# Patient Record
Sex: Male | Born: 1975
Health system: Southern US, Community
[De-identification: ages and names within clinical notes are randomized; demographics above are authoritative.]

## PROBLEM LIST (undated history)

## (undated) DIAGNOSIS — E669 Obesity, unspecified: Secondary | ICD-10-CM

## (undated) DIAGNOSIS — T7840XA Allergy, unspecified, initial encounter: Secondary | ICD-10-CM

## (undated) DIAGNOSIS — N189 Chronic kidney disease, unspecified: Secondary | ICD-10-CM

## (undated) DIAGNOSIS — Z86010 Personal history of colonic polyps: Secondary | ICD-10-CM

## (undated) DIAGNOSIS — E119 Type 2 diabetes mellitus without complications: Secondary | ICD-10-CM

## (undated) DIAGNOSIS — E785 Hyperlipidemia, unspecified: Secondary | ICD-10-CM

## (undated) DIAGNOSIS — M1990 Unspecified osteoarthritis, unspecified site: Secondary | ICD-10-CM

## (undated) DIAGNOSIS — I10 Essential (primary) hypertension: Secondary | ICD-10-CM

## (undated) DIAGNOSIS — K219 Gastro-esophageal reflux disease without esophagitis: Secondary | ICD-10-CM

## (undated) DIAGNOSIS — K3184 Gastroparesis: Secondary | ICD-10-CM

## (undated) DIAGNOSIS — E113599 Type 2 diabetes mellitus with proliferative diabetic retinopathy without macular edema, unspecified eye: Secondary | ICD-10-CM

## (undated) DIAGNOSIS — K529 Noninfective gastroenteritis and colitis, unspecified: Secondary | ICD-10-CM

## (undated) DIAGNOSIS — K2970 Gastritis, unspecified, without bleeding: Secondary | ICD-10-CM

## (undated) DIAGNOSIS — G709 Myoneural disorder, unspecified: Secondary | ICD-10-CM

## (undated) DIAGNOSIS — F329 Major depressive disorder, single episode, unspecified: Secondary | ICD-10-CM

## (undated) DIAGNOSIS — E1140 Type 2 diabetes mellitus with diabetic neuropathy, unspecified: Secondary | ICD-10-CM

## (undated) DIAGNOSIS — F32A Depression, unspecified: Secondary | ICD-10-CM

## (undated) HISTORY — PX: EYE SURGERY: SHX253

## (undated) HISTORY — DX: Depression, unspecified: F32.A

## (undated) HISTORY — PX: KNEE SURGERY: SHX244

## (undated) HISTORY — DX: Hyperlipidemia, unspecified: E78.5

## (undated) HISTORY — DX: Noninfective gastroenteritis and colitis, unspecified: K52.9

## (undated) HISTORY — DX: Type 2 diabetes mellitus with proliferative diabetic retinopathy without macular edema, unspecified eye: E11.3599

## (undated) HISTORY — DX: Chronic kidney disease, unspecified: N18.9

## (undated) HISTORY — DX: Allergy, unspecified, initial encounter: T78.40XA

## (undated) HISTORY — DX: Unspecified osteoarthritis, unspecified site: M19.90

## (undated) HISTORY — DX: Major depressive disorder, single episode, unspecified: F32.9

## (undated) HISTORY — DX: Myoneural disorder, unspecified: G70.9

## (undated) HISTORY — DX: Type 2 diabetes mellitus without complications: E11.9

## (undated) HISTORY — PX: CHOLECYSTECTOMY: SHX55

## (undated) HISTORY — DX: Gastroparesis: K31.84

## (undated) HISTORY — PX: KNEE ARTHROSCOPY W/ ACL RECONSTRUCTION: SHX1858

## (undated) HISTORY — DX: Essential (primary) hypertension: I10

---
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
## 2010-02-12 DIAGNOSIS — K3184 Gastroparesis: Secondary | ICD-10-CM

## 2010-02-12 HISTORY — DX: Gastroparesis: K31.84

## 2010-10-25 ENCOUNTER — Emergency Department
Admission: EM | Admit: 2010-10-25 | Disposition: A | Discharge status: Home / Self Care | Payer: Self-pay | Source: Emergency Department | Admitting: Emergency Medicine

## 2010-10-25 LAB — URINALYSIS
Bilirubin, UA: NEGATIVE
Leukocyte Esterase, UA: NEGATIVE
Nitrate: NEGATIVE
Protein, UR: NEGATIVE
Specific Gravity, UR: >1.035 — ABNORMAL HIGH (ref 1.000–1.035)
Urobilinogen, UA: NORMAL
pH, Urine: 6 (ref 5.0–8.0)

## 2010-10-25 LAB — BLOOD GAS ANALYSIS
Base Excess: -0.1 mmol/L (ref <=2.0)
FIO2: 21 %
HCO3, Arterial: 25.4 mmol/L (ref 22.0–28.0)
O2 Sat(c): 97 % (ref 95.0–100.0)
Patient Temp: 98.6
pCO2: 44 mmHg (ref 35–45)
pH: 7.37 (ref 7.35–7.45)
pO2: 96 mmHg (ref 80–100)

## 2010-10-25 LAB — CBC AND DIFFERENTIAL
BASOPHILS %: 0.1 % (ref 0.0–2.0)
Baso(Absolute): 0.01 10*3/uL (ref 0.00–0.20)
Eosinophils %: 0.2 % (ref 0.0–6.0)
Eosinophils Absolute: 0.02 10*3/uL — ABNORMAL LOW (ref 0.10–0.30)
Hematocrit: 43 % (ref 39.0–49.0)
Hemoglobin: 15 g/dL (ref 13.2–17.3)
Immature Granulocytes #: 0.04 10*3/uL (ref 0.00–0.05)
Immature Granulocytes %: 0.4 % — ABNORMAL HIGH (ref 0.0–0.0)
Lymphocytes Absolute: 1.83 10*3/uL (ref 1.00–4.80)
Lymphocytes Relative: 16.7 % — ABNORMAL LOW (ref 25.0–55.0)
MCH: 27.2 pg (ref 27.0–34.0)
MCHC: 34.9 g/dL (ref 32.0–36.0)
MCV: 78 fL — ABNORMAL LOW (ref 80–100)
MPV: 10.4 fL (ref 9.0–13.0)
Monocytes Absolute: 0.41 10*3/uL (ref 0.10–1.20)
Monocytes Relative %: 3.7 % (ref 1.0–8.0)
Neutrophils Absolute: 8.72 10*3/uL — ABNORMAL HIGH (ref 1.80–7.70)
Neutrophils Relative %: 79.3 % — ABNORMAL HIGH (ref 49.0–69.0)
Nucleated RBC %: 0 /100WBC (ref 0.0–0.0)
Nucleted RBC #: 0 10*3/uL (ref 0.00–0.00)
Platelets: 254 10*3/uL (ref 150–400)
RBC: 5.51 M/uL — ABNORMAL HIGH (ref 3.80–5.40)
RDW: 12.2 % (ref 11.0–14.0)
WBC: 10.99 10*3/uL — ABNORMAL HIGH (ref 4.80–10.80)

## 2010-10-25 LAB — COMPREHENSIVE METABOLIC PANEL
ALT: 39 U/L (ref 7–56)
AST (SGOT): 28 U/L (ref 5–40)
Albumin, Synovial: 4.3 g/dL (ref 3.9–5.0)
Alkaline Phosphatase: 124 U/L (ref 38–126)
BUN / Creatinine Ratio: 15 (ref 8–20)
BUN: 16 mg/dL (ref 6–20)
Bilirubin, Total: 1.1 mg/dL (ref 0.2–1.3)
CO2: 22 mmol/L (ref 21.0–31.0)
Calcium: 9.2 mg/dL (ref 8.4–10.2)
Chloride: 102 mmol/L (ref 101–111)
Creatinine: 1.07 mg/dL (ref 0.66–1.25)
EGFR: >60 mL/min/{1.73_m2}
EGFR: >60 mL/min/{1.73_m2}
Glucose: 348 mg/dL — ABNORMAL HIGH (ref 70–100)
Potassium: 4.6 mmol/L (ref 3.6–5.0)
Protein, Total: 7.3 g/dL (ref 6.3–8.2)
Sodium: 140 mmol/L (ref 135–145)

## 2010-10-25 LAB — URINALYSIS WITH MICROSCOPIC

## 2010-10-25 LAB — LIPASE: Lipase: 117 U/L (ref 23–300)

## 2010-10-27 ENCOUNTER — Inpatient Hospital Stay
Admission: EM | Admit: 2010-10-27 | Disposition: A | Discharge status: Home / Self Care | Payer: Self-pay | Source: Emergency Department | Admitting: Family Medicine

## 2010-10-27 LAB — CBC AND DIFFERENTIAL
BASOPHILS %: 0.2 % (ref 0.0–2.0)
Baso(Absolute): 0.02 10*3/uL (ref 0.00–0.20)
Eosinophils %: 0.7 % (ref 0.0–6.0)
Eosinophils Absolute: 0.07 10*3/uL — ABNORMAL LOW (ref 0.10–0.30)
Hematocrit: 43.2 % (ref 39.0–49.0)
Hemoglobin: 15.3 g/dL (ref 13.2–17.3)
Immature Granulocytes #: 0.03 10*3/uL (ref 0.00–0.05)
Immature Granulocytes %: 0.3 % — ABNORMAL HIGH (ref 0.0–0.0)
Lymphocytes Absolute: 2.17 10*3/uL (ref 1.00–4.80)
Lymphocytes Relative: 22.7 % — ABNORMAL LOW (ref 25.0–55.0)
MCH: 27.5 pg (ref 27.0–34.0)
MCHC: 35.4 g/dL (ref 32.0–36.0)
MCV: 77.6 fL — ABNORMAL LOW (ref 80–100)
MPV: 10.3 fL (ref 9.0–13.0)
Monocytes Absolute: 0.54 10*3/uL (ref 0.10–1.20)
Monocytes Relative %: 5.6 % (ref 1.0–8.0)
Neutrophils Absolute: 6.78 10*3/uL (ref 1.80–7.70)
Neutrophils Relative %: 70.8 % — ABNORMAL HIGH (ref 49.0–69.0)
Nucleated RBC %: 0 /100WBC (ref 0.0–0.0)
Nucleted RBC #: 0 10*3/uL (ref 0.00–0.00)
Platelets: 265 10*3/uL (ref 150–400)
RBC: 5.57 M/uL — ABNORMAL HIGH (ref 3.80–5.40)
RDW: 12 % (ref 11.0–14.0)
WBC: 9.58 10*3/uL (ref 4.80–10.80)

## 2010-10-27 LAB — URINALYSIS
Bilirubin, UA: NEGATIVE
Leukocyte Esterase, UA: NEGATIVE
Nitrate: NEGATIVE
Protein, UR: NEGATIVE
Specific Gravity, UR: >1.035 — ABNORMAL HIGH (ref 1.000–1.035)
Urobilinogen, UA: NORMAL
pH, Urine: 6 (ref 5.0–8.0)

## 2010-10-27 LAB — URINALYSIS WITH MICROSCOPIC

## 2010-10-27 LAB — COMPREHENSIVE METABOLIC PANEL
ALT: 54 U/L (ref 7–56)
AST (SGOT): 52 U/L — ABNORMAL HIGH (ref 5–40)
Albumin, Synovial: 4.6 g/dL (ref 3.9–5.0)
Alkaline Phosphatase: 154 U/L — ABNORMAL HIGH (ref 38–126)
BUN / Creatinine Ratio: 13 (ref 8–20)
BUN: 15 mg/dL (ref 6–20)
Bilirubin, Total: 0.8 mg/dL (ref 0.2–1.3)
CO2: 25 mmol/L (ref 21.0–31.0)
Calcium: 9.4 mg/dL (ref 8.4–10.2)
Chloride: 98 mmol/L — ABNORMAL LOW (ref 101–111)
Creatinine: 1.16 mg/dL (ref 0.66–1.25)
EGFR: >60 mL/min/{1.73_m2}
EGFR: >60 mL/min/{1.73_m2}
Glucose: 403 mg/dL — ABNORMAL HIGH (ref 70–100)
Potassium: 4.3 mmol/L (ref 3.6–5.0)
Protein, Total: 7.8 g/dL (ref 6.3–8.2)
Sodium: 137 mmol/L (ref 135–145)

## 2010-10-27 LAB — TOXICOLOGY SCREEN, SERUM
Barbiturate Screen, UR: NEGATIVE
Benzodiazepine Screen, UR: NEGATIVE
Cocaine Screen: NEGATIVE
Opiate Screen: NEGATIVE
PCP Screen: NEGATIVE
THC Screen: NEGATIVE
Urine Amphetamine Screen: NEGATIVE

## 2010-10-27 LAB — TROPONIN I: Troponin I: <0.012 ng/mL (ref 0.000–0.034)

## 2010-10-27 LAB — MYOGLOBIN, SERUM: Myoglobin: 330.5 ng/mL — ABNORMAL HIGH (ref 0.0–121.0)

## 2010-10-27 LAB — LIPASE: Lipase: 272 U/L (ref 23–300)

## 2010-10-27 LAB — CK TOTAL AND CKMB: CKMB Mass: 3.34 ng/mL (ref 0.00–3.38)

## 2010-10-27 LAB — MAGNESIUM: Magnesium: 2.1 mg/dL (ref 1.7–2.2)

## 2010-10-27 LAB — CK: Creatine Kinase (CK): 2418 U/L — ABNORMAL HIGH (ref 19–216)

## 2010-10-28 LAB — URINALYSIS
Bilirubin, UA: NEGATIVE
Blood, UA: NEGATIVE
Leukocyte Esterase, UA: NEGATIVE
Nitrate: NEGATIVE
Protein, UR: NEGATIVE
Specific Gravity, UR: >1.035 — ABNORMAL HIGH (ref 1.000–1.035)
Urobilinogen, UA: NORMAL
pH, Urine: 5.5 (ref 5.0–8.0)

## 2010-10-28 LAB — COMPREHENSIVE METABOLIC PANEL
ALT: 41 U/L (ref 7–56)
AST (SGOT): 27 U/L (ref 5–40)
Albumin, Synovial: 2.8 g/dL — ABNORMAL LOW (ref 3.9–5.0)
Alkaline Phosphatase: 71 U/L (ref 38–126)
BUN / Creatinine Ratio: 15 (ref 8–20)
BUN: 14 mg/dL (ref 6–20)
Bilirubin, Total: 0.8 mg/dL (ref 0.2–1.3)
CO2: 27 mmol/L (ref 21.0–31.0)
Calcium: 7.8 mg/dL — ABNORMAL LOW (ref 8.4–10.2)
Chloride: 108 mmol/L (ref 101–111)
Creatinine: 0.96 mg/dL (ref 0.66–1.25)
EGFR: >60 mL/min/{1.73_m2}
EGFR: >60 mL/min/{1.73_m2}
Glucose: 171 mg/dL — ABNORMAL HIGH (ref 70–100)
Potassium: 4 mmol/L (ref 3.6–5.0)
Protein, Total: 5.6 g/dL — ABNORMAL LOW (ref 6.3–8.2)
Sodium: 141 mmol/L (ref 135–145)

## 2010-10-28 LAB — CK: Creatine Kinase (CK): 647 U/L — ABNORMAL HIGH (ref 19–216)

## 2010-10-28 LAB — CBC
Hematocrit: 38.8 % — ABNORMAL LOW (ref 39.0–49.0)
Hemoglobin: 13 g/dL — ABNORMAL LOW (ref 13.2–17.3)
MCH: 26.6 pg — ABNORMAL LOW (ref 27.0–34.0)
MCHC: 33.5 g/dL (ref 32.0–36.0)
MCV: 79.5 fL — ABNORMAL LOW (ref 80–100)
MPV: 10.8 fL (ref 9.0–13.0)
Nucleated RBC %: 0 /100WBC (ref 0.0–0.0)
Nucleted RBC #: 0 10*3/uL (ref 0.00–0.00)
Platelets: 237 10*3/uL (ref 150–400)
RBC: 4.88 M/uL (ref 3.80–5.40)
RDW: 12.2 % (ref 11.0–14.0)
WBC: 8.63 10*3/uL (ref 4.80–10.80)

## 2010-10-28 LAB — MYOGLOBIN, SERUM: Myoglobin: 65.9 ng/mL (ref 0.0–121.0)

## 2010-10-28 LAB — URIC ACID: Uric acid: 5.6 mg/dL (ref 3.4–7.3)

## 2010-10-29 LAB — CBC
Hematocrit: 39.9 % (ref 39.0–49.0)
Hemoglobin: 13.3 g/dL (ref 13.2–17.3)
MCH: 26.4 pg — ABNORMAL LOW (ref 27.0–34.0)
MCHC: 33.3 g/dL (ref 32.0–36.0)
MCV: 79.2 fL — ABNORMAL LOW (ref 80–100)
MPV: 10.4 fL (ref 9.0–13.0)
Nucleated RBC %: 0 /100WBC (ref 0.0–0.0)
Nucleted RBC #: 0 10*3/uL (ref 0.00–0.00)
Platelets: 249 10*3/uL (ref 150–400)
RBC: 5.04 M/uL (ref 3.80–5.40)
RDW: 12.3 % (ref 11.0–14.0)
WBC: 8.01 10*3/uL (ref 4.80–10.80)

## 2010-10-29 LAB — COMPREHENSIVE METABOLIC PANEL
ALT: 41 U/L (ref 7–56)
AST (SGOT): 20 U/L (ref 5–40)
Albumin, Synovial: 2.8 g/dL — ABNORMAL LOW (ref 3.9–5.0)
Alkaline Phosphatase: 71 U/L (ref 38–126)
BUN / Creatinine Ratio: 18 (ref 8–20)
BUN: 18 mg/dL (ref 6–20)
Bilirubin, Total: 0.7 mg/dL (ref 0.2–1.3)
CO2: 26 mmol/L (ref 21.0–31.0)
Calcium: 7.8 mg/dL — ABNORMAL LOW (ref 8.4–10.2)
Chloride: 109 mmol/L (ref 101–111)
Creatinine: 0.99 mg/dL (ref 0.66–1.25)
EGFR: >60 mL/min/{1.73_m2}
EGFR: >60 mL/min/{1.73_m2}
Glucose: 165 mg/dL — ABNORMAL HIGH (ref 70–100)
Potassium: 4.1 mmol/L (ref 3.6–5.0)
Protein, Total: 5.6 g/dL — ABNORMAL LOW (ref 6.3–8.2)
Sodium: 142 mmol/L (ref 135–145)

## 2010-10-29 LAB — CK: Creatine Kinase (CK): 278 U/L — ABNORMAL HIGH (ref 19–216)

## 2010-10-29 LAB — URINE MYOGLOBIN: Myoglobin, UR: <1 mg/L (ref 0–1)

## 2010-10-29 LAB — AMYLASE: Amylase: <30 U/L (ref 29–110)

## 2010-10-29 LAB — LIPASE: Lipase: 154 U/L (ref 23–300)

## 2010-10-30 LAB — H. PYLORI (C-13), BLOOD: Helicobacter pylori Screen: NEGATIVE

## 2010-11-21 ENCOUNTER — Inpatient Hospital Stay
Admission: EM | Admit: 2010-11-21 | Disposition: A | Discharge status: Home / Self Care | Payer: Self-pay | Source: Emergency Department | Attending: Internal Medicine | Admitting: Internal Medicine

## 2010-11-21 LAB — CBC AND DIFFERENTIAL
BASOPHILS %: 0.1 % (ref 0.0–2.0)
Baso(Absolute): 0.01 10*3/uL (ref 0.00–0.20)
Eosinophils %: 0 % (ref 0.0–6.0)
Eosinophils Absolute: 0 10*3/uL — ABNORMAL LOW (ref 0.10–0.30)
Hematocrit: 49 % (ref 39.0–49.0)
Hemoglobin: 17.1 g/dL (ref 13.2–17.3)
Immature Granulocytes #: 0.07 10*3/uL — ABNORMAL HIGH (ref 0.00–0.05)
Immature Granulocytes %: 0.5 % — ABNORMAL HIGH (ref 0.0–0.0)
Lymphocytes Absolute: 1.55 10*3/uL (ref 1.00–4.80)
Lymphocytes Relative: 10.7 % — ABNORMAL LOW (ref 25.0–55.0)
MCH: 27.5 pg (ref 27.0–34.0)
MCHC: 34.9 g/dL (ref 32.0–36.0)
MCV: 78.9 fL — ABNORMAL LOW (ref 80–100)
MPV: 10.5 fL (ref 9.0–13.0)
Monocytes Absolute: 0.69 10*3/uL (ref 0.10–1.20)
Monocytes Relative %: 4.7 % (ref 1.0–8.0)
Neutrophils Absolute: 12.3 10*3/uL — ABNORMAL HIGH (ref 1.80–7.70)
Neutrophils Relative %: 84.5 % — ABNORMAL HIGH (ref 49.0–69.0)
Nucleated RBC %: 0 /100WBC (ref 0.0–0.0)
Nucleted RBC #: 0 10*3/uL (ref 0.00–0.00)
Platelets: 305 10*3/uL (ref 150–400)
RBC: 6.21 M/uL — ABNORMAL HIGH (ref 3.80–5.40)
RDW: 12.4 % (ref 11.0–14.0)
WBC: 14.55 10*3/uL — ABNORMAL HIGH (ref 4.80–10.80)

## 2010-11-21 LAB — PT (PROTHROMBIN TIME)
PT INR: 1
PT: 13.1 s (ref 12.6–15.0)

## 2010-11-21 LAB — COMPREHENSIVE METABOLIC PANEL
ALT: 37 U/L (ref 7–56)
AST (SGOT): 17 U/L (ref 5–40)
Albumin, Synovial: 4.7 g/dL (ref 3.9–5.0)
Alkaline Phosphatase: 130 U/L — ABNORMAL HIGH (ref 38–126)
BUN / Creatinine Ratio: 18 (ref 8–20)
BUN: 27 mg/dL — ABNORMAL HIGH (ref 6–20)
Bilirubin, Total: 0.9 mg/dL (ref 0.2–1.3)
CO2: 22 mmol/L (ref 21.0–31.0)
Calcium: 10.1 mg/dL (ref 8.4–10.2)
Chloride: 102 mmol/L (ref 101–111)
Creatinine: 1.48 mg/dL — ABNORMAL HIGH (ref 0.66–1.25)
EGFR: 54 mL/min/{1.73_m2}
EGFR: >60 mL/min/{1.73_m2}
Glucose: 424 mg/dL — ABNORMAL HIGH (ref 70–100)
Potassium: 4.6 mmol/L (ref 3.6–5.0)
Protein, Total: 8.3 g/dL — ABNORMAL HIGH (ref 6.3–8.2)
Sodium: 142 mmol/L (ref 135–145)

## 2010-11-21 LAB — CK: Creatine Kinase (CK): 120 U/L (ref 19–216)

## 2010-11-21 LAB — PTT (PARTIAL THROMBOPLASTIN TIME): PTT: 23.6 s (ref 23.0–37.0)

## 2010-11-21 LAB — LIPASE: Lipase: 122 U/L (ref 23–300)

## 2010-11-21 LAB — TROPONIN I: Troponin I: <0.012 ng/mL (ref 0.000–0.034)

## 2010-11-21 LAB — ALCOHOL, SERUM: Alcohol Screen Serum: <10 mg/dL (ref 0–50)

## 2010-11-22 LAB — URINALYSIS
Bilirubin, UA: NEGATIVE
Blood, UA: NEGATIVE
Leukocyte Esterase, UA: NEGATIVE
Nitrate: NEGATIVE
Protein, UR: NEGATIVE
Specific Gravity, UR: >1.035 — ABNORMAL HIGH (ref 1.000–1.035)
Urobilinogen, UA: NORMAL
pH, Urine: 5.5 (ref 5.0–8.0)

## 2010-11-22 LAB — CBC
Hematocrit: 41.6 % (ref 39.0–49.0)
Hemoglobin: 14.1 g/dL (ref 13.2–17.3)
MCH: 27 pg (ref 27.0–34.0)
MCHC: 33.9 g/dL (ref 32.0–36.0)
MCV: 79.5 fL — ABNORMAL LOW (ref 80–100)
MPV: 10 fL (ref 9.0–13.0)
Nucleated RBC %: 0 /100WBC (ref 0.0–0.0)
Nucleted RBC #: 0 10*3/uL (ref 0.00–0.00)
Platelets: 260 10*3/uL (ref 150–400)
RBC: 5.23 M/uL (ref 3.80–5.40)
RDW: 12.5 % (ref 11.0–14.0)
WBC: 12.8 10*3/uL — ABNORMAL HIGH (ref 4.80–10.80)

## 2010-11-22 LAB — BASIC METABOLIC PANEL
BUN / Creatinine Ratio: 23 — ABNORMAL HIGH (ref 8–20)
BUN: 27 mg/dL — ABNORMAL HIGH (ref 6–20)
CO2: 23 mmol/L (ref 21.0–31.0)
Calcium: 8.3 mg/dL — ABNORMAL LOW (ref 8.4–10.2)
Chloride: 113 mmol/L — ABNORMAL HIGH (ref 101–111)
Creatinine: 1.16 mg/dL (ref 0.66–1.25)
EGFR: >60 mL/min/{1.73_m2}
EGFR: >60 mL/min/{1.73_m2}
Glucose: 193 mg/dL — ABNORMAL HIGH (ref 70–100)
Potassium: 4.2 mmol/L (ref 3.6–5.0)
Sodium: 145 mmol/L (ref 135–145)

## 2010-11-22 LAB — MAGNESIUM: Magnesium: 2.1 mg/dL (ref 1.7–2.2)

## 2010-11-22 LAB — CK: Creatine Kinase (CK): 106 U/L (ref 19–216)

## 2010-11-22 LAB — TROPONIN I: Troponin I: <0.012 ng/mL (ref 0.000–0.034)

## 2010-11-23 LAB — COMPREHENSIVE METABOLIC PANEL
ALT: 31 U/L (ref 7–56)
AST (SGOT): 12 U/L (ref 5–40)
Albumin, Synovial: 3.2 g/dL — ABNORMAL LOW (ref 3.9–5.0)
Alkaline Phosphatase: 77 U/L (ref 38–126)
BUN / Creatinine Ratio: 17 (ref 8–20)
BUN: 15 mg/dL (ref 6–20)
Bilirubin, Total: 0.7 mg/dL (ref 0.2–1.3)
CO2: 25 mmol/L (ref 21.0–31.0)
Calcium: 8.3 mg/dL — ABNORMAL LOW (ref 8.4–10.2)
Chloride: 109 mmol/L (ref 101–111)
Creatinine: 0.92 mg/dL (ref 0.66–1.25)
EGFR: >60 mL/min/{1.73_m2}
EGFR: >60 mL/min/{1.73_m2}
Glucose: 176 mg/dL — ABNORMAL HIGH (ref 70–100)
Potassium: 4.1 mmol/L (ref 3.6–5.0)
Protein, Total: 6.2 g/dL — ABNORMAL LOW (ref 6.3–8.2)
Sodium: 144 mmol/L (ref 135–145)

## 2010-11-23 LAB — PHOSPHORUS: Phosphorus: 3.4 mg/dL (ref 2.4–4.4)

## 2010-11-23 LAB — LIPASE: Lipase: 158 U/L (ref 23–300)

## 2010-11-23 LAB — MAGNESIUM: Magnesium: 1.9 mg/dL (ref 1.7–2.2)

## 2010-11-24 LAB — BASIC METABOLIC PANEL
BUN / Creatinine Ratio: 12 (ref 8–20)
BUN: 11 mg/dL (ref 6–20)
CO2: 23 mmol/L (ref 21.0–31.0)
Calcium: 8.6 mg/dL (ref 8.4–10.2)
Chloride: 107 mmol/L (ref 101–111)
Creatinine: 0.93 mg/dL (ref 0.66–1.25)
EGFR: >60 mL/min/{1.73_m2}
EGFR: >60 mL/min/{1.73_m2}
Glucose: 181 mg/dL — ABNORMAL HIGH (ref 70–100)
Potassium: 4.1 mmol/L (ref 3.6–5.0)
Sodium: 142 mmol/L (ref 135–145)

## 2010-11-24 LAB — CBC AND DIFFERENTIAL
BASOPHILS %: 0.3 % (ref 0.0–2.0)
Baso(Absolute): 0.02 10*3/uL (ref 0.00–0.20)
Eosinophils %: 0.1 % (ref 0.0–6.0)
Eosinophils Absolute: 0.01 10*3/uL — ABNORMAL LOW (ref 0.10–0.30)
Hematocrit: 43.3 % (ref 39.0–49.0)
Hemoglobin: 14.6 g/dL (ref 13.2–17.3)
Immature Granulocytes #: 0.02 10*3/uL (ref 0.00–0.05)
Immature Granulocytes %: 0.3 % — ABNORMAL HIGH (ref 0.0–0.0)
Lymphocytes Absolute: 1.97 10*3/uL (ref 1.00–4.80)
Lymphocytes Relative: 28 % (ref 25.0–55.0)
MCH: 27 pg (ref 27.0–34.0)
MCHC: 33.7 g/dL (ref 32.0–36.0)
MCV: 80.2 fL (ref 80–100)
MPV: 10.2 fL (ref 9.0–13.0)
Monocytes Absolute: 0.32 10*3/uL (ref 0.10–1.20)
Monocytes Relative %: 4.5 % (ref 1.0–8.0)
Neutrophils Absolute: 4.72 10*3/uL (ref 1.80–7.70)
Neutrophils Relative %: 67.1 % (ref 49.0–69.0)
Nucleated RBC %: 0 /100WBC (ref 0.0–0.0)
Nucleted RBC #: 0 10*3/uL (ref 0.00–0.00)
Platelets: 236 10*3/uL (ref 150–400)
RBC: 5.4 M/uL (ref 3.80–5.40)
RDW: 12.2 % (ref 11.0–14.0)
WBC: 7.04 10*3/uL (ref 4.80–10.80)

## 2010-11-24 LAB — GLYCOHEMOGLOBIN
Estimated Average Glucose: 338 mg/dL
Hemoglobin A1C: 13.4 % — ABNORMAL HIGH (ref 4.0–5.6)

## 2010-11-24 LAB — MAGNESIUM: Magnesium: 1.8 mg/dL (ref 1.7–2.2)

## 2010-11-27 LAB — CBC
Hematocrit: 39.1 % (ref 39.0–49.0)
Hemoglobin: 13.8 g/dL (ref 13.2–17.3)
MCH: 27 pg (ref 27.0–34.0)
MCHC: 35.3 g/dL (ref 32.0–36.0)
MCV: 76.5 fL — ABNORMAL LOW (ref 80–100)
MPV: 9.9 fL (ref 9.0–13.0)
Nucleated RBC %: 0 /100WBC (ref 0.0–0.0)
Nucleted RBC #: 0 10*3/uL (ref 0.00–0.00)
Platelets: 211 10*3/uL (ref 150–400)
RBC: 5.11 M/uL (ref 3.80–5.40)
RDW: 11.7 % (ref 11.0–14.0)
WBC: 6.28 10*3/uL (ref 4.80–10.80)

## 2010-11-27 LAB — COMPREHENSIVE METABOLIC PANEL
ALT: 33 U/L (ref 7–56)
AST (SGOT): 21 U/L (ref 5–40)
Albumin, Synovial: 3 g/dL — ABNORMAL LOW (ref 3.9–5.0)
Alkaline Phosphatase: 64 U/L (ref 38–126)
BUN / Creatinine Ratio: 10 (ref 8–20)
BUN: 8 mg/dL (ref 6–20)
Bilirubin, Total: 0.9 mg/dL (ref 0.2–1.3)
CO2: 26 mmol/L (ref 21.0–31.0)
Calcium: 8.6 mg/dL (ref 8.4–10.2)
Chloride: 98 mmol/L — ABNORMAL LOW (ref 101–111)
Creatinine: 0.86 mg/dL (ref 0.66–1.25)
EGFR: >60 mL/min/{1.73_m2}
EGFR: >60 mL/min/{1.73_m2}
Glucose: 109 mg/dL — ABNORMAL HIGH (ref 70–100)
Potassium: 3.1 mmol/L — ABNORMAL LOW (ref 3.6–5.0)
Protein, Total: 5.9 g/dL — ABNORMAL LOW (ref 6.3–8.2)
Sodium: 134 mmol/L — ABNORMAL LOW (ref 135–145)

## 2010-11-27 LAB — PHOSPHORUS: Phosphorus: 3.5 mg/dL (ref 2.4–4.4)

## 2010-11-27 LAB — MAGNESIUM: Magnesium: 1.6 mg/dL — ABNORMAL LOW (ref 1.7–2.2)

## 2010-11-28 LAB — BASIC METABOLIC PANEL
BUN / Creatinine Ratio: 9 (ref 8–20)
BUN: 9 mg/dL (ref 6–20)
CO2: 26 mmol/L (ref 21.0–31.0)
Calcium: 8.8 mg/dL (ref 8.4–10.2)
Chloride: 96 mmol/L — ABNORMAL LOW (ref 101–111)
Creatinine: 1.02 mg/dL (ref 0.66–1.25)
EGFR: >60 mL/min/{1.73_m2}
EGFR: >60 mL/min/{1.73_m2}
Glucose: 207 mg/dL — ABNORMAL HIGH (ref 70–100)
Potassium: 3.2 mmol/L — ABNORMAL LOW (ref 3.6–5.0)
Sodium: 134 mmol/L — ABNORMAL LOW (ref 135–145)

## 2010-11-29 LAB — CBC AND DIFFERENTIAL
BASOPHILS %: 0.3 % (ref 0.0–2.0)
Baso(Absolute): 0.02 10*3/uL (ref 0.00–0.20)
Eosinophils %: 2.9 % (ref 0.0–6.0)
Eosinophils Absolute: 0.22 10*3/uL (ref 0.10–0.30)
Hematocrit: 40.9 % (ref 39.0–49.0)
Hemoglobin: 14.4 g/dL (ref 13.2–17.3)
Immature Granulocytes #: 0.03 10*3/uL (ref 0.00–0.05)
Immature Granulocytes %: 0.4 % — ABNORMAL HIGH (ref 0.0–0.0)
Lymphocytes Absolute: 1.45 10*3/uL (ref 1.00–4.80)
Lymphocytes Relative: 19.2 % — ABNORMAL LOW (ref 25.0–55.0)
MCH: 26.9 pg — ABNORMAL LOW (ref 27.0–34.0)
MCHC: 35.2 g/dL (ref 32.0–36.0)
MCV: 76.3 fL — ABNORMAL LOW (ref 80–100)
MPV: 10.6 fL (ref 9.0–13.0)
Monocytes Absolute: 0.56 10*3/uL (ref 0.10–1.20)
Monocytes Relative %: 7.4 % (ref 1.0–8.0)
Neutrophils Absolute: 5.32 10*3/uL (ref 1.80–7.70)
Neutrophils Relative %: 70.2 % — ABNORMAL HIGH (ref 49.0–69.0)
Nucleated RBC %: 0 /100WBC (ref 0.0–0.0)
Nucleted RBC #: 0 10*3/uL (ref 0.00–0.00)
Platelets: 231 10*3/uL (ref 150–400)
RBC: 5.36 M/uL (ref 3.80–5.40)
RDW: 11.9 % (ref 11.0–14.0)
WBC: 7.57 10*3/uL (ref 4.80–10.80)

## 2010-11-29 LAB — COMPREHENSIVE METABOLIC PANEL
ALT: 38 U/L (ref 7–56)
AST (SGOT): 26 U/L (ref 5–40)
Albumin, Synovial: 3.1 g/dL — ABNORMAL LOW (ref 3.9–5.0)
Alkaline Phosphatase: 69 U/L (ref 38–126)
BUN / Creatinine Ratio: 8 (ref 8–20)
BUN: 9 mg/dL (ref 6–20)
Bilirubin, Total: 0.7 mg/dL (ref 0.2–1.3)
CO2: 28 mmol/L (ref 21.0–31.0)
Calcium: 8.7 mg/dL (ref 8.4–10.2)
Chloride: 97 mmol/L — ABNORMAL LOW (ref 101–111)
Creatinine: 1.05 mg/dL (ref 0.66–1.25)
EGFR: >60 mL/min/{1.73_m2}
EGFR: >60 mL/min/{1.73_m2}
Glucose: 151 mg/dL — ABNORMAL HIGH (ref 70–100)
Potassium: 3.5 mmol/L — ABNORMAL LOW (ref 3.6–5.0)
Protein, Total: 6.1 g/dL — ABNORMAL LOW (ref 6.3–8.2)
Sodium: 137 mmol/L (ref 135–145)

## 2010-11-29 LAB — PHOSPHORUS: Phosphorus: 3.8 mg/dL (ref 2.4–4.4)

## 2010-11-29 LAB — MAGNESIUM: Magnesium: 1.7 mg/dL (ref 1.7–2.2)

## 2010-11-29 LAB — AMYLASE: Amylase: 32 U/L (ref 29–110)

## 2010-11-29 LAB — LIPASE: Lipase: 187 U/L (ref 23–300)

## 2010-11-30 LAB — CBC AND DIFFERENTIAL
BASOPHILS %: 0.5 % (ref 0.0–2.0)
Baso(Absolute): 0.03 10*3/uL (ref 0.00–0.20)
Eosinophils %: 3.4 % (ref 0.0–6.0)
Eosinophils Absolute: 0.22 10*3/uL (ref 0.10–0.30)
Hematocrit: 42.5 % (ref 39.0–49.0)
Hemoglobin: 14.7 g/dL (ref 13.2–17.3)
Immature Granulocytes #: 0.04 10*3/uL (ref 0.00–0.05)
Immature Granulocytes %: 0.6 % — ABNORMAL HIGH (ref 0.0–0.0)
Lymphocytes Absolute: 2.02 10*3/uL (ref 1.00–4.80)
Lymphocytes Relative: 31.1 % (ref 25.0–55.0)
MCH: 26.8 pg — ABNORMAL LOW (ref 27.0–34.0)
MCHC: 34.6 g/dL (ref 32.0–36.0)
MCV: 77.6 fL — ABNORMAL LOW (ref 80–100)
MPV: 9.9 fL (ref 9.0–13.0)
Monocytes Absolute: 0.67 10*3/uL (ref 0.10–1.20)
Monocytes Relative %: 10.3 % — ABNORMAL HIGH (ref 1.0–8.0)
Neutrophils Absolute: 3.55 10*3/uL (ref 1.80–7.70)
Neutrophils Relative %: 54.7 % (ref 49.0–69.0)
Nucleated RBC %: 0 /100WBC (ref 0.0–0.0)
Nucleted RBC #: 0 10*3/uL (ref 0.00–0.00)
Platelets: 222 10*3/uL (ref 150–400)
RBC: 5.48 M/uL — ABNORMAL HIGH (ref 3.80–5.40)
RDW: 12.1 % (ref 11.0–14.0)
WBC: 6.49 10*3/uL (ref 4.80–10.80)

## 2010-11-30 LAB — BASIC METABOLIC PANEL
BUN / Creatinine Ratio: 9 (ref 8–20)
BUN: 9 mg/dL (ref 6–20)
CO2: 27 mmol/L (ref 21.0–31.0)
Calcium: 8.8 mg/dL (ref 8.4–10.2)
Chloride: 100 mmol/L — ABNORMAL LOW (ref 101–111)
Creatinine: 1.01 mg/dL (ref 0.66–1.25)
EGFR: >60 mL/min/{1.73_m2}
EGFR: >60 mL/min/{1.73_m2}
Glucose: 107 mg/dL — ABNORMAL HIGH (ref 70–100)
Potassium: 3.6 mmol/L (ref 3.6–5.0)
Sodium: 136 mmol/L (ref 135–145)

## 2010-11-30 LAB — MAGNESIUM: Magnesium: 1.6 mg/dL — ABNORMAL LOW (ref 1.7–2.2)

## 2010-12-04 ENCOUNTER — Inpatient Hospital Stay
Admission: EM | Admit: 2010-12-04 | Disposition: A | Discharge status: Home / Self Care | Payer: Self-pay | Source: Emergency Department | Admitting: Internal Medicine

## 2010-12-04 LAB — LIPASE: Lipase: 205 U/L (ref 23–300)

## 2010-12-04 LAB — COMPREHENSIVE METABOLIC PANEL
ALT: 37 U/L (ref 7–56)
AST (SGOT): 18 U/L (ref 5–40)
Albumin, Synovial: 4.7 g/dL (ref 3.9–5.0)
Alkaline Phosphatase: 124 U/L (ref 38–126)
BUN / Creatinine Ratio: 11 (ref 8–20)
BUN: 13 mg/dL (ref 6–20)
Bilirubin, Total: 0.7 mg/dL (ref 0.2–1.3)
CO2: 29 mmol/L (ref 21.0–31.0)
Calcium: 10.2 mg/dL (ref 8.4–10.2)
Chloride: 93 mmol/L — ABNORMAL LOW (ref 101–111)
Creatinine: 1.15 mg/dL (ref 0.66–1.25)
EGFR: >60 mL/min/{1.73_m2}
EGFR: >60 mL/min/{1.73_m2}
Glucose: 370 mg/dL — ABNORMAL HIGH (ref 70–100)
Potassium: 5.6 mmol/L — ABNORMAL HIGH (ref 3.6–5.0)
Protein, Total: 8.2 g/dL (ref 6.3–8.2)
Sodium: 137 mmol/L (ref 135–145)

## 2010-12-04 LAB — CBC AND DIFFERENTIAL
BASOPHILS %: 0.4 % (ref 0.0–2.0)
Baso(Absolute): 0.04 10*3/uL (ref 0.00–0.20)
Eosinophils %: 0 % (ref 0.0–6.0)
Eosinophils Absolute: 0 10*3/uL — ABNORMAL LOW (ref 0.10–0.30)
Hematocrit: 48.5 % (ref 39.0–49.0)
Hemoglobin: 16.6 g/dL (ref 13.2–17.3)
Immature Granulocytes #: 0.08 10*3/uL — ABNORMAL HIGH (ref 0.00–0.05)
Immature Granulocytes %: 0.8 % — ABNORMAL HIGH (ref 0.0–0.0)
Lymphocytes Absolute: 1.43 10*3/uL (ref 1.00–4.80)
Lymphocytes Relative: 14.1 % — ABNORMAL LOW (ref 25.0–55.0)
MCH: 26.9 pg — ABNORMAL LOW (ref 27.0–34.0)
MCHC: 34.2 g/dL (ref 32.0–36.0)
MCV: 78.7 fL — ABNORMAL LOW (ref 80–100)
MPV: 10.7 fL (ref 9.0–13.0)
Monocytes Absolute: 0.41 10*3/uL (ref 0.10–1.20)
Monocytes Relative %: 4.1 % (ref 1.0–8.0)
Neutrophils Absolute: 8.24 10*3/uL — ABNORMAL HIGH (ref 1.80–7.70)
Neutrophils Relative %: 81.4 % — ABNORMAL HIGH (ref 49.0–69.0)
Nucleated RBC %: 0 /100WBC (ref 0.0–0.0)
Nucleted RBC #: 0 10*3/uL (ref 0.00–0.00)
Platelets: 312 10*3/uL (ref 150–400)
RBC: 6.16 M/uL — ABNORMAL HIGH (ref 3.80–5.40)
RDW: 12.5 % (ref 11.0–14.0)
WBC: 10.12 10*3/uL (ref 4.80–10.80)

## 2010-12-04 LAB — URINALYSIS
Bilirubin, UA: NEGATIVE
Blood, UA: NEGATIVE
Leukocyte Esterase, UA: NEGATIVE
Nitrate: NEGATIVE
Protein, UR: NEGATIVE
Specific Gravity, UR: 1.031 (ref 1.000–1.035)
Urobilinogen, UA: NORMAL
pH, Urine: 7.5 (ref 5.0–8.0)

## 2010-12-05 LAB — COMPREHENSIVE METABOLIC PANEL
ALT: 31 U/L (ref 7–56)
AST (SGOT): 12 U/L (ref 5–40)
Albumin, Synovial: 3.4 g/dL — ABNORMAL LOW (ref 3.9–5.0)
Alkaline Phosphatase: 77 U/L (ref 38–126)
BUN / Creatinine Ratio: 12 (ref 8–20)
BUN: 12 mg/dL (ref 6–20)
Bilirubin, Total: 0.7 mg/dL (ref 0.2–1.3)
CO2: 27 mmol/L (ref 21.0–31.0)
Calcium: 8.9 mg/dL (ref 8.4–10.2)
Chloride: 102 mmol/L (ref 101–111)
Creatinine: 1 mg/dL (ref 0.66–1.25)
EGFR: >60 mL/min/{1.73_m2}
EGFR: >60 mL/min/{1.73_m2}
Glucose: 202 mg/dL — ABNORMAL HIGH (ref 70–100)
Potassium: 3.9 mmol/L (ref 3.6–5.0)
Protein, Total: 6.1 g/dL — ABNORMAL LOW (ref 6.3–8.2)
Sodium: 140 mmol/L (ref 135–145)

## 2010-12-05 LAB — CBC
Hematocrit: 42 % (ref 39.0–49.0)
Hemoglobin: 14.1 g/dL (ref 13.2–17.3)
MCH: 26.9 pg — ABNORMAL LOW (ref 27.0–34.0)
MCHC: 33.6 g/dL (ref 32.0–36.0)
MCV: 80 fL (ref 80–100)
MPV: 10.1 fL (ref 9.0–13.0)
Nucleated RBC %: 0 /100WBC (ref 0.0–0.0)
Nucleted RBC #: 0 10*3/uL (ref 0.00–0.00)
Platelets: 325 10*3/uL (ref 150–400)
RBC: 5.25 M/uL (ref 3.80–5.40)
RDW: 12.7 % (ref 11.0–14.0)
WBC: 10.94 10*3/uL — ABNORMAL HIGH (ref 4.80–10.80)

## 2010-12-05 LAB — MAGNESIUM: Magnesium: 1.9 mg/dL (ref 1.7–2.2)

## 2010-12-06 LAB — COMPREHENSIVE METABOLIC PANEL
ALT: 34 U/L (ref 7–56)
AST (SGOT): 13 U/L (ref 5–40)
Albumin, Synovial: 2.8 g/dL — ABNORMAL LOW (ref 3.9–5.0)
Alkaline Phosphatase: 60 U/L (ref 38–126)
BUN / Creatinine Ratio: 10 (ref 8–20)
BUN: 10 mg/dL (ref 6–20)
Bilirubin, Total: 0.6 mg/dL (ref 0.2–1.3)
CO2: 26 mmol/L (ref 21.0–31.0)
Calcium: 8.7 mg/dL (ref 8.4–10.2)
Chloride: 105 mmol/L (ref 101–111)
Creatinine: 1.03 mg/dL (ref 0.66–1.25)
EGFR: >60 mL/min/{1.73_m2}
EGFR: >60 mL/min/{1.73_m2}
Glucose: 71 mg/dL (ref 70–100)
Potassium: 3.8 mmol/L (ref 3.6–5.0)
Protein, Total: 5.5 g/dL — ABNORMAL LOW (ref 6.3–8.2)
Sodium: 139 mmol/L (ref 135–145)

## 2010-12-06 NOTE — Consults (Signed)
[name], [name]                                                    MRN:          [id number]                                                          Account:      [id number]                                                     Document ID:  [id number]                                               Service Date: 11/25/2010                                                                                    MRN: [id number]  Document ID: [id number]  Admit Date: 11/21/2010     Patient Location: [id number]   Patient Type: I     CONSULTING PHYSICIAN: Catheryn Bacon CRENSHAW MD     REFERRING PHYSICIAN:         IDENTIFICATION AND CHIEF COMPLAINT:  This is a 35 year old African-American male, patient of Dr. Carlisle Cater,  who I am being asked to see for recurrent nausea and vomiting as well as  epigastric pain.     HISTORY OF PRESENT ILLNESS:  The patient has a history of diabetes and admits to noncompliance with  therapy including insulin as well as lisinopril.  He did not take these  medications.  He was admitted with DKA, on November 21, 2010.  He has been  experiencing epigastric pain, nausea and vomiting since admission.  He also  describes chest pain, shortness of breath, and chills.  Otherwise, no  hematemesis, dysphagia, odynophagia, diarrhea, hematochezia, black stool,  hematuria, or dysuria.       EGD by Dr. Welton Flakes on October 29, 2010, at which time there was diffuse  gastritis with multiple gastric erosions and hemorrhagic gastritis in the  fundus.  Esophageal erosions consistent with reflux.  The gastric antral  biopsies were all unremarkable.  Distal esophageal biopsies showed evidence  of Barrett's esophagus and chronic inflammation.  Negative for dysplasia.   Gastric biopsies for PyloriTek were negative.     Limited abdominal ultrasound October 27, 2010 was unremarkable.  CT scan  of the abdomen and pelvis without contrast on October 28, 2010, showed a  few small low-density foci within the liver that are too small  to  characterize.  The rest of the exam was unremarkable.     RISK FACTORS:  Hypertension, hyperlipidemia, diabetic neuropathy involving both of his  hands and feet, left-sided ACL repair of his knee.  Gastroesophageal reflux  disease, rhabdomyolysis, anemia.     ALLERGIES:  None.                                                                                                           Page 1 of 3  [name], [name]                                                    MRN:          [id number]                                                          Account:      [id number]                                                     Document ID:  [id number]                                               Service Date: 11/25/2010                                                                                       MEDICATIONS:  As an outpatient, Protonix, and Carafate.  The patient admits to being  noncompliant with lisinopril and insulin.     As an inpatient, patient is on sucralfate, pantoprazole, atropine,  nitroglycerin, acetaminophen, metoclopramide, lorazepam, insulin,  enalapril, lisinopril, ondansetron, promethazine, Ketorolac.     SOCIAL HISTORY:  No history of smoking and drinks alcohol socially.     FAMILY HISTORY:  Two grandmother's and father with diabetes.     PHYSICAL EXAMINATION:  VITAL SIGNS:  Blood pressure 150/103, heart rate of 114, respiratory rate  16, temperature 98.6 degrees Fahrenheit and 96% oxygen saturation on room  air.  SKIN:  No jaundice or significant rash seen.  HEENT:  No icterus or lymphadenopathy.  RESPIRATORY:  No rales, rhonchi, or wheezing.  CARDIOVASCULAR:  Normal S1, S2.  No S3, S4, gallops or rubs.  ABDOMEN:  Mild distention.  Nontender.  No hepatosplenomegaly or masses  detected.  EXTREMITIES:  No edema or erythema.     LABORATORY DATA:  I reviewed the portal system.  Glucose was 181 yesterday.     IMPRESSION AND PLAN:  1.  Epigastric pain, nausea and vomiting -- likely due to  gastroparesis  associated with the patient's diabetic ketoacidosis.  Although this has  improved, his glucose was still elevated as of yesterday.  As per my  discussion with the hospitalist earlier today, the use of narcotic  analgesics is likely contributing to the episodes of nausea and vomiting as  well as gastroparesis.  I advised to discontinue the use of all narcotic  analgesics.  It is okay to use Toradol for pain management for now.   Continue metoclopramide, Phenergan, and ondansetron.  Continue supportive  care.  Dr. Welton Flakes will be back on Monday if the patient is not improved by  then.  2.  Barrett's esophagus -- patient reports being compliant with Protonix.   I will defer to Dr. Welton Flakes for recommended endoscopic followup.  3.  Gastritis -- biopsies did not reveal evidence of Helicobacter pylori  infection.  The patient did not report the intake of aspirin and NSAIDs.  I                                                                                                           Page 2 of 3  [name], [name]                                                    MRN:          [id number]                                                          Account:      [id number]                                                     Document ID:  [id number]                                               Service Date: 11/25/2010  will defer to Dr. Welton Flakes to attempt to determine the etiology of the  gastritis.  However, I did not feel it is a contraindication to using  Toradol for pain management as outlined above.              D:  11/25/2010 17:07 PM by Donella Stade, MD [phone])  T:  11/26/2010 18:53 PM by Marylene Buerger      Everlean Cherry: [id number]) (Doc ID: [id number])        cc:                                                                                                            Page 3 of 3

## 2010-12-06 NOTE — H&P (Signed)
[name], [name]                                                    MRN:          [id number]                                                          Account:      [id number]                                                     Document ID:  [id number]                                                                                                                                   MRN: [id number]  Document ID: [id number]  Admit Date: 11/21/2010     Patient Location: [id number]   Patient Type: I     ATTENDING PHYSICIAN: [name], MD        REASON FOR ADMISSION:  Diabetic ketoacidosis.     HISTORY OF PRESENT ILLNESS:  This is a 35 year old African-American male with diabetes mellitus type 2,  gastroesophageal reflux disease, gastritis, and gastroparesis, presented  with a 2-day  history of nausea, vomiting, and epigastric pain.  Per  patient, he states he was compliant with Lantus 35 units once a day.  He  does not check his sugars each day.  He had no sick contacts.  He did not  feel unwell before this.  He states that he was eating just fine 1-day   prior to this event.  He did drink some alcohol a few days ago, two beers,  that did not upset stomach.  Now, he feels epigastric pain and nausea,  vomiting, and weakness.  He brought himself to the emergency room for  evaluation.  He was found to be in diabetic ketoacidosis and was started on  insulin drip and was given fluid.     REVIEW OF SYSTEMS:  As per HPI, all other systems are negative.     PAST MEDICAL HISTORY:  The patient has diabetes type 2 diagnosed at age 64 years of age.  He has  gastroesophageal reflux disease,  gastroparesis.  He had an EGD done  October 29, 2010, in a prior admission that indicated diffuse gastritis  with multiple gastric erosions, hemorrhagic gastritis in the fundus and Dr.  Welton Flakes did the procedure.  PAST SURGICAL HISTORY:  Left knee ACL reconstruction.     ALLERGIES:  The patient has no known drug allergies.     SOCIAL HISTORY:  Social  alcohol, no smoking, no IVDA.  The patient recently moved to this  area approximately a year ago.  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     OUTPATIENT MEDICATIONS:  Carafate 1 gram p.o. before meals and at bedtime, lactate one tablet p.o.  before each meal and bedtime, Lantus 35 units subcutaneous daily, Protonix  40 mg p.o. b.i.d.     PHYSICAL EXAMINATION:  VITAL SIGNS:  Temperature 98.3, pulse 130s, blood pressure is 140s/80,  saturating 100% on room air.  GENERAL:  Does not appear to be in acute distress.  He did have an episode  of emesis during the examination.  HEENT:  Pupils are 2 mm, equal, reactive to light and accommodation.  The  patient has dry mucosa with no thrush in oropharynx.  CARDIOVASCULAR:  Tachycardic, but normal S1, normal S2.  No murmurs, rubs,  or gallops.  PULMONARY:  Clear to auscultation bilaterally.  ABDOMEN:  Mild epigastric tenderness.  No rebound, soft, positive bowel  sounds.  EXTREMITIES:  No lower extremity edema.     DIAGNOSTIC STUDIES:   White count is 14.5, hemoglobin 17, platelets 305.  INR is 1.0.  Alcohol  level is less than 10.  CK is 120, lipase is 122.  Troponin less than  0.012.  AST 17, ALT 37.  Sodium 142, potassium 4.6, creatinine is 1.4, BUN  27, glucose was 424, anion gap calculated at 20, chloride is 102,  bicarbonate 22.  H. pylori is negative based upon a biopsy done October 29, 2010.       Prior images in the past to workup his  abdominal pain include abdominal and  pelvic CT October 28, 2010, indicates no obstruction and normal appendix.     Abdominal ultrasound September 14, indicates a normal gallbladder and  normal bile duct      EKG indicates no ST elevation, some mild nonspecific T-wave inversion in V4  to V6.     IMPRESSIONS:  1.  Diabetic ketoacidosis.   2.  Gastritis.  3.  Gastroesophageal reflux disease .   4.  Diabetes type 2.  5.  Gastroparesis.  6.  Dehydration.                                                                                                           Page 2 of 3  [name], [name]                                                    MRN:          [id number]                                                          Account:      [id number]                                                     Document ID:  [id number] 11 [id number]  7.  Acute kidney injury secondary to dehydration.     PLAN:  1.  Endocrine:  The patient is in diabetic ketoacidosis, would initiate  insulin drip, continue with fluid resuscitation.  2.  GI:  Diffuse gastritis and gastroesophageal reflux disease.  Continue  with outpatient, Protonix 40 mg p.o. b.i.d. and Carafate, Zofran as needed  for nausea.  No indication at this time of severe pancreatitis.  3.  Renal:  Acute kidney injury secondary to dehydration.  I would do  aggressive fluid resuscitation and monitor creatinine.  4.  Deep venous thrombosis prophylaxis.  5.  Infectious diseases:  The patient has leukocytosis most likely  secondary due to pancreatitis.  No indication of infection at this time.   If the patient has a fever, would start antibiotics at that time.              _______________________________     Date/Time Signed: _____________  Lawernce Ion MD ([id number])     D:  11/21/2010 20:04 PM by Dr. Lawernce Ion, MD ([id number])  T:  11/21/2010 21:44 PM by MD      Everlean Cherry:  [id number]) (Doc ID: [id number])        cc:                                                                                                            Page 3 of 3  Authenticated by Lawernce Ion, MD On 11/23/2010 01:47:46 PM

## 2010-12-06 NOTE — H&P (Signed)
[name], [name]                                                    MRN:          [id number]                                                          Account:      [id number]                                                     Document ID:  [id number] 11 [id number]                                                                                                                                   MRN: [id number]  Document ID: [id number]  Admit Date: 12/04/2010     Patient Location: [id number]   Patient Type: I     ATTENDING PHYSICIAN: NIDHI GILL, MD        CHIEF COMPLAINT:  Persistent nausea, vomiting, and epigastric pain.     HISTORY OF PRESENT ILLNESS:  This is a 35 year old male with past medical history of diabetes type 1,  diabetic gastroparesis, hypertension, history of recent erosive gastritis,  H. pylori was noted to be negative, and history of gastroesophageal reflux  disease.  The patient was recently discharged for similar complaints.  He  came in with complaints of epigastric pain that started today.  Initially,  it was intermittent, now is continuous, sharp in nature without any  radiation, associated with persistent nausea and vomiting.  The patient is  unable to keep any kind of food or liquids down.  Vomitus is mostly with  food particles.  Denies any blood in the vomitus.  Prior to this episode,  the patient had eaten steak.  He denies any recent alcohol abuse.  Per  patient, he has been compliant with his medications.  He denies any  associated fever or chills.  Urine habits have been normal.  Stools have  been normal.  No bleeding from any site.     REVIEW OF SYSTEMS:  No headache, no loss of consciousness, no seizures.  Vision is at baseline.   Admits to dizziness.  No chest pain or shortness of breath, no cough, no  PND, no orthopnea.  No leg swelling.  No palpitations or diaphoresis.  A  12-point  review of systems as discussed above,  otherwise negative.  The  patient has previously had an EGD that showed erosive  gastritis.     ALLERGIES:  Not allergic to any medication.     OUTPATIENT MEDICATIONS:  He was discharged on Carafate 1 gram 4 times a day, lactate prior to each  meal, Lantus 35 units subcutaneously nightly, Protonix 40 b.i.d., Reglan 10  mg q.8 h., Tylenol as needed, Zofran 4 mg every 6 hours as needed,  hydrochlorothiazide 25 daily, lisinopril 40 daily.     PAST SURGICAL HISTORY:                                                                                                           Page 1 of 3  [name], [name]                                                    MRN:          [id number]                                                          Account:      [id number]                                                     Document ID:  [id number] 11 [id number]                                                                                                                                   Left knee reconstruction.     SOCIAL HISTORY:  Alcohol is occasional, but patient denies any recent alcohol use.  No  smoking or drugs.     FAMILY HISTORY:  Significant for diabetes.     PHYSICAL EXAMINATION:  VITAL SIGNS:  Temperature 98.1, pulse rate 116, respiratory rate 18, blood  pressure 150/95, saturating 100% on room air.  GENERAL:  Young male lying in no respiratory distress.  HEAD AND NECK:  Pink conjunctivae, anicteric sclerae.  No JVD, no bruits.   Mucous membranes are dry.  Pupils are reactive bilaterally.  Extraocular  movements are intact.  CARDIOVASCULAR:  S1, S2 is normal.  No murmurs or gallops.  CHEST:  Clear to auscultation bilaterally, no added sounds.  ABDOMEN:  Soft, epigastric tenderness present on deep palpation.  No  rebound, no guarding.  Bowel sounds heard.  EXTREMITIES:  No edema.  Dorsalis pedis +2 bilaterally.  CENTRAL NERVOUS SYSTEM:  The patient is alert and oriented x3.  Cranial  nerves II through XII grossly intact.  There are no focal deficits.     LABORATORY DATA:  WBC 10.12, hemoglobin 16.6, hematocrit 48,  platelets 312.  Urinalysis  negative, ketones 1+.  Lipase 205.  LFTs normal.  Sodium 137, potassium  5.6, bicarbonate 93, BUN 13, creatinine 1.15.  Blood sugars 370.  Anion gap  of 15.     ASSESSMENT AND PLAN:  This is a 35 year old male with past medical history as stated above who  came in with diabetes.     1.  Uncontrolled diabetes type 1.  We will restart patient on his Lantus of  35 units tonight.  Continue IV hydration and monitor blood sugars.  I did  discuss briefly with Dr. Thornton Papas as well.  She agrees with the above  plan.  Per patient, he has been compliant with his medications.  2.  Severe persistent nausea and vomiting, likely secondary to  gastroparesis with recent erosive gastritis.  We will continue patient on  Phenergan and Zofran p.r.n. along with Reglan IV q.8 h. We will also  reconsult Dr. Welton Flakes in a.m.  3.  Epigastric pain, likely secondary to the severe gastroparesis with  uncontrolled diabetes and recent erosive gastritis.  We will continue on  Carafate, continue proton pump inhibitor q.12 h. Pain medications as                                                                                                           Page 2 of 3  [name], [name]                                                    MRN:          [id number]                                                          Account:      [id number]  Document ID:  [id number] 11 [id number]                                                                                                                                   needed.  Follow up with Dr. Welton Flakes in a.m.  4.  Hypertension, uncontrolled.  Restart outpatient medications, follow  blood pressure.  5.  Sinus tachycardia, likely secondary to volume depletion and underlying  uncontrolled diabetes.  We will continue intravenous hydration and monitor  patient.  6.  Dehydration.  Give IV fluids  7.  Recent gastritis.  We will place patient on proton pump  inhibitor and  monitor.  8.  Hyperkalemia.  Follow up with repeat laboratories.  9.  Bilateral sequential compression devices for deep venous thrombosis  prophylaxis.           Electronic Signing Provider  _______________________________     Date/Time Signed: _____________  Lana Fish MD ([id number])     D:  12/04/2010 22:51 PM by Dr. Lana Fish, MD ([id number])  T:  12/05/2010 08:07 AM by Minda Meo      (Conf: [id number]) (Doc ID: [id number])        cc: Gala Romney MD                                                                                                           Page 3 of 3

## 2010-12-06 NOTE — Discharge Summary (Signed)
[name], [name]                                                    MRN:          [id number]                                                          Account:      [id number]                                                     Document ID:  [id number]                                                                                                                                   MRN: [id number]  Document ID: [id number]  Admit Date: 10/27/2010  Discharge Date: 10/29/2010     ATTENDING PHYSICIAN:  Dorann Ou, MD        FINAL DIAGNOSES:  1.  Mild diabetic ketoacidosis, resolved.  2.  Type 1 diabetes mellitus, uncontrolled.  3.  Severe epigastric pain and intractable nausea and vomiting, resolved.  4.  Esophagogastroduodenoscopy revealing gastritis with erosions and  esophagitis, no peptic ulcer disease, no gastric outlet obstruction, normal  duodenum.  5.  Hypertensive blood pressures in hospital, without prior diagnosis of  hypertension.     DISCHARGE MEDICATIONS:  See medical reconciliation form in Portal.     DISCHARGE INSTRUCTIONS:  1.  The patient was asked to follow up with his primary care physician  within 1 week.  2.  The patient will need appropriate management of his blood pressure and  blood sugars.  3.  The patient was instructed not to use NSAIDs or aspirin.     DISCHARGE DISPOSITION:  Home.     DISCHARGE CONDITION:  Stable and improved.     HISTORY OF PRESENT ILLNESS:  This 35 year old male with a history of type 1 diabetes developed  intractable nausea and vomiting 3 days prior to admission.  Two days prior  to admission, he was in our emergency room, nausea and vomiting were  controlled, and he was sent home.  Then, the previous day prior to this  admission, he developed the same symptoms as well as intense epigastric  pain.  In the emergency department, his blood pressure was 160/96.  His  total CK was 2418.  His serum glucose was 403, and anion gap  was 14.  He  was admitted for further care.      See admission history and physical examination for further details.                                                                                                           Page 1 of 2  [name], [name]                                                    MRN:          [id number]                                                          Account:      [id number]                                                     Document ID:  [id number]                                                                                                                                      COURSE IN THE HOSPITAL:  The patient's anion gap rose to 16 shortly after admission.  He was given  intravenous hydration aggressively, as well as insulin.  His gap rapidly  normalized as blood sugars improved dramatically.  Interestingly, the  patient takes 35 units of Lantus daily.  In the hospital, the patient's  blood sugars were well controlled on 15 units daily, but his oral intake  was minimal.  The patient complained constantly of intense epigastric pain.   Dr. Gala Romney performed the above procedure.  He was continued on his  Protonix 40 mg b.i.d., and Carafate was added.  On the day of discharge, he  had tolerated a full liquid lunch at the time of this dictation.  If he  does well with a regular dinner, he will be discharged with followup by his  primary care  doctor.     At time of discharge, the patient's BMP revealed a sodium 142, potassium  4.1, chloride 109, CO2 26, glucose 165, creatinine 0.99.  Liver function  tests were normal.     The patient's systolic blood pressures were consistently in the 150s during  his stay, with diastolic pressures in the upper 80s to low 90s.  I did not  discharge the patient on antihypertensive medication, but obviously he will  require antihypertensive medication by his primary care physician, starting  with an ACE inhibitor.     Dr. Welton Flakes did not recommend follow up with him again unless needed.               D:  10/29/2010 15:53 PM by Anthony Sar, MD ([id number])  T:  10/31/2010 09:42 AM by Netta Cedars      Everlean Cherry: [id number]) (Doc ID: [id number])           cc: Burt Knack MD   Gala Romney MD                                                                                                           Page 2 of 2  Authenticated by Dorann Ou, MD On 10/31/2010 12:11:08 PM

## 2010-12-06 NOTE — Op Note (Signed)
[name], [name]                                                    MRN:          [id number]                                                          Account:      [id number]                                                     Document ID:  [id number]                                               Procedure Date: 10/29/2010                                                                                    MRN: [id number]  Document ID: [id number]  Admit Date: 10/27/2010     Patient Location: DISCHARGED 10/29/2010  Patient Type: I     SURGEON: NADEEM A KHAN MD  ASSISTANT:          TITLE OF PROCEDURE:  Esophagogastroduodenoscopy with biopsies.       PREOPERATIVE DIAGNOSIS:    See Indication.       POSTOPERATIVE DIAGNOSIS:   See Impression.       INSTRUMENT USED:  GIF-180 video Olympus endoscope.     MEDICATIONS:  Per anesthesia.     INDICATIONS:  The patient is a 35 year old gentleman who has been admitted to the  hospital with abdominal pain, nausea, and vomiting.  Upper endoscopy is  recommended for further evaluation of symptoms and to rule out gastric  outlet obstruction, peptic ulcer disease.       The risks, benefits, and alternatives of the procedure of upper endoscopy  were discussed in detail with the patient and informed consent was  obtained.  The patient was examined and was deemed suitable to undergo the  test.     DESCRIPTION OF PROCEDURE:   The patient was put in the left lateral position and medicated per  anesthesia staff.  A flexible video endoscope was passed from mouth to  distal esophagus.  Upper and midesophagus were normal.  The distal  esophagus showed evidence of esophagitis.  No evidence of esophageal  ulceration, stricture or mass lesion noted.  Multiple esophageal biopsies  were obtained and were sent for histopathology analysis.  A small hiatal  hernia was associated with reflux of gastric contents.  The scope was  Page 1 of 2  [name], [name]                                                    MRN:          [id number]                                                          Account:      [id number]                                                     Document ID:  [id number]                                               Procedure Date: 10/29/2010                                                                                    passed through the GE junction into the body of stomach.  The gastric body  revealed thickened gastric folds with edema, erythema, and mucosal  erosions.  This is consistent with diffuse gastritis.  Gastric erosions and  gastritis was hemorrhagic around the fundus and the GE junction area.   Multiple biopsies were obtained from the gastric antrum and were sent for  histopathology analysis and H pylori analysis.  No evidence of active  bleeding or ulceration noted.  No evidence of gastric outlet obstruction  noted as the scope was advanced through the lesser curvature into the  antrum and subsequently through pylorus into the first and second part of  the duodenum, which were normal in appearance.  On retroflexion, the fundus  and angularis were without any lesions.  The stomach was decompressed.  The  rest of the mucosa was examined.  The patient tolerated the procedure well  and was discharged to the recovery area without any complications in stable  condition.     IMPRESSION:  1.  Diffuse gastritis with multiple gastric erosions and hemorrhagic  gastritis in the fundus, status post biopsies as above.    2.  Esophageal erosions consistent with reflux disease.  3.  No evidence of peptic ulcer disease or gastric outlet obstruction  noted.  4.  Normal duodenum.     PLAN:  1.  Continue with bowel rest.  2.  Intravenous proton pump inhibitors.  3.  Add Carafate to current regimen.  4.  Follow up biopsy results for ruling out Helicobacter pylori.  5.  Glycemic control and watching for  hyperosmolar syndrome or  diabetic  ketoacidosis.  6.  Monitoring for rhabdomyolysis.  7.  Advance the diet to low-residue diabetic diet as tolerable.  8.  GI followup on an outpatient basis.              D:  10/29/2010 10:05 AM by NADEEM A. Welton Flakes, MD [phone])  T:  10/30/2010 12:30 PM by Gwenyth Bender      Everlean Cherry: [id number]) (Doc ID: [id number])        cc: Burt Knack MD                                                                                                           Page 2 of 2  Authenticated by NADEEM A. Welton Flakes, M.D. On 11/02/2010 09:31:05 AM

## 2010-12-06 NOTE — Discharge Summary (Signed)
[name], [name]                                                    MRN:          [id number]                                                          Account:      [id number]                                                     Document ID:  [id number]                                                                                                                                   MRN: [id number]  Document ID: [id number]  Admit Date: 11/21/2010  Discharge Date: 11/30/2010     ATTENDING PHYSICIAN:  Harrell Gave, MD        DISCHARGE CONDITION:  Stable.  Discharged to home.     DISCHARGE ACTIVITY:  As tolerated.     DISCHARGE DIET:    Low-residue, 10-gram fiber diabetic diet.     DISCHARGE ACTIVITY:  As tolerated.     DISCHARGE FOLLOWUP:  1.  Dr. Gala Romney in 1 to 2 weeks.  2.  Primary care physician in 1 week.     DISCHARGE MEDICATIONS:  Please see medication-reconciliation list done along with this discharge  summary.     DISCHARGE DIAGNOSES:  1.  Diabetic ketoacidosis with underlying diabetes type 1, now resolved.   Blood sugars fairly well controlled.  2.  Persistent nausea and vomiting, now resolved, secondary to underlying  diabetic gastroparesis, now much improved.  3.  Hypokalemia, replaced.  4.  Hypomagnesemia, will be replaced.  5.  Hypertension, uncontrolled.  Medications adjusted for better blood  pressure control.  6.  Abdominal pain and epigastric pain secondary to underlying severe  gastroparesis and history of gastritis.  Continue current management.  Pain  much improved.  The patient is stable and cleared by gastroenterology for  discharge.  7.  History of gastritis and negative Helicobacter pylori.  Continue proton  pump inhibitor and sucralfate.  Follow up with Dr. Welton Flakes as an outpatient.     HOSPITAL COURSE:  Page 1 of 3  [name], [name]                                                    MRN:           [id number]                                                          Account:      [id number]                                                     Document ID:  [id number]                                                                                                                                   Please refer to the detailed history and physical done at the time of  admission.  The patient came in with diabetic ketoacidosis.  He was  initially admitted to the intensive care unit for diabetic ketoacidosis  with underlying medical problems as discussed above.  He was kept on an  insulin drip and fluid-resuscitated.  Slowly, the patient's blood sugars  improved.  He was transferred out of the unit to the floor on Lantus with  adjusting doses done.  The patient's blood sugars are now fairly well  controlled at 107 this morning.  However, the patient also had an  increasing amount of epigastric tenderness and pain along with persistent  nausea and vomiting, not tolerating diet, initially requiring several days  of clear liquids with underlying pain, nausea, and vomiting, which is  secondary to the patient's severe diabetic gastroparesis.  The patient  recently had an EGD which was consistent with gastritis.  An H. pylori was  previously also noted to be negative.  Lipase was noted to be normal during  hospitalization.  Abdominal x-ray showed no evidence of obstruction.  The  patient's pain was consistent in the left epigastric region along with  nausea and vomiting, persistent secondary to his severe gastroparesis.  The  patient's condition slowly started to improve.  Dr. Welton Flakes and Dr. Jens Som  were consulted, who evaluated the patient.  Repeat amylase and lipase were  within normal.  The patient's condition slowly improved.  He is now  tolerating low fiber, lowfat diet; no nausea or vomiting noted.  Epigastric  pain is much improved and only minimal at this time.  He has remained  afebrile.  Electrolytes  including hypokalemia, hypomagnesemia have been  replaced prior to discharge today.  He is improved.  Blood sugars are  controlled.  Hypertension was uncontrolled.  Medications have been adjusted  for better blood pressure control.  Today blood pressure was 136/90 prior  to discharge.  Previous extensive workup for similar abdominal pain,  nausea, and vomiting was done, which also included an ultrasound which  showed no gallstones.  EGD in the past with gastritis, no H. pylori.  The  patient is to continue PPI, Carafate, and Reglan as an outpatient.   Condition is improved; discussed with Dr. Welton Flakes.     DISPOSITION:  The patient has been cleared for discharge today, to continue followup as  discussed above.     I spent 45 minutes discussing with patient and consultants and arranging  for discharge today.           Electronic Signing Provider  _______________________________     Date/Time Signed: _____________  Lana Fish MD ([id number])                                                                                                              Page 2 of 3  [name], [name]                                                    MRN:          [id number]                                                          Account:      [id number]                                                     Document ID:  [id number]  D:  11/30/2010 14:48 PM by Dr. Lana Fish, MD ([id number])  T:  12/01/2010 14:20 PM by Georges Lynch      Everlean Cherry: [id number]) (Doc ID: [id number])           cc: Thornton Papas MD   Burt Knack MD   Gala Romney MD                                                                                                           Page 3 of 3  Authenticated by Lana Fish, MD On 12/01/2010 05:38:40 PM

## 2010-12-06 NOTE — H&P (Signed)
[name], [name]                                                    MRN:          [id number]                                                          Account:      [id number]                                                     Document ID:  [id number]                                                                                                                                   MRN: [id number]  Document ID: [id number]  Admit Date: 10/27/2010     Patient Location: [id number]   Patient Type: O     ATTENDING PHYSICIAN: Dorann Ou, MD        PRIMARY MEDICAL DOCTOR:   Dr. Burt Knack.     CHIEF COMPLAINT:  Intractable nausea, vomiting, and hyperglycemia.       HISTORY OF PRESENT ILLNESS:   The patient is a 35 year old male with past history significant for  diabetes mellitus type 1, who was at his usual state of his health up until  3 days ago when he started having this intractable nausea and vomiting, to  the point where he was not able to keep anything in.  He was in the  emergency room for the same reason on October 25, 2010 and was treated in  the emergency room.  His nausea and vomiting were controlled and he was  sent home.  He states that he was doing okay and then on the evening of the  13th he ate some [employer] and stated that right after that he started feeling  intractable nausea, vomiting, and it was associated with some abdominal  discomfort which prompted him to come over to the emergency room.  The  patient also states that given this intractable nausea and vomiting, he has  not been taking his insulin dosage and also has not been compliant with his  diet particularly, for the same reason.  He denies having any fever or  fatigue.  Denies having any constipation or diarrhea.  Does complain of  some epigastric and left upper quadrant discomfort but no significant pain.  He says it is like a dull aching pain.  Denies having any chest pain,  palpitations, dizziness, or diaphoresis.     REVIEW OF  SYSTEMS:  Upon reviewing system by system, his 10-point review of systems is  unremarkable apart from the negative and pertinent findings that were  explained above.  He states that he was able to tolerate one meal on  September 13 and after that he started having the above-mentioned symptoms.   He denies having any flank pain.  He denies having any headache or any  neurologic symptoms.     MEDICATIONS:  He is on Lantus 35 units subcutaneously daily.                                                                                                           Page 1 of 3  [name], [name]                                                    MRN:          [id number]                                                          Account:      [id number]                                                     Document ID:  [id number]                                                                                                                                      ALLERGIES:  No drug allergies.     PAST MEDICAL HISTORY:  Diabetes mellitus type 1.     PAST SURGICAL HISTORY:  Left knee ACL reconstruction.     SOCIAL HISTORY:  Not a smoker.  Social alcohol user.  Denies any illicit drug usage.  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     FAMILY HISTORY:  Diabetes.  Denies heart disease or cancer in the family.     PHYSICAL EXAMINATION:  VITAL SIGNS:  At the time of triage is significant for a blood pressure  160/96, pulse of 83, respirations are 27, temperature of 96.1, pulse  oximetry 100% on room air.  At the time of my admission in the emergency  room, his blood pressure was 150/86, pulse of 97, respirations of 18, pulse  oximetry 100% on room air.  GENERAL:  Patient is awake and alert, oriented to place, person, and time,  does seem to be in acute distress secondary to the  intractable nausea and  vomiting and hiccups.  Through my entire history and physical, the patient  has been thrashing and turning around in the bed secondary to significant  discomfort from the symptoms.  HEENT:  Pupils are equal, reacting to light.  Extraocular movements are  intact.  NECK:  Supple, no JVD, throat is clear.    CHEST:  Bilaterally does have any lymph nodes.    CHEST:  Bilaterally moving the chest symmetrically.  LUNGS:  Bilateral air entry present, clear to auscultation, no added sounds  appreciated.  HEART:  S1, S2 present, regular rate and rhythm, no murmurs appreciated.  ABDOMEN:  Soft, bowel sounds present, nontender.  No guarding or rigidity.  EXTREMITIES:  No edema, cyanosis, or clubbing.  Pulses are equal  bilaterally.  SKIN:  Intact, no rashes appreciated.  NEUROLOGIC:  Face is symmetrical, tongue is midline, seems to be moving all  of the extremities equally.                                                                                                              Page 2 of 3  [name], [name]                                                    MRN:          [id number]                                                          Account:      [id number]                                                     Document ID:  [id number]                                                                                                                                   DIAGNOSTIC STUDIES:   Significant for a WBC count of 10,990, H and H of 15/43, platelets of 254;  that was on October 25, 2010.  On October 27, 2010, his WBC count of  9.580, H and H of 15.3/43.2, platelets of 265.  Lipase 272.  CK of 2418.   Troponin less than 0.012, myoglobin 330.5, high alkaline phosphatase 154,  ALT of 52.  Blood 1+, lipase 272.  Sodium 137, potassium 4.3, chloride of  98, bicarbonate of 25, BUN of 15, creatinine of 1.1, serum glucose 403.   Anion gap of 14.     ASSESSMENT AND PLAN:  1.  Nausea and vomiting with  some abdominal discomfort.  Given the fact  that his pain is exacerbated with eating [employer], I would get a right upper  quadrant to rule out cholecystitis.  We will place the patient on  antinausea medications, IV pain medications, IV hydration.  We will call in  a gastroenterology consult.  2.  Uncontrolled blood sugar and hyperglycemia.  We will just start the  patient on sliding scale insulin with Accu-Cheks and follow up the chem-8  every 8 hours.  3.  Rhabdomyolysis with extremely elevated CPK 2410 with a normal MB  fraction.  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  We will place the patient on intravenous hydration and  observe the patient.  Repeat CPK every day  4.  Elevated blood pressure.  Observe the patient.  We will start the  patient on lisinopril  5 mg every day.       The rest of the management will depend on how he does in the next 24 to 48  hours.              D:  10/27/2010 06:36 AM by Teena Irani, MD ([id number])  T:  10/27/2010 10:44 AM by MD      Everlean Cherry: [id number]) (Doc ID: [id number])        cc: Burt Knack MD                                                                                                           Page 3 of 3  Authenticated by Brynda Rim, MD On 12/03/2010 08:58:56  PM

## 2010-12-06 NOTE — Consults (Signed)
[name], [name]                                                    MRN:          [id number]                                                          Account:      [id number]                                                     Document ID:  [id number]                                               Service Date: 10/29/2010                                                                                    MRN: [id number]  Document ID: [id number]  Admit Date: 10/27/2010     Patient Location: DISCHARGED 10/29/2010  Patient Type: I     CONSULTING PHYSICIAN: NADEEM A KHAN MD     REFERRING PHYSICIAN: Anthony Sar MD        REASON FOR CONSULTATION:  This patient is seen at the request of the hospitalist service for  evaluation of abdominal pain and recurrent nausea and vomiting.     HISTORY OF PRESENT ILLNESS:  The patient is a 35 year old gentleman who has known history of  gastroesophageal reflux and prior history of gastritis requiring upper  endoscopy many years ago.  He was doing fairly well until 2 days prior to  hospitalization when he got sick after he ate a tuna sandwich.  He said  that he has been working 2 jobs which were very physical and strenuous.  He  started to have recurrent episodes of heartburn and indigestion, followed  by nausea and then subsequent vomiting.  He had multiple episodes of emesis  with starting to have upper abdominal discomfort.  The pain was sharp in  character and mostly diffusely involving the epigastric and upper  quadrants.  He denies any diarrhea or melena.  He denies any hematemesis.   He had no fever or chills.  He denies any excessive use of nonsteroidal  antiinflammatory medication except for occasional use of Motrin.  He denies  any urinary complaints.  With persistent symptoms of nausea and intractable  vomiting, he was brought to the emergency room where he is admitted for  further management.  He continues to have symptoms of dry heaves and  vomiting.  He complains of  upper abdominal pain.  His workup in the  emergency room revealed no acute problems.     PAST MEDICAL HISTORY:  Hypertension, hyperlipidemia, diabetes with diabetic neuropathy, prior  history of reflux disease and endoscopy and recent history of  rhabdomyolysis with increased CPK levels.  The patient also has  microcytosis without prior history of sickle cell disease.  Mild anemia.     PAST SURGICAL HISTORY:  He has had knee surgeries in the past.     ALLERGIES:                                                                                                           Page 1 of 3  [name], [name]                                                    MRN:          [id number]                                                          Account:      [id number]                                                     Document ID:  [id number]                                               Service Date: 10/29/2010                                                                                    No known drug allergies.     MEDICATIONS PRIOR TO HOSPITALIZATION:  Lipitor, lisinopril, Neurontin, and his diabetic medicines including Lantus  and Humalog on an as-needed basis, which he has not been taking.     SOCIAL HISTORY:  He is a smoker.  He denies any history of ethanol abuse and denies any  history of smoking.  He denies any drug use or substance abuse.     FAMILY HISTORY:  Unremarkable for colon  cancer or GI malignancies.     REVIEW OF SYSTEMS:  Essentially as above.  He denies any chest pains.  He denies any cough or  sputum production.  He denies any hemoptysis, hematuria or hematochezia.   His bowel movements are regular.     PHYSICAL EXAMINATION:  GENERAL:  He is a well-developed, rather anxious looking gentleman who is  not in acute distress.  VITAL SIGNS:  Show pulse of 71, blood pressure 136/64, breathing rate of  18.  He has a temperature of 96.8 degrees F.  HEENT:  Atraumatic, normocephalic.  PERRLA.  EOMI.  Sclerae  anicteric.   Conjunctivae normal.  Mucous membranes are moist.  NECK:  Supple.  There is no JVD, no thyromegaly, no nodes or bruits.  CHEST:  Normal vesicular breath sounds.  CARDIOVASCULAR:  Normal first and second heart sounds.  Regular rate and  rhythm.  ABDOMEN:  Soft, nondistended, diffuse tenderness with slight voluntary  guarding, especially in the upper abdomen and epigastric region without any  rebound or guarding.  Bowel sounds were positive.  No masses or free fluid  was noted.  EXTREMITIES:  No edema, cyanosis or clubbing.  CENTRAL NERVOUS SYSTEM:  He is awake, alert, oriented without focal  neurological deficits.     DIAGNOSTIC STUDIES:   Reveals a WBC count of 9, hemoglobin 15, hematocrit 42, and platelet count  265.  His comprehensive metabolic profile showed AST level of 52 and ALT of  54 with alkaline phosphatase of 154.  Bilirubin was 0.8 with total protein  of 7.8.  His glucose was 403 with a chloride of 98 and bicarbonate of 25.   Urine shows some ketones present.  Leukocyte esterase was negative.   Myoglobin level was 330 and his CPK was 2418.  Ultrasound of the abdomen  revealed normal gallbladder without bile duct or ductal abnormality.  The                                                                                                           Page 2 of 3  [name], [name]                                                    MRN:          [id number]                                                          Account:      [id number]  Document ID:  [id number]                                               Service Date: 10/29/2010                                                                                    liver was normal in texture.  No focal mass was noted.  No biliary ductal  dilatation was seen.  CT scan of the abdomen and pelvis was also done in  the emergency room with contrast, which was negative for bowel obstruction.   The  visualized portion of the spleen, pancreas, gallbladder, and adrenal  glands as well as kidneys were normal.     IMPRESSION:  This is a 35 year old gentleman with sudden onset of nausea, vomiting, and  abdominal pain, who is noted to have slightly elevated liver enzymes with  normal amylase and lipase and normal ultrasound revealing no gallstone  disease.  The patient has prior history of gastroesophageal reflux.  The  etiology of his symptoms remains unclear.  Rhabdomyolysis leading to nausea  and vomiting and subsequent hyperglycemia and ketosis could be the most  likely cause, although gastroparesis related to diabetes and peptic ulcer  disease, gastric outlet obstruction cannot be excluded.  Gallbladder  disease would be less likely and his liver functions needs to be monitored.     RECOMMENDATIONS:  I had a long discussion with the patient and his caring physicians.  We  will agree with continuing of the supportive care.  Bowel rest.  IV  hydration.  IV proton pump inhibitors.  Antiemetics.  Avoid narcotic  analgesics as they could lead to aggravation of gastroparesis.  He will be  started on clear liquid diet.  If has persistent symptoms of abdominal  pain, further evaluation with endoscopy might be warranted.  I have  discussed the risks, benefits, and alternatives of the procedure of upper  endoscopy in detail with the patient and he was agreeable for that.   Further recommendation would depend as the case would evolve.  I would also  recommend to avoid Toradol and other nonsteroidal anti-inflammatory  medications in this situation.     Thank you for the consultation.              D:  10/29/2010 10:02 AM by NADEEM A. Welton Flakes, MD [id number])  T:  10/30/2010 10:34 AM by Minda Meo      (Everlean Cherry: [id number]) (Doc ID: [id number])        cc: Burt Knack MD  Page 3 of 3  Authenticated by NADEEM A. Welton Flakes, M.D. On 11/02/2010 09:10:50 AM

## 2010-12-08 LAB — COMPREHENSIVE METABOLIC PANEL
ALT: 44 U/L (ref 7–56)
AST (SGOT): 28 U/L (ref 5–40)
Albumin, Synovial: 2.4 g/dL — ABNORMAL LOW (ref 3.9–5.0)
Alkaline Phosphatase: 58 U/L (ref 38–126)
BUN / Creatinine Ratio: 9 (ref 8–20)
BUN: 8 mg/dL (ref 6–20)
Bilirubin, Total: 0.6 mg/dL (ref 0.2–1.3)
CO2: 26 mmol/L (ref 21.0–31.0)
Calcium: 8.4 mg/dL (ref 8.4–10.2)
Chloride: 108 mmol/L (ref 101–111)
Creatinine: 0.96 mg/dL (ref 0.66–1.25)
EGFR: >60 mL/min/{1.73_m2}
EGFR: >60 mL/min/{1.73_m2}
Glucose: 94 mg/dL (ref 70–100)
Potassium: 3.9 mmol/L (ref 3.6–5.0)
Protein, Total: 5 g/dL — ABNORMAL LOW (ref 6.3–8.2)
Sodium: 139 mmol/L (ref 135–145)

## 2010-12-08 LAB — CBC
Hematocrit: 38.5 % — ABNORMAL LOW (ref 39.0–49.0)
Hemoglobin: 12.8 g/dL — ABNORMAL LOW (ref 13.2–17.3)
MCH: 26.6 pg — ABNORMAL LOW (ref 27.0–34.0)
MCHC: 33.2 g/dL (ref 32.0–36.0)
MCV: 79.9 fL — ABNORMAL LOW (ref 80–100)
MPV: 9.6 fL (ref 9.0–13.0)
Nucleated RBC %: 0 /100WBC (ref 0.0–0.0)
Nucleted RBC #: 0 10*3/uL (ref 0.00–0.00)
Platelets: 284 10*3/uL (ref 150–400)
RBC: 4.82 M/uL (ref 3.80–5.40)
RDW: 12.5 % (ref 11.0–14.0)
WBC: 4.74 10*3/uL — ABNORMAL LOW (ref 4.80–10.80)

## 2010-12-13 NOTE — Discharge Summary (Signed)
[name], [name]                                                    MRN:          [id number]                                                          Account:      [id number]                                                     Document ID:  [id number]                                                                                                                                   MRN: [id number]  Document ID: [id number]  Admit Date: 12/04/2010  Discharge Date: 12/08/2010     ATTENDING PHYSICIAN:  Lana Fish, MD        HISTORY OF PRESENT ILLNESS:  This is a 35 year old African-American male with a past medical history of  diabetes mellitus and diabetic gastroparesis, hypertension, and history of  erosive gastritis.  _____ he was noted to be negative and a history of  gastroesophageal reflux disease.  He was recently discharged with similar  complaint.  He came in with complaints of epigastric pain that started on  the day of admission.  Initially it was intermittent.  Afterwards, it  became continuous, significant, associated with nausea and vomiting.  The  patient was unable to keep any kind of food or liquids down, vomiting  mostly with food particles.     HOSPITAL COURSE:  The patient was admitted at that time.  White cell count was 10.   Hemoglobin was 16.  Hematocrit was 42.  UA was negative.  Ketones are 1+.       Other investigation:  HIDA scan showed normal hepatobiliary imaging study,  abnormal contraction of the gallbladder, estimated ejection fraction of  less than 1%, suggesting biliary dyskinesia.     Dr. Jed Limerick did the surgery. A laparoscopic cholecystectomy was done.  The  patient did well after the surgery.  Today it is 26.  The patient is able  to tolerate p.o.  No nausea, no vomiting, very minimal abdominal pain and  the patient was discharged to home.     DISCHARGE DIAGNOSES:  1.  Status post laparoscopic cholecystectomy.  2.  History of abnormal  HIDA scan with dyskinesia of the gallbladder.  3.  Diabetes  mellitus.  4.  Gastritis.  5.  Esophageal erosion.  6.  Gastroesophageal reflux disease.  7.  Hypertension.  8.  Past history of left knee surgery.     DISCHARGE MEDICATIONS:  1.  Protonix 40 mg p.o. daily.  2.  Percocet 1 tablet p.o. q.4 h. p.r.n.                                                                                                           Page 1 of 2  [name], [name]                                                    MRN:          [id number]                                                          Account:      [id number]                                                     Document ID:  [id number]                                                                                                                                   3.  Carafate 1 gm p.o. q.6 h.  4.  Lisinopril 40 mg p.o. daily.  5.  Zofran 4 mg p.o. q.6 h. p.r.n.  6.  Lantus 35 units subcutaneously nightly once he starts eating food  regularly.  This was discussed with the patient to check blood sugars more  frequently because the food intake.  He is not as good as before.  Decrease  the Lantus for now to 20 units for now until he starts taking regular food  like before.  Check the blood sugar at 7:00 a.m. before breakfast and  before lunch and then before dinner and also  if possible, early in the  morning.  The patient's family was at the bedside.       DISCHARGE FOLLOWUP:   1.  Follow up with the primary care doctor next week.  2.  Follow up with the surgeon next week.  3.  Follow up with the gastroenterologist next week.  4.  Follow up with the emergency room if there is an acute problem or  altered mental status, especially more so since the patient's diabetic  blood sugar frequent control is necessary.    5.  If there is any problem, the patient is instructed to call the primary  care doctor as soon as possible; especially in the initial status, he has  to decrease the Lantus from 35 units, which he was taking earlier at 20  units for  now.              D:  12/08/2010 17:06 PM by [email or web address] K. Valentina Shaggy, MD [phone])  T:  12/11/2010 10:02 AM by Georges Lynch      Everlean Cherry: [id number]) (Doc ID: [id number])           cc:                                                                                                            Page 2 of 2

## 2010-12-13 NOTE — Op Note (Signed)
[name], [name]                                                    MRN:          [id number]                                                          Account:      [id number]                                                     Document ID:  [id number]                                               Procedure Date: 12/07/2010                                                                                    MRN: [id number]  Document ID: [id number]  Admit Date: 12/04/2010     Patient Location: DISCHARGED 12/08/2010  Patient Type: I     SURGEON: C B CROSS MD  ASSISTANT:          PREOPERATIVE DIAGNOSES:  Acalculous cholecystitis versus biliary dyskinesia with epigastric pain,  nausea, vomiting, dehydration.     POSTOPERATIVE DIAGNOSES:  Acalculous cholecystitis versus biliary dyskinesia with epigastric pain,  nausea, vomiting, dehydration.     TITLE OF PROCEDURE:  Laparoscopic cholecystectomy.     ANESTHESIA:  General.     HISTORY:  This is a 35 year old male with diagnosis as above, now to undergo  procedure as outlined.  The patient is aware of the risks of bleeding,  infection, complications, anesthesia, numbness and pain change around  incisions, scar formation, need for reoperative surgery, bile duct  injuries, bile leaks, possible alteration of bowel habits with increased  frequency or looseness of stools, which can be fleeting or lifelong,  possible need for ERCP in the postoperative period, possible persistence of  symptoms or only modification thereof.  The patient is in agreement to  proceed.     DESCRIPTION OF PROCEDURE:  With the patient in the supine position, the abdomen was prepped and draped  in a sterile manner.  An infraumbilical incision was performed.  The  umbilicus was elevated with a towel clip and a Veress needle inserted.   Position was confirmed with saline drop test.  The abdomen was then  insufflated to 15 mm of pressure with CO2 gas and a 5-mm trocar applied.   Visualization of the peritoneal  cavity showed no evidence of  any trocar or  Veress needle injury.  The pelvis was obscured by viscera.  No evidence of  inguinal hernias.  The upper abdomen showed a normal-appearing liver and                                                                                                           Page 1 of 2  [name], [name]                                                    MRN:          [id number]                                                          Account:      [id number]                                                     Document ID:  [id number]                                               Procedure Date: 12/07/2010                                                                                    stomach.  The gallbladder was slightly thickened with some adhesions,  consistent with some chronic inflammation.  With a subxiphoid 10-mm trocar  applied and two 5-mm trocars along the right costal margin, the patient in  reverse Trendelenburg and left side down, the gallbladder was directed  towards the right shoulder, rolling up the right lobe of the liver.  The  gallbladder was then dissected, taking down adhesions with stripping  technique and the cautery hook dissector, coming down on either side of the  gallbladder to isolate the cystic duct/gallbladder junction and cystic  artery.  When these were well identified, they were ligated proximally and  distally with Hemoclips and transected with EndoShears.  The gallbladder  was then dissected free of the liver bed with the cautery hook dissector,  placed in an EndoCatch pouch, and  extracted in its entirety through the  subxiphoid trocar site.  Irrigation was applied and removed and hemostasis  was confirmed at all sites.  The trocar sites were then anesthetized with  0.5% Marcaine and epinephrine.  The subxiphoid fascia was closed with the  Exit closure device and 0 Vicryl suture under direct vision.  With gas and  trocars then evacuated from the abdomen,  all skin sites were closed with  subcuticular suture of 4-0 Vicryl, followed by benzoin, Steri-Strips, and  Band-Aids.  The patient tolerated the procedure well and went to the  recovery area in satisfactory condition.  The sponge and needle count was  correct.              D:  12/11/2010 13:50 PM by Mellody Dance, MD ([id number])  T:  12/12/2010 07:45 AM by       Everlean Cherry: [id number]) (Doc ID: [id number])        cc: Burt Knack MD   Telford Nab MD                                                                                                           Page 2 of 2  Authenticated by Leda Min, M.D. On 12/13/2010 12:56:29 PM

## 2010-12-13 NOTE — Consults (Signed)
RONELLE, SMALLMAN                                                    MRN:          [id number]                                                          Account:      [id number]                                                     Document ID:  [id number]                                               Service Date: 12/06/2010                                                                                    MRN: [id number]  Document ID: [id number]  Admit Date: 12/04/2010     Patient Location: DISCHARGED 12/08/2010  Patient Type: I     CONSULTING PHYSICIAN: C B CROSS MD     REFERRING PHYSICIAN:         HISTORY OF PRESENT ILLNESS:  The patient is a 35 year old who I was asked by Dr. Lana Fish from the  hospitalist service to see for concern of probable gallbladder disease.   The patient initially became ill about a month and a half ago in September  with some stomach pain associated with vomiting late at night and went to  the ER for assessment.  The next day, went home and had some [employer] and again  showed some bad indigestion, same kind of thing, and was  readmitted to the  hospital for several days; felt to have some gastroparesis, likely due to  underlying diabetes issues.  He had an ultrasound that was normal with no  evidence of any gallbladder disease, has been readmitted now on October 22,  having been treated as an outpatient for erosive gastritis and diabetic  gastroparesis, having gone home on some Carafate, now returns with sharp  abdominal pain associated with nausea and vomiting, unable to keep any  fluids down.     PAST MEDICAL HISTORY:  Type 1 diabetes, hypertension, erosive gastritis, reflux disease,  gastroparesis.     PAST SURGICAL HISTORY:  Left knee.     ALLERGIES:  No allergies to medications.     MEDICATIONS:  Lantus, Carafate, Protonix, Reglan, Zofran, Tylenol, hydrochlorothiazide,  and lisinopril.     FAMILY HISTORY:  There is a  family history of diabetes.  He is not aware of any gallbladder  disease.                                                                                                               Page 1 of 3  ZAKARIYAH, FREIMARK                                                    MRN:          [id number]                                                          Account:      [id number]                                                     Document ID:  [id number]                                               Service Date: 12/06/2010                                                                                    REVIEW OF SYSTEMS:  He has had no sore throat, runny nose, chest cold, congestion, migraines,  strokes, seizures, COPD, emphysema, asthma, heart failure.  No dysuria,  flank pain, jaundice, no blood in stool or vomiting blood.  No dark urine  or light stools.     PHYSICAL EXAMINATION:  VITAL SIGNS:  BP 154/95, pulse 116, respirations 18, temperature 98.1.  GENERAL:  Alert and oriented, no distress.  SKIN:  Warm and dry.  HEENT:  Sclerae nonicteric.  Pupils round, reactive.  Tongue midline.  NECK:  No cervical, supraclavicular adenopathy, thyroid masses, or JVD.  LUNGS:  Breath sounds equal bilaterally.  No rales, rhonchi, or wheezing.  HEART:  Sounds regular.  No rub, gallop or increased thrill.  ABDOMEN:  Soft, slight gassy distention, no guarding or rebound.   Nonpalpable aorta.  No abdominal bruits.  No hernias in supine position.  EXTREMITIES:  Lower extremities without edema.  Good capillary refill of  digits.  No clubbing of fingernails.  Good radial pulses.  No skin rash or  ulcerations.  NEUROLOGIC:  Intact.     DIAGNOSTIC STUDIES:  HIDA scan performed today shows the ejection fraction of less than 1% and  CCK caused some epigastric pain and pain on the right side.  BUN 10, sodium  139, potassium 3.8, chloride 105, CO2 26, glucose 71, creatinine 1.03,  bilirubin 0.6, alkaline phosphatase 60, SGOT 13, SGPT 34.  BUN 12, sodium  140, potassium 3.9, chloride 102, CO2 27, glucose 202, creatinine  1.0.   These were yesterday again with normal liver functions.  White count on  October 23 was 10.4.  Lipase on the 22nd was 205.  Urine on the 22nd,  yellow, cloudy, specific gravity 1.031, pH 7.5 and negative for blood.  A  CT scan of the head was performed to assess persistent nausea disorder, no  intracranial process and this was essentially normal on October 17.   Amylase on the 17th was normal at 32 and lipase of 187, and again of note,  previous ultrasound was benign with no evidence of any stone disease or  dilated ducts.     IMPRESSION:  The patient with likely acalculous cholecystitis versus biliary dyskinesia  with epigastric pain, persistent nausea, vomiting, some dehydration.   Discussed options of low-fat diet, which we would like him to do anyway,  but it is hard to maintain versus laparoscopic gallbladder removal.  Risks  of surgery include bleeding, infection, anesthesia, numbness and pain  change around the incisions, scar formation, need for reoperative surgery,  persistence of symptoms or only modification thereof, possible need for  ERCP in the postoperative period, possible bile duct injuries, bile leaks,                                                                                                           Page 2 of 3  DEQUAVION, FOLLETTE                                                    MRN:          [id number]                                                          Account:      [id number]                                                     Document ID:  [id number]                                               Service Date: 12/06/2010                                                                                    possible alteration of bowel habits with increased frequency or looseness  of stools which can be fleeting or lifelong.  Also reviewed some of these  findings with the patient's father over the phone who would like to  tentatively be here if possible.  The son feels he would  like to schedule  sooner if able.  He would like to consider the surgery tomorrow if it can  be added on.  All questions were answered and again the patient desires to  proceed with gallbladder removal.              D:  12/11/2010 13:47 PM by Mellody Dance, MD ([id number])  T:  12/12/2010 12:28 PM by Jeneen Rinks      Everlean Cherry: [id number]) (Doc ID: [id number])        cc: Burt Knack MD                                                                                                           Page 3 of 3  Authenticated by Leda Min, M.D. On 12/13/2010 12:56:37 PM

## 2010-12-21 ENCOUNTER — Inpatient Hospital Stay
Admission: EM | Admit: 2010-12-21 | Disposition: A | Discharge status: Home / Self Care | Payer: Self-pay | Source: Emergency Department | Attending: Medical Oncology | Admitting: Medical Oncology

## 2010-12-22 LAB — CBC AND DIFFERENTIAL
BASOPHILS %: 0.1 % (ref 0.0–2.0)
BASOPHILS %: 0.1 % (ref 0.0–2.0)
Baso(Absolute): 0.01 10*3/uL (ref 0.00–0.20)
Baso(Absolute): 0.01 10*3/uL (ref 0.00–0.20)
Eosinophils %: 0 % (ref 0.0–6.0)
Eosinophils %: 0 % (ref 0.0–6.0)
Eosinophils Absolute: 0 10*3/uL — ABNORMAL LOW (ref 0.10–0.30)
Eosinophils Absolute: 0 10*3/uL — ABNORMAL LOW (ref 0.10–0.30)
Hematocrit: 42.3 % (ref 39.0–49.0)
Hematocrit: 48.7 % (ref 39.0–49.0)
Hemoglobin: 14.1 g/dL (ref 13.2–17.3)
Hemoglobin: 16.5 g/dL (ref 13.2–17.3)
Immature Granulocytes #: 0.03 10*3/uL (ref 0.00–0.05)
Immature Granulocytes #: 0.06 10*3/uL — ABNORMAL HIGH (ref 0.00–0.05)
Immature Granulocytes %: 0.3 % — ABNORMAL HIGH (ref 0.0–0.0)
Immature Granulocytes %: 0.6 % — ABNORMAL HIGH (ref 0.0–0.0)
Lymphocytes Absolute: 1.49 10*3/uL (ref 1.00–4.80)
Lymphocytes Absolute: 1.58 10*3/uL (ref 1.00–4.80)
Lymphocytes Relative: 14.1 % — ABNORMAL LOW (ref 25.0–55.0)
Lymphocytes Relative: 15.7 % — ABNORMAL LOW (ref 25.0–55.0)
MCH: 26.7 pg — ABNORMAL LOW (ref 27.0–34.0)
MCH: 27.1 pg (ref 27.0–34.0)
MCHC: 33.3 g/dL (ref 32.0–36.0)
MCHC: 33.9 g/dL (ref 32.0–36.0)
MCV: 80 fL (ref 80–100)
MCV: 80.1 fL (ref 80–100)
MPV: 10.1 fL (ref 9.0–13.0)
MPV: 9.7 fL (ref 9.0–13.0)
Monocytes Absolute: 0.32 10*3/uL (ref 0.10–1.20)
Monocytes Absolute: 0.44 10*3/uL (ref 0.10–1.20)
Monocytes Relative %: 3 % (ref 1.0–8.0)
Monocytes Relative %: 4.4 % (ref 1.0–8.0)
Neutrophils Absolute: 8.02 10*3/uL — ABNORMAL HIGH (ref 1.80–7.70)
Neutrophils Absolute: 8.75 10*3/uL — ABNORMAL HIGH (ref 1.80–7.70)
Neutrophils Relative %: 79.8 % — ABNORMAL HIGH (ref 49.0–69.0)
Neutrophils Relative %: 82.8 % — ABNORMAL HIGH (ref 49.0–69.0)
Nucleated RBC %: 0 /100WBC (ref 0.0–0.0)
Nucleated RBC %: 0 /100WBC (ref 0.0–0.0)
Nucleted RBC #: 0 10*3/uL (ref 0.00–0.00)
Nucleted RBC #: 0 10*3/uL (ref 0.00–0.00)
Platelets: 283 10*3/uL (ref 150–400)
Platelets: 306 10*3/uL (ref 150–400)
RBC: 5.29 M/uL (ref 3.80–5.40)
RBC: 6.08 M/uL — ABNORMAL HIGH (ref 3.80–5.40)
RDW: 12.5 % (ref 11.0–14.0)
RDW: 12.5 % (ref 11.0–14.0)
WBC: 10.05 10*3/uL (ref 4.80–10.80)
WBC: 10.57 10*3/uL (ref 4.80–10.80)

## 2010-12-22 LAB — CK: Creatine Kinase (CK): 97 U/L (ref 19–216)

## 2010-12-22 LAB — COMPREHENSIVE METABOLIC PANEL
ALT: 28 U/L (ref 7–56)
ALT: 30 U/L (ref 7–56)
AST (SGOT): 14 U/L (ref 5–40)
AST (SGOT): 27 U/L (ref 5–40)
Albumin, Synovial: 3.7 g/dL — ABNORMAL LOW (ref 3.9–5.0)
Albumin, Synovial: 5 g/dL (ref 3.9–5.0)
Alkaline Phosphatase: 66 U/L (ref 38–126)
Alkaline Phosphatase: 94 U/L (ref 38–126)
BUN / Creatinine Ratio: 13 (ref 8–20)
BUN / Creatinine Ratio: 16 (ref 8–20)
BUN: 22 mg/dL — ABNORMAL HIGH (ref 6–20)
BUN: 22 mg/dL — ABNORMAL HIGH (ref 6–20)
Bilirubin, Total: 0.6 mg/dL (ref 0.2–1.3)
Bilirubin, Total: 0.9 mg/dL (ref 0.2–1.3)
CO2: 25 mmol/L (ref 21.0–31.0)
CO2: 26 mmol/L (ref 21.0–31.0)
Calcium: 10.1 mg/dL (ref 8.4–10.2)
Calcium: 8.9 mg/dL (ref 8.4–10.2)
Chloride: 102 mmol/L (ref 101–111)
Chloride: 95 mmol/L — ABNORMAL LOW (ref 101–111)
Creatinine: 1.39 mg/dL — ABNORMAL HIGH (ref 0.66–1.25)
Creatinine: 1.75 mg/dL — ABNORMAL HIGH (ref 0.66–1.25)
EGFR: 45 mL/min/{1.73_m2}
EGFR: 54 mL/min/{1.73_m2}
EGFR: 58 mL/min/{1.73_m2}
EGFR: >60 mL/min/{1.73_m2}
Glucose: 205 mg/dL — ABNORMAL HIGH (ref 70–100)
Glucose: 331 mg/dL — ABNORMAL HIGH (ref 70–100)
Potassium: 4.9 mmol/L (ref 3.6–5.0)
Potassium: 4.9 mmol/L (ref 3.6–5.0)
Protein, Total: 6.4 g/dL (ref 6.3–8.2)
Protein, Total: 8.7 g/dL — ABNORMAL HIGH (ref 6.3–8.2)
Sodium: 138 mmol/L (ref 135–145)
Sodium: 142 mmol/L (ref 135–145)

## 2010-12-22 LAB — URINALYSIS
Bilirubin, UA: NEGATIVE
Blood, UA: NEGATIVE
Leukocyte Esterase, UA: NEGATIVE
Nitrate: NEGATIVE
Protein, UR: NEGATIVE
Specific Gravity, UR: 1.023 (ref 1.000–1.035)
Urobilinogen, UA: NORMAL
pH, Urine: 6 (ref 5.0–8.0)

## 2010-12-22 LAB — LIPASE
Lipase: 130 U/L (ref 23–300)
Lipase: 177 U/L (ref 23–300)

## 2010-12-22 LAB — MAGNESIUM
Magnesium: 1.8 mg/dL (ref 1.7–2.2)
Magnesium: 1.9 mg/dL (ref 1.7–2.2)

## 2010-12-22 LAB — TROPONIN I: Troponin I: <0.012 ng/mL (ref 0.000–0.034)

## 2010-12-22 LAB — PHOSPHORUS: Phosphorus: 3.1 mg/dL (ref 2.4–4.4)

## 2010-12-23 LAB — CBC
Hematocrit: 43.3 % (ref 39.0–49.0)
Hemoglobin: 14.3 g/dL (ref 13.2–17.3)
MCH: 26.6 pg — ABNORMAL LOW (ref 27.0–34.0)
MCHC: 33 g/dL (ref 32.0–36.0)
MCV: 80.6 fL (ref 80–100)
MPV: 9.8 fL (ref 9.0–13.0)
Nucleated RBC %: 0 /100WBC (ref 0.0–0.0)
Nucleted RBC #: 0 10*3/uL (ref 0.00–0.00)
Platelets: 301 10*3/uL (ref 150–400)
RBC: 5.37 M/uL (ref 3.80–5.40)
RDW: 12.5 % (ref 11.0–14.0)
WBC: 9.07 10*3/uL (ref 4.80–10.80)

## 2010-12-23 LAB — COMPREHENSIVE METABOLIC PANEL
ALT: 31 U/L (ref 7–56)
AST (SGOT): 17 U/L (ref 5–40)
Albumin, Synovial: 3.6 g/dL — ABNORMAL LOW (ref 3.9–5.0)
Alkaline Phosphatase: 76 U/L (ref 38–126)
BUN / Creatinine Ratio: 13 (ref 8–20)
BUN: 15 mg/dL (ref 6–20)
Bilirubin, Total: 0.9 mg/dL (ref 0.2–1.3)
CO2: 27 mmol/L (ref 21.0–31.0)
Calcium: 9.1 mg/dL (ref 8.4–10.2)
Chloride: 103 mmol/L (ref 101–111)
Creatinine: 1.1 mg/dL (ref 0.66–1.25)
EGFR: >60 mL/min/{1.73_m2}
EGFR: >60 mL/min/{1.73_m2}
Glucose: 210 mg/dL — ABNORMAL HIGH (ref 70–100)
Potassium: 4.1 mmol/L (ref 3.6–5.0)
Protein, Total: 6.7 g/dL (ref 6.3–8.2)
Sodium: 139 mmol/L (ref 135–145)

## 2010-12-23 LAB — PHOSPHORUS: Phosphorus: 3.4 mg/dL (ref 2.4–4.4)

## 2010-12-23 LAB — MAGNESIUM: Magnesium: 1.8 mg/dL (ref 1.7–2.2)

## 2010-12-23 LAB — AMYLASE: Amylase: 41 U/L (ref 29–110)

## 2010-12-23 LAB — LIPASE: Lipase: 198 U/L (ref 23–300)

## 2010-12-24 LAB — MAGNESIUM: Magnesium: 1.8 mg/dL (ref 1.7–2.2)

## 2010-12-24 LAB — CBC
Hematocrit: 42.7 % (ref 39.0–49.0)
Hemoglobin: 14 g/dL (ref 13.2–17.3)
MCH: 26.5 pg — ABNORMAL LOW (ref 27.0–34.0)
MCHC: 32.8 g/dL (ref 32.0–36.0)
MCV: 80.7 fL (ref 80–100)
MPV: 9.7 fL (ref 9.0–13.0)
Nucleated RBC %: 0 /100WBC (ref 0.0–0.0)
Nucleted RBC #: 0 10*3/uL (ref 0.00–0.00)
Platelets: 272 10*3/uL (ref 150–400)
RBC: 5.29 M/uL (ref 3.80–5.40)
RDW: 12.4 % (ref 11.0–14.0)
WBC: 7.25 10*3/uL (ref 4.80–10.80)

## 2010-12-24 LAB — BASIC METABOLIC PANEL
BUN / Creatinine Ratio: 12 (ref 8–20)
BUN: 12 mg/dL (ref 6–20)
CO2: 26 mmol/L (ref 21.0–31.0)
Calcium: 8.9 mg/dL (ref 8.4–10.2)
Chloride: 105 mmol/L (ref 101–111)
Creatinine: 1 mg/dL (ref 0.66–1.25)
EGFR: >60 mL/min/{1.73_m2}
EGFR: >60 mL/min/{1.73_m2}
Glucose: 220 mg/dL — ABNORMAL HIGH (ref 70–100)
Potassium: 3.9 mmol/L (ref 3.6–5.0)
Sodium: 141 mmol/L (ref 135–145)

## 2010-12-24 LAB — GLYCOHEMOGLOBIN
Estimated Average Glucose: 266 mg/dL
Hemoglobin A1C: 10.9 % — ABNORMAL HIGH (ref 4.0–5.6)

## 2010-12-25 LAB — BASIC METABOLIC PANEL
BUN / Creatinine Ratio: 10 (ref 8–20)
BUN: 10 mg/dL (ref 6–20)
CO2: 28 mmol/L (ref 21.0–31.0)
Calcium: 8.8 mg/dL (ref 8.4–10.2)
Chloride: 107 mmol/L (ref 101–111)
Creatinine: 1.01 mg/dL (ref 0.66–1.25)
EGFR: >60 mL/min/{1.73_m2}
EGFR: >60 mL/min/{1.73_m2}
Glucose: 195 mg/dL — ABNORMAL HIGH (ref 70–100)
Potassium: 3.6 mmol/L (ref 3.6–5.0)
Sodium: 143 mmol/L (ref 135–145)

## 2010-12-25 LAB — CBC
Hematocrit: 39.3 % (ref 39.0–49.0)
Hemoglobin: 13 g/dL — ABNORMAL LOW (ref 13.2–17.3)
MCH: 26.5 pg — ABNORMAL LOW (ref 27.0–34.0)
MCHC: 33.1 g/dL (ref 32.0–36.0)
MCV: 80 fL (ref 80–100)
MPV: 9.5 fL (ref 9.0–13.0)
Nucleated RBC %: 0 /100WBC (ref 0.0–0.0)
Nucleted RBC #: 0 10*3/uL (ref 0.00–0.00)
Platelets: 244 10*3/uL (ref 150–400)
RBC: 4.91 M/uL (ref 3.80–5.40)
RDW: 12.3 % (ref 11.0–14.0)
WBC: 5.85 10*3/uL (ref 4.80–10.80)

## 2010-12-26 LAB — COMPREHENSIVE METABOLIC PANEL
ALT: 30 U/L (ref 7–56)
AST (SGOT): 15 U/L (ref 5–40)
Albumin, Synovial: 2.9 g/dL — ABNORMAL LOW (ref 3.9–5.0)
Alkaline Phosphatase: 65 U/L (ref 38–126)
BUN / Creatinine Ratio: 10 (ref 8–20)
BUN: 9 mg/dL (ref 6–20)
Bilirubin, Total: 0.6 mg/dL (ref 0.2–1.3)
CO2: 28 mmol/L (ref 21.0–31.0)
Calcium: 8.6 mg/dL (ref 8.4–10.2)
Chloride: 106 mmol/L (ref 101–111)
Creatinine: 0.96 mg/dL (ref 0.66–1.25)
EGFR: >60 mL/min/{1.73_m2}
EGFR: >60 mL/min/{1.73_m2}
Glucose: 181 mg/dL — ABNORMAL HIGH (ref 70–100)
Potassium: 3.7 mmol/L (ref 3.6–5.0)
Protein, Total: 5.8 g/dL — ABNORMAL LOW (ref 6.3–8.2)
Sodium: 142 mmol/L (ref 135–145)

## 2010-12-26 LAB — TRIGLYCERIDES: Triglycerides: 77 mg/dL (ref 27–125)

## 2010-12-26 LAB — CBC AND DIFFERENTIAL
BASOPHILS %: 0.4 % (ref 0.0–2.0)
Baso(Absolute): 0.03 10*3/uL (ref 0.00–0.20)
Eosinophils %: 1.1 % (ref 0.0–6.0)
Eosinophils Absolute: 0.09 10*3/uL — ABNORMAL LOW (ref 0.10–0.30)
Hematocrit: 38.9 % — ABNORMAL LOW (ref 39.0–49.0)
Hemoglobin: 13.1 g/dL — ABNORMAL LOW (ref 13.2–17.3)
Immature Granulocytes #: 0.01 10*3/uL (ref 0.00–0.05)
Immature Granulocytes %: 0.1 % — ABNORMAL HIGH (ref 0.0–0.0)
Lymphocytes Absolute: 1.48 10*3/uL (ref 1.00–4.80)
Lymphocytes Relative: 18 % — ABNORMAL LOW (ref 25.0–55.0)
MCH: 26.6 pg — ABNORMAL LOW (ref 27.0–34.0)
MCHC: 33.7 g/dL (ref 32.0–36.0)
MCV: 79.1 fL — ABNORMAL LOW (ref 80–100)
MPV: 9.7 fL (ref 9.0–13.0)
Monocytes Absolute: 0.55 10*3/uL (ref 0.10–1.20)
Monocytes Relative %: 6.7 % (ref 1.0–8.0)
Neutrophils Absolute: 6.05 10*3/uL (ref 1.80–7.70)
Neutrophils Relative %: 73.8 % — ABNORMAL HIGH (ref 49.0–69.0)
Nucleated RBC %: 0 /100WBC (ref 0.0–0.0)
Nucleted RBC #: 0 10*3/uL (ref 0.00–0.00)
Platelets: 233 10*3/uL (ref 150–400)
RBC: 4.92 M/uL (ref 3.80–5.40)
RDW: 12.2 % (ref 11.0–14.0)
WBC: 8.2 10*3/uL (ref 4.80–10.80)

## 2010-12-26 LAB — MAGNESIUM: Magnesium: 1.7 mg/dL (ref 1.7–2.2)

## 2010-12-26 LAB — PHOSPHORUS: Phosphorus: 3.8 mg/dL (ref 2.4–4.4)

## 2010-12-26 LAB — PT (PROTHROMBIN TIME)
PT INR: 1.1
PT: 14.1 s (ref 12.6–15.0)

## 2010-12-27 LAB — BASIC METABOLIC PANEL
BUN / Creatinine Ratio: 11 (ref 8–20)
BUN: 10 mg/dL (ref 6–20)
CO2: 29 mmol/L (ref 21.0–31.0)
Calcium: 8.4 mg/dL (ref 8.4–10.2)
Chloride: 105 mmol/L (ref 101–111)
Creatinine: 0.92 mg/dL (ref 0.66–1.25)
EGFR: >60 mL/min/{1.73_m2}
EGFR: >60 mL/min/{1.73_m2}
Glucose: 215 mg/dL — ABNORMAL HIGH (ref 70–100)
Potassium: 3.5 mmol/L — ABNORMAL LOW (ref 3.6–5.0)
Sodium: 141 mmol/L (ref 135–145)

## 2010-12-27 LAB — PREALBUMIN: Prealbumin: 22.2 mg/dL (ref 20.0–40.0)

## 2010-12-28 LAB — BASIC METABOLIC PANEL
BUN / Creatinine Ratio: 11 (ref 8–20)
BUN: 10 mg/dL (ref 6–20)
CO2: 26 mmol/L (ref 21.0–31.0)
Calcium: 8.5 mg/dL (ref 8.4–10.2)
Chloride: 106 mmol/L (ref 101–111)
Creatinine: 0.93 mg/dL (ref 0.66–1.25)
EGFR: >60 mL/min/{1.73_m2}
EGFR: >60 mL/min/{1.73_m2}
Glucose: 194 mg/dL — ABNORMAL HIGH (ref 70–100)
Potassium: 3.4 mmol/L — ABNORMAL LOW (ref 3.6–5.0)
Sodium: 140 mmol/L (ref 135–145)

## 2010-12-28 LAB — MAGNESIUM: Magnesium: 1.6 mg/dL — ABNORMAL LOW (ref 1.7–2.2)

## 2010-12-28 LAB — PHOSPHORUS: Phosphorus: 3.7 mg/dL (ref 2.4–4.4)

## 2010-12-29 LAB — RENAL FUNCTION PANEL
Albumin, Synovial: 2.7 g/dL — ABNORMAL LOW (ref 3.9–5.0)
BUN: 9 mg/dL (ref 6–20)
CO2: 25 mmol/L (ref 21.0–31.0)
Calcium: 8 mg/dL — ABNORMAL LOW (ref 8.4–10.2)
Chloride: 105 mmol/L (ref 101–111)
Creatinine: 0.82 mg/dL (ref 0.66–1.25)
EGFR: >60 mL/min/{1.73_m2}
EGFR: >60 mL/min/{1.73_m2}
Glucose: 210 mg/dL — ABNORMAL HIGH (ref 70–100)
Phosphorus: 3.3 mg/dL (ref 2.4–4.4)
Potassium: 3.4 mmol/L — ABNORMAL LOW (ref 3.6–5.0)
Sodium: 137 mmol/L (ref 135–145)

## 2010-12-29 LAB — BASIC METABOLIC PANEL

## 2010-12-29 LAB — SODIUM: Sodium: 138 mmol/L (ref 135–145)

## 2010-12-30 LAB — CBC
Hematocrit: 35.2 % — ABNORMAL LOW (ref 39.0–49.0)
Hemoglobin: 12.2 g/dL — ABNORMAL LOW (ref 13.2–17.3)
MCH: 26.6 pg — ABNORMAL LOW (ref 27.0–34.0)
MCHC: 34.7 g/dL (ref 32.0–36.0)
MCV: 76.9 fL — ABNORMAL LOW (ref 80–100)
MPV: 9.7 fL (ref 9.0–13.0)
Nucleated RBC %: 0 /100WBC (ref 0.0–0.0)
Nucleted RBC #: 0 10*3/uL (ref 0.00–0.00)
Platelets: 244 10*3/uL (ref 150–400)
RBC: 4.58 M/uL (ref 3.80–5.40)
RDW: 12.3 % (ref 11.0–14.0)
WBC: 7.35 10*3/uL (ref 4.80–10.80)

## 2010-12-30 LAB — MAGNESIUM: Magnesium: 1.8 mg/dL (ref 1.7–2.2)

## 2010-12-30 LAB — BASIC METABOLIC PANEL
BUN / Creatinine Ratio: 11 (ref 8–20)
BUN: 9 mg/dL (ref 6–20)
CO2: 27 mmol/L (ref 21.0–31.0)
Calcium: 8.3 mg/dL — ABNORMAL LOW (ref 8.4–10.2)
Chloride: 104 mmol/L (ref 101–111)
Creatinine: 0.82 mg/dL (ref 0.66–1.25)
EGFR: >60 mL/min/{1.73_m2}
EGFR: >60 mL/min/{1.73_m2}
Glucose: 253 mg/dL — ABNORMAL HIGH (ref 70–100)
Potassium: 3.6 mmol/L (ref 3.6–5.0)
Sodium: 137 mmol/L (ref 135–145)

## 2010-12-30 LAB — PHOSPHORUS: Phosphorus: 3.5 mg/dL (ref 2.4–4.4)

## 2010-12-30 NOTE — H&P (Signed)
[name], [name]                                                    MRN:          [id number]                                                          Account:      [id number]                                                     Document ID:  [id number]                                                                                                                                    MRN: [id number]  Document ID: [id number]  Admit Date: 12/21/2010     Patient Location: [id number]   Patient Type: I     ATTENDING PHYSICIAN: Terri Piedra, MD        ADMITTING DIAGNOSIS:  Diabetic ketoacidosis.     HISTORY OF PRESENT ILLNESS:  This is a 35 year old black male who has a history of insulin-dependent  diabetes mellitus,  diabetic gastroparesis and recent cholecystectomy on  December 07, 2010 due to biliary dyskinesia.  The patient had had persistent  abdominal pain and was found to have significant biliary dyskinesia and  underwent laparoscopic cholecystectomy with some degree of acalculous  cholecystitis.  Subsequently, the patient has remained on insulin.  He has  intermittent abdominal pain.  He presented to the emergency room today with  complaints of nausea and vomiting for 16 hours prior to admission.  The  pain was moderate.  The patient states that he has taken his Lantus  consistently.  In the emergency room, he was found to have an elevated  anion gap and a blood glucose in the 400s.  He is being admitted to the  intensive care unit for insulin treatment and fluids.  The patient had mild  abdominal discomfort.  There was no diarrhea.  There was no coffee-ground  emesis or hematemesis.     PAST MEDICAL HISTORY:  Insulin-dependent diabetes mellitus, history of diabetic gastroparesis, EGD  in September 2012 by Dr. Welton Flakes showing hemorrhagic gastritis and multiple  gastric erosions.  History of left anterior cruciate ligament surgery, left  knee, hypertension.     MEDICATIONS:  Prilosec, Carafate, Protonix, lisinopril, Lantus 35  units daily  and a  short-acting p.r.n., Zofran, hydrochlorothiazide 25 mg daily.     ALLERGIES:  None.     REVIEW OF SYSTEMS:  Chronic reflux and GI symptoms as above.  No chest pain, no shortness of  breath, no neurologic complaints.                                                                                                           Page 1 of 2  [name]                                                    MRN:          [id number]                                                          Account:      [id number]                                                     Document ID:  [id number] 11 [id number]                                                                                                                                       PHYSICAL EXAMINATION:  GENERAL:  Well-developed black male in no acute respiratory distress.  VITAL SIGNS:  Blood pressure 155/70, pulse 104, respirations 18, oxygen  saturation on 2 liters 100%.  HEENT:  No trauma.  Eyes are anicteric.  Mouth is moist.  NECK:  Supple, no JVD.  LUNGS:  Clear.  HEART:  Regular.  Normal sounds.  ABDOMEN:  Mild diffuse tenderness, no rebound, no hepatosplenomegaly.  EXTREMITIES:  No clubbing, cyanosis or edema.  SKIN:  No rash.  NEUROLOGIC:  Intact.     LABORATORY DATA:  EKG:  Sinus tachycardia, otherwise normal.  Troponin less than 0.01.   Phosphorus 3.1.  Magnesium 1.9.  White count 10.0, hemoglobin 14.1,  platelets 306.  Lipase 130,  CK 97.  BUN 22, potassium 4.9, glucose 331,  bicarbonate 25, anion gap greater than 20, creatinine 1.75.  Transaminases  normal.  Calcium 10.1.  Lipase 177.  Magnesium 1.8.     IMPRESSION:  A 35 year old male presented with diabetic ketoacidosis, symptoms of  gastroparesis with nausea and vomiting and abdominal pain.     PLAN:  Intravenous fluids and insulin, analgesic treatment, antiemetics.   Gastroenterology follow up with Dr. Welton Flakes.              D:  12/22/2010 19:46 PM by Durward Fortes, MD ([id number])  T:  12/23/2010 07:24  AM by Marylene Buerger      Everlean Cherry: [id number]) (Doc ID: [id number])        cc: Burt Knack MD   Gala Romney MD                                                                                                           Page 2 of 2  Authenticated by Kirstie Mirza, MD On 12/30/2010 78:29:56 PM

## 2010-12-31 LAB — CBC AND DIFFERENTIAL
BASOPHILS %: 0.5 % (ref 0.0–2.0)
Baso(Absolute): 0.03 10*3/uL (ref 0.00–0.20)
Eosinophils %: 3.5 % (ref 0.0–6.0)
Eosinophils Absolute: 0.22 10*3/uL (ref 0.10–0.30)
Hematocrit: 35 % — ABNORMAL LOW (ref 39.0–49.0)
Hemoglobin: 11.9 g/dL — ABNORMAL LOW (ref 13.2–17.3)
Immature Granulocytes #: 0.02 10*3/uL (ref 0.00–0.05)
Immature Granulocytes %: 0.3 % — ABNORMAL HIGH (ref 0.0–0.0)
Lymphocytes Absolute: 1.86 10*3/uL (ref 1.00–4.80)
Lymphocytes Relative: 29.9 % (ref 25.0–55.0)
MCH: 26.4 pg — ABNORMAL LOW (ref 27.0–34.0)
MCHC: 34 g/dL (ref 32.0–36.0)
MCV: 77.8 fL — ABNORMAL LOW (ref 80–100)
MPV: 9.9 fL (ref 9.0–13.0)
Monocytes Absolute: 0.64 10*3/uL (ref 0.10–1.20)
Monocytes Relative %: 10.3 % — ABNORMAL HIGH (ref 1.0–8.0)
Neutrophils Absolute: 3.47 10*3/uL (ref 1.80–7.70)
Neutrophils Relative %: 55.8 % (ref 49.0–69.0)
Nucleated RBC %: 0 /100WBC (ref 0.0–0.0)
Nucleted RBC #: 0 10*3/uL (ref 0.00–0.00)
Platelets: 267 10*3/uL (ref 150–400)
RBC: 4.5 M/uL (ref 3.80–5.40)
RDW: 12.5 % (ref 11.0–14.0)
WBC: 6.22 10*3/uL (ref 4.80–10.80)

## 2010-12-31 LAB — BASIC METABOLIC PANEL
BUN / Creatinine Ratio: 17 (ref 8–20)
BUN: 15 mg/dL (ref 6–20)
CO2: 25 mmol/L (ref 21.0–31.0)
Calcium: 8.5 mg/dL (ref 8.4–10.2)
Chloride: 104 mmol/L (ref 101–111)
Creatinine: 0.88 mg/dL (ref 0.66–1.25)
EGFR: >60 mL/min/{1.73_m2}
EGFR: >60 mL/min/{1.73_m2}
Glucose: 128 mg/dL — ABNORMAL HIGH (ref 70–100)
Potassium: 3.6 mmol/L (ref 3.6–5.0)
Sodium: 137 mmol/L (ref 135–145)

## 2010-12-31 LAB — MAGNESIUM: Magnesium: 1.9 mg/dL (ref 1.7–2.2)

## 2010-12-31 LAB — PHOSPHORUS: Phosphorus: 4.1 mg/dL (ref 2.4–4.4)

## 2011-01-02 NOTE — Consults (Signed)
[name], [name]                                                    MRN:          [id number]                                                          Account:      [id number]                                                     Document ID:  [id number]                                               Service Date: 12/23/2010                                                                                    MRN: [id number]  Document ID: [id number]  Admit Date: 12/21/2010     Patient Location: [id number]   Patient Type: I     CONSULTING PHYSICIAN: Loriann Bosserman A Enriqueta Augusta MD     REFERRING PHYSICIAN:         REASON FOR CONSULTATION:  The patient is seen at the request of Dr. Lesle Reek, intensive care  attending for evaluation of recurrent abdominal pain, nausea, and vomiting.     HISTORY OF PRESENT ILLNESS:  The patient is known to GI service from his recurrent and multiple  hospitalizations.  He was evaluated initially in September of 2012 with  similar symptoms of epigastric abdominal pain, nausea, and vomiting.  The  patient has a longstanding history of diabetes and has had no history of  noncompliance for control and elevated hemoglobin A1c levels.  The patient  also had a history of diabetic gastroparesis related to poorly controlled  diabetes and weight and having multiple hospitalizations with nausea and  intractable vomiting.  He has undergone extensive workup which included  multiple abdominal ultrasounds and abdominal CAT scans.  He also has had  x-rays of his abdomen including a CT scan of the head to rule out  neurological causes of recurrent abdominal pain.  The abdominal CT done  last in September of 2012 was reportedly normal without any evidence of  bowel obstruction.  His abdominal ultrasound had shown no evidence of  hepatobiliary problems.  During his last hospitalization in late October,  he was evaluated by Dr. Dollene Primrose of GI service.  After reviewing his previous  workup including the endoscopy, the patient was  evaluated further with CCK  HIDA scan to rule out biliary dyskinesia.  Workup at that time revealed  severe biliary dyskinesia and after consultation with general surgery he  underwent cholecystectomy.  The patient did well postoperatively and he was  discharged home, then discharged home.  He states that he was taking  oxycodone and Percocet for his pain symptoms.  He also stopped taking his  Reglan on a regular basis, though he continued with Protonix and Carafate  supplementation.  The patient did well on a bland diet and subsequently  started to improve, started to advance the diet slowly.  One day prior to  hospitalization, he states that he ate something fatty and rich and after  that he started to have the symptoms of nausea and recurrent emesis.  Due  to persistent symptoms of nausea, vomiting, and severe abdominal pain, he  was hospitalized.  He was also noted to be in diabetic ketoacidosis with                                                                                                           Page 1 of 4  [name], [name]                                                    MRN:          [id number]                                                          Account:      [id number]                                                     Document ID:  [id number] 12 [id number]                                               Service Date: 12/23/2010                                                                                    elevated glucose levels.  He is being treated with intravenous insulin in  the intensive care unit.  He continues to have symptoms of abdominal pain.   Abdominal pain is mostly in the epigastric region.  He denies any radiation  of the pain to the back.  He denies any hematemesis.  He is mostly having  symptoms of bilious emesis.  He denies any dysphagia or odynophagia.  He  denies any fever or chills.  He denies any urinary complaints.  He has no  itching, dark-colored urine or bright red blood per  rectum.  He denies any  diarrhea.     PAST MEDICAL HISTORY:  History of longstanding, poorly controlled diabetes.  History of  gastroparesis.  Endoscopy done in September showing hemorrhagic gastritis  and short-segment Barrett esophagus with H. pylori negative antral  erosions.  History of chronic back pain requiring use of pain medications  in the past.  The patient also has prior history of microcytosis without a  history of sickle cell disease.  Hypertension and hyperlipidemia.     PAST SURGICAL HISTORY:  Includes recent history of cholecystectomy and prior history of knee  surgery.     ALLERGIES:  No known drug allergies.     MEDICATIONS:  His regular medications include Neurontin, Lantus, hematological, Lipitor,  lisinopril, Protonix, Percocet, and oxycodone for postoperative pain.  He  is currently on insulin, pantoprazole, Dilaudid, promethazine, Zofran,  Carafate, and intravenous hydration.     SOCIAL HISTORY:  He is a smoker but denies any history of drug use or substance abuse.     FAMILY HISTORY:  Unremarkable for GI disease or GI malignancies.     REVIEW OF SYSTEMS:  Essentially as above.  He denies any chest pains.  He denies any abdominal  swelling.  He denies any dysphagia or odynophagia.  He denies any fever or  chills.  He has no rash, joint pains or joint swelling.     LABORATORY DATA:  Shows a WBC count of 10.0, hemoglobin of 14, hematocrit of 42 and platelet  count of 306.  Serum amylase and lipase levels were normal.  CK levels were  normal.  Comprehensive metabolic panel on admission showed a creatinine of                                                                                                           Page 2 of 4  [name], [name]                                                    MRN:          [id number]  Account:      [id number]                                                     Document ID:  [id number] 12 [id number]                                                Service Date: 12/23/2010                                                                                    1.75 with a bicarbonate of 25, BUN of 22 and total protein of 8.7.  His  liver profile was normal.     IMPRESSION:  This is a 35 year old gentleman who is admitted to the hospital with  recurrent episodes of nausea and vomiting.  The patient has prior  longstanding history of diabetes with poor diabetic control and diabetic  gastroparesis.  Prior endoscopy had shown H. pylori negative gastritis and  short segment Barrett's esophagus.  Prior extensive workup has been  negative for other GI etiologies as well as CT scan negative for  neurological cause of recurrent nausea and vomiting.  The current  hospitalization is probably with exaggerated symptoms of gastroparesis  leading to nausea and vomiting.  The patient was taking Percocet and  oxycodone after his recent gallbladder surgery.     RECOMMENDATIONS:  1.  I had a long discussion with the patient regarding further management  of his symptoms.  I agree with current treatment of bowel rest.  IV  hydration.  Correction of fluid and electrolyte balance and treatment of  uncontrolled diabetes and diabetic ketoacidosis.    2.  Continue with proton pump inhibitor and Carafate.  I would start the  patient on prokinetic agent, Reglan, on a regular basis.  3.  We will obtain MR enterography for further evaluation of his chronic nausea   and vomiting and to rule out small bowel etiology or partial obstruction.  4.  I also advised the patient regarding abstinence or decreasing the use  of narcotic analgesics.  5.  His diet will be advanced to low-residue diet.  6.  If MR enterography is negative, further evaluation with a gastric  emptying study might be necessary.  That study will require to be off  narcotic analgesics.  5.  Further recommendation would depend on patient's response to current  treatment.  The compliance issues and management  were reviewed in detail  with the patient.     Thank you for the consultation.              D:  12/23/2010 14:37 PM by Zaveon Gillen A. Welton Flakes, MD [phone])  T:  12/24/2010 12:27 PM by Marylene Buerger      Everlean Cherry: [id number]) (Doc ID: [id number])  Page 3 of 4  [name], [name]                                                    MRN:          [id number]                                                          Account:      [id number]                                                     Document ID:  [id number] 12 000000                                               Service Date: 12/23/2010                                                                                    cc:                                                                                                                Page 4 of 4  Authenticated and Edited by Treonna Klee A. Welton Flakes, M.D. On 01/02/11 6:23:46 PM

## 2011-01-10 ENCOUNTER — Inpatient Hospital Stay
Admission: EM | Admit: 2011-01-10 | Disposition: A | Discharge status: Home / Self Care | Payer: Self-pay | Source: Emergency Department | Admitting: Internal Medicine

## 2011-01-10 LAB — CBC AND DIFFERENTIAL
BASOPHILS %: 0.3 % (ref 0.0–2.0)
Baso(Absolute): 0.03 10*3/uL (ref 0.00–0.20)
Eosinophils %: 0.1 % (ref 0.0–6.0)
Eosinophils Absolute: 0.01 10*3/uL — ABNORMAL LOW (ref 0.10–0.30)
Hematocrit: 44.6 % (ref 39.0–49.0)
Hemoglobin: 14.9 g/dL (ref 13.2–17.3)
Immature Granulocytes #: 0.04 10*3/uL (ref 0.00–0.05)
Immature Granulocytes %: 0.4 % — ABNORMAL HIGH (ref 0.0–0.0)
Lymphocytes Absolute: 1.09 10*3/uL (ref 1.00–4.80)
Lymphocytes Relative: 10.9 % — ABNORMAL LOW (ref 25.0–55.0)
MCH: 26.9 pg — ABNORMAL LOW (ref 27.0–34.0)
MCHC: 33.4 g/dL (ref 32.0–36.0)
MCV: 80.5 fL (ref 80–100)
MPV: 9.9 fL (ref 9.0–13.0)
Monocytes Absolute: 0.34 10*3/uL (ref 0.10–1.20)
Monocytes Relative %: 3.4 % (ref 1.0–8.0)
Neutrophils Absolute: 8.52 10*3/uL — ABNORMAL HIGH (ref 1.80–7.70)
Neutrophils Relative %: 85.3 % — ABNORMAL HIGH (ref 49.0–69.0)
Nucleated RBC %: 0 /100WBC (ref 0.0–0.0)
Nucleted RBC #: 0 10*3/uL (ref 0.00–0.00)
Platelets: 339 10*3/uL (ref 150–400)
RBC: 5.54 M/uL — ABNORMAL HIGH (ref 3.80–5.40)
RDW: 13 % (ref 11.0–14.0)
WBC: 9.99 10*3/uL (ref 4.80–10.80)

## 2011-01-10 LAB — COMPREHENSIVE METABOLIC PANEL
ALT: 36 U/L (ref 7–56)
AST (SGOT): 27 U/L (ref 5–40)
Albumin, Synovial: 4.7 g/dL (ref 3.9–5.0)
Alkaline Phosphatase: 118 U/L (ref 38–126)
BUN / Creatinine Ratio: 16 (ref 8–20)
BUN: 18 mg/dL (ref 6–20)
Bilirubin, Total: 0.9 mg/dL (ref 0.2–1.3)
CO2: 27 mmol/L (ref 21.0–31.0)
Calcium: 10 mg/dL (ref 8.4–10.2)
Chloride: 95 mmol/L — ABNORMAL LOW (ref 101–111)
Creatinine: 1.09 mg/dL (ref 0.66–1.25)
EGFR: >60 mL/min/{1.73_m2}
EGFR: >60 mL/min/{1.73_m2}
Glucose: 292 mg/dL — ABNORMAL HIGH (ref 70–100)
Potassium: 5.4 mmol/L — ABNORMAL HIGH (ref 3.6–5.0)
Protein, Total: 8.1 g/dL (ref 6.3–8.2)
Sodium: 140 mmol/L (ref 135–145)

## 2011-01-10 LAB — AMYLASE: Amylase: 81 U/L (ref 29–110)

## 2011-01-10 LAB — MORPH REVIEW
Cell Morphology:: NORMAL
Platelet Evaluation: NORMAL

## 2011-01-10 LAB — CK: Creatine Kinase (CK): 111 U/L (ref 19–216)

## 2011-01-10 LAB — TROPONIN I: Troponin I: 0.013 ng/mL (ref 0.000–0.034)

## 2011-01-10 LAB — MYOGLOBIN, SERUM: Myoglobin: 54.7 ng/mL (ref 0.0–121.0)

## 2011-01-10 LAB — MAGNESIUM: Magnesium: 1.6 mg/dL — ABNORMAL LOW (ref 1.7–2.2)

## 2011-01-10 LAB — LIPASE: Lipase: 213 U/L (ref 23–300)

## 2011-01-11 LAB — URINALYSIS
Bilirubin, UA: NEGATIVE
Blood, UA: NEGATIVE
Ketones UA: NEGATIVE
Leukocyte Esterase, UA: NEGATIVE
Nitrate: NEGATIVE
Protein, UR: NEGATIVE
Specific Gravity, UR: 1.014 (ref 1.000–1.035)
Urobilinogen, UA: NORMAL
pH, Urine: 7 (ref 5.0–8.0)

## 2011-01-11 LAB — COMPREHENSIVE METABOLIC PANEL
ALT: 31 U/L (ref 7–56)
AST (SGOT): 12 U/L (ref 5–40)
Albumin, Synovial: 3.2 g/dL — ABNORMAL LOW (ref 3.9–5.0)
Alkaline Phosphatase: 74 U/L (ref 38–126)
BUN / Creatinine Ratio: 15 (ref 8–20)
BUN: 13 mg/dL (ref 6–20)
Bilirubin, Total: 0.7 mg/dL (ref 0.2–1.3)
CO2: 28 mmol/L (ref 21.0–31.0)
Calcium: 8.8 mg/dL (ref 8.4–10.2)
Chloride: 101 mmol/L (ref 101–111)
Creatinine: 0.87 mg/dL (ref 0.66–1.25)
EGFR: >60 mL/min/{1.73_m2}
EGFR: >60 mL/min/{1.73_m2}
Glucose: 150 mg/dL — ABNORMAL HIGH (ref 70–100)
Potassium: 4.3 mmol/L (ref 3.6–5.0)
Protein, Total: 5.9 g/dL — ABNORMAL LOW (ref 6.3–8.2)
Sodium: 139 mmol/L (ref 135–145)

## 2011-01-11 LAB — TOXICOLOGY SCREEN, SERUM
Barbiturate Screen, UR: NEGATIVE
Benzodiazepine Screen, UR: NEGATIVE
Cocaine Screen: NEGATIVE
Opiate Screen: NEGATIVE
PCP Screen: NEGATIVE
THC Screen: NEGATIVE
Urine Amphetamine Screen: NEGATIVE

## 2011-01-11 LAB — CBC AND DIFFERENTIAL
BASOPHILS %: 0.3 % (ref 0.0–2.0)
Baso(Absolute): 0.02 10*3/uL (ref 0.00–0.20)
Eosinophils %: 0.9 % (ref 0.0–6.0)
Eosinophils Absolute: 0.07 10*3/uL — ABNORMAL LOW (ref 0.10–0.30)
Hematocrit: 38 % — ABNORMAL LOW (ref 39.0–49.0)
Hemoglobin: 12.5 g/dL — ABNORMAL LOW (ref 13.2–17.3)
Immature Granulocytes #: 0.02 10*3/uL (ref 0.00–0.05)
Immature Granulocytes %: 0.3 % — ABNORMAL HIGH (ref 0.0–0.0)
Lymphocytes Absolute: 2.06 10*3/uL (ref 1.00–4.80)
Lymphocytes Relative: 27.2 % (ref 25.0–55.0)
MCH: 26.5 pg — ABNORMAL LOW (ref 27.0–34.0)
MCHC: 32.9 g/dL (ref 32.0–36.0)
MCV: 80.7 fL (ref 80–100)
MPV: 9.6 fL (ref 9.0–13.0)
Monocytes Absolute: 0.45 10*3/uL (ref 0.10–1.20)
Monocytes Relative %: 6 % (ref 1.0–8.0)
Neutrophils Absolute: 4.96 10*3/uL (ref 1.80–7.70)
Neutrophils Relative %: 65.6 % (ref 49.0–69.0)
Nucleated RBC %: 0 /100WBC (ref 0.0–0.0)
Nucleted RBC #: 0 10*3/uL (ref 0.00–0.00)
Platelets: 308 10*3/uL (ref 150–400)
RBC: 4.71 M/uL (ref 3.80–5.40)
RDW: 12.9 % (ref 11.0–14.0)
WBC: 7.56 10*3/uL (ref 4.80–10.80)

## 2011-01-11 LAB — MAGNESIUM: Magnesium: 1.9 mg/dL (ref 1.7–2.2)

## 2011-01-11 NOTE — H&P (Signed)
[name], [name]                                                    MRN:          [id number]                                                          Account:      [id number]                                                     Document ID:  [id number] 11 [id number]                                                                                                                                    MRN: [id number]  Document ID: [id number]  Admit Date: 01/10/2011     Patient Location: [id number]   Patient Type: O     ATTENDING PHYSICIAN: Dorthula Nettles, MD        CHIEF COMPLAINT:  Nausea and vomiting.     HISTORY OF PRESENT ILLNESS:  The patient is a 35 year old male with a past medical history of diabetes,  hypertension, diabetic gastroparesis, history of hemorrhagic gastritis, and  multiple gastric erosions per EGD in September 2012 by Dr. Welton Flakes, here with  complaints of nausea and vomiting that occurred this morning at 10:00 in  the morning and continued to occur during the whole day.  The patient  denies any hematemesis, denies any diarrhea or blood in stool, denies any  constipation.  The patient was recently in the hospital.  He was admitted  on December 21, 2010, and discharged on January 01, 2011, secondary to  diabetic ketoacidosis.  The patient denies any headache, denies any  dizziness, denies any blurriness of vision, denies any chest pain, denies  any shortness of breath, denies any cough, denies any fever or chills,  denies any chest pain or shortness of breath, denies any numbness or  weakness in bilateral upper or lower extremities.  He does have epigastric  abdominal pain but is secondary to the nausea and vomiting.  The epigastric  pain is sharp, nonradiating, localized.     PAST MEDICAL HISTORY:  Diabetes, hypertension, history of diabetic gastroparesis, as well as a  hemorrhagic gastritis and multiple gastric erosions per EGD in September  2012 by Dr. Welton Flakes.     PAST SURGICAL HISTORY:  Includes a laparoscopic  cholecystectomy as well as a left anterior cruciate  ligament surgery.     MEDICATIONS:  At home include Reglan 10 mg p.o. q.6 h., hydrochlorothiazide 25 mg p.o.  daily, Zofran 4 mg p.o. q.6 h. p.r.n. for nausea, Protonix 40 mg p.o.  daily, Lantus 42 units subcutaneously nightly, lisinopril 20 mg p.o. daily,  Carafate, Humalog insulin sliding scale with meals.                                                                                                              Page 1 of 3  [name], [name]                                                    MRN:          [id number]                                                          Account:      [id number]                                                     Document ID:  [id number] 11 [id number]                                                                                                                                    ALLERGIES:  No known drug allergies.     SOCIAL HISTORY:  Negative for tobacco abuse, alcohol abuse, or illicit drug use.     FAMILY HISTORY:  Significant for diabetes.  Denies any history of coronary artery disease in  the family.     REVIEW OF SYSTEMS:  See history of present illness for further details.     PHYSICAL EXAMINATION:  GENERAL:  The patient is awake, alert, oriented x3.  VITAL SIGNS:  Blood pressure currently is 120/62, pulse 109, respirations  15, temperature of 97.8, saturating 99% on room air.  HEENT:  Normocephalic, atraumatic.  Pupils were equal, round, and reactive  to light.  EOMs intact.  Conjunctiva was not anemic, sclerae was  nonicteric.  NECK:  Negative JVD, negative carotid bruit, negative thyromegaly.   Negative lymphadenopathy on neck and supra and infraclavicular area.  CARDIAC:  S1, S2, regular rate and rhythm, negative murmur, negative  gallop.  PULMONARY:  Lungs are clear to auscultation.  Negative wheezing.  Negative  rales.  ABDOMEN:  Positive bowel sounds.  Slight tenderness in the epigastric area.   Negative guarding, negative  rebound, negative distention, negative  hepatosplenomegaly, negative CVA tenderness.  EXTREMITIES:  Negative edema, bilateral lower extremities.  Pulses were  palpable, DP and PT, bilaterally.  NEUROLOGIC:  Cranial nerves II through XII were grossly intact.  There was  no gross motoric or sensory deficit in bilateral upper or lower  extremities.     DIAGNOSTIC STUDIES:  CBC shows white blood cells 9.9, hemoglobin 14.9, platelets 339.  Amylase  was 81.  Lipase 213.  Magnesium 1.6.  A troponin was 0.013.  CMP shows BUN  of 18, sodium 140, potassium 5.4 but slightly hemolyzed, chloride is 95,  CO2 27, glucose 292, creatinine is 1.0, calcium 10.0, total bilirubin 0.9,  alkaline phosphatase is 118, AST 27, ALT 36, GFR is more than 60.  A  myoglobin is 54.7.  CK is 111.  A chest x-ray shows no acute  cardiopulmonary disease.     ASSESSMENT AND PLAN:                                                                                                           Page 2 of 3  [name], [name]                                                    MRN:          [id number]                                                          Account:      [id number]                                                     Document ID:  [id number] 11 [id number]  1.  Intractable vomiting.  This is most likely secondary to the diabetic  gastroparesis he has.  I am obtaining a supine x-ray to rule out free air  or any signs of bowel obstruction.  Once that is clear, I will start the  patient on Reglan, Protonix, Zofran.  IV fluid hydration and keep the  patient n.p.o. for now.  We will consult GI (Dr. Welton Flakes) in the morning if  this does not resolve.  2.  Gastroparesis with a history of hemorrhagic gastritis and gastric  erosion in the past.  Plan is to start Reglan and Protonix.  We will obtain  a GI consult in the morning from Dr. Welton Flakes if this  does not resolve tonight.  3.  Diabetes.  Plan is to continue his Lantus as well as placing him on  insulin sliding scale with NovoLog.  4.  Hypertension.  I am holding his medications for now since he will not  be able to tolerate it and will use metoprolol IV if needed.  5.  Gastrointestinal and deep venous thrombosis prophylaxis.  I am starting  the patient on Protonix and TEDs and sequential compression devices for  bilateral lower extremities.              D:  01/11/2011 02:28 AM by Dorthula Nettles, MD ([id number])  T:  01/11/2011 09:57 AM by Netta Cedars      Everlean Cherry: [id number]) (Doc ID: [id number])        cc:                                                                                                                Page 3 of 3  Authenticated and Edited by Dorthula Nettles, MD On 01/11/11 11:39:07 PM

## 2011-01-12 LAB — BASIC METABOLIC PANEL
BUN / Creatinine Ratio: 12 (ref 8–20)
BUN: 13 mg/dL (ref 6–20)
CO2: 28 mmol/L (ref 21.0–31.0)
Calcium: 8.6 mg/dL (ref 8.4–10.2)
Chloride: 106 mmol/L (ref 101–111)
Creatinine: 1.08 mg/dL (ref 0.66–1.25)
EGFR: >60 mL/min/{1.73_m2}
EGFR: >60 mL/min/{1.73_m2}
Glucose: 105 mg/dL — ABNORMAL HIGH (ref 70–100)
Potassium: 4 mmol/L (ref 3.6–5.0)
Sodium: 140 mmol/L (ref 135–145)

## 2011-01-12 LAB — MAGNESIUM: Magnesium: 1.8 mg/dL (ref 1.7–2.2)

## 2011-01-12 LAB — PHOSPHORUS: Phosphorus: 4 mg/dL (ref 2.4–4.4)

## 2011-01-13 LAB — COMPREHENSIVE METABOLIC PANEL
ALT: 34 U/L (ref 7–56)
AST (SGOT): 15 U/L (ref 5–40)
Albumin, Synovial: 3.1 g/dL — ABNORMAL LOW (ref 3.9–5.0)
Alkaline Phosphatase: 56 U/L (ref 38–126)
BUN / Creatinine Ratio: 14 (ref 8–20)
BUN: 14 mg/dL (ref 6–20)
Bilirubin, Total: 0.8 mg/dL (ref 0.2–1.3)
CO2: 27 mmol/L (ref 21.0–31.0)
Calcium: 9 mg/dL (ref 8.4–10.2)
Chloride: 106 mmol/L (ref 101–111)
Creatinine: 0.99 mg/dL (ref 0.66–1.25)
EGFR: >60 mL/min/{1.73_m2}
EGFR: >60 mL/min/{1.73_m2}
Glucose: 66 mg/dL — ABNORMAL LOW (ref 70–100)
Potassium: 3.6 mmol/L (ref 3.6–5.0)
Protein, Total: 6 g/dL — ABNORMAL LOW (ref 6.3–8.2)
Sodium: 143 mmol/L (ref 135–145)

## 2011-01-13 LAB — CBC
Hematocrit: 39.7 % (ref 39.0–49.0)
Hemoglobin: 13.3 g/dL (ref 13.2–17.3)
MCH: 26.5 pg — ABNORMAL LOW (ref 27.0–34.0)
MCHC: 33.5 g/dL (ref 32.0–36.0)
MCV: 79.2 fL — ABNORMAL LOW (ref 80–100)
MPV: 9.8 fL (ref 9.0–13.0)
Nucleated RBC %: 0 /100WBC (ref 0.0–0.0)
Nucleted RBC #: 0 10*3/uL (ref 0.00–0.00)
Platelets: 291 10*3/uL (ref 150–400)
RBC: 5.01 M/uL (ref 3.80–5.40)
RDW: 12.6 % (ref 11.0–14.0)
WBC: 6.29 10*3/uL (ref 4.80–10.80)

## 2011-01-13 LAB — MAGNESIUM: Magnesium: 1.9 mg/dL (ref 1.7–2.2)

## 2011-01-13 LAB — T4, FREE: T4 Free: 1.18 ng/dL (ref 0.78–2.19)

## 2011-01-13 LAB — TSH, 3RD GENERATION: TSH, 3rd Generation: 1.38 mIU/L (ref 0.465–4.680)

## 2011-01-14 LAB — GLYCOHEMOGLOBIN
Estimated Average Glucose: 237 mg/dL
Hemoglobin A1C: 9.9 % — ABNORMAL HIGH (ref 4.0–5.6)

## 2011-01-15 LAB — BASIC METABOLIC PANEL
BUN / Creatinine Ratio: 8 (ref 8–20)
BUN: 8 mg/dL (ref 6–20)
CO2: 26 mmol/L (ref 21.0–31.0)
Calcium: 8.4 mg/dL (ref 8.4–10.2)
Chloride: 105 mmol/L (ref 101–111)
Creatinine: 0.95 mg/dL (ref 0.66–1.25)
EGFR: >60 mL/min/{1.73_m2}
EGFR: >60 mL/min/{1.73_m2}
Glucose: 78 mg/dL (ref 70–100)
Potassium: 3.1 mmol/L — ABNORMAL LOW (ref 3.6–5.0)
Sodium: 140 mmol/L (ref 135–145)

## 2011-01-15 LAB — CORTISOL: Cortisol, Serum or Plasma: 16.6 ug/dL

## 2011-01-15 LAB — MAGNESIUM: Magnesium: 1.6 mg/dL — ABNORMAL LOW (ref 1.7–2.2)

## 2011-01-16 NOTE — Discharge Summary (Signed)
[name], [name]                                                    MRN:          [id number]                                                          Account:      [id number]                                                     Document ID:  [id number]                                                                                                                                    MRN: [id number]  Document ID: [id number]  Admit Date: 01/10/2011  Discharge Date: 01/15/2011     ATTENDING PHYSICIAN:  Dorann Ou, MD        DISCHARGE CONDITION:  Stable.  Discharged to home.     DISCHARGE ACTIVITY:  As tolerated.     DIET:  2000-calorie ADA diet with small frequent meals.     DISCHARGE ACTIVITY:  As tolerated.     DISCHARGE MEDICATIONS:  Please see medication-reconciliation list done along with this discharge  summary.     DISCHARGE DIAGNOSES:  1.  Recurrent intractable vomiting and nausea secondary to severe diabetic  gastroparesis, now resolved.  The patient is able to tolerate a regular  diet.  2.  Epigastric abdominal pain consistent with underlying gastritis as well  as gastroparesis, now improved.  The patient is to continue on PPI.  3.  Diabetes, uncontrolled.  HbA1c of 9.9.  Medications adjusted.   Endocrinology consultation obtained.  Outpatient followup.  4.  Hypertension.  Restart outpatient medications for better blood pressure  control.  5.  Status post cholecystectomy in October 2012 for biliary dyskinesia.  6.  Hypokalemia, replaced.  7.  Hypomagnesemia, replaced.     HOSPITAL COURSE:  Please refer to the detailed history and physical done at the time of  admission.  The patient has been admitted several times in the past with  past medical history as stated above.  He is well known to our service and  came in with nausea, vomiting, and abdominal pain, intractable nausea and  vomiting, unable to initially tolerate p.o.  The patient was placed n.p.o.  and continued on IV fluids.  Dr. Welton Flakes from  gastroenterology was consulted.                                                                                                            Page 1 of 3  JAHLEEL, STROSCHEIN                                                    MRN:          [id number]                                                          Account:      [id number]                                                     Document ID:  [id number]                                                                                                                                    The patient's condition slowly started to improve.  He was thought to have  underlying gastritis with gastroparesis with uncontrolled diabetes causing  the patient to have nausea, vomiting, and abdominal pain.  His condition  slowly started to improve.  Diet was advanced.  The patient is now  tolerating p.o. diet at 2000-calorie, low-fat, low-residue diet.  He is  stable and cleared.  No nausea or vomiting noted.  Abdominal pain is also  much improved.  The patient has had extensive workup in the past including  a cholecystectomy for biliary dyskinesis.  The patient's HbA1c was noted to  be 9.1 and he appears to have uncontrolled diabetes.  Endocrinology  consultation was obtained with Dr. Lucio Edward. who evaluated the patient.   The patient will continue to follow with her as an outpatient for better  control.  At this time, the patient is on Lantus 42 units and blood sugars  are fairly well controlled at 83%.  Diabetic education has been provided to  the patient.  He has been asked to take small frequent meals and says he  will try to be compliant as an outpatient with current diabetic control,  outpatient followup, as well as a small and frequent meal intake.  The  patient is to continue on Reglan, Protonix, and sucralfate.  He will also  continue on his outpatient medications of lisinopril and  hydrochlorothiazide.     DISPOSITION:   The patient is stable and cleared for discharge today,  to continue followup  as discussed above.  Electrolytes including hypomagnesemia and hypokalemia  have been replaced.  Serum random cortisol levels were noted to be normal  at 16.6.  Abdominal x-ray done at the time of admission showed only a  nonobstructive pattern with some mild constipation.  Lipase was noted to be  normal at 213.     CONSULTANTS:  1.  Dr. Gala Romney, gastroenterology.  2.  Dr. Lucio Edward, endocrinology.     DISCHARGE FOLLOWUP:  1.  Primary care physician in 1 week.  2.  Follow up with Dr. Welton Flakes in 1 to 2 weeks.    3.  Follow up with Dr. Lucio Edward in 1 week.     I spent 45 minutes discussing with the patient and consultants arranging  for discharge today.           Electronic Signing Provider  _______________________________     Date/Time Signed: _____________  Lana Fish MD ([id number])                                                                                                           Page 2 of 3  KYREN, KNICK                                                    MRN:          [id number]                                                          Account:      [id number]                                                     Document ID:  [id number]  D:  01/15/2011 11:27 AM by Dr. Lana Fish, MD ([id number])  T:  01/16/2011 10:55 AM by [facility]      Everlean Cherry: [id number]) (Doc ID: [id number])           cc: Burt Knack MD   MITRA DASTGHEYB MD   Gala Romney MD                                                                                                           Page 3 of 3  Authenticated by Lana Fish, MD On 01/16/2011 05:38:43 PM

## 2011-01-17 NOTE — Discharge Summary (Signed)
[name], [name]                                                    MRN:          [id number]                                                          Account:      [id number]                                                     Document ID:  [id number]                                                                                                                                    MRN: [id number]  Document ID: [id number]  Admit Date: 12/21/2010  Discharge Date: 01/01/2011     ATTENDING PHYSICIAN:  Terri Piedra, MD        PRIMARY CARE PHYSICIAN:  Dr. Burt Knack.     CONSULTATION:  Dr. Gala Romney, gastroenterology     DISCHARGE DIAGNOSES:  1.  Diabetic ketoacidosis, resolved.  2.  Intractable abdominal pain, nausea, and vomiting secondary to diabetic  gastroparesis, improved.  3.  Uncontrolled hypertension.  4.  Status post recent cholecystectomy in October 2012 for biliary  dyskinesia.   5.  Medication noncompliance.  6.  Gastritis with multiple gastric erosions on a recent  esophagogastroduodenoscopy done in September 2012.  7.  Acute kidney injury secondary to dehydration, resolved.     DISCHARGE MEDICATIONS:  Reglan 10 mg four times a day before meals, Lantus 42 units subcutaneously  nightly, Protonix 40 mg daily, Zofran 4 mg every 6 hours p.r.n. nausea,  vomiting, hydrochlorothiazide 25 mg daily, lisinopril 40 mg daily, and  Carafate 1 gram t.i.d. before meals and at bedtime.     HISTORY OF PRESENT ILLNESS:  This is a 35 year old African American male with a history of uncontrolled  diabetes, diabetic gastroparesis with recurrent admissions for abdominal  pain, nausea, and vomiting, who presented to the emergency room on December 21, 2010 with complaints of abdominal pain, nausea, and vomiting for 1 day  prior to admission.  In the emergency room, he was found to have an  elevated anion gap and his blood glucose was 400.  He was initially  admitted to the ICU for diabetic ketoacidosis      HOSPITAL COURSE:  The patient  was initially admitted to the ICU with diagnosis of DKA.  He  was started on insulin drip.  He was subsequently transferred to floor the  next day, as his anion gap was closed.  He was transitioned to subcutaneous                                                                                                           Page 1 of 3  JUANDANIEL, MANFREDO                                                    MRN:          [id number]                                                          Account:      [id number]                                                     Document ID:  [id number]                                                                                                                                    Lantus and insulin sliding scale.  The patient continued to have  intractable abdominal pain, nausea, and vomiting.  Gastroenterology  consultation was obtained with Dr. Welton Flakes.  The patient was placed on bowel  rest.  He was started on TPN for nutrition.  The patient had a CT of the  abdomen with and without contrast on December 25, 2010, which showed no CT  evidence of pancreatitis or pancreatic lesion.  There was no small-bowel  obstruction.  There was mild stranding and fluid at the  post-cholecystectomy bed.  The patient also had a gastric emptying study on  December 26, 2010, which showed delayed liquid phase gastric emptying,  which was about 119 minutes.  Normal half time of liquid gastric emptying  is less than 65 minutes.  He was started on IV Reglan for gastroparesis.   IV Protonix was given for gastritis.  His symptoms improved with bowel rest  and TPN.  He was slowly started on a clear liquid diet, which he tolerated  very well.  His diet was advanced to full liquids.  TPN was discontinued.   The patient had fluctuating blood glucose levels throughout the hospital  stay.  He was strictly advised to follow up with an endocrinologist as an  outpatient given the uncontrolled blood sugars.  For  hypertension, his  lisinopril was restarted.  The patient can initiate hydrochlorothiazide  upon discharge.  His creatinine was found to be 1.7 at the time of  admission secondary to dehydration.  His creatinine was normal at 0.8 on  the day of discharge.  Dietary consultation was obtained for  recommendations about diet.  He was instructed to eat a low-fat, low fiber  diet with small frequent meals to avoid over distention of the stomach.     DISPOSITION:  The patient was discharged home.     DISCHARGE CONDITION:   Stable.     DISCHARGE FOLLOWUP:   1.  Dr. Burt Knack in 1 week.  2.  GI, Dr. Welton Flakes, in 1 to 2 weeks.  3.  Outpatient endocrinology followup as per referral from primary care  physician.              D:  01/01/2011 12:30 PM by Dr. Aura Dials ADI REDDY, MD ([id number])  T:  01/02/2011 09:53 AM by RB      (Conf: [id number]) (Doc ID: [id number])                                                                                                                    Page 2 of 3  VEARL, ALLBAUGH                                                    MRN:          [id number]                                                          Account:      [id number]                                                     Document ID:  [id number]  cc: [facility] WEAVER MD   Gala Romney MD                                                                                                           Page 3 of 3  Authenticated by Julien Girt, MD On 01/17/2011 09:06:48 AM

## 2011-02-13 HISTORY — PX: CHOLECYSTECTOMY: SHX55

## 2011-11-10 LAB — ECG 12-LEAD
Atrial Rate: 85 {beats}/min
Atrial Rate: 98 {beats}/min
Atrial Rate: 120 {beats}/min
Atrial Rate: 108 {beats}/min
Atrial Rate: 102 {beats}/min
Atrial Rate: 123 {beats}/min
Atrial Rate: 114 {beats}/min
P Axis: 31 degrees
P Axis: 40 degrees
P Axis: 69 degrees
P Axis: 61 degrees
P Axis: 66 degrees
P Axis: 68 degrees
P Axis: 63 degrees
P-R Interval: 168 ms
P-R Interval: 174 ms
P-R Interval: 156 ms
P-R Interval: 164 ms
P-R Interval: 154 ms
P-R Interval: 166 ms
P-R Interval: 170 ms
Q-T Interval: 350 ms
Q-T Interval: 366 ms
Q-T Interval: 322 ms
Q-T Interval: 336 ms
Q-T Interval: 306 ms
Q-T Interval: 354 ms
Q-T Interval: 318 ms
QRS Duration: 84 ms
QRS Duration: 86 ms
QRS Duration: 82 ms
QRS Duration: 86 ms
QRS Duration: 84 ms
QRS Duration: 88 ms
QRS Duration: 78 ms
QTC Calculation (Bezet): 435 ms
QTC Calculation (Bezet): 446 ms
QTC Calculation (Bezet): 455 ms
QTC Calculation (Bezet): 450 ms
QTC Calculation (Bezet): 438 ms
QTC Calculation (Bezet): 461 ms
QTC Calculation (Bezet): 438 ms
R Axis: 46 degrees
R Axis: 47 degrees
R Axis: 60 degrees
R Axis: 49 degrees
R Axis: 55 degrees
R Axis: 57 degrees
R Axis: 46 degrees
T Axis: 9 degrees
T Axis: 9 degrees
T Axis: 26 degrees
T Axis: 42 degrees
T Axis: 48 degrees
T Axis: 50 degrees
T Axis: 6 degrees
Ventricular Rate: 85 {beats}/min
Ventricular Rate: 98 {beats}/min
Ventricular Rate: 120 {beats}/min
Ventricular Rate: 108 {beats}/min
Ventricular Rate: 102 {beats}/min
Ventricular Rate: 123 {beats}/min
Ventricular Rate: 114 {beats}/min

## 2012-06-17 ENCOUNTER — Inpatient Hospital Stay
Admission: EM | Admit: 2012-06-17 | Discharge: 2012-06-25 | DRG: 638 | Disposition: A | Discharge status: Home / Self Care | Payer: BC Managed Care – PPO | Attending: Family Medicine | Admitting: Family Medicine

## 2012-06-17 ENCOUNTER — Inpatient Hospital Stay: Payer: BC Managed Care – PPO | Admitting: Internal Medicine

## 2012-06-17 ENCOUNTER — Emergency Department: Payer: BC Managed Care – PPO

## 2012-06-17 DIAGNOSIS — K2990 Gastroduodenitis, unspecified, without bleeding: Secondary | ICD-10-CM | POA: Diagnosis present

## 2012-06-17 DIAGNOSIS — K2970 Gastritis, unspecified, without bleeding: Secondary | ICD-10-CM | POA: Diagnosis present

## 2012-06-17 DIAGNOSIS — Z794 Long term (current) use of insulin: Secondary | ICD-10-CM

## 2012-06-17 DIAGNOSIS — K219 Gastro-esophageal reflux disease without esophagitis: Secondary | ICD-10-CM | POA: Diagnosis present

## 2012-06-17 DIAGNOSIS — Z9089 Acquired absence of other organs: Secondary | ICD-10-CM

## 2012-06-17 DIAGNOSIS — E1142 Type 2 diabetes mellitus with diabetic polyneuropathy: Secondary | ICD-10-CM | POA: Diagnosis present

## 2012-06-17 DIAGNOSIS — Z9119 Patient's noncompliance with other medical treatment and regimen: Secondary | ICD-10-CM

## 2012-06-17 DIAGNOSIS — E785 Hyperlipidemia, unspecified: Secondary | ICD-10-CM | POA: Diagnosis present

## 2012-06-17 DIAGNOSIS — E1049 Type 1 diabetes mellitus with other diabetic neurological complication: Secondary | ICD-10-CM | POA: Diagnosis present

## 2012-06-17 DIAGNOSIS — Z91199 Patient's noncompliance with other medical treatment and regimen due to unspecified reason: Secondary | ICD-10-CM

## 2012-06-17 DIAGNOSIS — E1149 Type 2 diabetes mellitus with other diabetic neurological complication: Secondary | ICD-10-CM

## 2012-06-17 DIAGNOSIS — I10 Essential (primary) hypertension: Secondary | ICD-10-CM | POA: Diagnosis present

## 2012-06-17 DIAGNOSIS — K567 Ileus, unspecified: Secondary | ICD-10-CM

## 2012-06-17 DIAGNOSIS — E1143 Type 2 diabetes mellitus with diabetic autonomic (poly)neuropathy: Secondary | ICD-10-CM

## 2012-06-17 DIAGNOSIS — E1110 Type 2 diabetes mellitus with ketoacidosis without coma: Secondary | ICD-10-CM | POA: Diagnosis present

## 2012-06-17 DIAGNOSIS — R112 Nausea with vomiting, unspecified: Secondary | ICD-10-CM

## 2012-06-17 DIAGNOSIS — Z833 Family history of diabetes mellitus: Secondary | ICD-10-CM

## 2012-06-17 DIAGNOSIS — E860 Dehydration: Secondary | ICD-10-CM | POA: Diagnosis present

## 2012-06-17 DIAGNOSIS — K3184 Gastroparesis: Secondary | ICD-10-CM | POA: Diagnosis present

## 2012-06-17 DIAGNOSIS — E1065 Type 1 diabetes mellitus with hyperglycemia: Secondary | ICD-10-CM | POA: Diagnosis present

## 2012-06-17 DIAGNOSIS — N179 Acute kidney failure, unspecified: Secondary | ICD-10-CM | POA: Diagnosis present

## 2012-06-17 DIAGNOSIS — K560 Paralytic ileus: Secondary | ICD-10-CM

## 2012-06-17 DIAGNOSIS — E1010 Type 1 diabetes mellitus with ketoacidosis without coma: Principal | ICD-10-CM | POA: Diagnosis present

## 2012-06-17 LAB — COMPREHENSIVE METABOLIC PANEL
ALT: 36 U/L (ref 0–55)
AST (SGOT): 31 U/L (ref 5–34)
Albumin/Globulin Ratio: 1.1 (ref 0.9–2.2)
Albumin: 4.6 g/dL (ref 3.5–5.0)
Alkaline Phosphatase: 122 U/L (ref 40–150)
Anion Gap: 20 — ABNORMAL HIGH (ref 5.0–15.0)
BUN: 16.4 mg/dL (ref 9.0–21.0)
Bilirubin, Total: 0.9 mg/dL (ref 0.2–1.2)
CO2: 17 — ABNORMAL LOW (ref 22–29)
Calcium: 10 mg/dL (ref 8.5–10.5)
Chloride: 100 (ref 98–107)
Creatinine: 1.8 mg/dL — ABNORMAL HIGH (ref 0.7–1.3)
Globulin: 4.2 g/dL — ABNORMAL HIGH (ref 2.0–3.6)
Glucose: 396 mg/dL — ABNORMAL HIGH (ref 70–100)
Potassium: 5.3 — ABNORMAL HIGH (ref 3.5–5.1)
Protein, Total: 8.8 g/dL — ABNORMAL HIGH (ref 6.0–8.3)
Sodium: 137 (ref 136–145)

## 2012-06-17 LAB — ARTERIAL BLOOD GAS (~~LOC~~)
Base Excess, Arterial: -2.6 — ABNORMAL LOW (ref <=2.0)
FIO2: 21 %
HCO3, Arterial: 23.7 (ref 22.0–28.0)
O2 Sat, Arterial: 94 % — ABNORMAL LOW (ref 95.0–100.0)
Temperature: 98.6
pCO2, Arterial: 46 — ABNORMAL HIGH (ref 35.0–45.0)
pH, Arterial: 7.32 — ABNORMAL LOW (ref 7.35–7.45)
pO2, Arterial: 76 — ABNORMAL LOW (ref 80.0–100.0)

## 2012-06-17 LAB — CBC AND DIFFERENTIAL
Basophils Absolute Automated: 0.03 (ref 0.00–0.20)
Basophils Automated: 0 %
Eosinophils Absolute Automated: 0.25 (ref 0.00–0.70)
Eosinophils Automated: 2 %
Hematocrit: 51.3 % (ref 42.0–52.0)
Hgb: 17.4 g/dL — ABNORMAL HIGH (ref 13.0–17.0)
Immature Granulocytes Absolute: 0.04
Immature Granulocytes: 0 %
Lymphocytes Absolute Automated: 2.23 (ref 0.50–4.40)
Lymphocytes Automated: 21 %
MCH: 26.9 pg — ABNORMAL LOW (ref 28.0–32.0)
MCHC: 33.9 g/dL (ref 32.0–36.0)
MCV: 79.4 fL — ABNORMAL LOW (ref 80.0–100.0)
MPV: 10.6 fL (ref 9.4–12.3)
Monocytes Absolute Automated: 0.36 (ref 0.00–1.20)
Monocytes: 3 %
Neutrophils Absolute: 7.91 (ref 1.80–8.10)
Neutrophils: 73 %
Platelets: 223 (ref 140–400)
RBC: 6.46 — ABNORMAL HIGH (ref 4.70–6.00)
RDW: 13 % (ref 12–15)
WBC: 10.78 (ref 3.50–10.80)

## 2012-06-17 LAB — POCT GLUCOSE
Whole Blood Glucose POCT: 353 mg/dL — AB (ref 70–100)
Whole Blood Glucose POCT: 278 mg/dL — AB (ref 70–100)
Whole Blood Glucose POCT: 187 mg/dL — AB (ref 70–100)
Whole Blood Glucose POCT: 212 mg/dL — AB (ref 70–100)
Whole Blood Glucose POCT: 172 mg/dL — AB (ref 70–100)
Whole Blood Glucose POCT: 165 mg/dL — AB (ref 70–100)
Whole Blood Glucose POCT: 181 mg/dL — AB (ref 70–100)

## 2012-06-17 LAB — GFR: EGFR: 52

## 2012-06-17 LAB — LIPASE: Lipase: 41 U/L (ref 8–78)

## 2012-06-17 MED ORDER — HEPARIN SODIUM (PORCINE) 5000 UNIT/ML IJ SOLN
5000.0000 [IU] | Freq: Three times a day (TID) | INTRAMUSCULAR | Status: DC
Start: 2012-06-17 — End: 2012-06-25
  Administered 2012-06-17 – 2012-06-25 (×23): 5000 [IU] via SUBCUTANEOUS
  Filled 2012-06-17 (×12): qty 1
  Filled 2012-06-17: qty 2
  Filled 2012-06-17: qty 1
  Filled 2012-06-17: qty 2
  Filled 2012-06-17 (×6): qty 1

## 2012-06-17 MED ORDER — ONDANSETRON HCL 4 MG/2ML IJ SOLN
4.0000 mg | Freq: Four times a day (QID) | INTRAMUSCULAR | Status: DC | PRN
Start: 2012-06-17 — End: 2012-06-20
  Administered 2012-06-17 – 2012-06-20 (×5): 4 mg via INTRAVENOUS
  Filled 2012-06-17 (×5): qty 2

## 2012-06-17 MED ORDER — METOCLOPRAMIDE HCL 5 MG/ML IJ SOLN
10.0000 mg | Freq: Four times a day (QID) | INTRAMUSCULAR | Status: DC
Start: 2012-06-17 — End: 2012-06-18
  Administered 2012-06-17 – 2012-06-18 (×4): 10 mg via INTRAVENOUS
  Filled 2012-06-17 (×4): qty 2

## 2012-06-17 MED ORDER — SUCRALFATE 1 GM/10ML PO SUSP
1.0000 g | Freq: Four times a day (QID) | ORAL | Status: DC
Start: 2012-06-17 — End: 2012-06-23
  Administered 2012-06-17 – 2012-06-23 (×15): 1 g via ORAL
  Filled 2012-06-17 (×19): qty 10

## 2012-06-17 MED ORDER — DEXTROSE 50 % IV SOLN
50.0000 mL | INTRAVENOUS | Status: DC | PRN
Start: 2012-06-17 — End: 2012-06-18

## 2012-06-17 MED ORDER — HYDRALAZINE HCL 20 MG/ML IJ SOLN
10.0000 mg | Freq: Four times a day (QID) | INTRAMUSCULAR | Status: DC | PRN
Start: 2012-06-17 — End: 2012-06-18

## 2012-06-17 MED ORDER — PANTOPRAZOLE SODIUM 40 MG IV SOLR
40.0000 mg | Freq: Once | INTRAVENOUS | Status: AC
Start: 2012-06-17 — End: 2012-06-17
  Administered 2012-06-17: 40 mg via INTRAVENOUS
  Filled 2012-06-17: qty 40

## 2012-06-17 MED ORDER — SODIUM CHLORIDE 0.9 % IV SOLN
5.0000 [IU]/h | INTRAVENOUS | Status: DC
Start: 2012-06-17 — End: 2012-06-17
  Administered 2012-06-17: 5 [IU]/h via INTRAVENOUS
  Filled 2012-06-17: qty 1

## 2012-06-17 MED ORDER — DEXTROSE-SODIUM CHLORIDE 5-0.9 % IV SOLN
150.0000 mL/h | INTRAVENOUS | Status: DC | PRN
Start: 2012-06-17 — End: 2012-06-18
  Administered 2012-06-17 – 2012-06-18 (×2): 150 mL/h via INTRAVENOUS

## 2012-06-17 MED ORDER — PROMETHAZINE HCL 25 MG/ML IJ SOLN
12.5000 mg | Freq: Once | INTRAMUSCULAR | Status: AC
Start: 2012-06-17 — End: 2012-06-17
  Administered 2012-06-17: 12.5 mg via INTRAVENOUS
  Filled 2012-06-17: qty 1

## 2012-06-17 MED ORDER — SODIUM CHLORIDE 0.9 % IV BOLUS
1000.0000 mL | INTRAVENOUS | Status: AC
Start: 2012-06-17 — End: 2012-06-17
  Administered 2012-06-17 (×3): 1000 mL via INTRAVENOUS

## 2012-06-17 MED ORDER — SODIUM CHLORIDE 0.9 % IV SOLN
0.5000 [IU]/h | INTRAVENOUS | Status: DC
Start: 2012-06-17 — End: 2012-06-18
  Administered 2012-06-17: 1 [IU]/h via INTRAVENOUS
  Filled 2012-06-17: qty 1

## 2012-06-17 MED ORDER — INSULIN REGULAR HUMAN 100 UNIT/ML IJ SOLN
8.0000 [IU] | Freq: Once | INTRAMUSCULAR | Status: AC
Start: 2012-06-17 — End: 2012-06-17
  Administered 2012-06-17: 8 [IU] via INTRAVENOUS
  Filled 2012-06-17: qty 3

## 2012-06-17 MED ORDER — DEXTROSE 50 % IV SOLN
25.00 mL | INTRAVENOUS | Status: DC | PRN
Start: 2012-06-17 — End: 2012-06-18

## 2012-06-17 MED ORDER — SODIUM CHLORIDE 0.9 % IV BOLUS
2000.00 mL | Freq: Once | INTRAVENOUS | Status: AC
Start: 2012-06-17 — End: 2012-06-17
  Administered 2012-06-17: 2000 mL via INTRAVENOUS

## 2012-06-17 MED ORDER — HYDROMORPHONE HCL PF 1 MG/ML IJ SOLN
1.0000 mg | Freq: Once | INTRAMUSCULAR | Status: AC
Start: 2012-06-17 — End: 2012-06-17
  Administered 2012-06-17: 1 mg via INTRAVENOUS
  Filled 2012-06-17: qty 1

## 2012-06-17 MED ORDER — HYDROMORPHONE HCL PF 1 MG/ML IJ SOLN
0.2000 mg | Freq: Four times a day (QID) | INTRAMUSCULAR | Status: DC | PRN
Start: 2012-06-17 — End: 2012-06-18
  Administered 2012-06-17: 0.2 mg via INTRAVENOUS
  Filled 2012-06-17: qty 1

## 2012-06-17 NOTE — H&P (Signed)
[facility]- Critical Care Note      ADMISSION- HISTORY AND PHYSICAL EXAM      Date Time: 06/17/2012 7:42 PM  Patient Name: [name]  Attending Physician: Leticia Clas, MD  Primary Care Physician: [name], MD  Location/Room: 14/14     Assessment:   Problem List: There is no problem list on file for this patient.    DKA  Gastritis  GERD  DM  Gastroparesis  Dehydration   AKI    Plan:   Endocrine: DKA most likely from dietary noncompliance.  Cont insulin gtt. Change to long acting when anion gap closes.    GI: gastroparesis: reglan, GERD/gastritis: Cont protonix , carafate.     Cardio: dehydration, cont aggressive iv fluids.    HTN: hold outpt lisinopril given AKI. Start prn meds, can restart lisinopril when creatinine improves    Nutrition: carb consistent diet    Renal /Fluid, Electrolytes : AKI from dehydration. Cont iv hydration      Prophylaxis:   GI Prophylaxis:  On protonix  VTE Prophylaxis:+      Code Status: full code  I have personally reviewed the patient's history and 24 hour interval events, along with vitals, labs, radiology images, and nurses report.     Chief Complaint / Primary Reason for ICU evaluation :    No chief complaint on file.    DKA    History of Presenting Illness:   [name] is a 37 y.o. male IDDM, h/o DKA, gastroparesis, gastritis, GERD who presents to the hospital abdominal pain and vomiting since this morning.   He was found to be in DKA. He received iv fluids and insulin gtt.      On examination, he currently feels much better after the lantus and iv fluids. No chest pain. No shortness of breath. He had not been compliant with his diet, he had eaten fast foods.  He state he was compliant with lantus. No URI symptoms.     Past Medical History:     Past Medical History   Diagnosis Date   . Diabetes    . Hypertensive disorder        Available old records reviewed, including:  Prior hospitalization 2012    Past Surgical History:     Past Surgical History    Procedure Date   . Cholecystectomy        Family History:   No family history on file.    Social History:     History     Social History   . Marital Status: Single     Spouse Name: N/A     Number of Children: N/A   . Years of Education: N/A     Social History Main Topics   . Smoking status: Not on file   . Smokeless tobacco: Never Used   . Alcohol Use: No   . Drug Use: No   . Sexually Active: Not on file     Other Topics Concern   . Not on file     Social History Narrative   . No narrative on file       Allergies:   No Known Allergies        Medications:     (Not in a hospital admission)    Current Inpatient :  Current Facility-Administered Medications   Medication Dose Route Frequency   . [COMPLETED] HYDROmorphone  1 mg Intravenous Once   . [COMPLETED] insulin regular  8 Units Intravenous  Once   . [COMPLETED] pantoprazole  40 mg Intravenous Once   . [COMPLETED] promethazine  12.5 mg Intravenous Once   . [COMPLETED] sodium chloride  2,000 mL Intravenous Once       Home Medications :     Prior to Admission medications    Medication Sig Start Date End Date Taking? Authorizing Provider   insulin glargine (LANTUS) 100 UNIT/ML injection Inject into the skin nightly.   Yes [provider]   lisinopril (PRINIVIL,ZESTRIL) 20 MG tablet Take 20 mg by mouth daily.   Yes [provider]            Review of Systems:   All other systems were reviewed and are negative except per HPI.     Physical Exam:     Filed Vitals:    06/17/12 1908   BP: 156/88   Pulse: 94   Temp:    Resp: 18   SpO2: 97%       Intake and Output Summary (Last 24 hours) at Date Time  No intake or output data in the 24 hours ending 06/17/12 1942      GENERAL: NAD, comfortable on stretcher   HEENT: Pupils are 2 mm, equal, reactive to light and accommodation. dry mucosa with no thrush in oropharynx.   CARDIOVASCULAR: RRR,  normal S1, normal S2. No murmurs, rubs, or gallops.   PULMONARY: Clear to auscultation bilaterally.   ABDOMEN: Mild  epigastric tenderness. No rebound, soft, positive bowel sounds.   EXTREMITIES: No lower extremity edema.      Labs:     Labs (last 72 hours):  Recent Labs   Basename 06/17/12 1629    WBC 10.78    HGB 17.4*    HCT 51.3    LABPLAT --     No results found for this basename: PT:3,INR:3,PTT:3 in the last 72 hours Recent Labs   [facility] 06/17/12 1629    NA 137    K 5.3*    CL 100    CO2 17*    BUN 16.4    CREAT 1.8*    GLU 396*    CA 10.0    MG --    PHOS --               Lipase 41    Radiology / Imaging:     Imaging personally reviewed by me  including:   CXR  No infiltrate, prominent pulmonary vasculature      Signed by: Lawernce Ion  JY:[id number], Vania Rea, MD

## 2012-06-17 NOTE — ED Notes (Signed)
Pt here with c/o vomiting and upper abd pain since this morning. Pt very diaphoretic. BS 353 in triage.

## 2012-06-18 LAB — POCT GLUCOSE
Whole Blood Glucose POCT: 160 mg/dL — AB (ref 70–100)
Whole Blood Glucose POCT: 172 mg/dL — AB (ref 70–100)
Whole Blood Glucose POCT: 176 mg/dL — AB (ref 70–100)
Whole Blood Glucose POCT: 182 mg/dL — AB (ref 70–100)
Whole Blood Glucose POCT: 197 mg/dL — AB (ref 70–100)
Whole Blood Glucose POCT: 201 mg/dL — AB (ref 70–100)
Whole Blood Glucose POCT: 201 mg/dL — AB (ref 70–100)
Whole Blood Glucose POCT: 207 mg/dL — AB (ref 70–100)
Whole Blood Glucose POCT: 216 mg/dL — AB (ref 70–100)
Whole Blood Glucose POCT: 217 mg/dL — AB (ref 70–100)
Whole Blood Glucose POCT: 221 mg/dL — AB (ref 70–100)
Whole Blood Glucose POCT: 243 mg/dL — AB (ref 70–100)
Whole Blood Glucose POCT: 243 mg/dL — AB (ref 70–100)
Whole Blood Glucose POCT: 260 mg/dL — AB (ref 70–100)

## 2012-06-18 LAB — GFR
EGFR: >60
EGFR: >60

## 2012-06-18 LAB — CBC
Hematocrit: 38.9 % — ABNORMAL LOW (ref 42.0–52.0)
Hematocrit: 39.3 % — ABNORMAL LOW (ref 42.0–52.0)
Hgb: 13 g/dL (ref 13.0–17.0)
Hgb: 13 g/dL (ref 13.0–17.0)
MCH: 26.7 pg — ABNORMAL LOW (ref 28.0–32.0)
MCH: 26.5 pg — ABNORMAL LOW (ref 28.0–32.0)
MCHC: 33.4 g/dL (ref 32.0–36.0)
MCHC: 33.1 g/dL (ref 32.0–36.0)
MCV: 79.9 fL — ABNORMAL LOW (ref 80.0–100.0)
MCV: 80 fL (ref 80.0–100.0)
MPV: 10.2 fL (ref 9.4–12.3)
MPV: 9.8 fL (ref 9.4–12.3)
Platelets: 216 (ref 140–400)
Platelets: 203 (ref 140–400)
RBC: 4.87 (ref 4.70–6.00)
RBC: 4.91 (ref 4.70–6.00)
RDW: 13 % (ref 12–15)
RDW: 13 % (ref 12–15)
WBC: 9.38 (ref 3.50–10.80)
WBC: 8.19 (ref 3.50–10.80)

## 2012-06-18 LAB — BASIC METABOLIC PANEL
Anion Gap: 6 (ref 5.0–15.0)
Anion Gap: 7 (ref 5.0–15.0)
BUN: 12.6 mg/dL (ref 9.0–21.0)
BUN: 11.4 mg/dL (ref 9.0–21.0)
CO2: 25 (ref 22–29)
CO2: 25 (ref 22–29)
Calcium: 7.9 mg/dL — ABNORMAL LOW (ref 8.5–10.5)
Calcium: 8.7 mg/dL (ref 8.5–10.5)
Chloride: 110 — ABNORMAL HIGH (ref 98–107)
Chloride: 107 (ref 98–107)
Creatinine: 1.2 mg/dL (ref 0.7–1.3)
Creatinine: 1.3 mg/dL (ref 0.7–1.3)
Glucose: 176 mg/dL — ABNORMAL HIGH (ref 70–100)
Glucose: 244 mg/dL — ABNORMAL HIGH (ref 70–100)
Potassium: 3.7 (ref 3.5–5.1)
Potassium: 4.1 (ref 3.5–5.1)
Sodium: 141 (ref 136–145)
Sodium: 139 (ref 136–145)

## 2012-06-18 LAB — MAGNESIUM: Magnesium: 1.8 mg/dL (ref 1.6–2.6)

## 2012-06-18 MED ORDER — SODIUM CHLORIDE 0.45 % IV SOLN
INTRAVENOUS | Status: DC
Start: 2012-06-18 — End: 2012-06-19

## 2012-06-18 MED ORDER — GLUCAGON HCL (RDNA) 1 MG IJ SOLR
1.0000 mg | INTRAMUSCULAR | Status: DC | PRN
Start: 2012-06-18 — End: 2012-06-18

## 2012-06-18 MED ORDER — INSULIN ASPART 100 UNIT/ML SC SOLN
1.00 [IU] | Freq: Four times a day (QID) | SUBCUTANEOUS | Status: DC
Start: 2012-06-18 — End: 2012-06-22
  Administered 2012-06-18 (×2): 3 [IU] via SUBCUTANEOUS
  Administered 2012-06-19 (×2): 1 [IU] via SUBCUTANEOUS
  Administered 2012-06-19 (×2): 3 [IU] via SUBCUTANEOUS
  Administered 2012-06-20: 1 [IU] via SUBCUTANEOUS
  Administered 2012-06-20: 3 [IU] via SUBCUTANEOUS
  Administered 2012-06-21: 1 [IU] via SUBCUTANEOUS
  Administered 2012-06-21: 3 [IU] via SUBCUTANEOUS
  Administered 2012-06-21: 1 [IU] via SUBCUTANEOUS
  Filled 2012-06-18 (×2): qty 10
  Filled 2012-06-18 (×2): qty 30
  Filled 2012-06-18 (×2): qty 10
  Filled 2012-06-18 (×2): qty 30
  Filled 2012-06-18: qty 10
  Filled 2012-06-18 (×2): qty 30

## 2012-06-18 MED ORDER — FAMOTIDINE 20 MG PO TABS
20.0000 mg | ORAL_TABLET | Freq: Two times a day (BID) | ORAL | Status: DC
Start: 2012-06-18 — End: 2012-06-21
  Administered 2012-06-18 – 2012-06-21 (×5): 20 mg via ORAL
  Filled 2012-06-18 (×7): qty 1

## 2012-06-18 MED ORDER — MAGNESIUM SULFATE IN D5W 10-5 MG/ML-% IV SOLN
1.0000 g | Freq: Once | INTRAVENOUS | Status: AC
Start: 2012-06-18 — End: 2012-06-18
  Administered 2012-06-18: 1 g via INTRAVENOUS
  Filled 2012-06-18: qty 100

## 2012-06-18 MED ORDER — POTASSIUM CHLORIDE CRYS ER 20 MEQ PO TBCR
20.00 meq | EXTENDED_RELEASE_TABLET | Freq: Once | ORAL | Status: AC
Start: 2012-06-18 — End: 2012-06-18
  Administered 2012-06-18: 20 meq via ORAL
  Filled 2012-06-18: qty 1

## 2012-06-18 MED ORDER — INSULIN GLARGINE 100 UNIT/ML SC SOLN
30.00 [IU] | Freq: Every evening | SUBCUTANEOUS | Status: DC
Start: 2012-06-18 — End: 2012-06-21
  Administered 2012-06-18 – 2012-06-20 (×3): 30 [IU] via SUBCUTANEOUS
  Filled 2012-06-18 (×3): qty 300

## 2012-06-18 MED ORDER — GLUCOSE 40 % PO GEL
15.0000 g | ORAL | Status: DC | PRN
Start: 2012-06-18 — End: 2012-06-25

## 2012-06-18 MED ORDER — LISINOPRIL 5 MG PO TABS
10.00 mg | ORAL_TABLET | Freq: Every day | ORAL | Status: DC
Start: 2012-06-18 — End: 2012-06-19
  Administered 2012-06-18 – 2012-06-19 (×2): 10 mg via ORAL
  Filled 2012-06-18 (×2): qty 2

## 2012-06-18 MED ORDER — ACETAMINOPHEN 325 MG PO TABS
650.0000 mg | ORAL_TABLET | ORAL | Status: DC | PRN
Start: 2012-06-18 — End: 2012-06-25
  Administered 2012-06-24: 650 mg via ORAL
  Filled 2012-06-18 (×2): qty 2

## 2012-06-18 MED ORDER — DEXTROSE 50 % IV SOLN
25.0000 mL | INTRAVENOUS | Status: DC | PRN
Start: 2012-06-18 — End: 2012-06-25

## 2012-06-18 MED ORDER — INSULIN GLARGINE 100 UNIT/ML SC SOLN
10.0000 [IU] | Freq: Once | SUBCUTANEOUS | Status: AC
Start: 2012-06-18 — End: 2012-06-18
  Administered 2012-06-18: 10 [IU] via SUBCUTANEOUS
  Filled 2012-06-18: qty 100

## 2012-06-18 NOTE — Progress Notes (Signed)
Pt transferred via bed/monitor from tele, no c/o pain or distress, vs stable

## 2012-06-18 NOTE — Progress Notes (Signed)
3 units novolog insulin given for blood sugar 260. verified by Denman George.

## 2012-06-18 NOTE — ED Provider Notes (Signed)
Physician/Midlevel provider first contact with patient: 06/17/12 1558         History   No chief complaint on file.    HPI Comments: 37 y.o. With h/o gastroparesis and gastritis who has had epigastric pain since yesterday. ALso with n/v. No constipation. He ran out of his gastritis meds last week but says he has been taking his dm meds. No fevers/chills. Nothing improves or worsens.    Patient is a 37 y.o. male presenting with abdominal pain. The history is provided by the patient.   Abdominal Pain  The primary symptoms of the illness include abdominal pain, nausea and vomiting. The primary symptoms of the illness do not include fever, shortness of breath, diarrhea or dysuria. The current episode started yesterday. The onset of the illness was gradual. The problem has been gradually worsening.   Additional symptoms associated with the illness include diaphoresis. Symptoms associated with the illness do not include chills, frequency or back pain.       Past Medical History   Diagnosis Date   . Diabetes    . Hypertensive disorder        Past Surgical History   Procedure Date   . Cholecystectomy        No family history on file.    Social  History   Substance Use Topics   . Smoking status: Never Smoker    . Smokeless tobacco: Never Used   . Alcohol Use: No       .     No Known Allergies    Current Discharge Medication List      CONTINUE these medications which have NOT CHANGED    Details   !! lisinopril (PRINIVIL,ZESTRIL) 20 MG tablet Take 20 mg by mouth daily.      pantoprazole (PROTONIX) 40 MG tablet Take 40 mg by mouth daily.       !! - Potential duplicate medications found. Please discuss with provider.           Review of Systems   Constitutional: Positive for diaphoresis. Negative for fever and chills.   HENT: Negative for congestion, sore throat and rhinorrhea.    Respiratory: Negative for cough, chest tightness and shortness of breath.    Cardiovascular: Negative for chest pain and palpitations.    Gastrointestinal: Positive for nausea, vomiting and abdominal pain. Negative for diarrhea.   Genitourinary: Negative for dysuria and frequency.   Musculoskeletal: Negative for myalgias and back pain.   Skin: Negative for color change and rash.   Neurological: Negative for dizziness and headaches.   Psychiatric/Behavioral: Negative for confusion. The patient is not nervous/anxious.        Physical Exam    BP 156/82  Pulse 89  Temp 98.2 F (36.8 C) (Temporal Artery)  Resp 13  Ht 1.905 m  Wt 127.1 kg  BMI 35.02 kg/m2  SpO2 96%    Physical Exam   Nursing note and vitals reviewed.  Constitutional: He is oriented to person, place, and time. He appears well-developed and well-nourished. He appears distressed.   HENT:   Head: Normocephalic and atraumatic.   Eyes: Conjunctivae normal are normal. Pupils are equal, round, and reactive to light.   Neck: Normal range of motion. Neck supple.   Cardiovascular: Regular rhythm and normal heart sounds.         tachycardic   Pulmonary/Chest: Effort normal and breath sounds normal. No respiratory distress. He has no wheezes. He has no rales.   Abdominal: Soft. He exhibits  no distension. There is tenderness (mild epigastric tenderness). There is no rebound and no guarding.   Musculoskeletal: Normal range of motion. He exhibits no edema and no tenderness.   Neurological: He is alert and oriented to person, place, and time. No cranial nerve deficit.   Skin: Skin is warm. He is diaphoretic.   Psychiatric: He has a normal mood and affect. His behavior is normal. Judgment and thought content normal.       MDM and ED Course     ED Medication Orders      Start     Status Ordering Provider    06/17/12 1825   pantoprazole (PROTONIX) injection 40 mg   Once      Route: Intravenous  Ordered Dose: 40 mg         Last MAR action:  Given Niomie Englert H    06/17/12 1731      Continuous,   Status:  Discontinued      Route: Intravenous  Ordered Dose: 5 Units/hr         Discontinued Zae Kirtz H     06/17/12 1730   sodium chloride 0.9 % bolus 2,000 mL   Once      Route: Intravenous  Ordered Dose: 2,000 mL         Last MAR action:  [employer], Kaylee Wombles H    06/17/12 1730   insulin regular (HumuLIN,NovoLIN) injection 8 Units   Once      Route: Intravenous  Ordered Dose: 8 Units         Last MAR action:  Given Chozen Latulippe H    06/17/12 1605   promethazine (PHENERGAN) injection 12.5 mg   Once      Route: Intravenous  Ordered Dose: 12.5 mg         Last MAR action:  Given Eliud Polo H    06/17/12 1605   HYDROmorphone (DILAUDID) injection 1 mg   Once      Route: Intravenous  Ordered Dose: 1 mg         Last MAR action:  Given Meher Kucinski H                 MDM  Number of Diagnoses or Management Options  Acute renal failure:   Dehydration, severe:   DKA (diabetic ketoacidoses):   Nausea and vomiting:   Diagnosis management comments: Ddx: gastroparesis, DKA, no sign of obstruction  Plan: labs, ivf, obs    I, [name], M.D, have been the primary provider for Osborne Oman during this Emergency Dept visit  Oxygen saturation by pulse oximetry is 95%-100%, Normal.  Interventions: Patient Observed.      Pt with DKA. Will admit for insulin gtt, ivf, obs.       Amount and/or Complexity of Data Reviewed  Clinical lab tests: reviewed and ordered  Tests in the radiology section of CPT: reviewed and ordered  Discuss the patient with other providers: yes (Dr. Nedra Hai- will admit)    Risk of Complications, Morbidity, and/or Mortality  Presenting problems: high  Diagnostic procedures: high  Management options: high    Critical Care  Total time providing critical care: 30-74 minutes    Patient Progress  Patient progress: improved        Procedures    Clinical Impression & Disposition     Clinical Impression  Final diagnoses:   DKA (diabetic ketoacidoses)   Acute renal failure   Dehydration, severe   Nausea and vomiting  Dehydration, moderate   GERD (gastroesophageal reflux disease)   Ileus   Diabetic gastroparesis        ED Disposition     Admit  Bed Type: ICU [10]  Admitting Physician: Lawernce Ion [[id number]]  Patient Class: Inpatient [101]             Current Discharge Medication List      START taking these medications    Details   amLODIPine (NORVASC) 5 MG tablet Take 1 tablet (5 mg total) by mouth daily.  Qty: 30 tablet, Refills: 0      insulin aspart (NOVOLOG) 100 UNIT/ML injection Inject 1-8 Units into the skin 4 times daily - with meals and at bedtime.  Qty: 10 mL, Refills: 0                     Leticia Clas, MD  06/20/12 2208

## 2012-06-18 NOTE — Progress Notes (Signed)
[facility] Case Management Initial Discharge Planning Assessment     Psychosocial/Demographic information   Who was interviewed, relationship, best contact information - Patient  Cognitive status: A&O X 3  Pt lives with: with his fiance   Type of residence: Apartment on the 3rd floor, 30 steps to go up  Prior level of functioning: Independent with his ADLs, works full time, able to Tree surgeon and Support system: family members/friends  Insurance status, co-pays, medication coverage:[employer]  Any additional emergency contacts? Neffy Gwyneth Revels, [phone] cell     DME, SNF, Home Care companies   DME currently at home: none  Has the patient been to a SNF in the past? If so, where?: no  Any home care companies - no  Palliative care or hospice involvement - no      Advanced directives on the chart? no  Healthcare Decision Maker and relationship to patient -   Neffy Gwyneth Revels, [phone] cell    PCP - Dr. Burt Knack  TCM referral needed? No, pt has commercial insurance  D/C Clinic appointment needed?  Date, time and Location- No  CM offered PCP follow up appointment setup within 72 hrs of discharge. Pt prefers to make his own appointment  Appointment Date and time-       Discharge Needs: No specific needs     Potential Barriers to Discharge: Medical condition     Discussed Anticipated Discharge Date and Discharge Disposition Possibilities with: Patient    Pt goals/preferences:    Anticipation of care needs increasing or decreasing over time?      Pt has history of diabetes and he told the Case Manager that he is very compliant.  He has DM supplies, including glucometer and insulin, at home.  No other needs were identified at this time and CM will be available as needed.

## 2012-06-18 NOTE — Progress Notes (Signed)
[facility]- Critical Care Note     ICU Daily Progress Note        Date Time: 06/18/2012 2:21 PM  Patient Name: [name]  Attending Physician: Lawernce Ion, MD  Room: IC04/IC04-A   Admit Date: 06/17/2012  LOS: 1 day            Assessment:     Patient Active Problem List   Diagnosis   . DKA (diabetic ketoacidoses)   . GERD (gastroesophageal reflux disease)   . Gastroparesis   . Dehydration, moderate   . Gastritis without bleeding   . AKI (acute kidney injury)   DKA resolved.  Kidney function normalized.      Plan:   SQ lantus and novolog  Transfer medicine.  restart low dose lisinopril.        Subjective:   nad    Medications:       Scheduled Meds: PRN Meds:         heparin (porcine) 5,000 Units Subcutaneous Q8H SCH   [COMPLETED] HYDROmorphone 1 mg Intravenous Once   insulin aspart 1-8 Units Subcutaneous TID AC & HS   [COMPLETED] insulin glargine 10 Units Subcutaneous Once   [COMPLETED] insulin regular 8 Units Intravenous Once   [COMPLETED] magnesium sulfate 1 g Intravenous Once   metoclopramide 10 mg Intravenous Q6H SCH   [COMPLETED] pantoprazole 40 mg Intravenous Once   [COMPLETED] potassium chloride SA 20 mEq Oral Once   [COMPLETED] promethazine 12.5 mg Intravenous Once   [COMPLETED] sodium chloride 1,000 mL Intravenous Q1H   [COMPLETED] sodium chloride 2,000 mL Intravenous Once   sucralfate 1 g Oral TID AC & HS       Continuous Infusions:       . [EXPIRED] insulin (regular) infusion 2 Units/hr (06/18/12 0900)   . [DISCONTINUED] insulin (regular) infusion 2 Units/hr (06/17/12 1915)         [EXPIRED] dextrose 5 % and 0.9% NaCl 150 mL/hr PRN   dextrose 15 g PRN   dextrose 25 mL PRN   glucagon (rDNA) 1 mg PRN   hydrALAZINE 10 mg Q6H PRN   HYDROmorphone 0.2 mg Q6H PRN   ondansetron 4 mg Q6H PRN   [DISCONTINUED] dextrose 25 mL PRN   [DISCONTINUED] dextrose 50 mL PRN             Physical Exam:     Filed Vitals:    06/18/12 1000 06/18/12 1100 06/18/12 1200 06/18/12 1300   BP: 113/63 120/74 125/73 137/84    Pulse: 88 87 90 93   Temp:   97.5 F (36.4 C)    TempSrc:   Temporal Artery    Resp: 16 16 22 19    Height:       Weight:       SpO2: 97% 98% 99% 99%     Temp (24hrs), Avg:98 F (36.7 C), Min:97.5 F (36.4 C), Max:98.4 F (36.9 C)           05/06 0701 - 05/07 0700  In: 4340 [P.O.:100; I.V.:4240]  Out: 700 [Urine:700]       General Appearance: comfortable  Mental status: alert, oriented  Neuro:  nonfocal  H & N: no lesions  Lungs: clear  Cardiac: reg  Abdomen: NT   Extremities: no edema  Skin: no rash      Data:         Labs:     Recent CBC   Lab 06/18/12 0633 06/18/12 0310 06/17/12 1629   WBC 8.19 9.38  10.78   RBC 4.91 4.87 6.46*   HGB 13.0 13.0 17.4*   HCT 39.3* 38.9* 51.3   MCV 80.0 79.9* 79.4*   PLT 203 216 223         Lab 06/18/12 0633 06/17/12 1629   NA 141 137   K 3.7 5.3*   CL 110* 100   CO2 25 17*   GLU 176* 396*   BUN 12.6 16.4   CREAT 1.2 1.8*   MG 1.8 --   PHOS -- --   AST -- 31   ALT -- 36   ALKPHOS -- 122   BILITOTAL -- 0.9   BILIDIRECT -- --   LIP -- 41   BNP -- --   PT -- --   INR -- --   PTT -- --   DDIMER -- --   CK -- --   CKMB -- --   TROPI -- --   MYOGLOBIN -- --           Rads:       Radiological Imaging personally reviewed.    I have personally reviewed the patient's history and 24 hour interval events, along with vitals, labs, radiology images and  ventilator settings and additional findings found in detail within ICU team notes, with their care plans developed with and reviewed by me.         Signed by: Durward Fortes, MD  Date/Time: 06/18/2012 2:21 PM

## 2012-06-18 NOTE — Plan of Care (Signed)
Pt progressing with POC. No distress noted. Assessment complete. Pt denies pain. Will monitor

## 2012-06-19 DIAGNOSIS — K219 Gastro-esophageal reflux disease without esophagitis: Secondary | ICD-10-CM

## 2012-06-19 DIAGNOSIS — E1110 Type 2 diabetes mellitus with ketoacidosis without coma: Secondary | ICD-10-CM

## 2012-06-19 DIAGNOSIS — K560 Paralytic ileus: Secondary | ICD-10-CM

## 2012-06-19 DIAGNOSIS — R112 Nausea with vomiting, unspecified: Secondary | ICD-10-CM

## 2012-06-19 DIAGNOSIS — E860 Dehydration: Secondary | ICD-10-CM

## 2012-06-19 LAB — GFR: EGFR: >60

## 2012-06-19 LAB — BASIC METABOLIC PANEL
Anion Gap: 8 (ref 5.0–15.0)
BUN: 10.7 mg/dL (ref 9.0–21.0)
CO2: 24 (ref 22–29)
Calcium: 8.6 mg/dL (ref 8.5–10.5)
Chloride: 108 — ABNORMAL HIGH (ref 98–107)
Creatinine: 1.1 mg/dL (ref 0.7–1.3)
Glucose: 147 mg/dL — ABNORMAL HIGH (ref 70–100)
Potassium: 3.7 (ref 3.5–5.1)
Sodium: 140 (ref 136–145)

## 2012-06-19 LAB — MAGNESIUM: Magnesium: 1.6 mg/dL (ref 1.6–2.6)

## 2012-06-19 LAB — CBC
Hematocrit: 41.2 % — ABNORMAL LOW (ref 42.0–52.0)
Hgb: 13.6 g/dL (ref 13.0–17.0)
MCH: 26.3 pg — ABNORMAL LOW (ref 28.0–32.0)
MCHC: 33 g/dL (ref 32.0–36.0)
MCV: 79.5 fL — ABNORMAL LOW (ref 80.0–100.0)
MPV: 10 fL (ref 9.4–12.3)
Platelets: 205 (ref 140–400)
RBC: 5.18 (ref 4.70–6.00)
RDW: 12 % (ref 12–15)
WBC: 5.41 (ref 3.50–10.80)

## 2012-06-19 LAB — POCT GLUCOSE
Whole Blood Glucose POCT: 155 mg/dL — AB (ref 70–100)
Whole Blood Glucose POCT: 210 mg/dL — AB (ref 70–100)
Whole Blood Glucose POCT: 205 mg/dL — AB (ref 70–100)
Whole Blood Glucose POCT: 172 mg/dL — AB (ref 70–100)

## 2012-06-19 LAB — PHOSPHORUS: Phosphorus: 3.4 mg/dL (ref 2.3–4.7)

## 2012-06-19 MED ORDER — PROMETHAZINE HCL 25 MG/ML IJ SOLN
12.5000 mg | Freq: Four times a day (QID) | INTRAMUSCULAR | Status: DC | PRN
Start: 2012-06-19 — End: 2012-06-20
  Administered 2012-06-19: 12.5 mg via INTRAVENOUS

## 2012-06-19 MED ORDER — AMLODIPINE BESYLATE 5 MG PO TABS
5.0000 mg | ORAL_TABLET | Freq: Every day | ORAL | Status: DC
Start: 2012-06-19 — End: 2012-06-21
  Administered 2012-06-19 – 2012-06-20 (×2): 5 mg via ORAL
  Filled 2012-06-19 (×2): qty 1

## 2012-06-19 MED ORDER — LISINOPRIL 20 MG PO TABS
20.0000 mg | ORAL_TABLET | Freq: Every day | ORAL | Status: DC
Start: 2012-06-20 — End: 2012-06-23
  Administered 2012-06-20 – 2012-06-22 (×3): 20 mg via ORAL
  Filled 2012-06-19 (×4): qty 1

## 2012-06-19 MED ORDER — SODIUM CHLORIDE 0.9 % IV SOLN
INTRAVENOUS | Status: DC
Start: 2012-06-19 — End: 2012-06-25
  Administered 2012-06-22: 125 mL/h via INTRAVENOUS

## 2012-06-19 MED ORDER — INSULIN GLARGINE 100 UNIT/ML SC SOLN
30.00 [IU] | Freq: Every evening | SUBCUTANEOUS | Status: DC
Start: 2012-06-19 — End: 2012-06-25

## 2012-06-19 MED ORDER — INSULIN ASPART 100 UNIT/ML SC SOLN
1.0000 [IU] | Freq: Four times a day (QID) | SUBCUTANEOUS | Status: DC
Start: 2012-06-19 — End: 2012-06-25

## 2012-06-19 MED ORDER — METOCLOPRAMIDE HCL 5 MG/ML IJ SOLN
10.0000 mg | Freq: Four times a day (QID) | INTRAMUSCULAR | Status: DC | PRN
Start: 2012-06-19 — End: 2012-06-20
  Administered 2012-06-19 – 2012-06-20 (×2): 10 mg via INTRAVENOUS
  Filled 2012-06-19 (×2): qty 2

## 2012-06-19 MED ORDER — MORPHINE SULFATE 2 MG/ML IJ/IV SOLN (WRAP)
2.0000 mg | Status: DC | PRN
Start: 2012-06-19 — End: 2012-06-21
  Administered 2012-06-19 – 2012-06-21 (×9): 2 mg via INTRAVENOUS
  Filled 2012-06-19 (×9): qty 1

## 2012-06-19 MED ORDER — PROMETHAZINE HCL 25 MG/ML IJ SOLN
12.50 mg | Freq: Four times a day (QID) | INTRAMUSCULAR | Status: DC | PRN
Start: 2012-06-19 — End: 2012-06-19
  Administered 2012-06-19: 12.5 mg via INTRAMUSCULAR
  Filled 2012-06-19 (×2): qty 1

## 2012-06-19 NOTE — Progress Notes (Addendum)
INTERNAL MEDICINE HOSPITALIST PROGRESS NOTE      06/19/2012   Osborne Oman [id number]:[id number]        SUBJECTIVE     Osborne Oman today:+vomiting, no diarrhea, abdominal pain after vomiting  All other organ systems are reviewed and unremarkable.    OBJECTIVE:     Blood pressure 168/94, pulse 89, temperature 96.1 F (35.6 C), temperature source Temporal Artery, resp. rate 18, height 1.905 m (6\' 3" ), weight 127.1 kg (280 lb 3.3 oz), SpO2 97.00%.    General Appearance: Anxious due to vomiting and in no acute respiratory distress.    HEENT: AT/ NC, oral mucosa is moist with no lesions.  Pupils are equal, round, and reactive to light.   Lungs:  Normal respiratory rate and normal effort.    Breath sounds clear to auscultation in all lung fields.  No wheezes, rales, rhonchi or decreased breath sounds.    Heart: Normal rate.  S1 normal and S2 normal. No Murmurs, Rubs or gallops audible  Chest: Symmetric chest wall expansion.  Abdomen: Abdomen is soft, scaphoid and non-distended.  There are no signs of ascites. Bowel sounds are normal.  There is no abdominal tenderness in all four quadrants and epigastrium.  There is no palpable mass.   Neurological: Patient is alert and oriented to person, place and time.  Normal motor strength in all extremities.    Extremities: No pretibial edema, dorsalis pedis pulses palpable  Skin:  Warm and dry.  No rash or ecchymosis.    DIAGNOSTIC LAB STUDY     Recent Labs   [facility] 06/19/12 0630 06/18/12 0633    WBC 5.41 8.19    HGB 13.6 13.0    HCT 41.2* 39.3*    PLT 205 203    MCV 79.5* 80.0     Recent Labs   Basename 06/19/12 0630 06/18/12 1713 06/18/12 0633    NA 140 139 --    K 3.7 4.1 --    CL 108* 107 --    CO2 24 25 --    BUN 10.7 11.4 --    CREAT 1.1 1.3 --    GLU 147* 244* --    CA 8.6 8.7 --    MG 1.6 -- 1.8    PHOS 3.4 -- --     Recent Labs   Basename 06/17/12 1629    AST 31    ALT 36    ALKPHOS 122    PROT 8.8*    ALB 4.6       DIAGNOSTIC IMAGING STUDIES   Radiological  Procedure reviewed.  Radiology Results (24 Hour)     ** No Results found for the last 24 hours. **          CURRENT MEDICATIONS     Current Facility-Administered Medications   Medication Dose Route Frequency   . famotidine  20 mg Oral Q12H SCH   . heparin (porcine)  5,000 Units Subcutaneous Q8H SCH   . insulin aspart  1-8 Units Subcutaneous TID AC & HS   . insulin glargine  30 Units Subcutaneous QHS   . lisinopril  10 mg Oral Daily   . sucralfate  1 g Oral TID AC & HS   . [DISCONTINUED] metoclopramide  10 mg Intravenous Q6H SCH     IMPRESSIONS/ PLAN:     1. Nausea/Vomiting- possible ileus  -Change to NPO status  -IV Zofran prn  -Add phenergan prn  -IV fluid    2. Diabetes Mellitus/ s/p DKA  -c/w  Lantus and SSI  -FS ac and qhs    3. Abdominal pain post vomiting  -PPI  -Morphine IV prn severe pain      -CODE STATUS: Full Code         Judson Roch, M.D.  06/19/2012  1:56 PM

## 2012-06-19 NOTE — Progress Notes (Signed)
Assessment done  earlier, Pt denies pain or acute distress at this time.  IV running as ordered, Pt resting in bed, call bell within reach. Will continue to monitor.

## 2012-06-20 DIAGNOSIS — K3184 Gastroparesis: Secondary | ICD-10-CM

## 2012-06-20 DIAGNOSIS — E1149 Type 2 diabetes mellitus with other diabetic neurological complication: Secondary | ICD-10-CM

## 2012-06-20 LAB — BASIC METABOLIC PANEL
Anion Gap: 9 (ref 5.0–15.0)
BUN: 12.8 mg/dL (ref 9.0–21.0)
CO2: 24 (ref 22–29)
Calcium: 8.5 mg/dL (ref 8.5–10.5)
Chloride: 106 (ref 98–107)
Creatinine: 1.2 mg/dL (ref 0.7–1.3)
Glucose: 168 mg/dL — ABNORMAL HIGH (ref 70–100)
Potassium: 3.9 (ref 3.5–5.1)
Sodium: 139 (ref 136–145)

## 2012-06-20 LAB — CBC
Hematocrit: 39.7 % — ABNORMAL LOW (ref 42.0–52.0)
Hgb: 13.5 g/dL (ref 13.0–17.0)
MCH: 26.5 pg — ABNORMAL LOW (ref 28.0–32.0)
MCHC: 34 g/dL (ref 32.0–36.0)
MCV: 78 fL — ABNORMAL LOW (ref 80.0–100.0)
MPV: 9.7 fL (ref 9.4–12.3)
Platelets: 217 (ref 140–400)
RBC: 5.09 (ref 4.70–6.00)
RDW: 12 % (ref 12–15)
WBC: 9.05 (ref 3.50–10.80)

## 2012-06-20 LAB — POCT GLUCOSE
Whole Blood Glucose POCT: 149 mg/dL — AB (ref 70–100)
Whole Blood Glucose POCT: 143 mg/dL — AB (ref 70–100)
Whole Blood Glucose POCT: 215 mg/dL — AB (ref 70–100)
Whole Blood Glucose POCT: 175 mg/dL — AB (ref 70–100)

## 2012-06-20 LAB — PHOSPHORUS: Phosphorus: 3.7 mg/dL (ref 2.3–4.7)

## 2012-06-20 LAB — MAGNESIUM: Magnesium: 1.7 mg/dL (ref 1.6–2.6)

## 2012-06-20 LAB — GFR: EGFR: >60

## 2012-06-20 MED ORDER — AMLODIPINE BESYLATE 5 MG PO TABS
5.0000 mg | ORAL_TABLET | Freq: Every day | ORAL | Status: DC
Start: 2012-06-20 — End: 2012-06-25

## 2012-06-20 MED ORDER — METOCLOPRAMIDE HCL 5 MG/ML IJ SOLN
10.00 mg | Freq: Four times a day (QID) | INTRAMUSCULAR | Status: DC
Start: 2012-06-20 — End: 2012-06-25
  Administered 2012-06-20 – 2012-06-25 (×19): 10 mg via INTRAVENOUS
  Filled 2012-06-20 (×13): qty 2
  Filled 2012-06-20: qty 4
  Filled 2012-06-20 (×4): qty 2

## 2012-06-20 MED ORDER — LISINOPRIL 20 MG PO TABS
20.0000 mg | ORAL_TABLET | Freq: Every day | ORAL | Status: DC
Start: 2012-06-20 — End: 2012-06-25

## 2012-06-20 MED ORDER — ONDANSETRON HCL 4 MG/2ML IJ SOLN
4.0000 mg | INTRAMUSCULAR | Status: DC | PRN
Start: 2012-06-20 — End: 2012-06-25
  Administered 2012-06-20 – 2012-06-24 (×13): 4 mg via INTRAVENOUS
  Filled 2012-06-20 (×13): qty 2

## 2012-06-20 MED ORDER — CHLORPROMAZINE HCL 25 MG PO TABS
25.00 mg | ORAL_TABLET | Freq: Three times a day (TID) | ORAL | Status: DC | PRN
Start: 2012-06-20 — End: 2012-06-25
  Administered 2012-06-21: 25 mg via ORAL
  Filled 2012-06-20: qty 1

## 2012-06-20 MED ORDER — PROMETHAZINE HCL 25 MG/ML IJ SOLN
12.50 mg | Freq: Four times a day (QID) | INTRAMUSCULAR | Status: DC | PRN
Start: 2012-06-20 — End: 2012-06-25
  Administered 2012-06-21 – 2012-06-24 (×8): 12.5 mg via INTRAVENOUS
  Filled 2012-06-20 (×11): qty 1

## 2012-06-20 NOTE — Progress Notes (Signed)
Pt already on zofran q4hrs as needed, but still vomiting, Dr Ricka Burdock was made aware, with new orders noted and carried out,

## 2012-06-20 NOTE — Discharge Summary (Signed)
Discharge Date:      ATTENDING PHYSICIAN:  Judson Roch, MD     DISCHARGE DIAGNOSES:  1.  Diabetes mellitus type 2, status post diabetic ketoacidosis.  2.  Nausea, vomiting with possible ileus.  3.  Abdominal pain due to nausea and vomiting, resolved.     CONSULTATIONS OBTAINED:  Dr. Leonia Reader Mendiguren, Dr. Thornton Papas, critical care.     HISTORY AND HOSPITAL COURSE:  Mr. [name] is a 37 year old man with a past medical history of  diabetes mellitus type 2 with a history of DKA in the past and  gastroparesis and gastritis and GERD, who presented to the hospital with  abdominal pain and intractable vomiting.  The patient was diagnosed with  DKA and started on IV hydration, IV fluids, and insulin drip, and the  patient was admitted in intensive care unit with continued IV insulin as  well as fluid hydration.  The patient's anion gap was closed, and the  patient's glucose was well controlled, and subsequently insulin drip was  transitioned to Lantus and sliding scale insulin, and subsequently the  patient's blood glucose level was well controlled; however, the patient had  multiple episodes of nausea and vomiting, for which IV antiemetics were  continued as needed.  Subsequently, the patient was kept n.p.o.   Subsequently, the patient's nausea and vomiting resolved, and the patient  is started on a clear liquid diet, which the patient is tolerating well  currently with no nausea, vomiting, no diarrhea, no abdominal pain, and the  patient's blood glucose level is well controlled, and the patient is ready  for discharge, and I have counseled the patient for medication  noncompliance, and I stressed the importance of continuing with the insulin  regimen to prevent future hospitalizations due to diabetic ketoacidosis as  well as long-term complications from uncontrolled diabetes mellitus, such  as MI, stroke, diabetic foot ulcers requiring amputations, and the patient  comprehended my information and verbalized back to  me that he will be  compliant with his medications and will follow up with the primary care  physician in [place], [place].     CONDITION UPON DISCHARGE:  Hemodynamically stable, afebrile, and no nausea, vomiting.     DISCHARGE MEDICATIONS:  Please see medical reconciliation form for discharge medications.     FOLLOWUP INSTRUCTIONS:  Follow up with your primary care physician, Dr. Alben Spittle, within 1 week.           D:  06/20/2012 13:59 PM by Dr. Judson Roch, MD ([id number])  T:  06/20/2012 20:54 PM by [id number]      Everlean Cherry: [id number]) (Doc ID: [id number])

## 2012-06-20 NOTE — Progress Notes (Signed)
Assessment done this AM. Pt denies pain or acute distress. IV running as ordered, Pt resting in bed, call bell within reach. Will continue to monitor.

## 2012-06-20 NOTE — Plan of Care (Signed)
Patient could not tolerate clear liquid diet and likely has advanced diabetic gastroparesis. Start Reglan IV scheduled doses and IV fluid, cancel discharge to home today, only clear liquid diet.

## 2012-06-21 ENCOUNTER — Inpatient Hospital Stay: Payer: BC Managed Care – PPO

## 2012-06-21 LAB — POCT GLUCOSE
Whole Blood Glucose POCT: 131 mg/dL — AB (ref 70–100)
Whole Blood Glucose POCT: 176 mg/dL — AB (ref 70–100)
Whole Blood Glucose POCT: 210 mg/dL — AB (ref 70–100)
Whole Blood Glucose POCT: 171 mg/dL — AB (ref 70–100)

## 2012-06-21 LAB — PHOSPHORUS: Phosphorus: 2.5 mg/dL (ref 2.3–4.7)

## 2012-06-21 MED ORDER — AMLODIPINE BESYLATE 5 MG PO TABS
10.0000 mg | ORAL_TABLET | Freq: Every day | ORAL | Status: DC
Start: 2012-06-21 — End: 2012-06-25
  Administered 2012-06-21 – 2012-06-25 (×5): 10 mg via ORAL
  Filled 2012-06-21 (×5): qty 2

## 2012-06-21 MED ORDER — FAMOTIDINE 10 MG/ML IV SOLN (WRAP)
20.00 mg | Freq: Two times a day (BID) | INTRAVENOUS | Status: DC
Start: 2012-06-21 — End: 2012-06-23
  Administered 2012-06-21 – 2012-06-23 (×5): 20 mg via INTRAVENOUS
  Filled 2012-06-21 (×5): qty 2

## 2012-06-21 MED ORDER — HYDRALAZINE HCL 20 MG/ML IJ SOLN
10.00 mg | Freq: Four times a day (QID) | INTRAMUSCULAR | Status: DC | PRN
Start: 2012-06-21 — End: 2012-06-22
  Administered 2012-06-21 – 2012-06-22 (×4): 10 mg via INTRAVENOUS
  Filled 2012-06-21 (×4): qty 1

## 2012-06-21 MED ORDER — SCOPOLAMINE 1 MG/3DAYS TD PT72
1.0000 | MEDICATED_PATCH | TRANSDERMAL | Status: DC
Start: 2012-06-21 — End: 2012-06-24
  Administered 2012-06-21: 1 via TRANSDERMAL
  Filled 2012-06-21 (×2): qty 1

## 2012-06-21 MED ORDER — HYDROMORPHONE HCL PF 1 MG/ML IJ SOLN
1.00 mg | Freq: Once | INTRAMUSCULAR | Status: AC
Start: 2012-06-22 — End: 2012-06-22
  Administered 2012-06-22: 1 mg via INTRAVENOUS
  Filled 2012-06-21: qty 1

## 2012-06-21 MED ORDER — MORPHINE SULFATE 2 MG/ML IJ/IV SOLN (WRAP)
2.0000 mg | Freq: Once | Status: DC
Start: 2012-06-21 — End: 2012-06-22
  Filled 2012-06-21: qty 1

## 2012-06-21 MED ORDER — INSULIN GLARGINE 100 UNIT/ML SC SOLN
20.00 [IU] | Freq: Every evening | SUBCUTANEOUS | Status: DC
Start: 2012-06-21 — End: 2012-06-23
  Administered 2012-06-21 – 2012-06-22 (×2): 20 [IU] via SUBCUTANEOUS
  Filled 2012-06-21 (×2): qty 200

## 2012-06-21 MED ORDER — MORPHINE SULFATE 2 MG/ML IJ/IV SOLN (WRAP)
1.0000 mg | Status: DC | PRN
Start: 2012-06-21 — End: 2012-06-21
  Administered 2012-06-21: 1 mg via INTRAVENOUS
  Filled 2012-06-21: qty 1

## 2012-06-21 NOTE — Progress Notes (Signed)
Pt was complaining palpitation. Denies chest pain, no sob. Still complaining abdominal pain 8/10. Rechecked v/s bp 164/89 hr 116 rr 16. Pt still vomiting prn phenergan given. Informed md with new order x 1 dose of morphine 2mg  iv.

## 2012-06-21 NOTE — Plan of Care (Signed)
Problem: Health Promotion  Goal: Knowledge - disease process  Extent of understanding conveyed about a specific disease process.   Outcome: Progressing  Updated re plan of care    Problem: Safety  Goal: Patient will be free from injury during hospitalization  Outcome: Progressing  freq checks done, offered assistance as needed, call light w/in reach    Problem: Pain  Goal: Patient's pain/discomfort is manageable  Outcome: Progressing  Prn pain meds with help noted, offered pain meds as needed, will cont to monitor, manage and control pain    Problem: Potential for Compromised Skin Integrity  Goal: Nutritional status is improving  Monitor and assess patient for malnutrition (ex- brittle hair, bruises, dry skin, pale skin and conjunctiva, muscle wasting, smooth red tongue, and disorientation). Collaborate with interdisciplinary team and initiate plan and interventions as ordered. Monitor patient's weight and dietary intake as ordered or per policy. Utilize nutrition screening tool and intervene per policy. Determine patient's food preferences and provide high-protein, high-caloric foods as appropriate.   Outcome: Not Progressing  Remains nauseous, cont on meds for nausea as ordered. Will cont to monitor    Comments:   Cont on ov fluids as ordered, cont plan of care, adv diet as tol, cont nausea meds, cont to monitor

## 2012-06-21 NOTE — Progress Notes (Signed)
Pt transported to  X.ray via bed, will resume care upon return

## 2012-06-21 NOTE — Progress Notes (Signed)
Assessment done, Pt continue to c/o nausea/Vomiting, Medicated by  night shift RN. IV running as ordered, Pt resting in bed, call bell within reach. Will continue to monitor.

## 2012-06-21 NOTE — Plan of Care (Signed)
Ask3Teach3 Program    Education about New Medications and their Side effects    Dear [name],    Its been a pleasure taking care of you during your hospitalization here at [facility]. We have initiated a new program to educate our patients and/or their family members or designated personell about the new medications started by your physicians and their indications along with the possible side effects. Multiple studies have shown that patients started on new medications are often unaware of the names of the medication along with the indications and their side effects which leads to decreased compliance with the medications.    During our conversation today on 06/21/2012  10:17 AM I have explained to you the name of the new medication started, why it is needed, and some possible side effects. During the teach back I was able to confirm your understanding of the information shared about this new medication.     Medication:  Amlodipine(Norvasc)   This Medication is used for:   High Blood Pressure   Angina     Common Side Effects are:   Headache   Dizziness   Nausea   Swelling     A note from your nurse:  Call your nurse immediately if you notice itching, hives, swelling or trouble breathing       Thank you for your time.    Landry Mellow, RN  06/21/2012  10:17 AM  [facility]  [address]  [place], [place]  [address]

## 2012-06-21 NOTE — Progress Notes (Addendum)
INTERNAL MEDICINE HOSPITALIST PROGRESS NOTE      06/21/2012   [name] [id number]    SUBJECTIVE     [name] today: hiccups, nausea and vomiting, NPO, fatigued.  All other organ systems are reviewed and unremarkable.    OBJECTIVE:     Blood pressure 171/93, pulse 99, temperature 97.6 F (36.4 C), temperature source Temporal Artery, resp. rate 16, height 1.905 m (6\' 3" ), weight 127.1 kg (280 lb 3.3 oz), SpO2 96.00%.    General Appearance: Anxious due to hiccups and nausea and in no acute respiratory distress.    HEENT: AT/ NC, oral mucosa is moist with no lesions.  Pupils are equal, round, and reactive to light.   Lungs:  Normal respiratory rate and normal effort.    Breath sounds clear to auscultation in all lung fields.  No wheezes, rales, rhonchi or decreased breath sounds.    Heart: Normal rate.  S1 normal and S2 normal. No Murmurs, Rubs or gallops audible  Chest: Symmetric chest wall expansion.  Abdomen: Abdomen is soft, scaphoid and non-distended.  There are no signs of ascites. Bowel sounds are normal.  There is no abdominal tenderness in all four quadrants and epigastrium.  There is no palpable mass.   Neurological: Patient is alert and oriented to person, place and time.  Normal motor strength in all extremities.    Extremities: No pretibial edema, dorsalis pedis pulses palpable  Skin:  Warm and dry.  No rash or ecchymosis.    DIAGNOSTIC LAB STUDY     Recent Labs   [facility] 06/20/12 0739 06/19/12 0630    WBC 9.05 5.41    HGB 13.5 13.6    HCT 39.7* 41.2*    PLT 217 205    MCV 78.0* 79.5*     Recent Labs   Basename 06/21/12 0552 06/20/12 0739 06/19/12 0630    NA -- 139 140    K -- 3.9 3.7    CL -- 106 108*    CO2 -- 24 24    BUN -- 12.8 10.7    CREAT -- 1.2 1.1    GLU -- 168* 147*    CA -- 8.5 8.6    MG -- 1.7 1.6    PHOS 2.5 3.7 --     No results found for this basename: AST:2,ALT:2,ALKPHOS:2,PROT:2,ALB:2 in the last 72 hours    DIAGNOSTIC IMAGING STUDIES   Radiological Procedure  reviewed.  Radiology Results (24 Hour)     ** No Results found for the last 24 hours. **          CURRENT MEDICATIONS     Current Facility-Administered Medications   Medication Dose Route Frequency   . amLODIPine  10 mg Oral Daily   . famotidine  20 mg Oral Q12H SCH   . heparin (porcine)  5,000 Units Subcutaneous Q8H SCH   . insulin aspart  1-8 Units Subcutaneous TID AC & HS   . insulin glargine  30 Units Subcutaneous QHS   . lisinopril  20 mg Oral Daily   . metoclopramide  10 mg Intravenous Q6H SCH   . sucralfate  1 g Oral TID AC & HS   . [DISCONTINUED] amLODIPine  5 mg Oral Daily     IMPRESSIONS/ PLAN:     1. Intractable Nausea/Vomiting/ hiccups  -NPO status  -IV Zofran prn  -IV Reglan q 4 hours  -Abdominal X-Ray today  -IV fluid  -Start Scopolamine patch  -Start Thorazine prn    2. Diabetes  Mellitus/ s/p DKA  -decrease Lantus to 20 units SubQ daily due to NPO status and SSI  -FS q 6 hours    3. Abdominal pain post vomiting  -PPI  -Morphine IV prn severe pain    4. HTN controlled  -Hydralazine IV prn      -CODE STATUS: Full Code         Judson Roch, M.D.  06/21/2012  3:08 PM

## 2012-06-22 ENCOUNTER — Inpatient Hospital Stay: Payer: BC Managed Care – PPO

## 2012-06-22 LAB — CBC AND DIFFERENTIAL
Basophils Absolute Automated: 0.03 (ref 0.00–0.20)
Basophils Automated: 0 %
Eosinophils Absolute Automated: 0.03 (ref 0.00–0.70)
Eosinophils Automated: 0 %
Hematocrit: 41.5 % — ABNORMAL LOW (ref 42.0–52.0)
Hgb: 14.1 g/dL (ref 13.0–17.0)
Immature Granulocytes Absolute: 0.05
Immature Granulocytes: 1 %
Lymphocytes Absolute Automated: 1.98 (ref 0.50–4.40)
Lymphocytes Automated: 24 %
MCH: 26.8 pg — ABNORMAL LOW (ref 28.0–32.0)
MCHC: 34 g/dL (ref 32.0–36.0)
MCV: 78.9 fL — ABNORMAL LOW (ref 80.0–100.0)
MPV: 9.5 fL (ref 9.4–12.3)
Monocytes Absolute Automated: 0.77 (ref 0.00–1.20)
Monocytes: 9 %
Neutrophils Absolute: 5.46 (ref 1.80–8.10)
Neutrophils: 66 %
Platelets: 270 (ref 140–400)
RBC: 5.26 (ref 4.70–6.00)
RDW: 13 % (ref 12–15)
WBC: 8.27 (ref 3.50–10.80)

## 2012-06-22 LAB — BASIC METABOLIC PANEL
Anion Gap: 12 (ref 5.0–15.0)
BUN: 10.9 mg/dL (ref 9.0–21.0)
CO2: 22 (ref 22–29)
Calcium: 8.7 mg/dL (ref 8.5–10.5)
Chloride: 108 — ABNORMAL HIGH (ref 98–107)
Creatinine: 1.2 mg/dL (ref 0.7–1.3)
Glucose: 149 mg/dL — ABNORMAL HIGH (ref 70–100)
Potassium: 3.7 (ref 3.5–5.1)
Sodium: 142 (ref 136–145)

## 2012-06-22 LAB — GFR: EGFR: >60

## 2012-06-22 LAB — POCT GLUCOSE
Whole Blood Glucose POCT: 129 mg/dL — AB (ref 70–100)
Whole Blood Glucose POCT: 142 mg/dL — AB (ref 70–100)
Whole Blood Glucose POCT: 176 mg/dL — AB (ref 70–100)
Whole Blood Glucose POCT: 200 mg/dL — AB (ref 70–100)

## 2012-06-22 MED ORDER — GLUCOSE 40 % PO GEL
15.0000 g | ORAL | Status: DC | PRN
Start: 2012-06-22 — End: 2012-06-22

## 2012-06-22 MED ORDER — HYDROMORPHONE HCL PF 1 MG/ML IJ SOLN
1.0000 mg | INTRAMUSCULAR | Status: DC | PRN
Start: 2012-06-22 — End: 2012-06-23
  Administered 2012-06-22 – 2012-06-23 (×6): 1 mg via INTRAVENOUS
  Filled 2012-06-22 (×6): qty 1

## 2012-06-22 MED ORDER — LABETALOL HCL 5 MG/ML IV SOLN
20.0000 mg | Freq: Four times a day (QID) | INTRAVENOUS | Status: DC | PRN
Start: 2012-06-22 — End: 2012-06-23
  Administered 2012-06-22 – 2012-06-23 (×4): 20 mg via INTRAVENOUS
  Filled 2012-06-22 (×4): qty 4

## 2012-06-22 MED ORDER — DEXTROSE 50 % IV SOLN
25.0000 mL | INTRAVENOUS | Status: DC | PRN
Start: 2012-06-22 — End: 2012-06-22

## 2012-06-22 MED ORDER — INSULIN ASPART 100 UNIT/ML SC SOLN
1.0000 [IU] | Freq: Four times a day (QID) | SUBCUTANEOUS | Status: DC
Start: 2012-06-22 — End: 2012-06-23
  Administered 2012-06-22 – 2012-06-23 (×4): 1 [IU] via SUBCUTANEOUS
  Filled 2012-06-22 (×4): qty 10

## 2012-06-22 MED ORDER — GLUCAGON HCL (RDNA) 1 MG IJ SOLR
1.00 mg | INTRAMUSCULAR | Status: DC | PRN
Start: 2012-06-22 — End: 2012-06-25

## 2012-06-22 MED ORDER — IODIXANOL 320 MG/ML IV SOLN
125.0000 mL | Freq: Once | INTRAVENOUS | Status: AC | PRN
Start: 2012-06-22 — End: 2012-06-22
  Administered 2012-06-22: 125 mL via INTRAVENOUS

## 2012-06-22 NOTE — Progress Notes (Signed)
PATIENT RESTING COMFORTABLY IN BED. NAUSEA AND PERSISTS BUT IS MANAGEABLE FOR PATIENT.

## 2012-06-22 NOTE — Progress Notes (Signed)
Pt resting in bed. No distress noted. Was vomiting. Complaining abdominal pain 7/10. Prn anti nausea and pain med given. Explained common side effects. Verbally understanding. Had 9 episodes of emesis small amount each time during the shift. Call bell within reach. Will continue to monitor,

## 2012-06-22 NOTE — Progress Notes (Signed)
INTERNAL MEDICINE HOSPITALIST PROGRESS NOTE      06/22/2012   [name] [id number]    SUBJECTIVE     [name] today:Persistent nausea and vomiting, fatigued, diffuse abdominal discomfort especially after vomiting  All other organ systems are reviewed and unremarkable.    OBJECTIVE:     Blood pressure 187/101, pulse 102, temperature 97.9 F (36.6 C), temperature source Temporal Artery, resp. rate 16, height 1.905 m (6\' 3" ), weight 127.1 kg (280 lb 3.3 oz), SpO2 96.00%.    General Appearance: Anxious due to nausea and vomiting and in no acute respiratory distress.    HEENT: AT/ NC, oral mucosa is moist with no lesions.  Pupils are equal, round, and reactive to light.   Lungs:  Normal respiratory rate and normal effort.    Breath sounds clear to auscultation in all lung fields.  No wheezes, rales, rhonchi or decreased breath sounds.    Heart: Normal rate.  S1 normal and S2 normal. No Murmurs, Rubs or gallops audible  Chest: Symmetric chest wall expansion.  Abdomen: Abdomen is soft, scaphoid and non-distended.  There are no signs of ascites. Bowel sounds are normal.  Diffuse abdominal tenderness in all four quadrants and epigastrium upon deep palpation.  There is no palpable mass.   Neurological: Patient is alert and oriented to person, place and time.  Normal motor strength in all extremities.    Extremities: No pretibial edema, dorsalis pedis pulses palpable  Skin:  Warm and dry.  No rash or ecchymosis.    DIAGNOSTIC LAB STUDY     Recent Labs   [facility] 06/22/12 0703 06/20/12 0739    WBC 8.27 9.05    HGB 14.1 13.5    HCT 41.5* 39.7*    PLT 270 217    MCV 78.9* 78.0*     Recent Labs   Basename 06/22/12 0703 06/21/12 0552 06/20/12 0739    NA 142 -- 139    K 3.7 -- 3.9    CL 108* -- 106    CO2 22 -- 24    BUN 10.9 -- 12.8    CREAT 1.2 -- 1.2    GLU 149* -- 168*    CA 8.7 -- 8.5    MG -- -- 1.7    PHOS -- 2.5 3.7     No results found for this basename: AST:2,ALT:2,ALKPHOS:2,PROT:2,ALB:2 in the  last 72 hours    DIAGNOSTIC IMAGING STUDIES   Radiological Procedure reviewed.  Radiology Results (24 Hour)     ** No Results found for the last 24 hours. **          CURRENT MEDICATIONS     Current Facility-Administered Medications   Medication Dose Route Frequency   . amLODIPine  10 mg Oral Daily   . famotidine  20 mg Intravenous Q12H SCH   . heparin (porcine)  5,000 Units Subcutaneous Q8H SCH   . [COMPLETED] HYDROmorphone  1 mg Intravenous Once   . insulin aspart  1-8 Units Subcutaneous Q6H SCH   . insulin glargine  20 Units Subcutaneous QHS   . lisinopril  20 mg Oral Daily   . metoclopramide  10 mg Intravenous Q6H SCH   . scopolamine  1 patch Transdermal Q72H   . sucralfate  1 g Oral TID AC & HS   . [DISCONTINUED] famotidine  20 mg Oral Q12H SCH   . [DISCONTINUED] insulin aspart  1-8 Units Subcutaneous TID AC & HS   . [DISCONTINUED] morphine  2 mg Intravenous Once  IMPRESSIONS/ PLAN:     1. Intractable Nausea/Vomiting- possible Diabetic gastroparesis exacerbation  -NPO status  -IV Zofran prn  -IV Reglan q 4 hours  -Obtain CT abdomen  -IV fluid  -c/w Scopolamine patch  -Thorazine prn hicups  -Consider GI consult per clinical course and per CT abdomen findings.    2. Diabetes Mellitus/ s/p DKA- controlled glucose levels while NPO status  -Lantus to 20 units SubQ daily due to NPO status and SSI  -FS q 6 hours    3. Abdominal pain post vomiting  -PPI  -Morphine IV prn severe pain    4. HTN controlled  -Hydralazine IV prn      -CODE STATUS: Full      Judson Roch, M.D.  06/22/2012  4:47 PM

## 2012-06-22 NOTE — Plan of Care (Signed)
Pt progressing with POC. Pt nauseous, previously medicated with zofran. Pt is sipping PO contrast for 9pm ct of abdomen. Assessment complete. Will monitor

## 2012-06-23 LAB — CBC AND DIFFERENTIAL
Basophils Absolute Automated: 0.04 (ref 0.00–0.20)
Basophils Automated: 0 %
Eosinophils Absolute Automated: 0.03 (ref 0.00–0.70)
Eosinophils Automated: 0 %
Hematocrit: 41.7 % — ABNORMAL LOW (ref 42.0–52.0)
Hgb: 14 g/dL (ref 13.0–17.0)
Immature Granulocytes Absolute: 0.04
Immature Granulocytes: 1 %
Lymphocytes Absolute Automated: 1.48 (ref 0.50–4.40)
Lymphocytes Automated: 20 %
MCH: 26.7 pg — ABNORMAL LOW (ref 28.0–32.0)
MCHC: 33.6 g/dL (ref 32.0–36.0)
MCV: 79.4 fL — ABNORMAL LOW (ref 80.0–100.0)
MPV: 9.4 fL (ref 9.4–12.3)
Monocytes Absolute Automated: 0.8 (ref 0.00–1.20)
Monocytes: 11 %
Neutrophils Absolute: 4.97 (ref 1.80–8.10)
Neutrophils: 68 %
Platelets: 265 (ref 140–400)
RBC: 5.25 (ref 4.70–6.00)
RDW: 13 % (ref 12–15)
WBC: 7.32 (ref 3.50–10.80)

## 2012-06-23 LAB — POCT GLUCOSE
Whole Blood Glucose POCT: 156 mg/dL — AB (ref 70–100)
Whole Blood Glucose POCT: 176 mg/dL — AB (ref 70–100)
Whole Blood Glucose POCT: 183 mg/dL — AB (ref 70–100)
Whole Blood Glucose POCT: 190 mg/dL — AB (ref 70–100)
Whole Blood Glucose POCT: 190 mg/dL — AB (ref 70–100)

## 2012-06-23 LAB — BASIC METABOLIC PANEL
Anion Gap: 13 (ref 5.0–15.0)
BUN: 9.6 mg/dL (ref 9.0–21.0)
CO2: 24 (ref 22–29)
Calcium: 8.9 mg/dL (ref 8.5–10.5)
Chloride: 107 (ref 98–107)
Creatinine: 1.3 mg/dL (ref 0.7–1.3)
Glucose: 193 mg/dL — ABNORMAL HIGH (ref 70–100)
Potassium: 3.6 (ref 3.5–5.1)
Sodium: 144 (ref 136–145)

## 2012-06-23 LAB — GFR: EGFR: >60

## 2012-06-23 MED ORDER — ONDANSETRON HCL 4 MG/2ML IJ SOLN
4.0000 mg | Freq: Once | INTRAMUSCULAR | Status: AC | PRN
Start: 2012-06-23 — End: 2012-06-23

## 2012-06-23 MED ORDER — PROMETHAZINE HCL 25 MG/ML IJ SOLN
6.2500 mg | Freq: Once | INTRAMUSCULAR | Status: AC | PRN
Start: 2012-06-23 — End: 2012-06-23

## 2012-06-23 MED ORDER — NALOXONE HCL 0.4 MG/ML IJ SOLN
0.1000 mg | INTRAMUSCULAR | Status: DC | PRN
Start: 2012-06-23 — End: 2012-06-25

## 2012-06-23 MED ORDER — LISINOPRIL 20 MG PO TABS
40.0000 mg | ORAL_TABLET | Freq: Every day | ORAL | Status: DC
Start: 2012-06-23 — End: 2012-06-25
  Administered 2012-06-23 – 2012-06-25 (×3): 40 mg via ORAL
  Filled 2012-06-23 (×2): qty 2
  Filled 2012-06-23: qty 1

## 2012-06-23 MED ORDER — OXYCODONE-ACETAMINOPHEN 5-325 MG PO TABS
1.0000 | ORAL_TABLET | ORAL | Status: DC | PRN
Start: 2012-06-23 — End: 2012-06-25

## 2012-06-23 MED ORDER — DIPHENHYDRAMINE HCL 50 MG/ML IJ SOLN
12.5000 mg | Freq: Four times a day (QID) | INTRAMUSCULAR | Status: DC | PRN
Start: 2012-06-23 — End: 2012-06-25

## 2012-06-23 MED ORDER — INSULIN ASPART 100 UNIT/ML SC SOLN
1.0000 [IU] | Freq: Three times a day (TID) | SUBCUTANEOUS | Status: DC
Start: 2012-06-23 — End: 2012-06-24
  Administered 2012-06-23: 1 [IU] via SUBCUTANEOUS
  Administered 2012-06-24: 2 [IU] via SUBCUTANEOUS
  Filled 2012-06-23 (×3): qty 20
  Filled 2012-06-23: qty 10

## 2012-06-23 MED ORDER — GLUCOSE 40 % PO GEL
15.0000 g | ORAL | Status: DC | PRN
Start: 2012-06-23 — End: 2012-06-25

## 2012-06-23 MED ORDER — PANTOPRAZOLE SODIUM 40 MG IV SOLR
40.0000 mg | Freq: Every evening | INTRAVENOUS | Status: DC
Start: 2012-06-24 — End: 2012-06-25
  Administered 2012-06-24 (×2): 40 mg via INTRAVENOUS
  Filled 2012-06-23 (×2): qty 40

## 2012-06-23 MED ORDER — LORAZEPAM 2 MG/ML IJ SOLN
1.0000 mg | Freq: Three times a day (TID) | INTRAMUSCULAR | Status: DC | PRN
Start: 2012-06-23 — End: 2012-06-25
  Administered 2012-06-23 – 2012-06-24 (×2): 1 mg via INTRAVENOUS
  Filled 2012-06-23 (×2): qty 1

## 2012-06-23 MED ORDER — HYDROMORPHONE HCL PF 1 MG/ML IJ SOLN
1.00 mg | Freq: Once | INTRAMUSCULAR | Status: AC
Start: 2012-06-23 — End: 2012-06-23
  Administered 2012-06-23: 1 mg via INTRAVENOUS
  Filled 2012-06-23: qty 1

## 2012-06-23 MED ORDER — HYDROMORPHONE HCL PF 1 MG/ML IJ SOLN
1.0000 mg | INTRAMUSCULAR | Status: DC | PRN
Start: 2012-06-23 — End: 2012-06-23
  Administered 2012-06-23 (×4): 1 mg via INTRAVENOUS
  Filled 2012-06-23 (×4): qty 1

## 2012-06-23 MED ORDER — INSULIN GLARGINE 100 UNIT/ML SC SOLN
25.0000 [IU] | Freq: Every evening | SUBCUTANEOUS | Status: DC
Start: 2012-06-23 — End: 2012-06-24
  Administered 2012-06-23: 25 [IU] via SUBCUTANEOUS
  Filled 2012-06-23: qty 10

## 2012-06-23 MED ORDER — HYDROMORPHONE PCA 0.2 MG/ML 100 ML (OUTSOURCED)
INTRAVENOUS | Status: DC
Start: 2012-06-23 — End: 2012-06-25
  Administered 2012-06-23: 20 mg via INTRAVENOUS
  Filled 2012-06-23 (×2): qty 100

## 2012-06-23 MED ORDER — HYDRALAZINE HCL 20 MG/ML IJ SOLN
10.00 mg | Freq: Four times a day (QID) | INTRAMUSCULAR | Status: DC | PRN
Start: 2012-06-23 — End: 2012-06-25
  Administered 2012-06-23 – 2012-06-24 (×5): 10 mg via INTRAVENOUS
  Filled 2012-06-23 (×6): qty 1

## 2012-06-23 NOTE — Plan of Care (Signed)
Patient on IV pain, nausea meds, still with emesis at times. Has not attempted to eat any food, liquids.

## 2012-06-23 NOTE — Consults (Signed)
Full note dictated.  Recommend:  1.  I spoke to Dr.  Michail Jewels to consult on patient to provide recommendations for reduce narcotic analgesics. Attempt to taper and discontinue use of narcotics.  2. Discontinue sucralfate and pepcid.  Start protonix 40 iv q 12 hours.  3. Nutrition consult to counsel patient on dietary modifications to improve gastric emptying.  In the meantime, order a dairy free, pureed diet.

## 2012-06-23 NOTE — Progress Notes (Signed)
Consulted by Dr. Jens Som to evaluate patient with intractable nausea and moderate to severe abdominal pain requiring narcotics. Patient currently on Scopolamine patch, reglan, ondansatron, phenergan for nausea and hydromorphone 1 mg Q 3 hours for abdominal pain.     Narcotic requirement, though not high, may well be contributing to this patient's nausea. Will start PCA tonight to see whether he is able to reduce his narcotic use, through fractional self-administered dosing. Will re-evaluate in am.    Chart reviewed, medication use discussed in detail with nursing, and plan reviewed with Dr. Jens Som.    Thank you.

## 2012-06-23 NOTE — Progress Notes (Signed)
Progress Note    [name] UJW:[id number] is a 37 y.o. male, Outpatient Primary MD for the patient is [name], MD   chief complaint ABD PAIN    06/23/2012       Assessment & Plan   1. Intractable Nausea/Vomiting- possible Diabetic gastroparesis exacerbation   - consult GI :called Dr Jens Som  -IV Zofran prn   -IV Reglan   - add benzo's prn   -CT abdomen unremarkable  -IV fluid   -Scopolamine patch   -Thorazine prn hicups   2. Diabetes Mellitus/ s/p DKA- controlled glucose levels while NPO status   - increase lantus to 25 units SubQ daily and SSI   -FS q 6 hours   3. Abdominal pain post vomiting   -PPI   -Morphine IV prn severe pain   4. HTN controlled   -Hydralazine IV prn    Time spent for evaluation, management and coordination of care:   30 minutes    Type of Admission:   inpt    Reason for extended stay:   Await symptomatic improvement      Subjective:     [name] today continues to have nausea with 3 episodes of emesis today  No chest pain,continues epigastric/RUQ pain    No new weakness tingling or numbness, No Cough - SOB.    Objective:     Blood pressure 193/99, pulse 101, temperature 98.2 F (36.8 C), temperature source Temporal Artery, resp. rate 18, height 1.905 m (6\' 3" ), weight 127.1 kg (280 lb 3.3 oz), SpO2 98.00%.      Intake/Output Summary (Last 24 hours) at 06/23/12 1510  Last data filed at 06/23/12 1200   Gross per 24 hour   Intake      0 ml   Output   1170 ml   Net  -1170 ml     Scheduled Meds:  Current Facility-Administered Medications   Medication Dose Route Frequency   . amLODIPine  10 mg Oral Daily   . famotidine  20 mg Intravenous Q12H SCH   . heparin (porcine)  5,000 Units Subcutaneous Q8H SCH   . [COMPLETED] HYDROmorphone  1 mg Intravenous Once   . insulin aspart  1-12 Units Subcutaneous TID AC   . insulin glargine  25 Units Subcutaneous QHS   . lisinopril  40 mg Oral Daily   . metoclopramide  10 mg Intravenous Q6H SCH   . scopolamine  1 patch Transdermal  Q72H   . sucralfate  1 g Oral TID AC & HS   . [DISCONTINUED] insulin aspart  1-8 Units Subcutaneous Q6H SCH   . [DISCONTINUED] insulin glargine  20 Units Subcutaneous QHS   . [DISCONTINUED] lisinopril  20 mg Oral Daily     Continuous Infusions:     . sodium chloride 75 mL/hr at 06/23/12 1440     PRN Meds:.acetaminophen, chlorproMAZINE, dextrose, dextrose, dextrose, glucagon (rDNA), hydrALAZINE, HYDROmorphone, [COMPLETED] iodixanol, LORazepam, ondansetron, promethazine, [DISCONTINUED] hydrALAZINE, [DISCONTINUED] HYDROmorphone, [DISCONTINUED] labetalol    Exam  Awake Alert, Oriented *3, No new F.N deficits, Normal affect  NC.AT,PERRAL  Supple Neck,No JVD, No cervical lymphadenopathy appriciated.   Symmetrical Chest wall movement, Good air movement bilaterally, CTAB  RRR,No Gallops,Rubs or new Murmurs, No Parasternal Heave  +ve B.Sounds, Abd Soft, Non tender, No organomegaly appriciated, No rebound -guarding or rigidity.  No Cyanosis, Clubbing or edema, No new Rash or bruise      Data Review   CBC w Diff: Lab Results   Component Value Date  WBC 7.32 06/23/2012    HGB 14.0 06/23/2012    HGB 13.3 01/13/2011    HCT 41.7* 06/23/2012    PLT 265 06/23/2012    PLT 291 01/13/2011    LYMPHOPCT 27.2 01/11/2011    MONOPCT 6.0 01/11/2011    EOSPCT 0.9 01/11/2011     CMP: Lab Results   Component Value Date    NA 144 06/23/2012    K 3.6 06/23/2012    CL 107 06/23/2012    CO2 24 06/23/2012    BUN 9.6 06/23/2012    GLU 193* 06/23/2012    PROT 8.8* 06/17/2012    PROT 6.0* 01/13/2011    CA 8.9 06/23/2012    ALKPHOS 122 06/17/2012    AST 31 06/17/2012    ALT 36 06/17/2012      Cardiac markers: Lab Results   Component Value Date    MYOGLOBIN 54.7 01/10/2011         Radiology Reports  Ct Abdomen Pelvis W Iv And Po Cont    06/22/2012  EXAM: CT OF THE ABDOMEN AND PELVIS  TECHNIQUE: 5-mm axial CT images of the abdomen and pelvis were obtained  following bolus administration of nonionic intravenous contrast. Patient was administered a total of 125 mL of Visipaque  contrast. Oral contrast was also administered.  HISTORY:  Nausea and vomiting  FINDINGS: The large and small bowel demonstrate normal caliber. There is no evidence of obstruction or ileus. No pathologic bowel wall thickening appreciated. There is mild thickening/fluid and infiltration of the left paracolic gutter of uncertain etiology. No free intraperitoneal air is seen. There is no mesenteric or pelvic adenopathy.  The pancreas, spleen, kidneys, adrenal glands are unremarkable. The gallbladder surgically absent. There are 2 punctate subcentimeter low-attenuation lesions within the hepatic dome region which are too small to characterize on this study. No other liver lesions are seen. The bladder and prostate are also unremarkable.  Bone window images are unremarkable.   Minimal dependent atelectatic changes noted at the left lung base.      06/22/2012   1. Unremarkable computed tomography examination of the abdomen and pelvis without imaging correlate for patient's presenting pain symptoms.  Sable Feil, MD  06/22/2012 10:04 PM     Xr Abdomen 2 View With Chest 1 View    06/21/2012  CLINICAL HISTORY: Nausea and vomiting.  FINDINGS: PA view of the chest and supine/upright views of the abdomen. Compared with prior studies including 06/17/2012.  Central silhouette and pulmonary vascularity are normal. No focal consolidation or free intraperitoneal air.  Staples in the right upper quadrant. Small-to-moderate volume of gas and stool in portions of the colon. Small volume of gas in portions of small bowel, particularly in the right upper quadrant. No dilated loops of bowel or abnormal calcifications.      06/21/2012   Unremarkable bowel gas pattern.  Darra Lis, MD  06/21/2012 4:23 PM     Abdomen 2 View With Chest 1 View    06/17/2012  HISTORY: Gastroparesis, nausea, vomiting, abdominal pain.    COMPARISON: 01/10/2011 chest radiograph. 01/11/2011 abdominal radiographs.  Portable chest radiograph. The lung volumes are  low. The heart is top normal size accounting for the low lung volumes. The mediastinal contour is normal. There is no pneumothorax or pleural effusion. The lungs are clear. The thoracic osseous structures appear intact.  Supine and erect abdominal radiographs were obtained. A mildly dilated small bowel loop is noted on the upright radiograph in the right lower abdomen.  There is a relative paucity of small bowel gas in the central abdomen. Mild stool is noted throughout the colon. No air-fluid levels are noted. No suspicious soft tissue calcification, pneumatosis, or pneumoperitoneum is evident.Surgical clips are noted in the right upper abdomen.      06/17/2012     1. Low lung volumes with clear lungs.  2. Mildly dilated small bowel loop in the right lower abdomen. Relative possibly of small bowel gas. No air-fluid levels. A mild distal small bowel obstruction cannot be excluded.  Finding were called to and acknowledged by [name], MD on 06/17/2012 6:28 PM.   Cleone Slim, MD  06/17/2012 6:29 PM     ?

## 2012-06-23 NOTE — Progress Notes (Signed)
Nutritional Support Services  Nutrition Assessment    [name] 37 y.o. male   MRN: [id number]    Referral Source: LOS screen    Nutrition Summary/Diet history:  Pt adm with DKA, hospital course complicated by persistent nausea, vomiting and diffuse abdominal pain. Pt is noted to have GERD, gastritis and gastroparesis.  Abdominal CT 06/22/12 was unremarkable.  Per MD, possible GI consult.     Nutrition Diagnosis:   Inadequate oral intake related to nausea/vomiting and abdominal pain as evidenced by patient interview.    Intervention:  1. Cont with carb consistent diet, recommend glucerna shakes with meals for support  2. If pt conts with N/V and unable to accept > 50% meals po, please consult for nutrition support recommendations.  3.   Reviewed carb consistent diet with diet copies and contact info provided.      Goal:  Pt will tolerate >50% of Carb consistent diet/supplements in the next 48 hrs.    Monitoring:   Evaluation:   1. PO intake   minimal  2. Weights   monitor  3. GI symptoms  + N/V, diffuse abd pain    Nutrition risk level: high    Assessment Data:  Adm dx:  DKA (diabetic ketoacidoses)   Patient Active Problem List   Diagnosis   . DKA (diabetic ketoacidoses)   . GERD (gastroesophageal reflux disease)   . Gastroparesis   . Dehydration, moderate   . Gastritis without bleeding   . AKI (acute kidney injury)     PMH:  has a past medical history of Diabetes and Hypertensive disorder.  PSH:  has past surgical history that includes Cholecystectomy.  Pertinent labs:  Lab 06/23/12 0630 06/22/12 0703 06/21/12 0552 06/20/12 0739 06/19/12 0630 06/18/12 1713 06/18/12 0633   NA 144 142 -- 139 140 139 --   K 3.6 3.7 -- 3.9 3.7 4.1 --   CL 107 108* -- 106 108* 107 --   CO2 24 22 -- 24 24 25  --   BUN 9.6 10.9 -- 12.8 10.7 11.4 --   CREAT 1.3 1.2 -- 1.2 1.1 1.3 --   GLU 193* 149* -- 168* 147* 244* --   CA 8.9 8.7 -- 8.5 8.6 8.7 --   MG -- -- -- 1.7 1.6 -- 1.8   PHOS -- -- 2.5 3.7 3.4 -- --    POCT 129-260, minimal po  intake    Pertinent meds:famotidine, insulin, metocopramide, sucralfate, scopolamine  Social history:  Lives with fiance  Food Intolerance/Religious/Ethnic preferences: n/a  Diet Order:  Orders Placed This Encounter   Procedures   . Diet consistent carbohydrate      Food intake: minimal  GI symptoms: diffuse abdominal pain all 4 quadrants, + N/V  Hydration:      . sodium chloride 125 mL/hr at 06/23/12 5409     Skin: intact  Anthropometrics  Height: 190.5 cm (6\' 3" )  Weight: 127.1 kg (280 lb 3.3 oz)  Weight Change: 0   IBW/kg (Calculated) Male: 79.51 kg  IBW/kg (Calculated) Male: 89.13 kg  BMI (calculated): 35.1   Wt Readings from Last 30 Encounters:   06/17/12 127.1 kg (280 lb 3.3 oz)     Learning Needs: reviewed carb consistent diet, provided diet copy and contact info.   No Known Allergies    Marcha Solders, RD

## 2012-06-24 LAB — CBC AND DIFFERENTIAL
Basophils Absolute Automated: 0.05 (ref 0.00–0.20)
Basophils Automated: 1 %
Eosinophils Absolute Automated: 0.06 (ref 0.00–0.70)
Eosinophils Automated: 1 %
Hematocrit: 43.2 % (ref 42.0–52.0)
Hgb: 14.3 g/dL (ref 13.0–17.0)
Immature Granulocytes Absolute: 0.05
Immature Granulocytes: 1 %
Lymphocytes Absolute Automated: 1.7 (ref 0.50–4.40)
Lymphocytes Automated: 22 %
MCH: 26.5 pg — ABNORMAL LOW (ref 28.0–32.0)
MCHC: 33.1 g/dL (ref 32.0–36.0)
MCV: 80 fL (ref 80.0–100.0)
MPV: 9.3 fL — ABNORMAL LOW (ref 9.4–12.3)
Monocytes Absolute Automated: 0.75 (ref 0.00–1.20)
Monocytes: 10 %
Neutrophils Absolute: 5.17 (ref 1.80–8.10)
Neutrophils: 67 %
Platelets: 286 (ref 140–400)
RBC: 5.4 (ref 4.70–6.00)
RDW: 13 % (ref 12–15)
WBC: 7.73 (ref 3.50–10.80)

## 2012-06-24 LAB — BASIC METABOLIC PANEL
Anion Gap: 13 (ref 5.0–15.0)
BUN: 9.2 mg/dL (ref 9.0–21.0)
CO2: 24 (ref 22–29)
Calcium: 8.7 mg/dL (ref 8.5–10.5)
Chloride: 107 (ref 98–107)
Creatinine: 1.3 mg/dL (ref 0.7–1.3)
Glucose: 178 mg/dL — ABNORMAL HIGH (ref 70–100)
Potassium: 3.6 (ref 3.5–5.1)
Sodium: 144 (ref 136–145)

## 2012-06-24 LAB — GFR: EGFR: >60

## 2012-06-24 LAB — POCT GLUCOSE
Whole Blood Glucose POCT: 197 mg/dL — AB (ref 70–100)
Whole Blood Glucose POCT: 162 mg/dL — AB (ref 70–100)
Whole Blood Glucose POCT: 170 mg/dL — AB (ref 70–100)
Whole Blood Glucose POCT: 204 mg/dL — AB (ref 70–100)

## 2012-06-24 MED ORDER — POTASSIUM CHLORIDE CRYS ER 20 MEQ PO TBCR
40.0000 meq | EXTENDED_RELEASE_TABLET | Freq: Once | ORAL | Status: DC
Start: 2012-06-24 — End: 2012-06-25
  Filled 2012-06-24: qty 2

## 2012-06-24 MED ORDER — INSULIN GLARGINE 100 UNIT/ML SC SOLN
30.0000 [IU] | Freq: Every evening | SUBCUTANEOUS | Status: DC
Start: 2012-06-24 — End: 2012-06-25
  Administered 2012-06-24: 30 [IU] via SUBCUTANEOUS
  Filled 2012-06-24: qty 300

## 2012-06-24 MED ORDER — DEXTROSE 50 % IV SOLN
25.0000 mL | INTRAVENOUS | Status: DC | PRN
Start: 2012-06-24 — End: 2012-06-25

## 2012-06-24 MED ORDER — SCOPOLAMINE 1 MG/3DAYS TD PT72
1.00 | MEDICATED_PATCH | TRANSDERMAL | Status: DC
Start: 2012-06-24 — End: 2012-06-25
  Administered 2012-06-24: 1 via TRANSDERMAL
  Filled 2012-06-24 (×2): qty 1

## 2012-06-24 MED ORDER — GLUCAGON HCL (RDNA) 1 MG IJ SOLR
1.00 mg | INTRAMUSCULAR | Status: DC | PRN
Start: 2012-06-24 — End: 2012-06-25

## 2012-06-24 MED ORDER — POTASSIUM CHLORIDE 10 MEQ/100ML IV SOLN
10.0000 meq | INTRAVENOUS | Status: AC
Start: 2012-06-24 — End: 2012-06-24
  Administered 2012-06-24 (×4): 10 meq via INTRAVENOUS
  Filled 2012-06-24 (×3): qty 100

## 2012-06-24 MED ORDER — INSULIN ASPART 100 UNIT/ML SC SOLN
1.00 [IU] | SUBCUTANEOUS | Status: DC | PRN
Start: 2012-06-24 — End: 2012-06-25
  Administered 2012-06-24 – 2012-06-25 (×2): 1 [IU] via SUBCUTANEOUS
  Filled 2012-06-24 (×2): qty 10

## 2012-06-24 MED ORDER — GLUCOSE 40 % PO GEL
15.0000 g | ORAL | Status: DC | PRN
Start: 2012-06-24 — End: 2012-06-25

## 2012-06-24 NOTE — Progress Notes (Signed)
Patient still complaining of nausea and vomitting.  Infrequent PCA  "Afraid of N and V".  Scopolamine patch ordered and directed to be placed behind ear.

## 2012-06-24 NOTE — Progress Notes (Signed)
Pt sleeping, call bell within reach.  Report given to oncoming nurse.

## 2012-06-24 NOTE — Progress Notes (Signed)
ANESTHESIOLOGY PAIN SERVICE ATTENDING ADDENDUM    Patient seen and examined. Case discussed with pain nurse. Agree with documented assessment and plan.    Kyrianna Barletta, MD  [facility] Anesthesia Department

## 2012-06-24 NOTE — Progress Notes (Signed)
Assessment completed.  Pt placed on PCA as stated by MD order.  Due to elevated BP, PRN BP medication was administered.  Pt continues to vomit, so PRN antiemetic was administered as well.  Call light within reach, will continue to monitor.

## 2012-06-24 NOTE — Progress Notes (Signed)
Progress Note    Criston Chancellor [id number] is a 37 y.o. male, Outpatient Primary MD for the patient is [name], MD   chief complaint ABD PAIN    06/24/2012       Assessment & Plan   1. Intractable Nausea/Vomiting- likely multifactorial:narcotics/gastroparesis/ history of significant gastritis   - appreciate GI input ; anesthesia has started pt on PCA to gradually wean off  -resume supportive rx with IV Zofran prn ,Reglan , benzo's prn   -CT abdomen unremarkable  -IV fluid   -consider a trial with erythromycin if no improvement   2. Diabetes Mellitus/ s/p DKA-  A1c 9.9   -Improved  glucose levels   - increase lantus to 30 units SubQ daily (pt takes 37 units at home) ; SSI   -FS q 6 hours   3. Abdominal pain post vomiting   -PPI bid  4. HTN uncontrolled   -on norvasc and lisinopril ;Hydralazine IV prn    Time spent for evaluation, management and coordination of care:   30 minutes    Type of Admission:   inpt    Reason for extended stay:   Await symptomatic improvement      Subjective:     Osborne Oman today continues to have nausea with 2  episodes of emesis this am ; felt better through the night     No chest pain,continues epigastric/RUQ pain ; on PCA   No new weakness tingling or numbness, No Cough - SOB.    Objective:     Blood pressure 181/107, pulse 121, temperature 98 F (36.7 C), temperature source Temporal Artery, resp. rate 18, height 1.905 m (6\' 3" ), weight 127.1 kg (280 lb 3.3 oz), SpO2 97.00%.      Intake/Output Summary (Last 24 hours) at 06/24/12 1204  Last data filed at 06/24/12 0700   Gross per 24 hour   Intake    904 ml   Output     10 ml   Net    894 ml     Scheduled Meds:  Current Facility-Administered Medications   Medication Dose Route Frequency   . amLODIPine  10 mg Oral Daily   . heparin (porcine)  5,000 Units Subcutaneous Q8H SCH   . insulin aspart  1-12 Units Subcutaneous TID AC   . insulin glargine  30 Units Subcutaneous QHS   . lisinopril  40 mg Oral Daily   .  metoclopramide  10 mg Intravenous Q6H SCH   . pantoprazole  40 mg Intravenous QHS   . potassium chloride  10 mEq Intravenous Q1H   . potassium chloride SA  40 mEq Oral Once   . scopolamine  1 patch Transdermal Q72H   . [DISCONTINUED] famotidine  20 mg Intravenous Q12H SCH   . [DISCONTINUED] insulin aspart  1-8 Units Subcutaneous Q6H SCH   . [DISCONTINUED] insulin glargine  20 Units Subcutaneous QHS   . [DISCONTINUED] insulin glargine  25 Units Subcutaneous QHS   . [DISCONTINUED] lisinopril  20 mg Oral Daily   . [DISCONTINUED] scopolamine  1 patch Transdermal Q72H   . [DISCONTINUED] sucralfate  1 g Oral TID AC & HS     Continuous Infusions:       . sodium chloride 75 mL/hr at 06/24/12 0518   . HYDROmorphone 20 mg (06/23/12 2222)     PRN Meds:.acetaminophen, chlorproMAZINE, dextrose, dextrose, dextrose, diphenhydrAMINE, glucagon (rDNA), hydrALAZINE, LORazepam, naloxone, ondansetron, [EXPIRED] ondansetron, oxyCODONE-acetaminophen, promethazine, [EXPIRED] promethazine, [DISCONTINUED] HYDROmorphone, [DISCONTINUED] labetalol    Exam  Awake Alert, Oriented *3, No new F.N deficits, Normal affect  NC.AT,PERRAL  Supple Neck,No JVD, No cervical lymphadenopathy appriciated.   Symmetrical Chest wall movement, Good air movement bilaterally, CTAB  RRR,No Gallops,Rubs or new Murmurs, No Parasternal Heave  +ve B.Sounds, Abd Soft, Non tender, No organomegaly appriciated, No rebound -guarding or rigidity.  No Cyanosis, Clubbing or edema, No new Rash or bruise      Data Review   CBC w Diff: Lab Results   Component Value Date    WBC 7.73 06/24/2012    HGB 14.3 06/24/2012    HGB 13.3 01/13/2011    HCT 43.2 06/24/2012    PLT 286 06/24/2012    PLT 291 01/13/2011    LYMPHOPCT 27.2 01/11/2011    MONOPCT 6.0 01/11/2011    EOSPCT 0.9 01/11/2011     CMP: Lab Results   Component Value Date    NA 144 06/24/2012    K 3.6 06/24/2012    CL 107 06/24/2012    CO2 24 06/24/2012    BUN 9.2 06/24/2012    GLU 178* 06/24/2012    PROT 8.8* 06/17/2012    PROT 6.0*  01/13/2011    CA 8.7 06/24/2012    ALKPHOS 122 06/17/2012    AST 31 06/17/2012    ALT 36 06/17/2012      Cardiac markers: Lab Results   Component Value Date    MYOGLOBIN 54.7 01/10/2011         Radiology Reports  Ct Abdomen Pelvis W Iv And Po Cont    06/22/2012  EXAM: CT OF THE ABDOMEN AND PELVIS  TECHNIQUE: 5-mm axial CT images of the abdomen and pelvis were obtained  following bolus administration of nonionic intravenous contrast. Patient was administered a total of 125 mL of Visipaque contrast. Oral contrast was also administered.  HISTORY:  Nausea and vomiting  FINDINGS: The large and small bowel demonstrate normal caliber. There is no evidence of obstruction or ileus. No pathologic bowel wall thickening appreciated. There is mild thickening/fluid and infiltration of the left paracolic gutter of uncertain etiology. No free intraperitoneal air is seen. There is no mesenteric or pelvic adenopathy.  The pancreas, spleen, kidneys, adrenal glands are unremarkable. The gallbladder surgically absent. There are 2 punctate subcentimeter low-attenuation lesions within the hepatic dome region which are too small to characterize on this study. No other liver lesions are seen. The bladder and prostate are also unremarkable.  Bone window images are unremarkable.   Minimal dependent atelectatic changes noted at the left lung base.      06/22/2012   1. Unremarkable computed tomography examination of the abdomen and pelvis without imaging correlate for patient's presenting pain symptoms.  Sable Feil, MD  06/22/2012 10:04 PM     Xr Abdomen 2 View With Chest 1 View    06/21/2012  CLINICAL HISTORY: Nausea and vomiting.  FINDINGS: PA view of the chest and supine/upright views of the abdomen. Compared with prior studies including 06/17/2012.  Central silhouette and pulmonary vascularity are normal. No focal consolidation or free intraperitoneal air.  Staples in the right upper quadrant. Small-to-moderate volume of gas and stool in portions  of the colon. Small volume of gas in portions of small bowel, particularly in the right upper quadrant. No dilated loops of bowel or abnormal calcifications.      06/21/2012   Unremarkable bowel gas pattern.  Darra Lis, MD  06/21/2012 4:23 PM     Abdomen 2 View With Chest 1 View  06/17/2012  HISTORY: Gastroparesis, nausea, vomiting, abdominal pain.    COMPARISON: 01/10/2011 chest radiograph. 01/11/2011 abdominal radiographs.  Portable chest radiograph. The lung volumes are low. The heart is top normal size accounting for the low lung volumes. The mediastinal contour is normal. There is no pneumothorax or pleural effusion. The lungs are clear. The thoracic osseous structures appear intact.  Supine and erect abdominal radiographs were obtained. A mildly dilated small bowel loop is noted on the upright radiograph in the right lower abdomen. There is a relative paucity of small bowel gas in the central abdomen. Mild stool is noted throughout the colon. No air-fluid levels are noted. No suspicious soft tissue calcification, pneumatosis, or pneumoperitoneum is evident.Surgical clips are noted in the right upper abdomen.      06/17/2012     1. Low lung volumes with clear lungs.  2. Mildly dilated small bowel loop in the right lower abdomen. Relative possibly of small bowel gas. No air-fluid levels. A mild distal small bowel obstruction cannot be excluded.  Finding were called to and acknowledged by [name], MD on 06/17/2012 6:28 PM.   Cleone Slim, MD  06/17/2012 6:29 PM     ?

## 2012-06-24 NOTE — Progress Notes (Signed)
Pt has been vomiting throughout the night.  Antiemetics administered.

## 2012-06-24 NOTE — Progress Notes (Signed)
Assessment complete. POC and Safety Plan reviewed with pt. Zofran IV given for nausea. No distress or pain noted. Call bell within reach. Will monitor.

## 2012-06-24 NOTE — Consults (Signed)
Service Date: 06/23/2012     Patient Type: I     CONSULTING PHYSICIAN: Donella Stade MD     REFERRING PHYSICIAN:      IDENTIFICATION AND CHIEF COMPLAINT:  This is a 37 year old African American male being seen at Dr. Glenis Smoker  request for epigastric abdominal pain, nausea and vomiting.     HISTORY OF PRESENT ILLNESS:  The patient has a history of diabetes and noncompliance with outpatient  regimen.  He reports today that his employer switched insurance and company  policy is requiring a $1000 out-of-pocket deductible prior to 70% coverage  for medications.  This did not allow him to obtain all recommended  medications as an outpatient.  The patient has required multiple admissions  over the last several years for DKA with associated abdominal pain, nausea  and vomiting.     He was admitted on Jun 17, 2012 with recurrent DKA.  With supportive care  and bringing his glucose level to a more optimal level he felt  significantly improved.  He then reports being given a meal that contained  milk.  He is lactose intolerant.  After this meal he describes  deterioration in his gastrointestinal status with severe epigastric pain,  nausea and vomiting.  The patient has been requiring narcotic analgesics on  a regular basis to alleviate his pain.     He has not experienced any hematemesis, dysphagia, odynophagia, fever,  chills, diarrhea, hematochezia, black stool, hematuria or dysuria.     PAST MEDICAL HISTORY:  Diabetes with multiple hospitalizations for DKA associated with  noncompliance with medications as an outpatient.  As part of his  hospitalization the signs and symptoms have included epigastric abdominal  pain, nausea and vomiting.     An EGD was performed by Dr. Welton Flakes on October 29, 2010 at which time there  was diffuse gastritis with multiple gastric erosions and hemorrhagic  gastritis in the fundus.  Esophageal erosions were consistent with reflux.   Gastric antral biopsies were unremarkable.  A distal esophageal  biopsy  showed evidence of Barrett's esophagus and chronic inflammation.  It was  negative for dysplasia.  Gastric biopsies for pyloritek were negative.     He had a cholecystectomy by Dr. Jed Limerick on December 04, 2010.  Gastroparesis,  diabetic neuropathy involving both of his hands and feet, hypertension,  hyperlipidemia, left-sided ACL of his knee, GERD, rhabdomyolysis and  anemia.     ALLERGIES:  None.     MEDICATIONS:  Amlodipine, famotidine, heparin, insulin, lisinopril, metoclopramide 10 mg  IV q.i.d., scopolamine, sucralfate and hydromorphone p.r.n. for pain.     SOCIAL HISTORY:  No history of smoking.  He drinks alcohol socially.  He is employed.     FAMILY HISTORY:  Two grandmothers and father with diabetes.     PHYSICAL EXAMINATION:  VITAL SIGNS:  Blood pressure 163/97, heart rate 103, respiratory rate 16,  96% oxygen saturation on room air and temperature 99.1 degrees Fahrenheit.  SKIN:  No jaundice or significant rash seen.  HEENT:  No icterus or lymphadenopathy.  RESPIRATORY:  No rales, rhonchi, or wheezing.  CARDIOVASCULAR:  Normal S1 and S2.  No S3 or S4, gallops or rubs.  ABDOMEN:  Soft, nondistended, no significant tenderness elicited.  No  hepatosplenomegaly or masses detected.  EXTREMITIES:  No edema or erythema     LABORATORY AND DIAGNOSTIC DATA:  Results reviewed in Epic system.     IMPRESSION AND PLAN:  1.  Epigastric pain, nausea and vomiting.  This is likely multifactorial.   My concern is that the patient has been placed on narcotic analgesics which  can result in nausea and vomiting as a common side effect.  He has been  taking frequent doses of hydromorphone.  In addition, these narcotics will  exacerbate his underlying gastroparesis.  His gastroparesis was likely  severe in the setting of DKA.  The patient also has a history of  significant gastritis without evidence of H. pylori infection.  Radiologic  imaging has not demonstrated any evidence of gastric outlet or bowel  obstruction.  There  are no anatomic abnormalities that would be a  contributing factor or cause.  The patient has not responded to the current  regimen which has included metoclopramide, Pepcid and sucralfate.  I am  recommending to discontinue sucralfate and Pepcid.  Start pantoprazole 40  mg IV every 12 hours.  I have also asked Dr. Michail Jewels from the department of  anesthesia to consult on the patient to determine if we can place the  patient on a regimen to attempt to taper and discontinue narcotic  analgesics to transition to nonnarcotic analgesics.  Pending his response  to the above consider a trial with erythromycin at 250 mg orally 3 times  daily before meals.  Also consider diphenhydramine 12.5 mg p.o. every 6 to  8 hours p.r.n. and ondansetron 40 mg p.o. t.i.d.    2.  Nutrition.  the patient reports being lactose intolerant but being  given milk which he consumed and resulted in deterioration of his overall  GI status as outlined above.  We will recommend a pureed dairy free diet.   We will request a nutrition consultation to counsel the patient on dietary  modifications to improve gastric emptying.    3.  History of Barrett's esophagus.  If not done so as an inpatient I will  recommend an EGD as an outpatient with biopsies of the squamocolumnar  junction to reassess Barrett's esophagus and dysplasia.    4.  Social work issues.  The patient warrants a social work consult to  determine programs that would be available to the patient to afford his  medications as an outpatient to attempt to reduce the number of  hospitalizations.  5.  Diabetes.  The patient's glucose control has improved significantly but  he is still hyperglycemic.  I will defer to the hospitalist for multiple  management of his serum glucose levels.  Once more effectively controlled  his gastroparesis should improve.           D:  06/23/2012 23:48 PM by Dr. Catheryn Bacon. Jens Som, MD [phone])  T:  06/24/2012 06:14 AM by Justice Deeds      Everlean Cherry: [id number]) (Doc ID: [id number])

## 2012-06-24 NOTE — Progress Notes (Signed)
Nutritional Support Services  Nutrition Education    Consult received to educate pt on diet guidelines for gastroparesis.  Provided written information and number to call if questions.  Diet guidelines reviewed verbally with patient.  Good understanding shown.    Geryl Councilman, RD, CNSC, CSO  [place] - [phone]

## 2012-06-24 NOTE — Progress Notes (Signed)
PROGRESS NOTE    Date Time: 06/24/2012 7:40 PM  Patient Name: [name],[name]      Problem List:     Patient Active Problem List   Diagnosis   . DKA (diabetic ketoacidoses)   . GERD (gastroesophageal reflux disease)   . Gastroparesis   . Dehydration, moderate   . Gastritis without bleeding   . AKI (acute kidney injury)       Subjective:   Patient significantly improved.  Tolerating liquids.  No more nausea or vomiting.  Epigastric pain improved.    Medications:     Current Facility-Administered Medications   Medication Dose Route Frequency   . amLODIPine  10 mg Oral Daily   . heparin (porcine)  5,000 Units Subcutaneous Q8H SCH   . insulin aspart  1-12 Units Subcutaneous TID AC   . insulin glargine  30 Units Subcutaneous QHS   . lisinopril  40 mg Oral Daily   . metoclopramide  10 mg Intravenous Q6H SCH   . pantoprazole  40 mg Intravenous QHS   . [COMPLETED] potassium chloride  10 mEq Intravenous Q1H   . potassium chloride SA  40 mEq Oral Once   . scopolamine  1 patch Transdermal Q72H   . [DISCONTINUED] famotidine  20 mg Intravenous Q12H SCH   . [DISCONTINUED] insulin glargine  25 Units Subcutaneous QHS   . [DISCONTINUED] scopolamine  1 patch Transdermal Q72H   . [DISCONTINUED] sucralfate  1 g Oral TID AC & HS       Physical Exam:     Filed Vitals:    06/24/12 1820   BP: 174/93   Pulse: 92   Temp: 98.2 F (36.8 C)   Resp: 18   SpO2: 98%       Intake and Output Summary (Last 24 hours) at Date Time    Intake/Output Summary (Last 24 hours) at 06/24/12 1940  Last data filed at 06/24/12 1820   Gross per 24 hour   Intake   1464 ml   Output      0 ml   Net   1464 ml         Labs:       Lab 06/24/12 0659 06/24/12 0654 06/23/12 0630 06/22/12 0703 06/20/12 0739 06/19/12 0630   WBC -- 7.73 7.32 8.27 9.05 5.41   RBC -- 5.40 5.25 5.26 5.09 5.18   HGB -- 14.3 14.0 14.1 13.5 13.6   HCT -- 43.2 41.7* 41.5* 39.7* 41.2*   PLT -- 286 265 270 217 205   GLU 178* -- 193* 149* 168* 147*   BUN 9.2 -- 9.6 10.9 12.8 10.7   CREAT 1.3 -- 1.3 1.2  1.2 1.1   CA 8.7 -- 8.9 8.7 8.5 8.6   NA 144 -- 144 142 139 140   K 3.6 -- 3.6 3.7 3.9 3.7   CL 107 -- 107 108* 106 108*   CO2 24 -- 24 22 24 24    ALT -- -- -- -- -- --   AST -- -- -- -- -- --   BILITOTAL -- -- -- -- -- --   BILIDIRECT -- -- -- -- -- --   PROT -- -- -- -- -- --   LIP -- -- -- -- -- --   INR -- -- -- -- -- --   PTT -- -- -- -- -- --     No results found for this basename: LIP:5,AMY:5 in the last 168 hours    Rads:  Radiology Results (24 Hour)     ** No Results found for the last 24 hours. **          Assessment:   As previously documented, epigastric pain and n/v multifactorial.  Improved with current care.    Plan:   Advance diet as tolerated.  If tolerating solid food tomorrow, can discharge from GI standpoint.  Patient given my card to follow up with me as outpatient.  Discussed strategies to reduce risk of recurrent events prompting hospitalization including compliance with outpatient medications and controlling diabetes.  Patient has been experiencing difficulties affording out of pocket expenses for medications.  Recommended social work consult for programs to assist with medication costs.    Signed by: [name]

## 2012-06-25 DIAGNOSIS — N179 Acute kidney failure, unspecified: Secondary | ICD-10-CM

## 2012-06-25 LAB — POCT GLUCOSE: Whole Blood Glucose POCT: 177 mg/dL — AB (ref 70–100)

## 2012-06-25 MED ORDER — AMLODIPINE BESYLATE 5 MG PO TABS
5.0000 mg | ORAL_TABLET | Freq: Every day | ORAL | Status: DC
Start: 2012-06-25 — End: 2012-06-30

## 2012-06-25 MED ORDER — PANTOPRAZOLE SODIUM 40 MG PO TBEC
40.0000 mg | DELAYED_RELEASE_TABLET | Freq: Every day | ORAL | Status: DC
Start: 2012-06-25 — End: 2012-06-30

## 2012-06-25 MED ORDER — HYDROMORPHONE HCL 4 MG PO TABS
4.0000 mg | ORAL_TABLET | ORAL | Status: DC | PRN
Start: 2012-06-25 — End: 2012-06-25

## 2012-06-25 MED ORDER — SUCRALFATE 1 G PO TABS
1.0000 g | ORAL_TABLET | Freq: Three times a day (TID) | ORAL | Status: DC
Start: 2012-06-25 — End: 2012-06-30

## 2012-06-25 MED ORDER — INSULIN GLARGINE 100 UNIT/ML SC SOLN
36.0000 [IU] | Freq: Every evening | SUBCUTANEOUS | Status: DC
Start: 2012-06-25 — End: 2012-10-05

## 2012-06-25 MED ORDER — METOCLOPRAMIDE HCL 10 MG PO TABS
10.0000 mg | ORAL_TABLET | Freq: Three times a day (TID) | ORAL | Status: DC
Start: 2012-06-25 — End: 2012-06-30

## 2012-06-25 MED ORDER — INSULIN GLARGINE 100 UNIT/ML SC SOLN
35.0000 [IU] | Freq: Every evening | SUBCUTANEOUS | Status: DC
Start: 2012-06-25 — End: 2012-06-25

## 2012-06-25 MED ORDER — HYDROMORPHONE HCL 2 MG PO TABS
2.0000 mg | ORAL_TABLET | ORAL | Status: DC | PRN
Start: 2012-06-25 — End: 2012-06-25

## 2012-06-25 MED ORDER — ONDANSETRON 4 MG PO TBDP
4.0000 mg | ORAL_TABLET | Freq: Three times a day (TID) | ORAL | Status: DC | PRN
Start: 2012-06-25 — End: 2012-06-30

## 2012-06-25 MED ORDER — LISINOPRIL 40 MG PO TABS
40.00 mg | ORAL_TABLET | Freq: Every day | ORAL | Status: DC
Start: 2012-06-25 — End: 2012-06-30

## 2012-06-25 MED ORDER — INSULIN ASPART 100 UNIT/ML SC SOLN
1.0000 [IU] | Freq: Four times a day (QID) | SUBCUTANEOUS | Status: DC
Start: 2012-06-25 — End: 2012-10-05

## 2012-06-25 NOTE — Discharge Instructions (Signed)
Discharge Instructions        Follow with Primary MD [name], MD in 7 days     Get CBC, CMP, checked in 7 days by Primary MD and again as instructed by your Primary MD. Get Medicines reviewed and adjusted.    Please request your Prim.MD to go over all Hospital Tests and Procedure/Radiological results at the follow up, please get all Hospital records sent to your Prim MD by signing hospital release before you go home.    Follow with Dr [name], MD In 1 week [address]  [address] [place] [address]  [phone]      Activity: Fall precautions use walker/cane & assistance as needed      Do not drive when taking Pain medications.     Wear Seat belts while driving.    Diet: diabetic  Fluid restriction 1.8 lit/day, Aspiration precautions.      Disposition     If you experience worsening of your admission symptoms, develop shortness of breath, life threatening emergency, suicidal or homicidal thoughts you must seek medical attention immediately by calling 911 or calling your MD immediately  if symptoms less severe.    Do not take more than prescribed Pain, Sleep and Anxiety Medications    Special Instructions: If you have smoked or chewed Tobacco  in the last 2 yrs please stop smoking, stop any regular Alcohol  and or any Recreational drug use.    You Must read complete instructions/literature along with all the possible adverse reactions/side effects for all the Medicines you take and that have been prescribed to you. Take any new Medicines after you have completely understood and accepted all the possible adverse reactions/side effects.      [email or web address] Program    Education about New Medications and their Side effects    Dear [name],    Its been a pleasure taking care of you during your hospitalization here at [facility]. We have initiated a new program to educate our patients and/or their family members or designated personell about the new medications started by your  physicians and their indications along with the possible side effects. Multiple studies have shown that patients started on new medications are often unaware of the names of the medication along with the indications and their side effects which leads to decreased compliance with the medications.    During our conversation today on 06/25/2012  9:51 AM I have explained to you the name of the new medication started, why it is needed, and some possible side effects. During the teach back I was able to confirm your understanding of the information shared about this new medication.       Medication:  Amlodipine(Norvasc)   This Medication is used for:   High Blood Pressure   Angina     Common Side Effects are:   Headache   Dizziness   Nausea   Swelling     A note from your nurse:  Call your nurse immediately if you notice itching, hives, swelling or trouble breathing        Medication: Metoclopramide(Reglan)   This Medication is used for:   Nausea   Vomiting   Decreased motility of the stomach    Common Side Effects are:   Restlessness   Muscle Spasms   Constipation    A note from your nurse:  Call your nurse immediately if you notice itching, hives, swelling or trouble breathing  Sucralfate (Carafate) Protects stomach Constipation        Thank you for your time.    Anselmo Pickler, MD  06/25/2012  9:51 AM  [facility]  [address]  [place], [place]  [address]

## 2012-06-25 NOTE — Progress Notes (Signed)
[place] pt home, IV DCd tolerated, instructions and scripts given, no distress noted

## 2012-06-25 NOTE — Progress Notes (Signed)
PT RESING IN BED, DENIES PAIN. HR ELEVATED, BUT IN REVIEWING HISTORY, HR HAS BEEN ELEVATED. DID NOT NOTIFY DOCTOR BASED ON RECENT HISTORY

## 2012-06-25 NOTE — Progress Notes (Signed)
Request from hospitalist to transition to orals.  Patient denies pain and nausea at this time. States feels better.

## 2012-06-25 NOTE — Progress Notes (Signed)
PTS 5/13 2150 BP WAS 173/87 HR 101. WHEN REASSESSED AT 0008 BP WS 137/77  HR 103 WITHOUT MEDICATING. PT DENIES PAIN.

## 2012-06-25 NOTE — Progress Notes (Signed)
Met with pt to discuss issue with not being able to afford medications. Pt said he had a new insurance that had a high deductible. I gave him information about [employer], pt assistance programs. He said he should be ok now. Encouranged pt to take meds as ordered to present readmissions.

## 2012-06-25 NOTE — Plan of Care (Signed)
Ask3Teach3 Program    Education about New Medications and their Side effects    Dear [name],    Its been a pleasure taking care of you during your hospitalization here at [facility]. We have initiated a new program to educate our patients and/or their family members or designated personell about the new medications started by your physicians and their indications along with the possible side effects. Multiple studies have shown that patients started on new medications are often unaware of the names of the medication along with the indications and their side effects which leads to decreased compliance with the medications.    During our conversation today on 06/25/2012  9:59 AM I have explained to you the name of the new medication started, why it is needed, and some possible side effects. During the teach back I was able to confirm your understanding of the information shared about this new medication.       Medication Name: Lisinopril(Zestril)   This Medication is used for:   Blood Pressure   Congestive Heart Failure     Common Side Effects are:   Dizziness   Low Blood Pressure    Cough   Increased Potassium     A note from your Nurse:  Call your nurse immediately if you notice itching, hives, swelling or trouble breathing       Medication: Ondansetron(Zofran)   This Medication is used for:   Nausea   Vomiting    Common Side Effects are:   Dizziness   Headache   Constipation   Fatigue    A note from your nurse:  Call your nurse immediately if you notice itching, hives, swelling or trouble breathing       Thank you for your time.    [name] Ghazi, RN  06/25/2012  9:59 AM  [facility]  [address]  [place], [place]  [address]

## 2012-06-25 NOTE — Discharge Summary (Signed)
[facility] Hospitalist Discharge Note      Date Time: 06/25/2012  9:53 AM  Patient Name:[name]  ZOX:[id number]  PCP: [name], MD  Attending Physician:Lousie Calico Glenis Smoker M.D.    Hospital Course:   Please see H&P for complete details of HPI and ROS. The patient was admitted to [facility] and has been diagnosed with the following conditions and has been taken care as mentioned below.    Principal Problem:   *DKA (diabetic ketoacidoses)  Active Problems:   GERD (gastroesophageal reflux disease)   Gastroparesis   Dehydration, moderate   Gastritis without bleeding   AKI (acute kidney injury)    [name] is a 37 y.o. male IDDM, h/o DKA, gastroparesis, gastritis, GERD who presents to the hospital abdominal pain and vomiting since this morning. He was found to be in DKA. He received iv fluids and insulin gtt.   1. Intractable Nausea/Vomiting- likely multifactorial:narcotics/gastroparesis/ history of significant gastritis   - seen by  GI; started ppi and sucralfate with reglan ; outpt f/u   -CT abdomen unremarkable   -consider a trial with erythromycin if no improvement   2. Diabetes Mellitus/ s/p DKA- A1c 9.9   -Improved glucose levels   - increase lantus to 36 units SubQ daily ; SSI   -f/u with pcp closely  3. Abdominal pain post vomiting   -PPI bid   4. HTN uncontrolled   -started on on norvasc and increased dose of  lisinopril      Type of Admission:inpt  Medical Necessity for stay:DKA, gastroparesis    Date of Admission:   06/17/2012    Date of Discharge:   06/25/2012    Chief Complaint:   Nausea vomitting  Reason for Admission:   DKA (diabetic ketoacidoses) [250.10]  Acute renal failure [584.9]  Dehydration, severe [276.51]  Nausea and vomiting [787.01]  93956DKA (diabetic ketoacidoses)93956  93956DKA (diabetic ketoacidoses)93956    Discharge Diagnosis:   Hospital Problems:  Principal Problem:   *DKA (diabetic ketoacidoses)  Active Problems:   GERD (gastroesophageal reflux disease)    Gastroparesis   Dehydration, moderate   Gastritis without bleeding   AKI (acute kidney injury)      Lists the present on admission hospital problems  Present on Admission:   . DKA (diabetic ketoacidoses)  . GERD (gastroesophageal reflux disease)  . Gastroparesis  . Dehydration, moderate  . Gastritis without bleeding  . AKI (acute kidney injury)        Consult Input/Plan     GI  IP CONSULT TO NUTRITION SERVICES  IP CONSULT TO SOCIAL WORK        Physical Exam:    height is 1.905 m (6\' 3" ) and weight is 127.1 kg (280 lb 3.3 oz). His temporal artery temperature is 97.2 F (36.2 C). His blood pressure is 128/59 and his pulse is 81. His respiration is 16 and oxygen saturation is 97%.   Body mass index is 35.02 kg/(m^2).  Filed Vitals:    06/24/12 2150 06/25/12 0008 06/25/12 0108 06/25/12 0555   BP: 173/87 137/77 136/78 128/59   Pulse: 101 103 101 81   Temp: 98 F (36.7 C)  98.9 F (37.2 C) 97.2 F (36.2 C)   TempSrc: Temporal Artery  Oral Temporal Artery   Resp: 18  16 16    Height:       Weight:       SpO2: 98% 96% 97% 97%     Intake and Output Summary (Last 24 hours) at  Date Time    Intake/Output Summary (Last 24 hours) at 06/25/12 0953  Last data filed at 06/25/12 0700   Gross per 24 hour   Intake   2747 ml   Output   1150 ml   Net   1597 ml         Labs:     Results     Procedure Component Value Units Date/Time    POCT glucose (AC and HS) [[id number]]  (Abnormal) Collected:06/25/12 0737     POCT Glucose WB 177 (A) mg/dL [id number]:29/56/21 3086    POCT glucose (AC and HS) [[id number]]  (Abnormal) Collected:06/24/12 2200     POCT Glucose WB 204 (A) mg/dL BMWUXLK:44/01/02 7253    POCT Glucose [[id number]]  (Abnormal) Collected:06/24/12 1653     POCT Glucose WB 170 (A) mg/dL QVZDGLO:75/64/33 2951    POCT Glucose [[id number]]  (Abnormal) Collected:06/24/12 1217     POCT Glucose WB 162 (A) mg/dL KZSWFUX:32/35/57 3220            Rads:   Radiological Procedure reviewed.  Ct Abdomen Pelvis W Iv And Po Cont    06/22/2012  EXAM: CT  OF THE ABDOMEN AND PELVIS  TECHNIQUE: 5-mm axial CT images of the abdomen and pelvis were obtained  following bolus administration of nonionic intravenous contrast. Patient was administered a total of 125 mL of Visipaque contrast. Oral contrast was also administered.  HISTORY:  Nausea and vomiting  FINDINGS: The large and small bowel demonstrate normal caliber. There is no evidence of obstruction or ileus. No pathologic bowel wall thickening appreciated. There is mild thickening/fluid and infiltration of the left paracolic gutter of uncertain etiology. No free intraperitoneal air is seen. There is no mesenteric or pelvic adenopathy.  The pancreas, spleen, kidneys, adrenal glands are unremarkable. The gallbladder surgically absent. There are 2 punctate subcentimeter low-attenuation lesions within the hepatic dome region which are too small to characterize on this study. No other liver lesions are seen. The bladder and prostate are also unremarkable.  Bone window images are unremarkable.   Minimal dependent atelectatic changes noted at the left lung base.      06/22/2012   1. Unremarkable computed tomography examination of the abdomen and pelvis without imaging correlate for patient's presenting pain symptoms.  Sable Feil, MD  06/22/2012 10:04 PM     Xr Abdomen 2 View With Chest 1 View    06/21/2012  CLINICAL HISTORY: Nausea and vomiting.  FINDINGS: PA view of the chest and supine/upright views of the abdomen. Compared with prior studies including 06/17/2012.  Central silhouette and pulmonary vascularity are normal. No focal consolidation or free intraperitoneal air.  Staples in the right upper quadrant. Small-to-moderate volume of gas and stool in portions of the colon. Small volume of gas in portions of small bowel, particularly in the right upper quadrant. No dilated loops of bowel or abnormal calcifications.      06/21/2012   Unremarkable bowel gas pattern.  Darra Lis, MD  06/21/2012 4:23 PM     Abdomen 2  View With Chest 1 View    06/17/2012  HISTORY: Gastroparesis, nausea, vomiting, abdominal pain.    COMPARISON: 01/10/2011 chest radiograph. 01/11/2011 abdominal radiographs.  Portable chest radiograph. The lung volumes are low. The heart is top normal size accounting for the low lung volumes. The mediastinal contour is normal. There is no pneumothorax or pleural effusion. The lungs are clear. The thoracic osseous structures appear intact.  Supine and erect abdominal radiographs were obtained. A  mildly dilated small bowel loop is noted on the upright radiograph in the right lower abdomen. There is a relative paucity of small bowel gas in the central abdomen. Mild stool is noted throughout the colon. No air-fluid levels are noted. No suspicious soft tissue calcification, pneumatosis, or pneumoperitoneum is evident.Surgical clips are noted in the right upper abdomen.      06/17/2012     1. Low lung volumes with clear lungs.  2. Mildly dilated small bowel loop in the right lower abdomen. Relative possibly of small bowel gas. No air-fluid levels. A mild distal small bowel obstruction cannot be excluded.  Finding were called to and acknowledged by [name], MD on 06/17/2012 6:28 PM.   Cleone Slim, MD  06/17/2012 6:29 PM       Discharge Medications:     Please See Discharge Medication reconciliation for the final list of medications.      Discharge Destination:   home  Condition at Discharge :   Improved    Time spent for Discharge Care:   40 }minutes    Follow-up:   [name], MD In 1 week [address]  [address]  [place] [address]  [phone]      Recommended Follow up with PCP in one week.    Signed by: Anselmo Pickler, MD

## 2012-06-26 ENCOUNTER — Inpatient Hospital Stay: Payer: BC Managed Care – PPO | Admitting: Family Medicine

## 2012-06-26 ENCOUNTER — Inpatient Hospital Stay
Admission: EM | Admit: 2012-06-26 | Discharge: 2012-06-30 | DRG: 074 | Disposition: A | Discharge status: Home / Self Care | Payer: BC Managed Care – PPO | Attending: Family Medicine | Admitting: Family Medicine

## 2012-06-26 DIAGNOSIS — K3184 Gastroparesis: Secondary | ICD-10-CM

## 2012-06-26 DIAGNOSIS — R112 Nausea with vomiting, unspecified: Secondary | ICD-10-CM | POA: Diagnosis present

## 2012-06-26 DIAGNOSIS — R109 Unspecified abdominal pain: Secondary | ICD-10-CM | POA: Diagnosis present

## 2012-06-26 DIAGNOSIS — IMO0002 Reserved for concepts with insufficient information to code with codable children: Secondary | ICD-10-CM | POA: Diagnosis present

## 2012-06-26 DIAGNOSIS — E1149 Type 2 diabetes mellitus with other diabetic neurological complication: Principal | ICD-10-CM | POA: Diagnosis present

## 2012-06-26 DIAGNOSIS — G8929 Other chronic pain: Secondary | ICD-10-CM | POA: Diagnosis present

## 2012-06-26 DIAGNOSIS — R6250 Unspecified lack of expected normal physiological development in childhood: Secondary | ICD-10-CM | POA: Diagnosis present

## 2012-06-26 DIAGNOSIS — Z9119 Patient's noncompliance with other medical treatment and regimen: Secondary | ICD-10-CM

## 2012-06-26 DIAGNOSIS — K219 Gastro-esophageal reflux disease without esophagitis: Secondary | ICD-10-CM | POA: Diagnosis present

## 2012-06-26 DIAGNOSIS — Z794 Long term (current) use of insulin: Secondary | ICD-10-CM

## 2012-06-26 DIAGNOSIS — E860 Dehydration: Secondary | ICD-10-CM | POA: Diagnosis not present

## 2012-06-26 DIAGNOSIS — Z91199 Patient's noncompliance with other medical treatment and regimen due to unspecified reason: Secondary | ICD-10-CM

## 2012-06-26 DIAGNOSIS — E1142 Type 2 diabetes mellitus with diabetic polyneuropathy: Secondary | ICD-10-CM | POA: Diagnosis present

## 2012-06-26 DIAGNOSIS — I10 Essential (primary) hypertension: Secondary | ICD-10-CM | POA: Diagnosis present

## 2012-06-26 LAB — I-STAT CG4 VENOUS CARTRIDGE
Lactic Acid I-Stat: 2.2 (ref 0.5–2.2)
i-STAT Base Excess Venous: 4
i-STAT FIO2: 21
i-STAT HCO3 Bicarbonate Venous: 27.7
i-STAT O2 Saturation Venous: 91 %
i-STAT Patient Temperature: 98.2
i-STAT Total CO2 Venous: 29
i-STAT pCO2 Venous: 37.1
i-STAT pH Venous: 7.48
i-STAT pO2 Venous: 55

## 2012-06-26 LAB — CBC AND DIFFERENTIAL
Basophils Absolute Automated: 0.01 (ref 0.00–0.20)
Basophils Automated: 0 %
Eosinophils Absolute Automated: 0.09 (ref 0.00–0.70)
Eosinophils Automated: 1 %
Hematocrit: 43.9 % (ref 42.0–52.0)
Hgb: 14.9 g/dL (ref 13.0–17.0)
Immature Granulocytes Absolute: 0.03
Immature Granulocytes: 0 %
Lymphocytes Absolute Automated: 1.86 (ref 0.50–4.40)
Lymphocytes Automated: 25 %
MCH: 26.9 pg — ABNORMAL LOW (ref 28.0–32.0)
MCHC: 33.9 g/dL (ref 32.0–36.0)
MCV: 79.2 fL — ABNORMAL LOW (ref 80.0–100.0)
MPV: 8.9 fL — ABNORMAL LOW (ref 9.4–12.3)
Monocytes Absolute Automated: 0.59 (ref 0.00–1.20)
Monocytes: 8 %
Neutrophils Absolute: 4.81 (ref 1.80–8.10)
Neutrophils: 65 %
Nucleated RBC: 0 (ref 0–1)
Platelets: 314 (ref 140–400)
RBC: 5.54 (ref 4.70–6.00)
RDW: 13 % (ref 12–15)
WBC: 7.39 (ref 3.50–10.80)

## 2012-06-26 LAB — COMPREHENSIVE METABOLIC PANEL
ALT: 38 U/L (ref 0–55)
AST (SGOT): 33 U/L (ref 5–34)
Albumin/Globulin Ratio: 1.2 (ref 0.9–2.2)
Albumin: 3.9 g/dL (ref 3.5–5.0)
Alkaline Phosphatase: 88 U/L (ref 40–150)
BUN: 10 mg/dL (ref 9.0–21.0)
Bilirubin, Total: 1.2 mg/dL (ref 0.2–1.2)
CO2: 25 (ref 22–29)
Calcium: 9.2 mg/dL (ref 8.5–10.5)
Chloride: 104 (ref 98–107)
Creatinine: 1.2 mg/dL (ref 0.7–1.3)
Globulin: 3.3 g/dL (ref 2.0–3.6)
Glucose: 192 mg/dL — ABNORMAL HIGH (ref 70–100)
Potassium: 3.4 — ABNORMAL LOW (ref 3.5–5.1)
Protein, Total: 7.2 g/dL (ref 6.0–8.3)
Sodium: 141 (ref 136–145)

## 2012-06-26 LAB — URINALYSIS WITH MICROSCOPIC
Bilirubin, UA: NEGATIVE
Blood, UA: NEGATIVE
Glucose, UA: >1000 — AB
Ketones UA: NEGATIVE
Leukocyte Esterase, UA: NEGATIVE
Nitrite, UA: NEGATIVE
Protein, UR: NEGATIVE
Specific Gravity UA: 1.024 (ref 1.001–1.035)
Urine pH: 6 (ref 5.0–8.0)
Urobilinogen, UA: NORMAL mg/dL

## 2012-06-26 LAB — GFR: EGFR: >60

## 2012-06-26 LAB — POCT GLUCOSE: Whole Blood Glucose POCT: 189 mg/dL — AB (ref 70–100)

## 2012-06-26 LAB — LIPASE: Lipase: 76 U/L (ref 8–78)

## 2012-06-26 MED ORDER — HYDROMORPHONE HCL PF 1 MG/ML IJ SOLN
1.0000 mg | Freq: Once | INTRAMUSCULAR | Status: AC
Start: 2012-06-26 — End: 2012-06-26
  Administered 2012-06-26: 1 mg via INTRAVENOUS
  Filled 2012-06-26: qty 1

## 2012-06-26 MED ORDER — ONDANSETRON HCL 4 MG/2ML IJ SOLN
4.0000 mg | Freq: Once | INTRAMUSCULAR | Status: AC
Start: 2012-06-26 — End: 2012-06-26
  Administered 2012-06-26: 4 mg via INTRAVENOUS
  Filled 2012-06-26: qty 2

## 2012-06-26 MED ORDER — SODIUM CHLORIDE 0.9 % IV BOLUS
1000.0000 mL | Freq: Once | INTRAVENOUS | Status: AC
Start: 2012-06-26 — End: 2012-06-27
  Administered 2012-06-26: 1000 mL via INTRAVENOUS

## 2012-06-26 MED ORDER — PROMETHAZINE HCL 25 MG/ML IJ SOLN
12.5000 mg | Freq: Once | INTRAMUSCULAR | Status: AC
Start: 2012-06-26 — End: 2012-06-26
  Administered 2012-06-26: 12.5 mg via INTRAVENOUS
  Filled 2012-06-26: qty 1

## 2012-06-26 MED ORDER — SODIUM CHLORIDE 0.9 % IV BOLUS
1000.0000 mL | Freq: Once | INTRAVENOUS | Status: AC
Start: 2012-06-26 — End: 2012-06-26
  Administered 2012-06-26: 1000 mL via INTRAVENOUS

## 2012-06-26 NOTE — ED Provider Notes (Signed)
Physician/Midlevel provider first contact with patient: 06/26/12 1903         History     Chief Complaint   Patient presents with   . Abdominal Pain   . Emesis     Patient is a 37 y.o. male presenting with abdominal pain and vomiting. The history is provided by the patient. The history is limited by a developmental delay.   Abdominal Pain  The primary symptoms of the illness include abdominal pain and vomiting. The primary symptoms of the illness do not include fever.   Additional symptoms associated with the illness include chills. Symptoms associated with the illness do not include frequency.   Emesis   Associated symptoms include abdominal pain and chills. Pertinent negatives include no fever.   Pt is a 37 yo M w\PMH DM, gastroparesis, HTN who p\w abd pain n\v s\p [place] from hospital yesterday for DKA. Pt reports not taking his rx s\p [place] yesterday. Pt endorses taking his insulin at home. Pt endorses crampy abdominal pain, NBNB emesis, notes GB has been removed. Pt denies f\c, denies increase in urinary frequency. Pt endorses flatus    Past Medical History   Diagnosis Date   . Diabetes    . Hypertensive disorder    . Gastroparesis        Past Surgical History   Procedure Date   . Cholecystectomy        No family history on file.    Social  History   Substance Use Topics   . Smoking status: Never Smoker    . Smokeless tobacco: Never Used   . Alcohol Use: No       .     No Known Allergies    Current/Home Medications    AMLODIPINE (NORVASC) 5 MG TABLET    Take 1 tablet (5 mg total) by mouth daily.    INSULIN ASPART (NOVOLOG) 100 UNIT/ML INJECTION    Inject 1-8 Units into the skin 4 times daily - with meals and at bedtime.    INSULIN GLARGINE (LANTUS) 100 UNIT/ML INJECTION    Inject 36 Units into the skin nightly.    LISINOPRIL (PRINIVIL,ZESTRIL) 20 MG TABLET    Take 20 mg by mouth daily.    LISINOPRIL (PRINIVIL,ZESTRIL) 40 MG TABLET    Take 1 tablet (40 mg total) by mouth daily.    METOCLOPRAMIDE (REGLAN) 10 MG TABLET     Take 1 tablet (10 mg total) by mouth 3 (three) times daily.    ONDANSETRON (ZOFRAN-ODT) 4 MG DISINTEGRATING TABLET    Take 1 tablet (4 mg total) by mouth every 8 (eight) hours as needed for Nausea.    PANTOPRAZOLE (PROTONIX) 40 MG TABLET    Take 1 tablet (40 mg total) by mouth daily.    SUCRALFATE (CARAFATE) 1 G TABLET    Take 1 tablet (1 g total) by mouth 3 (three) times daily.        Review of Systems   Constitutional: Positive for chills. Negative for fever.   Gastrointestinal: Positive for vomiting and abdominal pain. Negative for abdominal distention.   Genitourinary: Negative for frequency.   All other systems reviewed and are negative.        Physical Exam    BP 165/97  Pulse 90  Temp 98.2 F (36.8 C)  Resp 18  Ht 1.905 m  Wt 126.1 kg  BMI 34.75 kg/m2  SpO2 98%    Physical Exam   Constitutional: He is oriented to person, place, and  time. He appears well-developed and well-nourished. No distress.   HENT:   Head: Normocephalic and atraumatic.   Eyes: Conjunctivae normal and EOM are normal. Pupils are equal, round, and reactive to light.   Neck: Normal range of motion. Neck supple.   Cardiovascular: Normal rate, regular rhythm and normal heart sounds.    Pulmonary/Chest: Effort normal and breath sounds normal.   Abdominal: Soft. He exhibits no distension. There is no tenderness. There is no guarding.        hypoactive BS   Musculoskeletal: Normal range of motion.   Neurological: He is alert and oriented to person, place, and time.   Skin: Skin is warm and dry.   Psychiatric: He has a normal mood and affect. His behavior is normal. Judgment and thought content normal.       MDM and ED Course     ED Medication Orders      Start     Status Ordering Provider    06/26/12 2309   promethazine (PHENERGAN) injection 12.5 mg   Once      Route: Intravenous  Ordered Dose: 12.5 mg         Last MAR action:  Given Mariane Duval B    06/26/12 2309   sodium chloride 0.9 % bolus 1,000 mL   Once      Route:  Intravenous  Ordered Dose: 1,000 mL         Last MAR action:  New Bag Mariane Duval B    06/26/12 2013   HYDROmorphone (DILAUDID) injection 1 mg   Once      Route: Intravenous  Ordered Dose: 1 mg         Last MAR action:  Given Joanette Gula    06/26/12 2013   promethazine (PHENERGAN) injection 12.5 mg   Once      Route: Intravenous  Ordered Dose: 12.5 mg         Last MAR action:  Given Joanette Gula    06/26/12 1933   ondansetron (ZOFRAN) injection 4 mg   Once      Route: Intravenous  Ordered Dose: 4 mg         Last MAR action:  Given Joanette Gula    06/26/12 1926   HYDROmorphone (DILAUDID) injection 1 mg   Once      Route: Intravenous  Ordered Dose: 1 mg         Last MAR action:  Given Joanette Gula    06/26/12 1841   ondansetron (ZOFRAN) injection 4 mg   Once      Route: Intravenous  Ordered Dose: 4 mg         Last MAR action:  Given WOODS, AMIE HALL    06/26/12 1841   sodium chloride 0.9 % bolus 1,000 mL   Once      Route: Intravenous  Ordered Dose: 1,000 mL         Last MAR action:  Stopped WOODS, AMIE HALL                 MDM  Number of Diagnoses or Management Options  Diagnosis management comments: Concern for DKA vs gastroparesis. Less concerned for obstruction given + flatus. Labs and re-eval.        Procedures    Clinical Impression & Disposition     Clinical Impression  Final diagnoses:   Gastroparesis        ED Disposition     Admit Bed Type: General [8]  Admitting Physician: Waylan Rocher [id number]  Patient Class: Hospital Outpatient Surgery (Amb Proc) [106]             New Prescriptions    No medications on file               Joanette Gula, MD  Resident  06/26/12 Rich Reining, MD  Resident  06/26/12 [phone]

## 2012-06-26 NOTE — ED Notes (Signed)
Pt c/o upper abd pain and vomiting starting a few hours ago. Pt states he was discharged yesterday from [facility] for gastroparesis and ketoacidosis. Pt with subjective fevers and chills. +dizziness. Pt was prescribed zofran however he did not have a chance to fill the prescriptions. States he noticed his blood sugar this afternoon was 370.

## 2012-06-27 DIAGNOSIS — IMO0002 Reserved for concepts with insufficient information to code with codable children: Secondary | ICD-10-CM | POA: Diagnosis present

## 2012-06-27 DIAGNOSIS — R112 Nausea with vomiting, unspecified: Secondary | ICD-10-CM | POA: Diagnosis present

## 2012-06-27 LAB — ECG 12-LEAD
Atrial Rate: 83 {beats}/min
Atrial Rate: 91 {beats}/min
P Axis: 58 degrees
P Axis: 55 degrees
P-R Interval: 178 ms
P-R Interval: 178 ms
Q-T Interval: 368 ms
Q-T Interval: 356 ms
QRS Duration: 86 ms
QRS Duration: 86 ms
QTC Calculation (Bezet): 432 ms
QTC Calculation (Bezet): 437 ms
R Axis: 52 degrees
R Axis: 42 degrees
T Axis: -82 degrees
T Axis: 39 degrees
Ventricular Rate: 83 {beats}/min
Ventricular Rate: 91 {beats}/min

## 2012-06-27 LAB — POCT GLUCOSE
Whole Blood Glucose POCT: 175 mg/dL — AB (ref 70–100)
Whole Blood Glucose POCT: 177 mg/dL — AB (ref 70–100)
Whole Blood Glucose POCT: 162 mg/dL — AB (ref 70–100)
Whole Blood Glucose POCT: 173 mg/dL — AB (ref 70–100)

## 2012-06-27 LAB — TROPONIN I
Troponin I: 0.01 ng/mL (ref 0.00–0.09)
Troponin I: 0.02 ng/mL (ref 0.00–0.09)

## 2012-06-27 LAB — CBC
Hematocrit: 43.1 % (ref 42.0–52.0)
Hgb: 14.5 g/dL (ref 13.0–17.0)
MCH: 26.7 pg — ABNORMAL LOW (ref 28.0–32.0)
MCHC: 33.6 g/dL (ref 32.0–36.0)
MCV: 79.4 fL — ABNORMAL LOW (ref 80.0–100.0)
MPV: 9.4 fL (ref 9.4–12.3)
Nucleated RBC: 0 (ref 0–1)
Platelets: 277 (ref 140–400)
RBC: 5.43 (ref 4.70–6.00)
RDW: 13 % (ref 12–15)
WBC: 7.77 (ref 3.50–10.80)

## 2012-06-27 MED ORDER — ENALAPRILAT 1.25 MG/ML IV INJ
1.25 mg | INJECTION | Freq: Once | INTRAVENOUS | Status: AC
Start: 2012-06-27 — End: 2012-06-27
  Administered 2012-06-27: 1.25 mg via INTRAVENOUS
  Filled 2012-06-27: qty 1

## 2012-06-27 MED ORDER — AMLODIPINE BESYLATE 5 MG PO TABS
5.00 mg | ORAL_TABLET | Freq: Every day | ORAL | Status: DC
Start: 2012-06-27 — End: 2012-06-30
  Administered 2012-06-30: 5 mg via ORAL
  Filled 2012-06-27 (×2): qty 1

## 2012-06-27 MED ORDER — ENALAPRILAT 1.25 MG/ML IV INJ
1.2500 mg | INJECTION | Freq: Four times a day (QID) | INTRAVENOUS | Status: DC
Start: 2012-06-27 — End: 2012-06-27
  Administered 2012-06-27 (×2): 1.25 mg via INTRAVENOUS
  Filled 2012-06-27 (×5): qty 1

## 2012-06-27 MED ORDER — ENOXAPARIN SODIUM 40 MG/0.4ML SC SOLN
40.0000 mg | Freq: Every day | SUBCUTANEOUS | Status: DC
Start: 2012-06-27 — End: 2012-06-30
  Administered 2012-06-28 – 2012-06-30 (×3): 40 mg via SUBCUTANEOUS
  Filled 2012-06-27 (×4): qty 0.4

## 2012-06-27 MED ORDER — KETOROLAC TROMETHAMINE 30 MG/ML IJ SOLN
30.0000 mg | Freq: Four times a day (QID) | INTRAMUSCULAR | Status: AC | PRN
Start: 2012-06-27 — End: 2012-06-30
  Administered 2012-06-27 – 2012-06-30 (×3): 30 mg via INTRAVENOUS
  Filled 2012-06-27 (×3): qty 1

## 2012-06-27 MED ORDER — GLUCAGON HCL (RDNA) 1 MG IJ SOLR
1.0000 mg | INTRAMUSCULAR | Status: DC | PRN
Start: 2012-06-27 — End: 2012-06-30

## 2012-06-27 MED ORDER — ONDANSETRON HCL 4 MG/2ML IJ SOLN
4.00 mg | Freq: Three times a day (TID) | INTRAMUSCULAR | Status: DC | PRN
Start: 2012-06-27 — End: 2012-06-27
  Administered 2012-06-27: 4 mg via INTRAVENOUS
  Filled 2012-06-27: qty 2

## 2012-06-27 MED ORDER — KETOROLAC TROMETHAMINE 30 MG/ML IJ SOLN
30.0000 mg | Freq: Once | INTRAMUSCULAR | Status: AC
Start: 2012-06-27 — End: 2012-06-27
  Administered 2012-06-27: 30 mg via INTRAVENOUS
  Filled 2012-06-27: qty 1

## 2012-06-27 MED ORDER — KETOROLAC TROMETHAMINE 30 MG/ML IJ SOLN
30.00 mg | Freq: Four times a day (QID) | INTRAMUSCULAR | Status: DC | PRN
Start: 2012-06-27 — End: 2012-06-27
  Administered 2012-06-27: 30 mg via INTRAVENOUS
  Filled 2012-06-27: qty 1

## 2012-06-27 MED ORDER — ONDANSETRON HCL 4 MG/2ML IJ SOLN
8.0000 mg | Freq: Three times a day (TID) | INTRAMUSCULAR | Status: AC | PRN
Start: 2012-06-27 — End: 2012-06-30
  Administered 2012-06-27 – 2012-06-29 (×3): 8 mg via INTRAVENOUS
  Filled 2012-06-27 (×3): qty 4

## 2012-06-27 MED ORDER — HYDROMORPHONE HCL PF 1 MG/ML IJ SOLN
1.0000 mg | Freq: Once | INTRAMUSCULAR | Status: AC
Start: 2012-06-27 — End: 2012-06-27
  Administered 2012-06-27: 1 mg via INTRAVENOUS
  Filled 2012-06-27: qty 1

## 2012-06-27 MED ORDER — PROMETHAZINE HCL 25 MG/ML IJ SOLN
12.5000 mg | Freq: Four times a day (QID) | INTRAMUSCULAR | Status: DC | PRN
Start: 2012-06-27 — End: 2012-06-27
  Administered 2012-06-27: 12.5 mg via INTRAVENOUS
  Filled 2012-06-27: qty 1

## 2012-06-27 MED ORDER — POTASSIUM CHLORIDE IN NACL 20-0.9 MEQ/L-% IV SOLN
INTRAVENOUS | Status: DC
Start: 2012-06-27 — End: 2012-06-30
  Administered 2012-06-28 – 2012-06-30 (×2): 100 mL/h via INTRAVENOUS

## 2012-06-27 MED ORDER — ONDANSETRON HCL 4 MG/2ML IJ SOLN
4.00 mg | Freq: Four times a day (QID) | INTRAMUSCULAR | Status: DC | PRN
Start: 2012-06-27 — End: 2012-06-27
  Administered 2012-06-27: 4 mg via INTRAVENOUS
  Filled 2012-06-27 (×2): qty 2

## 2012-06-27 MED ORDER — SUCRALFATE 1 G PO TABS
1.0000 g | ORAL_TABLET | Freq: Three times a day (TID) | ORAL | Status: DC
Start: 2012-06-27 — End: 2012-06-30
  Administered 2012-06-29 – 2012-06-30 (×3): 1 g via ORAL
  Filled 2012-06-27 (×5): qty 1

## 2012-06-27 MED ORDER — INSULIN ASPART 100 UNIT/ML SC SOLN
1.0000 [IU] | SUBCUTANEOUS | Status: DC | PRN
Start: 2012-06-27 — End: 2012-06-30
  Administered 2012-06-28 – 2012-06-29 (×3): 1 [IU] via SUBCUTANEOUS
  Filled 2012-06-27: qty 10

## 2012-06-27 MED ORDER — SODIUM CHLORIDE 0.9 % IV SOLN
500.00 mg | Freq: Three times a day (TID) | INTRAVENOUS | Status: AC
Start: 2012-06-27 — End: 2012-06-28
  Administered 2012-06-27 (×2): 500 mg via INTRAVENOUS
  Filled 2012-06-27 (×3): qty 500

## 2012-06-27 MED ORDER — METOCLOPRAMIDE HCL 5 MG/ML IJ SOLN
10.00 mg | Freq: Four times a day (QID) | INTRAMUSCULAR | Status: DC
Start: 2012-06-28 — End: 2012-06-27

## 2012-06-27 MED ORDER — INSULIN GLARGINE 100 UNIT/ML SC SOLN
25.00 [IU] | Freq: Every evening | SUBCUTANEOUS | Status: DC
Start: 2012-06-27 — End: 2012-06-28
  Administered 2012-06-27: 25 [IU] via SUBCUTANEOUS

## 2012-06-27 MED ORDER — LISINOPRIL 20 MG PO TABS
40.0000 mg | ORAL_TABLET | Freq: Every day | ORAL | Status: DC
Start: 2012-06-27 — End: 2012-06-27

## 2012-06-27 MED ORDER — PANTOPRAZOLE SODIUM 40 MG IV SOLR
40.0000 mg | Freq: Every day | INTRAVENOUS | Status: DC
Start: 2012-06-27 — End: 2012-06-29
  Administered 2012-06-27 – 2012-06-29 (×3): 40 mg via INTRAVENOUS
  Filled 2012-06-27 (×3): qty 40

## 2012-06-27 MED ORDER — GLUCOSE 40 % PO GEL
15.0000 g | ORAL | Status: DC | PRN
Start: 2012-06-27 — End: 2012-06-30

## 2012-06-27 MED ORDER — KETOROLAC TROMETHAMINE 30 MG/ML IJ SOLN
30.00 mg | Freq: Once | INTRAMUSCULAR | Status: AC
Start: 2012-06-27 — End: 2012-06-27
  Administered 2012-06-27: 30 mg via INTRAVENOUS
  Filled 2012-06-27: qty 1

## 2012-06-27 MED ORDER — ENALAPRILAT 1.25 MG/ML IV INJ
2.5000 mg | INJECTION | Freq: Four times a day (QID) | INTRAVENOUS | Status: DC
Start: 2012-06-27 — End: 2012-06-29
  Administered 2012-06-27 – 2012-06-29 (×7): 2.5 mg via INTRAVENOUS
  Filled 2012-06-27 (×11): qty 2

## 2012-06-27 MED ORDER — DEXTROSE 50 % IV SOLN
25.00 mL | INTRAVENOUS | Status: DC | PRN
Start: 2012-06-27 — End: 2012-06-30

## 2012-06-27 MED ORDER — SCOPOLAMINE 1 MG/3DAYS TD PT72
1.0000 | MEDICATED_PATCH | TRANSDERMAL | Status: DC
Start: 2012-06-27 — End: 2012-06-30
  Administered 2012-06-27: 1 via TRANSDERMAL
  Filled 2012-06-27 (×2): qty 1

## 2012-06-27 MED ORDER — PROMETHAZINE HCL 25 MG/ML IJ SOLN
25.00 mg | Freq: Four times a day (QID) | INTRAMUSCULAR | Status: DC | PRN
Start: 2012-06-27 — End: 2012-06-30
  Administered 2012-06-27 – 2012-06-28 (×4): 25 mg via INTRAVENOUS
  Filled 2012-06-27 (×6): qty 1

## 2012-06-27 MED ORDER — METOCLOPRAMIDE HCL 5 MG/ML IJ SOLN
10.0000 mg | Freq: Four times a day (QID) | INTRAMUSCULAR | Status: DC
Start: 2012-06-27 — End: 2012-06-30
  Administered 2012-06-27 – 2012-06-30 (×13): 10 mg via INTRAVENOUS
  Filled 2012-06-27 (×13): qty 2

## 2012-06-27 MED ORDER — LABETALOL HCL 5 MG/ML IV SOLN
20.00 mg | Freq: Once | INTRAVENOUS | Status: AC
Start: 2012-06-27 — End: 2012-06-27
  Administered 2012-06-27: 20 mg via INTRAVENOUS
  Filled 2012-06-27: qty 4

## 2012-06-27 NOTE — H&P (Signed)
ADMISSION HISTORY AND PHYSICAL EXAM    Date Time: 06/27/2012 12:41 AM  Patient Name: [name]  Attending Physician:Sughrue, Maura     Assessment:   36yo M w/ chronic abd pain, gastroparesis, uncontrolled DM2 here w/ nv unable to tolerate PO one day after discharge from [facility].  VSS, though hypertensive, and blood sugars <200 with no laboratory evidence of dehydration or acidosis.    Plan:   N/v:  -lipase normal, normal WBC, s/p lap chole  -NPO, per nutrition/GI recs from [place], would advance to non dairy puree when ready for PO trial  -will run a little IVF to replete K.  Pt's labs normal on admit and hypertensive s/p 2L NS bolus in ED, so will not run at full maintenance.  -Avoid narcotics, discussed this with patient   -restart prior regimen of reglan 10mg  and carafate 4xdaily when able to tolerate  -zofran, phenergan prn nausea    HTN:   -ordered vasotec as unable to tolerate po  -would add on labetalol if not able to advance to sips and need add'l control  -restart home lisinopril 40mg  and hctz 25mg  once able to po, was also on norvasc at [facility]  -EKG w/ some TWI and hypertensive to 220s, will check troponins and repeat EKG    DM2:  -will give 25units of lantus tonight, d/w patient  -SSI for now, appropriate given h/o uncontrolled glucose and npo status  -accuchecks q6h while npo    Renal:  -recent AKI at [facility], will repeat bmp in am    PPX:   -lovenox, protonix IV    Code: FULL  Dispo: anticipate stay time <2 midnights, will admit to observation.  Patient would benefit from care coordination if available to him.  Recommend CM assist in obtaining medications (ideally delivered to bedside prior to discharge to optimize compliance).    History of Present Illness:   [name] is a 37 y.o. male h/o HTN, gastritis, DM2 on insulin, poorly controlled, with b/l LE neuropathy and chronic gastroparesis.  His gastroparesis was previously well controlled with reglan and sucralfate four times daily, however due to not  taking these medications and not taking his insulin he has had at least 3 admissions since Oct 2013 for n/v, most recently presenting to [facility] with DKA.  At that time gastroenterology was consulted and advised avoiding all narcotics and dietary modification to improve gastric emptying.  He was gradually advanced to a pureed, dairy free diet and tolerated PO, though the patient reported he was having persistent abd pain, nausea, and loose stools during that hospitalization and this continued after discharge.  He was asked to continue reglan, protonix, and carafate on discharge; however patient reports he was unable to take these due to cost of medications.  Within 24 hours of discharge was unable to tolerate any PO and so presented to [facility] ED.  Emesis NBNB.     [name] typically takes 32-36 units of lantus QHS and SSI novolog.  He did take his novolog am and midday today, though didn't keep lunch down.  He has not taken any of his other medications since discharge 5/14.    PCP: Dr. Alben Spittle, last seen in Oct 2013, at which time had run out of lantus and so was not taking this.    Addendum: notes at [facility] indicated concern for narcotic induced gastroparesis, however review of [place] PMP database shows no controlled Rx last 12 months.      Past Medical History:  Past Medical History   Diagnosis Date   . Diabetes    . Hypertensive disorder    . Gastroparesis        Past Surgical History:     Past Surgical History   Procedure Date   . Cholecystectomy        Family History:   No family history on file.    Social History:     History     Social History   . Marital Status: Single     Spouse Name: N/A     Number of Children: N/A   . Years of Education: N/A     Social History Main Topics   . Smoking status: Never Smoker    . Smokeless tobacco: Never Used   . Alcohol Use: No   . Drug Use: No   . Sexually Active: Not on file     Other Topics Concern   . Not on file     Social History Narrative   . No narrative on file        Allergies:   No Known Allergies    Medications:     (Not in a hospital admission)    Review of Systems:   A comprehensive review of systems was:   Gen: no fevers, some chills w/ vomiting  HEENT: no vision changes, no congestion  CV: center of his chest is sore from so much vomiting, no palpitations  Pulm: no cough ?pleuritic pain vs sore from vomiting or abd pain radiating up  GI: n/v/d, no diarrhea x 24 hours  GU: no dysuria, urinary complaints  MSK: has chronic L knee swelling from an old injury, no other complaints  Neuro: b/l LE neuropathy, used to take gabapentin for this      Physical Exam:     Filed Vitals:    06/27/12 0000   BP: 218/105   Pulse: 84   Temp:    Resp:    SpO2: 96%       Gen: NAD, actively vomiting male, able to answer questions but nauseated throughout interview and exam  HEENT: eomi mmm, exam limited by nausea  CV: rrr s1/s2 no m/r/g  Pulm: CTAB, exam sl limited by body habitus  Abd: soft mild ttp diffusely upper abd, centralized over epigastrum, protuberant ND +NABS  Ext: wwp no edema    Labs:     Results     Procedure Component Value Units Date/Time    Comprehensive metabolic panel [[id number]]  (Abnormal) Collected:06/26/12 1914    Specimen Information:Blood Updated:06/26/12 1953     Glucose 192 (H) mg/dL      BUN 20.2 mg/dL      Creatinine 1.2 mg/dL      Sodium 542      Potassium 3.4 (L)      Chloride 104      CO2 25      Calcium 9.2 mg/dL      Protein, Total 7.2 g/dL      Albumin 3.9 g/dL      AST (SGOT) 33 U/L      ALT 38 U/L      Alkaline Phosphatase 88 U/L      Bilirubin, Total 1.2 mg/dL      Globulin 3.3 g/dL      Albumin/Globulin Ratio 1.2     Lipase [[id number]] Collected:06/26/12 1914    Specimen Information:Blood Updated:06/26/12 1953     Lipase 76 U/L     GFR [[id number]] Collected:06/26/12 1914  EGFR >60.0 Updated:06/26/12 1953    CBC and differential [[id number]]  (Abnormal) Collected:06/26/12 1914    Specimen Information:Blood / Blood Updated:06/26/12 1936     WBC 7.39       RBC 5.54      Hgb 14.9 g/dL      Hematocrit 69.6 %      MCV 79.2 (L) fL      MCH 26.9 (L) pg      MCHC 33.9 g/dL      RDW 13 %      Platelets 314      MPV 8.9 (L) fL      Neutrophils 65 %      Lymphocytes Automated 25 %      Monocytes 8 %      Eosinophils Automated 1 %      Basophils Automated 0 %      Immature Granulocyte 0 %      Nucleated RBC 0      Neutrophils Absolute 4.81      Abs Lymph Automated 1.86      Abs Mono Automated 0.59      Abs Eos Automated 0.09      Absolute Baso Automated 0.01      Absolute Immature Granulocyte 0.03     i-Stat CG4 Venous CartrIDge [[id number]] Collected:06/26/12 1926     i-STAT pH Venous 7.480 Updated:06/26/12 1934     i-STAT pCO2 Venous 37.1      i-STAT pO2 Venous 55.0      i-STAT HCO3 Bicarbonate Venous 27.7      i-STAT Total CO2 Venous 29.0      i-STAT Base Excess Venous 4.0      i-STAT O2 Saturation Venous 91.0 %      i-STAT Lactic acid 2.2      i-STAT Patient Temperature 98.2      i-STAT FIO2 21      i-STAT Allen's Test NA      i-STAT Draw Site Venous     Urinalysis with microscopic [[id number]]  (Abnormal) Collected:06/26/12 1914    Specimen Information:Urine Updated:06/26/12 1929     Urine Type Clean Catch      Color, UA Yellow      Clarity, UA Clear      Specific Gravity UA 1.024      Urine pH 6.0      Leukocytes, UA Negative      Nitrite, UA Negative      Protein, UA Negative      Glucose, UA >1000 (A)      Ketones UA Negative      Urobilinogen, UA Normal mg/dL      Bilirubin, UA Negative      Blood, UA Negative      RBC, UA 0 - 5      WBC, UA 0 - 5      Squamous Epithelial Cells, Urine 0 - 5      Urine Amorphous Rare     POCT Glucose [[id number]]  (Abnormal) Collected:06/26/12 1916     POCT Glucose WB 189 (A) mg/dL [id number]:87/56/43 3295          Rads:   No imaging this admit.  Most recent is CT abd/pelvis from 5/11 which showed essentially normal study.    Signed by: Meade Maw, PGY2  [facility]  Office: [phone]  Spectra: [phone]  After hours:  [phone]

## 2012-06-27 NOTE — PT Eval Note (Signed)
[facility]     Physical Therapy Evaluation/Discharge     Patient: [name]    MRN#: [id number]   [facility]  Bed: VW098/JX914-78    Time of treatment: Time Calculation  PT Received On: 06/27/12  Start Time: 1330  Stop Time: 1400  Time Calculation (min): 30 min    Consult received for [name] for PT Evaluation and Treatment.  Patient's medical condition is appropriate for Physical Therapy evaluation at this time.    Precautions and Contraindications:   none    Medical Diagnosis: Gastroparesis [536.3]  286185 Diabetic gastroparesis associated with type 2 diabetes [id number]  [id number] Diabetic gastroparesis associated with type 2 diabetes [id number]    History of Present Illness: [name] is a 37 y.o. male admitted on 06/26/2012   As per H and P:  "[name] w/ chronic abd pain, gastroparesis, uncontrolled DM2 here w/ nv unable to tolerate PO one day after discharge from [facility]. VSS, though hypertensive, and blood sugars <200 with no laboratory evidence of dehydration or acidosis."       Patient Active Problem List   Diagnosis   . DKA (diabetic ketoacidoses)   . GERD (gastroesophageal reflux disease)   . Gastroparesis   . Dehydration, moderate   . Gastritis without bleeding   . AKI (acute kidney injury)   . Type 2 diabetes, uncontrolled, with neuropathy   . Hypertension   . Nausea & vomiting        Past Medical/Surgical History:  Past Medical History   Diagnosis Date   . Diabetes    . Hypertensive disorder    . Gastroparesis       Past Surgical History   Procedure Date   . Cholecystectomy          X-Rays/Tests/Labs:  Troponin (-)ve    Social History:  Prior Level of Function: Independent, still works and Science writer: none  DME Currently at Home: none  Home Living Arrangements: with friend  Type of Home: apartment  Home Layout: STE    Subjective: Patient is agreeable to participation in the therapy session. Nursing clears patient for therapy.      Patient Goal: "get better"    Pain:   Scale: Denies pain    Objective:  Patient received  in bed  with IV, tele in place.    Cognitive Status and Neuro Exam:  Alert and oriented  X 3    Musculoskeletal Examination  RUE ROM: WFL  LUE ROM: WFL  RLE ROM: WFL  LLE ROM: WFL    RUE Strength: WFL  LUE Strength: WFL  RLE Strength: WFL  LLE Strength: WFL      Functional Mobility  Rolling: independent  Supine to Sit: independent  Scooting: independent  Sit to Stand: independent  Stand to Sit: independent  Transfers: independent    Ambulation  Level of assistance required: independent  Ambulation Distance: 200 ft  Pattern: wfl  Device Used: none  Stair Management: MI with R handrails  Number of Stairs: 5     Balance  Static Sitting: good  Dynamic Sitting: good  Static Standing: good  Dynamic Standing: good    Participation and Activity Tolerance  Participation Effort: good  Endurance: good    Treatment Activities: evaluation  Educated the patient to role of physical therapy, plan of care, goals of therapy and safety with mobility and ADLs, discharge instructions, home safety.      Assessment: [name] Lotito  [name] is a 37 y.o. male admitted 06/26/2012  with no significant change in functional ambulation and mobility as compared to baseline.  Pt presents at functional baseline, independent with basic mobility skills.  No acute PT needs identified. D/C acute PT services.     Plan: D/C acute PT services  Therapy Diagnosis: Evaluation for gait impairement    Discharge Recommendation: Home with no needs  DME Recommendation: None    Carolan Shiver, PT Pager [phone]

## 2012-06-27 NOTE — ED Notes (Signed)
Returned to ED, 4W does not admit monitored pt's.  Waiting for new bed assignment.

## 2012-06-27 NOTE — ED Notes (Signed)
Spoke with admitting physician, Dr. Tawni Pummel of [facility], via telephone regarding pt's BP.  States she is aware and is not ordering any meds at this time.

## 2012-06-27 NOTE — Progress Notes (Addendum)
MEDICINE PROGRESS NOTE    Date Time: 06/27/2012 7:42 AM  Patient Name: [name]  Attending Physician: Waylan Rocher, MD    Assessment:   Principal Problem:   *Nausea & vomiting  Active Problems:   GERD (gastroesophageal reflux disease)   Gastroparesis   Type 2 diabetes, uncontrolled, with neuropathy   Hypertension  Resolved Problems:   * No resolved hospital problems. *     [name] w/ chronic abd pain, gastroparesis, uncontrolled DM2 presents with N/V unable to tolerate PO one day after discharge from [facility] without e/o dehydration or acidosis.    Plan:   #N/V and abdominal pain from gastroparesis likely 2/2 poorly controlled DM2 and/or narcotic induced:   -lipase normal, normal WBC, s/p lap chole   -NPO for now with plans to advance to non dairy puree when ready for PO trial   -continue IVF with NS+81mEqK @ 100cc/hr  -will continue to avoid narcotics (discussed this with patient)  -for now, continue erythromycin 500 mg q8hr for next 24 hrs  -continue reglan 10mg  and carafate 4x daily when able to tolerate   -zofran, phenergan prn nausea  -toradol prn for ain    #HTN:   -giving Vasotec 1.25 mg q6hr scheduled while NPO and will consider addition of labetalol if not able to advance to sips and need add'l control   -will plan to restart home lisinopril 40mg  and hctz 25mg  once able to po, was also on norvasc at [facility]     #DM2:   -will cont 25 units Lantus qhs as he received this last night without issue while NPO (home dose is 36 units)   -low correctional insulin for now given npo status   -accuchecks q6h (have ranged from 175-192)    #FEN:  -NPO for now with IVF (will advance to non dairy pureed diet when ready for po again)    #PPX:   -lovenox, protonix IV     #Dispo: will consult CM today in terms of providing care coordination if possible and get any assistance in obtaining medications prior to d/c to optimize compliance.      Subjective   Still with numerous episodes of vomiting despite zofran and phenergan  since admission. Complains of pain in the abdomen relieved with Toradol.    Physical Exam:     VITAL SIGNS PHYSICAL EXAM   Temp:  [98.2 F (36.8 C)-98.3 F (36.8 C)] 98.3 F (36.8 C)  Heart Rate:  [70-111] 111   Resp Rate:  [18-20] 18   BP: (151-224)/(94-122) 151/102 mmHg    No intake or output data in the 24 hours ending 06/27/12 0742 Physical Exam  Gen: tired but able to answer questions. C/o persistent nausea   CV: RRR s1/s2 no m/r/g   Pulm: CTAB  Abd: soft, mild TTP in b/l upper quadrants and epigastric region, diminished bowel sounds throughout   Ext: no edema         Meds:     Scheduled Meds:  Current Facility-Administered Medications   Medication Dose Route Frequency   . amLODIPine  5 mg Oral Daily   . [COMPLETED] enalaprilat  1.25 mg Intravenous Once   . enalaprilat  1.25 mg Intravenous Q6H SCH   . enoxaparin  40 mg Subcutaneous Daily   . erythromycin  500 mg Intravenous Q8H SCH   . [COMPLETED] HYDROmorphone  1 mg Intravenous Once   . [COMPLETED] HYDROmorphone  1 mg Intravenous Once   . [COMPLETED] HYDROmorphone  1 mg Intravenous  Once   . insulin glargine  25 Units Subcutaneous QHS   . [COMPLETED] ketorolac  30 mg Intravenous Once   . metoclopramide  10 mg Intravenous Q6H SCH   . [COMPLETED] ondansetron  4 mg Intravenous Once   . [COMPLETED] ondansetron  4 mg Intravenous Once   . pantoprazole  40 mg Intravenous Daily   . [COMPLETED] promethazine  12.5 mg Intravenous Once   . [COMPLETED] promethazine  12.5 mg Intravenous Once   . [COMPLETED] sodium chloride  1,000 mL Intravenous Once   . [COMPLETED] sodium chloride  1,000 mL Intravenous Once   . sucralfate  1 g Oral TID   . [DISCONTINUED] lisinopril  40 mg Oral Daily     Continuous Infusions:     . 0.9 % NaCl with KCl 20 mEq 100 mL/hr at 06/27/12 0725     PRN Meds:.dextrose, dextrose, glucagon (rDNA), insulin aspart, ondansetron, promethazine      Labs:     Labs (last 72 hours):      Lab 06/26/12 1914 06/24/12 0654   WBC 7.39 7.73   HGB 14.9 14.3   HCT  43.9 43.2   PLT 314 286       No results found for this basename: PT:2,INR:2,PTT:2 in the last 168 hours   Lab 06/26/12 1914 06/24/12 0659   NA 141 144   K 3.4* 3.6   CL 104 107   CO2 25 24   BUN 10.0 9.2   CREAT 1.2 1.3   CA 9.2 8.7   ALB 3.9 --   PROT 7.2 --   BILITOTAL 1.2 --   ALKPHOS 88 --   ALT 38 --   AST 33 --   GLU 192* 178*                 Signed by: Angelique Blonder, MD

## 2012-06-27 NOTE — ED Notes (Signed)
Patient is resting comfortably. 

## 2012-06-27 NOTE — Progress Notes (Signed)
[facility] Attending Note    This patient was reviewed on rounds with Dr. Zeb Comfort.  Physical exam confirmed and duplicated.  Severe vomiting during my visit, small amt of BRB witnessed at the end of emesis.  Had just rec'd zofran.  Having epigastric pain asstd with gastroparesis which he says was dx'd 2012.  Will check CBC later and adjusted meds for nausea.  Agree, no narcotics at this time.  Pt denies ever or recent use of marijuana.    Reviewed records from [facility] 2012 when he first presented with these sxs.  GB was eventually removed, to no avail.  EGD 2012 showed mult erosions, no ulcers.    I agree with assessment and plan as outlined.      Ronelle Smallman J. Keir Viernes, M.D  Attending  [facility]  Physician [phone]  [phone]      Patient Active Problem List   Diagnosis   . DKA (diabetic ketoacidoses)   . GERD (gastroesophageal reflux disease)   . Gastroparesis   . Dehydration, moderate   . Gastritis without bleeding   . AKI (acute kidney injury)   . Type 2 diabetes, uncontrolled, with neuropathy   . Hypertension   . Nausea & vomiting

## 2012-06-27 NOTE — Plan of Care (Signed)
Patient c/o nausea and abd pain through the day.  Given Phenergen, Zofran, and Toradol, patient denied wanting to take any medicaitons po.  Resident called, patient requesting Dilaudid for pain, Resident increased the dose of Toradol and does not want the patient to have any narcotics.  Discussed this with patient, will cotninue to monitor.

## 2012-06-27 NOTE — Plan of Care (Signed)
Telemetry SR 80s. Pt admitted from ED. vss other than elevated BP of 151/102. MD aware. Pt has has numerous episodes of vomiting, Phenergan given. Pt complains of severe pain in abdomen. MD notified and Toradol ordered. Will continue to monitor Pt.

## 2012-06-27 NOTE — ED Notes (Signed)
Bed:S 17<BR> Expected date:<BR> Expected time:<BR> Means of arrival:<BR> Comments:<BR>

## 2012-06-28 LAB — GFR: EGFR: >60

## 2012-06-28 LAB — CBC
Hematocrit: 40.3 % — ABNORMAL LOW (ref 42.0–52.0)
Hgb: 13.6 g/dL (ref 13.0–17.0)
MCH: 26.9 pg — ABNORMAL LOW (ref 28.0–32.0)
MCHC: 33.7 g/dL (ref 32.0–36.0)
MCV: 79.8 fL — ABNORMAL LOW (ref 80.0–100.0)
MPV: 9.4 fL (ref 9.4–12.3)
Nucleated RBC: 0 (ref 0–1)
Platelets: 281 (ref 140–400)
RBC: 5.05 (ref 4.70–6.00)
RDW: 13 % (ref 12–15)
WBC: 7.52 (ref 3.50–10.80)

## 2012-06-28 LAB — BASIC METABOLIC PANEL
BUN: 10 mg/dL (ref 9.0–21.0)
CO2: 24 (ref 22–29)
Calcium: 8.2 mg/dL — ABNORMAL LOW (ref 8.5–10.5)
Chloride: 103 (ref 98–107)
Creatinine: 1.1 mg/dL (ref 0.7–1.3)
Glucose: 204 mg/dL — ABNORMAL HIGH (ref 70–100)
Potassium: 3.9 (ref 3.5–5.1)
Sodium: 137 (ref 136–145)

## 2012-06-28 LAB — POCT GLUCOSE
Whole Blood Glucose POCT: 194 mg/dL — AB (ref 70–100)
Whole Blood Glucose POCT: 197 mg/dL — AB (ref 70–100)
Whole Blood Glucose POCT: 182 mg/dL — AB (ref 70–100)
Whole Blood Glucose POCT: 170 mg/dL — AB (ref 70–100)

## 2012-06-28 MED ORDER — HYDROMORPHONE HCL PF 1 MG/ML IJ SOLN
1.0000 mg | INTRAMUSCULAR | Status: DC | PRN
Start: 2012-06-28 — End: 2012-06-30
  Administered 2012-06-28 – 2012-06-29 (×6): 1 mg via INTRAVENOUS
  Filled 2012-06-28 (×7): qty 1

## 2012-06-28 MED ORDER — METOPROLOL TARTRATE 1 MG/ML IV SOLN
10.0000 mg | Freq: Two times a day (BID) | INTRAVENOUS | Status: DC
Start: 2012-06-28 — End: 2012-06-28

## 2012-06-28 MED ORDER — LABETALOL HCL 5 MG/ML IV SOLN
20.00 mg | Freq: Two times a day (BID) | INTRAVENOUS | Status: DC
Start: 2012-06-28 — End: 2012-06-28

## 2012-06-28 MED ORDER — SODIUM CHLORIDE 0.9 % IV SOLN
500.0000 mg | Freq: Four times a day (QID) | INTRAVENOUS | Status: DC
Start: 2012-06-28 — End: 2012-06-30
  Administered 2012-06-28 – 2012-06-30 (×9): 500 mg via INTRAVENOUS
  Filled 2012-06-28 (×15): qty 500

## 2012-06-28 MED ORDER — METOPROLOL TARTRATE 1 MG/ML IV SOLN
2.5000 mg | Freq: Four times a day (QID) | INTRAVENOUS | Status: DC
Start: 2012-06-28 — End: 2012-06-29
  Administered 2012-06-28 – 2012-06-29 (×5): 2.5 mg via INTRAVENOUS
  Filled 2012-06-28 (×4): qty 5

## 2012-06-28 MED ORDER — INSULIN GLARGINE 100 UNIT/ML SC SOLN
30.00 [IU] | Freq: Every evening | SUBCUTANEOUS | Status: DC
Start: 2012-06-28 — End: 2012-06-29
  Administered 2012-06-28: 30 [IU] via SUBCUTANEOUS

## 2012-06-28 NOTE — Plan of Care (Signed)
Pt alert and oriented; c/o pain and nausea this am; emesis noted; medicated with prn pain med with some relief obtained; b/p elevated; will admin meds as per MD order and continue to monitor VS ; IVF infusing without difficulty; will monitor

## 2012-06-28 NOTE — Progress Notes (Addendum)
MEDICINE PROGRESS NOTE    Date Time: 06/28/2012 10:15 AM  Patient Name: [name]  Attending Physician: Waylan Rocher, MD    Assessment:   Principal Problem:   *Nausea & vomiting  Active Problems:   GERD (gastroesophageal reflux disease)   Gastroparesis   Dehydration, moderate   Type 2 diabetes, uncontrolled, with neuropathy   Hypertension  Resolved Problems:   * No resolved hospital problems. *     [name] w/ chronic abd pain, gastroparesis, uncontrolled DM2 presents with N/V unable to tolerate PO one day after discharge from [facility] without e/o dehydration or acidosis.    Plan:   #N/V and abdominal pain from gastroparesis likely 2/2 poorly controlled DM2 (less likely narcotic induced):   -lipase normal, normal WBC, s/p lap chole   -NPO for now with plans to advance to non dairy puree when ready for PO trial   -continue IVF with NS+78mEqK @ 100cc/hr  -for now, continue erythromycin 500 mg q8hr for next 24 hrs  -continue reglan 10mg  and carafate 4x daily when able to tolerate   -zofran, phenergan, prn nausea and scopolamine patch  -toradol prn for pain first, but also will give dilaudid IV for pain in the short term and stop once better    #HTN:   -giving Vasotec 2.5 mg q6hr scheduled while NPO and added Metoprolol IV as still unable to advance to sips   -will plan to restart home lisinopril 40mg  and hctz 25mg  once able to po, was also on norvasc at [facility]     #DM2:   -will increase Lantus while NPO to 30 units (home dose is 36 units)   -low correctional insulin for now given npo status   -accuchecks q6h (have ranged from 173-204)    #FEN:  -NPO for now with IVF (will advance to non dairy pureed diet when ready for po again)    #PPX:   -lovenox, protonix IV     #Dispo: consulted CM yesterday in terms of providing care coordination if possible and getting any assistance in obtaining medications prior to d/c to optimize compliance.      Subjective   Numerous episodes of aqueous vomiting throughout the day yesterday  and this morning. BP elevated so Labetalol given ON. Also added scopolamine patch for relief of nausea to other antiemetics. Patient requesting Dilaudid but explained that this will ultimately cause his gastroparesis to worsen.    Physical Exam:     VITAL SIGNS PHYSICAL EXAM   Temp:  [98.6 F (37 C)-99.5 F (37.5 C)] 98.7 F (37.1 C)  Heart Rate:  [89-99] 95   Resp Rate:  [12-18] 18   BP: (169-233)/(90-114) 169/90 mmHg    No intake or output data in the 24 hours ending 06/28/12 1015 Physical Exam  Gen: nauseated but A&O x3   CV: RRR s1/s2 no m/r/g   Pulm: CTAB  Abd: soft, mild TTP in b/l upper quadrants and epigastric region, diminished bowel sounds throughout   Ext: no edema         Meds:     Scheduled Meds:  Current Facility-Administered Medications   Medication Dose Route Frequency   . amLODIPine  5 mg Oral Daily   . enalaprilat  2.5 mg Intravenous Q6H SCH   . enoxaparin  40 mg Subcutaneous Daily   . [EXPIRED] erythromycin  500 mg Intravenous Q8H SCH   . insulin glargine  25 Units Subcutaneous QHS   . [COMPLETED] ketorolac  30 mg Intravenous Once   . [  COMPLETED] labetalol  20 mg Intravenous Once   . metoclopramide  10 mg Intravenous Q6H SCH   . pantoprazole  40 mg Intravenous Daily   . scopolamine  1 patch Transdermal Q72H   . sucralfate  1 g Oral TID   . [DISCONTINUED] enalaprilat  1.25 mg Intravenous Q6H SCH   . [DISCONTINUED] metoclopramide  10 mg Intravenous Q6H SCH     Continuous Infusions:       . 0.9 % NaCl with KCl 20 mEq 100 mL/hr (06/28/12 0518)     PRN Meds:.dextrose, dextrose, glucagon (rDNA), insulin aspart, ketorolac, ondansetron, promethazine, [DISCONTINUED] ketorolac, [DISCONTINUED] ondansetron, [DISCONTINUED] promethazine      Labs:     Labs (last 72 hours):      Lab 06/28/12 0538 06/27/12 1720   WBC 7.52 7.77   HGB 13.6 14.5   HCT 40.3* 43.1   PLT 281 277       No results found for this basename: PT:2,INR:2,PTT:2 in the last 168 hours   Lab 06/28/12 0537 06/26/12 1914   NA 137 141   K 3.9  3.4*   CL 103 104   CO2 24 25   BUN 10.0 10.0   CREAT 1.1 1.2   CA 8.2* 9.2   ALB -- 3.9   PROT -- 7.2   BILITOTAL -- 1.2   ALKPHOS -- 88   ALT -- 38   AST -- 33   GLU 204* 192*                 Signed by: Angelique Blonder, MD

## 2012-06-28 NOTE — Consults (Signed)
[facility]  CONSULTATION NOTE    Date Time: 06/28/2012 3:10 PM  Patient Name: [name]  Requesting Physician: Waylan Rocher, MD       Reason for Consultation:   Abd pain  N/V  Gastroparesis  Assessment and Plan:   Assessment:  1.NV, likely 2/2 diabetic gastroparesis.  2. Abd pain, probably 2/2 known gastroparesis.  3. Gastritis, hx.  4. DM2, insulin-dependant    Plan:  1. Restart Reglan 10 mg QID.   Would not use Reglan > 2 months given the risk of tardive dyskinesia.  2. Continue erythromycin 250 mg QID.   3. Continue Carafate QID  4. Increase Protonix to IV BID.  5. Antiemetics and analgesia prn per primary team.    6. Clear liquid diet as tolerated, with small frequent portions.    History:   [name] is a 37 y.o. male with h/o DM, gastroparesis, and gastritis who presents to the hospital on 06/26/2012 with abd pain and N/V. His gastroparesis was previously well controlled with reglan and sucralfate four times daily, however due to not taking these medications and not taking his insulin he has had at least 3 admissions since Oct 2013 for n/v, most recently presenting to [facility] with DKA. At that time gastroenterology was consulted and advised avoiding all narcotics and dietary modification to improve gastric emptying. He was gradually advanced to a pureed, dairy free diet and tolerated PO, though the patient reported he was having persistent abd pain, nausea, and loose stools during that hospitalization and this continued after discharge. He was asked to continue reglan, protonix, and carafate on discharge; however patient reports he was unable to take these due to cost of medications. Within 24 hours of discharge was unable to tolerate any PO and so presented to [facility] ED.   He notes N is somewhat improved today.     Past Medical History:     Past Medical History   Diagnosis Date   . Diabetes    . Hypertensive disorder    . Gastroparesis        Past  Surgical History:     Past Surgical History   Procedure Date   . Cholecystectomy        Family History:   No family history on file.    Social History:     History     Social History   . Marital Status: Single     Spouse Name: N/A     Number of Children: N/A   . Years of Education: N/A     Social History Main Topics   . Smoking status: Never Smoker    . Smokeless tobacco: Never Used   . Alcohol Use: No   . Drug Use: No   . Sexually Active: Not on file     Other Topics Concern   . Not on file     Social History Narrative   . No narrative on file       Allergies:   No Known Allergies    Medications:     Current Facility-Administered Medications   Medication Dose Route Frequency   . amLODIPine  5 mg Oral Daily   . enalaprilat  2.5 mg Intravenous Q6H SCH   . enoxaparin  40 mg Subcutaneous Daily   . [EXPIRED] erythromycin  500 mg Intravenous Q8H SCH   . erythromycin  500 mg Intravenous Q6H SCH   . insulin glargine  30 Units Subcutaneous QHS   . [  COMPLETED] ketorolac  30 mg Intravenous Once   . [COMPLETED] labetalol  20 mg Intravenous Once   . metoclopramide  10 mg Intravenous Q6H SCH   . metoprolol  2.5 mg Intravenous Q6H SCH   . pantoprazole  40 mg Intravenous Daily   . scopolamine  1 patch Transdermal Q72H   . sucralfate  1 g Oral TID   . [DISCONTINUED] enalaprilat  1.25 mg Intravenous Q6H SCH   . [DISCONTINUED] insulin glargine  25 Units Subcutaneous QHS   . [DISCONTINUED] labetalol  20 mg Intravenous BID   . [DISCONTINUED] metoclopramide  10 mg Intravenous Q6H SCH   . [DISCONTINUED] metoprolol  10 mg Intravenous BID       Review of Systems:   Complete 12 Point ROS negative or normal except those mentioned in HPI above.    Physical Exam:     Filed Vitals:    06/28/12 1107   BP: 167/90   Pulse: 84   Temp: 98.6 F (37 C)   Resp: 18   SpO2: 96%       General appearance: Well developed, well nourished, appears stated age and in NAD  Eyes: Sclera anicteric, pink conjunctivae, no ptosis  ENMT: mucous membranes moist, nose  and ears appear normal.  Oropharynx clear.  Chest: Non labored respirations, no audible wheezing, no clubbing or cyanosis  CV:  Regular rate and rhythm, no JVD, no LE edema  Abdomen: soft, non-tender, non-distended, no masses or organomegaly  Skin: Normal color and turgor, no rashes, no suspicious skin lesions noted  Neuro: CN II-XII grossly intact.  No gross movement disorders noted.  Mental status: Appropriate affect, alert and oriented x 3    Labs Reviewed:     Recent Labs   [facility] 06/28/12 0538 06/27/12 1720    WBC 7.52 7.77    HGB 13.6 14.5    HCT 40.3* 43.1    PLT 281 277    MCV 79.8* 79.4*       Recent Labs   Basename 06/28/12 0537 06/26/12 1914    NA 137 141    K 3.9 3.4*    CL 103 104    CO2 24 25    BUN 10.0 10.0    CREAT 1.1 1.2    GLU 204* 192*    CA 8.2* 9.2    MG -- --    PHOS -- --       Recent Labs   [facility] 06/26/12 1914    AST 33    ALT 38    ALKPHOS 88    BILITOTAL 1.2    BILIDIRECT --    PROT 7.2    ALB 3.9       No results found for this basename: PTT:2,PT:2,INR:2 in the last 72 hours     Radiology:   Radiological Procedure reviewed:  No imaging this admit. Most recent is CT abd/pelvis from 5/11 which showed essentially normal study.

## 2012-06-28 NOTE — Progress Notes (Addendum)
[facility] Attending Note    This patient was reviewed on rounds with Dr. Zeb Comfort.  Physical exam confirmed and duplicated.  Labs stable, BS's managed, no acidosis, RF nrl.  Still very symptomatic, frequent episodes of paroxysmal vomiting, bilious and clear, no blood since yesterday.  Painful after episodes, somewhat relieved by Toredal but not as well as with dilaudid used previously.  Reports his longest stay in the past for similar episode was 2 weeks.    Discussed with Dr. Louie Bun (GI).  He will see in consultation for further management recommendations.    I agree with assessment and plan as outlined.      Anntoinette Haefele J. Annita Ratliff, M.D  Attending  [facility]  Physician [phone]  [phone]      Patient Active Problem List   Diagnosis   . DKA (diabetic ketoacidoses)   . GERD (gastroesophageal reflux disease)   . Gastroparesis   . Dehydration, moderate   . Gastritis without bleeding   . AKI (acute kidney injury)   . Type 2 diabetes, uncontrolled, with neuropathy   . Hypertension   . Nausea & vomiting

## 2012-06-28 NOTE — Progress Notes (Signed)
Pt with N/V and epigastric abdominal pain; clear, aqueous emesis. Hypertensive with NSR in the 90s to tele. VS otherwise stable. Pt requesting increased frequency of admin of IV phenergan, Zofran, and Toradol. States that medications "wear off to fast". MD contacted. Frequency of med admin not altered, however scopolamine patch was prescribed and applied to pts L ear. Nausea improved with admin of IV phenergan and Reglan this evening. Epigastric pain decreased with IV toradol. Labetalol IV administered for HTN. Pt resting comfortably at present. Will continue to monitor

## 2012-06-29 LAB — CBC
Hematocrit: 41.1 % — ABNORMAL LOW (ref 42.0–52.0)
Hgb: 13.5 g/dL (ref 13.0–17.0)
MCH: 26.4 pg — ABNORMAL LOW (ref 28.0–32.0)
MCHC: 32.8 g/dL (ref 32.0–36.0)
MCV: 80.3 fL (ref 80.0–100.0)
MPV: 9.6 fL (ref 9.4–12.3)
Nucleated RBC: 0 (ref 0–1)
Platelets: 279 (ref 140–400)
RBC: 5.12 (ref 4.70–6.00)
RDW: 13 % (ref 12–15)
WBC: 7.32 (ref 3.50–10.80)

## 2012-06-29 LAB — BASIC METABOLIC PANEL
BUN: 10 mg/dL (ref 9.0–21.0)
CO2: 26 (ref 22–29)
Calcium: 8.5 mg/dL (ref 8.5–10.5)
Chloride: 103 (ref 98–107)
Creatinine: 1 mg/dL (ref 0.7–1.3)
Glucose: 169 mg/dL — ABNORMAL HIGH (ref 70–100)
Potassium: 4.1 (ref 3.5–5.1)
Sodium: 137 (ref 136–145)

## 2012-06-29 LAB — POCT GLUCOSE
Whole Blood Glucose POCT: 194 mg/dL — AB (ref 70–100)
Whole Blood Glucose POCT: 134 mg/dL — AB (ref 70–100)
Whole Blood Glucose POCT: 132 mg/dL — AB (ref 70–100)
Whole Blood Glucose POCT: 184 mg/dL — AB (ref 70–100)
Whole Blood Glucose POCT: 161 mg/dL — AB (ref 70–100)

## 2012-06-29 LAB — GFR: EGFR: >60

## 2012-06-29 MED ORDER — PANTOPRAZOLE SODIUM 40 MG PO TBEC
40.0000 mg | DELAYED_RELEASE_TABLET | Freq: Two times a day (BID) | ORAL | Status: DC
Start: 2012-06-29 — End: 2012-06-30
  Administered 2012-06-29 – 2012-06-30 (×2): 40 mg via ORAL
  Filled 2012-06-29 (×2): qty 1

## 2012-06-29 MED ORDER — INSULIN GLARGINE 100 UNIT/ML SC SOLN
32.0000 [IU] | Freq: Every evening | SUBCUTANEOUS | Status: DC
Start: 2012-06-29 — End: 2012-06-30
  Administered 2012-06-29: 32 [IU] via SUBCUTANEOUS
  Filled 2012-06-29: qty 320

## 2012-06-29 MED ORDER — LISINOPRIL 20 MG PO TABS
40.0000 mg | ORAL_TABLET | Freq: Every day | ORAL | Status: DC
Start: 2012-06-29 — End: 2012-06-30
  Administered 2012-06-29 – 2012-06-30 (×2): 40 mg via ORAL
  Filled 2012-06-29 (×2): qty 2

## 2012-06-29 MED ORDER — ENALAPRILAT 1.25 MG/ML IV INJ
2.5000 mg | INJECTION | Freq: Four times a day (QID) | INTRAVENOUS | Status: DC | PRN
Start: 2012-06-29 — End: 2012-06-30
  Filled 2012-06-29 (×4): qty 2

## 2012-06-29 MED ORDER — HYDROCHLOROTHIAZIDE 25 MG PO TABS
25.0000 mg | ORAL_TABLET | Freq: Every day | ORAL | Status: DC
Start: 2012-06-29 — End: 2012-06-30
  Administered 2012-06-29 – 2012-06-30 (×2): 25 mg via ORAL
  Filled 2012-06-29 (×2): qty 1

## 2012-06-29 MED ORDER — METOPROLOL TARTRATE 1 MG/ML IV SOLN
2.5000 mg | Freq: Four times a day (QID) | INTRAVENOUS | Status: DC | PRN
Start: 2012-06-29 — End: 2012-06-30

## 2012-06-29 MED ORDER — INSULIN GLARGINE 100 UNIT/ML SC SOLN
36.00 [IU] | Freq: Every evening | SUBCUTANEOUS | Status: DC
Start: 2012-06-29 — End: 2012-06-29

## 2012-06-29 NOTE — Progress Notes - NICU (Signed)
Pt is a/ox4, clear liquid diet ordered for lunch. Pi tolerated  ice chip. Pain management with dilaudid 1 mg iv  As needed q 4 hour effective. ivf infusing well, bp monitored. No n/v noted.

## 2012-06-29 NOTE — Plan of Care (Signed)
Pt. Alert and oriented x 3. Denies chest pain or SOB. Receiving metoprolol and Vasotec  IV for elevated BP. Reglan and Zofran IV for nausea. SR on tele. Requesting dilaudid for pain.Will continue to monitor.

## 2012-06-29 NOTE — Discharge Summary (Signed)
FAMILY MEDICINE DISCHARGE & PROGRESS COMBINED NOTE    Date Time: 06/29/2012 9:45 AM  Patient Name: [name]  Attending Physician: Waylan Rocher, MD  Primary Care Physician: [name], MD    Date of Admission: 06/26/2012  Date of Discharge: 06/30/2012    Discharge Diagnoses:     Principal Problem:   *Nausea & vomiting  Active Problems:   GERD (gastroesophageal reflux disease)   Gastroparesis   Dehydration, moderate   Type 2 diabetes, uncontrolled, with neuropathy   Hypertension  Resolved Problems:   * No resolved hospital problems. *       Disposition:     home with family    Recent Labs - Last 2:         Lab 06/29/12 0526 06/28/12 0538   WBC 7.32 7.52   HGB 13.5 13.6   HCT 41.1* 40.3*   PLT 279 281       No results found for this basename: PT,INR,PTT in the last 168 hours    Lab 06/26/12 1914   ALKPHOS 88   BILITOTAL 1.2   BILIDIRECT --   PROT 7.2   ALB 3.9   ALT 38   AST 33        Lab 06/29/12 0526 06/28/12 0537   NA 137 137   K 4.1 3.9   CL 103 103   CO2 26 24   BUN 10.0 10.0   GLU 169* 204*   CA 8.5 8.2*   MG -- --   PHOS -- --       No results found for this basename: CHOL,TRIG,HDL,LDL in the last 168 hours  No results found for this basename: TSH,FREET3,FREET4 in the last 168 hours         Procedures/Radiology performed:     None       Hospital Course:     Reason for admission/ HPI: (per Dr. Rada Hay note): [name] is a 37 y.o. male h/o HTN, gastritis, DM2 on insulin, poorly controlled, with b/l LE neuropathy and chronic gastroparesis. His gastroparesis was previously well controlled with reglan and sucralfate four times daily, however due to not taking these medications and not taking his insulin he has had at least 3 admissions since Oct 2013 for n/v, most recently presenting to [facility] with DKA. At that time gastroenterology was consulted and advised avoiding all narcotics and dietary modification to improve gastric emptying. He was gradually advanced to a pureed, dairy free  diet and tolerated PO, though the patient reported he was having persistent abd pain, nausea, and loose stools during that hospitalization and this continued after discharge. He was asked to continue reglan, protonix, and carafate on discharge; however patient reports he was unable to take these due to cost of medications. Within 24 hours of discharge was unable to tolerate any PO and so presented to [facility] ED. Emesis NBNB.   Mr Packett typically takes 32-36 units of lantus QHS and SSI novolog. He did take his novolog am and midday today, though didn't keep lunch down. He has not taken any of his other medications since discharge 5/14.   PCP: Dr. Alben Spittle, last seen in Oct 2013, at which time had run out of lantus and so was not taking this.   Addendum: notes at [facility] indicated concern for narcotic induced gastroparesis, however review of [place] PMP database shows no controlled Rx last 12 months.        Hospital Course: Patient was admitted to the  floor and kept NPO with IVF until able to have a po trial on hospital day #3. His lipase and electrolytes were largely within normal limits aside from persistently elevated blood sugars but no acidosis noted. GI was consulted and recommended continuation of erythromycin as well as reglan and carafate. He was given dilaudid prn for pain and encouraged to try toradol first. He was given zofran, phenergan, and scopolamine patch for nausea. His blood pressure was elevated during his stay and while NPO he was given scheduled Vasotec and Metoprolol, although these were changed to prn and he was resumed on his home lisinopril, HCTZ, and norvasc once able to take po. His blood glucose was maintained on Lantus approaching his home dose and sliding scale insulin. Case management was also consulted at the beginning of his stay to provide help with possible care coordination and any assistance in obtaining medications prior to discharge to optimize compliance.  On the day of discharge, pt was  able to tolerate a full liquid diet.    Discharge Day Exam:  Temp:  [97.4 F (36.3 C)-98.6 F (37 C)] 97.6 F (36.4 C)  Heart Rate:  [17-88] 75   Resp Rate:  [18-20] 20   BP: (136-182)/(86-103) 156/92 mmHg  GENERAL: no acute distress, well-developed, well-nourished, alert, oriented  HEENT:  -NCAT, PERRL  -Moist mucous membranes  -Neck supple without adenopathy  CV: RRR, no murmurs, rubs, or gallops  PULM: CTAB, non-labored  GI: soft, NTND, BS+, no organomegaly  EXT: no edema, clubbing or cyanosis, warm & well perfused      Consultations:     Treatment Team: Attending Provider: Waylan Rocher, MD; Resident: Joanette Gula, MD; Resident: Verlee Rossetti, MD; Case Manager: Geryl Rankins; Technician: Maudry Mayhew Consulting Physician: Lestine Mount, MD; Registered Nurse: Delmer Islam, RN    Discharge Condition:     Stable    Discharge Instructions & Follow Up Plan for Patient:     Complete instructions and follow up are in the patient's After Visit Summary  Follow up with PCP: contacted office who will call pt for appt  Follow up with GI: In 2 weeks    Discharge Medications:        Medication List       As of 06/30/2012 10:51 AM      START taking these medications           erythromycin base 250 MG tablet    Commonly known as: E-MYCIN    Take 2 tablets (500 mg total) by mouth 4 (four) times daily.        ondansetron 8 MG tablet    Commonly known as: ZOFRAN    Take 1 tablet (8 mg total) by mouth every 8 (eight) hours as needed for Nausea.        scopolamine 1.5 MG    Commonly known as: TRANSDERM-SCOP    Place 1 patch onto the skin every third day.         CONTINUE taking these medications           amLODIPine 5 MG tablet    Commonly known as: NORVASC    Take 1 tablet (5 mg total) by mouth daily.        insulin aspart 100 UNIT/ML injection    Commonly known as: NovoLOG    Inject 1-8 Units into the skin 4 times daily - with meals and at bedtime.        insulin glargine 100 UNIT/ML  injection    Commonly  known as: LANTUS    Inject 36 Units into the skin nightly.        lisinopril 40 MG tablet    Commonly known as: PRINIVIL,ZESTRIL    Take 1 tablet (40 mg total) by mouth daily.        pantoprazole 40 MG tablet    Commonly known as: PROTONIX    Take 1 tablet (40 mg total) by mouth daily.        sucralfate 1 G tablet    Commonly known as: CARAFATE    Take 1 tablet (1 g total) by mouth 3 (three) times daily.         STOP taking these medications           hydrochlorothiazide 25 MG tablet    Commonly known as: HYDRODIURIL        metoclopramide 10 MG tablet    Commonly known as: REGLAN        ondansetron 4 MG disintegrating tablet    Commonly known as: ZOFRAN-ODT               Where to get your medications     These are the prescriptions that you need to pick up.    You may get these medications from any pharmacy.           erythromycin base 250 MG tablet    ondansetron 8 MG tablet    scopolamine 1.5 MG                       Signed by: Arvilla Meres, MD    CC: [name], MD

## 2012-06-29 NOTE — Progress Notes (Signed)
[facility] Attending Note    This patient was reviewed on rounds with Dr. Zeb Comfort.  Physical exam confirmed and duplicated.  Feeling slightly better, no severe vomiting since last night, some less sever this morning.  Dilaudid helping.  VSS, labs stable and checking mag.  Agree with incr in Lantus, BS's still high, no acidosis.    Appreciate Dr. Elmyra Ricks assistance.    I agree with assessment and plan as outlined.      Tywanda Rice J. Chance Munter, M.D  Attending  [facility]  Physician [phone]  [phone]      Patient Active Problem List   Diagnosis   . DKA (diabetic ketoacidoses)   . GERD (gastroesophageal reflux disease)   . Gastroparesis   . Dehydration, moderate   . Gastritis without bleeding   . AKI (acute kidney injury)   . Type 2 diabetes, uncontrolled, with neuropathy   . Hypertension   . Nausea & vomiting

## 2012-06-29 NOTE — Progress Notes (Signed)
[facility]  PROGRESS NOTE    Date Time: 06/29/2012 11:04 AM  Patient Name: [name], [name]      Chief Complaint:   Abd pain   N/V   Gastroparesis    Assessment and Plan:   Assessment:   1.NV, likely 2/2 diabetic gastroparesis.   2. Abd pain, probably 2/2 known gastroparesis.   3. Gastritis, hx.   4. DM2, insulin-dependant     Plan:   1. Continue Reglan 10 mg QID.   Would not use Reglan > 2 months given the risk of tardive dyskinesia.   2. Continue erythromycin 250 mg QID.   3. Continue Carafate QID   4. Change Protonix to po bid.  5. Antiemetics and analgesia prn per primary team but recommend using opiate analgesics very sparingly and only prn to avoid exacerbation of gastroparesis.  We had a long discussion about this today, and pt agreed to attempt to space out opiate analgesics as far as he could.  6. Gastroparesis diet as tolerated, with small frequent portions.    Subjective:   [name] is feeling better this AM. He notes nausea and abd pain have improved significantly. Per pt and his nurse, he is receiving Dilaudid Q4 hours regularly. Pt feels he is not yet ready to space out Dilaudid farther.   He notes he tolerated some clears last night and this AM.   He was afebrile overnight.     Medications:     Current Facility-Administered Medications   Medication Dose Route Frequency   . amLODIPine  5 mg Oral Daily   . enoxaparin  40 mg Subcutaneous Daily   . erythromycin  500 mg Intravenous Q6H SCH   . hydrochlorothiazide  25 mg Oral Daily   . insulin glargine  32 Units Subcutaneous QHS   . lisinopril  40 mg Oral Daily   . metoclopramide  10 mg Intravenous Q6H SCH   . pantoprazole  40 mg Intravenous Daily   . scopolamine  1 patch Transdermal Q72H   . sucralfate  1 g Oral TID   . [DISCONTINUED] enalaprilat  2.5 mg Intravenous Q6H SCH   . [DISCONTINUED] insulin glargine  30 Units Subcutaneous QHS   . [DISCONTINUED] insulin glargine  36 Units Subcutaneous QHS   . [DISCONTINUED]  metoprolol  2.5 mg Intravenous Q6H SCH       Review of Systems:   Complete 12 Point ROS negative or normal except those mentioned in HPI above.    Physical Exam:     Filed Vitals:    06/29/12 0854   BP: 156/92   Pulse: 75   Temp: 97.6 F (36.4 C)   Resp: 20   SpO2: 96%     General appearance: Well developed, well nourished, appears stated age and in NAD  Eyes: Sclera anicteric, pink conjunctivae, no ptosis  ENMT: mucous membranes moist, nose and ears appear normal.  Oropharynx clear.  Chest: Non labored respirations, no audible wheezing, no clubbing or cyanosis  CV:  Regular rate and rhythm, no JVD, no LE edema  Abdomen: soft, non-tender throughout, non-distended,no masses or organomegaly  Skin: Normal color and turgor, no rashes, no suspicious skin lesions noted  Neuro: CN II-XII grossly intact.  No gross movement disorders noted.  Mental status: Appropriate affect, alert and oriented x 3      Labs:     Recent Labs   [facility] 06/29/12 0526 06/28/12 0538    WBC 7.32 7.52    HGB 13.5  13.6    HCT 41.1* 40.3*    PLT 279 281    MCV 80.3 79.8*       Recent Labs   Basename 06/29/12 0526 06/28/12 0537    NA 137 137    K 4.1 3.9    CL 103 103    CO2 26 24    BUN 10.0 10.0    CREAT 1.0 1.1    GLU 169* 204*    CA 8.5 8.2*    MG -- --    PHOS -- --       Recent Labs   [facility] 06/26/12 1914    AST 33    ALT 38    ALKPHOS 88    BILITOTAL 1.2    BILIDIRECT --    PROT 7.2    ALB 3.9       No results found for this basename: PTT:2,PT:2,INR:2 in the last 72 hours     Radiology:   Radiological Procedure reviewed:  No imaging this admit. Most recent is CT abd/pelvis from 5/11 which showed essentially normal study.

## 2012-06-29 NOTE — Progress Notes (Addendum)
MEDICINE PROGRESS NOTE    Date Time: 06/29/2012 9:26 AM  Patient Name: [name]  Attending Physician: Waylan Rocher, MD    Assessment:   Principal Problem:   *Nausea & vomiting  Active Problems:   GERD (gastroesophageal reflux disease)   Gastroparesis   Dehydration, moderate   Type 2 diabetes, uncontrolled, with neuropathy   Hypertension  Resolved Problems:   * No resolved hospital problems. *     [name] w/ chronic abd pain, gastroparesis, uncontrolled DM2 presents with N/V unable to tolerate PO one day after discharge from [facility] without e/o dehydration or acidosis.    Plan:   #N/V and abdominal pain from gastroparesis likely 2/2 poorly controlled DM2 (less likely narcotic induced):   -lipase normal, normal WBC, s/p lap chole   -will try clear diet for now with ultimate plants to advance to non dairy puree   -continue IVF with NS+51mEqK @ 100cc/hr  - continue erythromycin 500mg  q6hr  -continue reglan 10mg  and carafate 4x daily when able to tolerate   -zofran, phenergan, prn nausea and scopolamine patch  -toradol prn for pain first, but also will give dilaudid IV for pain in the short term and stop once better    #HTN:   -will restart home lisinopril 40mg , hctz 25mg , and norvasc 5mg  today   -will give both Vasotec and Metoprolol IV prn  if he cannot handle his po medications listed above    #DM2:   -will increase Lantus to 32 units (his home dose is 36 units)   -low correctional insulin for now given npo status   -accuchecks q6h (have ranged from 134-197)    #FEN:  -will do clears trial today and ultimately will advance to non dairy pureed diet when ready for full po again    #PPX:   -lovenox, protonix IV     #Dispo: consulted CM 5/16 in terms of providing care coordination if possible and getting any assistance in obtaining medications prior to d/c to optimize compliance.      Subjective   Tolerated clear liquid diet but using dilaudid every 4 hours and doesn't feel like he can space this out anymore at  this time. Emesis throughout the day yesterday but improving toward the end of the day and this AM. No new complaints. BP still elevated but trending down. Able to walk in the halls.    Physical Exam:     VITAL SIGNS PHYSICAL EXAM   Temp:  [97.4 F (36.3 C)-98.7 F (37.1 C)] 97.6 F (36.4 C)  Heart Rate:  [17-95] 75   Resp Rate:  [18-20] 20   BP: (136-182)/(86-103) 156/92 mmHg      Intake/Output Summary (Last 24 hours) at 06/29/12 0926  Last data filed at 06/29/12 6644   Gross per 24 hour   Intake   1350 ml   Output      0 ml   Net   1350 ml    Physical Exam  Gen: nauseated but A&O x3   CV: RRR s1/s2 no m/r/g   Pulm: CTAB  Abd: soft, NTTP, diminished bowel sounds throughout   Ext: no edema         Meds:     Scheduled Meds:  Current Facility-Administered Medications   Medication Dose Route Frequency   . amLODIPine  5 mg Oral Daily   . enalaprilat  2.5 mg Intravenous Q6H SCH   . enoxaparin  40 mg Subcutaneous Daily   . erythromycin  500 mg  Intravenous Q6H SCH   . insulin glargine  30 Units Subcutaneous QHS   . metoclopramide  10 mg Intravenous Q6H SCH   . metoprolol  2.5 mg Intravenous Q6H SCH   . pantoprazole  40 mg Intravenous Daily   . scopolamine  1 patch Transdermal Q72H   . sucralfate  1 g Oral TID   . [DISCONTINUED] insulin glargine  25 Units Subcutaneous QHS   . [DISCONTINUED] labetalol  20 mg Intravenous BID   . [DISCONTINUED] metoprolol  10 mg Intravenous BID     Continuous Infusions:       . 0.9 % NaCl with KCl 20 mEq 100 mL/hr at 06/28/12 1831     PRN Meds:.dextrose, dextrose, glucagon (rDNA), HYDROmorphone, insulin aspart, ketorolac, ondansetron, promethazine      Labs:     Labs (last 72 hours):      Lab 06/29/12 0526 06/28/12 0538   WBC 7.32 7.52   HGB 13.5 13.6   HCT 41.1* 40.3*   PLT 279 281       No results found for this basename: PT:2,INR:2,PTT:2 in the last 168 hours   Lab 06/29/12 0526 06/28/12 0537 06/26/12 1914   NA 137 137 --   K 4.1 3.9 --   CL 103 103 --   CO2 26 24 --   BUN 10.0 10.0 --    CREAT 1.0 1.1 --   CA 8.5 8.2* --   ALB -- -- 3.9   PROT -- -- 7.2   BILITOTAL -- -- 1.2   ALKPHOS -- -- 88   ALT -- -- 38   AST -- -- 33   GLU 169* 204* --                 Signed by: Angelique Blonder, MD

## 2012-06-30 ENCOUNTER — Other Ambulatory Visit: Payer: Self-pay

## 2012-06-30 LAB — CBC
Hematocrit: 40.5 % — ABNORMAL LOW (ref 42.0–52.0)
Hgb: 13.5 g/dL (ref 13.0–17.0)
MCH: 26.7 pg — ABNORMAL LOW (ref 28.0–32.0)
MCHC: 33.3 g/dL (ref 32.0–36.0)
MCV: 80.2 fL (ref 80.0–100.0)
MPV: 10.3 fL (ref 9.4–12.3)
Nucleated RBC: 0 (ref 0–1)
Platelets: 265 (ref 140–400)
RBC: 5.05 (ref 4.70–6.00)
RDW: 13 % (ref 12–15)
WBC: 6.76 (ref 3.50–10.80)

## 2012-06-30 LAB — BASIC METABOLIC PANEL
BUN: 8 mg/dL — ABNORMAL LOW (ref 9.0–21.0)
CO2: 23 (ref 22–29)
Calcium: 8.4 mg/dL — ABNORMAL LOW (ref 8.5–10.5)
Chloride: 105 (ref 98–107)
Creatinine: 1 mg/dL (ref 0.7–1.3)
Glucose: 137 mg/dL — ABNORMAL HIGH (ref 70–100)
Potassium: 3.8 (ref 3.5–5.1)
Sodium: 137 (ref 136–145)

## 2012-06-30 LAB — POCT GLUCOSE
Whole Blood Glucose POCT: 79 mg/dL (ref 70–100)
Whole Blood Glucose POCT: 134 mg/dL — AB (ref 70–100)
Whole Blood Glucose POCT: 157 mg/dL — AB (ref 70–100)

## 2012-06-30 LAB — MAGNESIUM: Magnesium: 1.8 mg/dL (ref 1.6–2.6)

## 2012-06-30 LAB — GFR: EGFR: >60

## 2012-06-30 MED ORDER — ONDANSETRON HCL 8 MG PO TABS
8.0000 mg | ORAL_TABLET | Freq: Three times a day (TID) | ORAL | 0 refills | Status: DC | PRN
Start: 2012-06-30 — End: 2012-10-05
  Filled 2012-06-30: qty 10, 10d supply, fill #0

## 2012-06-30 MED ORDER — PANTOPRAZOLE SODIUM 40 MG PO TBEC
40.0000 mg | DELAYED_RELEASE_TABLET | Freq: Every day | ORAL | 0 refills | Status: DC
Start: 2012-06-30 — End: 2012-08-10
  Filled 2012-06-30: qty 30, 30d supply, fill #0

## 2012-06-30 MED ORDER — SCOPOLAMINE 1 MG/3DAYS TD PT72
1.0000 | MEDICATED_PATCH | TRANSDERMAL | 0 refills | Status: AC
Start: 2012-06-30 — End: 2012-07-30
  Filled 2012-06-30: qty 10, 30d supply, fill #0

## 2012-06-30 MED ORDER — ERYTHROMYCIN BASE 250 MG PO TABS
500.0000 mg | ORAL_TABLET | Freq: Four times a day (QID) | ORAL | 0 refills | Status: DC
Start: 2012-06-30 — End: 2012-06-30
  Filled 2012-06-30: qty 168, 21d supply, fill #0

## 2012-06-30 MED ORDER — ERYTHROMYCIN BASE 500 MG PO TABS
500.0000 mg | ORAL_TABLET | Freq: Four times a day (QID) | ORAL | Status: AC
Start: 2012-06-30 — End: 2012-07-21

## 2012-06-30 MED ORDER — LISINOPRIL 40 MG PO TABS
40.0000 mg | ORAL_TABLET | Freq: Every day | ORAL | 0 refills | Status: AC
Start: 2012-06-30 — End: 2012-07-30
  Filled 2012-06-30: qty 30, 30d supply, fill #0

## 2012-06-30 MED ORDER — SUCRALFATE 1 G PO TABS
1.0000 g | ORAL_TABLET | Freq: Three times a day (TID) | ORAL | 0 refills | Status: DC
Start: 2012-06-30 — End: 2012-08-10
  Filled 2012-06-30: qty 90, 30d supply, fill #0

## 2012-06-30 MED ORDER — AMLODIPINE BESYLATE 5 MG PO TABS
5.0000 mg | ORAL_TABLET | Freq: Every day | ORAL | 0 refills | Status: DC
Start: 2012-06-30 — End: 2012-08-10
  Filled 2012-06-30: qty 30, 30d supply, fill #0

## 2012-06-30 NOTE — ED Notes (Signed)
EKG AUDIT DONE; CONFIRMED ORDER ON CHART; CONFIRMED COPY IN MUSE

## 2012-06-30 NOTE — Progress Notes (Signed)
Pt waiting to be discharged. Would like to have Norvasc, Reglan, protonix and Carafate filled while in the hospital due to difficulty filling them outside hospital. Paged Dr. Myrtice Lauth x2, no response. Will continue to try and contact.

## 2012-06-30 NOTE — Discharge Instructions (Signed)
Gastroparesis    Gastroparesis means that it takes longer than usual for food to move from your stomach into your intestines. It happens when the nerves that control the stomach muscles are damaged. The nerve damage could be caused by diabetes, surgery to the stomach or intestines, or by some medicines. Sometimes we cannot figure out the cause.    Gastroparesis is usually a chronic condition. This means treatment is very unlikely to cure it. Instead, your doctor can help you manage the symptoms.    Gastroparesis is usually a chronic condition. This means treatment is very unlikely to cure it. Instead, your doctor can help you manage the symptoms.    Symptoms of gastroparesis include:   Abdominal (belly) pain or cramping, especially in the upper abdomen (belly).   Nausea or vomiting.   Feeling full after eating only a small amount of food.   Heartburn or acid reflux.   High or low blood sugars which are hard to control.    To feel better, try to:   Drink more liquids and eat soft foods.   Drink plenty of water.   Eat smaller, more frequent meals.   Avoid high-fat foods.   Avoid high fiber foods.    Follow up with your primary care doctor or your stomach specialist.    YOU SHOULD SEEK MEDICAL ATTENTION IMMEDIATELY, EITHER HERE OR AT THE NEAREST EMERGENCY DEPARTMENT, IF ANY OF THE FOLLOWING OCCUR:   Your pain changes or gets worse.   You have problems eating or drinking.   You have vomiting that won't stop.   Your symptoms get worse or you have any other problems or concerns.        MEDICATION: ZOFRAN  Zofran (generic name is ondansetron) is used to treat nausea and vomiting.  DIRECTIONS FOR USE:You may take this drug with or without food. Take as directed by your doctor.  WHAT TO WATCH FOR:  POSSIBLE SIDE EFFECTS: Diarrhea or constipation, headache, lightheaded, drowsy, blurred vision (If these symptoms persist or worsen contact your doctor). Chest pain (Contact your doctor or go to the Emergency  Department promptly).  ALLERGIC REACTIONS: Rash, itching, swelling, trouble swallowing or breathing --> (Contact your doctor or go to the Emergency Department promptly.)  DRUG INTERACTIONS: Before starting this medicine, be sure your doctor knows if you are taking any of the following drugs:  -- none  WARNINGS:  -- May cause drowsiness. DO NOT DRIVE, ride a bicycle or operate dangerous equipment while taking this medicine until you know how it will affect you.  -- Avoid alcohol use while taking this medicine. Side effects may increase with alcohol.  [NOTE: This information topic may not include all directions, precautions, medical conditions, drug/food interactions and warnings for this drug. Check with your doctor, nurse or pharmacist for any questions that you may have.]   [address], [address] [place], [place] [address]. All rights reserved. This information is not intended as a substitute for professional medical care. Always follow your healthcare professional's instructions.  Erythromycin Oral tablet  What is this medicine?  ERYTHROMYCIN (er ith roe MYE sin) is a macrolide antibiotic. It is used to treat certain kinds of bacterial infections. It will not work for colds, flu, or other viral infections.  This medicine may be used for other purposes; ask your health care provider or pharmacist if you have questions.  What should I tell my health care provider before I take this medicine?  They need to know if  you have any of these conditions:   liver disease   myasthenia gravis   an unusual or allergic reaction to erythromycin, other medicines, foods, dyes, or preservatives   pregnant or trying to get pregnant   breast-feeding  How should I use this medicine?  Take this medicine by mouth with glass of water. Follow the directions on the prescription label. Take this medicine on an empty stomach, at least 30 minutes before or 2 hours after food. If this medicine upsets your stomach, take  with food or milk. Take your medicine at regular intervals. Do not take your medicine more often than directed. Do not skip doses or stop your medicine early even if you feel better. Do not stop taking except on your doctor's advice.  Talk to your pediatrician regarding the use of this medicine in children. Special care may be needed.  Overdosage: If you think you have taken too much of this medicine contact a poison control center or emergency room at once.  NOTE: This medicine is only for you. Do not share this medicine with others.  What if I miss a dose?  If you miss a dose, take it as soon as you can. If it is almost time for your next dose, take only that dose. Do not take double or extra doses.  What may interact with this medicine?  Do not take this medicine with any of the following medications:   chloroquine   cisapride   droperidol   eplerenone   ergotamine and dihydroergotamine   methadone   other antibiotics, like grepafloxacin or sparfloxacin   sirolimus   some medicines for cholesterol like atorvastatin, cerivastatin, lovastatin, and simvastatin   some medicines for heart rhythm problems   some medicines for psychotic disturbances   vinblastine   red yeast rice  This medicine may also interact with the following medications:   alfentanil   bromocriptine   carbamazepine   cyclosporine   digoxin   some medicines for anxiety or difficulty sleeping   phenytoin   terfenadine   theophylline   valproate   warfarin  This list may not describe all possible interactions. Give your health care provider a list of all the medicines, herbs, non-prescription drugs, or dietary supplements you use. Also tell them if you smoke, drink alcohol, or use illegal drugs. Some items may interact with your medicine.  What should I watch for while using this medicine?  Tell your doctor or health care professional if your symptoms do not improve or if they get worse.  Do not treat diarrhea with over the  counter products. Contact your doctor if you have diarrhea that lasts more than 2 days or if it is severe and watery.  What side effects may I notice from receiving this medicine?  Side effects that you should report to your doctor or health care professional as soon as possible:   allergic reactions like skin rash, itching or hives, swelling of the face, lips, or tongue   dark urine   difficulty breathing   hearing loss   irregular heartbeat or chest pain   redness, blistering, peeling or loosening of the skin, including inside the mouth   severe or watery diarrhea   unusually weak or tired   yellowing of eyes or skin  Side effects that usually do not require medical attention (report to your doctor or health care professional if they continue or are bothersome):   diarrhea   loss of appetite  nausea, vomiting   stomach pain  This list may not describe all possible side effects. Call your doctor for medical advice about side effects. You may report side effects to FDA at 1-800-FDA-1088.  Where should I keep my medicine?  Keep out of the reach of children.  Store at room temperature between 15 and 30 degrees C (59 and 86 degrees F). Keep container tightly closed. Throw away any unused medicine after the expiration date.  NOTE:This sheet is a summary. It may not cover all possible information. If you have questions about this medicine, talk to your doctor, pharmacist, or health care provider. Copyright 2014 [employer] Standard

## 2012-06-30 NOTE — Progress Notes (Signed)
Assessment performed and VSS. AOx4. Hypertensive with NSR on tele in the 90's. Pt denies any dizziness, N&V. Mild +1 pitting edema on LLE noted. Medicated with prn pain med with some relief obtained.  Pt is resting in bed with abx infusing at this time. Will continue to monitor per plan of care.

## 2012-06-30 NOTE — ED Provider Notes (Signed)
Attg Note:    37 yo M with h/o DM, gastroparesis, to ED for abd pain, N/V. D/s yesterday from outside hospital for DKA. Mild abd pain. No fevers.    PE: VSS  axox3  s1s2  cta b/l  Soft, mild epigastric ttp  No c/c/e    A/P: 37 yo M with N/V, abd pain  --labs, IVF, anti-emetics  --re-eval    Latanya Presser, MD  06/30/12 1710

## 2012-06-30 NOTE — Discharge Summary (Signed)
[facility] Attending Note    Patient seen and evaluated on rounds.   Agree with physical exam findings, assessment and plan of Dr.Yi with the following additions and exceptions:   Patient is feeling much better today with decreased abdominal pain.  Decreased abdominal tenderness on exam.  Able to tolerate PO.  Ok for discharge with planned follow up.        Brunilda Payor, MD  [facility]  Physician ID: [id number]  [address]   [place], [place] [address]  P: [phone]  F: [phone]

## 2012-06-30 NOTE — Progress Notes (Signed)
Case Management Initial Discharge Planning Assessment    Psychosocial/Demographic Information     Who was interviewed, relationship, best contact information - Patient    Pt lives with - Girlfriend    Type of residence - Apartment    Prior level of functioning - Independent    Insurance status, medication coverage, community connections- (SNAP, TANF, mental health counseling etc.)   OUT OF STATE BCBS  Any additional emergency contacts?       DME, SNF, Home Care Companies    What DME is currently owned and used? None    Has the patient been to a SNF in the past?  If so, where? No    Any home care companies- oxygen, DME, skilled nursing, home health aides, how often do they visit? - No    Does this patient already have community dialysis set up?  If so, where? No    Palliative care or hospice involvement - No    Advanced directives on the chart? No    Healthcare Decision Maker and relationship to patient - Self    Geriatrics consult needed? No    Elderlink referral needed? No    PACE referral needed? No    Discharge Needs:  None      Potential Barriers to Discharge: None    Discussed Anticipated Discharge Date and Discharge Disposition Possibilities with: 06/30/12 home. Patient to drive himself home.       [name], MSW  Clinical Social Worker  [place], [place]  [phone]

## 2012-06-30 NOTE — Progress Notes (Addendum)
Attempted to contact Family medicine attending and team, office number listed and no answer. Will attempt to contact GI for advance diet order. Family medicine team on floor and meeting with patient.

## 2012-06-30 NOTE — Discharge Summary -  Nursing (Signed)
Pt and vital signs stable throughout shift. No complaints of pain, N/V. Pt up ad lib to restroom and ambulating in hall. NSR on tele. Pharmacy to check insurance coverage of medications prior to discharge. Shift uneventful. Will continue to monitor.

## 2012-08-03 ENCOUNTER — Emergency Department: Payer: BC Managed Care – PPO

## 2012-08-03 ENCOUNTER — Emergency Department
Admission: EM | Admit: 2012-08-03 | Discharge: 2012-08-04 | Disposition: A | Discharge status: Home / Self Care | Payer: BC Managed Care – PPO | Attending: Emergency Medicine | Admitting: Emergency Medicine

## 2012-08-03 DIAGNOSIS — K3184 Gastroparesis: Secondary | ICD-10-CM | POA: Insufficient documentation

## 2012-08-03 DIAGNOSIS — I10 Essential (primary) hypertension: Secondary | ICD-10-CM | POA: Insufficient documentation

## 2012-08-03 DIAGNOSIS — Z794 Long term (current) use of insulin: Secondary | ICD-10-CM | POA: Insufficient documentation

## 2012-08-03 DIAGNOSIS — E119 Type 2 diabetes mellitus without complications: Secondary | ICD-10-CM | POA: Insufficient documentation

## 2012-08-03 LAB — COMPREHENSIVE METABOLIC PANEL
ALT: 15 U/L (ref 0–55)
AST (SGOT): 12 U/L (ref 5–34)
Albumin/Globulin Ratio: 1.1 (ref 0.9–2.2)
Albumin: 3.6 g/dL (ref 3.5–5.0)
Alkaline Phosphatase: 78 U/L (ref 40–150)
BUN: 9 mg/dL (ref 9.0–21.0)
Bilirubin, Total: 0.6 mg/dL (ref 0.2–1.2)
CO2: 24 (ref 22–29)
Calcium: 9 mg/dL (ref 8.5–10.5)
Chloride: 102 (ref 98–107)
Creatinine: 1.1 mg/dL (ref 0.7–1.3)
Globulin: 3.3 g/dL (ref 2.0–3.6)
Glucose: 247 mg/dL — ABNORMAL HIGH (ref 70–100)
Potassium: 4.1 (ref 3.5–5.1)
Protein, Total: 6.9 g/dL (ref 6.0–8.3)
Sodium: 140 (ref 136–145)

## 2012-08-03 LAB — CBC AND DIFFERENTIAL
Basophils Absolute Automated: 0.02 (ref 0.00–0.20)
Basophils Automated: 0 %
Eosinophils Absolute Automated: 0.01 (ref 0.00–0.70)
Eosinophils Automated: 0 %
Hematocrit: 40 % — ABNORMAL LOW (ref 42.0–52.0)
Hgb: 13.2 g/dL (ref 13.0–17.0)
Immature Granulocytes Absolute: 0.02
Immature Granulocytes: 0 %
Lymphocytes Absolute Automated: 1.29 (ref 0.50–4.40)
Lymphocytes Automated: 14 %
MCH: 27.2 pg — ABNORMAL LOW (ref 28.0–32.0)
MCHC: 33 g/dL (ref 32.0–36.0)
MCV: 82.3 fL (ref 80.0–100.0)
MPV: 9.6 fL (ref 9.4–12.3)
Monocytes Absolute Automated: 0.32 (ref 0.00–1.20)
Monocytes: 3 %
Neutrophils Absolute: 7.72 (ref 1.80–8.10)
Neutrophils: 82 %
Nucleated RBC: 0 (ref 0–1)
Platelets: 277 (ref 140–400)
RBC: 4.86 (ref 4.70–6.00)
RDW: 13 % (ref 12–15)
WBC: 9.38 (ref 3.50–10.80)

## 2012-08-03 LAB — LIPASE: Lipase: 28 U/L (ref 8–78)

## 2012-08-03 LAB — GFR: EGFR: >60

## 2012-08-03 LAB — POCT GLUCOSE: Whole Blood Glucose POCT: 197 mg/dL — AB (ref 70–100)

## 2012-08-03 MED ORDER — HYDROMORPHONE HCL PF 1 MG/ML IJ SOLN
1.0000 mg | Freq: Once | INTRAMUSCULAR | Status: AC
Start: 2012-08-03 — End: 2012-08-04
  Administered 2012-08-04: 1 mg via INTRAVENOUS
  Filled 2012-08-03: qty 1

## 2012-08-03 MED ORDER — ONDANSETRON HCL 4 MG/2ML IJ SOLN
4.00 mg | Freq: Once | INTRAMUSCULAR | Status: AC
Start: 2012-08-03 — End: 2012-08-03
  Administered 2012-08-03: 4 mg via INTRAVENOUS
  Filled 2012-08-03: qty 2

## 2012-08-03 MED ORDER — SODIUM CHLORIDE 0.9 % IV BOLUS
1000.0000 mL | Freq: Once | INTRAVENOUS | Status: AC
Start: 2012-08-03 — End: 2012-08-04
  Administered 2012-08-03: 1000 mL via INTRAVENOUS

## 2012-08-03 NOTE — ED Notes (Signed)
Pt states he has diabetes and gastroparesis and that he has been throwing up all day. Pt reports last blood sugar was 206 at 1600. Pt reports this feels like one of his exacerbations and that he usually comes in to get Dilaudid, fluids, and zofran. Denies diarrhea or fever. Denies any pain with urination.

## 2012-08-03 NOTE — ED Provider Notes (Signed)
Physician/Midlevel provider first contact with patient: 08/03/12 2311             EMERGENCY DEPARTMENT HISTORY AND PHYSICAL EXAM    Date: 08/04/2012  Patient Name: [name],[name]      Disposition and Treatment Plan    Clinical Impression:   1. Gastroparesis      Disposition: Discharge to home       History of Presenting Illness     Chief Complaint   Patient presents with   . Abdominal Pain   . Nausea     Context: home  Location: abd  Duration: Onset this morning  Quality: aching  Timing: persistent  Maximum Severity: moderate  Modifying Factors: see additional history  History Obtained From: patient  Additional History: [name] is a 37 y.o. male h/o gastroparesis c/o gradually onset abd pain and vomiting all day today. Pt reports that sxs feel like his past gastroparesis exacerbations. No diarrhea, fever.    PCP: [name], MD    Current facility-administered medications:[COMPLETED] HYDROmorphone (DILAUDID) injection 1 mg, 1 mg, Intravenous, Once, Latanya Presser, MD, 1 mg at 08/04/12 0011;  [COMPLETED] HYDROmorphone (DILAUDID) injection 1 mg, 1 mg, Intravenous, Once, Latanya Presser, MD, 1 mg at 08/04/12 0227;  [COMPLETED] ondansetron [facility]) injection 4 mg, 4 mg, Intravenous, Once, Latanya Presser, MD, 4 mg at 08/03/12 2306  [COMPLETED] promethazine (PHENERGAN) injection 12.5 mg, 12.5 mg, Intravenous, Once, Latanya Presser, MD, 12.5 mg at 08/04/12 0056;  [COMPLETED] sodium chloride 0.9 % bolus 1,000 mL, 1,000 mL, Intravenous, Once, Latanya Presser, MD, 1,000 mL at 08/03/12 2306  Current outpatient prescriptions:amLODIPine (NORVASC) 5 MG tablet, Take 1 tablet (5 mg total) by mouth daily., Disp: 30 tablet, Rfl: 0;  insulin aspart (NOVOLOG) 100 UNIT/ML injection, Inject 1-8 Units into the skin 4 times daily - with meals and at bedtime., Disp: 10 mL, Rfl: 0;  insulin glargine (LANTUS) 100 UNIT/ML injection, Inject 36 Units into the skin nightly., Disp: 1 pen, Rfl: 2  ondansetron (ZOFRAN ODT) 4 MG  disintegrating tablet, Take 1 tablet (4 mg total) by mouth every 6 (six) hours as needed for Nausea., Disp: 8 tablet, Rfl: 0;  ondansetron (ZOFRAN) 8 MG tablet, Take 1 tablet (8 mg total) by mouth every 8 (eight) hours as needed for Nausea., Disp: 20 tablet, Rfl: 0;  pantoprazole (PROTONIX) 40 MG tablet, Take 1 tablet (40 mg total) by mouth daily., Disp: 30 tablet, Rfl: 0  sucralfate (CARAFATE) 1 G tablet, Take 1 tablet (1 g total) by mouth 3 (three) times daily., Disp: 90 tablet, Rfl: 0    Past Medical History     Past Medical History   Diagnosis Date   . Diabetes    . Hypertensive disorder    . Gastroparesis      Past Surgical History   Procedure Date   . Cholecystectomy        Family History     History reviewed. No pertinent family history.    Social History     History     Social History   . Marital Status: Single     Spouse Name: N/A     Number of Children: N/A   . Years of Education: N/A     Social History Main Topics   . Smoking status: Never Smoker    . Smokeless tobacco: Never Used   . Alcohol Use: No   . Drug Use: No   . Sexually Active: Not on file  Other Topics Concern   . Not on file     Social History Narrative   . No narrative on file       Allergies     No Known Allergies    Review of Systems     Positive and negative ROS elements as per HPI.  All Other Systems Reviewed and Negative: Yes    Physical Exam   BP 178/90  Pulse 92  Temp 98.4 F (36.9 C) (Oral)  Resp 16  SpO2 100%    Physical Exam        Diagnostic Study Results   Labs -  Labs Reviewed   POCT GLUCOSE - Abnormal; Notable for the following:     POCT Glucose WB 197 (*)  rn aware    All other components within normal limits   CBC AND DIFFERENTIAL - Abnormal; Notable for the following:     Hematocrit 40.0 (*)      MCH 27.2 (*)      All other components within normal limits   COMPREHENSIVE METABOLIC PANEL - Abnormal; Notable for the following:     Glucose 247 (*)      All other components within normal limits   URINALYSIS WITH  MICROSCOPIC - Abnormal; Notable for the following:     Protein, UA 30 (*)      Glucose, UA >1000 (*)      Ketones UA 40 (*)      RBC, UA 6 - 10 (*)      All other components within normal limits   LIPASE   GFR       Radiologic Studies -            Clinical Course in the Emergency Department   Pt with h/o gastroparesis, here for same. Labs ok, AG 14, no DKA. Feels better after IV/pain meds/anti-emetics. Tolerating po. Will d/c on zofran and PMD f/u, to return to ER for any worsening sxs.       Medical Decision Making     I am the first provider for this patient.  I reviewed the vital signs, nursing notes, past medical history, past surgical history, family history and social history.      Vital Signs -   Patient Vitals for the past 12 hrs:   BP Temp Pulse Resp   08/04/12 0344 178/90 mmHg - 92  16    08/04/12 0232 169/89 mmHg 98.4 F (36.9 C) 91  16    08/03/12 2232 192/99 mmHg 97.7 F (36.5 C) 101  18        Pulse Oximetry Analysis - Normal    Differential Diagnosis (not completely inclusive): gastroparesis vs pancreatitis vs gastritis    Laboratory results reviewed by EDP: yes  Radiologic study results reviewed by EDP: N\A  Radiologic Studies Interpreted (viewed) by EDP: N\A        _______________________________    Attestations:  I was acting as a Neurosurgeon for Latanya Presser, MD on [name]  Treatment Team: Scribe: Delice Lesch     I am the first provider for this patient and I personally performed the services documented. Treatment Team: Scribe: Delice Lesch is scribing for me on [name],[name]. This note accurately reflects work and decisions made by me.  Latanya Presser, MD  ______________________________          Latanya Presser, MD  08/04/12 [phone]

## 2012-08-04 ENCOUNTER — Inpatient Hospital Stay
Admission: EM | Admit: 2012-08-04 | Discharge: 2012-08-10 | DRG: 179 | Disposition: A | Discharge status: Home / Self Care | Payer: BC Managed Care – PPO | Attending: Family Medicine | Admitting: Family Medicine

## 2012-08-04 ENCOUNTER — Inpatient Hospital Stay: Payer: BC Managed Care – PPO | Admitting: Internal Medicine

## 2012-08-04 ENCOUNTER — Emergency Department: Payer: BC Managed Care – PPO

## 2012-08-04 DIAGNOSIS — K219 Gastro-esophageal reflux disease without esophagitis: Secondary | ICD-10-CM | POA: Diagnosis present

## 2012-08-04 DIAGNOSIS — K3184 Gastroparesis: Secondary | ICD-10-CM | POA: Diagnosis present

## 2012-08-04 DIAGNOSIS — E860 Dehydration: Secondary | ICD-10-CM | POA: Diagnosis present

## 2012-08-04 DIAGNOSIS — I10 Essential (primary) hypertension: Secondary | ICD-10-CM | POA: Diagnosis present

## 2012-08-04 DIAGNOSIS — K209 Esophagitis, unspecified without bleeding: Secondary | ICD-10-CM | POA: Diagnosis present

## 2012-08-04 DIAGNOSIS — E1142 Type 2 diabetes mellitus with diabetic polyneuropathy: Secondary | ICD-10-CM | POA: Diagnosis present

## 2012-08-04 DIAGNOSIS — J690 Pneumonitis due to inhalation of food and vomit: Principal | ICD-10-CM | POA: Diagnosis present

## 2012-08-04 DIAGNOSIS — E1149 Type 2 diabetes mellitus with other diabetic neurological complication: Secondary | ICD-10-CM | POA: Diagnosis present

## 2012-08-04 DIAGNOSIS — Z91199 Patient's noncompliance with other medical treatment and regimen due to unspecified reason: Secondary | ICD-10-CM

## 2012-08-04 DIAGNOSIS — K2940 Chronic atrophic gastritis without bleeding: Secondary | ICD-10-CM | POA: Diagnosis present

## 2012-08-04 LAB — URINALYSIS
Glucose, UA: 500 — AB
Ketones UA: 40 — AB
Leukocyte Esterase, UA: NEGATIVE
Nitrite, UA: NEGATIVE
Protein, UR: 30 — AB
Specific Gravity UA: >1.03 (ref 1.001–1.035)
Urine pH: 6 (ref 5.0–8.0)
Urobilinogen, UA: 1 mg/dL (ref 0.2–2.0)

## 2012-08-04 LAB — COMPREHENSIVE METABOLIC PANEL
ALT: 14 U/L (ref 0–55)
AST (SGOT): 13 U/L (ref 5–34)
Albumin/Globulin Ratio: 1 (ref 0.9–2.2)
Albumin: 3.8 g/dL (ref 3.5–5.0)
Alkaline Phosphatase: 80 U/L (ref 40–150)
Anion Gap: 15 (ref 5.0–15.0)
BUN: 10.4 mg/dL (ref 9.0–21.0)
Bilirubin, Total: 0.6 mg/dL (ref 0.2–1.2)
CO2: 25 (ref 22–29)
Calcium: 9.4 mg/dL (ref 8.5–10.5)
Chloride: 101 (ref 98–107)
Creatinine: 1.3 mg/dL (ref 0.7–1.3)
Globulin: 3.7 g/dL — ABNORMAL HIGH (ref 2.0–3.6)
Glucose: 225 mg/dL — ABNORMAL HIGH (ref 70–100)
Potassium: 3.9 (ref 3.5–5.1)
Protein, Total: 7.5 g/dL (ref 6.0–8.3)
Sodium: 141 (ref 136–145)

## 2012-08-04 LAB — CBC AND DIFFERENTIAL
Basophils Absolute Automated: 0.02 (ref 0.00–0.20)
Basophils Automated: 0 %
Eosinophils Absolute Automated: 0.01 (ref 0.00–0.70)
Eosinophils Automated: 0 %
Hematocrit: 41.6 % — ABNORMAL LOW (ref 42.0–52.0)
Hgb: 13.8 g/dL (ref 13.0–17.0)
Immature Granulocytes Absolute: 0.04
Immature Granulocytes: 1 %
Lymphocytes Absolute Automated: 1.26 (ref 0.50–4.40)
Lymphocytes Automated: 14 %
MCH: 27.2 pg — ABNORMAL LOW (ref 28.0–32.0)
MCHC: 33.2 g/dL (ref 32.0–36.0)
MCV: 81.9 fL (ref 80.0–100.0)
MPV: 9.2 fL — ABNORMAL LOW (ref 9.4–12.3)
Monocytes Absolute Automated: 0.54 (ref 0.00–1.20)
Monocytes: 6 %
Neutrophils Absolute: 6.95 (ref 1.80–8.10)
Neutrophils: 79 %
Platelets: 302 (ref 140–400)
RBC: 5.08 (ref 4.70–6.00)
RDW: 13 % (ref 12–15)
WBC: 8.78 (ref 3.50–10.80)

## 2012-08-04 LAB — URINALYSIS WITH MICROSCOPIC
Bilirubin, UA: NEGATIVE
Blood, UA: NEGATIVE
Glucose, UA: >1000 — AB
Ketones UA: 40 — AB
Leukocyte Esterase, UA: NEGATIVE
Nitrite, UA: NEGATIVE
Protein, UR: 30 — AB
Specific Gravity UA: 1.022 (ref 1.001–1.035)
Urine pH: 6.5 (ref 5.0–8.0)
Urobilinogen, UA: 2 mg/dL (ref 0.2–2.0)

## 2012-08-04 LAB — URINE MICROSCOPIC

## 2012-08-04 LAB — GFR: EGFR: >60

## 2012-08-04 LAB — LIPASE: Lipase: 29 U/L (ref 8–78)

## 2012-08-04 MED ORDER — LORAZEPAM 2 MG/ML IJ SOLN
1.0000 mg | Freq: Once | INTRAMUSCULAR | Status: AC
Start: 2012-08-04 — End: 2012-08-04
  Administered 2012-08-04: 1 mg via INTRAVENOUS
  Filled 2012-08-04: qty 1

## 2012-08-04 MED ORDER — SODIUM CHLORIDE 0.9 % IV BOLUS
1000.0000 mL | Freq: Once | INTRAVENOUS | Status: AC
Start: 2012-08-04 — End: 2012-08-04
  Administered 2012-08-04: 1000 mL via INTRAVENOUS

## 2012-08-04 MED ORDER — HYDROMORPHONE HCL PF 1 MG/ML IJ SOLN
1.00 mg | Freq: Once | INTRAMUSCULAR | Status: AC
Start: 2012-08-04 — End: 2012-08-04
  Administered 2012-08-04: 1 mg via INTRAVENOUS
  Filled 2012-08-04: qty 1

## 2012-08-04 MED ORDER — PANTOPRAZOLE SODIUM 40 MG IV SOLR
40.0000 mg | Freq: Once | INTRAVENOUS | Status: AC
Start: 2012-08-04 — End: 2012-08-04
  Administered 2012-08-04: 40 mg via INTRAVENOUS
  Filled 2012-08-04: qty 40

## 2012-08-04 MED ORDER — SODIUM CHLORIDE 0.9 % IV MBP
4.5000 g | Freq: Once | INTRAVENOUS | Status: AC
Start: 2012-08-04 — End: 2012-08-05
  Administered 2012-08-05: 4.5 g via INTRAVENOUS
  Filled 2012-08-04: qty 4000
  Filled 2012-08-04: qty 100

## 2012-08-04 MED ORDER — ONDANSETRON 4 MG PO TBDP
4.00 mg | ORAL_TABLET | Freq: Four times a day (QID) | ORAL | Status: DC | PRN
Start: 2012-08-04 — End: 2012-10-05

## 2012-08-04 MED ORDER — HYDROMORPHONE HCL PF 1 MG/ML IJ SOLN
1.0000 mg | Freq: Once | INTRAMUSCULAR | Status: AC
Start: 2012-08-04 — End: 2012-08-04
  Administered 2012-08-04: 1 mg via INTRAVENOUS
  Filled 2012-08-04: qty 1

## 2012-08-04 MED ORDER — PROMETHAZINE HCL 25 MG/ML IJ SOLN
INTRAMUSCULAR | Status: AC
Start: 2012-08-04 — End: 2012-08-04
  Administered 2012-08-04: 12.5 mg via INTRAVENOUS
  Filled 2012-08-04: qty 1

## 2012-08-04 MED ORDER — LEVOFLOXACIN IN D5W 750 MG/150ML IV SOLN
750.0000 mg | Freq: Once | INTRAVENOUS | Status: AC
Start: 2012-08-04 — End: 2012-08-05
  Administered 2012-08-04: 750 mg via INTRAVENOUS
  Filled 2012-08-04: qty 150

## 2012-08-04 MED ORDER — PROMETHAZINE HCL 25 MG/ML IJ SOLN
12.5000 mg | Freq: Once | INTRAMUSCULAR | Status: AC
Start: 2012-08-04 — End: 2012-08-04
  Administered 2012-08-04: 12.5 mg via INTRAVENOUS
  Filled 2012-08-04: qty 1

## 2012-08-04 MED ORDER — PROMETHAZINE HCL 25 MG/ML IJ SOLN
12.5000 mg | Freq: Once | INTRAMUSCULAR | Status: AC
Start: 2012-08-04 — End: 2012-08-04

## 2012-08-04 MED ORDER — FAMOTIDINE 10 MG/ML IV SOLN (WRAP)
20.0000 mg | Freq: Once | INTRAVENOUS | Status: DC
Start: 2012-08-04 — End: 2012-08-04

## 2012-08-04 MED ORDER — ONDANSETRON HCL 4 MG/2ML IJ SOLN
4.0000 mg | Freq: Once | INTRAMUSCULAR | Status: AC
Start: 2012-08-04 — End: 2012-08-04
  Administered 2012-08-04: 4 mg via INTRAVENOUS
  Filled 2012-08-04: qty 2

## 2012-08-04 NOTE — ED Provider Notes (Addendum)
Physician/Midlevel provider first contact with patient: 08/04/12 1831         History     Chief Complaint   Patient presents with   . Abdominal Pain     HPI Comments: Hx gastroparesis, seen at [facility] ED last night for epigastric pain, vomiting, followed up with PMD today who sent in for admission due to intractable vomiting.    Patient is a 37 y.o. male presenting with abdominal pain. The history is provided by the patient.   Abdominal Pain  The primary symptoms of the illness include abdominal pain. The primary symptoms of the illness do not include fever or shortness of breath.   Symptoms associated with the illness do not include chills.       Past Medical History   Diagnosis Date   . Diabetes    . Hypertensive disorder    . Gastroparesis        Past Surgical History   Procedure Date   . Cholecystectomy        No family history on file.    Social  History   Substance Use Topics   . Smoking status: Never Smoker    . Smokeless tobacco: Never Used   . Alcohol Use: No       .     No Known Allergies    Current/Home Medications    AMLODIPINE (NORVASC) 5 MG TABLET    Take 1 tablet (5 mg total) by mouth daily.    ERYTHROMYCIN BASE PO    Take by mouth.    INSULIN ASPART (NOVOLOG) 100 UNIT/ML INJECTION    Inject 1-8 Units into the skin 4 times daily - with meals and at bedtime.    INSULIN GLARGINE (LANTUS) 100 UNIT/ML INJECTION    Inject 36 Units into the skin nightly.    ONDANSETRON (ZOFRAN ODT) 4 MG DISINTEGRATING TABLET    Take 1 tablet (4 mg total) by mouth every 6 (six) hours as needed for Nausea.    ONDANSETRON (ZOFRAN) 8 MG TABLET    Take 1 tablet (8 mg total) by mouth every 8 (eight) hours as needed for Nausea.    PANTOPRAZOLE (PROTONIX) 40 MG TABLET    Take 1 tablet (40 mg total) by mouth daily.    SUCRALFATE (CARAFATE) 1 G TABLET    Take 1 tablet (1 g total) by mouth 3 (three) times daily.        Review of Systems   Constitutional: Negative for fever and chills.   Respiratory: Negative for shortness of breath.     Cardiovascular: Negative for chest pain.   Gastrointestinal: Positive for abdominal pain. Negative for blood in stool.   All other systems reviewed and are negative.        Physical Exam    BP 172/109  Pulse 107  Temp 98.8 F (37.1 C)  Resp 18  Ht 1.905 m  Wt 127.007 kg  BMI 35.00 kg/m2  SpO2 97%    Physical Exam   Nursing note and vitals reviewed.  Constitutional: He is oriented to person, place, and time. He appears well-developed and well-nourished. He appears distressed.   HENT:   Head: Normocephalic and atraumatic.   Eyes: Conjunctivae normal are normal.   Neck: No JVD present.   Cardiovascular:        Tachycardic   Pulmonary/Chest: Effort normal and breath sounds normal.   Abdominal: Soft. He exhibits no distension and no mass. There is no rebound and no guarding.  Epigastric tenderness   Musculoskeletal: He exhibits no edema.   Neurological: He is alert and oriented to person, place, and time.   Skin: Skin is warm and dry.   Psychiatric: His behavior is normal.       MDM and ED Course     ED Medication Orders      Start     Status Ordering Provider    08/04/12 1854   pantoprazole (PROTONIX) injection 40 mg   Once      Route: Intravenous  Ordered Dose: 40 mg         Leigh Aurora IRVIN IV    08/04/12 1852   ondansetron (ZOFRAN) injection 4 mg   Once      Route: Intravenous  Ordered Dose: 4 mg         Leigh Aurora IRVIN IV    08/04/12 1852   promethazine (PHENERGAN) injection 12.5 mg   Once      Route: Intravenous  Ordered Dose: 12.5 mg         Leigh Aurora Page IV    08/04/12 1852      Once,   Status:  Discontinued      Route: Intravenous  Ordered Dose: 20 mg         Discontinued Ree Edman IRVIN IV    08/04/12 1833   sodium chloride 0.9 % bolus 1,000 mL   Once      Route: Intravenous  Ordered Dose: 1,000 mL         Ordered Kendry Pfarr IRVIN IV                 MDM  Number of Diagnoses or Management Options  Intractable vomiting:   Pneumonia:   Diagnosis management  comments: Nursing history reviewed    O2 Sat 95-100% on RA, normal    Intractable abd pain and vomiting despite IV analgesics, antiemetics, fluids requiring admission        Procedures    Clinical Impression & Disposition     Clinical Impression  Final diagnoses:   Pneumonia   Intractable vomiting        ED Disposition     Admit Bed Type: General [8]  Admitting Physician: Darrin Nipper  Patient Class: Observation [104]             New Prescriptions    No medications on file               Glyn Ade, MD  08/08/12 1451    Glyn Ade, MD  09/15/12 [phone]

## 2012-08-04 NOTE — Discharge Instructions (Signed)
Stay well hydrated.  Take zofran for nausea.  Continue insulin as prescribed.  Follow up with your Primary Care Doctor  Return to the ER for any worsening symptoms.

## 2012-08-04 NOTE — ED Notes (Signed)
Pt CO chills, epigastric and RUQ pain since 08/03/12. Pt seen at [facility] yesterday, dx with gastroparesis. Pt sent home this morning at 3 am. Pt sent by PCP today after his symptoms worsened. Pt vomiting bile and bloody emisis.

## 2012-08-05 ENCOUNTER — Encounter: Payer: Self-pay | Admitting: Nurse Practitioner

## 2012-08-05 ENCOUNTER — Inpatient Hospital Stay: Payer: BC Managed Care – PPO

## 2012-08-05 LAB — CBC AND DIFFERENTIAL
Basophils Absolute Automated: 0.02 (ref 0.00–0.20)
Basophils Automated: 0 %
Eosinophils Absolute Automated: 0.02 (ref 0.00–0.70)
Eosinophils Automated: 0 %
Hematocrit: 37.5 % — ABNORMAL LOW (ref 42.0–52.0)
Hgb: 12.3 g/dL — ABNORMAL LOW (ref 13.0–17.0)
Immature Granulocytes Absolute: 0.03
Immature Granulocytes: 1 %
Lymphocytes Absolute Automated: 1.82 (ref 0.50–4.40)
Lymphocytes Automated: 27 %
MCH: 26.8 pg — ABNORMAL LOW (ref 28.0–32.0)
MCHC: 32.8 g/dL (ref 32.0–36.0)
MCV: 81.7 fL (ref 80.0–100.0)
MPV: 9.1 fL — ABNORMAL LOW (ref 9.4–12.3)
Monocytes Absolute Automated: 0.75 (ref 0.00–1.20)
Monocytes: 11 %
Neutrophils Absolute: 4.04 (ref 1.80–8.10)
Neutrophils: 61 %
Platelets: 286 (ref 140–400)
RBC: 4.59 — ABNORMAL LOW (ref 4.70–6.00)
RDW: 13 % (ref 12–15)
WBC: 6.65 (ref 3.50–10.80)

## 2012-08-05 LAB — COMPREHENSIVE METABOLIC PANEL
ALT: 13 U/L (ref 0–55)
AST (SGOT): 10 U/L (ref 5–34)
Albumin/Globulin Ratio: 1.1 (ref 0.9–2.2)
Albumin: 3.1 g/dL — ABNORMAL LOW (ref 3.5–5.0)
Alkaline Phosphatase: 60 U/L (ref 40–150)
Anion Gap: 10 (ref 5.0–15.0)
BUN: 10.6 mg/dL (ref 9.0–21.0)
Bilirubin, Total: 0.5 mg/dL (ref 0.2–1.2)
CO2: 27 (ref 22–29)
Calcium: 9 mg/dL (ref 8.5–10.5)
Chloride: 106 (ref 98–107)
Creatinine: 1.1 mg/dL (ref 0.7–1.3)
Globulin: 2.7 g/dL (ref 2.0–3.6)
Glucose: 176 mg/dL — ABNORMAL HIGH (ref 70–100)
Potassium: 3.8 (ref 3.5–5.1)
Protein, Total: 5.8 g/dL — ABNORMAL LOW (ref 6.0–8.3)
Sodium: 143 (ref 136–145)

## 2012-08-05 LAB — POCT GLUCOSE
Whole Blood Glucose POCT: 189 mg/dL — AB (ref 70–100)
Whole Blood Glucose POCT: 186 mg/dL — AB (ref 70–100)
Whole Blood Glucose POCT: 180 mg/dL — AB (ref 70–100)
Whole Blood Glucose POCT: 185 mg/dL — AB (ref 70–100)
Whole Blood Glucose POCT: 182 mg/dL — AB (ref 70–100)

## 2012-08-05 LAB — MAGNESIUM: Magnesium: 2 mg/dL (ref 1.6–2.6)

## 2012-08-05 LAB — GFR: EGFR: >60

## 2012-08-05 MED ORDER — ONDANSETRON HCL 4 MG/2ML IJ SOLN
4.0000 mg | INTRAMUSCULAR | Status: DC | PRN
Start: 2012-08-05 — End: 2012-08-10
  Administered 2012-08-05 – 2012-08-08 (×14): 4 mg via INTRAVENOUS
  Filled 2012-08-05 (×13): qty 2

## 2012-08-05 MED ORDER — HYDROMORPHONE HCL PF 1 MG/ML IJ SOLN
0.2000 mg | INTRAMUSCULAR | Status: DC | PRN
Start: 2012-08-05 — End: 2012-08-05
  Administered 2012-08-05: 0.2 mg via INTRAVENOUS
  Filled 2012-08-05: qty 1

## 2012-08-05 MED ORDER — LEVOFLOXACIN IN D5W 750 MG/150ML IV SOLN
750.0000 mg | Freq: Every day | INTRAVENOUS | Status: DC
Start: 2012-08-05 — End: 2012-08-10
  Administered 2012-08-05 – 2012-08-09 (×5): 750 mg via INTRAVENOUS
  Filled 2012-08-05 (×5): qty 150

## 2012-08-05 MED ORDER — INSULIN ASPART 100 UNIT/ML SC SOLN
1.0000 [IU] | Freq: Three times a day (TID) | SUBCUTANEOUS | Status: DC
Start: 2012-08-05 — End: 2012-08-10
  Administered 2012-08-05 – 2012-08-06 (×3): 1 [IU] via SUBCUTANEOUS
  Administered 2012-08-06: 2 [IU] via SUBCUTANEOUS
  Administered 2012-08-07 (×3): 1 [IU] via SUBCUTANEOUS
  Administered 2012-08-08 (×2): 2 [IU] via SUBCUTANEOUS
  Administered 2012-08-09 (×2): 3 [IU] via SUBCUTANEOUS
  Administered 2012-08-09: 1 [IU] via SUBCUTANEOUS
  Administered 2012-08-10: 2 [IU] via SUBCUTANEOUS
  Filled 2012-08-05 (×2): qty 10
  Filled 2012-08-05 (×2): qty 20
  Filled 2012-08-05 (×6): qty 10
  Filled 2012-08-05: qty 20
  Filled 2012-08-05: qty 30
  Filled 2012-08-05: qty 20
  Filled 2012-08-05: qty 30

## 2012-08-05 MED ORDER — KETOROLAC TROMETHAMINE 30 MG/ML IJ SOLN
15.00 mg | Freq: Four times a day (QID) | INTRAMUSCULAR | Status: AC | PRN
Start: 2012-08-05 — End: 2012-08-10
  Administered 2012-08-05: 15 mg via INTRAVENOUS
  Filled 2012-08-05: qty 1

## 2012-08-05 MED ORDER — HYDROMORPHONE HCL PF 1 MG/ML IJ SOLN
0.5000 mg | INTRAMUSCULAR | Status: DC | PRN
Start: 2012-08-05 — End: 2012-08-06
  Administered 2012-08-05 – 2012-08-06 (×4): 0.5 mg via INTRAVENOUS
  Filled 2012-08-05 (×4): qty 1

## 2012-08-05 MED ORDER — GLUCOSE 40 % PO GEL
15.00 g | ORAL | Status: DC | PRN
Start: 2012-08-05 — End: 2012-08-05

## 2012-08-05 MED ORDER — PANTOPRAZOLE SODIUM 40 MG IV SOLR
40.0000 mg | Freq: Every day | INTRAVENOUS | Status: DC
Start: 2012-08-05 — End: 2012-08-05
  Administered 2012-08-05: 40 mg via INTRAVENOUS
  Filled 2012-08-05: qty 40

## 2012-08-05 MED ORDER — ONDANSETRON HCL 4 MG/2ML IJ SOLN
4.0000 mg | Freq: Four times a day (QID) | INTRAMUSCULAR | Status: DC | PRN
Start: 2012-08-05 — End: 2012-08-05
  Filled 2012-08-05: qty 2

## 2012-08-05 MED ORDER — GLUCOSE 40 % PO GEL
15.0000 g | ORAL | Status: DC | PRN
Start: 2012-08-05 — End: 2012-08-10

## 2012-08-05 MED ORDER — TRAMADOL HCL 50 MG PO TABS
50.00 mg | ORAL_TABLET | Freq: Four times a day (QID) | ORAL | Status: DC | PRN
Start: 2012-08-05 — End: 2012-08-10
  Administered 2012-08-10: 50 mg via ORAL
  Filled 2012-08-05 (×2): qty 1

## 2012-08-05 MED ORDER — ACETAMINOPHEN 325 MG PO TABS
650.0000 mg | ORAL_TABLET | ORAL | Status: DC | PRN
Start: 2012-08-05 — End: 2012-08-10

## 2012-08-05 MED ORDER — DEXTROSE 50 % IV SOLN
25.0000 mL | INTRAVENOUS | Status: DC | PRN
Start: 2012-08-05 — End: 2012-08-10

## 2012-08-05 MED ORDER — SODIUM CHLORIDE 0.9 % IV SOLN
INTRAVENOUS | Status: DC
Start: 2012-08-05 — End: 2012-08-10
  Administered 2012-08-10: 75 mL/h via INTRAVENOUS

## 2012-08-05 MED ORDER — DEXTROSE 50 % IV SOLN
25.0000 mL | INTRAVENOUS | Status: DC | PRN
Start: 2012-08-05 — End: 2012-08-05

## 2012-08-05 MED ORDER — PROMETHAZINE HCL 25 MG/ML IJ SOLN
6.2500 mg | INTRAMUSCULAR | Status: DC | PRN
Start: 2012-08-05 — End: 2012-08-06
  Administered 2012-08-05 – 2012-08-06 (×3): 6.25 mg via INTRAVENOUS
  Filled 2012-08-05 (×3): qty 1

## 2012-08-05 MED ORDER — INSULIN ASPART 100 UNIT/ML SC SOLN
1.0000 [IU] | Freq: Every evening | SUBCUTANEOUS | Status: DC | PRN
Start: 2012-08-05 — End: 2012-08-05

## 2012-08-05 MED ORDER — INSULIN ASPART 100 UNIT/ML SC SOLN
1.0000 [IU] | Freq: Four times a day (QID) | SUBCUTANEOUS | Status: DC
Start: 2012-08-05 — End: 2012-08-05
  Administered 2012-08-05 (×3): 1 [IU] via SUBCUTANEOUS
  Filled 2012-08-05 (×3): qty 10

## 2012-08-05 MED ORDER — PANTOPRAZOLE SODIUM 40 MG IV SOLR
40.0000 mg | Freq: Two times a day (BID) | INTRAVENOUS | Status: DC
Start: 2012-08-05 — End: 2012-08-09
  Administered 2012-08-05 – 2012-08-09 (×8): 40 mg via INTRAVENOUS
  Filled 2012-08-05 (×8): qty 40

## 2012-08-05 MED ORDER — SODIUM CHLORIDE 0.9 % IV MBP
4.5000 g | Freq: Three times a day (TID) | INTRAVENOUS | Status: DC
Start: 2012-08-05 — End: 2012-08-05
  Administered 2012-08-05 (×2): 4.5 g via INTRAVENOUS
  Filled 2012-08-05 (×3): qty 4000

## 2012-08-05 MED ORDER — ALBUTEROL-IPRATROPIUM 2.5-0.5 (3) MG/3ML IN SOLN
3.0000 mL | Freq: Four times a day (QID) | RESPIRATORY_TRACT | Status: DC | PRN
Start: 2012-08-05 — End: 2012-08-10

## 2012-08-05 MED ORDER — GLUCAGON HCL (RDNA) 1 MG IJ SOLR
1.0000 mg | INTRAMUSCULAR | Status: DC | PRN
Start: 2012-08-05 — End: 2012-08-05

## 2012-08-05 MED ORDER — GLUCAGON HCL (RDNA) 1 MG IJ SOLR
1.0000 mg | INTRAMUSCULAR | Status: DC | PRN
Start: 2012-08-05 — End: 2012-08-10

## 2012-08-05 NOTE — Progress Notes (Addendum)
Pt resting in bed, c/o nausea. IVF per orders. Call bell in reach, continue to monitor. Recommended that pt try to avoid narcotic use as this could be increasing nausea/vomiting but pt states that "nothing else works and he doesn't care."

## 2012-08-05 NOTE — Progress Notes (Signed)
Pt received from ER, assessment done, pt resting in bed with call bell in reach

## 2012-08-05 NOTE — Consults (Signed)
Full consult dictated  Plan:  Will discontinue zosyn   Check a CXR PA lateral .  Continue Levaquin pending repeat CXR. If CXR shows persistent infiltrate will add flagyl . This will cover aspiration pneumonia nicely as well as CAP. In that case will check Ur Legionella Ag and mycoplasma and chlamydia serologies.  Check sats on RA  Keep HOB elevated in view of gastroparesis and high risk of aspiration  Will tailor Abx according to pt's clinical course and Cx results  Thank you for allowing me to participate in the care of this pt  D/W Dr Dwaine Gale

## 2012-08-05 NOTE — H&P (Signed)
ADMISSION HISTORY AND PHYSICAL EXAM    Date Time: 08/05/2012 2:47 AM  Patient Name: [name]  Attending Physician: Dorthula Nettles, MD       Chief Complaint:   Abdominal pain    History of Present Illness:   [name] is a 37 y.o. male who presents to the hospital with abdominal pain.  Pt reports that he has had persistent nausea and vomiting for 2 days.  Went to [facility] and was released after hydration and told to follow with PCP for his gastroparesis.  Pt states he did ok thru the night, but when he awoke on day of admission, he states the nausea was there and he started vomiting again.  He went to see his PCP who referred him to the ER for the vomiting.  In the ER, pt had an x-ray showing pneumonia, and will be admitted.    Past Medical History:     Past Medical History   Diagnosis Date   . Diabetes    . Hypertensive disorder    . Gastroparesis        Past Surgical History:     Past Surgical History   Procedure Date   . Cholecystectomy        Family History:     Family History   Problem Relation Age of Onset   . Diabetes Father    . Diabetes Maternal Grandmother    . Diabetes Paternal Grandmother        Social History:     History     Social History   . Marital Status: Single     Spouse Name: N/A     Number of Children: N/A   . Years of Education: N/A     Social History Main Topics   . Smoking status: Never Smoker    . Smokeless tobacco: Never Used   . Alcohol Use: Yes      Comment: socially, once a week maybe   . Drug Use: No   . Sexually Active: Not on file     Other Topics Concern   . Not on file     Social History Narrative   . No narrative on file       Allergies:   No Known Allergies    Medications:     Current/Home Medications    AMLODIPINE (NORVASC) 5 MG TABLET    Take 1 tablet (5 mg total) by mouth daily.    ERYTHROMYCIN BASE PO    Take by mouth.    INSULIN ASPART (NOVOLOG) 100 UNIT/ML INJECTION    Inject 1-8 Units into the skin 4 times daily - with meals and at bedtime.     INSULIN GLARGINE (LANTUS) 100 UNIT/ML INJECTION    Inject 36 Units into the skin nightly.    ONDANSETRON (ZOFRAN ODT) 4 MG DISINTEGRATING TABLET    Take 1 tablet (4 mg total) by mouth every 6 (six) hours as needed for Nausea.    ONDANSETRON (ZOFRAN) 8 MG TABLET    Take 1 tablet (8 mg total) by mouth every 8 (eight) hours as needed for Nausea.    PANTOPRAZOLE (PROTONIX) 40 MG TABLET    Take 1 tablet (40 mg total) by mouth daily.    SUCRALFATE (CARAFATE) 1 G TABLET    Take 1 tablet (1 g total) by mouth 3 (three) times daily.       Review of Systems:   Review of Systems   Constitutional: Positive for malaise/fatigue. Negative  for fever, chills and diaphoresis.   HENT: Negative for congestion, sore throat, neck pain and tinnitus.    Eyes: Negative for blurred vision and discharge.   Respiratory: Negative for cough, sputum production, shortness of breath and wheezing.    Cardiovascular: Negative for chest pain, palpitations and leg swelling.   Gastrointestinal: Positive for nausea, vomiting and abdominal pain. Negative for heartburn, diarrhea and constipation.   Genitourinary: Negative for dysuria, urgency, frequency and flank pain.   Musculoskeletal: Negative for myalgias, back pain and falls.   Skin: Negative for rash.   Neurological: Negative for dizziness, tingling, sensory change, speech change, seizures, loss of consciousness, weakness and headaches.   Endo/Heme/Allergies: Does not bruise/bleed easily.   Psychiatric/Behavioral: Negative for depression, suicidal ideas, memory loss and substance abuse. The patient is not nervous/anxious.          Physical Exam:   Physical Exam   Nursing note and vitals reviewed.  Constitutional: He is oriented to person, place, and time and well-developed, well-nourished, and in no distress.   HENT:   Head: Normocephalic and atraumatic.   Eyes: EOM are normal.   Neck: Normal range of motion. Neck supple. No JVD present. No tracheal deviation present. No thyromegaly present.    Cardiovascular: Normal rate, regular rhythm, normal heart sounds and intact distal pulses.  Exam reveals no gallop and no friction rub.    No murmur heard.  Pulmonary/Chest: Effort normal and breath sounds normal. No respiratory distress. He has no wheezes. He exhibits no tenderness.   Abdominal: Soft. He exhibits no distension, no pulsatile liver, no abdominal bruit and no mass. Bowel sounds are hyperactive. There is tenderness in the epigastric area and left upper quadrant. There is no rigidity, no rebound, no guarding and no CVA tenderness.   Musculoskeletal: Normal range of motion. He exhibits no edema.   Neurological: He is alert and oriented to person, place, and time. No cranial nerve deficit. GCS score is 15.   Skin: Skin is warm and dry.   Psychiatric: Mood, memory, affect and judgment normal.         Filed Vitals:    08/05/12 0210   BP: 154/92   Pulse: 101   Temp: 97.4 F (36.3 C)   Resp: 17   SpO2: 96%       Labs:     Results     Procedure Component Value Units Date/Time    Urinalysis [[id number]]  (Abnormal) Collected:08/04/12 2316    Specimen Information:Urine Updated:08/04/12 2338     Urine Type Clean Catch      Color, UA YELLOW      Clarity, UA SL CLOUDY (A)      Specific Gravity UA >1.030      Urine pH 6.0      Leukocyte Esterase, UA NEGATIVE      Nitrite, UA NEGATIVE      Protein, UA 30 (A)      Glucose, UA 500 (A)      Ketones UA 40 (A)      Urobilinogen, UA 1.0 mg/dL      Bilirubin, UA SMALL (A)      Blood, UA TRACE-INTACT (A)     Microscopic, Urine [[id number]]  (Abnormal) Collected:08/04/12 2316     RBC, UA 0 - 5 Updated:08/04/12 2338     WBC, UA 6 - 10 (A)      Squamous Epithelial Cells, Urine 0 - 5      Urine Bacteria Few (A)     Comprehensive  Metabolic Panel (CMP) [[id number]]  (Abnormal) Collected:08/04/12 1939    Specimen Information:Blood Updated:08/04/12 2011     Glucose 225 (H) mg/dL      BUN 40.9 mg/dL      Creatinine 1.3 mg/dL      Sodium 811      Potassium 3.9      Chloride 101       CO2 25      CALCIUM 9.4 mg/dL      Protein, Total 7.5 g/dL      Albumin 3.8 g/dL      AST (SGOT) 13 U/L      ALT 14 U/L      Alkaline Phosphatase 80 U/L      Bilirubin, Total 0.6 mg/dL      Globulin 3.7 (H) g/dL      Albumin/Globulin Ratio 1.0      Anion Gap 15.0     Lipase [[id number]] Collected:08/04/12 1939    Specimen Information:Blood Updated:08/04/12 2011     Lipase 29 U/L     GFR [[id number]] Collected:08/04/12 1939     EGFR >60.0 Updated:08/04/12 2011    CBC with Differential [[id number]]  (Abnormal) Collected:08/04/12 1939    Specimen Information:Blood / Blood Updated:08/04/12 1948     WBC 8.78      RBC 5.08      Hgb 13.8 g/dL      Hematocrit 41.3 (L) %      MCV 81.9 fL      MCH 27.2 (L) pg      MCHC 33.2 g/dL      RDW 13 %      Platelets 302      MPV 9.2 (L) fL      Neutrophils 79 %      Lymphocytes Automated 14 %      Monocytes 6 %      Eosinophils Automated 0 %      Basophils Automated 0 %      Immature Granulocyte 1 %      Neutrophils Absolute 6.95      Abs Lymph Automated 1.26      Abs Mono Automated 0.54      Abs Eos Automated 0.01      Absolute Baso Automated 0.02      Absolute Immature Granulocyte 0.04            Rads:   Abdomen 2 View With Chest 1 View    08/04/2012  History: Epigastric pain.  COMPARISON: CT 06/22/2012 and acute abdominal series 06/21/2012.  Upright PA chest and supine and upright abdominal radiographs are reviewed.  Right upper lobe infiltrate is seen, new since the prior study. No effusion is seen. Heart is normal size. Pulmonary vascularity is unremarkable. There is no pneumothorax.  Abdominal radiographs demonstrate evidence of prior cholecystectomy. There is a moderate volume of stool seen throughout the colon. No evidence for small bowel dilatation is seen. There are no worrisome air-fluid levels. No free air is seen beneath the hemidiaphragms.      08/04/2012   Right upper lobe pneumonia. Unremarkable abdomen abdomen.  Geanie Cooley, MD  08/04/2012 8:39 PM          Assessment/Plan:      Pneumonia:  IV zosyn and levaquin.  Monitor vitals and labs.  No leukocytosis or fever currently.     Intractable vomiting:  PRN antiemetics.  Monitor.  Hydrate.     Abdominal pain:  Try toradol.  Trying to avoid more narcotics due to  gastroparesis     GERD (gastroesophageal reflux disease):  protonix     Gastroparesis:  Hydrate.  NPO.  monitor      DM (diabetes mellitus):  accu checks with coverage.  Monitor     HTN (hypertension):  Fair control.  Will continue home meds once taking po    DVT prophylaxis:  SCDs    Signed by: Remigio Eisenmenger, RN, GNP

## 2012-08-05 NOTE — Progress Notes (Signed)
Assessment completed, vss, pt resting comfortably in bed, no complaints. Call bell in reach, no needs identified. Bed locked in lowest position. Continue to monitor.

## 2012-08-05 NOTE — Progress Notes (Signed)
Pt refuses to ambulate.  

## 2012-08-05 NOTE — H&P (Signed)
I have directly reviewed the clinical findings, lab, imaging studies and management of this patient in detail. I have interviewed and examined the pt and agree with the documentation,  as recorded by NP.    CC:  Chief Complaint   Patient presents with   . Abdominal Pain         Labs:   Results     Procedure Component Value Units Date/Time    M. pneumoniae Ab IgM, EIA [[id number]] Collected:08/05/12 0754     Updated:08/05/12 0755    CULTURE BLOOD AEROBIC AND ANAEROBIC [[id number]] Collected:08/05/12 0754    Specimen Information:Blood / Blood, Venipuncture Updated:08/05/12 0755    Narrative:    X2, from different sites, if not done in ER    Magnesium [[id number]] Collected:08/05/12 0754    Specimen Information:Blood Updated:08/05/12 0755    Comprehensive metabolic panel [[id number]] Collected:08/05/12 0754    Specimen Information:Blood Updated:08/05/12 0755    CBC and differential [[id number]] Collected:08/05/12 0754    Specimen Information:Blood / Blood Updated:08/05/12 0755    CULTURE BLOOD AEROBIC AND ANAEROBIC [[id number]] Collected:08/05/12 0753    Specimen Information:Blood / Blood, Venipuncture Updated:08/05/12 0753    Narrative:    X2, from different sites, if not done in ER    POCT glucose (Q6H) [[id number]]  (Abnormal) Collected:08/05/12 0416     POCT Glucose WB 189 (A) mg/dL GEXBMWU:13/24/40 1027    Urinalysis [[id number]]  (Abnormal) Collected:08/04/12 2316    Specimen Information:Urine Updated:08/04/12 2338     Urine Type Clean Catch      Color, UA YELLOW      Clarity, UA SL CLOUDY (A)      Specific Gravity UA >1.030      Urine pH 6.0      Leukocyte Esterase, UA NEGATIVE      Nitrite, UA NEGATIVE      Protein, UA 30 (A)      Glucose, UA 500 (A)      Ketones UA 40 (A)      Urobilinogen, UA 1.0 mg/dL      Bilirubin, UA SMALL (A)      Blood, UA TRACE-INTACT (A)     Microscopic, Urine [[id number]]  (Abnormal) Collected:08/04/12 2316     RBC, UA 0 - 5 Updated:08/04/12 2338     WBC, UA 6 - 10 (A)      Squamous  Epithelial Cells, Urine 0 - 5      Urine Bacteria Few (A)     Comprehensive Metabolic Panel (CMP) [[id number]]  (Abnormal) Collected:08/04/12 1939    Specimen Information:Blood Updated:08/04/12 2011     Glucose 225 (H) mg/dL      BUN 51.8 mg/dL      Creatinine 1.3 mg/dL      Sodium 841      Potassium 3.9      Chloride 101      CO2 25      CALCIUM 9.4 mg/dL      Protein, Total 7.5 g/dL      Albumin 3.8 g/dL      AST (SGOT) 13 U/L      ALT 14 U/L      Alkaline Phosphatase 80 U/L      Bilirubin, Total 0.6 mg/dL      Globulin 3.7 (H) g/dL      Albumin/Globulin Ratio 1.0      Anion Gap 15.0     Lipase [[id number]] Collected:08/04/12 1939    Specimen Information:Blood Updated:08/04/12 2011     Lipase 29 U/L  GFR [[id number]] Collected:08/04/12 1939     EGFR >60.0 Updated:08/04/12 2011    CBC with Differential [[id number]]  (Abnormal) Collected:08/04/12 1939    Specimen Information:Blood / Blood Updated:08/04/12 1948     WBC 8.78      RBC 5.08      Hgb 13.8 g/dL      Hematocrit 29.5 (L) %      MCV 81.9 fL      MCH 27.2 (L) pg      MCHC 33.2 g/dL      RDW 13 %      Platelets 302      MPV 9.2 (L) fL      Neutrophils 79 %      Lymphocytes Automated 14 %      Monocytes 6 %      Eosinophils Automated 0 %      Basophils Automated 0 %      Immature Granulocyte 1 %      Neutrophils Absolute 6.95      Abs Lymph Automated 1.26      Abs Mono Automated 0.54      Abs Eos Automated 0.01      Absolute Baso Automated 0.02      Absolute Immature Granulocyte 0.04             Physical:  Filed Vitals:    08/04/12 2017 08/05/12 0046 08/05/12 0210 08/05/12 0538   BP: 182/96 169/92 154/92 153/86   Pulse: 99 97 101 82   Temp:   97.4 F (36.3 C) 98.3 F (36.8 C)   TempSrc:   Temporal Artery    Resp: 15 16 17 16    Height:   1.93 m (6\' 4" )    Weight:   126.7 kg (279 lb 5.2 oz)    SpO2: 98% 98% 96% 98%     Intake and Output Summary (Last 24 hours) at Date Time    Intake/Output Summary (Last 24 hours) at 08/05/12 0805  Last data filed at 08/05/12  0500   Gross per 24 hour   Intake    450 ml   Output    350 ml   Net    100 ml     Physical Exam   Nursing note and vitals reviewed.   Constitutional: He is oriented to person, place, and time and well-developed, well-nourished, and in no distress.   Cardiovascular: Normal rate, regular rhythm, normal heart sounds and intact distal pulses. Exam reveals no gallop and no friction rub. No murmur heard.   Pulmonary/Chest: Effort normal and breath sounds normal. No respiratory distress. He has no wheezing, no rales. He exhibits no tenderness.     Assessment and plan:  1. Aspiration pneumonia: continue IV zosyn and levaquin, blood cx pending, monitor labs and vitals  2. Gastroparesis w/ intractable vomiting and abdominal pain: IVF hydration, continue supportive th/, pain control prn  3. GERD: continue protonix   4. DM II: ISS w/ novolog  5. HTN: continue norvasc   6. DVT prophylaxis: SCD    [name] Frampton, MD  08/05/2012  8:08 AM

## 2012-08-06 ENCOUNTER — Inpatient Hospital Stay: Payer: BC Managed Care – PPO

## 2012-08-06 LAB — POCT GLUCOSE
Whole Blood Glucose POCT: 184 mg/dL — AB (ref 70–100)
Whole Blood Glucose POCT: 207 mg/dL — AB (ref 70–100)
Whole Blood Glucose POCT: 207 mg/dL — AB (ref 70–100)
Whole Blood Glucose POCT: 178 mg/dL — AB (ref 70–100)
Whole Blood Glucose POCT: 187 mg/dL — AB (ref 70–100)

## 2012-08-06 LAB — M. PNEUMONIAE AB IGM, EIA: M. pneumoniae Ab IgM, EIA: 380 (ref <770)

## 2012-08-06 MED ORDER — ONDANSETRON HCL 4 MG/2ML IJ SOLN
4.0000 mg | Freq: Once | INTRAMUSCULAR | Status: AC | PRN
Start: 2012-08-06 — End: 2012-08-06

## 2012-08-06 MED ORDER — HYDROMORPHONE PCA 0.2 MG/ML BOLUS FROM PCA
0.5000 mg | INTRAVENOUS | Status: DC | PRN
Start: 2012-08-06 — End: 2012-08-09
  Filled 2012-08-06: qty 100

## 2012-08-06 MED ORDER — HYDROMORPHONE HCL PF 1 MG/ML IJ SOLN
1.0000 mg | INTRAMUSCULAR | Status: DC | PRN
Start: 2012-08-06 — End: 2012-08-10
  Administered 2012-08-06 (×2): 1 mg via INTRAVENOUS
  Filled 2012-08-06 (×2): qty 1

## 2012-08-06 MED ORDER — METOCLOPRAMIDE HCL 5 MG/ML IJ SOLN
10.0000 mg | Freq: Three times a day (TID) | INTRAMUSCULAR | Status: DC
Start: 2012-08-06 — End: 2012-08-06

## 2012-08-06 MED ORDER — PROMETHAZINE HCL 25 MG/ML IJ SOLN
6.2500 mg | Freq: Once | INTRAMUSCULAR | Status: DC | PRN
Start: 2012-08-06 — End: 2012-08-06
  Administered 2012-08-06: 6.25 mg via INTRAVENOUS
  Filled 2012-08-06: qty 1

## 2012-08-06 MED ORDER — DIPHENHYDRAMINE HCL 50 MG/ML IJ SOLN
12.5000 mg | Freq: Four times a day (QID) | INTRAMUSCULAR | Status: DC | PRN
Start: 2012-08-06 — End: 2012-08-09

## 2012-08-06 MED ORDER — ACETAMINOPHEN 325 MG PO TABS
650.0000 mg | ORAL_TABLET | Freq: Four times a day (QID) | ORAL | Status: DC | PRN
Start: 2012-08-06 — End: 2012-08-09

## 2012-08-06 MED ORDER — AMLODIPINE BESYLATE 5 MG PO TABS
5.0000 mg | ORAL_TABLET | Freq: Every day | ORAL | Status: DC
Start: 2012-08-06 — End: 2012-08-10
  Administered 2012-08-06 – 2012-08-10 (×4): 5 mg via ORAL
  Filled 2012-08-06 (×6): qty 1

## 2012-08-06 MED ORDER — METRONIDAZOLE IN NACL 500 MG/100 ML IV SOLN
500.0000 mg | Freq: Three times a day (TID) | INTRAVENOUS | Status: DC
Start: 2012-08-06 — End: 2012-08-10
  Administered 2012-08-06 – 2012-08-10 (×12): 500 mg via INTRAVENOUS
  Filled 2012-08-06 (×12): qty 100

## 2012-08-06 MED ORDER — PROMETHAZINE HCL 25 MG/ML IJ SOLN
12.5000 mg | INTRAMUSCULAR | Status: DC | PRN
Start: 2012-08-06 — End: 2012-08-08
  Administered 2012-08-06 (×3): 12.5 mg via INTRAVENOUS
  Filled 2012-08-06 (×4): qty 1

## 2012-08-06 MED ORDER — OXYCODONE-ACETAMINOPHEN 5-325 MG PO TABS
1.00 | ORAL_TABLET | ORAL | Status: DC | PRN
Start: 2012-08-06 — End: 2012-08-09

## 2012-08-06 MED ORDER — NALOXONE HCL 0.4 MG/ML IJ SOLN
0.10 mg | INTRAMUSCULAR | Status: DC | PRN
Start: 2012-08-06 — End: 2012-08-09

## 2012-08-06 MED ORDER — HYDRALAZINE HCL 20 MG/ML IJ SOLN
10.0000 mg | Freq: Four times a day (QID) | INTRAMUSCULAR | Status: DC | PRN
Start: 2012-08-06 — End: 2012-08-10
  Administered 2012-08-06 – 2012-08-09 (×5): 10 mg via INTRAVENOUS
  Filled 2012-08-06 (×5): qty 1

## 2012-08-06 MED ORDER — INSULIN GLARGINE 100 UNIT/ML SC SOLN
10.0000 [IU] | Freq: Every evening | SUBCUTANEOUS | Status: DC
Start: 2012-08-06 — End: 2012-08-10
  Administered 2012-08-06 – 2012-08-09 (×4): 10 [IU] via SUBCUTANEOUS
  Filled 2012-08-06 (×4): qty 100

## 2012-08-06 MED ORDER — HYDROMORPHONE PCA 0.2 MG/ML 100 ML (OUTSOURCED)
INTRAVENOUS | Status: DC
Start: 2012-08-06 — End: 2012-08-09
  Administered 2012-08-06: 20 mg via INTRAVENOUS

## 2012-08-06 MED ORDER — IODIXANOL 320 MG/ML IV SOLN
100.0000 mL | Freq: Once | INTRAVENOUS | Status: AC | PRN
Start: 2012-08-06 — End: 2012-08-06
  Administered 2012-08-06: 100 mL via INTRAVENOUS

## 2012-08-06 MED ORDER — HYDROMORPHONE HCL PF 1 MG/ML IJ SOLN
1.0000 mg | INTRAMUSCULAR | Status: DC | PRN
Start: 2012-08-06 — End: 2012-08-06
  Administered 2012-08-06 (×3): 1 mg via INTRAVENOUS
  Filled 2012-08-06 (×3): qty 1

## 2012-08-06 MED ORDER — SUCRALFATE 1 GM/10ML PO SUSP
1.0000 g | Freq: Four times a day (QID) | ORAL | Status: DC
Start: 2012-08-06 — End: 2012-08-06

## 2012-08-06 NOTE — Progress Notes (Signed)
INFECTIOUS DISEASES PROGRESS NOTE  [name]        Date Time: 08/06/2012 9:20 AM  Patient Name: [name], [name]    Patient Active Problem List    Diagnosis Date Noted   . Pneumonia 08/05/2012   . Intractable vomiting 08/05/2012   . Abdominal pain 08/05/2012   . DM (diabetes mellitus) 08/05/2012   . HTN (hypertension) 08/05/2012   . Type 2 diabetes, uncontrolled, with neuropathy 06/27/2012   . Hypertension 06/27/2012   . Nausea & vomiting 06/27/2012   . DKA (diabetic ketoacidoses) 06/17/2012   . GERD (gastroesophageal reflux disease) 06/17/2012   . Gastroparesis 06/17/2012   . Dehydration, moderate 06/17/2012   . Gastritis without bleeding 06/17/2012   . AKI (acute kidney injury) 06/17/2012           Assessment:     Intractable vomiting   Abdominal pain- ?obstiption? Vs other abdominal pathology.Abd xray  with large amt of stool o/w unremarkble  Gastroparesis   Fever- w/o leucocytosis. CXR neg for pneumonia and no resp SX but h is at risk for aspiration given gastroparesis and N/V and being drowsy from pain meds. On levaquin   Type 2 diabetes, uncontrolled, with neuropathy  GERD (gastroesophageal reflux disease)   HTN (hypertension)   Aspiration risk    Plan:   Continue Levaquin empirically.Will add flagyl for anaerobic coverage for abd and possible aspiration pneumonitis  Aspiration precautions.  If he has persistent fevers or leucocytosis consider imaging abdomen.  Supportive care    Subjective:   [name] today has, No fevers, No chills, No nightsweats,+malaise   No headache, No chest pain,   + abdominal pain -Nausea/ vomiting, No diarrhea,No BM   No dysuria, No urgency, No frequency,    No Cough - SOB.    Appetite poor   No new weakness tingling or numbness,   Feeling "OK"    Antibiotics:   Levaquin  All other medications reviewed    Lines:     Active PICC Line / CVC Line / PIV Line / Drain / Airway / Intraosseous Line / Epidural Line / ART Line / Line / Wound / Pressure Ulcer / NG/OG Tube     Name    Placement date   Placement time   Site   Days    Peripheral IV 08/04/12 Right Forearm  08/04/12   1942   Forearm   1          Physical Exam:   Temp:  [97.9 F (36.6 C)-100.4 F (38 C)] 100.4 F (38 C)  Heart Rate:  [89-103] 99   Resp Rate:  [14-18] 14   BP: (146-177)/(82-99) 169/87 mmHg        GEN: sleepy but arousable, Oriented x3, No new F.N deficits, Normal affect  HEENT: NC.AT,PERRLA, EOMI. MMM  Neck:Supple Neck,No JVD, No cervical lymphadenopathy appreciated.   Chest: Symmetrical Chest wall movement, Good air movement bilaterally, CTAB  CVS: RRR,No Gallops,Rubs or new Murmurs, No Parasternal Heave  Abd:  +ve B.Sounds, Abd Soft, mildly TTP  upper abd , No organomegaly appriciated, No rebound -guarding or rigidity.  Ext:  No Cyanosis, Clubbing or edema, No new Rash or bruise  Neuro: Grossly non focal          Labs:     Results     Procedure Component Value Units Date/Time    POCT Glucose [[id number]]  (Abnormal) Collected:08/06/12 0747     POCT Glucose WB 184 (A) mg/dL NWGNFAO:13/08/65 7846  POCT Glucose [[id number]]  (Abnormal) Collected:08/05/12 2142     POCT Glucose WB 182 (A) mg/dL ZOXWRUE:45/40/98 1191    Legionella antigen, urine [[id number]] Collected:08/05/12 0951    Specimen Information:Urine / Urine, Clean Catch Updated:08/05/12 1735    Narrative:    ORDER#: [id number]                                    ORDERED BY: Georgina Peer  SOURCE: Urine, Clean Catch                           COLLECTED:  08/05/12 09:51  ANTIBIOTICS AT COLL.:                                RECEIVED :  08/05/12 12:57  Legionella, Rapid Urinary Antigen          FINAL       08/05/12 17:35  08/05/12   Negative for Legionella pneumophila Serogroup 1 Antigen             Limitations of Test:             1. Negative results do not exclude infection with Legionella                pneumophila Serogroup 1.             2. Does not detect other serogroups of L. pneumophila                or other Legionella species.             Test  Reference Range: Negative      S. PNEUMONIAE, RAPID URINARY ANTIGEN [[id number]] Collected:08/05/12 0951    Specimen Information:Urine, Clean Catch Updated:08/05/12 1735    Narrative:    ORDER#: [id number]                                    ORDERED BY: Georgina Peer  SOURCE: Urine, Clean Catch                           COLLECTED:  08/05/12 09:51  ANTIBIOTICS AT COLL.:                                RECEIVED :  08/05/12 12:57  S. pneumoniae, Rapid Urinary Antigen       FINAL       08/05/12 17:35  08/05/12   Negative for Streptococcus pneumoniae Urinary Antigen             Note:             This is a presumptive test for the direct qualitative             detection of bacterial antigen. This test is not intended as             a substitute for a gram stain and bacterial culture. Samples             with extremely low levels of antigen may yield negative             results.  Reference Range: Negative      POCT Glucose [[id number]]  (Abnormal) Collected:08/05/12 1652     POCT Glucose WB 185 (A) mg/dL ZOXWRUE:45/40/98 1191    POCT Glucose [[id number]]  (Abnormal) Collected:08/05/12 1140     POCT Glucose WB 180 (A) mg/dL YNWGNFA:21/30/86 5784    CULTURE BLOOD AEROBIC AND ANAEROBIC [[id number]] Collected:08/05/12 0753    Specimen Information:Blood / Blood, Venipuncture Updated:08/05/12 0955    Narrative:    X2, from different sites, if not done in ER    CULTURE BLOOD AEROBIC AND ANAEROBIC [[id number]] Collected:08/05/12 0754    Specimen Information:Blood / Blood, Venipuncture Updated:08/05/12 0955    Narrative:    X2, from different sites, if not done in ER    POCT glucose (Q6H) [[id number]]  (Abnormal) Collected:08/05/12 0930     POCT Glucose WB 186 (A) mg/dL HKVQQVZ:56/38/75 6433          Rads:     Radiology Results (24 Hour)     Procedure Component Value Units Date/Time    XR Chest 2 Views [[id number]] Collected:08/05/12 2226    Order Status:Completed  Updated:08/05/12 2232    Narrative:    CLINICAL HISTORY:  Follow-up pneumonia    COMPARISON: 01/10/2011    FINDINGS:Frontal and lateral views of the chest:  The lungs appear  clear. There is no pneumothorax or effusion. Cardiomediastinal contours  are within normal limits. Surgical clips in right upper quadrant       Impression:      1.  The lungs appear clear    Emeline Darling, MD   08/05/2012 10:28 PM            Signed by: [name] Mody, MD

## 2012-08-06 NOTE — SLP Progress Note (Signed)
Speech Language Pathology  RN reports pt continue to c/o n/v. Recently medicated for n/v. Will hold swallow evaluation this date. Will reattempt tomorrow as medically appropriate.  Truett Mainland M.S. CCC-SLP   Pager id [phone]

## 2012-08-06 NOTE — Consults (Signed)
Patient seen and examined.  Full note dictated.  Recommend:  Pain management consult.  Attempt to minimize use of narcotics.  EGD Friday.  Continue reglan..  Advance diet as tolerated.  NPO after midnight tomorrow.

## 2012-08-06 NOTE — Progress Notes (Signed)
[facility] Hospitalist Daily Progress Note        Date Time: 08/06/2012  1:12 PM  Patient Name:[name]  ZOX:[id number]  PCP: [name], MD  Attending Physician:Yahshua Thibault Glenis Smoker M.D.      Chief Complaint:      Chief Complaint   Patient presents with   . Abdominal Pain       Subjective:     Efren Kross continues to have epigastric abdominal pain with recurrent nausea and emesis; febrile:tmax:100.8  , No new weakness tingling or numbness, No Cough - SOB.  Assessment/Plan   Fever; abd tenderness/ pain with intractable nausea and vomiting :  - keep NPO ; IVF; obtain CT abd; GI consult  Gastroparesis :pt was recently switched to erythromicin from reglan by PCP ;consider restarting if CT bad negative  Aspiration pneumonia?  :On levaquin and flagyl ; ID following; speech eval  Type 2 diabetes, uncontrolled, with neuropathy :ISS; start low dose lantus  GERD (gastroesophageal reflux disease) :ppi  HTN (hypertension)continue norvasc ; iv hydralazine prn    DVT Prohylaxis: lovenox  Code Status: full  Prognosis:guarded  Type of Admission:Inpatient  Estimated Length of Stay (including stay in the ER receiving treatment): IP  Medical Necessity for stay:fever ; intractable NV /abd pain; aspiration      Allergies:   No Known Allergies    Physical Exam:    height is 1.93 m (6\' 4" ) and weight is 126.7 kg (279 lb 5.2 oz). His temporal artery temperature is 100.8 F (38.2 C). His blood pressure is 175/99 and his pulse is 102. His respiration is 16 and oxygen saturation is 99%.   Body mass index is 34.01 kg/(m^2).  Filed Vitals:    08/05/12 2219 08/06/12 0129 08/06/12 0722 08/06/12 1051   BP: 177/98 176/99 169/87 175/99   Pulse: 103 99 99 102   Temp: 99.1 F (37.3 C) 98.9 F (37.2 C) 100.4 F (38 C) 100.8 F (38.2 C)   TempSrc:   Temporal Artery Temporal Artery   Resp: 16 18 14 16    Height:       Weight:       SpO2: 99% 98% 98% 99%     Intake and Output Summary (Last 24  hours) at Date Time    Intake/Output Summary (Last 24 hours) at 08/06/12 1312  Last data filed at 08/06/12 1115   Gross per 24 hour   Intake   1290 ml   Output   2105 ml   Net   -815 ml       Exam  Awake Alert, Oriented *3, No new F.N deficits, Normal affect  NC.AT,PERRAL  Supple Neck,No JVD, No cervical lymphadenopathy appriciated.   Symmetrical Chest wall movement, Good air movement bilaterally, CTAB  RRR,No Gallops,Rubs or new Murmurs, No Parasternal Heave  +ve B.Sounds, Abd Soft, epigastric tenderness, No rebound -guarding or rigidity.  No Cyanosis, Clubbing or edema, No new Rash or bruise    Consult Input/Plan     Plan  None    Medications:     Current Facility-Administered Medications   Medication Dose Route Frequency Last Rate Last Dose   . 0.9%  NaCl infusion   Intravenous Continuous 75 mL/hr at 08/06/12 1225     . acetaminophen (TYLENOL) tablet 650 mg  650 mg Oral Q4H PRN       . dextrose (GLUCOSE) 40 % oral gel 15 g  15 g Oral PRN       . dextrose 50 %  bolus 25 mL  25 mL Intravenous PRN       . glucagon (rDNA) (GLUCAGEN) injection 1 mg  1 mg Intramuscular PRN       . HYDROmorphone (DILAUDID) injection 1 mg  1 mg Intravenous Q3H PRN   1 mg at 08/06/12 1105   . insulin aspart (NovoLOG) injection 1-5 Units  1-5 Units Subcutaneous TID AC   2 Units at 08/06/12 1224   . ipratropium-albuterol (DUO-NEB) 0.5-2.5(3) mg/3 mL nebulizer 3 mL  3 mL Nebulization Q6H PRN       . ketorolac (TORADOL) injection 15 mg  15 mg Intravenous Q6H PRN   15 mg at 08/05/12 1013   . levofloxacin (LEVAQUIN) 750mg  in D5W IVPB (premix)  750 mg Intravenous Daily   750 mg at 08/05/12 2236   . metoclopramide (REGLAN) injection 10 mg  10 mg Intravenous TID AC       . metroNIDAZOLE (FLAGYL) 500mg  in NS IVPB (premix)  500 mg Intravenous Q8H 100 mL/hr at 08/06/12 1224 500 mg at 08/06/12 1224   . ondansetron (ZOFRAN) injection 4 mg  4 mg Intravenous Q4H PRN   4 mg at 08/06/12 1104   . pantoprazole (PROTONIX) injection 40 mg  40 mg  Intravenous BID   40 mg at 08/06/12 0948   . promethazine (PHENERGAN) injection 12.5 mg  12.5 mg Intravenous Q4H PRN   12.5 mg at 08/06/12 0838   . traMADol (ULTRAM) tablet 50 mg  50 mg Oral Q6H PRN       . [DISCONTINUED] HYDROmorphone (DILAUDID) injection 0.2 mg  0.2 mg Intravenous Q2H PRN   0.2 mg at 08/05/12 1136   . [DISCONTINUED] HYDROmorphone (DILAUDID) injection 0.5 mg  0.5 mg Intravenous Q3H PRN   0.5 mg at 08/06/12 0008   . [DISCONTINUED] piperacillin-tazobactam (ZOSYN) 4.5 g in sodium chloride 0.9 % 100 mL IVPB mini-bag plus  4.5 g Intravenous Q8H 200 mL/hr at 08/05/12 1617 4.5 g at 08/05/12 1617   . [DISCONTINUED] promethazine (PHENERGAN) injection 6.25 mg  6.25 mg Intravenous Q4H PRN   6.25 mg at 08/06/12 0131     Review of Systems:   A comprehensive review of systems has no changes since H&P was obtained except as mentioned in the subjective section.    Labs:     Results     Procedure Component Value Units Date/Time    POCT Glucose [[id number]]  (Abnormal) Collected:08/06/12 1130     POCT Glucose WB 207 (A) mg/dL WUJWJXB:14/78/29 5621    POCT Glucose [[id number]]  (Abnormal) Collected:08/06/12 1130     POCT Glucose WB 207 (A) mg/dL NGEXBMW:41/32/44 0102    CULTURE BLOOD AEROBIC AND ANAEROBIC [[id number]] Collected:08/05/12 0753    Specimen Information:Blood / Blood, Venipuncture Updated:08/06/12 1015    Narrative:    X2, from different sites, if not done in ER  ORDER#: [id number]                                    ORDERED BY: HARTOJO, WIBISO  SOURCE: Blood, Venipuncture Arm                      COLLECTED:  08/05/12 07:53  ANTIBIOTICS AT COLL.:                                RECEIVED :  08/05/12  09:55  Culture Blood Aerobic and Anaerobic        PRELIM      08/06/12 10:15  08/06/12   No Growth after 1 day/s of incubation.      CULTURE BLOOD AEROBIC AND ANAEROBIC [[id number]] Collected:08/05/12 0754    Specimen Information:Blood / Blood, Venipuncture Updated:08/06/12 1015    Narrative:    X2, from different  sites, if not done in ER  ORDER#: [id number]                                    ORDERED BY: HARTOJO, WIBISO  SOURCE: Blood, Venipuncture Arm                      COLLECTED:  08/05/12 07:54  ANTIBIOTICS AT COLL.:                                RECEIVED :  08/05/12 09:55  Culture Blood Aerobic and Anaerobic        PRELIM      08/06/12 10:15  08/06/12   No Growth after 1 day/s of incubation.      POCT Glucose [[id number]]  (Abnormal) Collected:08/06/12 0747     POCT Glucose WB 184 (A) mg/dL YQMVHQI:69/62/95 2841    POCT Glucose [[id number]]  (Abnormal) Collected:08/05/12 2142     POCT Glucose WB 182 (A) mg/dL LKGMWNU:27/25/36 6440    Legionella antigen, urine [[id number]] Collected:08/05/12 0951    Specimen Information:Urine / Urine, Clean Catch Updated:08/05/12 1735    Narrative:    ORDER#: [id number]                                    ORDERED BY: Georgina Peer  SOURCE: Urine, Clean Catch                           COLLECTED:  08/05/12 09:51  ANTIBIOTICS AT COLL.:                                RECEIVED :  08/05/12 12:57  Legionella, Rapid Urinary Antigen          FINAL       08/05/12 17:35  08/05/12   Negative for Legionella pneumophila Serogroup 1 Antigen             Limitations of Test:             1. Negative results do not exclude infection with Legionella                pneumophila Serogroup 1.             2. Does not detect other serogroups of L. pneumophila                or other Legionella species.             Test Reference Range: Negative      S. PNEUMONIAE, RAPID URINARY ANTIGEN [[id number]] Collected:08/05/12 0951    Specimen Information:Urine, Clean Catch Updated:08/05/12 1735    Narrative:    ORDER#: [id number]  ORDERED BY: HARTOJO, WIBISO  SOURCE: Urine, Clean Catch                           COLLECTED:  08/05/12 09:51  ANTIBIOTICS AT COLL.:                                RECEIVED :  08/05/12 12:57  S. pneumoniae, Rapid Urinary Antigen       FINAL       08/05/12  17:35  08/05/12   Negative for Streptococcus pneumoniae Urinary Antigen             Note:             This is a presumptive test for the direct qualitative             detection of bacterial antigen. This test is not intended as             a substitute for a gram stain and bacterial culture. Samples             with extremely low levels of antigen may yield negative             results.             Reference Range: Negative      POCT Glucose [[id number]]  (Abnormal) Collected:08/05/12 1652     POCT Glucose WB 185 (A) mg/dL ZOXWRUE:45/40/98 1191          Rads:   Radiological Procedure reviewed.  Radiology Results (24 Hour)     Procedure Component Value Units Date/Time    XR Chest 2 Views [[id number]] Collected:08/05/12 2226    Order Status:Completed  Updated:08/05/12 2232    Narrative:    CLINICAL HISTORY: Follow-up pneumonia    COMPARISON: 01/10/2011    FINDINGS:Frontal and lateral views of the chest:  The lungs appear  clear. There is no pneumothorax or effusion. Cardiomediastinal contours  are within normal limits. Surgical clips in right upper quadrant       Impression:      1.  The lungs appear clear    Emeline Darling, MD   08/05/2012 10:28 PM          Multi-Disciplinary Care Input:   Case management notes: No Patient Care Coordination Note on file.     PT/OT/ST notes:     Time spent for evaluation, management and coordination of care:   35  minutes          Signed by: Anselmo Pickler  08/06/2012 1:12 PM

## 2012-08-06 NOTE — Progress Notes (Signed)
Received report from night shift nurse. Assessment completed. Pt c/o pain and n/v so pt was given Dilaudid (7:26 am) and Phenergan (8:38 am). Will continue to monitor. Call bell within reach.

## 2012-08-06 NOTE — Plan of Care (Signed)
Pt has had emesis of green/yellow colored liquid all night.  Over 7 times up until now.  He complained of continuous pain and nausea and the inability to rest.  His dilaudid was increased to 1 mg and his phenergan increased to 12.5.  He is resting more comfortably now.  Chest xray was done.  Sputum culture is still open.  Call bell is within reach.  I will continue to monitor for safety, comfort, and other needs.

## 2012-08-06 NOTE — Progress Notes (Signed)
Pt medicated for pain and emesis

## 2012-08-07 LAB — CBC
Hematocrit: 41.7 % — ABNORMAL LOW (ref 42.0–52.0)
Hgb: 14.1 g/dL (ref 13.0–17.0)
MCH: 27.3 pg — ABNORMAL LOW (ref 28.0–32.0)
MCHC: 33.8 g/dL (ref 32.0–36.0)
MCV: 80.7 fL (ref 80.0–100.0)
MPV: 8.8 fL — ABNORMAL LOW (ref 9.4–12.3)
Platelets: 338 (ref 140–400)
RBC: 5.17 (ref 4.70–6.00)
RDW: 13 % (ref 12–15)
WBC: 9.2 (ref 3.50–10.80)

## 2012-08-07 LAB — POCT GLUCOSE
Whole Blood Glucose POCT: 171 mg/dL — AB (ref 70–100)
Whole Blood Glucose POCT: 175 mg/dL — AB (ref 70–100)
Whole Blood Glucose POCT: 165 mg/dL — AB (ref 70–100)
Whole Blood Glucose POCT: 175 mg/dL — AB (ref 70–100)

## 2012-08-07 MED ORDER — CLONIDINE HCL 0.1 MG/24HR TD PTWK
1.0000 | MEDICATED_PATCH | TRANSDERMAL | Status: DC
Start: 2012-08-07 — End: 2012-08-10
  Administered 2012-08-07: 1 via TRANSDERMAL
  Filled 2012-08-07: qty 1

## 2012-08-07 MED ORDER — LORAZEPAM 2 MG/ML IJ SOLN
0.50 mg | Freq: Once | INTRAMUSCULAR | Status: AC
Start: 2012-08-07 — End: 2012-08-07
  Administered 2012-08-07: 0.5 mg via INTRAVENOUS
  Filled 2012-08-07: qty 1

## 2012-08-07 NOTE — Progress Notes (Signed)
ANESTHESIOLOGY PAIN SERVICE INITIAL NOTE    Patient name: [name]  Requesting Physician: Dr. Dwaine Gale  Reason for Consult: Abdominal pain, admitted 08/05/2012  Relevant PMH: Insulin-dependent diabetes; gastroparesis       Assessment:   Abdominal pain, HD #3   Gastroparesis   Insulin-dependent diabetes, recently uncontrolled   Minimal opioid use through PCA   EGD scheduled for tomorrow    Plan:   Continue current regimen. Reassess tomorrow.       Current analgesic regimen:   Dilaudid IV PCA 0.3 mg PRN (see orders for details)  _ _ _ _ _ _ _ _ _ _ _ _ _ _ _ _ _ _ _ _ _ _ _ _ _ _ _ _ _ _ _ _ _ _ _ _ _    Allergies: Review of patient's allergies indicates no known allergies.  _ _ _ _ _ _ _ _ _ _ _ _ _ _ _ _ _ _ _ _ _ _ _ _ _ _ _ _ _ _ _ _ _ _ _ _ _    PEx:   BP 168/97  Pulse 104  Temp 98.5 F (36.9 C) (Temporal Artery)  Resp 16  Ht 1.93 m (6\' 4" )  Wt 126.7 kg (279 lb 5.2 oz)  BMI 34.01 kg/m2  SpO2 98%   Alert and oriented; no acute distress.       Verdis Prime, MD

## 2012-08-07 NOTE — Progress Notes (Signed)
INFECTIOUS DISEASES PROGRESS NOTE  Lambert Mody        Date Time: 08/07/2012 9:55 AM  Patient Name: [name], [name]    Patient Active Problem List    Diagnosis Date Noted   . Pneumonia 08/05/2012   . Intractable vomiting 08/05/2012   . Abdominal pain 08/05/2012   . DM (diabetes mellitus) 08/05/2012   . HTN (hypertension) 08/05/2012   . Type 2 diabetes, uncontrolled, with neuropathy 06/27/2012   . Hypertension 06/27/2012   . Nausea & vomiting 06/27/2012   . DKA (diabetic ketoacidoses) 06/17/2012   . GERD (gastroesophageal reflux disease) 06/17/2012   . Gastroparesis 06/17/2012   . Dehydration, moderate 06/17/2012   . Gastritis without bleeding 06/17/2012   . AKI (acute kidney injury) 06/17/2012           Assessment:     Intractable vomiting   Abdominal pain- . EGD tomorrow. CT abd unrevealing   Gastroparesis   Fever- w/o leucocytosis.Afebrile x 24 hours.CT abd unrevealing , CXR neg for pneumonia and no resp SX but he is at risk for aspiration given gastroparesis and N/V and being drowsy from pain meds. On Levaquin/flagyl    Type 2 diabetes, uncontrolled, with neuropathy  GERD (gastroesophageal reflux disease)   HTN (hypertension)   Aspiration risk    Plan:   Continue Levaquin and flagyl  empirically.Aspiration precautions.  If EGD is negative will D/C Abx   Await EGD  Check CBC  Supportive care    Subjective:   [name] today has, No fevers, No chills, No nightsweats,+malaise   No headache, No chest pain,   + upper abdominal pain -Nausea/ vomiting, No diarrhea,No BM   No dysuria, No urgency, No frequency,    No Cough - SOB.    Appetite poor   No new weakness tingling or numbness,   Feeling "so, so"    Antibiotics:   Levaquin  Flagyl  All other medications reviewed    Lines:     Active PICC Line / CVC Line / PIV Line / Drain / Airway / Intraosseous Line / Epidural Line / ART Line / Line / Wound / Pressure Ulcer / NG/OG Tube     Name   Placement date   Placement time   Site   Days    Peripheral IV 08/04/12 Right  Forearm  08/04/12   1942   Forearm   1          Physical Exam:   Temp:  [97.1 F (36.2 C)-100.8 F (38.2 C)] 98.8 F (37.1 C)  Heart Rate:  [94-124] 115   Resp Rate:  [12-20] 18   BP: (168-190)/(82-106) 174/96 mmHg        GEN: sleepy but arousable, Oriented x3, No new F.N deficits, Normal affect  HEENT: NC.AT,PERRLA, EOMI. MMM  Neck:Supple Neck,No JVD, No cervical lymphadenopathy appreciated.   Chest: Symmetrical Chest wall movement, Good air movement bilaterally, CTAB  CVS: RRR,No Gallops,Rubs or new Murmurs, No Parasternal Heave  Abd:  +ve B.Sounds, Abd Soft, mildly TTP  upper abd , No organomegaly appriciated, No rebound -guarding or rigidity.  Ext:  No Cyanosis, Clubbing or edema, No new Rash or bruise  Neuro: Grossly non focal          Labs:     Results     Procedure Component Value Units Date/Time    POCT Glucose [[id number]]  (Abnormal) Collected:08/07/12 0738     POCT Glucose WB 171 (A) mg/dL YNWGNFA:21/30/86 5784    POCT Glucose [  [id number]]  (Abnormal) Collected:08/06/12 2106     POCT Glucose WB 187 (A) mg/dL WUJWJXB:14/78/29 5621    M. pneumoniae Ab IgM, EIA [[id number]] Collected:08/05/12 0754     M. pneumoniae Ab IgM, EIA 380 Updated:08/06/12 2133    POCT Glucose [[id number]]  (Abnormal) Collected:08/06/12 1717     POCT Glucose WB 178 (A) mg/dL LKGMWNU:27/25/36 6440    POCT Glucose [[id number]]  (Abnormal) Collected:08/06/12 1130     POCT Glucose WB 207 (A) mg/dL LOVFIEP:32/95/18 8416    POCT Glucose [[id number]]  (Abnormal) Collected:08/06/12 1130     POCT Glucose WB 207 (A) mg/dL UXNATFT:73/22/02 5427    CULTURE BLOOD AEROBIC AND ANAEROBIC [[id number]] Collected:08/05/12 0753    Specimen Information:Blood / Blood, Venipuncture Updated:08/06/12 1015    Narrative:    X2, from different sites, if not done in ER  ORDER#: [id number]                                    ORDERED BY: HARTOJO, WIBISO  SOURCE: Blood, Venipuncture Arm                      COLLECTED:  08/05/12 07:53  ANTIBIOTICS AT COLL.:                                 RECEIVED :  08/05/12 09:55  Culture Blood Aerobic and Anaerobic        PRELIM      08/06/12 10:15  08/06/12   No Growth after 1 day/s of incubation.      CULTURE BLOOD AEROBIC AND ANAEROBIC [[id number]] Collected:08/05/12 0754    Specimen Information:Blood / Blood, Venipuncture Updated:08/06/12 1015    Narrative:    X2, from different sites, if not done in ER  ORDER#: [id number]                                    ORDERED BY: HARTOJO, WIBISO  SOURCE: Blood, Venipuncture Arm                      COLLECTED:  08/05/12 07:54  ANTIBIOTICS AT COLL.:                                RECEIVED :  08/05/12 09:55  Culture Blood Aerobic and Anaerobic        PRELIM      08/06/12 10:15  08/06/12   No Growth after 1 day/s of incubation.            Rads:     Radiology Results (24 Hour)     Procedure Component Value Units Date/Time    CT Abdomen Pelvis W IV And PO Cont [[id number]] Collected:08/06/12 1610    Order Status:Completed  Updated:08/06/12 1624    Narrative:    Clinical indications: Nausea vomiting fever. Abdominal pain. Comparison  06/22/2012.    TECHNIQUE: CT of the abdomen and pelvis with IV contrast. No intravenous  contrast was given.    FINDINGS: Exam slightly limited by respiratory motion. The lung bases  are clear. Small hiatal hernia is present.    Hypodensities at the hepatic dome, not significantly changed, likely  cysts or hemangiomas. Patient status post cholecystectomy. No  biliary  dilatation. The portal vein is patent. The spleen, adrenal glands  pancreas and kidneys are normal.    No bowel obstruction. Normal appendix is visualized. No bowel wall  thickening.    Bladder partially distended. Prostate not enlarged. Aorta is normal in  caliber. No adenopathy or ascites. Visualized osseous structures are  unremarkable.      Impression:      1. No localizing inflammatory changes to correlate with the patient's  pain.  2. Other findings as above.    Jasmine December  D'Heureux, MD   08/06/2012 4:20 PM             Signed by: Lambert Mody, MD

## 2012-08-07 NOTE — Progress Notes (Addendum)
[facility] Hospitalist Daily Progress Note        Date Time: 08/07/2012  5:31 PM  Patient Name:[name]  CZY:[id number]  PCP: [name], MD  Attending Physician:Keen Ewalt Glenis Smoker M.D.      Chief Complaint:      Chief Complaint   Patient presents with   . Abdominal Pain       Subjective:     [name] continues to have epigastric abdominal pain with nausea and emesis;small amount of blood in the   vomitus, Afebrile now, No new weakness tingling or numbness, No Cough - SOB.  Assessment/Plan   Fever; abd tenderness/ pain with intractable nausea and vomiting :severe gastroparesis associated with his diabetes:appreciate GI input ; supportive RX; on PCA for pain control  Hematemesis: likely due to either gastritis or esophagitis. For EGD in am  Aspiration pneumonia?  :On levaquin and flagyl ; ID following  Type 2 diabetes, uncontrolled, with neuropathy :ISS; low dose lantus  GERD (gastroesophageal reflux disease) :ppi  HTN (hypertension):uncontrolled ; start catapress TTS ; resume  norvasc as tolerated ; iv hydralazine prn    DVT Prohylaxis: SCD'S sec to UGI bleed  Code Status: full  Prognosis:guarded  Type of Admission:Inpatient  Estimated Length of Stay (including stay in the ER receiving treatment): IP  Medical Necessity for stay:fever ; intractable NV /abd pain; ugi bleed ;aspiration      Allergies:   No Known Allergies    Physical Exam:    height is 1.93 m (6\' 4" ) and weight is 126.7 kg (279 lb 5.2 oz). His temporal artery temperature is 98 F (36.7 C). His blood pressure is 187/111 and his pulse is 118. His respiration is 14 and oxygen saturation is 97%.   Body mass index is 34.01 kg/(m^2).  Filed Vitals:    08/07/12 0110 08/07/12 0517 08/07/12 1006 08/07/12 1425   BP: 168/103 174/96 168/97 187/111   Pulse: 124 115 104 118   Temp: 99 F (37.2 C) 98.8 F (37.1 C) 98.5 F (36.9 C) 98 F (36.7 C)   TempSrc: Oral Oral Temporal Artery Temporal Artery    Resp: 14 18 16 14    Height:       Weight:       SpO2: 97% 99% 98% 97%     Intake and Output Summary (Last 24 hours) at Date Time    Intake/Output Summary (Last 24 hours) at 08/07/12 1731  Last data filed at 08/07/12 1514   Gross per 24 hour   Intake   1803 ml   Output   1625 ml   Net    178 ml       Exam  Awake Alert, Oriented *3, No new F.N deficits, Normal affect  NC.AT,PERRAL  Supple Neck,No JVD, No cervical lymphadenopathy appriciated.   Symmetrical Chest wall movement, Good air movement bilaterally, CTAB  RRR,No Gallops,Rubs or new Murmurs, No Parasternal Heave  +ve B.Sounds, Abd Soft, epigastric tenderness, No rebound -guarding or rigidity.  No Cyanosis, Clubbing or edema, No new Rash or bruise    Consult Input/Plan     Plan  IP CONSULT TO PAIN MANAGEMENT  [facility] ACUTE PAIN SERVICE TO FOLLOW PATIENT    Medications:     Current Facility-Administered Medications   Medication Dose Route Frequency Last Rate Last Dose   . 0.9%  NaCl infusion   Intravenous Continuous 75 mL/hr at 08/06/12 1225     . acetaminophen (TYLENOL) tablet 650 mg  650 mg Oral Q4H PRN       .  acetaminophen (TYLENOL) tablet 650 mg  650 mg Oral Q6H PRN       . amLODIPine (NORVASC) tablet 5 mg  5 mg Oral Daily   5 mg at 08/07/12 1235   . cloNIDine (CATAPRES-TTS-1) 0.1 MG/24HR 1 patch  1 patch Transdermal Weekly       . dextrose (GLUCOSE) 40 % oral gel 15 g  15 g Oral PRN       . dextrose 50 % bolus 25 mL  25 mL Intravenous PRN       . diphenhydrAMINE (BENADRYL) injection 12.5 mg  12.5 mg Intravenous Q6H PRN       . glucagon (rDNA) (GLUCAGEN) injection 1 mg  1 mg Intramuscular PRN       . hydrALAZINE (APRESOLINE) injection 10 mg  10 mg Intravenous Q6H PRN   10 mg at 08/07/12 1505   . HYDROmorphone (DILAUDID) 0.2 mg/mL in sodium chloride 0.9% 100 mL PCA   Intravenous Continuous   20 mg at 08/06/12 1852   . HYDROmorphone (DILAUDID) injection 1 mg  1 mg Intravenous Q2H PRN   1 mg at 08/06/12 1700   . HYDROmorphone PCA 0.2 mg/mL BOLUS from PCA  0.5 mg  Intravenous Q5 Min PRN       . insulin aspart (NovoLOG) injection 1-5 Units  1-5 Units Subcutaneous TID AC   1 Units at 08/07/12 1236   . insulin glargine (LANTUS) injection 10 Units  10 Units Subcutaneous QHS   10 Units at 08/06/12 2158   . ipratropium-albuterol (DUO-NEB) 0.5-2.5(3) mg/3 mL nebulizer 3 mL  3 mL Nebulization Q6H PRN       . ketorolac (TORADOL) injection 15 mg  15 mg Intravenous Q6H PRN   15 mg at 08/05/12 1013   . levofloxacin (LEVAQUIN) 750mg  in D5W IVPB (premix)  750 mg Intravenous Daily   750 mg at 08/06/12 2158   . LORazepam (ATIVAN) injection 0.5 mg  0.5 mg Intravenous Once       . metroNIDAZOLE (FLAGYL) 500mg  in NS IVPB (premix)  500 mg Intravenous Q8H 100 mL/hr at 08/07/12 1137 500 mg at 08/07/12 1137   . naloxone (NARCAN) injection 0.1 mg  0.1 mg Intravenous PRN       . ondansetron (ZOFRAN) injection 4 mg  4 mg Intravenous Q4H PRN   4 mg at 08/07/12 1505   . [EXPIRED] ondansetron (ZOFRAN) injection 4 mg  4 mg Intravenous Once PRN       . oxyCODONE-acetaminophen (PERCOCET) 5-325 MG per tablet 1 tablet  1 tablet Oral Q4H PRN       . pantoprazole (PROTONIX) injection 40 mg  40 mg Intravenous BID   40 mg at 08/07/12 0958   . promethazine (PHENERGAN) injection 12.5 mg  12.5 mg Intravenous Q4H PRN   12.5 mg at 08/06/12 1501   . [COMPLETED] promethazine (PHENERGAN) injection 6.25 mg  6.25 mg Intravenous Once PRN   6.25 mg at 08/06/12 1940   . traMADol (ULTRAM) tablet 50 mg  50 mg Oral Q6H PRN         Review of Systems:   A comprehensive review of systems has no changes since H&P was obtained except as mentioned in the subjective section.    Labs:     Results     Procedure Component Value Units Date/Time    POCT Glucose [[id number]]  (Abnormal) Collected:08/07/12 1155     POCT Glucose WB 165 (A) mg/dL [id number]:91/47/82 9562    CBC without differential [  [id number]]  (Abnormal) Collected:08/07/12 1120    Specimen Information:Blood / Blood Updated:08/07/12 1132     WBC 9.20      RBC 5.17       Hgb 14.1 g/dL      Hematocrit 35.5 (L) %      MCV 80.7 fL      MCH 27.3 (L) pg      MCHC 33.8 g/dL      RDW 13 %      Platelets 338      MPV 8.8 (L) fL     CULTURE BLOOD AEROBIC AND ANAEROBIC [[id number]] Collected:08/05/12 0753    Specimen Information:Blood / Blood, Venipuncture Updated:08/07/12 1015    Narrative:    X2, from different sites, if not done in ER  ORDER#: [id number]                                    ORDERED BY: HARTOJO, WIBISO  SOURCE: Blood, Venipuncture Arm                      COLLECTED:  08/05/12 07:53  ANTIBIOTICS AT COLL.:                                RECEIVED :  08/05/12 09:55  Culture Blood Aerobic and Anaerobic        PRELIM      08/07/12 10:15  08/06/12   No Growth after 1 day/s of incubation.  08/07/12   No Growth after 2 day/s of incubation.      CULTURE BLOOD AEROBIC AND ANAEROBIC [[id number]] Collected:08/05/12 0754    Specimen Information:Blood / Blood, Venipuncture Updated:08/07/12 1015    Narrative:    X2, from different sites, if not done in ER  ORDER#: [id number]                                    ORDERED BY: HARTOJO, WIBISO  SOURCE: Blood, Venipuncture Arm                      COLLECTED:  08/05/12 07:54  ANTIBIOTICS AT COLL.:                                RECEIVED :  08/05/12 09:55  Culture Blood Aerobic and Anaerobic        PRELIM      08/07/12 10:15  08/06/12   No Growth after 1 day/s of incubation.  08/07/12   No Growth after 2 day/s of incubation.      POCT Glucose [[id number]]  (Abnormal) Collected:08/07/12 0738     POCT Glucose WB 171 (A) mg/dL [id number]:82/99/37 1696    POCT Glucose [[id number]]  (Abnormal) Collected:08/06/12 2106     POCT Glucose WB 187 (A) mg/dL PZWCHEN:27/78/24 2353    M. pneumoniae Ab IgM, EIA [[id number]] Collected:08/05/12 0754     M. pneumoniae Ab IgM, EIA 380 Updated:08/06/12 2133    POCT Glucose [[id number]]  (Abnormal) Collected:08/06/12 1717     POCT Glucose WB 178 (A) mg/dL DTOIZTI:45/80/99 8338          Rads:   Radiological Procedure  reviewed.  Radiology Results (24 Hour)     ** No Results  found for the last 24 hours. **          Multi-Disciplinary Care Input:   Case management notes: No Patient Care Coordination Note on file.     PT/OT/ST notes:     Time spent for evaluation, management and coordination of care:   30 minutes          Signed by: Anselmo Pickler  08/07/2012 5:31 PM

## 2012-08-07 NOTE — Anesthesia Preprocedure Evaluation (Addendum)
Anesthesia Evaluation    AIRWAY    Mallampati: II    TM distance: >3 FB  Neck ROM: full  Mouth Opening:full   CARDIOVASCULAR    cardiovascular exam normal, regular and normal       DENTAL    No notable dental hx     PULMONARY    pulmonary exam normal and clear to auscultation     OTHER FINDINGS              PSS Anesthesia Comments: ? Ischemia on EKG        Anesthesia Plan    ASA 3     general               (EKG SR LAE ? inferolater ischemia)      intravenous induction   Detailed anesthesia plan: general IV            informed consent obtained    Plan discussed with CRNA.

## 2012-08-07 NOTE — Consults (Signed)
Service Date: 08/06/2012     Patient Type: I     CONSULTING PHYSICIAN: Donella Stade MD     REFERRING PHYSICIAN: Anselmo Pickler MD     CONSULTING SERVICE:     Gastroenterology.       IDENTIFICATION AND CHIEF COMPLAINT:  This is a 37 year old male being seen at Dr. Glenis Smoker request for  epigastric abdominal pain, nausea and vomiting.     HISTORY OF PRESENT ILLNESS:  The patient has a history of diabetes with noncompliance with outpatient  regimen, also gastroparesis.  Has been admitted on multiple occasions to  hospitals in [place] for hyperglycemia including DKA that has  been associated with abdominal pain, nausea, and vomiting.  This includes a  recent hospitalization on Jun 17, 2012, for recurrent DKA to [facility].  He was seen during that encounter and improved clinically.   Several days after discharge, he was admitted to the [facility]  for similar signs and symptoms.       The patient recently experienced an exacerbation of his abdominal pain in  the epigastric area, nausea, and vomiting.  They saw small specks of blood.   He has been experiencing variable temperatures at home.     He currently denies any chills, dysphagia, odynophagia, chest pain,  shortness of breath, hematuria, dysuria, hematochezia, or black stool.     PAST MEDICAL HISTORY:  Diabetes with multiple hospitalizations for DKA associated with  noncompliance of medications.  Signs and symptoms have included epigastric  abdominal pain, nausea and vomiting when admitted for DKA.     EGD by Dr. Welton Flakes on October 29, 2010, at which time there was diffuse  gastritis, multiple gastric erosions and hemorrhagic gastritis in the  fundus.  Esophageal erosions consistent with reflux.  Gastric antral  biopsies were unremarkable.  Distal esophageal biopsies show evidence of  Barrett's esophagus and chronic inflammation.  It was negative for  dysplasia.  Gastric biopsies for PyloriTek were negative.     Cholecystectomy  with Dr. Jed Limerick on November 24, 2010.  Gastroparesis,  diabetic neuropathy involving both of his hands and feet.  Hypertension,  hyperlipidemia, left-sided ACL, GERD, rhabdomyolysis, and anemia.      ALLERGIES:  None.     MEDICATIONS:  Norvasc, erythromycin, insulin, Zofran, pantoprazole, sucralfate.     SOCIAL HISTORY:  No history of smoking or regular alcohol use.  He is employed.     FAMILY HISTORY:  Two grandmothers and the father with diabetes.     PHYSICAL EXAMINATION:  VITAL SIGNS:  Blood pressure 170/99, heart rate 94, respiratory rate of 16,  and a T-max of 100.8 degrees F.  SKIN:  No jaundice or significant rash seen.  HEENT:  No icterus or lymphadenopathy.  RESPIRATORY:  No rales, rhonchi, or wheezing.  CARDIOVASCULAR:  Normal S1, S2.  No S3, S4, gallops or rubs.  ABDOMEN:  Soft, nondistended, nontender.  No hepatosplenomegaly or masses  detected.  Of note, the patient had just received a dose of Dilaudid.  EXTREMITIES:  No edema or erythema.     LABORATORY AND RADIOLOGY RESULTS:    These are reviewed in the Epic system.     IMPRESSION AND PLAN:  1.  Epigastric abdominal pain, nausea and vomiting, this has been  presumptively due to severe gastroparesis associated with his diabetes.  In  addition, patient has been admitted on multiple occasions with diabetic  ketoacidosis which is likely contributing.  The patient has a history  of  gastritis as well as esophagitis that is likely reflux associated.  These  may be contributing.     I am recommending to continue current medical regimen.  When felt to be  clinically appropriate, would consider discontinuing Flagyl and switching  to another antimicrobial agent as nausea and vomiting are common side  effect of this particular formulation.  In addition, minimize the use of  narcotic analgesics.     Would consider adding metoclopramide as well as erythromycin with timing  pending his clinical course.     2.  Question of hematemesis, patient had a small amount of  blood in the  vomitus, likely due to either gastritis or esophagitis.  Would recommend  EGD with timing pending his clinical course.  Of note, I discussed the  rationale for EGD as well as the details and risks including reaction to  sedation, bleeding and perforation requiring surgery.  He verbalized  understanding and provided informed consent to proceed.  However, the  patient has pneumonia as indicated on his chest x-ray as well as by  clinical signs and symptoms.  Will proceed with EGD when felt to be  clinically appropriate.  3.  Pneumonia.  This was seen by chest x-ray.  The patient has low-grade  fever, likely associated with pneumonia.  We will defer antibiotic therapy  to the hospitalist.    4.  Diabetes.  Will defer to the hospitalist and his other physicians for  management and appropriate recommendations.    5.  Pain management, will ask the department of anesthesia to provide pain  management counsel for the patient.  Attempt to minimize the use of  narcotic analgesics which would contribute to gastroparesis and other  symptoms described above.           D:  08/07/2012 00:12 AM by Dr. Catheryn Bacon. Jens Som, MD [phone])  T:  08/07/2012 02:46 AM by [id number]      Everlean Cherry: [id number]) (Doc ID: [id number])

## 2012-08-07 NOTE — Consults (Signed)
[facility] Case Management Initial Discharge Planning Assessment     Psychosocial/Demographic information   Who was interviewed, relationship, best contact information - Met w/ pt re: d/c planning. Pt identified his mother, [name] [phone]), as his primary contact.   Cognitive status: Alert and oriented x 3  Pt lives with: Friend in [place]  Type of residence:   Prior level of functioning: Independent w/ ambulation and ADLs. 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 Mother, [name] ([phone])     DME, SNF, Home Care companies   DME currently at home: None  Has the patient been to a SNF in the past? If so, where?: None  Any home care companies - None  Palliative care or hospice involvement - None        Advanced directives on the chart? Pt reports not having any Advance Directives   Healthcare Decision Maker and relationship to patient -  Self    PCP - [name] [phone]  TCM referral needed? No    D/C Clinic appointment needed?  Date, time and Location- N/A  CM offered PCP follow up appointment setup within 72 hrs of discharge. Yes, However, pt prefers to make his own appointment.   Appointment Date and time-       Discharge Needs: No identified d/c needs at this time. CM will continue to monitor status.      Potential Barriers to Discharge: Intractable nausea and vomiting      Discussed Anticipated Discharge Date and Discharge Disposition Possibilities with: Patient.     Pt goals/preferences: Return home    Anticipation of care needs increasing or decreasing over time? Decreasing

## 2012-08-07 NOTE — Progress Notes (Signed)
Received report from night shift nurse. Assessment completed. Pt c/o pain at level 8. Pt has PCA. Pt c/o nausea. Pt was given Zofran at 0953. Will continue to monitor. Call light within reach.

## 2012-08-08 ENCOUNTER — Inpatient Hospital Stay: Payer: BC Managed Care – PPO | Admitting: Anesthesiology

## 2012-08-08 ENCOUNTER — Encounter
Admission: EM | Disposition: A | Discharge status: Home / Self Care | Payer: Self-pay | Source: Home / Self Care | Attending: Family Medicine

## 2012-08-08 ENCOUNTER — Encounter: Payer: Self-pay | Admitting: Anesthesiology

## 2012-08-08 ENCOUNTER — Ambulatory Visit: Payer: Self-pay

## 2012-08-08 HISTORY — PX: EGD: SHX3789

## 2012-08-08 LAB — CBC
Hematocrit: 42.1 % (ref 42.0–52.0)
Hgb: 14.2 g/dL (ref 13.0–17.0)
MCH: 27.1 pg — ABNORMAL LOW (ref 28.0–32.0)
MCHC: 33.7 g/dL (ref 32.0–36.0)
MCV: 80.3 fL (ref 80.0–100.0)
MPV: 8.9 fL — ABNORMAL LOW (ref 9.4–12.3)
Platelets: 369 (ref 140–400)
RBC: 5.24 (ref 4.70–6.00)
RDW: 13 % (ref 12–15)
WBC: 9.6 (ref 3.50–10.80)

## 2012-08-08 LAB — BASIC METABOLIC PANEL
Anion Gap: 15 (ref 5.0–15.0)
BUN: 12.5 mg/dL (ref 9.0–21.0)
CO2: 23 (ref 22–29)
Calcium: 9.3 mg/dL (ref 8.5–10.5)
Chloride: 103 (ref 98–107)
Creatinine: 1.3 mg/dL (ref 0.7–1.3)
Glucose: 216 mg/dL — ABNORMAL HIGH (ref 70–100)
Potassium: 3.8 (ref 3.5–5.1)
Sodium: 141 (ref 136–145)

## 2012-08-08 LAB — POCT GLUCOSE
Whole Blood Glucose POCT: 202 mg/dL — AB (ref 70–100)
Whole Blood Glucose POCT: 220 mg/dL — AB (ref 70–100)
Whole Blood Glucose POCT: 209 mg/dL — AB (ref 70–100)
Whole Blood Glucose POCT: 213 mg/dL — AB (ref 70–100)

## 2012-08-08 LAB — GFR: EGFR: >60

## 2012-08-08 SURGERY — DONT USE, USE 1095-ESOPHAGOGASTRODUODENOSCOPY (EGD), DIAGNOSTIC
Anesthesia: Anesthesia General | Site: Anus | Wound class: Clean Contaminated

## 2012-08-08 MED ORDER — PROPOFOL 10 MG/ML IV EMUL
INTRAVENOUS | Status: DC | PRN
Start: 2012-08-08 — End: 2012-08-08
  Administered 2012-08-08: 50 mg via INTRAVENOUS
  Administered 2012-08-08: 150 mg via INTRAVENOUS

## 2012-08-08 MED ORDER — FENTANYL CITRATE 0.05 MG/ML IJ SOLN
INTRAMUSCULAR | Status: AC
Start: 2012-08-08 — End: ?
  Filled 2012-08-08: qty 2

## 2012-08-08 MED ORDER — METOPROLOL TARTRATE 1 MG/ML IV SOLN
5.00 mg | Freq: Four times a day (QID) | INTRAVENOUS | Status: DC
Start: 2012-08-08 — End: 2012-08-09
  Administered 2012-08-08 – 2012-08-09 (×5): 5 mg via INTRAVENOUS
  Filled 2012-08-08 (×5): qty 5

## 2012-08-08 MED ORDER — LIDOCAINE HCL (PF) 2 % IJ SOLN
INTRAMUSCULAR | Status: AC
Start: 2012-08-08 — End: ?
  Filled 2012-08-08: qty 5

## 2012-08-08 MED ORDER — PROPOFOL INFUSION 10 MG/ML
INTRAVENOUS | Status: DC | PRN
Start: 2012-08-08 — End: 2012-08-08
  Administered 2012-08-08: 300 ug/kg/min via INTRAVENOUS

## 2012-08-08 MED ORDER — PROPOFOL 10 MG/ML IV EMUL
INTRAVENOUS | Status: AC
Start: 2012-08-08 — End: ?
  Filled 2012-08-08: qty 40

## 2012-08-08 MED ORDER — PROMETHAZINE HCL 25 MG/ML IJ SOLN
6.25 mg | Freq: Four times a day (QID) | INTRAMUSCULAR | Status: DC | PRN
Start: 2012-08-08 — End: 2012-08-10
  Administered 2012-08-08: 6.25 mg via INTRAVENOUS

## 2012-08-08 MED ORDER — METOPROLOL TARTRATE 1 MG/ML IV SOLN
5.0000 mg | Freq: Once | INTRAVENOUS | Status: DC
Start: 2012-08-08 — End: 2012-08-08

## 2012-08-08 MED ORDER — FENTANYL CITRATE 0.05 MG/ML IJ SOLN
INTRAMUSCULAR | Status: DC | PRN
Start: 2012-08-08 — End: 2012-08-08
  Administered 2012-08-08 (×2): 50 ug via INTRAVENOUS

## 2012-08-08 MED ORDER — MIDAZOLAM HCL 2 MG/2ML IJ SOLN
INTRAMUSCULAR | Status: AC
Start: 2012-08-08 — End: ?
  Filled 2012-08-08: qty 2

## 2012-08-08 MED ORDER — LIDOCAINE HCL 2 % IJ SOLN
INTRAMUSCULAR | Status: DC | PRN
Start: 2012-08-08 — End: 2012-08-08
  Administered 2012-08-08: 150 mg via INTRAVENOUS

## 2012-08-08 MED ORDER — METOCLOPRAMIDE HCL 5 MG/ML IJ SOLN
10.0000 mg | Freq: Four times a day (QID) | INTRAMUSCULAR | Status: DC
Start: 2012-08-08 — End: 2012-08-10
  Administered 2012-08-08 – 2012-08-10 (×7): 10 mg via INTRAVENOUS
  Filled 2012-08-08 (×7): qty 2

## 2012-08-08 MED ORDER — PROPOFOL 10 MG/ML IV EMUL
INTRAVENOUS | Status: AC
Start: 2012-08-08 — End: ?
  Filled 2012-08-08: qty 20

## 2012-08-08 MED ORDER — CLONIDINE HCL 0.1 MG PO TABS
0.1000 mg | ORAL_TABLET | Freq: Once | ORAL | Status: AC
Start: 2012-08-08 — End: 2012-08-08
  Administered 2012-08-08: 0.1 mg via ORAL
  Filled 2012-08-08: qty 1

## 2012-08-08 SURGICAL SUPPLY — 49 items
BASIN EME PLS 700ML LF GRAD TRNLU PGMNT (Patient Supply) ×2
BASIN EMESIS 700 ML GRADUATED TRANSLUCENT PIGMENT FREE PLASTIC (Patient Supply) ×1 IMPLANT
CATHETER BALLOON DILATATION CRE PEBAX (Balloon) IMPLANT
CATHETER ELHMST HMGLD INJ GLDPRB 10FR 25 (Catheter Micellaneous) ×1
CATHETER OD10 FR ODSEC25 GA ID.24 MM (Catheter Micellaneous) ×1
CATHETER OD10 FR ODSEC25 GA ID.24 MM L210 CM BIPOLAR ROUND DISTAL TIP (Catheter Miscellaneous) ×1 IMPLANT
CATHETER OD6 FR ODSEC12-13.5-15 MM L180 CM CRE BALLOON DILATATION L8 (Balloon) IMPLANT
CATHETER OD6 FR ODSEC12-13.5-15 MM L180 CM CREâ„¢ BALLOON DILATATION L8 (Balloon) IMPLANT
CLIP QUICKCLIP2 GI FIXATN LONG (Clip) IMPLANT
CONTAINER HISTOLOGY 60 ML 30 ML GRADUATE LEAK RESISTANT O RING PREFILL (Procedure Accessories) IMPLANT
DILATOR ESCP PEBAX CRE 6FR 12-13.5-15MM (Balloon) IMPLANT
FORCEPS BIOPSY L240 CM +2.8 MM HOT OD2.2 (Endoscopic Supplies)
FORCEPS BIOPSY L240 CM +2.8 MM HOT OD2.2 MM RADIAL JAW (Endoscopic Supplies) IMPLANT
FORCEPS BIOPSY L240 CM JUMBO MICROMESH (Instrument)
FORCEPS BIOPSY L240 CM JUMBO MICROMESH TEETH STREAMLINE CATHETER (Instrument) IMPLANT
FORCEPS BIOPSY L240 CM LARGE CAPACITY (Instrument) ×1
FORCEPS BIOPSY L240 CM MICROMESH TEETH STREAMLINE CATHETER NEEDLE (Instrument) IMPLANT
FORCEPS BIOPSY L240 CM STANDARD CAPACITY (Instrument)
FORCEPS BX +2.8MM RJ 4 2.2MM 240CM HOT (Endoscopic Supplies)
FORCEPS BX SS JMB RJ 4 2.8MM 240CM STRL (Instrument)
FORCEPS BX SS LG CPC RJ 4 2.4MM 240CM (Instrument) ×1
FORCEPS BX STD CPC RJ 4 2.2MM 240CM STRL (Instrument)
FORCEPS RADIAL JAW 3 W/ NEEDLE (Endoscopic Supplies) IMPLANT
GOWN ISO YELLOW UNIVERSAL (Gown) ×4 IMPLANT
KIT SMARTPREP APC 30 30ML (Kits) IMPLANT
LINER SCT 1500CC THNWL MDVC FLX ADV LF (Suction) IMPLANT
MARKER ENDOSCOPIC PERMANENT INDICATION (Syringes, Needles) ×1
MARKER ENDOSCOPIC PERMANENT INDICATION DARK SYRINGE SPOT EX 5 ML (Syringes, Needles) ×1 IMPLANT
MARKER ESCP 5ML SPOT EX PERM INDCT DRK (Syringes, Needles) ×1
NEEDLE INJC DISP NM LOWER GI (Needles) IMPLANT
NET SPEC RTRVL STD RTHNT 2.5MM 230CM LF (Urology Supply)
NET SPECIMEN RETRIEVAL L230 CM STANDARD (Urology Supply)
NET SPECIMEN RETRIEVAL L230 CM STANDARD SHEATH OD2.5 MM L6 CM X W3 CM (Urology Supply) IMPLANT
PAD ELECTROSRG GRND REM W CRD (Procedure Accessories) IMPLANT
SNARE ESCP MED OVL CPTFLX 2.4MM 240CM (Endoscopic Supplies)
SNARE ESCP MIC CPTVTR 13MM 240IN STRL (GE Lab Supplies)
SNARE MD OVAL 240CM 2.4 MM CPTFLX LP FLXBL ENDOSCOPIC POLYPECTOMY 27MM (Endoscopic Supplies) IMPLANT
SNARE MEDIUM OVAL L240 CM OD2.4 MM (Endoscopic Supplies)
SNARE SMALL HEXAGON CAPTIVATOR STIFF ENDOSCOPIC POLYPECTOMY (GE Lab Supplies) IMPLANT
SOL FORMALIN 10% PREFILL 30ML (Procedure Accessories)
SOLN LUBRICATING JELLY 4.25OZ (Irrigation Solutions) ×2 IMPLANT
SPEEDBAND SUPERVIEW SUPER (Endoscopic Supplies) ×2 IMPLANT
SPONGE GAUZE L4 IN X W4 IN 4 PLY HIGH (Sponge) ×1
SPONGE GAUZE L4 IN X W4 IN 4 PLY NONWOVEN LINT FREE CURITY RAYON (Sponge) ×1 IMPLANT
SPONGE GZE RYN PLSTR CRTY 4X4IN LF NS 4 (Sponge) ×1
SYRINGE 50 ML GRADUATE NONPYROGENIC DEHP (Syringes, Needles) ×1
SYRINGE 50 ML GRADUATE NONPYROGENIC DEHP FREE PVC FREE BD MEDICAL (Syringes, Needles) ×1 IMPLANT
SYRINGE MED 50ML LF STRL GRAD N-PYRG (Syringes, Needles) ×1
TRAP SPECIMEN LF (Procedure Accessories) IMPLANT

## 2012-08-08 NOTE — Transfer of Care (Addendum)
Anesthesia Transfer of Care Note    Patient: [name]    Procedures performed: Procedure(s) with comments:  EGD    Anesthesia type: General TIVA.    Patient location:Phase I PACU    Last vitals:   Filed Vitals:    07/01/12 @ 1724   BP: 90/50   Pulse: 104, ST   Temp: 97.8 F    Resp: 20, LS clear   SpO2: 98% @ 4LPM NC       Post pain: Patient not complaining of pain, continue current therapy      Mental Status:sedated.    Respiratory Function: tolerating nasal cannula    Cardiovascular: stable    Nausea/Vomiting: patient not complaining of nausea or vomiting    Hydration Status: adequate    Post assessment: no apparent anesthetic complications. Handed off stable patient to [facility], Environmental manager. Reported intraop events, medical history and meds given. RN made aware of ongoing PCA. VSS.

## 2012-08-08 NOTE — Progress Notes (Signed)
Pt ambulated around the unit this evening. He walked about two times around the loop.

## 2012-08-08 NOTE — Progress Notes (Signed)
[facility] Hospitalist Daily Progress Note        Date Time: 08/08/2012  10:27 AM  Patient Name:[name]  ZOX:[id number]  PCP: [name], MD  Attending Physician:Marquail Bradwell Glenis Smoker M.D.      Chief Complaint:      Chief Complaint   Patient presents with   . Abdominal Pain       Subjective:     [name] doesnt feel any better ; continues to have  epigastric abdominal pain with nausea and emesis;no further blood in vomitus, Afebrile; No new weakness tingling or numbness, No Cough - SOB.  Assessment/Plan   Persistent abd pain with intractable nausea and vomiting :severe gastroparesis associated with his diabetes:  CT abd unremarkable  For EGD today ;appreciate GI input ; supportive RX; consider reglan   on PCA for pain control per anesthesia  Hematemesis: likely due to either gastritis or esophagitis. Hb stable; For EGD today  Aspiration pneumonia  :On levaquin and flagyl ; ID following  Fever :resolved ; most likey sec to above; cultures negative  Type 2 diabetes, uncontrolled, with neuropathy :ISS; lantus 10 units ; titrate prn  GERD (gastroesophageal reflux disease) :ppi  HTN (hypertension):uncontrolled ; on catapress TTS ; resume  norvasc as tolerated ; iv hydralazine and metoprolol prn    DVT Prohylaxis: SCD'S sec to UGI bleed  Code Status: full  Prognosis:guarded  Type of Admission:Inpatient  Estimated Length of Stay (including stay in the ER receiving treatment): IP  Medical Necessity for stay: intractable NV /abd pain; ugi bleed ;aspiration      Allergies:   No Known Allergies    Physical Exam:    height is 1.93 m (6\' 4" ) and weight is 126.7 kg (279 lb 5.2 oz). His temporal artery temperature is 97.4 F (36.3 C). His blood pressure is 188/102 and his pulse is 107. His respiration is 14 and oxygen saturation is 98%.   Body mass index is 34.01 kg/(m^2).  Filed Vitals:    08/07/12 2110 08/08/12 0152 08/08/12 0529 08/08/12 0956   BP: 185/108 162/114  177/93 188/102   Pulse: 118 139 107 107   Temp: 98.9 F (37.2 C) 98.8 F (37.1 C) 98.3 F (36.8 C) 97.4 F (36.3 C)   TempSrc: Oral Oral Oral Temporal Artery   Resp: 16 14 14 14    Height:       Weight:       SpO2: 97% 96% 97% 98%     Intake and Output Summary (Last 24 hours) at Date Time    Intake/Output Summary (Last 24 hours) at 08/08/12 1027  Last data filed at 08/08/12 0800   Gross per 24 hour   Intake   1803 ml   Output    825 ml   Net    978 ml       Exam  Awake Alert, Oriented *3, No new F.N deficits, Normal affect  NC.AT,PERRAL  Supple Neck,No JVD, No cervical lymphadenopathy appriciated.   Symmetrical Chest wall movement, Good air movement bilaterally, CTAB  RRR,No Gallops,Rubs or new Murmurs, No Parasternal Heave  +ve B.Sounds, Abd Soft, epigastric tenderness, No rebound -guarding or rigidity.  No Cyanosis, Clubbing or edema, No new Rash or bruise    Consult Input/Plan     Plan  IP CONSULT TO PAIN MANAGEMENT  [facility] ACUTE PAIN SERVICE TO FOLLOW PATIENT    Medications:     Current Facility-Administered Medications   Medication Dose Route Frequency Last Rate Last Dose   .  0.9%  NaCl infusion   Intravenous Continuous 125 mL/hr at 08/07/12 2133     . acetaminophen (TYLENOL) tablet 650 mg  650 mg Oral Q4H PRN       . acetaminophen (TYLENOL) tablet 650 mg  650 mg Oral Q6H PRN       . amLODIPine (NORVASC) tablet 5 mg  5 mg Oral Daily   5 mg at 08/07/12 1235   . cloNIDine (CATAPRES-TTS-1) 0.1 MG/24HR 1 patch  1 patch Transdermal Weekly   1 patch at 08/07/12 1740   . dextrose (GLUCOSE) 40 % oral gel 15 g  15 g Oral PRN       . dextrose 50 % bolus 25 mL  25 mL Intravenous PRN       . diphenhydrAMINE (BENADRYL) injection 12.5 mg  12.5 mg Intravenous Q6H PRN       . glucagon (rDNA) (GLUCAGEN) injection 1 mg  1 mg Intramuscular PRN       . hydrALAZINE (APRESOLINE) injection 10 mg  10 mg Intravenous Q6H PRN   10 mg at 08/08/12 0606   . HYDROmorphone (DILAUDID) 0.2 mg/mL in sodium chloride 0.9% 100 mL PCA   Intravenous  Continuous   20 mg at 08/06/12 1852   . HYDROmorphone (DILAUDID) injection 1 mg  1 mg Intravenous Q2H PRN   1 mg at 08/06/12 1700   . HYDROmorphone PCA 0.2 mg/mL BOLUS from PCA  0.5 mg Intravenous Q5 Min PRN       . insulin aspart (NovoLOG) injection 1-5 Units  1-5 Units Subcutaneous TID AC   2 Units at 08/08/12 0755   . insulin glargine (LANTUS) injection 10 Units  10 Units Subcutaneous QHS   10 Units at 08/07/12 2156   . ipratropium-albuterol (DUO-NEB) 0.5-2.5(3) mg/3 mL nebulizer 3 mL  3 mL Nebulization Q6H PRN       . ketorolac (TORADOL) injection 15 mg  15 mg Intravenous Q6H PRN   15 mg at 08/05/12 1013   . levofloxacin (LEVAQUIN) 750mg  in D5W IVPB (premix)  750 mg Intravenous Daily   750 mg at 08/07/12 2256   . [COMPLETED] LORazepam (ATIVAN) injection 0.5 mg  0.5 mg Intravenous Once   0.5 mg at 08/07/12 1741   . metoprolol (LOPRESSOR) injection 5 mg  5 mg Intravenous Once       . metroNIDAZOLE (FLAGYL) 500mg  in NS IVPB (premix)  500 mg Intravenous Q8H 100 mL/hr at 08/08/12 0333 500 mg at 08/08/12 0333   . naloxone [facility]) injection 0.1 mg  0.1 mg Intravenous PRN       . ondansetron (ZOFRAN) injection 4 mg  4 mg Intravenous Q4H PRN   4 mg at 08/08/12 0902   . oxyCODONE-acetaminophen (PERCOCET) 5-325 MG per tablet 1 tablet  1 tablet Oral Q4H PRN       . pantoprazole (PROTONIX) injection 40 mg  40 mg Intravenous BID   40 mg at 08/07/12 2133   . promethazine (PHENERGAN) injection 12.5 mg  12.5 mg Intravenous Q4H PRN   12.5 mg at 08/06/12 1501   . traMADol (ULTRAM) tablet 50 mg  50 mg Oral Q6H PRN         Review of Systems:   A comprehensive review of systems has no changes since H&P was obtained except as mentioned in the subjective section.    Labs:     Results     Procedure Component Value Units Date/Time    CULTURE BLOOD AEROBIC AND ANAEROBIC [[id number]]  Collected:08/05/12 0753    Specimen Information:Blood / Blood, Venipuncture Updated:08/08/12 1015    Narrative:    X2, from different sites, if  not done in ER  ORDER#: [id number]                                    ORDERED BY: HARTOJO, WIBISO  SOURCE: Blood, Venipuncture Arm                      COLLECTED:  08/05/12 07:53  ANTIBIOTICS AT COLL.:                                RECEIVED :  08/05/12 09:55  Culture Blood Aerobic and Anaerobic        PRELIM      08/08/12 10:15  08/06/12   No Growth after 1 day/s of incubation.  08/07/12   No Growth after 2 day/s of incubation.  08/08/12   No Growth after 3 day/s of incubation.      CULTURE BLOOD AEROBIC AND ANAEROBIC [[id number]] Collected:08/05/12 0754    Specimen Information:Blood / Blood, Venipuncture Updated:08/08/12 1015    Narrative:    X2, from different sites, if not done in ER  ORDER#: [id number]                                    ORDERED BY: HARTOJO, WIBISO  SOURCE: Blood, Venipuncture Arm                      COLLECTED:  08/05/12 07:54  ANTIBIOTICS AT COLL.:                                RECEIVED :  08/05/12 09:55  Culture Blood Aerobic and Anaerobic        PRELIM      08/08/12 10:15  08/06/12   No Growth after 1 day/s of incubation.  08/07/12   No Growth after 2 day/s of incubation.  08/08/12   No Growth after 3 day/s of incubation.      POCT Glucose [[id number]]  (Abnormal) Collected:08/08/12 0725     POCT Glucose WB 202 (A) mg/dL JOACZYS:08/12/14 0109    POCT Glucose [[id number]]  (Abnormal) Collected:08/07/12 2110     POCT Glucose WB 175 (A) mg/dL GURKYHC:62/37/62 8315    POCT Glucose [[id number]]  (Abnormal) Collected:08/07/12 0906     POCT Glucose WB 175 (A) mg/dL TGGYIRS:85/46/27 0350    POCT Glucose [[id number]]  (Abnormal) Collected:08/07/12 1155     POCT Glucose WB 165 (A) mg/dL KXFGHWE:99/37/16 9678    CBC without differential [[id number]]  (Abnormal) Collected:08/07/12 1120    Specimen Information:Blood / Blood Updated:08/07/12 1132     WBC 9.20      RBC 5.17      Hgb 14.1 g/dL      Hematocrit 02.5 (L) %      MCV 80.7 fL      MCH 27.3 (L) pg      MCHC 33.8 g/dL      RDW 13 %      Platelets 338       MPV 8.8 (L) fL  Rads:   Radiological Procedure reviewed.  Radiology Results (24 Hour)     ** No Results found for the last 24 hours. **          Multi-Disciplinary Care Input:   Case management notes: No Patient Care Coordination Note on file.     PT/OT/ST notes:     Time spent for evaluation, management and coordination of care:   30 minutes          Signed by: Anselmo Pickler  08/08/2012 10:27 AM

## 2012-08-08 NOTE — Anesthesia Postprocedure Evaluation (Signed)
At the conclusion of the procedure, the patient was uneventfully transported to the PACU in stable condition with good ventilatory exchange. Uneventful transition to PACU care.    At this time the patient is awake/easily arousable. The patient's respirations, and cardiovascular status have been evaluated and deemed stable. Post op nausea, vomiting and pain are being evaluated, treated and controlled as effectively as possible without compromising the patients respiratory and cardiovascular status.    Review of input and output information, assessment of cardiovascular course and current physical findings are consistent with adequate hydrational support. Please refer to PACU nursing documentation for confirmation of attainment of normothermia.    There were no anesthetic-related complications evident at this time.    The patient is recovering well and a smooth transition to the next phase of care is anticipated.

## 2012-08-08 NOTE — Progress Notes (Signed)
Nutritional Support Services  Nutrition Assessment    [name] 37 y.o. male   MRN: [id number]    Referral Source:RD screen  Reason for Referral:LOS    Nutrition Summary/Diet history:  Pt has had gastroparesis and diabetic education on previous admission.    Noted history of elevated Hgb A1C, question if pt has ever had controlled diabetes due to readmissions for DKA.  Nutrition Diagnosis:   Inadequate oral intake related to altered GI function as evidenced by vomiting bile/npo status.      Not ready for diet/lifestyle change related to uncontrolled DM as evidenced by DKA, hx of elevated Hgb A1C, accuchecks from 165-220.    Intervention:  1. Recommend if unable to advance diet in 72 hrs to consider start nutrition support.  2. Advance diet as tolerated per MD to Consistent carb/Low fat/Low fiber diet     Goal:Pt to be able to tolerate diet without nausea/vomiting by discharge.    Monitoring:   Evaluation:   1. PO intake   Npo x 4 days  2. Weights   3% wt loss in 1 month, question how much due to fluid loss  3. GI symptoms  Nausea/vomiting    Nutrition risk level:high    Assessment Data:  Adm dx:  Pneumonia   Patient Active Problem List   Diagnosis   . DKA (diabetic ketoacidoses)   . GERD (gastroesophageal reflux disease)   . Gastroparesis   . Dehydration, moderate   . Gastritis without bleeding   . AKI (acute kidney injury)   . Type 2 diabetes, uncontrolled, with neuropathy   . Hypertension   . Nausea & vomiting   . Pneumonia   . Intractable vomiting   . Abdominal pain   . DM (diabetes mellitus)   . HTN (hypertension)   . UGI bleed     PMH:  has a past medical history of Diabetes; Hypertensive disorder; and Gastroparesis.  PSH:  has past surgical history that includes Cholecystectomy.  Pertinent labs:  Lab 08/08/12 1036 08/05/12 0754 08/04/12 1939 08/03/12 2302   NA 141 143 141 140   K 3.8 3.8 3.9 4.1   CL 103 106 101 102   CO2 23 27 25 24    BUN 12.5 10.6 10.4 9.0   CREAT 1.3 1.1 1.3 1.1   GLU 216* 176* 225*  247*   CA 9.3 9.0 9.4 9.0   MG -- 2.0 -- --   PHOS -- -- -- --     Pertinent meds:  Current Facility-Administered Medications   Medication Dose Route Frequency   . amLODIPine  5 mg Oral Daily   . cloNIDine  1 patch Transdermal Weekly   . insulin aspart  1-5 Units Subcutaneous TID AC   . insulin glargine  10 Units Subcutaneous QHS   . levofloxacin  750 mg Intravenous Daily   . [COMPLETED] LORazepam  0.5 mg Intravenous Once   . metoprolol  5 mg Intravenous Q6H   . metroNIDAZOLE  500 mg Intravenous Q8H   . pantoprazole  40 mg Intravenous BID   . [DISCONTINUED] metoprolol  5 mg Intravenous Once     Social history:  Single  Food Intolerance/Religious/Ethnic preferences:N/A  Diet Order:  Orders Placed This Encounter   Procedures   . Diet NPO effective now Except for: SIPS WITH MEDS      Food intake: npo  GI symptoms:WDL  Hydration:WDL      . sodium chloride 125 mL/hr at 08/07/12 2133   . HYDROmorphone  20 mg (08/06/12 1852)     Skin:intact  Anthropometrics  Height: 193 cm (6\' 4" )  Weight: 126.7 kg (279 lb 5.2 oz)  Weight Change: -0.24   IBW/kg (Calculated) Male: 91.86 kg  IBW/kg (Calculated) Male: 81.78 kg  BMI (calculated): 34.1   Wt Readings from Last 30 Encounters:   08/05/12 126.7 kg (279 lb 5.2 oz)   08/05/12 126.7 kg (279 lb 5.2 oz)   06/30/12 134.038 kg (295 lb 8 oz)   06/17/12 127.1 kg (280 lb 3.3 oz)     Learning Needs: May need diabetic diet education prior to discharge.             No Known Allergies    Caprice Renshaw, MS, RD  Ext. [phone]

## 2012-08-08 NOTE — Op Note (Signed)
Procedure Date: 08/08/2012     Patient Type: I     SURGEON: Donella Stade MD  ASSISTANT:       PREOPERATIVE DIAGNOSIS:          POSTOPERATIVE DIAGNOSES:  1.  Random biopsies taken from the second portion of the duodenum and  duodenal bulb.  2.  Gastropathy throughout the stomach, biopsied.  3.  Irregular squamocolumnar junction, biopsied.  4.  Random esophageal biopsies.       TITLE OF PROCEDURE:  Esophagogastroduodenoscopy with biopsies.     INDICATION:  Epigastric pain, nausea, and vomiting.     MEDICATIONS:  TIVA.     DESCRIPTION OF PROCEDURE:  The patient was placed in the left lateral decubitus position.  After  adequate sedation was achieved, an adult Olympus gastroscope was advanced  to the second portion of the duodenum.     Upon withdrawal, there was adequate visualization of the second portion of  the duodenum, duodenal bulb, stomach, and esophagus.  Retroflexed exam  looking into the gastric fundus was also performed.     FINDINGS:  Significant for an irregular squamocolumnar junction at 40 cm from the  incisors.  This is characterized by jagged edges at the squamocolumnar  junction.  In addition, a single island of pink tissue adjacent to the  squamocolumnar junction.  Multiple biopsies were taken from this area and  submitted for pathologic evaluation.  Areas of the esophagus proximal to  this region were unremarkable.  Random biopsies were taken throughout the  esophagus and submitted for pathologic evaluation.     There were small patches of erythema in the gastric fundus.  Erythematous  thick fold in the gastric body.  Also, patches of erythema in the gastric  antrum.  Biopsies were taken from all areas of the stomach and submitted in  the same specimen jar.     Exams of the duodenal bulb and second portion of the duodenum were  unremarkable.  Random biopsies were taken from each of these areas and  submitted for pathologic evaluation.          IMPRESSION AND PLAN:  1.  Epigastric abdominal pain,  nausea and vomiting.  There was no evidence  of partial gastric outlet obstruction or other similar abnormality that  would be a contributing factor or cause.  The patient had diffuse  gastropathy, which could reflect gastritis that could be contributing.     I recommend for the patient to follow up with me regarding gastric biopsies  taken.  If there is evidence of H. pylori-associated gastritis, I would  recommend appropriate antimicrobial therapy.     The epigastric pain, nausea and vomiting is likely predominately due to  gastroparesis associated with diabetes.  When the patient is hospitalized,  he receives narcotic analgesics, which are likely contributing.  Recommend  to start metoclopramide 10 mg IV q.6 h.  Pending his clinical course, could  consider erythromycin IV.  2.  Irregular squamocolumnar junction.  If biopsies confirm Barrett's  without dysplasia, recommend a repeat EGD in 3 years.  If biopsies are  unremarkable, I would still recommend a repeat EGD in 3 years.  This is  taking into consideration his history of Barrett esophagus.     In addition, I would recommend dietary modifications to reduce GERD.  In  addition, daily proton pump inhibitor therapy.  3.  The patient is to follow up with me regarding duodenal biopsies to  assess for celiac disease and parasitic  infection.  4.  The patient is to follow up with me regarding random esophageal  biopsies to assess for eosinophilia in the esophagus.  If present, consider  referral for food allergy assessment.           D:  08/08/2012 17:34 PM by Dr. Catheryn Bacon. Jens Som, MD [phone])  T:  08/08/2012 20:55 PM by Gwenyth Bender      Everlean Cherry: [id number]) (Doc ID: [id number])

## 2012-08-08 NOTE — Progress Notes (Signed)
Received from PACU via bed following EGD.  Lethargic, easily aroused.  VSS.  Emesis X1. Will advance diet if pt can tolerate. PCA ongoing.

## 2012-08-08 NOTE — Progress Notes (Signed)
To OR via bed for EGD.

## 2012-08-08 NOTE — Progress Notes (Signed)
AM assessment completed.  VSS with persistent hypertension; PRN meds available to address elevated BP.  Pain is managed with PCA; however, pt c/o persistent nausea with the use of Dilaudid.  Antiemetics available and administered at appropriate intervals.  NPO for anticipated EGD today.  Plan of care reviewed.

## 2012-08-08 NOTE — Progress Notes (Signed)
INFECTIOUS DISEASES PROGRESS NOTE  Lambert Mody        Date Time: 08/08/2012 12:43 PM  Patient Name: [name], [name]    Patient Active Problem List    Diagnosis Date Noted   . UGI bleed 08/07/2012   . Pneumonia 08/05/2012   . Intractable vomiting 08/05/2012   . Abdominal pain 08/05/2012   . DM (diabetes mellitus) 08/05/2012   . HTN (hypertension) 08/05/2012   . Type 2 diabetes, uncontrolled, with neuropathy 06/27/2012   . Hypertension 06/27/2012   . Nausea & vomiting 06/27/2012   . DKA (diabetic ketoacidoses) 06/17/2012   . GERD (gastroesophageal reflux disease) 06/17/2012   . Gastroparesis 06/17/2012   . Dehydration, moderate 06/17/2012   . Gastritis without bleeding 06/17/2012   . AKI (acute kidney injury) 06/17/2012           Assessment:     Intractable vomiting   Abdominal pain- . EGD tomorrow. CT abd unrevealing   Gastroparesis   Fever- w/o leucocytosis.Afebrile x 24 hours.CT abd unrevealing , CXR neg for pneumonia and no resp SX but he is at risk for aspiration given gastroparesis and N/V and being drowsy from pain meds. On Levaquin/flagyl    Type 2 diabetes, uncontrolled, with neuropathy  GERD (gastroesophageal reflux disease)   HTN (hypertension)   Aspiration risk    Plan:   Continue Levaquin and flagyl  empirically.Aspiration precautions.Plan on 7 days total of Abx  Await EGD  Check CBC  Supportive care    Subjective:   [name] today has, No fevers, No chills, No nightsweats,+malaise   No headache, No chest pain,   + upper abdominal pain /Nausea/ vomiting, No diarrhea,No BM   No dysuria, No urgency, No frequency,    No Cough - Reports some DOE     Appetite poor   No new weakness tingling or numbness,   Feeling " not  well"    Antibiotics:   Levaquin #4/7  Flagyl #3  All other medications reviewed    Lines:     Active PICC Line / CVC Line / PIV Line / Drain / Airway / Intraosseous Line / Epidural Line / ART Line / Line / Wound / Pressure Ulcer / NG/OG Tube     Name   Placement date   Placement time    Site   Days    Peripheral IV 08/04/12 Right Forearm  08/04/12   1942   Forearm   1          Physical Exam:   Temp:  [97.4 F (36.3 C)-98.9 F (37.2 C)] 97.4 F (36.3 C)  Heart Rate:  [107-139] 107   Resp Rate:  [14-16] 14   BP: (162-188)/(93-114) 188/102 mmHg        GEN: sleepy but arousable, Oriented x3, No new F.N deficits, Normal affect  HEENT: NC.AT,PERRLA, EOMI. MMM  Neck:Supple Neck,No JVD, No cervical lymphadenopathy appreciated.   Chest: Symmetrical Chest wall movement, Good air movement bilaterally, CTAB  CVS: RRR,No Gallops,Rubs or new Murmurs, No Parasternal Heave  Abd:  +ve B.Sounds, Abd Soft, mildly TTP  upper abd , No organomegaly appriciated, No rebound -guarding or rigidity.  Ext:  No Cyanosis, Clubbing or edema, No new Rash or bruise  Neuro: Grossly non focal          Labs:     Results     Procedure Component Value Units Date/Time    POCT Glucose [[id number]]  (Abnormal) Collected:08/08/12 1226     POCT Glucose WB  220 (A) mg/dL JYNWGNF:62/13/08 6578    Basic Metabolic Panel [[id number]]  (Abnormal) Collected:08/08/12 1036    Specimen Information:Blood Updated:08/08/12 1105     Glucose 216 (H) mg/dL      BUN 41.3 mg/dL      Creatinine 1.3 mg/dL      CALCIUM 9.3 mg/dL      Sodium 244      Potassium 3.8      Chloride 103      CO2 23      Anion Gap 15.0     GFR [[id number]] Collected:08/08/12 1036     EGFR >60.0 Updated:08/08/12 1105    CBC without differential [[id number]]  (Abnormal) Collected:08/08/12 1036    Specimen Information:Blood / Blood Updated:08/08/12 1043     WBC 9.60      RBC 5.24      Hgb 14.2 g/dL      Hematocrit 59.5 %      MCV 80.3 fL      MCH 27.1 (L) pg      MCHC 33.7 g/dL      RDW 13 %      Platelets 369      MPV 8.9 (L) fL     CULTURE BLOOD AEROBIC AND ANAEROBIC [[id number]] Collected:08/05/12 0753    Specimen Information:Blood / Blood, Venipuncture Updated:08/08/12 1015    Narrative:    X2, from different sites, if not done in ER  ORDER#: [id number]                                     ORDERED BY: HARTOJO, WIBISO  SOURCE: Blood, Venipuncture Arm                      COLLECTED:  08/05/12 07:53  ANTIBIOTICS AT COLL.:                                RECEIVED :  08/05/12 09:55  Culture Blood Aerobic and Anaerobic        PRELIM      08/08/12 10:15  08/06/12   No Growth after 1 day/s of incubation.  08/07/12   No Growth after 2 day/s of incubation.  08/08/12   No Growth after 3 day/s of incubation.      CULTURE BLOOD AEROBIC AND ANAEROBIC [[id number]] Collected:08/05/12 0754    Specimen Information:Blood / Blood, Venipuncture Updated:08/08/12 1015    Narrative:    X2, from different sites, if not done in ER  ORDER#: [id number]                                    ORDERED BY: HARTOJO, WIBISO  SOURCE: Blood, Venipuncture Arm                      COLLECTED:  08/05/12 07:54  ANTIBIOTICS AT COLL.:                                RECEIVED :  08/05/12 09:55  Culture Blood Aerobic and Anaerobic        PRELIM      08/08/12 10:15  08/06/12   No Growth after 1 day/s of incubation.  08/07/12   No Growth after  2 day/s of incubation.  08/08/12   No Growth after 3 day/s of incubation.      POCT Glucose [[id number]]  (Abnormal) Collected:08/08/12 0725     POCT Glucose WB 202 (A) mg/dL MVHQION:62/95/28 4132    POCT Glucose [[id number]]  (Abnormal) Collected:08/07/12 2110     POCT Glucose WB 175 (A) mg/dL DGUYQIH:47/42/59 5638    POCT Glucose [[id number]]  (Abnormal) Collected:08/07/12 0906     POCT Glucose WB 175 (A) mg/dL JOACZYS:08/12/14 0109          Rads:     Radiology Results (24 Hour)     ** No Results found for the last 24 hours. **            Signed by: Lambert Mody, MD

## 2012-08-08 NOTE — Progress Notes (Signed)
Assessment completed. Patient continued to c/o nausea and vomiting. Antiemetic administered. Will continued to monitor.

## 2012-08-08 NOTE — Brief Op Note (Signed)
BRIEF OP NOTE  Full Note Dictated    Date Time: 08/08/2012 5:29 PM    Patient Name:   [name]    Date of Operation:   08/04/2012 - 08/08/2012    Providers Performing:   Surgeon(s):  [name], MD    Assistant (s):   Charisse Klinefelter, RN - Circulator  Butts, Delarease - Endo Tech    Operative Procedure:   Procedure(s):  EGD    Preoperative Diagnosis:   Pre-Op Diagnosis Codes:     * Abdominal pain [789.00]    Anesthesia:   Monitor Anesthesia Care    Estimated Blood Loss:   Minimal    Findings:   Irregular SQ at 40 cm biopsied.  Random esophageal biopsies.  Gastropathy throughout stomach biopsied.  Random biopsies from d2 and bulb    Postoperative Diagnosis:   Gastritis.    Complications:   None    Signed by: [name], MD                                                                           [place]

## 2012-08-08 NOTE — SLP Progress Note (Signed)
Speech Language Pathology  Unable to complete bedside swallow evaluation as pt is NPO for prcoede later this date. RN to page SLP re: appropriateness of SLP evaluation after speaking with MD.  Truett Mainland M.S. CCC-SLP   Pager id [phone]

## 2012-08-09 LAB — BASIC METABOLIC PANEL
Anion Gap: 11 (ref 5.0–15.0)
BUN: 14.2 mg/dL (ref 9.0–21.0)
CO2: 28 (ref 22–29)
Calcium: 9.5 mg/dL (ref 8.5–10.5)
Chloride: 103 (ref 98–107)
Creatinine: 1.4 mg/dL — ABNORMAL HIGH (ref 0.7–1.3)
Glucose: 214 mg/dL — ABNORMAL HIGH (ref 70–100)
Potassium: 3.9 (ref 3.5–5.1)
Sodium: 142 (ref 136–145)

## 2012-08-09 LAB — CBC
Hematocrit: 42 % (ref 42.0–52.0)
Hgb: 13.9 g/dL (ref 13.0–17.0)
MCH: 26.7 pg — ABNORMAL LOW (ref 28.0–32.0)
MCHC: 33.1 g/dL (ref 32.0–36.0)
MCV: 80.6 fL (ref 80.0–100.0)
MPV: 8.9 fL — ABNORMAL LOW (ref 9.4–12.3)
Platelets: 367 (ref 140–400)
RBC: 5.21 (ref 4.70–6.00)
RDW: 13 % (ref 12–15)
WBC: 7.52 (ref 3.50–10.80)

## 2012-08-09 LAB — POCT GLUCOSE
Whole Blood Glucose POCT: 198 mg/dL — AB (ref 70–100)
Whole Blood Glucose POCT: 260 mg/dL — AB (ref 70–100)
Whole Blood Glucose POCT: 255 mg/dL — AB (ref 70–100)
Whole Blood Glucose POCT: 234 mg/dL — AB (ref 70–100)

## 2012-08-09 LAB — GFR: EGFR: >60

## 2012-08-09 MED ORDER — METOPROLOL TARTRATE 50 MG PO TABS
50.00 mg | ORAL_TABLET | Freq: Two times a day (BID) | ORAL | Status: DC
Start: 2012-08-09 — End: 2012-08-10
  Administered 2012-08-09 – 2012-08-10 (×3): 50 mg via ORAL
  Filled 2012-08-09 (×3): qty 1

## 2012-08-09 MED ORDER — PANTOPRAZOLE SODIUM 40 MG PO TBEC
40.00 mg | DELAYED_RELEASE_TABLET | Freq: Two times a day (BID) | ORAL | Status: DC
Start: 2012-08-09 — End: 2012-08-10
  Administered 2012-08-09 – 2012-08-10 (×2): 40 mg via ORAL
  Filled 2012-08-09 (×2): qty 1

## 2012-08-09 MED ORDER — HYDROMORPHONE HCL 2 MG PO TABS
2.0000 mg | ORAL_TABLET | Freq: Four times a day (QID) | ORAL | Status: DC | PRN
Start: 2012-08-09 — End: 2012-08-10

## 2012-08-09 NOTE — Progress Notes (Signed)
patient is still sleepy, assessment completed as charted, pain 1/10 , no nausea at present,  Denies any other need, Call bell within reach.

## 2012-08-09 NOTE — Progress Notes (Signed)
Pt is resting in bed, no pain, denies any need at present, call bell within reach.

## 2012-08-09 NOTE — Progress Notes (Signed)
Pt is resting in bed, no pain, denies any need at present,  BP is elevated , medicated with PRN hydralazine call bell within reach.

## 2012-08-09 NOTE — Progress Notes (Signed)
INFECTIOUS DISEASES PROGRESS NOTE  [name]        Date Time: 08/09/2012 11:12 AM  Patient Name: [name], [name]    Patient Active Problem List    Diagnosis Date Noted   . UGI bleed 08/07/2012   . Pneumonia 08/05/2012   . Intractable vomiting 08/05/2012   . Abdominal pain 08/05/2012   . DM (diabetes mellitus) 08/05/2012   . HTN (hypertension) 08/05/2012   . Type 2 diabetes, uncontrolled, with neuropathy 06/27/2012   . Hypertension 06/27/2012   . Nausea & vomiting 06/27/2012   . DKA (diabetic ketoacidoses) 06/17/2012   . GERD (gastroesophageal reflux disease) 06/17/2012   . Gastroparesis 06/17/2012   . Dehydration, moderate 06/17/2012   . Gastritis without bleeding 06/17/2012   . AKI (acute kidney injury) 06/17/2012           Assessment:     Intractable vomiting   Abdominal pain- . EGD tomorrow. CT abd unrevealing , S/P EGD which showed gastritis. Bx taken, path pending.  Gastroparesis   Fever- w/o leucocytosis.Afebrile .CT abd unrevealing , CXR neg for pneumonia and no resp SX but he is at risk for aspiration given gastroparesis and N/V and being drowsy from pain meds. On Levaquin/flagyl    Type 2 diabetes, uncontrolled, with neuropathy  GERD (gastroesophageal reflux disease)   HTN (hypertension)   Aspiration risk    Plan:   Continue Levaquin and flagyl  empirically.Aspiration precautions.Plan on 7 days total of Abx  Supportive care    Subjective:   [name] today has, No fevers, No chills, No nightsweats,+malaise   No headache, No chest pain,   less  upper abdominal pain, NoNausea/ vomiting, No diarrhea,No BM   No dysuria, No urgency, No frequency,    No Cough - Reports no  DOE     Appetite poor   No new weakness tingling or numbness,   Feeling " better"    Antibiotics:   Levaquin #5/7  Flagyl #4  All other medications reviewed    Lines:     Active PICC Line / CVC Line / PIV Line / Drain / Airway / Intraosseous Line / Epidural Line / ART Line / Line / Wound / Pressure Ulcer / NG/OG Tube     Name    Placement date   Placement time   Site   Days    Peripheral IV 08/04/12 Right Forearm  08/04/12   1942   Forearm   1          Physical Exam:   Temp:  [46.4 F (8 C)-100 F (37.8 C)] 98.4 F (36.9 C)  Heart Rate:  [90-121] 92   Resp Rate:  [14-18] 18   BP: (139-208)/(75-111) 174/91 mmHg        GEN: sleepy but arousable, Oriented x3, No new F.N deficits, Normal affect  HEENT: NC.AT,PERRLA, EOMI. MMM  Neck:Supple Neck,No JVD, No cervical lymphadenopathy appreciated.   Chest: Symmetrical Chest wall movement, Good air movement bilaterally, CTAB  CVS: RRR,No Gallops,Rubs or new Murmurs, No Parasternal Heave  Abd:  +ve B.Sounds, Abd Soft, no TTP  upper abd , No organomegaly appriciated, No rebound -guarding or rigidity.  Ext:  No Cyanosis, Clubbing or edema, No new Rash or bruise  Neuro: Grossly non focal          Labs:     Results     Procedure Component Value Units Date/Time    CULTURE BLOOD AEROBIC AND ANAEROBIC [[id number]] Collected:08/05/12 0753    Specimen Information:Blood /  Blood, Venipuncture Updated:08/09/12 1015    Narrative:    X2, from different sites, if not done in ER  ORDER#: [id number]                                    ORDERED BY: HARTOJO, WIBISO  SOURCE: Blood, Venipuncture Arm                      COLLECTED:  08/05/12 07:53  ANTIBIOTICS AT COLL.:                                RECEIVED :  08/05/12 09:55  Culture Blood Aerobic and Anaerobic        PRELIM      08/09/12 10:15  08/06/12   No Growth after 1 day/s of incubation.  08/07/12   No Growth after 2 day/s of incubation.  08/08/12   No Growth after 3 day/s of incubation.  08/09/12   No Growth after 4 day/s of incubation.      CULTURE BLOOD AEROBIC AND ANAEROBIC [[id number]] Collected:08/05/12 0754    Specimen Information:Blood / Blood, Venipuncture Updated:08/09/12 1015    Narrative:    X2, from different sites, if not done in ER  ORDER#: [id number]                                    ORDERED BY: HARTOJO, WIBISO  SOURCE: Blood, Venipuncture Arm                       COLLECTED:  08/05/12 07:54  ANTIBIOTICS AT COLL.:                                RECEIVED :  08/05/12 09:55  Culture Blood Aerobic and Anaerobic        PRELIM      08/09/12 10:15  08/06/12   No Growth after 1 day/s of incubation.  08/07/12   No Growth after 2 day/s of incubation.  08/08/12   No Growth after 3 day/s of incubation.  08/09/12   No Growth after 4 day/s of incubation.      POCT Glucose [[id number]]  (Abnormal) Collected:08/09/12 0750     POCT Glucose WB 198 (A) mg/dL [id number]:34/74/25 9563    Basic Metabolic Panel [[id number]]  (Abnormal) Collected:08/09/12 0641    Specimen Information:Blood Updated:08/09/12 0743     Glucose 214 (H) mg/dL      BUN 51.8 mg/dL      Creatinine 1.4 (H) mg/dL      CALCIUM 9.5 mg/dL      Sodium 841      Potassium 3.9      Chloride 103      CO2 28      Anion Gap 11.0     GFR [[id number]] Collected:08/09/12 0641     EGFR >60.0 Updated:08/09/12 0743    CBC without differential [[id number]]  (Abnormal) Collected:08/09/12 0641    Specimen Information:Blood / Blood Updated:08/09/12 0728     WBC 7.52      RBC 5.21      Hgb 13.9 g/dL      Hematocrit 10.9 %      MCV 80.6 fL  MCH 26.7 (L) pg      MCHC 33.1 g/dL      RDW 13 %      Platelets 367      MPV 8.9 (L) fL     POCT Glucose [[id number]]  (Abnormal) Collected:08/08/12 2151     POCT Glucose WB 213 (A) mg/dL JYNWGNF:62/13/08 6578    POCT Glucose [[id number]]  (Abnormal) Collected:08/08/12 1621    Specimen Information:Blood Updated:08/08/12 1621     POCT Glucose WB 209 (A) mg/dL     POCT Glucose [[id number]]  (Abnormal) Collected:08/08/12 1226     POCT Glucose WB 220 (A) mg/dL UVOZDGU:44/03/47 4259          Rads:     Radiology Results (24 Hour)     ** No Results found for the last 24 hours. **            Signed by: [name] Mody, MD

## 2012-08-09 NOTE — Progress Notes (Signed)
PROGRESS NOTE    Date Time: 08/09/2012 5:31 PM  Patient Name: [name],[name]      Problem List:     Patient Active Problem List   Diagnosis   . DKA (diabetic ketoacidoses)   . GERD (gastroesophageal reflux disease)   . Gastroparesis   . Dehydration, moderate   . Gastritis without bleeding   . AKI (acute kidney injury)   . Type 2 diabetes, uncontrolled, with neuropathy   . Hypertension   . Nausea & vomiting   . Pneumonia   . Intractable vomiting   . Abdominal pain   . DM (diabetes mellitus)   . HTN (hypertension)   . UGI bleed       Subjective:   Patient with no more abdominal pain, nausea or vomiting.  Tolerating solid food.  Ambulating without difficulty.    Medications:     Current Facility-Administered Medications   Medication Dose Route Frequency   . amLODIPine  5 mg Oral Daily   . [COMPLETED] cloNIDine  0.1 mg Oral Once   . cloNIDine  1 patch Transdermal Weekly   . insulin aspart  1-5 Units Subcutaneous TID AC   . insulin glargine  10 Units Subcutaneous QHS   . levofloxacin  750 mg Intravenous Daily   . metoclopramide  10 mg Intravenous Q6H SCH   . metoprolol  50 mg Oral Q12H SCH   . metroNIDAZOLE  500 mg Intravenous Q8H   . pantoprazole  40 mg Oral BID   . [DISCONTINUED] metoprolol  5 mg Intravenous Q6H   . [DISCONTINUED] pantoprazole  40 mg Intravenous BID       Physical Exam:     Filed Vitals:    08/09/12 1633   BP: 152/85   Pulse: 98   Temp:    Resp:    SpO2:        Intake and Output Summary (Last 24 hours) at Date Time    Intake/Output Summary (Last 24 hours) at 08/09/12 1731  Last data filed at 08/09/12 1412   Gross per 24 hour   Intake   2622 ml   Output   1775 ml   Net    847 ml         Labs:       Lab 08/09/12 0641 08/08/12 1036 08/07/12 1120 08/05/12 0754 08/04/12 1939 08/03/12 2302   WBC 7.52 9.60 9.20 6.65 8.78 --   RBC 5.21 5.24 5.17 4.59* 5.08 --   HGB 13.9 14.2 14.1 12.3* 13.8 --   HCT 42.0 42.1 41.7* 37.5* 41.6* --   PLT 367 369 338 286 302 --   GLU 214* 216* -- 176* 225* 247*   BUN 14.2 12.5  -- 10.6 10.4 9.0   CREAT 1.4* 1.3 -- 1.1 1.3 1.1   CA 9.5 9.3 -- 9.0 9.4 9.0   NA 142 141 -- 143 141 140   K 3.9 3.8 -- 3.8 3.9 4.1   CL 103 103 -- 106 101 102   CO2 28 23 -- 27 25 24    ALT -- -- -- 13 14 15    AST -- -- -- 10 13 12    BILITOTAL -- -- -- 0.5 0.6 0.6   BILIDIRECT -- -- -- -- -- --   PROT -- -- -- 5.8* 7.5 6.9   LIP -- -- -- -- 29 28   INR -- -- -- -- -- --   PTT -- -- -- -- -- --  Lab 08/04/12 1939 08/03/12 2302   LIP 29 28   AMY -- --       Rads:     Radiology Results (24 Hour)     ** No Results found for the last 24 hours. **          Assessment:   Epigastric pain, n/v-likely multifactorial.  Gastroparesis, gastritis.  Patient not compliant with medications recommended as outpatient.  Given rx for erythromycin, sucralfate, protonix and reglan .  Did not take erythromycin.  I advised patient to focus on taking protonix and erythromycin.  To hold sucralfate and reglan.  Follow up with me as an outpatient.  Pneumonia-as per hospitalist.  Diabetes-As per hospitalist.    Plan:   As above.    Signed by: [name]

## 2012-08-09 NOTE — Progress Notes (Signed)
[facility] Hospitalist Daily Progress Note        Date Time: 08/09/2012  3:12 PM  Patient Name:[name]  ZOX:[id number]  PCP: [name], MD  Attending Physician:Chanson Teems Glenis Smoker M.D.      Chief Complaint:      Chief Complaint   Patient presents with   . Abdominal Pain       Subjective:     [name] feels much better epigastric abdominal pain and  nausea improved; no emesis today   Afebrile; No new weakness tingling or numbness, No Cough - SOB.  Assessment/Plan   Persistent abd pain with intractable nausea and vomiting :severe gastroparesis associated with his diabetes:  CT abd unremarkable  S/p  EGD :gastritis  ;appreciate GI input ; ppi bid ; started reglan   on PCA for pain control per anesthesia  Hematemesis: likely due to either gastritis or esophagitis. Hb stable  Aspiration pneumonia  :On levaquin and flagyl ; ID following  Fever :resolved ; most likey sec to above; cultures negative  Type 2 diabetes, uncontrolled, with neuropathy :ISS; lantus 10 units ; titrate prn  GERD (gastroesophageal reflux disease) :ppi  HTN (hypertension):uncontrolled ; on catapress TTS ; resume  norvasc as tolerated ; iv hydralazine and metoprolol prn    DVT Prohylaxis: SCD'S sec to UGI bleed  Code Status: full  Prognosis:guarded  Type of Admission:Inpatient  Estimated Length of Stay (including stay in the ER receiving treatment): IP  Medical Necessity for stay: intractable NV /abd pain;aspiration      Allergies:   No Known Allergies    Physical Exam:    height is 1.93 m (6\' 4" ) and weight is 126.7 kg (279 lb 5.2 oz). His temporal artery temperature is 98.2 F (36.8 C). His blood pressure is 169/91 and his pulse is 88. His respiration is 18 and oxygen saturation is 95%.   Body mass index is 34.01 kg/(m^2).  Filed Vitals:    08/09/12 0235 08/09/12 0636 08/09/12 1005 08/09/12 1412   BP: 168/97 165/86 174/91 169/91   Pulse: 115 95 92 88   Temp: 98.7 F (37.1 C) 97.7 F  (36.5 C) 98.4 F (36.9 C) 98.2 F (36.8 C)   TempSrc: Oral Oral Temporal Artery Temporal Artery   Resp: 16 14 18 18    Height:       Weight:       SpO2: 97% 96% 92% 95%     Intake and Output Summary (Last 24 hours) at Date Time    Intake/Output Summary (Last 24 hours) at 08/09/12 1512  Last data filed at 08/09/12 1412   Gross per 24 hour   Intake   2156 ml   Output   1775 ml   Net    381 ml       Exam  Awake Alert, Oriented *3, No new F.N deficits, Normal affect  NC.AT,PERRAL  Supple Neck,No JVD, No cervical lymphadenopathy appriciated.   Symmetrical Chest wall movement, Good air movement bilaterally, CTAB  RRR,No Gallops,Rubs or new Murmurs, No Parasternal Heave  +ve B.Sounds, Abd Soft, epigastric tenderness, No rebound -guarding or rigidity.  No Cyanosis, Clubbing or edema, No new Rash or bruise    Consult Input/Plan     Plan  IP CONSULT TO PAIN MANAGEMENT  [facility] ACUTE PAIN SERVICE TO FOLLOW PATIENT    Medications:     Current Facility-Administered Medications   Medication Dose Route Frequency Last Rate Last Dose   . 0.9%  NaCl infusion  Intravenous Continuous 125 mL/hr at 08/09/12 0930     . acetaminophen (TYLENOL) tablet 650 mg  650 mg Oral Q4H PRN       . acetaminophen (TYLENOL) tablet 650 mg  650 mg Oral Q6H PRN       . amLODIPine (NORVASC) tablet 5 mg  5 mg Oral Daily   5 mg at 08/09/12 1050   . [COMPLETED] cloNIDine (CATAPRES) tablet 0.1 mg  0.1 mg Oral Once   0.1 mg at 08/08/12 2129   . cloNIDine (CATAPRES-TTS-1) 0.1 MG/24HR 1 patch  1 patch Transdermal Weekly   1 patch at 08/07/12 1740   . dextrose (GLUCOSE) 40 % oral gel 15 g  15 g Oral PRN       . dextrose 50 % bolus 25 mL  25 mL Intravenous PRN       . diphenhydrAMINE (BENADRYL) injection 12.5 mg  12.5 mg Intravenous Q6H PRN       . glucagon (rDNA) (GLUCAGEN) injection 1 mg  1 mg Intramuscular PRN       . hydrALAZINE (APRESOLINE) injection 10 mg  10 mg Intravenous Q6H PRN   10 mg at 08/08/12 1542   . HYDROmorphone (DILAUDID) 0.2 mg/mL in sodium  chloride 0.9% 100 mL PCA   Intravenous Continuous   20 mg at 08/06/12 1852   . HYDROmorphone (DILAUDID) injection 1 mg  1 mg Intravenous Q2H PRN   1 mg at 08/06/12 1700   . HYDROmorphone PCA 0.2 mg/mL BOLUS from PCA  0.5 mg Intravenous Q5 Min PRN       . insulin aspart (NovoLOG) injection 1-5 Units  1-5 Units Subcutaneous TID AC   3 Units at 08/09/12 1232   . insulin glargine (LANTUS) injection 10 Units  10 Units Subcutaneous QHS   10 Units at 08/08/12 2130   . ipratropium-albuterol (DUO-NEB) 0.5-2.5(3) mg/3 mL nebulizer 3 mL  3 mL Nebulization Q6H PRN       . ketorolac (TORADOL) injection 15 mg  15 mg Intravenous Q6H PRN   15 mg at 08/05/12 1013   . levofloxacin (LEVAQUIN) 750mg  in D5W IVPB (premix)  750 mg Intravenous Daily   750 mg at 08/08/12 2129   . metoclopramide (REGLAN) injection 10 mg  10 mg Intravenous Q6H SCH   10 mg at 08/09/12 1233   . metoprolol (LOPRESSOR) tablet 50 mg  50 mg Oral Q12H SCH       . metroNIDAZOLE (FLAGYL) 500mg  in NS IVPB (premix)  500 mg Intravenous Q8H 100 mL/hr at 08/09/12 1053 500 mg at 08/09/12 1053   . naloxone (NARCAN) injection 0.1 mg  0.1 mg Intravenous PRN       . ondansetron (ZOFRAN) injection 4 mg  4 mg Intravenous Q4H PRN   4 mg at 08/08/12 1920   . oxyCODONE-acetaminophen (PERCOCET) 5-325 MG per tablet 1 tablet  1 tablet Oral Q4H PRN       . pantoprazole (PROTONIX) EC tablet 40 mg  40 mg Oral BID       . promethazine (PHENERGAN) injection 6.25 mg  6.25 mg Intravenous Q6H PRN   6.25 mg at 08/08/12 1247   . traMADol (ULTRAM) tablet 50 mg  50 mg Oral Q6H PRN       . [DISCONTINUED] metoprolol (LOPRESSOR) injection 5 mg  5 mg Intravenous Q6H   5 mg at 08/09/12 1233   . [DISCONTINUED] pantoprazole (PROTONIX) injection 40 mg  40 mg Intravenous BID   40 mg at 08/09/12  1050     Facility-Administered Medications Ordered in Other Encounters   Medication Dose Route Frequency Last Rate Last Dose   . [DISCONTINUED] fentaNYL (SUBLIMAZE) injection    PRN   50 mcg at 08/08/12  1705   . [DISCONTINUED] lidocaine (XYLOCAINE) 2 % injection    PRN   150 mg at 08/08/12 1701   . [DISCONTINUED] propofol (DIPRIVAN) 10 mg/mL infusion    Continuous PRN   300 mcg/kg/min at 08/08/12 1701   . [DISCONTINUED] propofol (DIPRIVAN) injection    PRN   50 mg at 08/08/12 1705     Review of Systems:   A comprehensive review of systems has no changes since H&P was obtained except as mentioned in the subjective section.    Labs:     Results     Procedure Component Value Units Date/Time    POCT Glucose [[id number]]  (Abnormal) Collected:08/09/12 1132     POCT Glucose WB 260 (A) mg/dL [id number]:24/40/10 2725    CULTURE BLOOD AEROBIC AND ANAEROBIC [[id number]] Collected:08/05/12 0753    Specimen Information:Blood / Blood, Venipuncture Updated:08/09/12 1015    Narrative:    X2, from different sites, if not done in ER  ORDER#: [id number]                                    ORDERED BY: HARTOJO, WIBISO  SOURCE: Blood, Venipuncture Arm                      COLLECTED:  08/05/12 07:53  ANTIBIOTICS AT COLL.:                                RECEIVED :  08/05/12 09:55  Culture Blood Aerobic and Anaerobic        PRELIM      08/09/12 10:15  08/06/12   No Growth after 1 day/s of incubation.  08/07/12   No Growth after 2 day/s of incubation.  08/08/12   No Growth after 3 day/s of incubation.  08/09/12   No Growth after 4 day/s of incubation.      CULTURE BLOOD AEROBIC AND ANAEROBIC [[id number]] Collected:08/05/12 0754    Specimen Information:Blood / Blood, Venipuncture Updated:08/09/12 1015    Narrative:    X2, from different sites, if not done in ER  ORDER#: [id number]                                    ORDERED BY: HARTOJO, WIBISO  SOURCE: Blood, Venipuncture Arm                      COLLECTED:  08/05/12 07:54  ANTIBIOTICS AT COLL.:                                RECEIVED :  08/05/12 09:55  Culture Blood Aerobic and Anaerobic        PRELIM      08/09/12 10:15  08/06/12   No Growth after 1 day/s of incubation.  08/07/12   No Growth after  2 day/s of incubation.  08/08/12   No Growth after 3 day/s of incubation.  08/09/12   No Growth after 4 day/s of incubation.  POCT Glucose [[id number]]  (Abnormal) Collected:08/09/12 0750     POCT Glucose WB 198 (A) mg/dL [id number]:14/78/29 5621    Basic Metabolic Panel [[id number]]  (Abnormal) Collected:08/09/12 0641    Specimen Information:Blood Updated:08/09/12 0743     Glucose 214 (H) mg/dL      BUN 96.2 mg/dL      Creatinine 1.4 (H) mg/dL      CALCIUM 9.5 mg/dL      Sodium 952      Potassium 3.9      Chloride 103      CO2 28      Anion Gap 11.0     GFR [[id number]] Collected:08/09/12 0641     EGFR >60.0 Updated:08/09/12 0743    CBC without differential [[id number]]  (Abnormal) Collected:08/09/12 0641    Specimen Information:Blood / Blood Updated:08/09/12 0728     WBC 7.52      RBC 5.21      Hgb 13.9 g/dL      Hematocrit 02.7 %      MCV 80.6 fL      MCH 26.7 (L) pg      MCHC 33.1 g/dL      RDW 13 %      Platelets 367      MPV 8.9 (L) fL     POCT Glucose [[id number]]  (Abnormal) Collected:08/08/12 2151     POCT Glucose WB 213 (A) mg/dL OZDGUYQ:03/47/42 5956    POCT Glucose [[id number]]  (Abnormal) Collected:08/08/12 1621    Specimen Information:Blood Updated:08/08/12 1621     POCT Glucose WB 209 (A) mg/dL           Rads:   Radiological Procedure reviewed.  Radiology Results (24 Hour)     ** No Results found for the last 24 hours. **          Multi-Disciplinary Care Input:   Case management notes: No Patient Care Coordination Note on file.     PT/OT/ST notes:     Time spent for evaluation, management and coordination of care:   30 minutes          Signed by: Anselmo Pickler  08/09/2012 3:12 PM

## 2012-08-09 NOTE — Progress Notes (Signed)
Pt is resting in bd, No pain, DR.Crenshaw visited patient and said ok to d/c patient from GI perpctive,  inform Dr. Dwaine Gale via pager,  Pr Dr. Dwaine Gale will d/c tomorrow.

## 2012-08-09 NOTE — SLP Eval Note (Signed)
[facility]  [address]  [place], [place]. [address]    Department of Rehabilitation Services  [phone]    Speech and Language Therapy Bedside Swallow Evaluation    Patient: [name]    MRN#: [id number]     Time of Treatment: SLP Received On: 08/09/12  Start Time: 1000  Stop Time: 1050  Time Calculation (min): 50 min    SLP Visit Number: 1    Consult received for [name] for SLP Bedside Swallow Evaluation and Treatment.    Medical Diagnosis: Pneumonia [486]  Intractable vomiting [536.2]  [id number] PNA (pneumonia)[id number]  [phone]    History of Present Illness: [name] is a 37 y.o. male admitted on 08/04/2012 with abdominal pain.  Pt reports that he had persistent nausea and vomiting for 2 days.  Went to [facility] and was released after hydration and told to follow with his PCP for gastroparesis.  Pt stated he did fine through the night, but when he awoke on day of admission, he stated the nausea was there and he started vomiting again.  He went to see his PCP who referred him to the ER for the vomiting.  In the ER, pt had an x-ray showing pneumonia, in which he was admitted.        Patient Active Problem List   Diagnosis   . DKA (diabetic ketoacidoses)   . GERD (gastroesophageal reflux disease)   . Gastroparesis   . Dehydration, moderate   . Gastritis without bleeding   . AKI (acute kidney injury)   . Type 2 diabetes, uncontrolled, with neuropathy   . Hypertension   . Nausea & vomiting   . Pneumonia   . Intractable vomiting   . Abdominal pain   . DM (diabetes mellitus)   . HTN (hypertension)   . UGI bleed        Past Medical/Surgical History:  Past Medical History   Diagnosis Date   . Diabetes    . Hypertensive disorder    . Gastroparesis       Past Surgical History   Procedure Date   . Cholecystectomy          History/Current Status:  History/Current Status  Respiratory Status: room air  Behavior/Mental Status: Alert;Cooperative  Nutrition: oral    Subjective:Patient's  medical condition is appropriate for Speech therapy intervention at this time. Patient pleasant and cooperative. Patient sitting on side of the bed.    Objective:  Oral Motor Skills  Oral Motor Skills: within functional limits    Deglutition Skills  Position: upright 90 degrees  Food(s) Tested: thin liquid;puree;solid  Oral Stage: adequate  Pharyngeal Stage: adequate  Esophageal Stage: appears within functional limits    Assessment:   Patient was administered trials of Solid and thin liquids. Active mastication was noted with the solids and no clinical s/s of aspiration noted after sips of thin liquids from the cup.    Goals:  N/A    Plan/Recommendations:  no SLP f/u necessary        Diet Solids Recommendation: regular     Precautions/Compensations: Awake/alert;Upright 90 degrees for all oral intake;45 degrees upright after meals  Recommendation Discussed With: : Patient;Nurse  Recommended Form of Meds: Whole;With liquid  Administration of Medications: Whole with liquid  Aspiration Precautions posted at bedside: No, N/A    Ravan Schlemmer L Shandria Clinch, MS,CCC/SLP

## 2012-08-09 NOTE — Progress Notes (Signed)
Pain greatly relieved today. Will continue to follow overnight to confirm good control and consider d/c from service in AM.

## 2012-08-09 NOTE — Progress Notes (Signed)
Earlier report received, care resumed, pt is sleeping, IVF infusing without any difficulty. Call bell within reach.

## 2012-08-09 NOTE — Progress Notes (Signed)
D/c PCa per dr. Glenis Smoker order and educated patient about PRN pain mdication.

## 2012-08-10 LAB — CBC
Hematocrit: 40.1 % — ABNORMAL LOW (ref 42.0–52.0)
Hgb: 13.3 g/dL (ref 13.0–17.0)
MCH: 26.7 pg — ABNORMAL LOW (ref 28.0–32.0)
MCHC: 33.2 g/dL (ref 32.0–36.0)
MCV: 80.5 fL (ref 80.0–100.0)
MPV: 9 fL — ABNORMAL LOW (ref 9.4–12.3)
Platelets: 309 (ref 140–400)
RBC: 4.98 (ref 4.70–6.00)
RDW: 13 % (ref 12–15)
WBC: 6.51 (ref 3.50–10.80)

## 2012-08-10 LAB — POCT GLUCOSE: Whole Blood Glucose POCT: 234 mg/dL — AB (ref 70–100)

## 2012-08-10 LAB — BASIC METABOLIC PANEL
Anion Gap: 9 (ref 5.0–15.0)
BUN: 14.1 mg/dL (ref 9.0–21.0)
CO2: 29 (ref 22–29)
Calcium: 9 mg/dL (ref 8.5–10.5)
Chloride: 103 (ref 98–107)
Creatinine: 1.2 mg/dL (ref 0.7–1.3)
Glucose: 235 mg/dL — ABNORMAL HIGH (ref 70–100)
Potassium: 3.5 (ref 3.5–5.1)
Sodium: 141 (ref 136–145)

## 2012-08-10 LAB — GFR: EGFR: >60

## 2012-08-10 MED ORDER — TRAMADOL HCL 50 MG PO TABS
50.0000 mg | ORAL_TABLET | Freq: Four times a day (QID) | ORAL | Status: DC | PRN
Start: 2012-08-10 — End: 2012-10-05

## 2012-08-10 MED ORDER — AMLODIPINE BESYLATE 5 MG PO TABS
5.00 mg | ORAL_TABLET | Freq: Every day | ORAL | Status: DC
Start: 2012-08-10 — End: 2012-10-10

## 2012-08-10 MED ORDER — SUCRALFATE 1 G PO TABS
1.0000 g | ORAL_TABLET | Freq: Three times a day (TID) | ORAL | Status: DC
Start: 2012-08-10 — End: 2012-10-07

## 2012-08-10 MED ORDER — CLONIDINE HCL 0.1 MG PO TABS
0.1000 mg | ORAL_TABLET | Freq: Two times a day (BID) | ORAL | Status: DC
Start: 2012-08-10 — End: 2012-10-26

## 2012-08-10 MED ORDER — ONDANSETRON 4 MG PO TBDP
4.0000 mg | ORAL_TABLET | Freq: Three times a day (TID) | ORAL | Status: DC | PRN
Start: 2012-08-10 — End: 2012-08-12

## 2012-08-10 MED ORDER — PANTOPRAZOLE SODIUM 40 MG PO TBEC
40.00 mg | DELAYED_RELEASE_TABLET | Freq: Two times a day (BID) | ORAL | Status: DC
Start: 2012-08-10 — End: 2012-10-07

## 2012-08-10 MED ORDER — OXYCODONE HCL 5 MG PO TABS
5.00 mg | ORAL_TABLET | Freq: Three times a day (TID) | ORAL | Status: DC | PRN
Start: 2012-08-10 — End: 2012-10-05

## 2012-08-10 MED ORDER — CLONIDINE HCL 0.1 MG PO TABS
0.1000 mg | ORAL_TABLET | Freq: Two times a day (BID) | ORAL | Status: DC
Start: 2012-08-10 — End: 2012-08-10

## 2012-08-10 NOTE — Discharge Summary (Signed)
[facility] Hospitalist Discharge Note      Date Time: 08/10/2012  10:04 AM  Patient Name:[name]  ZOX:[id number]  PCP: [name], MD  Attending Physician:Jilliane Kazanjian Glenis Smoker M.D.    Hospital Course:   Please see H&P for complete details of HPI and ROS. The patient was admitted to [facility] and has been diagnosed with the following conditions and has been taken care as mentioned below.    Patient Active Problem List    Diagnosis Date Noted   . UGI bleed 08/07/2012   . Pneumonia 08/05/2012   . Intractable vomiting 08/05/2012   . Abdominal pain 08/05/2012   . DM (diabetes mellitus) 08/05/2012   . HTN (hypertension) 08/05/2012   . Type 2 diabetes, uncontrolled, with neuropathy 06/27/2012   . Hypertension 06/27/2012   . Nausea & vomiting 06/27/2012   . DKA (diabetic ketoacidoses) 06/17/2012   . GERD (gastroesophageal reflux disease) 06/17/2012   . Gastroparesis 06/17/2012   . Dehydration, moderate 06/17/2012   . Gastritis without bleeding 06/17/2012   . AKI (acute kidney injury) 06/17/2012   The patient has a history of diabetes with noncompliance with outpatient regimen, also gastroparesis. Has been admitted on multiple occasions to hospitals in [place] for hyperglycemia including DKA that has   been associated with abdominal pain, nausea, and vomiting. This includes a recent hospitalization on Jun 17, 2012, for recurrent DKA to [facility]. He was seen during that encounter and improved clinically.   Several days after discharge, he was admitted to the [facility] ([facility] [place] [facility]) for similar signs and symptoms.   The patient recently experienced an exacerbation of his abdominal pain in the epigastric area, nausea, and vomiting. They saw small specks of blood. He has been experiencing variable temperatures at home.   INTRACTABLE abd pain with intractable nausea and vomiting :severe gastroparesis associated with his diabetes:   CT abd unremarkable ;S/p EGD :gastritis   Seen by  GI input  ; ppi bid ; started reglan :significantly improved  [place] on ppi bid  and erythromycin. To hold  Reglan; outpt f/u with GI  1week  Off PCA x 24 hrs  Hematemesis: likely due to either gastritis or esophagitis. Hb stable   Aspiration pneumonia :On levaquin and flagyl ; ID has recommended no abx at  ; outpt f/u 10 days  Fever :resolved ; most likey sec to above; cultures negative   Type 2 diabetes, uncontrolled, with neuropathy :resume home regimen with outpt f/u  GERD (gastroesophageal reflux disease) :ppi   HTN (hypertension):uncontrolled ; improved on clonidine ; resume norvasc     Type of Admission:IP  Medical Necessity for stay:severe diabetic gastroparesis; intractable NV ; abd pain requiring PCA; uncontrolled htn ; aspiration pneumonia    Date of Admission:   08/04/2012    Date of Discharge:   08/10/2012    Chief Complaint:      Chief Complaint   Patient presents with   . Abdominal Pain       Discharge Diagnosis:   Hospital Problems:  Principal Problem:   *Pneumonia  Active Problems:   GERD (gastroesophageal reflux disease)   Gastroparesis   Intractable vomiting   Abdominal pain   DM (diabetes mellitus)   HTN (hypertension)   UGI bleed      Lists the present on admission hospital problems  Present on Admission:   . (Resolved) PNA (pneumonia)  . Pneumonia  . Intractable vomiting  . Abdominal pain  . Gastroparesis  .  GERD (gastroesophageal reflux disease)  . DM (diabetes mellitus)  . HTN (hypertension)        Consult Input/Plan     ID  GI    Physical Exam:    height is 1.93 m (6\' 4" ) and weight is 126.7 kg (279 lb 5.2 oz). His oral temperature is 99 F (37.2 C). His blood pressure is 141/83 and his pulse is 79. His respiration is 18 and oxygen saturation is 98%.   Body mass index is 34.01 kg/(m^2).  Filed Vitals:    08/09/12 2012 08/09/12 2305 08/10/12 0149 08/10/12 0527   BP: 150/78 155/83 152/82 141/83   Pulse: 81 85 79 79   Temp:  99 F (37.2 C) 98 F (36.7 C) 99 F (37.2 C)   TempSrc:  Temporal Artery  Temporal Artery Oral   Resp:  18 18 18    Height:       Weight:       SpO2: 98% 95% 97% 98%     Intake and Output Summary (Last 24 hours) at Date Time    Intake/Output Summary (Last 24 hours) at 08/10/12 1004  Last data filed at 08/10/12 0600   Gross per 24 hour   Intake   2856 ml   Output      0 ml   Net   2856 ml         Labs:     Results     Procedure Component Value Units Date/Time    POCT Glucose [[id number]]  (Abnormal) Collected:08/10/12 0735     POCT Glucose WB 234 (A) mg/dL NWGNFAO:13/08/65 7846    Basic Metabolic Panel [[id number]]  (Abnormal) Collected:08/10/12 0610    Specimen Information:Blood Updated:08/10/12 0710     Glucose 235 (H) mg/dL      BUN 32.4 mg/dL      Creatinine 1.2 mg/dL      CALCIUM 9.0 mg/dL      Sodium 401      Potassium 3.5      Chloride 103      CO2 29      Anion Gap 9.0     GFR [[id number]] Collected:08/10/12 0610     EGFR >60.0 Updated:08/10/12 0710    CBC without differential [[id number]]  (Abnormal) Collected:08/10/12 0610    Specimen Information:Blood / Blood Updated:08/10/12 0655     WBC 6.51      RBC 4.98      Hgb 13.3 g/dL      Hematocrit 40.3 (L) %      MCV 80.5 fL      MCH 26.7 (L) pg      MCHC 33.2 g/dL      RDW 13 %      Platelets 309      MPV 9.0 (L) fL     POCT Glucose [[id number]]  (Abnormal) Collected:08/09/12 2200     POCT Glucose WB 234 (A) mg/dL KVQQVZD:63/87/56 4332    POCT Glucose [[id number]]  (Abnormal) Collected:08/09/12 1644     POCT Glucose WB 255 (A) mg/dL AYTKZSW:10/93/23 5573    POCT Glucose [[id number]]  (Abnormal) Collected:08/09/12 1132     POCT Glucose WB 260 (A) mg/dL WCBJSEG:31/51/76 1607    CULTURE BLOOD AEROBIC AND ANAEROBIC [[id number]] Collected:08/05/12 0753    Specimen Information:Blood / Blood, Venipuncture Updated:08/09/12 1015    Narrative:    X2, from different sites, if not done in ER  ORDER#: [id number]  ORDERED BY: HARTOJO, WIBISO  SOURCE: Blood, Venipuncture Arm                      COLLECTED:  08/05/12  07:53  ANTIBIOTICS AT COLL.:                                RECEIVED :  08/05/12 09:55  Culture Blood Aerobic and Anaerobic        PRELIM      08/09/12 10:15  08/06/12   No Growth after 1 day/s of incubation.  08/07/12   No Growth after 2 day/s of incubation.  08/08/12   No Growth after 3 day/s of incubation.  08/09/12   No Growth after 4 day/s of incubation.      CULTURE BLOOD AEROBIC AND ANAEROBIC [[id number]] Collected:08/05/12 0754    Specimen Information:Blood / Blood, Venipuncture Updated:08/09/12 1015    Narrative:    X2, from different sites, if not done in ER  ORDER#: [id number]                                    ORDERED BY: HARTOJO, WIBISO  SOURCE: Blood, Venipuncture Arm                      COLLECTED:  08/05/12 07:54  ANTIBIOTICS AT COLL.:                                RECEIVED :  08/05/12 09:55  Culture Blood Aerobic and Anaerobic        PRELIM      08/09/12 10:15  08/06/12   No Growth after 1 day/s of incubation.  08/07/12   No Growth after 2 day/s of incubation.  08/08/12   No Growth after 3 day/s of incubation.  08/09/12   No Growth after 4 day/s of incubation.              Rads:   Radiological Procedure reviewed.  Xr Chest 2 Views    08/05/2012  CLINICAL HISTORY: Follow-up pneumonia  COMPARISON: 01/10/2011  FINDINGS:Frontal and lateral views of the chest:  The lungs appear clear. There is no pneumothorax or effusion. Cardiomediastinal contours are within normal limits. Surgical clips in right upper quadrant       08/05/2012   1.  The lungs appear clear  Emeline Darling, MD  08/05/2012 10:28 PM     Ct Abdomen Pelvis W Iv And Po Cont    08/06/2012  Clinical indications: Nausea vomiting fever. Abdominal pain. Comparison 06/22/2012.  TECHNIQUE: CT of the abdomen and pelvis with IV contrast. No intravenous contrast was given.  FINDINGS: Exam slightly limited by respiratory motion. The lung bases are clear. Small hiatal hernia is present.  Hypodensities at the hepatic dome, not significantly changed, likely  cysts or hemangiomas. Patient status post cholecystectomy. No biliary dilatation. The portal vein is patent. The spleen, adrenal glands pancreas and kidneys are normal.  No bowel obstruction. Normal appendix is visualized. No bowel wall thickening.  Bladder partially distended. Prostate not enlarged. Aorta is normal in caliber. No adenopathy or ascites. Visualized osseous structures are unremarkable.      08/06/2012   1. No localizing inflammatory changes to correlate with the patient's pain. 2. Other findings as above.  Jasmine December  D'Heureux, MD  08/06/2012 4:20 PM     Abdomen 2 View With Chest 1 View    08/04/2012  History: Epigastric pain.  COMPARISON: CT 06/22/2012 and acute abdominal series 06/21/2012.  Upright PA chest and supine and upright abdominal radiographs are reviewed.  Right upper lobe infiltrate is seen, new since the prior study. No effusion is seen. Heart is normal size. Pulmonary vascularity is unremarkable. There is no pneumothorax.  Abdominal radiographs demonstrate evidence of prior cholecystectomy. There is a moderate volume of stool seen throughout the colon. No evidence for small bowel dilatation is seen. There are no worrisome air-fluid levels. No free air is seen beneath the hemidiaphragms.      08/04/2012   Right upper lobe pneumonia. Unremarkable abdomen abdomen.  Geanie Cooley, MD  08/04/2012 8:39 PM       Discharge Medications:     Please See Discharge Medication reconciliation for the final list of medications.    Pending Labs:   With pcp 1 week  Discharge Destination:   home  Condition at Discharge :   Improved    Time spent for Discharge Care:   35 minutes    Follow-up:   [name], MD In 1 week [address]  [address] [place] [address]  [phone]  Lambert Mody, MD In 10 days [address] [facility]  [address]  [place] [address]  [phone]      Recommended Follow up with PCP in one week.    Signed by: Anselmo Pickler, MD

## 2012-08-10 NOTE — Discharge Instructions (Signed)
Discharge Instructions        Follow with Primary MD [name], MD in 7 days     Get CBC, CMP, checked in 7 days by Primary MD and again as instructed by your Primary MD. Get Medicines reviewed and adjusted.    Please request your Prim.MD to go over all Hospital Tests and Procedure/Radiological results at the follow up, please get all Hospital records sent to your Prim MD by signing hospital release before you go home.    Follow with Dr   [name], MD In 1 week [address]  [address] [place] [address]  [phone]  Lambert Mody, MD In 10 days [address] [facility]  [address]  [place] [address]  [phone]      Activity: Fall precautions use walker/cane & assistance as needed      Do not drive when taking Pain medications.     Wear Seat belts while driving.    Diet: cardiac; diabetic  Fluid restriction 1.8 lit/day, Aspiration precautions.      Disposition     If you experience worsening of your admission symptoms, develop shortness of breath, life threatening emergency, suicidal or homicidal thoughts you must seek medical attention immediately by calling 911 or calling your MD immediately  if symptoms less severe.    Do not take more than prescribed Pain, Sleep and Anxiety Medications    Special Instructions: If you have smoked or chewed Tobacco  in the last 2 yrs please stop smoking, stop any regular Alcohol  and or any Recreational drug use.    You Must read complete instructions/literature along with all the possible adverse reactions/side effects for all the Medicines you take and that have been prescribed to you. Take any new Medicines after you have completely understood and accepted all the possible adverse reactions/side effects.      [email or web address] Program    Education about New Medications and their Side effects    Dear [name],    Its been a pleasure taking care of you during your hospitalization here at [facility]. We have initiated a new program to educate our  patients and/or their family members or designated personnel about the new medications started by your physicians and their indications along with the possible side effects. Multiple studies have shown that patients started on new medications are often unaware of the names of the medication along with the indications and their side effects which leads to decreased compliance with the medications.    During our conversation today on 08/10/2012  10:02 AM I have explained to you the name of the new medication and the indication along with some possible common side effects. Listed below are some of the new medications started during this hospitalization.     Please call the Nurse if you have any side effects while in hospital.     Please call 911 if you have any life threatening symptoms after you are discharged from the hospital.    Please inform your Primary care physician for common side effects which are not life threatening after discharge.  Medication: Clonidine(Catapress)   This Medication is used for:   High Blood Pressure  Common Side Effects are:   Drowsiness   Dry mouth   Dizziness   Skin irritations (with patch)  A note from your nurse:  Call your nurse immediately if you notice itching, hives, swelling or trouble breathing        Tramadol(Ultram) Pain  Drowsiness, flushing, nausea, constipation      Medication:Oxycodone/APAP(Percocet)   This Medication is used for:   Moderate to Severe pain    Common Side Effects are:   Drowsiness   Dizziness   Constipation   Itching    A note from your nurse:  Call your nurse immediately if you notice itching, hives, swelling or trouble breathing        Thank you for your time.    Anselmo Pickler, MD  08/10/2012  10:02 AM  [facility]  [address]  [place], [place]  [address]

## 2012-08-10 NOTE — Consults (Signed)
Service Date: 08/05/2012     Patient Type: I     CONSULTING PHYSICIAN: Lambert Mody MD     REFERRING PHYSICIAN: Anselmo Pickler MD     REASON FOR CONSULTATION:  Possible pneumonia.     HISTORY OF PRESENT ILLNESS:  This is a 37 year old gentleman admitted early a.m. on the 24th when he  presented with complaints of abdominal pain.  The patient states that he  had nausea and vomiting persisting for 2 days into [facility]  and was released after hydration and follow up with PCP for gastroparesis.   The patient stated that he did okay overnight, but when we woke up on the  day of admission, he had nausea and he started vomiting again.  He went to  see his primary care physician who referred him to the emergency department  for intractable nausea and vomiting.  In the emergency department, he had  an x-ray showing pneumonia and was admitted.  He was started on Levaquin  and Zosyn.  He did not have a leukocytosis and was afebrile.  He continues  to have nausea, vomiting, and complaining of upper abdominal pain.  Denies  .  Denies any headaches, changes in his vision, runny nose, sore  throat, cough, chest pain, shortness of breath, any ill contacts.  He  denies any dysuria, urinary urgency, frequency, or flank pain, any  arthralgias, myalgias, rashes, any focal weakness.  He has malaise and  fatigue.     PAST MEDICAL HISTORY:  Significant for  1.  Diabetes.    2.  Gastroparesis.  3.  Hypertension.     PAST SURGICAL HISTORY:  Cholecystectomy.     FAMILY HISTORY:  Significant for diabetes.     SOCIAL HISTORY:  Single.  Denies tobacco or recreational drug use.  Drinks alcohol socially.     ALLERGIES:  No known drug allergies.     MEDICATIONS:  He is on IV fluids, insulin, Levaquin 750 mg IV every 24 hours, Zosyn 4.5  grams IV q.8 h., and Protonix as well as Reglan.     PHYSICAL EXAMINATION:  VITAL SIGNS:  He has a T-max of 98.8, T-current 97.9, heart rate 94,  respirations 18, saturating 96% on room air, blood  pressure 146/82.  GENERAL:  Well-developed, middle-aged gentleman lying in bed, lethargic  from pain medicines, but answering questions and following simple commands.  HEENT:  Normocephalic, atraumatic.  EOMI.  PERRLA.  Sclerae anicteric.   Conjunctivae pink.  Mucous membranes moist.  Oropharynx clear.  NECK:  Supple.  No adenopathy, no paranasal sinus area tenderness.  CHEST:  CTA bilaterally.  CARDIOVASCULAR:  S1, S2, regular rate and rhythm, no murmurs, rubs, or  gallops.  ABDOMEN:  Soft with epigastric tenderness as well as minimal left upper  quadrant tenderness without with slightly hyperactive bowel sounds.  No  spinal or CVA tenderness.  EXTREMITIES:  No clubbing, cyanosis, or edema.  Joints benign.  NEUROLOGIC:  Grossly nonfocal.     LABORATORY DATA:  He had a UA which had protein, glucose, ketones, and 2 to 5 RBCs, 6 to 10  WBCs, 0 to 5 squamous epithelial cells, glucose was 225, BUN and creatinine  10.4 and 1.3, sodium 141, potassium 3.9, chloride 101, CO2 of 25, calcium  9.4, albumin 3.8.  LFTs within normal limits.  Lipase 29.  White count was  8.78.  Last night  H and H 13.8 and 41.6 with 302 platelets and 79%  granulocytes.  Abdominal  2-view with 1 chest view showed right upper lobe  pneumonia, unremarkable abdomen.  CBC today shows a white count of 6.65, H  and H of 12.3 and 37.5 with 286 platelets, 61% granulocytes, 27%  lymphocytes.  His glucose is 176.  BUN and creatinine are 10.6 and 1.1.   Sodium 143, potassium 3.8, chloride 106, CO2 of 27, albumin 3.1.  LFTs  within normal limits.   Magnesium is 2.0.  Blood cultures are pending.     IMPRESSION:  1.  Possible aspiration pneumonitis in the setting of intractable nausea  and vomiting.  The patient is afebrile without leukocytosis and does not  have a cough or shortness of breath at this time.  2.  Intractable nausea and vomiting with dehydration, likely due to  gastroparesis  2.  Abdominal pain.  3.  Gastroesophageal reflux disease.  4.   Diabetes.  5.  Hypertension.     RECOMMENDATIONS:  1.  We will discontinue Zosyn.  2.  Check a chest x-ray, PA and lateral.  3.  Continue Levaquin pending repeat chest x-ray the chest.  X-ray shows  persistent infiltrate without Flagyl.  This will cover aspiration pneumonia  nicely as well as community-acquired pneumonia, and we can check urinary  Legionella antigen, mycoplasma and chlamydia serologies.  4.  Check saturation on room air.  5.  Keep head of bed elevated.  Review of gastroparesis and high risk for  aspiration.  6.  We will tailor antibiotics according to the patient's clinical course  and culture results.     Thank you for allowing me to participate in the care of this patient.  We  will follow with you.     Discussed with Dr. Dwaine Gale.           D:  08/10/2012 11:26 AM by Dr. Lambert Mody, MD ([id number])  T:  08/10/2012 18:56 PM by [id number]      Everlean Cherry: [id number]) (Doc ID: [id number])

## 2012-08-10 NOTE — Progress Notes (Signed)
Assessment completed pt doing well, no c/o voiced , possible d/c today.

## 2012-08-10 NOTE — Progress Notes (Signed)
INFECTIOUS DISEASES PROGRESS NOTE  Lambert Mody        Date Time: 08/10/2012 10:32 AM  Patient Name: [name]    Patient Active Problem List    Diagnosis Date Noted   . UGI bleed 08/07/2012   . Pneumonia 08/05/2012   . Intractable vomiting 08/05/2012   . Abdominal pain 08/05/2012   . DM (diabetes mellitus) 08/05/2012   . HTN (hypertension) 08/05/2012   . Type 2 diabetes, uncontrolled, with neuropathy 06/27/2012   . Hypertension 06/27/2012   . Nausea & vomiting 06/27/2012   . DKA (diabetic ketoacidoses) 06/17/2012   . GERD (gastroesophageal reflux disease) 06/17/2012   . Gastroparesis 06/17/2012   . Dehydration, moderate 06/17/2012   . Gastritis without bleeding 06/17/2012   . AKI (acute kidney injury) 06/17/2012           Assessment:     Intractable vomiting   Abdominal pain- CT abd unrevealing , S/P EGD which showed gastritis. Bx taken, path pending.  Gastroparesis on erythromycin  Fever- w/o leucocytosis.Afebrile .CT abd unrevealing , CXR neg for pneumonia and no resp SX but he is at risk for aspiration given gastroparesis and N/V and being drowsy from pain meds. On Levaquin/flagyl .   Type 2 diabetes, uncontrolled, with neuropathy  GERD (gastroesophageal reflux disease)   HTN (hypertension)   Aspiration risk    Plan:   OK to discharge from ID point of view without Abx   F/U with me in 7-101 days  D/W Dr Dwaine Gale    Subjective:   [name] today has, No fevers, No chills, No nightsweats,+malaise   No headache, No chest pain,   No upper abdominal pain, NoNausea/ vomiting,2 loose stools   No dysuria, No urgency, No frequency,    No Cough - Reports no  DOE     Appetite improving   No new weakness tingling or numbness,   Feeling " better"    Antibiotics:   Levaquin #6  Flagyl #5  All other medications reviewed    Lines:     Active PICC Line / CVC Line / PIV Line / Drain / Airway / Intraosseous Line / Epidural Line / ART Line / Line / Wound / Pressure Ulcer / NG/OG Tube     Name   Placement date   Placement  time   Site   Days    Peripheral IV 08/04/12 Right Forearm  08/04/12   1942   Forearm   1          Physical Exam:   Temp:  [97.2 F (36.2 C)-99 F (37.2 C)] 97.2 F (36.2 C)  Heart Rate:  [79-103] 86   Resp Rate:  [18] 18   BP: (141-190)/(78-91) 151/86 mmHg        GEN: sleepy but arousable, Oriented x3, No new F.N deficits, Normal affect  HEENT: NC.AT,PERRLA, EOMI. MMM  Neck:Supple Neck,No JVD, No cervical lymphadenopathy appreciated.   Chest: Symmetrical Chest wall movement, Good air movement bilaterally, CTAB  CVS: RRR,No Gallops,Rubs or new Murmurs, No Parasternal Heave  Abd:  +ve B.Sounds, Abd Soft, no TTP  upper abd , No organomegaly appriciated, No rebound -guarding or rigidity.  Ext:  No Cyanosis, Clubbing or edema, No new Rash or bruise  Neuro: Grossly non focal          Labs:     Results     Procedure Component Value Units Date/Time    CULTURE BLOOD AEROBIC AND ANAEROBIC [[id number]] Collected:08/05/12 0753  Specimen Information:Blood / Blood, Venipuncture Updated:08/10/12 1015    Narrative:    X2, from different sites, if not done in ER  ORDER#: [id number]                                    ORDERED BY: HARTOJO, WIBISO  SOURCE: Blood, Venipuncture Arm                      COLLECTED:  08/05/12 07:53  ANTIBIOTICS AT COLL.:                                RECEIVED :  08/05/12 09:55  Culture Blood Aerobic and Anaerobic        PRELIM      08/10/12 10:15  08/06/12   No Growth after 1 day/s of incubation.  08/07/12   No Growth after 2 day/s of incubation.  08/08/12   No Growth after 3 day/s of incubation.  08/09/12   No Growth after 4 day/s of incubation.  08/10/12   No Growth after 5 day/s of incubation.      CULTURE BLOOD AEROBIC AND ANAEROBIC [[id number]] Collected:08/05/12 0754    Specimen Information:Blood / Blood, Venipuncture Updated:08/10/12 1015    Narrative:    X2, from different sites, if not done in ER  ORDER#: [id number]                                    ORDERED BY: HARTOJO, WIBISO  SOURCE: Blood,  Venipuncture Arm                      COLLECTED:  08/05/12 07:54  ANTIBIOTICS AT COLL.:                                RECEIVED :  08/05/12 09:55  Culture Blood Aerobic and Anaerobic        PRELIM      08/10/12 10:15  08/06/12   No Growth after 1 day/s of incubation.  08/07/12   No Growth after 2 day/s of incubation.  08/08/12   No Growth after 3 day/s of incubation.  08/09/12   No Growth after 4 day/s of incubation.  08/10/12   No Growth after 5 day/s of incubation.      POCT Glucose [[id number]]  (Abnormal) Collected:08/10/12 0735     POCT Glucose WB 234 (A) mg/dL [id number]:13/24/40 1027    Basic Metabolic Panel [[id number]]  (Abnormal) Collected:08/10/12 0610    Specimen Information:Blood Updated:08/10/12 0710     Glucose 235 (H) mg/dL      BUN 47.4 mg/dL      Creatinine 1.2 mg/dL      CALCIUM 9.0 mg/dL      Sodium 259      Potassium 3.5      Chloride 103      CO2 29      Anion Gap 9.0     GFR [[id number]] Collected:08/10/12 0610     EGFR >60.0 Updated:08/10/12 0710    CBC without differential [[id number]]  (Abnormal) Collected:08/10/12 0610    Specimen Information:Blood / Blood Updated:08/10/12 0655     WBC 6.51      RBC 4.98  Hgb 13.3 g/dL      Hematocrit 16.1 ([name]) %      MCV 80.5 fL      MCH 26.7 ([name]) pg      MCHC 33.2 g/dL      RDW 13 %      Platelets 309      MPV 9.0 ([name]) fL     POCT Glucose [[id number]]  (Abnormal) Collected:08/09/12 2200     POCT Glucose WB 234 (A) mg/dL [id number]:82/95/62 1308    POCT Glucose [[id number]]  (Abnormal) Collected:08/09/12 1644     POCT Glucose WB 255 (A) mg/dL XBMWUXL:24/40/10 2725    POCT Glucose [[id number]]  (Abnormal) Collected:08/09/12 1132     POCT Glucose WB 260 (A) mg/dL QQVZDGL:87/56/43 3295          Rads:     Radiology Results (24 Hour)     ** No Results found for the last 24 hours. **            Signed by: Lambert Mody, MD

## 2012-08-10 NOTE — Progress Notes (Signed)
Pt resting in bed, comfortably. No distress noted. No complaints of pain at this time. No n/v noted. Attended with needs. Call bell within reach.

## 2012-08-11 ENCOUNTER — Emergency Department: Payer: BC Managed Care – PPO

## 2012-08-11 ENCOUNTER — Observation Stay: Payer: BC Managed Care – PPO | Admitting: Medical Oncology

## 2012-08-11 ENCOUNTER — Observation Stay
Admission: EM | Admit: 2012-08-11 | Discharge: 2012-08-12 | Disposition: A | Discharge status: Home / Self Care | Payer: BC Managed Care – PPO | Attending: Internal Medicine | Admitting: Internal Medicine

## 2012-08-11 DIAGNOSIS — K219 Gastro-esophageal reflux disease without esophagitis: Secondary | ICD-10-CM | POA: Insufficient documentation

## 2012-08-11 DIAGNOSIS — R1110 Vomiting, unspecified: Secondary | ICD-10-CM

## 2012-08-11 DIAGNOSIS — R1013 Epigastric pain: Secondary | ICD-10-CM | POA: Insufficient documentation

## 2012-08-11 DIAGNOSIS — E119 Type 2 diabetes mellitus without complications: Secondary | ICD-10-CM

## 2012-08-11 DIAGNOSIS — I10 Essential (primary) hypertension: Secondary | ICD-10-CM

## 2012-08-11 DIAGNOSIS — K3184 Gastroparesis: Secondary | ICD-10-CM

## 2012-08-11 DIAGNOSIS — R112 Nausea with vomiting, unspecified: Secondary | ICD-10-CM | POA: Insufficient documentation

## 2012-08-11 DIAGNOSIS — E1142 Type 2 diabetes mellitus with diabetic polyneuropathy: Secondary | ICD-10-CM | POA: Insufficient documentation

## 2012-08-11 DIAGNOSIS — R1115 Cyclical vomiting syndrome unrelated to migraine: Secondary | ICD-10-CM

## 2012-08-11 DIAGNOSIS — R109 Unspecified abdominal pain: Secondary | ICD-10-CM

## 2012-08-11 DIAGNOSIS — E860 Dehydration: Secondary | ICD-10-CM | POA: Insufficient documentation

## 2012-08-11 DIAGNOSIS — E1149 Type 2 diabetes mellitus with other diabetic neurological complication: Principal | ICD-10-CM | POA: Insufficient documentation

## 2012-08-11 DIAGNOSIS — K2970 Gastritis, unspecified, without bleeding: Secondary | ICD-10-CM

## 2012-08-11 DIAGNOSIS — Z794 Long term (current) use of insulin: Secondary | ICD-10-CM | POA: Insufficient documentation

## 2012-08-11 LAB — COMPREHENSIVE METABOLIC PANEL
ALT: 13 U/L (ref 0–55)
AST (SGOT): 15 U/L (ref 5–34)
Albumin/Globulin Ratio: 1.2 (ref 0.9–2.2)
Albumin: 3.9 g/dL (ref 3.5–5.0)
Alkaline Phosphatase: 79 U/L (ref 40–150)
Anion Gap: 14 (ref 5.0–15.0)
BUN: 14 mg/dL (ref 9.0–21.0)
Bilirubin, Total: 0.7 mg/dL (ref 0.2–1.2)
CO2: 25 (ref 22–29)
Calcium: 9.4 mg/dL (ref 8.5–10.5)
Chloride: 101 (ref 98–107)
Creatinine: 1.3 mg/dL (ref 0.7–1.3)
Globulin: 3.2 g/dL (ref 2.0–3.6)
Glucose: 217 mg/dL — ABNORMAL HIGH (ref 70–100)
Potassium: 3 — ABNORMAL LOW (ref 3.5–5.1)
Protein, Total: 7.1 g/dL (ref 6.0–8.3)
Sodium: 140 (ref 136–145)

## 2012-08-11 LAB — CBC AND DIFFERENTIAL
Basophils Absolute Automated: 0.05 (ref 0.00–0.20)
Basophils Automated: 0 %
Eosinophils Absolute Automated: 0.06 (ref 0.00–0.70)
Eosinophils Automated: 1 %
Hematocrit: 43.1 % (ref 42.0–52.0)
Hgb: 14.7 g/dL (ref 13.0–17.0)
Immature Granulocytes Absolute: 0.05
Immature Granulocytes: 1 %
Lymphocytes Absolute Automated: 3.01 (ref 0.50–4.40)
Lymphocytes Automated: 32 %
MCH: 27 pg — ABNORMAL LOW (ref 28.0–32.0)
MCHC: 34.1 g/dL (ref 32.0–36.0)
MCV: 79.2 fL — ABNORMAL LOW (ref 80.0–100.0)
MPV: 9.3 fL — ABNORMAL LOW (ref 9.4–12.3)
Monocytes Absolute Automated: 0.65 (ref 0.00–1.20)
Monocytes: 7 %
Neutrophils Absolute: 5.62 (ref 1.80–8.10)
Neutrophils: 60 %
Platelets: 326 (ref 140–400)
RBC: 5.44 (ref 4.70–6.00)
RDW: 13 % (ref 12–15)
WBC: 9.39 (ref 3.50–10.80)

## 2012-08-11 LAB — URINE MICROSCOPIC

## 2012-08-11 LAB — URINALYSIS
Bilirubin, UA: NEGATIVE
Glucose, UA: >=1000 — AB
Ketones UA: 15 — AB
Leukocyte Esterase, UA: NEGATIVE
Nitrite, UA: NEGATIVE
Protein, UR: NEGATIVE
Specific Gravity UA: 1.02 (ref 1.001–1.035)
Urine pH: 6.5 (ref 5.0–8.0)
Urobilinogen, UA: 0.2 mg/dL (ref 0.2–2.0)

## 2012-08-11 LAB — LIPASE: Lipase: 71 U/L (ref 8–78)

## 2012-08-11 LAB — MAGNESIUM: Magnesium: 2 mg/dL (ref 1.6–2.6)

## 2012-08-11 LAB — POCT GLUCOSE
Whole Blood Glucose POCT: 216 mg/dL — AB (ref 70–100)
Whole Blood Glucose POCT: 192 mg/dL — AB (ref 70–100)
Whole Blood Glucose POCT: 179 mg/dL — AB (ref 70–100)

## 2012-08-11 LAB — GFR: EGFR: >60

## 2012-08-11 MED ORDER — GLUCOSE 40 % PO GEL
15.00 g | ORAL | Status: DC | PRN
Start: 2012-08-11 — End: 2012-08-12

## 2012-08-11 MED ORDER — NALOXONE HCL 0.4 MG/ML IJ SOLN
0.2000 mg | INTRAMUSCULAR | Status: DC | PRN
Start: 2012-08-11 — End: 2012-08-12

## 2012-08-11 MED ORDER — ACETAMINOPHEN 325 MG PO TABS
650.0000 mg | ORAL_TABLET | ORAL | Status: DC | PRN
Start: 2012-08-11 — End: 2012-08-12

## 2012-08-11 MED ORDER — HYDROMORPHONE HCL PF 1 MG/ML IJ SOLN
1.0000 mg | INTRAMUSCULAR | Status: DC | PRN
Start: 2012-08-11 — End: 2012-08-12
  Administered 2012-08-11 – 2012-08-12 (×3): 1 mg via INTRAVENOUS
  Filled 2012-08-11 (×3): qty 1

## 2012-08-11 MED ORDER — INSULIN GLARGINE 100 UNIT/ML SC SOLN
18.0000 [IU] | Freq: Every evening | SUBCUTANEOUS | Status: DC
Start: 2012-08-11 — End: 2012-08-12
  Administered 2012-08-12: 18 [IU] via SUBCUTANEOUS
  Filled 2012-08-11: qty 180

## 2012-08-11 MED ORDER — GLUCAGON HCL (RDNA) 1 MG IJ SOLR
1.00 mg | INTRAMUSCULAR | Status: DC | PRN
Start: 2012-08-11 — End: 2012-08-12

## 2012-08-11 MED ORDER — SODIUM CHLORIDE 0.9 % IV BOLUS
1000.0000 mL | Freq: Once | INTRAVENOUS | Status: AC
Start: 2012-08-11 — End: 2012-08-11
  Administered 2012-08-11: 1000 mL via INTRAVENOUS

## 2012-08-11 MED ORDER — SODIUM CHLORIDE 0.9 % IV SOLN
INTRAVENOUS | Status: DC
Start: 2012-08-11 — End: 2012-08-12

## 2012-08-11 MED ORDER — ONDANSETRON HCL 4 MG/2ML IJ SOLN
4.0000 mg | Freq: Four times a day (QID) | INTRAMUSCULAR | Status: DC | PRN
Start: 2012-08-11 — End: 2012-08-11

## 2012-08-11 MED ORDER — PANTOPRAZOLE SODIUM 40 MG IV SOLR
40.0000 mg | Freq: Two times a day (BID) | INTRAVENOUS | Status: DC
Start: 2012-08-11 — End: 2012-08-12
  Administered 2012-08-11 – 2012-08-12 (×2): 40 mg via INTRAVENOUS
  Filled 2012-08-11 (×2): qty 40

## 2012-08-11 MED ORDER — SODIUM CHLORIDE 0.9 % IV SOLN
500.0000 mg | INTRAVENOUS | Status: DC
Start: 2012-08-11 — End: 2012-08-12
  Administered 2012-08-11 – 2012-08-12 (×2): 500 mg via INTRAVENOUS
  Filled 2012-08-11 (×2): qty 500

## 2012-08-11 MED ORDER — PROMETHAZINE HCL 25 MG/ML IJ SOLN
INTRAMUSCULAR | Status: AC
Start: 2012-08-11 — End: 2012-08-11
  Administered 2012-08-11: 12.5 mg
  Filled 2012-08-11: qty 1

## 2012-08-11 MED ORDER — AMLODIPINE BESYLATE 5 MG PO TABS
5.0000 mg | ORAL_TABLET | Freq: Every day | ORAL | Status: DC
Start: 2012-08-11 — End: 2012-08-12
  Administered 2012-08-11 – 2012-08-12 (×2): 5 mg via ORAL
  Filled 2012-08-11 (×2): qty 1

## 2012-08-11 MED ORDER — ONDANSETRON HCL 4 MG/2ML IJ SOLN
4.0000 mg | INTRAMUSCULAR | Status: AC | PRN
Start: 2012-08-11 — End: 2012-08-11
  Administered 2012-08-11: 4 mg via INTRAVENOUS
  Filled 2012-08-11: qty 2

## 2012-08-11 MED ORDER — CLONIDINE HCL 0.2 MG/24HR TD PTWK
1.0000 | MEDICATED_PATCH | TRANSDERMAL | Status: DC
Start: 2012-08-11 — End: 2012-08-12
  Administered 2012-08-11: 1 via TRANSDERMAL
  Filled 2012-08-11: qty 1

## 2012-08-11 MED ORDER — HYDROMORPHONE HCL PF 1 MG/ML IJ SOLN
1.0000 mg | Freq: Once | INTRAMUSCULAR | Status: AC
Start: 2012-08-11 — End: 2012-08-11
  Administered 2012-08-11: 1 mg via INTRAVENOUS
  Filled 2012-08-11: qty 1

## 2012-08-11 MED ORDER — HYDRALAZINE HCL 20 MG/ML IJ SOLN
10.0000 mg | Freq: Four times a day (QID) | INTRAMUSCULAR | Status: DC | PRN
Start: 2012-08-11 — End: 2012-08-12
  Administered 2012-08-11: 10 mg via INTRAVENOUS
  Filled 2012-08-11: qty 1

## 2012-08-11 MED ORDER — DEXTROSE 50 % IV SOLN
25.0000 mL | INTRAVENOUS | Status: DC | PRN
Start: 2012-08-11 — End: 2012-08-12

## 2012-08-11 MED ORDER — SUCRALFATE 1 G PO TABS
1.00 g | ORAL_TABLET | Freq: Three times a day (TID) | ORAL | Status: DC
Start: 2012-08-11 — End: 2012-08-12
  Administered 2012-08-11 – 2012-08-12 (×2): 1 g via ORAL
  Filled 2012-08-11 (×5): qty 1

## 2012-08-11 MED ORDER — PROMETHAZINE HCL 25 MG/ML IJ SOLN
12.5000 mg | Freq: Four times a day (QID) | INTRAMUSCULAR | Status: DC | PRN
Start: 2012-08-11 — End: 2012-08-12
  Administered 2012-08-11: 12.5 mg via INTRAVENOUS
  Filled 2012-08-11: qty 1

## 2012-08-11 MED ORDER — ONDANSETRON HCL 4 MG/2ML IJ SOLN
4.0000 mg | Freq: Once | INTRAMUSCULAR | Status: AC
Start: 2012-08-11 — End: 2012-08-11
  Administered 2012-08-11: 4 mg via INTRAVENOUS
  Filled 2012-08-11: qty 2

## 2012-08-11 MED ORDER — ENOXAPARIN SODIUM 40 MG/0.4ML SC SOLN
40.0000 mg | Freq: Every day | SUBCUTANEOUS | Status: DC
Start: 2012-08-11 — End: 2012-08-12
  Administered 2012-08-11 – 2012-08-12 (×2): 40 mg via SUBCUTANEOUS
  Filled 2012-08-11 (×2): qty 0.4

## 2012-08-11 MED ORDER — PROCHLORPERAZINE EDISYLATE 5 MG/ML IJ SOLN
10.0000 mg | Freq: Once | INTRAMUSCULAR | Status: AC
Start: 2012-08-11 — End: 2012-08-11
  Administered 2012-08-11: 10 mg via INTRAVENOUS
  Filled 2012-08-11: qty 2

## 2012-08-11 MED ORDER — INSULIN ASPART 100 UNIT/ML SC SOLN
1.0000 [IU] | Freq: Three times a day (TID) | SUBCUTANEOUS | Status: DC | PRN
Start: 2012-08-11 — End: 2012-08-12
  Administered 2012-08-12 (×2): 2 [IU] via SUBCUTANEOUS
  Filled 2012-08-11: qty 20

## 2012-08-11 MED ORDER — METOCLOPRAMIDE HCL 5 MG/ML IJ SOLN
10.0000 mg | Freq: Four times a day (QID) | INTRAMUSCULAR | Status: DC
Start: 2012-08-11 — End: 2012-08-12
  Administered 2012-08-11 – 2012-08-12 (×3): 10 mg via INTRAVENOUS
  Filled 2012-08-11 (×7): qty 2

## 2012-08-11 MED ORDER — POTASSIUM CHLORIDE 10 MEQ/100ML IV SOLN
10.0000 meq | Freq: Once | INTRAVENOUS | Status: AC
Start: 2012-08-11 — End: 2012-08-11
  Administered 2012-08-11: 10 meq via INTRAVENOUS
  Filled 2012-08-11: qty 100

## 2012-08-11 MED ORDER — PROMETHAZINE HCL 25 MG/ML IJ SOLN
12.5000 mg | Freq: Once | INTRAMUSCULAR | Status: AC
Start: 2012-08-11 — End: 2012-08-11
  Administered 2012-08-11: 12.5 mg via INTRAVENOUS
  Filled 2012-08-11: qty 1

## 2012-08-11 NOTE — Progress Notes (Signed)
PROGRESS NOTE    Date Time: 08/11/2012 7:56 PM  Patient Name: [name],[name]      Problem List:     Patient Active Problem List   Diagnosis   . DKA (diabetic ketoacidoses)   . GERD (gastroesophageal reflux disease)   . Gastroparesis   . Dehydration, moderate   . Gastritis without bleeding   . AKI (acute kidney injury)   . Type 2 diabetes, uncontrolled, with neuropathy   . Hypertension   . Nausea & vomiting   . Pneumonia   . Intractable vomiting   . Abdominal pain   . DM (diabetes mellitus)   . HTN (hypertension)   . UGI bleed       Subjective:   Recurrent epigastric pain, nausea and vomiting.    Medications:     Current Facility-Administered Medications   Medication Dose Route Frequency   . amLODIPine  5 mg Oral Daily   . azithromycin  500 mg Intravenous Q24H SCH   . cloNIDine  1 patch Transdermal Weekly   . enoxaparin  40 mg Subcutaneous Daily   . [COMPLETED] HYDROmorphone  1 mg Intravenous Once   . insulin glargine  18 Units Subcutaneous QHS   . metoclopramide  10 mg Intravenous Q6H   . [COMPLETED] ondansetron  4 mg Intravenous Once   . pantoprazole  40 mg Intravenous BID   . [COMPLETED] potassium chloride  10 mEq Intravenous Once   . [COMPLETED] prochlorperazine  10 mg Intravenous Once   . [COMPLETED] promethazine       . [COMPLETED] promethazine  12.5 mg Intravenous Once   . [COMPLETED] sodium chloride  1,000 mL Intravenous Once   . sucralfate  1 g Oral TID       Physical Exam:     Filed Vitals:    08/11/12 1751   BP: 169/92   Pulse: 99   Temp:    Resp:    SpO2:        Intake and Output Summary (Last 24 hours) at Date Time  No intake or output data in the 24 hours ending 08/11/12 1956    Chest - clear to auscultation, no wheezes, rales or rhonchi, symmetric air entry  Heart - normal rate, regular rhythm, normal S1, S2, no murmurs, rubs, clicks or gallops  Abdomen - soft, nontender, nondistended, no masses or organomegaly  Extremities - no erythema, induration, or nodules    Labs:       Lab 08/11/12 0919  08/10/12 0610 08/09/12 0641 08/08/12 1036 08/07/12 1120 08/05/12 0754   WBC 9.39 6.51 7.52 9.60 9.20 --   RBC 5.44 4.98 5.21 5.24 5.17 --   HGB 14.7 13.3 13.9 14.2 14.1 --   HCT 43.1 40.1* 42.0 42.1 41.7* --   PLT 326 309 367 369 338 --   GLU 217* 235* 214* 216* -- 176*   BUN 14.0 14.1 14.2 12.5 -- 10.6   CREAT 1.3 1.2 1.4* 1.3 -- 1.1   CA 9.4 9.0 9.5 9.3 -- 9.0   NA 140 141 142 141 -- 143   K 3.0* 3.5 3.9 3.8 -- 3.8   CL 101 103 103 103 -- 106   CO2 25 29 28 23  -- 27   ALT 13 -- -- -- -- 13   AST 15 -- -- -- -- 10   BILITOTAL 0.7 -- -- -- -- 0.5   BILIDIRECT -- -- -- -- -- --   PROT 7.1 -- -- -- -- 5.8*  LIP 71 -- -- -- -- --   INR -- -- -- -- -- --   PTT -- -- -- -- -- --       Lab 08/11/12 0919   LIP 71   AMY --       Rads:     Radiology Results (24 Hour)     Procedure Component Value Units Date/Time    Abdomen 2 View With Chest 1 View [[id number]] Collected:08/11/12 1040    Order Status:Completed  Updated:08/11/12 1051    Narrative:    HISTORY: Vomiting, abdominal pain.    COMPARISON: 08/05/2012 and 08/06/2012.    FINDINGS: PA chest x-ray, KUB and upright abdominal plain films were  obtained. Lungs are clear without focal infiltrate or effusion. Heart  size and pulmonary vasculature is within normal limits.    Postoperative clips are present in the right upper quadrant abdomen.  Bowel gas pattern is normal without evidence of obstruction. Elliptical  curvilinear radioopaque opacity overlies the soft tissues of the right  pelvis. Finding is of unknown etiology. Correlate clinically.  Degenerative changes are noted within the bilateral hips and at the  L5-S1 lumbosacral junction.      Impression:     No acute cardiopulmonary disease. Normal, nonobstructive  bowel gas pattern.    Phillips Odor, MD   08/11/2012 10:46 AM          Assessment:   Recurrent epigastric pain, nausea and vomiting-gastroparesis with possible contribution from possible gastritis seen at time of EGD.    Question of psychiatric  component.  Depression?      Plan:   Follow up on EGD biopsies.  Continue current medical regimen.  Recommend psychiatry consult.    Signed by: [name]

## 2012-08-11 NOTE — H&P (Signed)
[name] [phone]  Outpatient Primary MD for the patient is [name], MD    Assessment & Plan    Recurrent  abd pain with intractable nausea and vomiting :severe gastroparesis associated with his diabetes:   CT abd 6/25 unremarkable ;underwent  EGD 6/23 by Dr Jens Som :gastritis   D/w  GI:Dr Jens Som has recommended to start both relan and erythromicin ATC (pharmacy doesn't have erythromicin here therefore will start on azithromicin); ppi bid   Start dilaudid IV prn  OBSERVATION for now and re evaluate in am  IVF; supportive RX  IDDM: decrease dose of lantus to 18 units since pt is being placed on cears ; ISS; check A1c  GERD (gastroesophageal reflux disease) :ppi   HTN (hypertension):uncontrolled ; switch to clonidine patch ; resume norvasc as tolerated; iv hydralazine prn    AM Labs Ordered, also please review Full Orders  Admission, patients condition and plan of care including tests being ordered have been discussed with the patient  who indicate understanding and agree with the plan and Code Status.  Condition GUARDED  DVT Prohylaxis lovenox  Code Status: full  Type of Admission:OBS; will re assess in am  Estimated Length of Stay (including stay in the ER receiving treatment): <2 midnights  Medical Necessity for stay:intractable NV; abd pain ; gastoparesis      With History of -  Past Medical History   Diagnosis Date   . Diabetes    . Hypertensive disorder    . Gastroparesis       Past Surgical History   Procedure Date   . Cholecystectomy    . Knee arthroscopy w/ acl reconstruction      left   . Egd 08/08/2012     Procedure: EGD;  Surgeon: [name], MD;  Location: [facility];  Service: Gastroenterology;  Laterality: N/A;  w\bx     in for   Chief Complaint   Patient presents with   . Emesis   . Abdominal Pain      HPI  [name] is a 37 y.o. male Who was dced yesterday after admitted for similar c/o ; has had previous similar admissions;states that he was  doing fine until yesterday nigh when he ate a sandwich  for dinner. Started feeling poor while going to bed with recurrence of nausea. States that he did not take reglan but tok erythromicin yesterday. Wake up at night c/o recurrence of epigastric pain, 7/10severity asoociated with  intractable N/V x 10 overnight    ?    ?  Review of Systems   In addition to the HPI above  No Fever-chills,  No Headache, No changes with Vision or hearing,  No problems swallowing food or Liquids,  No Chest pain, Cough or Shortness of Breath,  No Blood in stool or Urine,  No dysuria,  No new skin rashes or bruises,  No new joints pains-aches,   No new weakness, tingling, numbness in any extremity,  No recent weight gain or loss,  No polyuria, polydypsia or polyphagia,  No significant Mental Stressors.  A full 10 point Review of Systems was done, except as stated above, all other Review of Systems were negative.  ?  Social  History   Substance Use Topics   . Smoking status: Never Smoker    . Smokeless tobacco: Never Used   . Alcohol Use: Yes      Comment: socially, once a week maybe       Family  History   Problem Relation Age of Onset   . Diabetes Father    . Diabetes Maternal Grandmother    . Diabetes Paternal Grandmother        Prior to Admission medications    Medication Sig Start Date End Date Taking? Authorizing Provider   amLODIPine (NORVASC) 5 MG tablet Take 1 tablet (5 mg total) by mouth daily. 08/10/12   Anselmo Pickler, MD   cloNIDine (CATAPRES) 0.1 MG tablet Take 1 tablet (0.1 mg total) by mouth every 12 (twelve) hours. 08/10/12   Anselmo Pickler, MD   ERYTHROMYCIN BASE PO Take by mouth.    [provider]   insulin aspart (NOVOLOG) 100 UNIT/ML injection Inject 1-8 Units into the skin 4 times daily - with meals and at bedtime. 06/25/12   Anselmo Pickler, MD   insulin glargine (LANTUS) 100 UNIT/ML injection Inject 36 Units into the skin nightly. 06/25/12   Anselmo Pickler, MD   ondansetron (ZOFRAN ODT) 4 MG  disintegrating tablet Take 1 tablet (4 mg total) by mouth every 6 (six) hours as needed for Nausea. 08/04/12   Latanya Presser, MD   ondansetron (ZOFRAN) 8 MG tablet Take 1 tablet (8 mg total) by mouth every 8 (eight) hours as needed for Nausea. 06/30/12   Arvilla Meres, MD   ondansetron (ZOFRAN-ODT) 4 MG disintegrating tablet Take 1 tablet (4 mg total) by mouth every 8 (eight) hours as needed for Nausea. 08/10/12   Anselmo Pickler, MD   oxyCODONE (ROXICODONE) 5 MG immediate release tablet Take 1 tablet (5 mg total) by mouth every 8 (eight) hours as needed for Pain. 08/10/12   Anselmo Pickler, MD   pantoprazole (PROTONIX) 40 MG tablet Take 1 tablet (40 mg total) by mouth 2 (two) times daily. 08/10/12   Anselmo Pickler, MD   sucralfate (CARAFATE) 1 G tablet Take 1 tablet (1 g total) by mouth 3 (three) times daily. 08/10/12   Anselmo Pickler, MD   traMADol (ULTRAM) 50 MG tablet Take 1 tablet (50 mg total) by mouth every 6 (six) hours as needed. 08/10/12   Anselmo Pickler, MD     No Known Allergies  Physical Exam  Vitals  Blood pressure 190/94, pulse 99, temperature 98 F (36.7 C), resp. rate 18, height 1.905 m (6\' 3" ), weight 122.471 kg (270 lb), SpO2 96.00%.  ?  1. General :lying in bed in NAD, *  2. Normal affect and insight, Not Suicidal or Homicidal, Awake Alert, Oriented *3.  3. No F.N deficits, ALL C.Nerves Intact, Strength 5/5 all 4 extremities, Sensation intact all 4 extremities, Plantars down going.  4. Ears and Eyes appear Normal, Conjunctivae clear, PERRLA. Moist Oral Mucosa.  5. Supple Neck, No JVD, No cervical lymphadenopathy appriciated, No Carotid Bruits.  6. Symmetrical Chest wall movement, Good air movement bilaterally, CTAB.  7. RRR, No Gallops, Rubs or Murmurs, No Parasternal Heave.  8. Positive Bowel Sounds, Abdomen Soft,epigastric tenderness, No organomegaly appriciated,   No rebound -guarding or rigidity.  9. No Cyanosis, Normal Skin Turgor, No Skin Rash or Bruise.  10. Good muscle tone,  joints appear normal , no effusions, Normal ROM.  11. No Palpable Lymph Nodes in Neck or Axillae    Data Review  Labs:     Results for orders placed during the hospital encounter of 08/11/12   CBC AND DIFFERENTIAL       Component Value Range    WBC 9.39  3.50 -  10.80    RBC 5.44  4.70 - 6.00    Hgb 14.7  13.0 - 17.0 g/dL    Hematocrit 75.6  43.3 - 52.0 %    MCV 79.2 (*) 80.0 - 100.0 fL    MCH 27.0 (*) 28.0 - 32.0 pg    MCHC 34.1  32.0 - 36.0 g/dL    RDW 13  12 - 15 %    Platelets 326  140 - 400    MPV 9.3 (*) 9.4 - 12.3 fL    Neutrophils 60  None %    Lymphocytes Automated 32  None %    Monocytes 7  None %    Eosinophils Automated 1  None %    Basophils Automated 0  None %    Immature Granulocyte 1  None %    Neutrophils Absolute 5.62  1.80 - 8.10    Abs Lymph Automated 3.01  0.50 - 4.40    Abs Mono Automated 0.65  0.00 - 1.20    Abs Eos Automated 0.06  0.00 - 0.70    Absolute Baso Automated 0.05  0.00 - 0.20    Absolute Immature Granulocyte 0.05  0   COMPREHENSIVE METABOLIC PANEL       Component Value Range    Glucose 217 (*) 70 - 100 mg/dL    BUN 29.5  9.0 - 18.8 mg/dL    Creatinine 1.3  0.7 - 1.3 mg/dL    Sodium 416  606 - 301    Potassium 3.0 (*) 3.5 - 5.1    Chloride 101  98 - 107    CO2 25  22 - 29    CALCIUM 9.4  8.5 - 10.5 mg/dL    Protein, Total 7.1  6.0 - 8.3 g/dL    Albumin 3.9  3.5 - 5.0 g/dL    AST (SGOT) 15  5 - 34 U/L    ALT 13  0 - 55 U/L    Alkaline Phosphatase 79  40 - 150 U/L    Bilirubin, Total 0.7  0.2 - 1.2 mg/dL    Globulin 3.2  2.0 - 3.6 g/dL    Albumin/Globulin Ratio 1.2  0.9 - 2.2    Anion Gap 14.0  5.0 - 15.0   LIPASE       Component Value Range    Lipase 71  8 - 78 U/L   URINALYSIS       Component Value Range    Urine Type Clean Catch      Color, UA YELLOW  Clear - Yellow    Clarity, UA CLEAR  Clear - Hazy    Specific Gravity UA 1.020  1.001-1.035    Urine pH 6.5  5.0-8.0    Leukocyte Esterase, UA NEGATIVE  Negative    Nitrite, UA NEGATIVE  Negative    Protein, UA NEGATIVE  Negative     Glucose, UA >=1000 (*) Negative    Ketones UA 15 (*) Negative    Urobilinogen, UA 0.2  0.2-2.0 mg/dL    Bilirubin, UA NEGATIVE  Negative    Blood, UA TRACE-LYSED (*) Negative   GFR       Component Value Range    EGFR >60.0     POCT GLUCOSE       Component Value Range    POCT Glucose WB 216 (*) 70 - 100 mg/dL   URINE MICROSCOPIC       Component Value Range    RBC, UA 0 - 5  0 - 5    WBC, UA 0 - 5  0 - 5    Squamous Epithelial Cells, Urine 0 - 5  0 - 25    Urine Bacteria Few (*) Rare   MAGNESIUM       Component Value Range    Magnesium 2.0  1.6 - 2.6 mg/dL         Rads:     Radiology Results (24 Hour)     Procedure Component Value Units Date/Time    Abdomen 2 View With Chest 1 View [[id number]] Collected:08/11/12 1040    Order Status:Completed  Updated:08/11/12 1051    Narrative:    HISTORY: Vomiting, abdominal pain.    COMPARISON: 08/05/2012 and 08/06/2012.    FINDINGS: PA chest x-ray, KUB and upright abdominal plain films were  obtained. Lungs are clear without focal infiltrate or effusion. Heart  size and pulmonary vasculature is within normal limits.    Postoperative clips are present in the right upper quadrant abdomen.  Bowel gas pattern is normal without evidence of obstruction. Elliptical  curvilinear radioopaque opacity overlies the soft tissues of the right  pelvis. Finding is of unknown etiology. Correlate clinically.  Degenerative changes are noted within the bilateral hips and at the  L5-S1 lumbosacral junction.      Impression:     No acute cardiopulmonary disease. Normal, nonobstructive  bowel gas pattern.    Phillips Odor, MD   08/11/2012 10:46 AM          Personally reviewed Old Chart from Date Time: 08/08/2012 5:29 PM   Patient Name:    [name]   Date of Operation:    08/04/2012 - 08/08/2012   Providers Performing:    Surgeon(s):   [name], MD   Assistant (s):    Charisse Klinefelter, RN - Circulator   Butts, Delarease - Endo Tech   Operative Procedure:    Procedure(s):   EGD    Preoperative Diagnosis:    Pre-Op Diagnosis Codes:   * Abdominal pain [789.00]   Anesthesia:    Monitor Anesthesia Care   Estimated Blood Loss:    Minimal   Findings:    Irregular SQ at 40 cm biopsied.   Random esophageal biopsies.   Gastropathy throughout stomach biopsied.   Random biopsies from d2 and bulb   Postoperative Diagnosis:    Gastritis.   Complications:    None      Anselmo Pickler M.D on 08/11/2012 at 3:16 PM

## 2012-08-11 NOTE — Progress Notes (Signed)
Pt admitted from ED, potassium gtt infusing. Pt c/o abdominal pain 8/10 and nausea, BP elevated. Oriented to tele monitoring, call bell use, and plan of care. Tele intact, NSL infusing, call bell in reach Pt resting after administration of Dilaudid and Zofran; Hydralazine administered for HTN. Will monitor. Pt denies any questions or needs at this time.

## 2012-08-11 NOTE — ED Provider Notes (Signed)
Physician/Midlevel provider first contact with patient: 08/11/12 8657         History     Chief Complaint   Patient presents with   . Emesis   . Abdominal Pain     HPI Comments: Patient with c/o vomiting, abdominal pain starting last night after d/c from the hospital for the same. No fever. No additional sx.     Patient is a 37 y.o. male presenting with vomiting and abdominal pain. The history is provided by the patient.   Emesis   This is a recurrent problem. Episode onset: last night. The problem occurs continuously. The problem has not changed since onset.There has been no fever. Associated symptoms include abdominal pain. Pertinent negatives include no chills, no cough, no diarrhea, no fever, no myalgias and no URI.   Abdominal Pain  The primary symptoms of the illness include abdominal pain, nausea and vomiting. The primary symptoms of the illness do not include fever, diarrhea or dysuria. Episode onset: last night. The onset of the illness was gradual.   The abdominal pain has been gradually worsening since its onset. The abdominal pain is generalized. The severity of the abdominal pain is 5/10.   Symptoms associated with the illness do not include chills, frequency or back pain.       Past Medical History   Diagnosis Date   . Diabetes    . Hypertensive disorder    . Gastroparesis        Past Surgical History   Procedure Date   . Cholecystectomy    . Knee arthroscopy w/ acl reconstruction      left   . Egd 08/08/2012     Procedure: EGD;  Surgeon: [name], MD;  Location: [facility];  Service: Gastroenterology;  Laterality: N/A;  w\bx       Family History   Problem Relation Age of Onset   . Diabetes Father    . Diabetes Maternal Grandmother    . Diabetes Paternal Grandmother        Social  History   Substance Use Topics   . Smoking status: Never Smoker    . Smokeless tobacco: Never Used   . Alcohol Use: Yes      Comment: socially, once a week maybe       .     No Known  Allergies    Current/Home Medications    AMLODIPINE (NORVASC) 5 MG TABLET    Take 1 tablet (5 mg total) by mouth daily.    CLONIDINE (CATAPRES) 0.1 MG TABLET    Take 1 tablet (0.1 mg total) by mouth every 12 (twelve) hours.    ERYTHROMYCIN BASE PO    Take by mouth.    INSULIN ASPART (NOVOLOG) 100 UNIT/ML INJECTION    Inject 1-8 Units into the skin 4 times daily - with meals and at bedtime.    INSULIN GLARGINE (LANTUS) 100 UNIT/ML INJECTION    Inject 36 Units into the skin nightly.    ONDANSETRON (ZOFRAN ODT) 4 MG DISINTEGRATING TABLET    Take 1 tablet (4 mg total) by mouth every 6 (six) hours as needed for Nausea.    ONDANSETRON (ZOFRAN) 8 MG TABLET    Take 1 tablet (8 mg total) by mouth every 8 (eight) hours as needed for Nausea.    ONDANSETRON (ZOFRAN-ODT) 4 MG DISINTEGRATING TABLET    Take 1 tablet (4 mg total) by mouth every 8 (eight) hours as needed for Nausea.    OXYCODONE (  ROXICODONE) 5 MG IMMEDIATE RELEASE TABLET    Take 1 tablet (5 mg total) by mouth every 8 (eight) hours as needed for Pain.    PANTOPRAZOLE (PROTONIX) 40 MG TABLET    Take 1 tablet (40 mg total) by mouth 2 (two) times daily.    SUCRALFATE (CARAFATE) 1 G TABLET    Take 1 tablet (1 g total) by mouth 3 (three) times daily.    TRAMADOL (ULTRAM) 50 MG TABLET    Take 1 tablet (50 mg total) by mouth every 6 (six) hours as needed.        Review of Systems   Constitutional: Negative for fever and chills.   HENT: Negative for congestion and rhinorrhea.    Eyes: Negative for pain and visual disturbance.   Respiratory: Negative for cough.    Cardiovascular: Negative for chest pain and palpitations.   Gastrointestinal: Positive for nausea, vomiting and abdominal pain. Negative for diarrhea.   Genitourinary: Negative for dysuria and frequency.   Musculoskeletal: Negative for myalgias and back pain.   Skin: Negative for color change and rash.   Neurological: Negative for weakness and numbness.       Physical Exam    BP 190/94  Pulse 99  Temp 98 F (36.7  C)  Resp 18  Ht 1.905 m  Wt 122.471 kg  BMI 33.75 kg/m2  SpO2 96%    Physical Exam   Constitutional: He is oriented to person, place, and time. He appears well-developed and well-nourished.   HENT:   Head: Normocephalic and atraumatic.   Eyes: Conjunctivae normal and EOM are normal.   Neck: No JVD present. No tracheal deviation present.   Cardiovascular: Normal rate and regular rhythm.  Exam reveals no gallop and no friction rub.    No murmur heard.  Pulmonary/Chest: Effort normal and breath sounds normal. No stridor. No respiratory distress. He has no wheezes. He has no rales.   Abdominal: Soft. Bowel sounds are normal. He exhibits distension. There is tenderness (upper abdomen, mild). There is no rebound and no guarding.   Musculoskeletal: Normal range of motion. He exhibits no edema and no tenderness.   Neurological: He is alert and oriented to person, place, and time.   Skin: Skin is warm and dry. No rash noted. No erythema. No pallor.   Psychiatric: He has a normal mood and affect. His behavior is normal. Judgment and thought content normal.       MDM and ED Course     ED Medication Orders      Start     Status Ordering Provider    08/11/12 1427   promethazine (PHENERGAN) 25 MG/ML injection      Comments: Created by cabinet override        Last MAR action:  Given Makani Seckman    08/11/12 1328   potassium chloride 10 mEq in 100 mL IVPB (premix)   Once      Route: Intravenous  Ordered Dose: 10 mEq         Last MAR action:  New Bag Aaban Griep    08/11/12 1121   prochlorperazine (COMPAZINE) injection 10 mg   Once      Route: Intravenous  Ordered Dose: 10 mg         Last MAR action:  Given LUONG, TRANG    08/11/12 0925   HYDROmorphone (DILAUDID) injection 1 mg   Once      Route: Intravenous  Ordered Dose: 1 mg  Last MAR action:  Given Avi Kerschner    08/11/12 1610   promethazine (PHENERGAN) injection 12.5 mg   Once      Route: Intravenous  Ordered Dose: 12.5 mg         Last MAR action:   Given Kenji Mapel, [facility]    08/11/12 0909   sodium chloride 0.9 % bolus 1,000 mL   Once      Route: Intravenous  Ordered Dose: 1,000 mL         Last MAR action:  Stopped Cenia Zaragosa    08/11/12 0909   ondansetron (ZOFRAN) injection 4 mg   Once      Route: Intravenous  Ordered Dose: 4 mg         Last MAR action:  Given Bryahna Lesko                 MDM  Number of Diagnoses or Management Options  Diagnosis management comments: I, Earline Mayotte MD, have been the primary provider for [name] during this Emergency Dept visit.    Patient unable to tolerate po at this time. Will require admission for additional IV hydration.         Procedures    Clinical Impression & Disposition     Clinical Impression  Final diagnoses:   Intractable vomiting        ED Disposition     Admit Bed Type: General [8]  Admitting Physician: Mary Sella [[id number]]  Patient Class: Observation [104]             New Prescriptions    No medications on file               Earline Mayotte, MD  08/11/12 [phone]

## 2012-08-11 NOTE — ED Notes (Signed)
Pt c/o intractable N/V, abd pain. Pt was discharged from inpt admission for gastroporesis, gastritis less then 24 hours ago.

## 2012-08-12 DIAGNOSIS — E1149 Type 2 diabetes mellitus with other diabetic neurological complication: Secondary | ICD-10-CM

## 2012-08-12 DIAGNOSIS — E860 Dehydration: Secondary | ICD-10-CM

## 2012-08-12 DIAGNOSIS — K2970 Gastritis, unspecified, without bleeding: Secondary | ICD-10-CM

## 2012-08-12 DIAGNOSIS — R112 Nausea with vomiting, unspecified: Secondary | ICD-10-CM

## 2012-08-12 DIAGNOSIS — K3184 Gastroparesis: Secondary | ICD-10-CM

## 2012-08-12 LAB — CBC AND DIFFERENTIAL
Basophils Absolute Automated: 0.03 (ref 0.00–0.20)
Basophils Automated: 0 %
Eosinophils Absolute Automated: 0.04 (ref 0.00–0.70)
Eosinophils Automated: 1 %
Hematocrit: 39.3 % — ABNORMAL LOW (ref 42.0–52.0)
Hgb: 13.1 g/dL (ref 13.0–17.0)
Immature Granulocytes Absolute: 0.04
Immature Granulocytes: 1 %
Lymphocytes Absolute Automated: 1.94 (ref 0.50–4.40)
Lymphocytes Automated: 31 %
MCH: 26.8 pg — ABNORMAL LOW (ref 28.0–32.0)
MCHC: 33.3 g/dL (ref 32.0–36.0)
MCV: 80.4 fL (ref 80.0–100.0)
MPV: 9.2 fL — ABNORMAL LOW (ref 9.4–12.3)
Monocytes Absolute Automated: 0.54 (ref 0.00–1.20)
Monocytes: 9 %
Neutrophils Absolute: 3.66 (ref 1.80–8.10)
Neutrophils: 59 %
Platelets: 279 (ref 140–400)
RBC: 4.89 (ref 4.70–6.00)
RDW: 13 % (ref 12–15)
WBC: 6.21 (ref 3.50–10.80)

## 2012-08-12 LAB — BASIC METABOLIC PANEL
Anion Gap: 10 (ref 5.0–15.0)
BUN: 11.2 mg/dL (ref 9.0–21.0)
CO2: 25 (ref 22–29)
Calcium: 8.5 mg/dL (ref 8.5–10.5)
Chloride: 105 (ref 98–107)
Creatinine: 1.1 mg/dL (ref 0.7–1.3)
Glucose: 226 mg/dL — ABNORMAL HIGH (ref 70–100)
Potassium: 3.6 (ref 3.5–5.1)
Sodium: 140 (ref 136–145)

## 2012-08-12 LAB — POCT GLUCOSE: Whole Blood Glucose POCT: 221 mg/dL — AB (ref 70–100)

## 2012-08-12 LAB — GFR: EGFR: >60

## 2012-08-12 LAB — HEMOGLOBIN A1C: Hemoglobin A1C: 10.3 % — ABNORMAL HIGH (ref 0.0–6.0)

## 2012-08-12 MED ORDER — METOCLOPRAMIDE HCL 10 MG PO TABS
10.0000 mg | ORAL_TABLET | Freq: Three times a day (TID) | ORAL | Status: DC
Start: 2012-08-12 — End: 2012-10-05

## 2012-08-12 NOTE — Progress Notes (Signed)
Pt all d/c instruction given. Pt walk out to car self.

## 2012-08-12 NOTE — Discharge Instructions (Signed)
Diet: Diabetes  Food is an important tool that you can use to control diabetes and stay healthy. Eating well-balanced meals in the correct amounts will help you control your blood glucose levels and prevent low blood sugar reactions. It will also help you reduce the health risks of diabetes. A registered dietitian (RD) will explain the diabetes diet and help you plan meals and snacks that are healthy to eat. If you have any questions, do not hesitate to call the dietitian for advice.       Guidelines For Success:   Consult with your doctor before starting a diabetes diet or weight loss program. If you have not yet consulted a dietitian, ask your doctor for a referral.   Select foods from the six food groups. Your dietitian will advise you on food choices within each group, serving sizes and how many servings you can have at each meal.    Grains, beans and starchy vegetables    Vegetables    Fruit    Milk or yogurt    Meats    Fats, sweets and alcohol (only a small amount from this group)    Monitor your blood sugar levels as requested by your doctor. Take any medicine as prescribed by your doctor.   Learn to read nutrition labels and select appropriate portion sizes.   Eat only the amount of food in your meal plan. Eat about the same amount of food at regular times each day. Do not skip meals. Eat meals 4 to 5 hours apart, with snacks in between.   Limit alcohol. It raises blood sugar levels. Drink water or calorie-free diet drinks that use safe sweeteners.   Eat less fat to help lower your risk of heart disease. Use non-fat or low-fat dairy products and lean meats. Avoid fried foods. Use cooking oils that are unsaturated.   Talk to your nutritionist about safe sugar substitutes.   Avoid added salt. It can contribute to high blood pressure, which can cause heart disease. People with diabetes already have a risk of high blood pressure and heart disease.   Maintain a healthy weight. If you need to  lose weight, cut down on your portion sizes. But do not skip meals. Exercise is an important part of any weight management program. Talk to your doctor about an exercise program that is right for you.   For more information about the best diet plan for you, talk with a registered dietitian (RD). To obtain a referral to an RD in your area, contact:    [employer]y [employer] Dietetics www.eatright.org    The American Diabetes Association 800-342-2382 www.diabetes.org    2000-2014 [employer], [address], [place] [address]. All rights reserved. This information is not intended as a substitute for professional medical care. Always follow your healthcare professional's instructions.

## 2012-08-12 NOTE — Progress Notes (Signed)
PM assessment completed. Pt vomiting, abd pain. HR elevated to 150-160's while actively vomiting. Returns to normal shortly after episodes. Dr. Jens Som into see pt. Pt med with dilaudid. Page placed for antinausea med. ivf infusing per order. Pt updated and aware of POC. No distress. Will monitor.

## 2012-08-13 NOTE — Discharge Summary (Signed)
Discharge Date: 08/12/2012     ATTENDING PHYSICIAN:  Judson Roch, MD     DISCHARGE DIAGNOSES:  1.  Diabetic gastroparesis exacerbation.  2.  Diabetes mellitus type 2.  3.  Diabetic neuropathy.  4.  Hypertension.  5.  Intractable nausea and vomiting, resolving.  6.  Abdominal pain due to intractable nausea, vomiting, resolved.  7.  Hypertension, controlled.  8.  Dehydration.     CONSULTATIONS:  Dr. [name], gastroenterology.     HISTORY AND HOSPITAL COURSE:  Mr. [name] is a 37 year old pleasant man with a past medical history  significant for diabetes mellitus type 2, who was recently discharged from  the hospital with similar complaints of nausea, vomiting, and diffuse  abdominal pain.  The patient has had multiple admissions for similar  reason, and he was doing fine until 1 day before the admission to the  hospital this time.  The patient ate a sandwich for dinner and subsequently  started developing nausea and nonbilious, nonbloody vomitus while going to  bed with diffuse abdominal pain.  The stated that he did not take Reglan  but took erythromycin instead, and when patient woke up, he had epigastric  pain, 7/10 in severity, nonradiating associated with intractable nausea and  vomiting.  Ten episodes overnight.  Therefore, the patient came to the  emergency room.  In the ED, the patient was started on IV fluids and was  started on Reglan as well.  Per gastroenterology consultation, the patient  is to continue with Reglan and erythromycin at home as well.  The patient  was placed in observation status for intractable nausea and vomiting.  We  continued Reglan for nausea and vomiting.  With IV hydration, the patient's  dehydration was resolved and the patient's nausea, vomiting resolved.  The  patient was started on a diabetic diet, which the patient tolerated well.   Currently, the patient has no abdominal pain.  The patient is ready for  discharge to follow up with Dr. Jens Som in  gastroenterology clinic within  1 week.     CONDITION UPON DISCHARGE:  Hemodynamically stable, afebrile.     DISCHARGE MEDICATIONS:  Please see medical reconciliation form for discharge medications.     FOLLOWUP INSTRUCTIONS:  Follow up with your primary care physician, Dr. Alben Spittle, within 1 week and  follow up with Dr. Tobias Alexander in endocrinology clinic within 1 week as  well.           D:  08/12/2012 14:08 PM by Dr. Judson Roch, MD ([id number])  T:  08/13/2012 07:13 AM by Minda Meo      (Conf: [id number]) (Doc ID: [id number])

## 2012-08-21 ENCOUNTER — Observation Stay: Payer: BC Managed Care – PPO | Admitting: Family Medicine

## 2012-08-21 ENCOUNTER — Observation Stay
Admission: EM | Admit: 2012-08-21 | Discharge: 2012-08-23 | Disposition: A | Discharge status: Home / Self Care | Payer: BC Managed Care – PPO | Attending: Family Medicine | Admitting: Family Medicine

## 2012-08-21 DIAGNOSIS — E1143 Type 2 diabetes mellitus with diabetic autonomic (poly)neuropathy: Secondary | ICD-10-CM | POA: Diagnosis present

## 2012-08-21 DIAGNOSIS — K219 Gastro-esophageal reflux disease without esophagitis: Secondary | ICD-10-CM | POA: Insufficient documentation

## 2012-08-21 DIAGNOSIS — E1149 Type 2 diabetes mellitus with other diabetic neurological complication: Principal | ICD-10-CM | POA: Insufficient documentation

## 2012-08-21 DIAGNOSIS — R1110 Vomiting, unspecified: Secondary | ICD-10-CM

## 2012-08-21 DIAGNOSIS — I10 Essential (primary) hypertension: Secondary | ICD-10-CM | POA: Diagnosis present

## 2012-08-21 DIAGNOSIS — K3184 Gastroparesis: Secondary | ICD-10-CM | POA: Insufficient documentation

## 2012-08-21 LAB — CBC AND DIFFERENTIAL
Basophils Absolute Automated: 0.01 (ref 0.00–0.20)
Basophils Automated: 0 %
Eosinophils Absolute Automated: 0.05 (ref 0.00–0.70)
Eosinophils Automated: 1 %
Hematocrit: 41.2 % — ABNORMAL LOW (ref 42.0–52.0)
Hgb: 13.2 g/dL (ref 13.0–17.0)
Immature Granulocytes Absolute: 0.01
Immature Granulocytes: 0 %
Lymphocytes Absolute Automated: 0.57 (ref 0.50–4.40)
Lymphocytes Automated: 7 %
MCH: 26.8 pg — ABNORMAL LOW (ref 28.0–32.0)
MCHC: 32 g/dL (ref 32.0–36.0)
MCV: 83.6 fL (ref 80.0–100.0)
MPV: 9.7 fL (ref 9.4–12.3)
Monocytes Absolute Automated: 0.23 (ref 0.00–1.20)
Monocytes: 3 %
Neutrophils Absolute: 6.93 (ref 1.80–8.10)
Neutrophils: 89 %
Nucleated RBC: 0 (ref 0–1)
Platelets: 344 (ref 140–400)
RBC: 4.93 (ref 4.70–6.00)
RDW: 13 % (ref 12–15)
WBC: 7.8 (ref 3.50–10.80)

## 2012-08-21 LAB — BASIC METABOLIC PANEL
BUN: 11 mg/dL (ref 9.0–21.0)
CO2: 25 (ref 22–29)
Calcium: 9.1 mg/dL (ref 8.5–10.5)
Chloride: 104 (ref 98–107)
Creatinine: 1.3 mg/dL (ref 0.7–1.3)
Glucose: 285 mg/dL — ABNORMAL HIGH (ref 70–100)
Potassium: 5.2 — ABNORMAL HIGH (ref 3.5–5.1)
Sodium: 139 (ref 136–145)

## 2012-08-21 LAB — POCT GLUCOSE: Whole Blood Glucose POCT: 249 mg/dL — AB (ref 70–100)

## 2012-08-21 LAB — GFR: EGFR: >60

## 2012-08-21 MED ORDER — ONDANSETRON HCL 4 MG/2ML IJ SOLN
4.0000 mg | Freq: Once | INTRAMUSCULAR | Status: AC
Start: 2012-08-21 — End: 2012-08-22
  Administered 2012-08-22: 4 mg via INTRAVENOUS
  Filled 2012-08-21: qty 2

## 2012-08-21 MED ORDER — SODIUM CHLORIDE 0.9 % IV BOLUS
1000.0000 mL | Freq: Once | INTRAVENOUS | Status: AC
Start: 2012-08-21 — End: 2012-08-22
  Administered 2012-08-22: 1000 mL via INTRAVENOUS

## 2012-08-21 MED ORDER — HYDROMORPHONE HCL PF 1 MG/ML IJ SOLN
1.0000 mg | Freq: Once | INTRAMUSCULAR | Status: AC
Start: 2012-08-21 — End: 2012-08-21
  Administered 2012-08-21: 1 mg via INTRAVENOUS
  Filled 2012-08-21: qty 1

## 2012-08-21 MED ORDER — HYDROMORPHONE HCL PF 1 MG/ML IJ SOLN
0.5000 mg | Freq: Once | INTRAMUSCULAR | Status: AC
Start: 2012-08-21 — End: 2012-08-22
  Administered 2012-08-22: 0.5 mg via INTRAVENOUS
  Filled 2012-08-21: qty 1

## 2012-08-21 MED ORDER — SODIUM CHLORIDE 0.9 % IV BOLUS
1000.0000 mL | Freq: Once | INTRAVENOUS | Status: AC
Start: 2012-08-21 — End: 2012-08-22
  Administered 2012-08-21: 1000 mL via INTRAVENOUS

## 2012-08-21 MED ORDER — ONDANSETRON HCL 4 MG/2ML IJ SOLN
4.0000 mg | Freq: Once | INTRAMUSCULAR | Status: AC
Start: 2012-08-21 — End: 2012-08-21
  Administered 2012-08-21: 4 mg via INTRAVENOUS
  Filled 2012-08-21: qty 2

## 2012-08-21 NOTE — ED Provider Notes (Signed)
Physician/Midlevel provider first contact with patient: 08/21/12 2138             EMERGENCY DEPARTMENT HISTORY AND PHYSICAL EXAM    Date: 08/22/2012  Patient Name: [name]      Disposition and Treatment Plan    Clinical Impression:   1. Gastroparesis    2. Vomiting      Disposition: Admit to hospital       History of Presenting Illness     Chief Complaint   Patient presents with   . Emesis     Context: work  Location: RUQ pain, vomiting  Duration: onset today AM, progressively worsening  Quality: dull and aching  Timing: persistent  Maximum Severity: moderate  Modifying Factors: none  History Obtained From: patient  Additional History: [name] is a 37 y.o. male h/o HTN, gastroparesis, DM, s/p cholecystectomy, p/w vomiting, onset today AM. Pt sts that the sxs began today AM, bu that he went to work, but sxs progressed. Pt with associated fatigue and RUQ pain.  Pt recently discharged from hospitalization on 7/1 for exacerbation of gastroparesis. Pt denies associated fever, diarrhea, CP, or SOB.     Pt with h/o hospitalizations for his gastroparesis exaceration.     Pt took his reglan and zofran today, with minimal relief in sxs.     PCP: [name], MD    Current facility-administered medications:[COMPLETED] HYDROmorphone (DILAUDID) injection 0.5 mg, 0.5 mg, Intravenous, Once, Georgina Peer D, MD, 0.5 mg at 08/22/12 0010;  [COMPLETED] HYDROmorphone (DILAUDID) injection 1 mg, 1 mg, Intravenous, Once, Georgina Peer D, MD, 1 mg at 08/21/12 2239;  [COMPLETED] ondansetron [facility]) injection 4 mg, 4 mg, Intravenous, Once, Shara Blazing, MD, 4 mg at 08/21/12 2238  [COMPLETED] ondansetron (ZOFRAN) injection 4 mg, 4 mg, Intravenous, Once, Shara Blazing, MD, 4 mg at 08/22/12 0006;  [COMPLETED] promethazine (PHENERGAN) injection 12.5 mg, 12.5 mg, Intravenous, Once, Georgina Peer D, MD, 12.5 mg at 08/22/12 0138;  [COMPLETED] sodium chloride 0.9 % bolus 1,000 mL, 1,000 mL, Intravenous, Once,  Georgina Peer D, MD, 1,000 mL at 08/21/12 2242  [COMPLETED] sodium chloride 0.9 % bolus 1,000 mL, 1,000 mL, Intravenous, Once, Georgina Peer D, MD, 1,000 mL at 08/22/12 0005;  [DISCONTINUED] ondansetron (ZOFRAN) injection 4 mg, 4 mg, Intravenous, Once, Shara Blazing, MD  Current outpatient prescriptions:amLODIPine (NORVASC) 5 MG tablet, Take 1 tablet (5 mg total) by mouth daily., Disp: 30 tablet, Rfl: 0;  cloNIDine (CATAPRES) 0.1 MG tablet, Take 1 tablet (0.1 mg total) by mouth every 12 (twelve) hours., Disp: 60 tablet, Rfl: 0;  ERYTHROMYCIN BASE PO, Take by mouth., Disp: , Rfl:   insulin aspart (NOVOLOG) 100 UNIT/ML injection, Inject 1-8 Units into the skin 4 times daily - with meals and at bedtime., Disp: 10 mL, Rfl: 0;  insulin glargine (LANTUS) 100 UNIT/ML injection, Inject 36 Units into the skin nightly., Disp: 1 pen, Rfl: 2;  metoclopramide (REGLAN) 10 MG tablet, Take 1 tablet (10 mg total) by mouth every 8 (eight) hours., Disp: 30 tablet, Rfl: 0  ondansetron (ZOFRAN ODT) 4 MG disintegrating tablet, Take 1 tablet (4 mg total) by mouth every 6 (six) hours as needed for Nausea., Disp: 8 tablet, Rfl: 0;  ondansetron (ZOFRAN) 8 MG tablet, Take 1 tablet (8 mg total) by mouth every 8 (eight) hours as needed for Nausea., Disp: 20 tablet, Rfl: 0  oxyCODONE (ROXICODONE) 5 MG immediate release tablet, Take 1 tablet (5 mg total) by mouth every 8 (eight) hours  as needed for Pain., Disp: 30 tablet, Rfl: 0;  pantoprazole (PROTONIX) 40 MG tablet, Take 1 tablet (40 mg total) by mouth 2 (two) times daily., Disp: 30 tablet, Rfl: 0;  sucralfate (CARAFATE) 1 G tablet, Take 1 tablet (1 g total) by mouth 3 (three) times daily., Disp: 90 tablet, Rfl: 0  traMADol (ULTRAM) 50 MG tablet, Take 1 tablet (50 mg total) by mouth every 6 (six) hours as needed., Disp: 30 tablet, Rfl: 0    Past Medical History     Past Medical History   Diagnosis Date   . Diabetes    . Hypertensive disorder    . Gastroparesis      Past Surgical History    Procedure Date   . Cholecystectomy    . Knee arthroscopy w/ acl reconstruction      left   . Egd 08/08/2012     Procedure: EGD;  Surgeon: [name], MD;  Location: [facility];  Service: Gastroenterology;  Laterality: N/A;  w\bx       Family History     Family History   Problem Relation Age of Onset   . Diabetes Father    . Diabetes Maternal Grandmother    . Diabetes Paternal Grandmother        Social History     History     Social History   . Marital Status: Single     Spouse Name: N/A     Number of Children: N/A   . Years of Education: N/A     Social History Main Topics   . Smoking status: Never Smoker    . Smokeless tobacco: Never Used   . Alcohol Use: Yes      Comment: socially, once a week maybe   . Drug Use: No   . Sexually Active: Not on file     Other Topics Concern   . Not on file     Social History Narrative   . No narrative on file       Allergies     No Known Allergies    Review of Systems     Positive and negative ROS elements as per HPI.  All Other Systems Reviewed and Negative: Yes    Physical Exam   BP 154/91  Pulse 112  Temp 97.9 F (36.6 C)  Resp 20  Ht 1.905 m  Wt 122.5 kg  BMI 33.76 kg/m2  SpO2 98%    PHYSICAL EXAM   CONSTITUTIONAL: Patient is afebrile, Vital signs reviewed, Patient has normal pulse, Patient has hypertensive, Patient has normal respiratory rate, , Alert and oriented X 3. Pt is Lethargic  HEAD: Atraumatic, Normocephalic.   EYES: Eyes are normal to inspection, Pupils equal, round and reactive to light, No discharge from eyes, Extraocular muscles intact, Sclera are normal, Conjunctiva are normal.   ENT: Mouth normal to inspection.   NECK: Normal ROM, Cervical spine nontender.   RESPIRATORY CHEST: Chest is nontender, Breath sounds normal, No respiratory distress.   CARDIOVASCULAR: RRR, Heart sounds normal, No extremity edema.   ABDOMEN: Moderate epigastric TTP, no rebound.  No masses, No distension.   BACK: There is no CVA Tenderness, There is no  tenderness to palpation, Normal inspection.   UPPER EXTREMITY: Inspection normal, No cyanosis, No clubbing, No edema, Normal range of motion.   LOWER EXTREMITY: Inspection normal, No cyanosis, No clubbing, No edema, Normal range of motion, No calf tenderness.   NEURO: GCS is 15, No focal motor deficits, Speech  normal.   SKIN: Skin is warm, Skin is dry, Skin is normal color.   LYMPHATIC: No adenopathy in neck.   PSYCHIATRIC: Oriented X 3, Normal affect.           Diagnostic Study Results   Labs -     Results     Procedure Component Value Units Date/Time    UA with Micro [[id number]] Collected:08/22/12 0129    Specimen Information:Urine Updated:08/22/12 0130    Basic Metabolic Panel (BMP) [[id number]]  (Abnormal) Collected:08/21/12 2257    Specimen Information:Blood Updated:08/21/12 2332     Glucose 285 (H) mg/dL      BUN 24.4 mg/dL      Creatinine 1.3 mg/dL      CALCIUM 9.1 mg/dL      Sodium 010      Potassium 5.2 (H)      Chloride 104      CO2 25     GFR [[id number]] Collected:08/21/12 2257     EGFR >60.0 Updated:08/21/12 2332    CBC with Differential [[id number]]  (Abnormal) Collected:08/21/12 2257    Specimen Information:Blood / Blood Updated:08/21/12 2323     WBC 7.80      RBC 4.93      Hgb 13.2 g/dL      Hematocrit 63.8 ([name]) %      MCV 83.6 fL      MCH 26.8 ([name]) pg      MCHC 32.0 g/dL      RDW 13 %      Platelets 344      MPV 9.7 fL      Neutrophils 89 %      Lymphocytes Automated 7 %      Monocytes 3 %      Eosinophils Automated 1 %      Basophils Automated 0 %      Immature Granulocyte 0 %      Nucleated RBC 0      Neutrophils Absolute 6.93      Abs Lymph Automated 0.57      Abs Mono Automated 0.23      Abs Eos Automated 0.05      Absolute Baso Automated 0.01      Absolute Immature Granulocyte 0.01     Accucheck [[id number]]  (Abnormal) Collected:08/21/12 2126     POCT Glucose WB 249 (A) mg/dL JOACZYS:08/12/14 0109        Radiologic Studies -   Radiology Results (24 Hour)     ** No Results found for the last 24  hours. **      .    EKG Interpretation: N/A    Clinical Course in the Emergency Department     11:57 PM: Pt reassessed. Pt continues to c/o pain. Denies additional vomiting. Plan for additional zofran and pain medication.     1:14 AM: Nurse reassessed. Pt actively vomiting. Pt would like to be admitted. PMD admits to FFx FP. Will page.    1:17 AM: D/w FFx FP Resident.     Medical Decision Making     I am the first provider for this patient.  I reviewed the vital signs, nursing notes, past medical history, past surgical history, family history and social history.      Vital Signs -   Patient Vitals for the past 12 hrs:   BP Temp Pulse Resp   08/22/12 0006 154/91 mmHg - - -   08/21/12 2238 183/95 mmHg - - -   08/21/12 2130 171/96 mmHg - 112  20    08/21/12 2050 221/112 mmHg 97.9 F (36.6 C) 120  20        Pulse Oximetry Analysis - Normal    Differential Diagnosis (not completely inclusive): gastroparesis, vomiting, dehydration    Laboratory results reviewed by EDP: yes  Radiologic study results reviewed by EDP: N\A  Radiologic Studies Interpreted (viewed) by EDP: N\A        _______________________________    Attestations:  I was acting as a scribe for Shara Blazing, MD on [name]  Treatment Team: Scribe: Lora Paula     I am the first provider for this patient and I personally performed the services documented. Treatment Team: Scribe: Lora Paula is scribing for me on Train,Karlos [name]. This note accurately reflects work and decisions made by me.  Shara Blazing, MD  ______________________________          Shara Blazing, MD  08/22/12 [phone]

## 2012-08-21 NOTE — ED Notes (Signed)
Nausea, stomach pain,vomiting x 1 day. Last blood sugar taken this AM 110.

## 2012-08-21 NOTE — ED Notes (Signed)
Pt appears to be causing self inflicting vomiting.

## 2012-08-22 DIAGNOSIS — E1143 Type 2 diabetes mellitus with diabetic autonomic (poly)neuropathy: Secondary | ICD-10-CM | POA: Diagnosis present

## 2012-08-22 LAB — ECG 12-LEAD
Atrial Rate: 96 {beats}/min
P Axis: 72 degrees
P-R Interval: 194 ms
Q-T Interval: 366 ms
QRS Duration: 86 ms
QTC Calculation (Bezet): 462 ms
R Axis: 65 degrees
T Axis: 65 degrees
Ventricular Rate: 96 {beats}/min

## 2012-08-22 LAB — URINALYSIS WITH MICROSCOPIC
Bilirubin, UA: NEGATIVE
Blood, UA: NEGATIVE
Glucose, UA: >1000 — AB
Ketones UA: 40 — AB
Leukocyte Esterase, UA: NEGATIVE
Nitrite, UA: NEGATIVE
Specific Gravity UA: 1.017 (ref 1.001–1.035)
Urine pH: 6.5 (ref 5.0–8.0)
Urobilinogen, UA: 2 mg/dL (ref 0.2–2.0)

## 2012-08-22 LAB — BASIC METABOLIC PANEL
BUN: 9 mg/dL (ref 9.0–21.0)
CO2: 25 (ref 22–29)
Calcium: 8.9 mg/dL (ref 8.5–10.5)
Chloride: 107 (ref 98–107)
Creatinine: 1.1 mg/dL (ref 0.7–1.3)
Glucose: 177 mg/dL — ABNORMAL HIGH (ref 70–100)
Potassium: 4.1 (ref 3.5–5.1)
Sodium: 140 (ref 136–145)

## 2012-08-22 LAB — POCT GLUCOSE
Whole Blood Glucose POCT: 153 mg/dL — AB (ref 70–100)
Whole Blood Glucose POCT: 137 mg/dL — AB (ref 70–100)
Whole Blood Glucose POCT: 126 mg/dL — AB (ref 70–100)
Whole Blood Glucose POCT: 116 mg/dL — AB (ref 70–100)

## 2012-08-22 LAB — GFR: EGFR: >60

## 2012-08-22 MED ORDER — PANTOPRAZOLE SODIUM 40 MG PO TBEC
40.0000 mg | DELAYED_RELEASE_TABLET | Freq: Every morning | ORAL | Status: DC
Start: 2012-08-22 — End: 2012-08-23
  Administered 2012-08-22 – 2012-08-23 (×2): 40 mg via ORAL
  Filled 2012-08-22 (×2): qty 1

## 2012-08-22 MED ORDER — LISINOPRIL 20 MG PO TABS
20.0000 mg | ORAL_TABLET | Freq: Every day | ORAL | Status: DC
Start: 2012-08-22 — End: 2012-08-23
  Administered 2012-08-22 – 2012-08-23 (×2): 20 mg via ORAL
  Filled 2012-08-22 (×2): qty 1

## 2012-08-22 MED ORDER — PROMETHAZINE HCL 25 MG/ML IJ SOLN
12.5000 mg | Freq: Once | INTRAMUSCULAR | Status: AC
Start: 2012-08-22 — End: 2012-08-22
  Administered 2012-08-22: 12.5 mg via INTRAVENOUS
  Filled 2012-08-22: qty 1

## 2012-08-22 MED ORDER — SODIUM CHLORIDE 0.9 % IV SOLN
100.0000 mg | Freq: Three times a day (TID) | INTRAVENOUS | Status: DC
Start: 2012-08-22 — End: 2012-08-23
  Administered 2012-08-22 – 2012-08-23 (×4): 100 mg via INTRAVENOUS
  Filled 2012-08-22 (×9): qty 100

## 2012-08-22 MED ORDER — NALOXONE HCL 0.4 MG/ML IJ SOLN
0.2000 mg | INTRAMUSCULAR | Status: DC | PRN
Start: 2012-08-22 — End: 2012-08-23

## 2012-08-22 MED ORDER — ONDANSETRON HCL 4 MG/2ML IJ SOLN
4.0000 mg | Freq: Once | INTRAMUSCULAR | Status: DC
Start: 2012-08-22 — End: 2012-08-22
  Administered 2012-08-22: 4 mg via INTRAVENOUS

## 2012-08-22 MED ORDER — SUCRALFATE 1 G PO TABS
1.0000 g | ORAL_TABLET | Freq: Three times a day (TID) | ORAL | Status: DC
Start: 2012-08-22 — End: 2012-08-23
  Administered 2012-08-22 – 2012-08-23 (×4): 1 g via ORAL
  Filled 2012-08-22 (×5): qty 1

## 2012-08-22 MED ORDER — PROMETHAZINE HCL 25 MG/ML IJ SOLN
12.5000 mg | Freq: Three times a day (TID) | INTRAMUSCULAR | Status: DC
Start: 2012-08-22 — End: 2012-08-23
  Administered 2012-08-22 – 2012-08-23 (×5): 12.5 mg via INTRAVENOUS
  Filled 2012-08-22 (×4): qty 1

## 2012-08-22 MED ORDER — SODIUM CHLORIDE 0.9 % IV SOLN
INTRAVENOUS | Status: DC
Start: 2012-08-22 — End: 2012-08-23

## 2012-08-22 MED ORDER — AMLODIPINE BESYLATE 5 MG PO TABS
5.0000 mg | ORAL_TABLET | Freq: Every day | ORAL | Status: DC
Start: 2012-08-22 — End: 2012-08-23
  Administered 2012-08-22 – 2012-08-23 (×2): 5 mg via ORAL
  Filled 2012-08-22 (×2): qty 1

## 2012-08-22 MED ORDER — INSULIN GLARGINE 100 UNIT/ML SC SOLN
36.00 [IU] | Freq: Every evening | SUBCUTANEOUS | Status: DC
Start: 2012-08-22 — End: 2012-08-23
  Administered 2012-08-22 (×2): 36 [IU] via SUBCUTANEOUS
  Filled 2012-08-22 (×2): qty 360

## 2012-08-22 MED ORDER — CLONIDINE HCL 0.1 MG PO TABS
0.1000 mg | ORAL_TABLET | Freq: Two times a day (BID) | ORAL | Status: DC
Start: 2012-08-22 — End: 2012-08-23
  Administered 2012-08-22 – 2012-08-23 (×3): 0.1 mg via ORAL
  Filled 2012-08-22 (×3): qty 1

## 2012-08-22 MED ORDER — DEXTROSE 50 % IV SOLN
25.00 mL | INTRAVENOUS | Status: DC | PRN
Start: 2012-08-22 — End: 2012-08-23

## 2012-08-22 MED ORDER — GLUCAGON HCL (RDNA) 1 MG IJ SOLR
1.00 mg | INTRAMUSCULAR | Status: DC | PRN
Start: 2012-08-22 — End: 2012-08-23

## 2012-08-22 MED ORDER — ENOXAPARIN SODIUM 40 MG/0.4ML SC SOLN
40.00 mg | Freq: Every day | SUBCUTANEOUS | Status: DC
Start: 2012-08-22 — End: 2012-08-23
  Administered 2012-08-22 – 2012-08-23 (×2): 40 mg via SUBCUTANEOUS
  Filled 2012-08-22 (×2): qty 0.4

## 2012-08-22 MED ORDER — INSULIN ASPART 100 UNIT/ML SC SOLN
1.0000 [IU] | Freq: Every evening | SUBCUTANEOUS | Status: DC | PRN
Start: 2012-08-22 — End: 2012-08-23

## 2012-08-22 MED ORDER — TRAMADOL HCL 50 MG PO TABS
50.0000 mg | ORAL_TABLET | Freq: Four times a day (QID) | ORAL | Status: DC | PRN
Start: 2012-08-22 — End: 2012-08-23
  Filled 2012-08-22: qty 1

## 2012-08-22 MED ORDER — ONDANSETRON HCL 4 MG/2ML IJ SOLN
4.0000 mg | Freq: Three times a day (TID) | INTRAMUSCULAR | Status: DC
Start: 2012-08-22 — End: 2012-08-23
  Administered 2012-08-22 – 2012-08-23 (×4): 4 mg via INTRAVENOUS
  Filled 2012-08-22 (×4): qty 2

## 2012-08-22 MED ORDER — GLUCOSE 40 % PO GEL
15.00 g | ORAL | Status: DC | PRN
Start: 2012-08-22 — End: 2012-08-23

## 2012-08-22 NOTE — Plan of Care (Signed)
Pt is awake, alert and oriented x 4.No nausea and vomiting this shift .Pt  Received phenergan and Zofran IV as scheduled. Pt will be start on clear liquid diet.Pt ambulates to bathroom with stand by guard assist.

## 2012-08-22 NOTE — H&P (Signed)
ADMISSION HISTORY AND PHYSICAL EXAM    Date Time: 08/22/2012 3:43 AM  Patient Name: [name]  Attending Physician: Shara Blazing, MD  Primary Care Physician: [name], MD    CC: Vomiting      Assessment:   Principal Problem:   *Gastroparesis  Active Problems:   GERD (gastroesophageal reflux disease)   Intractable vomiting   DM (diabetes mellitus)   HTN (hypertension)   Diabetic gastroparesis        37 y/o male with hx IDDM, HTN, recurrent admissions for diabetic gastroparesis p/w intractable vomiting    Plan:   #Diabetic gastroparesis with intractable vomiting  -scheduled zofran and reglan until symptoms improve. No hx of prolonged QTc but will check EKG in AM.   -erythromycin 100 mg IV q8   -cont home carafate, PPI  -consider starting a tricyclic antidepressant? Some evidence of efficacy with gastroparesis, and other agents failing as outpatient  -consider GI consult in AM if not improved  -will need close outpatient f/u and food/stress logs to examine his triggers    #IDDM  -continue with home lantus 36 units nightly  -SSI  -last HA1c on 6/30 was 10.3  -accuchecks q4 until PO intake improves    #HTN  -cont home norvasc, clonidine, lisinopril    FEN/GI  -NS @ 125 until nausea and PO intake improves  -NPO until nausea resolves    ppx - lovenox    Code - Full    Dispo - when vomiting resolves, good PO intake      Disposition:     Anticipated medical stability for discharge: July,  12 - Afternoon  Service status: Observation: Due to nausea/vomiting  Reason for ongoing hospitalization: Intractable vomiting  Anticipated discharge needs: Home      History of Presenting Illness:   [name] is a 37 y.o. male with multiple recent admissions for diabetic gastroparesis presents to the hospital with vomiting. Also has a history of IDDM, HTN, and biliary dyskinesia s/p cholecystectomy 2012. The symptoms began this morning and have progressed, he said he vomited > 20 times today, NBNB. The symptoms  began when he woke up, has not been able to keep any solids or liquids down today. Also has fatigue, RUQ pain. Took reglan and zofran at home which did not relieve it. Denies fever, chills, diarrhea, CP, dyspnea. He was most recently discharged for a gastroparesis exacerbation on 7/1, which improved with reglan and IVF after a few days. He also had an EGD on 6/27 which showed gastritis. GI had recommended a psych consult at the time which had yet to be done. He also has a reported hx of noncompliance with GI medications as an outpatient, and had not taken erythromycin as instructed. However on questioning he says that he took a trial of erythromycin for a few weeks but it did not help, so his PCP said he can d/c it. Previously followed by Dr. Jens Som with GI at [facility]. He is not sure the exact triggers of his vomiting, but over the few days leading up to today he had a steak sandwich, multiple doses of NSAIDs for chronic knee pain, and alcohol. He also believes he is having a lot of stress.    In the ED received 2L bolus of fluid, dilaudid, reglan, which improved symptoms.    Past Medical History:     Past Medical History   Diagnosis Date   . Diabetes    . Hypertensive disorder    .  Gastroparesis        Available old records reviewed, including:  Outpatient and inpatient records.    Past Surgical History:     Past Surgical History   Procedure Date   . Cholecystectomy    . Knee arthroscopy w/ acl reconstruction      left   . Egd 08/08/2012     Procedure: EGD;  Surgeon: [name], MD;  Location[facility];  Service: Gastroenterology;  Laterality: N/A;  w\bx       Family History:     Family History   Problem Relation Age of Onset   . Diabetes Father    . Diabetes Maternal Grandmother    . Diabetes Paternal Grandmother          Social History:     History   Smoking status   . Never Smoker    Smokeless tobacco   . Never Used     History   Alcohol Use   . Yes     Comment: socially, once a week maybe      History   Drug Use No       Allergies:   No Known Allergies    Medications:     Current/Home Medications    AMLODIPINE (NORVASC) 5 MG TABLET    Take 1 tablet (5 mg total) by mouth daily.    CLONIDINE (CATAPRES) 0.1 MG TABLET    Take 1 tablet (0.1 mg total) by mouth every 12 (twelve) hours.    ERYTHROMYCIN BASE PO    Take by mouth.    INSULIN ASPART (NOVOLOG) 100 UNIT/ML INJECTION    Inject 1-8 Units into the skin 4 times daily - with meals and at bedtime.    INSULIN GLARGINE (LANTUS) 100 UNIT/ML INJECTION    Inject 36 Units into the skin nightly.    LISINOPRIL (PRINIVIL,ZESTRIL) 20 MG TABLET    Take 20 mg by mouth daily.    METOCLOPRAMIDE (REGLAN) 10 MG TABLET    Take 1 tablet (10 mg total) by mouth every 8 (eight) hours.    ONDANSETRON (ZOFRAN ODT) 4 MG DISINTEGRATING TABLET    Take 1 tablet (4 mg total) by mouth every 6 (six) hours as needed for Nausea.    ONDANSETRON (ZOFRAN) 8 MG TABLET    Take 1 tablet (8 mg total) by mouth every 8 (eight) hours as needed for Nausea.    OXYCODONE (ROXICODONE) 5 MG IMMEDIATE RELEASE TABLET    Take 1 tablet (5 mg total) by mouth every 8 (eight) hours as needed for Pain.    PANTOPRAZOLE (PROTONIX) 40 MG TABLET    Take 1 tablet (40 mg total) by mouth 2 (two) times daily.    SUCRALFATE (CARAFATE) 1 G TABLET    Take 1 tablet (1 g total) by mouth 3 (three) times daily.    TRAMADOL (ULTRAM) 50 MG TABLET    Take 1 tablet (50 mg total) by mouth every 6 (six) hours as needed.        Method by which medications were confirmed on admission: patient, outpatient records    Review of Systems:   All other systems were reviewed and are negative except:     Review of Systems -  Negative except as described    History obtained from the patient  General ROS: Pt otherwise feeling well, no recent illness.    Ophthalmic ROS: negative for blurry vision or yellowing of the eyes  Allergy and Immunology ROS: no known allergies  as described by the patient  Hematological and Lymphatic ROS: No known  bleeding/clotting disorders  Endocrine ROS: negative  Respiratory ROS: no cough, shortness of breath, or wheezing  Cardiovascular ROS: no chest pain or dyspnea on exertion  Gastrointestinal ROS: +abdominal pain, no change in bowel habits, or black or bloody stools  Genito-Urinary ROS: no dysuria, trouble voiding, or hematuria  Musculoskeletal ROS: negative, no swelling to lower extremities  Neurological ROS: negative  Dermatological ROS: negative          Physical Exam:     Patient Vitals for the past 24 hrs:   BP Temp Pulse Resp SpO2 Height Weight   08/22/12 0300 153/87 mmHg - 96  15  97 % - -   08/22/12 0200 165/92 mmHg - 100  16  98 % - -   08/22/12 0006 154/91 mmHg - - - 98 % - -   08/21/12 2238 183/95 mmHg - - - 99 % - -   08/21/12 2130 171/96 mmHg - 112  20  96 % - -   08/21/12 2050 221/112 mmHg 97.9 F (36.6 C) 120  20  98 % 1.905 m (6\' 3" ) 122.5 kg (270 lb 1 oz)     Body mass index is 33.76 kg/(m^2).  No intake or output data in the 24 hours ending 08/22/12 0343    General: awake, oriented x 3, drowsy, mentating appropriately  HEENT: perrla, eomi, sclera anicteric  oropharynx clear without lesions, mucous membranes moist  Neck: supple, no lymphadenopathy, no thyromegaly, no JVD, no carotid bruits  Cardiovascular: regular rate and rhythm, no murmurs, rubs or gallops  Lungs: clear to auscultation bilaterally, without wheezing, rhonchi, or rales  Abdomen: soft, moderately tender RUQ, no rebound or guarding, non-distended; no palpable masses, no hepatosplenomegaly, normoactive bowel sounds  Extremities: no clubbing, cyanosis, or edema  Neuro: cranial nerves grossly intact, strength 5/5 in upper and lower extremities, sensation intact  Skin: no rashes or lesions noted      Labs:     Results     Procedure Component Value Units Date/Time    UA with Micro [[id number]]  (Abnormal) Collected:08/22/12 0129    Specimen Information:Urine Updated:08/22/12 0212     Urine Type Clean Catch      Color, UA Yellow      Clarity,  UA Clear      Specific Gravity UA 1.017      Urine pH 6.5      Leukocyte Esterase, UA Negative      Nitrite, UA Negative      Protein, UA Trace (A)      Glucose, UA >1000 (A)      Ketones UA 40 (A)      Urobilinogen, UA 2.0 mg/dL      Bilirubin, UA Negative      Blood, UA Negative      RBC, UA 6 - 10 (A)      WBC, UA 0 - 5      Squamous Epithelial Cells, Urine 0 - 5      Urine Bacteria Rare     Basic Metabolic Panel (BMP) [[id number]]  (Abnormal) Collected:08/21/12 2257    Specimen Information:Blood Updated:08/21/12 2332     Glucose 285 (H) mg/dL      BUN 78.2 mg/dL      Creatinine 1.3 mg/dL      CALCIUM 9.1 mg/dL      Sodium 956      Potassium 5.2 (H)  Chloride 104      CO2 25     GFR [[id number]] Collected:08/21/12 2257     EGFR >60.0 Updated:08/21/12 2332    CBC with Differential [[id number]]  (Abnormal) Collected:08/21/12 2257    Specimen Information:Blood / Blood Updated:08/21/12 2323     WBC 7.80      RBC 4.93      Hgb 13.2 g/dL      Hematocrit 24.4 (L) %      MCV 83.6 fL      MCH 26.8 (L) pg      MCHC 32.0 g/dL      RDW 13 %      Platelets 344      MPV 9.7 fL      Neutrophils 89 %      Lymphocytes Automated 7 %      Monocytes 3 %      Eosinophils Automated 1 %      Basophils Automated 0 %      Immature Granulocyte 0 %      Nucleated RBC 0      Neutrophils Absolute 6.93      Abs Lymph Automated 0.57      Abs Mono Automated 0.23      Abs Eos Automated 0.05      Absolute Baso Automated 0.01      Absolute Immature Granulocyte 0.01     Accucheck [[id number]]  (Abnormal) Collected:08/21/12 2126     POCT Glucose WB 249 (A) mg/dL [id number]:42/59/56 3875              Safety Checklist  DVT prophylaxis:  CHEST guideline (See page [id number]) Chemical   Foley:  [place] [facility] Foley protocol Not present   IVs:  Peripheral IV   PT/OT: Not needed   Daily CBC & or Chem ordered:  SHM/ABIM guidelines (see #5) Yes, due to clinical and lab instability   Reference for approximate charges of common labs: CBC auto diff - $76  BMP - $99  Mg -  $79    Signed by: Annie Paras, MD   IE:PPIRJJ, Vania Rea, MD    Addendum:    Patient seen and examined with Dr. Blenda Nicely and [facility].  Physical exam replicated and agree with above.  Pt has had particularly intractable gastroparesis x 6-12 months based on hospital chart review.  This was initially thought to be related to narcotics by [facility] team; however pt was taking minimal narcotics and seems more likely that this is secondary to poorly controlled DM2.  Spent interview assessing GOC and pt willingness to adhere to dietary limits and work on improving glycemic control.  He reports sig stress and was recently advised to see psychiatry.  He believes sxs worsened by emotional stress and has very poor social support.  Would consider starting a TCA for both mood and paresis, and rec psychotherapy on discharge.  Would have nutrition see (again) to discuss paresis diet.  Would request care coordination, repeat diabetes education, and close PCP follow up, ideally weekly, to review a log of symptoms, BS, and adherence to medications.  This episode follows eating a steak sandwich with "a drink", taking "a lot" of ibuprofen for re-torn R ACL, and sig life stress and poor glycemic control.  Stopped erythomycin b/c "not working" but not clear if pt's expectations of sx prevention were appropriate -ie, in the setting of all the triggers it is hard to tell if erytho would help.    Could consider increasing erythro  from maintenance dose for paresis of 100mg  q8 to 300mg  q8, defer this to AM team based on pt progress.    Meade Maw, PGY3  [facility]  Office: [phone]  Spectra: [phone]  After hours: [phone]   Cell: [phone]

## 2012-08-22 NOTE — Plan of Care (Signed)
Pt is A&O X E ,F/C ,MAE ,no nausea /vomitting  this shift , on clear liquid diet  ,tolerating well ,no complain of pain, ambulates independently.  Plan --monitor for nausea/vomitting  ,pain ,pt safety and comfort.

## 2012-08-22 NOTE — Plan of Care (Signed)
Pt admitted from ED to [facility] bed 258-2 at about 0430 via stretcher with gastroparesis. Pt had emesis x 1 on arrival and c/o abdominal pain.  Pt received phenergan 12.5mg  for nausea, but refused to take tramadol for pain. A/O*4, oriented to unit and room. Continue to monitor.

## 2012-08-22 NOTE — Progress Notes (Signed)
[facility] Attending Note    This patient was reviewed on rounds with Dr. Tawni Pummel.  Physical exam confirmed and duplicated.  I agree with assessment and plan as outlined.  Pt interested in starting clear liquid diet. Will repeat BMP this evening.    Patient Active Problem List   Diagnosis   . DKA (diabetic ketoacidoses)   . GERD (gastroesophageal reflux disease)   . Gastroparesis   . Dehydration, moderate   . Gastritis without bleeding   . AKI (acute kidney injury)   . Type 2 diabetes, uncontrolled, with neuropathy   . Hypertension   . Nausea & vomiting   . Pneumonia   . Intractable vomiting   . Abdominal pain   . DM (diabetes mellitus)   . HTN (hypertension)   . UGI bleed   . Diabetic gastroparesis       Traci Sermon  [facility]  [phone] cell  [phone] office

## 2012-08-22 NOTE — ED Notes (Signed)
Pt was constantly sleeping since first dose of Zofran; vomits even after Zofran; has been NPO sin ce arrival; ERP aware and will admit

## 2012-08-23 LAB — CBC AND DIFFERENTIAL
Basophils Absolute Automated: 0.01 (ref 0.00–0.20)
Basophils Automated: 0 %
Eosinophils Absolute Automated: 0.15 (ref 0.00–0.70)
Eosinophils Automated: 2 %
Hematocrit: 39 % — ABNORMAL LOW (ref 42.0–52.0)
Hgb: 12.5 g/dL — ABNORMAL LOW (ref 13.0–17.0)
Immature Granulocytes Absolute: 0.02
Immature Granulocytes: 0 %
Lymphocytes Absolute Automated: 1.54 (ref 0.50–4.40)
Lymphocytes Automated: 24 %
MCH: 26.8 pg — ABNORMAL LOW (ref 28.0–32.0)
MCHC: 32.1 g/dL (ref 32.0–36.0)
MCV: 83.5 fL (ref 80.0–100.0)
MPV: 10 fL (ref 9.4–12.3)
Monocytes Absolute Automated: 0.46 (ref 0.00–1.20)
Monocytes: 7 %
Neutrophils Absolute: 4.14 (ref 1.80–8.10)
Neutrophils: 65 %
Nucleated RBC: 0 (ref 0–1)
Platelets: 347 (ref 140–400)
RBC: 4.67 — ABNORMAL LOW (ref 4.70–6.00)
RDW: 13 % (ref 12–15)
WBC: 6.32 (ref 3.50–10.80)

## 2012-08-23 LAB — BASIC METABOLIC PANEL
BUN: 6 mg/dL — ABNORMAL LOW (ref 9.0–21.0)
CO2: 28 (ref 22–29)
Calcium: 8.5 mg/dL (ref 8.5–10.5)
Chloride: 108 — ABNORMAL HIGH (ref 98–107)
Creatinine: 1 mg/dL (ref 0.7–1.3)
Glucose: 125 mg/dL — ABNORMAL HIGH (ref 70–100)
Potassium: 4 (ref 3.5–5.1)
Sodium: 140 (ref 136–145)

## 2012-08-23 LAB — POCT GLUCOSE
Whole Blood Glucose POCT: 53 mg/dL — AB (ref 70–100)
Whole Blood Glucose POCT: 53 mg/dL — AB (ref 70–100)
Whole Blood Glucose POCT: 55 mg/dL — AB (ref 70–100)
Whole Blood Glucose POCT: 129 mg/dL — AB (ref 70–100)
Whole Blood Glucose POCT: 76 mg/dL (ref 70–100)
Whole Blood Glucose POCT: 77 mg/dL (ref 70–100)
Whole Blood Glucose POCT: 92 mg/dL (ref 70–100)

## 2012-08-23 LAB — GFR: EGFR: >60

## 2012-08-23 NOTE — Progress Notes (Signed)
[facility] Attending Note    This patient was reviewed on rounds with Dr. Penne Lash.  Physical exam confirmed and duplicated.  I agree with assessment and plan as outlined.      Patient Active Problem List   Diagnosis   . DKA (diabetic ketoacidoses)   . GERD (gastroesophageal reflux disease)   . Gastroparesis   . Dehydration, moderate   . Gastritis without bleeding   . AKI (acute kidney injury)   . Type 2 diabetes, uncontrolled, with neuropathy   . Hypertension   . Nausea & vomiting   . Pneumonia   . Intractable vomiting   . Abdominal pain   . DM (diabetes mellitus)   . HTN (hypertension)   . UGI bleed   . Diabetic gastroparesis     Traci Sermon  [facility]  [phone] cell  [phone] office

## 2012-08-23 NOTE — Final Progress Note (DC Note for stay less than 48 (Signed)
FAMILY MEDICINE DISCHARGE NOTE    Date Time: 08/23/2012 12:26 PM  Patient Name: [name]  Attending Physician: Jillene Bucks, MD  Team Contact Info: [phone]       Assessment:     Active Hospital Problems    Diagnosis   . Diabetic gastroparesis   . Intractable vomiting   . DM (diabetes mellitus)   . HTN (hypertension)   . Gastroparesis   . GERD (gastroesophageal reflux disease)       37 y/o male with hx IDDM, HTN, recurrent admissions for diabetic gastroparesis p/w intractable vomiting      Plan:   #Diabetic gastroparesis with intractable vomiting; resolved    -cont zofran  -erythromycin 100 mg IV q8   -cont home carafate, PPI    - pt to see GI as an outpatient   -will need close outpatient f/u and food/stress logs to examine his triggers     #IDDM   -continue with home lantus 36 units nightly   -SSI   -last HA1c on 6/30 was 10.3   -accuchecks q4 until PO intake improves     #HTN   -cont home norvasc, clonidine, lisinopril     FEN/GI: d/c fluids and adv to regular diet   ppx - lovenox   Code - Full   Dispo - home today if tolerating reg diet       Subjective     Pt reports nausea and vomiting have completely resolved.  Is tolerating clears diet and is ambulating around unit.  Making adequate urine and reports loose stool which he attributes to liquid diet.  Denies any CP, SOB, abd pain, n/v, or f/c.  States he wants to go home.     Physical Exam:       VITAL SIGNS PHYSICAL EXAM   Temp:  [95.7 F (35.4 C)-97.8 F (36.6 C)] 97.1 F (36.2 C)  Heart Rate:  [71-87] 72   Resp Rate:  [18-19] 18   BP: (114-156)/(69-100) 156/100 mmHg         Intake/Output Summary (Last 24 hours) at 08/23/12 1226  Last data filed at 08/22/12 2300   Gross per 24 hour   Intake    250 ml   Output      2 ml   Net    248 ml    Physical Exam  Gen: NAD  HEENT: MMM  Cardiac: RRR, no m/r/g  Lungs: CTAB  Abdomen: NT, ND, S, BS hypoactive   Ext: no edema, 2+ peripheral pulses          Meds:        Medication List       As of  08/23/2012 12:27 PM      CONTINUE taking these medications           amLODIPine 5 MG tablet    Commonly known as: NORVASC    Take 1 tablet (5 mg total) by mouth daily.        cloNIDine 0.1 MG tablet    Commonly known as: CATAPRES    Take 1 tablet (0.1 mg total) by mouth every 12 (twelve) hours.        ERYTHROMYCIN BASE PO        insulin aspart 100 UNIT/ML injection    Commonly known as: NovoLOG    Inject 1-8 Units into the skin 4 times daily - with meals and at bedtime.        insulin glargine 100 UNIT/ML injection  Commonly known as: LANTUS    Inject 36 Units into the skin nightly.        lisinopril 20 MG tablet    Commonly known as: PRINIVIL,ZESTRIL        metoclopramide 10 MG tablet    Commonly known as: REGLAN    Take 1 tablet (10 mg total) by mouth every 8 (eight) hours.        ondansetron 4 MG disintegrating tablet    Commonly known as: ZOFRAN-ODT    Take 1 tablet (4 mg total) by mouth every 6 (six) hours as needed for Nausea.        ondansetron 8 MG tablet    Commonly known as: ZOFRAN    Take 1 tablet (8 mg total) by mouth every 8 (eight) hours as needed for Nausea.        oxyCODONE 5 MG immediate release tablet    Commonly known as: ROXICODONE    Take 1 tablet (5 mg total) by mouth every 8 (eight) hours as needed for Pain.        pantoprazole 40 MG tablet    Commonly known as: PROTONIX    Take 1 tablet (40 mg total) by mouth 2 (two) times daily.        sucralfate 1 G tablet    Commonly known as: CARAFATE    Take 1 tablet (1 g total) by mouth 3 (three) times daily.        traMADol 50 MG tablet    Commonly known as: ULTRAM    Take 1 tablet (50 mg total) by mouth every 6 (six) hours as needed.             Labs:       Labs (last 72 hours):      Lab 08/23/12 1100 08/21/12 2257   WBC 6.32 7.80   HGB 12.5* 13.2   HCT 39.0* 41.2*   PLT 347 344       No results found for this basename: PT:2,INR:2,PTT:2 in the last 168 hours   Lab 08/23/12 1100 08/22/12 1814   NA 140 140   K 4.0 4.1   CL 108* 107   CO2 28 25   BUN  6.0* 9.0   CREAT 1.0 1.1   CA 8.5 8.9   ALB -- --   PROT -- --   BILITOTAL -- --   ALKPHOS -- --   ALT -- --   AST -- --   GLU 125* 177*                   Follow up with PCP in 7 days       Case discussed with: Dr. Quintella Baton     Signed by: Esmond Camper, MD

## 2012-08-23 NOTE — Progress Notes - NICU (Signed)
A&O x3 MAE strongly. IV @125cc /hr. No N/V . Hyperactive BS. Scheduled zofran and phenergan q 8 hrs. Tolerating clear liquid diet will advance at lunch to consistent carb. Walks with steady gait. Will continue to monitor.

## 2012-08-23 NOTE — Progress Notes (Signed)
FAMILY MEDICINE PROGRESS NOTE    Date Time: 08/23/2012 8:22 AM  Patient Name: [name]  Attending Physician: Jillene Bucks, MD  Team Contact Info: [phone]       Assessment:     Active Hospital Problems    Diagnosis   . Diabetic gastroparesis   . Intractable vomiting   . DM (diabetes mellitus)   . HTN (hypertension)   . Gastroparesis   . GERD (gastroesophageal reflux disease)       37 y/o male with hx IDDM, HTN, recurrent admissions for diabetic gastroparesis p/w intractable vomiting      Plan:   #Diabetic gastroparesis with intractable vomiting; resolved    -cont zofran  -erythromycin 100 mg IV q8   -cont home carafate, PPI    - pt to see GI as an outpatient   -will need close outpatient f/u and food/stress logs to examine his triggers     #IDDM   -continue with home lantus 36 units nightly   -SSI   -last HA1c on 6/30 was 10.3   -accuchecks q4 until PO intake improves     #HTN   -cont home norvasc, clonidine, lisinopril     FEN/GI: d/c fluids and adv to regular diet   ppx - lovenox   Code - Full   Dispo - home today if tolerating reg diet       Subjective     Pt reports nausea and vomiting have completely resolved.  Is tolerating clears diet and is ambulating around unit.  Making adequate urine and reports loose stool which he attributes to liquid diet.  Denies any CP, SOB, abd pain, n/v, or f/c.  States he wants to go home.     Physical Exam:       VITAL SIGNS PHYSICAL EXAM   Temp:  [95.7 F (35.4 C)-97.8 F (36.6 C)] 97 F (36.1 C)  Heart Rate:  [71-98] 80   Resp Rate:  [16-19] 18   BP: (114-151)/(69-82) 138/74 mmHg         Intake/Output Summary (Last 24 hours) at 08/23/12 6578  Last data filed at 08/22/12 2300   Gross per 24 hour   Intake    250 ml   Output      2 ml   Net    248 ml    Physical Exam  Gen: NAD  HEENT: MMM  Cardiac: RRR, no m/r/g  Lungs: CTAB  Abdomen: NT, ND, S, BS hypoactive   Ext: no edema, 2+ peripheral pulses          Meds:     Medications were reviewed.    Labs:        Labs (last 72 hours):      Lab 08/21/12 2257   WBC 7.80   HGB 13.2   HCT 41.2*   PLT 344       No results found for this basename: PT:2,INR:2,PTT:2 in the last 168 hours   Lab 08/22/12 1814 08/21/12 2257   NA 140 139   K 4.1 5.2*   CL 107 104   CO2 25 25   BUN 9.0 11.0   CREAT 1.1 1.3   CA 8.9 9.1   ALB -- --   PROT -- --   BILITOTAL -- --   ALKPHOS -- --   ALT -- --   AST -- --   GLU 177* 285*  Case discussed with: Dr. Quintella Baton     Signed by: [name] Camper, MD

## 2012-08-23 NOTE — Progress Notes (Signed)
Patient able to tolerate regular ( consistent carb) diet well with no indigestion, Nausea or vomiting. Feels well. Cleared for discharge to home on bland diet with follow-up with Dr Alben Spittle PCP in 1 week. IV discontinued. Patient dressed and walked to exit. Discharge instructions reviewed with patient. There were no prescriptions given.

## 2012-08-23 NOTE — Discharge Instructions (Signed)
Gastroparesis  Gastroparesis (also called delayed gastric emptying) occurs when the stomach takes longer than normal to empty of food. This is due to a problem with motility (the movement of the muscles in the digestive tract). For many people, gastroparesis is a lifelong condition. But treatment can help relieve symptoms and prevent complications. Read on to learn more about gastroparesis and how it can be managed.  How Gastroparesis Develops     Gastroparesis means that food and fluids move too slowly out of the stomach into the duodenum.   With normal motility, signals from nerves tell the stomach muscles when to contract. These muscles move food from the stomach into the duodenum (the first part of the small bowel). With gastroparesis, the nerves or muscles are damaged. This causes motility to slow down or stop completely. As a result, food cannot move from the stomach properly. This delayed emptying can cause nausea, vomiting, and other symptoms. Malnutrition can result. Bezoars (hardened lumps of food) can form in the stomach and cause other complications as well.  Causes of Gastroparesis  Gastroparesis can be caused by any of the following:   Diabetes   Surgery involving any of the digestive organs, such as the stomach and bowels   Certain medications, such as strong pain medications (narcotics)   Certain conditions, such as systemic scleroderma, Parkinson's disease, and thyroid disease  In many cases, the cause of gastroparesis cannot be found.  Signs and Symptoms of Gastroparesis  These can include:   Nausea and vomiting   Feeling full quickly when eating   Abdominal pain   Heartburn   Abdominal bloating   Weight loss   Loss of appetite   High and low blood sugar levels (in people withdiabetes)  Diagnosing Gastroparesis  Your doctor will ask about your symptoms and health history. You'll also be examined. In addition, blood tests and X-rays are often done to check your health and rule out  other problems. To confirm the problem, you may need other tests as well. These can include:   Upper endoscopy.This is doneto see inside the stomach and duodenum. For the test, an endoscope is used. This is a thin, flexible tube with a tiny camera on the end. It's inserted through the mouth and down into the stomach and duodenum.   Upper gastrointestinal (GI) series.This is doneto take X-rays of the upper GI tract from the mouth to the small bowel. For the test, a substance called barium is used. The barium coats the upper GI tract so that it will show up clearly on X-rays.   Gastric emptying scan.This is done to measure how quickly food leaves the stomach. For the test, a meal containing a harmless radioactive substance (tracer) is eaten. Then scans of the stomach are done. The tracer shows up clearly on the scans and shows the movement of the food through the stomach.  Treating Gastroparesis  The goal of treatment is to help you manage your condition. Treatment may include one or more of the following:   Dietary changes.You may need to make changes to your eating habits and daily diet. For instance, your doctor may instruct you to eat small meals throughout the day. Doing this can keep you from feeling full too quickly. You may be placed on a liquid or "soft" diet. This means you'll eat liquid foods or foods that are mashed or put through a blender. In addition, you may need to avoid foods high in fats and fiber. These can slow digestion.   For more help with your diet, your doctor can refer you to a dietitian. In severe cases, you may need a feeding tube. This sends liquid food or medication directly to your small bowel, bypassing the stomach.   Medications.These can help manage symptoms, such as nausea and vomiting. They can also improve motility. Each medication has specific risks and side effects. Your doctor can tell you more about any medication that is prescribed for you.   Surgery.You may need  to have a tube surgically inserted into the stomach. The tube removes excess air and fluid. This can relieve severe symptoms of nausea and vomiting. In rare cases, other surgery may be needed on the stomach or small bowel. This is to create a new passageway for food to be emptied from the stomach.   Other treatments.These include botulin toxin injection and gastric electrical stimulation. They are done less often and may not be available. Your doctor can tell you more about these treatments if they are options for you.  Diabetes and Gastroparesis  If you have diabetes, gastroparesis can make it harder to manage your blood sugar level. You'll need to take extra steps in your treatment to prevent complications. Work with your doctor to learn what you can do to protect your health. For more information, contact the American Diabetes Association,www.diabetes.org.   Long-term Concerns  With treatment, most people can manage their symptoms and maintain their usual routines. If your symptoms are moderate to severe, you may need to see your doctor more frequently for checkups. Also, other treatments will likely be needed.

## 2012-10-05 ENCOUNTER — Emergency Department: Payer: BC Managed Care – PPO

## 2012-10-05 ENCOUNTER — Inpatient Hospital Stay: Payer: BC Managed Care – PPO | Admitting: Family Medicine

## 2012-10-05 ENCOUNTER — Inpatient Hospital Stay
Admission: EM | Admit: 2012-10-05 | Discharge: 2012-10-10 | DRG: 074 | Disposition: A | Discharge status: Home / Self Care | Payer: BC Managed Care – PPO | Attending: Family Medicine | Admitting: Family Medicine

## 2012-10-05 DIAGNOSIS — K3184 Gastroparesis: Secondary | ICD-10-CM | POA: Diagnosis present

## 2012-10-05 DIAGNOSIS — K5900 Constipation, unspecified: Secondary | ICD-10-CM | POA: Diagnosis present

## 2012-10-05 DIAGNOSIS — E1149 Type 2 diabetes mellitus with other diabetic neurological complication: Principal | ICD-10-CM | POA: Diagnosis present

## 2012-10-05 DIAGNOSIS — E860 Dehydration: Secondary | ICD-10-CM | POA: Diagnosis present

## 2012-10-05 DIAGNOSIS — Z9089 Acquired absence of other organs: Secondary | ICD-10-CM

## 2012-10-05 DIAGNOSIS — I10 Essential (primary) hypertension: Secondary | ICD-10-CM | POA: Diagnosis present

## 2012-10-05 DIAGNOSIS — K219 Gastro-esophageal reflux disease without esophagitis: Secondary | ICD-10-CM | POA: Diagnosis present

## 2012-10-05 DIAGNOSIS — E1142 Type 2 diabetes mellitus with diabetic polyneuropathy: Secondary | ICD-10-CM | POA: Diagnosis present

## 2012-10-05 DIAGNOSIS — Z794 Long term (current) use of insulin: Secondary | ICD-10-CM

## 2012-10-05 DIAGNOSIS — K449 Diaphragmatic hernia without obstruction or gangrene: Secondary | ICD-10-CM | POA: Diagnosis present

## 2012-10-05 LAB — URINALYSIS WITH MICROSCOPIC
Bilirubin, UA: NEGATIVE
Blood, UA: NEGATIVE
Glucose, UA: >1000 — AB
Leukocyte Esterase, UA: NEGATIVE
Nitrite, UA: NEGATIVE
Specific Gravity UA: 1.025 (ref 1.001–1.035)
Urine pH: 5.5 (ref 5.0–8.0)
Urobilinogen, UA: NORMAL mg/dL

## 2012-10-05 LAB — I-STAT CG4 VENOUS CARTRIDGE
Lactic Acid I-Stat: 3 — ABNORMAL HIGH (ref 0.5–2.2)
i-STAT Base Excess Venous: -1
i-STAT FIO2: 21
i-STAT HCO3 Bicarbonate Venous: 26.1
i-STAT O2 Saturation Venous: 64 %
i-STAT Patient Temperature: 98.7
i-STAT Total CO2 Venous: 28
i-STAT pCO2 Venous: 50.4
i-STAT pH Venous: 7.323
i-STAT pO2 Venous: 37

## 2012-10-05 LAB — POCT GLUCOSE
Whole Blood Glucose POCT: 249 mg/dL — AB (ref 70–100)
Whole Blood Glucose POCT: 283 mg/dL — AB (ref 70–100)
Whole Blood Glucose POCT: 281 mg/dL — AB (ref 70–100)

## 2012-10-05 LAB — CBC AND DIFFERENTIAL
Basophils Absolute Automated: 0.01 (ref 0.00–0.20)
Basophils Automated: 0 %
Eosinophils Absolute Automated: 0.01 (ref 0.00–0.70)
Eosinophils Automated: 0 %
Hematocrit: 40.5 % — ABNORMAL LOW (ref 42.0–52.0)
Hgb: 13.3 g/dL (ref 13.0–17.0)
Immature Granulocytes Absolute: 0.03
Immature Granulocytes: 0 %
Lymphocytes Absolute Automated: 0.99 (ref 0.50–4.40)
Lymphocytes Automated: 10 %
MCH: 26.8 pg — ABNORMAL LOW (ref 28.0–32.0)
MCHC: 32.8 g/dL (ref 32.0–36.0)
MCV: 81.5 fL (ref 80.0–100.0)
MPV: 9.8 fL (ref 9.4–12.3)
Monocytes Absolute Automated: 0.32 (ref 0.00–1.20)
Monocytes: 3 %
Neutrophils Absolute: 8.04 (ref 1.80–8.10)
Neutrophils: 86 %
Nucleated RBC: 0 (ref 0–1)
Platelets: 253 (ref 140–400)
RBC: 4.97 (ref 4.70–6.00)
RDW: 12 % (ref 12–15)
WBC: 9.4 (ref 3.50–10.80)

## 2012-10-05 LAB — LIPASE: Lipase: 43 U/L (ref 8–78)

## 2012-10-05 LAB — COMPREHENSIVE METABOLIC PANEL
ALT: 15 U/L (ref 0–55)
AST (SGOT): 13 U/L (ref 5–34)
Albumin/Globulin Ratio: 1.3 (ref 0.9–2.2)
Albumin: 4.1 g/dL (ref 3.5–5.0)
Alkaline Phosphatase: 97 U/L (ref 40–150)
BUN: 16 mg/dL (ref 9.0–21.0)
Bilirubin, Total: 0.7 mg/dL (ref 0.2–1.2)
CO2: 26 (ref 22–29)
Calcium: 9.4 mg/dL (ref 8.5–10.5)
Chloride: 104 (ref 98–107)
Creatinine: 1.4 mg/dL — ABNORMAL HIGH (ref 0.7–1.3)
Globulin: 3.2 g/dL (ref 2.0–3.6)
Glucose: 309 mg/dL — ABNORMAL HIGH (ref 70–100)
Potassium: 4.8 (ref 3.5–5.1)
Protein, Total: 7.3 g/dL (ref 6.0–8.3)
Sodium: 139 (ref 136–145)

## 2012-10-05 LAB — GFR: EGFR: >60

## 2012-10-05 LAB — ACETONE: Acetone Blood: NEGATIVE

## 2012-10-05 MED ORDER — PROMETHAZINE HCL 25 MG/ML IJ SOLN
12.5000 mg | Freq: Once | INTRAMUSCULAR | Status: AC
Start: 2012-10-05 — End: 2012-10-05

## 2012-10-05 MED ORDER — HEPARIN SODIUM (PORCINE) 5000 UNIT/ML IJ SOLN
5000.0000 [IU] | Freq: Three times a day (TID) | INTRAMUSCULAR | Status: DC
Start: 2012-10-06 — End: 2012-10-10
  Administered 2012-10-06 – 2012-10-10 (×14): 5000 [IU] via SUBCUTANEOUS
  Filled 2012-10-05 (×15): qty 1

## 2012-10-05 MED ORDER — SUCRALFATE 1 G PO TABS
1.0000 g | ORAL_TABLET | Freq: Three times a day (TID) | ORAL | Status: DC
Start: 2012-10-06 — End: 2012-10-07
  Administered 2012-10-06 – 2012-10-07 (×4): 1 g via ORAL
  Filled 2012-10-05 (×4): qty 1

## 2012-10-05 MED ORDER — CLONIDINE HCL 0.1 MG PO TABS
0.1000 mg | ORAL_TABLET | Freq: Two times a day (BID) | ORAL | Status: DC
Start: 2012-10-05 — End: 2012-10-10
  Administered 2012-10-06 – 2012-10-10 (×8): 0.1 mg via ORAL
  Filled 2012-10-05 (×9): qty 1

## 2012-10-05 MED ORDER — INSULIN ASPART 100 UNIT/ML SC SOLN
1.0000 [IU] | SUBCUTANEOUS | Status: DC | PRN
Start: 2012-10-05 — End: 2012-10-10
  Administered 2012-10-06 – 2012-10-10 (×10): 1 [IU] via SUBCUTANEOUS
  Administered 2012-10-10: 3 [IU] via SUBCUTANEOUS
  Administered 2012-10-10: 1 [IU] via SUBCUTANEOUS
  Filled 2012-10-05 (×10): qty 10
  Filled 2012-10-05: qty 30
  Filled 2012-10-05: qty 10

## 2012-10-05 MED ORDER — AMLODIPINE BESYLATE 5 MG PO TABS
5.0000 mg | ORAL_TABLET | Freq: Every day | ORAL | Status: DC
Start: 2012-10-06 — End: 2012-10-09
  Administered 2012-10-06 – 2012-10-08 (×2): 5 mg via ORAL
  Filled 2012-10-05 (×3): qty 1

## 2012-10-05 MED ORDER — INSULIN REGULAR HUMAN 100 UNIT/ML IJ SOLN
12.0000 [IU] | Freq: Once | INTRAMUSCULAR | Status: AC
Start: 2012-10-05 — End: 2012-10-05
  Administered 2012-10-05: 12 [IU] via INTRAVENOUS
  Filled 2012-10-05: qty 120

## 2012-10-05 MED ORDER — PROMETHAZINE HCL 25 MG/ML IJ SOLN
12.5000 mg | Freq: Once | INTRAMUSCULAR | Status: AC
Start: 2012-10-05 — End: 2012-10-05
  Administered 2012-10-05: 12.5 mg via INTRAVENOUS
  Filled 2012-10-05: qty 1

## 2012-10-05 MED ORDER — ONDANSETRON 4 MG PO TBDP
ORAL_TABLET | ORAL | Status: AC
Start: 2012-10-05 — End: 2012-10-05
  Administered 2012-10-05: 4 mg
  Filled 2012-10-05: qty 1

## 2012-10-05 MED ORDER — HYDROMORPHONE HCL PF 1 MG/ML IJ SOLN
1.00 mg | Freq: Once | INTRAMUSCULAR | Status: AC
Start: 2012-10-05 — End: 2012-10-05
  Administered 2012-10-05: 1 mg via INTRAVENOUS
  Filled 2012-10-05: qty 1

## 2012-10-05 MED ORDER — HYDROMORPHONE HCL PF 1 MG/ML IJ SOLN
1.0000 mg | Freq: Once | INTRAMUSCULAR | Status: AC
Start: 2012-10-05 — End: 2012-10-05
  Administered 2012-10-05: 1 mg via INTRAVENOUS
  Filled 2012-10-05: qty 1

## 2012-10-05 MED ORDER — ONDANSETRON HCL 4 MG/2ML IJ SOLN
4.00 mg | Freq: Once | INTRAMUSCULAR | Status: AC
Start: 2012-10-05 — End: 2012-10-05
  Administered 2012-10-05: 4 mg via INTRAVENOUS
  Filled 2012-10-05: qty 2

## 2012-10-05 MED ORDER — DEXTROSE 50 % IV SOLN
25.0000 mL | INTRAVENOUS | Status: DC | PRN
Start: 2012-10-05 — End: 2012-10-10

## 2012-10-05 MED ORDER — SODIUM CHLORIDE 0.9 % IV BOLUS
1000.0000 mL | Freq: Once | INTRAVENOUS | Status: AC
Start: 2012-10-05 — End: 2012-10-05
  Administered 2012-10-05: 1000 mL via INTRAVENOUS

## 2012-10-05 MED ORDER — METOCLOPRAMIDE HCL 5 MG/ML IJ SOLN
10.0000 mg | Freq: Once | INTRAMUSCULAR | Status: AC
Start: 2012-10-05 — End: 2012-10-05
  Administered 2012-10-05: 10 mg via INTRAVENOUS
  Filled 2012-10-05: qty 2

## 2012-10-05 MED ORDER — PANTOPRAZOLE SODIUM 40 MG IV SOLR
40.0000 mg | Freq: Every day | INTRAVENOUS | Status: DC
Start: 2012-10-06 — End: 2012-10-07
  Administered 2012-10-05 – 2012-10-07 (×3): 40 mg via INTRAVENOUS
  Filled 2012-10-05 (×3): qty 40

## 2012-10-05 MED ORDER — ONDANSETRON HCL 4 MG/2ML IJ SOLN
4.0000 mg | Freq: Once | INTRAMUSCULAR | Status: AC
Start: 2012-10-05 — End: 2012-10-05
  Administered 2012-10-05: 4 mg via INTRAVENOUS
  Filled 2012-10-05: qty 2

## 2012-10-05 MED ORDER — METOCLOPRAMIDE HCL 5 MG/ML IJ SOLN
5.0000 mg | Freq: Four times a day (QID) | INTRAMUSCULAR | Status: DC
Start: 2012-10-06 — End: 2012-10-08
  Administered 2012-10-05 – 2012-10-08 (×10): 5 mg via INTRAVENOUS
  Filled 2012-10-05 (×10): qty 2

## 2012-10-05 MED ORDER — GLUCOSE 40 % PO GEL
15.0000 g | ORAL | Status: DC | PRN
Start: 2012-10-05 — End: 2012-10-10

## 2012-10-05 MED ORDER — PROMETHAZINE HCL 25 MG/ML IJ SOLN
INTRAMUSCULAR | Status: AC
Start: 2012-10-05 — End: 2012-10-05
  Administered 2012-10-05: 12.5 mg via INTRAVENOUS
  Filled 2012-10-05: qty 1

## 2012-10-05 MED ORDER — HYDROMORPHONE HCL PF 1 MG/ML IJ SOLN
1.0000 mg | INTRAMUSCULAR | Status: DC | PRN
Start: 2012-10-05 — End: 2012-10-06
  Administered 2012-10-05 – 2012-10-06 (×3): 1 mg via INTRAVENOUS
  Filled 2012-10-05 (×3): qty 1

## 2012-10-05 MED ORDER — ACETAMINOPHEN 325 MG PO TABS
650.0000 mg | ORAL_TABLET | ORAL | Status: DC | PRN
Start: 2012-10-05 — End: 2012-10-10

## 2012-10-05 MED ORDER — INSULIN GLARGINE 100 UNIT/ML SC SOLN
18.0000 [IU] | Freq: Every evening | SUBCUTANEOUS | Status: DC
Start: 2012-10-06 — End: 2012-10-07
  Administered 2012-10-06 (×2): 18 [IU] via SUBCUTANEOUS
  Filled 2012-10-05 (×2): qty 180

## 2012-10-05 NOTE — Progress Notes (Signed)
Pt seen and examined with Dr. Si Gaul and agree with H&P as documented.     37 yo M with PMH IDDM (last hgb A1C 10.3 - 08/11/2012) biliary dyskinesia s/p cholecystectomy in 2012, and multiple recent hospital admissions for diabetic gastroparesis.  Had EGD on 6/27 that showed gastritis.  GI had recommended psych eval.  He also has a reported h/o noncompliance with GI medications as outpatient.  Previously followed by Dr. Jens Som (GI) at [facility] and had also previously been seen by by Dr. Selena Batten when at [facility].  Pt presents to ED with intractable NBNB emesis that started this AM - reports having 20 episodes of vomiting.  Also c/o epigastric pain    Plan:     Intractable nausea - likely due to gastroparesis; KUB without obstruction  - IVF  - antiemetics  - NPO until nausea improves  - consider GI consult in AM if not improved  - care coordinator if not involved already  - hold off on lisinopril given AKI (Cr 1.4; baseline 1.0)  - lantus and ISS    Troy Sine

## 2012-10-05 NOTE — ED Notes (Signed)
MD notified, PIV infiltrated, assisting RN at bedside to attempt 2nd PIV via [name].

## 2012-10-05 NOTE — ED Notes (Signed)
Unable to establish PIV access after three (total) attempts. Next attempt will be by sono-site.

## 2012-10-05 NOTE — H&P (Signed)
ADMISSION HISTORY AND PHYSICAL EXAM    Date Time: 10/05/2012 10:36 PM  Patient Name: [name]  Attending Physician: Gerald Dexter, MD  Primary Care Physician: [name], MD    CC: nausea and vomiting      History of Presenting Illness:   [name] is a 37 y.o. male PMHx significant for HTN, DM2, neuropathy and gastroparesis admitted for intractable n/v, dehydration x 1 day. Reports n/v began this am. Felt as though he "was not digesting." Has had 15-20 episodes non-bloody non-bilious emesis. Has not been able to eat anything since yesterday evening. Associated symptoms include intermittent sweats and fatigue. Has hx of gastroparesis however has not taken any of his PRN meds recently until today. Did not offer any relief. Denies any recent fevers, cough, cold, congestion, cp, diarrhea, dysuria. No sick contacts. Does not have endocrinologist or GI doctor.    Reports he has been taking his meds regularly (including insulin) up until today. Has also been checking BG levels, and has been ranging low 100s fasting and 200s after meals. Denies any inc thirst, although does report inc freq of urination recently. Has had DKA in the past, denies prior association with similar symptoms. HgbA1C 10.3 on 08/11/2012.    In ED pt noted to have AKI (Cr 1.4, baseline 1.1) and hyperglycemia (300's). LFTs, lipase wnl. UA w/trace proteins, >1000 glucose, trace ketones. HCO3 26. AG 12. Blood acetone was negative. Abd Xray without evidence of ileus or obstruction. No evidence of infection on exam or lab findings. While in ED was given 2 x 1L bolus NS for hydration, dilaudid x 2 for pain,  reglan x1, zofran x 2, phenergan x 2 for nausea and vomiting with little relief.    PCP: Dr. Lyda Perone     Past Medical History:     Past Medical History   Diagnosis Date   . Diabetes    . Hypertensive disorder    . Gastroparesis        Available old records reviewed, including:  Prior hospitalizations, imaging, labs, office  visits.    Past Surgical History:     Past Surgical History   Procedure Date   . Cholecystectomy    . Knee arthroscopy w/ acl reconstruction      left   . Egd 08/08/2012     Procedure: EGD;  Surgeon: [name], MD;  Location[facility];  Service: Gastroenterology;  Laterality: N/A;  w\bx       Family History:     Family History   Problem Relation Age of Onset   . Diabetes Father    . Diabetes Maternal Grandmother    . Diabetes Paternal Grandmother        Social History:     History     Social History   . Marital Status: Single     Spouse Name: N/A     Number of Children: N/A   . Years of Education: N/A     Social History Main Topics   . Smoking status: Never Smoker    . Smokeless tobacco: Never Used   . Alcohol Use: Yes      Comment: socially, once a week maybe   . Drug Use: No   . Sexually Active: Not on file     Other Topics Concern   . Not on file     Social History Narrative   . No narrative on file  Allergies:   No Known Allergies    Medications:     Prior to Admission medications    Medication Sig Start Date End Date Taking? Authorizing Provider   amLODIPine (NORVASC) 5 MG tablet Take 1 tablet (5 mg total) by mouth daily. 08/10/12   Anselmo Pickler, MD   cloNIDine (CATAPRES) 0.1 MG tablet Take 1 tablet (0.1 mg total) by mouth every 12 (twelve) hours. 08/10/12   Anselmo Pickler, MD   ERYTHROMYCIN BASE PO Take by mouth.    [provider]   insulin aspart (NOVOLOG) 100 UNIT/ML injection Inject 1-8 Units into the skin 4 times daily - with meals and at bedtime. 06/25/12   Anselmo Pickler, MD   insulin glargine (LANTUS) 100 UNIT/ML injection Inject 36 Units into the skin nightly. 06/25/12   Anselmo Pickler, MD   lisinopril (PRINIVIL,ZESTRIL) 20 MG tablet Take 20 mg by mouth daily.    [provider]   metoclopramide (REGLAN) 10 MG tablet Take 1 tablet (10 mg total) by mouth every 8 (eight) hours. 08/12/12   Judson Roch, MD   ondansetron (ZOFRAN ODT) 4 MG  disintegrating tablet Take 1 tablet (4 mg total) by mouth every 6 (six) hours as needed for Nausea. 08/04/12   Latanya Presser, MD   ondansetron (ZOFRAN) 8 MG tablet Take 1 tablet (8 mg total) by mouth every 8 (eight) hours as needed for Nausea. 06/30/12   Arvilla Meres, MD   oxyCODONE (ROXICODONE) 5 MG immediate release tablet Take 1 tablet (5 mg total) by mouth every 8 (eight) hours as needed for Pain. 08/10/12   Anselmo Pickler, MD   pantoprazole (PROTONIX) 40 MG tablet Take 1 tablet (40 mg total) by mouth 2 (two) times daily. 08/10/12   Anselmo Pickler, MD   sucralfate (CARAFATE) 1 G tablet Take 1 tablet (1 g total) by mouth 3 (three) times daily. 08/10/12   Anselmo Pickler, MD   traMADol (ULTRAM) 50 MG tablet Take 1 tablet (50 mg total) by mouth every 6 (six) hours as needed. 08/10/12   Anselmo Pickler, MD       Review of Systems:   All other systems were reviewed and are negative except: those listed in HPI    Physical Exam:     Patient Vitals for the past 24 hrs:   BP Temp Pulse Resp SpO2   10/05/12 2207 176/82 mmHg - 102  - 98 %   10/05/12 2100 177/96 mmHg - 98  - 99 %   10/05/12 2030 177/97 mmHg - 94  - 99 %   10/05/12 2001 - - 96  - 99 %   10/05/12 2000 169/96 mmHg - - - -   10/05/12 1733 158/92 mmHg 96.1 F (35.6 C) 125  22  100 %     There is no height or weight on file to calculate BMI.  No intake or output data in the 24 hours ending 10/05/12 2236    General: awake, alert, oriented x 3; no acute distress.  HEENT: perrla, eomi, sclera anicteric, oropharynx clear without lesions, mucous membranes dry  Neck: supple, no lymphadenopathy, no thyromegaly, no JVD, no carotid bruits  Cardiovascular: regular rate and rhythm, no murmurs, rubs or gallops  Lungs: clear to auscultation bilaterally, without wheezing, rhonchi, or rales  Abdomen: soft, +epigastric tenderness to palpation, non-distended; no palpable masses, no hepatosplenomegaly, normoactive bowel sounds, no rebound or guarding  Extremities: no  clubbing, cyanosis, or edema  Neuro: cranial nerves grossly intact, strength 5/5 in upper and lower extremities, dec sensation of feet  Skin: no rashes or lesions noted    Labs:     Results     Procedure Component Value Units Date/Time    i-Stat CG4 Venous CartrIDge [[id number]]  (Abnormal) Collected:10/05/12 2215     i-STAT pH Venous 7.323 Updated:10/05/12 2223     i-STAT pCO2 Venous 50.4      i-STAT pO2 Venous 37.0      i-STAT HCO3 Bicarbonate Venous 26.1      i-STAT Total CO2 Venous 28.0      i-STAT Base Excess Venous -1.0      i-STAT O2 Saturation Venous 64.0 %      i-STAT Lactic acid 3.0 (H)      i-STAT Patient Temperature 98.7      i-STAT FIO2 21      i-STAT Allen's Test NA      i-STAT Draw Site Venous     Accucheck [[id number]]  (Abnormal) Collected:10/05/12 2130     POCT Glucose WB 283 (A) mg/dL NWGNFAO:13/08/65 7846    UA with Micro [[id number]]  (Abnormal) Collected:10/05/12 1954    Specimen Information:Urine Updated:10/05/12 2020     Urine Type Clean Catch      Color, UA Yellow      Clarity, UA Clear      Specific Gravity UA 1.025      Urine pH 5.5      Leukocyte Esterase, UA Negative      Nitrite, UA Negative      Protein, UA Trace (A)      Glucose, UA >1000 (A)      Ketones UA Trace (A)      Urobilinogen, UA Normal mg/dL      Bilirubin, UA Negative      Blood, UA Negative      Squamous Epithelial Cells, Urine 0 - 5     Acetone [[id number]] Collected:10/05/12 1927    Specimen Information:Blood Updated:10/05/12 2017     Acetone Blood Negative     Comprehensive Metabolic Panel (CMP) [[id number]]  (Abnormal) Collected:10/05/12 1927    Specimen Information:Blood Updated:10/05/12 1954     Glucose 309 (H) mg/dL      BUN 47.4 mg/dL      Creatinine 1.4 (H) mg/dL      Sodium 259      Potassium 4.8      Chloride 104      CO2 26      CALCIUM 9.4 mg/dL      Protein, Total 7.3 g/dL      Albumin 4.1 g/dL      AST (SGOT) 13 U/L      ALT 15 U/L      Alkaline Phosphatase 97 U/L      Bilirubin, Total 0.7 mg/dL      Globulin  3.2 g/dL      Albumin/Globulin Ratio 1.3     Lipase [[id number]] Collected:10/05/12 1927    Specimen Information:Blood Updated:10/05/12 1954     Lipase 43 U/L     GFR [[id number]] Collected:10/05/12 1927     EGFR >60.0 Updated:10/05/12 1954    CBC with Differential [[id number]]  (Abnormal) Collected:10/05/12 1927    Specimen Information:Blood / Blood Updated:10/05/12 1940     WBC 9.40      RBC 4.97      Hgb 13.3 g/dL      Hematocrit 10.9 (L) %      MCV 81.5 fL  MCH 26.8 (L) pg      MCHC 32.8 g/dL      RDW 12 %      Platelets 253      MPV 9.8 fL      Neutrophils 86 %      Lymphocytes Automated 10 %      Monocytes 3 %      Eosinophils Automated 0 %      Basophils Automated 0 %      Immature Granulocyte 0 %      Nucleated RBC 0      Neutrophils Absolute 8.04      Abs Lymph Automated 0.99      Abs Mono Automated 0.32      Abs Eos Automated 0.01      Absolute Baso Automated 0.01      Absolute Immature Granulocyte 0.03     Accucheck [[id number]]  (Abnormal) Collected:10/05/12 1744     POCT Glucose WB 249 (A) mg/dL [id number]:14/78/29 5621          Imaging personally reviewed, including:   10/05/2012 Chest and Abdominal XRAY:  FINDINGS: A PA view of the chest was performed. Cardiomediastinal   silhouette and hilar regions are normal. No effusions or focal   infiltrates are visualized. The lungs are clear. The pulmonary vascular   pattern is normal.   Flat and upright views of the abdomen were performed. There are surgical   clips within the right upper quadrant consistent with a prior   cholecystectomy. There are no dilated loops of large or small bowel.   There is a possibility of small bowel gas. No significant air-fluid   levels are identified. There is no evidence of free intraperitoneal air.   There is stool throughout the near entirety of the colon.   IMPRESSION:   1. No acute cardiopulmonary disease.   2. No ileus or obstruction.        Assessment:     Patient Active Problem List    Diagnosis Date Noted   . Diabetic  gastroparesis 08/22/2012   . Intractable vomiting 08/05/2012   . Abdominal pain 08/05/2012   . DM (diabetes mellitus) 08/05/2012   . HTN (hypertension) 08/05/2012   . Type 2 diabetes, uncontrolled, with neuropathy 06/27/2012   . Hypertension 06/27/2012   . Nausea & vomiting 06/27/2012   . GERD (gastroesophageal reflux disease) 06/17/2012   . Gastroparesis 06/17/2012   . Dehydration, moderate 06/17/2012   . Gastritis without bleeding 06/17/2012     37 y/o PMHx significant for HTN, DM2, neuropathy and gastroparesis admitted for intractable n/v, dehydration, mild DKA, and AKI.    Plan:   #intractable nausea/vomiting/abd pain - likely 2/2 gastroparesis +/- mild DKA (BG >300, ketones in urine, AG of 12); obstruction from prior surgeries less likely given negative imaging  - admit to floor; no indication for ICU admission at this time (alert, no hemodynamic instability, respiratory insufficiency or severe acidosis present)  - abd xray no ileus or obstruction, demonstrates constipation  - pancreatitis unlikely given lipase wnl, unlikely gallbladder related given LFTs wnl and hx of chole 2012, electrolytes wnl  - aggressive IVF hydration w/NS  - scheduled reglan and zofran and carafate  - dilaudid PRN for pain   - consider enema if constipation persists     #HTN - BPs elevated  - continue home amlodipine 5mg  daily  - continue home clonidine 0.1 mg BID  - hold home lisinopril in setting of AKI    #Hyperglycemia vs.  Mild DKA - BG elevated 300s, ketones in urine, AG of 12;  repeat glucose 200's s/p IVF  - s/p 2L NS and 12u insulin in ED  - No evidence of infectious etiology - no dysuria, no diarrhea, UA neg, no fever, no leukocytosis, no tachycardia or tachypnea  - check HgbA1C, urine microalbumin, lipid panel  - will hold off on insulin gtt for now given BG already in 200s w/out insulin  - will give 1/2 of home dose lantus and cover with medium dose SSI  - check BMP q4h, monitor AG and K levels     #AKI - likely 2/2  hypoperfusion in setting of dehydration  - s/p 2L NS in ED  - cont IVF hydration  - avoid nephrotoxic agents  - monitor with serial BMP checks    #FEN/GI  - cont IVF hydration  - NPO for now  - replete lytes as indicated    #ppx  - DVT heparin  - GI protonix    #dispo - pending resolution of hyperglycemia and n/v, tolerating PO diet, recommend initiation of statin     Status/Rationale:  FULL CODE    Signed by: Inocente Salles, MD   RU:[id number], Vania Rea, MD

## 2012-10-05 NOTE — ED Notes (Signed)
Actively vomiting in triage, hx of gastroparesis and pt reports multiple admissions since then. Profusely sweating, +upper abd pain. Pt took insulin today and has been vomiting all day.

## 2012-10-05 NOTE — ED Provider Notes (Signed)
Physician/Midlevel provider first contact with patient: 10/05/12 1749         History     Chief Complaint   Patient presents with   . Emesis   . Abdominal Pain     HPI Comments: [name] is a 37 y.o. male h/o gastroparesis who presents to the ED with NBNB vomiting and epigastric pain that has been constant since he woke up this morning. +Diapohoresis and dizziness. Tried to take nausea medication, but threw it up. Notes it is similar to past episode a/w gastroparesis. No modifying factors. No diarrhea.     Patient is a 37 y.o. male presenting with vomiting and abdominal pain. The history is provided by the patient.   Emesis   Associated symptoms include abdominal pain. Pertinent negatives include no chills, no cough, no diarrhea, no fever and no headaches.   Abdominal Pain  The primary symptoms of the illness include abdominal pain and vomiting. The primary symptoms of the illness do not include fever, shortness of breath, nausea, diarrhea or dysuria.   Additional symptoms associated with the illness include diaphoresis. Symptoms associated with the illness do not include chills, hematuria, frequency or back pain.       Past Medical History   Diagnosis Date   . Diabetes    . Hypertensive disorder    . Gastroparesis        Past Surgical History   Procedure Date   . Cholecystectomy    . Knee arthroscopy w/ acl reconstruction      left   . Egd 08/08/2012     Procedure: EGD;  Surgeon: [name], MD;  Location: [facility];  Service: Gastroenterology;  Laterality: N/A;  w\bx       Family History   Problem Relation Age of Onset   . Diabetes Father    . Diabetes Maternal Grandmother    . Diabetes Paternal Grandmother        Social  History   Substance Use Topics   . Smoking status: Never Smoker    . Smokeless tobacco: Never Used   . Alcohol Use: Yes      Comment: socially, once a week maybe       .     No Known Allergies    Current/Home Medications    AMLODIPINE (NORVASC) 5 MG TABLET    Take 1  tablet (5 mg total) by mouth daily.    CLONIDINE (CATAPRES) 0.1 MG TABLET    Take 1 tablet (0.1 mg total) by mouth every 12 (twelve) hours.    PANTOPRAZOLE (PROTONIX) 40 MG TABLET    Take 1 tablet (40 mg total) by mouth 2 (two) times daily.    SUCRALFATE (CARAFATE) 1 G TABLET    Take 1 tablet (1 g total) by mouth 3 (three) times daily.        Review of Systems   Constitutional: Positive for diaphoresis. Negative for fever and chills.   HENT: Positive for rhinorrhea. Negative for hearing loss, congestion, sore throat and neck pain.    Eyes: Negative for visual disturbance.   Respiratory: Negative for cough and shortness of breath.    Cardiovascular: Negative for chest pain.   Gastrointestinal: Positive for vomiting and abdominal pain. Negative for nausea, diarrhea and blood in stool.   Genitourinary: Negative for dysuria, frequency and hematuria.   Musculoskeletal: Negative for back pain.   Skin: Negative for color change and rash.   Neurological: Positive for dizziness. Negative for weakness, light-headedness,  numbness and headaches.   All other systems reviewed and are negative.        Physical Exam    BP 160/81  Pulse 109  Temp 97.4 F (36.3 C)  Resp 18  SpO2 93%    Physical Exam   Nursing note and vitals reviewed.  Constitutional: He is oriented to person, place, and time. He appears well-developed and well-nourished. No distress.   HENT:   Head: Normocephalic and atraumatic.   Nose: Nose normal.   Eyes: Conjunctivae normal and EOM are normal. Pupils are equal, round, and reactive to light. Right eye exhibits no discharge. Left eye exhibits no discharge.   Neck: Neck supple. No JVD present.   Cardiovascular: Normal rate, regular rhythm, normal heart sounds and intact distal pulses.  Exam reveals no gallop and no friction rub.    No murmur heard.       Non tachy on exam   Pulmonary/Chest: Effort normal and breath sounds normal. No respiratory distress.   Abdominal: Soft. Bowel sounds are normal. There is  tenderness. There is no rebound, no guarding and no CVA tenderness.        TTP of the epigastric region, no rebound, no guarding, no masses   Musculoskeletal: Normal range of motion. He exhibits no edema and no tenderness.   Lymphadenopathy:     He has no cervical adenopathy.   Neurological: He is alert and oriented to person, place, and time.   Skin: Skin is warm and dry. No rash noted. He is not diaphoretic.   Psychiatric: He has a normal mood and affect. Judgment normal.       MDM and ED Course     ED Medication Orders      Start     Status Ordering Provider    10/05/12 2236   insulin regular (HumuLIN R,NovoLIN R) injection 12 Units   Once      Route: Intravenous  Ordered Dose: 12 Units         Last MAR action:  Given Haydon Dorris J III    10/05/12 2236   sodium chloride 0.9 % bolus 1,000 mL   Once      Route: Intravenous  Ordered Dose: 1,000 mL         Last MAR action:  New Bag Fateh Kindle J III    10/05/12 2220   sodium chloride 0.9 % bolus 1,000 mL   Once      Route: Intravenous  Ordered Dose: 1,000 mL         Last MAR action:  New Bag Ronald Lobo J III    10/05/12 2122   ondansetron (ZOFRAN) injection 4 mg   Once      Route: Intravenous  Ordered Dose: 4 mg         Last MAR action:  Given Ayan Heffington J III    10/05/12 2122   HYDROmorphone (DILAUDID) injection 1 mg   Once      Route: Intravenous  Ordered Dose: 1 mg         Last MAR action:  Given Laurianne Floresca J III    10/05/12 2051   promethazine (PHENERGAN) injection 12.5 mg   Once      Route: Intravenous  Ordered Dose: 12.5 mg         Last MAR action:  Given Latiya Navia J III    10/05/12 2005   HYDROmorphone (DILAUDID) injection 1 mg   Once      Route:  Intravenous  Ordered Dose: 1 mg         Last MAR action:  Given Gisel Vipond J III    10/05/12 2005   metoclopramide (REGLAN) injection 10 mg   Once      Route: Intravenous  Ordered Dose: 10 mg         Last MAR action:  Given Gerlene Fee III    10/05/12 1927   promethazine  (PHENERGAN) injection 12.5 mg   Once      Route: Intravenous  Ordered Dose: 12.5 mg         Last MAR action:  Given Rip Hawes J III    10/05/12 1756   ondansetron (ZOFRAN-ODT) 4 MG disintegrating tablet      Comments: Created by cabinet override        Last MAR action:  Given     10/05/12 1737   sodium chloride 0.9 % bolus 1,000 mL   Once      Route: Intravenous  Ordered Dose: 1,000 mL         Last MAR action:  Stopped KOHILAS, KONSTANTINOS T    10/05/12 1737   ondansetron (ZOFRAN) injection 4 mg   Once      Route: Intravenous  Ordered Dose: 4 mg         Last MAR action:  Given KOHILAS, KONSTANTINOS T                 MDM  Number of Diagnoses or Management Options  Gastroparesis: new and requires workup  Diagnosis management comments: First MD: Ronald Lobo, MD    Pulse oximetry on room air is 100%.    DDx: gastroparesis, pancreatitis, gastritis, patient no longer has GB, obx    Results     Procedure Component Value Units Date/Time    Accucheck ([id number])  (Abnormal) Collected:10/05/12 2235     POCT Glucose WB 281 (A) mg/dL JYNWGNF:62/13/08 6578    i-Stat CG4 Venous CartrIDge ([id number])  (Abnormal) Collected:10/05/12   2215     i-STAT pH Venous 7.323 Updated:10/05/12 2223     i-STAT pCO2 Venous 50.4      i-STAT pO2 Venous 37.0      i-STAT HCO3 Bicarbonate Venous 26.1      i-STAT Total CO2 Venous 28.0      i-STAT Base Excess Venous -1.0      i-STAT O2 Saturation Venous 64.0 %      i-STAT Lactic acid 3.0 (H)      i-STAT Patient Temperature 98.7      i-STAT FIO2 21      i-STAT Allen's Test NA      i-STAT Draw Site Venous     Accucheck ([id number])  (Abnormal) Collected:10/05/12 2130     POCT Glucose WB 283 (A) mg/dL UVOZDGU:44/03/47 4259    UA with Micro ([id number])  (Abnormal) Collected:10/05/12 1954    Specimen Information:Urine Updated:10/05/12 2020     Urine Type Clean Catch      Color, UA Yellow      Clarity, UA Clear      Specific Gravity UA 1.025      Urine pH 5.5      Leukocyte Esterase, UA Negative       Nitrite, UA Negative      Protein, UA Trace (A)      Glucose, UA >1000 (A)      Ketones UA Trace (A)      Urobilinogen, UA Normal mg/dL      Bilirubin,  UA Negative      Blood, UA Negative      Squamous Epithelial Cells, Urine 0 - 5     Acetone ([id number]) Collected:10/05/12 1927    Specimen Information:Blood Updated:10/05/12 2017     Acetone Blood Negative     Comprehensive Metabolic Panel (CMP) ([id number])  (Abnormal)   Collected:10/05/12 1927    Specimen Information:Blood Updated:10/05/12 1954     Glucose 309 (H) mg/dL      BUN 03.4 mg/dL      Creatinine 1.4 (H) mg/dL      Sodium 742      Potassium 4.8      Chloride 104      CO2 26      CALCIUM 9.4 mg/dL      Protein, Total 7.3 g/dL      Albumin 4.1 g/dL      AST (SGOT) 13 U/L      ALT 15 U/L      Alkaline Phosphatase 97 U/L      Bilirubin, Total 0.7 mg/dL      Globulin 3.2 g/dL      Albumin/Globulin Ratio 1.3     Lipase ([id number]) Collected:10/05/12 1927    Specimen Information:Blood Updated:10/05/12 1954     Lipase 43 U/L     GFR ([id number]) Collected:10/05/12 1927     EGFR >60.0 Updated:10/05/12 1954    CBC with Differential ([id number])  (Abnormal) Collected:10/05/12 1927    Specimen Information:Blood / Blood Updated:10/05/12 1940     WBC 9.40      RBC 4.97      Hgb 13.3 g/dL      Hematocrit 60.1 (L) %      MCV 81.5 fL      MCH 26.8 (L) pg      MCHC 32.8 g/dL      RDW 12 %      Platelets 253      MPV 9.8 fL      Neutrophils 86 %      Lymphocytes Automated 10 %      Monocytes 3 %      Eosinophils Automated 0 %      Basophils Automated 0 %      Immature Granulocyte 0 %      Nucleated RBC 0      Neutrophils Absolute 8.04      Abs Lymph Automated 0.99      Abs Mono Automated 0.32      Abs Eos Automated 0.01      Absolute Baso Automated 0.01      Absolute Immature Granulocyte 0.03     Accucheck ([id number])  (Abnormal) Collected:10/05/12 1744     POCT Glucose WB 249 (A) mg/dL UKGURKY:70/62/37 6283      Radiology Results (24 Hour)     Procedure Component Value Units  Date/Time    Abdomen 2 View With Chest 1 View ([id number]) Collected:10/05/12 1900    Order Status:Completed  Updated:10/05/12 1907    Narrative:    CLINICAL HISTORY: Repeated vomiting.    FINDINGS: A PA view of the chest was performed. Cardiomediastinal  silhouette and hilar regions are normal. No effusions or focal  infiltrates are visualized. The lungs are clear. The pulmonary vascular  pattern is normal.     Flat and upright views of the abdomen were performed. There are surgical  clips within the right upper quadrant consistent with a prior  cholecystectomy. There are no dilated loops of large or small bowel.  There is a possibility  of small bowel gas. No significant air-fluid  levels are identified. There is no evidence of free intraperitoneal air.  There is stool throughout the near entirety of the colon.      Impression:       1. No acute cardiopulmonary disease.   2. No ileus or obstruction.  3. Constipation.    Collene Schlichter, MD   10/05/2012 7:03 PM      Findings: no obx per xray, no pancreatitis per lipase, gastritis possible however unable to control vomiting. Likely caused by gastroparesis.      Re-evaluation at time of admission:  Patient having repeated episodes of vomiting.  Unable to control.  Will Admit for further treatment         Amount and/or Complexity of Data Reviewed  Clinical lab tests: ordered and reviewed  Tests in the radiology section of CPT: ordered and reviewed  Discuss the patient with other providers: yes  Independent visualization of images, tracings, or specimens: yes    Risk of Complications, Morbidity, and/or Mortality  Presenting problems: moderate  Diagnostic procedures: moderate  Management options: high    Patient Progress  Patient progress: stable        Procedures    Clinical Impression & Disposition     Clinical Impression  Final diagnoses:   Gastroparesis        ED Disposition     Admit Bed Type: General [8]  Admitting Physician: Darrol Jump [[id number]  Patient Class:  Hospital Outpatient Surgery (Amb Proc) [106]             New Prescriptions    No medications on file           I was acting as a scribe for Gerald Dexter, MD on [name] Treatment Team: Scribe: Erma Heritage    I am the first provider for this patient and I personally performed the services documented. Treatment Team: Scribe: Erma Heritage is scribing for me on Sauve,Chaddrick L. This note accurately reflects work and decisions made by me. Gerald Dexter, MD       Gerald Dexter, MD  10/06/12 [phone]

## 2012-10-06 LAB — HEMOGLOBIN A1C: Hemoglobin A1C: 9.3 % — ABNORMAL HIGH (ref 0.0–6.0)

## 2012-10-06 LAB — BASIC METABOLIC PANEL
BUN: 17 mg/dL (ref 9.0–21.0)
BUN: 15 mg/dL (ref 9.0–21.0)
BUN: 14 mg/dL (ref 9.0–21.0)
CO2: 24 (ref 22–29)
CO2: 25 (ref 22–29)
CO2: 22 (ref 22–29)
Calcium: 8.4 mg/dL — ABNORMAL LOW (ref 8.5–10.5)
Calcium: 8.4 mg/dL — ABNORMAL LOW (ref 8.5–10.5)
Calcium: 8.4 mg/dL — ABNORMAL LOW (ref 8.5–10.5)
Chloride: 110 — ABNORMAL HIGH (ref 98–107)
Chloride: 109 — ABNORMAL HIGH (ref 98–107)
Chloride: 109 — ABNORMAL HIGH (ref 98–107)
Creatinine: 1.1 mg/dL (ref 0.7–1.3)
Creatinine: 1.1 mg/dL (ref 0.7–1.3)
Creatinine: 1.1 mg/dL (ref 0.7–1.3)
Glucose: 169 mg/dL — ABNORMAL HIGH (ref 70–100)
Glucose: 185 mg/dL — ABNORMAL HIGH (ref 70–100)
Glucose: 230 mg/dL — ABNORMAL HIGH (ref 70–100)
Potassium: 4.5 (ref 3.5–5.1)
Potassium: 4.2 (ref 3.5–5.1)
Potassium: 4.4 (ref 3.5–5.1)
Sodium: 141 (ref 136–145)
Sodium: 140 (ref 136–145)
Sodium: 138 (ref 136–145)

## 2012-10-06 LAB — CBC AND DIFFERENTIAL
Basophils Absolute Automated: 0.01 (ref 0.00–0.20)
Basophils Automated: 0 %
Eosinophils Absolute Automated: 0 (ref 0.00–0.70)
Eosinophils Automated: 0 %
Hematocrit: 36 % — ABNORMAL LOW (ref 42.0–52.0)
Hgb: 11.4 g/dL — ABNORMAL LOW (ref 13.0–17.0)
Immature Granulocytes Absolute: 0.02
Immature Granulocytes: 0 %
Lymphocytes Absolute Automated: 2.06 (ref 0.50–4.40)
Lymphocytes Automated: 21 %
MCH: 25.9 pg — ABNORMAL LOW (ref 28.0–32.0)
MCHC: 31.7 g/dL — ABNORMAL LOW (ref 32.0–36.0)
MCV: 81.6 fL (ref 80.0–100.0)
MPV: 9.5 fL (ref 9.4–12.3)
Monocytes Absolute Automated: 0.51 (ref 0.00–1.20)
Monocytes: 5 %
Neutrophils Absolute: 7.37 (ref 1.80–8.10)
Neutrophils: 74 %
Nucleated RBC: 0 (ref 0–1)
Platelets: 230 (ref 140–400)
RBC: 4.41 — ABNORMAL LOW (ref 4.70–6.00)
RDW: 13 % (ref 12–15)
WBC: 9.97 (ref 3.50–10.80)

## 2012-10-06 LAB — POCT GLUCOSE
Whole Blood Glucose POCT: 154 mg/dL — AB (ref 70–100)
Whole Blood Glucose POCT: 158 mg/dL — AB (ref 70–100)
Whole Blood Glucose POCT: 135 mg/dL — AB (ref 70–100)
Whole Blood Glucose POCT: 178 mg/dL — AB (ref 70–100)
Whole Blood Glucose POCT: 190 mg/dL — AB (ref 70–100)

## 2012-10-06 LAB — LIPID PANEL
Cholesterol / HDL Ratio: 4
Cholesterol: 140 mg/dL (ref 0–199)
HDL: 35 mg/dL — ABNORMAL LOW (ref >40)
LDL Calculated: 97 mg/dL (ref 0–99)
Triglycerides: 42 mg/dL (ref 34–149)
VLDL Calculated: 8 mg/dL — ABNORMAL LOW (ref 10–40)

## 2012-10-06 LAB — ECG 12-LEAD
Atrial Rate: 91 {beats}/min
P Axis: 51 degrees
P-R Interval: 172 ms
Q-T Interval: 354 ms
QRS Duration: 90 ms
QTC Calculation (Bezet): 435 ms
R Axis: 32 degrees
T Axis: 26 degrees
Ventricular Rate: 91 {beats}/min

## 2012-10-06 LAB — PHOSPHORUS: Phosphorus: 2.7 mg/dL (ref 2.3–4.7)

## 2012-10-06 LAB — MICROALBUMIN, RANDOM URINE
Urine Creatinine, Random: 232
Urine Microalbumin, Random: 13 (ref 0.0–30.0)
Urine Microalbumin/Creatinine Ratio: 6 ug/mg — ABNORMAL LOW (ref 66–163)

## 2012-10-06 LAB — GFR
EGFR: >60
EGFR: >60
EGFR: >60

## 2012-10-06 LAB — MAGNESIUM: Magnesium: 2 mg/dL (ref 1.6–2.6)

## 2012-10-06 LAB — HEMOLYSIS INDEX: Hemolysis Index: 10 — ABNORMAL HIGH (ref 0–9)

## 2012-10-06 MED ORDER — SODIUM CHLORIDE 0.9 % IV SOLN
250.0000 mg | Freq: Three times a day (TID) | INTRAVENOUS | Status: DC
Start: 2012-10-06 — End: 2012-10-10
  Administered 2012-10-06 – 2012-10-10 (×13): 250 mg via INTRAVENOUS
  Filled 2012-10-06 (×15): qty 250

## 2012-10-06 MED ORDER — ONDANSETRON HCL 4 MG/2ML IJ SOLN
4.0000 mg | Freq: Three times a day (TID) | INTRAMUSCULAR | Status: DC | PRN
Start: 2012-10-06 — End: 2012-10-07
  Administered 2012-10-06 – 2012-10-07 (×2): 4 mg via INTRAVENOUS
  Filled 2012-10-06 (×2): qty 2

## 2012-10-06 MED ORDER — PROMETHAZINE HCL 25 MG/ML IJ SOLN
6.2500 mg | Freq: Four times a day (QID) | INTRAMUSCULAR | Status: DC | PRN
Start: 2012-10-06 — End: 2012-10-07

## 2012-10-06 MED ORDER — LIDOCAINE 5 % EX PTCH
1.0000 | MEDICATED_PATCH | CUTANEOUS | Status: DC
Start: 2012-10-06 — End: 2012-10-10
  Administered 2012-10-06 – 2012-10-10 (×5): 1 via TRANSDERMAL
  Filled 2012-10-06 (×4): qty 1

## 2012-10-06 MED ORDER — SODIUM CHLORIDE 0.9 % IV SOLN
INTRAVENOUS | Status: DC
Start: 2012-10-06 — End: 2012-10-10

## 2012-10-06 MED ORDER — HYDROMORPHONE HCL PF 1 MG/ML IJ SOLN
1.0000 mg | Freq: Four times a day (QID) | INTRAMUSCULAR | Status: DC | PRN
Start: 2012-10-06 — End: 2012-10-07
  Administered 2012-10-06 – 2012-10-07 (×2): 1 mg via INTRAVENOUS
  Filled 2012-10-06 (×2): qty 1

## 2012-10-06 NOTE — Progress Notes (Signed)
------------------------------------------------------------------------------------------------------------------    [facility] Attending Physician Addendum / Note    This patient was seen / examined / discussed with Dr. Army Melia.  Physical exam duplicated.  I agree with assessment and plan as outlined, with any additions or revisions below.     - IVF / antiemetics / pn control  - E-mycin  - GI consult

## 2012-10-06 NOTE — Consults (Signed)
GASTEROENTEROLOGY CONSULTATION    Date Time: 10/06/2012 1:44 PM  Patient Name: [name]  Requesting Physician: Darrol Jump, MD       Reason for Consultation:   gastroparesis  Assessment:   37 yo male with DM type II, not well controlled, hx of gastroparesis p/w intractable nausea, vomiting, and abdominal pain    1. Gastroparesis: he has clinically improved since admission with Reglan and Erythromycin  -Continue Reglan 5mg  IV 4 times daily. If condition worsens, can switch to 10mg  IV TID  -Erythromycin 250mg  IV TID   -Four to Six small diabetic low fat/fiber meals as tolerated daily    2. GERD  -PPI BID  -Carafate 4 times daily    3. DM type II, insulin dependent    4. Abdominal pain, nausea, vomiting: secondary to #1  -Avoid narcotics in this patient, as it can actually exacerbate symptoms      History:   [name] is a 37 y.o. male who presents to the hospital on 10/05/2012 with nausea, vomiting, and abdominal pain. Limited history due to solomence after recent Dilaudid administration. He states that prior to admission he had profuse vomiting for several hours, vomiting over 20 times. He denies hematochezia, melena, hematemesis. He recently had an EGD by Dr. Jens Som at [facility] on 08/08/12 that showed gastritis, biopsies negative for H. Pylori or intestinal metaplasia. It is unclear if he is taking medications at home as prescribed for gastroparesis and diabetes.   Past Medical History:     Past Medical History   Diagnosis Date   . Diabetes    . Hypertensive disorder    . Gastroparesis        Past Surgical History:     Past Surgical History   Procedure Date   . Cholecystectomy    . Knee arthroscopy w/ acl reconstruction      left   . Egd 08/08/2012     Procedure: EGD;  Surgeon: [name], MD;  Location: [facility];  Service: Gastroenterology;  Laterality: N/A;  w\bx       Family History:     Family History   Problem Relation Age of Onset   . Diabetes Father    . Diabetes Maternal  Grandmother    . Diabetes Paternal Grandmother        Social History:     History     Social History   . Marital Status: Single     Spouse Name: N/A     Number of Children: N/A   . Years of Education: N/A     Social History Main Topics   . Smoking status: Never Smoker    . Smokeless tobacco: Never Used   . Alcohol Use: Yes      Comment: socially, once a week maybe   . Drug Use: No   . Sexually Active: Not on file     Other Topics Concern   . Not on file     Social History Narrative   . No narrative on file       Allergies:   No Known Allergies    Medications:     Current Facility-Administered Medications   Medication Dose Route Frequency   . amLODIPine  5 mg Oral Daily   . cloNIDine  0.1 mg Oral Q12H SCH   . erythromycin  250 mg Intravenous Q8H   . heparin (porcine)  5,000 Units Subcutaneous Q8H SCH   . [COMPLETED] HYDROmorphone  1 mg Intravenous  Once   . [COMPLETED] HYDROmorphone  1 mg Intravenous Once   . insulin glargine  18 Units Subcutaneous QHS   . [COMPLETED] insulin regular  12 Units Intravenous Once   . lidocaine  1 patch Transdermal Q24H   . [COMPLETED] metoclopramide  10 mg Intravenous Once   . metoclopramide  5 mg Intravenous Q6H SCH   . [COMPLETED] ondansetron  4 mg Intravenous Once   . [COMPLETED] ondansetron  4 mg Intravenous Once   . [COMPLETED] ondansetron       . pantoprazole  40 mg Intravenous Daily   . [COMPLETED] promethazine  12.5 mg Intravenous Once   . [COMPLETED] promethazine  12.5 mg Intravenous Once   . [COMPLETED] sodium chloride  1,000 mL Intravenous Once   . [COMPLETED] sodium chloride  1,000 mL Intravenous Once   . [COMPLETED] sodium chloride  1,000 mL Intravenous Once   . sucralfate  1 g Oral TID       Review of Systems:   Complete 12 Point ROS negative or normal except those mentioned in HPI above.      Physical Exam:     Filed Vitals:    10/06/12 0727   BP: 130/67   Pulse: 92   Temp: 98.1 F (36.7 C)   Resp: 17   SpO2: 95%     General appearance: Well developed, well nourished,  appears stated age and in NAD  Eyes: Sclera anicteric, pink conjunctivae, no ptosis  ENMT: mucous membranes moist, nose and ears appear normal.  Oropharynx clear.  Chest: Non labored respirations, no audible wheezing, no clubbing or cyanosis  CV:  Regular rate and rhythm, no JVD, no LE edema  Abdomen: soft, non-tender, non-distended,no masses or organomegaly  Skin: Normal color and turgor, no rashes, no suspicious skin lesions noted  Neuro: CN II-XII grossly intact.  No gross movement disorders noted.  Mental status: Appropriate affect, alert and oriented x 3      Labs Reviewed:     Recent Labs   [facility] 10/06/12 0405 10/05/12 1927    WBC 9.97 9.40    HGB 11.4* 13.3    HCT 36.0* 40.5*    PLT 230 253    MCV 81.6 81.5       Recent Labs   Basename 10/06/12 0405 10/05/12 1927    NA 141 139    K 4.5 4.8    CL 110* 104    CO2 24 26    BUN 17.0 16.0    CREAT 1.1 1.4*    GLU 169* 309*    CA 8.4* 9.4    MG -- --    PHOS -- --       Recent Labs   [facility] 10/05/12 1927    AST 13    ALT 15    ALKPHOS 97    BILITOTAL 0.7    BILIDIRECT --    PROT 7.3    ALB 4.1       No results found for this basename: PTT:2,PT:2,INR:2 in the last 72 hours      Rads:   Radiological Procedure reviewed.   AXR/CXR 10/05/12:   1. No acute cardiopulmonary disease.   2. No ileus or obstruction.  3. Constipation.

## 2012-10-06 NOTE — Progress Notes (Signed)
Case Management Initial Discharge Planning Assessment    Psychosocial/Demographic Information   Name of interviewee: pt   Healthcare Decision Maker (HDM) (if other than the patient) self   HDM - Relationship to Patient na   HDM - Contact Information na   Pt lives with friend   Type of residence where patient lives 3rd floor apartment with no elevator   Prior level of functioning (ambulation & ADLs) independent   Correct Insurance listed on face sheet - verified with the patient/HDM yes   Any additional emergency contacts? no   Does the patient have an Advance Directive? no   Is the POA/Guardianship documentation in shadow chart? (if applicable)  na   Source of Income (SSDI. SSI. Social Security, pension, employment, Catering manager) employment     E[employer] in [employer]  Name of Primary Care Physician verified in patient banner (update in patient banner if not listed).  If no PCP, call [phone] with patient in room to get them connected with a PCP. yes   What DME does the patient currently own? (rolling walker, hospital bed, home O2, BiPAP/CPAP, bedside commode, cane, hoyer lift) none   Has the patient been to an Acute Rehab or SNF in the past?  If so, where? no   Does the patient currently have home health or hospice/palliative services in place?  If so, list agency name. none   Does the patient already have community dialysis set up?  If so, where? na     Readmission Assessment  Current LACE Score Reviewed? Yes/No yes   Is this patient an inpatient to inpatient 30 day readmission? no   Does the patient have difficulty obtaining his/her medications? no   Does the patient have difficulty getting to his/her physician appointments? no     Anticipated Discharge Plan  Discussed Anticipated Discharge Date and Discharge Disposition Possibilities with: _X__Patient   ___Healthcare Decision Maker  ___Other   Anticipated Disposition: Option A Home with no needs   Anticipated Disposition: Option B    Who will  transport the patient when ready for discharge? (offer wheelchair van service if patient/family cannot identify transport plan) friend   If applicable, were SNF or Hospice choices provided? na   Palliative Care Consult needed? (if yes, contact attending MD) na   Geriatrics Consult needed? (if yes, contact attending MD) na   Elderlink Referral needed? (if yes, refer through [facility]) na   TCM Referral needed? (if yes, refer through [facility]) na   PACE Referral needed? (if yes, refer through [facility]) na   Are there any potential barriers to discharge identified?      ___Lack of Insurance  ___Lack of Health Literacy  ___Undocumented  ___No resources for meds or medical care  ___Transportation issues  ___Language/Cultural/Spiritual  ___Cognitive level / capacity  ___Psychiatric or substance abuse issues  ___Co-morbidities  ___Potential abuse or neglect  ___Safety issues in the home  ___Potential placement issues  ___Pt / family disagreement with d/c plan  ___Lack of family support  ___Lack of extended family / friend support  ___Home Estate agent (multi-level home/access          issues)   _X__ NONE     Inpatient Medicare/Medicare HMO Patients Only  Was an initial IMM signed within 24 hours of admission?  (Look in Media Tab, Documents Table or Shadow Chart) na     Uninsured Patients Only  If patient has a spouse, does your spouse have insurance under his/her place of employment? na   Did  the patient sign up for insurance through the Affordable Care Act? Na     Achilles Dunk, [place]  SW/CM  [phone]

## 2012-10-06 NOTE — Plan of Care (Addendum)
A&Ox4. Patient resting in bed. Denies nausea/vomiting. Tolerates small meals. Continue to monitor.    Problem: Pain  Goal: Patient's pain/discomfort is manageable  Patient states pain in epigastric area. Lidocaine patch applied. PRN meds given. Patient states relief from pain meds.

## 2012-10-06 NOTE — Student Progress (Signed)
FAMILY MEDICINE PROGRESS NOTE    Date Time: 10/06/2012 7:40 AM  Patient Name: [name]  Attending Physician: Darrol Jump, MD  Team Contact Info: [phone], [phone]     Assessment: [name] is a 37 y.o. male with history of DM2, gastroparesis, and HTN who presented with intractable vomiting 2/2 to gastroparesis. He appears better but has been NPO.     Plan:  #Gastroparesis  - Currently NPO and patient denies symptoms  - Continue Reglan 5 mg IV q6h  - Advance diet and monitor symptoms  - If worsens with po intake, consider starting erythromycin 3 mg/kg IV q8h  - Has no prn anti-emetics currently. Consider adding prn Zofran.   - F/u EKG to check QTc    #Abdominal Pain  - Lipase normal   - Has prn Dilaudid  - D/c Dilaudid and avoid opioids given gastroparesis and constipation  - Can retry Ultram as it has helped in the past    #DM2, insulin dependent  - Claims to be compliant with insulin regimen. A1c = 9.3.  - Continue Lantus 18U qHS (per records takes 36 U at home)  - Continue prn SSI with POCT glucose q4h    #HTN  - Pressures elevated upon presenting to ED, but have improved  - Continue home Norvasc 5 mg po daily and clonidine 0.1 mg q 12h   - Can restart home lisinopril. Takes 20 mg po daily at home, but can start with low dose today and titrate up.     #AKI  - Likely 2/2 dehydration/vomiting   - Improved. Baseline Cr =1, currently at 1.1  - F/u BMP later this afternoon     #GERD  - Continue Protonix and Carafate although both can interact if taken at the same time, so PPI should be taken 30 minutes prior to taking Carafate. Although upon reviewing chart, was instructed to stop Carafate.     #Diet - advance to clear liquids  #PPx - Heparin 5000 SC q8h (can consider switching to Lovenox as Cr improves)  #Dispo - d/c home once n/v improves and tolerating po well    Subjective: Reports that he feels better. Only has had small sips of water with po medications. Denies any further n/v. Denies abdominal  pain currently. Notes that pain is mostly in epigastric area. Required IV Dilaudid x2 ON. Has tried Ultram in the past but reports that it is not as effective as the Dilaudid. Reports he is not constipated despite AXR demonstrating constipation. Last BM was 2 days ago and was normal for patient. Denies f/c, SOB, pain, diarrhea, constipation.     Had EGD done in [facility] (07/2012) showing gastritis and irregular SQ at 40 cm that was biopsied.     Objective:  Temp:  [96.1 F (35.6 C)-98.4 F (36.9 C)] 98.1 F (36.7 C)  Heart Rate:  [92-125] 92   Resp Rate:  [16-22] 17   BP: (127-177)/(59-97) 130/67 mmHg    General: Obese Black male in NAD laying comfortably in bed  HEENT: Mucus membranes moist  CV: 2+ pulses, S1S2 tachycardic, regular rhythm, no m/r/g noted  Lungs: CTAB no wheezes, rhonchi, rales  Abdomen: Obese, normoactive bowel sounds, soft, not distended, diffusely tender to palpation worse in epigastric region, no hepatosplenomegaly noted  Extremities: Trace BLE edema extending to mid-shin    Labs:  Results     Procedure Component Value Units Date/Time    Lipid panel [[id number]]  (Abnormal) Collected:10/06/12 0405  Specimen Information:Blood Updated:10/06/12 [id number]     Cholesterol 140 mg/dL      Triglycerides 42 mg/dL      HDL 35 (L) mg/dL      LDL Calculated 97 mg/dL      VLDL Cholesterol Cal 8 (L) mg/dL      CHOL/HDL Ratio 4.0     Hemolysis index [[id number]]  (Abnormal) Collected:10/06/12 0405     Hemolysis Index 10 (H) Updated:10/06/12 0722    Hemoglobin A1c [[id number]]  (Abnormal) Collected:10/06/12 0405    Specimen Information:Blood Updated:10/06/12 0645     Hemoglobin A1C 9.3 (H) %     GFR [[id number]] Collected:10/06/12 0405     EGFR >60.0 Updated:10/06/12 0506    Basic Metabolic Panel [[id number]]  (Abnormal) Collected:10/06/12 0405    Specimen Information:Blood Updated:10/06/12 0506     Glucose 169 (H) mg/dL      BUN 53.6 mg/dL      Creatinine 1.1 mg/dL      CALCIUM 8.4 (L) mg/dL      Sodium 644       Potassium 4.5      Chloride 110 (H)      CO2 24     CBC with differential [[id number]]  (Abnormal) Collected:10/06/12 0405    Specimen Information:Blood / Blood Updated:10/06/12 0428     WBC 9.97      RBC 4.41 (L)      Hgb 11.4 (L) g/dL      Hematocrit 63.8 (L) %      MCV 81.6 fL      MCH 25.9 (L) pg      MCHC 31.7 (L) g/dL      RDW 13 %      Platelets 230      MPV 9.5 fL      Neutrophils 74 %      Lymphocytes Automated 21 %      Monocytes 5 %      Eosinophils Automated 0 %      Basophils Automated 0 %      Immature Granulocyte 0 %      Nucleated RBC 0      Neutrophils Absolute 7.37      Abs Lymph Automated 2.06      Abs Mono Automated 0.51      Abs Eos Automated 0.00      Absolute Baso Automated 0.01      Absolute Immature Granulocyte 0.02     Basic Metabolic Panel [[id number]] Collected:10/06/12 0405    Specimen Information:Blood Updated:10/06/12 0405    Magnesium [[id number]] Collected:10/06/12 0405    Specimen Information:Blood Updated:10/06/12 0405    Phosphorus [[id number]] Collected:10/06/12 0405    Specimen Information:Blood Updated:10/06/12 0405    POCT glucose (Q4H) [[id number]]  (Abnormal) Collected:10/06/12 0352     POCT Glucose WB 154 (A) mg/dL URKYHCW:23/76/28 3151    Accucheck [[id number]]  (Abnormal) Collected:10/05/12 2235     POCT Glucose WB 281 (A) mg/dL GGYIRSW:54/62/70 3500    i-Stat CG4 Venous CartrIDge [[id number]]  (Abnormal) Collected:10/05/12 2215     i-STAT pH Venous 7.323 Updated:10/05/12 2223     i-STAT pCO2 Venous 50.4      i-STAT pO2 Venous 37.0      i-STAT HCO3 Bicarbonate Venous 26.1      i-STAT Total CO2 Venous 28.0      i-STAT Base Excess Venous -1.0      i-STAT O2 Saturation Venous 64.0 %      i-STAT Lactic acid 3.0 (H)      i-STAT  Patient Temperature 98.7      i-STAT FIO2 21      i-STAT Allen's Test NA      i-STAT Draw Site Venous     Accucheck [[id number]]  (Abnormal) Collected:10/05/12 2130     POCT Glucose WB 283 (A) mg/dL WUJWJXB:14/78/29 5621    UA with Micro [[id number]]   (Abnormal) Collected:10/05/12 1954    Specimen Information:Urine Updated:10/05/12 2020     Urine Type Clean Catch      Color, UA Yellow      Clarity, UA Clear      Specific Gravity UA 1.025      Urine pH 5.5      Leukocyte Esterase, UA Negative      Nitrite, UA Negative      Protein, UA Trace (A)      Glucose, UA >1000 (A)      Ketones UA Trace (A)      Urobilinogen, UA Normal mg/dL      Bilirubin, UA Negative      Blood, UA Negative      Squamous Epithelial Cells, Urine 0 - 5     Acetone [[id number]] Collected:10/05/12 1927    Specimen Information:Blood Updated:10/05/12 2017     Acetone Blood Negative     Comprehensive Metabolic Panel (CMP) [[id number]]  (Abnormal) Collected:10/05/12 1927    Specimen Information:Blood Updated:10/05/12 1954     Glucose 309 (H) mg/dL      BUN 25.3 mg/dL      Creatinine 1.4 (H) mg/dL      Sodium 664      Potassium 4.8      Chloride 104      CO2 26      CALCIUM 9.4 mg/dL      Protein, Total 7.3 g/dL      Albumin 4.1 g/dL      AST (SGOT) 13 U/L      ALT 15 U/L      Alkaline Phosphatase 97 U/L      Bilirubin, Total 0.7 mg/dL      Globulin 3.2 g/dL      Albumin/Globulin Ratio 1.3     Lipase [[id number]] Collected:10/05/12 1927    Specimen Information:Blood Updated:10/05/12 1954     Lipase 43 U/L     GFR [[id number]] Collected:10/05/12 1927     EGFR >60.0 Updated:10/05/12 1954    CBC with Differential [[id number]]  (Abnormal) Collected:10/05/12 1927    Specimen Information:Blood / Blood Updated:10/05/12 1940     WBC 9.40      RBC 4.97      Hgb 13.3 g/dL      Hematocrit 66.0 (L) %      MCV 81.5 fL      MCH 26.8 (L) pg      MCHC 32.8 g/dL      RDW 12 %      Platelets 253      MPV 9.8 fL      Neutrophils 86 %      Lymphocytes Automated 10 %      Monocytes 3 %      Eosinophils Automated 0 %      Basophils Automated 0 %      Immature Granulocyte 0 %      Nucleated RBC 0      Neutrophils Absolute 8.04      Abs Lymph Automated 0.99      Abs Mono Automated 0.32      Abs Eos Automated 0.01      Absolute  Baso Automated 0.01      Absolute Immature Granulocyte 0.03     Accucheck [[id number]]  (Abnormal) Collected:10/05/12 1744     POCT Glucose WB 249 (A) mg/dL FUXNATF:57/32/20 2542        Radiology Results (24 Hour)     Procedure Component Value Units Date/Time    Abdomen 2 View With Chest 1 View [[id number]] Collected:10/05/12 1900    Order Status:Completed  Updated:10/05/12 1907    Narrative:    CLINICAL HISTORY: Repeated vomiting.    FINDINGS: A PA view of the chest was performed. Cardiomediastinal  silhouette and hilar regions are normal. No effusions or focal  infiltrates are visualized. The lungs are clear. The pulmonary vascular  pattern is normal.     Flat and upright views of the abdomen were performed. There are surgical  clips within the right upper quadrant consistent with a prior  cholecystectomy. There are no dilated loops of large or small bowel.  There is a possibility of small bowel gas. No significant air-fluid  levels are identified. There is no evidence of free intraperitoneal air.  There is stool throughout the near entirety of the colon.      Impression:       1. No acute cardiopulmonary disease.   2. No ileus or obstruction.  3. Constipation.    Collene Schlichter, MD   10/05/2012 7:03 PM          Discussed with Dr. Anselm Jungling and Dr. Army Melia  Signed by: Terrance Mass, MS4

## 2012-10-06 NOTE — Plan of Care (Signed)
Problem: Safety  Goal: Patient will be free from injury during hospitalization  Patient is resting in bed at this time. Denies nausea, and no emesis noted. Admitted last evening from ER with diagnosis of gastroparesis. Arrived to unit via stretcher with transport services. Ambulated to bed from stretcher with steady gait. While doing admission process, patient became very nauseated with bile color emesis. 50 cc noted. Also complained of 7/10 abdominal pain. Dilaudid 1 mg given, along with GI meds. Patient slept for remainder of night with no further episodes noted. Tolerated am meds with sips of water. Dilaudid given this am for abdominal pain. NSS infusing at 100 ml/hr. PMH: HTN, GERD, DKA. Patient remains NPO except meds with sips of water. BS this am is 154. Will monitor.

## 2012-10-06 NOTE — Progress Notes (Signed)
FAMILY MEDICINE PROGRESS NOTE    Date Time: 10/06/2012 6:58 AM  Patient Name: [name]  Attending Physician: Darrol Jump, MD  Team Contact Info: [employer] [phone] 8 am -5 pm: After hours [phone]      Assessment:     Active Hospital Problems    Diagnosis   . Intractable nausea and vomiting   . DM (diabetes mellitus), type 1, uncontrolled with complications   . HTN (hypertension)   . Gastroparesis       37 yo with h/o gastroparesis likely 2/2 uncontrolled Diabetes, HTN, presents with intractable vomiting 2/2 gastroparesis no evidence of ileus on Xray     Plan:   # Vomiting 2/2 Gastroparesis   - Obtain records from prior Gastric emptying scan   - Consult [facility] ( Dr. 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 Patient seen and examined and case discussed with Dr. Anselm Jungling. Objective data reviewed. Patient with improved vomiting only 5 episodes overnight. Dull epigastric pain     Review of Systems:   Denies fevers, chills      Physical Exam:       VITAL SIGNS PHYSICAL EXAM    Temp:  [96.1 F (35.6 C)-98.4 F (36.9 C)] 98.4 F (36.9 C)  Heart Rate:  [94-125] 104   Resp Rate:  [16-22] 16   BP: (127-177)/(59-97) 132/63 mmHg     No intake or output data in the 24 hours ending 10/06/12 0658 Physical Exam  Gen: AAOx3 NAD  HEENT: MMM  Cardiac:RRR no MRG   Lungs: CTAB  Abdomen: + BS soft NT, ND   Ext: +2 DP, no clubbing cyanosis edema          Meds:     Medications were reviewed.    Labs:       Labs (last 72 hours):      Lab 10/06/12 0405 10/05/12 1927   WBC 9.97 9.40   HGB 11.4* 13.3   HCT 36.0* 40.5*   PLT 230 253       No results found for this basename: PT:2,INR:2,PTT:2 in the last 168 hours   Lab 10/06/12 0405 10/05/12 1927   NA 141 139   K 4.5 4.8   CL 110*  104   CO2 24 26   BUN 17.0 16.0   CREAT 1.1 1.4*   CA 8.4* 9.4   ALB -- 4.1   PROT -- 7.3   BILITOTAL -- 0.7   ALKPHOS -- 97   ALT -- 15   AST -- 13   GLU 169* 309*     HbA1C: 9.3             Microbiology, reviewed and are significant for:  None      Imaging personally reviewed, including:   AXR: no ileus     Case discussed with:Dr. Ho    Signed by: Kristine Linea, MD PGY-2  Pager # [phone]  [facility]   [address]   [place], [place] [address]

## 2012-10-07 LAB — CBC AND DIFFERENTIAL
Basophils Absolute Automated: 0.02 (ref 0.00–0.20)
Basophils Automated: 0 %
Eosinophils Absolute Automated: 0.08 (ref 0.00–0.70)
Eosinophils Automated: 1 %
Hematocrit: 38 % — ABNORMAL LOW (ref 42.0–52.0)
Hgb: 12.5 g/dL — ABNORMAL LOW (ref 13.0–17.0)
Immature Granulocytes Absolute: 0.02
Immature Granulocytes: 0 %
Lymphocytes Absolute Automated: 1.79 (ref 0.50–4.40)
Lymphocytes Automated: 25 %
MCH: 26.9 pg — ABNORMAL LOW (ref 28.0–32.0)
MCHC: 32.9 g/dL (ref 32.0–36.0)
MCV: 81.7 fL (ref 80.0–100.0)
MPV: 9.7 fL (ref 9.4–12.3)
Monocytes Absolute Automated: 0.34 (ref 0.00–1.20)
Monocytes: 5 %
Neutrophils Absolute: 5 (ref 1.80–8.10)
Neutrophils: 69 %
Nucleated RBC: 0 (ref 0–1)
Platelets: 180 (ref 140–400)
RBC: 4.65 — ABNORMAL LOW (ref 4.70–6.00)
RDW: 13 % (ref 12–15)
WBC: 7.25 (ref 3.50–10.80)

## 2012-10-07 LAB — POCT GLUCOSE
Whole Blood Glucose POCT: 157 mg/dL — AB (ref 70–100)
Whole Blood Glucose POCT: 172 mg/dL — AB (ref 70–100)
Whole Blood Glucose POCT: 199 mg/dL — AB (ref 70–100)
Whole Blood Glucose POCT: 184 mg/dL — AB (ref 70–100)

## 2012-10-07 LAB — PHOSPHORUS: Phosphorus: 3.2 mg/dL (ref 2.3–4.7)

## 2012-10-07 LAB — BASIC METABOLIC PANEL
BUN: 13 mg/dL (ref 9.0–21.0)
CO2: 22 (ref 22–29)
Calcium: 8.1 mg/dL — ABNORMAL LOW (ref 8.5–10.5)
Chloride: 110 — ABNORMAL HIGH (ref 98–107)
Creatinine: 1.1 mg/dL (ref 0.7–1.3)
Glucose: 198 mg/dL — ABNORMAL HIGH (ref 70–100)
Potassium: 4.5 (ref 3.5–5.1)
Sodium: 137 (ref 136–145)

## 2012-10-07 LAB — MAGNESIUM: Magnesium: 1.8 mg/dL (ref 1.6–2.6)

## 2012-10-07 LAB — GFR: EGFR: >60

## 2012-10-07 MED ORDER — PANTOPRAZOLE SODIUM 40 MG PO TBEC
40.0000 mg | DELAYED_RELEASE_TABLET | Freq: Two times a day (BID) | ORAL | Status: DC
Start: 2012-10-07 — End: 2012-10-10
  Administered 2012-10-08 – 2012-10-10 (×6): 40 mg via ORAL
  Filled 2012-10-07 (×6): qty 1

## 2012-10-07 MED ORDER — FLEET ENEMA 7-19 GM/118ML RE ENEM
1.0000 | ENEMA | Freq: Once | RECTAL | Status: DC
Start: 2012-10-07 — End: 2012-10-09
  Filled 2012-10-07: qty 1

## 2012-10-07 MED ORDER — PROMETHAZINE HCL 12.5 MG PO TABS
6.2500 mg | ORAL_TABLET | Freq: Four times a day (QID) | ORAL | Status: DC
Start: 2012-10-07 — End: 2012-10-07
  Filled 2012-10-07 (×5): qty 1

## 2012-10-07 MED ORDER — HYDRALAZINE HCL 20 MG/ML IJ SOLN
10.0000 mg | Freq: Once | INTRAMUSCULAR | Status: AC
Start: 2012-10-07 — End: 2012-10-07
  Administered 2012-10-07: 10 mg via INTRAVENOUS
  Filled 2012-10-07: qty 1

## 2012-10-07 MED ORDER — PANTOPRAZOLE SODIUM 40 MG PO TBEC
40.0000 mg | DELAYED_RELEASE_TABLET | Freq: Every day | ORAL | Status: DC
Start: 2012-10-07 — End: 2012-10-07

## 2012-10-07 MED ORDER — FLEET ENEMA 7-19 GM/118ML RE ENEM
1.0000 | ENEMA | Freq: Once | RECTAL | Status: DC
Start: 2012-10-07 — End: 2012-10-09

## 2012-10-07 MED ORDER — INSULIN GLARGINE 100 UNIT/ML SC SOLN
20.0000 [IU] | Freq: Every evening | SUBCUTANEOUS | Status: DC
Start: 2012-10-07 — End: 2012-10-09
  Administered 2012-10-07 – 2012-10-08 (×2): 20 [IU] via SUBCUTANEOUS
  Filled 2012-10-07 (×2): qty 200

## 2012-10-07 MED ORDER — LIDOCAINE 5 % EX PTCH
1.0000 | MEDICATED_PATCH | Freq: Every day | CUTANEOUS | Status: AC | PRN
Start: 2012-10-07 — End: 2012-10-10

## 2012-10-07 MED ORDER — SIMETHICONE 80 MG PO CHEW
80.0000 mg | CHEWABLE_TABLET | Freq: Four times a day (QID) | ORAL | Status: DC | PRN
Start: 2012-10-07 — End: 2012-10-10

## 2012-10-07 MED ORDER — MAGNESIUM SULFATE IN D5W 10-5 MG/ML-% IV SOLN
1.0000 g | Freq: Once | INTRAVENOUS | Status: AC
Start: 2012-10-07 — End: 2012-10-07
  Administered 2012-10-07: 1 g via INTRAVENOUS
  Filled 2012-10-07: qty 100

## 2012-10-07 MED ORDER — SUCRALFATE 1 G PO TABS
1.0000 g | ORAL_TABLET | Freq: Four times a day (QID) | ORAL | Status: DC
Start: 2012-10-07 — End: 2012-10-10
  Administered 2012-10-07 – 2012-10-10 (×13): 1 g via ORAL
  Filled 2012-10-07 (×13): qty 1

## 2012-10-07 MED ORDER — PROMETHAZINE HCL 25 MG/ML IJ SOLN
6.2500 mg | Freq: Four times a day (QID) | INTRAMUSCULAR | Status: DC
Start: 2012-10-07 — End: 2012-10-07
  Administered 2012-10-07: 6.25 mg via INTRAVENOUS
  Filled 2012-10-07: qty 1

## 2012-10-07 MED ORDER — HYDROMORPHONE HCL PF 1 MG/ML IJ SOLN
0.5000 mg | INTRAMUSCULAR | Status: DC | PRN
Start: 2012-10-07 — End: 2012-10-08
  Administered 2012-10-07 – 2012-10-08 (×4): 0.5 mg via INTRAVENOUS
  Filled 2012-10-07 (×5): qty 1

## 2012-10-07 MED ORDER — TRAMADOL HCL 50 MG PO TABS
50.0000 mg | ORAL_TABLET | Freq: Four times a day (QID) | ORAL | Status: DC | PRN
Start: 2012-10-07 — End: 2012-10-08
  Administered 2012-10-07 (×2): 50 mg via ORAL
  Filled 2012-10-07 (×2): qty 1

## 2012-10-07 MED ORDER — ONDANSETRON HCL 4 MG/2ML IJ SOLN
4.0000 mg | Freq: Three times a day (TID) | INTRAMUSCULAR | Status: DC
Start: 2012-10-07 — End: 2012-10-10
  Administered 2012-10-07 – 2012-10-10 (×9): 4 mg via INTRAVENOUS
  Filled 2012-10-07 (×11): qty 2

## 2012-10-07 MED ORDER — LISINOPRIL 20 MG PO TABS
20.0000 mg | ORAL_TABLET | Freq: Every day | ORAL | Status: DC
Start: 2012-10-07 — End: 2012-10-10
  Administered 2012-10-08 – 2012-10-09 (×2): 20 mg via ORAL
  Filled 2012-10-07 (×3): qty 1

## 2012-10-07 MED ORDER — SUCRALFATE 1 G PO TABS
1.0000 g | ORAL_TABLET | Freq: Four times a day (QID) | ORAL | Status: DC
Start: 2012-10-07 — End: 2013-05-01

## 2012-10-07 MED ORDER — PROMETHAZINE HCL 25 MG/ML IJ SOLN
12.5000 mg | Freq: Four times a day (QID) | INTRAMUSCULAR | Status: DC
Start: 2012-10-07 — End: 2012-10-10
  Administered 2012-10-07 – 2012-10-10 (×11): 12.5 mg via INTRAVENOUS
  Filled 2012-10-07 (×12): qty 1

## 2012-10-07 MED ORDER — BISACODYL 10 MG RE SUPP
10.0000 mg | Freq: Once | RECTAL | Status: AC
Start: 2012-10-08 — End: 2012-10-08
  Administered 2012-10-08: 10 mg via RECTAL
  Filled 2012-10-07: qty 1

## 2012-10-07 MED ORDER — LIDOCAINE 5 % EX PTCH
1.0000 | MEDICATED_PATCH | Freq: Two times a day (BID) | CUTANEOUS | Status: DC | PRN
Start: 2012-10-07 — End: 2012-10-07

## 2012-10-07 MED ORDER — ONDANSETRON 4 MG PO TBDP
8.0000 mg | ORAL_TABLET | Freq: Three times a day (TID) | ORAL | Status: DC
Start: 2012-10-07 — End: 2012-10-07
  Filled 2012-10-07 (×4): qty 2

## 2012-10-07 MED ORDER — PANTOPRAZOLE SODIUM 40 MG PO TBEC
40.0000 mg | DELAYED_RELEASE_TABLET | Freq: Two times a day (BID) | ORAL | Status: DC
Start: 2012-10-07 — End: 2013-01-12

## 2012-10-07 NOTE — Student Progress (Signed)
FAMILY MEDICINE PROGRESS NOTE    Date Time: 10/07/2012 7:35 AM  Patient Name: [name]  Attending Physician: Darrol Jump, MD  Team Contact Info: [employer] [phone], after 5pm [phone]     Assessment: [name] is a 37 y.o. male with history of DM2, gastroparesis, and HTN who presented with intractable vomiting 2/2 to acute exacerbation of gastroparesis. Is tolerating po and is doing better but continues to have episodes of vomiting and abdominal pain.    Plan:  #Gastroparesis   - Acute exacerbation of chronic issue. Improving.  - AXR on at time of admission excluded ileus. Also, LFTs and lipase were normal making pancreatitis or hepatobiliary process less likely.   - Appreciate GI consult  - Continue Reglan 5 mg IV q6h. If symptoms worsen can increase to 10 mg.  - Continue Erythromycin 250mg  IV q8h   - Continue 4-6 small diabetic, low fat/fiber meals   - Continue prn Zofran 4 mg IV q8h and Phenergan 6.25 mg IV q6h. If patient continues to vomit, can schedule Zofran TID.   - Switch medications of po     #Abdominal Pain   - Likely secondary to intractable n/v 2/2 gastroparesis. Also has history of gastric pressure - possibly gas.  - Has prn Dilaudid   - Strongly discourage use of Dilaudid and encouraged to avoid opioids given gastroparesis and constipation   - Continue Lidoderm patch   - If continues to use Dilaudid can consider Relistor, which selectively antagonizes peripheral mu-opioid receptors, or naloxone to avoid opioid-induced GI hypomotility   - Will add simethicone     #DM2, insulin dependent   - Chronic, diagnosed at age of 77  - Claims to be compliant with insulin regimen. A1c = 9.3. Microalbinuria low.   - Currently on Lantus 18 U qHS (per records takes 36 U at home, which is consistent with 0.3 units/kg) and required only 3 U SSI yesterday.  - Will increase Lantus to 20U qHS  - Continue prn SSI with POCT glucose with meals    #HTN   - Pressures elevated upon presenting to ED, but have  improved   - Continue home Norvasc 5 mg po daily and clonidine 0.1 mg q 12h   - Can restart home lisinopril. Takes 20 mg po daily at home, but can start with low dose today and titrate up.   - Continue to monitor BP to check for hypotension after restarting home lisinopril.     #AKI   - Likely 2/2 dehydration/vomiting   - Improved. Baseline Cr =1, currently at 1.1   - F/u BMP for this morning  - On IVF - NS @ 100 cc/hr. Will decrease and d/c later today if tolerating po well.     #GERD   - Chronic with history of gastritis and EGD on 08/08/12 negative for H. pylori and intestinal metaplasia  - Appreciate GI consult   - Continue Protonix and Carafate although both can interact if taken at the same time, so PPI should be taken 30 minutes prior to taking Carafate.   - Change dosing of Carafate to 4 times daily from 3 times daily, per GI    #GI/FEN - 4-6 small diabetic, low fat, low fiber meals. Replete Mg.   #PPx - Heparin 5000 SC q8h (can consider switching to Lovenox as Cr improves)   #Dispo - d/c home once n/v improves and tolerating po well    Subjective:   [name] reports that he is  feeling better. He was able to eat yesterday but vomited clear, NBNB fluid last night around 8/9 pm three times. His pain was relieved with the Lidoderm patch but patch fell off yesterday. He required 3 doses of Dilaudid yesterday for continued epigastric pain 2/2 vomiting. Denies having nausea, f/c, SOB, HA, dizziness, diarrhea. Had regular BM yesterday.    Meds:  Scheduled Meds:  Current Facility-Administered Medications   Medication Dose Route Frequency   . amLODIPine  5 mg Oral Daily   . cloNIDine  0.1 mg Oral Q12H SCH   . erythromycin  250 mg Intravenous Q8H   . heparin (porcine)  5,000 Units Subcutaneous Q8H SCH   . insulin glargine  20 Units Subcutaneous QHS   . lidocaine  1 patch Transdermal Q24H   . lisinopril  20 mg Oral Daily   . magnesium sulfate  1 g Intravenous Once   . metoclopramide  5 mg Intravenous Q6H SCH   .  pantoprazole  40 mg Intravenous Daily   . sucralfate  1 g Oral TID   . [DISCONTINUED] insulin glargine  18 Units Subcutaneous QHS   Continuous Infusions:       . sodium chloride 100 mL/hr at 10/07/12 0050   PRN Meds:.acetaminophen, dextrose, dextrose, HYDROmorphone, insulin aspart, ondansetron, promethazine, [DISCONTINUED] HYDROmorphone    Objective:  Temp:  [97.3 F (36.3 C)] 97.3 F (36.3 C)  Heart Rate:  [81] 81   Resp Rate:  [16] 16   BP: (138)/(82) 138/82 mmHg  No intake or output data in the 24 hours ending 10/07/12 0735    General: Obese Black male in NAD laying comfortably in bed   HEENT: Mucus membranes moist   CV: 2+ pulses, S1S2 RRR with no m/r/g noted   Lungs: CTAB no wheezes, rhonchi, rales   Abdomen: Obese, normoactive bowel sounds, soft, not distended, nontender this morning but expresses that he is sore when palpating epigastric area, no hepatosplenomegaly noted   Extremities: Trace bilateral pedal edema, no clubbing or cyanosis     Labs:  Results     Procedure Component Value Units Date/Time    Basic Metabolic Panel [[id number]] Collected:10/07/12 0311    Specimen Information:Blood Updated:10/07/12 0726    Magnesium [[id number]] Collected:10/07/12 0326    Specimen Information:Blood Updated:10/07/12 0410     Magnesium 1.8 mg/dL     Phosphorus [[id number]] Collected:10/07/12 0326    Specimen Information:Blood Updated:10/07/12 0410     Phosphorus 3.2 mg/dL     CBC with differential [[id number]]  (Abnormal) Collected:10/07/12 0326    Specimen Information:Blood / Blood Updated:10/07/12 0349     WBC 7.25      RBC 4.65 (L)      Hgb 12.5 (L) g/dL      Hematocrit 62.9 (L) %      MCV 81.7 fL      MCH 26.9 (L) pg      MCHC 32.9 g/dL      RDW 13 %      Platelets 180      MPV 9.7 fL      Neutrophils 69 %      Lymphocytes Automated 25 %      Monocytes 5 %      Eosinophils Automated 1 %      Basophils Automated 0 %      Immature Granulocyte 0 %      Nucleated RBC 0      Neutrophils Absolute 5.00      Abs Lymph  Automated 1.79  Abs Mono Automated 0.34      Abs Eos Automated 0.08      Absolute Baso Automated 0.02      Absolute Immature Granulocyte 0.02     POCT glucose (Q4H) [[id number]]  (Abnormal) Collected:10/06/12 2130    Specimen Information:Blood Updated:10/06/12 2149     POCT Glucose WB 190 (A) mg/dL     Basic Metabolic Panel [[id number]]  (Abnormal) Collected:10/06/12 2000    Specimen Information:Blood Updated:10/06/12 2032     Glucose 230 (H) mg/dL      BUN 27.2 mg/dL      Creatinine 1.1 mg/dL      CALCIUM 8.4 (L) mg/dL      Sodium 536      Potassium 4.4      Chloride 109 (H)      CO2 22     GFR [[id number]] Collected:10/06/12 2000     EGFR >60.0 Updated:10/06/12 2032    Magnesium [[id number]] Collected:10/06/12 2000    Specimen Information:Blood Updated:10/06/12 2032     Magnesium 2.0 mg/dL     Phosphorus [[id number]] Collected:10/06/12 2000    Specimen Information:Blood Updated:10/06/12 2032     Phosphorus 2.7 mg/dL     Microalbumin, Semiquantitative [[id number]]  (Abnormal) Collected:10/06/12 1727     Microalbumin: Instrument Value 13.0 Updated:10/06/12 1847     Creatinine, UR 232.0      Microalbumin Calculation, Random Urine 6 (L) ug/mg     Basic Metabolic Panel [[id number]]  (Abnormal) Collected:10/06/12 1720    Specimen Information:Blood Updated:10/06/12 1756     Glucose 185 (H) mg/dL      BUN 57.3 mg/dL      Creatinine 1.1 mg/dL      CALCIUM 8.4 (L) mg/dL      Sodium 220      Potassium 4.2      Chloride 109 (H)      CO2 25     GFR [[id number]] Collected:10/06/12 1720     EGFR >60.0 Updated:10/06/12 1756    POCT glucose (Q4H) [[id number]]  (Abnormal) Collected:10/06/12 1653     POCT Glucose WB 178 (A) mg/dL OHYWVPX:10/62/69 4854    POCT glucose (Q4H) [[id number]]  (Abnormal) Collected:10/06/12 1118     POCT Glucose WB 135 (A) mg/dL FGHWEXH:37/16/96 7893    POCT glucose (Q4H) [[id number]]  (Abnormal) Collected:10/06/12 0729     POCT Glucose WB 158 (A) mg/dL HENIDPO:24/23/53 6144          Imaging:  Abdomen 2 View With  Chest 1 View    10/05/2012    1. No acute cardiopulmonary disease.  2. No ileus or obstruction. 3. Constipation.  Collene Schlichter, MD  10/05/2012 7:03 PM       Case discussed with: Dr. Army Melia and Dr. Anselm Jungling    Signed by: Terrance Mass, MS4

## 2012-10-07 NOTE — Student Progress (Signed)
This note for student educational purposes only. Please see my note for full assessment and plan.   Kristine Linea, MD

## 2012-10-07 NOTE — Plan of Care (Addendum)
Problem: Pain  Goal: Patient's pain/discomfort is manageable  Outcome: Not Progressing  Patient states pain in abdomen. Lidocaine patch applied to epigastric area. Paged MD for PRN medications. Tramadol given for pain with no relief. Paged MD. IV dilaudid given.    Comments:   A&Ox4. Nausea vomiting. Zofran given in morning. MD changed IV meds to PO. Patient continued to have n/v. Paged MD. New order for IV antiemetics. Phenergan given. Patient continues to have vomiting throughout day, unable to tolerate any PO meds and food. Continue to monitor.

## 2012-10-07 NOTE — Progress Notes (Signed)
Pt alert and oriented x4, family at bedside. Pt c/o nausea and vomiting, producing clear emesis x3 episodes. N/v relieved by IV Zofran. Re-ocurrance of pain in the epigastric area, relieved by Dilaudid. Vitals stable. Will continue to monitor.

## 2012-10-07 NOTE — Progress Notes (Signed)
FAMILY MEDICINE PROGRESS NOTE    Date Time: 10/07/2012 7:10 AM  Patient Name: [name]  Attending Physician: Darrol Jump, MD  Team Contact Info: [facility] [phone] 8 am -5 pm: After hours [phone]      Assessment:     Active Hospital Problems    Diagnosis   . Intractable nausea and vomiting   . DM (diabetes mellitus), type 1, uncontrolled with complications   . HTN (hypertension)   . Gastroparesis       37 yo with h/o gastroparesis likely 2/2 uncontrolled Diabetes, HTN, presents with intractable vomiting 2/2 gastroparesis no evidence of ileus on Xray     Plan:   # Vomiting 2/2 Gastroparesis   - EGD on 08/08/12 no evidence of gastric outlet obstruction, diffuse gastropathy was present    Irregular squamocolumnar junction - repeat EGD in 3 years , H pylori negative   - Appreciate  [facility] ( Dr. 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 Anselm Jungling. Objective data reviewed. Dilaudid x 2 overnight, zofran x 1. Pain improved with lidocaine patch     Review of Systems:   Denies fevers, chills      Physical Exam:       VITAL SIGNS PHYSICAL EXAM    Temp:  [97.3 F (36.3 C)-98.1 F (36.7 C)] 97.3 F (36.3 C)  Heart Rate:  [81-92] 81   Resp Rate:  [16-17] 16   BP: (130-138)/(67-82) 138/82 mmHg     No intake or output data in the 24 hours ending 10/07/12 0710 Physical Exam  Gen: AAOx3 NAD  HEENT: MMM  Cardiac:RRR no MRG   Lungs: CTAB  Abdomen: + BS soft NT, ND   Ext: +2 DP, no clubbing cyanosis edema          Meds:     Medications were reviewed.    Labs:       Labs (last 72 hours):      Lab 10/07/12 0326 10/06/12 0405   WBC 7.25 9.97   HGB 12.5* 11.4*   HCT 38.0* 36.0*   PLT 180 230       No results found for this basename: PT:2,INR:2,PTT:2 in the last 168 hours   Lab 10/06/12 2000 10/06/12 1720 10/05/12 1927   NA 138 140 --  K 4.4 4.2 --   CL 109* 109* --   CO2 22 25 --   BUN 14.0 15.0 --   CREAT 1.1 1.1 --   CA 8.4* 8.4* --   ALB -- -- 4.1   PROT -- -- 7.3   BILITOTAL -- -- 0.7   ALKPHOS -- -- 97   ALT -- -- 15   AST -- -- 13   GLU 230* 185* --     HbA1C: 9.3             Microbiology, reviewed and are significant for:  None      Imaging personally reviewed, including:   AXR: no ileus     Case discussed with:Dr. Ho    Signed by: Kristine Linea, MD PGY-2  Pager # [phone]  [facility]   [address]   [place], [place] [address]

## 2012-10-07 NOTE — Progress Notes (Signed)
PROGRESS NOTE    Date Time: 10/07/2012 7:38 PM  Patient Name: [name], [name]    Chief Complaint:   NV 2/2 diabetic gastroparesis    Assessment and Plan:   65M with poorly controlled DM and associated gastroparesis with intractable NV.    1) Gastroparesis  Increase Reglan to 10mg  IV tid for symptomatic mgmt  Continue Erythromycin  Continue frequent small low fat meals within diabetic diet  Tight glucose control should help as well    2) Constipation   Fleet enema x 2 this evening followed by Dulcolax suppository in AM.  Will reassess tomorrow.      3) GERD  Continue PPI bid and Carafate    2) DM type 2, insulin dependent    5) Abdominal Pain with NV  Avoid narcotics which will slow GI motility and worsen constipation      Subjective:   Pt reports a period of slight improvement of NV yesterday and overnight, but following an abdominal exam earlier today pain and NV re-emerged.  Pt began dry heaving and expelled saliva multiple times within a few minutes during this encounter.      Medications:     Current Facility-Administered Medications   Medication Dose Route Frequency   . amLODIPine  5 mg Oral Daily   . [START ON 10/08/2012] bisacodyl  10 mg Rectal Once   . cloNIDine  0.1 mg Oral Q12H SCH   . erythromycin  250 mg Intravenous Q8H   . heparin (porcine)  5,000 Units Subcutaneous Q8H SCH   . [COMPLETED] hydrALAZINE  10 mg Intravenous Once   . insulin glargine  20 Units Subcutaneous QHS   . lidocaine  1 patch Transdermal Q24H   . lisinopril  20 mg Oral Daily   . [COMPLETED] magnesium sulfate  1 g Intravenous Once   . metoclopramide  5 mg Intravenous Q6H SCH   . ondansetron  4 mg Intravenous Q8H   . pantoprazole  40 mg Oral Q12H SCH   . promethazine  12.5 mg Intravenous Q6H   . sodium phosphate  1 enema Rectal Once   . sodium phosphate  1 enema Rectal Once   . sucralfate  1 g Oral QID   . [DISCONTINUED] insulin glargine  18 Units Subcutaneous QHS   . [DISCONTINUED] ondansetron  8 mg Oral Q8H   . [DISCONTINUED]  pantoprazole  40 mg Intravenous Daily   . [DISCONTINUED] promethazine  6.25 mg Intravenous Q6H   . [DISCONTINUED] promethazine  6.25 mg Oral Q6H   . [DISCONTINUED] sucralfate  1 g Oral TID       Review of Systems:   Complete 12 Point ROS negative or normal except those mentioned in HPI above.      Physical Exam:     Filed Vitals:    10/07/12 1848   BP: 198/92   Pulse: 104   Temp: 98.3 F (36.8 C)   Resp: 18   SpO2: 96%       General appearance: Well developed, well nourished, appears stated age and in NAD  Eyes: Sclera anicteric, pink conjunctivae, no ptosis  ENMT: mucous membranes moist, nose and ears appear normal.  Oropharynx clear.  Chest: Non labored respirations, no audible wheezing, no clubbing or cyanosis  CV:  Regular rate and rhythm, no JVD, no LE edema  Abdomen: obese, palpation not performed due to NV, non-distended  Skin: Normal color and turgor, no rashes, no suspicious skin lesions noted  Neuro: CN II-XII grossly intact.  No  gross movement disorders noted.  Mental status: Appropriate affect, alert and oriented x 3      Labs:     Recent Labs   [facility] 10/07/12 0326 10/06/12 0405    WBC 7.25 9.97    HGB 12.5* 11.4*    HCT 38.0* 36.0*    PLT 180 230    MCV 81.7 81.6       Recent Labs   Basename 10/07/12 0326 10/07/12 0311 10/06/12 2000    NA -- 137 138    K -- 4.5 4.4    CL -- 110* 109*    CO2 -- 22 22    BUN -- 13.0 14.0    CREAT -- 1.1 1.1    GLU -- 198* 230*    CA -- 8.1* 8.4*    MG 1.8 -- 2.0    PHOS 3.2 -- 2.7       Recent Labs   [facility] 10/05/12 1927    AST 13    ALT 15    ALKPHOS 97    BILITOTAL 0.7    BILIDIRECT --    PROT 7.3    ALB 4.1       No results found for this basename: PTT:2,PT:2,INR:2 in the last 72 hours      Rads:   Radiological Procedure reviewed.  AXR/CXR 10/05/12:   1. No acute cardiopulmonary disease.   2. No ileus or obstruction.  3. Constipation.        Endoscopy:       Pathology:

## 2012-10-07 NOTE — Student Other (Incomplete)
FAMILY MEDICINE DISCHARGE SUMMARY    Date Time: 10/07/2012 10:44 AM  Patient Name: [name]  Attending Physician: Darrol Jump, MD  Primary Care Physician: [name], MD    Date of Admission: 10/05/2012  Date of Discharge: ***    Discharge Diagnoses:     Active Problems:   Gastroparesis   DM (diabetes mellitus), type 1, uncontrolled with complications   HTN (hypertension)   Intractable nausea and vomiting  Resolved Problems:   * No resolved hospital problems. *       Disposition:     home with family    Pending Results, Recommendations & Instructions to providers after discharge:     1. Micro / Labs / Path pending: None  2. Wound Care Instructions: None  3. Date of completion for antibiotics or other medications: Continue to take Zofran, Phenergan, and Erythromycin for gastroparesis. Continue Lidoderm patch for abdominal pain. Avoid opioids such as oxycodone and dilaudid as these medications can slow down gastric motility and make gastroparesis worse. ***    Recent Labs - Last 2:         Lab 10/07/12 0326 10/06/12 0405   WBC 7.25 9.97   HGB 12.5* 11.4*   HCT 38.0* 36.0*   PLT 180 230       No results found for this basename: PT,INR,PTT in the last 168 hours    Lab 10/05/12 1927   ALKPHOS 97   BILITOTAL 0.7   BILIDIRECT --   PROT 7.3   ALB 4.1   ALT 15   AST 13        Lab 10/07/12 0326 10/07/12 0311 10/06/12 2000   NA -- 137 138   K -- 4.5 4.4   CL -- 110* 109*   CO2 -- 22 22   BUN -- 13.0 14.0   GLU -- 198* 230*   CA -- 8.1* 8.4*   MG 1.8 -- 2.0   PHOS 3.2 -- 2.7         Lab 10/06/12 0405   CHOL 140   TRIG 42   HDL 35*   LDL 97     No results found for this basename: TSH,FREET3,FREET4 in the last 168 hours         Procedures/Radiology performed:     Abdomen 2 View With Chest 1 View  10/05/2012    1. No acute cardiopulmonary disease.  2. No ileus or obstruction. 3. Constipation.  Radiologist: Collene Schlichter, MD  10/05/2012 7:03 PM        Hospital Course:     Reason for admission/ HPI: [name]  is a 37 y.o. male with history of HTN, GERD, and DM2 with neuropathy and gastroparesis who presented to [facility] on 10/05/2012 with intractable nausea, NBNB vomiting, and abdominal pain 2/2 gastroparesis.      Hospital Course: In the ED, he received 2 x 1L bolus NS for hydration, dilaudid x 2 for pain, reglan x1, zofran x 2, phenergan x 2 for nausea and vomiting with little relief.   ***    Home lisinopril was held given that his Cr was 1.4 on admission. It was restarted on 10/07/12 with improvement of Cr to 1.1 after IVFs, decreased vomiting, and better po intake.     Gastroenterology was consulted to help with managing patient's gastroparesis.       Discharge Day Exam:  Temp:  [97.3 F (36.3 C)-97.8 F (36.6 C)] 97.8 F (36.6 C)  Heart  Rate:  [75-81] 75   Resp Rate:  [16-17] 17   BP: (138-150)/(82-88) 150/88 mmHg    General: Obese Black male in NAD laying comfortably in bed   HEENT: Mucus membranes moist   CV: 2+ pulses, S1S2 RRR with no m/r/g noted   Lungs: CTAB no wheezes, rhonchi, rales   Abdomen: Obese, normoactive bowel sounds, soft, not distended, nontender this morning but expresses that he is sore when palpating epigastric area, no hepatosplenomegaly noted   Extremities: Trace bilateral pedal edema, no clubbing or cyanosis     Consultations:     Treatment Team: Attending Provider: Darrol Jump, MD; Case Manager: Achilles Dunk; Consulting Physician: Lestine Mount, MD; Registered Nurse: Lollie Marrow, RN; Technician: Cloretta Ned; Registered Nurse: Norris Cross, RN; Registered Nurse: Call, Christa, RN    Discharge Condition:     Improved, stable    Discharge Instructions & Follow Up Plan for Patient:     Diet: 4-6 small diabetic, low-fat, low-fiber meals    Activity/Weight Bearing Status: As tolerated    Patient was instructed to follow up with:   - Primary Care Doctor [name], MD within 3 days   - Gastroenterologist, [name], MD in 2 weeks    Discharge Code  Status: Full  Patient Emergency Contact: [phone]    Complete instructions and follow up are in the patient's After Visit Summary    Minutes spent coordinating discharge and reviewing discharge plan:*** minutes    Discharge Medications:        Medication List       As of 10/07/2012 10:44 AM      START taking these medications           lidocaine 5 %    Commonly known as: LIDODERM    Place 1 patch onto the skin daily as needed (leave in place for 12 hours only ). Remove & Discard patch within 12 hours or as directed by MD         CHANGE how you take these medications           pantoprazole 40 MG tablet    Commonly known as: PROTONIX    Take 1 tablet (40 mg total) by mouth daily.    What changed: how often to take the med         CONTINUE taking these medications           amLODIPine 5 MG tablet    Commonly known as: NORVASC    Take 1 tablet (5 mg total) by mouth daily.        cloNIDine 0.1 MG tablet    Commonly known as: CATAPRES    Take 1 tablet (0.1 mg total) by mouth every 12 (twelve) hours.        sucralfate 1 G tablet    Commonly known as: CARAFATE    Take 1 tablet (1 g total) by mouth 3 (three) times daily.               Where to get your medications     These are the prescriptions that you need to pick up.    You may get these medications from any pharmacy.           lidocaine 5 %    pantoprazole 40 MG tablet                     Immunizations provided: None  [facility]   [address]   [place], [place] [address]  [phone]    Signed by: Terrance Mass    CC: [name], MD

## 2012-10-07 NOTE — Progress Notes (Signed)
------------------------------------------------------------------------------------------------------------------    [facility] Attending Physician Addendum / Note    This patient was seen / examined / discussed with Dr. Army Melia.  Physical exam duplicated.  I agree with assessment and plan as outlined, with any additions or revisions below.     - IVF / antiemetics  - Standing tramadol  - pn control: try to avoid opiates - encouraged pt to reserve use for breakthrough pain only  - consider low-dose naloxone  - E-mycin  - standing zofran / phenergan (in addition to PRN)  - await further per GI consult

## 2012-10-07 NOTE — Progress Notes (Deleted)
PROGRESS NOTE    Date Time: 10/07/2012 4:13 PM  Patient Name: [name], [name]    Chief Complaint:   NV 2/2 diabetic gastroparesis    Assessment and Plan:   72M with poorly controlled DM and associated gastroparesis with intractable NV.    1) Gastroparesis  Increase Reglan to 10mg  IV tid for symptomatic mgmt  Continue Erythromycin  Continue small meals within diabetic diet    2) Constipation   Fleet enema x 2 this evening followed by Dulcolax suppository in AM.  Will reassess tomorrow.      3) GERD  Continue PPI bid and Carafate    2) DM type 2, insulin dependent    5) Abdominal Pain with NV  Avoid narcotics which may exacerbate this problem      Subjective:   Pt reports a period of slight improvement of NV yesterday and overnight, but following an abdominal exam earlier today pain and NV re-emerged.  Pt began dry heaving and expelled saliva multiple times within a few minutes during this encounter.      Medications:     Current Facility-Administered Medications   Medication Dose Route Frequency   . amLODIPine  5 mg Oral Daily   . cloNIDine  0.1 mg Oral Q12H SCH   . erythromycin  250 mg Intravenous Q8H   . heparin (porcine)  5,000 Units Subcutaneous Q8H SCH   . insulin glargine  20 Units Subcutaneous QHS   . lidocaine  1 patch Transdermal Q24H   . lisinopril  20 mg Oral Daily   . [COMPLETED] magnesium sulfate  1 g Intravenous Once   . metoclopramide  5 mg Intravenous Q6H SCH   . ondansetron  4 mg Intravenous Q8H   . pantoprazole  40 mg Oral Q12H SCH   . promethazine  12.5 mg Intravenous Q6H   . sucralfate  1 g Oral QID   . [DISCONTINUED] insulin glargine  18 Units Subcutaneous QHS   . [DISCONTINUED] ondansetron  8 mg Oral Q8H   . [DISCONTINUED] pantoprazole  40 mg Intravenous Daily   . [DISCONTINUED] promethazine  6.25 mg Intravenous Q6H   . [DISCONTINUED] promethazine  6.25 mg Oral Q6H   . [DISCONTINUED] sucralfate  1 g Oral TID       Review of Systems:   Complete 12 Point ROS negative or normal except those  mentioned in HPI above.      Physical Exam:     Filed Vitals:    10/07/12 0742   BP: 150/88   Pulse: 75   Temp: 97.8 F (36.6 C)   Resp: 17   SpO2: 97%       General appearance: Well developed, well nourished, appears stated age and in NAD  Eyes: Sclera anicteric, pink conjunctivae, no ptosis  ENMT: mucous membranes moist, nose and ears appear normal.  Oropharynx clear.  Chest: Non labored respirations, no audible wheezing, no clubbing or cyanosis  CV:  Regular rate and rhythm, no JVD, no LE edema  Abdomen: obese, palpation not performed due to NV, non-distended  Skin: Normal color and turgor, no rashes, no suspicious skin lesions noted  Neuro: CN II-XII grossly intact.  No gross movement disorders noted.  Mental status: Appropriate affect, alert and oriented x 3      Labs:     Recent Labs   [facility] 10/07/12 0326 10/06/12 0405    WBC 7.25 9.97    HGB 12.5* 11.4*    HCT 38.0* 36.0*    PLT 180  230    MCV 81.7 81.6       Recent Labs   Basename 10/07/12 0326 10/07/12 0311 10/06/12 2000    NA -- 137 138    K -- 4.5 4.4    CL -- 110* 109*    CO2 -- 22 22    BUN -- 13.0 14.0    CREAT -- 1.1 1.1    GLU -- 198* 230*    CA -- 8.1* 8.4*    MG 1.8 -- 2.0    PHOS 3.2 -- 2.7       Recent Labs   [facility] 10/05/12 1927    AST 13    ALT 15    ALKPHOS 97    BILITOTAL 0.7    BILIDIRECT --    PROT 7.3    ALB 4.1       No results found for this basename: PTT:2,PT:2,INR:2 in the last 72 hours      Rads:   Radiological Procedure reviewed.  AXR/CXR 10/05/12:   1. No acute cardiopulmonary disease.   2. No ileus or obstruction.  3. Constipation.        Endoscopy:       Pathology:

## 2012-10-07 NOTE — Progress Notes (Signed)
Patient BP 198/92. Patient shivering. Denies SOB, chest pain, no sweating. Bishoff MD called. New order for hydralazine. Recheck BP in one hour.

## 2012-10-08 ENCOUNTER — Inpatient Hospital Stay: Payer: BC Managed Care – PPO

## 2012-10-08 LAB — CBC AND DIFFERENTIAL
Basophils Absolute Automated: 0.01 (ref 0.00–0.20)
Basophils Automated: 0 %
Eosinophils Absolute Automated: 0 (ref 0.00–0.70)
Eosinophils Automated: 0 %
Hematocrit: 38.4 % — ABNORMAL LOW (ref 42.0–52.0)
Hgb: 12.6 g/dL — ABNORMAL LOW (ref 13.0–17.0)
Immature Granulocytes Absolute: 0.02
Immature Granulocytes: 0 %
Lymphocytes Absolute Automated: 1.63 (ref 0.50–4.40)
Lymphocytes Automated: 20 %
MCH: 26.4 pg — ABNORMAL LOW (ref 28.0–32.0)
MCHC: 32.8 g/dL (ref 32.0–36.0)
MCV: 80.5 fL (ref 80.0–100.0)
MPV: 9.8 fL (ref 9.4–12.3)
Monocytes Absolute Automated: 0.36 (ref 0.00–1.20)
Monocytes: 4 %
Neutrophils Absolute: 6.13 (ref 1.80–8.10)
Neutrophils: 75 %
Nucleated RBC: 0 (ref 0–1)
Platelets: 230 (ref 140–400)
RBC: 4.77 (ref 4.70–6.00)
RDW: 12 % (ref 12–15)
WBC: 8.15 (ref 3.50–10.80)

## 2012-10-08 LAB — BASIC METABOLIC PANEL
BUN: 11 mg/dL (ref 9.0–21.0)
CO2: 26 (ref 22–29)
Calcium: 8.5 mg/dL (ref 8.5–10.5)
Chloride: 106 (ref 98–107)
Creatinine: 1.1 mg/dL (ref 0.7–1.3)
Glucose: 184 mg/dL — ABNORMAL HIGH (ref 70–100)
Potassium: 4 (ref 3.5–5.1)
Sodium: 139 (ref 136–145)

## 2012-10-08 LAB — POCT GLUCOSE
Whole Blood Glucose POCT: 152 mg/dL — AB (ref 70–100)
Whole Blood Glucose POCT: 138 mg/dL — AB (ref 70–100)
Whole Blood Glucose POCT: 145 mg/dL — AB (ref 70–100)
Whole Blood Glucose POCT: 213 mg/dL — AB (ref 70–100)
Whole Blood Glucose POCT: 195 mg/dL — AB (ref 70–100)

## 2012-10-08 LAB — GFR: EGFR: >60

## 2012-10-08 LAB — PHOSPHORUS: Phosphorus: 4 mg/dL (ref 2.3–4.7)

## 2012-10-08 LAB — MAGNESIUM: Magnesium: 1.8 mg/dL (ref 1.6–2.6)

## 2012-10-08 MED ORDER — METOCLOPRAMIDE HCL 5 MG/ML IJ SOLN
10.0000 mg | Freq: Three times a day (TID) | INTRAMUSCULAR | Status: DC
Start: 2012-10-08 — End: 2012-10-10
  Administered 2012-10-08 – 2012-10-10 (×7): 10 mg via INTRAVENOUS
  Filled 2012-10-08 (×9): qty 2

## 2012-10-08 MED ORDER — HYDROMORPHONE HCL PF 1 MG/ML IJ SOLN
0.5000 mg | Freq: Once | INTRAMUSCULAR | Status: AC
Start: 2012-10-08 — End: 2012-10-08
  Administered 2012-10-08: 0.5 mg via INTRAVENOUS

## 2012-10-08 MED ORDER — TRAMADOL HCL 50 MG PO TABS
100.0000 mg | ORAL_TABLET | Freq: Four times a day (QID) | ORAL | Status: DC | PRN
Start: 2012-10-08 — End: 2012-10-10
  Administered 2012-10-08 – 2012-10-09 (×4): 100 mg via ORAL
  Filled 2012-10-08 (×4): qty 2

## 2012-10-08 MED ORDER — IOHEXOL 240 MG/ML IJ SOLN
50.0000 mL | Freq: Once | INTRAMUSCULAR | Status: AC | PRN
Start: 2012-10-08 — End: 2012-10-08
  Administered 2012-10-08: 50 mL via ORAL

## 2012-10-08 MED ORDER — IOHEXOL 350 MG/ML IV SOLN
100.0000 mL | Freq: Once | INTRAVENOUS | Status: AC | PRN
Start: 2012-10-08 — End: 2012-10-08
  Administered 2012-10-08: 100 mL via INTRAVENOUS

## 2012-10-08 MED ORDER — HYDROMORPHONE HCL PF 1 MG/ML IJ SOLN
0.5000 mg | Freq: Once | INTRAMUSCULAR | Status: AC
Start: 2012-10-08 — End: 2012-10-08
  Administered 2012-10-08: 0.5 mg via INTRAVENOUS
  Filled 2012-10-08: qty 1

## 2012-10-08 MED ORDER — BISACODYL 10 MG RE SUPP
10.0000 mg | Freq: Once | RECTAL | Status: DC
Start: 2012-10-08 — End: 2012-10-08

## 2012-10-08 MED ORDER — FLEET ENEMA 7-19 GM/118ML RE ENEM
1.0000 | ENEMA | Freq: Once | RECTAL | Status: AC
Start: 2012-10-08 — End: 2012-10-08
  Administered 2012-10-08: 1 via RECTAL

## 2012-10-08 MED ORDER — NALOXONE HCL 0.4 MG/ML IJ SOLN
0.1000 mg | INTRAMUSCULAR | Status: DC | PRN
Start: 2012-10-08 — End: 2012-10-08

## 2012-10-08 NOTE — Progress Notes (Signed)
PROGRESS NOTE    Date Time: 10/08/2012 6:45 PM  Patient Name: [name], [name]    Chief Complaint:   NV 2/2 diabetic gastroparesis    Assessment and Plan:   5M with poorly controlled DM and associated gastroparesis with intractable NV.    1) Gastroparesis  Increase Reglan to 10mg  IV tid for symptomatic mgmt  Continue Erythromycin  Continue frequent small low fat meals within diabetic diet  Tight glucose control should help as well    2) Constipation   -Fleet enema and dulcolax suppository today  -Consider RElistor as an outpatient     3) GERD  Continue PPI bid and Carafate    2) DM type 2, insulin dependent    5) Abdominal Pain with NV  -Avoid narcotics which will slow GI motility and worsen constipation  -CT A/P to assess ppersistent pain  -Phenergan 12.5mg  every 6 hours scheduled      Subjective:   Pt. Continues to have nausea and vomiting, but slightly better than yesterday. Pt. Instructed to avoid narcotics, as this appears to make him vomit 1-2 hours after administration. He has not had a bowel movement and refused enemas last night because he "felt too sick". He denies GI bleeding, but states that abdomen still hurts "all over".     Medications:     Current Facility-Administered Medications   Medication Dose Route Frequency   . amLODIPine  5 mg Oral Daily   . [COMPLETED] bisacodyl  10 mg Rectal Once   . cloNIDine  0.1 mg Oral Q12H SCH   . erythromycin  250 mg Intravenous Q8H   . heparin (porcine)  5,000 Units Subcutaneous Q8H SCH   . [COMPLETED] hydrALAZINE  10 mg Intravenous Once   . [COMPLETED] HYDROmorphone  0.5 mg Intravenous Once   . [COMPLETED] HYDROmorphone  0.5 mg Intravenous Once   . insulin glargine  20 Units Subcutaneous QHS   . lidocaine  1 patch Transdermal Q24H   . lisinopril  20 mg Oral Daily   . metoclopramide  10 mg Intravenous TID   . ondansetron  4 mg Intravenous Q8H   . pantoprazole  40 mg Oral Q12H SCH   . promethazine  12.5 mg Intravenous Q6H   . sodium phosphate  1 enema Rectal Once   .  sodium phosphate  1 enema Rectal Once   . [COMPLETED] sodium phosphate  1 enema Rectal Once   . sucralfate  1 g Oral QID   . [DISCONTINUED] bisacodyl  10 mg Rectal Once   . [DISCONTINUED] metoclopramide  5 mg Intravenous Q6H SCH       Review of Systems:   Complete 12 Point ROS negative or normal except those mentioned in HPI above.      Physical Exam:     Filed Vitals:    10/08/12 1043   BP: 178/99   Pulse: 94   Temp:    Resp: 16   SpO2: 98%   Temp: 98.0    General appearance: +ill appearing  Eyes: Sclera anicteric, pink conjunctivae, no ptosis  ENMT: mucous membranes moist, nose and ears appear normal.  Oropharynx clear.  Chest: Non labored respirations, no audible wheezing, no clubbing or cyanosis  CV:  Regular rate and rhythm, no JVD, no LE edema  Abdomen: obese, palpation not performed due to NV, non-distended  Skin: Normal color and turgor, no rashes, no suspicious skin lesions noted  Neuro: CN II-XII grossly intact.  No gross movement disorders noted.  Mental status: Appropriate  affect, alert and oriented x 3      Labs:     Recent Labs   [facility] 10/08/12 0339 10/07/12 0326    WBC 8.15 7.25    HGB 12.6* 12.5*    HCT 38.4* 38.0*    PLT 230 180    MCV 80.5 81.7       Recent Labs   Basename 10/08/12 0339 10/07/12 0326 10/07/12 0311    NA 139 -- 137    K 4.0 -- 4.5    CL 106 -- 110*    CO2 26 -- 22    BUN 11.0 -- 13.0    CREAT 1.1 -- 1.1    GLU 184* -- 198*    CA 8.5 -- 8.1*    MG 1.8 1.8 --    PHOS 4.0 3.2 --       Recent Labs   [facility] 10/05/12 1927    AST 13    ALT 15    ALKPHOS 97    BILITOTAL 0.7    BILIDIRECT --    PROT 7.3    ALB 4.1       No results found for this basename: PTT:2,PT:2,INR:2 in the last 72 hours      Rads:   Radiological Procedure reviewed.  AXR/CXR 10/05/12:   1. No acute cardiopulmonary disease.   2. No ileus or obstruction.  3. Constipation.        Endoscopy:       Pathology:

## 2012-10-08 NOTE — Progress Notes (Signed)
------------------------------------------------------------------------------------------------------------------    [facility] Attending Physician Addendum / Note    This patient was seen / examined / discussed with Dr. Army Melia.  Physical exam duplicated.  I agree with assessment and plan as outlined, with any additions or revisions below.     - A/P CT scan  - IVF / antiemetics  - Standing tramadol  - pn control: d/c opiates - encouraged pt to ask for Tramadol for breakthrough pain  - consider low-dose naloxone if cannot tolerate being off opiates  - E-mycin / Reglan  - standing zofran / phenergan (in addition to PRN)  - await further per GI consult

## 2012-10-08 NOTE — Plan of Care (Addendum)
Problem: Pain  Goal: Patient's pain/discomfort is manageable  Outcome: Progressing  A&O x3. Pt c/o 6-8/10 epi-gastric pain. Dilaudid IV [place]; MD emphasized new pain regiment for tramadol PO Q6 while pt is constipated. Pt stated, "I don't know why they are taking away the pain medication that works for me. That other stuff (tramadol, lidocaine patch) doesn't work." Pt educated about pain managedment, asking for prn medications before pain intensifies, etc. Abd soft, +flatus, -BM x2 days, BS hyperactive. 1 episode 50 mL clear, yellow emesis this am. Scheduled IV zofran, phenergan, and reglan administered with good result. Pt scheduled for CT pelvis with contrast to r/o SBO, awaiting PO contrast. Suppository administered PR. Fleet enema scheduled. Will continue to monitor GI and manage pain.     1600-Fleet enema administered with scant amount diarrhea. Noyes MD notified and no additional orders. Pt c/o 6/10 pain after episode of emesis. [place] MD notified and new order for dilaudid IV x1. Will continue to encourage PO contrast for scheduled CT (pelvis).

## 2012-10-08 NOTE — Student Progress (Signed)
FAMILY MEDICINE PROGRESS NOTE    Date Time: 10/08/2012 6:12 AM  Patient Name: [name]  Attending Physician: Darrol Jump, MD  Team Contact Info: [employer] [phone], after 5pm [phone]     Assessment: [name] is a 37 y.o. male with history of DM2, gastroparesis, and HTN who presented with intractable vomiting 2/2 to acute exacerbation of gastroparesis. Had difficulty tolerating po yesterday and is doing better but now with elevated pressures likely to medical non-compliance.    Plan:  #Gastroparesis   - Acute exacerbation of chronic issue. Still has difficulty tolerating po.  - AXR on at time of admission excluded ileus. Also, LFTs and lipase were normal making pancreatitis or hepatobiliary process less likely.   - Appreciate GI consult  - Increased Reglan from 5 mg IV q6h to 10 mg.  - Continue Erythromycin 250mg  IV q8h   - Continue 4-6 small diabetic, low fat/fiber meals   - Continue prn Zofran 4 mg IV q8h and Phenergan 6.25 mg IV q6h in addition to scheduled Zofran 4 mg IV q8h and Phenergan 12.5 mg IV q6h  - Transition medications from IV to po as patient symptoms improve  - Recheck EKG to evaluate QTc  - CT abd/pelvis to re-evaluate for pelvis     #Abdominal Pain   - Likely secondary to intractable n/v 2/2 gastroparesis. Also has history of gastric pressure - possibly gas.  - Has prn Dilaudid still although dose was decreased yesterday. D/c opioids today.  - Increase prn Ultram to 100 mg po q6h for pain  - Strongly discourage use of Dilaudid and encouraged to avoid opioids given gastroparesis and constipation   - Continue Lidoderm patch   - If pain continues despite today's changes in pain meds, consider Relistor, which selectively antagonizes peripheral mu-opioid receptors, or naloxone to avoid opioid-induced GI hypomotility   - Continue simethicone for gas relief    #DM2, insulin dependent   - Claims to be compliant with insulin regimen. A1c = 9.3. Microalbinuria low.   - Currently on Lantus 20  U qHS (per records takes 36 U at home, which is consistent with 0.3 units/kg) and required only 4 U SSI in past 24 hours.  - Can consider increasing Lantus with improvement of po intake  - Continue prn SSI with POCT glucose with meals    #HTN   - Pressures elevated upon presenting to ED and improved when patient was taking medications, but now BP elevated 2/2 medical non-compliance due to severe nausea yesterday   - Continue home Norvasc 5 mg po daily, clonidine 0.1 mg q 12h, and lisinopril 20 mg po qd  - Continue to monitor     #AKI   - Likely 2/2 dehydration/vomiting   - Improved. Baseline Cr =1, currently stable at 1.1   - On IVF - NS @ 50 cc/hr. Increase today as patient was unable to tolerate po well yesterday.     #GERD   - Chronic with history of gastritis and EGD on 08/08/12 negative for H. pylori and intestinal metaplasia  - Appreciate GI consult   - Continue Protonix and Carafate although both can interact if taken at the same time, so PPI should be taken 30 minutes prior to taking Carafate.     #GI/FEN - 4-6 small diabetic, low fat, low fiber meals after CT today  #PPx - Heparin 5000 SC q8h (can consider switching to Lovenox as Cr improves)   #Dispo - d/c home once n/v improves and  tolerating po well    Subjective:   [name] refused his po medications yesterday due to nausea and abdominal pain. Pressures have thus been elevated. Claims that pain relieved only with Dilaudid. Claims that Lidoderm patch and Ultram did not help much with the pain; however, is willing to try higher dose of Ultram. Vomited yesterday evening and did not eat much yesterday. Tolerated sips of ginger ale. Currently denies n/v, f/c, pain, SOB.     Meds:  Scheduled Meds:  Current Facility-Administered Medications   Medication Dose Route Frequency   . amLODIPine  5 mg Oral Daily   . bisacodyl  10 mg Rectal Once   . cloNIDine  0.1 mg Oral Q12H SCH   . erythromycin  250 mg Intravenous Q8H   . heparin (porcine)  5,000 Units  Subcutaneous Q8H SCH   . [COMPLETED] hydrALAZINE  10 mg Intravenous Once   . [COMPLETED] HYDROmorphone  0.5 mg Intravenous Once   . insulin glargine  20 Units Subcutaneous QHS   . lidocaine  1 patch Transdermal Q24H   . lisinopril  20 mg Oral Daily   . [COMPLETED] magnesium sulfate  1 g Intravenous Once   . metoclopramide  5 mg Intravenous Q6H SCH   . ondansetron  4 mg Intravenous Q8H   . pantoprazole  40 mg Oral Q12H SCH   . promethazine  12.5 mg Intravenous Q6H   . sodium phosphate  1 enema Rectal Once   . sodium phosphate  1 enema Rectal Once   . sucralfate  1 g Oral QID   . [DISCONTINUED] insulin glargine  18 Units Subcutaneous QHS   . [DISCONTINUED] ondansetron  8 mg Oral Q8H   . [DISCONTINUED] pantoprazole  40 mg Intravenous Daily   . [DISCONTINUED] promethazine  6.25 mg Intravenous Q6H   . [DISCONTINUED] promethazine  6.25 mg Oral Q6H   . [DISCONTINUED] sucralfate  1 g Oral TID   Continuous Infusions:       . sodium chloride 50 mL/hr (10/07/12 0815)   PRN Meds:.acetaminophen, dextrose, dextrose, HYDROmorphone, insulin aspart, simethicone, traMADol, [DISCONTINUED] HYDROmorphone, [DISCONTINUED] ondansetron, [DISCONTINUED] promethazine    Objective:  Temp:  [97.8 F (36.6 C)-98.3 F (36.8 C)] 98.1 F (36.7 C)  Heart Rate:  [75-105] 90   Resp Rate:  [17-18] 18   BP: (150-198)/(83-98) 154/83 mmHg    Intake/Output Summary (Last 24 hours) at 10/08/12 4098  Last data filed at 10/07/12 0815   Gross per 24 hour   Intake      0 ml   Output    100 ml   Net   -100 ml       General: Obese Black male in NAD laying comfortably in bed   HEENT: Mucus membranes moist   CV: 2+ pulses, S1S2 RRR with no m/r/g noted   Lungs: CTAB no wheezes, rhonchi, rales   Abdomen: Obese, normoactive bowel sounds, soft, not distended, nontender this morning but expresses that he is sore when palpating epigastric area, no hepatosplenomegaly noted   Extremities: Trace bilateral pedal edema, no clubbing or cyanosis     Labs:  Results      Procedure Component Value Units Date/Time    Phosphorus [[id number]] Collected:10/08/12 0339    Specimen Information:Blood Updated:10/08/12 0414     Phosphorus 4.0 mg/dL     Basic Metabolic Panel [[id number]]  (Abnormal) Collected:10/08/12 0339    Specimen Information:Blood Updated:10/08/12 0414     Glucose 184 (H) mg/dL      BUN 11.0  mg/dL      Creatinine 1.1 mg/dL      CALCIUM 8.5 mg/dL      Sodium 161      Potassium 4.0      Chloride 106      CO2 26     GFR [[id number]] Collected:10/08/12 0339     EGFR >60.0 Updated:10/08/12 0414    Magnesium [[id number]] Collected:10/08/12 0339    Specimen Information:Blood Updated:10/08/12 0414     Magnesium 1.8 mg/dL     CBC with differential [[id number]]  (Abnormal) Collected:10/08/12 0339    Specimen Information:Blood / Blood Updated:10/08/12 0405     WBC 8.15      RBC 4.77      Hgb 12.6 (L) g/dL      Hematocrit 57.8 (L) %      MCV 80.5 fL      MCH 26.4 (L) pg      MCHC 32.8 g/dL      RDW 12 %      Platelets 230      MPV 9.8 fL      Neutrophils 75 %      Lymphocytes Automated 20 %      Monocytes 4 %      Eosinophils Automated 0 %      Basophils Automated 0 %      Immature Granulocyte 0 %      Nucleated RBC 0      Neutrophils Absolute 6.13      Abs Lymph Automated 1.63      Abs Mono Automated 0.36      Abs Eos Automated 0.00      Absolute Baso Automated 0.01      Absolute Immature Granulocyte 0.02     POCT glucose (Q4H) [[id number]]  (Abnormal) Collected:10/08/12 0341     POCT Glucose WB 152 (A) mg/dL UXLKGMW:12/09/23 3664    POCT Glucose [[id number]]  (Abnormal) Collected:10/07/12 2158     POCT Glucose WB 184 (A) mg/dL DGLOVFI:43/32/95 1884    POCT glucose (Q4H) [[id number]]  (Abnormal) Collected:10/07/12 1703     POCT Glucose WB 199 (A) mg/dL WFUXNAT:55/73/22 0254    POCT glucose (Q4H) [[id number]]  (Abnormal) Collected:10/07/12 1121     POCT Glucose WB 172 (A) mg/dL GBTDVVO:16/07/37 1062    POCT glucose (Q4H) [[id number]]  (Abnormal) Collected:10/07/12 0744     POCT Glucose WB 157  (A) mg/dL OJJKKXF:81/82/99 3716    Basic Metabolic Panel [[id number]]  (Abnormal) Collected:10/07/12 0311    Specimen Information:Blood Updated:10/07/12 0756     Glucose 198 (H) mg/dL      BUN 17.5 mg/dL      Creatinine 1.1 mg/dL      CALCIUM 8.1 (L) mg/dL      Sodium 102      Potassium 4.5      Chloride 110 (H)      CO2 22     GFR [[id number]] Collected:10/07/12 0311     EGFR >60.0 Updated:10/07/12 0756          Imaging:  Abdomen 2 View With Chest 1 View    10/05/2012    1. No acute cardiopulmonary disease.  2. No ileus or obstruction. 3. Constipation.  Collene Schlichter, MD  10/05/2012 7:03 PM       Case discussed with: Dr. Army Melia and Dr. Anselm Jungling    Signed by: Terrance Mass, MS4

## 2012-10-08 NOTE — Progress Notes (Signed)
FAMILY MEDICINE PROGRESS NOTE    Date Time: 10/08/2012 6:16 AM  Patient Name: [name]  Attending Physician: Darrol Jump, MD  Team Contact Info: [facility] [phone] 8 am -5 pm: After hours [phone]      Assessment:     Active Hospital Problems    Diagnosis   . Intractable nausea and vomiting   . DM (diabetes mellitus), type 1, uncontrolled with complications   . HTN (hypertension)   . Gastroparesis       37 yo with h/o gastroparesis likely 2/2 uncontrolled Diabetes, HTN, presents with intractable vomiting 2/2 gastroparesis no evidence of ileus on Xray with persistent vomiting    Plan:   # Vomiting 2/2 Gastroparesis   - EGD on 08/08/12 no evidence of gastric outlet obstruction, diffuse gastropathy was present    Irregular squamocolumnar junction - repeat EGD in 3 years , H pylori negative   - Appreciate  [facility] ( Dr. 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 Anselm Jungling. Objective data reviewed. Dilaudid x 4 overnight. Hypertensive requiring hydralazine. Did not  receive scheduled BP medications. Pain improved with lidocaine patch but uncontrolled. Small episodes of emesis.    Review of Systems:   Denies fevers, chills      Physical Exam:       VITAL SIGNS PHYSICAL EXAM   Temp:  [97.8 F (36.6 C)-98.3 F (36.8 C)] 98.1 F (36.7 C)  Heart Rate:  [75-105] 90   Resp Rate:  [17-18] 18   BP: (150-198)/(83-98) 154/83 mmHg       Intake/Output Summary (Last 24 hours) at 10/08/12 0616  Last data filed at 10/07/12 0815   Gross per 24 hour   Intake      0 ml   Output    100 ml   Net   -100 ml    Physical Exam  Gen: AAOx3 NAD  HEENT: MMM  Cardiac:RRR no MRG   Lungs: CTAB  Abdomen: + BS soft NT, ND   Ext: +2 DP, no clubbing cyanosis edema          Meds:     Medications were reviewed.    Labs:       Labs (last 72 hours):      Lab 10/08/12 0339 10/07/12  0326   WBC 8.15 7.25   HGB 12.6* 12.5*   HCT 38.4* 38.0*   PLT 230 180       No results found for this basename: PT:2,INR:2,PTT:2 in the last 168 hours   Lab 10/08/12 0339 10/07/12 0311 10/05/12 1927   NA 139 137 --   K 4.0 4.5 --   CL 106 110* --   CO2 26 22 --   BUN 11.0 13.0 --   CREAT 1.1 1.1 --   CA 8.5 8.1* --   ALB -- -- 4.1   PROT -- -- 7.3   BILITOTAL -- -- 0.7   ALKPHOS -- -- 97   ALT -- -- 15   AST -- -- 13   GLU 184* 198* --     HbA1C: 9.3             Microbiology, reviewed and are significant for:  None      Imaging personally reviewed, including:   AXR: no ileus     Case discussed with:Dr. Ho    Signed by: Kristine Linea, MD PGY-2  Pager # [phone]  [facility]   [address]   [place], [place] [address]

## 2012-10-08 NOTE — Progress Notes (Addendum)
Pt alert and oriented x4. Patient continues to have intermittant nausea and vomiting despite scheduled Phenergan and zofran. Poor PO intake, sips of Gingerale as tolerated.  Nausea worsened by c/o abdominal pain. Pain persists with IV Dilaudid 0.5q4 and Tramadol. MD aware, additional one time dose IV Dilaudid 0.5mg  ordered providing some pain relief.  BP remains elevated after dose IV hydralazine, MD aware, no further interventions ordered.Will continue to monitor.

## 2012-10-09 LAB — ECG 12-LEAD
Atrial Rate: 91 {beats}/min
P Axis: 57 degrees
P-R Interval: 168 ms
Q-T Interval: 372 ms
QRS Duration: 88 ms
QTC Calculation (Bezet): 457 ms
R Axis: 37 degrees
T Axis: 37 degrees
Ventricular Rate: 91 {beats}/min

## 2012-10-09 LAB — POCT GLUCOSE
Whole Blood Glucose POCT: 168 mg/dL — AB (ref 70–100)
Whole Blood Glucose POCT: 183 mg/dL — AB (ref 70–100)
Whole Blood Glucose POCT: 160 mg/dL — AB (ref 70–100)
Whole Blood Glucose POCT: 214 mg/dL — AB (ref 70–100)

## 2012-10-09 MED ORDER — HYDRALAZINE HCL 20 MG/ML IJ SOLN
10.0000 mg | Freq: Four times a day (QID) | INTRAMUSCULAR | Status: DC | PRN
Start: 2012-10-09 — End: 2012-10-10

## 2012-10-09 MED ORDER — LORAZEPAM 2 MG/ML IJ SOLN
1.0000 mg | Freq: Three times a day (TID) | INTRAMUSCULAR | Status: DC | PRN
Start: 2012-10-09 — End: 2012-10-10
  Administered 2012-10-10: 1 mg via INTRAVENOUS
  Filled 2012-10-09: qty 1

## 2012-10-09 MED ORDER — HYDROMORPHONE HCL PF 1 MG/ML IJ SOLN
0.5000 mg | INTRAMUSCULAR | Status: DC | PRN
Start: 2012-10-09 — End: 2012-10-10
  Administered 2012-10-09 (×2): 0.5 mg via INTRAVENOUS
  Filled 2012-10-09 (×2): qty 1

## 2012-10-09 MED ORDER — DIPHENHYDRAMINE HCL 50 MG/ML IJ SOLN
25.0000 mg | Freq: Four times a day (QID) | INTRAMUSCULAR | Status: AC
Start: 2012-10-09 — End: 2012-10-10
  Administered 2012-10-09 – 2012-10-10 (×4): 25 mg via INTRAVENOUS
  Filled 2012-10-09 (×3): qty 1

## 2012-10-09 MED ORDER — DIPHENHYDRAMINE HCL 50 MG/ML IJ SOLN
12.5000 mg | Freq: Four times a day (QID) | INTRAMUSCULAR | Status: DC | PRN
Start: 2012-10-09 — End: 2012-10-10
  Administered 2012-10-10: 12.5 mg via INTRAVENOUS
  Filled 2012-10-09: qty 1

## 2012-10-09 MED ORDER — AMLODIPINE BESYLATE 5 MG PO TABS
10.0000 mg | ORAL_TABLET | Freq: Every day | ORAL | Status: DC
Start: 2012-10-09 — End: 2012-10-10
  Administered 2012-10-09 – 2012-10-10 (×2): 10 mg via ORAL
  Filled 2012-10-09 (×2): qty 2

## 2012-10-09 MED ORDER — INSULIN GLARGINE 100 UNIT/ML SC SOLN
15.0000 [IU] | Freq: Every evening | SUBCUTANEOUS | Status: DC
Start: 2012-10-09 — End: 2012-10-10
  Administered 2012-10-09: 15 [IU] via SUBCUTANEOUS
  Filled 2012-10-09: qty 150

## 2012-10-09 MED ORDER — INSULIN GLARGINE 100 UNIT/ML SC SOLN
22.0000 [IU] | Freq: Every evening | SUBCUTANEOUS | Status: DC
Start: 2012-10-09 — End: 2012-10-09

## 2012-10-09 MED ORDER — METHYLPREDNISOLONE SODIUM SUCC 125 MG IJ SOLR
60.0000 mg | Freq: Once | INTRAMUSCULAR | Status: AC
Start: 2012-10-09 — End: 2012-10-09
  Administered 2012-10-09: 60 mg via INTRAVENOUS
  Filled 2012-10-09: qty 0.96

## 2012-10-09 MED ORDER — FLEET ENEMA 7-19 GM/118ML RE ENEM
1.0000 | ENEMA | Freq: Once | RECTAL | Status: AC
Start: 2012-10-09 — End: 2012-10-09
  Administered 2012-10-09: 1 via RECTAL
  Filled 2012-10-09: qty 1

## 2012-10-09 MED ORDER — LORAZEPAM 2 MG/ML IJ SOLN
0.5000 mg | Freq: Four times a day (QID) | INTRAMUSCULAR | Status: DC
Start: 2012-10-09 — End: 2012-10-10
  Administered 2012-10-09 – 2012-10-10 (×6): 0.5 mg via INTRAVENOUS
  Filled 2012-10-09 (×5): qty 1

## 2012-10-09 NOTE — Progress Notes (Signed)
Remains A & O x 4. Vomited multiple times. Small amount of yellowish emesis. Antiemetics given as scheduled.  Continues on pain mgt. PRN Dilaudid 0.5ml and Tramadol 2 tabs 100 mg given for C/O abdominal pain. No respiratory issues observed. Will continue to monitor.

## 2012-10-09 NOTE — Student Progress (Signed)
FAMILY MEDICINE PROGRESS NOTE    Date Time: 10/09/2012 6:27 AM  Patient Name: [name]  Attending Physician: Darrol Jump, MD  Team Contact Info: [employer] [phone], after 5pm [phone]     Assessment: NAMAN SPYCHALSKI is a 37 y.o. male with history of DM2, gastroparesis, and HTN who presented with intractable vomiting 2/2 to gastroparesis. Continues to vomit and c/o abdominal pain requiring opioids.     Plan:  #Continued nausea and vomiting likely 2/2 gastroparesis, but possible component of cyclical vomiting    - Still has difficulty tolerating po.  - AXR on at time of admission and CT excluded ileus. Also, LFTs and lipase were normal making pancreatitis or hepatobiliary process less likely.   - Appreciate GI recommendations  - Continue Reglan 10 mg IV TID, Erythromycin 250mg  IV q8h, and scheduled antiemetics   - Transition medications from IV to po as patient symptoms improve  - Recheck EKG to evaluate QTc. QTc yesterday was 457.  - Will treat patient for cyclical vomiting with Ativan and Benadryl     #Abdominal Pain   - Likely secondary to intractable n/v 2/2 gastroparesis or cyclical vomiting  - Continue Ultram to 100 mg po q6h prn for pain  - Continue Lidoderm patch   - Continue simethicone for gas relief  - Consider Relistor, which selectively antagonizes peripheral mu-opioid receptors, as an out-patient  - Avoid opioids given gastroparesis and constipation     #DM2, insulin dependent   - Claims to be compliant with insulin regimen. A1c = 9.3. Microalbinuria low.   - Currently on Lantus 15 U qHS (per records takes 36 U at home, which is consistent with 0.3 units/kg) and required only 2 U SSI in past 24 hours.  - Continue prn SSI with POCT glucose with meals    #HTN   - Pressures elevated upon presenting to ED and pressures continue to be elevated. Pain likely causing increase in BP.   - Continue home clonidine 0.1 mg q 12h, and lisinopril 20 mg po qd  - Will increase Norvasc from 5 to 10 mg po qd  -  Continue to monitor     #AKI   - Likely 2/2 dehydration/vomiting   - Improved. Baseline Cr =1, currently stable at 1.1   - On IVF - NS @ 100 cc/hr.     #GERD   - Chronic with history of gastritis and EGD on 08/08/12 negative for H. pylori and intestinal metaplasia  - Appreciate GI consult   - Continue Protonix and Carafate although both can interact if taken at the same time, so PPI should be taken 30 minutes prior to taking Carafate.     #GI/FEN - NPO for now   #PPx - Heparin 5000 SC q8h   #Dispo - d/c home once n/v improves and tolerating po well    Subjective:   Mr. Mcaffee continues to complain of abdominal pain and claims that this is the cause of his vomiting. Upset that he is unable to get his Dilaudid. Claims that he only takes 2-3 oxycodone per week for knee and abdominal pain. Reports that he received prescription from his PCP, Dr. Alben Spittle. Vomited "bile" last night and this morning, unable to recall number of times. Denies chest pain, fever, chills, diarrhea, constipation (last BM yesterday but required FLEET enema), SOB, HAs.     Meds:  Scheduled Meds:  Current Facility-Administered Medications   Medication Dose Route Frequency   . amLODIPine  5 mg Oral Daily   . [  COMPLETED] bisacodyl  10 mg Rectal Once   . cloNIDine  0.1 mg Oral Q12H SCH   . erythromycin  250 mg Intravenous Q8H   . heparin (porcine)  5,000 Units Subcutaneous Q8H SCH   . [COMPLETED] HYDROmorphone  0.5 mg Intravenous Once   . insulin glargine  20 Units Subcutaneous QHS   . lidocaine  1 patch Transdermal Q24H   . lisinopril  20 mg Oral Daily   . metoclopramide  10 mg Intravenous TID   . ondansetron  4 mg Intravenous Q8H   . pantoprazole  40 mg Oral Q12H SCH   . promethazine  12.5 mg Intravenous Q6H   . sodium phosphate  1 enema Rectal Once   . sodium phosphate  1 enema Rectal Once   . [COMPLETED] sodium phosphate  1 enema Rectal Once   . sucralfate  1 g Oral QID   . [DISCONTINUED] bisacodyl  10 mg Rectal Once   Continuous Infusions:       .  sodium chloride 50 mL/hr at 10/08/12 2306   PRN Meds:.acetaminophen, dextrose, dextrose, HYDROmorphone, insulin aspart, [COMPLETED] iohexol, [COMPLETED] iohexol, simethicone, traMADol, [DISCONTINUED] HYDROmorphone, [DISCONTINUED] naloxone, [DISCONTINUED] traMADol    Objective:  Temp:  [97.6 F (36.4 C)-98.1 F (36.7 C)] 98.1 F (36.7 C)  Heart Rate:  [85-100] 85   Resp Rate:  [16-18] 16   BP: (158-178)/(84-99) 161/91 mmHg    Intake/Output Summary (Last 24 hours) at 10/09/12 1610  Last data filed at 10/08/12 1500   Gross per 24 hour   Intake      0 ml   Output     75 ml   Net    -75 ml       General: Obese Black male in NAD, not wanting to speak or interact in conversation, vomited thick dark pea-green/yellow fluid  HEENT: Mucus membranes moist   CV: 2+ pulses, S1S2 RRR with no m/r/g noted   Lungs: CTAB no wheezes, rhonchi, rales   Abdomen: Obese, normoactive bowel sounds, soft, not distended, claims to be tender with palpation over epigastric area  Extremities: No edema, clubbing or cyanosis     Labs:  Results     Procedure Component Value Units Date/Time    POCT Glucose [[id number]]  (Abnormal) Collected:10/08/12 2134     POCT Glucose WB 195 (A) mg/dL [id number]:29/56/21 3086    POCT glucose (Q4H) [[id number]]  (Abnormal) Collected:10/08/12 1700     POCT Glucose WB 213 (A) mg/dL BMWUXLK:44/01/02 7253    POCT glucose (Q4H) [[id number]]  (Abnormal) Collected:10/08/12 1130     POCT Glucose WB 145 (A) mg/dL [id number]:75/64/33 2951    POCT glucose (Q4H) [[id number]]  (Abnormal) Collected:10/08/12 0730     POCT Glucose WB 138 (A) mg/dL KZSWFUX:32/35/57 3220          Imaging:  Ct Abdomen Pelvis W Iv And Po Contrast    10/08/2012   1. No localizing inflammatory changes to correlate with the patient's pain. Normal appendix. 2. Other findings as above.  Jasmine December  D'Heureux, MD  10/08/2012 10:28 PM     Abdomen 2 View With Chest 1 View    10/05/2012    1. No acute cardiopulmonary disease.  2. No ileus or obstruction. 3. Constipation.   Collene Schlichter, MD  10/05/2012 7:03 PM       Case discussed with: Dr. Army Melia and Dr. Anselm Jungling    Signed by: Terrance Mass, MS4

## 2012-10-09 NOTE — Progress Notes (Signed)
------------------------------------------------------------------------------------------------------------------    [facility] Attending Physician Addendum / Note    This patient was seen / examined / discussed with Dr. Army Melia.  Physical exam duplicated.  I agree with assessment and plan as outlined, with any additions or revisions below.     - IVF / antiemetics  - Standing tramadol  - pn control: Tramadol ; avoid opiates  - consider low-dose naloxone if cannot tolerate being off opiates  - E-mycin / Reglan  - standing zofran / phenergan (in addition to PRN)  - await further per GI consult; consider domperidone protocol

## 2012-10-09 NOTE — Progress Notes (Signed)
FAMILY MEDICINE PROGRESS NOTE    Date Time: 10/09/2012 7:14 AM  Patient Name: [name]  Attending Physician: Darrol Jump, MD  Team Contact Info: [facility] [phone] 8 am -5 pm: After hours [phone]      Assessment:     Active Hospital Problems    Diagnosis   . Intractable nausea and vomiting   . DM (diabetes mellitus), type 1, uncontrolled with complications   . HTN (hypertension)   . Gastroparesis       37 yo with h/o gastroparesis likely 2/2 uncontrolled Diabetes, HTN, presents with intractable vomiting 2/2 gastroparesis no evidence of ileus on Xray with persistent vomiting    Plan:   # Vomiting 2/2 Gastroparesis   - EGD on 08/08/12 no evidence of gastric outlet obstruction, diffuse gastropathy was present    Irregular squamocolumnar junction - repeat EGD in 3 years , H pylori negative   - Appreciate  [facility] ( Dr. 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 Anselm Jungling. Objective data reviewed. Pain uncontrolled. Patient vomiting 15 episodes small amount daily     Review of Systems:   Denies  fevers, chills      Physical Exam:       VITAL SIGNS PHYSICAL EXAM   Temp:  [97.6 F (36.4 C)-98.1 F (36.7 C)] 98.1 F (36.7 C)  Heart Rate:  [85-94] 85   Resp Rate:  [16-18] 16   BP: (161-178)/(84-99) 161/91 mmHg       Intake/Output Summary (Last 24 hours) at 10/09/12 0714  Last data filed at 10/08/12 1500   Gross per 24 hour   Intake      0 ml   Output     75 ml   Net    -75 ml    Physical Exam  Gen: AAOx3 NAD  HEENT: MMM  Cardiac:RRR no MRG   Lungs: CTAB  Abdomen: + BS soft NT, ND   Ext: +2 DP, no clubbing cyanosis edema          Meds:     Medications were reviewed.    Labs:       Labs (last 72 hours):      Lab 10/08/12 0339 10/07/12 0326   WBC 8.15 7.25   HGB 12.6* 12.5*   HCT 38.4* 38.0*   PLT 230 180  No results found for this basename: PT:2,INR:2,PTT:2 in the last 168 hours   Lab 10/08/12 0339 10/07/12 0311 10/05/12 1927   NA 139 137 --   K 4.0 4.5 --   CL 106 110* --   CO2 26 22 --   BUN 11.0 13.0 --   CREAT 1.1 1.1 --   CA 8.5 8.1* --   ALB -- -- 4.1   PROT -- -- 7.3   BILITOTAL -- -- 0.7   ALKPHOS -- -- 97   ALT -- -- 15   AST -- -- 13   GLU 184* 198* --     HbA1C: 9.3             Microbiology, reviewed and are significant for:  None      Imaging personally reviewed, including:   AXR: no ileus   CT scan: Small hiatal hernia, no evidence of obstruction    Case discussed with:Dr. Ho    Signed by: Kristine Linea, MD PGY-2  Pager # [phone]  [facility]   [address]   [place], [place] [address]

## 2012-10-09 NOTE — Progress Notes (Signed)
PROGRESS NOTE    Date Time: 10/09/2012 8:22 PM  Patient Name: [name], [name]    Chief Complaint:   NV 2/2 diabetic gastroparesis    Assessment and Plan:   21M with poorly controlled DM and associated gastroparesis with intractable NV.    1) Gastroparesis. He could have component of cyclic vomiting syndrome  -Ativan 1mg  with benadryl together if continued vomiting. Nurse informed of this administration  - Reglan to 10mg  IV tid for symptomatic mgmt  -Continue Erythromycin  -frequent small low fat meals within diabetic diet  -Tight glucose control should help as well    2) Constipation with narcotic gut  -Prn Miralax  -Consider Relistor as an outpatient     3) GERD  Continue PPI bid and Carafate    2) DM type 2, insulin dependent    5) Abdominal Pain with NV  -Avoid narcotics which will slow GI motility and worsen constipation  -Phenergan 12.5mg  every 6 hours scheduled      Subjective:   He continues to vomit throughout the day, but eat day frequency is less. He continues to have abdominal pain. He did have a bowel movement yesterday. He remains persistently drowsy    Medications:     Current Facility-Administered Medications   Medication Dose Route Frequency   . amLODIPine  10 mg Oral Daily   . cloNIDine  0.1 mg Oral Q12H SCH   . diphenhydrAMINE  25 mg Intravenous Q6H SCH   . erythromycin  250 mg Intravenous Q8H   . heparin (porcine)  5,000 Units Subcutaneous Q8H SCH   . insulin glargine  15 Units Subcutaneous QHS   . lidocaine  1 patch Transdermal Q24H   . lisinopril  20 mg Oral Daily   . LORazepam  0.5 mg Intravenous Q6H SCH   . [COMPLETED] methylprednisolone  60 mg Intravenous Once   . metoclopramide  10 mg Intravenous TID   . ondansetron  4 mg Intravenous Q8H   . pantoprazole  40 mg Oral Q12H SCH   . promethazine  12.5 mg Intravenous Q6H   . [COMPLETED] sodium phosphate  1 enema Rectal Once   . sucralfate  1 g Oral QID   . [DISCONTINUED] amLODIPine  5 mg Oral Daily   . [DISCONTINUED] insulin glargine  20 Units  Subcutaneous QHS   . [DISCONTINUED] insulin glargine  22 Units Subcutaneous QHS   . [DISCONTINUED] sodium phosphate  1 enema Rectal Once   . [DISCONTINUED] sodium phosphate  1 enema Rectal Once       Review of Systems:   Complete 12 Point ROS negative or normal except those mentioned in HPI above.      Physical Exam:     Filed Vitals:    10/09/12 1932   BP: 175/98   Pulse: 96   Temp: 98.1 F (36.7 C)   Resp: 17   SpO2: 96%       General appearance: +ill appearing  Eyes: Sclera anicteric, pink conjunctivae, no ptosis  ENMT: mucous membranes moist, nose and ears appear normal.  Oropharynx clear.  Chest: Non labored respirations, no audible wheezing, no clubbing or cyanosis  CV:  Regular rate and rhythm, no JVD, no LE edema  Abdomen: obese, soft, +diffuse tenderness  Skin: Normal color and turgor, no rashes, no suspicious skin lesions noted  Neuro: CN II-XII grossly intact.  No gross movement disorders noted.  Mental status: +depressed, alert and oriented x 3      Labs:  Recent Labs   [facility] 10/08/12 0339 10/07/12 0326    WBC 8.15 7.25    HGB 12.6* 12.5*    HCT 38.4* 38.0*    PLT 230 180    MCV 80.5 81.7       Recent Labs   Basename 10/08/12 0339 10/07/12 0326 10/07/12 0311    NA 139 -- 137    K 4.0 -- 4.5    CL 106 -- 110*    CO2 26 -- 22    BUN 11.0 -- 13.0    CREAT 1.1 -- 1.1    GLU 184* -- 198*    CA 8.5 -- 8.1*    MG 1.8 1.8 --    PHOS 4.0 3.2 --       No results found for this basename: AST:2,ALT:2,ALKPHOS:2,BILITOTAL:2,BILIDIRECT:2,PROT:2,ALB:2 in the last 72 hours    No results found for this basename: PTT:2,PT:2,INR:2 in the last 72 hours      Rads:   Radiological Procedure reviewed.  AXR/CXR 10/05/12:   1. No acute cardiopulmonary disease.   2. No ileus or obstruction.  3. Constipation.    CT A/P 10/08/12: 1. No localizing inflammatory changes to correlate with the patient's  pain. Normal appendix.  2. Other findings as above      Endoscopy:       Pathology:

## 2012-10-10 LAB — BASIC METABOLIC PANEL
BUN: 17 mg/dL (ref 9.0–21.0)
BUN: 17 mg/dL (ref 9.0–21.0)
CO2: 24 (ref 22–29)
CO2: 24 (ref 22–29)
Calcium: 8.7 mg/dL (ref 8.5–10.5)
Calcium: 8.8 mg/dL (ref 8.5–10.5)
Chloride: 104 (ref 98–107)
Chloride: 103 (ref 98–107)
Creatinine: 1.4 mg/dL — ABNORMAL HIGH (ref 0.7–1.3)
Creatinine: 1.3 mg/dL (ref 0.7–1.3)
Glucose: 227 mg/dL — ABNORMAL HIGH (ref 70–100)
Glucose: 218 mg/dL — ABNORMAL HIGH (ref 70–100)
Potassium: 3.8 (ref 3.5–5.1)
Potassium: 3.7 (ref 3.5–5.1)
Sodium: 139 (ref 136–145)
Sodium: 138 (ref 136–145)

## 2012-10-10 LAB — POCT GLUCOSE
Whole Blood Glucose POCT: 189 mg/dL — AB (ref 70–100)
Whole Blood Glucose POCT: 173 mg/dL — AB (ref 70–100)
Whole Blood Glucose POCT: 217 mg/dL — AB (ref 70–100)

## 2012-10-10 LAB — ECG 12-LEAD
Atrial Rate: 72 {beats}/min
P Axis: 50 degrees
P-R Interval: 170 ms
Q-T Interval: 408 ms
QRS Duration: 94 ms
QTC Calculation (Bezet): 446 ms
R Axis: 33 degrees
T Axis: 43 degrees
Ventricular Rate: 72 {beats}/min

## 2012-10-10 LAB — CBC AND DIFFERENTIAL
Basophils Absolute Automated: 0.01 (ref 0.00–0.20)
Basophils Automated: 0 %
Eosinophils Absolute Automated: 0 (ref 0.00–0.70)
Eosinophils Automated: 0 %
Hematocrit: 39.2 % — ABNORMAL LOW (ref 42.0–52.0)
Hgb: 13.2 g/dL (ref 13.0–17.0)
Immature Granulocytes Absolute: 0.03
Immature Granulocytes: 0 %
Lymphocytes Absolute Automated: 1.53 (ref 0.50–4.40)
Lymphocytes Automated: 16 %
MCH: 26.8 pg — ABNORMAL LOW (ref 28.0–32.0)
MCHC: 33.7 g/dL (ref 32.0–36.0)
MCV: 79.7 fL — ABNORMAL LOW (ref 80.0–100.0)
MPV: 9.9 fL (ref 9.4–12.3)
Monocytes Absolute Automated: 0.7 (ref 0.00–1.20)
Monocytes: 7 %
Neutrophils Absolute: 7.47 (ref 1.80–8.10)
Neutrophils: 77 %
Nucleated RBC: 0 (ref 0–1)
Platelets: 235 (ref 140–400)
RBC: 4.92 (ref 4.70–6.00)
RDW: 12 % (ref 12–15)
WBC: 9.74 (ref 3.50–10.80)

## 2012-10-10 LAB — MAGNESIUM: Magnesium: 1.8 mg/dL (ref 1.6–2.6)

## 2012-10-10 LAB — PHOSPHORUS: Phosphorus: 4.6 mg/dL (ref 2.3–4.7)

## 2012-10-10 LAB — GFR
EGFR: >60
EGFR: >60

## 2012-10-10 MED ORDER — AMLODIPINE BESYLATE 5 MG PO TABS
10.0000 mg | ORAL_TABLET | Freq: Every day | ORAL | Status: AC
Start: 2012-10-10 — End: ?

## 2012-10-10 MED ORDER — MAGNESIUM SULFATE IN D5W 10-5 MG/ML-% IV SOLN
1.0000 g | Freq: Once | INTRAVENOUS | Status: AC
Start: 2012-10-10 — End: 2012-10-10
  Administered 2012-10-10: 1 g via INTRAVENOUS
  Filled 2012-10-10: qty 100

## 2012-10-10 MED ORDER — INSULIN GLARGINE 100 UNIT/ML SC SOLN
20.0000 [IU] | Freq: Every evening | SUBCUTANEOUS | Status: DC
Start: 2012-10-10 — End: 2013-01-12

## 2012-10-10 MED ORDER — ERYTHROMYCIN BASE 250 MG PO TABS
250.0000 mg | ORAL_TABLET | Freq: Four times a day (QID) | ORAL | Status: DC
Start: 2012-10-10 — End: 2012-10-26

## 2012-10-10 MED ORDER — PROMETHAZINE HCL 12.5 MG PO TABS
12.5000 mg | ORAL_TABLET | Freq: Four times a day (QID) | ORAL | Status: DC | PRN
Start: 2012-10-10 — End: 2012-10-10

## 2012-10-10 MED ORDER — ONDANSETRON 4 MG PO TBDP
4.0000 mg | ORAL_TABLET | Freq: Three times a day (TID) | ORAL | Status: AC | PRN
Start: 2012-10-10 — End: 2012-11-09

## 2012-10-10 MED ORDER — TRAMADOL HCL 50 MG PO TABS
100.0000 mg | ORAL_TABLET | Freq: Four times a day (QID) | ORAL | Status: DC | PRN
Start: 2012-10-10 — End: 2013-02-10

## 2012-10-10 MED ORDER — METOCLOPRAMIDE HCL 5 MG PO TABS
10.0000 mg | ORAL_TABLET | Freq: Three times a day (TID) | ORAL | Status: AC
Start: 2012-10-10 — End: 2012-11-09

## 2012-10-10 MED ORDER — LORAZEPAM 0.5 MG PO TABS
0.5000 mg | ORAL_TABLET | Freq: Four times a day (QID) | ORAL | Status: AC | PRN
Start: 2012-10-10 — End: 2012-10-12

## 2012-10-10 NOTE — Progress Notes (Signed)
FAMILY MEDICINE PROGRESS NOTE    Date Time: 10/10/2012 7:08 AM  Patient Name: [name]  Attending Physician: Darrol Jump, MD  Team Contact Info: [facility] [phone] 8 am -5 pm: After hours [phone]      Assessment:     Active Hospital Problems    Diagnosis   . Intractable nausea and vomiting   . DM (diabetes mellitus), type 1, uncontrolled with complications   . HTN (hypertension)   . Gastroparesis       37 yo with h/o gastroparesis likely 2/2 uncontrolled Diabetes, HTN, presents with intractable vomiting 2/2 gastroparesis no evidence of ileus on Xray with persistent vomiting    Plan:   # Vomiting 2/2 Gastroparesis   - EGD on 08/08/12 no evidence of gastric outlet obstruction, diffuse gastropathy was present    Irregular squamocolumnar junction - repeat EGD in 3 years , H pylori negative   - Appreciate  [facility] ( Dr. Selena Batten) recommendations   - Zofran, phenergan Scheduled  - ATC reglan and erythromycin for pro motility - QTc is 446 today   - Continue PPI, carafate  - Minimize narcotics - use ice packs,  lidoderm patch   - Try to treat for possible cyclic vomiting with Benadryl ativan combination- Patient improved after ATC yesterday, will change to PRN    - S/p Solumedrol for cyclic migraine treatment with improvement   - Clears, increase IVF for AKI     # HTN  - continue home amlodipine- increase dose, clonidine 0.1 mg BID,   - Hold lisinopril 20 mg today     # Uncontrolled DM  - HbA1C 9.3 , likely has gastric neuropathy   - home regimen 36 units lantus and sliding scale   - Accu check with meals   - Lantus 15 units while NPO   - Will likely need  diabetes referral   - Needs low dose statin to reduce macrovascular complications     - PPX: DVT: heparin   - GI: protonix     Disposition:     Anticipated discharge needs:Possible home today if tolerates clears       Subjective     CC: Vomiting    Interval History/24 hour events:    Events noted overnight Patient seen and examined and case  discussed with Dr. Anselm Jungling. Objective data reviewed. Pain improved. Minimal emesis. Last episode yesterday 3 pm. Epigastric pain improved    Review of Systems:   Denies fevers, chills      Physical Exam:       VITAL SIGNS PHYSICAL EXAM   Temp:  [97.4 F (36.3 C)-98.7 F (37.1 C)] 97.4 F (36.3 C)  Heart Rate:  [77-100] 77   Resp Rate:  [17-18] 18   BP: (134-185)/(78-106) 134/78 mmHg       Intake/Output Summary (Last 24 hours) at 10/10/12 0708  Last data filed at 10/09/12 1100   Gross per 24 hour   Intake      0 ml   Output    125 ml   Net   -125 ml    Physical Exam  Gen: AAOx3 NAD  HEENT: MMM  Cardiac:RRR no MRG   Lungs: CTAB  Abdomen: + BS soft NT, ND   Ext: +2 DP, no clubbing cyanosis edema          Meds:     Medications were reviewed.    Labs:       Labs (last 72 hours):  Lab 10/10/12 0403 10/08/12 0339   WBC 9.74 8.15   HGB 13.2 12.6*   HCT 39.2* 38.4*   PLT 235 230       No results found for this basename: PT:2,INR:2,PTT:2 in the last 168 hours   Lab 10/10/12 0403 10/08/12 0339 10/05/12 1927   NA 139 139 --   K 3.8 4.0 --   CL 104 106 --   CO2 24 26 --   BUN 17.0 11.0 --   CREAT 1.4* 1.1 --   CA 8.7 8.5 --   ALB -- -- 4.1   PROT -- -- 7.3   BILITOTAL -- -- 0.7   ALKPHOS -- -- 97   ALT -- -- 15   AST -- -- 13   GLU 227* 184* --     HbA1C: 9.3             Microbiology, reviewed and are significant for:  None      Imaging personally reviewed, including:   AXR: no ileus   CT scan: Small hiatal hernia, no evidence of obstruction    Case discussed with:Dr. Ho    Signed by: Kristine Linea, MD PGY-2  Pager # [phone]  [facility]   [address]   [place], [place] [address]

## 2012-10-10 NOTE — Plan of Care (Signed)
Pt lethargic this am, held scheduled ativan and phenergan due to respiratory rate of 10-12. Neuro status improved throughout shift, pt A&O x4 by lunch time. No c/o nausea or vomiting throughout the morning. BS covered per SSI. Discussed with pt that we will assess his GI status after lunch to determine if he can tolerate consistent carbohydrate tray. Pt currently eating lunch. Will continue to monitor.

## 2012-10-10 NOTE — Progress Notes (Signed)
------------------------------------------------------------------------------------------------------------------    [facility] Attending Physician Addendum / Note    This patient was seen / examined / discussed with Dr. Army Melia.  Physical exam duplicated.  I agree with assessment and plan as outlined, with any additions or revisions below.     - IVF / antiemetics  - Ativan / Benadryl / steroid taper  - pn control: Tramadol ; avoid opiates  - consider low-dose naloxone if cannot tolerate being off opiates  - E-mycin / Reglan  - standing zofran / phenergan (in addition to PRN)  - await further per GI consult  - if can tolerate diet, consider d/c planning on above PO as well as Relistor

## 2012-10-10 NOTE — Student Progress (Signed)
FAMILY MEDICINE PROGRESS NOTE    Date Time: 10/10/2012 7:55 AM  Patient Name: [name]  Attending Physician: Darrol Jump, MD  Team Contact Info: [employer] [phone], after 5pm [phone]     Assessment: [name] is a 37 y.o. male with history of DM2, gastroparesis, and HTN who presented with intractable vomiting 2/2 to gastroparesis. Improving but continues to have poor po intake.    Plan:  #Nausea and vomiting likely 2/2 gastroparesis, but possible component of cyclical vomiting    - Improving but has not had much po intake  - AXR on at time of admission and CT excluded ileus. Also, LFTs and lipase were normal making pancreatitis or hepatobiliary process less likely.   - Appreciate GI recommendations  - Continue Reglan 10 mg IV TID, Erythromycin 250mg  IV q8h, and scheduled antiemetics   - Transition medications from IV to po as patient symptoms improve  - Recheck EKG to evaluate QTc. QTc on 8/27 was 457.  - Treated patient for cyclical vomiting with Ativan and Benadryl. Transition from scheduled to prn.    #Abdominal Pain   - Likely secondary to intractable n/v 2/2 gastroparesis or cyclical vomiting  - Continue Ultram to 100 mg po q6h prn for pain  - Continue Lidoderm patch   - Continue simethicone for gas relief  - Consider Relistor, which selectively antagonizes peripheral mu-opioid receptors, as an out-patient  - Avoid opioids given gastroparesis and constipation     #DM2, insulin dependent   - Claims to be compliant with insulin regimen. A1c = 9.3. Microalbinuria low.   - Currently on Lantus 15 U qHS (per records takes 36 U at home, which is consistent with 0.3 units/kg) and required only 1 U SSI in past 24 hours.  - Continue prn SSI with POCT glucose with meals    #HTN   - Pressures elevated upon presenting to ED and pressures continue to be elevated. Pain likely causing increase in BP.   - Continue clonidine 0.1 mg q 12h andNorvasc 10 mg po qd  - Hold lisinopril 20 mg po qd given Cr increased to  1.4 from 1.1  - Continue to monitor     #AKI   - Likely 2/2 dehydration/vomiting   - Improved. Baseline Cr =1, currently at 1.4  - On IVF - NS @ 100 cc/hr.     #GERD   - Chronic with history of gastritis and EGD on 08/08/12 negative for H. pylori and intestinal metaplasia  - Appreciate GI consult   - Continue Protonix and Carafate although both can interact if taken at the same time, so PPI should be taken 30 minutes prior to taking Carafate.     #GI/FEN - Advance diet slowly, clears for now and maybe small low-fat/low-fiber diabetic meals later this evening  #PPx - Heparin 5000 SC q8h   #Dispo - d/c home once n/v improves and tolerating po well    Subjective:   [name] reports that he is doing better, wanting to go home. Has had no opioids in past 24 hours. Denies any vomiting since yesterday morning. Denies nausea, abdominal pain, f/c, SOB. Had loose BM x1 yesterday. Has not eaten much. Claims to be taking sips of fluid without difficulty.      Meds:  Scheduled Meds:  Current Facility-Administered Medications   Medication Dose Route Frequency   . amLODIPine  10 mg Oral Daily   . cloNIDine  0.1 mg Oral Q12H SCH   . [COMPLETED] diphenhydrAMINE  25 mg Intravenous Q6H SCH   . erythromycin  250 mg Intravenous Q8H   . heparin (porcine)  5,000 Units Subcutaneous Q8H SCH   . insulin glargine  15 Units Subcutaneous QHS   . lidocaine  1 patch Transdermal Q24H   . LORazepam  0.5 mg Intravenous Q6H SCH   . magnesium sulfate  1 g Intravenous Once   . [COMPLETED] methylprednisolone  60 mg Intravenous Once   . metoclopramide  10 mg Intravenous TID   . ondansetron  4 mg Intravenous Q8H   . pantoprazole  40 mg Oral Q12H SCH   . promethazine  12.5 mg Intravenous Q6H   . [COMPLETED] sodium phosphate  1 enema Rectal Once   . sucralfate  1 g Oral QID   . [DISCONTINUED] insulin glargine  22 Units Subcutaneous QHS   . [DISCONTINUED] lisinopril  20 mg Oral Daily   Continuous Infusions:       . sodium chloride 100 mL/hr at 10/09/12  2241   PRN Meds:.acetaminophen, dextrose, dextrose, diphenhydrAMINE, hydrALAZINE, HYDROmorphone, insulin aspart, LORazepam, simethicone, traMADol    Objective:  Temp:  [97.4 F (36.3 C)-98.7 F (37.1 C)] 97.4 F (36.3 C)  Heart Rate:  [76-96] 76   Resp Rate:  [10-18] 10   BP: (134-175)/(78-98) 141/82 mmHg    Intake/Output Summary (Last 24 hours) at 10/10/12 0755  Last data filed at 10/09/12 1100   Gross per 24 hour   Intake      0 ml   Output    125 ml   Net   -125 ml       General: Obese Black male in NAD, speaking more than yesterday.   HEENT: Mucus membranes moist, would open eyes when speaking today unlike yesterday  CV: 2+ pulses, S1S2 RRR with no m/r/g noted   Lungs: CTAB no wheezes, rhonchi, rales   Abdomen: Obese, normoactive bowel sounds, soft, not distended, non-tender to palpation today  Extremities: No edema, clubbing or cyanosis     Labs:  Results     Procedure Component Value Units Date/Time    POCT glucose (Q4H) [[id number]]  (Abnormal) Collected:10/10/12 0747     POCT Glucose WB 189 (A) mg/dL [id number]:95/62/13 0865    Magnesium [[id number]] Collected:10/10/12 0403    Specimen Information:Blood Updated:10/10/12 0530     Magnesium 1.8 mg/dL     Phosphorus [[id number]] Collected:10/10/12 0403    Specimen Information:Blood Updated:10/10/12 0530     Phosphorus 4.6 mg/dL     Basic Metabolic Panel [[id number]]  (Abnormal) Collected:10/10/12 0403    Specimen Information:Blood Updated:10/10/12 0530     Glucose 227 (H) mg/dL      BUN 44.0 mg/dL      Creatinine 1.4 (H) mg/dL      CALCIUM 8.7 mg/dL      Sodium 347      Potassium 3.8      Chloride 104      CO2 24     GFR [[id number]] Collected:10/10/12 0403     EGFR >60.0 Updated:10/10/12 0530    CBC and differential [[id number]]  (Abnormal) Collected:10/10/12 0403    Specimen Information:Blood / Blood Updated:10/10/12 0507     WBC 9.74      RBC 4.92      Hgb 13.2 g/dL      Hematocrit 88.4 (L) %      MCV 79.7 (L) fL      MCH 26.8 (L) pg      MCHC 33.7 g/dL      RDW  12 %      Platelets 235      MPV 9.9 fL      Neutrophils 77 %      Lymphocytes Automated 16 %      Monocytes 7 %      Eosinophils Automated 0 %      Basophils Automated 0 %      Immature Granulocyte 0 %      Nucleated RBC 0      Neutrophils Absolute 7.47      Abs Lymph Automated 1.53      Abs Mono Automated 0.70      Abs Eos Automated 0.00      Absolute Baso Automated 0.01      Absolute Immature Granulocyte 0.03     POCT glucose (Q4H) [[id number]]  (Abnormal) Collected:10/09/12 2213     POCT Glucose WB 214 (A) mg/dL [id number]:61/60/73 7106    POCT glucose (Q4H) [[id number]]  (Abnormal) Collected:10/09/12 1528     POCT Glucose WB 160 (A) mg/dL [id number]:38/18/29 9371    POCT glucose (Q4H) [[id number]]  (Abnormal) Collected:10/09/12 1140     POCT Glucose WB 183 (A) mg/dL OFBPZWC:58/52/77 8242          Imaging:  Ct Abdomen Pelvis W Iv And Po Contrast    10/08/2012   1. No localizing inflammatory changes to correlate with the patient's pain. Normal appendix. 2. Other findings as above.  Jasmine December  D'Heureux, MD  10/08/2012 10:28 PM     Abdomen 2 View With Chest 1 View    10/05/2012    1. No acute cardiopulmonary disease.  2. No ileus or obstruction. 3. Constipation.  Collene Schlichter, MD  10/05/2012 7:03 PM       Case discussed with: Dr. Army Melia and Dr. Anselm Jungling    Signed by: Terrance Mass, MS4

## 2012-10-10 NOTE — Discharge Instructions (Signed)
Your vomiting is likely from your gastroparesis  - Continue reglan and erythromycin to help with motility   - continue Zofran as needed for nausea   -  Continue carafate and protonix to help with gastric irritation   - Try to avoid narcotics as much as possible   - You can take benadryl and ativan in the next 3 days if your symptoms continue     You should take only 20 units of lantus until your appetite improves. Follow up with Dr. Alben Spittle and he can recommend when to resume the 36 units     Use lidoderm patch for pain for the next 3 days     For your high blood pressure we have increased amlodipine to help  Gastroparesis  Gastroparesis (also called delayed gastric emptying) occurs when the stomach takes longer than normal to empty of food. This is due to a problem with motility (the movement of the muscles in the digestive tract). For many people, gastroparesis is a lifelong condition. But treatment can help relieve symptoms and prevent complications. Read on to learn more about gastroparesis and how it can be managed.  How Gastroparesis Develops     Gastroparesis means that food and fluids move too slowly out of the stomach into the duodenum.   With normal motility, signals from nerves tell the stomach muscles when to contract. These muscles move food from the stomach into the duodenum (the first part of the small bowel). With gastroparesis, the nerves or muscles are damaged. This causes motility to slow down or stop completely. As a result, food cannot move from the stomach properly. This delayed emptying can cause nausea, vomiting, and other symptoms. Malnutrition can result. Bezoars (hardened lumps of food) can form in the stomach and cause other complications as well.  Causes of Gastroparesis  Gastroparesis can be caused by any of the following:   Diabetes   Surgery involving any of the digestive organs, such as the stomach and bowels   Certain medications, such as strong pain medications  (narcotics)   Certain conditions, such as systemic scleroderma, Parkinson's disease, and thyroid disease  In many cases, the cause of gastroparesis cannot be found.  Signs and Symptoms of Gastroparesis  These can include:   Nausea and vomiting   Feeling full quickly when eating   Abdominal pain   Heartburn   Abdominal bloating   Weight loss   Loss of appetite   High and low blood sugar levels (in people withdiabetes)  Diagnosing Gastroparesis  Your doctor will ask about your symptoms and health history. You'll also be examined. In addition, blood tests and X-rays are often done to check your health and rule out other problems. To confirm the problem, you may need other tests as well. These can include:   Upper endoscopy.This is doneto see inside the stomach and duodenum. For the test, an endoscope is used. This is a thin, flexible tube with a tiny camera on the end. It's inserted through the mouth and down into the stomach and duodenum.   Upper gastrointestinal (GI) series.This is doneto take X-rays of the upper GI tract from the mouth to the small bowel. For the test, a substance called barium is used. The barium coats the upper GI tract so that it will show up clearly on X-rays.   Gastric emptying scan.This is done to measure how quickly food leaves the stomach. For the test, a meal containing a harmless radioactive substance (tracer) is eaten. Then scans of  the stomach are done. The tracer shows up clearly on the scans and shows the movement of the food through the stomach.  Treating Gastroparesis  The goal of treatment is to help you manage your condition. Treatment may include one or more of the following:   Dietary changes.You may need to make changes to your eating habits and daily diet. For instance, your doctor may instruct you to eat small meals throughout the day. Doing this can keep you from feeling full too quickly. You may be placed on a liquid or "soft" diet. This means you'll eat  liquid foods or foods that are mashed or put through a blender. In addition, you may need to avoid foods high in fats and fiber. These can slow digestion. For more help with your diet, your doctor can refer you to a dietitian. In severe cases, you may need a feeding tube. This sends liquid food or medication directly to your small bowel, bypassing the stomach.   Medications.These can help manage symptoms, such as nausea and vomiting. They can also improve motility. Each medication has specific risks and side effects. Your doctor can tell you more about any medication that is prescribed for you.   Surgery.You may need to have a tube surgically inserted into the stomach. The tube removes excess air and fluid. This can relieve severe symptoms of nausea and vomiting. In rare cases, other surgery may be needed on the stomach or small bowel. This is to create a new passageway for food to be emptied from the stomach.   Other treatments.These include botulin toxin injection and gastric electrical stimulation. They are done less often and may not be available. Your doctor can tell you more about these treatments if they are options for you.  Diabetes and Gastroparesis  If you have diabetes, gastroparesis can make it harder to manage your blood sugar level. You'll need to take extra steps in your treatment to prevent complications. Work with your doctor to learn what you can do to protect your health. For more information, contact the American Diabetes Association,www.diabetes.org.   Long-term Concerns  With treatment, most people can manage their symptoms and maintain their usual routines. If your symptoms are moderate to severe, you may need to see your doctor more frequently for checkups. Also, other treatments will likely be needed.   [address], [address] [place], [place] [address]. All rights reserved. This information is not intended as a substitute for professional medical care. Always  follow your healthcare professional's instructions.

## 2012-10-10 NOTE — Progress Notes (Signed)
Remains alert and stable. Pain well controlled. No episode of vomiting. Pt had a quiet night. N/O in place for a 12 lead ECG and clear liquid diet. No acute distress noted. Will be free of injury during hospitalization.

## 2012-10-10 NOTE — Progress Notes (Signed)
[facility] [place]  PROGRESS NOTE    Date Time: 10/10/2012 3:11 PM  Patient Name: [name], [name]      Chief Complaint:   NV 2/2 diabetic gastroparesis     Assessment and Plan:    Assessment:  1. Gastroparesis. He could have component of cyclic vomiting syndrome   2. Constipation with narcotic gut   3. GERD   4. DM type 2, insulin dependent, poorly controlled.  5. Abdominal Pain with NV, secondary to #1.    Plan:  1. Ativan 1mg  with benadryl together if continued vomiting. Phenergan 12.5mg  every 6 hours scheduled.  2. Reglan 10mg  IV tid for symptomatic mgmt   3. Continue Erythromycin. Consider evaluation at [facility] for Domperidone.  4. Frequent small low fat meals within diabetic diet. Tight glucose control should help as well   5. Continue PPI bid and Carafate   6. Prn Miralax. Consider Relistor as an outpatient.  7. Avoid narcotics which will slow GI motility and worsen constipation.  8. The patient is stable for discharge from the GI perspective. Will sign off. Follow-up as outpatient.      Subjective:   Patient without vomiting overnight. Abdominal pain improved. Tolerated clear liquids this morning and regular diet for lunch. Wants to go home.    Medications:     Current Facility-Administered Medications   Medication Dose Route Frequency   . amLODIPine  10 mg Oral Daily   . cloNIDine  0.1 mg Oral Q12H SCH   . [COMPLETED] diphenhydrAMINE  25 mg Intravenous Q6H SCH   . erythromycin  250 mg Intravenous Q8H   . heparin (porcine)  5,000 Units Subcutaneous Q8H SCH   . insulin glargine  15 Units Subcutaneous QHS   . lidocaine  1 patch Transdermal Q24H   . LORazepam  0.5 mg Intravenous Q6H SCH   . [COMPLETED] magnesium sulfate  1 g Intravenous Once   . metoclopramide  10 mg Intravenous TID   . ondansetron  4 mg Intravenous Q8H   . pantoprazole  40 mg Oral Q12H SCH   . promethazine  12.5 mg Intravenous Q6H   . [COMPLETED] sodium phosphate  1 enema Rectal Once   . sucralfate  1 g Oral QID   .  [DISCONTINUED] lisinopril  20 mg Oral Daily       Review of Systems:   Complete 12 Point ROS negative or normal except those mentioned in HPI above.    Physical Exam:     Filed Vitals:    10/10/12 1147   BP: 140/86   Pulse: 78   Temp: 98.1 F (36.7 C)   Resp: 18   SpO2: 97%     General appearance: +ill appearing   Eyes: Sclera anicteric, pink conjunctivae, no ptosis   ENMT: mucous membranes moist, nose and ears appear normal. Oropharynx clear.   Chest: Non labored respirations, no audible wheezing, no clubbing or cyanosis   CV: Regular rate and rhythm, no JVD, no LE edema   Abdomen: obese, soft, +diffuse tenderness   Skin: Normal color and turgor, no rashes, no suspicious skin lesions noted   Neuro: CN II-XII grossly intact. No gross movement disorders noted.   Mental status: +depressed, alert and oriented x 3        Labs:     Recent Labs   [facility] 10/10/12 0403 10/08/12 0339    WBC 9.74 8.15    HGB 13.2 12.6*    HCT 39.2* 38.4*    PLT 235  230    MCV 79.7* 80.5       Recent Labs   Basename 10/10/12 0403 10/08/12 0339    NA 139 139    K 3.8 4.0    CL 104 106    CO2 24 26    BUN 17.0 11.0    CREAT 1.4* 1.1    GLU 227* 184*    CA 8.7 8.5    MG 1.8 1.8    PHOS 4.6 4.0       No results found for this basename: AST:2,ALT:2,ALKPHOS:2,BILITOTAL:2,BILIDIRECT:2,PROT:2,ALB:2 in the last 72 hours    No results found for this basename: PTT:2,PT:2,INR:2 in the last 72 hours     Radiology:   Radiological Procedure reviewed:  AXR/CXR 10/05/12: No acute cardiopulmonary disease. No ileus or obstruction. Constipation.  CT 10/08/12: No localizing inflammatory changes to correlate with the patient's pain. Normal appendix. Other findings as above.     Endoscopy:        Pathology:

## 2012-10-11 NOTE — Discharge Summary (Signed)
DISCHARGE SUMMARY    Date Time: 10/11/2012 6:54 PM  Patient Name: [name]  DOB: October 30, 1975  MRN: [id number]  Attending Physician: [name]  Primary Care Physician: [name] Devoid, MD     Date of Admission:   10/05/2012    Date of Discharge:   10/10/2012     Reason for Admission:   Intractable nausea and vomiting    Diagnosis:   DISCHARGE DIAGNOSIS    Active Problem List:  Active Hospital Problems    Diagnosis   . Intractable nausea and vomiting   . DM (diabetes mellitus), type 1, uncontrolled with complications   . HTN (hypertension)   . Gastroparesis       Consultations:   1. [facility] [name]     Procedures performed:   None     History of Present Illness:   History provided by [name]    [name] is a 37 y.o. male PMHx significant for HTN, DM2, neuropathy and gastroparesis admitted for intractable n/v, dehydration x 1 day. Reports n/v began this am. Felt as though he "was not digesting." Has had 15-20 episodes non-bloody non-bilious emesis. Has not been able to eat anything since yesterday evening. Associated symptoms include intermittent sweats and fatigue. Has hx of gastroparesis however has not taken any of his PRN meds recently until today. Did not offer any relief. Denies any recent fevers, cough, cold, congestion, cp, diarrhea, dysuria. No sick contacts. Does not have endocrinologist or GI doctor.    Reports he has been taking his meds regularly (including insulin) up until today. Has also been checking BG levels, and has been ranging low 100s fasting and 200s after meals. Denies any inc thirst, although does report inc freq of urination recently. Has had DKA in the past, denies prior association with similar symptoms. HgbA1C 10.3 on 08/11/2012.    In ED pt noted to have AKI (Cr 1.4, baseline 1.1) and hyperglycemia (300's). LFTs, lipase wnl. UA w/trace proteins, >1000 glucose, trace ketones. HCO3 26. AG 12. Blood acetone was negative. Abd Xray without  evidence of ileus or obstruction. No evidence of infection on exam or lab findings. While in ED was given 2 x 1L bolus NS for hydration, dilaudid x 2 for pain, reglan x1, zofran x 2, phenergan x 2 for nausea and vomiting with little relief.       Hospital Course:    He was admitted for intractable nausea and vomiting which was thought to be due to gastroparesis from uncontrolled diabetes mellitus. He failed to improve on erythromycin, reglan and scheduled phenergan. He was given IVF and zofran for nausea. Given that he failed to improve after 3 days he underwent CT scan which demonstrated no obvious obstruction or ileus just a small hiatal hernia. After discussion with GI and continued episodes of emesis other etiologies were consider and a trial with steroids, benadryl and ativan was tried for cyclic vomiting. He improved and had no further episodes of vomiting and was able to tolerate clears. His improvement may be secondary to time course for gastroparesis flare or cyclic vomiting treatment.  He may be a candidate for domperidone as an outpatient. His QT interval was monitored closely while on several QT prolonging medications and it was 446 on discharge     # Diabetes mellitus    Uncontrolled with complications including gastric neuropathy as evidenced by gastroparesis. HbA1c was 9.3 demonstrating uncontrolled regimen despite lantus. He was on half his lantus  as an inpatient given poor oral intake. He was recommended to take 20 units at night until follow up with Dr. Alben Spittle and oral intake improved. He should consider initiating aspirin and statin for prevention of macrovascular complications     # Hypertension - uncontrolled    He seemed to have significantly elevated blood pressures associated with pain and vomiting however they did remain elevated so his norvasc was increased to 10 mg with improved control.     Discharge Exam:     Filed Vitals:    10/10/12 1701   BP: 127/76   Pulse: 107   Temp: 97.1 F (36.2  C)   Resp: 18   SpO2: 97%       No intake or output data in the 24 hours ending 10/11/12 1854    General: Obese Black male in NAD laying comfortably in bed   HEENT: Mucus membranes moist   CV: 2+ pulses, S1S2 RRR with no m/r/g noted   Lungs: CTAB no wheezes, rhonchi, rales   Abdomen: Obese, normoactive bowel sounds, soft, not distended, nontender this morning but expresses that he is sore when palpating epigastric area, no hepatosplenomegaly noted   Extremities: Trace bilateral pedal edema, no clubbing or cyanosis   extremities    Discharge Medications:      [name]   Home Medication Instructions QMV:[id number]    Printed on:10/11/12 1852   Medication Information                      cloNIDine (CATAPRES) 0.1 MG tablet  Take 1 tablet (0.1 mg total) by mouth every 12 (twelve) hours.             pantoprazole (PROTONIX) 40 MG tablet  Take 1 tablet (40 mg total) by mouth 2 (two) times daily.             sucralfate (CARAFATE) 1 G tablet  Take 1 tablet (1 g total) by mouth 4 (four) times daily.             amLODIPine (NORVASC) 5 MG tablet  Take 2 tablets (10 mg total) by mouth daily.             insulin glargine (LANTUS) 100 UNIT/ML injection  Inject 20 Units into the skin nightly.             traMADol (ULTRAM) 50 MG tablet  Take 2 tablets (100 mg total) by mouth every 6 (six) hours as needed.             erythromycin base (E-MYCIN) 250 MG tablet  Take 1 tablet (250 mg total) by mouth 4 (four) times daily.             ondansetron (ZOFRAN ODT) 4 MG disintegrating tablet  Take 1 tablet (4 mg total) by mouth every 8 (eight) hours as needed for Nausea.             metoclopramide (REGLAN) 5 MG tablet  Take 2 tablets (10 mg total) by mouth 3 (three) times daily.             LORazepam (ATIVAN) 0.5 MG tablet  Take 1 tablet (0.5 mg total) by mouth every 6 (six) hours as needed (vomiting ).                 Recent Labs:     Results     Procedure Component Value Units Date/Time    Basic Metabolic Panel [[id number]]   (  Abnormal) Collected:10/10/12 1711    Specimen Information:Blood Updated:10/10/12 1755     Glucose 218 (H) mg/dL      BUN 60.4 mg/dL      Creatinine 1.3 mg/dL      CALCIUM 8.8 mg/dL      Sodium 540      Potassium 3.7      Chloride 103      CO2 24     GFR [[id number]] Collected:10/10/12 1711     EGFR >60.0 Updated:10/10/12 1755    POCT glucose (Q4H) [[id number]]  (Abnormal) Collected:10/10/12 1713     POCT Glucose WB 217 (A) mg/dL GNFAOZH:08/65/78 4696    POCT glucose (Q4H) [[id number]]  (Abnormal) Collected:10/10/12 1146     POCT Glucose WB 173 (A) mg/dL GMWNUUV:25/36/64 4034    POCT glucose (Q4H) [[id number]]  (Abnormal) Collected:10/10/12 0747     POCT Glucose WB 189 (A) mg/dL VFIEPPI:95/18/84 1660    Magnesium [[id number]] Collected:10/10/12 0403    Specimen Information:Blood Updated:10/10/12 0530     Magnesium 1.8 mg/dL     Phosphorus [[id number]] Collected:10/10/12 0403    Specimen Information:Blood Updated:10/10/12 0530     Phosphorus 4.6 mg/dL     Basic Metabolic Panel [[id number]]  (Abnormal) Collected:10/10/12 0403    Specimen Information:Blood Updated:10/10/12 0530     Glucose 227 (H) mg/dL      BUN 37.6 mg/dL      Creatinine 1.4 (H) mg/dL      CALCIUM 8.7 mg/dL      Sodium 283      Potassium 3.8      Chloride 104      CO2 24     GFR [[id number]] Collected:10/10/12 0403     EGFR >60.0 Updated:10/10/12 0530    CBC and differential [[id number]]  (Abnormal) Collected:10/10/12 0403    Specimen Information:Blood / Blood Updated:10/10/12 0507     WBC 9.74      RBC 4.92      Hgb 13.2 g/dL      Hematocrit 85.4 (L) %      MCV 79.7 (L) fL      MCH 26.8 (L) pg      MCHC 33.7 g/dL      RDW 12 %      Platelets 235      MPV 9.9 fL      Neutrophils 77 %      Lymphocytes Automated 16 %      Monocytes 7 %      Eosinophils Automated 0 %      Basophils Automated 0 %      Immature Granulocyte 0 %      Nucleated RBC 0      Neutrophils Absolute 7.47      Abs Lymph Automated 1.53      Abs Mono Automated 0.70      Abs Eos Automated 0.00       Absolute Baso Automated 0.01      Absolute Immature Granulocyte 0.03     POCT glucose (Q4H) [[id number]]  (Abnormal) Collected:10/09/12 2213     POCT Glucose WB 214 (A) mg/dL FGHWEXH:37/16/96 7893          Discharge Instructions:   The patient was discharged in stable condition with the diagnosis of Vomiting from gastroparesis or cyclic vomiting syndrome .  Patient was discharged in stable condition home. The patient was instructed to follow up with their primary care physician Dr. Alben Spittle at [facility] in 1 week or sooner if needed.  They were also  informed to see the following specialists: Dr. Jens Som (gastroenterology) in  1 week. Patient was instructed to continue a diabetic, 6 small frequent meals  until otherwise specified.  Activity recommendations are as follows: activity as tolerated.  Wound care and/or line cares recommendations are as follows: none needed.  Please see medication reconciliation for discharge medications.  Patient informed to return to the ER if they note recurrence or worsening of symptoms, intractable pain, persistent fevers, chest pain, chest pressure, persistent/not improving shortness of breath.  Patient informed to contact their primary care physician if they have any questions.    Your vomiting is likely from your gastroparesis  - Continue reglan and erythromycin to help with motility   - continue Zofran as needed for nausea   -  Continue carafate and protonix to help with gastric irritation   - Try to avoid narcotics as much as possible   - You can take benadryl and ativan in the next 3 days if your symptoms continue     You should take only 20 units of lantus until your appetite improves. Follow up with Dr. Alben Spittle and he can recommend when to resume the 36 units     Use lidoderm patch for pain for the next 3 days     For your high blood pressure we have increased amlodipine to help      Signed by: Kristine Linea

## 2012-10-25 ENCOUNTER — Inpatient Hospital Stay: Payer: BC Managed Care – PPO | Admitting: Family Medicine

## 2012-10-25 ENCOUNTER — Inpatient Hospital Stay
Admission: EM | Admit: 2012-10-25 | Discharge: 2012-10-26 | DRG: 074 | Disposition: A | Discharge status: Home / Self Care | Payer: BC Managed Care – PPO | Attending: Family Medicine | Admitting: Family Medicine

## 2012-10-25 DIAGNOSIS — K3184 Gastroparesis: Secondary | ICD-10-CM | POA: Diagnosis present

## 2012-10-25 DIAGNOSIS — R739 Hyperglycemia, unspecified: Secondary | ICD-10-CM

## 2012-10-25 DIAGNOSIS — I10 Essential (primary) hypertension: Secondary | ICD-10-CM | POA: Diagnosis present

## 2012-10-25 DIAGNOSIS — E860 Dehydration: Secondary | ICD-10-CM | POA: Diagnosis present

## 2012-10-25 DIAGNOSIS — T380X5A Adverse effect of glucocorticoids and synthetic analogues, initial encounter: Secondary | ICD-10-CM | POA: Diagnosis present

## 2012-10-25 DIAGNOSIS — Z794 Long term (current) use of insulin: Secondary | ICD-10-CM

## 2012-10-25 DIAGNOSIS — K5900 Constipation, unspecified: Secondary | ICD-10-CM | POA: Diagnosis present

## 2012-10-25 DIAGNOSIS — N289 Disorder of kidney and ureter, unspecified: Secondary | ICD-10-CM

## 2012-10-25 DIAGNOSIS — D72829 Elevated white blood cell count, unspecified: Secondary | ICD-10-CM | POA: Diagnosis present

## 2012-10-25 DIAGNOSIS — R112 Nausea with vomiting, unspecified: Secondary | ICD-10-CM

## 2012-10-25 DIAGNOSIS — K219 Gastro-esophageal reflux disease without esophagitis: Secondary | ICD-10-CM | POA: Diagnosis present

## 2012-10-25 DIAGNOSIS — N179 Acute kidney failure, unspecified: Secondary | ICD-10-CM | POA: Diagnosis present

## 2012-10-25 DIAGNOSIS — E1149 Type 2 diabetes mellitus with other diabetic neurological complication: Principal | ICD-10-CM | POA: Diagnosis present

## 2012-10-25 DIAGNOSIS — R109 Unspecified abdominal pain: Secondary | ICD-10-CM

## 2012-10-25 LAB — URINALYSIS WITH MICROSCOPIC
Bilirubin, UA: NEGATIVE
Blood, UA: NEGATIVE
Glucose, UA: 1000 — AB
Leukocyte Esterase, UA: NEGATIVE
Nitrite, UA: NEGATIVE
Protein, UR: NEGATIVE
Specific Gravity UA: 1.015 (ref 1.001–1.035)
Urine pH: 6 (ref 5.0–8.0)
Urobilinogen, UA: NORMAL mg/dL

## 2012-10-25 LAB — BASIC METABOLIC PANEL
BUN: 13 mg/dL (ref 9.0–21.0)
CO2: 25 (ref 22–29)
Calcium: 10.2 mg/dL (ref 8.5–10.5)
Chloride: 102 (ref 98–107)
Creatinine: 1.5 mg/dL — ABNORMAL HIGH (ref 0.7–1.3)
Glucose: 293 mg/dL — ABNORMAL HIGH (ref 70–100)
Potassium: 4.4 (ref 3.5–5.1)
Sodium: 140 (ref 136–145)

## 2012-10-25 LAB — ECG 12-LEAD
Atrial Rate: 106 {beats}/min
P Axis: 67 degrees
P-R Interval: 168 ms
Q-T Interval: 342 ms
QRS Duration: 80 ms
QTC Calculation (Bezet): 454 ms
R Axis: 60 degrees
T Axis: 23 degrees
Ventricular Rate: 106 {beats}/min

## 2012-10-25 LAB — HEPATIC FUNCTION PANEL
ALT: 19 U/L (ref 0–55)
AST (SGOT): 14 U/L (ref 5–34)
Albumin/Globulin Ratio: 1.3 (ref 0.9–2.2)
Albumin: 4.5 g/dL (ref 3.5–5.0)
Alkaline Phosphatase: 101 U/L (ref 40–150)
Bilirubin Direct: 0.2 mg/dL (ref 0.0–0.5)
Bilirubin Indirect: 0.5 mg/dL (ref 0.0–1.1)
Bilirubin, Total: 0.7 mg/dL (ref 0.2–1.2)
Globulin: 3.5 g/dL (ref 2.0–3.6)
Protein, Total: 8 g/dL (ref 6.0–8.3)

## 2012-10-25 LAB — POCT GLUCOSE
Whole Blood Glucose POCT: 262 mg/dL — AB (ref 70–100)
Whole Blood Glucose POCT: 243 mg/dL — AB (ref 70–100)
Whole Blood Glucose POCT: 198 mg/dL — AB (ref 70–100)
Whole Blood Glucose POCT: 200 mg/dL — AB (ref 70–100)

## 2012-10-25 LAB — CBC AND DIFFERENTIAL
Basophils Absolute Automated: 0.03 (ref 0.00–0.20)
Basophils Automated: 0 %
Eosinophils Absolute Automated: 0.02 (ref 0.00–0.70)
Eosinophils Automated: 0 %
Hematocrit: 44.3 % (ref 42.0–52.0)
Hgb: 14.8 g/dL (ref 13.0–17.0)
Immature Granulocytes Absolute: 0.05
Immature Granulocytes: 0 %
Lymphocytes Absolute Automated: 1.29 (ref 0.50–4.40)
Lymphocytes Automated: 10 %
MCH: 27.3 pg — ABNORMAL LOW (ref 28.0–32.0)
MCHC: 33.4 g/dL (ref 32.0–36.0)
MCV: 81.6 fL (ref 80.0–100.0)
MPV: 9.7 fL (ref 9.4–12.3)
Monocytes Absolute Automated: 0.43 (ref 0.00–1.20)
Monocytes: 3 %
Neutrophils Absolute: 11.09 — ABNORMAL HIGH (ref 1.80–8.10)
Neutrophils: 86 %
Nucleated RBC: 0 (ref 0–1)
Platelets: 389 (ref 140–400)
RBC: 5.43 (ref 4.70–6.00)
RDW: 13 % (ref 12–15)
WBC: 12.91 — ABNORMAL HIGH (ref 3.50–10.80)

## 2012-10-25 LAB — GFR: EGFR: >60

## 2012-10-25 LAB — LIPASE: Lipase: 41 U/L (ref 8–78)

## 2012-10-25 MED ORDER — ONDANSETRON HCL 4 MG/2ML IJ SOLN
4.0000 mg | Freq: Once | INTRAMUSCULAR | Status: AC
Start: 2012-10-25 — End: 2012-10-25
  Administered 2012-10-25: 4 mg via INTRAVENOUS
  Filled 2012-10-25: qty 2

## 2012-10-25 MED ORDER — GLUCOSE 40 % PO GEL
15.0000 g | ORAL | Status: DC | PRN
Start: 2012-10-25 — End: 2012-10-26

## 2012-10-25 MED ORDER — LORAZEPAM 2 MG/ML IJ SOLN
1.0000 mg | Freq: Four times a day (QID) | INTRAMUSCULAR | Status: DC
Start: 2012-10-25 — End: 2012-10-26
  Administered 2012-10-25 – 2012-10-26 (×6): 1 mg via INTRAVENOUS
  Filled 2012-10-25 (×6): qty 1

## 2012-10-25 MED ORDER — INSULIN REGULAR HUMAN 100 UNIT/ML IJ SOLN
1.0000 [IU] | Freq: Every evening | INTRAMUSCULAR | Status: DC | PRN
Start: 2012-10-25 — End: 2012-10-25

## 2012-10-25 MED ORDER — DEXTROSE 50 % IV SOLN
25.0000 mL | INTRAVENOUS | Status: DC | PRN
Start: 2012-10-25 — End: 2012-10-26

## 2012-10-25 MED ORDER — HYDROMORPHONE HCL PF 1 MG/ML IJ SOLN
1.0000 mg | Freq: Once | INTRAMUSCULAR | Status: AC
Start: 2012-10-25 — End: 2012-10-25
  Administered 2012-10-25: 1 mg via INTRAVENOUS
  Filled 2012-10-25: qty 1

## 2012-10-25 MED ORDER — SUCRALFATE 1 G PO TABS
1.0000 g | ORAL_TABLET | Freq: Four times a day (QID) | ORAL | Status: DC
Start: 2012-10-25 — End: 2012-10-26
  Administered 2012-10-25 – 2012-10-26 (×4): 1 g via ORAL
  Filled 2012-10-25 (×7): qty 1

## 2012-10-25 MED ORDER — ENOXAPARIN SODIUM 40 MG/0.4ML SC SOLN
40.0000 mg | Freq: Every day | SUBCUTANEOUS | Status: DC
Start: 2012-10-25 — End: 2012-10-26
  Administered 2012-10-25 – 2012-10-26 (×2): 40 mg via SUBCUTANEOUS
  Filled 2012-10-25 (×2): qty 0.4

## 2012-10-25 MED ORDER — HYDRALAZINE HCL 20 MG/ML IJ SOLN
10.0000 mg | Freq: Four times a day (QID) | INTRAMUSCULAR | Status: DC | PRN
Start: 2012-10-25 — End: 2012-10-26
  Administered 2012-10-25: 10 mg via INTRAVENOUS
  Filled 2012-10-25: qty 1

## 2012-10-25 MED ORDER — AMLODIPINE BESYLATE 5 MG PO TABS
10.0000 mg | ORAL_TABLET | Freq: Every day | ORAL | Status: DC
Start: 2012-10-25 — End: 2012-10-25

## 2012-10-25 MED ORDER — TRAMADOL HCL 50 MG PO TABS
50.0000 mg | ORAL_TABLET | Freq: Four times a day (QID) | ORAL | Status: DC | PRN
Start: 2012-10-25 — End: 2012-10-26

## 2012-10-25 MED ORDER — SODIUM CHLORIDE 0.9 % IV SOLN
INTRAVENOUS | Status: DC
Start: 2012-10-25 — End: 2012-10-26

## 2012-10-25 MED ORDER — SODIUM CHLORIDE 0.9 % IV BOLUS
1000.0000 mL | Freq: Once | INTRAVENOUS | Status: AC
Start: 2012-10-25 — End: 2012-10-25
  Administered 2012-10-25: 1000 mL via INTRAVENOUS

## 2012-10-25 MED ORDER — INSULIN GLARGINE 100 UNIT/ML SC SOLN
20.0000 [IU] | Freq: Every evening | SUBCUTANEOUS | Status: DC
Start: 2012-10-25 — End: 2012-10-25

## 2012-10-25 MED ORDER — INSULIN ASPART 100 UNIT/ML SC SOLN
1.0000 [IU] | Freq: Three times a day (TID) | SUBCUTANEOUS | Status: DC | PRN
Start: 2012-10-25 — End: 2012-10-26
  Administered 2012-10-25: 2 [IU] via SUBCUTANEOUS
  Administered 2012-10-25 (×2): 1 [IU] via SUBCUTANEOUS
  Administered 2012-10-26: 3 [IU] via SUBCUTANEOUS
  Filled 2012-10-25 (×2): qty 10
  Filled 2012-10-25: qty 30
  Filled 2012-10-25: qty 20

## 2012-10-25 MED ORDER — HYDROMORPHONE HCL PF 1 MG/ML IJ SOLN
1.0000 mg | INTRAMUSCULAR | Status: DC | PRN
Start: 2012-10-25 — End: 2012-10-26
  Administered 2012-10-25 (×2): 1 mg via INTRAVENOUS
  Filled 2012-10-25 (×2): qty 1

## 2012-10-25 MED ORDER — DIPHENHYDRAMINE HCL 50 MG/ML IJ SOLN
25.0000 mg | Freq: Four times a day (QID) | INTRAMUSCULAR | Status: DC
Start: 2012-10-25 — End: 2012-10-26
  Administered 2012-10-25 – 2012-10-26 (×6): 25 mg via INTRAVENOUS
  Filled 2012-10-25 (×6): qty 1

## 2012-10-25 MED ORDER — INSULIN ASPART 100 UNIT/ML SC SOLN
1.0000 [IU] | Freq: Every evening | SUBCUTANEOUS | Status: DC | PRN
Start: 2012-10-25 — End: 2012-10-26
  Administered 2012-10-25: 1 [IU] via SUBCUTANEOUS
  Filled 2012-10-25: qty 10

## 2012-10-25 MED ORDER — SODIUM CHLORIDE 0.9 % IV SOLN
500.0000 mg | Freq: Four times a day (QID) | INTRAVENOUS | Status: DC
Start: 2012-10-25 — End: 2012-10-26
  Administered 2012-10-25 – 2012-10-26 (×4): 500 mg via INTRAVENOUS
  Filled 2012-10-25 (×11): qty 500

## 2012-10-25 MED ORDER — GLUCAGON HCL (RDNA) 1 MG IJ SOLR
1.0000 mg | INTRAMUSCULAR | Status: DC | PRN
Start: 2012-10-25 — End: 2012-10-26

## 2012-10-25 MED ORDER — CLONIDINE HCL 0.2 MG/24HR TD PTWK
1.0000 | MEDICATED_PATCH | TRANSDERMAL | Status: DC
Start: 2012-10-25 — End: 2012-10-26
  Administered 2012-10-25: 1 via TRANSDERMAL
  Filled 2012-10-25: qty 1

## 2012-10-25 MED ORDER — PANTOPRAZOLE SODIUM 40 MG IV SOLR
40.0000 mg | Freq: Every day | INTRAVENOUS | Status: DC
Start: 2012-10-25 — End: 2012-10-26
  Administered 2012-10-25 – 2012-10-26 (×2): 40 mg via INTRAVENOUS
  Filled 2012-10-25 (×2): qty 40

## 2012-10-25 MED ORDER — HYDROMORPHONE HCL PF 1 MG/ML IJ SOLN
1.0000 mg | INTRAMUSCULAR | Status: DC | PRN
Start: 2012-10-25 — End: 2012-10-25

## 2012-10-25 MED ORDER — PROMETHAZINE HCL 25 MG/ML IJ SOLN
6.2500 mg | Freq: Four times a day (QID) | INTRAMUSCULAR | Status: DC
Start: 2012-10-25 — End: 2012-10-26
  Administered 2012-10-25 – 2012-10-26 (×6): 6.25 mg via INTRAVENOUS
  Filled 2012-10-25 (×6): qty 1

## 2012-10-25 MED ORDER — INSULIN GLARGINE 100 UNIT/ML SC SOLN
20.0000 [IU] | Freq: Every evening | SUBCUTANEOUS | Status: DC
Start: 2012-10-25 — End: 2012-10-26
  Administered 2012-10-25: 20 [IU] via SUBCUTANEOUS
  Filled 2012-10-25: qty 200

## 2012-10-25 MED ORDER — METHYLPREDNISOLONE SODIUM SUCC 125 MG IJ SOLR
60.0000 mg | Freq: Once | INTRAMUSCULAR | Status: AC
Start: 2012-10-25 — End: 2012-10-25
  Administered 2012-10-25: 60 mg via INTRAVENOUS
  Filled 2012-10-25: qty 2

## 2012-10-25 MED ORDER — HYDROMORPHONE HCL 2 MG PO TABS
2.0000 mg | ORAL_TABLET | ORAL | Status: DC | PRN
Start: 2012-10-25 — End: 2012-10-25

## 2012-10-25 MED ORDER — PROMETHAZINE HCL 25 MG/ML IJ SOLN
12.5000 mg | Freq: Once | INTRAMUSCULAR | Status: AC
Start: 2012-10-25 — End: 2012-10-25
  Administered 2012-10-25: 12.5 mg via INTRAVENOUS
  Filled 2012-10-25: qty 1

## 2012-10-25 MED ORDER — ONDANSETRON HCL 4 MG/2ML IJ SOLN
8.0000 mg | Freq: Three times a day (TID) | INTRAMUSCULAR | Status: DC | PRN
Start: 2012-10-25 — End: 2012-10-26
  Administered 2012-10-25: 8 mg via INTRAVENOUS
  Filled 2012-10-25: qty 4

## 2012-10-25 MED ORDER — INSULIN GLARGINE 100 UNIT/ML SC SOLN
36.0000 [IU] | Freq: Every evening | SUBCUTANEOUS | Status: DC
Start: 2012-10-25 — End: 2012-10-25

## 2012-10-25 NOTE — ED Notes (Signed)
Spoke to Dr Selena Batten re pt  Symptoms med order for vomiting obtained

## 2012-10-25 NOTE — Progress Notes (Signed)
I discussed the case and course of care with Dr Bishow.  I was then able to examine the patient myself and review the history as well as performed a pertinent exam.  No changes to today's resident note.    Lataya Varnell, MD  [facility] Attending

## 2012-10-25 NOTE — Consults (Signed)
[facility] [place]  CONSULTATION NOTE    Date Time: 10/25/2012 12:09 PM  Patient Name: [name]  Requesting Physician: Willia Craze, MD       Reason for Consultation:   Gastroparesis    Assessment and Plan:   Assessment:  [name] is a 37 yo M with gastroparesis, secondary to DM.     1. Gastroparesis, acute exacerbation.  2. Abdominal pain with N/V, 2/2 #1.  3. GERD  4. Chronic constipation  5. DMM2, insulin-dependant, poorly-controlled.    Plan:  1. Ativan 1mg  with benadryl together if continued vomiting. Phenergan 12.5mg  every 6 hours scheduled.   2. Reglan 10mg  IV tid for symptomatic mgmt   3. Continue Erythromycin. Consider evaluation at [facility] for Domperidone.   4. Frequent small low fat meals within diabetic diet. Tight glucose control should help as well   5. Continue PPI bid and Carafate   6. Prn Miralax. Consider Relistor as an outpatient.   7. Avoid narcotics which will slow GI motility and worsen constipation.   8. The patient is stable for discharge from the GI perspective. Will sign off. Follow-up as outpatient.      History:   [name] is a 37 y.o. male who presents to the hospital on 10/25/2012 with nausea and vomiting.     Past Medical History:     Past Medical History   Diagnosis Date   . Diabetes    . Hypertensive disorder    . Gastroparesis        Past Surgical History:     Past Surgical History   Procedure Date   . Cholecystectomy    . Knee arthroscopy w/ acl reconstruction      left   . Egd 08/08/2012     Procedure: EGD;  Surgeon: [name], MD;  [facility];  Service: Gastroenterology;  Laterality: N/A;  w\bx       Family History:     Family History   Problem Relation Age of Onset   . Diabetes Father    . Diabetes Maternal Grandmother    . Diabetes Paternal Grandmother        Social History:     History     Social History   . Marital Status: Single     Spouse Name: N/A     Number of Children: N/A   . Years of  Education: N/A     Social History Main Topics   . Smoking status: Never Smoker    . Smokeless tobacco: Never Used   . Alcohol Use: Yes      Comment: socially, once a week maybe   . Drug Use: No   . Sexually Active: Not on file     Other Topics Concern   . Not on file     Social History Narrative   . No narrative on file       Allergies:   No Known Allergies    Medications:     Current Facility-Administered Medications   Medication Dose Route Frequency   . cloNIDine  1 patch Transdermal Weekly   . diphenhydrAMINE  25 mg Intravenous Q6H   . enoxaparin  40 mg Subcutaneous Daily   . erythromycin  500 mg Intravenous Q6H SCH   . [COMPLETED] HYDROmorphone  1 mg Intravenous Once   . insulin glargine  20 Units Subcutaneous QHS   . LORazepam  1 mg Intravenous Q6H   . [COMPLETED] methylprednisolone  60 mg Intravenous Once   . [COMPLETED] ondansetron  4 mg Intravenous Once   . pantoprazole  40 mg Intravenous Daily   . [COMPLETED] promethazine  12.5 mg Intravenous Once   . promethazine  6.25 mg Intravenous Q6H   . [COMPLETED] sodium chloride  1,000 mL Intravenous Once   . [DISCONTINUED] amLODIPine  10 mg Oral Daily   . [DISCONTINUED] insulin glargine  20 Units Subcutaneous QHS   . [DISCONTINUED] insulin glargine  36 Units Subcutaneous QHS       Review of Systems:   Complete 12 Point ROS negative or normal except those mentioned in HPI above.    Physical Exam:     Filed Vitals:    10/25/12 0746   BP: 164/90   Pulse: 115   Temp: 97.2 F (36.2 C)   Resp: 20   SpO2: 97%       General appearance: Well developed, well nourished, appears stated age and in NAD  Eyes: Sclera anicteric, pink conjunctivae, no ptosis  ENMT: mucous membranes moist, nose and ears appear normal.  Oropharynx clear.  Chest: Non labored respirations, no audible wheezing, no clubbing or cyanosis  CV:  Regular rate and rhythm, no JVD, no LE edema  Abdomen: soft, non-tender, non-distended, no masses or organomegaly  Skin: Normal color and turgor, no rashes, no  suspicious skin lesions noted  Neuro: CN II-XII grossly intact.  No gross movement disorders noted.  Mental status: Appropriate affect, alert and oriented x 3    Labs Reviewed:     Recent Labs   [facility] 10/25/12 0229    WBC 12.91*    HGB 14.8    HCT 44.3    PLT 389    MCV 81.6       Recent Labs   Basename 10/25/12 0229    NA 140    K 4.4    CL 102    CO2 25    BUN 13.0    CREAT 1.5*    GLU 293*    CA 10.2    MG --    PHOS --       Recent Labs   [facility] 10/25/12 0229    AST 14    ALT 19    ALKPHOS 101    BILITOTAL 0.7    BILIDIRECT 0.2    PROT 8.0    ALB 4.5       No results found for this basename: PTT:2,PT:2,INR:2 in the last 72 hours     Radiology:   Radiological Procedure reviewed:  CT A/P 10/08/12: 1. No localizing inflammatory changes to correlate with the patient's   pain. Normal appendix.   2. Other findings as above.

## 2012-10-25 NOTE — Progress Notes (Signed)
FAMILY MEDICINE PROGRESS NOTE    Date Time: 10/25/2012 7:24 AM  Patient Name: [name]  Attending Physician: Willia Craze, MD  Team Contact Info: [employer] [phone] 8 am -5 pm: After hours [phone]      Assessment:     Active Hospital Problems    Diagnosis   . Gastroparesis       37 yo M h/o HTN, DM, recently in hospital for gastroparesis, presents now with intractable vomiting and abdominal 2/2 gastroparesis    Plan:     #Abdominal pain and Emesis - likely secondary to acute gastroparesis flare  -LFTs, lipase normal  -CT from previous admission 10/08/12 showed small hiatal hernia, otherwise normal  -trial of Benadryl IV q6, Ativan q6, and solumedrol 60 x1, used during previous hospitalization  -can consider steroid taper   -scheduled phenergan q 6 hrs  -scheduled erythromycin q6   -EKG with QTc 454,h/o of prolonged QTc on previous admission. Consider repeat EKG in 2-3 days   -Pain: avoid opioids as they will exacerbate gastroparesis  - GI consulted     #DM   -home does of 36u lantus changed to 20u while NPO   -SSI     #GERD:  -continue PPI  -restart Carafate when tolerates PO    #HTN   -Takes clonidine at home   -Ordered Clonidine patch 0.2 mg while NPO   -consider restarting norvasc    #Leukocytosis   -no indications of acute infection. Likely stress response to steroids   -continue to trend CBC    #AKI   -likely 2/2 dehydration, baseline around 1.3-1.4   -continue IVFs   -trend BMP    #Fen/GI   -NPO, advance diet as tolerated to frequent small meals   -MIVF     #PPX   -lovenox            Disposition:     Anticipated discharge needs: none      Subjective     CC: vomiting    HPI/Subjective: Patient actively vomiting. Reports continued upper abdominal pain. No diarrhea.     Review of Systems:   Review of Systems -   + abdominal pain, nausea, vomit  No shortness of breath      Physical Exam:       VITAL SIGNS PHYSICAL EXAM   Temp:  [96.7 F (35.9 C)-98 F (36.7 C)] 96.7 F (35.9 C)  Heart Rate:   [108-116] 108   Resp Rate:  [18] 18   BP: (160-211)/(95-106) 190/95 mmHg       No intake or output data in the 24 hours ending 10/25/12 0724 Physical Exam  Gen: hunched over vomiting, distressed  Cardiac: tachycardic, regular rhythm  Lungs: CTAB, no apparent respiratory distress  Abdomen: soft, mild TTP epigastric region  Ext: no edema BLE's       Meds:     Medications were reviewed.    Labs:       Labs (last 72 hours):      Lab 10/25/12 0229   WBC 12.91*   HGB 14.8   HCT 44.3   PLT 389       No results found for this basename: PT:2,INR:2,PTT:2 in the last 168 hours   Lab 10/25/12 0229   NA 140   K 4.4   CL 102   CO2 25   BUN 13.0   CREAT 1.5*   CA 10.2   ALB 4.5   PROT 8.0   BILITOTAL 0.7   ALKPHOS  101   ALT 19   AST 14   GLU 293*                       Case discussed with: Dr. Theda Belfast    Signed by: Aquilla Hacker, MD PGY-2  Pager # [phone]  [facility]   [address]   [place], [place] [address]

## 2012-10-25 NOTE — H&P (Signed)
ADMISSION HISTORY AND PHYSICAL EXAM    Date Time: 10/25/2012 5:01 AM  Patient Name: [name]  Attending Physician: Daron Offer, MD  Primary Care Physician: [name], MD    CC: vomoting      History of Presenting Illness:   [name] is a 37 y.o. male w h/o HTN, uncontrolled DM, gastroparesis p/w N/V since the evening. Was doing ok since discharge then after a meal this evening started to vomit uncontrollably. Has been feeling sick past few days. Still has pain in his upper abdomen.    Did not see his PCP since d/c from hospital. States he has been taking 36u lantus and novologue sliding scale 3 times daily.    PCP: Dr. Alben Spittle  GI: Dr. Selena Batten, [facility]    Past Medical History:     Past Medical History   Diagnosis Date   . Diabetes    . Hypertensive disorder    . Gastroparesis        Available old records reviewed, including:  Previous admission    Past Surgical History:     Past Surgical History   Procedure Date   . Cholecystectomy    . Knee arthroscopy w/ acl reconstruction      left   . Egd 08/08/2012     Procedure: EGD;  Surgeon: [name], MD;  Location: [facility];  Service: Gastroenterology;  Laterality: N/A;  w\bx       Family History:     Family History   Problem Relation Age of Onset   . Diabetes Father    . Diabetes Maternal Grandmother    . Diabetes Paternal Grandmother        Social History:     History     Social History   . Marital Status: Single     Spouse Name: N/A     Number of Children: N/A   . Years of Education: N/A     Social History Main Topics   . Smoking status: Never Smoker    . Smokeless tobacco: Never Used   . Alcohol Use: Yes      Comment: socially, once a week maybe   . Drug Use: No   . Sexually Active: Not on file     Other Topics Concern   . Not on file     Social History Narrative   . No narrative on file       Allergies:   No Known Allergies    Medications:     Prior to Admission medications    Medication Sig Start Date End Date Taking?  Authorizing Provider   amLODIPine (NORVASC) 5 MG tablet Take 2 tablets (10 mg total) by mouth daily. 10/10/12   Noyes, Orland Jarred, MD   cloNIDine (CATAPRES) 0.1 MG tablet Take 1 tablet (0.1 mg total) by mouth every 12 (twelve) hours. 08/10/12   Anselmo Pickler, MD   erythromycin base (E-MYCIN) 250 MG tablet Take 1 tablet (250 mg total) by mouth 4 (four) times daily. 10/10/12 11/09/12  Noyes, Orland Jarred, MD   insulin glargine (LANTUS) 100 UNIT/ML injection Inject 20 Units into the skin nightly. 10/10/12   Noyes, Orland Jarred, MD   metoclopramide (REGLAN) 5 MG tablet Take 2 tablets (10 mg total) by mouth 3 (three) times daily. 10/10/12 11/09/12  Noyes, Orland Jarred, MD   ondansetron (ZOFRAN ODT) 4 MG disintegrating tablet Take 1 tablet (4 mg total) by mouth every 8 (eight) hours as  needed for Nausea. 10/10/12 11/09/12  Noyes, Orland Jarred, MD   pantoprazole (PROTONIX) 40 MG tablet Take 1 tablet (40 mg total) by mouth 2 (two) times daily. 10/07/12   Noyes, Orland Jarred, MD   sucralfate (CARAFATE) 1 G tablet Take 1 tablet (1 g total) by mouth 4 (four) times daily. 10/07/12   Noyes, Orland Jarred, MD   traMADol (ULTRAM) 50 MG tablet Take 2 tablets (100 mg total) by mouth every 6 (six) hours as needed. 10/10/12   Kristine Linea, MD       Review of Systems:   All other systems were reviewed and are negative except: as per HPI    Physical Exam:   Patient Vitals for the past 24 hrs:   BP Temp Pulse Resp SpO2 Height Weight   10/25/12 0435 172/106 mmHg - 108  18  99 % - -   10/25/12 0210 160/96 mmHg 98 F (36.7 C) 116  18  100 % 1.905 m (6\' 3" ) 127.007 kg (280 lb)     Body mass index is 35.00 kg/(m^2).  No intake or output data in the 24 hours ending 10/25/12 0501    General: awake, alert, oriented x 3; no acute distress. Given Dilaudid prior to exam  HEENT: perrla, eomi, sclera anicteric  oropharynx clear without lesions, mucous membranes moist  Neck: supple, no lymphadenopathy,   Cardiovascular: tachycardic, no murmurs, rubs or gallops  Lungs:  clear to auscultation bilaterally, without wheezing, rhonchi, or rales  Abdomen: soft, non-tender, non-distended; no palpable masses, no hepatosplenomegaly, normoactive bowel sounds, no rebound or guarding  Extremities: no clubbing, cyanosis, or edema  Skin: no rashes or lesions noted        Labs:     Results     Procedure Component Value Units Date/Time    Basic Metabolic Panel [[id number]]  (Abnormal) Collected:10/25/12 0229    Specimen Information:Blood Updated:10/25/12 0302     Glucose 293 (H) mg/dL      BUN 75.6 mg/dL      Creatinine 1.5 (H) mg/dL      CALCIUM 43.3 mg/dL      Sodium 295      Potassium 4.4      Chloride 102      CO2 25     Hepatic function panel (LFT) [[id number]] Collected:10/25/12 0229    Specimen Information:Blood Updated:10/25/12 0302     Bilirubin, Total 0.7 mg/dL      Bilirubin, Direct 0.2 mg/dL      Bilirubin, Indirect 0.5 mg/dL      AST (SGOT) 14 U/L      ALT 19 U/L      Alkaline Phosphatase 101 U/L      Protein, Total 8.0 g/dL      Albumin 4.5 g/dL      Globulin 3.5 g/dL      Albumin/Globulin Ratio 1.3     Lipase [[id number]] Collected:10/25/12 0229    Specimen Information:Blood Updated:10/25/12 0302     Lipase 41 U/L     GFR [[id number]] Collected:10/25/12 0229     EGFR >60.0 Updated:10/25/12 0302    CBC and differential [[id number]]  (Abnormal) Collected:10/25/12 0229    Specimen Information:Blood / Blood Updated:10/25/12 0247     WBC 12.91 (H)      RBC 5.43      Hgb 14.8 g/dL      Hematocrit 23.5 %      MCV 81.6 fL      MCH 27.3 (L) pg  MCHC 33.4 g/dL      RDW 13 %      Platelets 389      MPV 9.7 fL      Neutrophils 86 %      Lymphocytes Automated 10 %      Monocytes 3 %      Eosinophils Automated 0 %      Basophils Automated 0 %      Immature Granulocyte 0 %      Nucleated RBC 0      Neutrophils Absolute 11.09 (H)      Abs Lymph Automated 1.29      Abs Mono Automated 0.43      Abs Eos Automated 0.02      Absolute Baso Automated 0.03      Absolute Immature Granulocyte 0.05      Accucheck [[id number]]  (Abnormal) Collected:10/25/12 0210     POCT Glucose WB 262 (A) mg/dL [id number]:14/78/29 5621              Assessment:     Patient Active Problem List    Diagnosis Date Noted   . Intractable nausea and vomiting 10/05/2012   . Diabetic gastroparesis 08/22/2012   . Intractable vomiting 08/05/2012   . Abdominal pain 08/05/2012   . DM (diabetes mellitus), type 1, uncontrolled with complications 08/05/2012   . HTN (hypertension) 08/05/2012   . Type 2 diabetes, uncontrolled, with neuropathy 06/27/2012   . Hypertension 06/27/2012   . Nausea & vomiting 06/27/2012   . GERD (gastroesophageal reflux disease) 06/17/2012   . Gastroparesis 06/17/2012   . Dehydration, moderate 06/17/2012   . Gastritis without bleeding 06/17/2012       37 yo M h/o HTN, DM, recently in hospital for gastroparesis, presents now with intractable vomiting and abdominal 2/2 gastroparesis.    Plan:   #Gastroparesis  -trial of Benadryl IV q6, Ativan q6, and solumedrol 60 x1  -can consider steroid taper   -scheduled phenergan  -erythromycin q6  -will order EKG, has h/o of prolonged QTc on previous admission  -can consider GI consult in am ([facility])    #DM  -home does of 36u lantus changed to 20u while NPO  -SSI    #Pain  -dilaudid q2 prn  -once able to take PO would try naproxen to help minimize narcotic use    #HTN  -Takes clonidine at home  -Ordered Clonidine patch 0.2 mg while NPO    #Leukocytosis  -no indications of acute infection. Likely stress response    #AKI  -likely 2/2 dehydration, base line around 1.3-1.4    #Fen/GI  -NPO  -MIVF    #PPX  -lovenox 30  -protonix D        Signed by: Barnie Alderman, MD PGY2  [facility]  [id number], Vania Rea, MD

## 2012-10-25 NOTE — ED Notes (Signed)
C/o vomiting since 1900 yesterday. Hx DM and gastroparesis. Recent hospitlaization for same 2 weeks ago . +chills +abdominal pain

## 2012-10-25 NOTE — Plan of Care (Signed)
Problem: Pain  Goal: Patient's pain/discomfort is manageable  Outcome: Progressing  Pt states abdominal pain that is relieved by IV dilaudid given in the ER. MD was called to change po to iv r/t intermittent vomiting upon arrival to the floor. Brown liquid emesis noted of moderate amount. MD here to place orders when called. Awaiting Rx verification, paged once, called twice, still waiting for availability.continue to monitor and manage pain as needed.    Problem: Moderate/High Fall Risk Score >/=15  Goal: Patient will remain free of falls  Outcome: Progressing  Floor mat placed for safety, multiple medications scheduled simultaneously. Fall and safety precautions reviewed with the pt. Maintain safety, continue hourly rounding, call light is within reach.

## 2012-10-25 NOTE — Plan of Care (Signed)
Problem: Health Promotion  Goal: Knowledge - disease process  Extent of understanding conveyed about a specific disease process.   Outcome: Progressing  BG managed by insulin PRN. Tolerated well.   Emesis X1. Zofran IV administered. Tolerated well. Continue to monitor.     Problem: Safety  Goal: Patient will be free from injury during hospitalization  Outcome: Progressing  Falls mat and arm band in place. Free from falls during hourly rounds.    Problem: Pain  Goal: Patient's pain/discomfort is manageable  Outcome: Progressing  C/O pain. Managed by Dilaudid IV. See orders. Continue to monitor.

## 2012-10-25 NOTE — Progress Notes (Signed)
PGY-3 ADDENDUM    See full H&P per Dr. Arminda Resides.  I have discussed and duplicated the history and physical exam.   I agree with assessment and plan as outlined, with the following additions:    Patient is a 37 y/o M with hx HTN, DM2, neuropathy and gastroparesis who presents with nausea and vomiting and worsening abdominal pain. Patient was recently discharged from the hospital two weeks ago for similar symptoms. During that hospitalization, he required steroids with scheduled ativan and benedryl. He states he had been doing well at home until today, reports eating chinese food that he thinks triggered his symptoms. He reports taking his medications including his insulin at home. In there ER, workup revealed mild leukocytosis, likely 2/2 stress response, with AKI and hyperglycemia.    Filed Vitals:    10/25/12 0508   BP: 211/106   Pulse: 108   Temp: 96.7 F (35.9 C)   Resp: 18   SpO2: 97%     Gen: Lying in bed, NAD  HEENT: NCAT, dry mucous membranes  Cardio: RRR, no murmur  Resp: CTAB  Abd: Soft, ND, diffuse TTP without rebound or guarding, bowel sounds present  Extr: warm, well perfused, no edema    A/P: 37 y/o M with gastroparesis flare  -Solumedrol IV x 1   -Scheduled Ativan with Benedryl q6 hours  -Scheduled phenergan q6 hours  -Will check EKG to evaluate Qt due to hx of prolonged Qt in the past  -NPO with MIVF  -IV Protonix  -Change home clonidine to patch, hydralazine PRN  -Resume home Norvasc once taking PO  -Decrease home lantus to 20 units QHS while NPO, accuchecks with SSI  -Likely GI consult in the AM    Imagene Sheller, [place]  [facility]  [phone]

## 2012-10-25 NOTE — ED Provider Notes (Signed)
Physician/Midlevel provider first contact with patient: 10/25/12 0345         Diagnosis   1. Gastroparesis    2. Abdominal pain    3. Nausea & vomiting    4. Hyperglycemia    5. Renal insufficiency        Disposition   ED Disposition     Admit Bed Type: General [8]  Admitting Physician: Willia Craze [[id number]]  Patient Class: Hospital Outpatient Surgery (Amb Proc) [[id number]]             Meds administered in ER:     Medications   LORazepam (ATIVAN) 0.5 MG tablet (not administered)   0.9%  NaCl infusion (  Intravenous New Bag 10/25/12 1331)   promethazine (PHENERGAN) injection 6.25 mg (6.25 mg Intravenous Given 10/25/12 1743)   enoxaparin (LOVENOX) syringe 40 mg (40 mg Subcutaneous Given 10/25/12 1010)   pantoprazole (PROTONIX) injection 40 mg (40 mg Intravenous Given 10/25/12 1010)   erythromycin (ERYTHROCIN) 500 mg in sodium chloride 0.9 % 250 mL IVPB (500 mg Intravenous New Bag 10/25/12 1620)   dextrose (GLUCOSE) 40 % oral gel 15 g (not administered)   dextrose 50 % bolus 25 mL (not administered)   glucagon (rDNA) (GLUCAGEN) injection 1 mg (not administered)   LORazepam (ATIVAN) injection 1 mg (1 mg Intravenous Given 10/25/12 1744)   diphenhydrAMINE (BENADRYL) injection 25 mg (25 mg Intravenous Given 10/25/12 1743)   hydrALAZINE (APRESOLINE) injection 10 mg (10 mg Intravenous Given 10/25/12 0705)   insulin glargine (LANTUS) injection 20 Units (20 Units Subcutaneous Not Given 10/25/12 0639)   insulin aspart (NovoLOG) injection 1-8 Units (1 Units Subcutaneous Given 10/25/12 1748)   insulin aspart (NovoLOG) injection 1-4 Units (1 Units Subcutaneous Canceled Entry 10/25/12 1743)   cloNIDine (CATAPRES-TTS-2) 0.2 MG/24HR 1 patch (1 patch Transdermal Patch Applied 10/25/12 0703)   HYDROmorphone (DILAUDID) injection 1 mg (1 mg Intravenous Given 10/25/12 0843)   traMADol (ULTRAM) tablet 50 mg (not administered)   ondansetron (ZOFRAN) injection 8 mg (8 mg Intravenous Given 10/25/12 0843)   sucralfate (CARAFATE) tablet 1 g (1 g Oral Given  10/25/12 1620)   ondansetron (ZOFRAN) injection 4 mg (4 mg Intravenous Given 10/25/12 0237)   sodium chloride 0.9 % bolus 1,000 mL (1000 mL Intravenous New Bag 10/25/12 0354)   HYDROmorphone (DILAUDID) injection 1 mg (1 mg Intravenous Given 10/25/12 0354)   promethazine (PHENERGAN) injection 12.5 mg (12.5 mg Intravenous Given 10/25/12 0354)   methylprednisolone sodium succinate (Solu-MEDROL) injection 60 mg (60 mg Intravenous Given 10/25/12 0630)     New Prescriptions    No medications on file      __________________________________________________   Chief Complaint  Chief Complaint   Patient presents with   . Abdominal Pain         HPI   [name] is a 37 y.o. male with PMHx of DM takes lantus, hypertensive disorder, gastroparesis c/o upper abdominal pain and intractable nausea/vomiting since 1900 yesterday per pt exact same as prior gastroparesis. He was recently hospitalized for same sxs two weeks ago. States feels exactly like then. No recent abdominal surgeries. No other complaints. No low abd pain/dysuria/flank pain.      PMD   [name], MD    ROS   -Documented in HPI. Otherwise all other systems reviewed and negative.   -Nursing notes reviewed by me.     -Past Medical/Surgical/Family/Social History reviewed by me     PE  Filed Vitals:    10/25/12 1247  10/25/12 1520 10/25/12 1941 10/25/12 2137   BP: 126/76 143/85 140/83 142/82   Pulse: 100 100 110 109   Temp: 97.1 F (36.2 C) 96.7 F (35.9 C) 96.8 F (36 C) 97.4 F (36.3 C)   TempSrc:       Resp: 18 16 18 18    Height:       Weight:       SpO2: 96% 96% 100% 97%        Vitals reviewed and interpreted by me  GEN: . Nontoxic, Well appearing, NAD, very pleasant.     Head: NC/AT.     Eyes: EOMI w/o pain, nl conjunctiva. NO d/c.    ENT: MMM, symmetric OP, no drooling/trismus.    Neck: FROM, supple, no masses, no tenderness.  Chest: ctab, NO resp distress. Equal chest rise.   CV: rrr, NO significant murmur appreciated.     Abd: no peritoneal signs, benign,  no distention, soft, mild epigastric tenderness, no RUQ ttp, no low abd ttp   Back: NO CVAt     UpperExt: NO deformity. FROM, neurovasc intact.       LowerExt: NO edema. FROM, neurovasc intact.        Neuro: moving all ext equally, no motor deficits    Skin: Warm and dry. NO rash.        Psych: Normal affect. Normal insight.          Differential Diagnosis (not completely inclusive):   C/w gastroparesis vs gastritis  Not c/w perforation, gib, volvulus, bowel obstruction    MDM / ED Course:   FFP Resident aware, well known to service  Will admit med bed      Critical Care Time: No Critical Care Time    EKG - Reviewed and Interpreted by me: Bernarda Caffey, M.D.   N/A    Pulse Oximetry Analysis interpreted by me - Normal on RA 100%  Cardiac Monitor Analysis interpreted by me - Abnormal Sinus Rhythm at rate of 110s  Laboratory results reviewed by me:  yes  Radiologic study results reviewed by me: N\A  Radiologic Studies Interpreted (viewed) by me: N\A   Radiologic Interpretation by me: N/A    Bernarda Caffey, MD   _________________________________________________________________   BP 142/82  Pulse 109  Temp 97.4 F (36.3 C) (Oral)  Resp 18  Ht 1.905 m  Wt 127.007 kg  BMI 35.00 kg/m2  SpO2 97%   Past Medical History   Diagnosis Date   . Diabetes    . Hypertensive disorder    . Gastroparesis       Past Surgical History   Procedure Date   . Cholecystectomy    . Knee arthroscopy w/ acl reconstruction      left   . Egd 08/08/2012     Procedure: EGD;  Surgeon: [name], MD;  Location: [facility];  Service: Gastroenterology;  Laterality: N/A;  w\bx      Family History   Problem Relation Age of Onset   . Diabetes Father    . Diabetes Maternal Grandmother    . Diabetes Paternal Grandmother       History     Social History   . Marital Status: Single     Spouse Name: N/A     Number of Children: N/A   . Years of Education: N/A     Social History Main Topics   . Smoking status: Never Smoker    . Smokeless tobacco: Never  Used   .  Alcohol Use: Yes      Comment: socially, once a week maybe   . Drug Use: No   . Sexually Active: Not on file     Other Topics Concern   . Not on file     Social History Narrative   . No narrative on file     Labs:   Labs Reviewed   POCT GLUCOSE - Abnormal; Notable for the following:     POCT Glucose WB 262 (*)     All other components within normal limits   CBC AND DIFFERENTIAL - Abnormal; Notable for the following:     WBC 12.91 (*)     MCH 27.3 (*)     Neutrophils Absolute 11.09 (*)     All other components within normal limits   BASIC METABOLIC PANEL - Abnormal; Notable for the following:     Glucose 293 (*)     Creatinine 1.5 (*)     All other components within normal limits   URINALYSIS WITH MICROSCOPIC - Abnormal; Notable for the following:     Glucose, UA 1000 (*)     Ketones UA Trace (*)     All other components within normal limits   POCT GLUCOSE - Abnormal; Notable for the following:     POCT Glucose WB 198 (*)     All other components within normal limits   POCT GLUCOSE - Abnormal; Notable for the following:     POCT Glucose WB 243 (*)     All other components within normal limits   POCT GLUCOSE - Abnormal; Notable for the following:     POCT Glucose WB 200 (*)     All other components within normal limits   HEPATIC FUNCTION PANEL   LIPASE   GFR   POCT GLUCOSE   POCT GLUCOSE   POCT GLUCOSE     Radiology Results (24 Hour)     ** No Results found for the last 24 hours. **        ___________________________________________________________________  I was acting as a Neurosurgeon for Daron Offer, MD on [name]   I am the first provider for this patient and I personally performed the services documented. Hosie Spangle is scribing for me on [name],[name] L. This note accurately reflects work and decisions made by me.  Daron Offer, MD    ___________________________________________________________________            Daron Offer, MD  10/25/12 [phone]

## 2012-10-26 LAB — POCT GLUCOSE
Whole Blood Glucose POCT: 162 mg/dL — AB (ref 70–100)
Whole Blood Glucose POCT: 135 mg/dL — AB (ref 70–100)
Whole Blood Glucose POCT: 204 mg/dL — AB (ref 70–100)

## 2012-10-26 LAB — CBC
Hematocrit: 38.6 % — ABNORMAL LOW (ref 42.0–52.0)
Hgb: 12.3 g/dL — ABNORMAL LOW (ref 13.0–17.0)
MCH: 26.3 pg — ABNORMAL LOW (ref 28.0–32.0)
MCHC: 31.9 g/dL — ABNORMAL LOW (ref 32.0–36.0)
MCV: 82.7 fL (ref 80.0–100.0)
MPV: 9.6 fL (ref 9.4–12.3)
Nucleated RBC: 0 (ref 0–1)
Platelets: 334 (ref 140–400)
RBC: 4.67 — ABNORMAL LOW (ref 4.70–6.00)
RDW: 13 % (ref 12–15)
WBC: 12.47 — ABNORMAL HIGH (ref 3.50–10.80)

## 2012-10-26 LAB — BASIC METABOLIC PANEL
BUN: 14 mg/dL (ref 9.0–21.0)
CO2: 24 (ref 22–29)
Calcium: 9 mg/dL (ref 8.5–10.5)
Chloride: 107 (ref 98–107)
Creatinine: 1.1 mg/dL (ref 0.7–1.3)
Glucose: 157 mg/dL — ABNORMAL HIGH (ref 70–100)
Potassium: 4.4 (ref 3.5–5.1)
Sodium: 139 (ref 136–145)

## 2012-10-26 LAB — GFR: EGFR: >60

## 2012-10-26 LAB — MAGNESIUM: Magnesium: 1.9 mg/dL (ref 1.6–2.6)

## 2012-10-26 MED ORDER — CLONIDINE HCL 0.1 MG PO TABS
0.1000 mg | ORAL_TABLET | Freq: Two times a day (BID) | ORAL | Status: DC
Start: 2012-10-26 — End: 2013-01-19

## 2012-10-26 MED ORDER — AMLODIPINE BESYLATE 5 MG PO TABS
5.0000 mg | ORAL_TABLET | Freq: Every day | ORAL | Status: DC
Start: 2012-10-26 — End: 2012-10-26
  Administered 2012-10-26: 5 mg via ORAL
  Filled 2012-10-26: qty 1

## 2012-10-26 MED ORDER — ERYTHROMYCIN BASE 250 MG PO TABS
250.0000 mg | ORAL_TABLET | Freq: Four times a day (QID) | ORAL | Status: AC
Start: 2012-10-26 — End: 2012-11-25

## 2012-10-26 MED ORDER — SODIUM CHLORIDE 0.9 % IV SOLN
500.0000 mg | Freq: Four times a day (QID) | INTRAVENOUS | Status: DC
Start: 2012-10-26 — End: 2012-10-26
  Administered 2012-10-26: 500 mg via INTRAVENOUS
  Filled 2012-10-26 (×6): qty 500

## 2012-10-26 MED ORDER — PROMETHAZINE HCL 12.5 MG PO TABS
12.5000 mg | ORAL_TABLET | Freq: Four times a day (QID) | ORAL | Status: DC
Start: 2012-10-26 — End: 2013-01-19

## 2012-10-26 MED ORDER — LORAZEPAM 0.5 MG PO TABS
0.5000 mg | ORAL_TABLET | Freq: Four times a day (QID) | ORAL | Status: DC | PRN
Start: 2012-10-26 — End: 2013-02-08

## 2012-10-26 MED ORDER — PROMETHAZINE HCL 12.5 MG PO TABS
12.5000 mg | ORAL_TABLET | Freq: Four times a day (QID) | ORAL | Status: DC | PRN
Start: 2012-10-26 — End: 2012-10-26

## 2012-10-26 NOTE — Discharge Summary -  Nursing (Signed)
Discussed discharge orders and prescriptions with pt and family by the bedside. Verbalized understanding. IV removed. Site intact. No redness or swelling noted. Tolerated well. Transport/volunteer services called. Off unit via wheelchair.

## 2012-10-26 NOTE — Discharge Summary (Signed)
FAMILY MEDICINE DISCHARGE SUMMARY    Date Time: 10/26/2012 7:10 PM  Patient Name: [name]  Attending Physician: No att. providers found  Primary Care Physician: [name], MD    Date of Admission: 10/25/2012  Date of Discharge: 10/26/12    Discharge Diagnoses:     Active Problems:   Gastroparesis  Resolved Problems:   * No resolved hospital problems. *       Disposition:     home with family    1.     Recent Labs - Last 2:         Lab 10/26/12 0412 10/25/12 0229   WBC 12.47* 12.91*   HGB 12.3* 14.8   HCT 38.6* 44.3   PLT 334 389       No results found for this basename: PT,INR,PTT in the last 168 hours    Lab 10/25/12 0229   ALKPHOS 101   BILITOTAL 0.7   BILIDIRECT 0.2   PROT 8.0   ALB 4.5   ALT 19   AST 14        Lab 10/26/12 0412 10/25/12 0229   NA 139 140   K 4.4 4.4   CL 107 102   CO2 24 25   BUN 14.0 13.0   GLU 157* 293*   CA 9.0 10.2   MG 1.9 --   PHOS -- --       No results found for this basename: CHOL,TRIG,HDL,LDL in the last 168 hours  No results found for this basename: TSH,FREET3,FREET4 in the last 168 hours         Procedures/Radiology performed:     None       Hospital Course:     Reason for admission/ HPI:     [name] is a 37 y.o. male w h/o HTN, uncontrolled DM, gastroparesis p/w N/V since the evening. Was doing ok since discharge then after a meal this evening started to vomit uncontrollably. Has been feeling sick past few days. Still has pain in his upper abdomen.   Did not see his PCP since d/c from hospital. States he has been taking 36u lantus and novologue sliding scale 3 times daily.       Hospital Course:    [name] is a 37 yo M h/o HTN, DM, recently in hospital for gastroparesis, presented with intractable vomiting and abdominal. He had a mild leukocytosis thought secondary to steroids, normal LFTs and Lipase. He had a mild AKI, likely pre-renal etiology in setting of dehydration. Abdominal pain and emesis were thought to be secondary to acute gastroparesis flare.  IVFs were started and he was kept NPO. He was started on a trial of Benadryl IV q6, Ativan q6, and solumedrol 60 x1, in addition to scheduled phenergan and erythromycin. This is a similar regimen used during previous hospitalization which had improved his symptoms. He was also continued on home PPI and carafate for GERD. GI consulted for further recommendations. His abdominal pain, nausea/vomit improved after 24 hours on this regimen. Creatinine trended down. Diet advanced and patient tolerated without any further abdominal pain or vomiting. Blood sugars remains well controlled.  He was discharged home with instructions to continue scheduled phenergan and erythromycin, as well as home PPI and carafate. If vomiting recurs, instructed to take Ativan with Benadryl. He was advised to eat small, frequent, low fat meals/diabetic diet throughout the day. Recommended follow-up with PCP and GI Dr. Jens Som. GI could consider evaluation at [facility] for Domperidone in  future.      Consultations:     Treatment Team: Consulting Physician: Lilia Pro, MD; Technician: Adrian Blackwater; Registered Nurse: Marcy Salvo, RN    Discharge Condition:     stable    Discharge Instructions & Follow Up Plan for Patient:     Diet: small, frequent low fat/diabetic diet meals    Activity/Weight Bearing Status: no restrictions    Patient was instructed to follow up with:   Primary Care Doctor [name], MD in  3 days & the following Consultants:  1. Dr. Jens Som - GI    Discharge Code Status:Full      Complete instructions and follow up are in the patient's After Visit Summary    Minutes spent coordinating discharge and reviewing discharge plan: 20 minutes    Discharge Medications:        Medication List       As of 10/26/2012  7:20 PM      START taking these medications           promethazine 12.5 MG tablet    Commonly known as: PHENERGAN    Take 1 tablet (12.5 mg total) by mouth every 6 (six) hours.         CONTINUE taking these  medications           amLODIPine 5 MG tablet    Commonly known as: NORVASC    Take 2 tablets (10 mg total) by mouth daily.        cloNIDine 0.1 MG tablet    Commonly known as: CATAPRES    Take 1 tablet (0.1 mg total) by mouth every 12 (twelve) hours.        erythromycin base 250 MG tablet    Commonly known as: E-MYCIN    Take 1 tablet (250 mg total) by mouth 4 (four) times daily.        insulin glargine 100 UNIT/ML injection    Commonly known as: LANTUS    Inject 20 Units into the skin nightly.        LORazepam 0.5 MG tablet    Commonly known as: ATIVAN    Take 1 tablet (0.5 mg total) by mouth every 6 (six) hours as needed. PRN cyclic vomiting        metoclopramide 5 MG tablet    Commonly known as: REGLAN    Take 2 tablets (10 mg total) by mouth 3 (three) times daily.        ondansetron 4 MG disintegrating tablet    Commonly known as: ZOFRAN-ODT    Take 1 tablet (4 mg total) by mouth every 8 (eight) hours as needed for Nausea.        pantoprazole 40 MG tablet    Commonly known as: PROTONIX    Take 1 tablet (40 mg total) by mouth 2 (two) times daily.        sucralfate 1 G tablet    Commonly known as: CARAFATE    Take 1 tablet (1 g total) by mouth 4 (four) times daily.        traMADol 50 MG tablet    Commonly known as: ULTRAM    Take 2 tablets (100 mg total) by mouth every 6 (six) hours as needed.               Where to get your medications     These are the prescriptions that you need to pick up.    You may get these  medications from any pharmacy.           cloNIDine 0.1 MG tablet    erythromycin base 250 MG tablet    LORazepam 0.5 MG tablet    promethazine 12.5 MG tablet                       Immunizations provided: none      [facility] [address]   [place], [place] [address]  [phone]    Signed by: Aquilla Hacker, MD    CC: [name], MD

## 2012-10-26 NOTE — Progress Notes (Signed)
[facility]  PROGRESS NOTE    Date Time: 10/26/2012 12:15 PM  Patient Name: [name]      Chief Complaint:   Gastroparesis    Assessment and Plan:   Assessment:   Mr. Cicero is a 37 yo M with gastroparesis, secondary to DM.     1. Gastroparesis, acute exacerbation, improving.   2. Abdominal pain with N/V, improving, 2/2 #1.   3. GERD   4. Chronic constipation   5. DMM2, insulin-dependant, poorly-controlled.     Plan:   1. Continue Ativan 1mg  prn with benadryl if vomiting recurs. 2. Phenergan 12.5mg  every 6 hours scheduled.   3. D/c Reglan given concern for tardive dyskinesia.  4. Continue Erythromycin 250 mg QID.  5. Outpatient GI f/u with Dr. Jens Som. Consider evaluation at [facility] for Domperidone.   6. Frequent small low fat meals within diabetic diet. Tight glucose control should help as well   6. Continue PPI bid and Carafate   7. Miralax 17 grams BID. Consider Relistor as an outpatient.   8. Avoid narcotics which will slow GI motility and worsen constipation.   9. The patient is stable for discharge from the GI perspective. Will sign off.     Subjective:   Mr. Rathman states he is feeling well this AM. He notes resolution of N/V and abdominal pain. His last BM was 2 days ago with formed brown stools. He denies melena and hematochezia. He also denies abdominal bloating and distension, as well as dysphagia.   He has been tolerating a low-fat diet and has been afebrile.     Medications:     Current Facility-Administered Medications   Medication Dose Route Frequency   . amLODIPine  5 mg Oral Daily   . cloNIDine  1 patch Transdermal Weekly   . diphenhydrAMINE  25 mg Intravenous Q6H   . enoxaparin  40 mg Subcutaneous Daily   . erythromycin IVPB  500 mg Intravenous Q6H SCH   . insulin glargine  20 Units Subcutaneous QHS   . LORazepam  1 mg Intravenous Q6H   . pantoprazole  40 mg Intravenous Daily   . promethazine  6.25 mg Intravenous Q6H   . sucralfate  1 g Oral TID AC & HS    . [DISCONTINUED] erythromycin  500 mg Intravenous Q6H SCH       Review of Systems:   Complete 12 Point ROS negative or normal except those mentioned in HPI above.    Physical Exam:     Filed Vitals:    10/26/12 1200   BP: 152/87   Pulse: 90   Temp: 96.2 F (35.7 C)   Resp: 16   SpO2: 94%     General appearance: Well developed, well nourished, appears stated age and in NAD   Eyes: Sclera anicteric, pink conjunctivae, no ptosis   ENMT: mucous membranes moist, nose and ears appear normal. Oropharynx clear.   Chest: Non labored respirations, no audible wheezing, no clubbing or cyanosis   CV: no JVD, no LE edema   Abdomen: soft, non-tender throughout, non-distended, no masses or organomegaly   Skin: No pallorl, Normal turgor, no rashes, no suspicious skin lesions noted   Neuro: CN II-XII grossly intact. No gross movement disorders noted.   Mental status: Appropriate affect, alert and oriented x 3    Labs:     Recent Labs   [facility] 10/26/12 0412 10/25/12 0229    WBC 12.47* 12.91*    HGB 12.3* 14.8  HCT 38.6* 44.3    PLT 334 389    MCV 82.7 81.6       Recent Labs   Basename 10/26/12 0412 10/25/12 0229    NA 139 140    K 4.4 4.4    CL 107 102    CO2 24 25    BUN 14.0 13.0    CREAT 1.1 1.5*    GLU 157* 293*    CA 9.0 10.2    MG 1.9 --    PHOS -- --       Recent Labs   [facility] 10/25/12 0229    AST 14    ALT 19    ALKPHOS 101    BILITOTAL 0.7    BILIDIRECT 0.2    PROT 8.0    ALB 4.5       No results found for this basename: PTT:2,PT:2,INR:2 in the last 72 hours     Radiology:   Radiological Procedure reviewed:  CT A/P 10/08/12: 1. No localizing inflammatory changes to correlate with the patient's   pain. Normal appendix.   2. Other findings as above.

## 2012-10-26 NOTE — Plan of Care (Signed)
Problem: Health Promotion  Goal: Knowledge - disease process  Extent of understanding conveyed about a specific disease process.   Outcome: Progressing  BG managed by insulin PRN. See orders.Tolerated well.     Problem: Safety  Goal: Patient will be free from injury during hospitalization  Outcome: Progressing  Falls mat & arm band in place. Ambulated in rm. Steady gait noted. Tolerated well.     Problem: Pain  Goal: Patient's pain/discomfort is manageable  Outcome: Progressing  Denied pain during shift.

## 2012-10-26 NOTE — Plan of Care (Signed)
Problem: Health Promotion  Goal: Knowledge - disease process  Extent of understanding conveyed about a specific disease process.   Outcome: Progressing  VSS. Pt afebrile. Pt had several episodes of emesis in the evening. Scheduled ativan, benadryl, and phenergan administered as ordered with positive effect. Erythromycin administration delayed due to pharmacy unavailability. Medication re timed based on actual administration time.    Problem: Safety  Goal: Patient will be free from injury during hospitalization  Outcome: Progressing  Pt is moderate falls risk. Safety precautions reviewed. Nonskid footwear and falls mat in place.    Problem: Pain  Goal: Patient's pain/discomfort is manageable  Outcome: Progressing  C/o abdominal pain in the evening. PRN dilaudid given x 1 with positive effect.    Problem: Psychosocial and Spiritual Needs  Goal: Demonstrates ability to cope with hospitalization/illness  Outcome: Progressing  Friend at bedside at start of shift. Pt slept well overnight.

## 2012-10-26 NOTE — Progress Notes (Signed)
FAMILY MEDICINE PROGRESS NOTE    Date Time: 10/26/2012 7:43 AM  Patient Name: [name]  Attending Physician: Willia Craze, MD  Team Contact Info: [employer] [phone] 8 am -5 pm: After hours [phone]      Assessment:     Active Hospital Problems    Diagnosis   . Gastroparesis       37 yo M h/o HTN, DM, recently in hospital for gastroparesis, presented with intractable vomiting and abdominal 2/2 acute gastroparesis flare, now improving.    Plan:     #Abdominal pain and Emesis - likely secondary to acute gastroparesis flare, now improving.  -LFTs, lipase normal  -CT from previous admission 10/08/12 showed small hiatal hernia, otherwise normal  -trial of Benadryl IV q6, Ativan q6, and solumedrol 60 x1, regimen used during previous hospitalization  -scheduled phenergan q 6 hrs  -scheduled erythromycin q6   -EKG with QTc 454 ,h/o of prolonged QTc on previous admission   -Pain: avoid opioids as they will exacerbate gastroparesis  - GI consulted     #DM   -home does of 36u lantus changed to 20u while slowing increasing PO  -SSI     #GERD:  -continue PPI  -restart Carafate when tolerates PO    #HTN   -Takes clonidine at home   -Ordered Clonidine patch 0.2 mg while not tolerating PO  -Will restart home amlodipine    #Leukocytosis   -no indications of acute infection. Likely stress response to steroids   -continue to trend CBC    #AKI -improving  -likely 2/2 dehydration, baseline around 1.3-1.4   -Creatinine trending down 1.5 to 1.1 today  -decrease IVFs   -trend BMP    #Fen/GI   -advance diet as tolerated to frequent small meals, low fat diet  -MIVF     #PPX   -lovenox      #Dispo: Anticipate discharge today if pain improves and tolerates PO      Disposition:     Anticipated discharge needs: none      Subjective     CC: vomiting    HPI/Subjective: Reports feeling much better this am. Abdominal pain has resolved. He has not vomited for many hours. No BM's during this admission. He is hoping he can go home today and  would like to try to eat something this morning.    Review of Systems:   Review of Systems -   + abdominal pain, nausea, vomit  No shortness of breath      Physical Exam:       VITAL SIGNS PHYSICAL EXAM   Temp:  [96.2 F (35.7 C)-97.4 F (36.3 C)] 96.2 F (35.7 C)  Heart Rate:  [86-115] 86   Resp Rate:  [16-20] 18   BP: (120-164)/(68-90) 124/68 mmHg       No intake or output data in the 24 hours ending 10/26/12 0743 Physical Exam  Gen: resting comfortably, no acute distress  Cardiac: regular rate, regular rhythm  Lungs: CTAB, no apparent respiratory distress  Abdomen: soft, NT/ND  Ext: no edema BLE's       Meds:     Medications were reviewed.    Labs:       Labs (last 72 hours):      Lab 10/26/12 0412 10/25/12 0229   WBC 12.47* 12.91*   HGB 12.3* 14.8   HCT 38.6* 44.3   PLT 334 389       No results found for this basename: PT:2,INR:2,PTT:2 in the last  168 hours   Lab 10/26/12 0412 10/25/12 0229   NA 139 140   K 4.4 4.4   CL 107 102   CO2 24 25   BUN 14.0 13.0   CREAT 1.1 1.5*   CA 9.0 10.2   ALB -- 4.5   PROT -- 8.0   BILITOTAL -- 0.7   ALKPHOS -- 101   ALT -- 19   AST -- 14   GLU 157* 293*                       Case discussed with: Dr. Theda Belfast    Signed by: Aquilla Hacker, MD PGY-2  Pager # [phone]  [facility]   [address]   [place], [place] [address]

## 2012-10-26 NOTE — Discharge Instructions (Signed)
Diet instruction:  -Eat small, frequent low fat meals. Continue to monitor your blood sugars    Medication instructions:  -Take Phenergan 12.5 mg every 6 hours scheduled  -Take Erythromycin 250 mg four times a day  -Continue Protonix and Carafate  -Take Ativan 0.5 mg Ativan with Bendryl if vomiting recurs     Follow-up with GI Dr. Jens Som.         Gastroparesis  Gastroparesis (also called delayed gastric emptying) occurs when the stomach takes longer than normal to empty of food. This is due to a problem with motility (the movement of the muscles in the digestive tract). For many people, gastroparesis is a lifelong condition. But treatment can help relieve symptoms and prevent complications. Read on to learn more about gastroparesis and how it can be managed.  How Gastroparesis Develops     Gastroparesis means that food and fluids move too slowly out of the stomach into the duodenum.   With normal motility, signals from nerves tell the stomach muscles when to contract. These muscles move food from the stomach into the duodenum (the first part of the small bowel). With gastroparesis, the nerves or muscles are damaged. This causes motility to slow down or stop completely. As a result, food cannot move from the stomach properly. This delayed emptying can cause nausea, vomiting, and other symptoms. Malnutrition can result. Bezoars (hardened lumps of food) can form in the stomach and cause other complications as well.  Causes of Gastroparesis  Gastroparesis can be caused by any of the following:   Diabetes   Surgery involving any of the digestive organs, such as the stomach and bowels   Certain medications, such as strong pain medications (narcotics)   Certain conditions, such as systemic scleroderma, Parkinson's disease, and thyroid disease  In many cases, the cause of gastroparesis cannot be found.  Signs and Symptoms of Gastroparesis  These can include:   Nausea and vomiting   Feeling full quickly when  eating   Abdominal pain   Heartburn   Abdominal bloating   Weight loss   Loss of appetite   High and low blood sugar levels (in people withdiabetes)  Diagnosing Gastroparesis  Your doctor will ask about your symptoms and health history. You'll also be examined. In addition, blood tests and X-rays are often done to check your health and rule out other problems. To confirm the problem, you may need other tests as well. These can include:   Upper endoscopy.This is doneto see inside the stomach and duodenum. For the test, an endoscope is used. This is a thin, flexible tube with a tiny camera on the end. It's inserted through the mouth and down into the stomach and duodenum.   Upper gastrointestinal (GI) series.This is doneto take X-rays of the upper GI tract from the mouth to the small bowel. For the test, a substance called barium is used. The barium coats the upper GI tract so that it will show up clearly on X-rays.   Gastric emptying scan.This is done to measure how quickly food leaves the stomach. For the test, a meal containing a harmless radioactive substance (tracer) is eaten. Then scans of the stomach are done. The tracer shows up clearly on the scans and shows the movement of the food through the stomach.  Treating Gastroparesis  The goal of treatment is to help you manage your condition. Treatment may include one or more of the following:   Dietary changes.You may need to make changes to your eating  habits and daily diet. For instance, your doctor may instruct you to eat small meals throughout the day. Doing this can keep you from feeling full too quickly. You may be placed on a liquid or "soft" diet. This means you'll eat liquid foods or foods that are mashed or put through a blender. In addition, you may need to avoid foods high in fats and fiber. These can slow digestion. For more help with your diet, your doctor can refer you to a dietitian. In severe cases, you may need a feeding tube.  This sends liquid food or medication directly to your small bowel, bypassing the stomach.   Medications.These can help manage symptoms, such as nausea and vomiting. They can also improve motility. Each medication has specific risks and side effects. Your doctor can tell you more about any medication that is prescribed for you.   Surgery.You may need to have a tube surgically inserted into the stomach. The tube removes excess air and fluid. This can relieve severe symptoms of nausea and vomiting. In rare cases, other surgery may be needed on the stomach or small bowel. This is to create a new passageway for food to be emptied from the stomach.   Other treatments.These include botulin toxin injection and gastric electrical stimulation. They are done less often and may not be available. Your doctor can tell you more about these treatments if they are options for you.  Diabetes and Gastroparesis  If you have diabetes, gastroparesis can make it harder to manage your blood sugar level. You'll need to take extra steps in your treatment to prevent complications. Work with your doctor to learn what you can do to protect your health. For more information, contact the American Diabetes Association,www.diabetes.org.   Long-term Concerns  With treatment, most people can manage their symptoms and maintain their usual routines. If your symptoms are moderate to severe, you may need to see your doctor more frequently for checkups. Also, other treatments will likely be needed.   [address], [address] [place] , [place] [address]. All rights reserved. This information is not intended as a substitute for professional medical care. Always follow your healthcare professional's instructions.

## 2013-01-08 ENCOUNTER — Inpatient Hospital Stay (HOSPITAL_COMMUNITY)
Admission: EM | Admit: 2013-01-08 | Discharge: 2013-01-09 | DRG: 074 | Discharge status: Against Medical Advice | Payer: BC Managed Care – PPO | Attending: Family Medicine | Admitting: Family Medicine

## 2013-01-08 ENCOUNTER — Encounter (HOSPITAL_COMMUNITY): Payer: Self-pay | Admitting: Emergency Medicine

## 2013-01-08 DIAGNOSIS — K3184 Gastroparesis: Secondary | ICD-10-CM | POA: Diagnosis present

## 2013-01-08 DIAGNOSIS — IMO0001 Reserved for inherently not codable concepts without codable children: Secondary | ICD-10-CM

## 2013-01-08 DIAGNOSIS — E109 Type 1 diabetes mellitus without complications: Secondary | ICD-10-CM

## 2013-01-08 DIAGNOSIS — E1149 Type 2 diabetes mellitus with other diabetic neurological complication: Principal | ICD-10-CM | POA: Diagnosis present

## 2013-01-08 DIAGNOSIS — I10 Essential (primary) hypertension: Secondary | ICD-10-CM | POA: Diagnosis present

## 2013-01-08 DIAGNOSIS — Z794 Long term (current) use of insulin: Secondary | ICD-10-CM

## 2013-01-08 HISTORY — DX: Gastroparesis: K31.84

## 2013-01-08 HISTORY — DX: Gastritis, unspecified, without bleeding: K29.70

## 2013-01-08 HISTORY — DX: Type 2 diabetes mellitus without complications: E11.9

## 2013-01-08 HISTORY — DX: Essential (primary) hypertension: I10

## 2013-01-08 LAB — HEPATIC FUNCTION PANEL
ALT: 26 U/L (ref 0–53)
AST: 28 U/L (ref 0–37)
Albumin: 4.4 g/dL (ref 3.5–5.2)
Alkaline Phosphatase: 92 U/L (ref 39–117)
Indirect Bilirubin: 0.4 mg/dL (ref 0.3–0.9)
Total Bilirubin: 0.5 mg/dL (ref 0.3–1.2)
Total Protein: 7.9 g/dL (ref 6.0–8.3)

## 2013-01-08 LAB — CBC
HCT: 40.1 % (ref 39.0–52.0)
Hemoglobin: 14.1 g/dL (ref 13.0–17.0)
MCH: 27.5 pg (ref 26.0–34.0)
MCHC: 35.2 g/dL (ref 30.0–36.0)
RBC: 5.13 MIL/uL (ref 4.22–5.81)

## 2013-01-08 LAB — BASIC METABOLIC PANEL
BUN: 20 mg/dL (ref 6–23)
CO2: 21 mEq/L (ref 19–32)
Creatinine, Ser: 1.19 mg/dL (ref 0.50–1.35)
GFR calc non Af Amer: 77 mL/min — ABNORMAL LOW (ref >90)
Glucose, Bld: 288 mg/dL — ABNORMAL HIGH (ref 70–99)
Potassium: 4.9 mEq/L (ref 3.5–5.1)
Sodium: 134 mEq/L — ABNORMAL LOW (ref 135–145)

## 2013-01-08 LAB — GLUCOSE, CAPILLARY

## 2013-01-08 LAB — LIPASE, BLOOD: Lipase: 21 U/L (ref 11–59)

## 2013-01-08 MED ORDER — HYDROMORPHONE HCL PF 1 MG/ML IJ SOLN
1.0000 mg | Freq: Once | INTRAMUSCULAR | Status: AC
  Administered 2013-01-08: 1 mg via INTRAVENOUS
  Filled 2013-01-08: qty 1

## 2013-01-08 MED ORDER — PROMETHAZINE HCL 25 MG/ML IJ SOLN: 25.0000 mg | Freq: Once | INTRAMUSCULAR | Status: DC

## 2013-01-08 MED ORDER — PROMETHAZINE HCL 25 MG/ML IJ SOLN
25.0000 mg | Freq: Once | INTRAMUSCULAR | Status: AC
  Administered 2013-01-08: 25 mg via INTRAVENOUS
  Filled 2013-01-08: qty 1

## 2013-01-08 MED ORDER — ONDANSETRON HCL 4 MG/2ML IJ SOLN
4.0000 mg | Freq: Once | INTRAMUSCULAR | Status: AC
  Administered 2013-01-08: 4 mg via INTRAVENOUS
  Filled 2013-01-08: qty 2

## 2013-01-08 MED ORDER — SODIUM CHLORIDE 0.9 % IV SOLN
Freq: Once | INTRAVENOUS | Status: AC
  Administered 2013-01-08: 23:00:00 via INTRAVENOUS

## 2013-01-08 MED ORDER — SODIUM CHLORIDE 0.9 % IV BOLUS (SEPSIS)
1000.0000 mL | Freq: Once | INTRAVENOUS | Status: AC
  Administered 2013-01-08: 1000 mL via INTRAVENOUS

## 2013-01-08 NOTE — ED Provider Notes (Signed)
11:50 PM Accepted care from Dr. Mingo Amber. 45M here w/ vomiting and sx c/w his gastroparesis. Getting sx control. Will re-eval.   4:10 AM Pt continues to have emesis. Will admit for sx control.   Clinical Impression 1. Gastroparesis      Blanchard Kelch, MD 01/09/13 [phone]

## 2013-01-08 NOTE — ED Notes (Signed)
Pt presents to ED with c/o abdominal pain with nausea and vomiting, onset this morning.  Pt reports he's not been able to keep anything down, states he has hx of gastroparesis.

## 2013-01-08 NOTE — ED Provider Notes (Signed)
CSN: [id number]     Arrival date & time 01/08/13  2026 History   First MD Initiated Contact with Patient 01/08/13 2030     Chief Complaint  Patient presents with  . Abdominal Pain   (Consider location/radiation/quality/duration/timing/severity/associated sxs/prior Treatment) Patient is a 37 y.o. male presenting with abdominal pain. The history is provided by the patient.  Abdominal Pain Pain location:  Epigastric Pain quality: aching   Pain radiates to:  Does not radiate Pain severity:  Mild Onset quality:  Gradual Timing:  Constant Progression:  Worsening Chronicity:  Recurrent Context: not alcohol use, not recent illness, not suspicious food intake and not trauma   Relieved by:  Nothing Worsened by:  Nothing tried Ineffective treatments:  None tried Associated symptoms: nausea and vomiting   Associated symptoms: no chest pain, no chills, no cough, no diarrhea, no fever and no shortness of breath     Past Medical History  Diagnosis Date  . Hypertension   . Diabetes mellitus without complication   . Gastritis   . Gastroparesis    Past Surgical History  Procedure Laterality Date  . Cholecystectomy    . Knee surgery Left    History reviewed. No pertinent family history. History  Substance Use Topics  . Smoking status: Never Smoker   . Smokeless tobacco: Never Used  . Alcohol Use: Yes    Review of Systems  Constitutional: Negative for fever and chills.  Respiratory: Negative for cough and shortness of breath.   Cardiovascular: Negative for chest pain.  Gastrointestinal: Positive for nausea, vomiting and abdominal pain. Negative for diarrhea.  All other systems reviewed and are negative.    Allergies  Review of patient's allergies indicates no known allergies.  Home Medications   Current Outpatient Rx  Name  Route  Sig  Dispense  Refill  . acetaminophen (TYLENOL) 500 MG tablet   Oral   Take 500 mg by mouth every 6 (six) hours as needed.         Marland Kitchen  amLODipine (NORVASC) 5 MG tablet   Oral   Take 5 mg by mouth daily.         . cloNIDine (CATAPRES) 0.1 MG tablet   Oral   Take 0.1 mg by mouth 2 (two) times daily.         Marland Kitchen erythromycin (ERY-TAB) 250 MG EC tablet   Oral   Take 250 mg by mouth 3 (three) times daily.         Marland Kitchen gabapentin (NEURONTIN) 300 MG capsule   Oral   Take 600 mg by mouth at bedtime.         Marland Kitchen ibuprofen (ADVIL,MOTRIN) 200 MG tablet   Oral   Take 200 mg by mouth every 6 (six) hours as needed.         . insulin aspart (NOVOLOG) 100 UNIT/ML injection   Subcutaneous   Inject 9-12 Units into the skin 3 (three) times daily before meals.         . insulin glargine (LANTUS) 100 UNIT/ML injection   Subcutaneous   Inject 86 Units into the skin at bedtime.         Marland Kitchen lisinopril (PRINIVIL,ZESTRIL) 20 MG tablet   Oral   Take 20 mg by mouth daily.         . metoCLOPramide (REGLAN) 10 MG tablet   Oral   Take 10 mg by mouth 4 (four) times daily.         . ondansetron (ZOFRAN-ODT) 4 MG  disintegrating tablet   Oral   Take 4 mg by mouth every 8 (eight) hours as needed for nausea or vomiting.         . pantoprazole (PROTONIX) 40 MG tablet   Oral   Take 40 mg by mouth daily.         . simethicone (MYLICON) 0000000 MG chewable tablet   Oral   Chew 125 mg by mouth every 6 (six) hours as needed for flatulence.         . sucralfate (CARAFATE) 1 G tablet   Oral   Take 1 g by mouth 4 (four) times daily -  before meals and at bedtime.          BP 167/94  Pulse 102  Temp(Src) 98.3 F (36.8 C) (Oral)  Resp 20  Ht 6\' 3"  (1.905 m)  Wt 290 lb (131.543 kg)  BMI 36.25 kg/m2  SpO2 97% Physical Exam  Nursing note and vitals reviewed. Constitutional: He is oriented to person, place, and time. He appears well-developed and well-nourished. No distress.  HENT:  Head: Normocephalic and atraumatic.  Mouth/Throat: No oropharyngeal exudate.  Eyes: EOM are normal. Pupils are equal, round, and reactive  to light.  Neck: Normal range of motion. Neck supple.  Cardiovascular: Normal rate and regular rhythm.  Exam reveals no friction rub.   No murmur heard. Pulmonary/Chest: Effort normal and breath sounds normal. No respiratory distress. He has no wheezes. He has no rales.  Abdominal: He exhibits no distension. There is tenderness (mild, epigastric). There is no rebound.  Musculoskeletal: Normal range of motion. He exhibits no edema.  Neurological: He is alert and oriented to person, place, and time.  Skin: He is not diaphoretic.    ED Course  Procedures (including critical care time) Labs Review Labs Reviewed  GLUCOSE, CAPILLARY - Abnormal; Notable for the following:    Glucose-Capillary 239 (*)    All other components within normal limits  CBC  BASIC METABOLIC PANEL   Imaging Review No results found.  EKG Interpretation   None       MDM   1. Gastroparesis   2. HTN (hypertension)   3. IDDM (insulin dependent diabetes mellitus)    55M with hx of gastroparesis, type 2 DM presents with nausea and vomiting. Similar to prior gastroparesis. Takes reglan, sucralfate at home. Multiple episodes of NBNB emesis today. AFVSS here. Mild epigastric pain, no other abdominal pain. Will give zofran and dilaudid, fluids. Patient with some mild improvement, but still vomiting after phenergan. Admitted to medicine by Dr. Dutch Quint, who assumed care.    Osvaldo Shipper, MD 01/10/13 [phone]

## 2013-01-09 DIAGNOSIS — I10 Essential (primary) hypertension: Secondary | ICD-10-CM | POA: Diagnosis present

## 2013-01-09 DIAGNOSIS — K3184 Gastroparesis: Secondary | ICD-10-CM | POA: Diagnosis present

## 2013-01-09 DIAGNOSIS — E109 Type 1 diabetes mellitus without complications: Secondary | ICD-10-CM

## 2013-01-09 DIAGNOSIS — E119 Type 2 diabetes mellitus without complications: Secondary | ICD-10-CM

## 2013-01-09 DIAGNOSIS — Z794 Long term (current) use of insulin: Secondary | ICD-10-CM

## 2013-01-09 LAB — GLUCOSE, CAPILLARY
Glucose-Capillary: 197 mg/dL — ABNORMAL HIGH (ref 70–99)
Glucose-Capillary: 162 mg/dL — ABNORMAL HIGH (ref 70–99)
Glucose-Capillary: 170 mg/dL — ABNORMAL HIGH (ref 70–99)
Glucose-Capillary: 166 mg/dL — ABNORMAL HIGH (ref 70–99)

## 2013-01-09 MED ORDER — HYDROMORPHONE HCL PF 1 MG/ML IJ SOLN
1.0000 mg | Freq: Once | INTRAMUSCULAR | Status: AC
  Administered 2013-01-09: 1 mg via INTRAVENOUS
  Filled 2013-01-09: qty 1

## 2013-01-09 MED ORDER — OXYCODONE-ACETAMINOPHEN 5-325 MG PO TABS: 1.0000 | ORAL_TABLET | ORAL | Status: DC | PRN

## 2013-01-09 MED ORDER — ONDANSETRON 4 MG PO TBDP: ORAL_TABLET | ORAL | Status: DC

## 2013-01-09 MED ORDER — ERYTHROMYCIN BASE 250 MG PO TBEC
250.0000 mg | DELAYED_RELEASE_TABLET | Freq: Three times a day (TID) | ORAL | Status: DC
  Administered 2013-01-09 (×2): 250 mg via ORAL
  Filled 2013-01-09 (×3): qty 1

## 2013-01-09 MED ORDER — KCL IN DEXTROSE-NACL 20-5-0.45 MEQ/L-%-% IV SOLN
INTRAVENOUS | Status: DC
  Administered 2013-01-09: 11:00:00 via INTRAVENOUS
  Filled 2013-01-09 (×3): qty 1000

## 2013-01-09 MED ORDER — LISINOPRIL 20 MG PO TABS
20.0000 mg | ORAL_TABLET | Freq: Every day | ORAL | Status: DC
  Administered 2013-01-09: 20 mg via ORAL
  Filled 2013-01-09: qty 1

## 2013-01-09 MED ORDER — SODIUM CHLORIDE 0.9 % IV SOLN
INTRAVENOUS | Status: DC
  Administered 2013-01-09: 125 mL/h via INTRAVENOUS

## 2013-01-09 MED ORDER — INSULIN ASPART 100 UNIT/ML ~~LOC~~ SOLN
0.0000 [IU] | SUBCUTANEOUS | Status: DC
  Administered 2013-01-09: 4 [IU] via SUBCUTANEOUS
  Administered 2013-01-09: 7 [IU] via SUBCUTANEOUS
  Administered 2013-01-09 (×2): 4 [IU] via SUBCUTANEOUS

## 2013-01-09 MED ORDER — BIOTENE DRY MOUTH MT LIQD
15.0000 mL | Freq: Two times a day (BID) | OROMUCOSAL | Status: DC
  Administered 2013-01-09 (×2): 15 mL via OROMUCOSAL

## 2013-01-09 MED ORDER — CHLORHEXIDINE GLUCONATE 0.12 % MT SOLN
15.0000 mL | Freq: Two times a day (BID) | OROMUCOSAL | Status: DC
  Administered 2013-01-09: 15 mL via OROMUCOSAL
  Filled 2013-01-09 (×3): qty 15

## 2013-01-09 MED ORDER — GABAPENTIN 300 MG PO CAPS
600.0000 mg | ORAL_CAPSULE | Freq: Every day | ORAL | Status: DC
  Filled 2013-01-09: qty 2

## 2013-01-09 MED ORDER — PANTOPRAZOLE SODIUM 40 MG PO TBEC
40.0000 mg | DELAYED_RELEASE_TABLET | Freq: Every day | ORAL | Status: DC
  Administered 2013-01-09: 40 mg via ORAL

## 2013-01-09 MED ORDER — ONDANSETRON 8 MG/NS 50 ML IVPB
8.0000 mg | Freq: Four times a day (QID) | INTRAVENOUS | Status: DC | PRN
  Filled 2013-01-09: qty 8

## 2013-01-09 MED ORDER — METOCLOPRAMIDE HCL 5 MG/5ML PO SOLN
10.0000 mg | Freq: Four times a day (QID) | ORAL | Status: DC
  Administered 2013-01-09 (×3): 10 mg via ORAL
  Filled 2013-01-09 (×5): qty 10

## 2013-01-09 MED ORDER — SUCRALFATE 1 G PO TABS
1.0000 g | ORAL_TABLET | Freq: Three times a day (TID) | ORAL | Status: DC
  Administered 2013-01-09 (×3): 1 g via ORAL
  Filled 2013-01-09 (×5): qty 1

## 2013-01-09 MED ORDER — INSULIN GLARGINE 100 UNIT/ML ~~LOC~~ SOLN
43.0000 [IU] | Freq: Every day | SUBCUTANEOUS | Status: DC
  Filled 2013-01-09: qty 0.43

## 2013-01-09 MED ORDER — AMLODIPINE BESYLATE 5 MG PO TABS
5.0000 mg | ORAL_TABLET | Freq: Every day | ORAL | Status: DC
  Administered 2013-01-09: 5 mg via ORAL
  Filled 2013-01-09: qty 1

## 2013-01-09 MED ORDER — CLONIDINE HCL 0.1 MG PO TABS
0.1000 mg | ORAL_TABLET | Freq: Two times a day (BID) | ORAL | Status: DC
  Administered 2013-01-09: 0.1 mg via ORAL
  Filled 2013-01-09 (×2): qty 1

## 2013-01-09 NOTE — ED Notes (Signed)
Pt was offered ice water--- took small sips.

## 2013-01-09 NOTE — Progress Notes (Signed)
Patient was in-town visiting relative for the holiday. Requesting to leave hospital. Made aware it will be against medical advice and MD will be notified of his desire to leave. Sheran Luz MD returned my page via telephone & relayed if he wants to go to let him go.

## 2013-01-09 NOTE — Care Management Note (Signed)
    Page 1 of 1   01/09/2013     10:01:02 AM   CARE MANAGEMENT NOTE 01/09/2013  Patient:  Duane [name], Duane [name]   Account Number:  [id number]  Date Initiated:  01/09/2013  Documentation initiated by:  Sunday Spillers  Subjective/Objective Assessment:   37 yo male admitted with gastroparesis. PTA lived at home with spouse, is visiting here from [place].     Action/Plan:   Home when stable   Anticipated DC Date:  01/12/2013   Anticipated DC Plan:  [place]  CM consult      Choice offered to / List presented to:             Status of service:  Completed, signed off Medicare Important Message given?   (If response is "NO", the following Medicare IM given date fields will be blank) Date Medicare IM given:   Date Additional Medicare IM given:    Discharge Disposition:  HOME/SELF CARE  Per UR Regulation:  Reviewed for med. necessity/level of care/duration of stay  If discussed at [place] of Stay Meetings, dates discussed:    Comments:

## 2013-01-09 NOTE — Significant Event (Signed)
Patient leaving AMA, states he feels better and wants to go home.  Certainly has decision making capacity at this point.

## 2013-01-09 NOTE — ED Notes (Signed)
Pt became nauseous and had emesis while getting dressed.

## 2013-01-09 NOTE — Progress Notes (Addendum)
Paged MD on call regarding patient request that he would like to go home because he is feeling a lot better, patient has not had anything for pain or nothing for nausea Neta Mends RN 01-09-2013 19:19pm

## 2013-01-09 NOTE — Progress Notes (Signed)
Patient seen and evaluated by my associates earlier this a.m. H&P reviewed and agree with assessment and plan. Please review assessment and plan for further details.   We'll plan on reassessing next a.m.  Duane [name], [employer]

## 2013-01-09 NOTE — Progress Notes (Signed)
UR completed.  Patient changed to inpatient status r/t to requiring IVF @ 100cc/hr and IV antiemetics.

## 2013-01-09 NOTE — Progress Notes (Signed)
Read over AMA form with patient with his written consent given. Voiced he has had gatroparesis several times and he may have eaten something yesterday that did not agree with him. No c/o pain/discomfort. CBG 166 and he relayed he has his own insulin and meds. Will dress self and mother will pick him up. Very pleasant and complimentary of staff and care during this hospitalization.

## 2013-01-09 NOTE — H&P (Signed)
[facility] History and Physical  Errol Kresge [id number] DOB: 1975-06-17 DOA: 01/08/2013  Referring physician: ED PCP: No PCP Per Patient  Chief Complaint: Gastroparesis  HPI: [place] [name] is a 37 y.o. male who is in the area visiting family, he began to develop symptoms typical of his gastroparesis on Wed.  These progressed and today he has severe nausea and is unable to keep PO meds down at this time.  He presented to the ED where pain was controlled with dilaudid, however despite treatment he continued to have N/V.  Patient states his symptoms are very typical for his diabetic gastroparesis which he has frequently and severely.  Review of Systems: 12 systems reviewed and otherwise negative.  Past Medical History  Diagnosis Date  . Hypertension   . Diabetes mellitus without complication   . Gastritis   . Gastroparesis    Past Surgical History  Procedure Laterality Date  . Cholecystectomy    . Knee surgery Left    Social History:  reports that he has never smoked. He has never used smokeless tobacco. He reports that he drinks alcohol. He reports that he does not use illicit drugs.   No Known Allergies  History reviewed. No pertinent family history.  Prior to Admission medications   Medication Sig Start Date End Date Taking? Authorizing Provider  acetaminophen (TYLENOL) 500 MG tablet Take 500 mg by mouth every 6 (six) hours as needed.   Yes Historical Provider, MD  amLODipine (NORVASC) 5 MG tablet Take 5 mg by mouth daily.   Yes Historical Provider, MD  cloNIDine (CATAPRES) 0.1 MG tablet Take 0.1 mg by mouth 2 (two) times daily.   Yes Historical Provider, MD  erythromycin (ERY-TAB) 250 MG EC tablet Take 250 mg by mouth 3 (three) times daily.   Yes Historical Provider, MD  gabapentin (NEURONTIN) 300 MG capsule Take 600 mg by mouth at bedtime.   Yes Historical Provider, MD  ibuprofen (ADVIL,MOTRIN) 200 MG tablet Take 200 mg by mouth every 6 (six) hours as needed.    Yes Historical Provider, MD  insulin aspart (NOVOLOG) 100 UNIT/ML injection Inject 9-12 Units into the skin 3 (three) times daily before meals.   Yes Historical Provider, MD  insulin glargine (LANTUS) 100 UNIT/ML injection Inject 86 Units into the skin at bedtime.   Yes Historical Provider, MD  lisinopril (PRINIVIL,ZESTRIL) 20 MG tablet Take 20 mg by mouth daily.   Yes Historical Provider, MD  metoCLOPramide (REGLAN) 10 MG tablet Take 10 mg by mouth 4 (four) times daily.   Yes Historical Provider, MD  ondansetron (ZOFRAN-ODT) 4 MG disintegrating tablet Take 4 mg by mouth every 8 (eight) hours as needed for nausea or vomiting.   Yes Historical Provider, MD  pantoprazole (PROTONIX) 40 MG tablet Take 40 mg by mouth daily.   Yes Historical Provider, MD  simethicone (MYLICON) 0000000 MG chewable tablet Chew 125 mg by mouth every 6 (six) hours as needed for flatulence.   Yes Historical Provider, MD  sucralfate (CARAFATE) 1 G tablet Take 1 g by mouth 4 (four) times daily -  before meals and at bedtime.   Yes Historical Provider, MD  ondansetron (ZOFRAN ODT) 4 MG disintegrating tablet 4mg  ODT q4 hours prn nausea/vomit 01/09/13   Blanchard Kelch, MD  oxyCODONE-acetaminophen (PERCOCET) 5-325 MG per tablet Take 1 tablet by mouth every 4 (four) hours as needed. 01/09/13   Blanchard Kelch, MD   Physical Exam: Filed Vitals:   01/09/13 0451  BP: 143/93  Pulse:  101  Temp: 98.1 F (36.7 C)  Resp: 18     General:  NAD, resting comfortably in bed  Eyes: PEERLA EOMI  ENT: mucous membranes moist  Neck: supple w/o JVD  Cardiovascular: RRR w/o MRG  Respiratory: CTA B  Abdomen: soft, nt, nd, bs+  Skin: no rash nor lesion  Musculoskeletal: MAE, full ROM all 4 extremities  Psychiatric: normal tone and affect  Neurologic: AAOx3, grossly non-focal   Labs on Admission:  Basic Metabolic Panel:  Recent Labs Lab 01/08/13 2055  NA 134*  K 4.9  CL 96  CO2 21  GLUCOSE 288*  BUN 20   CREATININE 1.19  CALCIUM 9.6   Liver Function Tests:  Recent Labs Lab 01/08/13 2055  AST 28  ALT 26  ALKPHOS 92  BILITOT 0.5  PROT 7.9  ALBUMIN 4.4    Recent Labs Lab 01/08/13 2055  LIPASE 21   No results found for this basename: AMMONIA,  in the last 168 hours CBC:  Recent Labs Lab 01/08/13 2055  WBC 12.1*  HGB 14.1  HCT 40.1  MCV 78.2  PLT 267   Cardiac Enzymes: No results found for this basename: CKTOTAL, CKMB, CKMBINDEX, TROPONINI,  in the last 168 hours  BNP (last 3 results) No results found for this basename: PROBNP,  in the last 8760 hours CBG:  Recent Labs Lab 01/08/13 2041  GLUCAP 239*    Radiological Exams on Admission: No results found.  EKG: Independently reviewed.  Assessment/Plan Principal Problem:   Gastroparesis Active Problems:   IDDM (insulin dependent diabetes mellitus)   HTN (hypertension)   1. Gastroparesis - Patient with long history of gastroparesis, although we have no records here, the patient states he has a long history of this with frequent admissions up in [place], and his home med rec is strongly consistent with treatment for severe gastroparesis (chronic max dose reglan and erythromycin too).  Will admit patient, put him on IV reglan 10mg  QID scheduled, and IV zofran prn, NPO except meds for now. 2. IDDM - since patient is NPO will give him half his home does of lantus (takes 86 daily at home, will give 43 daily here), and will put him on high dose SSI q4h. 3. HTN - continue home meds, add a PRN IV med if he is unable to keep his meds down and his BP goes above 99991111 systolic.    Code Status: Full (must indicate code status--if unknown or must be presumed, indicate so) Family Communication: No family in room (indicate person spoken with, if applicable, with phone number if by telephone) Disposition Plan: Admit to obs (indicate anticipated LOS)  Time spent: 70 min  GARDNER, JARED M. [facility] Pager  [phone]  If 7PM-7AM, please contact night-coverage [email or web address] Password [facility] 01/09/2013, 4:56 AM

## 2013-01-11 ENCOUNTER — Inpatient Hospital Stay: Payer: BC Managed Care – PPO | Admitting: Family Medicine

## 2013-01-11 ENCOUNTER — Inpatient Hospital Stay
Admission: EM | Admit: 2013-01-11 | Discharge: 2013-01-19 | DRG: 074 | Disposition: A | Discharge status: Home / Self Care | Payer: BC Managed Care – PPO | Attending: Family Medicine | Admitting: Family Medicine

## 2013-01-11 ENCOUNTER — Emergency Department: Payer: BC Managed Care – PPO

## 2013-01-11 DIAGNOSIS — K5900 Constipation, unspecified: Secondary | ICD-10-CM | POA: Diagnosis present

## 2013-01-11 DIAGNOSIS — E1149 Type 2 diabetes mellitus with other diabetic neurological complication: Principal | ICD-10-CM | POA: Diagnosis present

## 2013-01-11 DIAGNOSIS — Z794 Long term (current) use of insulin: Secondary | ICD-10-CM

## 2013-01-11 DIAGNOSIS — E860 Dehydration: Secondary | ICD-10-CM | POA: Diagnosis present

## 2013-01-11 DIAGNOSIS — R066 Hiccough: Secondary | ICD-10-CM | POA: Diagnosis not present

## 2013-01-11 DIAGNOSIS — E872 Acidosis, unspecified: Secondary | ICD-10-CM | POA: Diagnosis present

## 2013-01-11 DIAGNOSIS — K219 Gastro-esophageal reflux disease without esophagitis: Secondary | ICD-10-CM | POA: Diagnosis present

## 2013-01-11 DIAGNOSIS — R112 Nausea with vomiting, unspecified: Secondary | ICD-10-CM | POA: Diagnosis present

## 2013-01-11 DIAGNOSIS — E1142 Type 2 diabetes mellitus with diabetic polyneuropathy: Secondary | ICD-10-CM | POA: Diagnosis present

## 2013-01-11 DIAGNOSIS — K3184 Gastroparesis: Secondary | ICD-10-CM | POA: Diagnosis present

## 2013-01-11 DIAGNOSIS — Z833 Family history of diabetes mellitus: Secondary | ICD-10-CM

## 2013-01-11 DIAGNOSIS — Z79899 Other long term (current) drug therapy: Secondary | ICD-10-CM

## 2013-01-11 DIAGNOSIS — I10 Essential (primary) hypertension: Secondary | ICD-10-CM | POA: Diagnosis present

## 2013-01-11 DIAGNOSIS — N179 Acute kidney failure, unspecified: Secondary | ICD-10-CM | POA: Diagnosis present

## 2013-01-11 LAB — ECG 12-LEAD
Atrial Rate: 90 {beats}/min
Atrial Rate: 91 {beats}/min
P Axis: 38 degrees
P Axis: 40 degrees
P-R Interval: 166 ms
P-R Interval: 172 ms
Q-T Interval: 362 ms
Q-T Interval: 364 ms
QRS Duration: 90 ms
QRS Duration: 92 ms
QTC Calculation (Bezet): 442 ms
QTC Calculation (Bezet): 447 ms
R Axis: 46 degrees
R Axis: 47 degrees
T Axis: 22 degrees
T Axis: 23 degrees
Ventricular Rate: 90 {beats}/min
Ventricular Rate: 91 {beats}/min

## 2013-01-11 LAB — COMPREHENSIVE METABOLIC PANEL
ALT: 27 U/L (ref 0–55)
AST (SGOT): 17 U/L (ref 5–34)
Albumin/Globulin Ratio: 1.3 (ref 0.9–2.2)
Albumin: 4.3 g/dL (ref 3.5–5.0)
Alkaline Phosphatase: 114 U/L (ref 40–150)
BUN: 15 mg/dL (ref 9.0–21.0)
Bilirubin, Total: 0.6 mg/dL (ref 0.2–1.2)
CO2: 24 mEq/L (ref 22–29)
Calcium: 9.5 mg/dL (ref 8.5–10.5)
Chloride: 103 mEq/L (ref 98–107)
Creatinine: 1.4 mg/dL — ABNORMAL HIGH (ref 0.7–1.3)
Globulin: 3.4 g/dL (ref 2.0–3.6)
Glucose: 271 mg/dL — ABNORMAL HIGH (ref 70–100)
Potassium: 4.7 mEq/L (ref 3.5–5.1)
Protein, Total: 7.7 g/dL (ref 6.0–8.3)
Sodium: 138 mEq/L (ref 136–145)

## 2013-01-11 LAB — GFR: EGFR: >60

## 2013-01-11 LAB — LIPASE: Lipase: 63 U/L (ref 8–78)

## 2013-01-11 MED ORDER — ONDANSETRON HCL 4 MG/2ML IJ SOLN
4.0000 mg | Freq: Once | INTRAMUSCULAR | Status: AC
Start: 2013-01-11 — End: 2013-01-11
  Administered 2013-01-11: 4 mg via INTRAVENOUS
  Filled 2013-01-11: qty 2

## 2013-01-11 MED ORDER — HYDROMORPHONE HCL PF 1 MG/ML IJ SOLN
1.0000 mg | Freq: Once | INTRAMUSCULAR | Status: AC
Start: 2013-01-11 — End: 2013-01-11
  Administered 2013-01-11: 1 mg via INTRAVENOUS
  Filled 2013-01-11: qty 1

## 2013-01-11 MED ORDER — SODIUM CHLORIDE 0.9 % IV BOLUS
2000.0000 mL | Freq: Once | INTRAVENOUS | Status: AC
Start: 2013-01-11 — End: 2013-01-12
  Administered 2013-01-11: 2000 mL via INTRAVENOUS

## 2013-01-11 MED ORDER — PROMETHAZINE HCL 25 MG/ML IJ SOLN
12.5000 mg | Freq: Once | INTRAMUSCULAR | Status: AC
Start: 2013-01-11 — End: 2013-01-11
  Administered 2013-01-11: 12.5 mg via INTRAVENOUS
  Filled 2013-01-11: qty 1

## 2013-01-11 NOTE — ED Provider Notes (Signed)
Physician/Midlevel provider first contact with patient: 01/11/13 2250         History     Chief Complaint   Patient presents with   . Emesis     Patient is a 37 y.o. male presenting with vomiting. The history is provided by the patient. No language interpreter was used.   Emesis   This is a chronic problem. The current episode started 6 to 12 hours ago. The problem occurs continuously. The problem has been gradually worsening. The emesis has an appearance of bilious material. There has been no fever. Associated symptoms include abdominal pain and chills.   37 y/o m k/c DM, HTN, gastroparesis with multiple admissions due to gastroparesis p/w worsening vomiting food content and bilious, he tried zofran which didn't help. Associated with chills and  abdominal pain epigastric, sharp pain which makes him more nauseous. Denies fever, diarrhea, sob, chest pain or recent travel.  Past Medical History   Diagnosis Date   . Diabetes    . Hypertensive disorder    . Gastroparesis        Past Surgical History   Procedure Date   . Cholecystectomy    . Knee arthroscopy w/ acl reconstruction      left   . Egd 08/08/2012     Procedure: EGD;  Surgeon: [name], MD;  Location[facility];  Service: Gastroenterology;  Laterality: N/A;  w\bx       Family History   Problem Relation Age of Onset   . Diabetes Father    . Diabetes Maternal Grandmother    . Diabetes Paternal Grandmother        Social  History   Substance Use Topics   . Smoking status: Never Smoker    . Smokeless tobacco: Never Used   . Alcohol Use: No       .     No Known Allergies    Current/Home Medications    AMLODIPINE (NORVASC) 5 MG TABLET    Take 2 tablets (10 mg total) by mouth daily.    CLONIDINE (CATAPRES) 0.1 MG TABLET    Take 1 tablet (0.1 mg total) by mouth every 12 (twelve) hours.    INSULIN GLARGINE (LANTUS) 100 UNIT/ML INJECTION    Inject 20 Units into the skin nightly.    LORAZEPAM (ATIVAN) 0.5 MG TABLET    Take 1 tablet (0.5 mg total) by  mouth every 6 (six) hours as needed. PRN cyclic vomiting    PANTOPRAZOLE (PROTONIX) 40 MG TABLET    Take 1 tablet (40 mg total) by mouth 2 (two) times daily.    PROMETHAZINE (PHENERGAN) 12.5 MG TABLET    Take 1 tablet (12.5 mg total) by mouth every 6 (six) hours.    SUCRALFATE (CARAFATE) 1 G TABLET    Take 1 tablet (1 g total) by mouth 4 (four) times daily.    TRAMADOL (ULTRAM) 50 MG TABLET    Take 2 tablets (100 mg total) by mouth every 6 (six) hours as needed.        Review of Systems   Constitutional: Positive for chills.   Gastrointestinal: Positive for vomiting and abdominal pain.   All other systems reviewed and are negative.        Physical Exam    BP 169/98  Pulse 88  Temp 97.1 F (36.2 C)  Resp 18  SpO2 97%    Physical Exam   Constitutional: He is oriented to person, place, and time. He appears well-developed  and well-nourished.   HENT:   Head: Normocephalic and atraumatic.   Eyes: Conjunctivae normal and EOM are normal.   Neck: Normal range of motion. Neck supple.   Cardiovascular: Normal rate, regular rhythm and normal heart sounds.    Pulmonary/Chest: Effort normal and breath sounds normal.   Abdominal: Soft. Bowel sounds are normal. He exhibits distension. There is tenderness in the epigastric area.   Neurological: He is alert and oriented to person, place, and time.   Skin: Skin is warm and dry.       MDM and ED Course     ED Medication Orders      Start     Status Ordering Provider    01/12/13 0056   HYDROmorphone (DILAUDID) injection 1 mg   Once      Route: Intravenous  Ordered Dose: 1 mg         Last MAR action:  Given [facility], Gaudencio Chesnut F    01/12/13 0056   ondansetron (ZOFRAN) injection 4 mg   Once      Route: Intravenous  Ordered Dose: 4 mg         Last MAR action:  Given Ivonne Freeburg F    01/11/13 2315   ondansetron (ZOFRAN) injection 4 mg   Once      Route: Intravenous  Ordered Dose: 4 mg         Last MAR action:  Given LUETTGEN, GARRETT L    01/11/13 2306   HYDROmorphone (DILAUDID)  injection 1 mg   Once      Route: Intravenous  Ordered Dose: 1 mg         Last MAR action:  Given LUETTGEN, GARRETT L    01/11/13 2306   promethazine (PHENERGAN) injection 12.5 mg   Once      Route: Intravenous  Ordered Dose: 12.5 mg         Last MAR action:  Given LUETTGEN, GARRETT L    01/11/13 2306   sodium chloride 0.9 % bolus 2,000 mL   Once      Route: Intravenous  Ordered Dose: 2,000 mL         Last MAR action:  New Bag LUETTGEN, GARRETT L                 MDM  Number of Diagnoses or Management Options     Amount and/or Complexity of Data Reviewed  Clinical lab tests: ordered  Tests in the radiology section of CPT: ordered  Tests in the medicine section of CPT: ordered      0030: pt feels better after dilaudid and zofran but still symptomatic, will re-evaluate and decide for disposition.  0235: pt still feels pain but not nauseated, will admit for symptomatic treatment. I spoke with [facility] FP resident and will be admitted in observational unit.    Procedures    Clinical Impression & Disposition     Clinical Impression  Final diagnoses:   Gastroparesis        ED Disposition     Admit Admitting Physician: Jillene Bucks [[id number]]  Diagnosis: Gastroparesis [536.3]  Estimated Length of Stay: > or = to 2 midnights  Tentative Discharge Plan?: Home or Self Care [1]  Patient Class: Inpatient [101]  I certify that inpatient services are medically necessary for this patient. Please see H&P and MD progress notes for additional information about the patient's course of treatment. For Medicare patients, services provided in accordance with 412.3 and expected LOS to  be greater than 2 midnights for Medicare patients.: Yes             New Prescriptions    No medications on file        Treatment Team: Scribe: [place], Chestine Spore, MD  Resident  01/11/13 2313    Kingsley Callander, MD  Resident  01/12/13 1610    Kingsley Callander, MD  Resident  01/12/13 [phone]

## 2013-01-11 NOTE — ED Provider Notes (Signed)
Physician/Midlevel provider first contact with patient: 01/11/13 2250           Attending Note     The patient was seen and examined by the resident, and I agree with the assessment and plan of care.  I evaluated the patient at 11:38 PM and my exam reveals:    Diagnosis and Treatment Plan     Presumptive Diagnosis: gastroparesis, intractable vomiting    Treatment Plan: labs, IVF, pain control, admission    Pertinent Historical Features     Chief Complaint   Patient presents with   . Emesis       [name] is a 37 y.o. male with PMHx of diabetes, HTN, gastroparesis who presents with worsening nausea and vomiting a/w epigastric abd pain onset this afternoon PTA. Patient sts he has been vomiting food and has been bilious. Patient sts he had Zofran w/ no relief. Denies fever, diarrhea, SOB, CP and recent travel.  Admitted to the hospital in [place], discharged yesterday, recurrence of symptoms starting this morning.  Reportedly in discussion to visit a clinic for domperidone treatment, has not occurred at this point.      PCP: [name], MD     has a past medical history of Diabetes; Hypertensive disorder; and Gastroparesis.   has past surgical history that includes Cholecystectomy; Knee arthroscopy w/ ACL reconstruction; and EGD (08/08/2012).  Family History   Problem Relation Age of Onset   . Diabetes Father    . Diabetes Maternal Grandmother    . Diabetes Paternal Grandmother      Social History: Lives at home with family  (I reviewed the vital signs, nursing notes, past medical history, past surgical history, family history and social history).    Current facility-administered medications:0.9%  NaCl infusion, , Intravenous, Continuous, Coulter, Adelene Amas, MD, Last Rate: 100 mL/hr at 01/12/13 0413, 100 mL/hr at 01/12/13 0413;  cloNIDine (CATAPRES-TTS-1) 0.1 MG/24HR 1 patch, 1 patch, Transdermal, Weekly, Coulter, Adelene Amas, MD;  dextrose (GLUCOSE) 40 % oral gel 15 g, 15 g, Oral, PRN, Coulter, Adelene Amas, MD;  dextrose  50 % bolus 25 mL, 25 mL, Intravenous, PRN, Coulter, Adelene Amas, MD  enoxaparin (LOVENOX) syringe 40 mg, 40 mg, Subcutaneous, Daily, Coulter, Adelene Amas, MD;  erythromycin (ERYTHROCIN) 100 mg in sodium chloride 0.9 % 250 mL IVPB, 100 mg, Intravenous, Q8H, Coulter, Adelene Amas, MD;  glucagon (rDNA) (GLUCAGEN) injection 1 mg, 1 mg, Intramuscular, PRN, Coulter, Adelene Amas, MD;  hydrALAZINE (APRESOLINE) injection 10 mg, 10 mg, Intravenous, Q6H PRN, Coulter, Adelene Amas, MD, 10 mg at 01/12/13 0414  [COMPLETED] HYDROmorphone (DILAUDID) injection 1 mg, 1 mg, Intravenous, Once, Artis Flock, MD, 1 mg at 01/11/13 2309;  [COMPLETED] HYDROmorphone (DILAUDID) injection 1 mg, 1 mg, Intravenous, Once, Kadamany, Mohammed F, MD, 1 mg at 01/12/13 0103;  insulin aspart (NovoLOG) injection 1-8 Units, 1-8 Units, Subcutaneous, PRN, Coulter, Adelene Amas, MD  metoclopramide (REGLAN) injection 10 mg, 10 mg, Intravenous, Q6H SCH, Coulter, Adelene Amas, MD, 10 mg at 01/12/13 4098;  naloxone [facility]) injection 0.2 mg, 0.2 mg, Intravenous, PRN, Coulter, Adelene Amas, MD;  [COMPLETED] ondansetron [facility]) injection 4 mg, 4 mg, Intravenous, Once, Artis Flock, MD, 4 mg at 01/11/13 2316;  [COMPLETED] ondansetron (ZOFRAN) injection 4 mg, 4 mg, Intravenous, Once, Kadamany, Mohammed F, MD, 4 mg at 01/12/13 0103  pantoprazole (PROTONIX) injection 40 mg, 40 mg, Intravenous, Daily, Coulter, Adelene Amas, MD;  [COMPLETED] promethazine (PHENERGAN) injection 12.5 mg, 12.5 mg, Intravenous, Once, Hal Hope  L, MD, 12.5 mg at 01/11/13 2309;  promethazine (PHENERGAN) injection 12.5 mg, 12.5 mg, Intravenous, Q6H, Coulter, Adelene Amas, MD;  [COMPLETED] sodium chloride 0.9 % bolus 2,000 mL, 2,000 mL, Intravenous, Once, Rebekah Chesterfield, Sharion Settler, MD, 2,000 mL at 01/11/13 2308  [DISCONTINUED] heparin (porcine) injection 5,000 Units, 5,000 Units, Subcutaneous, Q8H SCH, Coulter, Adelene Amas, MD    Review of Systems     Positive: vomiting, nausea, epigastric abd pain.   All Other Systems Reviewed  and Negative: Yes    Pertinent Physical Examination Features     BP 152/85  Pulse 90  Temp 97.8 F (36.6 C) (Axillary)  Resp 20  Ht 1.93 m  Wt 136.805 kg  BMI 36.73 kg/m2  SpO2 96%  Vitals reviewed   GEN: . Nontoxic, uncomfortable appearing, vomiting on exam, moderate distress.     Head: NC/AT.     Eyes: EOMI w/o pain, PERRL, nl conjunctiva. NO d/c.    ENT: MMM, symmetric OP, no drooling/trismus.    Neck: FROM, supple, no masses, no tenderness, no cervical lymphadenopathy.  Chest: ctab, NO resp distress, no crackles or wheezing. Equal chest rise.   CV: rrr, NO significant murmur appreciated.     Abd: no peritoneal signs, soft, no distention, moderate epigastric tenderness      Back: NO CVAt, NO midline ttp.     UpperExt: NO deformity. FROM, neurovasc intact.       LowerExt: NO edema. FROM, neurovasc intact.        Neuro: CN II-XII intact, moving all ext equally, no motor deficits, normal cerebellar function, normal gait.     Skin: Warm and dry. NO rash.        Psych: Normal affect. Normal insight.        Clinical Course in the Emergency Department     Persistently vomiting throughout time in the ED, symptoms improved but not tolerating PO well during time in the ED, plan to admit for intractable vomiting.      Medical Decision Making   Recurrent gastroparesis, exacerbation after the Thanksgiving Weekend, hospitalized in [place], discharged yesterday and experienced recurrent symptoms today.  Vomited through multiple rounds of pain medication and nausea meds, will plan to admit for bowel rest, continued IV fluids and anti emetics.  Patient and spouse agreeable with plan.  Non toxic appearing otherwise.      EKG: none    Patient Vitals for the past 12 hrs:   BP Temp Pulse Resp   01/12/13 0402 - 97.8 F (36.6 C) 90  20    01/12/13 0259 152/85 mmHg - 82  -   01/12/13 0159 162/94 mmHg - 98  -   01/12/13 0107 169/98 mmHg - 88  18    01/12/13 0104 169/98 mmHg - - -   01/11/13 2202 199/96 mmHg 97.1 F (36.2 C) 125  22       Pulse ox interpretation: Normal  Laboratory results reviewed by EDP: Yes  Radiologic study results reviewed by EDP: N/A  Radiologic Studies Interpreted (viewed) by EDP: N/A    Labs -     Results     Procedure Component Value Units Date/Time    Comprehensive Metabolic Panel (CMP) [[id number]]  (Abnormal) Collected:01/11/13 2313    Specimen Information:Blood Updated:01/11/13 2341     Glucose 271 (H) mg/dL      BUN 40.9 mg/dL      Creatinine 1.4 (H) mg/dL      Sodium 811 mEq/L      Potassium 4.7  mEq/L      Chloride 103 mEq/L      CO2 24 mEq/L      CALCIUM 9.5 mg/dL      Protein, Total 7.7 g/dL      Albumin 4.3 g/dL      AST (SGOT) 17 U/L      ALT 27 U/L      Alkaline Phosphatase 114 U/L      Bilirubin, Total 0.6 mg/dL      Globulin 3.4 g/dL      Albumin/Globulin Ratio 1.3     LAB-Lipase [[id number]] Collected:01/11/13 2313    Specimen Information:Blood Updated:01/11/13 2341     Lipase 63 U/L     GFR [[id number]] Collected:01/11/13 2313     EGFR >60.0 Updated:01/11/13 2341          Radiologic Studies -   Radiology Results (24 Hour)     Procedure Component Value Units Date/Time    Chest AP Portable [[id number]] Collected:01/12/13 0002    Order Status:Completed  Updated:01/12/13 0007    Narrative:    TECHNIQUE:  AP chest    INDICATION: Vomiting. Abdominal pain. Diabetes. Hypertension.    COMPARISON: 08/05/2012    FINDINGS:     LINES/TUBES: None.    LUNGS: No consolidation or edema.    PLEURA: No pleural effusions or pneumothorax.    HEART AND MEDIASTINUM:  No cardiac enlargement.    BONES:  Suboptimally evaluated.      Impression:      No acute abnormality.    Johnsie Kindred, MD   01/12/2013 12:03 AM      .    Disposition:  ED Disposition     Observation Admitting Physician: Jillene Bucks [phone]  Diagnosis: Gastroparesis [536.3]  Estimated Length of Stay: < 2 midnights  Tentative Discharge Plan?: Home or Self Care [1]  Patient Class: Observation [104]            Diagnosis:    1. Gastroparesis          Hal Hope,  MD    Attestations:  I was acting as a scribe for Artis Flock, MD on [name]  Treatment Team: Scribe: Sonda Rumble     I am the first provider for this patient and I personally performed the services documented. Treatment Team: Scribe: Sonda Rumble is scribing for me on Omlor,Gildardo L. This note accurately reflects work and decisions made by me.  Artis Flock, MD            Artis Flock, MD  01/12/13 [phone]

## 2013-01-11 NOTE — ED Notes (Signed)
Pt c/o n/v starting this afternoon. H/o dm, gastroparesis.

## 2013-01-12 LAB — CBC AND DIFFERENTIAL
Basophils Absolute Automated: 0.01 (ref 0.00–0.20)
Basophils Automated: 0 %
Eosinophils Absolute Automated: 0.01 (ref 0.00–0.70)
Eosinophils Automated: 0 %
Hematocrit: 37.7 % — ABNORMAL LOW (ref 42.0–52.0)
Hgb: 12.6 g/dL — ABNORMAL LOW (ref 13.0–17.0)
Immature Granulocytes Absolute: 0.03
Immature Granulocytes: 0 %
Lymphocytes Absolute Automated: 2.52 (ref 0.50–4.40)
Lymphocytes Automated: 27 %
MCH: 26.8 pg — ABNORMAL LOW (ref 28.0–32.0)
MCHC: 33.4 g/dL (ref 32.0–36.0)
MCV: 80 fL (ref 80.0–100.0)
MPV: 9.7 fL (ref 9.4–12.3)
Monocytes Absolute Automated: 0.34 (ref 0.00–1.20)
Monocytes: 4 %
Neutrophils Absolute: 6.32 (ref 1.80–8.10)
Neutrophils: 68 %
Nucleated RBC: 0 (ref 0–1)
Platelets: 262 (ref 140–400)
RBC: 4.71 (ref 4.70–6.00)
RDW: 12 % (ref 12–15)
WBC: 9.23 (ref 3.50–10.80)

## 2013-01-12 LAB — GFR: EGFR: >60

## 2013-01-12 LAB — BASIC METABOLIC PANEL
BUN: 13 mg/dL (ref 9.0–21.0)
CO2: 20 mEq/L — ABNORMAL LOW (ref 22–29)
Calcium: 9.2 mg/dL (ref 8.5–10.5)
Chloride: 106 mEq/L (ref 98–107)
Creatinine: 1.3 mg/dL (ref 0.7–1.3)
Glucose: 260 mg/dL — ABNORMAL HIGH (ref 70–100)
Potassium: 4.4 mEq/L (ref 3.5–5.1)
Sodium: 139 mEq/L (ref 136–145)

## 2013-01-12 LAB — ECG 12-LEAD
Atrial Rate: 122 {beats}/min
P Axis: 58 degrees
P-R Interval: 140 ms
Q-T Interval: 336 ms
QRS Duration: 88 ms
QTC Calculation (Bezet): 478 ms
R Axis: 45 degrees
T Axis: -4 degrees
Ventricular Rate: 122 {beats}/min

## 2013-01-12 LAB — HEMOGLOBIN A1C: Hemoglobin A1C: 9.1 % — ABNORMAL HIGH (ref 0.0–6.0)

## 2013-01-12 LAB — GLUCOSE WHOLE BLOOD - POCT
Whole Blood Glucose POCT: 201 mg/dL — ABNORMAL HIGH (ref 70–100)
Whole Blood Glucose POCT: 205 mg/dL — ABNORMAL HIGH (ref 70–100)

## 2013-01-12 MED ORDER — ONDANSETRON HCL 4 MG/2ML IJ SOLN
4.0000 mg | Freq: Once | INTRAMUSCULAR | Status: AC
Start: 2013-01-12 — End: 2013-01-12
  Administered 2013-01-12: 4 mg via INTRAVENOUS
  Filled 2013-01-12: qty 2

## 2013-01-12 MED ORDER — NALOXONE HCL 0.4 MG/ML IJ SOLN
0.2000 mg | INTRAMUSCULAR | Status: DC | PRN
Start: 2013-01-12 — End: 2013-01-19

## 2013-01-12 MED ORDER — SODIUM CHLORIDE 0.9 % IV SOLN
INTRAVENOUS | Status: DC
Start: 2013-01-12 — End: 2013-01-19
  Administered 2013-01-12: 100 mL/h via INTRAVENOUS

## 2013-01-12 MED ORDER — INSULIN ASPART 100 UNIT/ML SC SOLN
1.0000 [IU] | SUBCUTANEOUS | Status: DC | PRN
Start: 2013-01-12 — End: 2013-01-14
  Administered 2013-01-12 – 2013-01-14 (×4): 3 [IU] via SUBCUTANEOUS
  Administered 2013-01-14: 5 [IU] via SUBCUTANEOUS
  Filled 2013-01-12: qty 10
  Filled 2013-01-12: qty 30
  Filled 2013-01-12: qty 10

## 2013-01-12 MED ORDER — ENOXAPARIN SODIUM 40 MG/0.4ML SC SOLN
40.0000 mg | Freq: Every day | SUBCUTANEOUS | Status: DC
Start: 2013-01-12 — End: 2013-01-19
  Administered 2013-01-12 – 2013-01-18 (×7): 40 mg via SUBCUTANEOUS
  Filled 2013-01-12 (×8): qty 0.4

## 2013-01-12 MED ORDER — CLONIDINE HCL 0.1 MG/24HR TD PTWK
1.0000 | MEDICATED_PATCH | TRANSDERMAL | Status: AC
Start: 2013-01-12 — End: 2013-01-19
  Administered 2013-01-12: 1 via TRANSDERMAL
  Filled 2013-01-12 (×2): qty 1

## 2013-01-12 MED ORDER — TRAMADOL HCL 50 MG PO TABS
50.0000 mg | ORAL_TABLET | Freq: Once | ORAL | Status: AC
Start: 2013-01-13 — End: 2013-01-13
  Administered 2013-01-13: 50 mg via ORAL
  Filled 2013-01-12: qty 1

## 2013-01-12 MED ORDER — DEXTROSE 50 % IV SOLN
25.0000 mL | INTRAVENOUS | Status: DC | PRN
Start: 2013-01-12 — End: 2013-01-19

## 2013-01-12 MED ORDER — HYDROMORPHONE HCL PF 1 MG/ML IJ SOLN
1.0000 mg | Freq: Once | INTRAMUSCULAR | Status: AC
Start: 2013-01-12 — End: 2013-01-12
  Administered 2013-01-12: 1 mg via INTRAVENOUS
  Filled 2013-01-12: qty 1

## 2013-01-12 MED ORDER — HEPARIN SODIUM (PORCINE) 5000 UNIT/ML IJ SOLN
5000.0000 [IU] | Freq: Three times a day (TID) | INTRAMUSCULAR | Status: DC
Start: 2013-01-12 — End: 2013-01-12

## 2013-01-12 MED ORDER — METOCLOPRAMIDE HCL 5 MG/ML IJ SOLN
10.0000 mg | Freq: Three times a day (TID) | INTRAMUSCULAR | Status: DC | PRN
Start: 2013-01-12 — End: 2013-01-13
  Administered 2013-01-12: 10 mg via INTRAVENOUS
  Filled 2013-01-12: qty 2

## 2013-01-12 MED ORDER — HYDROMORPHONE HCL PF 1 MG/ML IJ SOLN
0.4000 mg | Freq: Once | INTRAMUSCULAR | Status: AC
Start: 2013-01-12 — End: 2013-01-12
  Administered 2013-01-12: 0.4 mg via INTRAVENOUS
  Filled 2013-01-12: qty 1

## 2013-01-12 MED ORDER — METOCLOPRAMIDE HCL 5 MG/ML IJ SOLN
10.0000 mg | Freq: Four times a day (QID) | INTRAMUSCULAR | Status: DC
Start: 2013-01-12 — End: 2013-01-14
  Administered 2013-01-12 – 2013-01-14 (×8): 10 mg via INTRAVENOUS
  Filled 2013-01-12 (×8): qty 2

## 2013-01-12 MED ORDER — HYDRALAZINE HCL 20 MG/ML IJ SOLN
10.0000 mg | Freq: Four times a day (QID) | INTRAMUSCULAR | Status: DC | PRN
Start: 2013-01-12 — End: 2013-01-15
  Administered 2013-01-12 – 2013-01-15 (×3): 10 mg via INTRAVENOUS
  Filled 2013-01-12 (×3): qty 1

## 2013-01-12 MED ORDER — PROMETHAZINE HCL 25 MG/ML IJ SOLN
12.5000 mg | Freq: Four times a day (QID) | INTRAMUSCULAR | Status: DC
Start: 2013-01-12 — End: 2013-01-13
  Administered 2013-01-12 – 2013-01-13 (×5): 12.5 mg via INTRAVENOUS
  Filled 2013-01-12 (×6): qty 1

## 2013-01-12 MED ORDER — SODIUM CHLORIDE 0.9 % IV SOLN
100.0000 mg | Freq: Three times a day (TID) | INTRAVENOUS | Status: DC
Start: 2013-01-12 — End: 2013-01-19
  Administered 2013-01-12 – 2013-01-19 (×23): 100 mg via INTRAVENOUS
  Filled 2013-01-12 (×29): qty 100

## 2013-01-12 MED ORDER — PANTOPRAZOLE SODIUM 40 MG IV SOLR
40.0000 mg | Freq: Every day | INTRAVENOUS | Status: DC
Start: 2013-01-12 — End: 2013-01-15
  Administered 2013-01-12 – 2013-01-15 (×4): 40 mg via INTRAVENOUS
  Filled 2013-01-12 (×4): qty 40

## 2013-01-12 MED ORDER — HYDROMORPHONE HCL PF 1 MG/ML IJ SOLN
0.4000 mg | Freq: Four times a day (QID) | INTRAMUSCULAR | Status: DC | PRN
Start: 2013-01-12 — End: 2013-01-13
  Administered 2013-01-12 – 2013-01-13 (×5): 0.4 mg via INTRAVENOUS
  Filled 2013-01-12 (×4): qty 1

## 2013-01-12 MED ORDER — GLUCAGON HCL (RDNA) 1 MG IJ SOLR
1.0000 mg | INTRAMUSCULAR | Status: DC | PRN
Start: 2013-01-12 — End: 2013-01-19

## 2013-01-12 MED ORDER — GLUCOSE 40 % PO GEL
15.0000 g | ORAL | Status: DC | PRN
Start: 2013-01-12 — End: 2013-01-19

## 2013-01-12 NOTE — Consults (Signed)
[facility]  CONSULTATION NOTE    Date Time: 01/12/2013 10:42 AM  Patient Name: [name]  Requesting Physician: Jillene Bucks, MD       Reason for Consultation:   gastroparesis    Assessment and Plan:   Assessment:  1. 37 y.o. M with diabetic gastroparesis  2. GERD  3. DM-2, poorly controlled.    Plan:  1. Continue erythromycin, would suggest using Reglan sparingly as has higher risk profile long term.  2. Tight glucose control.  3. Continue PPI.  4. Recommend frequent small low fat low residue meals.  5. Avoid narcotics which will slow GI motility further.      History:   [name] is a 37 y.o. male with uncontrolled DM, who presents to the hospital on 01/11/2013 with n/v. He states that these are similar to his previous episodes where he has been admitted for gastroparesis. He was admitted to a hospital in [place] for n/v last Thurs-Fri. He then started feeling bad again Saturday and had worse n/v into Sunday and presented to the ED. He states that he has been taking Protonix, Sucralfate, and Reglan regularly at home and has taken erythromycin "a little." Denies diarrhea, constipation.     Past Medical History:     Past Medical History   Diagnosis Date   . Diabetes    . Hypertensive disorder    . Gastroparesis        Past Surgical History:     Past Surgical History   Procedure Date   . Cholecystectomy    . Knee arthroscopy w/ acl reconstruction      left   . Egd 08/08/2012     Procedure: EGD;  Surgeon: [name], MD;  Location[facility];  Service: Gastroenterology;  Laterality: N/A;  w\bx       Family History:     Family History   Problem Relation Age of Onset   . Diabetes Father    . Diabetes Maternal Grandmother    . Diabetes Paternal Grandmother        Social History:     History     Social History   . Marital Status: Single     Spouse Name: N/A     Number of Children: N/A   . Years of Education: N/A     Social History Main Topics   . Smoking  status: Never Smoker    . Smokeless tobacco: Never Used   . Alcohol Use: No   . Drug Use: No   . Sexually Active: Not on file     Other Topics Concern   . Not on file     Social History Narrative   . No narrative on file       Allergies:   No Known Allergies    Medications:     Current Facility-Administered Medications   Medication Dose Route Frequency   . cloNIDine  1 patch Transdermal Weekly   . enoxaparin  40 mg Subcutaneous Daily   . erythromycin  100 mg Intravenous Q8H   . [COMPLETED] HYDROmorphone  0.4 mg Intravenous Once   . [COMPLETED] HYDROmorphone  0.4 mg Intravenous Once   . [COMPLETED] HYDROmorphone  1 mg Intravenous Once   . [COMPLETED] HYDROmorphone  1 mg Intravenous Once   . metoclopramide  10 mg Intravenous Q6H SCH   . [COMPLETED] ondansetron  4 mg Intravenous Once   . [COMPLETED] ondansetron  4 mg Intravenous Once   .  pantoprazole  40 mg Intravenous Daily   . [COMPLETED] promethazine  12.5 mg Intravenous Once   . promethazine  12.5 mg Intravenous Q6H   . [COMPLETED] sodium chloride  2,000 mL Intravenous Once   . [DISCONTINUED] heparin (porcine)  5,000 Units Subcutaneous Q8H SCH       Review of Systems:   Complete 12 Point ROS negative or normal except those mentioned in HPI above.    Physical Exam:     Filed Vitals:    01/12/13 0801   BP: 177/92   Pulse: 105   Temp: 98.6 F (37 C)   Resp: 20   SpO2: 96%       General appearance: Well developed, well nourished, appears stated age and in NAD  Eyes: Sclera anicteric, pink conjunctivae, no ptosis  ENMT: mucous membranes moist, nose and ears appear normal.  Oropharynx clear.  Chest: Non labored respirations, no audible wheezing, no clubbing or cyanosis  CV:  Regular rate and rhythm, no JVD, no LE edema  Abdomen: soft, non-distended, no masses or organomegaly, mild TTP in epigastrium, no rebound/guarding  Skin: Normal color and turgor, no rashes, no suspicious skin lesions noted  Neuro: CN II-XII grossly intact.  No gross movement disorders  noted.  Mental status: Appropriate affect, alert and oriented x 3    Labs Reviewed:     Recent Labs   [facility] 01/12/13 0437    WBC 9.23    HGB 12.6*    HCT 37.7*    PLT 262    MCV 80.0       Recent Labs   [facility] 01/12/13 0437 01/11/13 2313    NA 139 138    K 4.4 4.7    CL 106 103    CO2 20* 24    BUN 13.0 15.0    CREAT 1.3 1.4*    GLU 260* 271*    CA 9.2 9.5    MG -- --    PHOS -- --       Recent Labs   Basename 01/11/13 2313    AST 17    ALT 27    ALKPHOS 114    BILITOTAL 0.6    BILIDIRECT --    PROT 7.7    ALB 4.3       No results found for this basename: PTT:2,PT:2,INR:2 in the last 72 hours     Radiology:   Radiological Procedure reviewed:    Janine Limbo. Betsey Holiday, MD  Resident, General Surgery  [phone]  Attending:  A resident contributed to the patient care.  I reviewed the history.  I personally examined the patient, including GI ), and reviewed the remainder of the examination.  I agree with the impression and treatment plan.

## 2013-01-12 NOTE — Progress Notes (Signed)
SW attempted to complete CM assessment, but pt is asleep.  Will complete assessment at a later time.

## 2013-01-12 NOTE — Progress Notes (Signed)
FAMILY MEDICINE PROGRESS NOTE    Date Time: 01/12/2013 7:27 AM  Patient Name: [name]  Attending Physician: Jillene Bucks, MD  Team Contact Info: [employer] [phone] 8 am -5 pm: After hours [phone]      Assessment:     Active Hospital Problems    Diagnosis   . Diabetic gastroparesis   . DM (diabetes mellitus), type 1, uncontrolled with complications   . HTN (hypertension)   . Nausea & vomiting   . Type 2 diabetes, uncontrolled, with neuropathy   . Gastroparesis       37 yo male with h/o T1DM with peripheral neuropathy and gastroparesis, HTN presents with intractable nausea vomiting likely 2/2 diabetic gastroparesis     Plan:   # Nausea and vomiting 2/2 diabetic gastroparesis   - try to limit narcotics as it will slow gastric emptying but will try and treat pain as he often gets cyclic vomiting from worsening pain   - QT 442 - will monitor while on several QT prolonging agents   - mIVF   - BMP daily   - GI consult   - Scheduled phenergan for nausea   - Phenergan and Erythromycin for gastric motility - if fails may consider benadryl and ativan   - NPO     # Metabolic Acidosis   - likely secondary to dehydration   - mIVF    # HTN   - home regimen is norvasc 10 mg daily and clonidine 0.1 mg q12h   - Continue clonidine patch for now Hydralizine for BP > 180/100    # IDDM - uncontrolled   - Continue sliding scale while NPO   - When clear liquid diet resumes will start low dose lantus     # Diabetic Neuropathy   -Restart Gabapentin when tolerating PO       # FEN/GI: NPO   #Full Code   # PPX: SCDs, lovenox    Disposition:     Anticipated discharge needs: None       Subjective     CC: nausea and vomiting     Interval History/24 hour events: Persistent non bilious emesis overnight. Has not slept overnight. Complains of epigastric 10/10 pain. Denies chest pain, shortness of breath           Physical Exam:       VITAL SIGNS PHYSICAL EXAM   Temp:  [97.1 F (36.2 C)-97.8 F (36.6 C)] 97.8 F (36.6 C)  Heart Rate:   [82-125] 97   Resp Rate:  [18-22] 20   BP: (152-199)/(85-101) 192/101 mmHg         Intake/Output Summary (Last 24 hours) at 01/12/13 6213  Last data filed at 01/12/13 0700   Gross per 24 hour   Intake      0 ml   Output      5 ml   Net     -5 ml    Physical Exam  Gen: in mild distress diaphoretic,   HEENT: dry MMM  Cardiac: RRR no Murmurs   Lungs: Clear to auscultation   Abdomen: soft , + Epigastric pain, no rebound   Ext: no edema        Meds:     Medications were reviewed.    Labs:       Labs (last 72 hours):      Lab 01/12/13 0437   WBC 9.23   HGB 12.6*   HCT 37.7*   PLT 262  No results found for this basename: PT:2,INR:2,PTT:2 in the last 168 hours   Lab 01/12/13 0437 01/11/13 2313   NA 139 138   K 4.4 4.7   CL 106 103   CO2 20* 24   BUN 13.0 15.0   CREAT 1.3 1.4*   CA 9.2 9.5   ALB -- 4.3   PROT -- 7.7   BILITOTAL -- 0.6   ALKPHOS -- 114   ALT -- 27   AST -- 17   GLU 260* 271*            Lipase 63 on admission      Microbiology, reviewed and are significant for:  None       Imaging personally reviewed, including:   AXR: no acute process      Case discussed with: Dr. Anselm Jungling    Signed by: Kristine Linea, MD PGY-2  Pager [phone]  [facility]   [address]   [place], [place] [address]

## 2013-01-12 NOTE — ED Notes (Signed)
Attempted to call report to PCCU x [phone] and unable to take report at this time.  Advised to call back in 5 min.

## 2013-01-12 NOTE — H&P (Signed)
ADMISSION HISTORY AND PHYSICAL EXAM    Date Time: 01/12/2013 2:36 AM  Patient Name: [name]  Attending Physician: Artis Flock, MD  Primary Care Physician: [name], MD    CC: Nausea and Vomiting       History of Presenting Illness:   [name] is a 37 y.o. male with a PMH significant for Gastroparesis (multiple prior admissions), uncontrolled IDDM, and HTN who presents to the hospital with intractable nausea and vomiting starting yesterday afternoon.  Pt states this is similar to his previous episodes of gastroparesis, and states he was recently hospitalized overnight in a hospital in [place] 4 days for the same issue.  Yesterday he reports eating greens which his thinks is what triggered this most recent episode, and 4 days ago it was because he ate too much.  Reports emesis as yellow in color, and pain in the upper abdomen.  Denies having any CP, palpitations, SOB, f/c, diarrhea, constipation, HA, or vision changes.  States he did not follow up with GI after last admission for evaluation of Domperidone.  Reports compliance with medications.        ED Course:  - KUB negative for acute process   - s/p Zofran, Phenergan, Dilaudid IV, and 2L NS bolus with minimal relief   - Glucose 271, Cr 1.4   - Lipase and LFTs wnl    GI: [facility]      Past Medical History:     Past Medical History   Diagnosis Date   . Diabetes    . Hypertensive disorder    . Gastroparesis        Available old records reviewed    Past Surgical History:     Past Surgical History   Procedure Date   . Cholecystectomy    . Knee arthroscopy w/ acl reconstruction      left   . Egd 08/08/2012     Procedure: EGD;  Surgeon: [name], MD;  Location: [facility];  Service: Gastroenterology;  Laterality: N/A;  w\bx       Family History:     Family History   Problem Relation Age of Onset   . Diabetes Father    . Diabetes Maternal Grandmother    . Diabetes Paternal Grandmother        Social  History:     History     Social History   . Marital Status: Single     Spouse Name: N/A     Number of Children: N/A   . Years of Education: N/A     Social History Main Topics   . Smoking status: Never Smoker    . Smokeless tobacco: Never Used   . Alcohol Use: No   . Drug Use: No   . Sexually Active: Not on file     Other Topics Concern   . Not on file     Social History Narrative   . No narrative on file       Allergies:   No Known Allergies    Medications:     Prior to Admission medications    Medication Sig Start Date End Date Taking? Authorizing Provider   amLODIPine (NORVASC) 5 MG tablet Take 2 tablets (10 mg total) by mouth daily. 10/10/12   Noyes, Orland Jarred, MD   cloNIDine (CATAPRES) 0.1 MG tablet Take 1 tablet (0.1 mg total) by mouth every 12 (twelve) hours. 10/26/12   Aquilla Hacker, MD  insulin glargine (LANTUS) 100 UNIT/ML injection Inject 20 Units into the skin nightly. 10/10/12   Noyes, Orland Jarred, MD   LORazepam (ATIVAN) 0.5 MG tablet Take 1 tablet (0.5 mg total) by mouth every 6 (six) hours as needed. PRN cyclic vomiting 10/26/12   Aquilla Hacker, MD   pantoprazole (PROTONIX) 40 MG tablet Take 1 tablet (40 mg total) by mouth 2 (two) times daily. 10/07/12   Noyes, Orland Jarred, MD   promethazine (PHENERGAN) 12.5 MG tablet Take 1 tablet (12.5 mg total) by mouth every 6 (six) hours. 10/26/12   Aquilla Hacker, MD   sucralfate (CARAFATE) 1 G tablet Take 1 tablet (1 g total) by mouth 4 (four) times daily. 10/07/12   Noyes, Orland Jarred, MD   traMADol (ULTRAM) 50 MG tablet Take 2 tablets (100 mg total) by mouth every 6 (six) hours as needed. 10/10/12   Kristine Linea, MD       Review of Systems:   All other systems were reviewed and are negative except: see HPI    Physical Exam:   Patient Vitals for the past 24 hrs:   BP Temp Pulse Resp SpO2   01/12/13 0107 169/98 mmHg - 88  18  97 %   01/12/13 0104 169/98 mmHg - - - -   01/11/13 2202 199/96 mmHg 97.1 F (36.2 C) 125  22  99 %     There is no height or weight  on file to calculate BMI.  No intake or output data in the 24 hours ending 01/12/13 0236    General: in distress   HEENT: perrla, eomi, sclera anicteric  oropharynx clear without lesions, mucous membranes dry   Neck: supple, no lymphadenopathy, no thyromegaly, no JVD, no carotid bruits  Cardiovascular: regular rate and rhythm, no murmurs, rubs or gallops  Lungs: clear to auscultation bilaterally, without wheezing, rhonchi, or rales  Abdomen: mildly distended, TTP of RUQ; no palpable masses, no hepatosplenomegaly, normoactive bowel sounds, no rebound or guarding  Extremities: no clubbing, no cyanosis, 1+ pitting edema BLE   Neuro: cranial nerves grossly intact, strength 5/5 in upper and lower extremities, sensation intact,   Skin: no rashes or lesions noted        Labs:     Results     Procedure Component Value Units Date/Time    Comprehensive Metabolic Panel (CMP) [[id number]]  (Abnormal) Collected:01/11/13 2313    Specimen Information:Blood Updated:01/11/13 2341     Glucose 271 (H) mg/dL      BUN 40.9 mg/dL      Creatinine 1.4 (H) mg/dL      Sodium 811 mEq/L      Potassium 4.7 mEq/L      Chloride 103 mEq/L      CO2 24 mEq/L      CALCIUM 9.5 mg/dL      Protein, Total 7.7 g/dL      Albumin 4.3 g/dL      AST (SGOT) 17 U/L      ALT 27 U/L      Alkaline Phosphatase 114 U/L      Bilirubin, Total 0.6 mg/dL      Globulin 3.4 g/dL      Albumin/Globulin Ratio 1.3     LAB-Lipase [[id number]] Collected:01/11/13 2313    Specimen Information:Blood Updated:01/11/13 2341     Lipase 63 U/L     GFR [[id number]] Collected:01/11/13 2313     EGFR >60.0 Updated:01/11/13 2341          Imaging personally  reviewed      Assessment:     Patient Active Problem List    Diagnosis Date Noted   . Intractable nausea and vomiting 10/05/2012   . Diabetic gastroparesis 08/22/2012   . Intractable vomiting 08/05/2012   . DM (diabetes mellitus), type 1, uncontrolled with complications 08/05/2012   . HTN (hypertension) 08/05/2012   . Type 2 diabetes,  uncontrolled, with neuropathy 06/27/2012   . Hypertension 06/27/2012   . Nausea & vomiting 06/27/2012   . GERD (gastroesophageal reflux disease) 06/17/2012   . Gastroparesis 06/17/2012   . Dehydration, moderate 06/17/2012   . Gastritis without bleeding 06/17/2012       [name] is a 37 y.o. male with a PMH significant for Gastroparesis (multiple prior admissions), uncontrolled IDDM, and HTN who presents to the hospital with intractable nausea and vomiting starting yesterday afternoon.    Plan:     # Nausea and Vomiting, likely 2/2 Gastroparesis flare  - Phenergan 12.5 mg IV q6h scheduled  - Reglan 10 mg IV TID scheduled   - Erythromycin 100 IV q8h scheduled    - Carafate 1 gm PO QID when tolerating PO   - Protonix IV   - can consider Ativan q6h with Benadryl IV q6h if n/v not improved.  This has been done on previous admission.   - will try to avoid opiates since they decrease GI motility   - IVFs   - NPO  - consult GI in the AM    # IDDM, uncontrolled  - takes Lantus 36 units qhs  - will order SSI while NPO    # AKI, likely pre-renal in setting of dehydration  - IVFs as noted above  - will monitor     # HTN  - Hydralazine PRN  - will switch clonidine to patch  - hold oral meds until n/v better controlled (Norvasc, Clonidine)    Code: FULL  Diet: NPO  PPX: SCDs, SQH      Status/Rationale:  Admit to observation    D/w Dr. Quintella Baton    Signed by: Esmond Camper, MD   ZO:[id number], Vania Rea, MD

## 2013-01-12 NOTE — Progress Notes (Signed)
------------------------------------------------------------------------------------------------------------------    [facility] Attending Physician Addendum / Note    This patient was seen / examined / discussed with R2 / Dr. Army Melia.  Physical exam duplicated.  I agree with assessment and plan as outlined, with any additions or revisions below.     - cont Zofran / phenergan / Reglan / e-mycin / IVF  - diet as tolerated  - GI consult

## 2013-01-12 NOTE — Plan of Care (Signed)
Pt admitted from ED. A&Ox4. SR. BP elevated. Pt complains of abd pain 8/10, IV dilaudid ordered and given. Pt continues to have N/V. Emesis x4, small amounts. Oriented to room and call bell. IV fluids running at 100 ml/hr. Will continue to monitor.

## 2013-01-13 LAB — CBC AND DIFFERENTIAL
Basophils Absolute Automated: 0.02 (ref 0.00–0.20)
Basophils Automated: 0 %
Eosinophils Absolute Automated: 0 (ref 0.00–0.70)
Eosinophils Automated: 0 %
Hematocrit: 38.9 % — ABNORMAL LOW (ref 42.0–52.0)
Hgb: 12.8 g/dL — ABNORMAL LOW (ref 13.0–17.0)
Immature Granulocytes Absolute: 0.05
Immature Granulocytes: 1 %
Lymphocytes Absolute Automated: 1.32 (ref 0.50–4.40)
Lymphocytes Automated: 16 %
MCH: 26.4 pg — ABNORMAL LOW (ref 28.0–32.0)
MCHC: 32.9 g/dL (ref 32.0–36.0)
MCV: 80.4 fL (ref 80.0–100.0)
MPV: 10 fL (ref 9.4–12.3)
Monocytes Absolute Automated: 0.31 (ref 0.00–1.20)
Monocytes: 4 %
Neutrophils Absolute: 6.82 (ref 1.80–8.10)
Neutrophils: 80 %
Nucleated RBC: 0 (ref 0–1)
Platelets: 301 (ref 140–400)
RBC: 4.84 (ref 4.70–6.00)
RDW: 12 % (ref 12–15)
WBC: 8.52 (ref 3.50–10.80)

## 2013-01-13 LAB — HEMOGLOBIN A1C: Hemoglobin A1C: 9.6 % — ABNORMAL HIGH (ref 0.0–6.0)

## 2013-01-13 LAB — GFR: EGFR: >60

## 2013-01-13 LAB — BASIC METABOLIC PANEL
BUN: 11 mg/dL (ref 9.0–21.0)
CO2: 22 mEq/L (ref 22–29)
Calcium: 9 mg/dL (ref 8.5–10.5)
Chloride: 104 mEq/L (ref 98–107)
Creatinine: 1.3 mg/dL (ref 0.7–1.3)
Glucose: 249 mg/dL — ABNORMAL HIGH (ref 70–100)
Potassium: 4.1 mEq/L (ref 3.5–5.1)
Sodium: 138 mEq/L (ref 136–145)

## 2013-01-13 LAB — GLUCOSE WHOLE BLOOD - POCT
Whole Blood Glucose POCT: 238 mg/dL — ABNORMAL HIGH (ref 70–100)
Whole Blood Glucose POCT: 228 mg/dL — ABNORMAL HIGH (ref 70–100)
Whole Blood Glucose POCT: 221 mg/dL — ABNORMAL HIGH (ref 70–100)
Whole Blood Glucose POCT: 259 mg/dL — ABNORMAL HIGH (ref 70–100)

## 2013-01-13 LAB — MAGNESIUM: Magnesium: 2 mg/dL (ref 1.6–2.6)

## 2013-01-13 MED ORDER — PROMETHAZINE HCL 25 MG/ML IJ SOLN
12.5000 mg | Freq: Four times a day (QID) | INTRAMUSCULAR | Status: DC
Start: 2013-01-13 — End: 2013-01-14
  Administered 2013-01-13 – 2013-01-14 (×4): 12.5 mg via INTRAVENOUS
  Filled 2013-01-13 (×4): qty 1

## 2013-01-13 MED ORDER — HYDROMORPHONE HCL PF 1 MG/ML IJ SOLN
0.4000 mg | INTRAMUSCULAR | Status: DC | PRN
Start: 2013-01-13 — End: 2013-01-19
  Administered 2013-01-13 – 2013-01-18 (×18): 0.4 mg via INTRAVENOUS
  Filled 2013-01-13 (×18): qty 1

## 2013-01-13 MED ORDER — SODIUM CHLORIDE 0.9 % IV SOLN
25.0000 mg | Freq: Once | INTRAVENOUS | Status: AC
Start: 2013-01-13 — End: 2013-01-13
  Administered 2013-01-13: 25 mg via INTRAVENOUS
  Filled 2013-01-13: qty 1

## 2013-01-13 MED ORDER — DIAZEPAM 5 MG/ML IJ SOLN
5.0000 mg | Freq: Three times a day (TID) | INTRAMUSCULAR | Status: DC | PRN
Start: 2013-01-13 — End: 2013-01-15
  Administered 2013-01-13 – 2013-01-15 (×6): 5 mg via INTRAVENOUS
  Filled 2013-01-13 (×6): qty 2

## 2013-01-13 MED ORDER — TRAMADOL HCL 50 MG PO TABS
50.0000 mg | ORAL_TABLET | Freq: Four times a day (QID) | ORAL | Status: DC | PRN
Start: 2013-01-13 — End: 2013-01-19
  Administered 2013-01-13 – 2013-01-15 (×2): 50 mg via ORAL
  Filled 2013-01-13 (×3): qty 1

## 2013-01-13 MED ORDER — DIPHENHYDRAMINE HCL 50 MG/ML IJ SOLN
12.5000 mg | Freq: Four times a day (QID) | INTRAMUSCULAR | Status: DC | PRN
Start: 2013-01-13 — End: 2013-01-19
  Administered 2013-01-13 – 2013-01-18 (×6): 12.5 mg via INTRAVENOUS
  Filled 2013-01-13 (×6): qty 1

## 2013-01-13 NOTE — Plan of Care (Signed)
Problem: Safety  Goal: Patient will be free from injury during hospitalization  Outcome: Progressing  Safety precautions in place. Pt's need anticipated. Will continue to monitor.    Problem: Pain  Goal: Patient's pain/discomfort is manageable  Outcome: Progressing  Dilaudid given for abdominal pain as scheduled. Norco given X1 with minimal relief. Phenergan and reglan given as scheduled. Emesis X4 this shift.Marland Kitchen

## 2013-01-13 NOTE — OT Eval Note (Addendum)
[facility]   Occupational Therapy Evaluation/Discharge    Patient: [name]    MRN#: [id number]   [facility]  Bed: FI470/FI470-01                                     Discharge Recommendations:   Discharge Recommendation: Home with no needs   DME Recommended for Discharge: None      Assessment:   OSKER [name] is a 37 y.o. male admitted 01/11/2013 with nausea and vomiting.  Pt presents at/near functional baseline, independent with basic ADLs.  No acute OT needs identified. D/C acute OT services.    Therapy Diagnosis: N/A    Rehabilitation Potential: N/A    Treatment Activities: Evaluation  Educated the patient to role of occupational therapy, plan of care, goals of therapy and discharge instructions.    Plan:   D/C acute OT services     Treatment/Interventions: N/A    Risks/benefits/POC discussed yes       Precautions and Contraindications:   Falls    Consult received for [name] for OT Evaluation and Treatment.  Patient's medical condition is appropriate for Occupational Therapy intervention at this time.      History of Present Illness:    [name] is a 37 y.o. male admitted on 01/11/2013 with PMH significant for Gastroparesis (multiple prior admissions), uncontrolled IDDM, and HTN who presents to the hospital with intractable nausea and vomiting starting yesterday afternoon. Pt states this is similar to his previous episodes of gastroparesis, and states he was recently hospitalized overnight in a hospital in [place] 4 days for the same issue. Yesterday he reports eating greens which his thinks is what triggered this most recent episode, and 4 days ago it was because he ate too much. Reports emesis as yellow in color, and pain in the upper abdomen. Denies having any CP, palpitations, SOB, f/c, diarrhea, constipation, HA, or vision changes. States he did not follow up with GI after last admission for evaluation of Domperidone. Reports compliance  with medications.       Admitting Diagnosis: Gastroparesis [536.3]  Gastroparesis  Gastroparesis    Past Medical/Surgical History:  Past Medical History    Diagnosis  Date    .  Diabetes     .  Hypertensive disorder     .  Gastroparesis       Available old records reviewed   Past Surgical History:      Past Surgical History    Procedure  Date    .  Cholecystectomy     .  Knee arthroscopy w/ acl reconstruction       left    .  Egd  08/08/2012      Procedure: EGD; Surgeon: [name], MD; Location: [facility]; Service: Gastroenterology; Laterality: N/A; w\bx        Imaging/Tests/Labs:  11/30 IMPRESSION:   No acute abnormality.      Social History:   Prior Level of Function:  independent  Assistive Devices: none  Baseline Activity: home and community ambulator. Drives, works as Engineer, materials.  DME Currently at Home: none  Home Living Arrangements: lives with girlfriend  Type of Home: townhouse  Home Layout: 8 STE, one flight of stairs to bedroom on top level. Tub/shower    Subjective:    Patient is agreeable to participation in the therapy  session. Nursing clears patient for therapy.     Patient Goal: to get better  Pain:   Scale: 3/10  Location: stomach  Intervention: pt states that he received pain meds    Objective:   Patient received  in bed with IV in place.    Cognitive Status and Neuro Exam:  Alert and oriented    Musculoskeletal Examination  Gross ROM: WFL  Gross Strength: WFL      Sensory/Oculomotor Examination  Auditory: WFL  Tactile: WFL  Vision: WFL      Activities of Daily Living  Eating: Independent  Grooming: Independent  Bathing: Independent  UE Dressing: Independent  LE Dressing: Independent  Toileting: Independent    Functional Mobility:  Supine to Sit: Independent  Sit to Stand: Independent  Transfers: Independent     Balance  Static Sitting: good  Dynamic Sitting: good  Static Standing: good  Dynamic Standing: good    Participation and Activity Tolerance  Participation Effort:  good  Endurance: good    Patient left with call bell within reach, all needs met and all questions answered, RN notified of session outcome and patient response.      Goals:                                  Jacalyn Lefevre, MS, OTR/L       Time of treatment:   OT Received On: 01/13/13  Start Time: 1445  Stop Time: 1510  Time Calculation (min): 25 min

## 2013-01-13 NOTE — Progress Notes (Signed)
FAMILY MEDICINE PROGRESS NOTE    Date Time: 01/13/2013 6:33 AM  Patient Name: [name]  Attending Physician: Jillene Bucks, MD  Team Contact Info: [employer] [phone] 8 am -5 pm: After hours [phone]      Assessment:     Active Hospital Problems    Diagnosis   . Diabetic gastroparesis   . DM (diabetes mellitus), type 1, uncontrolled with complications   . HTN (hypertension)   . Nausea & vomiting   . Type 2 diabetes, uncontrolled, with neuropathy   . Gastroparesis       37 yo male with h/o T1DM with peripheral neuropathy and gastroparesis, HTN presents with intractable nausea vomiting likely 2/2 diabetic gastroparesis     Plan:   # Nausea and vomiting 2/2 diabetic gastroparesis   - try to limit narcotics as it will slow gastric emptying but will try and treat pain as he often gets cyclic vomiting from worsening pain   - QT 442 - will monitor while on several QT prolonging agents - Tele and repeat EKG tomorrow   - mIVF   - BMP daily   - GI consult   - Scheduled phenergan for nausea, Benadryl PRN- Can try and increase phenergan dose if does not tolerate  - Reglan and Erythromycin for gastric motility   - NPO     # Hiccups   - Will try a dose of thorazine IV given persistent vomiting   - If successful can try IV versus oral if tolerated   - Monitor QT as patient is on several QT prolonging medications     # Metabolic Acidosis - improving HCO3- 22  - likely secondary to dehydration   - mIVF    # HTN   - home regimen is norvasc 10 mg daily and clonidine 0.1 mg q12h   - Continue clonidine patch for now Hydralizine for BP > 180/100    # IDDM - uncontrolled - HbA1c 9.1   - Continue sliding scale while NPO   - When clear liquid diet resumes will start low dose lantus     # Diabetic Neuropathy   -Restart Gabapentin when tolerating PO       # FEN/GI: NPO   #Full Code   # PPX: SCDs, lovenox    Disposition:     Anticipated discharge needs: None       Subjective     CC: nausea and vomiting     Interval History/24 hour  events: Persistent non bilious emesis overnight with too many episodes to count. Has not slept overnight. pain. Denies chest pain, shortness of breath. He is complaining of hiccups. He states the pain is unbearable and the pain is what triggers his episodes of vomiting         Physical Exam:       VITAL SIGNS PHYSICAL EXAM   Temp:  [97.3 F (36.3 C)-98.8 F (37.1 C)] 97.3 F (36.3 C)  Heart Rate:  [89-107] 107   Resp Rate:  [16-20] 18   BP: (138-177)/(77-92) 160/88 mmHg         Intake/Output Summary (Last 24 hours) at 01/13/13 2956  Last data filed at 01/12/13 0700   Gross per 24 hour   Intake      0 ml   Output      5 ml   Net     -5 ml    Physical Exam  Gen: lying in bed, no acute distress    HEENT: dry MMM  Cardiac: RRR no Murmurs   Lungs: Clear to auscultation   Abdomen: soft , + Epigastric pain, no distension, no rebound   Ext: no edema        Meds:     Medications were reviewed.    Labs:       Labs (last 72 hours):      Lab 01/13/13 0446 01/12/13 0437   WBC 8.52 9.23   HGB 12.8* 12.6*   HCT 38.9* 37.7*   PLT 301 262       No results found for this basename: PT:2,INR:2,PTT:2 in the last 168 hours   Lab 01/13/13 0446 01/12/13 0437 01/11/13 2313   NA 138 139 --   K 4.1 4.4 --   CL 104 106 --   CO2 22 20* --   BUN 11.0 13.0 --   CREAT 1.3 1.3 --   CA 9.0 9.2 --   ALB -- -- 4.3   PROT -- -- 7.7   BILITOTAL -- -- 0.6   ALKPHOS -- -- 114   ALT -- -- 27   AST -- -- 17   GLU 249* 260* --         HbA1c: 9.1   Lipase 63 on admission      Microbiology, reviewed and are significant for:  None       Imaging personally reviewed, including:   AXR: no acute process      Case discussed with: Dr. Anselm Jungling    Signed by: Kristine Linea, MD PGY-2  Pager [phone]  [facility]   [address]   [place], [place] [address]

## 2013-01-13 NOTE — Progress Notes (Signed)
------------------------------------------------------------------------------------------------------------------    [facility] Attending Physician Addendum / Note    This patient was seen / examined / discussed with R2 / Dr. Army Melia.  Physical exam duplicated.  I agree with assessment and plan as outlined, with any additions or revisions below.     - trial of Thorazine and Valium  - cont Zofran / Phenergan / Reglan / E-mycin / IVF / PPI  - glycemic control / SSI  - avoid opiates for pn control; favor Tramadol if needed for pn control  - diet as tolerated  - await further per GI consult

## 2013-01-13 NOTE — Progress Notes (Addendum)
[facility]   PROGRESS NOTE    Date Time: 01/13/2013 8:16 AM  Patient Name: [name], [name]      Chief Complaint:   Gastroparesis, n/v    Assessment and Plan:   Assessment:   1. 37 y.o. M with diabetic gastroparesis   2. GERD   3. DM-2, poorly controlled.     Plan:   1. Continue Erythromycin standing dose, Reglan as needed.  2. Tight glucose control.   3. Continue PPI.   4. Recommend frequent small low fat low residue meals.   5. Avoid narcotics which will slow GI motility further.  6. Advance diet as tolerated.    Subjective:   Continued n/v. Denies pain. Glucoses 200s-230s.     Medications:     Current Facility-Administered Medications   Medication Dose Route Frequency   . cloNIDine  1 patch Transdermal Weekly   . enoxaparin  40 mg Subcutaneous Daily   . erythromycin  100 mg Intravenous Q8H   . [COMPLETED] HYDROmorphone  0.4 mg Intravenous Once   . metoclopramide  10 mg Intravenous Q6H SCH   . pantoprazole  40 mg Intravenous Daily   . promethazine  12.5 mg Intravenous Q6H   . [COMPLETED] traMADol  50 mg Oral Once       Review of Systems:   Complete 12 Point ROS negative or normal except those mentioned in HPI above.    Physical Exam:     Filed Vitals:    01/13/13 0438   BP: 160/88   Pulse: 107   Temp: 97.3 F (36.3 C)   Resp: 18   SpO2: 96%       General appearance: Well developed, well nourished, appears stated age and in NAD   Eyes: Sclera anicteric, pink conjunctivae, no ptosis   ENMT: mucous membranes moist, nose and ears appear normal. Oropharynx clear.   Chest: Non labored respirations, no audible wheezing, no clubbing or cyanosis   CV: Regular rate and rhythm, no JVD, no LE edema   Abdomen: soft, non-distended, no masses or organomegaly, mild TTP in epigastrium, no rebound/guarding   Skin: Normal color and turgor, no rashes, no suspicious skin lesions noted   Neuro: CN II-XII grossly intact. No gross movement disorders noted.   Mental status: Appropriate affect, alert  and oriented x 3      Labs:     Recent Labs   [facility] 01/13/13 0446 01/12/13 0437    WBC 8.52 9.23    HGB 12.8* 12.6*    HCT 38.9* 37.7*    PLT 301 262    MCV 80.4 80.0       Recent Labs   Basename 01/13/13 0446 01/12/13 0437    NA 138 139    K 4.1 4.4    CL 104 106    CO2 22 20*    BUN 11.0 13.0    CREAT 1.3 1.3    GLU 249* 260*    CA 9.0 9.2    MG 2.0 --    PHOS -- --       Recent Labs   Basename 01/11/13 2313    AST 17    ALT 27    ALKPHOS 114    BILITOTAL 0.6    BILIDIRECT --    PROT 7.7    ALB 4.3       No results found for this basename: PTT:2,PT:2,INR:2 in the last 72 hours     Radiology:   Radiological Procedure reviewed:  Endoscopy:     EGD:  -n/a  Colonoscopy: n/a  ERCP:   -n/a, -n/a    Pathology:   n/a      Janine Limbo. Betsey Holiday, MD  Resident, General Surgery  [phone]  Attending:  A resident contributed to the patient care.  I reviewed the history.  I personally examined the patient, including GI, and reviewed the remainder of the examination.  I agree with the impression and treatment plan.

## 2013-01-13 NOTE — Progress Notes (Signed)
Pt A&OX3.. Dilaudid Iv given for pain with no relief per pt. MD notified. Ultram ordered per pt request. Phenergan and Reglan given as scheduled. Pt vomiting small amounts of clear emesis. NS @ 100 cc/hr infusing as ordered. Safety precautions in place. Will continue to monitor.

## 2013-01-13 NOTE — PT Progress Note (Signed)
Physical Therapy Cancellation Note    Patient: [name]  HQI:[id number]    Unit: [id number]/MW102-72    Patient not seen for physical therapy secondary to patient is independent with functional mobility, gait.  Spoke with OT, patient does not present with any acute skilled PT needs at this time.  Will sign off and complete PT order.    Renford Dills, PT, DPT, GCS  Pager [phone]

## 2013-01-14 LAB — ECG 12-LEAD
Atrial Rate: 99 {beats}/min
P Axis: 57 degrees
P-R Interval: 164 ms
Q-T Interval: 362 ms
QRS Duration: 90 ms
QTC Calculation (Bezet): 464 ms
R Axis: 40 degrees
T Axis: 9 degrees
Ventricular Rate: 99 {beats}/min

## 2013-01-14 LAB — CBC AND DIFFERENTIAL
Basophils Absolute Automated: 0.01 (ref 0.00–0.20)
Basophils Automated: 0 %
Eosinophils Absolute Automated: 0 (ref 0.00–0.70)
Eosinophils Automated: 0 %
Hematocrit: 38.4 % — ABNORMAL LOW (ref 42.0–52.0)
Hgb: 12.9 g/dL — ABNORMAL LOW (ref 13.0–17.0)
Immature Granulocytes Absolute: 0.03
Immature Granulocytes: 0 %
Lymphocytes Absolute Automated: 1.73 (ref 0.50–4.40)
Lymphocytes Automated: 18 %
MCH: 26.8 pg — ABNORMAL LOW (ref 28.0–32.0)
MCHC: 33.6 g/dL (ref 32.0–36.0)
MCV: 79.8 fL — ABNORMAL LOW (ref 80.0–100.0)
MPV: 9.8 fL (ref 9.4–12.3)
Monocytes Absolute Automated: 0.54 (ref 0.00–1.20)
Monocytes: 6 %
Neutrophils Absolute: 7.13 (ref 1.80–8.10)
Neutrophils: 76 %
Nucleated RBC: 0 (ref 0–1)
Platelets: 273 (ref 140–400)
RBC: 4.81 (ref 4.70–6.00)
RDW: 12 % (ref 12–15)
WBC: 9.44 (ref 3.50–10.80)

## 2013-01-14 LAB — BASIC METABOLIC PANEL
BUN: 14 mg/dL (ref 9.0–21.0)
CO2: 21 mEq/L — ABNORMAL LOW (ref 22–29)
Calcium: 9 mg/dL (ref 8.5–10.5)
Chloride: 104 mEq/L (ref 98–107)
Creatinine: 1.3 mg/dL (ref 0.7–1.3)
Glucose: 262 mg/dL — ABNORMAL HIGH (ref 70–100)
Potassium: 4.1 mEq/L (ref 3.5–5.1)
Sodium: 140 mEq/L (ref 136–145)

## 2013-01-14 LAB — GLUCOSE WHOLE BLOOD - POCT
Whole Blood Glucose POCT: 224 mg/dL — ABNORMAL HIGH (ref 70–100)
Whole Blood Glucose POCT: 215 mg/dL — ABNORMAL HIGH (ref 70–100)
Whole Blood Glucose POCT: 224 mg/dL — ABNORMAL HIGH (ref 70–100)
Whole Blood Glucose POCT: 187 mg/dL — ABNORMAL HIGH (ref 70–100)

## 2013-01-14 LAB — MAGNESIUM: Magnesium: 2.1 mg/dL (ref 1.6–2.6)

## 2013-01-14 LAB — GFR: EGFR: >60

## 2013-01-14 MED ORDER — INSULIN GLARGINE 100 UNIT/ML SC SOLN
10.0000 [IU] | Freq: Every morning | SUBCUTANEOUS | Status: DC
Start: 2013-01-14 — End: 2013-01-15
  Administered 2013-01-14 – 2013-01-15 (×2): 10 [IU] via SUBCUTANEOUS

## 2013-01-14 MED ORDER — INSULIN ASPART 100 UNIT/ML SC SOLN
1.0000 [IU] | SUBCUTANEOUS | Status: DC | PRN
Start: 2013-01-14 — End: 2013-01-19
  Administered 2013-01-14: 1 [IU] via SUBCUTANEOUS
  Administered 2013-01-14: 4 [IU] via SUBCUTANEOUS
  Administered 2013-01-15: 2 [IU] via SUBCUTANEOUS
  Administered 2013-01-15: 4 [IU] via SUBCUTANEOUS
  Administered 2013-01-15: 2 [IU] via SUBCUTANEOUS
  Administered 2013-01-16: 7 [IU] via SUBCUTANEOUS
  Administered 2013-01-17 (×2): 4 [IU] via SUBCUTANEOUS
  Administered 2013-01-17 – 2013-01-18 (×2): 2 [IU] via SUBCUTANEOUS
  Administered 2013-01-18: 1 [IU] via SUBCUTANEOUS
  Administered 2013-01-19 (×2): 4 [IU] via SUBCUTANEOUS
  Filled 2013-01-14: qty 10
  Filled 2013-01-14: qty 40
  Filled 2013-01-14: qty 0.12

## 2013-01-14 MED ORDER — METOCLOPRAMIDE HCL 5 MG/ML IJ SOLN
10.0000 mg | Freq: Four times a day (QID) | INTRAMUSCULAR | Status: DC | PRN
Start: 2013-01-14 — End: 2013-01-17
  Administered 2013-01-15 – 2013-01-17 (×5): 10 mg via INTRAVENOUS
  Filled 2013-01-14 (×5): qty 2

## 2013-01-14 MED ORDER — SUCRALFATE 1 GM/10ML PO SUSP
1.0000 g | Freq: Four times a day (QID) | ORAL | Status: DC
Start: 2013-01-14 — End: 2013-01-19
  Administered 2013-01-14 – 2013-01-19 (×18): 1 g via ORAL
  Filled 2013-01-14 (×24): qty 10

## 2013-01-14 MED ORDER — PROMETHAZINE HCL 25 MG/ML IJ SOLN
25.0000 mg | Freq: Four times a day (QID) | INTRAMUSCULAR | Status: DC
Start: 2013-01-14 — End: 2013-01-19
  Administered 2013-01-14 – 2013-01-19 (×19): 25 mg via INTRAVENOUS
  Filled 2013-01-14 (×20): qty 1

## 2013-01-14 MED ORDER — POLYETHYLENE GLYCOL 3350 17 G PO PACK
17.0000 g | PACK | Freq: Two times a day (BID) | ORAL | Status: DC
Start: 2013-01-14 — End: 2013-01-19
  Administered 2013-01-15 – 2013-01-17 (×3): 17 g via ORAL
  Filled 2013-01-14 (×6): qty 1

## 2013-01-14 MED ORDER — CHLORPROMAZINE HCL 25 MG PO TABS
25.0000 mg | ORAL_TABLET | Freq: Three times a day (TID) | ORAL | Status: DC | PRN
Start: 2013-01-14 — End: 2013-01-17
  Administered 2013-01-14 – 2013-01-15 (×2): 25 mg via ORAL
  Filled 2013-01-14 (×4): qty 1

## 2013-01-14 NOTE — Progress Notes (Signed)
   Pt AOX4,VSS, NSR,RA sats >92%.   Continues to report nauseas and has had 2 episodes of vomiting. States phenergan does "helps a little bit."   Reports epigastric,abdominal pain usually rates it at 7-8/10, reports relief with Dilaudid and Valium but for a shot time.   Pt not eating or drinking much due to nauseas, encouraged ginger ale and crackers at this point, refused advancing diet at this time.   Has been OOB to BR and refused further ambulation.    Plan of care  explained to pt and verbalized understanding and  pt denies any questions or concerns at this time.   Falls safety plan of care reviewed with pt and houlry rounding maintained.       Plan: Will continue with current POC, monitor pt comfort, medicate as needed,hourly rounding.

## 2013-01-14 NOTE — Plan of Care (Signed)
Pt  Continues with vomiting and complaints of pain.

## 2013-01-14 NOTE — Progress Notes (Addendum)
FAMILY MEDICINE PROGRESS NOTE    Date Time: 01/14/2013 7:52 AM  Patient Name: [name]  Attending Physician: Jillene Bucks, MD  Team Contact Info: Spectra [phone] 8 am -5 pm: After hours [phone]      Assessment:     Active Hospital Problems    Diagnosis   . Diabetic gastroparesis   . DM (diabetes mellitus), type 1, uncontrolled with complications   . HTN (hypertension)   . Nausea & vomiting   . Type 2 diabetes, uncontrolled, with neuropathy   . Gastroparesis       37 yo male with h/o T1DM with peripheral neuropathy and gastroparesis, HTN presents with intractable nausea vomiting likely 2/2 diabetic gastroparesis     Plan:   # Nausea and vomiting 2/2 diabetic gastroparesis   - try to limit narcotics as it will slow gastric emptying but will try and treat pain as he often gets cyclic vomiting from worsening pain   - QT 442 - will monitor while on several QT prolonging agents - Tele and repeat EKG tomorrow   - mIVF   - BMP daily   - GI consult   -  Phenergan Scheduled for nausea, Benadryl PRN for nausea   - Reglan and Erythromycin for gastric motility   - advance diet to clear liquid     # Hiccups   - s/p thorazine IV given persistent vomiting which did help  - IV doses cannot be repeated per pharmacy so will transition to PO- Patient will need EKG   - Monitor QT as patient is on several QT prolonging medications     # Metabolic Acidosis - improving HCO3- 22  - likely secondary to dehydration   - mIVF    # HTN   - home regimen is norvasc 10 mg daily and clonidine 0.1 mg q12h   - Continue clonidine patch for now Hydralizine for BP > 180/100  - Try restarting amlodipine 10 mg     # IDDM - uncontrolled - HbA1c 9.1   Blood sugars 200-290  - Continue sliding scale while NPO - increase to high intensity insulin   - When clear liquid diet resumes will start low dose lantus 10 units     # Diabetic Neuropathy   -Restart Gabapentin when tolerating PO       # FEN/GI: clear liquid  #Full Code   # PPX: SCDs,  lovenox    Disposition:     Anticipated discharge needs: None       Subjective     CC: nausea and vomiting     Interval History/24 hour events: Persistent non bilious emesis overnight with too many episodes to count. Has not slept overnight. pain. Denies chest pain, shortness of breath. He is complaining of hiccups. He states the pain is unbearable and he has not had pain relief and that is what triggers his flares. If he can get in under control. He would have no vomiting.         Physical Exam:       VITAL SIGNS PHYSICAL EXAM   Temp:  [96.1 F (35.6 C)-98.8 F (37.1 C)] 96.1 F (35.6 C)  Heart Rate:  [86-114] 107   Resp Rate:  [16-18] 16   BP: (107-171)/(56-100) 160/87 mmHg       No intake or output data in the 24 hours ending 01/14/13 0752 Physical Exam  Gen: lying in bed, no acute distress    HEENT: dry MMM  Cardiac: RRR no  Murmurs   Lungs: Clear to auscultation   Abdomen: soft , + Epigastric pain, no distension, no rebound   Ext: no edema        Meds:     Medications were reviewed.    Labs:       Labs (last 72 hours):      Lab 01/14/13 0353 01/13/13 0446   WBC 9.44 8.52   HGB 12.9* 12.8*   HCT 38.4* 38.9*   PLT 273 301       No results found for this basename: PT:2,INR:2,PTT:2 in the last 168 hours   Lab 01/14/13 0353 01/13/13 0446 01/11/13 2313   NA 140 138 --   K 4.1 4.1 --   CL 104 104 --   CO2 21* 22 --   BUN 14.0 11.0 --   CREAT 1.3 1.3 --   CA 9.0 9.0 --   ALB -- -- 4.3   PROT -- -- 7.7   BILITOTAL -- -- 0.6   ALKPHOS -- -- 114   ALT -- -- 27   AST -- -- 17   GLU 262* 249* --         HbA1c: 9.6   Lipase 63 on admission      Microbiology, reviewed and are significant for:  None       Imaging personally reviewed, including:   AXR: no acute process      Case discussed with: Dr. Anselm Jungling    Signed by: Kristine Linea, MD PGY-2  Pager [phone]  [facility]   [address]   [place], [place] [address]

## 2013-01-14 NOTE — Plan of Care (Signed)
Pt c/o of pain requesting dilaudid, pt continues to vomit . Reminded pt that the narcotics are contributing to the gastroparesis

## 2013-01-14 NOTE — Progress Notes (Signed)
[facility] [place]  PROGRESS NOTE    Date Time: 01/14/2013 7:51 PM  Patient Name: [name]      Chief Complaint:   Gastroparesis, n/v    Assessment and Plan:   Assessment:   1. Acute exacerbation of diabetic gastroparesis.  2. Abdominal pain with N/V, improving, 2/2 #1.   3. GERD   4. Chronic constipation   5. DMM2, insulin-dependant, poorly-controlled.     Plan:   1. Continue Erythromycin standing dose, Reglan as needed. Monitor QT interval.  2. Tight glucose control.   3. Continue PPI - increase to BID. Add Carafate.  4. Continue clear liquid diet. Advance as tolerated to frequent small low fat low residue meals.   5. Avoid narcotics which will slow GI motility further. Consider a trial of Ativan and Benadryl.  6. Outpatient GI f/u with Dr. Jens Som. Consider evaluation at [facility] for Domperidone.   7. Miralax 17 grams BID. Consider Relistor as an outpatient.       Subjective:   Patient continued to have emesis overnight. Nausea precipitated by abdominal pain.    Medications:     Current Facility-Administered Medications   Medication Dose Route Frequency   . cloNIDine  1 patch Transdermal Weekly   . enoxaparin  40 mg Subcutaneous Daily   . erythromycin  100 mg Intravenous Q8H   . insulin glargine  10 Units Subcutaneous QAM   . pantoprazole  40 mg Intravenous Daily   . polyethylene glycol  17 g Oral BID   . promethazine  25 mg Intravenous Q6H   . sucralfate  1 g Oral TID AC & HS   . [DISCONTINUED] metoclopramide  10 mg Intravenous Q6H SCH   . [DISCONTINUED] promethazine  12.5 mg Intravenous Q6H       Review of Systems:   Complete 12 Point ROS negative or normal except those mentioned in HPI above.    Physical Exam:     Filed Vitals:    01/14/13 1723   BP: 184/101   Pulse: 101   Temp: 97.9 F (36.6 C)   Resp: 16   SpO2: 94%       General appearance: Well developed, well nourished, appears stated age and in NAD   Eyes: Sclera anicteric, pink conjunctivae, no ptosis   ENMT: mucous  membranes moist, nose and ears appear normal. Oropharynx clear.   Chest: Non labored respirations, no audible wheezing, no clubbing or cyanosis   CV: Regular rate and rhythm, no JVD, no LE edema   Abdomen: soft, non-distended, no masses or organomegaly, mild TTP in epigastrium, no rebound/guarding   Skin: Normal color and turgor, no rashes, no suspicious skin lesions noted   Neuro: CN II-XII grossly intact. No gross movement disorders noted.   Mental status: Appropriate affect, alert and oriented x 3      Labs:     Recent Labs   [facility] 01/14/13 0353 01/13/13 0446    WBC 9.44 8.52    HGB 12.9* 12.8*    HCT 38.4* 38.9*    PLT 273 301    MCV 79.8* 80.4       Recent Labs   Basename 01/14/13 0353 01/13/13 0446    NA 140 138    K 4.1 4.1    CL 104 104    CO2 21* 22    BUN 14.0 11.0    CREAT 1.3 1.3    GLU 262* 249*    CA 9.0 9.0    MG  2.1 2.0    PHOS -- --       Recent Labs   [facility] 01/11/13 2313    AST 17    ALT 27    ALKPHOS 114    BILITOTAL 0.6    BILIDIRECT --    PROT 7.7    ALB 4.3       No results found for this basename: PTT:2,PT:2,INR:2 in the last 72 hours     Radiology:   Radiological Procedure reviewed:    Endoscopy:   EGD 08/08/12 ([facility]): gastritis

## 2013-01-14 NOTE — Progress Notes (Signed)
------------------------------------------------------------------------------------------------------------------    [facility] Attending Physician Addendum / Note    This patient was seen / examined / discussed with R2 / Dr. Army Melia.  Physical exam duplicated.  I agree with assessment and plan as outlined, with any additions or revisions below.     - trial of Thorazine and Valium  - cont Zofran / Phenergan / Reglan / E-mycin / IVF / PPI  - glycemic control / SSI  - avoid opiates for pn control; favor Tramadol if needed for pn control  - diet as tolerated  - OOB to chair; encouraged to ambulate

## 2013-01-14 NOTE — Progress Notes (Signed)
Pt had small to medium greenish emesis many times through out the night , some relief noted after phenergan  and valium IV administration,  Pt complained sever abdominal pain as well ,dilaudid  IV given once with help. Continued on NPO , ST on tele ,IVF 75 /hr infusing ,will continue to monitor.

## 2013-01-15 LAB — CBC WITH MANUAL DIFFERENTIAL
Atypical Lymphocytes %: 10 %
Atypical Lymphocytes Absolute: 0.9 — ABNORMAL HIGH
Band Neutrophils Absolute: 0 (ref 0.00–1.00)
Band Neutrophils: 0 %
Basophils Absolute Manual: 0.16 (ref 0.00–0.20)
Basophils Manual: 2 %
Cell Morphology: NORMAL
Hematocrit: 38.9 % — ABNORMAL LOW (ref 42.0–52.0)
Hgb: 13.1 g/dL (ref 13.0–17.0)
Lymphocytes Absolute Manual: 0.82 (ref 0.50–4.40)
Lymphocytes Manual: 9 %
MCH: 26.7 pg — ABNORMAL LOW (ref 28.0–32.0)
MCHC: 33.7 g/dL (ref 32.0–36.0)
MCV: 79.2 fL — ABNORMAL LOW (ref 80.0–100.0)
MPV: 9.7 fL (ref 9.4–12.3)
Monocytes Absolute: 0.49 (ref 0.00–1.20)
Monocytes Manual: 5 %
Myelocytes Absolute: 0.08 — ABNORMAL HIGH
Myelocytes: 1 %
Neutrophils Absolute Manual: 6.66 (ref 1.80–8.10)
Nucleated RBC: 0 (ref 0–1)
Platelet Estimate: NORMAL
Platelets: 279 (ref 140–400)
RBC: 4.91 (ref 4.70–6.00)
RDW: 12 % (ref 12–15)
Segmented Neutrophils: 73 %
WBC: 9.13 (ref 3.50–10.80)

## 2013-01-15 LAB — ECG 12-LEAD
Atrial Rate: 108 {beats}/min
P Axis: 59 degrees
P-R Interval: 162 ms
Q-T Interval: 342 ms
QRS Duration: 88 ms
QTC Calculation (Bezet): 458 ms
R Axis: 47 degrees
T Axis: 52 degrees
Ventricular Rate: 108 {beats}/min

## 2013-01-15 LAB — BASIC METABOLIC PANEL
BUN: 12 mg/dL (ref 9.0–21.0)
CO2: 21 mEq/L — ABNORMAL LOW (ref 22–29)
Calcium: 9.1 mg/dL (ref 8.5–10.5)
Chloride: 102 mEq/L (ref 98–107)
Creatinine: 1.2 mg/dL (ref 0.7–1.3)
Glucose: 224 mg/dL — ABNORMAL HIGH (ref 70–100)
Potassium: 4 mEq/L (ref 3.5–5.1)
Sodium: 137 mEq/L (ref 136–145)

## 2013-01-15 LAB — GLUCOSE WHOLE BLOOD - POCT
Whole Blood Glucose POCT: 213 mg/dL — ABNORMAL HIGH (ref 70–100)
Whole Blood Glucose POCT: 179 mg/dL — ABNORMAL HIGH (ref 70–100)
Whole Blood Glucose POCT: 174 mg/dL — ABNORMAL HIGH (ref 70–100)
Whole Blood Glucose POCT: 204 mg/dL — ABNORMAL HIGH (ref 70–100)

## 2013-01-15 LAB — MAGNESIUM: Magnesium: 2 mg/dL (ref 1.6–2.6)

## 2013-01-15 LAB — GFR: EGFR: >60

## 2013-01-15 MED ORDER — LABETALOL HCL 5 MG/ML IV SOLN
10.0000 mg | Freq: Four times a day (QID) | INTRAVENOUS | Status: DC | PRN
Start: 2013-01-15 — End: 2013-01-16
  Administered 2013-01-16: 10 mg via INTRAVENOUS
  Filled 2013-01-15 (×4): qty 2

## 2013-01-15 MED ORDER — INSULIN GLARGINE 100 UNIT/ML SC SOLN
12.0000 [IU] | Freq: Every morning | SUBCUTANEOUS | Status: DC
Start: 2013-01-16 — End: 2013-01-16

## 2013-01-15 MED ORDER — PANTOPRAZOLE SODIUM 40 MG IV SOLR
40.0000 mg | Freq: Two times a day (BID) | INTRAVENOUS | Status: DC
Start: 2013-01-15 — End: 2013-01-17
  Administered 2013-01-15 – 2013-01-16 (×3): 40 mg via INTRAVENOUS
  Filled 2013-01-15 (×3): qty 40

## 2013-01-15 MED ORDER — DIAZEPAM 5 MG/ML IJ SOLN
5.0000 mg | Freq: Three times a day (TID) | INTRAMUSCULAR | Status: DC
Start: 2013-01-15 — End: 2013-01-19
  Administered 2013-01-15 – 2013-01-19 (×13): 5 mg via INTRAVENOUS
  Filled 2013-01-15 (×13): qty 2

## 2013-01-15 MED ORDER — AMLODIPINE BESYLATE 5 MG PO TABS
10.0000 mg | ORAL_TABLET | Freq: Every day | ORAL | Status: DC
Start: 2013-01-15 — End: 2013-01-19
  Administered 2013-01-15 – 2013-01-19 (×5): 10 mg via ORAL
  Filled 2013-01-15 (×6): qty 2

## 2013-01-15 NOTE — Plan of Care (Signed)
Patient A&Ox3. VSS.  SR on telemetry monitor. RM saturation >95%. Patient reports abdominal pain and nausea throughout shift, and  was provided PRN pain medication with mild relief. Patient ambulates to restroom and asked to void into measuring receptacle but is not compliant. NS infusing as per EMR. Fall and safety precautions maintained, call bell within reach. Will continue to monitor.

## 2013-01-15 NOTE — Progress Notes (Signed)
[facility] [place]  PROGRESS NOTE    Date Time: 01/15/2013 3:49 PM  Patient Name: [name]      Chief Complaint:   Gastroparesis, n/v    Assessment and Plan:   Assessment:   1. Acute exacerbation of diabetic gastroparesis.  2. Abdominal pain with N/V, improving, 2/2 #1.   3. GERD   4. Chronic constipation   5. DMM2, insulin-dependant, poorly-controlled.     Plan:   1. Continue Erythromycin standing dose, Reglan as needed. Monitor QT interval.  2. Tight glucose control.   3. Continue PPI BID, Carafate.  4. Continue clear liquid diet. Advance as tolerated to frequent small low fat low residue meals.   5. Avoid narcotics which will slow GI motility further. Consider a trial of Ativan and Benadryl.  6. Outpatient GI f/u with Dr. Jens Som. Consider evaluation at [facility] for Domperidone.   7. Miralax 17 grams BID. Consider Relistor as an outpatient.       Subjective:   +n/v, abd pain overnight. Not improved.    Medications:     Current Facility-Administered Medications   Medication Dose Route Frequency   . amLODIPine  10 mg Oral Daily   . cloNIDine  1 patch Transdermal Weekly   . diazepam  5 mg Intravenous Q8H   . enoxaparin  40 mg Subcutaneous Daily   . erythromycin  100 mg Intravenous Q8H   . [START ON 01/16/2013] insulin glargine  12 Units Subcutaneous QAM   . pantoprazole  40 mg Intravenous BID   . polyethylene glycol  17 g Oral BID   . promethazine  25 mg Intravenous Q6H   . sucralfate  1 g Oral TID AC & HS   . [DISCONTINUED] insulin glargine  10 Units Subcutaneous QAM   . [DISCONTINUED] pantoprazole  40 mg Intravenous Daily       Review of Systems:   Complete 12 Point ROS negative or normal except those mentioned in HPI above.    Physical Exam:     Filed Vitals:    01/15/13 1508   BP: 179/93   Pulse: 98   Temp: 97.3 F (36.3 C)   Resp: 18   SpO2: 95%       General appearance: Well developed, well nourished, appears stated age and in NAD   Eyes: Sclera anicteric, pink conjunctivae,  no ptosis   ENMT: mucous membranes moist, nose and ears appear normal. Oropharynx clear.   Chest: Non labored respirations, no audible wheezing, no clubbing or cyanosis   CV: Regular rate and rhythm, no JVD, no LE edema   Abdomen: soft, non-distended, no masses or organomegaly, mild TTP in epigastrium, no rebound/guarding   Skin: Normal color and turgor, no rashes, no suspicious skin lesions noted   Neuro: CN II-XII grossly intact. No gross movement disorders noted.   Mental status: Appropriate affect, alert and oriented x 3      Labs:     Recent Labs   [facility] 01/15/13 0511 01/14/13 0353    WBC 9.13 9.44    HGB 13.1 12.9*    HCT 38.9* 38.4*    PLT 279 273    MCV 79.2* 79.8*       Recent Labs   Basename 01/15/13 0511 01/14/13 0353    NA 137 140    K 4.0 4.1    CL 102 104    CO2 21* 21*    BUN 12.0 14.0    CREAT 1.2 1.3    GLU  224* 262*    CA 9.1 9.0    MG 2.0 2.1    PHOS -- --       No results found for this basename: AST:2,ALT:2,ALKPHOS:2,BILITOTAL:2,BILIDIRECT:2,PROT:2,ALB:2 in the last 72 hours    No results found for this basename: PTT:2,PT:2,INR:2 in the last 72 hours     Radiology:   Radiological Procedure reviewed:    Endoscopy:   EGD 08/08/12 ([facility]): gastritis      Janine Limbo. Betsey Holiday, MD  Resident, General Surgery  [phone]

## 2013-01-15 NOTE — Progress Notes (Signed)
FAMILY MEDICINE PROGRESS NOTE    Date Time: 01/15/2013 6:39 AM  Patient Name: [name]  Attending Physician: Jillene Bucks, MD  Team Contact Info: Spect[facility] [phone] 8 am -5 pm: After hours [phone]      Assessment:     Active Hospital Problems    Diagnosis   . Diabetic gastroparesis   . HTN (hypertension)   . Nausea & vomiting   . Type 2 diabetes, uncontrolled, with neuropathy   . Gastroparesis       37 yo male with h/o T1DM with peripheral neuropathy and gastroparesis, HTN presents with intractable nausea vomiting likely 2/2 diabetic gastroparesis, with hypertension    Plan:   # Nausea and vomiting 2/2 diabetic gastroparesis   - try to limit narcotics as it will slow gastric emptying but will try and treat pain as he often gets cyclic vomiting from worsening pain   - QT 442 - will monitor while on several QT prolonging agents - Tele Repeat EKG 12/3 QT 464  - mIVF   - BMP daily   - Appreciate GI recommendations- Pt refused miralax, continue carafate    - Will recommend follow up with [facility] for domperidone   -  Phenergan Scheduled for nausea, Benadryl PRN for nausea   - Reglan and Erythromycin for gastric motility   - Increased PPI to BID, Carafate   - advance diet as tolerated   - Recheck EKG to evaluate QT     # Hiccups   - s/p thorazine IV given persistent vomiting which did help  - IV doses cannot be repeated per pharmacy so will transition to PO  - Monitor QT as patient is on several QT prolonging medications     # Metabolic Acidosis - improving HCO3- 21  - likely secondary to dehydration   - mIVF    # HTN   - home regimen is norvasc 10 mg daily and clonidine 0.1 mg q12h   - Continue clonidine patch for now Hydralizine for BP > 180/100  - Try restarting amlodipine 10 mg     # IDDM - uncontrolled - HbA1c 9.6   Blood sugars 200-290  - Continue sliding scale while NPO - increase to high intensity insulin   - Continue low dose lantus 10 units     # Diabetic Neuropathy   -Restart Gabapentin when  tolerating PO       # FEN/GI: clear liquid  #Full Code   # PPX: SCDs, lovenox    Disposition:     Anticipated discharge needs: None       Subjective     CC: nausea and vomiting     Interval History/24 hour events: Patient reports minimal improvement but does notice some improvement. Had 1 bowel movement yesterday. Still will persistent vomiting and epigastric pain       Physical Exam:       VITAL SIGNS PHYSICAL EXAM   Temp:  [97.9 F (36.6 C)-99.5 F (37.5 C)] 98.8 F (37.1 C)  Heart Rate:  [86-107] 107   Resp Rate:  [16-18] 18   BP: (156-210)/(76-116) 156/86 mmHg       No intake or output data in the 24 hours ending 01/15/13 9604 Physical Exam  Gen: lying in bed, no acute distress    HEENT: dry MMM  Cardiac: RRR no Murmurs   Lungs: Clear to auscultation   Abdomen: soft , + Epigastric pain, no distension, no rebound   Ext: no edema  Meds:     Medications were reviewed.    Labs:       Labs (last 72 hours):      Lab 01/15/13 0511 01/14/13 0353   WBC 9.13 9.44   HGB 13.1 12.9*   HCT 38.9* 38.4*   PLT 279 273       No results found for this basename: PT:2,INR:2,PTT:2 in the last 168 hours   Lab 01/15/13 0511 01/14/13 0353 01/11/13 2313   NA 137 140 --   K 4.0 4.1 --   CL 102 104 --   CO2 21* 21* --   BUN 12.0 14.0 --   CREAT 1.2 1.3 --   CA 9.1 9.0 --   ALB -- -- 4.3   PROT -- -- 7.7   BILITOTAL -- -- 0.6   ALKPHOS -- -- 114   ALT -- -- 27   AST -- -- 17   GLU 224* 262* --         HbA1c: 9.6   Lipase 63 on admission      Microbiology, reviewed and are significant for:  None       Imaging personally reviewed, including:   AXR: no acute process      Case discussed with: Dr. Rowland Lathe     Signed by: Kristine Linea, MD PGY-2  Pager [phone]  [facility]   [address]   [place], [place] [address]

## 2013-01-15 NOTE — Progress Notes (Signed)
Mark Dispo: Home, Diabetes Clinic  Expected [place] date: TBD    Pt new to [facility]. Reviewed history and clinicals. Notified CCM that pt would be a good diabetes clinic candidate and to request an order.     Will continue to follow for [place] needs.     Owais Pruett E. Dekisha Mesmer, MSW  [facility]  [facility] Case Manager  [phone]

## 2013-01-15 NOTE — Progress Notes (Signed)
[facility] Attending Note    This patient was evaluated on rounds with Dr. Army Melia.  Physical exam directly observed and duplicated.  Labs, radiology, and cultures reviewed.  I agree with assessment and plan as outlined.      Assessment: 37 y/o M with h/o GERD/gastritis, poorly controlled DMII with known diabetic gastroparesis and HTN p/w with likely gastroparesis exacerbation    Patient Active Problem List   Diagnosis   . GERD (gastroesophageal reflux disease)   . Gastroparesis   . Dehydration, moderate   . Gastritis without bleeding   . Type 2 diabetes, uncontrolled, with neuropathy   . Hypertension   . Nausea & vomiting   . Intractable vomiting   . HTN (hypertension)   . Diabetic gastroparesis   . Intractable nausea and vomiting     Plan:  - pain management: limit dilaudid use - will attempt to space (miralax to prevent constipation), will discuss with GI if trial of Toradol appropriate in the setting of h/o gastritis; will change diazepam to scheduled and continue PRN benadryl  - antiemetics: scheduled phenergan, PRN thorazine  - chronic gastroparesis management: continue reglan PRN, erythromycin scheduled; clonidine also helpful as well as PRN thorazine  - increase IVF to maintenance rate  - will increase Lantus dosing for better blood sugar control  - reevaluate EKG (borderline prolonged QT)  - d/c hydralazine and change to PRN labetolol (elevated blood pressure moreso because of pain)  - gastritis: continue protonix - increase to BID per GI recs and add carafate  - appreciate GI recs - domperidone as outpatient if not improvement in control  - diet: clears - ADAT  Dispo: awaiting improvement in pain and PO intake    Kathaleen Bury, MD  [address]  [place], [place] [address]  Office Phone: [phone]  [facility] Resident Spectralink: [phone]  Einar Gip Resident Spectralink: [phone]

## 2013-01-16 LAB — CBC WITH MANUAL DIFFERENTIAL
Atypical Lymphocytes %: 6 %
Atypical Lymphocytes Absolute: 0.5 — ABNORMAL HIGH
Band Neutrophils Absolute: 0 (ref 0.00–1.00)
Band Neutrophils: 0 %
Basophils Absolute Manual: 0 (ref 0.00–0.20)
Basophils Manual: 0 %
Cell Morphology: NORMAL
Eosinophils Absolute Manual: 0 (ref 0.00–0.70)
Eosinophils Manual: 0 %
Hematocrit: 40 % — ABNORMAL LOW (ref 42.0–52.0)
Hgb: 13.6 g/dL (ref 13.0–17.0)
Lymphocytes Absolute Manual: 1.33 (ref 0.50–4.40)
Lymphocytes Manual: 16 %
MCH: 26.9 pg — ABNORMAL LOW (ref 28.0–32.0)
MCHC: 34 g/dL (ref 32.0–36.0)
MCV: 79.1 fL — ABNORMAL LOW (ref 80.0–100.0)
MPV: 9.5 fL (ref 9.4–12.3)
Monocytes Absolute: 0.5 (ref 0.00–1.20)
Monocytes Manual: 6 %
Neutrophils Absolute Manual: 5.98 (ref 1.80–8.10)
Nucleated RBC: 0 (ref 0–1)
Platelet Estimate: NORMAL
Platelets: 293 (ref 140–400)
RBC: 5.06 (ref 4.70–6.00)
RDW: 12 % (ref 12–15)
Segmented Neutrophils: 72 %
WBC: 8.3 (ref 3.50–10.80)

## 2013-01-16 LAB — GFR: EGFR: >60

## 2013-01-16 LAB — BASIC METABOLIC PANEL
BUN: 12 mg/dL (ref 9.0–21.0)
CO2: 24 mEq/L (ref 22–29)
Calcium: 9 mg/dL (ref 8.5–10.5)
Chloride: 102 mEq/L (ref 98–107)
Creatinine: 1.1 mg/dL (ref 0.7–1.3)
Glucose: 211 mg/dL — ABNORMAL HIGH (ref 70–100)
Potassium: 3.6 mEq/L (ref 3.5–5.1)
Sodium: 138 mEq/L (ref 136–145)

## 2013-01-16 LAB — GLUCOSE WHOLE BLOOD - POCT
Whole Blood Glucose POCT: 193 mg/dL — ABNORMAL HIGH (ref 70–100)
Whole Blood Glucose POCT: 205 mg/dL — ABNORMAL HIGH (ref 70–100)
Whole Blood Glucose POCT: 221 mg/dL — ABNORMAL HIGH (ref 70–100)
Whole Blood Glucose POCT: 265 mg/dL — ABNORMAL HIGH (ref 70–100)

## 2013-01-16 MED ORDER — INSULIN GLARGINE 100 UNIT/ML SC SOLN
15.0000 [IU] | Freq: Every morning | SUBCUTANEOUS | Status: DC
Start: 2013-01-16 — End: 2013-01-17
  Administered 2013-01-16: 15 [IU] via SUBCUTANEOUS
  Filled 2013-01-16: qty 150

## 2013-01-16 MED ORDER — METOPROLOL TARTRATE 25 MG PO TABS
25.0000 mg | ORAL_TABLET | Freq: Two times a day (BID) | ORAL | Status: DC
Start: 2013-01-16 — End: 2013-01-19
  Administered 2013-01-16 – 2013-01-19 (×7): 25 mg via ORAL
  Filled 2013-01-16 (×7): qty 1

## 2013-01-16 NOTE — Plan of Care (Signed)
Patient tolerating clear liquids in afternoon and evening with less discomfort in abdomen.  SR on monitor.  Patient resting most of the day with infrequent walks to bathroom.  Will continue to monitor BG and treat as needed with PRN sliding scale coverage.

## 2013-01-16 NOTE — Progress Notes (Signed)
------------------------------------------------------------------------------------------------------------------    [facility] Attending Physician Addendum / Note    This patient was seen / examined / discussed with R2 / Dr. Army Melia.  Physical exam duplicated.  I agree with assessment and plan as outlined, with any additions or revisions below.     - added Ativan / Benadryl per GI recs  - cont Zofran / Phenergan / Reglan / E-mycin / IVF / PPI  - glycemic control / Lantus / SSI  - avoid opiates for pn control; favor Tramadol if needed for pn control  - diet as tolerated  - OOB to chair; encouraged to ambulate

## 2013-01-16 NOTE — Progress Notes (Addendum)
[facility] [place]  PROGRESS NOTE    Date Time: 01/16/2013 9:13 AM  Patient Name: [name]      Chief Complaint:   Gastroparesis, n/v    Assessment and Plan:   Assessment:   1. Acute exacerbation of diabetic gastroparesis, now improved  2. Abdominal pain with N/V, improving, 2/2 #1.   3. GERD   4. Chronic constipation   5. DMM2, insulin-dependant, poorly-controlled.     Plan:   1. Continue Erythromycin standing dose. May stop Reglan.  2. Tight glucose control.   3. Continue PPI BID, Carafate.  4. Advance as tolerated to frequent small low fat low residue meals.   5. Avoid narcotics which will slow GI motility further. Consider a trial of Ativan and Benadryl.  6. Outpatient GI f/u with Dr. Jens Som.   7. Miralax 17 grams BID. Consider Relistor as an outpatient.   8. Will see the patient again on Monday.  If there are urgent issues over the weekend, please call the GI PA at [phone].      Subjective:   Continued nausea, emesis, abd pain. Glucoses ranged 174-213 in past 24hrs.    Medications:     Current Facility-Administered Medications   Medication Dose Route Frequency   . amLODIPine  10 mg Oral Daily   . cloNIDine  1 patch Transdermal Weekly   . diazepam  5 mg Intravenous Q8H   . enoxaparin  40 mg Subcutaneous Daily   . erythromycin  100 mg Intravenous Q8H   . insulin glargine  15 Units Subcutaneous QAM   . pantoprazole  40 mg Intravenous BID   . polyethylene glycol  17 g Oral BID   . promethazine  25 mg Intravenous Q6H   . sucralfate  1 g Oral TID AC & HS   . [DISCONTINUED] insulin glargine  10 Units Subcutaneous QAM   . [DISCONTINUED] insulin glargine  12 Units Subcutaneous QAM   . [DISCONTINUED] pantoprazole  40 mg Intravenous Daily       Review of Systems:   Complete 12 Point ROS negative or normal except those mentioned in HPI above.    Physical Exam:     Filed Vitals:    01/16/13 0734   BP: 174/90   Pulse:    Temp:    Resp:    SpO2:        General appearance: Well developed,  well nourished, appears stated age and in NAD   Eyes: Sclera anicteric, pink conjunctivae, no ptosis   ENMT: mucous membranes moist, nose and ears appear normal. Oropharynx clear.   Chest: Non labored respirations, no audible wheezing, no clubbing or cyanosis   CV: Regular rate and rhythm, no JVD, no LE edema   Abdomen: soft, non-distended, no masses or organomegaly, mild TTP in epigastrium, no rebound/guarding   Skin: Normal color and turgor, no rashes, no suspicious skin lesions noted   Neuro: CN II-XII grossly intact. No gross movement disorders noted.   Mental status: Appropriate affect, alert and oriented x 3      Labs:     Recent Labs   [facility] 01/16/13 0703 01/15/13 0511    WBC 8.30 9.13    HGB 13.6 13.1    HCT 40.0* 38.9*    PLT 293 279    MCV 79.1* 79.2*       Recent Labs   Basename 01/16/13 0703 01/15/13 0511 01/14/13 0353    NA 138 137 --    K 3.6 4.0 --  CL 102 102 --    CO2 24 21* --    BUN 12.0 12.0 --    CREAT 1.1 1.2 --    GLU 211* 224* --    CA 9.0 9.1 --    MG -- 2.0 2.1    PHOS -- -- --       No results found for this basename: AST:2,ALT:2,ALKPHOS:2,BILITOTAL:2,BILIDIRECT:2,PROT:2,ALB:2 in the last 72 hours    No results found for this basename: PTT:2,PT:2,INR:2 in the last 72 hours     Radiology:   Radiological Procedure reviewed:    Endoscopy:   EGD 08/08/12 ([facility]): gastritis      Janine Limbo. Betsey Holiday, MD  Resident, General Surgery  [phone]  Attending:  A resident contributed to the patient care.  I reviewed the history.  I personally examined the patient, including GI, and reviewed the remainder of the examination.  I agree with the impression and treatment plan.

## 2013-01-16 NOTE — Progress Notes (Signed)
FAMILY MEDICINE PROGRESS NOTE    Date Time: 01/16/2013 7:48 AM  Patient Name: [name]  Attending Physician: Jillene Bucks, MD  Team Contact Info: [employer] [phone] 8 am -5 pm: After hours [phone]      Assessment:     Active Hospital Problems    Diagnosis   . Diabetic gastroparesis   . HTN (hypertension)   . Nausea & vomiting   . Type 2 diabetes, uncontrolled, with neuropathy   . Gastroparesis       37 yo male with h/o T1DM with peripheral neuropathy and gastroparesis, HTN presents with intractable nausea vomiting likely 2/2 diabetic gastroparesis, with hypertension    Plan:   # Nausea and vomiting 2/2 diabetic gastroparesis   - try to limit narcotics as it will slow gastric emptying but will try and treat pain as he often gets cyclic vomiting from worsening pain   - QT 442 - will monitor while on several QT prolonging agents - Tele,  Repeat EKG 12/4 QT 458 so QT stable   - mIVF   - BMP daily   - Appreciate GI recommendations-miralax   - continue carafate , Protonix IV BID - will attempt to transition to oral medications as tolerated    - Will recommend follow up with [facility] for domperidone   - Phenergan Scheduled for nausea, Benadryl PRN for nausea   - Reglan and Erythromycin for gastric motility   - Increased PPI to BID, Carafate   - advance diet as tolerated     # Hiccups - resolved   - s/p thorazine IV given persistent vomiting which did help  - IV doses cannot be repeated per pharmacy so will transition to PO  - Monitor QT as patient is on several QT prolonging medications     # Metabolic Acidosis - improving HCO3- 24  - likely secondary to dehydration   - mIVF    # HTN   - home regimen is norvasc 10 mg daily and clonidine 0.1 mg q12h   - Continue clonidine patch for now, Amlodipine 10 mg  - Start beta blocker for HTN to assist with improved BP control     # IDDM - uncontrolled - HbA1c 9.6   Blood sugars 200-290  - Continue sliding scale while NPO - increase to high intensity insulin   - Continue  low dose lantus, increase to 15 units     # Diabetic Neuropathy   -Restart Gabapentin when tolerating PO     # FEN/GI: clear liquid  #Full Code   # PPX: SCDs, lovenox    Disposition:     Anticipated discharge needs: None       Subjective     CC: nausea and vomiting     Interval History/24 hour events:    Patient reports he has minimal improvement but noticeable. Still with persistent epigastric pain. He had 1 BM yesterday. Still with frequent small volume emesis. Unable to tolerate significant clear liquid diet yet.     Physical Exam:       VITAL SIGNS PHYSICAL EXAM   Temp:  [96.6 F (35.9 C)-98.8 F (37.1 C)] 96.6 F (35.9 C)  Heart Rate:  [91-110] 110   Resp Rate:  [18-20] 18   BP: (163-181)/(93-100) 181/98 mmHg         Intake/Output Summary (Last 24 hours) at 01/16/13 0748  Last data filed at 01/16/13 0100   Gross per 24 hour   Intake    400 ml  Output   1550 ml   Net  -1150 ml    Physical Exam  Gen: lying in bed, no acute distress    HEENT: dry MMM  Cardiac: RRR no Murmurs   Lungs: Clear to auscultation   Abdomen: soft , + Epigastric pain, no distension, no rebound   Ext: no edema        Meds:     Medications were reviewed.    Labs:       Labs (last 72 hours):      Lab 01/16/13 0703 01/15/13 0511   WBC 8.30 9.13   HGB 13.6 13.1   HCT 40.0* 38.9*   PLT 293 279       No results found for this basename: PT:2,INR:2,PTT:2 in the last 168 hours   Lab 01/16/13 0703 01/15/13 0511 01/11/13 2313   NA 138 137 --   K 3.6 4.0 --   CL 102 102 --   CO2 24 21* --   BUN 12.0 12.0 --   CREAT 1.1 1.2 --   CA 9.0 9.1 --   ALB -- -- 4.3   PROT -- -- 7.7   BILITOTAL -- -- 0.6   ALKPHOS -- -- 114   ALT -- -- 27   AST -- -- 17   GLU 211* 224* --         HbA1c: 9.6   Lipase 63 on admission      Microbiology, reviewed and are significant for:  None       Imaging personally reviewed, including:   AXR: no acute process      Case discussed with: Dr. Anselm Jungling    Signed by: Kristine Linea, MD PGY-2  Pager [phone]  [facility]    [address]   [place], [place] [address]

## 2013-01-17 ENCOUNTER — Inpatient Hospital Stay: Payer: BC Managed Care – PPO

## 2013-01-17 LAB — GLUCOSE WHOLE BLOOD - POCT
Whole Blood Glucose POCT: 188 mg/dL — ABNORMAL HIGH (ref 70–100)
Whole Blood Glucose POCT: 201 mg/dL — ABNORMAL HIGH (ref 70–100)
Whole Blood Glucose POCT: 202 mg/dL — ABNORMAL HIGH (ref 70–100)
Whole Blood Glucose POCT: 200 mg/dL — ABNORMAL HIGH (ref 70–100)

## 2013-01-17 MED ORDER — IOHEXOL 350 MG/ML IV SOLN
100.0000 mL | Freq: Once | INTRAVENOUS | Status: DC | PRN
Start: 2013-01-17 — End: 2013-01-18

## 2013-01-17 MED ORDER — HYDROMORPHONE HCL PF 1 MG/ML IJ SOLN
0.4000 mg | Freq: Once | INTRAMUSCULAR | Status: AC
Start: 2013-01-17 — End: 2013-01-17
  Administered 2013-01-17: 0.4 mg via INTRAVENOUS
  Filled 2013-01-17: qty 1

## 2013-01-17 MED ORDER — PANTOPRAZOLE SODIUM 40 MG PO TBEC
40.0000 mg | DELAYED_RELEASE_TABLET | Freq: Two times a day (BID) | ORAL | Status: DC
Start: 2013-01-17 — End: 2013-01-19
  Administered 2013-01-17 – 2013-01-19 (×5): 40 mg via ORAL
  Filled 2013-01-17 (×5): qty 1

## 2013-01-17 MED ORDER — INSULIN GLARGINE 100 UNIT/ML SC SOLN
20.0000 [IU] | Freq: Every morning | SUBCUTANEOUS | Status: DC
Start: 2013-01-17 — End: 2013-01-18
  Administered 2013-01-17: 20 [IU] via SUBCUTANEOUS

## 2013-01-17 MED ORDER — LISINOPRIL 20 MG PO TABS
20.0000 mg | ORAL_TABLET | Freq: Every day | ORAL | Status: DC
Start: 2013-01-17 — End: 2013-01-18
  Administered 2013-01-17: 20 mg via ORAL
  Filled 2013-01-17: qty 1

## 2013-01-17 MED ORDER — IOHEXOL 240 MG/ML IJ SOLN
50.0000 mL | Freq: Once | INTRAMUSCULAR | Status: AC | PRN
Start: 2013-01-17 — End: 2013-01-18
  Administered 2013-01-18: 50 mL via ORAL

## 2013-01-17 NOTE — Progress Notes (Signed)
FAMILY MEDICINE PROGRESS NOTE    Date Time: 01/17/2013 7:51 AM  Patient Name: [name]  Attending Physician: Jillene Bucks, MD  Team Contact Info: [employer] [phone] 8 am -5 pm: After hours [phone]      Assessment:     Active Hospital Problems    Diagnosis   . Diabetic gastroparesis   . HTN (hypertension)   . Nausea & vomiting   . Type 2 diabetes, uncontrolled, with neuropathy   . Gastroparesis       37 yo male with h/o T1DM with peripheral neuropathy and gastroparesis, HTN presents with intractable nausea vomiting likely 2/2 diabetic gastroparesis, with hypertension    Plan:   # Nausea and vomiting 2/2 diabetic gastroparesis   - try to limit narcotics as it will slow gastric emptying but will try and treat pain as he often gets cyclic vomiting from worsening pain   - QT 442 - will monitor while on several QT prolonging agents - Tele,  Repeat EKG 12/4 QT 458 so QT stable   - mIVF   - BMP daily   - Appreciate GI recommendations-miralax   - continue carafate , Protonix IV BID - will attempt to transition to oral medications as tolerated    - Will recommend follow up with [facility] for domperidone   - CT scan to evalauate for obstruction with IV and PO contrast- if need to would place NGT for administration of contrast and leave in place until after CT scan. Plan to place NGT to LCWS after procedure to assist with gastroparesis  - Phenergan Scheduled for nausea, Benadryl PRN for nausea   - Erythromycin for gastric motility , will follow GI recommendations to stop reglan   - Transition to PPI oral, Carafate   - Advance diet as tolerated     # Hiccups - resolved   - s/p thorazine IV given persistent vomiting which did help  - IV doses cannot be repeated per pharmacy so will transition to PO  - Monitor QT as patient is on several QT prolonging medications     # HTN   - home regimen is norvasc 10 mg daily and clonidine 0.1 mg q12h   - Continue clonidine patch for now 0.1mg /24 hr, Amlodipine 10 mg, restart home  lisinopril 20 mg   - Continue metoprolol 25 mg BID    # IDDM - uncontrolled - HbA1c 9.6   Blood sugars 205-265  - Continue high intensity sliding scale  - Continue low dose lantus, increase to 20 units     # Diabetic Neuropathy   -Restart Gabapentin when tolerating PO     # FEN/GI: clear liquid  #Full Code   # PPX: SCDs, lovenox    Disposition:     Anticipated discharge needs: None       Subjective     CC: nausea and vomiting     Interval History/24 hour events: Increased nausea overnight with worsening pain. Denies Chest pain, Shortness of breath.     Physical Exam:       VITAL SIGNS PHYSICAL EXAM   Temp:  [97.2 F (36.2 C)-98.8 F (37.1 C)] 98.2 F (36.8 C)  Heart Rate:  [90-114] 104   Resp Rate:  [17-18] 18   BP: (128-190)/(72-113) 138/88 mmHg         Intake/Output Summary (Last 24 hours) at 01/17/13 0751  Last data filed at 01/17/13 0600   Gross per 24 hour   Intake      0  ml   Output   1200 ml   Net  -1200 ml    Physical Exam  Gen: lying in bed, no acute distress    HEENT: dry MMM  Cardiac: RRR no Murmurs   Lungs: Clear to auscultation   Abdomen: soft , + Epigastric pain, no distension, no rebound   Ext: no edema        Meds:     Medications were reviewed.    Labs:       Labs (last 72 hours):      Lab 01/16/13 0703 01/15/13 0511   WBC 8.30 9.13   HGB 13.6 13.1   HCT 40.0* 38.9*   PLT 293 279       No results found for this basename: PT:2,INR:2,PTT:2 in the last 168 hours   Lab 01/16/13 0703 01/15/13 0511 01/11/13 2313   NA 138 137 --   K 3.6 4.0 --   CL 102 102 --   CO2 24 21* --   BUN 12.0 12.0 --   CREAT 1.1 1.2 --   CA 9.0 9.1 --   ALB -- -- 4.3   PROT -- -- 7.7   BILITOTAL -- -- 0.6   ALKPHOS -- -- 114   ALT -- -- 27   AST -- -- 17   GLU 211* 224* --         HbA1c: 9.6   Lipase 63 on admission      Microbiology, reviewed and are significant for:  None       Imaging personally reviewed, including:   AXR: no acute process    Case discussed with: Dr. Anselm Jungling    Signed by: Kristine Linea, MD PGY-2  Pager  [phone]  [facility]   [address]   [place], [place] [address]

## 2013-01-17 NOTE — Plan of Care (Signed)
Problem: Nausea/Vomitting  Goal: Fluid and electrolyte balance are achieved/maintained  Outcome: Progressing  Intervention: Monitor/assess lab values and report abnormal values.  Patient A&Ox3. Afebrile. SR/ST on telemetry. RA saturation >95%. BP can at times be high. Having nausea and emesis X 6- small to medium greenish/yellowish- during day shift. Patient has phenergan scheduled. Patient is ambulatory to the bathroom. Call light within reach. Will continue to monitor patient closely and notify MD of any changes in status.     CT of ABD was not done due to IV not working. Patient a very hard stick-unable to start IV line on the floor or at CT scan. MD was notified- an order was placed for double lumen PICC line as per MD.     Attempted to place an NG tube but patient did not tolerate procedure and want it NG tube out as it was being inserted. MD was notified.

## 2013-01-17 NOTE — Progress Notes (Signed)
------------------------------------------------------------------------------------------------------------------    [facility] Attending Physician Addendum / Note    This patient was seen / examined / discussed with R2 / Dr. Army Melia.  Physical exam duplicated.  I agree with assessment and plan as outlined, with any additions or revisions below.     - abd CT  - cont Zofran / Phenergan / E-mycin / Ativan / Benadryl / IVF / PPI  - glycemic control / Lantus / SSI  - avoid opiates for pn control; favor Tramadol if needed for pn control  - OOB to chair; encouraged to ambulate  - ADAT  - dispo per GI recs

## 2013-01-18 ENCOUNTER — Inpatient Hospital Stay: Payer: BC Managed Care – PPO

## 2013-01-18 LAB — BASIC METABOLIC PANEL
BUN: 11 mg/dL (ref 9.0–21.0)
CO2: 24 mEq/L (ref 22–29)
Calcium: 8.9 mg/dL (ref 8.5–10.5)
Chloride: 105 mEq/L (ref 98–107)
Creatinine: 1.1 mg/dL (ref 0.7–1.3)
Glucose: 178 mg/dL — ABNORMAL HIGH (ref 70–100)
Potassium: 3.6 mEq/L (ref 3.5–5.1)
Sodium: 139 mEq/L (ref 136–145)

## 2013-01-18 LAB — CBC WITH MANUAL DIFFERENTIAL
Atypical Lymphocytes %: 22 %
Atypical Lymphocytes Absolute: 1.91 — ABNORMAL HIGH
Band Neutrophils Absolute: 0 (ref 0.00–1.00)
Band Neutrophils: 0 %
Cell Morphology: NORMAL
Eosinophils Absolute Manual: 0.08 (ref 0.00–0.70)
Eosinophils Manual: 1 %
Hematocrit: 40.2 % — ABNORMAL LOW (ref 42.0–52.0)
Hgb: 13.9 g/dL (ref 13.0–17.0)
Lymphocytes Absolute Manual: 1.43 (ref 0.50–4.40)
Lymphocytes Manual: 16 %
MCH: 27.7 pg — ABNORMAL LOW (ref 28.0–32.0)
MCHC: 34.6 g/dL (ref 32.0–36.0)
MCV: 80.1 fL (ref 80.0–100.0)
MPV: 9.3 fL — ABNORMAL LOW (ref 9.4–12.3)
Monocytes Absolute: 0.56 (ref 0.00–1.20)
Monocytes Manual: 6 %
Neutrophils Absolute Manual: 4.86 (ref 1.80–8.10)
Nucleated RBC: 0 (ref 0–1)
Platelet Estimate: NORMAL
Platelets: 273 (ref 140–400)
RBC: 5.02 (ref 4.70–6.00)
RDW: 12 % (ref 12–15)
Segmented Neutrophils: 55 %
WBC: 8.84 (ref 3.50–10.80)

## 2013-01-18 LAB — GLUCOSE WHOLE BLOOD - POCT
Whole Blood Glucose POCT: 159 mg/dL — ABNORMAL HIGH (ref 70–100)
Whole Blood Glucose POCT: 177 mg/dL — ABNORMAL HIGH (ref 70–100)
Whole Blood Glucose POCT: 187 mg/dL — ABNORMAL HIGH (ref 70–100)
Whole Blood Glucose POCT: 167 mg/dL — ABNORMAL HIGH (ref 70–100)

## 2013-01-18 LAB — MAGNESIUM: Magnesium: 1.9 mg/dL (ref 1.6–2.6)

## 2013-01-18 LAB — GFR: EGFR: >60

## 2013-01-18 MED ORDER — IOHEXOL 240 MG/ML IJ SOLN
50.0000 mL | Freq: Once | INTRAMUSCULAR | Status: DC | PRN
Start: 2013-01-18 — End: 2013-01-18

## 2013-01-18 MED ORDER — INSULIN GLARGINE 100 UNIT/ML SC SOLN
22.0000 [IU] | Freq: Every morning | SUBCUTANEOUS | Status: DC
Start: 2013-01-18 — End: 2013-01-19
  Administered 2013-01-18 – 2013-01-19 (×2): 22 [IU] via SUBCUTANEOUS
  Filled 2013-01-18 (×2): qty 220

## 2013-01-18 MED ORDER — ONDANSETRON HCL 4 MG/2ML IJ SOLN
4.0000 mg | Freq: Once | INTRAMUSCULAR | Status: AC | PRN
Start: 2013-01-18 — End: 2013-01-18
  Administered 2013-01-18: 4 mg via INTRAVENOUS
  Filled 2013-01-18: qty 2

## 2013-01-18 MED ORDER — IOHEXOL 350 MG/ML IV SOLN
100.0000 mL | Freq: Once | INTRAVENOUS | Status: AC | PRN
Start: 2013-01-18 — End: 2013-01-18
  Administered 2013-01-18: 100 mL via INTRAVENOUS

## 2013-01-18 MED ORDER — MAGNESIUM SULFATE IN D5W 10-5 MG/ML-% IV SOLN
1.0000 g | Freq: Once | INTRAVENOUS | Status: AC
Start: 2013-01-18 — End: 2013-01-18
  Administered 2013-01-18: 1 g via INTRAVENOUS
  Filled 2013-01-18: qty 100

## 2013-01-18 MED ORDER — INSULIN GLARGINE 100 UNIT/ML SC SOLN
24.0000 [IU] | Freq: Every morning | SUBCUTANEOUS | Status: DC
Start: 2013-01-18 — End: 2013-01-18

## 2013-01-18 MED ORDER — LISINOPRIL 20 MG PO TABS
40.0000 mg | ORAL_TABLET | Freq: Every day | ORAL | Status: DC
Start: 2013-01-18 — End: 2013-01-19
  Administered 2013-01-18 – 2013-01-19 (×2): 40 mg via ORAL
  Filled 2013-01-18 (×2): qty 2

## 2013-01-18 NOTE — Progress Notes (Signed)
------------------------------------------------------------------------------------------------------------------    [facility] Attending Physician Addendum / Note    This patient was seen / examined / discussed with R2 / Dr. Army Melia.  Physical exam duplicated.  I agree with assessment and plan as outlined, with any additions or revisions below.     - abd CT  - cont Zofran / Phenergan / E-mycin / Ativan / Benadryl / IVF / PPI  - glycemic control / Lantus / SSI  - avoid opiates for pn control; favor Tramadol if needed for pn control  - OOB to chair; encouraged to ambulate  - ADAT  - dispo per GI recs

## 2013-01-18 NOTE — Procedures (Signed)
PICC Line Insertion Procedure Note    Procedure: Insertion of #5 FR/18G PICC    Indications:  Long Term IV therapy, Poor Access, ct scan    Procedure Details   Informed consent was obtained for the procedure from the patient.  Maximum sterile technique was used including antiseptics, cap, gloves, gown, hand hygiene, mask and sheet.    #5  FR/18G PICC inserted to the right brachial vein per hospital protocol.   Blood return:  yes    Findings:  Catheter inserted to 44 cm, with 0 cm. Exposed. Mid upper arm circumference is 41 cm.  There were no changes to vital signs. Catheter was flushed with 20 cc NS. Patient did tolerate procedure well.  picc tip verified in svc by sherlock ecg. picc ready for immediate use.  PICC Brochure given to patient with teaching instruction.

## 2013-01-18 NOTE — Plan of Care (Signed)
Pt continues to have nausea and vomiting, he has had  4- 5 episodes of emesis today, medicated with scheduled phenergan, PRN benadryl and zofran,  And valium, nausea persists. CT of the abdomen and pelvis with IV and oral contrast completed, result pending.  Current Vital signs : Blood pressure 149/99, pulse 98, temperature 95.3 F, resp. rate 18,  SpO2 96% on RA, pt denies chest pain and SOB, safety maintained, Will continue to monitor.

## 2013-01-18 NOTE — Plan of Care (Signed)
Pt still present N&V. Dr. Army Melia ordered to encourage pt to drink med for planned procedure. Pt assignment changed. Given report to Kara Mead, Charity fundraiser.

## 2013-01-18 NOTE — Progress Notes (Signed)
FAMILY MEDICINE PROGRESS NOTE    Date Time: 01/18/2013 6:32 AM  Patient Name: [name]  Attending Physician: Jillene Bucks, MD  Team Contact Info: [employer] [phone] 8 am -5 pm: After hours [phone]      Assessment:     Active Hospital Problems    Diagnosis   . Diabetic gastroparesis   . HTN (hypertension)   . Nausea & vomiting   . Type 2 diabetes, uncontrolled, with neuropathy   . Gastroparesis       37 yo male with h/o T1DM with peripheral neuropathy and gastroparesis, HTN presents with intractable nausea vomiting likely 2/2 diabetic gastroparesis, with hypertension    Plan:   # Nausea and vomiting 2/2 diabetic gastroparesis   - try to limit narcotics as it will slow gastric emptying but will try and treat pain as he often gets cyclic vomiting from worsening pain   - QT 442 - will monitor while on several QT prolonging agents - Tele,  Repeat EKG 12/4 QT 458 so QT stable   - mIVF , BMP daily, Magnesium   - Appreciate GI recommendations-miralax   - continue carafate , Protonix BID PO   - Will recommend follow up with [facility] for domperidone   - CT scan to evalauate for obstruction with IV and PO contrast- if need to would place NGT for administration of contrast and leave in place until after CT scan. Plan to place NGT to LCWS after procedure if obstruction  - Phenergan Scheduled for nausea, Benadryl PRN for nausea   - Erythromycin for gastric motility , will follow GI recommendations to stop reglan   - Advance diet as tolerated after CT scan to clear liquids    # HTN - BP improved   - home regimen is norvasc 10 mg daily and clonidine 0.1 mg q12h   - Continue clonidine patch for now 0.1mg /24 hr, Amlodipine 10 mg,  metoprolol 25 mg BID, Increase lisinopril to 40 mg  -Can use IV hydralazine as needed for SBP >180 after treating pain initially   - Would try and uptitrate other BP medications and discontinue clonidine tomorrow AM     # IDDM - uncontrolled - HbA1c 9.6   Blood sugars 200 - home regimen is lantus  36 units   - Continue high intensity sliding scale,  low dose lantus, increase to 22 units     # Diabetic Neuropathy   -Restart Gabapentin when tolerating PO     # FEN/GI: NPO for CT scan then resume clear liquids   #Full Code   # PPX: SCDs, lovenox    Disposition:     Anticipated discharge needs: None       Subjective     CC: nausea and vomiting     Interval History/24 hour events: Pain medication use decreased overnight attempt CT scan but no IV access. Patient describes persistent abdominal pain. Did have bowel movement. Ambulating in room. Denies Chest pain, shortness of breath     Physical Exam:       VITAL SIGNS PHYSICAL EXAM   Temp:  [97.8 F (36.6 C)-98.9 F (37.2 C)] 98.9 F (37.2 C)  Heart Rate:  [84-113] 91   Resp Rate:  [16-18] 18   BP: (115-176)/(67-106) 115/67 mmHg         Intake/Output Summary (Last 24 hours) at 01/18/13 8295  Last data filed at 01/17/13 2000   Gross per 24 hour   Intake 14837.5 ml   Output  7 ml   Net 14830.5 ml    Physical Exam  Gen: lying in bed, no acute distress    HEENT: dry MMM  Cardiac: RRR no Murmurs   Lungs: Clear to auscultation   Abdomen: soft , + Epigastric pain, no distension, no rebound   Ext: no edema        Meds:     Medications were reviewed.    Labs:       Labs (last 72 hours):      Lab 01/16/13 0703 01/15/13 0511   WBC 8.30 9.13   HGB 13.6 13.1   HCT 40.0* 38.9*   PLT 293 279       No results found for this basename: PT:2,INR:2,PTT:2 in the last 168 hours   Lab 01/16/13 0703 01/15/13 0511 01/11/13 2313   NA 138 137 --   K 3.6 4.0 --   CL 102 102 --   CO2 24 21* --   BUN 12.0 12.0 --   CREAT 1.1 1.2 --   CA 9.0 9.1 --   ALB -- -- 4.3   PROT -- -- 7.7   BILITOTAL -- -- 0.6   ALKPHOS -- -- 114   ALT -- -- 27   AST -- -- 17   GLU 211* 224* --         HbA1c: 9.6   Lipase 63 on admission      Microbiology, reviewed and are significant for:  None       Imaging personally reviewed, including:   AXR: no acute process    Case discussed with: Dr. Anselm Jungling    Signed by:  Kristine Linea, MD PGY-2  Pager [phone]  [facility]   [address]   [place], [place] [address]

## 2013-01-18 NOTE — Plan of Care (Signed)
Alert times four. SR on the monitor. Nausea vomiting ongoing. Yellowish color emesis present. Phenergan given as scheduled. Some effect. Medicated per MAR. Safety measures in place. Will continue to follow POC.

## 2013-01-19 LAB — CBC WITH MANUAL DIFFERENTIAL
Atypical Lymphocytes %: 25 %
Atypical Lymphocytes Absolute: 1.65 — ABNORMAL HIGH
Band Neutrophils Absolute: 0 (ref 0.00–1.00)
Band Neutrophils: 0 %
Basophils Absolute Manual: 0.06 (ref 0.00–0.20)
Basophils Manual: 1 %
Cell Morphology: NORMAL
Eosinophils Absolute Manual: 0.06 (ref 0.00–0.70)
Eosinophils Manual: 1 %
Hematocrit: 35.5 % — ABNORMAL LOW (ref 42.0–52.0)
Hgb: 11.8 g/dL — ABNORMAL LOW (ref 13.0–17.0)
Lymphocytes Absolute Manual: 2.08 (ref 0.50–4.40)
Lymphocytes Manual: 31 %
MCH: 26.5 pg — ABNORMAL LOW (ref 28.0–32.0)
MCHC: 33.2 g/dL (ref 32.0–36.0)
MCV: 79.8 fL — ABNORMAL LOW (ref 80.0–100.0)
MPV: 9.3 fL — ABNORMAL LOW (ref 9.4–12.3)
Monocytes Absolute: 0.43 (ref 0.00–1.20)
Monocytes Manual: 6 %
Neutrophils Absolute Manual: 2.45 (ref 1.80–8.10)
Nucleated RBC: 0 (ref 0–1)
Platelet Estimate: NORMAL
Platelets: 245 (ref 140–400)
RBC: 4.45 — ABNORMAL LOW (ref 4.70–6.00)
RDW: 13 % (ref 12–15)
Segmented Neutrophils: 36 %
WBC: 6.72 (ref 3.50–10.80)

## 2013-01-19 LAB — BASIC METABOLIC PANEL
BUN: 10 mg/dL (ref 9.0–21.0)
CO2: 25 mEq/L (ref 22–29)
Calcium: 7.7 mg/dL — ABNORMAL LOW (ref 8.5–10.5)
Chloride: 109 mEq/L — ABNORMAL HIGH (ref 98–107)
Creatinine: 1 mg/dL (ref 0.7–1.3)
Glucose: 95 mg/dL (ref 70–100)
Potassium: 2.9 mEq/L — ABNORMAL LOW (ref 3.5–5.1)
Sodium: 140 mEq/L (ref 136–145)

## 2013-01-19 LAB — ECG 12-LEAD
Atrial Rate: 79 {beats}/min
P Axis: 43 degrees
P-R Interval: 166 ms
Q-T Interval: 408 ms
QRS Duration: 92 ms
QTC Calculation (Bezet): 467 ms
R Axis: 32 degrees
T Axis: 26 degrees
Ventricular Rate: 79 {beats}/min

## 2013-01-19 LAB — GLUCOSE WHOLE BLOOD - POCT
Whole Blood Glucose POCT: 226 mg/dL — ABNORMAL HIGH (ref 70–100)
Whole Blood Glucose POCT: 128 mg/dL — ABNORMAL HIGH (ref 70–100)
Whole Blood Glucose POCT: 235 mg/dL — ABNORMAL HIGH (ref 70–100)

## 2013-01-19 LAB — CBC PATHOLOGIST REVIEW

## 2013-01-19 LAB — GFR: EGFR: >60

## 2013-01-19 LAB — MAGNESIUM: Magnesium: 1.9 mg/dL (ref 1.6–2.6)

## 2013-01-19 MED ORDER — POTASSIUM CHLORIDE 10 MEQ/100ML IV SOLN
10.0000 meq | INTRAVENOUS | Status: AC
Start: 2013-01-19 — End: 2013-01-19
  Administered 2013-01-19 (×2): 10 meq via INTRAVENOUS
  Filled 2013-01-19: qty 200

## 2013-01-19 MED ORDER — LISINOPRIL 40 MG PO TABS
40.0000 mg | ORAL_TABLET | Freq: Every day | ORAL | Status: DC
Start: 2013-01-19 — End: 2013-02-28

## 2013-01-19 MED ORDER — HYDRALAZINE HCL 20 MG/ML IJ SOLN
10.0000 mg | Freq: Four times a day (QID) | INTRAMUSCULAR | Status: DC | PRN
Start: 2013-01-19 — End: 2013-01-19

## 2013-01-19 MED ORDER — PROMETHAZINE HCL 25 MG PO TABS
25.0000 mg | ORAL_TABLET | Freq: Four times a day (QID) | ORAL | Status: DC | PRN
Start: 2013-01-19 — End: 2013-05-01

## 2013-01-19 MED ORDER — PROMETHAZINE HCL 25 MG PO TABS
25.0000 mg | ORAL_TABLET | Freq: Four times a day (QID) | ORAL | Status: DC | PRN
Start: 2013-01-19 — End: 2013-01-19

## 2013-01-19 MED ORDER — DIPHENHYDRAMINE HCL 25 MG PO TABS
25.0000 mg | ORAL_TABLET | Freq: Four times a day (QID) | ORAL | Status: DC | PRN
Start: 2013-01-19 — End: 2013-02-04

## 2013-01-19 MED ORDER — POLYETHYLENE GLYCOL 3350 17 G PO PACK
17.0000 g | PACK | Freq: Two times a day (BID) | ORAL | Status: DC
Start: 2013-01-19 — End: 2013-05-01

## 2013-01-19 MED ORDER — METOPROLOL TARTRATE 25 MG PO TABS
25.0000 mg | ORAL_TABLET | Freq: Two times a day (BID) | ORAL | Status: DC
Start: 2013-01-19 — End: 2013-02-28

## 2013-01-19 MED ORDER — INSULIN GLARGINE 100 UNIT/ML SC SOLN
22.0000 [IU] | Freq: Every evening | SUBCUTANEOUS | Status: DC
Start: 2013-01-19 — End: 2013-02-10

## 2013-01-19 NOTE — Plan of Care (Signed)
No changes through out the shift. VSS, on RA, denies SOB and chest pain. No c/o N/V at this time, Phenergan given as scheduled. Will continue to monitor Pt.

## 2013-01-19 NOTE — Progress Notes (Addendum)
FAMILY MEDICINE PROGRESS NOTE    Date Time: 01/19/2013 7:41 AM  Patient Name: [name]  Attending Physician: Jillene Bucks, MD  Team Contact Info: [employer] [phone] 8 am -5 pm: After hours [phone]      Assessment:     Active Hospital Problems    Diagnosis   . Diabetic gastroparesis   . HTN (hypertension)   . Nausea & vomiting   . Type 2 diabetes, uncontrolled, with neuropathy   . Gastroparesis       37 yo male with h/o T1DM with peripheral neuropathy and gastroparesis, HTN presents with intractable nausea vomiting likely 2/2 diabetic gastroparesis, with hypertension    Plan:   # Nausea and vomiting 2/2 diabetic gastroparesis : improved  -limit narcotic use if possible  - QT 442 - will monitor while on several QT prolonging agents - Tele,  Repeat EKG 12/4 QT 458 so QT stable   - BMP daily, Magnesium   -will d/c MIVF once on regular diet  - Appreciate GI recommendations-miralax   - continue carafate , Protonix BID PO   - follow up with [facility] for domperidone   - Phenergan Scheduled for nausea(changed from IV to PO), Benadryl PRN for nausea   - Erythromycin for gastric motility , will follow GI recommendations to stop reglan   - Advance diet to regular    #Hypokalemia  -20 meq K this am    # HTN - BP improved   - home regimen is norvasc 10 mg daily and clonidine 0.1 mg q12h   - will go home on Amlodipine 10 mg,  metoprolol 25 mg BID,  lisinopril to 40 mg  - IV hydralazine as needed for SBP >180 after treating pain initially   - will monitor BPs prior to D/C. Will increase metoprolol to 50 mg BID if needed. Otherwise, will be regulated as outpatien    # IDDM - uncontrolled - HbA1c 9.6   Blood sugars 200 - home regimen is lantus 36 units   - Continue high intensity sliding scale,  low dose lantus, increase to 22 units   -home regiment will be to increase 1u lantus from 22 every morning that am fasting reading is >120    # Diabetic Neuropathy   - Gabapentin when tolerating PO     # FEN/GI: NPO for CT scan then  resume clear liquids   #Full Code   # PPX: SCDs, lovenox    Disposition:     Anticipated discharge needs: None       Subjective     CC: nausea and vomiting     Interval History/24 hour events:   Pt is feeling better this am. Has had no vomiting over night. No abdominal pain. Has been able to tolerate PO with no issue.  Has been having diarrhea. Denies nausea    Physical Exam:       VITAL SIGNS PHYSICAL EXAM   Temp:  [95.3 F (35.2 C)-99.3 F (37.4 C)] 97.7 F (36.5 C)  Heart Rate:  [79-106] 85   Resp Rate:  [18-20] 20   BP: (122-182)/(67-105) 122/67 mmHg       No intake or output data in the 24 hours ending 01/19/13 0741 Physical Exam  Gen: no acute distress    HEENT: EOMI, MMM  Cardiac: RRR no Murmurs   Lungs: Clear to auscultation   Abdomen: soft , no Epigastric pain, no distension, no rebound   Ext: no edema  Meds:     Medications were reviewed.    Labs:       Labs (last 72 hours):      Lab 01/19/13 0555 01/18/13 0913   WBC 6.72 8.84   HGB 11.8* 13.9   HCT 35.5* 40.2*   PLT 245 273       No results found for this basename: PT:2,INR:2,PTT:2 in the last 168 hours   Lab 01/19/13 0555 01/18/13 0913   NA 140 139   K 2.9* 3.6   CL 109* 105   CO2 25 24   BUN 10.0 11.0   CREAT 1.0 1.1   CA 7.7* 8.9   ALB -- --   PROT -- --   BILITOTAL -- --   ALKPHOS -- --   ALT -- --   AST -- --   GLU 95 178*         HbA1c: 9.6   Lipase 63 on admission      Microbiology, reviewed and are significant for:  None       Imaging personally reviewed, including:   AXR: no acute process    Case discussed with: Dr. Sheliah Hatch    Signed by: Barnie Alderman, MD PGY-2  Pager [phone]  [facility]   [address]   [place], [place] [address]

## 2013-01-19 NOTE — Discharge Instructions (Signed)
To help with you Gastroparesis exacerbations please follow up with your GI doctor, In addition I have provided [facility] contact information to consider Domperidone   [facility]  [address], [place], [address] [place], [place] [address]    [phone] Schedule an Appointment, and General Information      Lantus Dose:  Check sugar every morning before breakfast.  -Start with 22 u of lantus. Increase by 1 unit every morning for readings >120.   -Do not increase if <120        Gastroparesis  Gastroparesis (also called delayed gastric emptying) occurs when the stomach takes longer than normal to empty of food. This is due to a problem with motility (the movement of the muscles in the digestive tract). For many people, gastroparesis is a lifelong condition. But treatment can help relieve symptoms and prevent complications. Read on to learn more about gastroparesis and how it can be managed.  How Gastroparesis Develops     Gastroparesis means that food and fluids move too slowly out of the stomach into the duodenum.   With normal motility, signals from nerves tell the stomach muscles when to contract. These muscles move food from the stomach into the duodenum (the first part of the small bowel). With gastroparesis, the nerves or muscles are damaged. This causes motility to slow down or stop completely. As a result, food cannot move from the stomach properly. This delayed emptying can cause nausea, vomiting, and other symptoms. Malnutrition can result. Bezoars (hardened lumps of food) can form in the stomach and cause other complications as well.  Causes of Gastroparesis  Gastroparesis can be caused by any of the following:   Diabetes   Surgery involving any of the digestive organs, such as the stomach and bowels   Certain medications, such as strong pain medications (narcotics)   Certain conditions, such as systemic scleroderma, Parkinson's disease, and thyroid disease  In many cases, the cause of  gastroparesis cannot be found.  Signs and Symptoms of Gastroparesis  These can include:   Nausea and vomiting   Feeling full quickly when eating   Abdominal pain   Heartburn   Abdominal bloating   Weight loss   Loss of appetite   High and low blood sugar levels (in people withdiabetes)  Diagnosing Gastroparesis  Your doctor will ask about your symptoms and health history. You'll also be examined. In addition, blood tests and X-rays are often done to check your health and rule out other problems. To confirm the problem, you may need other tests as well. These can include:   Upper endoscopy.This is doneto see inside the stomach and duodenum. For the test, an endoscope is used. This is a thin, flexible tube with a tiny camera on the end. It's inserted through the mouth and down into the stomach and duodenum.   Upper gastrointestinal (GI) series.This is doneto take X-rays of the upper GI tract from the mouth to the small bowel. For the test, a substance called barium is used. The barium coats the upper GI tract so that it will show up clearly on X-rays.   Gastric emptying scan.This is done to measure how quickly food leaves the stomach. For the test, a meal containing a harmless radioactive substance (tracer) is eaten. Then scans of the stomach are done. The tracer shows up clearly on the scans and shows the movement of the food through the stomach.  Treating Gastroparesis  The goal of treatment is to help you  manage your condition. Treatment may include one or more of the following:   Dietary changes.You may need to make changes to your eating habits and daily diet. For instance, your doctor may instruct you to eat small meals throughout the day. Doing this can keep you from feeling full too quickly. You may be placed on a liquid or "soft" diet. This means you'll eat liquid foods or foods that are mashed or put through a blender. In addition, you may need to avoid foods high in fats and fiber. These  can slow digestion. For more help with your diet, your doctor can refer you to a dietitian. In severe cases, you may need a feeding tube. This sends liquid food or medication directly to your small bowel, bypassing the stomach.   Medications.These can help manage symptoms, such as nausea and vomiting. They can also improve motility. Each medication has specific risks and side effects. Your doctor can tell you more about any medication that is prescribed for you.   Surgery.You may need to have a tube surgically inserted into the stomach. The tube removes excess air and fluid. This can relieve severe symptoms of nausea and vomiting. In rare cases, other surgery may be needed on the stomach or small bowel. This is to create a new passageway for food to be emptied from the stomach.   Other treatments.These include botulin toxin injection and gastric electrical stimulation. They are done less often and may not be available. Your doctor can tell you more about these treatments if they are options for you.  Diabetes and Gastroparesis  If you have diabetes, gastroparesis can make it harder to manage your blood sugar level. You'll need to take extra steps in your treatment to prevent complications. Work with your doctor to learn what you can do to protect your health. For more information, contact the American Diabetes Association,www.diabetes.org.   Long-term Concerns  With treatment, most people can manage their symptoms and maintain their usual routines. If your symptoms are moderate to severe, you may need to see your doctor more frequently for checkups. Also, other treatments will likely be needed.   [address], [address] [place], [place] [address]. All rights reserved. This information is not intended as a substitute for professional medical care. Always follow your healthcare professional's instructions.

## 2013-01-19 NOTE — Plan of Care (Signed)
RN DISCHARGE SUMMARY  PT doing much better today, no nausea or vomiting noted, he's adequate  For [place] per MD. PICC line discontinued, pressure held per protocol, port came out whole and pt tolerated procedure. Pt discontinued from telemetry. Prescriptions sent home with patient. Written and verbal discharge instructions reviewed. Pt verbalized understanding. Pt agreed to follow up with discharge appointment per discharge instructions. Pt discharged to home in stable condition.

## 2013-01-19 NOTE — Discharge Summary (Signed)
MEDICINE DISCHARGE SUMMARY    Date Time: 01/19/2013 1:49 PM  Patient Name: [name]  Attending Physician: Jillene Bucks, MD  Primary Care Physician: [name], MD    Date of Admission: 01/11/2013  Date of Discharge: 01/19/13    Discharge Diagnoses:     Principal Problem:   *Gastroparesis  Active Problems:   Type 2 diabetes, uncontrolled, with neuropathy   Nausea & vomiting   HTN (hypertension)   Diabetic gastroparesis  Resolved Problems:   * No resolved hospital problems. *       Disposition:     home with family    Pending Results, Recommendations & Instructions to providers after discharge:     1. Micro / Labs / Path pending: none   2. Wound Care Instructions: none  3. Date of completion for antibiotics or other medications: n/a    Recent Labs - Last 2:         Lab 01/19/13 0555 01/18/13 0913   WBC 6.72 8.84   HGB 11.8* 13.9   HCT 35.5* 40.2*   PLT 245 273       No results found for this basename: PT,INR,PTT in the last 168 hours  No results found for this basename: ALKPHOS:2,BILITOTAL:2,BILIDIRECT:2,PROT:2,ALB:2,ALT:2,AST:2 in the last 168 hours     Lab 01/19/13 0555 01/18/13 0913   NA 140 139   K 2.9* 3.6   CL 109* 105   CO2 25 24   BUN 10.0 11.0   GLU 95 178*   CA 7.7* 8.9   MG 1.9 1.9   PHOS -- --       No results found for this basename: CHOL,TRIG,HDL,LDL in the last 168 hours  No results found for this basename: TSH,FREET3,FREET4 in the last 168 hours         Procedures/Radiology performed:     Chest Ap Portable    01/12/2013   No acute abnormality.  Johnsie Kindred, MD  01/12/2013 12:03 AM     Ct Abdomen Pelvis W Iv And Po Cont    01/18/2013   1. No bowel obstruction. No localizing inflammatory changes. 2. Wall thickening of the sigmoid colon and rectum probably representing underdistention change but correlate clinically for colitis.  Kennyth Lose, MD  01/18/2013 4:58 PM        Hospital Course:     Reason for admission/ HPI:    [name] is a 37 y.o. male with a PMH significant for  Gastroparesis (multiple prior admissions), uncontrolled IDDM, and HTN who presents to the hospital with intractable nausea and vomiting starting yesterday afternoon. Pt states this is similar to his previous episodes of gastroparesis, and states he was recently hospitalized overnight in a hospital in [place] 4 days for the same issue. Yesterday he reports eating greens which his thinks is what triggered this most recent episode, and 4 days ago it was because he ate too much. Reports emesis as yellow in color, and pain in the upper abdomen. Denies having any CP, palpitations, SOB, f/c, diarrhea, constipation, HA, or vision changes. States he did not follow up with GI after last admission for evaluation of Domperidone. Reports compliance with medications.   ED Course:   - KUB negative for acute process   - s/p Zofran, Phenergan, Dilaudid IV, and 2L NS bolus with minimal relief   - Glucose 271, Cr 1.4   - Lipase and LFTs wnl   GI: [facility]  Course:   Pt admitted and originally started on IV phenergan, Reglan,  Benadryl, and erythromycin for his symptoms of N/V and abdominal pain. Reglan ws eventually discontinued and medications transitioned to PO and patient's symptoms improved and he was able to tolerate PO.  Pt was reccommended to follow up with Dr. Jens Som to consider evaluation at [facility] for Domperidone.    Tight glycemic control was kept through out admission. Lantus dose was switched to 22u from his home dose of 36u. On admission pt was instructed to continue with 22u and increase by 1 u every morning for fasting readings above 120.    Pt's blood pressures were elevated during admission likely 2/2 to abdominal pain. His clonodine ws d/ced and metoprolol was started. Lisinopril was changed to 40 mg        Discharge Day Exam:  Temp:  [97.5 F (36.4 C)-99.3 F (37.4 C)] 98.2 F (36.8 C)  Heart Rate:  [72-106] 72   Resp Rate:  [16-20] 16   BP: (116-182)/(58-105) 116/58  mmHg    Wounds/decutibus ulcers/stage: n/a    Consultations:     Treatment Team: Attending Provider: Jillene Bucks, MD; Resident: Kingsley Callander, MD; Consulting Physician: Lestine Mount, MD; Case Manager: Herbie Drape; Occupational Therapist: Claudia Desanctis, OT; Registered Nurse: Alain Honey, RN    Discharge Condition:     Hemodynamically stable and pain free    Discharge Instructions & Follow Up Plan for Patient:     Diet:diabetic diet    Activity/Weight Bearing Status:as tolerated      Patient was instructed to follow up with:   Primary Care Doctor [name], MD in  1 week & the following Consultants:  1. GI, Dr. Jens Som in 2 weeks    Discharge Code Status:full       Patient Emergency Contact:  SIMONS,NEFFY   [phone]   Friend    Complete instructions and follow up are in the patient's After Visit Summary    Minutes spent coordinating discharge and reviewing discharge plan: 45 minutes    Discharge Medications:        Medication List       As of 01/19/2013  1:49 PM      START taking these medications           diphenhydrAMINE 25 MG tablet    Commonly known as: BENADRYL    Take 1 tablet (25 mg total) by mouth every 6 (six) hours as needed for Itching.        lisinopril 40 MG tablet    Commonly known as: PRINIVIL,ZESTRIL    Take 1 tablet (40 mg total) by mouth daily.        metoprolol 25 MG tablet    Commonly known as: LOPRESSOR    Take 1 tablet (25 mg total) by mouth every 12 (twelve) hours.        polyethylene glycol packet    Commonly known as: MIRALAX    Take 17 g by mouth 2 (two) times daily.         CHANGE how you take these medications           insulin glargine 100 UNIT/ML injection    Commonly known as: LANTUS    Inject 22 Units into the skin nightly. Check am fasting sugars. Increase by 1 unit every morning for readings >120    What changed:  - dose  - doctor's instructions        promethazine 25 MG  tablet    Commonly known as: PHENERGAN    Take 1 tablet (25 mg total)  by mouth every 6 (six) hours as needed.    What changed:  - medication strength  - dose  - how often to take the med  - reasons to take the med         CONTINUE taking these medications           amLODIPine 5 MG tablet    Commonly known as: NORVASC    Take 2 tablets (10 mg total) by mouth daily.        erythromycin 250 MG tablet    Commonly known as: ERYTHROCIN        gabapentin 300 MG capsule    Commonly known as: NEURONTIN        LORazepam 0.5 MG tablet    Commonly known as: ATIVAN    Take 1 tablet (0.5 mg total) by mouth every 6 (six) hours as needed. PRN cyclic vomiting        pantoprazole 40 MG tablet    Commonly known as: PROTONIX        sucralfate 1 G tablet    Commonly known as: CARAFATE    Take 1 tablet (1 g total) by mouth 4 (four) times daily.        traMADol 50 MG tablet    Commonly known as: ULTRAM    Take 2 tablets (100 mg total) by mouth every 6 (six) hours as needed.         STOP taking these medications           cloNIDine 0.1 MG tablet    Commonly known as: CATAPRES               Where to get your medications     These are the prescriptions that you need to pick up.    You may get these medications from any pharmacy.           diphenhydrAMINE 25 MG tablet    insulin glargine 100 UNIT/ML injection    lisinopril 40 MG tablet    metoprolol 25 MG tablet    polyethylene glycol packet    promethazine 25 MG tablet                     Signed by:  Barnie Alderman, MD   PGY2  [facility]   Office: [phone]   Spectra: [phone]   After hours: [phone]       CC: [name], MD

## 2013-01-19 NOTE — Progress Notes (Signed)
[facility] Attending Note    Patient seen and evaluated on rounds.   Agree with physical exam findings, assessment and plan of Dr. Arminda Resides with the following additions and exceptions:     Advance diet as recommended by GI.    HTN:  No home hydralazine for elevated BPs  Continue with current medication changes.     If tolerating PO, Felicity today if cleared by GI.      Brunilda Payor, MD  [facility]  Physician ID: [id number]  [address]   [place], [place] [address]  P: [phone]  F: [phone]    [facility] Resident Spectralink: [phone]   Einar Gip Resident Spectralink: [phone]

## 2013-01-22 NOTE — Discharge Summary (Signed)
Per review of chart patient signed [facility] [place] papers and left hospital 01/09/13. Told nursing he thought it may have been something he ate and his presenting complaints resolved.  Please refer to chart and PN by  RN on 01/09/13 at 8:19 pm.  Duane [name]

## 2013-02-02 ENCOUNTER — Inpatient Hospital Stay
Admission: EM | Admit: 2013-02-02 | Discharge: 2013-02-08 | DRG: 074 | Disposition: A | Discharge status: Home / Self Care | Payer: BC Managed Care – PPO | Attending: Family Medicine | Admitting: Family Medicine

## 2013-02-02 ENCOUNTER — Inpatient Hospital Stay: Payer: BC Managed Care – PPO | Admitting: Family Medicine

## 2013-02-02 ENCOUNTER — Emergency Department: Payer: BC Managed Care – PPO

## 2013-02-02 DIAGNOSIS — E1143 Type 2 diabetes mellitus with diabetic autonomic (poly)neuropathy: Secondary | ICD-10-CM | POA: Diagnosis present

## 2013-02-02 DIAGNOSIS — Z794 Long term (current) use of insulin: Secondary | ICD-10-CM

## 2013-02-02 DIAGNOSIS — E860 Dehydration: Secondary | ICD-10-CM | POA: Diagnosis present

## 2013-02-02 DIAGNOSIS — I10 Essential (primary) hypertension: Secondary | ICD-10-CM | POA: Diagnosis present

## 2013-02-02 DIAGNOSIS — E1149 Type 2 diabetes mellitus with other diabetic neurological complication: Principal | ICD-10-CM | POA: Diagnosis present

## 2013-02-02 DIAGNOSIS — Z833 Family history of diabetes mellitus: Secondary | ICD-10-CM

## 2013-02-02 DIAGNOSIS — K5900 Constipation, unspecified: Secondary | ICD-10-CM | POA: Diagnosis present

## 2013-02-02 DIAGNOSIS — E669 Obesity, unspecified: Secondary | ICD-10-CM | POA: Diagnosis present

## 2013-02-02 DIAGNOSIS — K219 Gastro-esophageal reflux disease without esophagitis: Secondary | ICD-10-CM | POA: Diagnosis present

## 2013-02-02 DIAGNOSIS — E1142 Type 2 diabetes mellitus with diabetic polyneuropathy: Secondary | ICD-10-CM | POA: Diagnosis present

## 2013-02-02 DIAGNOSIS — N179 Acute kidney failure, unspecified: Secondary | ICD-10-CM | POA: Diagnosis present

## 2013-02-02 DIAGNOSIS — K3184 Gastroparesis: Secondary | ICD-10-CM | POA: Diagnosis present

## 2013-02-02 DIAGNOSIS — R1110 Vomiting, unspecified: Secondary | ICD-10-CM

## 2013-02-02 DIAGNOSIS — IMO0002 Reserved for concepts with insufficient information to code with codable children: Secondary | ICD-10-CM | POA: Diagnosis present

## 2013-02-02 DIAGNOSIS — Z6835 Body mass index (BMI) 35.0-35.9, adult: Secondary | ICD-10-CM

## 2013-02-02 LAB — CBC AND DIFFERENTIAL
Basophils Absolute Automated: 0.01 (ref 0.00–0.20)
Basophils Automated: 0 %
Eosinophils Absolute Automated: 0.02 (ref 0.00–0.70)
Eosinophils Automated: 0 %
Hematocrit: 41.4 % — ABNORMAL LOW (ref 42.0–52.0)
Hgb: 13.5 g/dL (ref 13.0–17.0)
Immature Granulocytes Absolute: 0.07 — ABNORMAL HIGH
Immature Granulocytes: 1 %
Lymphocytes Absolute Automated: 1.93 (ref 0.50–4.40)
Lymphocytes Automated: 16 %
MCH: 26.8 pg — ABNORMAL LOW (ref 28.0–32.0)
MCHC: 32.6 g/dL (ref 32.0–36.0)
MCV: 82.1 fL (ref 80.0–100.0)
MPV: 10.5 fL (ref 9.4–12.3)
Monocytes Absolute Automated: 0.65 (ref 0.00–1.20)
Monocytes: 5 %
Neutrophils Absolute: 9.77 — ABNORMAL HIGH (ref 1.80–8.10)
Neutrophils: 78 %
Nucleated RBC: 0 (ref 0–1)
Platelets: 298 (ref 140–400)
RBC: 5.04 (ref 4.70–6.00)
RDW: 13 % (ref 12–15)
WBC: 12.45 — ABNORMAL HIGH (ref 3.50–10.80)

## 2013-02-02 LAB — COMPREHENSIVE METABOLIC PANEL
ALT: 25 U/L (ref 0–55)
AST (SGOT): 19 U/L (ref 5–34)
Albumin/Globulin Ratio: 1.2 (ref 0.9–2.2)
Albumin: 4.2 g/dL (ref 3.5–5.0)
Alkaline Phosphatase: 107 U/L (ref 40–150)
BUN: 18 mg/dL (ref 9.0–21.0)
Bilirubin, Total: 0.5 mg/dL (ref 0.2–1.2)
CO2: 20 mEq/L — ABNORMAL LOW (ref 22–29)
Calcium: 9.6 mg/dL (ref 8.5–10.5)
Chloride: 103 mEq/L (ref 98–107)
Creatinine: 1.4 mg/dL — ABNORMAL HIGH (ref 0.7–1.3)
Globulin: 3.5 g/dL (ref 2.0–3.6)
Glucose: 232 mg/dL — ABNORMAL HIGH (ref 70–100)
Potassium: 4.6 mEq/L (ref 3.5–5.1)
Protein, Total: 7.7 g/dL (ref 6.0–8.3)
Sodium: 137 mEq/L (ref 136–145)

## 2013-02-02 LAB — GFR: EGFR: >60

## 2013-02-02 MED ORDER — SODIUM CHLORIDE 0.9 % IV BOLUS
1000.0000 mL | Freq: Once | INTRAVENOUS | Status: AC
Start: 2013-02-02 — End: 2013-02-03
  Administered 2013-02-02: 1000 mL via INTRAVENOUS

## 2013-02-02 MED ORDER — ONDANSETRON HCL 4 MG/2ML IJ SOLN
4.0000 mg | Freq: Once | INTRAMUSCULAR | Status: AC
Start: 2013-02-02 — End: 2013-02-02
  Administered 2013-02-02: 4 mg via INTRAVENOUS
  Filled 2013-02-02: qty 2

## 2013-02-02 MED ORDER — SODIUM CHLORIDE 0.9 % IV BOLUS
1000.0000 mL | Freq: Once | INTRAVENOUS | Status: AC
Start: 2013-02-02 — End: 2013-02-03
  Administered 2013-02-03: 1000 mL via INTRAVENOUS

## 2013-02-02 NOTE — ED Notes (Signed)
Pt c/o diffuse abdominal pain and emesis since this morning.  Reports history of gastroparesis.  Throwing up in triage, diaphoretic.  Reports ETOH use last night, none today. +chills.  Denies fevers or diarrhea. Patient denies travel outside of the country in the past month, and denies contact with anyone who has traveled out of the country or been ill in the past month.

## 2013-02-02 NOTE — ED Provider Notes (Signed)
None        Attending Note:  Resident's history reviewed, patient interviewed and examined.    Briefly, pertinent HPI is:     [name] is a 37 y.o. male w/ PMHx DM, HTN, Gastroparesis, cholecystectomy PSHx ETOH last night p/w epigastric abd pain and emesis since this morning. He has vomited multiple times since and has not been able to keep food or liquid down. He takes erythyromycin and reglan for his gastroparesis. Denies CP, fever, fatigue, SOB, diarrhea, dysuria, chills, diaphoresis, cough or other concerns.      My personal exam of patient reveals:   Constitutional: vomiting, uncomfortable  Head: Normocephalic, atraumatic  Eyes: No conjunctival injection. No discharge.  ENT: Mucous membranes moist  Neck: Normal range of motion. Non-tender.  Respiratory/Chest: Clear to auscultation. No respiratory distress.   Cardiovascular: Regular rate and rhythm. No murmur.   Abdomen: obese. Soft and non-tender. No guarding. No masses or hepatosplenomegaly.  LowerExtremity: No edema. No cyanosis.  Neurological: No focal motor deficits by observation. Speech normal. Memory normal.  Skin: Warm and dry. No rash.  Lymphatic: No cervical lymphadenopathy.  Psychiatric: Normal affect. Normal concentration.    I agree with assessment and care plan, and confirm the diagnosis(es) above.  With exception of  Defer pain management to admitting team.  Cautious with narcotics given paresis.  Attempt symptom control with pro motility and nausea meds.  Please see resident notes for details.        ____________________________________________________________________    I was acting as a Neurosurgeon for Leonidas Romberg Str* on [employer]  Treatment Team: Scribe: Kavin Leech     I am the first provider for this patient and I personally performed the services documented. Treatment Team: Scribe: Kavin Leech is scribing for me on Morre,Nial L. This note accurately reflects work and decisions made by me.  Leonidas Romberg Str*____________________________________________________________________      Pat Patrick, MD  02/03/13 [phone]

## 2013-02-02 NOTE — ED Provider Notes (Signed)
Physician/Midlevel provider first contact with patient: 02/02/13 2255         History     Chief Complaint   Patient presents with   . Abdominal Pain   . Emesis     Patient is a 37 y.o. male presenting with abdominal pain and vomiting.   Abdominal Pain  The primary symptoms of the illness include abdominal pain, nausea and vomiting. The primary symptoms of the illness do not include fever, fatigue, shortness of breath or dysuria.   Symptoms associated with the illness do not include chills or diaphoresis.   Emesis   Associated symptoms include abdominal pain. Pertinent negatives include no chills, no cough, no fever and no myalgias.   37 y.o. male PMH cholecystectomy, complicated DM2 c/b multiple episodes of gastroparesis who presents with N/V since this morning. He has vomited multiple times since and has not been able to keep food or liquid down. Complaining of epigastric abdominal pain. Drank ETOH last night. No fevers, recent travel, recent illness, diarrhea, or new foods tried. He takes erythyromycin and reglan for his gastroparesis. Takes lantus/novolog for his diabetes. Last sugar he measured today was 290. Denies chest pain, SOB, history of SBOs.     Past Medical History   Diagnosis Date   . Diabetes    . Hypertensive disorder    . Gastroparesis        Past Surgical History   Procedure Date   . Cholecystectomy    . Knee arthroscopy w/ acl reconstruction      left   . Egd 08/08/2012     Procedure: EGD;  Surgeon: [name], MD;  [facility];  Service: Gastroenterology;  Laterality: N/A;  w\bx       Family History   Problem Relation Age of Onset   . Diabetes Father    . Diabetes Maternal Grandmother    . Diabetes Paternal Grandmother        Social  History   Substance Use Topics   . Smoking status: Never Smoker    . Smokeless tobacco: Never Used   . Alcohol Use: Yes      Comment: "occasional"       .     No Known Allergies    Current/Home Medications    AMLODIPINE (NORVASC) 5 MG  TABLET    Take 2 tablets (10 mg total) by mouth daily.    DIPHENHYDRAMINE (BENADRYL) 25 MG TABLET    Take 1 tablet (25 mg total) by mouth every 6 (six) hours as needed for Itching.    ERYTHROMYCIN (ERYTHROCIN) 250 MG TABLET    Take by mouth 3 (three) times daily. unknown dose    GABAPENTIN (NEURONTIN) 300 MG CAPSULE    Take 300 mg by mouth nightly.    INSULIN GLARGINE (LANTUS) 100 UNIT/ML INJECTION    Inject 22 Units into the skin nightly. Check am fasting sugars. Increase by 1 unit every morning for readings >120    LISINOPRIL (PRINIVIL,ZESTRIL) 40 MG TABLET    Take 1 tablet (40 mg total) by mouth daily.    LORAZEPAM (ATIVAN) 0.5 MG TABLET    Take 1 tablet (0.5 mg total) by mouth every 6 (six) hours as needed. PRN cyclic vomiting    METOPROLOL (LOPRESSOR) 25 MG TABLET    Take 1 tablet (25 mg total) by mouth every 12 (twelve) hours.    PANTOPRAZOLE (PROTONIX) 40 MG TABLET    Take 40 mg by mouth daily.    POLYETHYLENE  GLYCOL (MIRALAX) PACKET    Take 17 g by mouth 2 (two) times daily.    PROMETHAZINE (PHENERGAN) 25 MG TABLET    Take 1 tablet (25 mg total) by mouth every 6 (six) hours as needed.    SUCRALFATE (CARAFATE) 1 G TABLET    Take 1 tablet (1 g total) by mouth 4 (four) times daily.    TRAMADOL (ULTRAM) 50 MG TABLET    Take 2 tablets (100 mg total) by mouth every 6 (six) hours as needed.        Review of Systems   Constitutional: Negative for fever, chills, diaphoresis and fatigue.   HENT: Negative for facial swelling.    Eyes: Negative for visual disturbance.   Respiratory: Negative for cough and shortness of breath.    Cardiovascular: Negative for chest pain.   Gastrointestinal: Positive for nausea, vomiting and abdominal pain.   Genitourinary: Negative for dysuria.   Musculoskeletal: Negative for myalgias.   Skin: Negative for color change.   Neurological: Negative for seizures and light-headedness.   Psychiatric/Behavioral: Negative for confusion and agitation.   All other systems reviewed and are  negative.        Physical Exam    BP: 207/108 mmHg, Heart Rate: 121 , Temp: 97.7 F (36.5 C), Resp Rate: 16 , SpO2: 100 %, Weight: 127.007 kg    Physical Exam   Constitutional: He is oriented to person, place, and time. He appears well-developed and well-nourished. He appears distressed.   HENT:   Head: Atraumatic.   Eyes: Pupils are equal, round, and reactive to light. Right eye exhibits no discharge.   Neck: Normal range of motion. Neck supple.   Cardiovascular: Normal rate and regular rhythm.  Exam reveals no gallop and no friction rub.    No murmur heard.  Pulmonary/Chest: Breath sounds normal. He has no wheezes. He has no rales. He exhibits no tenderness.   Abdominal: Soft. Bowel sounds are normal. He exhibits no distension. There is tenderness. There is no rebound and no guarding.        TTP epigastric region   Musculoskeletal: Normal range of motion.   Neurological: He is alert and oriented to person, place, and time. No cranial nerve deficit. Coordination normal.   Skin: Skin is warm. No rash noted. No erythema.   Psychiatric: He has a normal mood and affect.       MDM and ED Course     ED Medication Orders      Start     Status Ordering Provider    02/02/13 2234   ondansetron [facility] injection 4 mg   Once      Route: Intravenous  Ordered Dose: 4 mg         Acknowledged Mart Piggs    02/02/13 2233   sodium chloride 0.9 % bolus 1,000 mL   Once      Route: Intravenous  Ordered Dose: 1,000 mL         Acknowledged Mart Piggs                 MDM  37 y.o. male abd pain, N/V for 1 day.    Gastroparesis vs SBO vs gastroenteritis vs HHS    -CBC/CMP/lipase  -upright abd  -zofran/ns bolus    Pt not responsive to zofran.    -reglan    Pt not responsive to reglan    -erythromycin drip    Pt requesting opiates. Had lengthy discussion the negative consequences of  opiate medication for gastroparesis.     Medicine aware 0350. Will admit to Dr. Rene Kocher Med bed.      Procedures    Clinical Impression &  Disposition     Clinical Impression  Final diagnoses:   None        ED Disposition     None           New Prescriptions    No medications on file        Treatment Team: Scribe: Ardelle Park, MD  Resident  02/02/13 2310    Mart Piggs, MD  Resident  02/02/13 2312    Daleiza Bacchi, Deanna Artis, MD  Resident  02/02/13 2314    Mart Piggs, MD  Resident  02/03/13 [phone]

## 2013-02-03 LAB — BASIC METABOLIC PANEL
BUN: 17 mg/dL (ref 9.0–21.0)
BUN: 21 mg/dL (ref 9.0–21.0)
CO2: 16 mEq/L — ABNORMAL LOW (ref 22–29)
CO2: 25 mEq/L (ref 22–29)
Calcium: 9.4 mg/dL (ref 8.5–10.5)
Calcium: 8.9 mg/dL (ref 8.5–10.5)
Chloride: 103 mEq/L (ref 98–107)
Chloride: 106 mEq/L (ref 98–107)
Creatinine: 1.4 mg/dL — ABNORMAL HIGH (ref 0.7–1.3)
Creatinine: 1.3 mg/dL (ref 0.7–1.3)
Glucose: 290 mg/dL — ABNORMAL HIGH (ref 70–100)
Glucose: 163 mg/dL — ABNORMAL HIGH (ref 70–100)
Potassium: 5.4 mEq/L — ABNORMAL HIGH (ref 3.5–5.1)
Potassium: 4.2 mEq/L (ref 3.5–5.1)
Sodium: 136 mEq/L (ref 136–145)
Sodium: 139 mEq/L (ref 136–145)

## 2013-02-03 LAB — GFR
EGFR: >60
EGFR: >60

## 2013-02-03 LAB — URINALYSIS WITH MICROSCOPIC
Bilirubin, UA: NEGATIVE
Blood, UA: NEGATIVE
Glucose, UA: >1000 — AB
Ketones UA: 10
Leukocyte Esterase, UA: NEGATIVE
Nitrite, UA: NEGATIVE
Protein, UR: 30 — AB
Specific Gravity UA: 1.02 (ref 1.001–1.035)
Urine pH: 6 (ref 5.0–8.0)
Urobilinogen, UA: NORMAL mg/dL

## 2013-02-03 LAB — GLUCOSE WHOLE BLOOD - POCT
Whole Blood Glucose POCT: 211 mg/dL — ABNORMAL HIGH (ref 70–100)
Whole Blood Glucose POCT: 136 mg/dL — ABNORMAL HIGH (ref 70–100)
Whole Blood Glucose POCT: 148 mg/dL — ABNORMAL HIGH (ref 70–100)

## 2013-02-03 MED ORDER — METOCLOPRAMIDE HCL 5 MG/ML IJ SOLN
10.0000 mg | Freq: Three times a day (TID) | INTRAMUSCULAR | Status: DC
Start: 2013-02-03 — End: 2013-02-08
  Administered 2013-02-03 – 2013-02-08 (×16): 10 mg via INTRAVENOUS
  Filled 2013-02-03 (×19): qty 2

## 2013-02-03 MED ORDER — PANTOPRAZOLE SODIUM 40 MG IV SOLR
40.0000 mg | Freq: Every day | INTRAVENOUS | Status: DC
Start: 2013-02-03 — End: 2013-02-08
  Administered 2013-02-03 – 2013-02-08 (×6): 40 mg via INTRAVENOUS
  Filled 2013-02-03 (×6): qty 40

## 2013-02-03 MED ORDER — DEXTROSE 50 % IV SOLN
25.0000 mL | INTRAVENOUS | Status: DC | PRN
Start: 2013-02-03 — End: 2013-02-08

## 2013-02-03 MED ORDER — GLUCOSE 40 % PO GEL
15.0000 g | ORAL | Status: DC | PRN
Start: 2013-02-03 — End: 2013-02-08

## 2013-02-03 MED ORDER — HEPARIN SODIUM (PORCINE) 5000 UNIT/ML IJ SOLN
5000.0000 [IU] | Freq: Three times a day (TID) | INTRAMUSCULAR | Status: DC
Start: 2013-02-03 — End: 2013-02-08
  Administered 2013-02-03 – 2013-02-08 (×16): 5000 [IU] via SUBCUTANEOUS
  Filled 2013-02-03 (×16): qty 1

## 2013-02-03 MED ORDER — METOCLOPRAMIDE HCL 5 MG/ML IJ SOLN
10.0000 mg | Freq: Once | INTRAMUSCULAR | Status: AC
Start: 2013-02-03 — End: 2013-02-03
  Administered 2013-02-03: 10 mg via INTRAVENOUS
  Filled 2013-02-03: qty 2

## 2013-02-03 MED ORDER — KETOROLAC TROMETHAMINE 30 MG/ML IJ SOLN
30.0000 mg | Freq: Once | INTRAMUSCULAR | Status: DC
Start: 2013-02-03 — End: 2013-02-03

## 2013-02-03 MED ORDER — KETOROLAC TROMETHAMINE 30 MG/ML IJ SOLN
INTRAMUSCULAR | Status: AC
Start: 2013-02-03 — End: 2013-02-03
  Administered 2013-02-03: 30 mg via INTRAVENOUS
  Filled 2013-02-03: qty 1

## 2013-02-03 MED ORDER — NALOXONE HCL 0.4 MG/ML IJ SOLN
0.2000 mg | INTRAMUSCULAR | Status: DC | PRN
Start: 2013-02-03 — End: 2013-02-08

## 2013-02-03 MED ORDER — SODIUM CHLORIDE 0.9 % IV SOLN
100.0000 mg | Freq: Three times a day (TID) | INTRAVENOUS | Status: DC
Start: 2013-02-03 — End: 2013-02-08
  Administered 2013-02-03 – 2013-02-08 (×14): 100 mg via INTRAVENOUS
  Filled 2013-02-03 (×20): qty 100

## 2013-02-03 MED ORDER — LABETALOL HCL 5 MG/ML IV SOLN
20.0000 mg | Freq: Once | INTRAVENOUS | Status: AC
Start: 2013-02-03 — End: 2013-02-03
  Administered 2013-02-03: 20 mg via INTRAVENOUS
  Filled 2013-02-03: qty 20

## 2013-02-03 MED ORDER — HYDROMORPHONE HCL PF 1 MG/ML IJ SOLN
0.5000 mg | INTRAMUSCULAR | Status: DC | PRN
Start: 2013-02-03 — End: 2013-02-06
  Administered 2013-02-03 – 2013-02-06 (×18): 0.5 mg via INTRAVENOUS
  Filled 2013-02-03 (×17): qty 1

## 2013-02-03 MED ORDER — GLUCAGON HCL (RDNA) 1 MG IJ SOLR
1.0000 mg | INTRAMUSCULAR | Status: DC | PRN
Start: 2013-02-03 — End: 2013-02-08

## 2013-02-03 MED ORDER — INSULIN ASPART 100 UNIT/ML SC SOLN
1.0000 [IU] | SUBCUTANEOUS | Status: DC | PRN
Start: 2013-02-03 — End: 2013-02-03

## 2013-02-03 MED ORDER — KETOROLAC TROMETHAMINE 30 MG/ML IJ SOLN
30.0000 mg | Freq: Once | INTRAMUSCULAR | Status: AC
Start: 2013-02-03 — End: 2013-02-08

## 2013-02-03 MED ORDER — SODIUM CHLORIDE 0.9 % IV SOLN
400.0000 mg | Freq: Once | INTRAVENOUS | Status: AC
Start: 2013-02-03 — End: 2013-02-03
  Administered 2013-02-03: 400 mg via INTRAVENOUS
  Filled 2013-02-03: qty 400

## 2013-02-03 MED ORDER — INSULIN GLARGINE 100 UNIT/ML SC SOLN
22.0000 [IU] | Freq: Every evening | SUBCUTANEOUS | Status: DC
Start: 2013-02-03 — End: 2013-02-08
  Administered 2013-02-03 – 2013-02-07 (×6): 22 [IU] via SUBCUTANEOUS
  Filled 2013-02-03 (×3): qty 220

## 2013-02-03 MED ORDER — PROMETHAZINE HCL 25 MG/ML IJ SOLN
12.5000 mg | Freq: Four times a day (QID) | INTRAMUSCULAR | Status: DC
Start: 2013-02-03 — End: 2013-02-08
  Administered 2013-02-03 – 2013-02-08 (×22): 12.5 mg via INTRAVENOUS
  Filled 2013-02-03 (×22): qty 1

## 2013-02-03 MED ORDER — SODIUM CHLORIDE 0.9 % IV SOLN
INTRAVENOUS | Status: DC
Start: 2013-02-03 — End: 2013-02-06
  Administered 2013-02-05: 125 mL/h via INTRAVENOUS

## 2013-02-03 MED ORDER — INSULIN ASPART 100 UNIT/ML SC SOLN
1.0000 [IU] | SUBCUTANEOUS | Status: DC | PRN
Start: 2013-02-03 — End: 2013-02-08
  Administered 2013-02-03: 3 [IU] via SUBCUTANEOUS
  Administered 2013-02-04 – 2013-02-06 (×4): 1 [IU] via SUBCUTANEOUS
  Filled 2013-02-03: qty 30
  Filled 2013-02-03: qty 10

## 2013-02-03 NOTE — ED Notes (Signed)
IV not drawing blood back unable to collect BMP. Denq, MD notified.

## 2013-02-03 NOTE — Procedures (Signed)
PROCEDURE: MIDLINE INSERTION    Date of Insertion: 02/03/2013    INDICATIONS: IV Access. NO PIV    NOTE: Only Midline was ordered for 2 or 3 days of IV access.     PROCEDURE DETAILS:     The midline procedure, risks, benefits were discussed with the Patient/Family.  All questions were answered. verbalized understanding and agreed to proceed. Midline education/instructions provided.    The patient was positioned and Ultrasound was used to confirm patency of the vein prior to obtaining venous access. The arm was scrubbed with 2% chlorhexidine per guidelines and a maximal sterile field was established for the patient.  The clinician was attired with cap, mask and sterile gown/gloves prior to start.     A sterile cover was sheathed to the Ultrasound probe. The vein was then revisualized and 1% lidocaine injected prior to puncture  with a 21-gauge single-wall needle under direct sonographic guidance. The guidewire was advanced through the needle, the needle was removed and a peel-away sheath was placed over the wire.   Midline then was trimmed to insure tip location at or below the level of axillar. The catheter was then advanced through the peel-away sheath. The sheath was removed. The catheter was flushed with normal saline to confirm brisk blood return and capped. The catheter was stabilized on the skin using a securement device. Antimicrobial disk and sterile transparent occlusive dressing applied using aseptic technique.     Patient did tolerate the procedure well.     Catheter Type: 4 Fr non - power Poly Midline  Insertion Site: RT Brachial vein  Total length: 20 cm  Internal Length:  20cm  External Length: 0cm  UAC: 40cm    Midline Reference #: [id number]  Midline Kit Lot#: [id number]  Midline Kit expiration date: 2015    Findings/Conclusions:  No signs of bleeding or symptoms of nerve irritation noted at time of insertion procedure.  Tip location is at the level of the axilla.     Midline is ready for immediate  use.    Renee Harder, RN. CRNI

## 2013-02-03 NOTE — Progress Notes (Signed)
Attending Progress Note    A/P: I agree with the exam, assessment and plan as per Dr. Blenda Nicely this AM.   Pt better this AM.  Still with nausea.  Not vomiting.   Will monitor today and send home this PM or tomorrow morning depending on his response to oral.       S:Pt seen and examined concurrently today with Dr. Blenda Nicely.  Chart reviewed.  Case discussed in detail.      Patient Active Problem List   Diagnosis   . GERD (gastroesophageal reflux disease)   . Gastroparesis   . Dehydration, moderate   . Gastritis without bleeding   . Type 2 diabetes, uncontrolled, with neuropathy   . Hypertension   . Nausea & vomiting   . Intractable vomiting   . HTN (hypertension)   . Diabetic gastroparesis   . Intractable nausea and vomiting   . Vomiting         Current Facility-Administered Medications   Medication Dose Route Frequency Last Rate Last Dose   . 0.9%  NaCl infusion   Intravenous Continuous 125 mL/hr at 02/03/13 0552     . dextrose (GLUCOSE) 40 % oral gel 15 g  15 g Oral PRN       . dextrose 50 % bolus 25 mL  25 mL Intravenous PRN       . erythromycin (ERYTHROCIN) 100 mg in sodium chloride 0.9 % 250 mL IVPB  100 mg Intravenous Q8H 250 mL/hr at 02/03/13 1003 100 mg at 02/03/13 1003   . [COMPLETED] erythromycin (ERYTHROCIN) 400 mg in sodium chloride 0.9 % 250 mL IVPB  400 mg Intravenous Once   400 mg at 02/03/13 0254   . glucagon (rDNA) (GLUCAGEN) injection 1 mg  1 mg Intramuscular PRN       . heparin (porcine) injection 5,000 Units  5,000 Units Subcutaneous Q8H SCH   5,000 Units at 02/03/13 0552   . HYDROmorphone (DILAUDID) injection 0.5 mg  0.5 mg Intravenous Q2H PRN   0.5 mg at 02/03/13 0958   . insulin aspart (NovoLOG) injection 1-8 Units  1-8 Units Subcutaneous PRN       . insulin glargine (LANTUS) injection 22 Units  22 Units Subcutaneous QHS   22 Units at 02/03/13 0552   . ketorolac (TORADOL) injection 30 mg  30 mg Intravenous Once       . [COMPLETED] labetalol (NORMODYNE,TRANDATE) injection 20 mg  20 mg Intravenous  Once   20 mg at 02/03/13 0552   . [COMPLETED] metoclopramide (REGLAN) injection 10 mg  10 mg Intravenous Once   10 mg at 02/03/13 0254   . metoclopramide (REGLAN) injection 10 mg  10 mg Intravenous Q8H SCH   10 mg at 02/03/13 1039   . naloxone (NARCAN) injection 0.2 mg  0.2 mg Intravenous PRN       . [COMPLETED] ondansetron (ZOFRAN) injection 4 mg  4 mg Intravenous Once   4 mg at 02/02/13 2314   . pantoprazole (PROTONIX) injection 40 mg  40 mg Intravenous Daily   40 mg at 02/03/13 1003   . promethazine (PHENERGAN) injection 12.5 mg  12.5 mg Intravenous Q6H SCH   12.5 mg at 02/03/13 0551   . [COMPLETED] sodium chloride 0.9 % bolus 1,000 mL  1,000 mL Intravenous Once   1,000 mL at 02/02/13 2321   . [COMPLETED] sodium chloride 0.9 % bolus 1,000 mL  1,000 mL Intravenous Once   1,000 mL at 02/03/13 0254   . [DISCONTINUED] insulin  aspart (NovoLOG) injection 1-5 Units  1-5 Units Subcutaneous PRN       . [COMPLETED] ketorolac (TORADOL) injection 30 mg  30 mg Intravenous Once   30 mg at 02/03/13 0336       O: Patient is alert and non-toxic.   Heart: Regular   Lungs: Clear   Abdomen: Soft and NT   Ext: No edema   Mental status: Alert.         Filed Vitals:    02/03/13 0731   BP: 146/85   Pulse: 116   Temp: 98.8 F (37.1 C)   Resp: 16   SpO2: 96%         Intake/Output Summary (Last 24 hours) at 02/03/13 1104  Last data filed at 02/03/13 0437   Gross per 24 hour   Intake    250 ml   Output    600 ml   Net   -350 ml          Results     Procedure Component Value Units Date/Time    Basic Metabolic Panel (BMP) [[id number]]  (Abnormal) Collected:02/03/13 0409    Specimen Information:Blood Updated:02/03/13 0500     Glucose 290 (H) mg/dL      BUN 40.9 mg/dL      Creatinine 1.4 (H) mg/dL      CALCIUM 9.4 mg/dL      Sodium 811 mEq/L      Potassium 5.4 (H) mEq/L      Chloride 103 mEq/L      CO2 16 (L) mEq/L     GFR [[id number]] Collected:02/03/13 0409     EGFR >60.0 Updated:02/03/13 0500    LAB-UA with Micro [[id number]]  (Abnormal)  Collected:02/03/13 0136    Specimen Information:Urine Updated:02/03/13 0203     Urine Type Clean Catch      Color, UA Yellow      Clarity, UA Clear      Specific Gravity UA 1.020      Urine pH 6.0      Leukocyte Esterase, UA Negative      Nitrite, UA Negative      Protein, UR 30 (A)      Glucose, UA >1000 (A)      Ketones UA 10      Urobilinogen, UA Normal mg/dL      Bilirubin, UA Negative      Blood, UA Negative      RBC, UA 0 - 5      WBC, UA 0 - 5      Squamous Epithelial Cells, Urine 0 - 5      Hyaline Casts, UA 3 - 5     Comprehensive Metabolic Panel (CMP) [[id number]]  (Abnormal) Collected:02/02/13 2301    Specimen Information:Blood Updated:02/02/13 2326     Glucose 232 (H) mg/dL      BUN 41.3 mg/dL      Creatinine 1.4 (H) mg/dL      Sodium 244 mEq/L      Potassium 4.6 mEq/L      Chloride 103 mEq/L      CO2 20 (L) mEq/L      CALCIUM 9.6 mg/dL      Protein, Total 7.7 g/dL      Albumin 4.2 g/dL      AST (SGOT) 19 U/L      ALT 25 U/L      Alkaline Phosphatase 107 U/L      Bilirubin, Total 0.5 mg/dL      Globulin 3.5 g/dL  Albumin/Globulin Ratio 1.2     GFR [[id number]] Collected:02/02/13 2301     EGFR >60.0 Updated:02/02/13 2326    CBC and Differential [[id number]]  (Abnormal) Collected:02/02/13 2301    Specimen Information:Blood / Blood Updated:02/02/13 2309     WBC 12.45 (H)      RBC 5.04      Hgb 13.5 g/dL      Hematocrit 78.2 (L) %      MCV 82.1 fL      MCH 26.8 (L) pg      MCHC 32.6 g/dL      RDW 13 %      Platelets 298      MPV 10.5 fL      Neutrophils 78 %      Lymphocytes Automated 16 %      Monocytes 5 %      Eosinophils Automated 0 %      Basophils Automated 0 %      Immature Granulocyte 1 %      Nucleated RBC 0      Neutrophils Absolute 9.77 (H)      Abs Lymph Automated 1.93      Abs Mono Automated 0.65      Abs Eos Automated 0.02      Absolute Baso Automated 0.01      Absolute Immature Granulocyte 0.07 (H)           Radiology Results (24 Hour)     Procedure Component Value Units Date/Time     RAD-Abdomen AP Decub And/Or Erect Views [[id number]] Collected:02/03/13 0110    Order Status:Completed  Updated:02/03/13 0115    Narrative:    TECHNIQUE: Supine and upright views of the abdomen    INDICATION: Nausea. Vomiting.    COMPARISON:  01/18/2013    FINDINGS:     LINES/TUBES:  Status post cholecystectomy.    LUNG BASES:  The visualized lung bases are clear.    FREE AIR: No radiographic evidence of free air.    ORGANS: No organomegaly.    BOWEL:  Moderate retained stool in the colon. Unremarkable bowel gas  pattern without evidence of obstruction.    CALCIFICATIONS:  No abnormal calcifications.    BONES:  Unremarkable.      Impression:         Moderate retained stool in the colon.    Johnsie Kindred, MD   02/03/2013 1:11 AM                Ashok Cordia M.D. [address] [facility]  [phone]

## 2013-02-03 NOTE — H&P (Signed)
ADMISSION HISTORY AND PHYSICAL EXAM    Date Time: 02/03/2013 3:44 AM  Patient Name: [name]  Attending Physician: Mariea Stable*  Primary Care Physician: [name], MD    CC: N/V      History of Presenting Illness:   [name] is a 37 y.o. male male with a PMH significant for Gastroparesis (multiple prior admissions), uncontrolled IDDM, and HTN who presents to the hospital with intractable nausea and vomiting starting last night.  States he drank EtOH two nights ago.  Denies CP, SOB, diarrhea, dysuria, fever or chills.      GI: [facility]    Past Medical History:     Past Medical History   Diagnosis Date   . Diabetes    . Hypertensive disorder    . Gastroparesis        Available old records reviewed    Past Surgical History:     Past Surgical History   Procedure Date   . Cholecystectomy    . Knee arthroscopy w/ acl reconstruction      left   . Egd 08/08/2012     Procedure: EGD;  Surgeon: [name], MD;  Location: [facility];  Service: Gastroenterology;  Laterality: N/A;  w\bx       Family History:     Family History   Problem Relation Age of Onset   . Diabetes Father    . Diabetes Maternal Grandmother    . Diabetes Paternal Grandmother        Social History:     History     Social History   . Marital Status: Single     Spouse Name: N/A     Number of Children: N/A   . Years of Education: N/A     Social History Main Topics   . Smoking status: Never Smoker    . Smokeless tobacco: Never Used   . Alcohol Use: Yes      Comment: "occasional"   . Drug Use: No   . Sexually Active: Not on file     Other Topics Concern   . Not on file     Social History Narrative   . No narrative on file       Allergies:   No Known Allergies    Medications:     Prior to Admission medications    Medication Sig Start Date End Date Taking? Authorizing Provider   amLODIPine (NORVASC) 5 MG tablet Take 2 tablets (10 mg total) by mouth daily. 10/10/12   Noyes, Orland Jarred, MD    diphenhydrAMINE (BENADRYL) 25 MG tablet Take 1 tablet (25 mg total) by mouth every 6 (six) hours as needed for Itching. 01/19/13   Barnie Alderman, MD   erythromycin (ERYTHROCIN) 250 MG tablet Take by mouth 3 (three) times daily. unknown dose    [provider]   gabapentin (NEURONTIN) 300 MG capsule Take 300 mg by mouth nightly.    [provider]   insulin glargine (LANTUS) 100 UNIT/ML injection Inject 22 Units into the skin nightly. Check am fasting sugars. Increase by 1 unit every morning for readings >120 01/19/13   Barnie Alderman, MD   lisinopril (PRINIVIL,ZESTRIL) 40 MG tablet Take 1 tablet (40 mg total) by mouth daily. 01/19/13   Barnie Alderman, MD   LORazepam (ATIVAN) 0.5 MG tablet Take 1 tablet (0.5 mg total) by mouth every 6 (six) hours as needed. PRN cyclic vomiting 10/26/12   Auburn Bilberry  A, MD   metoprolol (LOPRESSOR) 25 MG tablet Take 1 tablet (25 mg total) by mouth every 12 (twelve) hours. 01/19/13   Barnie Alderman, MD   pantoprazole (PROTONIX) 40 MG tablet Take 40 mg by mouth daily. 10/07/12   Noyes, Orland Jarred, MD   polyethylene glycol [facility]) packet Take 17 g by mouth 2 (two) times daily. 01/19/13   Barnie Alderman, MD   promethazine (PHENERGAN) 25 MG tablet Take 1 tablet (25 mg total) by mouth every 6 (six) hours as needed. 01/19/13   Barnie Alderman, MD   sucralfate (CARAFATE) 1 G tablet Take 1 tablet (1 g total) by mouth 4 (four) times daily. 10/07/12   Noyes, Orland Jarred, MD   traMADol (ULTRAM) 50 MG tablet Take 2 tablets (100 mg total) by mouth every 6 (six) hours as needed. 10/10/12   Kristine Linea, MD       Review of Systems:   All other systems were reviewed and are negative except: see HPI    Physical Exam:   Patient Vitals for the past 24 hrs:   BP Temp Pulse Resp SpO2 Weight   02/03/13 0332 178/104 mmHg - 118  - 97 % -   02/03/13 0303 170/95 mmHg - 118  17  97 % -   02/02/13 2322 168/96 mmHg - 111  18  96 % -   02/02/13 2207 207/108 mmHg 97.7 F (36.5 C) 121  16  100 % 127.007 kg (280  lb)     There is no height on file to calculate BMI.    Intake/Output Summary (Last 24 hours) at 02/03/13 0344  Last data filed at 02/03/13 0120   Gross per 24 hour   Intake      0 ml   Output    500 ml   Net   -500 ml       General: awake, alert, oriented x 3; distressed.  HEENT: perrla, eomi, sclera anicteric  oropharynx clear without lesions, mucous membranes dry  Neck: supple, no lymphadenopathy, no thyromegaly, no JVD, no carotid bruits  Cardiovascular: tachycardic, no murmurs, rubs or gallops  Lungs: clear to auscultation bilaterally, without wheezing, rhonchi, or rales  Abdomen: soft, epigastric TTP, mildly distended; no palpable masses, no hepatosplenomegaly, normoactive bowel sounds, no rebound or guarding  Extremities: 2+ pitting edema BLE   Neuro: cranial nerves grossly intact, strength 5/5 in upper and lower extremities, sensation intact,   Skin: no rashes or lesions noted        Labs:     Results     Procedure Component Value Units Date/Time    LAB-UA with Micro [[id number]]  (Abnormal) Collected:02/03/13 0136    Specimen Information:Urine Updated:02/03/13 0203     Urine Type Clean Catch      Color, UA Yellow      Clarity, UA Clear      Specific Gravity UA 1.020      Urine pH 6.0      Leukocyte Esterase, UA Negative      Nitrite, UA Negative      Protein, UR 30 (A)      Glucose, UA >1000 (A)      Ketones UA 10      Urobilinogen, UA Normal mg/dL      Bilirubin, UA Negative      Blood, UA Negative      RBC, UA 0 - 5      WBC, UA 0 - 5      Squamous Epithelial  Cells, Urine 0 - 5      Hyaline Casts, UA 3 - 5     Comprehensive Metabolic Panel (CMP) [[id number]]  (Abnormal) Collected:02/02/13 2301    Specimen Information:Blood Updated:02/02/13 2326     Glucose 232 (H) mg/dL      BUN 40.9 mg/dL      Creatinine 1.4 (H) mg/dL      Sodium 811 mEq/L      Potassium 4.6 mEq/L      Chloride 103 mEq/L      CO2 20 (L) mEq/L      CALCIUM 9.6 mg/dL      Protein, Total 7.7 g/dL      Albumin 4.2 g/dL      AST (SGOT) 19 U/L       ALT 25 U/L      Alkaline Phosphatase 107 U/L      Bilirubin, Total 0.5 mg/dL      Globulin 3.5 g/dL      Albumin/Globulin Ratio 1.2     GFR [[id number]] Collected:02/02/13 2301     EGFR >60.0 Updated:02/02/13 2326    CBC and Differential [[id number]]  (Abnormal) Collected:02/02/13 2301    Specimen Information:Blood / Blood Updated:02/02/13 2309     WBC 12.45 (H)      RBC 5.04      Hgb 13.5 g/dL      Hematocrit 46.9 (L) %      MCV 82.1 fL      MCH 26.8 (L) pg      MCHC 32.6 g/dL      RDW 13 %      Platelets 298      MPV 10.5 fL      Neutrophils 78 %      Lymphocytes Automated 16 %      Monocytes 5 %      Eosinophils Automated 0 %      Basophils Automated 0 %      Immature Granulocyte 1 %      Nucleated RBC 0      Neutrophils Absolute 9.77 (H)      Abs Lymph Automated 1.93      Abs Mono Automated 0.65      Abs Eos Automated 0.02      Absolute Baso Automated 0.01      Absolute Immature Granulocyte 0.07 (H)           Imaging personally reviewed      Assessment:     Patient Active Problem List    Diagnosis Date Noted   . Intractable nausea and vomiting 10/05/2012   . Diabetic gastroparesis 08/22/2012   . Intractable vomiting 08/05/2012   . HTN (hypertension) 08/05/2012   . Type 2 diabetes, uncontrolled, with neuropathy 06/27/2012   . Hypertension 06/27/2012   . Nausea & vomiting 06/27/2012   . GERD (gastroesophageal reflux disease) 06/17/2012   . Gastroparesis 06/17/2012   . Dehydration, moderate 06/17/2012   . Gastritis without bleeding 06/17/2012       [name] is a 37 y.o. male male with a PMH significant for Gastroparesis (multiple prior admissions), uncontrolled IDDM, and HTN who presents to the hospital with intractable nausea and vomiting starting last night.    Plan:   # Nausea and Vomiting, likely 2/2 Gastroparesis flare   - Phenergan 12.5 mg IV q6h scheduled   - Reglan 10 mg IV TID scheduled   - Erythromycin 100 IV q8h scheduled   - Carafate 1 gm PO QID when tolerating PO   -  Protonix IV   - can  consider Ativan q6h with Benadryl IV q6h if n/v not improved.   - will try to avoid opiates since they decrease GI motility   - IVFs   - NPO   - consult GI in the AM     # IDDM, uncontrolled   - takes Lantus 36 units qhs, will restart at 22 units   - will order SSI while NPO     # AKI, likely pre-renal in setting of dehydration   - IVFs as noted above   - will monitor     # HTN   - Labetalol PRN   - hold home PO meds while NPO     Code: FULL   Diet: NPO   PPX: SCDs, SQH   Status/Rationale: Admit to observation     D/w Dr. Yetta Barre     Signed by: Esmond Camper, MD   VW:[id number], Vania Rea, MD

## 2013-02-03 NOTE — Plan of Care (Signed)
Pt alert and oriented on admission BP elevated still vomiting brownish in color with red tinged. Pt given phenergan and started on IVF c/o pain 10/10 in the stomach .5 dilaudid given. Pt also give lantus 22 units and labetalol. Pt on room air lungs clear, abdomen tender to touch. Pt oriented to room call bell in reach. Will monitor for safety.

## 2013-02-03 NOTE — ED Notes (Signed)
Resident at bedside attempting IV access

## 2013-02-03 NOTE — ED Notes (Signed)
Denq, MD at bedside attempting new IV and collection of blood specimen- BMP.

## 2013-02-03 NOTE — Discharge Summary (Signed)
FAMILY MEDICINE DISCHARGE SUMMARY    Date Time: 02/08/2013 11:19 AM  Patient Name: [name]  Attending Physician: Ashok Cordia, MD  Primary Care Physician: [name], MD    Date of Admission: 02/02/2013  Date of Discharge: 02/08/2013    Discharge Diagnoses:     Principal Problem:   *Diabetic gastroparesis  Active Problems:   GERD (gastroesophageal reflux disease)   Type 2 diabetes, uncontrolled, with neuropathy   HTN (hypertension)  Resolved Problems:   Vomiting      Disposition:     home with family    Pending Results & Recommendations:     1. Micro / Labs / Path pending: n/a   2. Wound Care Instructions: n/a  3. Date of completion for antibiotics or other medications: n/a    Recent Labs - Last 2:         Lab 02/06/13 6213 02/05/13 0503   WBC 6.62 9.94   HGB 12.3* 13.4   HCT 37.1* 41.4*   PLT 280 332       No results found for this basename: PT,INR,PTT in the last 168 hours    Lab 02/02/13 2301   ALKPHOS 107   BILITOTAL 0.5   BILIDIRECT --   PROT 7.7   ALB 4.2   ALT 25   AST 19        Lab 02/08/13 0645 02/08/13 0634 02/07/13 0543   NA 139 -- 137   K 3.2* -- 3.8   CL 102 -- 100   CO2 28 -- 25   BUN 10.0 -- 9.0   GLU 102* -- 145*   CA 8.9 -- 9.1   MG -- 1.9 --   PHOS -- -- --       No results found for this basename: CHOL,TRIG,HDL,LDL in the last 168 hours  No results found for this basename: TSH,FREET3,FREET4 in the last 168 hours         Procedures/Radiology performed:     XR ABDOMEN AP DECUB AND/OR ERECT VIEWS       Hospital Course:     Reason for admission/ HPI: [name] is a 37 y.o. male male with a PMH significant for Gastroparesis (multiple prior admissions), uncontrolled IDDM, and HTN who presents to the hospital with intractable nausea and vomiting starting last night. States he drank EtOH two nights ago. Denies CP, SOB, diarrhea, dysuria, fever or chills.     PCP: Dr. Alben Spittle  GI: [facility] Hospital Course: 37 y.o. male male with a PMH significant for Gastroparesis  (multiple prior admissions), uncontrolled IDDM, and HTN who presents to the hospital with intractable nausea and vomiting. He was started on IVF, phenergan, reglan, erythromycin, protonix, ativan, banadryl, with slow improvement in his gastroparesis symptoms by hospital day 6 . His diet was slowly advanced to low residue which he tolerated well on day of discharge. GI was consulted and assisted with management. He was discharged with his usual regimen and with instructions to f/u as an outpatient with his PCP and GI physician.      Discharge Day Exam:  Temp:  [97.3 F (36.3 C)-98.7 F (37.1 C)] 97.3 F (36.3 C)  Heart Rate:  [74-99] 74   Resp Rate:  [14-16] 16   BP: (143-168)/(80-101) 143/89 mmHg    General: awake, alert, oriented x 3; NAD   HEENT: perrla, eomi, sclera anicteric oropharynx clear without lesions, MMM  Neck: supple, no lymphadenopathy, no  thyromegaly, no JVD, no carotid bruits   Cardiovascular: RRR, no murmurs, rubs or gallops   Lungs: clear to auscultation bilaterally, without wheezing, rhonchi, or rales   Abdomen: soft, NTND; no palpable masses, no hepatosplenomegaly, normoactive bowel sounds, no rebound or guarding   Extremities: 2+ pitting edema BLE   Neuro: cranial nerves grossly intact, strength 5/5 in upper and lower extremities, sensation intact,   Skin: no rashes or lesions noted    Wounds/decutibus ulcers/stage: n/a    Consultations:     Treatment Team: Attending Provider: Ashok Cordia, MD; Resident: Mart Piggs, MD; Case Manager: Bonna Gains; Consulting Physician: Jacqulyn Cane, MD; Registered Nurse: Daisy Lazar, RN    Discharge Condition:     Stable    Discharge Instructions & Follow Up Plan for Patient:     Diet: Diabetic diet, small low fat, low residue meals    Activity/Weight Bearing Status: As tolerated    Patient was instructed to follow up with:   Primary Care Doctor [name], MD in 2 days & the following Consultants:  1. Dr. Jens Som (GI) in  1 week    Complete instructions and follow up are in the patient's After Visit Summary    Minutes spent coordinating discharge and reviewing discharge plan: 30 minutes    Discharge Medications:        Medication List       As of 02/08/2013 11:19 AM      CONTINUE taking these medications           amLODIPine 5 MG tablet    Commonly known as: NORVASC    Take 2 tablets (10 mg total) by mouth daily.        erythromycin 250 MG tablet    Commonly known as: ERYTHROCIN        gabapentin 300 MG capsule    Commonly known as: NEURONTIN        insulin glargine 100 UNIT/ML injection    Commonly known as: LANTUS    Inject 22 Units into the skin nightly. Check am fasting sugars. Increase by 1 unit every morning for readings >120        lisinopril 40 MG tablet    Commonly known as: PRINIVIL,ZESTRIL    Take 1 tablet (40 mg total) by mouth daily.        LORazepam 0.5 MG tablet    Commonly known as: ATIVAN    Take 1 tablet (0.5 mg total) by mouth every 6 (six) hours as needed. PRN cyclic vomiting        metoprolol 25 MG tablet    Commonly known as: LOPRESSOR    Take 1 tablet (25 mg total) by mouth every 12 (twelve) hours.        pantoprazole 40 MG tablet    Commonly known as: PROTONIX        polyethylene glycol packet    Commonly known as: MIRALAX    Take 17 g by mouth 2 (two) times daily.        promethazine 25 MG tablet    Commonly known as: PHENERGAN    Take 1 tablet (25 mg total) by mouth every 6 (six) hours as needed.        sucralfate 1 G tablet    Commonly known as: CARAFATE    Take 1 tablet (1 g total) by mouth 4 (four) times daily.        traMADol 50 MG tablet    Commonly known  as: ULTRAM    Take 2 tablets (100 mg total) by mouth every 6 (six) hours as needed.         STOP taking these medications           diphenhydrAMINE 25 MG tablet    Commonly known as: BENADRYL               Where to get your medications     These are the prescriptions that you need to pick up.    You may get these medications from any pharmacy.            LORazepam 0.5 MG tablet                     Ernst Spell, M.D.  [facility] PGY-2  [phone]  [phone]

## 2013-02-03 NOTE — Plan of Care (Signed)
Pt is A&O x 4, able to verbalize needs, on clear liquids diet but unable to tolerate because of nausea and vomiting. C/o abdominal pain, PRN Dilaudid given. Able to ambulates independently. IV line infiltrated on left arm, elevated on pillows, MD notified. For midline insertion. Will continue to monitor and ensure safety.

## 2013-02-03 NOTE — ED Notes (Signed)
[name], EMT attempting IV access

## 2013-02-04 LAB — CBC AND DIFFERENTIAL
Basophils Absolute Automated: 0.01 (ref 0.00–0.20)
Basophils Automated: 0 %
Eosinophils Absolute Automated: 0.06 (ref 0.00–0.70)
Eosinophils Automated: 1 %
Hematocrit: 37.6 % — ABNORMAL LOW (ref 42.0–52.0)
Hgb: 12.1 g/dL — ABNORMAL LOW (ref 13.0–17.0)
Immature Granulocytes Absolute: 0.01
Immature Granulocytes: 0 %
Lymphocytes Absolute Automated: 2.17 (ref 0.50–4.40)
Lymphocytes Automated: 34 %
MCH: 27 pg — ABNORMAL LOW (ref 28.0–32.0)
MCHC: 32.2 g/dL (ref 32.0–36.0)
MCV: 83.9 fL (ref 80.0–100.0)
MPV: 9.8 fL (ref 9.4–12.3)
Monocytes Absolute Automated: 0.49 (ref 0.00–1.20)
Monocytes: 8 %
Neutrophils Absolute: 3.56 (ref 1.80–8.10)
Neutrophils: 56 %
Nucleated RBC: 0 (ref 0–1)
Platelets: 277 (ref 140–400)
RBC: 4.48 — ABNORMAL LOW (ref 4.70–6.00)
RDW: 13 % (ref 12–15)
WBC: 6.3 (ref 3.50–10.80)

## 2013-02-04 LAB — GFR: EGFR: >60

## 2013-02-04 LAB — BASIC METABOLIC PANEL
BUN: 15 mg/dL (ref 9.0–21.0)
CO2: 24 mEq/L (ref 22–29)
Calcium: 8.6 mg/dL (ref 8.5–10.5)
Chloride: 109 mEq/L — ABNORMAL HIGH (ref 98–107)
Creatinine: 1 mg/dL (ref 0.7–1.3)
Glucose: 119 mg/dL — ABNORMAL HIGH (ref 70–100)
Potassium: 4.1 mEq/L (ref 3.5–5.1)
Sodium: 141 mEq/L (ref 136–145)

## 2013-02-04 LAB — GLUCOSE WHOLE BLOOD - POCT
Whole Blood Glucose POCT: 118 mg/dL — ABNORMAL HIGH (ref 70–100)
Whole Blood Glucose POCT: 179 mg/dL — ABNORMAL HIGH (ref 70–100)
Whole Blood Glucose POCT: 194 mg/dL — ABNORMAL HIGH (ref 70–100)
Whole Blood Glucose POCT: 182 mg/dL — ABNORMAL HIGH (ref 70–100)

## 2013-02-04 MED ORDER — LISINOPRIL 10 MG PO TABS
40.0000 mg | ORAL_TABLET | Freq: Every day | ORAL | Status: DC
Start: 2013-02-04 — End: 2013-02-08
  Administered 2013-02-05 – 2013-02-08 (×4): 40 mg via ORAL
  Filled 2013-02-04 (×5): qty 4

## 2013-02-04 MED ORDER — ONDANSETRON HCL 4 MG/2ML IJ SOLN
4.0000 mg | Freq: Three times a day (TID) | INTRAMUSCULAR | Status: DC | PRN
Start: 2013-02-04 — End: 2013-02-08
  Administered 2013-02-04 – 2013-02-07 (×8): 4 mg via INTRAVENOUS
  Filled 2013-02-04 (×8): qty 2

## 2013-02-04 MED ORDER — METOPROLOL TARTRATE 25 MG PO TABS
25.0000 mg | ORAL_TABLET | Freq: Two times a day (BID) | ORAL | Status: DC
Start: 2013-02-04 — End: 2013-02-08
  Administered 2013-02-05 – 2013-02-08 (×5): 25 mg via ORAL
  Filled 2013-02-04 (×8): qty 1

## 2013-02-04 NOTE — Plan of Care (Signed)
Pt a/o x 4, pain medication administered for abdominal pain, pain relieved, pt appears to be resting comfortably. Pt vomiting at beginning of shift, nausea medications administered as ordered, pt slept well overnight, no more episodes of vomiting observed. Independent oob, steady on feet. No difficulty swallowing. Will continue to monitor.

## 2013-02-04 NOTE — Progress Notes (Signed)
FAMILY MEDICINE PROGRESS NOTE    Date Time: 02/04/2013 11:19 AM  Patient Name: [name]  Attending Physician: Ashok Cordia, MD  Team Contact Info: spectra [phone], pager [phone], office [phone]     Assessment:   Principal Problem:   *Diabetic gastroparesis  Active Problems:   GERD (gastroesophageal reflux disease)   Type 2 diabetes, uncontrolled, with neuropathy   HTN (hypertension)   Vomiting  Resolved Problems:   * No resolved hospital problems. *       [name] is a 37 y.o. male male with a PMH significant for Gastroparesis (multiple prior admissions), uncontrolled IDDM, and HTN who presents to the hospital with intractable nausea and vomiting starting last night.      Plan:   # Nausea and Vomiting, likely 2/2 Gastroparesis flare   - Phenergan 12.5 mg IV q6h scheduled   - Reglan 10 mg IV TID scheduled   - Erythromycin 100 IV q8h scheduled   - Carafate 1 gm PO QID when tolerating PO   - Protonix IV   - can consider Ativan q6h with Benadryl IV q6h if n/v not improved.   - will try to use opiates sparingly since they decrease GI motility   - IVFs   - will need to f/u with his GI doctor as an outpatient    # IDDM, uncontrolled   - takes Lantus 36 units qhs, will restart at 22 units with poor PO intake  - will order SSI while poor PO    # AKI, likely pre-renal in setting of dehydration   - IVFs as noted above   - will monitor     # HTN   - resume home lisinopril 40 mg qd, metoprolol 25 mg BID    Code: FULL     Diet: clears, advance as tolerated    PPX: SCDs, SQH     Dispo - possible d/c home today if better PO, or if not, tomorrow      Subjective     CC: Diabetic gastroparesis    Interval History/24 hour events: PIV infiltrate, hard stick, midline placed    HPI/Subjective: Feeling somewhat better today and tried to drink some clears, but then became nauseated again and had worsening of abdominal pain     Review of Systems:     Denies CP, dyspnea, F/C    Physical Exam:     VITAL SIGNS   Temp:   [97 F (36.1 C)-97.8 F (36.6 C)] 97 F (36.1 C)  Heart Rate:  [90-116] 94   Resp Rate:  [14-20] 18   BP: (122-146)/(65-81) 122/65 mmHg      No intake or output data in the 24 hours ending 02/04/13 1119     Physical Exam  General: awake, alert, oriented x 3; NAD.   HEENT: perrla, eomi, sclera anicteric oropharynx clear without lesions, MMM   Neck: supple, no lymphadenopathy, no thyromegaly, no JVD, no carotid bruits   Cardiovascular: RRR, no murmurs, rubs or gallops   Lungs: clear to auscultation bilaterally, without wheezing, rhonchi, or rales   Abdomen: soft, epigastric TTP, nondistended; no palpable masses, no hepatosplenomegaly, normoactive bowel sounds, no rebound or guarding   Extremities: 2+ pitting edema BLE   Neuro: cranial nerves grossly intact, strength 5/5 in upper and lower extremities, sensation intact,   Skin: no rashes or lesions noted      Meds:     Medications were reviewed:    Labs:     Labs (last  24 hours):  Results     Procedure Component Value Units Date/Time    Glucose Whole Blood - POCT [[id number]]  (Abnormal) Collected:02/04/13 0734     POCT - Glucose Whole blood 118 (H) mg/dL BMWUXLK:44/01/02 7253    Basic Metabolic Panel [[id number]]  (Abnormal) Collected:02/04/13 0418    Specimen Information:Blood Updated:02/04/13 0509     Glucose 119 (H) mg/dL      BUN 25.9 mg/dL      Creatinine 1.0 mg/dL      CALCIUM 8.6 mg/dL      Sodium 563 mEq/L      Potassium 4.1 mEq/L      Chloride 109 (H) mEq/L      CO2 24 mEq/L     GFR [[id number]] Collected:02/04/13 0418     EGFR >60.0 Updated:02/04/13 0509    CBC with differential [[id number]]  (Abnormal) Collected:02/04/13 0418    Specimen Information:Blood / Blood Updated:02/04/13 0445     WBC 6.30      RBC 4.48 (L)      Hgb 12.1 (L) g/dL      Hematocrit 63.0 (L) %      MCV 83.9 fL      MCH 27.0 (L) pg      MCHC 32.2 g/dL      RDW 13 %      Platelets 277      MPV 9.8 fL      Neutrophils 56 %      Lymphocytes Automated 34 %      Monocytes 8 %      Eosinophils  Automated 1 %      Basophils Automated 0 %      Immature Granulocyte 0 %      Nucleated RBC 0      Neutrophils Absolute 3.56      Abs Lymph Automated 2.17      Abs Mono Automated 0.49      Abs Eos Automated 0.06      Absolute Baso Automated 0.01      Absolute Immature Granulocyte 0.01     Glucose Whole Blood - POCT [[id number]]  (Abnormal) Collected:02/03/13 2206     POCT - Glucose Whole blood 148 (H) mg/dL FTDDUKG:25/42/70 6237    Glucose Whole Blood - POCT [[id number]]  (Abnormal) Collected:02/03/13 1637     POCT - Glucose Whole blood 136 (H) mg/dL HYWVPXT:08/07/92 8546    GFR [[id number]] Collected:02/03/13 1456     EGFR >60.0 Updated:02/03/13 1522    Basic Metabolic Panel [[id number]]  (Abnormal) Collected:02/03/13 1456    Specimen Information:Blood Updated:02/03/13 1522     Glucose 163 (H) mg/dL      BUN 69.6 mg/dL      Creatinine 1.3 mg/dL      CALCIUM 8.9 mg/dL      Sodium 789 mEq/L      Potassium 4.2 mEq/L      Chloride 106 mEq/L      CO2 25 mEq/L     Glucose Whole Blood - POCT [[id number]]  (Abnormal) Collected:02/03/13 1125     POCT - Glucose Whole blood 211 (H) mg/dL CHENIDP:82/42/35 3614            Imaging (last 24 hours), reviewed and are significant for:  No results found.      Signed by:   Ernst Spell, MD  [facility] PGY-2  [phone]  Spectra *[phone]

## 2013-02-04 NOTE — Plan of Care (Signed)
Pt is a&o x 4, able to verbalize needs. Ambulates independently. Pt c/o abdominal pain and nausea at 0930, Dilaudid IV PRN given, MD aware. Unable to administer oral medications, pt refused because of nausea & vomiting. Notified MD about frequent vomiting, ordered Zofran PRN, given with minimal relief. Will continue to monitor.

## 2013-02-04 NOTE — Progress Notes (Signed)
Attending Progress Note    A/P: I agree with the exam, assessment and plan as per Dr. Blenda Nicely this AM.  Was better this AM but now with some nausea on clears.  Agree with watching today and consider discharge if improves.         S:Pt seen and examined concurrently today with Dr. Blenda Nicely.  Chart reviewed.  Case discussed in detail.      Patient Active Problem List   Diagnosis   . GERD (gastroesophageal reflux disease)   . Gastroparesis   . Dehydration, moderate   . Gastritis without bleeding   . Type 2 diabetes, uncontrolled, with neuropathy   . Hypertension   . Nausea & vomiting   . Intractable vomiting   . HTN (hypertension)   . Diabetic gastroparesis   . Intractable nausea and vomiting   . Vomiting         Current Facility-Administered Medications   Medication Dose Route Frequency Last Rate Last Dose   . 0.9%  NaCl infusion   Intravenous Continuous 125 mL/hr at 02/04/13 0542     . dextrose (GLUCOSE) 40 % oral gel 15 g  15 g Oral PRN       . dextrose 50 % bolus 25 mL  25 mL Intravenous PRN       . erythromycin (ERYTHROCIN) 100 mg in sodium chloride 0.9 % 250 mL IVPB  100 mg Intravenous Q8H 250 mL/hr at 02/04/13 0542 100 mg at 02/04/13 0542   . glucagon (rDNA) (GLUCAGEN) injection 1 mg  1 mg Intramuscular PRN       . heparin (porcine) injection 5,000 Units  5,000 Units Subcutaneous Q8H SCH   5,000 Units at 02/04/13 0542   . HYDROmorphone (DILAUDID) injection 0.5 mg  0.5 mg Intravenous Q2H PRN   0.5 mg at 02/04/13 0924   . insulin aspart (NovoLOG) injection 1-8 Units  1-8 Units Subcutaneous PRN   3 Units at 02/03/13 1155   . insulin glargine (LANTUS) injection 22 Units  22 Units Subcutaneous QHS   22 Units at 02/03/13 2111   . ketorolac (TORADOL) injection 30 mg  30 mg Intravenous Once       . metoclopramide (REGLAN) injection 10 mg  10 mg Intravenous Q8H SCH   10 mg at 02/04/13 1042   . naloxone (NARCAN) injection 0.2 mg  0.2 mg Intravenous PRN       . pantoprazole (PROTONIX) injection 40 mg  40 mg Intravenous  Daily   40 mg at 02/04/13 0924   . promethazine (PHENERGAN) injection 12.5 mg  12.5 mg Intravenous Q6H SCH   12.5 mg at 02/04/13 0542       O: Patient is alert and non-toxic.   Heart: Reglar   Mental status: Alert         Filed Vitals:    02/04/13 0736   BP: 122/65   Pulse: 94   Temp: 97 F (36.1 C)   Resp: 18   SpO2: 98%       No intake or output data in the 24 hours ending 02/04/13 1114       Results     Procedure Component Value Units Date/Time    Glucose Whole Blood - POCT [[id number]]  (Abnormal) Collected:02/04/13 0734     POCT - Glucose Whole blood 118 (H) mg/dL WUJWJXB:14/78/29 5621    Basic Metabolic Panel [[id number]]  (Abnormal) Collected:02/04/13 0418    Specimen Information:Blood Updated:02/04/13 0509     Glucose 119 (H) mg/dL  BUN 15.0 mg/dL      Creatinine 1.0 mg/dL      CALCIUM 8.6 mg/dL      Sodium 098 mEq/L      Potassium 4.1 mEq/L      Chloride 109 (H) mEq/L      CO2 24 mEq/L     GFR [[id number]] Collected:02/04/13 0418     EGFR >60.0 Updated:02/04/13 0509    CBC with differential [[id number]]  (Abnormal) Collected:02/04/13 0418    Specimen Information:Blood / Blood Updated:02/04/13 0445     WBC 6.30      RBC 4.48 (L)      Hgb 12.1 (L) g/dL      Hematocrit 78.4 (L) %      MCV 83.9 fL      MCH 27.0 (L) pg      MCHC 32.2 g/dL      RDW 13 %      Platelets 277      MPV 9.8 fL      Neutrophils 56 %      Lymphocytes Automated 34 %      Monocytes 8 %      Eosinophils Automated 1 %      Basophils Automated 0 %      Immature Granulocyte 0 %      Nucleated RBC 0      Neutrophils Absolute 3.56      Abs Lymph Automated 2.17      Abs Mono Automated 0.49      Abs Eos Automated 0.06      Absolute Baso Automated 0.01      Absolute Immature Granulocyte 0.01     Glucose Whole Blood - POCT [[id number]]  (Abnormal) Collected:02/03/13 2206     POCT - Glucose Whole blood 148 (H) mg/dL XLKGMWN:02/72/53 6644    Glucose Whole Blood - POCT [[id number]]  (Abnormal) Collected:02/03/13 1637     POCT - Glucose Whole blood 136  (H) mg/dL GLOVFIE:33/29/51 8841    GFR [[id number]] Collected:02/03/13 1456     EGFR >60.0 Updated:02/03/13 1522    Basic Metabolic Panel [[id number]]  (Abnormal) Collected:02/03/13 1456    Specimen Information:Blood Updated:02/03/13 1522     Glucose 163 (H) mg/dL      BUN 32.2 mg/dL      Creatinine 1.3 mg/dL      CALCIUM 8.9 mg/dL      Sodium 025 mEq/L      Potassium 4.2 mEq/L      Chloride 106 mEq/L      CO2 25 mEq/L     Glucose Whole Blood - POCT [[id number]]  (Abnormal) Collected:02/03/13 1125     POCT - Glucose Whole blood 211 (H) mg/dL EGBTDVV:61/60/73 7106          Radiology Results (24 Hour)     ** No Results found for the last 24 hours. **                Ashok Cordia M.D. [address] [facility]  [phone]

## 2013-02-05 LAB — GLUCOSE WHOLE BLOOD - POCT
Whole Blood Glucose POCT: 147 mg/dL — ABNORMAL HIGH (ref 70–100)
Whole Blood Glucose POCT: 120 mg/dL — ABNORMAL HIGH (ref 70–100)
Whole Blood Glucose POCT: 131 mg/dL — ABNORMAL HIGH (ref 70–100)
Whole Blood Glucose POCT: 145 mg/dL — ABNORMAL HIGH (ref 70–100)
Whole Blood Glucose POCT: 135 mg/dL — ABNORMAL HIGH (ref 70–100)

## 2013-02-05 LAB — GFR: EGFR: >60

## 2013-02-05 LAB — CBC AND DIFFERENTIAL
Basophils Absolute Automated: 0.01 (ref 0.00–0.20)
Basophils Automated: 0 %
Eosinophils Absolute Automated: 0.01 (ref 0.00–0.70)
Eosinophils Automated: 0 %
Hematocrit: 41.4 % — ABNORMAL LOW (ref 42.0–52.0)
Hgb: 13.4 g/dL (ref 13.0–17.0)
Immature Granulocytes Absolute: 0.02
Immature Granulocytes: 0 %
Lymphocytes Absolute Automated: 2.98 (ref 0.50–4.40)
Lymphocytes Automated: 30 %
MCH: 26.6 pg — ABNORMAL LOW (ref 28.0–32.0)
MCHC: 32.4 g/dL (ref 32.0–36.0)
MCV: 82.1 fL (ref 80.0–100.0)
MPV: 10 fL (ref 9.4–12.3)
Monocytes Absolute Automated: 0.72 (ref 0.00–1.20)
Monocytes: 7 %
Neutrophils Absolute: 6.2 (ref 1.80–8.10)
Neutrophils: 62 %
Nucleated RBC: 0 (ref 0–1)
Platelets: 332 (ref 140–400)
RBC: 5.04 (ref 4.70–6.00)
RDW: 13 % (ref 12–15)
WBC: 9.94 (ref 3.50–10.80)

## 2013-02-05 LAB — BASIC METABOLIC PANEL
BUN: 9 mg/dL (ref 9.0–21.0)
CO2: 24 mEq/L (ref 22–29)
Calcium: 9.1 mg/dL (ref 8.5–10.5)
Chloride: 105 mEq/L (ref 98–107)
Creatinine: 1.1 mg/dL (ref 0.7–1.3)
Glucose: 132 mg/dL — ABNORMAL HIGH (ref 70–100)
Potassium: 3.9 mEq/L (ref 3.5–5.1)
Sodium: 139 mEq/L (ref 136–145)

## 2013-02-05 MED ORDER — CHLORPROMAZINE HCL 25 MG PO TABS
25.0000 mg | ORAL_TABLET | Freq: Every day | ORAL | Status: DC | PRN
Start: 2013-02-05 — End: 2013-02-08
  Administered 2013-02-05: 25 mg via ORAL
  Filled 2013-02-05: qty 1

## 2013-02-05 NOTE — Progress Notes (Signed)
FAMILY MEDICINE PROGRESS NOTE    Date Time: 02/05/2013 11:25 AM  Patient Name: [name]  Attending Physician: Ashok Cordia, MD  Team Contact Info: spectra [phone], pager [phone], office [phone]     Assessment:   Principal Problem:   *Diabetic gastroparesis  Active Problems:   GERD (gastroesophageal reflux disease)   Type 2 diabetes, uncontrolled, with neuropathy   HTN (hypertension)   Vomiting  Resolved Problems:   * No resolved hospital problems. *       [name] is a 37 y.o. male male with a PMH significant for Gastroparesis (multiple prior admissions), uncontrolled IDDM, and HTN who presents to the hospital with intractable nausea and vomiting.      Plan:   # Nausea and Vomiting, likely 2/2 Gastroparesis flare   - Phenergan 12.5 mg IV q6h scheduled   - Reglan 10 mg IV TID scheduled   - Erythromycin 100 IV q8h scheduled   - Carafate 1 gm PO QID when tolerating PO   - Protonix IV   - can consider Ativan q6h with Benadryl IV q6h if n/v not improved.   - will try to use opiates sparingly since they decrease GI motility   - IVFs   - will need to f/u with his GI doctor as an outpatient  - thorazine for hiccups added    # IDDM, uncontrolled   - takes Lantus 36 units qhs, will restart at 22 units with poor PO intake  - will order SSI while poor PO    # AKI, likely pre-renal in setting of dehydration - resolving  - IVFs as noted above   - will monitor     # HTN   - resume home lisinopril 40 mg qd, metoprolol 25 mg BID    Code: FULL     Diet: clears, advance as tolerated    PPX: SCDs, SQH     Dispo - possible d/c home today if better PO, or if not, tomorrow      Subjective     CC: Diabetic gastroparesis    Interval History/24 hour events: no events    HPI/Subjective: Having slow interval improvement in pain and nausea, but still not tolerating much. Had a few sips of ginger ale. No food yet. Feels like he probably needs to stay 1 more night.    Review of Systems:     Denies CP, dyspnea,  F/C    Physical Exam:     VITAL SIGNS   Temp:  [97.3 F (36.3 C)-99 F (37.2 C)] 98.3 F (36.8 C)  Heart Rate:  [100-139] 111   Resp Rate:  [16-18] 16   BP: (138-193)/(74-107) 154/94 mmHg      No intake or output data in the 24 hours ending 02/05/13 1125     Physical Exam  General: awake, alert, oriented x 3; NAD.   HEENT: perrla, eomi, sclera anicteric oropharynx clear without lesions, MMM   Neck: supple, no lymphadenopathy, no thyromegaly, no JVD, no carotid bruits   Cardiovascular: RRR, no murmurs, rubs or gallops   Lungs: clear to auscultation bilaterally, without wheezing, rhonchi, or rales   Abdomen: soft, epigastric mildly TTP (improving), nondistended; no palpable masses, no hepatosplenomegaly, normoactive bowel sounds, no rebound or guarding   Extremities: 2+ pitting edema BLE   Neuro: cranial nerves grossly intact, strength 5/5 in upper and lower extremities, sensation intact,   Skin: no rashes or lesions noted      Meds:  Medications were reviewed:    Labs:     Labs (last 24 hours):  Results     Procedure Component Value Units Date/Time    Glucose Whole Blood - POCT [[id number]]  (Abnormal) Collected:02/05/13 0740     POCT - Glucose Whole blood 120 (H) mg/dL WUJWJXB:14/78/29 5621    Basic Metabolic Panel [[id number]]  (Abnormal) Collected:02/05/13 0503    Specimen Information:Blood Updated:02/05/13 0605     Glucose 132 (H) mg/dL      BUN 9.0 mg/dL      Creatinine 1.1 mg/dL      CALCIUM 9.1 mg/dL      Sodium 962 mEq/L      Potassium 3.9 mEq/L      Chloride 105 mEq/L      CO2 24 mEq/L     GFR [[id number]] Collected:02/05/13 0503     EGFR >60.0 Updated:02/05/13 0605    CBC with differential [[id number]]  (Abnormal) Collected:02/05/13 0503    Specimen Information:Blood / Blood Updated:02/05/13 0531     WBC 9.94      RBC 5.04      Hgb 13.4 g/dL      Hematocrit 66.4 (L) %      MCV 82.1 fL      MCH 26.6 (L) pg      MCHC 32.4 g/dL      RDW 13 %      Platelets 332      MPV 10.0 fL      Neutrophils 62 %       Lymphocytes Automated 30 %      Monocytes 7 %      Eosinophils Automated 0 %      Basophils Automated 0 %      Immature Granulocyte 0 %      Nucleated RBC 0      Neutrophils Absolute 6.20      Abs Lymph Automated 2.98      Abs Mono Automated 0.72      Abs Eos Automated 0.01      Absolute Baso Automated 0.01      Absolute Immature Granulocyte 0.02     Glucose Whole Blood - POCT [[id number]]  (Abnormal) Collected:02/05/13 0314     POCT - Glucose Whole blood 147 (H) mg/dL DGLOVFI:43/32/95 1884    Glucose Whole Blood - POCT [[id number]]  (Abnormal) Collected:02/04/13 2107     POCT - Glucose Whole blood 182 (H) mg/dL WFUXNAT:55/73/22 0254    Glucose Whole Blood - POCT [[id number]]  (Abnormal) Collected:02/04/13 1614     POCT - Glucose Whole blood 194 (H) mg/dL GBTDVVO:16/07/37 1062    Glucose Whole Blood - POCT [[id number]]  (Abnormal) Collected:02/04/13 1118     POCT - Glucose Whole blood 179 (H) mg/dL OJJKKXF:81/82/99 3716            Imaging (last 24 hours), reviewed and are significant for:  No results found.      Signed by:   Ernst Spell, MD  [facility] PGY-2  [phone]  Spectra *[phone]

## 2013-02-05 NOTE — Plan of Care (Signed)
Pt A/ox4 follows commands C/o N/V medicated PRN and schedule med. Ambulated independently. Un able tolerate in take  Due to N/V. Abdominal pain IV Dilaudid PRN given. Call bell with in reach pt safety and comfort will continue monitor.

## 2013-02-05 NOTE — Progress Notes (Signed)
rn assessment done patient is c/o nausea and vomiting c/o abo pain medicated with prn med's as needed  Will continue to monitor patient  safety and needs

## 2013-02-05 NOTE — Progress Notes (Signed)
Attending Progress Note    A/P: I agree with the exam, assessment and plan as per Dr. Blenda Nicely this AM.   Pt improved but still not ready to go home.   DM stable.  Will continue hydration and antiemetics and likely discharge tomorrow.       S:Pt seen and examined concurrently today with Dr. Blenda Nicely.  Chart reviewed.  Case discussed in detail.  No chest pain.  No SOB.  C/o hiccups.       Patient Active Problem List   Diagnosis   . GERD (gastroesophageal reflux disease)   . Gastroparesis   . Dehydration, moderate   . Gastritis without bleeding   . Type 2 diabetes, uncontrolled, with neuropathy   . Hypertension   . Nausea & vomiting   . Intractable vomiting   . HTN (hypertension)   . Diabetic gastroparesis   . Intractable nausea and vomiting   . Vomiting         Current Facility-Administered Medications   Medication Dose Route Frequency Last Rate Last Dose   . 0.9%  NaCl infusion   Intravenous Continuous 125 mL/hr at 02/05/13 1015     . dextrose (GLUCOSE) 40 % oral gel 15 g  15 g Oral PRN       . dextrose 50 % bolus 25 mL  25 mL Intravenous PRN       . erythromycin (ERYTHROCIN) 100 mg in sodium chloride 0.9 % 250 mL IVPB  100 mg Intravenous Q8H 250 mL/hr at 02/05/13 0442 100 mg at 02/05/13 0442   . glucagon (rDNA) (GLUCAGEN) injection 1 mg  1 mg Intramuscular PRN       . heparin (porcine) injection 5,000 Units  5,000 Units Subcutaneous Q8H SCH   5,000 Units at 02/05/13 1610   . HYDROmorphone (DILAUDID) injection 0.5 mg  0.5 mg Intravenous Q2H PRN   0.5 mg at 02/05/13 0819   . insulin aspart (NovoLOG) injection 1-8 Units  1-8 Units Subcutaneous PRN   1 Units at 02/04/13 2141   . insulin glargine (LANTUS) injection 22 Units  22 Units Subcutaneous QHS   22 Units at 02/04/13 2141   . ketorolac (TORADOL) injection 30 mg  30 mg Intravenous Once       . lisinopril (PRINIVIL,ZESTRIL) tablet 40 mg  40 mg Oral Daily   40 mg at 02/05/13 1016   . metoclopramide (REGLAN) injection 10 mg  10 mg Intravenous Q8H SCH   10 mg at  02/05/13 1011   . metoprolol (LOPRESSOR) tablet 25 mg  25 mg Oral Q12H SCH   25 mg at 02/05/13 1018   . naloxone (NARCAN) injection 0.2 mg  0.2 mg Intravenous PRN       . ondansetron (ZOFRAN) injection 4 mg  4 mg Intravenous Q8H PRN   4 mg at 02/05/13 0819   . pantoprazole (PROTONIX) injection 40 mg  40 mg Intravenous Daily   40 mg at 02/05/13 1011   . promethazine (PHENERGAN) injection 12.5 mg  12.5 mg Intravenous Q6H SCH   12.5 mg at 02/05/13 9604       O: Patient is alert and non-toxic.   Heart: Regular   Abdomen: minimal epigastric tenderness   Ext: No edema   Mental status: alert         Filed Vitals:    02/05/13 0741   BP: 154/94   Pulse: 111   Temp: 98.3 F (36.8 C)   Resp: 16   SpO2: 96%  No intake or output data in the 24 hours ending 02/05/13 1117       Results     Procedure Component Value Units Date/Time    Glucose Whole Blood - POCT [[id number]]  (Abnormal) Collected:02/05/13 0740     POCT - Glucose Whole blood 120 (H) mg/dL WUJWJXB:14/78/29 5621    Basic Metabolic Panel [[id number]]  (Abnormal) Collected:02/05/13 0503    Specimen Information:Blood Updated:02/05/13 0605     Glucose 132 (H) mg/dL      BUN 9.0 mg/dL      Creatinine 1.1 mg/dL      CALCIUM 9.1 mg/dL      Sodium 962 mEq/L      Potassium 3.9 mEq/L      Chloride 105 mEq/L      CO2 24 mEq/L     GFR [[id number]] Collected:02/05/13 0503     EGFR >60.0 Updated:02/05/13 0605    CBC with differential [[id number]]  (Abnormal) Collected:02/05/13 0503    Specimen Information:Blood / Blood Updated:02/05/13 0531     WBC 9.94      RBC 5.04      Hgb 13.4 g/dL      Hematocrit 66.4 (L) %      MCV 82.1 fL      MCH 26.6 (L) pg      MCHC 32.4 g/dL      RDW 13 %      Platelets 332      MPV 10.0 fL      Neutrophils 62 %      Lymphocytes Automated 30 %      Monocytes 7 %      Eosinophils Automated 0 %      Basophils Automated 0 %      Immature Granulocyte 0 %      Nucleated RBC 0      Neutrophils Absolute 6.20      Abs Lymph Automated 2.98      Abs Mono  Automated 0.72      Abs Eos Automated 0.01      Absolute Baso Automated 0.01      Absolute Immature Granulocyte 0.02     Glucose Whole Blood - POCT [[id number]]  (Abnormal) Collected:02/05/13 0314     POCT - Glucose Whole blood 147 (H) mg/dL DGLOVFI:43/32/95 1884    Glucose Whole Blood - POCT [[id number]]  (Abnormal) Collected:02/04/13 2107     POCT - Glucose Whole blood 182 (H) mg/dL WFUXNAT:55/73/22 0254    Glucose Whole Blood - POCT [[id number]]  (Abnormal) Collected:02/04/13 1614     POCT - Glucose Whole blood 194 (H) mg/dL GBTDVVO:16/07/37 1062    Glucose Whole Blood - POCT [[id number]]  (Abnormal) Collected:02/04/13 1118     POCT - Glucose Whole blood 179 (H) mg/dL OJJKKXF:81/82/99 3716          Radiology Results (24 Hour)     ** No Results found for the last 24 hours. **                Ashok Cordia M.D. [address] [facility]  [phone]

## 2013-02-06 LAB — CBC AND DIFFERENTIAL
Basophils Absolute Automated: 0.02 (ref 0.00–0.20)
Basophils Automated: 0 %
Eosinophils Absolute Automated: 0.01 (ref 0.00–0.70)
Eosinophils Automated: 0 %
Hematocrit: 37.1 % — ABNORMAL LOW (ref 42.0–52.0)
Hgb: 12.3 g/dL — ABNORMAL LOW (ref 13.0–17.0)
Immature Granulocytes Absolute: 0.02
Immature Granulocytes: 0 %
Lymphocytes Absolute Automated: 1.81 (ref 0.50–4.40)
Lymphocytes Automated: 27 %
MCH: 27.2 pg — ABNORMAL LOW (ref 28.0–32.0)
MCHC: 33.2 g/dL (ref 32.0–36.0)
MCV: 82.1 fL (ref 80.0–100.0)
MPV: 9.9 fL (ref 9.4–12.3)
Monocytes Absolute Automated: 0.48 (ref 0.00–1.20)
Monocytes: 7 %
Neutrophils Absolute: 4.28 (ref 1.80–8.10)
Neutrophils: 65 %
Nucleated RBC: 0 (ref 0–1)
Platelets: 280 (ref 140–400)
RBC: 4.52 — ABNORMAL LOW (ref 4.70–6.00)
RDW: 12 % (ref 12–15)
WBC: 6.62 (ref 3.50–10.80)

## 2013-02-06 LAB — BASIC METABOLIC PANEL
BUN: 10 mg/dL (ref 9.0–21.0)
CO2: 25 mEq/L (ref 22–29)
Calcium: 8.6 mg/dL (ref 8.5–10.5)
Chloride: 104 mEq/L (ref 98–107)
Creatinine: 1.2 mg/dL (ref 0.7–1.3)
Glucose: 156 mg/dL — ABNORMAL HIGH (ref 70–100)
Potassium: 4.3 mEq/L (ref 3.5–5.1)
Sodium: 139 mEq/L (ref 136–145)

## 2013-02-06 LAB — GLUCOSE WHOLE BLOOD - POCT
Whole Blood Glucose POCT: 139 mg/dL — ABNORMAL HIGH (ref 70–100)
Whole Blood Glucose POCT: 140 mg/dL — ABNORMAL HIGH (ref 70–100)
Whole Blood Glucose POCT: 166 mg/dL — ABNORMAL HIGH (ref 70–100)
Whole Blood Glucose POCT: 150 mg/dL — ABNORMAL HIGH (ref 70–100)
Whole Blood Glucose POCT: 166 mg/dL — ABNORMAL HIGH (ref 70–100)

## 2013-02-06 LAB — GFR: EGFR: >60

## 2013-02-06 MED ORDER — HYDROCHLOROTHIAZIDE 12.5 MG PO TABS
12.5000 mg | ORAL_TABLET | Freq: Every day | ORAL | Status: DC
Start: 2013-02-06 — End: 2013-02-08
  Administered 2013-02-06 – 2013-02-08 (×3): 12.5 mg via ORAL
  Filled 2013-02-06 (×3): qty 1

## 2013-02-06 MED ORDER — HYDROMORPHONE HCL PF 1 MG/ML IJ SOLN
0.2000 mg | INTRAMUSCULAR | Status: DC | PRN
Start: 2013-02-06 — End: 2013-02-07
  Administered 2013-02-06 – 2013-02-07 (×5): 0.2 mg via INTRAVENOUS
  Filled 2013-02-06 (×5): qty 1

## 2013-02-06 MED ORDER — SODIUM CHLORIDE 0.45 % IV SOLN
INTRAVENOUS | Status: DC
Start: 2013-02-06 — End: 2013-02-08

## 2013-02-06 MED ORDER — ONDANSETRON HCL 4 MG/2ML IJ SOLN
4.0000 mg | Freq: Once | INTRAMUSCULAR | Status: AC
Start: 2013-02-06 — End: 2013-02-06
  Administered 2013-02-06: 4 mg via INTRAVENOUS
  Filled 2013-02-06: qty 2

## 2013-02-06 NOTE — Progress Notes (Signed)
NUTRITION:  Reason for Assessment: Gastroparesis; liquid diet     Assessment: [name] is a(n) 37 y.o. male with hx gastroparesis, IDDM, HTN, p/w intractable N/V likely 2/2 gastroparesis flare (phenergan, reglan, erythromycin, carafate, protonix, consider ativan w/ benadryl IV)  Labs: Glu 156 / POCT Glu 166, A1C 9.6  Meds: erythromycin, HCTZ, lantus, reglan, protonix, phenergan, IVF @ 125 ml/hr     Anthropometrics:  Height: 190.5 cm (6\' 3" )  Weight: 127.007 kg (280 lb)  Body mass index is 35.00 kg/(m^2).    Nutrition Goals: 2200-2400 kcals, 120-130 gm protein, fluids per MD    Current Diet Order  Orders Placed This Encounter   Procedures   . Diet full liquid     Nutrition Diagnosis:   Impaired nutrient utilization r/t gastroparesis, uncontrolled DM a/e/b A1C 9.6, pt admitted with intractable N/V 2/2 gastroparesis flare, on gastric motility meds for mgmt     Intervention / Recs:  On full liquid diet at this time. Monitor diet advancement (suggest low residue/low fat diet if better tolerated to avoid delayed gastric emptying)     Adjust insulin regime as needed - desire for adequate glycemic control in view of gastroparesis     Continue anti-nausea meds, gastric motility agents as needed, IVF for hydration     Rec try oral supplements to maximize po intake - Ensure Clear and/or Ensure Plus with meals    If gastroparesis severe, consider enteral feeds if within med tx plan (e.g. J-tube feeds to bypass stomach)    Goals: Goal to meet 100% nutr needs by next RD intervention     Learning Needs: No    Monitor/Eval:  Monitor nutr support goals, labs, GI, med tx plan    Georgina Pillion, RD; Philis Kendall [phone]

## 2013-02-06 NOTE — Progress Notes (Signed)
Attending Progress Note    A/P: Pt still unable to hold down food.   Pain continues but better.  On low dose Dilaudid.  Will continue pro-motility agents.   Start low dose HCTZ for the blood pressure.  Diabetes seems stable.  Monitor and discharge home when able to take PO.       S:Pt seen and examined.  Chart reviewed.  Case discussed in detail with Dr. Blenda Nicely.   No fever, chest pain, SOB, or leg pain.   Still with nausea and unable to take much orally.   Abdominal pain a bit less.  Frustrated with slow progress.        Patient Active Problem List   Diagnosis   . GERD (gastroesophageal reflux disease)   . Gastroparesis   . Dehydration, moderate   . Gastritis without bleeding   . Type 2 diabetes, uncontrolled, with neuropathy   . Hypertension   . Nausea & vomiting   . Intractable vomiting   . HTN (hypertension)   . Diabetic gastroparesis   . Intractable nausea and vomiting   . Vomiting         Current Facility-Administered Medications   Medication Dose Route Frequency Last Rate Last Dose   . 0.9%  NaCl infusion   Intravenous Continuous 125 mL/hr at 02/05/13 1823     . chlorproMAZINE (THORAZINE) tablet 25 mg  25 mg Oral QD PRN   25 mg at 02/05/13 1424   . dextrose (GLUCOSE) 40 % oral gel 15 g  15 g Oral PRN       . dextrose 50 % bolus 25 mL  25 mL Intravenous PRN       . erythromycin (ERYTHROCIN) 100 mg in sodium chloride 0.9 % 250 mL IVPB  100 mg Intravenous Q8H 250 mL/hr at 02/06/13 0642 100 mg at 02/06/13 0642   . glucagon (rDNA) (GLUCAGEN) injection 1 mg  1 mg Intramuscular PRN       . heparin (porcine) injection 5,000 Units  5,000 Units Subcutaneous Q8H SCH   5,000 Units at 02/06/13 0635   . hydrochlorothiazide (HYDRODIURIL) tablet 12.5 mg  12.5 mg Oral Daily       . HYDROmorphone (DILAUDID) injection 0.2 mg  0.2 mg Intravenous Q2H PRN       . insulin aspart (NovoLOG) injection 1-8 Units  1-8 Units Subcutaneous PRN   1 Units at 02/04/13 2141   . insulin glargine (LANTUS) injection 22 Units  22 Units  Subcutaneous QHS   22 Units at 02/05/13 2154   . ketorolac (TORADOL) injection 30 mg  30 mg Intravenous Once       . lisinopril (PRINIVIL,ZESTRIL) tablet 40 mg  40 mg Oral Daily   40 mg at 02/05/13 1016   . metoclopramide (REGLAN) injection 10 mg  10 mg Intravenous Q8H SCH   10 mg at 02/06/13 0318   . metoprolol (LOPRESSOR) tablet 25 mg  25 mg Oral Q12H SCH   25 mg at 02/05/13 1018   . naloxone (NARCAN) injection 0.2 mg  0.2 mg Intravenous PRN       . ondansetron (ZOFRAN) injection 4 mg  4 mg Intravenous Q8H PRN   4 mg at 02/05/13 2117   . pantoprazole (PROTONIX) injection 40 mg  40 mg Intravenous Daily   40 mg at 02/05/13 1011   . promethazine (PHENERGAN) injection 12.5 mg  12.5 mg Intravenous Q6H SCH   12.5 mg at 02/06/13 0635   . [DISCONTINUED] HYDROmorphone (DILAUDID) injection 0.5  mg  0.5 mg Intravenous Q2H PRN   0.5 mg at 02/06/13 0700       O: Patient is alert and non-toxic.   Heart: Regular   Lungs: CTA bilat   Abdomen: Soft   Ext: No edema    Mental status: Alert         Filed Vitals:    02/06/13 0702   BP: 181/94   Pulse: 113   Temp: 98.8 F (37.1 C)   Resp: 16   SpO2: 97%       No intake or output data in the 24 hours ending 02/06/13 1015       Results     Procedure Component Value Units Date/Time    Basic Metabolic Panel [[id number]]  (Abnormal) Collected:02/06/13 0632    Specimen Information:Blood Updated:02/06/13 0744     Glucose 156 (H) mg/dL      BUN 08.6 mg/dL      Creatinine 1.2 mg/dL      CALCIUM 8.6 mg/dL      Sodium 578 mEq/L      Potassium 4.3 mEq/L      Chloride 104 mEq/L      CO2 25 mEq/L     GFR [[id number]] Collected:02/06/13 0632     EGFR >60.0 Updated:02/06/13 0744    CBC with differential [[id number]]  (Abnormal) Collected:02/06/13 0632    Specimen Information:Blood / Blood Updated:02/06/13 0735     WBC 6.62      RBC 4.52 (L)      Hgb 12.3 (L) g/dL      Hematocrit 46.9 (L) %      MCV 82.1 fL      MCH 27.2 (L) pg      MCHC 33.2 g/dL      RDW 12 %      Platelets 280      MPV 9.9 fL       Neutrophils 65 %      Lymphocytes Automated 27 %      Monocytes 7 %      Eosinophils Automated 0 %      Basophils Automated 0 %      Immature Granulocyte 0 %      Nucleated RBC 0      Neutrophils Absolute 4.28      Abs Lymph Automated 1.81      Abs Mono Automated 0.48      Abs Eos Automated 0.01      Absolute Baso Automated 0.02      Absolute Immature Granulocyte 0.02     Glucose Whole Blood - POCT [[id number]]  (Abnormal) Collected:02/06/13 0659     POCT - Glucose Whole blood 140 (H) mg/dL KGMWNUU:72/53/66 4403    Glucose Whole Blood - POCT [[id number]]  (Abnormal) Collected:02/06/13 0305     POCT - Glucose Whole blood 139 (H) mg/dL OVFIEPP:29/51/88 4166    Glucose Whole Blood - POCT [[id number]]  (Abnormal) Collected:02/05/13 2111     POCT - Glucose Whole blood 135 (H) mg/dL XNATFTD:32/20/25 4270    Glucose Whole Blood - POCT [[id number]]  (Abnormal) Collected:02/05/13 1643     POCT - Glucose Whole blood 145 (H) mg/dL DVVOHYW:73/71/06 2694    Glucose Whole Blood - POCT [[id number]]  (Abnormal) Collected:02/05/13 1148     POCT - Glucose Whole blood 131 (H) mg/dL KKXFGHW:29/93/71 6967          Radiology Results (24 Hour)     ** No Results found for the last 24 hours. **  Celene Skeen M.D. [id number]  [facility]  [phone]

## 2013-02-06 NOTE — Progress Notes (Signed)
assessment done patient is c/o nausea and vomiting c/o abo pain medicated with prn med's as needed Will continue to monitor patient safety and needs

## 2013-02-07 LAB — BASIC METABOLIC PANEL
BUN: 9 mg/dL (ref 9.0–21.0)
CO2: 25 mEq/L (ref 22–29)
Calcium: 9.1 mg/dL (ref 8.5–10.5)
Chloride: 100 mEq/L (ref 98–107)
Creatinine: 1.2 mg/dL (ref 0.7–1.3)
Glucose: 145 mg/dL — ABNORMAL HIGH (ref 70–100)
Potassium: 3.8 mEq/L (ref 3.5–5.1)
Sodium: 137 mEq/L (ref 136–145)

## 2013-02-07 LAB — GFR: EGFR: >60

## 2013-02-07 LAB — GLUCOSE WHOLE BLOOD - POCT
Whole Blood Glucose POCT: 152 mg/dL — ABNORMAL HIGH (ref 70–100)
Whole Blood Glucose POCT: 145 mg/dL — ABNORMAL HIGH (ref 70–100)
Whole Blood Glucose POCT: 150 mg/dL — ABNORMAL HIGH (ref 70–100)
Whole Blood Glucose POCT: 139 mg/dL — ABNORMAL HIGH (ref 70–100)
Whole Blood Glucose POCT: 141 mg/dL — ABNORMAL HIGH (ref 70–100)

## 2013-02-07 MED ORDER — HYDRALAZINE HCL 20 MG/ML IJ SOLN
10.0000 mg | Freq: Four times a day (QID) | INTRAMUSCULAR | Status: DC | PRN
Start: 2013-02-07 — End: 2013-02-08

## 2013-02-07 MED ORDER — POLYETHYLENE GLYCOL 3350 17 G PO PACK
17.0000 g | PACK | Freq: Two times a day (BID) | ORAL | Status: DC
Start: 2013-02-07 — End: 2013-02-08
  Filled 2013-02-07 (×2): qty 1

## 2013-02-07 MED ORDER — HYDROMORPHONE HCL PF 1 MG/ML IJ SOLN
0.4000 mg | INTRAMUSCULAR | Status: DC | PRN
Start: 2013-02-07 — End: 2013-02-08
  Administered 2013-02-07 – 2013-02-08 (×9): 0.4 mg via INTRAVENOUS
  Filled 2013-02-07 (×9): qty 1

## 2013-02-07 MED ORDER — LORAZEPAM 2 MG/ML IJ SOLN
1.0000 mg | Freq: Four times a day (QID) | INTRAMUSCULAR | Status: DC
Start: 2013-02-07 — End: 2013-02-08
  Administered 2013-02-07 – 2013-02-08 (×5): 1 mg via INTRAVENOUS
  Filled 2013-02-07 (×5): qty 1

## 2013-02-07 MED ORDER — DIPHENHYDRAMINE HCL 50 MG/ML IJ SOLN
25.0000 mg | Freq: Every evening | INTRAMUSCULAR | Status: DC
Start: 2013-02-07 — End: 2013-02-08
  Administered 2013-02-07: 25 mg via INTRAVENOUS
  Filled 2013-02-07: qty 1

## 2013-02-07 NOTE — Consults (Signed)
[facility]  CONSULTATION NOTE    Date Time: 02/07/2013 2:59 PM  Patient Name: [name]  Requesting Physician: Ashok Cordia, MD       Reason for Consultation:   Gastroparesis, n/v     Assessment and Plan:    Assessment:   1. Acute exacerbation of diabetic gastroparesis-- recurrent problem  2. Abdominal pain with N/V, 2/2 #1.   3. GERD   4. Chronic constipation   5. DMM2, insulin-dependant, poorly-controlled.     Plan:   1. Continue scheduled Erythromycin and Reglan.   2. Tight glucose control.   3. Continue PPI. Restart Carafate.   4. Continue full liquid diet. Advance as tolerated to frequent small low fat low residue meals.   5. Avoid narcotics which will slow GI motility further. Consider a trial of Ativan and Benadryl.   6. Outpatient GI f/u with Dr. Jens Som.   7. Miralax 17 grams BID. Consider Relistor as an outpatient.   8. Will see the patient again on Monday. If there are urgent issues over the weekend, please call the GI PA at [phone].      History:   [name] is a 37 y.o. male with diabetic gastroparesis who presents to the hospital on 02/02/2013 with recurrent abdominal pain, nausea, and vomiting. He was last discharged earlier this month with similar symptoms. He continues to complain of significant nausea and vomiting and is requiring and increased dose of Dilaudid due to abdominal pain. He has not had a bowel movement in about two days.    Past Medical History:     Past Medical History   Diagnosis Date   . Diabetes    . Hypertensive disorder    . Gastroparesis        Past Surgical History:     Past Surgical History   Procedure Date   . Cholecystectomy    . Knee arthroscopy w/ acl reconstruction      left   . Egd 08/08/2012     Procedure: EGD;  Surgeon: [name], MD;  Location: [facility];  Service: Gastroenterology;  Laterality: N/A;  w\bx       Family History:     Family History   Problem Relation Age of Onset   . Diabetes  Father    . Diabetes Maternal Grandmother    . Diabetes Paternal Grandmother        Social History:     History     Social History   . Marital Status: Single     Spouse Name: N/A     Number of Children: N/A   . Years of Education: N/A     Social History Main Topics   . Smoking status: Never Smoker    . Smokeless tobacco: Never Used   . Alcohol Use: Yes      Comment: "occasional"   . Drug Use: No   . Sexually Active: Not on file     Other Topics Concern   . Not on file     Social History Narrative   . No narrative on file       Allergies:   No Known Allergies    Medications:     Current Facility-Administered Medications   Medication Dose Route Frequency   . diphenhydrAMINE  25 mg Intravenous QHS   . erythromycin  100 mg Intravenous Q8H   . heparin (porcine)  5,000 Units Subcutaneous Q8H SCH   . hydrochlorothiazide  12.5  mg Oral Daily   . insulin glargine  22 Units Subcutaneous QHS   . ketorolac  30 mg Intravenous Once   . lisinopril  40 mg Oral Daily   . LORazepam  1 mg Intravenous Q6H SCH   . metoclopramide  10 mg Intravenous Q8H SCH   . metoprolol  25 mg Oral Q12H SCH   . [COMPLETED] ondansetron  4 mg Intravenous Once   . pantoprazole  40 mg Intravenous Daily   . promethazine  12.5 mg Intravenous Q6H SCH       Review of Systems:   Complete 12 Point ROS negative or normal except those mentioned in HPI above.    Physical Exam:     Filed Vitals:    02/07/13 1128   BP: 168/95   Pulse: 99   Temp: 97.9 F (36.6 C)   Resp: 16   SpO2: 94%       General appearance: Well developed, well nourished, appears stated age and in NAD  Eyes: Sclera anicteric, pink conjunctivae, no ptosis  ENMT: mucous membranes moist, nose and ears appear normal.  Oropharynx clear.  Chest: Non labored respirations, no audible wheezing, no clubbing or cyanosis  CV:  Regular rate and rhythm, no JVD, no LE edema  Abdomen: soft, non-tender, non-distended, no masses or organomegaly  Skin: Normal color and turgor, no rashes, no suspicious skin lesions  noted  Neuro: CN II-XII grossly intact.  No gross movement disorders noted.  Mental status: Appropriate affect, alert and oriented x 3    Labs Reviewed:     Recent Labs   [facility] 02/06/13 0632 02/05/13 0503    WBC 6.62 9.94    HGB 12.3* 13.4    HCT 37.1* 41.4*    PLT 280 332    MCV 82.1 82.1       Recent Labs   Basename 02/07/13 0543 02/06/13 0632    NA 137 139    K 3.8 4.3    CL 100 104    CO2 25 25    BUN 9.0 10.0    CREAT 1.2 1.2    GLU 145* 156*    CA 9.1 8.6    MG -- --    PHOS -- --       No results found for this basename: AST:2,ALT:2,ALKPHOS:2,BILITOTAL:2,BILIDIRECT:2,PROT:2,ALB:2 in the last 72 hours    No results found for this basename: PTT:2,PT:2,INR:2 in the last 72 hours     Radiology:   Radiological Procedure reviewed:  AXR 02/02/13: Moderate retained stool in the colon.    Endoscopy:    EGD 08/08/12 ([facility]): gastritis

## 2013-02-07 NOTE — Progress Notes (Signed)
Patient alert x4, follows commands, compliant with safety precautions in place, independent OOB, walked unit x1 and to bathroom as needed to void and clean up. Green mucous-like emesis with frequent bouts of vomiting. Pain controlled with PRN Dilaudid 0.4 mg every 2-3 hours. Diminished bowel sounds.

## 2013-02-07 NOTE — Plan of Care (Signed)
Pt a/o x 4, complains of abdominal pain, pain medication administered as ordered. Pt states pain medication not relieving pain to acceptable level, MD informed, medication dosage increased, pt appears to be resting comfortably. Pt nauseated throughout shift and had several episodes of emesis, MD aware, continue nausea medications and IV fluids. Pt steady on feet out of bed, no difficulty swallowing observed, will continue to monitor. Pt refused vital signs at 2400.

## 2013-02-07 NOTE — Progress Notes (Signed)
Attending Progress Note    A/P: I agree with the exam, assessment and plan as per Dr. Blenda Nicely this AM.   Progressing very slowly.  DM stable.  Will ask GI to comment on any additional treatments.   Continue fluids, CLD and pro motility agents.        S:Pt seen and examined concurrently today with Dr. Blenda Nicely.  Chart reviewed.  Case discussed in detail.  No headache, CP or urinary sx.  C/O abdominal ache which is baseline.   Ambulating in hall.   Not able to keep fluids down.       Patient Active Problem List   Diagnosis   . GERD (gastroesophageal reflux disease)   . Gastroparesis   . Dehydration, moderate   . Gastritis without bleeding   . Type 2 diabetes, uncontrolled, with neuropathy   . Hypertension   . Nausea & vomiting   . Intractable vomiting   . HTN (hypertension)   . Diabetic gastroparesis   . Intractable nausea and vomiting   . Vomiting         Current Facility-Administered Medications   Medication Dose Route Frequency Last Rate Last Dose   . 0.45% NaCl infusion   Intravenous Continuous 50 mL/hr at 02/06/13 1700     . chlorproMAZINE (THORAZINE) tablet 25 mg  25 mg Oral QD PRN   25 mg at 02/05/13 1424   . dextrose (GLUCOSE) 40 % oral gel 15 g  15 g Oral PRN       . dextrose 50 % bolus 25 mL  25 mL Intravenous PRN       . diphenhydrAMINE (BENADRYL) injection 25 mg  25 mg Intravenous QHS       . erythromycin (ERYTHROCIN) 100 mg in sodium chloride 0.9 % 250 mL IVPB  100 mg Intravenous Q8H 250 mL/hr at 02/07/13 0539 100 mg at 02/07/13 0539   . glucagon (rDNA) (GLUCAGEN) injection 1 mg  1 mg Intramuscular PRN       . heparin (porcine) injection 5,000 Units  5,000 Units Subcutaneous Q8H SCH   5,000 Units at 02/07/13 0539   . hydrALAZINE (APRESOLINE) injection 10 mg  10 mg Intravenous Q6H PRN       . hydrochlorothiazide (HYDRODIURIL) tablet 12.5 mg  12.5 mg Oral Daily   12.5 mg at 02/07/13 1049   . HYDROmorphone (DILAUDID) injection 0.4 mg  0.4 mg Intravenous Q2H PRN   0.4 mg at 02/07/13 1049   . insulin aspart  (NovoLOG) injection 1-8 Units  1-8 Units Subcutaneous PRN   1 Units at 02/06/13 1329   . insulin glargine (LANTUS) injection 22 Units  22 Units Subcutaneous QHS   22 Units at 02/06/13 2124   . ketorolac (TORADOL) injection 30 mg  30 mg Intravenous Once       . lisinopril (PRINIVIL,ZESTRIL) tablet 40 mg  40 mg Oral Daily   40 mg at 02/07/13 1049   . LORazepam (ATIVAN) injection 1 mg  1 mg Intravenous Q6H SCH       . metoclopramide (REGLAN) injection 10 mg  10 mg Intravenous Q8H SCH   10 mg at 02/07/13 1049   . metoprolol (LOPRESSOR) tablet 25 mg  25 mg Oral Q12H SCH   25 mg at 02/07/13 1049   . naloxone (NARCAN) injection 0.2 mg  0.2 mg Intravenous PRN       . ondansetron (ZOFRAN) injection 4 mg  4 mg Intravenous Q8H PRN   4 mg at 02/07/13 0836   . [  COMPLETED] ondansetron (ZOFRAN) injection 4 mg  4 mg Intravenous Once   4 mg at 02/06/13 2123   . pantoprazole (PROTONIX) injection 40 mg  40 mg Intravenous Daily   40 mg at 02/07/13 1049   . promethazine (PHENERGAN) injection 12.5 mg  12.5 mg Intravenous Q6H SCH   12.5 mg at 02/07/13 0538   . [DISCONTINUED] 0.9%  NaCl infusion   Intravenous Continuous 125 mL/hr at 02/06/13 1333     . [DISCONTINUED] HYDROmorphone (DILAUDID) injection 0.2 mg  0.2 mg Intravenous Q2H PRN   0.2 mg at 02/07/13 0025       O: Patient is alert and non-toxic.   Heart: REgular   Lungs: Clear   Abdomen: soft   Ext: No edema   Mental status: Alert.         Filed Vitals:    02/07/13 1128   BP: 168/95   Pulse: 99   Temp: 97.9 F (36.6 C)   Resp: 16   SpO2: 94%       No intake or output data in the 24 hours ending 02/07/13 1155       Results     Procedure Component Value Units Date/Time    Glucose Whole Blood - POCT [[id number]]  (Abnormal) Collected:02/07/13 1130     POCT - Glucose Whole blood 150 (H) mg/dL ZHYQMVH:84/69/62 9528    Glucose Whole Blood - POCT [[id number]]  (Abnormal) Collected:02/07/13 0726     POCT - Glucose Whole blood 145 (H) mg/dL UVOZDGU:44/03/47 4259    Basic Metabolic Panel  [[id number]]  (Abnormal) Collected:02/07/13 0543    Specimen Information:Blood Updated:02/07/13 0618     Glucose 145 (H) mg/dL      BUN 9.0 mg/dL      Creatinine 1.2 mg/dL      CALCIUM 9.1 mg/dL      Sodium 563 mEq/L      Potassium 3.8 mEq/L      Chloride 100 mEq/L      CO2 25 mEq/L     GFR [[id number]] Collected:02/07/13 0543     EGFR >60.0 Updated:02/07/13 0618    Glucose Whole Blood - POCT [[id number]]  (Abnormal) Collected:02/07/13 0423     POCT - Glucose Whole blood 152 (H) mg/dL YTKZSWF:09/32/35 5732    Glucose Whole Blood - POCT [[id number]]  (Abnormal) Collected:02/06/13 2154     POCT - Glucose Whole blood 166 (H) mg/dL CBJSEGB:15/17/61 6073    Glucose Whole Blood - POCT [[id number]]  (Abnormal) Collected:02/06/13 1641     POCT - Glucose Whole blood 150 (H) mg/dL NIOEVOJ:50/09/38 1829          Radiology Results (24 Hour)     ** No Results found for the last 24 hours. **                Ashok Cordia M.D. [id number] [facility]  [phone]

## 2013-02-07 NOTE — Progress Notes (Signed)
FAMILY MEDICINE PROGRESS NOTE    Date Time: 02/07/2013 11:52 AM  Patient Name: [name]  Attending Physician: Ashok Cordia, MD  Team Contact Info: spectra [phone], pager [phone], office [phone]     Assessment:   Principal Problem:   *Diabetic gastroparesis  Active Problems:   GERD (gastroesophageal reflux disease)   Type 2 diabetes, uncontrolled, with neuropathy   HTN (hypertension)   Vomiting  Resolved Problems:   * No resolved hospital problems. *       ARGELIO GRANIER is a 37 y.o. male male with a PMH significant for Gastroparesis (multiple prior admissions), uncontrolled IDDM, and HTN who presents to the hospital with intractable nausea and vomiting. Still with significant symptoms and slow improvement.    Plan:   # Nausea and Vomiting, 2/2 Gastroparesis flare   - Phenergan 12.5 mg IV q6h scheduled   - Reglan 10 mg IV TID scheduled   - Erythromycin 100 IV q8h scheduled   - Carafate 1 gm PO QID when tolerating PO   - Protonix IV   - added Ativan q6h with Benadryl 25 mg IV qhs  - GI consult today due to lack of progress  - will try to use opiates sparingly since they decrease GI motility   - IVFs   - thorazine for hiccups added    # IDDM, uncontrolled   - takes Lantus 36 units qhs, will restart at 22 units with poor PO intake  - will order SSI while poor PO    # AKI, likely pre-renal in setting of dehydration - resolving  - IVFs as noted above   - will monitor     # HTN, elevated likely due to salt load in IVF which has been reduced  - resume home lisinopril 40 mg qd, metoprolol 25 mg BID    Code: FULL     Diet: clears, advance as tolerated    PPX: SCDs, SQH     Dispo - pending improvement in PO intake. Maybe tomorrow.      Subjective     CC: Diabetic gastroparesis    Interval History/24 hour events: no events    HPI/Subjective: Feels like he hit a wall in his improvement. Still having significant nausea, dry heaving, no tolerance for any PO this morning. Had to have his dilaudid bumped up from 0.2  to 0.4 mg overnight due to abdominal pain.    Review of Systems:     Denies CP, dyspnea, F/C    Physical Exam:     VITAL SIGNS   Temp:  [97.9 F (36.6 C)-98.8 F (37.1 C)] 97.9 F (36.6 C)  Heart Rate:  [98-108] 99   Resp Rate:  [16-18] 16   BP: (168-200)/(95-113) 168/95 mmHg      No intake or output data in the 24 hours ending 02/07/13 1152     Physical Exam  General: awake, alert, oriented x 3; NAD.   HEENT: perrla, eomi, sclera anicteric oropharynx clear without lesions, MMM   Neck: supple, no lymphadenopathy, no thyromegaly, no JVD, no carotid bruits   Cardiovascular: RRR, no murmurs, rubs or gallops   Lungs: clear to auscultation bilaterally, without wheezing, rhonchi, or rales   Abdomen: soft, epigastric mildly TTP, nondistended; no palpable masses, no hepatosplenomegaly, normoactive bowel sounds, no rebound or guarding   Extremities: 2+ pitting edema BLE   Neuro: cranial nerves grossly intact, strength 5/5 in upper and lower extremities, sensation intact,   Skin: no rashes or lesions noted  Meds:     Medications were reviewed:    Labs:     Labs (last 24 hours):  Results     Procedure Component Value Units Date/Time    Glucose Whole Blood - POCT [[id number]]  (Abnormal) Collected:02/07/13 1130     POCT - Glucose Whole blood 150 (H) mg/dL WGNFAOZ:30/86/57 8469    Glucose Whole Blood - POCT [[id number]]  (Abnormal) Collected:02/07/13 0726     POCT - Glucose Whole blood 145 (H) mg/dL KGMWNUU:72/53/66 4403    Basic Metabolic Panel [[id number]]  (Abnormal) Collected:02/07/13 0543    Specimen Information:Blood Updated:02/07/13 0618     Glucose 145 (H) mg/dL      BUN 9.0 mg/dL      Creatinine 1.2 mg/dL      CALCIUM 9.1 mg/dL      Sodium 474 mEq/L      Potassium 3.8 mEq/L      Chloride 100 mEq/L      CO2 25 mEq/L     GFR [[id number]] Collected:02/07/13 0543     EGFR >60.0 Updated:02/07/13 0618    Glucose Whole Blood - POCT [[id number]]  (Abnormal) Collected:02/07/13 0423     POCT - Glucose Whole blood 152 (H) mg/dL  ACZYSAY:30/16/01 0932    Glucose Whole Blood - POCT [[id number]]  (Abnormal) Collected:02/06/13 2154     POCT - Glucose Whole blood 166 (H) mg/dL RKYHCWC:37/62/83 1517    Glucose Whole Blood - POCT [[id number]]  (Abnormal) Collected:02/06/13 1641     POCT - Glucose Whole blood 150 (H) mg/dL GYIRSWN:46/27/03 5009            Imaging (last 24 hours), reviewed and are significant for:  No results found.      Signed by:   Ernst Spell, MD  [facility] PGY-2  [phone]  Spectra [phone]

## 2013-02-08 ENCOUNTER — Observation Stay
Admission: EM | Admit: 2013-02-08 | Discharge: 2013-02-10 | DRG: 074 | Disposition: A | Discharge status: Home / Self Care | Payer: BC Managed Care – PPO | Attending: Family Medicine | Admitting: Family Medicine

## 2013-02-08 ENCOUNTER — Inpatient Hospital Stay: Payer: BC Managed Care – PPO | Admitting: Family Medicine

## 2013-02-08 DIAGNOSIS — K5900 Constipation, unspecified: Secondary | ICD-10-CM | POA: Diagnosis present

## 2013-02-08 DIAGNOSIS — R109 Unspecified abdominal pain: Secondary | ICD-10-CM

## 2013-02-08 DIAGNOSIS — E1143 Type 2 diabetes mellitus with diabetic autonomic (poly)neuropathy: Secondary | ICD-10-CM | POA: Diagnosis present

## 2013-02-08 DIAGNOSIS — E860 Dehydration: Secondary | ICD-10-CM | POA: Diagnosis present

## 2013-02-08 DIAGNOSIS — I498 Other specified cardiac arrhythmias: Secondary | ICD-10-CM | POA: Diagnosis present

## 2013-02-08 DIAGNOSIS — N179 Acute kidney failure, unspecified: Secondary | ICD-10-CM | POA: Diagnosis present

## 2013-02-08 DIAGNOSIS — Z833 Family history of diabetes mellitus: Secondary | ICD-10-CM

## 2013-02-08 DIAGNOSIS — E1142 Type 2 diabetes mellitus with diabetic polyneuropathy: Secondary | ICD-10-CM | POA: Diagnosis present

## 2013-02-08 DIAGNOSIS — Z794 Long term (current) use of insulin: Secondary | ICD-10-CM

## 2013-02-08 DIAGNOSIS — K219 Gastro-esophageal reflux disease without esophagitis: Secondary | ICD-10-CM | POA: Diagnosis present

## 2013-02-08 DIAGNOSIS — K3184 Gastroparesis: Principal | ICD-10-CM | POA: Diagnosis present

## 2013-02-08 DIAGNOSIS — E1149 Type 2 diabetes mellitus with other diabetic neurological complication: Secondary | ICD-10-CM | POA: Diagnosis present

## 2013-02-08 DIAGNOSIS — IMO0002 Reserved for concepts with insufficient information to code with codable children: Secondary | ICD-10-CM | POA: Diagnosis present

## 2013-02-08 DIAGNOSIS — R112 Nausea with vomiting, unspecified: Secondary | ICD-10-CM | POA: Diagnosis present

## 2013-02-08 DIAGNOSIS — I10 Essential (primary) hypertension: Secondary | ICD-10-CM | POA: Diagnosis present

## 2013-02-08 LAB — BASIC METABOLIC PANEL
BUN: 10 mg/dL (ref 9.0–21.0)
BUN: 14 mg/dL (ref 9.0–21.0)
CO2: 28 mEq/L (ref 22–29)
CO2: 24 mEq/L (ref 22–29)
Calcium: 8.9 mg/dL (ref 8.5–10.5)
Calcium: 9.9 mg/dL (ref 8.5–10.5)
Chloride: 102 mEq/L (ref 98–107)
Chloride: 101 mEq/L (ref 98–107)
Creatinine: 1.1 mg/dL (ref 0.7–1.3)
Creatinine: 1.5 mg/dL — ABNORMAL HIGH (ref 0.7–1.3)
Glucose: 102 mg/dL — ABNORMAL HIGH (ref 70–100)
Glucose: 166 mg/dL — ABNORMAL HIGH (ref 70–100)
Potassium: 3.2 mEq/L — ABNORMAL LOW (ref 3.5–5.1)
Potassium: 3.8 mEq/L (ref 3.5–5.1)
Sodium: 139 mEq/L (ref 136–145)
Sodium: 138 mEq/L (ref 136–145)

## 2013-02-08 LAB — CBC AND DIFFERENTIAL
Basophils Absolute Automated: 0.02 (ref 0.00–0.20)
Basophils Automated: 0 %
Eosinophils Absolute Automated: 0.04 (ref 0.00–0.70)
Eosinophils Automated: 0 %
Hematocrit: 43.3 % (ref 42.0–52.0)
Hgb: 14.9 g/dL (ref 13.0–17.0)
Immature Granulocytes Absolute: 0.07 — ABNORMAL HIGH
Immature Granulocytes: 1 %
Lymphocytes Absolute Automated: 1.5 (ref 0.50–4.40)
Lymphocytes Automated: 15 %
MCH: 27.6 pg — ABNORMAL LOW (ref 28.0–32.0)
MCHC: 34.4 g/dL (ref 32.0–36.0)
MCV: 80.2 fL (ref 80.0–100.0)
MPV: 9.4 fL (ref 9.4–12.3)
Monocytes Absolute Automated: 0.76 (ref 0.00–1.20)
Monocytes: 8 %
Neutrophils Absolute: 7.78 (ref 1.80–8.10)
Neutrophils: 76 %
Nucleated RBC: 0 (ref 0–1)
Platelets: 330 (ref 140–400)
RBC: 5.4 (ref 4.70–6.00)
RDW: 13 % (ref 12–15)
WBC: 10.17 (ref 3.50–10.80)

## 2013-02-08 LAB — HEPATIC FUNCTION PANEL
ALT: 22 U/L (ref 0–55)
AST (SGOT): 18 U/L (ref 5–34)
Albumin/Globulin Ratio: 1.2 (ref 0.9–2.2)
Albumin: 4.3 g/dL (ref 3.5–5.0)
Alkaline Phosphatase: 83 U/L (ref 40–150)
Bilirubin Direct: 0.2 mg/dL (ref 0.0–0.5)
Bilirubin Indirect: 0.2 mg/dL (ref 0.0–1.1)
Bilirubin, Total: 0.4 mg/dL (ref 0.2–1.2)
Globulin: 3.5 g/dL (ref 2.0–3.6)
Protein, Total: 7.8 g/dL (ref 6.0–8.3)

## 2013-02-08 LAB — GLUCOSE WHOLE BLOOD - POCT
Whole Blood Glucose POCT: 126 mg/dL — ABNORMAL HIGH (ref 70–100)
Whole Blood Glucose POCT: 95 mg/dL (ref 70–100)
Whole Blood Glucose POCT: 173 mg/dL — ABNORMAL HIGH (ref 70–100)

## 2013-02-08 LAB — LIPASE: Lipase: 88 U/L — ABNORMAL HIGH (ref 8–78)

## 2013-02-08 LAB — MAGNESIUM: Magnesium: 1.9 mg/dL (ref 1.6–2.6)

## 2013-02-08 LAB — GFR
EGFR: >60
EGFR: >60

## 2013-02-08 MED ORDER — HYDROMORPHONE HCL PF 1 MG/ML IJ SOLN
1.0000 mg | Freq: Once | INTRAMUSCULAR | Status: AC
Start: 2013-02-08 — End: 2013-02-08
  Administered 2013-02-08: 1 mg via INTRAVENOUS
  Filled 2013-02-08: qty 1

## 2013-02-08 MED ORDER — ONDANSETRON HCL 4 MG/2ML IJ SOLN
4.0000 mg | Freq: Once | INTRAMUSCULAR | Status: AC
Start: 2013-02-08 — End: 2013-02-08
  Administered 2013-02-08: 4 mg via INTRAVENOUS
  Filled 2013-02-08: qty 2

## 2013-02-08 MED ORDER — SODIUM CHLORIDE 0.9 % IV BOLUS
1000.0000 mL | Freq: Once | INTRAVENOUS | Status: AC
Start: 2013-02-08 — End: 2013-02-09
  Administered 2013-02-09: 1000 mL via INTRAVENOUS

## 2013-02-08 MED ORDER — LORAZEPAM 0.5 MG PO TABS
0.5000 mg | ORAL_TABLET | Freq: Four times a day (QID) | ORAL | Status: DC | PRN
Start: 2013-02-08 — End: 2013-02-20

## 2013-02-08 MED ORDER — PROMETHAZINE HCL 25 MG/ML IJ SOLN
12.5000 mg | Freq: Once | INTRAMUSCULAR | Status: AC
Start: 2013-02-08 — End: 2013-02-09
  Administered 2013-02-09: 12.5 mg via INTRAVENOUS
  Filled 2013-02-08: qty 1

## 2013-02-08 MED ORDER — POTASSIUM CHLORIDE CRYS ER 20 MEQ PO TBCR
40.0000 meq | EXTENDED_RELEASE_TABLET | Freq: Once | ORAL | Status: AC
Start: 2013-02-08 — End: 2013-02-08
  Administered 2013-02-08: 40 meq via ORAL
  Filled 2013-02-08: qty 2

## 2013-02-08 MED ORDER — SODIUM CHLORIDE 0.9 % IV BOLUS
1000.0000 mL | Freq: Once | INTRAVENOUS | Status: AC
Start: 2013-02-08 — End: 2013-02-09
  Administered 2013-02-08: 1000 mL via INTRAVENOUS

## 2013-02-08 MED ORDER — POTASSIUM CHLORIDE 10 MEQ/100ML IV SOLN
10.0000 meq | INTRAVENOUS | Status: DC
Start: 2013-02-08 — End: 2013-02-08

## 2013-02-08 MED ORDER — LORAZEPAM 2 MG/ML IJ SOLN
1.0000 mg | Freq: Once | INTRAMUSCULAR | Status: AC
Start: 2013-02-08 — End: 2013-02-09
  Administered 2013-02-09: 1 mg via INTRAVENOUS
  Filled 2013-02-08: qty 1

## 2013-02-08 NOTE — ED Provider Notes (Signed)
Physician/Midlevel provider first contact with patient: 02/08/13 2336         History     Chief Complaint   Patient presents with   . Abdominal Pain   . d/v      HPI Comments: 37 y.o. M with h/o DM, HTN, gastroparesis s/p cholecystectomy p/w progressively worsening diffuse abd pain a/w v/d, onset a few hours PTA. Pt was just d/c'd today for DM and gastroparesis. Notes that his sxs were improved upon d/c, but returned shortly after he ate chicken soup and crackers earlier today. No other complaints.     Patient is a 37 y.o. male presenting with abdominal pain. The history is provided by the patient.   Abdominal Pain  The primary symptoms of the illness include abdominal pain, vomiting and diarrhea.       Past Medical History   Diagnosis Date   . Diabetes    . Hypertensive disorder    . Gastroparesis        Past Surgical History   Procedure Date   . Cholecystectomy    . Knee arthroscopy w/ acl reconstruction      left   . Egd 08/08/2012     Procedure: EGD;  Surgeon: [name], MD;  Location: [facility];  Service: Gastroenterology;  Laterality: N/A;  w\bx       Family History   Problem Relation Age of Onset   . Diabetes Father    . Diabetes Maternal Grandmother    . Diabetes Paternal Grandmother        Social  History   Substance Use Topics   . Smoking status: Never Smoker    . Smokeless tobacco: Never Used   . Alcohol Use: Yes      Comment: "occasional"       .     No Known Allergies    Current/Home Medications    AMLODIPINE (NORVASC) 5 MG TABLET    Take 2 tablets (10 mg total) by mouth daily.    ERYTHROMYCIN (ERYTHROCIN) 250 MG TABLET    Take by mouth 3 (three) times daily. unknown dose    GABAPENTIN (NEURONTIN) 300 MG CAPSULE    Take 300 mg by mouth nightly.    INSULIN GLARGINE (LANTUS) 100 UNIT/ML INJECTION    Inject 22 Units into the skin nightly. Check am fasting sugars. Increase by 1 unit every morning for readings >120    LISINOPRIL (PRINIVIL,ZESTRIL) 40 MG TABLET    Take 1 tablet (40 mg  total) by mouth daily.    LORAZEPAM (ATIVAN) 0.5 MG TABLET    Take 1 tablet (0.5 mg total) by mouth every 6 (six) hours as needed. PRN cyclic vomiting    METOPROLOL (LOPRESSOR) 25 MG TABLET    Take 1 tablet (25 mg total) by mouth every 12 (twelve) hours.    PANTOPRAZOLE (PROTONIX) 40 MG TABLET    Take 40 mg by mouth daily.    POLYETHYLENE GLYCOL (MIRALAX) PACKET    Take 17 g by mouth 2 (two) times daily.    PROMETHAZINE (PHENERGAN) 25 MG TABLET    Take 1 tablet (25 mg total) by mouth every 6 (six) hours as needed.    SUCRALFATE (CARAFATE) 1 G TABLET    Take 1 tablet (1 g total) by mouth 4 (four) times daily.    TRAMADOL (ULTRAM) 50 MG TABLET    Take 2 tablets (100 mg total) by mouth every 6 (six) hours as needed.  Review of Systems   Gastrointestinal: Positive for vomiting, abdominal pain and diarrhea.   All other systems reviewed and are negative.        Physical Exam    BP: 204/120 mmHg, Heart Rate: 118 , Temp: 97.9 F (36.6 C), Resp Rate: 18 , SpO2: 97 %, Weight: 127.007 kg    Physical Exam   Nursing note and vitals reviewed.  Constitutional: He is oriented to person, place, and time. He appears well-developed and well-nourished. No distress.        Pt appears uncomfortable. Won't open his eyes to talk to me.    HENT:   Head: Normocephalic and atraumatic.   Nose: Nose normal.   Mouth/Throat: Oropharynx is clear and moist.   Eyes: Conjunctivae normal are normal. Right eye exhibits no discharge. Left eye exhibits no discharge.   Neck: Normal range of motion. Neck supple.   Pulmonary/Chest: No respiratory distress. He has no wheezes.   Abdominal: Soft. He exhibits no distension.        Abdomen is completely soft.    Musculoskeletal: Normal range of motion. He exhibits no edema.   Neurological: He is alert and oriented to person, place, and time.   Skin: Skin is warm and dry. He is not diaphoretic.   Psychiatric: He has a normal mood and affect. His behavior is normal.       MDM and ED Course     12:41 AM: Pt  states he has vomited a little and does not feel completely better. Asking for one more dose of pain medicine before he goes home.  Abdomen remains benign, no emesis noted by me.  Patinet iwht disease b ut appears well at this time, abdomen soft.    1:25 AM: Pt re-eval. Asking for more pain meds. Told pt that more narcotics are not a good idea and is not recommended by GI.  I explained I am not comfortable using more narcotics.  We discussed option of home vs. Admission for pain control but I explained likely admitting team would not give him more narcotics if admitted. States, "The doctor who admits me will give me narcotics."    1:48 AM: Pt is asleep in ED.     1:58 AM: Pt was discharged but now stating he wants to be admitted.     2:03 AM: D/w on-call for [facility]. Aware of pt condition. Will come see pt.    2:07 AM: Nurse tried waking pt up. He continues to sleep.  Did have one episode of small amount of emesis.      3:02 AM: D/w [facility] on-call. Will admit to Dr. Barney Drain. Maybe one episode of emesis otherwise been comfortable, well appearing, sleeping.       ED Medication Orders      Start     Status Ordering Provider    02/09/13 0044   HYDROmorphone (DILAUDID) injection 0.5 mg   Once      Route: Intravenous  Ordered Dose: 0.5 mg         Last MAR action:  Given Ileigh Mettler R    02/08/13 2344   LORazepam (ATIVAN) injection 1 mg   Once      Route: Intravenous  Ordered Dose: 1 mg         Last MAR action:  Given Melburn Treiber R    02/08/13 2344   sodium chloride 0.9 % bolus 1,000 mL   Once      Route:  Intravenous  Ordered Dose: 1,000 mL         Last MAR action:  Stopped Chandra Batch, Kresha Abelson R    02/08/13 2344   promethazine (PHENERGAN) injection 12.5 mg   Once      Route: Intravenous  Ordered Dose: 12.5 mg         Last MAR action:  Given Chandra Batch, Mycah Mcdougall R    02/08/13 2150   sodium chloride 0.9 % bolus 1,000 mL   Once      Route: Intravenous  Ordered Dose: 1,000 mL         Last MAR action:  Stopped  FORGASH, ANDREW J III    02/08/13 2150   HYDROmorphone (DILAUDID) injection 1 mg   Once      Route: Intravenous  Ordered Dose: 1 mg         Last MAR action:  Given FORGASH, ANDREW J III    02/08/13 2150   ondansetron (ZOFRAN) injection 4 mg   Once      Route: Intravenous  Ordered Dose: 4 mg         Last MAR action:  Given FORGASH, ANDREW J III                 MDM      Procedures    Clinical Impression & Disposition     Clinical Impression  Final diagnoses:   Abdominal pain   Gastroparesis        ED Disposition     Observation Admitting Physician: Ashok Cordia [[id number]]  Diagnosis: Abdominal pain [1190313]  Estimated Length of Stay: < 2 midnights  Tentative Discharge Plan?: Home or Self Care [1]  Patient Class: Observation [104]             New Prescriptions    No medications on file        Treatment Team: Scribe: Ardis Rowan    Attestations:  I was acting as a scribe for Leanora Ivanoff, MD on [name]  Treatment Team: Scribe: Ardis Rowan     I am the first provider for this patient and I personally performed the services documented. Treatment Team: Scribe: Ardis Rowan is scribing for me on Radler,Shahil L. This note accurately reflects work and decisions made by me.  Cheryel Kyte, Dorann Lodge, MD      Terrius Gentile, Dorann Lodge, MD  02/09/13 [phone]

## 2013-02-08 NOTE — ED Notes (Signed)
PT was just D/C'd today for DM, gastroparesis. Started to have diffuse abdominal pain, d/v few hrs ago.

## 2013-02-08 NOTE — Discharge Instructions (Signed)
Gastroparesis  Gastroparesis (also called delayed gastric emptying) occurs when the stomach takes longer than normal to empty of food. This is due to a problem with motility (the movement of the muscles in the digestive tract). For many people, gastroparesis is a lifelong condition. But treatment can help relieve symptoms and prevent complications. Read on to learn more about gastroparesis and how it can be managed.  How Gastroparesis Develops     Gastroparesis means that food and fluids move too slowly out of the stomach into the duodenum.   With normal motility, signals from nerves tell the stomach muscles when to contract. These muscles move food from the stomach into the duodenum (the first part of the small bowel). With gastroparesis, the nerves or muscles are damaged. This causes motility to slow down or stop completely. As a result, food cannot move from the stomach properly. This delayed emptying can cause nausea, vomiting, and other symptoms. Malnutrition can result. Bezoars (hardened lumps of food) can form in the stomach and cause other complications as well.  Causes of Gastroparesis  Gastroparesis can be caused by any of the following:   Diabetes   Surgery involving any of the digestive organs, such as the stomach and bowels   Certain medications, such as strong pain medications (narcotics)   Certain conditions, such as systemic scleroderma, Parkinson's disease, and thyroid disease  In many cases, the cause of gastroparesis cannot be found.  Signs and Symptoms of Gastroparesis  These can include:   Nausea and vomiting   Feeling full quickly when eating   Abdominal pain   Heartburn   Abdominal bloating   Weight loss   Loss of appetite   High and low blood sugar levels (in people withdiabetes)  Diagnosing Gastroparesis  Your doctor will ask about your symptoms and health history. You'll also be examined. In addition, blood tests and X-rays are often done to check your health and rule out  other problems. To confirm the problem, you may need other tests as well. These can include:   Upper endoscopy.This is doneto see inside the stomach and duodenum. For the test, an endoscope is used. This is a thin, flexible tube with a tiny camera on the end. It's inserted through the mouth and down into the stomach and duodenum.   Upper gastrointestinal (GI) series.This is doneto take X-rays of the upper GI tract from the mouth to the small bowel. For the test, a substance called barium is used. The barium coats the upper GI tract so that it will show up clearly on X-rays.   Gastric emptying scan.This is done to measure how quickly food leaves the stomach. For the test, a meal containing a harmless radioactive substance (tracer) is eaten. Then scans of the stomach are done. The tracer shows up clearly on the scans and shows the movement of the food through the stomach.  Treating Gastroparesis  The goal of treatment is to help you manage your condition. Treatment may include one or more of the following:   Dietary changes.You may need to make changes to your eating habits and daily diet. For instance, your doctor may instruct you to eat small meals throughout the day. Doing this can keep you from feeling full too quickly. You may be placed on a liquid or "soft" diet. This means you'll eat liquid foods or foods that are mashed or put through a blender. In addition, you may need to avoid foods high in fats and fiber. These can slow digestion.   For more help with your diet, your doctor can refer you to a dietitian. In severe cases, you may need a feeding tube. This sends liquid food or medication directly to your small bowel, bypassing the stomach.   Medications.These can help manage symptoms, such as nausea and vomiting. They can also improve motility. Each medication has specific risks and side effects. Your doctor can tell you more about any medication that is prescribed for you.   Surgery.You may need  to have a tube surgically inserted into the stomach. The tube removes excess air and fluid. This can relieve severe symptoms of nausea and vomiting. In rare cases, other surgery may be needed on the stomach or small bowel. This is to create a new passageway for food to be emptied from the stomach.   Other treatments.These include botulin toxin injection and gastric electrical stimulation. They are done less often and may not be available. Your doctor can tell you more about these treatments if they are options for you.  Diabetes and Gastroparesis  If you have diabetes, gastroparesis can make it harder to manage your blood sugar level. You'll need to take extra steps in your treatment to prevent complications. Work with your doctor to learn what you can do to protect your health. For more information, contact the American Diabetes Association,www.diabetes.org.   Long-term Concerns  With treatment, most people can manage their symptoms and maintain their usual routines. If your symptoms are moderate to severe, you may need to see your doctor more frequently for checkups. Also, other treatments will likely be needed.   2000-2014 [employer], [address], [place] [address]. All rights reserved. This information is not intended as a substitute for professional medical care. Always follow your healthcare professional's instructions.

## 2013-02-08 NOTE — Progress Notes (Signed)
Pt discharged to home by MD, VSS, 02 sats on RA>92%, has denied any sob,N/V, dizziness, pain or other discomforts.Tolerating diet and voiding without difficultly. Ambulating in room with  steady gait.  Discharge instructions with follow up appointments, discharge home medications discussed pt  in detail and pt stated understanding.  Medication teaching completed. IV  removed.  Pt left via wheelchair. `

## 2013-02-08 NOTE — Plan of Care (Signed)
Pt a/o x 4, pain medication administered for abdominal pain as ordered. Pt states he feels his symptoms and pain are improving, less episodes of emesis than previous evening. Pt also states he is starting to have an appetite, will discuss with MD in morning if we can advance diet. No sob, resp distress or chest pain throughout shift. Independent oob. No difficulty swallowing observed. Will continue to monitor.

## 2013-02-08 NOTE — Progress Notes (Signed)
Attending Progress Note    A/P: I agree with the exam, assessment and plan as per Dr. Blenda Nicely this AM.   Better today.  Diabetes stable.  If tolerates lunch, may go home with out patient follow up.       S:Pt seen and examined concurrently today with Dr. Blenda Nicely.  Chart reviewed.  Case discussed in detail.  No chest pain, SOB, abd pain improved.  Minimal nausea.  Not vomiting after CLD.      Patient Active Problem List   Diagnosis   . GERD (gastroesophageal reflux disease)   . Gastroparesis   . Dehydration, moderate   . Gastritis without bleeding   . Type 2 diabetes, uncontrolled, with neuropathy   . Hypertension   . Nausea & vomiting   . Intractable vomiting   . HTN (hypertension)   . Diabetic gastroparesis   . Intractable nausea and vomiting   . Vomiting         Current Facility-Administered Medications   Medication Dose Route Frequency Last Rate Last Dose   . 0.45% NaCl infusion   Intravenous Continuous 50 mL/hr at 02/07/13 1425     . chlorproMAZINE (THORAZINE) tablet 25 mg  25 mg Oral QD PRN   25 mg at 02/05/13 1424   . dextrose (GLUCOSE) 40 % oral gel 15 g  15 g Oral PRN       . dextrose 50 % bolus 25 mL  25 mL Intravenous PRN       . diphenhydrAMINE (BENADRYL) injection 25 mg  25 mg Intravenous QHS   25 mg at 02/07/13 2152   . erythromycin (ERYTHROCIN) 100 mg in sodium chloride 0.9 % 250 mL IVPB  100 mg Intravenous Q8H 250 mL/hr at 02/08/13 0741 100 mg at 02/08/13 0741   . glucagon (rDNA) (GLUCAGEN) injection 1 mg  1 mg Intramuscular PRN       . heparin (porcine) injection 5,000 Units  5,000 Units Subcutaneous Q8H SCH   5,000 Units at 02/08/13 0553   . hydrALAZINE (APRESOLINE) injection 10 mg  10 mg Intravenous Q6H PRN       . hydrochlorothiazide (HYDRODIURIL) tablet 12.5 mg  12.5 mg Oral Daily   12.5 mg at 02/08/13 0940   . HYDROmorphone (DILAUDID) injection 0.4 mg  0.4 mg Intravenous Q2H PRN   0.4 mg at 02/08/13 0317   . insulin aspart (NovoLOG) injection 1-8 Units  1-8 Units Subcutaneous PRN   1 Units at  02/06/13 1329   . insulin glargine (LANTUS) injection 22 Units  22 Units Subcutaneous QHS   22 Units at 02/07/13 2152   . [EXPIRED] ketorolac (TORADOL) injection 30 mg  30 mg Intravenous Once       . lisinopril (PRINIVIL,ZESTRIL) tablet 40 mg  40 mg Oral Daily   40 mg at 02/08/13 0939   . LORazepam (ATIVAN) injection 1 mg  1 mg Intravenous Q6H SCH   1 mg at 02/08/13 0552   . metoclopramide (REGLAN) injection 10 mg  10 mg Intravenous Q8H SCH   10 mg at 02/08/13 0939   . metoprolol (LOPRESSOR) tablet 25 mg  25 mg Oral Q12H SCH   25 mg at 02/08/13 0940   . naloxone [facility]) injection 0.2 mg  0.2 mg Intravenous PRN       . ondansetron (ZOFRAN) injection 4 mg  4 mg Intravenous Q8H PRN   4 mg at 02/07/13 2152   . pantoprazole (PROTONIX) injection 40 mg  40 mg Intravenous Daily  40 mg at 02/08/13 0939   . polyethylene glycol (MIRALAX) packet 17 g  17 g Oral BID       . [COMPLETED] potassium chloride (K-DUR,KLOR-CON) CR tablet 40 mEq  40 mEq Oral Once   40 mEq at 02/08/13 0940   . promethazine (PHENERGAN) injection 12.5 mg  12.5 mg Intravenous Q6H SCH   12.5 mg at 02/08/13 0553   . [DISCONTINUED] potassium chloride 10 mEq in 100 mL IVPB (premix)  10 mEq Intravenous Q1H           O: Patient is alert and non-toxic.   Mental status: Alert         Filed Vitals:    02/08/13 0716   BP: 143/89   Pulse: 74   Temp: 97.3 F (36.3 C)   Resp: 16   SpO2: 94%       No intake or output data in the 24 hours ending 02/08/13 1117       Results     Procedure Component Value Units Date/Time    Magnesium [[id number]] Collected:02/08/13 0634    Specimen Information:Blood Updated:02/08/13 0911     Magnesium 1.9 mg/dL     Glucose Whole Blood - POCT [[id number]] Collected:02/08/13 0718     POCT - Glucose Whole blood 95 mg/dL NWGNFAO:13/08/65 7846    Basic Metabolic Panel [[id number]]  (Abnormal) Collected:02/08/13 0645    Specimen Information:Blood Updated:02/08/13 0710     Glucose 102 (H) mg/dL      BUN 32.4 mg/dL      Creatinine 1.1 mg/dL       CALCIUM 8.9 mg/dL      Sodium 401 mEq/L      Potassium 3.2 (L) mEq/L      Chloride 102 mEq/L      CO2 28 mEq/L     GFR [[id number]] Collected:02/08/13 0645     EGFR >60.0 Updated:02/08/13 0710    Glucose Whole Blood - POCT [[id number]]  (Abnormal) Collected:02/08/13 0314     POCT - Glucose Whole blood 126 (H) mg/dL DGLOVFI:43/32/95 1884    Glucose Whole Blood - POCT [[id number]]  (Abnormal) Collected:02/07/13 2106     POCT - Glucose Whole blood 141 (H) mg/dL WFUXNAT:55/73/22 0254    Glucose Whole Blood - POCT [[id number]]  (Abnormal) Collected:02/07/13 1644     POCT - Glucose Whole blood 139 (H) mg/dL GBTDVVO:16/07/37 1062    Glucose Whole Blood - POCT [[id number]]  (Abnormal) Collected:02/07/13 1130     POCT - Glucose Whole blood 150 (H) mg/dL OJJKKXF:81/82/99 3716          Radiology Results (24 Hour)     ** No Results found for the last 24 hours. **                Ashok Cordia M.D. [address] [facility]  [phone]

## 2013-02-09 ENCOUNTER — Other Ambulatory Visit: Payer: Self-pay

## 2013-02-09 DIAGNOSIS — R109 Unspecified abdominal pain: Secondary | ICD-10-CM | POA: Insufficient documentation

## 2013-02-09 LAB — ECG 12-LEAD
Atrial Rate: 111 {beats}/min
P Axis: 78 degrees
P-R Interval: 190 ms
Q-T Interval: 328 ms
QRS Duration: 88 ms
QTC Calculation (Bezet): 446 ms
R Axis: 58 degrees
T Axis: 42 degrees
Ventricular Rate: 111 {beats}/min

## 2013-02-09 LAB — GLUCOSE WHOLE BLOOD - POCT
Whole Blood Glucose POCT: 120 mg/dL — ABNORMAL HIGH (ref 70–100)
Whole Blood Glucose POCT: 90 mg/dL (ref 70–100)
Whole Blood Glucose POCT: 99 mg/dL (ref 70–100)
Whole Blood Glucose POCT: 107 mg/dL — ABNORMAL HIGH (ref 70–100)

## 2013-02-09 LAB — URINALYSIS WITH MICROSCOPIC
Bilirubin, UA: NEGATIVE
Blood, UA: NEGATIVE
Glucose, UA: 150 — AB
Ketones UA: 40 — AB
Leukocyte Esterase, UA: NEGATIVE
Nitrite, UA: NEGATIVE
Specific Gravity UA: 1.015 (ref 1.001–1.035)
Urine pH: 6 (ref 5.0–8.0)
Urobilinogen, UA: NORMAL mg/dL

## 2013-02-09 MED ORDER — DEXTROSE 50 % IV SOLN
25.0000 mL | INTRAVENOUS | Status: DC | PRN
Start: 2013-02-09 — End: 2013-02-10

## 2013-02-09 MED ORDER — INSULIN ASPART 100 UNIT/ML SC SOLN
1.0000 [IU] | Freq: Every evening | SUBCUTANEOUS | Status: DC | PRN
Start: 2013-02-09 — End: 2013-02-10
  Administered 2013-02-10: 2 [IU] via SUBCUTANEOUS

## 2013-02-09 MED ORDER — SODIUM CHLORIDE 0.9 % IV SOLN
100.0000 mg | Freq: Three times a day (TID) | INTRAVENOUS | Status: DC
Start: 2013-02-09 — End: 2013-02-10
  Administered 2013-02-09 – 2013-02-10 (×5): 100 mg via INTRAVENOUS
  Filled 2013-02-09 (×6): qty 100

## 2013-02-09 MED ORDER — CHLORPROMAZINE HCL 25 MG PO TABS
25.0000 mg | ORAL_TABLET | Freq: Three times a day (TID) | ORAL | Status: DC | PRN
Start: 2013-02-09 — End: 2013-02-10
  Filled 2013-02-09 (×3): qty 1

## 2013-02-09 MED ORDER — GLUCOSE 40 % PO GEL
15.0000 g | ORAL | Status: DC | PRN
Start: 2013-02-09 — End: 2013-02-10

## 2013-02-09 MED ORDER — NALOXONE HCL 0.4 MG/ML IJ SOLN
0.2000 mg | INTRAMUSCULAR | Status: DC | PRN
Start: 2013-02-09 — End: 2013-02-10

## 2013-02-09 MED ORDER — ONDANSETRON HCL 4 MG/2ML IJ SOLN
4.0000 mg | Freq: Three times a day (TID) | INTRAMUSCULAR | Status: DC | PRN
Start: 2013-02-09 — End: 2013-02-10
  Administered 2013-02-09 – 2013-02-10 (×2): 4 mg via INTRAVENOUS
  Filled 2013-02-09 (×2): qty 2

## 2013-02-09 MED ORDER — NORTRIPTYLINE HCL 10 MG PO CAPS
10.0000 mg | ORAL_CAPSULE | Freq: Every evening | ORAL | Status: DC
Start: 2013-02-09 — End: 2013-02-10
  Administered 2013-02-09: 10 mg via ORAL
  Filled 2013-02-09 (×3): qty 1

## 2013-02-09 MED ORDER — PANTOPRAZOLE SODIUM 40 MG IV SOLR
40.0000 mg | Freq: Every day | INTRAVENOUS | Status: DC
Start: 2013-02-09 — End: 2013-02-10
  Administered 2013-02-09 – 2013-02-10 (×2): 40 mg via INTRAVENOUS
  Filled 2013-02-09 (×2): qty 40

## 2013-02-09 MED ORDER — DIPHENHYDRAMINE HCL 50 MG/ML IJ SOLN
25.0000 mg | Freq: Every evening | INTRAMUSCULAR | Status: DC
Start: 2013-02-09 — End: 2013-02-10
  Administered 2013-02-09: 25 mg via INTRAVENOUS
  Filled 2013-02-09: qty 1

## 2013-02-09 MED ORDER — INSULIN ASPART 100 UNIT/ML SC SOLN
1.0000 [IU] | Freq: Three times a day (TID) | SUBCUTANEOUS | Status: DC | PRN
Start: 2013-02-09 — End: 2013-02-10
  Filled 2013-02-09: qty 10

## 2013-02-09 MED ORDER — HYDROMORPHONE HCL PF 1 MG/ML IJ SOLN
0.5000 mg | Freq: Once | INTRAMUSCULAR | Status: AC
Start: 2013-02-09 — End: 2013-02-09
  Administered 2013-02-09: 0.5 mg via INTRAVENOUS
  Filled 2013-02-09: qty 1

## 2013-02-09 MED ORDER — GRANISETRON 3.1 MG/24HR TD PTCH
MEDICATED_PATCH | TRANSDERMAL | 0 refills | Status: DC
Start: 2013-02-09 — End: 2013-02-09
  Filled 2013-02-09: qty 4, 28d supply, fill #0

## 2013-02-09 MED ORDER — GLUCAGON HCL (RDNA) 1 MG IJ SOLR
1.0000 mg | INTRAMUSCULAR | Status: DC | PRN
Start: 2013-02-09 — End: 2013-02-10

## 2013-02-09 MED ORDER — LORAZEPAM 2 MG/ML IJ SOLN
1.0000 mg | Freq: Four times a day (QID) | INTRAMUSCULAR | Status: DC
Start: 2013-02-09 — End: 2013-02-10
  Administered 2013-02-09 – 2013-02-10 (×4): 1 mg via INTRAVENOUS
  Filled 2013-02-09 (×4): qty 1

## 2013-02-09 MED ORDER — HYDROMORPHONE HCL PF 1 MG/ML IJ SOLN
0.5000 mg | INTRAMUSCULAR | Status: DC | PRN
Start: 2013-02-09 — End: 2013-02-10
  Administered 2013-02-09 (×2): 0.5 mg via INTRAVENOUS
  Filled 2013-02-09 (×2): qty 1

## 2013-02-09 MED ORDER — LISINOPRIL 10 MG PO TABS
40.0000 mg | ORAL_TABLET | Freq: Every day | ORAL | Status: DC
Start: 2013-02-09 — End: 2013-02-10
  Administered 2013-02-09 – 2013-02-10 (×2): 40 mg via ORAL
  Filled 2013-02-09 (×2): qty 4

## 2013-02-09 MED ORDER — INSULIN ASPART 100 UNIT/ML SC SOLN
1.0000 [IU] | SUBCUTANEOUS | Status: DC | PRN
Start: 2013-02-09 — End: 2013-02-10
  Administered 2013-02-10: 1 [IU] via SUBCUTANEOUS

## 2013-02-09 MED ORDER — METOCLOPRAMIDE HCL 5 MG/ML IJ SOLN
10.0000 mg | Freq: Three times a day (TID) | INTRAMUSCULAR | Status: DC
Start: 2013-02-09 — End: 2013-02-10
  Administered 2013-02-09 – 2013-02-10 (×5): 10 mg via INTRAVENOUS
  Filled 2013-02-09 (×5): qty 2

## 2013-02-09 MED ORDER — SODIUM CHLORIDE 0.9 % IV SOLN
INTRAVENOUS | Status: DC
Start: 2013-02-09 — End: 2013-02-10

## 2013-02-09 MED ORDER — ENOXAPARIN SODIUM 40 MG/0.4ML SC SOLN
40.0000 mg | Freq: Every day | SUBCUTANEOUS | Status: DC
Start: 2013-02-09 — End: 2013-02-10
  Administered 2013-02-09 – 2013-02-10 (×2): 40 mg via SUBCUTANEOUS
  Filled 2013-02-09 (×2): qty 0.4

## 2013-02-09 MED ORDER — AMLODIPINE BESYLATE 5 MG PO TABS
10.0000 mg | ORAL_TABLET | Freq: Every day | ORAL | Status: DC
Start: 2013-02-09 — End: 2013-02-10
  Administered 2013-02-09 – 2013-02-10 (×2): 10 mg via ORAL
  Filled 2013-02-09 (×2): qty 2

## 2013-02-09 MED ORDER — METOPROLOL TARTRATE 25 MG PO TABS
25.0000 mg | ORAL_TABLET | Freq: Two times a day (BID) | ORAL | Status: DC
Start: 2013-02-09 — End: 2013-02-10
  Administered 2013-02-09 – 2013-02-10 (×3): 25 mg via ORAL
  Filled 2013-02-09 (×3): qty 1

## 2013-02-09 NOTE — Progress Notes (Signed)
Patient seen and discussed with Dr. Army Melia.  Physically present for her exam, pertinent portions directly duplicated and agree with her findings.  37 year old gentleman with 15 admissions since May of 2014 for uncontrolled diabetic gastroparesis.  Was discharged yesterday following a 6 day stay for gastroparesis but was readmitted within 12 hours with recurrent severe pain, nausea and vomiting.  He has been on Reglan at home and has been treated with antiemetics and fluids as well as erythromycin IV while hospitalized.    He has not tried tricyclic antidepressants or Sancuso.  Although tricyclics can potentially exacerbate reflux and cause a slight delay in gastric emptying, there have been trials that have shown utility in controlling nausea and vomiting in gastroparesis.  Patient has benadryl ordered but did not receive it last night due to nursing feeling he was sleepy.  Would recommend starting low-dose nortriptyline to see if this improves his symptoms as well as reinforcing the administration of benadryl even if he is sleepy.  Sancuso may be an option if it is covered by his insurance.    Although narcotics can certainly cause delayed emptying, his pain is quite severe and is leading to increases in his blood pressure, therefore I would favor very low-dose narcotics for a brief period to alleviate his pain.    Agree with checking EKG to make sure QT is not prolonged prior to starting nortriptyline.  Agree with assessment and plan per Dr. Army Melia.

## 2013-02-09 NOTE — Progress Notes (Signed)
FAMILY MEDICINE PROGRESS NOTE    Date Time: 02/09/2013 7:05 AM  Patient Name: [name]  Attending Physician: Ashok Cordia, MD  Team Contact Info: [employer] [phone] 8 am -5 pm: After hours [phone]      Assessment:     Active Hospital Problems    Diagnosis   . Abdominal pain   . Diabetic gastroparesis   . HTN (hypertension)   . Type 2 diabetes, uncontrolled, with neuropathy       37 y/o male h/o gastroparesis with multiple prior admissions, uncontrolled IDDM, HTN presents with intractable nausea/vomit and epigastric pain. Recently discharged for similar symptoms     Plan:   #Nausea/Vomit secondary to persistent gastroparesis flare:    -no leukocytosis, normal LFTs    -continue regimen from previous hospitalization including     Phenergan 12.5 mg IV q6h scheduled     Reglan 10 mg IV TID scheduled     Erythromycin 100 IV q8h scheduled     Carafate 1 gm PO QID when tolerating PO     Protonix 40 mg IV qd     Ativan 1 mg q6h with Benadryl 25 mg IV qhs     Thorazine 25 mg prn for hiccups       -s/p 1.5 mg IV Dilaudid in ER. Will try to minimize narcotics but to adequately address pain    -s/p 2 L bolus, continue maintenance IVFs    - Advance to clear liquid diet    - Obtain baseline EKG with history of QT prolongation prior to initiating Nortriptyline    - GI consultation     #AKI- likely pre-renal in setting of dehydration/vomiting    -IVFs as above    -recheck BMP in am     #IDDM, uncontrolled:    -SSI while NPO    -restart Lantus tomorrow - home regimen 36 units qhs     #HTN:    -continue home regimen when tolerates PO- lisinopril 40 daily, metoprolol 25 mg BID, amlodipine 10 mg daily     Diet: Clears   Ppx: SCDs, SQ Lovenox   Code: Full     Dispo: Admit for Obs, will possibly discharge tomorrow       Disposition:     Anticipated discharge needs: none       Subjective     CC: Vomiting     Interval History/24 hour events:   Readmitted overnight for nausea and vomiting at home uncontrolled on home regimen.  Still with multiple episodes of emesis overnight and epigastric abdominal pain. He felt like he overdid it. Denies recent alcohol use or drug use in the interim. Denies chest pain, shortness of breath. Has not had anything to eat yet.       Physical Exam:       VITAL SIGNS PHYSICAL EXAM   Temp:  [97.3 F (36.3 C)-99.9 F (37.7 C)] 98.6 F (37 C)  Heart Rate:  [72-118] 94   Resp Rate:  [16-18] 18   BP: (143-234)/(76-127) 153/76 mmHg  Telemetry: Sinus tachycardia      No intake or output data in the 24 hours ending 02/09/13 0705 Physical Exam  Gen: Awake alert intermittent distress with vomiting  HEENT: MMM  Cardiac: RRR no Murmurs, rubs, gallops   Lungs: CTAB no rales   Abdomen: + BS soft minimal tenderness to palpation, non distended   Ext: + 2 DP, no edema          Meds:  Medications were reviewed.    Labs:       Labs (last 72 hours):      Lab 02/08/13 2231 02/06/13 0632   WBC 10.17 6.62   HGB 14.9 12.3*   HCT 43.3 37.1*   PLT 330 280       No results found for this basename: PT:2,INR:2,PTT:2 in the last 168 hours   Lab 02/08/13 2231 02/08/13 0645 02/02/13 2301   NA 138 139 --   K 3.8 3.2* --   CL 101 102 --   CO2 24 28 --   BUN 14.0 10.0 --   CREAT 1.5* 1.1 --   CA 9.9 8.9 --   ALB 4.3 -- 4.2   PROT 7.8 -- 7.7   BILITOTAL 0.4 -- 0.5   ALKPHOS 83 -- 107   ALT 22 -- 25   AST 18 -- 19   GLU 166* 102* --                 Microbiology, reviewed and are significant for:  None      Imaging personally reviewed, including:   None      Case discussed with: Dr.Burroughs    Signed by: Kristine Linea, MD PGY-2  Pager # [phone]  [facility]   [address]   [place], [place] [address]

## 2013-02-09 NOTE — H&P (Signed)
ADMISSION HISTORY AND PHYSICAL EXAM    Date Time: 02/09/2013 2:45 AM  Patient Name: [name]  Attending Physician: Leanora Ivanoff, MD  Primary Care Physician: [name], MD  Team contact:  [phone]    CC: Nausea/vomit      Assessment:   Active Problems:   Abdominal pain    37 y/o male h/o gastroparesis with multiple prior admissions, uncontrolled IDDM, HTN presents with intractable nausea/vomit and epigastric pain since discharged from hospital today.    Plan:     #Nausea/Vomit secondary to persistent gastroparesis flare:  -no leukocytosis, normal LFTs  -continue regimen from previous hospitalization including  Phenergan 12.5 mg IV q6h scheduled   Reglan 10 mg IV TID scheduled   Erythromycin 100 IV q8h scheduled   Carafate 1 gm PO QID when tolerating PO   Protonix 40 mg IV qd  Ativan 1 mg q6h with Benadryl 25 mg IV qhs  Thorazine 25 mg prn for hiccups  -s/p 1.5 mg IV Dilaudid in ER. Will try to avoid opiates since they decrease GI motility, discussed with patient   -s/p 2 L bolus, continue maintenance IVFs   - NPO, plan to advance diet to low residue in am    #AKI- likely pre-renal in setting of dehydration/vomiting  -IVFs as above  -recheck BMP in am    #IDDM, uncontrolled:  -SSI while NPO  -restart Lantus tomorrow- home regimen 36 units qhs     #HTN:  -continue home regimen when tolerates PO- lisinopril 40 daily, metoprolol 25 mg BID, amlodipine 10 mg daily    Diet: NPO tonight    Ppx: SCDs, SQ Lovenox    Code: Full    Dispo: Admit for Obs, anticipate discharge home in am    Discussed with Dr. Allegra Lai    History of Presenting Illness:   [name] is a 37 y.o. male h/o gastroparesis with multiple prior admissions, uncontrolled IDDM, HTN presents with intractable nausea/vomit and epigastric pain since discharged from hospital today. After discharge, he went home and ate a bowl of soup, pretzels and drank some gingerale. He states he thinks he might have "overdone it". Epigastric pain,  nausea, non-bloody, non-bilious vomiting returned. No fever/chills, diarrhea, shortness of breath, chest discomfort.    In ER, 2 L bolus IVF, 1.5 mg Dilaudid, 1 mg Ativan, 4 mg Zofran, 12.5 mg Phenergan were administered. Patient continues to dry heave and complain of epigastric discomfort.    Past Medical History:     Past Medical History   Diagnosis Date   . Diabetes    . Hypertensive disorder    . Gastroparesis            Past Surgical History:     Past Surgical History   Procedure Date   . Cholecystectomy    . Knee arthroscopy w/ acl reconstruction      left   . Egd 08/08/2012     Procedure: EGD;  Surgeon: [name], MD;  Location: [facility];  Service: Gastroenterology;  Laterality: N/A;  w\bx       Family History:   unknown    Social History:     History   Smoking status   . Never Smoker    Smokeless tobacco   . Never Used     History   Alcohol Use   . Yes     Comment: "occasional"     History   Drug Use No  Allergies:   No Known Allergies    Medications:     Current/Home Medications    AMLODIPINE (NORVASC) 5 MG TABLET    Take 2 tablets (10 mg total) by mouth daily.    ERYTHROMYCIN (ERYTHROCIN) 250 MG TABLET    Take by mouth 3 (three) times daily. unknown dose    GABAPENTIN (NEURONTIN) 300 MG CAPSULE    Take 300 mg by mouth nightly.    INSULIN GLARGINE (LANTUS) 100 UNIT/ML INJECTION    Inject 22 Units into the skin nightly. Check am fasting sugars. Increase by 1 unit every morning for readings >120    LISINOPRIL (PRINIVIL,ZESTRIL) 40 MG TABLET    Take 1 tablet (40 mg total) by mouth daily.    LORAZEPAM (ATIVAN) 0.5 MG TABLET    Take 1 tablet (0.5 mg total) by mouth every 6 (six) hours as needed. PRN cyclic vomiting    METOPROLOL (LOPRESSOR) 25 MG TABLET    Take 1 tablet (25 mg total) by mouth every 12 (twelve) hours.    PANTOPRAZOLE (PROTONIX) 40 MG TABLET    Take 40 mg by mouth daily.    POLYETHYLENE GLYCOL (MIRALAX) PACKET    Take 17 g by mouth 2 (two) times daily.    PROMETHAZINE  (PHENERGAN) 25 MG TABLET    Take 1 tablet (25 mg total) by mouth every 6 (six) hours as needed.    SUCRALFATE (CARAFATE) 1 G TABLET    Take 1 tablet (1 g total) by mouth 4 (four) times daily.    TRAMADOL (ULTRAM) 50 MG TABLET    Take 2 tablets (100 mg total) by mouth every 6 (six) hours as needed.             Review of Systems:   Review of Systems - History obtained from the patient  General ROS: negative for - chills, fever, sleep disturbance, weight gain or weight loss  Psychological ROS: negative for - anxiety or depression  Ophthalmic ROS: negative for - blurry vision, double vision or loss of vision  ENT ROS: negative for - headaches, nasal congestion or nasal discharge  Respiratory ROS: no cough, shortness of breath, or wheezing  Cardiovascular ROS: no chest pain or dyspnea on exertion  Gastrointestinal ROS: + abdominal pain, nausea/vomit  Genito-Urinary ROS: no dysuria, trouble voiding, or hematuria  Musculoskeletal ROS: negative for - joint pain, joint swelling or muscle pain  Neurological ROS: no TIA or stroke symptoms  Dermatological ROS: negative for rash      Physical Exam:   Patient Vitals for the past 24 hrs:   BP Temp Pulse Resp SpO2 Height Weight   02/09/13 0152 163/98 mmHg - 95  16  98 % - -   02/09/13 0048 195/120 mmHg 99.9 F (37.7 C) 103  18  98 % - -   02/08/13 2151 225/110 mmHg - - - - - -   02/08/13 2150 204/120 mmHg - - - - - -   02/08/13 2145 - 97.9 F (36.6 C) 118  18  97 % 1.93 m (6\' 4" ) 127.007 kg (280 lb)     Body mass index is 34.10 kg/(m^2).  No intake or output data in the 24 hours ending 02/09/13 0245    General: awake, alert, oriented x 3; appears uncomfortable.  HEENT: perrla, eomi, sclera anicteric  oropharynx clear without lesions, mucous membranes moist  Neck: supple, no lymphadenopathy, no thyromegaly, no JVD, no carotid bruits  Cardiovascular: regular rate and rhythm, no murmurs, rubs or  gallops  Lungs: clear to auscultation bilaterally, without wheezing, rhonchi, or  rales  Abdomen: soft, mildly TTP epigastric region; no palpable masses, no hepatosplenomegaly, normoactive bowel sounds, no rebound or guarding  Extremities: no clubbing, cyanosis, or edema  Neuro: cranial nerves grossly intact, strength 5/5 in upper and lower extremities, sensation intact,   Skin: no rashes or lesions noted         Labs:     Results     Procedure Component Value Units Date/Time    Basic Metabolic Panel (BMP) [[id number]]  (Abnormal) Collected:02/08/13 2231    Specimen Information:Blood Updated:02/08/13 2259     Glucose 166 (H) mg/dL      BUN 40.9 mg/dL      Creatinine 1.5 (H) mg/dL      CALCIUM 9.9 mg/dL      Sodium 811 mEq/L      Potassium 3.8 mEq/L      Chloride 101 mEq/L      CO2 24 mEq/L     Lipase [[id number]]  (Abnormal) Collected:02/08/13 2231    Specimen Information:Blood Updated:02/08/13 2259     Lipase 88 (H) U/L     Hepatic function panel (LFTs) [[id number]] Collected:02/08/13 2231    Specimen Information:Blood Updated:02/08/13 2259     Bilirubin, Total 0.4 mg/dL      Bilirubin, Direct 0.2 mg/dL      Bilirubin, Indirect 0.2 mg/dL      AST (SGOT) 18 U/L      ALT 22 U/L      Alkaline Phosphatase 83 U/L      Protein, Total 7.8 g/dL      Albumin 4.3 g/dL      Globulin 3.5 g/dL      Albumin/Globulin Ratio 1.2     GFR [[id number]] Collected:02/08/13 2231     EGFR >60.0 Updated:02/08/13 2259    CBC with differential [[id number]]  (Abnormal) Collected:02/08/13 2231    Specimen Information:Blood / Blood Updated:02/08/13 2246     WBC 10.17      RBC 5.40      Hgb 14.9 g/dL      Hematocrit 27.2 %      MCV 80.2 fL      MCH 27.6 (L) pg      MCHC 34.4 g/dL      RDW 13 %      Platelets 330      MPV 9.4 fL      Neutrophils 76 %      Lymphocytes Automated 15 %      Monocytes 8 %      Eosinophils Automated 0 %      Basophils Automated 0 %      Immature Granulocyte 1 %      Nucleated RBC 0      Neutrophils Absolute 7.78      Abs Lymph Automated 1.50      Abs Mono Automated 0.76      Abs Eos Automated 0.04       Absolute Baso Automated 0.02      Absolute Immature Granulocyte 0.07 (H)                    Signed by: Aquilla Hacker, MD   [facility] [address]   [place], [place] [address]  [phone]   VF:IEPPIR, Vania Rea, MD

## 2013-02-09 NOTE — ED Notes (Addendum)
Informed patient that per Dr. Sherrie Mustache if the patient wants to be admitted that is fine but he will not be getting any narcotics for his pain. His pain will be addressed in other ways. Patient states that the last time he was admitted he received narcotics and does not understand why he can not get them if they help him with his pain. While talking to patient he keeps falling asleep and has to be shook awake in order to communicate with him. Informed patient that he can discuss his concerns with Dr. Sherrie Mustache and the admitting MD. Dr. Sherrie Mustache aware.

## 2013-02-09 NOTE — ED Notes (Signed)
Obtained report from [place], [place]

## 2013-02-09 NOTE — Progress Notes (Signed)
bp was 185/102, c/o abd pain 6/10. bp 157/92 after dilaudid .5mg  was given. Pt was sleepy all day and ambulating in hall way at this time.

## 2013-02-09 NOTE — Plan of Care (Signed)
Problem: Pain  Goal: Patient's pain/discomfort is manageable  Outcome: Progressing  Alert and oriented x 4.Denies sob or chest pain.Pt report of tolerable upper abdominal pain at 2 out of 10 per pt.Denies nausea or vomiting.

## 2013-02-09 NOTE — ED Notes (Signed)
Pt keeps eyes closed and needs much stimulation to answers questions; still sts pain is #6

## 2013-02-09 NOTE — Progress Notes (Signed)
[facility]  PROGRESS NOTE    Date Time: 02/09/2013 5:27 PM  Patient Name: [name], [name]      Chief Complaint:   Gastroparesis, n/v   Assessment and Plan:    Assessment:   1. Acute exacerbation of diabetic gastroparesis-- recurrent problem   2. Abdominal pain with N/V, 2/2 #1.   3. GERD   4. Chronic constipation   5. DMM2, insulin-dependant, poorly-controlled.   Plan:   1. Continue scheduled Erythromycin and Reglan.   2. Tight glucose control.   3. Continue PPI. Restart Carafate.   4. Continue full liquid diet. Advance as tolerated to frequent small low fat low residue meals.   5. Avoid narcotics which will slow GI motility further. Consider a trial of Ativan and Benadryl.   6. Agree with starting low-dose tricyclic.  7. Miralax 17 grams BID. Consider Relistor as an outpatient.   8. Outpatient GI f/u with Dr. Jens Som.     Subjective:   Patient returned shortly after discharge with intractable nausea, vomiting, abdominal pain. Had multiple episodes of emesis overnight.      Medications:     Current Facility-Administered Medications   Medication Dose Route Frequency   . amLODIPine  10 mg Oral Daily   . diphenhydrAMINE  25 mg Intravenous QHS   . enoxaparin  40 mg Subcutaneous Daily   . erythromycin  100 mg Intravenous Q8H SCH   . [COMPLETED] HYDROmorphone  0.5 mg Intravenous Once   . [COMPLETED] HYDROmorphone  1 mg Intravenous Once   . lisinopril  40 mg Oral Daily   . [COMPLETED] LORazepam  1 mg Intravenous Once   . LORazepam  1 mg Intravenous Q6H SCH   . metoclopramide  10 mg Intravenous Q8H SCH   . metoprolol  25 mg Oral Q12H   . nortriptyline  10 mg Oral QHS   . [COMPLETED] ondansetron  4 mg Intravenous Once   . pantoprazole  40 mg Intravenous Daily   . [COMPLETED] promethazine  12.5 mg Intravenous Once   . [COMPLETED] sodium chloride  1,000 mL Intravenous Once   . [COMPLETED] sodium chloride  1,000 mL Intravenous Once       Review of Systems:   Complete 12 Point ROS negative  or normal except those mentioned in HPI above.    Physical Exam:     Filed Vitals:    02/09/13 1643   BP: 157/101   Pulse: 98   Temp: 97.9 F (36.6 C)   Resp: 16   SpO2: 96%     General appearance: Well developed, well nourished, appears stated age and in NAD   Eyes: Sclera anicteric, pink conjunctivae, no ptosis   ENMT: mucous membranes moist, nose and ears appear normal. Oropharynx clear.   Chest: Non labored respirations, no audible wheezing, no clubbing or cyanosis   CV: Regular rate and rhythm, no JVD, no LE edema   Abdomen: soft, non-tender, non-distended, no masses or organomegaly   Skin: Normal color and turgor, no rashes, no suspicious skin lesions noted   Neuro: CN II-XII grossly intact. No gross movement disorders noted.   Mental status: Appropriate affect, alert and oriented x 3        Labs:     Recent Labs   [facility] 02/08/13 2231    WBC 10.17    HGB 14.9    HCT 43.3    PLT 330    MCV 80.2       Recent Labs   [place]  02/08/13 2231 02/08/13 0645 02/08/13 0634    NA 138 139 --    K 3.8 3.2* --    CL 101 102 --    CO2 24 28 --    BUN 14.0 10.0 --    CREAT 1.5* 1.1 --    GLU 166* 102* --    CA 9.9 8.9 --    MG -- -- 1.9    PHOS -- -- --       Recent Labs   Basename 02/08/13 2231    AST 18    ALT 22    ALKPHOS 83    BILITOTAL 0.4    BILIDIRECT 0.2    PROT 7.8    ALB 4.3       No results found for this basename: PTT:2,PT:2,INR:2 in the last 72 hours     Radiology:   Radiological Procedure reviewed:  AXR 02/02/13: Moderate retained stool in the colon.   Endoscopy:    EGD 08/08/12 Jens Som): gastritis      Pathology:

## 2013-02-09 NOTE — Progress Notes (Signed)
Received  patient from ED complaining of abdominal,nausea/vomiting,AOX3. Patient vomited upon arrival to floor,plan of care discussed. Patient asking for Dilaudid,Bp on high side upon arrival,will recheck BP,once patient settled.

## 2013-02-10 LAB — ECG 12-LEAD
Atrial Rate: 74 {beats}/min
P Axis: 56 degrees
P-R Interval: 174 ms
Q-T Interval: 404 ms
QRS Duration: 84 ms
QTC Calculation (Bezet): 448 ms
R Axis: 45 degrees
T Axis: 31 degrees
Ventricular Rate: 74 {beats}/min

## 2013-02-10 LAB — GFR: EGFR: >60

## 2013-02-10 LAB — BASIC METABOLIC PANEL
BUN: 8 mg/dL — ABNORMAL LOW (ref 9.0–21.0)
CO2: 25 mEq/L (ref 22–29)
Calcium: 8.6 mg/dL (ref 8.5–10.5)
Chloride: 106 mEq/L (ref 98–107)
Creatinine: 1.1 mg/dL (ref 0.7–1.3)
Glucose: 119 mg/dL — ABNORMAL HIGH (ref 70–100)
Potassium: 3.3 mEq/L — ABNORMAL LOW (ref 3.5–5.1)
Sodium: 140 mEq/L (ref 136–145)

## 2013-02-10 LAB — MAGNESIUM: Magnesium: 1.8 mg/dL (ref 1.6–2.6)

## 2013-02-10 LAB — GLUCOSE WHOLE BLOOD - POCT
Whole Blood Glucose POCT: 136 mg/dL — ABNORMAL HIGH (ref 70–100)
Whole Blood Glucose POCT: 182 mg/dL — ABNORMAL HIGH (ref 70–100)
Whole Blood Glucose POCT: 206 mg/dL — ABNORMAL HIGH (ref 70–100)

## 2013-02-10 MED ORDER — METOCLOPRAMIDE HCL 5 MG PO TABS
10.0000 mg | ORAL_TABLET | Freq: Three times a day (TID) | ORAL | Status: DC
Start: 2013-02-10 — End: 2013-02-28

## 2013-02-10 MED ORDER — INSULIN GLARGINE 100 UNIT/ML SC SOLN
10.0000 [IU] | Freq: Every evening | SUBCUTANEOUS | Status: DC
Start: 2013-02-10 — End: 2013-05-02

## 2013-02-10 MED ORDER — PANTOPRAZOLE SODIUM 40 MG PO TBEC
40.0000 mg | DELAYED_RELEASE_TABLET | Freq: Every day | ORAL | Status: DC
Start: 2013-02-10 — End: 2013-02-10
  Administered 2013-02-10: 40 mg via ORAL
  Filled 2013-02-10: qty 1

## 2013-02-10 MED ORDER — LORAZEPAM 2 MG/ML IJ SOLN
1.0000 mg | Freq: Four times a day (QID) | INTRAMUSCULAR | Status: DC | PRN
Start: 2013-02-10 — End: 2013-02-10

## 2013-02-10 MED ORDER — SUCRALFATE 1 GM/10ML PO SUSP
1.0000 g | Freq: Four times a day (QID) | ORAL | Status: DC
Start: 2013-02-10 — End: 2013-02-10
  Administered 2013-02-10 (×2): 1 g via ORAL
  Filled 2013-02-10 (×2): qty 10

## 2013-02-10 MED ORDER — POTASSIUM CHLORIDE 10 MEQ/100ML IV SOLN
10.0000 meq | INTRAVENOUS | Status: AC
Start: 2013-02-10 — End: 2013-02-10
  Administered 2013-02-10 (×2): 10 meq via INTRAVENOUS
  Filled 2013-02-10: qty 200

## 2013-02-10 MED ORDER — INSULIN GLARGINE 100 UNIT/ML SC SOLN
10.0000 [IU] | Freq: Every morning | SUBCUTANEOUS | Status: DC
Start: 2013-02-11 — End: 2013-02-10

## 2013-02-10 MED ORDER — NORTRIPTYLINE HCL 10 MG PO CAPS
10.0000 mg | ORAL_CAPSULE | Freq: Every evening | ORAL | Status: DC
Start: 2013-02-10 — End: 2013-02-20

## 2013-02-10 NOTE — Plan of Care (Signed)
Care plan adequate for discharge. See discharge note.

## 2013-02-10 NOTE — Progress Notes (Signed)
Patient seen and discussed with Dr. Army Melia.  Physically present for her exam, pertinent portions directly duplicated and agree with her findings.  Feels better today, tolerating clear liquids with no nausea or vomiting.  Tolerated initial dose of nortriptyline well.  Agree with advance to full liquid diet, monitor closely, change Ativan to prn.  Anticipate D/C later today or tomorrow.   Agree with assessment and plan per Dr. Army Melia.

## 2013-02-10 NOTE — Progress Notes (Signed)
Patient discharged home. No special discharge needs. IV and tele discontinued. Discharge instructions, education and prescriptions given to patient and reviewed. Patient has no questions at this time. Patient to follow up with GI asap. Patient left unit via wheelchair transport.

## 2013-02-10 NOTE — Discharge Instructions (Signed)
Please follow the diet recommendations for gastroparesis and diabetic diet   If you are tolerating regular diet please take lantus 10 units and increase by 2 units daily until 22 units if tolerating regular diet     I would recommend that you stop erythromycin as it is giving you diarrhea and we save it for when you need in the hospital   - Continue Reglan for motility   - continue Zofran or phenergan as needed for nausea   - Continue Nortriptyline   - Would avoid use of narcotics at home as they can worsen the problem     All of these medications prolong the QT interval once of your heart intervals - You will need a EKG rechecked in 1-2 weeks by Dr. Alben Spittle in the office     Would recommend eating small frequent meals until able to tolerate    Follow up with GI as discussed and consider testing for bacterial overgrowth  In addition you can discuss starting a medication called Sancuso ( patch for nausea) as discussed or relistor which may help with your symptoms       Gastroparesis  Gastroparesis (also called delayed gastric emptying) occurs when the stomach takes longer than normal to empty of food. This is due to a problem with motility (the movement of the muscles in the digestive tract). For many people, gastroparesis is a lifelong condition. But treatment can help relieve symptoms and prevent complications. Read on to learn more about gastroparesis and how it can be managed.  How Gastroparesis Develops     Gastroparesis means that food and fluids move too slowly out of the stomach into the duodenum.   With normal motility, signals from nerves tell the stomach muscles when to contract. These muscles move food from the stomach into the duodenum (the first part of the small bowel). With gastroparesis, the nerves or muscles are damaged. This causes motility to slow down or stop completely. As a result, food cannot move from the stomach properly. This delayed emptying can cause nausea, vomiting, and other symptoms.  Malnutrition can result. Bezoars (hardened lumps of food) can form in the stomach and cause other complications as well.  Causes of Gastroparesis  Gastroparesis can be caused by any of the following:   Diabetes   Surgery involving any of the digestive organs, such as the stomach and bowels   Certain medications, such as strong pain medications (narcotics)   Certain conditions, such as systemic scleroderma, Parkinson's disease, and thyroid disease  In many cases, the cause of gastroparesis cannot be found.  Signs and Symptoms of Gastroparesis  These can include:   Nausea and vomiting   Feeling full quickly when eating   Abdominal pain   Heartburn   Abdominal bloating   Weight loss   Loss of appetite   High and low blood sugar levels (in people withdiabetes)  Diagnosing Gastroparesis  Your doctor will ask about your symptoms and health history. You'll also be examined. In addition, blood tests and X-rays are often done to check your health and rule out other problems. To confirm the problem, you may need other tests as well. These can include:   Upper endoscopy.This is doneto see inside the stomach and duodenum. For the test, an endoscope is used. This is a thin, flexible tube with a tiny camera on the end. It's inserted through the mouth and down into the stomach and duodenum.   Upper gastrointestinal (GI) series.This is doneto take X-rays of  the upper GI tract from the mouth to the small bowel. For the test, a substance called barium is used. The barium coats the upper GI tract so that it will show up clearly on X-rays.   Gastric emptying scan.This is done to measure how quickly food leaves the stomach. For the test, a meal containing a harmless radioactive substance (tracer) is eaten. Then scans of the stomach are done. The tracer shows up clearly on the scans and shows the movement of the food through the stomach.  Treating Gastroparesis  The goal of treatment is to help you manage your  condition. Treatment may include one or more of the following:   Dietary changes.You may need to make changes to your eating habits and daily diet. For instance, your doctor may instruct you to eat small meals throughout the day. Doing this can keep you from feeling full too quickly. You may be placed on a liquid or "soft" diet. This means you'll eat liquid foods or foods that are mashed or put through a blender. In addition, you may need to avoid foods high in fats and fiber. These can slow digestion. For more help with your diet, your doctor can refer you to a dietitian. In severe cases, you may need a feeding tube. This sends liquid food or medication directly to your small bowel, bypassing the stomach.   Medications.These can help manage symptoms, such as nausea and vomiting. They can also improve motility. Each medication has specific risks and side effects. Your doctor can tell you more about any medication that is prescribed for you.   Surgery.You may need to have a tube surgically inserted into the stomach. The tube removes excess air and fluid. This can relieve severe symptoms of nausea and vomiting. In rare cases, other surgery may be needed on the stomach or small bowel. This is to create a new passageway for food to be emptied from the stomach.   Other treatments.These include botulin toxin injection and gastric electrical stimulation. They are done less often and may not be available. Your doctor can tell you more about these treatments if they are options for you.  Diabetes and Gastroparesis  If you have diabetes, gastroparesis can make it harder to manage your blood sugar level. You'll need to take extra steps in your treatment to prevent complications. Work with your doctor to learn what you can do to protect your health. For more information, contact the American Diabetes Association,www.diabetes.org.   Long-term Concerns  With treatment, most people can manage their symptoms and maintain  their usual routines. If your symptoms are moderate to severe, you may need to see your doctor more frequently for checkups. Also, other treatments will likely be needed.   [address], [place], [place] [address]. All rights reserved. This information is not intended as a substitute for professional medical care. Always follow your healthcare professional's instructions.

## 2013-02-10 NOTE — Consults (Signed)
NUTRITION EDUCATION CONSULT:    Consult received for diabetes and gastroparesis education. Patient provided verbal and written instruction. Contact information also provided; patient encouraged to call with any further questions.     Please call with any further nutrition related needs.    Sheli Azell Bill, RD  [phone]

## 2013-02-10 NOTE — Progress Notes (Signed)
FAMILY MEDICINE PROGRESS NOTE    Date Time: 02/10/2013 7:34 AM  Patient Name: [name]  Attending Physician: Doyle Askew, MD  Team Contact Info: [facility] [phone] 8 am -5 pm: After hours [phone]      Assessment:     Active Hospital Problems    Diagnosis   . Abdominal pain   . Diabetic gastroparesis   . Benign essential hypertension   . Type 2 diabetes, uncontrolled, with neuropathy   . Nausea & vomiting       37 y/o male h/o gastroparesis with multiple prior admissions, uncontrolled IDDM, HTN presents with intractable nausea/vomit and epigastric pain. Recently discharged for similar symptoms     Plan:   #Nausea/Vomit secondary to persistent gastroparesis flare:    -no leukocytosis, normal LFTs     -s/p 1.5 mg IV Dilaudid in ER. Will try to minimize narcotics but to adequately address pain    -s/p 2 L bolus, continue maintenance IVFs    -continue regimen from previous hospitalization including     Reglan 10 mg IV TID scheduled Erythromycin 100 IV q8h scheduled     Carafate 1 gm PO QID when tolerating PO     Transition to Protonix 40 mg PO qd     Ativan 1 mg q6h PRN  with Benadryl 25 mg IV qhs    - Advance to clear liquid diet    - Obtain follow up EKG with history of QT prolongation - will need follow up EKG in the office in 2-4 weeks    - Appreciate GI recommendations - patient is having diarrhea so will hold on miralax    - Consider Sancuso patch as outpatient but patient will check with insurance prior to initiation after January 1st     #AKI- likely pre-renal - resolved    -IVFs as above    -recheck BMP in am     #IDDM, uncontrolled:    -SSI while NPO    -restart Lantus 10 units - home regimen 36 units qhs     #HTN:    -continue home regimen when tolerates PO- lisinopril 40 daily, metoprolol 25 mg BID, amlodipine 10 mg daily     Diet: Clears   Ppx: SCDs, SQ Lovenox   Code: Full     Dispo: Admit for inpatient Possible discharge tomorrow       Disposition:     Anticipated discharge needs: none        Subjective     CC: Vomiting     Interval History/24 hour events:   Feels minimal improvement. No side effects noted from Pamelor. Patient denies chest pain, Shortness of breath. Abdominal pain is mild but still with persistent emesis. He is having diarrhea       Physical Exam:       VITAL SIGNS PHYSICAL EXAM   Temp:  [97.2 F (36.2 C)-98.3 F (36.8 C)] 97.2 F (36.2 C)  Heart Rate:  [79-101] 101   Resp Rate:  [16-18] 18   BP: (144-185)/(79-102) 156/87 mmHg         Intake/Output Summary (Last 24 hours) at 02/10/13 0734  Last data filed at 02/10/13 2956   Gross per 24 hour   Intake 3562.5 ml   Output      0 ml   Net 3562.5 ml    Physical Exam  Gen: Awake alert intermittent distress with vomiting  HEENT: MMM  Cardiac: RRR no Murmurs, rubs, gallops   Lungs: CTAB no rales  Abdomen: + BS soft minimal tenderness to palpation, non distended   Ext: + 2 DP, no edema          Meds:     Medications were reviewed.    Labs:       Labs (last 72 hours):      Lab 02/08/13 2231 02/06/13 0632   WBC 10.17 6.62   HGB 14.9 12.3*   HCT 43.3 37.1*   PLT 330 280       No results found for this basename: PT:2,INR:2,PTT:2 in the last 168 hours   Lab 02/10/13 0431 02/08/13 2231   NA 140 138   K 3.3* 3.8   CL 106 101   CO2 25 24   BUN 8.0* 14.0   CREAT 1.1 1.5*   CA 8.6 9.9   ALB -- 4.3   PROT -- 7.8   BILITOTAL -- 0.4   ALKPHOS -- 83   ALT -- 22   AST -- 18   GLU 119* 166*                 Microbiology, reviewed and are significant for:  None      Imaging personally reviewed, including:   None      Case discussed with: Dr.Burroughs    Signed by: Kristine Linea, MD PGY-2  Pager # [phone]  [facility]   [address]   [place], [place] [address]

## 2013-02-10 NOTE — Plan of Care (Signed)
Problem: Pain  Goal: Patient's pain/discomfort is manageable  Outcome: Progressing  Patient has no complaints of pain at this time. Instructed patient to ask for pain medication when needed. Will continue to monitor pain and medicate as needed.       Comments:   Pt alert and oriented x 4. Tolerating low fiber diet well. Denies any nausea or vomiting. Denies abd pain.  Ambulating in hallway. Voiding without difficulty. C/o of diarrhea. Pt discussed with MD when they did rounds this morning.  Pt has no other complaint at this moment. Call bell within reach. Will continue to monitor closely.

## 2013-02-10 NOTE — Discharge Summary (Signed)
FAMILY MEDICINE DISCHARGE SUMMARY    Date Time: 02/10/2013 9:14 PM  Patient Name: [name]  Attending Physician: Dr Rodney Booze  Primary Care Physician: [name], MD    Date of Admission: 02/08/2013  Date of Discharge: 02/10/13    Discharge Diagnoses:     Principal Problem:   *Abdominal pain  Active Problems:   GERD (gastroesophageal reflux disease)   Type 2 diabetes, uncontrolled, with neuropathy   Nausea & vomiting   Benign essential hypertension   Diabetic gastroparesis  Resolved Problems:   * No resolved hospital problems. *       Disposition:     Home     Pending Results, Recommendations & Instructions to providers after discharge:     1. Micro / Labs / Path pending: None   2. Wound Care Instructions: None   3. Date of completion for antibiotics or other medications: None   4. Other: EKG needed in 1-2 weeks after being on Reglan and Nortriptyline  5. Follow up recommended with [facility] Specialist at [facility] ( Dr Burton Apley) or In [place] at [facility].  6. Lantus has been decreased for patient to increase slowly as tolerated given decreased intake. Instructed to start with 10 units daily and increase by 1 unit daily for blood sugar > 120 fasting in the morning   7. Gabapentin was continued for diabetic neuropathy until pamelor can reach steady state but may consider tapering off   8. Tramadol was held given increased risk of seizures with Pamelor     Recent Labs - Last 2:         Lab 02/08/13 2231 02/06/13 0632   WBC 10.17 6.62   HGB 14.9 12.3*   HCT 43.3 37.1*   PLT 330 280       No results found for this basename: PT,INR,PTT in the last 168 hours    Lab 02/08/13 2231   ALKPHOS 83   BILITOTAL 0.4   BILIDIRECT 0.2   PROT 7.8   ALB 4.3   ALT 22   AST 18        Lab 02/10/13 0431 02/10/13 0426 02/08/13 2231 02/08/13 0634   NA 140 -- 138 --   K 3.3* -- 3.8 --   CL 106 -- 101 --   CO2 25 -- 24 --   BUN 8.0* -- 14.0 --   GLU 119* -- 166* --   CA 8.6 -- 9.9 --   MG -- 1.8 -- 1.9   PHOS -- -- -- --        No results found for this basename: CHOL,TRIG,HDL,LDL in the last 168 hours  No results found for this basename: TSH,FREET3,FREET4 in the last 168 hours         Procedures/Radiology performed:     None       Hospital Course:     Reason for admission/ HPI:   HPI copied from Dr Georgina Snell [facility]    [name] is a 37 y.o. male h/o gastroparesis with multiple prior admissions, uncontrolled IDDM, HTN presents with intractable nausea/vomit and epigastric pain since discharged from hospital today. After discharge, he went home and ate a bowl of soup, pretzels and drank some gingerale. He states he thinks he might have "overdone it". Epigastric pain, nausea, non-bloody, non-bilious vomiting returned. No fever/chills, diarrhea, shortness of breath, chest discomfort.   In ER, 2 L bolus IVF, 1.5 mg Dilaudid, 1 mg Ativan, 4 mg Zofran, 12.5 mg Phenergan were  administered. Patient continues to dry heave and complain of epigastric discomfort.       Hospital Course:    Patient was started on recently discharged medications which included phenergan scheduled, reglan, erythromycin, protonix. He was started on Pamelor after QT was monitored and was normal prior to starting. He had improvement and was discharged on Reglan and Pamelor for Gastroparesis and recommended to use phenergan or zofran as needed for Nausea. Recommended continue treatment with protonix and carafate. Recommended avoiding narcotics as it could worsen illness.    Lantus was held while patient was on minimal oral intake. At discharge recommended he start 10 units and to increase by 1 unit daily for every fasting sugar > 120.     Discharge Day Exam:  Temp:  [97.2 F (36.2 C)-98.6 F (37 C)] 98.6 F (37 C)  Heart Rate:  [71-101] 89   Resp Rate:  [16-18] 16   BP: (142-178)/(87-92) 142/87 mmHg  Gen: Awake alert intermittent distress with vomiting   HEENT: MMM   Cardiac: RRR no Murmurs, rubs, gallops   Lungs: CTAB no rales   Abdomen: + BS soft minimal  tenderness to palpation, non distended   Ext: + 2 DP, no edema       Consultations:     Treatment Team: Consulting Physician: Lestine Mount, MD; Registered Nurse: Loraine Maple, RN    Discharge Condition:   Improved    Discharge Instructions & Follow Up Plan for Patient:     Diet:Diabetic gastroparesis diet     Activity/Weight Bearing Status:As tolerated     Patient was instructed to follow up with:   Primary Care Doctor [name], MD in  1 week & the following Consultants:  1. GI in 1 week     Discharge Code Status: Full Code   Patient Emergency Contact    Complete instructions and follow up are in the patient's After Visit Summary    Minutes spent coordinating discharge and reviewing discharge plan: 60 minutes    Discharge Medications:        Medication List       As of 02/10/2013  9:14 PM      START taking these medications           metoclopramide 5 MG tablet    Commonly known as: REGLAN    Take 2 tablets (10 mg total) by mouth 3 (three) times daily.        nortriptyline 10 MG capsule    Commonly known as: PAMELOR    Take 1 capsule (10 mg total) by mouth nightly.         CHANGE how you take these medications           insulin glargine 100 UNIT/ML injection    Commonly known as: LANTUS    Inject 10 Units into the skin nightly. Check am fasting sugars. Increase by 1 unit every morning for readings >120. For max of 30 units    What changed:  - dose  - doctor's instructions         CONTINUE taking these medications           amLODIPine 5 MG tablet    Commonly known as: NORVASC    Take 2 tablets (10 mg total) by mouth daily.        gabapentin 300 MG capsule    Commonly known as: NEURONTIN        lisinopril 40 MG tablet    Commonly known as: PRINIVIL,ZESTRIL  Take 1 tablet (40 mg total) by mouth daily.        LORazepam 0.5 MG tablet    Commonly known as: ATIVAN    Take 1 tablet (0.5 mg total) by mouth every 6 (six) hours as needed. PRN cyclic vomiting        metoprolol 25 MG tablet    Commonly known as:  LOPRESSOR    Take 1 tablet (25 mg total) by mouth every 12 (twelve) hours.        pantoprazole 40 MG tablet    Commonly known as: PROTONIX        polyethylene glycol packet    Commonly known as: MIRALAX    Take 17 g by mouth 2 (two) times daily.        promethazine 25 MG tablet    Commonly known as: PHENERGAN    Take 1 tablet (25 mg total) by mouth every 6 (six) hours as needed.        sucralfate 1 G tablet    Commonly known as: CARAFATE    Take 1 tablet (1 g total) by mouth 4 (four) times daily.         STOP taking these medications           erythromycin 250 MG tablet    Commonly known as: ERYTHROCIN        traMADol 50 MG tablet    Commonly known as: ULTRAM               Where to get your medications     These are the prescriptions that you need to pick up.    You may get these medications from any pharmacy.           insulin glargine 100 UNIT/ML injection    nortriptyline 10 MG capsule             Information on where to get these meds is not yet available. Ask your nurse or doctor.           metoclopramide 5 MG tablet                   Immunizations provided:      [facility]   [address]   [place], [place] [address]  [phone]    Signed by: Kristine Linea, MD    CC: [name], MD

## 2013-02-16 ENCOUNTER — Inpatient Hospital Stay
Admission: EM | Admit: 2013-02-16 | Discharge: 2013-02-20 | DRG: 074 | Disposition: A | Discharge status: Home / Self Care | Payer: BC Managed Care – PPO | Attending: Family Medicine | Admitting: Family Medicine

## 2013-02-16 ENCOUNTER — Inpatient Hospital Stay: Payer: BC Managed Care – PPO | Admitting: Family Medicine

## 2013-02-16 ENCOUNTER — Inpatient Hospital Stay: Payer: BC Managed Care – PPO

## 2013-02-16 DIAGNOSIS — R109 Unspecified abdominal pain: Secondary | ICD-10-CM | POA: Diagnosis present

## 2013-02-16 DIAGNOSIS — K3184 Gastroparesis: Secondary | ICD-10-CM | POA: Diagnosis present

## 2013-02-16 DIAGNOSIS — K2970 Gastritis, unspecified, without bleeding: Secondary | ICD-10-CM | POA: Diagnosis present

## 2013-02-16 DIAGNOSIS — E1149 Type 2 diabetes mellitus with other diabetic neurological complication: Principal | ICD-10-CM

## 2013-02-16 DIAGNOSIS — E1143 Type 2 diabetes mellitus with diabetic autonomic (poly)neuropathy: Secondary | ICD-10-CM | POA: Diagnosis present

## 2013-02-16 DIAGNOSIS — R9431 Abnormal electrocardiogram [ECG] [EKG]: Secondary | ICD-10-CM | POA: Diagnosis present

## 2013-02-16 DIAGNOSIS — Z833 Family history of diabetes mellitus: Secondary | ICD-10-CM

## 2013-02-16 DIAGNOSIS — I10 Essential (primary) hypertension: Secondary | ICD-10-CM | POA: Diagnosis present

## 2013-02-16 DIAGNOSIS — K5900 Constipation, unspecified: Secondary | ICD-10-CM | POA: Diagnosis present

## 2013-02-16 DIAGNOSIS — K219 Gastro-esophageal reflux disease without esophagitis: Secondary | ICD-10-CM | POA: Diagnosis present

## 2013-02-16 DIAGNOSIS — K2990 Gastroduodenitis, unspecified, without bleeding: Secondary | ICD-10-CM | POA: Diagnosis present

## 2013-02-16 DIAGNOSIS — R1110 Vomiting, unspecified: Secondary | ICD-10-CM

## 2013-02-16 DIAGNOSIS — E1142 Type 2 diabetes mellitus with diabetic polyneuropathy: Secondary | ICD-10-CM | POA: Diagnosis present

## 2013-02-16 DIAGNOSIS — IMO0002 Reserved for concepts with insufficient information to code with codable children: Secondary | ICD-10-CM | POA: Diagnosis present

## 2013-02-16 LAB — CBC AND DIFFERENTIAL
Basophils Absolute Automated: 0.03 10*3/uL (ref 0.00–0.20)
Basophils Automated: 0 %
Eosinophils Absolute Automated: 0.03 10*3/uL (ref 0.00–0.70)
Eosinophils Automated: 0 %
Hematocrit: 39.4 % — ABNORMAL LOW (ref 42.0–52.0)
Hgb: 12.9 g/dL — ABNORMAL LOW (ref 13.0–17.0)
Immature Granulocytes Absolute: 0.02 10*3/uL
Immature Granulocytes: 0 %
Lymphocytes Absolute Automated: 1.04 10*3/uL (ref 0.50–4.40)
Lymphocytes Automated: 13 %
MCH: 26.8 pg — ABNORMAL LOW (ref 28.0–32.0)
MCHC: 32.7 g/dL (ref 32.0–36.0)
MCV: 81.9 fL (ref 80.0–100.0)
MPV: 10.1 fL (ref 9.4–12.3)
Monocytes Absolute Automated: 0.31 10*3/uL (ref 0.00–1.20)
Monocytes: 4 %
Neutrophils Absolute: 6.67 10*3/uL (ref 1.80–8.10)
Neutrophils: 82 %
Nucleated RBC: 0 (ref 0–1)
Platelets: 323 10*3/uL (ref 140–400)
RBC: 4.81 10*6/uL (ref 4.70–6.00)
RDW: 13 % (ref 12–15)
WBC: 8.1 10*3/uL (ref 3.50–10.80)

## 2013-02-16 LAB — ECG 12-LEAD
Atrial Rate: 92 {beats}/min
P Axis: 50 degrees
P-R Interval: 176 ms
Q-T Interval: 356 ms
QRS Duration: 88 ms
QTC Calculation (Bezet): 440 ms
R Axis: 39 degrees
T Axis: 53 degrees
Ventricular Rate: 92 {beats}/min

## 2013-02-16 LAB — BASIC METABOLIC PANEL
BUN: 11 mg/dL (ref 9.0–21.0)
CO2: 23 mEq/L (ref 22–29)
Calcium: 9.5 mg/dL (ref 8.5–10.5)
Chloride: 102 mEq/L (ref 98–107)
Creatinine: 1.2 mg/dL (ref 0.7–1.3)
Glucose: 310 mg/dL — ABNORMAL HIGH (ref 70–100)
Potassium: 4 mEq/L (ref 3.5–5.1)
Sodium: 138 mEq/L (ref 136–145)

## 2013-02-16 LAB — URINALYSIS WITH MICROSCOPIC
Bilirubin, UA: NEGATIVE
Glucose, UA: >1000 — AB
Ketones UA: 10
Leukocyte Esterase, UA: NEGATIVE
Nitrite, UA: NEGATIVE
Protein, UR: 30 — AB
Specific Gravity UA: 1.025 (ref 1.001–1.035)
Urine pH: 6 (ref 5.0–8.0)
Urobilinogen, UA: NORMAL mg/dL

## 2013-02-16 LAB — HEPATIC FUNCTION PANEL
ALT: 19 U/L (ref 0–55)
AST (SGOT): 13 U/L (ref 5–34)
Albumin/Globulin Ratio: 1.3 (ref 0.9–2.2)
Albumin: 3.8 g/dL (ref 3.5–5.0)
Alkaline Phosphatase: 76 U/L (ref 40–150)
Bilirubin Direct: 0.3 mg/dL (ref 0.0–0.5)
Bilirubin Indirect: 0.3 mg/dL (ref 0.0–1.1)
Bilirubin, Total: 0.6 mg/dL (ref 0.2–1.2)
Globulin: 3 g/dL (ref 2.0–3.6)
Protein, Total: 6.8 g/dL (ref 6.0–8.3)

## 2013-02-16 LAB — GFR: EGFR: >60

## 2013-02-16 LAB — GLUCOSE WHOLE BLOOD - POCT
Whole Blood Glucose POCT: 196 mg/dL — ABNORMAL HIGH (ref 70–100)
Whole Blood Glucose POCT: 149 mg/dL — ABNORMAL HIGH (ref 70–100)
Whole Blood Glucose POCT: 159 mg/dL — ABNORMAL HIGH (ref 70–100)

## 2013-02-16 LAB — LIPASE: Lipase: 49 U/L (ref 8–78)

## 2013-02-16 MED ORDER — LISINOPRIL 20 MG PO TABS
ORAL_TABLET | ORAL | Status: AC
Start: 2013-02-16 — End: 2013-02-16
  Administered 2013-02-16: 40 mg via ORAL
  Filled 2013-02-16: qty 2

## 2013-02-16 MED ORDER — PROMETHAZINE HCL 25 MG PO TABS
25.0000 mg | ORAL_TABLET | Freq: Four times a day (QID) | ORAL | Status: DC | PRN
Start: 2013-02-16 — End: 2013-02-19

## 2013-02-16 MED ORDER — SODIUM CHLORIDE 0.9 % IV BOLUS
2000.0000 mL | Freq: Once | INTRAVENOUS | Status: AC
Start: 2013-02-16 — End: 2013-02-16
  Administered 2013-02-16: 2000 mL via INTRAVENOUS

## 2013-02-16 MED ORDER — PANTOPRAZOLE SODIUM 40 MG IV SOLR
40.0000 mg | Freq: Every day | INTRAVENOUS | Status: DC
Start: 2013-02-16 — End: 2013-02-20
  Administered 2013-02-17 – 2013-02-20 (×4): 40 mg via INTRAVENOUS
  Filled 2013-02-16 (×4): qty 40

## 2013-02-16 MED ORDER — POLYETHYLENE GLYCOL 3350 17 G PO PACK
17.0000 g | PACK | Freq: Two times a day (BID) | ORAL | Status: DC
Start: 2013-02-16 — End: 2013-02-20
  Administered 2013-02-16 – 2013-02-20 (×5): 17 g via ORAL
  Filled 2013-02-16 (×6): qty 1

## 2013-02-16 MED ORDER — PANTOPRAZOLE SODIUM 40 MG IV SOLR
INTRAVENOUS | Status: AC
Start: 2013-02-16 — End: 2013-02-16
  Administered 2013-02-16: 40 mg via INTRAVENOUS
  Filled 2013-02-16: qty 40

## 2013-02-16 MED ORDER — ENOXAPARIN SODIUM 40 MG/0.4ML SC SOLN
40.0000 mg | Freq: Every day | SUBCUTANEOUS | Status: DC
Start: 2013-02-16 — End: 2013-02-20
  Administered 2013-02-18 – 2013-02-20 (×3): 40 mg via SUBCUTANEOUS
  Filled 2013-02-16 (×6): qty 0.4

## 2013-02-16 MED ORDER — HYDROMORPHONE HCL PF 1 MG/ML IJ SOLN
1.0000 mg | INTRAMUSCULAR | Status: DC | PRN
Start: 2013-02-16 — End: 2013-02-17
  Administered 2013-02-16 – 2013-02-17 (×5): 1 mg via INTRAVENOUS
  Filled 2013-02-16 (×5): qty 1

## 2013-02-16 MED ORDER — HYDROMORPHONE HCL PF 1 MG/ML IJ SOLN
1.0000 mg | Freq: Once | INTRAMUSCULAR | Status: AC
Start: 2013-02-16 — End: 2013-02-16
  Administered 2013-02-16: 1 mg via INTRAVENOUS
  Filled 2013-02-16: qty 1

## 2013-02-16 MED ORDER — ONDANSETRON HCL 4 MG/2ML IJ SOLN
4.0000 mg | Freq: Once | INTRAMUSCULAR | Status: AC
Start: 2013-02-16 — End: 2013-02-16
  Administered 2013-02-16: 4 mg via INTRAVENOUS
  Filled 2013-02-16: qty 2

## 2013-02-16 MED ORDER — METOPROLOL TARTRATE 25 MG PO TABS
ORAL_TABLET | ORAL | Status: AC
Start: 2013-02-16 — End: 2013-02-16
  Administered 2013-02-16: 25 mg via ORAL
  Filled 2013-02-16: qty 1

## 2013-02-16 MED ORDER — METOCLOPRAMIDE HCL 5 MG/ML IJ SOLN
INTRAMUSCULAR | Status: AC
Start: 2013-02-16 — End: 2013-02-16
  Administered 2013-02-16: 10 mg via INTRAVENOUS
  Filled 2013-02-16: qty 2

## 2013-02-16 MED ORDER — AMLODIPINE BESYLATE 5 MG PO TABS
ORAL_TABLET | ORAL | Status: AC
Start: 2013-02-16 — End: 2013-02-16
  Administered 2013-02-16: 10 mg via ORAL
  Filled 2013-02-16: qty 2

## 2013-02-16 MED ORDER — PROMETHAZINE HCL 25 MG/ML IJ SOLN
12.5000 mg | Freq: Four times a day (QID) | INTRAMUSCULAR | Status: DC | PRN
Start: 2013-02-16 — End: 2013-02-19
  Administered 2013-02-16 – 2013-02-18 (×7): 12.5 mg via INTRAVENOUS
  Filled 2013-02-16 (×7): qty 1

## 2013-02-16 MED ORDER — DEXTROSE 50 % IV SOLN
25.0000 mL | INTRAVENOUS | Status: DC | PRN
Start: 2013-02-16 — End: 2013-02-20

## 2013-02-16 MED ORDER — SUCRALFATE 1 G PO TABS
1.0000 g | ORAL_TABLET | Freq: Four times a day (QID) | ORAL | Status: DC
Start: 2013-02-16 — End: 2013-02-20
  Administered 2013-02-17 – 2013-02-20 (×10): 1 g via ORAL
  Filled 2013-02-16 (×18): qty 1

## 2013-02-16 MED ORDER — GLUCOSE 40 % PO GEL
15.0000 g | ORAL | Status: DC | PRN
Start: 2013-02-16 — End: 2013-02-20

## 2013-02-16 MED ORDER — PROMETHAZINE HCL 25 MG/ML IJ SOLN
12.5000 mg | Freq: Once | INTRAMUSCULAR | Status: AC
Start: 2013-02-16 — End: 2013-02-16
  Administered 2013-02-16: 12.5 mg via INTRAVENOUS
  Filled 2013-02-16: qty 1

## 2013-02-16 MED ORDER — SODIUM CHLORIDE 0.9 % IV SOLN
250.0000 mg | Freq: Three times a day (TID) | INTRAVENOUS | Status: DC
Start: 2013-02-16 — End: 2013-02-20
  Administered 2013-02-16 – 2013-02-20 (×13): 250 mg via INTRAVENOUS
  Filled 2013-02-16 (×15): qty 250

## 2013-02-16 MED ORDER — PROMETHAZINE HCL 25 MG/ML IJ SOLN
12.5000 mg | Freq: Once | INTRAMUSCULAR | Status: AC
Start: 2013-02-16 — End: 2013-02-16
  Administered 2013-02-16: 12.5 mg via INTRAMUSCULAR
  Filled 2013-02-16: qty 1

## 2013-02-16 MED ORDER — SODIUM CHLORIDE 0.9 % IV SOLN
INTRAVENOUS | Status: DC
Start: 2013-02-16 — End: 2013-02-20

## 2013-02-16 MED ORDER — HYDROMORPHONE HCL PF 1 MG/ML IJ SOLN
INTRAMUSCULAR | Status: AC
Start: 2013-02-16 — End: 2013-02-16
  Administered 2013-02-16: 1 mg via INTRAVENOUS
  Filled 2013-02-16: qty 1

## 2013-02-16 MED ORDER — GLUCAGON HCL (RDNA) 1 MG IJ SOLR
1.0000 mg | INTRAMUSCULAR | Status: DC | PRN
Start: 2013-02-16 — End: 2013-02-20

## 2013-02-16 MED ORDER — METOPROLOL TARTRATE 25 MG PO TABS
25.0000 mg | ORAL_TABLET | Freq: Two times a day (BID) | ORAL | Status: DC
Start: 2013-02-16 — End: 2013-02-18
  Administered 2013-02-16 – 2013-02-17 (×2): 25 mg via ORAL
  Filled 2013-02-16 (×4): qty 1

## 2013-02-16 MED ORDER — NORTRIPTYLINE HCL 10 MG PO CAPS
10.0000 mg | ORAL_CAPSULE | Freq: Every evening | ORAL | Status: DC
Start: 2013-02-16 — End: 2013-02-18
  Administered 2013-02-16: 10 mg via ORAL
  Filled 2013-02-16 (×3): qty 1

## 2013-02-16 MED ORDER — INSULIN ASPART 100 UNIT/ML SC SOLN
1.0000 [IU] | SUBCUTANEOUS | Status: DC | PRN
Start: 2013-02-16 — End: 2013-02-20
  Administered 2013-02-16: 1 [IU] via SUBCUTANEOUS
  Administered 2013-02-17 – 2013-02-20 (×3): 3 [IU] via SUBCUTANEOUS
  Administered 2013-02-20: 1 [IU] via SUBCUTANEOUS
  Filled 2013-02-16: qty 10
  Filled 2013-02-16: qty 30

## 2013-02-16 MED ORDER — AMLODIPINE BESYLATE 5 MG PO TABS
10.0000 mg | ORAL_TABLET | Freq: Every day | ORAL | Status: DC
Start: 2013-02-16 — End: 2013-02-20
  Administered 2013-02-17 – 2013-02-20 (×3): 10 mg via ORAL
  Filled 2013-02-16 (×4): qty 2

## 2013-02-16 MED ORDER — NALOXONE HCL 0.4 MG/ML IJ SOLN
0.2000 mg | INTRAMUSCULAR | Status: DC | PRN
Start: 2013-02-16 — End: 2013-02-20

## 2013-02-16 MED ORDER — METOCLOPRAMIDE HCL 5 MG/ML IJ SOLN
10.0000 mg | Freq: Three times a day (TID) | INTRAMUSCULAR | Status: DC
Start: 2013-02-16 — End: 2013-02-20
  Administered 2013-02-16 – 2013-02-20 (×13): 10 mg via INTRAVENOUS
  Filled 2013-02-16 (×13): qty 2

## 2013-02-16 MED ORDER — LISINOPRIL 10 MG PO TABS
40.0000 mg | ORAL_TABLET | Freq: Every day | ORAL | Status: DC
Start: 2013-02-16 — End: 2013-02-20
  Administered 2013-02-17 – 2013-02-20 (×3): 40 mg via ORAL
  Filled 2013-02-16 (×4): qty 4

## 2013-02-16 MED ORDER — HYDRALAZINE HCL 20 MG/ML IJ SOLN
5.0000 mg | Freq: Four times a day (QID) | INTRAMUSCULAR | Status: DC | PRN
Start: 2013-02-16 — End: 2013-02-20
  Administered 2013-02-17 – 2013-02-18 (×3): 5 mg via INTRAVENOUS
  Filled 2013-02-16 (×3): qty 1

## 2013-02-16 NOTE — ED Notes (Signed)
IV infiltrated, IVF paused and IV removed with catheter tip intact. Pt will need new sono IV.

## 2013-02-16 NOTE — Plan of Care (Signed)
Problem: Health Promotion  Goal: Knowledge - disease process  Extent of understanding conveyed about a specific disease process.   Outcome: Progressing  Pt admit from ED with abdominal pain, nausea, vomiting. Since admit to unit, pt has IVF. IV pain medication for abdominal pain. 1 episode of emesis. IV abx administered. Falls precautions in place.

## 2013-02-16 NOTE — Progress Notes (Signed)
Family Medicine Progress Note    Date Time: 02/16/2013 10:03 AM  Patient Name: [name], [name]  Team Contact Info: [facility] #[id number] 8 am -5 pm: After hours [phone]      Assessment:   [name] with uncontrolled diabetes and diabetic neuropathy, gastroparesis, HTN presents with nausea and vomiting, likely from gastroparesis exacerbation.     Plan:   Nausea and vomiting, 2/2 gastroparesis exacerbation   - Check LFTs   - AXR to r/o obstruction   - mIVF 150 cc/hr   - Reglan 10 mg TID standing, Erythromycin 250 mg TID standing   - Phenergan IV/PO prn   - Continue Pamelor 10 mg   - Consider sancuso (long acting patch anti-emetic), to be used as an outpatient   - Miralax daily to help promote bowel movement   - Protonix and carafate 1 g QID   - Will consult GI if not improving on previously recommended regimen     History of QT prolongation   - Check EKG now   - telemetry monitoring     Hypertension, was not taking home metoprolol and likely rebound from restarting clonidine  - Lisinopril 40mg  daily   - Amlodipine 10mg  daily   - Metoprolol 25mg  BID  - Hydralazine PRN for SBP >180     Diabetes   - Hold home medications while NPO   - UA with significant glucose but minimal ketones and no anion gap   - Trace blood, will repeat UA tomorrow   - Insulin SSI   - Restart lantus when patient able to tolerate PO (sent home from recent hospitalization on 10 units given decreased appetite, prior dose was 33 units)   - Continue gabapentin for diabetic neuropathy with plan to taper as outpatient once nortriptyline reaches steady state     FEN/GI: NPO, NS   PPX: Lovenox, GI with home PPI       Subjective:   Continues to feel nauseous with mild abdominal pain. One episode of non-bloody, non-bilious emesis during interview and exam.     Medications:     Current Facility-Administered Medications   Medication Dose Route Frequency   . amLODIPine  10 mg Oral Daily   . enoxaparin  40 mg Subcutaneous Daily   . erythromycin  250 mg Intravenous Q8H  SCH   . [COMPLETED] HYDROmorphone  1 mg Intravenous Once   . [COMPLETED] HYDROmorphone  1 mg Intravenous Once   . lisinopril  40 mg Oral Daily   . metoclopramide  10 mg Intravenous TID   . metoprolol  25 mg Oral Q12H   . [COMPLETED] ondansetron  4 mg Intravenous Once   . pantoprazole  40 mg Intravenous Daily   . [COMPLETED] promethazine  12.5 mg Intramuscular Once   . [COMPLETED] promethazine  12.5 mg Intravenous Once   . [COMPLETED] sodium chloride  2,000 mL Intravenous Once   . sucralfate  1 g Oral Q6H SCH       Review of Systems:   A comprehensive review of systems was: Negative except Headaches, abdominal pain, nausea and vomiting as per HPI    Physical Exam:     Filed Vitals:    02/16/13 0956   BP: 139/80   Pulse: 99   Temp:    Resp: 16   SpO2: 97%       Intake and Output Summary (Last 24 hours) at Date Time  No intake or output data in the 24 hours ending 02/16/13 1003    General  appearance - Tired, oriented x3, ill-appearing, NAD  Eyes - pupils equal and reactive, extraocular eye movements intact  Mouth - mucous membranes moist, pharynx normal without lesions  Chest - clear to auscultation, no wheezes, rales or rhonchi, symmetric air entry  Heart - Tachycardic, regular rhythm, no murmurs, rubs or gallops  Abdomen - mildly tender to palpation in epigastric area, soft, nondistended   Extremities - peripheral pulses normal, no pedal edema, no clubbing or cyanosis    Labs:     Results     Procedure Component Value Units Date/Time    Urinalysis with microscopic [[id number]]  (Abnormal) Collected:02/16/13 0438    Specimen Information:Urine Updated:02/16/13 0549     Urine Type Clean Catch      Color, UA Yellow      Clarity, UA Clear      Specific Gravity UA 1.025      Urine pH 6.0      Leukocyte Esterase, UA Negative      Nitrite, UA Negative      Protein, UR 30 (A)      Glucose, UA >1000 (A)      Ketones UA 10      Urobilinogen, UA Normal mg/dL      Bilirubin, UA Negative      Blood, UA Trace (A)      RBC, UA 0 - 5       WBC, UA 0 - 5      Squamous Epithelial Cells, Urine 0 - 5     Basic Metabolic Panel [[id number]]  (Abnormal) Collected:02/16/13 0438    Specimen Information:Blood Updated:02/16/13 0514     Glucose 310 (H) mg/dL      BUN 69.6 mg/dL      Creatinine 1.2 mg/dL      CALCIUM 9.5 mg/dL      Sodium 295 mEq/L      Potassium 4.0 mEq/L      Chloride 102 mEq/L      CO2 23 mEq/L     GFR [[id number]] Collected:02/16/13 0438     EGFR >60.0 Updated:02/16/13 0514    Lipase [[id number]] Collected:02/16/13 0438    Specimen Information:Blood Updated:02/16/13 0514     Lipase 49 U/L     CBC and differential [[id number]]  (Abnormal) Collected:02/16/13 0438    Specimen Information:Blood / Blood Updated:02/16/13 0507     WBC 8.10 x10 3/uL      RBC 4.81 x10 6/uL      Hgb 12.9 (L) g/dL      Hematocrit 10.2 (L) %      MCV 81.9 fL      MCH 26.8 (L) pg      MCHC 32.7 g/dL      RDW 13 %      Platelets 323 x10 3/uL      MPV 10.1 fL      Neutrophils 82 %      Lymphocytes Automated 13 %      Monocytes 4 %      Eosinophils Automated 0 %      Basophils Automated 0 %      Immature Granulocyte 0 %      Nucleated RBC 0      Neutrophils Absolute 6.67 x10 3/uL      Abs Lymph Automated 1.04 x10 3/uL      Abs Mono Automated 0.31 x10 3/uL      Abs Eos Automated 0.03 x10 3/uL      Absolute Baso Automated 0.03 x10 3/uL  Absolute Immature Granulocyte 0.02 x10 3/uL             Rads:   Radiological Procedure reviewed.    Signed by: Donnelly Stager, MD PGY-1  [address], [address]   [place], [place] [address]   Office Phone: [phone]   [facility] Resident Spectralink: [phone]   Einar Gip Resident Spectralink: [phone]

## 2013-02-16 NOTE — ED Notes (Signed)
Unsuccessful iv attempt x2. im phenergan given.

## 2013-02-16 NOTE — ED Notes (Signed)
P/w n/v and abd pain onset yesterday AM. PMH: gastroparesis. Pt actively vomiting in triage + sweating + chills. Denies fever and diarrhea. Pt denies being out of the country within the last 21 days or had contact with someone who has been out of the country within the last 21 days.

## 2013-02-16 NOTE — H&P (Signed)
ADMISSION HISTORY AND PHYSICAL EXAM    Date Time: 02/16/2013 7:40 AM  Patient Name: [name]  Attending Physician: Arlana Lindau, MD  Primary Care Physician: [name], MD  Team contact:  [phone]    CC: Nausea, vomiting      Assessment:   Principal Problem:   *Gastroparesis  Active Problems:   Gastritis without bleeding   Type 2 diabetes, uncontrolled, with neuropathy   Benign essential hypertension   Diabetic gastroparesis   Abdominal pain      38 yo male with Uncontrolled Diabetes and diabetic neuropathy, gastroparesis, HTN presents with nausea and vomiting likely from gastroparesis exacerbation     Plan:   # Nausea and vomiting 2/2 gastroparesis exacerbation    - Normal lipase, no gap  No evidence of leukocytosis UA with trace blood significant protein and glucose. Now S/p 2 L IVF in the ER, 1 mg dilaudid x2, phenergan 12.5 x 2 in the ER    - Check LFTs    - AXR to rule out obstruction    - mIVF 150 cc/hr    - Reglan 10 mg TID standing, Erythromycin 250 mg TID standing    - Phenergan IV/PO prn    - Continue pamelor 10 mg    - Discussed with patient sancuso ( long acting patch anti-emetic) can consider    - Miralax daily to help promote bowel movement    - Protonix and carafate 1 g QID    - GI consultation in AM    - Consider repeat scope last 07/2012    # History of QT prolongation    - Check EKG now    - telemetry monitoring     # Hypertension    - patient has not been on metoprolol at home as he was unable to fill so restarted clonidine 0.1 mg BID    - he likely has rebound hypertension from being on the clonidine and now vomiting    - Restart Lisinopril and amlodipine, start metoprolol as it was well tolerated on prior hospitalizations, Hydralazine PRN for SBP > 180, if remains tachycardic may need to consider increasing beta blocker     # Diabetes    - Hold home medications while NPO    - UA with significant glucose but minimal ketones and no anion gap    - Trace blood - may consider  repeat UA to evaluate    - Insulin SSI    - Restart lantus when patient able to tolerate PO ( sent home on 10 units given decreased appetite, prior dose was 33 units)    - Continue gabapentin for diabetic neuropathy with plan to taper as outpatient once nortriptyline reaches steady state     # FEN/GI NPO, NS  # PPX: Lovenox, GI with home PPI       Disposition:     Anticipated medical stability for discharge: January,  10 - Afternoon  Service status: Inpatient: risk of morbidity and mortality  Reason for ongoing hospitalization: Uncontrolled pain, monitoring of electrolytes   Anticipated discharge needs: TBD       History of Presenting Illness:   [name] is a 38 y.o. male who presents to the hospital with uncontrolled nausea and vomiting starting the day of admission. He was recently discharged from the hospital on 02/11/13. He was instructed to take phenergan and reglan at home. In addition, he was started on pamelor. He was unable to get pamelor at home. He was trying  in addition to the above combination extra ativan and benadryl he had at home. He denies recent narcotic use, denies marijuana or other drug use. He does endorse to having 5-6 beers the day of admission. Had some issues with constipation where he did not have a Bowel movement for 3 days after discharge now with soft stools. He endorses abdominal pain. Denies fevers, chills.    At home he has been trying to follow a diabetic diet. He has been only on 10 units of lantus daily until his appetite increased     Of note patient restarted clonidine at home because he was unable to fill the metoprolol. He has been taking clonidine in addition to lisinopril and amlodipine up until the day of admission     PCP: Alben Spittle   GI: Dr Jens Som     Past Medical History:     Past Medical History   Diagnosis Date   . Diabetes    . Hypertensive disorder    . Gastroparesis        Available old records reviewed, including:  EPIC     Past Surgical History:     Past  Surgical History   Procedure Date   . Cholecystectomy    . Knee arthroscopy w/ acl reconstruction      left   . Egd 08/08/2012     Procedure: EGD;  Surgeon: [name], MD;  Location: [facility];  Service: Gastroenterology;  Laterality: N/A;  w\bx       Family History:   None contributory     Social History:     History   Smoking status   . Never Smoker    Smokeless tobacco   . Never Used     History   Alcohol Use   . Yes     Comment: "occasional"     History   Drug Use No       Allergies:   No Known Allergies    Medications:     Current/Home Medications    AMLODIPINE (NORVASC) 5 MG TABLET    Take 2 tablets (10 mg total) by mouth daily.    GABAPENTIN (NEURONTIN) 300 MG CAPSULE    Take 300 mg by mouth nightly.    INSULIN GLARGINE (LANTUS) 100 UNIT/ML INJECTION    Inject 10 Units into the skin nightly. Check am fasting sugars. Increase by 1 unit every morning for readings >120. For max of 30 units    LISINOPRIL (PRINIVIL,ZESTRIL) 40 MG TABLET    Take 1 tablet (40 mg total) by mouth daily.    LORAZEPAM (ATIVAN) 0.5 MG TABLET    Take 1 tablet (0.5 mg total) by mouth every 6 (six) hours as needed. PRN cyclic vomiting    METOCLOPRAMIDE (REGLAN) 5 MG TABLET    Take 2 tablets (10 mg total) by mouth 3 (three) times daily.    METOPROLOL (LOPRESSOR) 25 MG TABLET    Take 1 tablet (25 mg total) by mouth every 12 (twelve) hours.- patient has not started     NORTRIPTYLINE (PAMELOR) 10 MG CAPSULE    Take 1 capsule (10 mg total) by mouth nightly.    PANTOPRAZOLE (PROTONIX) 40 MG TABLET    Take 40 mg by mouth daily.    POLYETHYLENE GLYCOL (MIRALAX) PACKET    Take 17 g by mouth 2 (two) times daily.    PROMETHAZINE (PHENERGAN) 25 MG TABLET    Take 1 tablet (25 mg total) by mouth every 6 (six) hours  as needed.    SUCRALFATE (CARAFATE) 1 G TABLET    Take 1 tablet (1 g total) by mouth 4 (four) times daily.    Clonidine 0.1 mg BID     Method by which medications were confirmed on admission: Patient     Review of Systems:    Review of Systems - History obtained from the patient  General ROS: negative for - chills, fever, sleep disturbance, weight gain or weight loss  ENT ROS: positive for - headaches,   Respiratory ROS: no cough, shortness of breath, or wheezing  Cardiovascular ROS: no chest pain or dyspnea on exertion  Gastrointestinal ROS: positive abdominal pain, change in bowel habits, or black or bloody stools  Genito-Urinary ROS: no dysuria  Musculoskeletal ROS: negative for - joint pain, joint swelling or muscle pain    Physical Exam:     Patient Vitals for the past 24 hrs:   BP Temp Pulse Resp SpO2   02/16/13 0737 187/99 mmHg 97.8 F (36.6 C) 108  16  99 %   02/16/13 0630 164/87 mmHg - - - 98 %   02/16/13 0530 180/84 mmHg - - - 98 %   02/16/13 0300 192/113 mmHg - - - 100 %   02/16/13 0148 203/115 mmHg - 119  - -   02/16/13 0141 242/126 mmHg 97.5 F (36.4 C) 131  18  99 %     There is no height or weight on file to calculate BMI.  No intake or output data in the 24 hours ending 02/16/13 0740    General: awake, alert, oriented x 3; mod distress no cardiopulmonary distress  HEENT: perrla, eomi, sclera anicteric  oropharynx clear without lesions, mucous membranes moist  Neck: supple, no lymphadenopathy, no thyromegaly, no JVD, no carotid bruits  Cardiovascular: Hypertensive now 160s/80s, tachy low 100s regular rate and rhythm, no murmurs, rubs or gallops  Lungs: clear to auscultation bilaterally, without wheezing, rhonchi, or rales  Abdomen: soft, tender, non-distended; no palpable masses, no hepatosplenomegaly, normoactive bowel sounds, no rebound or guarding  Extremities: no clubbing, cyanosis, or edema  Neuro: cranial nerves grossly intact, strength 5/5 in upper and lower extremities, sensation intact,       Labs:     Results     Procedure Component Value Units Date/Time    Urinalysis with microscopic [[id number]]  (Abnormal) Collected:02/16/13 0438    Specimen Information:Urine Updated:02/16/13 0549     Urine Type Clean Catch       Color, UA Yellow      Clarity, UA Clear      Specific Gravity UA 1.025      Urine pH 6.0      Leukocyte Esterase, UA Negative      Nitrite, UA Negative      Protein, UR 30 (A)      Glucose, UA >1000 (A)      Ketones UA 10      Urobilinogen, UA Normal mg/dL      Bilirubin, UA Negative      Blood, UA Trace (A)      RBC, UA 0 - 5      WBC, UA 0 - 5      Squamous Epithelial Cells, Urine 0 - 5     Basic Metabolic Panel [[id number]]  (Abnormal) Collected:02/16/13 0438    Specimen Information:Blood Updated:02/16/13 0514     Glucose 310 (H) mg/dL      BUN 95.2 mg/dL      Creatinine 1.2 mg/dL  CALCIUM 9.5 mg/dL      Sodium 161 mEq/L      Potassium 4.0 mEq/L      Chloride 102 mEq/L      CO2 23 mEq/L     GFR [[id number]] Collected:02/16/13 0438     EGFR >60.0 Updated:02/16/13 0514    Lipase [[id number]] Collected:02/16/13 0438    Specimen Information:Blood Updated:02/16/13 0514     Lipase 49 U/L     CBC and differential [[id number]]  (Abnormal) Collected:02/16/13 0438    Specimen Information:Blood / Blood Updated:02/16/13 0507     WBC 8.10 x10 3/uL      RBC 4.81 x10 6/uL      Hgb 12.9 (L) g/dL      Hematocrit 95.6 (L) %      MCV 81.9 fL      MCH 26.8 (L) pg      MCHC 32.7 g/dL      RDW 13 %      Platelets 323 x10 3/uL      MPV 10.1 fL      Neutrophils 82 %      Lymphocytes Automated 13 %      Monocytes 4 %      Eosinophils Automated 0 %      Basophils Automated 0 %      Immature Granulocyte 0 %      Nucleated RBC 0      Neutrophils Absolute 6.67 x10 3/uL      Abs Lymph Automated 1.04 x10 3/uL      Abs Mono Automated 0.31 x10 3/uL      Abs Eos Automated 0.03 x10 3/uL      Absolute Baso Automated 0.03 x10 3/uL      Absolute Immature Granulocyte 0.02 x10 3/uL         No results found.    Case Discussed with Dr. Omar Person     Signed by: Kristine Linea, MD   [facility] [address]   [place], [place] [address]  [phone]   UX:LKGMWN, Vania Rea, MD

## 2013-02-16 NOTE — Progress Notes (Signed)
[facility] Practice Attending Progress Note         Assessment:  Patient Active Problem List   Diagnosis   . GERD (gastroesophageal reflux disease)   . Gastritis without bleeding   . Type 2 diabetes, uncontrolled, with neuropathy   . Nausea & vomiting   . Benign essential hypertension   . Diabetic gastroparesis   . Abdominal pain   . Gastroparesis    Multiple admissions for recurrent exacerbation of gastroparesis.  May have been exacerbated by alcohol ingestion and not taking appropriate outpatient medications    Plan:  Agree with plan as written by Dr. Oswaldo Done  Will not consult GI today - will follow recs from last hospitalization last week and reconsult if not improving    S:  Still with nausea and vomiting  Pain adequately controlled        Current Facility-Administered Medications   Medication Dose Route Frequency   . amLODIPine  10 mg Oral Daily   . enoxaparin  40 mg Subcutaneous Daily   . erythromycin  250 mg Intravenous Q8H SCH   . [COMPLETED] HYDROmorphone  1 mg Intravenous Once   . [COMPLETED] HYDROmorphone  1 mg Intravenous Once   . lisinopril  40 mg Oral Daily   . metoclopramide  10 mg Intravenous TID   . metoprolol  25 mg Oral Q12H   . nortriptyline  10 mg Oral QHS   . [COMPLETED] ondansetron  4 mg Intravenous Once   . pantoprazole  40 mg Intravenous Daily   . polyethylene glycol  17 g Oral BID   . [COMPLETED] promethazine  12.5 mg Intramuscular Once   . [COMPLETED] promethazine  12.5 mg Intravenous Once   . [COMPLETED] sodium chloride  2,000 mL Intravenous Once   . sucralfate  1 g Oral Q6H SCH        O:  Filed Vitals:    02/16/13 1104   BP: 158/89   Pulse: 99   Temp: 97.2 F (36.2 C)   Resp: 16   SpO2: 96%      No intake or output data in the 24 hours ending 02/16/13 1143   NAD  Cor RRR  Lungs clear to auscultation  Abdomen soft, nontender  Ext without edema    Lenox Ahr, MD  [facility]  [phone]

## 2013-02-16 NOTE — ED Provider Notes (Signed)
Physician/Midlevel provider first contact with patient: 02/16/13 0257           EMERGENCY DEPARTMENT HISTORY AND PHYSICAL EXAM    Date: 02/16/2013  Patient Name: [name]  Attending Physician: Arlana Lindau, MD      Disposition and Treatment Plan    Clinical Impression:   1. Gastroparesis    2. Intractable vomiting      Disposition:   ED Disposition     Admit Admitting Physician: Doyle Askew [[id number]]  Diagnosis: Gastroparesis [536.3]  Estimated Length of Stay: > or = to 2 midnights  Tentative Discharge Plan?: Home or Self Care [1]  Patient Class: Inpatient [101]  I certify that inpatient services are medically necessary for this patient. Please see H&P and MD progress notes for additional information about the patient's course of treatment. For Medicare patients, services provided in accordance with 412.3 and expected LOS to be greater than 2 midnights for Medicare patients.: Yes                 History of Presenting Illness     Chief Complaint:   Chief Complaint   Patient presents with   . Emesis   . Abdominal Pain     [name] is a 38 y.o. male who  has a past medical history of Diabetes; Hypertensive disorder; and Gastroparesis. c/o persistent vomiting for 4-5 hours.  Pt was hospitalized last week for similar sxs.  Denies fever or diarrhea.     This history was obtained from pt.     PCP: [name] Devoid, MD      Past Medical History     Past Medical History   Diagnosis Date   . Diabetes    . Hypertensive disorder    . Gastroparesis        Past Surgical History   Procedure Date   . Cholecystectomy    . Knee arthroscopy w/ acl reconstruction      left   . Egd 08/08/2012     Procedure: EGD;  Surgeon: [name], MD;  Location: [facility];  Service: Gastroenterology;  Laterality: N/A;  w\bx       Family History     Family History   Problem Relation Age of Onset   . Diabetes Father    . Diabetes Maternal Grandmother    . Diabetes Paternal Grandmother        Social History      History     Social History   . Marital Status: Single     Spouse Name: N/A     Number of Children: N/A   . Years of Education: N/A     Occupational History   . Not on file.     Social History Main Topics   . Smoking status: Never Smoker    . Smokeless tobacco: Never Used   . Alcohol Use: Yes      Comment: "occasional"   . Drug Use: No   . Sexually Active: Not on file     Other Topics Concern   . Not on file     Social History Narrative   . No narrative on file        Allergies     No Known Allergies    Medications     Current facility-administered medications:[COMPLETED] HYDROmorphone (DILAUDID) injection 1 mg, 1 mg, Intravenous, Once, Arlana Lindau, MD, 1 mg at 02/16/13 0446;  HYDROmorphone (DILAUDID) injection 1 mg, 1 mg, Intravenous, Once,  Arlana Lindau, MD;  [name] ondansetron [facility]) injection 4 mg, 4 mg, Intravenous, Once, Arlana Lindau, MD, 4 mg at 02/16/13 0441  [COMPLETED] promethazine (PHENERGAN) injection 12.5 mg, 12.5 mg, Intramuscular, Once, Arlana Lindau, MD, 12.5 mg at 02/16/13 0314;  promethazine (PHENERGAN) injection 12.5 mg, 12.5 mg, Intravenous, Once, Arlana Lindau, MD;  sodium chloride 0.9 % bolus 2,000 mL, 2,000 mL, Intravenous, Once, Arlana Lindau, MD  Current outpatient prescriptions:amLODIPine (NORVASC) 5 MG tablet, Take 2 tablets (10 mg total) by mouth daily., Disp: 30 tablet, Rfl: 0;  gabapentin (NEURONTIN) 300 MG capsule, Take 300 mg by mouth nightly., Disp: , Rfl: ;  insulin glargine (LANTUS) 100 UNIT/ML injection, Inject 10 Units into the skin nightly. Check am fasting sugars. Increase by 1 unit every morning for readings >120. For max of 30 units, Disp: 3 mL, Rfl: 0  lisinopril (PRINIVIL,ZESTRIL) 40 MG tablet, Take 1 tablet (40 mg total) by mouth daily., Disp: 30 tablet, Rfl: 0;  LORazepam (ATIVAN) 0.5 MG tablet, Take 1 tablet (0.5 mg total) by mouth every 6 (six) hours as needed. PRN cyclic vomiting, Disp: 30 tablet, Rfl: 0;  metoclopramide (REGLAN) 5 MG  tablet, Take 2 tablets (10 mg total) by mouth 3 (three) times daily., Disp: , Rfl: 0  metoprolol (LOPRESSOR) 25 MG tablet, Take 1 tablet (25 mg total) by mouth every 12 (twelve) hours., Disp: 30 tablet, Rfl: 0;  nortriptyline (PAMELOR) 10 MG capsule, Take 1 capsule (10 mg total) by mouth nightly., Disp: 30 capsule, Rfl: 0;  pantoprazole (PROTONIX) 40 MG tablet, Take 40 mg by mouth daily., Disp: , Rfl: ;  polyethylene glycol (MIRALAX) packet, Take 17 g by mouth 2 (two) times daily., Disp: 14 each, Rfl: 0  promethazine (PHENERGAN) 25 MG tablet, Take 1 tablet (25 mg total) by mouth every 6 (six) hours as needed., Disp: 30 tablet, Rfl: 0;  sucralfate (CARAFATE) 1 G tablet, Take 1 tablet (1 g total) by mouth 4 (four) times daily., Disp: 90 tablet, Rfl: 0    Review of Systems     Constitutional: Negative.  Negative for fever, chills and appetite change.   Eyes: Negative.     Respiratory: Negative.  Negative for cough and shortness of breath.    Cardiovascular: Negative.  Negative for chest pain.   Gastrointestinal:  Negative for abdominal pain. Vomiting  Genitourinary: Negative.  Negative for difficulty urinating.   Neurological: Negative.  Negative for headaches.   All Other Systems Reviewed and Negative: Yes    Physical Exam     Physical Exam   Nursing note and vitals reviewed.  Constitutional:Pt is oriented to person, place, and time. Pt appears well-developed and well-nourished. No distress.   HEENT:   Head: Normocephalic and atraumatic.   Right Ear: External ear normal.   Left Ear: External ear normal.   Nose: Nose normal.   Mouth/Throat: Oropharynx is clear and moist. No oropharyngeal exudate. Poor dentition.  Eyes: Conjunctivae and EOM are normal. Pupils are equal, round, and reactive to light. Right eye exhibits no discharge. Left eye exhibits no discharge. No scleral icterus.   Neck: Normal range of motion. Neck supple. No JVD present. No tracheal deviation present. No thyromegaly present.   Cardiovascular:  Normal rate, regular rhythm, normal heart sounds and intact distal pulses.  Exam reveals no gallop and no friction rub.    No murmur heard.  Pulmonary/Chest: Effort normal and breath sounds normal. No stridor. No respiratory distress. Pt has no wheezes. Pt has no rales. Pt exhibits no tenderness.  Abdominal: Soft. Bowel sounds are normal. Pt exhibits no distension and no mass. There is no tenderness. There is no rebound and no guarding.  Vomiting  Lymphadenopathy:   Pt has no cervical adenopathy.   Neurological: Pt displays normal reflexes. No cranial nerve deficit. Pt exhibits normal muscle tone. Coordination normal.   Skin: Skin is warm and dry. Pt is not diaphoretic.   Psychiatric: Pt has a normal mood and affect. Behavior is normal. Judgment and thought content normal      Diagnostic Study Results     Labs -     Results     Procedure Component Value Units Date/Time    CBC and differential [[id number]] Collected:02/16/13 0438    Specimen Information:Blood / Blood Updated:02/16/13 0445    Basic Metabolic Panel [[id number]] Collected:02/16/13 0438    Specimen Information:Blood Updated:02/16/13 0445    Lipase [[id number]] Collected:02/16/13 0438    Specimen Information:Blood Updated:02/16/13 0445          Radiologic Studies -   Radiology Results (24 Hour)     ** No Results found for the last 24 hours. **      .    Clinical Course in the Emergency Department     4:57 AM - D/w [facility], will admit    Medical Decision Making   I am the first provider for this patient.  I reviewed the vital signs, nursing notes, past medical history, past surgical history, family history and social history.  I have reviewed the patient's previous charts.    Pulse ox reviewed by me. All ordered labs and images reviewed by me.     Diagnosis and Treatment Plan       _______________________________      I was acting as a scribe for Arlana Lindau, MD on [employer]  Treatment Team: Scribe: April Manson   I am the  first provider for this patient and I personally performed the services documented. Treatment Team: Scribe: April Manson is scribing for me on [employer]Kasyn L. This note accurately reflects work and decisions made by me.  Arlana Lindau, MD    _______________________________      Arlana Lindau, MD  02/16/13 [phone]

## 2013-02-17 LAB — CBC AND DIFFERENTIAL
Basophils Absolute Automated: 0.03 10*3/uL (ref 0.00–0.20)
Basophils Automated: 0 %
Eosinophils Absolute Automated: 0.14 10*3/uL (ref 0.00–0.70)
Eosinophils Automated: 2 %
Hematocrit: 38.8 % — ABNORMAL LOW (ref 42.0–52.0)
Hgb: 12.4 g/dL — ABNORMAL LOW (ref 13.0–17.0)
Immature Granulocytes Absolute: 0.03 10*3/uL
Immature Granulocytes: 0 %
Lymphocytes Absolute Automated: 2.57 10*3/uL (ref 0.50–4.40)
Lymphocytes Automated: 34 %
MCH: 26.7 pg — ABNORMAL LOW (ref 28.0–32.0)
MCHC: 32 g/dL (ref 32.0–36.0)
MCV: 83.4 fL (ref 80.0–100.0)
MPV: 9.9 fL (ref 9.4–12.3)
Monocytes Absolute Automated: 0.5 10*3/uL (ref 0.00–1.20)
Monocytes: 7 %
Neutrophils Absolute: 4.33 10*3/uL (ref 1.80–8.10)
Neutrophils: 57 %
Nucleated RBC: 0 (ref 0–1)
Platelets: 298 10*3/uL (ref 140–400)
RBC: 4.65 10*6/uL — ABNORMAL LOW (ref 4.70–6.00)
RDW: 13 % (ref 12–15)
WBC: 7.6 10*3/uL (ref 3.50–10.80)

## 2013-02-17 LAB — BASIC METABOLIC PANEL
BUN: 7 mg/dL — ABNORMAL LOW (ref 9.0–21.0)
CO2: 27 mEq/L (ref 22–29)
Calcium: 8.9 mg/dL (ref 8.5–10.5)
Chloride: 104 mEq/L (ref 98–107)
Creatinine: 1 mg/dL (ref 0.7–1.3)
Glucose: 164 mg/dL — ABNORMAL HIGH (ref 70–100)
Potassium: 4.3 mEq/L (ref 3.5–5.1)
Sodium: 139 mEq/L (ref 136–145)

## 2013-02-17 LAB — URINALYSIS, REFLEX TO MICROSCOPIC EXAM IF INDICATED
Bilirubin, UA: NEGATIVE
Blood, UA: NEGATIVE
Ketones UA: NEGATIVE
Leukocyte Esterase, UA: NEGATIVE
Nitrite, UA: NEGATIVE
Protein, UR: NEGATIVE
Specific Gravity UA: 1.013 (ref 1.001–1.035)
Urine pH: 6 (ref 5.0–8.0)
Urobilinogen, UA: NORMAL mg/dL

## 2013-02-17 LAB — GFR: EGFR: >60

## 2013-02-17 LAB — GLUCOSE WHOLE BLOOD - POCT
Whole Blood Glucose POCT: 164 mg/dL — ABNORMAL HIGH (ref 70–100)
Whole Blood Glucose POCT: 221 mg/dL — ABNORMAL HIGH (ref 70–100)
Whole Blood Glucose POCT: 221 mg/dL — ABNORMAL HIGH (ref 70–100)

## 2013-02-17 LAB — MAGNESIUM: Magnesium: 1.6 mg/dL (ref 1.6–2.6)

## 2013-02-17 MED ORDER — HYDROMORPHONE HCL PF 1 MG/ML IJ SOLN
0.5000 mg | Freq: Once | INTRAMUSCULAR | Status: AC
Start: 2013-02-18 — End: 2013-02-18
  Administered 2013-02-18: 0.5 mg via INTRAVENOUS
  Filled 2013-02-17: qty 1

## 2013-02-17 MED ORDER — LORAZEPAM 2 MG/ML IJ SOLN
1.0000 mg | Freq: Three times a day (TID) | INTRAMUSCULAR | Status: DC | PRN
Start: 2013-02-17 — End: 2013-02-19
  Administered 2013-02-17 – 2013-02-18 (×4): 1 mg via INTRAVENOUS
  Filled 2013-02-17 (×4): qty 1

## 2013-02-17 MED ORDER — ONDANSETRON HCL 4 MG/2ML IJ SOLN
4.0000 mg | Freq: Four times a day (QID) | INTRAMUSCULAR | Status: DC | PRN
Start: 2013-02-17 — End: 2013-02-19
  Administered 2013-02-17 – 2013-02-18 (×3): 4 mg via INTRAVENOUS
  Filled 2013-02-17 (×3): qty 2

## 2013-02-17 NOTE — Plan of Care (Signed)
Problem: Pain  Goal: Patient's pain/discomfort is manageable  AXOX4. VSS . SR. Vomited with bile emesis. Phenergan 12.5 mg IV was  given ,  also abd pain controlled with Dilaudid 1 mg IV. NPO, IVF with NS 150 cc/h.

## 2013-02-17 NOTE — Progress Notes (Signed)
[facility] Practice Attending Progress Note         Assessment:  Patient Active Problem List   Diagnosis   . GERD (gastroesophageal reflux disease)   . Gastritis without bleeding   . Type 2 diabetes, uncontrolled, with neuropathy   . Nausea & vomiting   . Benign essential hypertension   . Diabetic gastroparesis   . Abdominal pain   . Gastroparesis        Plan:  Trial of sips of clear liquids today  Continue other medications  Agree with plan as per Dr. Jacqualin Combes note    S:  Vomited once overnight.  Feels a little better today.  Less nausea and wants to try a little po        Current Facility-Administered Medications   Medication Dose Route Frequency   . amLODIPine  10 mg Oral Daily   . enoxaparin  40 mg Subcutaneous Daily   . erythromycin  250 mg Intravenous Q8H SCH   . lisinopril  40 mg Oral Daily   . metoclopramide  10 mg Intravenous TID   . metoprolol  25 mg Oral Q12H   . nortriptyline  10 mg Oral QHS   . pantoprazole  40 mg Intravenous Daily   . polyethylene glycol  17 g Oral BID   . sucralfate  1 g Oral Q6H SCH        O:  Filed Vitals:    02/17/13 1242   BP: 161/91   Pulse: 93   Temp: 97.9 F (36.6 C)   Resp: 17   SpO2: 95%        Intake/Output Summary (Last 24 hours) at 02/17/13 1248  Last data filed at 02/17/13 0700   Gross per 24 hour   Intake   1600 ml   Output      0 ml   Net   1600 ml        NAD  Cor RRR  Lungs clear  Abdomen benign    Lenox Ahr, MD  [facility]  [phone]

## 2013-02-17 NOTE — Progress Notes (Signed)
Family Medicine Progress Note    Date Time: 02/17/2013 9:43 AM  Patient Name: [name], [name]  Team Contact Info: Spectra #[id number] 8 am -5 pm: After hours [phone]      Assessment:   38yo M with uncontrolled diabetes and diabetic neuropathy, gastroparesis, HTN presents with nausea and vomiting, likely from gastroparesis exacerbation.     Plan:   Nausea and vomiting, 2/2 gastroparesis exacerbation   - XR negative for obstruction   - mIVF 150 cc/hr   - Reglan 10 mg TID scheduled  - Erythromycin 250 mg TID   - Phenergan IV/PO prn   - Continue Pamelor 10 mg   - Consider sancuso (long acting patch anti-emetic), to be used as an outpatient   - Miralax 17g BID to help promote bowel movement   - Protonix and carafate 1 g QID   - d/c Dilaudid with improved pain and adverse hypomotility effect, will add Ativan PRN   - Will consult GI if not improving on previously recommended regimen     History of QT prolongation   - Check EKG now   - telemetry monitoring     Hypertension, was not taking home metoprolol and likely rebound from restarting clonidine  - Lisinopril 40mg  daily   - Amlodipine 10mg  daily   - Metoprolol 25mg  BID  - Hydralazine PRN for SBP >180     Diabetes   - Hold home medications while PO intake is minimal  - UA on admission with significant glucose but minimal ketones and no anion gap   - UA on admission with trace blood   - Insulin SSI   - Restart lantus when patient able to tolerate PO (sent home from recent hospitalization on 10 units given decreased appetite, prior dose was 33 units)   - Continue gabapentin for diabetic neuropathy with plan to taper as outpatient once nortriptyline reaches steady state     FEN/GI: Clear liquid diet as tolerated, IVF  PPX: Lovenox, GI with home PPI       Subjective:   Feels improved today, no longer has headaches. Only endorses having pain just following vomiting episodes, improved from yesterday. One episode of vomiting overnight. Took BP medications this morning and had  some discomfort with consuming water at that time, but no further episodes of emesis.     Medications:     Current Facility-Administered Medications   Medication Dose Route Frequency   . amLODIPine  10 mg Oral Daily   . enoxaparin  40 mg Subcutaneous Daily   . erythromycin  250 mg Intravenous Q8H SCH   . lisinopril  40 mg Oral Daily   . metoclopramide  10 mg Intravenous TID   . metoprolol  25 mg Oral Q12H   . nortriptyline  10 mg Oral QHS   . pantoprazole  40 mg Intravenous Daily   . polyethylene glycol  17 g Oral BID   . sucralfate  1 g Oral Q6H SCH       Review of Systems:   A comprehensive review of systems was: Negative except abdominal pain, nausea and vomiting as per HPI    Physical Exam:     Filed Vitals:    02/17/13 0819   BP: 187/105   Pulse: 84   Temp: 97.3 F (36.3 C)   Resp: 16   SpO2: 95%       Intake and Output Summary (Last 24 hours) at Date Time    Intake/Output Summary (Last 24 hours) at 02/17/13 1610  Last data filed at 02/17/13 0700   Gross per 24 hour   Intake   1600 ml   Output      0 ml   Net   1600 ml       General appearance - Tired, alert and oriented x3, NAD  Eyes - pupils equal and reactive, extraocular eye movements intact  Mouth - mucous membranes moist, pharynx normal without lesions  Chest - clear to auscultation, no wheezes, rales or rhonchi, symmetric air entry  Heart - regular rate, regular rhythm, no murmurs, rubs or gallops  Abdomen - mildly tender to palpation in epigastric area, soft, nondistended   Extremities - peripheral pulses normal, no pedal edema, no clubbing or cyanosis    Labs:     Results     Procedure Component Value Units Date/Time    UA, Reflex to Microscopic [[id number]] Collected:02/17/13 0916    Specimen Information:Urine Updated:02/17/13 0916    Basic Metabolic Panel [[id number]]  (Abnormal) Collected:02/17/13 0354    Specimen Information:Blood Updated:02/17/13 0509     Glucose 164 (H) mg/dL      BUN 7.0 (L) mg/dL      Creatinine 1.0 mg/dL      CALCIUM 8.9 mg/dL       Sodium 782 mEq/L      Potassium 4.3 mEq/L      Chloride 104 mEq/L      CO2 27 mEq/L     Magnesium [[id number]] Collected:02/17/13 0354    Specimen Information:Blood Updated:02/17/13 0509     Magnesium 1.6 mg/dL     GFR [[id number]] BMWUXLKGM:02/13/70 0354     EGFR >60.0 Updated:02/17/13 0509    CBC with differential [[id number]]  (Abnormal) Collected:02/17/13 0354    Specimen Information:Blood / Blood Updated:02/17/13 0440     WBC 7.60 x10 3/uL      RBC 4.65 (L) x10 6/uL      Hgb 12.4 (L) g/dL      Hematocrit 53.6 (L) %      MCV 83.4 fL      MCH 26.7 (L) pg      MCHC 32.0 g/dL      RDW 13 %      Platelets 298 x10 3/uL      MPV 9.9 fL      Neutrophils 57 %      Lymphocytes Automated 34 %      Monocytes 7 %      Eosinophils Automated 2 %      Basophils Automated 0 %      Immature Granulocyte 0 %      Nucleated RBC 0      Neutrophils Absolute 4.33 x10 3/uL      Abs Lymph Automated 2.57 x10 3/uL      Abs Mono Automated 0.50 x10 3/uL      Abs Eos Automated 0.14 x10 3/uL      Absolute Baso Automated 0.03 x10 3/uL      Absolute Immature Granulocyte 0.03 x10 3/uL     Glucose Whole Blood - POCT [[id number]]  (Abnormal) Collected:02/16/13 2311     POCT - Glucose Whole blood 159 (H) mg/dL VZDGLOV:56/43/32 9518    Hepatic function panel (LFT) [[id number]] Collected:02/16/13 1720    Specimen Information:Blood Updated:02/16/13 1757     Bilirubin, Total 0.6 mg/dL      Bilirubin, Direct 0.3 mg/dL      Bilirubin, Indirect 0.3 mg/dL      AST (SGOT) 13 U/L      ALT 19 U/L  Alkaline Phosphatase 76 U/L      Protein, Total 6.8 g/dL      Albumin 3.8 g/dL      Globulin 3.0 g/dL      Albumin/Globulin Ratio 1.3     Glucose Whole Blood - POCT [[id number]]  (Abnormal) Collected:02/16/13 1630     POCT - Glucose Whole blood 149 (H) mg/dL NFAOZHY:86/57/84 6962    Glucose Whole Blood - POCT [[id number]]  (Abnormal) Collected:02/16/13 1119     POCT - Glucose Whole blood 196 (H) mg/dL MWNUUVO:53/66/44 0347            Rads:   Radiological Procedure  reviewed.    Signed by: Donnelly Stager, MD PGY-1  [address], [address]   [place], [place] [address]   Office Phone: [phone]   [facility] Resident Spectralink: [phone]   Einar Gip Resident Spectralink: [phone]

## 2013-02-17 NOTE — Progress Notes (Signed)
Pt a&ox4, MAE, c/o n/v this shift, prn medication given with moderate effect. Pt complained of pain, prn dilaudid given. MD d/c'd dilaudid to promote bowel movement, prn ativan administered with good effect. Pt given hydralazine x1 for BP>180. Will continue to monitor.

## 2013-02-18 LAB — GLUCOSE WHOLE BLOOD - POCT
Whole Blood Glucose POCT: 203 mg/dL — ABNORMAL HIGH (ref 70–100)
Whole Blood Glucose POCT: 196 mg/dL — ABNORMAL HIGH (ref 70–100)
Whole Blood Glucose POCT: 161 mg/dL — ABNORMAL HIGH (ref 70–100)
Whole Blood Glucose POCT: 184 mg/dL — ABNORMAL HIGH (ref 70–100)

## 2013-02-18 LAB — ECG 12-LEAD
Atrial Rate: 120 {beats}/min
P Axis: 68 degrees
P-R Interval: 162 ms
Q-T Interval: 316 ms
QRS Duration: 74 ms
QTC Calculation (Bezet): 446 ms
R Axis: 55 degrees
T Axis: 40 degrees
Ventricular Rate: 120 {beats}/min

## 2013-02-18 MED ORDER — HYDROMORPHONE HCL PF 1 MG/ML IJ SOLN
1.0000 mg | Freq: Once | INTRAMUSCULAR | Status: AC
Start: 2013-02-19 — End: 2013-02-19
  Administered 2013-02-19: 1 mg via INTRAVENOUS
  Filled 2013-02-18: qty 1

## 2013-02-18 MED ORDER — HYDROMORPHONE HCL PF 1 MG/ML IJ SOLN
1.0000 mg | Freq: Once | INTRAMUSCULAR | Status: AC
Start: 2013-02-18 — End: 2013-02-18
  Administered 2013-02-18: 1 mg via INTRAVENOUS
  Filled 2013-02-18: qty 1

## 2013-02-18 MED ORDER — METOPROLOL TARTRATE 50 MG PO TABS
100.0000 mg | ORAL_TABLET | Freq: Two times a day (BID) | ORAL | Status: DC
Start: 2013-02-18 — End: 2013-02-20
  Administered 2013-02-19 – 2013-02-20 (×4): 100 mg via ORAL
  Filled 2013-02-18 (×4): qty 2

## 2013-02-18 MED ORDER — NORTRIPTYLINE HCL 25 MG PO CAPS
50.0000 mg | ORAL_CAPSULE | Freq: Every evening | ORAL | Status: DC
Start: 2013-02-18 — End: 2013-02-20
  Administered 2013-02-19 (×2): 50 mg via ORAL
  Filled 2013-02-18 (×4): qty 2

## 2013-02-18 NOTE — Consults (Signed)
[facility]  CONSULTATION NOTE    Date Time: 02/18/2013 5:55 PM  Patient Name: [name]  Requesting Physician: Elby Showers, MD       Reason for Consultation:   Gastroparesis exacerbation    Assessment and Plan:   Assessment/Plan:  Pt is a 38 y/o male with PMH uncontrolled HTN, DM complicated by diabetic neuropathy who presented with N/V, likely from gastroparesis exacerbation ( 4 inpatient admissions in the last month)    1. Nausea and vomiting - gastroparesis exacerbation likely exacerbated by narcotic bowel.  Recommend weening off narcotics as it can worsen gastroparesis and exacerbate symptoms of nausea vomiting and pain. Extensive discussion regarding PEG placement for nutrition and for venting purposes took place; at this time, pt does not want "a hole in his stomach" and reports that he is not interested in weaning off narcotics at this time as they help temporarily.     ---At this time no additional medical options for recurrent gastroparesis ( multifactorial, iatrogenic as well as underlying poorly controlled DM2). Continue with Reglan ( with known risk of tardive dyskinesia, and parkinsonian effects), erythromycin, zosyn and phenergan, and nortriptyline ( caution with QT-prolongation)    ---Alternatively, during his last admission, pt was advised to see motility specialist at [facility] (Dr. Burton Apley) or at [facility] in [place]; should also be considered after discharge.    ---Please re-consult GI when pt is weened off narcotics for further conversation regarding PEG placement.     2. Other medical management and supportive measures per primary team.      History:   [name] is a 38 y.o. male who presents to the hospital on 02/16/2013 with nausea and vomiting. Reports that dilaudid is the only thing that helps his pain and is consistent with gastroparesis. Denies hematemesis. Pt not interested in PEG tube at this time.    Past Medical History:     Past  Medical History   Diagnosis Date   . Diabetes    . Hypertensive disorder    . Gastroparesis        Past Surgical History:     Past Surgical History   Procedure Date   . Cholecystectomy    . Knee arthroscopy w/ acl reconstruction      left   . Egd 08/08/2012     Procedure: EGD;  Surgeon: [name], MD;  Location[facility];  Service: Gastroenterology;  Laterality: N/A;  w\bx       Family History:     Family History   Problem Relation Age of Onset   . Diabetes Father    . Diabetes Maternal Grandmother    . Diabetes Paternal Grandmother        Social History:     History     Social History   . Marital Status: Single     Spouse Name: N/A     Number of Children: N/A   . Years of Education: N/A     Social History Main Topics   . Smoking status: Never Smoker    . Smokeless tobacco: Never Used   . Alcohol Use: Yes      Comment: "occasional"   . Drug Use: No   . Sexually Active: Not on file     Other Topics Concern   . Not on file     Social History Narrative   . No narrative on file       Allergies:   No Known  Allergies    Medications:     Current Facility-Administered Medications   Medication Dose Route Frequency   . amLODIPine  10 mg Oral Daily   . enoxaparin  40 mg Subcutaneous Daily   . erythromycin  250 mg Intravenous Q8H SCH   . [COMPLETED] HYDROmorphone  0.5 mg Intravenous Once   . [COMPLETED] HYDROmorphone  1 mg Intravenous Once   . lisinopril  40 mg Oral Daily   . metoclopramide  10 mg Intravenous TID   . metoprolol  100 mg Oral Q12H   . nortriptyline  50 mg Oral QHS   . pantoprazole  40 mg Intravenous Daily   . polyethylene glycol  17 g Oral BID   . sucralfate  1 g Oral Q6H SCH   . [DISCONTINUED] metoprolol  25 mg Oral Q12H   . [DISCONTINUED] nortriptyline  10 mg Oral QHS       Review of Systems:   Complete 12 Point ROS negative or normal except those mentioned in HPI above.    Physical Exam:     Filed Vitals:    02/18/13 1622   BP: 157/84   Pulse: 108   Temp: 98.4 F (36.9 C)   Resp: 16    SpO2: 96%     General appearance: Well developed, well nourished, appears stated age and in NAD. Resting comfortably.  Eyes: Sclera anicteric, pink conjunctivae, no ptosis  ENMT: mucous membranes moist, nose and ears appear normal.  Oropharynx clear.  Chest: Non labored respirations, no audible wheezing, no clubbing or cyanosis  CV:  Regular rate and rhythm, no JVD, no LE edema  Abdomen: soft, non-tender, non-distended, no masses or organomegaly  Skin: Normal color and turgor, no rashes, no suspicious skin lesions noted  Neuro: CN II-XII grossly intact.  No gross movement disorders noted.  Mental status: Appropriate affect, alert and oriented x 3    Labs Reviewed:     Recent Labs   [facility] 02/17/13 0354 02/16/13 0438    WBC 7.60 8.10    HGB 12.4* 12.9*    HCT 38.8* 39.4*    PLT 298 323    MCV 83.4 81.9       Recent Labs   Basename 02/17/13 0354 02/16/13 0438    NA 139 138    K 4.3 4.0    CL 104 102    CO2 27 23    BUN 7.0* 11.0    CREAT 1.0 1.2    GLU 164* 310*    CA 8.9 9.5    MG 1.6 --    PHOS -- --       Recent Labs   Basename 02/16/13 1720    AST 13    ALT 19    ALKPHOS 76    BILITOTAL 0.6    BILIDIRECT 0.3    PROT 6.8    ALB 3.8       No results found for this basename: PTT:2,PT:2,INR:2 in the last 72 hours     Radiology:   Radiological Procedure reviewed:    AXR 02/16/13:  No free air or small bowel obstruction. Improved constipation.

## 2013-02-18 NOTE — Progress Notes (Signed)
[facility] Attending Note    This patient was reviewed on rounds with Dr. Neale Burly.  Physical exam confirmed and duplicated.  I agree with assessment and plan as outlined.    Still with significant epigastric pain, nausea and recurrent vomiting  BP not controlled  Wants dilaudid - says that helps his nausea and vomiting as well as pain  No improvement of symptoms since admission  Will consult GI again  Increase Pamelor  Increase beta blocker  I'm hesitant to give dilaudid frequently as it can worsen gastroparesis  OK to use sparingly    Patient Active Problem List   Diagnosis   . GERD (gastroesophageal reflux disease)   . Gastritis without bleeding   . Type 2 diabetes, uncontrolled, with neuropathy   . Nausea & vomiting   . Benign essential hypertension   . Diabetic gastroparesis   . Abdominal pain   . Gastroparesis     Lenox Ahr, MD  [facility]  [phone]

## 2013-02-18 NOTE — Progress Notes (Signed)
Family Medicine Progress Note    Date Time: 02/18/2013 7:13 AM  Patient Name: [name], [name]  Team Contact Info: Spectra #[id number] 8 am -5 pm: After hours [phone]      Assessment:   38yo M with uncontrolled diabetes and diabetic neuropathy, gastroparesis, HTN presents with nausea and vomiting, likely from gastroparesis exacerbation.     Plan:   1). Nausea and vomiting, 2/2 gastroparesis exacerbation   - mIVF 150 cc/hr   - Reglan 10 mg TID scheduled  - Erythromycin 250 mg TID   - Phenergan IV/PO prn   - Continue Pamelor 50 mg qhs  - Miralax 17g BID to help promote bowel movement   - Protonix and carafate 1 g QID   - d/c Dilaudid with improved pain and adverse hypomotility effect, will add Ativan PRN   - GI consult, appreciate recommendations    2). History of QT prolongation   - telemetry monitoring     3). Hypertension  - Lisinopril 40mg  daily   - Amlodipine 10mg  daily   - Metoprolol 100mg  BID  - Hydralazine PRN for SBP >180     4). Diabetes   - Insulin SSI       FEN/GI: Clear liquid diet as tolerated, IVF  PPX: Lovenox, GI with home PPI       Subjective:   Has increased abdominal pain today and wants dilaudid.      Medications:     Current Facility-Administered Medications   Medication Dose Route Frequency   . amLODIPine  10 mg Oral Daily   . enoxaparin  40 mg Subcutaneous Daily   . erythromycin  250 mg Intravenous Q8H SCH   . [COMPLETED] HYDROmorphone  0.5 mg Intravenous Once   . lisinopril  40 mg Oral Daily   . metoclopramide  10 mg Intravenous TID   . metoprolol  25 mg Oral Q12H   . nortriptyline  10 mg Oral QHS   . pantoprazole  40 mg Intravenous Daily   . polyethylene glycol  17 g Oral BID   . sucralfate  1 g Oral Q6H SCH       Physical Exam:     Filed Vitals:    02/18/13 0507   BP: 189/106   Pulse: 124   Temp: 99.5 F (37.5 C)   Resp: 17   SpO2: 95%       Intake and Output Summary (Last 24 hours) at Date Time  No intake or output data in the 24 hours ending 02/18/13 0713    General appearance - Complains  of abdominal pain.    Eyes - pupils equal and reactive, extraocular eye movements intact  Mouth - mucous membranes moist, pharynx normal without lesions  Chest - clear to auscultation, no wheezes, rales or rhonchi, symmetric air entry  Heart - regular rate, regular rhythm, no murmurs, rubs or gallops  Abdomen - mildly tender to palpation in epigastric area, soft, nondistended   Extremities - peripheral pulses normal, no pedal edema, no clubbing or cyanosis    Labs:     Results     Procedure Component Value Units Date/Time    Glucose Whole Blood - POCT [[id number]]  (Abnormal) Collected:02/17/13 2148     POCT - Glucose Whole blood 221 (H) mg/dL WUJWJXB:14/78/29 5621    Glucose Whole Blood - POCT [[id number]]  (Abnormal) Collected:02/17/13 1622     POCT - Glucose Whole blood 221 (H) mg/dL NGEXBMW:41/32/44 0102    Glucose Whole Blood - POCT [[id number]]  (Abnormal)  Collected:02/17/13 0733     POCT - Glucose Whole blood 164 (H) mg/dL VWUJWJX:91/47/82 9562    UA, Reflex to Microscopic [[id number]]  (Abnormal) Collected:02/17/13 0913    Specimen Information:Urine Updated:02/17/13 1023     Urine Type Clean Catch      Color, UA Yellow      Clarity, UA Clear      Specific Gravity UA 1.013      Urine pH 6.0      Leukocyte Esterase, UA Negative      Nitrite, UA Negative      Protein, UR Negative      Glucose, UA Trace (A)      Ketones UA Negative      Urobilinogen, UA Normal mg/dL      Bilirubin, UA Negative      Blood, UA Negative      RBC, UA 0 - 5      WBC, UA 0 - 5             Rads:   Radiological Procedure reviewed.    Signed by: Erenest Rasher, MD   [address]   [place], [place] [address]   Office Phone: [phone]   [facility] Resident Spectralink: [phone]   Einar Gip Resident Spectralink: [phone]

## 2013-02-18 NOTE — Progress Notes (Addendum)
Alert and oriented X 4.  On room air, denies any sob or distress.  ST on tele HR of 120.  Vomited X 3, received phengan and Zofran IV.  Complained of abdominal pain medicated with ativan IV which didn't help and dr. Neale Burly notified and patient received one time dost of 1 mg IV dilaudid with good outcome. BP of 205/104 and patient refused scheduled PO antihypertensive, instead IV hydralazine given ( repeat BP 1547/84).  Plan is to monitor and assess pain, safety and continue with plan of care.

## 2013-02-18 NOTE — Progress Notes (Signed)
AXOX3, still having N/V? abd pain. ,  Refused all PO meds, said" can't tolerated . MD called . Dilaudid 0.5 mg given , Started Zofran IV.PRN. Continue to monitor

## 2013-02-18 NOTE — Progress Notes (Signed)
Pt has complained of nausea with vomiting and/or dry heaving throughout the night. Pt's blood pressure continues to be elevated. Pt has had limited relief from Ativan administration for pain, nausea, and high blood pressure but not sustained. Had multiple conversations with Dr. [place]. Tran overnight regarding pt condition. MD approved a 1 time dose of Dilaudid now, but med team concern is that pt's gastroparesis will worsen with sustained opioid administration. Pt had limited nausea relief from Phenergan and Reglan also; Zofran just administered. Will provide pain med and overdue Metoprolol and Pamelor. Will continue to monitor.

## 2013-02-19 LAB — BASIC METABOLIC PANEL
BUN: 8 mg/dL — ABNORMAL LOW (ref 9.0–21.0)
CO2: 26 mEq/L (ref 22–29)
Calcium: 8.9 mg/dL (ref 8.5–10.5)
Chloride: 102 mEq/L (ref 98–107)
Creatinine: 1 mg/dL (ref 0.7–1.3)
Glucose: 194 mg/dL — ABNORMAL HIGH (ref 70–100)
Potassium: 4.1 mEq/L (ref 3.5–5.1)
Sodium: 136 mEq/L (ref 136–145)

## 2013-02-19 LAB — CBC AND DIFFERENTIAL
Basophils Absolute Automated: 0.02 10*3/uL (ref 0.00–0.20)
Basophils Automated: 0 %
Eosinophils Absolute Automated: 0.06 10*3/uL (ref 0.00–0.70)
Eosinophils Automated: 1 %
Hematocrit: 39.4 % — ABNORMAL LOW (ref 42.0–52.0)
Hgb: 12.8 g/dL — ABNORMAL LOW (ref 13.0–17.0)
Immature Granulocytes Absolute: 0.03 10*3/uL
Immature Granulocytes: 0 %
Lymphocytes Absolute Automated: 2.52 10*3/uL (ref 0.50–4.40)
Lymphocytes Automated: 34 %
MCH: 26.8 pg — ABNORMAL LOW (ref 28.0–32.0)
MCHC: 32.5 g/dL (ref 32.0–36.0)
MCV: 82.4 fL (ref 80.0–100.0)
MPV: 9.4 fL (ref 9.4–12.3)
Monocytes Absolute Automated: 0.61 10*3/uL (ref 0.00–1.20)
Monocytes: 8 %
Neutrophils Absolute: 4.18 10*3/uL (ref 1.80–8.10)
Neutrophils: 56 %
Nucleated RBC: 0 (ref 0–1)
Platelets: 270 10*3/uL (ref 140–400)
RBC: 4.78 10*6/uL (ref 4.70–6.00)
RDW: 13 % (ref 12–15)
WBC: 7.42 10*3/uL (ref 3.50–10.80)

## 2013-02-19 LAB — GLUCOSE WHOLE BLOOD - POCT
Whole Blood Glucose POCT: 161 mg/dL — ABNORMAL HIGH (ref 70–100)
Whole Blood Glucose POCT: 241 mg/dL — ABNORMAL HIGH (ref 70–100)

## 2013-02-19 LAB — GFR: EGFR: >60

## 2013-02-19 LAB — MAGNESIUM: Magnesium: 1.7 mg/dL (ref 1.6–2.6)

## 2013-02-19 MED ORDER — PROMETHAZINE HCL 25 MG/ML IJ SOLN
12.5000 mg | Freq: Four times a day (QID) | INTRAMUSCULAR | Status: DC
Start: 2013-02-19 — End: 2013-02-20
  Administered 2013-02-19: 12.5 mg via INTRAVENOUS
  Filled 2013-02-19: qty 1

## 2013-02-19 MED ORDER — LORAZEPAM 2 MG/ML IJ SOLN
1.0000 mg | Freq: Three times a day (TID) | INTRAMUSCULAR | Status: DC
Start: 2013-02-19 — End: 2013-02-20
  Administered 2013-02-20: 1 mg via INTRAVENOUS
  Filled 2013-02-19 (×2): qty 1

## 2013-02-19 MED ORDER — ONDANSETRON HCL 4 MG/2ML IJ SOLN
4.0000 mg | Freq: Four times a day (QID) | INTRAMUSCULAR | Status: DC
Start: 2013-02-19 — End: 2013-02-20
  Administered 2013-02-19 – 2013-02-20 (×3): 4 mg via INTRAVENOUS
  Filled 2013-02-19 (×3): qty 2

## 2013-02-19 MED ORDER — KETOROLAC TROMETHAMINE 30 MG/ML IJ SOLN
15.0000 mg | Freq: Four times a day (QID) | INTRAMUSCULAR | Status: DC | PRN
Start: 2013-02-19 — End: 2013-02-20

## 2013-02-19 NOTE — Progress Notes (Signed)
Family Medicine Progress Note    Date Time: 02/19/2013 7:52 AM  Patient Name: [name], [name]  Team Contact Info: Spectra #[id number] 8 am -5 pm: After hours [phone]      Assessment:   38yo M with uncontrolled diabetes and diabetic neuropathy, gastroparesis, HTN presents with nausea and vomiting, likely from gastroparesis exacerbation.     Patient Active Problem List   Diagnosis   . GERD (gastroesophageal reflux disease)   . Gastritis without bleeding   . Type 2 diabetes, uncontrolled, with neuropathy   . Nausea & vomiting   . Benign essential hypertension   . Diabetic gastroparesis   . Abdominal pain   . Gastroparesis         Plan:   1). Nausea and vomiting, 2/2 gastroparesis exacerbation   - mIVF 150 cc/hr   - Reglan 10 mg TID scheduled  -Schedule Ativan 1 mg q8hrs  - Erythromycin 250 mg TID   - Phenergan IV/PO prn   - Continue Pamelor 50 mg qhs  -Consider [place] patch as outpatient  - Miralax 17g BID to help promote bowel movement   - Protonix and carafate 1 g QID   - Will attempt to wean dilaudid today.  Give po pain medications today if needed.    - GI consulted, appreciate recommendations    2). History of QT prolongation   - telemetry monitoring     3). Hypertension  - Lisinopril 40mg  daily   - Amlodipine 10mg  daily   - Metoprolol 100mg  BID  - Hydralazine PRN for SBP >180     4). Diabetes   - Insulin SSI       FEN/GI: Clear liquid diet as tolerated, IVF  PPX: Lovenox, GI with home PPI       Subjective:   Had abdominal pain and related HTN overnight.  Responded to ativan however pain persisted.  Required dose of dilaudid overnight with good effect.      Medications:     Current Facility-Administered Medications   Medication Dose Route Frequency   . amLODIPine  10 mg Oral Daily   . enoxaparin  40 mg Subcutaneous Daily   . erythromycin  250 mg Intravenous Q8H SCH   . [COMPLETED] HYDROmorphone  1 mg Intravenous Once   . [COMPLETED] HYDROmorphone  1 mg Intravenous Once   . lisinopril  40 mg Oral Daily   .  metoclopramide  10 mg Intravenous TID   . metoprolol  100 mg Oral Q12H   . nortriptyline  50 mg Oral QHS   . pantoprazole  40 mg Intravenous Daily   . polyethylene glycol  17 g Oral BID   . sucralfate  1 g Oral Q6H SCH   . [DISCONTINUED] metoprolol  25 mg Oral Q12H   . [DISCONTINUED] nortriptyline  10 mg Oral QHS       Physical Exam:     Filed Vitals:    02/19/13 0457   BP: 179/86   Pulse: 86   Temp: 97.2 F (36.2 C)   Resp: 18   SpO2: 96%       Intake and Output Summary (Last 24 hours) at Date Time  No intake or output data in the 24 hours ending 02/19/13 0752    General appearance - Complains of abdominal pain.    Eyes - pupils equal and reactive, extraocular eye movements intact  Mouth - mucous membranes moist, pharynx normal without lesions  Chest - clear to auscultation, no wheezes, rales or rhonchi, symmetric air  entry  Heart - regular rate, regular rhythm, no murmurs, rubs or gallops  Abdomen - mildly tender to palpation in epigastric area, soft, nondistended   Extremities - peripheral pulses normal, no pedal edema, no clubbing or cyanosis    Labs:     Results     Procedure Component Value Units Date/Time    Basic Metabolic Panel [[id number]]  (Abnormal) Collected:02/19/13 0652    Specimen Information:Blood Updated:02/19/13 0732     Glucose 194 (H) mg/dL      BUN 8.0 (L) mg/dL      Creatinine 1.0 mg/dL      CALCIUM 8.9 mg/dL      Sodium 643 mEq/L      Potassium 4.1 mEq/L      Chloride 102 mEq/L      CO2 26 mEq/L     Magnesium [[id number]] Collected:02/19/13 0652    Specimen Information:Blood Updated:02/19/13 0732     Magnesium 1.7 mg/dL     GFR [[id number]] FUXNATFTD:32/20/25 0652     EGFR >60.0 Updated:02/19/13 0732    CBC with differential [[id number]]  (Abnormal) Collected:02/19/13 0652    Specimen Information:Blood / Blood Updated:02/19/13 0717     WBC 7.42 x10 3/uL      RBC 4.78 x10 6/uL      Hgb 12.8 (L) g/dL      Hematocrit 28.3 (L) %      MCV 82.4 fL      MCH 26.8 (L) pg      MCHC 32.5 g/dL      RDW 13 %       Platelets 270 x10 3/uL      MPV 9.4 fL      Neutrophils 56 %      Lymphocytes Automated 34 %      Monocytes 8 %      Eosinophils Automated 1 %      Basophils Automated 0 %      Immature Granulocyte 0 %      Nucleated RBC 0      Neutrophils Absolute 4.18 x10 3/uL      Abs Lymph Automated 2.52 x10 3/uL      Abs Mono Automated 0.61 x10 3/uL      Abs Eos Automated 0.06 x10 3/uL      Absolute Baso Automated 0.02 x10 3/uL      Absolute Immature Granulocyte 0.03 x10 3/uL     Glucose Whole Blood - POCT [[id number]]  (Abnormal) Collected:02/18/13 2149     POCT - Glucose Whole blood 184 (H) mg/dL PXTGGYI:94/85/46 2703    Glucose Whole Blood - POCT [[id number]]  (Abnormal) Collected:02/18/13 1747     POCT - Glucose Whole blood 161 (H) mg/dL XHBZJIR:67/89/38 1017    Glucose Whole Blood - POCT [[id number]]  (Abnormal) Collected:02/18/13 1143     POCT - Glucose Whole blood 196 (H) mg/dL POEUMPN:36/14/43 1540    Glucose Whole Blood - POCT [[id number]]  (Abnormal) Collected:02/18/13 0821     POCT - Glucose Whole blood 203 (H) mg/dL DTOIZTI:45/80/99 8338            Rads:   Radiological Procedure reviewed.    Signed by: Erenest Rasher, MD [address]   [address], [address]   [place], [place] [address]   Office Phone: [phone]   [facility] Resident Spectralink: [phone]   Einar Gip Resident Spectralink: [phone]

## 2013-02-19 NOTE — Procedures (Signed)
PROCEDURE: MIDLINE INSERTION    Date of Insertion: 02/19/2013    INDICATIONS: IV Access       PROCEDURE DETAILS:     The midline procedure, risks, benefits were discussed with the Patient/Family.  All questions were answered. verbalized understanding and agreed to proceed. Midline education/instructions provided.    The patient was positioned and Ultrasound was used to confirm patency of the vein prior to obtaining venous access. The arm was scrubbed with 2% chlorhexidine per guidelines and a maximal sterile field was established for the patient.  The clinician was attired with cap, mask and sterile gown/gloves prior to start.     A sterile cover was sheathed to the Ultrasound probe. The vein was then revisualized and 1% lidocaine injected prior to puncture  with a 21-gauge single-wall needle under direct sonographic guidance. The guidewire was advanced through the needle, the needle was removed and a peel-away sheath was placed over the wire.   Midline then was trimmed to insure tip location at or below the level of axillar. The catheter was then advanced through the peel-away sheath. The sheath was removed. The catheter was flushed with normal saline to confirm brisk blood return and capped. The catheter was stabilized on the skin using a securement device. Antimicrobial disk and sterile transparent occlusive dressing applied using aseptic technique.     Patient did tolerate the procedure well.     Catheter Type: 4 Fr non - power Poly Midline  Insertion Site: RT Brachial vein  Total length: 18 cm  Internal Length:  18cm  External Length: 0cm  UAC: 45cm    Midline Reference #: [id number]  Midline Kit Lot#: [id number]  Midline Kit expiration date: 2016    Findings/Conclusions:  No signs of bleeding or symptoms of nerve irritation noted at time of insertion procedure.  Tip location is at the level of the axilla.     Midline is ready for immediate use.    Renee Harder, RN. CRNI

## 2013-02-19 NOTE — Progress Notes (Signed)
[facility] Attending Note    This patient was evaluated on rounds with Dr. Neale Burly.  Physical exam directly observed and duplicated.  I agree with assessment and plan as outlined.    Appreciate GI recs  Agree he would benefit from being off opiates, but he is insistent that they are the only thing that improves his pain, nausea and vomiting  Again reviewed the physiologic effect of opiates on the GI tract with him  Needs new iv access  Will schedule antiemetics and ativan, rather than prn and see if that helps      Patient Active Problem List   Diagnosis   . GERD (gastroesophageal reflux disease)   . Gastritis without bleeding   . Type 2 diabetes, uncontrolled, with neuropathy   . Nausea & vomiting   . Benign essential hypertension   . Diabetic gastroparesis   . Abdominal pain   . Gastroparesis     Lenox Ahr, MD  [facility]  [phone]

## 2013-02-19 NOTE — Progress Notes (Signed)
Pt remained stable throughout the day no distress tolerated clears well  No n/v ivf at 150 held ativan per pt request axox3 does not want peg tube possible [place] in am.

## 2013-02-20 LAB — GLUCOSE WHOLE BLOOD - POCT
Whole Blood Glucose POCT: 161 mg/dL — ABNORMAL HIGH (ref 70–100)
Whole Blood Glucose POCT: 217 mg/dL — ABNORMAL HIGH (ref 70–100)

## 2013-02-20 MED ORDER — NORTRIPTYLINE HCL 50 MG PO CAPS
50.0000 mg | ORAL_CAPSULE | Freq: Every evening | ORAL | Status: AC
Start: 2013-02-20 — End: 2013-03-22

## 2013-02-20 MED ORDER — PROMETHAZINE HCL 12.5 MG PO TABS
12.5000 mg | ORAL_TABLET | Freq: Four times a day (QID) | ORAL | Status: DC
Start: 2013-02-20 — End: 2013-02-28

## 2013-02-20 MED ORDER — ONDANSETRON HCL 4 MG PO TABS
4.0000 mg | ORAL_TABLET | Freq: Four times a day (QID) | ORAL | Status: DC
Start: 2013-02-20 — End: 2013-02-20
  Administered 2013-02-20 (×2): 4 mg via ORAL
  Filled 2013-02-20 (×2): qty 1

## 2013-02-20 MED ORDER — ONDANSETRON HCL 4 MG PO TABS
4.0000 mg | ORAL_TABLET | Freq: Four times a day (QID) | ORAL | Status: DC
Start: 2013-02-20 — End: 2013-05-01

## 2013-02-20 MED ORDER — LORAZEPAM 1 MG PO TABS
1.0000 mg | ORAL_TABLET | Freq: Four times a day (QID) | ORAL | Status: DC | PRN
Start: 2013-02-20 — End: 2013-02-28

## 2013-02-20 MED ORDER — PROMETHAZINE HCL 25 MG PO TABS
12.5000 mg | ORAL_TABLET | Freq: Four times a day (QID) | ORAL | Status: DC
Start: 2013-02-20 — End: 2013-02-20
  Administered 2013-02-20 (×2): 12.5 mg via ORAL
  Filled 2013-02-20 (×2): qty 1

## 2013-02-20 MED ORDER — LORAZEPAM 2 MG/ML IJ SOLN
1.0000 mg | Freq: Three times a day (TID) | INTRAMUSCULAR | Status: DC
Start: 2013-02-20 — End: 2013-02-20
  Administered 2013-02-20: 1 mg via INTRAVENOUS
  Filled 2013-02-20: qty 1

## 2013-02-20 NOTE — Progress Notes (Signed)
Patient discharged at 1833, prescriptions explained and given to the patient. Accompanied by family members.  IV, midline discontinued per md order.  Advised to follow up with pcp and GI, verbalized understanding of it. Personal belongings taken with the patient.

## 2013-02-20 NOTE — Discharge Instructions (Signed)
Gastroparesis  Gastroparesis (also called delayed gastric emptying) occurs when the stomach takes longer than normal to empty of food. This is due to a problem with motility (the movement of the muscles in the digestive tract). For many people, gastroparesis is a lifelong condition. But treatment can help relieve symptoms and prevent complications. Read on to learn more about gastroparesis and how it can be managed.  How Gastroparesis Develops     Gastroparesis means that food and fluids move too slowly out of the stomach into the duodenum.   With normal motility, signals from nerves tell the stomach muscles when to contract. These muscles move food from the stomach into the duodenum (the first part of the small bowel). With gastroparesis, the nerves or muscles are damaged. This causes motility to slow down or stop completely. As a result, food cannot move from the stomach properly. This delayed emptying can cause nausea, vomiting, and other symptoms. Malnutrition can result. Bezoars (hardened lumps of food) can form in the stomach and cause other complications as well.  Causes of Gastroparesis  Gastroparesis can be caused by any of the following:   Diabetes   Surgery involving any of the digestive organs, such as the stomach and bowels   Certain medications, such as strong pain medications (narcotics)   Certain conditions, such as systemic scleroderma, Parkinson's disease, and thyroid disease  In many cases, the cause of gastroparesis cannot be found.  Signs and Symptoms of Gastroparesis  These can include:   Nausea and vomiting   Feeling full quickly when eating   Abdominal pain   Heartburn   Abdominal bloating   Weight loss   Loss of appetite   High and low blood sugar levels (in people withdiabetes)  Diagnosing Gastroparesis  Your doctor will ask about your symptoms and health history. You'll also be examined. In addition, blood tests and X-rays are often done to check your health and rule out  other problems. To confirm the problem, you may need other tests as well. These can include:   Upper endoscopy.This is doneto see inside the stomach and duodenum. For the test, an endoscope is used. This is a thin, flexible tube with a tiny camera on the end. It's inserted through the mouth and down into the stomach and duodenum.   Upper gastrointestinal (GI) series.This is doneto take X-rays of the upper GI tract from the mouth to the small bowel. For the test, a substance called barium is used. The barium coats the upper GI tract so that it will show up clearly on X-rays.   Gastric emptying scan.This is done to measure how quickly food leaves the stomach. For the test, a meal containing a harmless radioactive substance (tracer) is eaten. Then scans of the stomach are done. The tracer shows up clearly on the scans and shows the movement of the food through the stomach.  Treating Gastroparesis  The goal of treatment is to help you manage your condition. Treatment may include one or more of the following:   Dietary changes.You may need to make changes to your eating habits and daily diet. For instance, your doctor may instruct you to eat small meals throughout the day. Doing this can keep you from feeling full too quickly. You may be placed on a liquid or "soft" diet. This means you'll eat liquid foods or foods that are mashed or put through a blender. In addition, you may need to avoid foods high in fats and fiber. These can slow digestion.   For more help with your diet, your doctor can refer you to a dietitian. In severe cases, you may need a feeding tube. This sends liquid food or medication directly to your small bowel, bypassing the stomach.   Medications.These can help manage symptoms, such as nausea and vomiting. They can also improve motility. Each medication has specific risks and side effects. Your doctor can tell you more about any medication that is prescribed for you.   Surgery.You may need  to have a tube surgically inserted into the stomach. The tube removes excess air and fluid. This can relieve severe symptoms of nausea and vomiting. In rare cases, other surgery may be needed on the stomach or small bowel. This is to create a new passageway for food to be emptied from the stomach.   Other treatments.These include botulin toxin injection and gastric electrical stimulation. They are done less often and may not be available. Your doctor can tell you more about these treatments if they are options for you.  Diabetes and Gastroparesis  If you have diabetes, gastroparesis can make it harder to manage your blood sugar level. You'll need to take extra steps in your treatment to prevent complications. Work with your doctor to learn what you can do to protect your health. For more information, contact the American Diabetes Association,www.diabetes.org.   Long-term Concerns  With treatment, most people can manage their symptoms and maintain their usual routines. If your symptoms are moderate to severe, you may need to see your doctor more frequently for checkups. Also, other treatments will likely be needed.   2000-2014 [employer], [address], [place] [address]. All rights reserved. This information is not intended as a substitute for professional medical care. Always follow your healthcare professional's instructions.

## 2013-02-20 NOTE — Progress Notes (Signed)
Pt alert and oriented x4. vss afebrile. Pt denied sob/doe/n/v.  Pt up oob no complaints. Pt wanting to increase diet bc no further pain and eager to go home. Pt cooperative and pleasant. Continue to support plan of care and maintain patient safety.

## 2013-02-20 NOTE — Progress Notes (Addendum)
MEDICINE PROGRESS NOTE    Date Time: 02/20/2013 9:30 AM  Patient Name: [name]  Attending Physician: Elby Showers, MD      Assessment:     Patient Active Problem List    Diagnosis Date Noted   . Gastroparesis 02/16/2013   . Abdominal pain 02/09/2013   . Diabetic gastroparesis 08/22/2012   . Benign essential hypertension 08/05/2012   . Type 2 diabetes, uncontrolled, with neuropathy 06/27/2012   . Nausea & vomiting 06/27/2012   . GERD (gastroesophageal reflux disease) 06/17/2012   . Gastritis without bleeding 06/17/2012         [name] with uncontrolled diabetes and diabetic neuropathy, gastroparesis, HTN presents with nausea and vomiting, likely from gastroparesis exacerbation.       Plan:   #Nausea and vomiting, 2/2 gastroparesis exacerbation   - mIVF 150 cc/hr   - Reglan 10 mg TID scheduled   - Ativan 1 mg q8hrs scheduled   - Erythromycin 250 mg TID   - Phenergan IV/PO prn   - Continue Pamelor 50 mg qhs   - Miralax 17g BID to help promote bowel movement   - Protonix and carafate QID   - has not required any opioid medications in the past 24 hours; toradol prn  - GI following    #History of QT prolongation   - telemetry monitoring     #Hypertension   - Lisinopril 40mg  daily   - Amlodipine 10mg  daily   - Metoprolol 100mg  BID   - Hydralazine PRN for SBP >180     #Diabetes   - Insulin SSI   - accuchecks    #FEN/GI/PPx: low fat diet, IVF, Lovenox, home PPI     #Code: FULL    #Dispo: Anticipate d/c home today if tolerates advancement in diet        CC: gastroparesis exacerbation    Subjective: Feeling well today.  Has not required any opioid medications in the past 24 hours.  About to start low fat diet this morning.  Desires to go home if tolerates.    Physical Exam:     Filed Vitals:    02/20/13 0744   BP: 124/67   Pulse: 76   Temp: 96.8 F (36 C)   Resp: 18   SpO2: 97%     Body mass index is 35.00 kg/(m^2).  No intake or output data in the 24 hours ending 02/20/13 0930    General appearance - NAD  Mouth  - mucous membranes moist  Chest - clear to auscultation, no wheezes, rales or rhonchi  Heart - regular rate, regular rhythm, no murmurs, rubs or gallops  Abdomen - soft, non-tender, hypoactive bowel sounds  Extremities - peripheral pulses normal, no pedal edema      Meds:   Medications were reviewed:  Current Facility-Administered Medications   Medication Dose Route Frequency   . amLODIPine  10 mg Oral Daily   . enoxaparin  40 mg Subcutaneous Daily   . erythromycin  250 mg Intravenous Q8H SCH   . lisinopril  40 mg Oral Daily   . LORazepam  1 mg Intravenous Q8H SCH   . metoclopramide  10 mg Intravenous TID   . metoprolol  100 mg Oral Q12H   . nortriptyline  50 mg Oral QHS   . ondansetron  4 mg Intravenous Q6H   . pantoprazole  40 mg Intravenous Daily   . polyethylene glycol  17 g Oral BID   . promethazine  12.5 mg  Intravenous Q6H SCH   . sucralfate  1 g Oral Q6H SCH       PRN meds:  dextrose, dextrose, glucagon (rDNA), hydrALAZINE, insulin aspart, ketorolac, naloxone, [DISCONTINUED] LORazepam, [DISCONTINUED] ondansetron, [DISCONTINUED] promethazine, [DISCONTINUED] promethazine    Continuous infusions:       . sodium chloride 150 mL/hr at 02/20/13 0325       Labs:     Recent Labs   Basename 02/19/13 0652    WBC 7.42    HGB 12.8*    HCT 39.4*    PLT 270    MCV 82.4       Recent Labs   Basename 02/19/13 0652    NA 136    K 4.1    CL 102    CO2 26    BUN 8.0*    CREAT 1.0    GLU 194*    CA 8.9    MG 1.7    PHOS --       No results found for this basename: AST:2,ALT:2,ALKPHOS:2,PROT:2,ALB:2 in the last 72 hours    No results found for this basename: PTT:2,PT:2,INR:2 in the last 72 hours    Imaging personally reviewed:  Radiology Results (24 Hour)     ** No Results found for the last 24 hours. **                Signed by: Markus Jarvis, MD  PGY-3 [facility]

## 2013-02-20 NOTE — Discharge Summary (Signed)
DISCHARGE NOTE    Date Time: 02/20/2013 4:32 PM  Patient Name: [name]  Attending Physician: Elby Showers, MD    Date of Admission:   02/16/2013    Date of Discharge:   02/20/13    Reason for Admission:   Gastroparesis [536.3]  Intractable vomiting [536.2]  Gastroparesis    Problems:   Lists the present on admission hospital problems  Present on Admission:   . Gastroparesis  . Abdominal pain  . Benign essential hypertension  . Diabetic gastroparesis  . Gastritis without bleeding  . Type 2 diabetes, uncontrolled, with neuropathy  . GERD (gastroesophageal reflux disease)    Hospital Problems:  Principal Problem:   *Gastroparesis  Active Problems:   GERD (gastroesophageal reflux disease)   Gastritis without bleeding   Type 2 diabetes, uncontrolled, with neuropathy   Benign essential hypertension   Diabetic gastroparesis   Abdominal pain      Problem Lists:  Patient Active Problem List   Diagnosis   . GERD (gastroesophageal reflux disease)   . Gastritis without bleeding   . Type 2 diabetes, uncontrolled, with neuropathy   . Nausea & vomiting   . Benign essential hypertension   . Diabetic gastroparesis   . Abdominal pain   . Gastroparesis       Discharge Dx:     Patient Active Problem List   Diagnosis   . GERD (gastroesophageal reflux disease)   . Gastritis without bleeding   . Type 2 diabetes, uncontrolled, with neuropathy   . Nausea & vomiting   . Benign essential hypertension   . Diabetic gastroparesis   . Abdominal pain   . Gastroparesis         Consultations:   GI GANV    Procedures performed:   None     Hospital Course:   HPI Per Dr. Army Melia: [name] is a 38 y.o. male who presents to the hospital with uncontrolled nausea and vomiting starting the day of admission. He was recently discharged from the hospital on 02/11/13. He was instructed to take phenergan and reglan at home. In addition, he was started on pamelor. He was unable to get pamelor at home. He was trying in addition to the above  combination extra ativan and benadryl he had at home. He denies recent narcotic use, denies marijuana or other drug use. He does endorse to having 5-6 beers the day of admission. Had some issues with constipation where he did not have a Bowel movement for 3 days after discharge now with soft stools. He endorses abdominal pain. Denies fevers, chills.   At home he has been trying to follow a diabetic diet. He has been only on 10 units of lantus daily until his appetite increased   Of note patient restarted clonidine at home because he was unable to fill the metoprolol. He has been taking clonidine in addition to lisinopril and amlodipine up until the day of admission     Hospital Course:  The patient was admitted and made NPO.  IV dilaudid was started as well as prn zofran and phenergan.  The patient had several episodes of daily vomiting, poorly controlled with medications.  IV dilaudid was the only thing that could help patient with pain.  Tried IV ativan on day 3 prn with good results.  Decision was made to schedule IV zofran, ativan, and phenergan on hospital day 4.  At day of discharge the patient was pain free, able to tolerate diet.  The patient  was discharged on home meds with instructions to follow up with [facility], PCP, and GI MD.       Discharge Medications:     Current Discharge Medication List      START taking these medications    Details   ondansetron (ZOFRAN) 4 MG tablet Take 1 tablet (4 mg total) by mouth every 6 (six) hours.  Qty: 48 tablet, Refills: 0      !! promethazine (PHENERGAN) 12.5 MG tablet Take 1 tablet (12.5 mg total) by mouth every 6 (six) hours.  Qty: 48 tablet, Refills: 0       !! - Potential duplicate medications found. Please discuss with provider.      CONTINUE these medications which have CHANGED    Details   LORazepam (ATIVAN) 1 MG tablet Take 1 tablet (1 mg total) by mouth every 6 (six) hours as needed. PRN cyclic vomiting  Qty: 30 tablet, Refills: 0      nortriptyline (PAMELOR) 50 MG  capsule Take 1 capsule (50 mg total) by mouth nightly.  Qty: 30 capsule, Refills: 0         CONTINUE these medications which have NOT CHANGED    Details   amLODIPine (NORVASC) 5 MG tablet Take 2 tablets (10 mg total) by mouth daily.  Qty: 30 tablet, Refills: 0      insulin glargine (LANTUS) 100 UNIT/ML injection Inject 10 Units into the skin nightly. Check am fasting sugars. Increase by 1 unit every morning for readings >120. For max of 30 units  Qty: 3 mL, Refills: 0      lisinopril (PRINIVIL,ZESTRIL) 40 MG tablet Take 1 tablet (40 mg total) by mouth daily.  Qty: 30 tablet, Refills: 0      metoclopramide (REGLAN) 5 MG tablet Take 2 tablets (10 mg total) by mouth 3 (three) times daily.  Refills: 0      metoprolol (LOPRESSOR) 25 MG tablet Take 1 tablet (25 mg total) by mouth every 12 (twelve) hours.  Qty: 30 tablet, Refills: 0      pantoprazole (PROTONIX) 40 MG tablet Take 40 mg by mouth daily.      !! promethazine (PHENERGAN) 25 MG tablet Take 1 tablet (25 mg total) by mouth every 6 (six) hours as needed.  Qty: 30 tablet, Refills: 0      sucralfate (CARAFATE) 1 G tablet Take 1 tablet (1 g total) by mouth 4 (four) times daily.  Qty: 90 tablet, Refills: 0      gabapentin (NEURONTIN) 300 MG capsule Take 300 mg by mouth nightly.      polyethylene glycol (MIRALAX) packet Take 17 g by mouth 2 (two) times daily.  Qty: 14 each, Refills: 0       !! - Potential duplicate medications found. Please discuss with provider.            Discharge Instructions:   Follow up with PCP in 3 days.    Follow up with Dr. Ernst Bowler GI in 2 weeks    Follow up with Dr. Burton Apley GI motility specialist [facility] in 2 weeks.     Signed by: Erenest Rasher

## 2013-02-20 NOTE — Progress Notes (Signed)
[facility] Attending Note    This patient was reviewed on rounds with Dr. Caryn Section.  Physical exam confirmed and duplicated.  I agree with assessment and plan as outlined.    Feeling much better today after scheduled dosing of antiemetics and ativan  Tolerated oatmeal and french toast for breakfast  Observe for a few more hours with anticipated discharge today     Patient Active Problem List   Diagnosis   . GERD (gastroesophageal reflux disease)   . Gastritis without bleeding   . Type 2 diabetes, uncontrolled, with neuropathy   . Nausea & vomiting   . Benign essential hypertension   . Diabetic gastroparesis   . Abdominal pain   . Gastroparesis     Lenox Ahr, MD  [facility]  [phone]

## 2013-02-23 ENCOUNTER — Inpatient Hospital Stay
Admission: EM | Admit: 2013-02-23 | Discharge: 2013-02-28 | DRG: 074 | Disposition: A | Discharge status: Home / Self Care | Payer: BC Managed Care – PPO | Attending: Family Medicine | Admitting: Family Medicine

## 2013-02-23 ENCOUNTER — Inpatient Hospital Stay: Payer: BC Managed Care – PPO | Admitting: Family Medicine

## 2013-02-23 DIAGNOSIS — E1142 Type 2 diabetes mellitus with diabetic polyneuropathy: Secondary | ICD-10-CM | POA: Diagnosis present

## 2013-02-23 DIAGNOSIS — R109 Unspecified abdominal pain: Secondary | ICD-10-CM

## 2013-02-23 DIAGNOSIS — R112 Nausea with vomiting, unspecified: Secondary | ICD-10-CM | POA: Diagnosis present

## 2013-02-23 DIAGNOSIS — R000 Tachycardia, unspecified: Secondary | ICD-10-CM | POA: Diagnosis present

## 2013-02-23 DIAGNOSIS — K3184 Gastroparesis: Secondary | ICD-10-CM | POA: Diagnosis present

## 2013-02-23 DIAGNOSIS — Z794 Long term (current) use of insulin: Secondary | ICD-10-CM

## 2013-02-23 DIAGNOSIS — IMO0002 Reserved for concepts with insufficient information to code with codable children: Secondary | ICD-10-CM | POA: Diagnosis present

## 2013-02-23 DIAGNOSIS — Z833 Family history of diabetes mellitus: Secondary | ICD-10-CM

## 2013-02-23 DIAGNOSIS — Z79899 Other long term (current) drug therapy: Secondary | ICD-10-CM

## 2013-02-23 DIAGNOSIS — K219 Gastro-esophageal reflux disease without esophagitis: Secondary | ICD-10-CM | POA: Diagnosis present

## 2013-02-23 DIAGNOSIS — I10 Essential (primary) hypertension: Secondary | ICD-10-CM

## 2013-02-23 DIAGNOSIS — E1149 Type 2 diabetes mellitus with other diabetic neurological complication: Principal | ICD-10-CM

## 2013-02-23 LAB — COMPREHENSIVE METABOLIC PANEL
ALT: 24 U/L (ref 0–55)
AST (SGOT): 16 U/L (ref 5–34)
Albumin/Globulin Ratio: 1.2 (ref 0.9–2.2)
Albumin: 4.5 g/dL (ref 3.5–5.0)
Alkaline Phosphatase: 102 U/L (ref 40–150)
BUN: 9 mg/dL (ref 9.0–21.0)
Bilirubin, Total: 0.5 mg/dL (ref 0.2–1.2)
CO2: 24 mEq/L (ref 22–29)
Calcium: 10.3 mg/dL (ref 8.5–10.5)
Chloride: 104 mEq/L (ref 98–107)
Creatinine: 1.3 mg/dL (ref 0.7–1.3)
Globulin: 3.8 g/dL — ABNORMAL HIGH (ref 2.0–3.6)
Glucose: 311 mg/dL — ABNORMAL HIGH (ref 70–100)
Potassium: 4 mEq/L (ref 3.5–5.1)
Protein, Total: 8.3 g/dL (ref 6.0–8.3)
Sodium: 142 mEq/L (ref 136–145)

## 2013-02-23 LAB — CBC AND DIFFERENTIAL
Basophils Absolute Automated: 0.01 10*3/uL (ref 0.00–0.20)
Basophils Automated: 0 %
Eosinophils Absolute Automated: 0.01 10*3/uL (ref 0.00–0.70)
Eosinophils Automated: 0 %
Hematocrit: 41.6 % — ABNORMAL LOW (ref 42.0–52.0)
Hgb: 14.2 g/dL (ref 13.0–17.0)
Immature Granulocytes Absolute: 0.03 10*3/uL
Immature Granulocytes: 0 %
Lymphocytes Absolute Automated: 0.94 10*3/uL (ref 0.50–4.40)
Lymphocytes Automated: 11 %
MCH: 27.8 pg — ABNORMAL LOW (ref 28.0–32.0)
MCHC: 34.1 g/dL (ref 32.0–36.0)
MCV: 81.4 fL (ref 80.0–100.0)
MPV: 9.8 fL (ref 9.4–12.3)
Monocytes Absolute Automated: 0.32 10*3/uL (ref 0.00–1.20)
Monocytes: 4 %
Neutrophils Absolute: 7.34 10*3/uL (ref 1.80–8.10)
Neutrophils: 85 %
Nucleated RBC: 0 (ref 0–1)
Platelets: 298 10*3/uL (ref 140–400)
RBC: 5.11 10*6/uL (ref 4.70–6.00)
RDW: 13 % (ref 12–15)
WBC: 8.65 10*3/uL (ref 3.50–10.80)

## 2013-02-23 LAB — GFR: EGFR: >60

## 2013-02-23 LAB — LIPASE: Lipase: 37 U/L (ref 8–78)

## 2013-02-23 MED ORDER — SODIUM CHLORIDE 0.9 % IV BOLUS
1000.0000 mL | Freq: Once | INTRAVENOUS | Status: AC
Start: 2013-02-23 — End: 2013-02-24
  Administered 2013-02-23: 1000 mL via INTRAVENOUS

## 2013-02-23 MED ORDER — MORPHINE SULFATE 4 MG/ML IJ/IV SOLN (WRAP)
4.0000 mg | Freq: Once | Status: AC
Start: 2013-02-23 — End: 2013-02-24
  Administered 2013-02-24: 4 mg via INTRAVENOUS
  Filled 2013-02-23: qty 1

## 2013-02-23 MED ORDER — PROMETHAZINE HCL 25 MG/ML IJ SOLN
12.5000 mg | Freq: Once | INTRAMUSCULAR | Status: AC
Start: 2013-02-23 — End: 2013-02-23
  Administered 2013-02-23: 12.5 mg via INTRAVENOUS
  Filled 2013-02-23: qty 1

## 2013-02-23 MED ORDER — ONDANSETRON HCL 4 MG/2ML IJ SOLN
4.0000 mg | Freq: Once | INTRAMUSCULAR | Status: AC
Start: 2013-02-23 — End: 2013-02-23
  Administered 2013-02-23: 4 mg via INTRAVENOUS
  Filled 2013-02-23: qty 2

## 2013-02-23 MED ORDER — MORPHINE SULFATE 4 MG/ML IJ/IV SOLN (WRAP)
4.0000 mg | Freq: Once | Status: AC
Start: 2013-02-23 — End: 2013-02-23
  Administered 2013-02-23: 4 mg via INTRAVENOUS
  Filled 2013-02-23: qty 1

## 2013-02-23 NOTE — ED Notes (Signed)
ED resident, Dr. Lesly Rubenstein, informed of pt elevated BP; no further orders given. Pt place on monitor, BP to cycle q 30 minutes.

## 2013-02-23 NOTE — ED Provider Notes (Signed)
Physician/Midlevel provider first contact with patient: 02/23/13 2220         History     Chief Complaint   Patient presents with   . Emesis   . Abdominal Pain     Patient is a 38 y.o. male presenting with vomiting and abdominal pain.   Emesis   Associated symptoms include abdominal pain.   Abdominal Pain  The primary symptoms of the illness include abdominal pain, nausea and vomiting.   Pt is a 38 y/o male w/PMHx of gastroparesis, DM-2, HTN, p/w n/v/epigastric abd pain starting at 2pm today. Symptoms have been constant since then, pt says he has had >20 episodes of yellow emesis, took zofran odt x 2 w/o relief. Unable to tolerate PO. Of note, pt was admitted 1 wk ago for same symptoms, hospitalized 5 days.      Past Medical History   Diagnosis Date   . Diabetes    . Hypertensive disorder    . Gastroparesis        Past Surgical History   Procedure Date   . Cholecystectomy    . Knee arthroscopy w/ acl reconstruction      left   . Egd 08/08/2012     Procedure: EGD;  Surgeon: [name], MD;  [facility];  Service: Gastroenterology;  Laterality: N/A;  w\bx       Family History   Problem Relation Age of Onset   . Diabetes Father    . Diabetes Maternal Grandmother    . Diabetes Paternal Grandmother        Social  History   Substance Use Topics   . Smoking status: Never Smoker    . Smokeless tobacco: Never Used   . Alcohol Use: Yes      Comment: "occasional"       .     No Known Allergies    Current/Home Medications    AMLODIPINE (NORVASC) 5 MG TABLET    Take 2 tablets (10 mg total) by mouth daily.    GABAPENTIN (NEURONTIN) 300 MG CAPSULE    Take 300 mg by mouth nightly.    INSULIN GLARGINE (LANTUS) 100 UNIT/ML INJECTION    Inject 10 Units into the skin nightly. Check am fasting sugars. Increase by 1 unit every morning for readings >120. For max of 30 units    LISINOPRIL (PRINIVIL,ZESTRIL) 40 MG TABLET    Take 1 tablet (40 mg total) by mouth daily.    LORAZEPAM (ATIVAN) 1 MG TABLET    Take 1  tablet (1 mg total) by mouth every 6 (six) hours as needed. PRN cyclic vomiting    METOCLOPRAMIDE (REGLAN) 5 MG TABLET    Take 2 tablets (10 mg total) by mouth 3 (three) times daily.    METOPROLOL (LOPRESSOR) 25 MG TABLET    Take 1 tablet (25 mg total) by mouth every 12 (twelve) hours.    NORTRIPTYLINE (PAMELOR) 50 MG CAPSULE    Take 1 capsule (50 mg total) by mouth nightly.    ONDANSETRON (ZOFRAN) 4 MG TABLET    Take 1 tablet (4 mg total) by mouth every 6 (six) hours.    PANTOPRAZOLE (PROTONIX) 40 MG TABLET    Take 40 mg by mouth daily.    POLYETHYLENE GLYCOL (MIRALAX) PACKET    Take 17 g by mouth 2 (two) times daily.    PROMETHAZINE (PHENERGAN) 12.5 MG TABLET    Take 1 tablet (12.5 mg total) by mouth every 6 (six) hours.  PROMETHAZINE (PHENERGAN) 25 MG TABLET    Take 1 tablet (25 mg total) by mouth every 6 (six) hours as needed.    SUCRALFATE (CARAFATE) 1 G TABLET    Take 1 tablet (1 g total) by mouth 4 (four) times daily.        Review of Systems   Constitutional: Negative.    HENT: Negative.    Respiratory: Negative.    Gastrointestinal: Positive for nausea, vomiting and abdominal pain.   Genitourinary: Negative.    Musculoskeletal: Negative.    All other systems reviewed and are negative.        Physical Exam    BP: 163/107 mmHg, Heart Rate: 129 , Temp: 98 F (36.7 C), Resp Rate: 20 , SpO2: 99 %, Weight: 127.007 kg    Physical Exam   Vitals reviewed.  Constitutional: He is oriented to person, place, and time. He appears well-developed and well-nourished.        Actively vomiting   HENT:   Head: Normocephalic and atraumatic.   Eyes: Conjunctivae normal and EOM are normal.   Neck: Normal range of motion. Neck supple.   Cardiovascular: Regular rhythm.         Tachycardic     Pulmonary/Chest: Effort normal and breath sounds normal.   Abdominal: Soft. Bowel sounds are normal. He exhibits no distension. There is tenderness (epigastric). There is no rebound and no guarding.   Musculoskeletal: He exhibits no edema.    Neurological: He is alert and oriented to person, place, and time.   Skin: Skin is warm and dry.       MDM and ED Course     ED Medication Orders      Start     Status Ordering Provider    02/24/13 0404   promethazine (PHENERGAN) injection 12.5 mg   Once      Route: Intravenous  Ordered Dose: 12.5 mg         Last MAR action:  Given Albina Billet    02/24/13 0403   morphine injection 4 mg   Once      Route: Intravenous  Ordered Dose: 4 mg         Last MAR action:  Given Albina Billet    02/24/13 0117   sodium chloride 0.9 % bolus 1,000 mL   Once      Route: Intravenous  Ordered Dose: 1,000 mL         Last MAR action:  Loistine Simas    02/24/13 0116   cloNIDine (CATAPRES) tablet 0.2 mg   Once      Route: Oral  Ordered Dose: 0.2 mg         Last MAR action:  Given Hakim Minniefield H    02/24/13 0109   ondansetron (ZOFRAN) injection 4 mg   Once      Route: Intravenous  Ordered Dose: 4 mg         Last MAR action:  Given Felicity Coyer J    02/23/13 2359   morphine injection 4 mg   Once      Route: Intravenous  Ordered Dose: 4 mg         Last MAR action:  Given Albina Billet    02/23/13 2304   sodium chloride 0.9 % bolus 1,000 mL   Once      Route: Intravenous  Ordered Dose: 1,000 mL         Last MAR action:  Stopped  Albina Billet    02/23/13 2230   promethazine (PHENERGAN) injection 12.5 mg   Once      Route: Intravenous  Ordered Dose: 12.5 mg         Last MAR action:  Given Albina Billet    02/23/13 2229   morphine injection 4 mg   Once      Route: Intravenous  Ordered Dose: 4 mg         Last MAR action:  Given Albina Billet    02/23/13 2137   sodium chloride 0.9 % bolus 1,000 mL   Once      Route: Intravenous  Ordered Dose: 1,000 mL         Last MAR action:  [facility], Elenora Fender J    02/23/13 2137   ondansetron (ZOFRAN) injection 4 mg   Once      Route: Intravenous  Ordered Dose: 4 mg         Last MAR action:  Given SALAMEH, KARIM J                 MDM  Number of Diagnoses or Management  Options  Abdominal pain:   Nausea & vomiting:   Diagnosis management comments: 43 M w/gastroparesis, p/w n/v/epigastric pain x 9 hrs. Plan for labs, zofran, IVFs. Given zofran in triage which didn't resolve emesis, phenergan ordered.     2335: Pt resting comfortably in stretcher, no active vomiting after phenergan. Requesting dilaudid next time for pain control instead of morphine.     0400: Discussed d/ plan with pt, he said he doesn't feel well enough to leave and needs pain control. Pt is not happy with getting another dose of morphine (seeking dilaudid). Plan to admit as bounce back to [facility].     0415: Sherron Monday /wFFX FP resident, will admit to med-tele under Dr. Theda Belfast.    0340: Pt sleeping in stretcher comfortably. Has received multiple NS boluses. No active vomiting. Plan to d/c home w/PMD follow up.         Procedures    Clinical Impression & Disposition     Clinical Impression  Final diagnoses:   Nausea & vomiting   Abdominal pain        ED Disposition     Admit Bed Type: Telemetry [5]  Admitting Physician: Willia Craze [[id number]]  Patient Class: Hospital Outpatient Surgery (Amb Proc) [106]             New Prescriptions    No medications on file        Treatment Team: Scribe: Pieter Partridge, MD  Resident  02/23/13 2303    Albina Billet, MD  Resident  02/23/13 1610    Albina Billet, MD  Resident  02/23/13 9604    Albina Billet, MD  Resident  02/23/13 5409    Albina Billet, MD  Resident  02/23/13 8119    Albina Billet, MD  Resident  02/24/13 [phone]    Albina Billet, MD  Resident  02/24/13 [phone]

## 2013-02-23 NOTE — ED Notes (Signed)
Patient here for eval of vomiting and abdominal pain.  Hx gastroparesis, states this is the same.  Recently dced from [facility] for same.  Alert and oriented x 3.  Actively vomiting in triage.

## 2013-02-23 NOTE — ED Notes (Signed)
I have briefly evaluated this patient as triage physician in order to facilitate and initiate the ordering of laboratory and imaging studies as needed.  H/o gastroparesis, recently admitted for same. Presents with similar sxs.   Plan: labs, ivf, zofran    Latanya Presser, MD  02/23/13 2137

## 2013-02-23 NOTE — ED Provider Notes (Signed)
Physician/Midlevel provider first contact with patient: 02/23/13 2220         This patient was seen by the PA and the HPI, PE and plan were discussed with me. I saw pt and discussed plan and answered questions    [name] is a 38 y.o. male with PMHx gastroparesis, DMII and HTN presents with emesis a/w epigastric abd pain starting around 1400. Pt reports nausea, vomiting (>20) that is yellow in color and constant epigastric pain since onset. He has taken 2 tabs of Zofran with no relief. Of note, patient was hospitalized for similar sxs 1 week ago and dx'd with gastroparesis.     PMD: [name], MD  PE abd soft nontender  MDM Diabetic gastroparesis  IVF  Check labs  admit        I was acting as a scribe for Samuel Bouche, MD on [name]  Treatment Team: Scribe: Gemma Payor   I am the first provider for this patient and I personally performed the services documented. Treatment Team: Scribe: Gemma Payor is scribing for me on Ivanoff,Kayl L. This note accurately reflects work and decisions made by me.  Samuel Bouche, MD      Samuel Bouche, MD  03/04/13 [phone]

## 2013-02-24 ENCOUNTER — Inpatient Hospital Stay: Payer: BC Managed Care – PPO

## 2013-02-24 LAB — ECG 12-LEAD
Atrial Rate: 129 {beats}/min
P Axis: 84 degrees
P-R Interval: 154 ms
Q-T Interval: 302 ms
QRS Duration: 76 ms
QTC Calculation (Bezet): 442 ms
R Axis: 50 degrees
T Axis: -9 degrees
Ventricular Rate: 129 {beats}/min

## 2013-02-24 LAB — GLUCOSE WHOLE BLOOD - POCT
Whole Blood Glucose POCT: 203 mg/dL — ABNORMAL HIGH (ref 70–100)
Whole Blood Glucose POCT: 189 mg/dL — ABNORMAL HIGH (ref 70–100)

## 2013-02-24 MED ORDER — PROMETHAZINE HCL 25 MG/ML IJ SOLN
12.5000 mg | Freq: Four times a day (QID) | INTRAMUSCULAR | Status: DC | PRN
Start: 2013-02-24 — End: 2013-02-28
  Administered 2013-02-24 – 2013-02-27 (×9): 12.5 mg via INTRAVENOUS
  Filled 2013-02-24 (×12): qty 1

## 2013-02-24 MED ORDER — PANTOPRAZOLE SODIUM 40 MG IV SOLR
40.0000 mg | Freq: Every day | INTRAVENOUS | Status: DC
Start: 2013-02-24 — End: 2013-02-28
  Administered 2013-02-24 – 2013-02-28 (×5): 40 mg via INTRAVENOUS
  Filled 2013-02-24 (×5): qty 40

## 2013-02-24 MED ORDER — SODIUM CHLORIDE 0.9 % IV SOLN
INTRAVENOUS | Status: DC
Start: 2013-02-24 — End: 2013-02-26

## 2013-02-24 MED ORDER — GLUCAGON HCL (RDNA) 1 MG IJ SOLR
1.0000 mg | INTRAMUSCULAR | Status: DC | PRN
Start: 2013-02-24 — End: 2013-02-28

## 2013-02-24 MED ORDER — HYDROMORPHONE HCL PF 1 MG/ML IJ SOLN
0.2000 mg | INTRAMUSCULAR | Status: DC | PRN
Start: 2013-02-24 — End: 2013-02-28
  Administered 2013-02-24 – 2013-02-27 (×15): 0.2 mg via INTRAVENOUS
  Filled 2013-02-24 (×17): qty 1

## 2013-02-24 MED ORDER — ONDANSETRON HCL 4 MG/2ML IJ SOLN
4.0000 mg | Freq: Once | INTRAMUSCULAR | Status: AC
Start: 2013-02-24 — End: 2013-02-24
  Administered 2013-02-24: 4 mg via INTRAVENOUS
  Filled 2013-02-24: qty 2

## 2013-02-24 MED ORDER — METOPROLOL TARTRATE 25 MG PO TABS
100.0000 mg | ORAL_TABLET | Freq: Two times a day (BID) | ORAL | Status: DC
Start: 2013-02-24 — End: 2013-02-28
  Administered 2013-02-24 – 2013-02-28 (×7): 100 mg via ORAL
  Filled 2013-02-24 (×9): qty 4

## 2013-02-24 MED ORDER — MORPHINE SULFATE 4 MG/ML IJ/IV SOLN (WRAP)
4.0000 mg | Freq: Once | Status: AC
Start: 2013-02-24 — End: 2013-02-24
  Administered 2013-02-24: 4 mg via INTRAVENOUS
  Filled 2013-02-24: qty 1

## 2013-02-24 MED ORDER — HYDRALAZINE HCL 20 MG/ML IJ SOLN
10.0000 mg | Freq: Four times a day (QID) | INTRAMUSCULAR | Status: DC | PRN
Start: 2013-02-24 — End: 2013-02-28
  Administered 2013-02-24 – 2013-02-27 (×3): 10 mg via INTRAVENOUS
  Filled 2013-02-24 (×4): qty 1

## 2013-02-24 MED ORDER — AMLODIPINE BESYLATE 5 MG PO TABS
10.0000 mg | ORAL_TABLET | Freq: Every day | ORAL | Status: DC
Start: 2013-02-24 — End: 2013-02-28
  Administered 2013-02-24 – 2013-02-28 (×4): 10 mg via ORAL
  Filled 2013-02-24 (×5): qty 2

## 2013-02-24 MED ORDER — GABAPENTIN 300 MG PO CAPS
300.0000 mg | ORAL_CAPSULE | Freq: Every evening | ORAL | Status: DC
Start: 2013-02-24 — End: 2013-02-28
  Administered 2013-02-25 – 2013-02-27 (×2): 300 mg via ORAL
  Filled 2013-02-24 (×4): qty 1

## 2013-02-24 MED ORDER — KETOROLAC TROMETHAMINE 30 MG/ML IJ SOLN
INTRAMUSCULAR | Status: AC
Start: 2013-02-24 — End: 2013-02-24
  Administered 2013-02-24: 30 mg via INTRAVENOUS
  Filled 2013-02-24: qty 1

## 2013-02-24 MED ORDER — NORTRIPTYLINE HCL 25 MG PO CAPS
50.0000 mg | ORAL_CAPSULE | Freq: Every evening | ORAL | Status: DC
Start: 2013-02-24 — End: 2013-02-28
  Administered 2013-02-27: 50 mg via ORAL
  Filled 2013-02-24 (×6): qty 2

## 2013-02-24 MED ORDER — POLYETHYLENE GLYCOL 3350 17 G PO PACK
17.0000 g | PACK | Freq: Two times a day (BID) | ORAL | Status: DC
Start: 2013-02-24 — End: 2013-02-28
  Administered 2013-02-27 – 2013-02-28 (×2): 17 g via ORAL
  Filled 2013-02-24 (×9): qty 1

## 2013-02-24 MED ORDER — INSULIN REGULAR HUMAN 100 UNIT/ML IJ SOLN
1.0000 [IU] | INTRAMUSCULAR | Status: DC | PRN
Start: 2013-02-24 — End: 2013-02-28
  Administered 2013-02-24 – 2013-02-26 (×2): 4 [IU] via SUBCUTANEOUS
  Administered 2013-02-26: 2 [IU] via SUBCUTANEOUS
  Administered 2013-02-26: 4 [IU] via SUBCUTANEOUS
  Administered 2013-02-27 (×2): 2 [IU] via SUBCUTANEOUS
  Administered 2013-02-27 (×2): 4 [IU] via SUBCUTANEOUS
  Administered 2013-02-28: 2 [IU] via SUBCUTANEOUS
  Filled 2013-02-24 (×2): qty 40
  Filled 2013-02-24 (×3): qty 20
  Filled 2013-02-24: qty 10
  Filled 2013-02-24: qty 40
  Filled 2013-02-24: qty 20
  Filled 2013-02-24: qty 40

## 2013-02-24 MED ORDER — SUCRALFATE 1 G PO TABS
1.0000 g | ORAL_TABLET | Freq: Four times a day (QID) | ORAL | Status: DC
Start: 2013-02-24 — End: 2013-02-28
  Administered 2013-02-24 – 2013-02-28 (×10): 1 g via ORAL
  Filled 2013-02-24 (×14): qty 1

## 2013-02-24 MED ORDER — DEXTROSE 50 % IV SOLN
25.0000 mL | INTRAVENOUS | Status: DC | PRN
Start: 2013-02-24 — End: 2013-02-28

## 2013-02-24 MED ORDER — SODIUM CHLORIDE 0.9 % IV SOLN
250.0000 mg | Freq: Three times a day (TID) | INTRAVENOUS | Status: DC
Start: 2013-02-24 — End: 2013-02-28
  Administered 2013-02-24 – 2013-02-28 (×13): 250 mg via INTRAVENOUS
  Filled 2013-02-24 (×14): qty 250

## 2013-02-24 MED ORDER — NALOXONE HCL 0.4 MG/ML IJ SOLN
0.2000 mg | INTRAMUSCULAR | Status: DC | PRN
Start: 2013-02-24 — End: 2013-02-28

## 2013-02-24 MED ORDER — CLONIDINE HCL 0.1 MG PO TABS
0.2000 mg | ORAL_TABLET | Freq: Once | ORAL | Status: AC
Start: 2013-02-24 — End: 2013-02-24
  Administered 2013-02-24: 0.2 mg via ORAL
  Filled 2013-02-24: qty 2

## 2013-02-24 MED ORDER — KETOROLAC TROMETHAMINE 30 MG/ML IJ SOLN
30.0000 mg | Freq: Four times a day (QID) | INTRAMUSCULAR | Status: DC | PRN
Start: 2013-02-24 — End: 2013-02-28
  Administered 2013-02-24 – 2013-02-26 (×2): 30 mg via INTRAVENOUS
  Filled 2013-02-24 (×2): qty 1

## 2013-02-24 MED ORDER — GLUCOSE 40 % PO GEL
15.0000 g | ORAL | Status: DC | PRN
Start: 2013-02-24 — End: 2013-02-28

## 2013-02-24 MED ORDER — PROMETHAZINE HCL 25 MG/ML IJ SOLN
12.5000 mg | Freq: Once | INTRAMUSCULAR | Status: AC
Start: 2013-02-24 — End: 2013-02-24
  Administered 2013-02-24: 12.5 mg via INTRAVENOUS
  Filled 2013-02-24: qty 1

## 2013-02-24 MED ORDER — LISINOPRIL 10 MG PO TABS
40.0000 mg | ORAL_TABLET | Freq: Every day | ORAL | Status: DC
Start: 2013-02-24 — End: 2013-02-28
  Administered 2013-02-24 – 2013-02-28 (×4): 40 mg via ORAL
  Filled 2013-02-24 (×2): qty 4
  Filled 2013-02-24: qty 2
  Filled 2013-02-24 (×3): qty 4

## 2013-02-24 MED ORDER — ENOXAPARIN SODIUM 40 MG/0.4ML SC SOLN
40.0000 mg | Freq: Every day | SUBCUTANEOUS | Status: DC
Start: 2013-02-24 — End: 2013-02-28
  Administered 2013-02-24 – 2013-02-28 (×5): 40 mg via SUBCUTANEOUS
  Filled 2013-02-24 (×5): qty 0.4

## 2013-02-24 MED ORDER — METOCLOPRAMIDE HCL 5 MG/ML IJ SOLN
10.0000 mg | Freq: Four times a day (QID) | INTRAMUSCULAR | Status: DC
Start: 2013-02-24 — End: 2013-02-28
  Administered 2013-02-24 – 2013-02-28 (×16): 10 mg via INTRAVENOUS
  Filled 2013-02-24 (×15): qty 2

## 2013-02-24 MED ORDER — MORPHINE SULFATE 4 MG/ML IJ/IV SOLN (WRAP)
Status: AC
Start: 2013-02-24 — End: 2013-02-24
  Administered 2013-02-24: 2 mg via INTRAVENOUS
  Filled 2013-02-24: qty 1

## 2013-02-24 MED ORDER — MORPHINE SULFATE 4 MG/ML IJ/IV SOLN (WRAP)
2.0000 mg | Status: DC | PRN
Start: 2013-02-24 — End: 2013-02-24

## 2013-02-24 MED ORDER — SODIUM CHLORIDE 0.9 % IV BOLUS
1000.0000 mL | Freq: Once | INTRAVENOUS | Status: AC
Start: 2013-02-24 — End: 2013-02-24
  Administered 2013-02-24: 1000 mL via INTRAVENOUS

## 2013-02-24 MED ORDER — METOCLOPRAMIDE HCL 5 MG/ML IJ SOLN
INTRAMUSCULAR | Status: AC
Start: 2013-02-24 — End: 2013-02-24
  Administered 2013-02-24: 10 mg via INTRAVENOUS
  Filled 2013-02-24: qty 2

## 2013-02-24 NOTE — Consults (Signed)
[facility]  CONSULTATION NOTE    Date Time: 02/24/2013 5:08 PM  Patient Name: [name]  Requesting Physician: Willia Craze, MD       Reason for Consultation:   Gastroparesis exacerbation    Assessment and Plan:   Assessment/Plan:  Pt is a 38 y/o male with PMH uncontrolled HTN, DM complicated by diabetic neuropathy who presented with N/V, likely from gastroparesis exacerbation (5 inpatient admissions in the last month).    1. Nausea and vomiting - gastroparesis exacerbation likely exacerbated by narcotic bowel.  Recommend weaning off narcotics as it can worsen gastroparesis and exacerbate symptoms of nausea vomiting and pain. Extensive discussion regarding PEG placement for nutrition and for venting purposes took place; at this time, patient continues to refuse PEG placement, stating that he would like to try Domperidone first. He is also not interested in weaning off narcotics at this time as they help temporarily.     ---At this time no additional medical options for recurrent gastroparesis (multifactorial, iatrogenic as well as underlying poorly controlled DM2). Continue with Reglan (with known risk of tardive dyskinesia, and parkinsonian effects), erythromycin, zosyn and phenergan, and nortriptyline (caution with QT-prolongation).    ---He has again been advised to see motility specialist at [facility] (Dr. Burton Apley) or at [facility] in [place] (Dr. Judd Gaudier) upon discharge.    ---Please re-consult GI when pt is weaned off narcotics for further conversation regarding PEG placement.     --Will sign off. Please call with questions.      History:   [name] is a 38 y.o. male with poorly controlled DM and HTN as well as gastroparesis who presents to the hospital on 02/23/2013 with nausea and vomiting and abdominal pain which began again yesterday afternoon. He had 20+ episodes of yellow emesis yesterday. He denies GI bleeding. He is requesting Dilaudid as "it's the only  thing that helps the pain." During his previous admission, he stated that he was not interested in PEG tube at this time. He remains uninterested in PEG placement until after he can have an opinion at a motility center.    Past Medical History:     Past Medical History   Diagnosis Date   . Diabetes    . Hypertensive disorder    . Gastroparesis        Past Surgical History:     Past Surgical History   Procedure Date   . Cholecystectomy    . Knee arthroscopy w/ acl reconstruction      left   . Egd 08/08/2012     Procedure: EGD;  Surgeon: [name], MD;  [facility];  Service: Gastroenterology;  Laterality: N/A;  w\bx       Family History:     Family History   Problem Relation Age of Onset   . Diabetes Father    . Diabetes Maternal Grandmother    . Diabetes Paternal Grandmother        Social History:     History     Social History   . Marital Status: Single     Spouse Name: N/A     Number of Children: N/A   . Years of Education: N/A     Social History Main Topics   . Smoking status: Never Smoker    . Smokeless tobacco: Never Used   . Alcohol Use: Yes      Comment: "occasional"   . Drug Use: No   . Sexually  Active: Not on file     Other Topics Concern   . Not on file     Social History Narrative   . No narrative on file       Allergies:   No Known Allergies    Medications:     Current Facility-Administered Medications   Medication Dose Route Frequency   . amLODIPine  10 mg Oral Daily   . [COMPLETED] cloNIDine  0.2 mg Oral Once   . enoxaparin  40 mg Subcutaneous Daily   . erythromycin  250 mg Intravenous Q8H SCH   . gabapentin  300 mg Oral QHS   . lisinopril  40 mg Oral Daily   . metoclopramide  10 mg Intravenous Q6H SCH   . metoprolol  100 mg Oral Q12H SCH   . [COMPLETED] morphine  4 mg Intravenous Once   . [COMPLETED] morphine  4 mg Intravenous Once   . [COMPLETED] morphine  4 mg Intravenous Once   . nortriptyline  50 mg Oral QHS   . [COMPLETED] ondansetron  4 mg Intravenous Once   .  [COMPLETED] ondansetron  4 mg Intravenous Once   . pantoprazole  40 mg Intravenous Daily   . polyethylene glycol  17 g Oral Q12H SCH   . [COMPLETED] promethazine  12.5 mg Intravenous Once   . [COMPLETED] promethazine  12.5 mg Intravenous Once   . [COMPLETED] sodium chloride  1,000 mL Intravenous Once   . [COMPLETED] sodium chloride  1,000 mL Intravenous Once   . [COMPLETED] sodium chloride  1,000 mL Intravenous Once   . sucralfate  1 g Oral QID       Review of Systems:   Complete 12 Point ROS negative or normal except those mentioned in HPI above.    Physical Exam:     Filed Vitals:    02/24/13 1527   BP: 191/117   Pulse: 128   Temp: 98.1 F (36.7 C)   Resp: 18   SpO2: 96%     General appearance: Well developed, well nourished, appears stated age and in NAD. Resting comfortably.  Eyes: Sclera anicteric, pink conjunctivae, no ptosis  ENMT: mucous membranes moist, nose and ears appear normal.  Oropharynx clear.  Chest: Non labored respirations, no audible wheezing, no clubbing or cyanosis  CV:  Regular rate and rhythm, no JVD, no LE edema  Abdomen: soft, non-tender, non-distended, no masses or organomegaly  Skin: Normal color and turgor, no rashes, no suspicious skin lesions noted  Neuro: CN II-XII grossly intact.  No gross movement disorders noted.  Mental status: Appropriate affect, alert and oriented x 3    Labs Reviewed:     Recent Labs   [facility] 02/23/13 2147    WBC 8.65    HGB 14.2    HCT 41.6*    PLT 298    MCV 81.4       Recent Labs   [facility] 02/23/13 2147    NA 142    K 4.0    CL 104    CO2 24    BUN 9.0    CREAT 1.3    GLU 311*    CA 10.3    MG --    PHOS --       Recent Labs   [facility] 02/23/13 2147    AST 16    ALT 24    ALKPHOS 102    BILITOTAL 0.5    BILIDIRECT --    PROT 8.3    ALB 4.5  No results found for this basename: PTT:2,PT:2,INR:2 in the last 72 hours     Radiology:   Radiological Procedure reviewed:  AXR 02/16/13: No free air or small bowel obstruction. Improved constipation.

## 2013-02-24 NOTE — ED Notes (Signed)
Patient is resting comfortably at this time, sleeping. Pt has not had episode of nausea or vomiting since last med admin.

## 2013-02-24 NOTE — H&P (Signed)
ADMISSION HISTORY AND PHYSICAL EXAM    Date Time: 02/24/2013 8:40 AM  Patient Name: [name]  Attending Physician: Samuel Bouche, MD  Primary Care Physician: [name], MD    CC: N/V      Assessment:   Principal Problem:   *Gastroparesis  Active Problems:   GERD (gastroesophageal reflux disease)   Type 2 diabetes, uncontrolled, with neuropathy   Nausea & vomiting   Benign essential hypertension    38 yo male with uncontrolled diabetes and diabetic neuropathy, gastroparesis, HTN presents with nausea and vomiting likely from gastroparesis (had similar admission 1 week ago).    Plan:   #Nausea and vomiting 2/2 gastroparesis exacerbation   - Normal lipase, no leukocytosis, normal LFTs  -Now S/p 3 L IVF in the ER, 8 mg total of morphine, phenergan 12.5 x 2, and zofran 4mg  IV x2 in the ER   - Abd XR to rule out obstruction   - mIVF 150 cc/hr   - Reglan 10 mg IV QID standing, Erythromycin 250 mg TID standing   - Phenergan IV prn   - Continue pamelor 50 mg  qhs  - Miralax daily to help promote bowel movement   - Protonix and carafate 1 g QID   -patient currently asking for dilaudid over morphine but will try to avoid all opioids and stick with toradol for pain control for now given adverse hypomotility effect of narcotics.  -consider GI consult (was seen by Dr. [place] [facility] at last admission) for repeat discussion of PEG placement    # Hypertension with tachycardia:  - Lisinopril 40mg  daily   - Amlodipine 10mg  daily   - Metoprolol 100mg  BID (may increase if persistent tachycardia once pain better controlled)  - Hydralazine PRN for SBP >180   -will check EKG now    # Diabetes   - Hold home medications while NPO   - Insulin SSI (high dose) with accuchecks  - Restart lantus when patient able to tolerate PO   - Continue gabapentin for diabetic neuropathy with plan to taper as outpatient once nortriptyline reaches steady state     # FEN/GI NPO except for sips with meds, NS   # PPX: SCDs, Lovenox, GI with home  PPI, miralax for GI ppx  #FULL CODE  -discussed with Dr. Theda Belfast    History of Presenting Illness:   [name] is a 38 y.o. male who presents to the hospital with uncontrolled nausea and vomiting starting at [facility] the day prior to admission. He was recently discharged from the hospital 1 week ago for similar symptoms. He was instructed to take zofran, phenergan and reglan at home. In addition, he was started on nortriptyline. He states that the current episode was unprovoked and led to very little intake of anything po yesterday. States that he has vomiting 25-30 times since yesterday whenDenies recent narcotic use, denies marijuana or other drug use. He also adamantly denies any alcohol use prior to this admission. He endorses abdominal pain. States that his emesis is non-bloody but yellow in consistency (which is typical for him). Denies fevers, chills.   In the ED, received fluids and antiemetics as well as morphine, but patient is seeking dilaudid as he says this is the only thing that helps him.    Past Medical History:     Past Medical History   Diagnosis Date   . Diabetes    . Hypertensive disorder    . Gastroparesis  Past Surgical History:     Past Surgical History   Procedure Date   . Cholecystectomy    . Knee arthroscopy w/ acl reconstruction      left   . Egd 08/08/2012     Procedure: EGD;  Surgeon: [name], MD;  Location[facility];  Service: Gastroenterology;  Laterality: N/A;  w\bx     Social History:     History   Smoking status   . Never Smoker    Smokeless tobacco   . Never Used     History   Alcohol Use   . Yes     Comment: "occasional"     History   Drug Use No       Allergies:   No Known Allergies    Medications:     Current/Home Medications    AMLODIPINE (NORVASC) 5 MG TABLET    Take 2 tablets (10 mg total) by mouth daily.    GABAPENTIN (NEURONTIN) 300 MG CAPSULE    Take 300 mg by mouth nightly.    INSULIN GLARGINE (LANTUS) 100 UNIT/ML INJECTION    Inject 10 Units  into the skin nightly. Check am fasting sugars. Increase by 1 unit every morning for readings >120. For max of 30 units    LISINOPRIL (PRINIVIL,ZESTRIL) 40 MG TABLET    Take 1 tablet (40 mg total) by mouth daily.    LORAZEPAM (ATIVAN) 1 MG TABLET    Take 1 tablet (1 mg total) by mouth every 6 (six) hours as needed. PRN cyclic vomiting    METOCLOPRAMIDE (REGLAN) 5 MG TABLET    Take 2 tablets (10 mg total) by mouth 3 (three) times daily.    METOPROLOL (LOPRESSOR) 25 MG TABLET    Take 1 tablet (25 mg total) by mouth every 12 (twelve) hours.    NORTRIPTYLINE (PAMELOR) 50 MG CAPSULE    Take 1 capsule (50 mg total) by mouth nightly.    ONDANSETRON (ZOFRAN) 4 MG TABLET    Take 1 tablet (4 mg total) by mouth every 6 (six) hours.    PANTOPRAZOLE (PROTONIX) 40 MG TABLET    Take 40 mg by mouth daily.    POLYETHYLENE GLYCOL (MIRALAX) PACKET    Take 17 g by mouth 2 (two) times daily.    PROMETHAZINE (PHENERGAN) 12.5 MG TABLET    Take 1 tablet (12.5 mg total) by mouth every 6 (six) hours.    PROMETHAZINE (PHENERGAN) 25 MG TABLET    Take 1 tablet (25 mg total) by mouth every 6 (six) hours as needed.    SUCRALFATE (CARAFATE) 1 G TABLET    Take 1 tablet (1 g total) by mouth 4 (four) times daily.      Review of Systems:   All other systems were reviewed and are negative except as noted in HPI    Physical Exam:   Patient Vitals for the past 24 hrs:   BP Temp Pulse Resp SpO2 Height Weight   02/24/13 0430 140/84 mmHg - - - 95 % - -   02/24/13 0330 169/104 mmHg - - - 97 % - -   02/24/13 0230 194/116 mmHg - - - 98 % - -   02/24/13 0200 189/108 mmHg - - - 97 % - -   02/24/13 0107 202/93 mmHg - - - 97 % - -   02/23/13 2328 189/119 mmHg - 121  16  100 % - -   02/23/13 2135 163/107 mmHg 98 F (36.7 C) 129  20  99 % 1.905 m (6\' 3" ) 127.007 kg (280 lb)     Body mass index is 35.00 kg/(m^2).  No intake or output data in the 24 hours ending 02/24/13 0840    General: awake, alert, oriented x 3, mild distress but patient began actively vomiting  during my exam  Mouth: MMM  Cardiovascular: tachycardic but regular rhythm, no murmurs, rubs or gallops  Lungs: clear to auscultation bilaterally, without wheezing, rhonchi, or rales  Abdomen: soft, epigastric tenderness to palpation, non-distended; no palpable masses, no hepatosplenomegaly, normoactive bowel sounds, no rebound or guarding  Extremities: no clubbing, cyanosis, or edema    Labs:     Results     Procedure Component Value Units Date/Time    Comprehensive Metabolic Panel (CMP) [[id number]]  (Abnormal) Collected:02/23/13 2147    Specimen Information:Blood Updated:02/23/13 2228     Glucose 311 (H) mg/dL      BUN 9.0 mg/dL      Creatinine 1.3 mg/dL      Sodium 409 mEq/L      Potassium 4.0 mEq/L      Chloride 104 mEq/L      CO2 24 mEq/L      CALCIUM 10.3 mg/dL      Protein, Total 8.3 g/dL      Albumin 4.5 g/dL      AST (SGOT) 16 U/L      ALT 24 U/L      Alkaline Phosphatase 102 U/L      Bilirubin, Total 0.5 mg/dL      Globulin 3.8 (H) g/dL      Albumin/Globulin Ratio 1.2     LAB-Lipase [[id number]] Collected:02/23/13 2147    Specimen Information:Blood Updated:02/23/13 2228     Lipase 37 U/L     GFR [[id number]] Collected:02/23/13 2147     EGFR >60.0 Updated:02/23/13 2228    CBC with Differential [[id number]]  (Abnormal) Collected:02/23/13 2147    Specimen Information:Blood / Blood Updated:02/23/13 2206     WBC 8.65 x10 3/uL      RBC 5.11 x10 6/uL      Hgb 14.2 g/dL      Hematocrit 52.8 (L) %      MCV 81.4 fL      MCH 27.8 (L) pg      MCHC 34.1 g/dL      RDW 13 %      Platelets 298 x10 3/uL      MPV 9.8 fL      Neutrophils 85 %      Lymphocytes Automated 11 %      Monocytes 4 %      Eosinophils Automated 0 %      Basophils Automated 0 %      Immature Granulocyte 0 %      Nucleated RBC 0      Neutrophils Absolute 7.34 x10 3/uL      Abs Lymph Automated 0.94 x10 3/uL      Abs Mono Automated 0.32 x10 3/uL      Abs Eos Automated 0.01 x10 3/uL      Absolute Baso Automated 0.01 x10 3/uL      Absolute Immature  Granulocyte 0.03 x10 3/uL         Radiology:  Radiology Results (24 Hour)     ** No Results found for the last 24 hours. **          Signed by: Angelique Blonder, MD   UX:[id number], Vania Rea, MD

## 2013-02-25 LAB — CBC AND DIFFERENTIAL
Basophils Absolute Automated: 0.02 10*3/uL (ref 0.00–0.20)
Basophils Automated: 0 %
Eosinophils Absolute Automated: 0.01 10*3/uL (ref 0.00–0.70)
Eosinophils Automated: 0 %
Hematocrit: 40.9 % — ABNORMAL LOW (ref 42.0–52.0)
Hgb: 13.7 g/dL (ref 13.0–17.0)
Immature Granulocytes Absolute: 0.04 10*3/uL
Immature Granulocytes: 0 %
Lymphocytes Absolute Automated: 2.16 10*3/uL (ref 0.50–4.40)
Lymphocytes Automated: 21 %
MCH: 27.7 pg — ABNORMAL LOW (ref 28.0–32.0)
MCHC: 33.5 g/dL (ref 32.0–36.0)
MCV: 82.8 fL (ref 80.0–100.0)
MPV: 10.5 fL (ref 9.4–12.3)
Monocytes Absolute Automated: 0.51 10*3/uL (ref 0.00–1.20)
Monocytes: 5 %
Neutrophils Absolute: 7.46 10*3/uL (ref 1.80–8.10)
Neutrophils: 73 %
Nucleated RBC: 0 (ref 0–1)
Platelets: 357 10*3/uL (ref 140–400)
RBC: 4.94 10*6/uL (ref 4.70–6.00)
RDW: 13 % (ref 12–15)
WBC: 10.2 10*3/uL (ref 3.50–10.80)

## 2013-02-25 LAB — GLUCOSE WHOLE BLOOD - POCT
Whole Blood Glucose POCT: 236 mg/dL — ABNORMAL HIGH (ref 70–100)
Whole Blood Glucose POCT: 240 mg/dL — ABNORMAL HIGH (ref 70–100)
Whole Blood Glucose POCT: 233 mg/dL — ABNORMAL HIGH (ref 70–100)
Whole Blood Glucose POCT: 227 mg/dL — ABNORMAL HIGH (ref 70–100)

## 2013-02-25 MED ORDER — CLONIDINE HCL 0.1 MG PO TABS
0.1000 mg | ORAL_TABLET | ORAL | Status: DC | PRN
Start: 2013-02-25 — End: 2013-02-26
  Administered 2013-02-25 – 2013-02-26 (×3): 0.1 mg via ORAL
  Filled 2013-02-25 (×3): qty 1

## 2013-02-25 NOTE — Progress Notes (Signed)
Agree as written    Liat Mayol, MD  [facility]  [phone]

## 2013-02-25 NOTE — Progress Notes (Signed)
IV inserted by ED tech using Ultrasound, 4 attempts.

## 2013-02-25 NOTE — Progress Notes (Signed)
MEDICINE PROGRESS NOTE    Date Time: 02/25/2013 2:00 PM  Patient Name: [name]  Attending Physician: Willia Craze, MD    Assessment:   Principal Problem:   *Gastroparesis  Active Problems:   GERD (gastroesophageal reflux disease)   Type 2 diabetes, uncontrolled, with neuropathy   Nausea & vomiting   Benign essential hypertension  Resolved Problems:   * No resolved hospital problems. *       38 yo male with uncontrolled diabetes and diabetic neuropathy, gastroparesis, HTN presents with nausea and vomiting likely from gastroparesis (had similar admission 1 week ago).      Plan:   #Nausea and vomiting 2/2 gastroparesis exacerbation. No signs of obstruction  - mIVF 150 cc/hr   - Reglan 10 mg IV QID standing, Erythromycin 250 mg TID standing   - Phenergan IV prn   - Continue pamelor 50 mg qhs   - Miralax daily to help promote bowel movement   - Protonix and carafate 1 g QID   dilaudid for pain    # Hypertension with tachycardia:   - Lisinopril 40mg  daily   - Amlodipine 10mg  daily   - Metoprolol 100mg  BID   - Started Clonodine      # Diabetes   - Hold home medications while NPO   - Insulin SSI (high dose) with accuchecks   - Restart lantus when patient able to tolerate PO   - Continue gabapentin for diabetic neuropathy with plan to taper as outpatient once nortriptyline reaches steady state     # FEN/GI NPO except for sips with meds, NS     # PPX: SCDs, Lovenox, GI with home PPI, miralax for GI ppx     #FULL CODE   -discussed with Dr. Theda Belfast      Safety Checklist:     DVT prophylaxis:  CHEST guideline (See page [id number]) Chemical and Mechanical   Foley:  [place] Rn Foley protocol Not present   IVs:  Peripheral IV   PT/OT: Not needed   Daily CBC & or Chem ordered:  SHM/ABIM guidelines (see #5) Yes, due to clinical and lab instability   Reference for approximate charges of common labs: CBC auto diff - $76  BMP - $99  Mg - $79    Lines:     Active PICC Line / CVC Line / PIV Line / Drain / Airway / Intraosseous  Line / Epidural Line / ART Line / Line / Wound / Pressure Ulcer / NG/OG Tube     Name   Placement date   Placement time   Site   Days    Peripheral IV 02/23/13 Left Wrist  02/23/13   2147   Wrist   1    Peripheral IV 02/25/13 Right Wrist  02/25/13   0900   Wrist   less than 1    Midline IV 02/19/13 Right;Anterior Upper Arm  02/19/13   1202   Upper Arm   6           Disposition:     Today's date: 02/25/2013  Length of Stay: 2  Anticipated medical stability for discharge: January,  20 - Afternoon  Reason for ongoing hospitalization: Abdominal pain and persistent vomiting  Anticipated discharge needs: TBD    Subjective     CC: Gastroparesis    Interval History/24 hour events: Pt admitted to medical floor. Continues to vomit and have significant abdominal pain    HPI/Subjective: Pt states he understands that the current regiments  with pain medication are not doing him good, however, he feels there is nothing else he can do. Does not take any narcotics at home.  States he wants to try different avenues for his issues but is unable to at the moment because of the debilitating condition      Physical Exam:     VITAL SIGNS PHYSICAL EXAM   Temp:  [97.9 F (36.6 C)-98.9 F (37.2 C)] 97.9 F (36.6 C)  Heart Rate:  [119-138] 137   Resp Rate:  [16-18] 18   BP: (161-191)/(89-117) 179/110 mmHg        Intake/Output Summary (Last 24 hours) at 02/25/13 1400  Last data filed at 02/25/13 1100   Gross per 24 hour   Intake     20 ml   Output      0 ml   Net     20 ml    Physical Exam  General: awake, alert X 3. Actively vomiting  Cardiovascular: regular rate and rhythm, no murmurs, rubs or gallops  Lungs: clear to auscultation bilaterally, without wheezing, rhonchi, or rales  Abdomen: soft, diffusely tender, non-distended; no palpable masses,  normoactive bowel sounds  Extremities: no edema         Meds:     Medications were reviewed:    Labs:     Labs (last 72 hours):      Lab 02/25/13 0922 02/23/13 2147   WBC 10.20 8.65   HGB 13.7  14.2   HCT 40.9* 41.6*   PLT 357 298       No results found for this basename: PT:2,INR:2,PTT:2 in the last 168 hours   Lab 02/23/13 2147 02/19/13 0652   NA 142 136   K 4.0 4.1   CL 104 102   CO2 24 26   BUN 9.0 8.0*   CREAT 1.3 1.0   CA 10.3 8.9   ALB 4.5 --   PROT 8.3 --   BILITOTAL 0.5 --   ALKPHOS 102 --   ALT 24 --   AST 16 --   GLU 311* 194*                     Imaging, reviewed and are significant for:  Abdominal Xray: no obstruction      Signed by: Barnie Alderman, MD   PGY2  [facility]   Office: [phone]   Spectra: [phone]   After hours: [phone]

## 2013-02-25 NOTE — Progress Notes (Signed)
Called and notified family practice resident on call at [phone] regarding pt blood sugar of 227 and pt is nauseated,emesis x 1,on call stated not to give insulin coverage for blood sugar.

## 2013-02-25 NOTE — Progress Notes (Signed)
Agree as written    Jakevious Hollister, MD  [facility]  [phone]

## 2013-02-25 NOTE — H&P (Signed)
Agree as written    Dessie Delcarlo, MD  [facility]  [phone]

## 2013-02-25 NOTE — Progress Notes (Signed)
Still unable to draw repeat CMP, specimen sent but hemolized

## 2013-02-25 NOTE — Progress Notes (Signed)
Attempted iv excess several , Times  No successes ,md notified.

## 2013-02-25 NOTE — Progress Notes (Signed)
I discussed the case and course of care with Dr Rabkin.  I was then able to examine the patient myself and review the history as well as performed a pertinent exam.  No changes to today's resident note.    Demeisha Geraghty, MD  [facility] Attending

## 2013-02-26 LAB — COMPREHENSIVE METABOLIC PANEL
ALT: 19 U/L (ref 0–55)
AST (SGOT): 14 U/L (ref 5–34)
Albumin/Globulin Ratio: 1.1 (ref 0.9–2.2)
Albumin: 3.9 g/dL (ref 3.5–5.0)
Alkaline Phosphatase: 73 U/L (ref 40–150)
BUN: 13 mg/dL (ref 9.0–21.0)
Bilirubin, Total: 0.8 mg/dL (ref 0.2–1.2)
CO2: 23 mEq/L (ref 22–29)
Calcium: 9.6 mg/dL (ref 8.5–10.5)
Chloride: 103 mEq/L (ref 98–107)
Creatinine: 1.1 mg/dL (ref 0.7–1.3)
Globulin: 3.4 g/dL (ref 2.0–3.6)
Glucose: 249 mg/dL — ABNORMAL HIGH (ref 70–100)
Potassium: 4.5 mEq/L (ref 3.5–5.1)
Protein, Total: 7.3 g/dL (ref 6.0–8.3)
Sodium: 136 mEq/L (ref 136–145)

## 2013-02-26 LAB — CBC AND DIFFERENTIAL
Basophils Absolute Automated: 0.03 10*3/uL (ref 0.00–0.20)
Basophils Automated: 0 %
Eosinophils Absolute Automated: 0.02 10*3/uL (ref 0.00–0.70)
Eosinophils Automated: 0 %
Hematocrit: 42.3 % (ref 42.0–52.0)
Hgb: 13.9 g/dL (ref 13.0–17.0)
Immature Granulocytes Absolute: 0.01 10*3/uL
Immature Granulocytes: 0 %
Lymphocytes Absolute Automated: 2.43 10*3/uL (ref 0.50–4.40)
Lymphocytes Automated: 26 %
MCH: 27.1 pg — ABNORMAL LOW (ref 28.0–32.0)
MCHC: 32.9 g/dL (ref 32.0–36.0)
MCV: 82.6 fL (ref 80.0–100.0)
MPV: 9.7 fL (ref 9.4–12.3)
Monocytes Absolute Automated: 0.5 10*3/uL (ref 0.00–1.20)
Monocytes: 5 %
Neutrophils Absolute: 6.39 10*3/uL (ref 1.80–8.10)
Neutrophils: 68 %
Nucleated RBC: 0 (ref 0–1)
Platelets: 309 10*3/uL (ref 140–400)
RBC: 5.12 10*6/uL (ref 4.70–6.00)
RDW: 13 % (ref 12–15)
WBC: 9.38 10*3/uL (ref 3.50–10.80)

## 2013-02-26 LAB — GLUCOSE WHOLE BLOOD - POCT
Whole Blood Glucose POCT: 224 mg/dL — ABNORMAL HIGH (ref 70–100)
Whole Blood Glucose POCT: 217 mg/dL — ABNORMAL HIGH (ref 70–100)
Whole Blood Glucose POCT: 185 mg/dL — ABNORMAL HIGH (ref 70–100)
Whole Blood Glucose POCT: 191 mg/dL — ABNORMAL HIGH (ref 70–100)

## 2013-02-26 LAB — GFR: EGFR: >60

## 2013-02-26 MED ORDER — LABETALOL HCL 5 MG/ML IV SOLN
10.0000 mg | INTRAVENOUS | Status: DC | PRN
Start: 2013-02-26 — End: 2013-02-28
  Administered 2013-02-26: 10 mg via INTRAVENOUS
  Filled 2013-02-26 (×7): qty 2

## 2013-02-26 MED ORDER — LORAZEPAM 1 MG PO TABS
1.0000 mg | ORAL_TABLET | Freq: Four times a day (QID) | ORAL | Status: DC
Start: 2013-02-26 — End: 2013-02-28
  Administered 2013-02-26 – 2013-02-28 (×8): 1 mg via ORAL
  Filled 2013-02-26 (×9): qty 1

## 2013-02-26 MED ORDER — SODIUM CHLORIDE 0.45 % IV SOLN
INTRAVENOUS | Status: DC
Start: 2013-02-26 — End: 2013-02-28

## 2013-02-26 MED ORDER — CLONIDINE HCL 0.2 MG/24HR TD PTWK
1.0000 | MEDICATED_PATCH | TRANSDERMAL | Status: DC
Start: 2013-02-26 — End: 2013-02-28
  Administered 2013-02-26: 1 via TRANSDERMAL
  Filled 2013-02-26 (×2): qty 1

## 2013-02-26 MED ORDER — HYDROMORPHONE HCL PF 1 MG/ML IJ SOLN
INTRAMUSCULAR | Status: AC
Start: 2013-02-26 — End: 2013-02-26
  Filled 2013-02-26: qty 1

## 2013-02-26 NOTE — Progress Notes (Signed)
Pt refused all AM oral meds due to feeling nauseous and vomiting.

## 2013-02-26 NOTE — Progress Notes (Signed)
MEDICINE PROGRESS NOTE    Date Time: 02/26/2013 3:42 PM  Patient Name: [name]  Attending Physician: Willia Craze, MD    Assessment:   Principal Problem:   *Gastroparesis  Active Problems:   GERD (gastroesophageal reflux disease)   Type 2 diabetes, uncontrolled, with neuropathy   Nausea & vomiting   Benign essential hypertension  Resolved Problems:   * No resolved hospital problems. *       38 yo male with uncontrolled diabetes and diabetic neuropathy, gastroparesis, HTN presents with nausea and vomiting likely from gastroparesis (had similar admission 1 week ago).      Plan:   #Nausea and vomiting 2/2 gastroparesis exacerbation. No signs of obstruction  - mIVF 150 cc/hr   - Reglan 10 mg IV QID standing, Erythromycin 250 mg TID standing   - Phenergan IV prn   - Continue pamelor 50 mg qhs   - Miralax daily to help promote bowel movement   - Protonix and carafate 1 g QID   - dilaudid for pain  -ativan prn    # Hypertension with tachycardia: persistently hypertensive in 170s-180s  - Lisinopril 40mg  daily   - Amlodipine 10mg  daily   - Metoprolol 100mg  BID   - Started Clonodine patch  -labetalol 10 mg prn for sys bp >180      # Diabetes   - Hold home medications while NPO   - Insulin SSI (high dose) with accuchecks   - Restart lantus when patient able to tolerate PO   - Continue gabapentin for diabetic neuropathy with plan to taper as outpatient once nortriptyline reaches steady state     # FEN/GI NPO except for sips with meds, NS     # PPX: SCDs, Lovenox, GI with home PPI, miralax for GI ppx     #FULL CODE   -discussed with Dr. Theda Belfast      Safety Checklist:     DVT prophylaxis:  CHEST guideline (See page e199S) Chemical and Mechanical   Foley:  [place] Rn Foley protocol Not present   IVs:  Peripheral IV   PT/OT: Not needed   Daily CBC & or Chem ordered:  SHM/ABIM guidelines (see #5) Yes, due to clinical and lab instability   Reference for approximate charges of common labs: CBC auto diff - $76  BMP -  $99  Mg - $79    Lines:     Active PICC Line / CVC Line / PIV Line / Drain / Airway / Intraosseous Line / Epidural Line / ART Line / Line / Wound / Pressure Ulcer / NG/OG Tube     Name   Placement date   Placement time   Site   Days    Peripheral IV 02/23/13 Left Wrist  02/23/13   2147   Wrist   1    Peripheral IV 02/25/13 Right Wrist  02/25/13   0900   Wrist   less than 1    Midline IV 02/19/13 Right;Anterior Upper Arm  02/19/13   1202   Upper Arm   6           Disposition:     Today's date: 02/26/2013  Length of Stay: 3  Anticipated medical stability for discharge: January,  20 - Afternoon  Reason for ongoing hospitalization: Abdominal pain and persistent vomiting  Anticipated discharge needs: TBD    Subjective     CC: Gastroparesis    Interval History/24 hour events: Pt continues to use dilaudid. Vomiting continues.  HPI/Subjective: Pt states he feels as if he is in the transition period where his vomiting has decreased but he is still having pain      Physical Exam:     VITAL SIGNS PHYSICAL EXAM   Temp:  [97.7 F (36.5 C)-99.7 F (37.6 C)] 97.7 F (36.5 C)  Heart Rate:  [104-117] 117   Resp Rate:  [16-18] 16   BP: (154-200)/(89-105) 186/100 mmHg        Intake/Output Summary (Last 24 hours) at 02/26/13 1542  Last data filed at 02/26/13 0700   Gross per 24 hour   Intake      0 ml   Output    160 ml   Net   -160 ml    Physical Exam  General: awake, alert X 3. Actively vomiting  Cardiovascular: regular rate and rhythm, no murmurs, rubs or gallops  Lungs: clear to auscultation bilaterally, without wheezing, rhonchi, or rales  Abdomen: soft, diffusely tender, non-distended; no palpable masses,  normoactive bowel sounds  Extremities: no edema           Labs:     Labs (last 72 hours):      Lab 02/26/13 0415 02/25/13 0922   WBC 9.38 10.20   HGB 13.9 13.7   HCT 42.3 40.9*   PLT 309 357       No results found for this basename: PT:2,INR:2,PTT:2 in the last 168 hours   Lab 02/26/13 0415 02/23/13 2147   NA 136 142   K  4.5 4.0   CL 103 104   CO2 23 24   BUN 13.0 9.0   CREAT 1.1 1.3   CA 9.6 10.3   ALB 3.9 4.5   PROT 7.3 8.3   BILITOTAL 0.8 0.5   ALKPHOS 73 102   ALT 19 24   AST 14 16   GLU 249* 311*                     Imaging, reviewed and are significant for:  Abdominal Xray: no obstruction      Signed by: Barnie Alderman, MD   PGY2  [facility]   Office: [phone]   Spectra: [phone]   After hours: [phone]

## 2013-02-26 NOTE — Progress Notes (Signed)
Called and notified family practice physician on call for elevated bp of 194/113,hr 110.Physician modified ordered for apresoline prn q 6 hr prn for sbp >180 or dbp> 110,given .Pt denies headache or dizziness.

## 2013-02-26 NOTE — Progress Notes (Signed)
Pt vomited x1 approx 50 ml with brown emesis and small amount of blood. MD notified.

## 2013-02-26 NOTE — Progress Notes (Signed)
Called and spoke with on call,Dr Blenda Nicely regarding pt blood sugar of 191 and pt with nausea and vomiting,dr.Glickman states not to give coverage sliding scale,informed dr.Glickman also of  pt bp 180/105 ,pt took lopressor but vomiting right  after,pt refusing the other po meds due to nausea and vomiting.No further orders received.

## 2013-02-26 NOTE — Progress Notes (Signed)
I discussed the case and course of care with Dr Rabkin.  I was then able to examine the patient myself and review the history as well as performed a pertinent exam.  No changes to today's resident note.    Nyhla Mountjoy, MD  [facility] Attending

## 2013-02-26 NOTE — Progress Notes (Signed)
BP checked approx 1 hour after labetalol. BP 191/113. MD informed. Fluids changed to 1/2 NS.

## 2013-02-27 LAB — GLUCOSE WHOLE BLOOD - POCT
Whole Blood Glucose POCT: 197 mg/dL — ABNORMAL HIGH (ref 70–100)
Whole Blood Glucose POCT: 183 mg/dL — ABNORMAL HIGH (ref 70–100)
Whole Blood Glucose POCT: 209 mg/dL — ABNORMAL HIGH (ref 70–100)
Whole Blood Glucose POCT: 213 mg/dL — ABNORMAL HIGH (ref 70–100)

## 2013-02-27 NOTE — Progress Notes (Signed)
Per pt he feels better and has not vomited since early this AM. BP remains within parameters. Pt oob, walking in halls x1. States he has yet to have a BM. Will continue to monitor. Call bell within reach.

## 2013-02-27 NOTE — Progress Notes (Signed)
I discussed the case and course of care with Dr Rabkin.  I was then able to examine the patient myself and review the history as well as performed a pertinent exam.  No changes to today's resident note.    Ensley Blas, MD  [facility] Attending

## 2013-02-27 NOTE — Progress Notes (Signed)
2 night shift techs and 1 day shift tech attempted AM lab draws, all unsuccessful. Will notify MD.

## 2013-02-27 NOTE — Progress Notes (Addendum)
Pt still nauseous. Only one episode of vomiting since 0700.

## 2013-02-27 NOTE — Progress Notes (Signed)
MEDICINE PROGRESS NOTE    Date Time: 02/27/2013 1:09 PM  Patient Name: [name]  Attending Physician: Willia Craze, MD    Assessment:   Principal Problem:   *Gastroparesis  Active Problems:   GERD (gastroesophageal reflux disease)   Type 2 diabetes, uncontrolled, with neuropathy   Nausea & vomiting   Benign essential hypertension  Resolved Problems:   * No resolved hospital problems. *       38 yo male with uncontrolled diabetes and diabetic neuropathy, gastroparesis, HTN presents with nausea and vomiting likely from gastroparesis (had similar admission 1 week ago).      Plan:   #Nausea and vomiting 2/2 gastroparesis exacerbation. Improving No signs of obstruction  - mIVF 150 cc/hr   - Reglan 10 mg IV QID standing, Erythromycin 250 mg TID standing   - Phenergan IV prn   - Continue pamelor 50 mg qhs   - Miralax daily to help promote bowel movement   - Protonix and carafate 1 g QID   - dilaudid for pain  -ativan prn  -will try to get patient to get out of bed and walk around    # Hypertension with tachycardia: persistently hypertensive in 170s-180s  -switched IVF from NS to 1/2 NS  - Lisinopril 40mg  daily   - Amlodipine 10mg  daily   - Metoprolol 100mg  BID   - Started Clonodine patch  -labetalol 10 mg prn for sys bp >180      # Diabetes   - Hold home medications while NPO   - Insulin SSI (high dose) with accuchecks   - Restart lantus when patient able to tolerate PO   - Continue gabapentin for diabetic neuropathy with plan to taper as outpatient once nortriptyline reaches steady state     # FEN/GI NPO except for sips with meds, NS     # PPX: SCDs, Lovenox, GI with home PPI, miralax for GI ppx     #FULL CODE   -discussed with Dr. Theda Belfast      Safety Checklist:     DVT prophylaxis:  CHEST guideline (See page e199S) Chemical and Mechanical   Foley:  [place] Rn Foley protocol Not present   IVs:  Peripheral IV   PT/OT: Not needed   Daily CBC & or Chem ordered:  SHM/ABIM guidelines (see #5) Yes, due to clinical  and lab instability   Reference for approximate charges of common labs: CBC auto diff - $76  BMP - $99  Mg - $79    Lines:     Active PICC Line / CVC Line / PIV Line / Drain / Airway / Intraosseous Line / Epidural Line / ART Line / Line / Wound / Pressure Ulcer / NG/OG Tube     Name   Placement date   Placement time   Site   Days    Peripheral IV 02/23/13 Left Wrist  02/23/13   2147   Wrist   1    Peripheral IV 02/25/13 Right Wrist  02/25/13   0900   Wrist   less than 1    Midline IV 02/19/13 Right;Anterior Upper Arm  02/19/13   1202   Upper Arm   6           Disposition:     Today's date: 02/27/2013  Length of Stay: 4  Anticipated medical stability for discharge: January,  20 - Afternoon  Reason for ongoing hospitalization: Abdominal pain and persistent vomiting  Anticipated discharge needs: TBD  Subjective     CC: Gastroparesis    Interval History/24 hour events: Pt continues to use dilaudid. Vomiting continues.    HPI/Subjective: Pt states he feels slightly better. His vomiting has slowed down. Wants to try and get out of bed today and walk around.      Physical Exam:     VITAL SIGNS PHYSICAL EXAM   Temp:  [98.1 F (36.7 C)-99.3 F (37.4 C)] 98.6 F (37 C)  Heart Rate:  [97-120] 97   Resp Rate:  [16] 16   BP: (145-196)/(91-117) 158/96 mmHg        Intake/Output Summary (Last 24 hours) at 02/27/13 1309  Last data filed at 02/27/13 0600   Gross per 24 hour   Intake 1657.5 ml   Output    150 ml   Net 1507.5 ml    Physical Exam  General: awake, alert X 3. Actively vomiting  Cardiovascular: regular rate and rhythm, no murmurs, rubs or gallops  Lungs: clear to auscultation bilaterally, without wheezing, rhonchi, or rales  Abdomen: soft, diffusely tender, non-distended; no palpable masses,  normoactive bowel sounds  Extremities: no edema           Labs:     Labs (last 72 hours):      Lab 02/26/13 0415 02/25/13 0922   WBC 9.38 10.20   HGB 13.9 13.7   HCT 42.3 40.9*   PLT 309 357       No results found for this  basename: PT:2,INR:2,PTT:2 in the last 168 hours   Lab 02/26/13 0415 02/23/13 2147   NA 136 142   K 4.5 4.0   CL 103 104   CO2 23 24   BUN 13.0 9.0   CREAT 1.1 1.3   CA 9.6 10.3   ALB 3.9 4.5   PROT 7.3 8.3   BILITOTAL 0.8 0.5   ALKPHOS 73 102   ALT 19 24   AST 14 16   GLU 249* 311*                     Imaging, reviewed and are significant for:  Abdominal Xray: no obstruction      Signed by: Barnie Alderman, MD   PGY2  [facility]   Office: [phone]   Spectra: [phone]   After hours: [phone]

## 2013-02-28 LAB — GLUCOSE WHOLE BLOOD - POCT
Whole Blood Glucose POCT: 159 mg/dL — ABNORMAL HIGH (ref 70–100)
Whole Blood Glucose POCT: 241 mg/dL — ABNORMAL HIGH (ref 70–100)

## 2013-02-28 MED ORDER — NORTRIPTYLINE HCL 50 MG PO CAPS
50.0000 mg | ORAL_CAPSULE | Freq: Every evening | ORAL | Status: AC
Start: 2013-02-28 — End: 2013-03-30

## 2013-02-28 MED ORDER — PROMETHAZINE HCL 12.5 MG PO TABS
12.5000 mg | ORAL_TABLET | Freq: Four times a day (QID) | ORAL | Status: AC
Start: 2013-02-28 — End: ?

## 2013-02-28 MED ORDER — POLYETHYLENE GLYCOL 3350 17 G PO PACK
17.0000 g | PACK | Freq: Two times a day (BID) | ORAL | Status: DC
Start: 2013-02-28 — End: 2013-05-01

## 2013-02-28 MED ORDER — LORAZEPAM 1 MG PO TABS
1.0000 mg | ORAL_TABLET | Freq: Four times a day (QID) | ORAL | Status: DC
Start: 2013-02-28 — End: 2013-02-28

## 2013-02-28 MED ORDER — LISINOPRIL 40 MG PO TABS
40.0000 mg | ORAL_TABLET | Freq: Every day | ORAL | Status: DC
Start: 2013-02-28 — End: 2013-05-09

## 2013-02-28 MED ORDER — METOPROLOL TARTRATE 100 MG PO TABS
100.0000 mg | ORAL_TABLET | Freq: Two times a day (BID) | ORAL | Status: AC
Start: 2013-02-28 — End: 2013-03-30

## 2013-02-28 MED ORDER — SUCRALFATE 1 G PO TABS
1.0000 g | ORAL_TABLET | Freq: Four times a day (QID) | ORAL | Status: AC
Start: 2013-02-28 — End: 2013-03-30

## 2013-02-28 MED ORDER — LISINOPRIL 40 MG PO TABS
40.0000 mg | ORAL_TABLET | Freq: Every day | ORAL | Status: AC
Start: 2013-02-28 — End: 2013-03-30

## 2013-02-28 MED ORDER — METOCLOPRAMIDE HCL 5 MG PO TABS
10.0000 mg | ORAL_TABLET | Freq: Three times a day (TID) | ORAL | Status: DC
Start: 2013-02-28 — End: 2013-05-01

## 2013-02-28 MED ORDER — AMLODIPINE BESYLATE 10 MG PO TABS
10.0000 mg | ORAL_TABLET | Freq: Every day | ORAL | Status: AC
Start: 2013-02-28 — End: 2013-03-30

## 2013-02-28 MED ORDER — PANTOPRAZOLE SODIUM 40 MG PO TBEC
40.0000 mg | DELAYED_RELEASE_TABLET | Freq: Every day | ORAL | Status: AC
Start: 2013-02-28 — End: 2013-03-30

## 2013-02-28 MED ORDER — ALPRAZOLAM 0.25 MG PO TABS
0.2500 mg | ORAL_TABLET | Freq: Four times a day (QID) | ORAL | Status: DC | PRN
Start: 2013-02-28 — End: 2013-05-01

## 2013-02-28 MED ORDER — GABAPENTIN 300 MG PO CAPS
300.0000 mg | ORAL_CAPSULE | Freq: Every evening | ORAL | Status: AC
Start: 2013-02-28 — End: 2013-03-30

## 2013-02-28 NOTE — Discharge Instructions (Signed)
Abdominal Pain  Abdominal pain is pain in the stomach or intestinal area. Everyone has this pain from time to time. In many cases it goes away on its own. But abdominal pain can sometimes be due to a serious problem, such as appendicitis. So it's important to know when to seek help.  Causes of abdominal pain  There are many possible causes of abdominal pain. Common causes in adults include:   Constipation, diarrhea, or gas   GERD (movement of stomach acid into the esophagus, also known as acid reflux or heartburn)   Peptic ulcer (a sore in the lining of the stomach or small intestine)   Inflammation of the gallbladder or pancreas   Gallstones or kidney stones   Hernia (bulging of an internal organ through a muscle or other tissue)   Urinary tract infections   In women, menstrual cramps, fibroids, or endometriosis of the uterus   Inflammation or infection of the intestines  Diagnosing the cause of abdominal pain  Your health care provider will examine you to help find the cause of your pain. If needed, tests will be ordered. Because abdominal pain has so many possible causes, it can be hard to discover the reason for the pain. Giving details about your pain can help. Be ready to tell your health care provider where and when you feel the pain and what makes it better or worse. Also mention whether you have other symptoms such as fever, tiredness, nausea, vomiting, or changes in bathroom habits.  Treating abdominal pain  Certain causes of pain, such as appendicitis or a bowel obstruction, need emergency treatment. Other problems can be treated with rest, fluids, or medications. Your health care provider can give you specific instructions for treatment or self-care based on the cause of your pain.  If you are have vomiting or diarrhea,sip water or other clear fluids. When you are ready to eat solid foods again, start with small amounts of easy-to-digest, low-fat foods, such as applesauce, toast, or crackers.    When to call the doctor  Call 911or go to the hospital right away if you:   Can't pass stool and are vomiting   Are vomiting blood or have black tarry diarrhea   Also have chest, neck, or shoulder pain   Feel like you are about to pass out   Have pain in your shoulder blades with nausea   Have sudden, excruciating abdominal pain   Have new, severepain unlike any you have felt before   Have a belly that is rigid, hard, and tender to touch  Call your doctor if you have:   Pain for more than5days   Bloating for more than2days   Diarrhea for more than5days   Feverof101For higher   Pain that continues to worsen   Unexplained weight loss   Continued lack of appetite   Blood in the stool  How to prevent abdominal pain  Here are some tips to help prevent abdominal pain:   Eat smaller amounts of food at one time.   Avoid greasy, fried, or other high-fat foods.   Avoid foods that give you gas.   Exercise regularly.   Drink plenty of fluids.  To help prevent symptoms of gastroesophageal reflux (GERD):   Quit smoking.   Lose excess weight.   Finish eating at least 2 hours before you go to bed or lie down.   Elevate the head of your bed.   2000-2014 [employer], [address], [place] [address].   All rights reserved. This information is not intended as a substitute for professional medical care. Always follow your healthcare professional's instructions.        Nausea    You have been seen for nausea.    Nausea is the feeling that you are going to vomit (throw up). Nausea is not a disease. It is a symptom of another problem. For example, nausea, vomiting and diarrhea are symptoms of a stomach virus (the "stomach flu").    You may or may not vomit when you have nausea.     The nausea itself is not dangerous but it can be very uncomfortable.    We might not be able to find out today what is causing your nausea. It is VERY IMPORTANT to see your doctor who can watch for any  serious problems.    There are many treatments for nausea. The medical staff will discuss these with you. Some common medicines used to help with nausea are   Promethazine (Phenergan), Prochlorperazine (Compazine), Metoclopramide (Reglan), Ondansetron (Zofran), and many others.    Follow a clear liquid diet. Drink water, broth, 7-Up, Sprite, or other clear caffeine-free soft drinks or sports drinks until you feel better. This might help the nausea and keep you from vomiting.     If your nausea lasts for longer than a few days, or if you have new symptoms, we STRONGLY RECOMMEND that you go to see your family doctor, specialist or clinic. If you cannot get an appointment or do not have a doctor you can always return here or go to the nearest emergency department to be seen again.    YOU SHOULD SEEK MEDICAL ATTENTION IMMEDIATELY, EITHER HERE OR AT THE NEAREST EMERGENCY DEPARTMENT, IF ANY OF THE FOLLOWING OCCURS:   You vomit often.   You have abdominal (belly) pain.   You vomit blood or anything that looks like coffee grounds.   You have a headache.   You have any new symptoms or concerns.   You feel more "unwell."      Vomiting    You have been seen for vomiting.    Vomiting (throwing-up) can be caused by many different things. Most of the time the cause IS NOT serious. The doctor feels it is safe for you to go home today.    Common causes of vomiting include the following:   Gastroenteritis (stomach flu), usually with diarrhea.   Other illnesses. Sometimes medical conditions like diabetes, heart problems, headaches, or infections can make someone throw up.    Bowel obstructions (blockages) can cause vomiting and make patients unable to have bowel movements (stool) or pass gas.   Vomiting can be a symptom of appendicitis, especially if there is also pain in the right lower abdomen (belly).    Sometimes it is hard to find out what is causing the vomiting. Vomiting can be treated with anti-nausea  medicines like Phenergan (Promethazine), Compazine (Prochlorperazine) or Zofran (Ondansetron).    Try to drink liquids to avoid dehydration. Don't drink a lot of fluid all at once. Take small sips throughout the day.    YOU SHOULD SEEK MEDICAL ATTENTION IMMEDIATELY, EITHER HERE OR AT THE NEAREST EMERGENCY DEPARTMENT, IF ANY OF THE FOLLOWING OCCURS:   You can't stop vomiting or your vomiting doesn't get better with medication.   You cannot keep liquids down.   You have severe sudden chest or belly pain after vomiting.   You have abdominal pain.      Vomiting  You have been seen for vomiting.    Vomiting (throwing-up) can be caused by many different things. Most of the time the cause IS NOT serious. The doctor feels it is safe for you to go home today.    Common causes of vomiting include the following:   Gastroenteritis (stomach flu), usually with diarrhea.   Other illnesses. Sometimes medical conditions like diabetes, heart problems, headaches, or infections can make someone throw up.    Bowel obstructions (blockages) can cause vomiting and make patients unable to have bowel movements (stool) or pass gas.   Vomiting can be a symptom of appendicitis, especially if there is also pain in the right lower abdomen (belly).    Sometimes it is hard to find out what is causing the vomiting. Vomiting can be treated with anti-nausea medicines like Phenergan (Promethazine), Compazine (Prochlorperazine) or Zofran (Ondansetron).    Try to drink liquids to avoid dehydration. Don't drink a lot of fluid all at once. Take small sips throughout the day.    YOU SHOULD SEEK MEDICAL ATTENTION IMMEDIATELY, EITHER HERE OR AT THE NEAREST EMERGENCY DEPARTMENT, IF ANY OF THE FOLLOWING OCCURS:   You can't stop vomiting or your vomiting doesn't get better with medication.   You cannot keep liquids down.   You have severe sudden chest or belly pain after vomiting.   You have abdominal pain.            To Whom It May  Concern:    Mr. Delquan Poucher has been under my care. 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    Sincerely,    Dr. Imagene Sheller, MD  [facility]

## 2013-02-28 NOTE — Plan of Care (Signed)
Problem: Pain  Goal: Patient's pain/discomfort is manageable  Outcome: Adequate for Discharge  Denies pain or nausea, tolerating full liquid diet.

## 2013-02-28 NOTE — Progress Notes (Signed)
MEDICINE PROGRESS NOTE    Date Time: 02/28/2013 2:16 PM  Patient Name: [name]  Attending Physician: Willia Craze, MD    Assessment:   Principal Problem:   *Gastroparesis  Active Problems:   GERD (gastroesophageal reflux disease)   Type 2 diabetes, uncontrolled, with neuropathy   Nausea & vomiting   Benign essential hypertension  Resolved Problems:   * No resolved hospital problems. *       38 yo male with uncontrolled diabetes and diabetic neuropathy, gastroparesis, HTN presents with nausea and vomiting likely from gastroparesis (had similar admission 1 week ago).      Plan:   #Nausea and vomiting 2/2 gastroparesis exacerbation. Improved significantly  - mIVF 150 cc/hr   - Reglan 10 mg IV QID standing, Erythromycin 250 mg TID standing   - Phenergan IV prn   - Continue pamelor 50 mg qhs   - Miralax daily to help promote bowel movement   - Protonix and carafate 1 g QID   - dilaudid for pain  -ativan prn. Will d/c with Xanax prn  -will try to get patient to get out of bed and walk around    # Hypertension with tachycardia: persistently hypertensive in 170s-180s  -switched IVF from NS to 1/2 NS  - Lisinopril 40mg  daily   - Amlodipine 10mg  daily   - Metoprolol 100mg  BID   - Started Clonodine patch  -labetalol 10 mg prn for sys bp >180      # Diabetes   - Hold home medications while NPO   - Insulin SSI (high dose) with accuchecks   - Restart lantus when patient able to tolerate PO   - Continue gabapentin for diabetic neuropathy with plan to taper as outpatient once nortriptyline reaches steady state     # FEN/GI NPO except for sips with meds, NS     # PPX: SCDs, Lovenox, GI with home PPI, miralax for GI ppx     #FULL CODE   -discussed with Dr. Theda Belfast      Safety Checklist:     DVT prophylaxis:  CHEST guideline (See page e199S) Chemical and Mechanical   Foley:  [place] Rn Foley protocol Not present   IVs:  Peripheral IV   PT/OT: Not needed   Daily CBC & or Chem ordered:  SHM/ABIM guidelines (see #5) Yes,  due to clinical and lab instability   Reference for approximate charges of common labs: CBC auto diff - $76  BMP - $99  Mg - $79    Lines:     Active PICC Line / CVC Line / PIV Line / Drain / Airway / Intraosseous Line / Epidural Line / ART Line / Line / Wound / Pressure Ulcer / NG/OG Tube     Name   Placement date   Placement time   Site   Days    Peripheral IV 02/23/13 Left Wrist  02/23/13   2147   Wrist   1    Peripheral IV 02/25/13 Right Wrist  02/25/13   0900   Wrist   less than 1    Midline IV 02/19/13 Right;Anterior Upper Arm  02/19/13   1202   Upper Arm   6             Subjective     CC: Gastroparesis    Interval History/24 hour events: Pt continues to use dilaudid. Vomiting resolved    HPI/Subjective: Pt states he feels better and is willing to go home. Discussed  in great lengths about taking measures to help prevent recurrence. Was advised again to follow with specialists that were reccommended by GI. Pt was also advised to not eat after 7pm and to start light exercise regiment.    Physical Exam:     VITAL SIGNS PHYSICAL EXAM   Temp:  [96.3 F (35.7 C)-98.8 F (37.1 C)] 96.4 F (35.8 C)  Heart Rate:  [71-91] 75   Resp Rate:  [16] 16   BP: (120-176)/(69-97) 142/84 mmHg      No intake or output data in the 24 hours ending 02/28/13 1416 Physical Exam  General: awake, alert X 3. Actively vomiting  Cardiovascular: regular rate and rhythm, no murmurs, rubs or gallops  Lungs: clear to auscultation bilaterally, without wheezing, rhonchi, or rales  Abdomen: soft, diffusely tender, non-distended; no palpable masses,  normoactive bowel sounds  Extremities: no edema           Labs:     Labs (last 72 hours):      Lab 02/26/13 0415 02/25/13 0922   WBC 9.38 10.20   HGB 13.9 13.7   HCT 42.3 40.9*   PLT 309 357       No results found for this basename: PT:2,INR:2,PTT:2 in the last 168 hours   Lab 02/26/13 0415 02/23/13 2147   NA 136 142   K 4.5 4.0   CL 103 104   CO2 23 24   BUN 13.0 9.0   CREAT 1.1 1.3   CA 9.6 10.3    ALB 3.9 4.5   PROT 7.3 8.3   BILITOTAL 0.8 0.5   ALKPHOS 73 102   ALT 19 24   AST 14 16   GLU 249* 311*              Imaging, reviewed and are significant for:  Abdominal Xray: no obstruction      Signed by: Barnie Alderman, MD   PGY2  [facility]   Office: [phone]   Spectra: 2097   After hours: [phone]

## 2013-03-15 NOTE — Discharge Summary (Signed)
MEDICINE DISCHARGE SUMMARY    Date Time: 03/15/2013 3:53 PM  Patient Name: [name]  Attending Physician: No att. providers found  Primary Care Physician: [name], MD    Date of Admission: 02/23/2013  Date of Discharge: 02/28/2013    Discharge Diagnoses:     Principal Problem:   *Gastroparesis  Active Problems:   GERD (gastroesophageal reflux disease)   Type 2 diabetes, uncontrolled, with neuropathy   Nausea & vomiting   Benign essential hypertension  Resolved Problems:   * No resolved hospital problems. *       Disposition:     home with family    Pending Results, Recommendations & Instructions to providers after discharge:     1. Micro / Labs / Path pending: none   2. Wound Care Instructions: none  3. Date of completion for antibiotics or other medications: n/a  4. Other: n/a    Recent Labs - Last 2:       No results found for this basename: WBC:2,HGB:2,HCT:2,PLT:2 in the last 168 hours    No results found for this basename: PT,INR,PTT in the last 168 hours  No results found for this basename: ALKPHOS:2,BILITOTAL:2,BILIDIRECT:2,PROT:2,ALB:2,ALT:2,AST:2 in the last 168 hours   No results found for this basename: NA:2,K:2,CL:2,CO2:2,BUN:2,CR:2,GLU:2,CA:2,MG:2,PHOS:2 in the last 168 hours    No results found for this basename: CHOL,TRIG,HDL,LDL in the last 168 hours  No results found for this basename: TSH,FREET3,FREET4 in the last 168 hours         Procedures/Radiology performed:     Xr Abdomen Ap    02/16/2013   No free air or small bowel obstruction. Improved constipation.  Kristine Linea, MD  02/16/2013 9:34 AM     Xr Abdomen Portable    02/24/2013  . No bowel obstruction  Marty Heck, MD  02/24/2013 12:24 PM        Hospital Course:     Reason for admission/ HPI:   [name] is a 38 y.o. male who presents to the hospital with uncontrolled nausea and vomiting starting at [facility] the day prior to admission. He was recently discharged from the hospital 1 week ago for similar symptoms. He was  instructed to take zofran, phenergan and reglan at home. In addition, he was started on nortriptyline. He states that the current episode was unprovoked and led to very little intake of anything po yesterday. States that he has vomiting 25-30 times since yesterday whenDenies recent narcotic use, denies marijuana or other drug use. He also adamantly denies any alcohol use prior to this admission. He endorses abdominal pain. States that his emesis is non-bloody but yellow in consistency (which is typical for him). Denies fevers, chills.   In the ED, received fluids and antiemetics as well as morphine, but patient is seeking dilaudid as he says this is the only thing that helps him.       Hospital Course:   Pt was started on his typical bowel regiment and pain management. Slowly patient improved and was switched to a clear diet. On day of discharge patient was pain free and had no vomiting for over 24 hours. Pt was sent home with referrals to specialists in [place] and [place]. It was also reccommended that pt not eat anything after 6 pm and to eat small regular meals.      Discharge Day Exam:     General: awake, alert X 3. Actively vomiting   Cardiovascular: regular rate and rhythm, no murmurs,  rubs or gallops   Lungs: clear to auscultation bilaterally, without wheezing, rhonchi, or rales   Abdomen: soft, diffusely tender, non-distended; no palpable masses, normoactive bowel sounds   Extremities: no edema        Consultations:     Treatment Team: Resident: Albina Billet, MD; Resident: Einar Gip, MD; Consulting Physician: Lestine Mount, MD; Registered Nurse: Arlyce Harman, RN; Technician: Wallene Huh B    Discharge Condition:   Hemodynamically stable      Discharge Instructions & Follow Up Plan for Patient:     Diet: low carb diet. Small regular meals    Activity/Weight Bearing Status: up as toelrated      Patient was instructed to follow up with:   Primary Care Doctor [name], MD  in  2 days & the following Consultants:  1. Minette Brine    [address] [place] [address]  [phone]    2. Stefani Dama Call    [address] [place] [address]  [phone]      Discharge Code Status:full    Patient Emergency Contact:  SIMONS,NEFFY   [phone]   Friend    Complete instructions and follow up are in the patient's After Visit Summary    Minutes spent coordinating discharge and reviewing discharge plan: 45 minutes    Discharge Medications:        Medication List       As of 03/15/2013  3:53 PM      START taking these medications           ALPRAZolam 0.25 MG tablet    Commonly known as: XANAX    Take 1 tablet (0.25 mg total) by mouth every 6 (six) hours as needed for Anxiety (abdominal pain).        * amLODIPine 10 MG tablet    Commonly known as: NORVASC    Take 1 tablet (10 mg total) by mouth daily.        * gabapentin 300 MG capsule    Commonly known as: NEURONTIN    Take 1 capsule (300 mg total) by mouth nightly.        * lisinopril 40 MG tablet    Commonly known as: PRINIVIL,ZESTRIL    Take 1 tablet (40 mg total) by mouth daily.        * nortriptyline 50 MG capsule    Commonly known as: PAMELOR    Take 1 capsule (50 mg total) by mouth nightly.        * polyethylene glycol packet    Commonly known as: MIRALAX    Take 17 g by mouth every 12 (twelve) hours.        * sucralfate 1 G tablet    Commonly known as: CARAFATE    Take 1 tablet (1 g total) by mouth 4 (four) times daily.       * Notice: This list has 6 medication(s) that are the same as other medications prescribed for you. Read the directions carefully, and ask your doctor or other care provider to review them with you.      CHANGE how you take these medications           metoprolol 100 MG tablet    Commonly known as: LOPRESSOR    Take 1 tablet (100 mg total) by mouth every 12 (twelve) hours.    What changed:  - medication strength  - dose  CONTINUE taking these medications           * amLODIPine 5 MG tablet     Commonly known as: NORVASC    Take 2 tablets (10 mg total) by mouth daily.        * gabapentin 300 MG capsule    Commonly known as: NEURONTIN        insulin glargine 100 UNIT/ML injection    Commonly known as: LANTUS    Inject 10 Units into the skin nightly. Check am fasting sugars. Increase by 1 unit every morning for readings >120. For max of 30 units        * lisinopril 40 MG tablet    Commonly known as: PRINIVIL,ZESTRIL    Take 1 tablet (40 mg total) by mouth daily.        metoclopramide 5 MG tablet    Commonly known as: REGLAN    Take 2 tablets (10 mg total) by mouth 3 (three) times daily.        * nortriptyline 50 MG capsule    Commonly known as: PAMELOR    Take 1 capsule (50 mg total) by mouth nightly.        ondansetron 4 MG tablet    Commonly known as: ZOFRAN    Take 1 tablet (4 mg total) by mouth every 6 (six) hours.        pantoprazole 40 MG tablet    Commonly known as: PROTONIX    Take 1 tablet (40 mg total) by mouth daily.        * polyethylene glycol packet    Commonly known as: MIRALAX    Take 17 g by mouth 2 (two) times daily.        * promethazine 25 MG tablet    Commonly known as: PHENERGAN    Take 1 tablet (25 mg total) by mouth every 6 (six) hours as needed.        * promethazine 12.5 MG tablet    Commonly known as: PHENERGAN    Take 1 tablet (12.5 mg total) by mouth every 6 (six) hours.        * sucralfate 1 G tablet    Commonly known as: CARAFATE    Take 1 tablet (1 g total) by mouth 4 (four) times daily.       * Notice: This list has 8 medication(s) that are the same as other medications prescribed for you. Read the directions carefully, and ask your doctor or other care provider to review them with you.      STOP taking these medications           LORazepam 1 MG tablet    Commonly known as: ATIVAN               Where to get your medications     These are the prescriptions that you need to pick up.    You may get these medications from any pharmacy.           ALPRAZolam 0.25 MG tablet     amLODIPine 10 MG tablet    gabapentin 300 MG capsule    lisinopril 40 MG tablet    metoprolol 100 MG tablet    nortriptyline 50 MG capsule    pantoprazole 40 MG tablet    polyethylene glycol packet    promethazine 12.5 MG tablet    sucralfate 1 G tablet  Information on where to get these meds is not yet available. Ask your nurse or doctor.           metoclopramide 5 MG tablet                   Signed by: Barnie Alderman, MD   PGY2  [facility]   Office: [phone]   Spectra: [phone]   After hours: [phone]       CC: [name], MD

## 2013-05-01 ENCOUNTER — Inpatient Hospital Stay: Payer: BC Managed Care – PPO | Admitting: Family Medicine

## 2013-05-01 ENCOUNTER — Emergency Department: Payer: BC Managed Care – PPO

## 2013-05-01 ENCOUNTER — Inpatient Hospital Stay
Admission: EM | Admit: 2013-05-01 | Discharge: 2013-05-09 | DRG: 074 | Disposition: A | Discharge status: Home / Self Care | Payer: BC Managed Care – PPO | Attending: Family Medicine | Admitting: Family Medicine

## 2013-05-01 DIAGNOSIS — R197 Diarrhea, unspecified: Secondary | ICD-10-CM | POA: Diagnosis present

## 2013-05-01 DIAGNOSIS — E876 Hypokalemia: Secondary | ICD-10-CM

## 2013-05-01 DIAGNOSIS — K3184 Gastroparesis: Secondary | ICD-10-CM

## 2013-05-01 DIAGNOSIS — I472 Ventricular tachycardia, unspecified: Secondary | ICD-10-CM | POA: Diagnosis not present

## 2013-05-01 DIAGNOSIS — I10 Essential (primary) hypertension: Secondary | ICD-10-CM | POA: Diagnosis present

## 2013-05-01 DIAGNOSIS — Z794 Long term (current) use of insulin: Secondary | ICD-10-CM

## 2013-05-01 DIAGNOSIS — E860 Dehydration: Secondary | ICD-10-CM | POA: Diagnosis present

## 2013-05-01 DIAGNOSIS — E1142 Type 2 diabetes mellitus with diabetic polyneuropathy: Secondary | ICD-10-CM | POA: Diagnosis present

## 2013-05-01 DIAGNOSIS — I4729 Other ventricular tachycardia: Secondary | ICD-10-CM | POA: Diagnosis not present

## 2013-05-01 DIAGNOSIS — K219 Gastro-esophageal reflux disease without esophagitis: Secondary | ICD-10-CM | POA: Diagnosis present

## 2013-05-01 DIAGNOSIS — E1149 Type 2 diabetes mellitus with other diabetic neurological complication: Principal | ICD-10-CM | POA: Diagnosis present

## 2013-05-01 DIAGNOSIS — Z9089 Acquired absence of other organs: Secondary | ICD-10-CM

## 2013-05-01 DIAGNOSIS — R112 Nausea with vomiting, unspecified: Secondary | ICD-10-CM

## 2013-05-01 DIAGNOSIS — K5289 Other specified noninfective gastroenteritis and colitis: Secondary | ICD-10-CM | POA: Diagnosis present

## 2013-05-01 LAB — CBC AND DIFFERENTIAL
Basophils Absolute Automated: 0.03 10*3/uL (ref 0.00–0.20)
Basophils Automated: 0 %
Eosinophils Absolute Automated: 0.96 10*3/uL — ABNORMAL HIGH (ref 0.00–0.70)
Eosinophils Automated: 8 %
Hematocrit: 41.5 % — ABNORMAL LOW (ref 42.0–52.0)
Hgb: 14.1 g/dL (ref 13.0–17.0)
Immature Granulocytes Absolute: 0.09 10*3/uL — ABNORMAL HIGH
Immature Granulocytes: 1 %
Lymphocytes Absolute Automated: 2.42 10*3/uL (ref 0.50–4.40)
Lymphocytes Automated: 20 %
MCH: 27.3 pg — ABNORMAL LOW (ref 28.0–32.0)
MCHC: 34 g/dL (ref 32.0–36.0)
MCV: 80.3 fL (ref 80.0–100.0)
MPV: 9.6 fL (ref 9.4–12.3)
Monocytes Absolute Automated: 0.62 10*3/uL (ref 0.00–1.20)
Monocytes: 5 %
Neutrophils Absolute: 8.09 10*3/uL (ref 1.80–8.10)
Neutrophils: 66 %
Nucleated RBC: 0 /100 WBC (ref 0–1)
Platelets: 306 10*3/uL (ref 140–400)
RBC: 5.17 10*6/uL (ref 4.70–6.00)
RDW: 13 % (ref 12–15)
WBC: 12.21 10*3/uL — ABNORMAL HIGH (ref 3.50–10.80)

## 2013-05-01 LAB — COMPREHENSIVE METABOLIC PANEL
ALT: 21 U/L (ref 0–55)
AST (SGOT): 14 U/L (ref 5–34)
Albumin/Globulin Ratio: 1.3 (ref 0.9–2.2)
Albumin: 4.2 g/dL (ref 3.5–5.0)
Alkaline Phosphatase: 112 U/L (ref 40–150)
BUN: 19 mg/dL (ref 9.0–21.0)
Bilirubin, Total: 0.4 mg/dL (ref 0.2–1.2)
CO2: 23 mEq/L (ref 22–29)
Calcium: 9.6 mg/dL (ref 8.5–10.5)
Chloride: 104 mEq/L (ref 98–107)
Creatinine: 1.8 mg/dL — ABNORMAL HIGH (ref 0.7–1.3)
Globulin: 3.2 g/dL (ref 2.0–3.6)
Glucose: 285 mg/dL — ABNORMAL HIGH (ref 70–100)
Potassium: 4.9 mEq/L (ref 3.5–5.1)
Protein, Total: 7.4 g/dL (ref 6.0–8.3)
Sodium: 138 mEq/L (ref 136–145)

## 2013-05-01 LAB — LIPASE: Lipase: 8 U/L (ref 8–78)

## 2013-05-01 LAB — GLUCOSE WHOLE BLOOD - POCT: Whole Blood Glucose POCT: 182 mg/dL — ABNORMAL HIGH (ref 70–100)

## 2013-05-01 LAB — GFR: EGFR: 51.7

## 2013-05-01 MED ORDER — MORPHINE SULFATE 4 MG/ML IJ/IV SOLN (WRAP)
4.0000 mg | Freq: Once | Status: DC
Start: 2013-05-01 — End: 2013-05-02

## 2013-05-01 MED ORDER — ONDANSETRON HCL 4 MG/2ML IJ SOLN
4.0000 mg | Freq: Once | INTRAMUSCULAR | Status: AC
Start: 2013-05-01 — End: 2013-05-01
  Administered 2013-05-01: 4 mg via INTRAVENOUS
  Filled 2013-05-01: qty 2

## 2013-05-01 MED ORDER — MORPHINE SULFATE 2 MG/ML IJ/IV SOLN (WRAP)
2.0000 mg | Status: DC | PRN
Start: 2013-05-01 — End: 2013-05-02
  Administered 2013-05-02 (×3): 2 mg via INTRAVENOUS
  Filled 2013-05-01 (×3): qty 1

## 2013-05-01 MED ORDER — SODIUM CHLORIDE 0.9 % IV BOLUS
1000.0000 mL | Freq: Once | INTRAVENOUS | Status: DC
Start: 2013-05-01 — End: 2013-05-02

## 2013-05-01 MED ORDER — PROMETHAZINE HCL 25 MG/ML IJ SOLN
12.5000 mg | Freq: Once | INTRAMUSCULAR | Status: DC
Start: 2013-05-01 — End: 2013-05-02

## 2013-05-01 MED ORDER — SODIUM CHLORIDE 0.9 % IV SOLN
INTRAVENOUS | Status: DC
Start: 2013-05-01 — End: 2013-05-09
  Administered 2013-05-05 – 2013-05-06 (×3): 1000 mL via INTRAVENOUS
  Administered 2013-05-07 – 2013-05-08 (×2): 150 mL/h via INTRAVENOUS

## 2013-05-01 MED ORDER — PROMETHAZINE HCL 25 MG/ML IJ SOLN
12.5000 mg | Freq: Once | INTRAMUSCULAR | Status: DC
Start: 2013-05-01 — End: 2013-05-01

## 2013-05-01 MED ORDER — HEPARIN SODIUM (PORCINE) 5000 UNIT/ML IJ SOLN
5000.0000 [IU] | Freq: Three times a day (TID) | INTRAMUSCULAR | Status: DC
Start: 2013-05-01 — End: 2013-05-09
  Administered 2013-05-02 – 2013-05-09 (×22): 5000 [IU] via SUBCUTANEOUS
  Filled 2013-05-01 (×23): qty 1

## 2013-05-01 MED ORDER — NALOXONE HCL 0.4 MG/ML IJ SOLN
0.2000 mg | INTRAMUSCULAR | Status: DC | PRN
Start: 2013-05-01 — End: 2013-05-09

## 2013-05-01 MED ORDER — MORPHINE SULFATE 4 MG/ML IJ/IV SOLN (WRAP)
4.0000 mg | Freq: Once | Status: AC
Start: 2013-05-01 — End: 2013-05-01
  Administered 2013-05-01: 4 mg via INTRAVENOUS
  Filled 2013-05-01: qty 1

## 2013-05-01 MED ORDER — ONDANSETRON HCL 4 MG/2ML IJ SOLN
4.0000 mg | Freq: Once | INTRAMUSCULAR | Status: DC
Start: 2013-05-01 — End: 2013-05-01

## 2013-05-01 NOTE — ED Notes (Signed)
Five days ago pt began having n/v/d, began having symptoms under control two days ago. Today at 2pm pt became having n/v/d uncontrollably. Pt hx of gastroparesis.

## 2013-05-01 NOTE — ED Provider Notes (Signed)
Physician/Midlevel provider first contact with patient: 05/01/13 2008       46M with HTN, IDDM, and gastroparesis who presents with 5 day history of watery, non-bloody diarrhea, and 2 day hx of nausea and vomiting. Has not vomited about 10 times since yesterday. Unable to maintain oral intake. Recent admission from 02/23/13 to 02/28/13 for gastroparesis. He was admitted to the [facility] practice service for pain and nausea management. No recent fevers, chills, chest pain, palpitations, cough, flu-like symptoms, dysuria, myalgias, recent travel, sick contacts or medication changes. He has chronic epigastric pain which he currently endorses, 9/10, non-radiating.  Last took phenergan po 12.5 mg about 6 hrs PTA.    History     Chief Complaint   Patient presents with   . Diarrhea   . Emesis     The history is provided by the patient.       Past Medical History   Diagnosis Date   . Diabetes    . Hypertensive disorder    . Gastroparesis        Past Surgical History   Procedure Date   . Cholecystectomy    . Knee arthroscopy w/ acl reconstruction      left   . Egd 08/08/2012     Procedure: EGD;  Surgeon: [name], MD;  Location[facility];  Service: Gastroenterology;  Laterality: N/A;  w\bx       Family History   Problem Relation Age of Onset   . Diabetes Father    . Diabetes Maternal Grandmother    . Diabetes Paternal Grandmother        Social  History   Substance Use Topics   . Smoking status: Never Smoker    . Smokeless tobacco: Never Used   . Alcohol Use: Yes      Comment: "occasional"       .     No Known Allergies    Current/Home Medications    AMLODIPINE (NORVASC) 5 MG TABLET    Take 2 tablets (10 mg total) by mouth daily.    GABAPENTIN (NEURONTIN) 300 MG CAPSULE    Take 300 mg by mouth nightly.    INSULIN GLARGINE (LANTUS) 100 UNIT/ML INJECTION    Inject 10 Units into the skin nightly. Check am fasting sugars. Increase by 1 unit every morning for readings >120. For max of 30 units    LISINOPRIL  (PRINIVIL,ZESTRIL) 40 MG TABLET    Take 1 tablet (40 mg total) by mouth daily.    PANTOPRAZOLE (PROTONIX) 40 MG TABLET    Take 40 mg by mouth daily.    PROMETHAZINE (PHENERGAN) 12.5 MG TABLET    Take 1 tablet (12.5 mg total) by mouth every 6 (six) hours.        Review of Systems   Constitutional: Positive for diaphoresis and fatigue. Negative for fever and chills.   HENT: Negative for congestion, rhinorrhea, sinus pressure and sore throat.    Eyes: Negative for visual disturbance.   Respiratory: Positive for shortness of breath. Negative for cough and wheezing.    Cardiovascular: Positive for chest pain. Negative for palpitations.   Gastrointestinal: Positive for nausea, vomiting, abdominal pain and diarrhea. Negative for blood in stool and abdominal distention.   Genitourinary: Negative for dysuria.   Musculoskeletal: Negative for back pain, joint swelling and myalgias.   Skin: Negative for rash.   Neurological: Negative for light-headedness, numbness and headaches.   All other systems reviewed and are negative.  Physical Exam    BP: 139/81 mmHg, Heart Rate: 118 , Temp: 96.9 F (36.1 C), Resp Rate: 16 , SpO2: 100 %    Physical Exam   Nursing note and vitals reviewed.  Constitutional: He is oriented to person, place, and time. He appears well-developed and well-nourished.   HENT:   Head: Normocephalic and atraumatic.   Mouth/Throat: Oropharynx is clear and moist. No oropharyngeal exudate.   Eyes: Conjunctivae normal are normal. No scleral icterus.   Neck: Neck supple.   Cardiovascular: Regular rhythm, normal heart sounds and intact distal pulses.  Exam reveals no gallop and no friction rub.    No murmur heard.       tachcyardic   Pulmonary/Chest: Effort normal and breath sounds normal. No respiratory distress. He has no wheezes. He has no rales. He exhibits no tenderness.   Abdominal: Soft. Bowel sounds are normal. He exhibits distension. There is tenderness. There is rebound.   Musculoskeletal: He exhibits  no edema.   Lymphadenopathy:     He has no cervical adenopathy.   Neurological: He is alert and oriented to person, place, and time.   Skin: Skin is warm. He is diaphoretic.   Psychiatric:        Moderate distress       MDM and ED Course     ED Medication Orders      Start     Status Ordering Provider    05/01/13 2310      Once,   Status:  Discontinued      Route: Intramuscular  Ordered Dose: 12.5 mg         Discontinued Taralynn Quiett ZAINAB    05/01/13 2136      Once,   Status:  Discontinued      Route: Intravenous  Ordered Dose: 12.5 mg         Discontinued Arlana Pouch ZAINAB    05/01/13 2107      Once,   Status:  Discontinued      Route: Intravenous  Ordered Dose: 1,000 mL         Discontinued Newell Coral    05/01/13 2107      Once,   Status:  Discontinued      Route: Intravenous  Ordered Dose: 4 mg         Discontinued Newell Coral    05/01/13 2107      Once,   Status:  Discontinued      Route: Intravenous  Ordered Dose: 4 mg         Discontinued Newell Coral    05/01/13 2032   morphine injection 4 mg   Once      Route: Intravenous  Ordered Dose: 4 mg         Last MAR action:  Given Tresea Heine ZAINAB    05/01/13 2005      Once,   Status:  Discontinued      Route: Intravenous  Ordered Dose: 1,000 mL         Discontinued LEX, CAROLYN KIMBERLY    05/01/13 2005   ondansetron (ZOFRAN) injection 4 mg   Once      Route: Intravenous  Ordered Dose: 4 mg         Last MAR action:  Given LEX, CAROLYN KIMBERLY                 MDM  Number of Diagnoses or Management Options  Diabetic gastroparesis:   Nausea &  vomiting:   Diagnosis management comments: Diagnoses considered: gastroparesis flare, viral gastroenteritis, pancreatitis, infectious/inflammartory colitis    CBC, CMP, istat lactate, UA, AXR ordered. 1L NS and zofran 4 mg ivx1, morphine 4 mg iv x1 ordered  CMP with elevated blood glucose 285. LFTs unremarkable.   Mild leukocytosis to 12.21  Lipase normal.    Initial IV failed. Normal saline not yet  given. Waiting for new IV to be placed to start IVF.     21:34 AXR resulted:  minimal bowel gas pattern - cannot be determined.     Patient admitted to [facility] family practice - inpatient for gastroparesis under Dr. Barney Drain. Discussed with Dr. Effie Shy.   Phenergan IM ordered as peripheral IV could not be placed successfully as yet.        Amount and/or Complexity of Data Reviewed  Clinical lab tests: ordered        Procedures    Clinical Impression & Disposition     Clinical Impression  Final diagnoses:   Nausea & vomiting   Diabetic gastroparesis        ED Disposition     Admit Admitting Physician: EHRLICH, THOMAS P [[id number]]  Diagnosis: Nausea vomiting and diarrhea [725481]  Estimated Length of Stay: 3 - 5 Days  Tentative Discharge Plan?: Home or Self Care [1]  Patient Class: Inpatient [101]  Update Service: Family Medicine [132]  I certify that inpatient services are medically necessary for this patient. Please see H&P and MD progress notes for additional information about the patient's course of treatment. For Medicare patients, services provided in accordance with 412.3 and expected LOS to be greater than 2 midnights for Medicare patients.: Yes             New Prescriptions    No medications on file               Adonis Brook, MD  Resident  05/02/13 [phone]

## 2013-05-01 NOTE — ED Provider Notes (Signed)
Physician/Midlevel provider first contact with patient: 05/01/13 2008         Date: 05/01/2013  Patient Name: [name]  Attending Physician: Trudie Buckler, MD      HPI: Patient is a 38 yo M which presents today with approx 5 days of non bloody diarrhea which is also assoc with nausea and vomiting with PO intake.  Symptoms are similar to last visit in the ER (This January) where he was given IVF, worked up, and admitted for intractable vomiting and gastroparesis .  No associated fever/chills, no other symptoms reported    Exam:   Constitutional: Vital signs reviewed. Appears uncomfortable  Head: NCAT  Eyes: Normal conjunctiva  ENT: MMM  Neck: Normal range of motion. Trachea midline.  Respiratory/Chest: CTAB, no respiratory distress  Cardiovascular: Tachy, RR, no M/G/R  Abdomen: Soft, diffusely TTP, ND  Upper Extremities:No edema. No cyanosis.  Lower Extremities: No edema. No cyanosis.  Neurological: A&Ox3, MAE  Skin: Warm, dry, no rash  Psychiatric: Normal affect, normal concentration    A/P: 38 y/o male with gastroparesis here with intractable N/V, abd pain.  Similar sx in past.  Likely related to gastroparesis.  Will hold on CT A/P given prior similar sx.  IVF, pain/nausea meds.  Pt ultimately required admission for symptomatic management.    Disposition:   ED Disposition     Admit Admitting Physician: EHRLICH, THOMAS P [[id number]]  Diagnosis: Nausea vomiting and diarrhea [725481]  Estimated Length of Stay: 3 - 5 Days  Tentative Discharge Plan?: Home or Self Care [1]  Patient Class: Inpatient [101]  Update Service: Family Medicine [132]  I certify that inpatient services are medically necessary for this patient. Please see H&P and MD progress notes for additional information about the patient's course of treatment. For Medicare patients, services provided in accordance with 412.3 and expected LOS to be greater than 2 midnights for Medicare patients.: Yes          Clinical Impression:   1. Nausea & vomiting     2. Diabetic gastroparesis        Diagnostic Study Results     Labs -     Results     Procedure Component Value Units Date/Time    Lipase [[id number]] Collected:05/01/13 2040    Specimen Information:Blood Updated:05/01/13 2110     Lipase 8 U/L     GFR [[id number]] Collected:05/01/13 2040     EGFR 51.7 Updated:05/01/13 2110    Comprehensive Metabolic Panel (CMP) [[id number]]  (Abnormal) Collected:05/01/13 2040    Specimen Information:Blood Updated:05/01/13 2110     Glucose 285 (H) mg/dL      BUN 96.2 mg/dL      Creatinine 1.8 (H) mg/dL      Sodium 952 mEq/L      Potassium 4.9 mEq/L      Chloride 104 mEq/L      CO2 23 mEq/L      CALCIUM 9.6 mg/dL      Protein, Total 7.4 g/dL      Albumin 4.2 g/dL      AST (SGOT) 14 U/L      ALT 21 U/L      Alkaline Phosphatase 112 U/L      Bilirubin, Total 0.4 mg/dL      Globulin 3.2 g/dL      Albumin/Globulin Ratio 1.3     CBC with Differential [[id number]]  (Abnormal) Collected:05/01/13 2040    Specimen Information:Blood / Blood Updated:05/01/13 2058     WBC 12.21 (H) x10 3/uL  RBC 5.17 x10 6/uL      Hgb 14.1 g/dL      Hematocrit 16.1 (L) %      MCV 80.3 fL      MCH 27.3 (L) pg      MCHC 34.0 g/dL      RDW 13 %      Platelets 306 x10 3/uL      MPV 9.6 fL      Neutrophils 66 %      Lymphocytes Automated 20 %      Monocytes 5 %      Eosinophils Automated 8 %      Basophils Automated 0 %      Immature Granulocyte 1 %      Nucleated RBC 0 /100 WBC      Neutrophils Absolute 8.09 x10 3/uL      Abs Lymph Automated 2.42 x10 3/uL      Abs Mono Automated 0.62 x10 3/uL      Abs Eos Automated 0.96 (H) x10 3/uL      Absolute Baso Automated 0.03 x10 3/uL      Absolute Immature Granulocyte 0.09 (H) x10 3/uL     Glucose Whole Blood - POCT [[id number]]  (Abnormal) Collected:05/01/13 2005     POCT - Glucose Whole blood 182 (H) mg/dL [id number]:82/95/62 1308          Radiologic Studies -   Radiology Results (24 Hour)     Procedure Component Value Units Date/Time    Abdomen AP [[id number]]  Collected:05/01/13 2131    Order Status:Completed  Updated:05/01/13 2138    Narrative:    HISTORY:  Emesis    COMPARISON:  02/24/2013    FINDINGS:  Supine AP views of the abdomen were obtained. Only minimal  bowel gas is present. As a result, the bowel gas pattern cannot be  assessed. Cholecystectomy clips are present. There are several small  densities present which were not seen on the prior study and represent  either overlying artifacts or bowel content. There are degenerative  changes of the hips bilaterally.      Impression:      Due to minimal bowel gas, the bowel gas pattern cannot be  determined.    Gerlene Burdock, MD   05/01/2013 9:34 PM        .    Medical Decision Making     I reviewed the vital signs, nursing notes, past medical history, past surgical history, family history and social history.  I have reviewed the patient's previous charts.  Vital Signs - BP 137/86  Pulse 117  Temp 96.4 F (35.8 C)  Resp 20  Ht 1.905 m  Wt 129.729 kg  BMI 35.75 kg/m2  SpO2 99%     Pulse Oximetry Analysis - Normal  Laboratory results reviewed and interpreted by EDP: Yes  Radiologic study results reviewed by EDP: Yes  Radiologic Studies Interpreted (viewed) by EDP: Yes    EKG Analysis reviewed and interpreted by me: Not ordered/performed during this visit.    Cardiac Monitor interpretation: Sinus tachycardia, rate in the upper 110s.  Attending Note      The patient was seen and examined by the resident. I have also personally examined and evaluated the patient. The plan of care was discussed with me. I agree with the plan as it was presented to me.  Attending and Electrical engineer    I was acting as a Neurosurgeon for Newell Coral, MD on Hosein,Devion L  A. Speidell     I am the first provider for this patient and I personally performed the services documented.   A. Speidell is scribing for me on Kritikos,Kollyn L.   This note accurately reflects work and decisions made by  me.  Newell Coral, MD                 Newell Coral, MD  05/02/13 [phone]

## 2013-05-01 NOTE — H&P (Signed)
ADMISSION HISTORY AND PHYSICAL EXAM    Date Time: 05/01/2013 10:41 PM  Patient Name: [name]  Attending Physician: Newell Coral, MD  Primary Care Physician: [name], MD    CC: Nausea, Vomiting, Diarrhea        History of Presenting Illness:   [name] is a 38 y.o. male male with a PMH significant for Gastroparesis (multiple prior admissions), uncontrolled IDDM, and HTN who presents to the hospital with intractable nausea and vomiting for 2 days and watery diarrhea for 5 days.  Reports eating [employer] prior to the onset of symptoms. Currently is having non-radiating epigastric pain with NBNB emesis. Denies having any HA, vision changes, CP, SOB, hematuria, f/c, or edema.      ED Course:  - Tachy to 118, afebrile, normotensive   - Cr 1.8, WBC 12.2k   - KUB indeterminate due to minimal bowel gas     Past Medical History:     Past Medical History   Diagnosis Date   . Diabetes    . Hypertensive disorder    . Gastroparesis        Available old records reviewed    Past Surgical History:     Past Surgical History   Procedure Date   . Cholecystectomy    . Knee arthroscopy w/ acl reconstruction      left   . Egd 08/08/2012     Procedure: EGD;  Surgeon: [name], MD;  Location: [facility];  Service: Gastroenterology;  Laterality: N/A;  w\bx       Family History:     Family History   Problem Relation Age of Onset   . Diabetes Father    . Diabetes Maternal Grandmother    . Diabetes Paternal Grandmother        Social History:     History     Social History   . Marital Status: Single     Spouse Name: N/A     Number of Children: N/A   . Years of Education: N/A     Social History Main Topics   . Smoking status: Never Smoker    . Smokeless tobacco: Never Used   . Alcohol Use: Yes      Comment: "occasional"   . Drug Use: No   . Sexually Active: Not on file     Other Topics Concern   . Not on file     Social History Narrative   . No narrative on file       Allergies:   No Known  Allergies    Medications:     Prior to Admission medications    Medication Sig Start Date End Date Taking? Authorizing Provider   pantoprazole (PROTONIX) 40 MG tablet Take 40 mg by mouth daily.   Yes [provider]   amLODIPine (NORVASC) 5 MG tablet Take 2 tablets (10 mg total) by mouth daily. 10/10/12   Noyes, Orland Jarred, MD   gabapentin (NEURONTIN) 300 MG capsule Take 300 mg by mouth nightly.    [provider]   insulin glargine (LANTUS) 100 UNIT/ML injection Inject 10 Units into the skin nightly. Check am fasting sugars. Increase by 1 unit every morning for readings >120. For max of 30 units 02/10/13   Noyes, Orland Jarred, MD   lisinopril (PRINIVIL,ZESTRIL) 40 MG tablet Take 1 tablet (40 mg total) by mouth daily. 02/28/13   Barnie Alderman, MD   promethazine (PHENERGAN) 12.5 MG tablet Take 1  tablet (12.5 mg total) by mouth every 6 (six) hours. 02/28/13   Barnie Alderman, MD   ALPRAZolam Prudy Feeler) 0.25 MG tablet Take 1 tablet (0.25 mg total) by mouth every 6 (six) hours as needed for Anxiety (abdominal pain). 02/28/13 05/01/13  Barnie Alderman, MD   metoclopramide (REGLAN) 5 MG tablet Take 2 tablets (10 mg total) by mouth 3 (three) times daily. 02/28/13 05/01/13  Barnie Alderman, MD   ondansetron (ZOFRAN) 4 MG tablet Take 1 tablet (4 mg total) by mouth every 6 (six) hours. 02/20/13 05/01/13  Erenest Rasher, MD   polyethylene glycol [facility]) packet Take 17 g by mouth 2 (two) times daily. 01/19/13 05/01/13  Barnie Alderman, MD   polyethylene glycol [facility]) packet Take 17 g by mouth every 12 (twelve) hours. 02/28/13 05/01/13  Barnie Alderman, MD   promethazine (PHENERGAN) 25 MG tablet Take 1 tablet (25 mg total) by mouth every 6 (six) hours as needed. 01/19/13 05/01/13  Barnie Alderman, MD   sucralfate (CARAFATE) 1 G tablet Take 1 tablet (1 g total) by mouth 4 (four) times daily. 10/07/12 05/01/13  Kristine Linea, MD       Review of Systems:   All other systems were reviewed and are negative except: see HPI    Physical Exam:   Patient  Vitals for the past 24 hrs:   BP Temp Pulse Resp SpO2   05/01/13 1958 139/81 mmHg 96.9 F (36.1 C) 118  16  100 %     There is no height or weight on file to calculate BMI.  No intake or output data in the 24 hours ending 05/01/13 2241    General: awake, alert, oriented x 3; in acute distress.  HEENT: perrla, eomi, sclera anicteric  oropharynx clear without lesions, mucous membranes dry  Neck: supple, no lymphadenopathy, no thyromegaly, no JVD, no carotid bruits  Cardiovascular: regular rate and rhythm, no murmurs, rubs or gallops  Lungs: clear to auscultation bilaterally, without wheezing, rhonchi, or rales  Abdomen: soft, epigastric TTP, non-distended; no palpable masses, no hepatosplenomegaly, hypoactive bowel sounds, no rebound or guarding  Extremities: no clubbing, cyanosis, or 1+ pitting edema b/l ankles  Neuro: cranial nerves grossly intact, strength 5/5 in upper and lower extremities, sensation intact,   Skin: no rashes or lesions noted        Labs:     Results     Procedure Component Value Units Date/Time    Lipase [[id number]] Collected:05/01/13 2040    Specimen Information:Blood Updated:05/01/13 2110     Lipase 8 U/L     GFR [[id number]] Collected:05/01/13 2040     EGFR 51.7 Updated:05/01/13 2110    Comprehensive Metabolic Panel (CMP) [[id number]]  (Abnormal) Collected:05/01/13 2040    Specimen Information:Blood Updated:05/01/13 2110     Glucose 285 (H) mg/dL      BUN 08.6 mg/dL      Creatinine 1.8 (H) mg/dL      Sodium 578 mEq/L      Potassium 4.9 mEq/L      Chloride 104 mEq/L      CO2 23 mEq/L      CALCIUM 9.6 mg/dL      Protein, Total 7.4 g/dL      Albumin 4.2 g/dL      AST (SGOT) 14 U/L      ALT 21 U/L      Alkaline Phosphatase 112 U/L      Bilirubin, Total 0.4 mg/dL      Globulin 3.2 g/dL  Albumin/Globulin Ratio 1.3     CBC with Differential [[id number]]  (Abnormal) Collected:05/01/13 2040    Specimen Information:Blood / Blood Updated:05/01/13 2058     WBC 12.21 (H) x10 3/uL      RBC 5.17 x10 6/uL       Hgb 14.1 g/dL      Hematocrit 52.8 (L) %      MCV 80.3 fL      MCH 27.3 (L) pg      MCHC 34.0 g/dL      RDW 13 %      Platelets 306 x10 3/uL      MPV 9.6 fL      Neutrophils 66 %      Lymphocytes Automated 20 %      Monocytes 5 %      Eosinophils Automated 8 %      Basophils Automated 0 %      Immature Granulocyte 1 %      Nucleated RBC 0 /100 WBC      Neutrophils Absolute 8.09 x10 3/uL      Abs Lymph Automated 2.42 x10 3/uL      Abs Mono Automated 0.62 x10 3/uL      Abs Eos Automated 0.96 (H) x10 3/uL      Absolute Baso Automated 0.03 x10 3/uL      Absolute Immature Granulocyte 0.09 (H) x10 3/uL     Glucose Whole Blood - POCT [[id number]]  (Abnormal) Collected:05/01/13 2005     POCT - Glucose Whole blood 182 (H) mg/dL [id number]:44/03/47 4259          Imaging personally reviewed      Assessment:     Patient Active Problem List    Diagnosis Date Noted   . Gastroparesis 02/16/2013   . Abdominal pain 02/09/2013   . Diabetic gastroparesis 08/22/2012   . Benign essential hypertension 08/05/2012   . Type 2 diabetes, uncontrolled, with neuropathy 06/27/2012   . Nausea & vomiting 06/27/2012   . GERD (gastroesophageal reflux disease) 06/17/2012   . Gastritis without bleeding 06/17/2012       [name] is a 38 y.o. male male with a PMH significant for Gastroparesis (multiple prior admissions), uncontrolled IDDM, and HTN who presents to the hospital with intractable nausea and vomiting for 2 days and watery diarrhea for 5 days.    Plan:   # Nausea and Vomiting, likely 2/2 Gastroparesis flare   - Phenergan 12.5 mg IV q6h scheduled   - Reglan 10 mg IV TID scheduled   - Erythromycin 100 IV q8h scheduled   - Carafate 1 gm PO QID when tolerating PO   - Protonix IV   - can consider Ativan q6h with Benadryl IV q6h if n/v not improved.   - will try to avoid opiates since they decrease GI motility   - IVFs   - NPO   - consult GI in the AM     # AKI, likely pre-renal in setting of dehydration   - IVFs as noted above   - will  monitor     # Diarrhea  - suspect this may be 2/2 gastroenteritis   - stool cultures   - IVFs as noted above     # IDDM, uncontrolled   - takes Lantus 36 units qhs, will restart at 22 units   - will order SSI while NPO     # HTN   - Labetalol PRN   - hold home PO meds while  NPO     Code: FULL   Diet: NPO   PPX: SCDs, SQH     Status/Rationale: Admit to observation     Case d/w Dr. Barney Drain     Signed by: Esmond Camper, MD   [id number], Vania Rea, MD

## 2013-05-02 LAB — BASIC METABOLIC PANEL
BUN: 20 mg/dL (ref 9.0–21.0)
CO2: 23 mEq/L (ref 22–29)
Calcium: 9.3 mg/dL (ref 8.5–10.5)
Chloride: 109 mEq/L — ABNORMAL HIGH (ref 98–107)
Creatinine: 1.4 mg/dL — ABNORMAL HIGH (ref 0.7–1.3)
Glucose: 186 mg/dL — ABNORMAL HIGH (ref 70–100)
Potassium: 4.1 mEq/L (ref 3.5–5.1)
Sodium: 142 mEq/L (ref 136–145)

## 2013-05-02 LAB — URINALYSIS WITH MICROSCOPIC
Bilirubin, UA: NEGATIVE
Blood, UA: NEGATIVE
Glucose, UA: >1000 — AB
Ketones UA: 10
Leukocyte Esterase, UA: NEGATIVE
Nitrite, UA: NEGATIVE
Protein, UR: 30 — AB
Specific Gravity UA: 1.023 (ref 1.001–1.035)
Urine pH: 5.5 (ref 5.0–8.0)
Urobilinogen, UA: NORMAL mg/dL

## 2013-05-02 LAB — CBC
Hematocrit: 38 % — ABNORMAL LOW (ref 42.0–52.0)
Hgb: 12.8 g/dL — ABNORMAL LOW (ref 13.0–17.0)
MCH: 27 pg — ABNORMAL LOW (ref 28.0–32.0)
MCHC: 33.7 g/dL (ref 32.0–36.0)
MCV: 80.2 fL (ref 80.0–100.0)
MPV: 9.6 fL (ref 9.4–12.3)
Nucleated RBC: 0 /100 WBC (ref 0–1)
Platelets: 166 10*3/uL (ref 140–400)
RBC: 4.74 10*6/uL (ref 4.70–6.00)
RDW: 13 % (ref 12–15)
WBC: 14.17 10*3/uL — ABNORMAL HIGH (ref 3.50–10.80)

## 2013-05-02 LAB — GFR: EGFR: >60

## 2013-05-02 LAB — GLUCOSE WHOLE BLOOD - POCT
Whole Blood Glucose POCT: 248 mg/dL — ABNORMAL HIGH (ref 70–100)
Whole Blood Glucose POCT: 190 mg/dL — ABNORMAL HIGH (ref 70–100)
Whole Blood Glucose POCT: 140 mg/dL — ABNORMAL HIGH (ref 70–100)
Whole Blood Glucose POCT: 147 mg/dL — ABNORMAL HIGH (ref 70–100)

## 2013-05-02 MED ORDER — HYDROMORPHONE HCL PF 1 MG/ML IJ SOLN
1.0000 mg | INTRAMUSCULAR | Status: DC | PRN
Start: 2013-05-02 — End: 2013-05-09
  Administered 2013-05-02 – 2013-05-09 (×24): 1 mg via INTRAVENOUS
  Filled 2013-05-02 (×24): qty 1

## 2013-05-02 MED ORDER — DEXTROSE 50 % IV SOLN
25.0000 mL | INTRAVENOUS | Status: DC | PRN
Start: 2013-05-02 — End: 2013-05-09

## 2013-05-02 MED ORDER — SODIUM CHLORIDE 0.9 % IV SOLN
100.0000 mg | Freq: Three times a day (TID) | INTRAVENOUS | Status: DC
Start: 2013-05-02 — End: 2013-05-03
  Administered 2013-05-02 – 2013-05-03 (×5): 100 mg via INTRAVENOUS
  Filled 2013-05-02 (×8): qty 100

## 2013-05-02 MED ORDER — METOCLOPRAMIDE HCL 5 MG/ML IJ SOLN
10.0000 mg | Freq: Three times a day (TID) | INTRAMUSCULAR | Status: DC
Start: 2013-05-02 — End: 2013-05-08
  Administered 2013-05-02 – 2013-05-08 (×20): 10 mg via INTRAVENOUS
  Filled 2013-05-02 (×20): qty 2

## 2013-05-02 MED ORDER — INSULIN GLARGINE 100 UNIT/ML SC SOLN
22.0000 [IU] | Freq: Every evening | SUBCUTANEOUS | Status: DC
Start: 2013-05-02 — End: 2013-05-09
  Administered 2013-05-02 – 2013-05-08 (×8): 22 [IU] via SUBCUTANEOUS
  Filled 2013-05-02: qty 22

## 2013-05-02 MED ORDER — GLUCAGON HCL (RDNA) 1 MG IJ SOLR
1.0000 mg | INTRAMUSCULAR | Status: DC | PRN
Start: 2013-05-02 — End: 2013-05-09

## 2013-05-02 MED ORDER — INSULIN ASPART 100 UNIT/ML SC SOLN
1.0000 [IU] | SUBCUTANEOUS | Status: DC | PRN
Start: 2013-05-02 — End: 2013-05-09
  Administered 2013-05-02: 2 [IU] via SUBCUTANEOUS
  Administered 2013-05-02 – 2013-05-03 (×2): 1 [IU] via SUBCUTANEOUS
  Administered 2013-05-03: 2 [IU] via SUBCUTANEOUS
  Administered 2013-05-04: 3 [IU] via SUBCUTANEOUS
  Administered 2013-05-04 – 2013-05-05 (×4): 1 [IU] via SUBCUTANEOUS
  Administered 2013-05-06 – 2013-05-09 (×5): 2 [IU] via SUBCUTANEOUS
  Filled 2013-05-02: qty 30

## 2013-05-02 MED ORDER — MORPHINE SULFATE 2 MG/ML IJ/IV SOLN (WRAP)
Status: AC
Start: 2013-05-02 — End: 2013-05-02
  Administered 2013-05-02: 2 mg via INTRAVENOUS
  Filled 2013-05-02: qty 1

## 2013-05-02 MED ORDER — PROMETHAZINE HCL 25 MG/ML IJ SOLN
INTRAMUSCULAR | Status: AC
Start: 2013-05-02 — End: 2013-05-02
  Administered 2013-05-02: 12.5 mg via INTRAVENOUS
  Filled 2013-05-02: qty 1

## 2013-05-02 MED ORDER — GLUCOSE 40 % PO GEL
15.0000 g | ORAL | Status: DC | PRN
Start: 2013-05-02 — End: 2013-05-09

## 2013-05-02 MED ORDER — LORAZEPAM 2 MG/ML IJ SOLN
1.0000 mg | INTRAMUSCULAR | Status: DC | PRN
Start: 2013-05-02 — End: 2013-05-09
  Administered 2013-05-02 – 2013-05-08 (×13): 1 mg via INTRAVENOUS
  Filled 2013-05-02 (×13): qty 1

## 2013-05-02 MED ORDER — PROMETHAZINE HCL 25 MG/ML IJ SOLN
12.5000 mg | Freq: Four times a day (QID) | INTRAMUSCULAR | Status: DC
Start: 2013-05-01 — End: 2013-05-08
  Administered 2013-05-02 – 2013-05-08 (×23): 12.5 mg via INTRAVENOUS
  Filled 2013-05-02 (×24): qty 1

## 2013-05-02 MED ORDER — PANTOPRAZOLE SODIUM 40 MG IV SOLR
40.0000 mg | Freq: Every day | INTRAVENOUS | Status: DC
Start: 2013-05-02 — End: 2013-05-02
  Administered 2013-05-02: 40 mg via INTRAVENOUS
  Filled 2013-05-02: qty 40

## 2013-05-02 MED ORDER — LABETALOL HCL 5 MG/ML IV SOLN
10.0000 mg | Freq: Four times a day (QID) | INTRAVENOUS | Status: DC | PRN
Start: 2013-05-02 — End: 2013-05-06
  Administered 2013-05-03 – 2013-05-06 (×2): 10 mg via INTRAVENOUS
  Filled 2013-05-02 (×5): qty 2

## 2013-05-02 NOTE — Progress Notes (Signed)
PROGRESS NOTE    Date Time: 05/02/2013 10:00 AM  Patient Name: [name],[name]      Assessment:     Patient Active Problem List     Diagnosis  Date Noted    .  Gastroparesis  02/16/2013    .  Abdominal pain  02/09/2013    .  Diabetic gastroparesis  08/22/2012    .  Benign essential hypertension  08/05/2012    .  Type 2 diabetes, uncontrolled, with neuropathy  06/27/2012    .  Nausea & vomiting  06/27/2012    .  GERD (gastroesophageal reflux disease)  06/17/2012    .  Gastritis without bleeding  06/17/2012      38 y.o. male male with a PMH significant for Gastroparesis (multiple prior admissions), uncontrolled IDDM, and HTN who presents to the hospital with intractable nausea and vomiting for 2 days and watery diarrhea for 5 days.      Plan:   1). Nausea and Vomiting, likely 2/2 Gastroparesis flare   - Phenergan 12.5 mg IV q6h scheduled   - Reglan 10 mg IV TID scheduled   - Erythromycin 100 IV q8h scheduled   - Carafate 1 gm PO QID when tolerating PO   - discontinue Protonix IV as probably not helping   - Add ativan 1 mg IV prn and dilaudid 1 mg q4hrs prn pain   - IVFs; will insert PICC line today  - NPO       2). AKI, likely pre-renal in setting of dehydration   - IVFs as noted above   - will monitor BMP    3). Diarrhea   - suspect this may be 2/2 gastroenteritis   - stool cultures   - IVFs as noted above     4). IDDM, uncontrolled   - continue 22 units lantus   - will order SSI while NPO     5). HTN   - Labetalol PRN   - hold home PO meds while NPO     Code: FULL   Diet: NPO   PPX: SCDs, SQH     Dispo: Continue observation       Subjective:   Patient continue to feel abdominal pain.  Small volume vomit x 2 non bilious while in interview    Medications:     Current Facility-Administered Medications   Medication Dose Route Frequency   . erythromycin  100 mg Intravenous Q8H   . heparin (porcine)  5,000 Units Subcutaneous Q8H SCH   . insulin glargine  22 Units Subcutaneous QHS   . metoclopramide  10 mg Intravenous TID    . [COMPLETED] morphine  4 mg Intravenous Once   . [COMPLETED] ondansetron  4 mg Intravenous Once   . promethazine  12.5 mg Intravenous Q6H   . [DISCONTINUED] morphine  4 mg Intravenous Once   . [DISCONTINUED] ondansetron  4 mg Intravenous Once   . [DISCONTINUED] pantoprazole  40 mg Intravenous Daily   . [DISCONTINUED] promethazine  12.5 mg Intravenous Once   . [DISCONTINUED] promethazine  12.5 mg Intramuscular Once   . [DISCONTINUED] sodium chloride  1,000 mL Intravenous Once   . [DISCONTINUED] sodium chloride  1,000 mL Intravenous Once       Physical Exam:     Filed Vitals:    05/02/13 0730   BP: 131/62   Pulse: 116   Temp: 96.6 F (35.9 C)   Resp: 16   SpO2: 98%       Intake and  Output Summary (Last 24 hours) at Date Time    Intake/Output Summary (Last 24 hours) at 05/02/13 1000  Last data filed at 05/02/13 0600   Gross per 24 hour   Intake      0 ml   Output    657 ml   Net   -657 ml     General: awake, alert, oriented x 3; appears sleepy.   HEENT: perrla, eomi, sclera anicteric oropharynx clear without lesions  Neck: supple, no lymphadenopathy, no thyromegaly, no JVD, no carotid bruits   Cardiovascular: regular rate and rhythm, no murmurs, rubs or gallops   Lungs: clear to auscultation bilaterally, without wheezing, rhonchi, or rales   Abdomen: soft, no TTP, non-distended; no palpable masses, no hepatosplenomegaly, hypoactive bowel sounds, no rebound or guarding   Extremities: no clubbing, cyanosis, 1+ pitting edema b/[name] ankles   Neuro: cranial nerves grossly intact, strength 5/5 in upper and lower extremities, sensation intact,   Skin: no rashes or lesions noted        Labs:     Results     Procedure Component Value Units Date/Time    UA with Micro [[id number]]  (Abnormal) Collected:05/02/13 0938    Specimen Information:Urine Updated:05/02/13 0957     Urine Type Clean Catch      Color, UA Yellow      Clarity, UA Clear      Specific Gravity UA 1.023      Urine pH 5.5      Leukocyte Esterase, UA Negative       Nitrite, UA Negative      Protein, UR 30 (A)      Glucose, UA >1000 (A)      Ketones UA 10      Urobilinogen, UA Normal mg/dL      Bilirubin, UA Negative      Blood, UA Negative     Glucose Whole Blood - POCT [[id number]]  (Abnormal) Collected:05/02/13 0805     POCT - Glucose Whole blood 248 (H) mg/dL [id number]:28/41/32 4401    Lipase [[id number]] Collected:05/01/13 2040    Specimen Information:Blood Updated:05/01/13 2110     Lipase 8 U/[name]     GFR [[id number]] Collected:05/01/13 2040     EGFR 51.7 Updated:05/01/13 2110    Comprehensive Metabolic Panel (CMP) [[id number]]  (Abnormal) Collected:05/01/13 2040    Specimen Information:Blood Updated:05/01/13 2110     Glucose 285 (H) mg/dL      BUN 32.9 mg/dL      Creatinine 1.8 (H) mg/dL      Sodium 518 mEq/[name]      Potassium 4.9 mEq/[name]      Chloride 104 mEq/[name]      CO2 23 mEq/[name]      CALCIUM 9.6 mg/dL      Protein, Total 7.4 g/dL      Albumin 4.2 g/dL      AST (SGOT) 14 U/[name]      ALT 21 U/[name]      Alkaline Phosphatase 112 U/[name]      Bilirubin, Total 0.4 mg/dL      Globulin 3.2 g/dL      Albumin/Globulin Ratio 1.3     CBC with Differential [[id number]]  (Abnormal) Collected:05/01/13 2040    Specimen Information:Blood / Blood Updated:05/01/13 2058     WBC 12.21 (H) x10 3/uL      RBC 5.17 x10 6/uL      Hgb 14.1 g/dL      Hematocrit 16.0 ([name]) %      MCV 80.3  fL      MCH 27.3 ([name]) pg      MCHC 34.0 g/dL      RDW 13 %      Platelets 306 x10 3/uL      MPV 9.6 fL      Neutrophils 66 %      Lymphocytes Automated 20 %      Monocytes 5 %      Eosinophils Automated 8 %      Basophils Automated 0 %      Immature Granulocyte 1 %      Nucleated RBC 0 /100 WBC      Neutrophils Absolute 8.09 x10 3/uL      Abs Lymph Automated 2.42 x10 3/uL      Abs Mono Automated 0.62 x10 3/uL      Abs Eos Automated 0.96 (H) x10 3/uL      Absolute Baso Automated 0.03 x10 3/uL      Absolute Immature Granulocyte 0.09 (H) x10 3/uL     Glucose Whole Blood - POCT [[id number]]  (Abnormal) Collected:05/01/13 2005     POCT - Glucose  Whole blood 182 (H) mg/dL [id number]:14/78/29 5621        Rads:   Radiological Procedure reviewed.    Signed by: Erenest Rasher

## 2013-05-02 NOTE — Plan of Care (Addendum)
Pt lethargic, open eyes to voice and answering questions, but get back to sleep after dose Ativan. Pt getting PICC, will send lab after PICC line placement verified.    Pt awake around 1400. Scheduled phenergan given. No BM today. Will send stool when pt make BM

## 2013-05-02 NOTE — Procedures (Addendum)
Peripherally Inserted Central Catheter (PICC) PROCEDURE NOTE    Forker,Arin L  05/02/2013    INDICATIONS: Compromised venous status    CONSENT: The procedure, risks, benefits and alternatives were discussed with the patient.  All questions were answered, and informed written consent was obtained from the patient.  Informed consent and Procedure Boarding Pass were completed.     PROCEDURE DETAILS:      Ultrasound was used to confirm patency of the Right Basilic vein prior to obtaining venous access. The area to be punctured was  cleansed with chlorhexidine 2% for more than 30 seconds and the site draped using maximal sterile barrier precautions.    After 1% lidocaine injected followed by puncture of the Right Basilic with a 21-gauge single-wall needle under direct sonographic guidance. Guidewire was advanced and the needle was removed and a peel-away sheath was placed. The catheter was then trimmed and advanced through the peel-away sheath using electronic navigation.  The sheath was removed, brisk blood return confirmed, the catheter was flushed with normal saline and capped.      Maximal sterile barrier was used: cap, mask, sterile gloves, sterile gown and body drape.  All guide wires were removed with tip intact by visual inspection.  The catheter was stabilized on the skin using a securement device.  Antimicrobial disc and sterile transparent occlusive dressing applied using aseptic technique.     Patient did tolerate procedure well.        Catheter Type: Power picc 59f Double lumen  Insertion Site (vein): Right Basilic  Total Length: 48 cm  Internal Length: 48 cm  External Length: 0 cm  UAC: 43 cm    PICC Reference #: [id number]  PICC Kit Lot #: [id number]  PICC Kit expiration date: 2016-06    FINDINGS/CONCLUSIONS:   No signs of bleeding or symptoms of nerve irritation noted.  Final tip location was confirmed utilizing ECG tip confirmation.  PICC is ready for use  PICC education / instructions provided to  patient.  Harrington Jobe Perlie Gold, RN

## 2013-05-02 NOTE — Plan of Care (Signed)
Telemetry ST 110-130, pt pain on abdomen 4-8/10, 2mg  of IV morphine given as needed. vomited green bile in the morning one hour after scheduled phenergan and reglan. Let MD know about need of PICC for AM lab, IVF, antibiotics. Will continue to monitor.

## 2013-05-02 NOTE — Progress Notes (Signed)
Per ED nurse Angie, another ed nurse gave pt morphine & phenergan immediately before coming up.

## 2013-05-02 NOTE — Progress Notes (Signed)
Pt arrived from from ED;  SinusTach on tele HR 110-120s. Complaining of abdominal pain 8/10, better with IV morphine. Vomiting green bile x6 overnight despite scheduled medication. No BM, Urine flushed accidentally before we could collect urine. Will try for both specimens during day shift. Pt resting in bed.    IV was infiltrated and took 2 and a half hrs to place a new one. Pt is a very difficult stick, a doctor came from another floor and eventually placed a small 22g. Pt needs a PICC with all the  IVF & antibiotics.

## 2013-05-02 NOTE — Progress Notes (Signed)
Attending Progress Note    A/P: I agree with the exam, assessment and plan as per Dr. Neale Burly this AM.   Pt with vomiting and abdominal pain.    Agree with motility agents and Dilaudid for pain.    Hydration.  Monitor in hospital.  Gastroparesis has been a long standing problem.   Will try and set up with a gastroparesis center as an out pt.   DM in uncontrolled but adequate today.   HTN is stable.       S:Pt seen and examined concurrently today with Dr. Neale Burly.  Chart reviewed.  Case discussed in detail.  Pt complains of episodic abdominal pain and vomiting of clear to yellow liquid this AM.   No chest pain or SOB this morning.   Unable to eat.      Patient Active Problem List   Diagnosis   . GERD (gastroesophageal reflux disease)   . Gastritis without bleeding   . Type 2 diabetes, uncontrolled, with neuropathy   . Nausea & vomiting   . Benign essential hypertension   . Diabetic gastroparesis   . Abdominal pain   . Gastroparesis   . Nausea vomiting and diarrhea         Current Facility-Administered Medications   Medication Dose Route Frequency Last Rate Last Dose   . 0.9%  NaCl infusion   Intravenous Continuous 150 mL/hr at 05/02/13 0153     . dextrose (GLUCOSE) 40 % oral gel 6 g of glucose  15 g Oral PRN       . dextrose 50 % bolus 25 mL  25 mL Intravenous PRN       . erythromycin (ERYTHROCIN) 100 mg in sodium chloride 0.9 % 250 mL IVPB  100 mg Intravenous Q8H 250 mL/hr at 05/02/13 0416 100 mg at 05/02/13 0416   . glucagon (rDNA) (GLUCAGEN) injection 1 mg  1 mg Intramuscular PRN       . heparin (porcine) injection 5,000 Units  5,000 Units Subcutaneous Q8H SCH   5,000 Units at 05/02/13 0345   . HYDROmorphone (DILAUDID) injection 1 mg  1 mg Intravenous Q4H PRN       . insulin aspart (NovoLOG) injection 1-5 Units  1-5 Units Subcutaneous PRN   2 Units at 05/02/13 0926   . insulin glargine (LANTUS) injection 22 Units  22 Units Subcutaneous QHS   22 Units at 05/02/13 0150   . labetalol (NORMODYNE,TRANDATE) injection 10  mg  10 mg Intravenous Q6H PRN       . LORazepam (ATIVAN) injection 1 mg  1 mg Intravenous Q4H PRN   1 mg at 05/02/13 0925   . metoclopramide (REGLAN) injection 10 mg  10 mg Intravenous TID   10 mg at 05/02/13 0924   . [COMPLETED] morphine injection 4 mg  4 mg Intravenous Once   4 mg at 05/01/13 2049   . naloxone [facility]) injection 0.2 mg  0.2 mg Intravenous PRN       . [COMPLETED] ondansetron (ZOFRAN) injection 4 mg  4 mg Intravenous Once   4 mg at 05/01/13 2039   . promethazine (PHENERGAN) injection 12.5 mg  12.5 mg Intravenous Q6H   12.5 mg at 05/02/13 0534   . [DISCONTINUED] morphine injection 2 mg  2 mg Intravenous Q2H PRN   2 mg at 05/02/13 0803   . [DISCONTINUED] morphine injection 4 mg  4 mg Intravenous Once       . [DISCONTINUED] ondansetron (ZOFRAN) injection 4 mg  4 mg  Intravenous Once       . [DISCONTINUED] pantoprazole (PROTONIX) injection 40 mg  40 mg Intravenous Daily   40 mg at 05/02/13 0155   . [DISCONTINUED] promethazine (PHENERGAN) injection 12.5 mg  12.5 mg Intravenous Once       . [DISCONTINUED] promethazine (PHENERGAN) injection 12.5 mg  12.5 mg Intramuscular Once       . [DISCONTINUED] sodium chloride 0.9 % bolus 1,000 mL  1,000 mL Intravenous Once       . [DISCONTINUED] sodium chloride 0.9 % bolus 1,000 mL  1,000 mL Intravenous Once           O: Patient is alert and non-toxic.   Heart: Regular   Lungs: Clear   Abdomen: Soft and NT.  No HSM   Ext: No edema   Mental status: Alert         Filed Vitals:    05/02/13 0730   BP: 131/62   Pulse: 116   Temp: 96.6 F (35.9 C)   Resp: 16   SpO2: 98%         Intake/Output Summary (Last 24 hours) at 05/02/13 1001  Last data filed at 05/02/13 0600   Gross per 24 hour   Intake      0 ml   Output    657 ml   Net   -657 ml          Results     Procedure Component Value Units Date/Time    UA with Micro [[id number]]  (Abnormal) Collected:05/02/13 0938    Specimen Information:Urine Updated:05/02/13 0957     Urine Type Clean Catch      Color, UA Yellow       Clarity, UA Clear      Specific Gravity UA 1.023      Urine pH 5.5      Leukocyte Esterase, UA Negative      Nitrite, UA Negative      Protein, UR 30 (A)      Glucose, UA >1000 (A)      Ketones UA 10      Urobilinogen, UA Normal mg/dL      Bilirubin, UA Negative      Blood, UA Negative     Glucose Whole Blood - POCT [[id number]]  (Abnormal) Collected:05/02/13 0805     POCT - Glucose Whole blood 248 (H) mg/dL NWGNFAO:13/08/65 7846    Lipase [[id number]] Collected:05/01/13 2040    Specimen Information:Blood Updated:05/01/13 2110     Lipase 8 U/L     GFR [[id number]] Collected:05/01/13 2040     EGFR 51.7 Updated:05/01/13 2110    Comprehensive Metabolic Panel (CMP) [[id number]]  (Abnormal) Collected:05/01/13 2040    Specimen Information:Blood Updated:05/01/13 2110     Glucose 285 (H) mg/dL      BUN 47.4 mg/dL      Creatinine 1.8 (H) mg/dL      Sodium 259 mEq/L      Potassium 4.9 mEq/L      Chloride 104 mEq/L      CO2 23 mEq/L      CALCIUM 9.6 mg/dL      Protein, Total 7.4 g/dL      Albumin 4.2 g/dL      AST (SGOT) 14 U/L      ALT 21 U/L      Alkaline Phosphatase 112 U/L      Bilirubin, Total 0.4 mg/dL      Globulin 3.2 g/dL      Albumin/Globulin Ratio 1.3  CBC with Differential [[id number]]  (Abnormal) Collected:05/01/13 2040    Specimen Information:Blood / Blood Updated:05/01/13 2058     WBC 12.21 (H) x10 3/uL      RBC 5.17 x10 6/uL      Hgb 14.1 g/dL      Hematocrit 65.7 (L) %      MCV 80.3 fL      MCH 27.3 (L) pg      MCHC 34.0 g/dL      RDW 13 %      Platelets 306 x10 3/uL      MPV 9.6 fL      Neutrophils 66 %      Lymphocytes Automated 20 %      Monocytes 5 %      Eosinophils Automated 8 %      Basophils Automated 0 %      Immature Granulocyte 1 %      Nucleated RBC 0 /100 WBC      Neutrophils Absolute 8.09 x10 3/uL      Abs Lymph Automated 2.42 x10 3/uL      Abs Mono Automated 0.62 x10 3/uL      Abs Eos Automated 0.96 (H) x10 3/uL      Absolute Baso Automated 0.03 x10 3/uL      Absolute Immature Granulocyte 0.09 (H)  x10 3/uL     Glucose Whole Blood - POCT [[id number]]  (Abnormal) Collected:05/01/13 2005     POCT - Glucose Whole blood 182 (H) mg/dL [id number]:02/13/70 5366          Radiology Results (24 Hour)     Procedure Component Value Units Date/Time    Abdomen AP [[id number]] Collected:05/01/13 2131    Order Status:Completed  Updated:05/01/13 2138    Narrative:    HISTORY:  Emesis    COMPARISON:  02/24/2013    FINDINGS:  Supine AP views of the abdomen were obtained. Only minimal  bowel gas is present. As a result, the bowel gas pattern cannot be  assessed. Cholecystectomy clips are present. There are several small  densities present which were not seen on the prior study and represent  either overlying artifacts or bowel content. There are degenerative  changes of the hips bilaterally.      Impression:      Due to minimal bowel gas, the bowel gas pattern cannot be  determined.    Gerlene Burdock, MD   05/01/2013 9:34 PM                  Ashok Cordia M.D. [address] [facility]  [phone]

## 2013-05-03 ENCOUNTER — Inpatient Hospital Stay: Payer: BC Managed Care – PPO

## 2013-05-03 LAB — MAGNESIUM: Magnesium: 1.8 mg/dL (ref 1.6–2.6)

## 2013-05-03 LAB — GLUCOSE WHOLE BLOOD - POCT
Whole Blood Glucose POCT: 152 mg/dL — ABNORMAL HIGH (ref 70–100)
Whole Blood Glucose POCT: 153 mg/dL — ABNORMAL HIGH (ref 70–100)
Whole Blood Glucose POCT: 189 mg/dL — ABNORMAL HIGH (ref 70–100)
Whole Blood Glucose POCT: 216 mg/dL — ABNORMAL HIGH (ref 70–100)

## 2013-05-03 LAB — CBC
Hematocrit: 38.2 % — ABNORMAL LOW (ref 42.0–52.0)
Hgb: 12.8 g/dL — ABNORMAL LOW (ref 13.0–17.0)
MCH: 27.1 pg — ABNORMAL LOW (ref 28.0–32.0)
MCHC: 33.5 g/dL (ref 32.0–36.0)
MCV: 80.9 fL (ref 80.0–100.0)
MPV: 9.6 fL (ref 9.4–12.3)
Nucleated RBC: 0 /100 WBC (ref 0–1)
Platelets: 289 10*3/uL (ref 140–400)
RBC: 4.72 10*6/uL (ref 4.70–6.00)
RDW: 13 % (ref 12–15)
WBC: 14.87 10*3/uL — ABNORMAL HIGH (ref 3.50–10.80)

## 2013-05-03 LAB — BASIC METABOLIC PANEL
BUN: 18 mg/dL (ref 9.0–21.0)
CO2: 24 mEq/L (ref 22–29)
Calcium: 9.1 mg/dL (ref 8.5–10.5)
Chloride: 110 mEq/L — ABNORMAL HIGH (ref 98–107)
Creatinine: 1.3 mg/dL (ref 0.7–1.3)
Glucose: 179 mg/dL — ABNORMAL HIGH (ref 70–100)
Potassium: 4.4 mEq/L (ref 3.5–5.1)
Sodium: 142 mEq/L (ref 136–145)

## 2013-05-03 LAB — PHOSPHORUS: Phosphorus: 3.1 mg/dL (ref 2.3–4.7)

## 2013-05-03 LAB — TROPONIN I
Troponin I: 0.02 ng/mL (ref 0.00–0.09)
Troponin I: 0.04 ng/mL (ref 0.00–0.09)

## 2013-05-03 LAB — GFR: EGFR: >60

## 2013-05-03 MED ORDER — BENZONATATE 100 MG PO CAPS
100.0000 mg | ORAL_CAPSULE | Freq: Three times a day (TID) | ORAL | Status: DC | PRN
Start: 2013-05-04 — End: 2013-05-09
  Filled 2013-05-03 (×4): qty 1

## 2013-05-03 NOTE — Plan of Care (Signed)
ST on tele HR 110-120s. Pt is A&O. Sleepy, but easy to wake up and holds a conversation. Complaint of abdominal pain, iv dilaudid given with relief. Vomited green bile x2 on shift. IV medications given as scheduled. PICC in right arm infusing NS at 150 ml/hr. Dressing c,d&i.  Antibiotic given as ordered. No bm on shift, will still continue to collect stool sample.

## 2013-05-03 NOTE — Plan of Care (Signed)
Pt is alert and oriented and ambulates independently.  Pt drowsy throughout shift and continues to have nausea and vomiting of green bile looking fluid.  Pt receives scheduled IVP anti-emetics as well as PRN IVP dilaudid for pain.  Pt had U/S of abdomen this shift with the results as follows:   IMPRESSION:   1. Increased liver echogenicity consistent with steatosis.   2. Normal liver size. There is a focus of hyperechogenicity within the   right lobe of the liver. Though this may represent a focus of fatty   infiltration, a hemangioma cannot be excluded. This finding should be   confirmed or further delineated with an MRI of the abdomen.   3. The gallbladder has been removed.   4. There is no hydronephrosis within the right kidney    Telemetry tracing sinus tachycardia.  Pt with high BP this shift, SBP > 180.  Pt given 10 mg of labetalol PRN IV.

## 2013-05-03 NOTE — Consults (Addendum)
[place] [facility] CARDIOLOGY CONSULTATION REPORT  [facility]    Date Time: 05/03/2013 11:13 AM  Patient Name: [name]  Requesting Physician: Ashok Cordia, MD       Reason for Consultation:   Ventricular tachycardia    History:   [name] is a 38 y.o. male with insulin-dependent DM, hypertension, and gastroparesis admitted on 05/01/2013, for whom we are asked to provide cardiac consultation, regarding ventricular tachycardia.  The pt was admitted on 3/20 with 2 days of intractable nausea and vomiting and 5 days of watery diarrhea, diagnosed presumptively with possible gastroenteritis and a gastroparesis flare. He has been getting IVF for dehydration and prerenal azotemia, as well as phenergan and reglan for his gastroparesis/nausea.  Last night he also had a PICC line placed.  In that setting, he developed an approximately 50-beat run of ventricular tachycardia last night associated with palpitations but no dizziness or syncope.  He recalls having intermittent palpitations in the past, but nearly always when he has a gastroparesis flare.  He denies any prior history of syncope or dizziness.      He denies CP but does get exertional dyspnea fairly easily with exertion.     Past Medical History:     Past Medical History   Diagnosis Date   . Diabetes    . Hypertensive disorder    . Gastroparesis        Past Surgical History:     Past Surgical History   Procedure Date   . Cholecystectomy    . Knee arthroscopy w/ acl reconstruction      left   . Egd 08/08/2012     Procedure: EGD;  Surgeon: [name], MD;  Location[facility];  Service: Gastroenterology;  Laterality: N/A;  w\bx       Family History:     Family History   Problem Relation Age of Onset   . Diabetes Father    . Diabetes Maternal Grandmother    . Diabetes Paternal Grandmother        Social History:     History     Social History   . Marital Status: Single     Spouse Name: N/A     Number of Children: N/A   . Years of  Education: N/A     Social History Main Topics   . Smoking status: Never Smoker    . Smokeless tobacco: Never Used   . Alcohol Use: Yes      Comment: "occasional"   . Drug Use: No   . Sexually Active: Not on file     Other Topics Concern   . Not on file     Social History Narrative   . No narrative on file       Allergies:   No Known Allergies    Medications:     Prescriptions prior to admission   Medication Sig   . metoprolol XL (TOPROL-XL) 50 MG 24 hr tablet Take 50 mg by mouth daily.   . pantoprazole (PROTONIX) 40 MG tablet Take 40 mg by mouth daily.   Marland Kitchen amLODIPine (NORVASC) 5 MG tablet Take 2 tablets (10 mg total) by mouth daily.   Marland Kitchen gabapentin (NEURONTIN) 300 MG capsule Take 300 mg by mouth nightly.   . insulin glargine (LANTUS) 100 UNIT/ML injection Inject 36 Units into the skin nightly. Check am fasting sugars. Increase by 1 unit every morning for readings >120. For max of 30 units   . lisinopril (  PRINIVIL,ZESTRIL) 40 MG tablet Take 1 tablet (40 mg total) by mouth daily.   . promethazine (PHENERGAN) 12.5 MG tablet Take 1 tablet (12.5 mg total) by mouth every 6 (six) hours.   . [DISCONTINUED] insulin glargine (LANTUS) 100 UNIT/ML injection Inject 10 Units into the skin nightly. Check am fasting sugars. Increase by 1 unit every morning for readings >120. For max of 30 units      Current Facility-Administered Medications   Medication Dose Route Frequency   . erythromycin  100 mg Intravenous Q8H   . heparin (porcine)  5,000 Units Subcutaneous Q8H SCH   . insulin glargine  22 Units Subcutaneous QHS   . metoclopramide  10 mg Intravenous TID   . promethazine  12.5 mg Intravenous Q6H         Review of Systems:    Comprehensive review of systems including constitutional, eyes, ears, nose, mouth, throat, cardiovascular, GI, GU, musculoskeletal, integumentary, respiratory, neurologic, psychiatric, and endocrine is negative other than what is mentioned already in the history of present illness    Physical Exam:     VITAL  SIGNS PHYSICAL EXAM   Filed Vitals:    05/03/13 0651   BP: 118/76   Pulse: 120   Temp: 98.2 F (36.8 C)   Resp: 18   SpO2: 95%     Temp (24hrs), Avg:97.6 F (36.4 C), Min:96.1 F (35.6 C), Max:98.2 F (36.8 C)      Intake and Output Summary (Last 24 hours) at Date Time    Intake/Output Summary (Last 24 hours) at 05/03/13 1113  Last data filed at 05/03/13 0100   Gross per 24 hour   Intake   2450 ml   Output   1205 ml   Net   1245 ml       Telemetry: no changes Physical Exam  General: awake, alert, breathing comfortable, no acute distress  Head: normocephalic  Eyes: EOM's intact  Cardiovascular: regular rate and rhythm, S1, S2, no S3, no S4, no murmurs, rubs or gallops  Neck: no carotid bruits or JVD  Lungs: clear to auscultation bilateraly, without wheezing, rhonchi, or rales  Abdomen: soft, non-tender, non-distended; no palpable masses,  normoactive bowel sounds  Extremities: no edema  Pulse: equal pulses, 4/4 symmetric  Neurological: Alert and oriented X3, mood and affect normal     Labs Reviewed:   No results found for this basename: CK:3,TROPI:3,TROPT:3,CKMBINDEX:3 in the last 168 hours  No results found for this basename: DIG in the last 168 hours  No results found for this basename: CHOL:3,TRIG:3,HDL:3,LDL:3 in the last 168 hours    Lab 05/01/13 2040   BILITOTAL 0.4   BILIDIRECT --   PROT 7.4   ALB 4.2   ALT 21   AST 14       Lab 05/03/13 0311   MG 1.8     No results found for this basename: PT,INR,PTT in the last 168 hours    Lab 05/03/13 0311 05/02/13 1418 05/01/13 2040   WBC 14.87* 14.17* 12.21*   HGB 12.8* 12.8* 14.1   HCT 38.2* 38.0* 41.5*   PLT 289 166 306       Lab 05/03/13 0311 05/02/13 1418 05/01/13 2040   NA 142 142 138   K 4.4 4.1 4.9   CL 110* 109* 104   CO2 24 23 23    BUN 18.0 20.0 19.0   CREAT 1.3 1.4* 1.8*   EGFR >60.0 >60.0 51.7   GLU 179* 186* 285*   CA  9.1 9.3 9.6       DIAGNOSTIC    EKG: sinus tach (02/24/2013)    Assessment:    Ventricular tachycardia (50-beat run) on 05/03/2013, possible  causes include transient QT prolongation secondary to frequent reglan/phenergan use, metabolic derangements from dehydration/N/V/diarrhea, or ischemia.   Exertional dyspnea   Insulin dependent diabetes (diagnosed at age 52)   Admitted 05/01/13 with N/V/diarrhea secondary to gastroparesis flare/gastroenteritis    Recommendations:    Possible causes for his long run of VT last night include transient QT prolongation secondary to frequent reglan/phenergan use, metabolic derangements from dehydration/N/V/diarrhea, or ischemia (in light of longstanding DM and exertional dyspnea).   Discussed with resident. Continue aggressive rehydration and electrolyte repletion.    VT also occurred shortly after PICC line was placed.  While unlikely, would also get CXR to make sure line was not advanced into the RV, which could lead to VT.    Cardiac enzymes x 3 (ordered)   He hasn't had an EKG since 02/24/13.  Check daily EKG's to monitor QT interval.     Echocardiogram to assess for underlying structural heart disease associated with his VT.    If no clear QT prolongation by serial EKG's and/or VT recurs, then once GI issues resolve, may need cardiac catheterization to assess for underlying ischemic CAD, particularly in light of his longstanding DM and exertional dyspnea.     Signed by: Donnie Mesa, MD    [facility]  NP Spectralink [phone] (8am-5pm)  MD Spectralink [phone] or [phone] (8am-5pm)  Arrhythmia Spectralink [phone] (8am-4:30pm)  After hours, non urgent consult line [phone]  After Hours, urgent consults [phone]

## 2013-05-03 NOTE — Progress Notes (Signed)
Attending Progress Note    A/P: I agree with the exam, assessment and plan as per Dr. Neale Burly this AM.   Sandria Manly sx are better.  Agree with trial of clear liquid diet.   Continue anti-emetics.   V-tach noted.   Will check EKG and stop the erythromycin.   Would also check O2 sat monitor tonight as a screen for sleep apnea.  Consult with Cardiology for discussion of further evaluation. Diabetes is adequately managed.            S:Pt seen and examined concurrently today with Dr. Neale Burly.  Chart reviewed.  Case discussed in detail.  Nausea better.  Would like to try clear liquids today.   No feelings of dizziness or faint.   No chest pain.  No unusual SOB.   No sx with ambulation.           Patient Active Problem List   Diagnosis   . GERD (gastroesophageal reflux disease)   . Gastritis without bleeding   . Type 2 diabetes, uncontrolled, with neuropathy   . Nausea & vomiting   . Benign essential hypertension   . Diabetic gastroparesis   . Abdominal pain   . Gastroparesis   . Nausea vomiting and diarrhea         Current Facility-Administered Medications   Medication Dose Route Frequency Last Rate Last Dose   . 0.9%  NaCl infusion   Intravenous Continuous 150 mL/hr at 05/02/13 0153     . dextrose (GLUCOSE) 40 % oral gel 6 g of glucose  15 g Oral PRN       . dextrose 50 % bolus 25 mL  25 mL Intravenous PRN       . erythromycin (ERYTHROCIN) 100 mg in sodium chloride 0.9 % 250 mL IVPB  100 mg Intravenous Q8H 250 mL/hr at 05/03/13 1014 100 mg at 05/03/13 1014   . glucagon (rDNA) (GLUCAGEN) injection 1 mg  1 mg Intramuscular PRN       . heparin (porcine) injection 5,000 Units  5,000 Units Subcutaneous Q8H SCH   5,000 Units at 05/03/13 0554   . HYDROmorphone (DILAUDID) injection 1 mg  1 mg Intravenous Q4H PRN   1 mg at 05/03/13 1108   . insulin aspart (NovoLOG) injection 1-5 Units  1-5 Units Subcutaneous PRN   1 Units at 05/02/13 1414   . insulin glargine (LANTUS) injection 22 Units  22 Units Subcutaneous QHS   22 Units at 05/02/13  2212   . labetalol (NORMODYNE,TRANDATE) injection 10 mg  10 mg Intravenous Q6H PRN       . LORazepam (ATIVAN) injection 1 mg  1 mg Intravenous Q4H PRN   1 mg at 05/03/13 0315   . metoclopramide (REGLAN) injection 10 mg  10 mg Intravenous TID   10 mg at 05/03/13 1014   . naloxone (NARCAN) injection 0.2 mg  0.2 mg Intravenous PRN       . promethazine (PHENERGAN) injection 12.5 mg  12.5 mg Intravenous Q6H   12.5 mg at 05/02/13 2325       O: Patient is alert and non-toxic.   Heart: Regular   Lungs: Clear   Abdomen: Soft   Ext: No edema   Mental status: Alert         Filed Vitals:    05/03/13 0651   BP: 118/76   Pulse: 120   Temp: 98.2 F (36.8 C)   Resp: 18   SpO2: 95%  Intake/Output Summary (Last 24 hours) at 05/03/13 1120  Last data filed at 05/03/13 0100   Gross per 24 hour   Intake   2450 ml   Output   1205 ml   Net   1245 ml          Results     Procedure Component Value Units Date/Time    Glucose Whole Blood - POCT [[id number]]  (Abnormal) Collected:05/03/13 0725     POCT - Glucose Whole blood 152 (H) mg/dL [id number]:14/78/29 5621    Basic Metabolic Panel [[id number]]  (Abnormal) Collected:05/03/13 0311    Specimen Information:Blood Updated:05/03/13 0401     Glucose 179 (H) mg/dL      BUN 96.2 mg/dL      Creatinine 1.3 mg/dL      CALCIUM 9.1 mg/dL      Sodium 952 mEq/L      Potassium 4.4 mEq/L      Chloride 110 (H) mEq/L      CO2 24 mEq/L     Magnesium [[id number]] Collected:05/03/13 0311    Specimen Information:Blood Updated:05/03/13 0401     Magnesium 1.8 mg/dL     Phosphorus [[id number]] Collected:05/03/13 0311    Specimen Information:Blood Updated:05/03/13 0401     Phosphorus 3.1 mg/dL     GFR [[id number]] DGLOVFIEP:32/95/18 0311     EGFR >60.0 Updated:05/03/13 0401    CBC [[id number]]  (Abnormal) Collected:05/03/13 0311    Specimen Information:Blood / Blood Updated:05/03/13 0345     WBC 14.87 (H) x10 3/uL      RBC 4.72 x10 6/uL      Hgb 12.8 (L) g/dL      Hematocrit 16.0 (L) %      MCV 80.9 fL      MCH 27.1  (L) pg      MCHC 33.5 g/dL      RDW 13 %      Platelets 289 x10 3/uL      MPV 9.6 fL      Nucleated RBC 0 /100 WBC     Glucose Whole Blood - POCT [[id number]]  (Abnormal) Collected:05/02/13 2128     POCT - Glucose Whole blood 147 (H) mg/dL DUKGURK:27/06/23 7628    Glucose Whole Blood - POCT [[id number]]  (Abnormal) Collected:05/02/13 1624     POCT - Glucose Whole blood 140 (H) mg/dL VPXTGGY:69/48/54 6270    Basic Metabolic Panel [[id number]]  (Abnormal) Collected:05/02/13 1418    Specimen Information:Blood Updated:05/02/13 1503     Glucose 186 (H) mg/dL      BUN 29.9 mg/dL      Creatinine 1.4 (H) mg/dL      CALCIUM 9.3 mg/dL      Sodium 371 mEq/L      Potassium 4.1 mEq/L      Chloride 109 (H) mEq/L      CO2 23 mEq/L     GFR [[id number]] Collected:05/02/13 1418     EGFR >60.0 Updated:05/02/13 1503    CBC [[id number]]  (Abnormal) Collected:05/02/13 1418    Specimen Information:Blood / Blood Updated:05/02/13 1449     WBC 14.17 (H) x10 3/uL      RBC 4.74 x10 6/uL      Hgb 12.8 (L) g/dL      Hematocrit 52.7 (L) %      MCV 80.2 fL      MCH 27.0 (L) pg      MCHC 33.7 g/dL      RDW 13 %      Platelets 166 x10 3/uL  MPV 9.6 fL      Nucleated RBC 0 /100 WBC     Glucose Whole Blood - POCT [[id number]]  (Abnormal) Collected:05/02/13 1229     POCT - Glucose Whole blood 190 (H) mg/dL NWGNFAO:13/08/65 7846          Radiology Results (24 Hour)     ** No Results found for the last 24 hours. **                Ashok Cordia M.D. [address] [facility]  [phone]

## 2013-05-03 NOTE — Progress Notes (Signed)
PROGRESS NOTE    Date Time: 05/03/2013 11:31 AM  Patient Name: [name],[name]      Assessment:     Patient Active Problem List     Diagnosis  Date Noted    .  Gastroparesis  02/16/2013    .  Abdominal pain  02/09/2013    .  Diabetic gastroparesis  08/22/2012    .  Benign essential hypertension  08/05/2012    .  Type 2 diabetes, uncontrolled, with neuropathy  06/27/2012    .  Nausea & vomiting  06/27/2012    .  GERD (gastroesophageal reflux disease)  06/17/2012    .  Gastritis without bleeding  06/17/2012      38 y.o. male male with a PMH significant for Gastroparesis (multiple prior admissions), uncontrolled IDDM, and HTN who presents to the hospital with intractable nausea and vomiting for 2 days and watery diarrhea for 5 days.      Plan:   1). Nausea and Vomiting, likely 2/2 Gastroparesis flare   - Phenergan 12.5 mg IV q6h scheduled   - Reglan 10 mg IV TID scheduled   -  Carafate 1 gm PO QID when tolerating PO   - ativan 1 mg IV prn and dilaudid 1 mg q4hrs prn pain   - IVFs  -RUQ [name] today to r/o gallstone  - clear liquid diet    2). Vtach-could be related to prolonged QT in setting of reglan and erythromycin  -D/c erythromycin  -EKG  -[facility] to see today  -Continue home reglan  -Continuous O2 night time sat to see if could be OSA related    3). AKI, likely pre-renal in setting of dehydration   - IVFs as noted above   - will monitor BMP    4). Diarrhea   - suspect this may be 2/2 gastroenteritis   - stool cultures   - IVFs as noted above     5). IDDM, uncontrolled   - continue 22 units lantus   - will order SSI while NPO     6). HTN   - Labetalol PRN   - hold home PO meds while NPO     Code: FULL   Diet: NPO   PPX: SCDs, SQH     Dispo: Continue inpatient treatment       Subjective:   Multiple beats of Vtach overnight.  Cough with 2 episodes vomit as well.    Medications:     Current Facility-Administered Medications   Medication Dose Route Frequency   . heparin (porcine)  5,000 Units Subcutaneous Q8H SCH   .  insulin glargine  22 Units Subcutaneous QHS   . metoclopramide  10 mg Intravenous TID   . promethazine  12.5 mg Intravenous Q6H   . [DISCONTINUED] erythromycin  100 mg Intravenous Q8H       Physical Exam:     Filed Vitals:    05/03/13 0651   BP: 118/76   Pulse: 120   Temp: 98.2 F (36.8 C)   Resp: 18   SpO2: 95%       Intake and Output Summary (Last 24 hours) at Date Time    Intake/Output Summary (Last 24 hours) at 05/03/13 1131  Last data filed at 05/03/13 0100   Gross per 24 hour   Intake   2450 ml   Output   1205 ml   Net   1245 ml     General: awake, alert, oriented x 3;    HEENT:  perrla, eomi, sclera anicteric oropharynx clear without lesions  Neck: supple, no lymphadenopathy, no thyromegaly, no JVD, no carotid bruits   Cardiovascular: sinus tachy, no murmurs, rubs or gallops   Lungs: clear to auscultation bilaterally, without wheezing, rhonchi, or rales   Abdomen: soft, no TTP, non-distended; no palpable masses, no hepatosplenomegaly, hypoactive bowel sounds, no rebound or guarding   Extremities: no clubbing, cyanosis,   Neuro: cranial nerves grossly intact, strength 5/5 in upper and lower extremities, sensation intact,   Skin: no rashes or lesions noted        Labs:     Results     Procedure Component Value Units Date/Time    Glucose Whole Blood - POCT [[id number]]  (Abnormal) Collected:05/03/13 0725     POCT - Glucose Whole blood 152 (H) mg/dL WUJWJXB:14/78/29 5621    Basic Metabolic Panel [[id number]]  (Abnormal) Collected:05/03/13 0311    Specimen Information:Blood Updated:05/03/13 0401     Glucose 179 (H) mg/dL      BUN 96.2 mg/dL      Creatinine 1.3 mg/dL      CALCIUM 9.1 mg/dL      Sodium 952 mEq/[name]      Potassium 4.4 mEq/[name]      Chloride 110 (H) mEq/[name]      CO2 24 mEq/[name]     Magnesium [[id number]] Collected:05/03/13 0311    Specimen Information:Blood Updated:05/03/13 0401     Magnesium 1.8 mg/dL     Phosphorus [[id number]] Collected:05/03/13 0311    Specimen Information:Blood Updated:05/03/13 0401      Phosphorus 3.1 mg/dL     GFR [[id number]] DGLOVFIEP:32/95/18 0311     EGFR >60.0 Updated:05/03/13 0401    CBC [[id number]]  (Abnormal) Collected:05/03/13 0311    Specimen Information:Blood / Blood Updated:05/03/13 0345     WBC 14.87 (H) x10 3/uL      RBC 4.72 x10 6/uL      Hgb 12.8 ([name]) g/dL      Hematocrit 16.0 ([name]) %      MCV 80.9 fL      MCH 27.1 ([name]) pg      MCHC 33.5 g/dL      RDW 13 %      Platelets 289 x10 3/uL      MPV 9.6 fL      Nucleated RBC 0 /100 WBC     Glucose Whole Blood - POCT [[id number]]  (Abnormal) Collected:05/02/13 2128     POCT - Glucose Whole blood 147 (H) mg/dL DUKGURK:27/06/23 7628    Glucose Whole Blood - POCT [[id number]]  (Abnormal) Collected:05/02/13 1624     POCT - Glucose Whole blood 140 (H) mg/dL VPXTGGY:69/48/54 6270    Basic Metabolic Panel [[id number]]  (Abnormal) Collected:05/02/13 1418    Specimen Information:Blood Updated:05/02/13 1503     Glucose 186 (H) mg/dL      BUN 29.9 mg/dL      Creatinine 1.4 (H) mg/dL      CALCIUM 9.3 mg/dL      Sodium 371 mEq/[name]      Potassium 4.1 mEq/[name]      Chloride 109 (H) mEq/[name]      CO2 23 mEq/[name]     GFR [[id number]] Collected:05/02/13 1418     EGFR >60.0 Updated:05/02/13 1503    CBC [[id number]]  (Abnormal) Collected:05/02/13 1418    Specimen Information:Blood / Blood Updated:05/02/13 1449     WBC 14.17 (H) x10 3/uL      RBC 4.74 x10 6/uL      Hgb 12.8 ([name]) g/dL  Hematocrit 38.0 ([name]) %      MCV 80.2 fL      MCH 27.0 ([name]) pg      MCHC 33.7 g/dL      RDW 13 %      Platelets 166 x10 3/uL      MPV 9.6 fL      Nucleated RBC 0 /100 WBC     Glucose Whole Blood - POCT [[id number]]  (Abnormal) Collected:05/02/13 1229     POCT - Glucose Whole blood 190 (H) mg/dL WUJWJXB:14/78/29 5621        Rads:   Radiological Procedure reviewed.    Signed by: Erenest Rasher

## 2013-05-03 NOTE — Progress Notes (Signed)
Pt had 50 beats of VTach, see strip in another note. MD aware, additional morning labs ordered. Pt asymptomatic, VSS, denies sob or cp. Pt returned back to baseline of St 110-120s.

## 2013-05-04 LAB — BASIC METABOLIC PANEL
BUN: 14 mg/dL (ref 9.0–21.0)
CO2: 25 mEq/L (ref 22–29)
Calcium: 8.7 mg/dL (ref 8.5–10.5)
Chloride: 110 mEq/L — ABNORMAL HIGH (ref 98–107)
Creatinine: 1.2 mg/dL (ref 0.7–1.3)
Glucose: 208 mg/dL — ABNORMAL HIGH (ref 70–100)
Potassium: 4.3 mEq/L (ref 3.5–5.1)
Sodium: 143 mEq/L (ref 136–145)

## 2013-05-04 LAB — GLUCOSE WHOLE BLOOD - POCT
Whole Blood Glucose POCT: 186 mg/dL — ABNORMAL HIGH (ref 70–100)
Whole Blood Glucose POCT: 178 mg/dL — ABNORMAL HIGH (ref 70–100)
Whole Blood Glucose POCT: 164 mg/dL — ABNORMAL HIGH (ref 70–100)
Whole Blood Glucose POCT: 280 mg/dL — ABNORMAL HIGH (ref 70–100)

## 2013-05-04 LAB — ECG 12-LEAD
Atrial Rate: 123 {beats}/min
Atrial Rate: 112 {beats}/min
P Axis: 60 degrees
P Axis: 61 degrees
P-R Interval: 164 ms
P-R Interval: 166 ms
Q-T Interval: 302 ms
Q-T Interval: 334 ms
QRS Duration: 86 ms
QRS Duration: 88 ms
QTC Calculation (Bezet): 432 ms
QTC Calculation (Bezet): 455 ms
R Axis: 38 degrees
R Axis: 43 degrees
T Axis: 4 degrees
T Axis: -27 degrees
Ventricular Rate: 123 {beats}/min
Ventricular Rate: 112 {beats}/min

## 2013-05-04 LAB — CBC
Hematocrit: 37 % — ABNORMAL LOW (ref 42.0–52.0)
Hgb: 12 g/dL — ABNORMAL LOW (ref 13.0–17.0)
MCH: 26.4 pg — ABNORMAL LOW (ref 28.0–32.0)
MCHC: 32.4 g/dL (ref 32.0–36.0)
MCV: 81.3 fL (ref 80.0–100.0)
MPV: 9.3 fL — ABNORMAL LOW (ref 9.4–12.3)
Nucleated RBC: 0 /100 WBC (ref 0–1)
Platelets: 273 10*3/uL (ref 140–400)
RBC: 4.55 10*6/uL — ABNORMAL LOW (ref 4.70–6.00)
RDW: 13 % (ref 12–15)
WBC: 9.57 10*3/uL (ref 3.50–10.80)

## 2013-05-04 LAB — TROPONIN I: Troponin I: 0.04 ng/mL (ref 0.00–0.09)

## 2013-05-04 LAB — MAGNESIUM: Magnesium: 1.8 mg/dL (ref 1.6–2.6)

## 2013-05-04 LAB — GFR: EGFR: >60

## 2013-05-04 NOTE — Progress Notes (Signed)
Notified of testing at approx 0050. Test begun at 0100. Pt currently on 21% and awake.

## 2013-05-04 NOTE — Progress Notes (Addendum)
[facility] PROGRESS NOTE  [facility]      Date Time: 05/04/2013 10:12 AM  Patient Name: [name], [name]  Medical Record #:  [id number]  Account#:  [id number]  Admission Date:  05/01/2013         Patient Active Problem List   Diagnosis   . GERD (gastroesophageal reflux disease)   . Gastritis without bleeding   . Type 2 diabetes, uncontrolled, with neuropathy   . Nausea & vomiting   . Benign essential hypertension   . Diabetic gastroparesis   . Abdominal pain   . Gastroparesis   . Nausea vomiting and diarrhea       Subjective:   Denies chest pain, SOB or palpitations.    Assessment:   Consulted for Ventricular tachycardia (50-beat run) on 05/03/2013, possible causes include transient QT prolongation secondary to frequent reglan/phenergan use, metabolic derangements from dehydration/N/V/diarrhea, or ischemia.   Exertional dyspnea   Insulin dependent diabetes (diagnosed at age 38)   Admitted 05/01/13 with N/V/diarrhea secondary to gastroparesis flare/gastroenteritis  Troponins x 3 negative  Tip of PICC in lower SVC      Recommendations:   No further VT on tele overnight. Hydrated overnight with IVF. QTc this morning less than . He is taking Reglan and Phenergan and Zofran is discontinued. Maintain K>4, Mg>2. PICC line in lower SVC on CXR  Review echocardiogram to assess for underlying structural heart disease associated with his VT.   Outpatient ischemic eval to assess for underlying ischemia    Patient seen and examined, my assessment and plan as above. Patient very drowsy this AM. Normal heart sounds on examination, briefly reviewed echo at bedside and EF is normal. I do not see any clear evidence of significant QT prolongation even though could certainly have had transiently. Will likely do inpatient rather than outpatient ischemic evaluation when better given the very long episode of VT on 05/02/13. If no further episodes, and if EF and wall motion is normal on full echo reading, then plan Lexiscan MPI  before discharge. If continues to have VT without clear QT prolongation then will plan cardiac catheterization before discharge. Both should wait until his underlying GI issues are improved unless circumstances dictate otherwise.    Medications:      Scheduled Meds:         heparin (porcine) 5,000 Units Subcutaneous Q8H SCH   insulin glargine 22 Units Subcutaneous QHS   metoclopramide 10 mg Intravenous TID   promethazine 12.5 mg Intravenous Q6H   [DISCONTINUED] erythromycin 100 mg Intravenous Q8H       Continuous Infusions:       . sodium chloride 150 mL/hr at 05/04/13 6962            Physical Exam:       VITAL SIGNS PHYSICAL EXAM   Filed Vitals:    05/04/13 0749   BP: 140/84   Pulse: 102   Temp: 97 F (36.1 C)   Resp: 16   SpO2: 95%     Temp (24hrs), Avg:97.6 F (36.4 C), Min:96.3 F (35.7 C), Max:98.6 F (37 C)      Telemetry: Reviewed : ST 100-120      Intake/Output Summary (Last 24 hours) at 05/04/13 1012  Last data filed at 05/04/13 0400   Gross per 24 hour   Intake   4800 ml   Output    803 ml   Net   3997 ml    Physical Exam  General: sleepy, breathing comfortable, no acute  distress  Head: normocephalic  Eyes: EOM's intact  Cardiovascular: regular rate and rhythm, S1, S2, no S3, no S4, no murmurs, rubs or gallops  Neck: no carotid bruits or JVD  Lungs: clear to auscultation bilateraly, without wheezing, rhonchi, or rales  Abdomen: soft, non-tender, non-distended; no palpable masses,  normoactive bowel sounds  Extremities: no edema  Pulse: equal pulses, 4/4 symmetric  Neurological: Alert and oriented X3, drowsy         Labs:     Lab 05/04/13 0212 05/03/13 1756 05/03/13 1246   CK -- -- --   TROPI 0.04 0.04 0.02   TROPT -- -- --   CKMBINDEX -- -- --     No results found for this basename: DIG in the last 168 hours  No results found for this basename: CHOL:3,TRIG:3,HDL:3,LDL:3 in the last 168 hours    Lab 05/01/13 2040   BILITOTAL 0.4   BILIDIRECT --   PROT 7.4   ALB 4.2   ALT 21   AST 14       Lab 05/04/13  0211   MG 1.8     No results found for this basename: PT,INR,PTT in the last 168 hours    Lab 05/04/13 0211 05/03/13 0311 05/02/13 1418   WBC 9.57 14.87* 14.17*   HGB 12.0* 12.8* 12.8*   HCT 37.0* 38.2* 38.0*   PLT 273 289 166       Lab 05/04/13 0211 05/03/13 0311 05/02/13 1418   NA 143 142 142   K 4.3 4.4 4.1   CL 110* 110* 109*   CO2 25 24 23    BUN 14.0 18.0 20.0   CREAT 1.2 1.3 1.4*   EGFR >60.0 >60.0 >60.0   GLU 208* 179* 186*   CA 8.7 9.1 9.3     No results found for this basename: TSH,FREET3,FREET4 in the last 168 hours    .  No results found for this basename: BNP        Imaging:   Radiological Procedure reviewed.        Signed by: Dreama Saa, [place]      [facility]  NP Spectralink [phone] (8am-5pm)  MD Spectralink [phone] or [phone] (8am-5pm)  Arrhythmia Spectralink [phone] (8am-4:30pm)  After hours, non urgent consult line [phone]  After Hours, urgent consults [phone]

## 2013-05-04 NOTE — Discharge Summary (Signed)
FAMILY MEDICINE DISCHARGE SUMMARY    Date Time: 05/04/2013 1:11 PM  Patient Name: [name]  Attending Physician: Ashok Cordia, MD  Primary Care Physician: [name], MD    Date of Admission: 05/01/2013  Date of Discharge: 05/09/2013    Discharge Diagnoses:     Active Problems:   * No active hospital problems. *   Resolved Problems:   Nausea vomiting and diarrhea      Disposition:     home with family    Pending Results, Recommendations & Instructions to providers after discharge:     1. Micro / Labs / Path pending: none    Recent Labs - Last 2:         Lab 05/04/13 0211 05/03/13 0311   WBC 9.57 14.87*   HGB 12.0* 12.8*   HCT 37.0* 38.2*   PLT 273 289       No results found for this basename: PT,INR,PTT in the last 168 hours    Lab 05/01/13 2040   ALKPHOS 112   BILITOTAL 0.4   BILIDIRECT --   PROT 7.4   ALB 4.2   ALT 21   AST 14        Lab 05/04/13 0211 05/03/13 0311   NA 143 142   K 4.3 4.4   CL 110* 110*   CO2 25 24   BUN 14.0 18.0   GLU 208* 179*   CA 8.7 9.1   MG 1.8 1.8   PHOS -- 3.1       No results found for this basename: CHOL,TRIG,HDL,LDL in the last 168 hours  No results found for this basename: TSH,FREET3,FREET4 in the last 168 hours         Procedures/Radiology performed:     XR ABDOMEN AP  US ABDOMEN LIMITED RUQ  XR CHEST AP PORTABLE  ECHOCARDIOGRAM ADULT COMPLETE W CLR/ DOPP WAVEFORM       Hospital Course:     Reason for admission/ HPI:     [name] is a 38 y.o. male male with a PMH significant for Gastroparesis (multiple prior admissions), uncontrolled IDDM, and HTN who presents to the hospital with intractable nausea and vomiting for 2 days and watery diarrhea for 5 days. Reports eating [employer] prior to the onset of symptoms. Currently is having non-radiating epigastric pain with NBNB emesis. Denies having any HA, vision changes, CP, SOB, hematuria, f/c, or edema.   ED Course:   - Tachy to 118, afebrile, normotensive   - Cr 1.8, WBC 12.2k   - KUB indeterminate due to minimal  bowel gas     (H&P per Dr. Penne Lash)     Hospital Course:   [name] is a 38 y.o. male male with a PMH significant for Gastroparesis (multiple prior admissions), uncontrolled IDDM, and HTN who presented to the hospital with intractable nausea and vomiting for 2 days and watery diarrhea for 5 days. His nausea/vomiting was thought secondary to gastroparesis flare. Diarrhea thought likely gastroenteritis and resolved during hospitalization.  He was afebrile, no leukoctyosis and normal liver function tests and lipase. Imaging showed that he was post-cholecystectomy with normal common bile duct. Increased echogenicity noted within right lobe of liver, to be followed up as an outpatient. He was made NPO with IVFs and he was started on regimen of scheduled Reglan, Phenergan, Erythromycin, Protonix. Erythromycin later discontinued secondary to concern over QT prolongation in setting of vtach, see below. His diet was gradually advanced, however, without improvement in  symptoms. He was made NPO again with IVFs for 24 hours. He subsequently had resolution of abdominal pain and nausea/vomiting. He tolerated clears the following day and then advanced to low fat diet. He was discharged home the following day on his home PO meds, no new medication changes.    During hospitalization, he was noted to have 50 beats of Vtach, asymptomatic. EKG showed normal QTc interval, but erythromycin discontinued. Cardiac enzymes drawn and were negative. ECHO showed normal EF 55-65%, mild concentric LVH, dilated RV but normal RV function.  Nocturnal oxygen saturations were normal, less likely related to OSA. Cardiology consulted. CT coronary angiogram  without evidence of atherosclerosis or coronary stenosis, small Left sided effusion to be followed-up as outpatient. No further Vtach exhibited. Daily EKGs with normal QTc but erythromycin discontinued and other QT prolonging agents avoided during remainder of hospitalization. Plan to follow-up  with cardiology in 2-3 weeks post-discharge.    Discharge day exam:  Filed Vitals:    05/09/13 0729   BP: 165/99   Pulse: 65   Temp: 97.3 F (36.3 C)   Resp: 20   SpO2: 93%     General: awake, alert, oriented, resting, appears comfortable   HEENT: moist, mucous membranes   Cardiovascular: regular rate and rhythm, no murmurs, rubs or gallops   Lungs: clear to auscultation bilaterally, without wheezing, rhonchi, or rales   Abdomen: soft, nontender, nondistended. Normoactive bowel sounds.   Extremities: no clubbing, cyanosis, edema, 2+ dp pulses   Skin: no rashes or lesions noted        Consultations:     Treatment Team: Attending Provider: Ashok Cordia, MD; Technician: Hendricks Limes; Technician: Maudry Mayhew; Registered Nurse: Janalyn Shy, RN; Case Manager: Shirlean Mylar    Discharge Condition:     stable    Discharge Instructions & Follow Up Plan for Patient:     Diet: low fat diet    Activity/Weight Bearing Status: as tolerated    Patient was instructed to follow up with:   Primary Care Doctor [name], MD in  3-4 days & the following Consultants:  1. [facility] in 2-3 weeks    Discharge Code Status: Full    Complete instructions and follow up are in the patient's After Visit Summary    Minutes spent coordinating discharge and reviewing discharge plan 30 minutes    Discharge Medications:        Medication List       As of 05/09/2013  9:58 AM      CONTINUE taking these medications           amLODIPine 5 MG tablet    Commonly known as: NORVASC    Take 2 tablets (10 mg total) by mouth daily.        gabapentin 300 MG capsule    Commonly known as: NEURONTIN        insulin glargine 100 UNIT/ML injection    Commonly known as: LANTUS        lisinopril 40 MG tablet    Commonly known as: PRINIVIL,ZESTRIL    Take 1 tablet (40 mg total) by mouth daily.        metoclopramide 10 MG tablet    Commonly known as: REGLAN        metoprolol XL 50 MG 24 hr tablet    Commonly known as: TOPROL-XL     Take 1 tablet (50 mg total) by mouth daily.        pantoprazole  40 MG tablet    Commonly known as: PROTONIX        promethazine 12.5 MG tablet    Commonly known as: PHENERGAN    Take 1 tablet (12.5 mg total) by mouth every 6 (six) hours.        sucralfate 1 G tablet    Commonly known as: CARAFATE               Where to get your medications     These are the prescriptions that you need to pick up.    You may get these medications from any pharmacy.           lisinopril 40 MG tablet    metoprolol XL 50 MG 24 hr tablet                            [facility]   [address]   [place], [place] [address]  [phone]    Signed by: Aquilla Hacker, MD    CC: [name], MD

## 2013-05-04 NOTE — Plan of Care (Signed)
VSS, ST on tele HR 100-110s. Vomited x2 on night shift, green bile. PRN dilaudid given with good results. Pt refused to eat anything on his tray. NS @150  ml/hr. Troponin x3    were negative. Sleep study was started late at 1am. RN observes normal breathing pattern while pt sleeping, no signs of apnea.

## 2013-05-04 NOTE — Progress Notes (Signed)
PROGRESS NOTE    Date Time: 05/04/2013 7:47 AM  Patient Name: [name],[name]      Assessment:     Patient Active Problem List     Diagnosis  Date Noted    .  Gastroparesis  02/16/2013    .  Abdominal pain  02/09/2013    .  Diabetic gastroparesis  08/22/2012    .  Benign essential hypertension  08/05/2012    .  Type 2 diabetes, uncontrolled, with neuropathy  06/27/2012    .  Nausea & vomiting  06/27/2012    .  GERD (gastroesophageal reflux disease)  06/17/2012    .  Gastritis without bleeding  06/17/2012      38 y.o. male male with a PMH significant for Gastroparesis (multiple prior admissions), uncontrolled IDDM, and HTN who presents to the hospital with intractable nausea and vomiting for 2 days and watery diarrhea for 5 days.      Plan:   1). Nausea and Vomiting, likely 2/2 Gastroparesis flare -continued symptoms  - Phenergan 12.5 mg IV q6h scheduled   - Reglan 10 mg IV TID scheduled   -  Carafate 1 gm PO QID when tolerating PO   - ativan 1 mg IV prn and dilaudid 1 mg q4hrs prn pain, required 5 mg Dilaudid and 2 mg Ativan over past 24 hrs  - IVFs @ 150 cc/hr  -clear liquid diet, advance as tolerated  -RUQ US shows s/p cholecystectomy, no gallstones. Small hyperechogenicity w/in right liver lobe, to be followed up as outpatient    2). Vtach-could be related to prolonged QT in setting of reglan and erythromycin. No further episodes on telemetry since 05/02/13  -Erythromycin discontinued  -EKG this am with QTc <500 ms   -Echo pending  -troponins negative X 3  -appreciate cardiology recs, ischemic evaluation as outpatient  -Continue home reglan  -Continuous O2 night time sat without desaturation, less likely OSA    3). AKI, likely pre-renal in setting of dehydration -improved  -Creatinine 1.2 today  - IVFs as noted above   - will monitor BMP    4). Diarrhea-improving  - suspect this may be 2/2 gastroenteritis   - stool cultures, c-diff pending  - IVFs as noted above     5). IDDM, uncontrolled: BS 150-200's  - continue  22 units lantus qhs     6). HTN   - Labetalol PRN   - hold home PO meds until tolerating PO (Metoprolol, Amlodipine, Lisinopril)    Code: FULL   Diet: clear liquid, IVFs  PPX: SCDs, SQH     Discussed with Dr. Rogelia Boga    Dispo: Continue inpatient treatment for persistent nausea/vomit, not tolerating PO.      Subjective:   No acute events overnight, no further Vtach noted on monitor. Patient with continued nausea/vomit. No further episodes of diarrhea. Still not tolerating clears.    Medications:     Current Facility-Administered Medications   Medication Dose Route Frequency   . heparin (porcine)  5,000 Units Subcutaneous Q8H SCH   . insulin glargine  22 Units Subcutaneous QHS   . metoclopramide  10 mg Intravenous TID   . promethazine  12.5 mg Intravenous Q6H   . [DISCONTINUED] erythromycin  100 mg Intravenous Q8H       Physical Exam:     Filed Vitals:    05/04/13 0330   BP: 176/98   Pulse: 115   Temp: 97 F (36.1 C)   Resp: 18   SpO2:  96%       Intake and Output Summary (Last 24 hours) at Date Time    Intake/Output Summary (Last 24 hours) at 05/04/13 0747  Last data filed at 05/04/13 0400   Gross per 24 hour   Intake   4800 ml   Output    803 ml   Net   3997 ml     General: awake, alert, oriented, resting, appears uncomfortable  HEENT: moist, mucous membranes  Cardiovascular: sinus tachy, no murmurs, rubs or gallops   Lungs: clear to auscultation bilaterally, without wheezing, rhonchi, or rales   Abdomen: soft, generalized tenderness  Extremities: no clubbing, cyanosis, edema  Neuro: cranial nerves grossly intact, strength 5/5 in upper and lower extremities, sensation intact,   Skin: no rashes or lesions noted        Labs:     Results     Procedure Component Value Units Date/Time    Glucose Whole Blood - POCT [[id number]]  (Abnormal) Collected:05/04/13 0737     POCT - Glucose Whole blood 186 (H) mg/dL WUJWJXB:14/78/29 5621    Troponin I [[id number]] Collected:05/04/13 0212    Specimen Information:Blood  Updated:05/04/13 0248     Troponin I 0.04 ng/mL     Basic Metabolic Panel [[id number]]  (Abnormal) Collected:05/04/13 0211    Specimen Information:Blood Updated:05/04/13 0247     Glucose 208 (H) mg/dL      BUN 32.4 mg/dL      Creatinine 1.2 mg/dL      CALCIUM 8.7 mg/dL      Sodium 401 mEq/[name]      Potassium 4.3 mEq/[name]      Chloride 110 (H) mEq/[name]      CO2 25 mEq/[name]     Magnesium [[id number]] Collected:05/04/13 0211    Specimen Information:Blood Updated:05/04/13 0247     Magnesium 1.8 mg/dL     GFR [[id number]] DGLOVFIEP:32/95/18 0211     EGFR >60.0 Updated:05/04/13 0247    CBC [[id number]]  (Abnormal) Collected:05/04/13 0211    Specimen Information:Blood / Blood Updated:05/04/13 0231     WBC 9.57 x10 3/uL      RBC 4.55 ([name]) x10 6/uL      Hgb 12.0 ([name]) g/dL      Hematocrit 16.0 ([name]) %      MCV 81.3 fL      MCH 26.4 ([name]) pg      MCHC 32.4 g/dL      RDW 13 %      Platelets 273 x10 3/uL      MPV 9.3 ([name]) fL      Nucleated RBC 0 /100 WBC     Glucose Whole Blood - POCT [[id number]]  (Abnormal) Collected:05/03/13 2105     POCT - Glucose Whole blood 216 (H) mg/dL DUKGURK:27/06/23 7628    Troponin I [[id number]] Collected:05/03/13 1756    Specimen Information:Blood Updated:05/03/13 1844     Troponin I 0.04 ng/mL     Glucose Whole Blood - POCT [[id number]]  (Abnormal) Collected:05/03/13 1624     POCT - Glucose Whole blood 189 (H) mg/dL SWNIOEV:03/50/09 3818    Troponin I [[id number]] Collected:05/03/13 1246    Specimen Information:Blood Updated:05/03/13 1320     Troponin I 0.02 ng/mL     Glucose Whole Blood - POCT [[id number]]  (Abnormal) Collected:05/03/13 1152     POCT - Glucose Whole blood 153 (H) mg/dL PZWCHEN:27/78/24 2353        Rads:   Radiological Procedure reviewed.    Signed by: Aquilla Hacker

## 2013-05-04 NOTE — Progress Notes (Signed)
[facility] Attending Note    This patient was evaluated on rounds with Dr. Hunt Oris.  Physical exam directly observed and duplicated.  I agree with assessment and plan as outlined.      Drowsy this AM. Diarrhea improved; still with pain. Tele unremarkable and normal QT on today's EKG. Agree with ongoing symptom management of gastroparesis flare, eventual ischemic eval to finish vtach w/u, and ongoing avoidance of QT prolonging agents.     Patient Active Problem List   Diagnosis   . GERD (gastroesophageal reflux disease)   . Gastritis without bleeding   . Type 2 diabetes, uncontrolled, with neuropathy   . Nausea & vomiting   . Benign essential hypertension   . Diabetic gastroparesis   . Abdominal pain   . Gastroparesis   . Nausea vomiting and diarrhea

## 2013-05-04 NOTE — Plan of Care (Signed)
Pt is alert and oriented but very drowsy.  Pt ambulates independently.  Pt continues to have nausea, vomiting, and abdominal pain.  Pt treated with scheduled anti-emetics, PRN IV dilaudid, and PRN ativan.  Telemetry tracing normal sinus rhythm to sinus tachycardia.  Echo performed on pt this shift with results still pending.

## 2013-05-04 NOTE — Progress Notes (Addendum)
Case Management  Initial Discharge Planning Assessment      [place] Dispo: Home, CPAP?, schedule PCP f/u apt closer to d/c  Anticipated [place] Date: TBD    Admitting Diagnosis  Nausea & vomiting [787.01]  Diabetic gastroparesis [250.60, 536.3]  Nausea vomiting and diarrhea [787.91, 787.01] (Nausea vomiting and diarrhea)        Psychosocial/Demographic Information   Name of interviewee: [name]     Healthcare Decision Maker Self   Living Situation   Pt lives with friends. Type of housing unknown.   Private Residence   Prior level of functioning Patient is independent with activities of daily living. Pt dresses, grooms, and bathes self and does not require any additional assistance.      Insurance INFO Payor: OUT OF STATE BLUE CROSS  Plan: BCBS BLUE CARD OUT OF STATE  Product Type: *No Product type*         Emergency Contacts Extended Emergency Contact Information  Primary Emergency Contact: Simons,Neffy   [place] [employer] Phone: [phone]  Relation: Friend     Advance Directive <no information>     Source of Income         Discharge Planning Services in Place  Primary Care Physician   [name], MD    [phone]   DME None   Acute Rehab or SNF History None   Home Services None. Pt Does not have a  preference for [facility] if  needed upon [place].    Dialysis N/A         Readmission Assessment  Current LACE Score Reviewed? Yes-13   30 day readmission? No   Does the patient have difficulty obtaining his/her medications? No   Does the patient have difficulty getting to his/her physician appointments? No         Anticipated Discharge Plan  Discussed [place] Dispo w/ Patient    Anticipated Disposition: Option A Home, CPAP?, schedule PCP f/u apt closer to d/c   Anticipated Disposition: Option B Friends   [place] Transport:  Friends v Wheelchair   If applicable, were SNF or Hospice choices provided? N/A   Palliative/Geriatric  Care Consult needed?   No   Elderlink, TCM, or PACE Referral needed?   Not at this  time, TBD   [place] Barriers:           Inpatient Medicare/Medicare HMO Patients Only  Initial IMM signed within 24 hours of admissiom  N/A     Uninsured Patients Only  If patient has a spouse, does your spouse have insurance under his/her place of employment? N/A   Did the patient sign up for insurance through the Affordable Care Act? N/A       Shirlean Mylar, MSW  [facility] Discharge Planner  [facility]  [phone]

## 2013-05-05 LAB — GLUCOSE WHOLE BLOOD - POCT
Whole Blood Glucose POCT: 131 mg/dL — ABNORMAL HIGH (ref 70–100)
Whole Blood Glucose POCT: 199 mg/dL — ABNORMAL HIGH (ref 70–100)
Whole Blood Glucose POCT: 185 mg/dL — ABNORMAL HIGH (ref 70–100)
Whole Blood Glucose POCT: 160 mg/dL — ABNORMAL HIGH (ref 70–100)

## 2013-05-05 LAB — CBC AND DIFFERENTIAL
Basophils Absolute Automated: 0.06 10*3/uL (ref 0.00–0.20)
Basophils Automated: 1 %
Eosinophils Absolute Automated: 0.68 10*3/uL (ref 0.00–0.70)
Eosinophils Automated: 8 %
Hematocrit: 35.8 % — ABNORMAL LOW (ref 42.0–52.0)
Hgb: 11.8 g/dL — ABNORMAL LOW (ref 13.0–17.0)
Immature Granulocytes Absolute: 0.03 10*3/uL
Immature Granulocytes: 0 %
Lymphocytes Absolute Automated: 3.45 10*3/uL (ref 0.50–4.40)
Lymphocytes Automated: 39 %
MCH: 26.8 pg — ABNORMAL LOW (ref 28.0–32.0)
MCHC: 33 g/dL (ref 32.0–36.0)
MCV: 81.2 fL (ref 80.0–100.0)
MPV: 9.4 fL (ref 9.4–12.3)
Monocytes Absolute Automated: 0.66 10*3/uL (ref 0.00–1.20)
Monocytes: 8 %
Neutrophils Absolute: 3.96 10*3/uL (ref 1.80–8.10)
Neutrophils: 45 %
Nucleated RBC: 0 /100 WBC (ref 0–1)
Platelets: 229 10*3/uL (ref 140–400)
RBC: 4.41 10*6/uL — ABNORMAL LOW (ref 4.70–6.00)
RDW: 13 % (ref 12–15)
WBC: 8.84 10*3/uL (ref 3.50–10.80)

## 2013-05-05 LAB — GFR: EGFR: >60

## 2013-05-05 LAB — BASIC METABOLIC PANEL
BUN: 10 mg/dL (ref 9.0–21.0)
CO2: 26 mEq/L (ref 22–29)
Calcium: 8.7 mg/dL (ref 8.5–10.5)
Chloride: 110 mEq/L — ABNORMAL HIGH (ref 98–107)
Creatinine: 1.1 mg/dL (ref 0.7–1.3)
Glucose: 158 mg/dL — ABNORMAL HIGH (ref 70–100)
Potassium: 3.4 mEq/L — ABNORMAL LOW (ref 3.5–5.1)
Sodium: 145 mEq/L (ref 136–145)

## 2013-05-05 LAB — MAGNESIUM: Magnesium: 1.8 mg/dL (ref 1.6–2.6)

## 2013-05-05 MED ORDER — POTASSIUM CHLORIDE CRYS ER 20 MEQ PO TBCR
20.0000 meq | EXTENDED_RELEASE_TABLET | Freq: Once | ORAL | Status: DC
Start: 2013-05-05 — End: 2013-05-09
  Filled 2013-05-05: qty 1

## 2013-05-05 MED ORDER — MAGNESIUM SULFATE IN D5W 10-5 MG/ML-% IV SOLN
1.0000 g | INTRAVENOUS | Status: AC
Start: 2013-05-05 — End: 2013-05-05
  Administered 2013-05-05: 1 g via INTRAVENOUS
  Filled 2013-05-05: qty 100

## 2013-05-05 NOTE — Progress Notes (Signed)
PROGRESS NOTE    Date Time: 05/05/2013 7:33 AM  Patient Name: [name],[name]      Assessment:     Patient Active Problem List     Diagnosis  Date Noted    .  Gastroparesis  02/16/2013    .  Abdominal pain  02/09/2013    .  Diabetic gastroparesis  08/22/2012    .  Benign essential hypertension  08/05/2012    .  Type 2 diabetes, uncontrolled, with neuropathy  06/27/2012    .  Nausea & vomiting  06/27/2012    .  GERD (gastroesophageal reflux disease)  06/17/2012    .  Gastritis without bleeding  06/17/2012      38 y.o. male male with a PMH significant for Gastroparesis (multiple prior admissions), uncontrolled IDDM, and HTN who presents to the hospital with intractable nausea and vomiting,watery diarrhea, likely secondary to gastroparesis flare. Minimally improved, still unable to tolerate PO    Plan:   1). Nausea and Vomiting, likely 2/2 Gastroparesis flare -continued symptoms  - Phenergan 12.5 mg IV q6h scheduled   - Reglan 10 mg IV TID scheduled   -  Carafate 1 gm PO QID when tolerating PO, will hold on adding this today  - ativan 1 mg IV prn and dilaudid 1 mg q4hrs prn pain, required 2 mg Dilaudid and 2 mg Ativan over past 24 hrs  - IVFs @ 150 cc/hr  -clear liquid diet, advance as tolerated  -monitor electrolyte, replete as needed  -RUQ US shows s/p cholecystectomy, no gallstones. Small hyperechogenicity w/in right liver lobe, to be followed up as outpatient    2). Vtach-could be related to prolonged QT in setting of reglan and erythromycin. No further episodes of sustained Vtach on telemetry since 05/02/13  -Erythromycin discontinued  -EKG yesterday with QTc <500 ms   -echo with normal EF 55-65%, mild concentric LVH, dilated RV but normal RV function  -troponins negative X 3  -Continuous O2 night time sat without desaturation, less likely OSA  -appreciate cardiology recs, ischemic evaluation as outpatient    3). AKI, likely pre-renal in setting of dehydration -improved  -Creatinine 1.1y  - IVFs as noted above   -  will monitor BMP    4). Diarrhea--resolved  - suspect this may be 2/2 gastroenteritis   - IVFs as noted above     5). IDDM, uncontrolled: BS 150-200's  - continue 22 units lantus qhs     6). HTN   - Labetalol PRN   - hold home PO meds until tolerating PO (Metoprolol, Amlodipine, Lisinopril)    Code: FULL   Diet: clear liquid, IVFs  PPX: SCDs, SQH     Discussed with Dr. Rogelia Boga    Dispo: Continue inpatient treatment for persistent nausea/vomit, not tolerating PO.      Subjective:   No acute events overnight, no episodes of sustained Vtach noted on monitor. Patient was feeling better last pm, tolerated ginger ale and juice. He woke-up this morning with worsening abdominal pain and nausea. Multiple episodes of emesis this am.      Medications:     Current Facility-Administered Medications   Medication Dose Route Frequency   . heparin (porcine)  5,000 Units Subcutaneous Q8H SCH   . insulin glargine  22 Units Subcutaneous QHS   . metoclopramide  10 mg Intravenous TID   . promethazine  12.5 mg Intravenous Q6H       Physical Exam:     Filed Vitals:  05/05/13 0450   BP: 147/71   Pulse: 84   Temp: 98.6 F (37 C)   Resp: 18   SpO2: 96%       Intake and Output Summary (Last 24 hours) at Date Time  No intake or output data in the 24 hours ending 05/05/13 0733  General: awake, alert, oriented, resting, appears uncomfortable  HEENT: moist, mucous membranes  Cardiovascular: regular rate and rhythm, no murmurs, rubs or gallops   Lungs: clear to auscultation bilaterally, without wheezing, rhonchi, or rales   Abdomen: soft, generalized tenderness  Extremities: no clubbing, cyanosis, edema  Neuro: cranial nerves grossly intact, strength 5/5 in upper and lower extremities, sensation intact,   Skin: no rashes or lesions noted        Labs:     Results     Procedure Component Value Units Date/Time    Glucose Whole Blood - POCT [[id number]]  (Abnormal) Collected:05/05/13 0636     POCT - Glucose Whole blood 131 (H) mg/dL ZHYQMVH:84/69/62  9528    Magnesium [[id number]] Collected:05/05/13 0446    Specimen Information:Blood Updated:05/05/13 0529     Magnesium 1.8 mg/dL     Glucose Whole Blood - POCT [[id number]]  (Abnormal) Collected:05/04/13 2050     POCT - Glucose Whole blood 280 (H) mg/dL IHKVQQV:95/63/87 5643    Glucose Whole Blood - POCT [[id number]]  (Abnormal) Collected:05/04/13 1633     POCT - Glucose Whole blood 164 (H) mg/dL YSAYTKZ:60/10/93 2355    Glucose Whole Blood - POCT [[id number]]  (Abnormal) Collected:05/04/13 1212     POCT - Glucose Whole blood 178 (H) mg/dL HCWCBJS:28/31/51 7616    Glucose Whole Blood - POCT [[id number]]  (Abnormal) Collected:05/04/13 0737     POCT - Glucose Whole blood 186 (H) mg/dL RSWNIOE:70/35/00 9381        Rads:   Radiological Procedure reviewed.    Signed by: Aquilla Hacker

## 2013-05-05 NOTE — Plan of Care (Signed)
Pt is SR,sometimes tachy on tele. VSS on RA. Pt has nausea and given antiemetic frequently per MD order. Pt unable to keep any thing down. NS running on 150 ml/h. PT given dilaudid q4h for his abdomin pain. Will continue to monitor per unit protocol.

## 2013-05-05 NOTE — Progress Notes (Addendum)
[place] HEART PROGRESS NOTE  [facility]      Date Time: 05/05/2013 10:24 AM  Patient Name: [name], [name]  Medical Record #:  [id number]  Account#:  [id number]  Admission Date:  05/01/2013         Patient Active Problem List   Diagnosis   . GERD (gastroesophageal reflux disease)   . Gastritis without bleeding   . Type 2 diabetes, uncontrolled, with neuropathy   . Nausea & vomiting   . Benign essential hypertension   . Diabetic gastroparesis   . Abdominal pain   . Gastroparesis   . Nausea vomiting and diarrhea       Subjective:   Denies chest pain, SOB or palpitations. Patient reports pain and he is actively vomiting    Assessment:   Consulted for Ventricular tachycardia (50-beat run) on 05/03/2013, possible causes include transient QT prolongation secondary to frequent reglan/phenergan use, metabolic derangements from dehydration/N/V/diarrhea, or ischemia.   Exertional dyspnea   Insulin dependent diabetes (diagnosed at age 38)   Admitted 05/01/13 with N/V/diarrhea secondary to gastroparesis flare/gastroenteritis  Troponins x 3 negative  Tip of PICC in lower SVC  Echo: nl EF, no wall motion abnormalities    Recommendations:   No further VT on tele overnight. However tele reveals frequent PVCs.  Maintain K>4, Mg>2  EKG today  Would pull back PICC slightly to make sure its not flipping into the RA and producing ectopy  Continue hydrating  Plan for inpatient ischemic evaluation after his GI issues are quiescent    Patient seen and examined, my assessment and plan as above. Echo is reassuringly normal. Normal heart sounds on examination. Still with severe nausea and emesis. Agree with pulling back PICC slightly but would seem unlikely to be far enough to cause RV ectopy. Repeat EKG today to assess QT interval. Eventual ischemic evaluation when GI issues settled, likely noninvasive but depends on clinical course.    Medications:      Scheduled Meds:           heparin (porcine) 5,000 Units Subcutaneous Q8H SCH    insulin glargine 22 Units Subcutaneous QHS   metoclopramide 10 mg Intravenous TID   promethazine 12.5 mg Intravenous Q6H       Continuous Infusions:       . sodium chloride 150 mL/hr at 05/05/13 0616            Physical Exam:       VITAL SIGNS PHYSICAL EXAM   Filed Vitals:    05/05/13 0738   BP: 134/75   Pulse: 86   Temp: 98.1 F (36.7 C)   Resp: 16   SpO2: 95%     Temp (24hrs), Avg:98.1 F (36.7 C), Min:97.3 F (36.3 C), Max:98.6 F (37 C)      Telemetry: Reviewed : ST 100s with frequent PVCs    No intake or output data in the 24 hours ending 05/05/13 1024 Physical Exam  General: sleepy, breathing comfortable, no acute distress  Head: normocephalic  Eyes: EOM's intact  Cardiovascular: regular rate and rhythm, S1, S2, no S3, no S4, no murmurs, rubs or gallops  Neck: no carotid bruits or JVD  Lungs: clear to auscultation bilateraly, without wheezing, rhonchi, or rales  Abdomen: soft, non-tender, non-distended; no palpable masses,  normoactive bowel sounds  Extremities: no edema  Pulse: equal pulses, 4/4 symmetric  Neurological: Alert and oriented X3, drowsy         Labs:       Lab 05/04/13  1610 05/03/13 1756 05/03/13 1246   CK -- -- --   TROPI 0.04 0.04 0.02   TROPT -- -- --   CKMBINDEX -- -- --     No results found for this basename: DIG in the last 168 hours  No results found for this basename: CHOL:3,TRIG:3,HDL:3,LDL:3 in the last 168 hours    Lab 05/01/13 2040   BILITOTAL 0.4   BILIDIRECT --   PROT 7.4   ALB 4.2   ALT 21   AST 14       Lab 05/05/13 0446   MG 1.8     No results found for this basename: PT,INR,PTT in the last 168 hours    Lab 05/05/13 0827 05/04/13 0211 05/03/13 0311   WBC 8.84 9.57 14.87*   HGB 11.8* 12.0* 12.8*   HCT 35.8* 37.0* 38.2*   PLT 229 273 289       Lab 05/05/13 0827 05/04/13 0211 05/03/13 0311   NA 145 143 142   K 3.4* 4.3 4.4   CL 110* 110* 110*   CO2 26 25 24    BUN 10.0 14.0 18.0   CREAT 1.1 1.2 1.3   EGFR >60.0 >60.0 >60.0   GLU 158* 208* 179*   CA 8.7 8.7 9.1     No results  found for this basename: TSH,FREET3,FREET4 in the last 168 hours    .  No results found for this basename: BNP        Imaging:   Radiological Procedure reviewed.        Signed by: Dreama Saa, [place]      [facility]  NP Spectralink [phone] (8am-5pm)  MD Spectralink [phone] or [phone] (8am-5pm)  Arrhythmia Spectralink [phone] (8am-4:30pm)  After hours, non urgent consult line [phone]  After Hours, urgent consults [phone]

## 2013-05-05 NOTE — Progress Notes (Signed)
[facility] Attending Note    This patient was evaluated on rounds with Dr. Hunt Oris.  Physical exam directly observed and duplicated.  I agree with assessment and plan as outlined.      Symptomatically w/o much improvement today. Agree with ongoing observation, IVF and symptomatic control.     Patient Active Problem List   Diagnosis   . GERD (gastroesophageal reflux disease)   . Gastritis without bleeding   . Type 2 diabetes, uncontrolled, with neuropathy   . Nausea & vomiting   . Benign essential hypertension   . Diabetic gastroparesis   . Abdominal pain   . Gastroparesis   . Nausea vomiting and diarrhea

## 2013-05-05 NOTE — Plan of Care (Signed)
Problem: Altered GI Function  Goal: Fluid and electrolyte balance are achieved/maintained  Outcome: Progressing  Pt denied having any pain, no PRN's given overnight. No c/o n/v. Pt had some liquids overnight and tolerated it well. Pt still with IV fluids for hydration.  Intervention: continue to monitor abd pain, nausea, and vomiting, and intervene for changes following plan of care.

## 2013-05-06 LAB — BASIC METABOLIC PANEL
BUN: 9 mg/dL (ref 9.0–21.0)
CO2: 29 mEq/L (ref 22–29)
Calcium: 8.4 mg/dL — ABNORMAL LOW (ref 8.5–10.5)
Chloride: 106 mEq/L (ref 98–107)
Creatinine: 1 mg/dL (ref 0.7–1.3)
Glucose: 151 mg/dL — ABNORMAL HIGH (ref 70–100)
Potassium: 3.5 mEq/L (ref 3.5–5.1)
Sodium: 142 mEq/L (ref 136–145)

## 2013-05-06 LAB — GLUCOSE WHOLE BLOOD - POCT
Whole Blood Glucose POCT: 149 mg/dL — ABNORMAL HIGH (ref 70–100)
Whole Blood Glucose POCT: 168 mg/dL — ABNORMAL HIGH (ref 70–100)
Whole Blood Glucose POCT: 126 mg/dL — ABNORMAL HIGH (ref 70–100)
Whole Blood Glucose POCT: 232 mg/dL — ABNORMAL HIGH (ref 70–100)

## 2013-05-06 LAB — GFR: EGFR: >60

## 2013-05-06 MED ORDER — HYDRALAZINE HCL 20 MG/ML IJ SOLN
10.0000 mg | Freq: Four times a day (QID) | INTRAMUSCULAR | Status: DC | PRN
Start: 2013-05-06 — End: 2013-05-09
  Administered 2013-05-06 – 2013-05-07 (×2): 10 mg via INTRAVENOUS
  Filled 2013-05-06 (×2): qty 1

## 2013-05-06 MED ORDER — PANTOPRAZOLE SODIUM 40 MG IV SOLR
40.0000 mg | Freq: Every day | INTRAVENOUS | Status: DC
Start: 2013-05-06 — End: 2013-05-09
  Administered 2013-05-06 – 2013-05-09 (×4): 40 mg via INTRAVENOUS
  Filled 2013-05-06 (×4): qty 40

## 2013-05-06 NOTE — Plan of Care (Signed)
SR,ST on tele. Elevated SBP 180s. Prn labetalol given. Pt c/o abdominal pain. PRN Dilaudid and Ativan given. Pt continues to have frequent emesis. Continuing scheduled Phenergan. Hydrating with NS at 150. PICC to RUE. Will continue to monitor.

## 2013-05-06 NOTE — Progress Notes (Addendum)
[place] HEART PROGRESS NOTE  [facility]      Date Time: 05/06/2013 8:53 AM  Patient Name: [name], [name]  Medical Record #:  [id number]  Account#:  [id number]  Admission Date:  05/01/2013         Patient Active Problem List   Diagnosis   . GERD (gastroesophageal reflux disease)   . Gastritis without bleeding   . Type 2 diabetes, uncontrolled, with neuropathy   . Nausea & vomiting   . Benign essential hypertension   . Diabetic gastroparesis   . Abdominal pain   . Gastroparesis   . Nausea vomiting and diarrhea       Subjective:   Denies chest pain, SOB or palpitations. Still having frequent emesis    Assessment:   Consulted for Ventricular tachycardia (50-beat run) on 05/03/2013, possible causes include transient QT prolongation secondary to frequent reglan/phenergan use, metabolic derangements from dehydration/N/V/diarrhea, or ischemia.   Exertional dyspnea   Insulin dependent diabetes (diagnosed at age 14)   Admitted 05/01/13 with N/V/diarrhea secondary to gastroparesis flare/gastroenteritis  Troponins x 3 negative  Tip of PICC in lower SVC  Echo: nl EF, no wall motion abnormalities    Recommendations:   No further VT or PVCs on tele overnight. Now he is sinus tach in low 100s  Maintain K>4, Mg>2  EKG today  Continue hydrating  Plan for inpatient ischemic evaluation (likely MPI) after his GI issues are quiescent  Would change the PRN Labetalol to PRN hydralazine for SBP over .     Patient seen and examined, my assessment and plan as above. Still very nauseous. Normal heart and lung exam. Will plan likely MPI when GI issues resolved.      Medications:      Scheduled Meds:           heparin (porcine) 5,000 Units Subcutaneous Q8H SCH   insulin glargine 22 Units Subcutaneous QHS   [COMPLETED] magnesium sulfate 1 g Intravenous Q1H   metoclopramide 10 mg Intravenous TID   potassium chloride 20 mEq Oral Once   promethazine 12.5 mg Intravenous Q6H       Continuous Infusions:       . sodium chloride 150 mL/hr at  05/05/13 2030            Physical Exam:       VITAL SIGNS PHYSICAL EXAM   Filed Vitals:    05/06/13 0727   BP: 184/97   Pulse: 94   Temp: 97.9 F (36.6 C)   Resp: 16   SpO2: 96%     Temp (24hrs), Avg:98.4 F (36.9 C), Min:97.9 F (36.6 C), Max:99.1 F (37.3 C)      Telemetry: Reviewed : ST 100s     Intake/Output Summary (Last 24 hours) at 05/06/13 0853  Last data filed at 05/06/13 0100   Gross per 24 hour   Intake      0 ml   Output   1475 ml   Net  -1475 ml    Physical Exam  General: sleepy, breathing comfortable, no acute distress  Head: normocephalic  Eyes: EOM's intact  Cardiovascular: regular rate and rhythm, S1, S2, no S3, no S4, no murmurs, rubs or gallops  Neck: no carotid bruits or JVD  Lungs: clear to auscultation bilateraly, without wheezing, rhonchi, or rales  Abdomen: soft, non-tender, non-distended; no palpable masses,  normoactive bowel sounds  Extremities: no edema  Pulse: equal pulses, 4/4 symmetric  Neurological: Alert and oriented X3, drowsy  Labs:       Lab 05/04/13 0212 05/03/13 1756 05/03/13 1246   CK -- -- --   TROPI 0.04 0.04 0.02   TROPT -- -- --   CKMBINDEX -- -- --     No results found for this basename: DIG in the last 168 hours  No results found for this basename: CHOL:3,TRIG:3,HDL:3,LDL:3 in the last 168 hours    Lab 05/01/13 2040   BILITOTAL 0.4   BILIDIRECT --   PROT 7.4   ALB 4.2   ALT 21   AST 14       Lab 05/05/13 0446   MG 1.8     No results found for this basename: PT,INR,PTT in the last 168 hours    Lab 05/05/13 0827 05/04/13 0211 05/03/13 0311   WBC 8.84 9.57 14.87*   HGB 11.8* 12.0* 12.8*   HCT 35.8* 37.0* 38.2*   PLT 229 273 289       Lab 05/06/13 0617 05/05/13 0827 05/04/13 0211   NA 142 145 143   K 3.5 3.4* 4.3   CL 106 110* 110*   CO2 29 26 25    BUN 9.0 10.0 14.0   CREAT 1.0 1.1 1.2   EGFR >60.0 >60.0 >60.0   GLU 151* 158* 208*   CA 8.4* 8.7 8.7     No results found for this basename: TSH,FREET3,FREET4 in the last 168 hours    .  No results found for this  basename: BNP        Imaging:   Radiological Procedure reviewed.        Signed by: Dreama Saa, [place]      [facility]  NP Spectralink [phone] (8am-5pm)  MD Spectralink [phone] or [phone] (8am-5pm)  Arrhythmia Spectralink [phone] (8am-4:30pm)  After hours, non urgent consult line [phone]  After Hours, urgent consults [phone]

## 2013-05-06 NOTE — Progress Notes (Signed)
PROGRESS NOTE    Date Time: 05/06/2013 12:41 PM  Patient Name: [name],[name]      Assessment:     Patient Active Problem List     Diagnosis  Date Noted    .  Gastroparesis  02/16/2013    .  Abdominal pain  02/09/2013    .  Diabetic gastroparesis  08/22/2012    .  Benign essential hypertension  08/05/2012    .  Type 2 diabetes, uncontrolled, with neuropathy  06/27/2012    .  Nausea & vomiting  06/27/2012    .  GERD (gastroesophageal reflux disease)  06/17/2012    .  Gastritis without bleeding  06/17/2012      38 y.o. male male with a PMH significant for Gastroparesis (multiple prior admissions), uncontrolled IDDM, and HTN who presents to the hospital with intractable nausea and vomiting,watery diarrhea, likely secondary to gastroparesis flare. Minimally improved, still unable to tolerate PO    Plan:   1). Nausea and Vomiting, likely 2/2 Gastroparesis flare -continued symptoms  - Continue current regimen -Phenergan 12.5 mg IV q6h scheduled , Reglan 10 mg IV TID scheduled   -IV Protonix added today  -  Carafate 1 gm PO QID when tolerating PO, will hold on re-adding this today  - ativan 1 mg IV prn and dilaudid 1 mg q4hrs prn pain, required 5 mg Dilaudid and 3 mg Ativan over past 24 hrs  -Continue  IVFs @ 150 cc/hr  -Will keep NPO as patient not tolerating even sips over past 24 hours  -monitor electrolytes, replete as needed  -RUQ US shows s/p cholecystectomy, no gallstones. Small hyperechogenicity w/in right liver lobe, to be followed up as outpatient    2). Vtach-could be related to prolonged QT in setting of reglan and erythromycin. No further episodes of sustained Vtach on telemetry since 05/02/13  -Erythromycin discontinued  -EKG continues to have QTc <500 ms   -echo with normal EF 55-65%, mild concentric LVH, dilated RV but normal RV function  -troponins negative X 3  -Continuous O2 night time sat without desaturation, less likely OSA  -appreciate cardiology recs, ischemic evaluation as outpatient    3). AKI,  likely pre-renal in setting of dehydration -improved  -Creatinine 1.0  - IVFs as noted above   - will monitor BMP    4). Diarrhea--resolved  - suspect this may be 2/2 gastroenteritis   - IVFs as noted above     5). IDDM, uncontrolled: BS 150-190's  - continue 22 units lantus qhs     6). HTN   - Labetalol PRN   - hold home PO meds until tolerating PO (Metoprolol, Amlodipine, Lisinopril)    Code: FULL   Diet: NPO, IVFs  PPX: SCDs, SQH     Discussed with Dr. Rogelia Boga    Dispo: Continue inpatient treatment for persistent nausea/vomit, not tolerating PO.      Subjective:   No acute events overnight, no episodes of sustained Vtach noted on monitor. Patient reports persistent pain and not tolerating sips of gingerale. Continues to have frequent emesis.    Medications:     Current Facility-Administered Medications   Medication Dose Route Frequency   . heparin (porcine)  5,000 Units Subcutaneous Q8H SCH   . insulin glargine  22 Units Subcutaneous QHS   . [COMPLETED] magnesium sulfate  1 g Intravenous Q1H   . metoclopramide  10 mg Intravenous TID   . pantoprazole  40 mg Intravenous Daily   . potassium chloride  20 mEq Oral Once   . promethazine  12.5 mg Intravenous Q6H       Physical Exam:     Filed Vitals:    05/06/13 0727   BP: 184/97   Pulse: 94   Temp: 97.9 F (36.6 C)   Resp: 16   SpO2: 96%       Intake and Output Summary (Last 24 hours) at Date Time    Intake/Output Summary (Last 24 hours) at 05/06/13 1241  Last data filed at 05/06/13 0100   Gross per 24 hour   Intake      0 ml   Output   1475 ml   Net  -1475 ml     General: awake, alert, oriented, resting, appears uncomfortable  HEENT: moist, mucous membranes  Cardiovascular: regular rate and rhythm, no murmurs, rubs or gallops   Lungs: clear to auscultation bilaterally, without wheezing, rhonchi, or rales   Abdomen: soft, diffuse tenderness  Extremities: no clubbing, cyanosis, edema, 2+ dp pulses  Skin: no rashes or lesions noted        Labs:     Results     Procedure  Component Value Units Date/Time    Glucose Whole Blood - POCT [[id number]]  (Abnormal) Collected:05/06/13 1212     POCT - Glucose Whole blood 168 (H) mg/dL WUXLKGM:02/13/70 5366    Basic Metabolic Panel [[id number]]  (Abnormal) Collected:05/06/13 0617    Specimen Information:Blood Updated:05/06/13 0656     Glucose 151 (H) mg/dL      BUN 9.0 mg/dL      Creatinine 1.0 mg/dL      CALCIUM 8.4 ([name]) mg/dL      Sodium 956 mEq/[name]      Potassium 3.5 mEq/[name]      Chloride 106 mEq/[name]      CO2 29 mEq/[name]     GFR [[id number]] Collected:05/06/13 0617     EGFR >60.0 Updated:05/06/13 0656    Glucose Whole Blood - POCT [[id number]]  (Abnormal) Collected:05/06/13 0639     POCT - Glucose Whole blood 149 (H) mg/dL AYTKZSW:10/93/23 5573    Glucose Whole Blood - POCT [[id number]]  (Abnormal) Collected:05/05/13 2100     POCT - Glucose Whole blood 160 (H) mg/dL WCBJSEG:31/51/76 1607    Glucose Whole Blood - POCT [[id number]]  (Abnormal) Collected:05/05/13 1620     POCT - Glucose Whole blood 185 (H) mg/dL WNIOEVO:35/00/93 8182        Rads:   Radiological Procedure reviewed.    Signed by: Aquilla Hacker

## 2013-05-06 NOTE — Plan of Care (Signed)
Pt is SR on tele w/ one episode of tachy. VSS on RA. Pt was not nauseated from this afternoon.stats feels better. Asking for some food. Clear liquid diet is starting by Phone order,by DR Kelby Aline. NS infusing on 129ml/h. Will continue to monitor.

## 2013-05-07 LAB — CBC AND DIFFERENTIAL
Basophils Absolute Automated: 0.02 10*3/uL (ref 0.00–0.20)
Basophils Automated: 0 %
Eosinophils Absolute Automated: 0.04 10*3/uL (ref 0.00–0.70)
Eosinophils Automated: 1 %
Hematocrit: 37 % — ABNORMAL LOW (ref 42.0–52.0)
Hgb: 12.3 g/dL — ABNORMAL LOW (ref 13.0–17.0)
Immature Granulocytes Absolute: 0.03 10*3/uL
Immature Granulocytes: 0 %
Lymphocytes Absolute Automated: 1.46 10*3/uL (ref 0.50–4.40)
Lymphocytes Automated: 20 %
MCH: 26.3 pg — ABNORMAL LOW (ref 28.0–32.0)
MCHC: 33.2 g/dL (ref 32.0–36.0)
MCV: 79.1 fL — ABNORMAL LOW (ref 80.0–100.0)
MPV: 9.7 fL (ref 9.4–12.3)
Monocytes Absolute Automated: 0.49 10*3/uL (ref 0.00–1.20)
Monocytes: 7 %
Neutrophils Absolute: 5.13 10*3/uL (ref 1.80–8.10)
Neutrophils: 72 %
Nucleated RBC: 0 /100 WBC (ref 0–1)
Platelets: 233 10*3/uL (ref 140–400)
RBC: 4.68 10*6/uL — ABNORMAL LOW (ref 4.70–6.00)
RDW: 12 % (ref 12–15)
WBC: 7.17 10*3/uL (ref 3.50–10.80)

## 2013-05-07 LAB — ECG 12-LEAD
Atrial Rate: 87 {beats}/min
P Axis: 53 degrees
P-R Interval: 158 ms
Q-T Interval: 396 ms
QRS Duration: 90 ms
QTC Calculation (Bezet): 476 ms
R Axis: 30 degrees
T Axis: 46 degrees
Ventricular Rate: 87 {beats}/min

## 2013-05-07 LAB — BASIC METABOLIC PANEL
BUN: 9 mg/dL (ref 9.0–21.0)
CO2: 26 mEq/L (ref 22–29)
Calcium: 8.6 mg/dL (ref 8.5–10.5)
Chloride: 105 mEq/L (ref 98–107)
Creatinine: 1 mg/dL (ref 0.7–1.3)
Glucose: 216 mg/dL — ABNORMAL HIGH (ref 70–100)
Potassium: 3.7 mEq/L (ref 3.5–5.1)
Sodium: 140 mEq/L (ref 136–145)

## 2013-05-07 LAB — GLUCOSE WHOLE BLOOD - POCT
Whole Blood Glucose POCT: 206 mg/dL — ABNORMAL HIGH (ref 70–100)
Whole Blood Glucose POCT: 185 mg/dL — ABNORMAL HIGH (ref 70–100)
Whole Blood Glucose POCT: 151 mg/dL — ABNORMAL HIGH (ref 70–100)
Whole Blood Glucose POCT: 137 mg/dL — ABNORMAL HIGH (ref 70–100)

## 2013-05-07 LAB — GFR: EGFR: >60

## 2013-05-07 NOTE — Progress Notes (Signed)
PROGRESS NOTE    Date Time: 05/07/2013 11:54 AM  Patient Name: [name],[name]      Assessment:     Patient Active Problem List     Diagnosis  Date Noted    .  Gastroparesis  02/16/2013    .  Abdominal pain  02/09/2013    .  Diabetic gastroparesis  08/22/2012    .  Benign essential hypertension  08/05/2012    .  Type 2 diabetes, uncontrolled, with neuropathy  06/27/2012    .  Nausea & vomiting  06/27/2012    .  GERD (gastroesophageal reflux disease)  06/17/2012    .  Gastritis without bleeding  06/17/2012      38 y.o. male male with a PMH significant for Gastroparesis (multiple prior admissions), uncontrolled IDDM, and HTN who presents to the hospital with intractable nausea and vomiting,watery diarrhea, likely secondary to gastroparesis flare. Minimally improved, still unable to tolerate PO    Plan:   1). Nausea and Vomiting, likely 2/2 Gastroparesis flare -continued symptoms  - Continue current regimen -Phenergan 12.5 mg IV q6h scheduled , Reglan 10 mg IV TID scheduled   -IV Protonix daily  -  Carafate 1 gm PO QID when tolerating PO, will hold on re-adding this today  - ativan 1 mg IV prn and dilaudid 1 mg q4hrs prn pain, required 4 mg Dilaudid and 3 mg Ativan over past 24 hrs  -Continue  IVFs @ 150 cc/hr  -Will keep NPO as patient not tolerating even sips over past 24 hours  -monitor electrolytes, replete as needed  -RUQ US shows s/p cholecystectomy, no gallstones. Small hyperechogenicity w/in right liver lobe, to be followed up as outpatient    2). Vtach-could be related to prolonged QT in setting of reglan and erythromycin. No further episodes of sustained Vtach on telemetry since 05/02/13  -Erythromycin discontinued  -EKG from yesterday with QTc <500 ms   -echo with normal EF 55-65%, mild concentric LVH, dilated RV but normal RV function  -troponins negative X 3  -Continuous O2 night time sat without desaturation, less likely OSA  -appreciate cardiology recs, ischemic evaluation as outpatient    3). AKI, likely  pre-renal in setting of dehydration -improved  -Creatinine 1.0  - IVFs as noted above   - will monitor BMP    4). Diarrhea--resolved  - suspect this may be 2/2 gastroenteritis   - IVFs as noted above     5). IDDM, uncontrolled: BS 150-190's  - continue 22 units lantus qhs     6). HTN   - Labetalol PRN   - hold home PO meds until tolerating PO (Metoprolol, Amlodipine, Lisinopril)    Code: FULL   Diet: NPO, IVFs  PPX: SCDs, SQH     Discussed with Dr. Rogelia Boga    Dispo: Continue inpatient treatment for persistent nausea/vomit, not tolerating PO.      Subjective:   No acute events overnight, no episodes of sustained Vtach noted on monitor. Patient was NPO for the am and feeling better yesterday. He wanted to try liquids but this made the pain and nausea return.    Medications:     Current Facility-Administered Medications   Medication Dose Route Frequency   . heparin (porcine)  5,000 Units Subcutaneous Q8H SCH   . insulin glargine  22 Units Subcutaneous QHS   . metoclopramide  10 mg Intravenous TID   . pantoprazole  40 mg Intravenous Daily   . potassium chloride  20 mEq Oral Once   .  promethazine  12.5 mg Intravenous Q6H       Physical Exam:     Filed Vitals:    05/07/13 0801   BP: 170/89   Pulse: 102   Temp: 98.8 F (37.1 C)   Resp: 18   SpO2: 95%       Intake and Output Summary (Last 24 hours) at Date Time    Intake/Output Summary (Last 24 hours) at 05/07/13 1154  Last data filed at 05/07/13 0500   Gross per 24 hour   Intake      0 ml   Output    500 ml   Net   -500 ml     General: awake, alert, oriented, resting, appears uncomfortable, vomiting clear liquid  HEENT: moist, mucous membranes  Cardiovascular: regular rate and rhythm, no murmurs, rubs or gallops   Lungs: clear to auscultation bilaterally, without wheezing, rhonchi, or rales   Abdomen: soft, diffuse tenderness  Extremities: no clubbing, cyanosis, edema, 2+ dp pulses  Skin: no rashes or lesions noted        Labs:     Results     Procedure Component Value  Units Date/Time    Glucose Whole Blood - POCT [[id number]]  (Abnormal) Collected:05/07/13 1114     POCT - Glucose Whole blood 185 (H) mg/dL [id number]:14/78/29 5621    Glucose Whole Blood - POCT [[id number]]  (Abnormal) Collected:05/07/13 0709     POCT - Glucose Whole blood 206 (H) mg/dL NGEXBMW:41/32/44 0102    Basic Metabolic Panel [[id number]]  (Abnormal) Collected:05/07/13 0535    Specimen Information:Blood Updated:05/07/13 0628     Glucose 216 (H) mg/dL      BUN 9.0 mg/dL      Creatinine 1.0 mg/dL      CALCIUM 8.6 mg/dL      Sodium 347 mEq/[name]      Potassium 3.7 mEq/[name]      Chloride 105 mEq/[name]      CO2 26 mEq/[name]     GFR [[id number]] Collected:05/07/13 0535     EGFR >60.0 Updated:05/07/13 0628    CBC and differential [[id number]]  (Abnormal) Collected:05/07/13 0534    Specimen Information:Blood / Blood Updated:05/07/13 0613     WBC 7.17 x10 3/uL      RBC 4.68 ([name]) x10 6/uL      Hgb 12.3 ([name]) g/dL      Hematocrit 88.4 ([name]) %      MCV 79.1 ([name]) fL      MCH 26.3 ([name]) pg      MCHC 33.2 g/dL      RDW 12 %      Platelets 233 x10 3/uL      MPV 9.7 fL      Neutrophils 72 %      Lymphocytes Automated 20 %      Monocytes 7 %      Eosinophils Automated 1 %      Basophils Automated 0 %      Immature Granulocyte 0 %      Nucleated RBC 0 /100 WBC      Neutrophils Absolute 5.13 x10 3/uL      Abs Lymph Automated 1.46 x10 3/uL      Abs Mono Automated 0.49 x10 3/uL      Abs Eos Automated 0.04 x10 3/uL      Absolute Baso Automated 0.02 x10 3/uL      Absolute Immature Granulocyte 0.03 x10 3/uL     Glucose Whole Blood - POCT [[id number]]  (Abnormal) Collected:05/06/13 2047  POCT - Glucose Whole blood 232 (H) mg/dL ZOXWRUE:45/40/98 1191    Glucose Whole Blood - POCT [[id number]]  (Abnormal) Collected:05/06/13 1640     POCT - Glucose Whole blood 126 (H) mg/dL HYQMVHQ:46/96/29 5284    Glucose Whole Blood - POCT [[id number]]  (Abnormal) Collected:05/06/13 1212     POCT - Glucose Whole blood 168 (H) mg/dL VOZDGUY:40/34/74 2595        Rads:   Radiological  Procedure reviewed.    Signed by: Aquilla Hacker

## 2013-05-07 NOTE — Plan of Care (Signed)
Tele: SR/ST 70-low 100's    VS: VSS BP came down in afternoon, high this AM due to throwing up. Pain 10/10 abdomen and after medication comes down to a 4/10.    Assessment: N/V throughout the day, Strict NPO for complete bowel rest, PRN dilaudid and ativan given as well as scheduled phenergan     Plan: Bowel rest, NPO, IVF, pain management      Complaints/Needs:  None at this time.

## 2013-05-07 NOTE — Progress Notes (Signed)
[facility] Attending Note    This patient was evaluated on rounds with Dr. Hunt Oris.  Physical exam directly observed and duplicated.  I agree with assessment and plan as outlined.      Was feeling better yesterday while NPO, then had clears in the afternoon w/ return of pain and nausea. Vomiting during our exam this morning. Agree with complete bowel rest for the next day and ongoing symptomatic management. BP high (off all home PO meds currently due to nausea); will continue to follow and restart home BP meds tomorrow when taking PO.      Patient Active Problem List   Diagnosis   . GERD (gastroesophageal reflux disease)   . Gastritis without bleeding   . Type 2 diabetes, uncontrolled, with neuropathy   . Nausea & vomiting   . Benign essential hypertension   . Diabetic gastroparesis   . Abdominal pain   . Gastroparesis   . Nausea vomiting and diarrhea

## 2013-05-07 NOTE — Plan of Care (Signed)
Pt sleeping, states his abdomen pain is a 1/10 at this time. Strict NPO, pt aware. NS infusing @ 150 cc/hr. Denies any N/V at this time.

## 2013-05-07 NOTE — Plan of Care (Signed)
Spoke with resident team, pt is going to be strict NPO for complete bowel rest. NO Insulin coverage at meal times since pt is NPO.

## 2013-05-07 NOTE — Progress Notes (Signed)
[facility] HEART PROGRESS NOTE  [facility]      Date Time: 05/07/2013 9:15 AM  Patient Name: [name], [name]  Medical Record #:  [id number]  Account#:  [id number]  Admission Date:  05/01/2013         Patient Active Problem List   Diagnosis   . GERD (gastroesophageal reflux disease)   . Gastritis without bleeding   . Type 2 diabetes, uncontrolled, with neuropathy   . Nausea & vomiting   . Benign essential hypertension   . Diabetic gastroparesis   . Abdominal pain   . Gastroparesis   . Nausea vomiting and diarrhea       Subjective:   Denies chest pain, SOB or palpitations. Still GI upset, unable to take po    Assessment:   Consulted for Ventricular tachycardia (50-beat run) on 05/03/2013, possible causes include transient QT prolongation secondary to frequent reglan/phenergan use, metabolic derangements from dehydration/N/V/diarrhea, or ischemia.   Exertional dyspnea   Insulin dependent diabetes (diagnosed at age 41)   Admitted 05/01/13 with N/V/diarrhea secondary to gastroparesis flare/gastroenteritis   Troponins x 3 negative   Tip of PICC in lower SVC   Echo: nl EF, no wall motion abnormalities        Recommendations:    No further VT on tele   Will plan to carry out a nuclear imaging study when patient is stable from a GI view - continue on telemetry.        Medications:      Scheduled Meds:         heparin (porcine) 5,000 Units Subcutaneous Q8H SCH   insulin glargine 22 Units Subcutaneous QHS   metoclopramide 10 mg Intravenous TID   pantoprazole 40 mg Intravenous Daily   potassium chloride 20 mEq Oral Once   promethazine 12.5 mg Intravenous Q6H       Continuous Infusions:       . sodium chloride 150 mL/hr at 05/07/13 0546            Physical Exam:       VITAL SIGNS PHYSICAL EXAM   Filed Vitals:    05/07/13 0801   BP: 170/89   Pulse: 102   Temp: 98.8 F (37.1 C)   Resp: 18   SpO2: 95%     Temp (24hrs), Avg:98.3 F (36.8 C), Min:97.9 F (36.6 C), Max:98.8 F (37.1 C)      Telemetry: Reviewed no  changes      Intake/Output Summary (Last 24 hours) at 05/07/13 0915  Last data filed at 05/07/13 0500   Gross per 24 hour   Intake      0 ml   Output    500 ml   Net   -500 ml    Physical Exam  General: awake, alert, breathing comfortable, no acute distress  Head: normocephalic  Eyes: EOM's intact  Cardiovascular: regular rate and rhythm, S1, S2, no S3, no S4, no murmurs, rubs or gallops  Neck: no carotid bruits or JVD  Lungs: clear to auscultation bilateraly, without wheezing, rhonchi, or rales  Abdomen: soft, non-tender, non-distended; no palpable masses,  normoactive bowel sounds  Extremities: no edema  Pulse: equal pulses, 4/4 symmetric  Neurological: Alert and oriented X3, mood and affect normal         Labs:     Lab 05/04/13 0212 05/03/13 1756 05/03/13 1246   CK -- -- --   TROPI 0.04 0.04 0.02   TROPT -- -- --   CKMBINDEX -- -- --  No results found for this basename: DIG in the last 168 hours  No results found for this basename: CHOL:3,TRIG:3,HDL:3,LDL:3 in the last 168 hours    Lab 05/01/13 2040   BILITOTAL 0.4   BILIDIRECT --   PROT 7.4   ALB 4.2   ALT 21   AST 14       Lab 05/05/13 0446   MG 1.8     No results found for this basename: PT,INR,PTT in the last 168 hours    Lab 05/07/13 0534 05/05/13 0827 05/04/13 0211   WBC 7.17 8.84 9.57   HGB 12.3* 11.8* 12.0*   HCT 37.0* 35.8* 37.0*   PLT 233 229 273       Lab 05/07/13 0535 05/06/13 0617 05/05/13 0827   NA 140 142 145   K 3.7 3.5 3.4*   CL 105 106 110*   CO2 26 29 26    BUN 9.0 9.0 10.0   CREAT 1.0 1.0 1.1   EGFR >60.0 >60.0 >60.0   GLU 216* 151* 158*   CA 8.6 8.4* 8.7     No results found for this basename: TSH,FREET3,FREET4 in the last 168 hours    .  No results found for this basename: BNP        Imaging:   Radiological Procedure reviewed.        Signed by: Jonetta Speak, MD      [facility]  NP Spectralink [phone] (8am-5pm)  MD Spectralink [phone] or [phone] (8am-5pm)  Arrhythmia Spectralink [phone] (8am-4:30pm)  After hours, non urgent consult  line [phone]  After Hours, urgent consults [phone]

## 2013-05-07 NOTE — Plan of Care (Addendum)
SR,ST on tele. Pt states that hes been feeling better. Pt had apple juice, beef broth, and pop sickles, then later had one episode of emesis. Continuing IV hydration and prn pain meds. Will continue to monitor.     0130- Pt stating that he is uncomfortable. Not feeling well again. Pt stated everything was getting better but now starting to feel uncomfortable and sick again. PRN Ativan given.

## 2013-05-08 ENCOUNTER — Inpatient Hospital Stay: Payer: BC Managed Care – PPO

## 2013-05-08 LAB — GLUCOSE WHOLE BLOOD - POCT
Whole Blood Glucose POCT: 146 mg/dL — ABNORMAL HIGH (ref 70–100)
Whole Blood Glucose POCT: 142 mg/dL — ABNORMAL HIGH (ref 70–100)
Whole Blood Glucose POCT: 223 mg/dL — ABNORMAL HIGH (ref 70–100)
Whole Blood Glucose POCT: 203 mg/dL — ABNORMAL HIGH (ref 70–100)

## 2013-05-08 LAB — BASIC METABOLIC PANEL
BUN: 10 mg/dL (ref 9.0–21.0)
CO2: 27 mEq/L (ref 22–29)
Calcium: 8.2 mg/dL — ABNORMAL LOW (ref 8.5–10.5)
Chloride: 108 mEq/L — ABNORMAL HIGH (ref 98–107)
Creatinine: 0.9 mg/dL (ref 0.7–1.3)
Glucose: 147 mg/dL — ABNORMAL HIGH (ref 70–100)
Potassium: 3.5 mEq/L (ref 3.5–5.1)
Sodium: 140 mEq/L (ref 136–145)

## 2013-05-08 LAB — GFR: EGFR: >60

## 2013-05-08 MED ORDER — PROMETHAZINE HCL 25 MG PO TABS
12.5000 mg | ORAL_TABLET | Freq: Four times a day (QID) | ORAL | Status: DC
Start: 2013-05-08 — End: 2013-05-09
  Administered 2013-05-08 – 2013-05-09 (×3): 12.5 mg via ORAL
  Filled 2013-05-08 (×3): qty 1

## 2013-05-08 MED ORDER — METOPROLOL TARTRATE 25 MG PO TABS
25.0000 mg | ORAL_TABLET | Freq: Once | ORAL | Status: AC
Start: 2013-05-08 — End: 2013-05-08
  Administered 2013-05-08: 25 mg via ORAL
  Filled 2013-05-08: qty 1

## 2013-05-08 MED ORDER — METOPROLOL TARTRATE 25 MG PO TABS
100.0000 mg | ORAL_TABLET | Freq: Once | ORAL | Status: AC
Start: 2013-05-08 — End: 2013-05-08
  Administered 2013-05-08: 100 mg via ORAL
  Filled 2013-05-08: qty 4

## 2013-05-08 MED ORDER — NITROGLYCERIN 0.4 MG SL SUBL
0.4000 mg | SUBLINGUAL_TABLET | Freq: Once | SUBLINGUAL | Status: AC
Start: 2013-05-08 — End: 2013-05-08
  Administered 2013-05-08: 0.4 mg via SUBLINGUAL

## 2013-05-08 MED ORDER — METOCLOPRAMIDE HCL 10 MG PO TABS
10.0000 mg | ORAL_TABLET | Freq: Three times a day (TID) | ORAL | Status: DC
Start: 2013-05-08 — End: 2013-05-08

## 2013-05-08 MED ORDER — METOPROLOL TARTRATE 1 MG/ML IV SOLN
5.0000 mg | Freq: Once | INTRAVENOUS | Status: AC
Start: 2013-05-08 — End: 2013-05-08
  Administered 2013-05-08: 5 mg via INTRAVENOUS

## 2013-05-08 MED ORDER — IODIXANOL 320 MG/ML IV SOLN
120.0000 mL | Freq: Once | INTRAVENOUS | Status: AC | PRN
Start: 2013-05-08 — End: 2013-05-08
  Administered 2013-05-08: 120 mL via INTRAVENOUS

## 2013-05-08 MED ORDER — METOPROLOL TARTRATE 1 MG/ML IV SOLN
INTRAVENOUS | Status: AC
Start: 2013-05-08 — End: ?
  Filled 2013-05-08: qty 5

## 2013-05-08 MED ORDER — METOCLOPRAMIDE HCL 10 MG PO TABS
10.0000 mg | ORAL_TABLET | Freq: Three times a day (TID) | ORAL | Status: DC
Start: 2013-05-08 — End: 2013-05-09
  Administered 2013-05-08 – 2013-05-09 (×2): 10 mg via ORAL
  Filled 2013-05-08 (×2): qty 1

## 2013-05-08 NOTE — Progress Notes (Signed)
[facility] HEART PROGRESS NOTE  [facility]      Date Time: 05/08/2013 6:25 PM  Patient Name: [name], [name]  Medical Record #:  [id number]  Account#:  [id number]  Admission Date:  05/01/2013         Patient Active Problem List   Diagnosis   . GERD (gastroesophageal reflux disease)   . Gastritis without bleeding   . Type 2 diabetes, uncontrolled, with neuropathy   . Nausea & vomiting   . Benign essential hypertension   . Diabetic gastroparesis   . Abdominal pain   . Gastroparesis   . Nausea vomiting and diarrhea       Subjective:   Denies chest pain, SOB or palpitations. We performed a coronary CTA today which was unremarkable and did not show any evidence of plaque.   Has not had any arrhythmia on the monitor.       Assessment:   Consulted for Ventricular tachycardia (50-beat run) on 05/03/2013, possible causes include transient QT prolongation secondary to frequent reglan/phenergan use, metabolic derangements from dehydration/N/V/diarrhea, or ischemia.   Exertional dyspnea   Insulin dependent diabetes (diagnosed at age 38)   Admitted 05/01/13 with N/V/diarrhea secondary to gastroparesis flare/gastroenteritis   Troponins x 3 negative   Tip of PICC in lower SVC   Echo: nl EF, no wall motion abnormalities   Coronary CTA was -      Recommendations:    Has had no evidence of arrhythmia and his ECHO was unremarkable without any evidence of WMA and normal EF. His coronary CTA was normal as well and did not show any evidence of obstructive disease. His prognosis is good based on the above studies.    See [name] in the clinic 2-3 weeks post discharge.    Thank you. I will start following with you peripherally. Please call if you have any questions or further concerns.         Medications:      Scheduled Meds:         heparin (porcine) 5,000 Units Subcutaneous Q8H SCH   insulin glargine 22 Units Subcutaneous QHS   metoclopramide 10 mg Oral TID AC   [COMPLETED] metoprolol 5 mg Intravenous Once   [COMPLETED] metoprolol 100 mg  Oral Once   [COMPLETED] metoprolol 25 mg Oral Once   [COMPLETED] nitroglycerin 0.4 mg Sublingual Once   pantoprazole 40 mg Intravenous Daily   potassium chloride 20 mEq Oral Once   promethazine 12.5 mg Oral Q6H [facility]   [DISCONTINUED] metoclopramide 10 mg Intravenous TID   [DISCONTINUED] metoclopramide 10 mg Oral TID AC   [DISCONTINUED] metoclopramide 10 mg Oral TID AC   [DISCONTINUED] promethazine 12.5 mg Intravenous Q6H       Continuous Infusions:       . sodium chloride 150 mL/hr (05/08/13 0150)            Physical Exam:       VITAL SIGNS PHYSICAL EXAM   Filed Vitals:    05/08/13 1249   BP: 149/74   Pulse: 74   Temp:    Resp:    SpO2:      Temp (24hrs), Avg:97.2 F (36.2 C), Min:96.1 F (35.6 C), Max:97.9 F (36.6 C)      Telemetry: Reviewed no changes      Intake/Output Summary (Last 24 hours) at 05/08/13 1825  Last data filed at 05/08/13 1100   Gross per 24 hour   Intake    450 ml   Output  0 ml   Net    450 ml    Physical Exam  General: awake, alert, breathing comfortable, no acute distress  Head: normocephalic  Eyes: EOM's intact  Cardiovascular: regular rate and rhythm, S1, S2, no S3, no S4, no murmurs, rubs or gallops  Neck: no carotid bruits or JVD  Lungs: clear to auscultation bilateraly, without wheezing, rhonchi, or rales  Abdomen: soft, non-tender, non-distended; no palpable masses,  normoactive bowel sounds  Extremities: no edema  Pulse: equal pulses, 4/4 symmetric  Neurological: Alert and oriented X3, mood and affect normal         Labs:     Lab 05/04/13 0212 05/03/13 1756 05/03/13 1246   CK -- -- --   TROPI 0.04 0.04 0.02   TROPT -- -- --   CKMBINDEX -- -- --       Lab 05/01/13 2040   BILITOTAL 0.4   BILIDIRECT --   PROT 7.4   ALB 4.2   ALT 21   AST 14       Lab 05/05/13 0446   MG 1.8       Lab 05/07/13 0534 05/05/13 0827 05/04/13 0211   WBC 7.17 8.84 9.57   HGB 12.3* 11.8* 12.0*   HCT 37.0* 35.8* 37.0*   PLT 233 229 273       Lab 05/08/13 1001 05/07/13 0535 05/06/13 0617   NA 140 140 142   K  3.5 3.7 3.5   CL 108* 105 106   CO2 27 26 29    BUN 10.0 9.0 9.0   CREAT 0.9 1.0 1.0   EGFR >60.0 >60.0 >60.0   GLU 147* 216* 151*   CA 8.2* 8.6 8.4*       .  No results found for this basename: BNP        Imaging:   Radiological Procedure reviewed.        Signed by: Lynford Humphrey, MD      [facility]  NP Spectralink [phone] (8am-5pm)  MD Spectralink [phone] or [phone] (8am-5pm)  Arrhythmia Spectralink [phone] (8am-4:30pm)  After hours, non urgent consult line [phone]  After Hours, urgent consults [phone]

## 2013-05-08 NOTE — Progress Notes (Signed)
RN transport: Pt transported via stretcher, on cardiac monitor and accompanied by RN. Cardiac monitor shows SR, rate 67-83; no ectopy evident during transport or study. Returned to room, primary nurse at bedside and given update, re: meds given in CT and pt tolerance.

## 2013-05-08 NOTE — Progress Notes (Signed)
[facility] Dispo: Home, PCP F/U  Anticipated [place] Date: 05/08/13    Sw confirmed f/u apt is on Tuesday the 30th @ 9:45. Sw spoke to pt and if discharging, will have a ride home.      Shirlean Mylar, MSW  [facility] Discharge Planner  [facility]  [phone]

## 2013-05-08 NOTE — Progress Notes (Signed)
PROGRESS NOTE    Date Time: 05/08/2013 7:54 AM  Patient Name: Huq,Lonzy L      Assessment:     Patient Active Problem List     Diagnosis  Date Noted    .  Gastroparesis  02/16/2013    .  Abdominal pain  02/09/2013    .  Diabetic gastroparesis  08/22/2012    .  Benign essential hypertension  08/05/2012    .  Type 2 diabetes, uncontrolled, with neuropathy  06/27/2012    .  Nausea & vomiting  06/27/2012    .  GERD (gastroesophageal reflux disease)  06/17/2012    .  Gastritis without bleeding  06/17/2012      38 y.o. male male with a PMH significant for Gastroparesis (multiple prior admissions), uncontrolled IDDM, and HTN who presents to the hospital with intractable nausea and vomiting,watery diarrhea, likely secondary to gastroparesis flare. Abdominal pain and nausea improved after 24 hrs NPO.    Plan:   1). Nausea and Vomiting, likely 2/2 Gastroparesis flare -improving after kept NPO  -Transition to PO medications: Phenergan 12.5 mg q6h scheduled , Reglan 10 mg TID scheduled   -IV Protonix daily  - restart Carafate 1 gm PO QID when tolerating PO   - ativan 1 mg IV prn and dilaudid 1 mg q4hrs prn pain, required 4 mg Dilaudid and 3 mg Ativan over past 24 hrs  -Continue  IVFs @ 150 cc/hr  -restart liquid diet for lunch today  -monitor electrolytes, replete as needed  -RUQ US shows s/p cholecystectomy, no gallstones. Small hyperechogenicity w/in right liver lobe, to be followed up as outpatient    2). Vtach-could be related to prolonged QT in setting of reglan and erythromycin. No further episodes of sustained Vtach on telemetry since 05/02/13  -Erythromycin discontinued  -EKG from yesterday with QTc <500 ms   -echo with normal EF 55-65%, mild concentric LVH, dilated RV but normal RV function  -troponins negative X 3  -Continuous O2 night time sat without desaturation, less likely OSA  -appreciate cardiology recs, ischemic evaluation as outpatient    3). AKI, likely pre-renal in setting of dehydration  -resolved  -Creatinine 1.0  - IVFs as noted above   - will monitor BMP    4). Diarrhea--resolved  - suspect this may be 2/2 gastroenteritis   - IVFs as noted above     5). IDDM, uncontrolled: BS 130-180's  - continue 22 units lantus qhs     6). HTN   - Labetalol PRN   - restarted metoprolol, can likely readd other if tolerating PO today (Amlodipine, Lisinopril)    Code: FULL   Diet: restart clear liquids, continue IVFs  PPX: SCDs, SQH     Discussed with Dr. Rogelia Boga    Dispo: Advancing diet, may discharge home this afternoon if tolerates PO      Subjective:   No acute events overnight. Kept NPO yesterday and abdominal pain and nausea improved. Patient endorses feeling sad at times. He has recently experiences increased relationship and financial stress. He does not have much support in this area. Planning to move to [place] to be closer to his mother. He also has three children who live in [place].     Medications:     Current Facility-Administered Medications   Medication Dose Route Frequency   . heparin (porcine)  5,000 Units Subcutaneous Q8H SCH   . insulin glargine  22 Units Subcutaneous QHS   . metoclopramide  10 mg Intravenous  TID   . pantoprazole  40 mg Intravenous Daily   . potassium chloride  20 mEq Oral Once   . promethazine  12.5 mg Intravenous Q6H       Physical Exam:     Filed Vitals:    05/08/13 0340   BP: 155/89   Pulse: 87   Temp: 97 F (36.1 C)   Resp: 16   SpO2: 95%       Intake and Output Summary (Last 24 hours) at Date Time  No intake or output data in the 24 hours ending 05/08/13 0754  General: awake, alert, oriented, resting, appears comfortable, flat affect  HEENT: moist, mucous membranes  Cardiovascular: regular rate and rhythm, no murmurs, rubs or gallops   Lungs: clear to auscultation bilaterally, without wheezing, rhonchi, or rales   Abdomen: soft, nontender, nondistended  Extremities: no clubbing, cyanosis, edema, 2+ dp pulses  Skin: no rashes or lesions noted        Labs:      Results     Procedure Component Value Units Date/Time    Glucose Whole Blood - POCT [[id number]]  (Abnormal) Collected:05/07/13 2021     POCT - Glucose Whole blood 137 (H) mg/dL LOVFIEP:32/95/18 8416    Glucose Whole Blood - POCT [[id number]]  (Abnormal) Collected:05/07/13 1651     POCT - Glucose Whole blood 151 (H) mg/dL UXNATFT:73/22/02 5427    Glucose Whole Blood - POCT [[id number]]  (Abnormal) Collected:05/07/13 1114     POCT - Glucose Whole blood 185 (H) mg/dL TDVVOHY:07/37/10 6269        Rads:   Radiological Procedure reviewed.    Signed by: Aquilla Hacker

## 2013-05-08 NOTE — Progress Notes (Signed)
[facility] Attending Note    This patient was evaluated on rounds with Dr. Hunt Oris.  Physical exam directly observed and duplicated.  I agree with assessment and plan as outlined.      Feeling better today. Agree with trial of clears for lunch, switch to PO meds, and possible d/c today or tomorrow if continues to do well.    Patient Active Problem List   Diagnosis   . GERD (gastroesophageal reflux disease)   . Gastritis without bleeding   . Type 2 diabetes, uncontrolled, with neuropathy   . Nausea & vomiting   . Benign essential hypertension   . Diabetic gastroparesis   . Abdominal pain   . Gastroparesis   . Nausea vomiting and diarrhea

## 2013-05-08 NOTE — Progress Notes (Signed)
Tolerated CT well.  Denies any complaints.  Vital signs stable at completion of testing.  Report to transport RN.

## 2013-05-08 NOTE — Progress Notes (Deleted)
 Dispo: Home, no needs  Anticipated  Date: 05/08/13    Sw spoke to pt and her husband will come pick her up at time of discharge. Pt explained they have just moved into a new house and she does not have keys. Her husband currently has the only key and will come pick her up once he is done with work for the day (likely 6ish)      Shirlean Mylar, MSW  [facility] Discharge Planner  [facility]  [phone]

## 2013-05-08 NOTE — Progress Notes (Addendum)
Patient is resting in the room. He is alert, awake and oriented yet somewhat withdrawn. He states that it is difficult for him with dealing with his chronic illness and he is trying to deal with that. Denies pain. Denies n/v/d. He has CTA angio ordered for today and will go monitored with transport RN shortly. He has been SR for this RN's shift. It appears he was SR/ST to low 100's overnight. He is asking for a diet and he is now on clear liquids. He is tolerating water thus far. VSS. Monitoring patient and will address needs.     1258: patient returned from CTA, placed back on tele and hooked up to IVF. Monitoring.

## 2013-05-08 NOTE — Progress Notes (Signed)
Patient tolerated clear liquids this afternoon and has been advanced to low fat diet. He has finished eating dinner and no complaints at this time. Will check on patient again and see how he feels.

## 2013-05-09 LAB — CBC AND DIFFERENTIAL
Basophils Absolute Automated: 0.06 10*3/uL (ref 0.00–0.20)
Basophils Automated: 1 %
Eosinophils Absolute Automated: 0.53 10*3/uL (ref 0.00–0.70)
Eosinophils Automated: 8 %
Hematocrit: 33.8 % — ABNORMAL LOW (ref 42.0–52.0)
Hgb: 11.5 g/dL — ABNORMAL LOW (ref 13.0–17.0)
Immature Granulocytes Absolute: 0.04 10*3/uL
Immature Granulocytes: 1 %
Lymphocytes Absolute Automated: 2.11 10*3/uL (ref 0.50–4.40)
Lymphocytes Automated: 30 %
MCH: 27 pg — ABNORMAL LOW (ref 28.0–32.0)
MCHC: 34 g/dL (ref 32.0–36.0)
MCV: 79.3 fL — ABNORMAL LOW (ref 80.0–100.0)
MPV: 9.2 fL — ABNORMAL LOW (ref 9.4–12.3)
Monocytes Absolute Automated: 0.64 10*3/uL (ref 0.00–1.20)
Monocytes: 9 %
Neutrophils Absolute: 3.73 10*3/uL (ref 1.80–8.10)
Neutrophils: 52 %
Nucleated RBC: 0 /100 WBC (ref 0–1)
Platelets: 191 10*3/uL (ref 140–400)
RBC: 4.26 10*6/uL — ABNORMAL LOW (ref 4.70–6.00)
RDW: 13 % (ref 12–15)
WBC: 7.11 10*3/uL (ref 3.50–10.80)

## 2013-05-09 LAB — GLUCOSE WHOLE BLOOD - POCT
Whole Blood Glucose POCT: 150 mg/dL — ABNORMAL HIGH (ref 70–100)
Whole Blood Glucose POCT: 228 mg/dL — ABNORMAL HIGH (ref 70–100)

## 2013-05-09 LAB — BASIC METABOLIC PANEL
BUN: 8 mg/dL — ABNORMAL LOW (ref 9.0–21.0)
CO2: 28 mEq/L (ref 22–29)
Calcium: 8.5 mg/dL (ref 8.5–10.5)
Chloride: 106 mEq/L (ref 98–107)
Creatinine: 1 mg/dL (ref 0.7–1.3)
Glucose: 188 mg/dL — ABNORMAL HIGH (ref 70–100)
Potassium: 3.1 mEq/L — ABNORMAL LOW (ref 3.5–5.1)
Sodium: 140 mEq/L (ref 136–145)

## 2013-05-09 LAB — GFR: EGFR: >60

## 2013-05-09 MED ORDER — LISINOPRIL 40 MG PO TABS
40.0000 mg | ORAL_TABLET | Freq: Every day | ORAL | Status: AC
Start: 2013-05-09 — End: ?

## 2013-05-09 MED ORDER — METOPROLOL SUCCINATE ER 50 MG PO TB24
50.0000 mg | ORAL_TABLET | Freq: Every day | ORAL | Status: AC
Start: 2013-05-09 — End: ?

## 2013-05-09 MED ORDER — POTASSIUM CHLORIDE CRYS ER 20 MEQ PO TBCR
40.0000 meq | EXTENDED_RELEASE_TABLET | Freq: Once | ORAL | Status: AC
Start: 2013-05-09 — End: 2013-05-09
  Administered 2013-05-09: 40 meq via ORAL
  Filled 2013-05-09: qty 2

## 2013-05-09 NOTE — Progress Notes (Signed)
[facility] Attending Note    This patient was evaluated on rounds with Dr. Hunt Oris.  Physical exam directly observed and duplicated.  I agree with assessment and plan as outlined.      Feeling "much better" today. Tolerated dinner and breakfast well. Agree with d/c home today.    Patient Active Problem List   Diagnosis   . GERD (gastroesophageal reflux disease)   . Gastritis without bleeding   . Type 2 diabetes, uncontrolled, with neuropathy   . Nausea & vomiting   . Benign essential hypertension   . Diabetic gastroparesis   . Abdominal pain   . Gastroparesis

## 2013-05-09 NOTE — Plan of Care (Signed)
Pt is Sr on tele. VSS on RA. Pt tolerates his diet well. PICC is out intact. Prescription and scripts are reviewed and handed to Pt. Pt stated he will leave after lunch. Will continue to monitor.

## 2013-05-30 ENCOUNTER — Emergency Department (HOSPITAL_COMMUNITY): Payer: BC Managed Care – PPO

## 2013-05-30 ENCOUNTER — Encounter (HOSPITAL_COMMUNITY): Payer: Self-pay | Admitting: Emergency Medicine

## 2013-05-30 ENCOUNTER — Inpatient Hospital Stay (HOSPITAL_COMMUNITY)
Admission: EM | Admit: 2013-05-30 | Discharge: 2013-06-02 | DRG: 074 | Disposition: A | Discharge status: Home / Self Care | Payer: BC Managed Care – PPO | Attending: Internal Medicine | Admitting: Internal Medicine

## 2013-05-30 DIAGNOSIS — E119 Type 2 diabetes mellitus without complications: Secondary | ICD-10-CM

## 2013-05-30 DIAGNOSIS — E109 Type 1 diabetes mellitus without complications: Secondary | ICD-10-CM

## 2013-05-30 DIAGNOSIS — E860 Dehydration: Secondary | ICD-10-CM

## 2013-05-30 DIAGNOSIS — I10 Essential (primary) hypertension: Secondary | ICD-10-CM

## 2013-05-30 DIAGNOSIS — Z79899 Other long term (current) drug therapy: Secondary | ICD-10-CM

## 2013-05-30 DIAGNOSIS — Z6835 Body mass index (BMI) 35.0-35.9, adult: Secondary | ICD-10-CM

## 2013-05-30 DIAGNOSIS — K3184 Gastroparesis: Secondary | ICD-10-CM

## 2013-05-30 DIAGNOSIS — A088 Other specified intestinal infections: Secondary | ICD-10-CM | POA: Diagnosis present

## 2013-05-30 DIAGNOSIS — R112 Nausea with vomiting, unspecified: Secondary | ICD-10-CM

## 2013-05-30 DIAGNOSIS — Z794 Long term (current) use of insulin: Secondary | ICD-10-CM

## 2013-05-30 DIAGNOSIS — R1115 Cyclical vomiting syndrome unrelated to migraine: Secondary | ICD-10-CM

## 2013-05-30 DIAGNOSIS — N179 Acute kidney failure, unspecified: Secondary | ICD-10-CM

## 2013-05-30 DIAGNOSIS — IMO0001 Reserved for inherently not codable concepts without codable children: Secondary | ICD-10-CM

## 2013-05-30 DIAGNOSIS — E1049 Type 1 diabetes mellitus with other diabetic neurological complication: Principal | ICD-10-CM | POA: Diagnosis present

## 2013-05-30 DIAGNOSIS — R000 Tachycardia, unspecified: Secondary | ICD-10-CM

## 2013-05-30 LAB — CBC WITH DIFFERENTIAL/PLATELET
Basophils Absolute: 0.1 10*3/uL (ref 0.0–0.1)
Basophils Relative: 1 % (ref 0–1)
EOS PCT: 3 % (ref 0–5)
Eosinophils Absolute: 0.4 10*3/uL (ref 0.0–0.7)
HEMATOCRIT: 40.4 % (ref 39.0–52.0)
Hemoglobin: 13.9 g/dL (ref 13.0–17.0)
LYMPHS ABS: 0.9 10*3/uL (ref 0.7–4.0)
Lymphocytes Relative: 7 % — ABNORMAL LOW (ref 12–46)
MCH: 27.7 pg (ref 26.0–34.0)
MCHC: 34.4 g/dL (ref 30.0–36.0)
MCV: 80.5 fL (ref 78.0–100.0)
MONO ABS: 0.6 10*3/uL (ref 0.1–1.0)
Monocytes Relative: 4 % (ref 3–12)
Neutro Abs: 11.3 10*3/uL — ABNORMAL HIGH (ref 1.7–7.7)
Neutrophils Relative %: 85 % — ABNORMAL HIGH (ref 43–77)
PLATELETS: 282 10*3/uL (ref 150–400)
RBC: 5.02 MIL/uL (ref 4.22–5.81)
RDW: 13.1 % (ref 11.5–15.5)
WBC: 13.2 10*3/uL — AB (ref 4.0–10.5)

## 2013-05-30 LAB — BLOOD GAS, VENOUS
ACID-BASE DEFICIT: 2.6 mmol/L — AB (ref 0.0–2.0)
BICARBONATE: 22.6 meq/L (ref 20.0–24.0)
O2 SAT: 80.6 %
PATIENT TEMPERATURE: 98.6
TCO2: 20.2 mmol/L (ref 0–100)
pCO2, Ven: 43 mmHg — ABNORMAL LOW (ref 45.0–50.0)
pH, Ven: 7.34 — ABNORMAL HIGH (ref 7.250–7.300)
pO2, Ven: 48.5 mmHg — ABNORMAL HIGH (ref 30.0–45.0)

## 2013-05-30 LAB — URINALYSIS, ROUTINE W REFLEX MICROSCOPIC
BILIRUBIN URINE: NEGATIVE
Glucose, UA: >1000 mg/dL — AB
Hgb urine dipstick: NEGATIVE
KETONES UR: 15 mg/dL — AB
Leukocytes, UA: NEGATIVE
Nitrite: NEGATIVE
Protein, ur: 30 mg/dL — AB
Specific Gravity, Urine: 1.027 (ref 1.005–1.030)
UROBILINOGEN UA: 0.2 mg/dL (ref 0.0–1.0)
pH: 5 (ref 5.0–8.0)

## 2013-05-30 LAB — COMPREHENSIVE METABOLIC PANEL
ALK PHOS: 98 U/L (ref 39–117)
ALT: 20 U/L (ref 0–53)
AST: 15 U/L (ref 0–37)
Albumin: 4.1 g/dL (ref 3.5–5.2)
BUN: 16 mg/dL (ref 6–23)
CALCIUM: 9.5 mg/dL (ref 8.4–10.5)
CO2: 22 mEq/L (ref 19–32)
Chloride: 101 mEq/L (ref 96–112)
Creatinine, Ser: 1.52 mg/dL — ABNORMAL HIGH (ref 0.50–1.35)
GFR calc non Af Amer: 57 mL/min — ABNORMAL LOW (ref >90)
GFR, EST AFRICAN AMERICAN: 67 mL/min — AB (ref >90)
Glucose, Bld: 294 mg/dL — ABNORMAL HIGH (ref 70–99)
Potassium: 4.9 mEq/L (ref 3.7–5.3)
SODIUM: 140 meq/L (ref 137–147)
Total Bilirubin: 0.4 mg/dL (ref 0.3–1.2)
Total Protein: 7.3 g/dL (ref 6.0–8.3)

## 2013-05-30 LAB — CBG MONITORING, ED: Glucose-Capillary: 271 mg/dL — ABNORMAL HIGH (ref 70–99)

## 2013-05-30 LAB — URINE MICROSCOPIC-ADD ON

## 2013-05-30 LAB — I-STAT CG4 LACTIC ACID, ED: Lactic Acid, Venous: 3.74 mmol/L — ABNORMAL HIGH (ref 0.5–2.2)

## 2013-05-30 LAB — LIPASE, BLOOD: Lipase: 15 U/L (ref 11–59)

## 2013-05-30 MED ORDER — ENOXAPARIN SODIUM 40 MG/0.4ML ~~LOC~~ SOLN
40.0000 mg | Freq: Every day | SUBCUTANEOUS | Status: DC
  Administered 2013-05-30: 40 mg via SUBCUTANEOUS
  Filled 2013-05-30 (×2): qty 0.4

## 2013-05-30 MED ORDER — SODIUM CHLORIDE 0.9 % IV SOLN
INTRAVENOUS | Status: DC
  Administered 2013-05-30 – 2013-06-01 (×5): via INTRAVENOUS

## 2013-05-30 MED ORDER — ONDANSETRON HCL 4 MG PO TABS
4.0000 mg | ORAL_TABLET | Freq: Four times a day (QID) | ORAL | Status: DC | PRN
  Filled 2013-05-30: qty 1

## 2013-05-30 MED ORDER — ONDANSETRON HCL 4 MG/2ML IJ SOLN
4.0000 mg | Freq: Once | INTRAMUSCULAR | Status: AC
  Administered 2013-05-30: 4 mg via INTRAVENOUS
  Filled 2013-05-30: qty 2

## 2013-05-30 MED ORDER — SODIUM CHLORIDE 0.9 % IV BOLUS (SEPSIS)
1000.0000 mL | Freq: Once | INTRAVENOUS | Status: AC
  Administered 2013-05-30: 1000 mL via INTRAVENOUS

## 2013-05-30 MED ORDER — LISINOPRIL 20 MG PO TABS
20.0000 mg | ORAL_TABLET | Freq: Every day | ORAL | Status: DC
  Filled 2013-05-30: qty 1

## 2013-05-30 MED ORDER — ACETAMINOPHEN 650 MG RE SUPP: 650.0000 mg | Freq: Four times a day (QID) | RECTAL | Status: DC | PRN

## 2013-05-30 MED ORDER — INSULIN ASPART 100 UNIT/ML ~~LOC~~ SOLN
0.0000 [IU] | Freq: Three times a day (TID) | SUBCUTANEOUS | Status: DC
Start: 2013-05-31 — End: 2013-05-31
  Administered 2013-05-31: 5 [IU] via SUBCUTANEOUS

## 2013-05-30 MED ORDER — METOCLOPRAMIDE HCL 5 MG/ML IJ SOLN
10.0000 mg | Freq: Once | INTRAMUSCULAR | Status: AC
  Administered 2013-05-30: 10 mg via INTRAVENOUS
  Filled 2013-05-30: qty 2

## 2013-05-30 MED ORDER — AMLODIPINE BESYLATE 5 MG PO TABS
5.0000 mg | ORAL_TABLET | Freq: Every day | ORAL | Status: DC
  Filled 2013-05-30: qty 1

## 2013-05-30 MED ORDER — PANTOPRAZOLE SODIUM 40 MG IV SOLR
40.0000 mg | Freq: Once | INTRAVENOUS | Status: AC
  Administered 2013-05-30: 40 mg via INTRAVENOUS
  Filled 2013-05-30: qty 40

## 2013-05-30 MED ORDER — CLONIDINE HCL 0.1 MG PO TABS
0.1000 mg | ORAL_TABLET | Freq: Two times a day (BID) | ORAL | Status: DC
  Administered 2013-05-31 – 2013-06-02 (×5): 0.1 mg via ORAL
  Filled 2013-05-30 (×7): qty 1

## 2013-05-30 MED ORDER — METOPROLOL SUCCINATE ER 50 MG PO TB24
50.0000 mg | ORAL_TABLET | Freq: Every day | ORAL | Status: DC
  Administered 2013-05-31: 50 mg via ORAL
  Filled 2013-05-30 (×2): qty 1

## 2013-05-30 MED ORDER — GABAPENTIN 300 MG PO CAPS
600.0000 mg | ORAL_CAPSULE | Freq: Every day | ORAL | Status: DC
  Administered 2013-06-01: 600 mg via ORAL
  Filled 2013-05-30 (×4): qty 2

## 2013-05-30 MED ORDER — PROMETHAZINE HCL 25 MG/ML IJ SOLN
25.0000 mg | Freq: Once | INTRAMUSCULAR | Status: AC
  Administered 2013-05-30: 25 mg via INTRAVENOUS
  Filled 2013-05-30: qty 1

## 2013-05-30 MED ORDER — HYDROCODONE-ACETAMINOPHEN 5-325 MG PO TABS
1.0000 | ORAL_TABLET | ORAL | Status: DC | PRN
  Administered 2013-06-02: 2 via ORAL
  Filled 2013-05-30: qty 2

## 2013-05-30 MED ORDER — ACETAMINOPHEN 325 MG PO TABS: 650.0000 mg | ORAL_TABLET | Freq: Four times a day (QID) | ORAL | Status: DC | PRN

## 2013-05-30 MED ORDER — METOCLOPRAMIDE HCL 5 MG/ML IJ SOLN
10.0000 mg | Freq: Three times a day (TID) | INTRAMUSCULAR | Status: DC
  Administered 2013-05-30 – 2013-05-31 (×3): 10 mg via INTRAVENOUS
  Filled 2013-05-30 (×5): qty 2

## 2013-05-30 MED ORDER — MORPHINE SULFATE 2 MG/ML IJ SOLN
2.0000 mg | INTRAMUSCULAR | Status: DC | PRN
  Administered 2013-05-30 – 2013-05-31 (×4): 2 mg via INTRAVENOUS
  Filled 2013-05-30 (×4): qty 1

## 2013-05-30 MED ORDER — ONDANSETRON HCL 4 MG/2ML IJ SOLN: 4.0000 mg | Freq: Three times a day (TID) | INTRAMUSCULAR | Status: DC | PRN

## 2013-05-30 MED ORDER — HYDROMORPHONE HCL PF 1 MG/ML IJ SOLN
1.0000 mg | Freq: Once | INTRAMUSCULAR | Status: AC
  Administered 2013-05-30: 1 mg via INTRAVENOUS
  Filled 2013-05-30: qty 1

## 2013-05-30 MED ORDER — ERYTHROMYCIN BASE 250 MG PO TBEC
250.0000 mg | DELAYED_RELEASE_TABLET | Freq: Three times a day (TID) | ORAL | Status: DC
  Administered 2013-05-31: 250 mg via ORAL
  Filled 2013-05-30 (×6): qty 1

## 2013-05-30 MED ORDER — SODIUM CHLORIDE 0.9 % IV SOLN
INTRAVENOUS | Status: DC
  Administered 2013-05-30: 23:00:00 via INTRAVENOUS

## 2013-05-30 MED ORDER — PANTOPRAZOLE SODIUM 40 MG IV SOLR
40.0000 mg | Freq: Every day | INTRAVENOUS | Status: DC
  Administered 2013-05-30: 40 mg via INTRAVENOUS
  Filled 2013-05-30 (×2): qty 40

## 2013-05-30 MED ORDER — INSULIN ASPART 100 UNIT/ML ~~LOC~~ SOLN
0.0000 [IU] | Freq: Every day | SUBCUTANEOUS | Status: DC
  Administered 2013-05-30: 3 [IU] via SUBCUTANEOUS

## 2013-05-30 MED ORDER — ONDANSETRON HCL 4 MG/2ML IJ SOLN: 4.0000 mg | Freq: Four times a day (QID) | INTRAMUSCULAR | Status: DC | PRN

## 2013-05-30 NOTE — ED Notes (Signed)
Unsuccessfull attempts at IV, Another RN to try

## 2013-05-30 NOTE — ED Notes (Signed)
Pt aware of the need for a urine sample, urinal at bedside. 

## 2013-05-30 NOTE — ED Notes (Signed)
Charge RN to attempt veni-puncture lab draw per MAIN LAB request.

## 2013-05-30 NOTE — ED Notes (Signed)
Dr Maryan Rued made aware of abnormal lactic acid.

## 2013-05-30 NOTE — ED Notes (Addendum)
Pt still unable to void at this time, has been instructed to let staff know when the need arises

## 2013-05-30 NOTE — H&P (Signed)
[facility]                                                                                    Patient Demographics  Duane [name], is a 38 y.o. male  CSN: [id number]  MRN: [id number]  DOB - 1975/06/30  Admit Date - 05/30/2013  Outpatient Primary MD for the patient is No PCP Per Patient   With History of -  Past Medical History  Diagnosis Date  . Hypertension   . Diabetes mellitus without complication   . Gastritis   . Gastroparesis       Past Surgical History  Procedure Laterality Date  . Cholecystectomy    . Knee surgery Left     in for   Chief Complaint  Patient presents with  . Nausea  . Emesis  . Diarrhea     HPI  Duane [name]  is a 38 y.o. male, with a past medical history significant for diabetes mellitus type 1, and gastroparesis who recently moved to the area from [place] presenting with multiple episodes of nausea and vomiting after diarrhea for the last few days. The patient is on Reglan, Protonix, and Carafate by mouth for a diagnosis of gastroparesis. He denies any fever or chills but reports feeling dizzy and weak with mild headache. He was admitted to the hospital on one month ago in [place] for the same problem . No episodes of chest pains shortness of breath cough    Review of Systems    In addition to the HPI above,  No Fever-chills, No changes with Vision or hearing, No problems swallowing food or Liquids, No Chest pain, Cough or Shortness of Breath, No Blood in stool or Urine, No dysuria, No new skin rashes or bruises, No new joints pains-aches,  No new weakness, tingling, numbness in any extremity, No recent weight gain or loss, No polyuria, polydypsia or polyphagia, No significant Mental Stressors.  A full 10 point Review of Systems was done, except as stated above, all other Review of Systems were negative.   Social History History  Substance Use Topics  . Smoking status: Never Smoker   . Smokeless  tobacco: Never Used  . Alcohol Use: Yes     Family History Significant for diabetes mellitus  Prior to Admission medications   Medication Sig Start Date End Date Taking? Authorizing Provider  acetaminophen (TYLENOL) 500 MG tablet Take 500 mg by mouth every 6 (six) hours as needed for mild pain.    Yes Historical Provider, MD  amLODipine (NORVASC) 5 MG tablet Take 5 mg by mouth daily.   Yes Historical Provider, MD  cloNIDine (CATAPRES) 0.1 MG tablet Take 0.1 mg by mouth 2 (two) times daily.   Yes Historical Provider, MD  gabapentin (NEURONTIN) 300 MG capsule Take 600 mg by mouth at bedtime.   Yes Historical Provider, MD  insulin aspart (NOVOLOG) 100 UNIT/ML injection Inject 9-12 Units into the skin 3 (three) times daily before meals.   Yes Historical Provider, MD  insulin glargine (LANTUS) 100 UNIT/ML injection Inject 36 Units into the skin at bedtime.    Yes Historical Provider, MD  lisinopril (PRINIVIL,ZESTRIL) 20 MG tablet  Take 20 mg by mouth daily.   Yes Historical Provider, MD  metoCLOPramide (REGLAN) 10 MG tablet Take 10 mg by mouth 4 (four) times daily.   Yes Historical Provider, MD  metoprolol succinate (TOPROL-XL) 50 MG 24 hr tablet Take 50 mg by mouth daily. Take with or immediately following a meal.   Yes Historical Provider, MD  pantoprazole (PROTONIX) 40 MG tablet Take 40 mg by mouth daily.   Yes Historical Provider, MD  sucralfate (CARAFATE) 1 G tablet Take 1 g by mouth 4 (four) times daily -  before meals and at bedtime.   Yes Historical Provider, MD    No Known Allergies  Physical Exam  Vitals  Blood pressure 160/99, pulse 115, temperature 98.4 F (36.9 C), temperature source Oral, resp. rate 18, SpO2 99.00%.   1. General young African American male lying in bed, looks very tired and somnolent  2. Normal insight, Not Suicidal or Homicidal, Awake Alert, Oriented X 3.  3. No F.N deficits, ALL C.Nerves Intact, Strength 5/5 all 4 extremities, Sensation intact all 4  extremities, Plantars down going.  4. Ears and Eyes appear Normal, Conjunctivae clear, PERRLA. Moist Oral Mucosa.  5. Supple Neck, No JVD, No cervical lymphadenopathy appriciated, No Carotid Bruits.  6. Symmetrical Chest wall movement, Good air movement bilaterally, CTAB.  7. RRR, No Gallops, Rubs or Murmurs, No Parasternal Heave.  8. Positive Bowel Sounds, Abdomen Soft, Non tender, No organomegaly appriciated,No rebound -guarding or rigidity.  9.  No Cyanosis, Normal Skin Turgor, No Skin Rash or Bruise.  10. Good muscle tone,  joints appear normal , no effusions, Normal ROM.  11. No Palpable Lymph Nodes in Neck or Axillae    Data Review  CBC  Recent Labs Lab 05/30/13 1823  WBC 13.2*  HGB 13.9  HCT 40.4  PLT 282  MCV 80.5  MCH 27.7  MCHC 34.4  RDW 13.1  LYMPHSABS 0.9  MONOABS 0.6  EOSABS 0.4  BASOSABS 0.1   ------------------------------------------------------------------------------------------------------------------  Chemistries   Recent Labs Lab 05/30/13 1823  NA 140  K 4.9  CL 101  CO2 22  GLUCOSE 294*  BUN 16  CREATININE 1.52*  CALCIUM 9.5  AST 15  ALT 20  ALKPHOS 98  BILITOT 0.4    Coagulation profile No results found for this basename: INR, PROTIME,  in the last 168 hours ------------------------------------------------------------------------------------------------------------------- No results found for this basename: DDIMER,  in the last 72 hours -------------------------------------------------------------------------------------------------------------------  Cardiac Enzymes No results found for this basename: CK, CKMB, TROPONINI, MYOGLOBIN,  in the last 168 hours ------------------------------------------------------------------------------------------------------------------ No components found with this basename: POCBNP,     ---------------------------------------------------------------------------------------------------------------  Urinalysis No results found for this basename: colorurine, appearanceur, labspec, phurine, glucoseu, hgbur, bilirubinur, ketonesur, proteinur, urobilinogen, nitrite, leukocytesur    ----------------------------------------------------------------------------------------------------------------     Imaging results:   Dg Abd Acute W/chest  05/30/2013   CLINICAL DATA:  Upper abdominal pain for the past few weeks recently increasing. Nausea and vomiting. Diaphoresis.  EXAM: ACUTE ABDOMEN SERIES (ABDOMEN 2 VIEW & CHEST 1 VIEW)  COMPARISON:  No priors.  FINDINGS: Lung volumes are normal. No consolidative airspace disease. No pleural effusions. No pneumothorax. No pulmonary nodule or mass noted. Pulmonary vasculature and the cardiomediastinal silhouette are within normal limits.  Gas and stool are seen scattered throughout the colon extending to the level of the distal rectum. No pathologic distension of small bowel is noted. No gross evidence of pneumoperitoneum. Surgical clips project over the right upper quadrant of the abdomen, likely  from prior cholecystectomy.  IMPRESSION: 1.  Nonobstructive bowel gas pattern. 2. No pneumoperitoneum. 3. No radiographic evidence of acute cardiopulmonary disease.   Electronically Signed   By: Vinnie Langton M.D.   On: 05/30/2013 15:53    My personal review of EKG: Sinus bradycardia with possible left artery enlargement    Assessment & Plan  1. intractable nausea and vomiting and history of diarrhea 2. History of insulin dependent diabetes mellitus with gastroparesis  Plan IV Zofran and Reglan Restart erythromycin Hold Carafate for now IV fluids   DVT Prophylaxis Lovenox  AM Labs Ordered, also please review Full Orders   Code Status full  Disposition Plan: Home  Time spent in minutes : 32 minutes  Condition  fair   @SIGNATURE @

## 2013-05-30 NOTE — ED Notes (Signed)
Pt reports that Zofran doesn't work, Phenergan works best.

## 2013-05-30 NOTE — ED Provider Notes (Signed)
CSN: [id number]     Arrival date & time 05/30/13  1501 History   First MD Initiated Contact with Patient 05/30/13 1513     Chief Complaint  Patient presents with  . Nausea  . Emesis  . Diarrhea     (Consider location/radiation/quality/duration/timing/severity/associated sxs/prior Treatment) Patient is a 38 y.o. male presenting with vomiting. The history is provided by the patient.  Emesis Severity:  Severe Duration:  2 weeks Timing:  Intermittent Number of daily episodes:  Vomiting worsened today around 8am when he has been vomiting about 10-20 time an hour Quality:  Stomach contents Progression:  Unchanged Chronicity:  Recurrent Recent urination:  Decreased Relieved by:  Nothing Worsened by:  Liquids Ineffective treatments:  Antiemetics Associated symptoms: abdominal pain and diarrhea   Associated symptoms: no cough, no fever and no myalgias   Associated symptoms comment:  Sweats Abdominal pain:    Location:  Epigastric   Quality:  Aching and burning   Severity:  Moderate   Onset quality:  Gradual   Timing:  Constant   Progression:  Unchanged Diarrhea:    Quality:  Watery   Number of occurrences:  For the last 2 weeks intermittently   Severity:  Moderate   Timing:  Intermittent   Progression:  Unchanged Risk factors: alcohol use, diabetes and prior abdominal surgery   Risk factors: no sick contacts     Past Medical History  Diagnosis Date  . Hypertension   . Diabetes mellitus without complication   . Gastritis   . Gastroparesis    Past Surgical History  Procedure Laterality Date  . Cholecystectomy    . Knee surgery Left    No family history on file. History  Substance Use Topics  . Smoking status: Never Smoker   . Smokeless tobacco: Never Used  . Alcohol Use: Yes    Review of Systems  Constitutional: Negative for fever.  Respiratory: Positive for shortness of breath. Negative for cough.        States over the last 2 weeks when standing and walking  he will occasionally become dizzy and have mild SOB  Cardiovascular: Negative for chest pain.  Gastrointestinal: Positive for vomiting, abdominal pain and diarrhea.  Genitourinary: Negative for dysuria and hematuria.  Musculoskeletal: Negative for myalgias.  Neurological: Positive for light-headedness.  All other systems reviewed and are negative.     Allergies  Review of patient's allergies indicates no known allergies.  Home Medications   Prior to Admission medications   Medication Sig Start Date End Date Taking? Authorizing Provider  acetaminophen (TYLENOL) 500 MG tablet Take 500 mg by mouth every 6 (six) hours as needed.    Historical Provider, MD  amLODipine (NORVASC) 5 MG tablet Take 5 mg by mouth daily.    Historical Provider, MD  cloNIDine (CATAPRES) 0.1 MG tablet Take 0.1 mg by mouth 2 (two) times daily.    Historical Provider, MD  erythromycin (ERY-TAB) 250 MG EC tablet Take 250 mg by mouth 3 (three) times daily.    Historical Provider, MD  gabapentin (NEURONTIN) 300 MG capsule Take 600 mg by mouth at bedtime.    Historical Provider, MD  ibuprofen (ADVIL,MOTRIN) 200 MG tablet Take 200 mg by mouth every 6 (six) hours as needed.    Historical Provider, MD  insulin aspart (NOVOLOG) 100 UNIT/ML injection Inject 9-12 Units into the skin 3 (three) times daily before meals.    Historical Provider, MD  insulin glargine (LANTUS) 100 UNIT/ML injection Inject 86 Units into the skin  at bedtime.    Historical Provider, MD  lisinopril (PRINIVIL,ZESTRIL) 20 MG tablet Take 20 mg by mouth daily.    Historical Provider, MD  metoCLOPramide (REGLAN) 10 MG tablet Take 10 mg by mouth 4 (four) times daily.    Historical Provider, MD  ondansetron (ZOFRAN ODT) 4 MG disintegrating tablet 4mg  ODT q4 hours prn nausea/vomit 01/09/13   Blanchard Kelch, MD  ondansetron (ZOFRAN-ODT) 4 MG disintegrating tablet Take 4 mg by mouth every 8 (eight) hours as needed for nausea or vomiting.    Historical Provider,  MD  oxyCODONE-acetaminophen (PERCOCET) 5-325 MG per tablet Take 1 tablet by mouth every 4 (four) hours as needed. 01/09/13   Blanchard Kelch, MD  pantoprazole (PROTONIX) 40 MG tablet Take 40 mg by mouth daily.    Historical Provider, MD  simethicone (MYLICON) 0000000 MG chewable tablet Chew 125 mg by mouth every 6 (six) hours as needed for flatulence.    Historical Provider, MD  sucralfate (CARAFATE) 1 G tablet Take 1 g by mouth 4 (four) times daily -  before meals and at bedtime.    Historical Provider, MD   BP 160/89  Pulse 130  Temp(Src) 98.4 F (36.9 C) (Oral)  Resp 18  SpO2 100% Physical Exam  Nursing note and vitals reviewed. Constitutional: He is oriented to person, place, and time. He appears well-developed and well-nourished. He appears distressed.  HENT:  Head: Normocephalic and atraumatic.  Mouth/Throat: Oropharynx is clear and moist. Mucous membranes are dry.  Eyes: Conjunctivae and EOM are normal. Pupils are equal, round, and reactive to light.  Neck: Normal range of motion. Neck supple.  Cardiovascular: Regular rhythm and intact distal pulses.  Tachycardia present.   No murmur heard. Pulmonary/Chest: Effort normal and breath sounds normal. No respiratory distress. He has no wheezes. He has no rales.  Abdominal: Soft. He exhibits no distension. Bowel sounds are decreased. There is tenderness in the epigastric area. There is no rebound, no guarding and no CVA tenderness.  Musculoskeletal: Normal range of motion. He exhibits no edema and no tenderness.  Neurological: He is alert and oriented to person, place, and time.  Skin: Skin is warm. No rash noted. He is diaphoretic. No erythema. There is pallor.  Psychiatric: He has a normal mood and affect. His behavior is normal.    ED Course  Procedures (including critical care time) Labs Review Labs Reviewed  CBC WITH DIFFERENTIAL - Abnormal; Notable for the following:    WBC 13.2 (*)    Neutrophils Relative % 85 (*)     Neutro Abs 11.3 (*)    Lymphocytes Relative 7 (*)    All other components within normal limits  COMPREHENSIVE METABOLIC PANEL - Abnormal; Notable for the following:    Glucose, Bld 294 (*)    Creatinine, Ser 1.52 (*)    GFR calc non Af Amer 57 (*)    GFR calc Af Amer 67 (*)    All other components within normal limits  BLOOD GAS, VENOUS - Abnormal; Notable for the following:    pH, Ven 7.340 (*)    pCO2, Ven 43.0 (*)    pO2, Ven 48.5 (*)    Acid-base deficit 2.6 (*)    All other components within normal limits  I-STAT CG4 LACTIC ACID, ED - Abnormal; Notable for the following:    Lactic Acid, Venous 3.74 (*)    All other components within normal limits  CBG MONITORING, ED - Abnormal; Notable for the following:  Glucose-Capillary 271 (*)    All other components within normal limits  LIPASE, BLOOD  URINALYSIS, ROUTINE W REFLEX MICROSCOPIC    Imaging Review Dg Abd Acute W/chest  05/30/2013   CLINICAL DATA:  Upper abdominal pain for the past few weeks recently increasing. Nausea and vomiting. Diaphoresis.  EXAM: ACUTE ABDOMEN SERIES (ABDOMEN 2 VIEW & CHEST 1 VIEW)  COMPARISON:  No priors.  FINDINGS: Lung volumes are normal. No consolidative airspace disease. No pleural effusions. No pneumothorax. No pulmonary nodule or mass noted. Pulmonary vasculature and the cardiomediastinal silhouette are within normal limits.  Gas and stool are seen scattered throughout the colon extending to the level of the distal rectum. No pathologic distension of small bowel is noted. No gross evidence of pneumoperitoneum. Surgical clips project over the right upper quadrant of the abdomen, likely from prior cholecystectomy.  IMPRESSION: 1.  Nonobstructive bowel gas pattern. 2. No pneumoperitoneum. 3. No radiographic evidence of acute cardiopulmonary disease.   Electronically Signed   By: Vinnie Langton M.D.   On: 05/30/2013 15:53     Date: 05/30/2013  Rate: 109  Rhythm: sinus tachycardia  QRS Axis: normal   Intervals: normal  ST/T Wave abnormalities: nonspecific T wave changes  Conduction Disutrbances:none  Narrative Interpretation:   Old EKG Reviewed: none available   MDM   Final diagnoses:  None    Pt presenting with hx of DM, gastroparesis and HTN who presents with vomiting intermittently and diarrhea for the last 2 weeks much worse today.  Pt is tachycardic, diaphoretic and dehydrated on exam with decreased bowel sounds and epigastric pain.  No peritoneal signs concerning for perforation however concern for possible obstruction vs severe gastroparesis vs colitis.  No prior hx of DKA but will r/o with VBG.  Pt over the last 2 weeks has also had dizzy spells with standing and walking and mild SOB since diarrhea and vomiting started.  Denies any CP and no sx currently while sitting.  Feel most likely related to dehydration and other issues but will get screening EKG.  CBC, CMP, lipase, UA, VBG, EKG and AAS pending.  Pt given IVF, zofran, dilaudid, protonix, reglan ordered.  8:31 PM Pt still having nausea but has not vomited for 30 min.  Still not feeling at baseline.  Pt has elevated lactate but o/w labs unremarkable except for hypergylcemia.  Will admit for ongoing hydration and nausea control.  Pt has persistent tachycardia  Blanchie Dessert, MD 05/30/13 2034

## 2013-05-30 NOTE — ED Notes (Signed)
Pt states that he has been having upper abd pain, NVD since this morning.  Hx of gastroparesis.  Pt is extremely diaphoretic.

## 2013-05-30 NOTE — ED Notes (Signed)
Pt reminded that we need a urine sample. Unable to obtain at this time.

## 2013-05-30 NOTE — ED Notes (Signed)
Hospitalist at bedside 

## 2013-05-31 ENCOUNTER — Encounter (HOSPITAL_COMMUNITY): Payer: Self-pay | Admitting: *Deleted

## 2013-05-31 DIAGNOSIS — N179 Acute kidney failure, unspecified: Secondary | ICD-10-CM

## 2013-05-31 LAB — CBC
HEMATOCRIT: 41.2 % (ref 39.0–52.0)
Hemoglobin: 13.9 g/dL (ref 13.0–17.0)
MCH: 27.3 pg (ref 26.0–34.0)
MCHC: 33.7 g/dL (ref 30.0–36.0)
MCV: 80.9 fL (ref 78.0–100.0)
Platelets: 257 10*3/uL (ref 150–400)
RBC: 5.09 MIL/uL (ref 4.22–5.81)
RDW: 13.2 % (ref 11.5–15.5)
WBC: 10.6 10*3/uL — AB (ref 4.0–10.5)

## 2013-05-31 LAB — GLUCOSE, CAPILLARY
Glucose-Capillary: 252 mg/dL — ABNORMAL HIGH (ref 70–99)
Glucose-Capillary: 205 mg/dL — ABNORMAL HIGH (ref 70–99)
Glucose-Capillary: 195 mg/dL — ABNORMAL HIGH (ref 70–99)
Glucose-Capillary: 200 mg/dL — ABNORMAL HIGH (ref 70–99)

## 2013-05-31 LAB — BASIC METABOLIC PANEL
BUN: 14 mg/dL (ref 6–23)
CHLORIDE: 103 meq/L (ref 96–112)
CO2: 26 meq/L (ref 19–32)
CREATININE: 1.41 mg/dL — AB (ref 0.50–1.35)
Calcium: 9.4 mg/dL (ref 8.4–10.5)
GFR calc non Af Amer: 63 mL/min — ABNORMAL LOW (ref >90)
GFR, EST AFRICAN AMERICAN: 73 mL/min — AB (ref >90)
Glucose, Bld: 197 mg/dL — ABNORMAL HIGH (ref 70–99)
POTASSIUM: 4.6 meq/L (ref 3.7–5.3)
SODIUM: 144 meq/L (ref 137–147)

## 2013-05-31 LAB — HEMOGLOBIN A1C
HEMOGLOBIN A1C: 9 % — AB (ref <5.7)
Mean Plasma Glucose: 212 mg/dL — ABNORMAL HIGH (ref <117)

## 2013-05-31 MED ORDER — HYDRALAZINE HCL 20 MG/ML IJ SOLN
15.0000 mg | Freq: Four times a day (QID) | INTRAMUSCULAR | Status: DC | PRN
Start: 2013-05-31 — End: 2013-06-02
  Filled 2013-05-31: qty 0.75

## 2013-05-31 MED ORDER — METOCLOPRAMIDE HCL 5 MG/ML IJ SOLN
10.0000 mg | Freq: Four times a day (QID) | INTRAMUSCULAR | Status: DC
  Administered 2013-05-31 – 2013-06-02 (×8): 10 mg via INTRAVENOUS
  Filled 2013-05-31 (×11): qty 2

## 2013-05-31 MED ORDER — HYDROMORPHONE HCL PF 1 MG/ML IJ SOLN
1.0000 mg | INTRAMUSCULAR | Status: DC | PRN
  Administered 2013-05-31 – 2013-06-02 (×6): 1 mg via INTRAVENOUS
  Filled 2013-05-31 (×6): qty 1

## 2013-05-31 MED ORDER — ONDANSETRON HCL 4 MG/2ML IJ SOLN: 4.0000 mg | Freq: Once | INTRAMUSCULAR | Status: DC

## 2013-05-31 MED ORDER — INSULIN ASPART 100 UNIT/ML ~~LOC~~ SOLN: 0.0000 [IU] | Freq: Every day | SUBCUTANEOUS | Status: DC

## 2013-05-31 MED ORDER — HYDRALAZINE HCL 20 MG/ML IJ SOLN
10.0000 mg | Freq: Four times a day (QID) | INTRAMUSCULAR | Status: DC | PRN
  Administered 2013-05-31 (×2): 10 mg via INTRAVENOUS
  Filled 2013-05-31 (×2): qty 0.5

## 2013-05-31 MED ORDER — ONDANSETRON 8 MG/NS 50 ML IVPB
8.0000 mg | Freq: Four times a day (QID) | INTRAVENOUS | Status: DC | PRN
  Administered 2013-05-31: 8 mg via INTRAVENOUS
  Filled 2013-05-31 (×2): qty 8

## 2013-05-31 MED ORDER — INSULIN ASPART 100 UNIT/ML ~~LOC~~ SOLN
0.0000 [IU] | Freq: Three times a day (TID) | SUBCUTANEOUS | Status: DC
  Administered 2013-05-31 – 2013-06-01 (×3): 3 [IU] via SUBCUTANEOUS
  Administered 2013-06-01: 5 [IU] via SUBCUTANEOUS

## 2013-05-31 MED ORDER — PROMETHAZINE HCL 25 MG/ML IJ SOLN
25.0000 mg | Freq: Four times a day (QID) | INTRAMUSCULAR | Status: DC | PRN
  Administered 2013-06-01: 25 mg via INTRAVENOUS
  Filled 2013-05-31: qty 1

## 2013-05-31 MED ORDER — PANTOPRAZOLE SODIUM 40 MG IV SOLR: 40.0000 mg | Freq: Once | INTRAVENOUS | Status: DC

## 2013-05-31 MED ORDER — METOCLOPRAMIDE HCL 5 MG/ML IJ SOLN: 10.0000 mg | Freq: Once | INTRAMUSCULAR | Status: DC

## 2013-05-31 MED ORDER — PROMETHAZINE HCL 25 MG/ML IJ SOLN: 25.0000 mg | Freq: Once | INTRAMUSCULAR | Status: DC

## 2013-05-31 MED ORDER — AMLODIPINE BESYLATE 10 MG PO TABS
10.0000 mg | ORAL_TABLET | Freq: Every day | ORAL | Status: DC
  Administered 2013-06-01 – 2013-06-02 (×2): 10 mg via ORAL
  Filled 2013-05-31 (×2): qty 1

## 2013-05-31 MED ORDER — METOPROLOL TARTRATE 1 MG/ML IV SOLN
5.0000 mg | Freq: Four times a day (QID) | INTRAVENOUS | Status: DC
  Administered 2013-05-31 – 2013-06-02 (×8): 5 mg via INTRAVENOUS
  Filled 2013-05-31 (×11): qty 5

## 2013-05-31 MED ORDER — ERYTHROMYCIN LACTOBIONATE 500 MG IV SOLR
250.0000 mg | Freq: Three times a day (TID) | INTRAVENOUS | Status: DC
  Administered 2013-05-31 – 2013-06-02 (×6): 250 mg via INTRAVENOUS
  Filled 2013-05-31 (×7): qty 5

## 2013-05-31 MED ORDER — SODIUM CHLORIDE 0.9 % IV SOLN
12.5000 mg | Freq: Once | INTRAVENOUS | Status: AC
  Administered 2013-06-01: 12.5 mg via INTRAVENOUS
  Filled 2013-05-31: qty 0.5

## 2013-05-31 MED ORDER — PROMETHAZINE HCL 25 MG/ML IJ SOLN
12.5000 mg | Freq: Four times a day (QID) | INTRAMUSCULAR | Status: DC | PRN
  Administered 2013-05-31 (×3): 12.5 mg via INTRAVENOUS
  Filled 2013-05-31 (×3): qty 1

## 2013-05-31 MED ORDER — AMLODIPINE BESYLATE 10 MG PO TABS
10.0000 mg | ORAL_TABLET | Freq: Every day | ORAL | Status: DC
  Filled 2013-05-31: qty 1

## 2013-05-31 MED ORDER — PANTOPRAZOLE SODIUM 40 MG IV SOLR
40.0000 mg | Freq: Two times a day (BID) | INTRAVENOUS | Status: DC
  Administered 2013-05-31 – 2013-06-02 (×4): 40 mg via INTRAVENOUS
  Filled 2013-05-31 (×6): qty 40

## 2013-05-31 MED ORDER — LORAZEPAM 2 MG/ML IJ SOLN: 1.0000 mg | Freq: Two times a day (BID) | INTRAMUSCULAR | Status: DC | PRN

## 2013-05-31 MED ORDER — HYDRALAZINE HCL 25 MG PO TABS
25.0000 mg | ORAL_TABLET | Freq: Three times a day (TID) | ORAL | Status: DC
  Filled 2013-05-31 (×3): qty 1

## 2013-05-31 NOTE — Progress Notes (Signed)
Paged Dr. Charlies Silvers to let her know that the patient is unable to take is po meds, blood pressure and antibiotics, and is still having a lot of nausea and vomiting even after give phenergan.  Not tolerating clear liquid diet.  Orders for IV medication received.

## 2013-05-31 NOTE — Progress Notes (Signed)
Inpatient Diabetes Program Recommendations  AACE/ADA: New Consensus Statement on Inpatient Glycemic Control (2013)  Target Ranges:  Prepandial:   less than 140 mg/dL      Peak postprandial:   less than 180 mg/dL (1-2 hours)      Critically ill patients:  140 - 180 mg/dL   Reason for Visit: Diabetes Coordinator consult  Diabetes history:  Outpatient Diabetes medications: Lantus 36 units every HS, Novolog 9-12 units TID with meals Current orders for Inpatient glycemic control: Novolog MODERATE correction scale AC & HS   Note: Noted that patient has been having nausea and vomiting at admission.  Takes Lantus 36 units every HS at home along with Novolog 9-12 units TID with meals.  If patient is not eating, continue Novolog MODERATE correction scale every 4 hours until eating.  If CBGs are greater than 180 mg/dl, may want to consider starting 1/2 of Lantus dose (15 units at bedtime) and then titrate up as needed. Will continue to follow while in hospital.  Harvel Ricks RN BSN CDE

## 2013-05-31 NOTE — Progress Notes (Addendum)
[facility] PROGRESS NOTE  Alesandro Petithomme [id number] DOB: 1975/07/30 DOA: 05/30/2013 PCP: No PCP Per Patient  Brief narrative: 38 year old male with past medical history of diabetes mellitus type I, morbid obesity who presented to [facility] ED 05/30/2013 with intractable nausea and vomiting as well as diarrhea for past few days prior to this admission. No fevers or chills. No reports of blood in stool. In ED, vitals are stable. Abd x ray did not reveal obstruction. His WBC count was mildly elevated at 13.2, no fevers. Lipase was WNL.  Assessment/Plan:  Principal Problem:   Intractable nausea and vomiting, diarrhea - possible viral gastroenteritis or gastroparesis - continue supportive care with IV fluids, antiemetics - diet is clear liquid Active Problems:   Diabetes mellitus, type I with complications of neuropathy - check A1c - continue SSI; appreciate diabetic coordinator consult - gabapentin for neuropathy    Gastroparesis - continue IV fluids, analgesia - continue Reglan and erythromycin    Hypertension  - increase Norvasc from 5 mg to 10 mg daily - stop lisinopril due to acute renal failure - continue metoprolol and clonidine   Acute renal failure - possible dehydration or lisinopril - stop lisinopril - continue IV fluids - check BMP in am   Code Status: full code Family Communication: no family at the bedside this am Disposition Plan: home when stable  Robbie Lis, MD  [facility] Pager [phone]  If 7PM-7AM, please contact night-coverage [email or web address] Password [email or web address] 05/31/2013, 10:13 AM   LOS: 1 day   Consultants:  None   Procedures:  None   Antibiotics:  None   HPI/Subjective: Says he feels little better this am, has no appetite.  Objective: Filed Vitals:   05/30/13 2100 05/31/13 0529 05/31/13 0540 05/31/13 0655  BP:  180/121 176/92 166/99  Pulse:  130 126 117  Temp: 98 F (36.7 C) 98.6 F (37 C)    TempSrc: Oral Oral    Resp:   18    Height: 6\' 4"  (1.93 m)     Weight: 131.543 kg (290 lb)     SpO2: 100%       Intake/Output Summary (Last 24 hours) at 05/31/13 1013 Last data filed at 05/31/13 0645  Gross per 24 hour  Intake 916.22 ml  Output    880 ml  Net  36.22 ml    Exam:   General:  Pt is alert, follows commands appropriately, not in acute distress  Cardiovascular: Regular rate and rhythm, S1/S2, no murmurs, no rubs, no gallops  Respiratory: Clear to auscultation bilaterally, no wheezing, no crackles, no rhonchi  Abdomen: Soft, non tender, non distended, bowel sounds present, no guarding  Extremities: No edema, pulses DP and PT palpable bilaterally  Neuro: Grossly nonfocal  Data Reviewed: Basic Metabolic Panel:  Recent Labs Lab 05/30/13 1823  NA 140  K 4.9  CL 101  CO2 22  GLUCOSE 294*  BUN 16  CREATININE 1.52*  CALCIUM 9.5   Liver Function Tests:  Recent Labs Lab 05/30/13 1823  AST 15  ALT 20  ALKPHOS 98  BILITOT 0.4  PROT 7.3  ALBUMIN 4.1    Recent Labs Lab 05/30/13 1823  LIPASE 15   No results found for this basename: AMMONIA,  in the last 168 hours CBC:  Recent Labs Lab 05/30/13 1823  WBC 13.2*  NEUTROABS 11.3*  HGB 13.9  HCT 40.4  MCV 80.5  PLT 282   Cardiac Enzymes: No results found for this basename: CKTOTAL, CKMB, CKMBINDEX,  TROPONINI,  in the last 168 hours BNP: No components found with this basename: POCBNP,  CBG:  Recent Labs Lab 05/30/13 1843 05/30/13 2233 05/31/13 0747  GLUCAP 271* 252* 205*    No results found for this or any previous visit (from the past 240 hour(s)).   Studies: Dg Abd Acute W/chest 05/30/2013   IMPRESSION: 1.  Nonobstructive bowel gas pattern. 2. No pneumoperitoneum. 3. No radiographic evidence of acute cardiopulmonary disease.   Electronically Signed   By: Vinnie Langton M.D.   On: 05/30/2013 15:53    Scheduled Meds: . amLODipine  5 mg Oral Daily  . cloNIDine  0.1 mg Oral BID  . enoxaparin (LOVENOX)   40 mg  Subcutaneous QHS  . erythromycin  250 mg Oral TID WC & HS  . gabapentin  600 mg Oral QHS  . insulin aspart  0-15 Units Subcutaneous TID WC  . insulin aspart  0-5 Units Subcutaneous QHS  . lisinopril  20 mg Oral Daily  . metoCLOPramide   10 mg Intravenous 3 times per day  . metoprolol succinate  50 mg Oral Daily  . pantoprazole   40 mg Intravenous QHS   Continuous Infusions: . sodium chloride 125 mL/hr at 05/31/13 0556

## 2013-05-31 NOTE — Progress Notes (Signed)
Spoke with staff RN about the diabetes coordinator consult.  Suggested that the Novolog MODERATE correction scale be changed to every 4 hours if patient not able to eat.   Referred her to my previous progress note.  Will continue to follow while in hospital.  Harvel Ricks RN BSN CDE

## 2013-06-01 LAB — GLUCOSE, CAPILLARY
GLUCOSE-CAPILLARY: 184 mg/dL — AB (ref 70–99)
GLUCOSE-CAPILLARY: 177 mg/dL — AB (ref 70–99)
Glucose-Capillary: 200 mg/dL — ABNORMAL HIGH (ref 70–99)
Glucose-Capillary: 164 mg/dL — ABNORMAL HIGH (ref 70–99)

## 2013-06-01 LAB — BASIC METABOLIC PANEL
BUN: 14 mg/dL (ref 6–23)
CO2: 26 meq/L (ref 19–32)
Calcium: 8.6 mg/dL (ref 8.4–10.5)
Chloride: 107 mEq/L (ref 96–112)
Creatinine, Ser: 1.28 mg/dL (ref 0.50–1.35)
GFR calc Af Amer: 82 mL/min — ABNORMAL LOW (ref >90)
GFR calc non Af Amer: 71 mL/min — ABNORMAL LOW (ref >90)
Glucose, Bld: 203 mg/dL — ABNORMAL HIGH (ref 70–99)
POTASSIUM: 4.2 meq/L (ref 3.7–5.3)
SODIUM: 143 meq/L (ref 137–147)

## 2013-06-01 LAB — CBC
HCT: 36.2 % — ABNORMAL LOW (ref 39.0–52.0)
Hemoglobin: 11.9 g/dL — ABNORMAL LOW (ref 13.0–17.0)
MCH: 27.1 pg (ref 26.0–34.0)
MCHC: 32.9 g/dL (ref 30.0–36.0)
MCV: 82.5 fL (ref 78.0–100.0)
PLATELETS: 230 10*3/uL (ref 150–400)
RBC: 4.39 MIL/uL (ref 4.22–5.81)
RDW: 13.4 % (ref 11.5–15.5)
WBC: 9 10*3/uL (ref 4.0–10.5)

## 2013-06-01 MED ORDER — INSULIN ASPART 100 UNIT/ML ~~LOC~~ SOLN
0.0000 [IU] | SUBCUTANEOUS | Status: DC
  Administered 2013-06-01: 3 [IU] via SUBCUTANEOUS

## 2013-06-01 MED ORDER — INSULIN GLARGINE 100 UNIT/ML ~~LOC~~ SOLN
18.0000 [IU] | Freq: Every day | SUBCUTANEOUS | Status: DC
  Administered 2013-06-01: 18 [IU] via SUBCUTANEOUS
  Filled 2013-06-01 (×2): qty 0.18

## 2013-06-01 MED ORDER — INSULIN ASPART 100 UNIT/ML ~~LOC~~ SOLN
0.0000 [IU] | Freq: Three times a day (TID) | SUBCUTANEOUS | Status: DC
  Administered 2013-06-02: 3 [IU] via SUBCUTANEOUS

## 2013-06-01 MED ORDER — INSULIN ASPART 100 UNIT/ML ~~LOC~~ SOLN
4.0000 [IU] | Freq: Three times a day (TID) | SUBCUTANEOUS | Status: DC
  Administered 2013-06-02: 4 [IU] via SUBCUTANEOUS

## 2013-06-01 NOTE — Progress Notes (Signed)
[facility] PROGRESS NOTE  Jahon Gaede [id number] DOB: Jun 21, 1975 DOA: 05/30/2013 PCP: No PCP Per Patient  Brief narrative: 38 year old male with past medical history of diabetes mellitus type I, morbid obesity who presented to [facility] ED 05/30/2013 with intractable nausea and vomiting as well as diarrhea for past few days prior to this admission. No fevers or chills. No reports of blood in stool.  In ED, vitals are stable. Abd x ray did not reveal obstruction. His WBC count was mildly elevated at 13.2, no fevers. Lipase was WNL.   Assessment/Plan:   Principal Problem:  Intractable nausea and vomiting, diarrhea  - possible viral gastroenteritis or gastroparesis  - continue supportive care with IV fluids, antiemetics  - diet advanced as tolerated Active Problems:  Diabetes mellitus, type I with complications of neuropathy  - A1c 9 on this admission indicating poor glycemic control  - continue SSI; appreciate diabetic coordinator consult and recommendations  - gabapentin for neuropathy  Gastroparesis  - will continue IV fluids, analgesia  - will continue Reglan and erythromycin  Hypertension  - increased Norvasc from 5 mg to 10 mg daily  - stopped lisinopril due to acute renal failure  - continue metoprolol, clonidine  - BP 152/82 this am prior to BP meds Acute renal failure  - possible dehydration or lisinopril  - stopped lisinopril  - creatinine now WNL  Code Status: full code  Family Communication: no family at the bedside this am  Disposition Plan: home when stable   Consultants:  None  Procedures:  None  Antibiotics:  None    Robbie Lis, MD  [facility] Pager [phone]  If 7PM-7AM, please contact night-coverage [email or web address] Password [email or web address] 06/01/2013, 12:20 PM   LOS: 2 days    HPI/Subjective: Still some nausea but no vomiting. Has not eaten much.   Objective: Filed Vitals:   06/01/13 0517 06/01/13 0900 06/01/13 1032 06/01/13 1033  BP:  127/69 145/88 152/82 152/82  Pulse: 106 91  85  Temp: 98.1 F (36.7 C) 98.3 F (36.8 C)    TempSrc: Oral Oral    Resp: 18 12    Height:      Weight:      SpO2: 98% 97%  96%    Intake/Output Summary (Last 24 hours) at 06/01/13 1220 Last data filed at 06/01/13 1100  Gross per 24 hour  Intake    120 ml  Output    900 ml  Net   -780 ml    Exam:   General:  Pt is alert, follows commands appropriately, not in acute distress  Cardiovascular: Regular rate and rhythm, S1/S2, no murmurs, no rubs, no gallops  Respiratory: Clear to auscultation bilaterally, no wheezing, no crackles, no rhonchi  Abdomen: Soft, non tender, non distended, bowel sounds present, no guarding  Extremities: No edema, pulses DP and PT palpable bilaterally  Neuro: Grossly nonfocal  Data Reviewed: Basic Metabolic Panel:  Recent Labs Lab 05/30/13 1823 05/31/13 1104 06/01/13 0433  NA 140 144 143  K 4.9 4.6 4.2  CL 101 103 107  CO2 22 26 26   GLUCOSE 294* 197* 203*  BUN 16 14 14   CREATININE 1.52* 1.41* 1.28  CALCIUM 9.5 9.4 8.6   Liver Function Tests:  Recent Labs Lab 05/30/13 1823  AST 15  ALT 20  ALKPHOS 98  BILITOT 0.4  PROT 7.3  ALBUMIN 4.1    Recent Labs Lab 05/30/13 1823  LIPASE 15   No results found for this basename: AMMONIA,  in the last 168 hours CBC:  Recent Labs Lab 05/30/13 1823 05/31/13 1104 06/01/13 0433  WBC 13.2* 10.6* 9.0  NEUTROABS 11.3*  --   --   HGB 13.9 13.9 11.9*  HCT 40.4 41.2 36.2*  MCV 80.5 80.9 82.5  PLT 282 257 230   Cardiac Enzymes: No results found for this basename: CKTOTAL, CKMB, CKMBINDEX, TROPONINI,  in the last 168 hours BNP: No components found with this basename: POCBNP,  CBG:  Recent Labs Lab 05/31/13 1110 05/31/13 1715 05/31/13 2117 06/01/13 0729 06/01/13 1118  GLUCAP 195* 200* 184* 177* 200*    No results found for this or any previous visit (from the past 240 hour(s)).   Studies: Dg Abd Acute W/chest 05/30/2013     IMPRESSION: 1.  Nonobstructive bowel gas pattern. 2. No pneumoperitoneum. 3. No radiographic evidence of acute cardiopulmonary disease.      Scheduled Meds: . amLODipine  10 mg Oral Daily  . cloNIDine  0.1 mg Oral BID  . erythromycin  250 mg Intravenous Q8H  . gabapentin  600 mg Oral QHS  . insulin aspart  0-15 Units Subcutaneous Q4H  . metoCLOPramide  10 mg Intravenous 4 times per day  . metoprolol  5 mg Intravenous 4 times per day  . pantoprazole   40 mg Intravenous Q12H   Continuous Infusions: . sodium chloride 125 mL/hr at 05/31/13 2057

## 2013-06-01 NOTE — Progress Notes (Signed)
Inpatient Diabetes Program Recommendations  AACE/ADA: New Consensus Statement on Inpatient Glycemic Control (2013)  Target Ranges:  Prepandial:   less than 140 mg/dL      Peak postprandial:   less than 180 mg/dL (1-2 hours)      Critically ill patients:  140 - 180 mg/dL   Reason for Visit: Hyperglycemia  Results for Duane [name], DOOD (MRN [id number]) as of 06/01/2013 15:01  Ref. Range 05/31/2013 11:10 05/31/2013 17:15 05/31/2013 21:17 06/01/2013 07:29 06/01/2013 11:18  Glucose-Capillary Latest Range: 70-99 mg/dL 195 (H) 200 (H) 184 (H) 177 (H) 200 (H)   Still feeling nauseated. Pt states his gastroparesis "is real bad." Said weight stays about the same, which surprises him since he vomits frequently.  Said he will need PCP to manage DM since he moved back to [place] where his mother lives. Very frustrated with feeling poorly. Did not have any diabetes related questions. He asked me to come back tomorrow am.  Inpatient Diabetes Program Recommendations Insulin - Basal: Add 1/2 home dose Lantus - Lantus 18 units QHS Insulin - Meal Coverage: Will need meal coverage insulin when po intake increases. Novolog 4 units tidwc if pt eats>50% meal HgbA1C: 9.0% Outpatient Referral: OP Diabetes Education consult for uncontrolled DM  Note: Will continue to follow. Thank you. Lorenda Peck, RD, LDN, CDE Inpatient Diabetes Coordinator [phone]

## 2013-06-02 LAB — GLUCOSE, CAPILLARY: Glucose-Capillary: 226 mg/dL — ABNORMAL HIGH (ref 70–99)

## 2013-06-02 MED ORDER — INSULIN GLARGINE 100 UNIT/ML ~~LOC~~ SOLN: 18.0000 [IU] | Freq: Every day | SUBCUTANEOUS | Status: DC

## 2013-06-02 MED ORDER — INSULIN ASPART 100 UNIT/ML ~~LOC~~ SOLN: 4.0000 [IU] | Freq: Three times a day (TID) | SUBCUTANEOUS | Status: DC

## 2013-06-02 MED ORDER — HYDROCODONE-ACETAMINOPHEN 5-325 MG PO TABS: 1.0000 | ORAL_TABLET | ORAL | Status: DC | PRN

## 2013-06-02 MED ORDER — AMLODIPINE BESYLATE 10 MG PO TABS
10.0000 mg | ORAL_TABLET | Freq: Every day | ORAL | Status: DC
Start: 2013-06-02 — End: 2013-08-16

## 2013-06-02 NOTE — Discharge Summary (Signed)
Physician Discharge Summary  Duane [name] [id number] DOB: 1975-02-29 DOA: 05/30/2013  PCP: No PCP Per Patient  Admit date: 05/30/2013 Discharge date: 06/02/2013  Recommendations for Outpatient Follow-up:  1. Pt instructed to follow up with PCP in 1 week from discharge to make sure symptoms are controlled 2. Pt was encouraged to be compliant with insulin regimen   Discharge Diagnoses:  Principal Problem:   Intractable nausea and vomiting Active Problems:   Gastroparesis   IDDM (insulin dependent diabetes mellitus)   HTN (hypertension)   ARF (acute renal failure)    Discharge Condition: stable, pt felt much better this am, no complaints of abd pain, N/V and tolerated PO intake; he asked to go home  Diet recommendation: diabetic diet  History of present illness:  38 year old male with past medical history of diabetes mellitus type I, morbid obesity who presented to [facility] ED 05/30/2013 with intractable nausea and vomiting as well as diarrhea for past few days prior to this admission. No fevers or chills. No reports of blood in stool.  In ED, vitals are stable. Abd x ray did not reveal obstruction. His WBC count was mildly elevated at 13.2, no fevers. Lipase was WNL.   Assessment/Plan:   Principal Problem:  Intractable nausea and vomiting, diarrhea  - possible viral gastroenteritis or gastroparesis  - no abdominal pain, nausea or vomiting this am - tolerated diabetic diet  Active Problems:  Diabetes mellitus, type I with complications of neuropathy  - A1c 9 on this admission indicating poor glycemic control  - per DM coordinator he was instructed to use lantus 18 units daily in addition to novolog 4 units TID - gabapentin for neuropathy  Gastroparesis  - will continue Reglan and erythromycin  Hypertension  - increased Norvasc from 5 mg to 10 mg daily  - stopped lisinopril due to acute renal failure  - continue metoprolol, clonidine  Acute renal failure  - possible  dehydration or lisinopril  - stopped lisinopril  - creatinine now WNL   Code Status: full code  Family Communication: no family at the bedside this am   Consultants:  None  Procedures:  None  Antibiotics:  None    Signed:  Robbie Lis, MD  [facility] 06/02/2013, 2:00 PM  Pager #: [phone]   Discharge Exam: Filed Vitals:   06/02/13 1356  BP: 152/76  Pulse: 83  Temp: 98.2 F (36.8 C)  Resp: 16   Filed Vitals:   06/02/13 0900 06/02/13 1025 06/02/13 1100 06/02/13 1356  BP: 136/84 136/84 156/103 152/76  Pulse: 91  79 83  Temp:    98.2 F (36.8 C)  TempSrc:    Oral  Resp: 12   16  Height:      Weight:      SpO2: 96%   94%    General: Pt is alert, follows commands appropriately, not in acute distress Cardiovascular: Regular rate and rhythm, S1/S2 +, no murmurs, no rubs, no gallops Respiratory: Clear to auscultation bilaterally, no wheezing, no crackles, no rhonchi Abdominal: Soft, non tender, non distended, bowel sounds +, no guarding Extremities: no edema, no cyanosis, pulses palpable bilaterally DP and PT Neuro: Grossly nonfocal  Discharge Instructions  Discharge Orders   Future Orders Complete By Expires   Call MD for:  difficulty breathing, headache or visual disturbances  As directed    Call MD for:  persistant dizziness or light-headedness  As directed    Call MD for:  persistant nausea and vomiting  As  directed    Call MD for:  redness, tenderness, or signs of infection (pain, swelling, redness, odor or green/yellow discharge around incision site)  As directed    Diet - low sodium heart healthy  As directed    Discharge instructions  As directed    Increase activity slowly  As directed        Medication List    STOP taking these medications       lisinopril 20 MG tablet  Commonly known as:  PRINIVIL,ZESTRIL      TAKE these medications       acetaminophen 500 MG tablet  Commonly known as:  TYLENOL  Take 500 mg by mouth every 6  (six) hours as needed for mild pain.     amLODipine 10 MG tablet  Commonly known as:  NORVASC  Take 1 tablet (10 mg total) by mouth daily.     cloNIDine 0.1 MG tablet  Commonly known as:  CATAPRES  Take 0.1 mg by mouth 2 (two) times daily.     gabapentin 300 MG capsule  Commonly known as:  NEURONTIN  Take 600 mg by mouth at bedtime.     HYDROcodone-acetaminophen 5-325 MG per tablet  Commonly known as:  NORCO/VICODIN  Take 1-2 tablets by mouth every 4 (four) hours as needed for moderate pain.     insulin aspart 100 UNIT/ML injection  Commonly known as:  novoLOG  Inject 4 Units into the skin 3 (three) times daily with meals.     insulin aspart 100 UNIT/ML injection  Commonly known as:  novoLOG  Inject 9-12 Units into the skin 3 (three) times daily before meals.     insulin glargine 100 UNIT/ML injection  Commonly known as:  LANTUS  Inject 0.18 mLs (18 Units total) into the skin at bedtime.     insulin glargine 100 UNIT/ML injection  Commonly known as:  LANTUS  Inject 0.18 mLs (18 Units total) into the skin at bedtime.     metoCLOPramide 10 MG tablet  Commonly known as:  REGLAN  Take 10 mg by mouth 4 (four) times daily.     metoprolol succinate 50 MG 24 hr tablet  Commonly known as:  TOPROL-XL  Take 50 mg by mouth daily. Take with or immediately following a meal.     pantoprazole 40 MG tablet  Commonly known as:  PROTONIX  Take 40 mg by mouth daily.     sucralfate 1 G tablet  Commonly known as:  CARAFATE  Take 1 g by mouth 4 (four) times daily -  before meals and at bedtime.           Follow-up Information   Follow up with [facility]    . Schedule an appointment as soon as possible for a visit in 2 weeks.   Contact information:   [place] [place] [id number] [phone]       The results of significant diagnostics from this hospitalization (including imaging, microbiology, ancillary and laboratory) are listed  below for reference.    Significant Diagnostic Studies: Dg Abd Acute W/chest  05/30/2013   CLINICAL DATA:  Upper abdominal pain for the past few weeks recently increasing. Nausea and vomiting. Diaphoresis.  EXAM: ACUTE ABDOMEN SERIES (ABDOMEN 2 VIEW & CHEST 1 VIEW)  COMPARISON:  No priors.  FINDINGS: Lung volumes are normal. No consolidative airspace disease. No pleural effusions. No pneumothorax. No pulmonary nodule or mass noted. Pulmonary vasculature and the cardiomediastinal silhouette  are within normal limits.  Gas and stool are seen scattered throughout the colon extending to the level of the distal rectum. No pathologic distension of small bowel is noted. No gross evidence of pneumoperitoneum. Surgical clips project over the right upper quadrant of the abdomen, likely from prior cholecystectomy.  IMPRESSION: 1.  Nonobstructive bowel gas pattern. 2. No pneumoperitoneum. 3. No radiographic evidence of acute cardiopulmonary disease.   Electronically Signed   By: Vinnie Langton M.D.   On: 05/30/2013 15:53    Microbiology: No results found for this or any previous visit (from the past 240 hour(s)).   Labs: Basic Metabolic Panel:  Recent Labs Lab 05/30/13 1823 05/31/13 1104 06/01/13 0433  NA 140 144 143  K 4.9 4.6 4.2  CL 101 103 107  CO2 22 26 26   GLUCOSE 294* 197* 203*  BUN 16 14 14   CREATININE 1.52* 1.41* 1.28  CALCIUM 9.5 9.4 8.6   Liver Function Tests:  Recent Labs Lab 05/30/13 1823  AST 15  ALT 20  ALKPHOS 98  BILITOT 0.4  PROT 7.3  ALBUMIN 4.1    Recent Labs Lab 05/30/13 1823  LIPASE 15   No results found for this basename: AMMONIA,  in the last 168 hours CBC:  Recent Labs Lab 05/30/13 1823 05/31/13 1104 06/01/13 0433  WBC 13.2* 10.6* 9.0  NEUTROABS 11.3*  --   --   HGB 13.9 13.9 11.9*  HCT 40.4 41.2 36.2*  MCV 80.5 80.9 82.5  PLT 282 257 230   Cardiac Enzymes: No results found for this basename: CKTOTAL, CKMB, CKMBINDEX, TROPONINI,  in the last  168 hours BNP: BNP (last 3 results) No results found for this basename: PROBNP,  in the last 8760 hours CBG:  Recent Labs Lab 05/31/13 2117 06/01/13 0729 06/01/13 1118 06/01/13 1631 06/02/13 1137  GLUCAP 184* 177* 200* 164* 226*    Time coordinating discharge: Over 30 minutes

## 2013-06-02 NOTE — Discharge Instructions (Signed)
Gastroparesis  Gastroparesis is also called slowed stomach emptying (delayed gastric emptying). It is a condition in which the stomach takes too long to empty its contents. It often happens in people with diabetes.  CAUSES  Gastroparesis happens when nerves to the stomach are damaged or stop working. When the nerves are damaged, the muscles of the stomach and intestines do not work normally. The movement of food is slowed or stopped. High blood glucose (sugar) causes changes in nerves and can damage the blood vessels that carry oxygen and nutrients to the nerves. RISK FACTORS  Diabetes.  Post-viral syndromes.  Eating disorders (anorexia, bulimia).  Surgery on the stomach or vagus nerve.  Gastroesophageal reflux disease (rarely).  Smooth muscle disorders (amyloidosis, scleroderma).  Metabolic disorders, including hypothyroidism.  Parkinson disease. SYMPTOMS   Heartburn.  Feeling sick to your stomach (nausea).  Vomiting of undigested food.  An early feeling of fullness when eating.  Weight loss.  Abdominal bloating.  Erratic blood glucose levels.  Lack of appetite.  Gastroesophageal reflux.  Spasms of the stomach wall. Complications can include:  Bacterial overgrowth in stomach. Food stays in the stomach and can ferment and cause bacteria to grow.  Weight loss due to difficulty digesting and absorbing nutrients.  Vomiting.  Obstruction in the stomach. Undigested food can harden and cause nausea and vomiting.  Blood glucose fluctuations caused by inconsistent food absorption. DIAGNOSIS  The diagnosis of gastroparesis is confirmed through one or more of the following tests:  Barium X-rays and scans. These tests look at how long it takes for food to move through the stomach.  Gastric manometry. This test measures electrical and muscular activity in the stomach. A thin tube is passed down the throat into the stomach. The tube contains a wire that takes measurements  of the stomach's electrical and muscular activity as it digests liquids and solid food.  Endoscopy. This procedure is done with a long, thin tube called an endoscope. It is passed through the mouth and gently down the esophagus into the stomach. This tube helps the caregiver look at the lining of the stomach to check for any abnormalities.  Ultrasonography. This can rule out gallbladder disease or pancreatitis. This test will outline and define the shape of the gallbladder and pancreas. TREATMENT   Treatments may include:  Exercise.  Medicines to control nausea and vomiting.  Medicines to stimulate stomach muscles.  Changes in what and when you eat.  Having smaller meals more often.  Eating low-fiber forms of high-fiber foods, such as eating cooked vegetables instead of raw vegetables.  Eating low-fat foods.  Consuming liquids, which are easier to digest.  In severe cases, feeding tubes and intravenous (IV) feeding may be needed. It is important to note that in most cases, treatment does not cure gastroparesis. It is usually a lasting (chronic) condition. Treatment helps you manage the underlying condition so that you can be as healthy and comfortable as possible. Other treatments  A gastric neurostimulator has been developed to assist people with gastroparesis. The battery-operated device is surgically implanted. It emits mild electrical pulses to help improve stomach emptying and to control nausea and vomiting.  The use of botulinum toxin has been shown to improve stomach emptying by decreasing the prolonged contractions of the muscle between the stomach and the small intestine (pyloric sphincter). The benefits are temporary. SEEK MEDICAL CARE IF:   You have diabetes and you are having problems keeping your blood glucose in goal range.  You are having nausea,  vomiting, bloating, or early feelings of fullness with eating.  Your symptoms do not change with a change in  diet. Document Released: 01/29/2005 Document Revised: 05/26/2012 Document Reviewed: 07/08/2008 [facility] Patient Information 2014 [place], [place]. Monitoring for Diabetes There are two blood tests that help you monitor and manage your diabetes. These include:  An A1c (hemoglobin A1c) test.  This test is an average of your glucose (or blood sugar) control over the past 3 months.  This is recommended as a way for you and your caregiver to understand how well your glucose levels are controlled on the average.  Your A1c goal will be determined by your caregiver, but it is usually best if it is less than 6.5% to 7.0%.  Glucose (sugar) attaches itself to red blood cells. The amount of glucose then can then be measured. The amount of glucose on the cells depends on how high your blood glucose has been.  SMBG test (self-monitoring blood glucose).  Using a blood glucose monitor (meter) to do SMBG testing is an easy way to monitor the amount of glucose in your blood and can help you improve your control. The monitor will tell you what your blood glucose is at that very moment. Every person with diabetes should have a blood glucose monitor and know how to use it. The better you control your blood sugar on a daily basis, the better your A1c levels will be. HOW OFTEN SHOULD I HAVE AN A1C LEVEL?  Every 3 months if your diabetes is not well controlled or if therapy has changed.  Every 6 months if you are meeting your treatment goals. HOW OFTEN SHOULD I DO SMBG TESTING?  Your caregiver will recommend how often you should test. Testing times are based on the kind of medicine you take, type of diabetes you have, and your blood glucose control. Testing times can include:  Type 1 diabetes: test 3 or 4 times a day or as directed.  Type 2 diabetes and if you are taking insulin and diabetes pills: test 3 or 4 times a day or as directed.  If you are taking diabetes pills only and not reaching your target A1c:  test 2 to 4 times a day or as directed.  If you are taking diabetes pills and are controlling your diabetes well with diet and exercise, your caregiver will help you decide what is appropriate. WHAT TIME OF DAY SHOULD I TEST?  The best time of day to test your blood glucose depends on medications, mealtimes, exercise, and blood glucose control. It is best to test at different times because this will help you know how you are doing throughout the day. Your caregiver will help you decide what is best. WHAT SHOULD MY BLOOD GLUCOSE BE? Blood glucose target goals may vary depending on each persons needs, whether they have type 1 or type 2 diabetes or what medications they are taking. However, as a general rule, blood glucose should be:  Before meals   70-130 mg/dl.  After meals    ..less than 180 mg/dl. CHECK YOUR BLOOD GLUCOSE IF:  You have symptoms of low blood sugar (hypoglycemia), which may include dizziness, shaking, sweating, chills and confusion.  You have symptoms of high blood sugar (hyperglycemia), which may include sleepiness, blurred vision, frequent urination and excessive thirst.  You are learning how meals, physical activity and medicine affect your blood glucose level. The more you learn about how various foods, your medications, and activities affect you, the better job you  will do of taking care of yourself.  You have a job in which poor control could cause safety problems while driving or operating machinery. CHECK YOUR BLOOD SUGAR MORE FREQUENTLY:  If you have medication or dietary changes.  If you begin taking other kinds of medicines.  If you become sick or your level of stress increases. With an illness, your blood sugar may even be high without eating.  Before and after exercise. Follow your caregiver's testing recommendations during this time.  TO DISPOSE OF SHARPS: Each city or state may have different regulations. Check with your public works or Social research officer, government.  Sharps containers can be purchased from pharmacies.  Place all used sharps in a container. You do not need to replace any protective covers over the needle or break the needle.  Sharps should be contained in a ridge, leakproof, puncture-resistant container.  Plastic detergent bottle.  Bleach bottle.  When container is almost full, add a solution that is 1 part laundry bleach and 9 parts tap water (it is okay to use undiluted bleach if you wish). You may want to wear gloves since bleach can damage tissue. Let the solution sit for 30 minutes.  Carefully pour all the liquid into the sanitary sewer. Be sure to prevent the sharps from falling out.  Once liquid is drained, reseal the container with lid and tape it shut with duct tape. This will prevent the cap from coming off.  Dispose of the container with your regular household trash and waste. It is a good idea to let your trash hauler know that you will be disposing of sharps. Document Released: 02/01/2003 Document Revised: 10/24/2011 Document Reviewed: 08/02/2008 [facility] Patient Information 2014 [place], [place].

## 2013-06-03 LAB — GLUCOSE, CAPILLARY
GLUCOSE-CAPILLARY: 151 mg/dL — AB (ref 70–99)
Glucose-Capillary: 169 mg/dL — ABNORMAL HIGH (ref 70–99)

## 2013-06-04 ENCOUNTER — Inpatient Hospital Stay (HOSPITAL_COMMUNITY): Payer: BC Managed Care – PPO

## 2013-06-04 ENCOUNTER — Encounter (HOSPITAL_COMMUNITY): Payer: Self-pay | Admitting: Emergency Medicine

## 2013-06-04 ENCOUNTER — Inpatient Hospital Stay (HOSPITAL_COMMUNITY)
Admission: EM | Admit: 2013-06-04 | Discharge: 2013-06-12 | DRG: 392 | Disposition: A | Discharge status: Home / Self Care | Payer: BC Managed Care – PPO | Attending: Internal Medicine | Admitting: Internal Medicine

## 2013-06-04 DIAGNOSIS — K449 Diaphragmatic hernia without obstruction or gangrene: Secondary | ICD-10-CM | POA: Diagnosis present

## 2013-06-04 DIAGNOSIS — Z79899 Other long term (current) drug therapy: Secondary | ICD-10-CM

## 2013-06-04 DIAGNOSIS — IMO0001 Reserved for inherently not codable concepts without codable children: Secondary | ICD-10-CM

## 2013-06-04 DIAGNOSIS — N179 Acute kidney failure, unspecified: Secondary | ICD-10-CM

## 2013-06-04 DIAGNOSIS — R112 Nausea with vomiting, unspecified: Secondary | ICD-10-CM

## 2013-06-04 DIAGNOSIS — Z9089 Acquired absence of other organs: Secondary | ICD-10-CM

## 2013-06-04 DIAGNOSIS — E1065 Type 1 diabetes mellitus with hyperglycemia: Secondary | ICD-10-CM

## 2013-06-04 DIAGNOSIS — K3184 Gastroparesis: Principal | ICD-10-CM

## 2013-06-04 DIAGNOSIS — K529 Noninfective gastroenteritis and colitis, unspecified: Secondary | ICD-10-CM

## 2013-06-04 DIAGNOSIS — E860 Dehydration: Secondary | ICD-10-CM

## 2013-06-04 DIAGNOSIS — E119 Type 2 diabetes mellitus without complications: Secondary | ICD-10-CM

## 2013-06-04 DIAGNOSIS — K5289 Other specified noninfective gastroenteritis and colitis: Secondary | ICD-10-CM | POA: Diagnosis present

## 2013-06-04 DIAGNOSIS — R1115 Cyclical vomiting syndrome unrelated to migraine: Secondary | ICD-10-CM

## 2013-06-04 DIAGNOSIS — R933 Abnormal findings on diagnostic imaging of other parts of digestive tract: Secondary | ICD-10-CM

## 2013-06-04 DIAGNOSIS — Z794 Long term (current) use of insulin: Secondary | ICD-10-CM

## 2013-06-04 DIAGNOSIS — E109 Type 1 diabetes mellitus without complications: Secondary | ICD-10-CM

## 2013-06-04 DIAGNOSIS — E1049 Type 1 diabetes mellitus with other diabetic neurological complication: Secondary | ICD-10-CM | POA: Diagnosis present

## 2013-06-04 DIAGNOSIS — I10 Essential (primary) hypertension: Secondary | ICD-10-CM

## 2013-06-04 DIAGNOSIS — Z6834 Body mass index (BMI) 34.0-34.9, adult: Secondary | ICD-10-CM

## 2013-06-04 DIAGNOSIS — E876 Hypokalemia: Secondary | ICD-10-CM

## 2013-06-04 LAB — CBC WITH DIFFERENTIAL/PLATELET
BASOS PCT: 0 % (ref 0–1)
Basophils Absolute: 0 10*3/uL (ref 0.0–0.1)
Eosinophils Absolute: 0.1 10*3/uL (ref 0.0–0.7)
Eosinophils Relative: 1 % (ref 0–5)
HCT: 36 % — ABNORMAL LOW (ref 39.0–52.0)
HEMOGLOBIN: 12.1 g/dL — AB (ref 13.0–17.0)
LYMPHS ABS: 1 10*3/uL (ref 0.7–4.0)
Lymphocytes Relative: 15 % (ref 12–46)
MCH: 26.3 pg (ref 26.0–34.0)
MCHC: 33.6 g/dL (ref 30.0–36.0)
MCV: 78.3 fL (ref 78.0–100.0)
Monocytes Absolute: 0.3 10*3/uL (ref 0.1–1.0)
Monocytes Relative: 4 % (ref 3–12)
NEUTROS ABS: 5.6 10*3/uL (ref 1.7–7.7)
NEUTROS PCT: 80 % — AB (ref 43–77)
Platelets: 207 10*3/uL (ref 150–400)
RBC: 4.6 MIL/uL (ref 4.22–5.81)
RDW: 12.6 % (ref 11.5–15.5)
WBC: 7 10*3/uL (ref 4.0–10.5)

## 2013-06-04 LAB — PHOSPHORUS: Phosphorus: 2.5 mg/dL (ref 2.3–4.6)

## 2013-06-04 LAB — URINALYSIS, ROUTINE W REFLEX MICROSCOPIC
Bilirubin Urine: NEGATIVE
Glucose, UA: >1000 mg/dL — AB
Hgb urine dipstick: NEGATIVE
Ketones, ur: NEGATIVE mg/dL
LEUKOCYTES UA: NEGATIVE
Nitrite: NEGATIVE
PROTEIN: NEGATIVE mg/dL
Specific Gravity, Urine: 1.025 (ref 1.005–1.030)
Urobilinogen, UA: 1 mg/dL (ref 0.0–1.0)
pH: 6.5 (ref 5.0–8.0)

## 2013-06-04 LAB — COMPREHENSIVE METABOLIC PANEL
ALBUMIN: 4 g/dL (ref 3.5–5.2)
ALK PHOS: 96 U/L (ref 39–117)
ALT: 17 U/L (ref 0–53)
AST: 12 U/L (ref 0–37)
BILIRUBIN TOTAL: 0.4 mg/dL (ref 0.3–1.2)
BUN: 9 mg/dL (ref 6–23)
CHLORIDE: 101 meq/L (ref 96–112)
CO2: 24 mEq/L (ref 19–32)
Calcium: 8.9 mg/dL (ref 8.4–10.5)
Creatinine, Ser: 1.12 mg/dL (ref 0.50–1.35)
GFR calc Af Amer: >90 mL/min (ref >90)
GFR calc non Af Amer: 83 mL/min — ABNORMAL LOW (ref >90)
GLUCOSE: 281 mg/dL — AB (ref 70–99)
POTASSIUM: 3.6 meq/L — AB (ref 3.7–5.3)
SODIUM: 140 meq/L (ref 137–147)
Total Protein: 7.2 g/dL (ref 6.0–8.3)

## 2013-06-04 LAB — MAGNESIUM: MAGNESIUM: 1.7 mg/dL (ref 1.5–2.5)

## 2013-06-04 LAB — LIPASE, BLOOD: Lipase: 25 U/L (ref 11–59)

## 2013-06-04 LAB — GLUCOSE, CAPILLARY
Glucose-Capillary: 268 mg/dL — ABNORMAL HIGH (ref 70–99)
Glucose-Capillary: 224 mg/dL — ABNORMAL HIGH (ref 70–99)

## 2013-06-04 LAB — URINE MICROSCOPIC-ADD ON: Urine-Other: NONE SEEN

## 2013-06-04 LAB — CBG MONITORING, ED: GLUCOSE-CAPILLARY: 248 mg/dL — AB (ref 70–99)

## 2013-06-04 MED ORDER — PROMETHAZINE HCL 25 MG/ML IJ SOLN
25.0000 mg | Freq: Four times a day (QID) | INTRAMUSCULAR | Status: DC | PRN
  Administered 2013-06-04 – 2013-06-11 (×14): 25 mg via INTRAVENOUS
  Filled 2013-06-04 (×14): qty 1

## 2013-06-04 MED ORDER — ENOXAPARIN SODIUM 80 MG/0.8ML ~~LOC~~ SOLN
65.0000 mg | SUBCUTANEOUS | Status: DC
  Administered 2013-06-04 – 2013-06-05 (×2): 65 mg via SUBCUTANEOUS
  Filled 2013-06-04 (×3): qty 0.8

## 2013-06-04 MED ORDER — HYDROMORPHONE HCL PF 1 MG/ML IJ SOLN
1.0000 mg | Freq: Once | INTRAMUSCULAR | Status: AC
  Administered 2013-06-04: 1 mg via INTRAVENOUS
  Filled 2013-06-04: qty 1

## 2013-06-04 MED ORDER — BISACODYL 10 MG RE SUPP: 10.0000 mg | Freq: Every day | RECTAL | Status: DC | PRN

## 2013-06-04 MED ORDER — SODIUM CHLORIDE 0.9 % IV SOLN: INTRAVENOUS | Status: DC

## 2013-06-04 MED ORDER — METOCLOPRAMIDE HCL 5 MG/ML IJ SOLN
10.0000 mg | Freq: Once | INTRAMUSCULAR | Status: AC
  Administered 2013-06-04: 10 mg via INTRAVENOUS
  Filled 2013-06-04: qty 2

## 2013-06-04 MED ORDER — MORPHINE SULFATE 2 MG/ML IJ SOLN
2.0000 mg | INTRAMUSCULAR | Status: DC | PRN
  Administered 2013-06-04 – 2013-06-05 (×3): 4 mg via INTRAVENOUS
  Filled 2013-06-04 (×4): qty 2

## 2013-06-04 MED ORDER — METOPROLOL TARTRATE 1 MG/ML IV SOLN
5.0000 mg | Freq: Three times a day (TID) | INTRAVENOUS | Status: DC
  Filled 2013-06-04 (×2): qty 5

## 2013-06-04 MED ORDER — SODIUM CHLORIDE 0.9 % IV BOLUS (SEPSIS)
2000.0000 mL | Freq: Once | INTRAVENOUS | Status: AC
Start: 2013-06-04 — End: 2013-06-04
  Administered 2013-06-04: 2000 mL via INTRAVENOUS

## 2013-06-04 MED ORDER — FLEET ENEMA 7-19 GM/118ML RE ENEM: 1.0000 | ENEMA | Freq: Once | RECTAL | Status: AC | PRN

## 2013-06-04 MED ORDER — ACETAMINOPHEN 325 MG PO TABS: 650.0000 mg | ORAL_TABLET | Freq: Four times a day (QID) | ORAL | Status: DC | PRN

## 2013-06-04 MED ORDER — ACETAMINOPHEN 650 MG RE SUPP: 650.0000 mg | Freq: Four times a day (QID) | RECTAL | Status: DC | PRN

## 2013-06-04 MED ORDER — HYDRALAZINE HCL 20 MG/ML IJ SOLN
10.0000 mg | Freq: Four times a day (QID) | INTRAMUSCULAR | Status: DC | PRN
  Administered 2013-06-04 – 2013-06-08 (×2): 10 mg via INTRAVENOUS
  Filled 2013-06-04 (×2): qty 0.5

## 2013-06-04 MED ORDER — PROMETHAZINE HCL 25 MG/ML IJ SOLN
25.0000 mg | Freq: Once | INTRAMUSCULAR | Status: AC
  Administered 2013-06-04: 25 mg via INTRAVENOUS
  Filled 2013-06-04: qty 1

## 2013-06-04 MED ORDER — METOCLOPRAMIDE HCL 5 MG/ML IJ SOLN
10.0000 mg | Freq: Four times a day (QID) | INTRAMUSCULAR | Status: DC
  Administered 2013-06-04 – 2013-06-07 (×10): 10 mg via INTRAVENOUS
  Filled 2013-06-04 (×18): qty 2

## 2013-06-04 MED ORDER — SODIUM CHLORIDE 0.9 % IV SOLN
INTRAVENOUS | Status: DC
  Administered 2013-06-04 – 2013-06-10 (×12): via INTRAVENOUS
  Filled 2013-06-04 (×18): qty 1000

## 2013-06-04 MED ORDER — METOPROLOL TARTRATE 1 MG/ML IV SOLN
5.0000 mg | Freq: Three times a day (TID) | INTRAVENOUS | Status: DC
  Administered 2013-06-04 – 2013-06-05 (×3): 5 mg via INTRAVENOUS
  Filled 2013-06-04 (×5): qty 5

## 2013-06-04 MED ORDER — MORPHINE SULFATE 2 MG/ML IJ SOLN
2.0000 mg | INTRAMUSCULAR | Status: DC | PRN
  Administered 2013-06-04: 2 mg via INTRAVENOUS
  Filled 2013-06-04: qty 1

## 2013-06-04 MED ORDER — DOCUSATE SODIUM 100 MG PO CAPS
100.0000 mg | ORAL_CAPSULE | Freq: Two times a day (BID) | ORAL | Status: DC
  Administered 2013-06-05 – 2013-06-09 (×4): 100 mg via ORAL
  Filled 2013-06-04 (×16): qty 1

## 2013-06-04 MED ORDER — INSULIN ASPART 100 UNIT/ML ~~LOC~~ SOLN
0.0000 [IU] | Freq: Three times a day (TID) | SUBCUTANEOUS | Status: DC
  Administered 2013-06-05 (×3): 4 [IU] via SUBCUTANEOUS
  Administered 2013-06-06 (×2): 3 [IU] via SUBCUTANEOUS
  Administered 2013-06-07 (×2): 4 [IU] via SUBCUTANEOUS
  Administered 2013-06-09: 1 [IU] via SUBCUTANEOUS
  Administered 2013-06-09 (×2): 3 [IU] via SUBCUTANEOUS
  Administered 2013-06-10: 4 [IU] via SUBCUTANEOUS
  Administered 2013-06-10: 3 [IU] via SUBCUTANEOUS
  Administered 2013-06-11: 4 [IU] via SUBCUTANEOUS
  Administered 2013-06-11: 3 [IU] via SUBCUTANEOUS
  Administered 2013-06-12 (×2): 7 [IU] via SUBCUTANEOUS
  Administered 2013-06-12: 4 [IU] via SUBCUTANEOUS

## 2013-06-04 MED ORDER — HYDRALAZINE HCL 20 MG/ML IJ SOLN
10.0000 mg | INTRAMUSCULAR | Status: AC
  Administered 2013-06-04: 10 mg via INTRAVENOUS
  Filled 2013-06-04: qty 0.5

## 2013-06-04 MED ORDER — ONDANSETRON 8 MG/NS 50 ML IVPB
8.0000 mg | Freq: Three times a day (TID) | INTRAVENOUS | Status: DC
  Administered 2013-06-04 – 2013-06-06 (×7): 8 mg via INTRAVENOUS
  Filled 2013-06-04 (×8): qty 8

## 2013-06-04 MED ORDER — INSULIN GLARGINE 100 UNIT/ML ~~LOC~~ SOLN
15.0000 [IU] | Freq: Every day | SUBCUTANEOUS | Status: DC
  Administered 2013-06-04 – 2013-06-11 (×8): 15 [IU] via SUBCUTANEOUS
  Filled 2013-06-04 (×9): qty 0.15

## 2013-06-04 MED ORDER — PROCHLORPERAZINE EDISYLATE 5 MG/ML IJ SOLN
10.0000 mg | Freq: Four times a day (QID) | INTRAMUSCULAR | Status: DC | PRN
  Administered 2013-06-04 – 2013-06-10 (×6): 10 mg via INTRAVENOUS
  Filled 2013-06-04 (×7): qty 2

## 2013-06-04 MED ORDER — PANTOPRAZOLE SODIUM 40 MG IV SOLR
40.0000 mg | Freq: Two times a day (BID) | INTRAVENOUS | Status: DC
  Administered 2013-06-04 – 2013-06-09 (×11): 40 mg via INTRAVENOUS
  Filled 2013-06-04 (×13): qty 40

## 2013-06-04 MED ORDER — PANTOPRAZOLE SODIUM 40 MG IV SOLR
40.0000 mg | Freq: Once | INTRAVENOUS | Status: AC
  Administered 2013-06-04: 40 mg via INTRAVENOUS
  Filled 2013-06-04: qty 40

## 2013-06-04 NOTE — ED Notes (Signed)
Called to give Report on Pt. Rn unavailable at this time.

## 2013-06-04 NOTE — ED Provider Notes (Signed)
CSN: [id number]     Arrival date & time 06/04/13  [id number] History   First MD Initiated Contact with Patient 06/04/13 1001     Chief Complaint  Patient presents with  . Emesis     (Consider location/radiation/quality/duration/timing/severity/associated sxs/prior Treatment) HPI Patient presents to the emergency department with persistent vomiting, since earlier today.  States, that he is also having abdominal pain.  The patient, states, that the vomiting started again last night to get worse.  This morning.  The patient, states, that he was recently admitted to the hospital for similar symptoms.  Patient, states she's not had any chest pain, shortness of breath, back pain, neck pain, fever, weakness, dizziness, numbness, rash, or syncope.  Patient, states, that he did not take any other medications other than the ones prescribed Past Medical History  Diagnosis Date  . Hypertension   . Diabetes mellitus without complication   . Gastritis   . Gastroparesis    Past Surgical History  Procedure Laterality Date  . Cholecystectomy    . Knee surgery Left    History reviewed. No pertinent family history. History  Substance Use Topics  . Smoking status: Never Smoker   . Smokeless tobacco: Never Used  . Alcohol Use: Yes    Review of Systems   All other systems negative except as documented in the HPI. All pertinent positives and negatives as reviewed in the HPI. Allergies  Review of patient's allergies indicates no known allergies.  Home Medications   Prior to Admission medications   Medication Sig Start Date End Date Taking? Authorizing Provider  acetaminophen (TYLENOL) 500 MG tablet Take 500 mg by mouth every 6 (six) hours as needed for mild pain.    Yes Historical Provider, MD  amLODipine (NORVASC) 10 MG tablet Take 1 tablet (10 mg total) by mouth daily. 06/02/13  Yes Robbie Lis, MD  cloNIDine (CATAPRES) 0.1 MG tablet Take 0.1 mg by mouth 2 (two) times daily.   Yes Historical  Provider, MD  gabapentin (NEURONTIN) 300 MG capsule Take 600 mg by mouth at bedtime.   Yes Historical Provider, MD  HYDROcodone-acetaminophen (NORCO/VICODIN) 5-325 MG per tablet Take 1-2 tablets by mouth every 4 (four) hours as needed for moderate pain. 06/02/13  Yes Robbie Lis, MD  insulin aspart (NOVOLOG) 100 UNIT/ML injection Inject 9-12 Units into the skin 3 (three) times daily before meals.   Yes Historical Provider, MD  insulin glargine (LANTUS) 100 UNIT/ML injection Inject 32 Units into the skin at bedtime.   Yes Historical Provider, MD  metoCLOPramide (REGLAN) 10 MG tablet Take 10 mg by mouth 4 (four) times daily.   Yes Historical Provider, MD  metoprolol succinate (TOPROL-XL) 50 MG 24 hr tablet Take 50 mg by mouth daily. Take with or immediately following a meal.   Yes Historical Provider, MD  pantoprazole (PROTONIX) 40 MG tablet Take 40 mg by mouth daily.   Yes Historical Provider, MD  sucralfate (CARAFATE) 1 G tablet Take 1 g by mouth 4 (four) times daily -  before meals and at bedtime.   Yes Historical Provider, MD   BP 181/97  Pulse 98  Temp(Src) 98.9 F (37.2 C)  Resp 20  SpO2 98% Physical Exam  Nursing note and vitals reviewed. Constitutional: He is oriented to person, place, and time. He appears well-developed and well-nourished. No distress.  HENT:  Head: Normocephalic and atraumatic.  Mouth/Throat: Oropharynx is clear and moist.  Eyes: Pupils are equal, round, and reactive to light.  Neck:  Normal range of motion. Neck supple.  Cardiovascular: Normal rate, regular rhythm and normal heart sounds.  Exam reveals no gallop and no friction rub.   No murmur heard. Pulmonary/Chest: Effort normal and breath sounds normal.  Neurological: He is alert and oriented to person, place, and time.  Skin: Skin is warm and dry.    ED Course  Procedures (including critical care time) Labs Review Labs Reviewed  CBC WITH DIFFERENTIAL - Abnormal; Notable for the following:     Hemoglobin 12.1 (*)    HCT 36.0 (*)    Neutrophils Relative % 80 (*)    All other components within normal limits  COMPREHENSIVE METABOLIC PANEL - Abnormal; Notable for the following:    Potassium 3.6 (*)    Glucose, Bld 281 (*)    GFR calc non Af Amer 83 (*)    All other components within normal limits  URINALYSIS, ROUTINE W REFLEX MICROSCOPIC - Abnormal; Notable for the following:    Glucose, UA >1000 (*)    All other components within normal limits  CBG MONITORING, ED - Abnormal; Notable for the following:    Glucose-Capillary 248 (*)    All other components within normal limits  URINE MICROSCOPIC-ADD ON  LIPASE, BLOOD    Imaging Review Dg Abd Acute W/chest  06/04/2013   CLINICAL DATA:  Abdominal pain and vomiting.  EXAM: ACUTE ABDOMEN SERIES (ABDOMEN 2 VIEW & CHEST 1 VIEW)  COMPARISON:  05/30/2013  FINDINGS: The lungs appear clear.  Cardiac and mediastinal contours normal.  No pleural effusion identified.  Cholecystectomy clip noted. Formed stool noted in the colon. No dilated bowel. Bowel gas pattern is unremarkable.  IMPRESSION: 1. No significant radiographic abnormality.   Electronically Signed   By: Sherryl Barters M.D.   On: 06/04/2013 16:00    Patient has been stable here in the emergency department other than, vomiting.  Patient be admitted to the hospital for further evaluation and care    Brent General, PA-C 06/05/13 1530

## 2013-06-04 NOTE — H&P (Signed)
[facility] History and Physical  Duane [name] [id number] DOB: Feb 29, 1975 DOA: 06/04/2013  Referring physician: Dr Aline Brochure PCP: No PCP Per Patient   Chief Complaint: Nausea and vomiting  HPI: Duane [name] is a 38 y.o. male  With history of diabetes mellitus hemoglobin A1c of 9.0 05/31/2013, gastroparesis per patient diagnosed in [place] in 2013, gastritis, hypertension who was recently hospitalized on 05/30/2013 to 06/02/2013 point active of nausea and vomiting felt to be secondary to gastroparesis who presents to the ED with several hour history of nausea intractable emesis since approximately 8 AM on the morning of admission. Patient does endorse some upper abdominal pain. Patient denies any fevers, no chills, no chest pain, no shortness of breath. Patient denies any hematochezia, no hemoptysis, no melena, no hematemesis. Patient denies any constipation. Patient states several weeks ago had some intermittent diarrhea which has since resolved. Patient does endorse some generalized weakness. Patient was seen in the emergency room comprehensive metabolic profile obtained potassium of 3.6 otherwise was within normal limits. Lipase level was within normal limits at 25. CBC was within normal limits. Urinalysis nitrate negative, leukocytosis negative. Patient was given some anti-emetics with no significant improvement. We were called to admit for further evaluation and rxcs.   Review of Systems:  as per history of present illness otherwise negative.  Constitutional:  No weight loss, night sweats, Fevers, chills, fatigue.  HEENT:  No headaches, Difficulty swallowing,Tooth/dental problems,Sore throat,  No sneezing, itching, ear ache, nasal congestion, post nasal drip,  Cardio-vascular:  No chest pain, Orthopnea, PND, swelling in lower extremities, anasarca, dizziness, palpitations  GI:  No heartburn, indigestion, abdominal pain, nausea, vomiting, diarrhea, change in bowel habits, loss  of appetite  Resp:  No shortness of breath with exertion or at rest. No excess mucus, no productive cough, No non-productive cough, No coughing up of blood.No change in color of mucus.No wheezing.No chest wall deformity  Skin:  no rash or lesions.  GU:  no dysuria, change in color of urine, no urgency or frequency. No flank pain.  Musculoskeletal:  No joint pain or swelling. No decreased range of motion. No back pain.  Psych:  No change in mood or affect. No depression or anxiety. No memory loss.   Past Medical History  Diagnosis Date  . Hypertension   . Diabetes mellitus without complication   . Gastritis   . Gastroparesis    Past Surgical History  Procedure Laterality Date  . Cholecystectomy    . Knee surgery Left    Social History:  reports that he has never smoked. He has never used smokeless tobacco. He reports that he drinks alcohol. He reports that he does not use illicit drugs.  No Known Allergies  History reviewed. No pertinent family history.   Prior to Admission medications   Medication Sig Start Date End Date Taking? Authorizing Provider  acetaminophen (TYLENOL) 500 MG tablet Take 500 mg by mouth every 6 (six) hours as needed for mild pain.    Yes Historical Provider, MD  amLODipine (NORVASC) 10 MG tablet Take 1 tablet (10 mg total) by mouth daily. 06/02/13  Yes Robbie Lis, MD  cloNIDine (CATAPRES) 0.1 MG tablet Take 0.1 mg by mouth 2 (two) times daily.   Yes Historical Provider, MD  gabapentin (NEURONTIN) 300 MG capsule Take 600 mg by mouth at bedtime.   Yes Historical Provider, MD  HYDROcodone-acetaminophen (NORCO/VICODIN) 5-325 MG per tablet Take 1-2 tablets by mouth every 4 (four) hours as needed for moderate pain. 06/02/13  Yes Robbie Lis, MD  insulin aspart (NOVOLOG) 100 UNIT/ML injection Inject 9-12 Units into the skin 3 (three) times daily before meals.   Yes Historical Provider, MD  insulin glargine (LANTUS) 100 UNIT/ML injection Inject 32 Units into the  skin at bedtime.   Yes Historical Provider, MD  metoCLOPramide (REGLAN) 10 MG tablet Take 10 mg by mouth 4 (four) times daily.   Yes Historical Provider, MD  metoprolol succinate (TOPROL-XL) 50 MG 24 hr tablet Take 50 mg by mouth daily. Take with or immediately following a meal.   Yes Historical Provider, MD  pantoprazole (PROTONIX) 40 MG tablet Take 40 mg by mouth daily.   Yes Historical Provider, MD  sucralfate (CARAFATE) 1 G tablet Take 1 g by mouth 4 (four) times daily -  before meals and at bedtime.   Yes Historical Provider, MD   Physical Exam: Filed Vitals:   06/04/13 1630  BP: 185/99  Pulse:   Temp:   Resp:     BP 185/99  Pulse 98  Temp(Src) 98.9 F (37.2 C)  Resp 20  SpO2 98%  General:  patient laying on a gurney in no cardiopulmonary distress and vomiting.  Eyes: PERRLA, EOMI, normal lids, irises & conjunctiva ENT: grossly normal hearing, lips & tongue Neck: no LAD, masses or thyromegaly Cardiovascular: Tachycardic, no m/r/g. No LE edema. Respiratory: CTA bilaterally, no w/r/r. Normal respiratory effort. Abdomen: soft, mild tenderness to palpation in the upper abdomen, positive bowel sounds, no rebound, no guarding.  Skin: no rash or induration seen on limited exam Musculoskeletal: grossly normal tone BUE/BLE Psychiatric: grossly normal mood and affect, speech fluent and appropriate Neurologic:  alert and oriented x3. Cranial nerves II through XII are grossly intact. Sensation is intact. Visual fields are intact. No focal deficits.           Labs on Admission:  Basic Metabolic Panel:  Recent Labs Lab 05/30/13 1823 05/31/13 1104 06/01/13 0433 06/04/13 1223  NA 140 144 143 140  K 4.9 4.6 4.2 3.6*  CL 101 103 107 101  CO2 22 26 26 24   GLUCOSE 294* 197* 203* 281*  BUN 16 14 14 9   CREATININE 1.52* 1.41* 1.28 1.12  CALCIUM 9.5 9.4 8.6 8.9   Liver Function Tests:  Recent Labs Lab 05/30/13 1823 06/04/13 1223  AST 15 12  ALT 20 17  ALKPHOS 98 96    BILITOT 0.4 0.4  PROT 7.3 7.2  ALBUMIN 4.1 4.0    Recent Labs Lab 05/30/13 1823 06/04/13 1235  LIPASE 15 25   No results found for this basename: AMMONIA,  in the last 168 hours CBC:  Recent Labs Lab 05/30/13 1823 05/31/13 1104 06/01/13 0433 06/04/13 1223  WBC 13.2* 10.6* 9.0 7.0  NEUTROABS 11.3*  --   --  5.6  HGB 13.9 13.9 11.9* 12.1*  HCT 40.4 41.2 36.2* 36.0*  MCV 80.5 80.9 82.5 78.3  PLT 282 257 230 207   Cardiac Enzymes: No results found for this basename: CKTOTAL, CKMB, CKMBINDEX, TROPONINI,  in the last 168 hours  BNP (last 3 results) No results found for this basename: PROBNP,  in the last 8760 hours CBG:  Recent Labs Lab 06/01/13 1631 06/01/13 2156 06/02/13 0713 06/02/13 1137 06/04/13 1322  GLUCAP 164* 169* 151* 226* 248*    Radiological Exams on Admission: Dg Abd Acute W/chest  06/04/2013   CLINICAL DATA:  Abdominal pain and vomiting.  EXAM: ACUTE ABDOMEN SERIES (ABDOMEN 2 VIEW & CHEST 1 VIEW)  COMPARISON:  05/30/2013  FINDINGS: The lungs appear clear.  Cardiac and mediastinal contours normal.  No pleural effusion identified.  Cholecystectomy clip noted. Formed stool noted in the colon. No dilated bowel. Bowel gas pattern is unremarkable.  IMPRESSION: 1. No significant radiographic abnormality.   Electronically Signed   By: Sherryl Barters M.D.   On: 06/04/2013 16:00    EKG: none   Assessment/Plan Principal Problem:   Intractable nausea and vomiting Active Problems:   Gastroparesis   IDDM (insulin dependent diabetes mellitus)   HTN (hypertension)   Dehydration   Hypokalemia  #1 intractable nausea and vomiting Questionable etiology. Likely secondary to gastroparesis. Acute abdominal series was negative for any bowel obstruction. Will admit the patient to a MedSurg floor. Bowel rest. Place on IV fluids. Place on Zofran 8 mg IV every 8 hours. Place on Reglan 10 mg IV 6 hours. Antiemetics. Supportive care.  #2 history of gastroparesis Likely  etiology of problem #1. Place on Reglan 10 mg IV every 6 hours. Once nausea and vomiting has resolved may consider a gastric emptying study. Follow.  #3 poorly controlled insulin-dependent diabetes mellitus Hemoglobin A1c was 9.0 05/31/2013. Will place on half home dose of Lantus at 15 units daily as patient is currently on bowel rest. Sliding scale insulin. Consult with diabetic coordinator.   #4 hypertension Place on IV Lopressor. Hydralazine as needed.  #5 dehydration IV fluids.  #6 hypokalemia Check a magnesium level replete.  #7 prophylaxis PPI for GI prophylaxis. Lovenox for DVT prophylaxis.   Code Status: Full Family Communication: Updated patient no family at bedside.  Disposition Plan: Admit to medsurg  Time spent: 60 mins  Eugenie Filler MD [facility] Pager [phone]

## 2013-06-04 NOTE — ED Notes (Signed)
Vomiting since 8 am; abd pain;

## 2013-06-05 LAB — BASIC METABOLIC PANEL
BUN: 7 mg/dL (ref 6–23)
CO2: 24 mEq/L (ref 19–32)
Calcium: 8.9 mg/dL (ref 8.4–10.5)
Chloride: 101 mEq/L (ref 96–112)
Creatinine, Ser: 1.02 mg/dL (ref 0.50–1.35)
GFR calc non Af Amer: >90 mL/min (ref >90)
GLUCOSE: 180 mg/dL — AB (ref 70–99)
POTASSIUM: 4 meq/L (ref 3.7–5.3)
Sodium: 139 mEq/L (ref 137–147)

## 2013-06-05 LAB — GLUCOSE, CAPILLARY
GLUCOSE-CAPILLARY: 171 mg/dL — AB (ref 70–99)
GLUCOSE-CAPILLARY: 161 mg/dL — AB (ref 70–99)
GLUCOSE-CAPILLARY: 154 mg/dL — AB (ref 70–99)
Glucose-Capillary: 115 mg/dL — ABNORMAL HIGH (ref 70–99)
Glucose-Capillary: 186 mg/dL — ABNORMAL HIGH (ref 70–99)
Glucose-Capillary: 198 mg/dL — ABNORMAL HIGH (ref 70–99)

## 2013-06-05 LAB — CBC
HEMATOCRIT: 36 % — AB (ref 39.0–52.0)
HEMOGLOBIN: 12.3 g/dL — AB (ref 13.0–17.0)
MCH: 26.9 pg (ref 26.0–34.0)
MCHC: 34.2 g/dL (ref 30.0–36.0)
MCV: 78.8 fL (ref 78.0–100.0)
Platelets: 229 10*3/uL (ref 150–400)
RBC: 4.57 MIL/uL (ref 4.22–5.81)
RDW: 12.8 % (ref 11.5–15.5)
WBC: 9.1 10*3/uL (ref 4.0–10.5)

## 2013-06-05 MED ORDER — MAGNESIUM SULFATE 40 MG/ML IJ SOLN
2.0000 g | Freq: Once | INTRAMUSCULAR | Status: AC
  Administered 2013-06-05: 2 g via INTRAVENOUS
  Filled 2013-06-05: qty 50

## 2013-06-05 MED ORDER — HYDROMORPHONE HCL PF 1 MG/ML IJ SOLN
1.0000 mg | INTRAMUSCULAR | Status: DC | PRN
  Administered 2013-06-05 (×3): 2 mg via INTRAVENOUS
  Administered 2013-06-06: 1 mg via INTRAVENOUS
  Administered 2013-06-06 (×2): 2 mg via INTRAVENOUS
  Administered 2013-06-06 – 2013-06-07 (×2): 1 mg via INTRAVENOUS
  Administered 2013-06-07 (×3): 2 mg via INTRAVENOUS
  Administered 2013-06-08: 1 mg via INTRAVENOUS
  Administered 2013-06-08: 2 mg via INTRAVENOUS
  Administered 2013-06-08: 1 mg via INTRAVENOUS
  Administered 2013-06-08 – 2013-06-09 (×5): 2 mg via INTRAVENOUS
  Filled 2013-06-05 (×6): qty 2
  Filled 2013-06-05: qty 1
  Filled 2013-06-05 (×8): qty 2
  Filled 2013-06-05: qty 1
  Filled 2013-06-05 (×2): qty 2
  Filled 2013-06-05: qty 1
  Filled 2013-06-05: qty 2

## 2013-06-05 MED ORDER — METOPROLOL TARTRATE 1 MG/ML IV SOLN
7.5000 mg | Freq: Three times a day (TID) | INTRAVENOUS | Status: DC
  Administered 2013-06-05 – 2013-06-06 (×4): 7.5 mg via INTRAVENOUS
  Filled 2013-06-05 (×8): qty 10

## 2013-06-05 NOTE — Progress Notes (Signed)
Inpatient Diabetes Program Recommendations  AACE/ADA: New Consensus Statement on Inpatient Glycemic Control (2013)  Target Ranges:  Prepandial:   less than 140 mg/dL      Peak postprandial:   less than 180 mg/dL (1-2 hours)      Critically ill patients:  140 - 180 mg/dL   Reason for Visit: Diabetes Consult  Diabetes history: DM2 Outpatient Diabetes medications: Lantus 32 QHS and Novolog s/s  Current orders for Inpatient glycemic control: Lantus 15 QHS and Novolog resistant tidwc  Familiar with pt from previous admission this week. States he took his insulin, but continued to feel nauseated, hence readmission. Pt was instructed to call [facility] for appt regarding his diabetes management.  Results for [name], Duane [name] (MRN [id number]) as of 06/05/2013 13:55  Ref. Range 06/04/2013 17:59 06/04/2013 20:22 06/05/2013 00:01 06/05/2013 04:21 06/05/2013 08:23  Glucose-Capillary Latest Range: 70-99 mg/dL 268 (H) 224 (H) 198 (H) 171 (H) 186 (H)  Results for [name], [name] (MRN [id number]) as of 06/05/2013 13:55  Ref. Range 05/31/2013 11:04  Hemoglobin A1C Latest Range: <5.7 % 9.0 (H)   Blood sugars improved from yesterday. CBGs reasonable at present.  Pt is NPO.   Recommend Novolog resistant Q4H while NPO, then tidwc and hs. Titrate Lantus if CBGs >180 mg/dL. To f/u at [facility] for diabetes management.  Will follow while inpatient. Thank you. Lorenda Peck, RD, LDN, CDE Inpatient Diabetes Coordinator [phone]

## 2013-06-05 NOTE — Progress Notes (Signed)
[facility] PROGRESS NOTE  Duane [name] [id number] DOB: [birth year] DOA: 06/04/2013 PCP: No PCP Per Patient  Assessment/Plan: #1 intractable nausea and vomiting Questionable etiology. May be secondary to gastroparesis. Acute abdominal series negative for any obstruction.  Patient still with nausea vomiting and abdominal pain. Will continue scheduled Zofran, IV Reglan, IV fluids, anti-emetics, supportive care. If patient's symptoms worsen and no improvement in the next 24-48 hours may need to get a CT of the abdomen and pelvis for further delineation.  #2 hypertension Continue IV Lopressor. Hydralazine as needed. Pain management.  #3 history of gastroparesis Continue IV Reglan.  #4 poorly-controlled insulin-dependent diabetes mellitus Hemoglobin A1c 9.0  05/31/2013. CBG 171-198. Continue Lantus and sliding scale insulin.  #5 dehydration IV fluids.  #6 hypokalemia Repleted.  #7 prophylaxis PPI for GI prophylaxis. Lovenox for DVT prophylaxis.   Code Status: Full Family Communication: Updated patient no family at bedside. Disposition Plan: Home when medically stable.   Consultants:  None  Procedures:  Acute abdominal series 06/04/2013  Antibiotics:  None  HPI/Subjective: Patient states still with intractable nausea and emesis. Patient states morphine is not helping his abdominal pain and patient requesting Dilaudid for abdominal pain.  Objective: Filed Vitals:   06/05/13 0627  BP: 165/91  Pulse: 102  Temp:   Resp:     Intake/Output Summary (Last 24 hours) at 06/05/13 1233 Last data filed at 06/05/13 0604  Gross per 24 hour  Intake 1647.46 ml  Output    650 ml  Net 997.46 ml   Filed Weights   06/04/13 1700 06/05/13 0548  Weight: 131.09 kg (289 lb) 128.7 kg (283 lb 11.7 oz)    Exam:   General:  NAD  Cardiovascular: RRR  Respiratory: CTAB  Abdomen: Soft, some tenderness in the upper abdomen, positive bowel sounds,  nondistended.  Musculoskeletal: No clubbing cyanosis or edema  Data Reviewed: Basic Metabolic Panel:  Recent Labs Lab 05/30/13 1823 05/31/13 1104 06/01/13 0433 06/04/13 1223 06/05/13 0438  NA 140 144 143 140 139  K 4.9 4.6 4.2 3.6* 4.0  CL 101 103 107 101 101  CO2 22 26 26 24 24   GLUCOSE 294* 197* 203* 281* 180*  BUN 16 14 14 9 7   CREATININE 1.52* 1.41* 1.28 1.12 1.02  CALCIUM 9.5 9.4 8.6 8.9 8.9  MG  --   --   --  1.7  --   PHOS  --   --   --  2.5  --    Liver Function Tests:  Recent Labs Lab 05/30/13 1823 06/04/13 1223  AST 15 12  ALT 20 17  ALKPHOS 98 96  BILITOT 0.4 0.4  PROT 7.3 7.2  ALBUMIN 4.1 4.0    Recent Labs Lab 05/30/13 1823 06/04/13 1235  LIPASE 15 25   No results found for this basename: AMMONIA,  in the last 168 hours CBC:  Recent Labs Lab 05/30/13 1823 05/31/13 1104 06/01/13 0433 06/04/13 1223 06/05/13 0438  WBC 13.2* 10.6* 9.0 7.0 9.1  NEUTROABS 11.3*  --   --  5.6  --   HGB 13.9 13.9 11.9* 12.1* 12.3*  HCT 40.4 41.2 36.2* 36.0* 36.0*  MCV 80.5 80.9 82.5 78.3 78.8  PLT 282 257 230 207 229   Cardiac Enzymes: No results found for this basename: CKTOTAL, CKMB, CKMBINDEX, TROPONINI,  in the last 168 hours BNP (last 3 results) No results found for this basename: PROBNP,  in the last 8760 hours CBG:  Recent Labs Lab 06/04/13 1759 06/04/13 2022 06/05/13  0001 06/05/13 0421 06/05/13 0823  GLUCAP 268* 224* 198* 171* 186*    No results found for this or any previous visit (from the past 240 hour(s)).   Studies: Dg Abd Acute W/chest  06/04/2013   CLINICAL DATA:  Abdominal pain and vomiting.  EXAM: ACUTE ABDOMEN SERIES (ABDOMEN 2 VIEW & CHEST 1 VIEW)  COMPARISON:  05/30/2013  FINDINGS: The lungs appear clear.  Cardiac and mediastinal contours normal.  No pleural effusion identified.  Cholecystectomy clip noted. Formed stool noted in the colon. No dilated bowel. Bowel gas pattern is unremarkable.  IMPRESSION: 1. No significant  radiographic abnormality.   Electronically Signed   By: Sherryl Barters M.D.   On: 06/04/2013 16:00    Scheduled Meds: . docusate sodium  100 mg Oral BID  . enoxaparin (LOVENOX) injection  65 mg Subcutaneous Q24H  . insulin aspart  0-20 Units Subcutaneous TID WC  . insulin glargine  15 Units Subcutaneous QHS  . metoCLOPramide (REGLAN) injection  10 mg Intravenous 4 times per day  . metoprolol  5 mg Intravenous 3 times per day  . ondansetron (ZOFRAN) IV  8 mg Intravenous 3 times per day  . pantoprazole (PROTONIX) IV  40 mg Intravenous Q12H   Continuous Infusions: . sodium chloride 0.9 % 1,000 mL with potassium chloride 40 mEq infusion 125 mL/hr at 06/05/13 1109    Principal Problem:   Intractable nausea and vomiting Active Problems:   Gastroparesis   IDDM (insulin dependent diabetes mellitus)   HTN (hypertension)   Dehydration   Hypokalemia    Time spent: [place] MD [facility] Pager [phone]. If 7PM-7AM, please contact night-coverage at [email or web address], password [facility] 06/05/2013, 12:33 PM  LOS: 1 day

## 2013-06-06 DIAGNOSIS — E860 Dehydration: Secondary | ICD-10-CM

## 2013-06-06 LAB — CBC
HCT: 37.9 % — ABNORMAL LOW (ref 39.0–52.0)
Hemoglobin: 12.5 g/dL — ABNORMAL LOW (ref 13.0–17.0)
MCH: 26.8 pg (ref 26.0–34.0)
MCHC: 33 g/dL (ref 30.0–36.0)
MCV: 81.2 fL (ref 78.0–100.0)
PLATELETS: 209 10*3/uL (ref 150–400)
RBC: 4.67 MIL/uL (ref 4.22–5.81)
RDW: 13.2 % (ref 11.5–15.5)
WBC: 7.8 10*3/uL (ref 4.0–10.5)

## 2013-06-06 LAB — MAGNESIUM: Magnesium: 2.2 mg/dL (ref 1.5–2.5)

## 2013-06-06 LAB — GLUCOSE, CAPILLARY
GLUCOSE-CAPILLARY: 141 mg/dL — AB (ref 70–99)
GLUCOSE-CAPILLARY: 169 mg/dL — AB (ref 70–99)
Glucose-Capillary: 126 mg/dL — ABNORMAL HIGH (ref 70–99)
Glucose-Capillary: 107 mg/dL — ABNORMAL HIGH (ref 70–99)

## 2013-06-06 LAB — BASIC METABOLIC PANEL
BUN: 10 mg/dL (ref 6–23)
CALCIUM: 8.9 mg/dL (ref 8.4–10.5)
CHLORIDE: 104 meq/L (ref 96–112)
CO2: 24 meq/L (ref 19–32)
Creatinine, Ser: 1.21 mg/dL (ref 0.50–1.35)
GFR calc Af Amer: 88 mL/min — ABNORMAL LOW (ref >90)
GFR calc non Af Amer: 76 mL/min — ABNORMAL LOW (ref >90)
Glucose, Bld: 161 mg/dL — ABNORMAL HIGH (ref 70–99)
Potassium: 4.6 mEq/L (ref 3.7–5.3)
Sodium: 138 mEq/L (ref 137–147)

## 2013-06-06 MED ORDER — ENOXAPARIN SODIUM 80 MG/0.8ML ~~LOC~~ SOLN
65.0000 mg | SUBCUTANEOUS | Status: DC
  Administered 2013-06-06 – 2013-06-09 (×4): 65 mg via SUBCUTANEOUS
  Filled 2013-06-06 (×5): qty 0.8

## 2013-06-06 NOTE — Progress Notes (Signed)
[facility] PROGRESS NOTE  Duane [name] [id number] DOB: 1975/08/05 DOA: 06/04/2013 PCP: No PCP Per Patient  Assessment/Plan: #1 intractable nausea and vomiting Questionable etiology. May be secondary to gastroparesis. Acute abdominal series negative for any obstruction.  Patient states nausea vomiting and abdominal pain improved. Will d/c scheduled Zofran. Continue IV Reglan, IV fluids, anti-emetics, supportive care. Will start clears. Follow.   #2 hypertension Continue IV Lopressor. Hydralazine as needed. Pain management.  #3 history of gastroparesis Continue IV Reglan.  #4 poorly-controlled insulin-dependent diabetes mellitus Hemoglobin A1c 9.0  05/31/2013. CBG 126-141. Continue Lantus and sliding scale insulin.  #5 dehydration IV fluids.  #6 hypokalemia Repleted.  #7 prophylaxis PPI for GI prophylaxis. Lovenox for DVT prophylaxis.   Code Status: Full Family Communication: Updated patient no family at bedside. Disposition Plan: Home when medically stable.   Consultants:  None  Procedures:  Acute abdominal series 06/04/2013  Antibiotics:  None  HPI/Subjective: Patient states nausea and emesis improved. Patient denies any abdominal pain.  Objective: Filed Vitals:   06/06/13 0542  BP: 135/79  Pulse: 92  Temp: 98 F (36.7 C)  Resp: 18    Intake/Output Summary (Last 24 hours) at 06/06/13 1403 Last data filed at 06/06/13 0600  Gross per 24 hour  Intake   1625 ml  Output    400 ml  Net   1225 ml   Filed Weights   06/04/13 1700 06/05/13 0548 06/06/13 0500  Weight: 131.09 kg (289 lb) 128.7 kg (283 lb 11.7 oz) 131.04 kg (288 lb 14.3 oz)    Exam:   General:  NAD  Cardiovascular: RRR  Respiratory: CTAB  Abdomen: Soft, NTTP, positive bowel sounds, nondistended.  Musculoskeletal: No clubbing cyanosis or edema  Data Reviewed: Basic Metabolic Panel:  Recent Labs Lab 05/31/13 1104 06/01/13 0433 06/04/13 1223 06/05/13 0438  06/06/13 0616  NA 144 143 140 139 138  K 4.6 4.2 3.6* 4.0 4.6  CL 103 107 101 101 104  CO2 26 26 24 24 24   GLUCOSE 197* 203* 281* 180* 161*  BUN 14 14 9 7 10   CREATININE 1.41* 1.28 1.12 1.02 1.21  CALCIUM 9.4 8.6 8.9 8.9 8.9  MG  --   --  1.7  --  2.2  PHOS  --   --  2.5  --   --    Liver Function Tests:  Recent Labs Lab 05/30/13 1823 06/04/13 1223  AST 15 12  ALT 20 17  ALKPHOS 98 96  BILITOT 0.4 0.4  PROT 7.3 7.2  ALBUMIN 4.1 4.0    Recent Labs Lab 05/30/13 1823 06/04/13 1235  LIPASE 15 25   No results found for this basename: AMMONIA,  in the last 168 hours CBC:  Recent Labs Lab 05/30/13 1823 05/31/13 1104 06/01/13 0433 06/04/13 1223 06/05/13 0438 06/06/13 0616  WBC 13.2* 10.6* 9.0 7.0 9.1 7.8  NEUTROABS 11.3*  --   --  5.6  --   --   HGB 13.9 13.9 11.9* 12.1* 12.3* 12.5*  HCT 40.4 41.2 36.2* 36.0* 36.0* 37.9*  MCV 80.5 80.9 82.5 78.3 78.8 81.2  PLT 282 257 230 207 229 209   Cardiac Enzymes: No results found for this basename: CKTOTAL, CKMB, CKMBINDEX, TROPONINI,  in the last 168 hours BNP (last 3 results) No results found for this basename: PROBNP,  in the last 8760 hours CBG:  Recent Labs Lab 06/05/13 1209 06/05/13 1730 06/05/13 1958 06/06/13 0745 06/06/13 1058  GLUCAP 161* 154* 115* 141* 126*  No results found for this or any previous visit (from the past 240 hour(s)).   Studies: Dg Abd Acute W/chest  06/04/2013   CLINICAL DATA:  Abdominal pain and vomiting.  EXAM: ACUTE ABDOMEN SERIES (ABDOMEN 2 VIEW & CHEST 1 VIEW)  COMPARISON:  05/30/2013  FINDINGS: The lungs appear clear.  Cardiac and mediastinal contours normal.  No pleural effusion identified.  Cholecystectomy clip noted. Formed stool noted in the colon. No dilated bowel. Bowel gas pattern is unremarkable.  IMPRESSION: 1. No significant radiographic abnormality.   Electronically Signed   By: Sherryl Barters M.D.   On: 06/04/2013 16:00    Scheduled Meds: . docusate sodium  100  mg Oral BID  . enoxaparin (LOVENOX) injection  65 mg Subcutaneous Q24H  . insulin aspart  0-20 Units Subcutaneous TID WC  . insulin glargine  15 Units Subcutaneous QHS  . metoCLOPramide (REGLAN) injection  10 mg Intravenous 4 times per day  . metoprolol  7.5 mg Intravenous 3 times per day  . pantoprazole (PROTONIX) IV  40 mg Intravenous Q12H   Continuous Infusions: . sodium chloride 0.9 % 1,000 mL with potassium chloride 40 mEq infusion 125 mL/hr at 06/06/13 0600    Principal Problem:   Intractable nausea and vomiting Active Problems:   Gastroparesis   IDDM (insulin dependent diabetes mellitus)   HTN (hypertension)   Dehydration   Hypokalemia    Time spent: [place] MD [facility] Pager [phone]. If 7PM-7AM, please contact night-coverage at [email or web address], password [facility] 06/06/2013, 2:03 PM  LOS: 2 days

## 2013-06-07 ENCOUNTER — Encounter (HOSPITAL_COMMUNITY): Payer: Self-pay | Admitting: Radiology

## 2013-06-07 ENCOUNTER — Inpatient Hospital Stay (HOSPITAL_COMMUNITY): Payer: BC Managed Care – PPO

## 2013-06-07 DIAGNOSIS — K529 Noninfective gastroenteritis and colitis, unspecified: Secondary | ICD-10-CM | POA: Diagnosis present

## 2013-06-07 LAB — BASIC METABOLIC PANEL
BUN: 8 mg/dL (ref 6–23)
CO2: 24 mEq/L (ref 19–32)
Calcium: 9 mg/dL (ref 8.4–10.5)
Chloride: 102 mEq/L (ref 96–112)
Creatinine, Ser: 1.21 mg/dL (ref 0.50–1.35)
GFR calc Af Amer: 88 mL/min — ABNORMAL LOW (ref >90)
GFR, EST NON AFRICAN AMERICAN: 76 mL/min — AB (ref >90)
Glucose, Bld: 146 mg/dL — ABNORMAL HIGH (ref 70–99)
POTASSIUM: 4.1 meq/L (ref 3.7–5.3)
SODIUM: 139 meq/L (ref 137–147)

## 2013-06-07 LAB — GLUCOSE, CAPILLARY
GLUCOSE-CAPILLARY: 100 mg/dL — AB (ref 70–99)
GLUCOSE-CAPILLARY: 138 mg/dL — AB (ref 70–99)
GLUCOSE-CAPILLARY: 167 mg/dL — AB (ref 70–99)
GLUCOSE-CAPILLARY: 177 mg/dL — AB (ref 70–99)
Glucose-Capillary: 104 mg/dL — ABNORMAL HIGH (ref 70–99)
Glucose-Capillary: 131 mg/dL — ABNORMAL HIGH (ref 70–99)
Glucose-Capillary: 135 mg/dL — ABNORMAL HIGH (ref 70–99)

## 2013-06-07 MED ORDER — AMLODIPINE BESYLATE 10 MG PO TABS
10.0000 mg | ORAL_TABLET | Freq: Every day | ORAL | Status: DC
  Filled 2013-06-07: qty 1

## 2013-06-07 MED ORDER — IOHEXOL 300 MG/ML  SOLN
100.0000 mL | Freq: Once | INTRAMUSCULAR | Status: AC | PRN
  Administered 2013-06-07: 100 mL via INTRAVENOUS

## 2013-06-07 MED ORDER — ONDANSETRON HCL 4 MG/2ML IJ SOLN
8.0000 mg | Freq: Three times a day (TID) | INTRAMUSCULAR | Status: DC
  Administered 2013-06-07: 8 mg via INTRAVENOUS
  Filled 2013-06-07: qty 4

## 2013-06-07 MED ORDER — METRONIDAZOLE IN NACL 5-0.79 MG/ML-% IV SOLN
500.0000 mg | Freq: Three times a day (TID) | INTRAVENOUS | Status: DC
Start: 2013-06-07 — End: 2013-06-10
  Administered 2013-06-07 – 2013-06-10 (×8): 500 mg via INTRAVENOUS
  Filled 2013-06-07 (×11): qty 100

## 2013-06-07 MED ORDER — METOCLOPRAMIDE HCL 10 MG PO TABS: 10.0000 mg | ORAL_TABLET | Freq: Three times a day (TID) | ORAL | Status: DC

## 2013-06-07 MED ORDER — METOPROLOL SUCCINATE ER 50 MG PO TB24
50.0000 mg | ORAL_TABLET | Freq: Every day | ORAL | Status: DC
  Filled 2013-06-07: qty 1

## 2013-06-07 MED ORDER — SODIUM CHLORIDE 0.9 % IJ SOLN
10.0000 mL | INTRAMUSCULAR | Status: DC | PRN
  Administered 2013-06-08 – 2013-06-11 (×5): 10 mL

## 2013-06-07 MED ORDER — METOPROLOL TARTRATE 1 MG/ML IV SOLN
10.0000 mg | Freq: Three times a day (TID) | INTRAVENOUS | Status: DC
  Administered 2013-06-07 – 2013-06-08 (×4): 10 mg via INTRAVENOUS
  Administered 2013-06-08: 7.5 mg via INTRAVENOUS
  Filled 2013-06-07 (×7): qty 10

## 2013-06-07 MED ORDER — ONDANSETRON HCL 4 MG/2ML IJ SOLN: 8.0000 mg | Freq: Three times a day (TID) | INTRAMUSCULAR | Status: DC

## 2013-06-07 MED ORDER — ONDANSETRON 8 MG/NS 50 ML IVPB
8.0000 mg | Freq: Three times a day (TID) | INTRAVENOUS | Status: DC
Start: 2013-06-07 — End: 2013-06-07
  Administered 2013-06-07: 8 mg via INTRAVENOUS
  Filled 2013-06-07 (×2): qty 8

## 2013-06-07 MED ORDER — CLONIDINE HCL 0.1 MG PO TABS
0.1000 mg | ORAL_TABLET | Freq: Two times a day (BID) | ORAL | Status: DC
  Filled 2013-06-07 (×2): qty 1

## 2013-06-07 MED ORDER — IOHEXOL 300 MG/ML  SOLN
50.0000 mL | Freq: Once | INTRAMUSCULAR | Status: AC | PRN
  Administered 2013-06-07: 50 mL via ORAL

## 2013-06-07 MED ORDER — PROMETHAZINE HCL 25 MG/ML IJ SOLN
25.0000 mg | Freq: Once | INTRAMUSCULAR | Status: AC
  Administered 2013-06-07: 25 mg via INTRAMUSCULAR
  Filled 2013-06-07: qty 1

## 2013-06-07 MED ORDER — METOCLOPRAMIDE HCL 5 MG/ML IJ SOLN
10.0000 mg | Freq: Four times a day (QID) | INTRAMUSCULAR | Status: DC
  Administered 2013-06-07 – 2013-06-12 (×21): 10 mg via INTRAVENOUS
  Filled 2013-06-07 (×27): qty 2

## 2013-06-07 MED ORDER — CIPROFLOXACIN IN D5W 400 MG/200ML IV SOLN
400.0000 mg | Freq: Two times a day (BID) | INTRAVENOUS | Status: DC
  Administered 2013-06-07 – 2013-06-10 (×6): 400 mg via INTRAVENOUS
  Filled 2013-06-07 (×7): qty 200

## 2013-06-07 MED ORDER — PROCHLORPERAZINE EDISYLATE 5 MG/ML IJ SOLN
10.0000 mg | Freq: Once | INTRAMUSCULAR | Status: AC
  Administered 2013-06-07: 10 mg via INTRAMUSCULAR

## 2013-06-07 MED ORDER — SODIUM CHLORIDE 0.9 % IV SOLN
INTRAVENOUS | Status: DC
  Administered 2013-06-07: 11:00:00 via INTRAVENOUS

## 2013-06-07 MED ORDER — HYDROMORPHONE HCL PF 1 MG/ML IJ SOLN
1.0000 mg | Freq: Once | INTRAMUSCULAR | Status: AC
  Administered 2013-06-07: 1 mg via INTRAMUSCULAR
  Filled 2013-06-07: qty 1

## 2013-06-07 NOTE — Progress Notes (Signed)
Pt nauseated and vomiting.  Unable to do PICC at this time.  Attempted to place PIV with ultrasound unsuccessfully

## 2013-06-07 NOTE — Progress Notes (Signed)
IV infiltrated in left AC.  IV removed.  IV team called to restart IV.

## 2013-06-07 NOTE — Progress Notes (Signed)
[facility] PROGRESS NOTE  Duane [name] [id number] DOB: [birth year] DOA: 06/04/2013 PCP: No PCP Per Patient  Assessment/Plan: #1 intractable nausea and vomiting Questionable etiology. May be secondary to gastroparesis. Acute abdominal series negative for any obstruction.  Patient after discontinuation of scheduled Zofran and started on clear liquids patient went intractable nausea, vomiting and upper abdominal pain. Will keep patient n.p.o. Will resume scheduled Zofran. Continue IV Reglan, IV fluids, anti-emetics, supportive care. Will check a CT of the abdomen and pelvis. Consult with GI for further evaluation and management.   #2 hypertension Continue IV Lopressor. Hydralazine as needed. Pain management.  #3 history of gastroparesis Continue IV Reglan.  #4 poorly-controlled insulin-dependent diabetes mellitus Hemoglobin A1c 9.0  05/31/2013. CBG 138-167. Continue Lantus and sliding scale insulin.  #5 dehydration IV fluids.  #6 hypokalemia Repleted.  #7 prophylaxis PPI for GI prophylaxis. Lovenox for DVT prophylaxis.   Code Status: Full Family Communication: Updated patient no family at bedside. Disposition Plan: Home when medically stable.   Consultants:  None  Procedures:  Acute abdominal series 06/04/2013, 06/07/2013  Antibiotics:  None  HPI/Subjective: Patient with nausea and emesis overnight and early this morning.  Patient with complaints of upper abdominal pain.  Objective: Filed Vitals:   06/07/13 0619  BP: 201/111  Pulse: 97  Temp: 98.5 F (36.9 C)  Resp: 18    Intake/Output Summary (Last 24 hours) at 06/07/13 1222 Last data filed at 06/07/13 0730  Gross per 24 hour  Intake      0 ml  Output    150 ml  Net   -150 ml   Filed Weights   06/04/13 1700 06/05/13 0548 06/06/13 0500  Weight: 131.09 kg (289 lb) 128.7 kg (283 lb 11.7 oz) 131.04 kg (288 lb 14.3 oz)    Exam:   General:  NAD  Cardiovascular: RRR  Respiratory:  CTAB  Abdomen: Soft, upper abdomen with some TTP, positive bowel sounds, nondistended.  Musculoskeletal: No clubbing cyanosis or edema  Data Reviewed: Basic Metabolic Panel:  Recent Labs Lab 06/01/13 0433 06/04/13 1223 06/05/13 0438 06/06/13 0616 06/07/13 0600  NA 143 140 139 138 139  K 4.2 3.6* 4.0 4.6 4.1  CL 107 101 101 104 102  CO2 26 24 24 24 24   GLUCOSE 203* 281* 180* 161* 146*  BUN 14 9 7 10 8   CREATININE 1.28 1.12 1.02 1.21 1.21  CALCIUM 8.6 8.9 8.9 8.9 9.0  MG  --  1.7  --  2.2  --   PHOS  --  2.5  --   --   --    Liver Function Tests:  Recent Labs Lab 06/04/13 1223  AST 12  ALT 17  ALKPHOS 96  BILITOT 0.4  PROT 7.2  ALBUMIN 4.0    Recent Labs Lab 06/04/13 1235  LIPASE 25   No results found for this basename: AMMONIA,  in the last 168 hours CBC:  Recent Labs Lab 06/01/13 0433 06/04/13 1223 06/05/13 0438 06/06/13 0616  WBC 9.0 7.0 9.1 7.8  NEUTROABS  --  5.6  --   --   HGB 11.9* 12.1* 12.3* 12.5*  HCT 36.2* 36.0* 36.0* 37.9*  MCV 82.5 78.3 78.8 81.2  PLT 230 207 229 209   Cardiac Enzymes: No results found for this basename: CKTOTAL, CKMB, CKMBINDEX, TROPONINI,  in the last 168 hours BNP (last 3 results) No results found for this basename: PROBNP,  in the last 8760 hours CBG:  Recent Labs Lab 06/06/13 2033 06/07/13 0005 06/07/13  [id number] 06/07/13 0739 06/07/13 1141  GLUCAP 169* 135* 138* 167* 177*    No results found for this or any previous visit (from the past 240 hour(s)).   Studies: Dg Abd Acute W/chest  06/07/2013   CLINICAL DATA:  Nausea and vomiting.  Epigastric pain.  EXAM: ACUTE ABDOMEN SERIES (ABDOMEN 2 VIEW & CHEST 1 VIEW)  COMPARISON:  06/04/2013  FINDINGS: The cardiomediastinal silhouette is within normal limits. The lungs are well inflated and clear. There is no evidence of pleural effusion or pneumothorax. No acute osseous abnormality is identified.  There is no evidence of intraperitoneal free air. No air-fluid levels  are seen. There is a moderate amount of stool in the colon, similar to prior. No dilated loops of bowel are seen. Right upper quadrant surgical clips are again noted.  IMPRESSION: Negative abdominal radiographs.  No acute cardiopulmonary disease.   Electronically Signed   By: Logan Bores   On: 06/07/2013 11:48    Scheduled Meds: . docusate sodium  100 mg Oral BID  . enoxaparin (LOVENOX) injection  65 mg Subcutaneous Q24H  . insulin aspart  0-20 Units Subcutaneous TID WC  . insulin glargine  15 Units Subcutaneous QHS  . metoCLOPramide (REGLAN) injection  10 mg Intravenous 4 times per day  . metoprolol  10 mg Intravenous 3 times per day  . ondansetron  8 mg Intravenous 3 times per day  . pantoprazole (PROTONIX) IV  40 mg Intravenous Q12H   Continuous Infusions: . sodium chloride 0.9 % 1,000 mL with potassium chloride 40 mEq infusion 125 mL/hr at 06/06/13 1800    Principal Problem:   Intractable nausea and vomiting Active Problems:   Gastroparesis   IDDM (insulin dependent diabetes mellitus)   HTN (hypertension)   Dehydration   Hypokalemia    Time spent: [place] MD [facility] Pager [phone]. If 7PM-7AM, please contact night-coverage at [email or web address], password [facility] 06/07/2013, 12:22 PM  LOS: 3 days

## 2013-06-07 NOTE — Consult Note (Signed)
Referring Provider: No ref. provider found Primary Care Physician:  No PCP Per Patient Primary Gastroenterologist:  None, unassigned  Reason for Consultation:  Intractable vomiting; history of gastroparesis  HPI: Duane [name] is a 38 y.o. male with history of diabetes mellitus hemoglobin A1c of 9.0 05/31/2013, gastroparesis per patient diagnosed in [place] in 2013, gastritis, hypertension who was recently hospitalized on 05/30/2013 to 06/02/2013 for complaints of nausea and vomiting felt to be secondary to gastroparesis.  He was only home over 24 hours before returning to the ED on 4/23 with several hour history of nausea and intractable emesis since approximately 8 AM on the morning of admission. He was admitted again for further management.  We do not have any previous GI records as patient just moved to this area approximately 2 weeks ago.  He reports that he had an EGD when his gastroparesis was diagnosed and says that it only found gastritis.  Denies ever having a GES.  Patient does endorse some upper abdominal pain as well.  During my visit with him today he was very sleepy and difficult to obtain information from.  He was apparently receiving zofran 8 mg every 8 hours and was improving but then it was discontinued and he began having a lot of vomiting again this AM, but he says that the zofran usually does not help him.  Vomiting liquid only as he has not eaten.  No blood.  He has also been receiving pantoprazole 40 mg IV BID and reglan 10 mg IV four times per day.  He is convinced that these symptoms are from his gastroparesis and that he's had the exact same type of episodes in the past.  He is also receiving phenergan 25 mg IV every 6 hours as needed as well as compazine 10 mg every 6 hours as needed.  Abdominal x-rays have been negative, but due to continued nausea and vomiting a CT scan of the abdomen and pelvis has been ordered, with IV contrast only, to be sure that nothing else is being  missed.  Labs have been unremarkable including normal lipase.   Past Medical History  Diagnosis Date  . Hypertension   . Diabetes mellitus without complication   . Gastritis   . Gastroparesis     Past Surgical History  Procedure Laterality Date  . Cholecystectomy    . Knee surgery Left     Prior to Admission medications   Medication Sig Start Date End Date Taking? Authorizing Provider  acetaminophen (TYLENOL) 500 MG tablet Take 500 mg by mouth every 6 (six) hours as needed for mild pain.    Yes Historical Provider, MD  amLODipine (NORVASC) 10 MG tablet Take 1 tablet (10 mg total) by mouth daily. 06/02/13  Yes Robbie Lis, MD  cloNIDine (CATAPRES) 0.1 MG tablet Take 0.1 mg by mouth 2 (two) times daily.   Yes Historical Provider, MD  gabapentin (NEURONTIN) 300 MG capsule Take 600 mg by mouth at bedtime.   Yes Historical Provider, MD  HYDROcodone-acetaminophen (NORCO/VICODIN) 5-325 MG per tablet Take 1-2 tablets by mouth every 4 (four) hours as needed for moderate pain. 06/02/13  Yes Robbie Lis, MD  insulin aspart (NOVOLOG) 100 UNIT/ML injection Inject 9-12 Units into the skin 3 (three) times daily before meals.   Yes Historical Provider, MD  insulin glargine (LANTUS) 100 UNIT/ML injection Inject 32 Units into the skin at bedtime.   Yes Historical Provider, MD  metoCLOPramide (REGLAN) 10 MG tablet Take 10 mg by mouth 4 (  four) times daily.   Yes Historical Provider, MD  metoprolol succinate (TOPROL-XL) 50 MG 24 hr tablet Take 50 mg by mouth daily. Take with or immediately following a meal.   Yes Historical Provider, MD  pantoprazole (PROTONIX) 40 MG tablet Take 40 mg by mouth daily.   Yes Historical Provider, MD  sucralfate (CARAFATE) 1 G tablet Take 1 g by mouth 4 (four) times daily -  before meals and at bedtime.   Yes Historical Provider, MD    Current Facility-Administered Medications  Medication Dose Route Frequency Provider Last Rate Last Dose  . 0.9 %  sodium chloride infusion    Intravenous Continuous Eugenie Filler, MD      . acetaminophen (TYLENOL) tablet 650 mg  650 mg Oral Q6H PRN Eugenie Filler, MD       Or  . acetaminophen (TYLENOL) suppository 650 mg  650 mg Rectal Q6H PRN Eugenie Filler, MD      . bisacodyl (DULCOLAX) suppository 10 mg  10 mg Rectal Daily PRN Eugenie Filler, MD      . docusate sodium (COLACE) capsule 100 mg  100 mg Oral BID Eugenie Filler, MD   100 mg at 06/06/13 0957  . enoxaparin (LOVENOX) injection 65 mg  65 mg Subcutaneous Q24H Darnell Level Mancheril, RPH   65 mg at 06/06/13 2102  . hydrALAZINE (APRESOLINE) injection 10 mg  10 mg Intravenous Q6H PRN Eugenie Filler, MD   10 mg at 06/04/13 1907  . HYDROmorphone (DILAUDID) injection 1-2 mg  1-2 mg Intravenous Q3H PRN Eugenie Filler, MD   2 mg at 06/06/13 2113  . insulin aspart (novoLOG) injection 0-20 Units  0-20 Units Subcutaneous TID WC Eugenie Filler, MD   4 Units at 06/07/13 [phone]  . insulin glargine (LANTUS) injection 15 Units  15 Units Subcutaneous QHS Eugenie Filler, MD   15 Units at 06/06/13 2118  . iohexol (OMNIPAQUE) 300 MG/ML solution 50 mL  50 mL Oral Once PRN Medication Radiologist, MD      . metoCLOPramide (REGLAN) injection 10 mg  10 mg Intravenous 4 times per day Eugenie Filler, MD      . metoprolol (LOPRESSOR) injection 10 mg  10 mg Intravenous 3 times per day Eugenie Filler, MD      . pantoprazole (PROTONIX) injection 40 mg  40 mg Intravenous Q12H Eugenie Filler, MD   40 mg at 06/06/13 2103  . prochlorperazine (COMPAZINE) injection 10 mg  10 mg Intravenous Q6H PRN Eugenie Filler, MD   10 mg at 06/06/13 2113  . promethazine (PHENERGAN) injection 25 mg  25 mg Intravenous Q6H PRN Eugenie Filler, MD   25 mg at 06/06/13 [id number]  . sodium chloride 0.9 % 1,000 mL with potassium chloride 40 mEq infusion   Intravenous Continuous Eugenie Filler, MD 125 mL/hr at 06/06/13 1800      Allergies as of 06/04/2013  . (No Known Allergies)    History  reviewed. No pertinent family history.  History   Social History  . Marital Status: Single    Spouse Name: N/A    Number of Children: N/A  . Years of Education: N/A   Occupational History  . Not on file.   Social History Main Topics  . Smoking status: Never Smoker   . Smokeless tobacco: Never Used  . Alcohol Use: Yes  . Drug Use: No  . Sexual Activity: Yes   Other Topics Concern  .  Not on file   Social History Narrative  . No narrative on file    Review of Systems: Ten point ROS is O/W negative except as mentioned in HPI.  Physical Exam: Vital signs in last 24 hours: Temp:  [98.2 F (36.8 C)-98.5 F (36.9 C)] 98.5 F (36.9 C) (04/26 0619) Pulse Rate:  [79-97] 97 (04/26 0619) Resp:  [18] 18 (04/26 0619) BP: (142-201)/(87-111) 201/111 mmHg (04/26 0619) SpO2:  [94 %-98 %] 97 % (04/26 0619) Last BM Date: 06/04/13 General:  Alert, but sleepy and difficult to obtain information.  Well-developed, well-nourished in NAD Head:  Normocephalic and atraumatic. Eyes:  Sclera clear, no icterus.  Conjunctiva pink. Ears:  Normal auditory acuity. Mouth:  No deformity or lesions.   Lungs:  Clear throughout to auscultation.  No wheezes, crackles, or rhonchi.  Heart:  Regular rate and rhythm; no murmurs, clicks, rubs, or gallops. Abdomen:  Soft, non-distended.  BS present.  Mild upper abdominal TTP but abdomen benign.   Rectal:  Deferred  Msk:  Symmetrical without gross deformities. Pulses:  Normal pulses noted. Extremities:  Without clubbing or edema. Neurologic:  Alert and  oriented x4;  grossly normal neurologically. Skin:  Intact without significant lesions or rashes. Psych:  Alert and cooperative. Normal mood and affect.  Intake/Output this shift: Total I/O In: -  Out: 150 [Emesis/NG output:150]  Lab Results:  Recent Labs  06/04/13 1223 06/05/13 0438 06/06/13 0616  WBC 7.0 9.1 7.8  HGB 12.1* 12.3* 12.5*  HCT 36.0* 36.0* 37.9*  PLT 207 229 209   BMET  Recent  Labs  06/05/13 0438 06/06/13 0616 06/07/13 0600  NA 139 138 139  K 4.0 4.6 4.1  CL 101 104 102  CO2 24 24 24   GLUCOSE 180* 161* 146*  BUN 7 10 8   CREATININE 1.02 1.21 1.21  CALCIUM 8.9 8.9 9.0   LFT  Recent Labs  06/04/13 1223  PROT 7.2  ALBUMIN 4.0  AST 12  ALT 17  ALKPHOS 96  BILITOT 0.4   IMPRESSION:  -Intractable nausea and vomiting:  Likely secondary to gastroparesis. -Reported diagnosis of gastroparesis -Long-standing, 10 year history, of uncontrolled diabetes mellitus with Hgb A1C of 9.0 recently  PLAN: -Follow-up CT scan. -Plan per Dr. Henrene Pastor.  ? Need for EGD vs GES at some point when he can tolerate that. -For now would continue NPO, zofran 8 mg every 8 hours, pantoprazole 40 mg IV BID, Reglan 10 mg IV every 6 hours as well as phenergan 25 mg IV every 6 hours prn and compazine 10 mg every 6 hours as needed. -Need to limit narcotics as this will worsen gastroparesis. -Needs better blood sugar control.   Laban Emperor. Zehr  06/07/2013, 10:31 AM  Pager number [phone]  GI ATTENDING  History, laboratories, x-rays reviewed. Patient personally seen and examined. Agree with above as outlined. Long-standing insulin requiring diabetic with suboptimal control presents with recurrent nausea and vomiting. Has been on PPI and Carafate as outpatient. EGD elsewhere showed "inflammation". He denies odynophagia or dysphagia. Seemed to do better with running Zofran. Has CT already scheduled. Agree with plans to increase PPI, running Zofran and Reglan, and better diabetic control. Relook EGD may be of some benefit. As well, consider gastric emptying scan (but he would not be able to complete this currently). GI will follow. Thank you  Docia Chuck. Geri Seminole., M.D. [facility] Division of Gastroenterology

## 2013-06-07 NOTE — ED Provider Notes (Signed)
Medical screening examination/treatment/procedure(s) were conducted as a shared visit with non-physician practitioner(s) and myself.  I personally evaluated the patient during the encounter.   EKG Interpretation None      I interviewed and examined the patient. Lungs are CTAB. Cardiac exam wnl. Abdomen soft w/ mild epig ttp. Sx c/w gastroparesis. Mild improvement in ED. Will admit.   Blanchard Kelch, MD 06/07/13 1034

## 2013-06-07 NOTE — Progress Notes (Signed)
Peripherally Inserted Central Catheter/Midline Placement  The IV Nurse has discussed with the patient and/or persons authorized to consent for the patient, the purpose of this procedure and the potential benefits and risks involved with this procedure.  The benefits include less needle sticks, lab draws from the catheter and patient may be discharged home with the catheter.  Risks include, but not limited to, infection, bleeding, blood clot (thrombus formation), and puncture of an artery; nerve damage and irregular heat beat.  Alternatives to this procedure were also discussed.  PICC/Midline Placement Documentation  PICC / Midline Double Lumen 0000000 PICC Right Basilic 47 cm 0 cm (Active)  Indication for Insertion or Continuance of Line Limited venous access - need for IV therapy >5 days (PICC only) 06/07/2013 11:02 AM  Exposed Catheter (cm) 0 cm 06/07/2013 11:02 AM  Site Assessment Clean;Dry;Intact 06/07/2013 11:02 AM  Lumen #1 Status Flushed;Saline locked;Blood return noted 06/07/2013 11:02 AM  Lumen #2 Status Flushed;Saline locked;Blood return noted 06/07/2013 11:02 AM  Dressing Type Transparent 06/07/2013 11:02 AM  Dressing Status Clean;Intact;Dry;Antimicrobial disc in place 06/07/2013 11:02 AM  Dressing Change Due 06/14/13 06/07/2013 11:02 AM       Rolena Infante 06/07/2013, 11:03 AM

## 2013-06-08 DIAGNOSIS — K5289 Other specified noninfective gastroenteritis and colitis: Secondary | ICD-10-CM

## 2013-06-08 LAB — GLUCOSE, CAPILLARY
GLUCOSE-CAPILLARY: 106 mg/dL — AB (ref 70–99)
GLUCOSE-CAPILLARY: 117 mg/dL — AB (ref 70–99)
GLUCOSE-CAPILLARY: 111 mg/dL — AB (ref 70–99)
Glucose-Capillary: 115 mg/dL — ABNORMAL HIGH (ref 70–99)
Glucose-Capillary: 132 mg/dL — ABNORMAL HIGH (ref 70–99)

## 2013-06-08 LAB — COMPREHENSIVE METABOLIC PANEL
ALT: 13 U/L (ref 0–53)
AST: 12 U/L (ref 0–37)
Albumin: 3.3 g/dL — ABNORMAL LOW (ref 3.5–5.2)
Alkaline Phosphatase: 70 U/L (ref 39–117)
BUN: 7 mg/dL (ref 6–23)
CHLORIDE: 99 meq/L (ref 96–112)
CO2: 26 mEq/L (ref 19–32)
CREATININE: 1.14 mg/dL (ref 0.50–1.35)
Calcium: 8.8 mg/dL (ref 8.4–10.5)
GFR calc Af Amer: >90 mL/min (ref >90)
GFR calc non Af Amer: 81 mL/min — ABNORMAL LOW (ref >90)
Glucose, Bld: 110 mg/dL — ABNORMAL HIGH (ref 70–99)
Potassium: 3.8 mEq/L (ref 3.7–5.3)
Sodium: 139 mEq/L (ref 137–147)
TOTAL PROTEIN: 6.2 g/dL (ref 6.0–8.3)
Total Bilirubin: 0.4 mg/dL (ref 0.3–1.2)

## 2013-06-08 LAB — CBC
HEMATOCRIT: 37.5 % — AB (ref 39.0–52.0)
HEMOGLOBIN: 12.3 g/dL — AB (ref 13.0–17.0)
MCH: 26.6 pg (ref 26.0–34.0)
MCHC: 32.8 g/dL (ref 30.0–36.0)
MCV: 81.2 fL (ref 78.0–100.0)
Platelets: 217 10*3/uL (ref 150–400)
RBC: 4.62 MIL/uL (ref 4.22–5.81)
RDW: 12.9 % (ref 11.5–15.5)
WBC: 7.4 10*3/uL (ref 4.0–10.5)

## 2013-06-08 MED ORDER — ONDANSETRON 8 MG/NS 50 ML IVPB
8.0000 mg | Freq: Three times a day (TID) | INTRAVENOUS | Status: DC
  Administered 2013-06-08 – 2013-06-12 (×14): 8 mg via INTRAVENOUS
  Filled 2013-06-08 (×17): qty 8

## 2013-06-08 MED ORDER — METOPROLOL TARTRATE 1 MG/ML IV SOLN
10.0000 mg | Freq: Four times a day (QID) | INTRAVENOUS | Status: DC
  Administered 2013-06-08 – 2013-06-10 (×6): 10 mg via INTRAVENOUS
  Filled 2013-06-08 (×10): qty 10

## 2013-06-08 MED ORDER — HYDRALAZINE HCL 20 MG/ML IJ SOLN
5.0000 mg | Freq: Once | INTRAMUSCULAR | Status: AC
  Administered 2013-06-08: 5 mg via INTRAVENOUS
  Filled 2013-06-08: qty 0.25

## 2013-06-08 MED ORDER — ENALAPRILAT 1.25 MG/ML IV SOLN
0.6250 mg | Freq: Four times a day (QID) | INTRAVENOUS | Status: DC
  Administered 2013-06-08 – 2013-06-10 (×9): 0.625 mg via INTRAVENOUS
  Filled 2013-06-08 (×14): qty 0.5

## 2013-06-08 NOTE — Progress Notes (Signed)
[facility] PROGRESS NOTE  Duane [name] [id number] DOB: [birth year] DOA: 06/04/2013 PCP: No PCP Per Patient  Assessment/Plan: #1 colitis Per abdominal CT. Patient presented with intractable nausea and vomiting. C. difficile PCR is pending. Patient with some clinical improvement. Continue empiric ciprofloxacin and IV Flagyl. IV fluids. Supportive care. GI is following and appreciate input and recommendations.  #2 intractable nausea and vomiting Questionable etiology. Likely multifactorial secondary to problem #1/acute colitis and gastroparesis. Acute abdominal series negative for any obstruction.  Patient after discontinuation of scheduled Zofran and started on clear liquids patient went intractable nausea, vomiting and upper abdominal pain. Patient is back on the scheduled IV Zofran and IV Reglan. CT of the abdomen and pelvis which was obtained was consistent with a transverse colon colitis. C. difficile PCR is pending. Continue empiric IV ciprofloxacin and IV Flagyl. GI is following and appreciate input and recommendations. Continue bowel rest. Continue supportive care.  #3 hypertension Continue IV Lopressor. Hydralazine as needed. Pain management.  #4 history of gastroparesis Continue IV Reglan.  #5 poorly-controlled insulin-dependent diabetes mellitus Hemoglobin A1c 9.0  05/31/2013. CBG 106-117. Continue Lantus and sliding scale insulin.  #6 dehydration IV fluids.  #7 hypokalemia Repleted.  #8 prophylaxis PPI for GI prophylaxis. Lovenox for DVT prophylaxis.   Code Status: Full Family Communication: Updated patient no family at bedside. Disposition Plan: Home when medically stable.   Consultants:  Gastroenterology: Dr. Henrene Pastor 06/07/2013  Procedures:  Acute abdominal series 06/04/2013, 06/07/2013  CT abdomen and pelvis 4/26/ 2015  Antibiotics:  None  HPI/Subjective: Patient states nausea and emesis improving. Patient states abdominal pain  improving.  Objective: Filed Vitals:   06/08/13 1501  BP: 173/114  Pulse: 92  Temp: 98.3 F (36.8 C)  Resp: 20    Intake/Output Summary (Last 24 hours) at 06/08/13 1835 Last data filed at 06/08/13 1506  Gross per 24 hour  Intake   1750 ml  Output   2100 ml  Net   -350 ml   Filed Weights   06/05/13 0548 06/06/13 0500 06/08/13 0406  Weight: 128.7 kg (283 lb 11.7 oz) 131.04 kg (288 lb 14.3 oz) 129.5 kg (285 lb 7.9 oz)    Exam:   General:  NAD  Cardiovascular: RRR  Respiratory: CTAB  Abdomen: Soft, upper abdomen with decreased TTP, positive bowel sounds, nondistended.  Musculoskeletal: No clubbing cyanosis or edema  Data Reviewed: Basic Metabolic Panel:  Recent Labs Lab 06/04/13 1223 06/05/13 0438 06/06/13 0616 06/07/13 0600 06/08/13 0326  NA 140 139 138 139 139  K 3.6* 4.0 4.6 4.1 3.8  CL 101 101 104 102 99  CO2 24 24 24 24 26   GLUCOSE 281* 180* 161* 146* 110*  BUN 9 7 10 8 7   CREATININE 1.12 1.02 1.21 1.21 1.14  CALCIUM 8.9 8.9 8.9 9.0 8.8  MG 1.7  --  2.2  --   --   PHOS 2.5  --   --   --   --    Liver Function Tests:  Recent Labs Lab 06/04/13 1223 06/08/13 0326  AST 12 12  ALT 17 13  ALKPHOS 96 70  BILITOT 0.4 0.4  PROT 7.2 6.2  ALBUMIN 4.0 3.3*    Recent Labs Lab 06/04/13 1235  LIPASE 25   No results found for this basename: AMMONIA,  in the last 168 hours CBC:  Recent Labs Lab 06/04/13 1223 06/05/13 0438 06/06/13 0616 06/08/13 0326  WBC 7.0 9.1 7.8 7.4  NEUTROABS 5.6  --   --   --  HGB 12.1* 12.3* 12.5* 12.3*  HCT 36.0* 36.0* 37.9* 37.5*  MCV 78.3 78.8 81.2 81.2  PLT 207 229 209 217   Cardiac Enzymes: No results found for this basename: CKTOTAL, CKMB, CKMBINDEX, TROPONINI,  in the last 168 hours BNP (last 3 results) No results found for this basename: PROBNP,  in the last 8760 hours CBG:  Recent Labs Lab 06/07/13 2358 06/08/13 0403 06/08/13 0803 06/08/13 1138 06/08/13 1720  GLUCAP 100* 106* 117* 111* 115*     No results found for this or any previous visit (from the past 240 hour(s)).   Studies: Ct Abdomen Pelvis W Contrast  06/07/2013   CLINICAL DATA:  Intractable nausea and vomiting. Diffuse abdominal pain.  EXAM: CT ABDOMEN AND PELVIS WITH CONTRAST  TECHNIQUE: Multidetector CT imaging of the abdomen and pelvis was performed using the standard protocol following bolus administration of intravenous contrast.  CONTRAST:  34mL OMNIPAQUE IOHEXOL 300 MG/ML SOLN, 183mL OMNIPAQUE IOHEXOL 300 MG/ML SOLN  COMPARISON:  Abdominal radiographs earlier the same day  FINDINGS: Minimal dependent atelectasis is present in the lung bases. There is no pleural effusion.  1 cm hypodensity in hepatic segment VII/VIII is too small to fully characterize. The gallbladder is surgically absent. 1 cm splenule is noted inferior to the spleen. The adrenal glands, kidneys, and pancreas have an unremarkable enhanced appearance.  The small bowel is decompressed. There is diffuse wall thickening involving the transverse colon. The more distal colon as well as the ascending colon are unremarkable, with a small amount of stool present. Appendix is identified in the right lower quadrant/ upper pelvis and is unremarkable. The SMA and SMV are grossly patent, as is the IMA.  Minimal atherosclerotic calcifications is noted at the common iliac artery bifurcations. No free fluid or enlarged lymph nodes are identified. No acute osseous abnormality is identified.  IMPRESSION: 1. Diffuse wall thickening involving the transverse colon, consistent with colitis. 2. 1 cm low-density lesion in the liver, too small to characterize.   Electronically Signed   By: Logan Bores   On: 06/07/2013 15:08   Dg Abd Acute W/chest  06/07/2013   CLINICAL DATA:  Nausea and vomiting.  Epigastric pain.  EXAM: ACUTE ABDOMEN SERIES (ABDOMEN 2 VIEW & CHEST 1 VIEW)  COMPARISON:  06/04/2013  FINDINGS: The cardiomediastinal silhouette is within normal limits. The lungs are well  inflated and clear. There is no evidence of pleural effusion or pneumothorax. No acute osseous abnormality is identified.  There is no evidence of intraperitoneal free air. No air-fluid levels are seen. There is a moderate amount of stool in the colon, similar to prior. No dilated loops of bowel are seen. Right upper quadrant surgical clips are again noted.  IMPRESSION: Negative abdominal radiographs.  No acute cardiopulmonary disease.   Electronically Signed   By: Logan Bores   On: 06/07/2013 11:48    Scheduled Meds: . ciprofloxacin  400 mg Intravenous Q12H  . docusate sodium  100 mg Oral BID  . enalaprilat  0.625 mg Intravenous 4 times per day  . enoxaparin (LOVENOX) injection  65 mg Subcutaneous Q24H  . insulin aspart  0-20 Units Subcutaneous TID WC  . insulin glargine  15 Units Subcutaneous QHS  . metoCLOPramide (REGLAN) injection  10 mg Intravenous 4 times per day  . metoprolol  10 mg Intravenous 3 times per day  . metronidazole  500 mg Intravenous Q8H  . ondansetron (ZOFRAN) IV  8 mg Intravenous 3 times per day  . pantoprazole (PROTONIX)  IV  40 mg Intravenous Q12H   Continuous Infusions: . sodium chloride 0.9 % 1,000 mL with potassium chloride 40 mEq infusion 125 mL/hr at 06/08/13 1740    Principal Problem:   Colitis Active Problems:   Gastroparesis   IDDM (insulin dependent diabetes mellitus)   HTN (hypertension)   Intractable nausea and vomiting   Dehydration   Hypokalemia    Time spent: [place] MD [facility] Pager [phone]. If 7PM-7AM, please contact night-coverage at [email or web address], password [facility] 06/08/2013, 6:35 PM  LOS: 4 days

## 2013-06-08 NOTE — Care Management Note (Unsigned)
    Page 1 of 1   06/08/2013     3:18:21 PM CARE MANAGEMENT NOTE 06/08/2013  Patient:  Duane [name], Duane [name]   Account Number:  [id number]  Date Initiated:  06/05/2013  Documentation initiated by:  [facility]  Subjective/Objective Assessment:   38 year old diabetic admitted with nausea, vomiting and abdominal pai.     Action/Plan:   From home. Needs PCP.   Anticipated DC Date:  06/11/2013   Anticipated DC Plan:  [place]  CM consult      Choice offered to / List presented to:             Status of service:   Medicare Important Message given?   (If response is "NO", the following Medicare IM given date fields will be blank) Date Medicare IM given:   Date Additional Medicare IM given:    Discharge Disposition:    Per UR Regulation:    If discussed at Long Length of Stay Meetings, dates discussed:    Comments:  06/08/13 Allene Dillon RN BSN [phone] Met with pt to discuss follow up care after d/c from hospital. He stated he recently moved from [place] and has not established care with any healthcare provider here yet. I advised him to call his customer service # for his insurance to see who is in-network. He was agreeable to this. I aslo talked to him about the Community and [employer] which he stated he has information about this form his previous  hospitalization. He also informed me he is able to afford his medications.   06/05/13 Raju Coppolino RN BSN Stopped by to see pt about needing a PCP. Pt resting and requesting I came back later.

## 2013-06-08 NOTE — Progress Notes (Signed)
    Progress Note   Subjective  Still requiring anti-emetics.    Objective   Vital signs in last 24 hours: Temp:  [97.8 F (36.6 C)-98.4 F (36.9 C)] 98.2 F (36.8 C) (04/27 0654) Pulse Rate:  [80-96] 91 (04/27 0654) Resp:  [18] 18 (04/27 0654) BP: (153-178)/(88-112) 178/97 mmHg (04/27 0654) SpO2:  [94 %-96 %] 94 % (04/27 0654) Weight:  [285 lb 7.9 oz (129.5 kg)] 285 lb 7.9 oz (129.5 kg) (04/27 0406) Last BM Date: 06/04/13 General:    Black male in NAD Heart:  Regular rate and rhythm Abdomen:  Soft, nontender and nondistended. Normal bowel sounds. Extremities:  Without edema. Neurologic:  Alert and oriented,  grossly normal neurologically. Psych:  Cooperative. Normal mood and affect.  Lab Results BMET  Recent Labs  06/06/13 0616 06/07/13 0600 06/08/13 0326  NA 138 139 139  K 4.6 4.1 3.8  CL 104 102 99  CO2 24 24 26   GLUCOSE 161* 146* 110*  BUN 10 8 7   CREATININE 1.21 1.21 1.14  CALCIUM 8.9 9.0 8.8   LFT  Recent Labs  06/08/13 0326  PROT 6.2  ALBUMIN 3.3*  AST 12  ALT 13  ALKPHOS 70  BILITOT 0.4      Assessment / Plan:    97. 38 year old male with nausea and vomiting for which he was just hospitalized here from 06/09/13 to 06/02/13.  He has a history of gastroparesis (2013 dx when in [place]). Nausea and vomiting may be secondary to gastroparesis but he has possible colitis by CTscan which could also be contributing.  Continue scheduled Zofran and Reglan. Blood sugars are well controlled at present. Hopefully trial of clears soon   2. ?Colitis. Diffuse bowel wall thickening of transverse colon on CTscan. He reports several weeks of intermittent loose preceding last hospital admission a few days ago. Stools have been more on normal side since then. C-diff pending. Cipro / Flagyl was started yesterday.      LOS: 4 days   Willia Craze  06/08/2013, 8:04 AM   [facility] Attending  I have also seen and assessed the patient and agree with the above  note. He is stable - not worse, not better. Transverse colon definitely abnormal - ? Why. Await stool studies. Says only Abx was erythromycin during/after last admit.  [name], MD, [facility] [phone] (pager) 06/08/2013 7:14 PM '

## 2013-06-09 DIAGNOSIS — R933 Abnormal findings on diagnostic imaging of other parts of digestive tract: Secondary | ICD-10-CM

## 2013-06-09 LAB — CBC
HCT: 37.7 % — ABNORMAL LOW (ref 39.0–52.0)
Hemoglobin: 12.8 g/dL — ABNORMAL LOW (ref 13.0–17.0)
MCH: 27.5 pg (ref 26.0–34.0)
MCHC: 34 g/dL (ref 30.0–36.0)
MCV: 81.1 fL (ref 78.0–100.0)
PLATELETS: 217 10*3/uL (ref 150–400)
RBC: 4.65 MIL/uL (ref 4.22–5.81)
RDW: 13 % (ref 11.5–15.5)
WBC: 6.8 10*3/uL (ref 4.0–10.5)

## 2013-06-09 LAB — GLUCOSE, CAPILLARY
GLUCOSE-CAPILLARY: 132 mg/dL — AB (ref 70–99)
GLUCOSE-CAPILLARY: 159 mg/dL — AB (ref 70–99)
Glucose-Capillary: 130 mg/dL — ABNORMAL HIGH (ref 70–99)
Glucose-Capillary: 140 mg/dL — ABNORMAL HIGH (ref 70–99)
Glucose-Capillary: 150 mg/dL — ABNORMAL HIGH (ref 70–99)
Glucose-Capillary: 165 mg/dL — ABNORMAL HIGH (ref 70–99)

## 2013-06-09 LAB — BASIC METABOLIC PANEL
BUN: 8 mg/dL (ref 6–23)
CALCIUM: 8.7 mg/dL (ref 8.4–10.5)
CO2: 24 mEq/L (ref 19–32)
Chloride: 96 mEq/L (ref 96–112)
Creatinine, Ser: 1.08 mg/dL (ref 0.50–1.35)
GFR calc Af Amer: >90 mL/min (ref >90)
GFR calc non Af Amer: 87 mL/min — ABNORMAL LOW (ref >90)
GLUCOSE: 163 mg/dL — AB (ref 70–99)
Potassium: 4.3 mEq/L (ref 3.7–5.3)
Sodium: 136 mEq/L — ABNORMAL LOW (ref 137–147)

## 2013-06-09 MED ORDER — KETOROLAC TROMETHAMINE 30 MG/ML IJ SOLN
30.0000 mg | Freq: Four times a day (QID) | INTRAMUSCULAR | Status: AC
  Administered 2013-06-09 – 2013-06-11 (×8): 30 mg via INTRAVENOUS
  Filled 2013-06-09 (×2): qty 2
  Filled 2013-06-09 (×3): qty 1
  Filled 2013-06-09: qty 2
  Filled 2013-06-09 (×5): qty 1

## 2013-06-09 MED ORDER — HYDROMORPHONE HCL PF 1 MG/ML IJ SOLN
1.0000 mg | INTRAMUSCULAR | Status: DC | PRN
  Administered 2013-06-09 (×2): 2 mg via INTRAVENOUS
  Administered 2013-06-10 (×2): 1 mg via INTRAVENOUS
  Administered 2013-06-10 – 2013-06-11 (×3): 2 mg via INTRAVENOUS
  Administered 2013-06-12: 1 mg via INTRAVENOUS
  Filled 2013-06-09 (×8): qty 2

## 2013-06-09 MED ORDER — TRAMADOL HCL 50 MG PO TABS
50.0000 mg | ORAL_TABLET | Freq: Four times a day (QID) | ORAL | Status: DC | PRN
  Administered 2013-06-09: 50 mg via ORAL
  Filled 2013-06-09: qty 1

## 2013-06-09 NOTE — Progress Notes (Signed)
[facility] PROGRESS NOTE  Duane [name] [id number] DOB: 09-28-1975 DOA: 06/04/2013 PCP: No PCP Per Patient  Assessment/Plan: #1 colitis Per abdominal CT. Patient presented with intractable nausea and vomiting. C. difficile PCR is pending. Patient with no BM. Patient with some clinical improvement. Continue empiric ciprofloxacin and IV Flagyl. IV fluids. Supportive care. GI is following and appreciate input and recommendations.  #2 intractable nausea and vomiting Questionable etiology. Likely multifactorial secondary to problem #1/acute colitis and gastroparesis. Acute abdominal series negative for any obstruction.  Patient after discontinuation of scheduled Zofran and started on clear liquids patient went intractable nausea, vomiting and upper abdominal pain. Patient is back on the scheduled IV Zofran and IV Reglan. CT of the abdomen and pelvis which was obtained was consistent with a transverse colon colitis. C. difficile PCR is pending, patient with no BM. Continue empiric IV ciprofloxacin and IV Flagyl. GI is following and appreciate input and recommendations. Continue bowel rest. Continue supportive care.  #3 hypertension Continue IV Lopressor. Will add IV enalapril. Hydralazine as needed. Pain management.  #4 history of gastroparesis Continue IV Reglan.  #5 poorly-controlled insulin-dependent diabetes mellitus Hemoglobin A1c 9.0  05/31/2013. CBG 140-150. Continue Lantus and sliding scale insulin.  #6 dehydration IV fluids.  #7 hypokalemia Repleted.  #8 prophylaxis PPI for GI prophylaxis. Lovenox for DVT prophylaxis.   Code Status: Full Family Communication: Updated patient no family at bedside. Disposition Plan: Home when medically stable.   Consultants:  Gastroenterology: Dr. Henrene Pastor 06/07/2013  Procedures:  Acute abdominal series 06/04/2013, 06/07/2013  CT abdomen and pelvis 4/26/ 2015  Antibiotics:  IV ciprofloxacin 06/07/2013  IV Flagyl  06/07/2013  HPI/Subjective: Patient with 6 episodes of emesis last night and 1 this morning. Patient still with some nausea. Patient with upper abdominal pain that he rates a 5/10. Patient asking to be started on clears.  Objective: Filed Vitals:   06/09/13 1650  BP: 170/98  Pulse: 103  Temp:   Resp:     Intake/Output Summary (Last 24 hours) at 06/09/13 1725 Last data filed at 06/09/13 1511  Gross per 24 hour  Intake 4426.25 ml  Output   2350 ml  Net 2076.25 ml   Filed Weights   06/05/13 0548 06/06/13 0500 06/08/13 0406  Weight: 128.7 kg (283 lb 11.7 oz) 131.04 kg (288 lb 14.3 oz) 129.5 kg (285 lb 7.9 oz)    Exam:   General:  NAD  Cardiovascular: RRR  Respiratory: CTAB  Abdomen: Soft, upper abdomen with decreased TTP, positive bowel sounds, nondistended.  Musculoskeletal: No clubbing cyanosis or edema  Data Reviewed: Basic Metabolic Panel:  Recent Labs Lab 06/04/13 1223 06/05/13 0438 06/06/13 0616 06/07/13 0600 06/08/13 0326 06/09/13 0450  NA 140 139 138 139 139 136*  K 3.6* 4.0 4.6 4.1 3.8 4.3  CL 101 101 104 102 99 96  CO2 24 24 24 24 26 24   GLUCOSE 281* 180* 161* 146* 110* 163*  BUN 9 7 10 8 7 8   CREATININE 1.12 1.02 1.21 1.21 1.14 1.08  CALCIUM 8.9 8.9 8.9 9.0 8.8 8.7  MG 1.7  --  2.2  --   --   --   PHOS 2.5  --   --   --   --   --    Liver Function Tests:  Recent Labs Lab 06/04/13 1223 06/08/13 0326  AST 12 12  ALT 17 13  ALKPHOS 96 70  BILITOT 0.4 0.4  PROT 7.2 6.2  ALBUMIN 4.0 3.3*    Recent Labs  Lab 06/04/13 1235  LIPASE 25   No results found for this basename: AMMONIA,  in the last 168 hours CBC:  Recent Labs Lab 06/04/13 1223 06/05/13 0438 06/06/13 0616 06/08/13 0326 06/09/13 0450  WBC 7.0 9.1 7.8 7.4 6.8  NEUTROABS 5.6  --   --   --   --   HGB 12.1* 12.3* 12.5* 12.3* 12.8*  HCT 36.0* 36.0* 37.9* 37.5* 37.7*  MCV 78.3 78.8 81.2 81.2 81.1  PLT 207 229 209 217 217   Cardiac Enzymes: No results found for this  basename: CKTOTAL, CKMB, CKMBINDEX, TROPONINI,  in the last 168 hours BNP (last 3 results) No results found for this basename: PROBNP,  in the last 8760 hours CBG:  Recent Labs Lab 06/08/13 1720 06/08/13 2015 06/09/13 0010 06/09/13 0613 06/09/13 0816  GLUCAP 115* 132* 165* 150* 140*    No results found for this or any previous visit (from the past 240 hour(s)).   Studies: No results found.  Scheduled Meds: . ciprofloxacin  400 mg Intravenous Q12H  . docusate sodium  100 mg Oral BID  . enalaprilat  0.625 mg Intravenous 4 times per day  . enoxaparin (LOVENOX) injection  65 mg Subcutaneous Q24H  . insulin aspart  0-20 Units Subcutaneous TID WC  . insulin glargine  15 Units Subcutaneous QHS  . metoCLOPramide (REGLAN) injection  10 mg Intravenous 4 times per day  . metoprolol  10 mg Intravenous 4 times per day  . metronidazole  500 mg Intravenous Q8H  . ondansetron (ZOFRAN) IV  8 mg Intravenous 3 times per day  . pantoprazole (PROTONIX) IV  40 mg Intravenous Q12H   Continuous Infusions: . sodium chloride 0.9 % 1,000 mL with potassium chloride 40 mEq infusion 125 mL/hr at 06/09/13 0957    Principal Problem:   Colitis Active Problems:   Gastroparesis   IDDM (insulin dependent diabetes mellitus)   HTN (hypertension)   Intractable nausea and vomiting   Dehydration   Hypokalemia    Time spent: [place] MD [facility] Pager [phone]. If 7PM-7AM, please contact night-coverage at [email or web address], password [facility] 06/09/2013, 5:25 PM  LOS: 5 days

## 2013-06-09 NOTE — Progress Notes (Signed)
    Progress Note   Subjective  vomited 6 times during the night. Not really taking any clears.    Objective   Vital signs in last 24 hours: Temp:  [98.2 F (36.8 C)-98.4 F (36.9 C)] 98.3 F (36.8 C) (04/28 0617) Pulse Rate:  [92-117] 96 (04/28 0617) Resp:  [20] 20 (04/28 0617) BP: (166-218)/(77-132) 166/77 mmHg (04/28 0617) SpO2:  [94 %-99 %] 94 % (04/28 0617) Last BM Date: 06/04/13 General:    Black male in NAD Heart:  Regular rate and rhythm Abdomen:  Soft, nontender and nondistended. Normal bowel sounds. Extremities:  Without edema. Neurologic:  Alert and oriented,  grossly normal neurologically. Psych:  Cooperative. Normal mood and affect.  Lab Results:  Recent Labs  06/08/13 0326 06/09/13 0450  WBC 7.4 6.8  HGB 12.3* 12.8*  HCT 37.5* 37.7*  PLT 217 217   BMET  Recent Labs  06/07/13 0600 06/08/13 0326 06/09/13 0450  NA 139 139 136*  K 4.1 3.8 4.3  CL 102 99 96  CO2 24 26 24   GLUCOSE 146* 110* 163*  BUN 8 7 8   CREATININE 1.21 1.14 1.08  CALCIUM 9.0 8.8 8.7     Assessment / Plan:   1. nausea and vomiting, history of gastroparesis. Nausea and vomiting may be secondary to gastroparesis but he has possible colitis by CTscan which could also be contributing. Blood sugars are well controlled at present, he isn't taking much PO and getting around the clock zofran, reglan with prn Phenergan. Unclear why he continues to have such vomiting. He is getting Dilaudid several times a day which isn't helping stomach empty. He takes the dilaudid for abdominal pain which he thinks is caused by the vomiting so we seem to be  going in circles.    Continue scheduled Zofran and Reglan, prn phenergan  Lets try and get him off Dilaudid, trial of ultram.  Colace not being taken on regular basis. Though he is isn't eating he hasn't had a BM since last Wed. Encouraged regular use of the colace.     2. ?Colitis. Diffuse bowel wall thickening of transverse colon on CTscan. He  reports several weeks of intermittent loose preceding last hospital admission a few days ago. Stools have been more on normal side since then. C-diff was canceled, no BMs since Wed. On Cipro / Flagyl. May need eventual repeat imaging vrs colonoscopy.   LOS: 5 days   Willia Craze  06/09/2013, 10:11 AM   [facility] Attending  I have also seen and assessed the patient and agree with the above note. He is still quite ill and not really responding to our treatment. He does not think that narcotics cause nausea Am going to run IV Toradol for pain. Stop Abx If he is not much better by tomorrow then aim for EGD and flex sig with hopes of reaching transverse colon (do not think he can tolerate oral prep).  [name], MD, [facility] [phone] (pager) 06/09/2013 6:01 PM'

## 2013-06-09 NOTE — Progress Notes (Addendum)
Enteric contact was d/c'd this am, pt states he has not had any BM's since Thursday last week 4/23. Per protocol if no BM in 48 hrs, contact can be d/c'd.

## 2013-06-10 LAB — BASIC METABOLIC PANEL
BUN: 9 mg/dL (ref 6–23)
CO2: 25 mEq/L (ref 19–32)
Calcium: 8.7 mg/dL (ref 8.4–10.5)
Chloride: 98 mEq/L (ref 96–112)
Creatinine, Ser: 1.14 mg/dL (ref 0.50–1.35)
GFR, EST NON AFRICAN AMERICAN: 81 mL/min — AB (ref >90)
Glucose, Bld: 117 mg/dL — ABNORMAL HIGH (ref 70–99)
Potassium: 4.3 mEq/L (ref 3.7–5.3)
SODIUM: 135 meq/L — AB (ref 137–147)

## 2013-06-10 LAB — CBC
HCT: 37 % — ABNORMAL LOW (ref 39.0–52.0)
Hemoglobin: 12.3 g/dL — ABNORMAL LOW (ref 13.0–17.0)
MCH: 26.8 pg (ref 26.0–34.0)
MCHC: 33.2 g/dL (ref 30.0–36.0)
MCV: 80.6 fL (ref 78.0–100.0)
Platelets: 232 10*3/uL (ref 150–400)
RBC: 4.59 MIL/uL (ref 4.22–5.81)
RDW: 13 % (ref 11.5–15.5)
WBC: 8.2 10*3/uL (ref 4.0–10.5)

## 2013-06-10 LAB — GLUCOSE, CAPILLARY
GLUCOSE-CAPILLARY: 176 mg/dL — AB (ref 70–99)
Glucose-Capillary: 108 mg/dL — ABNORMAL HIGH (ref 70–99)
Glucose-Capillary: 122 mg/dL — ABNORMAL HIGH (ref 70–99)
Glucose-Capillary: 139 mg/dL — ABNORMAL HIGH (ref 70–99)
Glucose-Capillary: 122 mg/dL — ABNORMAL HIGH (ref 70–99)

## 2013-06-10 MED ORDER — PANTOPRAZOLE SODIUM 40 MG PO TBEC
40.0000 mg | DELAYED_RELEASE_TABLET | Freq: Every day | ORAL | Status: DC
  Administered 2013-06-11 – 2013-06-12 (×2): 40 mg via ORAL
  Filled 2013-06-10 (×2): qty 1

## 2013-06-10 MED ORDER — AMLODIPINE BESYLATE 10 MG PO TABS
10.0000 mg | ORAL_TABLET | Freq: Every day | ORAL | Status: DC
  Administered 2013-06-10 – 2013-06-12 (×3): 10 mg via ORAL
  Filled 2013-06-10 (×3): qty 1

## 2013-06-10 MED ORDER — SODIUM CHLORIDE 0.9 % IV SOLN
INTRAVENOUS | Status: AC
  Administered 2013-06-10 – 2013-06-11 (×2): 50 mL via INTRAVENOUS

## 2013-06-10 MED ORDER — CLONIDINE HCL 0.1 MG PO TABS
0.1000 mg | ORAL_TABLET | Freq: Two times a day (BID) | ORAL | Status: DC
  Administered 2013-06-10 – 2013-06-12 (×4): 0.1 mg via ORAL
  Filled 2013-06-10 (×5): qty 1

## 2013-06-10 MED ORDER — BISACODYL 10 MG RE SUPP
10.0000 mg | Freq: Once | RECTAL | Status: AC
  Administered 2013-06-10: 10 mg via RECTAL
  Filled 2013-06-10: qty 1

## 2013-06-10 MED ORDER — METOPROLOL SUCCINATE ER 50 MG PO TB24
50.0000 mg | ORAL_TABLET | Freq: Every day | ORAL | Status: DC
  Administered 2013-06-10 – 2013-06-12 (×3): 50 mg via ORAL
  Filled 2013-06-10 (×3): qty 1

## 2013-06-10 NOTE — Progress Notes (Signed)
    Progress Note   Subjective  Nausea and vomiting improved   Objective   Vital signs in last 24 hours: Temp:  [98.2 F (36.8 C)-98.6 F (37 C)] 98.6 F (37 C) (04/29 0629) Pulse Rate:  [92-105] 98 (04/29 0629) Resp:  [18-20] 20 (04/29 0629) BP: (80-170)/(54-98) 154/82 mmHg (04/29 0629) SpO2:  [94 %-99 %] 98 % (04/29 0629) Last BM Date: 06/04/13 General:    black male in NAD Heart:  Regular rate and rhythm Abdomen:  Soft, nontender and nondistended. Normal bowel sounds. Extremities:  Without edema. Neurologic:  Alert and oriented,  grossly normal neurologically. Psych:  Cooperative. Normal mood and affect.     Lab Results:  Recent Labs  06/08/13 0326 06/09/13 0450 06/10/13 0455  WBC 7.4 6.8 8.2  HGB 12.3* 12.8* 12.3*  HCT 37.5* 37.7* 37.0*  PLT 217 217 232   BMET  Recent Labs  06/08/13 0326 06/09/13 0450 06/10/13 0455  NA 139 136* 135*  K 3.8 4.3 4.3  CL 99 96 98  CO2 26 24 25   GLUCOSE 110* 163* 117*  BUN 7 8 9   CREATININE 1.14 1.08 1.14  CALCIUM 8.8 8.7 8.7   LFT  Recent Labs  06/08/13 0326  PROT 6.2  ALBUMIN 3.3*  AST 12  ALT 13  ALKPHOS 70  BILITOT 0.4     Assessment / Plan:   1. nausea and vomiting, history of gastroparesis.  Blood sugars are well controlled at present. Nausea / vomiting improved. He has only had one dose of Dilaudid since yesterday. He will reserve dilaudid for severe pain. Getting Ultram and IV toradol now for mild to moderate pain. Tolerating sips of clears but real test will come with diet advancement. Plan is to increase clear liquid intake now and possibly try full liquids later today. If doesn't tolerate fulls then may need EGD in am for further evaluation. Continue scheduled IV Reglan and Zofran.  .  2. ?Colitis. Diffuse bowel wall thickening of transverse colon on CTscan. He reports several weeks of intermittent loose preceding last hospital admission. No diarrhea since. Will discontinue antibiotics. Will likely be  scheduled for sigmoidoscopy (probably can't tolerate PO prep for full colonoscopy) to be done in am   LOS: 5 days     LOS: 6 days   Duane [name]  06/10/2013, 9:01 AM  Seems somewhat better but given persistence and slow improvement + abnormal transverse colon on CT have orderd for sigmoidoscopy and EGD tomorrow. The risks and benefits as well as alternatives of endoscopic procedure(s) have been discussed and reviewed. All questions answered. The patient agrees to proceed.  [name], MD, [facility] [phone] (pager) 06/10/2013 5:54 PM

## 2013-06-10 NOTE — Progress Notes (Signed)
[facility] PROGRESS NOTE  Duane [name] [id number] DOB: 01/01/1976 DOA: 06/04/2013 PCP: No PCP Per Patient  Brief narrative: 38 year old male with past medical history of IDDM, gastroparesis, recent admission and hospitalization for gastroparesis who presented to [facility] ED 06/04/2013 with intractable nausea and vomiting with upper abdominal pain. It was thought that he has acute colitis based on CT abdomen. He does not have diarrhea so his stool was not sent for PCR analysis. He is on cipro and flagyl.   Assessment/Plan:   Principal Problem: Acute colitis - continue cipro and flagyl - continue IV fluids, NS @ 50 cc/hr for another 24 hours until better PO intake - now on CLD - appreciate GI following Active Problems: Nausea and vomiting, dehydration  - possibly due to gastroparesis and / or colitis - feels better this am - continue Reglan, Zofran, IV fluids Hypertension  - change BP meds to PO; metoprolol, Norvasc and add back home dose clonidine History of gastroparesis  - Continue IV Reglan.  Diabetes, type I, uncontrolled with neurologic manifestations, gastroparesis - Hemoglobin A1c 9.0 05/31/2013. CBG 140-150. Continue Lantus and sliding scale insulin.  Hypokalemia  - secondary to GI losses - repleted   Code Status: Full  Family Communication:family not at the bedside  Disposition Plan: Home when medically stable.   Consultants:  Gastroenterology: Dr. Henrene Pastor 06/07/2013 Procedures:  Acute abdominal series 06/04/2013, 06/07/2013  CT abdomen and pelvis 4/26/ 2015 Antibiotics:  IV ciprofloxacin 06/07/2013  IV Flagyl 06/07/2013   Robbie Lis, MD  [facility] Pager [phone]  If 7PM-7AM, please contact night-coverage [email or web address] Password [email or web address] 06/10/2013, 5:20 PM   LOS: 6 days   HPI/Subjective: Feels better this am.  Objective: Filed Vitals:   06/10/13 0629 06/10/13 0925 06/10/13 1000 06/10/13 1412  BP: 154/82  179/96 186/104  Pulse: 98  98 88   Temp: 98.6 F (37 C)  98.4 F (36.9 C) 98.2 F (36.8 C)  TempSrc: Oral  Oral Oral  Resp: 20  18 18   Height:      Weight:  128.5 kg (283 lb 4.7 oz)    SpO2: 98%  95% 96%    Intake/Output Summary (Last 24 hours) at 06/10/13 1720 Last data filed at 06/10/13 0948  Gross per 24 hour  Intake 2385.83 ml  Output    650 ml  Net 1735.83 ml    Exam:   General:  Pt is alert, follows commands appropriately, not in acute distress  Cardiovascular: Regular rate and rhythm, S1/S2, no murmurs, no rubs, no gallops  Respiratory: Clear to auscultation bilaterally, no wheezing, no crackles, no rhonchi  Abdomen: Soft, non tender, non distended, bowel sounds present, no guarding  Extremities: No edema, pulses DP and PT palpable bilaterally  Neuro: Grossly nonfocal  Data Reviewed: Basic Metabolic Panel:  Recent Labs Lab 06/04/13 1223  06/06/13 0616 06/07/13 0600 06/08/13 0326 06/09/13 0450 06/10/13 0455  NA 140  < > 138 139 139 136* 135*  K 3.6*  < > 4.6 4.1 3.8 4.3 4.3  CL 101  < > 104 102 99 96 98  CO2 24  < > 24 24 26 24 25   GLUCOSE 281*  < > 161* 146* 110* 163* 117*  BUN 9  < > 10 8 7 8 9   CREATININE 1.12  < > 1.21 1.21 1.14 1.08 1.14  CALCIUM 8.9  < > 8.9 9.0 8.8 8.7 8.7  MG 1.7  --  2.2  --   --   --   --  PHOS 2.5  --   --   --   --   --   --   < > = values in this interval not displayed. Liver Function Tests:  Recent Labs Lab 06/04/13 1223 06/08/13 0326  AST 12 12  ALT 17 13  ALKPHOS 96 70  BILITOT 0.4 0.4  PROT 7.2 6.2  ALBUMIN 4.0 3.3*    Recent Labs Lab 06/04/13 1235  LIPASE 25   No results found for this basename: AMMONIA,  in the last 168 hours CBC:  Recent Labs Lab 06/04/13 1223 06/05/13 0438 06/06/13 0616 06/08/13 0326 06/09/13 0450 06/10/13 0455  WBC 7.0 9.1 7.8 7.4 6.8 8.2  NEUTROABS 5.6  --   --   --   --   --   HGB 12.1* 12.3* 12.5* 12.3* 12.8* 12.3*  HCT 36.0* 36.0* 37.9* 37.5* 37.7* 37.0*  MCV 78.3 78.8 81.2 81.2 81.1 80.6  PLT  207 229 209 217 217 232   Cardiac Enzymes: No results found for this basename: CKTOTAL, CKMB, CKMBINDEX, TROPONINI,  in the last 168 hours BNP: No components found with this basename: POCBNP,  CBG:  Recent Labs Lab 06/09/13 2358 06/10/13 0427 06/10/13 0758 06/10/13 1147 06/10/13 1619  GLUCAP 122* 108* 139* 176* 122*    No results found for this or any previous visit (from the past 240 hour(s)).   Studies: No results found.  Scheduled Meds: . amLODipine  10 mg Oral Daily  . cloNIDine  0.1 mg Oral BID  . docusate sodium  100 mg Oral BID  . insulin aspart  0-20 Units Subcutaneous TID WC  . insulin glargine  15 Units Subcutaneous QHS  . ketorolac  30 mg Intravenous 4 times per day  . metoCLOPramide  10 mg Intravenous 4 times per day  . metoprolol succinate  50 mg Oral Daily  . ondansetron (ZOFRAN)  8 mg Intravenous 3 times per day  . pantoprazole  40 mg Oral Daily   Continuous Infusions: . sodium chloride 0.9 % 1,000 mL with potassium chloride 40 mEq infusion 50 mL/hr at 06/10/13 [phone]

## 2013-06-11 ENCOUNTER — Encounter (HOSPITAL_COMMUNITY): Payer: BC Managed Care – PPO | Admitting: Anesthesiology

## 2013-06-11 ENCOUNTER — Encounter (HOSPITAL_COMMUNITY)
Admission: EM | Disposition: A | Discharge status: Home / Self Care | Payer: Self-pay | Source: Home / Self Care | Attending: Internal Medicine

## 2013-06-11 ENCOUNTER — Inpatient Hospital Stay (HOSPITAL_COMMUNITY): Payer: BC Managed Care – PPO | Admitting: Anesthesiology

## 2013-06-11 HISTORY — PX: FLEXIBLE SIGMOIDOSCOPY: SHX5431

## 2013-06-11 HISTORY — PX: ESOPHAGOGASTRODUODENOSCOPY: SHX5428

## 2013-06-11 LAB — GLUCOSE, CAPILLARY
GLUCOSE-CAPILLARY: 118 mg/dL — AB (ref 70–99)
Glucose-Capillary: 101 mg/dL — ABNORMAL HIGH (ref 70–99)
Glucose-Capillary: 133 mg/dL — ABNORMAL HIGH (ref 70–99)
Glucose-Capillary: 147 mg/dL — ABNORMAL HIGH (ref 70–99)
Glucose-Capillary: 135 mg/dL — ABNORMAL HIGH (ref 70–99)
Glucose-Capillary: 116 mg/dL — ABNORMAL HIGH (ref 70–99)
Glucose-Capillary: 169 mg/dL — ABNORMAL HIGH (ref 70–99)
Glucose-Capillary: 208 mg/dL — ABNORMAL HIGH (ref 70–99)

## 2013-06-11 SURGERY — SIGMOIDOSCOPY, FLEXIBLE
Anesthesia: Monitor Anesthesia Care

## 2013-06-11 MED ORDER — PROPOFOL 10 MG/ML IV BOLUS
INTRAVENOUS | Status: DC | PRN
Start: 2013-06-11 — End: 2013-06-11
  Administered 2013-06-11: 25 mg via INTRAVENOUS
  Administered 2013-06-11: 50 mg via INTRAVENOUS
  Administered 2013-06-11: 25 mg via INTRAVENOUS
  Administered 2013-06-11 (×2): 50 mg via INTRAVENOUS

## 2013-06-11 MED ORDER — PROPOFOL 10 MG/ML IV BOLUS
INTRAVENOUS | Status: AC
  Filled 2013-06-11: qty 20

## 2013-06-11 MED ORDER — LACTATED RINGERS IV SOLN
INTRAVENOUS | Status: DC | PRN
  Administered 2013-06-11: 12:00:00 via INTRAVENOUS

## 2013-06-11 MED ORDER — PROMETHAZINE HCL 25 MG/ML IJ SOLN: 6.2500 mg | INTRAMUSCULAR | Status: DC | PRN

## 2013-06-11 MED ORDER — SODIUM CHLORIDE 0.9 % IV SOLN: INTRAVENOUS | Status: DC

## 2013-06-11 MED ORDER — LACTATED RINGERS IV SOLN: INTRAVENOUS | Status: DC

## 2013-06-11 NOTE — Anesthesia Preprocedure Evaluation (Addendum)
Anesthesia Evaluation  Patient identified by MRN, date of birth, ID band Patient awake    Reviewed: Allergy & Precautions, H&P , NPO status , Patient's Chart, lab work & pertinent test results  Airway Mallampati: II TM Distance: >3 FB Neck ROM: Full    Dental  (+) Teeth Intact, Dental Advisory Given   Pulmonary neg pulmonary ROS,  breath sounds clear to auscultation  Pulmonary exam normal       Cardiovascular hypertension, Pt. on home beta blockers and Pt. on medications Rhythm:Regular Rate:Normal     Neuro/Psych negative neurological ROS  negative psych ROS   GI/Hepatic Neg liver ROS, GERD-  Medicated,Gastritis, gastroparesis   Endo/Other  negative endocrine ROSdiabetes, Type 1, Insulin DependentMorbid obesity  Renal/GU negative Renal ROS  negative genitourinary   Musculoskeletal negative musculoskeletal ROS (+)   Abdominal   Peds  Hematology negative hematology ROS (+)   Anesthesia Other Findings   Reproductive/Obstetrics                          Anesthesia Physical Anesthesia Plan  ASA: III  Anesthesia Plan: MAC   Post-op Pain Management:    Induction: Intravenous  Airway Management Planned: Simple Face Mask and Nasal Cannula  Additional Equipment:   Intra-op Plan:   Post-operative Plan:   Informed Consent: I have reviewed the patients History and Physical, chart, labs and discussed the procedure including the risks, benefits and alternatives for the proposed anesthesia with the patient or authorized representative who has indicated his/her understanding and acceptance.   Dental advisory given  Plan Discussed with: CRNA  Anesthesia Plan Comments:         Anesthesia Quick Evaluation

## 2013-06-11 NOTE — Op Note (Signed)
[facility] [place], [address]   FLEXIBLE SIGMOIDOSCOPY PROCEDURE REPORT  PATIENT: Lundy, Balderama  MR#: [id number] BIRTHDATE: 1975/10/22 , 37  yrs. old GENDER: Male ENDOSCOPIST: [name], MD, [facility] PROCEDURE DATE:  06/11/2013 PROCEDURE:   Sigmoidoscopy, diagnostic ASA CLASS:   Class III INDICATIONS:an abnormal CT. Colitis on CT MEDICATIONS: See Anesthesia Report.  DESCRIPTION OF PROCEDURE:   After the risks benefits and alternatives of the procedure were thoroughly explained, informed consent was obtained.  revealed no abnormalities of the rectum. The endoscope was introduced through the anus  and advanced to the mid transverse colon , limited by No adverse events experienced. The quality of the prep was fair .  The instrument was then slowly withdrawn as the mucosa was fully examined.         COLON FINDINGS: The colonic mucosa appeared normal in the rectum, sigmoid colon, descending colon, at the splenic flexure, and in the transverse colon. Retroflexed views revealed no abnormalities.    The scope was then withdrawn from the patient and the procedure terminated.  COMPLICATIONS: There were no complications.  ENDOSCOPIC IMPRESSION: The colonic mucosa appeared normal in the rectum, sigmoid colon, descending colon, at the splenic flexure, and in the transverse colon  RECOMMENDATIONS: continue care for gastroparesis and try to advance diet do not think he had colitis - probably CT artifact/overcall/physiologic      eSigned:  [name], MD, [facility] 06/11/2013 12:34 PM

## 2013-06-11 NOTE — Op Note (Signed)
[facility] [place], [address]   ENDOSCOPY PROCEDURE REPORT  PATIENT: Duane [name], Duane [name]  MR#: [id number] BIRTHDATE: [birth year] , 37  yrs. old GENDER: Male ENDOSCOPIST: [name], MD, [facility] PROCEDURE DATE:  06/11/2013 PROCEDURE:  EGD, diagnostic ASA CLASS:     Class III INDICATIONS:  Vomiting. MEDICATIONS: See Anesthesia Report. TOPICAL ANESTHETIC: none  DESCRIPTION OF PROCEDURE: After the risks benefits and alternatives of the procedure were thoroughly explained, informed consent was obtained.  The    endoscope was introduced through the mouth and advanced to the second portion of the duodenum. Without limitations.  The instrument was slowly withdrawn as the mucosa was fully examined.        STOMACH: A medium sized hiatal hernia was noted.  The remainder of the upper endoscopy exam was otherwise normal. Retroflexed views revealed a hiatal hernia.     The scope was then withdrawn from the patient and the procedure completed.  COMPLICATIONS: There were no complications. ENDOSCOPIC IMPRESSION: 1.   Medium sized hiatal hernia 2.   The remainder of the upper endoscopy exam was otherwise normal  RECOMMENDATIONS: Continue gastroparesis Tx    eSigned:  [name], MD, [facility] 06/11/2013 12:36 PM

## 2013-06-11 NOTE — Progress Notes (Signed)
540ml tap water enema administered rectally as ordered.

## 2013-06-11 NOTE — Interval H&P Note (Signed)
History and Physical Interval Note:  06/11/2013 12:07 PM  Duane [name]  has presented today for surgery, with the diagnosis of colitis on CT, vomiting  The various methods of treatment have been discussed with the patient and family. After consideration of risks, benefits and other options for treatment, the patient has consented to  Procedure(s): FLEXIBLE SIGMOIDOSCOPY (N/A) ESOPHAGOGASTRODUODENOSCOPY (EGD) (N/A) as a surgical intervention .  The patient's history has been reviewed, patient examined, no change in status, stable for surgery.  I have reviewed the patient's chart and labs.  Questions were answered to the patient's satisfaction.     [name]

## 2013-06-11 NOTE — Progress Notes (Signed)
Administered tap water enema.  Poor results from first enema, expelled water only from first enema

## 2013-06-11 NOTE — Progress Notes (Signed)
Patient informed to prepare for tap water enema in preparation for sigmoid/egd at 10am.  Patient expressed he felt better today and really did not want to do this, we discussed reasons for the enema.

## 2013-06-11 NOTE — H&P (View-Only) (Signed)
    Progress Note   Subjective  Nausea and vomiting improved   Objective   Vital signs in last 24 hours: Temp:  [98.2 F (36.8 C)-98.6 F (37 C)] 98.6 F (37 C) (04/29 0629) Pulse Rate:  [92-105] 98 (04/29 0629) Resp:  [18-20] 20 (04/29 0629) BP: (80-170)/(54-98) 154/82 mmHg (04/29 0629) SpO2:  [94 %-99 %] 98 % (04/29 0629) Last BM Date: 06/04/13 General:    black male in NAD Heart:  Regular rate and rhythm Abdomen:  Soft, nontender and nondistended. Normal bowel sounds. Extremities:  Without edema. Neurologic:  Alert and oriented,  grossly normal neurologically. Psych:  Cooperative. Normal mood and affect.     Lab Results:  Recent Labs  06/08/13 0326 06/09/13 0450 06/10/13 0455  WBC 7.4 6.8 8.2  HGB 12.3* 12.8* 12.3*  HCT 37.5* 37.7* 37.0*  PLT 217 217 232   BMET  Recent Labs  06/08/13 0326 06/09/13 0450 06/10/13 0455  NA 139 136* 135*  K 3.8 4.3 4.3  CL 99 96 98  CO2 26 24 25   GLUCOSE 110* 163* 117*  BUN 7 8 9   CREATININE 1.14 1.08 1.14  CALCIUM 8.8 8.7 8.7   LFT  Recent Labs  06/08/13 0326  PROT 6.2  ALBUMIN 3.3*  AST 12  ALT 13  ALKPHOS 70  BILITOT 0.4     Assessment / Plan:   1. nausea and vomiting, history of gastroparesis.  Blood sugars are well controlled at present. Nausea / vomiting improved. He has only had one dose of Dilaudid since yesterday. He will reserve dilaudid for severe pain. Getting Ultram and IV toradol now for mild to moderate pain. Tolerating sips of clears but real test will come with diet advancement. Plan is to increase clear liquid intake now and possibly try full liquids later today. If doesn't tolerate fulls then may need EGD in am for further evaluation. Continue scheduled IV Reglan and Zofran.  .  2. ?Colitis. Diffuse bowel wall thickening of transverse colon on CTscan. He reports several weeks of intermittent loose preceding last hospital admission. No diarrhea since. Will discontinue antibiotics. Will likely be  scheduled for sigmoidoscopy (probably can't tolerate PO prep for full colonoscopy) to be done in am   LOS: 5 days     LOS: 6 days   Duane [name]  06/10/2013, 9:01 AM  Seems somewhat better but given persistence and slow improvement + abnormal transverse colon on CT have orderd for sigmoidoscopy and EGD tomorrow. The risks and benefits as well as alternatives of endoscopic procedure(s) have been discussed and reviewed. All questions answered. The patient agrees to proceed.  [name], MD, [facility] [phone] (pager) 06/10/2013 5:54 PM

## 2013-06-11 NOTE — Progress Notes (Signed)
Patient received from ENDO, alert and oriented.  Patient vital signs obtained and charted, no complaints of pain at this time.  Orders reviewed with patient, ordered full liquid meal.  Passing flatus.

## 2013-06-11 NOTE — Anesthesia Postprocedure Evaluation (Signed)
Anesthesia Post Note  Patient: Duane [name]  Procedure(s) Performed: Procedure(s) (LRB): FLEXIBLE SIGMOIDOSCOPY (N/A) ESOPHAGOGASTRODUODENOSCOPY (EGD) (N/A)  Anesthesia type: MAC  Patient location: PACU  Post pain: Pain level controlled  Post assessment: Post-op Vital signs reviewed  Last Vitals:  Filed Vitals:   06/11/13 1257  BP: 173/93  Pulse: 86  Temp: 36.7 C  Resp: 18    Post vital signs: Reviewed  Level of consciousness: sedated  Complications: No apparent anesthesia complications

## 2013-06-11 NOTE — Progress Notes (Signed)
[facility] PROGRESS NOTE  Duane [name] [id number] DOB: [birth year] DOA: 06/04/2013 PCP: No PCP Per Patient  Brief narrative: 38 year old male with past medical history of IDDM, gastroparesis, recent admission and hospitalization for gastroparesis who presented to [facility] ED 06/04/2013 with intractable nausea and vomiting with upper abdominal pain. It was thought that he has acute colitis based on CT abdomen. He does not have diarrhea so his stool was not sent for PCR analysis. He is on cipro and flagyl.   Assessment/Plan:   Principal Problem:  Acute colitis  - he had flexible sigmoidoscopy with no significant findings; EGD showed hiatal hernia otherwise unremarkable. Likely an artifact seen on CT scan and less likely colitis - stop cipro and flagyl per GI - advance diet per GI  Active Problems:  Nausea and vomiting, dehydration  - due to gastroparesiss  - continue Reglan, Zofran, IV fluids  Hypertension  - Continue metoprolol, Norvasc and add back home dose clonidine  History of gastroparesis  - Continue IV Reglan.  Diabetes, type I, uncontrolled with neurologic manifestations, gastroparesis  - Hemoglobin A1c 9.0 05/31/2013. CBG 140-150. Continue Lantus and sliding scale insulin.  - CBG's in past 24 hours: 147, 118, 116 Hypokalemia  - secondary to GI losses  - repleted    Code Status: Full  Family Communication: family not at the bedside  Disposition Plan: Home when medically stable.   Consultants:  Gastroenterology: Dr. Henrene Pastor 06/07/2013 Procedures:  Acute abdominal series 06/04/2013, 06/07/2013  CT abdomen and pelvis 4/26/ 2015 EGD and flexible sigmoidoscopy 06/11/2013  Antibiotics:  IV ciprofloxacin 06/07/2013 --> 06/11/2013 IV Flagyl 06/07/2013 --> 06/11/2013    Robbie Lis, MD  [facility] Pager [phone]  If 7PM-7AM, please contact night-coverage [email or web address] Password [facility] 06/11/2013, 4:51 PM   LOS: 7 days    HPI/Subjective: No acute overnight  events.  Objective: Filed Vitals:   06/11/13 1103 06/11/13 1210 06/11/13 1233 06/11/13 1257  BP: 169/97 166/108 176/106 173/93  Pulse: 84 78 82 86  Temp:   98.6 F (37 C) 98 F (36.7 C)  TempSrc:    Oral  Resp: 16 16 17 18   Height:      Weight:      SpO2: 98%  100% 100%    Intake/Output Summary (Last 24 hours) at 06/11/13 1651 Last data filed at 06/11/13 1500  Gross per 24 hour  Intake 939.17 ml  Output    750 ml  Net 189.17 ml    Exam:   General:  Pt is alert, follows commands appropriately, not in acute distress  Cardiovascular: Regular rate and rhythm, S1/S2, no murmurs, no rubs, no gallops  Respiratory: Clear to auscultation bilaterally, no wheezing, no crackles, no rhonchi  Abdomen: Soft, non tender, non distended, bowel sounds present, no guarding  Extremities: No edema, pulses DP and PT palpable bilaterally  Neuro: Grossly nonfocal  Data Reviewed: Basic Metabolic Panel:  Recent Labs Lab 06/06/13 0616 06/07/13 0600 06/08/13 0326 06/09/13 0450 06/10/13 0455  NA 138 139 139 136* 135*  K 4.6 4.1 3.8 4.3 4.3  CL 104 102 99 96 98  CO2 24 24 26 24 25   GLUCOSE 161* 146* 110* 163* 117*  BUN 10 8 7 8 9   CREATININE 1.21 1.21 1.14 1.08 1.14  CALCIUM 8.9 9.0 8.8 8.7 8.7  MG 2.2  --   --   --   --    Liver Function Tests:  Recent Labs Lab 06/08/13 0326  AST 12  ALT 13  ALKPHOS 70  BILITOT 0.4  PROT 6.2  ALBUMIN 3.3*   No results found for this basename: LIPASE, AMYLASE,  in the last 168 hours No results found for this basename: AMMONIA,  in the last 168 hours CBC:  Recent Labs Lab 06/05/13 0438 06/06/13 0616 06/08/13 0326 06/09/13 0450 06/10/13 0455  WBC 9.1 7.8 7.4 6.8 8.2  HGB 12.3* 12.5* 12.3* 12.8* 12.3*  HCT 36.0* 37.9* 37.5* 37.7* 37.0*  MCV 78.8 81.2 81.2 81.1 80.6  PLT 229 209 217 217 232   Cardiac Enzymes: No results found for this basename: CKTOTAL, CKMB, CKMBINDEX, TROPONINI,  in the last 168 hours BNP: No components  found with this basename: POCBNP,  CBG:  Recent Labs Lab 06/10/13 2355 06/11/13 0348 06/11/13 0825 06/11/13 1102 06/11/13 1301  GLUCAP 133* 101* 147* 118* 116*    No results found for this or any previous visit (from the past 240 hour(s)).   Studies: No results found.  Scheduled Meds: . amLODipine  10 mg Oral Daily  . cloNIDine  0.1 mg Oral BID  . docusate sodium  100 mg Oral BID  . insulin aspart  0-20 Units Subcutaneous TID WC  . insulin glargine  15 Units Subcutaneous QHS  . metoCLOPramide (REGLAN) injection  10 mg Intravenous 4 times per day  . metoprolol succinate  50 mg Oral Daily  . ondansetron (ZOFRAN) IV  8 mg Intravenous 3 times per day  . pantoprazole  40 mg Oral Daily   Continuous Infusions: . sodium chloride 50 mL (06/11/13 1325)  . lactated ringers

## 2013-06-11 NOTE — Transfer of Care (Signed)
Immediate Anesthesia Transfer of Care Note  Patient: Duane [name]  Procedure(s) Performed: Procedure(s): FLEXIBLE SIGMOIDOSCOPY (N/A) ESOPHAGOGASTRODUODENOSCOPY (EGD) (N/A)  Patient Location: PACU  Anesthesia Type:MAC  Level of Consciousness: awake, alert , oriented and patient cooperative  Airway & Oxygen Therapy: Patient Spontanous Breathing and Patient connected to nasal cannula oxygen  Post-op Assessment: Report given to PACU RN and Post -op Vital signs reviewed and stable  Post vital signs: Reviewed and stable  Complications: No apparent anesthesia complications

## 2013-06-11 NOTE — Progress Notes (Signed)
Discussed order for SCD and reason for wearing these while in the hospital, but patient stated he did not feel he needed these and would not be wearing.  SCDs returned to portable equipment

## 2013-06-12 ENCOUNTER — Encounter (HOSPITAL_COMMUNITY): Payer: Self-pay | Admitting: Internal Medicine

## 2013-06-12 DIAGNOSIS — N179 Acute kidney failure, unspecified: Secondary | ICD-10-CM

## 2013-06-12 LAB — GLUCOSE, CAPILLARY
GLUCOSE-CAPILLARY: 228 mg/dL — AB (ref 70–99)
GLUCOSE-CAPILLARY: 238 mg/dL — AB (ref 70–99)
GLUCOSE-CAPILLARY: 290 mg/dL — AB (ref 70–99)

## 2013-06-12 MED ORDER — INSULIN GLARGINE 100 UNIT/ML ~~LOC~~ SOLN: 25.0000 [IU] | Freq: Every day | SUBCUTANEOUS | Status: DC

## 2013-06-12 MED ORDER — TRAMADOL HCL 50 MG PO TABS: 50.0000 mg | ORAL_TABLET | Freq: Four times a day (QID) | ORAL | Status: DC | PRN

## 2013-06-12 MED ORDER — DSS 100 MG PO CAPS: 100.0000 mg | ORAL_CAPSULE | Freq: Two times a day (BID) | ORAL | Status: DC | PRN

## 2013-06-12 MED ORDER — HYDROCODONE-ACETAMINOPHEN 5-325 MG PO TABS: 1.0000 | ORAL_TABLET | ORAL | Status: DC | PRN

## 2013-06-12 MED ORDER — METOCLOPRAMIDE HCL 10 MG PO TABS: 10.0000 mg | ORAL_TABLET | Freq: Four times a day (QID) | ORAL | Status: DC

## 2013-06-12 MED ORDER — METOCLOPRAMIDE HCL 10 MG PO TABS: 10.0000 mg | ORAL_TABLET | Freq: Four times a day (QID) | ORAL | Status: DC | PRN

## 2013-06-12 NOTE — Plan of Care (Signed)
Problem: Food- and Nutrition-Related Knowledge Deficit (NB-1.1) Goal: Nutrition education Formal process to instruct or train a patient/client in a skill or to impart knowledge to help patients/clients voluntarily manage or modify food choices and eating behavior to maintain or improve health. Outcome: Completed/Met Date Met:  06/12/13 -RD received consult regarding gastroparesis nutrition therapy -Encouraged intake of small frequent meals, low fiber foods, dairy products/milkshakes, and decreasing intake of high fat food sources -Pt reported to have discussed nutrition therapy with RD during previous admits; however finds it difficult to comply with recommendations d/t current working situation. Pt works third shift and has not been able to space out meals appropriately -Discussed possible low fiber/appropriate dairy products snacks to pre-pack and bring to work-saltines w/peanut butter, yogurt, Carnation Instant Breakfast, Boost Carb Control/Glucerna, half sandwiches on white bread, canned/well cooked fruits -Pt also consumes large amount of carbonated beverages, encouraged pt to substitute flavored water or unsweetened ice tea -Pt verbalized understanding. Was appreciative of handout and additional information -Expect good compliance. Pt appeared to have done research regarding diet pta, and was motivated to incorporate additional diet recommendations to alleviate symptoms  No additional interventions warranted at this time. Please re-consult as needed [place] Clinical Dietitian [phone]

## 2013-06-12 NOTE — Progress Notes (Signed)
Patient discharged home in stable condition; no change from morning assessment. Discharge instructions and scripts given to patient with verbal feedback and understanding.

## 2013-06-12 NOTE — Discharge Instructions (Signed)
Gastroparesis  Gastroparesis is also called slowed stomach emptying (delayed gastric emptying). It is a condition in which the stomach takes too long to empty its contents. It often happens in people with diabetes.  CAUSES  Gastroparesis happens when nerves to the stomach are damaged or stop working. When the nerves are damaged, the muscles of the stomach and intestines do not work normally. The movement of food is slowed or stopped. High blood glucose (sugar) causes changes in nerves and can damage the blood vessels that carry oxygen and nutrients to the nerves. RISK FACTORS  Diabetes.  Post-viral syndromes.  Eating disorders (anorexia, bulimia).  Surgery on the stomach or vagus nerve.  Gastroesophageal reflux disease (rarely).  Smooth muscle disorders (amyloidosis, scleroderma).  Metabolic disorders, including hypothyroidism.  Parkinson disease. SYMPTOMS   Heartburn.  Feeling sick to your stomach (nausea).  Vomiting of undigested food.  An early feeling of fullness when eating.  Weight loss.  Abdominal bloating.  Erratic blood glucose levels.  Lack of appetite.  Gastroesophageal reflux.  Spasms of the stomach wall. Complications can include:  Bacterial overgrowth in stomach. Food stays in the stomach and can ferment and cause bacteria to grow.  Weight loss due to difficulty digesting and absorbing nutrients.  Vomiting.  Obstruction in the stomach. Undigested food can harden and cause nausea and vomiting.  Blood glucose fluctuations caused by inconsistent food absorption. DIAGNOSIS  The diagnosis of gastroparesis is confirmed through one or more of the following tests:  Barium X-rays and scans. These tests look at how long it takes for food to move through the stomach.  Gastric manometry. This test measures electrical and muscular activity in the stomach. A thin tube is passed down the throat into the stomach. The tube contains a wire that takes measurements  of the stomach's electrical and muscular activity as it digests liquids and solid food.  Endoscopy. This procedure is done with a long, thin tube called an endoscope. It is passed through the mouth and gently down the esophagus into the stomach. This tube helps the caregiver look at the lining of the stomach to check for any abnormalities.  Ultrasonography. This can rule out gallbladder disease or pancreatitis. This test will outline and define the shape of the gallbladder and pancreas. TREATMENT   Treatments may include:  Exercise.  Medicines to control nausea and vomiting.  Medicines to stimulate stomach muscles.  Changes in what and when you eat.  Having smaller meals more often.  Eating low-fiber forms of high-fiber foods, such as eating cooked vegetables instead of raw vegetables.  Eating low-fat foods.  Consuming liquids, which are easier to digest.  In severe cases, feeding tubes and intravenous (IV) feeding may be needed. It is important to note that in most cases, treatment does not cure gastroparesis. It is usually a lasting (chronic) condition. Treatment helps you manage the underlying condition so that you can be as healthy and comfortable as possible. Other treatments  A gastric neurostimulator has been developed to assist people with gastroparesis. The battery-operated device is surgically implanted. It emits mild electrical pulses to help improve stomach emptying and to control nausea and vomiting.  The use of botulinum toxin has been shown to improve stomach emptying by decreasing the prolonged contractions of the muscle between the stomach and the small intestine (pyloric sphincter). The benefits are temporary. SEEK MEDICAL CARE IF:   You have diabetes and you are having problems keeping your blood glucose in goal range.  You are having nausea,   vomiting, bloating, or early feelings of fullness with eating.  Your symptoms do not change with a change in  diet. Document Released: 01/29/2005 Document Revised: 05/26/2012 Document Reviewed: 07/08/2008 Exit[employer] Patient Information 2014 [employer].  

## 2013-06-12 NOTE — Progress Notes (Signed)
    Progress Note   Subjective  Tolerating fulls. No vomiting.   Objective   Vital signs in last 24 hours: Temp:  [97.9 F (36.6 C)-98.6 F (37 C)] 98.3 F (36.8 C) (05/01 0500) Pulse Rate:  [66-86] 84 (05/01 0500) Resp:  [16-18] 18 (05/01 0500) BP: (117-176)/(66-108) 117/72 mmHg (05/01 0500) SpO2:  [93 %-100 %] 93 % (05/01 0500) Last BM Date: 06/04/13 General:    black male in NAD Abdomen:  Soft, nontender and nondistended. Hypoactive bowel sounds. Neurologic:  Alert and oriented,  grossly normal neurologically. Psych:  Cooperative. Normal mood and affect.   Assessment / Plan:   1. nausea and vomiting, likely gastroparesis. EGD negative except for medium hiatal hernia.  CBGs are now 220-290 (need to be better controlled to promote gastric emptying). He is using less narcotics (only one dose of dilaudid a day now) so this is helpful. Remains on scheduled Reglan and Zofran. Hopefully home today. He takes anti-emetics at home prn. He takes Reglan at home. Encouraged small frequent meals. Reiterated importance of good blood glucose control at home (at least under 200).  .  2. Transverse colitis by CTscan. Normal transverse colon on sigmoidoscopy yesterday.    LOS: 8 days   Duane [name]  06/12/2013, 9:45 AM   Agree w/ Ms. Vanita Ingles note - no new recommendations Appears to be going home today   [name], MD, [facility] [phone] (pager) 06/12/2013 1:13 PM

## 2013-06-12 NOTE — Discharge Summary (Addendum)
Physician Discharge Summary  Duane [name] [id number] DOB: [birth year] DOA: 06/04/2013  PCP: No PCP Per Patient  Admit date: 06/04/2013 Discharge date: 06/12/2013  Recommendations for Outpatient Follow-up:  1. Please note that insulin is now 25 units at bedtime.  2. Monitor CBG at home at least twice a day and present the log of numbers to your PCP 3. Please note I spoke with the patient extensively about Reglan and side effects. Patient is adamant about changing his diet and exercising to help with blood sugar control. Rectal and side effects  include but are not limited to dyskinesia. Patient prefers to be on as needed regimen which is acceptable at this time. If he makes necessary adjustments to his diet and lifestyle regimen as well as ensure insulin compliance hopefully his gastroparesis is going to improve.   Discharge Diagnoses:  Active Problems:   Gastroparesis   IDDM (insulin dependent diabetes mellitus)   Intractable nausea and vomiting   HTN (hypertension)   Dehydration   Hypokalemia    Discharge Condition: medically stable for discharge home today; pt reports no pain this am; he tolerated the diabetic diet  Diet recommendation: diabetic diet, smaller frequent meals   History of present illness:  38 year old male with past medical history of IDDM, gastroparesis, recent admission and hospitalization for gastroparesis who presented to [facility] ED 06/04/2013 with intractable nausea and vomiting with upper abdominal pain. It was thought that he has acute colitis based on CT abdomen. He does not have diarrhea so his stool was not sent for PCR analysis. He was on cipro and flagyl.   Assessment/Plan:   Principal Problem:  Acute colitis  - he had flexible sigmoidoscopy with no significant findings; EGD showed hiatal hernia otherwise unremarkable. Likely an artifact seen on CT scan and less likely colitis  - stopped  cipro and flagyl per GI 06/11/2013  Active Problems:  Nausea and  vomiting, dehydration  - due to gastroparesiss  - continue Reglan Hypertension  - Continue metoprolol, Norvasc and clonidine  History of gastroparesis  - Continue Reglan.  Diabetes, type I, uncontrolled with neurologic manifestations, gastroparesis  - Hemoglobin A1c 9.0 05/31/2013. Continue Lantus 25 units at bedtime  Hypokalemia  - secondary to GI losses  - repleted   Code Status: Full  Family Communication: family not at the bedside   Consultants:  Gastroenterology: Dr. Henrene Pastor 06/07/2013 Procedures:  Acute abdominal series 06/04/2013, 06/07/2013  CT abdomen and pelvis 4/26/ 2015  EGD and flexible sigmoidoscopy 06/11/2013  Antibiotics:  IV ciprofloxacin 06/07/2013 --> 06/11/2013  IV Flagyl 06/07/2013 --> 06/11/2013   Signed:  Robbie Lis, MD  [facility] 06/12/2013, 11:54 AM  Pager #: [phone]   Discharge Exam: Filed Vitals:   06/12/13 1023  BP: 152/93  Pulse: 75  Temp: 97.8 F (36.6 C)  Resp: 18   Filed Vitals:   06/11/13 2000 06/12/13 0200 06/12/13 0500 06/12/13 1023  BP: 147/82 129/66 117/72 152/93  Pulse: 79 66 84 75  Temp: 98.5 F (36.9 C) 97.9 F (36.6 C) 98.3 F (36.8 C) 97.8 F (36.6 C)  TempSrc: Oral Oral Oral Oral  Resp: 18 18 18 18   Height:      Weight:      SpO2: 97% 98% 93% 98%    General: Pt is alert, follows commands appropriately, not in acute distress Cardiovascular: Regular rate and rhythm, S1/S2 +, no murmurs, no rubs, no gallops Respiratory: Clear to auscultation bilaterally, no wheezing, no crackles, no rhonchi Abdominal: Soft, non  tender, non distended, bowel sounds +, no guarding Extremities: no edema, no cyanosis, pulses palpable bilaterally DP and PT Neuro: Grossly nonfocal  Discharge Instructions  Discharge Orders   Future Orders Complete By Expires   Call MD for:  difficulty breathing, headache or visual disturbances  As directed    Call MD for:  persistant dizziness or light-headedness  As directed    Call MD  for:  persistant nausea and vomiting  As directed    Call MD for:  severe uncontrolled pain  As directed    Diet - low sodium heart healthy  As directed    Discharge instructions  As directed    Increase activity slowly  As directed        Medication List         acetaminophen 500 MG tablet  Commonly known as:  TYLENOL  Take 500 mg by mouth every 6 (six) hours as needed for mild pain.     amLODipine 10 MG tablet  Commonly known as:  NORVASC  Take 1 tablet (10 mg total) by mouth daily.     cloNIDine 0.1 MG tablet  Commonly known as:  CATAPRES  Take 0.1 mg by mouth 2 (two) times daily.     DSS 100 MG Caps  Take 100 mg by mouth 2 (two) times daily as needed for mild constipation.     gabapentin 300 MG capsule  Commonly known as:  NEURONTIN  Take 600 mg by mouth at bedtime.     HYDROcodone-acetaminophen 5-325 MG per tablet  Commonly known as:  NORCO/VICODIN  Take 1-2 tablets by mouth every 4 (four) hours as needed for moderate pain.     insulin aspart 100 UNIT/ML injection  Commonly known as:  novoLOG  Inject 9-12 Units into the skin 3 (three) times daily before meals.     insulin glargine 100 UNIT/ML injection  Commonly known as:  LANTUS  Inject 0.25 mLs (25 Units total) into the skin at bedtime.     metoCLOPramide 10 MG tablet  Commonly known as:  REGLAN  Take 1 tablet (10 mg total) by mouth every 6 (six) hours as needed for nausea.     metoprolol succinate 50 MG 24 hr tablet  Commonly known as:  TOPROL-XL  Take 50 mg by mouth daily. Take with or immediately following a meal.     pantoprazole 40 MG tablet  Commonly known as:  PROTONIX  Take 40 mg by mouth daily.     sucralfate 1 G tablet  Commonly known as:  CARAFATE  Take 1 g by mouth 4 (four) times daily -  before meals and at bedtime.     traMADol 50 MG tablet  Commonly known as:  ULTRAM  Take 1 tablet (50 mg total) by mouth every 6 (six) hours as needed for moderate pain.            Follow-up  Information   Follow up with No PCP Per Patient.   Specialty:  General Practice       The results of significant diagnostics from this hospitalization (including imaging, microbiology, ancillary and laboratory) are listed below for reference.    Significant Diagnostic Studies: Ct Abdomen Pelvis W Contrast  06/07/2013   CLINICAL DATA:  Intractable nausea and vomiting. Diffuse abdominal pain.  EXAM: CT ABDOMEN AND PELVIS WITH CONTRAST  TECHNIQUE: Multidetector CT imaging of the abdomen and pelvis was performed using the standard protocol following bolus administration of intravenous contrast.  CONTRAST:  46mL  OMNIPAQUE IOHEXOL 300 MG/ML SOLN, 126mL OMNIPAQUE IOHEXOL 300 MG/ML SOLN  COMPARISON:  Abdominal radiographs earlier the same day  FINDINGS: Minimal dependent atelectasis is present in the lung bases. There is no pleural effusion.  1 cm hypodensity in hepatic segment VII/VIII is too small to fully characterize. The gallbladder is surgically absent. 1 cm splenule is noted inferior to the spleen. The adrenal glands, kidneys, and pancreas have an unremarkable enhanced appearance.  The small bowel is decompressed. There is diffuse wall thickening involving the transverse colon. The more distal colon as well as the ascending colon are unremarkable, with a small amount of stool present. Appendix is identified in the right lower quadrant/ upper pelvis and is unremarkable. The SMA and SMV are grossly patent, as is the IMA.  Minimal atherosclerotic calcifications is noted at the common iliac artery bifurcations. No free fluid or enlarged lymph nodes are identified. No acute osseous abnormality is identified.  IMPRESSION: 1. Diffuse wall thickening involving the transverse colon, consistent with colitis. 2. 1 cm low-density lesion in the liver, too small to characterize.   Electronically Signed   By: Logan Bores   On: 06/07/2013 15:08   Dg Abd Acute W/chest  06/07/2013   CLINICAL DATA:  Nausea and vomiting.   Epigastric pain.  EXAM: ACUTE ABDOMEN SERIES (ABDOMEN 2 VIEW & CHEST 1 VIEW)  COMPARISON:  06/04/2013  FINDINGS: The cardiomediastinal silhouette is within normal limits. The lungs are well inflated and clear. There is no evidence of pleural effusion or pneumothorax. No acute osseous abnormality is identified.  There is no evidence of intraperitoneal free air. No air-fluid levels are seen. There is a moderate amount of stool in the colon, similar to prior. No dilated loops of bowel are seen. Right upper quadrant surgical clips are again noted.  IMPRESSION: Negative abdominal radiographs.  No acute cardiopulmonary disease.   Electronically Signed   By: Logan Bores   On: 06/07/2013 11:48   Dg Abd Acute W/chest  06/04/2013   CLINICAL DATA:  Abdominal pain and vomiting.  EXAM: ACUTE ABDOMEN SERIES (ABDOMEN 2 VIEW & CHEST 1 VIEW)  COMPARISON:  05/30/2013  FINDINGS: The lungs appear clear.  Cardiac and mediastinal contours normal.  No pleural effusion identified.  Cholecystectomy clip noted. Formed stool noted in the colon. No dilated bowel. Bowel gas pattern is unremarkable.  IMPRESSION: 1. No significant radiographic abnormality.   Electronically Signed   By: Sherryl Barters M.D.   On: 06/04/2013 16:00   Dg Abd Acute W/chest  05/30/2013   CLINICAL DATA:  Upper abdominal pain for the past few weeks recently increasing. Nausea and vomiting. Diaphoresis.  EXAM: ACUTE ABDOMEN SERIES (ABDOMEN 2 VIEW & CHEST 1 VIEW)  COMPARISON:  No priors.  FINDINGS: Lung volumes are normal. No consolidative airspace disease. No pleural effusions. No pneumothorax. No pulmonary nodule or mass noted. Pulmonary vasculature and the cardiomediastinal silhouette are within normal limits.  Gas and stool are seen scattered throughout the colon extending to the level of the distal rectum. No pathologic distension of small bowel is noted. No gross evidence of pneumoperitoneum. Surgical clips project over the right upper quadrant of the  abdomen, likely from prior cholecystectomy.  IMPRESSION: 1.  Nonobstructive bowel gas pattern. 2. No pneumoperitoneum. 3. No radiographic evidence of acute cardiopulmonary disease.   Electronically Signed   By: Vinnie Langton M.D.   On: 05/30/2013 15:53    Microbiology: No results found for this or any previous visit (from the past 240 hour(s)).  Labs: Basic Metabolic Panel:  Recent Labs Lab 06/06/13 0616 06/07/13 0600 06/08/13 0326 06/09/13 0450 06/10/13 0455  NA 138 139 139 136* 135*  K 4.6 4.1 3.8 4.3 4.3  CL 104 102 99 96 98  CO2 24 24 26 24 25   GLUCOSE 161* 146* 110* 163* 117*  BUN 10 8 7 8 9   CREATININE 1.21 1.21 1.14 1.08 1.14  CALCIUM 8.9 9.0 8.8 8.7 8.7  MG 2.2  --   --   --   --    Liver Function Tests:  Recent Labs Lab 06/08/13 0326  AST 12  ALT 13  ALKPHOS 70  BILITOT 0.4  PROT 6.2  ALBUMIN 3.3*   No results found for this basename: LIPASE, AMYLASE,  in the last 168 hours No results found for this basename: AMMONIA,  in the last 168 hours CBC:  Recent Labs Lab 06/06/13 0616 06/08/13 0326 06/09/13 0450 06/10/13 0455  WBC 7.8 7.4 6.8 8.2  HGB 12.5* 12.3* 12.8* 12.3*  HCT 37.9* 37.5* 37.7* 37.0*  MCV 81.2 81.2 81.1 80.6  PLT 209 217 217 232   Cardiac Enzymes: No results found for this basename: CKTOTAL, CKMB, CKMBINDEX, TROPONINI,  in the last 168 hours BNP: BNP (last 3 results) No results found for this basename: PROBNP,  in the last 8760 hours CBG:  Recent Labs Lab 06/11/13 1654 06/11/13 2006 06/12/13 0006 06/12/13 0437 06/12/13 0736  GLUCAP 169* 208* 290* 228* 238*    Time coordinating discharge: Over 30 minutes

## 2013-06-15 LAB — GLUCOSE, CAPILLARY
GLUCOSE-CAPILLARY: 235 mg/dL — AB (ref 70–99)
Glucose-Capillary: 194 mg/dL — ABNORMAL HIGH (ref 70–99)

## 2013-08-16 ENCOUNTER — Inpatient Hospital Stay (HOSPITAL_COMMUNITY)
Admission: EM | Admit: 2013-08-16 | Discharge: 2013-08-19 | DRG: 074 | Disposition: A | Discharge status: Home / Self Care | Payer: BC Managed Care – PPO | Attending: Internal Medicine | Admitting: Internal Medicine

## 2013-08-16 ENCOUNTER — Encounter (HOSPITAL_COMMUNITY): Payer: Self-pay | Admitting: Emergency Medicine

## 2013-08-16 ENCOUNTER — Other Ambulatory Visit: Payer: Self-pay

## 2013-08-16 DIAGNOSIS — E1142 Type 2 diabetes mellitus with diabetic polyneuropathy: Secondary | ICD-10-CM | POA: Diagnosis present

## 2013-08-16 DIAGNOSIS — I10 Essential (primary) hypertension: Secondary | ICD-10-CM

## 2013-08-16 DIAGNOSIS — R112 Nausea with vomiting, unspecified: Secondary | ICD-10-CM

## 2013-08-16 DIAGNOSIS — E1149 Type 2 diabetes mellitus with other diabetic neurological complication: Principal | ICD-10-CM | POA: Diagnosis present

## 2013-08-16 DIAGNOSIS — IMO0001 Reserved for inherently not codable concepts without codable children: Secondary | ICD-10-CM

## 2013-08-16 DIAGNOSIS — D72829 Elevated white blood cell count, unspecified: Secondary | ICD-10-CM

## 2013-08-16 DIAGNOSIS — Z6835 Body mass index (BMI) 35.0-35.9, adult: Secondary | ICD-10-CM

## 2013-08-16 DIAGNOSIS — R1110 Vomiting, unspecified: Secondary | ICD-10-CM

## 2013-08-16 DIAGNOSIS — E860 Dehydration: Secondary | ICD-10-CM

## 2013-08-16 DIAGNOSIS — R1115 Cyclical vomiting syndrome unrelated to migraine: Secondary | ICD-10-CM

## 2013-08-16 DIAGNOSIS — E876 Hypokalemia: Secondary | ICD-10-CM

## 2013-08-16 DIAGNOSIS — Z79899 Other long term (current) drug therapy: Secondary | ICD-10-CM

## 2013-08-16 DIAGNOSIS — Z9089 Acquired absence of other organs: Secondary | ICD-10-CM

## 2013-08-16 DIAGNOSIS — E109 Type 1 diabetes mellitus without complications: Secondary | ICD-10-CM

## 2013-08-16 DIAGNOSIS — E119 Type 2 diabetes mellitus without complications: Secondary | ICD-10-CM

## 2013-08-16 DIAGNOSIS — K3184 Gastroparesis: Secondary | ICD-10-CM

## 2013-08-16 DIAGNOSIS — Z794 Long term (current) use of insulin: Secondary | ICD-10-CM

## 2013-08-16 LAB — COMPREHENSIVE METABOLIC PANEL
ALT: 19 U/L (ref 0–53)
AST: 18 U/L (ref 0–37)
Albumin: 4.3 g/dL (ref 3.5–5.2)
Alkaline Phosphatase: 91 U/L (ref 39–117)
Anion gap: 18 — ABNORMAL HIGH (ref 5–15)
BUN: 13 mg/dL (ref 6–23)
CALCIUM: 9.6 mg/dL (ref 8.4–10.5)
CO2: 23 meq/L (ref 19–32)
CREATININE: 1.12 mg/dL (ref 0.50–1.35)
Chloride: 97 mEq/L (ref 96–112)
GFR, EST NON AFRICAN AMERICAN: 83 mL/min — AB (ref >90)
GLUCOSE: 290 mg/dL — AB (ref 70–99)
Potassium: 4.1 mEq/L (ref 3.7–5.3)
Sodium: 138 mEq/L (ref 137–147)
Total Bilirubin: 0.4 mg/dL (ref 0.3–1.2)
Total Protein: 7.9 g/dL (ref 6.0–8.3)

## 2013-08-16 LAB — URINALYSIS, ROUTINE W REFLEX MICROSCOPIC
Bilirubin Urine: NEGATIVE
Glucose, UA: >1000 mg/dL — AB
Ketones, ur: 15 mg/dL — AB
Leukocytes, UA: NEGATIVE
Nitrite: NEGATIVE
PROTEIN: NEGATIVE mg/dL
Specific Gravity, Urine: 1.025 (ref 1.005–1.030)
UROBILINOGEN UA: 1 mg/dL (ref 0.0–1.0)
pH: 5.5 (ref 5.0–8.0)

## 2013-08-16 LAB — CBC WITH DIFFERENTIAL/PLATELET
Basophils Absolute: 0 10*3/uL (ref 0.0–0.1)
Basophils Relative: 0 % (ref 0–1)
EOS PCT: 0 % (ref 0–5)
Eosinophils Absolute: 0 10*3/uL (ref 0.0–0.7)
HCT: 38.5 % — ABNORMAL LOW (ref 39.0–52.0)
Hemoglobin: 13 g/dL (ref 13.0–17.0)
LYMPHS ABS: 0.9 10*3/uL (ref 0.7–4.0)
LYMPHS PCT: 7 % — AB (ref 12–46)
MCH: 26.8 pg (ref 26.0–34.0)
MCHC: 33.8 g/dL (ref 30.0–36.0)
MCV: 79.4 fL (ref 78.0–100.0)
Monocytes Absolute: 0.6 10*3/uL (ref 0.1–1.0)
Monocytes Relative: 4 % (ref 3–12)
Neutro Abs: 11.2 10*3/uL — ABNORMAL HIGH (ref 1.7–7.7)
Neutrophils Relative %: 89 % — ABNORMAL HIGH (ref 43–77)
Platelets: 264 10*3/uL (ref 150–400)
RBC: 4.85 MIL/uL (ref 4.22–5.81)
RDW: 12.7 % (ref 11.5–15.5)
WBC: 12.7 10*3/uL — AB (ref 4.0–10.5)

## 2013-08-16 LAB — LIPASE, BLOOD: LIPASE: 16 U/L (ref 11–59)

## 2013-08-16 LAB — I-STAT TROPONIN, ED: TROPONIN I, POC: 0 ng/mL (ref 0.00–0.08)

## 2013-08-16 LAB — URINE MICROSCOPIC-ADD ON

## 2013-08-16 LAB — CBG MONITORING, ED: Glucose-Capillary: 249 mg/dL — ABNORMAL HIGH (ref 70–99)

## 2013-08-16 LAB — GLUCOSE, CAPILLARY: Glucose-Capillary: 206 mg/dL — ABNORMAL HIGH (ref 70–99)

## 2013-08-16 MED ORDER — HYDROMORPHONE HCL PF 1 MG/ML IJ SOLN
0.5000 mg | Freq: Once | INTRAMUSCULAR | Status: AC
  Administered 2013-08-16: 0.5 mg via INTRAVENOUS
  Filled 2013-08-16: qty 1

## 2013-08-16 MED ORDER — METOCLOPRAMIDE HCL 5 MG/ML IJ SOLN: 10.0000 mg | Freq: Four times a day (QID) | INTRAMUSCULAR | Status: DC

## 2013-08-16 MED ORDER — CLONIDINE HCL 0.1 MG PO TABS
0.1000 mg | ORAL_TABLET | Freq: Two times a day (BID) | ORAL | Status: DC
  Administered 2013-08-17 – 2013-08-19 (×5): 0.1 mg via ORAL
  Filled 2013-08-16 (×7): qty 1

## 2013-08-16 MED ORDER — METOPROLOL SUCCINATE ER 50 MG PO TB24
50.0000 mg | ORAL_TABLET | Freq: Every day | ORAL | Status: DC
Start: 2013-08-16 — End: 2013-08-19
  Administered 2013-08-17 – 2013-08-19 (×3): 50 mg via ORAL
  Filled 2013-08-16 (×4): qty 1

## 2013-08-16 MED ORDER — OXYCODONE HCL 5 MG PO TABS
5.0000 mg | ORAL_TABLET | ORAL | Status: DC | PRN
  Administered 2013-08-19: 5 mg via ORAL
  Filled 2013-08-16: qty 1

## 2013-08-16 MED ORDER — ONDANSETRON HCL 4 MG/2ML IJ SOLN
4.0000 mg | Freq: Once | INTRAMUSCULAR | Status: AC
  Administered 2013-08-16: 4 mg via INTRAVENOUS
  Filled 2013-08-16: qty 2

## 2013-08-16 MED ORDER — HYDROMORPHONE HCL PF 1 MG/ML IJ SOLN
1.0000 mg | Freq: Once | INTRAMUSCULAR | Status: AC
  Administered 2013-08-16: 1 mg via INTRAVENOUS
  Filled 2013-08-16: qty 1

## 2013-08-16 MED ORDER — METOCLOPRAMIDE HCL 5 MG/ML IJ SOLN
10.0000 mg | Freq: Once | INTRAMUSCULAR | Status: AC
  Administered 2013-08-16: 10 mg via INTRAVENOUS
  Filled 2013-08-16: qty 2

## 2013-08-16 MED ORDER — HALOPERIDOL LACTATE 5 MG/ML IJ SOLN
2.0000 mg | Freq: Once | INTRAMUSCULAR | Status: AC
  Administered 2013-08-16: 2 mg via INTRAVENOUS
  Filled 2013-08-16: qty 1

## 2013-08-16 MED ORDER — ACETAMINOPHEN 650 MG RE SUPP: 650.0000 mg | Freq: Four times a day (QID) | RECTAL | Status: DC | PRN

## 2013-08-16 MED ORDER — SODIUM CHLORIDE 0.9 % IV SOLN
INTRAVENOUS | Status: DC
  Administered 2013-08-16 – 2013-08-19 (×5): via INTRAVENOUS

## 2013-08-16 MED ORDER — ACETAMINOPHEN 325 MG PO TABS: 650.0000 mg | ORAL_TABLET | Freq: Four times a day (QID) | ORAL | Status: DC | PRN

## 2013-08-16 MED ORDER — HYDROMORPHONE HCL PF 1 MG/ML IJ SOLN
0.5000 mg | INTRAMUSCULAR | Status: DC | PRN
  Administered 2013-08-16: 0.5 mg via INTRAVENOUS
  Administered 2013-08-17 (×2): 1 mg via INTRAVENOUS
  Administered 2013-08-17 (×2): 0.5 mg via INTRAVENOUS
  Administered 2013-08-18 – 2013-08-19 (×3): 1 mg via INTRAVENOUS
  Filled 2013-08-16 (×7): qty 1

## 2013-08-16 MED ORDER — INSULIN GLARGINE 100 UNIT/ML ~~LOC~~ SOLN
13.0000 [IU] | Freq: Every day | SUBCUTANEOUS | Status: DC
  Administered 2013-08-16 – 2013-08-18 (×3): 13 [IU] via SUBCUTANEOUS
  Filled 2013-08-16 (×4): qty 0.13

## 2013-08-16 MED ORDER — AMLODIPINE BESYLATE 10 MG PO TABS
10.0000 mg | ORAL_TABLET | Freq: Every morning | ORAL | Status: DC
  Administered 2013-08-18 – 2013-08-19 (×2): 10 mg via ORAL
  Filled 2013-08-16 (×3): qty 1

## 2013-08-16 MED ORDER — ENOXAPARIN SODIUM 40 MG/0.4ML ~~LOC~~ SOLN
40.0000 mg | SUBCUTANEOUS | Status: DC
  Administered 2013-08-16: 40 mg via SUBCUTANEOUS
  Filled 2013-08-16: qty 0.4

## 2013-08-16 MED ORDER — METOCLOPRAMIDE HCL 10 MG PO TABS
10.0000 mg | ORAL_TABLET | Freq: Four times a day (QID) | ORAL | Status: DC
  Filled 2013-08-16 (×4): qty 1

## 2013-08-16 MED ORDER — ONDANSETRON HCL 4 MG/2ML IJ SOLN
4.0000 mg | Freq: Once | INTRAMUSCULAR | Status: DC
  Filled 2013-08-16: qty 2

## 2013-08-16 MED ORDER — HYDROMORPHONE HCL PF 1 MG/ML IJ SOLN
0.5000 mg | Freq: Once | INTRAMUSCULAR | Status: DC
  Filled 2013-08-16: qty 1

## 2013-08-16 MED ORDER — ONDANSETRON HCL 4 MG/2ML IJ SOLN
4.0000 mg | Freq: Four times a day (QID) | INTRAMUSCULAR | Status: DC | PRN
  Administered 2013-08-16: 4 mg via INTRAVENOUS
  Filled 2013-08-16: qty 2

## 2013-08-16 MED ORDER — PANTOPRAZOLE SODIUM 40 MG IV SOLR
40.0000 mg | Freq: Two times a day (BID) | INTRAVENOUS | Status: DC
  Administered 2013-08-16 – 2013-08-19 (×6): 40 mg via INTRAVENOUS
  Filled 2013-08-16 (×7): qty 40

## 2013-08-16 MED ORDER — CHLORHEXIDINE GLUCONATE 0.12 % MT SOLN
15.0000 mL | Freq: Two times a day (BID) | OROMUCOSAL | Status: DC
Start: 2013-08-17 — End: 2013-08-19
  Administered 2013-08-17 – 2013-08-19 (×5): 15 mL via OROMUCOSAL
  Filled 2013-08-16 (×8): qty 15

## 2013-08-16 MED ORDER — INSULIN ASPART 100 UNIT/ML ~~LOC~~ SOLN
0.0000 [IU] | SUBCUTANEOUS | Status: DC
  Administered 2013-08-17: 2 [IU] via SUBCUTANEOUS
  Administered 2013-08-17: 1 [IU] via SUBCUTANEOUS
  Administered 2013-08-18 (×3): 2 [IU] via SUBCUTANEOUS
  Administered 2013-08-18: 1 [IU] via SUBCUTANEOUS
  Administered 2013-08-19: 3 [IU] via SUBCUTANEOUS
  Administered 2013-08-19: 1 [IU] via SUBCUTANEOUS

## 2013-08-16 MED ORDER — ALUM & MAG HYDROXIDE-SIMETH 200-200-20 MG/5ML PO SUSP: 30.0000 mL | Freq: Four times a day (QID) | ORAL | Status: DC | PRN

## 2013-08-16 MED ORDER — SODIUM CHLORIDE 0.9 % IV BOLUS (SEPSIS)
1000.0000 mL | INTRAVENOUS | Status: AC
  Administered 2013-08-16: 1000 mL via INTRAVENOUS

## 2013-08-16 MED ORDER — GABAPENTIN 300 MG PO CAPS
300.0000 mg | ORAL_CAPSULE | Freq: Every day | ORAL | Status: DC
  Administered 2013-08-17 – 2013-08-18 (×2): 300 mg via ORAL
  Filled 2013-08-16 (×4): qty 1

## 2013-08-16 MED ORDER — ONDANSETRON HCL 4 MG PO TABS: 4.0000 mg | ORAL_TABLET | Freq: Four times a day (QID) | ORAL | Status: DC | PRN

## 2013-08-16 MED ORDER — SODIUM CHLORIDE 0.9 % IV SOLN: INTRAVENOUS | Status: AC

## 2013-08-16 MED ORDER — SODIUM CHLORIDE 0.9 % IV BOLUS (SEPSIS)
1000.0000 mL | Freq: Once | INTRAVENOUS | Status: AC
  Administered 2013-08-16: 1000 mL via INTRAVENOUS

## 2013-08-16 MED ORDER — ENOXAPARIN SODIUM 80 MG/0.8ML ~~LOC~~ SOLN
65.0000 mg | SUBCUTANEOUS | Status: DC
  Administered 2013-08-17 – 2013-08-18 (×2): 65 mg via SUBCUTANEOUS
  Filled 2013-08-16 (×3): qty 0.8

## 2013-08-16 MED ORDER — SUCRALFATE 1 G PO TABS
1.0000 g | ORAL_TABLET | Freq: Three times a day (TID) | ORAL | Status: DC
  Administered 2013-08-17 – 2013-08-19 (×10): 1 g via ORAL
  Filled 2013-08-16 (×15): qty 1

## 2013-08-16 MED ORDER — FAMOTIDINE IN NACL 20-0.9 MG/50ML-% IV SOLN
20.0000 mg | Freq: Once | INTRAVENOUS | Status: AC
  Administered 2013-08-16: 20 mg via INTRAVENOUS
  Filled 2013-08-16: qty 50

## 2013-08-16 MED ORDER — BIOTENE DRY MOUTH MT LIQD
15.0000 mL | Freq: Two times a day (BID) | OROMUCOSAL | Status: DC
  Administered 2013-08-17 – 2013-08-19 (×5): 15 mL via OROMUCOSAL

## 2013-08-16 NOTE — ED Notes (Signed)
Attempted IV access w/o success. Tim, RN at bedside for IV stick

## 2013-08-16 NOTE — ED Notes (Addendum)
Pt c/o n/v x 3-4 hours. Pt also has abd pain. Pt does has hx of gastritis and DM. Pt has not been drinking ETOH

## 2013-08-16 NOTE — ED Notes (Signed)
Dr. Wilson Singer at bedside to place [name] IV

## 2013-08-16 NOTE — H&P (Signed)
[facility] History and Physical  Fuller Holzworth [id number] DOB: November 18, 1975 DOA: 08/16/2013  Referring physician: EDP PCP: No PCP Per Patient  Specialists:   Chief Complaint:  Severe Nausea and Vomiting  HPI: Duane [name] is a 38 y.o. male with a history of IDDM, Gastroparesis, HTN,  and Diabetic Neuropathy who presents to the ED with complaints of increased N+V and Epigastric ABD pain x 1 day.  He denies any fevers chills or diarrhea.  His symptoms worsened in the afternoon to the point he could not hold down foods or liquids.   He reports having bilous emesis, and denies having any hematemesis.       Review of Systems:  Constitutional: No Weight Loss, No Weight Gain, Night Sweats, Fevers, Chills, Fatigue, or Generalized Weakness HEENT: No Headaches, Difficulty Swallowing,Tooth/Dental Problems,Sore Throat,  No Sneezing, Rhinitis, Ear Ache, Nasal Congestion, or Post Nasal Drip,  Cardio-vascular:  No Chest pain, Orthopnea, PND, Edema in lower extremities, Anasarca, Dizziness, Palpitations  Resp: No Dyspnea, No DOE, No Cough, No Hemoptysis, No Change in Color of Mucus,  No Wheezing.    GI: No Heartburn, Indigestion, +Abdominal Pain, +Nausea, +Vomiting, Diarrhea, Change in Bowel Habits,  Loss of Appetite  GU: No Dysuria, Change in Color of Urine, No Urgency or Frequency.  No flank pain.  Musculoskeletal: No Joint Pain or Swelling.  No Decreased Range of Motion. No Back Pain.  Neurologic: No Syncope, No Seizures, Muscle Weakness, Paresthesia, Vision Disturbance or Loss, No Diplopia, No Vertigo, No Difficulty Walking,  Skin: No Rash or Lesions. Psych: No Change in Mood or Affect. No Depression or Anxiety. No Memory loss. No Confusion or Hallucinations   Past Medical History  Diagnosis Date  . Hypertension   . Diabetes mellitus without complication   . Gastritis   . Gastroparesis     Past Surgical History  Procedure Laterality Date  . Cholecystectomy    . Knee surgery Left    . Flexible sigmoidoscopy N/A 06/11/2013    Procedure: FLEXIBLE SIGMOIDOSCOPY;  Surgeon: [name], MD;  Location: [facility] ENDOSCOPY;  Service: Endoscopy;  Laterality: N/A;  . Esophagogastroduodenoscopy N/A 06/11/2013    Procedure: ESOPHAGOGASTRODUODENOSCOPY (EGD);  Surgeon: [name], MD;  Location: [facility];  Service: Endoscopy;  Laterality: N/A;      Prior to Admission medications   Medication Sig Start Date End Date Taking? Authorizing Provider  amLODipine (NORVASC) 10 MG tablet Take 10 mg by mouth every morning.   Yes Historical Provider, MD  cloNIDine (CATAPRES) 0.1 MG tablet Take 0.1 mg by mouth 2 (two) times daily.   Yes Historical Provider, MD  gabapentin (NEURONTIN) 300 MG capsule Take 300 mg by mouth at bedtime.    Yes Historical Provider, MD  insulin aspart (NOVOLOG) 100 UNIT/ML injection Inject 9-12 Units into the skin 3 (three) times daily before meals.   Yes Historical Provider, MD  insulin glargine (LANTUS) 100 UNIT/ML injection Inject 22-36 Units into the skin at bedtime. 06/12/13  Yes Robbie Lis, MD  metoCLOPramide (REGLAN) 10 MG tablet Take 10 mg by mouth 4 (four) times daily.   Yes Historical Provider, MD  metoprolol succinate (TOPROL-XL) 50 MG 24 hr tablet Take 50 mg by mouth daily. Take with or immediately following a meal.   Yes Historical Provider, MD  pantoprazole (PROTONIX) 40 MG tablet Take 40 mg by mouth daily.   Yes Historical Provider, MD  sucralfate (CARAFATE) 1 G tablet Take 1 g by mouth 4 (four) times daily -  before meals  and at bedtime.   Yes Historical Provider, MD  acetaminophen (TYLENOL) 500 MG tablet Take 500 mg by mouth every 6 (six) hours as needed for mild pain.     Historical Provider, MD     No Known Allergies   Social History:  reports that he has never smoked. He has never used smokeless tobacco. He reports that he drinks alcohol. He reports that he does not use illicit drugs.     No family history on file.     Physical  Exam:  GEN:  Pleasant  Morbidly Obese  38 y.o. African American male examined  and in no acute distress; cooperative with exam Filed Vitals:   08/16/13 1827 08/16/13 1836 08/16/13 1845 08/16/13 1854  BP:      Pulse: 108 93 89 97  Resp: 15 13 12 14   SpO2: 99% 96% 95% 99%   Blood pressure 174/93, pulse 97, temperature 0 F (-17.8 C), resp. rate 14, SpO2 99.00%. PSYCH: SHe is alert and oriented x4; does not appear anxious does not appear depressed; affect is normal HEENT: Normocephalic and Atraumatic, Mucous membranes pink; PERRLA; EOM intact; Fundi:  Benign;  No scleral icterus, Nares: Patent, Oropharynx: Clear, Fair Dentition, Neck:  FROM, no cervical lymphadenopathy nor thyromegaly or carotid bruit; no JVD; Breasts:: Not examined CHEST WALL: No tenderness CHEST: Normal respiration, clear to auscultation bilaterally HEART: Regular rate and rhythm; no murmurs rubs or gallops BACK: No kyphosis or scoliosis; no CVA tenderness ABDOMEN: Positive Bowel Sounds, Obese, soft non-tender; no masses, no organomegaly Rectal Exam: Not done EXTREMITIES: No cyanosis, clubbing or edema; no ulcerations. Genitalia: not examined PULSES: 2+ and symmetric SKIN: Normal hydration no rash or ulceration  CNS:  Alert and Oriented x 4, No Focal Deficits.   Vascular: pulses palpable throughout    Labs on Admission:  Basic Metabolic Panel:  Recent Labs Lab 08/16/13 1444  NA 138  K 4.1  CL 97  CO2 23  GLUCOSE 290*  BUN 13  CREATININE 1.12  CALCIUM 9.6   Liver Function Tests:  Recent Labs Lab 08/16/13 1444  AST 18  ALT 19  ALKPHOS 91  BILITOT 0.4  PROT 7.9  ALBUMIN 4.3    Recent Labs Lab 08/16/13 1444  LIPASE 16   No results found for this basename: AMMONIA,  in the last 168 hours CBC:  Recent Labs Lab 08/16/13 1444  WBC 12.7*  NEUTROABS 11.2*  HGB 13.0  HCT 38.5*  MCV 79.4  PLT 264   Cardiac Enzymes: No results found for this basename: CKTOTAL, CKMB, CKMBINDEX, TROPONINI,   in the last 168 hours  BNP (last 3 results) No results found for this basename: PROBNP,  in the last 8760 hours CBG:  Recent Labs Lab 08/16/13 1312  GLUCAP 249*    Radiological Exams on Admission: No results found.     Assessment/Plan:   38 y.o. male with  Principal Problem:   Intractable nausea and vomiting Active Problems:   Gastroparesis   IDDM (insulin dependent diabetes mellitus)   HTN (hypertension)   Dehydration   Leukocytosis    1.   Intractable Nausea and Vomiting-   Due to Diabetic Gastroparesis,  PRN Anti-emetics, Clear Liquid Diet as Tolerated, IVFs and IV Reglan.      2.   IDDM- Placed on Half Dose Insulin Rx, and SSI coverage PRN.     3.   HTN- continue Amlodipine, Clonidine, and Metoprolol RX, with IV Hydralazine PRN.     4.  Dehydration-   IVFs for rehydration. Monitor BUN/Cr.    5.   Leukocytosis-   STress Rxn  Versus Early Infection, Monitor Trend.     6.  DVT Prophylaxis with Lovenox.        Code Status:  FULL CODE      Family Communication:    No Family Present Disposition Plan:   Inpatient      Time spent:  [place] [facility] Pager [phone]  If 7PM-7AM, please contact night-coverage [email or web address] Password [email or web address] 08/16/2013, 7:50 PM

## 2013-08-16 NOTE — ED Provider Notes (Signed)
CSN: [id number]     Arrival date & time 08/16/13  1245 History   First MD Initiated Contact with Patient 08/16/13 1314     Chief Complaint  Patient presents with  . Emesis  . Abdominal Pain     (Consider location/radiation/quality/duration/timing/severity/associated sxs/prior Treatment) HPI  37yM with n/v and abdominal pain. Symptom onset was about 4 hours prior to arrival. Patient is a past history of diabetic gastroparesis. States that current symptoms feel similar to that. No diarrhea. No fevers or chills. Diffuse abdominal pain, but worse in the upper abdomen. No appreciable exacerbating or relieving factors. No chest pain or shortness of breath. Denies any significant recent alcohol intake. No sick contacts. Tried taking reglan shortly before coming to ED but it didn't help. Reports compliance with insulin and other meds.   Past Medical History  Diagnosis Date  . Hypertension   . Diabetes mellitus without complication   . Gastritis   . Gastroparesis    Past Surgical History  Procedure Laterality Date  . Cholecystectomy    . Knee surgery Left   . Flexible sigmoidoscopy N/A 06/11/2013    Procedure: FLEXIBLE SIGMOIDOSCOPY;  Surgeon: [name], MD;  Location: [facility];  Service: Endoscopy;  Laterality: N/A;  . Esophagogastroduodenoscopy N/A 06/11/2013    Procedure: ESOPHAGOGASTRODUODENOSCOPY (EGD);  Surgeon: [name], MD;  Location: [facility];  Service: Endoscopy;  Laterality: N/A;   No family history on file. History  Substance Use Topics  . Smoking status: Never Smoker   . Smokeless tobacco: Never Used  . Alcohol Use: Yes    Review of Systems  All systems reviewed and negative, other than as noted in HPI.   Allergies  Review of patient's allergies indicates no known allergies.  Home Medications   Prior to Admission medications   Medication Sig Start Date End Date Taking? Authorizing Provider  amLODipine (NORVASC) 10 MG tablet Take 10 mg by mouth  every morning.   Yes Historical Provider, MD  cloNIDine (CATAPRES) 0.1 MG tablet Take 0.1 mg by mouth 2 (two) times daily.   Yes Historical Provider, MD  gabapentin (NEURONTIN) 300 MG capsule Take 300 mg by mouth at bedtime.    Yes Historical Provider, MD  insulin aspart (NOVOLOG) 100 UNIT/ML injection Inject 9-12 Units into the skin 3 (three) times daily before meals.   Yes Historical Provider, MD  insulin glargine (LANTUS) 100 UNIT/ML injection Inject 22-36 Units into the skin at bedtime. 06/12/13  Yes Robbie Lis, MD  metoCLOPramide (REGLAN) 10 MG tablet Take 10 mg by mouth 4 (four) times daily.   Yes Historical Provider, MD  metoprolol succinate (TOPROL-XL) 50 MG 24 hr tablet Take 50 mg by mouth daily. Take with or immediately following a meal.   Yes Historical Provider, MD  pantoprazole (PROTONIX) 40 MG tablet Take 40 mg by mouth daily.   Yes Historical Provider, MD  sucralfate (CARAFATE) 1 G tablet Take 1 g by mouth 4 (four) times daily -  before meals and at bedtime.   Yes Historical Provider, MD  acetaminophen (TYLENOL) 500 MG tablet Take 500 mg by mouth every 6 (six) hours as needed for mild pain.     Historical Provider, MD   BP 180/99  Pulse 108  Resp 20  SpO2 100% Physical Exam  Nursing note and vitals reviewed. Constitutional: He appears well-developed and well-nourished. No distress.  Sitting forward in bed. Retching.   HENT:  Head: Normocephalic and atraumatic.  Eyes: Conjunctivae are normal. Right eye  exhibits no discharge. Left eye exhibits no discharge.  Neck: Neck supple.  Cardiovascular: Regular rhythm and normal heart sounds.  Exam reveals no gallop and no friction rub.   No murmur heard. Mild tachycardia  Pulmonary/Chest: Effort normal and breath sounds normal. No respiratory distress.  Abdominal: Soft. He exhibits no distension. There is tenderness. There is no rebound and no guarding.  Diffuse abdominal tenderness w/o rebound or guarding  Musculoskeletal: He  exhibits no edema and no tenderness.  Neurological: He is alert.  Skin: Skin is warm and dry.  Psychiatric: His behavior is normal. Thought content normal.    ED Course  Procedures (including critical care time)  Angiocath insertion Performed by: Virgel Manifold  Consent: Verbal consent obtained. Risks and benefits: risks, benefits and alternatives were discussed Time out: Immediately prior to procedure a "time out" was called to verify the correct patient, procedure, equipment, support staff and site/side marked as required.  Preparation: Patient was prepped and draped in the usual sterile fashion.  Vein Location: L antecub  Ultrasound Guided  Gauge: 20g  Normal blood return and flush without difficulty Patient tolerance: Patient tolerated the procedure well with no immediate complications.    Labs Review Labs Reviewed  CBC WITH DIFFERENTIAL - Abnormal; Notable for the following:    WBC 12.7 (*)    HCT 38.5 (*)    Neutrophils Relative % 89 (*)    Neutro Abs 11.2 (*)    Lymphocytes Relative 7 (*)    All other components within normal limits  COMPREHENSIVE METABOLIC PANEL - Abnormal; Notable for the following:    Glucose, Bld 290 (*)    GFR calc non Af Amer 83 (*)    Anion gap 18 (*)    All other components within normal limits  CBG MONITORING, ED - Abnormal; Notable for the following:    Glucose-Capillary 249 (*)    All other components within normal limits  LIPASE, BLOOD  URINALYSIS, ROUTINE W REFLEX MICROSCOPIC  I-STAT TROPOININ, ED    Imaging Review No results found.   EKG Interpretation None      MDM   Final diagnoses:  Nausea and vomiting, vomiting of unspecified type    9y male with nausea vomiting and abdominal pain. Symptom onset was less than 12 hours ago. Patient has a past history of gastroparesis. Clinically suspect the same. Has mild diffuse abdominal tenderness, but no peritonitis. Hyperglycemia without metabolic acidosis or anion gap.  Mild leukocytosis, but this is nonspecific. Likely stress demargination. He is afebrile.  UA ending. Electrolytes and renal function fine. Will continue to treat symptoms. Numerous agents/doses given.  If symptoms controlled, I feel he is appropriate for discharge. If not, will admit for intractable n/v.     Virgel Manifold, MD 08/16/13 [id number]

## 2013-08-16 NOTE — ED Notes (Signed)
IV infiltrated.  IV team notified.  Report given to Jess, RN on 5W however will wait to transport per IV team RN until she can attempt to gain IV access.

## 2013-08-16 NOTE — ED Notes (Signed)
Dr. Aline Brochure at bedside to insert [name] IV

## 2013-08-16 NOTE — ED Provider Notes (Signed)
4:28 PM Accepted care from Dr. Wilson Singer.   Will admit as pt still in pain and not tolerating po.   Procedure note: Ultrasound Guided Peripheral IV Ultrasound guided 20 g peripheral 1.88 inch angiocath IV placement performed by me. Indications: Nursing unable to place IV, previous IV infiltrated. Details: The left antecubital fossa and upper arm was evaluated with a multifrequency linear probe. Several patent brachial veins are noted. 1 attempts were made to cannulate a left antecubital vein under realtime US guidance with successful cannulation of the vein and catheter placement. There is return of non-pulsatile dark red blood. The patient tolerated the procedure well without complications.  Clinical Impression 1. Nausea and vomiting, vomiting of unspecified type   2. Intractable vomiting with nausea, vomiting of unspecified type   3. Gastroparesis   4. Dehydration   5. Essential hypertension   6. IDDM (insulin dependent diabetes mellitus)   7. Leukocytosis        Blanchard Kelch, MD 08/17/13 [id number]

## 2013-08-16 NOTE — ED Notes (Signed)
Pt from home reports hx of gastroparesis with N/V x1 day. Pt denies ETOH or foods that may have caused upset. Pt is A&O and in NAD

## 2013-08-16 NOTE — ED Notes (Signed)
Patient denies pain and is resting comfortably.  

## 2013-08-17 ENCOUNTER — Observation Stay (HOSPITAL_COMMUNITY): Payer: BC Managed Care – PPO

## 2013-08-17 LAB — BASIC METABOLIC PANEL
Anion gap: 11 (ref 5–15)
BUN: 11 mg/dL (ref 6–23)
CO2: 28 mEq/L (ref 19–32)
CREATININE: 1.09 mg/dL (ref 0.50–1.35)
Calcium: 8.7 mg/dL (ref 8.4–10.5)
Chloride: 104 mEq/L (ref 96–112)
GFR calc Af Amer: >90 mL/min (ref >90)
GFR calc non Af Amer: 86 mL/min — ABNORMAL LOW (ref >90)
GLUCOSE: 139 mg/dL — AB (ref 70–99)
POTASSIUM: 3.9 meq/L (ref 3.7–5.3)
SODIUM: 143 meq/L (ref 137–147)

## 2013-08-17 LAB — GLUCOSE, CAPILLARY
GLUCOSE-CAPILLARY: 119 mg/dL — AB (ref 70–99)
GLUCOSE-CAPILLARY: 113 mg/dL — AB (ref 70–99)
GLUCOSE-CAPILLARY: 103 mg/dL — AB (ref 70–99)
Glucose-Capillary: 106 mg/dL — ABNORMAL HIGH (ref 70–99)
Glucose-Capillary: 113 mg/dL — ABNORMAL HIGH (ref 70–99)
Glucose-Capillary: 136 mg/dL — ABNORMAL HIGH (ref 70–99)
Glucose-Capillary: 182 mg/dL — ABNORMAL HIGH (ref 70–99)

## 2013-08-17 LAB — CBC WITH DIFFERENTIAL/PLATELET
BASOS ABS: 0 10*3/uL (ref 0.0–0.1)
BASOS PCT: 0 % (ref 0–1)
EOS ABS: 0.2 10*3/uL (ref 0.0–0.7)
EOS PCT: 3 % (ref 0–5)
HCT: 34.6 % — ABNORMAL LOW (ref 39.0–52.0)
Hemoglobin: 11.6 g/dL — ABNORMAL LOW (ref 13.0–17.0)
LYMPHS ABS: 1.8 10*3/uL (ref 0.7–4.0)
Lymphocytes Relative: 26 % (ref 12–46)
MCH: 26.7 pg (ref 26.0–34.0)
MCHC: 33.5 g/dL (ref 30.0–36.0)
MCV: 79.5 fL (ref 78.0–100.0)
Monocytes Absolute: 0.5 10*3/uL (ref 0.1–1.0)
Monocytes Relative: 7 % (ref 3–12)
NEUTROS PCT: 64 % (ref 43–77)
Neutro Abs: 4.4 10*3/uL (ref 1.7–7.7)
PLATELETS: 230 10*3/uL (ref 150–400)
RBC: 4.35 MIL/uL (ref 4.22–5.81)
RDW: 12.7 % (ref 11.5–15.5)
WBC: 6.9 10*3/uL (ref 4.0–10.5)

## 2013-08-17 LAB — LACTIC ACID, PLASMA: LACTIC ACID, VENOUS: 0.7 mmol/L (ref 0.5–2.2)

## 2013-08-17 LAB — MAGNESIUM: Magnesium: 1.9 mg/dL (ref 1.5–2.5)

## 2013-08-17 MED ORDER — PROMETHAZINE HCL 25 MG/ML IJ SOLN
12.5000 mg | Freq: Four times a day (QID) | INTRAMUSCULAR | Status: DC | PRN
  Administered 2013-08-17 (×2): 12.5 mg via INTRAVENOUS
  Filled 2013-08-17 (×2): qty 1

## 2013-08-17 MED ORDER — METOCLOPRAMIDE HCL 5 MG/ML IJ SOLN
10.0000 mg | Freq: Four times a day (QID) | INTRAMUSCULAR | Status: AC
  Administered 2013-08-17 – 2013-08-18 (×7): 10 mg via INTRAVENOUS
  Filled 2013-08-17 (×9): qty 2

## 2013-08-17 MED ORDER — METOCLOPRAMIDE HCL 10 MG PO TABS: 10.0000 mg | ORAL_TABLET | Freq: Three times a day (TID) | ORAL | Status: DC

## 2013-08-17 MED ORDER — SODIUM CHLORIDE 0.9 % IJ SOLN
10.0000 mL | INTRAMUSCULAR | Status: DC | PRN
  Administered 2013-08-18 – 2013-08-19 (×2): 10 mL

## 2013-08-17 NOTE — Progress Notes (Addendum)
[facility] PROGRESS NOTE  Duane [name] [id number] DOB: Jan 27, 1976 DOA: 08/16/2013 PCP: No PCP Per Patient  Assessment/Plan: #1 intractable nausea and vomiting Questionable etiology. Patient states has been diagnosed with gastroparesis for the past and likely etiology. Acute abdominal series has been negative. Will get a gastric emptying study. Will keep patient n.p.o. IV fluids. Supportive care. Continue IV Reglan. Follow.  #2 insulin-dependent diabetes mellitus Continue half home dose insulin. Sliding scale insulin.  #3 hypertension Continue home regimen of Norvasc, clonidine, metoprolol.  #4 dehydration IV fluids.  #5 leukocytosis Likely a reactive leukocytosis. WBC is trending down. Chest x-ray with no acute infiltrate. Urinalysis is negative. Follow.  #6 prophylaxis Protonix for GI prophylaxis. Lovenox for DVT prophylaxis.  Code Status: Full Family Communication: Updated patient no family at bedside. Disposition Plan: Home when medically stable.   Consultants:  None  Procedures:  Acute abdominal series 08/17/2013  Antibiotics:  None  HPI/Subjective: Patient still with some nausea and had episode of emesis early this morning.  Objective: Filed Vitals:   08/17/13 0530  BP: 142/89  Pulse: 91  Temp: 98.2 F (36.8 C)  Resp: 20    Intake/Output Summary (Last 24 hours) at 08/17/13 0942 Last data filed at 08/17/13 [id number]  Gross per 24 hour  Intake    745 ml  Output   1450 ml  Net   -705 ml   Filed Weights   08/16/13 2236  Weight: 129.5 kg (285 lb 7.9 oz)    Exam:   General:  NAD  Cardiovascular: Regular rate and rhythm no murmurs rubs or gallops  Respiratory: Clear to auscultation bilaterally in anterior lung fields  Abdomen: Soft, positive bowel sounds, nondistended, some diffuse tenderness to palpation.  Musculoskeletal: No clubbing cyanosis or edema  Data Reviewed: Basic Metabolic Panel:  Recent Labs Lab 08/16/13 1444  NA 138   K 4.1  CL 97  CO2 23  GLUCOSE 290*  BUN 13  CREATININE 1.12  CALCIUM 9.6   Liver Function Tests:  Recent Labs Lab 08/16/13 1444  AST 18  ALT 19  ALKPHOS 91  BILITOT 0.4  PROT 7.9  ALBUMIN 4.3    Recent Labs Lab 08/16/13 1444  LIPASE 16   No results found for this basename: AMMONIA,  in the last 168 hours CBC:  Recent Labs Lab 08/16/13 1444  WBC 12.7*  NEUTROABS 11.2*  HGB 13.0  HCT 38.5*  MCV 79.4  PLT 264   Cardiac Enzymes: No results found for this basename: CKTOTAL, CKMB, CKMBINDEX, TROPONINI,  in the last 168 hours BNP (last 3 results) No results found for this basename: PROBNP,  in the last 8760 hours CBG:  Recent Labs Lab 08/16/13 1312 08/16/13 2203 08/17/13 0034 08/17/13 0352 08/17/13 0805  GLUCAP 249* 206* 182* 136* 119*    No results found for this or any previous visit (from the past 240 hour(s)).   Studies: Dg Abd Acute W/chest  08/17/2013   CLINICAL DATA:  Nausea, vomiting and abdominal pain.  EXAM: ACUTE ABDOMEN SERIES (ABDOMEN 2 VIEW & CHEST 1 VIEW)  COMPARISON:  Abdominal series and CT on 06/07/2013.  FINDINGS: There is no evidence of pulmonary edema, consolidation, pneumothorax, nodule or pleural fluid. Abdominal films show no evidence of bowel obstruction, ileus or free air. The gallbladder has been removed. No abnormal calcifications are identified. Visualized bony structures are unremarkable.  IMPRESSION: No acute findings.   Electronically Signed   By: Aletta Edouard M.D.   On: 08/17/2013 09:10  Scheduled Meds: . sodium chloride   Intravenous STAT  . amLODipine  10 mg Oral q morning - 10a  . antiseptic oral rinse  15 mL Mouth Rinse q12n4p  . chlorhexidine  15 mL Mouth Rinse BID  . cloNIDine  0.1 mg Oral BID  . enoxaparin (LOVENOX) injection  65 mg Subcutaneous Q24H  . gabapentin  300 mg Oral QHS  .  HYDROmorphone (DILAUDID) injection  0.5 mg Intramuscular Once  . insulin aspart  0-9 Units Subcutaneous 6 times per day  .  insulin glargine  13 Units Subcutaneous QHS  . metoCLOPramide (REGLAN) injection  10 mg Intravenous 4 times per day  . metoprolol succinate  50 mg Oral Daily  . ondansetron  4 mg Intramuscular Once  . pantoprazole (PROTONIX) IV  40 mg Intravenous Q12H  . sucralfate  1 g Oral TID AC & HS   Continuous Infusions: . sodium chloride 100 mL/hr at 08/17/13 0747    Principal Problem:   Intractable nausea and vomiting Active Problems:   Gastroparesis   IDDM (insulin dependent diabetes mellitus)   HTN (hypertension)   Dehydration   Leukocytosis    Time spent: 73 minutes    THOMPSON,DANIEL M.D. [facility] Pager [phone]. If 7PM-7AM, please contact night-coverage at [email or web address], password [facility] 08/17/2013, 9:42 AM  LOS: 1 day

## 2013-08-17 NOTE — Progress Notes (Signed)
Peripherally Inserted Central Catheter/Midline Placement  The IV Nurse has discussed with the patient and/or persons authorized to consent for the patient, the purpose of this procedure and the potential benefits and risks involved with this procedure.  The benefits include less needle sticks, lab draws from the catheter and patient may be discharged home with the catheter.  Risks include, but not limited to, infection, bleeding, blood clot (thrombus formation), and puncture of an artery; nerve damage and irregular heat beat.  Alternatives to this procedure were also discussed.  PICC/Midline Placement Documentation  PICC / Midline Double Lumen 08/17/13 PICC Right Brachial 48 cm 2 cm (Active)  Indication for Insertion or Continuance of Line Prolonged intravenous therapies 08/17/2013 12:32 PM  Exposed Catheter (cm) 2 cm 08/17/2013 12:32 PM  Site Assessment Clean;Dry;Intact 08/17/2013 12:32 PM  Dressing Type Occlusive 08/17/2013 12:32 PM  Dressing Status Clean;Dry;Intact 08/17/2013 12:32 PM  Dressing Change Due 08/19/13 08/17/2013 12:32 PM       [name], Duane Rasmussen 08/17/2013, 12:35 PM

## 2013-08-17 NOTE — Progress Notes (Signed)
Utilization Review Completed.[name] T7/07/2013  

## 2013-08-18 ENCOUNTER — Encounter: Payer: Self-pay | Admitting: Internal Medicine

## 2013-08-18 ENCOUNTER — Observation Stay (HOSPITAL_COMMUNITY): Payer: BC Managed Care – PPO

## 2013-08-18 ENCOUNTER — Encounter (HOSPITAL_COMMUNITY): Payer: Self-pay

## 2013-08-18 DIAGNOSIS — R112 Nausea with vomiting, unspecified: Secondary | ICD-10-CM | POA: Insufficient documentation

## 2013-08-18 DIAGNOSIS — R1110 Vomiting, unspecified: Secondary | ICD-10-CM | POA: Diagnosis present

## 2013-08-18 LAB — GLUCOSE, CAPILLARY
GLUCOSE-CAPILLARY: 103 mg/dL — AB (ref 70–99)
GLUCOSE-CAPILLARY: 171 mg/dL — AB (ref 70–99)
Glucose-Capillary: 114 mg/dL — ABNORMAL HIGH (ref 70–99)
Glucose-Capillary: 131 mg/dL — ABNORMAL HIGH (ref 70–99)
Glucose-Capillary: 163 mg/dL — ABNORMAL HIGH (ref 70–99)
Glucose-Capillary: 165 mg/dL — ABNORMAL HIGH (ref 70–99)

## 2013-08-18 LAB — BASIC METABOLIC PANEL
ANION GAP: 10 (ref 5–15)
BUN: 12 mg/dL (ref 6–23)
CO2: 28 mEq/L (ref 19–32)
Calcium: 8.8 mg/dL (ref 8.4–10.5)
Chloride: 104 mEq/L (ref 96–112)
Creatinine, Ser: 1.13 mg/dL (ref 0.50–1.35)
GFR, EST NON AFRICAN AMERICAN: 82 mL/min — AB (ref >90)
Glucose, Bld: 113 mg/dL — ABNORMAL HIGH (ref 70–99)
POTASSIUM: 3.6 meq/L — AB (ref 3.7–5.3)
SODIUM: 142 meq/L (ref 137–147)

## 2013-08-18 LAB — CBC
HCT: 36.3 % — ABNORMAL LOW (ref 39.0–52.0)
Hemoglobin: 11.9 g/dL — ABNORMAL LOW (ref 13.0–17.0)
MCH: 26.7 pg (ref 26.0–34.0)
MCHC: 32.8 g/dL (ref 30.0–36.0)
MCV: 81.6 fL (ref 78.0–100.0)
PLATELETS: 237 10*3/uL (ref 150–400)
RBC: 4.45 MIL/uL (ref 4.22–5.81)
RDW: 12.8 % (ref 11.5–15.5)
WBC: 5.9 10*3/uL (ref 4.0–10.5)

## 2013-08-18 LAB — MAGNESIUM: MAGNESIUM: 1.9 mg/dL (ref 1.5–2.5)

## 2013-08-18 MED ORDER — METOCLOPRAMIDE HCL 10 MG PO TABS
10.0000 mg | ORAL_TABLET | Freq: Three times a day (TID) | ORAL | Status: DC
  Administered 2013-08-19 (×2): 10 mg via ORAL
  Filled 2013-08-18 (×6): qty 1

## 2013-08-18 MED ORDER — POTASSIUM CHLORIDE CRYS ER 20 MEQ PO TBCR
40.0000 meq | EXTENDED_RELEASE_TABLET | Freq: Once | ORAL | Status: AC
  Administered 2013-08-18: 40 meq via ORAL
  Filled 2013-08-18: qty 2

## 2013-08-18 MED ORDER — TECHNETIUM TC 99M SULFUR COLLOID
2.1000 | Freq: Once | INTRAVENOUS | Status: AC | PRN
  Administered 2013-08-18: 2.1 via ORAL

## 2013-08-18 NOTE — Progress Notes (Signed)
[facility] PROGRESS NOTE  Duane [name] [id number] DOB: 01-10-1976 DOA: 08/16/2013 PCP: No PCP Per Patient  Assessment/Plan: #1 intractable nausea and vomiting/gastroparesis Questionable etiology. Patient states has been diagnosed with gastroparesis for the past and likely etiology. Acute abdominal series has been negative. Gastric emptying study consistent with gastroparesis. Patient with clinical improvement. Will place on clear liquids and advance as tolerated to a car modified diet. Change IV Reglan to oral Reglan in the morning. IV fluids. Supportive care. Follow.  #2 insulin-dependent diabetes mellitus Continue half home dose insulin. Sliding scale insulin. Once tolerating solid diet may increase insulin back to home dose.  #3 hypertension Continue home regimen of Norvasc, clonidine, metoprolol.  #4 dehydration IV fluids.  #5 leukocytosis Likely a reactive leukocytosis. WBC is trending down. Chest x-ray with no acute infiltrate. Urinalysis is negative. Follow.  #6 prophylaxis Protonix for GI prophylaxis. Lovenox for DVT prophylaxis.  Code Status: Full Family Communication: Updated patient no family at bedside. Disposition Plan: Home when medically stable, hopefully tomorrow if symptoms have resolved and tolerating diet.   Consultants:  None  Procedures:  Acute abdominal series 08/17/2013  Gastic emptying study 08/18/13  Antibiotics:  None  HPI/Subjective: Patient states he feeling better. No emesis, no nausea. Patient asking for food.  Objective: Filed Vitals:   08/18/13 1430  BP: 149/93  Pulse: 69  Temp: 98.1 F (36.7 C)  Resp: 14    Intake/Output Summary (Last 24 hours) at 08/18/13 1851 Last data filed at 08/18/13 1400  Gross per 24 hour  Intake 2043.33 ml  Output      0 ml  Net 2043.33 ml   Filed Weights   08/16/13 2236  Weight: 129.5 kg (285 lb 7.9 oz)    Exam:   General:  NAD  Cardiovascular: Regular rate and rhythm no murmurs  rubs or gallops  Respiratory: Clear to auscultation bilaterally in anterior lung fields  Abdomen: Soft, positive bowel sounds, nondistended, some diffuse tenderness to palpation.  Musculoskeletal: No clubbing cyanosis or edema  Data Reviewed: Basic Metabolic Panel:  Recent Labs Lab 08/16/13 1444 08/17/13 1308 08/18/13 0515  NA 138 143 142  K 4.1 3.9 3.6*  CL 97 104 104  CO2 23 28 28   GLUCOSE 290* 139* 113*  BUN 13 11 12   CREATININE 1.12 1.09 1.13  CALCIUM 9.6 8.7 8.8  MG  --  1.9 1.9   Liver Function Tests:  Recent Labs Lab 08/16/13 1444  AST 18  ALT 19  ALKPHOS 91  BILITOT 0.4  PROT 7.9  ALBUMIN 4.3    Recent Labs Lab 08/16/13 1444  LIPASE 16   No results found for this basename: AMMONIA,  in the last 168 hours CBC:  Recent Labs Lab 08/16/13 1444 08/17/13 1308 08/18/13 0515  WBC 12.7* 6.9 5.9  NEUTROABS 11.2* 4.4  --   HGB 13.0 11.6* 11.9*  HCT 38.5* 34.6* 36.3*  MCV 79.4 79.5 81.6  PLT 264 230 237   Cardiac Enzymes: No results found for this basename: CKTOTAL, CKMB, CKMBINDEX, TROPONINI,  in the last 168 hours BNP (last 3 results) No results found for this basename: PROBNP,  in the last 8760 hours CBG:  Recent Labs Lab 08/17/13 1913 08/17/13 2334 08/18/13 0350 08/18/13 0739 08/18/13 1619  GLUCAP 103* 106* 103* 114* 165*    No results found for this or any previous visit (from the past 240 hour(s)).   Studies: Nm Gastric Emptying  08/18/2013   CLINICAL DATA:  Nausea, emesis  EXAM: NUCLEAR  MEDICINE GASTRIC EMPTYING SCAN  TECHNIQUE: After oral ingestion of radiolabeled meal, sequential abdominal images were obtained for 120 minutes. Residual percentage of activity remaining within the stomach was calculated at 60 and 120 minutes.  RADIOPHARMACEUTICALS:  2.1 mCi Technetium 99-m labeled sulfur colloid  COMPARISON:  None.  FINDINGS: Expected location of the stomach in the left upper quadrant. Ingested meal empties the stomach gradually over  the course of the study with 100% retention at 60 min and 100% retention at 120 min (normal retention less than 30% at a 120 min).  IMPRESSION: Abnormal gastric emptying study with abnormal delayed gastric emptying.   Electronically Signed   By: Abelardo Diesel M.D.   On: 08/18/2013 10:36   Dg Abd Acute W/chest  08/17/2013   CLINICAL DATA:  Nausea, vomiting and abdominal pain.  EXAM: ACUTE ABDOMEN SERIES (ABDOMEN 2 VIEW & CHEST 1 VIEW)  COMPARISON:  Abdominal series and CT on 06/07/2013.  FINDINGS: There is no evidence of pulmonary edema, consolidation, pneumothorax, nodule or pleural fluid. Abdominal films show no evidence of bowel obstruction, ileus or free air. The gallbladder has been removed. No abnormal calcifications are identified. Visualized bony structures are unremarkable.  IMPRESSION: No acute findings.   Electronically Signed   By: Aletta Edouard M.D.   On: 08/17/2013 09:10    Scheduled Meds: . amLODipine  10 mg Oral q morning - 10a  . antiseptic oral rinse  15 mL Mouth Rinse q12n4p  . chlorhexidine  15 mL Mouth Rinse BID  . cloNIDine  0.1 mg Oral BID  . enoxaparin (LOVENOX) injection  65 mg Subcutaneous Q24H  . gabapentin  300 mg Oral QHS  .  HYDROmorphone (DILAUDID) injection  0.5 mg Intramuscular Once  . insulin aspart  0-9 Units Subcutaneous 6 times per day  . insulin glargine  13 Units Subcutaneous QHS  . metoCLOPramide (REGLAN) injection  10 mg Intravenous 4 times per day  . metoprolol succinate  50 mg Oral Daily  . ondansetron  4 mg Intramuscular Once  . pantoprazole (PROTONIX) IV  40 mg Intravenous Q12H  . sucralfate  1 g Oral TID AC & HS   Continuous Infusions: . sodium chloride 100 mL/hr at 08/18/13 1850    Principal Problem:   Gastroparesis Active Problems:   Intractable nausea and vomiting   IDDM (insulin dependent diabetes mellitus)   HTN (hypertension)   Dehydration   Leukocytosis   Vomiting    Time spent: 56 minutes    Di Jasmer M.D. [facility] Pager [phone]. If 7PM-7AM, please contact night-coverage at [email or web address], password [facility] 08/18/2013, 6:51 PM  LOS: 2 days

## 2013-08-19 LAB — BASIC METABOLIC PANEL
Anion gap: 10 (ref 5–15)
BUN: 10 mg/dL (ref 6–23)
CALCIUM: 8.9 mg/dL (ref 8.4–10.5)
CO2: 27 meq/L (ref 19–32)
Chloride: 103 mEq/L (ref 96–112)
Creatinine, Ser: 1.06 mg/dL (ref 0.50–1.35)
GFR calc Af Amer: >90 mL/min (ref >90)
GFR calc non Af Amer: 89 mL/min — ABNORMAL LOW (ref >90)
GLUCOSE: 147 mg/dL — AB (ref 70–99)
Potassium: 3.8 mEq/L (ref 3.7–5.3)
SODIUM: 140 meq/L (ref 137–147)

## 2013-08-19 LAB — GLUCOSE, CAPILLARY
Glucose-Capillary: 143 mg/dL — ABNORMAL HIGH (ref 70–99)
Glucose-Capillary: 107 mg/dL — ABNORMAL HIGH (ref 70–99)
Glucose-Capillary: 237 mg/dL — ABNORMAL HIGH (ref 70–99)

## 2013-08-19 MED ORDER — METOCLOPRAMIDE HCL 10 MG PO TABS: 10.0000 mg | ORAL_TABLET | Freq: Four times a day (QID) | ORAL | Status: DC

## 2013-08-19 MED ORDER — TRAMADOL HCL 50 MG PO TABS: 50.0000 mg | ORAL_TABLET | Freq: Four times a day (QID) | ORAL | Status: DC | PRN

## 2013-08-19 NOTE — Progress Notes (Signed)
Pt discharged home in stable condition. Discharge instructions and scripts given. Pt verbalized understanding 

## 2013-08-19 NOTE — Discharge Summary (Signed)
Physician Discharge Summary  Duane [name] MRN: [id number] DOB/AGE: 1975-04-28 38 y.o.  PCP: No PCP Per Patient   Admit date: 08/16/2013 Discharge date: 08/19/2013  Discharge Diagnoses:       exacerbation of chronicGastroparesis Active Problems:   IDDM (insulin dependent diabetes mellitus)   HTN (hypertension)   Intractable nausea and vomiting   Dehydration   Leukocytosis   Vomiting  Follow up recommendations Patient recommended to have small frequent meals Repeat CMP, CBC in one week    Medication List         acetaminophen 500 MG tablet  Commonly known as:  TYLENOL  Take 500 mg by mouth every 6 (six) hours as needed for mild pain.     amLODipine 10 MG tablet  Commonly known as:  NORVASC  Take 10 mg by mouth every morning.     cloNIDine 0.1 MG tablet  Commonly known as:  CATAPRES  Take 0.1 mg by mouth 2 (two) times daily.     gabapentin 300 MG capsule  Commonly known as:  NEURONTIN  Take 300 mg by mouth at bedtime.     insulin aspart 100 UNIT/ML injection  Commonly known as:  novoLOG  Inject 9-12 Units into the skin 3 (three) times daily before meals.     insulin glargine 100 UNIT/ML injection  Commonly known as:  LANTUS  Inject 22-36 Units into the skin at bedtime.     metoCLOPramide 10 MG tablet  Commonly known as:  REGLAN  Take 1 tablet (10 mg total) by mouth 4 (four) times daily.     metoprolol succinate 50 MG 24 hr tablet  Commonly known as:  TOPROL-XL  Take 50 mg by mouth daily. Take with or immediately following a meal.     pantoprazole 40 MG tablet  Commonly known as:  PROTONIX  Take 40 mg by mouth daily.     sucralfate 1 G tablet  Commonly known as:  CARAFATE  Take 1 g by mouth 4 (four) times daily -  before meals and at bedtime.     traMADol 50 MG tablet  Commonly known as:  ULTRAM  Take 1 tablet (50 mg total) by mouth every 6 (six) hours as needed.        Discharge Condition:    Disposition: 01-Home or Self  Care   Consults: *none   Significant Diagnostic Studies: Nm Gastric Emptying  08/18/2013   CLINICAL DATA:  Nausea, emesis  EXAM: NUCLEAR MEDICINE GASTRIC EMPTYING SCAN  TECHNIQUE: After oral ingestion of radiolabeled meal, sequential abdominal images were obtained for 120 minutes. Residual percentage of activity remaining within the stomach was calculated at 60 and 120 minutes.  RADIOPHARMACEUTICALS:  2.1 mCi Technetium 99-m labeled sulfur colloid  COMPARISON:  None.  FINDINGS: Expected location of the stomach in the left upper quadrant. Ingested meal empties the stomach gradually over the course of the study with 100% retention at 60 min and 100% retention at 120 min (normal retention less than 30% at a 120 min).  IMPRESSION: Abnormal gastric emptying study with abnormal delayed gastric emptying.   Electronically Signed   By: Abelardo Diesel M.D.   On: 08/18/2013 10:36   Dg Abd Acute W/chest  08/17/2013   CLINICAL DATA:  Nausea, vomiting and abdominal pain.  EXAM: ACUTE ABDOMEN SERIES (ABDOMEN 2 VIEW & CHEST 1 VIEW)  COMPARISON:  Abdominal series and CT on 06/07/2013.  FINDINGS: There is no evidence of pulmonary edema, consolidation, pneumothorax, nodule or pleural fluid. Abdominal  films show no evidence of bowel obstruction, ileus or free air. The gallbladder has been removed. No abnormal calcifications are identified. Visualized bony structures are unremarkable.  IMPRESSION: No acute findings.   Electronically Signed   By: Aletta Edouard M.D.   On: 08/17/2013 09:10       Microbiology: No results found for this or any previous visit (from the past 240 hour(s)).   Labs: Results for orders placed during the hospital encounter of 08/16/13 (from the past 48 hour(s))  GLUCOSE, CAPILLARY     Status: Abnormal   Collection Time    08/17/13 11:24 AM      Result Value Ref Range   Glucose-Capillary 113 (*) 70 - 99 mg/dL  LACTIC ACID, PLASMA     Status: None   Collection Time    08/17/13  1:08 PM       Result Value Ref Range   Lactic Acid, Venous 0.7  0.5 - 2.2 mmol/L  MAGNESIUM     Status: None   Collection Time    08/17/13  1:08 PM      Result Value Ref Range   Magnesium 1.9  1.5 - 2.5 mg/dL  CBC WITH DIFFERENTIAL     Status: Abnormal   Collection Time    08/17/13  1:08 PM      Result Value Ref Range   WBC 6.9  4.0 - 10.5 K/uL   RBC 4.35  4.22 - 5.81 MIL/uL   Hemoglobin 11.6 (*) 13.0 - 17.0 g/dL   HCT 34.6 (*) 39.0 - 52.0 %   MCV 79.5  78.0 - 100.0 fL   MCH 26.7  26.0 - 34.0 pg   MCHC 33.5  30.0 - 36.0 g/dL   RDW 12.7  11.5 - 15.5 %   Platelets 230  150 - 400 K/uL   Neutrophils Relative % 64  43 - 77 %   Neutro Abs 4.4  1.7 - 7.7 K/uL   Lymphocytes Relative 26  12 - 46 %   Lymphs Abs 1.8  0.7 - 4.0 K/uL   Monocytes Relative 7  3 - 12 %   Monocytes Absolute 0.5  0.1 - 1.0 K/uL   Eosinophils Relative 3  0 - 5 %   Eosinophils Absolute 0.2  0.0 - 0.7 K/uL   Basophils Relative 0  0 - 1 %   Basophils Absolute 0.0  0.0 - 0.1 K/uL  BASIC METABOLIC PANEL     Status: Abnormal   Collection Time    08/17/13  1:08 PM      Result Value Ref Range   Sodium 143  137 - 147 mEq/L   Potassium 3.9  3.7 - 5.3 mEq/L   Chloride 104  96 - 112 mEq/L   CO2 28  19 - 32 mEq/L   Glucose, Bld 139 (*) 70 - 99 mg/dL   BUN 11  6 - 23 mg/dL   Creatinine, Ser 1.09  0.50 - 1.35 mg/dL   Calcium 8.7  8.4 - 10.5 mg/dL   GFR calc non Af Amer 86 (*) >90 mL/min   GFR calc Af Amer >90  >90 mL/min   Comment: (NOTE)     The eGFR has been calculated using the CKD EPI equation.     This calculation has not been validated in all clinical situations.     eGFR's persistently <90 mL/min signify possible Chronic Kidney     Disease.   Anion gap 11  5 - 15  GLUCOSE, CAPILLARY  Status: Abnormal   Collection Time    08/17/13  3:33 PM      Result Value Ref Range   Glucose-Capillary 113 (*) 70 - 99 mg/dL  GLUCOSE, CAPILLARY     Status: Abnormal   Collection Time    08/17/13  7:13 PM      Result Value Ref  Range   Glucose-Capillary 103 (*) 70 - 99 mg/dL  GLUCOSE, CAPILLARY     Status: Abnormal   Collection Time    08/17/13 11:34 PM      Result Value Ref Range   Glucose-Capillary 106 (*) 70 - 99 mg/dL  GLUCOSE, CAPILLARY     Status: Abnormal   Collection Time    08/18/13  3:50 AM      Result Value Ref Range   Glucose-Capillary 103 (*) 70 - 99 mg/dL  BASIC METABOLIC PANEL     Status: Abnormal   Collection Time    08/18/13  5:15 AM      Result Value Ref Range   Sodium 142  137 - 147 mEq/L   Potassium 3.6 (*) 3.7 - 5.3 mEq/L   Chloride 104  96 - 112 mEq/L   CO2 28  19 - 32 mEq/L   Glucose, Bld 113 (*) 70 - 99 mg/dL   BUN 12  6 - 23 mg/dL   Creatinine, Ser 1.13  0.50 - 1.35 mg/dL   Calcium 8.8  8.4 - 10.5 mg/dL   GFR calc non Af Amer 82 (*) >90 mL/min   GFR calc Af Amer >90  >90 mL/min   Comment: (NOTE)     The eGFR has been calculated using the CKD EPI equation.     This calculation has not been validated in all clinical situations.     eGFR's persistently <90 mL/min signify possible Chronic Kidney     Disease.   Anion gap 10  5 - 15  CBC     Status: Abnormal   Collection Time    08/18/13  5:15 AM      Result Value Ref Range   WBC 5.9  4.0 - 10.5 K/uL   RBC 4.45  4.22 - 5.81 MIL/uL   Hemoglobin 11.9 (*) 13.0 - 17.0 g/dL   HCT 36.3 (*) 39.0 - 52.0 %   MCV 81.6  78.0 - 100.0 fL   MCH 26.7  26.0 - 34.0 pg   MCHC 32.8  30.0 - 36.0 g/dL   RDW 12.8  11.5 - 15.5 %   Platelets 237  150 - 400 K/uL  MAGNESIUM     Status: None   Collection Time    08/18/13  5:15 AM      Result Value Ref Range   Magnesium 1.9  1.5 - 2.5 mg/dL  GLUCOSE, CAPILLARY     Status: Abnormal   Collection Time    08/18/13  7:39 AM      Result Value Ref Range   Glucose-Capillary 114 (*) 70 - 99 mg/dL  GLUCOSE, CAPILLARY     Status: Abnormal   Collection Time    08/18/13 12:14 PM      Result Value Ref Range   Glucose-Capillary 131 (*) 70 - 99 mg/dL  GLUCOSE, CAPILLARY     Status: Abnormal   Collection  Time    08/18/13  4:19 PM      Result Value Ref Range   Glucose-Capillary 165 (*) 70 - 99 mg/dL  GLUCOSE, CAPILLARY     Status: Abnormal   Collection  Time    08/18/13  7:44 PM      Result Value Ref Range   Glucose-Capillary 171 (*) 70 - 99 mg/dL  GLUCOSE, CAPILLARY     Status: Abnormal   Collection Time    08/18/13 11:56 PM      Result Value Ref Range   Glucose-Capillary 163 (*) 70 - 99 mg/dL  GLUCOSE, CAPILLARY     Status: Abnormal   Collection Time    08/19/13  4:35 AM      Result Value Ref Range   Glucose-Capillary 143 (*) 70 - 99 mg/dL  BASIC METABOLIC PANEL     Status: Abnormal   Collection Time    08/19/13  4:55 AM      Result Value Ref Range   Sodium 140  137 - 147 mEq/L   Potassium 3.8  3.7 - 5.3 mEq/L   Chloride 103  96 - 112 mEq/L   CO2 27  19 - 32 mEq/L   Glucose, Bld 147 (*) 70 - 99 mg/dL   BUN 10  6 - 23 mg/dL   Creatinine, Ser 1.06  0.50 - 1.35 mg/dL   Calcium 8.9  8.4 - 10.5 mg/dL   GFR calc non Af Amer 89 (*) >90 mL/min   GFR calc Af Amer >90  >90 mL/min   Comment: (NOTE)     The eGFR has been calculated using the CKD EPI equation.     This calculation has not been validated in all clinical situations.     eGFR's persistently <90 mL/min signify possible Chronic Kidney     Disease.   Anion gap 10  5 - 15  GLUCOSE, CAPILLARY     Status: Abnormal   Collection Time    08/19/13  7:31 AM      Result Value Ref Range   Glucose-Capillary 107 (*) 70 - 99 mg/dL     HPI : Duane [name] is a 38 y.o. male with a history of IDDM, Gastroparesis, HTN, and Diabetic Neuropathy who presents to the ED with complaints of increased N+V and Epigastric ABD pain x 1 day. He denies any fevers chills or diarrhea. His symptoms worsened in the afternoon to the point he could not hold down foods or liquids. He reports having bilous emesis, and denies having any hematemesis.   HOSPITAL COURSE:  #1 intractable nausea and vomiting/gastroparesis  Secondary to gastroparesis. Patient  states has been diagnosed with gastroparesis for the past and likely etiology. Acute abdominal series has been negative. Gastric emptying study done on 7/7, consistent with gastroparesis. Patient now shows clinical improvement, tolerating regular diet.  Treated supportively with IV fluids and IV Reglan  transitioned to by mouth Reglan Received Dilaudid for pain during this hospitalization Transitioned to oral tramadol  #2 insulin-dependent diabetes mellitus  Continue home dose insulin.  Patient is back to his home regimen  #3 hypertension  Continue home regimen of Norvasc, clonidine, metoprolol.  #4 dehydration  Encourage to drink IV fluids and hydrate himself #5 leukocytosis  Likely a reactive leukocytosis. WBC normal at 5.9 prior to discharge. Chest x-ray with no acute infiltrate. Urinalysis is negative.        Discharge Exam:  Blood pressure 120/75, pulse 75, temperature 98.5 F (36.9 C), temperature source Oral, resp. rate 16, height 6' 3"  (1.905 m), weight 129.5 kg (285 lb 7.9 oz), SpO2 96.00%.  General: NAD  Cardiovascular: Regular rate and rhythm no murmurs rubs or gallops  Respiratory: Clear to auscultation bilaterally in anterior lung  fields  Abdomen: Soft, positive bowel sounds, nondistended, some diffuse tenderness to palpation.  Musculoskeletal: No clubbing cyanosis or edema        Discharge Instructions   Diet - low sodium heart healthy    Complete by:  As directed      Increase activity slowly    Complete by:  As directed            Follow-up Information   Follow up with pcp. Schedule an appointment as soon as possible for a visit in 1 week.      SignedReyne Dumas 08/19/2013, 11:11 AM

## 2013-08-19 NOTE — Discharge Instructions (Signed)
°  Cassel, Jarecki Male, 38 y.o., September 01, 1975  Hospitalized between the 7/05 07/08 Patient can go back to work on 7/10 -

## 2013-08-27 ENCOUNTER — Encounter (HOSPITAL_COMMUNITY): Payer: Self-pay | Admitting: Emergency Medicine

## 2013-08-27 ENCOUNTER — Inpatient Hospital Stay (HOSPITAL_COMMUNITY)
Admission: EM | Admit: 2013-08-27 | Discharge: 2013-08-29 | DRG: 074 | Disposition: A | Discharge status: Home / Self Care | Payer: BC Managed Care – PPO | Attending: Internal Medicine | Admitting: Internal Medicine

## 2013-08-27 DIAGNOSIS — K3184 Gastroparesis: Secondary | ICD-10-CM | POA: Diagnosis present

## 2013-08-27 DIAGNOSIS — Z794 Long term (current) use of insulin: Secondary | ICD-10-CM

## 2013-08-27 DIAGNOSIS — E1142 Type 2 diabetes mellitus with diabetic polyneuropathy: Secondary | ICD-10-CM | POA: Diagnosis present

## 2013-08-27 DIAGNOSIS — E669 Obesity, unspecified: Secondary | ICD-10-CM | POA: Diagnosis present

## 2013-08-27 DIAGNOSIS — Z6835 Body mass index (BMI) 35.0-35.9, adult: Secondary | ICD-10-CM

## 2013-08-27 DIAGNOSIS — E876 Hypokalemia: Secondary | ICD-10-CM | POA: Diagnosis present

## 2013-08-27 DIAGNOSIS — E1069 Type 1 diabetes mellitus with other specified complication: Secondary | ICD-10-CM | POA: Diagnosis present

## 2013-08-27 DIAGNOSIS — E162 Hypoglycemia, unspecified: Secondary | ICD-10-CM

## 2013-08-27 DIAGNOSIS — E109 Type 1 diabetes mellitus without complications: Secondary | ICD-10-CM

## 2013-08-27 DIAGNOSIS — E0849 Diabetes mellitus due to underlying condition with other diabetic neurological complication: Secondary | ICD-10-CM

## 2013-08-27 DIAGNOSIS — R1111 Vomiting without nausea: Secondary | ICD-10-CM

## 2013-08-27 DIAGNOSIS — IMO0001 Reserved for inherently not codable concepts without codable children: Secondary | ICD-10-CM

## 2013-08-27 DIAGNOSIS — E1049 Type 1 diabetes mellitus with other diabetic neurological complication: Principal | ICD-10-CM | POA: Diagnosis present

## 2013-08-27 DIAGNOSIS — E860 Dehydration: Secondary | ICD-10-CM

## 2013-08-27 DIAGNOSIS — E119 Type 2 diabetes mellitus without complications: Secondary | ICD-10-CM

## 2013-08-27 DIAGNOSIS — E1140 Type 2 diabetes mellitus with diabetic neuropathy, unspecified: Secondary | ICD-10-CM | POA: Diagnosis present

## 2013-08-27 DIAGNOSIS — I10 Essential (primary) hypertension: Secondary | ICD-10-CM | POA: Diagnosis present

## 2013-08-27 DIAGNOSIS — E1143 Type 2 diabetes mellitus with diabetic autonomic (poly)neuropathy: Secondary | ICD-10-CM

## 2013-08-27 DIAGNOSIS — R112 Nausea with vomiting, unspecified: Secondary | ICD-10-CM | POA: Diagnosis present

## 2013-08-27 LAB — HEMOGLOBIN A1C
HEMOGLOBIN A1C: 9.2 % — AB (ref <5.7)
MEAN PLASMA GLUCOSE: 217 mg/dL — AB (ref <117)

## 2013-08-27 LAB — URINE MICROSCOPIC-ADD ON

## 2013-08-27 LAB — COMPREHENSIVE METABOLIC PANEL
ALBUMIN: 4 g/dL (ref 3.5–5.2)
ALT: 20 U/L (ref 0–53)
ANION GAP: 14 (ref 5–15)
AST: 15 U/L (ref 0–37)
Alkaline Phosphatase: 84 U/L (ref 39–117)
BILIRUBIN TOTAL: 0.4 mg/dL (ref 0.3–1.2)
BUN: 13 mg/dL (ref 6–23)
CALCIUM: 9.1 mg/dL (ref 8.4–10.5)
CO2: 25 mEq/L (ref 19–32)
CREATININE: 1.02 mg/dL (ref 0.50–1.35)
Chloride: 100 mEq/L (ref 96–112)
GFR calc Af Amer: >90 mL/min (ref >90)
Glucose, Bld: 231 mg/dL — ABNORMAL HIGH (ref 70–99)
Potassium: 4.3 mEq/L (ref 3.7–5.3)
Sodium: 139 mEq/L (ref 137–147)
Total Protein: 7.1 g/dL (ref 6.0–8.3)

## 2013-08-27 LAB — CBC WITH DIFFERENTIAL/PLATELET
Basophils Absolute: 0 10*3/uL (ref 0.0–0.1)
Basophils Relative: 0 % (ref 0–1)
Eosinophils Absolute: 0 10*3/uL (ref 0.0–0.7)
Eosinophils Relative: 0 % (ref 0–5)
HEMATOCRIT: 36.5 % — AB (ref 39.0–52.0)
HEMOGLOBIN: 12.4 g/dL — AB (ref 13.0–17.0)
Lymphocytes Relative: 6 % — ABNORMAL LOW (ref 12–46)
Lymphs Abs: 0.5 10*3/uL — ABNORMAL LOW (ref 0.7–4.0)
MCH: 26.9 pg (ref 26.0–34.0)
MCHC: 34 g/dL (ref 30.0–36.0)
MCV: 79.2 fL (ref 78.0–100.0)
MONO ABS: 0.2 10*3/uL (ref 0.1–1.0)
MONOS PCT: 3 % (ref 3–12)
Neutro Abs: 8 10*3/uL — ABNORMAL HIGH (ref 1.7–7.7)
Neutrophils Relative %: 91 % — ABNORMAL HIGH (ref 43–77)
Platelets: 283 10*3/uL (ref 150–400)
RBC: 4.61 MIL/uL (ref 4.22–5.81)
RDW: 13.1 % (ref 11.5–15.5)
WBC: 8.7 10*3/uL (ref 4.0–10.5)

## 2013-08-27 LAB — URINALYSIS, ROUTINE W REFLEX MICROSCOPIC
Bilirubin Urine: NEGATIVE
Ketones, ur: 15 mg/dL — AB
LEUKOCYTES UA: NEGATIVE
NITRITE: NEGATIVE
PH: 6 (ref 5.0–8.0)
PROTEIN: 30 mg/dL — AB
Specific Gravity, Urine: 1.027 (ref 1.005–1.030)
Urobilinogen, UA: 0.2 mg/dL (ref 0.0–1.0)

## 2013-08-27 LAB — GLUCOSE, CAPILLARY
GLUCOSE-CAPILLARY: 93 mg/dL (ref 70–99)
Glucose-Capillary: 140 mg/dL — ABNORMAL HIGH (ref 70–99)
Glucose-Capillary: 153 mg/dL — ABNORMAL HIGH (ref 70–99)
Glucose-Capillary: 90 mg/dL (ref 70–99)

## 2013-08-27 LAB — LIPASE, BLOOD: LIPASE: 15 U/L (ref 11–59)

## 2013-08-27 LAB — CBG MONITORING, ED: GLUCOSE-CAPILLARY: 263 mg/dL — AB (ref 70–99)

## 2013-08-27 MED ORDER — AMLODIPINE BESYLATE 10 MG PO TABS
10.0000 mg | ORAL_TABLET | Freq: Every morning | ORAL | Status: DC
  Administered 2013-08-28 – 2013-08-29 (×2): 10 mg via ORAL
  Filled 2013-08-27 (×2): qty 1

## 2013-08-27 MED ORDER — ENOXAPARIN SODIUM 80 MG/0.8ML ~~LOC~~ SOLN
0.5000 mg/kg | SUBCUTANEOUS | Status: DC
  Administered 2013-08-27 – 2013-08-28 (×2): 65 mg via SUBCUTANEOUS
  Filled 2013-08-27 (×3): qty 0.8

## 2013-08-27 MED ORDER — SUCRALFATE 1 G PO TABS
1.0000 g | ORAL_TABLET | Freq: Three times a day (TID) | ORAL | Status: DC
  Administered 2013-08-27 – 2013-08-29 (×7): 1 g via ORAL
  Filled 2013-08-27 (×11): qty 1

## 2013-08-27 MED ORDER — METOCLOPRAMIDE HCL 5 MG/ML IJ SOLN
10.0000 mg | Freq: Four times a day (QID) | INTRAMUSCULAR | Status: DC
  Administered 2013-08-27 – 2013-08-29 (×8): 10 mg via INTRAVENOUS
  Filled 2013-08-27 (×11): qty 2

## 2013-08-27 MED ORDER — LORAZEPAM 2 MG/ML IJ SOLN
1.0000 mg | Freq: Once | INTRAMUSCULAR | Status: AC
  Administered 2013-08-27: 1 mg via INTRAVENOUS
  Filled 2013-08-27: qty 1

## 2013-08-27 MED ORDER — INSULIN GLARGINE 100 UNIT/ML ~~LOC~~ SOLN
30.0000 [IU] | Freq: Every day | SUBCUTANEOUS | Status: DC
  Administered 2013-08-27: 30 [IU] via SUBCUTANEOUS
  Filled 2013-08-27: qty 0.3

## 2013-08-27 MED ORDER — ENOXAPARIN SODIUM 40 MG/0.4ML ~~LOC~~ SOLN: 40.0000 mg | SUBCUTANEOUS | Status: DC

## 2013-08-27 MED ORDER — ACETAMINOPHEN 650 MG RE SUPP: 650.0000 mg | Freq: Four times a day (QID) | RECTAL | Status: DC | PRN

## 2013-08-27 MED ORDER — GABAPENTIN 300 MG PO CAPS
300.0000 mg | ORAL_CAPSULE | Freq: Every day | ORAL | Status: DC
  Administered 2013-08-27 – 2013-08-28 (×2): 300 mg via ORAL
  Filled 2013-08-27 (×3): qty 1

## 2013-08-27 MED ORDER — FAMOTIDINE IN NACL 20-0.9 MG/50ML-% IV SOLN
20.0000 mg | Freq: Two times a day (BID) | INTRAVENOUS | Status: DC
  Administered 2013-08-27 – 2013-08-29 (×4): 20 mg via INTRAVENOUS
  Filled 2013-08-27 (×6): qty 50

## 2013-08-27 MED ORDER — PANTOPRAZOLE SODIUM 40 MG PO TBEC
40.0000 mg | DELAYED_RELEASE_TABLET | Freq: Every morning | ORAL | Status: DC
  Administered 2013-08-28 – 2013-08-29 (×2): 40 mg via ORAL
  Filled 2013-08-27 (×2): qty 1

## 2013-08-27 MED ORDER — DIPHENHYDRAMINE HCL 50 MG/ML IJ SOLN
25.0000 mg | Freq: Once | INTRAMUSCULAR | Status: AC
  Administered 2013-08-27: 25 mg via INTRAVENOUS
  Filled 2013-08-27: qty 1

## 2013-08-27 MED ORDER — SODIUM CHLORIDE 0.9 % IV SOLN
Freq: Once | INTRAVENOUS | Status: AC
  Administered 2013-08-27: 14:00:00 via INTRAVENOUS

## 2013-08-27 MED ORDER — ONDANSETRON HCL 4 MG/2ML IJ SOLN: 4.0000 mg | Freq: Once | INTRAMUSCULAR | Status: DC

## 2013-08-27 MED ORDER — ONDANSETRON HCL 4 MG PO TABS: 4.0000 mg | ORAL_TABLET | Freq: Four times a day (QID) | ORAL | Status: DC | PRN

## 2013-08-27 MED ORDER — SODIUM CHLORIDE 0.9 % IV SOLN
INTRAVENOUS | Status: DC
  Administered 2013-08-27: 1000 mL via INTRAVENOUS
  Administered 2013-08-27: 16:00:00 via INTRAVENOUS
  Administered 2013-08-28: 1000 mL via INTRAVENOUS

## 2013-08-27 MED ORDER — METOPROLOL SUCCINATE ER 50 MG PO TB24
50.0000 mg | ORAL_TABLET | Freq: Every evening | ORAL | Status: DC
Start: 2013-08-27 — End: 2013-08-29
  Administered 2013-08-27 – 2013-08-28 (×2): 50 mg via ORAL
  Filled 2013-08-27 (×3): qty 1

## 2013-08-27 MED ORDER — SODIUM CHLORIDE 0.9 % IV BOLUS (SEPSIS)
1000.0000 mL | Freq: Once | INTRAVENOUS | Status: AC
  Administered 2013-08-27: 1000 mL via INTRAVENOUS

## 2013-08-27 MED ORDER — INSULIN ASPART 100 UNIT/ML ~~LOC~~ SOLN
0.0000 [IU] | SUBCUTANEOUS | Status: DC
  Administered 2013-08-27: 2 [IU] via SUBCUTANEOUS

## 2013-08-27 MED ORDER — HYDROMORPHONE HCL PF 1 MG/ML IJ SOLN
1.0000 mg | Freq: Once | INTRAMUSCULAR | Status: AC
  Administered 2013-08-27: 1 mg via INTRAVENOUS
  Filled 2013-08-27: qty 1

## 2013-08-27 MED ORDER — ACETAMINOPHEN 325 MG PO TABS: 650.0000 mg | ORAL_TABLET | Freq: Four times a day (QID) | ORAL | Status: DC | PRN

## 2013-08-27 MED ORDER — METOCLOPRAMIDE HCL 5 MG/ML IJ SOLN
10.0000 mg | Freq: Once | INTRAMUSCULAR | Status: AC
  Administered 2013-08-27: 10 mg via INTRAVENOUS
  Filled 2013-08-27: qty 2

## 2013-08-27 MED ORDER — CLONIDINE HCL 0.1 MG PO TABS
0.1000 mg | ORAL_TABLET | Freq: Two times a day (BID) | ORAL | Status: DC
  Administered 2013-08-27 – 2013-08-29 (×4): 0.1 mg via ORAL
  Filled 2013-08-27 (×5): qty 1

## 2013-08-27 MED ORDER — HYDRALAZINE HCL 20 MG/ML IJ SOLN
10.0000 mg | Freq: Four times a day (QID) | INTRAMUSCULAR | Status: DC | PRN
  Filled 2013-08-27: qty 0.5

## 2013-08-27 MED ORDER — SODIUM CHLORIDE 0.9 % IV SOLN
INTRAVENOUS | Status: AC
  Administered 2013-08-27: 16:00:00 via INTRAVENOUS

## 2013-08-27 MED ORDER — CHLORPROMAZINE HCL 25 MG/ML IJ SOLN
12.5000 mg | Freq: Once | INTRAMUSCULAR | Status: AC
  Administered 2013-08-27: 12.5 mg via INTRAVENOUS
  Filled 2013-08-27: qty 0.5

## 2013-08-27 MED ORDER — ONDANSETRON HCL 4 MG/2ML IJ SOLN
4.0000 mg | Freq: Four times a day (QID) | INTRAMUSCULAR | Status: DC | PRN
  Administered 2013-08-27 (×2): 4 mg via INTRAVENOUS
  Filled 2013-08-27 (×2): qty 2

## 2013-08-27 MED ORDER — MORPHINE SULFATE 2 MG/ML IJ SOLN
1.0000 mg | INTRAMUSCULAR | Status: DC | PRN
  Administered 2013-08-27 – 2013-08-28 (×4): 1 mg via INTRAVENOUS
  Filled 2013-08-27 (×4): qty 1

## 2013-08-27 MED ORDER — TRAMADOL HCL 50 MG PO TABS
50.0000 mg | ORAL_TABLET | Freq: Four times a day (QID) | ORAL | Status: DC | PRN
  Administered 2013-08-29: 50 mg via ORAL
  Filled 2013-08-27: qty 1

## 2013-08-27 NOTE — ED Notes (Signed)
Patient doesn't have to urinate, but urinal was given, for him to try when he can

## 2013-08-27 NOTE — ED Notes (Signed)
Per pt, has a history of gastroparesis and DM-vomiting since 12 am-states cbg this am was 186-

## 2013-08-27 NOTE — ED Notes (Signed)
IV team paged.  

## 2013-08-27 NOTE — ED Notes (Signed)
Patient tried to urinate but still is unable to go.

## 2013-08-27 NOTE — ED Provider Notes (Addendum)
CSN: [id number]     Arrival date & time 08/27/13  0757 History   First MD Initiated Contact with Patient 08/27/13 [phone]     Chief Complaint  Patient presents with  . Emesis     (Consider location/radiation/quality/duration/timing/severity/associated sxs/prior Treatment) The history is provided by the patient.  Duane [name] is a 38 y.o. male hx of HTN, DM, gastroparesis here with vomiting. Vomiting numerous times since 12 am. Vomiting yellowish vomit initially then became bilious. Has some epigastric pain with vomiting. Denies fevers. Had several admissions this year, most recent one was a week ago. He had gastric emptying study a week ago that confirmed gastroparesis and was given reglan to go home. Also diabetic and CBG was 180s at home this morning.    Past Medical History  Diagnosis Date  . Hypertension   . Diabetes mellitus without complication   . Gastritis   . Gastroparesis    Past Surgical History  Procedure Laterality Date  . Cholecystectomy    . Knee surgery Left   . Flexible sigmoidoscopy N/A 06/11/2013    Procedure: FLEXIBLE SIGMOIDOSCOPY;  Surgeon: [name], MD;  Location: [facility];  Service: Endoscopy;  Laterality: N/A;  . Esophagogastroduodenoscopy N/A 06/11/2013    Procedure: ESOPHAGOGASTRODUODENOSCOPY (EGD);  Surgeon: [name], MD;  Location: [facility];  Service: Endoscopy;  Laterality: N/A;   No family history on file. History  Substance Use Topics  . Smoking status: Never Smoker   . Smokeless tobacco: Never Used  . Alcohol Use: Yes    Review of Systems  Gastrointestinal: Positive for vomiting.  All other systems reviewed and are negative.     Allergies  Review of patient's allergies indicates no known allergies.  Home Medications   Prior to Admission medications   Medication Sig Start Date End Date Taking? Authorizing Provider  acetaminophen (TYLENOL) 500 MG tablet Take 500 mg by mouth every 6 (six) hours as needed for mild pain.     Yes Historical Provider, MD  amLODipine (NORVASC) 10 MG tablet Take 10 mg by mouth every morning.   Yes Historical Provider, MD  cloNIDine (CATAPRES) 0.1 MG tablet Take 0.1 mg by mouth 2 (two) times daily.   Yes Historical Provider, MD  gabapentin (NEURONTIN) 300 MG capsule Take 300 mg by mouth at bedtime.    Yes Historical Provider, MD  insulin aspart (NOVOLOG) 100 UNIT/ML injection Inject 9-12 Units into the skin 3 (three) times daily before meals. SSI   Yes Historical Provider, MD  insulin glargine (LANTUS) 100 UNIT/ML injection Inject 22-36 Units into the skin at bedtime. 06/12/13  Yes Robbie Lis, MD  metoCLOPramide (REGLAN) 10 MG tablet Take 10 mg by mouth 4 (four) times daily.   Yes Historical Provider, MD  metoprolol succinate (TOPROL-XL) 50 MG 24 hr tablet Take 50 mg by mouth every evening. Take with or immediately following a meal.   Yes Historical Provider, MD  pantoprazole (PROTONIX) 40 MG tablet Take 40 mg by mouth every morning.    Yes Historical Provider, MD  sucralfate (CARAFATE) 1 G tablet Take 1 g by mouth 4 (four) times daily -  before meals and at bedtime.   Yes Historical Provider, MD   BP 148/89  Pulse 112  Temp(Src) 98.8 F (37.1 C) (Oral)  Resp 18  SpO2 94% Physical Exam  Nursing note and vitals reviewed. Constitutional: He is oriented to person, place, and time.  Uncomfortable   HENT:  Head: Normocephalic.  MM slightly dry  Eyes: Pupils are equal, round, and reactive to light.  Neck: Normal range of motion. Neck supple.  Cardiovascular: Normal rate, regular rhythm and normal heart sounds.   Pulmonary/Chest: Effort normal and breath sounds normal. No respiratory distress. He has no wheezes. He has no rales.  Abdominal: Soft. Bowel sounds are normal.  Epigastric tenderness, no guarding.   Musculoskeletal: Normal range of motion. He exhibits no edema and no tenderness.  Neurological: He is alert and oriented to person, place, and time. No cranial nerve deficit.  Coordination normal.  Skin: Skin is dry.  Psychiatric: He has a normal mood and affect. His behavior is normal. Judgment and thought content normal.    ED Course  Procedures (including critical care time) Labs Review Labs Reviewed  CBC WITH DIFFERENTIAL - Abnormal; Notable for the following:    Hemoglobin 12.4 (*)    HCT 36.5 (*)    Neutrophils Relative % 91 (*)    Neutro Abs 8.0 (*)    Lymphocytes Relative 6 (*)    Lymphs Abs 0.5 (*)    All other components within normal limits  COMPREHENSIVE METABOLIC PANEL - Abnormal; Notable for the following:    Glucose, Bld 231 (*)    All other components within normal limits  URINALYSIS, ROUTINE W REFLEX MICROSCOPIC - Abnormal; Notable for the following:    Glucose, UA >1000 (*)    Hgb urine dipstick TRACE (*)    Ketones, ur 15 (*)    Protein, ur 30 (*)    All other components within normal limits  CBG MONITORING, ED - Abnormal; Notable for the following:    Glucose-Capillary 263 (*)    All other components within normal limits  LIPASE, BLOOD  URINE MICROSCOPIC-ADD ON  CBG MONITORING, ED    Imaging Review No results found.   EKG Interpretation None      MDM   Final diagnoses:  None    Duane [name] is a 38 y.o. male hx of gastroparesis here with vomiting. Likely gastroparesis. Will check labs, hydrate and reassess.   12:35 PM Felt better in the ED. No vomiting after meds. Minimal ketones in urine. CBG dec from 263 to 231. I doubt DKA. Tired after meds but patient worked last night. Tachycardia improved. Stable for d/c. Recommend continue reglan prn.   1:08 PM When nurse tried to discharge patient, he felt nauseated. Will admit for observation.    Wandra Arthurs, MD 08/27/13 [place] Samyukta Cura, MD 08/27/13 [phone]

## 2013-08-27 NOTE — H&P (Signed)
[facility] History and Physical  Duane [name] [id number] DOB: Nov 03, 1975 DOA: 08/27/2013  Referring physician: Dr Darl Householder PCP: No PCP Per Patient , reports seeing physica near [employer] [place] but does not remember name  Chief Complaint:  Nausea and vomiting since one day  HPI:  38 year old obese African American male with a history of type 1 diabetes mellitus on insulin, diabetic gastroparesis further confirmed on recent hospitalization with gastric emptying study, uncontrolled hypertension presented to the ED with several episodes of nausea and vomiting starting last evening. Patient was discharged one week back from the hospital after being admitted for diabetic gastroparesis. He reports feeling fine after being discharged home until yesterday evening when he started having nausea with almost 20 episodes of vomiting overnight and onto he came to the ED. He reports vomiting primarily consisting of food particles and liquid content. Denies any blood. He reports having epigastric pain. He denies any diarrhea . He reports symptoms to be similar to his gastroparesis. He has been admitted several times over past few months after he relocated here from [place] with similar symptoms.  Patient denies headache, dizziness, fever, chills, chest pain, palpitations, SOB,  bowel or urinary symptoms. Denies change in weight .  Course in the ED Patient was afebrile was mildly tachycardic. Blood pressure was elevated. Blood work showing normal CBC and chemistry. Blood glucose was elevated to 230s. Patient given 10 mg IV and Reglan and 2 L IV normal saline bolus along with 1 mg IV Dilaudid. His symptoms started to improve but again became very nauseous and was given a dose of Thorazine without much improvement. Hospitalists called to admit on observation.   Review of Systems:  Constitutional: Denies fever, chills, diaphoresis, appetite change and fatigue.  HEENT: Denies congestion, sore throat,  mouth  sores, trouble swallowing, neck pain,  Respiratory: Denies SOB, DOE, cough, chest tightness,  and wheezing.   Cardiovascular: Denies chest pain, palpitations and leg swelling.  Gastrointestinal: nausea, vomiting, abdominal pain, denies diarrhea, constipation, blood in stool and abdominal distention.  Genitourinary: Denies dysuria, urgency, frequency, hematuria, flank pain and difficulty urinating.  Endocrine: Denies: hot or cold intolerance,  polyuria, polydipsia. Musculoskeletal: Denies myalgias, back pain, joint swelling, arthralgias and gait problem.  Skin: Denies pallor, rash and wound.  Neurological: Denies dizziness, seizures, syncope, weakness, light-headedness, numbness and headaches.     Past Medical History  Diagnosis Date  . Hypertension   . Diabetes mellitus without complication   . Gastritis   . Gastroparesis    Past Surgical History  Procedure Laterality Date  . Cholecystectomy    . Knee surgery Left   . Flexible sigmoidoscopy N/A 06/11/2013    Procedure: FLEXIBLE SIGMOIDOSCOPY;  Surgeon: [name], MD;  Location: [facility];  Service: Endoscopy;  Laterality: N/A;  . Esophagogastroduodenoscopy N/A 06/11/2013    Procedure: ESOPHAGOGASTRODUODENOSCOPY (EGD);  Surgeon: [name], MD;  Location: [facility];  Service: Endoscopy;  Laterality: N/A;   Social History:  reports that he has never smoked. He has never used smokeless tobacco. He reports that he drinks alcohol. He reports that he does not use illicit drugs.  No Known Allergies  History reviewed. No pertinent family history.  Prior to Admission medications   Medication Sig Start Date End Date Taking? Authorizing Provider  acetaminophen (TYLENOL) 500 MG tablet Take 500 mg by mouth every 6 (six) hours as needed for mild pain.    Yes Historical Provider, MD  amLODipine (NORVASC) 10 MG tablet Take 10 mg by mouth every  morning.   Yes Historical Provider, MD  cloNIDine (CATAPRES) 0.1 MG tablet Take 0.1 mg by  mouth 2 (two) times daily.   Yes Historical Provider, MD  gabapentin (NEURONTIN) 300 MG capsule Take 300 mg by mouth at bedtime.    Yes Historical Provider, MD  insulin aspart (NOVOLOG) 100 UNIT/ML injection Inject 9-12 Units into the skin 3 (three) times daily before meals. SSI   Yes Historical Provider, MD  insulin glargine (LANTUS) 100 UNIT/ML injection Inject 22-36 Units into the skin at bedtime. 06/12/13  Yes Robbie Lis, MD  metoCLOPramide (REGLAN) 10 MG tablet Take 10 mg by mouth 4 (four) times daily.   Yes Historical Provider, MD  metoprolol succinate (TOPROL-XL) 50 MG 24 hr tablet Take 50 mg by mouth every evening. Take with or immediately following a meal.   Yes Historical Provider, MD  pantoprazole (PROTONIX) 40 MG tablet Take 40 mg by mouth every morning.    Yes Historical Provider, MD  sucralfate (CARAFATE) 1 G tablet Take 1 g by mouth 4 (four) times daily -  before meals and at bedtime.   Yes Historical Provider, MD     Physical Exam:  Filed Vitals:   08/27/13 0803 08/27/13 0832 08/27/13 1033 08/27/13 1237  BP: 188/106 161/105 148/89 139/94  Pulse: 107 109 112 92  Temp:  98.8 F (37.1 C)    TempSrc:  Oral    Resp: 18 18 18 20   SpO2: 100% 97% 94% 97%    Constitutional: Vital signs reviewed.  Middle aged obese male in no acute distress HEENT: no pallor, no icterus, dry oral mucosa Cardiovascular: RRR, S1 normal, S2 normal, no MRG Chest: CTAB, no wheezes, rales, or rhonchi Abdominal: Soft. Mild epigastric tenderness,, non-distended, bowel sounds are normal, no organomegaly, or guarding present.  Ext: warm, no edema Neurological: Alert and oriented  Labs on Admission:  Basic Metabolic Panel:  Recent Labs Lab 08/27/13 0948  NA 139  K 4.3  CL 100  CO2 25  GLUCOSE 231*  BUN 13  CREATININE 1.02  CALCIUM 9.1   Liver Function Tests:  Recent Labs Lab 08/27/13 0948  AST 15  ALT 20  ALKPHOS 84  BILITOT 0.4  PROT 7.1  ALBUMIN 4.0    Recent Labs Lab  08/27/13 0948  LIPASE 15   No results found for this basename: AMMONIA,  in the last 168 hours CBC:  Recent Labs Lab 08/27/13 0948  WBC 8.7  NEUTROABS 8.0*  HGB 12.4*  HCT 36.5*  MCV 79.2  PLT 283   Cardiac Enzymes: No results found for this basename: CKTOTAL, CKMB, CKMBINDEX, TROPONINI,  in the last 168 hours BNP: No components found with this basename: POCBNP,  CBG:  Recent Labs Lab 08/27/13 0806  GLUCAP 263*    Radiological Exams on Admission: No results found.    Assessment/Plan  Principal Problem:   Diabetic Gastroparesis Patient presenting with intractable nausea and vomiting likely in the setting of diabetic gastroparesis. Recently had gastric imaging study suggestive of gastroparesis. Labs on admission with a normal chemistry and LFTs. Lipase unremarkable. Abdomen done observation. We'll keep him n.p.o. for now and place him on scheduled IV Reglan, IV hydration , when necessary toradol and  low dose IV morphine when necessary for pain.  -Resume home dose protonix and carafate. Will place on IV Pepcid twice a day.    Active Problems:    HTN (hypertension) Elevated blood pressure on presentation. Resume home blood pressure medication as tolerated. We'll place him  on when necessary hydralazine     Diabetes mellitus type 1   Check A1C. place on Lantus 22 units at bedtime as patient is n.p.o. (22-36 units at bedtime with pre-meal aspart) monitor fsg. continue SSI  Diet:cardiac  DVT prophylaxis: sq lovenox   Code Status: full code Family Communication: None at bedside Disposition Plan: home in am if symptoms improved  Alisha Bacus, [place] [facility] Pager [phone]  Total time spent on admission :50 minutes  If 7PM-7AM, please contact night-coverage [email or web address] Password [email or web address] 08/27/2013, 2:13 PM

## 2013-08-28 DIAGNOSIS — E119 Type 2 diabetes mellitus without complications: Secondary | ICD-10-CM

## 2013-08-28 DIAGNOSIS — E1349 Other specified diabetes mellitus with other diabetic neurological complication: Secondary | ICD-10-CM

## 2013-08-28 DIAGNOSIS — E876 Hypokalemia: Secondary | ICD-10-CM

## 2013-08-28 DIAGNOSIS — E1149 Type 2 diabetes mellitus with other diabetic neurological complication: Secondary | ICD-10-CM

## 2013-08-28 DIAGNOSIS — E162 Hypoglycemia, unspecified: Secondary | ICD-10-CM | POA: Diagnosis present

## 2013-08-28 DIAGNOSIS — E1140 Type 2 diabetes mellitus with diabetic neuropathy, unspecified: Secondary | ICD-10-CM | POA: Diagnosis present

## 2013-08-28 DIAGNOSIS — Z794 Long term (current) use of insulin: Secondary | ICD-10-CM

## 2013-08-28 DIAGNOSIS — R1110 Vomiting, unspecified: Secondary | ICD-10-CM

## 2013-08-28 LAB — GLUCOSE, CAPILLARY
GLUCOSE-CAPILLARY: 100 mg/dL — AB (ref 70–99)
GLUCOSE-CAPILLARY: 78 mg/dL (ref 70–99)
GLUCOSE-CAPILLARY: 145 mg/dL — AB (ref 70–99)
Glucose-Capillary: 59 mg/dL — ABNORMAL LOW (ref 70–99)
Glucose-Capillary: 90 mg/dL (ref 70–99)
Glucose-Capillary: 172 mg/dL — ABNORMAL HIGH (ref 70–99)

## 2013-08-28 LAB — BASIC METABOLIC PANEL
Anion gap: 12 (ref 5–15)
BUN: 9 mg/dL (ref 6–23)
CALCIUM: 8.8 mg/dL (ref 8.4–10.5)
CO2: 25 mEq/L (ref 19–32)
Chloride: 103 mEq/L (ref 96–112)
Creatinine, Ser: 1.06 mg/dL (ref 0.50–1.35)
GFR calc Af Amer: >90 mL/min (ref >90)
GFR, EST NON AFRICAN AMERICAN: 89 mL/min — AB (ref >90)
Glucose, Bld: 116 mg/dL — ABNORMAL HIGH (ref 70–99)
Potassium: 3.4 mEq/L — ABNORMAL LOW (ref 3.7–5.3)
Sodium: 140 mEq/L (ref 137–147)

## 2013-08-28 MED ORDER — BIOTENE DRY MOUTH MT LIQD
15.0000 mL | Freq: Two times a day (BID) | OROMUCOSAL | Status: DC
  Administered 2013-08-28 (×2): 15 mL via OROMUCOSAL

## 2013-08-28 MED ORDER — DEXTROSE 50 % IV SOLN
INTRAVENOUS | Status: AC
  Administered 2013-08-28: 25 mL via INTRAVENOUS
  Filled 2013-08-28: qty 50

## 2013-08-28 MED ORDER — DEXTROSE 50 % IV SOLN
25.0000 mL | Freq: Once | INTRAVENOUS | Status: AC | PRN
  Administered 2013-08-28: 25 mL via INTRAVENOUS

## 2013-08-28 MED ORDER — CHLORHEXIDINE GLUCONATE 0.12 % MT SOLN
15.0000 mL | Freq: Two times a day (BID) | OROMUCOSAL | Status: DC
  Filled 2013-08-28 (×5): qty 15

## 2013-08-28 MED ORDER — POTASSIUM CHLORIDE IN NACL 20-0.9 MEQ/L-% IV SOLN
INTRAVENOUS | Status: DC
  Administered 2013-08-28 – 2013-08-29 (×2): via INTRAVENOUS
  Filled 2013-08-28 (×3): qty 1000

## 2013-08-28 MED ORDER — INSULIN GLARGINE 100 UNIT/ML ~~LOC~~ SOLN
20.0000 [IU] | Freq: Every day | SUBCUTANEOUS | Status: DC
  Administered 2013-08-28: 20 [IU] via SUBCUTANEOUS
  Filled 2013-08-28 (×2): qty 0.2

## 2013-08-28 MED ORDER — INSULIN ASPART 100 UNIT/ML ~~LOC~~ SOLN
0.0000 [IU] | Freq: Three times a day (TID) | SUBCUTANEOUS | Status: DC
  Administered 2013-08-28: 3 [IU] via SUBCUTANEOUS
  Administered 2013-08-29: 2 [IU] via SUBCUTANEOUS
  Administered 2013-08-29: 3 [IU] via SUBCUTANEOUS

## 2013-08-28 MED ORDER — INSULIN ASPART 100 UNIT/ML ~~LOC~~ SOLN: 0.0000 [IU] | Freq: Every day | SUBCUTANEOUS | Status: DC

## 2013-08-28 NOTE — Progress Notes (Signed)
Nutrition Brief Note  Patient identified on the Malnutrition Screening Tool (MST) Report  Wt Readings from Last 15 Encounters:  08/28/13 285 lb (129.275 kg)  08/16/13 285 lb 7.9 oz (129.5 kg)  06/11/13 283 lb 4.7 oz (128.5 kg)  06/11/13 283 lb 4.7 oz (128.5 kg)  05/30/13 290 lb (131.543 kg)  01/08/13 290 lb (131.543 kg)    Body mass index is 35.62 kg/(m^2). Patient meets criteria for Obesity II based on current BMI.   Current diet order is NPO. Labs and medications reviewed.   Patient with one day of nausea/vomiting pta, which pt attributes to a chicken stir fry dish. This is improving during admit and was eager for diet advancement. Denied significant changes in appetite or weight. See gastroparesis education note for further information,  No additional nutrition interventions warranted at this time. If nutrition issues arise, please consult RD.   Atlee Abide MS RD LDN Clinical Dietitian [phone]

## 2013-08-28 NOTE — Plan of Care (Signed)
Problem: Food- and Nutrition-Related Knowledge Deficit (NB-1.1) Goal: Nutrition education Formal process to instruct or train a patient/client in a skill or to impart knowledge to help patients/clients voluntarily manage or modify food choices and eating behavior to maintain or improve health. Outcome: Completed/Met Date Met:  08/28/13 RD discretion for gastroparesis diet education  Pt had been educated previously by RD on 5/012015 regarding gastroparesis diet recommendations. Pt reported difficulty complying with diet d/t work schedule. Pt works night shifts 2-3 days/week and 12 shifts on weekends.   Provided pt with "Gastroparesis Nutrition Therapy" and "Low Fiber Nutrition Therapy" handouts from the Academy of [employer]. Reviewed importance of smaller meals 4-6 times/day, and decreasing intake of high fat and high fiber foods. Pt has been trying to consume healthy snacks at work (yogurt, crackers) however will often times snack on potato chips and candies for "quick energy". Educated pt on benefits of more balanced snacks (low fat cheese and crackers and/or peanut butter and crackers) for sustained energy and overall blood glucose control. Encouraged pt to decrease intake of high fat potato chips as this may exacerbate gastroparesis  Discussed patient benefits of physical activity, pt in agreement to start going to gym after work for blood glucose control and weight maintenance  Pt currently NPO. Nausea/vomiting improved since admit and was eager for diet advancement  Expect fair compliance. Pt seemed willing to implement diet and exercise changes and appreciative of information. Encouraged pt to contact RD for additional questions or concerns.  Atlee Abide MS RD LDN Clinical Dietitian [phone]

## 2013-08-28 NOTE — Progress Notes (Addendum)
Progress Note   Duane [name] [id number] DOB: 1975-10-05 DOA: 08/27/2013 PCP: No PCP Per Patient   Brief Narrative:   Duane [name] is an 38 y.o. male with a PMH of type 1 diabetes, diabetic gastroparesis confirmed by gastric emptying study, uncontrolled hypertension who was admitted 08/27/13 with a 24-hour history of nausea and vomiting thought to be secondary to a flare of his gastroparesis.  Assessment/Plan:   Principal Problem:   Diabetic Gastroparesis with intractable nausea and vomiting  Placed on bowel rest, scheduled IV Reglan, IV fluids, and pain control with Toradol and low-dose IV morphine as needed.  Continue Carafate, PPI , Reglan and Pepcid. Discontinue morphine and advance diet to clear liquids.  We'll asked the dietitian to educate patient on changing his diet to a gastroparesis diet with small, frequent meals to assist with symptom management.  Active Problems:   IDDM (insulin dependent diabetes mellitus) with neurological complications / diabetic neuropathy / hypoglycemia  CBGs 59-140 on moderate scale SS I and 30 units of Lantus each bedtime.  Given hypoglycemic episode, decrease Lantus to 20 units.  Continue Neurontin for diabetic neuropathy.  Hemoglobin A1c 9.2%. Counseled. We'll request diabetes coordinator consultation.    Hypokalemia   Add potassium to IV fluids.    HTN (hypertension)  Continue Norvasc, clonidine, metoprolol, and when necessary hydralazine.    DVT Prophylaxis  Continue Lovenox.  Code Status: Full. Family Communication: No family at the bedside. Disposition Plan: Home when stable.   IV Access:    Peripheral IV   Procedures and diagnostic studies:    None.   Medical Consultants:    None.   Other Consultants:    Dietitian  Diabetes coordinator   Anti-Infectives:    None.  Subjective:   Duane [name] has not had any further vomiting although he still has some bouts of nausea and still has  some abdominal pain from his recent intractable vomiting. No dyspnea.  Objective:    Filed Vitals:   08/27/13 2008 08/27/13 2250 08/28/13 0129 08/28/13 0413  BP: 146/87   127/67  Pulse: 95 102  84  Temp: 99.3 F (37.4 C)   98.5 F (36.9 C)  TempSrc: Oral   Oral  Resp: 16   18  Height:   6\' 3"  (1.905 m)   Weight:   129.275 kg (285 lb)   SpO2: 97%   98%    Intake/Output Summary (Last 24 hours) at 08/28/13 0757 Last data filed at 08/28/13 [id number]  Gross per 24 hour  Intake 1841.25 ml  Output    600 ml  Net 1241.25 ml    Exam: Gen:  NAD, mildly ill-appearing Cardiovascular:  RRR, No M/R/G Respiratory:  Lungs CTAB Gastrointestinal:  Abdomen soft, slightly tender midepigastrium, + BS Extremities:  No C/E/C   Data Reviewed:    Labs: Basic Metabolic Panel:  Recent Labs Lab 08/27/13 0948 08/28/13 0431  NA 139 140  K 4.3 3.4*  CL 100 103  CO2 25 25  GLUCOSE 231* 116*  BUN 13 9  CREATININE 1.02 1.06  CALCIUM 9.1 8.8   GFR Estimated Creatinine Clearance: 139.5 ml/min (by C-G formula based on Cr of 1.06). Liver Function Tests:  Recent Labs Lab 08/27/13 0948  AST 15  ALT 20  ALKPHOS 84  BILITOT 0.4  PROT 7.1  ALBUMIN 4.0    Recent Labs Lab 08/27/13 0948  LIPASE 15   CBC:  Recent Labs Lab 08/27/13 0948  WBC 8.7  NEUTROABS 8.0*  HGB  12.4*  HCT 36.5*  MCV 79.2  PLT 283   CBG:  Recent Labs Lab 08/27/13 1635 08/27/13 2007 08/27/13 2353 08/28/13 0411 08/28/13 0444  GLUCAP 140* 93 90 59* 100*   Hgb A1c:  Recent Labs  08/27/13 1423  HGBA1C 9.2*   Microbiology No results found for this or any previous visit (from the past 240 hour(s)).   Medications:   . amLODipine  10 mg Oral q morning - 10a  . antiseptic oral rinse  15 mL Mouth Rinse q12n4p  . chlorhexidine  15 mL Mouth Rinse BID  . cloNIDine  0.1 mg Oral BID  . enoxaparin (LOVENOX) injection  0.5 mg/kg (Order-Specific) Subcutaneous Q24H  . famotidine (PEPCID) IV  20 mg  Intravenous Q12H  . gabapentin  300 mg Oral QHS  . insulin aspart  0-15 Units Subcutaneous 6 times per day  . insulin glargine  30 Units Subcutaneous QHS  . metoCLOPramide (REGLAN) injection  10 mg Intravenous 4 times per day  . metoprolol succinate  50 mg Oral QPM  . pantoprazole  40 mg Oral q morning - 10a  . sucralfate  1 g Oral TID AC & HS   Continuous Infusions: . sodium chloride 1,000 mL (08/28/13 0609)    Time spent: 35 minutes with > 50% of time discussing current diagnostic test results, clinical impression and plan of care.    LOS: 1 day   Bernardine Langworthy  [facility] Pager [phone]. If unable to reach me by pager, please call my cell phone at [phone].  *Please refer to [email or web address], password [email or web address] to get updated schedule on who will round on this patient, as hospitalists switch teams weekly. If 7PM-7AM, please contact night-coverage at [email or web address], password [email or web address] for any overnight needs.  08/28/2013, 7:57 AM    **Disclaimer: This note was dictated with voice recognition software. Similar sounding words can inadvertently be transcribed and this note may contain transcription errors which may not have been corrected upon publication of note.**  Information printed out and reviewed with the patient/family:     In an effort to keep you and your family informed about your hospital stay, I am providing you with this information sheet. If you or your family have any questions, please do not hesitate to have the nursing staff page me to set up a meeting time.  Also note that the hospitalist doctors typically change on Tuesdays or Wednesdays to a different hospitalist doctor.  Printes Lutsky 08/28/2013 1 (Number of days in the hospital)  Treatment team:  Dr. Jacquelynn Cree, Hospitalist (Internist)   Pertinent labs / studies:  Your hemoglobin A1c is 9.2% which means your average blood glucose has been 217 for the past 3 months. Current recommendations are to maintain  a hemoglobin A1c 7% or less to avoid complications of diabetes.  Principle Diagnosis: Diabetic gastroparesis which is when the nerves that go to your stomach do not work well and cause delayed emptying of the stomach resulting in distention, bloating, nausea and vomiting.   Plan for today: Continue Reglan which stimulates your stomach to improve emptying. We can advance your diet slowly once your symptoms have improved and you no longer require pain medicines.  Anticipated discharge date: 1-2 days, depending on progress.

## 2013-08-28 NOTE — Progress Notes (Signed)
Hypoglycemic Event  CBG: 59 at 0411  Treatment: D50 IV 25 mL  Symptoms: Sweaty, Shaky and Hungry  Follow-up CBG: [id number] CBG Result:100  Possible Reasons for Event: inadequate meal intake, medication regimen  Comments/MD notified:paged Dr Conley Canal at [phone] with no return call or change in orders.     Duane [name]  Remember to initiate Hypoglycemia Order Set & complete

## 2013-08-28 NOTE — Progress Notes (Signed)
Inpatient Diabetes Program Recommendations  AACE/ADA: New Consensus Statement on Inpatient Glycemic Control (2013)  Target Ranges:  Prepandial:   less than 140 mg/dL      Peak postprandial:   less than 180 mg/dL (1-2 hours)      Critically ill patients:  140 - 180 mg/dL   Reason for Visit: Diabetes Consult  Diabetes history: DM1 Outpatient Diabetes medications: Lantus 22-36 units QHS, Novolog 9-12 units tidwc Current orders for Inpatient glycemic control: Lantus 20 units QHS, Novolog moderate tidwc and hs  38 year old male with Type 1 DM presents to ED with nausea and vomiting. Familiar with pt from previous admissions. Pt was discharged 1 week ago after being admitted for gastroparesis. States he did see PCP Sadie Haber) and MD referred him to GI, Opthamology, and Endocrinologist. Checks blood sugars 2-3 times/day. Works 3rd shift and states difficulty with "eating the way I'm supposed to." States he eats a lot of pasta, foods high in CHOs. Skips meals occasionally. Mom states pt eats a lot of fast food cheeseburgers and fries. Discussed how diet impacts blood sugar control and gastroparesis. Pt asking about clinical trials for diabetes and gastroparesis. States he has gained weight. Surprised about this since he vomits frequently. No problems in getting insulin or supplies. Mom comments that pt is not taking care of himself and she is concerned.  Uncontrolled DM with HgbA1C of 9.2% and gastroparesis. Eating CL diet.  Inpatient Diabetes Program Recommendations Insulin - Basal: Reduced Lantus to 20 units QHS d/t hypoglycemia this am. Will likely need to titrate. Pt states he takes 22-36 units QHS at home. Insulin - Meal Coverage: When diet is advanced and pt eating >50% meal, add Novolog 4 units tidwc for meal coverage insulin HgbA1C: 9.2% - uncontrolled Diet: When advanced, CHO mod med  [facility]. Numerous discussions with pt on  importance of glycemic control to prevent long-term complications. Discussed results of HgbA1C. Encouraged pt to f/u with Endo for management of DM. Answered questions.  Will follow while inpatient. Thank you. Lorenda Peck, RD, LDN, CDE Inpatient Diabetes Coordinator [phone]

## 2013-08-28 NOTE — Progress Notes (Signed)
Utilization review completed.  

## 2013-08-29 LAB — GLUCOSE, CAPILLARY
Glucose-Capillary: 154 mg/dL — ABNORMAL HIGH (ref 70–99)
Glucose-Capillary: 142 mg/dL — ABNORMAL HIGH (ref 70–99)

## 2013-08-29 MED ORDER — GABAPENTIN 300 MG PO CAPS: 300.0000 mg | ORAL_CAPSULE | Freq: Every day | ORAL | Status: DC

## 2013-08-29 MED ORDER — PANTOPRAZOLE SODIUM 40 MG PO TBEC: 40.0000 mg | DELAYED_RELEASE_TABLET | Freq: Every morning | ORAL | Status: DC

## 2013-08-29 MED ORDER — AMLODIPINE BESYLATE 10 MG PO TABS: 10.0000 mg | ORAL_TABLET | Freq: Every morning | ORAL | Status: DC

## 2013-08-29 MED ORDER — INSULIN GLARGINE 100 UNIT/ML ~~LOC~~ SOLN: 22.0000 [IU] | Freq: Every day | SUBCUTANEOUS | Status: DC

## 2013-08-29 MED ORDER — INSULIN ASPART 100 UNIT/ML ~~LOC~~ SOLN: 9.0000 [IU] | Freq: Three times a day (TID) | SUBCUTANEOUS | Status: DC

## 2013-08-29 MED ORDER — CLONIDINE HCL 0.1 MG PO TABS: 0.1000 mg | ORAL_TABLET | Freq: Two times a day (BID) | ORAL | Status: DC

## 2013-08-29 NOTE — Discharge Instructions (Signed)
It is VERY IMPORTANT that you follow up with a PCP on a regular basis.  Check your blood glucoses before each meal and at bedtime and maintain a log of your readings.  Bring this log with you when you follow up with your PCP so that he or she can adjust your insulin at your follow up visit.  Gastroparesis  Gastroparesis is also called slowed stomach emptying (delayed gastric emptying). It is a condition in which the stomach takes too long to empty its contents. It often happens in people with diabetes.  CAUSES  Gastroparesis happens when nerves to the stomach are damaged or stop working. When the nerves are damaged, the muscles of the stomach and intestines do not work normally. The movement of food is slowed or stopped. High blood glucose (sugar) causes changes in nerves and can damage the blood vessels that carry oxygen and nutrients to the nerves. RISK FACTORS  Diabetes.  Post-viral syndromes.  Eating disorders (anorexia, bulimia).  Surgery on the stomach or vagus nerve.  Gastroesophageal reflux disease (rarely).  Smooth muscle disorders (amyloidosis, scleroderma).  Metabolic disorders, including hypothyroidism.  Parkinson disease. SYMPTOMS   Heartburn.  Feeling sick to your stomach (nausea).  Vomiting of undigested food.  An early feeling of fullness when eating.  Weight loss.  Abdominal bloating.  Erratic blood glucose levels.  Lack of appetite.  Gastroesophageal reflux.  Spasms of the stomach wall. Complications can include:  Bacterial overgrowth in stomach. Food stays in the stomach and can ferment and cause bacteria to grow.  Weight loss due to difficulty digesting and absorbing nutrients.  Vomiting.  Obstruction in the stomach. Undigested food can harden and cause nausea and vomiting.  Blood glucose fluctuations caused by inconsistent food absorption. DIAGNOSIS  The diagnosis of gastroparesis is confirmed through one or more of the following  tests:  Barium X-rays and scans. These tests look at how long it takes for food to move through the stomach.  Gastric manometry. This test measures electrical and muscular activity in the stomach. A thin tube is passed down the throat into the stomach. The tube contains a wire that takes measurements of the stomach's electrical and muscular activity as it digests liquids and solid food.  Endoscopy. This procedure is done with a long, thin tube called an endoscope. It is passed through the mouth and gently down the esophagus into the stomach. This tube helps the caregiver look at the lining of the stomach to check for any abnormalities.  Ultrasonography. This can rule out gallbladder disease or pancreatitis. This test will outline and define the shape of the gallbladder and pancreas. TREATMENT   Treatments may include:  Exercise.  Medicines to control nausea and vomiting.  Medicines to stimulate stomach muscles.  Changes in what and when you eat.  Having smaller meals more often.  Eating low-fiber forms of high-fiber foods, such as eating cooked vegetables instead of raw vegetables.  Eating low-fat foods.  Consuming liquids, which are easier to digest.  In severe cases, feeding tubes and intravenous (IV) feeding may be needed. It is important to note that in most cases, treatment does not cure gastroparesis. It is usually a lasting (chronic) condition. Treatment helps you manage the underlying condition so that you can be as healthy and comfortable as possible. Other treatments  A gastric neurostimulator has been developed to assist people with gastroparesis. The battery-operated device is surgically implanted. It emits mild electrical pulses to help improve stomach emptying and to control nausea and  vomiting.  The use of botulinum toxin has been shown to improve stomach emptying by decreasing the prolonged contractions of the muscle between the stomach and the small intestine  (pyloric sphincter). The benefits are temporary. SEEK MEDICAL CARE IF:   You have diabetes and you are having problems keeping your blood glucose in goal range.  You are having nausea, vomiting, bloating, or early feelings of fullness with eating.  Your symptoms do not change with a change in diet. Document Released: 01/29/2005 Document Revised: 05/26/2012 Document Reviewed: 07/08/2008 [facility] Patient Information 2015 [place], [place]. This information is not intended to replace advice given to you by your health care provider. Make sure you discuss any questions you have with your health care provider.

## 2013-08-29 NOTE — Progress Notes (Signed)
Patient discharged to home on his own, discharge instructions reviewed with patient who verbalized understanding, no new RX's sent with patient. Patient was walked down to his car by a staff member.

## 2013-08-29 NOTE — Discharge Summary (Signed)
Physician Discharge Summary  Duane [name] [id number] DOB: 1975/08/25 DOA: 08/27/2013  PCP: [name], MD  Admit date: 08/27/2013 Discharge date: 08/29/2013   Recommendations for Outpatient Follow-Up:   1. Recommend close outpatient followup with PCP to address glycemic control.   Discharge Diagnosis:   Principal Problem:   Diabetic gastroparesis with intractable nausea and vomiting Active Problems:    IDDM (insulin dependent diabetes mellitus)    HTN (hypertension)    Hypokalemia    Diabetic neuropathy    Hypoglycemia   Discharge Condition: Improved.  Diet recommendation: Carbohydrate-modified.     History of Present Illness:   Duane [name] is an 38 y.o. male with a PMH of type 1 diabetes, diabetic gastroparesis confirmed by gastric emptying study, uncontrolled hypertension who was admitted 08/27/13 with a 24-hour history of nausea and vomiting thought to be secondary to a flare of his gastroparesis.  Hospital Course by Problem:   Principal Problem:  Diabetic Gastroparesis with intractable nausea and vomiting  Gastric emptying study done 08/18/13 confirming gastroparesis.  Placed on bowel rest, scheduled IV Reglan, IV fluids, and pain control with Toradol and low-dose IV morphine as needed. His Carafate, PPI were continued. Seen by the dietitian who educated the patient on changing his diet to a gastroparesis diet with small, frequent meals to assist with symptom management. Active Problems:  IDDM (insulin dependent diabetes mellitus) with neurological complications / diabetic neuropathy / hypoglycemia  The patient was maintained on a combination of Lantus and sliding scale insulin during his hospital stay. Continue Neurontin for diabetic neuropathy.  Hemoglobin A1c 9.2%. Counseled by the diabetes coordinator. Recommend close outpatient followup. Hypokalemia  Resolved with potassium added to IV fluids. HTN (hypertension)  Continue Norvasc, clonidine,  metoprolol.   Procedures:    None.   Medical Consultants:    None.   Discharge Exam:   Filed Vitals:   08/29/13 0549  BP: 134/82  Pulse: 71  Temp: 97.9 F (36.6 C)  Resp: 16   Filed Vitals:   08/28/13 0413 08/28/13 1518 08/28/13 2109 08/29/13 0549  BP: 127/67 129/78 139/80 134/82  Pulse: 84 78 71 71  Temp: 98.5 F (36.9 C) 98.6 F (37 C) 98.4 F (36.9 C) 97.9 F (36.6 C)  TempSrc: Oral Oral Oral Oral  Resp: 18 17 18 16   Height:      Weight:      SpO2: 98% 100% 99% 99%    Gen:  NAD Cardiovascular:  RRR, No M/R/G Respiratory: Lungs CTAB Gastrointestinal: Abdomen soft, NT/ND with normal active bowel sounds. Extremities: No C/E/C    Discharge Instructions:   Discharge Instructions   Call MD for:  persistant nausea and vomiting    Complete by:  As directed      Call MD for:  severe uncontrolled pain    Complete by:  As directed      Diet Carb Modified    Complete by:  As directed      Discharge instructions    Complete by:  As directed   You were cared for by Dr. Jacquelynn Cree  (a hospitalist) during your hospital stay. If you have any questions about your discharge medications or the care you received while you were in the hospital after you are discharged, you can call the unit and ask to speak with the hospitalist on call if the hospitalist that took care of you is not available. Once you are discharged, your primary care physician will handle any further medical issues. Please note that  NO REFILLS for any discharge medications will be authorized once you are discharged, as it is imperative that you return to your primary care physician (or establish a relationship with a primary care physician if you do not have one) for your aftercare needs so that they can reassess your need for medications and monitor your lab values.  Any outstanding tests can be reviewed by your PCP at your follow up visit.  It is also important to review any medicine changes with your  PCP.  Please bring these d/c instructions with you to your next visit so your physician can review these changes with you.     Increase activity slowly    Complete by:  As directed             Medication List         acetaminophen 500 MG tablet  Commonly known as:  TYLENOL  Take 500 mg by mouth every 6 (six) hours as needed for mild pain.     amLODipine 10 MG tablet  Commonly known as:  NORVASC  Take 1 tablet (10 mg total) by mouth every morning.     cloNIDine 0.1 MG tablet  Commonly known as:  CATAPRES  Take 1 tablet (0.1 mg total) by mouth 2 (two) times daily.     gabapentin 300 MG capsule  Commonly known as:  NEURONTIN  Take 1 capsule (300 mg total) by mouth at bedtime.     insulin aspart 100 UNIT/ML injection  Commonly known as:  novoLOG  Inject 9-12 Units into the skin 3 (three) times daily before meals. SSI     insulin glargine 100 UNIT/ML injection  Commonly known as:  LANTUS  Inject 22-36 Units into the skin at bedtime.     metoCLOPramide 10 MG tablet  Commonly known as:  REGLAN  Take 10 mg by mouth 4 (four) times daily.     metoprolol succinate 50 MG 24 hr tablet  Commonly known as:  TOPROL-XL  Take 50 mg by mouth every evening. Take with or immediately following a meal.     pantoprazole 40 MG tablet  Commonly known as:  PROTONIX  Take 1 tablet (40 mg total) by mouth every morning.     sucralfate 1 G tablet  Commonly known as:  CARAFATE  Take 1 g by mouth 4 (four) times daily -  before meals and at bedtime.           Follow-up Information   Follow up with NNODI, ADAKU, MD. Schedule an appointment as soon as possible for a visit in 1 week. (To followup on your diabetes control.)    Specialty:  Family Medicine   Contact information:   [place] [address] [phone]        The results of significant diagnostics from this hospitalization (including imaging, microbiology, ancillary and laboratory) are listed below for  reference.     Significant Diagnostic Studies:   Radiographs: Nm Gastric Emptying  08/18/2013   CLINICAL DATA:  Nausea, emesis  EXAM: NUCLEAR MEDICINE GASTRIC EMPTYING SCAN  TECHNIQUE: After oral ingestion of radiolabeled meal, sequential abdominal images were obtained for 120 minutes. Residual percentage of activity remaining within the stomach was calculated at 60 and 120 minutes.  RADIOPHARMACEUTICALS:  2.1 mCi Technetium 99-m labeled sulfur colloid  COMPARISON:  None.  FINDINGS: Expected location of the stomach in the left upper quadrant. Ingested meal empties the stomach gradually over the course of the study with 100% retention  at 60 min and 100% retention at 120 min (normal retention less than 30% at a 120 min).  IMPRESSION: Abnormal gastric emptying study with abnormal delayed gastric emptying.   Electronically Signed   By: Abelardo Diesel M.D.   On: 08/18/2013 10:36   Labs:  Basic Metabolic Panel:  Recent Labs Lab 08/27/13 0948 08/28/13 0431  NA 139 140  K 4.3 3.4*  CL 100 103  CO2 25 25  GLUCOSE 231* 116*  BUN 13 9  CREATININE 1.02 1.06  CALCIUM 9.1 8.8   GFR Estimated Creatinine Clearance: 139.5 ml/min (by C-G formula based on Cr of 1.06). Liver Function Tests:  Recent Labs Lab 08/27/13 0948  AST 15  ALT 20  ALKPHOS 84  BILITOT 0.4  PROT 7.1  ALBUMIN 4.0    Recent Labs Lab 08/27/13 0948  LIPASE 15   CBC:  Recent Labs Lab 08/27/13 0948  WBC 8.7  NEUTROABS 8.0*  HGB 12.4*  HCT 36.5*  MCV 79.2  PLT 283   CBG:  Recent Labs Lab 08/28/13 0747 08/28/13 1204 08/28/13 1649 08/28/13 2107 08/29/13 0734  GLUCAP 78 90 172* 145* 154*   Hgb A1c  Recent Labs  08/27/13 1423  HGBA1C 9.2*    Time coordinating discharge: 35 minutes.  Signed:  Kanan Sobek  Pager [phone] [facility] 08/29/2013, 9:57 AM

## 2013-09-18 ENCOUNTER — Encounter: Payer: Self-pay | Admitting: Endocrinology

## 2013-09-18 ENCOUNTER — Ambulatory Visit (INDEPENDENT_AMBULATORY_CARE_PROVIDER_SITE_OTHER): Payer: BC Managed Care – PPO | Admitting: Endocrinology

## 2013-09-18 VITALS — BP 124/83 | HR 86 | Temp 98.3°F | Resp 16 | Ht 76.0 in | Wt 279.4 lb

## 2013-09-18 DIAGNOSIS — E1142 Type 2 diabetes mellitus with diabetic polyneuropathy: Secondary | ICD-10-CM

## 2013-09-18 DIAGNOSIS — E1065 Type 1 diabetes mellitus with hyperglycemia: Secondary | ICD-10-CM

## 2013-09-18 DIAGNOSIS — E1143 Type 2 diabetes mellitus with diabetic autonomic (poly)neuropathy: Secondary | ICD-10-CM

## 2013-09-18 DIAGNOSIS — E1149 Type 2 diabetes mellitus with other diabetic neurological complication: Secondary | ICD-10-CM

## 2013-09-18 DIAGNOSIS — E108 Type 1 diabetes mellitus with unspecified complications: Principal | ICD-10-CM

## 2013-09-18 DIAGNOSIS — Z794 Long term (current) use of insulin: Secondary | ICD-10-CM

## 2013-09-18 DIAGNOSIS — E119 Type 2 diabetes mellitus without complications: Secondary | ICD-10-CM

## 2013-09-18 DIAGNOSIS — K3184 Gastroparesis: Secondary | ICD-10-CM

## 2013-09-18 DIAGNOSIS — IMO0002 Reserved for concepts with insufficient information to code with codable children: Secondary | ICD-10-CM

## 2013-09-18 LAB — GLUCOSE, POCT (MANUAL RESULT ENTRY): POC Glucose: 208 mg/dl — AB (ref 70–99)

## 2013-09-18 NOTE — Progress Notes (Signed)
Patient ID: Duane [name], male   DOB: [birth year], 38 y.o.   MRN: [id number]          Reason for Appointment : Consultation for Type 1 Diabetes  History of Present Illness          Diagnosis: Type 1 diabetes mellitus, date of diagnosis: 04/2003         Previous history:  He was initially diagnosed with significant symptomatology and may do hospital with a glucose of about 1100 Initially sent home on insulin and he thinks he was able to get off insulin for about a year after this Subsequently has been back on insulin Previous records are not available as he was under medical care in [place] Not clear what level of control he has had  INSULIN regimen is described as: Lantus 22-36 hs based on blood sugar, NovoLog with meals, 3-9 units  Recent history: He thinks he has been on NovoLog with his Lantus for the last 2 years or so  Overall he has had poor control and apparently because of gastroparesis and has had recurrent hospitalizations A1c this year has been consistently over 9% He thinks his blood sugars are relatively good in the morning but higher after meals Not clear how often he is checking his blood sugar he did not bring his meter for download Currently he is taking variable doses of Lantus based on his blood sugar at his bedtime Also he is adjusting his NovoLog by checking her sugar 30 minutes after eating and then deciding how much he will take He is working usually night shifts, either midnight until 8 AM or sometimes 12 hours    Glucose monitoring:  done times a day         Glucometer: One Touch Ultra.      Blood Glucose readings from recall as follows: Fasting about 160-180, afterwords about 140, after meals her on 250    Glucose patterns: Unknown        Hypoglycemia: none        Self-care: The diet that the patient has been following is: None  Meals: 2-3 meals per day. Eating and  8 pm; 2 am occasionally and at 8 am     Breakfast is taking eggs or cereal,  breakfastsandwiches. At dinner he will have a starch with meat or vegetables     Exercise: 3 days a week , aerobic and weightlifting for up to one hour in the afternoon         Dietician consultation: Most recent: Years ago.         CDE consultation: None Retinal exam: Most recent: years ago    Diabetes labs:   Lab Results  Component Value Date   HGBA1C 9.2* 08/27/2013   HGBA1C 9.0* 05/31/2013   Lab Results  Component Value Date   CREATININE 1.06 08/28/2013      Medication List       This list is accurate as of: 09/18/13  2:31 PM.  Always use your most recent med list.               acetaminophen 500 MG tablet  Commonly known as:  TYLENOL  Take 500 mg by mouth every 6 (six) hours as needed for mild pain.     amLODipine 10 MG tablet  Commonly known as:  NORVASC  Take 1 tablet (10 mg total) by mouth every morning.     cloNIDine 0.1 MG tablet  Commonly known as:  CATAPRES  Take  1 tablet (0.1 mg total) by mouth 2 (two) times daily.     gabapentin 300 MG capsule  Commonly known as:  NEURONTIN  Take 1 capsule (300 mg total) by mouth at bedtime.     insulin aspart 100 UNIT/ML injection  Commonly known as:  novoLOG  Inject 9-12 Units into the skin 3 (three) times daily before meals. SSI     insulin glargine 100 UNIT/ML injection  Commonly known as:  LANTUS  Inject 0.22-0.36 mLs (22-36 Units total) into the skin at bedtime.     metoCLOPramide 10 MG tablet  Commonly known as:  REGLAN  Take 10 mg by mouth 4 (four) times daily.     metoprolol 50 MG tablet  Commonly known as:  LOPRESSOR     pantoprazole 40 MG tablet  Commonly known as:  PROTONIX  Take 1 tablet (40 mg total) by mouth every morning.     sucralfate 1 G tablet  Commonly known as:  CARAFATE  Take 1 g by mouth 4 (four) times daily -  before meals and at bedtime.     traMADol 50 MG tablet  Commonly known as:  ULTRAM        Allergies: No Known Allergies  Past Medical History  Diagnosis Date  .  Hypertension   . Diabetes mellitus without complication   . Gastritis   . Gastroparesis     Past Surgical History  Procedure Laterality Date  . Cholecystectomy    . Knee surgery Left   . Flexible sigmoidoscopy N/A 06/11/2013    Procedure: FLEXIBLE SIGMOIDOSCOPY;  Surgeon: Duane Mayer, MD;  Location: [facility];  Service: Endoscopy;  Laterality: N/A;  . Esophagogastroduodenoscopy N/A 06/11/2013    Procedure: ESOPHAGOGASTRODUODENOSCOPY (EGD);  Surgeon: Duane Mayer, MD;  Location: [facility];  Service: Endoscopy;  Laterality: N/A;    No family history on file.  Social History:  reports that he has never smoked. He has never used smokeless tobacco. He reports that he drinks alcohol. He reports that he does not use illicit drugs.    Review of Systems       Lipids: Unknown      Headaches: None             Skin: No rash or infections     Thyroid:  No  unusual fatigue.     He periodically has episodes of sweating      The blood pressure has been for 3 years     No swelling of feet.     No shortness of breath on exertion.     Bowel habits:  Normal. He was told to have had gastroparesis over the last 2-3 years. This has been confirmed by gastric emptying studies. Waiting for gastroenterology consultation. He has had periodic episodes of vomiting requiring hospitalizations. Usually does not get early satiety and recently has not had any nausea. Trying to take Reglan about 3 times a day before eating        No frequency of urination or nocturia       No joint  pains.         No history of Numbness, tingling or burning in feet . Will have some shooting pains in his feet and legs, mostly on the right side   LABS:  Office Visit on 09/18/2013  Component Date Value Ref Range Status  . POC Glucose 09/18/2013 208* 70 - 99 mg/dl Final    Physical Examination:  BP 124/83  Pulse 86  Temp(Src) 98.3 F (36.8 C)  Resp 16  Ht 6\' 4"  (1.93 m)  Wt 279 lb 6.4 oz (126.735 kg)   BMI 34.02 kg/m2  SpO2 98%  GENERAL:  mild generalized obesity present. HEENT:         Eye exam shows normal external appearance. Fundus exam shows no retinopathy. Oral exam shows normal mucosa .  NECK:         General:  Neck exam shows no lymphadenopathy. Carotids are normal to palpation and no bruit heard.  Thyroid is not enlarged and no nodules felt.   LUNGS:         Chest is symmetrical. Lungs are clear to auscultation.Marland Kitchen   HEART:         Heart sounds:  S1 and S2 are normal. No murmurs or clicks heard., no S3 or S4.   ABDOMEN:  no distention present. Liver and spleen are not palpable. No other mass or tenderness present.  EXTREMITIES:     There is no edema. No skin lesions present.  NEUROLOGICAL:        Vibration sense is mildly reduced in toes. Ankle jerks are absent bilaterally.          Diabetic foot exam: Mild decrease in monofilament sensation distally in the toes MUSCULOSKELETAL:       There is no enlargement or deformity of the joints.  SKIN:       No rash, lesions or abnormal pigmentation       ASSESSMENT:  Diabetes type 1, long-standing He probably has had  persistent poor control judging from his multiple complications mostly related to peripheral neuropathy or autonomic neuropathy  Problems identified: Recently A1c has been consistently over 9% Has poor understanding of insulin management and blood sugar control Currently taking NovoLog 30 minutes postprandially and not adjusting it based on meal sizes of carbohydrate intake but on the postprandial blood sugar Getting inadequate Lantus insulin as judged by persistently high fasting blood sugar and today has high readings several hours after his last meal Adjusting Lantus based on blood sugar at bedtime rather than fasting levels and changing the level by several units at a time Unable to assess whether Lantus as lasting 24 hours since he did not bring his blood sugar record Periodically has high fat meals not covered by  insulin   Complications: Gastroparesis and diaphoretic episodes related to autonomic neuropathy, some evidence of peripheral neuropathy Not clear if he has retinopathy or nephropathy as he has not had any recent eye exams are microalbumin level  PLAN:    Discussed basic concepts of dosing Lantus insulin once a day or twice a day based on fasting blood sugars  Advised him on timing of NovoLog insulin to cover meals with preprandial dosing; discussed concepts of adjusting NovoLog based on carbohydrate counting as well as adjusting for high readings before the meal  Given him  a booklet on carbohydrate counting  He needs to bring his glucose monitor for download and also check blood sugars  periodically 2 hours after meals  Will need to schedule him for dietitian and nurse educator consultations  Check urine microalbumin  He needs to get a regular eye exam, may be still sugars are better controlled  To review previous records when available including assessment of lipid levels  Referral to gastroenterologist for evaluation of gastroparesis  Patient Instructions  Lantus 24 units and adjust as directed  Novolog 3 units per starch and 1 unit per 50 points over 100; take  before eating  Bring meter   Counseling time over 50% of today's 60 minute visit   Khing Belcher 09/18/2013, 2:31 PM   Note: This note was prepared with Dragon voice recognition system technology. Any transcriptional errors that result from this process are unintentional.

## 2013-09-18 NOTE — Patient Instructions (Addendum)
Lantus 24 units and adjust as directed  Novolog 3 units per starch and 1 unit per 50 points over 100; take before eating  Bring meter

## 2013-09-20 ENCOUNTER — Emergency Department (HOSPITAL_COMMUNITY)
Admission: EM | Admit: 2013-09-20 | Discharge: 2013-09-21 | Disposition: A | Discharge status: Home / Self Care | Payer: BC Managed Care – PPO | Attending: Emergency Medicine | Admitting: Emergency Medicine

## 2013-09-20 ENCOUNTER — Encounter (HOSPITAL_COMMUNITY): Payer: Self-pay | Admitting: Emergency Medicine

## 2013-09-20 DIAGNOSIS — K3184 Gastroparesis: Secondary | ICD-10-CM | POA: Insufficient documentation

## 2013-09-20 DIAGNOSIS — Z79899 Other long term (current) drug therapy: Secondary | ICD-10-CM | POA: Insufficient documentation

## 2013-09-20 DIAGNOSIS — Z794 Long term (current) use of insulin: Secondary | ICD-10-CM | POA: Insufficient documentation

## 2013-09-20 DIAGNOSIS — E1143 Type 2 diabetes mellitus with diabetic autonomic (poly)neuropathy: Secondary | ICD-10-CM

## 2013-09-20 DIAGNOSIS — Z9089 Acquired absence of other organs: Secondary | ICD-10-CM | POA: Insufficient documentation

## 2013-09-20 DIAGNOSIS — R1110 Vomiting, unspecified: Secondary | ICD-10-CM | POA: Insufficient documentation

## 2013-09-20 DIAGNOSIS — E1149 Type 2 diabetes mellitus with other diabetic neurological complication: Secondary | ICD-10-CM | POA: Insufficient documentation

## 2013-09-20 DIAGNOSIS — I10 Essential (primary) hypertension: Secondary | ICD-10-CM | POA: Insufficient documentation

## 2013-09-20 LAB — URINE MICROSCOPIC-ADD ON

## 2013-09-20 LAB — CBC WITH DIFFERENTIAL/PLATELET
Basophils Absolute: 0 10*3/uL (ref 0.0–0.1)
Basophils Relative: 0 % (ref 0–1)
Eosinophils Absolute: 0 10*3/uL (ref 0.0–0.7)
Eosinophils Relative: 0 % (ref 0–5)
HCT: 44.6 % (ref 39.0–52.0)
Hemoglobin: 15.1 g/dL (ref 13.0–17.0)
Lymphocytes Relative: 14 % (ref 12–46)
Lymphs Abs: 1.5 10*3/uL (ref 0.7–4.0)
MCH: 26.4 pg (ref 26.0–34.0)
MCHC: 33.9 g/dL (ref 30.0–36.0)
MCV: 77.8 fL — ABNORMAL LOW (ref 78.0–100.0)
Monocytes Absolute: 0.4 10*3/uL (ref 0.1–1.0)
Monocytes Relative: 4 % (ref 3–12)
Neutro Abs: 8.7 10*3/uL — ABNORMAL HIGH (ref 1.7–7.7)
Neutrophils Relative %: 82 % — ABNORMAL HIGH (ref 43–77)
Platelets: 243 10*3/uL (ref 150–400)
RBC: 5.73 MIL/uL (ref 4.22–5.81)
RDW: 12.2 % (ref 11.5–15.5)
WBC: 10.6 10*3/uL — ABNORMAL HIGH (ref 4.0–10.5)

## 2013-09-20 MED ORDER — LORAZEPAM 2 MG/ML IJ SOLN
2.0000 mg | Freq: Once | INTRAMUSCULAR | Status: AC
  Administered 2013-09-20: 2 mg via INTRAVENOUS
  Filled 2013-09-20: qty 1

## 2013-09-20 MED ORDER — HYDROMORPHONE HCL PF 1 MG/ML IJ SOLN
1.0000 mg | Freq: Once | INTRAMUSCULAR | Status: AC
  Administered 2013-09-20: 1 mg via INTRAVENOUS
  Filled 2013-09-20: qty 1

## 2013-09-20 MED ORDER — PROMETHAZINE HCL 25 MG/ML IJ SOLN
25.0000 mg | Freq: Once | INTRAMUSCULAR | Status: AC
  Administered 2013-09-20: 25 mg via INTRAVENOUS
  Filled 2013-09-20: qty 1

## 2013-09-20 MED ORDER — MORPHINE SULFATE 4 MG/ML IJ SOLN
6.0000 mg | Freq: Once | INTRAMUSCULAR | Status: AC
  Administered 2013-09-20: 6 mg via INTRAVENOUS
  Filled 2013-09-20: qty 2

## 2013-09-20 MED ORDER — PROMETHAZINE HCL 25 MG/ML IJ SOLN: 12.5000 mg | Freq: Once | INTRAMUSCULAR | Status: DC

## 2013-09-20 MED ORDER — SODIUM CHLORIDE 0.9 % IV BOLUS (SEPSIS)
2000.0000 mL | Freq: Once | INTRAVENOUS | Status: AC
  Administered 2013-09-20: 2000 mL via INTRAVENOUS

## 2013-09-20 MED ORDER — METOCLOPRAMIDE HCL 5 MG/ML IJ SOLN
10.0000 mg | Freq: Once | INTRAMUSCULAR | Status: AC
  Administered 2013-09-20: 10 mg via INTRAVENOUS
  Filled 2013-09-20: qty 2

## 2013-09-20 NOTE — ED Notes (Signed)
Pt arrived to the ED with a complaint of emesis.  Pt has a hx of gastroparesis.  Pt states he has had over 20 episodes of emesis since 1000 hr this am.  Pt states he takes regalan for this which he has taken "a couple doses" throughout the day.

## 2013-09-21 ENCOUNTER — Inpatient Hospital Stay (HOSPITAL_COMMUNITY)
Admission: EM | Admit: 2013-09-21 | Discharge: 2013-10-06 | DRG: 074 | Disposition: A | Discharge status: Home / Self Care | Payer: BC Managed Care – PPO | Attending: Family Medicine | Admitting: Family Medicine

## 2013-09-21 ENCOUNTER — Encounter (HOSPITAL_COMMUNITY): Payer: Self-pay | Admitting: Emergency Medicine

## 2013-09-21 DIAGNOSIS — E1142 Type 2 diabetes mellitus with diabetic polyneuropathy: Secondary | ICD-10-CM | POA: Diagnosis present

## 2013-09-21 DIAGNOSIS — K3184 Gastroparesis: Secondary | ICD-10-CM | POA: Diagnosis present

## 2013-09-21 DIAGNOSIS — Z8719 Personal history of other diseases of the digestive system: Secondary | ICD-10-CM

## 2013-09-21 DIAGNOSIS — K5289 Other specified noninfective gastroenteritis and colitis: Secondary | ICD-10-CM | POA: Diagnosis present

## 2013-09-21 DIAGNOSIS — Z794 Long term (current) use of insulin: Secondary | ICD-10-CM

## 2013-09-21 DIAGNOSIS — E1149 Type 2 diabetes mellitus with other diabetic neurological complication: Secondary | ICD-10-CM | POA: Diagnosis not present

## 2013-09-21 DIAGNOSIS — E1143 Type 2 diabetes mellitus with diabetic autonomic (poly)neuropathy: Secondary | ICD-10-CM | POA: Diagnosis present

## 2013-09-21 DIAGNOSIS — E1140 Type 2 diabetes mellitus with diabetic neuropathy, unspecified: Secondary | ICD-10-CM | POA: Diagnosis present

## 2013-09-21 DIAGNOSIS — Z765 Malingerer [conscious simulation]: Secondary | ICD-10-CM

## 2013-09-21 DIAGNOSIS — K449 Diaphragmatic hernia without obstruction or gangrene: Secondary | ICD-10-CM | POA: Diagnosis present

## 2013-09-21 DIAGNOSIS — K5900 Constipation, unspecified: Secondary | ICD-10-CM | POA: Diagnosis present

## 2013-09-21 DIAGNOSIS — E669 Obesity, unspecified: Secondary | ICD-10-CM | POA: Diagnosis present

## 2013-09-21 DIAGNOSIS — Z9089 Acquired absence of other organs: Secondary | ICD-10-CM

## 2013-09-21 DIAGNOSIS — I10 Essential (primary) hypertension: Secondary | ICD-10-CM | POA: Diagnosis present

## 2013-09-21 DIAGNOSIS — Z79899 Other long term (current) drug therapy: Secondary | ICD-10-CM

## 2013-09-21 DIAGNOSIS — E876 Hypokalemia: Secondary | ICD-10-CM | POA: Diagnosis not present

## 2013-09-21 DIAGNOSIS — R1110 Vomiting, unspecified: Secondary | ICD-10-CM

## 2013-09-21 DIAGNOSIS — E109 Type 1 diabetes mellitus without complications: Secondary | ICD-10-CM

## 2013-09-21 DIAGNOSIS — Z833 Family history of diabetes mellitus: Secondary | ICD-10-CM

## 2013-09-21 DIAGNOSIS — I951 Orthostatic hypotension: Secondary | ICD-10-CM | POA: Diagnosis not present

## 2013-09-21 DIAGNOSIS — R1115 Cyclical vomiting syndrome unrelated to migraine: Secondary | ICD-10-CM | POA: Diagnosis not present

## 2013-09-21 DIAGNOSIS — Z6834 Body mass index (BMI) 34.0-34.9, adult: Secondary | ICD-10-CM

## 2013-09-21 DIAGNOSIS — E119 Type 2 diabetes mellitus without complications: Secondary | ICD-10-CM

## 2013-09-21 DIAGNOSIS — R112 Nausea with vomiting, unspecified: Secondary | ICD-10-CM | POA: Diagnosis present

## 2013-09-21 DIAGNOSIS — IMO0001 Reserved for inherently not codable concepts without codable children: Secondary | ICD-10-CM

## 2013-09-21 HISTORY — DX: Obesity, unspecified: E66.9

## 2013-09-21 HISTORY — DX: Type 2 diabetes mellitus with diabetic neuropathy, unspecified: E11.40

## 2013-09-21 LAB — URINALYSIS, ROUTINE W REFLEX MICROSCOPIC
Bilirubin Urine: NEGATIVE
Glucose, UA: >1000 mg/dL — AB
Ketones, ur: 15 mg/dL — AB
Leukocytes, UA: NEGATIVE
Nitrite: NEGATIVE
Protein, ur: NEGATIVE mg/dL
Specific Gravity, Urine: 1.026 (ref 1.005–1.030)
Urobilinogen, UA: 1 mg/dL (ref 0.0–1.0)
pH: 5.5 (ref 5.0–8.0)

## 2013-09-21 LAB — COMPREHENSIVE METABOLIC PANEL
ALT: 14 U/L (ref 0–53)
AST: 16 U/L (ref 0–37)
Albumin: 5.1 g/dL (ref 3.5–5.2)
Alkaline Phosphatase: 120 U/L — ABNORMAL HIGH (ref 39–117)
Anion gap: 23 — ABNORMAL HIGH (ref 5–15)
BUN: 16 mg/dL (ref 6–23)
CO2: 20 mEq/L (ref 19–32)
Calcium: 10.5 mg/dL (ref 8.4–10.5)
Chloride: 98 mEq/L (ref 96–112)
Creatinine, Ser: 1.23 mg/dL (ref 0.50–1.35)
GFR calc Af Amer: 86 mL/min — ABNORMAL LOW (ref >90)
GFR calc non Af Amer: 74 mL/min — ABNORMAL LOW (ref >90)
Glucose, Bld: 288 mg/dL — ABNORMAL HIGH (ref 70–99)
Potassium: 4.2 mEq/L (ref 3.7–5.3)
Sodium: 141 mEq/L (ref 137–147)
Total Bilirubin: 0.5 mg/dL (ref 0.3–1.2)
Total Protein: 9 g/dL — ABNORMAL HIGH (ref 6.0–8.3)

## 2013-09-21 MED ORDER — SODIUM CHLORIDE 0.9 % IV BOLUS (SEPSIS)
1000.0000 mL | Freq: Once | INTRAVENOUS | Status: AC
  Administered 2013-09-22: 1000 mL via INTRAVENOUS

## 2013-09-21 MED ORDER — METOCLOPRAMIDE HCL 5 MG/ML IJ SOLN
10.0000 mg | Freq: Once | INTRAMUSCULAR | Status: AC
  Administered 2013-09-22: 10 mg via INTRAVENOUS
  Filled 2013-09-21: qty 2

## 2013-09-21 MED ORDER — PROMETHAZINE HCL 25 MG PO TABS: 25.0000 mg | ORAL_TABLET | Freq: Three times a day (TID) | ORAL | Status: DC | PRN

## 2013-09-21 MED ORDER — PROMETHAZINE HCL 25 MG/ML IJ SOLN
25.0000 mg | Freq: Once | INTRAMUSCULAR | Status: DC
  Filled 2013-09-21: qty 1

## 2013-09-21 MED ORDER — SODIUM CHLORIDE 0.9 % IV BOLUS (SEPSIS)
1000.0000 mL | Freq: Once | INTRAVENOUS | Status: AC
  Administered 2013-09-21: 1000 mL via INTRAVENOUS

## 2013-09-21 MED ORDER — ONDANSETRON HCL 4 MG/2ML IJ SOLN
4.0000 mg | Freq: Once | INTRAMUSCULAR | Status: AC
  Administered 2013-09-22: 4 mg via INTRAVENOUS
  Filled 2013-09-21: qty 2

## 2013-09-21 NOTE — ED Provider Notes (Signed)
CSN: [id number]     Arrival date & time 09/20/13  1958 History   First MD Initiated Contact with Patient 09/20/13 2014     Chief Complaint  Patient presents with  . Emesis     (Consider location/radiation/quality/duration/timing/severity/associated sxs/prior Treatment) HPI  Patient presents to the emergency department with complaint of gastroparesis.  Patient, states, that he's had 20 episodes of vomiting, since 10 this morning.  The patient, states, that he takes Reglan and and Ultram without relief of his symptoms.  Patient, states, that nothing seems make his condition, better or worse.  The patient denies chest pain, shortness of breath, rash, fever, headache, blurred vision, weakness, dizziness, back pain, neck pain, dysuria, hematuria, hematemesis, bloody stool, altered mental status, decreased appetite, increased urination, increased thirst or syncope.  Patient, states, that his blood sugars have been running in the general range that they normally run. Past Medical History  Diagnosis Date  . Hypertension   . Diabetes mellitus without complication   . Gastritis   . Gastroparesis    Past Surgical History  Procedure Laterality Date  . Cholecystectomy    . Knee surgery Left   . Flexible sigmoidoscopy N/A 06/11/2013    Procedure: FLEXIBLE SIGMOIDOSCOPY;  Surgeon: [name], MD;  Location: [facility];  Service: Endoscopy;  Laterality: N/A;  . Esophagogastroduodenoscopy N/A 06/11/2013    Procedure: ESOPHAGOGASTRODUODENOSCOPY (EGD);  Surgeon: [name], MD;  Location: [facility];  Service: Endoscopy;  Laterality: N/A;   History reviewed. No pertinent family history. History  Substance Use Topics  . Smoking status: Never Smoker   . Smokeless tobacco: Never Used  . Alcohol Use: Yes     Comment: "none really" - 08/27/13    Review of Systems  All other systems negative except as documented in the HPI. All pertinent positives and negatives as reviewed in the  HPI.  Allergies  Review of patient's allergies indicates no known allergies.  Home Medications   Prior to Admission medications   Medication Sig Start Date End Date Taking? Authorizing Provider  acetaminophen (TYLENOL) 500 MG tablet Take 500 mg by mouth every 6 (six) hours as needed for mild pain.    Yes Historical Provider, MD  amLODipine (NORVASC) 10 MG tablet Take 1 tablet (10 mg total) by mouth every morning. 08/29/13  Yes Venetia Maxon Rama, MD  cloNIDine (CATAPRES) 0.1 MG tablet Take 1 tablet (0.1 mg total) by mouth 2 (two) times daily. 08/29/13  Yes Christina P Rama, MD  gabapentin (NEURONTIN) 300 MG capsule Take 1 capsule (300 mg total) by mouth at bedtime. 08/29/13  Yes Christina P Rama, MD  insulin aspart (NOVOLOG) 100 UNIT/ML injection Inject 9-12 Units into the skin 3 (three) times daily before meals. SSI 08/29/13  Yes Christina P Rama, MD  insulin glargine (LANTUS) 100 UNIT/ML injection Inject 22-36 Units into the skin at bedtime. Pt is on a ssi   Yes Historical Provider, MD  metoCLOPramide (REGLAN) 10 MG tablet Take 10 mg by mouth 4 (four) times daily.   Yes Historical Provider, MD  metoprolol (LOPRESSOR) 50 MG tablet Take 50 mg by mouth 2 (two) times daily.  08/20/13  Yes Historical Provider, MD  pantoprazole (PROTONIX) 40 MG tablet Take 1 tablet (40 mg total) by mouth every morning. 08/29/13  Yes Christina P Rama, MD  sucralfate (CARAFATE) 1 G tablet Take 1 g by mouth 4 (four) times daily -  before meals and at bedtime.   Yes Historical Provider, MD  traMADol Veatrice Bourbon)  50 MG tablet Take 50 mg by mouth every 6 (six) hours as needed for moderate pain.  08/19/13  Yes Historical Provider, MD   BP 150/91  Pulse 112  Temp(Src) 98.1 F (36.7 C) (Oral)  Resp 20  Wt 279 lb (126.554 kg)  SpO2 98% Physical Exam  Nursing note and vitals reviewed. Constitutional: He is oriented to person, place, and time. He appears well-developed and well-nourished. He appears distressed.  HENT:  Head:  Normocephalic and atraumatic.  Mouth/Throat: Oropharynx is clear and moist.  Eyes: Pupils are equal, round, and reactive to light.  Neck: Normal range of motion. Neck supple.  Cardiovascular: Normal rate, regular rhythm and normal heart sounds.  Exam reveals no gallop and no friction rub.   No murmur heard. Pulmonary/Chest: Effort normal and breath sounds normal. No respiratory distress.  Abdominal: Soft. Bowel sounds are normal. He exhibits no distension. There is no tenderness.  Musculoskeletal: He exhibits no edema.  Neurological: He is alert and oriented to person, place, and time. He exhibits normal muscle tone. Coordination normal.  Skin: Skin is warm and dry. No rash noted. No erythema.    ED Course  Procedures (including critical care time) Labs Review Labs Reviewed  COMPREHENSIVE METABOLIC PANEL - Abnormal; Notable for the following:    Glucose, Bld 288 (*)    Total Protein 9.0 (*)    Alkaline Phosphatase 120 (*)    GFR calc non Af Amer 74 (*)    GFR calc Af Amer 86 (*)    Anion gap 23 (*)    All other components within normal limits  CBC WITH DIFFERENTIAL - Abnormal; Notable for the following:    WBC 10.6 (*)    MCV 77.8 (*)    Neutrophils Relative % 82 (*)    Neutro Abs 8.7 (*)    All other components within normal limits  URINALYSIS, ROUTINE W REFLEX MICROSCOPIC - Abnormal; Notable for the following:    APPearance TURBID (*)    Glucose, UA >1000 (*)    Hgb urine dipstick TRACE (*)    Ketones, ur 15 (*)    All other components within normal limits  URINE MICROSCOPIC-ADD ON   Patient is resting comfortably and when you awaken him he starts to gag and vomit.  I rechecked the patient.  X3.  Where he is sleeping comfortably without any distress or vomiting.  Patient only starts to vomit when he is awakened.  Nurses observed.  The patient sleeping comfortably and has had 2 physically shake.  The patient to wake him up and he is not vomiting, until she awakens him.  The  patient is rechecked at 1:25 AM and he is sleeping and resting comfortably.  I will given a fluid challenge and anticipated discharge.      Brent General, PA-C 09/21/13 0126

## 2013-09-21 NOTE — ED Provider Notes (Signed)
Pt with hx of gastroparesis here with persistent n/v.  Anticipate discharge once pt pass PO trial.    4:42 AM Pt has been without vomiting for the past 4 hrs.  Sleeping comfortably.  Plan to discharge pt with sxs treatment.    BP 145/80  Pulse 98  Temp(Src) 98.1 F (36.7 C) (Oral)  Resp 16  Wt 279 lb (126.554 kg)  SpO2 98%  I have reviewed nursing notes and vital signs. I personally reviewed the imaging tests through PACS system  I reviewed available ER/hospitalization records thought the EMR  Results for orders placed during the hospital encounter of 09/20/13  COMPREHENSIVE METABOLIC PANEL      Result Value Ref Range   Sodium 141  137 - 147 mEq/L   Potassium 4.2  3.7 - 5.3 mEq/L   Chloride 98  96 - 112 mEq/L   CO2 20  19 - 32 mEq/L   Glucose, Bld 288 (*) 70 - 99 mg/dL   BUN 16  6 - 23 mg/dL   Creatinine, Ser 1.23  0.50 - 1.35 mg/dL   Calcium 10.5  8.4 - 10.5 mg/dL   Total Protein 9.0 (*) 6.0 - 8.3 g/dL   Albumin 5.1  3.5 - 5.2 g/dL   AST 16  0 - 37 U/L   ALT 14  0 - 53 U/L   Alkaline Phosphatase 120 (*) 39 - 117 U/L   Total Bilirubin 0.5  0.3 - 1.2 mg/dL   GFR calc non Af Amer 74 (*) >90 mL/min   GFR calc Af Amer 86 (*) >90 mL/min   Anion gap 23 (*) 5 - 15  CBC WITH DIFFERENTIAL      Result Value Ref Range   WBC 10.6 (*) 4.0 - 10.5 K/uL   RBC 5.73  4.22 - 5.81 MIL/uL   Hemoglobin 15.1  13.0 - 17.0 g/dL   HCT 44.6  39.0 - 52.0 %   MCV 77.8 (*) 78.0 - 100.0 fL   MCH 26.4  26.0 - 34.0 pg   MCHC 33.9  30.0 - 36.0 g/dL   RDW 12.2  11.5 - 15.5 %   Platelets 243  150 - 400 K/uL   Neutrophils Relative % 82 (*) 43 - 77 %   Neutro Abs 8.7 (*) 1.7 - 7.7 K/uL   Lymphocytes Relative 14  12 - 46 %   Lymphs Abs 1.5  0.7 - 4.0 K/uL   Monocytes Relative 4  3 - 12 %   Monocytes Absolute 0.4  0.1 - 1.0 K/uL   Eosinophils Relative 0  0 - 5 %   Eosinophils Absolute 0.0  0.0 - 0.7 K/uL   Basophils Relative 0  0 - 1 %   Basophils Absolute 0.0  0.0 - 0.1 K/uL  URINALYSIS, ROUTINE W  REFLEX MICROSCOPIC      Result Value Ref Range   Color, Urine YELLOW  YELLOW   APPearance CLOUDY (*) CLEAR   Specific Gravity, Urine 1.026  1.005 - 1.030   pH 5.5  5.0 - 8.0   Glucose, UA >1000 (*) NEGATIVE mg/dL   Hgb urine dipstick TRACE (*) NEGATIVE   Bilirubin Urine NEGATIVE  NEGATIVE   Ketones, ur 15 (*) NEGATIVE mg/dL   Protein, ur NEGATIVE  NEGATIVE mg/dL   Urobilinogen, UA 1.0  0.0 - 1.0 mg/dL   Nitrite NEGATIVE  NEGATIVE   Leukocytes, UA NEGATIVE  NEGATIVE  URINE MICROSCOPIC-ADD ON      Result Value Ref Range  Squamous Epithelial / LPF RARE  RARE   WBC, UA 0-2  <3 WBC/hpf   RBC / HPF 0-2  <3 RBC/hpf   Bacteria, UA RARE  RARE   No results found.    Domenic Moras, PA-C 09/21/13 [phone]

## 2013-09-21 NOTE — ED Notes (Signed)
MD at bedside.  EDPA BOWIE IN TO SEE THIS PT

## 2013-09-21 NOTE — Discharge Instructions (Signed)
Return here as needed.  Followup with your primary care Dr. slowly increase your fluid intake

## 2013-09-21 NOTE — ED Provider Notes (Signed)
CSN: [id number]     Arrival date & time 09/21/13  2233 History   First MD Initiated Contact with Patient 09/21/13 2320     Chief Complaint  Patient presents with  . Emesis     (Consider location/radiation/quality/duration/timing/severity/associated sxs/prior Treatment) HPI Comments: Patient with a history of DM and Gastroparesis presents today with a chief complaint of nausea and vomiting.  He reports that his symptoms have been present since yesterday afternoon.  He has had numerous episodes of vomiting.  He denies diarrhea.  No blood in his emesis or blood in his stool.  He states that he has taken both Phenergan and Reglan without improvement.  Symptoms similar to symptoms that he has had in the past with Gastroparesis.  He has had several previous hospital admissions for this in the past.  Patient was seen in the ED for the same yesterday and was discharged this morning after several rounds of IV antiemetics.  He reports that his vomiting began again shortly after arriving home.  He reports associated epigastric abdominal pain that began after vomiting several times.  Pain does not radiate.  He denies fever, chills, chest pain, or urinary symptoms.   Past surgical history significant for a Lap Cholecystectomy.  He denies any other abdominal surgeries.    The history is provided by the patient.    Past Medical History  Diagnosis Date  . Hypertension   . Diabetes mellitus without complication   . Gastritis   . Gastroparesis    Past Surgical History  Procedure Laterality Date  . Cholecystectomy    . Knee surgery Left   . Flexible sigmoidoscopy N/A 06/11/2013    Procedure: FLEXIBLE SIGMOIDOSCOPY;  Surgeon: [name], MD;  Location: [facility];  Service: Endoscopy;  Laterality: N/A;  . Esophagogastroduodenoscopy N/A 06/11/2013    Procedure: ESOPHAGOGASTRODUODENOSCOPY (EGD);  Surgeon: [name], MD;  Location: [facility];  Service: Endoscopy;  Laterality: N/A;   History  reviewed. No pertinent family history. History  Substance Use Topics  . Smoking status: Never Smoker   . Smokeless tobacco: Never Used  . Alcohol Use: Yes     Comment: "none really" - 08/27/13    Review of Systems  All other systems reviewed and are negative.     Allergies  Review of patient's allergies indicates no known allergies.  Home Medications   Prior to Admission medications   Medication Sig Start Date End Date Taking? Authorizing Provider  acetaminophen (TYLENOL) 500 MG tablet Take 500 mg by mouth every 6 (six) hours as needed for mild pain.    Yes Historical Provider, MD  amLODipine (NORVASC) 10 MG tablet Take 1 tablet (10 mg total) by mouth every morning. 08/29/13  Yes Venetia Maxon Rama, MD  cloNIDine (CATAPRES) 0.1 MG tablet Take 1 tablet (0.1 mg total) by mouth 2 (two) times daily. 08/29/13  Yes Christina P Rama, MD  gabapentin (NEURONTIN) 300 MG capsule Take 1 capsule (300 mg total) by mouth at bedtime. 08/29/13  Yes Christina P Rama, MD  insulin aspart (NOVOLOG) 100 UNIT/ML injection Inject 9-12 Units into the skin 3 (three) times daily before meals. SSI 08/29/13  Yes Christina P Rama, MD  insulin glargine (LANTUS) 100 UNIT/ML injection Inject 22 Units into the skin at bedtime.    Yes Historical Provider, MD  metoCLOPramide (REGLAN) 10 MG tablet Take 10 mg by mouth 4 (four) times daily.   Yes Historical Provider, MD  metoprolol (LOPRESSOR) 50 MG tablet Take 50 mg by mouth  2 (two) times daily.  08/20/13  Yes Historical Provider, MD  pantoprazole (PROTONIX) 40 MG tablet Take 1 tablet (40 mg total) by mouth every morning. 08/29/13  Yes Venetia Maxon Rama, MD  promethazine (PHENERGAN) 25 MG tablet Take 1 tablet (25 mg total) by mouth every 8 (eight) hours as needed for nausea or vomiting. 09/21/13  Yes Resa Miner Lawyer, PA-C  sucralfate (CARAFATE) 1 G tablet Take 1 g by mouth 4 (four) times daily -  before meals and at bedtime.   Yes Historical Provider, MD  traMADol (ULTRAM) 50  MG tablet Take 50 mg by mouth every 6 (six) hours as needed for moderate pain.  08/19/13  Yes Historical Provider, MD   BP 165/102  Pulse 110  Temp(Src) 98.7 F (37.1 C) (Oral)  Resp 24  SpO2 100% Physical Exam  Nursing note and vitals reviewed. Constitutional: He appears well-developed and well-nourished.  HENT:  Head: Normocephalic and atraumatic.  Mouth/Throat: Oropharynx is clear and moist.  Neck: Normal range of motion. Neck supple.  Cardiovascular: Normal rate, regular rhythm and normal heart sounds.   Pulmonary/Chest: Effort normal and breath sounds normal.  Abdominal: Soft. Normal appearance and bowel sounds are normal. He exhibits no distension and no mass. There is tenderness in the epigastric area. There is no rebound and no guarding.  Neurological: He is alert.  Skin: Skin is warm and dry.  Psychiatric: He has a normal mood and affect.    ED Course  Procedures (including critical care time) Labs Review Labs Reviewed  CBC WITH DIFFERENTIAL  URINALYSIS, ROUTINE W REFLEX MICROSCOPIC  COMPREHENSIVE METABOLIC PANEL  LIPASE, BLOOD    Imaging Review Dg Abd Acute W/chest  09/22/2013   CLINICAL DATA:  Nausea, vomiting and upper abdominal pain.  EXAM: ACUTE ABDOMEN SERIES (ABDOMEN 2 VIEW & CHEST 1 VIEW)  COMPARISON:  Chest and abdominal radiographs performed 08/17/2013  FINDINGS: The lungs are well-aerated and clear. There is no evidence of focal opacification, pleural effusion or pneumothorax. The cardiomediastinal silhouette is within normal limits.  The visualized bowel gas pattern is unremarkable. Scattered stool and air are seen within the colon; there is no evidence of small bowel dilatation to suggest obstruction. No free intra-abdominal air is identified on the provided upright view. Clips are noted within the right upper quadrant, reflecting prior cholecystectomy.  No acute osseous abnormalities are seen; the sacroiliac joints are unremarkable in appearance.  IMPRESSION:  1. Unremarkable bowel gas pattern; no free intra-abdominal air seen. Moderate amount of stool noted in the colon. 2. No acute cardiopulmonary process identified.   Electronically Signed   By: Garald Balding M.D.   On: 09/22/2013 01:22     EKG Interpretation None     12:51 AM Reassessed patient.  She reports that she is still feeling nauseous.  Patient continues to have vomiting. 1:39 AM Discussed with Dr. Benny Lennert who has agreed to admit the patient. MDM   Final diagnoses:  None   Patient with a history of DM and Gastroparesis presents today with nausea, vomiting, and epigastric abdominal pain.  He was seen in the ED for the same yesterday and discharged this morning.  He has taken Reglan and Phenergan at home without improvement.  Patient given IV Reglan, Zofran, and Phenergan in the ED without improvement in symptoms.   Acute abdominal series negative for obstruction.  Labs unremarkable.  However, due to the fact that the vomiting is intractable after several rounds of IV antiemetics, feel that the patient needs admission  for additional management.  Patient discussed with Dr. Tomi Bamberger who is in agreement with the plan.  Patient admitted to [facility], PA-C 09/22/13 2318

## 2013-09-21 NOTE — ED Notes (Signed)
Pt was seen and treated last night for the same, he was discharged at [facility] and has been vomiting all day today

## 2013-09-21 NOTE — ED Provider Notes (Signed)
Medical screening examination/treatment/procedure(s) were performed by non-physician practitioner and as supervising physician I was immediately available for consultation/collaboration.   EKG Interpretation None        Merryl Hacker, MD 09/21/13 [phone]

## 2013-09-21 NOTE — ED Provider Notes (Signed)
Medical screening examination/treatment/procedure(s) were performed by non-physician practitioner and as supervising physician I was immediately available for consultation/collaboration.   EKG Interpretation None       Richarda Blade, MD 09/21/13 [phone]

## 2013-09-22 ENCOUNTER — Encounter (HOSPITAL_COMMUNITY): Payer: Self-pay | Admitting: Internal Medicine

## 2013-09-22 ENCOUNTER — Emergency Department (HOSPITAL_COMMUNITY): Payer: BC Managed Care – PPO

## 2013-09-22 DIAGNOSIS — K5900 Constipation, unspecified: Secondary | ICD-10-CM | POA: Diagnosis present

## 2013-09-22 DIAGNOSIS — Z794 Long term (current) use of insulin: Secondary | ICD-10-CM

## 2013-09-22 DIAGNOSIS — Z9089 Acquired absence of other organs: Secondary | ICD-10-CM | POA: Diagnosis not present

## 2013-09-22 DIAGNOSIS — I10 Essential (primary) hypertension: Secondary | ICD-10-CM

## 2013-09-22 DIAGNOSIS — Z765 Malingerer [conscious simulation]: Secondary | ICD-10-CM | POA: Diagnosis not present

## 2013-09-22 DIAGNOSIS — K5289 Other specified noninfective gastroenteritis and colitis: Secondary | ICD-10-CM | POA: Diagnosis present

## 2013-09-22 DIAGNOSIS — E1149 Type 2 diabetes mellitus with other diabetic neurological complication: Principal | ICD-10-CM

## 2013-09-22 DIAGNOSIS — R1115 Cyclical vomiting syndrome unrelated to migraine: Secondary | ICD-10-CM

## 2013-09-22 DIAGNOSIS — E669 Obesity, unspecified: Secondary | ICD-10-CM | POA: Diagnosis present

## 2013-09-22 DIAGNOSIS — Z833 Family history of diabetes mellitus: Secondary | ICD-10-CM | POA: Diagnosis not present

## 2013-09-22 DIAGNOSIS — K3184 Gastroparesis: Secondary | ICD-10-CM | POA: Diagnosis present

## 2013-09-22 DIAGNOSIS — Z8719 Personal history of other diseases of the digestive system: Secondary | ICD-10-CM | POA: Diagnosis not present

## 2013-09-22 DIAGNOSIS — Z79899 Other long term (current) drug therapy: Secondary | ICD-10-CM | POA: Diagnosis not present

## 2013-09-22 DIAGNOSIS — K449 Diaphragmatic hernia without obstruction or gangrene: Secondary | ICD-10-CM | POA: Diagnosis present

## 2013-09-22 DIAGNOSIS — I951 Orthostatic hypotension: Secondary | ICD-10-CM | POA: Diagnosis not present

## 2013-09-22 DIAGNOSIS — E876 Hypokalemia: Secondary | ICD-10-CM | POA: Diagnosis not present

## 2013-09-22 DIAGNOSIS — Z6834 Body mass index (BMI) 34.0-34.9, adult: Secondary | ICD-10-CM | POA: Diagnosis not present

## 2013-09-22 DIAGNOSIS — E119 Type 2 diabetes mellitus without complications: Secondary | ICD-10-CM

## 2013-09-22 DIAGNOSIS — E1142 Type 2 diabetes mellitus with diabetic polyneuropathy: Secondary | ICD-10-CM | POA: Diagnosis present

## 2013-09-22 LAB — URINALYSIS, ROUTINE W REFLEX MICROSCOPIC
Hgb urine dipstick: NEGATIVE
Ketones, ur: 15 mg/dL — AB
LEUKOCYTES UA: NEGATIVE
Nitrite: NEGATIVE
PH: 5.5 (ref 5.0–8.0)
Protein, ur: NEGATIVE mg/dL
Specific Gravity, Urine: 1.029 (ref 1.005–1.030)
Urobilinogen, UA: 1 mg/dL (ref 0.0–1.0)

## 2013-09-22 LAB — BASIC METABOLIC PANEL
ANION GAP: 15 (ref 5–15)
BUN: 12 mg/dL (ref 6–23)
CHLORIDE: 102 meq/L (ref 96–112)
CO2: 22 meq/L (ref 19–32)
CREATININE: 1.11 mg/dL (ref 0.50–1.35)
Calcium: 8.8 mg/dL (ref 8.4–10.5)
GFR calc Af Amer: >90 mL/min (ref >90)
GFR calc non Af Amer: 84 mL/min — ABNORMAL LOW (ref >90)
Glucose, Bld: 195 mg/dL — ABNORMAL HIGH (ref 70–99)
Potassium: 4.2 mEq/L (ref 3.7–5.3)
SODIUM: 139 meq/L (ref 137–147)

## 2013-09-22 LAB — HEMOGLOBIN A1C
Hgb A1c MFr Bld: 9.3 % — ABNORMAL HIGH (ref <5.7)
MEAN PLASMA GLUCOSE: 220 mg/dL — AB (ref <117)

## 2013-09-22 LAB — CBC WITH DIFFERENTIAL/PLATELET
Basophils Absolute: 0 10*3/uL (ref 0.0–0.1)
Basophils Relative: 0 % (ref 0–1)
Eosinophils Absolute: 0 10*3/uL (ref 0.0–0.7)
Eosinophils Relative: 0 % (ref 0–5)
HCT: 40.3 % (ref 39.0–52.0)
HEMOGLOBIN: 13.7 g/dL (ref 13.0–17.0)
LYMPHS ABS: 1.4 10*3/uL (ref 0.7–4.0)
Lymphocytes Relative: 16 % (ref 12–46)
MCH: 27.2 pg (ref 26.0–34.0)
MCHC: 34 g/dL (ref 30.0–36.0)
MCV: 80.1 fL (ref 78.0–100.0)
MONOS PCT: 3 % (ref 3–12)
Monocytes Absolute: 0.3 10*3/uL (ref 0.1–1.0)
NEUTROS PCT: 81 % — AB (ref 43–77)
Neutro Abs: 7 10*3/uL (ref 1.7–7.7)
PLATELETS: 245 10*3/uL (ref 150–400)
RBC: 5.03 MIL/uL (ref 4.22–5.81)
RDW: 12.4 % (ref 11.5–15.5)
WBC: 8.7 10*3/uL (ref 4.0–10.5)

## 2013-09-22 LAB — URINE MICROSCOPIC-ADD ON

## 2013-09-22 LAB — COMPREHENSIVE METABOLIC PANEL
ALK PHOS: 86 U/L (ref 39–117)
ALT: 14 U/L (ref 0–53)
AST: 17 U/L (ref 0–37)
Albumin: 4.2 g/dL (ref 3.5–5.2)
Anion gap: 17 — ABNORMAL HIGH (ref 5–15)
BILIRUBIN TOTAL: 0.7 mg/dL (ref 0.3–1.2)
BUN: 13 mg/dL (ref 6–23)
CHLORIDE: 98 meq/L (ref 96–112)
CO2: 23 meq/L (ref 19–32)
Calcium: 9.4 mg/dL (ref 8.4–10.5)
Creatinine, Ser: 1.22 mg/dL (ref 0.50–1.35)
GFR calc Af Amer: 87 mL/min — ABNORMAL LOW (ref >90)
GFR, EST NON AFRICAN AMERICAN: 75 mL/min — AB (ref >90)
Glucose, Bld: 247 mg/dL — ABNORMAL HIGH (ref 70–99)
POTASSIUM: 4.1 meq/L (ref 3.7–5.3)
SODIUM: 138 meq/L (ref 137–147)
Total Protein: 7.6 g/dL (ref 6.0–8.3)

## 2013-09-22 LAB — CBC
HEMATOCRIT: 35.7 % — AB (ref 39.0–52.0)
Hemoglobin: 12.3 g/dL — ABNORMAL LOW (ref 13.0–17.0)
MCH: 27 pg (ref 26.0–34.0)
MCHC: 34.5 g/dL (ref 30.0–36.0)
MCV: 78.3 fL (ref 78.0–100.0)
Platelets: ADEQUATE 10*3/uL (ref 150–400)
RBC: 4.56 MIL/uL (ref 4.22–5.81)
RDW: 12.4 % (ref 11.5–15.5)
WBC: 7.2 10*3/uL (ref 4.0–10.5)

## 2013-09-22 LAB — PROTIME-INR
INR: 1.04 (ref 0.00–1.49)
PROTHROMBIN TIME: 13.6 s (ref 11.6–15.2)

## 2013-09-22 LAB — GLUCOSE, CAPILLARY
Glucose-Capillary: 162 mg/dL — ABNORMAL HIGH (ref 70–99)
Glucose-Capillary: 122 mg/dL — ABNORMAL HIGH (ref 70–99)
Glucose-Capillary: 139 mg/dL — ABNORMAL HIGH (ref 70–99)

## 2013-09-22 LAB — LIPASE, BLOOD: Lipase: 19 U/L (ref 11–59)

## 2013-09-22 LAB — APTT: APTT: 24 s (ref 24–37)

## 2013-09-22 MED ORDER — SODIUM CHLORIDE 0.9 % IV SOLN
INTRAVENOUS | Status: DC
  Administered 2013-09-22 – 2013-09-23 (×3): via INTRAVENOUS
  Administered 2013-09-23: 125 mL/h via INTRAVENOUS
  Administered 2013-09-24 – 2013-09-30 (×16): via INTRAVENOUS

## 2013-09-22 MED ORDER — INSULIN ASPART 100 UNIT/ML ~~LOC~~ SOLN: 9.0000 [IU] | Freq: Three times a day (TID) | SUBCUTANEOUS | Status: DC

## 2013-09-22 MED ORDER — INSULIN ASPART 100 UNIT/ML ~~LOC~~ SOLN
0.0000 [IU] | Freq: Three times a day (TID) | SUBCUTANEOUS | Status: DC
  Administered 2013-09-24: 2 [IU] via SUBCUTANEOUS
  Administered 2013-09-24 (×2): 1 [IU] via SUBCUTANEOUS
  Administered 2013-09-25 – 2013-09-30 (×14): 2 [IU] via SUBCUTANEOUS
  Administered 2013-09-30: 3 [IU] via SUBCUTANEOUS
  Administered 2013-09-30: 2 [IU] via SUBCUTANEOUS
  Administered 2013-10-01: 10:00:00 via SUBCUTANEOUS
  Administered 2013-10-01: 3 [IU] via SUBCUTANEOUS
  Administered 2013-10-01: 5 [IU] via SUBCUTANEOUS
  Administered 2013-10-02: 3 [IU] via SUBCUTANEOUS

## 2013-09-22 MED ORDER — SODIUM CHLORIDE 0.9 % IJ SOLN
3.0000 mL | Freq: Two times a day (BID) | INTRAMUSCULAR | Status: DC
  Administered 2013-09-22 – 2013-10-05 (×7): 3 mL via INTRAVENOUS

## 2013-09-22 MED ORDER — METOCLOPRAMIDE HCL 5 MG/ML IJ SOLN
10.0000 mg | Freq: Four times a day (QID) | INTRAMUSCULAR | Status: DC
  Administered 2013-09-22 – 2013-10-02 (×40): 10 mg via INTRAVENOUS
  Filled 2013-09-22 (×50): qty 2

## 2013-09-22 MED ORDER — ENALAPRILAT 1.25 MG/ML IV SOLN
1.2500 mg | Freq: Four times a day (QID) | INTRAVENOUS | Status: DC
Start: 2013-09-22 — End: 2013-09-30
  Administered 2013-09-22 – 2013-09-30 (×34): 1.25 mg via INTRAVENOUS
  Filled 2013-09-22 (×40): qty 1

## 2013-09-22 MED ORDER — ONDANSETRON HCL 4 MG PO TABS: 4.0000 mg | ORAL_TABLET | Freq: Four times a day (QID) | ORAL | Status: DC | PRN

## 2013-09-22 MED ORDER — INSULIN ASPART 100 UNIT/ML ~~LOC~~ SOLN: 0.0000 [IU] | Freq: Every day | SUBCUTANEOUS | Status: DC

## 2013-09-22 MED ORDER — CHLORPROMAZINE HCL 25 MG/ML IJ SOLN
25.0000 mg | Freq: Three times a day (TID) | INTRAMUSCULAR | Status: DC | PRN
  Administered 2013-09-28 – 2013-09-29 (×2): 25 mg via INTRAMUSCULAR
  Filled 2013-09-22 (×4): qty 1

## 2013-09-22 MED ORDER — METOPROLOL TARTRATE 1 MG/ML IV SOLN
2.5000 mg | Freq: Four times a day (QID) | INTRAVENOUS | Status: DC
  Administered 2013-09-22 – 2013-09-24 (×11): 2.5 mg via INTRAVENOUS
  Filled 2013-09-22 (×15): qty 5

## 2013-09-22 MED ORDER — HYDROMORPHONE HCL PF 1 MG/ML IJ SOLN
1.0000 mg | INTRAMUSCULAR | Status: DC | PRN
  Administered 2013-09-22 – 2013-09-28 (×47): 1 mg via INTRAVENOUS
  Filled 2013-09-22 (×47): qty 1

## 2013-09-22 MED ORDER — HYDROMORPHONE HCL PF 1 MG/ML IJ SOLN
1.0000 mg | Freq: Once | INTRAMUSCULAR | Status: AC
  Administered 2013-09-22: 1 mg via INTRAVENOUS
  Filled 2013-09-22: qty 1

## 2013-09-22 MED ORDER — ONDANSETRON HCL 4 MG/2ML IJ SOLN
4.0000 mg | Freq: Four times a day (QID) | INTRAMUSCULAR | Status: DC | PRN
  Administered 2013-09-22 – 2013-10-04 (×34): 4 mg via INTRAVENOUS
  Filled 2013-09-22 (×35): qty 2

## 2013-09-22 MED ORDER — MORPHINE SULFATE 4 MG/ML IJ SOLN
4.0000 mg | Freq: Once | INTRAMUSCULAR | Status: AC
  Administered 2013-09-22: 4 mg via INTRAVENOUS
  Filled 2013-09-22: qty 1

## 2013-09-22 MED ORDER — DOCUSATE SODIUM 100 MG PO CAPS
100.0000 mg | ORAL_CAPSULE | Freq: Two times a day (BID) | ORAL | Status: DC
  Administered 2013-09-23 – 2013-10-06 (×15): 100 mg via ORAL
  Filled 2013-09-22 (×31): qty 1

## 2013-09-22 MED ORDER — PROMETHAZINE HCL 25 MG/ML IJ SOLN
25.0000 mg | Freq: Once | INTRAMUSCULAR | Status: AC
  Administered 2013-09-22: 25 mg via INTRAVENOUS
  Filled 2013-09-22: qty 1

## 2013-09-22 MED ORDER — ASPIRIN EC 81 MG PO TBEC
81.0000 mg | DELAYED_RELEASE_TABLET | Freq: Every day | ORAL | Status: DC
  Administered 2013-09-23 – 2013-10-06 (×10): 81 mg via ORAL
  Filled 2013-09-22 (×16): qty 1

## 2013-09-22 NOTE — H&P (Signed)
Duane [name] is an 38 y.o. male.   Chief Complaint: Nausea and vomiting HPI: Pt is a 38 yr old man who has chronic diabetic gastroparesis.He states that this time he started having intractable nausea and vomiting on 8/10. He came to the [facility] ED and was here until 0600 receiving IV fluids and antiemetics. It seemed that his vomiting had stopped and he was discharged to home. He states that the vomiting started again not long after he got home. He complains of severe hiccoughs and abdominal pain.   Past Medical History  Diagnosis Date  . Hypertension   . Diabetes mellitus without complication   . Gastritis   . Gastroparesis     Past Surgical History  Procedure Laterality Date  . Cholecystectomy    . Knee surgery Left   . Flexible sigmoidoscopy N/A 06/11/2013    Procedure: FLEXIBLE SIGMOIDOSCOPY;  Surgeon: [name], MD;  Location: [facility];  Service: Endoscopy;  Laterality: N/A;  . Esophagogastroduodenoscopy N/A 06/11/2013    Procedure: ESOPHAGOGASTRODUODENOSCOPY (EGD);  Surgeon: [name], MD;  Location: [facility];  Service: Endoscopy;  Laterality: N/A;    History reviewed. No pertinent family history. Social History:  reports that he has never smoked. He has never used smokeless tobacco. He reports that he drinks alcohol. He reports that he does not use illicit drugs.  Allergies: No Known Allergies  Medications Prior to Admission  Medication Sig Dispense Refill  . acetaminophen (TYLENOL) 500 MG tablet Take 500 mg by mouth every 6 (six) hours as needed for mild pain.       Marland Kitchen amLODipine (NORVASC) 10 MG tablet Take 1 tablet (10 mg total) by mouth every morning.  30 tablet  3  . cloNIDine (CATAPRES) 0.1 MG tablet Take 1 tablet (0.1 mg total) by mouth 2 (two) times daily.  60 tablet  3  . gabapentin (NEURONTIN) 300 MG capsule Take 1 capsule (300 mg total) by mouth at bedtime.  30 capsule  3  . insulin aspart (NOVOLOG) 100 UNIT/ML injection Inject 9-12 Units into the  skin 3 (three) times daily before meals. SSI  10 mL  11  . insulin glargine (LANTUS) 100 UNIT/ML injection Inject 22 Units into the skin at bedtime.       . metoCLOPramide (REGLAN) 10 MG tablet Take 10 mg by mouth 4 (four) times daily.      . metoprolol (LOPRESSOR) 50 MG tablet Take 50 mg by mouth 2 (two) times daily.       . pantoprazole (PROTONIX) 40 MG tablet Take 1 tablet (40 mg total) by mouth every morning.  30 tablet  3  . promethazine (PHENERGAN) 25 MG tablet Take 1 tablet (25 mg total) by mouth every 8 (eight) hours as needed for nausea or vomiting.  15 tablet  0  . sucralfate (CARAFATE) 1 G tablet Take 1 g by mouth 4 (four) times daily -  before meals and at bedtime.      . traMADol (ULTRAM) 50 MG tablet Take 50 mg by mouth every 6 (six) hours as needed for moderate pain.         Results for orders placed during the hospital encounter of 09/21/13 (from the past 48 hour(s))  CBC WITH DIFFERENTIAL     Status: Abnormal   Collection Time    09/21/13 11:55 PM      Result Value Ref Range   WBC 8.7  4.0 - 10.5 K/uL   RBC 5.03  4.22 - 5.81  MIL/uL   Hemoglobin 13.7  13.0 - 17.0 g/dL   HCT 40.3  39.0 - 52.0 %   MCV 80.1  78.0 - 100.0 fL   MCH 27.2  26.0 - 34.0 pg   MCHC 34.0  30.0 - 36.0 g/dL   RDW 12.4  11.5 - 15.5 %   Platelets 245  150 - 400 K/uL   Neutrophils Relative % 81 (*) 43 - 77 %   Neutro Abs 7.0  1.7 - 7.7 K/uL   Lymphocytes Relative 16  12 - 46 %   Lymphs Abs 1.4  0.7 - 4.0 K/uL   Monocytes Relative 3  3 - 12 %   Monocytes Absolute 0.3  0.1 - 1.0 K/uL   Eosinophils Relative 0  0 - 5 %   Eosinophils Absolute 0.0  0.0 - 0.7 K/uL   Basophils Relative 0  0 - 1 %   Basophils Absolute 0.0  0.0 - 0.1 K/uL  COMPREHENSIVE METABOLIC PANEL     Status: Abnormal   Collection Time    09/21/13 11:57 PM      Result Value Ref Range   Sodium 138  137 - 147 mEq/L   Potassium 4.1  3.7 - 5.3 mEq/L   Chloride 98  96 - 112 mEq/L   CO2 23  19 - 32 mEq/L   Glucose, Bld 247 (*) 70 - 99  mg/dL   BUN 13  6 - 23 mg/dL   Creatinine, Ser 1.22  0.50 - 1.35 mg/dL   Calcium 9.4  8.4 - 10.5 mg/dL   Total Protein 7.6  6.0 - 8.3 g/dL   Albumin 4.2  3.5 - 5.2 g/dL   AST 17  0 - 37 U/L   ALT 14  0 - 53 U/L   Alkaline Phosphatase 86  39 - 117 U/L   Total Bilirubin 0.7  0.3 - 1.2 mg/dL   GFR calc non Af Amer 75 (*) >90 mL/min   GFR calc Af Amer 87 (*) >90 mL/min   Comment: (NOTE)     The eGFR has been calculated using the CKD EPI equation.     This calculation has not been validated in all clinical situations.     eGFR's persistently <90 mL/min signify possible Chronic Kidney     Disease.   Anion gap 17 (*) 5 - 15  LIPASE, BLOOD     Status: None   Collection Time    09/21/13 11:57 PM      Result Value Ref Range   Lipase 19  11 - 59 U/L   Dg Abd Acute W/chest  09/22/2013   CLINICAL DATA:  Nausea, vomiting and upper abdominal pain.  EXAM: ACUTE ABDOMEN SERIES (ABDOMEN 2 VIEW & CHEST 1 VIEW)  COMPARISON:  Chest and abdominal radiographs performed 08/17/2013  FINDINGS: The lungs are well-aerated and clear. There is no evidence of focal opacification, pleural effusion or pneumothorax. The cardiomediastinal silhouette is within normal limits.  The visualized bowel gas pattern is unremarkable. Scattered stool and air are seen within the colon; there is no evidence of small bowel dilatation to suggest obstruction. No free intra-abdominal air is identified on the provided upright view. Clips are noted within the right upper quadrant, reflecting prior cholecystectomy.  No acute osseous abnormalities are seen; the sacroiliac joints are unremarkable in appearance.  IMPRESSION: 1. Unremarkable bowel gas pattern; no free intra-abdominal air seen. Moderate amount of stool noted in the colon. 2. No acute cardiopulmonary process identified.  Electronically Signed   By: Garald Balding M.D.   On: 09/22/2013 01:22    Review of Systems  Constitutional: Positive for malaise/fatigue. Negative for fever,  chills, weight loss and diaphoresis.  HENT: Negative for congestion and sore throat.   Eyes: Negative for blurred vision, double vision and redness.  Respiratory: Negative for cough, hemoptysis, sputum production, shortness of breath and wheezing.   Cardiovascular: Negative for chest pain, palpitations, orthopnea and leg swelling.  Gastrointestinal: Positive for nausea, abdominal pain and constipation. Negative for heartburn, diarrhea, blood in stool and melena.  Genitourinary: Negative for dysuria, urgency, frequency, hematuria and flank pain.  Musculoskeletal: Negative for joint pain, myalgias and neck pain.  Skin: Negative for itching and rash.  Neurological: Positive for weakness. Negative for dizziness, tremors, seizures, loss of consciousness and headaches.  Endo/Heme/Allergies: Negative for environmental allergies and polydipsia. Does not bruise/bleed easily.  Psychiatric/Behavioral: Negative for depression and substance abuse. The patient is not nervous/anxious.     Blood pressure 159/101, pulse 108, temperature 99.1 F (37.3 C), temperature source Oral, resp. rate 16, height _0  (1.93 m), SpO2 100.00%. Physical Exam  Constitutional: He is oriented to person, place, and time. He appears well-developed and well-nourished. He appears distressed.  Pt is awake, alert, and oriented x 3.   HENT:  Head: Normocephalic and atraumatic.  Nose: Nose normal.  Mouth/Throat: No oropharyngeal exudate.  Eyes: Conjunctivae and EOM are normal. Pupils are equal, round, and reactive to light. Left eye exhibits no discharge. No scleral icterus.  Neck: Neck supple. No JVD present. No tracheal deviation present. No thyromegaly present.  Cardiovascular: Normal rate, regular rhythm, normal heart sounds and intact distal pulses.  Exam reveals no gallop and no friction rub.   No murmur heard. Respiratory: Effort normal and breath sounds normal. No respiratory distress. He has no wheezes. He has no rales. He  exhibits no tenderness.  GI: Soft. He exhibits distension. He exhibits no mass. There is no tenderness. There is no rebound and no guarding.  Hypoactive bowel sounds.  Musculoskeletal: He exhibits no edema and no tenderness.  Lymphadenopathy:    He has no cervical adenopathy.  Neurological: He is alert and oriented to person, place, and time. He has normal reflexes. He displays normal reflexes. No cranial nerve deficit. He exhibits normal muscle tone. Coordination normal.  Skin: Skin is warm and dry. No rash noted. He is not diaphoretic. No erythema. No pallor.  Psychiatric: He has a normal mood and affect. His behavior is normal. Judgment and thought content normal.     Assessment/Plan 1, Intractable nausea and vomiting - Reglan, NPO, IV fluids. 2. Diabetic gastroparesis. Control glucoses with FSBS and SSI. 3. Pain control, but must be cautious with use of narcotics as it runs the risk of worsening the patient's gastroparesis. 4. Hypertension - Pt will be admitted to a telemetry bed as he will require IV antihypertensives due to his nausea and vomiting.  I have spent 58 minutes on this patient's admission and care.  Jacquita Mulhearn 09/22/2013, 3:23 AM

## 2013-09-22 NOTE — Progress Notes (Signed)
UR Completed.  Cayce 09/22/2013 Vergie Living

## 2013-09-22 NOTE — Progress Notes (Signed)
Pt arrived to floor room 1519 via wheelchair. VS taken pt oriented to room and callbell with no complications. Gait unsteady w/ general weakness, pain 5/10 and actively vomitting. Initial assessment completed. Will continue to monitor and provide interventions as appropriate.

## 2013-09-22 NOTE — ED Notes (Signed)
Pt unable to provide urine sample, will ask again at 0130

## 2013-09-22 NOTE — Progress Notes (Signed)
Patient ID: Duane [name], male   DOB: 1975/12/23, 38 y.o.   MRN: [id number] [facility] PROGRESS NOTE  Demarius Alvira [id number] DOB: 1975-05-28 DOA: 09/21/2013 PCP: [name], MD  Brief narrative: 38 y.o. male with a PMH of type 1 diabetes, diabetic gastroparesis confirmed by gastric emptying study, uncontrolled hypertension, multiple admissions for abdominal pain related to gastroparesis who presented to [facility] ED 09/21/2013 with intractable nausea, vomiting for past 24 hours prior to this admission.  Assessment/Plan:    Principal Problem:  Diabetic Gastroparesis with intractable nausea and vomiting    Placed on bowel rest, scheduled Reglan 10 mg every 6 hours IV, pain control with Dilaudid 1 mg every 2 hours IV as needed.  Order placed for Zofran as needed  Continue IV fluids.  Please note that patient had a gastric emptying study done 08/18/2013 confirming gastroparesis  Gastric emptying study done 08/18/13 confirming gastroparesis.   A1c in July 2015 9.2 indicating poor glycemic control. Active Problems:  IDDM (insulin dependent diabetes mellitus) with neurological complications / diabetic neuropathy A1c in July 2000 15.9.2 indicating poor glycemic control. Continue NovoLog 9-12 units 3 times a day with meals along with sliding scale insulin. Continue Neurontin for diabetic neuropathy.  HTN (hypertension)  Continue Enalaprilat and metoprolol  DVT Prophylaxis   SCD's bilaterally    Code Status: Full.  Family Communication:  plan of care discussed with the patient Disposition Plan: Home when stable.    IV Access:   Peripheral IV  Procedures and diagnostic studies:    Dg Abd Acute W/chest  09/22/2013  1. Unremarkable bowel gas pattern; no free intra-abdominal air seen. Moderate amount of stool noted in the colon. 2. No acute cardiopulmonary process identified.     Medical Consultants:   None  Other Consultants:   None  Anti-Infectives:   None    Leisa Lenz, MD  [facility] Pager [phone]  If 7PM-7AM, please contact night-coverage [email or web address] Password [email or web address] 09/22/2013, 8:15 AM   LOS: 1 day    HPI/Subjective: No acute overnight events.  Objective: Filed Vitals:   09/22/13 0158 09/22/13 0219 09/22/13 0425 09/22/13 0700  BP: 109/66 159/101 183/102 114/65  Pulse: 78 108 107 85  Temp: 98.4 F (36.9 C) 99.1 F (37.3 C) 98.4 F (36.9 C) 98.2 F (36.8 C)  TempSrc: Oral Oral Oral Oral  Resp: 18 16 18 18   Height:  6\' 4"  (1.93 m)    Weight:   124.6 kg (274 lb 11.1 oz)   SpO2: 98% 100% 97% 98%    Intake/Output Summary (Last 24 hours) at 09/22/13 0815 Last data filed at 09/22/13 0122  Gross per 24 hour  Intake   1000 ml  Output      0 ml  Net   1000 ml    Exam:   General:  Pt is alert, follows commands appropriately, not in acute distress  Cardiovascular: Regular rate and rhythm, S1/S2, no murmurs  Respiratory: Clear to auscultation bilaterally, no wheezing, no crackles, no rhonchi  Abdomen: Soft, non tender, non distended, bowel sounds present  Extremities: No edema, pulses DP and PT palpable bilaterally  Neuro: Grossly nonfocal  Data Reviewed: Basic Metabolic Panel:  Recent Labs Lab 09/20/13 2041 09/21/13 2357 09/22/13 0535  NA 141 138 139  K 4.2 4.1 4.2  CL 98 98 102  CO2 20 23 22   GLUCOSE 288* 247* 195*  BUN 16 13 12   CREATININE 1.23 1.22 1.11  CALCIUM 10.5 9.4 8.8   Liver Function Tests:  Recent Labs Lab 09/20/13 2041 09/21/13 2357  AST 16 17  ALT 14 14  ALKPHOS 120* 86  BILITOT 0.5 0.7  PROT 9.0* 7.6  ALBUMIN 5.1 4.2    Recent Labs Lab 09/21/13 2357  LIPASE 19   No results found for this basename: AMMONIA,  in the last 168 hours CBC:  Recent Labs Lab 09/20/13 2041 09/21/13 2355 09/22/13 0535  WBC 10.6* 8.7 7.2  NEUTROABS 8.7* 7.0  --   HGB 15.1 13.7 12.3*  HCT 44.6 40.3 35.7*  MCV 77.8* 80.1 78.3  PLT 243 245 PLATELET CLUMPS NOTED ON SMEAR, COUNT APPEARS  ADEQUATE   Cardiac Enzymes: No results found for this basename: CKTOTAL, CKMB, CKMBINDEX, TROPONINI,  in the last 168 hours BNP: No components found with this basename: POCBNP,  CBG:  Recent Labs Lab 09/22/13 0743  GLUCAP 162*    No results found for this or any previous visit (from the past 240 hour(s)).   Scheduled Meds: . aspirin EC  81 mg Oral Daily  . docusate sodium  100 mg Oral BID  . enalaprilat  1.25 mg Intravenous 4 times per day  . insulin aspart  0-5 Units Subcutaneous QHS  . insulin aspart  0-9 Units Subcutaneous TID WC  . insulin aspart  9-12 Units Subcutaneous TID AC  . metoCLOPramide (REGLAN) injection  10 mg Intravenous 4 times per day  . metoprolol  2.5 mg Intravenous 4 times per day   Continuous Infusions: . sodium chloride 125 mL/hr at 09/22/13 0458

## 2013-09-23 DIAGNOSIS — K5900 Constipation, unspecified: Secondary | ICD-10-CM | POA: Diagnosis present

## 2013-09-23 LAB — GLUCOSE, CAPILLARY
GLUCOSE-CAPILLARY: 121 mg/dL — AB (ref 70–99)
Glucose-Capillary: 154 mg/dL — ABNORMAL HIGH (ref 70–99)
Glucose-Capillary: 128 mg/dL — ABNORMAL HIGH (ref 70–99)
Glucose-Capillary: 156 mg/dL — ABNORMAL HIGH (ref 70–99)
Glucose-Capillary: 154 mg/dL — ABNORMAL HIGH (ref 70–99)

## 2013-09-23 MED ORDER — PROMETHAZINE HCL 25 MG/ML IJ SOLN
25.0000 mg | Freq: Four times a day (QID) | INTRAMUSCULAR | Status: DC | PRN
  Administered 2013-09-23 – 2013-09-29 (×18): 25 mg via INTRAVENOUS
  Filled 2013-09-23 (×19): qty 1

## 2013-09-23 NOTE — Progress Notes (Signed)
[facility] PROGRESS NOTE  Duane [name] [id number] DOB: [birth year] DOA: 09/21/2013 PCP: [name], MD  Assessment/Plan: #1 nausea /vomiting/ abdominal pain/gastroparesis Likely secondary to gastroparesis. Patient with some clinical improvement. Acute abdominal series negative for any acute abnormalities however did show a moderate stool in the colon. Continue IV fluids. Place on clear liquid diet. Anti-emetics. Pain management. Continue IV Reglan. Will give a soapsuds enema x1. Follow.  #2 constipation Continue Colace. Will give a soapsuds enema.  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 CBGs currently ranging from 121-154. Continue SSI.  #4 hypertension Stable. Continue IV enalapril and lopressor.  #5 prophylaxis SCDs for DVT prophylaxis.    Code Status: Full Family Communication: Updated patient no family at bedside. Disposition Plan: Home when medically stable.   Consultants:  None  Procedures:  Acute abdominal series 09/22/2013  Antibiotics:  None  HPI/Subjective: Patient still with some nausea however feeling a little bit better. No emesis. Abdominal pain improving. Patient will like to try some clear liquids.  Objective: Filed Vitals:   09/23/13 0553  BP: 148/81  Pulse: 88  Temp: 98.6 F (37 C)  Resp: 18    Intake/Output Summary (Last 24 hours) at 09/23/13 1426 Last data filed at 09/23/13 0800  Gross per 24 hour  Intake 1819.67 ml  Output    550 ml  Net 1269.67 ml   Filed Weights   09/22/13 0425  Weight: 124.6 kg (274 lb 11.1 oz)    Exam:   General:  NAD  Cardiovascular: RRR  Respiratory: CTAB  Abdomen: Soft, nontender, nondistended, positive bowel sounds.  Musculoskeletal: No clubbing cyanosis or edema  Data Reviewed: Basic Metabolic Panel:  Recent Labs Lab 09/20/13 2041 09/21/13 2357 09/22/13 0535  NA 141 138 139  K 4.2 4.1  4.2  CL 98 98 102  CO2 20 23 22   GLUCOSE 288* 247* 195*  BUN 16 13 12   CREATININE 1.23 1.22 1.11  CALCIUM 10.5 9.4 8.8   Liver Function Tests:  Recent Labs Lab 09/20/13 2041 09/21/13 2357  AST 16 17  ALT 14 14  ALKPHOS 120* 86  BILITOT 0.5 0.7  PROT 9.0* 7.6  ALBUMIN 5.1 4.2    Recent Labs Lab 09/21/13 2357  LIPASE 19   No results found for this basename: AMMONIA,  in the last 168 hours CBC:  Recent Labs Lab 09/20/13 2041 09/21/13 2355 09/22/13 0535  WBC 10.6* 8.7 7.2  NEUTROABS 8.7* 7.0  --   HGB 15.1 13.7 12.3*  HCT 44.6 40.3 35.7*  MCV 77.8* 80.1 78.3  PLT 243 245 PLATELET CLUMPS NOTED ON SMEAR, COUNT APPEARS ADEQUATE   Cardiac Enzymes: No results found for this basename: CKTOTAL, CKMB, CKMBINDEX, TROPONINI,  in the last 168 hours BNP (last 3 results) No results found for this basename: PROBNP,  in the last 8760 hours CBG:  Recent Labs Lab 09/22/13 1144 09/22/13 1617 09/22/13 2138 09/23/13 0747 09/23/13 1200  GLUCAP 122* 139* 154* 121* 128*    No results found for this or any previous visit (from the past 240 hour(s)).   Studies: Dg Abd Acute W/chest  09/22/2013   CLINICAL DATA:  Nausea, vomiting and upper abdominal pain.  EXAM: ACUTE ABDOMEN SERIES (ABDOMEN 2 VIEW & CHEST 1 VIEW)  COMPARISON:  Chest and abdominal radiographs performed 08/17/2013  FINDINGS: The lungs are well-aerated and clear. There is no evidence of focal opacification, pleural effusion or pneumothorax. The cardiomediastinal silhouette is within normal limits.  The  visualized bowel gas pattern is unremarkable. Scattered stool and air are seen within the colon; there is no evidence of small bowel dilatation to suggest obstruction. No free intra-abdominal air is identified on the provided upright view. Clips are noted within the right upper quadrant, reflecting prior cholecystectomy.  No acute osseous abnormalities are seen; the sacroiliac joints are unremarkable in appearance.   IMPRESSION: 1. Unremarkable bowel gas pattern; no free intra-abdominal air seen. Moderate amount of stool noted in the colon. 2. No acute cardiopulmonary process identified.   Electronically Signed   By: Garald Balding M.D.   On: 09/22/2013 01:22    Scheduled Meds: . aspirin EC  81 mg Oral Daily  . docusate sodium  100 mg Oral BID  . enalaprilat  1.25 mg Intravenous 4 times per day  . insulin aspart  0-5 Units Subcutaneous QHS  . insulin aspart  0-9 Units Subcutaneous TID WC  . insulin aspart  9-12 Units Subcutaneous TID AC  . metoCLOPramide (REGLAN) injection  10 mg Intravenous 4 times per day  . metoprolol  2.5 mg Intravenous 4 times per day  . sodium chloride  3 mL Intravenous Q12H   Continuous Infusions: . sodium chloride 125 mL/hr (09/23/13 1111)    Principal Problem:   Intractable nausea and vomiting Active Problems:   Diabetic gastroparesis   IDDM (insulin dependent diabetes mellitus)   HTN (hypertension)   Diabetic neuropathy   Unspecified constipation    Time spent: 73 mins    [facility] MD [facility] Pager [phone]. If 7PM-7AM, please contact night-coverage at [email or web address], password [facility] 09/23/2013, 2:26 PM  LOS: 2 days

## 2013-09-24 LAB — CBC
HCT: 36.1 % — ABNORMAL LOW (ref 39.0–52.0)
HEMOGLOBIN: 12.2 g/dL — AB (ref 13.0–17.0)
MCH: 26.6 pg (ref 26.0–34.0)
MCHC: 33.8 g/dL (ref 30.0–36.0)
MCV: 78.6 fL (ref 78.0–100.0)
Platelets: 242 10*3/uL (ref 150–400)
RBC: 4.59 MIL/uL (ref 4.22–5.81)
RDW: 12 % (ref 11.5–15.5)
WBC: 6.6 10*3/uL (ref 4.0–10.5)

## 2013-09-24 LAB — GLUCOSE, CAPILLARY
Glucose-Capillary: 145 mg/dL — ABNORMAL HIGH (ref 70–99)
Glucose-Capillary: 135 mg/dL — ABNORMAL HIGH (ref 70–99)
Glucose-Capillary: 181 mg/dL — ABNORMAL HIGH (ref 70–99)
Glucose-Capillary: 171 mg/dL — ABNORMAL HIGH (ref 70–99)

## 2013-09-24 LAB — BASIC METABOLIC PANEL
Anion gap: 14 (ref 5–15)
BUN: 11 mg/dL (ref 6–23)
CHLORIDE: 103 meq/L (ref 96–112)
CO2: 24 mEq/L (ref 19–32)
CREATININE: 1.12 mg/dL (ref 0.50–1.35)
Calcium: 8.8 mg/dL (ref 8.4–10.5)
GFR calc Af Amer: >90 mL/min (ref >90)
GFR, EST NON AFRICAN AMERICAN: 82 mL/min — AB (ref >90)
GLUCOSE: 144 mg/dL — AB (ref 70–99)
POTASSIUM: 3.9 meq/L (ref 3.7–5.3)
Sodium: 141 mEq/L (ref 137–147)

## 2013-09-24 MED ORDER — PANTOPRAZOLE SODIUM 40 MG PO TBEC
40.0000 mg | DELAYED_RELEASE_TABLET | Freq: Every day | ORAL | Status: DC
  Administered 2013-09-27 – 2013-09-29 (×2): 40 mg via ORAL
  Filled 2013-09-24 (×5): qty 1

## 2013-09-24 MED ORDER — ACETAMINOPHEN 325 MG PO TABS
650.0000 mg | ORAL_TABLET | Freq: Four times a day (QID) | ORAL | Status: DC | PRN
  Filled 2013-09-24: qty 2

## 2013-09-24 MED ORDER — GI COCKTAIL ~~LOC~~
30.0000 mL | Freq: Three times a day (TID) | ORAL | Status: DC | PRN
  Administered 2013-09-29 – 2013-10-01 (×3): 30 mL via ORAL
  Filled 2013-09-24 (×3): qty 30

## 2013-09-24 MED ORDER — METOPROLOL TARTRATE 1 MG/ML IV SOLN
5.0000 mg | Freq: Four times a day (QID) | INTRAVENOUS | Status: DC
  Administered 2013-09-24 – 2013-09-30 (×23): 5 mg via INTRAVENOUS
  Filled 2013-09-24 (×27): qty 5

## 2013-09-24 NOTE — Progress Notes (Signed)
[facility] PROGRESS NOTE  Duane [name] [id number] DOB: 08-03-1975 DOA: 09/21/2013 PCP: [name], MD  Assessment/Plan: #1 nausea /vomiting/ abdominal pain/gastroparesis Likely secondary to gastroparesis. Patient with some clinical improvement. Acute abdominal series negative for any acute abnormalities however did show a moderate stool in the colon. Continue IV fluids. Place on clear liquid diet. Anti-emetics. Pain management. Continue IV Reglan. Will give a soapsuds enema x1. Follow.  #2 constipation Continue Colace. Will give another soapsuds enema.  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 CBGs currently ranging from 135-145. Continue SSI.  #4 hypertension Stable. Continue IV enalapril and lopressor.  #5 prophylaxis SCDs for DVT prophylaxis.    Code Status: Full Family Communication: Updated patient and mother at bedside. Disposition Plan: Home when medically stable.   Consultants:  None  Procedures:  Acute abdominal series 09/22/2013  Antibiotics:  None  HPI/Subjective: Patient still with some nausea however feeling a little bit better. No emesis. Abdominal pain improving. Patient will like to try some clear liquids.  Objective: Filed Vitals:   09/24/13 0520  BP: 147/90  Pulse: 97  Temp: 98.5 F (36.9 C)  Resp:     Intake/Output Summary (Last 24 hours) at 09/24/13 1233 Last data filed at 09/24/13 0530  Gross per 24 hour  Intake   4250 ml  Output   1202 ml  Net   3048 ml   Filed Weights   09/22/13 0425  Weight: 124.6 kg (274 lb 11.1 oz)    Exam:   General:  NAD  Cardiovascular: RRR  Respiratory: CTAB  Abdomen: Soft, some tenderness to palpation, nondistended, positive bowel sounds.  Musculoskeletal: No clubbing cyanosis or edema  Data Reviewed: Basic Metabolic Panel:  Recent Labs Lab 09/20/13 2041 09/21/13 2357 09/22/13 0535  09/24/13 0520  NA 141 138 139 141  K 4.2 4.1 4.2 3.9  CL 98 98 102 103  CO2 20 23 22 24   GLUCOSE 288* 247* 195* 144*  BUN 16 13 12 11   CREATININE 1.23 1.22 1.11 1.12  CALCIUM 10.5 9.4 8.8 8.8   Liver Function Tests:  Recent Labs Lab 09/20/13 2041 09/21/13 2357  AST 16 17  ALT 14 14  ALKPHOS 120* 86  BILITOT 0.5 0.7  PROT 9.0* 7.6  ALBUMIN 5.1 4.2    Recent Labs Lab 09/21/13 2357  LIPASE 19   No results found for this basename: AMMONIA,  in the last 168 hours CBC:  Recent Labs Lab 09/20/13 2041 09/21/13 2355 09/22/13 0535 09/24/13 0520  WBC 10.6* 8.7 7.2 6.6  NEUTROABS 8.7* 7.0  --   --   HGB 15.1 13.7 12.3* 12.2*  HCT 44.6 40.3 35.7* 36.1*  MCV 77.8* 80.1 78.3 78.6  PLT 243 245 PLATELET CLUMPS NOTED ON SMEAR, COUNT APPEARS ADEQUATE 242   Cardiac Enzymes: No results found for this basename: CKTOTAL, CKMB, CKMBINDEX, TROPONINI,  in the last 168 hours BNP (last 3 results) No results found for this basename: PROBNP,  in the last 8760 hours CBG:  Recent Labs Lab 09/23/13 1200 09/23/13 1657 09/23/13 2302 09/24/13 0755 09/24/13 1200  GLUCAP 128* 156* 154* 145* 135*    No results found for this or any previous visit (from the past 240 hour(s)).   Studies: No results found.  Scheduled Meds: . aspirin EC  81 mg Oral Daily  . docusate sodium  100 mg Oral BID  . enalaprilat  1.25 mg Intravenous 4 times per day  . insulin aspart  0-5  Units Subcutaneous QHS  . insulin aspart  0-9 Units Subcutaneous TID WC  . metoCLOPramide (REGLAN) injection  10 mg Intravenous 4 times per day  . metoprolol  2.5 mg Intravenous 4 times per day  . sodium chloride  3 mL Intravenous Q12H   Continuous Infusions: . sodium chloride 125 mL/hr at 09/24/13 0800    Principal Problem:   Intractable nausea and vomiting Active Problems:   Diabetic gastroparesis   IDDM (insulin dependent diabetes mellitus)   HTN (hypertension)   Diabetic neuropathy   Unspecified  constipation    Time spent: 51 mins    [facility] MD [facility] Pager [phone]. If 7PM-7AM, please contact night-coverage at [email or web address], password [facility] 09/24/2013, 12:33 PM  LOS: 3 days

## 2013-09-25 ENCOUNTER — Inpatient Hospital Stay (HOSPITAL_COMMUNITY): Payer: BC Managed Care – PPO

## 2013-09-25 DIAGNOSIS — K5900 Constipation, unspecified: Secondary | ICD-10-CM

## 2013-09-25 LAB — GLUCOSE, CAPILLARY
GLUCOSE-CAPILLARY: 166 mg/dL — AB (ref 70–99)
GLUCOSE-CAPILLARY: 171 mg/dL — AB (ref 70–99)
GLUCOSE-CAPILLARY: 175 mg/dL — AB (ref 70–99)
Glucose-Capillary: 165 mg/dL — ABNORMAL HIGH (ref 70–99)

## 2013-09-25 MED ORDER — SORBITOL 70 % SOLN
960.0000 mL | TOPICAL_OIL | Freq: Once | ORAL | Status: AC
  Administered 2013-09-26: 960 mL via RECTAL
  Filled 2013-09-25: qty 240

## 2013-09-25 NOTE — ED Provider Notes (Signed)
Medical screening examination/treatment/procedure(s) were performed by non-physician practitioner and as supervising physician I was immediately available for consultation/collaboration.   Dorie Rank, MD 09/25/13 [phone]

## 2013-09-25 NOTE — Progress Notes (Signed)
[facility] PROGRESS NOTE  Duane [name] [id number] DOB: 10-15-1975 DOA: 09/21/2013 PCP: [name], MD  Assessment/Plan: #1 nausea /vomiting/ abdominal pain/gastroparesis Likely secondary to gastroparesis. Patient with no significant improvement. Acute abdominal series negative for any acute abnormalities however did show a moderate stool in the colon. Continue IV fluids.Bowel rest.  Anti-emetics. Pain management. Continue IV Reglan. Patient has been refusing an enema. Patient states did not help the last time he was hospitalized. Will try a  SMOG enema. Repeat abdominal films. Follow.  #2 constipation Continue Colace. Will give SMOG enema.   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 CBGs currently ranging from 166-171. Continue SSI.  #4 hypertension Stable. Continue IV enalapril and lopressor.  #5 prophylaxis SCDs for DVT prophylaxis.    Code Status: Full Family Communication: Updated patient and mother at bedside. Disposition Plan: Home when medically stable.   Consultants:  None  Procedures:  Acute abdominal series 09/22/2013  Antibiotics:  None  HPI/Subjective: Patient still with some nausea and emesis. Patient still with abdominal pain. Patient with no BM. Patient refusing enema stating didn't work the last time he was hospitalized.  Objective: Filed Vitals:   09/25/13 0818  BP: 180/93  Pulse: 102  Temp: 98.9 F (37.2 C)  Resp: 19    Intake/Output Summary (Last 24 hours) at 09/25/13 1353 Last data filed at 09/25/13 0900  Gross per 24 hour  Intake   1375 ml  Output    650 ml  Net    725 ml   Filed Weights   09/22/13 0425  Weight: 124.6 kg (274 lb 11.1 oz)    Exam:   General:  NAD  Cardiovascular: RRR  Respiratory: CTAB  Abdomen: Soft, some tenderness to palpation, nondistended, hypoactive bowel sounds.  Musculoskeletal: No clubbing cyanosis or  edema  Data Reviewed: Basic Metabolic Panel:  Recent Labs Lab 09/20/13 2041 09/21/13 2357 09/22/13 0535 09/24/13 0520  NA 141 138 139 141  K 4.2 4.1 4.2 3.9  CL 98 98 102 103  CO2 20 23 22 24   GLUCOSE 288* 247* 195* 144*  BUN 16 13 12 11   CREATININE 1.23 1.22 1.11 1.12  CALCIUM 10.5 9.4 8.8 8.8   Liver Function Tests:  Recent Labs Lab 09/20/13 2041 09/21/13 2357  AST 16 17  ALT 14 14  ALKPHOS 120* 86  BILITOT 0.5 0.7  PROT 9.0* 7.6  ALBUMIN 5.1 4.2    Recent Labs Lab 09/21/13 2357  LIPASE 19   No results found for this basename: AMMONIA,  in the last 168 hours CBC:  Recent Labs Lab 09/20/13 2041 09/21/13 2355 09/22/13 0535 09/24/13 0520  WBC 10.6* 8.7 7.2 6.6  NEUTROABS 8.7* 7.0  --   --   HGB 15.1 13.7 12.3* 12.2*  HCT 44.6 40.3 35.7* 36.1*  MCV 77.8* 80.1 78.3 78.6  PLT 243 245 PLATELET CLUMPS NOTED ON SMEAR, COUNT APPEARS ADEQUATE 242   Cardiac Enzymes: No results found for this basename: CKTOTAL, CKMB, CKMBINDEX, TROPONINI,  in the last 168 hours BNP (last 3 results) No results found for this basename: PROBNP,  in the last 8760 hours CBG:  Recent Labs Lab 09/24/13 1200 09/24/13 1737 09/24/13 2144 09/25/13 0803 09/25/13 1201  GLUCAP 135* 181* 171* 166* 171*    No results found for this or any previous visit (from the past 240 hour(s)).   Studies: No results found.  Scheduled Meds: . aspirin EC  81 mg Oral Daily  . docusate  sodium  100 mg Oral BID  . enalaprilat  1.25 mg Intravenous 4 times per day  . insulin aspart  0-5 Units Subcutaneous QHS  . insulin aspart  0-9 Units Subcutaneous TID WC  . metoCLOPramide (REGLAN) injection  10 mg Intravenous 4 times per day  . metoprolol  5 mg Intravenous 4 times per day  . pantoprazole  40 mg Oral Q0600  . sodium chloride  3 mL Intravenous Q12H  . sorbitol, milk of mag, mineral oil, glycerin (SMOG) enema  960 mL Rectal Once   Continuous Infusions: . sodium chloride 125 mL/hr at 09/25/13  0555    Principal Problem:   Intractable nausea and vomiting Active Problems:   Diabetic gastroparesis   IDDM (insulin dependent diabetes mellitus)   HTN (hypertension)   Diabetic neuropathy   Unspecified constipation    Time spent: 45 mins    [facility] MD [facility] Pager [phone]. If 7PM-7AM, please contact night-coverage at [email or web address], password [facility] 09/25/2013, 1:53 PM  LOS: 4 days

## 2013-09-26 LAB — CBC
HCT: 38.2 % — ABNORMAL LOW (ref 39.0–52.0)
HEMOGLOBIN: 13.1 g/dL (ref 13.0–17.0)
MCH: 26.7 pg (ref 26.0–34.0)
MCHC: 34.3 g/dL (ref 30.0–36.0)
MCV: 77.8 fL — ABNORMAL LOW (ref 78.0–100.0)
PLATELETS: ADEQUATE 10*3/uL (ref 150–400)
RBC: 4.91 MIL/uL (ref 4.22–5.81)
RDW: 12.2 % (ref 11.5–15.5)
WBC: 8.5 10*3/uL (ref 4.0–10.5)

## 2013-09-26 LAB — BASIC METABOLIC PANEL
Anion gap: 16 — ABNORMAL HIGH (ref 5–15)
BUN: 10 mg/dL (ref 6–23)
CHLORIDE: 100 meq/L (ref 96–112)
CO2: 24 mEq/L (ref 19–32)
Calcium: 9 mg/dL (ref 8.4–10.5)
Creatinine, Ser: 0.96 mg/dL (ref 0.50–1.35)
Glucose, Bld: 168 mg/dL — ABNORMAL HIGH (ref 70–99)
POTASSIUM: 4.1 meq/L (ref 3.7–5.3)
SODIUM: 140 meq/L (ref 137–147)

## 2013-09-26 LAB — GLUCOSE, CAPILLARY
GLUCOSE-CAPILLARY: 179 mg/dL — AB (ref 70–99)
GLUCOSE-CAPILLARY: 195 mg/dL — AB (ref 70–99)
Glucose-Capillary: 168 mg/dL — ABNORMAL HIGH (ref 70–99)

## 2013-09-26 MED ORDER — SORBITOL 70 % SOLN
960.0000 mL | TOPICAL_OIL | Freq: Once | ORAL | Status: AC
  Administered 2013-09-26: 960 mL via RECTAL
  Filled 2013-09-26: qty 240

## 2013-09-26 NOTE — Progress Notes (Signed)
[facility] PROGRESS NOTE  Duane [name] [id number] DOB: [birth year] DOA: 09/21/2013 PCP: [name], MD  Assessment/Plan: #1 nausea /vomiting/ abdominal pain/gastroparesis Likely secondary to constipation versus gastroparesis. Patient with no significant improvement. Acute abdominal series negative for any acute abnormalities however did show a moderate stool in the colon. Repeat abdominal x-ray showing moderate to severe stool in the colon. Continue IV fluids.Bowel rest.  Anti-emetics. Pain management. Continue IV Reglan. Patient has been refusing an enema. Will try a  SMOG enema. Follow.  #2 constipation Patient refused enema yesterday. X-ray show moderate to severe stool in the colon. Continue Colace. Will give SMOG enema.   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 CBGs currently ranging from 175-195. Continue SSI.  #4 hypertension Stable. Continue IV enalapril and lopressor.  #5 prophylaxis SCDs for DVT prophylaxis.    Code Status: Full Family Communication: Updated patient and mother at bedside. Disposition Plan: Home when medically stable.   Consultants:  None  Procedures:  Acute abdominal series 09/22/2013  KUB 09/25/2013  Antibiotics:  None  HPI/Subjective: Patient still with some nausea and emesis. Patient still with abdominal pain. Patient with no BM.  Objective: Filed Vitals:   09/26/13 0614  BP: 191/97  Pulse: 106  Temp: 98.7 F (37.1 C)  Resp: 20    Intake/Output Summary (Last 24 hours) at 09/26/13 1216 Last data filed at 09/26/13 0647  Gross per 24 hour  Intake 3097.92 ml  Output    600 ml  Net 2497.92 ml   Filed Weights   09/22/13 0425  Weight: 124.6 kg (274 lb 11.1 oz)    Exam:   General:  NAD  Cardiovascular: RRR  Respiratory: CTAB  Abdomen: Soft, some tenderness to palpation, nondistended, hypoactive bowel  sounds.  Musculoskeletal: No clubbing cyanosis or edema  Data Reviewed: Basic Metabolic Panel:  Recent Labs Lab 09/20/13 2041 09/21/13 2357 09/22/13 0535 09/24/13 0520 09/26/13 0605  NA 141 138 139 141 140  K 4.2 4.1 4.2 3.9 4.1  CL 98 98 102 103 100  CO2 20 23 22 24 24   GLUCOSE 288* 247* 195* 144* 168*  BUN 16 13 12 11 10   CREATININE 1.23 1.22 1.11 1.12 0.96  CALCIUM 10.5 9.4 8.8 8.8 9.0   Liver Function Tests:  Recent Labs Lab 09/20/13 2041 09/21/13 2357  AST 16 17  ALT 14 14  ALKPHOS 120* 86  BILITOT 0.5 0.7  PROT 9.0* 7.6  ALBUMIN 5.1 4.2    Recent Labs Lab 09/21/13 2357  LIPASE 19   No results found for this basename: AMMONIA,  in the last 168 hours CBC:  Recent Labs Lab 09/20/13 2041 09/21/13 2355 09/22/13 0535 09/24/13 0520 09/26/13 0605  WBC 10.6* 8.7 7.2 6.6 8.5  NEUTROABS 8.7* 7.0  --   --   --   HGB 15.1 13.7 12.3* 12.2* 13.1  HCT 44.6 40.3 35.7* 36.1* 38.2*  MCV 77.8* 80.1 78.3 78.6 77.8*  PLT 243 245 PLATELET CLUMPS NOTED ON SMEAR, COUNT APPEARS ADEQUATE 242 PLATELET CLUMPS NOTED ON SMEAR, COUNT APPEARS ADEQUATE   Cardiac Enzymes: No results found for this basename: CKTOTAL, CKMB, CKMBINDEX, TROPONINI,  in the last 168 hours BNP (last 3 results) No results found for this basename: PROBNP,  in the last 8760 hours CBG:  Recent Labs Lab 09/25/13 1201 09/25/13 1619 09/25/13 2117 09/26/13 0727 09/26/13 1133  GLUCAP 171* 165* 175* 179* 195*    No results found for this or any previous visit (  from the past 240 hour(s)).   Studies: Dg Abd 2 Views  09/25/2013   CLINICAL DATA:  Nausea and vomiting  EXAM: ABDOMEN - 2 VIEW  COMPARISON:  09/22/2013  FINDINGS: Scattered large and small bowel gas is noted. Fecal material is noted throughout the colon without definitive obstructive change. No abnormal mass or abnormal calcifications are seen. No free air is noted.  IMPRESSION: No acute abnormality seen. A moderate to severe stool burden is  noted but stable.   Electronically Signed   By: Inez Catalina M.D.   On: 09/25/2013 14:35    Scheduled Meds: . aspirin EC  81 mg Oral Daily  . docusate sodium  100 mg Oral BID  . enalaprilat  1.25 mg Intravenous 4 times per day  . insulin aspart  0-5 Units Subcutaneous QHS  . insulin aspart  0-9 Units Subcutaneous TID WC  . metoCLOPramide (REGLAN) injection  10 mg Intravenous 4 times per day  . metoprolol  5 mg Intravenous 4 times per day  . pantoprazole  40 mg Oral Q0600  . sodium chloride  3 mL Intravenous Q12H  . sorbitol, milk of mag, mineral oil, glycerin (SMOG) enema  960 mL Rectal Once  . sorbitol, milk of mag, mineral oil, glycerin (SMOG) enema  960 mL Rectal Once   Continuous Infusions: . sodium chloride 125 mL/hr at 09/26/13 [id number]    Principal Problem:   Intractable nausea and vomiting Active Problems:   Diabetic gastroparesis   IDDM (insulin dependent diabetes mellitus)   HTN (hypertension)   Diabetic neuropathy   Unspecified constipation    Time spent: 58 mins    [facility] MD [facility] Pager [phone]. If 7PM-7AM, please contact night-coverage at [email or web address], password [facility] 09/26/2013, 12:16 PM  LOS: 5 days

## 2013-09-27 LAB — GLUCOSE, CAPILLARY
GLUCOSE-CAPILLARY: 178 mg/dL — AB (ref 70–99)
Glucose-Capillary: 184 mg/dL — ABNORMAL HIGH (ref 70–99)
Glucose-Capillary: 165 mg/dL — ABNORMAL HIGH (ref 70–99)
Glucose-Capillary: 197 mg/dL — ABNORMAL HIGH (ref 70–99)
Glucose-Capillary: 177 mg/dL — ABNORMAL HIGH (ref 70–99)

## 2013-09-27 LAB — MAGNESIUM: MAGNESIUM: 1.8 mg/dL (ref 1.5–2.5)

## 2013-09-27 LAB — BASIC METABOLIC PANEL
ANION GAP: 14 (ref 5–15)
BUN: 7 mg/dL (ref 6–23)
CALCIUM: 9 mg/dL (ref 8.4–10.5)
CO2: 28 mEq/L (ref 19–32)
Chloride: 99 mEq/L (ref 96–112)
Creatinine, Ser: 0.91 mg/dL (ref 0.50–1.35)
GFR calc non Af Amer: >90 mL/min (ref >90)
Glucose, Bld: 169 mg/dL — ABNORMAL HIGH (ref 70–99)
Potassium: 3.5 mEq/L — ABNORMAL LOW (ref 3.7–5.3)
SODIUM: 141 meq/L (ref 137–147)

## 2013-09-27 MED ORDER — POTASSIUM CHLORIDE CRYS ER 20 MEQ PO TBCR
40.0000 meq | EXTENDED_RELEASE_TABLET | Freq: Once | ORAL | Status: AC
  Administered 2013-09-27: 40 meq via ORAL
  Filled 2013-09-27: qty 2

## 2013-09-27 MED ORDER — MAGNESIUM SULFATE 40 MG/ML IJ SOLN
2.0000 g | Freq: Once | INTRAMUSCULAR | Status: AC
  Administered 2013-09-27: 2 g via INTRAVENOUS
  Filled 2013-09-27: qty 50

## 2013-09-27 MED ORDER — POTASSIUM CHLORIDE 10 MEQ/100ML IV SOLN
10.0000 meq | INTRAVENOUS | Status: AC
  Administered 2013-09-27 (×4): 10 meq via INTRAVENOUS
  Filled 2013-09-27 (×4): qty 100

## 2013-09-27 NOTE — Progress Notes (Signed)
09/27/13 1145  Asked patient if he was ready to wash up. Patient stated yes. Set patient up with clean gown, wash clothes, towels, soap, and a basin with warm water. Went to check on patient at 1200, patient had not began to wash up.

## 2013-09-27 NOTE — Progress Notes (Signed)
[facility] PROGRESS NOTE  Rosbel Amador [id number] DOB: May 05, 1975 DOA: 09/21/2013 PCP: [name], MD  Assessment/Plan: #1 nausea /vomiting/ abdominal pain/gastroparesis Likely secondary to constipation versus gastroparesis. Patient with no significant improvement. Acute abdominal series negative for any acute abnormalities, however did show a moderate stool in the colon. Repeat abdominal x-ray showing moderate to severe stool in the colon. Continue IV fluids.Bowel rest.  Anti-emetics. Pain management. Continue IV Reglan. Patient received SMOG enema yesterday and today.   #2 constipation Patient received SMOG enema yesterday. X-ray showed moderate to severe stool in the colon. Continue Colace. Will give SMOG enema today. Good results.  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 CBGs currently ranging from 165-184. Continue SSI.  #4 hypertension Stable. Continue IV enalapril and lopressor.  #5 prophylaxis SCDs for DVT prophylaxis.    Code Status: Full Family Communication: Updated patient and mother at bedside. Disposition Plan: Home when medically stable.   Consultants:  None  Procedures:  Acute abdominal series 09/22/2013  KUB 09/25/2013  Antibiotics:  None  HPI/Subjective: Patient still with some nausea and emesis. Patient still with abdominal pain. Patient with bowel movement after smog enema.  Objective: Filed Vitals:   09/27/13 0523  BP: 164/83  Pulse: 87  Temp: 98.6 F (37 C)  Resp: 18    Intake/Output Summary (Last 24 hours) at 09/27/13 1139 Last data filed at 09/27/13 0600  Gross per 24 hour  Intake 2020.83 ml  Output   1225 ml  Net 795.83 ml   Filed Weights   09/22/13 0425  Weight: 124.6 kg (274 lb 11.1 oz)    Exam:   General:  NAD  Cardiovascular: RRR  Respiratory: CTAB  Abdomen: Soft, some tenderness to palpation, nondistended,  hypoactive bowel sounds.  Musculoskeletal: No clubbing cyanosis or edema  Data Reviewed: Basic Metabolic Panel:  Recent Labs Lab 09/21/13 2357 09/22/13 0535 09/24/13 0520 09/26/13 0605 09/27/13 0550  NA 138 139 141 140 141  K 4.1 4.2 3.9 4.1 3.5*  CL 98 102 103 100 99  CO2 23 22 24 24 28   GLUCOSE 247* 195* 144* 168* 169*  BUN 13 12 11 10 7   CREATININE 1.22 1.11 1.12 0.96 0.91  CALCIUM 9.4 8.8 8.8 9.0 9.0  MG  --   --   --   --  1.8   Liver Function Tests:  Recent Labs Lab 09/20/13 2041 09/21/13 2357  AST 16 17  ALT 14 14  ALKPHOS 120* 86  BILITOT 0.5 0.7  PROT 9.0* 7.6  ALBUMIN 5.1 4.2    Recent Labs Lab 09/21/13 2357  LIPASE 19   No results found for this basename: AMMONIA,  in the last 168 hours CBC:  Recent Labs Lab 09/20/13 2041 09/21/13 2355 09/22/13 0535 09/24/13 0520 09/26/13 0605  WBC 10.6* 8.7 7.2 6.6 8.5  NEUTROABS 8.7* 7.0  --   --   --   HGB 15.1 13.7 12.3* 12.2* 13.1  HCT 44.6 40.3 35.7* 36.1* 38.2*  MCV 77.8* 80.1 78.3 78.6 77.8*  PLT 243 245 PLATELET CLUMPS NOTED ON SMEAR, COUNT APPEARS ADEQUATE 242 PLATELET CLUMPS NOTED ON SMEAR, COUNT APPEARS ADEQUATE   Cardiac Enzymes: No results found for this basename: CKTOTAL, CKMB, CKMBINDEX, TROPONINI,  in the last 168 hours BNP (last 3 results) No results found for this basename: PROBNP,  in the last 8760 hours CBG:  Recent Labs Lab 09/26/13 1133 09/26/13 1712 09/26/13 2320 09/27/13 0718 09/27/13 1128  GLUCAP 195* 168*  184* 165* 178*    No results found for this or any previous visit (from the past 240 hour(s)).   Studies: Dg Abd 2 Views  09/25/2013   CLINICAL DATA:  Nausea and vomiting  EXAM: ABDOMEN - 2 VIEW  COMPARISON:  09/22/2013  FINDINGS: Scattered large and small bowel gas is noted. Fecal material is noted throughout the colon without definitive obstructive change. No abnormal mass or abnormal calcifications are seen. No free air is noted.  IMPRESSION: No acute abnormality  seen. A moderate to severe stool burden is noted but stable.   Electronically Signed   By: Inez Catalina M.D.   On: 09/25/2013 14:35    Scheduled Meds: . aspirin EC  81 mg Oral Daily  . docusate sodium  100 mg Oral BID  . enalaprilat  1.25 mg Intravenous 4 times per day  . insulin aspart  0-5 Units Subcutaneous QHS  . insulin aspart  0-9 Units Subcutaneous TID WC  . metoCLOPramide (REGLAN) injection  10 mg Intravenous 4 times per day  . metoprolol  5 mg Intravenous 4 times per day  . pantoprazole  40 mg Oral Q0600  . potassium chloride  10 mEq Intravenous Q1 Hr x 4  . sodium chloride  3 mL Intravenous Q12H   Continuous Infusions: . sodium chloride 125 mL/hr at 09/27/13 [id number]    Principal Problem:   Intractable nausea and vomiting Active Problems:   Diabetic gastroparesis   IDDM (insulin dependent diabetes mellitus)   HTN (hypertension)   Diabetic neuropathy   Unspecified constipation    Time spent: 54 mins    [facility] MD [facility] Pager [phone]. If 7PM-7AM, please contact night-coverage at [email or web address], password [facility] 09/27/2013, 11:39 AM  LOS: 6 days

## 2013-09-28 ENCOUNTER — Encounter (HOSPITAL_COMMUNITY): Payer: Self-pay | Admitting: Physician Assistant

## 2013-09-28 LAB — GLUCOSE, CAPILLARY
GLUCOSE-CAPILLARY: 176 mg/dL — AB (ref 70–99)
GLUCOSE-CAPILLARY: 195 mg/dL — AB (ref 70–99)
Glucose-Capillary: 175 mg/dL — ABNORMAL HIGH (ref 70–99)
Glucose-Capillary: 188 mg/dL — ABNORMAL HIGH (ref 70–99)

## 2013-09-28 LAB — COMPREHENSIVE METABOLIC PANEL
ALBUMIN: 3.5 g/dL (ref 3.5–5.2)
ALT: 8 U/L (ref 0–53)
ANION GAP: 19 — AB (ref 5–15)
AST: 8 U/L (ref 0–37)
Alkaline Phosphatase: 79 U/L (ref 39–117)
BUN: 8 mg/dL (ref 6–23)
CO2: 22 mEq/L (ref 19–32)
Calcium: 8.7 mg/dL (ref 8.4–10.5)
Chloride: 98 mEq/L (ref 96–112)
Creatinine, Ser: 0.87 mg/dL (ref 0.50–1.35)
GFR calc Af Amer: >90 mL/min (ref >90)
GFR calc non Af Amer: >90 mL/min (ref >90)
GLUCOSE: 201 mg/dL — AB (ref 70–99)
POTASSIUM: 3.6 meq/L — AB (ref 3.7–5.3)
Sodium: 139 mEq/L (ref 137–147)
Total Bilirubin: 0.5 mg/dL (ref 0.3–1.2)
Total Protein: 6.9 g/dL (ref 6.0–8.3)

## 2013-09-28 MED ORDER — HYDROMORPHONE HCL PF 1 MG/ML IJ SOLN
1.0000 mg | INTRAMUSCULAR | Status: DC | PRN
  Administered 2013-09-28 – 2013-09-29 (×6): 1 mg via INTRAVENOUS
  Filled 2013-09-28 (×6): qty 1

## 2013-09-28 MED ORDER — PROMETHAZINE HCL 25 MG/ML IJ SOLN
25.0000 mg | Freq: Four times a day (QID) | INTRAMUSCULAR | Status: DC
  Administered 2013-09-28 – 2013-10-01 (×9): 25 mg via INTRAVENOUS
  Filled 2013-09-28 (×17): qty 1

## 2013-09-28 MED ORDER — HYDRALAZINE HCL 20 MG/ML IJ SOLN
10.0000 mg | Freq: Once | INTRAMUSCULAR | Status: AC
  Administered 2013-09-28: 10 mg via INTRAVENOUS
  Filled 2013-09-28: qty 1

## 2013-09-28 NOTE — Consult Note (Signed)
[facility] Consult: 2:51 PM 09/28/2013  LOS: 7 days    Referring Provider: Dr Grandville Silos. Primary Care Physician:  [name], MD Primary Gastroenterologist:  Dr Scarlette Shorts.    Reason for Consultation:  Nausea/vomiting, flare of gastroparesis.    HPI: Duane [name] is a 38 y.o. male.  Type 1 diabetic since age 46.  Diabetic gastroparesis diagnosed in [place] ~ 2012.  Had EGD 2013.  Admitted twice in April for mgt of n/v.  Initial inpt GI eval by Dr Henrene Pastor 05/2013. On CT scan there was suggestion of transverse colitis.  EGD and flex sig in 06/11/2013 were benign and rec was for medical mgt of gastroparesis.  Has not been to GI office.  GES of 08/18/13 confirmed known gastroparesis with 100% retention and 1 and 2 hour intervals. Hgb A1c of 9.2.  Range of Hgb A1c in last 4 months is 9.0 to 9.3. (serum glucose 212 -220)  Baseline GI meds include PO Phenergan Prn, Reglan po QID, Protonix 40 mg once daily, Carafate qid.  Is on Tramadol but no opiates/narcotics.  Flares start with pain in epigastrium, then n/v ensues.  Will vomit food eaten the day before.  Phenergan po at home effective 30 to 50% of the time.  Phenergan IV works better than Zofran IV.  No blood or CG emesis, no BPR.  Stool frequency and volume varies with his ability to tolerate po.  Does not feel he is constipated.  No NSAIDs.  Had an appt to interview with clinical trial manager for new med for diabetic gastroparesis today, but obviously unable to keep appt.    Admitted with recurrent acute on chronic N/V 7/5 - 7/8, 7/16 - 7/18,  Visits to ED 8/9, 8/10. Admitted 8/10.  Moderate stool burden on plain films.  Is feeling better but nowhere near resolved with IV Reglan, prn Phenergan.  SMOG enemas have resulted in several stools.      ENDOSCOPIC  STUDIES: 06/11/2013   EGD INDICATIONS: Vomiting ENDOSCOPIC IMPRESSION:  1. Medium sized hiatal hernia  2. The remainder of the upper endoscopy exam was otherwise normal  RECOMMENDATIONS:  Continue gastroparesis Tx   06/11/2013  Flex Sig  INDICATIONS:an abnormal CT. Colitis on CT ENDOSCOPIC IMPRESSION:  The colonic mucosa appeared normal in the rectum, sigmoid colon,  descending colon, at the splenic flexure, and in the transverse  colon  RECOMMENDATIONS:  continue care for gastroparesis and try to advance diet  do not think he had colitis - probably CT artifact/overcall/physiologic   Past Medical History  Diagnosis Date  . Hypertension   . Diabetes mellitus without complication age 45    insulin requiring from the start  . Gastritis   . Gastroparesis 2012    100% gastric retention on 08/2013 GES  . Obesity   . Diabetic neuropathy     Past Surgical History  Procedure Laterality Date  . Cholecystectomy  2013  . Knee surgery Left   . Flexible sigmoidoscopy N/A 06/11/2013    Procedure: FLEXIBLE SIGMOIDOSCOPY;  Surgeon: [name], MD;  Location: [facility];  Service: Endoscopy;  Laterality: N/A;  . Esophagogastroduodenoscopy N/A 06/11/2013    Procedure: ESOPHAGOGASTRODUODENOSCOPY (EGD);  Surgeon: [name], MD;  Location: [facility];  Service: Endoscopy;  Laterality: N/A;    Prior to Admission medications   Medication Sig Start Date End Date Taking? Authorizing Provider  acetaminophen (TYLENOL) 500 MG tablet Take 500 mg by mouth every 6 (six) hours as needed for mild pain.    Yes Historical Provider, MD  amLODipine (NORVASC) 10 MG tablet Take 1 tablet (10 mg total) by mouth every morning. 08/29/13  Yes Venetia Maxon Rama, MD  cloNIDine (CATAPRES) 0.1 MG tablet Take 1 tablet (0.1 mg total) by mouth 2 (two) times daily. 08/29/13  Yes Christina P Rama, MD  gabapentin (NEURONTIN) 300 MG capsule Take 1 capsule (300 mg total) by mouth at bedtime. 08/29/13  Yes Christina P Rama,  MD  insulin aspart (NOVOLOG) 100 UNIT/ML injection Inject 9-12 Units into the skin 3 (three) times daily before meals. SSI 08/29/13  Yes Christina P Rama, MD  insulin glargine (LANTUS) 100 UNIT/ML injection Inject 22 Units into the skin at bedtime.    Yes Historical Provider, MD  metoCLOPramide (REGLAN) 10 MG tablet Take 10 mg by mouth 4 (four) times daily.   Yes Historical Provider, MD  metoprolol (LOPRESSOR) 50 MG tablet Take 50 mg by mouth 2 (two) times daily.  08/20/13  Yes Historical Provider, MD  pantoprazole (PROTONIX) 40 MG tablet Take 1 tablet (40 mg total) by mouth every morning. 08/29/13  Yes Venetia Maxon Rama, MD  promethazine (PHENERGAN) 25 MG tablet Take 1 tablet (25 mg total) by mouth every 8 (eight) hours as needed for nausea or vomiting. 09/21/13  Yes Resa Miner Lawyer, PA-C  sucralfate (CARAFATE) 1 G tablet Take 1 g by mouth 4 (four) times daily -  before meals and at bedtime.   Yes Historical Provider, MD  traMADol (ULTRAM) 50 MG tablet Take 50 mg by mouth every 6 (six) hours as needed for moderate pain.  08/19/13  Yes Historical Provider, MD    Scheduled Meds: . aspirin EC  81 mg Oral Daily  . docusate sodium  100 mg Oral BID  . enalaprilat  1.25 mg Intravenous 4 times per day  . insulin aspart  0-5 Units Subcutaneous QHS  . insulin aspart  0-9 Units Subcutaneous TID WC  . metoCLOPramide (REGLAN) injection  10 mg Intravenous 4 times per day  . metoprolol  5 mg Intravenous 4 times per day  . pantoprazole  40 mg Oral Q0600  . promethazine  25 mg Intravenous 4 times per day  . sodium chloride  3 mL Intravenous Q12H   Infusions: . sodium chloride 100 mL/hr at 09/28/13 1155   PRN Meds: acetaminophen, chlorproMAZINE, gi cocktail, HYDROmorphone (DILAUDID) injection, ondansetron (ZOFRAN) IV, ondansetron, promethazine   Allergies as of 09/21/2013  . (No Known Allergies)    Family History  Problem Relation Age of Onset  . Diabetes Father     History   Social History  .  Marital Status: Single    Spouse Name: N/A    Number of Children: N/A  . Years of Education: N/A   Occupational History  . 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    Social History Main Topics  . Smoking status: Never Smoker   . Smokeless tobacco: Never Used  . Alcohol Use: Yes     Comment: "none really" - 08/27/13  .  Drug Use: No  . Sexual Activity: Yes   Other Topics Concern  . Not on file   Social History Narrative  . No narrative on file    REVIEW OF SYSTEMS: Constitutional:  290# in 12/2012. Now 274# ENT:  No nose bleeds Pulm:  No cough or dyspnea CV:  No palpitations, no LE edema.  GU:  No hematuria, no frequency GI:  Per HPI.  No sense of food sticking in esophagus Heme:  No unusual bruising or bleeding   Transfusions:  none Neuro:  No headaches, no peripheral tingling or numbness Derm:  No itching, no rash or sores.  Endocrine:  No sweats or chills.  No polyuria or dysuria Immunization:  Not queried.  Travel:  None beyond local counties in last few months.    PHYSICAL EXAM: Vital signs in last 24 hours: Filed Vitals:   09/28/13 1342  BP: 141/82  Pulse: 93  Temp: 98.2 F (36.8 C)  Resp: 18   Wt Readings from Last 3 Encounters:  09/22/13 124.6 kg (274 lb 11.1 oz)  09/20/13 126.554 kg (279 lb)  09/18/13 126.735 kg (279 lb 6.4 oz)   General: obese, sleepy but arousable, comfortable.  Not acutely ill looking Head:  No swelling or asymmetry  Eyes:  No icterus or pallor Ears:  Not HOH  Nose:  No congestion or discharge Mouth:  Clear, moist.   Neck:  No mass, no TMG, no thyromegaly Lungs:  Clear bil.  No dyspnea or cough Heart: RRR.  No MRG Abdomen:  Soft, BS absent, no fluid wave.  Not tender.  No mass or HSM.   Rectal: deferred   Musc/Skeltl: no joint swelling, deformity or redness Extremities:  Minor pedal swelling but no pitting  Neurologic:  No tremor, no limb weakness.  Oriented x 3.  Good historian.  Skin:  No rash.    Tattoos:  On right arm Nodes:  No cervical adenopathy.    Psych:  Affect blunted, somnolent but mostly maintains arousal.    Intake/Output from previous day: 08/16 0701 - 08/17 0700 In: -  Out: 2650 [Urine:2400; Emesis/NG output:250] Intake/Output this shift: Total I/O In: -  Out: 900 [Urine:900]  LAB RESULTS:  Recent Labs  09/26/13 0605  WBC 8.5  HGB 13.1  HCT 38.2*  PLT PLATELET CLUMPS NOTED ON SMEAR, COUNT APPEARS ADEQUATE   BMET Lab Results  Component Value Date   NA 139 09/28/2013   NA 141 09/27/2013   NA 140 09/26/2013   K 3.6* 09/28/2013   K 3.5* 09/27/2013   K 4.1 09/26/2013   CL 98 09/28/2013   CL 99 09/27/2013   CL 100 09/26/2013   CO2 22 09/28/2013   CO2 28 09/27/2013   CO2 24 09/26/2013   GLUCOSE 201* 09/28/2013   GLUCOSE 169* 09/27/2013   GLUCOSE 168* 09/26/2013   BUN 8 09/28/2013   BUN 7 09/27/2013   BUN 10 09/26/2013   CREATININE 0.87 09/28/2013   CREATININE 0.91 09/27/2013   CREATININE 0.96 09/26/2013   CALCIUM 8.7 09/28/2013   CALCIUM 9.0 09/27/2013   CALCIUM 9.0 09/26/2013   LFT  Recent Labs  09/28/13 0640  PROT 6.9  ALBUMIN 3.5  AST 8  ALT 8  ALKPHOS 79  BILITOT 0.5   PT/INR Lab Results  Component Value Date   INR 1.04 09/22/2013   Lipase     Component Value Date/Time   LIPASE 19 09/21/2013 2357    RADIOLOGY STUDIES: 09/25/2013  ABDOMEN - 2 VIEW  COMPARISON: 09/22/2013  FINDINGS:  Scattered large and small bowel gas is noted. Fecal material is  noted throughout the colon without definitive obstructive change. No  abnormal mass or abnormal calcifications are seen. No free air is  noted.  IMPRESSION:  No acute abnormality seen. A moderate to severe stool burden is  noted but stable.   IMPRESSION:   *  Frequent acute bouts of n/v/upper abdominal pain due to gastroparesis. This despite compliance with home GI med regimen.  05/2013 EGD showed only showed HH.  *  Type 1 DM.   *  ? Transverse colitis on CT 05/2013:  Flex sig  negative.  * Constipation.   PLAN:     *  I changed him to scheduled IV Phenergan, will still have prn available.  Will allow bariatric clear diet, pt feels ready to try these.   *  Dr Grandville Silos just reduced dose and frequency of prn Dilaudid, I agree with this.  Narcotics work at cross purposes in managing his gastroparesis.   *  Reviewed Gabapentin on "[facility]", there is associated s/e of n/v with this med.  Pt says it does help his neuropathy.  Will leave in place as its discontinuation is unlikely to be the "silver bullet" that solves his GI issues, but need to consider a drug vacation from this to see if helpful.   *  Referral to Dr Derrill Kay at [facility]?   Azucena Freed  09/28/2013, 2:51 PM Pager: [phone]      Attending physician's note   I have taken a history, examined the patient and reviewed the chart. I agree with the Advanced Practitioner's note, impression and recommendations. Exacerbation of gastroparesis with refractory N/V.  Optimization of glycemic control is essential, both short and long term.  Avoid, or at least minimize, opioid pain medications and anticholinergics.  RTC IV Phenergan, IV Reglan and IV PPI for now.  Consider IV erythromycin, IV ativan.  Consider outpatient referral to Dr. Derrill Kay at [facility].  Outpatient follow up with an endocrinologist if he is not already seeing one.  Enemas to address constipation as have been ordered.  Long term oral laxative regimen such as Miralax qd to bid when he is taking PO adequately to help prevent constipation.   Ladene Artist, MD [facility]

## 2013-09-28 NOTE — Progress Notes (Signed)
[facility] PROGRESS NOTE  Duane [name] [id number] DOB: 1975/10/14 DOA: 09/21/2013 PCP: [name], MD  Assessment/Plan: #1 nausea /vomiting/ abdominal pain/gastroparesis Likely secondary to constipation versus gastroparesis. Patient with no significant improvement. Acute abdominal series negative for any acute abnormalities, however did show a moderate stool in the colon. Repeat abdominal x-ray showing moderate to severe stool in the colon. Patient continues to have abdominal pain with nausea. No significant improvement. Will try clear liquids today. Continue IV fluids.Bowel rest.  Anti-emetics. Pain management. Continue IV Reglan. Will consult with gastroenterology for further evaluation and management.  #2 constipation Patient received SMOG enema with good results. X-ray showed moderate to severe stool in the colon.   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 CBGs currently ranging from 175-188. Continue SSI.  #4 hypertension Stable. Continue IV enalapril and lopressor.  #5 prophylaxis SCDs for DVT prophylaxis.    Code Status: Full  Family Communication: Updated patient at bedside. Disposition Plan: Home when medically stable.   Consultants:  None  Procedures:  Acute abdominal series 09/22/2013  KUB 09/25/2013  Antibiotics:  None  HPI/Subjective: Patient still with some nausea and emesis. Patient still with abdominal pain. Patient with bowel movements after smog enema.  Objective: Filed Vitals:   09/28/13 0517  BP: 188/98  Pulse: 103  Temp: 98.5 F (36.9 C)  Resp: 16    Intake/Output Summary (Last 24 hours) at 09/28/13 1302 Last data filed at 09/28/13 0825  Gross per 24 hour  Intake      0 ml  Output   2600 ml  Net  -2600 ml   Filed Weights   09/22/13 0425  Weight: 124.6 kg (274 lb 11.1 oz)    Exam:   General:  NAD  Cardiovascular:  RRR  Respiratory: CTAB  Abdomen: Soft, some tenderness to palpation, nondistended, hypoactive bowel sounds.  Musculoskeletal: No clubbing cyanosis or edema  Data Reviewed: Basic Metabolic Panel:  Recent Labs Lab 09/22/13 0535 09/24/13 0520 09/26/13 0605 09/27/13 0550 09/28/13 0640  NA 139 141 140 141 139  K 4.2 3.9 4.1 3.5* 3.6*  CL 102 103 100 99 98  CO2 22 24 24 28 22   GLUCOSE 195* 144* 168* 169* 201*  BUN 12 11 10 7 8   CREATININE 1.11 1.12 0.96 0.91 0.87  CALCIUM 8.8 8.8 9.0 9.0 8.7  MG  --   --   --  1.8  --    Liver Function Tests:  Recent Labs Lab 09/21/13 2357 09/28/13 0640  AST 17 8  ALT 14 8  ALKPHOS 86 79  BILITOT 0.7 0.5  PROT 7.6 6.9  ALBUMIN 4.2 3.5    Recent Labs Lab 09/21/13 2357  LIPASE 19   No results found for this basename: AMMONIA,  in the last 168 hours CBC:  Recent Labs Lab 09/21/13 2355 09/22/13 0535 09/24/13 0520 09/26/13 0605  WBC 8.7 7.2 6.6 8.5  NEUTROABS 7.0  --   --   --   HGB 13.7 12.3* 12.2* 13.1  HCT 40.3 35.7* 36.1* 38.2*  MCV 80.1 78.3 78.6 77.8*  PLT 245 PLATELET CLUMPS NOTED ON SMEAR, COUNT APPEARS ADEQUATE 242 PLATELET CLUMPS NOTED ON SMEAR, COUNT APPEARS ADEQUATE   Cardiac Enzymes: No results found for this basename: CKTOTAL, CKMB, CKMBINDEX, TROPONINI,  in the last 168 hours BNP (last 3 results) No results found for this basename: PROBNP,  in the last 8760 hours CBG:  Recent Labs Lab 09/27/13 1128 09/27/13 1641 09/27/13 2121  09/28/13 0740 09/28/13 1140  GLUCAP 178* 197* 177* 175* 188*    No results found for this or any previous visit (from the past 240 hour(s)).   Studies: No results found.  Scheduled Meds: . aspirin EC  81 mg Oral Daily  . docusate sodium  100 mg Oral BID  . enalaprilat  1.25 mg Intravenous 4 times per day  . insulin aspart  0-5 Units Subcutaneous QHS  . insulin aspart  0-9 Units Subcutaneous TID WC  . metoCLOPramide (REGLAN) injection  10 mg Intravenous 4 times per day   . metoprolol  5 mg Intravenous 4 times per day  . pantoprazole  40 mg Oral Q0600  . sodium chloride  3 mL Intravenous Q12H   Continuous Infusions: . sodium chloride 100 mL/hr at 09/28/13 1155    Principal Problem:   Intractable nausea and vomiting Active Problems:   Diabetic gastroparesis   IDDM (insulin dependent diabetes mellitus)   HTN (hypertension)   Diabetic neuropathy   Unspecified constipation    Time spent: 65 mins    [facility] MD [facility] Pager [phone]. If 7PM-7AM, please contact night-coverage at [email or web address], password [facility] 09/28/2013, 1:02 PM  LOS: 7 days

## 2013-09-29 LAB — BASIC METABOLIC PANEL
ANION GAP: 26 — AB (ref 5–15)
BUN: 9 mg/dL (ref 6–23)
CHLORIDE: 97 meq/L (ref 96–112)
CO2: 19 meq/L (ref 19–32)
Calcium: 9.1 mg/dL (ref 8.4–10.5)
Creatinine, Ser: 0.9 mg/dL (ref 0.50–1.35)
GFR calc non Af Amer: >90 mL/min (ref >90)
Glucose, Bld: 200 mg/dL — ABNORMAL HIGH (ref 70–99)
POTASSIUM: 3.4 meq/L — AB (ref 3.7–5.3)
Sodium: 142 mEq/L (ref 137–147)

## 2013-09-29 LAB — CBC
HEMATOCRIT: 40.2 % (ref 39.0–52.0)
HEMOGLOBIN: 14.1 g/dL (ref 13.0–17.0)
MCH: 27.2 pg (ref 26.0–34.0)
MCHC: 35.1 g/dL (ref 30.0–36.0)
MCV: 77.5 fL — ABNORMAL LOW (ref 78.0–100.0)
Platelets: 229 10*3/uL (ref 150–400)
RBC: 5.19 MIL/uL (ref 4.22–5.81)
RDW: 12.6 % (ref 11.5–15.5)
WBC: 8.7 10*3/uL (ref 4.0–10.5)

## 2013-09-29 LAB — GLUCOSE, CAPILLARY
GLUCOSE-CAPILLARY: 210 mg/dL — AB (ref 70–99)
GLUCOSE-CAPILLARY: 184 mg/dL — AB (ref 70–99)
GLUCOSE-CAPILLARY: 167 mg/dL — AB (ref 70–99)
Glucose-Capillary: 198 mg/dL — ABNORMAL HIGH (ref 70–99)

## 2013-09-29 MED ORDER — POLYETHYLENE GLYCOL 3350 17 G PO PACK
17.0000 g | PACK | Freq: Two times a day (BID) | ORAL | Status: DC
  Administered 2013-09-30 – 2013-10-06 (×6): 17 g via ORAL
  Filled 2013-09-29 (×15): qty 1

## 2013-09-29 MED ORDER — BOOST / RESOURCE BREEZE PO LIQD
1.0000 | Freq: Two times a day (BID) | ORAL | Status: DC
  Administered 2013-09-30 (×2): 1 via ORAL
  Administered 2013-10-02: 12:00:00 via ORAL
  Administered 2013-10-03 – 2013-10-04 (×2): 1 via ORAL
  Administered 2013-10-05: 10:00:00 via ORAL

## 2013-09-29 MED ORDER — POTASSIUM CHLORIDE CRYS ER 20 MEQ PO TBCR
40.0000 meq | EXTENDED_RELEASE_TABLET | Freq: Once | ORAL | Status: AC
  Administered 2013-09-29: 40 meq via ORAL
  Filled 2013-09-29: qty 2

## 2013-09-29 MED ORDER — PANTOPRAZOLE SODIUM 40 MG IV SOLR
40.0000 mg | Freq: Every day | INTRAVENOUS | Status: DC
  Administered 2013-09-30 – 2013-10-06 (×7): 40 mg via INTRAVENOUS
  Filled 2013-09-29 (×7): qty 40

## 2013-09-29 MED ORDER — HYDROMORPHONE HCL PF 1 MG/ML IJ SOLN
1.0000 mg | Freq: Four times a day (QID) | INTRAMUSCULAR | Status: DC | PRN
  Administered 2013-09-29 – 2013-10-01 (×5): 1 mg via INTRAVENOUS
  Filled 2013-09-29 (×5): qty 1

## 2013-09-29 MED ORDER — INSULIN GLARGINE 100 UNIT/ML ~~LOC~~ SOLN
10.0000 [IU] | Freq: Every day | SUBCUTANEOUS | Status: DC
  Administered 2013-09-29 – 2013-10-06 (×8): 10 [IU] via SUBCUTANEOUS
  Filled 2013-09-29 (×9): qty 0.1

## 2013-09-29 MED ORDER — HYDROMORPHONE HCL PF 1 MG/ML IJ SOLN: 1.0000 mg | Freq: Four times a day (QID) | INTRAMUSCULAR | Status: DC

## 2013-09-29 NOTE — Progress Notes (Signed)
INITIAL NUTRITION ASSESSMENT  DOCUMENTATION CODES Per approved criteria  -Obesity Unspecified   INTERVENTION: - Diet advancement per MD - Resource Breeze BID - RD to continue to monitor   NUTRITION DIAGNOSIS: Inadequate oral intake related to nausea/vomiting as evidenced by RN report.   Goal: Advance diet as tolerated to diabetic diet  Monitor:  Weights, labs, diet advancement, nausea/vomiting  Reason for Assessment: Poor intake   38 y.o. male  Admitting Dx: Intractable nausea and vomiting  ASSESSMENT: Pt with chronic diabetic gastroparesis.He states that this time he started having intractable nausea and vomiting on 8/10. He came to the [facility] ED and was here until 0600 receiving IV fluids and antiemetics. It seemed that his vomiting had stopped and he was discharged to home. He states that the vomiting started again not long after he got home. He complains of severe hiccoughs and abdominal pain 8/11. GI and diabetic coordinator following.   -Pt discussed during multidisciplinary rounds -Had retching this afternoon -Didn't try any liquids yesterday or today -Met with pt who didn't talk long as he wanted to rest but reports he was eating 2 meals/day at home - admits that half of his foods at home were greasy -Weight down 11 pounds in the past month  Lab Results  Component Value Date   HGBA1C 9.3* 09/21/2013    Height: Ht Readings from Last 1 Encounters:  09/22/13 6' 4"  (1.93 m)    Weight: Wt Readings from Last 1 Encounters:  09/22/13 274 lb 11.1 oz (124.6 kg)    Ideal Body Weight: 202 lbs   % Ideal Body Weight: 136%  Wt Readings from Last 10 Encounters:  09/22/13 274 lb 11.1 oz (124.6 kg)  09/20/13 279 lb (126.554 kg)  09/18/13 279 lb 6.4 oz (126.735 kg)  08/28/13 285 lb (129.275 kg)  08/16/13 285 lb 7.9 oz (129.5 kg)  06/11/13 283 lb 4.7 oz (128.5 kg)  06/11/13 283 lb 4.7 oz (128.5 kg)  05/30/13 290 lb (131.543 kg)  01/08/13 290 lb (131.543 kg)     Usual Body Weight: 285 lbs last month  % Usual Body Weight: 96%  BMI:  Body mass index is 33.45 kg/(m^2). Class I obesity   Estimated Nutritional Needs: Kcal: 2300-2500 Protein: 115-130g Fluid: per MD  Skin: Edema on hands   Diet Order: Bariatric clear liquid   EDUCATION NEEDS: -No education needs identified at this time   Intake/Output Summary (Last 24 hours) at 09/29/13 1538 Last data filed at 09/29/13 1400  Gross per 24 hour  Intake 6818.75 ml  Output   1200 ml  Net 5618.75 ml    Last BM: 8/17  Labs:   Recent Labs Lab 09/27/13 0550 09/28/13 0640 09/29/13 0500  NA 141 139 142  K 3.5* 3.6* 3.4*  CL 99 98 97  CO2 28 22 19   BUN 7 8 9   CREATININE 0.91 0.87 0.90  CALCIUM 9.0 8.7 9.1  MG 1.8  --   --   GLUCOSE 169* 201* 200*    CBG (last 3)   Recent Labs  09/29/13 0722 09/29/13 0725 09/29/13 1138  GLUCAP 210* 198* 184*    Scheduled Meds: . aspirin EC  81 mg Oral Daily  . docusate sodium  100 mg Oral BID  . enalaprilat  1.25 mg Intravenous 4 times per day  . insulin aspart  0-5 Units Subcutaneous QHS  . insulin aspart  0-9 Units Subcutaneous TID WC  . insulin glargine  10 Units Subcutaneous Daily  .  metoCLOPramide (REGLAN) injection  10 mg Intravenous 4 times per day  . metoprolol  5 mg Intravenous 4 times per day  . [START ON 09/30/2013] pantoprazole (PROTONIX) IV  40 mg Intravenous Daily  . promethazine  25 mg Intravenous 4 times per day  . sodium chloride  3 mL Intravenous Q12H    Continuous Infusions: . sodium chloride 100 mL/hr at 09/29/13 9292    Past Medical History  Diagnosis Date  . Hypertension   . Diabetes mellitus without complication age 49    insulin requiring from the start  . Gastritis   . Gastroparesis 2012    100% gastric retention on 08/2013 [facility]  . Obesity   . Diabetic neuropathy     Past Surgical History  Procedure Laterality Date  . Cholecystectomy  2013  . Knee surgery Left   . Flexible sigmoidoscopy N/A  06/11/2013    Procedure: FLEXIBLE SIGMOIDOSCOPY;  Surgeon: [name], MD;  Location: [facility];  Service: Endoscopy;  Laterality: N/A;  . Esophagogastroduodenoscopy N/A 06/11/2013    Procedure: ESOPHAGOGASTRODUODENOSCOPY (EGD);  Surgeon: [name], MD;  Location: [facility];  Service: Endoscopy;  Laterality: N/A;    Carlis Stable MS, [place], LDN [phone] Pager [phone] Weekend/After Hours Pager

## 2013-09-29 NOTE — Progress Notes (Signed)
Inpatient Diabetes Program Recommendations  AACE/ADA: New Consensus Statement on Inpatient Glycemic Control (2013)  Target Ranges:  Prepandial:   less than 140 mg/dL      Peak postprandial:   less than 180 mg/dL (1-2 hours)      Critically ill patients:  140 - 180 mg/dL   Reason for Assessment: Hyperglycemia  Diabetes history: Type 2 diabetes Outpatient Diabetes medications: Lantus 22 units at HS, Novolog 9 to 12 units tid Current orders for Inpatient glycemic control: Novolog sensitive correction tid and HS scale  Results for Duane [name], Duane [name] (MRN [id number]) as of 09/29/2013 09:17  Ref. Range 09/28/2013 07:40 09/28/2013 11:40 09/28/2013 16:23 09/28/2013 21:13 09/29/2013 07:22 09/29/2013 07:25  Glucose-Capillary Latest Range: 70-99 mg/dL 175 (H) 188 (H) 176 (H) 195 (H) 210 (H) 198 (H)   Note: CBG's creeping upward.  Before bariatric liquid breakfast CBG is greater than target.  Request MD consider starting Lantus 10 units either daily or at HS in addition to correction.  When intake improves, will need further insulin adjustments.  Thank you.  Jossiah Smoak S. Marcelline Mates, RN, CNS, CDE Inpatient Diabetes Program, team pager [phone]

## 2013-09-29 NOTE — Progress Notes (Signed)
[facility] PROGRESS NOTE  Duane [name] [id number] DOB: April 29, 1975 DOA: 09/21/2013 PCP: [name], MD  Assessment/Plan: #1 nausea /vomiting/ abdominal pain/gastroparesis Likely secondary to constipation versus gastroparesis. Patient with no significant improvement. Acute abdominal series negative for any acute abnormalities, however did show a moderate stool in the colon. Repeat abdominal x-ray showing moderate to severe stool in the colon. Patient continues to have abdominal pain with nausea. No significant improvement. Patient started clear liquids yesterday, but did not eat. On clears now. Will decrease narcotics. Continue IV fluids. Anti-emetics. Pain management. Continue IV Reglan. If no significant improvement in next 24 hours, may need to be tried on IV erythromycin. GI ff.  #2 constipation Patient received SMOG enema with good results. X-ray showed moderate to severe stool in the colon.   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 CBGs currently ranging from 167-198. Continue SSI. Start Lantus 10 units daily.  #4 hypertension Stable. Continue IV enalapril and lopressor.  #5 prophylaxis SCDs for DVT prophylaxis.    Code Status: Full  Family Communication: Updated patient at bedside. Disposition Plan: Home when medically stable.   Consultants:  GI: Dr. Fuller Plan 09/28/13  Procedures:  Acute abdominal series 09/22/2013  KUB 09/25/2013  Antibiotics:  None  HPI/Subjective: Patient still with some nausea and emesis. Patient still with abdominal pain. Patient on clears.  Objective: Filed Vitals:   09/29/13 0610  BP: 133/77  Pulse: 108  Temp: 98.7 F (37.1 C)  Resp: 18    Intake/Output Summary (Last 24 hours) at 09/29/13 1115 Last data filed at 09/29/13 0600  Gross per 24 hour  Intake 5958.75 ml  Output   1500 ml  Net 4458.75 ml   Filed Weights   09/22/13 0425   Weight: 124.6 kg (274 lb 11.1 oz)    Exam:   General:  NAD  Cardiovascular: RRR  Respiratory: CTAB  Abdomen: Soft, some tenderness to palpation, nondistended, hypoactive bowel sounds.  Musculoskeletal: No clubbing cyanosis or edema  Data Reviewed: Basic Metabolic Panel:  Recent Labs Lab 09/24/13 0520 09/26/13 0605 09/27/13 0550 09/28/13 0640 09/29/13 0500  NA 141 140 141 139 142  K 3.9 4.1 3.5* 3.6* 3.4*  CL 103 100 99 98 97  CO2 24 24 28 22 19   GLUCOSE 144* 168* 169* 201* 200*  BUN 11 10 7 8 9   CREATININE 1.12 0.96 0.91 0.87 0.90  CALCIUM 8.8 9.0 9.0 8.7 9.1  MG  --   --  1.8  --   --    Liver Function Tests:  Recent Labs Lab 09/28/13 0640  AST 8  ALT 8  ALKPHOS 79  BILITOT 0.5  PROT 6.9  ALBUMIN 3.5   No results found for this basename: LIPASE, AMYLASE,  in the last 168 hours No results found for this basename: AMMONIA,  in the last 168 hours CBC:  Recent Labs Lab 09/24/13 0520 09/26/13 0605 09/29/13 0500  WBC 6.6 8.5 8.7  HGB 12.2* 13.1 14.1  HCT 36.1* 38.2* 40.2  MCV 78.6 77.8* 77.5*  PLT 242 PLATELET CLUMPS NOTED ON SMEAR, COUNT APPEARS ADEQUATE 229   Cardiac Enzymes: No results found for this basename: CKTOTAL, CKMB, CKMBINDEX, TROPONINI,  in the last 168 hours BNP (last 3 results) No results found for this basename: PROBNP,  in the last 8760 hours CBG:  Recent Labs Lab 09/28/13 1140 09/28/13 1623 09/28/13 2113 09/29/13 0722 09/29/13 0725  GLUCAP 188* 176* 195* 210* 198*    No results  found for this or any previous visit (from the past 240 hour(s)).   Studies: No results found.  Scheduled Meds: . aspirin EC  81 mg Oral Daily  . docusate sodium  100 mg Oral BID  . enalaprilat  1.25 mg Intravenous 4 times per day  .  HYDROmorphone (DILAUDID) injection  1 mg Intravenous Q6H  . insulin aspart  0-5 Units Subcutaneous QHS  . insulin aspart  0-9 Units Subcutaneous TID WC  . insulin glargine  10 Units Subcutaneous Daily  .  metoCLOPramide (REGLAN) injection  10 mg Intravenous 4 times per day  . metoprolol  5 mg Intravenous 4 times per day  . [START ON 09/30/2013] pantoprazole (PROTONIX) IV  40 mg Intravenous Daily  . potassium chloride  40 mEq Oral Once  . promethazine  25 mg Intravenous 4 times per day  . sodium chloride  3 mL Intravenous Q12H   Continuous Infusions: . sodium chloride 100 mL/hr at 09/29/13 [id number]    Principal Problem:   Intractable nausea and vomiting Active Problems:   Diabetic gastroparesis   IDDM (insulin dependent diabetes mellitus)   HTN (hypertension)   Diabetic neuropathy   Unspecified constipation    Time spent: 23 mins    [facility] MD [facility] Pager [phone]. If 7PM-7AM, please contact night-coverage at [email or web address], password [facility] 09/29/2013, 11:15 AM  LOS: 8 days

## 2013-09-29 NOTE — Progress Notes (Signed)
Inpatient Diabetes Program Recommendations  AACE/ADA: New Consensus Statement on Inpatient Glycemic Control (2013)  Target Ranges:  Prepandial:   less than 140 mg/dL      Peak postprandial:   less than 180 mg/dL (1-2 hours)      Critically ill patients:  140 - 180 mg/dL   Reason for Visit: Elevated A1C  Diabetes history: Type 2 diabetes  Outpatient Diabetes medications: Lantus 22 units at HS, Novolog 9 to 12 units tid  Current orders for Inpatient glycemic control: Novolog sensitive correction tid and HS scale, Lantus 10 units daily  Results for Duane, [name] (MRN [id number]) as of 09/29/2013 14:44  Ref. Range 05/31/2013 11:04 08/27/2013 14:23 09/21/2013 23:55  Hemoglobin A1C Latest Range: <5.7 % 9.0 (H) 9.2 (H) 9.3 (H)   Note:  Attempted to discuss persistently elevated A1C and offer outpatient diabetes education follow-up; however, patient obviously not feeling well.  Excused self and will request a co-worker re-attempt assessment another day.  Thank you.  Duane [name] S. Marcelline Mates, RN, CNS, CDE Inpatient Diabetes Program, team pager [phone]

## 2013-09-29 NOTE — Progress Notes (Signed)
Patient noted to be retching in pan, some clear fluid in pan.  Patient stated his medication would need to be changed back as he is vomiting again, I notified patient that his nausea regimen had not changed, it was only the pain regimen.  Iv Zofran given.

## 2013-09-29 NOTE — Progress Notes (Signed)
     [facility] Progress Note  Subjective:  Did not try any liquids yesterday or today.  Thinks that the ATC phenergan may be helping a little bit though.    Objective:  Vital signs in last 24 hours: Temp:  [98.2 F (36.8 C)-99.2 F (37.3 C)] 98.7 F (37.1 C) (08/18 0610) Pulse Rate:  [93-117] 108 (08/18 0610) Resp:  [16-18] 18 (08/18 0610) BP: (133-193)/(77-120) 133/77 mmHg (08/18 0610) SpO2:  [97 %-98 %] 98 % (08/18 0610) Last BM Date: 09/27/13 General:  Alert, Well-developed, in NAD Heart:  Tachy but regular rhythm.  No murmurs noted. Pulm:  CTAB.  No W/R/R. Abdomen:  Soft, non-distended. Normal bowel sounds.  Mild epigastric TTP without R/R/G. Extremities:  Without edema. Neurologic:  Alert and  oriented x4;  grossly normal neurologically. Psych:  Alert and cooperative.  Depressed mood.  Intake/Output from previous day: 08/17 0701 - 08/18 0700 In: 5958.8 [I.V.:5958.8] Out: 2100 [Urine:2100]  Lab Results:  Recent Labs  09/29/13 0500  WBC 8.7  HGB 14.1  HCT 40.2  PLT 229   BMET  Recent Labs  09/27/13 0550 09/28/13 0640 09/29/13 0500  NA 141 139 142  K 3.5* 3.6* 3.4*  CL 99 98 97  CO2 28 22 19   GLUCOSE 169* 201* 200*  BUN 7 8 9   CREATININE 0.91 0.87 0.90  CALCIUM 9.0 8.7 9.1   LFT  Recent Labs  09/28/13 0640  PROT 6.9  ALBUMIN 3.5  AST 8  ALT 8  ALKPHOS 79  BILITOT 0.5   Assessment / Plan: *Frequent acute bouts of n/v/upper abdominal pain due to gastroparesis. This despite compliance with home GI med regimen.  05/2013 EGD showed only showed HH.  *Type 1 DM, not well-controlled.  *? Transverse colitis on CT 05/2013: Flex sig negative.  *Constipation:  Had multiple BM's after SMOG enema on 8/15.   -Optimization of glycemic control is essential, both short and long term.  -Avoid, or at least minimize, opioid pain medications and anticholinergics.  He is still taking Dilaudid frequently.  -RTC IV Phenergan, RTC IV Reglan 10 mg and  daily PPI (changed to IV) for now.  Zofran prn. -Consider IV erythromycin, IV ativan if no turn around in the next 24 hours.  -Consider outpatient referral to [facility]ill [facility].  -Outpatient follow up with an endocrinologist if he is not already seeing one. -Long term oral laxative regimen such as Miralax qd to bid when he is taking PO adequately to help prevent constipation.    LOS: 8 days   ZEHR, JESSICA D.  09/29/2013, 9:13 AM  Pager number [phone]     Attending physician's note   I have taken an interval history, reviewed the chart and examined the patient. I agree with the Advanced Practitioner's note, impression and recommendations. Primary service to attempt to discontinue Dilaudid and to optimize glycemic control, otherwise continue current mgmt.  Pricilla Riffle. Fuller Plan, MD [facility]

## 2013-09-29 NOTE — Progress Notes (Signed)
Patient stated he needed something for pain, I told him his pain medication had been changed to q6 hours, patient states that is not good as he is hurting now and that will not help him get better.  I notified MD of patients concerns.

## 2013-09-30 LAB — GLUCOSE, CAPILLARY
GLUCOSE-CAPILLARY: 172 mg/dL — AB (ref 70–99)
GLUCOSE-CAPILLARY: 163 mg/dL — AB (ref 70–99)
Glucose-Capillary: 163 mg/dL — ABNORMAL HIGH (ref 70–99)
Glucose-Capillary: 182 mg/dL — ABNORMAL HIGH (ref 70–99)
Glucose-Capillary: 206 mg/dL — ABNORMAL HIGH (ref 70–99)

## 2013-09-30 LAB — BASIC METABOLIC PANEL
Anion gap: 17 — ABNORMAL HIGH (ref 5–15)
BUN: 11 mg/dL (ref 6–23)
CO2: 25 mEq/L (ref 19–32)
CREATININE: 1.02 mg/dL (ref 0.50–1.35)
Calcium: 8.9 mg/dL (ref 8.4–10.5)
Chloride: 99 mEq/L (ref 96–112)
Glucose, Bld: 189 mg/dL — ABNORMAL HIGH (ref 70–99)
Potassium: 3.8 mEq/L (ref 3.7–5.3)
Sodium: 141 mEq/L (ref 137–147)

## 2013-09-30 MED ORDER — LORAZEPAM 2 MG/ML IJ SOLN
1.0000 mg | Freq: Once | INTRAMUSCULAR | Status: AC
  Administered 2013-09-30: 1 mg via INTRAVENOUS
  Filled 2013-09-30: qty 1

## 2013-09-30 MED ORDER — SODIUM CHLORIDE 0.9 % IV SOLN
500.0000 mg | Freq: Four times a day (QID) | INTRAVENOUS | Status: DC
  Administered 2013-09-30 – 2013-10-01 (×5): 500 mg via INTRAVENOUS
  Filled 2013-09-30 (×7): qty 10

## 2013-09-30 MED ORDER — CHLORPROMAZINE HCL 25 MG/ML IJ SOLN
25.0000 mg | Freq: Two times a day (BID) | INTRAMUSCULAR | Status: DC
  Administered 2013-09-30: 25 mg via INTRAMUSCULAR
  Filled 2013-09-30 (×3): qty 1

## 2013-09-30 MED ORDER — METOPROLOL TARTRATE 1 MG/ML IV SOLN
2.5000 mg | Freq: Four times a day (QID) | INTRAVENOUS | Status: DC
  Administered 2013-10-01: via INTRAVENOUS
  Administered 2013-10-01 – 2013-10-05 (×18): 2.5 mg via INTRAVENOUS
  Filled 2013-09-30 (×22): qty 5

## 2013-09-30 NOTE — Progress Notes (Signed)
[facility] PROGRESS NOTE  Duane [name] [id number] DOB: [birth year] DOA: 09/21/2013 PCP: [name], MD   52 ?, h/o IDDM C Neuropathy/gastropresis-recent admission 7/16-7/18 for same issue and multiple admissions this year-nl flex sig 4/30, UGI mid sized hiatal hernia admitted 09/22/13 with N/V  Assessment/Plan:  #1 nausea /vomiting/ abdominal pain/gastroparesis Likely secondary to constipation versus gastroparesis. Patient with mild improvement only.  Still on clears.  States needs pain meds for his gastroparesis and pain everywhere else.  Have encouraged him to cut back in am and have told him this will probably happen in the next day or so-rest per GI I have started IV EEC 500 q6 on 8/19  #2 constipation Patient received SMOG enema with good results. X-ray showed moderate to severe stool in the colon.   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 CBGs currently ranging from 167-182. Continue SSI. Start Lantus 10 units daily.  #4 hypertension Stable. Continue IV enalapril and lopressor.  #5 prophylaxis SCDs for DVT prophylaxis.    Code Status: Full  Family Communication: Updated patient at bedside. Disposition Plan: Home when medically stable.   Consultants:  GI: Dr. Fuller Plan 09/28/13  Procedures:  Acute abdominal series 09/22/2013  KUB 09/25/2013  Antibiotics:  None  HPI/Subjective:  Sleepy Has some abd pain No n/v Has not really had a full meal yet No CP  Objective: Filed Vitals:   09/30/13 0533  BP: 115/78  Pulse: 100  Temp: 98.8 F (37.1 C)  Resp: 18    Intake/Output Summary (Last 24 hours) at 09/30/13 1252 Last data filed at 09/30/13 0600  Gross per 24 hour  Intake   2400 ml  Output   1103 ml  Net   1297 ml   Filed Weights   09/22/13 0425  Weight: 124.6 kg (274 lb 11.1 oz)    Exam:   General:  NAD  Cardiovascular: RRR  Respiratory:  CTAB  Abdomen: Soft, some tenderness to palpation, nondistended, hypoactive bowel sounds.  Musculoskeletal: No clubbing cyanosis or edema  Data Reviewed: Basic Metabolic Panel:  Recent Labs Lab 09/26/13 0605 09/27/13 0550 09/28/13 0640 09/29/13 0500 09/30/13 0524  NA 140 141 139 142 141  K 4.1 3.5* 3.6* 3.4* 3.8  CL 100 99 98 97 99  CO2 24 28 22 19 25   GLUCOSE 168* 169* 201* 200* 189*  BUN 10 7 8 9 11   CREATININE 0.96 0.91 0.87 0.90 1.02  CALCIUM 9.0 9.0 8.7 9.1 8.9  MG  --  1.8  --   --   --    Liver Function Tests:  Recent Labs Lab 09/28/13 0640  AST 8  ALT 8  ALKPHOS 79  BILITOT 0.5  PROT 6.9  ALBUMIN 3.5   No results found for this basename: LIPASE, AMYLASE,  in the last 168 hours No results found for this basename: AMMONIA,  in the last 168 hours CBC:  Recent Labs Lab 09/24/13 0520 09/26/13 0605 09/29/13 0500  WBC 6.6 8.5 8.7  HGB 12.2* 13.1 14.1  HCT 36.1* 38.2* 40.2  MCV 78.6 77.8* 77.5*  PLT 242 PLATELET CLUMPS NOTED ON SMEAR, COUNT APPEARS ADEQUATE 229   Cardiac Enzymes: No results found for this basename: CKTOTAL, CKMB, CKMBINDEX, TROPONINI,  in the last 168 hours BNP (last 3 results) No results found for this basename: PROBNP,  in the last 8760 hours CBG:  Recent Labs Lab 09/29/13 1138 09/29/13 1624 09/29/13 2049 09/30/13 0741 09/30/13 1200  GLUCAP 184* 167* 172*  163* 182*    No results found for this or any previous visit (from the past 240 hour(s)).   Studies: No results found.  Scheduled Meds: . aspirin EC  81 mg Oral Daily  . docusate sodium  100 mg Oral BID  . enalaprilat  1.25 mg Intravenous 4 times per day  . feeding supplement (RESOURCE BREEZE)  1 Container Oral BID BM  . insulin aspart  0-5 Units Subcutaneous QHS  . insulin aspart  0-9 Units Subcutaneous TID WC  . insulin glargine  10 Units Subcutaneous Daily  . metoCLOPramide (REGLAN) injection  10 mg Intravenous 4 times per day  . metoprolol  5 mg Intravenous 4  times per day  . pantoprazole (PROTONIX) IV  40 mg Intravenous Daily  . polyethylene glycol  17 g Oral BID  . promethazine  25 mg Intravenous 4 times per day  . sodium chloride  3 mL Intravenous Q12H   Continuous Infusions: . sodium chloride 100 mL/hr at 09/29/13 2123    Principal Problem:   Intractable nausea and vomiting Active Problems:   IDDM (insulin dependent diabetes mellitus)   HTN (hypertension)   Diabetic gastroparesis   Diabetic neuropathy   Unspecified constipation    Time spent: 35 min   [name], MD [facility] ([facility]) [phone]

## 2013-09-30 NOTE — Progress Notes (Signed)
     [facility] Progress Note  Subjective:  Tells me that he is feeling a little better.  Tolerating some clear liquids in small amounts.  He is falling asleep while sitting up in the chair and only says a few words to me between dozing off.  Objective:  Vital signs in last 24 hours: Temp:  [98.2 F (36.8 C)-98.8 F (37.1 C)] 98.8 F (37.1 C) (08/19 0533) Pulse Rate:  [95-118] 100 (08/19 0533) Resp:  [18-20] 18 (08/19 0533) BP: (115-177)/(78-104) 115/78 mmHg (08/19 0533) SpO2:  [98 %-99 %] 98 % (08/19 0533) Last BM Date: 09/28/13 General:  Alert, Well-developed, in NAD Heart:  Regular rate and rhythm; no murmurs Pulm:  CTAB.  No W/R/R. Abdomen:  Soft, non-distended. Normal bowel sounds.  Non-tender. Extremities:  Without edema. Neurologic:  Alert, but drowsy and falling asleep in mid conversation;  grossly normal neurologically.  Intake/Output from previous day: 08/18 0701 - 08/19 0700 In: 2460 [P.O.:60; I.V.:2400] Out: 1103 [Urine:1100; Emesis/NG output:3]  Lab Results:  Recent Labs  09/29/13 0500  WBC 8.7  HGB 14.1  HCT 40.2  PLT 229   BMET  Recent Labs  09/28/13 0640 09/29/13 0500 09/30/13 0524  NA 139 142 141  K 3.6* 3.4* 3.8  CL 98 97 99  CO2 22 19 25   GLUCOSE 201* 200* 189*  BUN 8 9 11   CREATININE 0.87 0.90 1.02  CALCIUM 8.7 9.1 8.9   LFT  Recent Labs  09/28/13 0640  PROT 6.9  ALBUMIN 3.5  AST 8  ALT 8  ALKPHOS 79  BILITOT 0.5   Assessment / Plan: *Frequent acute bouts of n/v/upper abdominal pain due to gastroparesis. This despite compliance with home GI med regimen. 05/2013 EGD showed only showed HH.  *Type 1 DM, not well-controlled.  *? Transverse colitis on CT 05/2013: Flex sig negative.  *Constipation: Had multiple BM's after SMOG enema on 8/15.   -Optimization of glycemic control is essential, both short and long term.  -Avoid, or at least minimize, opioid pain medications and anticholinergics. He is still taking  Dilaudid dose is being decreased by primary service.  -RTC IV Phenergan, RTC IV Reglan 10 mg and daily PPI (changed to IV) for now. Zofran prn.  -Could still consider IV erythromycin, IV ativan.  -Consider outpatient referral to Dr. Derrill Kay at [facility].  -Outpatient follow up with an endocrinologist if he is not already seeing one.  -Long term oral laxative regimen such as Miralax qd to bid when he is taking PO adequately to help prevent constipation.     LOS: 9 days   ZEHR, JESSICA D.  09/30/2013, 10:15 AM  Pager number [phone]     Attending physician's note   I have taken an interval history, reviewed the chart and examined the patient. I agree with the Advanced Practitioner's note, impression and recommendations. Slowly improving. Appears somnolent, fatigued or sedated. Attempt to DC Dilaudid completely, defer to primary service. May be able to decrease Phenergan to 12.5 mg IV q6h if he remains somnolent and his N/V is controlled. Begin Miralax when taking PO better.   Pricilla Riffle. Fuller Plan, MD [facility]

## 2013-09-30 NOTE — Progress Notes (Signed)
Patient had near syncopal episode, in which he got really dizzy while standing.  Orthostatic vitals checked, and Dr. Verlon Au notified.  Durwin Nora RN

## 2013-10-01 LAB — GLUCOSE, CAPILLARY
GLUCOSE-CAPILLARY: 182 mg/dL — AB (ref 70–99)
GLUCOSE-CAPILLARY: 277 mg/dL — AB (ref 70–99)
Glucose-Capillary: 216 mg/dL — ABNORMAL HIGH (ref 70–99)
Glucose-Capillary: 172 mg/dL — ABNORMAL HIGH (ref 70–99)

## 2013-10-01 MED ORDER — HYDRALAZINE HCL 20 MG/ML IJ SOLN
5.0000 mg | INTRAMUSCULAR | Status: DC | PRN
  Administered 2013-10-01: 21:00:00 via INTRAVENOUS
  Filled 2013-10-01: qty 1

## 2013-10-01 MED ORDER — PROMETHAZINE HCL 25 MG/ML IJ SOLN
12.5000 mg | Freq: Four times a day (QID) | INTRAMUSCULAR | Status: DC
  Administered 2013-10-01 – 2013-10-06 (×21): 12.5 mg via INTRAVENOUS
  Filled 2013-10-01 (×28): qty 1

## 2013-10-01 MED ORDER — LIVING WELL WITH DIABETES BOOK
Freq: Once | Status: AC
  Administered 2013-10-01: 15:00:00
  Filled 2013-10-01: qty 1

## 2013-10-01 MED ORDER — HYDROCODONE-ACETAMINOPHEN 5-325 MG PO TABS
1.0000 | ORAL_TABLET | ORAL | Status: DC | PRN
  Administered 2013-10-02 (×2): 1 via ORAL
  Filled 2013-10-01 (×3): qty 1

## 2013-10-01 NOTE — Progress Notes (Signed)
     [facility] Progress Note  Subjective:  Improving slowly.  No vomiting since last AM.  Still complaining of upper abdominal pain.  Objective:  Vital signs in last 24 hours: Temp:  [98 F (36.7 C)-99.2 F (37.3 C)] 98.8 F (37.1 C) (08/20 0522) Pulse Rate:  [88-128] 88 (08/20 0522) Resp:  [18-20] 18 (08/20 0522) BP: (74-167)/(42-95) 145/77 mmHg (08/20 0522) SpO2:  [96 %-98 %] 98 % (08/20 0522) Last BM Date: 09/30/13 General:  Alert, Well-developed, in NAD Heart:  Regular rate and rhythm; no murmurs Pulm:  CTAB.  No W/R/R. Abdomen:  Soft, non-distended.  Normal bowel sounds.  Mild epigastric TTP without R/R/G. Extremities:  Without edema. Neurologic:  Alert and  oriented x4;  grossly normal neurologically. Psych:  Alert and cooperative. Normal mood and affect.  Intake/Output from previous day: 08/19 0701 - 08/20 0700 In: 2620 [P.O.:120; I.V.:2300; IV Piggyback:200] Out: -   Lab Results:  Recent Labs  09/29/13 0500  WBC 8.7  HGB 14.1  HCT 40.2  PLT 229   BMET  Recent Labs  09/29/13 0500 09/30/13 0524  NA 142 141  K 3.4* 3.8  CL 97 99  CO2 19 25  GLUCOSE 200* 189*  BUN 9 11  CREATININE 0.90 1.02  CALCIUM 9.1 8.9   Assessment / Plan: *Frequent acute bouts of n/v/upper abdominal pain due to gastroparesis. This despite compliance with home GI med regimen. 05/2013 EGD showed only showed HH.  *Type 1 DM, not well-controlled.  *? Transverse colitis on CT 05/2013: Flex sig negative.  *Constipation: Had multiple BM's after SMOG enema on 8/15.   -Optimization of glycemic control is essential, both short and long term.  -Avoid, or at least minimize, opioid pain medications and anticholinergics. He is still taking Dilaudid but dose is being decreased by primary service.  -RTC IV Phenergan, RTC IV Reglan 10 mg and daily PPI for now. Zofran prn.  -Consider outpatient referral to Dr. Derrill Kay at [facility].  -Outpatient follow up with an endocrinologist if he is  not already seeing one.  -Long term oral laxative regimen such as Miralax qd to bid when he is taking PO adequately to help prevent constipation.     LOS: 10 days   ZEHR, JESSICA D.  10/01/2013, 9:25 AM  Pager number [phone]     Attending physician's note   I have taken an interval history, reviewed the chart and examined the patient. I agree with the Advanced Practitioner's note, impression and recommendations. Advancing to diabetic diet. Start Miralax for constipation. Changed from Dilaudid to Norco by primary service. Continue other medications.   Pricilla Riffle. Fuller Plan, MD [facility]

## 2013-10-01 NOTE — Progress Notes (Signed)
Results for VIRGINIO, MCAULEY (MRN [id number]) as of 10/01/2013 13:00  Ref. Range 09/30/2013 07:41 09/30/2013 12:00 09/30/2013 16:25 09/30/2013 21:27 10/01/2013 11:45  Glucose-Capillary Latest Range: 70-99 mg/dL 163 (H) 182 (H) 206 (H) 163 (H) 277 (H)  CBGs rising to greater than 200 mg/dl.  No fasting CBG available today.  If CBGs continue to be greater than 180 mg/dl,  recommend increasing Lantus to 15-20 units daily and may need to increase Novolog correction scale to MODERATE.  Will continue to follow while in hospital.  Harvel Ricks RN BSN CDE

## 2013-10-01 NOTE — Progress Notes (Signed)
[facility] PROGRESS NOTE  Duane [name] [id number] DOB: May 22, 1975 DOA: 09/21/2013 PCP: [name], MD   49 ?, h/o IDDM C Neuropathy/gastropresis-recent admission 7/16-7/18 for same issue and multiple admissions this year-nl flex sig 4/30, UGI mid sized hiatal hernia admitted 09/22/13 with N/V  Assessment/Plan:  #1 nausea /vomiting/ abdominal pain/gastroparesis Likely secondary to constipation versus gastroparesis. Patient with mild improvement only.  Still on clears.  States needs pain meds for his gastroparesis and pain everywhere else.   IV dilaudid to PO norco q 4 prn I have started IV EEC 500 q6 on 8/19 Graduated to Diabetic diet at his request-encouraged to back down to liquids if n/v Appreciate input from GI Cut back IV from 100--> 50 cc/hr  #2 constipation Patient received SMOG enema with good results. X-ray showed moderate to severe stool in the colon.   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 CBGs currently ranging from 163-182. Continue SSI. Start Lantus 10 units daily.  #4 hypertension Stable. Continue IV enalapril and lopressor.  Orthostatic hypotension Unclear precipitant-on no meds that would cause this Encouraged to get OOB slowly, ambulate in room OOB with meals   Code Status: Full  Family Communication: Updated patient at bedside. Disposition Plan: Home when medically stable.   Consultants:  GI: Dr. Fuller Plan 09/28/13  Procedures:  Acute abdominal series 09/22/2013  KUB 09/25/2013  Antibiotics:  None  HPI/Subjective:  Doing fair.  Feels he can eat No n/v/cp Denies SOB  Objective: Filed Vitals:   10/01/13 0932  BP: 101/43  Pulse: 121  Temp:   Resp:     Intake/Output Summary (Last 24 hours) at 10/01/13 1045 Last data filed at 10/01/13 0500  Gross per 24 hour  Intake   2500 ml  Output      0 ml  Net   2500 ml   Filed Weights   09/22/13  0425  Weight: 124.6 kg (274 lb 11.1 oz)    Exam:   General:  NAD  Cardiovascular: RRR  Respiratory: CTAB  Abdomen: Soft, some tenderness to palpation, nondistended, hypoactive bowel sounds.  Musculoskeletal: No clubbing cyanosis or edema  Data Reviewed: Basic Metabolic Panel:  Recent Labs Lab 09/26/13 0605 09/27/13 0550 09/28/13 0640 09/29/13 0500 09/30/13 0524  NA 140 141 139 142 141  K 4.1 3.5* 3.6* 3.4* 3.8  CL 100 99 98 97 99  CO2 24 28 22 19 25   GLUCOSE 168* 169* 201* 200* 189*  BUN 10 7 8 9 11   CREATININE 0.96 0.91 0.87 0.90 1.02  CALCIUM 9.0 9.0 8.7 9.1 8.9  MG  --  1.8  --   --   --    Liver Function Tests:  Recent Labs Lab 09/28/13 0640  AST 8  ALT 8  ALKPHOS 79  BILITOT 0.5  PROT 6.9  ALBUMIN 3.5   No results found for this basename: LIPASE, AMYLASE,  in the last 168 hours No results found for this basename: AMMONIA,  in the last 168 hours CBC:  Recent Labs Lab 09/26/13 0605 09/29/13 0500  WBC 8.5 8.7  HGB 13.1 14.1  HCT 38.2* 40.2  MCV 77.8* 77.5*  PLT PLATELET CLUMPS NOTED ON SMEAR, COUNT APPEARS ADEQUATE 229   Cardiac Enzymes: No results found for this basename: CKTOTAL, CKMB, CKMBINDEX, TROPONINI,  in the last 168 hours BNP (last 3 results) No results found for this basename: PROBNP,  in the last 8760 hours CBG:  Recent Labs Lab 09/29/13 2049 09/30/13 0741  09/30/13 1200 09/30/13 1625 09/30/13 2127  GLUCAP 172* 163* 182* 206* 163*    No results found for this or any previous visit (from the past 240 hour(s)).   Studies: No results found.  Scheduled Meds: . aspirin EC  81 mg Oral Daily  . docusate sodium  100 mg Oral BID  . erythromycin  500 mg Intravenous Q6H  . feeding supplement (RESOURCE BREEZE)  1 Container Oral BID BM  . insulin aspart  0-5 Units Subcutaneous QHS  . insulin aspart  0-9 Units Subcutaneous TID WC  . insulin glargine  10 Units Subcutaneous Daily  . metoCLOPramide (REGLAN) injection  10 mg  Intravenous 4 times per day  . metoprolol  2.5 mg Intravenous 4 times per day  . pantoprazole (PROTONIX) IV  40 mg Intravenous Daily  . polyethylene glycol  17 g Oral BID  . promethazine  12.5 mg Intravenous 4 times per day  . sodium chloride  3 mL Intravenous Q12H   Continuous Infusions: . sodium chloride 100 mL/hr at 09/30/13 1505    Principal Problem:   Intractable nausea and vomiting Active Problems:   IDDM (insulin dependent diabetes mellitus)   HTN (hypertension)   Diabetic gastroparesis   Diabetic neuropathy   Unspecified constipation    Time spent: 35 min   [name], MD [facility] (P) [phone]

## 2013-10-02 LAB — GLUCOSE, CAPILLARY
GLUCOSE-CAPILLARY: 201 mg/dL — AB (ref 70–99)
Glucose-Capillary: 208 mg/dL — ABNORMAL HIGH (ref 70–99)
Glucose-Capillary: 188 mg/dL — ABNORMAL HIGH (ref 70–99)
Glucose-Capillary: 194 mg/dL — ABNORMAL HIGH (ref 70–99)

## 2013-10-02 MED ORDER — METOCLOPRAMIDE HCL 5 MG/ML IJ SOLN
20.0000 mg | Freq: Four times a day (QID) | INTRAVENOUS | Status: DC
  Administered 2013-10-02 – 2013-10-06 (×17): 20 mg via INTRAVENOUS
  Filled 2013-10-02 (×18): qty 4

## 2013-10-02 MED ORDER — HYDROCODONE-ACETAMINOPHEN 5-325 MG PO TABS
1.0000 | ORAL_TABLET | ORAL | Status: DC
  Administered 2013-10-02 – 2013-10-06 (×18): 1 via ORAL
  Filled 2013-10-02 (×19): qty 1

## 2013-10-02 MED ORDER — DEXTROSE 5 % IV SOLN
INTRAVENOUS | Status: DC
  Administered 2013-10-02 – 2013-10-03 (×2): via INTRAVENOUS

## 2013-10-02 MED ORDER — SODIUM CHLORIDE 0.9 % IV SOLN
500.0000 mg | Freq: Four times a day (QID) | INTRAVENOUS | Status: DC
  Administered 2013-10-02 – 2013-10-06 (×16): 500 mg via INTRAVENOUS
  Filled 2013-10-02 (×17): qty 10

## 2013-10-02 MED ORDER — HYDROMORPHONE HCL PF 1 MG/ML IJ SOLN
1.0000 mg | INTRAMUSCULAR | Status: DC | PRN
  Administered 2013-10-02 – 2013-10-04 (×10): 1 mg via INTRAVENOUS
  Filled 2013-10-02 (×10): qty 1

## 2013-10-02 MED ORDER — PROMETHAZINE HCL 25 MG/ML IJ SOLN
25.0000 mg | Freq: Once | INTRAMUSCULAR | Status: AC
  Administered 2013-10-02: 25 mg via INTRAVENOUS
  Filled 2013-10-02: qty 1

## 2013-10-02 MED ORDER — INSULIN ASPART 100 UNIT/ML ~~LOC~~ SOLN
0.0000 [IU] | SUBCUTANEOUS | Status: DC
  Administered 2013-10-02 (×2): 2 [IU] via SUBCUTANEOUS
  Administered 2013-10-02: 3 [IU] via SUBCUTANEOUS
  Administered 2013-10-03 (×2): 2 [IU] via SUBCUTANEOUS
  Administered 2013-10-03: 5 [IU] via SUBCUTANEOUS
  Administered 2013-10-03 – 2013-10-04 (×5): 2 [IU] via SUBCUTANEOUS
  Administered 2013-10-04: 3 [IU] via SUBCUTANEOUS
  Administered 2013-10-04 – 2013-10-05 (×5): 2 [IU] via SUBCUTANEOUS
  Administered 2013-10-05 (×2): 1 [IU] via SUBCUTANEOUS
  Administered 2013-10-05: 3 [IU] via SUBCUTANEOUS
  Administered 2013-10-05: 5 [IU] via SUBCUTANEOUS
  Administered 2013-10-06 (×2): 2 [IU] via SUBCUTANEOUS
  Administered 2013-10-06: 1 [IU] via SUBCUTANEOUS
  Administered 2013-10-06: 2 [IU] via SUBCUTANEOUS
  Administered 2013-10-06: 3 [IU] via SUBCUTANEOUS

## 2013-10-02 MED ORDER — PROMETHAZINE HCL 25 MG/ML IJ SOLN: 25.0000 mg | Freq: Once | INTRAMUSCULAR | Status: DC

## 2013-10-02 MED ORDER — HYDROMORPHONE HCL PF 1 MG/ML IJ SOLN: 1.0000 mg | INTRAMUSCULAR | Status: DC | PRN

## 2013-10-02 NOTE — Progress Notes (Signed)
[facility] PROGRESS NOTE  Duane [name] [id number] DOB: October 01, 1975 DOA: 09/21/2013 PCP: [name], MD   45 ?, h/o IDDM C Neuropathy/gastropresis-recent admission 7/16-7/18 for same issue and multiple admissions this year-nl flex sig 4/30, UGI mid sized hiatal hernia admitted 09/22/13 with N/V.    Assessment/Plan:  #1 nausea /vomiting/ abdominal pain/gastroparesis Likely secondary to constipation versus gastroparesis. Not improving. Sig abs pain 2/2 to vomiting for which I have had to re-start IV dilaudid q 2 prn for now I have started IV EEC 500 q6 on 8/19--I had held it as we had grad his diet which he unfortunately wasn't able to tolerate 8/20.  Re-started 8/21 Defer to GI other options? Backed down to [employer], D5% water 50 cc/hour  #2 constipation Patient received SMOG enema with good results. X-ray showed moderate to severe stool in the colon.   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 CBGs currently ranging from 208-277. Continue SSI. Start Lantus 10 units daily.  #4 hypertension Stable. Continue IV enalapril and lopressor.  Orthostatic hypotension Unclear precipitant-on no meds that would cause this Encouraged to get OOB slowly, ambulate in room OOB with meals   Code Status: Full  Family Communication: Updated patient at bedside. Disposition Plan: Home when medically stable.   Consultants:  GI: Dr. Fuller Plan 09/28/13  Procedures:  Acute abdominal series 09/22/2013  KUB 09/25/2013  Antibiotics:  None  HPI/Subjective:  Feels very poorly Very nauseous and vomiting green matter at bedside Upset pain meds had been adjusted which have now been added back [IV dilaudid]  Objective: Filed Vitals:   10/02/13 0541  BP: 147/87  Pulse: 102  Temp: 98.8 F (37.1 C)  Resp: 16    Intake/Output Summary (Last 24 hours) at 10/02/13 0941 Last data filed at 10/02/13 0811  Gross per 24 hour  Intake 886.67 ml  Output    465 ml  Net 421.67 ml   Filed Weights   09/22/13 0425  Weight: 124.6 kg (274 lb 11.1 oz)    Exam:   General:  NAD  Cardiovascular: RRR  Respiratory: CTAB  Abdomen: Soft, some tenderness to palpation, nondistended, hypoactive bowel sounds.  Musculoskeletal: No clubbing cyanosis or edema  Data Reviewed: Basic Metabolic Panel:  Recent Labs Lab 09/26/13 0605 09/27/13 0550 09/28/13 0640 09/29/13 0500 09/30/13 0524  NA 140 141 139 142 141  K 4.1 3.5* 3.6* 3.4* 3.8  CL 100 99 98 97 99  CO2 24 28 22 19 25   GLUCOSE 168* 169* 201* 200* 189*  BUN 10 7 8 9 11   CREATININE 0.96 0.91 0.87 0.90 1.02  CALCIUM 9.0 9.0 8.7 9.1 8.9  MG  --  1.8  --   --   --    Liver Function Tests:  Recent Labs Lab 09/28/13 0640  AST 8  ALT 8  ALKPHOS 79  BILITOT 0.5  PROT 6.9  ALBUMIN 3.5   No results found for this basename: LIPASE, AMYLASE,  in the last 168 hours No results found for this basename: AMMONIA,  in the last 168 hours CBC:  Recent Labs Lab 09/26/13 0605 09/29/13 0500  WBC 8.5 8.7  HGB 13.1 14.1  HCT 38.2* 40.2  MCV 77.8* 77.5*  PLT PLATELET CLUMPS NOTED ON SMEAR, COUNT APPEARS ADEQUATE 229   Cardiac Enzymes: No results found for this basename: CKTOTAL, CKMB, CKMBINDEX, TROPONINI,  in the last 168 hours BNP (last 3 results) No results found for this basename: PROBNP,  in the  last 8760 hours CBG:  Recent Labs Lab 10/01/13 0912 10/01/13 1145 10/01/13 1722 10/01/13 2059 10/02/13 0748  GLUCAP 182* 277* 216* 172* 208*    No results found for this or any previous visit (from the past 240 hour(s)).   Studies: No results found.  Scheduled Meds: . aspirin EC  81 mg Oral Daily  . docusate sodium  100 mg Oral BID  . erythromycin  500 mg Intravenous 4 times per day  . feeding supplement (RESOURCE BREEZE)  1 Container Oral BID BM  . HYDROcodone-acetaminophen  1 tablet Oral Q4H  . insulin aspart  0-5 Units  Subcutaneous QHS  . insulin aspart  0-9 Units Subcutaneous TID WC  . insulin glargine  10 Units Subcutaneous Daily  . metoCLOPramide (REGLAN) injection  10 mg Intravenous 4 times per day  . metoprolol  2.5 mg Intravenous 4 times per day  . pantoprazole (PROTONIX) IV  40 mg Intravenous Daily  . polyethylene glycol  17 g Oral BID  . promethazine  12.5 mg Intravenous 4 times per day  . promethazine  25 mg Intravenous Once  . promethazine  25 mg Intravenous Once  . sodium chloride  3 mL Intravenous Q12H   Continuous Infusions: . dextrose      Principal Problem:   Intractable nausea and vomiting Active Problems:   IDDM (insulin dependent diabetes mellitus)   HTN (hypertension)   Diabetic gastroparesis   Diabetic neuropathy   Unspecified constipation    Time spent: 35 min   [name], MD [facility] (P) [phone]

## 2013-10-02 NOTE — Progress Notes (Signed)
Nutrition Educate Note  Received page from GI requesting gastroparesis diet education. Spoke with pt briefly about gastroparesis 8/18 however reviewed it in more detail with pt today and provided handouts on diet therapy for gastroparesis with RD contact information. Emphasized, once diet advanced, small frequent bland and low fiber meal intake. Pt kept eyes closed during visit but was able to verbalize understanding.  Carlis Stable MS, [place], LDN [phone] Pager [phone] Weekend/After Hours Pager

## 2013-10-02 NOTE — Progress Notes (Signed)
(  1900-0700) Intermittent nausea/vomiting throughout shift, which started at 1700.  Patient requesting iv dilauded at 1900.  MD notified of symptoms, however, did not order requested medication.   Patient requested to see Nursing supervisor during this time, however was not satisfied with AC's response, then, requested to see Md. However, Md declined to assess patient. Patient on standing Reglan/phenergan q6h,which provided moderate nausea relief. Vicodin 1 tab initially refused, subsequently taken at 0223 with moderate relief. Patient continued to request dilauded throughout shift, at which time I reinforced Md's initial response.  Sleeping at present, contionue to monitior.

## 2013-10-02 NOTE — Progress Notes (Addendum)
     [facility] Progress Note  Subjective: Per nursing note, pt c/o n/v throughout evening shift and requested dilaudid repeatedly. Pt had been changed to norco po yesterday, which he took at 0223 with moderate relief. His most recent hgb A1C on 09/21/13 was 9.3, and was 9.2 1 month ago and 9.0 4 months ago. Pt continues to complain of nausea and waves of abd pain. He ate a whole [place] club last night and shortly thereafter felt uncomfortably full and vomited. Although we had discussed small volume, frequent feedings, he didn't think a club sandwich would pose a problem, but he seems to understand the importance of smaller more frequent feeding this morning. Has not had a bowel movement since Wednesday.  Objective:  Vital signs in last 24 hours: Temp:  [98.8 F (37.1 C)-99.4 F (37.4 C)] 98.8 F (37.1 C) (08/21 0541) Pulse Rate:  [88-121] 102 (08/21 0541) Resp:  [16-18] 16 (08/21 0541) BP: (101-169)/(43-110) 147/87 mmHg (08/21 0541) SpO2:  [99 %] 99 % (08/20 1500) Last BM Date: 09/30/13 General:   Alert,  Well-developed,    in NAD Heart:  Regular rate and rhythm; no murmurs Pulm; Abdomen:  Soft,mild diffuse tenderness. Normal bowel sounds, without guarding, and without rebound.   Extremities:  Without edema. Neurologic:  Alert and  oriented x4;  grossly normal neurologically. Psych:  Alert and cooperative. Normal mood and affect.  Intake/Output from previous day: 08/20 0701 - 08/21 0700 In: 886.7 [I.V.:786.7; IV Piggyback:100] Out: 450 [Urine:450] Intake/Output this shift: Total I/O In: -  Out: 15 [Emesis/NG output:15]  Lab Results: No results found for this basename: WBC, HGB, HCT, PLT,  in the last 72 hours BMET  Recent Labs  09/30/13 0524  NA 141  K 3.8  CL 99  CO2 25  GLUCOSE 189*  BUN 11  CREATININE 1.02  CALCIUM 8.9     Assessment / Plan: *Frequent acute bouts of n/v/upper abdominal pain due to gastroparesis. Questionable compliance with home  GI med regimen. 05/2013 EGD showed only showed HH.  *Type 1 DM, not well-controlled.  *Constipation: Had multiple BM's after SMOG enema on 8/15.    -Optimization of glycemic control is essential, both short and long term.  -Avoid, or at least minimize, opioid pain medications and anticholinergics. He is currently on norco as per medical service. -Continue zofran, phenergan, reglan, PPI, EES. ?Trial of ativan for nausea? -Consider referral to Dr. Derrill Kay at [facility].  -Outpatient follow up with an endocrinologist if he is not already seeing one.  -Long term oral laxative regimen such as Miralax qd to bid to help prevent constipation     LOS: 11 days   Vita Barley HvozdovicPA-C  10/02/2013      Attending physician's note   I have taken an interval history, reviewed the chart and examined the patient. I agree with the Advanced Practitioner's note, impression and recommendations. Had recurrent N/V after eating solid foods. Return to clears then advance to full liquids. Patient continues to take pain medications for subjective pain without clear objective evidence of pain - ? pain med seeking behavior. RD to see patient for gastroparesis diet. Trial of adding Ativan IV. Consider inpatient transfer to [facility] for refractory gastroparesis.  Pricilla Riffle. Fuller Plan, MD [facility]

## 2013-10-03 LAB — GLUCOSE, CAPILLARY
GLUCOSE-CAPILLARY: 180 mg/dL — AB (ref 70–99)
GLUCOSE-CAPILLARY: 194 mg/dL — AB (ref 70–99)
GLUCOSE-CAPILLARY: 197 mg/dL — AB (ref 70–99)
GLUCOSE-CAPILLARY: 199 mg/dL — AB (ref 70–99)
Glucose-Capillary: 182 mg/dL — ABNORMAL HIGH (ref 70–99)
Glucose-Capillary: 171 mg/dL — ABNORMAL HIGH (ref 70–99)
Glucose-Capillary: 267 mg/dL — ABNORMAL HIGH (ref 70–99)

## 2013-10-03 MED ORDER — SUCRALFATE 1 G PO TABS
1.0000 g | ORAL_TABLET | Freq: Three times a day (TID) | ORAL | Status: DC
  Administered 2013-10-03 – 2013-10-06 (×12): 1 g via ORAL
  Filled 2013-10-03 (×15): qty 1

## 2013-10-03 NOTE — Progress Notes (Signed)
     [facility] Progress Note  Subjective:  Declined transfer to [facility] that they would just do the same for him there as we are here.  Says that his episodes always are slow to get better, but this one has definitely taken longer.  He is still on clear liquid diet.  Objective:  Vital signs in last 24 hours: Temp:  [98.3 F (36.8 C)-99 F (37.2 C)] 98.8 F (37.1 C) (08/22 0509) Pulse Rate:  [94-111] 111 (08/22 0509) Resp:  [18] 18 (08/22 0509) BP: (152-182)/(99-111) 152/101 mmHg (08/22 0509) SpO2:  [95 %-99 %] 99 % (08/22 0509) Last BM Date: 09/30/13 General:  Alert, Well-developed, in NAD Heart:  Tachy but regular rhythm; no murmurs Pulm:  CTAB.  No W/R/R. Abdomen:  Soft, non-distended. Normal bowel sounds.  Mild upper abdominal TTP without R/R/G. Extremities:  Without edema. Neurologic:  Alert and  oriented x4;  grossly normal neurologically. Psych:  Alert and cooperative. Normal mood and affect.  Intake/Output from previous day: 08/21 0701 - 08/22 0700 In: 2107 [I.V.:2107] Out: 15 [Emesis/NG output:15]  Assessment / Plan: *Frequent acute bouts of n/v/upper abdominal pain due to gastroparesis. Questionable compliance with home GI med regimen. 05/2013 EGD showed only showed HH.  ? Drug seeking behavior. *Type 1 DM, not well-controlled.  *Constipation: Had multiple BM's after SMOG enema on 8/15.  Started on Miralax twice daily.   -Optimization of glycemic control is essential, both short and long term.  -Avoid, or at least minimize, opioid pain medications and anticholinergics. He is currently on norco and still receiving some Dilaudid as per medical service.  -Continue zofran, phenergan, reglan, PPI.  Erythromycin started 8/21. -Consider referral to Dr. Derrill Kay at [facility]  Patient declined inpatient transfer when discussed with hospitalist yesterday.  -Outpatient follow up with an endocrinologist if he is not already seeing one.     LOS: 12 days   ZEHR,  JESSICA D.  10/03/2013, 8:42 AM  Pager number [phone]     Attending physician's note   I have taken an interval history, reviewed the chart and examined the patient. I agree with the Advanced Practitioner's note, impression and recommendations. Refractory gastroparesis. He initially declined transfer to [facility]. I had a long conversation with him about expertise in gastroparesis at [facility] and he is reconsidering. If he is agreeable later today I recommend inpatient transfer to [facility].  Pricilla Riffle. Fuller Plan, MD [facility]

## 2013-10-03 NOTE — Progress Notes (Signed)
[facility] PROGRESS NOTE  Duane [name] [id number] DOB: 03-01-1975 DOA: 09/21/2013 PCP: [name], MD   28 ?, h/o IDDM C Neuropathy/gastropresis-recent admission 7/16-7/18 for same issue and multiple admissions this year-nl flex sig 4/30, UGI mid sized hiatal hernia admitted 09/22/13 with N/V, found to have gastroparesis and maxd out on all therpaies.  Recommneded Tx to [facility] for further management which he is still thinking about  Assessment/Plan:  #1 nausea /vomiting/ abdominal pain/gastroparesis Likely secondary to constipation versus gastroparesis. Not improving. Sig abs pain 2/2 to vomiting for which I have had to re-start IV dilaudid q 2 prn for now I have started IV EEC 500 q6 on 8/19--I had held it as we had grad his diet which he unfortunately wasn't able to tolerate 8/20.  Re-started 8/21 Backed down to Ice chips, D5% water 50 cc/hour Will try liquids again today Added carafate today 8/22 given some epig discomfort  #2 constipation Patient received SMOG enema with good results. X-ray showed moderate to severe stool in the colon.   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 CBGs currently ranging from 171-199. Continue SSI/ Lantus 10 units daily.  #4 hypertension Stable. Continue IV enalapril and lopressor.  Orthostatic hypotension Unclear precipitant-on no meds that would cause this Encouraged to get OOB slowly, ambulate in room OOB with meals   Code Status: Full  Family Communication: Updated patient at bedside. Disposition Plan: Home when medically stable.   Consultants:  GI: Dr. Fuller Plan 09/28/13  Procedures:  Acute abdominal series 09/22/2013  KUB 09/25/2013  Antibiotics:  None  HPI/Subjective:  Feels a little better N this am Feels like he could do clears No diarrhea hills or fever   Objective: Filed Vitals:   10/03/13 0509  BP: 152/101  Pulse: 111   Temp: 98.8 F (37.1 C)  Resp: 18    Intake/Output Summary (Last 24 hours) at 10/03/13 1245 Last data filed at 10/03/13 0519  Gross per 24 hour  Intake   2107 ml  Output      0 ml  Net   2107 ml   Filed Weights   09/22/13 0425  Weight: 124.6 kg (274 lb 11.1 oz)    Exam:   General:  NAD  Cardiovascular: RRR  Respiratory: CTAB  Abdomen: Soft, some tenderness to palpation, nondistended, hypoactive bowel sounds.  Musculoskeletal: No clubbing cyanosis or edema  Data Reviewed: Basic Metabolic Panel:  Recent Labs Lab 09/27/13 0550 09/28/13 0640 09/29/13 0500 09/30/13 0524  NA 141 139 142 141  K 3.5* 3.6* 3.4* 3.8  CL 99 98 97 99  CO2 28 22 19 25   GLUCOSE 169* 201* 200* 189*  BUN 7 8 9 11   CREATININE 0.91 0.87 0.90 1.02  CALCIUM 9.0 8.7 9.1 8.9  MG 1.8  --   --   --    Liver Function Tests:  Recent Labs Lab 09/28/13 0640  AST 8  ALT 8  ALKPHOS 79  BILITOT 0.5  PROT 6.9  ALBUMIN 3.5   No results found for this basename: LIPASE, AMYLASE,  in the last 168 hours No results found for this basename: AMMONIA,  in the last 168 hours CBC:  Recent Labs Lab 09/29/13 0500  WBC 8.7  HGB 14.1  HCT 40.2  MCV 77.5*  PLT 229   Cardiac Enzymes: No results found for this basename: CKTOTAL, CKMB, CKMBINDEX, TROPONINI,  in the last 168 hours BNP (last 3 results) No results found for this basename: PROBNP,  in the last 8760 hours CBG:  Recent Labs Lab 10/02/13 2009 10/03/13 0004 10/03/13 0401 10/03/13 0731 10/03/13 1229  GLUCAP 201* 197* 182* 171* 199*    No results found for this or any previous visit (from the past 240 hour(s)).   Studies: No results found.  Scheduled Meds: . aspirin EC  81 mg Oral Daily  . docusate sodium  100 mg Oral BID  . erythromycin  500 mg Intravenous 4 times per day  . feeding supplement (RESOURCE BREEZE)  1 Container Oral BID BM  . HYDROcodone-acetaminophen  1 tablet Oral Q4H  . insulin aspart  0-9 Units Subcutaneous  6 times per day  . insulin glargine  10 Units Subcutaneous Daily  . metoCLOPramide (REGLAN) injection  20 mg Intravenous 4 times per day  . metoprolol  2.5 mg Intravenous 4 times per day  . pantoprazole (PROTONIX) IV  40 mg Intravenous Daily  . polyethylene glycol  17 g Oral BID  . promethazine  12.5 mg Intravenous 4 times per day  . sodium chloride  3 mL Intravenous Q12H   Continuous Infusions: . dextrose 50 mL/hr at 10/02/13 1245    Principal Problem:   Intractable nausea and vomiting Active Problems:   IDDM (insulin dependent diabetes mellitus)   HTN (hypertension)   Diabetic gastroparesis   Diabetic neuropathy   Unspecified constipation    Time spent: 35 min   [name], MD [facility] (P) [phone]

## 2013-10-04 LAB — CBC WITH DIFFERENTIAL/PLATELET
BASOS PCT: 0 % (ref 0–1)
Basophils Absolute: 0 10*3/uL (ref 0.0–0.1)
Eosinophils Absolute: 0.2 10*3/uL (ref 0.0–0.7)
Eosinophils Relative: 2 % (ref 0–5)
HEMATOCRIT: 40.4 % (ref 39.0–52.0)
HEMOGLOBIN: 13.6 g/dL (ref 13.0–17.0)
LYMPHS ABS: 1.9 10*3/uL (ref 0.7–4.0)
Lymphocytes Relative: 21 % (ref 12–46)
MCH: 26.2 pg (ref 26.0–34.0)
MCHC: 33.7 g/dL (ref 30.0–36.0)
MCV: 77.8 fL — ABNORMAL LOW (ref 78.0–100.0)
MONO ABS: 0.9 10*3/uL (ref 0.1–1.0)
Monocytes Relative: 10 % (ref 3–12)
NEUTROS ABS: 6.2 10*3/uL (ref 1.7–7.7)
Neutrophils Relative %: 67 % (ref 43–77)
Platelets: 273 10*3/uL (ref 150–400)
RBC: 5.19 MIL/uL (ref 4.22–5.81)
RDW: 12.8 % (ref 11.5–15.5)
WBC: 9.1 10*3/uL (ref 4.0–10.5)

## 2013-10-04 LAB — GLUCOSE, CAPILLARY
GLUCOSE-CAPILLARY: 178 mg/dL — AB (ref 70–99)
Glucose-Capillary: 173 mg/dL — ABNORMAL HIGH (ref 70–99)
Glucose-Capillary: 190 mg/dL — ABNORMAL HIGH (ref 70–99)
Glucose-Capillary: 193 mg/dL — ABNORMAL HIGH (ref 70–99)
Glucose-Capillary: 237 mg/dL — ABNORMAL HIGH (ref 70–99)

## 2013-10-04 LAB — COMPREHENSIVE METABOLIC PANEL
ALBUMIN: 3.6 g/dL (ref 3.5–5.2)
ALK PHOS: 84 U/L (ref 39–117)
ALT: 8 U/L (ref 0–53)
ANION GAP: 14 (ref 5–15)
AST: 8 U/L (ref 0–37)
BUN: 7 mg/dL (ref 6–23)
CO2: 30 mEq/L (ref 19–32)
CREATININE: 1.09 mg/dL (ref 0.50–1.35)
Calcium: 9.4 mg/dL (ref 8.4–10.5)
Chloride: 95 mEq/L — ABNORMAL LOW (ref 96–112)
GFR calc non Af Amer: 85 mL/min — ABNORMAL LOW (ref >90)
Glucose, Bld: 214 mg/dL — ABNORMAL HIGH (ref 70–99)
POTASSIUM: 3.3 meq/L — AB (ref 3.7–5.3)
Sodium: 139 mEq/L (ref 137–147)
TOTAL PROTEIN: 7.6 g/dL (ref 6.0–8.3)
Total Bilirubin: 0.5 mg/dL (ref 0.3–1.2)

## 2013-10-04 MED ORDER — SODIUM CHLORIDE 0.9 % IV SOLN
INTRAVENOUS | Status: DC
  Administered 2013-10-04 – 2013-10-06 (×2): via INTRAVENOUS

## 2013-10-04 MED ORDER — ENOXAPARIN SODIUM 40 MG/0.4ML ~~LOC~~ SOLN
40.0000 mg | SUBCUTANEOUS | Status: DC
  Administered 2013-10-04 – 2013-10-05 (×2): 40 mg via SUBCUTANEOUS
  Filled 2013-10-04 (×3): qty 0.4

## 2013-10-04 NOTE — Progress Notes (Signed)
[facility] PROGRESS NOTE  Duane [name] [id number] DOB: 1975/06/22 DOA: 09/21/2013 PCP: [name], MD   36 ?, h/o IDDM C Neuropathy/gastropresis-recent admission 7/16-7/18 for same issue and multiple admissions this year-nl flex sig 4/30, UGI mid sized hiatal hernia admitted 09/22/13 with N/V, found to have gastroparesis and maxd out on all therpaies.  Recommneded Tx to [facility] for further management.  Assessment/Plan:  #1 nausea /vomiting/ abdominal pain/gastroparesis -Likely secondary to refractory gastroparesis.  -persistent symptoms despite maximal medical therapy -on IV reglan, erythromycin, Dilaudid -encouraged minimizing narcotics -GI following, keep on clears given emesis this am -Considering Tx to [facility] if symptoms unchanged  #2 constipation -received SMOG enema with good results. X-ray showed moderate to severe stool in the colon.   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 -change dextrose to NS  #4 hypertension Stable. Continue IV enalapril and lopressor.  Orthostatic hypotension -could have a component of autonomic neuropathy from longstanding uncontrolled DM -IVF, supportive care  DVt proph: add lovenox  Code Status: Full  Family Communication: Updated patient at bedside. Disposition Plan: Home when medically stable.   Consultants:  GI: Dr. Fuller Plan 09/28/13  Procedures:  Acute abdominal series 09/22/2013  KUB 09/25/2013  Antibiotics:  None  HPI/Subjective: Vomited this am, feels better since, still with intermittent abd pain   Objective: Filed Vitals:   10/04/13 1400  BP: 132/85  Pulse: 96  Temp: 98.3 F (36.8 C)  Resp: 16    Intake/Output Summary (Last 24 hours) at 10/04/13 1531 Last data filed at 10/04/13 0641  Gross per 24 hour  Intake 854.17 ml  Output    600 ml  Net 254.17 ml   Filed  Weights   09/22/13 0425  Weight: 124.6 kg (274 lb 11.1 oz)    Exam:   General:  NAD, AAOx3, no distress  Cardiovascular: RRR  Respiratory: CTAB  Abdomen: Soft, NT nondistended, hypoactive bowel sounds.  Musculoskeletal: No clubbing cyanosis or edema  Data Reviewed: Basic Metabolic Panel:  Recent Labs Lab 09/28/13 0640 09/29/13 0500 09/30/13 0524 10/04/13 0611  NA 139 142 141 139  K 3.6* 3.4* 3.8 3.3*  CL 98 97 99 95*  CO2 22 19 25 30   GLUCOSE 201* 200* 189* 214*  BUN 8 9 11 7   CREATININE 0.87 0.90 1.02 1.09  CALCIUM 8.7 9.1 8.9 9.4   Liver Function Tests:  Recent Labs Lab 09/28/13 0640 10/04/13 0611  AST 8 8  ALT 8 8  ALKPHOS 79 84  BILITOT 0.5 0.5  PROT 6.9 7.6  ALBUMIN 3.5 3.6   No results found for this basename: LIPASE, AMYLASE,  in the last 168 hours No results found for this basename: AMMONIA,  in the last 168 hours CBC:  Recent Labs Lab 09/29/13 0500 10/04/13 0611  WBC 8.7 9.1  NEUTROABS  --  6.2  HGB 14.1 13.6  HCT 40.2 40.4  MCV 77.5* 77.8*  PLT 229 273   Cardiac Enzymes: No results found for this basename: CKTOTAL, CKMB, CKMBINDEX, TROPONINI,  in the last 168 hours BNP (last 3 results) No results found for this basename: PROBNP,  in the last 8760 hours CBG:  Recent Labs Lab 10/03/13 1933 10/03/13 2326 10/04/13 0450 10/04/13 0722 10/04/13 1133  GLUCAP 267* 194* 178* 173* 190*    No results found for this or any previous visit (from the past 240 hour(s)).   Studies: No results found.  Scheduled Meds: . aspirin  EC  81 mg Oral Daily  . docusate sodium  100 mg Oral BID  . erythromycin  500 mg Intravenous 4 times per day  . feeding supplement (RESOURCE BREEZE)  1 Container Oral BID BM  . HYDROcodone-acetaminophen  1 tablet Oral Q4H  . insulin aspart  0-9 Units Subcutaneous 6 times per day  . insulin glargine  10 Units Subcutaneous Daily  . metoCLOPramide (REGLAN) injection  20 mg Intravenous 4 times per day  .  metoprolol  2.5 mg Intravenous 4 times per day  . pantoprazole (PROTONIX) IV  40 mg Intravenous Daily  . polyethylene glycol  17 g Oral BID  . promethazine  12.5 mg Intravenous 4 times per day  . sodium chloride  3 mL Intravenous Q12H  . sucralfate  1 g Oral TID WC & HS   Continuous Infusions: . dextrose 50 mL/hr at 10/03/13 2147    Principal Problem:   Intractable nausea and vomiting Active Problems:   IDDM (insulin dependent diabetes mellitus)   HTN (hypertension)   Diabetic gastroparesis   Diabetic neuropathy   Unspecified constipation    Time spent: 35 min   [name], MD [facility] (P) [phone]

## 2013-10-05 ENCOUNTER — Inpatient Hospital Stay (HOSPITAL_COMMUNITY): Payer: BC Managed Care – PPO

## 2013-10-05 LAB — BASIC METABOLIC PANEL
Anion gap: 13 (ref 5–15)
BUN: 8 mg/dL (ref 6–23)
CALCIUM: 9.1 mg/dL (ref 8.4–10.5)
CO2: 30 mEq/L (ref 19–32)
Chloride: 98 mEq/L (ref 96–112)
Creatinine, Ser: 1.12 mg/dL (ref 0.50–1.35)
GFR calc Af Amer: >90 mL/min (ref >90)
GFR calc non Af Amer: 82 mL/min — ABNORMAL LOW (ref >90)
Glucose, Bld: 159 mg/dL — ABNORMAL HIGH (ref 70–99)
POTASSIUM: 3 meq/L — AB (ref 3.7–5.3)
Sodium: 141 mEq/L (ref 137–147)

## 2013-10-05 LAB — GLUCOSE, CAPILLARY
GLUCOSE-CAPILLARY: 173 mg/dL — AB (ref 70–99)
Glucose-Capillary: 154 mg/dL — ABNORMAL HIGH (ref 70–99)
Glucose-Capillary: 124 mg/dL — ABNORMAL HIGH (ref 70–99)
Glucose-Capillary: 263 mg/dL — ABNORMAL HIGH (ref 70–99)
Glucose-Capillary: 216 mg/dL — ABNORMAL HIGH (ref 70–99)
Glucose-Capillary: 145 mg/dL — ABNORMAL HIGH (ref 70–99)

## 2013-10-05 MED ORDER — CLONIDINE HCL 0.1 MG PO TABS
0.1000 mg | ORAL_TABLET | Freq: Two times a day (BID) | ORAL | Status: DC
  Administered 2013-10-05 – 2013-10-06 (×3): 0.1 mg via ORAL
  Filled 2013-10-05 (×4): qty 1

## 2013-10-05 MED ORDER — SORBITOL 70 % SOLN
960.0000 mL | TOPICAL_OIL | Freq: Once | ORAL | Status: DC
  Filled 2013-10-05: qty 240

## 2013-10-05 MED ORDER — METOPROLOL TARTRATE 50 MG PO TABS
50.0000 mg | ORAL_TABLET | Freq: Two times a day (BID) | ORAL | Status: DC
  Administered 2013-10-05 – 2013-10-06 (×2): 50 mg via ORAL
  Filled 2013-10-05 (×3): qty 1

## 2013-10-05 MED ORDER — POTASSIUM CHLORIDE 20 MEQ PO PACK
40.0000 meq | PACK | Freq: Every day | ORAL | Status: DC
  Filled 2013-10-05: qty 2

## 2013-10-05 MED ORDER — POTASSIUM CHLORIDE CRYS ER 20 MEQ PO TBCR
40.0000 meq | EXTENDED_RELEASE_TABLET | Freq: Every day | ORAL | Status: DC
  Administered 2013-10-05 – 2013-10-06 (×2): 40 meq via ORAL
  Filled 2013-10-05 (×2): qty 2

## 2013-10-05 MED ORDER — HYDROMORPHONE HCL PF 1 MG/ML IJ SOLN: 1.0000 mg | INTRAMUSCULAR | Status: DC | PRN

## 2013-10-05 NOTE — Progress Notes (Signed)
Discussed case with hospitalist who is planning to initiate transfer to [facility] signout this AM and we all agree that is appropriate.  He has DM related gastroparesis with chronic narcotic use.  Most important is that he get off the narcotic meds.

## 2013-10-05 NOTE — Progress Notes (Signed)
Pt refused miralax again. The last dose pt took was 8/21 at noon.   Educated pt on importance of miralax.  Pt states it doesn't work for him.  Pt taking narcotics, educated on narcotics slowing motility.  Fara Olden P

## 2013-10-05 NOTE — Progress Notes (Signed)
[facility] PROGRESS NOTE  Duane [name] [id number] DOB: Apr 16, 1975 DOA: 09/21/2013 PCP: [name], MD   53 ?, h/o IDDM C Neuropathy/gastropresis-recent admission 7/16-7/18 for same issue and multiple admissions this year-nl flex sig 4/30, UGI mid sized hiatal hernia admitted 09/22/13 with N/V, found to have gastroparesis and maxd out on all therpaies.  Recommneded Tx to [facility] for further management.  Assessment/Plan:  #1 nausea /vomiting/ abdominal pain/gastroparesis -Likely secondary to refractory gastroparesis.  -persistent symptoms despite maximal medical therapy -on IV reglan, erythromycin, Dilaudid -encouraged minimizing narcotics-this is been done todayto Dilaudid to every 4 when necessary -GI and I have discussed the case -We will try to gradually his diet once more to a dysphagia 3 diet. If not we will make a decision in the morning regarding transfer to [facility] for specialty care  #2 constipation -received SMOG enema with good results. X-ray showed moderate to severe stool in the colon.  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 Continue IV enalapril and lopressor. -Transitioned to by mouth but above 50 twice a day, clonidine 0.1 twice a day, keep the dressing family every 4 when necessary  Orthostatic hypotension -could have a component of autonomic neuropathy from longstanding uncontrolled DM -IVF, supportive care  Hypokalemia Replace with by mouth potassium 40 mEq packets  DVt proph: add lovenox  Code Status: Full  Family Communication: Updated patient at bedside. Disposition Plan: Home when medically stable-if cannot achieve stability will transfer to [place]   Consultants:  GI: Dr. Fuller Plan 09/28/13  Procedures:  Acute abdominal series 09/22/2013  KUB 09/25/2013  Antibiotics:  None  HPI/Subjective:  Pain better Patient seen to be ambulating in the halls without much distress Tolerating clears and ready to graduate diet   Objective: Filed Vitals:   10/05/13 1316  BP: 139/83  Pulse: 99  Temp: 97.6 F (36.4 C)  Resp: 17    Intake/Output Summary (Last 24 hours) at 10/05/13 1350 Last data filed at 10/05/13 1107  Gross per 24 hour  Intake 2517.25 ml  Output    300 ml  Net 2217.25 ml   Filed Weights   09/22/13 0425  Weight: 124.6 kg (274 lb 11.1 oz)    Exam:   General:  NAD, AAOx3, no distress  Cardiovascular: RRR  Respiratory: CTAB  Abdomen: Soft, NT nondistended-even on deep palpation no pain  Musculoskeletal: No clubbing cyanosis or edema  Data Reviewed: Basic Metabolic Panel:  Recent Labs Lab 09/29/13 0500 09/30/13 0524 10/04/13 0611 10/05/13 0525  NA 142 141 139 141  K 3.4* 3.8 3.3* 3.0*  CL 97 99 95* 98  CO2 19 25 30 30   GLUCOSE 200* 189* 214* 159*  BUN 9 11 7 8   CREATININE 0.90 1.02 1.09 1.12  CALCIUM 9.1 8.9 9.4 9.1   Liver Function Tests:  Recent Labs Lab 10/04/13 0611  AST 8  ALT 8  ALKPHOS 84  BILITOT 0.5  PROT 7.6  ALBUMIN 3.6   No results found for this basename: LIPASE, AMYLASE,  in the last 168 hours No results found for this basename: AMMONIA,  in the last 168 hours CBC:  Recent Labs Lab 09/29/13 0500 10/04/13 0611  WBC 8.7 9.1  NEUTROABS  --  6.2  HGB 14.1 13.6  HCT 40.2 40.4  MCV  77.5* 77.8*  PLT 229 273   Cardiac Enzymes: No results found for this basename: CKTOTAL, CKMB, CKMBINDEX, TROPONINI,  in the last 168 hours BNP (last 3 results) No results found for this basename: PROBNP,  in the last 8760 hours CBG:  Recent Labs Lab 10/04/13 1944 10/04/13 2343 10/05/13 0330 10/05/13 0724 10/05/13 1119  GLUCAP 237* 173* 154* 124* 263*    No  results found for this or any previous visit (from the past 240 hour(s)).   Studies: Dg Abd 1 View  10/05/2013   CLINICAL DATA:  Epigastric pain, constipation.  EXAM: ABDOMEN - 1 VIEW  COMPARISON:  September 25, 2013.  FINDINGS: The bowel gas pattern is normal. Status post cholecystectomy. Mild amount of stool burden is noted which is slightly decreased compared to prior exam. No radio-opaque calculi or other significant radiographic abnormality are seen.  IMPRESSION: Decreased amount of stool burden seen within the colon. No evidence of bowel obstruction or ileus is noted.   Electronically Signed   By: Sabino Dick M.D.   On: 10/05/2013 10:26    Scheduled Meds: . aspirin EC  81 mg Oral Daily  . docusate sodium  100 mg Oral BID  . enoxaparin (LOVENOX) injection  40 mg Subcutaneous Q24H  . erythromycin  500 mg Intravenous 4 times per day  . feeding supplement (RESOURCE BREEZE)  1 Container Oral BID BM  . HYDROcodone-acetaminophen  1 tablet Oral Q4H  . insulin aspart  0-9 Units Subcutaneous 6 times per day  . insulin glargine  10 Units Subcutaneous Daily  . metoCLOPramide (REGLAN) injection  20 mg Intravenous 4 times per day  . metoprolol  2.5 mg Intravenous 4 times per day  . pantoprazole (PROTONIX) IV  40 mg Intravenous Daily  . polyethylene glycol  17 g Oral BID  . promethazine  12.5 mg Intravenous 4 times per day  . sodium chloride  3 mL Intravenous Q12H  . sorbitol, milk of mag, mineral oil, glycerin (SMOG) enema  960 mL Rectal Once  . sucralfate  1 g Oral TID WC & HS   Continuous Infusions: . sodium chloride 75 mL/hr at 10/04/13 1639    Principal Problem:   Intractable nausea and vomiting Active Problems:   IDDM (insulin dependent diabetes mellitus)   HTN (hypertension)   Diabetic gastroparesis   Diabetic neuropathy   Unspecified constipation    Time spent: 15 min   [name], MD [facility] (P) [phone]

## 2013-10-06 ENCOUNTER — Encounter: Payer: Self-pay | Admitting: Family Medicine

## 2013-10-06 LAB — GLUCOSE, CAPILLARY
GLUCOSE-CAPILLARY: 141 mg/dL — AB (ref 70–99)
GLUCOSE-CAPILLARY: 216 mg/dL — AB (ref 70–99)
Glucose-Capillary: 177 mg/dL — ABNORMAL HIGH (ref 70–99)
Glucose-Capillary: 184 mg/dL — ABNORMAL HIGH (ref 70–99)
Glucose-Capillary: 186 mg/dL — ABNORMAL HIGH (ref 70–99)
Glucose-Capillary: 190 mg/dL — ABNORMAL HIGH (ref 70–99)

## 2013-10-06 MED ORDER — POLYETHYLENE GLYCOL 3350 17 G PO PACK: 17.0000 g | PACK | Freq: Two times a day (BID) | ORAL | Status: DC

## 2013-10-06 MED ORDER — HYDROCODONE-ACETAMINOPHEN 5-325 MG PO TABS: 1.0000 | ORAL_TABLET | Freq: Three times a day (TID) | ORAL | Status: DC | PRN

## 2013-10-06 MED ORDER — INSULIN GLARGINE 100 UNIT/ML ~~LOC~~ SOLN: 10.0000 [IU] | Freq: Every day | SUBCUTANEOUS | Status: DC

## 2013-10-06 MED ORDER — ERYTHROMYCIN ETHYLSUCCINATE 200 MG/5ML PO SUSR: 400.0000 mg | Freq: Three times a day (TID) | ORAL | Status: DC

## 2013-10-06 MED ORDER — ERYTHROMYCIN ETHYLSUCCINATE 200 MG/5ML PO SUSR
400.0000 mg | Freq: Three times a day (TID) | ORAL | Status: DC
  Administered 2013-10-06: 400 mg via ORAL
  Filled 2013-10-06 (×2): qty 5
  Filled 2013-10-06 (×2): qty 10
  Filled 2013-10-06: qty 5

## 2013-10-06 MED ORDER — PROMETHAZINE HCL 25 MG RE SUPP: 25.0000 mg | Freq: Four times a day (QID) | RECTAL | Status: DC | PRN

## 2013-10-06 NOTE — Progress Notes (Signed)
NUTRITION FOLLOW UP  Intervention:   - Encouraged pt to continue to eat well and follow gastroparesis diet - Continue Resource Breeze BID - RD to continue to monitor   Nutrition Dx:   Inadequate oral intake related to nausea/vomiting as evidenced by RN report - resolved per pt   Goal:   Diet advancement - met   New goal: 1. Pt to consume >90% of meals/supplements 2. No nausea/vomiting reoccurrence    Monitor:   Weights, labs, intake, nausea/vomiting   Assessment:   Pt with chronic diabetic gastroparesis.He states that this time he started having intractable nausea and vomiting on 8/10. He came to the [facility] ED and was here until 0600 receiving IV fluids and antiemetics. It seemed that his vomiting had stopped and he was discharged to home. He states that the vomiting started again not long after he got home. He complains of severe hiccoughs and abdominal pain 8/11. GI and diabetic coordinator following.   8/18: -Pt discussed during multidisciplinary rounds  -Had retching this afternoon  -Didn't try any liquids yesterday or today  -Met with pt who didn't talk long as he wanted to rest but reports he was eating 2 meals/day at home - admits that half of his foods at home were greasy  -Weight down 11 pounds in the past month   8/25: -Educated pt on diet therapy for gastroparesis 8/21 -Diet advanced to dysphagia 3 yesterday  -Met with pt who reports since diet advancement to solid foods he has been eating 100% of meals and drinking between 1-2 Resource Breeze day -Said he has been selecting bland foods and knows to eat small frequent meals -Denies any nausea/vomiting today -Said he ate all of his breakfast and is hoping it stays down -Reports he thinks he "gets into trouble" at home because he works during the night and sleeps during the day and its hard to train his body to snack during the night -Encouraged pt to start a schedule or get a reminder on his phone to get into  the habit of consuming small frequent bland meals    Height: Ht Readings from Last 1 Encounters:  09/22/13 _0  (1.93 m)    Weight Status:   Wt Readings from Last 1 Encounters:  09/22/13 274 lb 11.1 oz (124.6 kg)    Re-estimated needs:  Kcal: 2300-2500  Protein: 115-130g  Fluid: per MD   Skin: Non-pitting RUE edema, +1 LUE edema  Diet Order: Dysphagia 3, thin   Intake/Output Summary (Last 24 hours) at 10/06/13 1216 Last data filed at 10/06/13 4098  Gross per 24 hour  Intake 2513.5 ml  Output    200 ml  Net 2313.5 ml    Last BM: 8/19   Labs:   Recent Labs Lab 09/30/13 0524 10/04/13 0611 10/05/13 0525  NA 141 139 141  K 3.8 3.3* 3.0*  CL 99 95* 98  CO2 _1 BUN _2 CREATININE 1.02 1.09 1.12  CALCIUM 8.9 9.4 9.1  GLUCOSE 189* 214* 159*    CBG (last 3)   Recent Labs  10/06/13 0400 10/06/13 0716 10/06/13 1113  GLUCAP 177* 141* 216*    Scheduled Meds: . aspirin EC  81 mg Oral Daily  . cloNIDine  0.1 mg Oral BID  . docusate sodium  100 mg Oral BID  . enoxaparin (LOVENOX) injection  40 mg Subcutaneous Q24H  . erythromycin ethylsuccinate  400 mg Oral 3 times per day  . feeding supplement (  RESOURCE BREEZE)  1 Container Oral BID BM  . insulin aspart  0-9 Units Subcutaneous 6 times per day  . insulin glargine  10 Units Subcutaneous Daily  . metoCLOPramide (REGLAN) injection  20 mg Intravenous 4 times per day  . metoprolol  50 mg Oral BID  . pantoprazole (PROTONIX) IV  40 mg Intravenous Daily  . polyethylene glycol  17 g Oral BID  . potassium chloride  40 mEq Oral Daily  . promethazine  12.5 mg Intravenous 4 times per day  . sodium chloride  3 mL Intravenous Q12H  . sorbitol, milk of mag, mineral oil, glycerin (SMOG) enema  960 mL Rectal Once  . sucralfate  1 g Oral TID WC & HS    Continuous Infusions: . sodium chloride 75 mL/hr at 10/06/13 [place] MS, RD, LDN [phone] Pager [phone] Weekend/After Hours Pager

## 2013-10-06 NOTE — Discharge Summary (Signed)
Physician Discharge Summary  Duane [name] [id number] DOB: 18-Feb-1975 DOA: 09/21/2013  PCP: [name], MD  Admit date: 09/21/2013 Discharge date: 10/06/2013  Time spent: 35 minutes  Recommendations for Outpatient Follow-up:  1. Recommend Chem-12, CBC in about a week 2. Recommend checking A1c in 3 months 3. Recommend ambulatory referral to [facility] for gastroparesis, severe  Discharge Diagnoses:  Principal Problem:   Intractable nausea and vomiting Active Problems:   IDDM (insulin dependent diabetes mellitus)   HTN (hypertension)   Diabetic gastroparesis   Diabetic neuropathy   Unspecified constipation   Discharge Condition: Fair  Diet recommendation: Small meals  Filed Weights   09/22/13 0425  Weight: 124.6 kg (274 lb 11.1 oz)    History of present illness:  58 ?, h/o IDDM C Neuropathy/gastropresis-recent admission 7/16-7/18 for same issue and multiple admissions this year-nl flex sig 4/30, UGI mid sized hiatal hernia admitted 09/22/13 with N/V, found to have gastroparesis and maxd out on all therpaies. Recommneded Tx to [facility] for further management, however this resolved over the course of hospital stay and he did not need this   Hospital Course:  #1 nausea /vomiting/ abdominal pain/gastroparesis  -Likely secondary to refractory gastroparesis.  -persistent symptoms despite maximal medical therapy  -Was initially on on IV reglan, erythromycin, Dilaudid  -We weaned her back his IV pain medicine and he was discharged home on by mouth narcotics -On day of discharge he was tolerating a full diet and had been graduated up to this -Have recommended to him that if this recurs he should probably present himself to [facility] where they have further resources #2 constipation  -received SMOG enema with good results. X-ray showed moderate to severe stool in the colon.  -Repeat abdominal x-ray 8/24 shows no residual stool were  #3 insulin-dependent diabetes  mellitus with neurological complications/diabetic neuropathy  -Hemoglobin A1c was 9.3 09/21/2013  -stable, Continue SSI/ Lantus 10 units daily  -Blood sugars ranging from 124-263  -change dextrose to NS  #4 hypertension  Stable. Continue IV enalapril and lopressor.  -Transitioned to metoprolol 50 twice a day, clonidine 0.1 twice a day as well as amlodipine home dose on discharge Orthostatic hypotension  -could have a component of autonomic neuropathy from longstanding uncontrolled DM  -IVF, supportive care -This resolved on discharge   Consultations:  Gastroenterology  Discharge Exam: Filed Vitals:   10/06/13 0602  BP: 123/79  Pulse: 76  Temp: 97.6 F (36.4 C)  Resp: 18   Looks well feels much better  General: EOMI NCAT Cardiovascular: S1-S2 no murmur rub or gallop Respiratory: Clinically clear Soft nontender nondistended abdomen  Discharge Instructions You were cared for by a hospitalist during your hospital stay. If you have any questions about your discharge medications or the care you received while you were in the hospital after you are discharged, you can call the unit and asked to speak with the hospitalist on call if the hospitalist that took care of you is not available. Once you are discharged, your primary care physician will handle any further medical issues. Please note that NO REFILLS for any discharge medications will be authorized once you are discharged, as it is imperative that you return to your primary care physician (or establish a relationship with a primary care physician if you do not have one) for your aftercare needs so that they can reassess your need for medications and monitor your lab values.  Discharge Instructions   Diet - low sodium heart healthy    Complete  by:  As directed      Discharge instructions    Complete by:  As directed   We will discharge her with Reglan, erythromycin, Protonix, Carafate and enough Phenergan both oral and rectal so  that you can manage this disease at home  I would recommend if you start having issues once again that Q. be referred to a tertiary care center such as [facility] where they have more options for this type of illness Recommend followup with primary care physician in about a week     Increase activity slowly    Complete by:  As directed             Medication List         acetaminophen 500 MG tablet  Commonly known as:  TYLENOL  Take 500 mg by mouth every 6 (six) hours as needed for mild pain.     amLODipine 10 MG tablet  Commonly known as:  NORVASC  Take 1 tablet (10 mg total) by mouth every morning.     cloNIDine 0.1 MG tablet  Commonly known as:  CATAPRES  Take 1 tablet (0.1 mg total) by mouth 2 (two) times daily.     erythromycin ethylsuccinate 200 MG/5ML suspension  Commonly known as:  EES  Take 10 mLs (400 mg total) by mouth every 8 (eight) hours.     gabapentin 300 MG capsule  Commonly known as:  NEURONTIN  Take 1 capsule (300 mg total) by mouth at bedtime.     HYDROcodone-acetaminophen 5-325 MG per tablet  Commonly known as:  NORCO/VICODIN  Take 1 tablet by mouth every 8 (eight) hours as needed for moderate pain.     insulin aspart 100 UNIT/ML injection  Commonly known as:  novoLOG  Inject 9-12 Units into the skin 3 (three) times daily before meals. SSI     insulin glargine 100 UNIT/ML injection  Commonly known as:  LANTUS  Inject 0.1 mLs (10 Units total) into the skin at bedtime.     metoCLOPramide 10 MG tablet  Commonly known as:  REGLAN  Take 10 mg by mouth 4 (four) times daily.     metoprolol 50 MG tablet  Commonly known as:  LOPRESSOR  Take 50 mg by mouth 2 (two) times daily.     pantoprazole 40 MG tablet  Commonly known as:  PROTONIX  Take 1 tablet (40 mg total) by mouth every morning.     polyethylene glycol packet  Commonly known as:  MIRALAX / GLYCOLAX  Take 17 g by mouth 2 (two) times daily.     promethazine 25 MG tablet  Commonly  known as:  PHENERGAN  Take 1 tablet (25 mg total) by mouth every 8 (eight) hours as needed for nausea or vomiting.     promethazine 25 MG suppository  Commonly known as:  PHENERGAN  Place 1 suppository (25 mg total) rectally every 6 (six) hours as needed for nausea or vomiting.     sucralfate 1 G tablet  Commonly known as:  CARAFATE  Take 1 g by mouth 4 (four) times daily -  before meals and at bedtime.     traMADol 50 MG tablet  Commonly known as:  ULTRAM  Take 50 mg by mouth every 6 (six) hours as needed for moderate pain.       No Known Allergies    The results of significant diagnostics from this hospitalization (including imaging, microbiology, ancillary and laboratory) are listed below for reference.  Significant Diagnostic Studies: Dg Abd 1 View  10/05/2013   CLINICAL DATA:  Epigastric pain, constipation.  EXAM: ABDOMEN - 1 VIEW  COMPARISON:  September 25, 2013.  FINDINGS: The bowel gas pattern is normal. Status post cholecystectomy. Mild amount of stool burden is noted which is slightly decreased compared to prior exam. No radio-opaque calculi or other significant radiographic abnormality are seen.  IMPRESSION: Decreased amount of stool burden seen within the colon. No evidence of bowel obstruction or ileus is noted.   Electronically Signed   By: Sabino Dick M.D.   On: 10/05/2013 10:26   Dg Abd 2 Views  09/25/2013   CLINICAL DATA:  Nausea and vomiting  EXAM: ABDOMEN - 2 VIEW  COMPARISON:  09/22/2013  FINDINGS: Scattered large and small bowel gas is noted. Fecal material is noted throughout the colon without definitive obstructive change. No abnormal mass or abnormal calcifications are seen. No free air is noted.  IMPRESSION: No acute abnormality seen. A moderate to severe stool burden is noted but stable.   Electronically Signed   By: Inez Catalina M.D.   On: 09/25/2013 14:35   Dg Abd Acute W/chest  09/22/2013   CLINICAL DATA:  Nausea, vomiting and upper abdominal pain.  EXAM:  ACUTE ABDOMEN SERIES (ABDOMEN 2 VIEW & CHEST 1 VIEW)  COMPARISON:  Chest and abdominal radiographs performed 08/17/2013  FINDINGS: The lungs are well-aerated and clear. There is no evidence of focal opacification, pleural effusion or pneumothorax. The cardiomediastinal silhouette is within normal limits.  The visualized bowel gas pattern is unremarkable. Scattered stool and air are seen within the colon; there is no evidence of small bowel dilatation to suggest obstruction. No free intra-abdominal air is identified on the provided upright view. Clips are noted within the right upper quadrant, reflecting prior cholecystectomy.  No acute osseous abnormalities are seen; the sacroiliac joints are unremarkable in appearance.  IMPRESSION: 1. Unremarkable bowel gas pattern; no free intra-abdominal air seen. Moderate amount of stool noted in the colon. 2. No acute cardiopulmonary process identified.   Electronically Signed   By: Garald Balding M.D.   On: 09/22/2013 01:22    Microbiology: No results found for this or any previous visit (from the past 240 hour(s)).   Labs: Basic Metabolic Panel:  Recent Labs Lab 09/30/13 0524 10/04/13 0611 10/05/13 0525  NA 141 139 141  K 3.8 3.3* 3.0*  CL 99 95* 98  CO2 25 30 30   GLUCOSE 189* 214* 159*  BUN 11 7 8   CREATININE 1.02 1.09 1.12  CALCIUM 8.9 9.4 9.1   Liver Function Tests:  Recent Labs Lab 10/04/13 0611  AST 8  ALT 8  ALKPHOS 84  BILITOT 0.5  PROT 7.6  ALBUMIN 3.6   No results found for this basename: LIPASE, AMYLASE,  in the last 168 hours No results found for this basename: AMMONIA,  in the last 168 hours CBC:  Recent Labs Lab 10/04/13 0611  WBC 9.1  NEUTROABS 6.2  HGB 13.6  HCT 40.4  MCV 77.8*  PLT 273   Cardiac Enzymes: No results found for this basename: CKTOTAL, CKMB, CKMBINDEX, TROPONINI,  in the last 168 hours BNP: BNP (last 3 results) No results found for this basename: PROBNP,  in the last 8760 hours CBG:  Recent  Labs Lab 10/05/13 2356 10/06/13 0010 10/06/13 0400 10/06/13 0716 10/06/13 1113  GLUCAP 190* 186* 177* 141* 216*       Signed:  Robecca Fulgham, [place]  [facility] 10/06/2013, 1:25 PM

## 2013-10-08 ENCOUNTER — Encounter: Payer: Self-pay | Admitting: Endocrinology

## 2013-10-08 ENCOUNTER — Ambulatory Visit (INDEPENDENT_AMBULATORY_CARE_PROVIDER_SITE_OTHER): Payer: BC Managed Care – PPO | Admitting: Endocrinology

## 2013-10-08 ENCOUNTER — Other Ambulatory Visit: Payer: Self-pay | Admitting: *Deleted

## 2013-10-08 VITALS — BP 118/68 | HR 88 | Temp 98.0°F | Resp 16 | Ht 76.0 in | Wt 272.8 lb

## 2013-10-08 DIAGNOSIS — IMO0002 Reserved for concepts with insufficient information to code with codable children: Secondary | ICD-10-CM

## 2013-10-08 DIAGNOSIS — E108 Type 1 diabetes mellitus with unspecified complications: Principal | ICD-10-CM

## 2013-10-08 DIAGNOSIS — E1149 Type 2 diabetes mellitus with other diabetic neurological complication: Secondary | ICD-10-CM

## 2013-10-08 DIAGNOSIS — K3184 Gastroparesis: Secondary | ICD-10-CM

## 2013-10-08 DIAGNOSIS — E1143 Type 2 diabetes mellitus with diabetic autonomic (poly)neuropathy: Secondary | ICD-10-CM

## 2013-10-08 DIAGNOSIS — E1065 Type 1 diabetes mellitus with hyperglycemia: Secondary | ICD-10-CM

## 2013-10-08 MED ORDER — ONETOUCH DELICA LANCETS FINE MISC: Status: DC

## 2013-10-08 MED ORDER — GLUCOSE BLOOD VI STRP: ORAL_STRIP | Status: DC

## 2013-10-08 NOTE — Progress Notes (Signed)
Patient ID: Duane [name], male   DOB: March 29, 1975, 38 y.o.   MRN: [id number]          Reason for Appointment : Followup for Type 1 Diabetes  History of Present Illness          Diagnosis: Type 1 diabetes mellitus, date of diagnosis: 04/2003         Previous history:  He was initially diagnosed with significant symptomatology and may do hospital with a glucose of about 1100 Initially sent home on insulin and he thinks he was able to get off insulin for about a year after this Subsequently has been back on insulin and he has been on NovoLog with his Lantus for the last 2 years or so Previous records are not available as he was under medical care in [place] Not clear what level of control he has had  INSULIN regimen is described as: Lantus 32 at noon, NovoLog with meals, 3-9 units  Recent history: Overall he has had poor diabetes control partly because of gastroparesis and has had recurrent hospitalizations A1c this year has been consistently over 9% On his initial consultation he was advised to take consistent dose of Lantus and titrate to get morning sugars under control. Also was advised on mealtime coverage the NovoLog based on carbohydrate counting and also correction dose However he has not followed any other directions and does not understand how to use his NovoLog Also he had been admitted to the hospital for 2 weeks until about 2 days ago for gastroparesis again Apparently in the hospital his blood sugars were relatively good with lower doses of insulin Since he has been home his blood sugars have been consistently over 200   Again he is taking variable doses of Lantus based on his blood sugar at his bedtime Also he is adjusting his NovoLog based on a sliding scale correction dose only He thinks that if he will eat a sandwich he may need 9 units to cover the meal and high sugar but does not have any consistent regimen, may take between 3-9 units at mealtimes He is working usually  night shifts, either midnight until 8 AM or sometimes 12 hours    Glucose monitoring:  done times a day         Glucometer: One Touch Ultra.      Blood Glucose readings the last 2 days range from 222-262, check at about 8 AM and 3 PM Previously blood sugars would be slightly higher without any consistent trend      Hypoglycemia: none        Self-care: The diet that the patient has been following is: None  Meals: 2-3 meals per day. Eating and  8 pm; 2 am occasionally and at 8 am     Breakfast is taking eggs or cereal, breakfastsandwiches. At dinner he will have a starch with meat or vegetables     Exercise: 3 days a week , aerobic and weightlifting for up to one hour in the afternoon         Dietician consultation: Most recent: Years ago.         CDE consultation: None Retinal exam: Most recent: years ago    Diabetes labs:   Lab Results  Component Value Date   HGBA1C 9.3* 09/21/2013   HGBA1C 9.2* 08/27/2013   HGBA1C 9.0* 05/31/2013   Lab Results  Component Value Date   CREATININE 1.12 10/05/2013      Medication List  This list is accurate as of: 10/08/13 11:59 PM.  Always use your most recent med list.               acetaminophen 500 MG tablet  Commonly known as:  TYLENOL  Take 500 mg by mouth every 6 (six) hours as needed for mild pain.     amLODipine 10 MG tablet  Commonly known as:  NORVASC  Take 1 tablet (10 mg total) by mouth every morning.     cloNIDine 0.1 MG tablet  Commonly known as:  CATAPRES  Take 1 tablet (0.1 mg total) by mouth 2 (two) times daily.     erythromycin ethylsuccinate 200 MG/5ML suspension  Commonly known as:  EES  Take 10 mLs (400 mg total) by mouth every 8 (eight) hours.     gabapentin 300 MG capsule  Commonly known as:  NEURONTIN  Take 1 capsule (300 mg total) by mouth at bedtime.     glucose blood test strip  Commonly known as:  ONE TOUCH ULTRA TEST  Use as instructed to check blood sugar once a day dx code 250.00      HYDROcodone-acetaminophen 5-325 MG per tablet  Commonly known as:  NORCO/VICODIN  Take 1 tablet by mouth every 8 (eight) hours as needed for moderate pain.     insulin aspart 100 UNIT/ML injection  Commonly known as:  novoLOG  Inject 9-12 Units into the skin 3 (three) times daily before meals. SSI     insulin glargine 100 UNIT/ML injection  Commonly known as:  LANTUS  Inject 0.1 mLs (10 Units total) into the skin at bedtime.     metoCLOPramide 10 MG tablet  Commonly known as:  REGLAN  Take 10 mg by mouth 4 (four) times daily.     metoprolol 50 MG tablet  Commonly known as:  LOPRESSOR  Take 50 mg by mouth 2 (two) times daily.     ONETOUCH DELICA LANCETS FINE Misc  Use one per day dx code 250.00     pantoprazole 40 MG tablet  Commonly known as:  PROTONIX  Take 1 tablet (40 mg total) by mouth every morning.     polyethylene glycol packet  Commonly known as:  MIRALAX / GLYCOLAX  Take 17 g by mouth 2 (two) times daily.     promethazine 25 MG tablet  Commonly known as:  PHENERGAN  Take 1 tablet (25 mg total) by mouth every 8 (eight) hours as needed for nausea or vomiting.     promethazine 25 MG suppository  Commonly known as:  PHENERGAN  Place 1 suppository (25 mg total) rectally every 6 (six) hours as needed for nausea or vomiting.     sucralfate 1 G tablet  Commonly known as:  CARAFATE  Take 1 g by mouth 4 (four) times daily -  before meals and at bedtime.     traMADol 50 MG tablet  Commonly known as:  ULTRAM  Take 50 mg by mouth every 6 (six) hours as needed for moderate pain.        Allergies: No Known Allergies  Past Medical History  Diagnosis Date  . Hypertension   . Diabetes mellitus without complication age 47    insulin requiring from the start  . Gastritis   . Gastroparesis 2012    100% gastric retention on 08/2013 [facility]  . Obesity   . Diabetic neuropathy     Past Surgical History  Procedure Laterality Date  . Cholecystectomy  2013  .  Knee surgery  Left   . Flexible sigmoidoscopy N/A 06/11/2013    Procedure: FLEXIBLE SIGMOIDOSCOPY;  Surgeon: [name], MD;  Location: [facility];  Service: Endoscopy;  Laterality: N/A;  . Esophagogastroduodenoscopy N/A 06/11/2013    Procedure: ESOPHAGOGASTRODUODENOSCOPY (EGD);  Surgeon: [name], MD;  Location: [facility];  Service: Endoscopy;  Laterality: N/A;    Family History  Problem Relation Age of Onset  . Diabetes Father     Social History:  reports that he has never smoked. He has never used smokeless tobacco. He reports that he drinks alcohol. He reports that he does not use illicit drugs.    Review of Systems       Lipids: Unknown  No results found for this basename: CHOL,  HDL,  LDLCALC,  LDLDIRECT,  TRIG,  CHOLHDL   He has not seen the gastroenterologist for his gastroparesis as yet        Has  history of Numbness, tingling or burning in feet . Will have some shooting pains in his feet and legs, mostly on the right side  Vibration sense is mildly reduced in toes. Ankle jerks are absent bilaterally.          Diabetic foot exam: Mild decrease in monofilament sensation distally in the toes  Diabetic Complications: Gastroparesis and diaphoretic episodes related to autonomic neuropathy, some evidence of peripheral neuropathy Not clear if he has retinopathy or nephropathy as he has not had any recent eye exams are microalbumin level  LABS:  Lab Results  Component Value Date   CREATININE 1.12 10/05/2013     Physical Examination:  BP 118/68  Pulse 88  Temp(Src) 98 F (36.7 C)  Resp 16  Ht 6\' 4"  (1.93 m)  Wt 272 lb 12.8 oz (123.741 kg)  BMI 33.22 kg/m2  SpO2 97%     ASSESSMENT/  PLAN:   Diabetes type 1, long-standing He  has had  persistent poor control with inadequate insulin  Problems identified and recommendations: Has poor understanding of insulin management for both basal and bolus insulin doses His last visit he was told to take NovoLog before eating  and have discussed in detail again the dosage to be a combination of carbohydrate coverage and correction doses with each meal Given a booklet on carbohydrate counting again Also needs to be on a low-fat diet to improve control Have given him flowsheet to adjust his Lantus. Since he is having high readings with 32 units of Lantus will increase to 36 for now Needs consultation with nurse educator  Patient Instructions  Lantus 36 units on waking up and adjust as directed   Novolog 3 units per serving of 15 g carbs: Site called Cal[facility]  High sugar over 150:  1 unit per 30 points as follows 150=1  180=2 210=3 240=4 270=5     Counseling time over 50% of today's 25 minute visit  Dawnell Bryant 10/09/2013, 8:32 AM   Note: This note was prepared with Dragon voice recognition system technology. Any transcriptional errors that result from this process are unintentional.

## 2013-10-08 NOTE — Patient Instructions (Addendum)
Lantus 36 units on waking up and adjust as directed   Novolog 3 units per serving of 15 g carbs: Site called Calorie [facility]  High sugar over 150:  1 unit per 30 points as follows 150=1  180=2 210=3 240=4 270=5

## 2013-10-14 ENCOUNTER — Inpatient Hospital Stay (HOSPITAL_COMMUNITY)
Admission: EM | Admit: 2013-10-14 | Discharge: 2013-10-19 | DRG: 074 | Disposition: A | Discharge status: Other Acute Inpatient Hospital | Payer: BC Managed Care – PPO | Attending: Internal Medicine | Admitting: Internal Medicine

## 2013-10-14 ENCOUNTER — Encounter (HOSPITAL_COMMUNITY): Payer: Self-pay | Admitting: Emergency Medicine

## 2013-10-14 DIAGNOSIS — I10 Essential (primary) hypertension: Secondary | ICD-10-CM

## 2013-10-14 DIAGNOSIS — R112 Nausea with vomiting, unspecified: Secondary | ICD-10-CM | POA: Diagnosis present

## 2013-10-14 DIAGNOSIS — Z794 Long term (current) use of insulin: Secondary | ICD-10-CM

## 2013-10-14 DIAGNOSIS — K3184 Gastroparesis: Secondary | ICD-10-CM

## 2013-10-14 DIAGNOSIS — E1143 Type 2 diabetes mellitus with diabetic autonomic (poly)neuropathy: Secondary | ICD-10-CM

## 2013-10-14 DIAGNOSIS — K117 Disturbances of salivary secretion: Secondary | ICD-10-CM | POA: Diagnosis present

## 2013-10-14 DIAGNOSIS — E119 Type 2 diabetes mellitus without complications: Secondary | ICD-10-CM

## 2013-10-14 DIAGNOSIS — E1049 Type 1 diabetes mellitus with other diabetic neurological complication: Secondary | ICD-10-CM

## 2013-10-14 DIAGNOSIS — E860 Dehydration: Secondary | ICD-10-CM | POA: Diagnosis present

## 2013-10-14 DIAGNOSIS — R1115 Cyclical vomiting syndrome unrelated to migraine: Secondary | ICD-10-CM

## 2013-10-14 DIAGNOSIS — E1142 Type 2 diabetes mellitus with diabetic polyneuropathy: Secondary | ICD-10-CM | POA: Diagnosis present

## 2013-10-14 DIAGNOSIS — N179 Acute kidney failure, unspecified: Secondary | ICD-10-CM

## 2013-10-14 DIAGNOSIS — E1149 Type 2 diabetes mellitus with other diabetic neurological complication: Secondary | ICD-10-CM

## 2013-10-14 DIAGNOSIS — E1140 Type 2 diabetes mellitus with diabetic neuropathy, unspecified: Secondary | ICD-10-CM | POA: Diagnosis present

## 2013-10-14 DIAGNOSIS — E109 Type 1 diabetes mellitus without complications: Secondary | ICD-10-CM

## 2013-10-14 DIAGNOSIS — E1065 Type 1 diabetes mellitus with hyperglycemia: Secondary | ICD-10-CM

## 2013-10-14 DIAGNOSIS — R1110 Vomiting, unspecified: Secondary | ICD-10-CM

## 2013-10-14 DIAGNOSIS — Z79899 Other long term (current) drug therapy: Secondary | ICD-10-CM

## 2013-10-14 DIAGNOSIS — IMO0001 Reserved for inherently not codable concepts without codable children: Secondary | ICD-10-CM

## 2013-10-14 LAB — URINALYSIS, ROUTINE W REFLEX MICROSCOPIC
BILIRUBIN URINE: NEGATIVE
Glucose, UA: >1000 mg/dL — AB
KETONES UR: NEGATIVE mg/dL
LEUKOCYTES UA: NEGATIVE
NITRITE: NEGATIVE
Protein, ur: NEGATIVE mg/dL
Specific Gravity, Urine: 1.021 (ref 1.005–1.030)
UROBILINOGEN UA: 0.2 mg/dL (ref 0.0–1.0)
pH: 6 (ref 5.0–8.0)

## 2013-10-14 LAB — URINE MICROSCOPIC-ADD ON

## 2013-10-14 LAB — COMPREHENSIVE METABOLIC PANEL
ALBUMIN: 4 g/dL (ref 3.5–5.2)
ALT: 16 U/L (ref 0–53)
ANION GAP: 19 — AB (ref 5–15)
AST: 13 U/L (ref 0–37)
Alkaline Phosphatase: 96 U/L (ref 39–117)
BUN: 12 mg/dL (ref 6–23)
CALCIUM: 9.8 mg/dL (ref 8.4–10.5)
CO2: 23 meq/L (ref 19–32)
Chloride: 97 mEq/L (ref 96–112)
Creatinine, Ser: 1.38 mg/dL — ABNORMAL HIGH (ref 0.50–1.35)
GFR calc Af Amer: 74 mL/min — ABNORMAL LOW (ref >90)
GFR, EST NON AFRICAN AMERICAN: 64 mL/min — AB (ref >90)
Glucose, Bld: 353 mg/dL — ABNORMAL HIGH (ref 70–99)
Potassium: 4.5 mEq/L (ref 3.7–5.3)
Sodium: 139 mEq/L (ref 137–147)
Total Bilirubin: 0.4 mg/dL (ref 0.3–1.2)
Total Protein: 7.7 g/dL (ref 6.0–8.3)

## 2013-10-14 LAB — CBC WITH DIFFERENTIAL/PLATELET
BASOS ABS: 0 10*3/uL (ref 0.0–0.1)
BASOS PCT: 0 % (ref 0–1)
EOS PCT: 0 % (ref 0–5)
Eosinophils Absolute: 0 10*3/uL (ref 0.0–0.7)
HCT: 38.6 % — ABNORMAL LOW (ref 39.0–52.0)
HEMOGLOBIN: 12.9 g/dL — AB (ref 13.0–17.0)
Lymphocytes Relative: 15 % (ref 12–46)
Lymphs Abs: 1.4 10*3/uL (ref 0.7–4.0)
MCH: 26.5 pg (ref 26.0–34.0)
MCHC: 33.4 g/dL (ref 30.0–36.0)
MCV: 79.3 fL (ref 78.0–100.0)
MONOS PCT: 4 % (ref 3–12)
Monocytes Absolute: 0.4 10*3/uL (ref 0.1–1.0)
Neutro Abs: 8 10*3/uL — ABNORMAL HIGH (ref 1.7–7.7)
Neutrophils Relative %: 81 % — ABNORMAL HIGH (ref 43–77)
Platelets: 431 10*3/uL — ABNORMAL HIGH (ref 150–400)
RBC: 4.87 MIL/uL (ref 4.22–5.81)
RDW: 12.9 % (ref 11.5–15.5)
WBC: 9.8 10*3/uL (ref 4.0–10.5)

## 2013-10-14 LAB — GLUCOSE, CAPILLARY
Glucose-Capillary: 236 mg/dL — ABNORMAL HIGH (ref 70–99)
Glucose-Capillary: 190 mg/dL — ABNORMAL HIGH (ref 70–99)

## 2013-10-14 LAB — LIPASE, BLOOD: LIPASE: 20 U/L (ref 11–59)

## 2013-10-14 MED ORDER — INSULIN GLARGINE 100 UNIT/ML ~~LOC~~ SOLN
20.0000 [IU] | Freq: Every day | SUBCUTANEOUS | Status: DC
  Administered 2013-10-14: 20 [IU] via SUBCUTANEOUS
  Filled 2013-10-14: qty 0.2

## 2013-10-14 MED ORDER — PROMETHAZINE HCL 25 MG/ML IJ SOLN
25.0000 mg | Freq: Once | INTRAMUSCULAR | Status: AC
  Administered 2013-10-14: 25 mg via INTRAVENOUS
  Filled 2013-10-14 (×2): qty 1

## 2013-10-14 MED ORDER — INSULIN GLARGINE 100 UNIT/ML ~~LOC~~ SOLN
10.0000 [IU] | Freq: Every day | SUBCUTANEOUS | Status: DC
  Filled 2013-10-14: qty 0.1

## 2013-10-14 MED ORDER — ONDANSETRON HCL 4 MG/2ML IJ SOLN
4.0000 mg | Freq: Once | INTRAMUSCULAR | Status: AC
  Administered 2013-10-14: 4 mg via INTRAVENOUS
  Filled 2013-10-14: qty 2

## 2013-10-14 MED ORDER — HYDROMORPHONE HCL PF 1 MG/ML IJ SOLN
0.5000 mg | Freq: Once | INTRAMUSCULAR | Status: AC
  Administered 2013-10-14: 0.5 mg via INTRAVENOUS
  Filled 2013-10-14: qty 1

## 2013-10-14 MED ORDER — METOCLOPRAMIDE HCL 5 MG/ML IJ SOLN
5.0000 mg | Freq: Once | INTRAMUSCULAR | Status: AC
  Administered 2013-10-14: 5 mg via INTRAVENOUS
  Filled 2013-10-14: qty 2

## 2013-10-14 MED ORDER — SODIUM CHLORIDE 0.9 % IV SOLN
INTRAVENOUS | Status: DC
  Administered 2013-10-14 – 2013-10-18 (×6): via INTRAVENOUS

## 2013-10-14 MED ORDER — HYDRALAZINE HCL 20 MG/ML IJ SOLN
10.0000 mg | Freq: Four times a day (QID) | INTRAMUSCULAR | Status: DC | PRN
  Administered 2013-10-14 – 2013-10-17 (×3): 10 mg via INTRAVENOUS
  Filled 2013-10-14 (×3): qty 1

## 2013-10-14 MED ORDER — ACETAMINOPHEN 325 MG PO TABS: 650.0000 mg | ORAL_TABLET | Freq: Four times a day (QID) | ORAL | Status: DC | PRN

## 2013-10-14 MED ORDER — INSULIN ASPART 100 UNIT/ML ~~LOC~~ SOLN
0.0000 [IU] | Freq: Three times a day (TID) | SUBCUTANEOUS | Status: DC
  Administered 2013-10-14: 5 [IU] via SUBCUTANEOUS

## 2013-10-14 MED ORDER — PROMETHAZINE HCL 25 MG/ML IJ SOLN
12.5000 mg | Freq: Four times a day (QID) | INTRAMUSCULAR | Status: DC | PRN
  Administered 2013-10-14 – 2013-10-15 (×5): 25 mg via INTRAVENOUS
  Administered 2013-10-16: 12.5 mg via INTRAVENOUS
  Administered 2013-10-16 – 2013-10-17 (×5): 25 mg via INTRAVENOUS
  Filled 2013-10-14 (×12): qty 1

## 2013-10-14 MED ORDER — SODIUM CHLORIDE 0.9 % IV BOLUS (SEPSIS)
1000.0000 mL | INTRAVENOUS | Status: AC
  Administered 2013-10-14: 1000 mL via INTRAVENOUS

## 2013-10-14 MED ORDER — MORPHINE SULFATE 2 MG/ML IJ SOLN
1.0000 mg | INTRAMUSCULAR | Status: DC | PRN
  Administered 2013-10-14 – 2013-10-15 (×4): 2 mg via INTRAVENOUS
  Filled 2013-10-14 (×4): qty 1

## 2013-10-14 MED ORDER — SODIUM CHLORIDE 0.9 % IV SOLN
1000.0000 mL | Freq: Once | INTRAVENOUS | Status: AC
  Administered 2013-10-14: 1000 mL via INTRAVENOUS

## 2013-10-14 MED ORDER — PROMETHAZINE HCL 25 MG PO TABS
12.5000 mg | ORAL_TABLET | Freq: Four times a day (QID) | ORAL | Status: DC | PRN
  Administered 2013-10-18 – 2013-10-19 (×3): 12.5 mg via ORAL
  Filled 2013-10-14 (×3): qty 1

## 2013-10-14 MED ORDER — METOCLOPRAMIDE HCL 5 MG/ML IJ SOLN
10.0000 mg | Freq: Three times a day (TID) | INTRAMUSCULAR | Status: DC
  Administered 2013-10-14 – 2013-10-18 (×13): 10 mg via INTRAVENOUS
  Filled 2013-10-14 (×16): qty 2

## 2013-10-14 MED ORDER — HYDROMORPHONE HCL PF 1 MG/ML IJ SOLN
1.0000 mg | Freq: Once | INTRAMUSCULAR | Status: AC
  Administered 2013-10-14: 1 mg via INTRAVENOUS
  Filled 2013-10-14: qty 1

## 2013-10-14 MED ORDER — HEPARIN SODIUM (PORCINE) 5000 UNIT/ML IJ SOLN
5000.0000 [IU] | Freq: Three times a day (TID) | INTRAMUSCULAR | Status: DC
Start: 2013-10-14 — End: 2013-10-19
  Administered 2013-10-14 – 2013-10-19 (×15): 5000 [IU] via SUBCUTANEOUS
  Filled 2013-10-14 (×21): qty 1

## 2013-10-14 MED ORDER — ACETAMINOPHEN 650 MG RE SUPP
650.0000 mg | Freq: Four times a day (QID) | RECTAL | Status: DC | PRN
Start: 2013-10-14 — End: 2013-10-19
  Filled 2013-10-14: qty 1

## 2013-10-14 NOTE — ED Provider Notes (Signed)
CSN: [id number]     Arrival date & time 10/14/13  1015 History   First MD Initiated Contact with Patient 10/14/13 1037     Chief Complaint  Patient presents with  . Abdominal Pain  . Nausea  . Emesis     (Consider location/radiation/quality/duration/timing/severity/associated sxs/prior Treatment) Patient is a 38 y.o. male presenting with abdominal pain and vomiting. The history is provided by the patient.  Abdominal Pain Pain location:  Epigastric Pain quality: aching   Pain radiates to:  Does not radiate Pain severity:  Moderate Onset quality:  Sudden Duration:  8 hours Timing:  Constant Progression:  Unchanged Chronicity:  Recurrent Context comment:  At rest Relieved by:  Nothing Worsened by:  Nothing tried Ineffective treatments:  None tried Associated symptoms: nausea and vomiting   Associated symptoms: no chest pain, no cough, no diarrhea, no dysuria, no fever, no hematuria and no shortness of breath   Emesis Associated symptoms: abdominal pain   Associated symptoms: no diarrhea and no headaches     Past Medical History  Diagnosis Date  . Hypertension   . Diabetes mellitus without complication age 74    insulin requiring from the start  . Gastritis   . Gastroparesis 2012    100% gastric retention on 08/2013 [facility]  . Obesity   . Diabetic neuropathy    Past Surgical History  Procedure Laterality Date  . Cholecystectomy  2013  . Knee surgery Left   . Flexible sigmoidoscopy N/A 06/11/2013    Procedure: FLEXIBLE SIGMOIDOSCOPY;  Surgeon: [name], MD;  Location: [facility];  Service: Endoscopy;  Laterality: N/A;  . Esophagogastroduodenoscopy N/A 06/11/2013    Procedure: ESOPHAGOGASTRODUODENOSCOPY (EGD);  Surgeon: [name], MD;  Location: [facility];  Service: Endoscopy;  Laterality: N/A;   Family History  Problem Relation Age of Onset  . Diabetes Father    History  Substance Use Topics  . Smoking status: Never Smoker   . Smokeless tobacco: Never  Used  . Alcohol Use: Yes     Comment: "none really" - 08/27/13    Review of Systems  Constitutional: Negative for fever.  HENT: Negative for drooling and rhinorrhea.   Eyes: Negative for pain.  Respiratory: Negative for cough and shortness of breath.   Cardiovascular: Negative for chest pain and leg swelling.  Gastrointestinal: Positive for nausea, vomiting and abdominal pain. Negative for diarrhea.  Genitourinary: Negative for dysuria and hematuria.  Musculoskeletal: Negative for gait problem and neck pain.  Skin: Negative for color change.  Neurological: Negative for numbness and headaches.  Hematological: Negative for adenopathy.  Psychiatric/Behavioral: Negative for behavioral problems.  All other systems reviewed and are negative.     Allergies  Review of patient's allergies indicates no known allergies.  Home Medications   Prior to Admission medications   Medication Sig Start Date End Date Taking? Authorizing Provider  acetaminophen (TYLENOL) 500 MG tablet Take 500 mg by mouth every 6 (six) hours as needed for mild pain.    Yes Historical Provider, MD  amLODipine (NORVASC) 10 MG tablet Take 1 tablet (10 mg total) by mouth every morning. 08/29/13  Yes Venetia Maxon Rama, MD  cloNIDine (CATAPRES) 0.1 MG tablet Take 1 tablet (0.1 mg total) by mouth 2 (two) times daily. 08/29/13  Yes Venetia Maxon Rama, MD  erythromycin ethylsuccinate (EES) 200 MG/5ML suspension Take 10 mLs (400 mg total) by mouth every 8 (eight) hours. 10/06/13  Yes [name], MD  gabapentin (NEURONTIN) 300 MG capsule Take 1 capsule (  300 mg total) by mouth at bedtime. 08/29/13  Yes Christina P Rama, MD  glucose blood (ONE TOUCH ULTRA TEST) test strip Use as instructed to check blood sugar once a day dx code 250.00 10/08/13  Yes Elayne Snare, MD  HYDROcodone-acetaminophen (NORCO/VICODIN) 5-325 MG per tablet Take 1 tablet by mouth every 8 (eight) hours as needed for moderate pain. 10/06/13  Yes [name],  MD  insulin aspart (NOVOLOG) 100 UNIT/ML injection Inject 9-12 Units into the skin 3 (three) times daily before meals. SSI 08/29/13  Yes Christina P Rama, MD  insulin glargine (LANTUS) 100 UNIT/ML injection Inject 0.1 mLs (10 Units total) into the skin at bedtime. 10/06/13  Yes [name], MD  metoCLOPramide (REGLAN) 10 MG tablet Take 10 mg by mouth 4 (four) times daily.   Yes Historical Provider, MD  metoprolol (LOPRESSOR) 50 MG tablet Take 50 mg by mouth 2 (two) times daily.  08/20/13  Yes Historical Provider, MD  Jonetta Speak LANCETS FINE MISC Use one per day dx code 250.00 10/08/13  Yes Elayne Snare, MD  pantoprazole (PROTONIX) 40 MG tablet Take 1 tablet (40 mg total) by mouth every morning. 08/29/13  Yes Christina P Rama, MD  polyethylene glycol (MIRALAX / GLYCOLAX) packet Take 17 g by mouth 2 (two) times daily. 10/06/13  Yes [name], MD  promethazine (PHENERGAN) 25 MG suppository Place 1 suppository (25 mg total) rectally every 6 (six) hours as needed for nausea or vomiting. 10/06/13  Yes [name], MD  promethazine (PHENERGAN) 25 MG tablet Take 1 tablet (25 mg total) by mouth every 8 (eight) hours as needed for nausea or vomiting. 09/21/13  Yes Resa Miner Lawyer, PA-C  sucralfate (CARAFATE) 1 G tablet Take 1 g by mouth 4 (four) times daily -  before meals and at bedtime.   Yes Historical Provider, MD  traMADol (ULTRAM) 50 MG tablet Take 50 mg by mouth every 6 (six) hours as needed for moderate pain.  08/19/13  Yes Historical Provider, MD   BP 140/96  Pulse 137  Temp(Src) 98.3 F (36.8 C) (Oral)  Resp 18  SpO2 100% Physical Exam  Nursing note and vitals reviewed. Constitutional: He is oriented to person, place, and time. He appears well-developed and well-nourished.  HENT:  Head: Normocephalic and atraumatic.  Right Ear: External ear normal.  Left Ear: External ear normal.  Nose: Nose normal.  Mouth/Throat: Oropharynx is clear and moist. No oropharyngeal  exudate.  Eyes: Conjunctivae and EOM are normal. Pupils are equal, round, and reactive to light.  Neck: Normal range of motion. Neck supple.  Cardiovascular: Normal rate, regular rhythm, normal heart sounds and intact distal pulses.  Exam reveals no gallop and no friction rub.   No murmur heard. Pulmonary/Chest: Effort normal and breath sounds normal. No respiratory distress. He has no wheezes.  Abdominal: Soft. Bowel sounds are normal. He exhibits no distension. There is tenderness (mild epig ttp). There is no rebound and no guarding.  Musculoskeletal: Normal range of motion. He exhibits no edema and no tenderness.  Neurological: He is alert and oriented to person, place, and time.  Skin: Skin is warm and dry.  Psychiatric: He has a normal mood and affect. His behavior is normal.    ED Course  Procedures (including critical care time) Labs Review Labs Reviewed  CBC WITH DIFFERENTIAL - Abnormal; Notable for the following:    Hemoglobin 12.9 (*)    HCT 38.6 (*)    Platelets 431 (*)    Neutrophils Relative %  81 (*)    Neutro Abs 8.0 (*)    All other components within normal limits  COMPREHENSIVE METABOLIC PANEL - Abnormal; Notable for the following:    Glucose, Bld 353 (*)    Creatinine, Ser 1.38 (*)    GFR calc non Af Amer 64 (*)    GFR calc Af Amer 74 (*)    Anion gap 19 (*)    All other components within normal limits  URINALYSIS, ROUTINE W REFLEX MICROSCOPIC - Abnormal; Notable for the following:    Glucose, UA >1000 (*)    Hgb urine dipstick TRACE (*)    All other components within normal limits  LIPASE, BLOOD  URINE MICROSCOPIC-ADD ON    Imaging Review No results found.   EKG Interpretation None      MDM   Final diagnoses:  Gastroparesis  Intractable vomiting with nausea, vomiting of unspecified type    11:18 AM 38 y.o. male w hx of DM, gastroparesis who presents with nausea, vomiting, and epigastric pain which began early this morning and has persisted since  then. He is afebrile and tachycardic here. Abdomen soft with focal epigastric tenderness. Symptoms consistent with previous episodes of gastroparesis. Will attempt to get symptom control.  Will admit to hospitalist for sx control.     Pamella Pert, MD 10/14/13 [phone]

## 2013-10-14 NOTE — Progress Notes (Signed)
Paged Dr. Hartford Poli regarding patient's high blood pressure and to verify sliding scale coverage.

## 2013-10-14 NOTE — H&P (Signed)
[facility] History and Physical  Gean Hynes [id number] DOB: 30-Nov-1975 DOA: 10/14/2013  Referring physician: Dr. Aline Brochure PCP: [name], MD   Chief Complaint: Intractable nausea and vomiting  HPI: Duane [name] is a 38 y.o. male was past medical history of diabetes mellitus type 1, known history of gastroparesis . Patient came in to the hospital because of nausea and vomiting. Patient was recently admitted to the hospital with same symptomatology and discharge on 10/06/13. Patient said he was doing okay until today at 2 in the morning, when he started to vomit, he vomited enumerable times, patient vomited while a started to have him which is clear vomitus. Patient denies any blood or bile in his vomits, denies any diarrhea. Patient complaining about epigastric abdominal pain likely secondary to his vomiting. In the ED he was found to have mild acute kidney injury with creatinine of 1.38. He has hyperglycemia with glucose of 353 without any evidence of acidosis. Patient admitted to the hospital for further evaluation.   Review of Systems:  Constitutional: negative for anorexia, fevers and sweats Eyes: negative for irritation, redness and visual disturbance Ears, nose, mouth, throat, and face: negative for earaches, epistaxis, nasal congestion and sore throat Respiratory: negative for cough, dyspnea on exertion, sputum and wheezing Cardiovascular: negative for chest pain, dyspnea, lower extremity edema, orthopnea, palpitations and syncope Gastrointestinal: Complains about nausea and vomiting, epigastric abdominal pain Genitourinary:negative for dysuria, frequency and hematuria Hematologic/lymphatic: negative for bleeding, easy bruising and lymphadenopathy Musculoskeletal:negative for arthralgias, muscle weakness and stiff joints Neurological: negative for coordination problems, gait problems, headaches and weakness Endocrine: negative for diabetic symptoms including polydipsia,  polyuria and weight loss Allergic/Immunologic: negative for anaphylaxis, hay fever and urticaria  Past Medical History  Diagnosis Date  . Hypertension   . Diabetes mellitus without complication age 28    insulin requiring from the start  . Gastritis   . Gastroparesis 2012    100% gastric retention on 08/2013 G[facility]  . Obesity   . Diabetic neuropathy    Past Surgical History  Procedure Laterality Date  . Cholecystectomy  2013  . Knee surgery Left   . Flexible sigmoidoscopy N/A 06/11/2013    Procedure: FLEXIBLE SIGMOIDOSCOPY;  Surgeon: [name], MD;  Location: [facility];  Service: Endoscopy;  Laterality: N/A;  . Esophagogastroduodenoscopy N/A 06/11/2013    Procedure: ESOPHAGOGASTRODUODENOSCOPY (EGD);  Surgeon: [name], MD;  Location: [facility];  Service: Endoscopy;  Laterality: N/A;   Social History:   reports that he has never smoked. He has never used smokeless tobacco. He reports that he drinks alcohol. He reports that he does not use illicit drugs.  No Known Allergies  Family History  Problem Relation Age of Onset  . Diabetes Father      Prior to Admission medications   Medication Sig Start Date End Date Taking? Authorizing Provider  acetaminophen (TYLENOL) 500 MG tablet Take 500 mg by mouth every 6 (six) hours as needed for mild pain.    Yes Historical Provider, MD  amLODipine (NORVASC) 10 MG tablet Take 1 tablet (10 mg total) by mouth every morning. 08/29/13  Yes Venetia Maxon Rama, MD  cloNIDine (CATAPRES) 0.1 MG tablet Take 1 tablet (0.1 mg total) by mouth 2 (two) times daily. 08/29/13  Yes Venetia Maxon Rama, MD  erythromycin ethylsuccinate (EES) 200 MG/5ML suspension Take 10 mLs (400 mg total) by mouth every 8 (eight) hours. 10/06/13  Yes [name], MD  gabapentin (NEURONTIN) 300 MG capsule Take 1 capsule (300 mg  total) by mouth at bedtime. 08/29/13  Yes Christina P Rama, MD  glucose blood (ONE TOUCH ULTRA TEST) test strip Use as instructed to check  blood sugar once a day dx code 250.00 10/08/13  Yes Elayne Snare, MD  HYDROcodone-acetaminophen (NORCO/VICODIN) 5-325 MG per tablet Take 1 tablet by mouth every 8 (eight) hours as needed for moderate pain. 10/06/13  Yes [name], MD  insulin aspart (NOVOLOG) 100 UNIT/ML injection Inject 9-12 Units into the skin 3 (three) times daily before meals. SSI 08/29/13  Yes Christina P Rama, MD  insulin glargine (LANTUS) 100 UNIT/ML injection Inject 0.1 mLs (10 Units total) into the skin at bedtime. 10/06/13  Yes [name], MD  metoCLOPramide (REGLAN) 10 MG tablet Take 10 mg by mouth 4 (four) times daily.   Yes Historical Provider, MD  metoprolol (LOPRESSOR) 50 MG tablet Take 50 mg by mouth 2 (two) times daily.  08/20/13  Yes Historical Provider, MD  Jonetta Speak LANCETS FINE MISC Use one per day dx code 250.00 10/08/13  Yes Elayne Snare, MD  pantoprazole (PROTONIX) 40 MG tablet Take 1 tablet (40 mg total) by mouth every morning. 08/29/13  Yes Christina P Rama, MD  polyethylene glycol (MIRALAX / GLYCOLAX) packet Take 17 g by mouth 2 (two) times daily. 10/06/13  Yes [name], MD  promethazine (PHENERGAN) 25 MG suppository Place 1 suppository (25 mg total) rectally every 6 (six) hours as needed for nausea or vomiting. 10/06/13  Yes [name], MD  promethazine (PHENERGAN) 25 MG tablet Take 1 tablet (25 mg total) by mouth every 8 (eight) hours as needed for nausea or vomiting. 09/21/13  Yes Resa Miner Lawyer, PA-C  sucralfate (CARAFATE) 1 G tablet Take 1 g by mouth 4 (four) times daily -  before meals and at bedtime.   Yes Historical Provider, MD  traMADol (ULTRAM) 50 MG tablet Take 50 mg by mouth every 6 (six) hours as needed for moderate pain.  08/19/13  Yes Historical Provider, MD   Physical Exam: Filed Vitals:   10/14/13 1512  BP: 158/101  Pulse: 114  Temp:   Resp: 18   Constitutional: Well-developed African American male, he kept his eyes closed, no eye contact. Head:  Normocephalic and atraumatic.  Nose: Nose normal.  Mouth/Throat: Uvula is midline, oropharynx is clear and moist and mucous membranes are normal.  Eyes: Conjunctivae and EOM are normal. Pupils are equal, round, and reactive to light.  Neck: Trachea normal and normal range of motion. Neck supple.  Cardiovascular: Normal rate, regular rhythm, S1 normal, S2 normal, normal heart sounds and intact distal pulses.   Pulmonary/Chest: Effort normal and breath sounds normal.  Abdominal: Soft, bowel sounds heard, there is mild epigastric tenderness.  Musculoskeletal: Normal range of motion.  Neurological: Alert and oriented to person, place, and time. Has normal strength. No cranial nerve deficit or sensory deficit.  Skin: Skin is warm, dry and intact.  Psychiatric: Has a normal mood and affect. Speech is normal and behavior is normal.   Labs on Admission:  Basic Metabolic Panel:  Recent Labs Lab 10/14/13 1102  NA 139  K 4.5  CL 97  CO2 23  GLUCOSE 353*  BUN 12  CREATININE 1.38*  CALCIUM 9.8   Liver Function Tests:  Recent Labs Lab 10/14/13 1102  AST 13  ALT 16  ALKPHOS 96  BILITOT 0.4  PROT 7.7  ALBUMIN 4.0    Recent Labs Lab 10/14/13 1102  LIPASE 20   No results found for this  basename: AMMONIA,  in the last 168 hours CBC:  Recent Labs Lab 10/14/13 1102  WBC 9.8  NEUTROABS 8.0*  HGB 12.9*  HCT 38.6*  MCV 79.3  PLT 431*   Cardiac Enzymes: No results found for this basename: CKTOTAL, CKMB, CKMBINDEX, TROPONINI,  in the last 168 hours  BNP (last 3 results) No results found for this basename: PROBNP,  in the last 8760 hours CBG: No results found for this basename: GLUCAP,  in the last 168 hours  Radiological Exams on Admission: No results found.  EKG: Independently reviewed.   Assessment/Plan Principal Problem:   Intractable nausea and vomiting Active Problems:   IDDM (insulin dependent diabetes mellitus)   Diabetic gastroparesis   Diabetic  neuropathy   Gastroparesis   Acute kidney injury   Hyperglycemia due to type 1 diabetes mellitus    Intractable nausea and vomiting -Presented with nausea and vomiting started earlier today. -Likely secondary to gastroparesis flareup. -Started on scheduled Reglan and as needed Phenergan for nausea and vomiting. -Patient will be n.p.o. advance diet very carefully after vomiting stops.  Epigastric abdominal pain -Likely secondary to nausea and vomiting, patient said "you just made me worse" after I examined him. -Patient asked for Dilaudid by name, I tried to explain to him how narcotics in fact gastroparesis. -Patient refused Toradol, agreed to try morphine.  Hyperglycemia -In type I diabetic, without evidence of acidosis or ketosis. -Blood sugar is 353, started patient on insulin sliding scale, Lantus 20 units at night, as he will be n.p.o. -Hemoglobin A1c checked 2 weeks ago was 9.3. -His home dose according to Dr. Dwyane Dee note supposed to be 36 units at night.  Acute kidney injury -Likely secondary to dehydration from nausea and vomiting, patient started on IV fluids. -Check BMP in the morning.   Code Status: Full code Family Communication: Plan discussed with the patient. Disposition Plan: MedSurg  Time spent: 47 minutes  [facility] Pager [phone]

## 2013-10-14 NOTE — ED Notes (Signed)
Pt aware that he needs a urine sample.

## 2013-10-14 NOTE — Progress Notes (Signed)
Inpatient Diabetes Program Recommendations  AACE/ADA: New Consensus Statement on Inpatient Glycemic Control (2013)  Target Ranges:  Prepandial:   less than 140 mg/dL      Peak postprandial:   less than 180 mg/dL (1-2 hours)      Critically ill patients:  140 - 180 mg/dL   Reason for Visit: Hyperglycemia  Diabetes history: Type 2 DM Outpatient Diabetes medications: Lantus 36 units QHS, Novolog s/s Current orders for Inpatient glycemic control: Lantus 10 units QHS and Novolog moderate tidwc HgbA1C - 9.3%  38 y.o. male was past medical history of diabetes mellitus type 1, known history of gastroparesis . Patient came in to the hospital because of nausea and vomiting. Patient was recently admitted to the hospital with same diagnosis. Discharged on 8/28. Pt is NPO at present. Sees Dr. Dwyane Dee for diabetes. Insulin was adjusted this past week at [facility].  Uncontrolled blood sugars with gastroparesis.  Inpatient Diabetes Program Recommendations Insulin - Basal: Increase Lantus to 15 units QHS Correction (SSI): Change Novolog moderate to Q4H while NPO HgbA1C: 9.3% - uncontrolled at home Diet: NPO  Note: Pt has received inpatient diabetes education on numerous occasions. ? Whether psych consult might be beneficial.  Will continue to follow. Thank you. Lorenda Peck, RD, LDN, CDE Inpatient Diabetes Coordinator [phone]

## 2013-10-14 NOTE — Progress Notes (Signed)
  CARE MANAGEMENT ED NOTE 10/14/2013  Patient:  LUISEDUARDO, COLATO   Account Number:  [id number]  Date Initiated:  10/14/2013  Documentation initiated by:  Jackelyn Poling  Subjective/Objective Assessment:   38 yr old [place]     Subjective/Objective Assessment Detail:   Did the patient have a doctor visit between readmission visits? Yes with Dr Dwyane Dee for his DM not pcp See 10/08/13 notes from Dr Dwyane Dee in [facility] chart review section    Did the patient have timely phone support? No    Primary Care Physician:  ... Nnodi Not seen  Last admission 09/21/13 to 10/06/13 for Intractable nausea & vomiting IDDM, HTN, DM gastroparesis, DM neuropathy, unspecified constipation     Action/Plan:   ED Cm spoke with pt see above notes   Action/Plan Detail:   Anticipated DC Date:  10/15/2013     Status Recommendation to Physician:   Result of Recommendation:    Other ED [place]  Other  Outpatient Services - Pt will follow up    Choice offered to / List presented to:            Status of service:  Completed, signed off  ED Comments:   ED Comments Detail:

## 2013-10-14 NOTE — ED Notes (Signed)
Pt states hx of gastroparesis from diabetes.  C/o gen abd pain and vomiting since 0300 this morning.

## 2013-10-15 ENCOUNTER — Observation Stay (HOSPITAL_COMMUNITY): Payer: BC Managed Care – PPO

## 2013-10-15 DIAGNOSIS — Z794 Long term (current) use of insulin: Secondary | ICD-10-CM | POA: Diagnosis not present

## 2013-10-15 DIAGNOSIS — K117 Disturbances of salivary secretion: Secondary | ICD-10-CM | POA: Diagnosis present

## 2013-10-15 DIAGNOSIS — I10 Essential (primary) hypertension: Secondary | ICD-10-CM | POA: Diagnosis present

## 2013-10-15 DIAGNOSIS — E860 Dehydration: Secondary | ICD-10-CM | POA: Diagnosis present

## 2013-10-15 DIAGNOSIS — E1049 Type 1 diabetes mellitus with other diabetic neurological complication: Secondary | ICD-10-CM | POA: Diagnosis present

## 2013-10-15 DIAGNOSIS — Z79899 Other long term (current) drug therapy: Secondary | ICD-10-CM | POA: Diagnosis not present

## 2013-10-15 DIAGNOSIS — N179 Acute kidney failure, unspecified: Secondary | ICD-10-CM | POA: Diagnosis present

## 2013-10-15 DIAGNOSIS — K3184 Gastroparesis: Secondary | ICD-10-CM | POA: Diagnosis present

## 2013-10-15 DIAGNOSIS — E1065 Type 1 diabetes mellitus with hyperglycemia: Secondary | ICD-10-CM | POA: Diagnosis not present

## 2013-10-15 DIAGNOSIS — R112 Nausea with vomiting, unspecified: Secondary | ICD-10-CM | POA: Diagnosis present

## 2013-10-15 DIAGNOSIS — E1142 Type 2 diabetes mellitus with diabetic polyneuropathy: Secondary | ICD-10-CM | POA: Diagnosis present

## 2013-10-15 LAB — COMPREHENSIVE METABOLIC PANEL
ALBUMIN: 3.6 g/dL (ref 3.5–5.2)
ALK PHOS: 80 U/L (ref 39–117)
ALT: 14 U/L (ref 0–53)
AST: 11 U/L (ref 0–37)
Anion gap: 14 (ref 5–15)
BILIRUBIN TOTAL: 0.4 mg/dL (ref 0.3–1.2)
BUN: 9 mg/dL (ref 6–23)
CHLORIDE: 103 meq/L (ref 96–112)
CO2: 26 meq/L (ref 19–32)
CREATININE: 1.01 mg/dL (ref 0.50–1.35)
Calcium: 9.3 mg/dL (ref 8.4–10.5)
GFR calc Af Amer: >90 mL/min (ref >90)
GFR calc non Af Amer: >90 mL/min (ref >90)
Glucose, Bld: 165 mg/dL — ABNORMAL HIGH (ref 70–99)
POTASSIUM: 4.1 meq/L (ref 3.7–5.3)
Sodium: 143 mEq/L (ref 137–147)
Total Protein: 7.3 g/dL (ref 6.0–8.3)

## 2013-10-15 LAB — GLUCOSE, CAPILLARY
GLUCOSE-CAPILLARY: 134 mg/dL — AB (ref 70–99)
GLUCOSE-CAPILLARY: 143 mg/dL — AB (ref 70–99)
GLUCOSE-CAPILLARY: 152 mg/dL — AB (ref 70–99)
Glucose-Capillary: 152 mg/dL — ABNORMAL HIGH (ref 70–99)
Glucose-Capillary: 143 mg/dL — ABNORMAL HIGH (ref 70–99)
Glucose-Capillary: 143 mg/dL — ABNORMAL HIGH (ref 70–99)

## 2013-10-15 LAB — CBC
HEMATOCRIT: 37.2 % — AB (ref 39.0–52.0)
Hemoglobin: 12.4 g/dL — ABNORMAL LOW (ref 13.0–17.0)
MCH: 26.6 pg (ref 26.0–34.0)
MCHC: 33.3 g/dL (ref 30.0–36.0)
MCV: 79.8 fL (ref 78.0–100.0)
PLATELETS: 391 10*3/uL (ref 150–400)
RBC: 4.66 MIL/uL (ref 4.22–5.81)
RDW: 13.2 % (ref 11.5–15.5)
WBC: 10.1 10*3/uL (ref 4.0–10.5)

## 2013-10-15 LAB — RAPID URINE DRUG SCREEN, HOSP PERFORMED
Amphetamines: NOT DETECTED
BARBITURATES: POSITIVE — AB
BENZODIAZEPINES: NOT DETECTED
Cocaine: NOT DETECTED
Opiates: POSITIVE — AB
Tetrahydrocannabinol: NOT DETECTED

## 2013-10-15 MED ORDER — INSULIN ASPART 100 UNIT/ML ~~LOC~~ SOLN
0.0000 [IU] | SUBCUTANEOUS | Status: DC
  Administered 2013-10-15: 2 [IU] via SUBCUTANEOUS
  Administered 2013-10-15: 3 [IU] via SUBCUTANEOUS
  Administered 2013-10-15 – 2013-10-16 (×4): 2 [IU] via SUBCUTANEOUS
  Administered 2013-10-16 (×3): 3 [IU] via SUBCUTANEOUS
  Administered 2013-10-16: 2 [IU] via SUBCUTANEOUS
  Administered 2013-10-17: 3 [IU] via SUBCUTANEOUS
  Administered 2013-10-17 (×2): 2 [IU] via SUBCUTANEOUS
  Administered 2013-10-17 – 2013-10-18 (×5): 3 [IU] via SUBCUTANEOUS
  Administered 2013-10-19: 2 [IU] via SUBCUTANEOUS
  Administered 2013-10-19: 3 [IU] via SUBCUTANEOUS
  Administered 2013-10-19 (×2): 2 [IU] via SUBCUTANEOUS

## 2013-10-15 MED ORDER — INSULIN GLARGINE 100 UNIT/ML ~~LOC~~ SOLN
17.0000 [IU] | Freq: Every day | SUBCUTANEOUS | Status: DC
  Administered 2013-10-16 – 2013-10-18 (×3): 17 [IU] via SUBCUTANEOUS
  Filled 2013-10-15 (×4): qty 0.17

## 2013-10-15 MED ORDER — PANTOPRAZOLE SODIUM 40 MG IV SOLR
40.0000 mg | INTRAVENOUS | Status: DC
  Administered 2013-10-15 – 2013-10-19 (×5): 40 mg via INTRAVENOUS
  Filled 2013-10-15 (×5): qty 40

## 2013-10-15 MED ORDER — SODIUM CHLORIDE 0.9 % IV SOLN
250.0000 mg | Freq: Four times a day (QID) | INTRAVENOUS | Status: DC
  Administered 2013-10-15 – 2013-10-18 (×13): 250 mg via INTRAVENOUS
  Filled 2013-10-15 (×18): qty 5

## 2013-10-15 MED ORDER — MORPHINE SULFATE 2 MG/ML IJ SOLN
2.0000 mg | INTRAMUSCULAR | Status: DC | PRN
  Administered 2013-10-15 – 2013-10-18 (×20): 2 mg via INTRAVENOUS
  Filled 2013-10-15 (×21): qty 1

## 2013-10-15 MED ORDER — METOPROLOL TARTRATE 1 MG/ML IV SOLN
2.5000 mg | Freq: Four times a day (QID) | INTRAVENOUS | Status: DC
  Administered 2013-10-15 – 2013-10-16 (×4): 2.5 mg via INTRAVENOUS
  Filled 2013-10-15 (×8): qty 5

## 2013-10-15 NOTE — Progress Notes (Addendum)
PROGRESS NOTE  Duane [name] [id number] DOB: [birth year] DOA: 10/14/2013 PCP: [name], MD  Interim summary 38 year old male with a history of hypertension and poorly controlled diabetes mellitus type 1 with complications of gastroparesis and neuropathy presents with recurrent and intractable nausea and vomiting that started on the day of admission. The patient has had numerous admissions for the same complaints in the past 6 months. This is his sixth admission since April 2015. The patient has had a gastric emptying study on 08/18/2013 which revealed 100% retention of food products at 120 minutes. He had an EGD performed on 06/11/2013 which showed a hiatus hernia, but was normal otherwise. A flexible sigmoidoscopy was performed on 06/11/2013 which was normal up to the splenic flexure. Review of the medical record also reveals that the patient has a history of opioid seeking behavior. The patient has had multiple GI consults. They have recommended evaluation at a tertiary care center. The patient endorses compliance with all his medications including Reglan, erythromycin, Protonix, and Carafate.  Assessment/Plan: Intractable/recurrent nausea and vomiting -Secondary to diabetic gastroparesis -Gastroparesis has been confirmed by gastric emptying study on 08/18/2013 -Remain NPO -Continue IV Reglan and PPI -Add IV erythromycin  -Anti-emetics  -Minimize opioids  -Urine drug screen  -TSH  -A.m. cortisol  -Abdominal x-ray -I have discussed and offered the patient to try to arrange a transfer to [facility] or other tertiary care center--He presently refuses any transfer Abdominal pain -Patient has opioid seeking behavior -I discussed with the patient that increasing opioids can make his gastroparesis and vomiting worse -He is presently agreeable to remain on morphine -Hepatic enzymes and lipase are normal AKI -Secondary to volume depletion  -Improved with IV fluids  Diabetes  mellitus type 1, uncontrolled with complications of neuropathy and gastroparesis  -09/21/2013 hemoglobin A1c 9.3  -Question compliance with medication regimen  -adjust lantus to 17u at bedtime HTN -While the patient is npo and vomiting, start the patient on IV Lopressor until he is able to tolerate oral intake   Family Communication:   Pt at beside Disposition Plan:   Home when medically stable       Procedures/Studies: Dg Abd 1 View  10/05/2013   CLINICAL DATA:  Epigastric pain, constipation.  EXAM: ABDOMEN - 1 VIEW  COMPARISON:  September 25, 2013.  FINDINGS: The bowel gas pattern is normal. Status post cholecystectomy. Mild amount of stool burden is noted which is slightly decreased compared to prior exam. No radio-opaque calculi or other significant radiographic abnormality are seen.  IMPRESSION: Decreased amount of stool burden seen within the colon. No evidence of bowel obstruction or ileus is noted.   Electronically Signed   By: Sabino Dick M.D.   On: 10/05/2013 10:26   Dg Abd 2 Views  09/25/2013   CLINICAL DATA:  Nausea and vomiting  EXAM: ABDOMEN - 2 VIEW  COMPARISON:  09/22/2013  FINDINGS: Scattered large and small bowel gas is noted. Fecal material is noted throughout the colon without definitive obstructive change. No abnormal mass or abnormal calcifications are seen. No free air is noted.  IMPRESSION: No acute abnormality seen. A moderate to severe stool burden is noted but stable.   Electronically Signed   By: Inez Catalina M.D.   On: 09/25/2013 14:35   Dg Abd Acute W/chest  09/22/2013   CLINICAL DATA:  Nausea, vomiting and upper abdominal pain.  EXAM: ACUTE ABDOMEN SERIES (ABDOMEN 2 VIEW & CHEST 1 VIEW)  COMPARISON:  Chest and abdominal radiographs performed 08/17/2013  FINDINGS: The lungs are well-aerated and clear. There is no evidence of focal opacification, pleural effusion or pneumothorax. The cardiomediastinal silhouette is within normal limits.  The visualized bowel gas  pattern is unremarkable. Scattered stool and air are seen within the colon; there is no evidence of small bowel dilatation to suggest obstruction. No free intra-abdominal air is identified on the provided upright view. Clips are noted within the right upper quadrant, reflecting prior cholecystectomy.  No acute osseous abnormalities are seen; the sacroiliac joints are unremarkable in appearance.  IMPRESSION: 1. Unremarkable bowel gas pattern; no free intra-abdominal air seen. Moderate amount of stool noted in the colon. 2. No acute cardiopulmonary process identified.   Electronically Signed   By: Garald Balding M.D.   On: 09/22/2013 01:22         Subjective:  patient complains of recurrent vomiting without any hematemesis since admission. Denies any fevers, chills, chest pain, shortness breath, coughing, hemoptysis, dysuria, hematuria. Objective: Filed Vitals:   10/14/13 1638 10/14/13 1823 10/14/13 2235 10/15/13 0516  BP:  147/95 156/90 146/82  Pulse:   116 123  Temp:   99.1 F (37.3 C) 99.4 F (37.4 C)  TempSrc:   Oral Oral  Resp:   16 14  Height: 6\' 4"  (1.93 m)     SpO2:   96% 96%    Intake/Output Summary (Last 24 hours) at 10/15/13 0737 Last data filed at 10/15/13 0401  Gross per 24 hour  Intake      0 ml  Output   2850 ml  Net  -2850 ml   Weight change:  Exam:   General:  Pt is alert, follows commands appropriately, not in acute distress  HEENT: No icterus, No thrush, No neck mass, [place]/AT  Cardiovascular: RRR, S1/S2, no rubs, no gallops  Respiratory: CTA bilaterally, no wheezing, no crackles, no rhonchi  Abdomen: Soft/+BS, epigastric discomfort without any rebound, non distended, no guarding  Extremities: 1+LE edema, No lymphangitis, No petechiae, No rashes, no synovitis  Data Reviewed: Basic Metabolic Panel:  Recent Labs Lab 10/14/13 1102 10/15/13 0525  NA 139 143  K 4.5 4.1  CL 97 103  CO2 23 26  GLUCOSE 353* 165*  BUN 12 9  CREATININE 1.38* 1.01    CALCIUM 9.8 9.3   Liver Function Tests:  Recent Labs Lab 10/14/13 1102 10/15/13 0525  AST 13 11  ALT 16 14  ALKPHOS 96 80  BILITOT 0.4 0.4  PROT 7.7 7.3  ALBUMIN 4.0 3.6    Recent Labs Lab 10/14/13 1102  LIPASE 20   No results found for this basename: AMMONIA,  in the last 168 hours CBC:  Recent Labs Lab 10/14/13 1102 10/15/13 0525  WBC 9.8 10.1  NEUTROABS 8.0*  --   HGB 12.9* 12.4*  HCT 38.6* 37.2*  MCV 79.3 79.8  PLT 431* 391   Cardiac Enzymes: No results found for this basename: CKTOTAL, CKMB, CKMBINDEX, TROPONINI,  in the last 168 hours BNP: No components found with this basename: POCBNP,  CBG:  Recent Labs Lab 10/14/13 1712 10/14/13 2234 10/15/13 0354  GLUCAP 236* 190* 152*    No results found for this or any previous visit (from the past 240 hour(s)).   Scheduled Meds: . heparin  5,000 Units Subcutaneous 3 times per day  . insulin aspart  0-15 Units Subcutaneous TID WC  . insulin glargine  20 Units Subcutaneous QHS  . metoCLOPramide (REGLAN) injection  10 mg Intravenous 3  times per day   Continuous Infusions: . sodium chloride 100 mL/hr at 10/14/13 1718     Shayleigh Bouldin, DO  [facility] Pager [phone]  If 7PM-7AM, please contact night-coverage [email or web address] Password [email or web address] 10/15/2013, 7:37 AM   LOS: 1 day

## 2013-10-15 NOTE — Progress Notes (Signed)
UR Completed.  Duane [name] [phone] 10/15/2013  

## 2013-10-16 LAB — GLUCOSE, CAPILLARY
GLUCOSE-CAPILLARY: 167 mg/dL — AB (ref 70–99)
Glucose-Capillary: 145 mg/dL — ABNORMAL HIGH (ref 70–99)
Glucose-Capillary: 193 mg/dL — ABNORMAL HIGH (ref 70–99)
Glucose-Capillary: 160 mg/dL — ABNORMAL HIGH (ref 70–99)

## 2013-10-16 LAB — CBC
HCT: 38.5 % — ABNORMAL LOW (ref 39.0–52.0)
Hemoglobin: 12.6 g/dL — ABNORMAL LOW (ref 13.0–17.0)
MCH: 26.7 pg (ref 26.0–34.0)
MCHC: 32.7 g/dL (ref 30.0–36.0)
MCV: 81.6 fL (ref 78.0–100.0)
Platelets: 352 10*3/uL (ref 150–400)
RBC: 4.72 MIL/uL (ref 4.22–5.81)
RDW: 13 % (ref 11.5–15.5)
WBC: 8.2 10*3/uL (ref 4.0–10.5)

## 2013-10-16 LAB — BASIC METABOLIC PANEL
Anion gap: 13 (ref 5–15)
BUN: 8 mg/dL (ref 6–23)
CHLORIDE: 98 meq/L (ref 96–112)
CO2: 26 meq/L (ref 19–32)
Calcium: 9 mg/dL (ref 8.4–10.5)
Creatinine, Ser: 1.04 mg/dL (ref 0.50–1.35)
GFR calc Af Amer: >90 mL/min (ref >90)
GFR calc non Af Amer: >90 mL/min (ref >90)
Glucose, Bld: 151 mg/dL — ABNORMAL HIGH (ref 70–99)
Potassium: 3.8 mEq/L (ref 3.7–5.3)
SODIUM: 137 meq/L (ref 137–147)

## 2013-10-16 LAB — TSH: TSH: 1.02 u[IU]/mL (ref 0.350–4.500)

## 2013-10-16 MED ORDER — METOPROLOL TARTRATE 1 MG/ML IV SOLN
2.5000 mg | Freq: Once | INTRAVENOUS | Status: AC
  Administered 2013-10-16: 2.5 mg via INTRAVENOUS
  Filled 2013-10-16: qty 5

## 2013-10-16 MED ORDER — METOPROLOL TARTRATE 1 MG/ML IV SOLN
5.0000 mg | Freq: Four times a day (QID) | INTRAVENOUS | Status: DC
  Administered 2013-10-16 – 2013-10-18 (×8): 5 mg via INTRAVENOUS
  Filled 2013-10-16 (×12): qty 5

## 2013-10-16 NOTE — Consult Note (Signed)
Subjective:   HPI  The patient is a 38 year old male with a history of type 1 diabetes mellitus. He has a history of diabetic gastroparesis which he states was diagnosed about 3 years ago. A more recent gastric emptying study here has showed 100% gastric retention again confirming gastroparesis. He states his diabetes is not very well controlled, but he is now seeing Dr. Dwyane Dee from [facility] endocrinology with hopes of getting the diabetes under better control. He uses narcotic analgesics apparently for abdominal pains. He was seen in the hospital in April as well as August 2015 by the [facility] gastroenterology group. He has seen Dr Henrene Pastor, Rolla Flatten and Ardis Hughs. The last progress note from Dr. Ardis Hughs was 10/05/2013 (11 days ago). [facility] was called by the hospitalists to see again, but according to the hospitalists they would not see him, and considered him an unassigned patient. He had an EGD in April of this year by [name] and only a small hiatal hernia was found. [facility] was called to see the patient. He has had another admission to the hospital for nausea and vomiting. Denied hematemesis. States he has upper abdominal pain, which he has had for years. Per review of chart he has been asking for narcotic pain medications. Per review of past charts he has been instructed to avoid narcotic analgesics. He has been treated as an outpatient with Reglan 4 times a day. It has been suggested that he should be referred to [facility] to see Dr. Derrill Kay.  Review of Systems Denies chest pain or shortness of breath  Past Medical History  Diagnosis Date  . Hypertension   . Diabetes mellitus without complication age 52    insulin requiring from the start  . Gastritis   . Gastroparesis 2012    100% gastric retention on 08/2013 [facility]  . Obesity   . Diabetic neuropathy    Past Surgical History  Procedure Laterality Date  . Cholecystectomy  2013  . Knee surgery Left   . Flexible sigmoidoscopy N/A 06/11/2013     Procedure: FLEXIBLE SIGMOIDOSCOPY;  Surgeon: [name], MD;  Location: [facility] ENDOSCOPY;  Service: Endoscopy;  Laterality: N/A;  . Esophagogastroduodenoscopy N/A 06/11/2013    Procedure: ESOPHAGOGASTRODUODENOSCOPY (EGD);  Surgeon: [name], MD;  Location: [facility];  Service: Endoscopy;  Laterality: N/A;   History   Social History  . Marital Status: Single    Spouse Name: N/A    Number of Children: N/A  . Years of Education: N/A   Occupational History  . 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    Social History Main Topics  . Smoking status: Never Smoker   . Smokeless tobacco: Never Used  . Alcohol Use: Yes     Comment: "none really" - 08/27/13  . Drug Use: No  . Sexual Activity: Yes   Other Topics Concern  . Not on file   Social History Narrative  . No narrative on file   family history includes Diabetes in his father. Current facility-administered medications:0.9 %  sodium chloride infusion, , Intravenous, Continuous, [name], MD, Last Rate: 100 mL/hr at 10/16/13 0527;  acetaminophen (TYLENOL) suppository 650 mg, 650 mg, Rectal, Q6H PRN, [name], MD;  acetaminophen (TYLENOL) tablet 650 mg, 650 mg, Oral, Q6H PRN, [name], MD erythromycin 250 mg in sodium chloride 0.9 % 100 mL IVPB, 250 mg, Intravenous, 4 times per day, Orson Eva, MD, 250 mg at 10/16/13 [id number];  heparin injection 5,000 Units, 5,000 Units, Subcutaneous, 3 times per day, [name], MD, 5,000 Units at 10/16/13 0527;  hydrALAZINE (APRESOLINE) injection 10 mg, 10 mg, Intravenous, Q6H PRN, [name], MD, 10 mg at 10/14/13 1758 insulin aspart (novoLOG) injection 0-15 Units, 0-15 Units, Subcutaneous, Q4H, Orson Eva, MD, 2 Units at 10/16/13 [phone];  insulin glargine (LANTUS) injection 17 Units, 17 Units, Subcutaneous, QHS, Orson Eva, MD;  metoCLOPramide (REGLAN) injection 10 mg, 10 mg, Intravenous, 3 times per day, [name], MD, 10 mg at 10/16/13 0528;  metoprolol  (LOPRESSOR) injection 5 mg, 5 mg, Intravenous, 4 times per day, Orson Eva, MD morphine 2 MG/ML injection 2 mg, 2 mg, Intravenous, Q3H PRN, Orson Eva, MD, 2 mg at 10/16/13 0303;  pantoprazole (PROTONIX) injection 40 mg, 40 mg, Intravenous, Q24H, Orson Eva, MD, 40 mg at 10/15/13 1135;  promethazine (PHENERGAN) injection 12.5-25 mg, 12.5-25 mg, Intravenous, Q6H PRN, [name], MD, 25 mg at 10/16/13 [id number];  promethazine (PHENERGAN) tablet 12.5 mg, 12.5 mg, Oral, Q6H PRN, [name], MD No Known Allergies   Objective:     BP 152/84  Pulse 116  Temp(Src) 99.4 F (37.4 C) (Oral)  Resp 16  Ht 6\' 4"  (1.93 m)  Wt 124.739 kg (275 lb)  BMI 33.49 kg/m2  SpO2 94%  He does not appear in any acute distress  Nonicteric  Heart regular rhythm no murmurs  Lungs clear  Abdomen: Bowel sounds present, soft, mild tenderness in epigastric area  Laboratory No components found with this basename: d1      Assessment:     Diabetic gastroparesis      Plan:     Avoid narcotics. Continue Reglan. Try to get diabetes under as best control as possible. Consider referral to Dr. Derrill Kay at [facility]. These suggestions are essentially the same as given to the patient from the [facility] group previously saw him. I have nothing more to add. Lab Results  Component Value Date   HGB 12.6* 10/16/2013   HGB 12.4* 10/15/2013   HGB 12.9* 10/14/2013   HCT 38.5* 10/16/2013   HCT 37.2* 10/15/2013   HCT 38.6* 10/14/2013   ALKPHOS 80 10/15/2013   ALKPHOS 96 10/14/2013   ALKPHOS 84 10/04/2013   AST 11 10/15/2013   AST 13 10/14/2013   AST 8 10/04/2013   ALT 14 10/15/2013   ALT 16 10/14/2013   ALT 8 10/04/2013      Component Value Date/Time   WBC 8.2 10/16/2013 0655   HGB 12.6* 10/16/2013 0655   HCT 38.5* 10/16/2013 0655   PLT 352 10/16/2013 0655   ALT 14 10/15/2013 0525   AST 11 10/15/2013 0525   NA 137 10/16/2013 0655   K 3.8 10/16/2013 0655   CL 98 10/16/2013 0655   CREATININE 1.04 10/16/2013 0655   BUN 8 10/16/2013 0655   CO2 26 10/16/2013 0655    CALCIUM 9.0 10/16/2013 0655   PHOS 2.5 06/04/2013 1223   ALKPHOS 80 10/15/2013 0525

## 2013-10-16 NOTE — Progress Notes (Signed)
PROGRESS NOTE  Duane [name] [id number] DOB: [birth year] DOA: 10/14/2013 PCP: [name], MD  Assessment/Plan: Intractable/recurrent nausea and vomiting  -Secondary to diabetic gastroparesis  -Gastroparesis has been confirmed by gastric emptying study on 08/18/2013  -Advance to clear liquids today -Patient remains very focused upon receiving IV Dilaudid--I informed the patient that opioids will make his gastroparesis worse  -I informed patient that I will not be changing his IV morphine to Dilaudid - although the patient states that he has had numerous episodes of emesis, this has not been witnessed by ancillary staff  -RN reports the patient has had numerous episodes of hypersalivation without actual emesis -Continue IV Reglan and PPI  -Add IV erythromycin  -Anti-emetics  -Minimize opioids  -Urine drug screen  -TSH--pending  -A.m. Cortisol--pending -Abdominal x-ray--nonobstructive  -I have discussed and offered the patient to try to arrange a transfer to [facility] or other tertiary care center--He continues to refuse any transfer  -requested GI consult Abdominal pain  -Patient has opioid seeking behavior  -Patient received 14 mg of IV morphine on 10/15/2013, and 2 mg IV morphine since midnight 10/16/2013 -I discussed with the patient that increasing opioids can make his gastroparesis and vomiting worse  -endorsed pain out of proportion with clinical exam--no pain when pt is distracted -Hepatic enzymes and lipase are normal  AKI  -Secondary to volume depletion  -Improved with IV fluids  Diabetes mellitus type 1, uncontrolled with complications of neuropathy and gastroparesis  -09/21/2013 hemoglobin A1c 9.3  -Question compliance with medication regimen  -adjust lantus to 17u at bedtime  -CBGs fairly controlled HTN  -While the patient is npo and vomiting, start the patient on IV Lopressor until he is able to tolerate oral intake  -increase IV lopressor to 5mg  q 6  hrs  Family Communication: Pt at beside  Disposition Plan: Home when medically stable         Procedures/Studies: Dg Abd 1 View  10/05/2013   CLINICAL DATA:  Epigastric pain, constipation.  EXAM: ABDOMEN - 1 VIEW  COMPARISON:  September 25, 2013.  FINDINGS: The bowel gas pattern is normal. Status post cholecystectomy. Mild amount of stool burden is noted which is slightly decreased compared to prior exam. No radio-opaque calculi or other significant radiographic abnormality are seen.  IMPRESSION: Decreased amount of stool burden seen within the colon. No evidence of bowel obstruction or ileus is noted.   Electronically Signed   By: Sabino Dick M.D.   On: 10/05/2013 10:26   Dg Abd 2 Views  10/15/2013   CLINICAL DATA:  Abdominal pain, nausea, vomiting, history hypertension, diabetes, gastroparesis  EXAM: ABDOMEN - 2 VIEW  COMPARISON:  10/05/2013  FINDINGS: Lung bases grossly clear.  Nonobstructive bowel gas pattern.  Scattered stool throughout ascending and proximal transverse colon.  No bowel dilatation, bowel wall thickening or free air.  Bones normal appearance.  No definite urinary tract calcification.  IMPRESSION: Normal exam.   Electronically Signed   By: Lavonia Dana M.D.   On: 10/15/2013 10:34   Dg Abd 2 Views  09/25/2013   CLINICAL DATA:  Nausea and vomiting  EXAM: ABDOMEN - 2 VIEW  COMPARISON:  09/22/2013  FINDINGS: Scattered large and small bowel gas is noted. Fecal material is noted throughout the colon without definitive obstructive change. No abnormal mass or abnormal calcifications are seen. No free air is noted.  IMPRESSION: No acute abnormality seen. A moderate to severe stool burden is noted but  stable.   Electronically Signed   By: Inez Catalina M.D.   On: 09/25/2013 14:35   Dg Abd Acute W/chest  09/22/2013   CLINICAL DATA:  Nausea, vomiting and upper abdominal pain.  EXAM: ACUTE ABDOMEN SERIES (ABDOMEN 2 VIEW & CHEST 1 VIEW)  COMPARISON:  Chest and abdominal radiographs performed  08/17/2013  FINDINGS: The lungs are well-aerated and clear. There is no evidence of focal opacification, pleural effusion or pneumothorax. The cardiomediastinal silhouette is within normal limits.  The visualized bowel gas pattern is unremarkable. Scattered stool and air are seen within the colon; there is no evidence of small bowel dilatation to suggest obstruction. No free intra-abdominal air is identified on the provided upright view. Clips are noted within the right upper quadrant, reflecting prior cholecystectomy.  No acute osseous abnormalities are seen; the sacroiliac joints are unremarkable in appearance.  IMPRESSION: 1. Unremarkable bowel gas pattern; no free intra-abdominal air seen. Moderate amount of stool noted in the colon. 2. No acute cardiopulmonary process identified.   Electronically Signed   By: Garald Balding M.D.   On: 09/22/2013 01:22         Subjective:  patient denies any fevers, chills, chest shortness breath, coughing, hemoptysis. He complains of nausea and vomiting without any hematemesis. He is passing flatus but has not had any bowel movements. Denies any headache, dysuria, hematuria, rashes   Objective: Filed Vitals:   10/15/13 1900 10/15/13 2017 10/15/13 2302 10/16/13 0353  BP: 180/106 176/116 162/93 152/84  Pulse: 114 111 119 116  Temp:  99.2 F (37.3 C)  99.4 F (37.4 C)  TempSrc:  Oral  Oral  Resp:  16 16 16   Height:  6\' 4"  (1.93 m)    Weight:  124.739 kg (275 lb)    SpO2:  99%  94%    Intake/Output Summary (Last 24 hours) at 10/16/13 0950 Last data filed at 10/16/13 0900  Gross per 24 hour  Intake   1200 ml  Output   2025 ml  Net   -825 ml   Weight change:  Exam:   General:  Pt is alert, follows commands appropriately, not in acute distress  HEENT: No icterus, No thrush,  [place]/AT  Cardiovascular: RRR, S1/S2, no rubs, no gallops  Respiratory: CTA bilaterally, no wheezing, no crackles, no rhonchi  Abdomen: Soft/+BS, non tender, non  distended, no guarding  Extremities: No edema, No lymphangitis, No petechiae, No rashes, no synovitis  Data Reviewed: Basic Metabolic Panel:  Recent Labs Lab 10/14/13 1102 10/15/13 0525 10/16/13 0655  NA 139 143 137  K 4.5 4.1 3.8  CL 97 103 98  CO2 23 26 26   GLUCOSE 353* 165* 151*  BUN 12 9 8   CREATININE 1.38* 1.01 1.04  CALCIUM 9.8 9.3 9.0   Liver Function Tests:  Recent Labs Lab 10/14/13 1102 10/15/13 0525  AST 13 11  ALT 16 14  ALKPHOS 96 80  BILITOT 0.4 0.4  PROT 7.7 7.3  ALBUMIN 4.0 3.6    Recent Labs Lab 10/14/13 1102  LIPASE 20   No results found for this basename: AMMONIA,  in the last 168 hours CBC:  Recent Labs Lab 10/14/13 1102 10/15/13 0525 10/16/13 0655  WBC 9.8 10.1 8.2  NEUTROABS 8.0*  --   --   HGB 12.9* 12.4* 12.6*  HCT 38.6* 37.2* 38.5*  MCV 79.3 79.8 81.6  PLT 431* 391 352   Cardiac Enzymes: No results found for this basename: CKTOTAL, CKMB, CKMBINDEX, TROPONINI,  in the  last 168 hours BNP: No components found with this basename: POCBNP,  CBG:  Recent Labs Lab 10/15/13 1155 10/15/13 1558 10/15/13 1958 10/15/13 2337 10/16/13 0348  GLUCAP 143* 143* 134* 152* 145*    No results found for this or any previous visit (from the past 240 hour(s)).   Scheduled Meds: . erythromycin  250 mg Intravenous 4 times per day  . heparin  5,000 Units Subcutaneous 3 times per day  . insulin aspart  0-15 Units Subcutaneous Q4H  . insulin glargine  17 Units Subcutaneous QHS  . metoCLOPramide (REGLAN) injection  10 mg Intravenous 3 times per day  . metoprolol  5 mg Intravenous 4 times per day  . pantoprazole (PROTONIX) IV  40 mg Intravenous Q24H   Continuous Infusions: . sodium chloride 100 mL/hr at 10/16/13 0527     Yared Barefoot, DO  [facility] Pager [phone]  If 7PM-7AM, please contact night-coverage [email or web address] Password [email or web address] 10/16/2013, 9:50 AM   LOS: 2 days

## 2013-10-17 LAB — COMPREHENSIVE METABOLIC PANEL
ALBUMIN: 3.7 g/dL (ref 3.5–5.2)
ALT: 14 U/L (ref 0–53)
ANION GAP: 19 — AB (ref 5–15)
AST: 14 U/L (ref 0–37)
Alkaline Phosphatase: 80 U/L (ref 39–117)
BUN: 9 mg/dL (ref 6–23)
CO2: 21 mEq/L (ref 19–32)
CREATININE: 0.98 mg/dL (ref 0.50–1.35)
Calcium: 9.1 mg/dL (ref 8.4–10.5)
Chloride: 97 mEq/L (ref 96–112)
GFR calc Af Amer: >90 mL/min (ref >90)
GFR calc non Af Amer: >90 mL/min (ref >90)
Glucose, Bld: 165 mg/dL — ABNORMAL HIGH (ref 70–99)
Potassium: 4.5 mEq/L (ref 3.7–5.3)
Sodium: 137 mEq/L (ref 137–147)
TOTAL PROTEIN: 7.2 g/dL (ref 6.0–8.3)
Total Bilirubin: 0.5 mg/dL (ref 0.3–1.2)

## 2013-10-17 LAB — GLUCOSE, CAPILLARY
GLUCOSE-CAPILLARY: 147 mg/dL — AB (ref 70–99)
Glucose-Capillary: 145 mg/dL — ABNORMAL HIGH (ref 70–99)
Glucose-Capillary: 153 mg/dL — ABNORMAL HIGH (ref 70–99)
Glucose-Capillary: 157 mg/dL — ABNORMAL HIGH (ref 70–99)
Glucose-Capillary: 157 mg/dL — ABNORMAL HIGH (ref 70–99)
Glucose-Capillary: 160 mg/dL — ABNORMAL HIGH (ref 70–99)

## 2013-10-17 LAB — CBC
HCT: 39.5 % (ref 39.0–52.0)
Hemoglobin: 13.3 g/dL (ref 13.0–17.0)
MCH: 26.9 pg (ref 26.0–34.0)
MCHC: 33.7 g/dL (ref 30.0–36.0)
MCV: 79.8 fL (ref 78.0–100.0)
PLATELETS: 371 10*3/uL (ref 150–400)
RBC: 4.95 MIL/uL (ref 4.22–5.81)
RDW: 12.8 % (ref 11.5–15.5)
WBC: 9.5 10*3/uL (ref 4.0–10.5)

## 2013-10-17 LAB — CORTISOL-AM, BLOOD: Cortisol - AM: 25.3 ug/dL — ABNORMAL HIGH (ref 4.3–22.4)

## 2013-10-17 MED ORDER — PROMETHAZINE HCL 25 MG/ML IJ SOLN
12.5000 mg | Freq: Once | INTRAMUSCULAR | Status: AC
  Administered 2013-10-17: 12.5 mg via INTRAVENOUS
  Filled 2013-10-17: qty 1

## 2013-10-17 MED ORDER — PROMETHAZINE HCL 25 MG/ML IJ SOLN: 12.5000 mg | Freq: Four times a day (QID) | INTRAMUSCULAR | Status: DC | PRN

## 2013-10-17 MED ORDER — PROMETHAZINE HCL 25 MG/ML IJ SOLN
12.5000 mg | Freq: Four times a day (QID) | INTRAMUSCULAR | Status: DC | PRN
  Administered 2013-10-17 – 2013-10-18 (×3): 12.5 mg via INTRAVENOUS
  Filled 2013-10-17 (×3): qty 1

## 2013-10-17 NOTE — Progress Notes (Signed)
PROGRESS NOTE  Duane [name] [id number] DOB: 1975-08-15 DOA: 10/14/2013 PCP: [name], MD  Assessment/Plan: Intractable/recurrent nausea and vomiting  -Secondary to diabetic gastroparesis  -Gastroparesis has been confirmed by gastric emptying study on 08/18/2013  -continue clear liquids today  -Patient remains very focused upon receiving IV Dilaudid--I informed the patient that opioids will make his gastroparesis worse  -I informed patient that I will not be changing his IV morphine to Dilaudid  -although the patient states that he has had numerous episodes of emesis, this has not been witnessed by ancillary staff  -RN reports the patient has had numerous episodes of hypersalivation without actual emesis  -Continue IV Reglan and PPI  -Continue IV erythromycin  -Anti-emetics  -Minimize opioids   -TSH--1.020 -A.m. Cortisol--pending  -Abdominal x-ray--nonobstructive  -I have discussed and offered the patient to try to arrange a transfer to [facility] or other tertiary care center--He continues to refuse any transfer  -requested GI consult  Abdominal pain  -Patient has opioid seeking behavior  -Patient received 14 mg of IV morphine on 10/15/2013, and 10 mg IV morphine on 10/16/2013  -I discussed with the patient that increasing opioids can make his gastroparesis and vomiting worse  -endorsed pain out of proportion with clinical exam--no pain when pt is distracted  -Hepatic enzymes and lipase are normal  AKI  -Secondary to volume depletion  -Improved with IV fluids  Diabetes mellitus type 1, uncontrolled with complications of neuropathy and gastroparesis  -09/21/2013 hemoglobin A1c 9.3  -Question compliance with medication regimen  -adjust lantus to 17u at bedtime  -CBGs fairly controlled  HTN  -While the patient is npo and vomiting, start the patient on IV Lopressor until he is able to tolerate oral intake  -continue IV lopressor to 5mg  q 6 hrs  Family Communication:  Pt at beside  Disposition Plan: Home when medically stable          Procedures/Studies: Dg Abd 1 View  10/05/2013   CLINICAL DATA:  Epigastric pain, constipation.  EXAM: ABDOMEN - 1 VIEW  COMPARISON:  September 25, 2013.  FINDINGS: The bowel gas pattern is normal. Status post cholecystectomy. Mild amount of stool burden is noted which is slightly decreased compared to prior exam. No radio-opaque calculi or other significant radiographic abnormality are seen.  IMPRESSION: Decreased amount of stool burden seen within the colon. No evidence of bowel obstruction or ileus is noted.   Electronically Signed   By: Sabino Dick M.D.   On: 10/05/2013 10:26   Dg Abd 2 Views  10/15/2013   CLINICAL DATA:  Abdominal pain, nausea, vomiting, history hypertension, diabetes, gastroparesis  EXAM: ABDOMEN - 2 VIEW  COMPARISON:  10/05/2013  FINDINGS: Lung bases grossly clear.  Nonobstructive bowel gas pattern.  Scattered stool throughout ascending and proximal transverse colon.  No bowel dilatation, bowel wall thickening or free air.  Bones normal appearance.  No definite urinary tract calcification.  IMPRESSION: Normal exam.   Electronically Signed   By: Lavonia Dana M.D.   On: 10/15/2013 10:34   Dg Abd 2 Views  09/25/2013   CLINICAL DATA:  Nausea and vomiting  EXAM: ABDOMEN - 2 VIEW  COMPARISON:  09/22/2013  FINDINGS: Scattered large and small bowel gas is noted. Fecal material is noted throughout the colon without definitive obstructive change. No abnormal mass or abnormal calcifications are seen. No free air is noted.  IMPRESSION: No acute abnormality seen. A moderate to severe stool burden is  noted but stable.   Electronically Signed   By: Inez Catalina M.D.   On: 09/25/2013 14:35   Dg Abd Acute W/chest  09/22/2013   CLINICAL DATA:  Nausea, vomiting and upper abdominal pain.  EXAM: ACUTE ABDOMEN SERIES (ABDOMEN 2 VIEW & CHEST 1 VIEW)  COMPARISON:  Chest and abdominal radiographs performed 08/17/2013  FINDINGS: The  lungs are well-aerated and clear. There is no evidence of focal opacification, pleural effusion or pneumothorax. The cardiomediastinal silhouette is within normal limits.  The visualized bowel gas pattern is unremarkable. Scattered stool and air are seen within the colon; there is no evidence of small bowel dilatation to suggest obstruction. No free intra-abdominal air is identified on the provided upright view. Clips are noted within the right upper quadrant, reflecting prior cholecystectomy.  No acute osseous abnormalities are seen; the sacroiliac joints are unremarkable in appearance.  IMPRESSION: 1. Unremarkable bowel gas pattern; no free intra-abdominal air seen. Moderate amount of stool noted in the colon. 2. No acute cardiopulmonary process identified.   Electronically Signed   By: Garald Balding M.D.   On: 09/22/2013 01:22         Subjective: Patient states that he is only taking sips of water. He states that he is still having hypersalivation without any actual vomiting. He continues to have epigastric discomfort and pain. Denies fevers, chills, chest pain, shortness breath, cough, hemoptysis, diarrhea  Objective: Filed Vitals:   10/16/13 1352 10/16/13 2200 10/16/13 2328 10/17/13 0520  BP: 156/55 181/113 174/95 148/93  Pulse: 100 115 118 107  Temp: 98.3 F (36.8 C) 98.9 F (37.2 C)  98.9 F (37.2 C)  TempSrc: Oral Oral  Oral  Resp: 16 20  20   Height:      Weight:      SpO2: 95% 96%  98%    Intake/Output Summary (Last 24 hours) at 10/17/13 1123 Last data filed at 10/17/13 0520  Gross per 24 hour  Intake   1425 ml  Output   1250 ml  Net    175 ml   Weight change:  Exam:   General:  Pt is alert, follows commands appropriately, not in acute distress  HEENT: No icterus, No thrush,  [place]/AT  Cardiovascular: RRR, S1/S2, no rubs, no gallops  Respiratory: CTA bilaterally, no wheezing, no crackles, no rhonchi  Abdomen: Soft/+BS, non tender, non distended, no  guarding  Extremities: No edema, No lymphangitis, No petechiae, No rashes, no synovitis  Data Reviewed: Basic Metabolic Panel:  Recent Labs Lab 10/14/13 1102 10/15/13 0525 10/16/13 0655 10/17/13 0500  NA 139 143 137 137  K 4.5 4.1 3.8 4.5  CL 97 103 98 97  CO2 23 26 26 21   GLUCOSE 353* 165* 151* 165*  BUN 12 9 8 9   CREATININE 1.38* 1.01 1.04 0.98  CALCIUM 9.8 9.3 9.0 9.1   Liver Function Tests:  Recent Labs Lab 10/14/13 1102 10/15/13 0525 10/17/13 0500  AST 13 11 14   ALT 16 14 14   ALKPHOS 96 80 80  BILITOT 0.4 0.4 0.5  PROT 7.7 7.3 7.2  ALBUMIN 4.0 3.6 3.7    Recent Labs Lab 10/14/13 1102  LIPASE 20   No results found for this basename: AMMONIA,  in the last 168 hours CBC:  Recent Labs Lab 10/14/13 1102 10/15/13 0525 10/16/13 0655 10/17/13 0629  WBC 9.8 10.1 8.2 9.5  NEUTROABS 8.0*  --   --   --   HGB 12.9* 12.4* 12.6* 13.3  HCT 38.6* 37.2* 38.5*  39.5  MCV 79.3 79.8 81.6 79.8  PLT 431* 391 352 371   Cardiac Enzymes: No results found for this basename: CKTOTAL, CKMB, CKMBINDEX, TROPONINI,  in the last 168 hours BNP: No components found with this basename: POCBNP,  CBG:  Recent Labs Lab 10/16/13 1643 10/16/13 2003 10/16/13 2305 10/17/13 0426 10/17/13 0806  GLUCAP 193* 167* 160* 145* 147*    No results found for this or any previous visit (from the past 240 hour(s)).   Scheduled Meds: . erythromycin  250 mg Intravenous 4 times per day  . heparin  5,000 Units Subcutaneous 3 times per day  . insulin aspart  0-15 Units Subcutaneous Q4H  . insulin glargine  17 Units Subcutaneous QHS  . metoCLOPramide (REGLAN) injection  10 mg Intravenous 3 times per day  . metoprolol  5 mg Intravenous 4 times per day  . pantoprazole (PROTONIX) IV  40 mg Intravenous Q24H   Continuous Infusions: . sodium chloride 100 mL/hr at 10/17/13 0537     Larsen Dungan, DO  [facility] Pager [phone]  If 7PM-7AM, please contact  night-coverage [email or web address] Password [email or web address] 10/17/2013, 11:23 AM   LOS: 3 days

## 2013-10-17 NOTE — Progress Notes (Signed)
[facility] Progress Note  Subjective: Says he feels a little better but still spitting up and vomiting up some bile.  Objective: Vital signs in last 24 hours: Temp:  [98.3 F (36.8 C)-98.9 F (37.2 C)] 98.9 F (37.2 C) (09/05 0520) Pulse Rate:  [100-118] 107 (09/05 0520) Resp:  [16-20] 20 (09/05 0520) BP: (148-181)/(55-113) 148/93 mmHg (09/05 0520) SpO2:  [95 %-98 %] 98 % (09/05 0520) Weight change:    PE: In no distress Abdomen soft  Lab Results: Results for orders placed during the hospital encounter of 10/14/13 (from the past 24 hour(s))  GLUCOSE, CAPILLARY     Status: Abnormal   Collection Time    10/16/13  4:43 PM      Result Value Ref Range   Glucose-Capillary 193 (*) 70 - 99 mg/dL  GLUCOSE, CAPILLARY     Status: Abnormal   Collection Time    10/16/13  8:03 PM      Result Value Ref Range   Glucose-Capillary 167 (*) 70 - 99 mg/dL  GLUCOSE, CAPILLARY     Status: Abnormal   Collection Time    10/16/13 11:05 PM      Result Value Ref Range   Glucose-Capillary 160 (*) 70 - 99 mg/dL   Comment 1 Documented in Chart     Comment 2 Notify RN    GLUCOSE, CAPILLARY     Status: Abnormal   Collection Time    10/17/13  4:26 AM      Result Value Ref Range   Glucose-Capillary 145 (*) 70 - 99 mg/dL  COMPREHENSIVE METABOLIC PANEL     Status: Abnormal   Collection Time    10/17/13  5:00 AM      Result Value Ref Range   Sodium 137  137 - 147 mEq/L   Potassium 4.5  3.7 - 5.3 mEq/L   Chloride 97  96 - 112 mEq/L   CO2 21  19 - 32 mEq/L   Glucose, Bld 165 (*) 70 - 99 mg/dL   BUN 9  6 - 23 mg/dL   Creatinine, Ser 0.98  0.50 - 1.35 mg/dL   Calcium 9.1  8.4 - 10.5 mg/dL   Total Protein 7.2  6.0 - 8.3 g/dL   Albumin 3.7  3.5 - 5.2 g/dL   AST 14  0 - 37 U/L   ALT 14  0 - 53 U/L   Alkaline Phosphatase 80  39 - 117 U/L   Total Bilirubin 0.5  0.3 - 1.2 mg/dL   GFR calc non Af Amer >90  >90 mL/min   GFR calc Af Amer >90  >90 mL/min   Anion gap 19 (*) 5 - 15  CBC      Status: None   Collection Time    10/17/13  6:29 AM      Result Value Ref Range   WBC 9.5  4.0 - 10.5 K/uL   RBC 4.95  4.22 - 5.81 MIL/uL   Hemoglobin 13.3  13.0 - 17.0 g/dL   HCT 39.5  39.0 - 52.0 %   MCV 79.8  78.0 - 100.0 fL   MCH 26.9  26.0 - 34.0 pg   MCHC 33.7  30.0 - 36.0 g/dL   RDW 12.8  11.5 - 15.5 %   Platelets 371  150 - 400 K/uL  GLUCOSE, CAPILLARY     Status: Abnormal   Collection Time    10/17/13  8:06 AM      Result Value Ref Range  Glucose-Capillary 147 (*) 70 - 99 mg/dL   Comment 1 Documented in Chart     Comment 2 Notify RN      Studies/Results: No results found.    Assessment: Diabetic gastroparesis.  Plan: Continue current plan. Nothing more to add from my previous note. Will sign off.    GANEM,SALEM F 10/17/2013, 9:53 AM

## 2013-10-18 ENCOUNTER — Encounter (HOSPITAL_COMMUNITY): Payer: Self-pay | Admitting: Radiology

## 2013-10-18 ENCOUNTER — Inpatient Hospital Stay (HOSPITAL_COMMUNITY): Payer: BC Managed Care – PPO

## 2013-10-18 LAB — BASIC METABOLIC PANEL
ANION GAP: 15 (ref 5–15)
BUN: 11 mg/dL (ref 6–23)
CHLORIDE: 102 meq/L (ref 96–112)
CO2: 23 mEq/L (ref 19–32)
CREATININE: 0.95 mg/dL (ref 0.50–1.35)
Calcium: 9.3 mg/dL (ref 8.4–10.5)
GFR calc non Af Amer: >90 mL/min (ref >90)
Glucose, Bld: 125 mg/dL — ABNORMAL HIGH (ref 70–99)
POTASSIUM: 4.1 meq/L (ref 3.7–5.3)
Sodium: 140 mEq/L (ref 137–147)

## 2013-10-18 LAB — GLUCOSE, CAPILLARY
GLUCOSE-CAPILLARY: 96 mg/dL (ref 70–99)
GLUCOSE-CAPILLARY: 159 mg/dL — AB (ref 70–99)
Glucose-Capillary: 120 mg/dL — ABNORMAL HIGH (ref 70–99)
Glucose-Capillary: 136 mg/dL — ABNORMAL HIGH (ref 70–99)

## 2013-10-18 MED ORDER — METOPROLOL TARTRATE 50 MG PO TABS
50.0000 mg | ORAL_TABLET | Freq: Two times a day (BID) | ORAL | Status: DC
  Administered 2013-10-18 – 2013-10-19 (×2): 50 mg via ORAL
  Filled 2013-10-18 (×4): qty 1

## 2013-10-18 MED ORDER — ERYTHROMYCIN BASE 250 MG PO TBEC
250.0000 mg | DELAYED_RELEASE_TABLET | Freq: Four times a day (QID) | ORAL | Status: DC
  Administered 2013-10-18 – 2013-10-19 (×3): 250 mg via ORAL
  Filled 2013-10-18 (×7): qty 1

## 2013-10-18 MED ORDER — AMLODIPINE BESYLATE 10 MG PO TABS
10.0000 mg | ORAL_TABLET | Freq: Every day | ORAL | Status: DC
  Administered 2013-10-18: 10 mg via ORAL
  Filled 2013-10-18 (×2): qty 1

## 2013-10-18 MED ORDER — METOPROLOL TARTRATE 1 MG/ML IV SOLN
7.5000 mg | Freq: Four times a day (QID) | INTRAVENOUS | Status: DC
  Administered 2013-10-18: 7.5 mg via INTRAVENOUS
  Filled 2013-10-18 (×4): qty 10

## 2013-10-18 MED ORDER — IOHEXOL 300 MG/ML  SOLN: 100.0000 mL | Freq: Once | INTRAMUSCULAR | Status: AC | PRN

## 2013-10-18 MED ORDER — OXYCODONE HCL 5 MG PO TABS
5.0000 mg | ORAL_TABLET | ORAL | Status: DC | PRN
  Administered 2013-10-18 – 2013-10-19 (×4): 5 mg via ORAL
  Filled 2013-10-18 (×4): qty 1

## 2013-10-18 MED ORDER — METOCLOPRAMIDE HCL 10 MG PO TABS
10.0000 mg | ORAL_TABLET | Freq: Three times a day (TID) | ORAL | Status: DC
  Administered 2013-10-18 – 2013-10-19 (×3): 10 mg via ORAL
  Filled 2013-10-18 (×6): qty 1

## 2013-10-18 MED ORDER — IOHEXOL 300 MG/ML  SOLN
50.0000 mL | Freq: Once | INTRAMUSCULAR | Status: AC | PRN
  Administered 2013-10-18: 50 mL via ORAL

## 2013-10-18 NOTE — Progress Notes (Signed)
PROGRESS NOTE  Duane [name] [id number] DOB: 11-09-1975 DOA: 10/14/2013 PCP: [name], MD  Assessment/Plan: Intractable/recurrent nausea and vomiting  -Secondary to diabetic gastroparesis  -Gastroparesis has been confirmed by gastric emptying study on 08/18/2013  -continue clear liquids today  -Patient remains very focused upon receiving IV Dilaudid--I informed the patient that opioids will make his gastroparesis worse  -I informed patient that I will not be changing his IV morphine to Dilaudid  -although the patient states that he continues to have emesis, this has not been witnessed by ancillary staff  -RN reports the patient has had numerous episodes of hypersalivation without actual emesis  -Continue IV Reglan and PPI  -Continue IV erythromycin  -Anti-emetics  -Minimize opioids -TSH--1.020  -A.m. Cortisol--pending  -Abdominal x-ray--nonobstructive  -I have discussed and offered the patient to try to arrange a transfer to [facility] or other tertiary care center--He continues to refuse any transfer each day  -requested GI consult  Abdominal pain  -Patient has opioid seeking behavior  -10/18/13 CT abdomen and pelvis -I informed patient that if CT abd is unremarkable, I will likely switch him to oral opioids -Patient received 14 mg of IV morphine on 10/15/2013,10 mg IV morphine on 10/16/2013, 12 mg on 10/17/13 -I discussed with the patient that increasing opioids can make his gastroparesis and vomiting worse  -endorsed pain out of proportion with clinical exam--no pain when pt is distracted  -Hepatic enzymes and lipase are normal  AKI  -Secondary to volume depletion  -Improved with IV fluids  Diabetes mellitus type 1, uncontrolled with complications of neuropathy and gastroparesis  -09/21/2013 hemoglobin A1c 9.3  -Question compliance with medication regimen  -adjust lantus to 17u at bedtime  -CBGs controlled  HTN  -While the patient is npo and vomiting, start the  patient on IV Lopressor until he is able to tolerate oral intake  -increase IV lopressor to 7.5mg  q 6 hrs  Family Communication: Pt at beside  Disposition Plan: Home when medically stable       Procedures/Studies: Dg Abd 1 View  10/05/2013   CLINICAL DATA:  Epigastric pain, constipation.  EXAM: ABDOMEN - 1 VIEW  COMPARISON:  September 25, 2013.  FINDINGS: The bowel gas pattern is normal. Status post cholecystectomy. Mild amount of stool burden is noted which is slightly decreased compared to prior exam. No radio-opaque calculi or other significant radiographic abnormality are seen.  IMPRESSION: Decreased amount of stool burden seen within the colon. No evidence of bowel obstruction or ileus is noted.   Electronically Signed   By: Sabino Dick M.D.   On: 10/05/2013 10:26   Dg Abd 2 Views  10/15/2013   CLINICAL DATA:  Abdominal pain, nausea, vomiting, history hypertension, diabetes, gastroparesis  EXAM: ABDOMEN - 2 VIEW  COMPARISON:  10/05/2013  FINDINGS: Lung bases grossly clear.  Nonobstructive bowel gas pattern.  Scattered stool throughout ascending and proximal transverse colon.  No bowel dilatation, bowel wall thickening or free air.  Bones normal appearance.  No definite urinary tract calcification.  IMPRESSION: Normal exam.   Electronically Signed   By: Lavonia Dana M.D.   On: 10/15/2013 10:34   Dg Abd 2 Views  09/25/2013   CLINICAL DATA:  Nausea and vomiting  EXAM: ABDOMEN - 2 VIEW  COMPARISON:  09/22/2013  FINDINGS: Scattered large and small bowel gas is noted. Fecal material is noted throughout the colon without definitive obstructive change. No abnormal mass or abnormal calcifications are seen.  No free air is noted.  IMPRESSION: No acute abnormality seen. A moderate to severe stool burden is noted but stable.   Electronically Signed   By: Inez Catalina M.D.   On: 09/25/2013 14:35   Dg Abd Acute W/chest  09/22/2013   CLINICAL DATA:  Nausea, vomiting and upper abdominal pain.  EXAM: ACUTE  ABDOMEN SERIES (ABDOMEN 2 VIEW & CHEST 1 VIEW)  COMPARISON:  Chest and abdominal radiographs performed 08/17/2013  FINDINGS: The lungs are well-aerated and clear. There is no evidence of focal opacification, pleural effusion or pneumothorax. The cardiomediastinal silhouette is within normal limits.  The visualized bowel gas pattern is unremarkable. Scattered stool and air are seen within the colon; there is no evidence of small bowel dilatation to suggest obstruction. No free intra-abdominal air is identified on the provided upright view. Clips are noted within the right upper quadrant, reflecting prior cholecystectomy.  No acute osseous abnormalities are seen; the sacroiliac joints are unremarkable in appearance.  IMPRESSION: 1. Unremarkable bowel gas pattern; no free intra-abdominal air seen. Moderate amount of stool noted in the colon. 2. No acute cardiopulmonary process identified.   Electronically Signed   By: Garald Balding M.D.   On: 09/22/2013 01:22         Subjective: Patient continues to complain of upper abdominal pain. Denies any chest pain, shortness breath, diarrhea. He continues to have nausea and endorsed one episode of emesis in the last 24 hours although this has not been witnessed by the nursing staff. Denies any fevers, chills, headache, dizziness.  Objective: Filed Vitals:   10/17/13 2132 10/17/13 2300 10/17/13 2346 10/18/13 0500  BP: 162/88 119/63  162/101  Pulse: 105 122 110 117  Temp:  99.6 F (37.6 C)  98.5 F (36.9 C)  TempSrc:    Oral  Resp:  18  16  Height:      Weight:      SpO2:  97%  99%    Intake/Output Summary (Last 24 hours) at 10/18/13 1011 Last data filed at 10/18/13 0500  Gross per 24 hour  Intake   4320 ml  Output    654 ml  Net   3666 ml   Weight change:  Exam:   General:  Pt is alert, follows commands appropriately, not in acute distress  HEENT: No icterus, No thrush,[place]/AT  Cardiovascular: RRR, S1/S2, no rubs, no gallops  Respiratory:  CTA bilaterally, no wheezing, no crackles, no rhonchi  Abdomen: Soft/+BS, epigastric tenderness without any guarding, rebound, or peritoneal signs, non distended, no guarding  Extremities: No edema, No lymphangitis, No petechiae, No rashes, no synovitis  Data Reviewed: Basic Metabolic Panel:  Recent Labs Lab 10/14/13 1102 10/15/13 0525 10/16/13 0655 10/17/13 0500 10/18/13 0748  NA 139 143 137 137 140  K 4.5 4.1 3.8 4.5 4.1  CL 97 103 98 97 102  CO2 23 26 26 21 23   GLUCOSE 353* 165* 151* 165* 125*  BUN 12 9 8 9 11   CREATININE 1.38* 1.01 1.04 0.98 0.95  CALCIUM 9.8 9.3 9.0 9.1 9.3   Liver Function Tests:  Recent Labs Lab 10/14/13 1102 10/15/13 0525 10/17/13 0500  AST 13 11 14   ALT 16 14 14   ALKPHOS 96 80 80  BILITOT 0.4 0.4 0.5  PROT 7.7 7.3 7.2  ALBUMIN 4.0 3.6 3.7    Recent Labs Lab 10/14/13 1102  LIPASE 20   No results found for this basename: AMMONIA,  in the last 168 hours CBC:  Recent Labs Lab  10/14/13 1102 10/15/13 0525 10/16/13 0655 10/17/13 0629  WBC 9.8 10.1 8.2 9.5  NEUTROABS 8.0*  --   --   --   HGB 12.9* 12.4* 12.6* 13.3  HCT 38.6* 37.2* 38.5* 39.5  MCV 79.3 79.8 81.6 79.8  PLT 431* 391 352 371   Cardiac Enzymes: No results found for this basename: CKTOTAL, CKMB, CKMBINDEX, TROPONINI,  in the last 168 hours BNP: No components found with this basename: POCBNP,  CBG:  Recent Labs Lab 10/17/13 1628 10/17/13 2010 10/17/13 2337 10/18/13 0500 10/18/13 0743  GLUCAP 160* 157* 153* 96 120*    No results found for this or any previous visit (from the past 240 hour(s)).   Scheduled Meds: . erythromycin  250 mg Intravenous 4 times per day  . heparin  5,000 Units Subcutaneous 3 times per day  . insulin aspart  0-15 Units Subcutaneous Q4H  . insulin glargine  17 Units Subcutaneous QHS  . metoCLOPramide (REGLAN) injection  10 mg Intravenous 3 times per day  . metoprolol  7.5 mg Intravenous 4 times per day  . pantoprazole (PROTONIX) IV   40 mg Intravenous Q24H   Continuous Infusions: . sodium chloride 125 mL/hr at 10/18/13 0412     Duane Wehling, DO  [facility] Pager [phone]  If 7PM-7AM, please contact night-coverage [email or web address] Password [email or web address] 10/18/2013, 10:11 AM   LOS: 4 days

## 2013-10-19 ENCOUNTER — Inpatient Hospital Stay (HOSPITAL_COMMUNITY): Payer: BC Managed Care – PPO

## 2013-10-19 LAB — COMPREHENSIVE METABOLIC PANEL
ALK PHOS: 81 U/L (ref 39–117)
ALT: 10 U/L (ref 0–53)
AST: 10 U/L (ref 0–37)
Albumin: 3.6 g/dL (ref 3.5–5.2)
Anion gap: 15 (ref 5–15)
BILIRUBIN TOTAL: 0.5 mg/dL (ref 0.3–1.2)
BUN: 12 mg/dL (ref 6–23)
CHLORIDE: 101 meq/L (ref 96–112)
CO2: 26 mEq/L (ref 19–32)
Calcium: 9.1 mg/dL (ref 8.4–10.5)
Creatinine, Ser: 1.06 mg/dL (ref 0.50–1.35)
GFR, EST NON AFRICAN AMERICAN: 88 mL/min — AB (ref >90)
GLUCOSE: 143 mg/dL — AB (ref 70–99)
Potassium: 3.5 mEq/L — ABNORMAL LOW (ref 3.7–5.3)
SODIUM: 142 meq/L (ref 137–147)
Total Protein: 7.2 g/dL (ref 6.0–8.3)

## 2013-10-19 LAB — CBC
HCT: 38.5 % — ABNORMAL LOW (ref 39.0–52.0)
Hemoglobin: 12.6 g/dL — ABNORMAL LOW (ref 13.0–17.0)
MCH: 26.4 pg (ref 26.0–34.0)
MCHC: 32.7 g/dL (ref 30.0–36.0)
MCV: 80.5 fL (ref 78.0–100.0)
PLATELETS: 294 10*3/uL (ref 150–400)
RBC: 4.78 MIL/uL (ref 4.22–5.81)
RDW: 12.9 % (ref 11.5–15.5)
WBC: 10 10*3/uL (ref 4.0–10.5)

## 2013-10-19 LAB — GLUCOSE, CAPILLARY
GLUCOSE-CAPILLARY: 177 mg/dL — AB (ref 70–99)
GLUCOSE-CAPILLARY: 121 mg/dL — AB (ref 70–99)
Glucose-Capillary: 133 mg/dL — ABNORMAL HIGH (ref 70–99)
Glucose-Capillary: 142 mg/dL — ABNORMAL HIGH (ref 70–99)
Glucose-Capillary: 157 mg/dL — ABNORMAL HIGH (ref 70–99)

## 2013-10-19 MED ORDER — IOHEXOL 300 MG/ML  SOLN: 50.0000 mL | Freq: Once | INTRAMUSCULAR | Status: DC | PRN

## 2013-10-19 MED ORDER — MORPHINE SULFATE 2 MG/ML IJ SOLN
2.0000 mg | Freq: Once | INTRAMUSCULAR | Status: AC
  Administered 2013-10-19: 2 mg via INTRAVENOUS
  Filled 2013-10-19: qty 1

## 2013-10-19 MED ORDER — SODIUM CHLORIDE 0.9 % IJ SOLN: 10.0000 mL | INTRAMUSCULAR | Status: DC | PRN

## 2013-10-19 NOTE — Progress Notes (Signed)
MD called about reason patient needing PICC line.  PICC team RN nurse, Florencia Reasons asking patients RN if they can insert a midline IV.   MD called and stated that a midline IV access would be fine as well.

## 2013-10-19 NOTE — Progress Notes (Addendum)
RN gave report to receiving RN Almeta Monas, at [facility] on the 5th floor of [facility]. All questions answered.   Patient transported via [place] from [facility] to [facility]. RN gave [facility] report about patient status.  [facility] transferred patient to [facility].   Patient being transferred to another hospital, therefore, education goals were not met.

## 2013-10-19 NOTE — Progress Notes (Signed)
Peripherally Inserted Central Catheter/Midline Placement  The IV Nurse has discussed with the patient and/or persons authorized to consent for the patient, the purpose of this procedure and the potential benefits and risks involved with this procedure.  The benefits include less needle sticks, lab draws from the catheter and patient may be discharged home with the catheter.  Risks include, but not limited to, infection, bleeding, blood clot (thrombus formation), and puncture of an artery; nerve damage and irregular heat beat.  Alternatives to this procedure were also discussed.  PICC/Midline Placement Documentation  PICC / Midline Single Lumen 10/19/13 Midline Right Basilic 14 cm 0 cm (Active)  Indication for Insertion or Continuance of Line Poor Vasculature-patient has had multiple peripheral attempts or PIVs lasting less than 24 hours 10/19/2013  9:00 AM  Exposed Catheter (cm) 0 cm 10/19/2013  9:00 AM  Dressing Change Due 10/26/13 10/19/2013  9:00 AM       Jule Economy Horton 10/19/2013, 9:16 AM

## 2013-10-19 NOTE — Progress Notes (Signed)
CARE MANAGEMENT NOTE 10/19/2013  Patient:  Duane [name], Duane [name]   Account Number:  [id number]  Date Initiated:  10/19/2013  Documentation initiated by:  DAVIS,RHONDA  Subjective/Objective Assessment:   frist this rn has seen this patient  paptient to be transferred to [facility] in [place] today for furture care. complicated patient with diabetes/gastroporesis-DR K.L.Derrill Kay     Action/Plan:   will be transferred to [facility] via [facility]   Anticipated DC Date:  10/19/2013   Anticipated DC Plan:  ACUTE TO ACUTE TRANS  In-house referral  Clinical Social Worker      DC Planning Services  CM consult      [facility] Choice  NA   Choice offered to / List presented to:  NA   DME arranged  NA      DME agency  NA     [place] arranged  NA      [place] agency  NA   Status of service:  Completed, signed off Medicare Important Message given?   (If response is "NO", the following Medicare IM given date fields will be blank) Date Medicare IM given:   Medicare IM given by:   Date Additional Medicare IM given:   Additional Medicare IM given by:    Discharge Disposition:  ACUTE TO ACUTE TRANS  Per UR Regulation:  Reviewed for med. necessity/level of care/duration of stay  If discussed at [place] of Stay Meetings, dates discussed:    Comments:  Suanne Marker Davis,RN,BSN,CCM

## 2013-10-19 NOTE — Progress Notes (Signed)
MD paged at 1050. Patient requesting IV pain medication. MD gave order for 2mg  Iv morphine for once dose.   Patient was given medication at 1103 for a pain level of 8 out of 10.  Will reassess pain in 30 minutes.

## 2013-10-19 NOTE — Discharge Summary (Signed)
Physician Discharge Summary  Duane [name] [id number] DOB: Nov 08, 1975 DOA: 10/14/2013  PCP: [name], MD  Admit date: 10/14/2013 Discharge date: 10/19/2013  Recommendations for Outpatient Follow-up:  PATIENT IS BEING TRANSFERRED TO [facility]    Discharge Diagnoses:  Principal Problem:   Intractable nausea and vomiting Active Problems:   IDDM (insulin dependent diabetes mellitus)   Diabetic gastroparesis   Diabetic neuropathy   Gastroparesis   Acute kidney injury   Hyperglycemia due to type 1 diabetes mellitus Intractable/recurrent nausea and vomiting  -Secondary to diabetic gastroparesis  -Gastroparesis has been confirmed by gastric emptying study on 08/18/2013  -have tried clear liquids but pt continues to have vomiting/spitting -Patient remains very focused upon receiving IV Dilaudid--I informed the patient that opioids will make his gastroparesis worse which has been re-enforced by GI consultants -I informed patient that I will not be changing his IV morphine to Dilaudid  -although the patient states that he continues to have emesis,it has been difficult for nursing staff to assess as he does not notify them  -RN reports the patient has had numerous episodes of hypersalivation  -Continue IV Reglan and PPI  -Continue IV erythromycin  -Anti-emetics, IVF -Minimize opioids  -TSH--1.020  -A.m. Cortisol--25.3 -Abdominal x-ray--nonobstructive  -I have discussed and offered the patient to try to arrange a transfer to [facility] he has refused, he agreed today (10/19/13) -appreciate GI, Dr. Penelope Coop input -10/19/13--spoke with GI, Dr. Steward Drone at [facility] who agreed to accept pt in transfer. -10/19/13--spoke with hospitalist Dr. Caryl Comes who agreed to accept patient in transfer to [facility] Abdominal pain  -Patient has opioid seeking behavior  -10/18/13 CT abdomen and pelvis was ordered but pt lost IV  access -cancel CT at this time as pt is getting transferred to [facility] -Patient received 14 mg of IV morphine on 10/15/2013,10 mg IV morphine on 10/16/2013, 12 mg on 10/17/13  -I discussed with the patient that increasing opioids can make his gastroparesis and vomiting worse  -Hepatic enzymes and lipase are normal  AKI  -Secondary to volume depletion  -Improved with IV fluids  Diabetes mellitus type 1, uncontrolled with complications of neuropathy and gastroparesis  -09/21/2013 hemoglobin A1c 9.3  -Question compliance with medication regimen  -adjust lantus to 17u at bedtime  -CBGs controlled  HTN  -While the patient is npo and vomiting, start the patient on IV Lopressor until he is able to tolerate oral intake  -increase IV lopressor to 7.5mg  q 6 hrs  IV Access -pt lost IV access 10/18/13 evening--tried to switch to po meds which pt states he has vomited up -midline IV/PICC line has been ordered--hopeful to get it placed prior to transfer  Family Communication: Pt at beside  Disposition Plan: Home when medically stable   Discharge Condition: STABLE  Disposition:  TRANSFER TO [facility] Read[facility] from Last 3 Encounters:  10/15/13 124.739 kg (275 lb)  10/08/13 123.741 kg (272 lb 12.8 oz)  09/22/13 124.6 kg (274 lb 11.1 oz)    History of present illness:  38 year old male with a history of hypertension and poorly controlled diabetes mellitus type 1 with complications of gastroparesis and neuropathy presents with recurrent and intractable nausea and vomiting that started on the day of admission. The patient has had numerous admissions for the same complaints in the past 5 months. This is his sixth admission since April 2015. The patient has had a gastric emptying study on 08/18/2013 which  revealed 100% retention of food products at 120 minutes. He had an EGD performed on 06/11/2013 which showed a hiatus hernia, but was normal otherwise. A  flexible sigmoidoscopy was performed on 06/11/2013 which was normal up to the splenic flexure. Review of the medical record also reveals that the patient has a history of opioid seeking behavior. The patient has had multiple GI consults. They have recommended evaluation at a tertiary care center. The patient endorses compliance with all his medications including Reglan, erythromycin, Protonix, and Carafate at home. At the time of admission, the patient was noted to be in acute renal failure with a serum creatinine of 1.38. This has improved with IV hydration. His hepatic enzymes and lipase were unremarkable. Abdominal x-rays have been negative for obstruction. The patient was made n.p.o. and started on intravenous fluids, intravenous antibiotics and opioids as well as IV erythromycin and metoclopramide. Multiple attempts have made to transition the patient to clear liquids and advancement of his diet without success. Each time, the patient would complain of abdominal pain hypersalivation and emesis. Gastroenterology was consulted. Dr. Penelope Coop agree with present management and did not have additional recommendations. He recommended transfer to a tertiary care center. During the first several days of the admission, the patient refused transfer to a different [facility]. However on 10/19/2013 the patient was agreeable. Regarding his diabetes mellitus, the patient's blood sugars have been well controlled on Lantus 17 units at bedtime. He CBGs have been checked every 4 hours. Regarding his hypertension, his metoprolol has been changed to IV with fair control. The intention is to ultimately transition him back to his oral therapy, but the patient has not been able to tolerate it.    Consultants: GI--Dr. Anson Fret  Discharge Exam: Filed Vitals:   10/19/13 0536  BP: 158/95  Pulse: 102  Temp: 98.7 F (37.1 C)  Resp: 18   Filed Vitals:   10/18/13 1205 10/18/13 1424 10/18/13 2131 10/19/13 0536  BP:  169/94 177/107 139/86 158/95  Pulse: 113 110 117 102  Temp:  99 F (37.2 C) 98.6 F (37 C) 98.7 F (37.1 C)  TempSrc:  Oral Oral Oral  Resp:  18 18 18   Height:      Weight:      SpO2:  99% 99% 98%   General: A&O x 3, NAD, pleasant, cooperative Cardiovascular: RRR, no rub, no gallop, no S3 Respiratory: CTAB, no wheeze, no rhonchi Abdomen:soft, epigastric tenderness without rebound, nondistended, positive bowel sounds Extremities: No edema, No lymphangitis, no petechiae  Discharge Instructions     Medication List         acetaminophen 500 MG tablet  Commonly known as:  TYLENOL  Take 500 mg by mouth every 6 (six) hours as needed for mild pain.     amLODipine 10 MG tablet  Commonly known as:  NORVASC  Take 1 tablet (10 mg total) by mouth every morning.     cloNIDine 0.1 MG tablet  Commonly known as:  CATAPRES  Take 1 tablet (0.1 mg total) by mouth 2 (two) times daily.     erythromycin ethylsuccinate 200 MG/5ML suspension  Commonly known as:  EES  Take 10 mLs (400 mg total) by mouth every 8 (eight) hours.     gabapentin 300 MG capsule  Commonly known as:  NEURONTIN  Take 1 capsule (300 mg total) by mouth at bedtime.     glucose blood test strip  Commonly known as:  ONE TOUCH ULTRA TEST  Use as instructed  to check blood sugar once a day dx code 250.00     HYDROcodone-acetaminophen 5-325 MG per tablet  Commonly known as:  NORCO/VICODIN  Take 1 tablet by mouth every 8 (eight) hours as needed for moderate pain.     insulin aspart 100 UNIT/ML injection  Commonly known as:  novoLOG  Inject 9-12 Units into the skin 3 (three) times daily before meals. SSI     insulin glargine 100 UNIT/ML injection  Commonly known as:  LANTUS  Inject 0.1 mLs (10 Units total) into the skin at bedtime.     metoCLOPramide 10 MG tablet  Commonly known as:  REGLAN  Take 10 mg by mouth 4 (four) times daily.     metoprolol 50 MG tablet  Commonly known as:  LOPRESSOR  Take 50 mg by mouth  2 (two) times daily.     ONETOUCH DELICA LANCETS FINE Misc  Use one per day dx code 250.00     pantoprazole 40 MG tablet  Commonly known as:  PROTONIX  Take 1 tablet (40 mg total) by mouth every morning.     polyethylene glycol packet  Commonly known as:  MIRALAX / GLYCOLAX  Take 17 g by mouth 2 (two) times daily.     promethazine 25 MG tablet  Commonly known as:  PHENERGAN  Take 1 tablet (25 mg total) by mouth every 8 (eight) hours as needed for nausea or vomiting.     promethazine 25 MG suppository  Commonly known as:  PHENERGAN  Place 1 suppository (25 mg total) rectally every 6 (six) hours as needed for nausea or vomiting.     sucralfate 1 G tablet  Commonly known as:  CARAFATE  Take 1 g by mouth 4 (four) times daily -  before meals and at bedtime.     traMADol 50 MG tablet  Commonly known as:  ULTRAM  Take 50 mg by mouth every 6 (six) hours as needed for moderate pain.         The results of significant diagnostics from this hospitalization (including imaging, microbiology, ancillary and laboratory) are listed below for reference.    Significant Diagnostic Studies: Dg Abd 1 View  10/05/2013   CLINICAL DATA:  Epigastric pain, constipation.  EXAM: ABDOMEN - 1 VIEW  COMPARISON:  September 25, 2013.  FINDINGS: The bowel gas pattern is normal. Status post cholecystectomy. Mild amount of stool burden is noted which is slightly decreased compared to prior exam. No radio-opaque calculi or other significant radiographic abnormality are seen.  IMPRESSION: Decreased amount of stool burden seen within the colon. No evidence of bowel obstruction or ileus is noted.   Electronically Signed   By: Sabino Dick M.D.   On: 10/05/2013 10:26   Dg Abd 2 Views  10/15/2013   CLINICAL DATA:  Abdominal pain, nausea, vomiting, history hypertension, diabetes, gastroparesis  EXAM: ABDOMEN - 2 VIEW  COMPARISON:  10/05/2013  FINDINGS: Lung bases grossly clear.  Nonobstructive bowel gas pattern.  Scattered  stool throughout ascending and proximal transverse colon.  No bowel dilatation, bowel wall thickening or free air.  Bones normal appearance.  No definite urinary tract calcification.  IMPRESSION: Normal exam.   Electronically Signed   By: Lavonia Dana M.D.   On: 10/15/2013 10:34   Dg Abd 2 Views  09/25/2013   CLINICAL DATA:  Nausea and vomiting  EXAM: ABDOMEN - 2 VIEW  COMPARISON:  09/22/2013  FINDINGS: Scattered large and small bowel gas is noted. Fecal material is noted  throughout the colon without definitive obstructive change. No abnormal mass or abnormal calcifications are seen. No free air is noted.  IMPRESSION: No acute abnormality seen. A moderate to severe stool burden is noted but stable.   Electronically Signed   By: Inez Catalina M.D.   On: 09/25/2013 14:35   Dg Abd Acute W/chest  09/22/2013   CLINICAL DATA:  Nausea, vomiting and upper abdominal pain.  EXAM: ACUTE ABDOMEN SERIES (ABDOMEN 2 VIEW & CHEST 1 VIEW)  COMPARISON:  Chest and abdominal radiographs performed 08/17/2013  FINDINGS: The lungs are well-aerated and clear. There is no evidence of focal opacification, pleural effusion or pneumothorax. The cardiomediastinal silhouette is within normal limits.  The visualized bowel gas pattern is unremarkable. Scattered stool and air are seen within the colon; there is no evidence of small bowel dilatation to suggest obstruction. No free intra-abdominal air is identified on the provided upright view. Clips are noted within the right upper quadrant, reflecting prior cholecystectomy.  No acute osseous abnormalities are seen; the sacroiliac joints are unremarkable in appearance.  IMPRESSION: 1. Unremarkable bowel gas pattern; no free intra-abdominal air seen. Moderate amount of stool noted in the colon. 2. No acute cardiopulmonary process identified.   Electronically Signed   By: Garald Balding M.D.   On: 09/22/2013 01:22     Microbiology: No results found for this or any previous visit (from the  past 240 hour(s)).   Labs: Basic Metabolic Panel:  Recent Labs Lab 10/15/13 0525 10/16/13 0655 10/17/13 0500 10/18/13 0748 10/19/13 0530  NA 143 137 137 140 142  K 4.1 3.8 4.5 4.1 3.5*  CL 103 98 97 102 101  CO2 26 26 21 23 26   GLUCOSE 165* 151* 165* 125* 143*  BUN 9 8 9 11 12   CREATININE 1.01 1.04 0.98 0.95 1.06  CALCIUM 9.3 9.0 9.1 9.3 9.1   Liver Function Tests:  Recent Labs Lab 10/14/13 1102 10/15/13 0525 10/17/13 0500 10/19/13 0530  AST 13 11 14 10   ALT 16 14 14 10   ALKPHOS 96 80 80 81  BILITOT 0.4 0.4 0.5 0.5  PROT 7.7 7.3 7.2 7.2  ALBUMIN 4.0 3.6 3.7 3.6    Recent Labs Lab 10/14/13 1102  LIPASE 20   No results found for this basename: AMMONIA,  in the last 168 hours CBC:  Recent Labs Lab 10/14/13 1102 10/15/13 0525 10/16/13 0655 10/17/13 0629 10/19/13 0530  WBC 9.8 10.1 8.2 9.5 10.0  NEUTROABS 8.0*  --   --   --   --   HGB 12.9* 12.4* 12.6* 13.3 12.6*  HCT 38.6* 37.2* 38.5* 39.5 38.5*  MCV 79.3 79.8 81.6 79.8 80.5  PLT 431* 391 352 371 294   Cardiac Enzymes: No results found for this basename: CKTOTAL, CKMB, CKMBINDEX, TROPONINI,  in the last 168 hours BNP: No components found with this basename: POCBNP,  CBG:  Recent Labs Lab 10/18/13 1645 10/18/13 1959 10/19/13 0004 10/19/13 0414 10/19/13 0726  GLUCAP 159* 177* 157* 121* 133*    Time coordinating discharge:  Greater than 30 minutes  Signed:  Adolfo Granieri, DO [facility] Pager: [phone] 10/19/2013, 9:11 AM

## 2013-10-21 ENCOUNTER — Encounter: Payer: Self-pay | Admitting: Internal Medicine

## 2013-10-22 DIAGNOSIS — Z794 Long term (current) use of insulin: Secondary | ICD-10-CM | POA: Insufficient documentation

## 2013-10-22 DIAGNOSIS — E1149 Type 2 diabetes mellitus with other diabetic neurological complication: Secondary | ICD-10-CM | POA: Insufficient documentation

## 2013-10-22 DIAGNOSIS — K3184 Gastroparesis: Secondary | ICD-10-CM | POA: Insufficient documentation

## 2013-10-22 DIAGNOSIS — E1142 Type 2 diabetes mellitus with diabetic polyneuropathy: Secondary | ICD-10-CM | POA: Insufficient documentation

## 2013-10-22 DIAGNOSIS — I10 Essential (primary) hypertension: Secondary | ICD-10-CM | POA: Insufficient documentation

## 2013-10-22 DIAGNOSIS — Z79899 Other long term (current) drug therapy: Secondary | ICD-10-CM | POA: Insufficient documentation

## 2013-10-22 DIAGNOSIS — E669 Obesity, unspecified: Secondary | ICD-10-CM | POA: Insufficient documentation

## 2013-10-22 DIAGNOSIS — R112 Nausea with vomiting, unspecified: Secondary | ICD-10-CM | POA: Diagnosis present

## 2013-10-22 NOTE — Progress Notes (Signed)
Utilization review completed. Discharge summary sent to payer through [facility]

## 2013-10-23 ENCOUNTER — Emergency Department (HOSPITAL_COMMUNITY)
Admission: EM | Admit: 2013-10-23 | Discharge: 2013-10-23 | Disposition: A | Discharge status: Home / Self Care | Payer: BC Managed Care – PPO | Attending: Emergency Medicine | Admitting: Emergency Medicine

## 2013-10-23 ENCOUNTER — Encounter (HOSPITAL_COMMUNITY): Payer: Self-pay | Admitting: Emergency Medicine

## 2013-10-23 DIAGNOSIS — K3184 Gastroparesis: Secondary | ICD-10-CM

## 2013-10-23 LAB — CBC WITH DIFFERENTIAL/PLATELET
Basophils Absolute: 0 10*3/uL (ref 0.0–0.1)
Basophils Relative: 0 % (ref 0–1)
Eosinophils Absolute: 0 10*3/uL (ref 0.0–0.7)
Eosinophils Relative: 0 % (ref 0–5)
HEMATOCRIT: 34.7 % — AB (ref 39.0–52.0)
HEMOGLOBIN: 11.5 g/dL — AB (ref 13.0–17.0)
LYMPHS ABS: 1.6 10*3/uL (ref 0.7–4.0)
Lymphocytes Relative: 20 % (ref 12–46)
MCH: 26.6 pg (ref 26.0–34.0)
MCHC: 33.1 g/dL (ref 30.0–36.0)
MCV: 80.1 fL (ref 78.0–100.0)
MONO ABS: 0.5 10*3/uL (ref 0.1–1.0)
MONOS PCT: 7 % (ref 3–12)
NEUTROS ABS: 5.5 10*3/uL (ref 1.7–7.7)
Neutrophils Relative %: 73 % (ref 43–77)
Platelets: 243 10*3/uL (ref 150–400)
RBC: 4.33 MIL/uL (ref 4.22–5.81)
RDW: 13 % (ref 11.5–15.5)
WBC: 7.6 10*3/uL (ref 4.0–10.5)

## 2013-10-23 LAB — COMPREHENSIVE METABOLIC PANEL
ALBUMIN: 3.2 g/dL — AB (ref 3.5–5.2)
ALT: 11 U/L (ref 0–53)
AST: 12 U/L (ref 0–37)
Alkaline Phosphatase: 68 U/L (ref 39–117)
Anion gap: 14 (ref 5–15)
BUN: 6 mg/dL (ref 6–23)
CALCIUM: 8.8 mg/dL (ref 8.4–10.5)
CHLORIDE: 101 meq/L (ref 96–112)
CO2: 25 mEq/L (ref 19–32)
CREATININE: 0.94 mg/dL (ref 0.50–1.35)
GFR calc Af Amer: >90 mL/min (ref >90)
GFR calc non Af Amer: >90 mL/min (ref >90)
Glucose, Bld: 183 mg/dL — ABNORMAL HIGH (ref 70–99)
Potassium: 3.9 mEq/L (ref 3.7–5.3)
Sodium: 140 mEq/L (ref 137–147)
TOTAL PROTEIN: 6.6 g/dL (ref 6.0–8.3)
Total Bilirubin: 0.4 mg/dL (ref 0.3–1.2)

## 2013-10-23 LAB — I-STAT CG4 LACTIC ACID, ED: LACTIC ACID, VENOUS: 0.82 mmol/L (ref 0.5–2.2)

## 2013-10-23 LAB — CBG MONITORING, ED: GLUCOSE-CAPILLARY: 218 mg/dL — AB (ref 70–99)

## 2013-10-23 MED ORDER — LORAZEPAM 2 MG/ML IJ SOLN
1.0000 mg | Freq: Once | INTRAMUSCULAR | Status: AC
  Administered 2013-10-23: 1 mg via INTRAVENOUS
  Filled 2013-10-23: qty 1

## 2013-10-23 MED ORDER — PROMETHAZINE HCL 25 MG/ML IJ SOLN
25.0000 mg | Freq: Once | INTRAMUSCULAR | Status: AC
  Administered 2013-10-23: 25 mg via INTRAVENOUS
  Filled 2013-10-23: qty 1

## 2013-10-23 MED ORDER — SODIUM CHLORIDE 0.9 % IV BOLUS (SEPSIS)
1000.0000 mL | INTRAVENOUS | Status: AC
  Administered 2013-10-23: 1000 mL via INTRAVENOUS

## 2013-10-23 MED ORDER — SODIUM CHLORIDE 0.9 % IV BOLUS (SEPSIS)
2000.0000 mL | Freq: Once | INTRAVENOUS | Status: AC
  Administered 2013-10-23: 1000 mL via INTRAVENOUS

## 2013-10-23 MED ORDER — LORAZEPAM 1 MG PO TABS: 1.0000 mg | ORAL_TABLET | Freq: Three times a day (TID) | ORAL | Status: DC | PRN

## 2013-10-23 MED ORDER — DICYCLOMINE HCL 10 MG/ML IM SOLN
20.0000 mg | Freq: Once | INTRAMUSCULAR | Status: AC
  Administered 2013-10-23: 20 mg via INTRAMUSCULAR
  Filled 2013-10-23: qty 2

## 2013-10-23 MED ORDER — METOCLOPRAMIDE HCL 5 MG/ML IJ SOLN
10.0000 mg | Freq: Once | INTRAMUSCULAR | Status: AC
  Administered 2013-10-23: 10 mg via INTRAVENOUS
  Filled 2013-10-23: qty 2

## 2013-10-23 MED ORDER — DIPHENHYDRAMINE HCL 50 MG/ML IJ SOLN
50.0000 mg | Freq: Once | INTRAMUSCULAR | Status: AC
  Administered 2013-10-23: 50 mg via INTRAVENOUS
  Filled 2013-10-23: qty 1

## 2013-10-23 MED ORDER — ONDANSETRON HCL 4 MG/2ML IJ SOLN
4.0000 mg | Freq: Once | INTRAMUSCULAR | Status: AC
  Administered 2013-10-23: 4 mg via INTRAVENOUS
  Filled 2013-10-23: qty 2

## 2013-10-23 NOTE — ED Notes (Signed)
RN unable to get IV access, 2nd RN to attempt [name] IV.

## 2013-10-23 NOTE — ED Notes (Signed)
ELEVATED BP- PT REPORTS TOOK BP LAST AT [facility] YESTERDAY YET NOT SINCE THEN.

## 2013-10-23 NOTE — Discharge Instructions (Signed)

## 2013-10-23 NOTE — ED Notes (Signed)
MD at bedside. 

## 2013-10-23 NOTE — ED Provider Notes (Signed)
7:49 AM Accepted care from Dr. Sharol Given. 62M here w/ gastroparesis, nausea, vomiting. Recent d/c from [facility] and here. Pt s/p 2L IVF. Will get labs and re-evaluate.   Case discussed w/ Dr. Delos Haring (hospitalist at [facility]) he states that they have nothing to auscultation and does not think that he warrants transfer. I discussed the case with Dr. Grandville Silos (hospitalist here) view also states that we have nothing to offer him. The patient's labs are noncontributory and he continues to appear well on exam. He has some spitting but no frank emesis. Will recommend outpatient treatment and return for any worsening.  11:13 AM:  I have discussed the diagnosis/risks/treatment options with the patient and believe the pt to be eligible for discharge home to follow-up with GI as scheduled. We also discussed returning to the ED immediately if new or worsening sx occur. We discussed the sx which are most concerning (e.g., inability to tolerate po, inc vomiting, fever, worsening pain) that necessitate immediate return. Medications administered to the patient during their visit and any new prescriptions provided to the patient are listed below.  Medications given during this visit Medications  sodium chloride 0.9 % bolus 1,000 mL (not administered)  ondansetron (ZOFRAN) injection 4 mg (4 mg Intravenous Given 10/23/13 0247)  metoCLOPramide (REGLAN) injection 10 mg (10 mg Intravenous Given 10/23/13 0414)  diphenhydrAMINE (BENADRYL) injection 50 mg (50 mg Intravenous Given 10/23/13 0419)  sodium chloride 0.9 % bolus 2,000 mL (0 mLs Intravenous Stopped 10/23/13 0645)  promethazine (PHENERGAN) injection 25 mg (25 mg Intravenous Given 10/23/13 0511)  LORazepam (ATIVAN) injection 1 mg (1 mg Intravenous Given 10/23/13 0516)  dicyclomine (BENTYL) injection 20 mg (20 mg Intramuscular Given 10/23/13 0851)    New Prescriptions   LORAZEPAM (ATIVAN) 1 MG TABLET    Take 1 tablet (1 mg total) by mouth 3 (three) times daily as needed (nausea,  anxiety).   Clinical Impression 1. Gastroparesis      Pamella Pert, MD 10/23/13 1114

## 2013-10-23 NOTE — ED Notes (Signed)
Pt was just released from [facility] with gastroporesis flare up, he states he wasn't home one day and started vomiting again

## 2013-10-23 NOTE — ED Notes (Signed)
Pt discharged from [facility] yesterday morning due to gastroparesis.   Pt sts he went home and began taking his prescribed medications and started having abdominal pain and throwing up around 7pm last night. Pt A&Ox4.

## 2013-10-23 NOTE — ED Provider Notes (Signed)
CSN: [id number]     Arrival date & time 10/22/13  2353 History   First MD Initiated Contact with Patient 10/23/13 0251     Chief Complaint  Patient presents with  . Emesis     (Consider location/radiation/quality/duration/timing/severity/associated sxs/prior Treatment) HPI 38 year old male presents to emergency department from home with complaint of nausea and vomiting.  Patient has history of gastroparesis.  He had prolonged hospitalization in August from the 10th until the 25th, with readmission September 2-7.  Patient was transferred to [facility], discharged on the 10th.  Patient reports he once returning home, he had return of nausea and vomiting.  Patient reports that his home medications do not seem to help with his symptoms.  He has followup with the gastroenterologists at [facility] in 1 month for discussion of placement of gastric pacemaker.  He denies any fever chills diarrhea.  He reports abdominal pain in epigastric region. Past Medical History  Diagnosis Date  . Hypertension   . Diabetes mellitus without complication age 63    insulin requiring from the start  . Gastritis   . Gastroparesis 2012    100% gastric retention on 08/2013 [facility]  . Obesity   . Diabetic neuropathy    Past Surgical History  Procedure Laterality Date  . Cholecystectomy  2013  . Knee surgery Left   . Flexible sigmoidoscopy N/A 06/11/2013    Procedure: FLEXIBLE SIGMOIDOSCOPY;  Surgeon: [name], MD;  Location: [facility];  Service: Endoscopy;  Laterality: N/A;  . Esophagogastroduodenoscopy N/A 06/11/2013    Procedure: ESOPHAGOGASTRODUODENOSCOPY (EGD);  Surgeon: [name], MD;  Location: [facility];  Service: Endoscopy;  Laterality: N/A;   Family History  Problem Relation Age of Onset  . Diabetes Father    History  Substance Use Topics  . Smoking status: Never Smoker   . Smokeless tobacco: Never Used  . Alcohol Use: Yes     Comment: "none really" - 08/27/13    Review of  Systems   See History of Present Illness; otherwise all other systems are reviewed and negative  Allergies  Review of patient's allergies indicates no known allergies.  Home Medications   Prior to Admission medications   Medication Sig Start Date End Date Taking? Authorizing Provider  acetaminophen (TYLENOL) 500 MG tablet Take 500 mg by mouth every 6 (six) hours as needed for mild pain.    Yes Historical Provider, MD  amLODipine (NORVASC) 10 MG tablet Take 1 tablet (10 mg total) by mouth every morning. 08/29/13  Yes Venetia Maxon Rama, MD  cloNIDine (CATAPRES) 0.1 MG tablet Take 1 tablet (0.1 mg total) by mouth 2 (two) times daily. 08/29/13  Yes Christina P Rama, MD  gabapentin (NEURONTIN) 300 MG capsule Take 1 capsule (300 mg total) by mouth at bedtime. 08/29/13  Yes Venetia Maxon Rama, MD  HYDROcodone-acetaminophen (NORCO/VICODIN) 5-325 MG per tablet Take 1 tablet by mouth every 8 (eight) hours as needed for moderate pain. 10/06/13  Yes [name], MD  insulin aspart (NOVOLOG) 100 UNIT/ML injection Inject 9-12 Units into the skin 3 (three) times daily before meals. SSI 08/29/13  Yes Christina P Rama, MD  insulin glargine (LANTUS) 100 UNIT/ML injection Inject 0.1 mLs (10 Units total) into the skin at bedtime. 10/06/13  Yes [name], MD  metoCLOPramide (REGLAN) 10 MG tablet Take 10 mg by mouth 4 (four) times daily.   Yes Historical Provider, MD  metoprolol (LOPRESSOR) 50 MG tablet Take 50 mg by mouth 2 (two) times daily.  08/20/13  Yes Historical Provider, MD  pantoprazole (PROTONIX) 40 MG tablet Take 1 tablet (40 mg total) by mouth every morning. 08/29/13  Yes Christina P Rama, MD  polyethylene glycol (MIRALAX / GLYCOLAX) packet Take 17 g by mouth 2 (two) times daily. 10/06/13  Yes [name], MD  promethazine (PHENERGAN) 25 MG tablet Take 1 tablet (25 mg total) by mouth every 8 (eight) hours as needed for nausea or vomiting. 09/21/13  Yes Resa Miner Lawyer, PA-C  sucralfate  (CARAFATE) 1 G tablet Take 1 g by mouth 4 (four) times daily -  before meals and at bedtime.   Yes Historical Provider, MD  traMADol (ULTRAM) 50 MG tablet Take 50 mg by mouth every 6 (six) hours as needed for moderate pain.  08/19/13  Yes Historical Provider, MD  erythromycin ethylsuccinate (EES) 200 MG/5ML suspension Take 10 mLs (400 mg total) by mouth every 8 (eight) hours. 10/06/13   [name], MD  glucose blood (ONE TOUCH ULTRA TEST) test strip Use as instructed to check blood sugar once a day dx code 250.00 10/08/13   Elayne Snare, MD  [facility] DELICA LANCETS FINE MISC Use one per day dx code 250.00 10/08/13   Elayne Snare, MD  promethazine (PHENERGAN) 25 MG suppository Place 1 suppository (25 mg total) rectally every 6 (six) hours as needed for nausea or vomiting. 10/06/13   [name], MD   BP 160/101  Pulse 106  Temp(Src) 98.6 F (37 C) (Oral)  Resp 18  SpO2 96% Physical Exam  Nursing note and vitals reviewed. Constitutional: He is oriented to person, place, and time. He appears well-developed and well-nourished. He appears distressed (uncomfortable appearing).  HENT:  Head: Normocephalic and atraumatic.  Nose: Nose normal.  Mouth/Throat: Oropharynx is clear and moist.  Eyes: Conjunctivae and EOM are normal. Pupils are equal, round, and reactive to light.  Neck: Normal range of motion. Neck supple. No JVD present. No tracheal deviation present. No thyromegaly present.  Cardiovascular: Normal rate, regular rhythm, normal heart sounds and intact distal pulses.  Exam reveals no gallop and no friction rub.   No murmur heard. Pulmonary/Chest: Effort normal and breath sounds normal. No stridor. No respiratory distress. He has no wheezes. He has no rales. He exhibits no tenderness.  Abdominal: Soft. Bowel sounds are normal. He exhibits no distension and no mass. There is tenderness (epigastric tenderness). There is no rebound and no guarding.  Musculoskeletal: Normal range of  motion. He exhibits no edema and no tenderness.  Lymphadenopathy:    He has no cervical adenopathy.  Neurological: He is alert and oriented to person, place, and time. He displays normal reflexes. He exhibits normal muscle tone. Coordination normal.  Skin: Skin is warm and dry. No rash noted. No erythema. No pallor.  Psychiatric: He has a normal mood and affect. His behavior is normal. Judgment and thought content normal.    ED Course  Procedures (including critical care time) Labs Review Labs Reviewed  CBG MONITORING, ED - Abnormal; Notable for the following:    Glucose-Capillary 218 (*)    All other components within normal limits  CBC WITH DIFFERENTIAL  COMPREHENSIVE METABOLIC PANEL  I-STAT CG4 LACTIC ACID, ED    Imaging Review No results found.   EKG Interpretation None      MDM   Final diagnoses:  Gastroparesis    38 year old male with history of gastroparesis who presents with nausea and vomiting.  Patient is insistent on receiving IV Dilaudid to help with his pain associated with  gastroparesis.  I had long discussion with the patient I felt that IV narcotics were not in his best interest as they may worsen his gastroparesis and gastric motility.  Patient discharged yesterday from [facility].  Are there any R., patient was receiving Dilaudid IV every 2.  There may be a element of withdrawal associated with his nausea and vomiting.  Plan for IV fluids, symptom control  6:21 AM Patient has not had any further vomiting.  Patient reports he is still having pain.  I've offered him oral pain medications which he refuses.  Patient is a very difficult stick, and have not been able to obtain labs.  CBG is 218.  Plan to discharge home to followup with primary care Dr., gastroenterologist.  7:07 AM Pt reports nausea is returning, no vomiting noted.  He is still requesting IV dilaudid, reporting that a few doses of Dilaudid will most likely allow him to go home.  I have  re-emphasized to him I did not feel that this would help with his gastroparesis. Now that patient has received 2 liters of fluid, will have phlebotomy re-attempt lab draw, will give additional nausea medication.  Kalman Drape, MD 10/23/13 [phone]

## 2013-10-27 ENCOUNTER — Encounter: Payer: Self-pay | Admitting: Endocrinology

## 2013-11-02 ENCOUNTER — Ambulatory Visit: Payer: BC Managed Care – PPO | Admitting: Internal Medicine

## 2013-11-03 ENCOUNTER — Encounter: Payer: Self-pay | Admitting: *Deleted

## 2013-11-10 ENCOUNTER — Telehealth: Payer: Self-pay | Admitting: Radiology

## 2013-11-10 NOTE — Telephone Encounter (Signed)
error 

## 2013-11-12 ENCOUNTER — Other Ambulatory Visit: Payer: Self-pay | Admitting: Family Medicine

## 2013-11-12 DIAGNOSIS — R0602 Shortness of breath: Secondary | ICD-10-CM

## 2013-11-12 DIAGNOSIS — R42 Dizziness and giddiness: Secondary | ICD-10-CM

## 2013-11-13 ENCOUNTER — Other Ambulatory Visit (HOSPITAL_COMMUNITY): Payer: Self-pay | Admitting: Family Medicine

## 2013-11-13 ENCOUNTER — Other Ambulatory Visit: Payer: Self-pay | Admitting: Endocrinology

## 2013-11-13 ENCOUNTER — Ambulatory Visit
Admission: RE | Admit: 2013-11-13 | Discharge: 2013-11-13 | Disposition: A | Discharge status: Home / Self Care | Payer: BC Managed Care – PPO | Source: Ambulatory Visit | Attending: Family Medicine | Admitting: Family Medicine

## 2013-11-13 ENCOUNTER — Ambulatory Visit (HOSPITAL_COMMUNITY): Admission: RE | Admit: 2013-11-13 | Payer: BC Managed Care – PPO | Source: Ambulatory Visit

## 2013-11-13 DIAGNOSIS — R0602 Shortness of breath: Secondary | ICD-10-CM

## 2013-11-13 DIAGNOSIS — R42 Dizziness and giddiness: Secondary | ICD-10-CM

## 2013-11-16 ENCOUNTER — Ambulatory Visit (HOSPITAL_COMMUNITY)
Admission: RE | Admit: 2013-11-16 | Discharge: 2013-11-16 | Disposition: A | Discharge status: Home / Self Care | Payer: BC Managed Care – PPO | Source: Ambulatory Visit | Attending: Family Medicine | Admitting: Family Medicine

## 2013-11-16 ENCOUNTER — Encounter (HOSPITAL_COMMUNITY): Payer: Self-pay

## 2013-11-16 ENCOUNTER — Other Ambulatory Visit (HOSPITAL_COMMUNITY): Payer: Self-pay | Admitting: Family Medicine

## 2013-11-16 DIAGNOSIS — E119 Type 2 diabetes mellitus without complications: Secondary | ICD-10-CM | POA: Diagnosis not present

## 2013-11-16 DIAGNOSIS — R42 Dizziness and giddiness: Secondary | ICD-10-CM | POA: Diagnosis present

## 2013-11-16 DIAGNOSIS — Z794 Long term (current) use of insulin: Secondary | ICD-10-CM | POA: Diagnosis not present

## 2013-11-16 DIAGNOSIS — R0602 Shortness of breath: Secondary | ICD-10-CM | POA: Insufficient documentation

## 2013-11-16 DIAGNOSIS — R531 Weakness: Secondary | ICD-10-CM | POA: Diagnosis present

## 2013-11-16 DIAGNOSIS — I10 Essential (primary) hypertension: Secondary | ICD-10-CM | POA: Diagnosis not present

## 2013-11-16 MED ORDER — TECHNETIUM TO 99M ALBUMIN AGGREGATED
6.0000 | Freq: Once | INTRAVENOUS | Status: AC | PRN
  Administered 2013-11-16: 6 via INTRAVENOUS

## 2013-11-16 MED ORDER — TECHNETIUM TC 99M DIETHYLENETRIAME-PENTAACETIC ACID: 40.0000 | Freq: Once | INTRAVENOUS | Status: AC | PRN

## 2013-11-16 MED ORDER — IOHEXOL 350 MG/ML SOLN
80.0000 mL | Freq: Once | INTRAVENOUS | Status: AC | PRN
  Administered 2013-11-16: 80 mL via INTRAVENOUS

## 2013-12-01 ENCOUNTER — Telehealth: Payer: Self-pay | Admitting: Cardiology

## 2013-12-01 NOTE — Telephone Encounter (Signed)
Received 7 pages of records from [facility] (Dr [name]) for new patient appointment with Dr Percival Spanish on 12/18/13 . Records given to N. Hines (medical records) for Dr Cherlyn Cushing Schedule on 12/18/13 lp .

## 2013-12-15 ENCOUNTER — Encounter: Payer: Self-pay | Admitting: *Deleted

## 2013-12-17 ENCOUNTER — Telehealth: Payer: Self-pay | Admitting: Cardiology

## 2013-12-17 NOTE — Telephone Encounter (Signed)
Records received from [place] at [facility] for appointment on 11.6.15 with J Hochrein: given to Nenita in HIM department:djc

## 2013-12-18 ENCOUNTER — Ambulatory Visit: Payer: BC Managed Care – PPO | Admitting: Cardiology

## 2013-12-30 ENCOUNTER — Emergency Department (HOSPITAL_COMMUNITY): Payer: BC Managed Care – PPO

## 2013-12-30 ENCOUNTER — Inpatient Hospital Stay (HOSPITAL_COMMUNITY)
Admission: EM | Admit: 2013-12-30 | Discharge: 2014-01-10 | DRG: 074 | Disposition: A | Discharge status: Home / Self Care | Payer: BC Managed Care – PPO | Attending: Family Medicine | Admitting: Family Medicine

## 2013-12-30 ENCOUNTER — Encounter (HOSPITAL_COMMUNITY): Payer: Self-pay | Admitting: Emergency Medicine

## 2013-12-30 DIAGNOSIS — L97519 Non-pressure chronic ulcer of other part of right foot with unspecified severity: Secondary | ICD-10-CM | POA: Diagnosis present

## 2013-12-30 DIAGNOSIS — I10 Essential (primary) hypertension: Secondary | ICD-10-CM | POA: Diagnosis present

## 2013-12-30 DIAGNOSIS — N179 Acute kidney failure, unspecified: Secondary | ICD-10-CM | POA: Diagnosis present

## 2013-12-30 DIAGNOSIS — E1065 Type 1 diabetes mellitus with hyperglycemia: Secondary | ICD-10-CM | POA: Diagnosis present

## 2013-12-30 DIAGNOSIS — E109 Type 1 diabetes mellitus without complications: Secondary | ICD-10-CM

## 2013-12-30 DIAGNOSIS — R52 Pain, unspecified: Secondary | ICD-10-CM

## 2013-12-30 DIAGNOSIS — R110 Nausea: Secondary | ICD-10-CM

## 2013-12-30 DIAGNOSIS — R1013 Epigastric pain: Secondary | ICD-10-CM | POA: Diagnosis present

## 2013-12-30 DIAGNOSIS — Z794 Long term (current) use of insulin: Secondary | ICD-10-CM

## 2013-12-30 DIAGNOSIS — K3184 Gastroparesis: Secondary | ICD-10-CM | POA: Diagnosis present

## 2013-12-30 DIAGNOSIS — E108 Type 1 diabetes mellitus with unspecified complications: Secondary | ICD-10-CM

## 2013-12-30 DIAGNOSIS — Z79899 Other long term (current) drug therapy: Secondary | ICD-10-CM

## 2013-12-30 DIAGNOSIS — E1043 Type 1 diabetes mellitus with diabetic autonomic (poly)neuropathy: Secondary | ICD-10-CM | POA: Diagnosis not present

## 2013-12-30 DIAGNOSIS — E876 Hypokalemia: Secondary | ICD-10-CM | POA: Diagnosis not present

## 2013-12-30 DIAGNOSIS — E872 Acidosis: Secondary | ICD-10-CM | POA: Diagnosis present

## 2013-12-30 DIAGNOSIS — E860 Dehydration: Secondary | ICD-10-CM | POA: Diagnosis present

## 2013-12-30 DIAGNOSIS — IMO0002 Reserved for concepts with insufficient information to code with codable children: Secondary | ICD-10-CM | POA: Insufficient documentation

## 2013-12-30 DIAGNOSIS — K5900 Constipation, unspecified: Secondary | ICD-10-CM | POA: Diagnosis present

## 2013-12-30 LAB — COMPREHENSIVE METABOLIC PANEL
ALBUMIN: 4.6 g/dL (ref 3.5–5.2)
ALT: 13 U/L (ref 0–53)
AST: 14 U/L (ref 0–37)
Alkaline Phosphatase: 114 U/L (ref 39–117)
Anion gap: 20 — ABNORMAL HIGH (ref 5–15)
BUN: 20 mg/dL (ref 6–23)
CHLORIDE: 95 meq/L — AB (ref 96–112)
CO2: 21 meq/L (ref 19–32)
Calcium: 9.8 mg/dL (ref 8.4–10.5)
Creatinine, Ser: 1.59 mg/dL — ABNORMAL HIGH (ref 0.50–1.35)
GFR calc Af Amer: 63 mL/min — ABNORMAL LOW (ref >90)
GFR calc non Af Amer: 54 mL/min — ABNORMAL LOW (ref >90)
GLUCOSE: 244 mg/dL — AB (ref 70–99)
Potassium: 4.1 mEq/L (ref 3.7–5.3)
Sodium: 136 mEq/L — ABNORMAL LOW (ref 137–147)
Total Bilirubin: 0.4 mg/dL (ref 0.3–1.2)
Total Protein: 8.5 g/dL — ABNORMAL HIGH (ref 6.0–8.3)

## 2013-12-30 LAB — CBC WITH DIFFERENTIAL/PLATELET
Basophils Absolute: 0 10*3/uL (ref 0.0–0.1)
Basophils Relative: 0 % (ref 0–1)
Eosinophils Absolute: 0.1 10*3/uL (ref 0.0–0.7)
Eosinophils Relative: 1 % (ref 0–5)
HCT: 41.7 % (ref 39.0–52.0)
Hemoglobin: 14 g/dL (ref 13.0–17.0)
Lymphocytes Relative: 19 % (ref 12–46)
Lymphs Abs: 2.6 10*3/uL (ref 0.7–4.0)
MCH: 26.8 pg (ref 26.0–34.0)
MCHC: 33.6 g/dL (ref 30.0–36.0)
MCV: 79.7 fL (ref 78.0–100.0)
Monocytes Absolute: 0.6 10*3/uL (ref 0.1–1.0)
Monocytes Relative: 5 % (ref 3–12)
Neutro Abs: 10.2 10*3/uL — ABNORMAL HIGH (ref 1.7–7.7)
Neutrophils Relative %: 75 % (ref 43–77)
Platelets: 332 10*3/uL (ref 150–400)
RBC: 5.23 MIL/uL (ref 4.22–5.81)
RDW: 12.7 % (ref 11.5–15.5)
WBC: 13.5 10*3/uL — ABNORMAL HIGH (ref 4.0–10.5)

## 2013-12-30 LAB — CBG MONITORING, ED: Glucose-Capillary: 184 mg/dL — ABNORMAL HIGH (ref 70–99)

## 2013-12-30 MED ORDER — HYDROMORPHONE HCL 1 MG/ML IJ SOLN
1.0000 mg | Freq: Once | INTRAMUSCULAR | Status: AC
  Administered 2013-12-30: 1 mg via INTRAVENOUS
  Filled 2013-12-30: qty 1

## 2013-12-30 MED ORDER — LIDOCAINE HCL 1 % IJ SOLN: 5.0000 mL | Freq: Once | INTRAMUSCULAR | Status: DC

## 2013-12-30 MED ORDER — LIDOCAINE HCL (PF) 1 % IJ SOLN
INTRAMUSCULAR | Status: AC
  Filled 2013-12-30: qty 5

## 2013-12-30 MED ORDER — ONDANSETRON 4 MG PO TBDP
8.0000 mg | ORAL_TABLET | Freq: Once | ORAL | Status: AC
  Filled 2013-12-30: qty 2

## 2013-12-30 MED ORDER — METOCLOPRAMIDE HCL 5 MG/ML IJ SOLN
10.0000 mg | Freq: Once | INTRAMUSCULAR | Status: AC
  Administered 2013-12-30: 10 mg via INTRAVENOUS
  Filled 2013-12-30: qty 2

## 2013-12-30 MED ORDER — LIDOCAINE HCL (PF) 1 % IJ SOLN
5.0000 mL | Freq: Once | INTRAMUSCULAR | Status: DC
  Filled 2013-12-30: qty 5

## 2013-12-30 MED ORDER — SODIUM CHLORIDE 0.9 % IV BOLUS (SEPSIS)
2000.0000 mL | Freq: Once | INTRAVENOUS | Status: AC
  Administered 2013-12-30: 2000 mL via INTRAVENOUS

## 2013-12-30 NOTE — ED Notes (Signed)
Patient gone to radiology  

## 2013-12-30 NOTE — ED Provider Notes (Addendum)
CSN: [id number]     Arrival date & time 12/30/13  2058 History   First MD Initiated Contact with Patient 12/30/13 2301     Chief Complaint  Patient presents with  . Emesis     (Consider location/radiation/quality/duration/timing/severity/associated sxs/prior Treatment) HPI Duane [name] of epigastric pain and vomiting onset approximately 5 PM tonight typical gastroparesis he started the past he's vomited 12-14 times, yellow material. Last bowel movement here in the emergency Department: Normal. No fever. He's treated himself with Phenergan and tonics, without relief. He's been treated with Zofran here, continues to vomit other associated symptoms include nausea. Denies fever denies chest pain epigastric pain is burning and severe nonradiating. Past Medical History  Diagnosis Date  . Hypertension   . Diabetes mellitus without complication age 38    insulin requiring from the start  . Gastritis   . Gastroparesis 2012    100% gastric retention on 08/2013 [facility]  . Obesity   . Diabetic neuropathy    Past Surgical History  Procedure Laterality Date  . Cholecystectomy  2013  . Knee surgery Left   . Flexible sigmoidoscopy N/A 06/11/2013    Procedure: FLEXIBLE SIGMOIDOSCOPY;  Surgeon: [name], MD;  Location: [facility] ENDOSCOPY;  Service: Endoscopy;  Laterality: N/A;  . Esophagogastroduodenoscopy N/A 06/11/2013    Procedure: ESOPHAGOGASTRODUODENOSCOPY (EGD);  Surgeon: [name], MD;  Location: [facility];  Service: Endoscopy;  Laterality: N/A;   Family History  Problem Relation Age of Onset  . Diabetes Father    History  Substance Use Topics  . Smoking status: Never Smoker   . Smokeless tobacco: Never Used  . Alcohol Use: Yes     Comment: "none really" - 08/27/13    Review of Systems  Constitutional: Negative.   HENT: Negative.   Respiratory: Negative.   Cardiovascular: Negative.   Gastrointestinal: Positive for nausea, vomiting and abdominal pain.  Musculoskeletal: Negative.    Skin: Negative.   Neurological: Negative.   Psychiatric/Behavioral: Negative.   All other systems reviewed and are negative.     Allergies  Review of patient's allergies indicates no known allergies.  Home Medications   Prior to Admission medications   Medication Sig Start Date End Date Taking? Authorizing Provider  acetaminophen (TYLENOL) 500 MG tablet Take 500 mg by mouth every 6 (six) hours as needed for mild pain.     Historical Provider, MD  amLODipine (NORVASC) 10 MG tablet Take 1 tablet (10 mg total) by mouth every morning. 08/29/13   Venetia Maxon Rama, MD  cloNIDine (CATAPRES) 0.1 MG tablet Take 1 tablet (0.1 mg total) by mouth 2 (two) times daily. 08/29/13   Venetia Maxon Rama, MD  erythromycin ethylsuccinate (EES) 200 MG/5ML suspension Take 10 mLs (400 mg total) by mouth every 8 (eight) hours. 10/06/13   [name], MD  gabapentin (NEURONTIN) 300 MG capsule Take 1 capsule (300 mg total) by mouth at bedtime. 08/29/13   Venetia Maxon Rama, MD  glucose blood (ONE TOUCH ULTRA TEST) test strip Use as instructed to check blood sugar once a day dx code 250.00 10/08/13   Elayne Snare, MD  HYDROcodone-acetaminophen (NORCO/VICODIN) 5-325 MG per tablet Take 1 tablet by mouth every 8 (eight) hours as needed for moderate pain. 10/06/13   [name], MD  insulin aspart (NOVOLOG) 100 UNIT/ML injection Inject 9-12 Units into the skin 3 (three) times daily before meals. SSI 08/29/13   Christina P Rama, MD  insulin glargine (LANTUS) 100 UNIT/ML injection Inject 0.1 mLs (10 Units total) into  the skin at bedtime. 10/06/13   [name], MD  LORazepam (ATIVAN) 1 MG tablet Take 1 tablet (1 mg total) by mouth 3 (three) times daily as needed (nausea, anxiety). 10/23/13   Kalman Drape, MD  metoCLOPramide (REGLAN) 10 MG tablet Take 10 mg by mouth 4 (four) times daily.    Historical Provider, MD  metoprolol (LOPRESSOR) 50 MG tablet Take 50 mg by mouth 2 (two) times daily.  08/20/13   Historical  Provider, MD  NOVOLOG FLEXPEN 100 UNIT/ML FlexPen USE AS DIRECTED. USE 1 TO 8 UNITS WITH MEALS AS NEEDED ACCORDING TO SLIDING SCALE 11/13/13   Elayne Snare, MD  [facility] DELICA LANCETS FINE MISC Use one per day dx code 250.00 10/08/13   Elayne Snare, MD  pantoprazole (PROTONIX) 40 MG tablet Take 1 tablet (40 mg total) by mouth every morning. 08/29/13   Venetia Maxon Rama, MD  polyethylene glycol (MIRALAX / GLYCOLAX) packet Take 17 g by mouth 2 (two) times daily. 10/06/13   [name], MD  promethazine (PHENERGAN) 25 MG suppository Place 1 suppository (25 mg total) rectally every 6 (six) hours as needed for nausea or vomiting. 10/06/13   [name], MD  promethazine (PHENERGAN) 25 MG tablet Take 1 tablet (25 mg total) by mouth every 8 (eight) hours as needed for nausea or vomiting. 09/21/13   Resa Miner Lawyer, PA-C  sucralfate (CARAFATE) 1 G tablet Take 1 g by mouth 4 (four) times daily -  before meals and at bedtime.    Historical Provider, MD  traMADol (ULTRAM) 50 MG tablet Take 50 mg by mouth every 6 (six) hours as needed for moderate pain.  08/19/13   Historical Provider, MD   BP 141/89 mmHg  Pulse 97  Temp(Src) 97.5 F (36.4 C) (Oral)  Resp 18  SpO2 100% Physical Exam  Constitutional: He appears well-developed and well-nourished. He appears distressed.  Appears uncomfortable, retching  HENT:  Head: Normocephalic and atraumatic.  Eyes: Conjunctivae are normal. Pupils are equal, round, and reactive to light.  Neck: Neck supple. No tracheal deviation present. No thyromegaly present.  Cardiovascular: Normal rate and regular rhythm.   No murmur heard. Pulmonary/Chest: Effort normal and breath sounds normal.  Abdominal: Soft. He exhibits no distension. There is tenderness.  Bowel sounds absenttender at epigastrium  Musculoskeletal: Normal range of motion. He exhibits no edema or tenderness.  Neurological: He is alert. Coordination normal.  Skin: Skin is warm. No rash noted. He is  diaphoretic.  Psychiatric: He has a normal mood and affect.  Nursing note and vitals reviewed.   ED Course  Procedures (including critical care time) Labs Review Labs Reviewed  COMPREHENSIVE METABOLIC PANEL - Abnormal; Notable for the following:    Sodium 136 (*)    Chloride 95 (*)    Glucose, Bld 244 (*)    Creatinine, Ser 1.59 (*)    Total Protein 8.5 (*)    GFR calc non Af Amer 54 (*)    GFR calc Af Amer 63 (*)    Anion gap 20 (*)    All other components within normal limits  CBC WITH DIFFERENTIAL - Abnormal; Notable for the following:    WBC 13.5 (*)    Neutro Abs 10.2 (*)    All other components within normal limits  CBG MONITORING, ED - Abnormal; Notable for the following:    Glucose-Capillary 184 (*)    All other components within normal limits    Imaging Review No results found.   EKG Interpretation  None     Nursing unable to establish peripheral IV access. Angiocath insertion Performed by: Orlie Dakin  Consent: Verbal consent obtained. Risks and benefits: risks, benefits and alternatives were discussed Time out: Immediately prior to procedure a "time out" was called to verify the correct patient, procedure, equipment, support staff and site/side marked as required.  Preparation: Patient was prepped and draped in the usual sterile fashion.  Vein Location: left extrenal jugular    Gauge: 20  Normal blood return and flush without difficulty Patient tolerance: Patient tolerated the procedure well with no immediate complications.  She felt improved after treatment with intravenous fluids, opioids and antiemetics, however 1:10 AM he again complained of epigastric pain and nausea. He had vomited again after treatment with intravenous Reglan. Additional hydromorphone and Zofran ordered intravenously X-rays viewed by me Results for orders placed or performed during the hospital encounter of 12/30/13  Comprehensive metabolic panel  Result Value Ref Range    Sodium 136 (L) 137 - 147 mEq/L   Potassium 4.1 3.7 - 5.3 mEq/L   Chloride 95 (L) 96 - 112 mEq/L   CO2 21 19 - 32 mEq/L   Glucose, Bld 244 (H) 70 - 99 mg/dL   BUN 20 6 - 23 mg/dL   Creatinine, Ser 1.59 (H) 0.50 - 1.35 mg/dL   Calcium 9.8 8.4 - 10.5 mg/dL   Total Protein 8.5 (H) 6.0 - 8.3 g/dL   Albumin 4.6 3.5 - 5.2 g/dL   AST 14 0 - 37 U/L   ALT 13 0 - 53 U/L   Alkaline Phosphatase 114 39 - 117 U/L   Total Bilirubin 0.4 0.3 - 1.2 mg/dL   GFR calc non Af Amer 54 (L) >90 mL/min   GFR calc Af Amer 63 (L) >90 mL/min   Anion gap 20 (H) 5 - 15  CBC with Differential  Result Value Ref Range   WBC 13.5 (H) 4.0 - 10.5 K/uL   RBC 5.23 4.22 - 5.81 MIL/uL   Hemoglobin 14.0 13.0 - 17.0 g/dL   HCT 41.7 39.0 - 52.0 %   MCV 79.7 78.0 - 100.0 fL   MCH 26.8 26.0 - 34.0 pg   MCHC 33.6 30.0 - 36.0 g/dL   RDW 12.7 11.5 - 15.5 %   Platelets 332 150 - 400 K/uL   Neutrophils Relative % 75 43 - 77 %   Neutro Abs 10.2 (H) 1.7 - 7.7 K/uL   Lymphocytes Relative 19 12 - 46 %   Lymphs Abs 2.6 0.7 - 4.0 K/uL   Monocytes Relative 5 3 - 12 %   Monocytes Absolute 0.6 0.1 - 1.0 K/uL   Eosinophils Relative 1 0 - 5 %   Eosinophils Absolute 0.1 0.0 - 0.7 K/uL   Basophils Relative 0 0 - 1 %   Basophils Absolute 0.0 0.0 - 0.1 K/uL  Troponin I  Result Value Ref Range   Troponin I <0.30 <0.30 ng/mL  POC CBG, ED  Result Value Ref Range   Glucose-Capillary 184 (H) 70 - 99 mg/dL   Dg Abd Acute W/chest  12/30/2013   CLINICAL DATA:  Vomiting and abdominal pain  EXAM: ACUTE ABDOMEN SERIES (ABDOMEN 2 VIEW & CHEST 1 VIEW)  COMPARISON:  11/16/2013  FINDINGS: Cardiac shadow is within normal limits.  The lungs are clear.  Scattered large and small bowel gas is noted. Fecal material is noted throughout the colon. No obstructive changes are seen. No abnormal mass or abnormal calcifications are noted. No free air is  seen.  IMPRESSION: Moderate fecal burden.  No acute abdominal abnormality is noted.   Electronically Signed    By: Inez Catalina M.D.   On: 12/30/2013 23:49    MDM  Poke with Dr. Posey Pronto  Plan 23 hour observation, telemetryIV fluids, clear liquid diet, symptomatic control., Glycemic management #1 epigastric pain #2 renal insufficiency #3 hyperglycemia #4nausea and vomiting Final diagnoses:  None        Orlie Dakin, MD 12/31/13 0127  Orlie Dakin, MD 12/31/13 [id number]

## 2013-12-30 NOTE — ED Notes (Addendum)
Pt is very diaphoretic; states he has gastroparesis. Vomiting and pale. States he had episode in September and had to be hospitalized. CBG 184.

## 2013-12-30 NOTE — ED Notes (Signed)
cbg 184

## 2013-12-30 NOTE — ED Notes (Signed)
Pt. Given zofran ODT in triage.

## 2013-12-31 DIAGNOSIS — R1013 Epigastric pain: Secondary | ICD-10-CM | POA: Diagnosis present

## 2013-12-31 DIAGNOSIS — N179 Acute kidney failure, unspecified: Secondary | ICD-10-CM | POA: Diagnosis present

## 2013-12-31 DIAGNOSIS — K3184 Gastroparesis: Secondary | ICD-10-CM | POA: Diagnosis present

## 2013-12-31 DIAGNOSIS — K5900 Constipation, unspecified: Secondary | ICD-10-CM

## 2013-12-31 DIAGNOSIS — E1065 Type 1 diabetes mellitus with hyperglycemia: Secondary | ICD-10-CM | POA: Diagnosis present

## 2013-12-31 DIAGNOSIS — I10 Essential (primary) hypertension: Secondary | ICD-10-CM | POA: Diagnosis present

## 2013-12-31 DIAGNOSIS — E876 Hypokalemia: Secondary | ICD-10-CM | POA: Diagnosis not present

## 2013-12-31 DIAGNOSIS — E119 Type 2 diabetes mellitus without complications: Secondary | ICD-10-CM

## 2013-12-31 DIAGNOSIS — Z794 Long term (current) use of insulin: Secondary | ICD-10-CM

## 2013-12-31 DIAGNOSIS — E872 Acidosis: Secondary | ICD-10-CM | POA: Diagnosis present

## 2013-12-31 DIAGNOSIS — E860 Dehydration: Secondary | ICD-10-CM | POA: Diagnosis present

## 2013-12-31 DIAGNOSIS — Z79899 Other long term (current) drug therapy: Secondary | ICD-10-CM | POA: Diagnosis not present

## 2013-12-31 DIAGNOSIS — L97519 Non-pressure chronic ulcer of other part of right foot with unspecified severity: Secondary | ICD-10-CM | POA: Diagnosis present

## 2013-12-31 DIAGNOSIS — E1043 Type 1 diabetes mellitus with diabetic autonomic (poly)neuropathy: Secondary | ICD-10-CM | POA: Diagnosis present

## 2013-12-31 LAB — CBC WITH DIFFERENTIAL/PLATELET
BASOS ABS: 0 10*3/uL (ref 0.0–0.1)
Basophils Relative: 0 % (ref 0–1)
EOS ABS: 0 10*3/uL (ref 0.0–0.7)
Eosinophils Relative: 0 % (ref 0–5)
HCT: 36.2 % — ABNORMAL LOW (ref 39.0–52.0)
Hemoglobin: 12.4 g/dL — ABNORMAL LOW (ref 13.0–17.0)
LYMPHS PCT: 13 % (ref 12–46)
Lymphs Abs: 1.7 10*3/uL (ref 0.7–4.0)
MCH: 27.3 pg (ref 26.0–34.0)
MCHC: 34.3 g/dL (ref 30.0–36.0)
MCV: 79.6 fL (ref 78.0–100.0)
Monocytes Absolute: 0.7 10*3/uL (ref 0.1–1.0)
Monocytes Relative: 5 % (ref 3–12)
Neutro Abs: 10.9 10*3/uL — ABNORMAL HIGH (ref 1.7–7.7)
Neutrophils Relative %: 82 % — ABNORMAL HIGH (ref 43–77)
Platelets: UNDETERMINED 10*3/uL (ref 150–400)
RBC: 4.55 MIL/uL (ref 4.22–5.81)
RDW: 13 % (ref 11.5–15.5)
WBC: 13.3 10*3/uL — ABNORMAL HIGH (ref 4.0–10.5)

## 2013-12-31 LAB — GLUCOSE, CAPILLARY
GLUCOSE-CAPILLARY: 177 mg/dL — AB (ref 70–99)
GLUCOSE-CAPILLARY: 189 mg/dL — AB (ref 70–99)
GLUCOSE-CAPILLARY: 305 mg/dL — AB (ref 70–99)
Glucose-Capillary: 239 mg/dL — ABNORMAL HIGH (ref 70–99)
Glucose-Capillary: 142 mg/dL — ABNORMAL HIGH (ref 70–99)
Glucose-Capillary: 163 mg/dL — ABNORMAL HIGH (ref 70–99)

## 2013-12-31 LAB — COMPREHENSIVE METABOLIC PANEL
ALT: 14 U/L (ref 0–53)
ANION GAP: 22 — AB (ref 5–15)
AST: 13 U/L (ref 0–37)
Albumin: 4.5 g/dL (ref 3.5–5.2)
Alkaline Phosphatase: 108 U/L (ref 39–117)
BILIRUBIN TOTAL: 0.3 mg/dL (ref 0.3–1.2)
BUN: 22 mg/dL (ref 6–23)
CHLORIDE: 98 meq/L (ref 96–112)
CO2: 18 meq/L — AB (ref 19–32)
Calcium: 9.3 mg/dL (ref 8.4–10.5)
Creatinine, Ser: 1.38 mg/dL — ABNORMAL HIGH (ref 0.50–1.35)
GFR calc Af Amer: 74 mL/min — ABNORMAL LOW (ref >90)
GFR calc non Af Amer: 64 mL/min — ABNORMAL LOW (ref >90)
Glucose, Bld: 290 mg/dL — ABNORMAL HIGH (ref 70–99)
POTASSIUM: 6.8 meq/L — AB (ref 3.7–5.3)
Sodium: 138 mEq/L (ref 137–147)
Total Protein: 8.5 g/dL — ABNORMAL HIGH (ref 6.0–8.3)

## 2013-12-31 LAB — BASIC METABOLIC PANEL
Anion gap: 15 (ref 5–15)
BUN: 20 mg/dL (ref 6–23)
CALCIUM: 9.2 mg/dL (ref 8.4–10.5)
CO2: 22 mEq/L (ref 19–32)
CREATININE: 1.22 mg/dL (ref 0.50–1.35)
Chloride: 103 mEq/L (ref 96–112)
GFR calc Af Amer: 86 mL/min — ABNORMAL LOW (ref >90)
GFR, EST NON AFRICAN AMERICAN: 74 mL/min — AB (ref >90)
GLUCOSE: 157 mg/dL — AB (ref 70–99)
Potassium: 4.9 mEq/L (ref 3.7–5.3)
SODIUM: 140 meq/L (ref 137–147)

## 2013-12-31 LAB — C-REACTIVE PROTEIN: CRP: <0.5 mg/dL — ABNORMAL LOW (ref <0.60)

## 2013-12-31 LAB — KETONES, QUALITATIVE: Acetone, Bld: NEGATIVE

## 2013-12-31 LAB — TROPONIN I: Troponin I: <0.3 ng/mL (ref <0.30)

## 2013-12-31 LAB — SEDIMENTATION RATE: Sed Rate: 11 mm/hr (ref 0–16)

## 2013-12-31 MED ORDER — PROMETHAZINE HCL 25 MG/ML IJ SOLN
12.5000 mg | Freq: Four times a day (QID) | INTRAMUSCULAR | Status: DC | PRN
  Administered 2013-12-31 (×2): 25 mg via INTRAVENOUS
  Filled 2013-12-31 (×2): qty 1

## 2013-12-31 MED ORDER — POLYETHYLENE GLYCOL 3350 17 G PO PACK: 17.0000 g | PACK | Freq: Every day | ORAL | Status: DC

## 2013-12-31 MED ORDER — SODIUM CHLORIDE 0.9 % IV SOLN
INTRAVENOUS | Status: DC
  Administered 2013-12-31: 1000 mL via INTRAVENOUS

## 2013-12-31 MED ORDER — HYDROMORPHONE HCL 1 MG/ML IJ SOLN
1.0000 mg | Freq: Once | INTRAMUSCULAR | Status: AC
  Administered 2013-12-31: 1 mg via INTRAVENOUS
  Filled 2013-12-31: qty 1

## 2013-12-31 MED ORDER — ONDANSETRON HCL 4 MG PO TABS
4.0000 mg | ORAL_TABLET | Freq: Four times a day (QID) | ORAL | Status: DC | PRN
  Administered 2014-01-03: 4 mg via ORAL
  Filled 2013-12-31: qty 1

## 2013-12-31 MED ORDER — POLYETHYLENE GLYCOL 3350 17 G PO PACK
17.0000 g | PACK | Freq: Two times a day (BID) | ORAL | Status: DC
  Administered 2014-01-03 – 2014-01-09 (×6): 17 g via ORAL
  Filled 2013-12-31 (×21): qty 1

## 2013-12-31 MED ORDER — HYDRALAZINE HCL 20 MG/ML IJ SOLN
10.0000 mg | INTRAMUSCULAR | Status: DC | PRN
  Administered 2014-01-01 – 2014-01-05 (×3): 10 mg via INTRAVENOUS
  Filled 2013-12-31 (×5): qty 1

## 2013-12-31 MED ORDER — METOCLOPRAMIDE HCL 5 MG/ML IJ SOLN
10.0000 mg | Freq: Three times a day (TID) | INTRAMUSCULAR | Status: DC
Start: 2013-12-31 — End: 2014-01-05
  Administered 2013-12-31 – 2014-01-05 (×17): 10 mg via INTRAVENOUS
  Filled 2013-12-31 (×19): qty 2

## 2013-12-31 MED ORDER — METOPROLOL TARTRATE 1 MG/ML IV SOLN
2.5000 mg | Freq: Four times a day (QID) | INTRAVENOUS | Status: DC
  Administered 2013-12-31 – 2014-01-01 (×6): 2.5 mg via INTRAVENOUS
  Filled 2013-12-31 (×9): qty 5

## 2013-12-31 MED ORDER — ACETAMINOPHEN 650 MG RE SUPP: 650.0000 mg | Freq: Four times a day (QID) | RECTAL | Status: DC | PRN

## 2013-12-31 MED ORDER — ONDANSETRON HCL 4 MG/2ML IJ SOLN
4.0000 mg | Freq: Three times a day (TID) | INTRAMUSCULAR | Status: DC | PRN
  Administered 2013-12-31: 4 mg via INTRAVENOUS
  Filled 2013-12-31: qty 2

## 2013-12-31 MED ORDER — ACETAMINOPHEN 325 MG PO TABS: 650.0000 mg | ORAL_TABLET | Freq: Four times a day (QID) | ORAL | Status: DC | PRN

## 2013-12-31 MED ORDER — SULFAMETHOXAZOLE-TRIMETHOPRIM 800-160 MG PO TABS
1.0000 | ORAL_TABLET | Freq: Two times a day (BID) | ORAL | Status: DC
  Administered 2014-01-03 – 2014-01-10 (×10): 1 via ORAL
  Filled 2013-12-31 (×21): qty 1

## 2013-12-31 MED ORDER — ONDANSETRON 8 MG/NS 50 ML IVPB
8.0000 mg | Freq: Four times a day (QID) | INTRAVENOUS | Status: DC | PRN
  Administered 2014-01-01 – 2014-01-05 (×7): 8 mg via INTRAVENOUS
  Filled 2013-12-31 (×12): qty 8

## 2013-12-31 MED ORDER — HEPARIN SODIUM (PORCINE) 5000 UNIT/ML IJ SOLN
5000.0000 [IU] | Freq: Three times a day (TID) | INTRAMUSCULAR | Status: DC
  Administered 2013-12-31 – 2014-01-09 (×27): 5000 [IU] via SUBCUTANEOUS
  Filled 2013-12-31 (×39): qty 1

## 2013-12-31 MED ORDER — POTASSIUM CHLORIDE IN NACL 20-0.9 MEQ/L-% IV SOLN
INTRAVENOUS | Status: DC
  Administered 2013-12-31: 06:00:00 via INTRAVENOUS
  Filled 2013-12-31 (×2): qty 1000

## 2013-12-31 MED ORDER — HYDROMORPHONE HCL 1 MG/ML IJ SOLN: 1.0000 mg | INTRAMUSCULAR | Status: DC | PRN

## 2013-12-31 MED ORDER — MORPHINE SULFATE 2 MG/ML IJ SOLN
1.0000 mg | INTRAMUSCULAR | Status: DC | PRN
  Administered 2014-01-01 – 2014-01-05 (×15): 1 mg via INTRAVENOUS
  Filled 2013-12-31 (×17): qty 1

## 2013-12-31 MED ORDER — HYDRALAZINE HCL 20 MG/ML IJ SOLN: 10.0000 mg | INTRAMUSCULAR | Status: DC | PRN

## 2013-12-31 MED ORDER — SODIUM CHLORIDE 0.9 % IJ SOLN
3.0000 mL | Freq: Two times a day (BID) | INTRAMUSCULAR | Status: DC
  Administered 2013-12-31 – 2014-01-10 (×14): 3 mL via INTRAVENOUS

## 2013-12-31 MED ORDER — PROMETHAZINE HCL 25 MG/ML IJ SOLN
12.5000 mg | Freq: Four times a day (QID) | INTRAMUSCULAR | Status: DC | PRN
  Administered 2013-12-31 – 2014-01-05 (×10): 12.5 mg via INTRAVENOUS
  Filled 2013-12-31 (×12): qty 1

## 2013-12-31 MED ORDER — PANTOPRAZOLE SODIUM 40 MG IV SOLR
40.0000 mg | Freq: Every day | INTRAVENOUS | Status: DC
  Administered 2014-01-01: 40 mg via INTRAVENOUS
  Filled 2013-12-31 (×2): qty 40

## 2013-12-31 MED ORDER — INSULIN GLARGINE 100 UNIT/ML ~~LOC~~ SOLN
20.0000 [IU] | Freq: Every day | SUBCUTANEOUS | Status: DC
  Administered 2014-01-01 – 2014-01-09 (×9): 20 [IU] via SUBCUTANEOUS
  Filled 2013-12-31 (×11): qty 0.2

## 2013-12-31 MED ORDER — CLINDAMYCIN PHOSPHATE 300 MG/50ML IV SOLN
300.0000 mg | Freq: Four times a day (QID) | INTRAVENOUS | Status: DC
  Administered 2013-12-31 (×2): 300 mg via INTRAVENOUS
  Filled 2013-12-31 (×3): qty 50

## 2013-12-31 MED ORDER — ONDANSETRON HCL 4 MG/2ML IJ SOLN
4.0000 mg | Freq: Once | INTRAMUSCULAR | Status: AC
  Administered 2013-12-31: 4 mg via INTRAVENOUS
  Filled 2013-12-31: qty 2

## 2013-12-31 MED ORDER — GABAPENTIN 300 MG PO CAPS
300.0000 mg | ORAL_CAPSULE | Freq: Every day | ORAL | Status: DC
  Administered 2014-01-03 – 2014-01-09 (×4): 300 mg via ORAL
  Filled 2013-12-31 (×11): qty 1

## 2013-12-31 MED ORDER — COLLAGENASE 250 UNIT/GM EX OINT
TOPICAL_OINTMENT | Freq: Every day | CUTANEOUS | Status: DC
Start: 2013-12-31 — End: 2014-01-10
  Administered 2013-12-31 – 2014-01-09 (×9): via TOPICAL
  Administered 2014-01-10: 1 via TOPICAL
  Filled 2013-12-31: qty 30

## 2013-12-31 MED ORDER — SODIUM CHLORIDE 0.9 % IV SOLN
INTRAVENOUS | Status: AC
  Administered 2013-12-31: 19:00:00 via INTRAVENOUS
  Administered 2013-12-31: 100 mL/h via INTRAVENOUS
  Administered 2013-12-31: 08:00:00 via INTRAVENOUS

## 2013-12-31 MED ORDER — SENNA 8.6 MG PO TABS
2.0000 | ORAL_TABLET | Freq: Two times a day (BID) | ORAL | Status: DC
  Administered 2014-01-03 – 2014-01-09 (×6): 17.2 mg via ORAL
  Filled 2013-12-31 (×21): qty 2

## 2013-12-31 MED ORDER — INSULIN ASPART 100 UNIT/ML ~~LOC~~ SOLN
0.0000 [IU] | SUBCUTANEOUS | Status: DC
  Administered 2013-12-31: 11 [IU] via SUBCUTANEOUS
  Administered 2013-12-31: 2 [IU] via SUBCUTANEOUS
  Administered 2013-12-31: 5 [IU] via SUBCUTANEOUS
  Administered 2013-12-31 – 2014-01-02 (×8): 3 [IU] via SUBCUTANEOUS
  Administered 2014-01-02: 2 [IU] via SUBCUTANEOUS
  Administered 2014-01-02 – 2014-01-03 (×5): 3 [IU] via SUBCUTANEOUS
  Administered 2014-01-03: 2 [IU] via SUBCUTANEOUS
  Administered 2014-01-03: 3 [IU] via SUBCUTANEOUS
  Administered 2014-01-03: 8 [IU] via SUBCUTANEOUS
  Administered 2014-01-03: 2 [IU] via SUBCUTANEOUS
  Administered 2014-01-04 (×3): 3 [IU] via SUBCUTANEOUS
  Administered 2014-01-05: 5 [IU] via SUBCUTANEOUS
  Administered 2014-01-05 (×2): 3 [IU] via SUBCUTANEOUS
  Administered 2014-01-05: 2 [IU] via SUBCUTANEOUS
  Administered 2014-01-05 – 2014-01-06 (×2): 3 [IU] via SUBCUTANEOUS
  Administered 2014-01-06 (×2): 2 [IU] via SUBCUTANEOUS
  Administered 2014-01-06: 3 [IU] via SUBCUTANEOUS
  Administered 2014-01-06: 2 [IU] via SUBCUTANEOUS
  Administered 2014-01-06: 3 [IU] via SUBCUTANEOUS
  Administered 2014-01-07 (×3): 2 [IU] via SUBCUTANEOUS
  Administered 2014-01-07: 3 [IU] via SUBCUTANEOUS
  Administered 2014-01-07: 2 [IU] via SUBCUTANEOUS
  Administered 2014-01-07 – 2014-01-08 (×2): 3 [IU] via SUBCUTANEOUS
  Administered 2014-01-08 (×2): 2 [IU] via SUBCUTANEOUS
  Administered 2014-01-08: 5 [IU] via SUBCUTANEOUS
  Administered 2014-01-09: 2 [IU] via SUBCUTANEOUS
  Administered 2014-01-09: 5 [IU] via SUBCUTANEOUS
  Administered 2014-01-09 – 2014-01-10 (×3): 3 [IU] via SUBCUTANEOUS
  Administered 2014-01-10: 2 [IU] via SUBCUTANEOUS

## 2013-12-31 MED ORDER — SENNA 8.6 MG PO TABS: 2.0000 | ORAL_TABLET | Freq: Every day | ORAL | Status: DC

## 2013-12-31 MED ORDER — MORPHINE SULFATE 2 MG/ML IJ SOLN
2.0000 mg | INTRAMUSCULAR | Status: DC | PRN
  Administered 2013-12-31: 2 mg via INTRAVENOUS
  Filled 2013-12-31: qty 1

## 2013-12-31 MED ORDER — SODIUM CHLORIDE 0.9 % IV SOLN
250.0000 mg | Freq: Four times a day (QID) | INTRAVENOUS | Status: DC
  Administered 2013-12-31 – 2014-01-02 (×10): 250 mg via INTRAVENOUS
  Filled 2013-12-31 (×19): qty 5

## 2013-12-31 NOTE — Progress Notes (Signed)
CRITICAL VALUE ALERT  Critical value received:  Potassium 6.8  Date of notification:  12/31/2013  Time of notification:  0727  Critical value read back:yes  Nurse who received alert:  Kathyrn Drown RN  MD notified (1st page):  Dr.Hongalgi  Time of first page:  [phone]  MD notified (2nd page):  Time of second page:  Responding MD:  Dr.Hongalgi  Time MD responded:  [phone]

## 2013-12-31 NOTE — Progress Notes (Addendum)
PROGRESS NOTE    Duane [name] [id number] DOB: 06-09-1975 DOA: 12/30/2013 PCP: [name], MD  HPI/Brief narrative 38 year old male with history of IDDM with neuropathy, diabetic gastroparesis with recurrent flare, essential hypertension, presented with subacute onset of nausea, vomiting and epigastric pain that started night prior to admission. He was treated by PCP for right great toe wound infection with Bactrim.   Assessment/Plan:  1. Diabetic gastroparesis: Patient was hospitalized in September 2015 for intractable nausea and vomiting attributed to diabetic gastroparesis. Gastroparesis was confirmed by gastric emptying scan on 08/18/13. GI had been consulted during that admission. Patient kept asking for IV Dilaudid despite advised to reduce by MDs. He was treated empirically with IV Reglan, PPI, erythromycin and antiemetics. Finally he was transferred to [facility] for further management. Patient states that he has been fine with no complaints until this admission. Will treat supportively with bowel rest, IV Reglan (brief), IV Erthythromycin (brief) and when necessary Zofran. We'll minimize opioids. (Opioid seeking behavior as per previous admission notes.) . Advance diet gradually as tolerated. Has moderate stool burden by x-ray-laxatives. 2. Dehydration: Secondary to problem #1. Continue IV fluids for additional 24 hours and then reassess. 3. Acute renal failure: Likely prerenal from dehydration. Improving. Continue IV fluids. Hyperkalemia noted on this morning's lab was erroneous and repeated with normal values. 4. Uncontrolled type I DM with peripheral neuropathy and gastroparesis: Hemoglobin A1c in August was 9.3 suggesting poor control. Continue Lantus and SSI.  5. Essential hypertension: Mildly uncontrolled. Continue scheduled IV metoprolol and IV hydralazine when necessary. 6. Right great toe wound infection: Clinically improved on OP Bactrim-continue same and DC  clindamycin. Wound care consultation appreciated -recommend follow-up at [place] [facility] for follow-up assessment and sharp debridement once the nonviable tissue has softened .     Code Status: Full Family Communication: None at bedside.  Disposition Plan: Home when medically stable.   Consultants:  None   Procedures:  None   Antibiotics:  Clindamycin-DC   Subjective: Feels better than he did last night. Still with some nausea and mild vomiting this morning. Denied abdominal pain. Had a normal BM yesterday.  Objective: Filed Vitals:   12/31/13 0200 12/31/13 0223 12/31/13 0300 12/31/13 0602  BP: 111/66 153/87 187/93 157/73  Pulse: 98 98    Temp:   98.4 F (36.9 C) 98.4 F (36.9 C)  TempSrc:   Oral Oral  Resp: 12 9 18 20   Height:   6\' 3"  (1.905 m)   Weight:   120.5 kg (265 lb 10.5 oz)   SpO2: 98% 97% 100% 100%    Intake/Output Summary (Last 24 hours) at 12/31/13 1341 Last data filed at 12/31/13 1308  Gross per 24 hour  Intake 2813.33 ml  Output      0 ml  Net 2813.33 ml   Filed Weights   12/31/13 0300  Weight: 120.5 kg (265 lb 10.5 oz)     Exam:  General exam: Pleasant young male lying comfortably in bed. Respiratory system: Clear. No increased work of breathing. Cardiovascular system: S1 & S2 heard, RRR. No JVD, murmurs, gallops, clicks or pedal edema. Gastrointestinal system: Abdomen is nondistended, soft and nontender. Normal bowel sounds heard. Central nervous system: Alert and oriented. No focal neurological deficits. Extremities: Symmetric 5 x 5 power.   Data Reviewed: Basic Metabolic Panel:  Recent Labs Lab 12/30/13 2141 12/31/13 0550 12/31/13 0952  NA 136* 138 140  K 4.1 6.8* 4.9  CL 95* 98 103  CO2 21 18*  22  GLUCOSE 244* 290* 157*  BUN 20 22 20   CREATININE 1.59* 1.38* 1.22  CALCIUM 9.8 9.3 9.2   Liver Function Tests:  Recent Labs Lab 12/30/13 2141 12/31/13 0550  AST 14 13  ALT 13 14  ALKPHOS 114 108  BILITOT  0.4 0.3  PROT 8.5* 8.5*  ALBUMIN 4.6 4.5   No results for input(s): LIPASE, AMYLASE in the last 168 hours. No results for input(s): AMMONIA in the last 168 hours. CBC:  Recent Labs Lab 12/30/13 2141 12/31/13 0952  WBC 13.5* 13.3*  NEUTROABS 10.2* 10.9*  HGB 14.0 12.4*  HCT 41.7 36.2*  MCV 79.7 79.6  PLT 332 PLATELET CLUMPS NOTED ON SMEAR, UNABLE TO ESTIMATE   Cardiac Enzymes:  Recent Labs Lab 12/30/13 2327  TROPONINI <0.30   BNP (last 3 results) No results for input(s): PROBNP in the last 8760 hours. CBG:  Recent Labs Lab 12/30/13 2107 12/31/13 0321 12/31/13 0738 12/31/13 1108  GLUCAP 184* 305* 239* 142*    No results found for this or any previous visit (from the past 240 hour(s)).        Studies: Dg Abd Acute W/chest  12/30/2013   CLINICAL DATA:  Vomiting and abdominal pain  EXAM: ACUTE ABDOMEN SERIES (ABDOMEN 2 VIEW & CHEST 1 VIEW)  COMPARISON:  11/16/2013  FINDINGS: Cardiac shadow is within normal limits.  The lungs are clear.  Scattered large and small bowel gas is noted. Fecal material is noted throughout the colon. No obstructive changes are seen. No abnormal mass or abnormal calcifications are noted. No free air is seen.  IMPRESSION: Moderate fecal burden.  No acute abdominal abnormality is noted.   Electronically Signed   By: Inez Catalina M.D.   On: 12/30/2013 23:49        Scheduled Meds: . clindamycin (CLEOCIN) IV  300 mg Intravenous 4 times per day  . collagenase   Topical Daily  . erythromycin  250 mg Intravenous 4 times per day  . heparin  5,000 Units Subcutaneous 3 times per day  . insulin aspart  0-15 Units Subcutaneous Q4H  . insulin glargine  20 Units Subcutaneous QHS  . lidocaine (PF)  5 mL Infiltration Once  . lidocaine  5 mL Other Once  . metoCLOPramide (REGLAN) injection  10 mg Intravenous 3 times per day  . metoprolol  2.5 mg Intravenous 4 times per day  . sodium chloride  3 mL Intravenous Q12H   Continuous Infusions: .  sodium chloride 100 mL/hr at 12/31/13 0800    Principal Problem:   Gastroparesis Active Problems:   IDDM (insulin dependent diabetes mellitus)   HTN (hypertension)   Constipation   Epigastric pain    Time spent: 40 minutes.    Vernell Leep, MD, FACP, FHM. [facility] Pager [phone]  If 7PM-7AM, please contact night-coverage [email or web address] Password [email or web address] 12/31/2013, 1:41 PM    LOS: 1 day

## 2013-12-31 NOTE — ED Notes (Signed)
Reported to Dr. Winfred Leeds that patient is still vomiting and complaining of pain. MD acknowledges and is entering orders.

## 2013-12-31 NOTE — Clinical Social Work Note (Signed)
CSW received consult regarding patient needing medication assistance. CSW has notified RNCM. CSW signing off.  Liz Beach MSW, [place], [place], [phone]

## 2013-12-31 NOTE — Progress Notes (Signed)
12/31/13 Patient continues to be nauseated med given for relief with affect.

## 2013-12-31 NOTE — ED Notes (Signed)
Discussed vomiting with Dr. Winfred Leeds. He acknowledges, no new orders at this time

## 2013-12-31 NOTE — Consult Note (Addendum)
WOC wound consult note Reason for Consult: Consult requested for right great toe.  Pt states it started as a blister a few weeks ago and has progressed to a dry scab.  He has been taking antibiotics and was told to follow-up with the outpatient wound care clinic.  He says they would not accept him as a patient since he does not have insurance. Wound type: Right great toe with full thickness diabetic wound Measurement: 5X3cm Wound bed: 100% dry eschar, no fluctuance or pain Drainage (amount, consistency, odor) No odor or drainage Periwound: Intact skin surrounding, some loose peeling skin Dressing procedure/placement/frequency: Santyl to chemically debride nonviable tissue.  Pt could benefit from follow-up at the [facility] clinic for follow-up assessment after discharge and sharp debridement once the nonviable tissue has softened. Please arrange if desired. Discussed plan of care with patient and he denies further questions and verbalizes understanding. Please re-consult if further assistance is needed.  Thank-you,  Julien Girt MSN, [place], [place], [place], [place]

## 2013-12-31 NOTE — H&P (Signed)
[facility] History and Physical  Patient: Duane [name]  RWE:[id number]  DOB: October 02, 1975  DOS: the patient was seen and examined on 12/31/2013 PCP: [name], MD  Chief Complaint: abdominal pain nausea and vomiting  HPI: Duane [name] is a 38 y.o. male with Past medical history of diabetes mellitus, diabetic gastropathy with recurrent flare, gastritis, hypertension. The patient is presenting with complaints of nausea and vomiting that started yesterday. He mentions that he was at his baseline and then suddenly started having this episode of nausea and vomiting which was not getting better and he continues to have this vomiting despite taking his gastroparesis medication and therefore he came to the ER. He denies any fever or chills denies any chest pain complains of acid reflux. Denies any diarrhea. Denies any burning urination. He mentions he gets daily bowel movements. He denies any focal deficit. He mentions he is compliant with all his medications. 10 days ago he started having a blister on his right great toe, blister ruptured and turned into a scab. He saw his PCP who started him on Bactrim as an outpatient and recommended to follow-up with wound care center. Patient has not been able to see wound care center. Denies any pain at the toe joint area  The patient is coming from home. And at his baseline independent for most of his ADL.  Review of Systems: as mentioned in the history of present illness.  A Comprehensive review of the other systems is negative.  Past Medical History  Diagnosis Date  . Hypertension   . Diabetes mellitus without complication age 57    insulin requiring from the start  . Gastritis   . Gastroparesis 2012    100% gastric retention on 08/2013 [facility]  . Obesity   . Diabetic neuropathy    Past Surgical History  Procedure Laterality Date  . Cholecystectomy  2013  . Knee surgery Left   . Flexible sigmoidoscopy N/A 06/11/2013    Procedure:  FLEXIBLE SIGMOIDOSCOPY;  Surgeon: [name], MD;  Location: [facility];  Service: Endoscopy;  Laterality: N/A;  . Esophagogastroduodenoscopy N/A 06/11/2013    Procedure: ESOPHAGOGASTRODUODENOSCOPY (EGD);  Surgeon: [name], MD;  Location: [facility];  Service: Endoscopy;  Laterality: N/A;   Social History:  reports that he has never smoked. He has never used smokeless tobacco. He reports that he drinks alcohol. He reports that he does not use illicit drugs.  No Known Allergies  Family History  Problem Relation Age of Onset  . Diabetes Father     Prior to Admission medications   Medication Sig Start Date End Date Taking? Authorizing Provider  acetaminophen (TYLENOL) 500 MG tablet Take 500 mg by mouth every 6 (six) hours as needed for mild pain.    Yes Historical Provider, MD  amLODipine (NORVASC) 10 MG tablet Take 1 tablet (10 mg total) by mouth every morning. 08/29/13  Yes Venetia Maxon Rama, MD  cloNIDine (CATAPRES) 0.1 MG tablet Take 1 tablet (0.1 mg total) by mouth 2 (two) times daily. 08/29/13  Yes Christina P Rama, MD  insulin aspart (NOVOLOG) 100 UNIT/ML injection Inject 9-12 Units into the skin 3 (three) times daily before meals. SSI 08/29/13  Yes Christina P Rama, MD  insulin glargine (LANTUS) 100 UNIT/ML injection Inject 32 Units into the skin at bedtime.   Yes Historical Provider, MD  metoCLOPramide (REGLAN) 10 MG tablet Take 10 mg by mouth 4 (four) times daily.   Yes Historical Provider, MD  metoprolol (LOPRESSOR) 50 MG  tablet Take 50 mg by mouth 2 (two) times daily.  08/20/13  Yes Historical Provider, MD  pantoprazole (PROTONIX) 40 MG tablet Take 1 tablet (40 mg total) by mouth every morning. 08/29/13  Yes Christina P Rama, MD  sucralfate (CARAFATE) 1 G tablet Take 1 g by mouth 4 (four) times daily -  before meals and at bedtime.   Yes Historical Provider, MD  sulfamethoxazole-trimethoprim (BACTRIM DS,SEPTRA DS) 800-160 MG per tablet Take 1 tablet by mouth 2 (two) times  daily. 10 day treatment beginning 12/22/13 for foot infection   Yes Historical Provider, MD  traMADol (ULTRAM) 50 MG tablet Take 50 mg by mouth every 6 (six) hours as needed for moderate pain.  08/19/13  Yes Historical Provider, MD  erythromycin ethylsuccinate (EES) 200 MG/5ML suspension Take 10 mLs (400 mg total) by mouth every 8 (eight) hours. 10/06/13   [name], MD  gabapentin (NEURONTIN) 300 MG capsule Take 1 capsule (300 mg total) by mouth at bedtime. 08/29/13   Venetia Maxon Rama, MD  glucose blood (ONE TOUCH ULTRA TEST) test strip Use as instructed to check blood sugar once a day dx code 250.00 10/08/13   Elayne Snare, MD  HYDROcodone-acetaminophen (NORCO/VICODIN) 5-325 MG per tablet Take 1 tablet by mouth every 8 (eight) hours as needed for moderate pain. 10/06/13   [name], MD  insulin glargine (LANTUS) 100 UNIT/ML injection Inject 0.1 mLs (10 Units total) into the skin at bedtime. 10/06/13   [name], MD  LORazepam (ATIVAN) 1 MG tablet Take 1 tablet (1 mg total) by mouth 3 (three) times daily as needed (nausea, anxiety). 10/23/13   Kalman Drape, MD  NOVOLOG FLEXPEN 100 UNIT/ML FlexPen USE AS DIRECTED. USE 1 TO 8 UNITS WITH MEALS AS NEEDED ACCORDING TO SLIDING SCALE 11/13/13   Elayne Snare, MD  [facility] DELICA LANCETS FINE MISC Use one per day dx code 250.00 10/08/13   Elayne Snare, MD  polyethylene glycol (MIRALAX / GLYCOLAX) packet Take 17 g by mouth 2 (two) times daily. 10/06/13   [name], MD  promethazine (PHENERGAN) 25 MG suppository Place 1 suppository (25 mg total) rectally every 6 (six) hours as needed for nausea or vomiting. 10/06/13   [name], MD  promethazine (PHENERGAN) 25 MG tablet Take 1 tablet (25 mg total) by mouth every 8 (eight) hours as needed for nausea or vomiting. 09/21/13   Brent General, PA-C    Physical Exam: Filed Vitals:   12/31/13 0145 12/31/13 0200 12/31/13 0223 12/31/13 0300  BP: 116/64 111/66 153/87 187/93  Pulse:  99 98 98   Temp:    98.4 F (36.9 C)  TempSrc:    Oral  Resp: _0 Height:    _1  (1.905 m)  Weight:    120.5 kg (265 lb 10.5 oz)  SpO2: 96% 98% 97% 100%    General: Alert, Awake and Oriented to Time, Place and Person. Appear in mild distress Eyes: PERRL ENT: Oral Mucosa clear moist. Neck: no JVD Cardiovascular: S1 and S2 Present, no Murmur, Peripheral Pulses Present Respiratory: Bilateral Air entry equal and Decreased, Clear to Auscultation, noCrackles, no wheezes Abdomen: Bowel Sound present, Soft and diffuse tender Skin: no Rash Extremities: no Pedal edema, no calf tenderness Scab with healing ulcer on the toe Neurologic: Grossly no focal neuro deficit.  Labs on Admission:  CBC:  Recent Labs Lab 12/30/13 2141  WBC 13.5*  NEUTROABS 10.2*  HGB 14.0  HCT 41.7  MCV 79.7  PLT  332    CMP     Component Value Date/Time   NA 136* 12/30/2013 2141   K 4.1 12/30/2013 2141   CL 95* 12/30/2013 2141   CO2 21 12/30/2013 2141   GLUCOSE 244* 12/30/2013 2141   BUN 20 12/30/2013 2141   CREATININE 1.59* 12/30/2013 2141   CALCIUM 9.8 12/30/2013 2141   PROT 8.5* 12/30/2013 2141   ALBUMIN 4.6 12/30/2013 2141   AST 14 12/30/2013 2141   ALT 13 12/30/2013 2141   ALKPHOS 114 12/30/2013 2141   BILITOT 0.4 12/30/2013 2141   GFRNONAA 54* 12/30/2013 2141   GFRAA 63* 12/30/2013 2141    No results for input(s): LIPASE, AMYLASE in the last 168 hours. No results for input(s): AMMONIA in the last 168 hours.   Recent Labs Lab 12/30/13 2327  TROPONINI <0.30   BNP (last 3 results) No results for input(s): PROBNP in the last 8760 hours.  Radiological Exams on Admission: Dg Abd Acute W/chest  12/30/2013   CLINICAL DATA:  Vomiting and abdominal pain  EXAM: ACUTE ABDOMEN SERIES (ABDOMEN 2 VIEW & CHEST 1 VIEW)  COMPARISON:  11/16/2013  FINDINGS: Cardiac shadow is within normal limits.  The lungs are clear.  Scattered large and small bowel gas is noted. Fecal material is noted  throughout the colon. No obstructive changes are seen. No abnormal mass or abnormal calcifications are noted. No free air is seen.  IMPRESSION: Moderate fecal burden.  No acute abdominal abnormality is noted.   Electronically Signed   By: Inez Catalina M.D.   On: 12/30/2013 23:49    Assessment/Plan Principal Problem:   Gastroparesis Active Problems:   IDDM (insulin dependent diabetes mellitus)   HTN (hypertension)   Constipation   Epigastric pain   1. Gastroparesis The patient is presenting with complaints of nausea and vomiting with abdominal pain. Most likely represent flareup of his gastroparesis. In the past patient has been presented with similar complaints and has been started on IV azithromycin which I would also start him on along with having continue with IV Reglan and IV Zofran as well as IV Phenergan. Patient has been requesting pain medication which I would provide him IV morphine and low-dose. In the past patient has been found to be more lethargic with Phenergan and Dilaudid combination. IV normal saline with potassium will be provided. Due to his recurrent nausea and vomiting he appears to have developed dehydration and acidosis I would closely monitor him since he has diabetes and a potential to be converted into diabetic ketoacidosis.  2.toe ulcer. At present healing well. Patient is on Bactrim as an outpatient. I would give him clindamycin here. ESR CRP and wound care consult.  3.diabetes mellitus. Blood sugars are elevated. Patient will remain on insulin Lantus at half dose as well as every 4 hours CBG and sliding scale.  4.constipation. In the past patient has been started on Miralex. We will closely monitor and resume them once he can tolerate orally.  5. Accelerated hypertension. Patient's blood pressure mildly elevated at present. Will use IV hydralazine as well as IV Lopressor.  Advance goals of care discussion: full code   DVT Prophylaxis: subcutaneous  Heparin Nutrition: npo  Disposition: Admitted to inpatient in telemetry unit.  Author: Berle Mull, MD [facility] Pager: [phone] 12/31/2013, 5:47 AM    If 7PM-7AM, please contact night-coverage [email or web address] Password [email or web address]

## 2013-12-31 NOTE — Care Management (Signed)
Utilization review completed.  

## 2013-12-31 NOTE — Plan of Care (Signed)
Problem: Phase I Progression Outcomes Goal: Voiding-avoid urinary catheter unless indicated Outcome: Completed/Met Date Met:  12/31/13     

## 2014-01-01 DIAGNOSIS — K3184 Gastroparesis: Secondary | ICD-10-CM

## 2014-01-01 DIAGNOSIS — R1013 Epigastric pain: Secondary | ICD-10-CM

## 2014-01-01 DIAGNOSIS — E108 Type 1 diabetes mellitus with unspecified complications: Secondary | ICD-10-CM

## 2014-01-01 DIAGNOSIS — IMO0002 Reserved for concepts with insufficient information to code with codable children: Secondary | ICD-10-CM | POA: Insufficient documentation

## 2014-01-01 DIAGNOSIS — E1069 Type 1 diabetes mellitus with other specified complication: Secondary | ICD-10-CM

## 2014-01-01 DIAGNOSIS — E1065 Type 1 diabetes mellitus with hyperglycemia: Secondary | ICD-10-CM | POA: Insufficient documentation

## 2014-01-01 DIAGNOSIS — E1143 Type 2 diabetes mellitus with diabetic autonomic (poly)neuropathy: Secondary | ICD-10-CM

## 2014-01-01 DIAGNOSIS — I10 Essential (primary) hypertension: Secondary | ICD-10-CM | POA: Insufficient documentation

## 2014-01-01 LAB — CBC
HEMATOCRIT: 41.6 % (ref 39.0–52.0)
HEMOGLOBIN: 13.9 g/dL (ref 13.0–17.0)
MCH: 27.1 pg (ref 26.0–34.0)
MCHC: 33.4 g/dL (ref 30.0–36.0)
MCV: 81.3 fL (ref 78.0–100.0)
Platelets: 328 10*3/uL (ref 150–400)
RBC: 5.12 MIL/uL (ref 4.22–5.81)
RDW: 12.9 % (ref 11.5–15.5)
WBC: 11.1 10*3/uL — ABNORMAL HIGH (ref 4.0–10.5)

## 2014-01-01 LAB — BASIC METABOLIC PANEL
Anion gap: 17 — ABNORMAL HIGH (ref 5–15)
BUN: 16 mg/dL (ref 6–23)
CALCIUM: 9.4 mg/dL (ref 8.4–10.5)
CO2: 22 mEq/L (ref 19–32)
CREATININE: 1.15 mg/dL (ref 0.50–1.35)
Chloride: 101 mEq/L (ref 96–112)
GFR calc Af Amer: >90 mL/min (ref >90)
GFR, EST NON AFRICAN AMERICAN: 80 mL/min — AB (ref >90)
GLUCOSE: 199 mg/dL — AB (ref 70–99)
Potassium: 4.4 mEq/L (ref 3.7–5.3)
Sodium: 140 mEq/L (ref 137–147)

## 2014-01-01 LAB — GLUCOSE, CAPILLARY
GLUCOSE-CAPILLARY: 158 mg/dL — AB (ref 70–99)
GLUCOSE-CAPILLARY: 165 mg/dL — AB (ref 70–99)
Glucose-Capillary: 188 mg/dL — ABNORMAL HIGH (ref 70–99)
Glucose-Capillary: 172 mg/dL — ABNORMAL HIGH (ref 70–99)
Glucose-Capillary: 175 mg/dL — ABNORMAL HIGH (ref 70–99)
Glucose-Capillary: 159 mg/dL — ABNORMAL HIGH (ref 70–99)
Glucose-Capillary: 137 mg/dL — ABNORMAL HIGH (ref 70–99)

## 2014-01-01 MED ORDER — LORAZEPAM 2 MG/ML IJ SOLN
0.5000 mg | Freq: Four times a day (QID) | INTRAMUSCULAR | Status: DC | PRN
  Administered 2014-01-01 – 2014-01-04 (×3): 0.5 mg via INTRAVENOUS
  Filled 2014-01-01 (×4): qty 1

## 2014-01-01 MED ORDER — GLYCERIN (LAXATIVE) 2.1 G RE SUPP
1.0000 | Freq: Two times a day (BID) | RECTAL | Status: DC
  Administered 2014-01-01 – 2014-01-09 (×2): 1 via RECTAL
  Filled 2014-01-01 (×19): qty 1

## 2014-01-01 MED ORDER — SUCRALFATE 1 GM/10ML PO SUSP
1.0000 g | Freq: Four times a day (QID) | ORAL | Status: DC
  Administered 2014-01-02 – 2014-01-10 (×19): 1 g via ORAL
  Filled 2014-01-01 (×38): qty 10

## 2014-01-01 MED ORDER — PANTOPRAZOLE SODIUM 40 MG IV SOLR
40.0000 mg | Freq: Two times a day (BID) | INTRAVENOUS | Status: DC
  Administered 2014-01-01 – 2014-01-05 (×8): 40 mg via INTRAVENOUS
  Filled 2014-01-01 (×9): qty 40

## 2014-01-01 MED ORDER — SODIUM CHLORIDE 0.9 % IV SOLN
INTRAVENOUS | Status: AC
  Administered 2014-01-01 – 2014-01-02 (×2): via INTRAVENOUS

## 2014-01-01 MED ORDER — METOPROLOL TARTRATE 1 MG/ML IV SOLN
5.0000 mg | Freq: Four times a day (QID) | INTRAVENOUS | Status: DC
Start: 2014-01-01 — End: 2014-01-05
  Administered 2014-01-01 – 2014-01-05 (×13): 5 mg via INTRAVENOUS
  Filled 2014-01-01 (×19): qty 5

## 2014-01-01 NOTE — Progress Notes (Addendum)
PROGRESS NOTE    Duane [name] [id number] DOB: [birth year] DOA: 12/30/2013 PCP: [name], MD  HPI/Brief narrative 38 year old male with history of IDDM with neuropathy, diabetic gastroparesis with recurrent flare, essential hypertension, presented with subacute onset of nausea, vomiting and epigastric pain that started night prior to admission. He was treated by PCP for right great toe wound infection with Bactrim.   Assessment/Plan:  1. Diabetic gastroparesis: Patient was hospitalized in September 2015 for intractable nausea and vomiting attributed to diabetic gastroparesis. Gastroparesis was confirmed by gastric emptying scan on 08/18/13. GI had been consulted during that admission. Patient kept asking for IV Dilaudid despite advise to reduce by MDs. He was treated empirically with IV Reglan, PPI, erythromycin and antiemetics. Finally he was transferred to [facility] for further management. Patient states that he has been fine with no complaints until this admission. Treated supportively with bowel rest, IV Reglan (brief), IV Erthythromycin (brief) and when necessary Zofran. We'll minimize opioids. (Opioid seeking behavior as per previous admission notes.) . Advance diet gradually as tolerated. Has moderate stool burden by x-ray-laxatives. Although patient states that he feels some improvement, continues to have nausea and vomiting. Thereby [facility] consulted and appreciate input. We'll increase PPI to twice a day. 2. Dehydration: Secondary to problem #1. Continue IV fluids while nothing by mouth and ongoing GI losses. 3. Acute renal failure: Likely prerenal from dehydration.Continue IV fluids. Resolved.  4. Uncontrolled type I DM with peripheral neuropathy and gastroparesis: Hemoglobin A1c in August was 9.3 suggesting poor control. Continue Lantus and SSI. Reasonable inpatient control. 5. Essential hypertension: Mildly uncontrolled. Continue scheduled IV metoprolol and IV  hydralazine when necessary. Fluctuating BP secondary to ongoing intermittent pain. We will increase IV scheduled metoprolol. 6. Right great toe wound infection: Clinically improved on OP Bactrim-continue same and DC clindamycin. Wound care consultation appreciated -recommend follow-up at [facility] for follow-up assessment and sharp debridement once the nonviable tissue has softened .     Code Status: Full Family Communication: None at bedside.  Disposition Plan: Home when medically stable. Not medically stable for discharge.   Consultants:  Gastroenterology  Procedures:  None   Antibiotics:  Clindamycin-DC   Subjective: Patient states that he feels about 30% better but still has ongoing nausea, retching and non-bloody emesis. No BMs. Continues to complain of abdominal pain and requests increasing the dose of opioids.  Objective: Filed Vitals:   01/01/14 0440 01/01/14 0444 01/01/14 1326 01/01/14 1327  BP: 172/87 172/87  157/89  Pulse: 122 120    Temp: 100 F (37.8 C) 100 F (37.8 C) 98.9 F (37.2 C)   TempSrc: Axillary Oral Oral   Resp: 15 15  16   Height:      Weight: 119.6 kg (263 lb 10.7 oz)     SpO2: 98% 98%  98%    Intake/Output Summary (Last 24 hours) at 01/01/14 1610 Last data filed at 01/01/14 0700  Gross per 24 hour  Intake 2086.67 ml  Output    775 ml  Net 1311.67 ml   Filed Weights   12/31/13 0300 01/01/14 0440  Weight: 120.5 kg (265 lb 10.5 oz) 119.6 kg (263 lb 10.7 oz)     Exam:  General exam: Pleasant young male lying comfortably in bed.  Respiratory system: Clear. No increased work of breathing. Cardiovascular system: S1 & S2 heard, RRR. No JVD, murmurs, gallops, clicks or pedal edema. Gastrointestinal system: Abdomen is nondistended, soft and nontender. Normal bowel sounds heard. Central nervous system:  Alert and oriented. No focal neurological deficits. Extremities: Symmetric 5 x 5 power.   Data Reviewed: Basic  Metabolic Panel:  Recent Labs Lab 12/30/13 2141 12/31/13 0550 12/31/13 0952 01/01/14 0542  NA 136* 138 140 140  K 4.1 6.8* 4.9 4.4  CL 95* 98 103 101  CO2 21 18* 22 22  GLUCOSE 244* 290* 157* 199*  BUN 20 22 20 16   CREATININE 1.59* 1.38* 1.22 1.15  CALCIUM 9.8 9.3 9.2 9.4   Liver Function Tests:  Recent Labs Lab 12/30/13 2141 12/31/13 0550  AST 14 13  ALT 13 14  ALKPHOS 114 108  BILITOT 0.4 0.3  PROT 8.5* 8.5*  ALBUMIN 4.6 4.5   No results for input(s): LIPASE, AMYLASE in the last 168 hours. No results for input(s): AMMONIA in the last 168 hours. CBC:  Recent Labs Lab 12/30/13 2141 12/31/13 0952 01/01/14 0542  WBC 13.5* 13.3* 11.1*  NEUTROABS 10.2* 10.9*  --   HGB 14.0 12.4* 13.9  HCT 41.7 36.2* 41.6  MCV 79.7 79.6 81.3  PLT 332 PLATELET CLUMPS NOTED ON SMEAR, UNABLE TO ESTIMATE 328   Cardiac Enzymes:  Recent Labs Lab 12/30/13 2327  TROPONINI <0.30   BNP (last 3 results) No results for input(s): PROBNP in the last 8760 hours. CBG:  Recent Labs Lab 12/31/13 2038 12/31/13 2350 01/01/14 0336 01/01/14 0752 01/01/14 1149  GLUCAP 163* 189* 188* 172* 175*    No results found for this or any previous visit (from the past 240 hour(s)).        Studies: Dg Abd Acute W/chest  12/30/2013   CLINICAL DATA:  Vomiting and abdominal pain  EXAM: ACUTE ABDOMEN SERIES (ABDOMEN 2 VIEW & CHEST 1 VIEW)  COMPARISON:  11/16/2013  FINDINGS: Cardiac shadow is within normal limits.  The lungs are clear.  Scattered large and small bowel gas is noted. Fecal material is noted throughout the colon. No obstructive changes are seen. No abnormal mass or abnormal calcifications are noted. No free air is seen.  IMPRESSION: Moderate fecal burden.  No acute abdominal abnormality is noted.   Electronically Signed   By: Inez Catalina M.D.   On: 12/30/2013 23:49        Scheduled Meds: . collagenase   Topical Daily  . erythromycin  250 mg Intravenous 4 times per day  .  gabapentin  300 mg Oral QHS  . Glycerin (Adult)  1 suppository Rectal BID  . heparin  5,000 Units Subcutaneous 3 times per day  . insulin aspart  0-15 Units Subcutaneous Q4H  . insulin glargine  20 Units Subcutaneous QHS  . metoCLOPramide (REGLAN) injection  10 mg Intravenous 3 times per day  . metoprolol  2.5 mg Intravenous 4 times per day  . pantoprazole (PROTONIX) IV  40 mg Intravenous QHS  . polyethylene glycol  17 g Oral BID  . senna  2 tablet Oral BID  . sodium chloride  3 mL Intravenous Q12H  . sulfamethoxazole-trimethoprim  1 tablet Oral BID   Continuous Infusions:    Principal Problem:   Gastroparesis Active Problems:   IDDM (insulin dependent diabetes mellitus)   HTN (hypertension)   Constipation   Epigastric pain    Time spent: 40 minutes.    Vernell Leep, MD, FACP, FHM. [facility] Pager [phone]  If 7PM-7AM, please contact night-coverage [email or web address] Password [email or web address] 01/01/2014, 4:10 PM    LOS: 2 days

## 2014-01-01 NOTE — Consult Note (Signed)
Referring Provider: Dr Algis Liming, [facility] Primary Care Physician:  [name], MD Primary Gastroenterologist:  unassigned  Reason for Consultation:   Nausea, vomiting, gastroparesis  HPI: Duane [name] is a 38 y.o. male who has been a type I diabetic since age 54. He has a history of diabetic gastroparesis which was diagnosed in [place] around 2012 per the patient.  He had 2 admissions in April of this year for the management of nausea and vomiting. His initial inpatient GI evaluation was by Dr. Henrene Pastor in April 2015. On CT scan of the abdomen and pelvis there was a suggestion of transverse colitis. He had an EGD and flexible sigmoidoscopy on 06/11/2013. These were benign and recommendations were for the medical management of gastroparesis. He had a gastric emptying study 08/18/2013 which confirmed known gastroparesis 100% retention at 1 and 2 hour intervals his hemoglobin A1c at that time was 9.2 and in August his hemoglobin A1c's were ranging from 9-9.3. His baseline GI meds include oral Phenergan as needed, Reglan 4 times daily, Protonix, and Carafate. He is on tramadol as needed as well as Norco and Neurontin. He was admitted in August and plain films of the abdomen revealed a moderate stool per day which resolved with smog enemas. He was admitted to [facility] in early September of this year with recurrent nausea and vomiting, and was evaluated by Dr. Penelope Coop of [facility]. He was subsequently transferred to [facility] for definitive care.  While at [facility], on 10/20/2013 he underwent an EGD which revealed linear erythema of the distal 5 cm of the esophagus with appearance typical of reflux esophagitis. His hemoglobin A1c on 10/19/2013 was 8.6. He was discharged from [facility] on 10/22/2013. The patient's apparently discussed evaluation for gastric pacemaker with GI at [facility]. He was advised to call to make an appointment to be seen at their  clinic in 4 weeks however patient does not have this point. The patient was advised to adhere to small frequent meals and avoid spicy foods. He states he was doing fairly well until this past Wednesday when he began to develop upper abdominal pain nausea and vomiting. He has been unable to hold food down. He states he has been constipated as well. He had had some french fries with other food Tuesday evening, but Wednesday had had soup and after that he began vomiting.   Past Medical History  Diagnosis Date  . Hypertension   . Diabetes mellitus without complication age 51    insulin requiring from the start  . Gastritis   . Gastroparesis 2012    100% gastric retention on 08/2013 [facility]  . Obesity   . Diabetic neuropathy     Past Surgical History  Procedure Laterality Date  . Cholecystectomy  2013  . Knee surgery Left   . Flexible sigmoidoscopy N/A 06/11/2013    Procedure: FLEXIBLE SIGMOIDOSCOPY;  Surgeon: [name], MD;  Location: [facility] ENDOSCOPY;  Service: Endoscopy;  Laterality: N/A;  . Esophagogastroduodenoscopy N/A 06/11/2013    Procedure: ESOPHAGOGASTRODUODENOSCOPY (EGD);  Surgeon: [name], MD;  Location: [facility];  Service: Endoscopy;  Laterality: N/A;    Prior to Admission medications   Medication Sig Start Date End Date Taking? Authorizing Provider  acetaminophen (TYLENOL) 500 MG tablet Take 500 mg by mouth every 6 (six) hours as needed for mild pain.    Yes Historical Provider, MD  amLODipine (NORVASC) 10 MG tablet Take 1 tablet (10 mg total) by mouth  every morning. 08/29/13  Yes Venetia Maxon Rama, MD  cloNIDine (CATAPRES) 0.1 MG tablet Take 1 tablet (0.1 mg total) by mouth 2 (two) times daily. 08/29/13  Yes Christina P Rama, MD  insulin aspart (NOVOLOG) 100 UNIT/ML injection Inject 9-12 Units into the skin 3 (three) times daily before meals. SSI 08/29/13  Yes Christina P Rama, MD  insulin glargine (LANTUS) 100 UNIT/ML injection Inject 32 Units into the skin at bedtime.    Yes Historical Provider, MD  metoCLOPramide (REGLAN) 10 MG tablet Take 10 mg by mouth 4 (four) times daily.   Yes Historical Provider, MD  metoprolol (LOPRESSOR) 50 MG tablet Take 50 mg by mouth 2 (two) times daily.  08/20/13  Yes Historical Provider, MD  pantoprazole (PROTONIX) 40 MG tablet Take 1 tablet (40 mg total) by mouth every morning. 08/29/13  Yes Christina P Rama, MD  sucralfate (CARAFATE) 1 G tablet Take 1 g by mouth 4 (four) times daily -  before meals and at bedtime.   Yes Historical Provider, MD  sulfamethoxazole-trimethoprim (BACTRIM DS,SEPTRA DS) 800-160 MG per tablet Take 1 tablet by mouth 2 (two) times daily. 10 day treatment beginning 12/22/13 for foot infection   Yes Historical Provider, MD  traMADol (ULTRAM) 50 MG tablet Take 50 mg by mouth every 6 (six) hours as needed for moderate pain.  08/19/13  Yes Historical Provider, MD  erythromycin ethylsuccinate (EES) 200 MG/5ML suspension Take 10 mLs (400 mg total) by mouth every 8 (eight) hours. 10/06/13   [name], MD  gabapentin (NEURONTIN) 300 MG capsule Take 1 capsule (300 mg total) by mouth at bedtime. 08/29/13   Venetia Maxon Rama, MD  glucose blood (ONE TOUCH ULTRA TEST) test strip Use as instructed to check blood sugar once a day dx code 250.00 10/08/13   Elayne Snare, MD  HYDROcodone-acetaminophen (NORCO/VICODIN) 5-325 MG per tablet Take 1 tablet by mouth every 8 (eight) hours as needed for moderate pain. 10/06/13   [name], MD  insulin glargine (LANTUS) 100 UNIT/ML injection Inject 0.1 mLs (10 Units total) into the skin at bedtime. 10/06/13   [name], MD  LORazepam (ATIVAN) 1 MG tablet Take 1 tablet (1 mg total) by mouth 3 (three) times daily as needed (nausea, anxiety). 10/23/13   Kalman Drape, MD  NOVOLOG FLEXPEN 100 UNIT/ML FlexPen USE AS DIRECTED. USE 1 TO 8 UNITS WITH MEALS AS NEEDED ACCORDING TO SLIDING SCALE 11/13/13   Elayne Snare, MD  [facility] DELICA LANCETS FINE MISC Use one per day dx code 250.00  10/08/13   Elayne Snare, MD  polyethylene glycol (MIRALAX / GLYCOLAX) packet Take 17 g by mouth 2 (two) times daily. 10/06/13   [name], MD  promethazine (PHENERGAN) 25 MG suppository Place 1 suppository (25 mg total) rectally every 6 (six) hours as needed for nausea or vomiting. 10/06/13   [name], MD  promethazine (PHENERGAN) 25 MG tablet Take 1 tablet (25 mg total) by mouth every 8 (eight) hours as needed for nausea or vomiting. 09/21/13   Brent General, PA-C    Current Facility-Administered Medications  Medication Dose Route Frequency Provider Last Rate Last Dose  . acetaminophen (TYLENOL) tablet 650 mg  650 mg Oral Q6H PRN Modena Jansky, MD       Or  . acetaminophen (TYLENOL) suppository 650 mg  650 mg Rectal Q6H PRN Modena Jansky, MD      . collagenase (SANTYL) ointment   Topical Daily Modena Jansky, MD      .  erythromycin 250 mg in sodium chloride 0.9 % 100 mL IVPB  250 mg Intravenous 4 times per day Berle Mull, MD   250 mg at 01/01/14 1358  . gabapentin (NEURONTIN) capsule 300 mg  300 mg Oral QHS Modena Jansky, MD   300 mg at 01/01/14 0031  . heparin injection 5,000 Units  5,000 Units Subcutaneous 3 times per day Berle Mull, MD   5,000 Units at 01/01/14 1358  . hydrALAZINE (APRESOLINE) injection 10 mg  10 mg Intravenous Q4H PRN Modena Jansky, MD      . insulin aspart (novoLOG) injection 0-15 Units  0-15 Units Subcutaneous Q4H Berle Mull, MD   3 Units at 01/01/14 1257  . insulin glargine (LANTUS) injection 20 Units  20 Units Subcutaneous QHS Berle Mull, MD   20 Units at 01/01/14 0017  . LORazepam (ATIVAN) injection 0.5 mg  0.5 mg Intravenous Q6H PRN Modena Jansky, MD      . metoCLOPramide (REGLAN) injection 10 mg  10 mg Intravenous 3 times per day Berle Mull, MD   10 mg at 01/01/14 1358  . metoprolol (LOPRESSOR) injection 2.5 mg  2.5 mg Intravenous 4 times per day Berle Mull, MD   2.5 mg at 01/01/14 1256  . morphine 2 MG/ML injection  1 mg  1 mg Intravenous Q4H PRN Modena Jansky, MD   1 mg at 01/01/14 1215  . ondansetron (ZOFRAN) tablet 4 mg  4 mg Oral Q6H PRN Berle Mull, MD       Or  . ondansetron (ZOFRAN) 8 mg/NS 50 ml IVPB  8 mg Intravenous Q6H PRN Berle Mull, MD   8 mg at 01/01/14 0949  . pantoprazole (PROTONIX) injection 40 mg  40 mg Intravenous QHS Modena Jansky, MD   40 mg at 01/01/14 0024  . polyethylene glycol (MIRALAX / GLYCOLAX) packet 17 g  17 g Oral BID Modena Jansky, MD   17 g at 01/01/14 0031  . promethazine (PHENERGAN) injection 12.5 mg  12.5 mg Intravenous Q6H PRN Modena Jansky, MD   12.5 mg at 01/01/14 1216  . senna (SENOKOT) tablet 17.2 mg  2 tablet Oral BID Modena Jansky, MD   17.2 mg at 01/01/14 0032  . sodium chloride 0.9 % injection 3 mL  3 mL Intravenous Q12H Berle Mull, MD   3 mL at 01/01/14 0032  . sulfamethoxazole-trimethoprim (BACTRIM DS,SEPTRA DS) 800-160 MG per tablet 1 tablet  1 tablet Oral BID Modena Jansky, MD   1 tablet at 01/01/14 0033    Allergies as of 12/30/2013  . (No Known Allergies)    Family History  Problem Relation Age of Onset  . Diabetes Father     History   Social History  . Marital Status: Single    Spouse Name: N/A    Number of Children: N/A  . Years of Education: N/A   Occupational History  . 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    Social History Main Topics  . Smoking status: Never Smoker   . Smokeless tobacco: Never Used  . Alcohol Use: Yes     Comment: "none really" - 08/27/13  . Drug Use: No  . Sexual Activity: Yes   Other Topics Concern  . Not on file   Social History Narrative    Review of Systems: Gen: Denies any fever, chills, sweats, anorexia, fatigue, weakness, malaise, weight loss, and sleep disorder CV: Denies  chest pain, angina, palpitations, syncope, orthopnea, PND, peripheral edema, and claudication. Resp: Denies dyspnea at rest, dyspnea with exercise, cough, sputum, wheezing,  coughing up blood, and pleurisy. GI: Denies vomiting blood, jaundice, and fecal incontinence.   Denies dysphagia or odynophagia. GU : Denies urinary burning, blood in urine, urinary frequency, urinary hesitancy, nocturnal urination, and urinary incontinence. MS: Denies joint pain, limitation of movement, and swelling, stiffness, low back pain, extremity pain. Denies muscle weakness, cramps, atrophy.  Derm: Has a toe ulcer for which he was on bactrim as an out patient. Psych: Denies depression, anxiety, memory loss, suicidal ideation, hallucinations, paranoia, and confusion. Heme: Denies bruising, bleeding, and enlarged lymph nodes. Neuro:  Denies any headaches, dizziness, paresthesias. Endo:  Denies any problems with DM, thyroid, adrenal function.  Physical Exam: Vital signs in last 24 hours: Temp:  [98.9 F (37.2 C)-100 F (37.8 C)] 98.9 F (37.2 C) (11/20 1326) Pulse Rate:  [110-122] 120 (11/20 0444) Resp:  [15-20] 16 (11/20 1327) BP: (157-172)/(84-101) 157/89 mmHg (11/20 1327) SpO2:  [98 %-99 %] 98 % (11/20 1327) Weight:  [263 lb 10.7 oz (119.6 kg)] 263 lb 10.7 oz (119.6 kg) (11/20 0440) Last BM Date: 12/30/13 General:   Alert,  Well-developed, well-nourished,cooperative in NAD Head:  Normocephalic and atraumatic. Eyes:  Sclera clear, no icterus  Conjunctiva pink. Ears:  Normal auditory acuity. Nose:  No deformity, discharge,  or lesions. Mouth:  No deformity or lesions.   Neck:  Supple; no masses or thyromegaly. Lungs:  Clear throughout to auscultation.     Heart:  Regular rate and rhythm; no murmurs Abdomen:  Soft,mild diffuse tenderness,, BS active,nonpalp mass or hsm.   Rectal:  Deferred  Msk:  Symmetrical without gross deformities. . Pulses:  Normal pulses noted. Extremities:  Without clubbing or edema. Neurologic:  Alert and  oriented x4;  grossly normal neurologically. Skin:  Intact without significant lesions or rashes.. Psych:  Alert and cooperative. Normal mood and  affect.  Intake/Output from previous day: 11/19 0701 - 11/20 0700 In: 2800 [I.V.:2300; IV Piggyback:500] Out: 1275 [Urine:975; Emesis/NG output:300] Intake/Output this shift:    Lab Results:  Recent Labs  12/30/13 2141 12/31/13 0952 01/01/14 0542  WBC 13.5* 13.3* 11.1*  HGB 14.0 12.4* 13.9  HCT 41.7 36.2* 41.6  PLT 332 PLATELET CLUMPS NOTED ON SMEAR, UNABLE TO ESTIMATE 328   BMET  Recent Labs  12/31/13 0550 12/31/13 0952 01/01/14 0542  NA 138 140 140  K 6.8* 4.9 4.4  CL 98 103 101  CO2 18* 22 22  GLUCOSE 290* 157* 199*  BUN 22 20 16   CREATININE 1.38* 1.22 1.15  CALCIUM 9.3 9.2 9.4   LFT  Recent Labs  12/31/13 0550  PROT 8.5*  ALBUMIN 4.5  AST 13  ALT 14  ALKPHOS 108  BILITOT 0.3    Studies/Results: Dg Abd Acute W/chest  12/30/2013   CLINICAL DATA:  Vomiting and abdominal pain  EXAM: ACUTE ABDOMEN SERIES (ABDOMEN 2 VIEW & CHEST 1 VIEW)  COMPARISON:  11/16/2013  FINDINGS: Cardiac shadow is within normal limits.  The lungs are clear.  Scattered large and small bowel gas is noted. Fecal material is noted throughout the colon. No obstructive changes are seen. No abnormal mass or abnormal calcifications are noted. No free air is seen.  IMPRESSION: Moderate fecal burden.  No acute abdominal abnormality is noted.   Electronically Signed   By: Inez Catalina M.D.   On: 12/30/2013 23:49    IMPRESSION/PLAN: 38 year old male with frequent bouts  of nausea, vomiting, and abdominal pain due to gastroparesis, ? compliance to home regimen. EGD in April 2015 showed hiatal hernia, and most recent EGD in September 2015 at [facility] revealed reflux esophagitis. His type 1 diabetes is likely not optimally controlled, and optimization of glycemic control is essential both short and long-term. Would try to avoid or at least minimize opioid pain medications and anticholinergics. He was on Norco and tramadol at home and is currently on morphine. Review of gabapentin on "Epocrates"  describes associated side effects of nausea and vomiting with this medication. Patient feels helps his neuropathy. Question if a" drug vacation" from this medication would be helpful.  Would continue Zofran, Phenergan, Reglan, PPI, and EES. Would use Mira lax twice a day along with glycerin suppositories twice a day to alleviate constipation, and if no relief in the next 2 days consider smog enema. Would consider referral to Dr. Derrill Kay at [facility] for possible gastric pacemaker. Will follow.    Hvozdovic, Deloris Ping 01/01/2014,  Pager [phone] Attending MD note:   I have taken a history, examined the patient, and reviewed the chart. I agree with the Advanced Practitioner's impression and recommendations. Severe gastroparesis, hiccups due to esophagitis. Will add Carafate slurry 10cc po qid. If no improvement in next 24 hour, will insert NG tube for decompression and bowl rest  [facility] Pager # [phone]

## 2014-01-01 NOTE — Care Management Note (Signed)
    Page 1 of 2   01/10/2014     11:48:25 AM CARE MANAGEMENT NOTE 01/10/2014  Patient:  Duane [name], Duane [name]   Account Number:  [id number]  Date Initiated:  01/01/2014  Documentation initiated by:  McNally,Kristin  Subjective/Objective Assessment:   pt admit with gastroparesis     Action/Plan:   pt from home   Anticipated DC Date:  01/02/2014   Anticipated DC Plan:  [place]  CM consult  [facility]  Medication Assistance      Choice offered to / List presented to:             Status of service:  Completed, signed off Medicare Important Message given?  NO (If response is "NO", the following Medicare IM given date fields will be blank) Date Medicare IM given:   Medicare IM given by:   Date Additional Medicare IM given:   Additional Medicare IM given by:    Discharge Disposition:  HOME/SELF CARE  Per UR Regulation:    If discussed at Long Length of Stay Meetings, dates discussed:    Comments:  01/10/14 CM met with pt in room who states he has lost his job and no longer is insured and states this is why he has been non-compliant with his medications.  CM gave pt [place] with a list of participating pharmacies.  Pt verbalizes understanding of MATCH parameters.  CM gave pt [place] pamphlet and pt verbalized understanding he will go to the clinic any weekday morning of this week and ask for: AN APPOINTMENT TO SECURE A PRIMARY CARE PHYSICIAN; AN APPOINTMENT WITH A NAVIGATOR TO SECURE INSURANCE THROUGH THE AFFORDABLE CARE ACT; AN APPOINTMENT FOR FOLLOW UP MEDICAL CARE.   No other CM needs were communicated.  Mariane Masters, BSN, [place].  01/04/14 [place], BSN [phone] patient still with n/v, on clears, if conts to be symtomatic may have to put NG tube in.  NCM will continue to follow for dc needs.  01/01/14 Sharolyn Douglas RN pt admit with gastroparesis. Anticipated dc home no needs.

## 2014-01-02 LAB — GLUCOSE, CAPILLARY
Glucose-Capillary: 143 mg/dL — ABNORMAL HIGH (ref 70–99)
Glucose-Capillary: 153 mg/dL — ABNORMAL HIGH (ref 70–99)
Glucose-Capillary: 157 mg/dL — ABNORMAL HIGH (ref 70–99)
Glucose-Capillary: 159 mg/dL — ABNORMAL HIGH (ref 70–99)
Glucose-Capillary: 171 mg/dL — ABNORMAL HIGH (ref 70–99)

## 2014-01-02 LAB — BASIC METABOLIC PANEL
ANION GAP: 16 — AB (ref 5–15)
BUN: 14 mg/dL (ref 6–23)
CALCIUM: 9.2 mg/dL (ref 8.4–10.5)
CHLORIDE: 102 meq/L (ref 96–112)
CO2: 24 mEq/L (ref 19–32)
CREATININE: 1.17 mg/dL (ref 0.50–1.35)
GFR calc Af Amer: >90 mL/min (ref >90)
GFR calc non Af Amer: 78 mL/min — ABNORMAL LOW (ref >90)
GLUCOSE: 175 mg/dL — AB (ref 70–99)
Potassium: 4.1 mEq/L (ref 3.7–5.3)
Sodium: 142 mEq/L (ref 137–147)

## 2014-01-02 MED ORDER — SODIUM CHLORIDE 0.9 % IV SOLN
250.0000 mg | Freq: Four times a day (QID) | INTRAVENOUS | Status: DC
  Administered 2014-01-02 – 2014-01-05 (×11): 250 mg via INTRAVENOUS
  Filled 2014-01-02 (×18): qty 5

## 2014-01-02 NOTE — Plan of Care (Signed)
Problem: Phase III Progression Outcomes Goal: Pain controlled on oral analgesia Outcome: Not Progressing

## 2014-01-02 NOTE — Progress Notes (Signed)
     [facility] Progress Note  Subjective:  Still with epigastric pain and nausea. Vomiting in to emesis bag during visit.    Objective:  Vital signs in last 24 hours: Temp:  [97.7 F (36.5 C)-99.8 F (37.7 C)] 99.4 F (37.4 C) (11/21 0501) Pulse Rate:  [115-118] 115 (11/21 0501) Resp:  [12-18] 14 (11/21 0501) BP: (157-177)/(76-107) 161/94 mmHg (11/21 0501) SpO2:  [98 %-100 %] 98 % (11/21 0501) Weight:  [262 lb 9.1 oz (119.1 kg)] 262 lb 9.1 oz (119.1 kg) (11/21 0626) Last BM Date: 12/30/13 General:   Alert,  Well-developed,    in NAD Heart:  Regular rate and rhythm; no murmurs Pulm;lungs clear Abdomen:  Soft, mild diffuse tenderness, nondistended. Normal bowel sounds, without guarding, and without rebound.   Extremities:  Without edema. Neurologic:  Alert and  oriented x4;  grossly normal neurologically. Psych:  Alert and cooperative. Normal mood and affect.  Intake/Output from previous day: 11/20 0701 - 11/21 0700 In: 2788.3 [I.V.:2238.3; IV Piggyback:550] Out: 1775 [Urine:1175; Emesis/NG output:600] Intake/Output this shift: Total I/O In: 186.7 [I.V.:186.7] Out: -   Lab Results:  Recent Labs  12/30/13 2141 12/31/13 0952 01/01/14 0542  WBC 13.5* 13.3* 11.1*  HGB 14.0 12.4* 13.9  HCT 41.7 36.2* 41.6  PLT 332 PLATELET CLUMPS NOTED ON SMEAR, UNABLE TO ESTIMATE 328   BMET  Recent Labs  12/31/13 0952 01/01/14 0542 01/02/14 0713  NA 140 140 142  K 4.9 4.4 4.1  CL 103 101 102  CO2 22 22 24   GLUCOSE 157* 199* 175*  BUN 20 16 14   CREATININE 1.22 1.15 1.17  CALCIUM 9.2 9.4 9.2   LFT  Recent Labs  12/31/13 0550  PROT 8.5*  ALBUMIN 4.5  AST 13  ALT 14  ALKPHOS 108  BILITOT 0.3     ASSESSMENT/PLAN:   38 yo male with known gastroparesis admitted with worsening nausea and vomiting. Continues to vomit. Have discussed NPO with NGT, but pt declines NGT at this time--wants to wait til tomorrow if he is till vomiting a lot. Continue current  regimen and if not improved in a.m., consider NGT.    LOS: 3 days   Hvozdovic, Vita Barley PA-C 01/02/2014, Pager [phone]  Attending MD note:   I have taken a history, examined the patient, and reviewed the chart. I agree with the Advanced Practitioner's impression and recommendations. Appears about the same, but no hiccups today. Appears depressed.Continue Carafate, PPI,Reglan.He had a recent EGD elsewhere  [facility] Pager # [phone]

## 2014-01-02 NOTE — Progress Notes (Signed)
PROGRESS NOTE    Duane [name] [id number] DOB: Mar 27, 1975 DOA: 12/30/2013 PCP: [name], MD  HPI/Brief narrative 38 year old male with history of IDDM with neuropathy, diabetic gastroparesis with recurrent flare, essential hypertension, presented with subacute onset of nausea, vomiting and epigastric pain that started night prior to admission. He was treated by PCP for right great toe wound infection with Bactrim.   Assessment/Plan:  1. Diabetic gastroparesis: Patient was hospitalized in September 2015 for intractable nausea and vomiting attributed to diabetic gastroparesis. Gastroparesis was confirmed by gastric emptying scan on 08/18/13. GI had been consulted during that admission. Patient kept asking for IV Dilaudid despite advise to reduce by MDs. He was treated empirically with IV Reglan, PPI, erythromycin and antiemetics. Finally he was transferred to [facility] for further management. Patient states that he has been fine with no complaints until this admission. Treated supportively with bowel rest, IV Reglan (brief), IV Erthythromycin (brief) and when necessary Zofran. We'll minimize opioids. (Opioid seeking behavior as per previous admission notes.) . Has moderate stool burden by x-ray-laxatives. Although patient states that he feels some improvement, continues to have nausea and vomiting. Thereby [facility] consulted and appreciate input. We'll increase PPI to twice a day. Continues to be symptomatic. Continue nothing by mouth. GI recommends NG tube-patient wishes to hold off until a.m. 2. Dehydration: Secondary to problem #1. Continue IV fluids while nothing by mouth and ongoing GI losses. 3. Acute renal failure: Likely prerenal from dehydration.Continue IV fluids. Resolved.  4. Uncontrolled type I DM with peripheral neuropathy and gastroparesis: Hemoglobin A1c in August was 9.3 suggesting poor control. Continue Lantus and SSI. Reasonable inpatient control. 5. Essential  hypertension: Mildly uncontrolled. Continue scheduled IV metoprolol and IV hydralazine when necessary. Fluctuating BP secondary to ongoing intermittent pain. Increased IV scheduled metoprolol. 6. Right great toe wound infection: Clinically improved on OP Bactrim-continue same and DC clindamycin. Wound care consultation appreciated -recommend follow-up at [place] [facility] for follow-up assessment and sharp debridement once the nonviable tissue has softened .     Code Status: Full Family Communication: None at bedside.  Disposition Plan: Not medically stable for discharge.   Consultants:  Gastroenterology  Procedures:  None   Antibiotics:  Clindamycin-DC   Subjective: States that nausea and vomiting are even slightly better than yesterday but still seen having during M.D. visit.  Objective: Filed Vitals:   01/02/14 0211 01/02/14 0501 01/02/14 0626 01/02/14 1346  BP: 177/107 161/94  171/102  Pulse:  115  109  Temp:  99.4 F (37.4 C)  98.4 F (36.9 C)  TempSrc:  Oral  Oral  Resp:  14  16  Height:      Weight:   119.1 kg (262 lb 9.1 oz)   SpO2:  98%  95%    Intake/Output Summary (Last 24 hours) at 01/02/14 1403 Last data filed at 01/02/14 0715  Gross per 24 hour  Intake   2825 ml  Output   1175 ml  Net   1650 ml   Filed Weights   12/31/13 0300 01/01/14 0440 01/02/14 0626  Weight: 120.5 kg (265 lb 10.5 oz) 119.6 kg (263 lb 10.7 oz) 119.1 kg (262 lb 9.1 oz)     Exam:  General exam: Pleasant young male sitting up and vomiting in an emesis bag. Respiratory system: Clear. No increased work of breathing. Cardiovascular system: S1 & S2 heard, RRR. No JVD, murmurs, gallops, clicks or pedal edema. Tele: ST  Gastrointestinal system: Abdomen is nondistended, soft and nontender.  Normal bowel sounds heard. Central nervous system: Alert and oriented. No focal neurological deficits. Extremities: Symmetric 5 x 5 power.   Data Reviewed: Basic Metabolic  Panel:  Recent Labs Lab 12/30/13 2141 12/31/13 0550 12/31/13 0952 01/01/14 0542 01/02/14 0713  NA 136* 138 140 140 142  K 4.1 6.8* 4.9 4.4 4.1  CL 95* 98 103 101 102  CO2 21 18* 22 22 24   GLUCOSE 244* 290* 157* 199* 175*  BUN 20 22 20 16 14   CREATININE 1.59* 1.38* 1.22 1.15 1.17  CALCIUM 9.8 9.3 9.2 9.4 9.2   Liver Function Tests:  Recent Labs Lab 12/30/13 2141 12/31/13 0550  AST 14 13  ALT 13 14  ALKPHOS 114 108  BILITOT 0.4 0.3  PROT 8.5* 8.5*  ALBUMIN 4.6 4.5   No results for input(s): LIPASE, AMYLASE in the last 168 hours. No results for input(s): AMMONIA in the last 168 hours. CBC:  Recent Labs Lab 12/30/13 2141 12/31/13 0952 01/01/14 0542  WBC 13.5* 13.3* 11.1*  NEUTROABS 10.2* 10.9*  --   HGB 14.0 12.4* 13.9  HCT 41.7 36.2* 41.6  MCV 79.7 79.6 81.3  PLT 332 PLATELET CLUMPS NOTED ON SMEAR, UNABLE TO ESTIMATE 328   Cardiac Enzymes:  Recent Labs Lab 12/30/13 2327  TROPONINI <0.30   BNP (last 3 results) No results for input(s): PROBNP in the last 8760 hours. CBG:  Recent Labs Lab 01/01/14 2133 01/01/14 2351 01/02/14 0336 01/02/14 0751 01/02/14 1206  GLUCAP 137* 158* 143* 153* 157*    No results found for this or any previous visit (from the past 240 hour(s)).        Studies: No results found.      Scheduled Meds: . collagenase   Topical Daily  . erythromycin  250 mg Intravenous 4 times per day  . gabapentin  300 mg Oral QHS  . Glycerin (Adult)  1 suppository Rectal BID  . heparin  5,000 Units Subcutaneous 3 times per day  . insulin aspart  0-15 Units Subcutaneous Q4H  . insulin glargine  20 Units Subcutaneous QHS  . metoCLOPramide (REGLAN) injection  10 mg Intravenous 3 times per day  . metoprolol  5 mg Intravenous 4 times per day  . pantoprazole (PROTONIX) IV  40 mg Intravenous Q12H  . polyethylene glycol  17 g Oral BID  . senna  2 tablet Oral BID  . sodium chloride  3 mL Intravenous Q12H  . sucralfate  1 g Oral 4  times per day  . sulfamethoxazole-trimethoprim  1 tablet Oral BID   Continuous Infusions: . sodium chloride 100 mL/hr at 01/02/14 0518    Principal Problem:   Gastroparesis Active Problems:   IDDM (insulin dependent diabetes mellitus)   HTN (hypertension)   Constipation   Epigastric pain   Type I diabetes mellitus with complication, uncontrolled   Essential hypertension    Time spent: 30 minutes.    Vernell Leep, MD, FACP, FHM. [facility] Pager [phone]  If 7PM-7AM, please contact night-coverage [email or web address] Password [email or web address] 01/02/2014, 2:03 PM    LOS: 3 days

## 2014-01-03 LAB — GLUCOSE, CAPILLARY
GLUCOSE-CAPILLARY: 101 mg/dL — AB (ref 70–99)
GLUCOSE-CAPILLARY: 259 mg/dL — AB (ref 70–99)
GLUCOSE-CAPILLARY: 171 mg/dL — AB (ref 70–99)
Glucose-Capillary: 117 mg/dL — ABNORMAL HIGH (ref 70–99)
Glucose-Capillary: 133 mg/dL — ABNORMAL HIGH (ref 70–99)
Glucose-Capillary: 142 mg/dL — ABNORMAL HIGH (ref 70–99)
Glucose-Capillary: 162 mg/dL — ABNORMAL HIGH (ref 70–99)

## 2014-01-03 NOTE — Progress Notes (Signed)
PROGRESS NOTE    Duane [name] [id number] DOB: 12-04-1975 DOA: 12/30/2013 PCP: [name], MD  HPI/Brief narrative 38 year old male with history of IDDM with neuropathy, diabetic gastroparesis with recurrent flare, essential hypertension, presented with subacute onset of nausea, vomiting and epigastric pain that started night prior to admission. He was treated by PCP for right great toe wound infection with Bactrim.   Assessment/Plan:  1. Diabetic gastroparesis: Patient was hospitalized in September 2015 for intractable nausea and vomiting attributed to diabetic gastroparesis. Gastroparesis was confirmed by gastric emptying scan on 08/18/13. GI had been consulted during that admission. Patient kept asking for IV Dilaudid despite advise to reduce by MDs. He was treated empirically with IV Reglan, PPI, erythromycin and antiemetics. Finally he was transferred to [facility] for further management. Patient states that he has been fine with no complaints until this admission. Treated supportively with bowel rest, IV Reglan (brief), IV Erthythromycin (brief) and when necessary Zofran. We'll minimize opioids. (Opioid seeking behavior as per previous admission notes.) . Has moderate stool burden by x-ray-laxatives. Although patient states that he feels some improvement, continues to have nausea and vomiting. Thereby [facility] consulted and appreciate input. We'll increase PPI to twice a day. Much improved today. Start liquid diet and advance gradually as tolerated. 2. Dehydration: Secondary to problem #1. Continue IV fluids pending adequate oral intake 3. Acute renal failure: Likely prerenal from dehydration.Continue IV fluids. Resolved.  4. Uncontrolled type I DM with peripheral neuropathy and gastroparesis: Hemoglobin A1c in August was 9.3 suggesting poor control. Continue Lantus and SSI. Reasonable inpatient control. 5. Essential hypertension: Mildly uncontrolled. Continue scheduled IV  metoprolol and IV hydralazine when necessary. Fluctuating BP secondary to ongoing intermittent pain. Increased IV scheduled metoprolol. Change to PO when adequate PO intake. 6. Right great toe wound infection: Clinically improved on OP Bactrim-continue same and DC clindamycin. Wound care consultation appreciated -recommend follow-up at [place] [facility] for follow-up assessment and sharp debridement once the nonviable tissue has softened .     Code Status: Full Family Communication: None at bedside.  Disposition Plan: Not medically stable for discharge.   Consultants:  Gastroenterology  Procedures:  None   Antibiotics:  Clindamycin-DC   Subjective: Feels much better with significant improvement in nausea and vomiting.  Objective: Filed Vitals:   01/03/14 0026 01/03/14 0027 01/03/14 0622 01/03/14 1350  BP: 122/69 122/69 131/72 113/69  Pulse:  106 99 92  Temp:   98.6 F (37 C) 97.4 F (36.3 C)  TempSrc:   Oral Oral  Resp:   18 18  Height:      Weight:   119 kg (262 lb 5.6 oz)   SpO2:   97% 100%    Intake/Output Summary (Last 24 hours) at 01/03/14 1620 Last data filed at 01/03/14 1400  Gross per 24 hour  Intake    730 ml  Output      0 ml  Net    730 ml   Filed Weights   01/01/14 0440 01/02/14 0626 01/03/14 0622  Weight: 119.6 kg (263 lb 10.7 oz) 119.1 kg (262 lb 9.1 oz) 119 kg (262 lb 5.6 oz)     Exam:  General exam: Pleasant young male sitting up on chair appears comfortable. Respiratory system: Clear. No increased work of breathing. Cardiovascular system: S1 & S2 heard, RRR. No JVD, murmurs, gallops, clicks or pedal edema. Tele: SR in 90's- DC'ed  Gastrointestinal system: Abdomen is nondistended, soft and nontender. Normal bowel sounds heard. Central nervous  system: Alert and oriented. No focal neurological deficits. Extremities: Symmetric 5 x 5 power.   Data Reviewed: Basic Metabolic Panel:  Recent Labs Lab 12/30/13 2141 12/31/13 0550  12/31/13 0952 01/01/14 0542 01/02/14 0713  NA 136* 138 140 140 142  K 4.1 6.8* 4.9 4.4 4.1  CL 95* 98 103 101 102  CO2 21 18* 22 22 24   GLUCOSE 244* 290* 157* 199* 175*  BUN 20 22 20 16 14   CREATININE 1.59* 1.38* 1.22 1.15 1.17  CALCIUM 9.8 9.3 9.2 9.4 9.2   Liver Function Tests:  Recent Labs Lab 12/30/13 2141 12/31/13 0550  AST 14 13  ALT 13 14  ALKPHOS 114 108  BILITOT 0.4 0.3  PROT 8.5* 8.5*  ALBUMIN 4.6 4.5   No results for input(s): LIPASE, AMYLASE in the last 168 hours. No results for input(s): AMMONIA in the last 168 hours. CBC:  Recent Labs Lab 12/30/13 2141 12/31/13 0952 01/01/14 0542  WBC 13.5* 13.3* 11.1*  NEUTROABS 10.2* 10.9*  --   HGB 14.0 12.4* 13.9  HCT 41.7 36.2* 41.6  MCV 79.7 79.6 81.3  PLT 332 PLATELET CLUMPS NOTED ON SMEAR, UNABLE TO ESTIMATE 328   Cardiac Enzymes:  Recent Labs Lab 12/30/13 2327  TROPONINI <0.30   BNP (last 3 results) No results for input(s): PROBNP in the last 8760 hours. CBG:  Recent Labs Lab 01/03/14 0023 01/03/14 0414 01/03/14 0756 01/03/14 1146 01/03/14 1545  GLUCAP 142* 117* 101* 259* 162*    No results found for this or any previous visit (from the past 240 hour(s)).        Studies: No results found.      Scheduled Meds: . collagenase   Topical Daily  . erythromycin  250 mg Intravenous Q6H  . gabapentin  300 mg Oral QHS  . Glycerin (Adult)  1 suppository Rectal BID  . heparin  5,000 Units Subcutaneous 3 times per day  . insulin aspart  0-15 Units Subcutaneous Q4H  . insulin glargine  20 Units Subcutaneous QHS  . metoCLOPramide (REGLAN) injection  10 mg Intravenous 3 times per day  . metoprolol  5 mg Intravenous 4 times per day  . pantoprazole (PROTONIX) IV  40 mg Intravenous Q12H  . polyethylene glycol  17 g Oral BID  . senna  2 tablet Oral BID  . sodium chloride  3 mL Intravenous Q12H  . sucralfate  1 g Oral 4 times per day  . sulfamethoxazole-trimethoprim  1 tablet Oral BID    Continuous Infusions:    Principal Problem:   Gastroparesis Active Problems:   IDDM (insulin dependent diabetes mellitus)   HTN (hypertension)   Constipation   Epigastric pain   Type I diabetes mellitus with complication, uncontrolled   Essential hypertension    Time spent: 30 minutes.    Vernell Leep, MD, FACP, FHM. [facility] Pager [phone]  If 7PM-7AM, please contact night-coverage [email or web address] Password [email or web address] 01/03/2014, 4:20 PM    LOS: 4 days

## 2014-01-03 NOTE — Plan of Care (Signed)
Problem: Phase III Progression Outcomes Goal: Foley discontinued Outcome: Not Applicable Date Met:  71/85/50

## 2014-01-03 NOTE — Progress Notes (Signed)
   Subjective  Feeling much better,  Finally turning the corner   Objective  Severe gastroparesis, has been NPO till now, feeling much better this morning, wants to eat, sitting up in bed with a smile Vital signs in last 24 hours: Temp:  [98.4 F (36.9 C)-99.7 F (37.6 C)] 98.6 F (37 C) (11/22 0622) Pulse Rate:  [99-116] 99 (11/22 0622) Resp:  [16-18] 18 (11/22 0622) BP: (122-171)/(69-102) 131/72 mmHg (11/22 0622) SpO2:  [95 %-98 %] 97 % (11/22 0622) Weight:  [262 lb 5.6 oz (119 kg)] 262 lb 5.6 oz (119 kg) (11/22 0622) Last BM Date: 01/02/14 General:   AAin NAD Heart:  Regular rate and rhythm; no murmurs Lungs: Respirations even and unlabored, lungs CTA bilaterally Abdomen:  Soft, tender in epigastrium  and nondistended. Normal bowel sounds. Extremities:  Without edema. Neurologic:  Alert and oriented,  grossly normal neurologically. Psych:  Cooperative. Normal mood and affect.  Intake/Output from previous day: 11/21 0701 - 11/22 0700 In: 486.7 [I.V.:186.7; IV Piggyback:300] Out: -  Intake/Output this shift: Total I/O In: 200 [IV Piggyback:200] Out: -   Lab Results:  Recent Labs  12/31/13 0952 01/01/14 0542  WBC 13.3* 11.1*  HGB 12.4* 13.9  HCT 36.2* 41.6  PLT PLATELET CLUMPS NOTED ON SMEAR, UNABLE TO ESTIMATE 328   BMET  Recent Labs  12/31/13 0952 01/01/14 0542 01/02/14 0713  NA 140 140 142  K 4.9 4.4 4.1  CL 103 101 102  CO2 22 22 24   GLUCOSE 157* 199* 175*  BUN 20 16 14   CREATININE 1.22 1.15 1.17  CALCIUM 9.2 9.4 9.2   LFT No results for input(s): PROT, ALBUMIN, AST, ALT, ALKPHOS, BILITOT, BILIDIR, IBILI in the last 72 hours. PT/INR No results for input(s): LABPROT, INR in the last 72 hours.  Studies/Results: No results found.     Assessment / Plan:   Diabetic visceral neuropathy/ gastroparesis, clinically improved Plan to advance diet Continue Carafate slurry, PPi, Reglan Please call prn  Principal Problem:   Gastroparesis Active  Problems:   IDDM (insulin dependent diabetes mellitus)   HTN (hypertension)   Constipation   Epigastric pain   Type I diabetes mellitus with complication, uncontrolled   Essential hypertension     LOS: 4 days   Delfin Edis  01/03/2014, 6:44 AM

## 2014-01-04 LAB — GLUCOSE, CAPILLARY
GLUCOSE-CAPILLARY: 192 mg/dL — AB (ref 70–99)
GLUCOSE-CAPILLARY: 187 mg/dL — AB (ref 70–99)
GLUCOSE-CAPILLARY: 149 mg/dL — AB (ref 70–99)
Glucose-Capillary: 167 mg/dL — ABNORMAL HIGH (ref 70–99)
Glucose-Capillary: 181 mg/dL — ABNORMAL HIGH (ref 70–99)

## 2014-01-04 NOTE — Progress Notes (Signed)
PROGRESS NOTE    Duane [name] [id number] DOB: 1975-09-15 DOA: 12/30/2013 PCP: [name], MD  HPI/Brief narrative 38 year old male with history of IDDM with neuropathy, diabetic gastroparesis with recurrent flare, essential hypertension, presented with subacute onset of nausea, vomiting and epigastric pain that started night prior to admission. He was treated by PCP for right great toe wound infection with Bactrim.   Assessment/Plan:  1. Diabetic gastroparesis: Patient was hospitalized in September 2015 for intractable nausea and vomiting attributed to diabetic gastroparesis. Gastroparesis was confirmed by gastric emptying scan on 08/18/13. GI had been consulted during that admission. Patient kept asking for IV Dilaudid despite advise to reduce by MDs. He was treated empirically with IV Reglan, PPI, erythromycin and antiemetics. Finally he was transferred to [facility] for further management. Patient states that he has been fine with no complaints until this admission. Treated supportively with bowel rest, IV Reglan (brief), IV Erthythromycin (brief) and when necessary Zofran. We'll minimize opioids. (Opioid seeking behavior as per previous admission notes.) . Has moderate stool burden by x-ray-laxatives. Although patient states that he feels some improvement, continues to have nausea and vomiting. Thereby [facility] consulted and appreciate input. We'll increase PPI to twice a day. Patient had improved on 11/22, had tolerated diet until evening and then started having episodes of nausea and vomiting. Advised patient that if he continues to be symptomatic, we will have to make him NPO and place NGT for 24-48 hours >he wishes to wait and monitor for a couple of hours to see how he does. 2. Dehydration: Secondary to problem #1. Continue IV fluids pending adequate oral intake 3. Acute renal failure: Likely prerenal from dehydration.Continue IV fluids. Resolved.  4. Uncontrolled type I DM  with peripheral neuropathy and gastroparesis: Hemoglobin A1c in August was 9.3 suggesting poor control. Continue Lantus and SSI. Reasonable inpatient control. 5. Essential hypertension: Mildly uncontrolled. Continue scheduled IV metoprolol and IV hydralazine when necessary. Fluctuating BP secondary to ongoing intermittent pain. Increased IV scheduled metoprolol. Change to PO when adequate PO intake. 6. Right great toe wound infection: Clinically improved on OP Bactrim-continue same and DC clindamycin. Wound care consultation appreciated -recommend follow-up at [place] [facility] for follow-up assessment and sharp debridement once the nonviable tissue has softened .     Code Status: Full Family Communication: None at bedside.  Disposition Plan: Not medically stable for discharge.   Consultants:  Gastroenterology-signed off  Procedures:  None   Antibiotics:  Clindamycin-DC   Subjective: States that he was feeling better and tolerated diet until last evening when he started having recurrent nausea and vomiting through this morning. Had a BM yesterday.  Objective: Filed Vitals:   01/03/14 2324 01/04/14 0500 01/04/14 0507 01/04/14 0546  BP: 141/89  180/105 175/98  Pulse: 99  103 97  Temp: 97.6 F (36.4 C)  98.9 F (37.2 C) 98.9 F (37.2 C)  TempSrc: Oral  Oral Oral  Resp: 18  16 18   Height:      Weight:  120.3 kg (265 lb 3.4 oz)    SpO2: 100%  96% 100%    Intake/Output Summary (Last 24 hours) at 01/04/14 1134 Last data filed at 01/04/14 1008  Gross per 24 hour  Intake    920 ml  Output    400 ml  Net    520 ml   Filed Weights   01/02/14 0626 01/03/14 0622 01/04/14 0500  Weight: 119.1 kg (262 lb 9.1 oz) 119 kg (262 lb 5.6 oz) 120.3  kg (265 lb 3.4 oz)     Exam:  General exam: Pleasant young male sitting up in bed and appears slightly uncomfortable man intermittently spitting in emesis bag.Marland Kitchen Respiratory system: Clear. No increased work of  breathing. Cardiovascular system: S1 & S2 heard, RRR. No JVD, murmurs, gallops, clicks or pedal edema.  Gastrointestinal system: Abdomen is nondistended, soft and nontender. Normal bowel sounds heard. Central nervous system: Alert and oriented. No focal neurological deficits. Extremities: Symmetric 5 x 5 power.   Data Reviewed: Basic Metabolic Panel:  Recent Labs Lab 12/30/13 2141 12/31/13 0550 12/31/13 0952 01/01/14 0542 01/02/14 0713  NA 136* 138 140 140 142  K 4.1 6.8* 4.9 4.4 4.1  CL 95* 98 103 101 102  CO2 21 18* 22 22 24   GLUCOSE 244* 290* 157* 199* 175*  BUN 20 22 20 16 14   CREATININE 1.59* 1.38* 1.22 1.15 1.17  CALCIUM 9.8 9.3 9.2 9.4 9.2   Liver Function Tests:  Recent Labs Lab 12/30/13 2141 12/31/13 0550  AST 14 13  ALT 13 14  ALKPHOS 114 108  BILITOT 0.4 0.3  PROT 8.5* 8.5*  ALBUMIN 4.6 4.5   No results for input(s): LIPASE, AMYLASE in the last 168 hours. No results for input(s): AMMONIA in the last 168 hours. CBC:  Recent Labs Lab 12/30/13 2141 12/31/13 0952 01/01/14 0542  WBC 13.5* 13.3* 11.1*  NEUTROABS 10.2* 10.9*  --   HGB 14.0 12.4* 13.9  HCT 41.7 36.2* 41.6  MCV 79.7 79.6 81.3  PLT 332 PLATELET CLUMPS NOTED ON SMEAR, UNABLE TO ESTIMATE 328   Cardiac Enzymes:  Recent Labs Lab 12/30/13 2327  TROPONINI <0.30   BNP (last 3 results) No results for input(s): PROBNP in the last 8760 hours. CBG:  Recent Labs Lab 01/03/14 1545 01/03/14 2000 01/03/14 2319 01/04/14 0430 01/04/14 0809  GLUCAP 162* 171* 133* 167* 192*    No results found for this or any previous visit (from the past 240 hour(s)).        Studies: No results found.      Scheduled Meds: . collagenase   Topical Daily  . erythromycin  250 mg Intravenous Q6H  . gabapentin  300 mg Oral QHS  . Glycerin (Adult)  1 suppository Rectal BID  . heparin  5,000 Units Subcutaneous 3 times per day  . insulin aspart  0-15 Units Subcutaneous Q4H  . insulin glargine  20  Units Subcutaneous QHS  . metoCLOPramide (REGLAN) injection  10 mg Intravenous 3 times per day  . metoprolol  5 mg Intravenous 4 times per day  . pantoprazole (PROTONIX) IV  40 mg Intravenous Q12H  . polyethylene glycol  17 g Oral BID  . senna  2 tablet Oral BID  . sodium chloride  3 mL Intravenous Q12H  . sucralfate  1 g Oral 4 times per day  . sulfamethoxazole-trimethoprim  1 tablet Oral BID   Continuous Infusions:    Principal Problem:   Gastroparesis Active Problems:   IDDM (insulin dependent diabetes mellitus)   HTN (hypertension)   Constipation   Epigastric pain   Type I diabetes mellitus with complication, uncontrolled   Essential hypertension    Time spent: 30 minutes.    Vernell Leep, MD, FACP, FHM. [facility] Pager [phone]  If 7PM-7AM, please contact night-coverage [email or web address] Password [email or web address] 01/04/2014, 11:34 AM    LOS: 5 days

## 2014-01-04 NOTE — Progress Notes (Signed)
MD notified that patient has had more nausea/vomiting progressively throughout night and required more nausea/pain medicine versus day shift.  Diet has been changed to clear liquids.  Will continue to monitor patient.

## 2014-01-05 DIAGNOSIS — E876 Hypokalemia: Secondary | ICD-10-CM

## 2014-01-05 LAB — GLUCOSE, CAPILLARY
GLUCOSE-CAPILLARY: 140 mg/dL — AB (ref 70–99)
GLUCOSE-CAPILLARY: 151 mg/dL — AB (ref 70–99)
GLUCOSE-CAPILLARY: 179 mg/dL — AB (ref 70–99)
GLUCOSE-CAPILLARY: 192 mg/dL — AB (ref 70–99)
GLUCOSE-CAPILLARY: 214 mg/dL — AB (ref 70–99)
Glucose-Capillary: 186 mg/dL — ABNORMAL HIGH (ref 70–99)
Glucose-Capillary: 150 mg/dL — ABNORMAL HIGH (ref 70–99)

## 2014-01-05 LAB — BASIC METABOLIC PANEL
ANION GAP: 17 — AB (ref 5–15)
BUN: 12 mg/dL (ref 6–23)
CALCIUM: 8.9 mg/dL (ref 8.4–10.5)
CO2: 22 meq/L (ref 19–32)
Chloride: 102 mEq/L (ref 96–112)
Creatinine, Ser: 1.19 mg/dL (ref 0.50–1.35)
GFR calc non Af Amer: 77 mL/min — ABNORMAL LOW (ref >90)
GFR, EST AFRICAN AMERICAN: 89 mL/min — AB (ref >90)
Glucose, Bld: 160 mg/dL — ABNORMAL HIGH (ref 70–99)
Potassium: 3.6 mEq/L — ABNORMAL LOW (ref 3.7–5.3)
SODIUM: 141 meq/L (ref 137–147)

## 2014-01-05 MED ORDER — PANTOPRAZOLE SODIUM 40 MG IV SOLR
40.0000 mg | Freq: Two times a day (BID) | INTRAVENOUS | Status: DC
  Administered 2014-01-05 – 2014-01-09 (×8): 40 mg via INTRAVENOUS
  Filled 2014-01-05 (×9): qty 40

## 2014-01-05 MED ORDER — METOCLOPRAMIDE HCL 10 MG PO TABS
10.0000 mg | ORAL_TABLET | Freq: Three times a day (TID) | ORAL | Status: DC
  Filled 2014-01-05 (×3): qty 1

## 2014-01-05 MED ORDER — LORAZEPAM 2 MG/ML IJ SOLN: 0.5000 mg | Freq: Four times a day (QID) | INTRAMUSCULAR | Status: DC | PRN

## 2014-01-05 MED ORDER — PANTOPRAZOLE SODIUM 40 MG PO TBEC: 40.0000 mg | DELAYED_RELEASE_TABLET | Freq: Two times a day (BID) | ORAL | Status: DC

## 2014-01-05 MED ORDER — METOPROLOL TARTRATE 12.5 MG HALF TABLET
12.5000 mg | ORAL_TABLET | Freq: Two times a day (BID) | ORAL | Status: DC
  Filled 2014-01-05: qty 1

## 2014-01-05 MED ORDER — ONDANSETRON HCL 4 MG/2ML IJ SOLN
4.0000 mg | Freq: Four times a day (QID) | INTRAMUSCULAR | Status: DC | PRN
  Administered 2014-01-05 – 2014-01-07 (×5): 4 mg via INTRAVENOUS
  Filled 2014-01-05 (×6): qty 2

## 2014-01-05 MED ORDER — PANTOPRAZOLE SODIUM 40 MG PO TBEC
40.0000 mg | DELAYED_RELEASE_TABLET | Freq: Two times a day (BID) | ORAL | Status: DC
Start: 2014-01-05 — End: 2014-01-06

## 2014-01-05 MED ORDER — ERYTHROMYCIN BASE 250 MG PO TBEC
250.0000 mg | DELAYED_RELEASE_TABLET | Freq: Three times a day (TID) | ORAL | Status: DC
  Filled 2014-01-05 (×4): qty 1

## 2014-01-05 MED ORDER — METOPROLOL TARTRATE 1 MG/ML IV SOLN
5.0000 mg | Freq: Four times a day (QID) | INTRAVENOUS | Status: DC
  Administered 2014-01-06 – 2014-01-09 (×15): 5 mg via INTRAVENOUS
  Filled 2014-01-05 (×22): qty 5

## 2014-01-05 MED ORDER — PANTOPRAZOLE SODIUM 40 MG IV SOLR: 40.0000 mg | Freq: Two times a day (BID) | INTRAVENOUS | Status: DC

## 2014-01-05 MED ORDER — PROMETHAZINE HCL 25 MG/ML IJ SOLN
12.5000 mg | Freq: Four times a day (QID) | INTRAMUSCULAR | Status: DC | PRN
  Administered 2014-01-06 – 2014-01-07 (×4): 12.5 mg via INTRAVENOUS
  Filled 2014-01-05 (×4): qty 1

## 2014-01-05 MED ORDER — POTASSIUM CHLORIDE 10 MEQ/100ML IV SOLN
10.0000 meq | INTRAVENOUS | Status: AC
Start: 2014-01-05 — End: 2014-01-05
  Administered 2014-01-05 (×3): 10 meq via INTRAVENOUS
  Filled 2014-01-05 (×3): qty 100

## 2014-01-05 MED ORDER — MORPHINE SULFATE 2 MG/ML IJ SOLN
0.5000 mg | INTRAMUSCULAR | Status: DC | PRN
  Administered 2014-01-06 (×5): 0.5 mg via INTRAVENOUS
  Filled 2014-01-05 (×6): qty 1

## 2014-01-05 MED ORDER — MORPHINE SULFATE 2 MG/ML IJ SOLN
0.5000 mg | INTRAMUSCULAR | Status: DC | PRN
  Administered 2014-01-05: 0.5 mg via INTRAMUSCULAR
  Filled 2014-01-05: qty 1

## 2014-01-05 MED ORDER — INSULIN GLARGINE 100 UNIT/ML ~~LOC~~ SOLN
10.0000 [IU] | Freq: Once | SUBCUTANEOUS | Status: AC
  Administered 2014-01-05: 10 [IU] via SUBCUTANEOUS
  Filled 2014-01-05 (×2): qty 0.1

## 2014-01-05 MED ORDER — PROMETHAZINE HCL 25 MG/ML IJ SOLN
12.5000 mg | Freq: Four times a day (QID) | INTRAMUSCULAR | Status: DC | PRN
Start: 2014-01-05 — End: 2014-01-05
  Administered 2014-01-05: 12.5 mg via INTRAMUSCULAR
  Filled 2014-01-05: qty 1

## 2014-01-05 MED ORDER — MORPHINE SULFATE 2 MG/ML IJ SOLN
0.5000 mg | INTRAMUSCULAR | Status: DC | PRN
  Administered 2014-01-05: 0.5 mg via INTRAVENOUS
  Filled 2014-01-05: qty 1

## 2014-01-05 MED ORDER — PROMETHAZINE HCL 12.5 MG RE SUPP: 12.5000 mg | Freq: Four times a day (QID) | RECTAL | Status: DC | PRN

## 2014-01-05 NOTE — Progress Notes (Signed)
Shift events: 1. RN paged because IV team couldn't find IV site after previous IV site was lost. So, meds were changed to po or IM or rectal dosing depending on the med.  At that time, RN informed me (and she had pt last night too) that pt takes his po meds during the day and always refuses at night secondary to "can't swallow" or n/v.  RN says she has never witnessed an episode of alleged vomiting.  2. Later, RN paged back that pt now had a small caliber IV. Some meds changed back to IV with orders that the other routes could be used should IV be lost again. 3. RN paged stating pt is now refusing his po meds and his BP is up. Metoprolol 5mg  IV q6h added back. Also, CBG 70. Lantus dose reduced to 10U just for tonight.  These issues will be reported to oncoming attending in am as they need to be further addressed given the change in his behavior during night shift. Also, there is possibility of discharge of pt and he will need to be off IV meds before then. Clance Boll, NP [facility]

## 2014-01-05 NOTE — Progress Notes (Signed)
PROGRESS NOTE    Duane [name] [id number] DOB: 12/26/1975 DOA: 12/30/2013 PCP: [name], MD  HPI/Brief narrative 38 year old male with history of IDDM with neuropathy, diabetic gastroparesis with recurrent flare, essential hypertension, presented with subacute onset of nausea, vomiting and epigastric pain that started night prior to admission. He was treated by PCP for right great toe wound infection with Bactrim. Despite bowel rest and maximal treatment for diabetic gastroparesis, patient continues to be symptomatic of nausea, vomiting and progressing slowly. GI had signed off-requested to follow-up again on 11/24. DC home when medically stable.   Assessment/Plan:  1. Diabetic gastroparesis: Patient was hospitalized in September 2015 for intractable nausea and vomiting attributed to diabetic gastroparesis. Gastroparesis was confirmed by gastric emptying scan on 08/18/13. GI had been consulted during that admission. Patient kept asking for IV Dilaudid despite advise to reduce by MDs. He was treated empirically with IV Reglan, PPI, erythromycin and antiemetics. Finally he was transferred to [facility] for further management. Patient states that he has been fine with no complaints until this admission. Treated supportively with bowel rest, IV Reglan (brief), IV Erthythromycin (brief) and when necessary Zofran. We'll minimize opioids. (Opioid seeking behavior as per previous admission notes.) . Has moderate stool burden by x-ray-laxatives. Although patient states that he feels some improvement, continues to have nausea and vomiting. Thereby [facility] consulted and appreciate input. We'll increase PPI to twice a day. Patient had improved on 11/22, had tolerated diet until evening and then started having episodes of nausea and vomiting. Continues to make slow progress and remains symptomatic with nausea, vomiting. Some of his spitting seems to be from excessive salivation. Requested GI to  follow-up again.  2. Dehydration: Secondary to problem #1. Continue IV fluids pending adequate oral intake 3. Acute renal failure: Likely prerenal from dehydration.Continue IV fluids. Resolved.  4. Uncontrolled type I DM with peripheral neuropathy and gastroparesis: Hemoglobin A1c in August was 9.3 suggesting poor control. Continue Lantus and SSI. Reasonable inpatient control. 5. Essential hypertension: Mildly uncontrolled. Continue scheduled IV metoprolol and IV hydralazine when necessary. Fluctuating BP secondary to ongoing intermittent pain. Increased IV scheduled metoprolol. Change to PO when adequate PO intake. 6. Right great toe wound infection: Clinically improved on OP Bactrim-continue same and DC clindamycin. Wound care consultation appreciated -recommend follow-up at [facility] for follow-up assessment and sharp debridement once the nonviable tissue has softened .  7. Hpokalemia: Replace and follow     Code Status: Full Family Communication: None at bedside.  Disposition Plan: Not medically stable for discharge.   Consultants:  Gastroenterology  Procedures:  None   Antibiotics:  Clindamycin-DC   Subjective: States that he is improving slowly but continues to have intermittent nausea and vomiting on polypharmacy for same. Has not reported significant abdominal pain this morning. Last BM 11/22.  Objective: Filed Vitals:   01/05/14 0056 01/05/14 0510 01/05/14 0601 01/05/14 0604  BP: 160/96 166/94  130/79  Pulse: 100 100  97  Temp:    99.9 F (37.7 C)  TempSrc:    Oral  Resp:    18  Height:      Weight:   119.6 kg (263 lb 10.7 oz)   SpO2:    98%    Intake/Output Summary (Last 24 hours) at 01/05/14 1142 Last data filed at 01/05/14 1026  Gross per 24 hour  Intake    862 ml  Output      0 ml  Net    862 ml  Filed Weights   01/03/14 0622 01/04/14 0500 01/05/14 0601  Weight: 119 kg (262 lb 5.6 oz) 120.3 kg (265 lb 3.4 oz) 119.6 kg (263 lb 10.7  oz)     Exam:  General exam: Pleasant young male sitting up in bed and appears comfortable.  Respiratory system: Clear. No increased work of breathing. Cardiovascular system: S1 & S2 heard, RRR. No JVD, murmurs, gallops, clicks or pedal edema.  Gastrointestinal system: Abdomen is nondistended, soft and nontender. Normal bowel sounds heard. Central nervous system: Alert and oriented. No focal neurological deficits. Extremities: Symmetric 5 x 5 power.   Data Reviewed: Basic Metabolic Panel:  Recent Labs Lab 12/31/13 0550 12/31/13 0952 01/01/14 0542 01/02/14 0713 01/05/14 0544  NA 138 140 140 142 141  K 6.8* 4.9 4.4 4.1 3.6*  CL 98 103 101 102 102  CO2 18* 22 22 24 22   GLUCOSE 290* 157* 199* 175* 160*  BUN 22 20 16 14 12   CREATININE 1.38* 1.22 1.15 1.17 1.19  CALCIUM 9.3 9.2 9.4 9.2 8.9   Liver Function Tests:  Recent Labs Lab 12/30/13 2141 12/31/13 0550  AST 14 13  ALT 13 14  ALKPHOS 114 108  BILITOT 0.4 0.3  PROT 8.5* 8.5*  ALBUMIN 4.6 4.5   No results for input(s): LIPASE, AMYLASE in the last 168 hours. No results for input(s): AMMONIA in the last 168 hours. CBC:  Recent Labs Lab 12/30/13 2141 12/31/13 0952 01/01/14 0542  WBC 13.5* 13.3* 11.1*  NEUTROABS 10.2* 10.9*  --   HGB 14.0 12.4* 13.9  HCT 41.7 36.2* 41.6  MCV 79.7 79.6 81.3  PLT 332 PLATELET CLUMPS NOTED ON SMEAR, UNABLE TO ESTIMATE 328   Cardiac Enzymes:  Recent Labs Lab 12/30/13 2327  TROPONINI <0.30   BNP (last 3 results) No results for input(s): PROBNP in the last 8760 hours. CBG:  Recent Labs Lab 01/04/14 1657 01/04/14 1935 01/05/14 0035 01/05/14 0330 01/05/14 0809  GLUCAP 149* 181* 151* 140* 192*    No results found for this or any previous visit (from the past 240 hour(s)).        Studies: No results found.      Scheduled Meds: . collagenase   Topical Daily  . erythromycin  250 mg Intravenous Q6H  . gabapentin  300 mg Oral QHS  . Glycerin (Adult)  1  suppository Rectal BID  . heparin  5,000 Units Subcutaneous 3 times per day  . insulin aspart  0-15 Units Subcutaneous Q4H  . insulin glargine  20 Units Subcutaneous QHS  . metoCLOPramide (REGLAN) injection  10 mg Intravenous 3 times per day  . metoprolol  5 mg Intravenous 4 times per day  . pantoprazole (PROTONIX) IV  40 mg Intravenous Q12H  . polyethylene glycol  17 g Oral BID  . senna  2 tablet Oral BID  . sodium chloride  3 mL Intravenous Q12H  . sucralfate  1 g Oral 4 times per day  . sulfamethoxazole-trimethoprim  1 tablet Oral BID   Continuous Infusions:    Principal Problem:   Gastroparesis Active Problems:   IDDM (insulin dependent diabetes mellitus)   HTN (hypertension)   Constipation   Epigastric pain   Type I diabetes mellitus with complication, uncontrolled   Essential hypertension    Time spent: 30 minutes.    Vernell Leep, MD, FACP, FHM. [facility] Pager [phone]  If 7PM-7AM, please contact night-coverage [email or web address] Password [email or web address] 01/05/2014, 11:42 AM    LOS: 6 days

## 2014-01-05 NOTE — Progress Notes (Signed)
Inpatient Diabetes Program Recommendations  AACE/ADA: New Consensus Statement on Inpatient Glycemic Control (2013)  Target Ranges:  Prepandial:   less than 140 mg/dL      Peak postprandial:   less than 180 mg/dL (1-2 hours)      Critically ill patients:  140 - 180 mg/dL   Results for IZEK, TALSMA (MRN [id number]) as of 01/05/2014 11:00  Ref. Range 01/04/2014 04:30 01/04/2014 08:09 01/04/2014 12:00 01/04/2014 16:57 01/04/2014 19:35 01/05/2014 00:35 01/05/2014 03:30 01/05/2014 08:09  Glucose-Capillary Latest Range: 70-99 mg/dL 167 (H) 192 (H) 187 (H) 149 (H) 181 (H) 151 (H) 140 (H) 192 (H)   Current orders for Inpatient glycemic control: Lantus 20 units QHS, Novolog 0-15 units Q4H  Inpatient Diabetes Program Recommendations Insulin - Basal: CBGs ranged from 140-192 mg/dl on 11/23 and patient received a total of Novolog 11 units for correction on 11/23. CBGs today have ranged from 140-192 mg/dl and patient has already received a total of Novolog 8 units for correction today. Please consider increasing Lantus to 24 units QHS.  Thanks, Barnie Alderman, RN, MSN, CCRN, CDE Diabetes Coordinator Inpatient Diabetes Program [phone] (Team Pager) [phone] ([facility] office) [phone] [facility] office)'

## 2014-01-05 NOTE — Progress Notes (Signed)
Pt with a new IV site. Pt with complaint of pain and nausea/vomiting. Pt given IM pain and nausea medicine. Paged provider on call about symptoms and medication routes. Awaiting further orders. Will continue to monitor pt.

## 2014-01-05 NOTE — Progress Notes (Signed)
     [facility] Progress Note  Subjective:   Pt is a 38 yo male with diabetic visceral neuropathy/severe gastroparesis who was admitted 11/19 with worsening N/V. He was seen by GI and improved and was signed off on 11/22. Since then pt has again started having worsening nausea and vomiting. He is currently on regaln, phenergan, zofran, erythromycin, lorazepam and carafte for his N/V, and miralx and glycerin suppositories for his constipation.Pt has vomitied this morning. We discuused NGT placement, and he is hesitant because he  Says " in a few more days I should be better". Pt  Says he had appt to f/u with  Gastroenterologist at [facility] after his Sept admission, but missed the appt "because it conflicted with my schedule". He is not sure if he ever called to reschedule.    Objective:  Vital signs in last 24 hours: Temp:  [99.1 F (37.3 C)-99.9 F (37.7 C)] 99.9 F (37.7 C) (11/24 0604) Pulse Rate:  [97-102] 97 (11/24 0604) Resp:  [18] 18 (11/24 0604) BP: (130-175)/(79-97) 130/79 mmHg (11/24 0604) SpO2:  [97 %-99 %] 98 % (11/24 0604) Weight:  [263 lb 10.7 oz (119.6 kg)] 263 lb 10.7 oz (119.6 kg) (11/24 0601) Last BM Date: 01/02/14 General:   Alert,  Well-developed, male   in NAD Heart:  Regular rate and rhythm; no murmurs Pulm;lungs clear Abdomen:  Soft, nontender and nondistended. Normal bowel sounds, without guarding, and without rebound.   Extremities:  Without edema. Neurologic:  Alert and  oriented x4;  grossly normal neurologically. Psych:  Alert and cooperative. Normal mood and affect.  Intake/Output from previous day: 11/23 0701 - 11/24 0700 In: 740 [P.O.:240; IV Piggyback:500] Out: -  Intake/Output this shift: Total I/O In: 222 [P.O.:222] Out: -   Lab Results: No results for input(s): WBC, HGB, HCT, PLT in the last 72 hours. BMET  Recent Labs  01/05/14 0544  NA 141  K 3.6*  CL 102  CO2 22  GLUCOSE 160*  BUN 12  CREATININE 1.19  CALCIUM 8.9      ASSESSMENT/PLAN:   38 yo male with diabetic gastroparesis confirmed by [facility] 08/18/13. Pt had been transferred to [facility] last admission and was to f/u there regarding gastroparesis, but missed his appt. Pt continues on bid PPI, carafate, erythromycin, reglan , phenergan, lorazepam, and is still nauseous and vomiting. Pt has declined NGT Sat and Mon. Again declines today despite vomiting--but says he will think about it.  His A1C in Aug was 9.3, but glucose on admission under better control. Will review with Dr Deatra Ina as to additional recommendations.     LOS: 6 days   Hvozdovic, Deloris Ping 01/05/2014, Pager [phone]  GI Attending Note   Chart was reviewed and patient was examined. X-rays and lab were reviewed.   Have no further suggestions at this point.  He may be a candidate for a gastric pacemaker.  This would have to be done at [facility].  Would consider transfer.  Sandy Salaam. Deatra Ina, M.D., [facility] Cell [phone]

## 2014-01-05 NOTE — Plan of Care (Signed)
Problem: Phase III Progression Outcomes Goal: Voiding independently Outcome: Not Applicable Date Met:  19/50/93

## 2014-01-05 NOTE — Progress Notes (Addendum)
Pt with a CBG of 80. Paged provider on call kirby. Awaiting further orders. Will continue to monitor pt. Pt's dose of lantus reduced to 10 units.

## 2014-01-05 NOTE — Progress Notes (Signed)
Spoke with provider on call, Baltazar Najjar about pt's lack of IV access and the inability to get another site. Pt's medications switched to alternative routes. Will update patient on information.

## 2014-01-05 NOTE — Progress Notes (Signed)
Pt states won't be able to keep po medications down. Paged provider to get medications via alternative route.

## 2014-01-05 NOTE — Progress Notes (Addendum)
Pt with bp of 172/93. Paged provider on call, Baltazar Najjar. Awaiting further orders. Will continue to monitor pt. Pt switched back to lopressor iv q6

## 2014-01-05 NOTE — Progress Notes (Signed)
RN paged this NP secondary to IV team attempting to restart IV and failed to locate site. Pt is a documented difficult IV stick. Notes on chart reviewed, and it appears pt has been taking po meds and is on a clear liquid diet. Given this, will change his IV meds to po or IM. If pt needs more IV meds, may have to consider PICC line tomorrow.  Clance Boll, NP [facility]

## 2014-01-06 LAB — BASIC METABOLIC PANEL
Anion gap: 18 — ABNORMAL HIGH (ref 5–15)
BUN: 14 mg/dL (ref 6–23)
CALCIUM: 9.1 mg/dL (ref 8.4–10.5)
CO2: 21 meq/L (ref 19–32)
CREATININE: 1.17 mg/dL (ref 0.50–1.35)
Chloride: 105 mEq/L (ref 96–112)
GFR calc Af Amer: >90 mL/min (ref >90)
GFR, EST NON AFRICAN AMERICAN: 78 mL/min — AB (ref >90)
GLUCOSE: 140 mg/dL — AB (ref 70–99)
Potassium: 3.9 mEq/L (ref 3.7–5.3)
Sodium: 144 mEq/L (ref 137–147)

## 2014-01-06 LAB — GLUCOSE, CAPILLARY
GLUCOSE-CAPILLARY: 171 mg/dL — AB (ref 70–99)
GLUCOSE-CAPILLARY: 130 mg/dL — AB (ref 70–99)
GLUCOSE-CAPILLARY: 134 mg/dL — AB (ref 70–99)
Glucose-Capillary: 124 mg/dL — ABNORMAL HIGH (ref 70–99)
Glucose-Capillary: 145 mg/dL — ABNORMAL HIGH (ref 70–99)
Glucose-Capillary: 157 mg/dL — ABNORMAL HIGH (ref 70–99)

## 2014-01-06 MED ORDER — BOOST / RESOURCE BREEZE PO LIQD
1.0000 | Freq: Two times a day (BID) | ORAL | Status: DC
  Administered 2014-01-06 – 2014-01-10 (×8): 1 via ORAL

## 2014-01-06 MED ORDER — SODIUM CHLORIDE 0.9 % IV SOLN
250.0000 mg | Freq: Four times a day (QID) | INTRAVENOUS | Status: DC
  Administered 2014-01-06 – 2014-01-09 (×11): 250 mg via INTRAVENOUS
  Filled 2014-01-06 (×14): qty 5

## 2014-01-06 MED ORDER — METOCLOPRAMIDE HCL 5 MG/ML IJ SOLN
10.0000 mg | Freq: Four times a day (QID) | INTRAMUSCULAR | Status: DC
  Administered 2014-01-06 – 2014-01-09 (×11): 10 mg via INTRAVENOUS
  Filled 2014-01-06 (×16): qty 2

## 2014-01-06 MED ORDER — SODIUM CHLORIDE 0.9 % IV SOLN
INTRAVENOUS | Status: AC
  Administered 2014-01-06: 01:00:00 via INTRAVENOUS

## 2014-01-06 NOTE — Progress Notes (Signed)
INITIAL NUTRITION ASSESSMENT  DOCUMENTATION CODES Per approved criteria  -Obesity Unspecified   INTERVENTION: Provide Resource Breeze BID in between meals Diet advancement per MD; recommend full liquid diet or Low Fat diet RD to continue to monitor diet advancement and PO adequacy  NUTRITION DIAGNOSIS: Inadequate oral intake related to nausea and vomiting in the setting of gastroparesis as evidenced by liquid diet x 7 days.   Goal: Pt to meet >/= 90% of their estimated nutrition needs   Monitor:  PO intake/tolerance, diet advancement, weight trend, labs  Reason for Assessment: NPO/liquid diet x 7 days  38 y.o. male  Admitting Dx: Gastroparesis  ASSESSMENT: 38 year old male with history of IDDM with neuropathy, diabetic gastroparesis, essential hypertension, presented with subacute onset of nausea, vomiting and epigastric pain 11/18.  He reports feeling better and tolerating liquids the past couple days but, nauseous today and unable to keep clear liquids down. He reports that he was maintaining his weight around 277 lbs prior to symptoms worsening. Per weight history patient has lost 4 % if his body weight in the past 3 months.  Patient has been visited by dietitians during previous admissions and given nutrition education regarding gastroparesis and a low fiber diet. Patient reports success with following diet changes but, he is unable to stay on track. RD encouraged patient to avoid foods that have given him problems in the past when his diet is advanced. Discussed ways to maximize nutrition and protein intake while on a liquid diet. Patient is agreeable to receiving [employer]. He has no further questions or concerns at this time.   Labs: glucose ranging 130 to 214 mg/dL  Height: Ht Readings from Last 1 Encounters:  01/05/14 5\' 10"  (1.778 m)    Weight: Wt Readings from Last 1 Encounters:  01/05/14 263 lb 10.7 oz (119.6 kg)    Ideal Body Weight: 166 lbs  % Ideal  Body Weight: 158%  Wt Readings from Last 10 Encounters:  01/05/14 263 lb 10.7 oz (119.6 kg)  10/15/13 275 lb (124.739 kg)  10/08/13 272 lb 12.8 oz (123.741 kg)  09/22/13 274 lb 11.1 oz (124.6 kg)  09/20/13 279 lb (126.554 kg)  09/18/13 279 lb 6.4 oz (126.735 kg)  08/28/13 285 lb (129.275 kg)  08/16/13 285 lb 7.9 oz (129.5 kg)  06/11/13 283 lb 4.7 oz (128.5 kg)  05/30/13 290 lb (131.543 kg)    Usual Body Weight: 277 lbs  % Usual Body Weight: 95%  BMI:  Body mass index is 37.83 kg/(m^2). (obese)  Estimated Nutritional Needs: Kcal: 2100-2400 Protein: 95-110 grams Fluid: 2.1-2.4 L/day  Skin: popped blister on right toe  Diet Order: Diet clear liquid  EDUCATION NEEDS: -No education needs identified at this time   Intake/Output Summary (Last 24 hours) at 01/06/14 1456 Last data filed at 01/06/14 1452  Gross per 24 hour  Intake    480 ml  Output   1500 ml  Net  -1020 ml    Last BM: 11/21  Labs:   Recent Labs Lab 01/02/14 0713 01/05/14 0544 01/06/14 0550  NA 142 141 144  K 4.1 3.6* 3.9  CL 102 102 105  CO2 24 22 21   BUN 14 12 14   CREATININE 1.17 1.19 1.17  CALCIUM 9.2 8.9 9.1  GLUCOSE 175* 160* 140*    CBG (last 3)   Recent Labs  01/06/14 0308 01/06/14 0748 01/06/14 1138  GLUCAP 171* 130* 145*    Scheduled Meds: . collagenase   Topical Daily  .  erythromycin  250 mg Oral TID WC  . gabapentin  300 mg Oral QHS  . Glycerin (Adult)  1 suppository Rectal BID  . heparin  5,000 Units Subcutaneous 3 times per day  . insulin aspart  0-15 Units Subcutaneous Q4H  . insulin glargine  20 Units Subcutaneous QHS  . metoCLOPramide  10 mg Oral TID AC  . metoprolol  5 mg Intravenous 4 times per day  . pantoprazole (PROTONIX) IV  40 mg Intravenous Q12H   Or  . pantoprazole  40 mg Oral Q12H  . polyethylene glycol  17 g Oral BID  . senna  2 tablet Oral BID  . sodium chloride  3 mL Intravenous Q12H  . sucralfate  1 g Oral 4 times per day  .  sulfamethoxazole-trimethoprim  1 tablet Oral BID    Continuous Infusions:   Past Medical History  Diagnosis Date  . Hypertension   . Diabetes mellitus without complication age 33    insulin requiring from the start  . Gastritis   . Gastroparesis 2012    100% gastric retention on 08/2013 [facility]  . Obesity   . Diabetic neuropathy     Past Surgical History  Procedure Laterality Date  . Cholecystectomy  2013  . Knee surgery Left   . Flexible sigmoidoscopy N/A 06/11/2013    Procedure: FLEXIBLE SIGMOIDOSCOPY;  Surgeon: [name], MD;  Location: [facility] ENDOSCOPY;  Service: Endoscopy;  Laterality: N/A;  . Esophagogastroduodenoscopy N/A 06/11/2013    Procedure: ESOPHAGOGASTRODUODENOSCOPY (EGD);  Surgeon: [name], MD;  Location: [facility];  Service: Endoscopy;  Laterality: N/A;    Pryor Ochoa RD, LDN Inpatient Clinical Dietitian Pager: [phone] After Hours Pager: [phone]

## 2014-01-06 NOTE — Progress Notes (Signed)
          Daily Rounding Note  01/06/2014, 9:15 AM  LOS: 7 days   SUBJECTIVE:       Vomited bilious, nonbloody material throughout the night and into this morning. Says he is now willing to have the NG tube placed. He is obviously frustrated by the situation.  Received a total of 1 mg of morphine divided in 0.5 mg doses. This is improved from previous days uses of 3 mg.  OBJECTIVE:         Vital signs in last 24 hours:    Temp:  [98.9 F (37.2 C)-99.2 F (37.3 C)] 99.2 F (37.3 C) (11/25 0519) Pulse Rate:  [100-128] 116 (11/25 0519) Resp:  [18-20] 18 (11/25 0519) BP: (114-173)/(59-103) 158/98 mmHg (11/25 0519) SpO2:  [99 %-100 %] 99 % (11/25 0519) Weight:  [263 lb 10.7 oz (119.6 kg)] 263 lb 10.7 oz (119.6 kg) (11/24 1349) Last BM Date: 01/02/14 Filed Weights   01/04/14 0500 01/05/14 0601 01/05/14 1349  Weight: 265 lb 3.4 oz (120.3 kg) 263 lb 10.7 oz (119.6 kg) 263 lb 10.7 oz (119.6 kg)   General: Overweight, nontoxic, tired appearing AAM    Heart: RRR Chest: Clear bilaterally no shortness of breath or cough Abdomen: Soft, not tender or distended. Bowel sounds active. Extremities: No CCE Neuro/Psych:  Affect somewhat flat. Oriented 3. No gross neurologic deficits.  Intake/Output from previous day: 11/24 0701 - 11/25 0700 In: 644 [P.O.:444; IV Piggyback:200] Out: 1200 [Emesis/NG output:1200]  Intake/Output this shift:    Lab Results: No results for input(s): WBC, HGB, HCT, PLT in the last 72 hours. BMET  Recent Labs  01/05/14 0544 01/06/14 0550  NA 141 144  K 3.6* 3.9  CL 102 105  CO2 22 21  GLUCOSE 160* 140*  BUN 12 14  CREATININE 1.19 1.17  CALCIUM 8.9 9.1   LFT No results for input(s): PROT, ALBUMIN, AST, ALT, ALKPHOS, BILITOT, BILIDIR, IBILI in the last 72 hours. PT/INR No results for input(s): LABPROT, INR in the last 72 hours. Hepatitis Panel No results for input(s): HEPBSAG, HCVAB, HEPAIGM, HEPBIGM  in the last 72 hours.  Studies/Results: No results found.  ASSESMENT:   *  Long history of severe diabetic gastroparesis. Has not responded to multiple medications. Previously refused placement of NG tube but is now willing to undergo placement.    PLAN   *  Would contact [facility] regarding transfer of patient. There is nothing here in [place] to offer this patient other than placement of NG tube at this point. Has failed medical therapy.      Azucena Freed  01/06/2014, 9:15 AM Pager: [phone]  GI Attending Note  I have personally taken an interval history, reviewed the chart, and examined the patient.  I agree with the extender's note, impression and recommendations.  Sandy Salaam. Deatra Ina, MD, [facility] [phone]

## 2014-01-06 NOTE — Progress Notes (Signed)
PROGRESS NOTE    Duane [name] [id number] DOB: 27-Apr-1975 DOA: 12/30/2013 PCP: [name], MD  HPI/Brief narrative 38 year old male with history of IDDM with neuropathy, diabetic gastroparesis, essential hypertension, presented with subacute onset of nausea, vomiting and epigastric pain 11/18.  Flex sig normal 4/30, UGI mid sized hiatal hernia  He was treated by PCP for right great toe wound infection with Bactrim.  He states because of $$, he was running out of his medications and not taking them regularly Despite bowel rest and maximal treatment for diabetic gastroparesis, patient continues to be symptomatic of nausea, vomiting and progressing slowly. GI had signed off-requested to follow-up again on 11/24.    Assessment/Plan:  1. Diabetic gastroparesis: Patient was hospitalized in September 2015 for intractable nausea and vomiting attributed to diabetic gastroparesis. Gastroparesis was confirmed by gastric emptying scan on 08/18/13. GI had been consulted during that admission. Patient kept asking for IV Dilaudid despite advise to reduce by MDs. He was treated empirically with IV Reglan, PPI, erythromycin and antiemetics. Finally he was transferred to [facility] for further management. Patient states that he has been fine with no complaints until this admission. Treated supportively with bowel rest, IV Erthythromycin (brief) was given-he was re-stated on IV reglan 10 mg tid 11/25.  Has moderate stool burden by x-ray-laxatives. Although patient states that he feels some improvement, continues to have nausea and vomiting. Thereby [facility] consulted and rec NG tube as also transfer to [facility]-he didn't wish to go to [facility] . We'll increase PPI to twice a day. Patient had improved on 11/22, then relapsed somewhat.   2. Dehydration: Secondary to problem #1. Continue IV fluids pending adequate oral intake 3. Acute renal failure: Likely prerenal from dehydration.Continue IV fluids. Resolved.   4. Uncontrolled type I DM with peripheral neuropathy and gastroparesis: Hemoglobin A1c in August was 9.3 suggesting poor control. Continue Lantus and SSI. Reasonable inpatient control. 5. Essential hypertension: Mildly uncontrolled. Continue scheduled IV metoprolol and IV hydralazine when necessary. Fluctuating BP secondary to ongoing intermittent pain. Increased IV scheduled metoprolol. Change to PO when adequate PO intake. 6. Right great toe wound infection: Clinically improved on OP Bactrim-continue same and d/c on 11/2.   DC clindamycin. Wound care consultation appreciated -recommend follow-up at [place] [facility] for follow-up assessment and sharp debridement once the nonviable tissue has softened .  7. Hpokalemia: Replace and follow     Code Status: Full Family Communication: None at bedside.  Disposition Plan: Not medically stable for discharge.   Consultants:  Gastroenterology  Procedures:  None   Antibiotics:  Clindamycin-DC   Subjective:  Doing fair. Some vomit and nausea. No other issues. Couldn't get NG tube placed.  Objective: Filed Vitals:   01/05/14 2219 01/06/14 0100 01/06/14 0519 01/06/14 1324  BP: 173/92 166/90 158/98 180/109  Pulse: 128 105 116   Temp: 98.9 F (37.2 C)  99.2 F (37.3 C) 98.8 F (37.1 C)  TempSrc: Oral  Oral Oral  Resp: 20  18 15   Height:      Weight:      SpO2: 99%  99% 94%    Intake/Output Summary (Last 24 hours) at 01/06/14 1325 Last data filed at 01/06/14 1105  Gross per 24 hour  Intake    340 ml  Output   1500 ml  Net  -1160 ml   Filed Weights   01/04/14 0500 01/05/14 0601 01/05/14 1349  Weight: 120.3 kg (265 lb 3.4 oz) 119.6 kg (263 lb 10.7 oz) 119.6  kg (263 lb 10.7 oz)     Exam:  General exam: Pleasant young male sitting up in bed and appears comfortable.  Respiratory system: Clear. No increased work of breathing. Cardiovascular system: S1 & S2 heard, RRR. No JVD, murmurs, gallops, clicks or pedal  edema.  Gastrointestinal system: Abdomen is nondistended, soft and nontender. Normal bowel sounds heard.  Data Reviewed: Basic Metabolic Panel:  Recent Labs Lab 12/31/13 0952 01/01/14 0542 01/02/14 0713 01/05/14 0544 01/06/14 0550  NA 140 140 142 141 144  K 4.9 4.4 4.1 3.6* 3.9  CL 103 101 102 102 105  CO2 22 22 24 22 21   GLUCOSE 157* 199* 175* 160* 140*  BUN 20 16 14 12 14   CREATININE 1.22 1.15 1.17 1.19 1.17  CALCIUM 9.2 9.4 9.2 8.9 9.1   Liver Function Tests:  Recent Labs Lab 12/30/13 2141 12/31/13 0550  AST 14 13  ALT 13 14  ALKPHOS 114 108  BILITOT 0.4 0.3  PROT 8.5* 8.5*  ALBUMIN 4.6 4.5   No results for input(s): LIPASE, AMYLASE in the last 168 hours. No results for input(s): AMMONIA in the last 168 hours. CBC:  Recent Labs Lab 12/30/13 2141 12/31/13 0952 01/01/14 0542  WBC 13.5* 13.3* 11.1*  NEUTROABS 10.2* 10.9*  --   HGB 14.0 12.4* 13.9  HCT 41.7 36.2* 41.6  MCV 79.7 79.6 81.3  PLT 332 PLATELET CLUMPS NOTED ON SMEAR, UNABLE TO ESTIMATE 328   Cardiac Enzymes:  Recent Labs Lab 12/30/13 2327  TROPONINI <0.30   BNP (last 3 results) No results for input(s): PROBNP in the last 8760 hours. CBG:  Recent Labs Lab 01/05/14 2003 01/05/14 2342 01/06/14 0308 01/06/14 0748 01/06/14 1138  GLUCAP 214* 179* 171* 130* 145*    No results found for this or any previous visit (from the past 240 hour(s)).        Studies: No results found.      Scheduled Meds: . collagenase   Topical Daily  . erythromycin  250 mg Oral TID WC  . gabapentin  300 mg Oral QHS  . Glycerin (Adult)  1 suppository Rectal BID  . heparin  5,000 Units Subcutaneous 3 times per day  . insulin aspart  0-15 Units Subcutaneous Q4H  . insulin glargine  20 Units Subcutaneous QHS  . metoCLOPramide  10 mg Oral TID AC  . metoprolol  5 mg Intravenous 4 times per day  . pantoprazole (PROTONIX) IV  40 mg Intravenous Q12H   Or  . pantoprazole  40 mg Oral Q12H  .  polyethylene glycol  17 g Oral BID  . senna  2 tablet Oral BID  . sodium chloride  3 mL Intravenous Q12H  . sucralfate  1 g Oral 4 times per day  . sulfamethoxazole-trimethoprim  1 tablet Oral BID   Continuous Infusions:    Principal Problem:   Gastroparesis Active Problems:   IDDM (insulin dependent diabetes mellitus)   HTN (hypertension)   Constipation   Epigastric pain   Type I diabetes mellitus with complication, uncontrolled   Essential hypertension    Time spent: 50  [name], MD [facility] [phone]

## 2014-01-06 NOTE — Progress Notes (Signed)
Attempted x3 to insert  NGT for pt due to nausea and vomiting this morning at 1130. Talk to patient again this afternoon about  inserting NGT but he refused at this time stating he wanted to wait till later. Notified Azucena Freed, PA for gastroenterology about the attempts to place NGT, PA stated that order can be disregarded if patient refuses.

## 2014-01-07 ENCOUNTER — Inpatient Hospital Stay (HOSPITAL_COMMUNITY): Payer: BC Managed Care – PPO

## 2014-01-07 LAB — CBC WITH DIFFERENTIAL/PLATELET
BASOS PCT: 0 % (ref 0–1)
Basophils Absolute: 0 10*3/uL (ref 0.0–0.1)
EOS PCT: 1 % (ref 0–5)
Eosinophils Absolute: 0.1 10*3/uL (ref 0.0–0.7)
HEMATOCRIT: 41 % (ref 39.0–52.0)
Hemoglobin: 13.6 g/dL (ref 13.0–17.0)
Lymphocytes Relative: 20 % (ref 12–46)
Lymphs Abs: 1.9 10*3/uL (ref 0.7–4.0)
MCH: 26.6 pg (ref 26.0–34.0)
MCHC: 33.2 g/dL (ref 30.0–36.0)
MCV: 80.1 fL (ref 78.0–100.0)
MONO ABS: 0.8 10*3/uL (ref 0.1–1.0)
Monocytes Relative: 8 % (ref 3–12)
Neutro Abs: 6.7 10*3/uL (ref 1.7–7.7)
Neutrophils Relative %: 71 % (ref 43–77)
Platelets: 287 10*3/uL (ref 150–400)
RBC: 5.12 MIL/uL (ref 4.22–5.81)
RDW: 12.9 % (ref 11.5–15.5)
WBC: 9.5 10*3/uL (ref 4.0–10.5)

## 2014-01-07 LAB — BASIC METABOLIC PANEL
Anion gap: 16 — ABNORMAL HIGH (ref 5–15)
BUN: 16 mg/dL (ref 6–23)
CALCIUM: 9.2 mg/dL (ref 8.4–10.5)
CO2: 24 meq/L (ref 19–32)
CREATININE: 1.22 mg/dL (ref 0.50–1.35)
Chloride: 106 mEq/L (ref 96–112)
GFR calc Af Amer: 86 mL/min — ABNORMAL LOW (ref >90)
GFR calc non Af Amer: 74 mL/min — ABNORMAL LOW (ref >90)
GLUCOSE: 126 mg/dL — AB (ref 70–99)
Potassium: 3.7 mEq/L (ref 3.7–5.3)
Sodium: 146 mEq/L (ref 137–147)

## 2014-01-07 LAB — GLUCOSE, CAPILLARY
GLUCOSE-CAPILLARY: 175 mg/dL — AB (ref 70–99)
GLUCOSE-CAPILLARY: 165 mg/dL — AB (ref 70–99)
GLUCOSE-CAPILLARY: 183 mg/dL — AB (ref 70–99)
Glucose-Capillary: 125 mg/dL — ABNORMAL HIGH (ref 70–99)
Glucose-Capillary: 122 mg/dL — ABNORMAL HIGH (ref 70–99)

## 2014-01-07 MED ORDER — ACETAMINOPHEN 325 MG PO TABS
650.0000 mg | ORAL_TABLET | ORAL | Status: DC | PRN
  Administered 2014-01-07 – 2014-01-09 (×3): 650 mg via ORAL
  Filled 2014-01-07 (×3): qty 2

## 2014-01-07 NOTE — Progress Notes (Signed)
PROGRESS NOTE    Duane [name] [id number] DOB: October 02, 1975 DOA: 12/30/2013 PCP: [name], MD  HPI/Brief narrative 38 year old male with history of IDDM with neuropathy, diabetic gastroparesis, essential hypertension, presented with subacute onset of nausea, vomiting and epigastric pain 11/18.  Flex sig normal 4/30, UGI mid sized hiatal hernia  He was treated by PCP for right great toe wound infection with Bactrim.  He states because of $$, he was running out of his medications and not taking them regularly Despite bowel rest and maximal treatment for diabetic gastroparesis, patient continues to be symptomatic of nausea, vomiting and progressing slowly. GI had signed off-requested to follow-up again  And they recommneded NG tube whch canno be placed. Patient vomiting slighlty improved only   Assessment/Plan:  1. Diabetic gastroparesis: Patient was hospitalized in September 2015 for intractable nausea and vomiting attributed to diabetic gastroparesis. Gastroparesis was confirmed by gastric emptying scan on 08/18/13. GI had been consulted during that admission. Patient kept asking for IV Dilaudid despite advise to reduce by MDs. He was treated empirically with IV Reglan, PPI, erythromycin and antiemetics. Finally he was transferred to [facility] for further management. Patient states that he has been fine with no complaints until this admission. Treated supportively with bowel rest, IV Erthythromycin (brief) was given-he was re-stated on IV reglan 10 mg tid 11/25.  Has moderate stool burden by x-ray-laxatives. Although patient states that he feels some improvement, continues to have nausea and vomiting. Difficult situation-if not improvement 11/27, consider transfer again to [facility]-I have discussed with him that we will taper his opiates  2. Dehydration: Secondary to problem #1. Continue IV fluids pending adequate oral intake-Bmr tis stable 3. Acute renal failure: Likely prerenal from  dehydration.Continue IV fluids. Resolved.  4. Uncontrolled type I DM with peripheral neuropathy and gastroparesis: Hemoglobin A1c in August was 9.3 suggesting poor control. Continue Lantus and SSI. CBG's 120-175 5. Essential hypertension: Mildly uncontrolled. Continue scheduled IV metoprolol and IV hydralazine when necessary. Fluctuating BP secondary to ongoing intermittent pain. Increased IV scheduled metoprolol. Change to PO when adequate PO intake. 6. Right great toe wound infection: Clinically improved on OP Bactrim-continue same and d/c on 11/2.   DC clindamycin. Wound care consultation appreciated -recommend follow-up at [place] [facility] for follow-up assessment and sharp debridement once the nonviable tissue has softened .  7. Hpokalemia: Replace and follow     Code Status: Full Family Communication: None at bedside.  Disposition Plan: Not medically stable for discharge.   Consultants:  Gastroenterology  Procedures:  None   Antibiotics:  Clindamycin-DC   Subjective:  Mild nasea-again tolerating clears to some extent Some vomit today Couldn't get NG tube placed, even by IR  Objective: Filed Vitals:   01/07/14 0005 01/07/14 0606 01/07/14 0608 01/07/14 1422  BP: 156/93  158/97 171/111  Pulse: 114     Temp:   98.7 F (37.1 C) 98.9 F (37.2 C)  TempSrc:   Oral Oral  Resp:   18 15  Height:      Weight:  117 kg (257 lb 15 oz)    SpO2: 96%  98% 99%    Intake/Output Summary (Last 24 hours) at 01/07/14 1653 Last data filed at 01/07/14 1420  Gross per 24 hour  Intake    480 ml  Output    150 ml  Net    330 ml   Filed Weights   01/05/14 0601 01/05/14 1349 01/07/14 0606  Weight: 119.6 kg (263 lb 10.7 oz) 119.6  kg (263 lb 10.7 oz) 117 kg (257 lb 15 oz)     Exam:  General exam: Pleasant young male sitting up in bed and appears comfortable.  Respiratory system: Clear. No increased work of breathing. Cardiovascular system: S1 & S2 heard, RRR. No JVD,  murmurs, gallops, clicks or pedal edema.  Gastrointestinal system: Abdomen is nondistended, soft and nontender. Normal bowel sounds heard.  Data Reviewed: Basic Metabolic Panel:  Recent Labs Lab 01/01/14 0542 01/02/14 0713 01/05/14 0544 01/06/14 0550 01/07/14 0506  NA 140 142 141 144 146  K 4.4 4.1 3.6* 3.9 3.7  CL 101 102 102 105 106  CO2 22 24 22 21 24   GLUCOSE 199* 175* 160* 140* 126*  BUN 16 14 12 14 16   CREATININE 1.15 1.17 1.19 1.17 1.22  CALCIUM 9.4 9.2 8.9 9.1 9.2   Liver Function Tests: No results for input(s): AST, ALT, ALKPHOS, BILITOT, PROT, ALBUMIN in the last 168 hours. No results for input(s): LIPASE, AMYLASE in the last 168 hours. No results for input(s): AMMONIA in the last 168 hours. CBC:  Recent Labs Lab 01/01/14 0542 01/07/14 0506  WBC 11.1* 9.5  NEUTROABS  --  6.7  HGB 13.9 13.6  HCT 41.6 41.0  MCV 81.3 80.1  PLT 328 287   Cardiac Enzymes: No results for input(s): CKTOTAL, CKMB, CKMBINDEX, TROPONINI in the last 168 hours. BNP (last 3 results) No results for input(s): PROBNP in the last 8760 hours. CBG:  Recent Labs Lab 01/06/14 1649 01/06/14 2104 01/06/14 2341 01/07/14 0344 01/07/14 1206  GLUCAP 134* 157* 124* 125* 175*    No results found for this or any previous visit (from the past 240 hour(s)).        Studies: No results found.      Scheduled Meds: . collagenase   Topical Daily  . erythromycin  250 mg Intravenous 4 times per day  . feeding supplement (RESOURCE BREEZE)  1 Container Oral BID PC  . gabapentin  300 mg Oral QHS  . Glycerin (Adult)  1 suppository Rectal BID  . heparin  5,000 Units Subcutaneous 3 times per day  . insulin aspart  0-15 Units Subcutaneous Q4H  . insulin glargine  20 Units Subcutaneous QHS  . metoCLOPramide (REGLAN) injection  10 mg Intravenous 4 times per day  . metoprolol  5 mg Intravenous 4 times per day  . pantoprazole (PROTONIX) IV  40 mg Intravenous Q12H  . polyethylene glycol  17 g  Oral BID  . senna  2 tablet Oral BID  . sodium chloride  3 mL Intravenous Q12H  . sucralfate  1 g Oral 4 times per day  . sulfamethoxazole-trimethoprim  1 tablet Oral BID   Continuous Infusions:    Principal Problem:   Gastroparesis Active Problems:   IDDM (insulin dependent diabetes mellitus)   HTN (hypertension)   Constipation   Epigastric pain   Type I diabetes mellitus with complication, uncontrolled   Essential hypertension    Time spent: 90  [name], MD [facility] [phone]

## 2014-01-08 LAB — GLUCOSE, CAPILLARY
GLUCOSE-CAPILLARY: 99 mg/dL (ref 70–99)
GLUCOSE-CAPILLARY: 99 mg/dL (ref 70–99)
GLUCOSE-CAPILLARY: 147 mg/dL — AB (ref 70–99)
Glucose-Capillary: 126 mg/dL — ABNORMAL HIGH (ref 70–99)
Glucose-Capillary: 157 mg/dL — ABNORMAL HIGH (ref 70–99)
Glucose-Capillary: 221 mg/dL — ABNORMAL HIGH (ref 70–99)
Glucose-Capillary: 222 mg/dL — ABNORMAL HIGH (ref 70–99)

## 2014-01-08 NOTE — Plan of Care (Signed)
Problem: Discharge Progression Outcomes Goal: Tolerating diet Outcome: Progressing Pt is requesting to advance diet.

## 2014-01-08 NOTE — Progress Notes (Signed)
PROGRESS NOTE    Duane [name] [id number] DOB: 03-06-1975 DOA: 12/30/2013 PCP: [name], MD  HPI/Brief narrative 38 year old male with history of IDDM with neuropathy, diabetic gastroparesis, essential hypertension, presented with subacute onset of nausea, vomiting and epigastric pain 11/18.  Flex sig normal 4/30, UGI mid sized hiatal hernia  He was treated by PCP for right great toe wound infection with Bactrim.  He states because of $$, he was running out of his medications and not taking them regularly Despite bowel rest and maximal treatment for diabetic gastroparesis, patient continues to be symptomatic of nausea, vomiting and progressing slowly. GI had signed off-requested to follow-up again  And they recommneded NG tube whch canno be placed. Patient vomiting slighlty improved only   Assessment/Plan:  1. Diabetic gastroparesis: Patient was hospitalized in September 2015 for intractable nausea and vomiting attributed to diabetic gastroparesis. Gastroparesis was confirmed by gastric emptying scan on 08/18/13. GI had been consulted during that admission. Patient kept asking for IV Dilaudid despite advise to reduce by MDs. He was treated empirically with IV Reglan, PPI, erythromycin and antiemetics. Finally he was transferred to [facility] for further management. Patient states that he has been fine with no complaints until this admission. Treated supportively with bowel rest, IV Erthythromycin (brief) was given-he was re-stated on IV reglan 10 mg tid 11/25.  Has moderate stool burden by x-ray-laxatives. Patient feels can tolerate diet.  Start soft diet again 11/27  2. Dehydration: Secondary to problem #1. Continue IV fluids pending adequate oral intake-Bmet is stable 3. Acute renal failure: Likely prerenal from dehydration.Continue IV fluids. Resolved.  4. Uncontrolled type I DM with peripheral neuropathy and gastroparesis: Hemoglobin A1c in August was 9.3 suggesting poor control.  Continue Lantus and SSI. CBG's 120-175 5. Essential hypertension: Mildly uncontrolled. Continue scheduled IV metoprolol and IV hydralazine when necessary. Fluctuating BP secondary to ongoing intermittent pain. Increased IV scheduled metoprolol. Change to PO when adequate PO intake. 6. Right great toe wound infection: Clinically improved on OP Bactrim-continue same and d/c on 11/2.   DC clindamycin. Wound care consultation appreciated -recommend follow-up at [place] clinic for follow-up assessment and sharp debridement once the nonviable tissue has softened .  7. Hpokalemia: Replace and follow     Code Status: Full Family Communication: None at bedside.  Disposition Plan: Not medically stable for discharge.   Consultants:  Gastroenterology  Procedures:  None   Antibiotics:  Clindamycin-DC   Subjective:  Better overall Feels ready to try solids again No cp n V Other issue currently  Objective: Filed Vitals:   01/08/14 0159 01/08/14 0500 01/08/14 0529 01/08/14 1343  BP: 147/85  136/81 151/97  Pulse:   92   Temp: 99.5 F (37.5 C)  98.1 F (36.7 C) 97.9 F (36.6 C)  TempSrc: Oral  Oral Oral  Resp: 15  17 16   Height:      Weight:  117.2 kg (258 lb 6.1 oz)    SpO2: 100%  97% 96%    Intake/Output Summary (Last 24 hours) at 01/08/14 1401 Last data filed at 01/07/14 1420  Gross per 24 hour  Intake    240 ml  Output      0 ml  Net    240 ml   Filed Weights   01/05/14 1349 01/07/14 0606 01/08/14 0500  Weight: 119.6 kg (263 lb 10.7 oz) 117 kg (257 lb 15 oz) 117.2 kg (258 lb 6.1 oz)     Exam:  General exam: Pleasant young  male sitting up in bed and appears comfortable.  Respiratory system: Clear. No increased work of breathing. Cardiovascular system: S1 & S2 heard, RRR. No JVD, murmurs, gallops, clicks or pedal edema.  Gastrointestinal system: Abdomen is nondistended, soft and nontender. Normal bowel sounds heard.  Data Reviewed: Basic Metabolic  Panel:  Recent Labs Lab 01/02/14 0713 01/05/14 0544 01/06/14 0550 01/07/14 0506  NA 142 141 144 146  K 4.1 3.6* 3.9 3.7  CL 102 102 105 106  CO2 24 22 21 24   GLUCOSE 175* 160* 140* 126*  BUN 14 12 14 16   CREATININE 1.17 1.19 1.17 1.22  CALCIUM 9.2 8.9 9.1 9.2   Liver Function Tests: No results for input(s): AST, ALT, ALKPHOS, BILITOT, PROT, ALBUMIN in the last 168 hours. No results for input(s): LIPASE, AMYLASE in the last 168 hours. No results for input(s): AMMONIA in the last 168 hours. CBC:  Recent Labs Lab 01/07/14 0506  WBC 9.5  NEUTROABS 6.7  HGB 13.6  HCT 41.0  MCV 80.1  PLT 287   Cardiac Enzymes: No results for input(s): CKTOTAL, CKMB, CKMBINDEX, TROPONINI in the last 168 hours. BNP (last 3 results) No results for input(s): PROBNP in the last 8760 hours. CBG:  Recent Labs Lab 01/07/14 2234 01/08/14 0030 01/08/14 0403 01/08/14 0802 01/08/14 1208  GLUCAP 183* 157* 99 99 147*    No results found for this or any previous visit (from the past 240 hour(s)).        Studies: Dg Fluoro Rm 1-60 Min  01/08/2014   CLINICAL DATA:  Nausea  EXAM: FLOURO RM 1-60 MIN  : COMPARISON:  None  FINDINGS: Single frontal image of the upper chest and neck is submitted. The image shows a feeding tube with tip at the level of the mid neck, at the level hypopharynx and larynx. Reportedly, no tube was left in place.  IMPRESSION: Fluoroscopy for feeding tube placement, as above.   Electronically Signed   By: Jorje Guild M.D.   On: 01/08/2014 02:35        Scheduled Meds: . collagenase   Topical Daily  . erythromycin  250 mg Intravenous 4 times per day  . feeding supplement (RESOURCE BREEZE)  1 Container Oral BID PC  . gabapentin  300 mg Oral QHS  . Glycerin (Adult)  1 suppository Rectal BID  . heparin  5,000 Units Subcutaneous 3 times per day  . insulin aspart  0-15 Units Subcutaneous Q4H  . insulin glargine  20 Units Subcutaneous QHS  . metoCLOPramide (REGLAN)  injection  10 mg Intravenous 4 times per day  . metoprolol  5 mg Intravenous 4 times per day  . pantoprazole (PROTONIX) IV  40 mg Intravenous Q12H  . polyethylene glycol  17 g Oral BID  . senna  2 tablet Oral BID  . sodium chloride  3 mL Intravenous Q12H  . sucralfate  1 g Oral 4 times per day  . sulfamethoxazole-trimethoprim  1 tablet Oral BID   Continuous Infusions:    Principal Problem:   Gastroparesis Active Problems:   IDDM (insulin dependent diabetes mellitus)   HTN (hypertension)   Constipation   Epigastric pain   Type I diabetes mellitus with complication, uncontrolled   Essential hypertension    Time spent: [place], MD [facility] [phone]

## 2014-01-09 LAB — BASIC METABOLIC PANEL
Anion gap: 11 (ref 5–15)
BUN: 11 mg/dL (ref 6–23)
CALCIUM: 8.5 mg/dL (ref 8.4–10.5)
CO2: 25 mEq/L (ref 19–32)
Chloride: 102 mEq/L (ref 96–112)
Creatinine, Ser: 1.21 mg/dL (ref 0.50–1.35)
GFR, EST AFRICAN AMERICAN: 87 mL/min — AB (ref >90)
GFR, EST NON AFRICAN AMERICAN: 75 mL/min — AB (ref >90)
Glucose, Bld: 95 mg/dL (ref 70–99)
Potassium: 3.2 mEq/L — ABNORMAL LOW (ref 3.7–5.3)
SODIUM: 138 meq/L (ref 137–147)

## 2014-01-09 LAB — GLUCOSE, CAPILLARY
GLUCOSE-CAPILLARY: 91 mg/dL (ref 70–99)
GLUCOSE-CAPILLARY: 87 mg/dL (ref 70–99)
Glucose-Capillary: 131 mg/dL — ABNORMAL HIGH (ref 70–99)
Glucose-Capillary: 169 mg/dL — ABNORMAL HIGH (ref 70–99)
Glucose-Capillary: 179 mg/dL — ABNORMAL HIGH (ref 70–99)
Glucose-Capillary: 214 mg/dL — ABNORMAL HIGH (ref 70–99)
Glucose-Capillary: 216 mg/dL — ABNORMAL HIGH (ref 70–99)

## 2014-01-09 MED ORDER — METOCLOPRAMIDE HCL 10 MG PO TABS
10.0000 mg | ORAL_TABLET | Freq: Three times a day (TID) | ORAL | Status: DC
  Administered 2014-01-09 – 2014-01-10 (×4): 10 mg via ORAL
  Filled 2014-01-09 (×7): qty 1

## 2014-01-09 MED ORDER — PANTOPRAZOLE SODIUM 40 MG PO TBEC
40.0000 mg | DELAYED_RELEASE_TABLET | Freq: Two times a day (BID) | ORAL | Status: DC
  Administered 2014-01-09 – 2014-01-10 (×2): 40 mg via ORAL
  Filled 2014-01-09 (×3): qty 1

## 2014-01-09 MED ORDER — POTASSIUM CHLORIDE CRYS ER 20 MEQ PO TBCR
40.0000 meq | EXTENDED_RELEASE_TABLET | Freq: Once | ORAL | Status: AC
  Administered 2014-01-09: 40 meq via ORAL
  Filled 2014-01-09: qty 2

## 2014-01-09 NOTE — Progress Notes (Signed)
PROGRESS NOTE    Duane [name] [id number] DOB: 07-13-1975 DOA: 12/30/2013 PCP: [name], MD  HPI/Brief narrative 38 year old male with history of IDDM with neuropathy, diabetic gastroparesis, essential hypertension, presented with subacute onset of nausea, vomiting and epigastric pain 11/18.  Flex sig normal 4/30, UGI mid sized hiatal hernia  He was treated by PCP for right great toe wound infection with Bactrim.  He states because of $$, he was running out of his medications and not taking them regularly Despite bowel rest and maximal treatment for diabetic gastroparesis, patient continues to be symptomatic of nausea, vomiting and progressing slowly. GI had signed off-requested to follow-up again  And they recommneded NG tube whch canno be placed. Patient vomiting slighlty improved on IV regaln, EEC and graduateed back to regular diet on 11/27 and tolerating fairly   Assessment/Plan:  1. Diabetic gastroparesis: Patient was hospitalized in September 2015 for intractable nausea and vomiting attributed to diabetic gastroparesis. Gastroparesis was confirmed by gastric emptying scan on 08/18/13. GI had been consulted during that admission. Patient kept asking for IV Dilaudid despite advise to reduce by MDs. He was treated empirically with IV Reglan, PPI, erythromycin and antiemetics. Finally he was transferred to [facility] for further management. Had a relapse when placed on regular diet and place dback on IV PPI, Reglan,EEC-Start soft diet again 11/27 and graduated to full diet which tolerating.  D/c IV morphine and changed all meds to oral.  Could potentially d/c home 1-2 days provided tol diet 2. Dehydration: Secondary to problem #1. Continue IV fluids pending adequate oral intake-Bmet is stable 3. Acute renal failure: Likely prerenal from dehydration.Saline d/c ~ 11/27  4. Uncontrolled type I DM with peripheral neuropathy and gastroparesis: Hemoglobin A1c in August was 9.3  suggesting poor control. Continue Lantus and SSI. CBG's 87-214 5. Essential hypertension: Mildly uncontrolled. Continue scheduled IV metoprolol and IV hydralazine when necessary. Change to PO when adequate PO intake. 6. Right great toe wound infection: Clinically improved on OP Bactrim-continue same and d/c on 11/2.   DC clindamycin. Wound care consultation appreciated -recommend follow-up at [facility] for follow-up assessment and sharp debridement once the nonviable tissue has softened.  Will review wound ina m  Hpokalemia: Replace c KDUR 40 Meq once   Code Status: Full Family Communication: None at bedside.  Disposition Plan: Not medically stable for discharge.   Consultants:  Gastroenterology  Procedures:  None   Antibiotics:  Clindamycin-DC   Subjective:  tol all diet No c/o abd pain/n/v No cp Ready to "go home"  Objective: Filed Vitals:   01/08/14 1818 01/08/14 2302 01/09/14 0625 01/09/14 0626  BP: 133/73 142/93 140/80   Pulse: 86 90 66   Temp:  98.2 F (36.8 C) 98 F (36.7 C)   TempSrc:  Oral Oral   Resp:  15 16   Height:      Weight:    119.7 kg (263 lb 14.3 oz)  SpO2:  100% 99%    No intake or output data in the 24 hours ending 01/09/14 1310 Filed Weights   01/07/14 0606 01/08/14 0500 01/09/14 0626  Weight: 117 kg (257 lb 15 oz) 117.2 kg (258 lb 6.1 oz) 119.7 kg (263 lb 14.3 oz)     Exam:  General exam: Pleasant young male sitting up in bed and appears comfortable.  Respiratory system: Clear. No increased work of breathing. Cardiovascular system: S1 & S2 heard, RRR. No JVD, murmurs, gallops, clicks or pedal edema.  Gastrointestinal system:  Abdomen is nondistended, soft and nontender. Normal bowel sounds heard.  Data Reviewed: Basic Metabolic Panel:  Recent Labs Lab 01/05/14 0544 01/06/14 0550 01/07/14 0506 01/09/14 0352  NA 141 144 146 138  K 3.6* 3.9 3.7 3.2*  CL 102 105 106 102  CO2 22 21 24 25   GLUCOSE 160* 140* 126*  95  BUN 12 14 16 11   CREATININE 1.19 1.17 1.22 1.21  CALCIUM 8.9 9.1 9.2 8.5   Liver Function Tests: No results for input(s): AST, ALT, ALKPHOS, BILITOT, PROT, ALBUMIN in the last 168 hours. No results for input(s): LIPASE, AMYLASE in the last 168 hours. No results for input(s): AMMONIA in the last 168 hours. CBC:  Recent Labs Lab 01/07/14 0506  WBC 9.5  NEUTROABS 6.7  HGB 13.6  HCT 41.0  MCV 80.1  PLT 287   Cardiac Enzymes: No results for input(s): CKTOTAL, CKMB, CKMBINDEX, TROPONINI in the last 168 hours. BNP (last 3 results) No results for input(s): PROBNP in the last 8760 hours. CBG:  Recent Labs Lab 01/08/14 2216 01/09/14 0044 01/09/14 0415 01/09/14 0734 01/09/14 1138  GLUCAP 221* 131* 91 87 214*    No results found for this or any previous visit (from the past 240 hour(s)).        Studies: No results found.      Scheduled Meds: . collagenase   Topical Daily  . feeding supplement (RESOURCE BREEZE)  1 Container Oral BID PC  . gabapentin  300 mg Oral QHS  . Glycerin (Adult)  1 suppository Rectal BID  . heparin  5,000 Units Subcutaneous 3 times per day  . insulin aspart  0-15 Units Subcutaneous Q4H  . insulin glargine  20 Units Subcutaneous QHS  . metoCLOPramide  10 mg Oral TID AC & HS  . metoprolol  5 mg Intravenous 4 times per day  . pantoprazole  40 mg Oral BID  . polyethylene glycol  17 g Oral BID  . senna  2 tablet Oral BID  . sodium chloride  3 mL Intravenous Q12H  . sucralfate  1 g Oral 4 times per day  . sulfamethoxazole-trimethoprim  1 tablet Oral BID   Continuous Infusions:    Principal Problem:   Gastroparesis Active Problems:   IDDM (insulin dependent diabetes mellitus)   HTN (hypertension)   Constipation   Epigastric pain   Type I diabetes mellitus with complication, uncontrolled   Essential hypertension    Time spent: [place], MD [facility] [phone]

## 2014-01-09 NOTE — Plan of Care (Signed)
Problem: Discharge Progression Outcomes Goal: Pain controlled with appropriate interventions Outcome: Completed/Met Date Met:  01/09/14     

## 2014-01-10 LAB — CBC WITH DIFFERENTIAL/PLATELET
Basophils Absolute: 0 10*3/uL (ref 0.0–0.1)
Basophils Relative: 1 % (ref 0–1)
Eosinophils Absolute: 0.3 10*3/uL (ref 0.0–0.7)
Eosinophils Relative: 5 % (ref 0–5)
HEMATOCRIT: 34.4 % — AB (ref 39.0–52.0)
Hemoglobin: 11.5 g/dL — ABNORMAL LOW (ref 13.0–17.0)
LYMPHS ABS: 2.1 10*3/uL (ref 0.7–4.0)
LYMPHS PCT: 35 % (ref 12–46)
MCH: 26.5 pg (ref 26.0–34.0)
MCHC: 33.4 g/dL (ref 30.0–36.0)
MCV: 79.3 fL (ref 78.0–100.0)
MONO ABS: 0.5 10*3/uL (ref 0.1–1.0)
MONOS PCT: 8 % (ref 3–12)
NEUTROS ABS: 3.1 10*3/uL (ref 1.7–7.7)
NEUTROS PCT: 51 % (ref 43–77)
Platelets: 249 10*3/uL (ref 150–400)
RBC: 4.34 MIL/uL (ref 4.22–5.81)
RDW: 13 % (ref 11.5–15.5)
WBC: 6.1 10*3/uL (ref 4.0–10.5)

## 2014-01-10 LAB — BASIC METABOLIC PANEL
ANION GAP: 14 (ref 5–15)
BUN: 8 mg/dL (ref 6–23)
CHLORIDE: 102 meq/L (ref 96–112)
CO2: 22 meq/L (ref 19–32)
CREATININE: 1.2 mg/dL (ref 0.50–1.35)
Calcium: 8.8 mg/dL (ref 8.4–10.5)
GFR calc non Af Amer: 76 mL/min — ABNORMAL LOW (ref >90)
GFR, EST AFRICAN AMERICAN: 88 mL/min — AB (ref >90)
Glucose, Bld: 104 mg/dL — ABNORMAL HIGH (ref 70–99)
Potassium: 3.3 mEq/L — ABNORMAL LOW (ref 3.7–5.3)
Sodium: 138 mEq/L (ref 137–147)

## 2014-01-10 LAB — GLUCOSE, CAPILLARY
Glucose-Capillary: 118 mg/dL — ABNORMAL HIGH (ref 70–99)
Glucose-Capillary: 127 mg/dL — ABNORMAL HIGH (ref 70–99)
Glucose-Capillary: 200 mg/dL — ABNORMAL HIGH (ref 70–99)

## 2014-01-10 MED ORDER — PROMETHAZINE HCL 25 MG PO TABS: 25.0000 mg | ORAL_TABLET | Freq: Three times a day (TID) | ORAL | Status: DC | PRN

## 2014-01-10 MED ORDER — CLONIDINE HCL 0.1 MG PO TABS: 0.1000 mg | ORAL_TABLET | Freq: Two times a day (BID) | ORAL | Status: DC

## 2014-01-10 MED ORDER — INSULIN ASPART PROT & ASPART (70-30 MIX) 100 UNIT/ML PEN: 8.0000 [IU] | PEN_INJECTOR | Freq: Two times a day (BID) | SUBCUTANEOUS | Status: DC

## 2014-01-10 MED ORDER — METOPROLOL TARTRATE 50 MG PO TABS: 50.0000 mg | ORAL_TABLET | Freq: Two times a day (BID) | ORAL | Status: DC

## 2014-01-10 MED ORDER — GABAPENTIN 300 MG PO CAPS: 300.0000 mg | ORAL_CAPSULE | Freq: Every day | ORAL | Status: DC

## 2014-01-10 MED ORDER — METOCLOPRAMIDE HCL 10 MG PO TABS: 10.0000 mg | ORAL_TABLET | Freq: Four times a day (QID) | ORAL | Status: DC

## 2014-01-10 MED ORDER — GLUCOSE BLOOD VI STRP: ORAL_STRIP | Status: DC

## 2014-01-10 MED ORDER — PANTOPRAZOLE SODIUM 40 MG PO TBEC: 40.0000 mg | DELAYED_RELEASE_TABLET | Freq: Two times a day (BID) | ORAL | Status: DC

## 2014-01-10 NOTE — Plan of Care (Signed)
Problem: Consults Goal: Skin Care Protocol Initiated - if Braden Score 18 or less If consults are not indicated, leave blank or document N/A  Outcome: Not Applicable Date Met:  21/62/44

## 2014-01-10 NOTE — Progress Notes (Signed)
Nsg Discharge Note  Admit Date:  12/30/2013 Discharge date: 01/10/2014   [name] to be D/C'd Home per MD order.  AVS completed.  Copy for chart, and copy for patient signed, and dated. Patient/caregiver able to verbalize understanding.  Discharge Medication:   Medication List    STOP taking these medications        erythromycin ethylsuccinate 200 MG/5ML suspension  Commonly known as:  EES     HYDROcodone-acetaminophen 5-325 MG per tablet  Commonly known as:  NORCO/VICODIN     insulin aspart 100 UNIT/ML injection  Commonly known as:  novoLOG     insulin glargine 100 UNIT/ML injection  Commonly known as:  LANTUS     LORazepam 1 MG tablet  Commonly known as:  ATIVAN     NOVOLOG FLEXPEN 100 UNIT/ML FlexPen  Generic drug:  insulin aspart     ONETOUCH DELICA LANCETS FINE Misc     polyethylene glycol packet  Commonly known as:  MIRALAX / GLYCOLAX     sucralfate 1 G tablet  Commonly known as:  CARAFATE     sulfamethoxazole-trimethoprim 800-160 MG per tablet  Commonly known as:  BACTRIM DS,SEPTRA DS     traMADol 50 MG tablet  Commonly known as:  ULTRAM      TAKE these medications        acetaminophen 500 MG tablet  Commonly known as:  TYLENOL  Take 500 mg by mouth every 6 (six) hours as needed for mild pain.     amLODipine 10 MG tablet  Commonly known as:  NORVASC  Take 1 tablet (10 mg total) by mouth every morning.     cloNIDine 0.1 MG tablet  Commonly known as:  CATAPRES  Take 1 tablet (0.1 mg total) by mouth 2 (two) times daily.     gabapentin 300 MG capsule  Commonly known as:  NEURONTIN  Take 1 capsule (300 mg total) by mouth at bedtime.     glucose blood test strip  Commonly known as:  ONE TOUCH ULTRA TEST  Use as instructed to check blood sugar once a day dx code 250.00     Insulin Aspart Prot & Aspart (70-30) 100 UNIT/ML Pen  Commonly known as:  NOVOLOG 70/30 MIX  Inject 8 Units into the skin 2 (two) times daily.     metoCLOPramide 10 MG  tablet  Commonly known as:  REGLAN  Take 1 tablet (10 mg total) by mouth 4 (four) times daily.     metoprolol 50 MG tablet  Commonly known as:  LOPRESSOR  Take 1 tablet (50 mg total) by mouth 2 (two) times daily.     pantoprazole 40 MG tablet  Commonly known as:  PROTONIX  Take 1 tablet (40 mg total) by mouth 2 (two) times daily.     promethazine 25 MG tablet  Commonly known as:  PHENERGAN  Take 1 tablet (25 mg total) by mouth every 8 (eight) hours as needed for nausea or vomiting.        Discharge Assessment: Filed Vitals:   01/10/14 0500  BP: 126/77  Pulse: 76  Temp: 98.2 F (36.8 C)  Resp: 20   Patient has wound on great toe. IV catheter discontinued intact. Site without signs and symptoms of complications - no redness or edema noted at insertion site, patient denies c/o pain - only slight tenderness at site.  Dressing with slight pressure applied.  D/c Instructions-Education: Discharge instructions given to patient/family with verbalized understanding. D/c education completed with  patient/family including follow up instructions, medication list, d/c activities limitations if indicated, with other d/c instructions as indicated by MD - patient able to verbalize understanding, all questions fully answered. Patient instructed to return to ED, call 911, or call MD for any changes in condition.  Patient escorted via [place], and D/C home via private auto.  Mackynzie Woolford Margaretha Sheffield, RN 01/10/2014 2:11 PM

## 2014-01-10 NOTE — Discharge Summary (Signed)
Physician Discharge Summary  Duane [name] [id number] DOB: December 28, 1975 DOA: 12/30/2013  PCP: [name], MD  Admit date: 12/30/2013 Discharge date: 01/10/2014  Time spent: 35 minutes  Recommendations for Outpatient Follow-up:  1. Patient stressed compliance on insulin as well as Reglan and other medications 2. Needs close follow-up as OP  3. Note medication changes  Discharge Diagnoses:  Principal Problem:   Gastroparesis Active Problems:   IDDM (insulin dependent diabetes mellitus)   HTN (hypertension)   Constipation   Epigastric pain   Type I diabetes mellitus with complication, uncontrolled   Essential hypertension   Discharge Condition: fair  Diet recommendation: diabetic hh  Filed Weights   01/08/14 0500 01/09/14 0626 01/10/14 0500  Weight: 117.2 kg (258 lb 6.1 oz) 119.7 kg (263 lb 14.3 oz) 121.8 kg (268 lb 8.3 oz)    History of present illness:  38 year old male with history of IDDM with neuropathy, diabetic gastroparesis, essential hypertension, presented with subacute onset of nausea, vomiting and epigastric pain 11/18. Flex sig normal 4/30, UGI mid sized hiatal hernia He was treated by PCP for right great toe wound infection with Bactrim. He states because of $$, he was running out of his medications and not taking them regularly Despite bowel rest and maximal treatment for diabetic gastroparesis, patient continues to be symptomatic of nausea, vomiting and progressing slowly. GI had signed off-requested to follow-up again And they recommneded NG tube whch canno be placed. Patient vomiting slighlty improved on IV reglan, EEC and graduateed back to regular diet on 11/27 and tolerating fairly He spent a prolonged amount of time in the hospital because of waxing and waning symptoms of nausea and other issues He was give a 1 month Rx for all of his medications and urged compliance and will follow with the [facility] to ensure that his symptoms  improve. He was changed off of Lantus to 7030 insulin 8 units twice a day and expressly told to increase the dose to 10 units if sugars remain above 250 on checking.  he had a diabetic foot wound right great toe which improved on Bactrim and he discontinued antibiotics. He did not need to have any debridement of this  Consultations:  Gastroenterology  Discharge Exam: Filed Vitals:   01/10/14 0500  BP: 126/77  Pulse: 76  Temp: 98.2 F (36.8 C)  Resp: 20    General: EOMI NCAT Cardiovascular: S1-S2 no murmur rub or gallop Respiratory: Clear Foot wound is clean  Discharge Instructions You were cared for by a hospitalist during your hospital stay. If you have any questions about your discharge medications or the care you received while you were in the hospital after you are discharged, you can call the unit and asked to speak with the hospitalist on call if the hospitalist that took care of you is not available. Once you are discharged, your primary care physician will handle any further medical issues. Please note that NO REFILLS for any discharge medications will be authorized once you are discharged, as it is imperative that you return to your primary care physician (or establish a relationship with a primary care physician if you do not have one) for your aftercare needs so that they can reassess your need for medications and monitor your lab values.   Current Discharge Medication List    START taking these medications   Details  Insulin Aspart Prot & Aspart (NOVOLOG 70/30 MIX) (70-30) 100 UNIT/ML Pen Inject 8 Units into the skin 2 (two) times  daily. Qty: 15 mL, Refills: 1      CONTINUE these medications which have CHANGED   Details  cloNIDine (CATAPRES) 0.1 MG tablet Take 1 tablet (0.1 mg total) by mouth 2 (two) times daily. Qty: 60 tablet, Refills: 0    gabapentin (NEURONTIN) 300 MG capsule Take 1 capsule (300 mg total) by mouth at bedtime. Qty: 30 capsule, Refills: 0     glucose blood (ONE TOUCH ULTRA TEST) test strip Use as instructed to check blood sugar once a day dx code 250.00 Qty: 100 each, Refills: 0    metoCLOPramide (REGLAN) 10 MG tablet Take 1 tablet (10 mg total) by mouth 4 (four) times daily. Qty: 120 tablet, Refills: 0    metoprolol (LOPRESSOR) 50 MG tablet Take 1 tablet (50 mg total) by mouth 2 (two) times daily. Qty: 60 tablet, Refills: 0    pantoprazole (PROTONIX) 40 MG tablet Take 1 tablet (40 mg total) by mouth 2 (two) times daily. Qty: 60 tablet, Refills: 0    promethazine (PHENERGAN) 25 MG tablet Take 1 tablet (25 mg total) by mouth every 8 (eight) hours as needed for nausea or vomiting. Qty: 60 tablet, Refills: 0      CONTINUE these medications which have NOT CHANGED   Details  acetaminophen (TYLENOL) 500 MG tablet Take 500 mg by mouth every 6 (six) hours as needed for mild pain.     amLODipine (NORVASC) 10 MG tablet Take 1 tablet (10 mg total) by mouth every morning. Qty: 30 tablet, Refills: 3      STOP taking these medications     insulin aspart (NOVOLOG) 100 UNIT/ML injection      insulin glargine (LANTUS) 100 UNIT/ML injection      sucralfate (CARAFATE) 1 G tablet      sulfamethoxazole-trimethoprim (BACTRIM DS,SEPTRA DS) 800-160 MG per tablet      traMADol (ULTRAM) 50 MG tablet      erythromycin ethylsuccinate (EES) 200 MG/5ML suspension      HYDROcodone-acetaminophen (NORCO/VICODIN) 5-325 MG per tablet      insulin glargine (LANTUS) 100 UNIT/ML injection      LORazepam (ATIVAN) 1 MG tablet      NOVOLOG FLEXPEN 100 UNIT/ML FlexPen      ONETOUCH DELICA LANCETS FINE MISC      polyethylene glycol (MIRALAX / GLYCOLAX) packet      promethazine (PHENERGAN) 25 MG suppository        No Known Allergies    The results of significant diagnostics from this hospitalization (including imaging, microbiology, ancillary and laboratory) are listed below for reference.    Significant Diagnostic Studies: Dg Fluoro  Rm 1-60 Min  01/25/14   CLINICAL DATA:  Nausea  EXAM: FLOURO RM 1-60 MIN  : COMPARISON:  None  FINDINGS: Single frontal image of the upper chest and neck is submitted. The image shows a feeding tube with tip at the level of the mid neck, at the level hypopharynx and larynx. Reportedly, no tube was left in place.  IMPRESSION: Fluoroscopy for feeding tube placement, as above.   Electronically Signed   By: Jorje Guild M.D.   On: January 25, 2014 02:35   Dg Abd Acute W/chest  12/30/2013   CLINICAL DATA:  Vomiting and abdominal pain  EXAM: ACUTE ABDOMEN SERIES (ABDOMEN 2 VIEW & CHEST 1 VIEW)  COMPARISON:  11/16/2013  FINDINGS: Cardiac shadow is within normal limits.  The lungs are clear.  Scattered large and small bowel gas is noted. Fecal material is noted throughout the colon. No  obstructive changes are seen. No abnormal mass or abnormal calcifications are noted. No free air is seen.  IMPRESSION: Moderate fecal burden.  No acute abdominal abnormality is noted.   Electronically Signed   By: Inez Catalina M.D.   On: 12/30/2013 23:49    Microbiology: No results found for this or any previous visit (from the past 240 hour(s)).   Labs: Basic Metabolic Panel:  Recent Labs Lab 01/05/14 0544 01/06/14 0550 01/07/14 0506 01/09/14 0352 01/10/14 0455  NA 141 144 146 138 138  K 3.6* 3.9 3.7 3.2* 3.3*  CL 102 105 106 102 102  CO2 22 21 24 25 22   GLUCOSE 160* 140* 126* 95 104*  BUN 12 14 16 11 8   CREATININE 1.19 1.17 1.22 1.21 1.20  CALCIUM 8.9 9.1 9.2 8.5 8.8   Liver Function Tests: No results for input(s): AST, ALT, ALKPHOS, BILITOT, PROT, ALBUMIN in the last 168 hours. No results for input(s): LIPASE, AMYLASE in the last 168 hours. No results for input(s): AMMONIA in the last 168 hours. CBC:  Recent Labs Lab 01/07/14 0506 01/10/14 0455  WBC 9.5 6.1  NEUTROABS 6.7 3.1  HGB 13.6 11.5*  HCT 41.0 34.4*  MCV 80.1 79.3  PLT 287 249   Cardiac Enzymes: No results for input(s): CKTOTAL, CKMB,  CKMBINDEX, TROPONINI in the last 168 hours. BNP: BNP (last 3 results) No results for input(s): PROBNP in the last 8760 hours. CBG:  Recent Labs Lab 01/09/14 1637 01/09/14 2029 01/09/14 2358 01/10/14 0357 01/10/14 0805  GLUCAP 216* 169* 179* 118* 127*       Signed:  [name]  [facility] 01/10/2014, 9:59 AM

## 2014-01-10 NOTE — Plan of Care (Signed)
Problem: Discharge Progression Outcomes Goal: Tolerating diet Outcome: Completed/Met Date Met:  01/10/14     

## 2014-02-03 ENCOUNTER — Inpatient Hospital Stay (HOSPITAL_COMMUNITY): Payer: Medicaid Other

## 2014-02-03 ENCOUNTER — Encounter (HOSPITAL_COMMUNITY): Payer: Self-pay | Admitting: *Deleted

## 2014-02-03 ENCOUNTER — Inpatient Hospital Stay (HOSPITAL_COMMUNITY)
Admission: EM | Admit: 2014-02-03 | Discharge: 2014-02-05 | DRG: 074 | Disposition: A | Discharge status: Home / Self Care | Payer: Medicaid Other | Attending: Internal Medicine | Admitting: Internal Medicine

## 2014-02-03 DIAGNOSIS — Z6833 Body mass index (BMI) 33.0-33.9, adult: Secondary | ICD-10-CM

## 2014-02-03 DIAGNOSIS — E1140 Type 2 diabetes mellitus with diabetic neuropathy, unspecified: Secondary | ICD-10-CM | POA: Diagnosis present

## 2014-02-03 DIAGNOSIS — N179 Acute kidney failure, unspecified: Secondary | ICD-10-CM | POA: Diagnosis present

## 2014-02-03 DIAGNOSIS — R1110 Vomiting, unspecified: Secondary | ICD-10-CM

## 2014-02-03 DIAGNOSIS — R112 Nausea with vomiting, unspecified: Secondary | ICD-10-CM | POA: Diagnosis present

## 2014-02-03 DIAGNOSIS — R1115 Cyclical vomiting syndrome unrelated to migraine: Secondary | ICD-10-CM

## 2014-02-03 DIAGNOSIS — Z79899 Other long term (current) drug therapy: Secondary | ICD-10-CM | POA: Diagnosis not present

## 2014-02-03 DIAGNOSIS — I10 Essential (primary) hypertension: Secondary | ICD-10-CM

## 2014-02-03 DIAGNOSIS — E669 Obesity, unspecified: Secondary | ICD-10-CM | POA: Diagnosis present

## 2014-02-03 DIAGNOSIS — Z833 Family history of diabetes mellitus: Secondary | ICD-10-CM | POA: Diagnosis not present

## 2014-02-03 DIAGNOSIS — E860 Dehydration: Secondary | ICD-10-CM | POA: Diagnosis present

## 2014-02-03 DIAGNOSIS — E109 Type 1 diabetes mellitus without complications: Secondary | ICD-10-CM

## 2014-02-03 DIAGNOSIS — Z794 Long term (current) use of insulin: Secondary | ICD-10-CM

## 2014-02-03 DIAGNOSIS — G43A0 Cyclical vomiting, not intractable: Secondary | ICD-10-CM

## 2014-02-03 DIAGNOSIS — K3184 Gastroparesis: Secondary | ICD-10-CM

## 2014-02-03 DIAGNOSIS — E1143 Type 2 diabetes mellitus with diabetic autonomic (poly)neuropathy: Secondary | ICD-10-CM | POA: Diagnosis present

## 2014-02-03 LAB — CBC WITH DIFFERENTIAL/PLATELET
BASOS ABS: 0 10*3/uL (ref 0.0–0.1)
BASOS PCT: 0 % (ref 0–1)
EOS PCT: 0 % (ref 0–5)
Eosinophils Absolute: 0 10*3/uL (ref 0.0–0.7)
HCT: 38.7 % — ABNORMAL LOW (ref 39.0–52.0)
Hemoglobin: 12.6 g/dL — ABNORMAL LOW (ref 13.0–17.0)
LYMPHS PCT: 19 % (ref 12–46)
Lymphs Abs: 1.8 10*3/uL (ref 0.7–4.0)
MCH: 26.4 pg (ref 26.0–34.0)
MCHC: 32.6 g/dL (ref 30.0–36.0)
MCV: 81.1 fL (ref 78.0–100.0)
Monocytes Absolute: 0.6 10*3/uL (ref 0.1–1.0)
Monocytes Relative: 7 % (ref 3–12)
Neutro Abs: 6.9 10*3/uL (ref 1.7–7.7)
Neutrophils Relative %: 74 % (ref 43–77)
PLATELETS: 240 10*3/uL (ref 150–400)
RBC: 4.77 MIL/uL (ref 4.22–5.81)
RDW: 13.3 % (ref 11.5–15.5)
WBC: 9.4 10*3/uL (ref 4.0–10.5)

## 2014-02-03 LAB — COMPREHENSIVE METABOLIC PANEL
ALBUMIN: 4.4 g/dL (ref 3.5–5.2)
ALT: 20 U/L (ref 0–53)
AST: 23 U/L (ref 0–37)
Alkaline Phosphatase: 85 U/L (ref 39–117)
Anion gap: 12 (ref 5–15)
BUN: 19 mg/dL (ref 6–23)
CO2: 24 mmol/L (ref 19–32)
Calcium: 9.1 mg/dL (ref 8.4–10.5)
Chloride: 100 mEq/L (ref 96–112)
Creatinine, Ser: 1.84 mg/dL — ABNORMAL HIGH (ref 0.50–1.35)
GFR calc Af Amer: 52 mL/min — ABNORMAL LOW (ref >90)
GFR calc non Af Amer: 45 mL/min — ABNORMAL LOW (ref >90)
Glucose, Bld: 264 mg/dL — ABNORMAL HIGH (ref 70–99)
Potassium: 3.8 mmol/L (ref 3.5–5.1)
Sodium: 136 mmol/L (ref 135–145)
Total Bilirubin: 0.8 mg/dL (ref 0.3–1.2)
Total Protein: 7.2 g/dL (ref 6.0–8.3)

## 2014-02-03 LAB — URINALYSIS, ROUTINE W REFLEX MICROSCOPIC
Bilirubin Urine: NEGATIVE
Glucose, UA: >1000 mg/dL — AB
Ketones, ur: 15 mg/dL — AB
LEUKOCYTES UA: NEGATIVE
Nitrite: NEGATIVE
PH: 5 (ref 5.0–8.0)
Protein, ur: 30 mg/dL — AB
SPECIFIC GRAVITY, URINE: 1.025 (ref 1.005–1.030)
Urobilinogen, UA: 0.2 mg/dL (ref 0.0–1.0)

## 2014-02-03 LAB — GLUCOSE, CAPILLARY
GLUCOSE-CAPILLARY: 122 mg/dL — AB (ref 70–99)
Glucose-Capillary: 181 mg/dL — ABNORMAL HIGH (ref 70–99)
Glucose-Capillary: 116 mg/dL — ABNORMAL HIGH (ref 70–99)
Glucose-Capillary: 171 mg/dL — ABNORMAL HIGH (ref 70–99)

## 2014-02-03 LAB — URINE MICROSCOPIC-ADD ON

## 2014-02-03 LAB — HEMOGLOBIN A1C
Hgb A1c MFr Bld: 8.9 % — ABNORMAL HIGH (ref <5.7)
Mean Plasma Glucose: 209 mg/dL — ABNORMAL HIGH (ref <117)

## 2014-02-03 MED ORDER — LORAZEPAM 2 MG/ML IJ SOLN
1.0000 mg | Freq: Once | INTRAMUSCULAR | Status: AC
  Administered 2014-02-03: 1 mg via INTRAVENOUS
  Filled 2014-02-03: qty 1

## 2014-02-03 MED ORDER — GUAIFENESIN-DM 100-10 MG/5ML PO SYRP: 5.0000 mL | ORAL_SOLUTION | ORAL | Status: DC | PRN

## 2014-02-03 MED ORDER — POLYETHYLENE GLYCOL 3350 17 G PO PACK: 17.0000 g | PACK | Freq: Every day | ORAL | Status: DC | PRN

## 2014-02-03 MED ORDER — PROMETHAZINE HCL 25 MG PO TABS: 25.0000 mg | ORAL_TABLET | Freq: Three times a day (TID) | ORAL | Status: DC | PRN

## 2014-02-03 MED ORDER — HYDROMORPHONE HCL 1 MG/ML IJ SOLN
1.0000 mg | Freq: Once | INTRAMUSCULAR | Status: AC
  Administered 2014-02-03: 1 mg via INTRAVENOUS
  Filled 2014-02-03: qty 1

## 2014-02-03 MED ORDER — AMLODIPINE BESYLATE 10 MG PO TABS: 10.0000 mg | ORAL_TABLET | Freq: Every morning | ORAL | Status: DC

## 2014-02-03 MED ORDER — HYDROCODONE-ACETAMINOPHEN 5-325 MG PO TABS
1.0000 | ORAL_TABLET | ORAL | Status: DC | PRN
  Administered 2014-02-03 – 2014-02-05 (×4): 2 via ORAL
  Filled 2014-02-03 (×4): qty 2

## 2014-02-03 MED ORDER — METOPROLOL TARTRATE 50 MG PO TABS
50.0000 mg | ORAL_TABLET | Freq: Two times a day (BID) | ORAL | Status: DC
  Administered 2014-02-03 – 2014-02-05 (×5): 50 mg via ORAL
  Filled 2014-02-03 (×8): qty 1

## 2014-02-03 MED ORDER — ONDANSETRON HCL 4 MG/2ML IJ SOLN
4.0000 mg | Freq: Four times a day (QID) | INTRAMUSCULAR | Status: DC | PRN
  Administered 2014-02-03: 4 mg via INTRAVENOUS
  Filled 2014-02-03: qty 2

## 2014-02-03 MED ORDER — METOCLOPRAMIDE HCL 10 MG PO TABS
10.0000 mg | ORAL_TABLET | Freq: Three times a day (TID) | ORAL | Status: DC
  Administered 2014-02-03 – 2014-02-05 (×10): 10 mg via ORAL
  Filled 2014-02-03 (×12): qty 1

## 2014-02-03 MED ORDER — PANTOPRAZOLE SODIUM 40 MG PO TBEC
40.0000 mg | DELAYED_RELEASE_TABLET | Freq: Two times a day (BID) | ORAL | Status: DC
  Administered 2014-02-03 – 2014-02-05 (×5): 40 mg via ORAL
  Filled 2014-02-03 (×5): qty 1

## 2014-02-03 MED ORDER — SODIUM CHLORIDE 0.9 % IV SOLN
Freq: Once | INTRAVENOUS | Status: AC
  Administered 2014-02-03: 04:00:00 via INTRAVENOUS

## 2014-02-03 MED ORDER — PROMETHAZINE HCL 25 MG/ML IJ SOLN
25.0000 mg | Freq: Once | INTRAMUSCULAR | Status: AC
  Administered 2014-02-03: 25 mg via INTRAVENOUS
  Filled 2014-02-03: qty 1

## 2014-02-03 MED ORDER — METOCLOPRAMIDE HCL 5 MG/ML IJ SOLN
10.0000 mg | Freq: Once | INTRAMUSCULAR | Status: AC
  Administered 2014-02-03: 10 mg via INTRAVENOUS
  Filled 2014-02-03: qty 2

## 2014-02-03 MED ORDER — HEPARIN SODIUM (PORCINE) 5000 UNIT/ML IJ SOLN
5000.0000 [IU] | Freq: Three times a day (TID) | INTRAMUSCULAR | Status: DC
  Administered 2014-02-03 – 2014-02-05 (×5): 5000 [IU] via SUBCUTANEOUS
  Filled 2014-02-03 (×10): qty 1

## 2014-02-03 MED ORDER — INSULIN ASPART 100 UNIT/ML ~~LOC~~ SOLN
0.0000 [IU] | SUBCUTANEOUS | Status: DC
  Administered 2014-02-03 – 2014-02-04 (×3): 2 [IU] via SUBCUTANEOUS
  Administered 2014-02-04: 1 [IU] via SUBCUTANEOUS

## 2014-02-03 MED ORDER — SODIUM CHLORIDE 0.9 % IV BOLUS (SEPSIS)
1000.0000 mL | Freq: Once | INTRAVENOUS | Status: AC
  Administered 2014-02-03: 1000 mL via INTRAVENOUS

## 2014-02-03 MED ORDER — DIPHENHYDRAMINE HCL 50 MG/ML IJ SOLN
25.0000 mg | Freq: Once | INTRAMUSCULAR | Status: AC
  Administered 2014-02-03: 25 mg via INTRAVENOUS
  Filled 2014-02-03: qty 1

## 2014-02-03 MED ORDER — ONDANSETRON HCL 4 MG PO TABS
4.0000 mg | ORAL_TABLET | Freq: Four times a day (QID) | ORAL | Status: DC | PRN
  Administered 2014-02-05: 4 mg via ORAL
  Filled 2014-02-03: qty 1

## 2014-02-03 MED ORDER — CLONIDINE HCL 0.1 MG PO TABS
0.1000 mg | ORAL_TABLET | Freq: Two times a day (BID) | ORAL | Status: DC
  Administered 2014-02-03 – 2014-02-05 (×5): 0.1 mg via ORAL
  Filled 2014-02-03 (×8): qty 1

## 2014-02-03 MED ORDER — SODIUM CHLORIDE 0.9 % IV SOLN
INTRAVENOUS | Status: AC
  Administered 2014-02-03 – 2014-02-04 (×3): via INTRAVENOUS

## 2014-02-03 MED ORDER — SODIUM CHLORIDE 0.9 % IV BOLUS (SEPSIS)
1000.0000 mL | Freq: Once | INTRAVENOUS | Status: AC
  Administered 2014-02-04: 1000 mL via INTRAVENOUS

## 2014-02-03 MED ORDER — GABAPENTIN 300 MG PO CAPS
300.0000 mg | ORAL_CAPSULE | Freq: Every day | ORAL | Status: DC
  Administered 2014-02-03 – 2014-02-04 (×2): 300 mg via ORAL
  Filled 2014-02-03 (×4): qty 1

## 2014-02-03 MED ORDER — AMLODIPINE BESYLATE 5 MG PO TABS
5.0000 mg | ORAL_TABLET | Freq: Every morning | ORAL | Status: DC
  Administered 2014-02-03 – 2014-02-05 (×3): 5 mg via ORAL
  Filled 2014-02-03 (×4): qty 1

## 2014-02-03 NOTE — Progress Notes (Signed)
UR completed.  Jeanne Terrance, RN BSN [facility] CCM Trauma/Neuro ICU Case Manager [phone]  

## 2014-02-03 NOTE — ED Notes (Signed)
Patient presents via EMS with N/V stating earlier on the 22nd.  +vomiting approximately 20-30 times since the AM.  History of gastroparisis

## 2014-02-03 NOTE — ED Provider Notes (Signed)
CSN: [id number]     Arrival date & time 02/03/14  0147 History   First MD Initiated Contact with Patient 02/03/14 0208    This chart was scribed for Kalman Drape, MD by Terressa Koyanagi, ED Scribe. This patient was seen in room D34C/D34C and the patient's care was started at 2:55 AM.  Chief Complaint  Patient presents with  . Nausea  . Emesis   The history is provided by the patient. No language interpreter was used.   PCP: [name], MD HPI Comments: Duane [name] is a 38 y.o. male, brought in by ambulance, with medical Hx noted below and significant for gastroparesis, HTN and DM, who presents to the Emergency Department complaining of 20-30 episodes of emesis with associated nausea and throbbing, constant abd pain onset yesterday morning. Pt reports taking reglan, protonix, and lopresssor for his gastroparesis. Pt states he is compliant with his meds. Pt reports he uses Phenergan for nausea, however, denies using it in repository form. Pt reports his glucose levels was in the 200s today. Pt denies being followed by Gastroenterology. Pt was last admitted to the hospital on 12/30/13 with gastroparesis.   Past Medical History  Diagnosis Date  . Hypertension   . Diabetes mellitus without complication age 53    insulin requiring from the start  . Gastritis   . Gastroparesis 2012    100% gastric retention on 08/2013 [facility]  . Obesity   . Diabetic neuropathy    Past Surgical History  Procedure Laterality Date  . Cholecystectomy  2013  . Knee surgery Left   . Flexible sigmoidoscopy N/A 06/11/2013    Procedure: FLEXIBLE SIGMOIDOSCOPY;  Surgeon: [name], MD;  Location: [facility] ENDOSCOPY;  Service: Endoscopy;  Laterality: N/A;  . Esophagogastroduodenoscopy N/A 06/11/2013    Procedure: ESOPHAGOGASTRODUODENOSCOPY (EGD);  Surgeon: [name], MD;  Location: [facility];  Service: Endoscopy;  Laterality: N/A;   Family History  Problem Relation Age of Onset  . Diabetes Father    History   Substance Use Topics  . Smoking status: Never Smoker   . Smokeless tobacco: Never Used  . Alcohol Use: Yes     Comment: "none really" - 08/27/13    Review of Systems  Constitutional: Negative for fever and chills.  Gastrointestinal: Positive for nausea, vomiting and abdominal pain.  Psychiatric/Behavioral: Negative for confusion.   Allergies  Review of patient's allergies indicates no known allergies.  Home Medications   Prior to Admission medications   Medication Sig Start Date End Date Taking? Authorizing Provider  acetaminophen (TYLENOL) 500 MG tablet Take 500 mg by mouth every 6 (six) hours as needed for mild pain.     Historical Provider, MD  amLODipine (NORVASC) 10 MG tablet Take 1 tablet (10 mg total) by mouth every morning. 08/29/13   Venetia Maxon Rama, MD  cloNIDine (CATAPRES) 0.1 MG tablet Take 1 tablet (0.1 mg total) by mouth 2 (two) times daily. 01/10/14   [name], MD  gabapentin (NEURONTIN) 300 MG capsule Take 1 capsule (300 mg total) by mouth at bedtime. 01/10/14   [name], MD  glucose blood (ONE TOUCH ULTRA TEST) test strip Use as instructed to check blood sugar once a day dx code 250.00 01/10/14   [name], MD  Insulin Aspart Prot & Aspart (NOVOLOG 70/30 MIX) (70-30) 100 UNIT/ML Pen Inject 8 Units into the skin 2 (two) times daily. 01/10/14   [name], MD  metoCLOPramide (REGLAN) 10 MG tablet Take 1 tablet (10 mg total)  by mouth 4 (four) times daily. 01/10/14   [name], MD  metoprolol (LOPRESSOR) 50 MG tablet Take 1 tablet (50 mg total) by mouth 2 (two) times daily. 01/10/14   [name], MD  pantoprazole (PROTONIX) 40 MG tablet Take 1 tablet (40 mg total) by mouth 2 (two) times daily. 01/10/14   [name], MD  promethazine (PHENERGAN) 25 MG tablet Take 1 tablet (25 mg total) by mouth every 8 (eight) hours as needed for nausea or vomiting. 01/10/14   [name], MD   Triage Vitals: BP  167/98 mmHg  Pulse 118  Temp(Src) 98.7 F (37.1 C) (Oral)  Resp 23  Ht 6\' 3"  (1.905 m)  Wt 270 lb (122.471 kg)  BMI 33.75 kg/m2  SpO2 100% Physical Exam  Constitutional: He is oriented to person, place, and time. He appears well-developed and well-nourished. No distress.  HENT:  Head: Normocephalic and atraumatic.  Eyes: Conjunctivae and EOM are normal.  Neck: Neck supple.  Cardiovascular: Normal rate.   Pulmonary/Chest: Effort normal. No respiratory distress.  Musculoskeletal: Normal range of motion.  Neurological: He is alert and oriented to person, place, and time.  Skin: Skin is warm and dry.  Psychiatric: He has a normal mood and affect. His behavior is normal.  Nursing note and vitals reviewed.   ED Course  Procedures (including critical care time) DIAGNOSTIC STUDIES: Oxygen Saturation is 100% on RA, nl by my interpretation.    COORDINATION OF CARE: 3:00 AM-Discussed treatment plan which includes meds and labs with pt at bedside and pt agreed to plan.   Labs Review Labs Reviewed  CBC WITH DIFFERENTIAL - Abnormal; Notable for the following:    Hemoglobin 12.6 (*)    HCT 38.7 (*)    All other components within normal limits  COMPREHENSIVE METABOLIC PANEL - Abnormal; Notable for the following:    Glucose, Bld 264 (*)    Creatinine, Ser 1.84 (*)    GFR calc non Af Amer 45 (*)    GFR calc Af Amer 52 (*)    All other components within normal limits  URINALYSIS, ROUTINE W REFLEX MICROSCOPIC    Imaging Review No results found.   EKG Interpretation None      MDM   Final diagnoses:  Intractable cyclical vomiting with nausea  Gastroparesis    38 year old male history of gastroparesis who presents with onset of intractable vomiting today.  Plan for IV hydration, Reglan Benadryl Ativan, Dilaudid.  Will reassess   I personally performed the services described in this documentation, which was scribed in my presence. The recorded information has been reviewed  and is accurate.    5:24 AM Patient reports only mild improvement in symptoms.  No further vomiting noted.  He has had a increase in his creatinine from baseline of 1-1.8.  Patient to receive further IV fluids and Phenergan.  Will discuss with hospitalist for admission for observation for rehydration and gastroparesis   Kalman Drape, MD 02/03/14 [phone]

## 2014-02-03 NOTE — Progress Notes (Signed)
Pt has refused to eat all day d/t feeling nauseous.  Zofran and reglan have been given and pt currently states he is feeling less nauseous.  Will continue to monitor. Duane [name]

## 2014-02-03 NOTE — ED Notes (Addendum)
EMS reported BS 218, BP 130 palp, pulse 124, resp 16 Zofran 4mg 

## 2014-02-03 NOTE — H&P (Signed)
Patient Demographics  Duane [name], is a 38 y.o. male  MRN: [id number]   DOB - [birth year]  Admit Date - 02/03/2014  Outpatient Primary MD for the patient is NNODI, Doreene Burke, MD   With History of -  Past Medical History  Diagnosis Date  . Hypertension   . Diabetes mellitus without complication age 54    insulin requiring from the start  . Gastritis   . Gastroparesis 2012    100% gastric retention on 08/2013 [facility]  . Obesity   . Diabetic neuropathy       Past Surgical History  Procedure Laterality Date  . Cholecystectomy  2013  . Knee surgery Left   . Flexible sigmoidoscopy N/A 06/11/2013    Procedure: FLEXIBLE SIGMOIDOSCOPY;  Surgeon: [name], MD;  Location: [facility];  Service: Endoscopy;  Laterality: N/A;  . Esophagogastroduodenoscopy N/A 06/11/2013    Procedure: ESOPHAGOGASTRODUODENOSCOPY (EGD);  Surgeon: [name], MD;  Location: [facility];  Service: Endoscopy;  Laterality: N/A;    in for   Chief Complaint  Patient presents with  . Nausea  . Emesis     HPI  Duane [name]  is a 38 y.o. male, with history of type 2 diabetes mellitus, diabetic gastroparesis, diabetic neuropathy, essential hypertension comes to the hospital with 1-2 day history of nausea and vomiting, he says this is because of his diabetic gastroparesis, denies any abdominal pain or diarrhea, in the ER was noted to be dehydrated and in acute renal failure and hospitalist team was called for admission.  He is currently feeling better and currently his review of systems are unremarkable, he sleeping in the bed, currently denies any nausea or abdominal pain, last emesis several hours ago. No blood in stool or urine, no blood in vomitus, no chest pain, no focal weakness. No exposure to sick contacts or bad food.    Review  of Systems    In addition to the HPI above,   No Fever-chills, No Headache, No changes with Vision or hearing, No problems swallowing food or Liquids, No Chest pain, Cough or Shortness of Breath, No Abdominal pain, +ve Nausea & Vommitting, Bowel movements are regular, No Blood in stool or Urine, No dysuria, No new skin rashes or bruises, No new joints pains-aches,  No new weakness, tingling, numbness in any extremity, No recent weight gain or loss, No polyuria, polydypsia or polyphagia, No significant Mental Stressors.  A full 10 point Review of Systems was done, except as stated above, all other Review of Systems were negative.   Social History History  Substance Use Topics  . Smoking status: Never Smoker   . Smokeless tobacco: Never Used  . Alcohol Use: Yes     Comment: "none really" - 08/27/13      Family History Family History  Problem Relation Age of Onset  . Diabetes Father       Prior to Admission medications   Medication Sig Start Date  End Date Taking? Authorizing Provider  acetaminophen (TYLENOL) 500 MG tablet Take 500 mg by mouth every 6 (six) hours as needed for mild pain.    Yes Historical Provider, MD  amLODipine (NORVASC) 5 MG tablet Take 5 mg by mouth daily. 01/12/14  Yes Historical Provider, MD  cloNIDine (CATAPRES) 0.1 MG tablet Take 1 tablet (0.1 mg total) by mouth 2 (two) times daily. 01/10/14  Yes [name], MD  gabapentin (NEURONTIN) 300 MG capsule Take 1 capsule (300 mg total) by mouth at bedtime. 01/10/14  Yes [name], MD  Insulin Aspart Prot & Aspart (NOVOLOG 70/30 MIX) (70-30) 100 UNIT/ML Pen Inject 8 Units into the skin 2 (two) times daily. 01/10/14  Yes [name], MD  metoCLOPramide (REGLAN) 10 MG tablet Take 1 tablet (10 mg total) by mouth 4 (four) times daily. 01/10/14  Yes [name], MD  metoprolol (LOPRESSOR) 50 MG tablet Take 1 tablet (50 mg total) by mouth 2 (two) times daily. 01/10/14  Yes  [name], MD  pantoprazole (PROTONIX) 40 MG tablet Take 1 tablet (40 mg total) by mouth 2 (two) times daily. 01/10/14  Yes [name], MD  promethazine (PHENERGAN) 25 MG tablet Take 1 tablet (25 mg total) by mouth every 8 (eight) hours as needed for nausea or vomiting. 01/10/14  Yes [name], MD  glucose blood (ONE TOUCH ULTRA TEST) test strip Use as instructed to check blood sugar once a day dx code 250.00 01/10/14   [name], MD    No Known Allergies  Physical Exam  Vitals  Blood pressure 156/90, pulse 95, temperature 98.1 F (36.7 C), temperature source Oral, resp. rate 16, height 6\' 3"  (1.905 m), weight 122.471 kg (270 lb), SpO2 100 %.   1. General young AA male lying in bed in NAD,     2. Normal affect and insight, Not Suicidal or Homicidal, Awake Alert, Oriented X 3.  3. No F.N deficits, ALL C.Nerves Intact, Strength 5/5 all 4 extremities, Sensation intact all 4 extremities, Plantars down going.  4. Ears and Eyes appear Normal, Conjunctivae clear, PERRLA. Moist Oral Mucosa.  5. Supple Neck, No JVD, No cervical lymphadenopathy appriciated, No Carotid Bruits.  6. Symmetrical Chest wall movement, Good air movement bilaterally, CTAB.  7. RRR, No Gallops, Rubs or Murmurs, No Parasternal Heave.  8. Positive Bowel Sounds, Abdomen Soft, No tenderness, No organomegaly appriciated,No rebound -guarding or rigidity.  9.  No Cyanosis, Normal Skin Turgor, No Skin Rash or Bruise.  10. Good muscle tone,  joints appear normal , no effusions, Normal ROM.  11. No Palpable Lymph Nodes in Neck or Axillae     Data Review  CBC  Recent Labs Lab 02/03/14 0217  WBC 9.4  HGB 12.6*  HCT 38.7*  PLT 240  MCV 81.1  MCH 26.4  MCHC 32.6  RDW 13.3  LYMPHSABS 1.8  MONOABS 0.6  EOSABS 0.0  BASOSABS 0.0   ------------------------------------------------------------------------------------------------------------------  Chemistries   Recent  Labs Lab 02/03/14 0217  NA 136  K 3.8  CL 100  CO2 24  GLUCOSE 264*  BUN 19  CREATININE 1.84*  CALCIUM 9.1  AST 23  ALT 20  ALKPHOS 85  BILITOT 0.8   ------------------------------------------------------------------------------------------------------------------ estimated creatinine clearance is 77.5 mL/min (by C-G formula based on Cr of 1.84). ------------------------------------------------------------------------------------------------------------------ No results for input(s): TSH, T4TOTAL, T3FREE, THYROIDAB in the last 72 hours.  Invalid input(s): FREET3   Coagulation profile No results for input(s): INR, PROTIME in the last 168 hours. ------------------------------------------------------------------------------------------------------------------- No  results for input(s): DDIMER in the last 72 hours. -------------------------------------------------------------------------------------------------------------------  Cardiac Enzymes No results for input(s): CKMB, TROPONINI, MYOGLOBIN in the last 168 hours.  Invalid input(s): CK ------------------------------------------------------------------------------------------------------------------ Invalid input(s): POCBNP   ---------------------------------------------------------------------------------------------------------------  Urinalysis    Component Value Date/Time   COLORURINE YELLOW 02/03/2014 [place] 02/03/2014 0648   LABSPEC 1.025 02/03/2014 0648   PHURINE 5.0 02/03/2014 0648   GLUCOSEU >1000* 02/03/2014 0648   HGBUR TRACE* 02/03/2014 0648   BILIRUBINUR NEGATIVE 02/03/2014 0648   KETONESUR 15* 02/03/2014 0648   PROTEINUR 30* 02/03/2014 0648   UROBILINOGEN 0.2 02/03/2014 0648   NITRITE NEGATIVE 02/03/2014 0648   LEUKOCYTESUR NEGATIVE 02/03/2014 0648    ----------------------------------------------------------------------------------------------------------------  Imaging  results:   Dg Abd Portable 1v  02/03/2014   CLINICAL DATA:  Nausea and vomiting  EXAM: PORTABLE ABDOMEN - 1 VIEW  COMPARISON:  06/07/2013  FINDINGS: The bowel gas pattern is normal. No radio-opaque calculi or other significant radiographic abnormality are seen.  IMPRESSION: Negative.   Electronically Signed   By: Kerby Moors M.D.   On: 02/03/2014 08:15         Assessment & Plan   1. Nausea vomiting due to acute on chronic diabetic gastroparesis. Currently better, bowel rest with clear liquid diet, supportive care with antiemetics, continue PPI, IV fluids, KUB stable. Monitor.   2. ARF due to dehydration from #1 above. Hydrate and monitor.   3. Essential hypertension. Continue home regimen.   4. DM type II. Will place on sliding scale insulin every 4 hours for now, will check A1c.   5. Diabetic neuropathy. Continue Neurontin.    DVT Prophylaxis Heparin    AM Labs Ordered, also please review Full Orders  Family Communication: Admission, patients condition and plan of care including tests being ordered have been discussed with the patient who indicates understanding and agree with the plan and Code Status.  Code Status Full  Likely DC to  Home  Condition Fair  Time spent in minutes : 35    Taye Cato K M.D on 02/03/2014 at 1:02 PM  Between 7am to 7pm - Pager - [phone]  After 7pm go to [email or web address] - [facility] Office  [phone]

## 2014-02-03 NOTE — Progress Notes (Signed)
Pt refusing SCDs at this time.  Will continue to encourage use and educate pt. Graceann Congress

## 2014-02-03 NOTE — Progress Notes (Signed)
[facility] admissions paged regarding patient's arrival to unit.  Will wait for orders. Duane [name]

## 2014-02-04 DIAGNOSIS — R112 Nausea with vomiting, unspecified: Secondary | ICD-10-CM

## 2014-02-04 DIAGNOSIS — E0841 Diabetes mellitus due to underlying condition with diabetic mononeuropathy: Secondary | ICD-10-CM

## 2014-02-04 LAB — BASIC METABOLIC PANEL
Anion gap: 8 (ref 5–15)
BUN: 11 mg/dL (ref 6–23)
CALCIUM: 8.3 mg/dL — AB (ref 8.4–10.5)
CO2: 25 mmol/L (ref 19–32)
Chloride: 103 mEq/L (ref 96–112)
Creatinine, Ser: 1.19 mg/dL (ref 0.50–1.35)
GFR calc non Af Amer: 80 mL/min — ABNORMAL LOW (ref >90)
Glucose, Bld: 115 mg/dL — ABNORMAL HIGH (ref 70–99)
Potassium: 3.9 mmol/L (ref 3.5–5.1)
Sodium: 136 mmol/L (ref 135–145)

## 2014-02-04 LAB — CBC
HCT: 34.7 % — ABNORMAL LOW (ref 39.0–52.0)
Hemoglobin: 10.9 g/dL — ABNORMAL LOW (ref 13.0–17.0)
MCH: 26 pg (ref 26.0–34.0)
MCHC: 31.4 g/dL (ref 30.0–36.0)
MCV: 82.8 fL (ref 78.0–100.0)
PLATELETS: 220 10*3/uL (ref 150–400)
RBC: 4.19 MIL/uL — ABNORMAL LOW (ref 4.22–5.81)
RDW: 13.3 % (ref 11.5–15.5)
WBC: 4.4 10*3/uL (ref 4.0–10.5)

## 2014-02-04 LAB — GLUCOSE, CAPILLARY
GLUCOSE-CAPILLARY: 202 mg/dL — AB (ref 70–99)
GLUCOSE-CAPILLARY: 172 mg/dL — AB (ref 70–99)
GLUCOSE-CAPILLARY: 113 mg/dL — AB (ref 70–99)
Glucose-Capillary: 106 mg/dL — ABNORMAL HIGH (ref 70–99)
Glucose-Capillary: 118 mg/dL — ABNORMAL HIGH (ref 70–99)
Glucose-Capillary: 129 mg/dL — ABNORMAL HIGH (ref 70–99)
Glucose-Capillary: 178 mg/dL — ABNORMAL HIGH (ref 70–99)

## 2014-02-04 MED ORDER — INSULIN ASPART 100 UNIT/ML ~~LOC~~ SOLN
0.0000 [IU] | Freq: Three times a day (TID) | SUBCUTANEOUS | Status: DC
  Administered 2014-02-04 – 2014-02-05 (×2): 3 [IU] via SUBCUTANEOUS
  Administered 2014-02-05 (×2): 2 [IU] via SUBCUTANEOUS

## 2014-02-04 MED ORDER — INSULIN ASPART PROT & ASPART (70-30 MIX) 100 UNIT/ML ~~LOC~~ SUSP
8.0000 [IU] | Freq: Two times a day (BID) | SUBCUTANEOUS | Status: DC
  Administered 2014-02-04 – 2014-02-05 (×3): 8 [IU] via SUBCUTANEOUS
  Filled 2014-02-04: qty 10

## 2014-02-04 MED ORDER — INSULIN ASPART 100 UNIT/ML ~~LOC~~ SOLN: 0.0000 [IU] | Freq: Every day | SUBCUTANEOUS | Status: DC

## 2014-02-04 NOTE — Progress Notes (Signed)
[facility] PROGRESS NOTE  Duane [name] [id number] DOB: 1975/05/08 DOA: 02/03/2014 PCP: [name], MD  Assessment/Plan: 1. Acute exacerbation of diabetic gastroparesis -Patient showing clinical improvement today, will advance his diet as tolerated -Continue Reglan 10 mg by mouth 3 times a day scheduled -As needed Phenergan for nausea/vomiting -Supportive care, anticipate discharge in the next 24 hours if remains stable.\  2.  Type 2 diabetes mellitus -Will restart his NovoLog 70/30 8 units of cutaneous twice a day as his diet is being advanced today. -Continue Accu-Cheks before every meal and daily at bedtime with sliding scale coverage  3.  Hypertension. -Blood pressures are stable -Continue amlodipine 5 mg by mouth daily, clonidine 0.1 mg by mouth twice a day, metoprolol 50 mg by mouth twice a day  4.  Diabetic neuropathy. -Continue gabapentin 300 mg by mouth daily at bedtime  5.  DVT prophylaxis -Subcutaneous heparin  Code Status: Full code Family Communication:  Disposition Plan: Anticipate discharge in the next 24 hours    HPI/Subjective: Patient is a pleasant 38 year old gentleman with a past medical history of type 2 diabetes mellitus, diabetic gastroparesis, diabetic neuropathy who was admitted to the medicine service on 02/03/2014 presenting with multiple episodes of nausea and vomiting. He reported unable to hold by mouth down. Symptoms felt to be secondary to acute exacerbation of diabetic gastroparesis. Initial workup included a KUB which showed normal bowel gas pattern. He was administered as needed IV antiemetic therapy, IV fluids, and placed on clears. Patient's diet was slowly advanced.  Objective: Filed Vitals:   02/04/14 1323  BP: 111/65  Pulse: 70  Temp: 97.6 F (36.4 C)  Resp: 15    Intake/Output Summary (Last 24 hours) at 02/04/14 1605 Last data filed at 02/04/14 0630  Gross per 24 hour  Intake   2420 ml  Output    595 ml  Net    1825 ml   Filed Weights   02/03/14 0157 02/04/14 0534  Weight: 122.471 kg (270 lb) 126.8 kg (279 lb 8.7 oz)    Exam:   General:  No acute distress patient is awake alert oriented  Cardiovascular: Regular rate and rhythm normal S1-S2 no murmurs rubs or gallops  Respiratory: Normal respiratory effort, clear to auscultation bilaterally  Abdomen: Soft nontender nondistended  Musculoskeletal: No edema  Data Reviewed: Basic Metabolic Panel:  Recent Labs Lab 02/03/14 0217 02/04/14 0654  NA 136 136  K 3.8 3.9  CL 100 103  CO2 24 25  GLUCOSE 264* 115*  BUN 19 11  CREATININE 1.84* 1.19  CALCIUM 9.1 8.3*   Liver Function Tests:  Recent Labs Lab 02/03/14 0217  AST 23  ALT 20  ALKPHOS 85  BILITOT 0.8  PROT 7.2  ALBUMIN 4.4   No results for input(s): LIPASE, AMYLASE in the last 168 hours. No results for input(s): AMMONIA in the last 168 hours. CBC:  Recent Labs Lab 02/03/14 0217 02/04/14 0654  WBC 9.4 4.4  NEUTROABS 6.9  --   HGB 12.6* 10.9*  HCT 38.7* 34.7*  MCV 81.1 82.8  PLT 240 220   Cardiac Enzymes: No results for input(s): CKTOTAL, CKMB, CKMBINDEX, TROPONINI in the last 168 hours. BNP (last 3 results) No results for input(s): PROBNP in the last 8760 hours. CBG:  Recent Labs Lab 02/03/14 2357 02/04/14 0321 02/04/14 0745 02/04/14 1259 02/04/14 1533  GLUCAP 129* 106* 118* 178* 202*    No results found for this or any previous visit (from the past 240 hour(s)).  Studies: Dg Abd Portable 1v  02/03/2014   CLINICAL DATA:  Nausea and vomiting  EXAM: PORTABLE ABDOMEN - 1 VIEW  COMPARISON:  06/07/2013  FINDINGS: The bowel gas pattern is normal. No radio-opaque calculi or other significant radiographic abnormality are seen.  IMPRESSION: Negative.   Electronically Signed   By: Kerby Moors M.D.   On: 02/03/2014 08:15    Scheduled Meds: . amLODipine  5 mg Oral q morning - 10a  . cloNIDine  0.1 mg Oral BID  . gabapentin  300 mg Oral QHS  .  heparin  5,000 Units Subcutaneous 3 times per day  . insulin aspart  0-15 Units Subcutaneous TID WC  . insulin aspart  0-5 Units Subcutaneous QHS  . metoCLOPramide  10 mg Oral TID AC & HS  . metoprolol  50 mg Oral BID  . pantoprazole  40 mg Oral BID   Continuous Infusions:   Principal Problem:   Nausea & vomiting Active Problems:   IDDM (insulin dependent diabetes mellitus)   Intractable nausea and vomiting   Diabetic gastroparesis   Diabetic neuropathy   Acute kidney injury   Essential hypertension    Time spent: 35 minutes    [name]  [facility] Pager [phone]. If 7PM-7AM, please contact night-coverage at [email or web address], password [facility] 02/04/2014, 4:05 PM  LOS: 1 day

## 2014-02-05 DIAGNOSIS — E0843 Diabetes mellitus due to underlying condition with diabetic autonomic (poly)neuropathy: Secondary | ICD-10-CM

## 2014-02-05 LAB — GLUCOSE, CAPILLARY
GLUCOSE-CAPILLARY: 131 mg/dL — AB (ref 70–99)
GLUCOSE-CAPILLARY: 141 mg/dL — AB (ref 70–99)
Glucose-Capillary: 168 mg/dL — ABNORMAL HIGH (ref 70–99)

## 2014-02-05 LAB — CBC
HCT: 34.5 % — ABNORMAL LOW (ref 39.0–52.0)
Hemoglobin: 11.1 g/dL — ABNORMAL LOW (ref 13.0–17.0)
MCH: 26.2 pg (ref 26.0–34.0)
MCHC: 32.2 g/dL (ref 30.0–36.0)
MCV: 81.6 fL (ref 78.0–100.0)
PLATELETS: 230 10*3/uL (ref 150–400)
RBC: 4.23 MIL/uL (ref 4.22–5.81)
RDW: 12.9 % (ref 11.5–15.5)
WBC: 4.8 10*3/uL (ref 4.0–10.5)

## 2014-02-05 LAB — BASIC METABOLIC PANEL
ANION GAP: 6 (ref 5–15)
BUN: 9 mg/dL (ref 6–23)
CALCIUM: 8.7 mg/dL (ref 8.4–10.5)
CO2: 27 mmol/L (ref 19–32)
Chloride: 105 mEq/L (ref 96–112)
Creatinine, Ser: 1.17 mg/dL (ref 0.50–1.35)
GFR calc Af Amer: >90 mL/min (ref >90)
GFR, EST NON AFRICAN AMERICAN: 78 mL/min — AB (ref >90)
GLUCOSE: 153 mg/dL — AB (ref 70–99)
POTASSIUM: 3.6 mmol/L (ref 3.5–5.1)
Sodium: 138 mmol/L (ref 135–145)

## 2014-02-05 MED ORDER — GABAPENTIN 300 MG PO CAPS: 300.0000 mg | ORAL_CAPSULE | Freq: Every day | ORAL | Status: DC

## 2014-02-05 MED ORDER — INSULIN ASPART PROT & ASPART (70-30 MIX) 100 UNIT/ML PEN: 8.0000 [IU] | PEN_INJECTOR | Freq: Two times a day (BID) | SUBCUTANEOUS | Status: DC

## 2014-02-05 MED ORDER — GLUCOSE BLOOD VI STRP: ORAL_STRIP | Status: DC

## 2014-02-05 MED ORDER — METOCLOPRAMIDE HCL 10 MG PO TABS: 10.0000 mg | ORAL_TABLET | Freq: Four times a day (QID) | ORAL | Status: DC

## 2014-02-05 MED ORDER — METOPROLOL TARTRATE 50 MG PO TABS: 50.0000 mg | ORAL_TABLET | Freq: Two times a day (BID) | ORAL | Status: DC

## 2014-02-05 MED ORDER — SUCRALFATE 1 G PO TABS: 1.0000 g | ORAL_TABLET | Freq: Three times a day (TID) | ORAL | Status: DC

## 2014-02-05 MED ORDER — CLONIDINE HCL 0.1 MG PO TABS: 0.1000 mg | ORAL_TABLET | Freq: Two times a day (BID) | ORAL | Status: DC

## 2014-02-05 MED ORDER — AMLODIPINE BESYLATE 10 MG PO TABS: 10.0000 mg | ORAL_TABLET | Freq: Every morning | ORAL | Status: DC

## 2014-02-05 NOTE — Discharge Summary (Signed)
Physician Discharge Summary  Duane [name] [id number] DOB: [birth year] DOA: 02/03/2014  PCP: Duane Pound, MD  Admit date: 02/03/2014 Discharge date: 02/05/2014  Time spent: 35 minutes  Recommendations for Outpatient Follow-up:  1. Please follow up on patient's gastroparesis 2. He reported losing his health insurance for which Case Manager was consulted to assist with prescriptions and setting him up with hospital follow up  Discharge Diagnoses:  Principal Problem:   Nausea & vomiting Active Problems:   IDDM (insulin dependent diabetes mellitus)   Intractable nausea and vomiting   Diabetic gastroparesis   Diabetic neuropathy   Acute kidney injury   Essential hypertension   Discharge Condition: Stable  Diet recommendation: Carb Modified   Filed Weights   02/03/14 0157 02/04/14 0534 02/05/14 0545  Weight: 122.471 kg (270 lb) 126.8 kg (279 lb 8.7 oz) 127.2 kg (280 lb 6.8 oz)    History of present illness:  Duane [name] is a 38 y.o. male, with history of type 2 diabetes mellitus, diabetic gastroparesis, diabetic neuropathy, essential hypertension comes to the hospital with 1-2 day history of nausea and vomiting, he says this is because of his diabetic gastroparesis, denies any abdominal pain or diarrhea, in the ER was noted to be dehydrated and in acute renal failure and hospitalist team was called for admission.  He is currently feeling better and currently his review of systems are unremarkable, he sleeping in the bed, currently denies any nausea or abdominal pain, last emesis several hours ago. No blood in stool or urine, no blood in vomitus, no chest pain, no focal weakness. No exposure to sick contacts or bad food.  Hospital Course:  Patient is a pleasant 38 year old gentleman with a past medical history of type 2 diabetes mellitus, diabetic gastroparesis, diabetic neuropathy who was admitted to the medicine service on 02/03/2014 presenting with multiple episodes of  nausea and vomiting. He reported unable to hold by mouth down. Symptoms felt to be secondary to acute exacerbation of diabetic gastroparesis. Initial workup included a KUB which showed normal bowel gas pattern. He was administered as needed IV antiemetic therapy, IV fluids, and placed on clears. Patient's diet was slowly advanced. By 02/05/2014 he was tolerating a car modified diet. Blood sugars remained controlled. He expressed concerns over recently losing his health insurance for which case manager was consulted to assist him with prescriptions along with getting him to follow-up appointment. Patient was discharged home and in stable condition on 02/05/2014  Consultations:  Case manager  Discharge Exam: Duane [name] Vitals:   02/05/14 0545  BP: 130/76  Pulse: 63  Temp: 97.9 F (36.6 C)  Resp: 16    General: Patient is awake alert oriented, states feeling better, completed 100% of his male Cardiovascular: Regular rate and rhythm normal S1-S2 no murmurs rubs or gallops Respiratory: Normal respiratory effort, lungs are clear to auscultation Abdomen: Soft nontender nondistended  Discharge Instructions   Discharge Instructions    Call MD for:  difficulty breathing, headache or visual disturbances    Complete by:  As directed      Call MD for:  extreme fatigue    Complete by:  As directed      Call MD for:  hives    Complete by:  As directed      Call MD for:  persistant dizziness or light-headedness    Complete by:  As directed      Call MD for:  persistant nausea and vomiting    Complete by:  As directed  Call MD for:  redness, tenderness, or signs of infection (pain, swelling, redness, odor or green/yellow discharge around incision site)    Complete by:  As directed      Call MD for:  severe uncontrolled pain    Complete by:  As directed      Call MD for:  temperature >100.4    Complete by:  As directed      Diet - low sodium heart healthy    Complete by:  As directed       Increase activity slowly    Complete by:  As directed           Current Discharge Medication List    START taking these medications   Details  sucralfate (CARAFATE) 1 G tablet Take 1 tablet (1 g total) by mouth 4 (four) times daily -  with meals and at bedtime. Qty: 90 tablet, Refills: 1      CONTINUE these medications which have CHANGED   Details  amLODipine (NORVASC) 10 MG tablet Take 1 tablet (10 mg total) by mouth every morning. Qty: 30 tablet, Refills: 3    cloNIDine (CATAPRES) 0.1 MG tablet Take 1 tablet (0.1 mg total) by mouth 2 (two) times daily. Qty: 60 tablet, Refills: 0    gabapentin (NEURONTIN) 300 MG capsule Take 1 capsule (300 mg total) by mouth at bedtime. Qty: 30 capsule, Refills: 0    glucose blood (ONE TOUCH ULTRA TEST) test strip Use as instructed to check blood sugar once a day dx code 250.00 Qty: 100 each, Refills: 0    Insulin Aspart Prot & Aspart (NOVOLOG 70/30 MIX) (70-30) 100 UNIT/ML Pen Inject 8 Units into the skin 2 (two) times daily. Qty: 15 mL, Refills: 1    metoCLOPramide (REGLAN) 10 MG tablet Take 1 tablet (10 mg total) by mouth 4 (four) times daily. Qty: 120 tablet, Refills: 0    metoprolol (LOPRESSOR) 50 MG tablet Take 1 tablet (50 mg total) by mouth 2 (two) times daily. Qty: 60 tablet, Refills: 0      CONTINUE these medications which have NOT CHANGED   Details  acetaminophen (TYLENOL) 500 MG tablet Take 500 mg by mouth every 6 (six) hours as needed for mild pain.     pantoprazole (PROTONIX) 40 MG tablet Take 1 tablet (40 mg total) by mouth 2 (two) times daily. Qty: 60 tablet, Refills: 0    promethazine (PHENERGAN) 25 MG tablet Take 1 tablet (25 mg total) by mouth every 8 (eight) hours as needed for nausea or vomiting. Qty: 60 tablet, Refills: 0       No Known Allergies Follow-up Information    Follow up with NNODI, ADAKU, MD In 1 week.   Specialty:  Family Medicine   Contact information:   [place]  [address] [phone]        The results of significant diagnostics from this hospitalization (including imaging, microbiology, ancillary and laboratory) are listed below for reference.    Significant Diagnostic Studies: Dg Fluoro Rm 1-60 Min  01/08/2014   CLINICAL DATA:  Nausea  EXAM: FLOURO RM 1-60 MIN  : COMPARISON:  None  FINDINGS: Single frontal image of the upper chest and neck is submitted. The image shows a feeding tube with tip at the level of the mid neck, at the level hypopharynx and larynx. Reportedly, no tube was left in place.  IMPRESSION: Fluoroscopy for feeding tube placement, as above.   Electronically Signed   By: Roderic Palau  Watts M.D.   On: 01/08/2014 02:35   Dg Abd Portable 1v  02/03/2014   CLINICAL DATA:  Nausea and vomiting  EXAM: PORTABLE ABDOMEN - 1 VIEW  COMPARISON:  06/07/2013  FINDINGS: The bowel gas pattern is normal. No radio-opaque calculi or other significant radiographic abnormality are seen.  IMPRESSION: Negative.   Electronically Signed   By: Kerby Moors M.D.   On: 02/03/2014 08:15    Microbiology: No results found for this or any previous visit (from the past 240 hour(s)).   Labs: Basic Metabolic Panel:  Recent Labs Lab 02/03/14 0217 02/04/14 0654 02/05/14 0440  NA 136 136 138  K 3.8 3.9 3.6  CL 100 103 105  CO2 24 25 27   GLUCOSE 264* 115* 153*  BUN 19 11 9   CREATININE 1.84* 1.19 1.17  CALCIUM 9.1 8.3* 8.7   Liver Function Tests:  Recent Labs Lab 02/03/14 0217  AST 23  ALT 20  ALKPHOS 85  BILITOT 0.8  PROT 7.2  ALBUMIN 4.4   No results for input(s): LIPASE, AMYLASE in the last 168 hours. No results for input(s): AMMONIA in the last 168 hours. CBC:  Recent Labs Lab 02/03/14 0217 02/04/14 0654 02/05/14 0440  WBC 9.4 4.4 4.8  NEUTROABS 6.9  --   --   HGB 12.6* 10.9* 11.1*  HCT 38.7* 34.7* 34.5*  MCV 81.1 82.8 81.6  PLT 240 220 230   Cardiac Enzymes: No results for input(s): CKTOTAL, CKMB, CKMBINDEX, TROPONINI in the  last 168 hours. BNP: BNP (last 3 results) No results for input(s): PROBNP in the last 8760 hours. CBG:  Recent Labs Lab 02/04/14 1259 02/04/14 1533 02/04/14 1740 02/04/14 2136 02/05/14 0759  GLUCAP 178* 202* 172* 113* 131*       Signed:  [name]  [facility] 02/05/2014, 9:36 AM

## 2014-02-05 NOTE — Progress Notes (Addendum)
CARE MANAGEMENT NOTE 02/06/2014  Patient:  Duane [name], Duane [name]   Account Number:  [id number]  Date Initiated:  02/05/2014  Documentation initiated by:  Laurena Slimmer  Subjective/Objective Assessment:     Action/Plan:   Referral to the [facility] for follow up  Referral for Financial Counsel [employer] and [facility]   Anticipated DC Date:  02/05/2014   Anticipated DC Plan:  [place]  CM consult  [facility]      Choice offered to / List presented to:  C-1 Patient           Status of service:  Completed, signed off Medicare Important Message given?   (If response is "NO", the following Medicare IM given date fields will be blank) Date Medicare IM given:   Medicare IM given by:   Date Additional Medicare IM given:   Additional Medicare IM given by:    Discharge Disposition:  HOME/SELF CARE  Per UR Regulation:    If discussed at Long Length of Stay Meetings, dates discussed:    Comments:  02/05/14 14:12 Wendi Maya RN BSN NCM [phone] ED CM received consult for medication assistance. Patient is being discharged from 6N today. Reviewed patient record, and patient was discharged on 01/13/2014 and received MATCH medication assistance at that time, spoke with Laverta Baltimore from [facility] inpatient pharmacy concerning this issue, she will extend time on [facility] medication assistance program.  Spoke with patient, he reports currently being uninsured, and receiving medications assistance earlier in the month explained we will provide him with a aletter of medication assistance to take to any of the participating pharmacies along with $3.00 per prescription patient verbalized understanding and appreciation, and is agreeable with disposition plan . Reiterated that medication assistance is only once within a calendar year.  Discussed the [facility] and services that are provided. Patient interested in f/u at the [facility]. Patient instructed to walk in on Monday 12/28  to schedule follow appt and to take prescription for True Results glucometer to [facility] pharmacy to have fill.  Patient verbalized understanding teach back done. Updated with Coralyn Mark RN on disposition plan, will send [place] letter to [facility] email to print and give to patient.  Financial counseling appt also scheduled for [employer] and [employer] for 1/22 at 1600 patient made aware.  No further CM needs identified.

## 2014-02-16 ENCOUNTER — Encounter (HOSPITAL_COMMUNITY): Payer: Self-pay | Admitting: *Deleted

## 2014-02-16 ENCOUNTER — Inpatient Hospital Stay (HOSPITAL_COMMUNITY)
Admission: EM | Admit: 2014-02-16 | Discharge: 2014-02-18 | DRG: 074 | Disposition: A | Discharge status: Home / Self Care | Payer: Medicaid Other | Attending: Internal Medicine | Admitting: Internal Medicine

## 2014-02-16 DIAGNOSIS — K2970 Gastritis, unspecified, without bleeding: Secondary | ICD-10-CM | POA: Diagnosis present

## 2014-02-16 DIAGNOSIS — Z6832 Body mass index (BMI) 32.0-32.9, adult: Secondary | ICD-10-CM

## 2014-02-16 DIAGNOSIS — Z9119 Patient's noncompliance with other medical treatment and regimen: Secondary | ICD-10-CM | POA: Diagnosis present

## 2014-02-16 DIAGNOSIS — N179 Acute kidney failure, unspecified: Secondary | ICD-10-CM | POA: Diagnosis present

## 2014-02-16 DIAGNOSIS — Z794 Long term (current) use of insulin: Secondary | ICD-10-CM

## 2014-02-16 DIAGNOSIS — E1043 Type 1 diabetes mellitus with diabetic autonomic (poly)neuropathy: Secondary | ICD-10-CM | POA: Diagnosis present

## 2014-02-16 DIAGNOSIS — Z599 Problem related to housing and economic circumstances, unspecified: Secondary | ICD-10-CM

## 2014-02-16 DIAGNOSIS — Z9049 Acquired absence of other specified parts of digestive tract: Secondary | ICD-10-CM | POA: Diagnosis present

## 2014-02-16 DIAGNOSIS — K3184 Gastroparesis: Secondary | ICD-10-CM | POA: Diagnosis present

## 2014-02-16 DIAGNOSIS — E669 Obesity, unspecified: Secondary | ICD-10-CM | POA: Diagnosis present

## 2014-02-16 DIAGNOSIS — R112 Nausea with vomiting, unspecified: Secondary | ICD-10-CM | POA: Insufficient documentation

## 2014-02-16 DIAGNOSIS — I10 Essential (primary) hypertension: Secondary | ICD-10-CM | POA: Diagnosis present

## 2014-02-16 DIAGNOSIS — E1143 Type 2 diabetes mellitus with diabetic autonomic (poly)neuropathy: Secondary | ICD-10-CM

## 2014-02-16 DIAGNOSIS — E119 Type 2 diabetes mellitus without complications: Secondary | ICD-10-CM

## 2014-02-16 DIAGNOSIS — E109 Type 1 diabetes mellitus without complications: Secondary | ICD-10-CM

## 2014-02-16 DIAGNOSIS — IMO0001 Reserved for inherently not codable concepts without codable children: Secondary | ICD-10-CM

## 2014-02-16 DIAGNOSIS — E1040 Type 1 diabetes mellitus with diabetic neuropathy, unspecified: Secondary | ICD-10-CM | POA: Diagnosis present

## 2014-02-16 DIAGNOSIS — R1110 Vomiting, unspecified: Secondary | ICD-10-CM

## 2014-02-16 LAB — BASIC METABOLIC PANEL
ANION GAP: 15 (ref 5–15)
BUN: 16 mg/dL (ref 6–23)
CHLORIDE: 100 meq/L (ref 96–112)
CO2: 23 mmol/L (ref 19–32)
CREATININE: 2.07 mg/dL — AB (ref 0.50–1.35)
Calcium: 9.6 mg/dL (ref 8.4–10.5)
GFR calc Af Amer: 45 mL/min — ABNORMAL LOW (ref >90)
GFR calc non Af Amer: 39 mL/min — ABNORMAL LOW (ref >90)
Glucose, Bld: 365 mg/dL — ABNORMAL HIGH (ref 70–99)
Potassium: 4.7 mmol/L (ref 3.5–5.1)
SODIUM: 138 mmol/L (ref 135–145)

## 2014-02-16 LAB — CBC WITH DIFFERENTIAL/PLATELET
Basophils Absolute: 0 10*3/uL (ref 0.0–0.1)
Basophils Relative: 0 % (ref 0–1)
EOS PCT: 0 % (ref 0–5)
Eosinophils Absolute: 0 10*3/uL (ref 0.0–0.7)
HCT: 40.3 % (ref 39.0–52.0)
HEMOGLOBIN: 13.4 g/dL (ref 13.0–17.0)
LYMPHS ABS: 1 10*3/uL (ref 0.7–4.0)
Lymphocytes Relative: 10 % — ABNORMAL LOW (ref 12–46)
MCH: 26.8 pg (ref 26.0–34.0)
MCHC: 33.3 g/dL (ref 30.0–36.0)
MCV: 80.6 fL (ref 78.0–100.0)
Monocytes Absolute: 0.2 10*3/uL (ref 0.1–1.0)
Monocytes Relative: 2 % — ABNORMAL LOW (ref 3–12)
Neutro Abs: 8.6 10*3/uL — ABNORMAL HIGH (ref 1.7–7.7)
Neutrophils Relative %: 88 % — ABNORMAL HIGH (ref 43–77)
Platelets: 328 10*3/uL (ref 150–400)
RBC: 5 MIL/uL (ref 4.22–5.81)
RDW: 12.9 % (ref 11.5–15.5)
WBC: 9.8 10*3/uL (ref 4.0–10.5)

## 2014-02-16 LAB — GLUCOSE, CAPILLARY
GLUCOSE-CAPILLARY: 290 mg/dL — AB (ref 70–99)
GLUCOSE-CAPILLARY: 252 mg/dL — AB (ref 70–99)
GLUCOSE-CAPILLARY: 157 mg/dL — AB (ref 70–99)
Glucose-Capillary: 155 mg/dL — ABNORMAL HIGH (ref 70–99)
Glucose-Capillary: 132 mg/dL — ABNORMAL HIGH (ref 70–99)

## 2014-02-16 MED ORDER — PANTOPRAZOLE SODIUM 40 MG PO TBEC
40.0000 mg | DELAYED_RELEASE_TABLET | Freq: Two times a day (BID) | ORAL | Status: DC
  Administered 2014-02-16 – 2014-02-18 (×5): 40 mg via ORAL
  Filled 2014-02-16 (×5): qty 1

## 2014-02-16 MED ORDER — METOCLOPRAMIDE HCL 5 MG/ML IJ SOLN
10.0000 mg | Freq: Four times a day (QID) | INTRAMUSCULAR | Status: DC
  Administered 2014-02-16 – 2014-02-18 (×10): 10 mg via INTRAVENOUS
  Filled 2014-02-16 (×15): qty 2

## 2014-02-16 MED ORDER — GABAPENTIN 300 MG PO CAPS
300.0000 mg | ORAL_CAPSULE | Freq: Every day | ORAL | Status: DC
  Administered 2014-02-16 – 2014-02-17 (×2): 300 mg via ORAL
  Filled 2014-02-16 (×3): qty 1

## 2014-02-16 MED ORDER — INSULIN NPH (HUMAN) (ISOPHANE) 100 UNIT/ML ~~LOC~~ SUSP
4.0000 [IU] | Freq: Two times a day (BID) | SUBCUTANEOUS | Status: DC
  Administered 2014-02-17 – 2014-02-18 (×3): 4 [IU] via SUBCUTANEOUS
  Filled 2014-02-16 (×3): qty 10

## 2014-02-16 MED ORDER — INSULIN ASPART 100 UNIT/ML ~~LOC~~ SOLN
0.0000 [IU] | SUBCUTANEOUS | Status: DC
  Administered 2014-02-16: 5 [IU] via SUBCUTANEOUS
  Administered 2014-02-16 – 2014-02-17 (×4): 2 [IU] via SUBCUTANEOUS
  Administered 2014-02-17: 1 [IU] via SUBCUTANEOUS
  Administered 2014-02-18 (×3): 2 [IU] via SUBCUTANEOUS

## 2014-02-16 MED ORDER — METOPROLOL TARTRATE 50 MG PO TABS
50.0000 mg | ORAL_TABLET | Freq: Two times a day (BID) | ORAL | Status: DC
  Administered 2014-02-16 – 2014-02-18 (×5): 50 mg via ORAL
  Filled 2014-02-16 (×6): qty 1

## 2014-02-16 MED ORDER — HYDROMORPHONE HCL 1 MG/ML IJ SOLN: 1.0000 mg | Freq: Once | INTRAMUSCULAR | Status: DC

## 2014-02-16 MED ORDER — HEPARIN SODIUM (PORCINE) 5000 UNIT/ML IJ SOLN
5000.0000 [IU] | Freq: Three times a day (TID) | INTRAMUSCULAR | Status: DC
  Administered 2014-02-16 – 2014-02-18 (×7): 5000 [IU] via SUBCUTANEOUS
  Filled 2014-02-16 (×9): qty 1

## 2014-02-16 MED ORDER — HYDROMORPHONE HCL 1 MG/ML IJ SOLN
1.0000 mg | Freq: Once | INTRAMUSCULAR | Status: AC
  Administered 2014-02-16: 1 mg via INTRAMUSCULAR
  Filled 2014-02-16: qty 1

## 2014-02-16 MED ORDER — ONDANSETRON 8 MG PO TBDP
8.0000 mg | ORAL_TABLET | Freq: Three times a day (TID) | ORAL | Status: DC | PRN
  Filled 2014-02-16: qty 1

## 2014-02-16 MED ORDER — SODIUM CHLORIDE 0.9 % IV SOLN
INTRAVENOUS | Status: DC
  Administered 2014-02-16 – 2014-02-17 (×3): via INTRAVENOUS

## 2014-02-16 MED ORDER — SODIUM CHLORIDE 0.9 % IV BOLUS (SEPSIS): 1000.0000 mL | Freq: Once | INTRAVENOUS | Status: DC

## 2014-02-16 MED ORDER — SODIUM CHLORIDE 0.9 % IV BOLUS (SEPSIS)
1000.0000 mL | Freq: Once | INTRAVENOUS | Status: AC
  Administered 2014-02-16: 1000 mL via INTRAVENOUS

## 2014-02-16 MED ORDER — SODIUM CHLORIDE 0.9 % IV SOLN
INTRAVENOUS | Status: DC
Start: 2014-02-16 — End: 2014-02-16

## 2014-02-16 MED ORDER — GLUCOSE 40 % PO GEL: 1.0000 | Freq: Once | ORAL | Status: AC | PRN

## 2014-02-16 MED ORDER — PROMETHAZINE HCL 25 MG/ML IJ SOLN: 12.5000 mg | Freq: Once | INTRAMUSCULAR | Status: DC

## 2014-02-16 MED ORDER — PROMETHAZINE HCL 25 MG/ML IJ SOLN
25.0000 mg | Freq: Once | INTRAMUSCULAR | Status: AC
  Administered 2014-02-16: 25 mg via INTRAMUSCULAR

## 2014-02-16 MED ORDER — SUCRALFATE 1 G PO TABS
1.0000 g | ORAL_TABLET | Freq: Three times a day (TID) | ORAL | Status: DC
  Administered 2014-02-16 – 2014-02-18 (×9): 1 g via ORAL
  Filled 2014-02-16 (×13): qty 1

## 2014-02-16 MED ORDER — LORAZEPAM 2 MG/ML IJ SOLN
1.0000 mg | Freq: Once | INTRAMUSCULAR | Status: AC
  Administered 2014-02-16: 1 mg via INTRAVENOUS
  Filled 2014-02-16: qty 1

## 2014-02-16 MED ORDER — AMLODIPINE BESYLATE 10 MG PO TABS
10.0000 mg | ORAL_TABLET | Freq: Every morning | ORAL | Status: DC
  Administered 2014-02-16 – 2014-02-18 (×2): 10 mg via ORAL
  Filled 2014-02-16 (×3): qty 1

## 2014-02-16 MED ORDER — CLONIDINE HCL 0.1 MG PO TABS
0.1000 mg | ORAL_TABLET | Freq: Two times a day (BID) | ORAL | Status: DC
  Administered 2014-02-16 – 2014-02-18 (×4): 0.1 mg via ORAL
  Filled 2014-02-16 (×8): qty 1

## 2014-02-16 MED ORDER — ACETAMINOPHEN 500 MG PO TABS: 500.0000 mg | ORAL_TABLET | Freq: Four times a day (QID) | ORAL | Status: DC | PRN

## 2014-02-16 MED ORDER — HYDROMORPHONE HCL 1 MG/ML IJ SOLN
1.0000 mg | INTRAMUSCULAR | Status: DC | PRN
Start: 2014-02-16 — End: 2014-02-16
  Administered 2014-02-16: 1 mg via INTRAMUSCULAR
  Filled 2014-02-16: qty 1

## 2014-02-16 MED ORDER — PROMETHAZINE HCL 25 MG/ML IJ SOLN: 12.5000 mg | Freq: Four times a day (QID) | INTRAMUSCULAR | Status: DC | PRN

## 2014-02-16 MED ORDER — HYDROCODONE-ACETAMINOPHEN 5-325 MG PO TABS
1.0000 | ORAL_TABLET | Freq: Four times a day (QID) | ORAL | Status: DC | PRN
  Administered 2014-02-16: 1 via ORAL
  Administered 2014-02-17: 2 via ORAL
  Filled 2014-02-16: qty 1
  Filled 2014-02-16: qty 2

## 2014-02-16 MED ORDER — DIPHENHYDRAMINE HCL 50 MG/ML IJ SOLN
25.0000 mg | Freq: Once | INTRAMUSCULAR | Status: AC
  Administered 2014-02-16: 25 mg via INTRAVENOUS
  Filled 2014-02-16: qty 1

## 2014-02-16 MED ORDER — METOCLOPRAMIDE HCL 5 MG/ML IJ SOLN
10.0000 mg | Freq: Once | INTRAMUSCULAR | Status: AC
  Administered 2014-02-16: 10 mg via INTRAVENOUS
  Filled 2014-02-16: qty 2

## 2014-02-16 MED ORDER — HYDROMORPHONE HCL 1 MG/ML IJ SOLN
1.0000 mg | INTRAMUSCULAR | Status: DC | PRN
  Administered 2014-02-16 – 2014-02-18 (×4): 1 mg via INTRAVENOUS
  Filled 2014-02-16 (×4): qty 1

## 2014-02-16 MED ORDER — PROMETHAZINE HCL 25 MG/ML IJ SOLN
12.5000 mg | Freq: Four times a day (QID) | INTRAMUSCULAR | Status: DC | PRN
  Filled 2014-02-16: qty 1

## 2014-02-16 MED ORDER — ONDANSETRON 4 MG PO TBDP
8.0000 mg | ORAL_TABLET | Freq: Once | ORAL | Status: AC
  Administered 2014-02-16: 8 mg via ORAL
  Filled 2014-02-16: qty 2

## 2014-02-16 NOTE — Progress Notes (Signed)
Spoke with patient about diabetes and home regimen for diabetes control. Patient reports that he has recently lost his job and his insurance and he can not afford to follow up with his endocrinologist (Dr. Dwyane Dee) or his PCP. Currently he takes Lantus 28 units QHS and Novolog correction based on glucose TID as an outpatient for diabetes control.  Patient last saw Dr. Dwyane Dee on 10/08/13 and was prescribed Lantus 36 units QAM, Novolog 3 units for every 15 grams of carbs, and Novolog 1 units for every 30 mg/dl above target glucose (per office note on 10/08/13). Noted in reviewing the chart that the patient was discharged from the hospital on 01/10/14 at which time Lantus was discontinued and patient was prescribed 70/30 8 units BID (instructed to increase to 10 units if CBG Greater than 250 mg/dl). However in talking with the patient he states that he never started on the 70/30 because he continued to use the Lantus and Novolog he had left at home. Patient reports that his glucose has been trending in the mid to high 100's mg/dl for the most part.  Inquired about knowledge about A1C and patient reports that he does not recall his last A1C value. Patient states that he will need assistance with follow up and with medications at time of discharge. Informed patient about [facility] and will place consult for Case Management. During conversation patient kept his eyes closed and answered questions asked but had nothing more to contribute to the conversation. Patient verbalized understanding of information discussed and he states that he has no further questions at this time related to diabetes.   Since the patient does not have any insurance it would be more affordable to use 70/30 insulin. Patient can purchase Novolin 70/30 at [facility] for $25 per vial (whereas Lantus is over $250 per vial). According to chart, patient has Type 1 diabetes and therefore will require basal and bolus  insulin. Patient stated that he is still not eating (as evidenced by untouched meal tray at bedside) and still feels nauseated. Therefore, would not recommend switching basal insulin from NPH to 70/30 until patient is eating and tolerating diet.  Will continue to follow and make recommendations as more data is collected.  Thanks, Barnie Alderman, RN, MSN, CCRN Diabetes Coordinator Inpatient Diabetes Program [phone] (Team Pager) [phone] (AP office) [phone] [facility] office)

## 2014-02-16 NOTE — ED Provider Notes (Signed)
CSN: [id number]     Arrival date & time 02/16/14  0158 History   First MD Initiated Contact with Patient 02/16/14 0304     Chief Complaint  Patient presents with  . Emesis  . Nausea     (Consider location/radiation/quality/duration/timing/severity/associated sxs/prior Treatment) Patient is a 39 y.o. male presenting with vomiting. The history is provided by the patient. No language interpreter was used.  Emesis Severity:  Severe Associated symptoms: abdominal pain   Associated symptoms: no chills   Associated symptoms comment:  Nausea and vomiting since yesterday with associated abdominal pain. He has a history of gastroparesis, last admitted 02/03/14 with acute kidney injury (Cr. 1.84 with baseline 1.07). No fever. No hematemesis.   Past Medical History  Diagnosis Date  . Hypertension   . Diabetes mellitus without complication age 50    insulin requiring from the start  . Gastritis   . Gastroparesis 2012    100% gastric retention on 08/2013 GES  . Obesity   . Diabetic neuropathy    Past Surgical History  Procedure Laterality Date  . Cholecystectomy  2013  . Knee surgery Left   . Flexible sigmoidoscopy N/A 06/11/2013    Procedure: FLEXIBLE SIGMOIDOSCOPY;  Surgeon: [name], MD;  Location: [facility] ENDOSCOPY;  Service: Endoscopy;  Laterality: N/A;  . Esophagogastroduodenoscopy N/A 06/11/2013    Procedure: ESOPHAGOGASTRODUODENOSCOPY (EGD);  Surgeon: [name], MD;  Location: [facility];  Service: Endoscopy;  Laterality: N/A;   Family History  Problem Relation Age of Onset  . Diabetes Father    History  Substance Use Topics  . Smoking status: Never Smoker   . Smokeless tobacco: Never Used  . Alcohol Use: Yes     Comment: "none really" - 08/27/13    Review of Systems  Constitutional: Negative for fever and chills.  HENT: Negative.   Respiratory: Negative.   Cardiovascular: Negative.   Gastrointestinal: Positive for nausea, vomiting and abdominal pain.   Musculoskeletal: Negative.   Skin: Negative.   Neurological: Negative.       Allergies  Review of patient's allergies indicates no known allergies.  Home Medications   Prior to Admission medications   Medication Sig Start Date End Date Taking? Authorizing Provider  acetaminophen (TYLENOL) 500 MG tablet Take 500 mg by mouth every 6 (six) hours as needed for mild pain.     Historical Provider, MD  amLODipine (NORVASC) 10 MG tablet Take 1 tablet (10 mg total) by mouth every morning. 02/05/14   [name], MD  cloNIDine (CATAPRES) 0.1 MG tablet Take 1 tablet (0.1 mg total) by mouth 2 (two) times daily. 02/05/14   [name], MD  gabapentin (NEURONTIN) 300 MG capsule Take 1 capsule (300 mg total) by mouth at bedtime. 02/05/14   [name], MD  glucose blood (ONE TOUCH ULTRA TEST) test strip Use as instructed to check blood sugar once a day dx code 250.00 02/05/14   [name], MD  Insulin Aspart Prot & Aspart (NOVOLOG 70/30 MIX) (70-30) 100 UNIT/ML Pen Inject 8 Units into the skin 2 (two) times daily. 02/05/14   [name], MD  metoCLOPramide (REGLAN) 10 MG tablet Take 1 tablet (10 mg total) by mouth 4 (four) times daily. 02/05/14   [name], MD  metoprolol (LOPRESSOR) 50 MG tablet Take 1 tablet (50 mg total) by mouth 2 (two) times daily. 02/05/14   [name], MD  pantoprazole (PROTONIX) 40 MG tablet Take 1 tablet (40 mg total) by mouth 2 (two) times daily. 01/10/14  [name], MD  promethazine (PHENERGAN) 25 MG tablet Take 1 tablet (25 mg total) by mouth every 8 (eight) hours as needed for nausea or vomiting. 01/10/14   [name], MD  sucralfate (CARAFATE) 1 G tablet Take 1 tablet (1 g total) by mouth 4 (four) times daily -  with meals and at bedtime. 02/05/14   [name], MD   BP 169/94 mmHg  Pulse 119  Temp(Src) 98 F (36.7 C)  Resp 26  Wt 280 lb (127.007 kg)  SpO2 98% Physical Exam  Constitutional: He is oriented to  person, place, and time. He appears well-developed and well-nourished.  HENT:  Head: Normocephalic.  Mouth/Throat: Mucous membranes are dry.  Neck: Normal range of motion. Neck supple.  Cardiovascular: Normal rate and regular rhythm.   Pulmonary/Chest: Effort normal and breath sounds normal.  Abdominal: Soft. There is tenderness. There is no rebound and no guarding.  Generalized abdominal tenderness, upper quadrants greater than lower.   Musculoskeletal: Normal range of motion.  Neurological: He is alert and oriented to person, place, and time.  Skin: Skin is warm and dry. No rash noted.  Psychiatric: He has a normal mood and affect.    ED Course  Procedures (including critical care time) Labs Review Labs Reviewed  CBC WITH DIFFERENTIAL - Abnormal; Notable for the following:    Neutrophils Relative % 88 (*)    Neutro Abs 8.6 (*)    Lymphocytes Relative 10 (*)    Monocytes Relative 2 (*)    All other components within normal limits  BASIC METABOLIC PANEL - Abnormal; Notable for the following:    Glucose, Bld 365 (*)    Creatinine, Ser 2.07 (*)    GFR calc non Af Amer 39 (*)    GFR calc Af Amer 45 (*)    All other components within normal limits    Imaging Review No results found.   EKG Interpretation None      MDM   Final diagnoses:  None    1. Gastroparesis 2. Intractable vomiting 3. Acute kidney injury  Creatinine is again elevated at 2.07. He is tachycardic with intractable vomiting and history of same. Will admit.     Dewaine Oats, PA-C 02/16/14 [place], MD 02/16/14 [phone]

## 2014-02-16 NOTE — H&P (Signed)
[facility] History and Physical  Dion Latendresse [id number] DOB: 03-27-1975 DOA: 02/16/2014  Referring physician: EDP PCP: [name], MD   Chief Complaint: N/V   HPI: Duane [name] is a 39 y.o. male well known to our service who returns to the ED with recurrence of diabetic gastroparesis causing intractable nausea and vomiting.  Symptoms are so severe he cannot keep his PO meds down.  There is associated epigastric abdominal pain.  Review of Systems: Systems reviewed.  As above, otherwise negative  Past Medical History  Diagnosis Date  . Hypertension   . Diabetes mellitus without complication age 67    insulin requiring from the start  . Gastritis   . Gastroparesis 2012    100% gastric retention on 08/2013 GES  . Obesity   . Diabetic neuropathy    Past Surgical History  Procedure Laterality Date  . Cholecystectomy  2013  . Knee surgery Left   . Flexible sigmoidoscopy N/A 06/11/2013    Procedure: FLEXIBLE SIGMOIDOSCOPY;  Surgeon: [name], MD;  Location: [facility];  Service: Endoscopy;  Laterality: N/A;  . Esophagogastroduodenoscopy N/A 06/11/2013    Procedure: ESOPHAGOGASTRODUODENOSCOPY (EGD);  Surgeon: [name], MD;  Location: [facility];  Service: Endoscopy;  Laterality: N/A;   Social History:  reports that he has never smoked. He has never used smokeless tobacco. He reports that he drinks alcohol. He reports that he does not use illicit drugs.  No Known Allergies  Family History  Problem Relation Age of Onset  . Diabetes Father      Prior to Admission medications   Medication Sig Start Date End Date Taking? Authorizing Provider  acetaminophen (TYLENOL) 500 MG tablet Take 500 mg by mouth every 6 (six) hours as needed for mild pain.    Yes Historical Provider, MD  amLODipine (NORVASC) 10 MG tablet Take 1 tablet (10 mg total) by mouth every morning. 02/05/14  Yes [name], MD  cloNIDine (CATAPRES) 0.1 MG tablet Take 1 tablet (0.1 mg  total) by mouth 2 (two) times daily. 02/05/14  Yes [name], MD  gabapentin (NEURONTIN) 300 MG capsule Take 1 capsule (300 mg total) by mouth at bedtime. 02/05/14  Yes [name], MD  Insulin Aspart Prot & Aspart (NOVOLOG 70/30 MIX) (70-30) 100 UNIT/ML Pen Inject 8 Units into the skin 2 (two) times daily. 02/05/14  Yes [name], MD  metoCLOPramide (REGLAN) 10 MG tablet Take 1 tablet (10 mg total) by mouth 4 (four) times daily. 02/05/14  Yes [name], MD  metoprolol (LOPRESSOR) 50 MG tablet Take 1 tablet (50 mg total) by mouth 2 (two) times daily. 02/05/14  Yes [name], MD  pantoprazole (PROTONIX) 40 MG tablet Take 1 tablet (40 mg total) by mouth 2 (two) times daily. 01/10/14  Yes [name], MD  promethazine (PHENERGAN) 25 MG tablet Take 1 tablet (25 mg total) by mouth every 8 (eight) hours as needed for nausea or vomiting. 01/10/14  Yes [name], MD  sucralfate (CARAFATE) 1 G tablet Take 1 tablet (1 g total) by mouth 4 (four) times daily -  with meals and at bedtime. 02/05/14  Yes [name], MD  glucose blood (ONE TOUCH ULTRA TEST) test strip Use as instructed to check blood sugar once a day dx code 250.00 02/05/14   [name], MD   Physical Exam: Filed Vitals:   02/16/14 0447  BP: 117/62  Pulse: 105  Temp:   Resp: 12    BP 117/62 mmHg  Pulse 105  Temp(Src) 98 F (36.7 C)  Resp 12  Wt 127.007 kg (280 lb)  SpO2 97%  General Appearance:    Alert, oriented, no distress, appears stated age  Head:    Normocephalic, atraumatic  Eyes:    PERRL, EOMI, sclera non-icteric        Nose:   Nares without drainage or epistaxis. Mucosa, turbinates normal  Throat:   Dry mucous membranes. Oropharynx without erythema or exudate.  Neck:   Supple. No carotid bruits.  No thyromegaly.  No lymphadenopathy.   Back:     No CVA tenderness, no spinal tenderness  Lungs:     Clear to auscultation bilaterally, without wheezes, rhonchi or rales   Chest wall:    No tenderness to palpitation  Heart:    Regular rate and rhythm without murmurs, gallops, rubs  Abdomen:     Soft, epigastric tenderness, nondistended, normal bowel sounds, no organomegaly  Genitalia:    deferred  Rectal:    deferred  Extremities:   No clubbing, cyanosis or edema.  Pulses:   2+ and symmetric all extremities  Skin:   Skin color, texture, turgor normal, no rashes or lesions  Lymph nodes:   Cervical, supraclavicular, and axillary nodes normal  Neurologic:   CNII-XII intact. Normal strength, sensation and reflexes      throughout    Labs on Admission:  Basic Metabolic Panel:  Recent Labs Lab 02/16/14 0235  NA 138  K 4.7  CL 100  CO2 23  GLUCOSE 365*  BUN 16  CREATININE 2.07*  CALCIUM 9.6   Liver Function Tests: No results for input(s): AST, ALT, ALKPHOS, BILITOT, PROT, ALBUMIN in the last 168 hours. No results for input(s): LIPASE, AMYLASE in the last 168 hours. No results for input(s): AMMONIA in the last 168 hours. CBC:  Recent Labs Lab 02/16/14 0235  WBC 9.8  NEUTROABS 8.6*  HGB 13.4  HCT 40.3  MCV 80.6  PLT 328   Cardiac Enzymes: No results for input(s): CKTOTAL, CKMB, CKMBINDEX, TROPONINI in the last 168 hours.  BNP (last 3 results) No results for input(s): PROBNP in the last 8760 hours. CBG: No results for input(s): GLUCAP in the last 168 hours.  Radiological Exams on Admission: No results found.  EKG: Independently reviewed.  Assessment/Plan Active Problems:   IDDM (insulin dependent diabetes mellitus)   Diabetic gastroparesis   Acute kidney injury   1. Diabetic gastroparesis - 1. Reglan 10mg  Q6H IV 2. Phenergan PRN 3. NPO 2. AKI - due to dehydration from #1 1. IVF 2. Intake and output 3. Follow renal function 3. IDDM - normally takes 8 units 70/30 BID 1. Hold home 70/30 while NPO 2. Use 4 units NPH BID 3. Use sensitive scale SSI Q4H while NPO.    Code Status: Full  Family Communication: No family in  room Disposition Plan: Admit to inpatient   Time spent: 28 min  GARDNER, JARED M. [facility] Pager [phone]  If 7AM-7PM, please contact the day team taking care of the patient [email or web address] Password [email or web address] 02/16/2014, 5:29 AM

## 2014-02-16 NOTE — ED Notes (Signed)
Patient actively vomiting.  History of the same

## 2014-02-16 NOTE — Progress Notes (Addendum)
Pt seen and examined at bedside. Clinically stable this AM, reports persistent nausea. Pt has been admitted after midnight so please see earlier admission H&P by Dr. Alcario Drought. In summary:  Pt is 39 yo male with known diabetic gastroparesis, frequent visits to the ED for the gastroparesis flares. Last known A1C 02/03/2014 was 8.3.   A/P: 1) Diabetic gastroparesis - continue supportive care with zofran and phenergan - continue Reglan Iv and transition to oral Reglan once pt able to tolerate PO - NPO for now and advance diet when pt feels ready  2) DM, type I - uncontrolled with complications of neuropathy and gastroparesis  - continue Insulin NPH, also SSI - provide neurontin for neuropathies  3) ARF - likely pre renal from poor oral intake in the setting of gastroparesis - continue IVF and repeat BMP in AM 4) HTN - continue home medical regimen  5) DVT prophylaxis: - Heparin SQ  Faye Ramsay, MD  [facility] Pager [phone]  If 7PM-7AM, please contact night-coverage [email or web address] Password [email or web address]

## 2014-02-16 NOTE — Care Management Note (Signed)
Noted referral to CM for assist with meds and followup appointments. Please see note below re all services arranged for this pt at time of last d/c on 02/05/2014 This pt has been assisted with the [facility] program for Med once this year and had an override in addition, therefor we are unable to assist any further this year. Noted that arrangements for pt to obtain CBG meter and to apply for [facility] Card was also made. Will followup to see if we can schedule a followup appointment with [place]. Again , we are not able to assist any more with meds this year. Will talk with pt about followuping up post d/c.     02/05/14 14:12 Wendi Maya RN BSN NCM [phone] ED CM received consult for medication assistance. Patient is being discharged from 6N today. Reviewed patient record, and patient was discharged on 01/13/2014 and received MATCH medication assistance at that time, spoke with Laverta Baltimore from [facility] inpatient pharmacy concerning this issue, she will extend time on [facility] medication assistance program.  Spoke with patient, he reports currently being uninsured, and receiving medications assistance earlier in the month explained we will provide him with a aletter of medication assistance to take to any of the participating pharmacies along with $3.00 per prescription patient verbalized understanding and appreciation, and is agreeable with disposition plan . Reiterated that medication assistance is only once within a calendar year.  Discussed the [facility] and services that are provided. Patient interested in f/u at the [facility]. Patient instructed to walk in on Monday 12/28 to schedule follow appt and to take prescription for True Results glucometer to [facility] pharmacy to have fill.  Patient verbalized understanding teach back done. Updated with Coralyn Mark RN on disposition plan, will send [place] letter to [facility] email to print and give to patient.  Financial counseling appt also scheduled for [employer] and [employer] for 1/22 at 1600 patient made aware.  No further CM needs identified.

## 2014-02-16 NOTE — Progress Notes (Signed)
Patient arrived to [phone] AAOx4 and able to ambulate to the bathroom and bed. IV fluids running and questions answered. Patient requesting pain medication but sleeping at this moment. Will continue to monitor. Simona Rocque, Rande Brunt

## 2014-02-16 NOTE — ED Notes (Signed)
Vomiting at this time.  Medicated per order for pain and nausea.  Family member remains at the bedside.  Encouraged to call for assistance as needed.

## 2014-02-16 NOTE — ED Notes (Signed)
Attempted to call report.  States we didn't know we were getting a patient and will call back.

## 2014-02-17 DIAGNOSIS — R1110 Vomiting, unspecified: Secondary | ICD-10-CM

## 2014-02-17 DIAGNOSIS — R112 Nausea with vomiting, unspecified: Secondary | ICD-10-CM | POA: Insufficient documentation

## 2014-02-17 LAB — BASIC METABOLIC PANEL
Anion gap: 7 (ref 5–15)
BUN: 15 mg/dL (ref 6–23)
CHLORIDE: 106 meq/L (ref 96–112)
CO2: 27 mmol/L (ref 19–32)
Calcium: 9 mg/dL (ref 8.4–10.5)
Creatinine, Ser: 1.34 mg/dL (ref 0.50–1.35)
GFR calc Af Amer: 77 mL/min — ABNORMAL LOW (ref >90)
GFR calc non Af Amer: 66 mL/min — ABNORMAL LOW (ref >90)
Glucose, Bld: 171 mg/dL — ABNORMAL HIGH (ref 70–99)
Potassium: 4.3 mmol/L (ref 3.5–5.1)
Sodium: 140 mmol/L (ref 135–145)

## 2014-02-17 LAB — CBC
HCT: 37.3 % — ABNORMAL LOW (ref 39.0–52.0)
Hemoglobin: 11.9 g/dL — ABNORMAL LOW (ref 13.0–17.0)
MCH: 26.6 pg (ref 26.0–34.0)
MCHC: 31.9 g/dL (ref 30.0–36.0)
MCV: 83.4 fL (ref 78.0–100.0)
Platelets: 256 10*3/uL (ref 150–400)
RBC: 4.47 MIL/uL (ref 4.22–5.81)
RDW: 13.1 % (ref 11.5–15.5)
WBC: 6.2 10*3/uL (ref 4.0–10.5)

## 2014-02-17 LAB — GLUCOSE, CAPILLARY
GLUCOSE-CAPILLARY: 174 mg/dL — AB (ref 70–99)
Glucose-Capillary: 124 mg/dL — ABNORMAL HIGH (ref 70–99)
Glucose-Capillary: 147 mg/dL — ABNORMAL HIGH (ref 70–99)
Glucose-Capillary: 148 mg/dL — ABNORMAL HIGH (ref 70–99)
Glucose-Capillary: 161 mg/dL — ABNORMAL HIGH (ref 70–99)
Glucose-Capillary: 171 mg/dL — ABNORMAL HIGH (ref 70–99)

## 2014-02-17 NOTE — Progress Notes (Signed)
Physician notified: Elmahi At: [id number]  Regarding: BP 102/60, HR 63, has clonidine, Norvasc, metoprolol due; give all?  Awaiting return response.   Returned Response at: 1146  Order(s): Hold norvasc and clonidine. Give metoprolol.

## 2014-02-17 NOTE — Progress Notes (Signed)
[facility] PROGRESS NOTE   Sharif Wolverton [id number] DOB: 1976/01/09 DOA: 02/16/2014 PCP: [name], MD  HPI/Subjective: Feels much better, on clear liquids reported no nausea or vomiting.  Assessment/Plan: Active Problems:   IDDM (insulin dependent diabetes mellitus)   Diabetic gastroparesis   Acute kidney injury    Nausea and vomiting Likely secondary to acute diabetic gastroparesis flareup. Continue bowel rest, with clear liquids, supportive care with antiemetics. Continue PPI, IV fluids. No changes in KUB.  Acute renal failure Secondary to dehydration from nausea and vomiting, continue IV fluids. Presented with creatinine of 2.0, creatinine is already improving to 1.3 this morning.  Essential hypertension Continue home regimen.  Diabetes mellitus type 2 Patient is on a sliding scale, reported noncompliance at home secondary to financial issues. Patient [facility].  Diabetic neuropathy Continue Neurontin  Code Status: Full code Family Communication: Plan discussed with the patient. Disposition Plan: Remains inpatient   Consultants:  None  Procedures:  None  Antibiotics:  None   Objective: Filed Vitals:   02/17/14 0940  BP: 104/60  Pulse: 63  Temp: 99.1 F (37.3 C)  Resp: 20    Intake/Output Summary (Last 24 hours) at 02/17/14 1729 Last data filed at 02/17/14 1400  Gross per 24 hour  Intake   1905 ml  Output    400 ml  Net   1505 ml   Filed Weights   02/16/14 0217 02/16/14 0613 02/16/14 2011  Weight: 127.007 kg (280 lb) 118 kg (260 lb 2.3 oz) 118.752 kg (261 lb 12.8 oz)    Exam: General: Alert and awake, oriented x3, not in any acute distress. HEENT: anicteric sclera, pupils reactive to light and accommodation, EOMI CVS: S1-S2 clear, no murmur rubs or gallops Chest: clear to auscultation bilaterally, no wheezing, rales or rhonchi Abdomen: soft nontender, nondistended, normal bowel  sounds, no organomegaly Extremities: no cyanosis, clubbing or edema noted bilaterally Neuro: Cranial nerves II-XII intact, no focal neurological deficits  Data Reviewed: Basic Metabolic Panel:  Recent Labs Lab 02/16/14 0235 02/17/14 0555  NA 138 140  K 4.7 4.3  CL 100 106  CO2 23 27  GLUCOSE 365* 171*  BUN 16 15  CREATININE 2.07* 1.34  CALCIUM 9.6 9.0   Liver Function Tests: No results for input(s): AST, ALT, ALKPHOS, BILITOT, PROT, ALBUMIN in the last 168 hours. No results for input(s): LIPASE, AMYLASE in the last 168 hours. No results for input(s): AMMONIA in the last 168 hours. CBC:  Recent Labs Lab 02/16/14 0235 02/17/14 0555  WBC 9.8 6.2  NEUTROABS 8.6*  --   HGB 13.4 11.9*  HCT 40.3 37.3*  MCV 80.6 83.4  PLT 328 256   Cardiac Enzymes: No results for input(s): CKTOTAL, CKMB, CKMBINDEX, TROPONINI in the last 168 hours. BNP (last 3 results) No results for input(s): PROBNP in the last 8760 hours. CBG:  Recent Labs Lab 02/17/14 0005 02/17/14 0406 02/17/14 0750 02/17/14 1230 02/17/14 1644  GLUCAP 124* 147* 161* 171* 174*    Micro No results found for this or any previous visit (from the past 240 hour(s)).   Studies: No results found.  Scheduled Meds: . amLODipine  10 mg Oral q morning - 10a  . cloNIDine  0.1 mg Oral BID  . gabapentin  300 mg Oral QHS  . heparin  5,000 Units Subcutaneous 3 times per day  . insulin aspart  0-9 Units Subcutaneous 6 times per day  . insulin NPH Human  4  Units Subcutaneous BID AC & HS  . metoCLOPramide (REGLAN) injection  10 mg Intravenous 4 times per day  . metoprolol  50 mg Oral BID  . pantoprazole  40 mg Oral BID  . sodium chloride  1,000 mL Intravenous Once  . sucralfate  1 g Oral TID WC & HS   Continuous Infusions: . sodium chloride 75 mL/hr at 02/17/14 1340       Time spent: 35 minutes    [facility] A  [facility] Pager [phone] If 7PM-7AM, please contact night-coverage at [email or web address],  password [facility] 02/17/2014, 5:29 PM  LOS: 1 day

## 2014-02-18 LAB — BASIC METABOLIC PANEL
ANION GAP: 8 (ref 5–15)
BUN: 13 mg/dL (ref 6–23)
CALCIUM: 8.7 mg/dL (ref 8.4–10.5)
CO2: 27 mmol/L (ref 19–32)
Chloride: 104 mEq/L (ref 96–112)
Creatinine, Ser: 1.27 mg/dL (ref 0.50–1.35)
GFR calc non Af Amer: 71 mL/min — ABNORMAL LOW (ref >90)
GFR, EST AFRICAN AMERICAN: 82 mL/min — AB (ref >90)
Glucose, Bld: 187 mg/dL — ABNORMAL HIGH (ref 70–99)
Potassium: 3.6 mmol/L (ref 3.5–5.1)
Sodium: 139 mmol/L (ref 135–145)

## 2014-02-18 LAB — GLUCOSE, CAPILLARY
Glucose-Capillary: 157 mg/dL — ABNORMAL HIGH (ref 70–99)
Glucose-Capillary: 163 mg/dL — ABNORMAL HIGH (ref 70–99)

## 2014-02-18 LAB — CBC
HCT: 37.2 % — ABNORMAL LOW (ref 39.0–52.0)
Hemoglobin: 12.2 g/dL — ABNORMAL LOW (ref 13.0–17.0)
MCH: 27.4 pg (ref 26.0–34.0)
MCHC: 32.8 g/dL (ref 30.0–36.0)
MCV: 83.4 fL (ref 78.0–100.0)
PLATELETS: 277 10*3/uL (ref 150–400)
RBC: 4.46 MIL/uL (ref 4.22–5.81)
RDW: 12.8 % (ref 11.5–15.5)
WBC: 4.5 10*3/uL (ref 4.0–10.5)

## 2014-02-18 NOTE — Care Management Note (Signed)
CARE MANAGEMENT NOTE 02/18/2014  Patient:  Duane [name], Duane [name]   Account Number:  [id number]  Date Initiated:  02/17/2014  Documentation initiated by:  Jasmine Pang  Subjective/Objective Assessment:   CM following for progression and d/c planning     Action/Plan:   02/18/2014 Met with pt to discuss d/c needs. Pt is not eligible for further assistance with meds this year.However has an appointment to apply for an [employer] , he has his prescription for CBG meter to get at [facility]   Anticipated DC Date:     Anticipated DC Plan:  HOME/SELF CARE         Choice offered to / List presented to:             Status of service:  In process, will continue to follow Medicare Important Message given?   (If response is "NO", the following Medicare IM given date fields will be blank) Date Medicare IM given:   Medicare IM given by:   Date Additional Medicare IM given:   Additional Medicare IM given by:    Discharge Disposition:    Per UR Regulation:    If discussed at Long Length of Stay Meetings, dates discussed:    Comments:

## 2014-02-18 NOTE — Discharge Instructions (Signed)

## 2014-02-18 NOTE — Discharge Summary (Signed)
Physician Discharge Summary  Duane [name] [id number] DOB: 28-Feb-1975 DOA: 02/16/2014  PCP: [name], MD  Admit date: 02/16/2014 Discharge date: 02/18/2014  Time spent: Less than 30 minutes  Recommendations for Outpatient Follow-up:  1. Dr. [name], PCP in 4 days with repeat labs (CBC & BMP). 2. Patient was provided resources by case management to arrange follow-up at [place] in case he was unable to follow-up with his stated PCP for any reason.  Discharge Diagnoses:  Active Problems:   IDDM (insulin dependent diabetes mellitus)   Diabetic gastroparesis   Acute kidney injury   Nausea with vomiting   Discharge Condition: Improved & Stable  Diet recommendation: Heart healthy and diabetic diet.  Filed Weights   02/16/14 [id number] 02/16/14 2011 02/17/14 2015  Weight: 118 kg (260 lb 2.3 oz) 118.752 kg (261 lb 12.8 oz) 118.797 kg (261 lb 14.4 oz)    History of present illness & Hospital course:  39 year old male patient with history of diabetes, diabetic gastroparesis with prior admissions for same, HTN, presented with another episode of intractable nausea and vomiting consistent with prior episodes of diabetic gastroparesis. He was hospitalized and treated supportively with bowel rest, IV fluids, antiemetics, Reglan. His diet was then gradually advanced. He has tolerated regular consistency diet for breakfast and lunch today without symptoms of nausea, vomiting or abdominal pain. He is passing flatus. No BMs yet. He is stable for discharge home. Acute kidney injury: Creatinine on admission was 2.07. Likely precipitated by intravascular volume depletion. Creatinine significantly improved after IV fluid hydration. Advised adequate oral hydration. Follow-up BMP as outpatient in the next couple of days. IDDM: Patient will resume home dose of 70/30 insulin. In the hospital he was placed on NovoLog SSI with reasonable inpatient control. Essential hypertension:  Controlled.   Consultations:  None  Procedures:  None    Discharge Exam:  Complaints:  Denies complaints. Has tolerated regular consistency diet. Passing flatus. No abdominal pain, nausea or vomiting.  Filed Vitals:   02/17/14 1824 02/17/14 2015 02/18/14 0420 02/18/14 1000  BP: 112/65 115/69 121/70 119/64  Pulse: 68 73 70 69  Temp: 98.2 F (36.8 C) 99.3 F (37.4 C) 98.5 F (36.9 C) 97.9 F (36.6 C)  TempSrc: Oral Oral Oral Oral  Resp: 18 18 17 20   Height:  6\' 3"  (1.905 m)    Weight:  118.797 kg (261 lb 14.4 oz)    SpO2: 96% 99% 100% 99%    General exam: Pleasant young male sitting up in bed comfortably. Respiratory system: Clear. No increased work of breathing. Cardiovascular system: S1 & S2 heard, RRR. No JVD, murmurs, gallops, clicks or pedal edema. Gastrointestinal system: Abdomen is nondistended, soft and nontender. Normal bowel sounds heard. Central nervous system: Alert and oriented. No focal neurological deficits. Extremities: Symmetric 5 x 5 power.  Discharge Instructions      Discharge Instructions    Call MD for:  persistant nausea and vomiting    Complete by:  As directed      Call MD for:  severe uncontrolled pain    Complete by:  As directed      Diet - low sodium heart healthy    Complete by:  As directed      Diet Carb Modified    Complete by:  As directed      Increase activity slowly    Complete by:  As directed             Medication List  TAKE these medications        acetaminophen 500 MG tablet  Commonly known as:  TYLENOL  Take 500 mg by mouth every 6 (six) hours as needed for mild pain.     amLODipine 10 MG tablet  Commonly known as:  NORVASC  Take 1 tablet (10 mg total) by mouth every morning.     cloNIDine 0.1 MG tablet  Commonly known as:  CATAPRES  Take 1 tablet (0.1 mg total) by mouth 2 (two) times daily.     gabapentin 300 MG capsule  Commonly known as:  NEURONTIN  Take 1 capsule (300 mg total) by mouth at  bedtime.     glucose blood test strip  Commonly known as:  ONE TOUCH ULTRA TEST  Use as instructed to check blood sugar once a day dx code 250.00     insulin aspart protamine - aspart (70-30) 100 UNIT/ML FlexPen  Commonly known as:  NOVOLOG 70/30 MIX  Inject 8 Units into the skin 2 (two) times daily.     metoCLOPramide 10 MG tablet  Commonly known as:  REGLAN  Take 1 tablet (10 mg total) by mouth 4 (four) times daily.     metoprolol 50 MG tablet  Commonly known as:  LOPRESSOR  Take 1 tablet (50 mg total) by mouth 2 (two) times daily.     pantoprazole 40 MG tablet  Commonly known as:  PROTONIX  Take 1 tablet (40 mg total) by mouth 2 (two) times daily.     promethazine 25 MG tablet  Commonly known as:  PHENERGAN  Take 1 tablet (25 mg total) by mouth every 8 (eight) hours as needed for nausea or vomiting.     sucralfate 1 G tablet  Commonly known as:  CARAFATE  Take 1 tablet (1 g total) by mouth 4 (four) times daily -  with meals and at bedtime.       Follow-up Information    Please follow up.   Why:  Pt has an appointment 03/05/2014 to apply for an [facility] card at [place].       Please follow up.   Why:  Pt instructed to present to [place] at [place] the first weekday after his D/C to request followup care, he still has his prescirption for a CBG meter.       Follow up with NNODI, ADAKU, MD. Schedule an appointment as soon as possible for a visit in 4 days.   Specialty:  Family Medicine   Why:  To be seen with repeat labs (CBC & BMP).   Contact information:   [place] [id number] [phone]        The results of significant diagnostics from this hospitalization (including imaging, microbiology, ancillary and laboratory) are listed below for reference.    Significant Diagnostic Studies: Dg Abd Portable 1v  02/03/2014   CLINICAL DATA:  Nausea and vomiting  EXAM: PORTABLE ABDOMEN - 1 VIEW   COMPARISON:  06/07/2013  FINDINGS: The bowel gas pattern is normal. No radio-opaque calculi or other significant radiographic abnormality are seen.  IMPRESSION: Negative.   Electronically Signed   By: Kerby Moors M.D.   On: 02/03/2014 08:15    Microbiology: No results found for this or any previous visit (from the past 240 hour(s)).   Labs: Basic Metabolic Panel:  Recent Labs Lab 02/16/14 0235 02/17/14 0555 02/18/14 0641  NA 138 140 139  K 4.7 4.3 3.6  CL 100 106 104  CO2 23 27 27   GLUCOSE 365* 171* 187*  BUN 16 15 13   CREATININE 2.07* 1.34 1.27  CALCIUM 9.6 9.0 8.7   Liver Function Tests: No results for input(s): AST, ALT, ALKPHOS, BILITOT, PROT, ALBUMIN in the last 168 hours. No results for input(s): LIPASE, AMYLASE in the last 168 hours. No results for input(s): AMMONIA in the last 168 hours. CBC:  Recent Labs Lab 02/16/14 0235 02/17/14 0555 02/18/14 0641  WBC 9.8 6.2 4.5  NEUTROABS 8.6*  --   --   HGB 13.4 11.9* 12.2*  HCT 40.3 37.3* 37.2*  MCV 80.6 83.4 83.4  PLT 328 256 277   Cardiac Enzymes: No results for input(s): CKTOTAL, CKMB, CKMBINDEX, TROPONINI in the last 168 hours. BNP: BNP (last 3 results) No results for input(s): PROBNP in the last 8760 hours. CBG:  Recent Labs Lab 02/17/14 1230 02/17/14 1644 02/17/14 2017 02/18/14 0740 02/18/14 1129  GLUCAP 171* 174* 148* 157* 163*        Signed:  Vernell Leep, MD, FACP, FHM. [facility] Pager [phone]  If 7PM-7AM, please contact night-coverage [email or web address] Password [email or web address] 02/18/2014, 3:02 PM

## 2014-02-22 LAB — GLUCOSE, CAPILLARY
GLUCOSE-CAPILLARY: 171 mg/dL — AB (ref 70–99)
GLUCOSE-CAPILLARY: 173 mg/dL — AB (ref 70–99)

## 2014-03-05 ENCOUNTER — Ambulatory Visit: Payer: BC Managed Care – PPO

## 2014-03-11 ENCOUNTER — Ambulatory Visit: Payer: Self-pay | Attending: Internal Medicine

## 2014-04-02 ENCOUNTER — Ambulatory Visit: Payer: Self-pay | Admitting: Internal Medicine

## 2014-04-29 ENCOUNTER — Inpatient Hospital Stay (HOSPITAL_COMMUNITY)
Admission: EM | Admit: 2014-04-29 | Discharge: 2014-05-03 | DRG: 074 | Disposition: A | Discharge status: Home / Self Care | Payer: Medicaid Other | Attending: Internal Medicine | Admitting: Internal Medicine

## 2014-04-29 ENCOUNTER — Encounter (HOSPITAL_COMMUNITY): Payer: Self-pay | Admitting: Emergency Medicine

## 2014-04-29 DIAGNOSIS — E108 Type 1 diabetes mellitus with unspecified complications: Secondary | ICD-10-CM

## 2014-04-29 DIAGNOSIS — E1140 Type 2 diabetes mellitus with diabetic neuropathy, unspecified: Secondary | ICD-10-CM | POA: Diagnosis present

## 2014-04-29 DIAGNOSIS — E1065 Type 1 diabetes mellitus with hyperglycemia: Secondary | ICD-10-CM

## 2014-04-29 DIAGNOSIS — IMO0002 Reserved for concepts with insufficient information to code with codable children: Secondary | ICD-10-CM

## 2014-04-29 DIAGNOSIS — E860 Dehydration: Secondary | ICD-10-CM | POA: Diagnosis present

## 2014-04-29 DIAGNOSIS — R1013 Epigastric pain: Secondary | ICD-10-CM | POA: Diagnosis present

## 2014-04-29 DIAGNOSIS — T383X6A Underdosing of insulin and oral hypoglycemic [antidiabetic] drugs, initial encounter: Secondary | ICD-10-CM | POA: Diagnosis present

## 2014-04-29 DIAGNOSIS — R109 Unspecified abdominal pain: Secondary | ICD-10-CM

## 2014-04-29 DIAGNOSIS — I10 Essential (primary) hypertension: Secondary | ICD-10-CM | POA: Diagnosis present

## 2014-04-29 DIAGNOSIS — Z91128 Patient's intentional underdosing of medication regimen for other reason: Secondary | ICD-10-CM | POA: Diagnosis present

## 2014-04-29 DIAGNOSIS — Z9119 Patient's noncompliance with other medical treatment and regimen: Secondary | ICD-10-CM

## 2014-04-29 DIAGNOSIS — Z794 Long term (current) use of insulin: Secondary | ICD-10-CM

## 2014-04-29 DIAGNOSIS — K3184 Gastroparesis: Secondary | ICD-10-CM | POA: Diagnosis present

## 2014-04-29 DIAGNOSIS — Z91199 Patient's noncompliance with other medical treatment and regimen due to unspecified reason: Secondary | ICD-10-CM

## 2014-04-29 DIAGNOSIS — N179 Acute kidney failure, unspecified: Secondary | ICD-10-CM | POA: Diagnosis present

## 2014-04-29 DIAGNOSIS — Z9114 Patient's other noncompliance with medication regimen: Secondary | ICD-10-CM | POA: Diagnosis present

## 2014-04-29 DIAGNOSIS — E1143 Type 2 diabetes mellitus with diabetic autonomic (poly)neuropathy: Secondary | ICD-10-CM

## 2014-04-29 DIAGNOSIS — Z833 Family history of diabetes mellitus: Secondary | ICD-10-CM

## 2014-04-29 LAB — CBC WITH DIFFERENTIAL/PLATELET
BASOS ABS: 0 10*3/uL (ref 0.0–0.1)
Basophils Relative: 0 % (ref 0–1)
Eosinophils Absolute: 0.1 10*3/uL (ref 0.0–0.7)
Eosinophils Relative: 1 % (ref 0–5)
HEMATOCRIT: 42.1 % (ref 39.0–52.0)
Hemoglobin: 13.8 g/dL (ref 13.0–17.0)
Lymphocytes Relative: 40 % (ref 12–46)
Lymphs Abs: 2.9 10*3/uL (ref 0.7–4.0)
MCH: 27.1 pg (ref 26.0–34.0)
MCHC: 32.8 g/dL (ref 30.0–36.0)
MCV: 82.5 fL (ref 78.0–100.0)
Monocytes Absolute: 0.6 10*3/uL (ref 0.1–1.0)
Monocytes Relative: 8 % (ref 3–12)
Neutro Abs: 3.8 10*3/uL (ref 1.7–7.7)
Neutrophils Relative %: 51 % (ref 43–77)
PLATELETS: 278 10*3/uL (ref 150–400)
RBC: 5.1 MIL/uL (ref 4.22–5.81)
RDW: 12.6 % (ref 11.5–15.5)
WBC: 7.3 10*3/uL (ref 4.0–10.5)

## 2014-04-29 LAB — COMPREHENSIVE METABOLIC PANEL
ALT: 11 U/L (ref 0–53)
AST: 17 U/L (ref 0–37)
Albumin: 4.3 g/dL (ref 3.5–5.2)
Alkaline Phosphatase: 91 U/L (ref 39–117)
Anion gap: 6 (ref 5–15)
BUN: 11 mg/dL (ref 6–23)
CALCIUM: 9.4 mg/dL (ref 8.4–10.5)
CO2: 28 mmol/L (ref 19–32)
CREATININE: 1.26 mg/dL (ref 0.50–1.35)
Chloride: 105 mmol/L (ref 96–112)
GFR calc non Af Amer: 72 mL/min — ABNORMAL LOW (ref >90)
GFR, EST AFRICAN AMERICAN: 83 mL/min — AB (ref >90)
GLUCOSE: 215 mg/dL — AB (ref 70–99)
Potassium: 4.3 mmol/L (ref 3.5–5.1)
Sodium: 139 mmol/L (ref 135–145)
Total Bilirubin: 0.8 mg/dL (ref 0.3–1.2)
Total Protein: 7.2 g/dL (ref 6.0–8.3)

## 2014-04-29 LAB — I-STAT TROPONIN, ED: TROPONIN I, POC: 0 ng/mL (ref 0.00–0.08)

## 2014-04-29 LAB — LIPASE, BLOOD: Lipase: 23 U/L (ref 11–59)

## 2014-04-29 MED ORDER — ONDANSETRON 4 MG PO TBDP
8.0000 mg | ORAL_TABLET | Freq: Once | ORAL | Status: AC
  Administered 2014-04-29: 8 mg via ORAL

## 2014-04-29 MED ORDER — ONDANSETRON 4 MG PO TBDP
ORAL_TABLET | ORAL | Status: AC
  Filled 2014-04-29: qty 2

## 2014-04-29 NOTE — ED Notes (Signed)
Onset today nausea and emesis multiple episodes with chest pain along with diaphoresis. States have been in ED recently for the same symptoms.

## 2014-04-30 ENCOUNTER — Emergency Department (HOSPITAL_COMMUNITY): Payer: Medicaid Other

## 2014-04-30 ENCOUNTER — Ambulatory Visit: Payer: Self-pay | Admitting: Internal Medicine

## 2014-04-30 DIAGNOSIS — E0843 Diabetes mellitus due to underlying condition with diabetic autonomic (poly)neuropathy: Secondary | ICD-10-CM

## 2014-04-30 DIAGNOSIS — K3184 Gastroparesis: Secondary | ICD-10-CM

## 2014-04-30 DIAGNOSIS — I10 Essential (primary) hypertension: Secondary | ICD-10-CM

## 2014-04-30 DIAGNOSIS — Z91199 Patient's noncompliance with other medical treatment and regimen due to unspecified reason: Secondary | ICD-10-CM

## 2014-04-30 DIAGNOSIS — Z9119 Patient's noncompliance with other medical treatment and regimen: Secondary | ICD-10-CM

## 2014-04-30 DIAGNOSIS — R1013 Epigastric pain: Secondary | ICD-10-CM

## 2014-04-30 LAB — COMPREHENSIVE METABOLIC PANEL
ALK PHOS: 77 U/L (ref 39–117)
ALT: 18 U/L (ref 0–53)
AST: 25 U/L (ref 0–37)
Albumin: 4.1 g/dL (ref 3.5–5.2)
Anion gap: 8 (ref 5–15)
BILIRUBIN TOTAL: 0.8 mg/dL (ref 0.3–1.2)
BUN: 11 mg/dL (ref 6–23)
CALCIUM: 9.1 mg/dL (ref 8.4–10.5)
CHLORIDE: 106 mmol/L (ref 96–112)
CO2: 26 mmol/L (ref 19–32)
CREATININE: 1.26 mg/dL (ref 0.50–1.35)
GFR calc Af Amer: 83 mL/min — ABNORMAL LOW (ref >90)
GFR, EST NON AFRICAN AMERICAN: 72 mL/min — AB (ref >90)
GLUCOSE: 221 mg/dL — AB (ref 70–99)
Potassium: 5.3 mmol/L — ABNORMAL HIGH (ref 3.5–5.1)
Sodium: 140 mmol/L (ref 135–145)
Total Protein: 7.4 g/dL (ref 6.0–8.3)

## 2014-04-30 LAB — URINALYSIS, ROUTINE W REFLEX MICROSCOPIC
Bilirubin Urine: NEGATIVE
Ketones, ur: 15 mg/dL — AB
LEUKOCYTES UA: NEGATIVE
Nitrite: NEGATIVE
PROTEIN: NEGATIVE mg/dL
Specific Gravity, Urine: 1.023 (ref 1.005–1.030)
Urobilinogen, UA: 1 mg/dL (ref 0.0–1.0)
pH: 6 (ref 5.0–8.0)

## 2014-04-30 LAB — RAPID URINE DRUG SCREEN, HOSP PERFORMED
AMPHETAMINES: NOT DETECTED
Barbiturates: NOT DETECTED
Benzodiazepines: NOT DETECTED
COCAINE: NOT DETECTED
Opiates: NOT DETECTED
TETRAHYDROCANNABINOL: NOT DETECTED

## 2014-04-30 LAB — GLUCOSE, CAPILLARY
GLUCOSE-CAPILLARY: 200 mg/dL — AB (ref 70–99)
GLUCOSE-CAPILLARY: 165 mg/dL — AB (ref 70–99)
Glucose-Capillary: 168 mg/dL — ABNORMAL HIGH (ref 70–99)

## 2014-04-30 LAB — URINE MICROSCOPIC-ADD ON

## 2014-04-30 LAB — CBG MONITORING, ED: GLUCOSE-CAPILLARY: 230 mg/dL — AB (ref 70–99)

## 2014-04-30 MED ORDER — AMLODIPINE BESYLATE 10 MG PO TABS
10.0000 mg | ORAL_TABLET | Freq: Every morning | ORAL | Status: DC
  Administered 2014-04-30 – 2014-05-02 (×3): 10 mg via ORAL
  Filled 2014-04-30 (×3): qty 1

## 2014-04-30 MED ORDER — INSULIN ASPART 100 UNIT/ML ~~LOC~~ SOLN
0.0000 [IU] | Freq: Four times a day (QID) | SUBCUTANEOUS | Status: DC
  Administered 2014-05-01: 2 [IU] via SUBCUTANEOUS
  Administered 2014-05-01 (×2): 3 [IU] via SUBCUTANEOUS

## 2014-04-30 MED ORDER — ACETAMINOPHEN 650 MG RE SUPP: 650.0000 mg | Freq: Four times a day (QID) | RECTAL | Status: DC | PRN

## 2014-04-30 MED ORDER — SODIUM CHLORIDE 0.9 % IV SOLN
INTRAVENOUS | Status: DC
  Administered 2014-04-30: 09:00:00 via INTRAVENOUS

## 2014-04-30 MED ORDER — SODIUM CHLORIDE 0.9 % IV SOLN
INTRAVENOUS | Status: DC
  Administered 2014-04-30: 11:00:00 via INTRAVENOUS
  Administered 2014-04-30: 1000 mL via INTRAVENOUS
  Administered 2014-04-30 – 2014-05-02 (×5): via INTRAVENOUS

## 2014-04-30 MED ORDER — SODIUM CHLORIDE 0.9 % IV SOLN
INTRAVENOUS | Status: DC
Start: 2014-04-30 — End: 2014-04-30

## 2014-04-30 MED ORDER — HYDROMORPHONE HCL 1 MG/ML IJ SOLN
1.0000 mg | Freq: Once | INTRAMUSCULAR | Status: AC
  Administered 2014-04-30: 1 mg via INTRAVENOUS
  Filled 2014-04-30: qty 1

## 2014-04-30 MED ORDER — PROMETHAZINE HCL 25 MG/ML IJ SOLN
12.5000 mg | Freq: Once | INTRAMUSCULAR | Status: AC
  Administered 2014-04-30: 12.5 mg via INTRAVENOUS
  Filled 2014-04-30: qty 1

## 2014-04-30 MED ORDER — HYDROMORPHONE HCL 1 MG/ML IJ SOLN: 1.0000 mg | INTRAMUSCULAR | Status: DC | PRN

## 2014-04-30 MED ORDER — HEPARIN SODIUM (PORCINE) 5000 UNIT/ML IJ SOLN
5000.0000 [IU] | Freq: Three times a day (TID) | INTRAMUSCULAR | Status: DC
  Administered 2014-04-30 – 2014-05-03 (×10): 5000 [IU] via SUBCUTANEOUS
  Filled 2014-04-30 (×9): qty 1

## 2014-04-30 MED ORDER — SODIUM CHLORIDE 0.9 % IV BOLUS (SEPSIS)
1000.0000 mL | Freq: Once | INTRAVENOUS | Status: AC
  Administered 2014-04-30: 1000 mL via INTRAVENOUS

## 2014-04-30 MED ORDER — PROMETHAZINE HCL 25 MG/ML IJ SOLN
6.2500 mg | Freq: Four times a day (QID) | INTRAMUSCULAR | Status: DC | PRN
  Administered 2014-04-30: 6.25 mg via INTRAVENOUS
  Filled 2014-04-30 (×2): qty 1

## 2014-04-30 MED ORDER — METOCLOPRAMIDE HCL 5 MG/ML IJ SOLN
10.0000 mg | Freq: Four times a day (QID) | INTRAMUSCULAR | Status: DC
  Administered 2014-04-30 – 2014-05-03 (×12): 10 mg via INTRAVENOUS
  Filled 2014-04-30 (×10): qty 2

## 2014-04-30 MED ORDER — METOCLOPRAMIDE HCL 10 MG PO TABS
10.0000 mg | ORAL_TABLET | Freq: Three times a day (TID) | ORAL | Status: DC
  Administered 2014-04-30 (×2): 10 mg via ORAL
  Filled 2014-04-30 (×6): qty 1

## 2014-04-30 MED ORDER — INSULIN ASPART 100 UNIT/ML ~~LOC~~ SOLN
0.0000 [IU] | Freq: Four times a day (QID) | SUBCUTANEOUS | Status: DC
  Administered 2014-04-30: 3 [IU] via SUBCUTANEOUS
  Administered 2014-04-30: 5 [IU] via SUBCUTANEOUS
  Administered 2014-04-30: 3 [IU] via SUBCUTANEOUS
  Filled 2014-04-30: qty 1

## 2014-04-30 MED ORDER — SUCRALFATE 1 G PO TABS
1.0000 g | ORAL_TABLET | Freq: Three times a day (TID) | ORAL | Status: DC
  Administered 2014-04-30 – 2014-05-03 (×11): 1 g via ORAL
  Filled 2014-04-30 (×12): qty 1

## 2014-04-30 MED ORDER — PROMETHAZINE HCL 25 MG/ML IJ SOLN
12.5000 mg | Freq: Four times a day (QID) | INTRAMUSCULAR | Status: DC | PRN
  Administered 2014-04-30 – 2014-05-01 (×2): 12.5 mg via INTRAVENOUS
  Filled 2014-04-30: qty 1

## 2014-04-30 MED ORDER — GABAPENTIN 300 MG PO CAPS
300.0000 mg | ORAL_CAPSULE | Freq: Every day | ORAL | Status: DC
  Administered 2014-04-30 – 2014-05-02 (×3): 300 mg via ORAL
  Filled 2014-04-30 (×3): qty 1

## 2014-04-30 MED ORDER — ONDANSETRON HCL 4 MG/2ML IJ SOLN
4.0000 mg | Freq: Three times a day (TID) | INTRAMUSCULAR | Status: AC | PRN
  Administered 2014-04-30: 4 mg via INTRAVENOUS
  Filled 2014-04-30: qty 2

## 2014-04-30 MED ORDER — KETOROLAC TROMETHAMINE 30 MG/ML IJ SOLN
30.0000 mg | Freq: Three times a day (TID) | INTRAMUSCULAR | Status: DC | PRN
  Administered 2014-04-30 – 2014-05-02 (×3): 30 mg via INTRAVENOUS
  Filled 2014-04-30 (×3): qty 1

## 2014-04-30 MED ORDER — HYDRALAZINE HCL 20 MG/ML IJ SOLN: 10.0000 mg | Freq: Four times a day (QID) | INTRAMUSCULAR | Status: DC | PRN

## 2014-04-30 MED ORDER — ACETAMINOPHEN 325 MG PO TABS: 650.0000 mg | ORAL_TABLET | Freq: Four times a day (QID) | ORAL | Status: DC | PRN

## 2014-04-30 MED ORDER — METOPROLOL TARTRATE 50 MG PO TABS
50.0000 mg | ORAL_TABLET | Freq: Two times a day (BID) | ORAL | Status: DC
  Administered 2014-04-30 – 2014-05-03 (×7): 50 mg via ORAL
  Filled 2014-04-30 (×7): qty 1

## 2014-04-30 MED ORDER — PANTOPRAZOLE SODIUM 40 MG PO TBEC
40.0000 mg | DELAYED_RELEASE_TABLET | Freq: Two times a day (BID) | ORAL | Status: DC
  Administered 2014-04-30 – 2014-05-03 (×7): 40 mg via ORAL
  Filled 2014-04-30 (×7): qty 1

## 2014-04-30 MED ORDER — PROMETHAZINE HCL 25 MG PO TABS
25.0000 mg | ORAL_TABLET | Freq: Four times a day (QID) | ORAL | Status: DC | PRN
  Filled 2014-04-30: qty 1

## 2014-04-30 NOTE — ED Notes (Signed)
Per Dr Rogene Houston: hospitalist is not admitting patient for now, although bed has been placed on hold.  Case management is to see patient and attempt to resolve issues with patient obtaining his medications.  If this is not possible, hospitalist will reevaluate.

## 2014-04-30 NOTE — Progress Notes (Signed)
[facility] PROGRESS NOTE   Duane [name] [id number] DOB: [birth year] DOA: 04/29/2014 PCP: [name], MD  HPI/Subjective: Does not keep eye contact, denies any fever or chills. Still complains about nausea and vomiting and epigastric abdominal pain  Assessment/Plan: Principal Problem:   Gastroparesis Active Problems:   Diabetic neuropathy   Epigastric pain   Essential hypertension   Noncompliance   Patient seen earlier today by my colleague Dr. Posey Pronto. Chart and data base reviewed, patient seen and examined. Intractable nausea and vomiting likely secondary to gastroparesis flareup. Patient is not taking his medication for the past 2-3 weeks. Restarted Reglan and Phenergan for nausea.  Code Status: Full Code Family Communication: Plan discussed with the patient. Disposition Plan: Remains inpatient Diet: Diet clear liquid  Consultants:  None  Procedures:  None  Antibiotics:  None   Objective: Filed Vitals:   04/30/14 1028  BP: 171/106  Pulse: 109  Temp: 98.2 F (36.8 C)  Resp:     Intake/Output Summary (Last 24 hours) at 04/30/14 1424 Last data filed at 04/30/14 0334  Gross per 24 hour  Intake      0 ml  Output    200 ml  Net   -200 ml   Filed Weights   04/29/14 2227 04/29/14 2245 04/30/14 1028  Weight: 124.739 kg (275 lb) 124.739 kg (275 lb) 123.5 kg (272 lb 4.3 oz)    Exam: General: Alert and awake, oriented x3, not in any acute distress. HEENT: anicteric sclera, pupils reactive to light and accommodation, EOMI CVS: S1-S2 clear, no murmur rubs or gallops Chest: clear to auscultation bilaterally, no wheezing, rales or rhonchi Abdomen: soft nontender, nondistended, normal bowel sounds, no organomegaly Extremities: no cyanosis, clubbing or edema noted bilaterally Neuro: Cranial nerves II-XII intact, no focal neurological deficits  Data Reviewed: Basic Metabolic Panel:  Recent Labs Lab 04/29/14 2238 04/30/14 0640  NA 139 140  K  4.3 5.3*  CL 105 106  CO2 28 26  GLUCOSE 215* 221*  BUN 11 11  CREATININE 1.26 1.26  CALCIUM 9.4 9.1   Liver Function Tests:  Recent Labs Lab 04/29/14 2238 04/30/14 0640  AST 17 25  ALT 11 18  ALKPHOS 91 77  BILITOT 0.8 0.8  PROT 7.2 7.4  ALBUMIN 4.3 4.1    Recent Labs Lab 04/29/14 2238  LIPASE 23   No results for input(s): AMMONIA in the last 168 hours. CBC:  Recent Labs Lab 04/29/14 2238  WBC 7.3  NEUTROABS 3.8  HGB 13.8  HCT 42.1  MCV 82.5  PLT 278   Cardiac Enzymes: No results for input(s): CKTOTAL, CKMB, CKMBINDEX, TROPONINI in the last 168 hours. BNP (last 3 results) No results for input(s): BNP in the last 8760 hours.  ProBNP (last 3 results) No results for input(s): PROBNP in the last 8760 hours.  CBG:  Recent Labs Lab 04/30/14 0805 04/30/14 1317  GLUCAP 230* 200*    Micro No results found for this or any previous visit (from the past 240 hour(s)).   Studies: Dg Abd Acute W/chest  04/30/2014   CLINICAL DATA:  Acute onset of generalized abdominal pain and nausea. Initial encounter.  EXAM: ACUTE ABDOMEN SERIES (ABDOMEN 2 VIEW & CHEST 1 VIEW)  COMPARISON:  Chest radiograph performed 12/30/2013, and abdominal radiograph performed 02/03/2014  FINDINGS: The lungs are well-aerated and clear. There is no evidence of focal opacification, pleural effusion or pneumothorax. The cardiomediastinal silhouette is borderline enlarged.  The visualized bowel gas pattern is unremarkable. Scattered stool and  air are seen within the colon; there is no evidence of small bowel dilatation to suggest obstruction. No free intra-abdominal air is identified on the provided upright view. Clips are noted within the right upper quadrant, reflecting prior cholecystectomy.  No acute osseous abnormalities are seen; the sacroiliac joints are unremarkable in appearance.  IMPRESSION: 1. Unremarkable bowel gas pattern; no free intra-abdominal air seen. Small to moderate amount of stool  noted in the colon. 2. Borderline cardiomegaly; lungs remain clear.   Electronically Signed   By: Garald Balding M.D.   On: 04/30/2014 02:56    Scheduled Meds: . amLODipine  10 mg Oral q morning - 10a  . gabapentin  300 mg Oral QHS  . heparin  5,000 Units Subcutaneous 3 times per day  . insulin aspart  0-15 Units Subcutaneous Q6H  . metoCLOPramide (REGLAN) injection  10 mg Intravenous 4 times per day  . metoprolol tartrate  50 mg Oral BID  . pantoprazole  40 mg Oral BID  . sucralfate  1 g Oral TID WC & HS   Continuous Infusions: . sodium chloride 125 mL/hr at 04/30/14 1108       Time spent: 35 minutes    [facility] A  [facility] Pager [phone] If 7PM-7AM, please contact night-coverage at [email or web address], password [email or web address] 04/30/2014, 2:24 PM  LOS: 0 days

## 2014-04-30 NOTE — ED Provider Notes (Addendum)
CSN: [id number]     Arrival date & time 04/29/14  2223 History  This chart was scribed for Fredia Sorrow, MD by [name], ED Scribe. This patient was seen in room B15C/B15C and the patient's care was started at 12:42 AM.   Chief Complaint  Patient presents with  . Chest Pain  . Abdominal Pain   Patient is a 39 y.o. male presenting with abdominal pain. The history is provided by the patient. No language interpreter was used.  Abdominal Pain Pain location: Upper. Pain quality: aching   Pain radiates to:  Does not radiate Pain severity:  Severe Duration:  16 hours Timing:  Constant Progression:  Unchanged Chronicity:  Recurrent Relieved by:  Nothing Worsened by:  Nothing tried Associated symptoms: nausea, shortness of breath and vomiting   Associated symptoms: no chest pain, no chills, no cough, no diarrhea, no dysuria, no fever and no sore throat      HPI Comments: Alain Micallef is a 39 y.o. male, with history of DM, HTN, gastritis, and gastroparesis, who presents to the Emergency Department complaining of constant, 10/10, upper abdominal pain onset yesterday morning at approximately 0900. He notes associated multiple episodes of vomiting later that afternoon, in addition to SOB and generalized headahce. He denies hematochezia or hematemesis. He reports history of the same and has been seen in the ED recently for same symptoms and reports he is usually given Phenergan, Zofran, and Dilaudid with relief. Patient is currently on Reglan, sucralfate, and Protonix for his symptoms. Patient denies fever, chills, cough, rhinorrhea, sore throat, leg swelling, visual disturbances, diarrhea, dysuria, back pain, neck pain, rash, dizziness, or light-headedness. Patient also denies any chest pain despite complaining of this in triage.     PCP: [name], MD - [facility]   Past Medical History  Diagnosis Date  . Hypertension   . Diabetes mellitus without complication age 9     insulin requiring from the start  . Gastritis   . Gastroparesis 2012    100% gastric retention on 08/2013 [facility]  . Obesity   . Diabetic neuropathy    Past Surgical History  Procedure Laterality Date  . Cholecystectomy  2013  . Knee surgery Left   . Flexible sigmoidoscopy N/A 06/11/2013    Procedure: FLEXIBLE SIGMOIDOSCOPY;  Surgeon: [name], MD;  Location: [facility];  Service: Endoscopy;  Laterality: N/A;  . Esophagogastroduodenoscopy N/A 06/11/2013    Procedure: ESOPHAGOGASTRODUODENOSCOPY (EGD);  Surgeon: [name], MD;  Location: [facility];  Service: Endoscopy;  Laterality: N/A;   Family History  Problem Relation Age of Onset  . Diabetes Father    History  Substance Use Topics  . Smoking status: Never Smoker   . Smokeless tobacco: Never Used  . Alcohol Use: Yes     Comment: "none really" - 08/27/13    Review of Systems  Constitutional: Negative for fever and chills.  HENT: Negative for rhinorrhea and sore throat.   Eyes: Negative for visual disturbance.  Respiratory: Positive for shortness of breath. Negative for cough.   Cardiovascular: Negative for chest pain and leg swelling.  Gastrointestinal: Positive for nausea, vomiting and abdominal pain. Negative for diarrhea.  Genitourinary: Negative for dysuria.  Musculoskeletal: Negative for back pain and neck pain.  Skin: Negative for rash.  Neurological: Positive for headaches. Negative for dizziness and light-headedness.  Hematological: Does not bruise/bleed easily.  Psychiatric/Behavioral: Negative for confusion.      Allergies  Review of patient's allergies indicates no known allergies.  Home Medications   Prior to Admission medications   Medication Sig Start Date End Date Taking? Authorizing Provider  acetaminophen (TYLENOL) 500 MG tablet Take 500 mg by mouth every 6 (six) hours as needed for mild pain.     Historical Provider, MD  amLODipine (NORVASC) 10 MG tablet Take 1 tablet (10 mg total) by  mouth every morning. 02/05/14   [name], MD  cloNIDine (CATAPRES) 0.1 MG tablet Take 1 tablet (0.1 mg total) by mouth 2 (two) times daily. 02/05/14   [name], MD  gabapentin (NEURONTIN) 300 MG capsule Take 1 capsule (300 mg total) by mouth at bedtime. 02/05/14   [name], MD  glucose blood (ONE TOUCH ULTRA TEST) test strip Use as instructed to check blood sugar once a day dx code 250.00 02/05/14   [name], MD  Insulin Aspart Prot & Aspart (NOVOLOG 70/30 MIX) (70-30) 100 UNIT/ML Pen Inject 8 Units into the skin 2 (two) times daily. 02/05/14   [name], MD  metoCLOPramide (REGLAN) 10 MG tablet Take 1 tablet (10 mg total) by mouth 4 (four) times daily. 02/05/14   [name], MD  metoprolol (LOPRESSOR) 50 MG tablet Take 1 tablet (50 mg total) by mouth 2 (two) times daily. 02/05/14   [name], MD  pantoprazole (PROTONIX) 40 MG tablet Take 1 tablet (40 mg total) by mouth 2 (two) times daily. 01/10/14   [name], MD  promethazine (PHENERGAN) 25 MG tablet Take 1 tablet (25 mg total) by mouth every 8 (eight) hours as needed for nausea or vomiting. 01/10/14   [name], MD  sucralfate (CARAFATE) 1 G tablet Take 1 tablet (1 g total) by mouth 4 (four) times daily -  with meals and at bedtime. 02/05/14   [name], MD   Triage Vitals: BP 179/109 mmHg  Pulse 99  Temp(Src) 99.5 F (37.5 C) (Oral)  Resp 22  Ht 6' (1.829 m)  Wt 275 lb (124.739 kg)  BMI 37.29 kg/m2  SpO2 99%  Physical Exam  Constitutional: He is oriented to person, place, and time. He appears well-developed and well-nourished. No distress.  HENT:  Head: Normocephalic and atraumatic.  Eyes: Conjunctivae and EOM are normal.  Sclera clear. Pupils normal. Eyes track normal.  Neck: Neck supple. No tracheal deviation present.  Cardiovascular: Normal rate, regular rhythm and normal heart sounds.   No murmur heard. Pulmonary/Chest: Effort normal and breath sounds  normal. No respiratory distress.  Abdominal: Soft. Bowel sounds are decreased. There is no tenderness.  Musculoskeletal: Normal range of motion. He exhibits no edema.  No swelling in the ankles.  Neurological: He is alert and oriented to person, place, and time.  Moves all extremiteis well.  Skin: Skin is warm and dry.  Psychiatric: He has a normal mood and affect. His behavior is normal.  Nursing note and vitals reviewed.   ED Course  Procedures (including critical care time)  DIAGNOSTIC STUDIES: Oxygen Saturation is 99% on room air, normal by my interpretation.    COORDINATION OF CARE: At 2 Discussed treatment plan with patient which includes antiemetic, pain medication, and imaging. Patient agrees.    Medications  0.9 %  sodium chloride infusion (1,000 mLs Intravenous New Bag/Given 04/30/14 0251)  HYDROmorphone (DILAUDID) injection 1 mg (not administered)  ondansetron (ZOFRAN-ODT) disintegrating tablet 8 mg (8 mg Oral Given 04/29/14 2241)  sodium chloride 0.9 % bolus 1,000 mL (0 mLs Intravenous Stopped 04/30/14 0251)  HYDROmorphone (DILAUDID) injection 1 mg (1 mg Intravenous Given 04/30/14 0136)  promethazine (PHENERGAN) injection 12.5 mg (12.5 mg Intravenous Given 04/30/14 0130)  promethazine (PHENERGAN) injection 12.5 mg (12.5 mg Intravenous Given 04/30/14 0251)   Results for orders placed or performed during the hospital encounter of 04/29/14  CBC with Differential  Result Value Ref Range   WBC 7.3 4.0 - 10.5 K/uL   RBC 5.10 4.22 - 5.81 MIL/uL   Hemoglobin 13.8 13.0 - 17.0 g/dL   HCT 42.1 39.0 - 52.0 %   MCV 82.5 78.0 - 100.0 fL   MCH 27.1 26.0 - 34.0 pg   MCHC 32.8 30.0 - 36.0 g/dL   RDW 12.6 11.5 - 15.5 %   Platelets 278 150 - 400 K/uL   Neutrophils Relative % 51 43 - 77 %   Neutro Abs 3.8 1.7 - 7.7 K/uL   Lymphocytes Relative 40 12 - 46 %   Lymphs Abs 2.9 0.7 - 4.0 K/uL   Monocytes Relative 8 3 - 12 %   Monocytes Absolute 0.6 0.1 - 1.0 K/uL   Eosinophils Relative  1 0 - 5 %   Eosinophils Absolute 0.1 0.0 - 0.7 K/uL   Basophils Relative 0 0 - 1 %   Basophils Absolute 0.0 0.0 - 0.1 K/uL  Comprehensive metabolic panel  Result Value Ref Range   Sodium 139 135 - 145 mmol/L   Potassium 4.3 3.5 - 5.1 mmol/L   Chloride 105 96 - 112 mmol/L   CO2 28 19 - 32 mmol/L   Glucose, Bld 215 (H) 70 - 99 mg/dL   BUN 11 6 - 23 mg/dL   Creatinine, Ser 1.26 0.50 - 1.35 mg/dL   Calcium 9.4 8.4 - 10.5 mg/dL   Total Protein 7.2 6.0 - 8.3 g/dL   Albumin 4.3 3.5 - 5.2 g/dL   AST 17 0 - 37 U/L   ALT 11 0 - 53 U/L   Alkaline Phosphatase 91 39 - 117 U/L   Total Bilirubin 0.8 0.3 - 1.2 mg/dL   GFR calc non Af Amer 72 (L) >90 mL/min   GFR calc Af Amer 83 (L) >90 mL/min   Anion gap 6 5 - 15  Lipase, blood  Result Value Ref Range   Lipase 23 11 - 59 U/L  I-stat troponin, ED (only if pt is 38 y.o. or older & pain is above umbilicus) - do not order at [facility]  Result Value Ref Range   Troponin i, poc 0.00 0.00 - 0.08 ng/mL   Comment 3           Imaging Review Dg Abd Acute W/chest  04/30/2014   CLINICAL DATA:  Acute onset of generalized abdominal pain and nausea. Initial encounter.  EXAM: ACUTE ABDOMEN SERIES (ABDOMEN 2 VIEW & CHEST 1 VIEW)  COMPARISON:  Chest radiograph performed 12/30/2013, and abdominal radiograph performed 02/03/2014  FINDINGS: The lungs are well-aerated and clear. There is no evidence of focal opacification, pleural effusion or pneumothorax. The cardiomediastinal silhouette is borderline enlarged.  The visualized bowel gas pattern is unremarkable. Scattered stool and air are seen within the colon; there is no evidence of small bowel dilatation to suggest obstruction. No free intra-abdominal air is identified on the provided upright view. Clips are noted within the right upper quadrant, reflecting prior cholecystectomy.  No acute osseous abnormalities are seen; the sacroiliac joints are unremarkable in appearance.  IMPRESSION: 1. Unremarkable bowel gas pattern; no  free intra-abdominal air seen. Small to moderate amount of stool noted in the colon. 2. Borderline cardiomegaly; lungs remain clear.  Electronically Signed   By: Garald Balding M.D.   On: 04/30/2014 02:56      EKG Interpretation   Date/Time:  Thursday April 29 2014 22:40:03 EDT Ventricular Rate:  94 PR Interval:  160 QRS Duration: 88 QT Interval:  358 QTC Calculation: 447 R Axis:   69 Text Interpretation:  Normal sinus rhythm Normal ECG Confirmed by  Dezmon Conover  MD, Calianna Kim ([id number]) on 04/30/2014 12:38:58 AM      MDM   Final diagnoses:  Abdominal pain  Gastroparesis due to DM     Patient with known history of gastroparesis. Patient with persistent vomiting. The patient was some improvement with the hydromorphone and Phenergan. No significant electrolyte abnormalities. Blood sugar is moderately elevated with no evidence of acidosis. Patient will require admission. Patient is followed by [facility] physicians.   I personally performed the services described in this documentation, which was scribed in my presence. The recorded information has been reviewed and is accurate.     Fredia Sorrow, MD 04/30/14 [phone]  Addendum: Patient seen by hospitalist team. They feel that he does not require admission. However he does need to be seen by the case manager. Patient did tell them that he ran out of his medications and is not able to get them filled. Patient has been seen frequently for this nausea and vomiting gastroparesis type syndrome. Patient now resting more comfortably. Patient electrolytes are pretty stable. Will hold patient for consultation with case manager. Case manager not be available until 8:30.  Fredia Sorrow, MD 04/30/14 [phone]

## 2014-04-30 NOTE — ED Notes (Signed)
Attempted report 

## 2014-04-30 NOTE — H&P (Signed)
[facility] History and Physical  Patient: Duane [name]  MRN: [id number]  DOB: [birth year]  DOS: the patient was seen and examined on 04/30/2014 PCP: [name], MD  Chief Complaint: Abdominal pain nausea and vomiting  HPI: Duane [name] is a 39 y.o. male with Past medical history of diabetes mellitus, hypertension, gastroparesis, neuropathy, obesity. The patient is presenting with complaints of recurrent abdominal pain. Patient mentions that he ran out of his diabetes medication again 3 weeks ago and has not been taking them. Patient also mentions that [facility] but has not seen any physician there. Patient mentions he does not have any insurance and has not also gone to the [facility] card appointment. With this the patient presents with complaints of 3 days ounces of nausea vomiting as well as epigastric pain. This typical of his gastroparesis flareup. He denies any fever or chills denies any diarrhea last bowel movement was this morning. Denies any burning urination. As some leg swelling. No new allergies. Denies any drug abuse.  The patient is coming from home. And at his baseline independent for most of his ADL.  Review of Systems: as mentioned in the history of present illness.  A Comprehensive review of the other systems is negative.  Past Medical History  Diagnosis Date  . Hypertension   . Diabetes mellitus without complication age 65    insulin requiring from the start  . Gastritis   . Gastroparesis 2012    100% gastric retention on 08/2013 [facility]  . Obesity   . Diabetic neuropathy    Past Surgical History  Procedure Laterality Date  . Cholecystectomy  2013  . Knee surgery Left   . Flexible sigmoidoscopy N/A 06/11/2013    Procedure: FLEXIBLE SIGMOIDOSCOPY;  Surgeon: [name], MD;  Location: [facility];  Service: Endoscopy;  Laterality: N/A;  . Esophagogastroduodenoscopy N/A 06/11/2013    Procedure:  ESOPHAGOGASTRODUODENOSCOPY (EGD);  Surgeon: [name], MD;  Location: [facility];  Service: Endoscopy;  Laterality: N/A;   Social History:  reports that he has never smoked. He has never used smokeless tobacco. He reports that he drinks alcohol. He reports that he does not use illicit drugs.  No Known Allergies  Family History  Problem Relation Age of Onset  . Diabetes Father     Prior to Admission medications   Medication Sig Start Date End Date Taking? Authorizing Provider  cloNIDine (CATAPRES) 0.1 MG tablet Take 1 tablet (0.1 mg total) by mouth 2 (two) times daily. 02/05/14  Yes [name], MD  acetaminophen (TYLENOL) 500 MG tablet Take 500 mg by mouth every 6 (six) hours as needed for mild pain.     Historical Provider, MD  amLODipine (NORVASC) 10 MG tablet Take 1 tablet (10 mg total) by mouth every morning. 02/05/14   [name], MD  gabapentin (NEURONTIN) 300 MG capsule Take 1 capsule (300 mg total) by mouth at bedtime. 02/05/14   [name], MD  glucose blood (ONE TOUCH ULTRA TEST) test strip Use as instructed to check blood sugar once a day dx code 250.00 02/05/14   [name], MD  Insulin Aspart Prot & Aspart (NOVOLOG 70/30 MIX) (70-30) 100 UNIT/ML Pen Inject 8 Units into the skin 2 (two) times daily. 02/05/14   [name], MD  metoCLOPramide (REGLAN) 10 MG tablet Take 1 tablet (10 mg total) by mouth 4 (four) times daily. 02/05/14   [name], MD  metoprolol (LOPRESSOR) 50 MG tablet Take 1 tablet (  50 mg total) by mouth 2 (two) times daily. 02/05/14   [name], MD  pantoprazole (PROTONIX) 40 MG tablet Take 1 tablet (40 mg total) by mouth 2 (two) times daily. 01/10/14   [name], MD  promethazine (PHENERGAN) 25 MG tablet Take 1 tablet (25 mg total) by mouth every 8 (eight) hours as needed for nausea or vomiting. 01/10/14   [name], MD  sucralfate (CARAFATE) 1 G tablet Take 1 tablet (1 g total) by mouth 4 (four) times  daily -  with meals and at bedtime. 02/05/14   [name], MD    Physical Exam: Filed Vitals:   04/30/14 0045 04/30/14 0230 04/30/14 0300 04/30/14 0400  BP: 171/99 151/88 126/71 130/79  Pulse: 87 103 101 91  Temp:      TempSrc:      Resp: 18 23 17 16   Height:      Weight:      SpO2: 99% 97% 98% 95%    General: Alert, Awake and Oriented to Time, Place and Person. Appear in mild distress Eyes: PERRL ENT: Oral Mucosa clear moist. Neck: no JVD Cardiovascular: S1 and S2 Present, no Murmur, Peripheral Pulses Present Respiratory: Bilateral Air entry equal and Decreased, Clear to Auscultation, noCrackles, no wheezes Abdomen: Bowel Sound  present , Soft and diffusely tender Skin: no Rash Extremities: Trace  Pedal edema, no calf tenderness Neurologic: Grossly no focal neuro deficit.  Labs on Admission:  CBC:  Recent Labs Lab 04/29/14 2238  WBC 7.3  NEUTROABS 3.8  HGB 13.8  HCT 42.1  MCV 82.5  PLT 278    CMP     Component Value Date/Time   NA 139 04/29/2014 2238   K 4.3 04/29/2014 2238   CL 105 04/29/2014 2238   CO2 28 04/29/2014 2238   GLUCOSE 215* 04/29/2014 2238   BUN 11 04/29/2014 2238   CREATININE 1.26 04/29/2014 2238   CALCIUM 9.4 04/29/2014 2238   PROT 7.2 04/29/2014 2238   ALBUMIN 4.3 04/29/2014 2238   AST 17 04/29/2014 2238   ALT 11 04/29/2014 2238   ALKPHOS 91 04/29/2014 2238   BILITOT 0.8 04/29/2014 2238   GFRNONAA 72* 04/29/2014 2238   GFRAA 83* 04/29/2014 2238     Recent Labs Lab 04/29/14 2238  LIPASE 23    No results for input(s): CKTOTAL, CKMB, CKMBINDEX, TROPONINI in the last 168 hours. BNP (last 3 results) No results for input(s): BNP in the last 8760 hours.  ProBNP (last 3 results) No results for input(s): PROBNP in the last 8760 hours.   Radiological Exams on Admission: Dg Abd Acute W/chest  04/30/2014   CLINICAL DATA:  Acute onset of generalized abdominal pain and nausea. Initial encounter.  EXAM: ACUTE ABDOMEN SERIES  (ABDOMEN 2 VIEW & CHEST 1 VIEW)  COMPARISON:  Chest radiograph performed 12/30/2013, and abdominal radiograph performed 02/03/2014  FINDINGS: The lungs are well-aerated and clear. There is no evidence of focal opacification, pleural effusion or pneumothorax. The cardiomediastinal silhouette is borderline enlarged.  The visualized bowel gas pattern is unremarkable. Scattered stool and air are seen within the colon; there is no evidence of small bowel dilatation to suggest obstruction. No free intra-abdominal air is identified on the provided upright view. Clips are noted within the right upper quadrant, reflecting prior cholecystectomy.  No acute osseous abnormalities are seen; the sacroiliac joints are unremarkable in appearance.  IMPRESSION: 1. Unremarkable bowel gas pattern; no free intra-abdominal air seen. Small to moderate amount of stool noted in the colon.  2. Borderline cardiomegaly; lungs remain clear.   Electronically Signed   By: Garald Balding M.D.   On: 04/30/2014 02:56    Assessment/Plan Principal Problem:   Gastroparesis Active Problems:   Diabetic neuropathy   Epigastric pain   Essential hypertension   Noncompliance   1. Gastroparesis The patient is presenting with complaints of recurrent nausea vomiting secondary to gastroparesis. The patient is noncompliant with his medical treatment and does not follow-up regularly on clinic as well as does not follow-up for establishing assistance for medication prescription.  As compared to earlier admissions the patient does not appear to have any evidence of hypokalemia or elevated serum creatinine to suggest any significant dehydration due to GI loss. But The patient continues to have severe nausea and vomiting and has failed by mouth challenge. Patient will be admitted to observation. I would continue and resume his home medications as he mentions to me that he was fine as long as he was taking his home medications. I would not use any IV  narcotics. Patient will be given gentle IV hydration. Case manager is consulted to help with medication arrangement for the patient.  2. diabetes mellitus placing the patient on sliding scale.  3.Essential hypertension. Continuing only Lopressor as I believe clonidine will be more how full in a noncompliant patient as it can cause sudden withdrawal. Also amlodipine is associated with GI dysmotility and abdominal pain.   ConsultsCase manager dVT Prophylaxis: subcutaneous Heparin. Nutrition: Nothing by mouth   Disposition: Admitted to observation in med-surge unit.  Author: Berle Mull, MD [facility] Pager: [phone] 04/30/2014, 7:06 AM    If 7PM-7AM, please contact night-coverage [email or web address] Password [email or web address]

## 2014-05-01 DIAGNOSIS — E1143 Type 2 diabetes mellitus with diabetic autonomic (poly)neuropathy: Secondary | ICD-10-CM | POA: Diagnosis present

## 2014-05-01 DIAGNOSIS — Z91128 Patient's intentional underdosing of medication regimen for other reason: Secondary | ICD-10-CM | POA: Diagnosis present

## 2014-05-01 DIAGNOSIS — I10 Essential (primary) hypertension: Secondary | ICD-10-CM | POA: Diagnosis present

## 2014-05-01 DIAGNOSIS — E860 Dehydration: Secondary | ICD-10-CM | POA: Diagnosis present

## 2014-05-01 DIAGNOSIS — N179 Acute kidney failure, unspecified: Secondary | ICD-10-CM | POA: Diagnosis present

## 2014-05-01 DIAGNOSIS — K3184 Gastroparesis: Secondary | ICD-10-CM | POA: Diagnosis present

## 2014-05-01 DIAGNOSIS — Z794 Long term (current) use of insulin: Secondary | ICD-10-CM | POA: Diagnosis not present

## 2014-05-01 DIAGNOSIS — R112 Nausea with vomiting, unspecified: Secondary | ICD-10-CM | POA: Diagnosis present

## 2014-05-01 DIAGNOSIS — Z9114 Patient's other noncompliance with medication regimen: Secondary | ICD-10-CM | POA: Diagnosis present

## 2014-05-01 DIAGNOSIS — T383X6A Underdosing of insulin and oral hypoglycemic [antidiabetic] drugs, initial encounter: Secondary | ICD-10-CM | POA: Diagnosis present

## 2014-05-01 DIAGNOSIS — Z833 Family history of diabetes mellitus: Secondary | ICD-10-CM | POA: Diagnosis not present

## 2014-05-01 LAB — CBC
HCT: 37.4 % — ABNORMAL LOW (ref 39.0–52.0)
Hemoglobin: 12.1 g/dL — ABNORMAL LOW (ref 13.0–17.0)
MCH: 27 pg (ref 26.0–34.0)
MCHC: 32.4 g/dL (ref 30.0–36.0)
MCV: 83.5 fL (ref 78.0–100.0)
Platelets: 255 10*3/uL (ref 150–400)
RBC: 4.48 MIL/uL (ref 4.22–5.81)
RDW: 12.8 % (ref 11.5–15.5)
WBC: 9.3 10*3/uL (ref 4.0–10.5)

## 2014-05-01 LAB — BASIC METABOLIC PANEL
Anion gap: 9 (ref 5–15)
BUN: 13 mg/dL (ref 6–23)
CO2: 26 mmol/L (ref 19–32)
Calcium: 8.8 mg/dL (ref 8.4–10.5)
Chloride: 106 mmol/L (ref 96–112)
Creatinine, Ser: 1.4 mg/dL — ABNORMAL HIGH (ref 0.50–1.35)
GFR calc Af Amer: 73 mL/min — ABNORMAL LOW (ref >90)
GFR, EST NON AFRICAN AMERICAN: 63 mL/min — AB (ref >90)
Glucose, Bld: 155 mg/dL — ABNORMAL HIGH (ref 70–99)
Potassium: 3.8 mmol/L (ref 3.5–5.1)
Sodium: 141 mmol/L (ref 135–145)

## 2014-05-01 LAB — GLUCOSE, CAPILLARY
GLUCOSE-CAPILLARY: 145 mg/dL — AB (ref 70–99)
Glucose-Capillary: 107 mg/dL — ABNORMAL HIGH (ref 70–99)
Glucose-Capillary: 187 mg/dL — ABNORMAL HIGH (ref 70–99)
Glucose-Capillary: 196 mg/dL — ABNORMAL HIGH (ref 70–99)

## 2014-05-01 MED ORDER — INSULIN ASPART 100 UNIT/ML ~~LOC~~ SOLN
0.0000 [IU] | Freq: Three times a day (TID) | SUBCUTANEOUS | Status: DC
  Administered 2014-05-01 – 2014-05-02 (×3): 3 [IU] via SUBCUTANEOUS
  Administered 2014-05-02: 2 [IU] via SUBCUTANEOUS
  Administered 2014-05-03: 3 [IU] via SUBCUTANEOUS

## 2014-05-01 MED ORDER — INSULIN ASPART PROT & ASPART (70-30 MIX) 100 UNIT/ML ~~LOC~~ SUSP
4.0000 [IU] | Freq: Two times a day (BID) | SUBCUTANEOUS | Status: DC
  Administered 2014-05-01 – 2014-05-02 (×2): 4 [IU] via SUBCUTANEOUS
  Filled 2014-05-01: qty 10

## 2014-05-01 MED ORDER — GI COCKTAIL ~~LOC~~
30.0000 mL | Freq: Three times a day (TID) | ORAL | Status: DC | PRN
  Filled 2014-05-01: qty 30

## 2014-05-01 NOTE — Progress Notes (Signed)
UR completed 

## 2014-05-01 NOTE — Progress Notes (Addendum)
[facility] PROGRESS NOTE   Duane [name] [id number] DOB: 1975-06-22 DOA: 04/29/2014 PCP: [name], MD  HPI/Subjective: Feels much better today, there is slight nausea but denies any vomiting. Once try more food, less epigastric pain.  Assessment/Plan: Principal Problem:   Gastroparesis Active Problems:   Diabetic neuropathy   Acute kidney injury   Epigastric pain   Essential hypertension   Noncompliance   Gastroparesis Acute gastroparesis flareup, patient reported that he ran out of his Reglan and Phenergan at home. Reported that he is still getting some insulin, 70/30 at 8 units twice a day. Did not [facility] with the [facility]. Tolerated clear liquids very okay, wants to try more food, I will upgrade to full liquids.  Acute kidney injury Likely secondary to dehydration from nausea and vomiting. Wasn't 100 mL of fluids, increased to 125 mL/hour, check BMP in a.m.  Epigastric pain This is from recurrent vomiting, feels much better today. lipase is negative.  Toradol and GI cocktail if needed.  Diabetes mellitus with diabetic neuropathy On SSI, reported he takes 8 units of 70/30 at home, I will start half of the dose. Check hemoglobin A1c  Code Status: Full Code Family Communication: Plan discussed with the patient. Disposition Plan: Remains inpatient Diet: Diet full liquid  Consultants:  None  Procedures:  None  Antibiotics:  None   Objective: Filed Vitals:   05/01/14 0557  BP: 137/75  Pulse: 74  Temp: 98.5 F (36.9 C)  Resp: 16    Intake/Output Summary (Last 24 hours) at 05/01/14 1333 Last data filed at 05/01/14 1029  Gross per 24 hour  Intake 3403.33 ml  Output    700 ml  Net 2703.33 ml   Filed Weights   04/29/14 2245 04/30/14 1028 05/01/14 0557  Weight: 124.739 kg (275 lb) 123.5 kg (272 lb 4.3 oz) 125.5 kg (276 lb 10.8 oz)    Exam: General: Alert and awake, oriented x3, not in any acute  distress. HEENT: anicteric sclera, pupils reactive to light and accommodation, EOMI CVS: S1-S2 clear, no murmur rubs or gallops Chest: clear to auscultation bilaterally, no wheezing, rales or rhonchi Abdomen: soft nontender, nondistended, normal bowel sounds, no organomegaly Extremities: no cyanosis, clubbing or edema noted bilaterally Neuro: Cranial nerves II-XII intact, no focal neurological deficits  Data Reviewed: Basic Metabolic Panel:  Recent Labs Lab 04/29/14 2238 04/30/14 0640 05/01/14 0405  NA 139 140 141  K 4.3 5.3* 3.8  CL 105 106 106  CO2 28 26 26   GLUCOSE 215* 221* 155*  BUN 11 11 13   CREATININE 1.26 1.26 1.40*  CALCIUM 9.4 9.1 8.8   Liver Function Tests:  Recent Labs Lab 04/29/14 2238 04/30/14 0640  AST 17 25  ALT 11 18  ALKPHOS 91 77  BILITOT 0.8 0.8  PROT 7.2 7.4  ALBUMIN 4.3 4.1    Recent Labs Lab 04/29/14 2238  LIPASE 23   No results for input(s): AMMONIA in the last 168 hours. CBC:  Recent Labs Lab 04/29/14 2238 05/01/14 0405  WBC 7.3 9.3  NEUTROABS 3.8  --   HGB 13.8 12.1*  HCT 42.1 37.4*  MCV 82.5 83.5  PLT 278 255   Cardiac Enzymes: No results for input(s): CKTOTAL, CKMB, CKMBINDEX, TROPONINI in the last 168 hours. BNP (last 3 results) No results for input(s): BNP in the last 8760 hours.  ProBNP (last 3 results) No results for input(s): PROBNP in the last 8760 hours.  CBG:  Recent Labs Lab 04/30/14 1317 04/30/14 1924  04/30/14 2349 05/01/14 0559 05/01/14 1140  GLUCAP 200* 165* 168* 145* 187*    Micro No results found for this or any previous visit (from the past 240 hour(s)).   Studies: Dg Abd Acute W/chest  04/30/2014   CLINICAL DATA:  Acute onset of generalized abdominal pain and nausea. Initial encounter.  EXAM: ACUTE ABDOMEN SERIES (ABDOMEN 2 VIEW & CHEST 1 VIEW)  COMPARISON:  Chest radiograph performed 12/30/2013, and abdominal radiograph performed 02/03/2014  FINDINGS: The lungs are well-aerated and clear.  There is no evidence of focal opacification, pleural effusion or pneumothorax. The cardiomediastinal silhouette is borderline enlarged.  The visualized bowel gas pattern is unremarkable. Scattered stool and air are seen within the colon; there is no evidence of small bowel dilatation to suggest obstruction. No free intra-abdominal air is identified on the provided upright view. Clips are noted within the right upper quadrant, reflecting prior cholecystectomy.  No acute osseous abnormalities are seen; the sacroiliac joints are unremarkable in appearance.  IMPRESSION: 1. Unremarkable bowel gas pattern; no free intra-abdominal air seen. Small to moderate amount of stool noted in the colon. 2. Borderline cardiomegaly; lungs remain clear.   Electronically Signed   By: Garald Balding M.D.   On: 04/30/2014 02:56    Scheduled Meds: . amLODipine  10 mg Oral q morning - 10a  . gabapentin  300 mg Oral QHS  . heparin  5,000 Units Subcutaneous 3 times per day  . insulin aspart  0-15 Units Subcutaneous Q6H  . metoCLOPramide (REGLAN) injection  10 mg Intravenous 4 times per day  . metoprolol tartrate  50 mg Oral BID  . pantoprazole  40 mg Oral BID  . sucralfate  1 g Oral TID WC & HS   Continuous Infusions: . sodium chloride 125 mL/hr at 05/01/14 0857       Time spent: 35 minutes    [facility] A  [facility] Pager [phone] If 7PM-7AM, please contact night-coverage at [email or web address], password [facility] 05/01/2014, 1:33 PM  LOS: 1 day

## 2014-05-02 DIAGNOSIS — N179 Acute kidney failure, unspecified: Secondary | ICD-10-CM

## 2014-05-02 LAB — CBC
HCT: 33.9 % — ABNORMAL LOW (ref 39.0–52.0)
Hemoglobin: 11.6 g/dL — ABNORMAL LOW (ref 13.0–17.0)
MCH: 27.9 pg (ref 26.0–34.0)
MCHC: 34.2 g/dL (ref 30.0–36.0)
MCV: 81.5 fL (ref 78.0–100.0)
Platelets: 225 10*3/uL (ref 150–400)
RBC: 4.16 MIL/uL — AB (ref 4.22–5.81)
RDW: 12.6 % (ref 11.5–15.5)
WBC: 6 10*3/uL (ref 4.0–10.5)

## 2014-05-02 LAB — COMPREHENSIVE METABOLIC PANEL
ALK PHOS: 61 U/L (ref 39–117)
ALT: 13 U/L (ref 0–53)
ANION GAP: 8 (ref 5–15)
AST: 15 U/L (ref 0–37)
Albumin: 3.2 g/dL — ABNORMAL LOW (ref 3.5–5.2)
BUN: 13 mg/dL (ref 6–23)
CHLORIDE: 110 mmol/L (ref 96–112)
CO2: 22 mmol/L (ref 19–32)
Calcium: 8.3 mg/dL — ABNORMAL LOW (ref 8.4–10.5)
Creatinine, Ser: 1.18 mg/dL (ref 0.50–1.35)
GFR calc Af Amer: 90 mL/min — ABNORMAL LOW (ref >90)
GFR, EST NON AFRICAN AMERICAN: 77 mL/min — AB (ref >90)
Glucose, Bld: 124 mg/dL — ABNORMAL HIGH (ref 70–99)
Potassium: 4.1 mmol/L (ref 3.5–5.1)
Sodium: 140 mmol/L (ref 135–145)
Total Bilirubin: 0.8 mg/dL (ref 0.3–1.2)
Total Protein: 5.9 g/dL — ABNORMAL LOW (ref 6.0–8.3)

## 2014-05-02 LAB — GLUCOSE, CAPILLARY
GLUCOSE-CAPILLARY: 161 mg/dL — AB (ref 70–99)
Glucose-Capillary: 106 mg/dL — ABNORMAL HIGH (ref 70–99)
Glucose-Capillary: 128 mg/dL — ABNORMAL HIGH (ref 70–99)
Glucose-Capillary: 162 mg/dL — ABNORMAL HIGH (ref 70–99)
Glucose-Capillary: 197 mg/dL — ABNORMAL HIGH (ref 70–99)

## 2014-05-02 MED ORDER — INSULIN ASPART PROT & ASPART (70-30 MIX) 100 UNIT/ML ~~LOC~~ SUSP
8.0000 [IU] | Freq: Two times a day (BID) | SUBCUTANEOUS | Status: DC
  Administered 2014-05-02 – 2014-05-03 (×2): 8 [IU] via SUBCUTANEOUS
  Filled 2014-05-02: qty 10

## 2014-05-02 MED ORDER — AMLODIPINE BESYLATE 5 MG PO TABS
5.0000 mg | ORAL_TABLET | Freq: Every morning | ORAL | Status: DC
  Administered 2014-05-03: 5 mg via ORAL
  Filled 2014-05-02: qty 1

## 2014-05-02 NOTE — Plan of Care (Signed)
Problem: Phase II Progression Outcomes Goal: Progress activity as tolerated unless otherwise ordered Outcome: Completed/Met Date Met:  05/02/14 Ambulated in hallway

## 2014-05-02 NOTE — Progress Notes (Signed)
[facility] PROGRESS NOTE   Duane [name] [id number] DOB: 02/08/1976 DOA: 04/29/2014 PCP: [name], MD  HPI/Subjective: Continues to improve still has nausea but no vomiting, advance diet to carb modified diet. Might be able to be discharge in a.m. if continues to improve.  Assessment/Plan: Principal Problem:   Gastroparesis Active Problems:   Diabetic neuropathy   Acute kidney injury   Epigastric pain   Essential hypertension   Noncompliance   Gastroparesis Acute gastroparesis flareup, patient reported that he ran out of his Reglan and Phenergan at home. Reported that he is still getting some insulin, 70/30 at 8 units twice a day. Did not follow-[facility] with the [facility]. Tolerated full liquids, advance diet to carb modified solid diet.  Acute kidney injury Likely secondary to dehydration from nausea and vomiting. Creatinine down from 1.4 to 1.8 after aggressive IV fluid hydration. Slow the rate down to 75 mL/h.  Epigastric pain This is from recurrent vomiting, feels much better today. lipase is negative.  Toradol and GI cocktail if needed.  Diabetes mellitus with diabetic neuropathy On SSI, restart 70/30 at 8 units Check hemoglobin A1c  Code Status: Full Code Family Communication: Plan discussed with the patient. Disposition Plan: Remains inpatient Diet: Diet Carb Modified  Consultants:  None  Procedures:  None  Antibiotics:  None   Objective: Filed Vitals:   05/02/14 0558  BP: 145/83  Pulse: 64  Temp: 97.7 F (36.5 C)  Resp: 17    Intake/Output Summary (Last 24 hours) at 05/02/14 1434 Last data filed at 05/02/14 1318  Gross per 24 hour  Intake 2693.75 ml  Output      0 ml  Net 2693.75 ml   Filed Weights   04/30/14 1028 05/01/14 0557 05/02/14 0558  Weight: 123.5 kg (272 lb 4.3 oz) 125.5 kg (276 lb 10.8 oz) 128.1 kg (282 lb 6.6 oz)    Exam: General: Alert and awake, oriented x3, not in any acute  distress. HEENT: anicteric sclera, pupils reactive to light and accommodation, EOMI CVS: S1-S2 clear, no murmur rubs or gallops Chest: clear to auscultation bilaterally, no wheezing, rales or rhonchi Abdomen: soft nontender, nondistended, normal bowel sounds, no organomegaly Extremities: no cyanosis, clubbing or edema noted bilaterally Neuro: Cranial nerves II-XII intact, no focal neurological deficits  Data Reviewed: Basic Metabolic Panel:  Recent Labs Lab 04/29/14 2238 04/30/14 0640 05/01/14 0405 05/02/14 0450  NA 139 140 141 140  K 4.3 5.3* 3.8 4.1  CL 105 106 106 110  CO2 28 26 26 22   GLUCOSE 215* 221* 155* 124*  BUN 11 11 13 13   CREATININE 1.26 1.26 1.40* 1.18  CALCIUM 9.4 9.1 8.8 8.3*   Liver Function Tests:  Recent Labs Lab 04/29/14 2238 04/30/14 0640 05/02/14 0450  AST 17 25 15   ALT 11 18 13   ALKPHOS 91 77 61  BILITOT 0.8 0.8 0.8  PROT 7.2 7.4 5.9*  ALBUMIN 4.3 4.1 3.2*    Recent Labs Lab 04/29/14 2238  LIPASE 23   No results for input(s): AMMONIA in the last 168 hours. CBC:  Recent Labs Lab 04/29/14 2238 05/01/14 0405 05/02/14 0450  WBC 7.3 9.3 6.0  NEUTROABS 3.8  --   --   HGB 13.8 12.1* 11.6*  HCT 42.1 37.4* 33.9*  MCV 82.5 83.5 81.5  PLT 278 255 225   Cardiac Enzymes: No results for input(s): CKTOTAL, CKMB, CKMBINDEX, TROPONINI in the last 168 hours. BNP (last 3 results) No results for input(s): BNP in the  last 8760 hours.  ProBNP (last 3 results) No results for input(s): PROBNP in the last 8760 hours.  CBG:  Recent Labs Lab 05/01/14 1837 05/01/14 2342 05/02/14 0557 05/02/14 0830 05/02/14 1151  GLUCAP 196* 107* 106* 128* 162*    Micro No results found for this or any previous visit (from the past 240 hour(s)).   Studies: No results found.  Scheduled Meds: . amLODipine  10 mg Oral q morning - 10a  . gabapentin  300 mg Oral QHS  . heparin  5,000 Units Subcutaneous 3 times per day  . insulin aspart  0-15 Units  Subcutaneous TID WC  . insulin aspart protamine- aspart  4 Units Subcutaneous BID WC  . metoCLOPramide (REGLAN) injection  10 mg Intravenous 4 times per day  . metoprolol tartrate  50 mg Oral BID  . pantoprazole  40 mg Oral BID  . sucralfate  1 g Oral TID WC & HS   Continuous Infusions: . sodium chloride 125 mL/hr at 05/02/14 1119       Time spent: 35 minutes    [facility] A  [facility] Pager [phone] If 7PM-7AM, please contact night-coverage at [email or web address], password [facility] 05/02/2014, 2:34 PM  LOS: 2 days

## 2014-05-03 LAB — GLUCOSE, CAPILLARY: Glucose-Capillary: 164 mg/dL — ABNORMAL HIGH (ref 70–99)

## 2014-05-03 MED ORDER — SUCRALFATE 1 G PO TABS: 1.0000 g | ORAL_TABLET | Freq: Three times a day (TID) | ORAL | Status: DC

## 2014-05-03 MED ORDER — METOPROLOL TARTRATE 50 MG PO TABS
50.0000 mg | ORAL_TABLET | Freq: Two times a day (BID) | ORAL | Status: DC
Start: 2014-05-03 — End: 2014-05-07

## 2014-05-03 MED ORDER — INSULIN GLARGINE 100 UNIT/ML ~~LOC~~ SOLN: 32.0000 [IU] | Freq: Every day | SUBCUTANEOUS | Status: DC

## 2014-05-03 MED ORDER — METOCLOPRAMIDE HCL 10 MG PO TABS: 10.0000 mg | ORAL_TABLET | Freq: Three times a day (TID) | ORAL | Status: DC | PRN

## 2014-05-03 MED ORDER — AMLODIPINE BESYLATE 10 MG PO TABS
10.0000 mg | ORAL_TABLET | Freq: Every morning | ORAL | Status: DC
Start: 2014-05-03 — End: 2014-07-07

## 2014-05-03 MED ORDER — PANTOPRAZOLE SODIUM 40 MG PO TBEC
40.0000 mg | DELAYED_RELEASE_TABLET | Freq: Two times a day (BID) | ORAL | Status: DC
Start: 2014-05-03 — End: 2014-05-07

## 2014-05-03 MED ORDER — INSULIN ASPART 100 UNIT/ML CARTRIDGE (PENFILL): 8.0000 [IU] | Freq: Three times a day (TID) | SUBCUTANEOUS | Status: DC

## 2014-05-03 MED ORDER — GABAPENTIN 300 MG PO CAPS: 300.0000 mg | ORAL_CAPSULE | Freq: Every day | ORAL | Status: DC

## 2014-05-03 NOTE — Discharge Instructions (Signed)
[facility] health and [facility] appointment  May 05, 2014 at [place] [address] [place], [place] , [place] Phone [phone]

## 2014-05-03 NOTE — Progress Notes (Signed)
D/c to home with friends.  Has prescriptions and d/c instructions and demonstrated understanding.  VSS pain denied breathing regular and unlabored

## 2014-05-03 NOTE — Discharge Summary (Signed)
Physician Discharge Summary  Duane [name] [id number] DOB: 09-06-1975 DOA: 04/29/2014  PCP: [name], MD  Admit date: 04/29/2014 Discharge date: 05/03/2014  Time spent: 40 minutes  Recommendations for Outpatient Follow-up:  1. Follow-up with the [facility] in 2 days on 05/05/2014 at 9:45 AM. 2. Patient will needs help with his insulin regimen, discharge on Lantus and NovoLog insulin.  Discharge Diagnoses:  Principal Problem:   Gastroparesis Active Problems:   Diabetic neuropathy   Acute kidney injury   Epigastric pain   Essential hypertension   Noncompliance   Discharge Condition: Stable  Diet recommendation: Carb modified diet  Filed Weights   05/01/14 0557 05/02/14 0558 05/03/14 0556  Weight: 125.5 kg (276 lb 10.8 oz) 128.1 kg (282 lb 6.6 oz) 127.065 kg (280 lb 2 oz)    History of present illness:  Duane [name] is a 39 y.o. male with Past medical history of diabetes mellitus, hypertension, gastroparesis, neuropathy, obesity. The patient is presenting with complaints of recurrent abdominal pain. Patient mentions that he ran out of his diabetes medication again 3 weeks ago and has not been taking them. Patient also mentions that he has [facility] but has not seen any physician there. Patient mentions he does not have any insurance and has not also gone to the [facility] card appointment. With this the patient presents with complaints of 3 days ounces of nausea vomiting as well as epigastric pain. This typical of his gastroparesis flareup. He denies any fever or chills denies any diarrhea last bowel movement was this morning. Denies any burning urination. As some leg swelling. No new allergies. Denies any drug abuse. The patient is coming from home. And at his baseline independent for most of his ADL.   Hospital Course:   Gastroparesis Acute gastroparesis flareup, patient reported that he ran out of his Reglan and Phenergan  at home. Did not follow-up with the [facility]. Treated symptomatically with antiemetics and IV fluids. Initially was kept nothing by mouth, diet advanced to clears and then full liquids then regular diet. Patient tolerated solid carbohydrate modified diet okay and discharged home.  Acute kidney injury Likely secondary to dehydration from nausea and vomiting. Creatinine down from 1.4 to 1.1 after aggressive IV fluid hydration. Started on lisinopril  Epigastric pain This is from recurrent vomiting, feels much better today. lipase is negative.  Toradol and GI cocktail if needed helped while in the hospital, no epigastric pain on discharge.  Diabetes mellitus with diabetic neuropathy Treated with SSRI and 8 units of 70/30 at home. A1c is pending at the time of discharge. Patient reported that he has to pens of Lantus insulin in 2 pens of NovoLog insulin at home. Currently does not have health insurance, scheduled to see the health [facility] Center in 2 days. In the interim history continues his Lantus/NovoLog insulin he got at home. Hopefully the [facility] clinic will help him with his insulin regimen, if not patient need prescription for 70/30. He reported that he uses 32 units of Lantus at night.   Procedures:  None  Consultations:  None  Discharge Exam: Filed Vitals:   05/03/14 0556  BP: 134/84  Pulse: 66  Temp: 98 F (36.7 C)  Resp: 17   General: Alert and awake, oriented x3, not in any acute distress. HEENT: anicteric sclera, pupils reactive to light and accommodation, EOMI CVS: S1-S2 clear, no murmur rubs or gallops Chest: clear to auscultation bilaterally, no  wheezing, rales or rhonchi Abdomen: soft nontender, nondistended, normal bowel sounds, no organomegaly Extremities: no cyanosis, clubbing or edema noted bilaterally Neuro: Cranial nerves II-XII intact, no focal neurological deficits  Discharge Instructions   Discharge  Instructions    Diet Carb Modified    Complete by:  As directed      Increase activity slowly    Complete by:  As directed           Current Discharge Medication List    START taking these medications   Details  insulin aspart (NOVOLOG) cartridge Inject 8 Units into the skin 3 (three) times daily with meals. Qty: 15 mL, Refills: 11    insulin glargine (LANTUS) 100 UNIT/ML injection Inject 0.32 mLs (32 Units total) into the skin at bedtime. Qty: 10 mL, Refills: 11      CONTINUE these medications which have CHANGED   Details  amLODipine (NORVASC) 10 MG tablet Take 1 tablet (10 mg total) by mouth every morning. Qty: 30 tablet, Refills: 3    gabapentin (NEURONTIN) 300 MG capsule Take 1 capsule (300 mg total) by mouth at bedtime. Qty: 30 capsule, Refills: 0    metoCLOPramide (REGLAN) 10 MG tablet Take 1 tablet (10 mg total) by mouth every 8 (eight) hours as needed for nausea. Qty: 90 tablet, Refills: 0    metoprolol (LOPRESSOR) 50 MG tablet Take 1 tablet (50 mg total) by mouth 2 (two) times daily. Qty: 60 tablet, Refills: 0    pantoprazole (PROTONIX) 40 MG tablet Take 1 tablet (40 mg total) by mouth 2 (two) times daily. Qty: 60 tablet, Refills: 0    sucralfate (CARAFATE) 1 G tablet Take 1 tablet (1 g total) by mouth 3 (three) times daily with meals. Qty: 90 tablet, Refills: 1      CONTINUE these medications which have NOT CHANGED   Details  acetaminophen (TYLENOL) 500 MG tablet Take 500 mg by mouth every 6 (six) hours as needed for mild pain.     promethazine (PHENERGAN) 25 MG tablet Take 1 tablet (25 mg total) by mouth every 8 (eight) hours as needed for nausea or vomiting. Qty: 60 tablet, Refills: 0    glucose blood (ONE TOUCH ULTRA TEST) test strip Use as instructed to check blood sugar once a day dx code 250.00 Qty: 100 each, Refills: 0      STOP taking these medications     cloNIDine (CATAPRES) 0.1 MG tablet      HYDROcodone-acetaminophen (NORCO/VICODIN) 5-325  MG per tablet      Insulin Aspart Prot & Aspart (NOVOLOG 70/30 MIX) (70-30) 100 UNIT/ML Pen        No Known Allergies Follow-up Information    Follow up with [facility]    . Go in 2 days.   Why:  May 05, 2014 at 0945 am    Contact information:   [address] [place] [phone]       The results of significant diagnostics from this hospitalization (including imaging, microbiology, ancillary and laboratory) are listed below for reference.    Significant Diagnostic Studies: Dg Abd Acute W/chest  04/30/2014   CLINICAL DATA:  Acute onset of generalized abdominal pain and nausea. Initial encounter.  EXAM: ACUTE ABDOMEN SERIES (ABDOMEN 2 VIEW & CHEST 1 VIEW)  COMPARISON:  Chest radiograph performed 12/30/2013, and abdominal radiograph performed 02/03/2014  FINDINGS: The lungs are well-aerated and clear. There is no evidence of focal opacification, pleural effusion or pneumothorax. The  cardiomediastinal silhouette is borderline enlarged.  The visualized bowel gas pattern is unremarkable. Scattered stool and air are seen within the colon; there is no evidence of small bowel dilatation to suggest obstruction. No free intra-abdominal air is identified on the provided upright view. Clips are noted within the right upper quadrant, reflecting prior cholecystectomy.  No acute osseous abnormalities are seen; the sacroiliac joints are unremarkable in appearance.  IMPRESSION: 1. Unremarkable bowel gas pattern; no free intra-abdominal air seen. Small to moderate amount of stool noted in the colon. 2. Borderline cardiomegaly; lungs remain clear.   Electronically Signed   By: Garald Balding M.D.   On: 04/30/2014 02:56    Microbiology: No results found for this or any previous visit (from the past 240 hour(s)).   Labs: Basic Metabolic Panel:  Recent Labs Lab 04/29/14 2238 04/30/14 0640 05/01/14 0405 05/02/14 0450  NA 139 140 141 140   K 4.3 5.3* 3.8 4.1  CL 105 106 106 110  CO2 28 26 26 22   GLUCOSE 215* 221* 155* 124*  BUN 11 11 13 13   CREATININE 1.26 1.26 1.40* 1.18  CALCIUM 9.4 9.1 8.8 8.3*   Liver Function Tests:  Recent Labs Lab 04/29/14 2238 04/30/14 0640 05/02/14 0450  AST 17 25 15   ALT 11 18 13   ALKPHOS 91 77 61  BILITOT 0.8 0.8 0.8  PROT 7.2 7.4 5.9*  ALBUMIN 4.3 4.1 3.2*    Recent Labs Lab 04/29/14 2238  LIPASE 23   No results for input(s): AMMONIA in the last 168 hours. CBC:  Recent Labs Lab 04/29/14 2238 05/01/14 0405 05/02/14 0450  WBC 7.3 9.3 6.0  NEUTROABS 3.8  --   --   HGB 13.8 12.1* 11.6*  HCT 42.1 37.4* 33.9*  MCV 82.5 83.5 81.5  PLT 278 255 225   Cardiac Enzymes: No results for input(s): CKTOTAL, CKMB, CKMBINDEX, TROPONINI in the last 168 hours. BNP: BNP (last 3 results) No results for input(s): BNP in the last 8760 hours.  ProBNP (last 3 results) No results for input(s): PROBNP in the last 8760 hours.  CBG:  Recent Labs Lab 05/02/14 0830 05/02/14 1151 05/02/14 1720 05/02/14 2217 05/03/14 0802  GLUCAP 128* 162* 161* 197* 164*       Signed:  Madysun Thall A  [facility] 05/03/2014, 11:07 AM

## 2014-05-03 NOTE — Care Management Note (Signed)
  Page 1 of 1   05/03/2014     9:58:09 AM CARE MANAGEMENT NOTE 05/03/2014  Patient:  TRACKER, MULLINGS   Account Number:  [id number]  Date Initiated:  05/03/2014  Documentation initiated by:  Magdalen Spatz  Subjective/Objective Assessment:     Action/Plan:   Anticipated DC Date:  05/03/2014   Anticipated DC Plan:  [facility]  CM consult  Medication Assistance      Choice offered to / List presented to:             Status of service:   Medicare Important Message given?   (If response is "NO", the following Medicare IM given date fields will be blank) Date Medicare IM given:   Medicare IM given by:   Date Additional Medicare IM given:   Additional Medicare IM given by:    Discharge Disposition:  HOME/SELF CARE  Per UR Regulation:  Reviewed for med. necessity/level of care/duration of stay  If discussed at [place] of Stay Meetings, dates discussed:    Comments:  05-03-14 Discussed [place] letter with patient . Patient just used [facility] Dec 2015 to Jan 2016 , entered a over ride for patient to use again ( only once per calender year ) . Explained same to patient .  Patient had an appointment at [place] but did not go stated he "was too sick to go to the appoinment " .  Scheduled an appointment for May 05, 2014 at [place] . Patient has address and phone number in case he has to reschedule. Patient aware [facility] also has a pharmacy and may be able to provide him further medication assistance.   Magdalen Spatz RN BSN [phone]

## 2014-05-04 ENCOUNTER — Encounter (HOSPITAL_COMMUNITY): Payer: Self-pay | Admitting: Emergency Medicine

## 2014-05-04 ENCOUNTER — Inpatient Hospital Stay (HOSPITAL_COMMUNITY)
Admission: EM | Admit: 2014-05-04 | Discharge: 2014-05-06 | DRG: 074 | Disposition: A | Discharge status: Home / Self Care | Payer: Medicaid Other | Attending: Internal Medicine | Admitting: Internal Medicine

## 2014-05-04 DIAGNOSIS — K3184 Gastroparesis: Secondary | ICD-10-CM | POA: Diagnosis present

## 2014-05-04 DIAGNOSIS — E860 Dehydration: Secondary | ICD-10-CM | POA: Diagnosis present

## 2014-05-04 DIAGNOSIS — E1043 Type 1 diabetes mellitus with diabetic autonomic (poly)neuropathy: Principal | ICD-10-CM | POA: Diagnosis present

## 2014-05-04 DIAGNOSIS — Z794 Long term (current) use of insulin: Secondary | ICD-10-CM

## 2014-05-04 DIAGNOSIS — E1065 Type 1 diabetes mellitus with hyperglycemia: Secondary | ICD-10-CM | POA: Diagnosis present

## 2014-05-04 DIAGNOSIS — E1143 Type 2 diabetes mellitus with diabetic autonomic (poly)neuropathy: Secondary | ICD-10-CM

## 2014-05-04 DIAGNOSIS — E1140 Type 2 diabetes mellitus with diabetic neuropathy, unspecified: Secondary | ICD-10-CM | POA: Diagnosis present

## 2014-05-04 DIAGNOSIS — Z79899 Other long term (current) drug therapy: Secondary | ICD-10-CM

## 2014-05-04 DIAGNOSIS — Z6833 Body mass index (BMI) 33.0-33.9, adult: Secondary | ICD-10-CM

## 2014-05-04 DIAGNOSIS — I10 Essential (primary) hypertension: Secondary | ICD-10-CM | POA: Diagnosis present

## 2014-05-04 DIAGNOSIS — R1115 Cyclical vomiting syndrome unrelated to migraine: Secondary | ICD-10-CM

## 2014-05-04 DIAGNOSIS — K219 Gastro-esophageal reflux disease without esophagitis: Secondary | ICD-10-CM | POA: Diagnosis present

## 2014-05-04 DIAGNOSIS — Z9114 Patient's other noncompliance with medication regimen: Secondary | ICD-10-CM | POA: Diagnosis present

## 2014-05-04 DIAGNOSIS — E108 Type 1 diabetes mellitus with unspecified complications: Secondary | ICD-10-CM

## 2014-05-04 DIAGNOSIS — IMO0002 Reserved for concepts with insufficient information to code with codable children: Secondary | ICD-10-CM | POA: Diagnosis present

## 2014-05-04 DIAGNOSIS — E669 Obesity, unspecified: Secondary | ICD-10-CM | POA: Diagnosis present

## 2014-05-04 DIAGNOSIS — R112 Nausea with vomiting, unspecified: Secondary | ICD-10-CM | POA: Diagnosis present

## 2014-05-04 HISTORY — DX: Gastro-esophageal reflux disease without esophagitis: K21.9

## 2014-05-04 LAB — CBC WITH DIFFERENTIAL/PLATELET
BASOS ABS: 0 10*3/uL (ref 0.0–0.1)
BASOS PCT: 0 % (ref 0–1)
Eosinophils Absolute: 0.1 10*3/uL (ref 0.0–0.7)
Eosinophils Relative: 1 % (ref 0–5)
HCT: 41.1 % (ref 39.0–52.0)
Hemoglobin: 14.1 g/dL (ref 13.0–17.0)
Lymphocytes Relative: 33 % (ref 12–46)
Lymphs Abs: 2.1 10*3/uL (ref 0.7–4.0)
MCH: 27.4 pg (ref 26.0–34.0)
MCHC: 34.3 g/dL (ref 30.0–36.0)
MCV: 80 fL (ref 78.0–100.0)
MONO ABS: 0.5 10*3/uL (ref 0.1–1.0)
Monocytes Relative: 7 % (ref 3–12)
NEUTROS ABS: 3.8 10*3/uL (ref 1.7–7.7)
Neutrophils Relative %: 59 % (ref 43–77)
PLATELETS: 253 10*3/uL (ref 150–400)
RBC: 5.14 MIL/uL (ref 4.22–5.81)
RDW: 12.2 % (ref 11.5–15.5)
WBC: 6.4 10*3/uL (ref 4.0–10.5)

## 2014-05-04 LAB — HEMOGLOBIN A1C
HEMOGLOBIN A1C: 9.2 % — AB (ref 4.8–5.6)
Mean Plasma Glucose: 217 mg/dL

## 2014-05-04 MED ORDER — PROMETHAZINE HCL 25 MG/ML IJ SOLN
25.0000 mg | Freq: Once | INTRAMUSCULAR | Status: AC
  Administered 2014-05-04: 25 mg via INTRAMUSCULAR
  Filled 2014-05-04: qty 1

## 2014-05-04 MED ORDER — FENTANYL CITRATE 0.05 MG/ML IJ SOLN
100.0000 ug | Freq: Once | INTRAMUSCULAR | Status: AC
  Administered 2014-05-04: 100 ug via INTRAVENOUS
  Filled 2014-05-04: qty 2

## 2014-05-04 MED ORDER — ONDANSETRON 4 MG PO TBDP
8.0000 mg | ORAL_TABLET | Freq: Once | ORAL | Status: AC
  Administered 2014-05-04: 8 mg via ORAL

## 2014-05-04 MED ORDER — METOCLOPRAMIDE HCL 5 MG/ML IJ SOLN
10.0000 mg | Freq: Once | INTRAMUSCULAR | Status: AC
  Administered 2014-05-04: 10 mg via INTRAVENOUS
  Filled 2014-05-04: qty 2

## 2014-05-04 MED ORDER — ONDANSETRON 4 MG PO TBDP
ORAL_TABLET | ORAL | Status: AC
  Filled 2014-05-04: qty 2

## 2014-05-04 MED ORDER — DICYCLOMINE HCL 10 MG/ML IM SOLN
20.0000 mg | Freq: Once | INTRAMUSCULAR | Status: AC
  Administered 2014-05-04: 20 mg via INTRAMUSCULAR
  Filled 2014-05-04: qty 2

## 2014-05-04 MED ORDER — SODIUM CHLORIDE 0.9 % IV BOLUS (SEPSIS)
1000.0000 mL | Freq: Once | INTRAVENOUS | Status: AC
  Administered 2014-05-04: 1000 mL via INTRAVENOUS

## 2014-05-04 MED ORDER — PROMETHAZINE HCL 25 MG RE SUPP
25.0000 mg | Freq: Once | RECTAL | Status: DC
  Filled 2014-05-04: qty 1

## 2014-05-04 MED ORDER — DIPHENHYDRAMINE HCL 50 MG/ML IJ SOLN
25.0000 mg | Freq: Once | INTRAMUSCULAR | Status: AC
  Administered 2014-05-04: 25 mg via INTRAVENOUS
  Filled 2014-05-04: qty 1

## 2014-05-04 NOTE — ED Notes (Signed)
Phlebotomist attempted blood draw x2 without success.

## 2014-05-04 NOTE — ED Provider Notes (Signed)
CSN: [id number]     Arrival date & time 05/04/14  2201 History  This chart was scribed for Duane Flemings, MD by [name], ED Scribe. This patient was seen in room A07C/A07C and the patient's care was started at 11:33 PM.   Chief Complaint  Patient presents with  . Emesis  . Abdominal Pain    The history is provided by the patient. No language interpreter was used.     HPI Comments: Emiterio [name] is a 39 y.o. male, with history of HTN, DM, gastritis, and gastroparesis, who presents to the Emergency Department complaining of constant, 10/10, mid to upper abdominal pain with intermittent vomiting that began earlier today. Patient was recently discharged yesterday for the same, and reports he was doing "fine". However, he reports he is noncompliant with his medications. Patient has past surgical history of cholecystectomy, in addition to 2 endoscopies performed at [facility] approximately 1 year ago in April 2015. He states he never received a stimulator at [facility], and has been unable to f/u due to lack of insurance.   Past Medical History  Diagnosis Date  . Hypertension   . Diabetes mellitus without complication age 53    insulin requiring from the start  . Gastritis   . Gastroparesis 2012    100% gastric retention on 08/2013 GES  . Obesity   . Diabetic neuropathy    Past Surgical History  Procedure Laterality Date  . Cholecystectomy  2013  . Knee surgery Left   . Flexible sigmoidoscopy N/A 06/11/2013    Procedure: FLEXIBLE SIGMOIDOSCOPY;  Surgeon: [name], MD;  Location: [facility];  Service: Endoscopy;  Laterality: N/A;  . Esophagogastroduodenoscopy N/A 06/11/2013    Procedure: ESOPHAGOGASTRODUODENOSCOPY (EGD);  Surgeon: [name], MD;  Location: [facility];  Service: Endoscopy;  Laterality: N/A;   Family History  Problem Relation Age of Onset  . Diabetes Father    History  Substance Use Topics  . Smoking status: Never Smoker   . Smokeless tobacco: Never Used  .  Alcohol Use: Yes     Comment: "none really" - 08/27/13    Review of Systems  Gastrointestinal: Positive for vomiting and abdominal pain.  All other systems reviewed and are negative.     Allergies  Review of patient's allergies indicates no known allergies.  Home Medications   Prior to Admission medications   Medication Sig Start Date End Date Taking? Authorizing Provider  acetaminophen (TYLENOL) 500 MG tablet Take 500 mg by mouth every 6 (six) hours as needed for mild pain.     Historical Provider, MD  amLODipine (NORVASC) 10 MG tablet Take 1 tablet (10 mg total) by mouth every morning. 05/03/14   [name], MD  gabapentin (NEURONTIN) 300 MG capsule Take 1 capsule (300 mg total) by mouth at bedtime. 05/03/14   [name], MD  glucose blood (ONE TOUCH ULTRA TEST) test strip Use as instructed to check blood sugar once a day dx code 250.00 02/05/14   [name], MD  insulin aspart (NOVOLOG) cartridge Inject 8 Units into the skin 3 (three) times daily with meals. 05/03/14   [name], MD  insulin glargine (LANTUS) 100 UNIT/ML injection Inject 0.32 mLs (32 Units total) into the skin at bedtime. 05/03/14   [name], MD  metoCLOPramide (REGLAN) 10 MG tablet Take 1 tablet (10 mg total) by mouth every 8 (eight) hours as needed for nausea. 05/03/14   [name], MD  metoprolol (LOPRESSOR) 50 MG tablet Take 1 tablet (  50 mg total) by mouth 2 (two) times daily. 05/03/14   [name], MD  pantoprazole (PROTONIX) 40 MG tablet Take 1 tablet (40 mg total) by mouth 2 (two) times daily. 05/03/14   [name], MD  promethazine (PHENERGAN) 25 MG tablet Take 1 tablet (25 mg total) by mouth every 8 (eight) hours as needed for nausea or vomiting. 01/10/14   [name], MD  sucralfate (CARAFATE) 1 G tablet Take 1 tablet (1 g total) by mouth 3 (three) times daily with meals. 05/03/14   [name], MD   Triage Vitals: BP 190/105 mmHg  Pulse 97  Temp(Src) 98.9 F (37.2 C) (Oral)  Resp  20  Wt 280 lb (127.007 kg)  SpO2 100%  Physical Exam  Constitutional: He is oriented to person, place, and time. He appears well-developed and well-nourished. He appears distressed.  HENT:  Head: Normocephalic and atraumatic.  Nose: Nose normal.  Mouth/Throat: Oropharynx is clear and moist.  Eyes: Conjunctivae and EOM are normal. Pupils are equal, round, and reactive to light.  Neck: Normal range of motion. Neck supple. No JVD present. No tracheal deviation present. No thyromegaly present.  Cardiovascular: Normal rate, regular rhythm, normal heart sounds and intact distal pulses.  Exam reveals no gallop and no friction rub.   No murmur heard. Pulmonary/Chest: Effort normal and breath sounds normal. No stridor. No respiratory distress. He has no wheezes. He has no rales. He exhibits no tenderness.  Abdominal: Soft. Bowel sounds are normal. He exhibits no distension and no mass. There is tenderness. There is no rebound and no guarding.  Diffuse upper tenderness.  Musculoskeletal: Normal range of motion. He exhibits no edema or tenderness.  Lymphadenopathy:    He has no cervical adenopathy.  Neurological: He is alert and oriented to person, place, and time. He displays normal reflexes. He exhibits normal muscle tone. Coordination normal.  Skin: Skin is warm. No rash noted. He is diaphoretic. No erythema. No pallor.  Psychiatric: He has a normal mood and affect. His behavior is normal. Judgment and thought content normal.  Nursing note and vitals reviewed.   ED Course  Procedures (including critical care time)  DIAGNOSTIC STUDIES: Oxygen Saturation is 100% on room air, normal by my interpretation.    COORDINATION OF CARE: At 2337 Discussed treatment plan with patient which includes pain medication, antiemetic. Patient agrees.   Labs Review Labs Reviewed  COMPREHENSIVE METABOLIC PANEL - Abnormal; Notable for the following:    Glucose, Bld 176 (*)    GFR calc non Af Amer 70 (*)     GFR calc Af Amer 81 (*)    All other components within normal limits  CBG MONITORING, ED - Abnormal; Notable for the following:    Glucose-Capillary 218 (*)    All other components within normal limits  CBC WITH DIFFERENTIAL/PLATELET  LIPASE, BLOOD    Imaging Review No results found.   EKG Interpretation None      MDM   Final diagnoses:  Intractable cyclical vomiting with nausea  Diabetic gastroparesis    39 year old male history of poorly controlled diabetes, gastroparesis, frequent visits for nausea and vomiting that is intractable in nature.  Patient was discharged yesterday, reports he did not have time to pick up his medications.  He reports symptoms started back again today.  He is complaining of severe diffuse abdominal pain and is requesting Dilaudid for his pain.  Patient with loud retching.  Plan for IV hydration, nausea control, small amounts of pain medicine, and  check labs.  Patient has an appointment tomorrow at [facility] and [place] who can assist him with his prescriptions so felt important to try to get him under control so that he can follow-up with this appointment.   I personally performed the services described in this documentation, which was scribed in my presence. The recorded information has been reviewed and is accurate.   1:38 AM Patient demanding Dilaudid.  I have attempted to explain to patient that I do not feel narcotics are helpful given his history of gastroparesis.  Opiates slow the GI tract worsening condition, and per prior gastroenterology consult notes, they recommend avoiding opiates.  Patient reports no improvement with fentanyl given.  He feels that only Dilaudid will help.  I have offered the patient.  Other modalities to help with pain and nausea.  Patient reports that he knows he will not get better without Dilaudid and will need to be admitted.  Again I expressed to patient that it is important for him to follow-up with the clinic  tomorrow to get established with a primary care doctor and get his medications filled.  No signs of acute kidney injury or DKA.  4:05 AM Patient has been sleeping, reported initially nausea had improved, but that he was not ready to go home.  They felt too weak.  Patient continue with IV fluids.  Reassessment at this time.  Patient reports nausea has returned and he has begun vomiting again.  Patient has failed all ED attempts to control his nausea and vomiting.  Will discuss with hospitalist for readmission to the hospital  Duane Flemings, MD 05/05/14 [phone]

## 2014-05-04 NOTE — ED Notes (Addendum)
Blood draw attempted with IV placement, Access obtained, but could not draw blood sample. IV flushed x3 with no apparent infiltrate.

## 2014-05-04 NOTE — ED Notes (Signed)
Patient with chronic diabetic induced gastroparesis. Was discharged from her Monday. Today symptoms returned. Patient actively vomiting in triage.

## 2014-05-05 ENCOUNTER — Inpatient Hospital Stay: Payer: Self-pay | Admitting: Family Medicine

## 2014-05-05 ENCOUNTER — Encounter (HOSPITAL_COMMUNITY): Payer: Self-pay | Admitting: Internal Medicine

## 2014-05-05 DIAGNOSIS — E1069 Type 1 diabetes mellitus with other specified complication: Secondary | ICD-10-CM

## 2014-05-05 DIAGNOSIS — I10 Essential (primary) hypertension: Secondary | ICD-10-CM

## 2014-05-05 DIAGNOSIS — K219 Gastro-esophageal reflux disease without esophagitis: Secondary | ICD-10-CM | POA: Diagnosis present

## 2014-05-05 DIAGNOSIS — K3184 Gastroparesis: Secondary | ICD-10-CM | POA: Diagnosis present

## 2014-05-05 DIAGNOSIS — Z9114 Patient's other noncompliance with medication regimen: Secondary | ICD-10-CM | POA: Diagnosis present

## 2014-05-05 DIAGNOSIS — Z6833 Body mass index (BMI) 33.0-33.9, adult: Secondary | ICD-10-CM | POA: Diagnosis not present

## 2014-05-05 DIAGNOSIS — E669 Obesity, unspecified: Secondary | ICD-10-CM | POA: Diagnosis present

## 2014-05-05 DIAGNOSIS — K210 Gastro-esophageal reflux disease with esophagitis: Secondary | ICD-10-CM

## 2014-05-05 DIAGNOSIS — E1043 Type 1 diabetes mellitus with diabetic autonomic (poly)neuropathy: Secondary | ICD-10-CM | POA: Diagnosis present

## 2014-05-05 DIAGNOSIS — R112 Nausea with vomiting, unspecified: Secondary | ICD-10-CM

## 2014-05-05 DIAGNOSIS — E860 Dehydration: Secondary | ICD-10-CM | POA: Diagnosis present

## 2014-05-05 DIAGNOSIS — E1065 Type 1 diabetes mellitus with hyperglycemia: Secondary | ICD-10-CM | POA: Diagnosis present

## 2014-05-05 DIAGNOSIS — E1143 Type 2 diabetes mellitus with diabetic autonomic (poly)neuropathy: Secondary | ICD-10-CM

## 2014-05-05 DIAGNOSIS — Z79899 Other long term (current) drug therapy: Secondary | ICD-10-CM | POA: Diagnosis not present

## 2014-05-05 DIAGNOSIS — G43A1 Cyclical vomiting, intractable: Secondary | ICD-10-CM

## 2014-05-05 DIAGNOSIS — Z794 Long term (current) use of insulin: Secondary | ICD-10-CM | POA: Diagnosis not present

## 2014-05-05 LAB — CBC
HCT: 37 % — ABNORMAL LOW (ref 39.0–52.0)
Hemoglobin: 13 g/dL (ref 13.0–17.0)
MCH: 28.2 pg (ref 26.0–34.0)
MCHC: 35.1 g/dL (ref 30.0–36.0)
MCV: 80.3 fL (ref 78.0–100.0)
Platelets: 260 10*3/uL (ref 150–400)
RBC: 4.61 MIL/uL (ref 4.22–5.81)
RDW: 12.5 % (ref 11.5–15.5)
WBC: 7.6 10*3/uL (ref 4.0–10.5)

## 2014-05-05 LAB — COMPREHENSIVE METABOLIC PANEL
ALK PHOS: 83 U/L (ref 39–117)
ALT: 24 U/L (ref 0–53)
ANION GAP: 13 (ref 5–15)
AST: 24 U/L (ref 0–37)
Albumin: 4.4 g/dL (ref 3.5–5.2)
BUN: 7 mg/dL (ref 6–23)
CALCIUM: 9.4 mg/dL (ref 8.4–10.5)
CO2: 25 mmol/L (ref 19–32)
Chloride: 103 mmol/L (ref 96–112)
Creatinine, Ser: 1.28 mg/dL (ref 0.50–1.35)
GFR calc non Af Amer: 70 mL/min — ABNORMAL LOW (ref >90)
GFR, EST AFRICAN AMERICAN: 81 mL/min — AB (ref >90)
Glucose, Bld: 176 mg/dL — ABNORMAL HIGH (ref 70–99)
Potassium: 3.5 mmol/L (ref 3.5–5.1)
Sodium: 141 mmol/L (ref 135–145)
Total Bilirubin: 0.7 mg/dL (ref 0.3–1.2)
Total Protein: 7.4 g/dL (ref 6.0–8.3)

## 2014-05-05 LAB — CBG MONITORING, ED: Glucose-Capillary: 218 mg/dL — ABNORMAL HIGH (ref 70–99)

## 2014-05-05 LAB — GLUCOSE, CAPILLARY
GLUCOSE-CAPILLARY: 205 mg/dL — AB (ref 70–99)
GLUCOSE-CAPILLARY: 201 mg/dL — AB (ref 70–99)
GLUCOSE-CAPILLARY: 169 mg/dL — AB (ref 70–99)
Glucose-Capillary: 154 mg/dL — ABNORMAL HIGH (ref 70–99)
Glucose-Capillary: 133 mg/dL — ABNORMAL HIGH (ref 70–99)

## 2014-05-05 LAB — BASIC METABOLIC PANEL
Anion gap: 10 (ref 5–15)
BUN: 5 mg/dL — AB (ref 6–23)
CALCIUM: 8.7 mg/dL (ref 8.4–10.5)
CHLORIDE: 103 mmol/L (ref 96–112)
CO2: 25 mmol/L (ref 19–32)
CREATININE: 1.21 mg/dL (ref 0.50–1.35)
GFR calc Af Amer: 87 mL/min — ABNORMAL LOW (ref >90)
GFR calc non Af Amer: 75 mL/min — ABNORMAL LOW (ref >90)
Glucose, Bld: 193 mg/dL — ABNORMAL HIGH (ref 70–99)
Potassium: 4.2 mmol/L (ref 3.5–5.1)
Sodium: 138 mmol/L (ref 135–145)

## 2014-05-05 LAB — LIPASE, BLOOD: Lipase: 19 U/L (ref 11–59)

## 2014-05-05 MED ORDER — ACETAMINOPHEN 500 MG PO TABS
500.0000 mg | ORAL_TABLET | Freq: Four times a day (QID) | ORAL | Status: DC | PRN
  Administered 2014-05-05: 500 mg via ORAL
  Filled 2014-05-05: qty 1

## 2014-05-05 MED ORDER — HALOPERIDOL LACTATE 5 MG/ML IJ SOLN
2.0000 mg | Freq: Once | INTRAMUSCULAR | Status: AC
  Administered 2014-05-05: 2 mg via INTRAVENOUS
  Filled 2014-05-05: qty 1

## 2014-05-05 MED ORDER — SODIUM CHLORIDE 0.9 % IV SOLN
Freq: Once | INTRAVENOUS | Status: AC
  Administered 2014-05-05: 05:00:00 via INTRAVENOUS

## 2014-05-05 MED ORDER — PANTOPRAZOLE SODIUM 40 MG PO TBEC: 40.0000 mg | DELAYED_RELEASE_TABLET | Freq: Two times a day (BID) | ORAL | Status: DC

## 2014-05-05 MED ORDER — GABAPENTIN 300 MG PO CAPS
300.0000 mg | ORAL_CAPSULE | Freq: Every day | ORAL | Status: DC
  Administered 2014-05-05: 300 mg via ORAL
  Filled 2014-05-05 (×2): qty 1

## 2014-05-05 MED ORDER — LORAZEPAM 2 MG/ML IJ SOLN
1.0000 mg | Freq: Once | INTRAMUSCULAR | Status: AC
  Administered 2014-05-05: 1 mg via INTRAVENOUS
  Filled 2014-05-05: qty 1

## 2014-05-05 MED ORDER — SODIUM CHLORIDE 0.9 % IV SOLN
INTRAVENOUS | Status: DC
  Administered 2014-05-05: 75 mL/h via INTRAVENOUS

## 2014-05-05 MED ORDER — METOCLOPRAMIDE HCL 5 MG/ML IJ SOLN
10.0000 mg | Freq: Three times a day (TID) | INTRAMUSCULAR | Status: DC
  Administered 2014-05-05 – 2014-05-06 (×4): 10 mg via INTRAVENOUS
  Filled 2014-05-05 (×6): qty 2

## 2014-05-05 MED ORDER — HYDRALAZINE HCL 20 MG/ML IJ SOLN
5.0000 mg | INTRAMUSCULAR | Status: DC | PRN
  Administered 2014-05-05: 5 mg via INTRAVENOUS
  Filled 2014-05-05: qty 1

## 2014-05-05 MED ORDER — METOPROLOL TARTRATE 50 MG PO TABS
50.0000 mg | ORAL_TABLET | Freq: Two times a day (BID) | ORAL | Status: DC
  Administered 2014-05-05 – 2014-05-06 (×3): 50 mg via ORAL
  Filled 2014-05-05 (×4): qty 1

## 2014-05-05 MED ORDER — AMLODIPINE BESYLATE 10 MG PO TABS
10.0000 mg | ORAL_TABLET | Freq: Every morning | ORAL | Status: DC
  Administered 2014-05-05 – 2014-05-06 (×2): 10 mg via ORAL
  Filled 2014-05-05 (×2): qty 1

## 2014-05-05 MED ORDER — METOCLOPRAMIDE HCL 5 MG/ML IJ SOLN
10.0000 mg | Freq: Three times a day (TID) | INTRAMUSCULAR | Status: DC | PRN
  Filled 2014-05-05: qty 2

## 2014-05-05 MED ORDER — INSULIN ASPART 100 UNIT/ML ~~LOC~~ SOLN
0.0000 [IU] | Freq: Three times a day (TID) | SUBCUTANEOUS | Status: DC
  Administered 2014-05-05: 3 [IU] via SUBCUTANEOUS
  Administered 2014-05-05 (×2): 2 [IU] via SUBCUTANEOUS
  Administered 2014-05-06: 3 [IU] via SUBCUTANEOUS
  Administered 2014-05-06: 1 [IU] via SUBCUTANEOUS

## 2014-05-05 MED ORDER — SUCRALFATE 1 G PO TABS
1.0000 g | ORAL_TABLET | Freq: Three times a day (TID) | ORAL | Status: DC
  Administered 2014-05-05 – 2014-05-06 (×4): 1 g via ORAL
  Filled 2014-05-05 (×7): qty 1

## 2014-05-05 MED ORDER — INSULIN GLARGINE 100 UNIT/ML ~~LOC~~ SOLN
20.0000 [IU] | Freq: Every day | SUBCUTANEOUS | Status: DC
  Administered 2014-05-05: 20 [IU] via SUBCUTANEOUS
  Filled 2014-05-05 (×2): qty 0.2

## 2014-05-05 MED ORDER — PROMETHAZINE HCL 25 MG/ML IJ SOLN
25.0000 mg | Freq: Once | INTRAMUSCULAR | Status: AC
  Administered 2014-05-05: 25 mg via INTRAMUSCULAR
  Filled 2014-05-05: qty 1

## 2014-05-05 MED ORDER — PROMETHAZINE HCL 25 MG/ML IJ SOLN
25.0000 mg | Freq: Four times a day (QID) | INTRAMUSCULAR | Status: DC | PRN
  Administered 2014-05-05: 25 mg via INTRAVENOUS
  Filled 2014-05-05: qty 1

## 2014-05-05 MED ORDER — HEPARIN SODIUM (PORCINE) 5000 UNIT/ML IJ SOLN
5000.0000 [IU] | Freq: Three times a day (TID) | INTRAMUSCULAR | Status: DC
  Administered 2014-05-05 – 2014-05-06 (×2): 5000 [IU] via SUBCUTANEOUS
  Filled 2014-05-05 (×3): qty 1

## 2014-05-05 MED ORDER — PANTOPRAZOLE SODIUM 40 MG IV SOLR
40.0000 mg | INTRAVENOUS | Status: DC
  Administered 2014-05-05 – 2014-05-06 (×2): 40 mg via INTRAVENOUS
  Filled 2014-05-05 (×2): qty 40

## 2014-05-05 NOTE — Care Management Note (Unsigned)
    Page 1 of 1   05/05/2014     3:01:19 PM CARE MANAGEMENT NOTE 05/05/2014  Patient:  RANY, TENESACA   Account Number:  [id number]  Date Initiated:  05/05/2014  Documentation initiated by:  Tomi Bamberger  Subjective/Objective Assessment:   dx emesis  admit- from home     Action/Plan:   Anticipated DC Date:  05/07/2014   Anticipated DC Plan:  [place]  CM consult  Medication [facility]      Choice offered to / List presented to:             Status of service:  In process, will continue to follow Medicare Important Message given?  NO (If response is "NO", the following Medicare IM given date fields will be blank) Date Medicare IM given:   Medicare IM given by:   Date Additional Medicare IM given:   Additional Medicare IM given by:    Discharge Disposition:    Per UR Regulation:  Reviewed for med. necessity/level of care/duration of stay  If discussed at [place] of Stay Meetings, dates discussed:    Comments:  05/05/14 [place] [phone] NCM spoke with patient, he states he actually had an appt at [facility] clinic today, but he is here,  NCM called [facility] clinic and scheduled apt for 3/25 at 9am.

## 2014-05-05 NOTE — Progress Notes (Signed)
NURSING PROGRESS NOTE  S[place]o [id number] Admission Data: 05/05/2014 5:49 AM Attending Provider: Ivor Costa, MD [id number], ADAKU, MD Code Status: Full   Xsavier Matusik is a 39 y.o. male patient admitted from ED:  -No acute distress noted.  -No complaints of shortness of breath.  -No complaints of chest pain.     Blood pressure 147/91, pulse 111, temperature 98.6 F (37 C), temperature source Oral, resp. rate 19, weight 127.007 kg (280 lb), SpO2 98 %.   IV Fluids:  IV in place, occlusive dsg intact without redness, IV cath forearm right, condition patent and no redness normal saline 150.   Allergies:  Review of patient's allergies indicates no known allergies.  Past Medical History:   has a past medical history of Hypertension; Diabetes mellitus without complication (age 49); Gastritis; Gastroparesis (2012); Obesity; Diabetic neuropathy; and GERD (gastroesophageal reflux disease).  Past Surgical History:   has past surgical history that includes Cholecystectomy (2013); Knee surgery (Left); Flexible sigmoidoscopy (N/A, 06/11/2013); and Esophagogastroduodenoscopy (N/A, 06/11/2013).  Social History:   reports that he has never smoked. He has never used smokeless tobacco. He reports that he drinks alcohol. He reports that he does not use illicit drugs.  Skin: intact  Patient/Family orientated to room. Information packet given to patient/family. Admission inpatient armband information verified with patient/family to include name and date of birth and placed on patient arm. Side rails up x 2, fall assessment and education completed with patient/family. Patient/family able to verbalize understanding of risk associated with falls and verbalized understanding to call for assistance before getting out of bed. Call light within reach. Patient/family able to voice and demonstrate understanding of unit orientation instructions.    Will continue to evaluate and treat per MD orders.

## 2014-05-05 NOTE — ED Notes (Signed)
Dr. Sharol Given at bedside, discussed with patient that he will not be receiving narcotic pain meds at this time due to his GI doctors recommendations.

## 2014-05-05 NOTE — H&P (Addendum)
[facility] History and Physical  Duane [name] [id number] DOB: Mar 14, 1975 DOA: 05/04/2014  Referring physician: ED physician PCP: [name], MD  Specialists:   Chief Complaint: Severe nausea and vomiting  HPI: Duane [name] is a 39 y.o. male with PMH of DM-I, hypertension, gastroparesis, diabetic neuropathy, obesity, medication noncompliance, GERD, presents with severe nausea and vomiting.  Patient was recently hospitalized from 3/17 to 3/21 because of severe nausea and vomiting secondary to gastroparesis. He reports he is noncompliant with his medications due to lack of insurance. His nausea and vomiting have been progressively getting worse. He also has diffuse abdominal pain. It is constant, 10/10 in severity and nonradiating. He does not have a diarrhea. Patient denies fever, chills, cough, chest pain, SOB, dysuria, urgency, frequency, hematuria, skin rashes or leg swelling. No unilateral weakness, numbness or tingling sensations. No vision change or hearing loss.  In ED, patient was found to have negative lipase, WBC 6.4, temperature normal, electrolytes okay, renal function okay, no DKA. Patient is admitted to inpatient for further evaluation and treatment.  Review of Systems: As presented in the history of presenting illness, rest negative.  Where does patient live?  At home Can patient participate in ADLs? Yes  Allergy: No Known Allergies  Past Medical History  Diagnosis Date  . Hypertension   . Diabetes mellitus without complication age 81    insulin requiring from the start  . Gastritis   . Gastroparesis 2012    100% gastric retention on 08/2013 GES  . Obesity   . Diabetic neuropathy   . GERD (gastroesophageal reflux disease)     Past Surgical History  Procedure Laterality Date  . Cholecystectomy  2013  . Knee surgery Left   . Flexible sigmoidoscopy N/A 06/11/2013    Procedure: FLEXIBLE SIGMOIDOSCOPY;  Surgeon: [name], MD;  Location: [facility];   Service: Endoscopy;  Laterality: N/A;  . Esophagogastroduodenoscopy N/A 06/11/2013    Procedure: ESOPHAGOGASTRODUODENOSCOPY (EGD);  Surgeon: [name], MD;  Location: [facility];  Service: Endoscopy;  Laterality: N/A;    Social History:  reports that he has never smoked. He has never used smokeless tobacco. He reports that he drinks alcohol. He reports that he does not use illicit drugs.  Family History:  Family History  Problem Relation Age of Onset  . Diabetes Father      Prior to Admission medications   Medication Sig Start Date End Date Taking? Authorizing Provider  amLODipine (NORVASC) 10 MG tablet Take 1 tablet (10 mg total) by mouth every morning. 05/03/14  Yes [name], MD  gabapentin (NEURONTIN) 300 MG capsule Take 1 capsule (300 mg total) by mouth at bedtime. 05/03/14  Yes [name], MD  insulin aspart (NOVOLOG) cartridge Inject 8 Units into the skin 3 (three) times daily with meals. 05/03/14  Yes [name], MD  insulin glargine (LANTUS) 100 UNIT/ML injection Inject 0.32 mLs (32 Units total) into the skin at bedtime. 05/03/14  Yes [name], MD  metoprolol (LOPRESSOR) 50 MG tablet Take 1 tablet (50 mg total) by mouth 2 (two) times daily. 05/03/14  Yes [name], MD  acetaminophen (TYLENOL) 500 MG tablet Take 500 mg by mouth every 6 (six) hours as needed for mild pain.     Historical Provider, MD  glucose blood (ONE TOUCH ULTRA TEST) test strip Use as instructed to check blood sugar once a day dx code 250.00 02/05/14   [name], MD  metoCLOPramide (REGLAN) 10 MG tablet Take 1 tablet (10 mg  total) by mouth every 8 (eight) hours as needed for nausea. 05/03/14   [name], MD  pantoprazole (PROTONIX) 40 MG tablet Take 1 tablet (40 mg total) by mouth 2 (two) times daily. 05/03/14   [name], MD  promethazine (PHENERGAN) 25 MG tablet Take 1 tablet (25 mg total) by mouth every 8 (eight) hours as needed for nausea or vomiting. 01/10/14   [name], MD   sucralfate (CARAFATE) 1 G tablet Take 1 tablet (1 g total) by mouth 3 (three) times daily with meals. 05/03/14   [name], MD    Physical Exam: Filed Vitals:   05/05/14 0245 05/05/14 0300 05/05/14 0315 05/05/14 0330  BP: 190/99 183/97 163/83 170/88  Pulse: 91 95 98 97  Temp:      TempSrc:      Resp:      Weight:      SpO2: 98% 98% 98% 98%   General: Not in acute distress, dry mucous in the membrane HEENT:       Eyes: PERRL, EOMI, no scleral icterus       ENT: No discharge from the ears and nose, no pharynx injection, no tonsillar enlargement.        Neck: No JVD, no bruit, no mass felt. Cardiac: S1/S2, RRR, No murmurs, No gallops or rubs Pulm: Good air movement bilaterally. Clear to auscultation bilaterally. No rales, wheezing, rhonchi or rubs. Abd: Soft, nondistended, diffused tenderness, no rebound pain, no organomegaly, BS present Ext: No edema bilaterally. 2+DP/PT pulse bilaterally Musculoskeletal: No joint deformities, erythema, or stiffness, ROM full Skin: No rashes.  Neuro: Alert and oriented X3, cranial nerves II-XII grossly intact, muscle strength 5/5 in all extremeties, sensation to light touch intact.  Psych: Patient is not psychotic, no suicidal or hemocidal ideation.  Labs on Admission:  Basic Metabolic Panel:  Recent Labs Lab 04/29/14 2238 04/30/14 0640 05/01/14 0405 05/02/14 0450 05/04/14 2315  NA 139 140 141 140 141  K 4.3 5.3* 3.8 4.1 3.5  CL 105 106 106 110 103  CO2 28 26 26 22 25   GLUCOSE 215* 221* 155* 124* 176*  BUN 11 11 13 13 7   CREATININE 1.26 1.26 1.40* 1.18 1.28  CALCIUM 9.4 9.1 8.8 8.3* 9.4   Liver Function Tests:  Recent Labs Lab 04/29/14 2238 04/30/14 0640 05/02/14 0450 05/04/14 2315  AST 17 25 15 24   ALT 11 18 13 24   ALKPHOS 91 77 61 83  BILITOT 0.8 0.8 0.8 0.7  PROT 7.2 7.4 5.9* 7.4  ALBUMIN 4.3 4.1 3.2* 4.4    Recent Labs Lab 04/29/14 2238 05/04/14 2315  LIPASE 23 19   No results for input(s): AMMONIA in the  last 168 hours. CBC:  Recent Labs Lab 04/29/14 2238 05/01/14 0405 05/02/14 0450 05/04/14 2315  WBC 7.3 9.3 6.0 6.4  NEUTROABS 3.8  --   --  3.8  HGB 13.8 12.1* 11.6* 14.1  HCT 42.1 37.4* 33.9* 41.1  MCV 82.5 83.5 81.5 80.0  PLT 278 255 225 253   Cardiac Enzymes: No results for input(s): CKTOTAL, CKMB, CKMBINDEX, TROPONINI in the last 168 hours.  BNP (last 3 results) No results for input(s): BNP in the last 8760 hours.  ProBNP (last 3 results) No results for input(s): PROBNP in the last 8760 hours.  CBG:  Recent Labs Lab 05/02/14 1151 05/02/14 1720 05/02/14 2217 05/03/14 0802 05/05/14 0055  GLUCAP 162* 161* 197* 164* 218*    Radiological Exams on Admission: No results found.  EKG: will get  one  Assessment/Plan Principal Problem:   Nausea with vomiting Active Problems:   Intractable nausea and vomiting   Diabetic gastroparesis   Diabetic neuropathy   Type I diabetes mellitus with complication, uncontrolled   Essential hypertension   GERD (gastroesophageal reflux disease)  Nausea and vomiting: It is most likely due to diabetic gastroparesis. Patient had EGD on 06/11/13, which showed hiatal hernia, no other significant abnormalities. Lipase negative. Patient does not have diarrhea, less likely to have gastroenteritis. Patient's electrolytes okay. Renal function okay. No leukocytosis,  not septic on admission. He is mildly dehydrated clinically.   -will admit to med-surg bed -Reglan 10 mg prn IV q8h -Phenergan 25 mg PRN q6h -Tylenol when necessary for pain -NPO -IVF: Received 1 L of normal saline bolus, followed by 50 mL per hour -Avoided narcotics due to the side effects of gastric dysmotility -Consult to case manager to help with medications need  DM-I: A1c was at 9.2 on 05/03/14. Patient is on Levemir at home. -Decrease Levemir dose from 30 to 20 units daily when patient is nothing by mouth. -Sliding-scale insulin  Hypertension: -Continue home on  amlodipine, metoprolol when able to take oral -IV hydralazine when necessary  Gerd:  -Protonix and Carafate  Diabetic neuropathy: -Continue Neurontin   DVT ppx: SQ Heparin        Code Status: Full code Family Communication: None at bed side.   Disposition Plan: Admit to inpatient   Date of Service 05/05/2014    Ivor Costa [facility] Pager [phone]  If 7PM-7AM, please contact night-coverage [email or web address] Password [facility] 05/05/2014, 4:58 AM

## 2014-05-05 NOTE — ED Notes (Addendum)
Patient reports he has vomited again, but is refusing phenergan suppository, Dr. Sharol Given aware

## 2014-05-05 NOTE — Progress Notes (Signed)
Pt seen and examined, Admitted with recurrent gastroparesis   -Long h/o non compliance  -Last gastric emptying scan 7/15 with severe gastroparesis -supportive care, start clears, schedule IV reglan, IVF, PPI -advance diet as tolerated -avoid opiates  DM: continue levemir at lower dose,  Hbaic was 9/2 this month , [place], MD [phone]

## 2014-05-05 NOTE — Progress Notes (Signed)
Noted that patient is NPO at this time. Recommend changing Novolog SENSITIVE correction scale to every 4 hours while NPO and then TID when eating. Harvel Ricks RN BSN CDE

## 2014-05-06 LAB — GLUCOSE, CAPILLARY
Glucose-Capillary: 146 mg/dL — ABNORMAL HIGH (ref 70–99)
Glucose-Capillary: 204 mg/dL — ABNORMAL HIGH (ref 70–99)

## 2014-05-06 MED ORDER — METOCLOPRAMIDE HCL 10 MG PO TABS: 10.0000 mg | ORAL_TABLET | Freq: Three times a day (TID) | ORAL | Status: DC

## 2014-05-06 MED ORDER — INSULIN GLARGINE 100 UNIT/ML ~~LOC~~ SOLN: 22.0000 [IU] | Freq: Every day | SUBCUTANEOUS | Status: DC

## 2014-05-06 NOTE — Progress Notes (Signed)
[name] to be D/C'd Home per MD order.  Discussed with the patient and all questions fully answered.  VSS, Skin clean, dry and intact without evidence of skin break down, no evidence of skin tears noted. IV catheter discontinued intact. Site without signs and symptoms of complications. Dressing and pressure applied.  An After Visit Summary was printed and given to the patient. Patient received prescription.  D/c education completed with patient/family including follow up instructions, medication list, d/c activities limitations if indicated, with other d/c instructions as indicated by MD - patient able to verbalize understanding, all questions fully answered.   Patient instructed to return to ED, call 911, or call MD for any changes in condition.   Patient escorted via [place], and D/C home via private auto.  Audria Nine F 05/06/2014 3:26 PM

## 2014-05-06 NOTE — Progress Notes (Signed)
Inpatient Diabetes Program Recommendations  AACE/ADA: New Consensus Statement on Inpatient Glycemic Control (2013)  Target Ranges:  Prepandial:   less than 140 mg/dL      Peak postprandial:   less than 180 mg/dL (1-2 hours)      Critically ill patients:  140 - 180 mg/dL   Results for Duane [name], Duane [name] (MRN [id number]) as of 05/06/2014 09:05  Ref. Range 05/05/2014 12:06 05/05/2014 16:59 05/05/2014 21:42 05/06/2014 07:34  Glucose-Capillary Latest Range: 70-99 mg/dL 169 (H) 154 (H) 133 (H) 146 (H)    Reason for assessment: Elevated Cbg  Diabetes history: Type 2 Outpatient Diabetes medications: Lantus 32 units qday, Novolog 8 units tid Current orders for Inpatient glycemic control: Lantus 20 units daily, Novolog 0-10 units tid  Please consider adding hs correction or increasing Lantus to 22 units qday.   Gentry Fitz, RN, BA, MHA, CDE Diabetes Coordinator Inpatient Diabetes Program  [phone] (Team Pager) [phone] [facility] Office) 05/06/2014 9:08 AM

## 2014-05-06 NOTE — Discharge Summary (Signed)
Physician Discharge Summary  Duane [name] [id number] DOB: 11-12-1975 DOA: 05/04/2014  PCP: Duane Pound, MD  Admit date: 05/04/2014 Discharge date: 05/06/2014  Time spent: 45 minutes  Recommendations for Outpatient Follow-up:  1. [facility] [place] tomorrow 3/25  Discharge Diagnoses:  Principal Problem:   Nausea with vomiting Active Problems:   Intractable nausea and vomiting   Diabetic gastroparesis   Diabetic neuropathy   Type I diabetes mellitus with complication, uncontrolled   Essential hypertension   GERD (gastroesophageal reflux disease)   Discharge Condition: stable  Diet recommendation: diabetic  Filed Weights   05/04/14 2215 05/05/14 0552  Weight: 127.007 kg (280 lb) 121.7 kg (268 lb 4.8 oz)    History of present illness:  Duane [name] is a 39 y.o. male with PMH of DM-I, hypertension, gastroparesis, diabetic neuropathy, obesity, medication noncompliance, GERD, presented to the ER with severe nausea and vomiting. Patient was recently hospitalized from 3/17 to 3/21 because of severe nausea and vomiting secondary to gastroparesis. He reports he is noncompliant with his medications due to lack of insurance. His nausea and vomiting have been progressively getting worse. He also has diffuse abdominal pain. It is constant, 10/10 in severity and nonradiating. In ED, patient was found to have negative lipase, WBC 6.4, temperature normal, electrolytes okay, renal function okay, no DKA. Patient was admitted to inpatient for further evaluation and treatment.  Hospital Course:  Recurrent gastroparesis  -Long h/o non compliance  -Last gastric emptying scan 7/15 with severe gastroparesis -treated with supportive care, started on clears, scheduled IV reglan -advanced diet as tolerated, eating regular food -avoided opiates, educated on need to avoid opiates long term as well. -we have re-scheduled his FU at the [facility] clinic to tomorrow  DM:   -continue levemir at lower dose, further titration as outpatient Hbaic was 9/2 this month , SSI -compliance emphasized  Rest of his medical problems were stable  Discharge Exam: Filed Vitals:   05/06/14 0915  BP: 149/88  Pulse: 81  Temp: 97.5 F (36.4 C)  Resp: 18    General: AAOx3, no distress Cardiovascular: S1S2/RRR Respiratory: CTAB  Discharge Instructions   Discharge Instructions    Diet Carb Modified    Complete by:  As directed      Increase activity slowly    Complete by:  As directed           Current Discharge Medication List    CONTINUE these medications which have CHANGED   Details  insulin glargine (LANTUS) 100 UNIT/ML injection Inject 0.22 mLs (22 Units total) into the skin at bedtime.    metoCLOPramide (REGLAN) 10 MG tablet Take 1 tablet (10 mg total) by mouth 3 (three) times daily before meals. For 1 week then as needed Qty: 90 tablet, Refills: 0      CONTINUE these medications which have NOT CHANGED   Details  amLODipine (NORVASC) 10 MG tablet Take 1 tablet (10 mg total) by mouth every morning. Qty: 30 tablet, Refills: 3    gabapentin (NEURONTIN) 300 MG capsule Take 1 capsule (300 mg total) by mouth at bedtime. Qty: 30 capsule, Refills: 0    insulin aspart (NOVOLOG) cartridge Inject 8 Units into the skin 3 (three) times daily with meals. Qty: 15 mL, Refills: 11    metoprolol (LOPRESSOR) 50 MG tablet Take 1 tablet (50 mg total) by mouth 2 (two) times daily. Qty: 60 tablet, Refills: 0    pantoprazole (PROTONIX) 40 MG tablet Take 1 tablet (40 mg total)  by mouth 2 (two) times daily. Qty: 60 tablet, Refills: 0    sucralfate (CARAFATE) 1 G tablet Take 1 tablet (1 g total) by mouth 3 (three) times daily with meals. Qty: 90 tablet, Refills: 1    glucose blood (ONE TOUCH ULTRA TEST) test strip Use as instructed to check blood sugar once a day dx code 250.00 Qty: 100 each, Refills: 0    promethazine (PHENERGAN) 25 MG tablet Take 1 tablet (25 mg  total) by mouth every 8 (eight) hours as needed for nausea or vomiting. Qty: 60 tablet, Refills: 0      STOP taking these medications     acetaminophen (TYLENOL) 500 MG tablet        No Known Allergies Follow-up Information    Follow up with [facility]     On 05/07/2014.   Why:  11 am for hospital follow up   Contact information:   [address] [place] [phone]       The results of significant diagnostics from this hospitalization (including imaging, microbiology, ancillary and laboratory) are listed below for reference.    Significant Diagnostic Studies: Dg Abd Acute W/chest  04/30/2014   CLINICAL DATA:  Acute onset of generalized abdominal pain and nausea. Initial encounter.  EXAM: ACUTE ABDOMEN SERIES (ABDOMEN 2 VIEW & CHEST 1 VIEW)  COMPARISON:  Chest radiograph performed 12/30/2013, and abdominal radiograph performed 02/03/2014  FINDINGS: The lungs are well-aerated and clear. There is no evidence of focal opacification, pleural effusion or pneumothorax. The cardiomediastinal silhouette is borderline enlarged.  The visualized bowel gas pattern is unremarkable. Scattered stool and air are seen within the colon; there is no evidence of small bowel dilatation to suggest obstruction. No free intra-abdominal air is identified on the provided upright view. Clips are noted within the right upper quadrant, reflecting prior cholecystectomy.  No acute osseous abnormalities are seen; the sacroiliac joints are unremarkable in appearance.  IMPRESSION: 1. Unremarkable bowel gas pattern; no free intra-abdominal air seen. Small to moderate amount of stool noted in the colon. 2. Borderline cardiomegaly; lungs remain clear.   Electronically Signed   By: Garald Balding M.D.   On: 04/30/2014 02:56    Microbiology: No results found for this or any previous visit (from the past 240 hour(s)).   Labs: Basic Metabolic Panel:  Recent  Labs Lab 04/30/14 0640 05/01/14 0405 05/02/14 0450 05/04/14 2315 05/05/14 0801  NA 140 141 140 141 138  K 5.3* 3.8 4.1 3.5 4.2  CL 106 106 110 103 103  CO2 26 26 22 25 25   GLUCOSE 221* 155* 124* 176* 193*  BUN 11 13 13 7  5*  CREATININE 1.26 1.40* 1.18 1.28 1.21  CALCIUM 9.1 8.8 8.3* 9.4 8.7   Liver Function Tests:  Recent Labs Lab 04/29/14 2238 04/30/14 0640 05/02/14 0450 05/04/14 2315  AST 17 25 15 24   ALT 11 18 13 24   ALKPHOS 91 77 61 83  BILITOT 0.8 0.8 0.8 0.7  PROT 7.2 7.4 5.9* 7.4  ALBUMIN 4.3 4.1 3.2* 4.4    Recent Labs Lab 04/29/14 2238 05/04/14 2315  LIPASE 23 19   No results for input(s): AMMONIA in the last 168 hours. CBC:  Recent Labs Lab 04/29/14 2238 05/01/14 0405 05/02/14 0450 05/04/14 2315 05/05/14 0801  WBC 7.3 9.3 6.0 6.4 7.6  NEUTROABS 3.8  --   --  3.8  --   HGB 13.8 12.1* 11.6* 14.1 13.0  HCT 42.1  37.4* 33.9* 41.1 37.0*  MCV 82.5 83.5 81.5 80.0 80.3  PLT 278 255 225 253 260   Cardiac Enzymes: No results for input(s): CKTOTAL, CKMB, CKMBINDEX, TROPONINI in the last 168 hours. BNP: BNP (last 3 results) No results for input(s): BNP in the last 8760 hours.  ProBNP (last 3 results) No results for input(s): PROBNP in the last 8760 hours.  CBG:  Recent Labs Lab 05/05/14 1206 05/05/14 1659 05/05/14 2142 05/06/14 0734 05/06/14 1138  GLUCAP 169* 154* 133* 146* 204*       Signed:  Rosser Collington  [facility] 05/06/2014, 12:27 PM

## 2014-05-07 ENCOUNTER — Ambulatory Visit: Payer: Medicaid Other | Attending: Family Medicine | Admitting: Physician Assistant

## 2014-05-07 ENCOUNTER — Encounter: Payer: Self-pay | Admitting: Physician Assistant

## 2014-05-07 VITALS — BP 123/80 | HR 91 | Temp 98.3°F | Resp 20 | Ht 75.0 in | Wt 278.0 lb

## 2014-05-07 DIAGNOSIS — K3184 Gastroparesis: Secondary | ICD-10-CM | POA: Diagnosis not present

## 2014-05-07 DIAGNOSIS — E109 Type 1 diabetes mellitus without complications: Secondary | ICD-10-CM | POA: Diagnosis present

## 2014-05-07 DIAGNOSIS — I10 Essential (primary) hypertension: Secondary | ICD-10-CM | POA: Diagnosis not present

## 2014-05-07 DIAGNOSIS — Z794 Long term (current) use of insulin: Secondary | ICD-10-CM | POA: Insufficient documentation

## 2014-05-07 DIAGNOSIS — R739 Hyperglycemia, unspecified: Secondary | ICD-10-CM

## 2014-05-07 LAB — GLUCOSE, POCT (MANUAL RESULT ENTRY): POC Glucose: 260 mg/dl — AB (ref 70–99)

## 2014-05-07 MED ORDER — GLUCOSE BLOOD VI STRP: ORAL_STRIP | Status: DC

## 2014-05-07 MED ORDER — INSULIN GLARGINE 100 UNIT/ML ~~LOC~~ SOLN: 22.0000 [IU] | Freq: Every day | SUBCUTANEOUS | Status: DC

## 2014-05-07 MED ORDER — ONETOUCH ULTRASOFT LANCETS MISC: Status: DC

## 2014-05-07 MED ORDER — SUCRALFATE 1 G PO TABS: 1.0000 g | ORAL_TABLET | Freq: Three times a day (TID) | ORAL | Status: DC

## 2014-05-07 MED ORDER — GABAPENTIN 300 MG PO CAPS: 300.0000 mg | ORAL_CAPSULE | Freq: Every day | ORAL | Status: DC

## 2014-05-07 MED ORDER — METOPROLOL TARTRATE 50 MG PO TABS: 50.0000 mg | ORAL_TABLET | Freq: Two times a day (BID) | ORAL | Status: DC

## 2014-05-07 MED ORDER — METOCLOPRAMIDE HCL 10 MG PO TABS: 10.0000 mg | ORAL_TABLET | Freq: Three times a day (TID) | ORAL | Status: DC

## 2014-05-07 MED ORDER — INSULIN ASPART 100 UNIT/ML ~~LOC~~ SOLN
10.0000 [IU] | Freq: Once | SUBCUTANEOUS | Status: AC
  Administered 2014-05-07: 10 [IU] via SUBCUTANEOUS

## 2014-05-07 MED ORDER — INSULIN PEN NEEDLE 31G X 8 MM MISC: Status: DC

## 2014-05-07 MED ORDER — PROMETHAZINE HCL 25 MG PO TABS: 25.0000 mg | ORAL_TABLET | Freq: Three times a day (TID) | ORAL | Status: DC | PRN

## 2014-05-07 MED ORDER — PANTOPRAZOLE SODIUM 40 MG PO TBEC: 40.0000 mg | DELAYED_RELEASE_TABLET | Freq: Two times a day (BID) | ORAL | Status: DC

## 2014-05-07 NOTE — Progress Notes (Signed)
Patient here for hospital follow-up for gastroparesis Patient denies nausea/vomiting and pain Patient indicates he recently started a clinical trial for diabetes. He indicates he takes a "pill to flush sugar out of kidneys" CBG 260

## 2014-05-07 NOTE — Progress Notes (Signed)
Duane [name]  [id number]  [id number]  DOB - 1975-07-19  Chief Complaint  Patient presents with  . Diabetes       Subjective:   Duane [name] is a 39 y.o. male here today for establishment of care. He was hospitalized from 05/05/2014 through 05/06/2014 for severe nausea and vomiting secondary to gastroparesis. This is actually his second term via hospitalized in the last month for the same thing. He entered a study for type I diabetics approximately 2-3 weeks ago. He has no insurance. He's been off of his blood sugar and blood pressure medications for months. He also was off of his Reglan and Protonix. His hemoglobin A1c was 9.2%. He was admitted for supportive care. He recently transitioned home within 24 hours. His blood sugar this morning is 260. He has not taken his insulin this morning.  Since discharge the nausea and vomiting have subsided. He denies chest pain or shortness of breath. He denies dizziness or lightheadedness. He does have some blurring of the vision. He does have some neuropathy type symptoms.  ROS: GEN: denies fever or chills, denies change in weight Skin: denies lesions or rashes HEENT: denies headache, earache, epistaxis, sore throat, or neck pain. + Blurred vision.  LUNGS: denies SHOB, dyspnea, PND, orthopnea CV: denies CP or palpitations ABD: denies abd pain, N or V EXT: denies muscle spasms or swelling; no pain in lower ext, no weakness NEURO: denies numbness or tingling, denies sz, stroke or TIA   ALLERGIES: No Known Allergies  PAST MEDICAL HISTORY: Past Medical History  Diagnosis Date  . Hypertension   . Diabetes mellitus without complication age 69    insulin requiring from the start  . Gastritis   . Gastroparesis 2012    100% gastric retention on 08/2013 GES  . Obesity   . Diabetic neuropathy   . GERD (gastroesophageal reflux disease)     PAST SURGICAL HISTORY: Past Surgical History  Procedure Laterality Date  . Cholecystectomy   2013  . Knee surgery Left   . Flexible sigmoidoscopy N/A 06/11/2013    Procedure: FLEXIBLE SIGMOIDOSCOPY;  Surgeon: [name], MD;  Location: [facility] ENDOSCOPY;  Service: Endoscopy;  Laterality: N/A;  . Esophagogastroduodenoscopy N/A 06/11/2013    Procedure: ESOPHAGOGASTRODUODENOSCOPY (EGD);  Surgeon: [name], MD;  Location: [facility];  Service: Endoscopy;  Laterality: N/A;    MEDICATIONS AT HOME: Prior to Admission medications   Medication Sig Start Date End Date Taking? Authorizing Provider  gabapentin (NEURONTIN) 300 MG capsule Take 1 capsule (300 mg total) by mouth at bedtime. 05/07/14  Yes Jessye Imhoff Daneil Dan, PA-C  glucose blood (ONE TOUCH ULTRA TEST) test strip Use as instructed to check blood sugar once a day dx code 250.00 05/07/14  Yes Lyndall Windt Daneil Dan, PA-C  insulin aspart (NOVOLOG) cartridge Inject 8 Units into the skin 3 (three) times daily with meals. 05/03/14  Yes [name], MD  insulin glargine (LANTUS) 100 UNIT/ML injection Inject 0.22 mLs (22 Units total) into the skin at bedtime. 05/07/14  Yes Xoey Warmoth Daneil Dan, PA-C  metoprolol (LOPRESSOR) 50 MG tablet Take 1 tablet (50 mg total) by mouth 2 (two) times daily. 05/07/14  Yes Baraka Klatt Daneil Dan, PA-C  promethazine (PHENERGAN) 25 MG tablet Take 1 tablet (25 mg total) by mouth every 8 (eight) hours as needed for nausea or vomiting. 05/07/14  Yes Everett Ricciardelli Daneil Dan, PA-C  amLODipine (NORVASC) 10 MG tablet Take 1 tablet (10 mg total) by mouth every morning. Patient not taking: Reported on  05/07/2014 05/03/14   [name], MD  Lancets (ONETOUCH ULTRASOFT) lancets Use as instructed 05/07/14   Brayton Caves, PA-C  metoCLOPramide (REGLAN) 10 MG tablet Take 1 tablet (10 mg total) by mouth 3 (three) times daily before meals. For 1 week then as needed 05/07/14   Brayton Caves, PA-C  pantoprazole (PROTONIX) 40 MG tablet Take 1 tablet (40 mg total) by mouth 2 (two) times daily. 05/07/14   Brayton Caves, PA-C  sucralfate (CARAFATE) 1 G tablet Take 1  tablet (1 g total) by mouth 3 (three) times daily with meals. 05/07/14   Brayton Caves, PA-C     Objective:   Filed Vitals:   05/07/14 1129  BP: 123/80  Pulse: 91  Temp: 98.3 F (36.8 C)  TempSrc: Oral  Resp: 20  Height: 6\' 3"  (1.905 m)  Weight: 278 lb (126.1 kg)  SpO2: 100%    Exam General appearance : Awake, alert, not in any distress. Speech Clear. Not toxic looking HEENT: Atraumatic and Normocephalic, pupils equally reactive to light and accomodation Neck: supple, no JVD. No cervical lymphadenopathy.  Chest:Good air entry bilaterally, no added sounds  CVS: S1 S2 regular, no murmurs.  Abdomen: Bowel sounds present, Non tender and not distended with no gaurding, rigidity or rebound. Extremities: B/L Lower Ext shows no edema, both legs are warm to touch Neurology: Awake alert, and oriented X 3, CN II-XII intact, Non focal Skin:No Rashes Wounds:N/A  Data Review Lab Results  Component Value Date   HGBA1C 9.2* 05/03/2014   HGBA1C 8.9* 02/03/2014   HGBA1C 9.3* 09/21/2013     Assessment & Plan  1. Type 1 insulin-dependent diabetes mellitus with complications  -refilled insulin, lancets, test strips, needles   -I did not increase regimen despite hyperglycemia. Patient has not demonstrated compliance  just yet.   -diabetes educator referral  -Contact the administrators of the "study drug" program that he is in with recent hospitalization  information   -10 units of NovoLog given today in clinic 2. Gastroparesis  -continue Reglan and Protonix   -Avoid opiates 3. Hypertension - controlled  -Continue triple therapy for now.   Referral to financial assistance program Return in about 2 weeks (around 05/21/2014). Routine health maintenance and uptitration of meds.   The patient was given clear instructions to go to ER or return to medical center if symptoms don't improve, worsen or new problems develop. The patient verbalized understanding. The patient was told to call to  get lab results if they haven't heard anything in the next week.   This note has been created with Surveyor, quantity. Any transcriptional errors are unintentional.    Zettie Pho, PA-C [facility] [place], [place]   05/07/2014, 12:16 PM

## 2014-05-10 ENCOUNTER — Encounter (HOSPITAL_COMMUNITY): Payer: Self-pay | Admitting: Emergency Medicine

## 2014-05-10 ENCOUNTER — Inpatient Hospital Stay (HOSPITAL_COMMUNITY)
Admission: EM | Admit: 2014-05-10 | Discharge: 2014-05-24 | DRG: 074 | Disposition: A | Discharge status: Home Health | Payer: Medicaid Other | Attending: Internal Medicine | Admitting: Internal Medicine

## 2014-05-10 DIAGNOSIS — Z9049 Acquired absence of other specified parts of digestive tract: Secondary | ICD-10-CM | POA: Diagnosis present

## 2014-05-10 DIAGNOSIS — K3184 Gastroparesis: Secondary | ICD-10-CM | POA: Diagnosis present

## 2014-05-10 DIAGNOSIS — R112 Nausea with vomiting, unspecified: Secondary | ICD-10-CM | POA: Diagnosis present

## 2014-05-10 DIAGNOSIS — E669 Obesity, unspecified: Secondary | ICD-10-CM | POA: Diagnosis present

## 2014-05-10 DIAGNOSIS — G43A0 Cyclical vomiting, not intractable: Secondary | ICD-10-CM | POA: Diagnosis present

## 2014-05-10 DIAGNOSIS — Z794 Long term (current) use of insulin: Secondary | ICD-10-CM

## 2014-05-10 DIAGNOSIS — E876 Hypokalemia: Secondary | ICD-10-CM | POA: Diagnosis present

## 2014-05-10 DIAGNOSIS — F322 Major depressive disorder, single episode, severe without psychotic features: Secondary | ICD-10-CM | POA: Diagnosis present

## 2014-05-10 DIAGNOSIS — G4700 Insomnia, unspecified: Secondary | ICD-10-CM | POA: Diagnosis present

## 2014-05-10 DIAGNOSIS — F329 Major depressive disorder, single episode, unspecified: Secondary | ICD-10-CM | POA: Diagnosis present

## 2014-05-10 DIAGNOSIS — Z79899 Other long term (current) drug therapy: Secondary | ICD-10-CM

## 2014-05-10 DIAGNOSIS — R1115 Cyclical vomiting syndrome unrelated to migraine: Secondary | ICD-10-CM

## 2014-05-10 DIAGNOSIS — Z833 Family history of diabetes mellitus: Secondary | ICD-10-CM

## 2014-05-10 DIAGNOSIS — R110 Nausea: Secondary | ICD-10-CM

## 2014-05-10 DIAGNOSIS — E1065 Type 1 diabetes mellitus with hyperglycemia: Secondary | ICD-10-CM | POA: Diagnosis present

## 2014-05-10 DIAGNOSIS — Z6834 Body mass index (BMI) 34.0-34.9, adult: Secondary | ICD-10-CM

## 2014-05-10 DIAGNOSIS — E1143 Type 2 diabetes mellitus with diabetic autonomic (poly)neuropathy: Secondary | ICD-10-CM | POA: Diagnosis present

## 2014-05-10 DIAGNOSIS — E860 Dehydration: Secondary | ICD-10-CM | POA: Diagnosis present

## 2014-05-10 DIAGNOSIS — K219 Gastro-esophageal reflux disease without esophagitis: Secondary | ICD-10-CM | POA: Diagnosis present

## 2014-05-10 DIAGNOSIS — E1043 Type 1 diabetes mellitus with diabetic autonomic (poly)neuropathy: Principal | ICD-10-CM | POA: Diagnosis present

## 2014-05-10 DIAGNOSIS — I10 Essential (primary) hypertension: Secondary | ICD-10-CM | POA: Diagnosis present

## 2014-05-10 LAB — COMPREHENSIVE METABOLIC PANEL
ALT: 23 U/L (ref 0–53)
ANION GAP: 12 (ref 5–15)
AST: 32 U/L (ref 0–37)
Albumin: 4.8 g/dL (ref 3.5–5.2)
Alkaline Phosphatase: 92 U/L (ref 39–117)
BILIRUBIN TOTAL: 0.5 mg/dL (ref 0.3–1.2)
BUN: 9 mg/dL (ref 6–23)
CO2: 26 mmol/L (ref 19–32)
CREATININE: 1.16 mg/dL (ref 0.50–1.35)
Calcium: 9.5 mg/dL (ref 8.4–10.5)
Chloride: 105 mmol/L (ref 96–112)
GFR calc Af Amer: >90 mL/min (ref >90)
GFR calc non Af Amer: 79 mL/min — ABNORMAL LOW (ref >90)
Glucose, Bld: 164 mg/dL — ABNORMAL HIGH (ref 70–99)
Potassium: 3.5 mmol/L (ref 3.5–5.1)
SODIUM: 143 mmol/L (ref 135–145)
TOTAL PROTEIN: 8.4 g/dL — AB (ref 6.0–8.3)

## 2014-05-10 LAB — LIPASE, BLOOD: Lipase: 19 U/L (ref 11–59)

## 2014-05-10 LAB — CBC WITH DIFFERENTIAL/PLATELET
BASOS ABS: 0 10*3/uL (ref 0.0–0.1)
Basophils Relative: 0 % (ref 0–1)
Eosinophils Absolute: 0.1 10*3/uL (ref 0.0–0.7)
Eosinophils Relative: 1 % (ref 0–5)
HCT: 43.5 % (ref 39.0–52.0)
HEMOGLOBIN: 14.9 g/dL (ref 13.0–17.0)
LYMPHS ABS: 2.9 10*3/uL (ref 0.7–4.0)
LYMPHS PCT: 31 % (ref 12–46)
MCH: 28 pg (ref 26.0–34.0)
MCHC: 34.3 g/dL (ref 30.0–36.0)
MCV: 81.6 fL (ref 78.0–100.0)
MONO ABS: 0.6 10*3/uL (ref 0.1–1.0)
Monocytes Relative: 6 % (ref 3–12)
Neutro Abs: 5.6 10*3/uL (ref 1.7–7.7)
Neutrophils Relative %: 62 % (ref 43–77)
Platelets: 307 10*3/uL (ref 150–400)
RBC: 5.33 MIL/uL (ref 4.22–5.81)
RDW: 12.8 % (ref 11.5–15.5)
WBC: 9.2 10*3/uL (ref 4.0–10.5)

## 2014-05-10 LAB — I-STAT TROPONIN, ED: Troponin i, poc: 0 ng/mL (ref 0.00–0.08)

## 2014-05-10 MED ORDER — PROMETHAZINE HCL 25 MG/ML IJ SOLN
25.0000 mg | Freq: Four times a day (QID) | INTRAMUSCULAR | Status: DC | PRN
  Administered 2014-05-11: 25 mg via INTRAMUSCULAR
  Filled 2014-05-10 (×3): qty 1

## 2014-05-10 NOTE — ED Notes (Signed)
Bed: [id number] Expected date: 05/10/14 Expected time: 9:41 PM Means of arrival: Ambulance Comments: 39 yo M  Laceration to foot

## 2014-05-10 NOTE — ED Notes (Signed)
Pt states that he has a hx of diabetic gastroparesis and was hospitalized for the same last week. Vomiting, chest pain and abdominal pain. Actively vomiting in triage. Alert and oriented.

## 2014-05-11 ENCOUNTER — Encounter (HOSPITAL_COMMUNITY): Payer: Self-pay | Admitting: Internal Medicine

## 2014-05-11 DIAGNOSIS — Z794 Long term (current) use of insulin: Secondary | ICD-10-CM | POA: Diagnosis not present

## 2014-05-11 DIAGNOSIS — R112 Nausea with vomiting, unspecified: Secondary | ICD-10-CM | POA: Diagnosis present

## 2014-05-11 DIAGNOSIS — E669 Obesity, unspecified: Secondary | ICD-10-CM | POA: Diagnosis present

## 2014-05-11 DIAGNOSIS — Z9049 Acquired absence of other specified parts of digestive tract: Secondary | ICD-10-CM | POA: Diagnosis present

## 2014-05-11 DIAGNOSIS — I10 Essential (primary) hypertension: Secondary | ICD-10-CM | POA: Diagnosis present

## 2014-05-11 DIAGNOSIS — R1110 Vomiting, unspecified: Secondary | ICD-10-CM

## 2014-05-11 DIAGNOSIS — E876 Hypokalemia: Secondary | ICD-10-CM | POA: Diagnosis present

## 2014-05-11 DIAGNOSIS — E1143 Type 2 diabetes mellitus with diabetic autonomic (poly)neuropathy: Secondary | ICD-10-CM

## 2014-05-11 DIAGNOSIS — G43A0 Cyclical vomiting, not intractable: Secondary | ICD-10-CM | POA: Diagnosis present

## 2014-05-11 DIAGNOSIS — E860 Dehydration: Secondary | ICD-10-CM | POA: Diagnosis present

## 2014-05-11 DIAGNOSIS — Z833 Family history of diabetes mellitus: Secondary | ICD-10-CM | POA: Diagnosis not present

## 2014-05-11 DIAGNOSIS — E1065 Type 1 diabetes mellitus with hyperglycemia: Secondary | ICD-10-CM | POA: Diagnosis present

## 2014-05-11 DIAGNOSIS — Z79899 Other long term (current) drug therapy: Secondary | ICD-10-CM | POA: Diagnosis not present

## 2014-05-11 DIAGNOSIS — G4700 Insomnia, unspecified: Secondary | ICD-10-CM | POA: Diagnosis present

## 2014-05-11 DIAGNOSIS — E1043 Type 1 diabetes mellitus with diabetic autonomic (poly)neuropathy: Secondary | ICD-10-CM | POA: Diagnosis present

## 2014-05-11 DIAGNOSIS — K3184 Gastroparesis: Secondary | ICD-10-CM | POA: Diagnosis present

## 2014-05-11 DIAGNOSIS — F329 Major depressive disorder, single episode, unspecified: Secondary | ICD-10-CM | POA: Diagnosis present

## 2014-05-11 DIAGNOSIS — Z6834 Body mass index (BMI) 34.0-34.9, adult: Secondary | ICD-10-CM | POA: Diagnosis not present

## 2014-05-11 DIAGNOSIS — K219 Gastro-esophageal reflux disease without esophagitis: Secondary | ICD-10-CM | POA: Diagnosis present

## 2014-05-11 LAB — COMPREHENSIVE METABOLIC PANEL
ALBUMIN: 4 g/dL (ref 3.5–5.2)
ALT: 22 U/L (ref 0–53)
AST: 25 U/L (ref 0–37)
Alkaline Phosphatase: 73 U/L (ref 39–117)
Anion gap: 11 (ref 5–15)
BUN: 9 mg/dL (ref 6–23)
CO2: 26 mmol/L (ref 19–32)
CREATININE: 1.01 mg/dL (ref 0.50–1.35)
Calcium: 8.6 mg/dL (ref 8.4–10.5)
Chloride: 103 mmol/L (ref 96–112)
GFR calc Af Amer: >90 mL/min (ref >90)
GFR calc non Af Amer: >90 mL/min (ref >90)
Glucose, Bld: 211 mg/dL — ABNORMAL HIGH (ref 70–99)
Potassium: 4 mmol/L (ref 3.5–5.1)
Sodium: 140 mmol/L (ref 135–145)
TOTAL PROTEIN: 7.5 g/dL (ref 6.0–8.3)
Total Bilirubin: 0.8 mg/dL (ref 0.3–1.2)

## 2014-05-11 LAB — GLUCOSE, CAPILLARY
Glucose-Capillary: 189 mg/dL — ABNORMAL HIGH (ref 70–99)
Glucose-Capillary: 144 mg/dL — ABNORMAL HIGH (ref 70–99)
Glucose-Capillary: 131 mg/dL — ABNORMAL HIGH (ref 70–99)

## 2014-05-11 LAB — CBC WITH DIFFERENTIAL/PLATELET
BASOS PCT: 0 % (ref 0–1)
Basophils Absolute: 0 10*3/uL (ref 0.0–0.1)
Eosinophils Absolute: 0 10*3/uL (ref 0.0–0.7)
Eosinophils Relative: 0 % (ref 0–5)
HCT: 41 % (ref 39.0–52.0)
Hemoglobin: 14 g/dL (ref 13.0–17.0)
Lymphocytes Relative: 16 % (ref 12–46)
Lymphs Abs: 2 10*3/uL (ref 0.7–4.0)
MCH: 27.8 pg (ref 26.0–34.0)
MCHC: 34.1 g/dL (ref 30.0–36.0)
MCV: 81.3 fL (ref 78.0–100.0)
MONO ABS: 0.7 10*3/uL (ref 0.1–1.0)
Monocytes Relative: 5 % (ref 3–12)
NEUTROS PCT: 79 % — AB (ref 43–77)
Neutro Abs: 10.3 10*3/uL — ABNORMAL HIGH (ref 1.7–7.7)
Platelets: 283 10*3/uL (ref 150–400)
RBC: 5.04 MIL/uL (ref 4.22–5.81)
RDW: 13 % (ref 11.5–15.5)
WBC: 13 10*3/uL — AB (ref 4.0–10.5)

## 2014-05-11 MED ORDER — METOCLOPRAMIDE HCL 5 MG/ML IJ SOLN
5.0000 mg | Freq: Three times a day (TID) | INTRAMUSCULAR | Status: DC
Start: 2014-05-11 — End: 2014-05-11

## 2014-05-11 MED ORDER — HYDRALAZINE HCL 20 MG/ML IJ SOLN
10.0000 mg | INTRAMUSCULAR | Status: DC | PRN
  Administered 2014-05-13 – 2014-05-14 (×3): 10 mg via INTRAVENOUS
  Filled 2014-05-11 (×3): qty 1

## 2014-05-11 MED ORDER — METOCLOPRAMIDE HCL 5 MG/ML IJ SOLN
5.0000 mg | Freq: Four times a day (QID) | INTRAMUSCULAR | Status: AC
  Administered 2014-05-11 (×2): 5 mg via INTRAVENOUS
  Filled 2014-05-11 (×2): qty 2

## 2014-05-11 MED ORDER — METOCLOPRAMIDE HCL 5 MG/ML IJ SOLN
5.0000 mg | Freq: Three times a day (TID) | INTRAMUSCULAR | Status: DC
Start: 2014-05-12 — End: 2014-05-15
  Administered 2014-05-12 – 2014-05-15 (×11): 5 mg via INTRAVENOUS
  Filled 2014-05-11 (×3): qty 1
  Filled 2014-05-11: qty 2
  Filled 2014-05-11 (×7): qty 1
  Filled 2014-05-11: qty 2
  Filled 2014-05-11 (×2): qty 1

## 2014-05-11 MED ORDER — ONDANSETRON HCL 4 MG PO TABS: 4.0000 mg | ORAL_TABLET | Freq: Four times a day (QID) | ORAL | Status: DC | PRN

## 2014-05-11 MED ORDER — KETOROLAC TROMETHAMINE 30 MG/ML IJ SOLN
30.0000 mg | Freq: Once | INTRAMUSCULAR | Status: AC
  Administered 2014-05-11: 30 mg via INTRAVENOUS
  Filled 2014-05-11: qty 1

## 2014-05-11 MED ORDER — SODIUM CHLORIDE 0.9 % IV SOLN
INTRAVENOUS | Status: AC
  Administered 2014-05-11 – 2014-05-12 (×2): via INTRAVENOUS

## 2014-05-11 MED ORDER — ACETAMINOPHEN 650 MG RE SUPP: 650.0000 mg | Freq: Four times a day (QID) | RECTAL | Status: DC | PRN

## 2014-05-11 MED ORDER — ONDANSETRON HCL 4 MG/2ML IJ SOLN
INTRAMUSCULAR | Status: AC
  Administered 2014-05-11: 4 mg
  Filled 2014-05-11: qty 2

## 2014-05-11 MED ORDER — ENOXAPARIN SODIUM 40 MG/0.4ML ~~LOC~~ SOLN
40.0000 mg | SUBCUTANEOUS | Status: DC
  Filled 2014-05-11: qty 0.4

## 2014-05-11 MED ORDER — METOPROLOL TARTRATE 50 MG PO TABS
50.0000 mg | ORAL_TABLET | Freq: Two times a day (BID) | ORAL | Status: DC
  Administered 2014-05-11 – 2014-05-24 (×19): 50 mg via ORAL
  Filled 2014-05-11 (×28): qty 1

## 2014-05-11 MED ORDER — METOCLOPRAMIDE HCL 5 MG/ML IJ SOLN
10.0000 mg | Freq: Once | INTRAMUSCULAR | Status: AC
  Administered 2014-05-11: 10 mg via INTRAVENOUS
  Filled 2014-05-11: qty 2

## 2014-05-11 MED ORDER — HYDROMORPHONE HCL 1 MG/ML IJ SOLN
1.0000 mg | Freq: Once | INTRAMUSCULAR | Status: AC
  Administered 2014-05-11: 1 mg via INTRAVENOUS
  Filled 2014-05-11: qty 1

## 2014-05-11 MED ORDER — ONDANSETRON HCL 4 MG/2ML IJ SOLN
4.0000 mg | Freq: Four times a day (QID) | INTRAMUSCULAR | Status: DC | PRN
  Administered 2014-05-11 – 2014-05-17 (×10): 4 mg via INTRAVENOUS
  Filled 2014-05-11 (×11): qty 2

## 2014-05-11 MED ORDER — AMLODIPINE BESYLATE 10 MG PO TABS
10.0000 mg | ORAL_TABLET | Freq: Every morning | ORAL | Status: DC
  Administered 2014-05-11 – 2014-05-24 (×12): 10 mg via ORAL
  Filled 2014-05-11 (×14): qty 1

## 2014-05-11 MED ORDER — ENOXAPARIN SODIUM 60 MG/0.6ML ~~LOC~~ SOLN
60.0000 mg | SUBCUTANEOUS | Status: DC
  Administered 2014-05-11 – 2014-05-23 (×13): 60 mg via SUBCUTANEOUS
  Filled 2014-05-11 (×14): qty 0.6

## 2014-05-11 MED ORDER — SODIUM CHLORIDE 0.9 % IV BOLUS (SEPSIS)
1000.0000 mL | Freq: Once | INTRAVENOUS | Status: AC
  Administered 2014-05-11: 1000 mL via INTRAVENOUS

## 2014-05-11 MED ORDER — PROMETHAZINE HCL 25 MG/ML IJ SOLN
12.5000 mg | Freq: Once | INTRAMUSCULAR | Status: AC
  Administered 2014-05-11: 12.5 mg via INTRAVENOUS
  Filled 2014-05-11: qty 1

## 2014-05-11 MED ORDER — SODIUM CHLORIDE 0.9 % IV SOLN
INTRAVENOUS | Status: DC
  Administered 2014-05-11: 07:00:00 via INTRAVENOUS

## 2014-05-11 MED ORDER — SUCRALFATE 1 G PO TABS
1.0000 g | ORAL_TABLET | Freq: Three times a day (TID) | ORAL | Status: DC
  Administered 2014-05-11 – 2014-05-14 (×6): 1 g via ORAL
  Filled 2014-05-11 (×12): qty 1

## 2014-05-11 MED ORDER — PANTOPRAZOLE SODIUM 40 MG PO TBEC
40.0000 mg | DELAYED_RELEASE_TABLET | Freq: Two times a day (BID) | ORAL | Status: DC
  Administered 2014-05-11 – 2014-05-24 (×20): 40 mg via ORAL
  Filled 2014-05-11 (×30): qty 1

## 2014-05-11 MED ORDER — PROMETHAZINE HCL 25 MG PO TABS: 25.0000 mg | ORAL_TABLET | Freq: Three times a day (TID) | ORAL | Status: DC | PRN

## 2014-05-11 MED ORDER — GABAPENTIN 300 MG PO CAPS
300.0000 mg | ORAL_CAPSULE | Freq: Every day | ORAL | Status: DC
  Administered 2014-05-12 – 2014-05-23 (×6): 300 mg via ORAL
  Filled 2014-05-11 (×14): qty 1

## 2014-05-11 MED ORDER — HYDROMORPHONE HCL 1 MG/ML IJ SOLN
0.5000 mg | INTRAMUSCULAR | Status: DC | PRN
  Administered 2014-05-11 – 2014-05-15 (×14): 0.5 mg via INTRAVENOUS
  Filled 2014-05-11 (×14): qty 1

## 2014-05-11 MED ORDER — ACETAMINOPHEN 325 MG PO TABS
650.0000 mg | ORAL_TABLET | Freq: Four times a day (QID) | ORAL | Status: DC | PRN
  Administered 2014-05-11: 650 mg via ORAL
  Filled 2014-05-11: qty 2

## 2014-05-11 MED ORDER — INSULIN ASPART 100 UNIT/ML ~~LOC~~ SOLN
0.0000 [IU] | Freq: Three times a day (TID) | SUBCUTANEOUS | Status: DC
  Administered 2014-05-11 (×2): 2 [IU] via SUBCUTANEOUS
  Administered 2014-05-12: 1 [IU] via SUBCUTANEOUS
  Administered 2014-05-12: 3 [IU] via SUBCUTANEOUS
  Administered 2014-05-13 (×3): 2 [IU] via SUBCUTANEOUS
  Administered 2014-05-14: 1 [IU] via SUBCUTANEOUS
  Administered 2014-05-14: 2 [IU] via SUBCUTANEOUS
  Administered 2014-05-15: 1 [IU] via SUBCUTANEOUS
  Administered 2014-05-15: 2 [IU] via SUBCUTANEOUS
  Administered 2014-05-16: 1 [IU] via SUBCUTANEOUS
  Administered 2014-05-17: 3 [IU] via SUBCUTANEOUS
  Administered 2014-05-18: 1 [IU] via SUBCUTANEOUS
  Administered 2014-05-18: 2 [IU] via SUBCUTANEOUS
  Administered 2014-05-18: 1 [IU] via SUBCUTANEOUS
  Administered 2014-05-19: 2 [IU] via SUBCUTANEOUS
  Administered 2014-05-19: 1 [IU] via SUBCUTANEOUS
  Administered 2014-05-20: 2 [IU] via SUBCUTANEOUS
  Administered 2014-05-20: 1 [IU] via SUBCUTANEOUS
  Administered 2014-05-22: 3 [IU] via SUBCUTANEOUS
  Administered 2014-05-22: 5 [IU] via SUBCUTANEOUS
  Administered 2014-05-23: 2 [IU] via SUBCUTANEOUS
  Administered 2014-05-23: 1 [IU] via SUBCUTANEOUS
  Administered 2014-05-24: 2 [IU] via SUBCUTANEOUS
  Administered 2014-05-24: 1 [IU] via SUBCUTANEOUS

## 2014-05-11 MED ORDER — INSULIN GLARGINE 100 UNIT/ML ~~LOC~~ SOLN
15.0000 [IU] | Freq: Every day | SUBCUTANEOUS | Status: DC
  Administered 2014-05-11 – 2014-05-14 (×4): 15 [IU] via SUBCUTANEOUS
  Filled 2014-05-11 (×4): qty 0.15

## 2014-05-11 NOTE — ED Notes (Signed)
Pt actively vomiting at this time, also reports abdominal pain 10/10. EDP notified.

## 2014-05-11 NOTE — ED Notes (Signed)
Unsuccessful IV access attempts by 2 ED RN's and multiple by the IV team nurse. EDP made aware.

## 2014-05-11 NOTE — ED Notes (Signed)
PA updated.

## 2014-05-11 NOTE — Progress Notes (Addendum)
ANTICOAGULATION CONSULT NOTE - Initial Consult  Pharmacy Consult for lovenox Indication: VTE prophylaxis  No Known Allergies  Patient Measurements: Height: 6\' 3"  (190.5 cm) Weight: 276 lb (125.193 kg) IBW/kg (Calculated) : 84.5 BMI 34  Vital Signs: BP: 149/95 mmHg (03/29 0500) Pulse Rate: 107 (03/29 0500)  Labs:  Recent Labs  05/10/14 2146  HGB 14.9  HCT 43.5  PLT 307  CREATININE 1.16    Estimated Creatinine Clearance: 124.3 mL/min (by C-G formula based on Cr of 1.16).   Medical History: Past Medical History  Diagnosis Date  . Hypertension   . Diabetes mellitus without complication age 35    insulin requiring from the start  . Gastritis   . Gastroparesis 2012    100% gastric retention on 08/2013 GES  . Obesity   . Diabetic neuropathy   . GERD (gastroesophageal reflux disease)     Assessment: Patient is a 39 y.o M admitted to the ED with c/o N/V.  To start lovenox for VTE px.  Scr 1.16 (crcl~100)  Goal of Therapy:  Anti-Xa level 0.3-0.6 Monitor platelets by anticoagulation protocol: Yes   Plan:  - lovenox 60mg  SQ q24h (adjusted dose for high BMI) - cbc q72h - pharmacy will adjust dose if crcl<30  Jillian Warth P 05/11/2014,11:23 AM

## 2014-05-11 NOTE — Progress Notes (Signed)
Patient admitted earlier this morning with intractable nausea and vomiting, history of same with multiple hospitalizations. For full deteails please refer to H&P by Dr. Hal Hope.  - Continues to have nausea and abdominal pain this morning - continue supportive treatment with reglan, phenergan, IVF - continue Lantus at lower dose for now, increase to home dose if he starts to eat   Verba Ainley M. Cruzita Lederer, MD [facility] [phone]

## 2014-05-11 NOTE — Progress Notes (Signed)
Inpatient Diabetes Program Recommendations  AACE/ADA: New Consensus Statement on Inpatient Glycemic Control (2013)  Target Ranges:  Prepandial:   less than 140 mg/dL      Peak postprandial:   less than 180 mg/dL (1-2 hours)      Critically ill patients:  140 - 180 mg/dL   Reason for Visit: Elevated HgbA1C  Diabetes history: DM1 Outpatient Diabetes medications: Lantus 22 units QHS, Novolog 8 units tidwc Current orders for Inpatient glycemic control: ON HOLD - Lantus 15 units QAM, Novolog sensitive tidwc  Inpatient Diabetes Program Recommendations Insulin - Basal: Lantus 15 units QAM Correction (SSI): Novolog moderate Q4H. When po's begin, tidwc and hs Insulin - Meal Coverage: When eating well, Novolog 6 units tidwc HgbA1C: 9.2% - uncontrolled  Note: 6 admissions in past 6 months for same diagnosis. Pt non-compliant with meds, MD visits. On 3/25, kept appt at [facility] and has f/u appt 05/21/14.  Will discuss HgbA1C results with pt.  Will follow closely. Thank you. Lorenda Peck, RD, LDN, CDE Inpatient Diabetes Coordinator [phone]

## 2014-05-11 NOTE — ED Notes (Signed)
Patient continues to vomit. Patient upset he is not being given any narcotics at this time.

## 2014-05-11 NOTE — ED Provider Notes (Signed)
CSN: [id number]     Arrival date & time 05/10/14  2059 History   First MD Initiated Contact with Patient 05/11/14 0012     Chief Complaint  Patient presents with  . Vomiting      (Consider location/radiation/quality/duration/timing/severity/associated sxs/prior Treatment) Patient is a 39 y.o. male presenting with vomiting. The history is provided by the patient. No language interpreter was used.  Emesis Severity:  Severe Duration:  2 days Timing:  Constant Chronicity:  Recurrent Associated symptoms: abdominal pain   Associated symptoms: no chills, no diarrhea and no myalgias   Associated symptoms comment:  The patient has a known history of severe gastroparesis with recurrent intractable vomiting. He presents to the ED today with 2 days of vomiting like previous episodes. No fever. He complains of severe abdominal pain. No diarrhea. He denies bloody emesis.    Past Medical History  Diagnosis Date  . Hypertension   . Diabetes mellitus without complication age 75    insulin requiring from the start  . Gastritis   . Gastroparesis 2012    100% gastric retention on 08/2013 GES  . Obesity   . Diabetic neuropathy   . GERD (gastroesophageal reflux disease)    Past Surgical History  Procedure Laterality Date  . Cholecystectomy  2013  . Knee surgery Left   . Flexible sigmoidoscopy N/A 06/11/2013    Procedure: FLEXIBLE SIGMOIDOSCOPY;  Surgeon: [name], MD;  Location: [facility];  Service: Endoscopy;  Laterality: N/A;  . Esophagogastroduodenoscopy N/A 06/11/2013    Procedure: ESOPHAGOGASTRODUODENOSCOPY (EGD);  Surgeon: [name], MD;  Location: [facility];  Service: Endoscopy;  Laterality: N/A;   Family History  Problem Relation Age of Onset  . Diabetes Father    History  Substance Use Topics  . Smoking status: Never Smoker   . Smokeless tobacco: Never Used  . Alcohol Use: Yes     Comment: "none really" - 08/27/13    Review of Systems  Constitutional: Negative  for fever and chills.  Respiratory: Negative.   Gastrointestinal: Positive for nausea, vomiting and abdominal pain. Negative for diarrhea.  Musculoskeletal: Negative.  Negative for myalgias.  Skin: Negative.   Neurological: Negative.  Negative for syncope and weakness.      Allergies  Review of patient's allergies indicates no known allergies.  Home Medications   Prior to Admission medications   Medication Sig Start Date End Date Taking? Authorizing Provider  amLODipine (NORVASC) 10 MG tablet Take 1 tablet (10 mg total) by mouth every morning. 05/03/14  Yes [name], MD  gabapentin (NEURONTIN) 300 MG capsule Take 1 capsule (300 mg total) by mouth at bedtime. 05/07/14  Yes Tiffany Daneil Dan, PA-C  glucose blood (ONE TOUCH ULTRA TEST) test strip Use as instructed to check blood sugar once a day dx code 250.00 05/07/14  Yes Tiffany Daneil Dan, PA-C  insulin aspart (NOVOLOG) cartridge Inject 8 Units into the skin 3 (three) times daily with meals. 05/03/14  Yes [name], MD  insulin glargine (LANTUS) 100 UNIT/ML injection Inject 0.22 mLs (22 Units total) into the skin at bedtime. 05/07/14  Yes Brayton Caves, PA-C  Insulin Pen Needle 31G X 8 MM MISC Check blood sugar TID and QHS 05/07/14  Yes Tiffany Daneil Dan, PA-C  Lancets [facility] ULTRASOFT) lancets Use as instructed 05/07/14  Yes Tiffany Daneil Dan, PA-C  metoCLOPramide (REGLAN) 10 MG tablet Take 1 tablet (10 mg total) by mouth 3 (three) times daily before meals. For 1 week then as needed  05/07/14  Yes Tiffany Daneil Dan, PA-C  metoprolol (LOPRESSOR) 50 MG tablet Take 1 tablet (50 mg total) by mouth 2 (two) times daily. 05/07/14  Yes Tiffany Daneil Dan, PA-C  pantoprazole (PROTONIX) 40 MG tablet Take 1 tablet (40 mg total) by mouth 2 (two) times daily. 05/07/14  Yes Tiffany Daneil Dan, PA-C  promethazine (PHENERGAN) 25 MG tablet Take 1 tablet (25 mg total) by mouth every 8 (eight) hours as needed for nausea or vomiting. 05/07/14  Yes Brayton Caves, PA-C  sucralfate  (CARAFATE) 1 G tablet Take 1 tablet (1 g total) by mouth 3 (three) times daily with meals. 05/07/14  Yes Tiffany S Noel, PA-C   BP 177/85 mmHg  Pulse 112  Temp(Src) 98.5 F (36.9 C) (Oral)  Resp 26  SpO2 95% Physical Exam  Constitutional: He is oriented to person, place, and time. He appears well-developed and well-nourished.  Actively vomiting.  Neck: Normal range of motion.  Cardiovascular: Regular rhythm.  Tachycardia present.   Pulmonary/Chest: Effort normal. He has no wheezes. He has no rales.  Abdominal: Soft. There is tenderness.  Musculoskeletal: Normal range of motion.  Neurological: He is alert and oriented to person, place, and time.  Skin: Skin is warm and dry.    ED Course  Procedures (including critical care time) Labs Review Labs Reviewed  COMPREHENSIVE METABOLIC PANEL - Abnormal; Notable for the following:    Glucose, Bld 164 (*)    Total Protein 8.4 (*)    GFR calc non Af Amer 79 (*)    All other components within normal limits  CBC WITH DIFFERENTIAL/PLATELET  LIPASE, BLOOD  I-STAT TROPOININ, ED   Results for orders placed or performed during the hospital encounter of 05/10/14  CBC with Differential  Result Value Ref Range   WBC 9.2 4.0 - 10.5 K/uL   RBC 5.33 4.22 - 5.81 MIL/uL   Hemoglobin 14.9 13.0 - 17.0 g/dL   HCT 43.5 39.0 - 52.0 %   MCV 81.6 78.0 - 100.0 fL   MCH 28.0 26.0 - 34.0 pg   MCHC 34.3 30.0 - 36.0 g/dL   RDW 12.8 11.5 - 15.5 %   Platelets 307 150 - 400 K/uL   Neutrophils Relative % 62 43 - 77 %   Neutro Abs 5.6 1.7 - 7.7 K/uL   Lymphocytes Relative 31 12 - 46 %   Lymphs Abs 2.9 0.7 - 4.0 K/uL   Monocytes Relative 6 3 - 12 %   Monocytes Absolute 0.6 0.1 - 1.0 K/uL   Eosinophils Relative 1 0 - 5 %   Eosinophils Absolute 0.1 0.0 - 0.7 K/uL   Basophils Relative 0 0 - 1 %   Basophils Absolute 0.0 0.0 - 0.1 K/uL  Comprehensive metabolic panel  Result Value Ref Range   Sodium 143 135 - 145 mmol/L   Potassium 3.5 3.5 - 5.1 mmol/L    Chloride 105 96 - 112 mmol/L   CO2 26 19 - 32 mmol/L   Glucose, Bld 164 (H) 70 - 99 mg/dL   BUN 9 6 - 23 mg/dL   Creatinine, Ser 1.16 0.50 - 1.35 mg/dL   Calcium 9.5 8.4 - 10.5 mg/dL   Total Protein 8.4 (H) 6.0 - 8.3 g/dL   Albumin 4.8 3.5 - 5.2 g/dL   AST 32 0 - 37 U/L   ALT 23 0 - 53 U/L   Alkaline Phosphatase 92 39 - 117 U/L   Total Bilirubin 0.5 0.3 - 1.2 mg/dL  GFR calc non Af Amer 79 (L) >90 mL/min   GFR calc Af Amer >90 >90 mL/min   Anion gap 12 5 - 15  Lipase, blood  Result Value Ref Range   Lipase 19 11 - 59 U/L  I-stat troponin, ED (only if pt is 38 y.o. or older & pain is above umbilicus) - do not order at [facility]  Result Value Ref Range   Troponin i, poc 0.00 0.00 - 0.08 ng/mL   Comment 3             Imaging Review No results found.   EKG Interpretation   Date/Time:  Monday May 10 2014 21:11:10 EDT Ventricular Rate:  119 PR Interval:  176 QRS Duration: 78 QT Interval:  281 QTC Calculation: 395 R Axis:   71 Text Interpretation:  Sinus tachycardia Nonspecific T abnormalities,  lateral leads Baseline wander in lead(s) V3 No significant change was  found Confirmed by Florina Ou  MD, Jenny Reichmann ([id number]) on 05/11/2014 12:10:39 AM      MDM   Final diagnoses:  None    1. Gastroparesis  The patient continues to have vomiting through medications for pain and nausea. Chart reviewed. Noted that opiate medications should be avoided. Will continue to attempt to control vomiting. Reglan ordered. Will reassess.  Patient continues to have vomiting, no relief with Reglan. Phenergan IV ordered. Will reassess.   He is able to sleep intermittently but continues to wake with recurrent vomiting. Will admit for intractable vomiting.  Charlann Lange, PA-C 05/11/14 [place], MD 05/11/14 [phone]

## 2014-05-11 NOTE — ED Provider Notes (Signed)
  Physical Exam  BP 190/101 mmHg  Pulse 109  Temp(Src) 98.5 F (36.9 C) (Oral)  Resp 22  SpO2 100%  Physical Exam  Constitutional: He is oriented to person, place, and time. He appears well-developed and well-nourished. No distress.  HENT:  Head: Normocephalic and atraumatic.  Eyes: Right eye exhibits no discharge. Left eye exhibits no discharge. No scleral icterus.  Neck: Normal range of motion.  Pulmonary/Chest: Effort normal. No respiratory distress.  Musculoskeletal: Normal range of motion.  Neurological: He is alert and oriented to person, place, and time.  Skin: Skin is warm and dry. He is not diaphoretic.  Psychiatric: He has a normal mood and affect.  Nursing note and vitals reviewed.   ED Course  Angiocath insertion Date/Time: 05/11/2014 12:41 AM Performed by: Dahlia Bailiff Authorized by: Dahlia Bailiff Consent: Verbal consent obtained. Risks and benefits: risks, benefits and alternatives were discussed Consent given by: patient Patient identity confirmed: verbally with patient Time out: Immediately prior to procedure a "time out" was called to verify the correct patient, procedure, equipment, support staff and site/side marked as required. Preparation: Patient was prepped and draped in the usual sterile fashion. Local anesthesia used: no Patient sedated: no Patient tolerance: Patient tolerated the procedure well with no immediate complications Comments: External jugular insertion on left side    MDM I was asked by Dr. Florina Ou to perform a venous access on patient. Patient's nurses had attempted multiple times without success on peripheral IVs. Patient had a reasonably sized left EJ amenable to Angiocath insertion. Patient's EJ site was prepped using sterile ration, 20-gauge angiocatheter was inserted. Adequate saline flow with saline lock was attached, blood was easily drawn back and flushed.  Signed,  Dahlia Bailiff, PA-C 12:44 AM       Dahlia Bailiff, PA-C 05/11/14  [id number]  Shanon Rosser, MD 05/11/14 [phone]

## 2014-05-11 NOTE — ED Notes (Signed)
Patient laying on his side, resting quietly, eyes closed. Chest observed for rise and fall. Patient did not arouse when nurse entered room to start IV fluids.

## 2014-05-11 NOTE — ED Notes (Signed)
Dr. Kakrakandy at bedside. 

## 2014-05-11 NOTE — H&P (Signed)
[facility] History and Physical  Duane [name] [id number] DOB: 1975/05/22 DOA: 05/10/2014  Referring physician: ER physician. PCP: [name], MD   Chief Complaint: Nausea vomiting.  HPI: Duane [name] is a 39 y.o. male with history of diabetes mellitus type 1 who has had recent multiple admissions for gastroparesis related nausea vomiting presents to the ER because of worsening nausea vomiting since last evening. Patient states he has been unable to keep anything because of the vomiting. Patient also has been having some epigastric discomfort. Denies any blood in the vomitus denies any diarrhea. Denies any fever chills chest pain or shortness of breath. In the ER patient had no diplopia episodes of vomiting and has been admitted for further management of nausea vomiting most likely from gastroparesis. Patient has had gastric emptying study in July 2015 which was showing delayed gastric emptying. EGD done in April 2015 was unremarkable. Patient states that he had no refills on his Reglan and has not taken it for a couple of days.   Review of Systems: As presented in the history of presenting illness, rest negative.  Past Medical History  Diagnosis Date  . Hypertension   . Diabetes mellitus without complication age 4    insulin requiring from the start  . Gastritis   . Gastroparesis 2012    100% gastric retention on 08/2013 GES  . Obesity   . Diabetic neuropathy   . GERD (gastroesophageal reflux disease)    Past Surgical History  Procedure Laterality Date  . Cholecystectomy  2013  . Knee surgery Left   . Flexible sigmoidoscopy N/A 06/11/2013    Procedure: FLEXIBLE SIGMOIDOSCOPY;  Surgeon: [name], MD;  Location: [facility];  Service: Endoscopy;  Laterality: N/A;  . Esophagogastroduodenoscopy N/A 06/11/2013    Procedure: ESOPHAGOGASTRODUODENOSCOPY (EGD);  Surgeon: [name], MD;  Location: [facility];  Service: Endoscopy;  Laterality: N/A;   Social History:   reports that he has never smoked. He has never used smokeless tobacco. He reports that he drinks alcohol. He reports that he does not use illicit drugs. Where does patient live home. Can patient participate in ADLs? Yes.  No Known Allergies  Family History:  Family History  Problem Relation Age of Onset  . Diabetes Father       Prior to Admission medications   Medication Sig Start Date End Date Taking? Authorizing Provider  amLODipine (NORVASC) 10 MG tablet Take 1 tablet (10 mg total) by mouth every morning. 05/03/14  Yes [name], MD  gabapentin (NEURONTIN) 300 MG capsule Take 1 capsule (300 mg total) by mouth at bedtime. 05/07/14  Yes Tiffany Daneil Dan, PA-C  glucose blood (ONE TOUCH ULTRA TEST) test strip Use as instructed to check blood sugar once a day dx code 250.00 05/07/14  Yes Tiffany Daneil Dan, PA-C  insulin aspart (NOVOLOG) cartridge Inject 8 Units into the skin 3 (three) times daily with meals. 05/03/14  Yes [name], MD  insulin glargine (LANTUS) 100 UNIT/ML injection Inject 0.22 mLs (22 Units total) into the skin at bedtime. 05/07/14  Yes Brayton Caves, PA-C  Insulin Pen Needle 31G X 8 MM MISC Check blood sugar TID and QHS 05/07/14  Yes Tiffany Daneil Dan, PA-C  Lancets [facility] ULTRASOFT) lancets Use as instructed 05/07/14  Yes Tiffany Daneil Dan, PA-C  metoCLOPramide (REGLAN) 10 MG tablet Take 1 tablet (10 mg total) by mouth 3 (three) times daily before meals. For 1 week then as needed 05/07/14  Yes Brayton Caves, PA-C  metoprolol (LOPRESSOR) 50 MG tablet Take 1 tablet (50 mg total) by mouth 2 (two) times daily. 05/07/14  Yes Tiffany Daneil Dan, PA-C  pantoprazole (PROTONIX) 40 MG tablet Take 1 tablet (40 mg total) by mouth 2 (two) times daily. 05/07/14  Yes Tiffany Daneil Dan, PA-C  promethazine (PHENERGAN) 25 MG tablet Take 1 tablet (25 mg total) by mouth every 8 (eight) hours as needed for nausea or vomiting. 05/07/14  Yes Brayton Caves, PA-C  sucralfate (CARAFATE) 1 G tablet Take 1 tablet (1  g total) by mouth 3 (three) times daily with meals. 05/07/14  Yes Brayton Caves, PA-C    Physical Exam: Filed Vitals:   05/11/14 0104 05/11/14 0309 05/11/14 0403 05/11/14 0500  BP: 177/85 142/88 141/89 149/95  Pulse: 112 117  107  Temp:      TempSrc:      Resp: 26 24 28 18   SpO2: 95% 99%  97%     General:  Well-developed and nourished.  Eyes: Anicteric no pallor.  ENT: No discharge from the ears eyes nose or mouth.  Neck: No mass felt.  Cardiovascular: S1-S2 heard.  Respiratory: No rhonchi or crepitations.  Abdomen: Soft nontender bowel sounds present. No guarding or rigidity.  Skin: No rash.  Musculoskeletal: No edema.  Psychiatric: Appears normal.  Neurologic: Alert awake oriented to time place and person. Moves all extremities.  Labs on Admission:  Basic Metabolic Panel:  Recent Labs Lab 05/04/14 2315 05/05/14 0801 05/10/14 2146  NA 141 138 143  K 3.5 4.2 3.5  CL 103 103 105  CO2 25 25 26   GLUCOSE 176* 193* 164*  BUN 7 5* 9  CREATININE 1.28 1.21 1.16  CALCIUM 9.4 8.7 9.5   Liver Function Tests:  Recent Labs Lab 05/04/14 2315 05/10/14 2146  AST 24 32  ALT 24 23  ALKPHOS 83 92  BILITOT 0.7 0.5  PROT 7.4 8.4*  ALBUMIN 4.4 4.8    Recent Labs Lab 05/04/14 2315 05/10/14 2146  LIPASE 19 19   No results for input(s): AMMONIA in the last 168 hours. CBC:  Recent Labs Lab 05/04/14 2315 05/05/14 0801 05/10/14 2146  WBC 6.4 7.6 9.2  NEUTROABS 3.8  --  5.6  HGB 14.1 13.0 14.9  HCT 41.1 37.0* 43.5  MCV 80.0 80.3 81.6  PLT 253 260 307   Cardiac Enzymes: No results for input(s): CKTOTAL, CKMB, CKMBINDEX, TROPONINI in the last 168 hours.  BNP (last 3 results) No results for input(s): BNP in the last 8760 hours.  ProBNP (last 3 results) No results for input(s): PROBNP in the last 8760 hours.  CBG:  Recent Labs Lab 05/05/14 1206 05/05/14 1659 05/05/14 2142 05/06/14 0734 05/06/14 1138  GLUCAP 169* 154* 133* 146* 204*     Radiological Exams on Admission: No results found.  EKG: Independently reviewed. Sinus tachycardia.  Assessment/Plan Principal Problem:   Intractable vomiting with nausea Active Problems:   HTN (hypertension)   Diabetic gastroparesis   Essential hypertension   Nausea & vomiting   1. Intractable nausea and vomiting most likely secondary to diabetic gastroparesis - at this time I have ordered scheduled dose of Reglan 5 mg 2 more doses every 6 hourly and may order further doses based on patient's response. Continue Phenergan when necessary and will avoid narcotics. Check urine drug screen. Continue hydration. Patient has had gastric emptying study in July 2015 which was showing delayed gastric emptying and also had EGD in April 2015 which was unremarkable. 2. Diabetes mellitus  type 1 uncontrolled - patient's last hemoglobin A1c was 9.2 last week. Since patient is on clear liquid diet I have decrease patient's Lantus insulin from 22 units to 15 units and since patient did not take it last night I have ordered as a day dose. Closely follow CBGs. 3. Hypertension - on metoprolol and amlodipine. I have also place patient on when necessary IV hydralazine.   DVT Prophylaxis Lovenox.  Code Status: Full code.  Family Communication: None.  Disposition Plan: Admit to inpatient.    Dyan Labarbera N. [facility] Pager [phone].  If 7PM-7AM, please contact night-coverage [email or web address] Password [facility] 05/11/2014, 6:49 AM

## 2014-05-12 LAB — GLUCOSE, CAPILLARY
GLUCOSE-CAPILLARY: 159 mg/dL — AB (ref 70–99)
Glucose-Capillary: 115 mg/dL — ABNORMAL HIGH (ref 70–99)
Glucose-Capillary: 139 mg/dL — ABNORMAL HIGH (ref 70–99)
Glucose-Capillary: 201 mg/dL — ABNORMAL HIGH (ref 70–99)

## 2014-05-12 LAB — BASIC METABOLIC PANEL
Anion gap: 8 (ref 5–15)
BUN: 10 mg/dL (ref 6–23)
CHLORIDE: 105 mmol/L (ref 96–112)
CO2: 28 mmol/L (ref 19–32)
Calcium: 8.4 mg/dL (ref 8.4–10.5)
Creatinine, Ser: 1.05 mg/dL (ref 0.50–1.35)
GFR calc Af Amer: >90 mL/min (ref >90)
GFR calc non Af Amer: 89 mL/min — ABNORMAL LOW (ref >90)
Glucose, Bld: 143 mg/dL — ABNORMAL HIGH (ref 70–99)
Potassium: 3.8 mmol/L (ref 3.5–5.1)
SODIUM: 141 mmol/L (ref 135–145)

## 2014-05-12 LAB — CBC
HCT: 37.9 % — ABNORMAL LOW (ref 39.0–52.0)
HEMOGLOBIN: 12.5 g/dL — AB (ref 13.0–17.0)
MCH: 27.4 pg (ref 26.0–34.0)
MCHC: 33 g/dL (ref 30.0–36.0)
MCV: 83.1 fL (ref 78.0–100.0)
Platelets: 257 10*3/uL (ref 150–400)
RBC: 4.56 MIL/uL (ref 4.22–5.81)
RDW: 12.9 % (ref 11.5–15.5)
WBC: 8.6 10*3/uL (ref 4.0–10.5)

## 2014-05-12 MED ORDER — PROMETHAZINE HCL 25 MG/ML IJ SOLN
12.5000 mg | Freq: Once | INTRAMUSCULAR | Status: AC
  Administered 2014-05-12: 12.5 mg via INTRAVENOUS
  Filled 2014-05-12: qty 1

## 2014-05-12 NOTE — Progress Notes (Signed)
Patient Demographics  Duane [name], is a 39 y.o. male, DOB - [birth year], [id number]  Admit date - 05/10/2014   Admitting Physician Costin Karlyne Greenspan, MD  Outpatient Primary MD for the patient is NNODI, Doreene Burke, MD  LOS - 1   Chief Complaint  Patient presents with  . Vomiting       Admission history of present illness/brief narrative: Duane [name] is a 39 y.o. male with history of diabetes mellitus type 1 who has had recent multiple admissions for gastroparesis related nausea vomiting presents to the ER because of worsening nausea vomiting since last evening. Patient states he has been unable to keep anything because of the vomiting. Patient also has been having some epigastric discomfort. Denies any blood in the vomitus denies any diarrhea. Denies any fever chills chest pain or shortness of breath. In the ER patient had no  episodes of vomiting and has been admitted for further management of nausea vomiting most likely from gastroparesis. Patient has had gastric emptying study in July 2015 which was showing delayed gastric emptying. EGD done in April 2015 was unremarkable. Patient states that he had no refills on his Reglan and has not taken it for a couple of days.   Subjective:   Duane [name] today has, No headache, No chest pain, No abdominal pain - port some nausea, but no vomiting overnight, No new weakness tingling or numbness, No Cough - SOB.   Assessment & Plan    Principal Problem:   Intractable vomiting with nausea Active Problems:   HTN (hypertension)   Diabetic gastroparesis   Essential hypertension   Nausea & vomiting  Intractable nausea and vomiting secondary to diabetic gastroparesis - Appears to be improving, no further vomiting, but still has significant nausea. - Advanced to full liquid diet, will continue to advance as tolerated. -Patient  has had gastric emptying study in July 2015 which was showing delayed gastric emptying and also had EGD in April 2015 which was unremarkable. - Continue with scheduled Reglan, when necessary Phenergan   Diabetes mellitus type 1 - Uncontrolled, last hemoglobin A1c 9.2 last week - Currently CBGs are acceptable on reduced dose Lantus - Continue with insulin sliding scale  Hypertension - Continue with metoprolol, Norvasc, when necessary hydralazine   Code Status: Full  Family Communication: None at bedside  Disposition Plan: Home once stable   Procedures  None   Consults   None   Medications  Scheduled Meds: . amLODipine  10 mg Oral q morning - 10a  . enoxaparin (LOVENOX) injection  60 mg Subcutaneous Q24H  . gabapentin  300 mg Oral QHS  . insulin aspart  0-9 Units Subcutaneous TID WC  . insulin glargine  15 Units Subcutaneous Daily  . metoCLOPramide (REGLAN) injection  5 mg Intravenous 3 times per day  . metoprolol  50 mg Oral BID  . pantoprazole  40 mg Oral BID  . sucralfate  1 g Oral TID WC   Continuous Infusions:  PRN Meds:.acetaminophen **OR** acetaminophen, hydrALAZINE, HYDROmorphone (DILAUDID) injection, ondansetron **OR** ondansetron (ZOFRAN) IV, promethazine  DVT Prophylaxis  Lovenox -  Lab Results  Component Value Date   PLT 257 05/12/2014    Antibiotics    Anti-infectives    None  Objective:   Filed Vitals:   05/11/14 1432 05/11/14 2046 05/12/14 0049 05/12/14 0441  BP: 152/87 169/105 151/92 133/79  Pulse: 111 109  91  Temp: 98.1 F (36.7 C) 98.8 F (37.1 C)  99.1 F (37.3 C)  TempSrc: Oral Oral  Oral  Resp: 18 18  18   Height:      Weight:      SpO2: 98% 100%  99%    Wt Readings from Last 3 Encounters:  05/11/14 125.193 kg (276 lb)  05/07/14 126.1 kg (278 lb)  05/05/14 121.7 kg (268 lb 4.8 oz)     Intake/Output Summary (Last 24 hours) at 05/12/14 1152 Last data filed at 05/12/14 0859  Gross per 24 hour  Intake   2135  ml  Output      0 ml  Net   2135 ml     Physical Exam  Awake Alert, Oriented X 3, No new F.N deficits, Normal affect [employer].AT,PERRAL Supple Neck,No JVD, No cervical lymphadenopathy appriciated.  Symmetrical Chest wall movement, Good air movement bilaterally, CTAB RRR,No Gallops,Rubs or new Murmurs, No Parasternal Heave +ve B.Sounds, Abd Soft, No tenderness, No organomegaly appriciated, No rebound - guarding or rigidity. No Cyanosis, Clubbing or edema, No new Rash or bruise     Data Review   Micro Results No results found for this or any previous visit (from the past 240 hour(s)).  Radiology Reports No results found.  CBC  Recent Labs Lab 05/10/14 2146 05/11/14 1253 05/12/14 0400  WBC 9.2 13.0* 8.6  HGB 14.9 14.0 12.5*  HCT 43.5 41.0 37.9*  PLT 307 283 257  MCV 81.6 81.3 83.1  MCH 28.0 27.8 27.4  MCHC 34.3 34.1 33.0  RDW 12.8 13.0 12.9  LYMPHSABS 2.9 2.0  --   MONOABS 0.6 0.7  --   EOSABS 0.1 0.0  --   BASOSABS 0.0 0.0  --     Chemistries   Recent Labs Lab 05/10/14 2146 05/11/14 1253 05/12/14 0400  NA 143 140 141  K 3.5 4.0 3.8  CL 105 103 105  CO2 26 26 28   GLUCOSE 164* 211* 143*  BUN 9 9 10   CREATININE 1.16 1.01 1.05  CALCIUM 9.5 8.6 8.4  AST 32 25  --   ALT 23 22  --   ALKPHOS 92 73  --   BILITOT 0.5 0.8  --    ------------------------------------------------------------------------------------------------------------------ estimated creatinine clearance is 137.3 mL/min (by C-G formula based on Cr of 1.05). ------------------------------------------------------------------------------------------------------------------ No results for input(s): HGBA1C in the last 72 hours. ------------------------------------------------------------------------------------------------------------------ No results for input(s): CHOL, HDL, LDLCALC, TRIG, CHOLHDL, LDLDIRECT in the last 72  hours. ------------------------------------------------------------------------------------------------------------------ No results for input(s): TSH, T4TOTAL, T3FREE, THYROIDAB in the last 72 hours.  Invalid input(s): FREET3 ------------------------------------------------------------------------------------------------------------------ No results for input(s): VITAMINB12, FOLATE, FERRITIN, TIBC, IRON, RETICCTPCT in the last 72 hours.  Coagulation profile No results for input(s): INR, PROTIME in the last 168 hours.  No results for input(s): DDIMER in the last 72 hours.  Cardiac Enzymes No results for input(s): CKMB, TROPONINI, MYOGLOBIN in the last 168 hours.  Invalid input(s): CK ------------------------------------------------------------------------------------------------------------------ Invalid input(s): POCBNP     Time Spent in minutes   35 minutes  Morty Ortwein M.D on 05/12/2014 at 11:52 AM  Between 7am to 7pm - Pager - [phone]  After 7pm go to [email or web address] - password [email or web address]  And look for the night coverage person covering for me after hours  [facility] Office  [phone]   **Disclaimer: This note may  have been dictated with voice recognition software. Similar sounding words can inadvertently be transcribed and this note may contain transcription errors which may not have been corrected upon publication of note.**

## 2014-05-13 LAB — GLUCOSE, CAPILLARY
GLUCOSE-CAPILLARY: 207 mg/dL — AB (ref 70–99)
Glucose-Capillary: 184 mg/dL — ABNORMAL HIGH (ref 70–99)
Glucose-Capillary: 184 mg/dL — ABNORMAL HIGH (ref 70–99)
Glucose-Capillary: 186 mg/dL — ABNORMAL HIGH (ref 70–99)

## 2014-05-13 MED ORDER — INSULIN GLARGINE 100 UNIT/ML ~~LOC~~ SOLN: 18.0000 [IU] | Freq: Every day | SUBCUTANEOUS | Status: DC

## 2014-05-13 MED ORDER — PROMETHAZINE HCL 25 MG/ML IJ SOLN
12.5000 mg | Freq: Once | INTRAMUSCULAR | Status: AC
  Administered 2014-05-13: 12.5 mg via INTRAVENOUS
  Filled 2014-05-13: qty 1

## 2014-05-13 MED ORDER — INSULIN ASPART 100 UNIT/ML CARTRIDGE (PENFILL): 4.0000 [IU] | Freq: Three times a day (TID) | SUBCUTANEOUS | Status: DC

## 2014-05-13 NOTE — Care Management Note (Signed)
CARE MANAGEMENT NOTE 05/13/2014  Patient:  Duane [name], Duane [name]   Account Number:  [id number]  Date Initiated:  05/13/2014  Documentation initiated by:  Marney Doctor  Subjective/Objective Assessment:   39 yo admitted with Intractable Nausea and Vomitting.     Action/Plan:   From home with family   Anticipated DC Date:  05/13/2014   Anticipated DC Plan:  [place]  CM consult      Choice offered to / List presented to:             Status of service:  Completed, signed off Medicare Important Message given?   (If response is "NO", the following Medicare IM given date fields will be blank) Date Medicare IM given:   Medicare IM given by:   Date Additional Medicare IM given:   Additional Medicare IM given by:    Discharge Disposition:    Per UR Regulation:  Reviewed for med. necessity/level of care/duration of stay  If discussed at [place] of Stay Meetings, dates discussed:    Comments:  05/13/14 Marney Doctor RN,BSN,NCM [phone] Pt has had 3 hospital admissions recently for gastroparesis. I met with pt at bedside to discuss resources. Pt has an appointment at the [facility] on 05/21/14. Pt states he has a glucose meter at home and he recently got a new prescription for test strips and lancets that he needs to get filled. Pt also got prescriptions for his medications and insulin when he was recently in the hospital. Pt was instructed to take all his prescriptions over to the [facility] pharmacy when he's DC'd today to get his prescriptions. He was instructed that the [facility] pharmacy could potentially give him the meds for the lowest cost. He states he will do this. No other CM needs noted.

## 2014-05-13 NOTE — Progress Notes (Signed)
Pt vomited 300 cc undigested food. Notified Dr Waldron [facility] & discharge order cancelled. Taylen Osorto, [employer]

## 2014-05-13 NOTE — Discharge Summary (Addendum)
Duane [name], is a 39 y.o. male  DOB Jan 18, 1976  MRN [id number].  Admission date:  05/10/2014  Admitting Physician  Caren Griffins, MD  Discharge Date:  05/13/2014   Primary MD  [name], MD  Recommendations for primary care physician for things to follow:  - Check regular labs including CBC, BMP during next visit.  Patient reports he has enough medication as he recently received excellent with a match program, and this including insulin and antihypertensive medication    Admission Diagnosis  Intractable cyclical vomiting with nausea [G43.A1]   Discharge Diagnosis  Intractable cyclical vomiting with nausea [G43.A1]    Principal Problem:   Intractable vomiting with nausea Active Problems:   HTN (hypertension)   Diabetic gastroparesis   Essential hypertension   Nausea & vomiting      Past Medical History  Diagnosis Date  . Hypertension   . Diabetes mellitus without complication age 13    insulin requiring from the start  . Gastritis   . Gastroparesis 2012    100% gastric retention on 08/2013 [facility]  . Obesity   . Diabetic neuropathy   . GERD (gastroesophageal reflux disease)     Past Surgical History  Procedure Laterality Date  . Cholecystectomy  2013  . Knee surgery Left   . Flexible sigmoidoscopy N/A 06/11/2013    Procedure: FLEXIBLE SIGMOIDOSCOPY;  Surgeon: [name], MD;  Location: [facility]OSCOPY;  Service: Endoscopy;  Laterality: N/A;  . Esophagogastroduodenoscopy N/A 06/11/2013    Procedure: ESOPHAGOGASTRODUODENOSCOPY (EGD);  Surgeon: [name], MD;  Location: [facility];  Service: Endoscopy;  Laterality: N/A;       History of present illness and  Hospital Course:     Kindly see H&P for history of present illness and admission details, please review complete Labs, Consult  reports and Test reports for all details in brief  HPI  from the history and physical done on the day of admission  Duane [name] is a 39 y.o. male with history of diabetes mellitus type 1 who has had recent multiple admissions for gastroparesis related nausea vomiting presents to the ER because of worsening nausea vomiting since last evening. Patient states he has been unable to keep anything because of the vomiting. Patient also has been having some epigastric discomfort. Denies any blood in the vomitus denies any diarrhea. Denies any fever chills chest pain or shortness of breath. In the ER patient had no episodes of vomiting and has been admitted for further management of nausea vomiting most likely from gastroparesis. Patient has had gastric emptying study in July 2015 which was showing delayed gastric emptying. EGD done in April 2015 was unremarkable. Patient states that he had no refills on his Reglan and has not taken it for a couple of days.    Hospital Course   Intractable nausea and vomiting secondary to diabetic gastroparesis - Recurrent admission for gastroparesis. - Advanced to soft diet, which she tolerated very well, no nausea or vomiting -Patient has had gastric emptying study in  July 2015 which was showing delayed gastric emptying and also had EGD in April 2015 which was unremarkable. - Avoid opiates  Diabetes mellitus type 1 - Uncontrolled, last hemoglobin A1c 9.2 last week - Lantus home dose reduced from 22 to 18 units, and NovoLog reduced from 8 units to 4 units before meals on discharge.  Hypertension - Continue with metoprolol, Norvasc.    Discharge Condition:  Stable.   Follow UP  Follow-up Information    Follow up with NNODI, ADAKU, MD. Schedule an appointment as soon as possible for a visit in 1 week.   Specialty:  Family Medicine   Why:  Posthospitalization follow-up   Contact information:   [place] [address] [phone]           Discharge Instructions  and  Discharge Medications     Discharge Instructions    Discharge instructions    Complete by:  As directed   Follow with Primary MD Donnie Coffin, MD in 7 days   Get CBC, CMP, 2 view Chest X ray checked  by Primary MD next visit.    Activity: As tolerated with Full fall precautions use walker/cane & assistance as needed   Disposition Home    Diet: Soft diet, Heart Healthy , carbohydrate modified ,with feeding assistance and aspiration precautions as needed.  For Heart failure patients - Check your Weight same time everyday, if you gain over 2 pounds, or you develop in leg swelling, experience more shortness of breath or chest pain, call your Primary MD immediately. Follow Cardiac Low Salt Diet and 1.5 lit/day fluid restriction.   On your next visit with your primary care physician please Get Medicines reviewed and adjusted.   Please request your Prim.MD to go over all Hospital Tests and Procedure/Radiological results at the follow up, please get all Hospital records sent to your Prim MD by signing hospital release before you go home.   If you experience worsening of your admission symptoms, develop shortness of breath, life threatening emergency, suicidal or homicidal thoughts you must seek medical attention immediately by calling 911 or calling your MD immediately  if symptoms less severe.  You Must read complete instructions/literature along with all the possible adverse reactions/side effects for all the Medicines you take and that have been prescribed to you. Take any new Medicines after you have completely understood and accpet all the possible adverse reactions/side effects.   Do not drive, operating heavy machinery, perform activities at heights, swimming or participation in water activities or provide baby sitting services if your were admitted for syncope or siezures until you have seen by Primary MD or a Neurologist and advised to do so  again.  Do not drive when taking Pain medications.    Do not take more than prescribed Pain, Sleep and Anxiety Medications  Special Instructions: If you have smoked or chewed Tobacco  in the last 2 yrs please stop smoking, stop any regular Alcohol  and or any Recreational drug use.  Wear Seat belts while driving.   Please note  You were cared for by a hospitalist during your hospital stay. If you have any questions about your discharge medications or the care you received while you were in the hospital after you are discharged, you can call the unit and asked to speak with the hospitalist on call if the hospitalist that took care of you is not available. Once you are discharged, your primary care physician will handle any further medical issues.  Please note that NO REFILLS for any discharge medications will be authorized once you are discharged, as it is imperative that you return to your primary care physician (or establish a relationship with a primary care physician if you do not have one) for your aftercare needs so that they can reassess your need for medications and monitor your lab values.     Increase activity slowly    Complete by:  As directed             Medication List    TAKE these medications        amLODipine 10 MG tablet  Commonly known as:  NORVASC  Take 1 tablet (10 mg total) by mouth every morning.     gabapentin 300 MG capsule  Commonly known as:  NEURONTIN  Take 1 capsule (300 mg total) by mouth at bedtime.     glucose blood test strip  Commonly known as:  ONE TOUCH ULTRA TEST  Use as instructed to check blood sugar once a day dx code 250.00     insulin aspart cartridge  Commonly known as:  NOVOLOG  Inject 4 Units into the skin 3 (three) times daily with meals.     insulin glargine 100 UNIT/ML injection  Commonly known as:  LANTUS  Inject 0.18 mLs (18 Units total) into the skin at bedtime.     Insulin Pen Needle 31G X 8 MM Misc  Check blood sugar  TID and QHS     metoCLOPramide 10 MG tablet  Commonly known as:  REGLAN  Take 1 tablet (10 mg total) by mouth 3 (three) times daily before meals. For 1 week then as needed     metoprolol 50 MG tablet  Commonly known as:  LOPRESSOR  Take 1 tablet (50 mg total) by mouth 2 (two) times daily.     onetouch ultrasoft lancets  Use as instructed     pantoprazole 40 MG tablet  Commonly known as:  PROTONIX  Take 1 tablet (40 mg total) by mouth 2 (two) times daily.     promethazine 25 MG tablet  Commonly known as:  PHENERGAN  Take 1 tablet (25 mg total) by mouth every 8 (eight) hours as needed for nausea or vomiting.     sucralfate 1 G tablet  Commonly known as:  CARAFATE  Take 1 tablet (1 g total) by mouth 3 (three) times daily with meals.          Diet and Activity recommendation: See Discharge Instructions above   Consults obtained -    Major procedures and Radiology Reports - PLEASE review detailed and final reports for all details, in brief -      Dg Abd Acute W/chest  04/30/2014   CLINICAL DATA:  Acute onset of generalized abdominal pain and nausea. Initial encounter.  EXAM: ACUTE ABDOMEN SERIES (ABDOMEN 2 VIEW & CHEST 1 VIEW)  COMPARISON:  Chest radiograph performed 12/30/2013, and abdominal radiograph performed 02/03/2014  FINDINGS: The lungs are well-aerated and clear. There is no evidence of focal opacification, pleural effusion or pneumothorax. The cardiomediastinal silhouette is borderline enlarged.  The visualized bowel gas pattern is unremarkable. Scattered stool and air are seen within the colon; there is no evidence of small bowel dilatation to suggest obstruction. No free intra-abdominal air is identified on the provided upright view. Clips are noted within the right upper quadrant, reflecting prior cholecystectomy.  No acute osseous abnormalities are seen; the sacroiliac joints are unremarkable in appearance.  IMPRESSION: 1.  Unremarkable bowel gas pattern; no free  intra-abdominal air seen. Small to moderate amount of stool noted in the colon. 2. Borderline cardiomegaly; lungs remain clear.   Electronically Signed   By: Garald Balding M.D.   On: 04/30/2014 02:56    Micro Results     No results found for this or any previous visit (from the past 240 hour(s)).     Today   Subjective:   Duane [name] today has no headache,no chest abdominal pain,no new weakness tingling or numbness, tolerating soft diet, no further nausea or vomiting.  Objective:   Blood pressure 129/74, pulse 70, temperature 97.9 F (36.6 C), temperature source Oral, resp. rate 16, height 6\' 3"  (1.905 m), weight 125.193 kg (276 lb), SpO2 98 %.   Intake/Output Summary (Last 24 hours) at 05/13/14 1007 Last data filed at 05/12/14 1623  Gross per 24 hour  Intake    240 ml  Output      0 ml  Net    240 ml    Exam Awake Alert, Oriented x 3, No new F.N deficits, Normal affect [place].AT,PERRAL Supple Neck,No JVD, No cervical lymphadenopathy appriciated.  Symmetrical Chest wall movement, Good air movement bilaterally, CTAB RRR,No Gallops,Rubs or new Murmurs, No Parasternal Heave +ve B.Sounds, Abd Soft, Non tender, No organomegaly appriciated, No rebound -guarding or rigidity. No Cyanosis, Clubbing or edema, No new Rash or bruise  Data Review   CBC w Diff: Lab Results  Component Value Date   WBC 8.6 05/12/2014   HGB 12.5* 05/12/2014   HCT 37.9* 05/12/2014   PLT 257 05/12/2014   LYMPHOPCT 16 05/11/2014   MONOPCT 5 05/11/2014   EOSPCT 0 05/11/2014   BASOPCT 0 05/11/2014    CMP: Lab Results  Component Value Date   NA 141 05/12/2014   K 3.8 05/12/2014   CL 105 05/12/2014   CO2 28 05/12/2014   BUN 10 05/12/2014   CREATININE 1.05 05/12/2014   PROT 7.5 05/11/2014   ALBUMIN 4.0 05/11/2014   BILITOT 0.8 05/11/2014   ALKPHOS 73 05/11/2014   AST 25 05/11/2014   ALT 22 05/11/2014  .   Total Time in preparing paper work, data evaluation and todays exam - 35  minutes  Myrth Dahan M.D on 05/13/2014 at 10:07 AM  [facility]   Office  [phone]

## 2014-05-14 LAB — GLUCOSE, CAPILLARY
GLUCOSE-CAPILLARY: 139 mg/dL — AB (ref 70–99)
GLUCOSE-CAPILLARY: 65 mg/dL — AB (ref 70–99)
GLUCOSE-CAPILLARY: 204 mg/dL — AB (ref 70–99)
Glucose-Capillary: 191 mg/dL — ABNORMAL HIGH (ref 70–99)
Glucose-Capillary: 133 mg/dL — ABNORMAL HIGH (ref 70–99)

## 2014-05-14 MED ORDER — SUCRALFATE 1 GM/10ML PO SUSP
1.0000 g | Freq: Three times a day (TID) | ORAL | Status: DC
  Administered 2014-05-14 – 2014-05-24 (×22): 1 g via ORAL
  Filled 2014-05-14 (×32): qty 10

## 2014-05-14 MED ORDER — INSULIN GLARGINE 100 UNIT/ML ~~LOC~~ SOLN
18.0000 [IU] | Freq: Every day | SUBCUTANEOUS | Status: DC
  Administered 2014-05-15 – 2014-05-24 (×10): 18 [IU] via SUBCUTANEOUS
  Filled 2014-05-14 (×10): qty 0.18

## 2014-05-14 MED ORDER — SODIUM CHLORIDE 0.9 % IV BOLUS (SEPSIS)
500.0000 mL | Freq: Once | INTRAVENOUS | Status: AC
  Administered 2014-05-14: 500 mL via INTRAVENOUS

## 2014-05-14 MED ORDER — SODIUM CHLORIDE 0.9 % IV SOLN
INTRAVENOUS | Status: DC
  Administered 2014-05-14 – 2014-05-15 (×2): via INTRAVENOUS

## 2014-05-14 NOTE — Progress Notes (Signed)
Pt vomitted about 200cc of emesis. Notified  md.

## 2014-05-14 NOTE — Progress Notes (Signed)
Pt's CBG 60 , cranberry juice given, 15 min rechecked was 133. Will continue to monitor.

## 2014-05-14 NOTE — Progress Notes (Signed)
Patient Demographics  Duane [name], is a 39 y.o. male, DOB - 1975-08-09, [id number]  Admit date - 05/10/2014   Admitting Physician Costin Karlyne Greenspan, MD  Outpatient Primary MD for the patient is NNODI, Doreene Burke, MD  LOS - 3   Chief Complaint  Patient presents with  . Vomiting       Admission history of present illness/brief narrative: Duane [name] is a 39 y.o. male with history of diabetes mellitus type 1 who has had recent multiple admissions for gastroparesis related nausea vomiting presents to the ER because of worsening nausea vomiting since last evening. Patient states he has been unable to keep anything because of the vomiting. Patient also has been having some epigastric discomfort. Denies any blood in the vomitus denies any diarrhea. Denies any fever chills chest pain or shortness of breath. In the ER patient had no  episodes of vomiting and has been admitted for further management of nausea vomiting most likely from gastroparesis. Patient has had gastric emptying study in July 2015 which was showing delayed gastric emptying. EGD done in April 2015 was unremarkable. Patient states that he had no refills on his Reglan and has not taken it for a couple of days.   Subjective:   Duane [name] today has, No headache, No chest pain, No abdominal pain , No new weakness tingling or numbness, No Cough - SOB. Patient planned for discharge yesterday was canceled, as he redeveloped significant nausea and vomiting.  Assessment & Plan    Principal Problem:   Intractable vomiting with nausea Active Problems:   HTN (hypertension)   Diabetic gastroparesis   Essential hypertension   Nausea & vomiting  Intractable nausea and vomiting secondary to diabetic gastroparesis. - We'll change to full liquid diet. -Patient has had gastric emptying study in July 2015 which was  showing delayed gastric emptying and also had EGD in April 2015 which was unremarkable. - Continue with scheduled Reglan, when necessary Phenergan and Zofran.  Diabetes mellitus type 1 - Uncontrolled, last hemoglobin A1c 9.2 last week - Will increase Lantus to 18 units, as CBGs started to increase. - Continue with insulin sliding scale  Hypertension - Continue with metoprolol, Norvasc, when necessary hydralazine   Code Status: Full  Family Communication: None at bedside  Disposition Plan: Home once stable   Procedures  None   Consults   None   Medications  Scheduled Meds: . amLODipine  10 mg Oral q morning - 10a  . enoxaparin (LOVENOX) injection  60 mg Subcutaneous Q24H  . gabapentin  300 mg Oral QHS  . insulin aspart  0-9 Units Subcutaneous TID WC  . [START ON 05/15/2014] insulin glargine  18 Units Subcutaneous Daily  . metoCLOPramide (REGLAN) injection  5 mg Intravenous 3 times per day  . metoprolol  50 mg Oral BID  . pantoprazole  40 mg Oral BID  . sucralfate  1 g Oral TID WC   Continuous Infusions: . sodium chloride     PRN Meds:.acetaminophen **OR** acetaminophen, hydrALAZINE, HYDROmorphone (DILAUDID) injection, ondansetron **OR** ondansetron (ZOFRAN) IV, promethazine  DVT Prophylaxis  Lovenox -  Lab Results  Component Value Date   PLT 257 05/12/2014    Antibiotics    Anti-infectives    None  Objective:   Filed Vitals:   05/13/14 2131 05/14/14 0023 05/14/14 0309 05/14/14 0538  BP: 184/87 164/86 160/90 162/89  Pulse: 114 118 101 104  Temp: 98.2 F (36.8 C)   98 F (36.7 C)  TempSrc: Oral   Oral  Resp: 20   20  Height:      Weight:      SpO2: 100%   100%    Wt Readings from Last 3 Encounters:  05/11/14 125.193 kg (276 lb)  05/07/14 126.1 kg (278 lb)  05/05/14 121.7 kg (268 lb 4.8 oz)     Intake/Output Summary (Last 24 hours) at 05/14/14 1102 Last data filed at 05/14/14 0901  Gross per 24 hour  Intake    250 ml  Output     500 ml  Net   -250 ml     Physical Exam  Awake Alert, Oriented X 3, No new F.N deficits, Normal affect Duane [name].AT,PERRAL Supple Neck,No JVD, No cervical lymphadenopathy appriciated.  Symmetrical Chest wall movement, Good air movement bilaterally, CTAB RRR,No Gallops,Rubs or new Murmurs, No Parasternal Heave +ve B.Sounds, Abd Soft, No tenderness, No organomegaly appriciated, No rebound - guarding or rigidity. No Cyanosis, Clubbing or edema, No new Rash or bruise     Data Review   Micro Results No results found for this or any previous visit (from the past 240 hour(s)).  Radiology Reports No results found.  CBC  Recent Labs Lab 05/10/14 2146 05/11/14 1253 05/12/14 0400  WBC 9.2 13.0* 8.6  HGB 14.9 14.0 12.5*  HCT 43.5 41.0 37.9*  PLT 307 283 257  MCV 81.6 81.3 83.1  MCH 28.0 27.8 27.4  MCHC 34.3 34.1 33.0  RDW 12.8 13.0 12.9  LYMPHSABS 2.9 2.0  --   MONOABS 0.6 0.7  --   EOSABS 0.1 0.0  --   BASOSABS 0.0 0.0  --     Chemistries   Recent Labs Lab 05/10/14 2146 05/11/14 1253 05/12/14 0400  NA 143 140 141  K 3.5 4.0 3.8  CL 105 103 105  CO2 26 26 28   GLUCOSE 164* 211* 143*  BUN 9 9 10   CREATININE 1.16 1.01 1.05  CALCIUM 9.5 8.6 8.4  AST 32 25  --   ALT 23 22  --   ALKPHOS 92 73  --   BILITOT 0.5 0.8  --    ------------------------------------------------------------------------------------------------------------------ estimated creatinine clearance is 137.3 mL/min (by C-G formula based on Cr of 1.05). ------------------------------------------------------------------------------------------------------------------ No results for input(s): HGBA1C in the last 72 hours. ------------------------------------------------------------------------------------------------------------------ No results for input(s): CHOL, HDL, LDLCALC, TRIG, CHOLHDL, LDLDIRECT in the last 72  hours. ------------------------------------------------------------------------------------------------------------------ No results for input(s): TSH, T4TOTAL, T3FREE, THYROIDAB in the last 72 hours.  Invalid input(s): FREET3 ------------------------------------------------------------------------------------------------------------------ No results for input(s): VITAMINB12, FOLATE, FERRITIN, TIBC, IRON, RETICCTPCT in the last 72 hours.  Coagulation profile No results for input(s): INR, PROTIME in the last 168 hours.  No results for input(s): DDIMER in the last 72 hours.  Cardiac Enzymes No results for input(s): CKMB, TROPONINI, MYOGLOBIN in the last 168 hours.  Invalid input(s): CK ------------------------------------------------------------------------------------------------------------------ Invalid input(s): POCBNP     Time Spent in minutes   35 minutes  Macario Shear M.D on 05/14/2014 at 11:02 AM  Between 7am to 7pm - Pager - [phone]  After 7pm go to [email or web address] - password [email or web address]  And look for the night coverage person covering for me after hours  [facility] Office  [phone]   **Disclaimer: This note may have been dictated with  voice recognition software. Similar sounding words can inadvertently be transcribed and this note may contain transcription errors which may not have been corrected upon publication of note.**

## 2014-05-15 DIAGNOSIS — G43A1 Cyclical vomiting, intractable: Secondary | ICD-10-CM

## 2014-05-15 LAB — BASIC METABOLIC PANEL
ANION GAP: 7 (ref 5–15)
BUN: 11 mg/dL (ref 6–23)
CHLORIDE: 101 mmol/L (ref 96–112)
CO2: 29 mmol/L (ref 19–32)
CREATININE: 1.18 mg/dL (ref 0.50–1.35)
Calcium: 8.4 mg/dL (ref 8.4–10.5)
GFR calc Af Amer: 90 mL/min — ABNORMAL LOW (ref >90)
GFR calc non Af Amer: 77 mL/min — ABNORMAL LOW (ref >90)
GLUCOSE: 147 mg/dL — AB (ref 70–99)
Potassium: 3.6 mmol/L (ref 3.5–5.1)
Sodium: 137 mmol/L (ref 135–145)

## 2014-05-15 LAB — GLUCOSE, CAPILLARY
Glucose-Capillary: 118 mg/dL — ABNORMAL HIGH (ref 70–99)
Glucose-Capillary: 128 mg/dL — ABNORMAL HIGH (ref 70–99)
Glucose-Capillary: 169 mg/dL — ABNORMAL HIGH (ref 70–99)
Glucose-Capillary: 163 mg/dL — ABNORMAL HIGH (ref 70–99)

## 2014-05-15 MED ORDER — ERYTHROMYCIN LACTOBIONATE 500 MG IV SOLR
250.0000 mg | Freq: Four times a day (QID) | INTRAVENOUS | Status: DC
  Administered 2014-05-15: 250 mg via INTRAVENOUS
  Filled 2014-05-15 (×7): qty 5

## 2014-05-15 MED ORDER — LORAZEPAM 2 MG/ML IJ SOLN
1.0000 mg | Freq: Once | INTRAMUSCULAR | Status: AC
  Administered 2014-05-15: 1 mg via INTRAVENOUS
  Filled 2014-05-15: qty 1

## 2014-05-15 MED ORDER — METOCLOPRAMIDE HCL 5 MG/ML IJ SOLN
5.0000 mg | Freq: Four times a day (QID) | INTRAMUSCULAR | Status: DC
  Administered 2014-05-15 – 2014-05-17 (×6): 5 mg via INTRAVENOUS
  Filled 2014-05-15: qty 1
  Filled 2014-05-15: qty 2
  Filled 2014-05-15 (×6): qty 1
  Filled 2014-05-15: qty 2
  Filled 2014-05-15: qty 1

## 2014-05-15 MED ORDER — PROMETHAZINE HCL 25 MG/ML IJ SOLN
25.0000 mg | Freq: Four times a day (QID) | INTRAMUSCULAR | Status: DC | PRN
  Administered 2014-05-15 – 2014-05-16 (×3): 25 mg via INTRAVENOUS
  Filled 2014-05-15 (×3): qty 1

## 2014-05-15 MED ORDER — OXYCODONE HCL 5 MG PO TABS
5.0000 mg | ORAL_TABLET | ORAL | Status: DC | PRN
  Administered 2014-05-15 (×2): 5 mg via ORAL
  Filled 2014-05-15 (×2): qty 1

## 2014-05-15 NOTE — Progress Notes (Signed)
Patient Demographics  Duane [name], is a 39 y.o. male, DOB - Dec 28, 1975, [id number]  Admit date - 05/10/2014   Admitting Physician Costin Karlyne Greenspan, MD  Outpatient Primary MD for the patient is NNODI, Doreene Burke, MD  LOS - 4   Chief Complaint  Patient presents with  . Vomiting       Admission history of present illness/brief narrative: Duane [name] is a 39 y.o. male with history of diabetes mellitus type 1 who has had recent multiple admissions for gastroparesis related nausea vomiting presents to the ER because of worsening nausea vomiting since last evening. Patient states he has been unable to keep anything because of the vomiting. Patient also has been having some epigastric discomfort. Denies any blood in the vomitus denies any diarrhea. Denies any fever chills chest pain or shortness of breath. In the ER patient had no  episodes of vomiting and has been admitted for further management of nausea vomiting most likely from gastroparesis. Patient has had gastric emptying study in July 2015 which was showing delayed gastric emptying. EGD done in April 2015 was unremarkable. Patient states that he had no refills on his Reglan and has not taken it for a couple of days.   Subjective:   Duane [name] today has, No headache, No chest pain, No abdominal pain , No new weakness tingling or numbness, No Cough - SOB. Patient planned for discharge yesterday was canceled, as he redeveloped significant nausea and vomiting.  Assessment & Plan    Principal Problem:   Intractable vomiting with nausea Active Problems:   HTN (hypertension)   Diabetic gastroparesis   Essential hypertension   Nausea & vomiting  Intractable nausea and vomiting secondary to diabetic gastroparesis. -Patient has had gastric emptying study in July 2015 which was showing delayed gastric emptying and also  had EGD in April 2015 which was unremarkable. - Still having significant nausea and vomiting, will increase Reglan to 5 mg every 6 hours, will start on IV erythromycin as well, Continue  when necessary Phenergan and Zofran.  Diabetes mellitus type 1 - Uncontrolled, last hemoglobin A1c 9.2 last week - CBG is acceptable on Lantus to 18 units. - Continue with insulin sliding scale  Hypertension - Continue with metoprolol, Norvasc, when necessary hydralazine   Code Status: Full  Family Communication: None at bedside  Disposition Plan: Home once stable   Procedures  None   Consults   None   Medications  Scheduled Meds: . amLODipine  10 mg Oral q morning - 10a  . enoxaparin (LOVENOX) injection  60 mg Subcutaneous Q24H  . erythromycin  250 mg Intravenous 4 times per day  . gabapentin  300 mg Oral QHS  . insulin aspart  0-9 Units Subcutaneous TID WC  . insulin glargine  18 Units Subcutaneous Daily  . metoCLOPramide (REGLAN) injection  5 mg Intravenous 4 times per day  . metoprolol  50 mg Oral BID  . pantoprazole  40 mg Oral BID  . sucralfate  1 g Oral TID WC   Continuous Infusions: . sodium chloride 50 mL/hr at 05/15/14 1545   PRN Meds:.acetaminophen **OR** acetaminophen, hydrALAZINE, ondansetron **OR** ondansetron (ZOFRAN) IV, oxyCODONE, promethazine  DVT Prophylaxis  Lovenox -  Lab Results  Component Value Date  PLT 257 05/12/2014    Antibiotics    Anti-infectives    Start     Dose/Rate Route Frequency Ordered Stop   05/15/14 1415  erythromycin 250 mg in sodium chloride 0.9 % 100 mL IVPB     250 mg 100 mL/hr over 60 Minutes Intravenous 4 times per day 05/15/14 1414            Objective:   Filed Vitals:   05/14/14 0538 05/14/14 1248 05/14/14 2031 05/15/14 0444  BP: 162/89 148/76 154/90 143/85  Pulse: 104 104 105 81  Temp: 98 F (36.7 C) 98.9 F (37.2 C) 98.8 F (37.1 C) 98.3 F (36.8 C)  TempSrc: Oral Oral Oral Oral  Resp: 20 19 20 18   Height:       Weight:      SpO2: 100% 100% 98% 97%    Wt Readings from Last 3 Encounters:  05/11/14 125.193 kg (276 lb)  05/07/14 126.1 kg (278 lb)  05/05/14 121.7 kg (268 lb 4.8 oz)     Intake/Output Summary (Last 24 hours) at 05/15/14 1704 Last data filed at 05/15/14 1325  Gross per 24 hour  Intake    980 ml  Output      0 ml  Net    980 ml     Physical Exam  Awake Alert, Oriented X 3, No new F.N deficits, Normal affect Duane [name],Duane [name] Supple Neck,No JVD, No cervical lymphadenopathy appriciated.  Symmetrical Chest wall movement, Good air movement bilaterally, CTAB RRR,No Gallops,Rubs or new Murmurs, No Parasternal Heave +ve B.Sounds, Abd Soft, No tenderness, No organomegaly appriciated, No rebound - guarding or rigidity. No Cyanosis, Clubbing or edema, No new Rash or bruise     Data Review   Micro Results No results found for this or any previous visit (from the past 240 hour(s)).  Radiology Reports No results found.  CBC  Recent Labs Lab 05/10/14 2146 05/11/14 1253 05/12/14 0400  WBC 9.2 13.0* 8.6  HGB 14.9 14.0 12.5*  HCT 43.5 41.0 37.9*  PLT 307 283 257  MCV 81.6 81.3 83.1  MCH 28.0 27.8 27.4  MCHC 34.3 34.1 33.0  RDW 12.8 13.0 12.9  LYMPHSABS 2.9 2.0  --   MONOABS 0.6 0.7  --   EOSABS 0.1 0.0  --   BASOSABS 0.0 0.0  --     Chemistries   Recent Labs Lab 05/10/14 2146 05/11/14 1253 05/12/14 0400 05/15/14 0625  NA 143 140 141 137  K 3.5 4.0 3.8 3.6  CL 105 103 105 101  CO2 26 26 28 29   GLUCOSE 164* 211* 143* 147*  BUN 9 9 10 11   CREATININE 1.16 1.01 1.05 1.18  CALCIUM 9.5 8.6 8.4 8.4  AST 32 25  --   --   ALT 23 22  --   --   ALKPHOS 92 73  --   --   BILITOT 0.5 0.8  --   --    ------------------------------------------------------------------------------------------------------------------ estimated creatinine clearance is 122.2 mL/min (by C-G formula based on Cr of  1.18). ------------------------------------------------------------------------------------------------------------------ No results for input(s): HGBA1C in the last 72 hours. ------------------------------------------------------------------------------------------------------------------ No results for input(s): CHOL, HDL, LDLCALC, TRIG, CHOLHDL, LDLDIRECT in the last 72 hours. ------------------------------------------------------------------------------------------------------------------ No results for input(s): TSH, T4TOTAL, T3FREE, THYROIDAB in the last 72 hours.  Invalid input(s): FREET3 ------------------------------------------------------------------------------------------------------------------ No results for input(s): VITAMINB12, FOLATE, FERRITIN, TIBC, IRON, RETICCTPCT in the last 72 hours.  Coagulation profile No results for input(s): INR, PROTIME in the last 168 hours.  No results  for input(s): DDIMER in the last 72 hours.  Cardiac Enzymes No results for input(s): CKMB, TROPONINI, MYOGLOBIN in the last 168 hours.  Invalid input(s): CK ------------------------------------------------------------------------------------------------------------------ Invalid input(s): POCBNP     Time Spent in minutes   35 minutes  Adoria Kawamoto M.D on 05/15/2014 at 5:04 PM  Between 7am to 7pm - Pager - [phone]  After 7pm go to [email or web address] - password [email or web address]  And look for the night coverage person covering for me after hours  [facility] Office  [phone]   **Disclaimer: This note may have been dictated with voice recognition software. Similar sounding words can inadvertently be transcribed and this note may contain transcription errors which may not have been corrected upon publication of note.**

## 2014-05-15 NOTE — Progress Notes (Signed)
Pt complaining of pain and insisting that he cannot take PO medications and must have something IV for his pain. Discussed with pt that MD has made it clear that he cannot have IV narcotics because they will exacerbate his gastroparesis. Pt demanding that I call the doctor and ask for IV pain med. I explained that the MD is not here at this time but an on call NP is, and I paged her per his request. NP agreed that we are not going to order any IV narcotics at this time. She was agreeable to give the pt ativan IV to help him get the oxy IR down and to stay down since he is vomiting at this time. Ativan given to pt per order, he repeatedly states it is not going to work. I told him to call me when he felt ready to take the pill. Hortencia Conradi RN

## 2014-05-16 LAB — GLUCOSE, CAPILLARY
GLUCOSE-CAPILLARY: 124 mg/dL — AB (ref 70–99)
GLUCOSE-CAPILLARY: 110 mg/dL — AB (ref 70–99)
GLUCOSE-CAPILLARY: 123 mg/dL — AB (ref 70–99)
Glucose-Capillary: 125 mg/dL — ABNORMAL HIGH (ref 70–99)

## 2014-05-16 MED ORDER — GLUCERNA SHAKE PO LIQD
237.0000 mL | Freq: Three times a day (TID) | ORAL | Status: DC
  Administered 2014-05-16 – 2014-05-21 (×5): 237 mL via ORAL
  Filled 2014-05-16 (×26): qty 237

## 2014-05-16 MED ORDER — LORAZEPAM 2 MG/ML IJ SOLN
0.5000 mg | Freq: Once | INTRAMUSCULAR | Status: AC
  Administered 2014-05-16: 0.5 mg via INTRAVENOUS
  Filled 2014-05-16: qty 1

## 2014-05-16 MED ORDER — PROMETHAZINE HCL 25 MG PO TABS: 25.0000 mg | ORAL_TABLET | Freq: Four times a day (QID) | ORAL | Status: DC | PRN

## 2014-05-16 NOTE — Progress Notes (Signed)
IV accessed lost. IV team ordered to restart IV. Pt vomiting beige emesis with some undigested food. Pt continues to ask for pain medication IV. Explained he only has oral pain medication ordered. Report given to night nurse.

## 2014-05-16 NOTE — Plan of Care (Signed)
Problem: Phase II Progression Outcomes Goal: Discharge plan established Outcome: Completed/Met Date Met:  05/16/14 Pt has been discharged, he doesn't feel he's ready. Pt sleeping. NO attempt made to get ready to go home. Given Glycerma,took a couple sips and ate some fruit. States he just doesn't feel good. Dr. Waldron Labs aware,he has spoken to the pt. Twice.

## 2014-05-16 NOTE — Progress Notes (Addendum)
Patient Demographics  Duane [name], is a 39 y.o. male, DOB - [birth year], [id number]  Admit date - 05/10/2014   Admitting Physician Costin Karlyne Greenspan, MD  Outpatient Primary MD for the patient is NNODI, Doreene Burke, MD  LOS - 5   Chief Complaint  Patient presents with  . Vomiting       Admission history of present illness/brief narrative: Duane [name] is a 39 y.o. male with history of diabetes mellitus type 1 who has had recent multiple admissions for gastroparesis related nausea vomiting presents to the ER because of worsening nausea vomiting since last evening. Patient states he has been unable to keep anything because of the vomiting. Patient also has been having some epigastric discomfort. Denies any blood in the vomitus denies any diarrhea. Denies any fever chills chest pain or shortness of breath. In the ER patient had no  episodes of vomiting and has been admitted for further management of nausea vomiting most likely from gastroparesis. Patient has had gastric emptying study in July 2015 which was showing delayed gastric emptying. EGD done in April 2015 was unremarkable. Patient states that he had no refills on his Reglan and has not taken it for a couple of days.   Subjective:   [name] today has, No headache, No chest pain, No abdominal pain , No new weakness tingling or numbness, No Cough - SOB. Patient planned for discharge yesterday was canceled, as he redeveloped significant nausea and vomiting.  Assessment & Plan    Principal Problem:   Intractable vomiting with nausea Active Problems:   HTN (hypertension)   Diabetic gastroparesis   Essential hypertension   Nausea & vomiting  Intractable nausea and vomiting secondary to diabetic gastroparesis. -Patient has had gastric emptying study in July 2015 which was showing delayed gastric emptying and also  had EGD in April 2015 which was unremarkable. - Still having significant nausea and vomiting, on increase Reglan to 5 mg every 6 hours,  Continue  when necessary Phenergan and Zofran, patients report erythromycin MIC favorable nauseous, so it was stopped. -  patient keep requesting IV pain medication, explained to the patient that opioids make his gastroparesis worse. - Plan for discharge this a.m. was counseled, as patient had another episode of vomiting .  Diabetes mellitus type 1 - Uncontrolled, last hemoglobin A1c 9.2 last week - CBG is acceptable on Lantus to 18 units. - Continue with insulin sliding scale  Hypertension - Continue with metoprolol, Norvasc, when necessary hydralazine   Code Status: Full  Family Communication: None at bedside  Disposition Plan: Home once stable   Procedures  None   Consults   None   Medications  Scheduled Meds: . amLODipine  10 mg Oral q morning - 10a  . enoxaparin (LOVENOX) injection  60 mg Subcutaneous Q24H  . feeding supplement (GLUCERNA SHAKE)  237 mL Oral TID BM  . gabapentin  300 mg Oral QHS  . insulin aspart  0-9 Units Subcutaneous TID WC  . insulin glargine  18 Units Subcutaneous Daily  . metoCLOPramide (REGLAN) injection  5 mg Intravenous 4 times per day  . metoprolol  50 mg Oral BID  . pantoprazole  40 mg Oral BID  . sucralfate  1 g Oral TID WC  Continuous Infusions:   PRN Meds:.acetaminophen **OR** acetaminophen, hydrALAZINE, ondansetron **OR** ondansetron (ZOFRAN) IV, oxyCODONE, promethazine  DVT Prophylaxis  Lovenox -  Lab Results  Component Value Date   PLT 257 05/12/2014    Antibiotics    Anti-infectives    Start     Dose/Rate Route Frequency Ordered Stop   05/15/14 1415  erythromycin 250 mg in sodium chloride 0.9 % 100 mL IVPB  Status:  Discontinued     250 mg 100 mL/hr over 60 Minutes Intravenous 4 times per day 05/15/14 1414 05/16/14 1627          Objective:   Filed Vitals:   05/16/14 0519  05/16/14 1013 05/16/14 1018 05/16/14 1429  BP: 152/90 148/99 148/99 138/78  Pulse: 90 100  99  Temp: 98.5 F (36.9 C)   98.7 F (37.1 C)  TempSrc: Oral   Oral  Resp: 18   18  Height:      Weight:      SpO2: 98%   98%    Wt Readings from Last 3 Encounters:  05/11/14 125.193 kg (276 lb)  05/07/14 126.1 kg (278 lb)  05/05/14 121.7 kg (268 lb 4.8 oz)     Intake/Output Summary (Last 24 hours) at 05/16/14 1627 Last data filed at 05/16/14 1000  Gross per 24 hour  Intake 3041.67 ml  Output    600 ml  Net 2441.67 ml     Physical Exam  Awake Alert, Oriented X 3, No new F.N deficits, Normal affect .AT,PERRAL Supple Neck,No JVD, No cervical lymphadenopathy appriciated.  Symmetrical Chest wall movement, Good air movement bilaterally, CTAB RRR,No Gallops,Rubs or new Murmurs, No Parasternal Heave +ve B.Sounds, Abd Soft, No tenderness, No organomegaly appriciated, No rebound - guarding or rigidity. No Cyanosis, Clubbing or edema, No new Rash or bruise     Data Review   Micro Results No results found for this or any previous visit (from the past 240 hour(s)).  Radiology Reports No results found.  CBC  Recent Labs Lab 05/10/14 2146 05/11/14 1253 05/12/14 0400  WBC 9.2 13.0* 8.6  HGB 14.9 14.0 12.5*  HCT 43.5 41.0 37.9*  PLT 307 283 257  MCV 81.6 81.3 83.1  MCH 28.0 27.8 27.4  MCHC 34.3 34.1 33.0  RDW 12.8 13.0 12.9  LYMPHSABS 2.9 2.0  --   MONOABS 0.6 0.7  --   EOSABS 0.1 0.0  --   BASOSABS 0.0 0.0  --     Chemistries   Recent Labs Lab 05/10/14 2146 05/11/14 1253 05/12/14 0400 05/15/14 0625  NA 143 140 141 137  K 3.5 4.0 3.8 3.6  CL 105 103 105 101  CO2 26 26 28 29   GLUCOSE 164* 211* 143* 147*  BUN 9 9 10 11   CREATININE 1.16 1.01 1.05 1.18  CALCIUM 9.5 8.6 8.4 8.4  AST 32 25  --   --   ALT 23 22  --   --   ALKPHOS 92 73  --   --   BILITOT 0.5 0.8  --   --     ------------------------------------------------------------------------------------------------------------------ estimated creatinine clearance is 122.2 mL/min (by C-G formula based on Cr of 1.18). ------------------------------------------------------------------------------------------------------------------ No results for input(s): HGBA1C in the last 72 hours. ------------------------------------------------------------------------------------------------------------------ No results for input(s): CHOL, HDL, LDLCALC, TRIG, CHOLHDL, LDLDIRECT in the last 72 hours. ------------------------------------------------------------------------------------------------------------------ No results for input(s): TSH, T4TOTAL, T3FREE, THYROIDAB in the last 72 hours.  Invalid input(s): FREET3 ------------------------------------------------------------------------------------------------------------------ No results for input(s): VITAMINB12, FOLATE, FERRITIN, TIBC, IRON, RETICCTPCT in  the last 72 hours.  Coagulation profile No results for input(s): INR, PROTIME in the last 168 hours.  No results for input(s): DDIMER in the last 72 hours.  Cardiac Enzymes No results for input(s): CKMB, TROPONINI, MYOGLOBIN in the last 168 hours.  Invalid input(s): CK ------------------------------------------------------------------------------------------------------------------ Invalid input(s): POCBNP     Time Spent in minutes   35 minutes  Leveta Wahab M.D on 05/16/2014 at 4:27 PM  Between 7am to 7pm - Pager - [phone]  After 7pm go to [email or web address] - password [email or web address]  And look for the night coverage person covering for me after hours  [facility] Office  [phone]   **Disclaimer: This note may have been dictated with voice recognition software. Similar sounding words can inadvertently be transcribed and this note may contain transcription errors which may not have been  corrected upon publication of note.**

## 2014-05-17 DIAGNOSIS — K3184 Gastroparesis: Secondary | ICD-10-CM

## 2014-05-17 LAB — CBC
HCT: 39.4 % (ref 39.0–52.0)
HEMOGLOBIN: 13.3 g/dL (ref 13.0–17.0)
MCH: 27.4 pg (ref 26.0–34.0)
MCHC: 33.8 g/dL (ref 30.0–36.0)
MCV: 81.2 fL (ref 78.0–100.0)
Platelets: 275 10*3/uL (ref 150–400)
RBC: 4.85 MIL/uL (ref 4.22–5.81)
RDW: 12.2 % (ref 11.5–15.5)
WBC: 7.7 10*3/uL (ref 4.0–10.5)

## 2014-05-17 LAB — GLUCOSE, CAPILLARY
GLUCOSE-CAPILLARY: 123 mg/dL — AB (ref 70–99)
Glucose-Capillary: 99 mg/dL (ref 70–99)
Glucose-Capillary: 109 mg/dL — ABNORMAL HIGH (ref 70–99)
Glucose-Capillary: 202 mg/dL — ABNORMAL HIGH (ref 70–99)

## 2014-05-17 MED ORDER — ONDANSETRON HCL 4 MG/2ML IJ SOLN
4.0000 mg | Freq: Four times a day (QID) | INTRAMUSCULAR | Status: DC
  Administered 2014-05-17 – 2014-05-18 (×5): 4 mg via INTRAVENOUS
  Filled 2014-05-17 (×5): qty 2

## 2014-05-17 MED ORDER — LORAZEPAM 2 MG/ML PO CONC: 0.5000 mg | Freq: Three times a day (TID) | ORAL | Status: DC | PRN

## 2014-05-17 MED ORDER — SODIUM CHLORIDE 0.9 % IV SOLN
250.0000 mg | Freq: Four times a day (QID) | INTRAVENOUS | Status: DC
  Administered 2014-05-17 – 2014-05-21 (×16): 250 mg via INTRAVENOUS
  Filled 2014-05-17 (×19): qty 5

## 2014-05-17 NOTE — Progress Notes (Signed)
Patient Demographics  Duane [name], is a 39 y.o. male, DOB - 26-Sep-1975, [id number]  Admit date - 05/10/2014   Admitting Physician Costin Karlyne Greenspan, MD  Outpatient Primary MD for the patient is NNODI, Doreene Burke, MD  LOS - 6   Chief Complaint  Patient presents with  . Vomiting       Admission history of present illness/brief narrative: Duane [name] is a 39 y.o. male with history of diabetes mellitus type 1 who has had recent multiple admissions for gastroparesis related nausea vomiting presents to the ER because of worsening nausea vomiting since last evening. Patient states he has been unable to keep anything because of the vomiting. Patient also has been having some epigastric discomfort. Denies any blood in the vomitus denies any diarrhea. Denies any fever chills chest pain or shortness of breath. In the ER patient had no  episodes of vomiting and has been admitted for further management of nausea vomiting most likely from gastroparesis. Patient has had gastric emptying study in July 2015 which was showing delayed gastric emptying. EGD done in April 2015 was unremarkable. Patient states that he had no refills on his Reglan and has not taken it for a couple of days.   Subjective:   Duane [name] today has, No headache, No chest pain, No abdominal pain , No new weakness tingling or numbness, No Cough - SOB. Patient planned for discharge yesterday was canceled, as he redeveloped significant nausea and vomiting.  Assessment & Plan    Principal Problem:   Intractable vomiting with nausea Active Problems:   HTN (hypertension)   Diabetic gastroparesis   Essential hypertension   Nausea & vomiting  Intractable nausea and vomiting secondary to diabetic gastroparesis. -Patient has had gastric emptying study in July 2015 which was showing delayed gastric emptying and also  had EGD in April 2015 which was unremarkable. - Still having significant nausea and vomiting, on increased Reglan to 5 mg every 6 hours, a chest Zofran to scheduled dose, Continue  when necessary Phenergan  patients report erythromycin makes him nauseous, discussed with the patient, will try to resume it today. -  patient keep requesting IV pain medication, explained to the patient that opioids make his gastroparesis worse. - Requested gastroenterology consult to assist with patient symptoms .  Diabetes mellitus type 1 - Uncontrolled, last hemoglobin A1c 9.2 last week - CBG is acceptable on Lantus to 18 units. - Continue with insulin sliding scale  Hypertension - Continue with metoprolol, Norvasc, when necessary hydralazine   Code Status: Full  Family Communication: None at bedside  Disposition Plan: Home once stable   Procedures  None   Consults   None   Medications  Scheduled Meds: . amLODipine  10 mg Oral q morning - 10a  . enoxaparin (LOVENOX) injection  60 mg Subcutaneous Q24H  . erythromycin  250 mg Intravenous 4 times per day  . feeding supplement (GLUCERNA SHAKE)  237 mL Oral TID BM  . gabapentin  300 mg Oral QHS  . insulin aspart  0-9 Units Subcutaneous TID WC  . insulin glargine  18 Units Subcutaneous Daily  . metoCLOPramide (REGLAN) injection  5 mg Intravenous 4 times per day  . metoprolol  50 mg Oral BID  .  ondansetron (ZOFRAN) IV  4 mg Intravenous 4 times per day  . pantoprazole  40 mg Oral BID  . sucralfate  1 g Oral TID WC   Continuous Infusions:   PRN Meds:.acetaminophen **OR** acetaminophen, hydrALAZINE, LORazepam, oxyCODONE, promethazine  DVT Prophylaxis  Lovenox -  Lab Results  Component Value Date   PLT 275 05/17/2014    Antibiotics    Anti-infectives    Start     Dose/Rate Route Frequency Ordered Stop   05/17/14 1300  erythromycin 250 mg in sodium chloride 0.9 % 100 mL IVPB     250 mg 100 mL/hr over 60 Minutes Intravenous 4 times per  day 05/17/14 1135     05/15/14 1415  erythromycin 250 mg in sodium chloride 0.9 % 100 mL IVPB  Status:  Discontinued     250 mg 100 mL/hr over 60 Minutes Intravenous 4 times per day 05/15/14 1414 05/16/14 1627          Objective:   Filed Vitals:   05/16/14 1429 05/16/14 2208 05/17/14 0509 05/17/14 0948  BP: 138/78 166/76 137/75 141/82  Pulse: 99 109 88 92  Temp: 98.7 F (37.1 C) 99.3 F (37.4 C) 97.4 F (36.3 C)   TempSrc: Oral Oral Axillary   Resp: 18 18 18    Height:      Weight:      SpO2: 98% 99% 97%     Wt Readings from Last 3 Encounters:  05/11/14 125.193 kg (276 lb)  05/07/14 126.1 kg (278 lb)  05/05/14 121.7 kg (268 lb 4.8 oz)     Intake/Output Summary (Last 24 hours) at 05/17/14 1244 Last data filed at 05/16/14 1904  Gross per 24 hour  Intake      0 ml  Output    550 ml  Net   -550 ml     Physical Exam  Awake Alert, Oriented X 3, No new F.N deficits, Normal affect Duane [name],PERRAL Supple Neck,No JVD, No cervical lymphadenopathy appriciated.  Symmetrical Chest wall movement, Good air movement bilaterally, CTAB RRR,No Gallops,Rubs or new Murmurs, No Parasternal Heave +ve B.Sounds, Abd Soft, No tenderness, No organomegaly appriciated, No rebound - guarding or rigidity. No Cyanosis, Clubbing or edema, No new Rash or bruise     Data Review   Micro Results No results found for this or any previous visit (from the past 240 hour(s)).  Radiology Reports No results found.  CBC  Recent Labs Lab 05/10/14 2146 05/11/14 1253 05/12/14 0400 05/17/14 0445  WBC 9.2 13.0* 8.6 7.7  HGB 14.9 14.0 12.5* 13.3  HCT 43.5 41.0 37.9* 39.4  PLT 307 283 257 275  MCV 81.6 81.3 83.1 81.2  MCH 28.0 27.8 27.4 27.4  MCHC 34.3 34.1 33.0 33.8  RDW 12.8 13.0 12.9 12.2  LYMPHSABS 2.9 2.0  --   --   MONOABS 0.6 0.7  --   --   EOSABS 0.1 0.0  --   --   BASOSABS 0.0 0.0  --   --     Chemistries   Recent Labs Lab 05/10/14 2146 05/11/14 1253 05/12/14 0400  05/15/14 0625  NA 143 140 141 137  K 3.5 4.0 3.8 3.6  CL 105 103 105 101  CO2 26 26 28 29   GLUCOSE 164* 211* 143* 147*  BUN 9 9 10 11   CREATININE 1.16 1.01 1.05 1.18  CALCIUM 9.5 8.6 8.4 8.4  AST 32 25  --   --   ALT 23 22  --   --  ALKPHOS 92 73  --   --   BILITOT 0.5 0.8  --   --    ------------------------------------------------------------------------------------------------------------------ estimated creatinine clearance is 122.2 mL/min (by C-G formula based on Cr of 1.18). ------------------------------------------------------------------------------------------------------------------ No results for input(s): HGBA1C in the last 72 hours. ------------------------------------------------------------------------------------------------------------------ No results for input(s): CHOL, HDL, LDLCALC, TRIG, CHOLHDL, LDLDIRECT in the last 72 hours. ------------------------------------------------------------------------------------------------------------------ No results for input(s): TSH, T4TOTAL, T3FREE, THYROIDAB in the last 72 hours.  Invalid input(s): FREET3 ------------------------------------------------------------------------------------------------------------------ No results for input(s): VITAMINB12, FOLATE, FERRITIN, TIBC, IRON, RETICCTPCT in the last 72 hours.  Coagulation profile No results for input(s): INR, PROTIME in the last 168 hours.  No results for input(s): DDIMER in the last 72 hours.  Cardiac Enzymes No results for input(s): CKMB, TROPONINI, MYOGLOBIN in the last 168 hours.  Invalid input(s): CK ------------------------------------------------------------------------------------------------------------------ Invalid input(s): POCBNP     Time Spent in minutes   30 minutes  Roniqua Kintz M.D on 05/17/2014 at 12:44 PM  Between 7am to 7pm - Pager - [phone]  After 7pm go to [email or web address] - password [email or web address]  And look for the night coverage  person covering for me after hours  [facility] Office  [phone]   **Disclaimer: This note may have been dictated with voice recognition software. Similar sounding words can inadvertently be transcribed and this note may contain transcription errors which may not have been corrected upon publication of note.**

## 2014-05-17 NOTE — Plan of Care (Signed)
Problem: Phase III Progression Outcomes Goal: Activity at appropriate level-compared to baseline (UP IN CHAIR FOR HEMODIALYSIS)  Outcome: Not Progressing sleeping

## 2014-05-17 NOTE — Progress Notes (Signed)
Patient was very restless and agitated last night and kept asking for IV Dilaudid. Refused all oral medications and had several episodes of retching and vomited approx 200cc of clear liquid about 2300. Had IV Zofran with little effect. Obtained order for one time dose of IV Ativan 0.5 mg with very good effect as patient slept for the remainder of the shift. No complaints of nausea or vomiting voiced.

## 2014-05-17 NOTE — Progress Notes (Signed)
Patient will need gastric empty study tomorrow, will hold reglan, Ativan and Oxycodon, NPO after midnight. Phillips Climes MD

## 2014-05-17 NOTE — Progress Notes (Signed)
Pt has eaten lunch and dinner almost 95% and tolerated it well. He also has been walking around the unit, He has not vomited this shift.

## 2014-05-17 NOTE — Plan of Care (Signed)
Problem: Phase III Progression Outcomes Goal: Pain controlled on oral analgesia Outcome: Not Progressing Pt not taking oral pain med

## 2014-05-17 NOTE — Consult Note (Signed)
Referring Provider: No ref. provider found Primary Care Physician:  [name], MD Primary Gastroenterologist:  Althia Forts  Reason for Consultation:  Nausea and vomiting; gastroparesis  HPI: Duane [name] is a 39 y.o. male who has been a type I diabetic since age 50. He has a history of diabetic gastroparesis which was diagnosed in [place] around 2012 per the patient.  His initial inpatient GI evaluation here was by Dr. Henrene Pastor in April 2015. On CT scan of the abdomen and pelvis there was a suggestion of transverse colitis. He had an EGD and flexible sigmoidoscopy on 06/11/2013. These were benign and recommendations were for the medical management of gastroparesis. He had a gastric emptying study 08/18/2013 which confirmed known gastroparesis 100% retention at 1 and 2 hour intervals.  His hemoglobin A1c at that time was 9.2 and in August his hemoglobin A1c's were ranging from 9-9.3.  He was admitted to [facility] in early September 2015 with recurrent nausea and vomiting, and was evaluated by Dr. Penelope Coop of [facility]. He was subsequently transferred to [facility] for definitive care.   While at [facility], on 10/20/2013 he underwent an EGD which revealed linear erythema of the distal 5 cm of the esophagus with appearance typical of reflux esophagitis. His hemoglobin A1c on 10/19/2013 was 8.6. He was discharged from [facility] on 10/22/2013. The patient apparently discussed evaluation for gastric pacemaker with GI at [facility]. He was advised to call to make an appointment to be seen at their clinic in 4 weeks, however, he never followed up there because he says that he lost his insurance after losing his job.    He was then seen by our service again in 12/2013 at which time we had said that we exhausted our options here and that he had to return to [facility].  Once again he has not returned there because of no insurance.  On this occasion he has been in the  hospital for the past week with the same symptoms.  He says that on two occasions over this past week he started to have improvement but then got worse again.  He is on Zofran 4 mg every 6 hours, Phenergan prn, Reglan 5 mg IV every 6 hours, PPI BID, carafate suspension, and EES.  When he was seen today he had eaten and tolerated half of his lunch.  Was comfortably sitting in bed.   Past Medical History  Diagnosis Date  . Hypertension   . Diabetes mellitus without complication age 29    insulin requiring from the start  . Gastritis   . Gastroparesis 2012    100% gastric retention on 08/2013 GES  . Obesity   . Diabetic neuropathy   . GERD (gastroesophageal reflux disease)     Past Surgical History  Procedure Laterality Date  . Cholecystectomy  2013  . Knee surgery Left   . Flexible sigmoidoscopy N/A 06/11/2013    Procedure: FLEXIBLE SIGMOIDOSCOPY;  Surgeon: [name], MD;  Location: [facility] ENDOSCOPY;  Service: Endoscopy;  Laterality: N/A;  . Esophagogastroduodenoscopy N/A 06/11/2013    Procedure: ESOPHAGOGASTRODUODENOSCOPY (EGD);  Surgeon: [name], MD;  Location: [facility];  Service: Endoscopy;  Laterality: N/A;    Prior to Admission medications   Medication Sig Start Date End Date Taking? Authorizing Provider  amLODipine (NORVASC) 10 MG tablet Take 1 tablet (10 mg total) by mouth every morning. 05/03/14  Yes [name], MD  gabapentin (NEURONTIN) 300 MG capsule Take 1  capsule (300 mg total) by mouth at bedtime. 05/07/14  Yes Tiffany Daneil Dan, PA-C  glucose blood (ONE TOUCH ULTRA TEST) test strip Use as instructed to check blood sugar once a day dx code 250.00 05/07/14  Yes Tiffany Daneil Dan, PA-C  Insulin Pen Needle 31G X 8 MM MISC Check blood sugar TID and QHS 05/07/14  Yes Tiffany Daneil Dan, PA-C  Lancets [facility] ULTRASOFT) lancets Use as instructed 05/07/14  Yes Tiffany Daneil Dan, PA-C  metoCLOPramide (REGLAN) 10 MG tablet Take 1 tablet (10 mg total) by mouth 3 (three) times daily  before meals. For 1 week then as needed 05/07/14  Yes Tiffany S Noel, PA-C  metoprolol (LOPRESSOR) 50 MG tablet Take 1 tablet (50 mg total) by mouth 2 (two) times daily. 05/07/14  Yes Tiffany Daneil Dan, PA-C  pantoprazole (PROTONIX) 40 MG tablet Take 1 tablet (40 mg total) by mouth 2 (two) times daily. 05/07/14  Yes Tiffany Daneil Dan, PA-C  promethazine (PHENERGAN) 25 MG tablet Take 1 tablet (25 mg total) by mouth every 8 (eight) hours as needed for nausea or vomiting. 05/07/14  Yes Brayton Caves, PA-C  sucralfate (CARAFATE) 1 G tablet Take 1 tablet (1 g total) by mouth 3 (three) times daily with meals. 05/07/14  Yes Tiffany Daneil Dan, PA-C  insulin aspart (NOVOLOG) cartridge Inject 4 Units into the skin 3 (three) times daily with meals. 05/13/14   Silver Huguenin Elgergawy, MD  insulin glargine (LANTUS) 100 UNIT/ML injection Inject 0.18 mLs (18 Units total) into the skin at bedtime. 05/13/14   Albertine Patricia, MD    Current Facility-Administered Medications  Medication Dose Route Frequency Provider Last Rate Last Dose  . acetaminophen (TYLENOL) tablet 650 mg  650 mg Oral Q6H PRN [name], MD   650 mg at 05/11/14 1723   Or  . acetaminophen (TYLENOL) suppository 650 mg  650 mg Rectal Q6H PRN [name], MD      . amLODipine (NORVASC) tablet 10 mg  10 mg Oral q morning - 10a [name], MD   10 mg at 05/17/14 0951  . enoxaparin (LOVENOX) injection 60 mg  60 mg Subcutaneous Q24H Anh P Pham, RPH   60 mg at 05/17/14 1326  . erythromycin 250 mg in sodium chloride 0.9 % 100 mL IVPB  250 mg Intravenous 4 times per day Albertine Patricia, MD   250 mg at 05/17/14 1326  . feeding supplement (GLUCERNA SHAKE) (GLUCERNA SHAKE) liquid 237 mL  237 mL Oral TID BM Albertine Patricia, MD   237 mL at 05/17/14 1327  . gabapentin (NEURONTIN) capsule 300 mg  300 mg Oral QHS [name], MD   300 mg at 05/12/14 2131  . hydrALAZINE (APRESOLINE) injection 10 mg  10 mg Intravenous Q4H PRN [name], MD   10 mg at 05/14/14 0702  . insulin aspart (novoLOG) injection 0-9 Units  0-9 Units Subcutaneous TID WC [name], MD   1 Units at 05/16/14 1012  . insulin glargine (LANTUS) injection 18 Units  18 Units Subcutaneous Daily Albertine Patricia, MD   18 Units at 05/17/14 0957  . LORazepam (ATIVAN) 2 MG/ML concentrated solution 0.5 mg  0.5 mg Oral Q8H PRN Albertine Patricia, MD      . metoCLOPramide (REGLAN) injection 5 mg  5 mg Intravenous 4 times per day Albertine Patricia, MD   5 mg at 05/17/14 1325  . metoprolol (LOPRESSOR) tablet 50 mg  50 mg Oral BID [name], MD   50 mg at 05/17/14 0948  . ondansetron (ZOFRAN) injection 4 mg  4 mg Intravenous 4 times per day Albertine Patricia, MD   4 mg at 05/17/14 1327  . oxyCODONE (Oxy IR/ROXICODONE) immediate release tablet 5 mg  5 mg Oral Q4H PRN Albertine Patricia, MD   5 mg at 05/15/14 2101  . pantoprazole (PROTONIX) EC tablet 40 mg  40 mg Oral BID [name], MD   40 mg at 05/17/14 0951  . promethazine (PHENERGAN) tablet 25 mg  25 mg Oral Q6H PRN Silver Huguenin Elgergawy, MD      . sucralfate (CARAFATE) 1 GM/10ML suspension 1 g  1 g Oral TID WC Albertine Patricia, MD   1 g at 05/17/14 1227    Allergies as of 05/10/2014  . (No Known Allergies)    Family History  Problem Relation Age of Onset  . Diabetes Father     History   Social History  . Marital Status: Single    Spouse Name: N/A  . Number of Children: N/A  . Years of Education: N/A   Occupational History  . 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    Social History Main Topics  . Smoking status: Never Smoker   . Smokeless tobacco: Never Used  . Alcohol Use: Yes     Comment: "none really" - 08/27/13  . Drug Use: No  . Sexual Activity: Yes   Other Topics Concern  . Not on file   Social History Narrative    Review of Systems: Ten point ROS is O/W negative except as mentioned in HPI.  Physical Exam: Vital signs  in last 24 hours: Temp:  [97.4 F (36.3 C)-99.3 F (37.4 C)] 98.2 F (36.8 C) (04/04 1335) Pulse Rate:  [83-109] 83 (04/04 1335) Resp:  [16-18] 16 (04/04 1335) BP: (129-166)/(75-84) 129/84 mmHg (04/04 1335) SpO2:  [97 %-99 %] 98 % (04/04 1335) Last BM Date: 05/15/14 General:  Alert, Well-developed, well-nourished, pleasant, and cooperative in NAD Head:  Normocephalic and atraumatic. Eyes:  Sclera clear, no icterus.  Conjunctiva pink. Ears:  Normal auditory acuity. Mouth:  No deformity or lesions.   Lungs:  Clear throughout to auscultation.  No wheezes, crackles, or rhonchi.  Heart:  Regular rate and rhythm; no murmurs. Abdomen:  Soft, non-distended.  BS present.  Mild epigastric/mid-abdominal TTP, but abdominal exam is benign. Rectal:  Deferred  Msk:  Symmetrical without gross deformities. Pulses:  Normal pulses noted. Extremities:  Without clubbing or edema. Neurologic:  Alert and  oriented x4;  grossly normal neurologically. Skin:  Intact without significant lesions or rashes. Psych:  Alert and cooperative. Normal mood and affect.  Intake/Output from previous day: 04/03 0701 - 04/04 0700 In: 773.7 [P.O.:557; I.V.:216.7] Out: 1150 [Urine:600; Emesis/NG output:550] Intake/Output this shift: Total I/O In: 1262 [P.O.:1162; IV Piggyback:100] Out: -   Lab Results:  Recent Labs  05/17/14 0445  WBC 7.7  HGB 13.3  HCT 39.4  PLT 275   BMET  Recent Labs  05/15/14 0625  NA 137  K 3.6  CL 101  CO2 29  GLUCOSE 147*  BUN 11  CREATININE 1.18  CALCIUM 8.4   IMPRESSION/PLAN:  *39 year old male with frequent bouts of nausea, vomiting, and abdominal pain due to gastroparesis.  Admits to poor compliance at home recently due to no insurance and inability to financially get his medication. EGD  in April 2015 showed hiatal hernia, and most recent EGD in September 2015 at [facility] revealed reflux esophagitis.  Obviously optimization of glycemic control is essential both short  and long-term. Would continue to avoid or at least minimize opioid pain medications and anticholinergics.  Ultimately he probably needs to return to see Dr. Derrill Kay at [facility] once he is able to get some insurance again. Would continue Zofran 4 mg every 6 hours, Phenergan prn, Reglan, PPI BID, carafate suspension, and EES.  Unsure what else we have to offer here at this time.   ZEHR, JESSICA D.  05/17/2014, 2:07 PM  Pager number [phone]  Attending MD note:   I have taken a history, examined the patient, and reviewed the chart.I know the patient from prior admission fall 2015. I agree with the Advanced Practitioner's impression and recommendations. Despite of "intractable vomiting", pt has not lost any weight. Also his serum albumin is normal at 4.0 indicating normal visceral protein. He must be getting some food.Last GES 08/2013, would repeat.  Melburn Popper [facility] Pager # [phone]

## 2014-05-18 ENCOUNTER — Inpatient Hospital Stay (HOSPITAL_COMMUNITY): Payer: Medicaid Other

## 2014-05-18 DIAGNOSIS — R1115 Cyclical vomiting syndrome unrelated to migraine: Secondary | ICD-10-CM | POA: Insufficient documentation

## 2014-05-18 LAB — BASIC METABOLIC PANEL
Anion gap: 7 (ref 5–15)
BUN: 14 mg/dL (ref 6–23)
CO2: 27 mmol/L (ref 19–32)
Calcium: 8.4 mg/dL (ref 8.4–10.5)
Chloride: 104 mmol/L (ref 96–112)
Creatinine, Ser: 1.12 mg/dL (ref 0.50–1.35)
GFR, EST NON AFRICAN AMERICAN: 82 mL/min — AB (ref >90)
Glucose, Bld: 154 mg/dL — ABNORMAL HIGH (ref 70–99)
POTASSIUM: 3.1 mmol/L — AB (ref 3.5–5.1)
Sodium: 138 mmol/L (ref 135–145)

## 2014-05-18 LAB — GLUCOSE, CAPILLARY
GLUCOSE-CAPILLARY: 126 mg/dL — AB (ref 70–99)
GLUCOSE-CAPILLARY: 154 mg/dL — AB (ref 70–99)
GLUCOSE-CAPILLARY: 132 mg/dL — AB (ref 70–99)
Glucose-Capillary: 136 mg/dL — ABNORMAL HIGH (ref 70–99)
Glucose-Capillary: 181 mg/dL — ABNORMAL HIGH (ref 70–99)

## 2014-05-18 MED ORDER — PROMETHAZINE HCL 25 MG/ML IJ SOLN
25.0000 mg | Freq: Four times a day (QID) | INTRAMUSCULAR | Status: DC | PRN
  Administered 2014-05-18 – 2014-05-23 (×11): 25 mg via INTRAVENOUS
  Filled 2014-05-18 (×11): qty 1

## 2014-05-18 MED ORDER — ONDANSETRON 8 MG/NS 50 ML IVPB
8.0000 mg | Freq: Four times a day (QID) | INTRAVENOUS | Status: DC
  Administered 2014-05-18 – 2014-05-24 (×22): 8 mg via INTRAVENOUS
  Filled 2014-05-18 (×28): qty 8

## 2014-05-18 MED ORDER — LORAZEPAM 2 MG/ML PO CONC
1.0000 mg | Freq: Three times a day (TID) | ORAL | Status: DC | PRN
  Administered 2014-05-18 – 2014-05-20 (×3): 1 mg via ORAL
  Filled 2014-05-18 (×3): qty 1

## 2014-05-18 MED ORDER — POTASSIUM CHLORIDE CRYS ER 20 MEQ PO TBCR: 40.0000 meq | EXTENDED_RELEASE_TABLET | Freq: Once | ORAL | Status: DC

## 2014-05-18 MED ORDER — POTASSIUM CHLORIDE 20 MEQ/15ML (10%) PO SOLN
40.0000 meq | Freq: Once | ORAL | Status: DC
Start: 2014-05-18 — End: 2014-05-24
  Filled 2014-05-18: qty 30

## 2014-05-18 MED ORDER — OXYCODONE HCL 5 MG PO TABS
5.0000 mg | ORAL_TABLET | ORAL | Status: DC | PRN
Start: 2014-05-18 — End: 2014-05-24
  Administered 2014-05-18 – 2014-05-23 (×7): 5 mg via ORAL
  Filled 2014-05-18 (×10): qty 1

## 2014-05-18 MED ORDER — POTASSIUM CHLORIDE 10 MEQ/100ML IV SOLN
10.0000 meq | INTRAVENOUS | Status: AC
  Administered 2014-05-18 – 2014-05-19 (×4): 10 meq via INTRAVENOUS
  Filled 2014-05-18 (×4): qty 100

## 2014-05-18 MED ORDER — METOCLOPRAMIDE HCL 5 MG/ML IJ SOLN
5.0000 mg | Freq: Four times a day (QID) | INTRAMUSCULAR | Status: DC
  Administered 2014-05-18 – 2014-05-21 (×13): 5 mg via INTRAVENOUS
  Filled 2014-05-18 (×7): qty 1
  Filled 2014-05-18: qty 2
  Filled 2014-05-18: qty 1
  Filled 2014-05-18 (×2): qty 2
  Filled 2014-05-18: qty 1
  Filled 2014-05-18 (×3): qty 2
  Filled 2014-05-18 (×3): qty 1

## 2014-05-18 MED ORDER — MORPHINE SULFATE 2 MG/ML IJ SOLN
2.0000 mg | Freq: Once | INTRAMUSCULAR | Status: AC
  Administered 2014-05-18: 2 mg via INTRAVENOUS
  Filled 2014-05-18: qty 1

## 2014-05-18 MED ORDER — POTASSIUM CHLORIDE IN NACL 40-0.9 MEQ/L-% IV SOLN
INTRAVENOUS | Status: AC
  Administered 2014-05-18: 50 mL/h via INTRAVENOUS
  Filled 2014-05-18: qty 1000

## 2014-05-18 NOTE — Progress Notes (Signed)
Pt was NPO after midnight ativan, oxir and reglan was held per MD orders but zofran was given this morning as ordered. Per Nuclear medicine Angela Nevin) pt cannot have any Gi meds before this test so test can not be performed today. Pt is notified.

## 2014-05-18 NOTE — Progress Notes (Signed)
   Subjective  vomitting into a basin- clear saliva, no bile,appears miserable   Objective   Ongoing problem with vomiting. GES nor completed due to patient vomiting the toast and the egg. Asking for stronger pain meds.Just received Oxycodone- appears drowsy.  Vital signs in last 24 hours: Temp:  [98 F (36.7 C)] 98 F (36.7 C) (04/05 0416) Pulse Rate:  [72] 72 (04/05 0416) Resp:  [16] 16 (04/05 0416) BP: (127)/(78) 127/78 mmHg (04/05 0416) SpO2:  [100 %] 100 % (04/05 0416) Last BM Date: 05/17/14 General:    AA male in a distress Heart:  Regular rate rapid rate 100/min and rhythm; no murmurs Lungs: Respirations even and unlabored, lungs CTA bilaterally Abdomen:  Soft, nontender and nondistended. quiet bowel sounds.,tender epigastrium Extremities:  Without edema. Neurologic:  drowsy  grossly normal neurologically. Psych:  Cooperative. ,unable to converse because he is gagging and retching  Intake/Output from previous day: 04/04 0701 - 04/05 0700 In: 1742 [P.O.:1642; IV Piggyback:100] Out: -  Intake/Output this shift: Total I/O In: -  Out: 300 [Emesis/NG output:300]  Lab Results:  Recent Labs  05/17/14 0445  WBC 7.7  HGB 13.3  HCT 39.4  PLT 275   BMET  Recent Labs  05/18/14 0405  NA 138  K 3.1*  CL 104  CO2 27  GLUCOSE 154*  BUN 14  CREATININE 1.12  CALCIUM 8.4   LFT No results for input(s): PROT, ALBUMIN, AST, ALT, ALKPHOS, BILITOT, BILIDIR, IBILI in the last 72 hours. PT/INR No results for input(s): LABPROT, INR in the last 72 hours.  Studies/Results: No results found.     Assessment / Plan:   On-going  inability to retain food, not sure if all due to gastroparesis or an anxiety/depression state. Electrolytes so far OK, will recheck in am. Again, we consider placement of NG tube to low intermittent suction ( had it placed last fall during [facility] hospitalization), ,increase Zofran to 8 mg IV, continue to monitor electrolytes. Monitor for  DKA.  Principal Problem:   Intractable vomiting with nausea Active Problems:   HTN (hypertension)   Diabetic gastroparesis   Essential hypertension   Nausea & vomiting     LOS: 7 days   Delfin Edis  05/18/2014, 9:06 PM

## 2014-05-18 NOTE — Progress Notes (Addendum)
Patient Demographics  Duane [name], is a 39 y.o. male, DOB - Sep 19, 1975, [id number]  Admit date - 05/10/2014   Admitting Physician Costin Karlyne Greenspan, MD  Outpatient Primary MD for the patient is NNODI, Doreene Burke, MD  LOS - 7   Chief Complaint  Patient presents with  . Vomiting       Admission history of present illness/brief narrative: Duane [name] is a 39 y.o. male with history of diabetes mellitus type 1 who has had recent multiple admissions for gastroparesis related nausea vomiting presents to the ER because of worsening nausea vomiting since last evening. Patient states he has been unable to keep anything because of the vomiting. Patient also has been having some epigastric discomfort. Denies any blood in the vomitus denies any diarrhea. Denies any fever chills chest pain or shortness of breath. In the ER patient had no  episodes of vomiting and has been admitted for further management of nausea vomiting most likely from gastroparesis. Patient has had gastric emptying study in July 2015 which was showing delayed gastric emptying. EGD done in April 2015 was unremarkable. Patient states that he had no refills on his Reglan and has not taken it for a couple of days.  [facility] course was complicated by recurrent nausea and vomiting, as it appears to be improving, but unfortunately prior to discharge his nausea and vomiting reoccurs.  Subjective:   [name] today has, No headache, No chest pain, No abdominal pain , No new weakness tingling or numbness, No Cough - SOB. Patient was unable to tolerate gastric empty study this morning.  Assessment & Plan    Principal Problem:   Intractable vomiting with nausea Active Problems:   HTN (hypertension)   Diabetic gastroparesis   Essential hypertension   Nausea & vomiting  Intractable nausea and vomiting secondary  to diabetic gastroparesis. -Patient has had gastric emptying study in July 2015 which was showing delayed gastric emptying and also had EGD in April 2015 which was unremarkable. - Still having significant nausea and vomiting, on  Reglan to 5 mg every 6 hours, on scheduled Zofran, Continue  when necessary Phenergan  , continue with erythromycin.  -  patient keep requesting IV narcotics, and IV Ativan, had multiple discussions with the patient regarding that, and explained that our plan is to try to minimize opioids medication given its worsening his gastroparesis. - Gastrology consult appreciated, albumin is 4, no signs of clinical dehydration. - Unable to tolerate gastric to study today secondary to vomiting.  Diabetes mellitus type 1 - Uncontrolled, last hemoglobin A1c 9.2  - CBG is acceptable on Lantus to 18 units. - Continue with insulin sliding scale  Hypertension - Continue with metoprolol, Norvasc, when necessary hydralazine  Hypokalemia - Will replace, recheck in a.m..   Code Status: Full  Family Communication: None at bedside  Disposition Plan: Home once stable   Procedures  None   Consults   None   Medications  Scheduled Meds: . amLODipine  10 mg Oral q morning - 10a  . enoxaparin (LOVENOX) injection  60 mg Subcutaneous Q24H  . erythromycin  250 mg Intravenous 4 times per day  . feeding supplement (GLUCERNA SHAKE)  237 mL Oral TID BM  . gabapentin  300  mg Oral QHS  . insulin aspart  0-9 Units Subcutaneous TID WC  . insulin glargine  18 Units Subcutaneous Daily  . metoCLOPramide (REGLAN) injection  5 mg Intravenous 4 times per day  . metoprolol  50 mg Oral BID  . ondansetron (ZOFRAN) IV  4 mg Intravenous 4 times per day  . pantoprazole  40 mg Oral BID  . potassium chloride  40 mEq Oral Once  . sucralfate  1 g Oral TID WC   Continuous Infusions:   PRN Meds:.acetaminophen **OR** acetaminophen, hydrALAZINE, LORazepam, oxyCODONE, promethazine  DVT Prophylaxis   Lovenox -  Lab Results  Component Value Date   PLT 275 05/17/2014    Antibiotics    Anti-infectives    Start     Dose/Rate Route Frequency Ordered Stop   05/17/14 1300  erythromycin 250 mg in sodium chloride 0.9 % 100 mL IVPB     250 mg 100 mL/hr over 60 Minutes Intravenous 4 times per day 05/17/14 1135     05/15/14 1415  erythromycin 250 mg in sodium chloride 0.9 % 100 mL IVPB  Status:  Discontinued     250 mg 100 mL/hr over 60 Minutes Intravenous 4 times per day 05/15/14 1414 05/16/14 1627          Objective:   Filed Vitals:   05/17/14 0948 05/17/14 1335 05/17/14 2020 05/18/14 0416  BP: 141/82 129/84 141/79 127/78  Pulse: 92 83 80 72  Temp:  98.2 F (36.8 C) 97.4 F (36.3 C) 98 F (36.7 C)  TempSrc:  Oral Oral Oral  Resp:  16 16 16   Height:      Weight:      SpO2:  98% 98% 100%    Wt Readings from Last 3 Encounters:  05/11/14 125.193 kg (276 lb)  05/07/14 126.1 kg (278 lb)  05/05/14 121.7 kg (268 lb 4.8 oz)     Intake/Output Summary (Last 24 hours) at 05/18/14 1457 Last data filed at 05/18/14 1442  Gross per 24 hour  Intake    580 ml  Output      0 ml  Net    580 ml     Physical Exam  Awake Alert, Oriented X 3, No new F.N deficits, Normal affect [place].AT,PERRAL Supple Neck,No JVD, No cervical lymphadenopathy appriciated.  Symmetrical Chest wall movement, Good air movement bilaterally, CTAB RRR,No Gallops,Rubs or new Murmurs, No Parasternal Heave +ve B.Sounds, Abd Soft, No tenderness, No organomegaly appriciated, No rebound - guarding or rigidity. No Cyanosis, Clubbing or edema, No new Rash or bruise     Data Review   Micro Results No results found for this or any previous visit (from the past 240 hour(s)).  Radiology Reports No results found.  CBC  Recent Labs Lab 05/12/14 0400 05/17/14 0445  WBC 8.6 7.7  HGB 12.5* 13.3  HCT 37.9* 39.4  PLT 257 275  MCV 83.1 81.2  MCH 27.4 27.4  MCHC 33.0 33.8  RDW 12.9 12.2    Chemistries    Recent Labs Lab 05/12/14 0400 05/15/14 0625 05/18/14 0405  NA 141 137 138  K 3.8 3.6 3.1*  CL 105 101 104  CO2 28 29 27   GLUCOSE 143* 147* 154*  BUN 10 11 14   CREATININE 1.05 1.18 1.12  CALCIUM 8.4 8.4 8.4   ------------------------------------------------------------------------------------------------------------------ estimated creatinine clearance is 128.8 mL/min (by C-G formula based on Cr of 1.12). ------------------------------------------------------------------------------------------------------------------ No results for input(s): HGBA1C in the last 72 hours. ------------------------------------------------------------------------------------------------------------------ No results for input(s): CHOL, HDL,  LDLCALC, TRIG, CHOLHDL, LDLDIRECT in the last 72 hours. ------------------------------------------------------------------------------------------------------------------ No results for input(s): TSH, T4TOTAL, T3FREE, THYROIDAB in the last 72 hours.  Invalid input(s): FREET3 ------------------------------------------------------------------------------------------------------------------ No results for input(s): VITAMINB12, FOLATE, FERRITIN, TIBC, IRON, RETICCTPCT in the last 72 hours.  Coagulation profile No results for input(s): INR, PROTIME in the last 168 hours.  No results for input(s): DDIMER in the last 72 hours.  Cardiac Enzymes No results for input(s): CKMB, TROPONINI, MYOGLOBIN in the last 168 hours.  Invalid input(s): CK ------------------------------------------------------------------------------------------------------------------ Invalid input(s): POCBNP     Time Spent in minutes   30 minutes  Deandrae Wajda M.D on 05/18/2014 at 2:57 PM  Between 7am to 7pm - Pager - [phone]  After 7pm go to [email or web address] - password [email or web address]  And look for the night coverage person covering for me after hours  [facility] Office   [phone]   **Disclaimer: This note may have been dictated with voice recognition software. Similar sounding words can inadvertently be transcribed and this note may contain transcription errors which may not have been corrected upon publication of note.**

## 2014-05-19 DIAGNOSIS — E1069 Type 1 diabetes mellitus with other specified complication: Secondary | ICD-10-CM

## 2014-05-19 DIAGNOSIS — F322 Major depressive disorder, single episode, severe without psychotic features: Secondary | ICD-10-CM | POA: Diagnosis present

## 2014-05-19 LAB — COMPREHENSIVE METABOLIC PANEL
ALBUMIN: 3.6 g/dL (ref 3.5–5.2)
ALK PHOS: 63 U/L (ref 39–117)
ALT: 14 U/L (ref 0–53)
AST: 11 U/L (ref 0–37)
Anion gap: 11 (ref 5–15)
BILIRUBIN TOTAL: 0.8 mg/dL (ref 0.3–1.2)
BUN: 8 mg/dL (ref 6–23)
CHLORIDE: 103 mmol/L (ref 96–112)
CO2: 25 mmol/L (ref 19–32)
Calcium: 8.6 mg/dL (ref 8.4–10.5)
Creatinine, Ser: 1.07 mg/dL (ref 0.50–1.35)
GFR calc Af Amer: >90 mL/min (ref >90)
GFR calc non Af Amer: 87 mL/min — ABNORMAL LOW (ref >90)
Glucose, Bld: 153 mg/dL — ABNORMAL HIGH (ref 70–99)
Potassium: 3.7 mmol/L (ref 3.5–5.1)
Sodium: 139 mmol/L (ref 135–145)
Total Protein: 6.8 g/dL (ref 6.0–8.3)

## 2014-05-19 LAB — GLUCOSE, CAPILLARY
GLUCOSE-CAPILLARY: 158 mg/dL — AB (ref 70–99)
GLUCOSE-CAPILLARY: 255 mg/dL — AB (ref 70–99)
Glucose-Capillary: 128 mg/dL — ABNORMAL HIGH (ref 70–99)
Glucose-Capillary: 98 mg/dL (ref 70–99)

## 2014-05-19 MED ORDER — SODIUM CHLORIDE 0.9 % IJ SOLN
10.0000 mL | INTRAMUSCULAR | Status: DC | PRN
  Administered 2014-05-21: 10 mL
  Filled 2014-05-19: qty 40

## 2014-05-19 MED ORDER — FLUOXETINE HCL 10 MG PO CAPS
10.0000 mg | ORAL_CAPSULE | Freq: Every day | ORAL | Status: DC
  Administered 2014-05-19 – 2014-05-24 (×6): 10 mg via ORAL
  Filled 2014-05-19 (×6): qty 1

## 2014-05-19 MED ORDER — TRAZODONE HCL 50 MG PO TABS
50.0000 mg | ORAL_TABLET | Freq: Every day | ORAL | Status: DC
Start: 2014-05-19 — End: 2014-05-24
  Administered 2014-05-19 – 2014-05-23 (×4): 50 mg via ORAL
  Filled 2014-05-19 (×6): qty 1

## 2014-05-19 MED ORDER — SODIUM CHLORIDE 0.9 % IJ SOLN
10.0000 mL | Freq: Two times a day (BID) | INTRAMUSCULAR | Status: DC
  Administered 2014-05-19 – 2014-05-24 (×10): 10 mL

## 2014-05-19 NOTE — Progress Notes (Signed)
Clinical Social Work Department CLINICAL SOCIAL WORK PSYCHIATRY SERVICE LINE ASSESSMENT 05/19/2014  Patient:  Duane [name]  Account:  [id number]  Admit Date:  05/10/2014  Clinical Social Worker:  Peri Maris, CLINICAL SOCIAL WORKER  Date/Time:  05/19/2014 03:00 PM Referred by:  Physician  Date referred:  05/19/2014 Reason for Referral  Behavioral Health Issues   Presenting Symptoms/Problems (In the person's/family's own words):   Pt was referred by attending physician for depression   Abuse/Neglect/Trauma History (check all that apply)  Denies history   Abuse/Neglect/Trauma Comments:   N/A   Psychiatric History (check all that apply)  Denies history   Psychiatric medications:  Prozac 65m  Neurontin 3057m Trazadone 5056m Current Mental Health Hospitalizations/Previous Mental Health History:   Pt reports feelings of sadness and depression due to loss of job, health issues, and loss of relationships.   Current provider:   None   Place and Date:   N/A   Current Medications:   Scheduled Meds:      . amLODipine  10 mg Oral q morning - 10a  . enoxaparin (LOVENOX) injection  60 mg Subcutaneous Q24H  . erythromycin  250 mg Intravenous 4 times per day  . feeding supplement (GLUCERNA SHAKE)  237 mL Oral TID BM  . FLUoxetine  10 mg Oral Daily  . gabapentin  300 mg Oral QHS  . insulin aspart  0-9 Units Subcutaneous TID WC  . insulin glargine  18 Units Subcutaneous Daily  . metoCLOPramide (REGLAN) injection  5 mg Intravenous 4 times per day  . metoprolol  50 mg Oral BID  . ondansetron (ZOFRAN) IV  8 mg Intravenous 4 times per day  . pantoprazole  40 mg Oral BID  . potassium chloride  40 mEq Oral Once  . sodium chloride  10-40 mL Intracatheter Q12H  . sucralfate  1 g Oral TID WC  . traZODone  50 mg Oral QHS        Continuous Infusions:      PRN Meds:.acetaminophen **OR** acetaminophen, hydrALAZINE, LORazepam, oxyCODONE, promethazine, sodium chloride       Previous  Impatient Admission/Date/Reason:   Pt denies   Emotional Health / Current Symptoms    Suicide/Self Harm  Suicidal ideation (ex: "I can't take any more,I wish I could disappear")   Suicide attempt in the past:   Reports having SI in the past but not currently   Other harmful behavior:   N/A   Psychotic/Dissociative Symptoms  None reported   Other Psychotic/Dissociative Symptoms:    Attention/Behavioral Symptoms  Within Normal Limits   Other Attention / Behavioral Symptoms:    Cognitive Impairment  Within Normal Limits   Other Cognitive Impairment:    Mood and Adjustment  Flat  DEPRESSION    Stress, Anxiety, Trauma, Any Recent Loss/Stressor  Relationship   Anxiety (frequency):   Pt denies   Phobia (specify):   Pt denies   Compulsive behavior (specify):   Pt denies   Obsessive behavior (specify):   Pt denies   Other:   Pt reports a loss of his job in January of this year. He reports being overwhelmed by recurrent medical issues and inability to provide. Pt's children [place] he attempts to maintain a relationship with them.   Substance Abuse/Use  None   SBIRT completed (please refer for detailed history):  NA  Self-reported substance use:   Pt denies   Urinary Drug Screen Completed:  Y Alcohol level:   None detected  Environmental/Housing/Living Arrangement  With Biological Parent(s)   Who is in the home:   Pt's mother and father   Emergency contact:  Delrae Rend, mother   Financial  IPRS   Patient's Strengths and Goals (patient's own words):   Pt has history of stable income and productivity. He also has insight into depressive symptoms and is motivated for treatment.   Clinical Social Worker's Interpretive Summary:   CSW met with Pt along with psych MD. Pt was calm and cooperative throughout assessment. He presented with a flat affect and reported a depressed mood in the context of job loss, little social support, and recurrent  medical conditions. Pt reports trouble sleeping, feelings of purposelessness, fatigue, lack of motivation, and sadness. Pt denies any previous psychiatric treatment. He also denies AVH, SI, HI. He expressed interest in outpatient therapy and was agreeable to starting medication to help with depression and difficulty sleeping. CSW will make outpatient referrals as necessary and continue following Pt while hospitalized.   Disposition:  Recommend Psych CSW continuing to support while in hospital   Peri Maris, [place] 05/19/2014 4:10 PM [phone]

## 2014-05-19 NOTE — Progress Notes (Addendum)
Patient Demographics  Duane [name], is a 39 y.o. male, DOB - 1975/07/12, [id number]  Outpatient Primary MD for the patient is NNODI, ADAKU, MD  LOS - 8 Days   Chief Complaint  Patient presents with  . Vomiting       Brief narrative: Duane [name] is a 39 y.o. male with history of diabetes mellitus type 1 who has had recent multiple admissions for gastroparesis related nausea vomiting. He presented with recurrent complaints of nausea and vomiting. He was admitted for further management. Patient has had gastric emptying study in July 2015 which was showing delayed gastric emptying. EGD done in April 2015 was unremarkable. Hospital course was complicated by recurrent nausea and vomiting, as it appears to be improving, but unfortunately prior to discharge his nausea and vomiting reoccurs.  Subjective:   Patient did vomit this morning. He has vomited a few times over the last 24 hours, but unable to quantify.  Assessment & Plan    Principal Problem:   Intractable vomiting with nausea Active Problems:   HTN (hypertension)   Diabetic gastroparesis   Essential hypertension   Nausea & vomiting   Intractable cyclical vomiting with nausea    Intractable nausea and vomiting secondary to diabetic gastroparesis. -Patient has had gastric emptying study in July 2015 which was showing delayed gastric emptying and also had EGD in April 2015 which was unremarkable. - Continues to have vomiting. He is on Reglan, Zofran, Phenergan, erythromycin. Gastroenterology is following. - Patient kept requesting IV narcotics, and IV Ativan. Had multiple discussions with the patient regarding that, and explained that our plan is to try to minimize opioids medication given its potential to worsen his gastroparesis.  Diabetes mellitus type 1 - hemoglobin A1c 9.2  - CBG is acceptable on  Lantus 18 units. - Continue with insulin sliding scale  Essential Hypertension - Continue with metoprolol, Norvasc, when necessary hydralazine  Hypokalemia - Repleted  DVT prophylaxis: Lovenox Code Status: Full Family Communication: Discussed with patient Disposition Plan: Home once stable   Procedures  None  Consults   Gastroenterology   Medications  Scheduled Meds: . amLODipine  10 mg Oral q morning - 10a  . enoxaparin (LOVENOX) injection  60 mg Subcutaneous Q24H  . erythromycin  250 mg Intravenous 4 times per day  . feeding supplement (GLUCERNA SHAKE)  237 mL Oral TID BM  . gabapentin  300 mg Oral QHS  . insulin aspart  0-9 Units Subcutaneous TID WC  . insulin glargine  18 Units Subcutaneous Daily  . metoCLOPramide (REGLAN) injection  5 mg Intravenous 4 times per day  . metoprolol  50 mg Oral BID  . ondansetron (ZOFRAN) IV  8 mg Intravenous 4 times per day  . pantoprazole  40 mg Oral BID  . potassium chloride  40 mEq Oral Once  . sodium chloride  10-40 mL Intracatheter Q12H  . sucralfate  1 g Oral TID WC   Continuous Infusions: . 0.9 % NaCl with KCl 40 mEq / L 50 mL/hr (05/18/14 2341)   PRN Meds:.acetaminophen **OR** acetaminophen, hydrALAZINE, LORazepam, oxyCODONE, promethazine, sodium chloride  Antibiotics    Anti-infectives    Start     Dose/Rate Route Frequency Ordered Stop   05/17/14 1300  erythromycin 250  mg in sodium chloride 0.9 % 100 mL IVPB     250 mg 100 mL/hr over 60 Minutes Intravenous 4 times per day 05/17/14 1135     05/15/14 1415  erythromycin 250 mg in sodium chloride 0.9 % 100 mL IVPB  Status:  Discontinued     250 mg 100 mL/hr over 60 Minutes Intravenous 4 times per day 05/15/14 1414 05/16/14 1627        Objective:   Filed Vitals:   05/18/14 0416 05/18/14 2126 05/19/14 0043 05/19/14 0604  BP: 127/78 179/90 141/71 130/86  Pulse: 72 119 99 104  Temp: 98 F (36.7 C) 99.1 F (37.3 C)  98.5 F (36.9 C)  TempSrc: Oral Oral  Oral    Resp: 16 16  16   Height:      Weight:      SpO2: 100% 100%  99%    Wt Readings from Last 3 Encounters:  05/11/14 125.193 kg (276 lb)  05/07/14 126.1 kg (278 lb)  05/05/14 121.7 kg (268 lb 4.8 oz)     Intake/Output Summary (Last 24 hours) at 05/19/14 0913 Last data filed at 05/19/14 [id number]  Gross per 24 hour  Intake   1348 ml  Output   1525 ml  Net   -177 ml     Physical Exam  Awake Alert, in no distress Symmetrical Chest wall movement, Good air movement bilaterally, CTAB S1, S2 normal, regular.4. No rubs, murmurs, or bruit. No pedal edema.  Abdomen is soft. Nontender. Nondistended. Bowel sounds are present. No masses or organomegaly.   Data Review  CBC  Recent Labs Lab 05/17/14 0445  WBC 7.7  HGB 13.3  HCT 39.4  PLT 275  MCV 81.2  MCH 27.4  MCHC 33.8  RDW 12.2    Chemistries   Recent Labs Lab 05/15/14 0625 05/18/14 0405 05/19/14 0403  NA 137 138 139  K 3.6 3.1* 3.7  CL 101 104 103  CO2 29 27 25   GLUCOSE 147* 154* 153*  BUN 11 14 8   CREATININE 1.18 1.12 1.07  CALCIUM 8.4 8.4 8.6  AST  --   --  11  ALT  --   --  14  ALKPHOS  --   --  63  BILITOT  --   --  0.8     Karlin Heilman M.D on 05/19/2014 at 9:13 AM  Between 7am to 7pm - Pager - [phone]  After 7pm go to [email or web address] - password [email or web address]  And look for the night coverage person covering for me after hours  [facility]  Office  [phone]

## 2014-05-19 NOTE — Progress Notes (Signed)
Peripherally Inserted Central Catheter/Midline Placement  The IV Nurse has discussed with the patient and/or persons authorized to consent for the patient, the purpose of this procedure and the potential benefits and risks involved with this procedure.  The benefits include less needle sticks, lab draws from the catheter and patient may be discharged home with the catheter.  Risks include, but not limited to, infection, bleeding, blood clot (thrombus formation), and puncture of an artery; nerve damage and irregular heat beat.  Alternatives to this procedure were also discussed.  PICC/Midline Placement Documentation  PICC / Midline Double Lumen 05/19/14 PICC Right Brachial 48 cm 4 cm (Active)  Indication for Insertion or Continuance of Line Poor Vasculature-patient has had multiple peripheral attempts or PIVs lasting less than 24 hours 05/19/2014  8:00 AM  Exposed Catheter (cm) 4 cm 05/19/2014  8:00 AM  Dressing Change Due 05/26/14 05/19/2014  8:00 AM       Jule Economy Horton 05/19/2014, 8:42 AM

## 2014-05-19 NOTE — Plan of Care (Signed)
Problem: Phase I Progression Outcomes Goal: Hemodynamically stable Outcome: Progressing Pt unable to tolerate oral metoprolol at this time; BP controlled with pain and nausea interventions.

## 2014-05-19 NOTE — Progress Notes (Signed)
    Progress Note   Subjective  vomited again this am.   Objective   Vital signs in last 24 hours: Temp:  [98.5 F (36.9 C)-99.1 F (37.3 C)] 98.5 F (36.9 C) (04/06 0604) Pulse Rate:  [87-119] 87 (04/06 0951) Resp:  [16] 16 (04/06 0604) BP: (130-179)/(71-94) 151/94 mmHg (04/06 0951) SpO2:  [99 %-100 %] 99 % (04/06 0604) Last BM Date: 05/17/14 General:    Healthy black male in NAD Heart:  Regular rate and rhythm Abdomen:  Soft, nontender and nondistended. Normal bowel sounds. Extremities:  Without edema. Neurologic:  Alert and oriented,  grossly normal neurologically. Psych:  Cooperative. Normal mood and affect.  Lab Results:  Recent Labs  05/17/14 0445  WBC 7.7  HGB 13.3  HCT 39.4  PLT 275   BMET  Recent Labs  05/18/14 0405 05/19/14 0403  NA 138 139  K 3.1* 3.7  CL 104 103  CO2 27 25  GLUCOSE 154* 153*  BUN 14 8  CREATININE 1.12 1.07  CALCIUM 8.4 8.6   LFT  Recent Labs  05/19/14 0403  PROT 6.8  ALBUMIN 3.6  AST 11  ALT 14  ALKPHOS 63  BILITOT 0.8      Assessment / Plan:   39 year old male with multiple admissions for nausea and vomiting. He has known diabetic gastroparesis. He was evaluated at [facility] but didn't return after losing insurance. He is getting IV erythromycin Q 6 hours, QID IV Reglan 5mg , BID PPI, Zofran 8mg  IV QID,  and Carafate TID. Also getting IV ativan. Currently not getting much in way of narcotics. Blood glucose 153 today but overall poor control with hgb a!c of 9.2 on 05/03/14.  Patient vomited fluids early this am. He couldn't do gastric emptying study because of vomiting yesterday. Not sure GES would be accurate anyway on Reglan and Erythromycin. Patient assumes he can't go back to [facility] because he lost his insurance. I am going to check into this. I think he needs to return there when necessary.      LOS: 8 days   Tye Savoy  05/19/2014, 11:09 AM   Attending MD note:   I have taken a history, examined the patient,  and reviewed the chart. I agree with the Advanced Practitioner's impression and recommendations. I have no new recommendations. I think he alsready had a psych evaluation.  Melburn Popper [facility] Pager # [phone]

## 2014-05-19 NOTE — Consult Note (Signed)
[facility] Consult   Reason for Consult:  depression Referring Physician:  Dr. Gerrie Nordmann Patient Identification: Duane [name] MRN:  [id number] Principal Diagnosis: Intractable vomiting with nausea Diagnosis:   Patient Active Problem List   Diagnosis Date Noted  . Intractable cyclical vomiting with nausea [G43.A1]   . Intractable vomiting with nausea [R11.10] 05/11/2014  . GERD (gastroesophageal reflux disease) [K21.9] 05/05/2014  . Noncompliance [Z91.19] 04/30/2014  . Nausea with vomiting [R11.2]   . Nausea & vomiting [R11.2] 02/03/2014  . Type I diabetes mellitus with complication, uncontrolled [E10.69]   . Essential hypertension [I10]   . Epigastric pain [R10.13] 12/31/2013  . Gastroparesis [K31.84] 10/14/2013  . Acute kidney injury [N17.9] 10/14/2013  . Hyperglycemia due to type 1 diabetes mellitus [E10.65] 10/14/2013  . Constipation [K59.00] 09/23/2013  . Diabetic neuropathy [E11.40] 08/28/2013  . Hypoglycemia [E16.2] 08/28/2013  . Diabetic gastroparesis [E11.43] 08/27/2013  . Hypokalemia [E87.6] 06/04/2013  . Intractable nausea and vomiting [R11.10] 05/30/2013  . IDDM (insulin dependent diabetes mellitus) [E11.9, Z79.4] 01/09/2013  . HTN (hypertension) [I10] 01/09/2013    Total Time spent with patient: 1 hour  Subjective:   Duane [name] is a 39 y.o. male patient admitted with depression.  HPI:  Duane [name] is a 39 y.o. male seen, chart reviewed and case discussed with the psychiatric social service. Patient reported feeling depressed, sad, loss of interest and enthusiasm, poor energy, disturbed sleep and feeling helpless and worthless. 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 Patient has been staying with his mother and stepfather. Patient never married and has 3 children and has failed contact with them. Reportedly difficult to maintain relationship because of distance. Patient reported he was  originally from [place] but stayed in [place] for last 3-1/2 years and came to mother and stepfather about a year ago. Patient reported he lost his health and lot of support system like friends since she has been isolated and withdrawn. Patient denies current suicidal, homicidal ideation, intention or plan. Patient has no evidence of psychosis. Patient has no previous history of mental health treatment either inpatient or outpatient. Patient contract for safety while in the hospital. Patient is willing to provide take medication for depression and insomnia.   Medical history: Patient with history of diabetes mellitus type 1 who has had recent multiple admissions for gastroparesis related nausea vomiting presents to the ER because of worsening nausea vomiting since last evening. Patient states he has been unable to keep anything because of the vomiting. Patient also has been having some epigastric discomfort. Denies any blood in the vomitus denies any diarrhea. Denies any fever chills chest pain or shortness of breath. In the ER patient had no diplopia episodes of vomiting and has been admitted for further management of nausea vomiting most likely from gastroparesis. Patient has had gastric emptying study in July 2015 which was showing delayed gastric emptying. EGD done in April 2015 was unremarkable. Patient states that he had no refills on his Reglan and has not taken it for a couple of days.   Review of Systems: As presented in the history of presenting illness, rest negative.  HPI Elements:   Location:  Depression and insomnia. Quality:  Decreased psychomotor activity, isolation, withdrawn and physically sick. Severity:  Moderate. Timing:  Diabetes with gastroparesis. Duration:  Few weeks. Context:  Psychosocial stresses, financial difficulties and health related problems.  Past Medical History:  Past Medical History  Diagnosis Date  . Hypertension   .  Diabetes mellitus without complication  age 26    insulin requiring from the start  . Gastritis   . Gastroparesis 2012    100% gastric retention on 08/2013 [facility]  . Obesity   . Diabetic neuropathy   . GERD (gastroesophageal reflux disease)     Past Surgical History  Procedure Laterality Date  . Cholecystectomy  2013  . Knee surgery Left   . Flexible sigmoidoscopy N/A 06/11/2013    Procedure: FLEXIBLE SIGMOIDOSCOPY;  Surgeon: [name], MD;  Location: [facility];  Service: Endoscopy;  Laterality: N/A;  . Esophagogastroduodenoscopy N/A 06/11/2013    Procedure: ESOPHAGOGASTRODUODENOSCOPY (EGD);  Surgeon: [name], MD;  Location: [facility];  Service: Endoscopy;  Laterality: N/A;   Family History:  Family History  Problem Relation Age of Onset  . Diabetes Father    Social History:  History  Alcohol Use  . Yes    Comment: "none really" - 08/27/13     History  Drug Use No    History   Social History  . Marital Status: Single    Spouse Name: N/A  . Number of Children: N/A  . Years of Education: N/A   Occupational History  . 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    Social History Main Topics  . Smoking status: Never Smoker   . Smokeless tobacco: Never Used  . Alcohol Use: Yes     Comment: "none really" - 08/27/13  . Drug Use: No  . Sexual Activity: Yes   Other Topics Concern  . None   Social History Narrative   Additional Social History:                          Allergies:  No Known Allergies  Labs:  Results for orders placed or performed during the hospital encounter of 05/10/14 (from the past 48 hour(s))  Glucose, capillary     Status: Abnormal   Collection Time: 05/17/14  5:04 PM  Result Value Ref Range   Glucose-Capillary 202 (H) 70 - 99 mg/dL   Comment 1 Notify RN   Glucose, capillary     Status: Abnormal   Collection Time: 05/17/14 10:14 PM  Result Value Ref Range   Glucose-Capillary 123 (H) 70 - 99 mg/dL   Comment 1 Notify RN    Comment 2  Document in Chart   Glucose, capillary     Status: Abnormal   Collection Time: 05/18/14 12:17 AM  Result Value Ref Range   Glucose-Capillary 181 (H) 70 - 99 mg/dL   Comment 1 Notify RN    Comment 2 Document in Chart   Basic metabolic panel     Status: Abnormal   Collection Time: 05/18/14  4:05 AM  Result Value Ref Range   Sodium 138 135 - 145 mmol/L   Potassium 3.1 (L) 3.5 - 5.1 mmol/L   Chloride 104 96 - 112 mmol/L   CO2 27 19 - 32 mmol/L   Glucose, Bld 154 (H) 70 - 99 mg/dL   BUN 14 6 - 23 mg/dL   Creatinine, Ser 1.12 0.50 - 1.35 mg/dL   Calcium 8.4 8.4 - 10.5 mg/dL   GFR calc non Af Amer 82 (L) >90 mL/min   GFR calc Af Amer >90 >90 mL/min    Comment: (NOTE) The eGFR has been calculated using the CKD EPI equation. This calculation has not been validated in all clinical  situations. eGFR's persistently <90 mL/min signify possible Chronic Kidney Disease.    Anion gap 7 5 - 15  Glucose, capillary     Status: Abnormal   Collection Time: 05/18/14  7:40 AM  Result Value Ref Range   Glucose-Capillary 126 (H) 70 - 99 mg/dL   Comment 1 Notify RN    Comment 2 Document in Chart   Glucose, capillary     Status: Abnormal   Collection Time: 05/18/14 11:11 AM  Result Value Ref Range   Glucose-Capillary 154 (H) 70 - 99 mg/dL  Glucose, capillary     Status: Abnormal   Collection Time: 05/18/14  5:15 PM  Result Value Ref Range   Glucose-Capillary 136 (H) 70 - 99 mg/dL  Glucose, capillary     Status: Abnormal   Collection Time: 05/18/14  9:55 PM  Result Value Ref Range   Glucose-Capillary 132 (H) 70 - 99 mg/dL   Comment 1 Notify RN    Comment 2 Document in Chart   Comprehensive metabolic panel     Status: Abnormal   Collection Time: 05/19/14  4:03 AM  Result Value Ref Range   Sodium 139 135 - 145 mmol/L   Potassium 3.7 3.5 - 5.1 mmol/L   Chloride 103 96 - 112 mmol/L   CO2 25 19 - 32 mmol/L   Glucose, Bld 153 (H) 70 - 99 mg/dL   BUN 8 6 - 23 mg/dL   Creatinine, Ser 1.07 0.50 -  1.35 mg/dL   Calcium 8.6 8.4 - 10.5 mg/dL   Total Protein 6.8 6.0 - 8.3 g/dL   Albumin 3.6 3.5 - 5.2 g/dL   AST 11 0 - 37 U/L   ALT 14 0 - 53 U/L   Alkaline Phosphatase 63 39 - 117 U/L   Total Bilirubin 0.8 0.3 - 1.2 mg/dL   GFR calc non Af Amer 87 (L) >90 mL/min   GFR calc Af Amer >90 >90 mL/min    Comment: (NOTE) The eGFR has been calculated using the CKD EPI equation. This calculation has not been validated in all clinical situations. eGFR's persistently <90 mL/min signify possible Chronic Kidney Disease.    Anion gap 11 5 - 15  Glucose, capillary     Status: Abnormal   Collection Time: 05/19/14  8:32 AM  Result Value Ref Range   Glucose-Capillary 158 (H) 70 - 99 mg/dL   Comment 1 Notify RN   Glucose, capillary     Status: Abnormal   Collection Time: 05/19/14 11:32 AM  Result Value Ref Range   Glucose-Capillary 128 (H) 70 - 99 mg/dL   Comment 1 Notify RN     Vitals: Blood pressure 148/86, pulse 72, temperature 98 F (36.7 C), temperature source Oral, resp. rate 18, height 6' 3"  (1.905 m), weight 125.193 kg (276 lb), SpO2 97 %.  Risk to Self: Is patient at risk for suicide?: No Risk to Others:   Prior Inpatient Therapy:   Prior Outpatient Therapy:    Current Facility-Administered Medications  Medication Dose Route Frequency Provider Last Rate Last Dose  . acetaminophen (TYLENOL) tablet 650 mg  650 mg Oral Q6H PRN [name], MD   650 mg at 05/11/14 1723   Or  . acetaminophen (TYLENOL) suppository 650 mg  650 mg Rectal Q6H PRN [name], MD      . amLODipine (NORVASC) tablet 10 mg  10 mg Oral q morning - 10a [name], MD   10 mg at 05/19/14 0951  .  enoxaparin (LOVENOX) injection 60 mg  60 mg Subcutaneous Q24H Anh P Pham, RPH   60 mg at 05/19/14 1351  . erythromycin 250 mg in sodium chloride 0.9 % 100 mL IVPB  250 mg Intravenous 4 times per day Albertine Patricia, MD   250 mg at 05/19/14 1229  . feeding supplement (GLUCERNA SHAKE) (GLUCERNA  SHAKE) liquid 237 mL  237 mL Oral TID BM Albertine Patricia, MD   237 mL at 05/17/14 2105  . gabapentin (NEURONTIN) capsule 300 mg  300 mg Oral QHS [name], MD   300 mg at 05/17/14 2106  . hydrALAZINE (APRESOLINE) injection 10 mg  10 mg Intravenous Q4H PRN [name], MD   10 mg at 05/14/14 0702  . insulin aspart (novoLOG) injection 0-9 Units  0-9 Units Subcutaneous TID WC [name], MD   1 Units at 05/19/14 1233  . insulin glargine (LANTUS) injection 18 Units  18 Units Subcutaneous Daily Albertine Patricia, MD   18 Units at 05/19/14 [phone]  . LORazepam (ATIVAN) 2 MG/ML concentrated solution 1 mg  1 mg Oral Q8H PRN Albertine Patricia, MD   1 mg at 05/19/14 0417  . metoCLOPramide (REGLAN) injection 5 mg  5 mg Intravenous 4 times per day Albertine Patricia, MD   5 mg at 05/19/14 1158  . metoprolol (LOPRESSOR) tablet 50 mg  50 mg Oral BID [name], MD   50 mg at 05/19/14 2440  . ondansetron (ZOFRAN) 8 mg/NS 50 ml IVPB  8 mg Intravenous 4 times per day Lafayette Dragon, MD   8 mg at 05/19/14 1155  . oxyCODONE (Oxy IR/ROXICODONE) immediate release tablet 5 mg  5 mg Oral Q4H PRN Albertine Patricia, MD   5 mg at 05/19/14 0411  . pantoprazole (PROTONIX) EC tablet 40 mg  40 mg Oral BID [name], MD   40 mg at 05/19/14 1027  . potassium chloride 20 MEQ/15ML (10%) solution 40 mEq  40 mEq Oral Once Albertine Patricia, MD   40 mEq at 05/18/14 1300  . promethazine (PHENERGAN) injection 25 mg  25 mg Intravenous Q6H PRN Albertine Patricia, MD   25 mg at 05/19/14 0552  . sodium chloride 0.9 % injection 10-40 mL  10-40 mL Intracatheter Q12H Bonnielee Haff, MD   10 mL at 05/19/14 0952  . sodium chloride 0.9 % injection 10-40 mL  10-40 mL Intracatheter PRN Bonnielee Haff, MD      . sucralfate (CARAFATE) 1 GM/10ML suspension 1 g  1 g Oral TID WC Albertine Patricia, MD   1 g at 05/19/14 1158    Musculoskeletal: Strength & Muscle Tone: decreased Gait & Station: unable to  stand Patient leans: N/A  Psychiatric Specialty Exam: Physical Exam as per history and physical examination   ROS depression, sad, isolated, withdrawn feeling worthless   Blood pressure 148/86, pulse 72, temperature 98 F (36.7 C), temperature source Oral, resp. rate 18, height 6' 3"  (1.905 m), weight 125.193 kg (276 lb), SpO2 97 %.Body mass index is 34.5 kg/(m^2).  General Appearance: Guarded  Eye Contact::  Good  Speech:  Clear and Coherent and Slow  Volume:  Decreased  Mood:  Depressed and Worthless  Affect:  Depressed and Flat  Thought Process:  Coherent and Goal Directed  Orientation:  Full (Time, Place, and Person)  Thought Content:  Rumination  Suicidal Thoughts:  No  Homicidal Thoughts:  No  Memory:  Immediate;  Good Recent;   Good Remote;   Good  Judgement:  Fair  Insight:  Fair  Psychomotor Activity:  Decreased  Concentration:  Good  Recall:  Good  Fund of Knowledge:Good  Language: Good  Akathisia:  Negative  Handed:  Right  AIMS (if indicated):     Assets:  Communication Skills Desire for Improvement Housing Intimacy Leisure Time Resilience Social Support Talents/Skills  ADL's:  Impaired  Cognition: WNL  Sleep:      Medical Decision Making: New problem, with additional work up planned, Review of Psycho-Social Stressors (1), Review or order clinical lab tests (1), Established Problem, Worsening (2), Review of Last Therapy Session (1), Review or order medicine tests (1), Review of Medication Regimen & Side Effects (2) and Review of New Medication or Change in Dosage (2)  Treatment Plan Summary: Daily contact with patient to assess and evaluate symptoms and progress in treatment and Medication management  Plan: Restart fluoxetine 10 mg daily for depression and trazodone 50 mg at bedtime for insomnia and monitor for the side effect of the medication Patient does not meet criteria for psychiatric inpatient admission. Supportive therapy provided about ongoing  stressors.  Appreciate psychiatric consultation and follow up as clinically required Please contact [phone] or [phone] if needs further assistance  Disposition: Patient will be referred to the outpatient psychiatric services for medication management and counseling services and medically stable.  Jaonna Word,JANARDHAHA R. 05/19/2014 2:19 PM

## 2014-05-20 LAB — GLUCOSE, CAPILLARY
GLUCOSE-CAPILLARY: 88 mg/dL (ref 70–99)
GLUCOSE-CAPILLARY: 135 mg/dL — AB (ref 70–99)
GLUCOSE-CAPILLARY: 132 mg/dL — AB (ref 70–99)
Glucose-Capillary: 166 mg/dL — ABNORMAL HIGH (ref 70–99)

## 2014-05-20 MED ORDER — KETOROLAC TROMETHAMINE 30 MG/ML IJ SOLN
30.0000 mg | Freq: Once | INTRAMUSCULAR | Status: AC
  Administered 2014-05-20: 30 mg via INTRAVENOUS
  Filled 2014-05-20: qty 1

## 2014-05-20 MED ORDER — OXYCODONE HCL 5 MG PO TABS
5.0000 mg | ORAL_TABLET | Freq: Once | ORAL | Status: AC
  Administered 2014-05-21: 5 mg via ORAL
  Filled 2014-05-20: qty 1

## 2014-05-20 NOTE — Progress Notes (Signed)
Patient Demographics  Duane [name], is a 39 y.o. male, DOB - 1975-05-24, [id number]  Outpatient Primary MD for the patient is NNODI, ADAKU, MD  LOS - 9 Days   Chief Complaint  Patient presents with  . Vomiting       Brief narrative: Duane [name] is a 38 y.o. male with history of diabetes mellitus type 1 who has had recent multiple admissions for gastroparesis related nausea vomiting. He presented with recurrent complaints of nausea and vomiting. He was admitted for further management. Patient has had gastric emptying study in July 2015 which was showing delayed gastric emptying. EGD done in April 2015 was unremarkable. Hospital course was complicated by recurrent nausea and vomiting.  Subjective:   Patient states that he hasn't vomited in the last 24 hours. Feels somewhat better.   Assessment & Plan    Principal Problem:   MDD (major depressive disorder), single episode, severe , no psychosis Active Problems:   HTN (hypertension)   Diabetic gastroparesis   Essential hypertension   Nausea & vomiting   Intractable vomiting with nausea   Intractable cyclical vomiting with nausea    Intractable nausea and vomiting secondary to diabetic gastroparesis. -Patient has had gastric emptying study in July 2015 which was showing delayed gastric emptying and also had EGD in April 2015 which was unremarkable. - Feels better this morning. Request Advance diet. We will advance to full liquids. - He is on Reglan, Zofran, Phenergan, erythromycin. Gastroenterology is following. - Patient was requesting IV narcotics, and IV Ativan. Had multiple discussions with the patient regarding that, and explained that our plan is to try to minimize opioids medication given its potential to worsen his gastroparesis.  Diabetes mellitus type 1 - hemoglobin A1c 9.2  - CBG is  acceptable on Lantus 18 units. - Continue with insulin sliding scale  Essential Hypertension - Continue with metoprolol, Norvasc, when necessary hydralazine  Hypokalemia - Repleted  DVT prophylaxis: Lovenox Code Status: Full Family Communication: Discussed with patient Disposition Plan: Home once stable   Procedures  None  Consults   Gastroenterology   Medications  Scheduled Meds: . amLODipine  10 mg Oral q morning - 10a  . enoxaparin (LOVENOX) injection  60 mg Subcutaneous Q24H  . erythromycin  250 mg Intravenous 4 times per day  . feeding supplement (GLUCERNA SHAKE)  237 mL Oral TID BM  . FLUoxetine  10 mg Oral Daily  . gabapentin  300 mg Oral QHS  . insulin aspart  0-9 Units Subcutaneous TID WC  . insulin glargine  18 Units Subcutaneous Daily  . metoCLOPramide (REGLAN) injection  5 mg Intravenous 4 times per day  . metoprolol  50 mg Oral BID  . ondansetron (ZOFRAN) IV  8 mg Intravenous 4 times per day  . pantoprazole  40 mg Oral BID  . potassium chloride  40 mEq Oral Once  . sodium chloride  10-40 mL Intracatheter Q12H  . sucralfate  1 g Oral TID WC  . traZODone  50 mg Oral QHS   Continuous Infusions:   PRN Meds:.acetaminophen **OR** acetaminophen, hydrALAZINE, LORazepam, oxyCODONE, promethazine, sodium chloride  Antibiotics    Anti-infectives    Start     Dose/Rate Route Frequency Ordered Stop   05/17/14 1300  erythromycin 250 mg in sodium chloride 0.9 % 100 mL IVPB     250 mg 100 mL/hr over 60 Minutes Intravenous 4 times per day 05/17/14 1135     05/15/14 1415  erythromycin 250 mg in sodium chloride 0.9 % 100 mL IVPB  Status:  Discontinued     250 mg 100 mL/hr over 60 Minutes Intravenous 4 times per day 05/15/14 1414 05/16/14 1627        Objective:   Filed Vitals:   05/19/14 0951 05/19/14 1400 05/19/14 2031 05/20/14 0523  BP: 151/94 148/86 126/72 130/83  Pulse: 87 72 81 107  Temp:  98 F (36.7 C) 98.1 F (36.7 C) 97.8 F (36.6 C)  TempSrc:   Oral Oral Oral  Resp:  18 18 18   Height:      Weight:      SpO2:  97% 97% 96%    Wt Readings from Last 3 Encounters:  05/11/14 125.193 kg (276 lb)  05/07/14 126.1 kg (278 lb)  05/05/14 121.7 kg (268 lb 4.8 oz)     Intake/Output Summary (Last 24 hours) at 05/20/14 0827 Last data filed at 05/19/14 1351  Gross per 24 hour  Intake    150 ml  Output      0 ml  Net    150 ml     Physical Exam  Awake Alert, in no distress Symmetrical Chest wall movement, Good air movement bilaterally, clear to auscultation.  S1, S2 normal, regular.4. No rubs, murmurs, or bruit. No pedal edema.  Abdomen is soft. Nontender. Nondistended. Bowel sounds are present. No masses or organomegaly.   Data Review  CBC  Recent Labs Lab 05/17/14 0445  WBC 7.7  HGB 13.3  HCT 39.4  PLT 275  MCV 81.2  MCH 27.4  MCHC 33.8  RDW 12.2    Chemistries   Recent Labs Lab 05/15/14 0625 05/18/14 0405 05/19/14 0403  NA 137 138 139  K 3.6 3.1* 3.7  CL 101 104 103  CO2 29 27 25   GLUCOSE 147* 154* 153*  BUN 11 14 8   CREATININE 1.18 1.12 1.07  CALCIUM 8.4 8.4 8.6  AST  --   --  11  ALT  --   --  14  ALKPHOS  --   --  63  BILITOT  --   --  0.8     Duane [name] M.D on 05/20/2014 at 8:27 AM  Between 7am to 7pm - Pager - [phone]  After 7pm go to [email or web address] - password [facility]  [facility]  Office  [phone]

## 2014-05-20 NOTE — Consult Note (Signed)
Psychiatry Consult progress note  Reason for Consult:  depression Referring Physician:  Dr. Gerrie Nordmann Patient Identification: Duane [name] MRN:  [id number] Principal Diagnosis: MDD (major depressive disorder), single episode, severe , no psychosis Diagnosis:   Patient Active Problem List   Diagnosis Date Noted  . MDD (major depressive disorder), single episode, severe , no psychosis [F32.2] 05/19/2014  . Intractable cyclical vomiting with nausea [G43.A1]   . Intractable vomiting with nausea [R11.10] 05/11/2014  . GERD (gastroesophageal reflux disease) [K21.9] 05/05/2014  . Noncompliance [Z91.19] 04/30/2014  . Nausea with vomiting [R11.2]   . Nausea & vomiting [R11.2] 02/03/2014  . Type I diabetes mellitus with complication, uncontrolled [E10.69]   . Essential hypertension [I10]   . Epigastric pain [R10.13] 12/31/2013  . Gastroparesis [K31.84] 10/14/2013  . Acute kidney injury [N17.9] 10/14/2013  . Hyperglycemia due to type 1 diabetes mellitus [E10.65] 10/14/2013  . Constipation [K59.00] 09/23/2013  . Diabetic neuropathy [E11.40] 08/28/2013  . Hypoglycemia [E16.2] 08/28/2013  . Diabetic gastroparesis [E11.43] 08/27/2013  . Hypokalemia [E87.6] 06/04/2013  . Intractable nausea and vomiting [R11.10] 05/30/2013  . IDDM (insulin dependent diabetes mellitus) [E11.9, Z79.4] 01/09/2013  . HTN (hypertension) [I10] 01/09/2013    Total Time spent with patient: 30 minutes  Subjective:   Duane [name] is a 39 y.o. male patient admitted with depression.  HPI:  Duane [name] is a 39 y.o. male seen, chart reviewed and case discussed with the psychiatric social service. Patient reported feeling depressed, sad, loss of interest and enthusiasm, poor energy, disturbed sleep and feeling helpless and worthless. 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 Patient has been staying with his mother and stepfather. Patient never married and has 3 children  and has failed contact with them. Reportedly difficult to maintain relationship because of distance. Patient reported he was originally from [place] but stayed in [place] for last 3-1/2 years and came to mother and stepfather about a year ago. Patient reported he lost his health and lot of support system like friends since she has been isolated and withdrawn. Patient denies current suicidal, homicidal ideation, intention or plan. Patient has no evidence of psychosis. Patient has no previous history of mental health treatment either inpatient or outpatient. Patient contract for safety while in the hospital. Patient is willing to provide take medication for depression and insomnia.   Interval history: Patient seen for psychiatric consultation follow-up today. Patient reportedly compliant with his medication management, tolerating well without side effects. Patient reported he has been in contact with his mother and continue to get moral support. Patient feels no change in his emotions since yesterday but felt somewhat optimistic and feels he is going to be getting better. Patient has no reported vomiting's and able to keep oral intake without much difficulty. Patient reportedly had a restful sleep last night. Patient has no safety concerns at this time.   Medical history: Patient with history of diabetes mellitus type 1 who has had recent multiple admissions for gastroparesis related nausea vomiting presents to the ER because of worsening nausea vomiting since last evening. Patient states he has been unable to keep anything because of the vomiting. Patient also has been having some epigastric discomfort. Denies any blood in the vomitus denies any diarrhea. Denies any fever chills chest pain or shortness of breath. In the ER patient had no diplopia episodes of vomiting and has been admitted for further management of nausea vomiting most likely from gastroparesis. Patient has had gastric emptying  study in July  2015 which was showing delayed gastric emptying. EGD done in April 2015 was unremarkable. Patient states that he had no refills on his Reglan and has not taken it for a couple of days.    Past Medical History:  Past Medical History  Diagnosis Date  . Hypertension   . Diabetes mellitus without complication age 8    insulin requiring from the start  . Gastritis   . Gastroparesis 2012    100% gastric retention on 08/2013 [facility]  . Obesity   . Diabetic neuropathy   . GERD (gastroesophageal reflux disease)     Past Surgical History  Procedure Laterality Date  . Cholecystectomy  2013  . Knee surgery Left   . Flexible sigmoidoscopy N/A 06/11/2013    Procedure: FLEXIBLE SIGMOIDOSCOPY;  Surgeon: [name], MD;  Location: [facility];  Service: Endoscopy;  Laterality: N/A;  . Esophagogastroduodenoscopy N/A 06/11/2013    Procedure: ESOPHAGOGASTRODUODENOSCOPY (EGD);  Surgeon: [name], MD;  Location: [facility];  Service: Endoscopy;  Laterality: N/A;   Family History:  Family History  Problem Relation Age of Onset  . Diabetes Father    Social History:  History  Alcohol Use  . Yes    Comment: "none really" - 08/27/13     History  Drug Use No    History   Social History  . Marital Status: Single    Spouse Name: N/A  . Number of Children: N/A  . Years of Education: N/A   Occupational History  . 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    Social History Main Topics  . Smoking status: Never Smoker   . Smokeless tobacco: Never Used  . Alcohol Use: Yes     Comment: "none really" - 08/27/13  . Drug Use: No  . Sexual Activity: Yes   Other Topics Concern  . None   Social History Narrative   Additional Social History:                          Allergies:  No Known Allergies  Labs:  Results for orders placed or performed during the hospital encounter of 05/10/14 (from the past 48 hour(s))  Glucose, capillary     Status: Abnormal    Collection Time: 05/18/14  5:15 PM  Result Value Ref Range   Glucose-Capillary 136 (H) 70 - 99 mg/dL  Glucose, capillary     Status: Abnormal   Collection Time: 05/18/14  9:55 PM  Result Value Ref Range   Glucose-Capillary 132 (H) 70 - 99 mg/dL   Comment 1 Notify RN    Comment 2 Document in Chart   Comprehensive metabolic panel     Status: Abnormal   Collection Time: 05/19/14  4:03 AM  Result Value Ref Range   Sodium 139 135 - 145 mmol/L   Potassium 3.7 3.5 - 5.1 mmol/L   Chloride 103 96 - 112 mmol/L   CO2 25 19 - 32 mmol/L   Glucose, Bld 153 (H) 70 - 99 mg/dL   BUN 8 6 - 23 mg/dL   Creatinine, Ser 1.07 0.50 - 1.35 mg/dL   Calcium 8.6 8.4 - 10.5 mg/dL   Total Protein 6.8 6.0 - 8.3 g/dL   Albumin 3.6 3.5 - 5.2 g/dL   AST 11 0 - 37 U/L   ALT 14 0 - 53 U/L   Alkaline Phosphatase 63 39 -  117 U/L   Total Bilirubin 0.8 0.3 - 1.2 mg/dL   GFR calc non Af Amer 87 (L) >90 mL/min   GFR calc Af Amer >90 >90 mL/min    Comment: (NOTE) The eGFR has been calculated using the CKD EPI equation. This calculation has not been validated in all clinical situations. eGFR's persistently <90 mL/min signify possible Chronic Kidney Disease.    Anion gap 11 5 - 15  Glucose, capillary     Status: Abnormal   Collection Time: 05/19/14  8:32 AM  Result Value Ref Range   Glucose-Capillary 158 (H) 70 - 99 mg/dL   Comment 1 Notify RN   Glucose, capillary     Status: Abnormal   Collection Time: 05/19/14 11:32 AM  Result Value Ref Range   Glucose-Capillary 128 (H) 70 - 99 mg/dL   Comment 1 Notify RN   Glucose, capillary     Status: None   Collection Time: 05/19/14  5:25 PM  Result Value Ref Range   Glucose-Capillary 98 70 - 99 mg/dL   Comment 1 Notify RN   Glucose, capillary     Status: Abnormal   Collection Time: 05/19/14  8:36 PM  Result Value Ref Range   Glucose-Capillary 255 (H) 70 - 99 mg/dL  Glucose, capillary     Status: None   Collection Time: 05/20/14  7:50 AM  Result Value Ref Range    Glucose-Capillary 88 70 - 99 mg/dL   Comment 1 Notify RN   Glucose, capillary     Status: Abnormal   Collection Time: 05/20/14 11:53 AM  Result Value Ref Range   Glucose-Capillary 135 (H) 70 - 99 mg/dL   Comment 1 Notify RN     Vitals: Blood pressure 130/83, pulse 107, temperature 97.8 F (36.6 C), temperature source Oral, resp. rate 18, height 6' 3"  (1.905 m), weight 125.193 kg (276 lb), SpO2 96 %.  Risk to Self: Is patient at risk for suicide?: No Risk to Others:   Prior Inpatient Therapy:   Prior Outpatient Therapy:    Current Facility-Administered Medications  Medication Dose Route Frequency Provider Last Rate Last Dose  . acetaminophen (TYLENOL) tablet 650 mg  650 mg Oral Q6H PRN [name], MD   650 mg at 05/11/14 1723   Or  . acetaminophen (TYLENOL) suppository 650 mg  650 mg Rectal Q6H PRN [name], MD      . amLODipine (NORVASC) tablet 10 mg  10 mg Oral q morning - 10a [name], MD   10 mg at 05/20/14 1136  . enoxaparin (LOVENOX) injection 60 mg  60 mg Subcutaneous Q24H Anh P Pham, RPH   60 mg at 05/19/14 1351  . erythromycin 250 mg in sodium chloride 0.9 % 100 mL IVPB  250 mg Intravenous 4 times per day Albertine Patricia, MD   250 mg at 05/20/14 0553  . feeding supplement (GLUCERNA SHAKE) (GLUCERNA SHAKE) liquid 237 mL  237 mL Oral TID BM Albertine Patricia, MD   237 mL at 05/17/14 2105  . FLUoxetine (PROZAC) capsule 10 mg  10 mg Oral Daily Ambrose Finland, MD   10 mg at 05/20/14 1135  . gabapentin (NEURONTIN) capsule 300 mg  300 mg Oral QHS [name], MD   300 mg at 05/19/14 2226  . hydrALAZINE (APRESOLINE) injection 10 mg  10 mg Intravenous Q4H PRN [name], MD   10 mg at 05/14/14 0702  . insulin aspart (novoLOG) injection 0-9 Units  0-9 Units Subcutaneous TID WC [name], MD   1 Units at 05/19/14 1233  . insulin glargine (LANTUS) injection 18 Units  18 Units Subcutaneous Daily Albertine Patricia, MD    18 Units at 05/20/14 1137  . LORazepam (ATIVAN) 2 MG/ML concentrated solution 1 mg  1 mg Oral Q8H PRN Albertine Patricia, MD   1 mg at 05/19/14 0417  . metoCLOPramide (REGLAN) injection 5 mg  5 mg Intravenous 4 times per day Albertine Patricia, MD   5 mg at 05/20/14 0533  . metoprolol (LOPRESSOR) tablet 50 mg  50 mg Oral BID [name], MD   50 mg at 05/20/14 1136  . ondansetron (ZOFRAN) 8 mg/NS 50 ml IVPB  8 mg Intravenous 4 times per day Lafayette Dragon, MD   8 mg at 05/20/14 0533  . oxyCODONE (Oxy IR/ROXICODONE) immediate release tablet 5 mg  5 mg Oral Q4H PRN Albertine Patricia, MD   5 mg at 05/20/14 0037  . pantoprazole (PROTONIX) EC tablet 40 mg  40 mg Oral BID [name], MD   40 mg at 05/20/14 1135  . potassium chloride 20 MEQ/15ML (10%) solution 40 mEq  40 mEq Oral Once Albertine Patricia, MD   40 mEq at 05/18/14 1300  . promethazine (PHENERGAN) injection 25 mg  25 mg Intravenous Q6H PRN Albertine Patricia, MD   25 mg at 05/20/14 0750  . sodium chloride 0.9 % injection 10-40 mL  10-40 mL Intracatheter Q12H Bonnielee Haff, MD   10 mL at 05/19/14 2226  . sodium chloride 0.9 % injection 10-40 mL  10-40 mL Intracatheter PRN Bonnielee Haff, MD      . sucralfate (CARAFATE) 1 GM/10ML suspension 1 g  1 g Oral TID WC Albertine Patricia, MD   1 g at 05/20/14 1136  . traZODone (DESYREL) tablet 50 mg  50 mg Oral QHS Ambrose Finland, MD   50 mg at 05/19/14 2226    Musculoskeletal: Strength & Muscle Tone: decreased Gait & Station: unable to stand Patient leans: N/A  Psychiatric Specialty Exam: Physical Exam as per history and physical examination   ROS depression, sad, isolated, withdrawn feeling worthless   Blood pressure 130/83, pulse 107, temperature 97.8 F (36.6 C), temperature source Oral, resp. rate 18, height 6' 3"  (1.905 m), weight 125.193 kg (276 lb), SpO2 96 %.Body mass index is 34.5 kg/(m^2).  General Appearance: Guarded  Eye Contact::  Good  Speech:  Clear  and Coherent and Slow  Volume:  Decreased  Mood:  Depressed and Worthless  Affect:  Depressed and Flat  Thought Process:  Coherent and Goal Directed  Orientation:  Full (Time, Place, and Person)  Thought Content:  Rumination  Suicidal Thoughts:  No  Homicidal Thoughts:  No  Memory:  Immediate;   Good Recent;   Good Remote;   Good  Judgement:  Fair  Insight:  Fair  Psychomotor Activity:  Decreased  Concentration:  Good  Recall:  Good  Fund of Knowledge:Good  Language: Good  Akathisia:  Negative  Handed:  Right  AIMS (if indicated):     Assets:  Communication Skills Desire for Improvement Housing Intimacy Leisure Time Resilience Social Support Talents/Skills  ADL's:  Impaired  Cognition: WNL  Sleep:      Medical Decision Making: New problem, with additional work up planned, Review of Psycho-Social Stressors (1), Review or order clinical lab tests (1), Established Problem, Worsening (2), Review of Last Therapy Session (1), Review  or order medicine tests (1), Review of Medication Regimen & Side Effects (2) and Review of New Medication or Change in Dosage (2)  Treatment Plan Summary: Daily contact with patient to assess and evaluate symptoms and progress in treatment and Medication management  Plan:  Continue Fluoxetine 10 mg daily for depression  Continue Trazodone 50 mg at bedtime for insomnia  Monitor for the side effect of the medication Patient does not meet criteria for psychiatric inpatient admission. Supportive therapy provided about ongoing stressors.  Appreciate psychiatric consultation and follow up as clinically required Please contact [phone] or [phone] if needs further assistance  Disposition: Patient will be referred to the outpatient psychiatric services for medication management and counseling services and medically stable.  Duane [name],JANARDHAHA R. 05/20/2014 12:40 PM

## 2014-05-20 NOTE — Care Management Note (Signed)
CARE MANAGEMENT NOTE 05/20/2014  Patient:  Duane [name], Duane [name]   Account Number:  [id number]  Date Initiated:  05/13/2014  Documentation initiated by:  Marney Doctor  Subjective/Objective Assessment:   39 yo admitted with Intractable Nausea and Vomitting.     Action/Plan:   From home with family   Anticipated DC Date:  05/21/2014   Anticipated DC Plan:  [place]  CM consult      Choice offered to / List presented to:          [facility] arranged  [place].   Status of service:  Completed, signed off Medicare Important Message given?   (If response is "NO", the following Medicare IM given date fields will be blank) Date Medicare IM given:   Medicare IM given by:   Date Additional Medicare IM given:   Additional Medicare IM given by:    Discharge Disposition:    Per UR Regulation:  Reviewed for med. necessity/level of care/duration of stay  If discussed at [place] of Stay Meetings, dates discussed:    Comments:  05/20/14 Marney Doctor RN,BSN,NCM Pt was identified as a High Risk for Re-Admission pt. [facility] will be following pt at [facility] to help pt with close medical management to hopefully keep him out of the hospital in the future. Pt informed that he would be receiving [facility] services at DC and he is agreeable to this. Will need MD order for HHRN/CSW. Note left for MD in sticky notes. CM will continue to follow.  05/13/14 Marney Doctor RN,BSN,NCM [phone] Pt has had 3 hospital admissions recently for gastroparesis. I met with pt at bedside to discuss resources. Pt has an appointment at the [facility] on 05/21/14. Pt states he has a glucose meter at home and he recently got a new prescription for test strips and lancets that he needs to get filled. Pt also got prescriptions for his medications and insulin when he was recently in the hospital. Pt was instructed to take all his  prescriptions over to the [facility] pharmacy when he's DC'd today to get his prescriptions. He was instructed that the [facility] pharmacy could potentially give him the meds for the lowest cost. He states he will do this. No other CM needs noted.

## 2014-05-21 ENCOUNTER — Ambulatory Visit: Payer: Self-pay | Admitting: Internal Medicine

## 2014-05-21 ENCOUNTER — Telehealth: Payer: Self-pay | Admitting: *Deleted

## 2014-05-21 DIAGNOSIS — E119 Type 2 diabetes mellitus without complications: Secondary | ICD-10-CM

## 2014-05-21 LAB — GLUCOSE, CAPILLARY
GLUCOSE-CAPILLARY: 221 mg/dL — AB (ref 70–99)
Glucose-Capillary: 110 mg/dL — ABNORMAL HIGH (ref 70–99)
Glucose-Capillary: 103 mg/dL — ABNORMAL HIGH (ref 70–99)
Glucose-Capillary: 100 mg/dL — ABNORMAL HIGH (ref 70–99)

## 2014-05-21 LAB — BASIC METABOLIC PANEL
ANION GAP: 9 (ref 5–15)
BUN: 10 mg/dL (ref 6–23)
CO2: 26 mmol/L (ref 19–32)
Calcium: 8.6 mg/dL (ref 8.4–10.5)
Chloride: 103 mmol/L (ref 96–112)
Creatinine, Ser: 1.36 mg/dL — ABNORMAL HIGH (ref 0.50–1.35)
GFR, EST AFRICAN AMERICAN: 76 mL/min — AB (ref >90)
GFR, EST NON AFRICAN AMERICAN: 65 mL/min — AB (ref >90)
Glucose, Bld: 139 mg/dL — ABNORMAL HIGH (ref 70–99)
POTASSIUM: 3.9 mmol/L (ref 3.5–5.1)
Sodium: 138 mmol/L (ref 135–145)

## 2014-05-21 MED ORDER — LORAZEPAM 0.5 MG PO TABS
0.5000 mg | ORAL_TABLET | Freq: Two times a day (BID) | ORAL | Status: DC
  Administered 2014-05-21 – 2014-05-24 (×7): 0.5 mg via ORAL
  Filled 2014-05-21 (×7): qty 1

## 2014-05-21 MED ORDER — LORAZEPAM 2 MG/ML IJ SOLN
0.5000 mg | Freq: Four times a day (QID) | INTRAMUSCULAR | Status: DC | PRN
Start: 2014-05-21 — End: 2014-05-24

## 2014-05-21 MED ORDER — METOCLOPRAMIDE HCL 5 MG/ML IJ SOLN
10.0000 mg | Freq: Four times a day (QID) | INTRAMUSCULAR | Status: DC
Start: 2014-05-21 — End: 2014-05-23
  Administered 2014-05-21 – 2014-05-23 (×7): 10 mg via INTRAVENOUS
  Filled 2014-05-21 (×8): qty 2

## 2014-05-21 MED ORDER — SODIUM CHLORIDE 0.9 % IV SOLN
INTRAVENOUS | Status: AC
  Administered 2014-05-21 (×2): via INTRAVENOUS

## 2014-05-21 NOTE — Consult Note (Signed)
Psychiatry Consult progress note  Reason for Consult:  depression Referring Physician:  Dr. Gerrie Nordmann Patient Identification: Duane [name] MRN:  [id number] Principal Diagnosis: MDD (major depressive disorder), single episode, severe , no psychosis Diagnosis:   Patient Active Problem List   Diagnosis Date Noted  . MDD (major depressive disorder), single episode, severe , no psychosis [F32.2] 05/19/2014  . Intractable cyclical vomiting with nausea [G43.A1]   . Intractable vomiting with nausea [R11.10] 05/11/2014  . GERD (gastroesophageal reflux disease) [K21.9] 05/05/2014  . Noncompliance [Z91.19] 04/30/2014  . Nausea with vomiting [R11.2]   . Nausea & vomiting [R11.2] 02/03/2014  . Type I diabetes mellitus with complication, uncontrolled [E10.69]   . Essential hypertension [I10]   . Epigastric pain [R10.13] 12/31/2013  . Gastroparesis [K31.84] 10/14/2013  . Acute kidney injury [N17.9] 10/14/2013  . Hyperglycemia due to type 1 diabetes mellitus [E10.65] 10/14/2013  . Constipation [K59.00] 09/23/2013  . Diabetic neuropathy [E11.40] 08/28/2013  . Hypoglycemia [E16.2] 08/28/2013  . Diabetic gastroparesis [E11.43] 08/27/2013  . Hypokalemia [E87.6] 06/04/2013  . Intractable nausea and vomiting [R11.10] 05/30/2013  . IDDM (insulin dependent diabetes mellitus) [E11.9, Z79.4] 01/09/2013  . HTN (hypertension) [I10] 01/09/2013    Total Time spent with patient: 30 minutes  Subjective:   Duane [name] is a 39 y.o. male patient admitted with depression.  HPI:  Duane [name] is a 38 y.o. male seen, chart reviewed and case discussed with the psychiatric social service. Patient reported feeling depressed, sad, loss of interest and enthusiasm, poor energy, disturbed sleep and feeling helpless and worthless. 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 Patient has been staying with his mother and stepfather. Patient never married and has 3 children  and has failed contact with them. Reportedly difficult to maintain relationship because of distance. Patient reported he was originally from [place] but stayed in [place] for last 3-1/2 years and came to mother and stepfather about a year ago. Patient reported he lost his health and lot of support system like friends since she has been isolated and withdrawn. Patient denies current suicidal, homicidal ideation, intention or plan. Patient has no evidence of psychosis. Patient has no previous history of mental health treatment either inpatient or outpatient. Patient contract for safety while in the hospital. Patient is willing to provide take medication for depression and insomnia.   05/21/2014  Interval history: Patient complains feeling tired, weak secondary to having difficult night's secondary to reoccurrence of nausea and vomiting. Patient reportedly vomited about 10 times and not able to keep food or liquids since yesterday afternoon. Patient  reportedly feeling somewhat down and depressed but denies suicidal/homicidal ideation, intention or plans. Patient reported he has been in contact with his mother and continue to get moral support.    Medical history: Patient with history of diabetes mellitus type 1 who has had recent multiple admissions for gastroparesis related nausea vomiting presents to the ER because of worsening nausea vomiting since last evening. Patient states he has been unable to keep anything because of the vomiting. Patient also has been having some epigastric discomfort. Denies any blood in the vomitus denies any diarrhea. Denies any fever chills chest pain or shortness of breath. In the ER patient had no diplopia episodes of vomiting and has been admitted for further management of nausea vomiting most likely from gastroparesis. Patient has had gastric emptying study in July 2015 which was showing delayed gastric emptying. EGD done in April 2015 was unremarkable. Patient states  that he had no refills on his Reglan and has not taken it for a couple of days.    Past Medical History:  Past Medical History  Diagnosis Date  . Hypertension   . Diabetes mellitus without complication age 26    insulin requiring from the start  . Gastritis   . Gastroparesis 2012    100% gastric retention on 08/2013 [facility]  . Obesity   . Diabetic neuropathy   . GERD (gastroesophageal reflux disease)     Past Surgical History  Procedure Laterality Date  . Cholecystectomy  2013  . Knee surgery Left   . Flexible sigmoidoscopy N/A 06/11/2013    Procedure: FLEXIBLE SIGMOIDOSCOPY;  Surgeon: [name], MD;  Location: [facility];  Service: Endoscopy;  Laterality: N/A;  . Esophagogastroduodenoscopy N/A 06/11/2013    Procedure: ESOPHAGOGASTRODUODENOSCOPY (EGD);  Surgeon: [name], MD;  Location: [facility];  Service: Endoscopy;  Laterality: N/A;   Family History:  Family History  Problem Relation Age of Onset  . Diabetes Father    Social History:  History  Alcohol Use  . Yes    Comment: "none really" - 08/27/13     History  Drug Use No    History   Social History  . Marital Status: Single    Spouse Name: N/A  . Number of Children: N/A  . Years of Education: N/A   Occupational History  . 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    Social History Main Topics  . Smoking status: Never Smoker   . Smokeless tobacco: Never Used  . Alcohol Use: Yes     Comment: "none really" - 08/27/13  . Drug Use: No  . Sexual Activity: Yes   Other Topics Concern  . None   Social History Narrative   Additional Social History:                          Allergies:  No Known Allergies  Labs:  Results for orders placed or performed during the hospital encounter of 05/10/14 (from the past 48 hour(s))  Glucose, capillary     Status: Abnormal   Collection Time: 05/19/14 11:32 AM  Result Value Ref Range   Glucose-Capillary 128 (H) 70 - 99 mg/dL    Comment 1 Notify RN   Glucose, capillary     Status: None   Collection Time: 05/19/14  5:25 PM  Result Value Ref Range   Glucose-Capillary 98 70 - 99 mg/dL   Comment 1 Notify RN   Glucose, capillary     Status: Abnormal   Collection Time: 05/19/14  8:36 PM  Result Value Ref Range   Glucose-Capillary 255 (H) 70 - 99 mg/dL  Glucose, capillary     Status: None   Collection Time: 05/20/14  7:50 AM  Result Value Ref Range   Glucose-Capillary 88 70 - 99 mg/dL   Comment 1 Notify RN   Glucose, capillary     Status: Abnormal   Collection Time: 05/20/14 11:53 AM  Result Value Ref Range   Glucose-Capillary 135 (H) 70 - 99 mg/dL   Comment 1 Notify RN   Glucose, capillary     Status: Abnormal   Collection Time: 05/20/14  4:51 PM  Result Value Ref Range   Glucose-Capillary 166 (H) 70 - 99 mg/dL   Comment 1 Notify RN   Glucose, capillary     Status: Abnormal  Collection Time: 05/20/14  9:26 PM  Result Value Ref Range   Glucose-Capillary 132 (H) 70 - 99 mg/dL  Basic metabolic panel     Status: Abnormal   Collection Time: 05/21/14  6:10 AM  Result Value Ref Range   Sodium 138 135 - 145 mmol/L   Potassium 3.9 3.5 - 5.1 mmol/L   Chloride 103 96 - 112 mmol/L   CO2 26 19 - 32 mmol/L   Glucose, Bld 139 (H) 70 - 99 mg/dL   BUN 10 6 - 23 mg/dL   Creatinine, Ser 1.36 (H) 0.50 - 1.35 mg/dL   Calcium 8.6 8.4 - 10.5 mg/dL   GFR calc non Af Amer 65 (L) >90 mL/min   GFR calc Af Amer 76 (L) >90 mL/min    Comment: (NOTE) The eGFR has been calculated using the CKD EPI equation. This calculation has not been validated in all clinical situations. eGFR's persistently <90 mL/min signify possible Chronic Kidney Disease.    Anion gap 9 5 - 15  Glucose, capillary     Status: Abnormal   Collection Time: 05/21/14  7:35 AM  Result Value Ref Range   Glucose-Capillary 110 (H) 70 - 99 mg/dL   Comment 1 Notify RN     Vitals: Blood pressure 99/53, pulse 94, temperature 98.4 F (36.9 C), temperature  source Oral, resp. rate 16, height 6' 3"  (1.905 m), weight 125.193 kg (276 lb), SpO2 98 %.  Risk to Self: Is patient at risk for suicide?: No Risk to Others:   Prior Inpatient Therapy:   Prior Outpatient Therapy:    Current Facility-Administered Medications  Medication Dose Route Frequency Provider Last Rate Last Dose  . 0.9 %  sodium chloride infusion   Intravenous Continuous Bonnielee Haff, MD      . acetaminophen (TYLENOL) tablet 650 mg  650 mg Oral Q6H PRN [name], MD   650 mg at 05/11/14 1723   Or  . acetaminophen (TYLENOL) suppository 650 mg  650 mg Rectal Q6H PRN [name], MD      . amLODipine (NORVASC) tablet 10 mg  10 mg Oral q morning - 10a [name], MD   10 mg at 05/20/14 1136  . enoxaparin (LOVENOX) injection 60 mg  60 mg Subcutaneous Q24H Anh P Pham, RPH   60 mg at 05/20/14 1438  . erythromycin 250 mg in sodium chloride 0.9 % 100 mL IVPB  250 mg Intravenous 4 times per day Albertine Patricia, MD   250 mg at 05/21/14 [phone]  . feeding supplement (GLUCERNA SHAKE) (GLUCERNA SHAKE) liquid 237 mL  237 mL Oral TID BM Albertine Patricia, MD   237 mL at 05/17/14 2105  . FLUoxetine (PROZAC) capsule 10 mg  10 mg Oral Daily Ambrose Finland, MD   10 mg at 05/20/14 1135  . gabapentin (NEURONTIN) capsule 300 mg  300 mg Oral QHS [name], MD   300 mg at 05/19/14 2226  . hydrALAZINE (APRESOLINE) injection 10 mg  10 mg Intravenous Q4H PRN [name], MD   10 mg at 05/14/14 0702  . insulin aspart (novoLOG) injection 0-9 Units  0-9 Units Subcutaneous TID WC [name], MD   2 Units at 05/20/14 1853  . insulin glargine (LANTUS) injection 18 Units  18 Units Subcutaneous Daily Albertine Patricia, MD   18 Units at 05/20/14 1137  . LORazepam (ATIVAN) injection 0.5 mg  0.5 mg Intravenous Q6H PRN Bonnielee Haff, MD      .  metoCLOPramide (REGLAN) injection 5 mg  5 mg Intravenous 4 times per day Albertine Patricia, MD   5 mg at 05/21/14  0612  . metoprolol (LOPRESSOR) tablet 50 mg  50 mg Oral BID [name], MD   50 mg at 05/20/14 1136  . ondansetron (ZOFRAN) 8 mg/NS 50 ml IVPB  8 mg Intravenous 4 times per day Lafayette Dragon, MD   8 mg at 05/21/14 9518  . oxyCODONE (Oxy IR/ROXICODONE) immediate release tablet 5 mg  5 mg Oral Q4H PRN Albertine Patricia, MD   5 mg at 05/21/14 0053  . pantoprazole (PROTONIX) EC tablet 40 mg  40 mg Oral BID [name], MD   40 mg at 05/20/14 1135  . potassium chloride 20 MEQ/15ML (10%) solution 40 mEq  40 mEq Oral Once Albertine Patricia, MD   40 mEq at 05/18/14 1300  . promethazine (PHENERGAN) injection 25 mg  25 mg Intravenous Q6H PRN Albertine Patricia, MD   25 mg at 05/21/14 0756  . sodium chloride 0.9 % injection 10-40 mL  10-40 mL Intracatheter Q12H Bonnielee Haff, MD   10 mL at 05/20/14 2207  . sodium chloride 0.9 % injection 10-40 mL  10-40 mL Intracatheter PRN Bonnielee Haff, MD      . sucralfate (CARAFATE) 1 GM/10ML suspension 1 g  1 g Oral TID WC Albertine Patricia, MD   1 g at 05/21/14 0816  . traZODone (DESYREL) tablet 50 mg  50 mg Oral QHS Ambrose Finland, MD   50 mg at 05/19/14 2226    Musculoskeletal: Strength & Muscle Tone: decreased Gait & Station: unable to stand Patient leans: N/A  Psychiatric Specialty Exam: Physical Exam as per history and physical examination   ROS depression, sad, isolated, withdrawn feeling worthless   Blood pressure 99/53, pulse 94, temperature 98.4 F (36.9 C), temperature source Oral, resp. rate 16, height 6' 3"  (1.905 m), weight 125.193 kg (276 lb), SpO2 98 %.Body mass index is 34.5 kg/(m^2).  General Appearance: Guarded  Eye Contact::  Good  Speech:  Clear and Coherent and Slow  Volume:  Decreased  Mood:  Depressed and Worthless  Affect:  Depressed and Flat  Thought Process:  Coherent and Goal Directed  Orientation:  Full (Time, Place, and Person)  Thought Content:  Rumination  Suicidal Thoughts:  No  Homicidal  Thoughts:  No  Memory:  Immediate;   Good Recent;   Good Remote;   Good  Judgement:  Fair  Insight:  Fair  Psychomotor Activity:  Decreased  Concentration:  Good  Recall:  Good  Fund of Knowledge:Good  Language: Good  Akathisia:  Negative  Handed:  Right  AIMS (if indicated):     Assets:  Communication Skills Desire for Improvement Housing Intimacy Leisure Time Resilience Social Support Talents/Skills  ADL's:  Impaired  Cognition: WNL  Sleep:      Medical Decision Making: New problem, with additional work up planned, Review of Psycho-Social Stressors (1), Review or order clinical lab tests (1), Established Problem, Worsening (2), Review of Last Therapy Session (1), Review or order medicine tests (1), Review of Medication Regimen & Side Effects (2) and Review of New Medication or Change in Dosage (2)  Treatment Plan Summary: Daily contact with patient to assess and evaluate symptoms and progress in treatment and Medication management  Plan:  Continue Fluoxetine 10 mg daily for depression  Continue Trazodone 50 mg at bedtime for insomnia  Monitor for the side effect of  the medication Patient does not meet criteria for psychiatric inpatient admission. Supportive therapy provided about ongoing stressors.  Appreciate psychiatric consultation and follow up as clinically required Please contact [phone] or [phone] if needs further assistance  Disposition: Patient will be referred to the outpatient psychiatric services for medication management and counseling services and medically stable.  Cheri Ayotte,JANARDHAHA R. 05/21/2014 11:07 AM

## 2014-05-21 NOTE — Progress Notes (Signed)
Patient Demographics  Duane [name], is a 39 y.o. male, DOB - 1975-04-27, [id number]  Outpatient Primary MD for the patient is NNODI, ADAKU, MD  LOS - 10 Days   Chief Complaint  Patient presents with  . Vomiting       Brief narrative: Duane [name] is a 39 y.o. male with history of diabetes mellitus type 1 who has had recent multiple admissions for gastroparesis related nausea vomiting. He presented with recurrent complaints of nausea and vomiting. He was admitted for further management. Patient has had gastric emptying study in July 2015 which was showing delayed gastric emptying. EGD done in April 2015 was unremarkable. Hospital course complicated by recurrent nausea and vomiting.  Subjective:   Patient states that he vomited about 10 times in the last 24 hours. Feels worse today. He has upper abdominal pain.   Assessment & Plan    Principal Problem:   MDD (major depressive disorder), single episode, severe , no psychosis Active Problems:   HTN (hypertension)   Diabetic gastroparesis   Essential hypertension   Nausea & vomiting   Intractable vomiting with nausea   Intractable cyclical vomiting with nausea    Intractable nausea and vomiting secondary to diabetic gastroparesis. -Patient has had gastric emptying study in July 2015 which was showing delayed gastric emptying and also had EGD in April 2015 which was unremarkable. - Patient feels worse this morning. He vomited multiple times yesterday. RN did corroborate this.  - He is on Reglan, Zofran, Phenergan, erythromycin. Gastroenterology was following, but didn't see him yesterday. We will call them today. - Had multiple discussions with the patient regarding that, and explained that our plan is to try to minimize opioids medication given its potential to worsen his gastroparesis.  Dehydration with  mildly elevated creatinine Possibly due to multiple episodes of emesis yesterday. We will increase the IV fluid rate and repeat blood work tomorrow morning.  Diabetes mellitus type 1 Reasonably well-controlled on current regimen. Hemoglobin A1c 9.2   Essential Hypertension Continue with metoprolol, Norvasc, when necessary hydralazine. Blood pressure noted to be low this morning. Monitor closely.  Hypokalemia Repleted  DVT prophylaxis: Lovenox Code Status: Full Family Communication: Discussed with patient Disposition Plan: Home once stable   Procedures  None  Consults   Gastroenterology   Medications  Scheduled Meds: . amLODipine  10 mg Oral q morning - 10a  . enoxaparin (LOVENOX) injection  60 mg Subcutaneous Q24H  . erythromycin  250 mg Intravenous 4 times per day  . feeding supplement (GLUCERNA SHAKE)  237 mL Oral TID BM  . FLUoxetine  10 mg Oral Daily  . gabapentin  300 mg Oral QHS  . insulin aspart  0-9 Units Subcutaneous TID WC  . insulin glargine  18 Units Subcutaneous Daily  . metoCLOPramide (REGLAN) injection  5 mg Intravenous 4 times per day  . metoprolol  50 mg Oral BID  . ondansetron (ZOFRAN) IV  8 mg Intravenous 4 times per day  . pantoprazole  40 mg Oral BID  . potassium chloride  40 mEq Oral Once  . sodium chloride  10-40 mL Intracatheter Q12H  . sucralfate  1 g Oral TID WC  . traZODone  50 mg Oral QHS   Continuous Infusions: .  sodium chloride     PRN Meds:.acetaminophen **OR** acetaminophen, hydrALAZINE, LORazepam, oxyCODONE, promethazine, sodium chloride  Antibiotics    Anti-infectives    Start     Dose/Rate Route Frequency Ordered Stop   05/17/14 1300  erythromycin 250 mg in sodium chloride 0.9 % 100 mL IVPB     250 mg 100 mL/hr over 60 Minutes Intravenous 4 times per day 05/17/14 1135     05/15/14 1415  erythromycin 250 mg in sodium chloride 0.9 % 100 mL IVPB  Status:  Discontinued     250 mg 100 mL/hr over 60 Minutes Intravenous 4 times  per day 05/15/14 1414 05/16/14 1627        Objective:   Filed Vitals:   05/20/14 0523 05/20/14 1317 05/20/14 2120 05/21/14 0508  BP: 130/83 131/73 149/92 99/53  Pulse: 107 89 106 94  Temp: 97.8 F (36.6 C) 98.8 F (37.1 C) 98.6 F (37 C) 98.4 F (36.9 C)  TempSrc: Oral Oral Oral Oral  Resp: 18 16 16 16   Height:      Weight:      SpO2: 96% 98% 100% 98%    Wt Readings from Last 3 Encounters:  05/11/14 125.193 kg (276 lb)  05/07/14 126.1 kg (278 lb)  05/05/14 121.7 kg (268 lb 4.8 oz)     Intake/Output Summary (Last 24 hours) at 05/21/14 0830 Last data filed at 05/20/14 2207  Gross per 24 hour  Intake    150 ml  Output    300 ml  Net   -150 ml    Physical Exam Awake Alert, in no distress Symmetrical Chest wall movement, Good air movement bilaterally, clear to auscultation.  S1, S2 normal, regular.4. No rubs, murmurs, or bruit. No pedal edema.  Abdomen is soft. Nontender. Nondistended. Bowel sounds are present. No masses or organomegaly.   Data Review  CBC  Recent Labs Lab 05/17/14 0445  WBC 7.7  HGB 13.3  HCT 39.4  PLT 275  MCV 81.2  MCH 27.4  MCHC 33.8  RDW 12.2    Chemistries   Recent Labs Lab 05/15/14 0625 05/18/14 0405 05/19/14 0403 05/21/14 0610  NA 137 138 139 138  K 3.6 3.1* 3.7 3.9  CL 101 104 103 103  CO2 29 27 25 26   GLUCOSE 147* 154* 153* 139*  BUN 11 14 8 10   CREATININE 1.18 1.12 1.07 1.36*  CALCIUM 8.4 8.4 8.6 8.6  AST  --   --  11  --   ALT  --   --  14  --   ALKPHOS  --   --  63  --   BILITOT  --   --  0.8  --      Andretta Ergle M.D on 05/21/2014 at 8:30 AM  Between 7am to 7pm - Pager - [phone]  After 7pm go to [email or web address] - password [email or web address]  [facility]  Office  [phone]

## 2014-05-21 NOTE — Progress Notes (Signed)
    Progress Note   Subjective  vomited early this am. He believes Erythromycin may be worsening his nausea / vomiting.    Objective   Vital signs in last 24 hours: Temp:  [98.4 F (36.9 C)-98.6 F (37 C)] 98.4 F (36.9 C) (04/08 0508) Pulse Rate:  [94-106] 94 (04/08 0508) Resp:  [16] 16 (04/08 0508) BP: (99-149)/(53-92) 99/53 mmHg (04/08 0508) SpO2:  [98 %-100 %] 98 % (04/08 0508) Last BM Date: 05/17/14 General:    Black male in NAD Heart:  Regular rate and rhythm Lungs: Respirations even and unlabored, lungs CTA bilaterally Abdomen:  Soft, nontender and nondistended. Normal bowel sounds. Extremities:  Without edema. Neurologic:  Alert and oriented,  grossly normal neurologically. Psych:  Cooperative. Normal mood and affect.  Intake/Output from previous day: 04/07 0701 - 04/08 0700 In: 150 [IV Piggyback:150] Out: 300 [Emesis/NG output:300] Intake/Output this shift: Total I/O In: -  Out: 250 [Emesis/NG output:250]  BMET  Recent Labs  05/19/14 0403 05/21/14 0610  NA 139 138  K 3.7 3.9  CL 103 103  CO2 25 26  GLUCOSE 153* 139*  BUN 8 10  CREATININE 1.07 1.36*  CALCIUM 8.6 8.6   LFT  Recent Labs  05/19/14 0403  PROT 6.8  ALBUMIN 3.6  AST 11  ALT 14  ALKPHOS 63  BILITOT 0.8     Assessment / Plan:   39 year old male with multiple admissions for nausea and vomiting. He has known diabetic gastroparesis. 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 He is not improving on aggressive therapy.   Our office called [facility] and got patient an appt for late June (see our phone note). I stressed importance of keeping that appointment.    Patient thinks IV erythromycin may be exacerbating nausea / vomiting so will discontinue it.   Increased IV Reglan from 5mg  to 10mg , Continue BID PPI, Zofran 8mg  IV QID,and Carafate TID.   He needs to stop narcotics which he is taking for abdominal pain. The pain is associated  with vomiting. I recommended NGT to [facility]. Patient states previous attempts at NGT placement have been unsuccessful.   Low dose ativan helps refractory nausea and vomiting in some patients. Patient agrees to try the Ativan and hold off on Narcotics unless absolutely necessary.   Blood sugars are good, continue to monitor.   If above measures don't help then will need reattempt at NGT placement. Attending MD note:   I have taken a history, examined the patient, and reviewed the chart. I agree with the Advanced Practitioner's impression and recommendations. No further recommendations. Obviously, he does not want NGT, but, typically patients with true vomiting from gastroparesis respond to 48 hour NG decompression. Increase Reglan but watch for CNS sideffects  [facility] Pager # [phone]     LOS: 10 days   Tye Savoy  05/21/2014, 1:21 PM

## 2014-05-21 NOTE — Telephone Encounter (Signed)
Per Tye Savoy, NP need to be scheduled at [facility] hospital clinic with gastroparesis specialist. Called and scheduled  With Dr. Rosaria Ferries on June 29 at 3:30 PM. Patient no showed in Oct. Needs to take medication list, photo ID. Arrive 15 minutes early. 7 th floor [employer]. Faxed records to [phone]. Tye Savoy, NP aware of appointment.

## 2014-05-21 NOTE — Progress Notes (Signed)
CSW met with Pt along with psych MD. Pt lethargic and reported not feeling well as he spent most of the night vomiting. Psych MD reported that he would not adjust medications further until Pt's stomach issues had improved. Pt agreeable.   CSW will continue to follow Pt; list of resources provided for Pt to seek out outpatient therapy. Psych MD will continue to monitor medications and make adjustments as necessary.  Peri Maris, LCSWA 05/21/2014 2:52 PM [phone]

## 2014-05-22 DIAGNOSIS — E118 Type 2 diabetes mellitus with unspecified complications: Secondary | ICD-10-CM

## 2014-05-22 LAB — GLUCOSE, CAPILLARY
GLUCOSE-CAPILLARY: 98 mg/dL (ref 70–99)
GLUCOSE-CAPILLARY: 252 mg/dL — AB (ref 70–99)
GLUCOSE-CAPILLARY: 209 mg/dL — AB (ref 70–99)
Glucose-Capillary: 63 mg/dL — ABNORMAL LOW (ref 70–99)
Glucose-Capillary: 192 mg/dL — ABNORMAL HIGH (ref 70–99)

## 2014-05-22 LAB — BASIC METABOLIC PANEL
Anion gap: 7 (ref 5–15)
BUN: 12 mg/dL (ref 6–23)
CO2: 26 mmol/L (ref 19–32)
Calcium: 8.2 mg/dL — ABNORMAL LOW (ref 8.4–10.5)
Chloride: 102 mmol/L (ref 96–112)
Creatinine, Ser: 1.39 mg/dL — ABNORMAL HIGH (ref 0.50–1.35)
GFR calc Af Amer: 74 mL/min — ABNORMAL LOW (ref >90)
GFR, EST NON AFRICAN AMERICAN: 64 mL/min — AB (ref >90)
GLUCOSE: 102 mg/dL — AB (ref 70–99)
POTASSIUM: 3.2 mmol/L — AB (ref 3.5–5.1)
Sodium: 135 mmol/L (ref 135–145)

## 2014-05-22 MED ORDER — SODIUM CHLORIDE 0.9 % IV BOLUS (SEPSIS)
500.0000 mL | Freq: Once | INTRAVENOUS | Status: AC
  Administered 2014-05-22: 500 mL via INTRAVENOUS

## 2014-05-22 MED ORDER — POTASSIUM CHLORIDE 10 MEQ/100ML IV SOLN
10.0000 meq | INTRAVENOUS | Status: AC
  Administered 2014-05-22 (×4): 10 meq via INTRAVENOUS
  Filled 2014-05-22 (×4): qty 100

## 2014-05-22 NOTE — Progress Notes (Signed)
Patient Demographics  Duane [name], is a 39 y.o. male, DOB - [birth year], [id number]  Outpatient Primary MD for the patient is NNODI, ADAKU, MD  LOS - 11 Days   Chief Complaint  Patient presents with  . Vomiting       Brief narrative: Duane [name] is a 39 y.o. male with history of diabetes mellitus type 1 who has had recent multiple admissions for gastroparesis related nausea vomiting. He presented with recurrent complaints of nausea and vomiting. He was admitted for further management. Patient has had gastric emptying study in July 2015 which was showing delayed gastric emptying. EGD done in April 2015 was unremarkable. Hospital course complicated by recurrent nausea and vomiting.  Subjective:   Patient feels better. No further nausea, vomiting since yesterday morning. Pain is improved. Requesting advancement in diet.   Assessment & Plan    Principal Problem:   MDD (major depressive disorder), single episode, severe , no psychosis Active Problems:   HTN (hypertension)   Diabetic gastroparesis   Essential hypertension   Nausea & vomiting   Intractable vomiting with nausea   Intractable cyclical vomiting with nausea    Intractable nausea and vomiting secondary to diabetic gastroparesis. -Patient has had gastric emptying study in July 2015 which was showing delayed gastric emptying and also had EGD in April 2015 which was unremarkable. - He was on Reglan, Zofran, Phenergan, erythromycin.  - Gastroenterology is following. Erythromycin was discontinued. Ativan was added. - Told patient that the narcotics need to be avoided as much as possible given its potential to worsen his gastroparesis.  Dehydration with mildly elevated creatinine Possibly due to multiple episodes of emesis. Continue IV fluids. Repeat labs in the morning.   Diabetes mellitus type  1 Reasonably well-controlled on current regimen. Hemoglobin A1c 9.2   Essential Hypertension Continue with metoprolol, Norvasc, when necessary hydralazine. Blood pressure stable Monitor closely.  Hypokalemia We will replete intravenously.  DVT prophylaxis: Lovenox Code Status: Full Family Communication: Discussed with patient Disposition Plan: Home once stable. Mobilize.   Procedures  None  Consults   Gastroenterology   Medications  Scheduled Meds: . amLODipine  10 mg Oral q morning - 10a  . enoxaparin (LOVENOX) injection  60 mg Subcutaneous Q24H  . feeding supplement (GLUCERNA SHAKE)  237 mL Oral TID BM  . FLUoxetine  10 mg Oral Daily  . gabapentin  300 mg Oral QHS  . insulin aspart  0-9 Units Subcutaneous TID WC  . insulin glargine  18 Units Subcutaneous Daily  . LORazepam  0.5 mg Oral BID  . metoCLOPramide (REGLAN) injection  10 mg Intravenous 4 times per day  . metoprolol  50 mg Oral BID  . ondansetron (ZOFRAN) IV  8 mg Intravenous 4 times per day  . pantoprazole  40 mg Oral BID  . potassium chloride  10 mEq Intravenous Q1 Hr x 4  . potassium chloride  40 mEq Oral Once  . sodium chloride  500 mL Intravenous Once  . sodium chloride  10-40 mL Intracatheter Q12H  . sucralfate  1 g Oral TID WC  . traZODone  50 mg Oral QHS   Continuous Infusions: . sodium chloride 75 mL/hr at 05/21/14 2212   PRN Meds:.acetaminophen **OR** acetaminophen, hydrALAZINE, LORazepam, oxyCODONE,  promethazine, sodium chloride  Antibiotics    Anti-infectives    Start     Dose/Rate Route Frequency Ordered Stop   05/17/14 1300  erythromycin 250 mg in sodium chloride 0.9 % 100 mL IVPB  Status:  Discontinued     250 mg 100 mL/hr over 60 Minutes Intravenous 4 times per day 05/17/14 1135 05/21/14 1241   05/15/14 1415  erythromycin 250 mg in sodium chloride 0.9 % 100 mL IVPB  Status:  Discontinued     250 mg 100 mL/hr over 60 Minutes Intravenous 4 times per day 05/15/14 1414 05/16/14 1627         Objective:   Filed Vitals:   05/21/14 0508 05/21/14 1358 05/21/14 2115 05/22/14 0441  BP: 99/53 141/86 131/80 125/68  Pulse: 94 97 90 73  Temp: 98.4 F (36.9 C) 98.1 F (36.7 C) 97.9 F (36.6 C) 98.5 F (36.9 C)  TempSrc: Oral Oral Oral Oral  Resp: 16 20 20 20   Height:      Weight:      SpO2: 98% 99% 96% 99%    Wt Readings from Last 3 Encounters:  05/11/14 125.193 kg (276 lb)  05/07/14 126.1 kg (278 lb)  05/05/14 121.7 kg (268 lb 4.8 oz)     Intake/Output Summary (Last 24 hours) at 05/22/14 0812 Last data filed at 05/22/14 0511  Gross per 24 hour  Intake 2624.4 ml  Output      0 ml  Net 2624.4 ml    Physical Exam Awake Alert, in no distress Symmetrical Chest wall movement, Good air movement bilaterally, clear to auscultation.  S1, S2 normal, regular.4. No rubs, murmurs, or bruit. No pedal edema.  Abdomen is soft. Nontender. Nondistended. Bowel sounds are present. No masses or organomegaly.   Data Review  CBC  Recent Labs Lab 05/17/14 0445  WBC 7.7  HGB 13.3  HCT 39.4  PLT 275  MCV 81.2  MCH 27.4  MCHC 33.8  RDW 12.2    Chemistries   Recent Labs Lab 05/18/14 0405 05/19/14 0403 05/21/14 0610 05/22/14 0505  NA 138 139 138 135  K 3.1* 3.7 3.9 3.2*  CL 104 103 103 102  CO2 27 25 26 26   GLUCOSE 154* 153* 139* 102*  BUN 14 8 10 12   CREATININE 1.12 1.07 1.36* 1.39*  CALCIUM 8.4 8.6 8.6 8.2*  AST  --  11  --   --   ALT  --  14  --   --   ALKPHOS  --  63  --   --   BILITOT  --  0.8  --   Bonnielee Haff M.D on 05/22/2014 at 8:12 AM  Between 7am to 7pm - Pager - [phone]  After 7pm go to [email or web address] - password [facility]  [facility]  Office  [phone]

## 2014-05-23 DIAGNOSIS — E1169 Type 2 diabetes mellitus with other specified complication: Secondary | ICD-10-CM

## 2014-05-23 LAB — GLUCOSE, CAPILLARY
GLUCOSE-CAPILLARY: 103 mg/dL — AB (ref 70–99)
Glucose-Capillary: 161 mg/dL — ABNORMAL HIGH (ref 70–99)
Glucose-Capillary: 139 mg/dL — ABNORMAL HIGH (ref 70–99)
Glucose-Capillary: 152 mg/dL — ABNORMAL HIGH (ref 70–99)

## 2014-05-23 LAB — BASIC METABOLIC PANEL
ANION GAP: 7 (ref 5–15)
BUN: 9 mg/dL (ref 6–23)
CALCIUM: 8.1 mg/dL — AB (ref 8.4–10.5)
CHLORIDE: 105 mmol/L (ref 96–112)
CO2: 26 mmol/L (ref 19–32)
CREATININE: 1.35 mg/dL (ref 0.50–1.35)
GFR, EST AFRICAN AMERICAN: 76 mL/min — AB (ref >90)
GFR, EST NON AFRICAN AMERICAN: 66 mL/min — AB (ref >90)
Glucose, Bld: 135 mg/dL — ABNORMAL HIGH (ref 70–99)
POTASSIUM: 3.3 mmol/L — AB (ref 3.5–5.1)
SODIUM: 138 mmol/L (ref 135–145)

## 2014-05-23 MED ORDER — METOCLOPRAMIDE HCL 10 MG PO TABS
10.0000 mg | ORAL_TABLET | Freq: Three times a day (TID) | ORAL | Status: DC
  Administered 2014-05-23 – 2014-05-24 (×5): 10 mg via ORAL
  Filled 2014-05-23 (×7): qty 1

## 2014-05-23 MED ORDER — POTASSIUM CHLORIDE 10 MEQ/100ML IV SOLN
10.0000 meq | INTRAVENOUS | Status: AC
  Administered 2014-05-23 (×4): 10 meq via INTRAVENOUS
  Filled 2014-05-23 (×4): qty 100

## 2014-05-23 NOTE — Progress Notes (Addendum)
Patient Demographics  Duane [name], is a 39 y.o. male, DOB - December 05, 1975, [id number]  Outpatient Primary MD for the patient is NNODI, ADAKU, MD  LOS - 12 Days   Chief Complaint  Patient presents with  . Vomiting       Brief narrative: Duane [name] is a 39 y.o. male with history of diabetes mellitus type 1 who has had recent multiple admissions for gastroparesis related nausea vomiting. He presented with recurrent complaints of nausea and vomiting. He was admitted for further management. Patient has had gastric emptying study in July 2015 which was showing delayed gastric emptying. EGD done in April 2015 was unremarkable. Hospital course complicated by recurrent nausea and vomiting.   Subjective:   Patient continues to feel better. He was advanced to soft diet. Last night, which he tolerated. He hasn't had any vomiting in the last 48 hours.   Assessment & Plan    Principal Problem:   MDD (major depressive disorder), single episode, severe , no psychosis Active Problems:   HTN (hypertension)   Diabetic gastroparesis   Essential hypertension   Nausea & vomiting   Intractable vomiting with nausea   Intractable cyclical vomiting with nausea    Intractable nausea and vomiting secondary to diabetic gastroparesis. -Patient has had gastric emptying study in July 2015 which was showing delayed gastric emptying and also had EGD in April 2015 which was unremarkable. - Gastroenterology is following. Erythromycin was discontinued. Ativan was added. Continue Reglan. Change to oral today. - Told patient that the narcotics need to be avoided as much as possible given its potential to worsen his gastroparesis. - Seems to be improving. Has tolerated solid diet last night. Continue to monitor for another 24 hours.  Dehydration with mildly elevated creatinine Improved.  Possibly due to multiple episodes of emesis. Continue IV fluids.   Diabetes mellitus type 1 Reasonably well-controlled on current regimen. Hemoglobin A1c 9.2   Essential Hypertension Continue with metoprolol, Norvasc, when necessary hydralazine. Blood pressure stable Monitor closely.  Hypokalemia We will replete intravenously.  Major depression Seen by psychiatry. Started on fluoxetine, which will be continued.  DVT prophylaxis: Lovenox Code Status: Full Family Communication: Discussed with patient Disposition Plan: Home once stable. Mobilize.   Procedures  None  Consults   Gastroenterology   Medications  Scheduled Meds: . amLODipine  10 mg Oral q morning - 10a  . enoxaparin (LOVENOX) injection  60 mg Subcutaneous Q24H  . feeding supplement (GLUCERNA SHAKE)  237 mL Oral TID BM  . FLUoxetine  10 mg Oral Daily  . gabapentin  300 mg Oral QHS  . insulin aspart  0-9 Units Subcutaneous TID WC  . insulin glargine  18 Units Subcutaneous Daily  . LORazepam  0.5 mg Oral BID  . metoCLOPramide  10 mg Oral TID AC & HS  . metoprolol  50 mg Oral BID  . ondansetron (ZOFRAN) IV  8 mg Intravenous 4 times per day  . pantoprazole  40 mg Oral BID  . potassium chloride  10 mEq Intravenous Q1 Hr x 4  . potassium chloride  40 mEq Oral Once  . sodium chloride  10-40 mL Intracatheter Q12H  . sucralfate  1 g Oral TID WC  . traZODone  50 mg  Oral QHS   Continuous Infusions:   PRN Meds:.acetaminophen **OR** acetaminophen, hydrALAZINE, LORazepam, oxyCODONE, promethazine, sodium chloride  Antibiotics    Anti-infectives    Start     Dose/Rate Route Frequency Ordered Stop   05/17/14 1300  erythromycin 250 mg in sodium chloride 0.9 % 100 mL IVPB  Status:  Discontinued     250 mg 100 mL/hr over 60 Minutes Intravenous 4 times per day 05/17/14 1135 05/21/14 1241   05/15/14 1415  erythromycin 250 mg in sodium chloride 0.9 % 100 mL IVPB  Status:  Discontinued     250 mg 100 mL/hr over 60 Minutes  Intravenous 4 times per day 05/15/14 1414 05/16/14 1627        Objective:   Filed Vitals:   05/22/14 0441 05/22/14 1402 05/22/14 2005 05/23/14 0438  BP: 125/68 112/62 120/69 119/69  Pulse: 73 72 77 70  Temp: 98.5 F (36.9 C) 97.9 F (36.6 C) 98.2 F (36.8 C) 98.1 F (36.7 C)  TempSrc: Oral Oral Oral Oral  Resp: 20 20 16 16   Height:      Weight:      SpO2: 99% 100% 99% 100%    Wt Readings from Last 3 Encounters:  05/11/14 125.193 kg (276 lb)  05/07/14 126.1 kg (278 lb)  05/05/14 121.7 kg (268 lb 4.8 oz)     Intake/Output Summary (Last 24 hours) at 05/23/14 0806 Last data filed at 05/22/14 2130  Gross per 24 hour  Intake    639 ml  Output      0 ml  Net    639 ml    Physical Exam Awake Alert, in no distress Good air movement bilaterally, clear to auscultation.  S1, S2 normal, regular. No rubs, murmurs, or bruit. No pedal edema.  Abdomen is soft. Mildly tender in the upper abdomen without any rebound, rigidity or guarding.. Nondistended. Bowel sounds are present. No masses or organomegaly.   Data Review  CBC  Recent Labs Lab 05/17/14 0445  WBC 7.7  HGB 13.3  HCT 39.4  PLT 275  MCV 81.2  MCH 27.4  MCHC 33.8  RDW 12.2    Chemistries   Recent Labs Lab 05/18/14 0405 05/19/14 0403 05/21/14 0610 05/22/14 0505 05/23/14 0612  NA 138 139 138 135 138  K 3.1* 3.7 3.9 3.2* 3.3*  CL 104 103 103 102 105  CO2 27 25 26 26 26   GLUCOSE 154* 153* 139* 102* 135*  BUN 14 8 10 12 9   CREATININE 1.12 1.07 1.36* 1.39* 1.35  CALCIUM 8.4 8.6 8.6 8.2* 8.1*  AST  --  11  --   --   --   ALT  --  14  --   --   --   ALKPHOS  --  63  --   --   --   BILITOT  --  0.8  --   --   Bonnielee Haff M.D on 05/23/2014 at 8:06 AM  Between 7am to 7pm - Pager - [phone]  After 7pm go to [email or web address] - password [facility]  [facility]  Office  [phone]

## 2014-05-23 NOTE — Clinical Social Work Psychosocial (Signed)
Clinical Social Work Department BRIEF PSYCHOSOCIAL ASSESSMENT 05/23/2014  Patient:  Duane [name], Duane [name]     Account Number:  [id number]     Admit date:  05/10/2014  Clinical Social Worker:  Dede Query, CLINICAL SOCIAL WORKER  Date/Time:  05/22/2014 11:00 AM  Referred by:  Physician  Date Referred:  05/28/2014 Referred for  Behavioral Health Issues   Other Referral:   Depression   Interview type:  Patient Other interview type:    PSYCHOSOCIAL DATA Living Status:  PARENTS Admitted from facility:   Level of care:   Primary support name:  Alisa Primary support relationship to patient:  PARENT Degree of support available:   [place] pt lives with his mother and step father and per pt they are supportive    CURRENT CONCERNS  Other Concerns:    SOCIAL WORK ASSESSMENT / PLAN CSW reviewed pt chart that reflected pt was not at any self harm risks.  CSW met with pt at bedside to complete psychosocial assessment.  CSW introduced herself and explained role. CSW encouraged pt to discuss his history, current needs and services that he receives in his community.  CSW prompted pt to explore his thoughts and feelings related to his health, home life and life goals.  CSW provided active and supportive listening and provided pt with options in his community that are free of cost and are related to his health and wellness.   Assessment/plan status:  No Further Intervention Required Other assessment/ plan:   Information/referral to community resources:   [facility] is the county mental health facility that treats pts in the areas of psychotropic medications and counseling who do not have insurance    PATIENT'S/FAMILY'S RESPONSE TO PLAN OF CARE: Pt discussed a history of depression that "comes and goes". Pt has no history of counseling or psychotropic medications and does not currently see a psychiatrist or counselor in his community.  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  Pt stated that he is currently living with his parents and that they are of support to him.  Pt was "ok" with CSW following up with his mother for any additional information. Pt has never been married but has 3 children, all boys living in different states, ages 39, 47 and 56.  Pt is from [place] and is only in this area less than a year stating he was not familiar with the services offered in his community.  Pt currently has no health insurance and is working towards getting [employer].  pt stated that he never felt the need to seek any mental heath counseling or psychiatric care and that his family doctor could provide him with what ever medications he needs.  Pt denies any history of psychotropic medications but stated that he has began taking these since his hospital stay.  Pt stated that he thought that he was "facing his depression now" and understands the reasons he feels depressed have to do with his "health and quality of life".  Pt appreciate of the information regarding free counseling at the county mental health center in [facility] and stated he would follow up with these services once released from the hospital.   .Dede Query, [place] Worker - Weekend Coverage cell #: [phone]

## 2014-05-24 LAB — GLUCOSE, CAPILLARY
Glucose-Capillary: 133 mg/dL — ABNORMAL HIGH (ref 70–99)
Glucose-Capillary: 156 mg/dL — ABNORMAL HIGH (ref 70–99)

## 2014-05-24 LAB — BASIC METABOLIC PANEL
ANION GAP: 5 (ref 5–15)
BUN: 7 mg/dL (ref 6–23)
CO2: 27 mmol/L (ref 19–32)
Calcium: 8.4 mg/dL (ref 8.4–10.5)
Chloride: 107 mmol/L (ref 96–112)
Creatinine, Ser: 1.27 mg/dL (ref 0.50–1.35)
GFR calc Af Amer: 82 mL/min — ABNORMAL LOW (ref >90)
GFR, EST NON AFRICAN AMERICAN: 71 mL/min — AB (ref >90)
GLUCOSE: 165 mg/dL — AB (ref 70–99)
Potassium: 3.4 mmol/L — ABNORMAL LOW (ref 3.5–5.1)
SODIUM: 139 mmol/L (ref 135–145)

## 2014-05-24 LAB — CBC
HEMATOCRIT: 35.1 % — AB (ref 39.0–52.0)
Hemoglobin: 11.4 g/dL — ABNORMAL LOW (ref 13.0–17.0)
MCH: 26.7 pg (ref 26.0–34.0)
MCHC: 32.5 g/dL (ref 30.0–36.0)
MCV: 82.2 fL (ref 78.0–100.0)
Platelets: 263 10*3/uL (ref 150–400)
RBC: 4.27 MIL/uL (ref 4.22–5.81)
RDW: 12.5 % (ref 11.5–15.5)
WBC: 5.3 10*3/uL (ref 4.0–10.5)

## 2014-05-24 MED ORDER — ONDANSETRON 4 MG PO TBDP: 4.0000 mg | ORAL_TABLET | Freq: Three times a day (TID) | ORAL | Status: DC | PRN

## 2014-05-24 MED ORDER — GLUCERNA SHAKE PO LIQD: 237.0000 mL | Freq: Three times a day (TID) | ORAL | Status: DC

## 2014-05-24 MED ORDER — METOCLOPRAMIDE HCL 10 MG PO TABS: 10.0000 mg | ORAL_TABLET | Freq: Three times a day (TID) | ORAL | Status: DC

## 2014-05-24 MED ORDER — TRAZODONE HCL 50 MG PO TABS: 50.0000 mg | ORAL_TABLET | Freq: Every day | ORAL | Status: DC

## 2014-05-24 MED ORDER — POTASSIUM CHLORIDE CRYS ER 20 MEQ PO TBCR
40.0000 meq | EXTENDED_RELEASE_TABLET | Freq: Once | ORAL | Status: AC
  Administered 2014-05-24: 40 meq via ORAL
  Filled 2014-05-24: qty 2

## 2014-05-24 MED ORDER — FLUOXETINE HCL 10 MG PO CAPS: 10.0000 mg | ORAL_CAPSULE | Freq: Every day | ORAL | Status: DC

## 2014-05-24 MED ORDER — LORAZEPAM 0.5 MG PO TABS: 0.5000 mg | ORAL_TABLET | Freq: Two times a day (BID) | ORAL | Status: DC | PRN

## 2014-05-24 NOTE — Consult Note (Signed)
Psychiatry Consult progress note  Reason for Consult:  Depression with disturbance of sleep and appetite and chronic medical condition with acute exacerbations. Referring Physician:  Dr. Gerrie Nordmann Patient Identification: Duane [name] MRN:  [id number] Principal Diagnosis: MDD (major depressive disorder), single episode, severe , no psychosis Diagnosis:   Patient Active Problem List   Diagnosis Date Noted  . MDD (major depressive disorder), single episode, severe , no psychosis [F32.2] 05/19/2014  . Intractable cyclical vomiting with nausea [G43.A1]   . Intractable vomiting with nausea [R11.10] 05/11/2014  . GERD (gastroesophageal reflux disease) [K21.9] 05/05/2014  . Noncompliance [Z91.19] 04/30/2014  . Nausea with vomiting [R11.2]   . Nausea & vomiting [R11.2] 02/03/2014  . Type I diabetes mellitus with complication, uncontrolled [E10.69]   . Essential hypertension [I10]   . Epigastric pain [R10.13] 12/31/2013  . Gastroparesis [K31.84] 10/14/2013  . Acute kidney injury [N17.9] 10/14/2013  . Hyperglycemia due to type 1 diabetes mellitus [E10.65] 10/14/2013  . Constipation [K59.00] 09/23/2013  . Diabetic neuropathy [E11.40] 08/28/2013  . Hypoglycemia [E16.2] 08/28/2013  . Diabetic gastroparesis [E11.43] 08/27/2013  . Hypokalemia [E87.6] 06/04/2013  . Intractable nausea and vomiting [R11.10] 05/30/2013  . IDDM (insulin dependent diabetes mellitus) [E11.9, Z79.4] 01/09/2013  . HTN (hypertension) [I10] 01/09/2013    Total Time spent with patient: 30 minutes  Subjective:   Duane [name] is a 39 y.o. male patient admitted with depression.  HPI:  Duane [name] is a 40 y.o. male seen, chart reviewed and case discussed with the psychiatric social service. Patient reported feeling depressed, sad, loss of interest and enthusiasm, poor energy, disturbed sleep and feeling helpless and worthless. 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  Patient has been staying with his mother and stepfather. Patient never married and has 3 children and has failed contact with them. Reportedly difficult to maintain relationship because of distance. Patient reported he was originally from [place] but stayed in [place] for last 3-1/2 years and came to mother and stepfather about a year ago. Patient reported he lost his health and lot of support system like friends since she has been isolated and withdrawn. Patient denies current suicidal, homicidal ideation, intention or plan. Patient has no evidence of psychosis. Patient has no previous history of mental health treatment either inpatient or outpatient. Patient contract for safety while in the hospital. Patient is willing to provide take medication for depression and insomnia.   05/24/2014  Interval history: Patient has no complains today and stated he has been doing well this weekend without  nausea and vomiting. Patient has been tolerating his medication and diet. He asked to provide out side resources like counseling and medication management for depression because he has been new to this city and has limited support system. Patient denies suicidal/homicidal ideation, intention or plans. Patient mother has been supportive to him. He contract for safety and social service will provide out patient psychiatric medication and counseling resources. Patient seems to be satisfied with his current treatment and has concerns about multiple medical admission for nausea and vomiting in the last 3-4 years.     Past Medical History:  Past Medical History  Diagnosis Date  . Hypertension   . Diabetes mellitus without complication age 66    insulin requiring from the start  . Gastritis   . Gastroparesis 2012    100% gastric retention on 08/2013 [facility]  . Obesity   . Diabetic neuropathy   . GERD (gastroesophageal reflux disease)  Past Surgical History  Procedure Laterality Date  . Cholecystectomy  2013   . Knee surgery Left   . Flexible sigmoidoscopy N/A 06/11/2013    Procedure: FLEXIBLE SIGMOIDOSCOPY;  Surgeon: [name], MD;  Location: [facility];  Service: Endoscopy;  Laterality: N/A;  . Esophagogastroduodenoscopy N/A 06/11/2013    Procedure: ESOPHAGOGASTRODUODENOSCOPY (EGD);  Surgeon: [name], MD;  Location: [facility];  Service: Endoscopy;  Laterality: N/A;   Family History:  Family History  Problem Relation Age of Onset  . Diabetes Father    Social History:  History  Alcohol Use  . Yes    Comment: "none really" - 08/27/13     History  Drug Use No    History   Social History  . Marital Status: Single    Spouse Name: N/A  . Number of Children: N/A  . Years of Education: N/A   Occupational History  . 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    Social History Main Topics  . Smoking status: Never Smoker   . Smokeless tobacco: Never Used  . Alcohol Use: Yes     Comment: "none really" - 08/27/13  . Drug Use: No  . Sexual Activity: Yes   Other Topics Concern  . None   Social History Narrative   Additional Social History:                          Allergies:  No Known Allergies  Labs:  Results for orders placed or performed during the hospital encounter of 05/10/14 (from the past 48 hour(s))  Glucose, capillary     Status: Abnormal   Collection Time: 05/22/14  4:36 PM  Result Value Ref Range   Glucose-Capillary 63 (L) 70 - 99 mg/dL  Glucose, capillary     Status: Abnormal   Collection Time: 05/22/14  5:54 PM  Result Value Ref Range   Glucose-Capillary 209 (H) 70 - 99 mg/dL  Glucose, capillary     Status: Abnormal   Collection Time: 05/22/14  9:36 PM  Result Value Ref Range   Glucose-Capillary 192 (H) 70 - 99 mg/dL   Comment 1 Notify RN   Basic metabolic panel     Status: Abnormal   Collection Time: 05/23/14  6:12 AM  Result Value Ref Range   Sodium 138 135 - 145 mmol/L   Potassium 3.3 (L) 3.5 - 5.1 mmol/L    Chloride 105 96 - 112 mmol/L   CO2 26 19 - 32 mmol/L   Glucose, Bld 135 (H) 70 - 99 mg/dL   BUN 9 6 - 23 mg/dL   Creatinine, Ser 1.35 0.50 - 1.35 mg/dL   Calcium 8.1 (L) 8.4 - 10.5 mg/dL   GFR calc non Af Amer 66 (L) >90 mL/min   GFR calc Af Amer 76 (L) >90 mL/min    Comment: (NOTE) The eGFR has been calculated using the CKD EPI equation. This calculation has not been validated in all clinical situations. eGFR's persistently <90 mL/min signify possible Chronic Kidney Disease.    Anion gap 7 5 - 15  Glucose, capillary     Status: Abnormal   Collection Time: 05/23/14  7:59 AM  Result Value Ref Range   Glucose-Capillary 103 (H) 70 - 99 mg/dL  Glucose, capillary     Status: Abnormal   Collection Time: 05/23/14 11:18 AM  Result Value Ref Range   Glucose-Capillary 161 (H)  70 - 99 mg/dL  Glucose, capillary     Status: Abnormal   Collection Time: 05/23/14  5:10 PM  Result Value Ref Range   Glucose-Capillary 139 (H) 70 - 99 mg/dL  Glucose, capillary     Status: Abnormal   Collection Time: 05/23/14  9:22 PM  Result Value Ref Range   Glucose-Capillary 152 (H) 70 - 99 mg/dL   Comment 1 Notify RN   Basic metabolic panel     Status: Abnormal   Collection Time: 05/24/14  5:16 AM  Result Value Ref Range   Sodium 139 135 - 145 mmol/L   Potassium 3.4 (L) 3.5 - 5.1 mmol/L   Chloride 107 96 - 112 mmol/L   CO2 27 19 - 32 mmol/L   Glucose, Bld 165 (H) 70 - 99 mg/dL   BUN 7 6 - 23 mg/dL   Creatinine, Ser 1.27 0.50 - 1.35 mg/dL   Calcium 8.4 8.4 - 10.5 mg/dL   GFR calc non Af Amer 71 (L) >90 mL/min   GFR calc Af Amer 82 (L) >90 mL/min    Comment: (NOTE) The eGFR has been calculated using the CKD EPI equation. This calculation has not been validated in all clinical situations. eGFR's persistently <90 mL/min signify possible Chronic Kidney Disease.    Anion gap 5 5 - 15  CBC     Status: Abnormal   Collection Time: 05/24/14  5:16 AM  Result Value Ref Range   WBC 5.3 4.0 - 10.5 K/uL    RBC 4.27 4.22 - 5.81 MIL/uL   Hemoglobin 11.4 (L) 13.0 - 17.0 g/dL   HCT 35.1 (L) 39.0 - 52.0 %   MCV 82.2 78.0 - 100.0 fL   MCH 26.7 26.0 - 34.0 pg   MCHC 32.5 30.0 - 36.0 g/dL   RDW 12.5 11.5 - 15.5 %   Platelets 263 150 - 400 K/uL  Glucose, capillary     Status: Abnormal   Collection Time: 05/24/14  7:46 AM  Result Value Ref Range   Glucose-Capillary 133 (H) 70 - 99 mg/dL  Glucose, capillary     Status: Abnormal   Collection Time: 05/24/14 11:30 AM  Result Value Ref Range   Glucose-Capillary 156 (H) 70 - 99 mg/dL    Vitals: Blood pressure 128/77, pulse 71, temperature 97.6 F (36.4 C), temperature source Oral, resp. rate 16, height 6' 3"  (1.905 m), weight 125.193 kg (276 lb), SpO2 99 %.  Risk to Self: Is patient at risk for suicide?: No Risk to Others:   Prior Inpatient Therapy:   Prior Outpatient Therapy:    Current Facility-Administered Medications  Medication Dose Route Frequency Provider Last Rate Last Dose  . acetaminophen (TYLENOL) tablet 650 mg  650 mg Oral Q6H PRN [name], MD   650 mg at 05/11/14 1723   Or  . acetaminophen (TYLENOL) suppository 650 mg  650 mg Rectal Q6H PRN [name], MD      . amLODipine (NORVASC) tablet 10 mg  10 mg Oral q morning - 10a [name], MD   10 mg at 05/24/14 1008  . enoxaparin (LOVENOX) injection 60 mg  60 mg Subcutaneous Q24H Anh P Pham, RPH   60 mg at 05/23/14 1746  . feeding supplement (GLUCERNA SHAKE) (GLUCERNA SHAKE) liquid 237 mL  237 mL Oral TID BM Albertine Patricia, MD   237 mL at 05/21/14 1551  . FLUoxetine (PROZAC) capsule 10 mg  10 mg Oral Daily Ambrose Finland, MD  10 mg at 05/24/14 1008  . gabapentin (NEURONTIN) capsule 300 mg  300 mg Oral QHS [name], MD   300 mg at 05/23/14 2233  . hydrALAZINE (APRESOLINE) injection 10 mg  10 mg Intravenous Q4H PRN [name], MD   10 mg at 05/14/14 0702  . insulin aspart (novoLOG) injection 0-9 Units  0-9 Units Subcutaneous TID  WC [name], MD   2 Units at 05/24/14 1219  . insulin glargine (LANTUS) injection 18 Units  18 Units Subcutaneous Daily Albertine Patricia, MD   18 Units at 05/24/14 1009  . LORazepam (ATIVAN) injection 0.5 mg  0.5 mg Intravenous Q6H PRN Bonnielee Haff, MD      . LORazepam (ATIVAN) tablet 0.5 mg  0.5 mg Oral BID Willia Craze, NP   0.5 mg at 05/24/14 1008  . metoCLOPramide (REGLAN) tablet 10 mg  10 mg Oral TID AC & HS Bonnielee Haff, MD   10 mg at 05/24/14 0854  . metoprolol (LOPRESSOR) tablet 50 mg  50 mg Oral BID [name], MD   50 mg at 05/24/14 1008  . oxyCODONE (Oxy IR/ROXICODONE) immediate release tablet 5 mg  5 mg Oral Q4H PRN Albertine Patricia, MD   5 mg at 05/23/14 2234  . pantoprazole (PROTONIX) EC tablet 40 mg  40 mg Oral BID [name], MD   40 mg at 05/24/14 1008  . potassium chloride 20 MEQ/15ML (10%) solution 40 mEq  40 mEq Oral Once Albertine Patricia, MD   40 mEq at 05/18/14 1300  . promethazine (PHENERGAN) injection 25 mg  25 mg Intravenous Q6H PRN Albertine Patricia, MD   25 mg at 05/23/14 2235  . sodium chloride 0.9 % injection 10-40 mL  10-40 mL Intracatheter Q12H Bonnielee Haff, MD   10 mL at 05/24/14 1010  . sodium chloride 0.9 % injection 10-40 mL  10-40 mL Intracatheter PRN Bonnielee Haff, MD   10 mL at 05/21/14 1315  . sucralfate (CARAFATE) 1 GM/10ML suspension 1 g  1 g Oral TID WC Albertine Patricia, MD   1 g at 05/24/14 1219  . traZODone (DESYREL) tablet 50 mg  50 mg Oral QHS Ambrose Finland, MD   50 mg at 05/23/14 2233    Musculoskeletal: Strength & Muscle Tone: decreased Gait & Station: unable to stand Patient leans: N/A  Psychiatric Specialty Exam: Physical Exam as per history and physical examination   ROS depression, sad, isolated, withdrawn feeling worthless   Blood pressure 128/77, pulse 71, temperature 97.6 F (36.4 C), temperature source Oral, resp. rate 16, height 6' 3"  (1.905 m), weight 125.193 kg (276 lb), SpO2  99 %.Body mass index is 34.5 kg/(m^2).  General Appearance: Casual  Eye Contact::  Good  Speech:  Clear and Coherent  Volume:  Normal  Mood:  Depressed  Affect:  Appropriate and Congruent  Thought Process:  Coherent and Goal Directed  Orientation:  Full (Time, Place, and Person)  Thought Content:  WDL  Suicidal Thoughts:  No  Homicidal Thoughts:  No  Memory:  Immediate;   Good Recent;   Good Remote;   Good  Judgement:  Good  Insight:  Good  Psychomotor Activity:  Normal  Concentration:  Good  Recall:  Good  Fund of Knowledge:Good  Language: Good  Akathisia:  Negative  Handed:  Right  AIMS (if indicated):     Assets:  Communication Skills Desire for Improvement Housing Intimacy Leisure Time Resilience Social Support Talents/Skills  ADL's:  Impaired  Cognition: WNL  Sleep:      Medical Decision Making: New problem, with additional work up planned, Review of Psycho-Social Stressors (1), Review or order clinical lab tests (1), Established Problem, Worsening (2), Review of Last Therapy Session (1), Review or order medicine tests (1), Review of Medication Regimen & Side Effects (2) and Review of New Medication or Change in Dosage (2)  Treatment Plan Summary: Daily contact with patient to assess and evaluate symptoms and progress in treatment and Medication management  Plan:  Continue Fluoxetine 10 mg daily for depression  Continue Trazodone 50 mg at bedtime for insomnia  Monitor for the side effect of the medication Patient does not meet criteria for psychiatric inpatient admission. Supportive therapy provided about ongoing stressors.  Appreciate psychiatric consultation and sign off at this time.  Please contact [phone] or [phone] if needs further assistance  Disposition: Patient will be referred to the outpatient psychiatric services for medication management and counseling services and medically stable.  Laquinn Shippy,JANARDHAHA R. 05/24/2014 12:20 PM

## 2014-05-24 NOTE — Discharge Summary (Signed)
[facility]  Physician Discharge Summary   Patient ID: Duane [name] MRN: [id number] DOB/AGE: May 23, 1975 39 y.o.  Admit date: 05/10/2014 Discharge date: 05/24/2014  PCP: [name], MD  DISCHARGE DIAGNOSES:  Principal Problem:   MDD (major depressive disorder), single episode, severe , no psychosis Active Problems:   HTN (hypertension)   Diabetic gastroparesis   Essential hypertension   Nausea & vomiting   Intractable vomiting with nausea   Intractable cyclical vomiting with nausea   RECOMMENDATIONS FOR OUTPATIENT FOLLOW UP: 1. Patient has been made an appointment at [facility] with a gastroparesis specialist. 2. Started on fluoxetine 3. Home health RN and social worker due to high risk of readmission.  DISCHARGE CONDITION: fair  Diet recommendation: Soft, bland diet for the next 3-4 days and then advance as tolerated.  Filed Weights   05/11/14 1100  Weight: 125.193 kg (276 lb)    INITIAL HISTORY: Duane [name] is a 39 y.o. male with history of diabetes mellitus type 1 who has had recent multiple admissions for gastroparesis related nausea vomiting. He presented with recurrent complaints of nausea and vomiting. He was admitted for further management. Patient has had gastric emptying study in July 2015 which was showing delayed gastric emptying. EGD done in April 2015 was unremarkable. Hospital course complicated by recurrent nausea and vomiting.  Consultations:  Gastroenterology  Psychiatry   HOSPITAL COURSE:   Intractable nausea and vomiting secondary to diabetic gastroparesis. Patient was admitted with intractable nausea and vomiting which was secondary to gastroparesis. Patient has had gastric emptying study in July 2015 which was showing delayed gastric emptying and also had EGD in April 2015 which was unremarkable. Gastroenterology was consulted due to intractable symptoms. Patient was given metoclopramide  intravenously. He was also given Zofran. Phenergan as needed. He was started on erythromycin, however, his nausea got worse. Erythromycin was subsequently stopped. He was also asked to avoid narcotics as it could exacerbate his symptoms. Over the past 72 hours patient hasn't had any vomiting. His diet was slowly advanced to soft. He is tolerated for more than 24 hours. Reglan was changed over to oral. His been ambulating. He feels much better. He is ready for discharge. Gastroenterology has arranged follow-up with a gastroparesis specialist at [facility].  Dehydration with mildly elevated creatinine Improved. Possibly due to multiple episodes of emesis.  Diabetes mellitus type 1 Reasonably well-controlled on current regimen. Hemoglobin A1c 9.2   Essential Hypertension Continue with metoprolol, Norvasc.  Hypokalemia This was repleted  Major depression Patient was noted to have a flat affect. He was very depressed. He was seen by psychiatry and was started on fluoxetine, which will be continued. He was also prescribed trazodone for insomnia.   Patient has had a prolonged hospital stay. He is feeling much better over the last 3-4 days. He is stable for discharge.  PERTINENT LABS:  The results of significant diagnostics from this hospitalization (including imaging, microbiology, ancillary and laboratory) are listed below for reference.     Labs: Basic Metabolic Panel:  Recent Labs Lab 05/19/14 0403 05/21/14 0610 05/22/14 0505 05/23/14 0612 05/24/14 0516  NA 139 138 135 138 139  K 3.7 3.9 3.2* 3.3* 3.4*  CL 103 103 102 105 107  CO2 25 26 26 26 27   GLUCOSE 153* 139* 102* 135* 165*  BUN 8 10 12 9 7   CREATININE 1.07 1.36* 1.39* 1.35 1.27  CALCIUM 8.6 8.6 8.2* 8.1* 8.4   Liver Function Tests:  Recent Labs  Lab 05/19/14 0403  AST 11  ALT 14  ALKPHOS 63  BILITOT 0.8  PROT 6.8  ALBUMIN 3.6   CBC:  Recent Labs Lab 05/24/14 0516  WBC 5.3  HGB 11.4*  HCT  35.1*  MCV 82.2  PLT 263   CBG:  Recent Labs Lab 05/23/14 1118 05/23/14 1710 05/23/14 2122 05/24/14 0746 05/24/14 1130  GLUCAP 161* 139* 152* 133* 156*     IMAGING STUDIES Dg Abd Acute W/chest  04/30/2014   CLINICAL DATA:  Acute onset of generalized abdominal pain and nausea. Initial encounter.  EXAM: ACUTE ABDOMEN SERIES (ABDOMEN 2 VIEW & CHEST 1 VIEW)  COMPARISON:  Chest radiograph performed 12/30/2013, and abdominal radiograph performed 02/03/2014  FINDINGS: The lungs are well-aerated and clear. There is no evidence of focal opacification, pleural effusion or pneumothorax. The cardiomediastinal silhouette is borderline enlarged.  The visualized bowel gas pattern is unremarkable. Scattered stool and air are seen within the colon; there is no evidence of small bowel dilatation to suggest obstruction. No free intra-abdominal air is identified on the provided upright view. Clips are noted within the right upper quadrant, reflecting prior cholecystectomy.  No acute osseous abnormalities are seen; the sacroiliac joints are unremarkable in appearance.  IMPRESSION: 1. Unremarkable bowel gas pattern; no free intra-abdominal air seen. Small to moderate amount of stool noted in the colon. 2. Borderline cardiomegaly; lungs remain clear.   Electronically Signed   By: Garald Balding M.D.   On: 04/30/2014 02:56    DISCHARGE EXAMINATION: Filed Vitals:   05/23/14 0438 05/23/14 1402 05/23/14 2042 05/24/14 0510  BP: 119/69 151/81 125/71 128/77  Pulse: 70 77 78 71  Temp: 98.1 F (36.7 C) 98.2 F (36.8 C) 98.1 F (36.7 C) 97.6 F (36.4 C)  TempSrc: Oral Oral Oral Oral  Resp: 16 18 16 16   Height:      Weight:      SpO2: 100% 99% 99% 99%   General appearance: alert, cooperative, appears stated age and no distress Resp: clear to auscultation bilaterally Cardio: regular rate and rhythm, S1, S2 normal, no murmur, click, rub or gallop GI: soft, non-tender; bowel sounds normal; no masses,  no  organomegaly Neurologic: No focal deficits.   DISPOSITION: Home  Discharge Instructions    Call MD for:  extreme fatigue    Complete by:  As directed      Call MD for:  persistant dizziness or light-headedness    Complete by:  As directed      Call MD for:  persistant nausea and vomiting    Complete by:  As directed      Call MD for:  severe uncontrolled pain    Complete by:  As directed      Discharge instructions    Complete by:  As directed   Please be sure to keep your appointments. Take Reglan 30 minutes before each meal and at bedtime. Soft diet for next 3-4 days and then advance as tolerated. Avoid fried oily foods. Avoid narcotic pain medications.  You were cared for by a hospitalist during your hospital stay. If you have any questions about your discharge medications or the care you received while you were in the hospital after you are discharged, you can call the unit and asked to speak with the hospitalist on call if the hospitalist that took care of you is not available. Once you are discharged, your primary care physician will handle any further medical issues. Please note that NO REFILLS for any discharge  medications will be authorized once you are discharged, as it is imperative that you return to your primary care physician (or establish a relationship with a primary care physician if you do not have one) for your aftercare needs so that they can reassess your need for medications and monitor your lab values. If you do not have a primary care physician, you can call [phone] for a physician referral.     Increase activity slowly    Complete by:  As directed            ALLERGIES: No Known Allergies   Current Discharge Medication List    START taking these medications   Details  feeding supplement, GLUCERNA SHAKE, (GLUCERNA SHAKE) LIQD Take 237 mLs by mouth 3 (three) times daily between meals. Qty: 90 Can, Refills: 0    FLUoxetine (PROZAC) 10 MG capsule Take 1 capsule  (10 mg total) by mouth daily. Qty: 30 capsule, Refills: 1    LORazepam (ATIVAN) 0.5 MG tablet Take 1 tablet (0.5 mg total) by mouth 2 (two) times daily as needed for anxiety. Qty: 20 tablet, Refills: 0    ondansetron (ZOFRAN ODT) 4 MG disintegrating tablet Take 1 tablet (4 mg total) by mouth every 8 (eight) hours as needed for nausea or vomiting. Qty: 20 tablet, Refills: 0    traZODone (DESYREL) 50 MG tablet Take 1 tablet (50 mg total) by mouth at bedtime. Qty: 30 tablet, Refills: 0      CONTINUE these medications which have CHANGED   Details  insulin aspart (NOVOLOG) cartridge Inject 4 Units into the skin 3 (three) times daily with meals. Qty: 15 mL, Refills: 11    insulin glargine (LANTUS) 100 UNIT/ML injection Inject 0.18 mLs (18 Units total) into the skin at bedtime. Qty: 10 mL, Refills: 3    metoCLOPramide (REGLAN) 10 MG tablet Take 1 tablet (10 mg total) by mouth 4 (four) times daily -  before meals and at bedtime. For 1 week then as needed Qty: 120 tablet, Refills: 3      CONTINUE these medications which have NOT CHANGED   Details  amLODipine (NORVASC) 10 MG tablet Take 1 tablet (10 mg total) by mouth every morning. Qty: 30 tablet, Refills: 3    gabapentin (NEURONTIN) 300 MG capsule Take 1 capsule (300 mg total) by mouth at bedtime. Qty: 30 capsule, Refills: 3    glucose blood (ONE TOUCH ULTRA TEST) test strip Use as instructed to check blood sugar once a day dx code 250.00 Qty: 100 each, Refills: 3    Insulin Pen Needle 31G X 8 MM MISC Check blood sugar TID and QHS Qty: 100 each, Refills: 12    Lancets (ONETOUCH ULTRASOFT) lancets Use as instructed Qty: 100 each, Refills: 12    metoprolol (LOPRESSOR) 50 MG tablet Take 1 tablet (50 mg total) by mouth 2 (two) times daily. Qty: 60 tablet, Refills: 3    pantoprazole (PROTONIX) 40 MG tablet Take 1 tablet (40 mg total) by mouth 2 (two) times daily. Qty: 60 tablet, Refills: 3    promethazine (PHENERGAN) 25 MG tablet  Take 1 tablet (25 mg total) by mouth every 8 (eight) hours as needed for nausea or vomiting. Qty: 60 tablet, Refills: 3    sucralfate (CARAFATE) 1 G tablet Take 1 tablet (1 g total) by mouth 3 (three) times daily with meals. Qty: 90 tablet, Refills: 3       Follow-up Information    Follow up with Janey Genta, MD.  Specialty:  Gastroenterology   Why:  Appointment is on June 29 at 3:30 PM. This is for your gastroparesis. Please do not miss this appointment. Arrive 15 mins early.   Contact information:   [address] [place] [address] [phone]       Follow up with [facility]    . Go on 05/27/2014.   Why:  be there at 8:45am. Bring $20 copay, photo ID and hospital discharge paperwork.   Contact information:   [address] [place] [id number] [phone]      Follow up with Tye Savoy, NP On 06/07/2014.   Specialty:  Nurse Practitioner   Why:  at 9:30 am   Contact information:   [place] [place] [address] [phone]       TOTAL DISCHARGE TIME: 72 minutes  [facility] Pager [phone]  05/24/2014, 12:56 PM

## 2014-05-24 NOTE — Discharge Instructions (Signed)

## 2014-05-27 ENCOUNTER — Inpatient Hospital Stay: Payer: Self-pay | Admitting: Family Medicine

## 2014-06-06 ENCOUNTER — Encounter (HOSPITAL_COMMUNITY): Payer: Self-pay | Admitting: Physician Assistant

## 2014-06-07 ENCOUNTER — Ambulatory Visit: Payer: Self-pay

## 2014-06-07 ENCOUNTER — Ambulatory Visit: Payer: Self-pay | Admitting: Nurse Practitioner

## 2014-06-15 ENCOUNTER — Other Ambulatory Visit: Payer: Self-pay | Admitting: Family Medicine

## 2014-06-15 MED ORDER — GLUCOSE BLOOD VI STRP: ORAL_STRIP | Status: DC

## 2014-06-27 ENCOUNTER — Inpatient Hospital Stay (HOSPITAL_COMMUNITY)
Admission: EM | Admit: 2014-06-27 | Discharge: 2014-07-01 | DRG: 074 | Disposition: A | Discharge status: Home / Self Care | Payer: Medicaid Other | Attending: Internal Medicine | Admitting: Internal Medicine

## 2014-06-27 ENCOUNTER — Observation Stay (HOSPITAL_COMMUNITY): Payer: Medicaid Other

## 2014-06-27 ENCOUNTER — Encounter (HOSPITAL_COMMUNITY): Payer: Self-pay | Admitting: Emergency Medicine

## 2014-06-27 DIAGNOSIS — Z79899 Other long term (current) drug therapy: Secondary | ICD-10-CM | POA: Diagnosis not present

## 2014-06-27 DIAGNOSIS — Z794 Long term (current) use of insulin: Secondary | ICD-10-CM | POA: Diagnosis not present

## 2014-06-27 DIAGNOSIS — R1013 Epigastric pain: Secondary | ICD-10-CM | POA: Diagnosis present

## 2014-06-27 DIAGNOSIS — E876 Hypokalemia: Secondary | ICD-10-CM | POA: Diagnosis present

## 2014-06-27 DIAGNOSIS — Z6831 Body mass index (BMI) 31.0-31.9, adult: Secondary | ICD-10-CM | POA: Diagnosis not present

## 2014-06-27 DIAGNOSIS — K2970 Gastritis, unspecified, without bleeding: Secondary | ICD-10-CM | POA: Diagnosis present

## 2014-06-27 DIAGNOSIS — I10 Essential (primary) hypertension: Secondary | ICD-10-CM | POA: Diagnosis present

## 2014-06-27 DIAGNOSIS — E1065 Type 1 diabetes mellitus with hyperglycemia: Secondary | ICD-10-CM | POA: Diagnosis present

## 2014-06-27 DIAGNOSIS — Z833 Family history of diabetes mellitus: Secondary | ICD-10-CM | POA: Diagnosis not present

## 2014-06-27 DIAGNOSIS — K3184 Gastroparesis: Secondary | ICD-10-CM | POA: Diagnosis present

## 2014-06-27 DIAGNOSIS — R1115 Cyclical vomiting syndrome unrelated to migraine: Secondary | ICD-10-CM | POA: Diagnosis present

## 2014-06-27 DIAGNOSIS — N179 Acute kidney failure, unspecified: Secondary | ICD-10-CM | POA: Diagnosis present

## 2014-06-27 DIAGNOSIS — G43A1 Cyclical vomiting, intractable: Secondary | ICD-10-CM | POA: Diagnosis present

## 2014-06-27 DIAGNOSIS — IMO0002 Reserved for concepts with insufficient information to code with codable children: Secondary | ICD-10-CM | POA: Diagnosis present

## 2014-06-27 DIAGNOSIS — K219 Gastro-esophageal reflux disease without esophagitis: Secondary | ICD-10-CM | POA: Diagnosis present

## 2014-06-27 DIAGNOSIS — E1140 Type 2 diabetes mellitus with diabetic neuropathy, unspecified: Secondary | ICD-10-CM | POA: Diagnosis present

## 2014-06-27 DIAGNOSIS — Z9049 Acquired absence of other specified parts of digestive tract: Secondary | ICD-10-CM | POA: Diagnosis present

## 2014-06-27 DIAGNOSIS — D649 Anemia, unspecified: Secondary | ICD-10-CM | POA: Diagnosis present

## 2014-06-27 DIAGNOSIS — F329 Major depressive disorder, single episode, unspecified: Secondary | ICD-10-CM | POA: Diagnosis present

## 2014-06-27 DIAGNOSIS — E669 Obesity, unspecified: Secondary | ICD-10-CM | POA: Diagnosis present

## 2014-06-27 DIAGNOSIS — E860 Dehydration: Secondary | ICD-10-CM | POA: Diagnosis present

## 2014-06-27 DIAGNOSIS — E1043 Type 1 diabetes mellitus with diabetic autonomic (poly)neuropathy: Secondary | ICD-10-CM | POA: Diagnosis present

## 2014-06-27 DIAGNOSIS — F419 Anxiety disorder, unspecified: Secondary | ICD-10-CM | POA: Diagnosis present

## 2014-06-27 DIAGNOSIS — R112 Nausea with vomiting, unspecified: Secondary | ICD-10-CM | POA: Diagnosis present

## 2014-06-27 DIAGNOSIS — R1110 Vomiting, unspecified: Secondary | ICD-10-CM

## 2014-06-27 DIAGNOSIS — E108 Type 1 diabetes mellitus with unspecified complications: Secondary | ICD-10-CM

## 2014-06-27 DIAGNOSIS — E1143 Type 2 diabetes mellitus with diabetic autonomic (poly)neuropathy: Secondary | ICD-10-CM

## 2014-06-27 LAB — COMPREHENSIVE METABOLIC PANEL
ALBUMIN: 3.9 g/dL (ref 3.5–5.0)
ALK PHOS: 71 U/L (ref 38–126)
ALT: 15 U/L — ABNORMAL LOW (ref 17–63)
ANION GAP: 10 (ref 5–15)
AST: 15 U/L (ref 15–41)
BILIRUBIN TOTAL: 0.6 mg/dL (ref 0.3–1.2)
BUN: 21 mg/dL — AB (ref 6–20)
CO2: 20 mmol/L — AB (ref 22–32)
Calcium: 8.6 mg/dL — ABNORMAL LOW (ref 8.9–10.3)
Chloride: 109 mmol/L (ref 101–111)
Creatinine, Ser: 1.54 mg/dL — ABNORMAL HIGH (ref 0.61–1.24)
GFR calc non Af Amer: 56 mL/min — ABNORMAL LOW (ref >60)
Glucose, Bld: 186 mg/dL — ABNORMAL HIGH (ref 65–99)
Potassium: 4.4 mmol/L (ref 3.5–5.1)
Sodium: 139 mmol/L (ref 135–145)
Total Protein: 6.9 g/dL (ref 6.5–8.1)

## 2014-06-27 LAB — URINALYSIS, ROUTINE W REFLEX MICROSCOPIC
Bilirubin Urine: NEGATIVE
Glucose, UA: 100 mg/dL — AB
HGB URINE DIPSTICK: NEGATIVE
Ketones, ur: NEGATIVE mg/dL
Leukocytes, UA: NEGATIVE
Nitrite: NEGATIVE
Protein, ur: NEGATIVE mg/dL
SPECIFIC GRAVITY, URINE: 1.019 (ref 1.005–1.030)
Urobilinogen, UA: 0.2 mg/dL (ref 0.0–1.0)
pH: 5 (ref 5.0–8.0)

## 2014-06-27 LAB — CBC WITH DIFFERENTIAL/PLATELET
Basophils Absolute: 0 10*3/uL (ref 0.0–0.1)
Basophils Relative: 0 % (ref 0–1)
Eosinophils Absolute: 0.2 10*3/uL (ref 0.0–0.7)
Eosinophils Relative: 2 % (ref 0–5)
HCT: 37.3 % — ABNORMAL LOW (ref 39.0–52.0)
Hemoglobin: 12.4 g/dL — ABNORMAL LOW (ref 13.0–17.0)
Lymphocytes Relative: 14 % (ref 12–46)
Lymphs Abs: 1.2 10*3/uL (ref 0.7–4.0)
MCH: 26.7 pg (ref 26.0–34.0)
MCHC: 33.2 g/dL (ref 30.0–36.0)
MCV: 80.2 fL (ref 78.0–100.0)
Monocytes Absolute: 0.3 10*3/uL (ref 0.1–1.0)
Monocytes Relative: 3 % (ref 3–12)
Neutro Abs: 6.9 10*3/uL (ref 1.7–7.7)
Neutrophils Relative %: 81 % — ABNORMAL HIGH (ref 43–77)
Platelets: 206 10*3/uL (ref 150–400)
RBC: 4.65 MIL/uL (ref 4.22–5.81)
RDW: 12.5 % (ref 11.5–15.5)
WBC: 8.5 10*3/uL (ref 4.0–10.5)

## 2014-06-27 LAB — LIPASE, BLOOD: Lipase: 18 U/L — ABNORMAL LOW (ref 22–51)

## 2014-06-27 LAB — GLUCOSE, CAPILLARY
GLUCOSE-CAPILLARY: 103 mg/dL — AB (ref 65–99)
Glucose-Capillary: 107 mg/dL — ABNORMAL HIGH (ref 65–99)
Glucose-Capillary: 82 mg/dL (ref 65–99)

## 2014-06-27 LAB — I-STAT TROPONIN, ED: Troponin i, poc: 0 ng/mL (ref 0.00–0.08)

## 2014-06-27 LAB — CBG MONITORING, ED
GLUCOSE-CAPILLARY: 129 mg/dL — AB (ref 65–99)
Glucose-Capillary: 169 mg/dL — ABNORMAL HIGH (ref 65–99)

## 2014-06-27 MED ORDER — HYDRALAZINE HCL 20 MG/ML IJ SOLN
10.0000 mg | Freq: Four times a day (QID) | INTRAMUSCULAR | Status: DC | PRN
  Administered 2014-06-28: 10 mg via INTRAVENOUS
  Filled 2014-06-27 (×2): qty 1

## 2014-06-27 MED ORDER — SODIUM CHLORIDE 0.9 % IV SOLN
1000.0000 mL | Freq: Once | INTRAVENOUS | Status: AC
  Administered 2014-06-27: 1000 mL via INTRAVENOUS

## 2014-06-27 MED ORDER — INSULIN ASPART 100 UNIT/ML ~~LOC~~ SOLN: 0.0000 [IU] | Freq: Every day | SUBCUTANEOUS | Status: DC

## 2014-06-27 MED ORDER — ONDANSETRON HCL 4 MG PO TABS: 4.0000 mg | ORAL_TABLET | Freq: Four times a day (QID) | ORAL | Status: DC | PRN

## 2014-06-27 MED ORDER — SODIUM CHLORIDE 0.9 % IV SOLN
1000.0000 mL | INTRAVENOUS | Status: DC
  Administered 2014-06-27: 1000 mL via INTRAVENOUS

## 2014-06-27 MED ORDER — HEPARIN SODIUM (PORCINE) 5000 UNIT/ML IJ SOLN
5000.0000 [IU] | Freq: Three times a day (TID) | INTRAMUSCULAR | Status: DC
  Administered 2014-06-27 – 2014-07-01 (×9): 5000 [IU] via SUBCUTANEOUS
  Filled 2014-06-27 (×15): qty 1

## 2014-06-27 MED ORDER — ONDANSETRON HCL 4 MG/2ML IJ SOLN
4.0000 mg | Freq: Four times a day (QID) | INTRAMUSCULAR | Status: DC | PRN
  Administered 2014-06-27 – 2014-07-01 (×9): 4 mg via INTRAVENOUS
  Filled 2014-06-27 (×9): qty 2

## 2014-06-27 MED ORDER — HYDROCODONE-ACETAMINOPHEN 5-325 MG PO TABS
1.0000 | ORAL_TABLET | ORAL | Status: DC | PRN
  Administered 2014-06-27 – 2014-06-28 (×3): 1 via ORAL
  Filled 2014-06-27 (×3): qty 1

## 2014-06-27 MED ORDER — GUAIFENESIN-DM 100-10 MG/5ML PO SYRP
5.0000 mL | ORAL_SOLUTION | ORAL | Status: DC | PRN
Start: 2014-06-27 — End: 2014-07-01

## 2014-06-27 MED ORDER — SODIUM CHLORIDE 0.9 % IV SOLN: INTRAVENOUS | Status: DC

## 2014-06-27 MED ORDER — METOPROLOL TARTRATE 50 MG PO TABS
50.0000 mg | ORAL_TABLET | Freq: Two times a day (BID) | ORAL | Status: DC
  Administered 2014-06-27 – 2014-07-01 (×8): 50 mg via ORAL
  Filled 2014-06-27 (×10): qty 1

## 2014-06-27 MED ORDER — HYDROMORPHONE HCL 1 MG/ML IJ SOLN
1.0000 mg | Freq: Once | INTRAMUSCULAR | Status: AC
  Administered 2014-06-27: 1 mg via INTRAVENOUS
  Filled 2014-06-27: qty 1

## 2014-06-27 MED ORDER — LORAZEPAM 0.5 MG PO TABS: 0.5000 mg | ORAL_TABLET | Freq: Two times a day (BID) | ORAL | Status: DC | PRN

## 2014-06-27 MED ORDER — PANTOPRAZOLE SODIUM 40 MG IV SOLR
40.0000 mg | Freq: Two times a day (BID) | INTRAVENOUS | Status: DC
  Administered 2014-06-27 – 2014-07-01 (×9): 40 mg via INTRAVENOUS
  Filled 2014-06-27 (×10): qty 40

## 2014-06-27 MED ORDER — INSULIN GLARGINE 100 UNIT/ML ~~LOC~~ SOLN
8.0000 [IU] | Freq: Two times a day (BID) | SUBCUTANEOUS | Status: DC
  Administered 2014-06-27 – 2014-06-30 (×7): 8 [IU] via SUBCUTANEOUS
  Filled 2014-06-27 (×9): qty 0.08

## 2014-06-27 MED ORDER — AMLODIPINE BESYLATE 10 MG PO TABS
10.0000 mg | ORAL_TABLET | Freq: Every morning | ORAL | Status: DC
  Administered 2014-06-27 – 2014-07-01 (×5): 10 mg via ORAL
  Filled 2014-06-27 (×5): qty 1

## 2014-06-27 MED ORDER — PROMETHAZINE HCL 25 MG/ML IJ SOLN
25.0000 mg | Freq: Once | INTRAMUSCULAR | Status: AC
  Administered 2014-06-27: 25 mg via INTRAMUSCULAR
  Filled 2014-06-27: qty 1

## 2014-06-27 MED ORDER — PROMETHAZINE HCL 25 MG/ML IJ SOLN
12.5000 mg | Freq: Once | INTRAMUSCULAR | Status: AC
  Administered 2014-06-27: 12.5 mg via INTRAVENOUS
  Filled 2014-06-27: qty 1

## 2014-06-27 MED ORDER — SUCRALFATE 1 G PO TABS
1.0000 g | ORAL_TABLET | Freq: Three times a day (TID) | ORAL | Status: DC
  Administered 2014-06-27 – 2014-07-01 (×11): 1 g via ORAL
  Filled 2014-06-27 (×15): qty 1

## 2014-06-27 MED ORDER — PROMETHAZINE HCL 25 MG/ML IJ SOLN: 25.0000 mg | Freq: Once | INTRAMUSCULAR | Status: DC

## 2014-06-27 MED ORDER — TRAZODONE HCL 50 MG PO TABS: 50.0000 mg | ORAL_TABLET | Freq: Every day | ORAL | Status: DC

## 2014-06-27 MED ORDER — SODIUM CHLORIDE 0.9 % IV SOLN
1000.0000 mL | INTRAVENOUS | Status: DC
  Administered 2014-06-27 – 2014-06-28 (×2): 1000 mL via INTRAVENOUS

## 2014-06-27 MED ORDER — METOCLOPRAMIDE HCL 5 MG/ML IJ SOLN
10.0000 mg | Freq: Three times a day (TID) | INTRAMUSCULAR | Status: DC
  Administered 2014-06-27 – 2014-07-01 (×17): 10 mg via INTRAVENOUS
  Filled 2014-06-27 (×19): qty 2

## 2014-06-27 MED ORDER — ONDANSETRON HCL 4 MG/2ML IJ SOLN
4.0000 mg | Freq: Once | INTRAMUSCULAR | Status: AC
  Administered 2014-06-27: 4 mg via INTRAVENOUS
  Filled 2014-06-27: qty 2

## 2014-06-27 MED ORDER — FLUOXETINE HCL 10 MG PO CAPS: 10.0000 mg | ORAL_CAPSULE | Freq: Every day | ORAL | Status: DC

## 2014-06-27 MED ORDER — INSULIN ASPART 100 UNIT/ML ~~LOC~~ SOLN
0.0000 [IU] | Freq: Three times a day (TID) | SUBCUTANEOUS | Status: DC
  Administered 2014-06-28 – 2014-06-29 (×2): 1 [IU] via SUBCUTANEOUS
  Administered 2014-06-29: 3 [IU] via SUBCUTANEOUS
  Administered 2014-07-01: 2 [IU] via SUBCUTANEOUS

## 2014-06-27 MED ORDER — FLUOXETINE HCL 10 MG PO CAPS
10.0000 mg | ORAL_CAPSULE | Freq: Every day | ORAL | Status: DC
  Administered 2014-06-27 – 2014-07-01 (×5): 10 mg via ORAL
  Filled 2014-06-27 (×5): qty 1

## 2014-06-27 MED ORDER — PROMETHAZINE HCL 25 MG PO TABS
25.0000 mg | ORAL_TABLET | Freq: Three times a day (TID) | ORAL | Status: DC | PRN
  Filled 2014-06-27: qty 1

## 2014-06-27 NOTE — ED Notes (Signed)
Patient states his gastroparesis flared up in the last day or so, reports emesis (too numerous to count) and diarrhea today (black per patient). Patient states he also began having chest pains tonight after vomiting, central chest, described as burning, reports history of same with gastroparesis in past.

## 2014-06-27 NOTE — ED Notes (Signed)
Attemtped lab draw x 2 but unsuccessful. RN, Rica Mote made aware.

## 2014-06-27 NOTE — ED Provider Notes (Signed)
CSN: [id number]     Arrival date & time 06/27/14  0012 History   First MD Initiated Contact with Patient 06/27/14 [phone]     Chief Complaint  Patient presents with  . Abdominal Pain  . Nausea  . Diarrhea    "black"  . Chest Pain    central chest, started after vomiting     (Consider location/radiation/quality/duration/timing/severity/associated sxs/prior Treatment) Patient is a 39 y.o. male presenting with abdominal pain, diarrhea, and chest pain. The history is provided by the patient. No language interpreter was used.  Abdominal Pain Pain location:  Generalized Pain quality: cramping and sharp   Associated symptoms: chest pain, diarrhea, nausea and vomiting   Associated symptoms: no fever and no shortness of breath   Associated symptoms comment:  Patient with a history of gastroparesis presents with recurrent severe abdominal pain, nausea and vomiting despite use of Reglan at home. No fever. He denies hematemesis.  Diarrhea Associated symptoms: abdominal pain and vomiting   Associated symptoms: no fever and no myalgias   Chest Pain Associated symptoms: abdominal pain, nausea, vomiting and weakness   Associated symptoms: no fever and no shortness of breath     Past Medical History  Diagnosis Date  . Hypertension   . Diabetes mellitus without complication age 35    insulin requiring from the start  . Gastritis   . Gastroparesis 2012    100% gastric retention on 08/2013 GES  . Obesity   . Diabetic neuropathy   . GERD (gastroesophageal reflux disease)    Past Surgical History  Procedure Laterality Date  . Cholecystectomy  2013  . Knee surgery Left   . Flexible sigmoidoscopy N/A 06/11/2013    Procedure: FLEXIBLE SIGMOIDOSCOPY;  Surgeon: [name], MD;  Location: [facility];  Service: Endoscopy;  Laterality: N/A;  . Esophagogastroduodenoscopy N/A 06/11/2013    Procedure: ESOPHAGOGASTRODUODENOSCOPY (EGD);  Surgeon: [name], MD;  Location: [facility];  Service:  Endoscopy;  Laterality: N/A;   Family History  Problem Relation Age of Onset  . Diabetes Father    History  Substance Use Topics  . Smoking status: Never Smoker   . Smokeless tobacco: Never Used  . Alcohol Use: Yes     Comment: "none really" - 08/27/13    Review of Systems  Constitutional: Negative for fever.  Respiratory: Negative for shortness of breath.   Cardiovascular: Positive for chest pain.  Gastrointestinal: Positive for nausea, vomiting, abdominal pain and diarrhea.  Musculoskeletal: Negative for myalgias.  Skin: Negative for rash.  Neurological: Positive for weakness.      Allergies  Review of patient's allergies indicates no known allergies.  Home Medications   Prior to Admission medications   Medication Sig Start Date End Date Taking? Authorizing Provider  amLODipine (NORVASC) 10 MG tablet Take 1 tablet (10 mg total) by mouth every morning. 05/03/14   [name], MD  feeding supplement, GLUCERNA SHAKE, (GLUCERNA SHAKE) LIQD Take 237 mLs by mouth 3 (three) times daily between meals. 05/24/14   Bonnielee Haff, MD  FLUoxetine (PROZAC) 10 MG capsule Take 1 capsule (10 mg total) by mouth daily. 05/24/14   Bonnielee Haff, MD  gabapentin (NEURONTIN) 300 MG capsule Take 1 capsule (300 mg total) by mouth at bedtime. 05/07/14   Brayton Caves, PA-C  glucose blood (ONE TOUCH ULTRA TEST) test strip Use as instructed to check blood sugar once a day dx code 250.00 06/15/14   Josalyn Funches, MD  insulin aspart (NOVOLOG) cartridge Inject 4 Units into  the skin 3 (three) times daily with meals. 05/13/14   Silver Huguenin Elgergawy, MD  insulin glargine (LANTUS) 100 UNIT/ML injection Inject 0.18 mLs (18 Units total) into the skin at bedtime. 05/13/14   Albertine Patricia, MD  Insulin Pen Needle 31G X 8 MM MISC Check blood sugar TID and QHS 05/07/14   Brayton Caves, PA-C  Lancets [facility] ULTRASOFT) lancets Use as instructed 05/07/14   Brayton Caves, PA-C  LORazepam (ATIVAN) 0.5 MG tablet Take  1 tablet (0.5 mg total) by mouth 2 (two) times daily as needed for anxiety. 05/24/14   Bonnielee Haff, MD  metoCLOPramide (REGLAN) 10 MG tablet Take 1 tablet (10 mg total) by mouth 4 (four) times daily -  before meals and at bedtime. For 1 week then as needed 05/24/14   Bonnielee Haff, MD  metoprolol (LOPRESSOR) 50 MG tablet Take 1 tablet (50 mg total) by mouth 2 (two) times daily. 05/07/14   Brayton Caves, PA-C  ondansetron (ZOFRAN ODT) 4 MG disintegrating tablet Take 1 tablet (4 mg total) by mouth every 8 (eight) hours as needed for nausea or vomiting. 05/24/14   Bonnielee Haff, MD  pantoprazole (PROTONIX) 40 MG tablet Take 1 tablet (40 mg total) by mouth 2 (two) times daily. 05/07/14   Tiffany Daneil Dan, PA-C  promethazine (PHENERGAN) 25 MG tablet Take 1 tablet (25 mg total) by mouth every 8 (eight) hours as needed for nausea or vomiting. 05/07/14   Brayton Caves, PA-C  sucralfate (CARAFATE) 1 G tablet Take 1 tablet (1 g total) by mouth 3 (three) times daily with meals. 05/07/14   Brayton Caves, PA-C  traZODone (DESYREL) 50 MG tablet Take 1 tablet (50 mg total) by mouth at bedtime. 05/24/14   Bonnielee Haff, MD   BP 160/85 mmHg  Pulse 86  Temp(Src) 98.8 F (37.1 C) (Oral)  Resp 20  SpO2 99% Physical Exam  Constitutional: He is oriented to person, place, and time. He appears well-developed and well-nourished.  HENT:  Head: Normocephalic.  Neck: Normal range of motion. Neck supple.  Cardiovascular: Normal rate and regular rhythm.   Pulmonary/Chest: Effort normal and breath sounds normal. He has no wheezes. He has no rales.  Abdominal: Soft. Bowel sounds are normal. There is tenderness. There is no rebound and no guarding.  Diffuse abdominal tenderness.   Musculoskeletal: Normal range of motion.  Neurological: He is alert and oriented to person, place, and time.  Skin: Skin is warm. No rash noted. He is diaphoretic.  Psychiatric: He has a normal mood and affect.    ED Course  Procedures  (including critical care time) Labs Review Labs Reviewed  CBC WITH DIFFERENTIAL/PLATELET - Abnormal; Notable for the following:    Hemoglobin 12.4 (*)    HCT 37.3 (*)    Neutrophils Relative % 81 (*)    All other components within normal limits  COMPREHENSIVE METABOLIC PANEL - Abnormal; Notable for the following:    CO2 20 (*)    Glucose, Bld 186 (*)    BUN 21 (*)    Creatinine, Ser 1.54 (*)    Calcium 8.6 (*)    ALT 15 (*)    GFR calc non Af Amer 56 (*)    All other components within normal limits  LIPASE, BLOOD - Abnormal; Notable for the following:    Lipase 18 (*)    All other components within normal limits  CBG MONITORING, ED - Abnormal; Notable for the following:    Glucose-Capillary  169 (*)    All other components within normal limits  CBG MONITORING, ED - Abnormal; Notable for the following:    Glucose-Capillary 129 (*)    All other components within normal limits  URINALYSIS, ROUTINE W REFLEX MICROSCOPIC  I-STAT TROPOININ, ED    Imaging Review No results found.   EKG Interpretation   Date/Time:  Sunday Jun 27 2014 00:44:27 EDT Ventricular Rate:  101 PR Interval:  169 QRS Duration: 88 QT Interval:  331 QTC Calculation: 429 R Axis:   51 Text Interpretation:  Sinus tachycardia Borderline T wave abnormalities No  significant change since last tracing Confirmed by OTTER  MD, OLGA ([id number])  on 06/27/2014 12:52:22 AM      MDM   Final diagnoses:  None  1. Gastroparesis 2. Intractable vomiting  Medications provided with multiple liter boluses NS. On re-evaluation the patient is alternately resting and actively vomiting thin bile appearing emesis that is non-bloody.   Multiple attempts to control vomiting failed. Will admit for intractable vomiting. Discussed with hospitalist who accepts for admission.    Charlann Lange, PA-C 06/27/14 2144  Linton Flemings, MD 06/28/14 [phone]

## 2014-06-27 NOTE — ED Notes (Signed)
RT to bedside for arterial stick per PA as d/t multiple venous attempts.

## 2014-06-27 NOTE — ED Notes (Signed)
Pt just given phenergan IV and is now vomiting.

## 2014-06-27 NOTE — ED Notes (Signed)
Pt requesting IV pain medication. Admitting orders-Discussed IV zofran and then PO ativan and pain medication. Pt declined. Discussed pt would be transferred to the floor and Receiving RN was made aware pt would like MD called.

## 2014-06-27 NOTE — H&P (Addendum)
Patient Demographics  Keno Tuckerman, is a 39 y.o. male  MRN: [id number]   DOB - 12/07/1975  Admit Date - 06/27/2014  Outpatient Primary MD for the patient is NNODI, Doreene Burke, MD GI -  Dr. Olevia Perches  With History of -  Past Medical History  Diagnosis Date  . Hypertension   . Diabetes mellitus without complication age 46    insulin requiring from the start  . Gastritis   . Gastroparesis 2012    100% gastric retention on 08/2013 GES  . Obesity   . Diabetic neuropathy   . GERD (gastroesophageal reflux disease)       Past Surgical History  Procedure Laterality Date  . Cholecystectomy  2013  . Knee surgery Left   . Flexible sigmoidoscopy N/A 06/11/2013    Procedure: FLEXIBLE SIGMOIDOSCOPY;  Surgeon: [name], MD;  Location: [facility];  Service: Endoscopy;  Laterality: N/A;  . Esophagogastroduodenoscopy N/A 06/11/2013    Procedure: ESOPHAGOGASTRODUODENOSCOPY (EGD);  Surgeon: [name], MD;  Location: [facility];  Service: Endoscopy;  Laterality: N/A;    in for   Chief Complaint  Patient presents with  . Abdominal Pain  . Nausea  . Diarrhea    "black"  . Chest Pain    central chest, started after vomiting     HPI  Free Diop  is a 39 y.o. male,  history of poorly controlled DM-1, but he gastroparesis, obesity, diabetic neuropathy, GERD, gastritis or depression who supposed to be following with [facility] gastroparesis specialist but has not been there yet, was admitted one month ago for diabetic gastroparesis worsening comes in with 2 day history of nausea vomiting which started after a brief episode of diarrhea caused by gastroenteritis. He says about 3-4 days ago he had couple of days of diarrhea which has resolved since yesterday, however sometime yesterday he developed some  nausea vomiting along with epigastric abdominal pain which is nonradiating.  He came to the ER where his workup was consistent with diabetic gastroparesis worsening, dehydration with acute renal failure, I was called to admit the patient. He currently besides his nausea vomiting and mild nonradiating dull intermittent epigastric abdominal pain which is worse after consuming food & better with bowel rest, associated with vomiting has no other complaints.  Review of Systems    In addition to the HPI above,   No Fever-chills, No Headache, No changes with Vision or hearing, No problems swallowing food or Liquids, No Chest pain, Cough or Shortness of Breath, Mild epigastric pain with ongoing nausea vomiting, had diarrhea a few days ago which has resolved.   Bowel movements are regular, No Blood in stool or Urine, No dysuria, No new skin rashes or bruises, No new joints pains-aches,  No new weakness, tingling, numbness in any extremity, No recent weight gain or loss, No polyuria, polydypsia or polyphagia, No significant Mental Stressors.  A full 10 point Review of Systems  was done, except as stated above, all other Review of Systems were negative.   Social History History  Substance Use Topics  . Smoking status: Never Smoker   . Smokeless tobacco: Never Used  . Alcohol Use: Yes     Comment: "none really" - 08/27/13    Mobility - walks without assistance     Family History Family History  Problem Relation Age of Onset  . Diabetes Father       Prior to Admission medications   Medication Sig Start Date End Date Taking? Authorizing Provider  amLODipine (NORVASC) 10 MG tablet Take 1 tablet (10 mg total) by mouth every morning. 05/03/14  Yes [name], MD  gabapentin (NEURONTIN) 300 MG capsule Take 1 capsule (300 mg total) by mouth at bedtime. 05/07/14  Yes Tiffany Daneil Dan, PA-C  glucose blood (ONE TOUCH ULTRA TEST) test strip Use as instructed to check blood sugar once a day dx  code 250.00 06/15/14  Yes Josalyn Funches, MD  insulin aspart (NOVOLOG) cartridge Inject 4 Units into the skin 3 (three) times daily with meals. 05/13/14  Yes Silver Huguenin Elgergawy, MD  insulin glargine (LANTUS) 100 UNIT/ML injection Inject 0.18 mLs (18 Units total) into the skin at bedtime. Patient taking differently: Inject 18-20 Units into the skin at bedtime.  05/13/14  Yes Albertine Patricia, MD  Insulin Pen Needle 31G X 8 MM MISC Check blood sugar TID and QHS 05/07/14  Yes Tiffany Daneil Dan, PA-C  Lancets [facility] ULTRASOFT) lancets Use as instructed 05/07/14  Yes Tiffany Daneil Dan, PA-C  metoCLOPramide (REGLAN) 10 MG tablet Take 1 tablet (10 mg total) by mouth 4 (four) times daily -  before meals and at bedtime. For 1 week then as needed 05/24/14  Yes Bonnielee Haff, MD  metoprolol (LOPRESSOR) 50 MG tablet Take 1 tablet (50 mg total) by mouth 2 (two) times daily. 05/07/14  Yes Tiffany Daneil Dan, PA-C  pantoprazole (PROTONIX) 40 MG tablet Take 1 tablet (40 mg total) by mouth 2 (two) times daily. 05/07/14  Yes Tiffany Daneil Dan, PA-C  promethazine (PHENERGAN) 25 MG tablet Take 1 tablet (25 mg total) by mouth every 8 (eight) hours as needed for nausea or vomiting. 05/07/14  Yes Brayton Caves, PA-C  sucralfate (CARAFATE) 1 G tablet Take 1 tablet (1 g total) by mouth 3 (three) times daily with meals. 05/07/14  Yes Tiffany Daneil Dan, PA-C  feeding supplement, GLUCERNA SHAKE, (GLUCERNA SHAKE) LIQD Take 237 mLs by mouth 3 (three) times daily between meals. Patient not taking: Reported on 06/27/2014 05/24/14   Bonnielee Haff, MD  FLUoxetine (PROZAC) 10 MG capsule Take 1 capsule (10 mg total) by mouth daily. Patient not taking: Reported on 06/27/2014 05/24/14   Bonnielee Haff, MD  LORazepam (ATIVAN) 0.5 MG tablet Take 1 tablet (0.5 mg total) by mouth 2 (two) times daily as needed for anxiety. Patient not taking: Reported on 06/27/2014 05/24/14   Bonnielee Haff, MD  ondansetron (ZOFRAN ODT) 4 MG disintegrating tablet Take 1 tablet (4  mg total) by mouth every 8 (eight) hours as needed for nausea or vomiting. Patient not taking: Reported on 06/27/2014 05/24/14   Bonnielee Haff, MD  traZODone (DESYREL) 50 MG tablet Take 1 tablet (50 mg total) by mouth at bedtime. Patient not taking: Reported on 06/27/2014 05/24/14   Bonnielee Haff, MD    No Known Allergies  Physical Exam  Vitals  Blood pressure 171/106, pulse 100, temperature 98.6 F (37 C), temperature source Oral, resp. rate 18,  height 6\' 3"  (1.905 m), weight 111 kg (244 lb 11.4 oz), SpO2 100 %.   1. General middle-aged obese African-American male lying in bed in NAD,  2. Normal affect and insight, Not Suicidal or Homicidal, Awake Alert, Oriented X 3.  3. No F.N deficits, ALL C.Nerves Intact, Strength 5/5 all 4 extremities, Sensation intact all 4 extremities, Plantars down going.  4. Ears and Eyes appear Normal, Conjunctivae clear, PERRLA. Moist Oral Mucosa.  5. Supple Neck, No JVD, No cervical lymphadenopathy appriciated, No Carotid Bruits.  6. Symmetrical Chest wall movement, Good air movement bilaterally, CTAB.  7. RRR, No Gallops, Rubs or Murmurs, No Parasternal Heave.  8. Positive Bowel Sounds, Abdomen Soft, mild epigastric tenderness, No organomegaly appriciated,No rebound -guarding or rigidity.  9.  No Cyanosis, Normal Skin Turgor, No Skin Rash or Bruise.  10. Good muscle tone,  joints appear normal , no effusions, Normal ROM.  11. No Palpable Lymph Nodes in Neck or Axillae     Data Review  CBC  Recent Labs Lab 06/27/14 0313  WBC 8.5  HGB 12.4*  HCT 37.3*  PLT 206  MCV 80.2  MCH 26.7  MCHC 33.2  RDW 12.5  LYMPHSABS 1.2  MONOABS 0.3  EOSABS 0.2  BASOSABS 0.0   ------------------------------------------------------------------------------------------------------------------  Chemistries   Recent Labs Lab 06/27/14 0313  NA 139  K 4.4  CL 109  CO2 20*  GLUCOSE 186*  BUN 21*  CREATININE 1.54*  CALCIUM 8.6*  AST 15  ALT 15*    ALKPHOS 71  BILITOT 0.6   ------------------------------------------------------------------------------------------------------------------ estimated creatinine clearance is 88.3 mL/min (by C-G formula based on Cr of 1.54). ------------------------------------------------------------------------------------------------------------------ No results for input(s): TSH, T4TOTAL, T3FREE, THYROIDAB in the last 72 hours.  Invalid input(s): FREET3   Coagulation profile No results for input(s): INR, PROTIME in the last 168 hours. ------------------------------------------------------------------------------------------------------------------- No results for input(s): DDIMER in the last 72 hours. -------------------------------------------------------------------------------------------------------------------  Cardiac Enzymes No results for input(s): CKMB, TROPONINI, MYOGLOBIN in the last 168 hours.  Invalid input(s): CK ------------------------------------------------------------------------------------------------------------------ Invalid input(s): POCBNP   ---------------------------------------------------------------------------------------------------------------  Urinalysis    Component Value Date/Time   COLORURINE YELLOW 06/27/2014 0748   APPEARANCEUR CLOUDY* 06/27/2014 0748   LABSPEC 1.019 06/27/2014 0748   PHURINE 5.0 06/27/2014 0748   GLUCOSEU 100* 06/27/2014 0748   HGBUR NEGATIVE 06/27/2014 0748   BILIRUBINUR NEGATIVE 06/27/2014 0748   KETONESUR NEGATIVE 06/27/2014 0748   PROTEINUR NEGATIVE 06/27/2014 0748   UROBILINOGEN 0.2 06/27/2014 0748   NITRITE NEGATIVE 06/27/2014 0748   LEUKOCYTESUR NEGATIVE 06/27/2014 0748   Lab Results  Component Value Date   HGBA1C 9.2* 05/03/2014    ----------------------------------------------------------------------------------------------------------------  Imaging results:   Dg Abd Portable 1v  06/27/2014   CLINICAL DATA:   Nausea/vomiting/diarrhea and chest pain present today  EXAM: PORTABLE ABDOMEN - 1 VIEW  COMPARISON:  04/30/2014  FINDINGS: There no dilated loops of large or small bowel. Gas and stool in the rectum. No pathologic calcifications. No organomegaly. Postcholecystectomy.  IMPRESSION: No abdominal abnormality by radiograph.   Electronically Signed   By: Suzy Bouchard M.D.   On: 06/27/2014 11:49         Assessment & Plan   1. Intractable nausea vomiting secondary to flareup of diabetic gastroparesis along with gastritis. Patient has been extensively worked up locally here by Dr. Olevia Perches, he has been referred to [facility]. For now he will be admitted to a MedSurg bed, bowel rest with liquid diet, Reglan pre-meal, Phenergan and Zofran. Gentle hydration. PPI continue home dose  Carafate. Once improved discharge with [facility] follow-up.   2. Dehydration with acute renal failure. Due to #1 above, hydrate and monitor BMP he did avoid nephrotoxins for now.   3. DM type I. In poor control. Prostate A1c was 9.3 - place him on Lantus twice a day along with sliding scale and monitor CBGs closely.   4. GERD and gastritis. PPI IV for now.   5. Hypertension. On Lopressor, will add as needed IV hydralazine for better control.   6. Depression and anxiety. He is on trazodone along with Prozac. Both will be continued. He is not suicidal or homicidal.   We will check daily EKG to monitor QTC as he is on Reglan, as needed Zofran, trazodone and Prozac.  QTC yesterday and today is under 430 ms.    DVT Prophylaxis Heparin   AM Labs Ordered, also please review Full Orders  Family Communication: Admission, patients condition and plan of care including tests being ordered have been discussed with the patient who indicates understanding and agree with the plan and Code Status.  Code Status Full  Likely DC to  Home 1-2 days  Condition Fair  Time spent in minutes : 35    SINGH,PRASHANT K M.D  on 06/27/2014 at 11:59 AM  Between 7am to 7pm - Pager - [phone]  After 7pm go to [email or web address] - password [facility]  [facility]  Office  [phone]

## 2014-06-27 NOTE — ED Notes (Signed)
MD at bedside. HOSPITALIST PRESENT

## 2014-06-28 LAB — HEMOGLOBIN A1C
Hgb A1c MFr Bld: 8.3 % — ABNORMAL HIGH (ref 4.8–5.6)
MEAN PLASMA GLUCOSE: 192 mg/dL

## 2014-06-28 LAB — COMPREHENSIVE METABOLIC PANEL
ALBUMIN: 3.2 g/dL — AB (ref 3.5–5.0)
ALK PHOS: 62 U/L (ref 38–126)
ALT: 10 U/L — ABNORMAL LOW (ref 17–63)
ANION GAP: 9 (ref 5–15)
AST: 12 U/L — ABNORMAL LOW (ref 15–41)
BUN: 12 mg/dL (ref 6–20)
CALCIUM: 8.4 mg/dL — AB (ref 8.9–10.3)
CO2: 21 mmol/L — ABNORMAL LOW (ref 22–32)
Chloride: 110 mmol/L (ref 101–111)
Creatinine, Ser: 1.15 mg/dL (ref 0.61–1.24)
GFR calc Af Amer: >60 mL/min (ref >60)
GFR calc non Af Amer: >60 mL/min (ref >60)
Glucose, Bld: 85 mg/dL (ref 65–99)
POTASSIUM: 3.7 mmol/L (ref 3.5–5.1)
Sodium: 140 mmol/L (ref 135–145)
TOTAL PROTEIN: 5.5 g/dL — AB (ref 6.5–8.1)
Total Bilirubin: 0.7 mg/dL (ref 0.3–1.2)

## 2014-06-28 LAB — CBC
HEMATOCRIT: 35.5 % — AB (ref 39.0–52.0)
Hemoglobin: 11.7 g/dL — ABNORMAL LOW (ref 13.0–17.0)
MCH: 26.8 pg (ref 26.0–34.0)
MCHC: 33 g/dL (ref 30.0–36.0)
MCV: 81.2 fL (ref 78.0–100.0)
Platelets: 211 10*3/uL (ref 150–400)
RBC: 4.37 MIL/uL (ref 4.22–5.81)
RDW: 12.4 % (ref 11.5–15.5)
WBC: 5.3 10*3/uL (ref 4.0–10.5)

## 2014-06-28 LAB — GLUCOSE, CAPILLARY
GLUCOSE-CAPILLARY: 115 mg/dL — AB (ref 65–99)
GLUCOSE-CAPILLARY: 126 mg/dL — AB (ref 65–99)
Glucose-Capillary: 85 mg/dL (ref 65–99)
Glucose-Capillary: 112 mg/dL — ABNORMAL HIGH (ref 65–99)

## 2014-06-28 LAB — MAGNESIUM: Magnesium: 1.5 mg/dL — ABNORMAL LOW (ref 1.7–2.4)

## 2014-06-28 MED ORDER — MORPHINE SULFATE 2 MG/ML IJ SOLN
2.0000 mg | INTRAMUSCULAR | Status: AC | PRN
  Administered 2014-06-28 – 2014-06-29 (×2): 2 mg via INTRAVENOUS
  Filled 2014-06-28 (×2): qty 1

## 2014-06-28 MED ORDER — SODIUM CHLORIDE 0.9 % IV SOLN
INTRAVENOUS | Status: DC
  Administered 2014-06-28: 20:00:00 via INTRAVENOUS
  Administered 2014-06-29: 1000 mL via INTRAVENOUS
  Administered 2014-06-29: 22:00:00 via INTRAVENOUS
  Administered 2014-06-30: 1000 mL via INTRAVENOUS
  Administered 2014-06-30: 22:00:00 via INTRAVENOUS

## 2014-06-28 MED ORDER — MAGNESIUM SULFATE 2 GM/50ML IV SOLN
2.0000 g | Freq: Once | INTRAVENOUS | Status: AC
  Administered 2014-06-28: 2 g via INTRAVENOUS
  Filled 2014-06-28: qty 50

## 2014-06-28 MED ORDER — PROMETHAZINE HCL 25 MG/ML IJ SOLN
12.5000 mg | Freq: Once | INTRAMUSCULAR | Status: AC
  Administered 2014-06-28: 12.5 mg via INTRAVENOUS
  Filled 2014-06-28: qty 1

## 2014-06-28 NOTE — Progress Notes (Signed)
Patient ID: Duane [name], male   DOB: Mar 14, 1975, 39 y.o.   MRN: [id number] [facility] PROGRESS NOTE  Duane [name] [id number] DOB: 12-21-1975 DOA: 06/27/2014 PCP: Duane Pound, MD   Brief narrative:    39 y.o. male, history of poorly controlled DM-1, gastroparesis, obesity, diabetic neuropathy, GERD, gastritis, depression who supposed to be following with [name] but has not been there yet, was admitted one month ago for diabetic gastroparesis, now presented with 2 day history of nausea, vomiting which started after a brief episode of diarrhea caused by gastroenteritis.   Assessment/Plan:    Principal Problem:   Type I diabetes mellitus with complication, gastroparesis and neuropathies - continue long acting insulin with addition of SSI - readjust the regimen as clinically indicated  Active Problems:   Diabetic gastroparesis - continue Reglan, also continue antiemetics as needed    Diabetic neuropathy - bothering him this AM - will add Gabapentin    Essential hypertension - reasonable control - continue Norvasc and Metoprolol   Acute renal failure - pre renal etiology - IVF have been provided and Cr is now NWL - repeat BMP in AM   Hypomagnesemia - supplement via IV and repeat Mg in AM   GERD (gastroesophageal reflux disease) - continue protonix    Obesity  - Body mass index is 31.19 kg/(m^2).   DVT prophylaxis - Heparin SQ  Code Status: Full.  Family Communication:  plan of care discussed with the patient Disposition Plan: Home when stable.   IV access:  Peripheral IV  Procedures and diagnostic studies:    Dg Abd Portable 06/27/2014   No abdominal abnormality by radiograph.    Medical Consultants:  None   Other Consultants:  None   IAnti-Infectives:   None   Duane Ramsay, MD  [facility] Pager [phone]  If 7PM-7AM, please contact night-coverage [email or web address] Password [facility] 06/28/2014, 6:48 AM   LOS: 1  day   HPI/Subjective: No events overnight.   Objective: Filed Vitals:   06/27/14 0905 06/27/14 1245 06/27/14 2027 06/28/14 0515  BP: 171/106 137/76 118/72 122/68  Pulse: 100 82 80 70  Temp: 98.6 F (37 C) 98.4 F (36.9 C) 98.5 F (36.9 C) 98.3 F (36.8 C)  TempSrc: Oral Oral Oral Oral  Resp: 18 18 16 16   Height: 6\' 3"  (1.905 m)     Weight: 111 kg (244 lb 11.4 oz)   113.2 kg (249 lb 9 oz)  SpO2: 100% 99% 98% 99%    Intake/Output Summary (Last 24 hours) at 06/28/14 0648 Last data filed at 06/27/14 1723  Gross per 24 hour  Intake   2060 ml  Output    400 ml  Net   1660 ml    Exam:   General:  Pt is alert, follows commands appropriately, not in acute distress  Cardiovascular: Regular rate and rhythm, S1/S2, no murmurs, no rubs, no gallops  Respiratory: Clear to auscultation bilaterally, no wheezing, no crackles, no rhonchi  Abdomen: Soft, non tender, non distended, bowel sounds present, no guarding  Extremities: No edema, pulses DP and PT palpable bilaterally  Neuro: Grossly nonfocal  Data Reviewed: Basic Metabolic Panel:  Recent Labs Lab 06/27/14 0313 06/28/14 0425  NA 139 140  K 4.4 3.7  CL 109 110  CO2 20* 21*  GLUCOSE 186* 85  BUN 21* 12  CREATININE 1.54* 1.15  CALCIUM 8.6* 8.4*  MG  --  1.5*   Liver Function Tests:  Recent Labs Lab 06/27/14 0313 06/28/14 0425  AST 15 12*  ALT 15* 10*  ALKPHOS 71 62  BILITOT 0.6 0.7  PROT 6.9 5.5*  ALBUMIN 3.9 3.2*    Recent Labs Lab 06/27/14 0313  LIPASE 18*   CBC:  Recent Labs Lab 06/27/14 0313  WBC 8.5  NEUTROABS 6.9  HGB 12.4*  HCT 37.3*  MCV 80.2  PLT 206   CBG:  Recent Labs Lab 06/27/14 0238 06/27/14 0542 06/27/14 1254 06/27/14 1645 06/27/14 2126  GLUCAP 169* 129* 103* 107* 82   Scheduled Meds: . amLODipine  10 mg Oral q morning - 10a  . FLUoxetine  10 mg Oral Daily  . heparin  5,000 Units Subcutaneous 3 times per day  . insulin aspart  0-5 Units Subcutaneous QHS  .  insulin aspart  0-9 Units Subcutaneous TID WC  . insulin glargine  8 Units Subcutaneous BID  . metoCLOPramide (REGLAN) injection  10 mg Intravenous TID AC & HS  . metoprolol  50 mg Oral BID  . pantoprazole (PROTONIX) IV  40 mg Intravenous Q12H  . sucralfate  1 g Oral TID WC   Continuous Infusions: . sodium chloride 1,000 mL (06/28/14 0059)

## 2014-06-29 ENCOUNTER — Inpatient Hospital Stay (HOSPITAL_COMMUNITY): Payer: Medicaid Other

## 2014-06-29 LAB — BASIC METABOLIC PANEL
Anion gap: 11 (ref 5–15)
BUN: 10 mg/dL (ref 6–20)
CHLORIDE: 106 mmol/L (ref 101–111)
CO2: 21 mmol/L — ABNORMAL LOW (ref 22–32)
Calcium: 8.5 mg/dL — ABNORMAL LOW (ref 8.9–10.3)
Creatinine, Ser: 1.16 mg/dL (ref 0.61–1.24)
GFR calc non Af Amer: >60 mL/min (ref >60)
Glucose, Bld: 138 mg/dL — ABNORMAL HIGH (ref 65–99)
Potassium: 4.2 mmol/L (ref 3.5–5.1)
SODIUM: 138 mmol/L (ref 135–145)

## 2014-06-29 LAB — GLUCOSE, CAPILLARY
GLUCOSE-CAPILLARY: 170 mg/dL — AB (ref 65–99)
GLUCOSE-CAPILLARY: 99 mg/dL (ref 65–99)
GLUCOSE-CAPILLARY: 85 mg/dL (ref 65–99)
Glucose-Capillary: 125 mg/dL — ABNORMAL HIGH (ref 65–99)

## 2014-06-29 LAB — MAGNESIUM: Magnesium: 1.7 mg/dL (ref 1.7–2.4)

## 2014-06-29 MED ORDER — IOHEXOL 300 MG/ML  SOLN
25.0000 mL | INTRAMUSCULAR | Status: AC
Start: 2014-06-29 — End: 2014-06-29

## 2014-06-29 MED ORDER — SODIUM CHLORIDE 0.9 % IJ SOLN: 10.0000 mL | INTRAMUSCULAR | Status: DC | PRN

## 2014-06-29 MED ORDER — PROMETHAZINE HCL 25 MG/ML IJ SOLN
12.5000 mg | INTRAMUSCULAR | Status: DC | PRN
  Administered 2014-07-01: 25 mg via INTRAVENOUS
  Filled 2014-06-29: qty 1

## 2014-06-29 MED ORDER — SODIUM CHLORIDE 0.9 % IJ SOLN
10.0000 mL | Freq: Two times a day (BID) | INTRAMUSCULAR | Status: DC
  Administered 2014-06-29 – 2014-07-01 (×3): 10 mL

## 2014-06-29 MED ORDER — HYDROMORPHONE HCL 1 MG/ML IJ SOLN
1.0000 mg | INTRAMUSCULAR | Status: DC | PRN
  Administered 2014-06-29 – 2014-06-30 (×4): 1 mg via INTRAVENOUS
  Filled 2014-06-29 (×4): qty 1

## 2014-06-29 MED ORDER — LORAZEPAM 2 MG/ML IJ SOLN
0.5000 mg | Freq: Three times a day (TID) | INTRAMUSCULAR | Status: DC | PRN
  Administered 2014-06-29: 0.5 mg via INTRAVENOUS
  Filled 2014-06-29: qty 1

## 2014-06-29 MED ORDER — PROMETHAZINE HCL 25 MG/ML IJ SOLN
12.5000 mg | Freq: Four times a day (QID) | INTRAMUSCULAR | Status: DC | PRN
  Administered 2014-06-29: 12.5 mg via INTRAVENOUS
  Filled 2014-06-29: qty 1

## 2014-06-29 MED ORDER — IOHEXOL 300 MG/ML  SOLN
100.0000 mL | Freq: Once | INTRAMUSCULAR | Status: AC | PRN
  Administered 2014-06-29: 100 mL via INTRAVENOUS

## 2014-06-29 NOTE — Progress Notes (Signed)
Patient declined NG placement, I explained that it would help control his vomiting, but he declined again. The Hospitalist was called back and informed of his refusal. Will inform oncoming RN about overnight events.

## 2014-06-29 NOTE — Progress Notes (Addendum)
Patient ID: Duane [name], male   DOB: 02-Jan-1976, 39 y.o.   MRN: [id number] [facility] PROGRESS NOTE  Callahan Mondry [id number] DOB: 03-Mar-1975 DOA: 06/27/2014 PCP: [name], MD   Brief narrative:    39 y.o. male, history of poorly controlled DM-1, gastroparesis, obesity, diabetic neuropathy, GERD, gastritis, depression who supposed to be following with [facility]ares[facility] specialist but has not been there yet, was admitted one month ago for diabetic gastroparesis, now presented with 2 day history of nausea, vomiting which started after a brief episode of diarrhea caused by gastroenteritis.   Assessment/Plan:    Principal Problem:   Type I diabetes mellitus with complication, gastroparesis and neuropathies - continue long acting insulin with addition of SSI - readjust the regimen as clinically indicated  Active Problems:   Persistent nausea and vomiting possibly secondary to gastriparesis  - multiple episodes overnight - KUB unremarkable, CT abd requested - pt refusing NGT - provide antiemetics as needed - keep NPO  - continue reglan    Diabetic neuropathy - bothering him this AM - continue Gabapentin    Essential hypertension - reasonable control - continue Norvasc and Metoprolol   Acute renal failure - pre renal etiology - IVF have been provided, BMP pending this AM    Hypomagnesemia - supplement via IV, Mg level pending this AM    GERD (gastroesophageal reflux disease) - continue protonix    Obesity  - Body mass index is 30.59 kg/(m^2).   DVT prophylaxis - Heparin SQ  Code Status: Full.  Family Communication:  plan of care discussed with the patient Disposition Plan: Barrier to d/c -  Still with persistent vomiting, NPO status, refusing NGT  IV access:  Peripheral IV  Procedures and diagnostic studies:    Dg Abd Portable 06/27/2014   No abdominal abnormality by radiograph.    Medical Consultants:  None   Other Consultants:  None    IAnti-Infectives:   None   Faye Ramsay, MD  [facility] Pager [phone]  If 7PM-7AM, please contact night-coverage [email or web address] Password [email or web address] 06/29/2014, 6:54 AM   LOS: 2 days   HPI/Subjective: No events overnight. Vomiting overnight.   Objective: Filed Vitals:   06/28/14 0515 06/28/14 1419 06/28/14 2122 06/29/14 0500  BP: 122/68 150/76 162/88 162/80  Pulse: 70 71 96 110  Temp: 98.3 F (36.8 C) 98.3 F (36.8 C) 98.8 F (37.1 C) 98.2 F (36.8 C)  TempSrc: Oral Oral Oral Oral  Resp: 16  20 20   Height:      Weight: 113.2 kg (249 lb 9 oz)   111 kg (244 lb 11.4 oz)  SpO2: 99% 100% 98% 100%    Intake/Output Summary (Last 24 hours) at 06/29/14 0654 Last data filed at 06/29/14 0456  Gross per 24 hour  Intake    480 ml  Output    800 ml  Net   -320 ml    Exam:   General:  Pt is alert, follows commands appropriately, not in acute distress  Cardiovascular: Regular rhythm, tachycardic, S1/S2, no murmurs, no rubs, no gallops  Respiratory: Clear to auscultation bilaterally, no wheezing, no crackles, no rhonchi  Abdomen: Soft, tender in epigastric area, non distended, bowel sounds present, no guarding  Extremities: No edema, pulses DP and PT palpable bilaterally  Neuro: Grossly nonfocal  Data Reviewed: Basic Metabolic Panel:  Recent Labs Lab 06/27/14 0313 06/28/14 0425  NA 139 140  K 4.4 3.7  CL 109 110  CO2 20* 21*  GLUCOSE 186* 85  BUN 21* 12  CREATININE 1.54* 1.15  CALCIUM 8.6* 8.4*  MG  --  1.5*   Liver Function Tests:  Recent Labs Lab 06/27/14 0313 06/28/14 0425  AST 15 12*  ALT 15* 10*  ALKPHOS 71 62  BILITOT 0.6 0.7  PROT 6.9 5.5*  ALBUMIN 3.9 3.2*    Recent Labs Lab 06/27/14 0313  LIPASE 18*   CBC:  Recent Labs Lab 06/27/14 0313 06/28/14 0730  WBC 8.5 5.3  NEUTROABS 6.9  --   HGB 12.4* 11.7*  HCT 37.3* 35.5*  MCV 80.2 81.2  PLT 206 211   CBG:  Recent Labs Lab 06/27/14 2126 06/28/14 0816 06/28/14 1253  06/28/14 1738 06/28/14 2129  GLUCAP 82 115* 85 126* 112*   Scheduled Meds: . amLODipine  10 mg Oral q morning - 10a  . FLUoxetine  10 mg Oral Daily  . heparin  5,000 Units Subcutaneous 3 times per day  . insulin aspart  0-5 Units Subcutaneous QHS  . insulin aspart  0-9 Units Subcutaneous TID WC  . insulin glargine  8 Units Subcutaneous BID  . metoCLOPramide (REGLAN) injection  10 mg Intravenous TID AC & HS  . metoprolol  50 mg Oral BID  . pantoprazole (PROTONIX) IV  40 mg Intravenous Q12H  . sucralfate  1 g Oral TID WC   Continuous Infusions: . sodium chloride 75 mL/hr at 06/28/14 1951

## 2014-06-29 NOTE — Progress Notes (Signed)
The patient has been very ill during the night. He is vomiting frequently with small rest breaks in between for a total of 800 cc's emesis since midnight. He requested IV phenergan and IV pain meds and the hospitalist on call was paged and made aware of above and orders were received for IV phenergan, and 2 doses of IV morphine. He has received both doses of morphine and is requesting more doses at this time. I will page on call hospitalist and make them aware of patients request.

## 2014-06-29 NOTE — Progress Notes (Signed)
Peripherally Inserted Central Catheter/Midline Placement  The IV Nurse has discussed with the patient and/or persons authorized to consent for the patient, the purpose of this procedure and the potential benefits and risks involved with this procedure.  The benefits include less needle sticks, lab draws from the catheter and patient may be discharged home with the catheter.  Risks include, but not limited to, infection, bleeding, blood clot (thrombus formation), and puncture of an artery; nerve damage and irregular heat beat.  Alternatives to this procedure were also discussed.  PICC/Midline Placement Documentation  PICC / Midline Double Lumen 06/29/14 PICC Right Brachial 48 cm 1 cm (Active)  Indication for Insertion or Continuance of Line Poor Vasculature-patient has had multiple peripheral attempts or PIVs lasting less than 24 hours 06/29/2014  6:00 PM  Exposed Catheter (cm) 1 cm 06/29/2014  6:00 PM  Dressing Change Due 07/06/14 06/29/2014  6:00 PM       Jule Economy Horton 06/29/2014, 6:22 PM

## 2014-06-30 DIAGNOSIS — E1143 Type 2 diabetes mellitus with diabetic autonomic (poly)neuropathy: Secondary | ICD-10-CM

## 2014-06-30 DIAGNOSIS — E876 Hypokalemia: Secondary | ICD-10-CM

## 2014-06-30 DIAGNOSIS — E1069 Type 1 diabetes mellitus with other specified complication: Secondary | ICD-10-CM

## 2014-06-30 DIAGNOSIS — I10 Essential (primary) hypertension: Secondary | ICD-10-CM

## 2014-06-30 LAB — CBC
HCT: 34.3 % — ABNORMAL LOW (ref 39.0–52.0)
Hemoglobin: 11.1 g/dL — ABNORMAL LOW (ref 13.0–17.0)
MCH: 25.8 pg — ABNORMAL LOW (ref 26.0–34.0)
MCHC: 32.4 g/dL (ref 30.0–36.0)
MCV: 79.8 fL (ref 78.0–100.0)
Platelets: 205 10*3/uL (ref 150–400)
RBC: 4.3 MIL/uL (ref 4.22–5.81)
RDW: 12.4 % (ref 11.5–15.5)
WBC: 6.8 10*3/uL (ref 4.0–10.5)

## 2014-06-30 LAB — BASIC METABOLIC PANEL
Anion gap: 7 (ref 5–15)
BUN: 10 mg/dL (ref 6–20)
CO2: 26 mmol/L (ref 22–32)
Calcium: 8.7 mg/dL — ABNORMAL LOW (ref 8.9–10.3)
Chloride: 107 mmol/L (ref 101–111)
Creatinine, Ser: 1.24 mg/dL (ref 0.61–1.24)
GFR calc Af Amer: >60 mL/min (ref >60)
GFR calc non Af Amer: >60 mL/min (ref >60)
Glucose, Bld: 97 mg/dL (ref 65–99)
Potassium: 3.4 mmol/L — ABNORMAL LOW (ref 3.5–5.1)
Sodium: 140 mmol/L (ref 135–145)

## 2014-06-30 LAB — GLUCOSE, CAPILLARY
GLUCOSE-CAPILLARY: 112 mg/dL — AB (ref 65–99)
Glucose-Capillary: 82 mg/dL (ref 65–99)
Glucose-Capillary: 84 mg/dL (ref 65–99)

## 2014-06-30 LAB — MAGNESIUM: MAGNESIUM: 1.7 mg/dL (ref 1.7–2.4)

## 2014-06-30 MED ORDER — POTASSIUM CHLORIDE CRYS ER 20 MEQ PO TBCR
40.0000 meq | EXTENDED_RELEASE_TABLET | Freq: Once | ORAL | Status: AC
  Administered 2014-06-30: 40 meq via ORAL
  Filled 2014-06-30: qty 2

## 2014-06-30 MED ORDER — HYDROMORPHONE HCL 1 MG/ML IJ SOLN
0.5000 mg | INTRAMUSCULAR | Status: DC | PRN
  Administered 2014-07-01 (×3): 0.5 mg via INTRAVENOUS
  Filled 2014-06-30 (×3): qty 1

## 2014-06-30 MED ORDER — INSULIN GLARGINE 100 UNIT/ML ~~LOC~~ SOLN
5.0000 [IU] | Freq: Two times a day (BID) | SUBCUTANEOUS | Status: DC
  Administered 2014-06-30 – 2014-07-01 (×2): 5 [IU] via SUBCUTANEOUS
  Filled 2014-06-30 (×3): qty 0.05

## 2014-06-30 MED ORDER — ACETAMINOPHEN 325 MG PO TABS: 650.0000 mg | ORAL_TABLET | Freq: Four times a day (QID) | ORAL | Status: DC | PRN

## 2014-06-30 NOTE — Progress Notes (Signed)
PROGRESS NOTE    Duane [name] [id number] DOB: 1975/09/09 DOA: 06/27/2014 PCP: [name], MD  HPI/Brief narrative 39 y.o. male, history of poorly controlled DM-1, gastroparesis, obesity, diabetic neuropathy, GERD, gastritis, depression who supposed to be following with [facility] gastroparesis specialist but has not been there yet, was admitted one month ago for diabetic gastroparesis, now presented with 2 day history of nausea, vomiting which started after a brief episode of diarrhea caused by gastroenteritis.    Assessment/Plan:  Principal Problem:  Type I diabetes mellitus with complication, gastroparesis and neuropathies - continue long acting insulin with addition of SSI - readjust the regimen as clinically indicated  - CBGs tightly controlled. Monitor closely  Active Problems:  Persistent nausea and vomiting possibly secondary to gastriparesis  - KUB unremarkable, CT abd requested - pt refusing NGT - Patient treated supportively with bowel rest/nothing by mouth, IV fluids, antiemetics and Reglan - Clinically improved. Advance diet gradually as tolerated.   Diabetic neuropathy - continue Gabapentin     Essential hypertension - reasonable control - continue Norvasc and Metoprolol   Acute renal failure - pre renal etiology - Resolved after IV fluids   Hypomagnesemia/hypokalemia - Replace as needed   GERD (gastroesophageal reflux disease) - continue protonix     Obesity  - Body mass index is 30.59 kg/(m^2).   Anemia - Follow CBCs   Code Status: Full Family Communication: None at bedside Disposition Plan: DC home possibly 5/19   Consultants:  None  Procedures:  None  Antibiotics:  None   Subjective: Feels better. No nausea or vomiting since yesterday evening. Tolerated clears and advancing to full liquids this p.m. No abdominal pain. Last BM 3 days ago.  Objective: Filed Vitals:   06/30/14 0555 06/30/14 0700  06/30/14 1009 06/30/14 1435  BP: 117/56  155/84 132/71  Pulse: 72   69  Temp: 97.8 F (36.6 C)   97.7 F (36.5 C)  TempSrc: Oral   Oral  Resp: 20   18  Height:      Weight:  113.4 kg (250 lb)    SpO2: 97%   97%    Intake/Output Summary (Last 24 hours) at 06/30/14 1830 Last data filed at 06/30/14 1420  Gross per 24 hour  Intake 1907.25 ml  Output      0 ml  Net 1907.25 ml   Filed Weights   06/28/14 0515 06/29/14 0500 06/30/14 0700  Weight: 113.2 kg (249 lb 9 oz) 111 kg (244 lb 11.4 oz) 113.4 kg (250 lb)     Exam:  General exam: Pleasant young male sitting up comfortably in bed Respiratory system: Clear. No increased work of breathing. Cardiovascular system: S1 & S2 heard, RRR. No JVD, murmurs, gallops, clicks or pedal edema. Gastrointestinal system: Abdomen is nondistended, soft and nontender. Normal bowel sounds heard. Central nervous system: Alert and oriented. No focal neurological deficits. Extremities: Symmetric 5 x 5 power.   Data Reviewed: Basic Metabolic Panel:  Recent Labs Lab 06/27/14 0313 06/28/14 0425 06/29/14 1200 06/30/14 0555  NA 139 140 138 140  K 4.4 3.7 4.2 3.4*  CL 109 110 106 107  CO2 20* 21* 21* 26  GLUCOSE 186* 85 138* 97  BUN 21* 12 10 10   CREATININE 1.54* 1.15 1.16 1.24  CALCIUM 8.6* 8.4* 8.5* 8.7*  MG  --  1.5* 1.7 1.7   Liver Function Tests:  Recent Labs Lab 06/27/14 0313 06/28/14 0425  AST 15 12*  ALT 15* 10*  ALKPHOS 71  62  BILITOT 0.6 0.7  PROT 6.9 5.5*  ALBUMIN 3.9 3.2*    Recent Labs Lab 06/27/14 0313  LIPASE 18*   No results for input(s): AMMONIA in the last 168 hours. CBC:  Recent Labs Lab 06/27/14 0313 06/28/14 0730 06/30/14 0555  WBC 8.5 5.3 6.8  NEUTROABS 6.9  --   --   HGB 12.4* 11.7* 11.1*  HCT 37.3* 35.5* 34.3*  MCV 80.2 81.2 79.8  PLT 206 211 205   Cardiac Enzymes: No results for input(s): CKTOTAL, CKMB, CKMBINDEX, TROPONINI in the last 168 hours. BNP (last 3 results) No results for  input(s): PROBNP in the last 8760 hours. CBG:  Recent Labs Lab 06/29/14 1705 06/29/14 2138 06/30/14 0730 06/30/14 1133 06/30/14 1649  GLUCAP 99 85 82 84 112*    No results found for this or any previous visit (from the past 240 hour(s)).        Studies: Ct Abdomen Pelvis W Contrast  06/29/2014   CLINICAL DATA:  Nausea and vomiting for the past 2 days. Gastroparesis.  EXAM: CT ABDOMEN AND PELVIS WITH CONTRAST  TECHNIQUE: Multidetector CT imaging of the abdomen and pelvis was performed using the standard protocol following bolus administration of intravenous contrast.  CONTRAST:  182mL OMNIPAQUE IOHEXOL 300 MG/ML  SOLN  COMPARISON:  06/07/2013  FINDINGS: There is a lack of visible intravascular contrast due to the fact that only approximately 30 cc of the contrast was administered before in the IV infiltrated. There is excreted contrast in the renal collecting systems, ureters and urinary bladder.  Cholecystectomy clips. The previously demonstrated 1 cm oval area of low density in the right lobe of the liver is not currently visualized. The remainder of the liver has a normal appearance, as do the spleen, pancreas, adrenal glands, kidneys, ureters, urinary bladder and prostate gland. No gastrointestinal abnormalities or enlarged lymph nodes. Normal appearing appendix. Minimal dependent atelectasis at both lung bases. Unremarkable bones.  IMPRESSION: No acute abnormality. The previously seen 1 cm oval area of low density in the right lobe of the liver is no longer seen, most likely indicating a small hemangioma which has equilibrated on the current examination.   Electronically Signed   By: Claudie Revering M.D.   On: 06/29/2014 17:12   Dg Abd Portable 1v  06/29/2014   CLINICAL DATA:  39 year old male with a history of intractable nausea and vomiting.  EXAM: PORTABLE ABDOMEN - 1 VIEW  COMPARISON:  06/27/2014  FINDINGS: Motion artifact limits evaluation of the current study.  Gas is evident within  central small bowel and colon. No abnormally distended small bowel loops or colonic loops.  Surgical changes of prior cholecystectomy.  No displaced fracture.  IMPRESSION: Limited plain film given the respiratory motion, with unremarkable bowel gas pattern. If there is concern for acute intra-abdominal process, CT may be considered.  Signed,  Dulcy Fanny. Earleen Newport, DO  [facility]  [facility]   Electronically Signed   By: Corrie Mckusick D.O.   On: 06/29/2014 09:11        Scheduled Meds: . amLODipine  10 mg Oral q morning - 10a  . FLUoxetine  10 mg Oral Daily  . heparin  5,000 Units Subcutaneous 3 times per day  . insulin aspart  0-5 Units Subcutaneous QHS  . insulin aspart  0-9 Units Subcutaneous TID WC  . insulin glargine  8 Units Subcutaneous BID  . metoCLOPramide (REGLAN) injection  10 mg Intravenous TID AC & HS  .  metoprolol  50 mg Oral BID  . pantoprazole (PROTONIX) IV  40 mg Intravenous Q12H  . sodium chloride  10-40 mL Intracatheter Q12H  . sucralfate  1 g Oral TID WC   Continuous Infusions: . sodium chloride 1,000 mL (06/30/14 1014)    Principal Problem:   Type I diabetes mellitus with complication, uncontrolled Active Problems:   Diabetic gastroparesis   Diabetic neuropathy   Essential hypertension   GERD (gastroesophageal reflux disease)   Intractable cyclical vomiting with nausea   Nausea and vomiting    Time spent: 20 minutes.    Vernell Leep, MD, FACP, FHM. [facility] Pager [phone]  If 7PM-7AM, please contact night-coverage [email or web address] Password [email or web address] 06/30/2014, 6:30 PM    LOS: 3 days

## 2014-06-30 NOTE — Progress Notes (Signed)
Patient tolerated clear liquids, no c/o n/v,he requested if diet can be advanced,called Dr. Algis Liming and ordered full liquids.MD also mentioned if patient tolerates the diet then can advanced  to soft diet in am. Will continue to monitor.

## 2014-07-01 DIAGNOSIS — N179 Acute kidney failure, unspecified: Secondary | ICD-10-CM

## 2014-07-01 LAB — BASIC METABOLIC PANEL
Anion gap: 6 (ref 5–15)
BUN: 8 mg/dL (ref 6–20)
CO2: 28 mmol/L (ref 22–32)
Calcium: 8.4 mg/dL — ABNORMAL LOW (ref 8.9–10.3)
Chloride: 105 mmol/L (ref 101–111)
Creatinine, Ser: 1.08 mg/dL (ref 0.61–1.24)
GFR calc Af Amer: >60 mL/min (ref >60)
GFR calc non Af Amer: >60 mL/min (ref >60)
Glucose, Bld: 99 mg/dL (ref 65–99)
Potassium: 3.4 mmol/L — ABNORMAL LOW (ref 3.5–5.1)
Sodium: 139 mmol/L (ref 135–145)

## 2014-07-01 LAB — GLUCOSE, CAPILLARY
GLUCOSE-CAPILLARY: 74 mg/dL (ref 65–99)
Glucose-Capillary: 163 mg/dL — ABNORMAL HIGH (ref 65–99)
Glucose-Capillary: 164 mg/dL — ABNORMAL HIGH (ref 65–99)
Glucose-Capillary: 114 mg/dL — ABNORMAL HIGH (ref 65–99)

## 2014-07-01 LAB — CBC
HCT: 34.4 % — ABNORMAL LOW (ref 39.0–52.0)
Hemoglobin: 11.2 g/dL — ABNORMAL LOW (ref 13.0–17.0)
MCH: 25.8 pg — ABNORMAL LOW (ref 26.0–34.0)
MCHC: 32.6 g/dL (ref 30.0–36.0)
MCV: 79.3 fL (ref 78.0–100.0)
PLATELETS: 192 10*3/uL (ref 150–400)
RBC: 4.34 MIL/uL (ref 4.22–5.81)
RDW: 12.3 % (ref 11.5–15.5)
WBC: 5.7 10*3/uL (ref 4.0–10.5)

## 2014-07-01 MED ORDER — POTASSIUM CHLORIDE CRYS ER 20 MEQ PO TBCR
40.0000 meq | EXTENDED_RELEASE_TABLET | Freq: Once | ORAL | Status: AC
  Administered 2014-07-01: 40 meq via ORAL
  Filled 2014-07-01: qty 2

## 2014-07-01 NOTE — Discharge Summary (Addendum)
Physician Discharge Summary  Duane [name] [id number] DOB: 09/04/1975 DOA: 06/27/2014  PCP: No primary care provider on file.  Admit date: 06/27/2014 Discharge date: 07/01/2014  Time spent: Greater than 30 minutes  Recommendations for Outpatient Follow-up:  1. PCP at [facility] and [place] on 07/07/14 at 10 AM with repeat labs (CBC & BMP) 2. Patient advised to keep previous appointment and follow-up with GI clinic at [facility] related to his gastroparesis.  Discharge Diagnoses:  Principal Problem:   Type I diabetes mellitus with complication, uncontrolled Active Problems:   Diabetic gastroparesis   Diabetic neuropathy   Essential hypertension   GERD (gastroesophageal reflux disease)   Intractable cyclical vomiting with nausea   Nausea and vomiting   Discharge Condition: Improved & Stable  Diet recommendation: Heart healthy and diabetic diet.  Filed Weights   06/29/14 0500 06/30/14 0700 07/01/14 0515  Weight: 111 kg (244 lb 11.4 oz) 113.4 kg (250 lb) 122.38 kg (269 lb 12.8 oz)    History of present illness:  39 y.o. male, history of poorly controlled DM-1, gastroparesis, obesity, diabetic neuropathy, GERD, gastritis, depression who is supposed to be following with [facility] gastroparesis specialist but has not been there yet, was admitted one month ago for diabetic gastroparesis, now presented with 2 day history of nausea, vomiting which started after a brief episode of diarrhea caused by gastroenteritis.   Hospital Course:   Principal Problem:  Type I diabetes mellitus with complication, gastroparesis and neuropathies - Due to poor oral intake, patient was treated in the hospital with reduced dose of Lantus and NovoLog SSI - Along with his oral intake, his CBGs are gradually starting to rise. Advised patient to resume home regimen with close monitoring of his CBGs and reduce/adjust as needed. He is aware of  such management. - Hemoglobin A1c 5/15:8.3 suggesting poor outpatient control and will need close monitoring and management  Active Problems:  Persistent nausea and vomiting possibly secondary to gastriparesis  - KUB unremarkable, CT abd-without acute findings - Patient treated supportively with bowel rest/nothing by mouth, IV fluids, antiemetics and Reglan - Clinically improved.  - Patient's diet was gradually advanced which he has tolerated - Advised patient to have small frequent meals and stay on soft diet for a couple of days.   Diabetic neuropathy - continue Gabapentin    Essential hypertension - reasonable control - continue Norvasc and Metoprolol   Acute renal failure - pre renal etiology - Resolved after IV fluids   Hypomagnesemia/hypokalemia - Replaced prior to discharge.   GERD (gastroesophageal reflux disease) - continue protonix    Obesity  - Body mass index is 30.59 kg/(m^2).   Anemia - Stable   Consultations:  None   Procedures:  Right upper arm PICC line-DC'd   Discharge Exam:  Complaints:  Continues to feel better. Tolerated soft diet with mild intermittent nausea but no vomiting. Had a loose BM this morning after not having BM for 4 days. No abdominal pain. Denies any other complaints.   Filed Vitals:   06/30/14 1435 06/30/14 2107 07/01/14 0515 07/01/14 1340  BP: 132/71 125/71 131/67 136/86  Pulse: 69 72 68 97  Temp: 97.7 F (36.5 C) 97.6 F (36.4 C) 97.5 F (36.4 C) 98.1 F (36.7 C)  TempSrc: Oral Oral Oral Oral  Resp: 18 18 18 20   Height:      Weight:   122.38 kg (269 lb 12.8 oz)   SpO2: 97% 100% 98% 96%  General exam: Pleasant young male seen ambulating comfortably in the halls. Respiratory system: Clear. No increased work of breathing. Cardiovascular system: S1 & S2 heard, RRR. No JVD, murmurs, gallops, clicks or pedal edema. Gastrointestinal system: Abdomen is nondistended, soft and nontender. Normal bowel  sounds heard. Central nervous system: Alert and oriented. No focal neurological deficits. Extremities: Symmetric 5 x 5 power.  Discharge Instructions      Discharge Instructions    Call MD for:  extreme fatigue    Complete by:  As directed      Call MD for:  persistant dizziness or light-headedness    Complete by:  As directed      Call MD for:  persistant nausea and vomiting    Complete by:  As directed      Call MD for:  severe uncontrolled pain    Complete by:  As directed      Diet - low sodium heart healthy    Complete by:  As directed      Diet Carb Modified    Complete by:  As directed      Increase activity slowly    Complete by:  As directed             Medication List    STOP taking these medications        feeding supplement (GLUCERNA SHAKE) Liqd     FLUoxetine 10 MG capsule  Commonly known as:  PROZAC     LORazepam 0.5 MG tablet  Commonly known as:  ATIVAN     ondansetron 4 MG disintegrating tablet  Commonly known as:  ZOFRAN ODT     traZODone 50 MG tablet  Commonly known as:  DESYREL      TAKE these medications        amLODipine 10 MG tablet  Commonly known as:  NORVASC  Take 1 tablet (10 mg total) by mouth every morning.     gabapentin 300 MG capsule  Commonly known as:  NEURONTIN  Take 1 capsule (300 mg total) by mouth at bedtime.     glucose blood test strip  Commonly known as:  ONE TOUCH ULTRA TEST  Use as instructed to check blood sugar once a day dx code 250.00     insulin aspart cartridge  Commonly known as:  NOVOLOG  Inject 4 Units into the skin 3 (three) times daily with meals.     insulin glargine 100 UNIT/ML injection  Commonly known as:  LANTUS  Inject 0.18 mLs (18 Units total) into the skin at bedtime.     Insulin Pen Needle 31G X 8 MM Misc  Check blood sugar TID and QHS     metoCLOPramide 10 MG tablet  Commonly known as:  REGLAN  Take 1 tablet (10 mg total) by mouth 4 (four) times daily -  before meals and at bedtime.  For 1 week then as needed     metoprolol 50 MG tablet  Commonly known as:  LOPRESSOR  Take 1 tablet (50 mg total) by mouth 2 (two) times daily.     onetouch ultrasoft lancets  Use as instructed     pantoprazole 40 MG tablet  Commonly known as:  PROTONIX  Take 1 tablet (40 mg total) by mouth 2 (two) times daily.     promethazine 25 MG tablet  Commonly known as:  PHENERGAN  Take 1 tablet (25 mg total) by mouth every 8 (eight) hours as needed for nausea or vomiting.  sucralfate 1 G tablet  Commonly known as:  CARAFATE  Take 1 tablet (1 g total) by mouth 3 (three) times daily with meals.       Follow-up Information    Follow up with [facility]    . Go on 07/07/2014.   Why:  at 10am for CBC and BMP lab draws and hospital follow up appointment.   Contact information:   [address] [place] [phone]       The results of significant diagnostics from this hospitalization (including imaging, microbiology, ancillary and laboratory) are listed below for reference.    Significant Diagnostic Studies: Ct Abdomen Pelvis W Contrast  06/29/2014   CLINICAL DATA:  Nausea and vomiting for the past 2 days. Gastroparesis.  EXAM: CT ABDOMEN AND PELVIS WITH CONTRAST  TECHNIQUE: Multidetector CT imaging of the abdomen and pelvis was performed using the standard protocol following bolus administration of intravenous contrast.  CONTRAST:  137mL OMNIPAQUE IOHEXOL 300 MG/ML  SOLN  COMPARISON:  06/07/2013  FINDINGS: There is a lack of visible intravascular contrast due to the fact that only approximately 30 cc of the contrast was administered before in the IV infiltrated. There is excreted contrast in the renal collecting systems, ureters and urinary bladder.  Cholecystectomy clips. The previously demonstrated 1 cm oval area of low density in the right lobe of the liver is not currently visualized. The remainder of the liver has a normal  appearance, as do the spleen, pancreas, adrenal glands, kidneys, ureters, urinary bladder and prostate gland. No gastrointestinal abnormalities or enlarged lymph nodes. Normal appearing appendix. Minimal dependent atelectasis at both lung bases. Unremarkable bones.  IMPRESSION: No acute abnormality. The previously seen 1 cm oval area of low density in the right lobe of the liver is no longer seen, most likely indicating a small hemangioma which has equilibrated on the current examination.   Electronically Signed   By: Claudie Revering M.D.   On: 06/29/2014 17:12   Dg Abd Portable 1v  06/29/2014   CLINICAL DATA:  39 year old male with a history of intractable nausea and vomiting.  EXAM: PORTABLE ABDOMEN - 1 VIEW  COMPARISON:  06/27/2014  FINDINGS: Motion artifact limits evaluation of the current study.  Gas is evident within central small bowel and colon. No abnormally distended small bowel loops or colonic loops.  Surgical changes of prior cholecystectomy.  No displaced fracture.  IMPRESSION: Limited plain film given the respiratory motion, with unremarkable bowel gas pattern. If there is concern for acute intra-abdominal process, CT may be considered.  Signed,  Dulcy Fanny. Earleen Newport, DO  [facility]  [facility]   Electronically Signed   By: Corrie Mckusick D.O.   On: 06/29/2014 09:11   Dg Abd Portable 1v  06/27/2014   CLINICAL DATA:  Nausea/vomiting/diarrhea and chest pain present today  EXAM: PORTABLE ABDOMEN - 1 VIEW  COMPARISON:  04/30/2014  FINDINGS: There no dilated loops of large or small bowel. Gas and stool in the rectum. No pathologic calcifications. No organomegaly. Postcholecystectomy.  IMPRESSION: No abdominal abnormality by radiograph.   Electronically Signed   By: Suzy Bouchard M.D.   On: 06/27/2014 11:49    Microbiology: No results found for this or any previous visit (from the past 240 hour(s)).   Labs: Basic Metabolic Panel:  Recent Labs Lab  06/27/14 0313 06/28/14 0425 06/29/14 1200 06/30/14 0555 07/01/14 0545  NA 139 140 138 140 139  K 4.4  3.7 4.2 3.4* 3.4*  CL 109 110 106 107 105  CO2 20* 21* 21* 26 28  GLUCOSE 186* 85 138* 97 99  BUN 21* 12 10 10 8   CREATININE 1.54* 1.15 1.16 1.24 1.08  CALCIUM 8.6* 8.4* 8.5* 8.7* 8.4*  MG  --  1.5* 1.7 1.7  --    Liver Function Tests:  Recent Labs Lab 06/27/14 0313 06/28/14 0425  AST 15 12*  ALT 15* 10*  ALKPHOS 71 62  BILITOT 0.6 0.7  PROT 6.9 5.5*  ALBUMIN 3.9 3.2*    Recent Labs Lab 06/27/14 0313  LIPASE 18*   No results for input(s): AMMONIA in the last 168 hours. CBC:  Recent Labs Lab 06/27/14 0313 06/28/14 0730 06/30/14 0555 07/01/14 0545  WBC 8.5 5.3 6.8 5.7  NEUTROABS 6.9  --   --   --   HGB 12.4* 11.7* 11.1* 11.2*  HCT 37.3* 35.5* 34.3* 34.4*  MCV 80.2 81.2 79.8 79.3  PLT 206 211 205 192   Cardiac Enzymes: No results for input(s): CKTOTAL, CKMB, CKMBINDEX, TROPONINI in the last 168 hours. BNP: BNP (last 3 results) No results for input(s): BNP in the last 8760 hours.  ProBNP (last 3 results) No results for input(s): PROBNP in the last 8760 hours.  CBG:  Recent Labs Lab 06/30/14 1649 06/30/14 2110 07/01/14 0758 07/01/14 1222 07/01/14 1628  GLUCAP 112* 163* 74 164* 114*       Signed:  Vernell Leep, MD, FACP, FHM. [facility] Pager [phone]  If 7PM-7AM, please contact night-coverage [email or web address] Password [facility] 07/01/2014, 6:27 PM

## 2014-07-01 NOTE — Care Management Note (Signed)
Case Management Note  Patient Details  Name: Duane [name] MRN: [id number] Date of Birth: 21-Jan-1976  Subjective/Objective:        39 yo admitted with DM             Action/Plan: From home   Expected Discharge Date:                  Expected Discharge Plan:  Home/Self Care  In-House Referral:     Discharge planning Services  CM Consult  Post Acute Care Choice:    Choice offered to:     DME Arranged:    DME Agency:     HH Arranged:    HH Agency:     Status of Service:  In process, will continue to follow  Medicare Important Message Given:    Date Medicare IM Given:    Medicare IM give by:    Date Additional Medicare IM Given:    Additional Medicare Important Message give by:     If discussed at [address] of Stay Meetings, dates discussed:    Additional Comments: This pt is well known to this CM and I have tried multiple times to get him to follow up at the [facility] and [facility]. Again, follow up appointment made for pt and placed on AVS. Pt informed that because he has not shown up to previous two appointments, and if he doesn't show up for this appointment he will no longer be able to be seen at the [facility]. Pt states that he understands and will follow up. Lynnell Catalan, RN 07/01/2014, 11:33 AM

## 2014-07-01 NOTE — Progress Notes (Signed)
Patient had soft diet for breakfast,claimed he had 1 loose stool after eating. Patient instructed to report more episode of diarrhea, had lunch and c/o nausea after eating.

## 2014-07-07 ENCOUNTER — Ambulatory Visit: Payer: Medicaid Other | Attending: Family Medicine | Admitting: Family Medicine

## 2014-07-07 ENCOUNTER — Encounter: Payer: Self-pay | Admitting: Family Medicine

## 2014-07-07 VITALS — BP 114/79 | HR 73 | Temp 98.4°F | Ht 75.0 in | Wt 269.0 lb

## 2014-07-07 DIAGNOSIS — R42 Dizziness and giddiness: Secondary | ICD-10-CM | POA: Diagnosis not present

## 2014-07-07 DIAGNOSIS — K2100 Gastro-esophageal reflux disease with esophagitis, without bleeding: Secondary | ICD-10-CM

## 2014-07-07 DIAGNOSIS — K3184 Gastroparesis: Secondary | ICD-10-CM | POA: Diagnosis not present

## 2014-07-07 DIAGNOSIS — Z6833 Body mass index (BMI) 33.0-33.9, adult: Secondary | ICD-10-CM | POA: Insufficient documentation

## 2014-07-07 DIAGNOSIS — E1065 Type 1 diabetes mellitus with hyperglycemia: Secondary | ICD-10-CM

## 2014-07-07 DIAGNOSIS — E1043 Type 1 diabetes mellitus with diabetic autonomic (poly)neuropathy: Secondary | ICD-10-CM | POA: Diagnosis present

## 2014-07-07 DIAGNOSIS — E1069 Type 1 diabetes mellitus with other specified complication: Secondary | ICD-10-CM

## 2014-07-07 DIAGNOSIS — Z794 Long term (current) use of insulin: Secondary | ICD-10-CM | POA: Diagnosis not present

## 2014-07-07 DIAGNOSIS — Z9049 Acquired absence of other specified parts of digestive tract: Secondary | ICD-10-CM | POA: Diagnosis not present

## 2014-07-07 DIAGNOSIS — I10 Essential (primary) hypertension: Secondary | ICD-10-CM

## 2014-07-07 DIAGNOSIS — R0600 Dyspnea, unspecified: Secondary | ICD-10-CM | POA: Diagnosis not present

## 2014-07-07 DIAGNOSIS — E669 Obesity, unspecified: Secondary | ICD-10-CM | POA: Insufficient documentation

## 2014-07-07 DIAGNOSIS — IMO0002 Reserved for concepts with insufficient information to code with codable children: Secondary | ICD-10-CM

## 2014-07-07 DIAGNOSIS — E108 Type 1 diabetes mellitus with unspecified complications: Secondary | ICD-10-CM

## 2014-07-07 LAB — COMPREHENSIVE METABOLIC PANEL
ALK PHOS: 82 U/L (ref 39–117)
ALT: 17 U/L (ref 0–53)
AST: 13 U/L (ref 0–37)
Albumin: 4.1 g/dL (ref 3.5–5.2)
BUN: 13 mg/dL (ref 6–23)
CO2: 27 mEq/L (ref 19–32)
Calcium: 9.4 mg/dL (ref 8.4–10.5)
Chloride: 102 mEq/L (ref 96–112)
Creat: 1.29 mg/dL (ref 0.50–1.35)
Glucose, Bld: 166 mg/dL — ABNORMAL HIGH (ref 70–99)
POTASSIUM: 4.3 meq/L (ref 3.5–5.3)
Sodium: 139 mEq/L (ref 135–145)
TOTAL PROTEIN: 6.9 g/dL (ref 6.0–8.3)
Total Bilirubin: 0.5 mg/dL (ref 0.2–1.2)

## 2014-07-07 LAB — LIPID PANEL
Cholesterol: 149 mg/dL (ref 0–200)
HDL: 34 mg/dL — AB (ref >=40)
LDL CALC: 94 mg/dL (ref 0–99)
Total CHOL/HDL Ratio: 4.4 Ratio
Triglycerides: 106 mg/dL (ref <150)
VLDL: 21 mg/dL (ref 0–40)

## 2014-07-07 LAB — GLUCOSE, POCT (MANUAL RESULT ENTRY): POC Glucose: 151 mg/dl — AB (ref 70–99)

## 2014-07-07 MED ORDER — METOPROLOL TARTRATE 50 MG PO TABS: 50.0000 mg | ORAL_TABLET | Freq: Two times a day (BID) | ORAL | Status: DC

## 2014-07-07 MED ORDER — LISINOPRIL 2.5 MG PO TABS: 5.0000 mg | ORAL_TABLET | Freq: Every day | ORAL | Status: DC

## 2014-07-07 MED ORDER — PANTOPRAZOLE SODIUM 40 MG PO TBEC: 40.0000 mg | DELAYED_RELEASE_TABLET | Freq: Two times a day (BID) | ORAL | Status: DC

## 2014-07-07 MED ORDER — GABAPENTIN 300 MG PO CAPS: 300.0000 mg | ORAL_CAPSULE | Freq: Every day | ORAL | Status: DC

## 2014-07-07 MED ORDER — METOCLOPRAMIDE HCL 10 MG PO TABS: 10.0000 mg | ORAL_TABLET | Freq: Three times a day (TID) | ORAL | Status: DC

## 2014-07-07 MED ORDER — SUCRALFATE 1 G PO TABS: 1.0000 g | ORAL_TABLET | Freq: Three times a day (TID) | ORAL | Status: DC

## 2014-07-07 MED ORDER — INSULIN ASPART 100 UNIT/ML CARTRIDGE (PENFILL): 4.0000 [IU] | Freq: Three times a day (TID) | SUBCUTANEOUS | Status: DC

## 2014-07-07 MED ORDER — PROMETHAZINE HCL 25 MG PO TABS: 25.0000 mg | ORAL_TABLET | Freq: Three times a day (TID) | ORAL | Status: DC | PRN

## 2014-07-07 MED ORDER — INSULIN GLARGINE 100 UNIT/ML ~~LOC~~ SOLN: 25.0000 [IU] | Freq: Every day | SUBCUTANEOUS | Status: DC

## 2014-07-07 NOTE — Patient Instructions (Signed)
Type 1 Diabetes Mellitus Type 1 diabetes mellitus, often simply referred to as diabetes, is a long-term (chronic) disease. It occurs when the islet cells in the pancreas that make insulin (a hormone) are destroyed and can no longer make insulin. Insulin is needed to move sugars from food into the tissue cells. The tissue cells use the sugars for energy. In people with type 1 diabetes, the sugars build up in the blood instead of going into the tissue cells. As a result, high blood sugar (hyperglycemia) develops. Without insulin, the body breaks down fat cells for the needed energy. This breakdown of fat cells produces acid chemicals (ketones), which increases the acid levels in the body. The effect of either high ketone or high sugar (glucose) levels can be life-threatening.  Type 1 diabetes was also previously called juvenile diabetes. It most often occurs before the age of 11, but it can occur at any age. RISK FACTORS A person is predisposed to developing type 1 diabetes if someone in his or her family has the disease and is exposed to certain additional environmental triggers.  SYMPTOMS  Symptoms of type 1 diabetes may develop gradually over days to weeks or suddenly. The symptoms occur due to hyperglycemia. The symptoms can include:   Increased thirst (polydipsia).  Increased urination (polyuria).  Increased urination during the night (nocturia).  Weight loss. This weight loss may be rapid.  Frequent, recurring infections.  Tiredness (fatigue).  Weakness.  Vision changes, such as blurred vision.  Fruity smell to your breath.  Abdominal pain.  Nausea or vomiting. DIAGNOSIS  Type 1 diabetes is diagnosed when symptoms of diabetes are present and when blood glucose levels are increased. Your blood glucose level may be checked by one or more of the following blood tests:  A fasting blood glucose test. You will not be allowed to eat for at least 8 hours before a blood sample is  taken.  A random blood glucose test. Your blood glucose is checked at any time of the day regardless of when you ate.  A hemoglobin A1c blood glucose test. A hemoglobin A1c test provides information about blood glucose control over the previous 3 months. TREATMENT  Although type 1 diabetes cannot be prevented, it can be managed with insulin, diet, and exercise.  You will need to take insulin daily to keep blood glucose in the desired range.  You will need to match insulin dosing with exercise and healthy food choices. The treatment goal is to maintain the before-meal blood sugar (preprandial glucose) level at 70-130 mg/dL.  HOME CARE INSTRUCTIONS   Have your hemoglobin A1c level checked twice a year.  Perform daily blood glucose monitoring as directed by your health care provider.  Monitor urine ketones when you are ill and as directed by your health care provider.  Take your insulin as directed by your health care provider to maintain your blood glucose level in the desired range.  Never run out of insulin. It is needed every day.  Adjust insulin based on your intake of carbohydrates. Carbohydrates can raise blood glucose levels but need to be included in your diet. Carbohydrates provide vitamins, minerals, and fiber, which are an essential part of a healthy diet. Carbohydrates are found in fruits, vegetables, whole grains, dairy products, legumes, and foods containing added sugars.  Eat healthy foods. Alternate 3 meals with 3 snacks.  Maintain a healthy weight.  Carry a medical alert card or wear your medical alert jewelry.  Carry a 15-gram carbohydrate snack  with you at all times to treat low blood glucose (hypoglycemia). Some examples of 15-gram carbohydrate snacks include:  Glucose tablets, 3 or 4.  Glucose gel, 15-gram tube.  Raisins, 2 tablespoons (24 grams).  Jelly beans, 6.  Animal crackers, 8.  Fruit juice, regular soda, or low-fat milk, 4 ounces (120  mL).  Gummy treats, 9.  Recognize hypoglycemia. Hypoglycemia occurs with blood glucose levels of 70 mg/dL and below. The risk for hypoglycemia increases when fasting or skipping meals, during or after intense exercise, and during sleep. Hypoglycemia symptoms can include:  Tremors or shakes.  Decreased ability to concentrate.  Sweating.  Increased heart rate.  Headache.  Dry mouth.  Hunger.  Irritability.  Anxiety.  Restless sleep.  Altered speech or coordination.  Confusion.  Treat hypoglycemia promptly. If you are alert and able to safely swallow, follow the 15:15 rule:  Take 15-20 grams of rapid-acting glucose or carbohydrate. Rapid-acting options include glucose gel, glucose tablets, or 4 ounces (120 mL) of fruit juice, regular soda, or low-fat milk.  Check your blood glucose level 15 minutes after taking the glucose.  Take 15-20 grams more of glucose if the repeat blood glucose level is still 70 mg/dL or below.  Eat a meal or snack within 1 hour once blood glucose levels return to normal.  Be alert to polyuria and polydipsia, which are early signs of hyperglycemia. An early awareness of hyperglycemia allows for prompt treatment. Treat hyperglycemia as directed by your health care provider.  Exercise regularly as directed by your health care provider. This includes:  Performing resistance training twice a week such as push-ups, sit-ups, lifting weights, or using resistance bands.  Performing 150 minutes of cardio exercises each week such as walking, running, or playing sports.  Staying active and spending no more than 90 minutes at one time being inactive.  Adjust your insulin dosing and food intake as needed if you start a new exercise or sport.  Follow your sick-day plan at any time you are unable to eat or drink as usual.   Do not use any tobacco products including cigarettes, chewing tobacco, or electronic cigarettes. If you need help quitting, ask your  health care provider.  Limit alcohol intake to no more than 1 drink per day for nonpregnant women and 2 drinks per day for men. You should drink alcohol only when you are also eating food. Talk with your health care provider about whether alcohol is safe for you. Tell your health care provider if you drink alcohol several times a week.  Keep all follow-up visits as directed by your health care provider.  Schedule an eye exam within 5 years of diagnosis and then annually.  Perform daily skin and foot care. Examine your skin and feet daily for cuts, bruises, redness, nail problems, bleeding, blisters, or sores. A foot exam by a health care provider should be done annually.  Brush your teeth and gums at least twice a day and floss at least once a day. Follow up with your dentist regularly.  Share your diabetes management plan with your workplace or school.  Stay up-to-date with immunizations. It is recommended that people with diabetes who are over 42 years old get the pneumonia vaccine. In some cases, two separate shots may be given. Ask your health care provider if your pneumonia vaccination is up-to-date.  Learn to manage stress.  Obtain ongoing diabetes education and support as needed.  Participate in or seek rehabilitation as needed to maintain or improve independence  and quality of life. Request a physical or occupational therapy referral if you are having foot or hand numbness, or difficulties with grooming, dressing, eating, or physical activity. SEEK MEDICAL CARE IF:   You are unable to eat food or drink fluids for more than 6 hours.  You have nausea and vomiting for more than 6 hours.  Your blood glucose level is over 240 mg/dL.  There is a change in mental status.  You develop an additional serious illness.  You have diarrhea for more than 6 hours.  You have been sick or have had a fever for a couple of days and are not getting better.  You have pain during any physical  activity. SEEK IMMEDIATE MEDICAL CARE IF:  You have difficulty breathing.  You have moderate to large ketone levels. MAKE SURE YOU:  Understand these instructions.  Will watch your condition.  Will get help right away if you are not doing well or get worse. Document Released: 01/27/2000 Document Revised: 06/15/2013 Document Reviewed: 08/28/2011 [facility] Patient Information 2015 [place], [place]. This information is not intended to replace advice given to you by your health care provider. Make sure you discuss any questions you have with your health care provider.

## 2014-07-07 NOTE — Progress Notes (Signed)
Patient here for second time.  He saw Zettie Pho in March.  He was recently discharged for gastroparesis related to his Type I diabetes Patient needs refills and reports he is taking medications as prescribed He is supposed to see a specialist at [facility] in June for his gastroparesis . Hgb A1c 8.15 Jun 2014

## 2014-07-07 NOTE — Progress Notes (Signed)
Subjective:    Patient ID: Duane [name], male    DOB: 06-29-1975, 39 y.o.   MRN: [id number]  HPI  Admit date: 06/27/2014 Discharge date: 07/01/2014  Duane [name] is a 40 year old male with a history of type 1 diabetes mellitus, GERD, gastroparesis, hypertension who presented to the ED with a 2 day history of nausea and vomiting after an episode of diarrhea secondary to gastroenteritis. He was previously admitted for same a month prior to admission.  He was found to have a hemoglobin A1c of 8.3 and in acute renal failure with creatinine of 1.54 and his Lantus dose was reduced due to poor oral intake. His CT abdomen was negative for any acute findings. He received IV fluids, IV Protonix and antiemetics  and symptoms improved gradually and he was able to tolerate orally prior to discharge  Interval History: Has an upcoming appointment at [facility] in June for his gastroparesis. His appetite  Has improved and he has had no nausea or vomiting. Was previously followed by [employer] but lost his job and his health  insurance. Complains of dyspnea on mild exertion and dizziness and has been off balance and this has happened in the past after hospitalization and worsens when he does bending movement  Past Medical History  Diagnosis Date  . Hypertension   . Diabetes mellitus without complication age 89    insulin requiring from the start  . Gastritis   . Gastroparesis 2012    100% gastric retention on 08/2013 GES  . Obesity   . Diabetic neuropathy   . GERD (gastroesophageal reflux disease)    Past Surgical History  Procedure Laterality Date  . Cholecystectomy  2013  . Knee surgery Left   . Flexible sigmoidoscopy N/A 06/11/2013    Procedure: FLEXIBLE SIGMOIDOSCOPY;  Surgeon: [name], MD;  Location: [facility];  Service: Endoscopy;  Laterality: N/A;  . Esophagogastroduodenoscopy N/A 06/11/2013    Procedure: ESOPHAGOGASTRODUODENOSCOPY (EGD);  Surgeon: [name], MD;  Location:  [facility];  Service: Endoscopy;  Laterality: N/A;    History   Social History  . Marital Status: Single    Spouse Name: N/A  . Number of Children: N/A  . Years of Education: N/A   Occupational History  . 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    Social History Main Topics  . Smoking status: Never Smoker   . Smokeless tobacco: Never Used  . Alcohol Use: No     Comment: "none really" - 08/27/13  . Drug Use: No  . Sexual Activity: Yes   Other Topics Concern  . Not on file   Social History Narrative    No Known Allergies  Current Outpatient Prescriptions on File Prior to Visit  Medication Sig Dispense Refill  . glucose blood (ONE TOUCH ULTRA TEST) test strip Use as instructed to check blood sugar once a day dx code 250.00 100 each 3  . Insulin Pen Needle 31G X 8 MM MISC Check blood sugar TID and QHS 100 each 12  . Lancets (ONETOUCH ULTRASOFT) lancets Use as instructed 100 each 12   No current facility-administered medications on file prior to visit.     Review of Systems  Constitutional: Negative for activity change and appetite change.  HENT: Negative for sinus pressure and sore throat.   Eyes: Negative for visual disturbance.  Respiratory: Positive for shortness of breath. Negative for chest tightness.   Cardiovascular: Negative for chest  pain and palpitations.  Gastrointestinal: Negative for abdominal pain and abdominal distention.  Endocrine: Negative for cold intolerance, heat intolerance and polyphagia.  Genitourinary: Negative for dysuria, frequency and difficulty urinating.  Musculoskeletal: Negative for back pain, joint swelling and arthralgias.  Skin: Negative for color change.  Neurological: Positive for dizziness. Negative for tremors and weakness.  Psychiatric/Behavioral: Negative for suicidal ideas and behavioral problems.         Objective: Filed Vitals:   07/07/14 1005  BP: 114/79  Pulse: 73  Temp: 98.4 F (36.9 C)   Height: 6\' 3"  (1.905 m)  Weight: 269 lb (122.018 kg)  SpO2: 96%      Physical Exam  Constitutional: He is oriented to person, place, and time. He appears well-developed and well-nourished.  HENT:  Head: Normocephalic and atraumatic.  Right Ear: External ear normal.  Left Ear: External ear normal.  Eyes: Conjunctivae and EOM are normal. Pupils are equal, round, and reactive to light.  Neck: Normal range of motion. Neck supple. No tracheal deviation present.  Cardiovascular: Normal rate, regular rhythm and normal heart sounds.   No murmur heard. Pulmonary/Chest: Effort normal and breath sounds normal. No respiratory distress. He has no wheezes. He exhibits no tenderness.  Abdominal: Soft. Bowel sounds are normal. He exhibits no mass. There is no tenderness.  Musculoskeletal: Normal range of motion. He exhibits no edema or tenderness.  Neurological: He is alert and oriented to person, place, and time.  Skin: Skin is warm and dry.  Psychiatric: He has a normal mood and affect.    PHQ9 score =20, GAD7 score=15  Lab Results  Component Value Date   HGBA1C 8.3* 06/27/2014         Assessment & Plan:  39 year old male with a history of type 1 diabetes mellitus, GERD, gastroparesis, hypertension  with recent hospitalization for flare up of gastroparesis.  Type 1 diabetes mellitus: Uncontrolled with A1c of 8.3, CBG of 151. I have increased his Lantus from 20 units to 25 units and will reassess his blood sugar log at his next visit.  Gastroparesis: Currently on Reglan every day and takes promethazine as needed. Advised to keep his appointment with [facility].  Hypertension: Blood pressure is controlled. I will discontinue amlodipine and replace it with lisinopril as he needs to be on an Ace inhibitor and hopefully this should help with his hypokalemia which was evident from his last set of labs.  Dizziness: Blood pressures on the low side which could explain this; his negative  orthostatics. If symptoms persist at the next visit I will consider treating him for vertigo. Meanwhile advised to change positions slowly  Dyspnea: Likely due to deconditioning but if this persist I will order 2-D echo at his next visit.  Disclaimer: This note was dictated with voice recognition software. Similar sounding words can inadvertently be transcribed and this note may contain transcription errors which may not have been corrected upon publication of note.

## 2014-07-09 ENCOUNTER — Telehealth: Payer: Self-pay | Admitting: Clinical

## 2014-07-09 ENCOUNTER — Telehealth: Payer: Self-pay

## 2014-07-09 NOTE — Telephone Encounter (Signed)
Nurse called first contact number, invalid number. Nurse called mobile number, reached male that explained "musr  Have the wrong number".

## 2014-07-09 NOTE — Telephone Encounter (Signed)
-----   Message from Arnoldo Morale, MD sent at 07/08/2014  9:27 AM EDT ----- Potassium is back to normal, cholesterol is normal but HDL (good cholesterol) is a bit on the low side; advised to increase physical activity.

## 2014-07-09 NOTE — Telephone Encounter (Signed)
Both numbers given by pt are invalid numbers.

## 2014-07-13 NOTE — Telephone Encounter (Signed)
Nurse called home number, reached recording explaining number is invalid. Nurse called mobile number, person answered telephone, informed nurse wrong number.

## 2014-07-13 NOTE — Telephone Encounter (Signed)
-----   Message from Arnoldo Morale, MD sent at 07/08/2014  9:27 AM EDT ----- Potassium is back to normal, cholesterol is normal but HDL (good cholesterol) is a bit on the low side; advised to increase physical activity.

## 2014-07-15 ENCOUNTER — Ambulatory Visit: Payer: Medicaid Other | Attending: Family Medicine | Admitting: Family Medicine

## 2014-07-15 ENCOUNTER — Encounter: Payer: Self-pay | Admitting: Family Medicine

## 2014-07-15 VITALS — BP 102/68 | HR 113 | Temp 97.8°F | Resp 18 | Ht 75.0 in

## 2014-07-15 DIAGNOSIS — R42 Dizziness and giddiness: Secondary | ICD-10-CM | POA: Diagnosis not present

## 2014-07-15 DIAGNOSIS — E1069 Type 1 diabetes mellitus with other specified complication: Secondary | ICD-10-CM

## 2014-07-15 DIAGNOSIS — I10 Essential (primary) hypertension: Secondary | ICD-10-CM | POA: Diagnosis not present

## 2014-07-15 DIAGNOSIS — H8113 Benign paroxysmal vertigo, bilateral: Secondary | ICD-10-CM | POA: Diagnosis not present

## 2014-07-15 DIAGNOSIS — IMO0002 Reserved for concepts with insufficient information to code with codable children: Secondary | ICD-10-CM

## 2014-07-15 DIAGNOSIS — K529 Noninfective gastroenteritis and colitis, unspecified: Secondary | ICD-10-CM | POA: Diagnosis not present

## 2014-07-15 DIAGNOSIS — E108 Type 1 diabetes mellitus with unspecified complications: Secondary | ICD-10-CM

## 2014-07-15 DIAGNOSIS — E1065 Type 1 diabetes mellitus with hyperglycemia: Secondary | ICD-10-CM

## 2014-07-15 DIAGNOSIS — H8110 Benign paroxysmal vertigo, unspecified ear: Secondary | ICD-10-CM | POA: Insufficient documentation

## 2014-07-15 LAB — GLUCOSE, POCT (MANUAL RESULT ENTRY): POC Glucose: 115 mg/dl — AB (ref 70–99)

## 2014-07-15 LAB — BASIC METABOLIC PANEL
BUN: 16 mg/dL (ref 6–23)
CALCIUM: 9.5 mg/dL (ref 8.4–10.5)
CO2: 23 meq/L (ref 19–32)
CREATININE: 2 mg/dL — AB (ref 0.50–1.35)
Chloride: 106 mEq/L (ref 96–112)
GLUCOSE: 139 mg/dL — AB (ref 70–99)
Potassium: 5.5 mEq/L — ABNORMAL HIGH (ref 3.5–5.3)
Sodium: 140 mEq/L (ref 135–145)

## 2014-07-15 MED ORDER — DIPHENOXYLATE-ATROPINE 2.5-0.025 MG PO TABS: 1.0000 | ORAL_TABLET | Freq: Four times a day (QID) | ORAL | Status: DC | PRN

## 2014-07-15 MED ORDER — METOPROLOL TARTRATE 25 MG PO TABS: 25.0000 mg | ORAL_TABLET | Freq: Two times a day (BID) | ORAL | Status: DC

## 2014-07-15 MED ORDER — METRONIDAZOLE 500 MG PO TABS: 500.0000 mg | ORAL_TABLET | Freq: Three times a day (TID) | ORAL | Status: DC

## 2014-07-15 MED ORDER — MECLIZINE HCL 25 MG PO TABS: 25.0000 mg | ORAL_TABLET | Freq: Two times a day (BID) | ORAL | Status: DC | PRN

## 2014-07-15 MED ORDER — GLUCOSE BLOOD VI STRP: ORAL_STRIP | Status: DC

## 2014-07-15 MED ORDER — SODIUM CHLORIDE 0.9 % IV SOLN
Freq: Once | INTRAVENOUS | Status: AC
  Administered 2014-07-15: 12:00:00 via INTRAVENOUS

## 2014-07-15 MED ORDER — PROMETHAZINE HCL 25 MG/ML IJ SOLN
25.0000 mg | Freq: Once | INTRAMUSCULAR | Status: AC
  Administered 2014-07-15: 25 mg via INTRAMUSCULAR

## 2014-07-15 NOTE — Patient Instructions (Addendum)

## 2014-07-15 NOTE — Progress Notes (Signed)
Patient having nausea, diarrhea, dizziness, sweating profusely, impaired balance for 3 days. No vomiting. Patient has been taking phenergan, helps for a little while. Patient has been taking pepto and immodium with no relief. Patient in bathroom every 5-10 minutes. Can't sleep at night due to going to bathroom. Patient reports pain in upper stomach, at level 5, described as aching. Patient has not had anything to eat or drink since 10pm last night. Patient concerned about possibly having IBS, Chrohns, and cdiff. Had same episode a couple weeks ago and was in hospital for gastroparesis. Patient thinks its more than gastroparesis.

## 2014-07-15 NOTE — Progress Notes (Signed)
Subjective:    Patient ID: Duane [name], male    DOB: 03-May-1975, 39 y.o.   MRN: [id number]  HPI  Duane [name] is a 39 year old male with a history of type 1 diabetes mellitus (A1c 8.3), GERD, gastroparesis, hypertension with recent hospitalization for flare up of gastroparesis who had complained of Dizziness at his last office visit which was attributed to hypotension and his amlodipine was discontinued and he was placed on low dose Lisinopril for reno protection.  He has been dizzy for a while and this has worsened over the last 3 days since he started having diarrhea which is non bloody and goes >10 times/ day and Imodium has not helped.Has been able to eat a bit and has taken Phenergan. Also has Pain in his upper abdomen. As soon as he eats, it runs through him. Pain is 5/10; he also feels hot and states this is making him sweat. He denies any vomiting which is usually associated with his gastroparesis and this episode of different. States he has had previous episodes of diarrhea prior to now and in between he has had semi solid stools. Denies eating out recently. His balance has been off. Has not had breakfast today neither has he taken any of his medications.  Random blood sugars have been <200 and have been in the 110s fasting.  Past Medical History  Diagnosis Date  . Hypertension   . Diabetes mellitus without complication age 57    insulin requiring from the start  . Gastritis   . Gastroparesis 2012    100% gastric retention on 08/2013 GES  . Obesity   . Diabetic neuropathy   . GERD (gastroesophageal reflux disease)     Past Surgical History  Procedure Laterality Date  . Cholecystectomy  2013  . Knee surgery Left   . Flexible sigmoidoscopy N/A 06/11/2013    Procedure: FLEXIBLE SIGMOIDOSCOPY;  Surgeon: [name], MD;  Location: [facility];  Service: Endoscopy;  Laterality: N/A;  . Esophagogastroduodenoscopy N/A 06/11/2013    Procedure: ESOPHAGOGASTRODUODENOSCOPY  (EGD);  Surgeon: [name], MD;  Location: [facility];  Service: Endoscopy;  Laterality: N/A;    Family History  Problem Relation Age of Onset  . Diabetes Father     No Known Allergies  Current Outpatient Prescriptions on File Prior to Visit  Medication Sig Dispense Refill  . gabapentin (NEURONTIN) 300 MG capsule Take 1 capsule (300 mg total) by mouth at bedtime. 30 capsule 3  . insulin aspart (NOVOLOG) cartridge Inject 4 Units into the skin 3 (three) times daily with meals. (As per sliding scale) 15 mL 3  . insulin glargine (LANTUS) 100 UNIT/ML injection Inject 0.25 mLs (25 Units total) into the skin at bedtime. 10 mL 3  . Insulin Pen Needle 31G X 8 MM MISC Check blood sugar TID and QHS 100 each 12  . Lancets (ONETOUCH ULTRASOFT) lancets Use as instructed 100 each 12  . lisinopril (PRINIVIL,ZESTRIL) 2.5 MG tablet Take 2 tablets (5 mg total) by mouth daily. 30 tablet 2  . metoCLOPramide (REGLAN) 10 MG tablet Take 1 tablet (10 mg total) by mouth 4 (four) times daily -  before meals and at bedtime. For 1 week then as needed 120 tablet 3  . pantoprazole (PROTONIX) 40 MG tablet Take 1 tablet (40 mg total) by mouth 2 (two) times daily. 60 tablet 3  . promethazine (PHENERGAN) 25 MG tablet Take 1 tablet (25 mg total) by mouth every 8 (eight) hours as needed  for nausea or vomiting. 60 tablet 3  . sucralfate (CARAFATE) 1 G tablet Take 1 tablet (1 g total) by mouth 3 (three) times daily with meals. 90 tablet 3   No current facility-administered medications on file prior to visit.    Review of Systems  Constitutional: Positive for diaphoresis and appetite change. Negative for activity change.  HENT: Negative for sinus pressure and sore throat.   Eyes: Negative for visual disturbance.  Respiratory: Negative for chest tightness and shortness of breath.   Cardiovascular: Negative for chest pain and palpitations.  Gastrointestinal:       See hpi  Endocrine: Negative for cold intolerance,  heat intolerance and polyphagia.  Genitourinary: Negative for dysuria, frequency and difficulty urinating.  Musculoskeletal: Negative for back pain, joint swelling and arthralgias.  Skin: Negative for color change.  Neurological: Positive for light-headedness. Negative for dizziness, tremors and weakness.  Psychiatric/Behavioral: Negative for suicidal ideas and behavioral problems.         Objective: Filed Vitals:   07/15/14 0956  BP: 102/68  Pulse: 113  Temp: 97.8 F (36.6 C)  TempSrc: Oral  Resp: 18  Height: 6\' 3"  (1.905 m)  SpO2: 100%      Physical Exam  Constitutional: He is oriented to person, place, and time.  Ill looking, diaphoretic  HENT:  Head: Normocephalic and atraumatic.  Right Ear: External ear normal.  Left Ear: External ear normal.  Eyes: Conjunctivae and EOM are normal. Pupils are equal, round, and reactive to light.  Neck: Normal range of motion. Neck supple. No tracheal deviation present.  Cardiovascular: Regular rhythm and normal heart sounds.  Tachycardia present.   No murmur heard. Pulmonary/Chest: Effort normal and breath sounds normal. No respiratory distress. He has no wheezes. He exhibits no tenderness.  Abdominal: Soft. Bowel sounds are normal. He exhibits no mass. There is no tenderness.  Musculoskeletal: Normal range of motion. He exhibits no edema or tenderness.  Neurological: He is alert and oriented to person, place, and time.  Skin: Skin is warm. He is diaphoretic. There is pallor.  Psychiatric: He has a normal mood and affect.          Assessment & Plan:  39 year old male with a history of type 1 diabetes mellitus (A1c 8.3), GERD, gastroparesis, hypertension with recent hospitalization for gastroparesis flare presenting today with acute on chronic diarrhea   Gastroenteritis: Of chronic onset as per patient. Unable to exclude  infectious etiology Will send off stool cx and will treat presumptively for C.diff Will need to exclude  inflammatory bowel disease, labs sent off and  if labs are neg I will refer to GI for colonoscopy  Dizziness/vertigo Placed on Meclizine Hopefully reduction in dose of Metoprolol bring about improvement in symptoms. Will see back ion  Week to reassess  Hypertension Bp is on the low side and so i have decreased his dose of metoprolol  Type 1 DM: Hba1c of 8.3. Blood sugars are improving based on home blood sugars which he reports today    Addendum: Notified by nurse that the patient just threw up. IM Phenergan 25 mg given, placed on IV NS and received 555mls bolus and patient improved symptomatically. His cousin notified to come pick him up. ED precautions discussed.

## 2014-07-16 LAB — PAN-ANCA
ATYPICAL P-ANCA SCREEN: NEGATIVE
C-ANCA SCREEN: NEGATIVE
Myeloperoxidase Abs: <1
p-ANCA Screen: NEGATIVE

## 2014-07-16 LAB — SEDIMENTATION RATE: Sed Rate: 1 mm/hr (ref 0–15)

## 2014-07-19 NOTE — Telephone Encounter (Signed)
Nurse called home number, reached recording explaining number is invalid. Nurse called mobile number, male answered telephone, informed nurse of wrong number.

## 2014-07-19 NOTE — Telephone Encounter (Signed)
-----   Message from Arnoldo Morale, MD sent at 07/16/2014 12:17 PM EDT ----- Please inform him that his labs revealed mildly elevated potassium level and creatinine and this could be from his dehydration and I will repeat his labs at his next office visit.

## 2014-07-20 NOTE — Telephone Encounter (Signed)
-----   Message from Arnoldo Morale, MD sent at 07/17/2014  9:46 PM EDT ----- Please inform the patient that labs are normal. Thank you.

## 2014-07-22 ENCOUNTER — Ambulatory Visit: Payer: Self-pay | Admitting: Family Medicine

## 2014-07-22 NOTE — Telephone Encounter (Signed)
-----   Message from Arnoldo Morale, MD sent at 07/16/2014 12:17 PM EDT ----- Please inform him that his labs revealed mildly elevated potassium level and creatinine and this could be from his dehydration and I will repeat his labs at his next office visit.

## 2014-07-26 ENCOUNTER — Ambulatory Visit: Payer: Medicaid Other | Attending: Family Medicine | Admitting: Family Medicine

## 2014-07-26 ENCOUNTER — Encounter: Payer: Self-pay | Admitting: Family Medicine

## 2014-07-26 VITALS — BP 115/80 | HR 84 | Temp 98.0°F | Resp 18 | Ht 75.0 in | Wt 275.0 lb

## 2014-07-26 DIAGNOSIS — E108 Type 1 diabetes mellitus with unspecified complications: Secondary | ICD-10-CM

## 2014-07-26 DIAGNOSIS — H8113 Benign paroxysmal vertigo, bilateral: Secondary | ICD-10-CM | POA: Insufficient documentation

## 2014-07-26 DIAGNOSIS — M79642 Pain in left hand: Secondary | ICD-10-CM

## 2014-07-26 DIAGNOSIS — E875 Hyperkalemia: Secondary | ICD-10-CM | POA: Diagnosis not present

## 2014-07-26 DIAGNOSIS — E1069 Type 1 diabetes mellitus with other specified complication: Secondary | ICD-10-CM | POA: Diagnosis not present

## 2014-07-26 DIAGNOSIS — E1065 Type 1 diabetes mellitus with hyperglycemia: Secondary | ICD-10-CM

## 2014-07-26 DIAGNOSIS — I10 Essential (primary) hypertension: Secondary | ICD-10-CM | POA: Diagnosis not present

## 2014-07-26 DIAGNOSIS — K529 Noninfective gastroenteritis and colitis, unspecified: Secondary | ICD-10-CM | POA: Insufficient documentation

## 2014-07-26 DIAGNOSIS — IMO0002 Reserved for concepts with insufficient information to code with codable children: Secondary | ICD-10-CM

## 2014-07-26 DIAGNOSIS — Z794 Long term (current) use of insulin: Secondary | ICD-10-CM | POA: Insufficient documentation

## 2014-07-26 LAB — BASIC METABOLIC PANEL
BUN: 11 mg/dL (ref 6–23)
CALCIUM: 9.5 mg/dL (ref 8.4–10.5)
CHLORIDE: 107 meq/L (ref 96–112)
CO2: 24 meq/L (ref 19–32)
Creat: 1.21 mg/dL (ref 0.50–1.35)
Glucose, Bld: 145 mg/dL — ABNORMAL HIGH (ref 70–99)
Potassium: 5.4 mEq/L — ABNORMAL HIGH (ref 3.5–5.3)
Sodium: 142 mEq/L (ref 135–145)

## 2014-07-26 LAB — GLUCOSE, POCT (MANUAL RESULT ENTRY): POC Glucose: 142 mg/dl — AB (ref 70–99)

## 2014-07-26 MED ORDER — INSULIN PEN NEEDLE 31G X 8 MM MISC: 1.0000 | Freq: Three times a day (TID) | Status: DC

## 2014-07-26 MED ORDER — GLUCOSE BLOOD VI STRP: ORAL_STRIP | Status: DC

## 2014-07-26 MED ORDER — INSULIN ASPART 100 UNIT/ML FLEXPEN
4.0000 [IU] | PEN_INJECTOR | Freq: Three times a day (TID) | SUBCUTANEOUS | Status: DC
Start: 2014-07-26 — End: 2015-07-18

## 2014-07-26 NOTE — Progress Notes (Signed)
Quick Note:  Labs addressed at office as well as medications and patient was made aware. ______ 

## 2014-07-26 NOTE — Telephone Encounter (Signed)
-----   Message from Arnoldo Morale, MD sent at 07/08/2014  9:27 AM EDT ----- Potassium is back to normal, cholesterol is normal but HDL (good cholesterol) is a bit on the low side; advised to increase physical activity.

## 2014-07-26 NOTE — Progress Notes (Signed)
Subjective:    Patient ID: Duane [name], male    DOB: Jul 11, 1975, 39 y.o.   MRN: [id number]  HPI [name] is a 39 year old male with a history of type 1 diabetes mellitus (A1c 8.3), GERD, gastroparesis, hypertension with recent hospitalization for flare up of gastroparesis who had complained of Dizziness at a previous office visit which was attributed to hypotension and his amlodipine was discontinued and the dose of metoprolol decreased. He also had  Diarrhea which had been of chronic nature and a remote history of antibiotic use and so he was treated presumptively for C. difficile and was supposed to bring in stool specimens so cultures could be sent off which he never did. He received IV fluids during that visit as he was also vomiting and labs returned with hyperkalemia 5.5. He had been concerned about inflammatory bowel disease and wanted to be checked for that but he denied a history of hematochezia and no family history of inflammatory bowel disease or colon malignancies. He has an upcoming appointment at [facility] for evaluation of his gastroparesis.  He complains of pain on the dorsal aspect of his left thumb from where IV access was attempted at the last office visit. He described it as shooting pain which radiates from his mid left forearm down to his thumb with certain hand movements. His diarrhea has resolved and he does not have any dizziness at this time.  Past Medical History  Diagnosis Date  . Hypertension   . Diabetes mellitus without complication age 44    insulin requiring from the start  . Gastritis   . Gastroparesis 2012    100% gastric retention on 08/2013 GES  . Obesity   . Diabetic neuropathy   . GERD (gastroesophageal reflux disease)     Past Surgical History  Procedure Laterality Date  . Cholecystectomy  2013  . Knee surgery Left   . Flexible sigmoidoscopy N/A 06/11/2013    Procedure: FLEXIBLE SIGMOIDOSCOPY;  Surgeon: [name], MD;  Location: [facility];  Service: Endoscopy;  Laterality: N/A;  . Esophagogastroduodenoscopy N/A 06/11/2013    Procedure: ESOPHAGOGASTRODUODENOSCOPY (EGD);  Surgeon: [name], MD;  Location: [facility];  Service: Endoscopy;  Laterality: N/A;    History   Social History  . Marital Status: Single    Spouse Name: N/A  . Number of Children: N/A  . Years of Education: N/A   Occupational History  . 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    Social History Main Topics  . Smoking status: Never Smoker   . Smokeless tobacco: Never Used  . Alcohol Use: No     Comment: "none really" - 08/27/13  . Drug Use: No  . Sexual Activity: Yes   Other Topics Concern  . Not on file   Social History Narrative    No Known Allergies  Current Outpatient Prescriptions on File Prior to Visit  Medication Sig Dispense Refill  . gabapentin (NEURONTIN) 300 MG capsule Take 1 capsule (300 mg total) by mouth at bedtime. 30 capsule 3  . insulin glargine (LANTUS) 100 UNIT/ML injection Inject 0.25 mLs (25 Units total) into the skin at bedtime. 10 mL 3  . Lancets (ONETOUCH ULTRASOFT) lancets Use as instructed 100 each 12  . lisinopril (PRINIVIL,ZESTRIL) 2.5 MG tablet Take 2 tablets (5 mg total) by mouth daily. 30 tablet 2  . meclizine (ANTIVERT) 25 MG tablet Take 1 tablet (  25 mg total) by mouth 2 (two) times daily as needed. 60 tablet 1  . metoCLOPramide (REGLAN) 10 MG tablet Take 1 tablet (10 mg total) by mouth 4 (four) times daily -  before meals and at bedtime. For 1 week then as needed 120 tablet 3  . metoprolol (LOPRESSOR) 25 MG tablet Take 1 tablet (25 mg total) by mouth 2 (two) times daily. 60 tablet 2  . pantoprazole (PROTONIX) 40 MG tablet Take 1 tablet (40 mg total) by mouth 2 (two) times daily. 60 tablet 3  . promethazine (PHENERGAN) 25 MG tablet Take 1 tablet (25 mg total) by mouth every 8 (eight) hours as needed for nausea or vomiting. 60 tablet 3  .  sucralfate (CARAFATE) 1 G tablet Take 1 tablet (1 g total) by mouth 3 (three) times daily with meals. 90 tablet 3  . diphenoxylate-atropine (LOMOTIL) 2.5-0.025 MG per tablet Take 1 tablet by mouth 4 (four) times daily as needed for diarrhea or loose stools. (Patient not taking: Reported on 07/26/2014) 30 tablet 0   No current facility-administered medications on file prior to visit.     Review of Systems General: negative for fever, weight loss, appetite change Eyes: no visual symptoms. ENT: no ear symptoms, no sinus tenderness, no nasal congestion or sore throat. Neck: no pain  Respiratory: no wheezing, shortness of breath, cough Cardiovascular: no chest pain, no dyspnea on exertion, no pedal edema, no orthopnea. Gastrointestinal: no abdominal pain, no diarrhea, no constipation Genito-Urinary: no urinary frequency, no dysuria, no polyuria. Hematologic: no bruising Endocrine: no cold or heat intolerance Neurological: no headaches, no seizures, no tremors Musculoskeletal: no joint pains, no joint swelling Skin: no pruritus, no rash. Psychological: no depression, no anxiety,      Objective: Filed Vitals:   07/26/14 1435  BP: 115/80  Pulse: 84  Temp: 98 F (36.7 C)  TempSrc: Oral  Resp: 18  Height: 6\' 3"  (1.905 m)  Weight: 275 lb (124.739 kg)  SpO2: 99%      Physical Exam  Constitutional: normal appearing,  Eyes: PERRLA HEENT: Head is atraumatic, normal sinuses, normal oropharynx, normal appearing tonsils and palate, tympanic membrane is normal bilaterally. Neck: normal range of motion, no thyromegaly, no JVD Cardiovascular: normal rate and rhythm, normal heart sounds, no murmurs, rub or gallop, no pedal edema Respiratory: clear to auscultation bilaterally, no wheezes, no rales, no rhonchi Abdomen: soft, not tender to palpation, normal bowel sounds, no enlarged organs Extremities: Full ROM, mild tenderness on extension of left thumb Skin: warm and dry, no  lesions. Neurological: alert, oriented x3, cranial nerves I-XII grossly intact , normal motor strength, normal sensation. Psychological: normal mood.          Assessment & Plan:  39 year old male with a history of type 1 diabetes mellitus (A1c 8.3), GERD, gastroparesis, hypertension with recent hospitalization for gastroparesis flare who was seen for chronic diarrhea and possible gastroenteritis at his last office visit.  Gastroenteritis: Resolved Of chronic onset as per patient. Completed course of Flagyl He will be seeing GI at [facility] and he will need to discuss this with them as colonoscopy will be needed to exclude inflammatory bowel disease.  Dizziness/vertigo Currently on Meclizine Resolved  Hypertension Controlled   Type 1 DM: Hba1c of 8.3. CBG shows some improvement; no regimen changes.  Hyperkalemia: Repeat BMET today Could be secondary to acute gastroenteritis he previously had.  Hand pain: Advised to use hand brace ; he is already on Gabapentin.

## 2014-07-26 NOTE — Progress Notes (Signed)
Patient here for follow up.  Patient states he feels "okay," better than last time.  Patient has no complaints of pain at this time.  Patient requests refill on test strips for glucometer and possibly new glucometer set. Patient current CBG: 142

## 2014-07-26 NOTE — Telephone Encounter (Signed)
Patient aware of all 3 previous messages from Dr. Jarold Song. Patients phone number was incorrect in chart. Correct phone number [phone]. Second telephone number can be removed.

## 2014-07-26 NOTE — Patient Instructions (Signed)
Diabetes Mellitus and Food It is important for you to manage your blood sugar (glucose) level. Your blood glucose level can be greatly affected by what you eat. Eating healthier foods in the appropriate amounts throughout the day at about the same time each day will help you control your blood glucose level. It can also help slow or prevent worsening of your diabetes mellitus. Healthy eating may even help you improve the level of your blood pressure and reach or maintain a healthy weight.  HOW CAN FOOD AFFECT ME? Carbohydrates Carbohydrates affect your blood glucose level more than any other type of food. Your dietitian will help you determine how many carbohydrates to eat at each meal and teach you how to count carbohydrates. Counting carbohydrates is important to keep your blood glucose at a healthy level, especially if you are using insulin or taking certain medicines for diabetes mellitus. Alcohol Alcohol can cause sudden decreases in blood glucose (hypoglycemia), especially if you use insulin or take certain medicines for diabetes mellitus. Hypoglycemia can be a life-threatening condition. Symptoms of hypoglycemia (sleepiness, dizziness, and disorientation) are similar to symptoms of having too much alcohol.  If your health care provider has given you approval to drink alcohol, do so in moderation and use the following guidelines:  Women should not have more than one drink per day, and men should not have more than two drinks per day. One drink is equal to:  12 oz of beer.  5 oz of wine.  1 oz of hard liquor.  Do not drink on an empty stomach.  Keep yourself hydrated. Have water, diet soda, or unsweetened iced tea.  Regular soda, juice, and other mixers might contain a lot of carbohydrates and should be counted. WHAT FOODS ARE NOT RECOMMENDED? As you make food choices, it is important to remember that all foods are not the same. Some foods have fewer nutrients per serving than other  foods, even though they might have the same number of calories or carbohydrates. It is difficult to get your body what it needs when you eat foods with fewer nutrients. Examples of foods that you should avoid that are high in calories and carbohydrates but low in nutrients include:  Trans fats (most processed foods list trans fats on the Nutrition Facts label).  Regular soda.  Juice.  Candy.  Sweets, such as cake, pie, doughnuts, and cookies.  Fried foods. WHAT FOODS CAN I EAT? Have nutrient-rich foods, which will nourish your body and keep you healthy. The food you should eat also will depend on several factors, including:  The calories you need.  The medicines you take.  Your weight.  Your blood glucose level.  Your blood pressure level.  Your cholesterol level. You also should eat a variety of foods, including:  Protein, such as meat, poultry, fish, tofu, nuts, and seeds (lean animal proteins are best).  Fruits.  Vegetables.  Dairy products, such as milk, cheese, and yogurt (low fat is best).  Breads, grains, pasta, cereal, rice, and beans.  Fats such as olive oil, trans fat-free margarine, canola oil, avocado, and olives. DOES EVERYONE WITH DIABETES MELLITUS HAVE THE SAME MEAL PLAN? Because every person with diabetes mellitus is different, there is not one meal plan that works for everyone. It is very important that you meet with a dietitian who will help you create a meal plan that is just right for you. Document Released: 10/26/2004 Document Revised: 02/03/2013 Document Reviewed: 12/26/2012 [employer] Patient Information 2015 [employer]. This   information is not intended to replace advice given to you by your health care provider. Make sure you discuss any questions you have with your health care provider.  

## 2014-07-27 ENCOUNTER — Telehealth: Payer: Self-pay

## 2014-07-27 ENCOUNTER — Telehealth: Payer: Self-pay | Admitting: Clinical

## 2014-07-27 NOTE — Telephone Encounter (Signed)
-----   Message from Arnoldo Morale, MD sent at 07/27/2014  8:29 AM EDT ----- Potassium is still mildly elevated, please advise him to hold off on Lisinopril until his visit with his PCP and he will need a repeat level then.

## 2014-07-27 NOTE — Telephone Encounter (Signed)
Nurse called patient. No answer or voicemail.

## 2014-07-27 NOTE — Telephone Encounter (Signed)
Complete call, f/u w pt

## 2014-07-27 NOTE — Progress Notes (Signed)
Quick Note:  Potassium is still mildly elevated, please advise him to hold off on Lisinopril until his visit with his PCP and he will need a repeat level then. ______

## 2014-07-28 NOTE — Telephone Encounter (Signed)
Nurse called patient, patient verified date of birth. Patient aware of mildly elevated potassium. Patient agrees to hold off on lisinopril until visit with PCP and repeat labs will be drawn then. Patient voices understanding and has no further questions at this time.

## 2014-07-28 NOTE — Telephone Encounter (Signed)
-----   Message from Arnoldo Morale, MD sent at 07/27/2014  8:29 AM EDT ----- Potassium is still mildly elevated, please advise him to hold off on Lisinopril until his visit with his PCP and he will need a repeat level then.

## 2014-08-11 ENCOUNTER — Other Ambulatory Visit: Payer: Self-pay | Admitting: *Deleted

## 2014-08-11 ENCOUNTER — Telehealth: Payer: Self-pay | Admitting: Family Medicine

## 2014-08-11 DIAGNOSIS — E1065 Type 1 diabetes mellitus with hyperglycemia: Secondary | ICD-10-CM

## 2014-08-11 MED ORDER — GLUCOSE BLOOD VI STRP: ORAL_STRIP | Status: DC

## 2014-08-11 NOTE — Telephone Encounter (Signed)
Patient called requesting test strips refill, please f/u with patient

## 2014-08-26 ENCOUNTER — Ambulatory Visit: Payer: Self-pay | Admitting: Family Medicine

## 2014-09-09 ENCOUNTER — Other Ambulatory Visit: Payer: Self-pay | Admitting: Pharmacist

## 2014-09-09 DIAGNOSIS — E119 Type 2 diabetes mellitus without complications: Secondary | ICD-10-CM

## 2014-09-09 DIAGNOSIS — Z794 Long term (current) use of insulin: Secondary | ICD-10-CM

## 2014-09-09 MED ORDER — ACCU-CHEK AVIVA PLUS W/DEVICE KIT: PACK | Status: DC

## 2014-09-13 ENCOUNTER — Ambulatory Visit: Payer: Medicaid Other | Attending: Family Medicine | Admitting: Family Medicine

## 2014-09-13 ENCOUNTER — Encounter: Payer: Self-pay | Admitting: Family Medicine

## 2014-09-13 VITALS — BP 128/89 | HR 86 | Temp 98.5°F | Resp 18 | Ht 75.0 in | Wt 283.0 lb

## 2014-09-13 DIAGNOSIS — E1143 Type 2 diabetes mellitus with diabetic autonomic (poly)neuropathy: Secondary | ICD-10-CM

## 2014-09-13 DIAGNOSIS — K3184 Gastroparesis: Secondary | ICD-10-CM

## 2014-09-13 DIAGNOSIS — E109 Type 1 diabetes mellitus without complications: Secondary | ICD-10-CM | POA: Insufficient documentation

## 2014-09-13 DIAGNOSIS — K529 Noninfective gastroenteritis and colitis, unspecified: Secondary | ICD-10-CM | POA: Insufficient documentation

## 2014-09-13 DIAGNOSIS — G8929 Other chronic pain: Secondary | ICD-10-CM | POA: Insufficient documentation

## 2014-09-13 DIAGNOSIS — E1042 Type 1 diabetes mellitus with diabetic polyneuropathy: Secondary | ICD-10-CM

## 2014-09-13 DIAGNOSIS — E0843 Diabetes mellitus due to underlying condition with diabetic autonomic (poly)neuropathy: Secondary | ICD-10-CM

## 2014-09-13 DIAGNOSIS — Z114 Encounter for screening for human immunodeficiency virus [HIV]: Secondary | ICD-10-CM

## 2014-09-13 DIAGNOSIS — I10 Essential (primary) hypertension: Secondary | ICD-10-CM | POA: Insufficient documentation

## 2014-09-13 DIAGNOSIS — M25562 Pain in left knee: Secondary | ICD-10-CM

## 2014-09-13 DIAGNOSIS — M25462 Effusion, left knee: Secondary | ICD-10-CM | POA: Insufficient documentation

## 2014-09-13 LAB — BASIC METABOLIC PANEL
BUN: 14 mg/dL (ref 7–25)
CHLORIDE: 103 mmol/L (ref 98–110)
CO2: 23 mmol/L (ref 20–31)
CREATININE: 1.17 mg/dL (ref 0.60–1.35)
Calcium: 9.4 mg/dL (ref 8.6–10.3)
Glucose, Bld: 139 mg/dL — ABNORMAL HIGH (ref 65–99)
Potassium: 4.8 mmol/L (ref 3.5–5.3)
Sodium: 142 mmol/L (ref 135–146)

## 2014-09-13 LAB — POCT GLYCOSYLATED HEMOGLOBIN (HGB A1C): HEMOGLOBIN A1C: 7.6

## 2014-09-13 LAB — GLUCOSE, POCT (MANUAL RESULT ENTRY): POC Glucose: 149 mg/dl — AB (ref 70–99)

## 2014-09-13 MED ORDER — GABAPENTIN 300 MG PO CAPS: 300.0000 mg | ORAL_CAPSULE | Freq: Every day | ORAL | Status: DC

## 2014-09-13 NOTE — Assessment & Plan Note (Signed)
HTN: Continue metoprolol Do not take lisinopril for now due to risk of kidney injury in setting of lisinopril and diarrhea

## 2014-09-13 NOTE — Assessment & Plan Note (Signed)
Diabetes with neuropathy and gastroparesis  Check and write down sugars  Diabetes blood sugar goals  Fasting (in AM before breakfast, 8 hrs of no eating or drinking (except water or unsweetened coffee or tea): 90-110 2 hrs after meals: < 160,   No low sugars: nothing < 70   Continue lantus 25 U Continue 4-9 U of novolog with meals,  Referral to opthalmology

## 2014-09-13 NOTE — Assessment & Plan Note (Signed)
L knee pain in setting of ACL tear many years ago Ortho referral placed

## 2014-09-13 NOTE — Progress Notes (Signed)
Establish Care with PCP F/U DM  Glucose running between 100- 240 No tobacco Hx

## 2014-09-13 NOTE — Progress Notes (Signed)
   Subjective:    Patient ID: Duane [name], male    DOB: Jan 10, 1976, 39 y.o.   MRN: [id number] CC: f/u IDDM type 1 and HTN, chronic diarrhea HPI   1. Chronic diarrhea: x one year or so. Comes and goes last 3-7 days. llast bout of diarrhea was 3 weeks ago. Watery stool, dark. No associated abdominal pain. Fever or chills. Associated with dehydration. Has know gastroparesis and has had gallbladder removed. No travel out of the country and no sick contacts.   2. IDDM type 1:  Dx at age 89. Previously under the care of endocrinologist. Complications include neuropathy and gastroparesis. Currently disabled. Has medicaid. Last ate at 1 AM, did not cover snacks. Takes 4-8 U of novolog with meals and 25 U of lantus. Reports numbness and tingling in feet. No CBGs < 80. Due for diabetic eye exam.   3.  L knee pain: this is chronic pain. Has reportedly torn his ACL x 2. Had repair x one. Has intermittent pain with swelling. No recent injuries. No falls. Requesting f/u MRI of L knee.   4. HTN: no taking lisinopril. Holding due to dizziness at last OV and diarrhea. No longer dizzy. No HA, CP or SOB.   History  Substance Use Topics  . Smoking status: Never Smoker   . Smokeless tobacco: Never Used  . Alcohol Use: No     Comment: "none really" - 08/27/13   Review of Systems  Constitutional: Negative for fever, chills, fatigue and unexpected weight change.  Eyes: Positive for visual disturbance.  Respiratory: Negative for cough and shortness of breath.   Cardiovascular: Negative for chest pain, palpitations and leg swelling.  Gastrointestinal: Negative for nausea, vomiting, abdominal pain, diarrhea, constipation and blood in stool.  Musculoskeletal: Positive for joint swelling and arthralgias. Negative for myalgias, back pain, gait problem and neck pain.  Skin: Negative for rash.  Neurological: Positive for numbness. Negative for weakness.      Objective:   Physical Exam BP 128/89 mmHg  Pulse 86   Temp(Src) 98.5 F (36.9 C) (Oral)  Resp 18  Ht 6\' 3"  (1.905 m)  Wt 283 lb (128.368 kg)  BMI 35.37 kg/m2  SpO2 99%  BP Readings from Last 3 Encounters:  09/13/14 128/89  07/26/14 115/80  07/15/14 102/68   Wt Readings from Last 3 Encounters:  09/13/14 283 lb (128.368 kg)  07/26/14 275 lb (124.739 kg)  07/07/14 269 lb (122.018 kg)  General appearance: alert, cooperative and no distress Lungs: clear to auscultation bilaterally Heart: regular rate and rhythm, S1, S2 normal, no murmur, click, rub or gallop  Abdomen: NABs, soft, NT/ND, no mass  Extremities: extremities normal, atraumatic, no cyanosis or edema,   Lab Results  Component Value Date   HGBA1C 7.60 09/13/2014   CBG 149     Assessment & Plan:

## 2014-09-13 NOTE — Assessment & Plan Note (Signed)
Chronic diarrhea: GI referral placed Checking BMP today

## 2014-09-13 NOTE — Patient Instructions (Addendum)
Duane [name],  Thank you for coming in today. It was a pleasure meeting you. I look forward to being your primary doctor.  1. Diabetes with neuropathy and gastroparesis  Check and write down sugars  Diabetes blood sugar goals  Fasting (in AM before breakfast, 8 hrs of no eating or drinking (except water or unsweetened coffee or tea): 90-110 2 hrs after meals: < 160,   No low sugars: nothing < 70   Continue lantus 25 U Continue 4-9 U of novolog with meals,  Referral to opthalmology   2. HTN: Continue metoprolol Do not take lisinopril for now due to risk of kidney injury in setting of lisinopril and diarrhea  3. Chronic diarrhea: GI referral placed Checking BMP today  4. L knee pain in setting of ACL tear many years ago Ortho referral placed   5. Healthcare maintenance: Checking screening HIV today   F/u in 6 weeks with RN for CBG review and flu shot  F.u with me in 3 months   Dr. Adrian Blackwater

## 2014-09-14 LAB — HIV ANTIBODY (ROUTINE TESTING W REFLEX): HIV 1&2 Ab, 4th Generation: NONREACTIVE

## 2014-09-14 LAB — MICROALBUMIN, URINE: Microalb, Ur: 11.3 mg/dL — ABNORMAL HIGH (ref <2.0)

## 2014-09-17 ENCOUNTER — Ambulatory Visit: Payer: Medicaid Other | Attending: Family Medicine | Admitting: Family Medicine

## 2014-09-17 ENCOUNTER — Encounter: Payer: Self-pay | Admitting: Family Medicine

## 2014-09-17 VITALS — BP 125/87 | HR 71 | Temp 98.7°F | Resp 18 | Ht 75.0 in | Wt 284.0 lb

## 2014-09-17 DIAGNOSIS — L918 Other hypertrophic disorders of the skin: Secondary | ICD-10-CM | POA: Diagnosis not present

## 2014-09-17 DIAGNOSIS — E1143 Type 2 diabetes mellitus with diabetic autonomic (poly)neuropathy: Secondary | ICD-10-CM

## 2014-09-17 DIAGNOSIS — K3184 Gastroparesis: Secondary | ICD-10-CM

## 2014-09-17 DIAGNOSIS — Z794 Long term (current) use of insulin: Secondary | ICD-10-CM | POA: Insufficient documentation

## 2014-09-17 NOTE — Progress Notes (Signed)
Patient ID: Duane [name], male   DOB: April 03, 1975, 39 y.o.   MRN: [id number]  Subjective:     Haim Kilpela is a 39 y.o. male who complains of skin tag. Skin tag present in low back for many years. The patient wishes skin tag removed as the lesion is getting caught on clothing and/or jewelry and is recurrently irritated.   History  Substance Use Topics  . Smoking status: Never Smoker   . Smokeless tobacco: Never Used  . Alcohol Use: No     Comment: "none really" - 08/27/13    Review of Systems Constitutional: negative for chills and fevers   Skin: no rash, tag present for many years    Objective:    Skin:  pedunculated skin tag in the low back region, removed using scissors and forceps after alcohol and iodoine prep. Skin anaesthetized with 2% lidocaine with epi; hemostasis is obtained with silver nitrate sticks and discarded.    Assessment:    Chronically irritated skin tag, removed.    Plan:     1. The patient is instructed to watch for signs of infection including erythema, pain,      purulent discharge, or crusting.  2. Written patient instruction given. 3. Follow up as needed for chronic disease management   RMA informed patient of normal labs from last OV.

## 2014-09-17 NOTE — Assessment & Plan Note (Signed)
Skin tag removed from low back

## 2014-09-17 NOTE — Patient Instructions (Signed)
Mr. Duane [name],  Thank you for coming in today Skin tag removed today Call for redness, swelling pain.   Keep f/u plan from last office visit  Dr. Adrian Blackwater

## 2014-09-17 NOTE — Progress Notes (Signed)
Removal of skin tag on lower back  No pain

## 2014-10-26 ENCOUNTER — Encounter: Payer: Self-pay | Admitting: Pharmacist

## 2014-10-26 ENCOUNTER — Telehealth: Payer: Self-pay | Admitting: Family Medicine

## 2014-10-26 NOTE — Telephone Encounter (Signed)
Patient called requesting an order for [employer] Glucometer along with the testing strips. Patient stated that their insurance was cancelled and needed the switch.

## 2014-11-02 ENCOUNTER — Encounter: Payer: Self-pay | Admitting: Pharmacist

## 2014-11-04 ENCOUNTER — Other Ambulatory Visit: Payer: Self-pay

## 2014-11-04 DIAGNOSIS — E1042 Type 1 diabetes mellitus with diabetic polyneuropathy: Secondary | ICD-10-CM

## 2014-11-04 MED ORDER — TRUE METRIX METER W/DEVICE KIT: 1.0000 | PACK | Freq: Three times a day (TID) | Status: DC

## 2014-11-04 MED ORDER — TRUEPLUS LANCETS 28G MISC: 1.0000 | Freq: Three times a day (TID) | Status: AC

## 2014-11-04 MED ORDER — GLUCOSE BLOOD VI STRP: ORAL_STRIP | Status: DC

## 2014-11-04 NOTE — Telephone Encounter (Signed)
Patient needs new order for glucometer, lancets and test strips per [facility] pharmacy, due to patient not having insurance any longer.

## 2014-11-05 ENCOUNTER — Other Ambulatory Visit: Payer: Self-pay | Admitting: *Deleted

## 2014-11-05 MED ORDER — TRUE METRIX METER W/DEVICE KIT: 1.0000 | PACK | Freq: Three times a day (TID) | Status: AC

## 2014-12-30 ENCOUNTER — Ambulatory Visit: Payer: Medicaid Other | Attending: Family Medicine | Admitting: Family Medicine

## 2014-12-30 ENCOUNTER — Encounter: Payer: Self-pay | Admitting: Family Medicine

## 2014-12-30 VITALS — BP 133/90 | HR 78 | Temp 98.3°F | Resp 16 | Ht 75.0 in | Wt 310.0 lb

## 2014-12-30 DIAGNOSIS — R197 Diarrhea, unspecified: Secondary | ICD-10-CM | POA: Insufficient documentation

## 2014-12-30 DIAGNOSIS — E1042 Type 1 diabetes mellitus with diabetic polyneuropathy: Secondary | ICD-10-CM | POA: Diagnosis not present

## 2014-12-30 DIAGNOSIS — Z794 Long term (current) use of insulin: Secondary | ICD-10-CM | POA: Insufficient documentation

## 2014-12-30 DIAGNOSIS — K529 Noninfective gastroenteritis and colitis, unspecified: Secondary | ICD-10-CM | POA: Diagnosis not present

## 2014-12-30 DIAGNOSIS — I10 Essential (primary) hypertension: Secondary | ICD-10-CM | POA: Insufficient documentation

## 2014-12-30 DIAGNOSIS — Z79899 Other long term (current) drug therapy: Secondary | ICD-10-CM | POA: Insufficient documentation

## 2014-12-30 DIAGNOSIS — E109 Type 1 diabetes mellitus without complications: Secondary | ICD-10-CM | POA: Insufficient documentation

## 2014-12-30 LAB — GLUCOSE, POCT (MANUAL RESULT ENTRY): POC GLUCOSE: 141 mg/dL — AB (ref 70–99)

## 2014-12-30 MED ORDER — METOPROLOL TARTRATE 50 MG PO TABS: 50.0000 mg | ORAL_TABLET | Freq: Two times a day (BID) | ORAL | Status: DC

## 2014-12-30 NOTE — Progress Notes (Signed)
Patient ID: Duane [name], male   DOB: 30-Dec-1975, 39 y.o.   MRN: [id number]   Subjective:  Patient ID: Duane [name], male    DOB: August 03, 1975  Age: 39 y.o. MRN: [id number]  CC: Diarrhea   HPI Fahim Kats presents for    1. Diarrhea: x 3 months.  Usually in early mornings. Watery to soft bowel movements. Last 4-5 hrs up to all day. No identified food triggers. Sometimes cheeses or dairy items cause pain.  Has hx of cholecystectomy. Diarrhea is associated with gas pain. Bloating. No fever. No blood in stool. Denies constipation. Has IDDM type 1 with gastroparesis. ? IBS. No family history of IBS/IBD or lactose intolerance.   Social History  Substance Use Topics  . Smoking status: Never Smoker   . Smokeless tobacco: Never Used  . Alcohol Use: No     Comment: "none really" - 08/27/13    Outpatient Prescriptions Prior to Visit  Medication Sig Dispense Refill  . Blood Glucose Monitoring Suppl (TRUE METRIX METER) W/DEVICE KIT 1 each by Does not apply route 4 (four) times daily -  before meals and at bedtime. 1 kit ea  . gabapentin (NEURONTIN) 300 MG capsule Take 1 capsule (300 mg total) by mouth at bedtime. 30 capsule 3  . glucose blood (TRUE METRIX BLOOD GLUCOSE TEST) test strip Use as instructed 100 each 12  . insulin aspart (NOVOLOG) 100 UNIT/ML FlexPen Inject 4 Units into the skin 3 (three) times daily with meals. (as per sliding scale) 15 mL 3  . insulin glargine (LANTUS) 100 UNIT/ML injection Inject 0.25 mLs (25 Units total) into the skin at bedtime. 10 mL 3  . Insulin Pen Needle 31G X 8 MM MISC Inject 1 each into the skin 3 (three) times daily. Check blood sugar TID and QHS 100 each 3  . metoCLOPramide (REGLAN) 10 MG tablet Take 1 tablet (10 mg total) by mouth 4 (four) times daily -  before meals and at bedtime. For 1 week then as needed 120 tablet 3  . metoprolol (LOPRESSOR) 25 MG tablet Take 1 tablet (25 mg total) by mouth 2 (two) times daily. 60 tablet 2  . pantoprazole (PROTONIX)  40 MG tablet Take 1 tablet (40 mg total) by mouth 2 (two) times daily. 60 tablet 3  . promethazine (PHENERGAN) 25 MG tablet Take 1 tablet (25 mg total) by mouth every 8 (eight) hours as needed for nausea or vomiting. 60 tablet 3  . sucralfate (CARAFATE) 1 G tablet Take 1 tablet (1 g total) by mouth 3 (three) times daily with meals. 90 tablet 3  . TRUEPLUS LANCETS 28G MISC 1 each by Does not apply route 4 (four) times daily -  before meals and at bedtime. 100 each 12  . lisinopril (PRINIVIL,ZESTRIL) 2.5 MG tablet Take 2 tablets (5 mg total) by mouth daily. (Patient not taking: Reported on 12/30/2014) 30 tablet 2   No facility-administered medications prior to visit.    ROS Review of Systems  Constitutional: Negative for fever, chills, fatigue and unexpected weight change.  Eyes: Negative for visual disturbance.  Respiratory: Negative for cough and shortness of breath.   Cardiovascular: Negative for chest pain, palpitations and leg swelling.  Gastrointestinal: Positive for abdominal pain and diarrhea. Negative for nausea, vomiting, constipation and blood in stool.  Endocrine: Negative for polydipsia, polyphagia and polyuria.  Musculoskeletal: Positive for joint swelling and arthralgias. Negative for myalgias, back pain, gait problem and neck pain.  Skin: Negative for rash.  Allergic/Immunologic: Negative for  immunocompromised state.  Hematological: Negative for adenopathy. Does not bruise/bleed easily.  Psychiatric/Behavioral: Negative for suicidal ideas, sleep disturbance and dysphoric mood. The patient is not nervous/anxious.     Objective:  BP 133/90 mmHg  Pulse 78  Temp(Src) 98.3 F (36.8 C) (Oral)  Resp 16  Ht 6' 3"  (1.905 m)  Wt 310 lb (140.615 kg)  BMI 38.75 kg/m2  SpO2 98%  BP/Weight 12/30/2014 07/15/2295 10/20/9209  Systolic BP 941 740 814  Diastolic BP 90 87 89  Wt. (Lbs) 310 284 283  BMI 38.75 35.5 35.37    Physical Exam  Constitutional: He appears well-developed and  well-nourished. No distress.  HENT:  Head: Normocephalic and atraumatic.  Neck: Normal range of motion. Neck supple.  Cardiovascular: Normal rate, regular rhythm, normal heart sounds and intact distal pulses.   Pulmonary/Chest: Effort normal and breath sounds normal.  Musculoskeletal: He exhibits no edema.  Neurological: He is alert.  Skin: Skin is warm and dry. No rash noted. No erythema.  Psychiatric: He has a normal mood and affect.    Assessment & Plan:   Problem List Items Addressed This Visit    Chronic diarrhea (Chronic)   DM type 1 (diabetes mellitus, type 1) (HCC) - Primary (Chronic)   Relevant Orders   POCT glucose (manual entry) (Completed)   HTN (hypertension) (Chronic)   Relevant Medications   metoprolol tartrate (LOPRESSOR) 50 MG tablet      No orders of the defined types were placed in this encounter.    Follow-up: No Follow-up on file.   Boykin Nearing MD

## 2014-12-30 NOTE — Patient Instructions (Addendum)
[name] was seen today for diarrhea.  Diagnoses and all orders for this visit:  Type 1 diabetes mellitus with diabetic polyneuropathy (HCC) -     POCT glucose (manual entry)  Chronic diarrhea  Essential hypertension -     metoprolol tartrate (LOPRESSOR) 50 MG tablet; Take 1 tablet (50 mg total) by mouth 2 (two) times daily.   For chronic diarrhea: IBS is possible  Start with FODMAP diet Add cholestyramine is an option A GI appointment is ideal  F/u in 3-4 weeks for L knee pain, I do injections   Dr. Adrian Blackwater

## 2014-12-30 NOTE — Progress Notes (Signed)
C/C stomach pressure, diarrhea, abdominal pain No pain today  No hx tobacco user  No suicidal thought in the past two week

## 2014-12-30 NOTE — Assessment & Plan Note (Signed)
A: chronic diarrhea. Possible IBS with diarrhea P: FODMAP diet Consider adding cholestyramine GI referral once patient insured

## 2015-01-01 ENCOUNTER — Observation Stay (HOSPITAL_COMMUNITY): Payer: Medicaid Other

## 2015-01-01 ENCOUNTER — Encounter (HOSPITAL_COMMUNITY): Payer: Self-pay | Admitting: Emergency Medicine

## 2015-01-01 ENCOUNTER — Inpatient Hospital Stay (HOSPITAL_COMMUNITY)
Admission: EM | Admit: 2015-01-01 | Discharge: 2015-01-05 | DRG: 074 | Disposition: A | Discharge status: Home / Self Care | Payer: Self-pay | Attending: Internal Medicine | Admitting: Internal Medicine

## 2015-01-01 DIAGNOSIS — E669 Obesity, unspecified: Secondary | ICD-10-CM | POA: Diagnosis present

## 2015-01-01 DIAGNOSIS — K2970 Gastritis, unspecified, without bleeding: Secondary | ICD-10-CM | POA: Diagnosis present

## 2015-01-01 DIAGNOSIS — K582 Mixed irritable bowel syndrome: Secondary | ICD-10-CM | POA: Diagnosis present

## 2015-01-01 DIAGNOSIS — Z833 Family history of diabetes mellitus: Secondary | ICD-10-CM

## 2015-01-01 DIAGNOSIS — E1043 Type 1 diabetes mellitus with diabetic autonomic (poly)neuropathy: Principal | ICD-10-CM | POA: Diagnosis present

## 2015-01-01 DIAGNOSIS — K3184 Gastroparesis: Secondary | ICD-10-CM | POA: Diagnosis present

## 2015-01-01 DIAGNOSIS — I10 Essential (primary) hypertension: Secondary | ICD-10-CM | POA: Diagnosis present

## 2015-01-01 DIAGNOSIS — Z9049 Acquired absence of other specified parts of digestive tract: Secondary | ICD-10-CM

## 2015-01-01 DIAGNOSIS — E1065 Type 1 diabetes mellitus with hyperglycemia: Secondary | ICD-10-CM | POA: Diagnosis present

## 2015-01-01 DIAGNOSIS — R1115 Cyclical vomiting syndrome unrelated to migraine: Secondary | ICD-10-CM

## 2015-01-01 DIAGNOSIS — E1042 Type 1 diabetes mellitus with diabetic polyneuropathy: Secondary | ICD-10-CM

## 2015-01-01 DIAGNOSIS — E109 Type 1 diabetes mellitus without complications: Secondary | ICD-10-CM | POA: Diagnosis present

## 2015-01-01 DIAGNOSIS — Z794 Long term (current) use of insulin: Secondary | ICD-10-CM

## 2015-01-01 DIAGNOSIS — R7989 Other specified abnormal findings of blood chemistry: Secondary | ICD-10-CM | POA: Diagnosis present

## 2015-01-01 DIAGNOSIS — K219 Gastro-esophageal reflux disease without esophagitis: Secondary | ICD-10-CM | POA: Diagnosis present

## 2015-01-01 DIAGNOSIS — F329 Major depressive disorder, single episode, unspecified: Secondary | ICD-10-CM | POA: Diagnosis present

## 2015-01-01 DIAGNOSIS — Z6838 Body mass index (BMI) 38.0-38.9, adult: Secondary | ICD-10-CM

## 2015-01-01 LAB — COMPREHENSIVE METABOLIC PANEL
ALBUMIN: 4.3 g/dL (ref 3.5–5.0)
ALK PHOS: 113 U/L (ref 38–126)
ALT: 42 U/L (ref 17–63)
AST: 26 U/L (ref 15–41)
Anion gap: 11 (ref 5–15)
BILIRUBIN TOTAL: 0.4 mg/dL (ref 0.3–1.2)
BUN: 12 mg/dL (ref 6–20)
CO2: 25 mmol/L (ref 22–32)
CREATININE: 1.38 mg/dL — AB (ref 0.61–1.24)
Calcium: 9.3 mg/dL (ref 8.9–10.3)
Chloride: 102 mmol/L (ref 101–111)
GFR calc Af Amer: >60 mL/min (ref >60)
GLUCOSE: 240 mg/dL — AB (ref 65–99)
POTASSIUM: 3.9 mmol/L (ref 3.5–5.1)
Sodium: 138 mmol/L (ref 135–145)
TOTAL PROTEIN: 7.5 g/dL (ref 6.5–8.1)

## 2015-01-01 LAB — CBC
HEMATOCRIT: 41.7 % (ref 39.0–52.0)
Hemoglobin: 14 g/dL (ref 13.0–17.0)
MCH: 27.2 pg (ref 26.0–34.0)
MCHC: 33.6 g/dL (ref 30.0–36.0)
MCV: 81.1 fL (ref 78.0–100.0)
Platelets: 267 10*3/uL (ref 150–400)
RBC: 5.14 MIL/uL (ref 4.22–5.81)
RDW: 12.6 % (ref 11.5–15.5)
WBC: 7.7 10*3/uL (ref 4.0–10.5)

## 2015-01-01 LAB — GLUCOSE, CAPILLARY
GLUCOSE-CAPILLARY: 228 mg/dL — AB (ref 65–99)
GLUCOSE-CAPILLARY: 228 mg/dL — AB (ref 65–99)
GLUCOSE-CAPILLARY: 148 mg/dL — AB (ref 65–99)
Glucose-Capillary: 137 mg/dL — ABNORMAL HIGH (ref 65–99)
Glucose-Capillary: 182 mg/dL — ABNORMAL HIGH (ref 65–99)

## 2015-01-01 LAB — URINALYSIS, ROUTINE W REFLEX MICROSCOPIC
Bilirubin Urine: NEGATIVE
Ketones, ur: NEGATIVE mg/dL
LEUKOCYTES UA: NEGATIVE
Nitrite: NEGATIVE
PH: 7 (ref 5.0–8.0)
Protein, ur: 30 mg/dL — AB
Specific Gravity, Urine: 1.016 (ref 1.005–1.030)

## 2015-01-01 LAB — CBG MONITORING, ED: Glucose-Capillary: 235 mg/dL — ABNORMAL HIGH (ref 65–99)

## 2015-01-01 LAB — LIPASE, BLOOD: LIPASE: 27 U/L (ref 11–51)

## 2015-01-01 LAB — URINE MICROSCOPIC-ADD ON

## 2015-01-01 MED ORDER — PROMETHAZINE HCL 25 MG/ML IJ SOLN
25.0000 mg | Freq: Once | INTRAMUSCULAR | Status: DC | PRN
  Administered 2015-01-01: 25 mg via INTRAMUSCULAR
  Filled 2015-01-01 (×2): qty 1

## 2015-01-01 MED ORDER — SODIUM CHLORIDE 0.9 % IV BOLUS (SEPSIS)
1000.0000 mL | Freq: Once | INTRAVENOUS | Status: AC
  Administered 2015-01-01: 1000 mL via INTRAVENOUS

## 2015-01-01 MED ORDER — PROMETHAZINE HCL 25 MG/ML IJ SOLN
25.0000 mg | Freq: Once | INTRAMUSCULAR | Status: AC
  Administered 2015-01-01: 25 mg via INTRAVENOUS
  Filled 2015-01-01: qty 1

## 2015-01-01 MED ORDER — METOCLOPRAMIDE HCL 5 MG/ML IJ SOLN
10.0000 mg | Freq: Once | INTRAMUSCULAR | Status: AC
  Administered 2015-01-01: 10 mg via INTRAVENOUS
  Filled 2015-01-01: qty 2

## 2015-01-01 MED ORDER — METOCLOPRAMIDE HCL 5 MG/ML IJ SOLN: 10.0000 mg | Freq: Four times a day (QID) | INTRAMUSCULAR | Status: DC

## 2015-01-01 MED ORDER — ONDANSETRON HCL 4 MG/2ML IJ SOLN
4.0000 mg | Freq: Once | INTRAMUSCULAR | Status: AC
  Administered 2015-01-01: 4 mg via INTRAVENOUS
  Filled 2015-01-01: qty 2

## 2015-01-01 MED ORDER — DIPHENHYDRAMINE HCL 50 MG/ML IJ SOLN
25.0000 mg | Freq: Once | INTRAMUSCULAR | Status: AC
  Administered 2015-01-01: 25 mg via INTRAVENOUS
  Filled 2015-01-01: qty 1

## 2015-01-01 MED ORDER — SODIUM CHLORIDE 0.9 % IV SOLN
500.0000 mg | Freq: Three times a day (TID) | INTRAVENOUS | Status: DC
  Administered 2015-01-01 – 2015-01-05 (×12): 500 mg via INTRAVENOUS
  Filled 2015-01-01 (×15): qty 10

## 2015-01-01 MED ORDER — SODIUM CHLORIDE 0.9 % IV SOLN
INTRAVENOUS | Status: DC
  Administered 2015-01-01 – 2015-01-05 (×7): via INTRAVENOUS

## 2015-01-01 MED ORDER — PROMETHAZINE HCL 25 MG/ML IJ SOLN
25.0000 mg | Freq: Four times a day (QID) | INTRAMUSCULAR | Status: DC | PRN
  Administered 2015-01-01 – 2015-01-04 (×9): 25 mg via INTRAVENOUS
  Filled 2015-01-01 (×9): qty 1

## 2015-01-01 MED ORDER — METOPROLOL TARTRATE 1 MG/ML IV SOLN
5.0000 mg | Freq: Three times a day (TID) | INTRAVENOUS | Status: DC
  Administered 2015-01-01 – 2015-01-05 (×11): 5 mg via INTRAVENOUS
  Filled 2015-01-01 (×11): qty 5

## 2015-01-01 MED ORDER — GABAPENTIN 300 MG PO CAPS
300.0000 mg | ORAL_CAPSULE | Freq: Every day | ORAL | Status: DC
  Administered 2015-01-01 – 2015-01-04 (×4): 300 mg via ORAL
  Filled 2015-01-01 (×4): qty 1

## 2015-01-01 MED ORDER — METOPROLOL TARTRATE 50 MG PO TABS: 50.0000 mg | ORAL_TABLET | Freq: Two times a day (BID) | ORAL | Status: DC

## 2015-01-01 MED ORDER — INSULIN ASPART 100 UNIT/ML ~~LOC~~ SOLN
0.0000 [IU] | SUBCUTANEOUS | Status: DC
  Administered 2015-01-01: 2 [IU] via SUBCUTANEOUS
  Administered 2015-01-01: 1 [IU] via SUBCUTANEOUS
  Administered 2015-01-01: 3 [IU] via SUBCUTANEOUS
  Administered 2015-01-02 (×3): 1 [IU] via SUBCUTANEOUS
  Administered 2015-01-02: 2 [IU] via SUBCUTANEOUS
  Administered 2015-01-02 – 2015-01-03 (×2): 1 [IU] via SUBCUTANEOUS
  Administered 2015-01-03 – 2015-01-04 (×4): 2 [IU] via SUBCUTANEOUS
  Administered 2015-01-04: 1 [IU] via SUBCUTANEOUS
  Administered 2015-01-05: 2 [IU] via SUBCUTANEOUS

## 2015-01-01 MED ORDER — SUCRALFATE 1 G PO TABS
1.0000 g | ORAL_TABLET | Freq: Three times a day (TID) | ORAL | Status: DC
  Administered 2015-01-01: 1 g via ORAL
  Filled 2015-01-01 (×4): qty 1

## 2015-01-01 MED ORDER — HYDRALAZINE HCL 20 MG/ML IJ SOLN
10.0000 mg | INTRAMUSCULAR | Status: DC | PRN
  Administered 2015-01-01: 10 mg via INTRAVENOUS
  Filled 2015-01-01 (×2): qty 1

## 2015-01-01 MED ORDER — METOCLOPRAMIDE HCL 5 MG/ML IJ SOLN
10.0000 mg | Freq: Four times a day (QID) | INTRAMUSCULAR | Status: DC
  Administered 2015-01-01 – 2015-01-02 (×4): 10 mg via INTRAVENOUS
  Filled 2015-01-01 (×4): qty 2

## 2015-01-01 MED ORDER — INSULIN GLARGINE 100 UNIT/ML ~~LOC~~ SOLN
20.0000 [IU] | Freq: Every day | SUBCUTANEOUS | Status: DC
  Administered 2015-01-01 – 2015-01-04 (×3): 20 [IU] via SUBCUTANEOUS
  Filled 2015-01-01 (×5): qty 0.2

## 2015-01-01 MED ORDER — SODIUM CHLORIDE 0.9 % IJ SOLN
10.0000 mL | Freq: Two times a day (BID) | INTRAMUSCULAR | Status: DC
Start: 2015-01-01 — End: 2015-01-05
  Administered 2015-01-02 – 2015-01-03 (×3): 10 mL

## 2015-01-01 MED ORDER — HEPARIN SODIUM (PORCINE) 5000 UNIT/ML IJ SOLN
5000.0000 [IU] | Freq: Three times a day (TID) | INTRAMUSCULAR | Status: DC
  Administered 2015-01-01 – 2015-01-05 (×13): 5000 [IU] via SUBCUTANEOUS
  Filled 2015-01-01 (×12): qty 1

## 2015-01-01 MED ORDER — MORPHINE SULFATE (PF) 4 MG/ML IV SOLN
4.0000 mg | Freq: Once | INTRAVENOUS | Status: AC
  Administered 2015-01-01: 4 mg via INTRAVENOUS
  Filled 2015-01-01: qty 1

## 2015-01-01 MED ORDER — HALOPERIDOL LACTATE 5 MG/ML IJ SOLN
2.0000 mg | Freq: Once | INTRAMUSCULAR | Status: AC
  Administered 2015-01-01: 2 mg via INTRAVENOUS
  Filled 2015-01-01: qty 1

## 2015-01-01 MED ORDER — PANTOPRAZOLE SODIUM 40 MG IV SOLR
40.0000 mg | Freq: Two times a day (BID) | INTRAVENOUS | Status: DC
  Administered 2015-01-01 – 2015-01-04 (×7): 40 mg via INTRAVENOUS
  Filled 2015-01-01 (×7): qty 40

## 2015-01-01 MED ORDER — MORPHINE SULFATE (PF) 2 MG/ML IV SOLN: 2.0000 mg | INTRAVENOUS | Status: DC | PRN

## 2015-01-01 MED ORDER — MORPHINE SULFATE (PF) 2 MG/ML IV SOLN
1.0000 mg | INTRAVENOUS | Status: DC | PRN
  Administered 2015-01-01 – 2015-01-03 (×9): 1 mg via INTRAVENOUS
  Filled 2015-01-01 (×9): qty 1

## 2015-01-01 MED ORDER — PANTOPRAZOLE SODIUM 40 MG PO TBEC: 40.0000 mg | DELAYED_RELEASE_TABLET | Freq: Two times a day (BID) | ORAL | Status: DC

## 2015-01-01 MED ORDER — SODIUM CHLORIDE 0.9 % IJ SOLN
10.0000 mL | INTRAMUSCULAR | Status: DC | PRN
  Administered 2015-01-03 – 2015-01-05 (×2): 10 mL
  Filled 2015-01-01 (×2): qty 40

## 2015-01-01 NOTE — Consult Note (Signed)
Consultation  Referring Provider: [facility] Primary Care Physician:  Minerva Ends, MD Primary Gastroenterologist:  Dr.Gessner  Reason for Consultation:  Intractable nausea/vomiting/gastroparesis  HPI: Duane [name] is a 39 y.o. male known to Dr. Carlean Purl who was admitted through the emergency room late last evening after presenting with intractable nausea and vomiting.  Patient is a type I diabetic and has previously documented diabetic gastroparesis.  He had undergone gastric emptying scan in July 2015 which showed 100% retention at one hour and at 2 hours.  He had EGD in April 2015 with a medium hiatal hernia otherwise negative exam.  He has been on a regimen of twice a day PPI and metoclopramide 10 mg 4 times daily. He uses Phenergan when necessary in tablet form, does not have suppositories. Labs from ER reviewed and unremarkable including lipase. Patient says he had been doing fine recently and had acute onset yesterday morning with mild abdominal discomfort which she says is typical of his flares of gastroparesis. This increased during the day and he began having nausea and vomiting which became refractory to home meds. He denies any recent changes to his regimen, says he has been taking his medications as instructed. He does say that his blood sugars have been running on the high side over 300 frequently. He also has history of C. difficile and mentions that he has been having diarrhea over the past few months. He says frequently this will be more than 10 bowel movements per day. He has not had any increase in diarrhea over the past week no fever chills etc. No recent antibiotics. Measures in the emergency room with IV anti-emetics ,prokinetics, and Haldol all without much improvement in symptoms.    Past Medical History  Diagnosis Date  . Hypertension Dx 2012  . Diabetes mellitus without complication [facility]) age 38    insulin requiring from the start  . Gastritis   .  Gastroparesis 2012    100% gastric retention on 08/2013 GES  . Obesity   . Diabetic neuropathy ([place]) Dx 20110  . GERD (gastroesophageal reflux disease) Dx 2012  . Depression     Past Surgical History  Procedure Laterality Date  . Cholecystectomy  2013  . Knee surgery Left   . Flexible sigmoidoscopy N/A 06/11/2013    Procedure: FLEXIBLE SIGMOIDOSCOPY;  Surgeon: [name], MD;  Location: [facility];  Service: Endoscopy;  Laterality: N/A;  . Esophagogastroduodenoscopy N/A 06/11/2013    Procedure: ESOPHAGOGASTRODUODENOSCOPY (EGD);  Surgeon: [name], MD;  Location: [facility];  Service: Endoscopy;  Laterality: N/A;    Prior to Admission medications   Medication Sig Start Date End Date Taking? Authorizing Provider  Blood Glucose Monitoring Suppl (TRUE METRIX METER) W/DEVICE KIT 1 each by Does not apply route 4 (four) times daily -  before meals and at bedtime. 11/05/14   Josalyn Funches, MD  gabapentin (NEURONTIN) 300 MG capsule Take 1 capsule (300 mg total) by mouth at bedtime. 09/13/14   Josalyn Funches, MD  glucose blood (TRUE METRIX BLOOD GLUCOSE TEST) test strip Use as instructed 11/04/14   Boykin Nearing, MD  insulin aspart (NOVOLOG) 100 UNIT/ML FlexPen Inject 4 Units into the skin 3 (three) times daily with meals. (as per sliding scale) 07/26/14   Arnoldo Morale, MD  insulin glargine (LANTUS) 100 UNIT/ML injection Inject 0.25 mLs (25 Units total) into the skin at bedtime. 07/07/14   Arnoldo Morale, MD  Insulin Pen Needle 31G X 8 MM MISC Inject 1 each into  the skin 3 (three) times daily. Check blood sugar TID and QHS 07/26/14   Arnoldo Morale, MD  metoCLOPramide (REGLAN) 10 MG tablet Take 1 tablet (10 mg total) by mouth 4 (four) times daily -  before meals and at bedtime. For 1 week then as needed 07/07/14   Arnoldo Morale, MD  metoprolol tartrate (LOPRESSOR) 50 MG tablet Take 1 tablet (50 mg total) by mouth 2 (two) times daily. 12/30/14   Josalyn Funches, MD  pantoprazole (PROTONIX) 40 MG  tablet Take 1 tablet (40 mg total) by mouth 2 (two) times daily. 07/07/14   Arnoldo Morale, MD  promethazine (PHENERGAN) 25 MG tablet Take 1 tablet (25 mg total) by mouth every 8 (eight) hours as needed for nausea or vomiting. 07/07/14   Arnoldo Morale, MD  sucralfate (CARAFATE) 1 G tablet Take 1 tablet (1 g total) by mouth 3 (three) times daily with meals. 07/07/14   Arnoldo Morale, MD  TRUEPLUS LANCETS 28G MISC 1 each by Does not apply route 4 (four) times daily -  before meals and at bedtime. 11/04/14   Boykin Nearing, MD    Current Facility-Administered Medications  Medication Dose Route Frequency Provider Last Rate Last Dose  . 0.9 %  sodium chloride infusion   Intravenous Continuous Etta Quill, DO 100 mL/hr at 01/01/15 6213    . erythromycin 500 mg in sodium chloride 0.9 % 100 mL IVPB  500 mg Intravenous 3 times per day Jonetta Osgood, MD      . gabapentin (NEURONTIN) capsule 300 mg  300 mg Oral QHS Etta Quill, DO      . heparin injection 5,000 Units  5,000 Units Subcutaneous 3 times per day Etta Quill, DO   5,000 Units at 01/01/15 0865  . hydrALAZINE (APRESOLINE) injection 10 mg  10 mg Intravenous Q4H PRN Etta Quill, DO   10 mg at 01/01/15 0620  . insulin aspart (novoLOG) injection 0-9 Units  0-9 Units Subcutaneous 6 times per day Etta Quill, DO   3 Units at 01/01/15 0710  . insulin glargine (LANTUS) injection 20 Units  20 Units Subcutaneous QHS Etta Quill, DO      . metoCLOPramide (REGLAN) injection 10 mg  10 mg Intravenous 4 times per day Etta Quill, DO   10 mg at 01/01/15 7846  . metoprolol tartrate (LOPRESSOR) tablet 50 mg  50 mg Oral BID Etta Quill, DO      . morphine 2 MG/ML injection 1 mg  1 mg Intravenous Q4H PRN Jonetta Osgood, MD      . pantoprazole (PROTONIX) injection 40 mg  40 mg Intravenous Q12H Shanker Kristeen Mans, MD      . promethazine (PHENERGAN) injection 25 mg  25 mg Intravenous Q6H PRN Etta Quill, DO      . promethazine  (PHENERGAN) injection 25 mg  25 mg Intramuscular Once PRN Jonetta Osgood, MD   25 mg at 01/01/15 1225  . sucralfate (CARAFATE) tablet 1 g  1 g Oral TID WC Etta Quill, DO   1 g at 01/01/15 0830    Allergies as of 01/01/2015  . (No Known Allergies)    Family History  Problem Relation Age of Onset  . Diabetes Father   . Diabetes Maternal Grandmother   . Diabetes Maternal Grandfather     Social History   Social History  . Marital Status: Single    Spouse Name: N/A  . Number of Children: N/A  .  Years of Education: N/A   Occupational History  . 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    Social History Main Topics  . Smoking status: Never Smoker   . Smokeless tobacco: Never Used  . Alcohol Use: No  . Drug Use: No  . Sexual Activity: Not on file   Other Topics Concern  . Not on file   Social History Narrative    Review of Systems: Pertinent positive and negative review of systems were noted in the above HPI section.  All other review of systems was otherwise negative. Physical Exam: Vital signs in last 24 hours: Temp:  [98.1 F (36.7 C)-98.8 F (37.1 C)] 98.8 F (37.1 C) (11/19 1057) Pulse Rate:  [93-106] 101 (11/19 1057) Resp:  [16-32] 18 (11/19 1057) BP: (163-192)/(74-110) 181/104 mmHg (11/19 1057) SpO2:  [93 %-98 %] 98 % (11/19 1057)   General:   Alert,  Well-developed, well-nourished,AA male , pleasant and cooperative in NAD-vomiting Head:  Normocephalic and atraumatic. Eyes:  Sclera clear, no icterus.   Conjunctiva pink. Ears:  Normal auditory acuity. Nose:  No deformity, discharge,  or lesions. Mouth:  No deformity or lesions.   Neck:  Supple; no masses or thyromegaly. Lungs:  Clear throughout to auscultation.   No wheezes, crackles, or rhonchi. Heart:  Regular rate and rhythm; no murmurs, clicks, rubs,  or gallops. Abdomen:  Soft,mildly tender epigastrium , BS active,nonpalp mass or hsm.  , BS+ Rectal:  Deferred  Msk:   Symmetrical without gross deformities. . Pulses:  Normal pulses noted. Extremities:  Without clubbing or edema. Neurologic:  Alert and  oriented x4;  grossly normal neurologically. Skin:  Intact without significant lesions or rashes.. Psych:  Alert and cooperative. Normal mood and affect.  Intake/Output from previous day: 11/18 0701 - 11/19 0700 In: 2000 [I.V.:2000] Out: 1075 [Urine:1075] Intake/Output this shift:    Lab Results:  Recent Labs  01/01/15 0119  WBC 7.7  HGB 14.0  HCT 41.7  PLT 267   BMET  Recent Labs  01/01/15 0119  NA 138  K 3.9  CL 102  CO2 25  GLUCOSE 240*  BUN 12  CREATININE 1.38*  CALCIUM 9.3   LFT  Recent Labs  01/01/15 0119  PROT 7.5  ALBUMIN 4.3  AST 26  ALT 42  ALKPHOS 113  BILITOT 0.4   PT/INR No results for input(s): LABPROT, INR in the last 72 hours. Hepatitis Panel No results for input(s): HEPBSAG, HCVAB, HEPAIGM, HEPBIGM in the last 72 hours.    IMPRESSION:   #37 39 year old male insulin-dependent diabetic with previously documented diabetic gastroparesis currently admitted with intractable nausea and vomiting 24 hours. Current symptoms consistent with his usual exacerbations of gastroparesis. He has been compliant with meds ,though  diabetes not under good control. #2 persistent diarrhea, patient with prior history of C. difficile, doubt C. difficile currently but will rule out he may have a component of chronic diarrhea secondary to visceral neuropathy and/or medications #3 status post cholecystectomy #4 history of depression    PLAN:  #1 nothing by mouth for now with ice chips #2  hydrate #3 round-the-clock anti-emetics and around-the-clock metoclopramide #4 IV erythromycin has been ordered- hesitant with hx of Cdiff #5 C. difficile PCR #6 hemoglobin A1c  We will follow with you, management will be supportive.   Braydn Carneiro  01/01/2015, 12:28 PM

## 2015-01-01 NOTE — Progress Notes (Signed)
PATIENT DETAILS Name: Duane [name] Age: 39 y.o. Sex: male Date of Birth: February 04, 1976 Admit Date: 01/01/2015 Admitting Physician Etta Quill, DO HC:[id number], Lennox Laity, MD  Subjective: Continues to have profuse nausea vomiting.  Assessment/Plan: Principal Problem: Diabetic gastroparesis  flare: Continues to have persistent vomiting, continue with scheduled Reglan. Add IV erythromycin.Minimize IV narcotics-Morphine dose decreased to 1 mg.Abdomen is soft without any acute findings-x-ray abdomen negative for acute abnormalities as well. GI consulted. Follow clinical course  Active Problems: DM type 1 (diabetes mellitus, type 1):CBGs mildly elevated-but patient nothing by mouth--Continue Lantus 20 units and SSI-follow and adjust accordingly  HTN (hypertension): since nothing by mouth-we will start IV metoprolol 5 mg every 8 hours, continue IV hydralazine as needed. We will follow and adjust accordingly  GERD: Continue PPI  Disposition: Remain inpatient  Antimicrobial agents  See below  Anti-infectives    Start     Dose/Rate Route Frequency Ordered Stop   01/01/15 1400  erythromycin 500 mg in sodium chloride 0.9 % 100 mL IVPB     500 mg 100 mL/hr over 60 Minutes Intravenous 3 times per day 01/01/15 1207        DVT Prophylaxis: Prophylactic Heparin   Code Status: Full code   Family Communication None at bedside  Procedures: None  CONSULTS:  GI  Time spent 30 minutes-Greater than 50% of this time was spent in counseling, explanation of diagnosis, planning of further management, and coordination of care.  MEDICATIONS: Scheduled Meds: . erythromycin  500 mg Intravenous 3 times per day  . gabapentin  300 mg Oral QHS  . heparin  5,000 Units Subcutaneous 3 times per day  . insulin aspart  0-9 Units Subcutaneous 6 times per day  . insulin glargine  20 Units Subcutaneous QHS  . metoCLOPramide (REGLAN) injection  10 mg Intravenous 4 times per day    . metoprolol tartrate  50 mg Oral BID  . pantoprazole (PROTONIX) IV  40 mg Intravenous Q12H  . sodium chloride  10-40 mL Intracatheter Q12H  . sucralfate  1 g Oral TID WC   Continuous Infusions: . sodium chloride 100 mL/hr at 01/01/15 0653   PRN Meds:.hydrALAZINE, morphine injection, promethazine, promethazine, sodium chloride    PHYSICAL EXAM: Vital signs in last 24 hours: Filed Vitals:   01/01/15 0615 01/01/15 0620 01/01/15 0655 01/01/15 1057  BP: 184/109 184/109 184/74 181/104  Pulse: 104  106 101  Temp:   98.4 F (36.9 C) 98.8 F (37.1 C)  TempSrc:   Oral Oral  Resp:   18 18  SpO2: 95%  97% 98%    Weight change:  There were no vitals filed for this visit. There is no weight on file to calculate BMI.   Gen Exam: Awake and alert with clear speech.   Neck: Supple, No JVD.  Chest: B/L Clear.   CVS: S1 S2 Regular, no murmurs.  Abdomen: soft, BS +, non tender, non distended.  Extremities: no edema, lower extremities warm to touch Neurologic: Non Focal.   Skin: No Rash.   Wounds: N/A.    Intake/Output from previous day:  Intake/Output Summary (Last 24 hours) at 01/01/15 1403 Last data filed at 01/01/15 0538  Gross per 24 hour  Intake   2000 ml  Output   1075 ml  Net    925 ml     LAB RESULTS: CBC  Recent Labs Lab 01/01/15 0119  WBC 7.7  HGB 14.0  HCT 41.7  PLT 267  MCV 81.1  MCH 27.2  MCHC 33.6  RDW 12.6    Chemistries   Recent Labs Lab Jan 05, 2015 0119  NA 138  K 3.9  CL 102  CO2 25  GLUCOSE 240*  BUN 12  CREATININE 1.38*  CALCIUM 9.3    CBG:  Recent Labs Lab 01/05/15 0126 01/05/15 0702 January 05, 2015 1152  GLUCAP 235* 228* 228*    GFR Estimated Creatinine Clearance: 109.7 mL/min (by C-G formula based on Cr of 1.38).  Coagulation profile No results for input(s): INR, PROTIME in the last 168 hours.  Cardiac Enzymes No results for input(s): CKMB, TROPONINI, MYOGLOBIN in the last 168 hours.  Invalid input(s): CK  Invalid  input(s): POCBNP No results for input(s): DDIMER in the last 72 hours. No results for input(s): HGBA1C in the last 72 hours. No results for input(s): CHOL, HDL, LDLCALC, TRIG, CHOLHDL, LDLDIRECT in the last 72 hours. No results for input(s): TSH, T4TOTAL, T3FREE, THYROIDAB in the last 72 hours.  Invalid input(s): FREET3 No results for input(s): VITAMINB12, FOLATE, FERRITIN, TIBC, IRON, RETICCTPCT in the last 72 hours.  Recent Labs  01/05/15 0119  LIPASE 27    Urine Studies No results for input(s): UHGB, CRYS in the last 72 hours.  Invalid input(s): UACOL, UAPR, USPG, UPH, UTP, UGL, UKET, UBIL, UNIT, UROB, ULEU, UEPI, UWBC, URBC, UBAC, CAST, UCOM, BILUA  MICROBIOLOGY: No results found for this or any previous visit (from the past 240 hour(s)).  RADIOLOGY STUDIES/RESULTS: Dg Abd 2 Views  2015/01/05  CLINICAL DATA:  Persistent abdominal pain and vomiting since yesterday. History of hypertension and diabetes. EXAM: ABDOMEN - 2 VIEW COMPARISON:  06/29/2014 FINDINGS: No bowel dilation is seen to suggest obstruction. There is no adynamic ileus or free air. Mild colonic stool burden is noted. Status post cholecystectomy, stable. No evidence of renal or ureteral stones. Bony structures are unremarkable. IMPRESSION: 1. No acute findings. No evidence of bowel obstruction, generalized adynamic ileus or free air. Electronically Signed   By: Lajean Manes M.D.   On: 05-Jan-2015 10:21    Oren Binet, MD  [facility] Pager:[phone]  If 7PM-7AM, please contact night-coverage [email or web address] Password [email or web address] January 05, 2015, 2:03 PM

## 2015-01-01 NOTE — Progress Notes (Signed)
Received patient from ED. Patient AOx3, ambulatory but had intermittent vomiting. BP elevated at 184/74, PR106, afebrile, O2Sat=97 at RA. Patient oriented to room, bed controls and call light.  Patient had earlier doses of hydralazine, phenergan, reglan, morphine, and benadryl at ED.  Will endorse to day shift nurse appropriately.

## 2015-01-01 NOTE — H&P (Signed)
[facility] History and Physical  Duane [name] MRN:[id number] DOB: 11/17/1975 DOA: 01/01/2015  Referring physician: EDP PCP: [name], Duane C, MD   Chief Complaint: Emesis   HPI: Duane [name] is a 39 y.o. male with extensive history of diabetic gastroparesis due to DM1.  Patient presents to the ED with [name]/o N/V.  There is associated 10/10 sharp abdominal pain.  He feels that these symptoms are the same as his typical diabetic gastroparesis symptoms.  Phenergan at home provided little to no relief.  2x 25mg doses of Phenergan, 10mg dose of reglan, and even 2mg haldol was administered in the ED in the ED is also providing little to no relief (patient is actively vomiting while I am in room).  Review of Systems: Systems reviewed.  As above, otherwise negative  Past Medical History  Diagnosis Date  . Hypertension Dx 2012  . Diabetes mellitus without complication (HCC) age 27    insulin requiring from the start  . Gastritis   . Gastroparesis 2012    100% gastric retention on 08/2013 GES  . Obesity   . Diabetic neuropathy ([facility]) Dx 20110  . GERD (gastroesophageal reflux disease) Dx 2012  . Depression    Past Surgical History  Procedure Laterality Date  . Cholecystectomy  2013  . Knee surgery Left   . Flexible sigmoidoscopy N/A 06/11/2013    Procedure: FLEXIBLE SIGMOIDOSCOPY;  Surgeon: Duane E Gessner, MD;  Location: [facility];  Service: Endoscopy;  Laterality: N/A;  . Esophagogastroduodenoscopy N/A 06/11/2013    Procedure: ESOPHAGOGASTRODUODENOSCOPY (EGD);  Surgeon: Duane E Gessner, MD;  Location: [facility];  Service: Endoscopy;  Laterality: N/A;   Social History:  reports that he has never smoked. He has never used smokeless tobacco. He reports that he does not drink alcohol or use illicit drugs.  No Known Allergies  Family History  Problem Relation Age of Onset  . Diabetes Father   . Diabetes Maternal Grandmother   . Diabetes Maternal Grandfather      Prior to  Admission medications   Medication Sig Start Date End Date Taking? Authorizing Provider  Blood Glucose Monitoring Suppl (TRUE METRIX METER) W/DEVICE KIT 1 each by Does not apply route 4 (four) times daily -  before meals and at bedtime. 11/05/14   Duane Funches, MD  gabapentin (NEURONTIN) 300 MG capsule Take 1 capsule (300 mg total) by mouth at bedtime. 09/13/14   Duane Funches, MD  glucose blood (TRUE METRIX BLOOD GLUCOSE TEST) test strip Use as instructed 11/04/14   Duane Funches, MD  insulin aspart (NOVOLOG) 100 UNIT/ML FlexPen Inject 4 Units into the skin 3 (three) times daily with meals. (as per sliding scale) 07/26/14   Duane Amao, MD  insulin glargine (LANTUS) 100 UNIT/ML injection Inject 0.25 mLs (25 Units total) into the skin at bedtime. 07/07/14   Duane Amao, MD  Insulin Pen Needle 31G X 8 MM MISC Inject 1 each into the skin 3 (three) times daily. Check blood sugar TID and QHS 07/26/14   Duane Amao, MD  metoCLOPramide (REGLAN) 10 MG tablet Take 1 tablet (10 mg total) by mouth 4 (four) times daily -  before meals and at bedtime. For 1 week then as needed 07/07/14   Duane Amao, MD  metoprolol tartrate (LOPRESSOR) 50 MG tablet Take 1 tablet (50 mg total) by mouth 2 (two) times daily. 12/30/14   Duane Funches, MD  pantoprazole (PROTONIX) 40 MG tablet Take 1 tablet (40 mg total) by mouth 2 (two) times daily. 07/07/14     Duane Morale, MD  promethazine (PHENERGAN) 25 MG tablet Take 1 tablet (25 mg total) by mouth every 8 (eight) hours as needed for nausea or vomiting. 07/07/14   Duane Morale, MD  sucralfate (CARAFATE) 1 G tablet Take 1 tablet (1 g total) by mouth 3 (three) times daily with meals. 07/07/14   Duane Morale, MD  TRUEPLUS LANCETS 28G MISC 1 each by Does not apply route 4 (four) times daily -  before meals and at bedtime. 11/04/14   Duane Nearing, MD   Physical Exam: Filed Vitals:   01/01/15 0530  BP: 164/100  Pulse: 102  Temp:   Resp:     BP 164/100 mmHg  Pulse 102   Temp(Src) 98.1 F (36.7 [name]) (Oral)  Resp 32  SpO2 93%  General Appearance:    Alert, oriented, actively vomiting, appears stated age  Head:    Normocephalic, atraumatic  Eyes:    PERRL, EOMI, sclera non-icteric        Nose:   Nares without drainage or epistaxis. Mucosa, turbinates normal  Throat:   Moist mucous membranes. Oropharynx without erythema or exudate.  Neck:   Supple. No carotid bruits.  No thyromegaly.  No lymphadenopathy.   Back:     No CVA tenderness, no spinal tenderness  Lungs:     Clear to auscultation bilaterally, without wheezes, rhonchi or rales  Chest wall:    No tenderness to palpitation  Heart:    Regular rate and rhythm without murmurs, gallops, rubs  Abdomen:     Soft, non-tender, nondistended, normal bowel sounds, no organomegaly  Genitalia:    deferred  Rectal:    deferred  Extremities:   No clubbing, cyanosis or edema.  Pulses:   2+ and symmetric all extremities  Skin:   Skin color, texture, turgor normal, no rashes or lesions  Lymph nodes:   Cervical, supraclavicular, and axillary nodes normal  Neurologic:   CNII-XII intact. Normal strength, sensation and reflexes      throughout    Labs on Admission:  Basic Metabolic Panel:  Recent Labs Lab 01/01/15 0119  NA 138  K 3.9  CL 102  CO2 25  GLUCOSE 240*  BUN 12  CREATININE 1.38*  CALCIUM 9.3   Liver Function Tests:  Recent Labs Lab 01/01/15 0119  AST 26  ALT 42  ALKPHOS 113  BILITOT 0.4  PROT 7.5  ALBUMIN 4.3    Recent Labs Lab 01/01/15 0119  LIPASE 27   No results for input(s): AMMONIA in the last 168 hours. CBC:  Recent Labs Lab 01/01/15 0119  WBC 7.7  HGB 14.0  HCT 41.7  MCV 81.1  PLT 267   Cardiac Enzymes: No results for input(s): CKTOTAL, CKMB, CKMBINDEX, TROPONINI in the last 168 hours.  BNP (last 3 results) No results for input(s): PROBNP in the last 8760 hours. CBG:  Recent Labs Lab 01/01/15 0126  GLUCAP 235*    Radiological Exams on Admission: No  results found.  EKG: Independently reviewed.  Assessment/Plan Principal Problem:   Diabetic gastroparesis associated with type 1 diabetes mellitus ([place]) Active Problems:   DM type 1 (diabetes mellitus, type 1) ([place])   HTN (hypertension)   1. Diabetic gastroparesis - 1. Reglan 57m Q6H IV scheduled 2. PRN phenergan 3. PRN morphine for pain 2. DM1 - 1. Reduce home lantus to 20 units QHS while NPO inpatient 2. Adding sensitive scale SSI q4h while NPO 3. HTN - 1. Try and continue home meds 2. Will go ahead and  order PRN hydralazine for SBP > 180 since he will most likely vomit up his home meds and BP running on the high side in ED.    Code Status: Full  Family Communication: No family in room Disposition Plan: Admit to obs  Time spent: 70 min  ,  M. [facility] Pager [phone]  If 7AM-7PM, please contact the day team taking care of the patient [email or web address] Password [email or web address] 01/01/2015, 5:55 AM        

## 2015-01-01 NOTE — Progress Notes (Signed)
Peripherally Inserted Central Catheter/Midline Placement  The IV Nurse has discussed with the patient and/or persons authorized to consent for the patient, the purpose of this procedure and the potential benefits and risks involved with this procedure.  The benefits include less needle sticks, lab draws from the catheter and patient may be discharged home with the catheter.  Risks include, but not limited to, infection, bleeding, blood clot (thrombus formation), and puncture of an artery; nerve damage and irregular heat beat.  Alternatives to this procedure were also discussed.  PICC/Midline Placement Documentation  PICC / Midline Double Lumen 01/01/15 PICC Right Brachial 45 cm 0 cm (Active)  Indication for Insertion or Continuance of Line Limited venous access - need for IV therapy >5 days (PICC only);Poor Vasculature-patient has had multiple peripheral attempts or PIVs lasting less than 24 hours 01/01/2015  1:22 PM  Exposed Catheter (cm) 0 cm 01/01/2015  1:22 PM  Site Assessment Clean;Dry;Intact 01/01/2015  1:22 PM  Lumen #1 Status Flushed;Saline locked;Blood return noted 01/01/2015  1:22 PM  Lumen #2 Status Flushed;Saline locked;Blood return noted 01/01/2015  1:22 PM  Dressing Type Transparent 01/01/2015  1:22 PM  Dressing Status Clean;Dry;Intact;Antimicrobial disc in place 01/01/2015  1:22 PM  Line Care Connections checked and tightened 01/01/2015  1:22 PM  Line Adjustment (NICU/IV Team Only) No 01/01/2015  1:22 PM  Dressing Intervention New dressing 01/01/2015  1:22 PM  Dressing Change Due 01/08/15 01/01/2015  1:22 PM       Rolena Infante 01/01/2015, 1:23 PM

## 2015-01-01 NOTE — ED Provider Notes (Addendum)
CSN: [id number]     Arrival date & time 01/01/15  0044 History  By signing my name below, I, Jolayne Panther, attest that this documentation has been prepared under the direction and in the presence of Etta Quill, DO. Electronically Signed: Jolayne Panther, Scribe. 01/01/2015. 6:08 AM.    Chief Complaint  Patient presents with  . Emesis     The history is provided by the patient. No language interpreter was used.    HPI Comments: Duane [name] is a 39 y.o. male with a Hx of HTN, who presents to the Emergency Department complaining of constant, sharp 10/10 abdominal pain with associated vomiting, diaphoresis onset earlier this evening. He also reports a subjective fever. Pt states that his sugars have been a little high lately. He states that he has taken promethazine at home for his nausea with little to no relief. Pt denies diarrhea. He has no other complaints at this time. Patient with a history of gastroparesis. Feels the symptoms are the same. Denies chest pain or shortness of breath.   Past Medical History  Diagnosis Date  . Hypertension Dx 2012  . Diabetes mellitus without complication [facility]) age 40    insulin requiring from the start  . Gastritis   . Gastroparesis 2012    100% gastric retention on 08/2013 GES  . Obesity   . Diabetic neuropathy ([place]) Dx 20110  . GERD (gastroesophageal reflux disease) Dx 2012  . Depression    Past Surgical History  Procedure Laterality Date  . Cholecystectomy  2013  . Knee surgery Left   . Flexible sigmoidoscopy N/A 06/11/2013    Procedure: FLEXIBLE SIGMOIDOSCOPY;  Surgeon: [name], MD;  Location: [facility] ENDOSCOPY;  Service: Endoscopy;  Laterality: N/A;  . Esophagogastroduodenoscopy N/A 06/11/2013    Procedure: ESOPHAGOGASTRODUODENOSCOPY (EGD);  Surgeon: [name], MD;  Location: [facility];  Service: Endoscopy;  Laterality: N/A;   Family History  Problem Relation Age of Onset  . Diabetes Father   . Diabetes  Maternal Grandmother   . Diabetes Maternal Grandfather    Social History  Substance Use Topics  . Smoking status: Never Smoker   . Smokeless tobacco: Never Used  . Alcohol Use: No    Review of Systems  Constitutional: Positive for fever and diaphoresis.  Respiratory: Negative for shortness of breath.   Cardiovascular: Negative for chest pain.  Gastrointestinal: Positive for vomiting and abdominal pain. Negative for diarrhea.  Genitourinary: Negative for dysuria.  All other systems reviewed and are negative.     Allergies  Review of patient's allergies indicates no known allergies.  Home Medications   Prior to Admission medications   Medication Sig Start Date End Date Taking? Authorizing Provider  Blood Glucose Monitoring Suppl (TRUE METRIX METER) W/DEVICE KIT 1 each by Does not apply route 4 (four) times daily -  before meals and at bedtime. 11/05/14   Josalyn Funches, MD  gabapentin (NEURONTIN) 300 MG capsule Take 1 capsule (300 mg total) by mouth at bedtime. 09/13/14   Josalyn Funches, MD  glucose blood (TRUE METRIX BLOOD GLUCOSE TEST) test strip Use as instructed 11/04/14   Boykin Nearing, MD  insulin aspart (NOVOLOG) 100 UNIT/ML FlexPen Inject 4 Units into the skin 3 (three) times daily with meals. (as per sliding scale) 07/26/14   Arnoldo Morale, MD  insulin glargine (LANTUS) 100 UNIT/ML injection Inject 0.25 mLs (25 Units total) into the skin at bedtime. 07/07/14   Arnoldo Morale, MD  Insulin Pen Needle 31G X  8 MM MISC Inject 1 each into the skin 3 (three) times daily. Check blood sugar TID and QHS 07/26/14   Arnoldo Morale, MD  metoCLOPramide (REGLAN) 10 MG tablet Take 1 tablet (10 mg total) by mouth 4 (four) times daily -  before meals and at bedtime. For 1 week then as needed 07/07/14   Arnoldo Morale, MD  metoprolol tartrate (LOPRESSOR) 50 MG tablet Take 1 tablet (50 mg total) by mouth 2 (two) times daily. 12/30/14   Josalyn Funches, MD  pantoprazole (PROTONIX) 40 MG tablet Take 1  tablet (40 mg total) by mouth 2 (two) times daily. 07/07/14   Arnoldo Morale, MD  promethazine (PHENERGAN) 25 MG tablet Take 1 tablet (25 mg total) by mouth every 8 (eight) hours as needed for nausea or vomiting. 07/07/14   Arnoldo Morale, MD  sucralfate (CARAFATE) 1 G tablet Take 1 tablet (1 g total) by mouth 3 (three) times daily with meals. 07/07/14   Arnoldo Morale, MD  TRUEPLUS LANCETS 28G MISC 1 each by Does not apply route 4 (four) times daily -  before meals and at bedtime. 11/04/14   Josalyn Funches, MD   BP 164/100 mmHg  Pulse 102  Temp(Src) 98.1 F (36.7 C) (Oral)  Resp 32  SpO2 93% Physical Exam  Constitutional: He is oriented to person, place, and time.  Overweight, ill-appearing but nontoxic, diaphoretic  HENT:  Head: Normocephalic and atraumatic.  Cardiovascular: Regular rhythm and normal heart sounds.   No murmur heard. Tachycardia  Pulmonary/Chest: Effort normal and breath sounds normal. No respiratory distress. He has no wheezes.  Abdominal: Soft. Bowel sounds are normal. There is no tenderness. There is no rebound and no guarding.  Retching  Musculoskeletal: He exhibits no edema.  Neurological: He is alert and oriented to person, place, and time.  Skin: Skin is warm and dry.  Psychiatric: He has a normal mood and affect.  Nursing note and vitals reviewed.   ED Course  Procedures   Angiocath insertion Performed by: Merryl Hacker  Consent: Verbal consent obtained. Risks and benefits: risks, benefits and alternatives were discussed Time out: Immediately prior to procedure a "time out" was called to verify the correct patient, procedure, equipment, support staff and site/side marked as required.  Preparation: Patient was prepped and draped in the usual sterile fashion.  Vein Location: Right EJ  Gauge: 18  Normal blood return and flush without difficulty Patient tolerance: Patient tolerated the procedure well with no immediate complications.    DIAGNOSTIC  STUDIES:    Oxygen Saturation is 95% on RA, adequate by my interpretation.   COORDINATION OF CARE:  6:08 AM  Discussed treatment plan with pt at bedside and pt agreed to plan.     Labs Review Labs Reviewed  COMPREHENSIVE METABOLIC PANEL - Abnormal; Notable for the following:    Glucose, Bld 240 (*)    Creatinine, Ser 1.38 (*)    All other components within normal limits  CBG MONITORING, ED - Abnormal; Notable for the following:    Glucose-Capillary 235 (*)    All other components within normal limits  LIPASE, BLOOD  CBC  URINALYSIS, ROUTINE W REFLEX MICROSCOPIC (NOT AT [facility])    Imaging Review No results found. I have personally reviewed and evaluated these images and lab results as part of my medical decision-making.   EKG Interpretation   Date/Time:  Saturday January 01 2015 01:35:19 EST Ventricular Rate:  92 PR Interval:  219 QRS Duration: 87 QT Interval:  353 QTC  Calculation: 437 R Axis:   57 Text Interpretation:  Sinus rhythm Prolonged PR interval Probable left  atrial enlargement Borderline T abnormalities, inferior leads Confirmed by  Anyla Israelson  MD, Jamaris Theard ([id number]) on 01/01/2015 1:53:09 AM      MDM   Final diagnoses:  Gastroparesis    Patient presents with vomiting and pain consistent with his gastroparesis. Diaphoretic. Otherwise vital signs are reassuring. Patient was given Reglan, Benadryl, fluids but reports persistent symptoms. Patient was subsequently given Phenergan and Haldol. I placed an EJ.  Lab work is largely reassuring. EKG is nonischemic. Patient continues to be symptomatic. Discussed with the hospitalist for admission for symptom control. Patient to be admitted to Dr. Alcario Drought.  I personally performed the services described in this documentation, which was scribed in my presence. The recorded information has been reviewed and is accurate.    Merryl Hacker, MD 01/01/15 0982  Merryl Hacker, MD 01/01/15 [phone]

## 2015-01-01 NOTE — ED Notes (Signed)
Pt. reports persistent nausea/emesis onset this evening unrelieved by prescription Phenergan , denies diarrhea , no fever or chills.

## 2015-01-02 DIAGNOSIS — N179 Acute kidney failure, unspecified: Secondary | ICD-10-CM

## 2015-01-02 LAB — BASIC METABOLIC PANEL
ANION GAP: 8 (ref 5–15)
BUN: 12 mg/dL (ref 6–20)
CHLORIDE: 105 mmol/L (ref 101–111)
CO2: 27 mmol/L (ref 22–32)
Calcium: 8.2 mg/dL — ABNORMAL LOW (ref 8.9–10.3)
Creatinine, Ser: 1.29 mg/dL — ABNORMAL HIGH (ref 0.61–1.24)
GFR calc non Af Amer: >60 mL/min (ref >60)
Glucose, Bld: 134 mg/dL — ABNORMAL HIGH (ref 65–99)
POTASSIUM: 3.5 mmol/L (ref 3.5–5.1)
Sodium: 140 mmol/L (ref 135–145)

## 2015-01-02 LAB — RAPID URINE DRUG SCREEN, HOSP PERFORMED
Amphetamines: NOT DETECTED
Barbiturates: NOT DETECTED
Benzodiazepines: NOT DETECTED
COCAINE: NOT DETECTED
OPIATES: POSITIVE — AB
TETRAHYDROCANNABINOL: NOT DETECTED

## 2015-01-02 LAB — CBC
HEMATOCRIT: 37.4 % — AB (ref 39.0–52.0)
HEMOGLOBIN: 12.2 g/dL — AB (ref 13.0–17.0)
MCH: 26.6 pg (ref 26.0–34.0)
MCHC: 32.6 g/dL (ref 30.0–36.0)
MCV: 81.7 fL (ref 78.0–100.0)
Platelets: 253 10*3/uL (ref 150–400)
RBC: 4.58 MIL/uL (ref 4.22–5.81)
RDW: 13 % (ref 11.5–15.5)
WBC: 9.7 10*3/uL (ref 4.0–10.5)

## 2015-01-02 LAB — GLUCOSE, CAPILLARY
GLUCOSE-CAPILLARY: 126 mg/dL — AB (ref 65–99)
GLUCOSE-CAPILLARY: 143 mg/dL — AB (ref 65–99)
Glucose-Capillary: 114 mg/dL — ABNORMAL HIGH (ref 65–99)
Glucose-Capillary: 131 mg/dL — ABNORMAL HIGH (ref 65–99)
Glucose-Capillary: 152 mg/dL — ABNORMAL HIGH (ref 65–99)

## 2015-01-02 MED ORDER — METOCLOPRAMIDE HCL 5 MG/ML IJ SOLN
10.0000 mg | Freq: Three times a day (TID) | INTRAMUSCULAR | Status: DC
  Administered 2015-01-02 – 2015-01-05 (×13): 10 mg via INTRAVENOUS
  Filled 2015-01-02 (×13): qty 2

## 2015-01-02 NOTE — Progress Notes (Signed)
[facility] ROUNDING NOTE   Subjective:  No further vomiting, has nausea. No bowel movement. No abdominal pain  Objective: Vital signs in last 24 hours: Temp:  [98.7 F (37.1 C)-98.9 F (37.2 C)] 98.7 F (37.1 C) (11/20 [id number]) Pulse Rate:  [95-106] 100 (11/20 0613) Resp:  [18] 18 (11/20 0613) BP: (123-181)/(64-104) 130/82 mmHg (11/20 0613) SpO2:  [97 %-99 %] 99 % (11/20 [id number])   General: NAD Lungs: b/l clear Heart: s1s2, no mumurs Abdomen: soft, NT, no distension, BS+ Ext: no edema     Lab Results:  Recent Labs  01/01/15 0119 01/02/15 0741  WBC 7.7 9.7  HGB 14.0 12.2*  PLT 267 253  MCV 81.1 81.7   BMET  Recent Labs  01/01/15 0119 01/02/15 0741  NA 138 140  K 3.9 3.5  CL 102 105  CO2 25 27  GLUCOSE 240* 134*  BUN 12 12  CREATININE 1.38* 1.29*  CALCIUM 9.3 8.2*   LFT  Recent Labs  01/01/15 0119  PROT 7.5  ALBUMIN 4.3  AST 26  ALT 42  ALKPHOS 113  BILITOT 0.4      Assessment and Plan  14 yr M with h/o poorly controlled diabetes and ?gastroparesis admitted with intractable nausea and vomiting. Patient did report having persistently high blood sugar levels for past few days and dietary indiscretion.  Improving on IV reglan and erythromycin Continue IV erythromycin for today and can consider discontinuing it tomorrow Continue IV hydration and supportive care Advised patient to maintain steady blood sugar level and avoid hyper or hypoglycemia Advance diet as tolerated Avoid high fiber and high fat diet Chronic diarrhea alternating with constipation (IBS-D vs post cholecystectomy vs functional) Can consider starting cholestyramine after resolution of acute symptoms EGD and flex sig in 05/2014 unremarkable, repeat endoscopic evaluation likely to be very low yield and not recommended We will sign off, available for any questions    K. Denzil Magnuson , MD [phone] Mon-Fri 8a-5p [phone] after 5p, weekends, holidays [facility]

## 2015-01-02 NOTE — Progress Notes (Addendum)
PATIENT DETAILS Name: Duane [name] Age: 39 y.o. Sex: male Date of Birth: 01-12-1976 Admit Date: 01/01/2015 Admitting Physician Etta Quill, DO [id number], Lennox Laity, MD  Subjective: Improved-no diarrhea-no vomiting-only minimally nauseous at times.   Assessment/Plan: Principal Problem: Diabetic gastroparesis  Flare:Improved with IV Erythromycin and IV Reglan-belly is benign on exam, start clear liquids and follow. Appreciate GI input.Xray Abd 11/18 neg for acute abnormalities.  Active Problems: [id number] pre-renal Azotemia-secondary to above-resolving with IVF  DM type 1 (diabetes mellitus, type 1):CBGs controlled-Continue Lantus 20 units and SSI-follow and adjust accordingly  HTN (hypertension):continue witht IV metoprolol 5 mg every 8 hours, continue IV hydralazine as needed. If tolerates diet well-will start oral antihypertensives.   GERD: Continue PPI  Disposition: Remain inpatient-requires 1-2 additional days prior to discharge  Antimicrobial agents  See below  Anti-infectives    Start     Dose/Rate Route Frequency Ordered Stop   01/01/15 1400  erythromycin 500 mg in sodium chloride 0.9 % 100 mL IVPB     500 mg 100 mL/hr over 60 Minutes Intravenous 3 times per day 01/01/15 1207        DVT Prophylaxis: Prophylactic Heparin   Code Status: Full code   Family Communication None at bedside  Procedures: None  CONSULTS:  GI  Time spent 25 minutes-Greater than 50% of this time was spent in counseling, explanation of diagnosis, planning of further management, and coordination of care.  MEDICATIONS: Scheduled Meds: . erythromycin  500 mg Intravenous 3 times per day  . gabapentin  300 mg Oral QHS  . heparin  5,000 Units Subcutaneous 3 times per day  . insulin aspart  0-9 Units Subcutaneous 6 times per day  . insulin glargine  20 Units Subcutaneous QHS  . metoCLOPramide (REGLAN) injection  10 mg Intravenous 4 times per day  .  metoprolol  5 mg Intravenous 3 times per day  . pantoprazole (PROTONIX) IV  40 mg Intravenous Q12H  . sodium chloride  10-40 mL Intracatheter Q12H  . sucralfate  1 g Oral TID WC   Continuous Infusions: . sodium chloride 125 mL/hr at 01/01/15 2329   PRN Meds:.hydrALAZINE, morphine injection, promethazine, promethazine, sodium chloride    PHYSICAL EXAM: Vital signs in last 24 hours: Filed Vitals:   01/01/15 1445 01/01/15 2133 01/02/15 0129 01/02/15 0613  BP: 123/69 139/85 128/64 130/82  Pulse: 95 106 102 100  Temp: 98.9 F (37.2 C) 98.9 F (37.2 C) 98.8 F (37.1 C) 98.7 F (37.1 C)  TempSrc: Oral Oral Oral Oral  Resp: 18 18 18 18   SpO2: 97% 98% 99% 99%    Weight change:  There were no vitals filed for this visit. There is no weight on file to calculate BMI.   Gen Exam: Awake and alert with clear speech.   Neck: Supple, No JVD.  Chest: B/L Clear.   CVS: S1 S2 Regular, no murmurs.  Abdomen: soft, BS +, non tender, non distended.  Extremities: no edema, lower extremities warm to touch Neurologic: Non Focal.   Skin: No Rash.   Wounds: N/A.    Intake/Output from previous day: No intake or output data in the 24 hours ending 01/02/15 0841   LAB RESULTS: CBC  Recent Labs Lab 01/01/15 0119 01/02/15 0741  WBC 7.7 9.7  HGB 14.0 12.2*  HCT 41.7 37.4*  PLT 267 253  MCV 81.1 81.7  MCH 27.2 26.6  MCHC 33.6 32.6  RDW 12.6 13.0    Chemistries   Recent Labs Lab 01/18/2015 0119 01/02/15 0741  NA 138 140  K 3.9 3.5  CL 102 105  CO2 25 27  GLUCOSE 240* 134*  BUN 12 12  CREATININE 1.38* 1.29*  CALCIUM 9.3 8.2*    CBG:  Recent Labs Lab 01/18/15 1642 01-18-2015 1949 01/18/2015 2346 01/02/15 0618 01/02/15 0741  GLUCAP 182* 148* 137* 114* 126*    GFR Estimated Creatinine Clearance: 117.4 mL/min (by C-G formula based on Cr of 1.29).  Coagulation profile No results for input(s): INR, PROTIME in the last 168 hours.  Cardiac Enzymes No results for input(s):  CKMB, TROPONINI, MYOGLOBIN in the last 168 hours.  Invalid input(s): CK  Invalid input(s): POCBNP No results for input(s): DDIMER in the last 72 hours. No results for input(s): HGBA1C in the last 72 hours. No results for input(s): CHOL, HDL, LDLCALC, TRIG, CHOLHDL, LDLDIRECT in the last 72 hours. No results for input(s): TSH, T4TOTAL, T3FREE, THYROIDAB in the last 72 hours.  Invalid input(s): FREET3 No results for input(s): VITAMINB12, FOLATE, FERRITIN, TIBC, IRON, RETICCTPCT in the last 72 hours.  Recent Labs  01-18-15 0119  LIPASE 27    Urine Studies No results for input(s): UHGB, CRYS in the last 72 hours.  Invalid input(s): UACOL, UAPR, USPG, UPH, UTP, UGL, UKET, UBIL, UNIT, UROB, ULEU, UEPI, UWBC, URBC, UBAC, CAST, UCOM, BILUA  MICROBIOLOGY: No results found for this or any previous visit (from the past 240 hour(s)).  RADIOLOGY STUDIES/RESULTS: Dg Abd 2 Views  Jan 18, 2015  CLINICAL DATA:  Persistent abdominal pain and vomiting since yesterday. History of hypertension and diabetes. EXAM: ABDOMEN - 2 VIEW COMPARISON:  06/29/2014 FINDINGS: No bowel dilation is seen to suggest obstruction. There is no adynamic ileus or free air. Mild colonic stool burden is noted. Status post cholecystectomy, stable. No evidence of renal or ureteral stones. Bony structures are unremarkable. IMPRESSION: 1. No acute findings. No evidence of bowel obstruction, generalized adynamic ileus or free air. Electronically Signed   By: Lajean Manes M.D.   On: 2015-01-18 10:21    Oren Binet, MD  [facility] Pager:[phone]  If 7PM-7AM, please contact night-coverage [email or web address] Password [email or web address] 01/02/2015, 8:41 AM   LOS: 1 day

## 2015-01-03 DIAGNOSIS — R1110 Vomiting, unspecified: Secondary | ICD-10-CM

## 2015-01-03 DIAGNOSIS — E1165 Type 2 diabetes mellitus with hyperglycemia: Secondary | ICD-10-CM

## 2015-01-03 LAB — GLUCOSE, CAPILLARY
GLUCOSE-CAPILLARY: 142 mg/dL — AB (ref 65–99)
GLUCOSE-CAPILLARY: 154 mg/dL — AB (ref 65–99)
GLUCOSE-CAPILLARY: 109 mg/dL — AB (ref 65–99)
GLUCOSE-CAPILLARY: 87 mg/dL (ref 65–99)
Glucose-Capillary: 127 mg/dL — ABNORMAL HIGH (ref 65–99)
Glucose-Capillary: 157 mg/dL — ABNORMAL HIGH (ref 65–99)
Glucose-Capillary: 99 mg/dL (ref 65–99)

## 2015-01-03 LAB — BASIC METABOLIC PANEL
ANION GAP: 8 (ref 5–15)
BUN: 12 mg/dL (ref 6–20)
CALCIUM: 7.9 mg/dL — AB (ref 8.9–10.3)
CO2: 26 mmol/L (ref 22–32)
Chloride: 105 mmol/L (ref 101–111)
Creatinine, Ser: 1.21 mg/dL (ref 0.61–1.24)
GFR calc Af Amer: >60 mL/min (ref >60)
GFR calc non Af Amer: >60 mL/min (ref >60)
GLUCOSE: 160 mg/dL — AB (ref 65–99)
POTASSIUM: 3.6 mmol/L (ref 3.5–5.1)
Sodium: 139 mmol/L (ref 135–145)

## 2015-01-03 LAB — HEMOGLOBIN A1C
Hgb A1c MFr Bld: 10.1 % — ABNORMAL HIGH (ref 4.8–5.6)
Mean Plasma Glucose: 243 mg/dL

## 2015-01-03 MED ORDER — ONDANSETRON HCL 4 MG/2ML IJ SOLN
4.0000 mg | Freq: Four times a day (QID) | INTRAMUSCULAR | Status: DC
  Administered 2015-01-03 – 2015-01-05 (×8): 4 mg via INTRAVENOUS
  Filled 2015-01-03 (×8): qty 2

## 2015-01-03 MED ORDER — MORPHINE SULFATE (PF) 2 MG/ML IV SOLN
0.5000 mg | INTRAVENOUS | Status: DC | PRN
  Administered 2015-01-03 – 2015-01-04 (×2): 0.5 mg via INTRAVENOUS
  Filled 2015-01-03 (×2): qty 1

## 2015-01-03 MED ORDER — LORAZEPAM 2 MG/ML IJ SOLN
1.0000 mg | Freq: Four times a day (QID) | INTRAMUSCULAR | Status: DC | PRN
  Administered 2015-01-03 (×2): 1 mg via INTRAVENOUS
  Filled 2015-01-03 (×2): qty 1

## 2015-01-03 NOTE — Progress Notes (Signed)
Pt. continues with nausea and vomiting most of the night. Phenergan given with minimal relief per pt.

## 2015-01-03 NOTE — Progress Notes (Signed)
Daily Rounding Note  01/03/2015, 12:04 PM  LOS: 2 days   SUBJECTIVE:       Persistent vomiting.  Type 1 diabetic with established diabetic gastroparesis. 100% gastric retention on 08/2013 GES.  [place] on 05/2013 EGD.  Home regimen of BID PPI, qid [place], prn Phenergen.  As inpt IV erythromycin added.  Got single dose of Morphine 11/19, but this is discontinued.  Bouts with intermittent diarrhea interspersed with  As of 11/20, n/v had ceased.   Scheduled Meds: . erythromycin  500 mg Intravenous 3 times per day  . gabapentin  300 mg Oral QHS  . heparin  5,000 Units Subcutaneous 3 times per day  . insulin aspart  0-9 Units Subcutaneous 6 times per day  . insulin glargine  20 Units Subcutaneous QHS  . metoCLOPramide (REGLAN) injection  10 mg Intravenous TID AC & HS  . metoprolol  5 mg Intravenous 3 times per day  . pantoprazole (PROTONIX) IV  40 mg Intravenous Q12H  . sodium chloride  10-40 mL Intracatheter Q12H  . sucralfate  1 g Oral TID WC   Continuous Infusions: . sodium chloride 125 mL/hr at 01/02/15 1730   PRN Meds:.hydrALAZINE, LORazepam, morphine injection, promethazine, sodium chloride    OBJECTIVE:         Vital signs in last 24 hours:    Temp:  [98.5 F (36.9 C)-98.6 F (37 C)] 98.6 F (37 C) (11/21 0543) Pulse Rate:  [98-105] 98 (11/20 2216) Resp:  [16-19] 19 (11/21 0543) BP: (141-148)/(74-88) 141/74 mmHg (11/20 2216) SpO2:  [96 %-97 %] 97 % (11/21 0543) Last BM Date: 12/30/14 There were no vitals filed for this visit. General: looks miserable.  Obese.  Emesis basin laying next to pt reclining on bed   Heart: RRR Chest: clear bil.  No cough or labored breathing Abdomen: obese, soft, NT.  BS hypoactive  Extremities: edema in arms Neuro/Psych:  Oriented x 3, alert.  Affect depressed.   Intake/Output from previous day: 11/20 0701 - 11/21 0700 In: 1853 [P.O.:80; I.V.:1773] Out: 1470 [Urine:900;  Emesis/NG output:570]  Intake/Output this shift:    Lab Results:  Recent Labs  01/01/15 0119 01/02/15 0741  WBC 7.7 9.7  HGB 14.0 12.2*  HCT 41.7 37.4*  PLT 267 253   BMET  Recent Labs  01/01/15 0119 01/02/15 0741 01/03/15 0641  NA 138 140 139  K 3.9 3.5 3.6  CL 102 105 105  CO2 25 27 26   GLUCOSE 240* 134* 160*  BUN 12 12 12   CREATININE 1.38* 1.29* 1.21  CALCIUM 9.3 8.2* 7.9*   LFT  Recent Labs  01/01/15 0119  PROT 7.5  ALBUMIN 4.3  AST 26  ALT 42  ALKPHOS 113  BILITOT 0.4   PT/INR No results for input(s): LABPROT, INR in the last 72 hours. Hepatitis Panel No results for input(s): HEPBSAG, HCVAB, HEPAIGM, HEPBIGM in the last 72 hours.  Studies/Results: No results found.  ASSESMENT:   *  Flare of diabetic gastroparesis. One of many.     *  Type 1 DM, A1c 10.1.    *  Intermittent bouts of loose stools predating 2013 cholecystectomy , no BM for 3 days. Although some notes mention hx C diff, on questioning pt: a few years ago a PMD made presumptive dx of c diff, but pt says he did not have stool studies or endoscopic eval.  Thus there is no confirmed hx c diff.  PLAN   *  Added scheduled IV Zofran.   *  Supportive care. *   Long term needs to optimize glucose control. *  Pt needs to get up and walk and into chair.  *  Note orders to send stool for C diff.  Pt has had no BMs for ~3 days.      Azucena Freed  01/03/2015, 12:04 PM Pager: [phone]  GI ATTENDING  Reconsult as regarding nausea with vomiting. Original consultation note and prior records reviewed. Agree with progress note as outlined above. Patient personally seen and examined. Patient with poorly controlled diabetes mellitus. Has had multiple hospitalizations for intractable nausea with vomiting secondary to diabetic gastroparesis. Prior upper endoscopy for the same unrevealing. He is currently on twice a day IV PPI and erythromycin. Phenergan for nausea and vomiting. Recommend  complete avoidance of narcotics, ongoing generous hydration, and we will add running Zofran for nausea. I have also encouraged patient to increase his activity and ambulate as this will help. Diet as tolerated. Long-term, good control blood sugars is paramount. I discussed this with him. Really nothing additional to add from GI standpoint. We're available as needed for questions. Thank you  Docia Chuck. Geri Seminole., M.D. [facility] Division of Gastroenterology

## 2015-01-03 NOTE — Progress Notes (Signed)
[facility] PROGRESS NOTE  Duane [name] [id number] DOB: 19-Dec-1975 DOA: 01/01/2015 PCP: Minerva Ends, MD  Assessment/Plan: Principal Problem: Diabetic gastroparesis flare: Continues to have persistent vomiting, continue with scheduled Reglan. Continue IV erythromycin and IV fluids. Minimize use of IV narcotics-decrease Morphine to 0.5 mg. Abdomen is soft without any acute findings-x-ray abdomen negative for acute abnormalities as well. Given persistent vomiting-GI reconsulted. Back to NPO.  Active Problems: DM type 1 (diabetes mellitus, type 1):CBGs controlled-Continue Lantus 20 units and SSI-follow and adjust accordingly. Clear liquids as tolerated.  HTN (hypertension): Continue IV metoprolol 5 mg every 8 hours, continue IV hydralazine as needed. We will follow and adjust accordingly. Patient still with nausea/vomiting, start oral antihypertensives when tolerates diet.  GERD: Continue PPI  Disposition: Remain inpatient  Antimicrobial agents  Start   Dose/Rate Route Frequency Ordered Stop   01/01/15 1400  erythromycin 500 mg in sodium chloride 0.9 % 100 mL IVPB    500 mg 100 mL/hr over 60 Minutes Intravenous 3 times per day 01/01/15 1207          Code Status: Full code Family Communication: None at bedside Disposition Plan: Inpatient   Consultants:  GI  Procedures: none  DVT Prophylaxis: Prophylactic Heparin   HPI/Subjective: Patient continues to have nausea and vomiting.  Objective: Filed Vitals:   01/02/15 2216 01/03/15 0543  BP: 141/74   Pulse: 98   Temp: 98.5 F (36.9 C) 98.6 F (37 C)  Resp: 17 19    Intake/Output Summary (Last 24 hours) at 01/03/15 1337 Last data filed at 01/03/15 [id number]  Gross per 24 hour  Intake   1803 ml  Output    570 ml  Net   1233 ml   There were no vitals filed for this visit.  Exam: Gen Exam: Awake and alert with clear speech.  Neck: Supple, No JVD.  Chest: B/L Clear.  CVS:  S1 S2 Regular, no murmurs.  Abdomen: soft, BS +, non tender, non distended.  Extremities: no edema, lower extremities warm to touch Neurologic: Non Focal.  Skin: No Rash.  Wounds: N/A.   Data Reviewed: Basic Metabolic Panel:  Recent Labs Lab 01/01/15 0119 01/02/15 0741 01/03/15 0641  NA 138 140 139  K 3.9 3.5 3.6  CL 102 105 105  CO2 25 27 26   GLUCOSE 240* 134* 160*  BUN 12 12 12   CREATININE 1.38* 1.29* 1.21  CALCIUM 9.3 8.2* 7.9*   Liver Function Tests:  Recent Labs Lab 01/01/15 0119  AST 26  ALT 42  ALKPHOS 113  BILITOT 0.4  PROT 7.5  ALBUMIN 4.3    Recent Labs Lab 01/01/15 0119  LIPASE 27   No results for input(s): AMMONIA in the last 168 hours. CBC:  Recent Labs Lab 01/01/15 0119 01/02/15 0741  WBC 7.7 9.7  HGB 14.0 12.2*  HCT 41.7 37.4*  MCV 81.1 81.7  PLT 267 253   Cardiac Enzymes: No results for input(s): CKTOTAL, CKMB, CKMBINDEX, TROPONINI in the last 168 hours. BNP (last 3 results) No results for input(s): BNP in the last 8760 hours.  ProBNP (last 3 results) No results for input(s): PROBNP in the last 8760 hours.  CBG:  Recent Labs Lab 01/02/15 2014 01/03/15 0008 01/03/15 0428 01/03/15 0802 01/03/15 1254  GLUCAP 152* 157* 142* 154* 127*    No results found for this or any previous visit (from the past 240 hour(s)).   Studies: No results found.  Scheduled Meds: . erythromycin  500 mg Intravenous 3 times  per day  . gabapentin  300 mg Oral QHS  . heparin  5,000 Units Subcutaneous 3 times per day  . insulin aspart  0-9 Units Subcutaneous 6 times per day  . insulin glargine  20 Units Subcutaneous QHS  . metoCLOPramide (REGLAN) injection  10 mg Intravenous TID AC & HS  . metoprolol  5 mg Intravenous 3 times per day  . ondansetron (ZOFRAN) IV  4 mg Intravenous 4 times per day  . pantoprazole (PROTONIX) IV  40 mg Intravenous Q12H  . sodium chloride  10-40 mL Intracatheter Q12H  . sucralfate  1 g Oral TID WC    Continuous Infusions: . sodium chloride 125 mL/hr at 01/03/15 1234    Principal Problem:   Diabetic gastroparesis associated with type 1 diabetes mellitus ([place]) Active Problems:   DM type 1 (diabetes mellitus, type 1) ([place])   HTN (hypertension)  Time spent: 25 minutes  [facility]  If 7PM-7AM, please contact night-coverage at [email or web address], password [facility] 01/03/2015, 1:37 PM  LOS: 2 days

## 2015-01-04 LAB — MAGNESIUM: Magnesium: 1.8 mg/dL (ref 1.7–2.4)

## 2015-01-04 LAB — GLUCOSE, CAPILLARY
GLUCOSE-CAPILLARY: 109 mg/dL — AB (ref 65–99)
GLUCOSE-CAPILLARY: 117 mg/dL — AB (ref 65–99)
Glucose-Capillary: 123 mg/dL — ABNORMAL HIGH (ref 65–99)
Glucose-Capillary: 110 mg/dL — ABNORMAL HIGH (ref 65–99)
Glucose-Capillary: 127 mg/dL — ABNORMAL HIGH (ref 65–99)
Glucose-Capillary: 169 mg/dL — ABNORMAL HIGH (ref 65–99)

## 2015-01-04 LAB — BASIC METABOLIC PANEL
Anion gap: 6 (ref 5–15)
BUN: 11 mg/dL (ref 6–20)
CHLORIDE: 105 mmol/L (ref 101–111)
CO2: 27 mmol/L (ref 22–32)
CREATININE: 1.25 mg/dL — AB (ref 0.61–1.24)
Calcium: 8 mg/dL — ABNORMAL LOW (ref 8.9–10.3)
GFR calc non Af Amer: >60 mL/min (ref >60)
GLUCOSE: 149 mg/dL — AB (ref 65–99)
Potassium: 3.7 mmol/L (ref 3.5–5.1)
Sodium: 138 mmol/L (ref 135–145)

## 2015-01-04 MED ORDER — LORAZEPAM 2 MG/ML IJ SOLN
1.0000 mg | Freq: Once | INTRAMUSCULAR | Status: AC
  Administered 2015-01-04: 1 mg via INTRAVENOUS
  Filled 2015-01-04: qty 1

## 2015-01-04 MED ORDER — TRAMADOL HCL 50 MG PO TABS
50.0000 mg | ORAL_TABLET | Freq: Four times a day (QID) | ORAL | Status: DC | PRN
  Administered 2015-01-05: 50 mg via ORAL
  Filled 2015-01-04: qty 1

## 2015-01-04 MED ORDER — TRAMADOL HCL 50 MG PO TABS
100.0000 mg | ORAL_TABLET | Freq: Once | ORAL | Status: AC
  Administered 2015-01-04: 100 mg via ORAL
  Filled 2015-01-04: qty 2

## 2015-01-04 MED ORDER — ONDANSETRON HCL 4 MG/2ML IJ SOLN
4.0000 mg | Freq: Once | INTRAMUSCULAR | Status: AC
  Administered 2015-01-04: 4 mg via INTRAVENOUS
  Filled 2015-01-04: qty 2

## 2015-01-04 NOTE — Progress Notes (Signed)
PATIENT DETAILS Name: Duane [name] Age: 39 y.o. Sex: male Date of Birth: 1975-04-26 Admit Date: 01/01/2015 Admitting Physician Etta Quill, DO [id number], Lennox Laity, MD  Subjective: No vomiting since last night  Assessment/Plan: Principal Problem: Diabetic gastroparesis  Flare: Some improvement with IV Erythromycin, scheduled IV Reglan and Zofran-belly is benign on exam, restart clear liquids and follow. Appreciate GI input.Xray Abd 11/18 neg for acute abnormalities. Will discontinue morphine  Active Problems: [id number] pre-renal Azotemia-secondary to above-resolving with IVF  DM type 1 (diabetes mellitus, type 1):CBGs controlled-Continue Lantus 20 units and SSI-follow and adjust accordingly  HTN (hypertension):Controlled-continue with IV metoprolol 5 mg every 8 hours, continue IV hydralazine as needed. If tolerates diet well-will start oral antihypertensives.   GERD: Continue PPI  Disposition: Remain inpatient-home when vomiting resolves-diet able to be advanced  Antimicrobial agents  See below  Anti-infectives    Start     Dose/Rate Route Frequency Ordered Stop   01/01/15 1400  erythromycin 500 mg in sodium chloride 0.9 % 100 mL IVPB     500 mg 100 mL/hr over 60 Minutes Intravenous 3 times per day 01/01/15 1207        DVT Prophylaxis: Prophylactic Heparin   Code Status: Full code   Family Communication None at bedside  Procedures: None  CONSULTS:  GI  Time spent 25 minutes-Greater than 50% of this time was spent in counseling, explanation of diagnosis, planning of further management, and coordination of care.  MEDICATIONS: Scheduled Meds: . erythromycin  500 mg Intravenous 3 times per day  . gabapentin  300 mg Oral QHS  . heparin  5,000 Units Subcutaneous 3 times per day  . insulin aspart  0-9 Units Subcutaneous 6 times per day  . insulin glargine  20 Units Subcutaneous QHS  . metoCLOPramide (REGLAN) injection  10 mg  Intravenous TID AC & HS  . metoprolol  5 mg Intravenous 3 times per day  . ondansetron (ZOFRAN) IV  4 mg Intravenous 4 times per day  . pantoprazole (PROTONIX) IV  40 mg Intravenous Q12H  . sodium chloride  10-40 mL Intracatheter Q12H  . sucralfate  1 g Oral TID WC   Continuous Infusions: . sodium chloride 125 mL/hr at 01/03/15 2132   PRN Meds:.hydrALAZINE, LORazepam, morphine injection, promethazine, sodium chloride    PHYSICAL EXAM: Vital signs in last 24 hours: Filed Vitals:   01/03/15 1503 01/03/15 2039 01/04/15 0408 01/04/15 1401  BP: 147/90 177/108 142/87 149/91  Pulse: 87  89 78  Temp: 98.6 F (37 C) 97.7 F (36.5 C) 99 F (37.2 C) 98.2 F (36.8 C)  TempSrc: Oral Oral Oral Oral  Resp: 17 18 17 18   SpO2: 100% 94% 92% 95%    Weight change:  There were no vitals filed for this visit. There is no weight on file to calculate BMI.   Gen Exam: Awake and alert with clear speech.   Neck: Supple, No JVD.  Chest: B/L Clear.   CVS: S1 S2 Regular, no murmurs.  Abdomen: soft, BS +, non tender, non distended.  Extremities: no edema, lower extremities warm to touch Neurologic: Non Focal.   Skin: No Rash.   Wounds: N/A.    Intake/Output from previous day:  Intake/Output Summary (Last 24 hours) at 01/04/15 1418 Last data filed at 01/04/15 0900  Gross per 24 hour  Intake 2035.42 ml  Output      0 ml  Net  2035.42 ml     LAB RESULTS: CBC  Recent Labs Lab 19-Jan-2015 0119 01/02/15 0741  WBC 7.7 9.7  HGB 14.0 12.2*  HCT 41.7 37.4*  PLT 267 253  MCV 81.1 81.7  MCH 27.2 26.6  MCHC 33.6 32.6  RDW 12.6 13.0    Chemistries   Recent Labs Lab 01-19-2015 0119 01/02/15 0741 01/03/15 0641 01/04/15 0420  NA 138 140 139 138  K 3.9 3.5 3.6 3.7  CL 102 105 105 105  CO2 25 27 26 27   GLUCOSE 240* 134* 160* 149*  BUN 12 12 12 11   CREATININE 1.38* 1.29* 1.21 1.25*  CALCIUM 9.3 8.2* 7.9* 8.0*  MG  --   --   --  1.8    CBG:  Recent Labs Lab 01/03/15 2038  01/04/15 0005 01/04/15 0413 01/04/15 0745 01/04/15 1142  GLUCAP 99 117* 123* 110* 109*    GFR Estimated Creatinine Clearance: 121.2 mL/min (by C-G formula based on Cr of 1.25).  Coagulation profile No results for input(s): INR, PROTIME in the last 168 hours.  Cardiac Enzymes No results for input(s): CKMB, TROPONINI, MYOGLOBIN in the last 168 hours.  Invalid input(s): CK  Invalid input(s): POCBNP No results for input(s): DDIMER in the last 72 hours. No results for input(s): HGBA1C in the last 72 hours. No results for input(s): CHOL, HDL, LDLCALC, TRIG, CHOLHDL, LDLDIRECT in the last 72 hours. No results for input(s): TSH, T4TOTAL, T3FREE, THYROIDAB in the last 72 hours.  Invalid input(s): FREET3 No results for input(s): VITAMINB12, FOLATE, FERRITIN, TIBC, IRON, RETICCTPCT in the last 72 hours. No results for input(s): LIPASE, AMYLASE in the last 72 hours.  Urine Studies No results for input(s): UHGB, CRYS in the last 72 hours.  Invalid input(s): UACOL, UAPR, USPG, UPH, UTP, UGL, UKET, UBIL, UNIT, UROB, ULEU, UEPI, UWBC, URBC, UBAC, CAST, UCOM, BILUA  MICROBIOLOGY: No results found for this or any previous visit (from the past 240 hour(s)).  RADIOLOGY STUDIES/RESULTS: Dg Abd 2 Views  Jan 19, 2015  CLINICAL DATA:  Persistent abdominal pain and vomiting since yesterday. History of hypertension and diabetes. EXAM: ABDOMEN - 2 VIEW COMPARISON:  06/29/2014 FINDINGS: No bowel dilation is seen to suggest obstruction. There is no adynamic ileus or free air. Mild colonic stool burden is noted. Status post cholecystectomy, stable. No evidence of renal or ureteral stones. Bony structures are unremarkable. IMPRESSION: 1. No acute findings. No evidence of bowel obstruction, generalized adynamic ileus or free air. Electronically Signed   By: Lajean Manes M.D.   On: 19-Jan-2015 10:21    Oren Binet, MD  [facility] Pager:[phone]  If 7PM-7AM, please contact  night-coverage [email or web address] Password [email or web address] 01/04/2015, 2:18 PM   LOS: 3 days

## 2015-01-05 LAB — BASIC METABOLIC PANEL
ANION GAP: 5 (ref 5–15)
BUN: 12 mg/dL (ref 6–20)
CALCIUM: 7.9 mg/dL — AB (ref 8.9–10.3)
CO2: 27 mmol/L (ref 22–32)
Chloride: 107 mmol/L (ref 101–111)
Creatinine, Ser: 1.44 mg/dL — ABNORMAL HIGH (ref 0.61–1.24)
Glucose, Bld: 116 mg/dL — ABNORMAL HIGH (ref 65–99)
POTASSIUM: 3.6 mmol/L (ref 3.5–5.1)
Sodium: 139 mmol/L (ref 135–145)

## 2015-01-05 LAB — GLUCOSE, CAPILLARY
GLUCOSE-CAPILLARY: 109 mg/dL — AB (ref 65–99)
Glucose-Capillary: 147 mg/dL — ABNORMAL HIGH (ref 65–99)
Glucose-Capillary: 168 mg/dL — ABNORMAL HIGH (ref 65–99)
Glucose-Capillary: 207 mg/dL — ABNORMAL HIGH (ref 65–99)
Glucose-Capillary: 96 mg/dL (ref 65–99)

## 2015-01-05 LAB — C DIFFICILE QUICK SCREEN W PCR REFLEX
C DIFFICILE (CDIFF) INTERP: NEGATIVE
C DIFFICILE (CDIFF) TOXIN: NEGATIVE
C Diff antigen: NEGATIVE

## 2015-01-05 MED ORDER — TRAMADOL HCL 50 MG PO TABS: 50.0000 mg | ORAL_TABLET | Freq: Four times a day (QID) | ORAL | Status: DC | PRN

## 2015-01-05 MED ORDER — PANTOPRAZOLE SODIUM 40 MG PO TBEC
40.0000 mg | DELAYED_RELEASE_TABLET | Freq: Two times a day (BID) | ORAL | Status: DC
  Administered 2015-01-05: 40 mg via ORAL
  Filled 2015-01-05: qty 1

## 2015-01-05 MED ORDER — PROMETHAZINE HCL 25 MG PO TABS: 25.0000 mg | ORAL_TABLET | Freq: Three times a day (TID) | ORAL | Status: DC | PRN

## 2015-01-05 MED ORDER — METOCLOPRAMIDE HCL 10 MG PO TABS: 10.0000 mg | ORAL_TABLET | Freq: Three times a day (TID) | ORAL | Status: DC

## 2015-01-05 NOTE — Discharge Summary (Signed)
PATIENT DETAILS Name: Duane [name] Age: 39 y.o. Sex: male Date of Birth: [birth year] MRN: [id number]. Admitting Physician: Etta Quill, DO [id number], Lennox Laity, MD  Admit Date: 01/01/2015 Discharge date: 01/05/2015  Recommendations for Outpatient Follow-up:  1. Optimize long term glycemic control.  2. Please repeat BMET at next visit  PRIMARY DISCHARGE DIAGNOSIS:  Principal Problem:   Diabetic gastroparesis associated with type 1 diabetes mellitus ([place]) Active Problems:   DM type 1 (diabetes mellitus, type 1) ([place])   HTN (hypertension)      PAST MEDICAL HISTORY: Past Medical History  Diagnosis Date  . Hypertension Dx 2012  . Diabetes mellitus without complication [facility]) age 46    insulin requiring from the start  . Gastritis   . Gastroparesis 2012    100% gastric retention on 08/2013 [facility]  . Obesity   . Diabetic neuropathy ([place]) Dx 20110  . GERD (gastroesophageal reflux disease) Dx 2012  . Depression     DISCHARGE MEDICATIONS: Current Discharge Medication List    START taking these medications   Details  traMADol (ULTRAM) 50 MG tablet Take 1 tablet (50 mg total) by mouth every 6 (six) hours as needed for moderate pain or severe pain. Qty: 15 tablet, Refills: 0      CONTINUE these medications which have CHANGED   Details  metoCLOPramide (REGLAN) 10 MG tablet Take 1 tablet (10 mg total) by mouth 4 (four) times daily -  before meals and at bedtime. For 2 weeks and then as needed Qty: 60 tablet, Refills: 0    promethazine (PHENERGAN) 25 MG tablet Take 1 tablet (25 mg total) by mouth every 8 (eight) hours as needed for nausea or vomiting. Qty: 60 tablet, Refills: 0      CONTINUE these medications which have NOT CHANGED   Details  Blood Glucose Monitoring Suppl (TRUE METRIX METER) W/DEVICE KIT 1 each by Does not apply route 4 (four) times daily -  before meals and at bedtime. Qty: 1 kit, Refills: ea    gabapentin (NEURONTIN) 300 MG capsule Take 1 capsule  (300 mg total) by mouth at bedtime. Qty: 30 capsule, Refills: 3   Associated Diagnoses: Diabetic autonomic neuropathy associated with diabetes mellitus due to underlying condition (HCC)    glucose blood (TRUE METRIX BLOOD GLUCOSE TEST) test strip Use as instructed Qty: 100 each, Refills: 12    insulin aspart (NOVOLOG) 100 UNIT/ML FlexPen Inject 4 Units into the skin 3 (three) times daily with meals. (as per sliding scale) Qty: 15 mL, Refills: 3    insulin glargine (LANTUS) 100 UNIT/ML injection Inject 0.25 mLs (25 Units total) into the skin at bedtime. Qty: 10 mL, Refills: 3    Insulin Pen Needle 31G X 8 MM MISC Inject 1 each into the skin 3 (three) times daily. Check blood sugar TID and QHS Qty: 100 each, Refills: 3    metoprolol tartrate (LOPRESSOR) 50 MG tablet Take 1 tablet (50 mg total) by mouth 2 (two) times daily. Qty: 60 tablet, Refills: 2   Associated Diagnoses: Essential hypertension    pantoprazole (PROTONIX) 40 MG tablet Take 1 tablet (40 mg total) by mouth 2 (two) times daily. Qty: 60 tablet, Refills: 3    Probiotic Product (PRO-BIOTIC BLEND PO) Take 1 capsule by mouth daily.    sucralfate (CARAFATE) 1 G tablet Take 1 tablet (1 g total) by mouth 3 (three) times daily with meals. Qty: 90 tablet, Refills: 3    TRUEPLUS LANCETS 28G MISC 1 each by Does  not apply route 4 (four) times daily -  before meals and at bedtime. Qty: 100 each, Refills: 12        ALLERGIES:  No Known Allergies  BRIEF HPI:  See H&P, Labs, Consult and Test reports for all details in brief, Duane [name] is a 39 y.o. male with extensive history of diabetic gastroparesis due to DM1-who presented with intractable vomiting  CONSULTATIONS:   GI  PERTINENT RADIOLOGIC STUDIES: Dg Abd 2 Views  01/01/2015  CLINICAL DATA:  Persistent abdominal pain and vomiting since yesterday. History of hypertension and diabetes. EXAM: ABDOMEN - 2 VIEW COMPARISON:  06/29/2014 FINDINGS: No bowel dilation is seen to  suggest obstruction. There is no adynamic ileus or free air. Mild colonic stool burden is noted. Status post cholecystectomy, stable. No evidence of renal or ureteral stones. Bony structures are unremarkable. IMPRESSION: 1. No acute findings. No evidence of bowel obstruction, generalized adynamic ileus or free air. Electronically Signed   By: Lajean Manes M.D.   On: 01/01/2015 10:21     PERTINENT LAB RESULTS: CBC: No results for input(s): WBC, HGB, HCT, PLT in the last 72 hours. CMET CMP     Component Value Date/Time   NA 139 01/05/2015 0425   K 3.6 01/05/2015 0425   CL 107 01/05/2015 0425   CO2 27 01/05/2015 0425   GLUCOSE 116* 01/05/2015 0425   BUN 12 01/05/2015 0425   CREATININE 1.44* 01/05/2015 0425   CREATININE 1.17 09/13/2014 1229   CALCIUM 7.9* 01/05/2015 0425   PROT 7.5 01/01/2015 0119   ALBUMIN 4.3 01/01/2015 0119   AST 26 01/01/2015 0119   ALT 42 01/01/2015 0119   ALKPHOS 113 01/01/2015 0119   BILITOT 0.4 01/01/2015 0119   GFRNONAA >60 01/05/2015 0425   GFRAA >60 01/05/2015 0425    GFR Estimated Creatinine Clearance: 105.2 mL/min (by C-G formula based on Cr of 1.44). No results for input(s): LIPASE, AMYLASE in the last 72 hours. No results for input(s): CKTOTAL, CKMB, CKMBINDEX, TROPONINI in the last 72 hours. Invalid input(s): POCBNP No results for input(s): DDIMER in the last 72 hours. No results for input(s): HGBA1C in the last 72 hours. No results for input(s): CHOL, HDL, LDLCALC, TRIG, CHOLHDL, LDLDIRECT in the last 72 hours. No results for input(s): TSH, T4TOTAL, T3FREE, THYROIDAB in the last 72 hours.  Invalid input(s): FREET3 No results for input(s): VITAMINB12, FOLATE, FERRITIN, TIBC, IRON, RETICCTPCT in the last 72 hours. Coags: No results for input(s): INR in the last 72 hours.  Invalid input(s): PT Microbiology: Recent Results (from the past 240 hour(s))  C difficile quick scan w PCR reflex     Status: None   Collection Time: 01/04/15 11:01 PM    Result Value Ref Range Status   C Diff antigen NEGATIVE NEGATIVE Final   C Diff toxin NEGATIVE NEGATIVE Final   C Diff interpretation Negative for toxigenic C. difficile  Final     BRIEF HOSPITAL COURSE:  Diabetic gastroparesis Flare:Presented with intractable vomiting-improved with IV Erythromycin, scheduled IV Reglan and Zofran-belly is benign on exam. Diet slowly advanced, tolerated soft diet on discharge-requesting discharge today-as he wants to go home for thanksgiving. Will d/c at patient's request-continue Reglan on discharge. Follow up with PCP in 1 week. Counseled regarding importance of long term glycemic control  Active Problems: [id number] pre-renal Azotemia-secondary to above-creatinine slightly elevated at 1.4-patient anxious to go home-instructed to keep himself well hydrated-and follow up with PCP in 1 week to recheck labs.  DM type 1 (diabetes  mellitus, type 1):CBGs controlled-Continue Lantus and Novolog on discharge. He is aware of importance of long term strict glycemic control. Follow up with PCP   HTN (hypertension):Controlled with metoprolol 5 mg every 8 hours during his stay here-will transition to oral metoprolol on discharge.     GERD: Continue PPI  TODAY-DAY OF DISCHARGE:  Subjective:   Brylon Brenning today has no headache,no chest abdominal pain,no new weakness tingling or numbness, feels much better wants to go home today.   Objective:   Blood pressure 125/85, pulse 69, temperature 97.9 F (36.6 C), temperature source Oral, resp. rate 18, height _0  (1.905 m), weight 140.615 kg (310 lb), SpO2 95 %.  Intake/Output Summary (Last 24 hours) at 01/05/15 1127 Last data filed at 01/05/15 0428  Gross per 24 hour  Intake    810 ml  Output      0 ml  Net    810 ml   Filed Weights   01/04/15 2113  Weight: 140.615 kg (310 lb)    Exam Awake Alert, Oriented *3, No new F.N deficits, Normal affect [place].AT,PERRAL Supple Neck,No JVD, No cervical  lymphadenopathy appriciated.  Symmetrical Chest wall movement, Good air movement bilaterally, CTAB RRR,No Gallops,Rubs or new Murmurs, No Parasternal Heave +ve B.Sounds, Abd Soft, Non tender, No organomegaly appriciated, No rebound -guarding or rigidity. No Cyanosis, Clubbing or edema, No new Rash or bruise  DISCHARGE CONDITION: Stable  DISPOSITION: Home  DISCHARGE INSTRUCTIONS:    Activity:  As tolerated   Follow with Primary MD  Minerva Ends, MD   as instructed your Hospitalist MD  Please get a complete blood count and chemistry panel checked by your Primary MD at your next visit, and again as instructed by your Primary MD.  Avoid all NSAID's (Motrin/Advil) till your kidney function normalizes  Keep yourself well hydrated till seen by your PCP  Get Medicines reviewed and adjusted: Please take all your medications with you for your next visit with your Primary MD  Please request your Primary MD to go over all hospital tests and procedure/radiological results at the follow up, please ask your Primary MD to get all Hospital records sent to his/her office.  If you experience worsening of your admission symptoms, develop shortness of breath, life threatening emergency, suicidal or homicidal thoughts you must seek medical attention immediately by calling 911 or calling your MD immediately  if symptoms less severe.  You must read complete instructions/literature along with all the possible adverse reactions/side effects for all the Medicines you take and that have been prescribed to you. Take any new Medicines after you have completely understood and accpet all the possible adverse reactions/side effects.   Do not drive when taking Pain medications.   Do not take more than prescribed Pain, Sleep and Anxiety Medications  Special Instructions: If you have smoked or chewed Tobacco  in the last 2 yrs please stop smoking, stop any regular Alcohol  and or any Recreational drug  use.  Wear Seat belts while driving.  Please note  You were cared for by a hospitalist during your hospital stay. Once you are discharged, your primary care physician will handle any further medical issues. Please note that NO REFILLS for any discharge medications will be authorized once you are discharged, as it is imperative that you return to your primary care physician (or establish a relationship with a primary care physician if you do not have one) for your aftercare needs so that they can reassess your need for  medications and monitor your lab values.   Diet recommendation: Diabetic Diet Heart Healthy diet  Discharge Instructions    Call MD for:  persistant nausea and vomiting    Complete by:  As directed      Diet - low sodium heart healthy    Complete by:  As directed      Diet Carb Modified    Complete by:  As directed      Increase activity slowly    Complete by:  As directed            Follow-up Information    Follow up with Minerva Ends, MD. Schedule an appointment as soon as possible for a visit in 1 week.   Specialty:  Family Medicine   Why:  please ask your Primary MD to get a BMET   Contact information:   [address] [place] [address] [phone]       Total Time spent on discharge equals 45 minutes.  SignedOren Binet 01/05/2015 11:27 AM

## 2015-01-05 NOTE — Care Management Note (Signed)
Case Management Note  Patient Details  Name: Duane [name] MRN: [id number] Date of Birth: [birth year]  Subjective/Objective:                    Action/Plan:   Expected Discharge Date:                  Expected Discharge Plan:  Home/Self Care  In-House Referral:     Discharge planning Services     Post Acute Care Choice:    Choice offered to:     DME Arranged:    DME Agency:     HH Arranged:    HH Agency:     Status of Service:  In process, will continue to follow  Medicare Important Message Given:    Date Medicare IM Given:    Medicare IM give by:    Date Additional Medicare IM Given:    Additional Medicare Important Message give by:     If discussed at [place] of Stay Meetings, dates discussed:    Additional Comments: UR updated  Marilu Favre, RN 01/05/2015, 10:36 AM

## 2015-01-05 NOTE — Progress Notes (Signed)
No further vomiting-tolerating liquids-creatinine up to 1.4-patient wants to go home for thanksgiving-"been through this 50 times". At patient's request-advanced to soft diet-low portion-if tolerates-will discharge. Patient indicates he will keep himself hydrated well and will follow up with his PCP in 1 week to get BMET. RN aware of plan-will alert me if patient tolerates soft diet-full note to follow.

## 2015-01-05 NOTE — Discharge Instructions (Signed)
Follow with Primary MD  Minerva Ends, MD   as instructed your Hospitalist MD  Please get a complete blood count and chemistry panel checked by your Primary MD at your next visit, and again as instructed by your Primary MD.  Avoid all NSAID's (Motrin/Advil) till your kidney function normalizes  Keep yourself well hydrated till seen by your PCP  Get Medicines reviewed and adjusted. Please take all your medications with you for your next visit with your Primary MD  Please request your Primary MD to go over all hospital tests and procedure/radiological results at the follow up, please ask your Primary MD to get all Hospital records sent to his/her office.  If you experience worsening of your admission symptoms, develop shortness of breath, life threatening emergency, suicidal or homicidal thoughts you must seek medical attention immediately by calling 911 or calling your MD immediately  if symptoms less severe.  You must read complete instructions/literature along with all the possible adverse reactions/side effects for all the Medicines you take and that have been prescribed to you. Take any new Medicines after you have completely understood and accpet all the possible adverse reactions/side effects.   Do not drive when taking Pain medications or sleeping medications (Benzodaizepines)  Do not take more than prescribed Pain, Sleep and Anxiety Medications  Special Instructions: If you have smoked or chewed Tobacco  in the last 2 yrs please stop smoking, stop any regular Alcohol  and or any Recreational drug use.  Wear Seat belts while driving.  Please note  You were cared for by a hospitalist during your hospital stay. Once you are discharged, your primary care physician will handle any further medical issues. Please note that NO REFILLS for any discharge medications will be authorized once you are discharged, as it is imperative that you return to your primary care physician (or  establish a relationship with a primary care physician if you do not have one) for your aftercare needs so that they can reassess your need for medications and monitor your lab values.

## 2015-01-05 NOTE — Progress Notes (Signed)
PICC line removed by IV team. Discharge paperwork given to patient. Prescriptions given to patient. No questions verbalized. Patient is awaiting his ride to discharge home.

## 2015-01-28 ENCOUNTER — Ambulatory Visit: Payer: Self-pay | Admitting: Family Medicine

## 2015-02-18 MED FILL — ?PANTOPRAZOLE SOD DR 40MG: 40 MG | 30 days supply | Qty: 60 | Fill #0

## 2015-02-18 MED FILL — METOPROLOL TARTRATE 50 MG T: 50 | 30 days supply | Qty: 60 | Fill #0

## 2015-02-23 MED FILL — METOCLOPRAMIDE 10 MG TABLET: 10 | 30 days supply | Qty: 42 | Fill #0

## 2015-02-23 MED FILL — SUCRALFATE 1 GM TABLET: 1 | 30 days supply | Qty: 90 | Fill #0

## 2015-02-28 ENCOUNTER — Other Ambulatory Visit: Payer: Self-pay | Admitting: Family Medicine

## 2015-03-09 ENCOUNTER — Other Ambulatory Visit: Payer: Self-pay | Admitting: Family Medicine

## 2015-03-09 MED FILL — TRUE METRIX TEST STRIP: 12 days supply | Qty: 50 | Fill #3

## 2015-03-09 MED FILL — GABAPENTIN 300 MG CAPSULE: 300 | 30 days supply | Qty: 30 | Fill #0

## 2015-03-24 MED FILL — METOCLOPRAMIDE 10 MG TABLET: 10 | 30 days supply | Qty: 90 | Fill #1

## 2015-03-25 MED FILL — TRUE METRIX TEST STRIP: 12 days supply | Qty: 50 | Fill #4

## 2015-03-25 MED FILL — METOPROLOL TARTRATE 50 MG T: 50 | 30 days supply | Qty: 60 | Fill #1

## 2015-04-05 ENCOUNTER — Ambulatory Visit: Payer: Self-pay | Admitting: Family Medicine

## 2015-04-05 MED FILL — PANTOPRAZOLE SOD DR 40 MG T: 40 | 30 days supply | Qty: 60 | Fill #0

## 2015-04-12 MED FILL — SUCRALFATE 1 GM TABLET: 1 | 30 days supply | Qty: 90 | Fill #1

## 2015-04-12 MED FILL — GABAPENTIN 300 MG CAPSULE: 300 | 30 days supply | Qty: 30 | Fill #1

## 2015-04-13 MED FILL — !NOVOLOG FLEXPEN SYRINGE 1: 100/ML | 30 days supply | Qty: 6 | Fill #2

## 2015-04-13 MED FILL — LANTUS 100 UNITS/ML VIAL: 100 | 40 days supply | Qty: 10 | Fill #3

## 2015-04-14 MED FILL — METOCLOPRAMIDE 10 MG TABLET: 10 | 30 days supply | Qty: 90 | Fill #2

## 2015-04-22 ENCOUNTER — Ambulatory Visit: Payer: Medicaid Other | Attending: Family Medicine | Admitting: Family Medicine

## 2015-04-22 ENCOUNTER — Encounter: Payer: Self-pay | Admitting: Family Medicine

## 2015-04-22 ENCOUNTER — Ambulatory Visit (HOSPITAL_BASED_OUTPATIENT_CLINIC_OR_DEPARTMENT_OTHER): Payer: Self-pay | Admitting: Clinical

## 2015-04-22 VITALS — BP 126/86 | HR 77 | Temp 98.1°F | Resp 16 | Ht 75.0 in | Wt 308.0 lb

## 2015-04-22 DIAGNOSIS — Z794 Long term (current) use of insulin: Secondary | ICD-10-CM | POA: Insufficient documentation

## 2015-04-22 DIAGNOSIS — N529 Male erectile dysfunction, unspecified: Secondary | ICD-10-CM

## 2015-04-22 DIAGNOSIS — M7989 Other specified soft tissue disorders: Secondary | ICD-10-CM | POA: Insufficient documentation

## 2015-04-22 DIAGNOSIS — R197 Diarrhea, unspecified: Secondary | ICD-10-CM | POA: Insufficient documentation

## 2015-04-22 DIAGNOSIS — F329 Major depressive disorder, single episode, unspecified: Secondary | ICD-10-CM | POA: Insufficient documentation

## 2015-04-22 DIAGNOSIS — F322 Major depressive disorder, single episode, severe without psychotic features: Secondary | ICD-10-CM

## 2015-04-22 DIAGNOSIS — N521 Erectile dysfunction due to diseases classified elsewhere: Secondary | ICD-10-CM | POA: Insufficient documentation

## 2015-04-22 DIAGNOSIS — K529 Noninfective gastroenteritis and colitis, unspecified: Secondary | ICD-10-CM

## 2015-04-22 DIAGNOSIS — Z9049 Acquired absence of other specified parts of digestive tract: Secondary | ICD-10-CM | POA: Insufficient documentation

## 2015-04-22 DIAGNOSIS — E1042 Type 1 diabetes mellitus with diabetic polyneuropathy: Secondary | ICD-10-CM | POA: Insufficient documentation

## 2015-04-22 DIAGNOSIS — F418 Other specified anxiety disorders: Secondary | ICD-10-CM

## 2015-04-22 DIAGNOSIS — B351 Tinea unguium: Secondary | ICD-10-CM | POA: Insufficient documentation

## 2015-04-22 DIAGNOSIS — M25462 Effusion, left knee: Secondary | ICD-10-CM

## 2015-04-22 DIAGNOSIS — Z79899 Other long term (current) drug therapy: Secondary | ICD-10-CM | POA: Insufficient documentation

## 2015-04-22 DIAGNOSIS — K3184 Gastroparesis: Secondary | ICD-10-CM | POA: Insufficient documentation

## 2015-04-22 DIAGNOSIS — E108 Type 1 diabetes mellitus with unspecified complications: Secondary | ICD-10-CM | POA: Insufficient documentation

## 2015-04-22 DIAGNOSIS — M25562 Pain in left knee: Secondary | ICD-10-CM

## 2015-04-22 LAB — POCT GLYCOSYLATED HEMOGLOBIN (HGB A1C): HEMOGLOBIN A1C: 9.4

## 2015-04-22 LAB — GLUCOSE, POCT (MANUAL RESULT ENTRY): POC GLUCOSE: 172 mg/dL — AB (ref 70–99)

## 2015-04-22 MED ORDER — SILDENAFIL CITRATE 100 MG PO TABS: 50.0000 mg | ORAL_TABLET | Freq: Every day | ORAL | Status: DC | PRN

## 2015-04-22 MED ORDER — TRAMADOL HCL 50 MG PO TABS
50.0000 mg | ORAL_TABLET | Freq: Four times a day (QID) | ORAL | Status: DC | PRN
Start: 2015-04-22 — End: 2015-10-17

## 2015-04-22 MED ORDER — BUPROPION HCL ER (XL) 150 MG PO TB24: 150.0000 mg | ORAL_TABLET | Freq: Every day | ORAL | Status: DC

## 2015-04-22 MED ORDER — DIPHENOXYLATE-ATROPINE 2.5-0.025 MG PO TABS: 1.0000 | ORAL_TABLET | Freq: Four times a day (QID) | ORAL | Status: DC | PRN

## 2015-04-22 NOTE — Progress Notes (Signed)
Subjective:  Patient ID: Duane [name], male    DOB: 1975-09-03  Age: 40 y.o. MRN: [id number]  CC: Diarrhea and Depression   HPI Jonathen Rathman presents for    1. Diarrhea: x 3 months. Usually in early mornings. Watery to soft bowel movements. Last 4-5 hrs up to all day. No identified food triggers. Sometimes cheeses or dairy items cause pain. Has hx of cholecystectomy. Diarrhea is associated with gas pain. Bloating. No fever. No blood in stool. Denies constipation. Has IDDM type 1 with gastroparesis. ? IBS. No family history of IBS/IBD or lactose intolerance. He has decreased gas producing foods and lactose from his diet. This has not helped him much. He has darks stools at times. No frank blood.    2. CHRONIC DIABETES  Disease Monitoring  Blood Sugar Ranges: 99 fasting-400  Polyuria: no   Visual problems: yes   Medication Compliance: yes  Medication Side Effects  Hypoglycemia: no   Preventitive Health Care  Eye Exam: due   Foot Exam: done today   Diet pattern: eats irregular meals, eating fried foods, eating a lot   Exercise: yes   3. Stress: stressed and trouble sleeping x one year. Feeling depressed. Does not sleep at night. No history of serious depression. Not feeling like himself. Under a lot of tension. He is depressed about erectile dysfunction. He is having trouble getting and keeping erection. He is also depressed that he moved to [place] for [place] to live with his parents two years ago, because of frequent illness, he is still living with his parents, his parents are asking him to move out.   Social History  Substance Use Topics  . Smoking status: Never Smoker   . Smokeless tobacco: Never Used  . Alcohol Use: No    Outpatient Prescriptions Prior to Visit  Medication Sig Dispense Refill  . Blood Glucose Monitoring Suppl (TRUE METRIX METER) W/DEVICE KIT 1 each by Does not apply route 4 (four) times daily -  before meals and at bedtime. 1 kit ea  . gabapentin  (NEURONTIN) 300 MG capsule TAKE 1 CAPSULE BY MOUTH AT BEDTIME. 30 capsule 2  . glucose blood (TRUE METRIX BLOOD GLUCOSE TEST) test strip Use as instructed 100 each 12  . insulin aspart (NOVOLOG) 100 UNIT/ML FlexPen Inject 4 Units into the skin 3 (three) times daily with meals. (as per sliding scale) 15 mL 3  . insulin glargine (LANTUS) 100 UNIT/ML injection Inject 0.25 mLs (25 Units total) into the skin at bedtime. 10 mL 3  . Insulin Pen Needle 31G X 8 MM MISC Inject 1 each into the skin 3 (three) times daily. Check blood sugar TID and QHS 100 each 3  . metoCLOPramide (REGLAN) 10 MG tablet Take 1 tablet (10 mg total) by mouth 4 (four) times daily -  before meals and at bedtime. For 2 weeks and then as needed 60 tablet 0  . metoprolol tartrate (LOPRESSOR) 50 MG tablet Take 1 tablet (50 mg total) by mouth 2 (two) times daily. 60 tablet 2  . pantoprazole (PROTONIX) 40 MG tablet TAKE 1 TABLET BY MOUTH 2 TIMES DAILY 60 tablet 2  . Probiotic Product (PRO-BIOTIC BLEND PO) Take 1 capsule by mouth daily.    . promethazine (PHENERGAN) 25 MG tablet Take 1 tablet (25 mg total) by mouth every 8 (eight) hours as needed for nausea or vomiting. 60 tablet 0  . sucralfate (CARAFATE) 1 G tablet Take 1 tablet (1 g total) by mouth 3 (three) times  daily with meals. 90 tablet 3  . TRUEPLUS LANCETS 28G MISC 1 each by Does not apply route 4 (four) times daily -  before meals and at bedtime. 100 each 12  . traMADol (ULTRAM) 50 MG tablet Take 1 tablet (50 mg total) by mouth every 6 (six) hours as needed for moderate pain or severe pain. (Patient not taking: Reported on 04/22/2015) 15 tablet 0   No facility-administered medications prior to visit.    ROS Review of Systems  Constitutional: Negative for fever, chills, fatigue and unexpected weight change.  Eyes: Positive for visual disturbance.  Respiratory: Negative for cough and shortness of breath.   Cardiovascular: Negative for chest pain, palpitations and leg swelling.    Gastrointestinal: Positive for abdominal pain and diarrhea. Negative for nausea, vomiting, constipation and blood in stool.  Endocrine: Negative for polydipsia, polyphagia and polyuria.  Musculoskeletal: Positive for joint swelling and arthralgias. Negative for myalgias, back pain, gait problem and neck pain.  Skin: Negative for rash.  Allergic/Immunologic: Negative for immunocompromised state.  Hematological: Negative for adenopathy. Does not bruise/bleed easily.  Psychiatric/Behavioral: Positive for sleep disturbance and dysphoric mood. Negative for suicidal ideas. The patient is nervous/anxious.     Objective:  BP 126/86 mmHg  Pulse 77  Temp(Src) 98.1 F (36.7 C) (Oral)  Resp 16  Ht _0  (1.905 m)  Wt 308 lb (139.708 kg)  BMI 38.50 kg/m2  SpO2 99%  BP/Weight 04/22/2015 01/05/2015 71/24/5809  Systolic BP 983 382 -  Diastolic BP 86 85 -  Wt. (Lbs) 308 - 310  BMI 38.5 - 38.75    Physical Exam  Constitutional: He appears well-developed and well-nourished. No distress.  HENT:  Head: Normocephalic and atraumatic.  Neck: Normal range of motion. Neck supple.  Cardiovascular: Normal rate, regular rhythm, normal heart sounds and intact distal pulses.   Pulmonary/Chest: Effort normal and breath sounds normal.  Musculoskeletal: He exhibits no edema.  Neurological: He is alert.  Skin: Skin is warm and dry. No rash noted. No erythema.     Psychiatric: He has a normal mood and affect.    Lab Results  Component Value Date   HGBA1C 10.1* 01/01/2015   Lab Results  Component Value Date   HGBA1C 9.4 04/22/2015    CBG 172 Assessment & Plan:   Aadon was seen today for diarrhea and depression.  Diagnoses and all orders for this visit:  Type 1 diabetes mellitus with diabetic polyneuropathy (HCC) -     HgB A1c -     Glucose (CBG) -     Ambulatory referral to Ophthalmology  Chronic diarrhea -     Ambulatory referral to Gastroenterology -     diphenoxylate-atropine  (LOMOTIL) 2.5-0.025 MG tablet; Take 1 tablet by mouth 4 (four) times daily as needed for diarrhea or loose stools. -     CBC; Future  Erectile dysfunction, unspecified erectile dysfunction type -     sildenafil (VIAGRA) 100 MG tablet; Take 0.5-1 tablets (50-100 mg total) by mouth daily as needed for erectile dysfunction. -     Testosterone; Future  MDD (major depressive disorder), single episode, severe , no psychosis (HCC) -     buPROPion (WELLBUTRIN XL) 150 MG 24 hr tablet; Take 1 tablet (150 mg total) by mouth daily.  Onychomycosis of toenail -     COMPLETE METABOLIC PANEL WITH GFR; Future  Pain and swelling of left knee -     traMADol (ULTRAM) 50 MG tablet; Take 1 tablet (50 mg total) by  mouth every 6 (six) hours as needed for moderate pain or severe pain. -     DG Knee 4 Views W/Patella Left; Future -     Ambulatory referral to Orthopedic Surgery      No orders of the defined types were placed in this encounter.    Follow-up: No Follow-up on file.   Boykin Nearing MD

## 2015-04-22 NOTE — Patient Instructions (Addendum)
Duane [name] was seen today for diarrhea and depression.  Diagnoses and all orders for this visit:  Type 1 diabetes mellitus with diabetic polyneuropathy (HCC) -     HgB A1c -     Glucose (CBG) -     Ambulatory referral to Ophthalmology  Chronic diarrhea -     Ambulatory referral to Gastroenterology -     diphenoxylate-atropine (LOMOTIL) 2.5-0.025 MG tablet; Take 1 tablet by mouth 4 (four) times daily as needed for diarrhea or loose stools. -     CBC; Future  Erectile dysfunction, unspecified erectile dysfunction type -     sildenafil (VIAGRA) 100 MG tablet; Take 0.5-1 tablets (50-100 mg total) by mouth daily as needed for erectile dysfunction. -     Testosterone; Future  MDD (major depressive disorder), single episode, severe , no psychosis (HCC) -     buPROPion (WELLBUTRIN XL) 150 MG 24 hr tablet; Take 1 tablet (150 mg total) by mouth daily.  Onychomycosis of toenail -     COMPLETE METABOLIC PANEL WITH GFR; Future   F/u for labs within the next week F/u with me for depression in 4 weeks   Dr. Adrian Blackwater

## 2015-04-22 NOTE — Progress Notes (Signed)
ASSESSMENT: Pt currently experiencing symptoms of anxiety and depression. Pt needs to f/u with PCP and [facility]; would benefit from Solution-Focused therapeutic interventions regarding coping with symptoms of anxiety and depression. Stage of Change: contemplative  PLAN: 1. F/U with behavioral health consultant in 2 weeks 2. Psychiatric Medications: Wellbutrin (start today). 3. Behavioral recommendation(s):   -Consider walk-in [employer] at [employer] -Consider reading educational material regarding coping with symptoms of anxiety and depression SUBJECTIVE: Pt. referred by Dr Adrian Blackwater for symptoms of anxiety and depression:  Pt. reports the following symptoms/concerns: Pt states that he has experienced depression in the past, but has not been treated previously; he feels his changing health condition has been a difficult adjustment. Pt says his primary worry is finding independent housing (lives with parents) on only disability check; Medicaid was cut off because of disability check. Duration of problem: At least 3 months Severity: severe  OBJECTIVE: Orientation & Cognition: Oriented x3. Thought processes normal and appropriate to situation. Mood: appropriate. Affect: appropriate Appearance: appropriate Risk of harm to self or others: no known risk of harm to self or others, no SI  Substance use: none Assessments administered: PHQ9: 19/ GAD7: 16  Diagnosis: Depression and anxiety CPT Code: F41.8 -------------------------------------------- Other(s) present in the room: none  Time spent with patient in exam room: 20 minutes, 5:50-6:10pm    Depression screen [facility] 2/9 04/22/2015 12/30/2014 09/17/2014 09/13/2014 07/26/2014  Decreased Interest 2 0 0 0 1  Down, Depressed, Hopeless 3 0 0 0 2  PHQ - 2 Score 5 0 0 0 3  Altered sleeping 3 - - - 2  Tired, decreased energy 2 - - - 2  Change in appetite 2 - - - 1  Feeling bad or failure about yourself  2 - - - 2  Trouble  concentrating 2 - - - 1  Moving slowly or fidgety/restless 2 - - - 1  Suicidal thoughts 1 - - - 1  PHQ-9 Score 19 - - - 13    04-22-15: GAD7 16 (2,3,3,2,1,2,3)

## 2015-04-22 NOTE — Progress Notes (Signed)
F/U DM  Complaining of diarrhea and abdominal discomfort No tobacco user  No suicidal thoughts in the past two weeks No pain today

## 2015-04-25 ENCOUNTER — Telehealth: Payer: Self-pay | Admitting: Clinical

## 2015-04-25 ENCOUNTER — Telehealth: Payer: Self-pay | Admitting: Gastroenterology

## 2015-04-25 NOTE — Assessment & Plan Note (Signed)
A; major depression P: wellbutrin

## 2015-04-25 NOTE — Telephone Encounter (Signed)
Left message at [phone], as requested by pt at last visit: He does not currently have [place] discount, and it is recommended that Duane [name] call [phone] to make appointment to meet with [employer] financial counselors, to apply for both [facility] discount.

## 2015-04-25 NOTE — Assessment & Plan Note (Signed)
CMP today Plan for lamisil in future Adding multiple meds today, so no lamisil at this time

## 2015-04-25 NOTE — Assessment & Plan Note (Signed)
A; ED P: viagra

## 2015-04-25 NOTE — Telephone Encounter (Signed)
A user error has taken place.

## 2015-04-25 NOTE — Assessment & Plan Note (Signed)
A: persistent diarrhea. No blood in stool. No change in weight P: GI referral  Lomotil

## 2015-04-25 NOTE — Assessment & Plan Note (Signed)
A: improved.  P:  Continue current regime Ophthalmology referral

## 2015-04-28 ENCOUNTER — Other Ambulatory Visit: Payer: Self-pay

## 2015-05-06 MED FILL — TRUE METRIX TEST STRIP: 12 days supply | Qty: 50 | Fill #5

## 2015-05-18 MED FILL — GABAPENTIN 300 MG CAPSULE: 300 | 30 days supply | Qty: 30 | Fill #2

## 2015-05-18 MED FILL — !VIAGRA 100MG TABLET: 100 | 30 days supply | Qty: 5 | Fill #0

## 2015-05-23 ENCOUNTER — Other Ambulatory Visit: Payer: Self-pay | Admitting: Physician Assistant

## 2015-05-23 MED FILL — ?PANTOPRAZOLE SOD DR 40MG: 40 MG | 30 days supply | Qty: 60 | Fill #1

## 2015-05-23 MED FILL — !NOVOLOG FLEXPEN SYRINGE 1: 100/ML | 30 days supply | Qty: 6 | Fill #3

## 2015-05-23 MED FILL — ?METOPROLOL 50 MG TABLET: 50 | 30 days supply | Qty: 60 | Fill #2

## 2015-05-24 MED FILL — METOCLOPRAMIDE 10 MG TABLET: 10 | 30 days supply | Qty: 90 | Fill #0

## 2015-05-30 ENCOUNTER — Telehealth: Payer: Self-pay | Admitting: Clinical

## 2015-05-30 NOTE — Telephone Encounter (Signed)
Attempt to f/u with pt; no voicemail set up, no message left

## 2015-06-01 ENCOUNTER — Other Ambulatory Visit: Payer: Self-pay | Admitting: Family Medicine

## 2015-06-01 MED FILL — TRUE METRIX TEST STRIP: 12 days supply | Qty: 50 | Fill #6

## 2015-06-01 MED FILL — !LANTUS 100 UNITS/ML VIAL: 100 | 30 days supply | Qty: 10 | Fill #0

## 2015-06-08 ENCOUNTER — Other Ambulatory Visit: Payer: Self-pay | Admitting: *Deleted

## 2015-06-08 MED ORDER — INSULIN GLARGINE 100 UNIT/ML ~~LOC~~ SOLN: SUBCUTANEOUS | Status: DC

## 2015-06-15 ENCOUNTER — Other Ambulatory Visit: Payer: Self-pay | Admitting: Physician Assistant

## 2015-06-15 DIAGNOSIS — K3184 Gastroparesis: Principal | ICD-10-CM

## 2015-06-15 DIAGNOSIS — E1143 Type 2 diabetes mellitus with diabetic autonomic (poly)neuropathy: Secondary | ICD-10-CM

## 2015-06-23 MED FILL — GABAPENTIN 300 MG CAPSULE: 300 | 30 days supply | Qty: 30 | Fill #3

## 2015-06-27 MED FILL — SUCRALFATE 1 GM TABLET: 1 | 30 days supply | Qty: 90 | Fill #0

## 2015-06-27 MED FILL — TRUE METRIX TEST STRIP: 12 days supply | Qty: 50 | Fill #7

## 2015-07-06 MED FILL — !NOVOLOG FLEXPEN SYRINGE 1: 100/ML | 30 days supply | Qty: 6 | Fill #4

## 2015-07-06 MED FILL — ?PANTOPRAZOLE SOD DR 40MG: 40 MG | 30 days supply | Qty: 60 | Fill #2

## 2015-07-06 MED FILL — METOCLOPRAMIDE 10 MG TABLET: 10 | 30 days supply | Qty: 90 | Fill #1

## 2015-07-07 MED FILL — !LANTUS SOLOSTAR 100UNITS/M: 100 | 24 days supply | Qty: 6 | Fill #0

## 2015-07-18 ENCOUNTER — Other Ambulatory Visit: Payer: Self-pay | Admitting: Family Medicine

## 2015-07-18 MED ORDER — INSULIN ASPART 100 UNIT/ML FLEXPEN: 4.0000 [IU] | PEN_INJECTOR | Freq: Three times a day (TID) | SUBCUTANEOUS | Status: DC

## 2015-07-26 ENCOUNTER — Other Ambulatory Visit: Payer: Self-pay | Admitting: Family Medicine

## 2015-07-26 MED FILL — TRUE METRIX TEST STRIP: 12 days supply | Qty: 50 | Fill #8

## 2015-07-26 MED FILL — ?METOPROLOL 50 MG TABLET: 50 | 30 days supply | Qty: 60 | Fill #0

## 2015-07-29 ENCOUNTER — Other Ambulatory Visit: Payer: Self-pay | Admitting: Family Medicine

## 2015-07-29 MED FILL — GABAPENTIN 300 MG CAPSULE: 300 | 30 days supply | Qty: 30 | Fill #0

## 2015-08-01 MED FILL — !LANTUS SOLOSTAR 100UNITS/M: 100 | 24 days supply | Qty: 6 | Fill #1

## 2015-08-15 IMAGING — CR DG ABD PORTABLE 1V
1 series · 1 of 1 positions shown · non-contrast
Comparison: 06/07/2013

CLINICAL DATA: Nausea and vomiting

EXAM:
PORTABLE ABDOMEN - 1 VIEW

[AP]
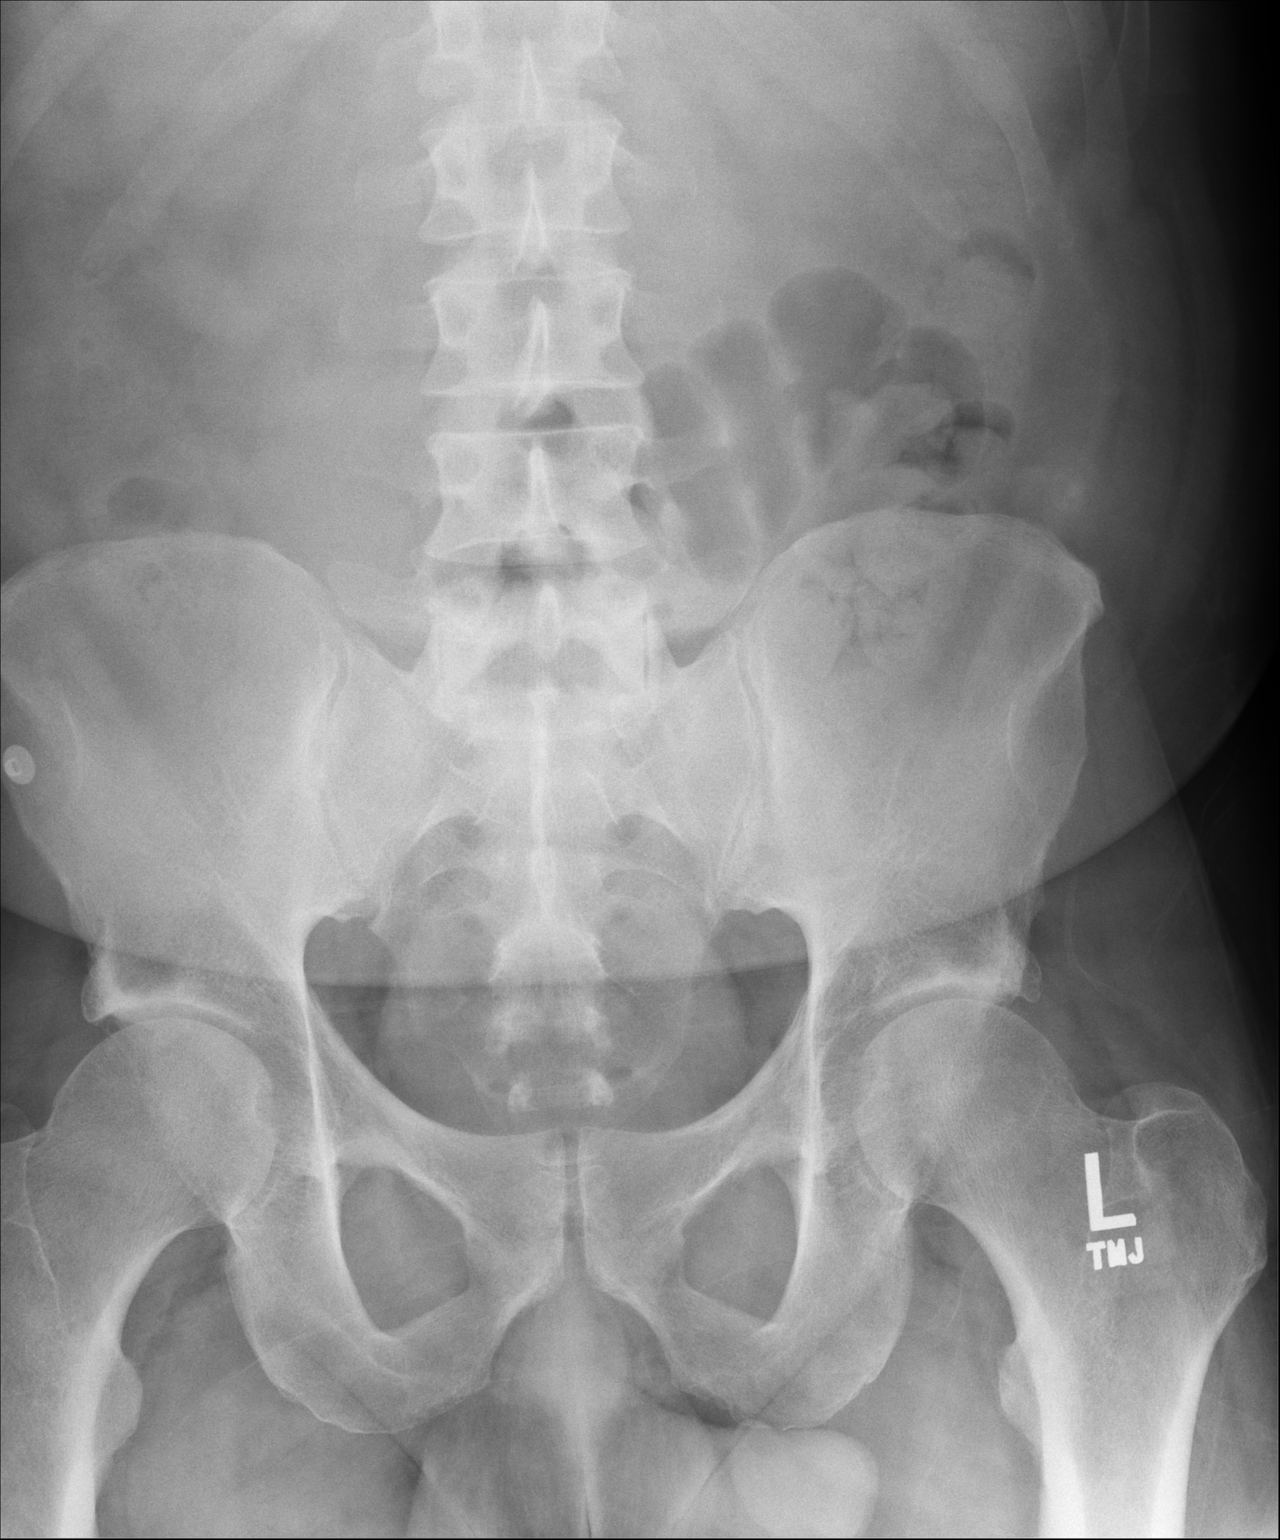

[1 of 1 positions shown; findings below may reference images not displayed]

FINDINGS: The bowel gas pattern is normal. No radio-opaque calculi or other
significant radiographic abnormality are seen.
IMPRESSION: Negative.

## 2015-08-17 ENCOUNTER — Other Ambulatory Visit: Payer: Self-pay | Admitting: Family Medicine

## 2015-08-17 MED FILL — METOCLOPRAMIDE 10 MG TABLET: 10 | 30 days supply | Qty: 90 | Fill #2

## 2015-08-17 MED FILL — !LANTUS SOLOSTAR 100UNITS/M: 100 | 24 days supply | Qty: 6 | Fill #2

## 2015-08-18 MED FILL — ?PANTOPRAZOLE SOD DR 40MG: 40 MG | 30 days supply | Qty: 60 | Fill #0

## 2015-08-19 ENCOUNTER — Other Ambulatory Visit: Payer: Self-pay | Admitting: Family Medicine

## 2015-08-19 MED FILL — !NOVOLOG FLEXPEN SYRINGE 1: 100/ML | 25 days supply | Qty: 3 | Fill #0

## 2015-08-19 MED FILL — TRUE METRIX TEST STRIP: 24 days supply | Qty: 100 | Fill #9

## 2015-09-13 MED FILL — METOPROLOL TARTRATE 50 MG T: 50 | 30 days supply | Qty: 60 | Fill #1

## 2015-09-13 MED FILL — SUCRALFATE 1 GM TABLET: 1 | 30 days supply | Qty: 90 | Fill #1

## 2015-09-13 MED FILL — GABAPENTIN 300 MG CAPSULE: 300 | 30 days supply | Qty: 30 | Fill #1

## 2015-09-23 MED FILL — ?PANTOPRAZOLE SOD DR 40MG: 40 MG | 30 days supply | Qty: 60 | Fill #1

## 2015-09-23 MED FILL — !LANTUS SOLOSTAR 100UNITS/M: 100 | 24 days supply | Qty: 6 | Fill #3

## 2015-09-23 MED FILL — METOCLOPRAMIDE 10 MG TABLET: 10 | 30 days supply | Qty: 90 | Fill #3

## 2015-09-23 MED FILL — !NOVOLOG FLEXPEN SYRINGE 1: 100/ML | 25 days supply | Qty: 3 | Fill #1

## 2015-10-15 ENCOUNTER — Inpatient Hospital Stay (HOSPITAL_COMMUNITY)
Admission: EM | Admit: 2015-10-15 | Discharge: 2015-10-17 | DRG: 074 | Disposition: A | Discharge status: Home / Self Care | Payer: Self-pay | Attending: Family Medicine | Admitting: Family Medicine

## 2015-10-15 ENCOUNTER — Encounter (HOSPITAL_COMMUNITY): Payer: Self-pay

## 2015-10-15 DIAGNOSIS — E1021 Type 1 diabetes mellitus with diabetic nephropathy: Secondary | ICD-10-CM | POA: Diagnosis present

## 2015-10-15 DIAGNOSIS — K219 Gastro-esophageal reflux disease without esophagitis: Secondary | ICD-10-CM | POA: Diagnosis present

## 2015-10-15 DIAGNOSIS — E669 Obesity, unspecified: Secondary | ICD-10-CM | POA: Diagnosis present

## 2015-10-15 DIAGNOSIS — Z79899 Other long term (current) drug therapy: Secondary | ICD-10-CM

## 2015-10-15 DIAGNOSIS — F329 Major depressive disorder, single episode, unspecified: Secondary | ICD-10-CM | POA: Diagnosis present

## 2015-10-15 DIAGNOSIS — Z6837 Body mass index (BMI) 37.0-37.9, adult: Secondary | ICD-10-CM

## 2015-10-15 DIAGNOSIS — K3184 Gastroparesis: Secondary | ICD-10-CM | POA: Diagnosis present

## 2015-10-15 DIAGNOSIS — E109 Type 1 diabetes mellitus without complications: Secondary | ICD-10-CM | POA: Diagnosis present

## 2015-10-15 DIAGNOSIS — E1143 Type 2 diabetes mellitus with diabetic autonomic (poly)neuropathy: Secondary | ICD-10-CM | POA: Diagnosis present

## 2015-10-15 DIAGNOSIS — E1065 Type 1 diabetes mellitus with hyperglycemia: Secondary | ICD-10-CM | POA: Diagnosis present

## 2015-10-15 DIAGNOSIS — I10 Essential (primary) hypertension: Secondary | ICD-10-CM | POA: Diagnosis present

## 2015-10-15 DIAGNOSIS — Z9049 Acquired absence of other specified parts of digestive tract: Secondary | ICD-10-CM

## 2015-10-15 DIAGNOSIS — J302 Other seasonal allergic rhinitis: Secondary | ICD-10-CM | POA: Diagnosis present

## 2015-10-15 DIAGNOSIS — Z794 Long term (current) use of insulin: Secondary | ICD-10-CM

## 2015-10-15 DIAGNOSIS — R1110 Vomiting, unspecified: Secondary | ICD-10-CM

## 2015-10-15 DIAGNOSIS — E1043 Type 1 diabetes mellitus with diabetic autonomic (poly)neuropathy: Principal | ICD-10-CM | POA: Diagnosis present

## 2015-10-15 LAB — CBC
HCT: 45.8 % (ref 39.0–52.0)
HEMOGLOBIN: 15 g/dL (ref 13.0–17.0)
MCH: 26.9 pg (ref 26.0–34.0)
MCHC: 32.8 g/dL (ref 30.0–36.0)
MCV: 82.1 fL (ref 78.0–100.0)
Platelets: 278 10*3/uL (ref 150–400)
RBC: 5.58 MIL/uL (ref 4.22–5.81)
RDW: 12.7 % (ref 11.5–15.5)
WBC: 9.5 10*3/uL (ref 4.0–10.5)

## 2015-10-15 LAB — COMPREHENSIVE METABOLIC PANEL
ALK PHOS: 93 U/L (ref 38–126)
ALT: 23 U/L (ref 17–63)
ANION GAP: 10 (ref 5–15)
AST: 23 U/L (ref 15–41)
Albumin: 4.2 g/dL (ref 3.5–5.0)
BILIRUBIN TOTAL: 0.8 mg/dL (ref 0.3–1.2)
BUN: 14 mg/dL (ref 6–20)
CALCIUM: 9.3 mg/dL (ref 8.9–10.3)
CO2: 22 mmol/L (ref 22–32)
CREATININE: 1.43 mg/dL — AB (ref 0.61–1.24)
Chloride: 104 mmol/L (ref 101–111)
GFR calc non Af Amer: >60 mL/min (ref >60)
Glucose, Bld: 249 mg/dL — ABNORMAL HIGH (ref 65–99)
Potassium: 4.1 mmol/L (ref 3.5–5.1)
Sodium: 136 mmol/L (ref 135–145)
TOTAL PROTEIN: 7.7 g/dL (ref 6.5–8.1)

## 2015-10-15 LAB — LIPASE, BLOOD: Lipase: 25 U/L (ref 11–51)

## 2015-10-15 MED ORDER — HALOPERIDOL LACTATE 5 MG/ML IJ SOLN
2.0000 mg | Freq: Once | INTRAMUSCULAR | Status: DC
  Filled 2015-10-15: qty 1

## 2015-10-15 MED ORDER — PROMETHAZINE HCL 25 MG/ML IJ SOLN: 25.0000 mg | Freq: Once | INTRAMUSCULAR | Status: DC

## 2015-10-15 MED ORDER — SODIUM CHLORIDE 0.9 % IV BOLUS (SEPSIS): 1000.0000 mL | Freq: Once | INTRAVENOUS | Status: DC

## 2015-10-15 MED ORDER — HYDROMORPHONE HCL 1 MG/ML IJ SOLN
1.0000 mg | Freq: Once | INTRAMUSCULAR | Status: AC
  Administered 2015-10-15: 1 mg via INTRAMUSCULAR
  Filled 2015-10-15: qty 1

## 2015-10-15 MED ORDER — HALOPERIDOL LACTATE 5 MG/ML IJ SOLN
2.0000 mg | Freq: Once | INTRAMUSCULAR | Status: AC
  Administered 2015-10-15: 2 mg via INTRAMUSCULAR

## 2015-10-15 MED ORDER — PROMETHAZINE HCL 25 MG/ML IJ SOLN
25.0000 mg | Freq: Once | INTRAMUSCULAR | Status: AC
  Administered 2015-10-16: 25 mg via INTRAMUSCULAR
  Filled 2015-10-15: qty 1

## 2015-10-15 MED ORDER — HYDROMORPHONE HCL 1 MG/ML IJ SOLN
1.0000 mg | Freq: Once | INTRAMUSCULAR | Status: AC
  Administered 2015-10-16: 1 mg via INTRAMUSCULAR
  Filled 2015-10-15: qty 1

## 2015-10-15 NOTE — ED Triage Notes (Addendum)
Patient here with nausea, vomiting and abdominal pain that started a few hours pta. Patient states that this is related to his gastroparesis. Denies diarrhea. Actively vomiting on arrival, alert and oriented. Patient diaphoretic

## 2015-10-15 NOTE — ED Notes (Signed)
IV attempted x 1 without success. Spoke to MD about placing EJ

## 2015-10-15 NOTE — ED Provider Notes (Signed)
Emergency Department Provider Note   I have reviewed the triage vital signs and the nursing notes.   HISTORY  Chief Complaint Abdominal Pain and Emesis   HPI Duane [name] is a 40 y.o. male with PMH of IDDM, gastritis, HTN, and gastroparesis presents to the emergency department for evaluation of sudden onset nausea and epigastric abdominal pain consistent with prior episodes of gastroparesis. The patient took oral Phenergan at home without relief in symptoms. He states the pain is very typical of his gastroparesis and does not feel new or different in any way. He denies chest pain or difficulty breathing. He reports being compliant with his home insulin. Fevers, chills, or productive cough. He reports typically symptoms respond to IV Phenergan and some other pain medication like morphine.    Past Medical History:  Diagnosis Date  . Depression   . Diabetes mellitus without complication [facility]) age 30   insulin requiring from the start  . Diabetic neuropathy ([place]) Dx 20110  . Gastritis   . Gastroparesis 2012   100% gastric retention on 08/2013 [facility]  . GERD (gastroesophageal reflux disease) Dx 2012  . Hypertension Dx 2012  . Obesity     Patient Active Problem List   Diagnosis Date Noted  . Status post cholecystectomy 10/16/2015  . Onychomycosis of toenail 04/22/2015  . Erectile dysfunction 04/22/2015  . Diabetic gastroparesis associated with type 1 diabetes mellitus ([place]) 01/01/2015  . Skin tag 09/17/2014  . Pain and swelling of left knee 09/13/2014  . Chronic diarrhea 09/13/2014  . Benign paroxysmal positional vertigo 07/15/2014  . MDD (major depressive disorder), single episode, severe , no psychosis ([place]) 05/19/2014  . GERD (gastroesophageal reflux disease) 05/05/2014  . Diabetic neuropathy ([place]) 08/28/2013  . Diabetic gastroparesis ([place]) 08/27/2013  . DM type 1 (diabetes mellitus, type 1) ([place]) 01/09/2013  . HTN (hypertension) 01/09/2013    Past Surgical History:    Procedure Laterality Date  . CHOLECYSTECTOMY  2013  . ESOPHAGOGASTRODUODENOSCOPY N/A 06/11/2013   Procedure: ESOPHAGOGASTRODUODENOSCOPY (EGD);  Surgeon: [name], MD;  Location: [facility];  Service: Endoscopy;  Laterality: N/A;  . FLEXIBLE SIGMOIDOSCOPY N/A 06/11/2013   Procedure: FLEXIBLE SIGMOIDOSCOPY;  Surgeon: [name], MD;  Location: [facility] ENDOSCOPY;  Service: Endoscopy;  Laterality: N/A;  . KNEE SURGERY Left       Allergies Review of patient's allergies indicates no known allergies.  Family History  Problem Relation Age of Onset  . Diabetes Father   . Diabetes Maternal Grandmother   . Diabetes Maternal Grandfather     Social History Social History  Substance Use Topics  . Smoking status: Never Smoker  . Smokeless tobacco: Never Used  . Alcohol use No    Review of Systems  Constitutional: No fever/chills Eyes: No visual changes. ENT: No sore throat. Cardiovascular: Denies chest pain. Respiratory: Denies shortness of breath. Gastrointestinal: Positive epigastric abdominal pain.  No nausea, no vomiting.  No diarrhea.  No constipation. Genitourinary: Negative for dysuria. Musculoskeletal: Negative for back pain. Skin: Negative for rash. Neurological: Negative for headaches, focal weakness or numbness.  10-point ROS otherwise negative.  ____________________________________________   PHYSICAL EXAM:  VITAL SIGNS: ED Triage Vitals  Enc Vitals Group     BP 10/15/15 1852 (!) 164/103     Pulse Rate 10/15/15 1852 97     Resp 10/15/15 1852 20     Temp 10/15/15 1852 98.5 F (36.9 C)     Temp Source 10/15/15 1852 Oral     SpO2 10/15/15 1852  99 %     Pain Score 10/15/15 1849 10   Constitutional: Alert and oriented. Dry heaves during interview.  Eyes: Conjunctivae are normal.  Head: Atraumatic. Nose: No congestion/rhinnorhea. Mouth/Throat: Mucous membranes are moist.  Oropharynx non-erythematous. Neck: No stridor.   Cardiovascular: Normal rate,  regular rhythm. Good peripheral circulation. Grossly normal heart sounds.   Respiratory: Normal respiratory effort.  No retractions. Lungs CTAB. Gastrointestinal: Soft with moderate epigastric tenderness. No distention.  Musculoskeletal: No lower extremity tenderness nor edema. No gross deformities of extremities. Neurologic:  Normal speech and language. No gross focal neurologic deficits are appreciated.  Skin:  Skin is warm, dry and intact. No rash noted. Psychiatric: Mood and affect are normal. Speech and behavior are normal.  ____________________________________________   LABS (all labs ordered are listed, but only abnormal results are displayed)  Labs Reviewed  COMPREHENSIVE METABOLIC PANEL - Abnormal; Notable for the following:       Result Value   Glucose, Bld 249 (*)    Creatinine, Ser 1.43 (*)    All other components within normal limits  URINALYSIS, ROUTINE W REFLEX MICROSCOPIC (NOT AT [facility]) - Abnormal; Notable for the following:    Specific Gravity, Urine 1.031 (*)    Glucose, UA >1000 (*)    Hgb urine dipstick SMALL (*)    Ketones, ur 15 (*)    Protein, ur 100 (*)    All other components within normal limits  BASIC METABOLIC PANEL - Abnormal; Notable for the following:    Glucose, Bld 256 (*)    Creatinine, Ser 1.38 (*)    All other components within normal limits  URINE MICROSCOPIC-ADD ON - Abnormal; Notable for the following:    Squamous Epithelial / LPF 0-5 (*)    Bacteria, UA RARE (*)    All other components within normal limits  GLUCOSE, CAPILLARY - Abnormal; Notable for the following:    Glucose-Capillary 215 (*)    All other components within normal limits  GLUCOSE, CAPILLARY - Abnormal; Notable for the following:    Glucose-Capillary 198 (*)    All other components within normal limits  LIPASE, BLOOD  CBC  URINE RAPID DRUG SCREEN, HOSP PERFORMED  CBC  HEMOGLOBIN A1C    ____________________________________________  EKG  None ____________________________________________  RADIOLOGY  None ____________________________________________   PROCEDURES  Procedure(s) performed:   Procedures  US-guided IV placement ____________________________________________   INITIAL IMPRESSION / ASSESSMENT AND PLAN / ED COURSE  Pertinent labs & imaging results that were available during my care of the patient were reviewed by me and considered in my medical decision making (see chart for details).  Patient resents to the emergency department for evaluation of gastroparesis pain and vomiting. Unable to keep down his Phenergan at home. Patient with mild epigastric discomfort but no acute findings on abdominal exam. Plan for labs given his history of diabetes to rule out DKA. Will give IV fluids and low-dose Haldol for symptom management.   09:14 PM Patient with continued vomiting and difficulty with IV access. Will give IM medication. No evidence of DKA or AKI on labs.   11:42 PM IV line established. Spoke with Dr. Tamala Julian who will see and place orders. Patient looking slightly more comfortable.   12:40 AM Placed US guided EJ with considerable difficulty. Flushing easily with no evidence of infiltration. Medicine team to see and place orders. Updated patient and answered all questions.  ____________________________________________  FINAL CLINICAL IMPRESSION(S) / ED DIAGNOSES  Final diagnoses:  Intractable vomiting with nausea, vomiting  of unspecified type     MEDICATIONS GIVEN DURING THIS VISIT:  Medications  enoxaparin (LOVENOX) injection 40 mg (40 mg Subcutaneous Given 10/16/15 1024)  0.9 %  sodium chloride infusion ( Intravenous Transfusing/Transfer 10/16/15 0216)  metoCLOPramide (REGLAN) injection 10 mg (10 mg Intravenous Given 10/16/15 0525)  promethazine (PHENERGAN) injection 25 mg (not administered)  prochlorperazine (COMPAZINE) injection 10 mg (10 mg  Intravenous Given 10/16/15 0302)  insulin aspart (novoLOG) injection 0-9 Units (2 Units Subcutaneous Given 10/16/15 0826)  insulin glargine (LANTUS) injection 5 Units (5 Units Subcutaneous Given 10/16/15 0307)  insulin aspart (novoLOG) injection 0-5 Units (0 Units Subcutaneous Not Given 10/16/15 0310)  famotidine (PEPCID) IVPB 20 mg premix (20 mg Intravenous Given 10/16/15 1027)  hydrALAZINE (APRESOLINE) injection 10 mg (not administered)  gabapentin (NEURONTIN) capsule 300 mg (not administered)  metoprolol (LOPRESSOR) tablet 50 mg (not administered)  promethazine (PHENERGAN) tablet 25 mg (not administered)  sucralfate (CARAFATE) tablet 1 g (not administered)  haloperidol lactate (HALDOL) injection 2 mg (2 mg Intramuscular Given 10/15/15 2121)  HYDROmorphone (DILAUDID) injection 1 mg (1 mg Intramuscular Given 10/15/15 2209)  HYDROmorphone (DILAUDID) injection 1 mg (1 mg Intramuscular Given 10/16/15 0013)  promethazine (PHENERGAN) injection 25 mg (25 mg Intramuscular Given 10/16/15 0013)  LORazepam (ATIVAN) injection 1 mg (1 mg Intravenous Given 10/16/15 0353)     NEW OUTPATIENT MEDICATIONS STARTED DURING THIS VISIT:  None   Note:  This document was prepared using Dragon voice recognition software and may include unintentional dictation errors.  Nanda Quinton, MD Emergency Medicine   Margette Fast, MD 10/16/15 [phone]

## 2015-10-16 ENCOUNTER — Encounter (HOSPITAL_COMMUNITY): Payer: Self-pay | Admitting: *Deleted

## 2015-10-16 DIAGNOSIS — R1110 Vomiting, unspecified: Secondary | ICD-10-CM

## 2015-10-16 DIAGNOSIS — K219 Gastro-esophageal reflux disease without esophagitis: Secondary | ICD-10-CM

## 2015-10-16 DIAGNOSIS — Z9049 Acquired absence of other specified parts of digestive tract: Secondary | ICD-10-CM

## 2015-10-16 DIAGNOSIS — I10 Essential (primary) hypertension: Secondary | ICD-10-CM

## 2015-10-16 DIAGNOSIS — K3184 Gastroparesis: Secondary | ICD-10-CM

## 2015-10-16 DIAGNOSIS — E1143 Type 2 diabetes mellitus with diabetic autonomic (poly)neuropathy: Secondary | ICD-10-CM

## 2015-10-16 DIAGNOSIS — E1042 Type 1 diabetes mellitus with diabetic polyneuropathy: Secondary | ICD-10-CM

## 2015-10-16 LAB — BASIC METABOLIC PANEL
Anion gap: 9 (ref 5–15)
BUN: 17 mg/dL (ref 6–20)
CALCIUM: 8.9 mg/dL (ref 8.9–10.3)
CO2: 24 mmol/L (ref 22–32)
CREATININE: 1.38 mg/dL — AB (ref 0.61–1.24)
Chloride: 105 mmol/L (ref 101–111)
GFR calc Af Amer: >60 mL/min (ref >60)
GFR calc non Af Amer: >60 mL/min (ref >60)
GLUCOSE: 256 mg/dL — AB (ref 65–99)
Potassium: 4.4 mmol/L (ref 3.5–5.1)
Sodium: 138 mmol/L (ref 135–145)

## 2015-10-16 LAB — URINALYSIS, ROUTINE W REFLEX MICROSCOPIC
Bilirubin Urine: NEGATIVE
Glucose, UA: >1000 mg/dL — AB
Ketones, ur: 15 mg/dL — AB
LEUKOCYTES UA: NEGATIVE
NITRITE: NEGATIVE
Protein, ur: 100 mg/dL — AB
SPECIFIC GRAVITY, URINE: 1.031 — AB (ref 1.005–1.030)
pH: 6 (ref 5.0–8.0)

## 2015-10-16 LAB — CBC
HCT: 42.5 % (ref 39.0–52.0)
Hemoglobin: 13.6 g/dL (ref 13.0–17.0)
MCH: 26.4 pg (ref 26.0–34.0)
MCHC: 32 g/dL (ref 30.0–36.0)
MCV: 82.4 fL (ref 78.0–100.0)
PLATELETS: 255 10*3/uL (ref 150–400)
RBC: 5.16 MIL/uL (ref 4.22–5.81)
RDW: 12.7 % (ref 11.5–15.5)
WBC: 8.6 10*3/uL (ref 4.0–10.5)

## 2015-10-16 LAB — GLUCOSE, CAPILLARY
GLUCOSE-CAPILLARY: 198 mg/dL — AB (ref 65–99)
GLUCOSE-CAPILLARY: 216 mg/dL — AB (ref 65–99)
GLUCOSE-CAPILLARY: 179 mg/dL — AB (ref 65–99)
Glucose-Capillary: 215 mg/dL — ABNORMAL HIGH (ref 65–99)
Glucose-Capillary: 211 mg/dL — ABNORMAL HIGH (ref 65–99)

## 2015-10-16 LAB — URINE MICROSCOPIC-ADD ON

## 2015-10-16 LAB — HEMOGLOBIN A1C
HEMOGLOBIN A1C: 11.2 % — AB (ref 4.8–5.6)
MEAN PLASMA GLUCOSE: 275 mg/dL

## 2015-10-16 LAB — RAPID URINE DRUG SCREEN, HOSP PERFORMED
AMPHETAMINES: NOT DETECTED
BENZODIAZEPINES: NOT DETECTED
Barbiturates: NOT DETECTED
COCAINE: NOT DETECTED
OPIATES: NOT DETECTED
TETRAHYDROCANNABINOL: NOT DETECTED

## 2015-10-16 MED ORDER — INSULIN ASPART 100 UNIT/ML ~~LOC~~ SOLN
0.0000 [IU] | Freq: Three times a day (TID) | SUBCUTANEOUS | Status: DC
  Administered 2015-10-16 (×2): 2 [IU] via SUBCUTANEOUS
  Administered 2015-10-16 – 2015-10-17 (×3): 3 [IU] via SUBCUTANEOUS

## 2015-10-16 MED ORDER — METOCLOPRAMIDE HCL 5 MG/ML IJ SOLN
10.0000 mg | Freq: Four times a day (QID) | INTRAMUSCULAR | Status: DC
  Administered 2015-10-16 – 2015-10-17 (×5): 10 mg via INTRAVENOUS
  Filled 2015-10-16 (×4): qty 2

## 2015-10-16 MED ORDER — ENOXAPARIN SODIUM 40 MG/0.4ML ~~LOC~~ SOLN
40.0000 mg | Freq: Every day | SUBCUTANEOUS | Status: DC
  Administered 2015-10-16: 40 mg via SUBCUTANEOUS
  Filled 2015-10-16: qty 0.4

## 2015-10-16 MED ORDER — PROMETHAZINE HCL 25 MG/ML IJ SOLN: 25.0000 mg | Freq: Four times a day (QID) | INTRAMUSCULAR | Status: DC | PRN

## 2015-10-16 MED ORDER — SUCRALFATE 1 G PO TABS
1.0000 g | ORAL_TABLET | Freq: Three times a day (TID) | ORAL | Status: DC
  Administered 2015-10-16 – 2015-10-17 (×5): 1 g via ORAL
  Filled 2015-10-16 (×5): qty 1

## 2015-10-16 MED ORDER — INSULIN ASPART 100 UNIT/ML ~~LOC~~ SOLN: 0.0000 [IU] | Freq: Every day | SUBCUTANEOUS | Status: DC

## 2015-10-16 MED ORDER — PROCHLORPERAZINE EDISYLATE 5 MG/ML IJ SOLN
10.0000 mg | INTRAMUSCULAR | Status: DC | PRN
  Administered 2015-10-16: 10 mg via INTRAVENOUS
  Filled 2015-10-16: qty 2

## 2015-10-16 MED ORDER — METOPROLOL TARTRATE 50 MG PO TABS
50.0000 mg | ORAL_TABLET | Freq: Two times a day (BID) | ORAL | Status: DC
  Administered 2015-10-16 – 2015-10-17 (×3): 50 mg via ORAL
  Filled 2015-10-16 (×3): qty 1

## 2015-10-16 MED ORDER — GABAPENTIN 300 MG PO CAPS
300.0000 mg | ORAL_CAPSULE | Freq: Every day | ORAL | Status: DC
  Administered 2015-10-16: 300 mg via ORAL
  Filled 2015-10-16: qty 1

## 2015-10-16 MED ORDER — SODIUM CHLORIDE 0.9 % IV SOLN
INTRAVENOUS | Status: DC
  Administered 2015-10-16 (×2): via INTRAVENOUS

## 2015-10-16 MED ORDER — LORAZEPAM 2 MG/ML IJ SOLN
1.0000 mg | Freq: Once | INTRAMUSCULAR | Status: AC
  Administered 2015-10-16: 1 mg via INTRAVENOUS
  Filled 2015-10-16: qty 1

## 2015-10-16 MED ORDER — HYDRALAZINE HCL 20 MG/ML IJ SOLN: 10.0000 mg | INTRAMUSCULAR | Status: DC | PRN

## 2015-10-16 MED ORDER — FAMOTIDINE IN NACL 20-0.9 MG/50ML-% IV SOLN
20.0000 mg | Freq: Two times a day (BID) | INTRAVENOUS | Status: DC
  Administered 2015-10-16 (×2): 20 mg via INTRAVENOUS
  Filled 2015-10-16 (×3): qty 50

## 2015-10-16 MED ORDER — PROMETHAZINE HCL 25 MG PO TABS: 25.0000 mg | ORAL_TABLET | Freq: Three times a day (TID) | ORAL | Status: DC | PRN

## 2015-10-16 MED ORDER — INSULIN GLARGINE 100 UNIT/ML ~~LOC~~ SOLN
5.0000 [IU] | Freq: Every day | SUBCUTANEOUS | Status: DC
  Administered 2015-10-16 (×2): 5 [IU] via SUBCUTANEOUS
  Filled 2015-10-16 (×3): qty 0.05

## 2015-10-16 NOTE — H&P (Signed)
History and Physical    Duane [name] IOE:[id number] DOB: Dec 12, 1975 DOA: 10/15/2015  Referring MD/NP/PA: Dr. Laverta Baltimore PCP: Duane Ends, MD  Patient coming from: Home  Chief Complaint: Nausea and vomiting  HPI: Duane [name] is a 40 y.o. male with medical history significant of diabetes mellitus type 1 with complications of gastroparesis, and HTN; who presents with complaints of nausea and vomiting which started yesterday. Patient notes that his current symptoms are typical with his gastroparesis flares. Unable to keep any food or liquids down. Complains of feeling fatigued, seasonal allergies, intermittent urinary frequency, neuropathy, and loose stools. Patient states that he has not been very compliant with adhering to a diabetic diet and insulin regimen.  His last hemoglobin A1c was approximately 9. Denies having any chest pain, shortness of breath, hematemesis, fever, chills, rash or dysuria. During previous hospitalizations Phenergan IV and pain medications help relieve symptoms.  ED Course: While in the ED patient was seen to be afebrile with blood pressure up to 164/103, and all other vital signs within normal limits. Blood work was unremarkable except for blood glucose 249 and  anion gap 10. He was given 25 mg of Phenergan, 2 mg of Haldol, and 1 mg of Dilaudid without relief of symptoms. IV access was difficult to establish despite multiple attempts from nursing and IV team. ED physician ended up placing a EJ.  [facility] called to admit.  Review of Systems: As per HPI otherwise 10 point review of systems negative.   Past Medical History:  Diagnosis Date  . Depression   . Diabetes mellitus without complication [facility]) age 89   insulin requiring from the start  . Diabetic neuropathy ([place]) Dx 201[address]  . Gastritis   . Gastroparesis 2012   100% gastric retention on 08/2013 [facility]  . GERD (gastroesophageal reflux disease) Dx 2012  . Hypertension Dx 2012  . Obesity     Past Surgical History:    Procedure Laterality Date  . CHOLECYSTECTOMY  2013  . ESOPHAGOGASTRODUODENOSCOPY N/A 06/11/2013   Procedure: ESOPHAGOGASTRODUODENOSCOPY (EGD);  Surgeon: Duane Mayer, MD;  Location: [facility];  Service: Endoscopy;  Laterality: N/A;  . FLEXIBLE SIGMOIDOSCOPY N/A 06/11/2013   Procedure: FLEXIBLE SIGMOIDOSCOPY;  Surgeon: Duane Mayer, MD;  Location: [facility] END[facility]COPY;  Service: Endoscopy;  Laterality: N/A;  . KNEE SURGERY Left      reports that he has never smoked. He has never used smokeless tobacco. He reports that he does not drink alcohol or use drugs.  No Known Allergies  Family History  Problem Relation Age of Onset  . Diabetes Father   . Diabetes Maternal Grandmother   . Diabetes Maternal Grandfather     Prior to Admission medications   Medication Sig Start Date End Date Taking? Authorizing Provider  Blood Glucose Monitoring Suppl (TRUE METRIX METER) W/DEVICE KIT 1 each by Does not apply route 4 (four) times daily -  before meals and at bedtime. 11/05/14  Yes Josalyn Funches, MD  diphenoxylate-atropine (LOMOTIL) 2.5-0.025 MG tablet Take 1 tablet by mouth 4 (four) times daily as needed for diarrhea or loose stools. 04/22/15  Yes Josalyn Funches, MD  gabapentin (NEURONTIN) 300 MG capsule TAKE 1 CAPSULE BY MOUTH AT BEDTIME. 07/29/15  Yes Josalyn Funches, MD  glucose blood (TRUE METRIX BLOOD GLUCOSE TEST) test strip Use as instructed 11/04/14  Yes Josalyn Funches, MD  insulin aspart (NOVOLOG) 100 UNIT/ML FlexPen INJECT 4 UNITS INTO THE SKIN 3 (THREE) TIMES DAILY WITH MEALS. (AS PER SLIDING SCALE) 08/19/15  Yes Duane  Amao, MD  insulin glargine (LANTUS) 100 UNIT/ML injection INJECT 25 UNITS INTO THE SKIN AT BEDTIME 06/08/15  Yes Josalyn Funches, MD  Insulin Pen Needle 31G X 8 MM MISC Inject 1 each into the skin 3 (three) times daily. Check blood sugar TID and QHS 07/26/14  Yes Duane Amao, MD  metoCLOPramide (REGLAN) 10 MG tablet TAKE 1 TABLET BY MOUTH 3 TIMES DAILY BEFORE MEALS FOR 1 WEEK  THEN AS NEEDED Patient taking differently: TAKE 1 TABLET BY MOUTH 3 TIMES DAILY BEFORE MEALS FOR 1 WEEK THEN AS NEEDED FOR NAUSEA 05/24/15  Yes Josalyn Funches, MD  metoprolol (LOPRESSOR) 50 MG tablet TAKE 1 TABLET BY MOUTH 2 TIMES DAILY. 07/26/15  Yes Josalyn Funches, MD  pantoprazole (PROTONIX) 40 MG tablet TAKE 1 TABLET BY MOUTH 2 TIMES DAILY 08/18/15  Yes Duane Nearing, MD  promethazine (PHENERGAN) 25 MG tablet Take 1 tablet (25 mg total) by mouth every 8 (eight) hours as needed for nausea or vomiting. 01/05/15  Yes Shanker Kristeen Mans, MD  sildenafil (VIAGRA) 100 MG tablet Take 0.5-1 tablets (50-100 mg total) by mouth daily as needed for erectile dysfunction. 04/22/15  Yes Josalyn Funches, MD  sucralfate (CARAFATE) 1 g tablet TAKE 1 TABLET BY MOUTH 3 TIMES DAILY WITH MEALS. 06/16/15  Yes Josalyn Funches, MD  traMADol (ULTRAM) 50 MG tablet Take 1 tablet (50 mg total) by mouth every 6 (six) hours as needed for moderate pain or severe pain. 04/22/15  Yes Josalyn Funches, MD  TRUEPLUS LANCETS 28G MISC 1 each by Does not apply route 4 (four) times daily -  before meals and at bedtime. 11/04/14  Yes Duane Nearing, MD    Physical Exam:   Constitutional: Sick appearing Overweight male vomiting at the the side of bed Vitals:   10/15/15 1852  BP: (!) 164/103  Pulse: 97  Resp: 20  Temp: 98.5 F (36.9 C)  TempSrc: Oral  SpO2: 99%   Eyes: PERRL, lids and conjunctivae normal ENMT: Mucous membranes are moist. Posterior pharynx clear of any exudate or lesions. Normal dentition.  Neck: normal, supple, no masses, no thyromegaly Respiratory: clear to auscultation bilaterally, no wheezing, no crackles. Normal respiratory effort. No accessory muscle use.  Cardiovascular: Regular rate and rhythm, no murmurs / rubs / gallops. No extremity edema. 2+ pedal pulses. No carotid bruits.  Abdomen: no tenderness, no masses palpated. No hepatosplenomegaly. Bowel sounds positive.  Musculoskeletal: no clubbing /  cyanosis. No joint deformity upper and lower extremities. Good ROM, no contractures. Normal muscle tone.  Skin: no rashes, lesions, ulcers. No induration Neurologic: CN 2-12 grossly intact. Sensation abnormal DTR normal. Strength 5/5 in all 4.  Psychiatric: Normal judgment and insight. Alert and oriented x 3. Normal mood.     Labs on Admission: I have personally reviewed following labs and imaging studies  CBC:  Recent Labs Lab 10/15/15 1906  WBC 9.5  HGB 15.0  HCT 45.8  MCV 82.1  PLT 824   Basic Metabolic Panel:  Recent Labs Lab 10/15/15 1906  NA 136  K 4.1  CL 104  CO2 22  GLUCOSE 249*  BUN 14  CREATININE 1.43*  CALCIUM 9.3   GFR: CrCl cannot be calculated (Unknown ideal weight.). Liver Function Tests:  Recent Labs Lab 10/15/15 1906  AST 23  ALT 23  ALKPHOS 93  BILITOT 0.8  PROT 7.7  ALBUMIN 4.2    Recent Labs Lab 10/15/15 1906  LIPASE 25   No results for input(s): AMMONIA in the last 168 hours.  Coagulation Profile: No results for input(s): INR, PROTIME in the last 168 hours. Cardiac Enzymes: No results for input(s): CKTOTAL, CKMB, CKMBINDEX, TROPONINI in the last 168 hours. BNP (last 3 results) No results for input(s): PROBNP in the last 8760 hours. HbA1C: No results for input(s): HGBA1C in the last 72 hours. CBG: No results for input(s): GLUCAP in the last 168 hours. Lipid Profile: No results for input(s): CHOL, HDL, LDLCALC, TRIG, CHOLHDL, LDLDIRECT in the last 72 hours. Thyroid Function Tests: No results for input(s): TSH, T4TOTAL, FREET4, T3FREE, THYROIDAB in the last 72 hours. Anemia Panel: No results for input(s): VITAMINB12, FOLATE, FERRITIN, TIBC, IRON, RETICCTPCT in the last 72 hours. Urine analysis:    Component Value Date/Time   COLORURINE YELLOW 01/01/2015 0510   APPEARANCEUR CLEAR 01/01/2015 0510   LABSPEC 1.016 01/01/2015 0510   PHURINE 7.0 01/01/2015 0510   GLUCOSEU >1000 (A) 01/01/2015 0510   HGBUR MODERATE (A)  01/01/2015 0510   BILIRUBINUR NEGATIVE 01/01/2015 0510   KETONESUR NEGATIVE 01/01/2015 0510   PROTEINUR 30 (A) 01/01/2015 0510   UROBILINOGEN 0.2 06/27/2014 0748   NITRITE NEGATIVE 01/01/2015 0510   LEUKOCYTESUR NEGATIVE 01/01/2015 0510   Sepsis Labs: No results found for this or any previous visit (from the past 240 hour(s)).   Radiological Exams on Admission: No results found.  Assessment/Plan Diabetic gastroparesis with persistent nausea and vomiting: Acute on chronic. While in ED patient - Admit to a MedSurg bed - Reglan 10 mg IV q 6 hours - Phenergan/Compazine prn - IV fluids normal saline 75 mL per hour (rate decreased 2/2   - Advance diet as tolerated  Diabetes mellitus type 1: Uncontrolled. Patient last hemoglobin A1c was 9.4 in 04/22/15 - Hypoglycemic protocols - Lantus 5 units subcutaneous daily at bedtime - Sensitive sliding scale insulin daily at bedtime and before meals - Counseled the patient on the need of better compliance with diabetic regimen  Essential hypertension - Restart metoprolol when tolerating po - Hydralazine prn   GERD - IV Pepcid changed to Protonix po when able  S/p cholecystectomy with complaints of loose stools - Counseled patient on foods that put him at increased risk - Patient likely will benefit from dietary consultation/diabetic educator  Restart oral medications when tolerating po  DVT prophylaxis: Lovenox  Code Status: Full Family Communication: No family present at bedside Disposition Plan: Possible discharge home when tolerating po Consults called: none Admission status: Observation  Norval Morton MD [facility] Pager [phone]  If 7PM-7AM, please contact night-coverage [email or web address] Password [email or web address]  10/16/2015, 12:52 AM

## 2015-10-16 NOTE — Progress Notes (Signed)
Agree with HPI as per Dr. Tamala Julian   39 y/o ? ty 1 DM diagnosed age 47 complicated by Gastroparesis, neuropathy, nephropathy htn Underwent Gastric emptying study 08/2013 confirming diagnosis  Admitted with intractable n/v and likely flare of gastroparesis Agree with orders written  Will re-start oral meds ig able to tolerate lunch  Patient sleepy but able to tolerate clear this am No further specirfic large vomit + 1 stool No cp  Will reassess in am  [name], MD [facility] [phone]

## 2015-10-17 LAB — GLUCOSE, CAPILLARY
GLUCOSE-CAPILLARY: 226 mg/dL — AB (ref 65–99)
GLUCOSE-CAPILLARY: 219 mg/dL — AB (ref 65–99)
GLUCOSE-CAPILLARY: 222 mg/dL — AB (ref 65–99)

## 2015-10-17 MED ORDER — PROMETHAZINE HCL 25 MG PO TABS: 25.0000 mg | ORAL_TABLET | Freq: Three times a day (TID) | ORAL | 0 refills | Status: DC | PRN

## 2015-10-17 MED ORDER — INSULIN ASPART 100 UNIT/ML FLEXPEN: PEN_INJECTOR | SUBCUTANEOUS | 3 refills | Status: DC

## 2015-10-17 MED ORDER — METOCLOPRAMIDE HCL 10 MG PO TABS
10.0000 mg | ORAL_TABLET | Freq: Three times a day (TID) | ORAL | Status: DC
  Administered 2015-10-17: 10 mg via ORAL
  Filled 2015-10-17: qty 1

## 2015-10-17 MED ORDER — FAMOTIDINE 20 MG PO TABS: 20.0000 mg | ORAL_TABLET | Freq: Two times a day (BID) | ORAL | 0 refills | Status: DC

## 2015-10-17 MED ORDER — FAMOTIDINE 20 MG PO TABS
20.0000 mg | ORAL_TABLET | Freq: Two times a day (BID) | ORAL | Status: DC
  Administered 2015-10-17: 20 mg via ORAL
  Filled 2015-10-17: qty 1

## 2015-10-17 MED ORDER — INSULIN GLARGINE 100 UNIT/ML ~~LOC~~ SOLN: SUBCUTANEOUS | 3 refills | Status: DC

## 2015-10-17 NOTE — Discharge Summary (Signed)
Physician Discharge Summary  Duane [name] RJJ:[id number] DOB: [birth year] DOA: 10/15/2015  PCP: Minerva Ends, MD  Admit date: 10/15/2015 Discharge date: 10/17/2015  Time spent: 30 minutes  Recommendations for Outpatient Follow-up:  1. Patient should follow up with outpatient physician for screening for depression and assistance with insomnia 2. We have refilled regular insulin Lantus and Phenergan at patient requests this admission 3. Should follow up with an A1c as an outpatient 4. Recommend vocational rehabilitation if patient willing to perform  Discharge Diagnoses:  Principal Problem:   Diabetic gastroparesis ([place]) Active Problems:   DM type 1 (diabetes mellitus, type 1) ([place])   HTN (hypertension)   GERD (gastroesophageal reflux disease)   Status post cholecystectomy   Discharge Condition: improved  Diet recommendation: Diabetic hh  Filed Weights   10/17/15 0900  Weight: 136.1 kg (300 lb)    Final diagnoses diabetic gastroparesis secondary to diabetes mellitus  History of present illness:  40 y/o ? ty 1 DM diagnosed age 34 complicated by Gastroparesis, neuropathy, nephropathy htn Underwent Gastric emptying study 08/2013 confirming diagnosis  Admitted with intractable n/v and likely flare of gastroparesis  He was admitted overnight and subsequently was able to tolerate full diet without issue and it was felt that patient was eating large meals and he needed to be eating with his diagnosis of gastroparesis and he was counseled regarding the same He notes some elevated am CBG and I did increase his lantus to 30 u this admit On discharge he requested refills of some his medications which were provided for him and he was discharged after tolerating 3-4 meals without event   Discharge Exam: Vitals:   10/16/15 2119 10/17/15 0439  BP: (!) 114/92 125/77  Pulse: 85 70  Resp: 18 18  Temp: 98.1 F (36.7 C) 98.1 F (36.7 C)    General: alert,Flat affect oriented in  nad Cardiovascular: S1-S2 no murmur rub or gallop  Respiratochest is clinically clear no added sounds No rash no lower extremity weakness or sensory loss  Discharge Instructions   Discharge Instructions    Diet - low sodium heart healthy    Complete by:  As directed   Discharge instructions    Complete by:  As directed   please seek help as an outpatient for depressive symptoms and insomnia Continue medications without fail we have refill some of them for you. Please monitor blood sugars -notice that we have increased your dose of Lantus at home   Increase activity slowly    Complete by:  As directed     Current Discharge Medication List    START taking these medications   Details  famotidine (PEPCID) 20 MG tablet Take 1 tablet (20 mg total) by mouth 2 (two) times daily. Qty: 30 tablet, Refills: 0      CONTINUE these medications which have CHANGED   Details  insulin aspart (NOVOLOG) 100 UNIT/ML FlexPen INJECT 6 UNITS INTO THE SKIN 3 (THREE) TIMES DAILY WITH MEALS. (AS PER SLIDING SCALE) Qty: 15 mL, Refills: 3    insulin glargine (LANTUS) 100 UNIT/ML injection INJECT 30 UNITS INTO THE SKIN AT BEDTIME Qty: 30 mL, Refills: 3    promethazine (PHENERGAN) 25 MG tablet Take 1 tablet (25 mg total) by mouth every 8 (eight) hours as needed for nausea or vomiting. Qty: 60 tablet, Refills: 0      CONTINUE these medications which have NOT CHANGED   Details  Blood Glucose Monitoring Suppl (TRUE METRIX METER) W/DEVICE KIT 1 each by Does  not apply route 4 (four) times daily -  before meals and at bedtime. Qty: 1 kit, Refills: ea    gabapentin (NEURONTIN) 300 MG capsule TAKE 1 CAPSULE BY MOUTH AT BEDTIME. Qty: 30 capsule, Refills: 2    Insulin Pen Needle 31G X 8 MM MISC Inject 1 each into the skin 3 (three) times daily. Check blood sugar TID and QHS Qty: 100 each, Refills: 3    metoCLOPramide (REGLAN) 10 MG tablet TAKE 1 TABLET BY MOUTH 3 TIMES DAILY BEFORE MEALS FOR 1 WEEK THEN AS  NEEDED Qty: 90 tablet, Refills: 3    metoprolol (LOPRESSOR) 50 MG tablet TAKE 1 TABLET BY MOUTH 2 TIMES DAILY. Qty: 60 tablet, Refills: 2    pantoprazole (PROTONIX) 40 MG tablet TAKE 1 TABLET BY MOUTH 2 TIMES DAILY Qty: 60 tablet, Refills: 2    sildenafil (VIAGRA) 100 MG tablet Take 0.5-1 tablets (50-100 mg total) by mouth daily as needed for erectile dysfunction. Qty: 5 tablet, Refills: 11   Associated Diagnoses: Erectile dysfunction, unspecified erectile dysfunction type    sucralfate (CARAFATE) 1 g tablet TAKE 1 TABLET BY MOUTH 3 TIMES DAILY WITH MEALS. Qty: 90 tablet, Refills: 3   Associated Diagnoses: Diabetic gastroparesis ([facility])    TRUEPLUS LANCETS 28G MISC 1 each by Does not apply route 4 (four) times daily -  before meals and at bedtime. Qty: 100 each, Refills: 12      STOP taking these medications     diphenoxylate-atropine (LOMOTIL) 2.5-0.025 MG tablet      glucose blood (TRUE METRIX BLOOD GLUCOSE TEST) test strip      traMADol (ULTRAM) 50 MG tablet        No Known Allergies    The results of significant diagnostics from this hospitalization (including imaging, microbiology, ancillary and laboratory) are listed below for reference.    Significant Diagnostic Studies: No results found.  Microbiology: No results found for this or any previous visit (from the past 240 hour(s)).   Labs: Basic Metabolic Panel:  Recent Labs Lab 10/15/15 1906 10/16/15 0359  NA 136 138  K 4.1 4.4  CL 104 105  CO2 22 24  GLUCOSE 249* 256*  BUN 14 17  CREATININE 1.43* 1.38*  CALCIUM 9.3 8.9   Liver Function Tests:  Recent Labs Lab 10/15/15 1906  AST 23  ALT 23  ALKPHOS 93  BILITOT 0.8  PROT 7.7  ALBUMIN 4.2    Recent Labs Lab 10/15/15 1906  LIPASE 25   No results for input(s): AMMONIA in the last 168 hours. CBC:  Recent Labs Lab 10/15/15 1906 10/16/15 0359  WBC 9.5 8.6  HGB 15.0 13.6  HCT 45.8 42.5  MCV 82.1 82.4  PLT 278 255   Cardiac  Enzymes: No results for input(s): CKTOTAL, CKMB, CKMBINDEX, TROPONINI in the last 168 hours. BNP: BNP (last 3 results) No results for input(s): BNP in the last 8760 hours.  ProBNP (last 3 results) No results for input(s): PROBNP in the last 8760 hours.  CBG:  Recent Labs Lab 10/16/15 0743 10/16/15 1139 10/16/15 1638 10/16/15 2117 10/17/15 0806  GLUCAP 198* 216* 179* 211* 219*       Signed:  [name] MD   [facility] 10/17/2015, 9:51 AM

## 2015-10-17 NOTE — Progress Notes (Signed)
Nutrition Brief Note  Patient identified on the Malnutrition Screening Tool (MST) Report. Patient with no significant weight loss. He reports some poor intake PTA, but that has improved with medications.   Wt Readings from Last 15 Encounters:  10/17/15 300 lb (136.1 kg)  04/22/15 (!) 308 lb (139.7 kg)  01/04/15 (!) 310 lb (140.6 kg)  12/30/14 (!) 310 lb (140.6 kg)  09/17/14 284 lb (128.8 kg)  09/13/14 283 lb (128.4 kg)  07/26/14 275 lb (124.7 kg)  07/07/14 269 lb (122 kg)  07/01/14 269 lb 12.8 oz (122.4 kg)  05/11/14 276 lb (125.2 kg)  05/07/14 278 lb (126.1 kg)  05/05/14 268 lb 4.8 oz (121.7 kg)  05/03/14 280 lb 2 oz (127.1 kg)  02/17/14 261 lb 14.4 oz (118.8 kg)  02/05/14 280 lb 6.8 oz (127.2 kg)    Body mass index is 37.5 kg/m. Patient meets criteria for class 2 obesity based on current BMI.   Current diet order is CHO modified, low sodium, patient is consuming approximately 100% of meals at this time. Labs and medications reviewed.   Plans for d/c home today. No nutrition interventions warranted at this time. If nutrition issues arise, please consult RD.   Molli Barrows, RD, LDN, [place] Pager [phone] After Hours Pager [phone]

## 2015-10-17 NOTE — Progress Notes (Signed)
Patient being discharged to home. All discharge instructions reviewed with patient. Prescriptions reviewed. IV removed. Patient dressed. All belongings with patient. Patient left the floor in wheelchair with family in stable condition.  Sheliah Plane RN

## 2015-10-18 MED FILL — FAMOTIDINE 20 MG TABLET: 20 | 15 days supply | Qty: 30 | Fill #0

## 2015-10-20 MED FILL — TRUE METRIX TEST STRIP: 24 days supply | Qty: 100 | Fill #10

## 2015-10-20 MED FILL — !NOVOLOG FLEXPEN SYRINGE 1: 100/ML | 25 days supply | Qty: 3 | Fill #2

## 2015-10-20 MED FILL — METOPROLOL TARTRATE 50 MG T: 50 | 30 days supply | Qty: 60 | Fill #2

## 2015-10-20 MED FILL — GABAPENTIN 300 MG CAPSULE: 300 | 30 days supply | Qty: 30 | Fill #2

## 2015-10-20 MED FILL — !LANTUS SOLOSTAR 100UNITS/M: 100 | 24 days supply | Qty: 6 | Fill #4

## 2015-11-10 ENCOUNTER — Other Ambulatory Visit: Payer: Self-pay | Admitting: Family Medicine

## 2015-11-10 MED FILL — PANTOPRAZOLE SOD DR 40 MG T: 40 | 30 days supply | Qty: 60 | Fill #2

## 2015-11-10 MED FILL — !NOVOLOG FLEXPEN SYRINGE 1: 100/ML | 25 days supply | Qty: 3 | Fill #3

## 2015-11-14 MED FILL — LANTUS SOLOSTAR 100 UNITS/M: 100 | 30 days supply | Qty: 9 | Fill #0

## 2015-11-14 MED FILL — METOCLOPRAMIDE 10 MG TABLET: 10 | 30 days supply | Qty: 90 | Fill #0

## 2015-11-22 ENCOUNTER — Other Ambulatory Visit: Payer: Self-pay | Admitting: Family Medicine

## 2015-11-23 MED FILL — GABAPENTIN 300 MG CAPSULE: 300 | 30 days supply | Qty: 30 | Fill #0

## 2015-11-30 MED FILL — !NOVOLOG FLEXPEN SYRINGE 1: 100/ML | 25 days supply | Qty: 3 | Fill #4

## 2015-12-06 ENCOUNTER — Telehealth: Payer: Self-pay | Admitting: Family Medicine

## 2015-12-06 NOTE — Telephone Encounter (Signed)
Patient called to speak with nurse regarding a bowel issues (continuos diarrhea) that he has been experiencing for the last weeks. Please follow up. Pt has an appt on 11/9 but can't that long. [phone].  Thank you.

## 2015-12-07 NOTE — Telephone Encounter (Signed)
Pt was called on 10/25 and there was no answer, and Vm pick up.

## 2015-12-13 ENCOUNTER — Other Ambulatory Visit: Payer: Self-pay | Admitting: Family Medicine

## 2015-12-19 MED FILL — LANTUS SOLOSTAR 100 UNITS/M: 100 | 30 days supply | Qty: 9 | Fill #1

## 2015-12-19 MED FILL — !NOVOLOG FLEXPEN SYRINGE 1: 100/ML | 25 days supply | Qty: 3 | Fill #5

## 2015-12-19 MED FILL — TRUE METRIX TEST STRIP: 30 days supply | Qty: 100 | Fill #0

## 2015-12-19 MED FILL — METOPROLOL TARTRATE 50 MG T: 50 | 30 days supply | Qty: 60 | Fill #3

## 2015-12-23 ENCOUNTER — Encounter: Payer: Self-pay | Admitting: Family Medicine

## 2015-12-23 ENCOUNTER — Ambulatory Visit: Payer: Self-pay | Attending: Family Medicine | Admitting: Family Medicine

## 2015-12-23 VITALS — BP 133/87 | HR 71 | Temp 98.0°F | Ht 75.0 in | Wt 314.8 lb

## 2015-12-23 DIAGNOSIS — F322 Major depressive disorder, single episode, severe without psychotic features: Secondary | ICD-10-CM | POA: Insufficient documentation

## 2015-12-23 DIAGNOSIS — E1043 Type 1 diabetes mellitus with diabetic autonomic (poly)neuropathy: Secondary | ICD-10-CM | POA: Insufficient documentation

## 2015-12-23 DIAGNOSIS — E1143 Type 2 diabetes mellitus with diabetic autonomic (poly)neuropathy: Secondary | ICD-10-CM

## 2015-12-23 DIAGNOSIS — K529 Noninfective gastroenteritis and colitis, unspecified: Secondary | ICD-10-CM | POA: Insufficient documentation

## 2015-12-23 DIAGNOSIS — Z79899 Other long term (current) drug therapy: Secondary | ICD-10-CM | POA: Insufficient documentation

## 2015-12-23 DIAGNOSIS — E1042 Type 1 diabetes mellitus with diabetic polyneuropathy: Secondary | ICD-10-CM | POA: Insufficient documentation

## 2015-12-23 DIAGNOSIS — N529 Male erectile dysfunction, unspecified: Secondary | ICD-10-CM | POA: Insufficient documentation

## 2015-12-23 DIAGNOSIS — B351 Tinea unguium: Secondary | ICD-10-CM | POA: Insufficient documentation

## 2015-12-23 DIAGNOSIS — K3184 Gastroparesis: Secondary | ICD-10-CM | POA: Insufficient documentation

## 2015-12-23 LAB — GLUCOSE, POCT (MANUAL RESULT ENTRY): POC Glucose: 223 mg/dl — AB (ref 70–99)

## 2015-12-23 MED ORDER — SUCRALFATE 1 G PO TABS: 1.0000 g | ORAL_TABLET | Freq: Three times a day (TID) | ORAL | 3 refills | Status: DC

## 2015-12-23 MED ORDER — INSULIN ASPART 100 UNIT/ML FLEXPEN: 10.0000 [IU] | PEN_INJECTOR | Freq: Three times a day (TID) | SUBCUTANEOUS | 3 refills | Status: DC

## 2015-12-23 MED ORDER — BUPROPION HCL ER (XL) 150 MG PO TB24: 150.0000 mg | ORAL_TABLET | Freq: Every day | ORAL | 3 refills | Status: DC

## 2015-12-23 MED ORDER — TADALAFIL 2.5 MG PO TABS: 2.5000 mg | ORAL_TABLET | Freq: Every day | ORAL | 5 refills | Status: DC

## 2015-12-23 MED ORDER — INSULIN GLARGINE 100 UNIT/ML SOLOSTAR PEN: 30.0000 [IU] | PEN_INJECTOR | Freq: Every day | SUBCUTANEOUS | 5 refills | Status: DC

## 2015-12-23 MED ORDER — FAMOTIDINE 20 MG PO TABS: 20.0000 mg | ORAL_TABLET | Freq: Two times a day (BID) | ORAL | 0 refills | Status: DC

## 2015-12-23 MED ORDER — CHOLESTYRAMINE 4 G PO PACK: 4.0000 g | PACK | Freq: Three times a day (TID) | ORAL | 12 refills | Status: DC

## 2015-12-23 MED ORDER — TERBINAFINE HCL 250 MG PO TABS: 250.0000 mg | ORAL_TABLET | Freq: Every day | ORAL | 2 refills | Status: DC

## 2015-12-23 MED ORDER — SUCRALFATE 1 GM/10ML PO SUSP: 1.0000 g | Freq: Three times a day (TID) | ORAL | 5 refills | Status: DC

## 2015-12-23 MED FILL — BUPROPION HCL XL 150 MG TAB: 150 | 30 days supply | Qty: 30 | Fill #0

## 2015-12-23 MED FILL — TERBINAFINE HCL 250 MG TAB: 250 | 30 days supply | Qty: 30 | Fill #0

## 2015-12-23 MED FILL — FAMOTIDINE 20 MG TABLET: 20 | 15 days supply | Qty: 30 | Fill #0

## 2015-12-23 MED FILL — CARAFATE 1 GM/10 ML SUSP: 1 | 30 days supply | Qty: 120 | Fill #0

## 2015-12-23 MED FILL — CHOLESTYRAMINE LIGHT POWDER: 4 | 30 days supply | Qty: 210 | Fill #0

## 2015-12-23 NOTE — Assessment & Plan Note (Signed)
lamisil ordered

## 2015-12-23 NOTE — Assessment & Plan Note (Signed)
A: major depression  P: Wellbutrin XL 150 mg ordered Patient provided with mental health resources

## 2015-12-23 NOTE — Assessment & Plan Note (Signed)
Chronic diarrhea Patient does not have gallbladder Questran ordered

## 2015-12-23 NOTE — Assessment & Plan Note (Addendum)
Uncontrolled diabetes type 1 Plan: Titrate up on lantus Continue novolog 10 U TID

## 2015-12-23 NOTE — Progress Notes (Signed)
Pt is here today to follow up on Diabetes, pt is also here to discuss his bowl problem, pt has been having diarrhea.  Pt states that he is depressed. Pt has not had carafate in a while due to back order in pharmacy.

## 2015-12-23 NOTE — Progress Notes (Signed)
Subjective:  Patient ID: Duane [name], male    DOB: Feb 17, 1975  Age: 40 y.o. MRN: [id number]  CC: Diabetes   HPI Duane [name] presents for    1. Diarrhea: x  80month. Usually in early mornings. Watery to soft bowel movements. Last 4-5 hrs up to all day. No identified food triggers. Sometimes cheeses or dairy items cause pain. Has hx of cholecystectomy. Diarrhea is associated with gas pain. Bloating. No fever. No blood in stool. Denies constipation. Has IDDM type 1 with gastroparesis. ? IBS. No family history of IBS/IBD or lactose intolerance. He has decreased gas producing foods and lactose from his diet. This has not helped him much. He has darks stools at times. No frank blood.    2. CHRONIC DIABETES  Disease Monitoring  Blood Sugar Ranges:   Fasting: 150-190  Post prandial: 390  Polyuria: no   Visual problems: yes   Medication Compliance: yes,taking lantus 30 U daily, novolog 9-10 U after meals  Medication Side Effects  Hypoglycemia: no   Preventitive Health Care  Eye Exam: due   Foot Exam: done today   Diet pattern: eats irregular meals, eating 1-2 meals a day.   Exercise: yes   3. Stress: stressed and trouble sleeping x one year. Feeling depressed. Does not sleep at night. No history of serious depression. Not feeling like himself. Under a lot of tension. He is depressed about erectile dysfunction. He is having trouble getting and keeping erection. He is also depressed. He is unemployed and on disability.   Social History  Substance Use Topics  . Smoking status: Never Smoker  . Smokeless tobacco: Never Used  . Alcohol use No    Outpatient Medications Prior to Visit  Medication Sig Dispense Refill  . Blood Glucose Monitoring Suppl (TRUE METRIX METER) W/DEVICE KIT 1 each by Does not apply route 4 (four) times daily -  before meals and at bedtime. 1 kit ea  . famotidine (PEPCID) 20 MG tablet Take 1 tablet (20 mg total) by mouth 2 (two) times daily. 30 tablet 0  .  gabapentin (NEURONTIN) 300 MG capsule TAKE 1 CAPSULE BY MOUTH AT BEDTIME. 30 capsule 0  . glucose blood test strip Use as instructed 100 each 12  . insulin aspart (NOVOLOG) 100 UNIT/ML FlexPen INJECT 6 UNITS INTO THE SKIN 3 (THREE) TIMES DAILY WITH MEALS. (AS PER SLIDING SCALE) 15 mL 3  . Insulin Glargine (LANTUS SOLOSTAR) 100 UNIT/ML Solostar Pen Inject 30 Units into the skin at bedtime. 30 mL 0  . Insulin Pen Needle 31G X 8 MM MISC Inject 1 each into the skin 3 (three) times daily. Check blood sugar TID and QHS 100 each 3  . metoCLOPramide (REGLAN) 10 MG tablet Take 1 tablet (10 mg total) by mouth 3 (three) times daily before meals. As needed 90 tablet 0  . metoprolol (LOPRESSOR) 50 MG tablet TAKE 1 TABLET BY MOUTH 2 TIMES DAILY. 60 tablet 2  . pantoprazole (PROTONIX) 40 MG tablet TAKE 1 TABLET BY MOUTH 2 TIMES DAILY 60 tablet 2  . promethazine (PHENERGAN) 25 MG tablet Take 1 tablet (25 mg total) by mouth every 8 (eight) hours as needed for nausea or vomiting. 60 tablet 0  . sildenafil (VIAGRA) 100 MG tablet Take 0.5-1 tablets (50-100 mg total) by mouth daily as needed for erectile dysfunction. 5 tablet 11  . TRUEPLUS LANCETS 28G MISC 1 each by Does not apply route 4 (four) times daily -  before meals and at bedtime. 100  each 12  . sucralfate (CARAFATE) 1 g tablet TAKE 1 TABLET BY MOUTH 3 TIMES DAILY WITH MEALS. (Patient not taking: Reported on 12/23/2015) 90 tablet 3   No facility-administered medications prior to visit.     ROS Review of Systems  Constitutional: Negative for chills, fatigue, fever and unexpected weight change.  Eyes: Positive for visual disturbance.  Respiratory: Negative for cough and shortness of breath.   Cardiovascular: Negative for chest pain, palpitations and leg swelling.  Gastrointestinal: Positive for abdominal pain and diarrhea. Negative for blood in stool, constipation, nausea and vomiting.  Endocrine: Negative for polydipsia, polyphagia and polyuria.    Musculoskeletal: Positive for arthralgias and joint swelling. Negative for back pain, gait problem, myalgias and neck pain.  Skin: Negative for rash.  Allergic/Immunologic: Negative for immunocompromised state.  Hematological: Negative for adenopathy. Does not bruise/bleed easily.  Psychiatric/Behavioral: Positive for dysphoric mood and sleep disturbance. Negative for suicidal ideas. The patient is nervous/anxious.     Objective:  BP 133/87 (BP Location: Right Arm, Patient Position: Sitting, Cuff Size: Large)   Pulse 71   Temp 98 F (36.7 C) (Oral)   Ht 6' 3"  (1.905 m)   Wt (!) 314 lb 12.8 oz (142.8 kg)   SpO2 100%   BMI 39.35 kg/m   BP/Weight 12/23/2015 10/17/2015 4/81/8563  Systolic BP 149 702 637  Diastolic BP 87 82 86  Wt. (Lbs) 314.8 300 308  BMI 39.35 37.5 38.5    Physical Exam  Constitutional: He appears well-developed and well-nourished. No distress.  HENT:  Head: Normocephalic and atraumatic.  Neck: Normal range of motion. Neck supple.  Cardiovascular: Normal rate, regular rhythm, normal heart sounds and intact distal pulses.   Pulmonary/Chest: Effort normal and breath sounds normal.  Musculoskeletal: He exhibits no edema.  Neurological: He is alert.  Skin: Skin is warm and dry. No rash noted. No erythema.     Psychiatric: He has a normal mood and affect.    Lab Results  Component Value Date   HGBA1C 11.2 (H) 10/16/2015   Lab Results  Component Value Date   HGBA1C 11.2 (H) 10/16/2015    CBG 172  Depression screen [facility] 2/9 12/23/2015 04/22/2015 12/30/2014  Decreased Interest 1 2 0  Down, Depressed, Hopeless 1 3 0  PHQ - 2 Score 2 5 0  Altered sleeping 3 3 -  Tired, decreased energy 2 2 -  Change in appetite 3 2 -  Feeling bad or failure about yourself  2 2 -  Trouble concentrating 1 2 -  Moving slowly or fidgety/restless 1 2 -  Suicidal thoughts 1 1 -  PHQ-9 Score 15 19 -   GAD 7 : Generalized Anxiety Score 12/23/2015  Nervous, Anxious, on Edge 1   Control/stop worrying 3  Worry too much - different things 3  Trouble relaxing 2  Restless 0  Easily annoyed or irritable 2  Afraid - awful might happen 1  Total GAD 7 Score 12      Assessment & Plan:   Duane [name] was seen today for diabetes.  Diagnoses and all orders for this visit:  Type 1 diabetes mellitus with diabetic polyneuropathy (HCC) -     POCT glucose (manual entry) -     Insulin Glargine (LANTUS SOLOSTAR) 100 UNIT/ML Solostar Pen; Inject 30-60 Units into the skin at bedtime. Titration as needed -     insulin aspart (NOVOLOG) 100 UNIT/ML FlexPen; Inject 10 Units into the skin 3 (three) times daily with meals. -  COMPLETE METABOLIC PANEL WITH GFR  Chronic diarrhea -     cholestyramine (QUESTRAN) 4 g packet; Take 1 packet (4 g total) by mouth 3 (three) times daily with meals.  Diabetic gastroparesis ([place]) -     Discontinue: sucralfate (CARAFATE) 1 g tablet; Take 1 tablet (1 g total) by mouth 4 (four) times daily -  with meals and at bedtime. -     sucralfate (CARAFATE) 1 GM/10ML suspension; Take 10 mLs (1 g total) by mouth 4 (four) times daily -  with meals and at bedtime. -     famotidine (PEPCID) 20 MG tablet; Take 1 tablet (20 mg total) by mouth 2 (two) times daily.  Erectile dysfunction, unspecified erectile dysfunction type -     tadalafil 2.5 MG TABS; Take 1 tablet (2.5 mg total) by mouth daily.  MDD (major depressive disorder), single episode, severe , no psychosis (HCC) -     buPROPion (WELLBUTRIN XL) 150 MG 24 hr tablet; Take 1 tablet (150 mg total) by mouth daily.  Onychomycosis of toenail -     terbinafine (LAMISIL) 250 MG tablet; Take 1 tablet (250 mg total) by mouth daily.  Diabetic gastroparesis associated with type 1 diabetes mellitus ([place])    No orders of the defined types were placed in this encounter.   Follow-up: Return in about 4 weeks (around 01/20/2016) for diabetes .   Boykin Nearing MD

## 2015-12-23 NOTE — Patient Instructions (Addendum)
Dreden was seen today for diabetes.  Diagnoses and all orders for this visit:  Type 1 diabetes mellitus with diabetic polyneuropathy (HCC) -     POCT glucose (manual entry) -     Insulin Glargine (LANTUS SOLOSTAR) 100 UNIT/ML Solostar Pen; Inject 30-60 Units into the skin at bedtime. Titration as needed -     insulin aspart (NOVOLOG) 100 UNIT/ML FlexPen; Inject 10 Units into the skin 3 (three) times daily with meals. -     COMPLETE METABOLIC PANEL WITH GFR  Chronic diarrhea -     cholestyramine (QUESTRAN) 4 g packet; Take 1 packet (4 g total) by mouth 3 (three) times daily with meals.  Diabetic gastroparesis ([place]) -     Discontinue: sucralfate (CARAFATE) 1 g tablet; Take 1 tablet (1 g total) by mouth 4 (four) times daily -  with meals and at bedtime. -     sucralfate (CARAFATE) 1 GM/10ML suspension; Take 10 mLs (1 g total) by mouth 4 (four) times daily -  with meals and at bedtime. -     famotidine (PEPCID) 20 MG tablet; Take 1 tablet (20 mg total) by mouth 2 (two) times daily.  Erectile dysfunction, unspecified erectile dysfunction type -     tadalafil 2.5 MG TABS; Take 1 tablet (2.5 mg total) by mouth daily.  MDD (major depressive disorder), single episode, severe , no psychosis (HCC) -     buPROPion (WELLBUTRIN XL) 150 MG 24 hr tablet; Take 1 tablet (150 mg total) by mouth daily.  Onychomycosis of toenail -     terbinafine (LAMISIL) 250 MG tablet; Take 1 tablet (250 mg total) by mouth daily.   Titrate up on your lantus for 30 to get blood sugar to goal Increase by 2 U every 3 days to fasting sugar goal of 80-130 If below goal back off by 2 U   Diabetes blood sugar goals  Fasting (in AM before breakfast, 8 hrs of no eating or drinking (except water or unsweetened coffee or tea): 80-130 2 hrs after meals: < 160,   No low sugars: nothing < 70   Dr. Adrian Blackwater

## 2015-12-26 LAB — COMPLETE METABOLIC PANEL WITH GFR: SODIUM: 145 mmol/L (ref 135–146)

## 2015-12-27 ENCOUNTER — Telehealth: Payer: Self-pay

## 2015-12-27 NOTE — Addendum Note (Signed)
Addended by: Boykin Nearing on: 12/27/2015 09:56 AM   Modules accepted: Orders

## 2015-12-28 NOTE — Telephone Encounter (Signed)
Patient verified DOB

## 2015-12-29 ENCOUNTER — Ambulatory Visit: Payer: Self-pay

## 2015-12-29 ENCOUNTER — Ambulatory Visit: Payer: Self-pay | Attending: Internal Medicine | Admitting: Licensed Clinical Social Worker

## 2015-12-29 DIAGNOSIS — F322 Major depressive disorder, single episode, severe without psychotic features: Secondary | ICD-10-CM

## 2015-12-29 DIAGNOSIS — E1042 Type 1 diabetes mellitus with diabetic polyneuropathy: Secondary | ICD-10-CM | POA: Insufficient documentation

## 2015-12-29 LAB — COMPLETE METABOLIC PANEL WITH GFR
ALT: 25 U/L (ref 9–46)
AST: 24 U/L (ref 10–40)
Albumin: 3.9 g/dL (ref 3.6–5.1)
Alkaline Phosphatase: 87 U/L (ref 40–115)
BUN: 9 mg/dL (ref 7–25)
CALCIUM: 9 mg/dL (ref 8.6–10.3)
CHLORIDE: 103 mmol/L (ref 98–110)
CO2: 25 mmol/L (ref 20–31)
Creat: 1.15 mg/dL (ref 0.60–1.35)
GFR, EST NON AFRICAN AMERICAN: 80 mL/min (ref >=60)
Glucose, Bld: 158 mg/dL — ABNORMAL HIGH (ref 65–99)
POTASSIUM: 4.4 mmol/L (ref 3.5–5.3)
Sodium: 139 mmol/L (ref 135–146)
Total Bilirubin: 0.4 mg/dL (ref 0.2–1.2)
Total Protein: 6.6 g/dL (ref 6.1–8.1)

## 2015-12-29 NOTE — BH Specialist Note (Signed)
Session Start time: 12:00 pm   End Time: 12:30 pm Total Time:  30 minutes Type of Service: Behavioral Health - Individual/Family Interpreter: No.   Interpreter Name & Language: N/A # [facility] Visits July 2017-June 2018: 1st   SUBJECTIVE: Duane [name] is a 40 y.o. male  Pt. was referred by Dr. Adrian Blackwater for:  anxiety and depression. Pt. reports the following symptoms/concerns: difficulty sleeping, racing thoughts, and low energy Duration of problem:  Pt reported onset of symptoms triggered after multiple hospitalizations approx 2 years ago Severity: severe Previous treatment: None reported   OBJECTIVE: Mood: Pleasant & Affect: Appropriate Risk of harm to self or others: Pt denied SI/HI Assessments administered: None administered  LIFE CONTEXT:  Family & Social: Pt resides with his aunt. Pt's support consists of aunt and mother. Pt has three teenage sons and extended family in [place] School/ Work: Pt currently receives disability. He is interested in obtaining a part-time job Self-Care: Pt has difficulty sleeping. He recently joined a gym to implement an exercise routine. Pt denied substance use Life changes: Pt recently moved due to strained relationship between him and mother's husband. Physical health was reported to have improved What is important to pt/family (values): Family, Independence    GOALS ADDRESSED:  Decrease symptoms of depression Decrease symptoms of anxiety  INTERVENTIONS: Solution Focused, Strength-based and Supportive   ASSESSMENT:  Pt currently experiencing depression and anxiety triggered by multiple hospitalizations and financial stress after loss of employment. Pt reports difficulty sleeping, racing thoughts, and low energy. Pt has limited support. Pt may benefit from psychotherapy and medication management. LCSWA educated pt on the cycle of depression and the importance of implementing healthy coping strategies to decrease symptoms of depression. Pt  identified healthy activities that he can complete on a weekly basis to assist in decreasing symptoms of depression and anxiety. LCSWA provided pt with community resources for behavioral health and crisis intervention.     PLAN: 1. F/U with behavioral health clinician: Pt was encouraged to contact LCSWA if symptoms worsen or fail to improve to schedule behavioral appointments at [facility]. 2. Behavioral Health meds: Wellbutrin 3. Behavioral recommendations: LCSWA recommends that pt follows through with identified coping strategies discussed. Pt is encouraged to schedule follow up appointment with LCSWA 4. Referral: Brief Counseling/Psychotherapy, [employer], Problem-solving teaching/coping strategies, Psychoeducation and Supportive Counseling 5. From scale of 1-10, how likely are you to follow plan: 7/10   Rebekah Chesterfield, MSW, [place] [employer] 12/29/15 5:49 pm  Warmhandoff: no

## 2015-12-29 NOTE — Progress Notes (Signed)
Patient here for lab visit  

## 2015-12-30 ENCOUNTER — Telehealth: Payer: Self-pay

## 2015-12-30 NOTE — Telephone Encounter (Signed)
Pt was called and informed of lab results that we have. Pt is also aware that he may have to return to do more lab work.

## 2016-01-02 ENCOUNTER — Other Ambulatory Visit: Payer: Self-pay | Admitting: Family Medicine

## 2016-01-03 MED FILL — GABAPENTIN 300 MG CAPSULE: 300 | 30 days supply | Qty: 30 | Fill #0

## 2016-01-03 MED FILL — METOCLOPRAMIDE 10 MG TABLET: 10 | 30 days supply | Qty: 90 | Fill #0

## 2016-01-23 ENCOUNTER — Other Ambulatory Visit: Payer: Self-pay | Admitting: Family Medicine

## 2016-01-24 MED FILL — METOPROLOL TARTRATE 50 MG T: 50 | 30 days supply | Qty: 60 | Fill #0

## 2016-01-24 MED FILL — $LANTUS 100 UNITS/ML VIAL: 100 | 30 days supply | Qty: 20 | Fill #0

## 2016-01-24 MED FILL — PANTOPRAZOLE SOD DR 40 MG T: 40 | 30 days supply | Qty: 60 | Fill #0

## 2016-01-31 MED FILL — CHOLESTYRAMINE LIGHT POWDER: 4 | 30 days supply | Qty: 210 | Fill #1

## 2016-01-31 MED FILL — GABAPENTIN 300 MG CAPSULE: 300 | 30 days supply | Qty: 30 | Fill #1

## 2016-01-31 MED FILL — TRUE METRIX TEST STRIP: 30 days supply | Qty: 100 | Fill #1

## 2016-02-15 ENCOUNTER — Other Ambulatory Visit: Payer: Self-pay | Admitting: Family Medicine

## 2016-02-15 MED FILL — NOVOLOG FLEXPEN SYRINGE: 100 | 6 days supply | Qty: 3 | Fill #0

## 2016-02-16 MED FILL — METOCLOPRAMIDE 10 MG TABLET: 10 | 30 days supply | Qty: 90 | Fill #0

## 2016-03-05 MED FILL — PANTOPRAZOLE SOD DR 40 MG T: 40 | 30 days supply | Qty: 60 | Fill #1

## 2016-03-05 MED FILL — METOPROLOL TARTRATE 50 MG T: 50 | 30 days supply | Qty: 60 | Fill #1

## 2016-03-05 MED FILL — GABAPENTIN 300 MG CAPSULE: 300 | 30 days supply | Qty: 30 | Fill #2

## 2016-03-06 ENCOUNTER — Telehealth: Payer: Self-pay | Admitting: Family Medicine

## 2016-03-06 DIAGNOSIS — K529 Noninfective gastroenteritis and colitis, unspecified: Secondary | ICD-10-CM

## 2016-03-06 NOTE — Telephone Encounter (Signed)
Pt called requesting to speak to nurse regarding diarrhea, please f/up

## 2016-03-16 MED FILL — CHOLESTYRAMINE LIGHT POWDER: 4 | 15 days supply | Qty: 210 | Fill #2

## 2016-03-16 MED FILL — !NOVOLOG FLEXPEN SYRINGE 1: 100/ML | 6 days supply | Qty: 3 | Fill #1

## 2016-03-16 MED FILL — !LANTUS 100 UNITS/ML VIAL: 100 | 30 days supply | Qty: 20 | Fill #1

## 2016-03-19 NOTE — Telephone Encounter (Signed)
Pt. Came into facility requesting to speak with the nurse. Pt. States he has diarrhea.  Please f/u with pt.

## 2016-03-20 MED ORDER — DIPHENOXYLATE-ATROPINE 2.5-0.025 MG PO TABS: 1.0000 | ORAL_TABLET | Freq: Four times a day (QID) | ORAL | 0 refills | Status: DC | PRN

## 2016-03-20 NOTE — Telephone Encounter (Signed)
Concern that he has had diarrhea despite what he eats. Has not had a regular BM. Has loose or watery stools. He takes cholestyramine since having gallbladder removed. This is now becoming nonstop and daily living is unpredictable because he does not understand what is triggering this. He feels that problem is more than this and states concerns with IBS or colitis.  He is unable to is get relief with OTC meds. Has lots of flatulence, states he can have between 2- 10 BM daily. He has h/o gastroparesis, DM. Please advise.

## 2016-03-20 NOTE — Telephone Encounter (Signed)
Notified patient of message.

## 2016-03-20 NOTE — Telephone Encounter (Signed)
Patient advised to have OV to check for blood in stool GI referral placed for chronic diarrhea  Lomotil for diarrhea, he may pick up Rx, placed up front for pick up Advised her start taking fiber supplement like metamucil to help bulk and slow down bowel movements

## 2016-03-23 ENCOUNTER — Other Ambulatory Visit: Payer: Self-pay

## 2016-03-23 DIAGNOSIS — E1042 Type 1 diabetes mellitus with diabetic polyneuropathy: Secondary | ICD-10-CM

## 2016-03-23 MED ORDER — INSULIN GLARGINE 100 UNIT/ML SOLOSTAR PEN: 30.0000 [IU] | PEN_INJECTOR | Freq: Every day | SUBCUTANEOUS | 3 refills | Status: DC

## 2016-03-23 MED ORDER — INSULIN LISPRO 100 UNIT/ML (KWIKPEN): 10.0000 [IU] | PEN_INJECTOR | Freq: Three times a day (TID) | SUBCUTANEOUS | 3 refills | Status: DC

## 2016-03-30 ENCOUNTER — Encounter: Payer: Self-pay | Admitting: Family Medicine

## 2016-03-30 ENCOUNTER — Ambulatory Visit: Payer: Self-pay | Attending: Family Medicine | Admitting: Family Medicine

## 2016-03-30 VITALS — BP 153/99 | HR 74 | Temp 98.2°F | Ht 75.0 in | Wt 326.0 lb

## 2016-03-30 DIAGNOSIS — K3184 Gastroparesis: Secondary | ICD-10-CM | POA: Insufficient documentation

## 2016-03-30 DIAGNOSIS — K589 Irritable bowel syndrome without diarrhea: Secondary | ICD-10-CM | POA: Insufficient documentation

## 2016-03-30 DIAGNOSIS — I10 Essential (primary) hypertension: Secondary | ICD-10-CM | POA: Insufficient documentation

## 2016-03-30 DIAGNOSIS — F329 Major depressive disorder, single episode, unspecified: Secondary | ICD-10-CM | POA: Insufficient documentation

## 2016-03-30 DIAGNOSIS — F322 Major depressive disorder, single episode, severe without psychotic features: Secondary | ICD-10-CM

## 2016-03-30 DIAGNOSIS — E1143 Type 2 diabetes mellitus with diabetic autonomic (poly)neuropathy: Secondary | ICD-10-CM

## 2016-03-30 DIAGNOSIS — N529 Male erectile dysfunction, unspecified: Secondary | ICD-10-CM | POA: Insufficient documentation

## 2016-03-30 DIAGNOSIS — E0843 Diabetes mellitus due to underlying condition with diabetic autonomic (poly)neuropathy: Secondary | ICD-10-CM

## 2016-03-30 DIAGNOSIS — Z9049 Acquired absence of other specified parts of digestive tract: Secondary | ICD-10-CM | POA: Insufficient documentation

## 2016-03-30 DIAGNOSIS — E1043 Type 1 diabetes mellitus with diabetic autonomic (poly)neuropathy: Secondary | ICD-10-CM | POA: Insufficient documentation

## 2016-03-30 DIAGNOSIS — Z794 Long term (current) use of insulin: Secondary | ICD-10-CM | POA: Insufficient documentation

## 2016-03-30 DIAGNOSIS — K529 Noninfective gastroenteritis and colitis, unspecified: Secondary | ICD-10-CM | POA: Insufficient documentation

## 2016-03-30 DIAGNOSIS — E1042 Type 1 diabetes mellitus with diabetic polyneuropathy: Secondary | ICD-10-CM

## 2016-03-30 LAB — COMPLETE METABOLIC PANEL WITH GFR
ALBUMIN: 3.7 g/dL (ref 3.6–5.1)
ALK PHOS: 95 U/L (ref 40–115)
ALT: 38 U/L (ref 9–46)
AST: 25 U/L (ref 10–40)
BUN: 14 mg/dL (ref 7–25)
CALCIUM: 8.8 mg/dL (ref 8.6–10.3)
CHLORIDE: 105 mmol/L (ref 98–110)
CO2: 25 mmol/L (ref 20–31)
Creat: 1.45 mg/dL — ABNORMAL HIGH (ref 0.60–1.35)
GFR, EST AFRICAN AMERICAN: 70 mL/min (ref >=60)
GFR, EST NON AFRICAN AMERICAN: 60 mL/min (ref >=60)
Glucose, Bld: 205 mg/dL — ABNORMAL HIGH (ref 65–99)
POTASSIUM: 4.4 mmol/L (ref 3.5–5.3)
Sodium: 139 mmol/L (ref 135–146)
Total Bilirubin: 0.3 mg/dL (ref 0.2–1.2)
Total Protein: 6.7 g/dL (ref 6.1–8.1)

## 2016-03-30 LAB — GLUCOSE, POCT (MANUAL RESULT ENTRY): POC GLUCOSE: 198 mg/dL — AB (ref 70–99)

## 2016-03-30 LAB — POCT GLYCOSYLATED HEMOGLOBIN (HGB A1C): Hemoglobin A1C: 10.2

## 2016-03-30 MED ORDER — METOPROLOL TARTRATE 50 MG PO TABS: 50.0000 mg | ORAL_TABLET | Freq: Two times a day (BID) | ORAL | 5 refills | Status: DC

## 2016-03-30 MED ORDER — PANTOPRAZOLE SODIUM 40 MG PO TBEC: 40.0000 mg | DELAYED_RELEASE_TABLET | Freq: Two times a day (BID) | ORAL | 5 refills | Status: DC

## 2016-03-30 MED ORDER — NORTRIPTYLINE HCL 10 MG PO CAPS: 10.0000 mg | ORAL_CAPSULE | Freq: Every day | ORAL | 2 refills | Status: DC

## 2016-03-30 MED ORDER — SUCRALFATE 1 G PO TABS: 1.0000 g | ORAL_TABLET | Freq: Three times a day (TID) | ORAL | 5 refills | Status: DC

## 2016-03-30 MED ORDER — GLUCOSE BLOOD VI STRP: ORAL_STRIP | 12 refills | Status: DC

## 2016-03-30 MED ORDER — GABAPENTIN 300 MG PO CAPS: 300.0000 mg | ORAL_CAPSULE | Freq: Every day | ORAL | 3 refills | Status: DC

## 2016-03-30 MED ORDER — INSULIN LISPRO 100 UNIT/ML (KWIKPEN): PEN_INJECTOR | SUBCUTANEOUS | 3 refills | Status: DC

## 2016-03-30 NOTE — Patient Instructions (Addendum)
Duane [name] was seen today for diabetes and diarrhea.  Diagnoses and all orders for this visit:  Type 1 diabetes mellitus with diabetic polyneuropathy (HCC) -     POCT glucose (manual entry) -     POCT glycosylated hemoglobin (Hb A1C) -     glucose blood test strip; Use three times daily -     insulin lispro (HUMALOG KWIKPEN) 100 UNIT/ML KiwkPen; Take 15 U of Humalog with breakfast and lunch, and 10 U with dinner  Chronic diarrhea -     COMPLETE METABOLIC PANEL WITH GFR -     nortriptyline (PAMELOR) 10 MG capsule; Take 1 capsule (10 mg total) by mouth at bedtime.  Diabetic gastroparesis (HCC) -     pantoprazole (PROTONIX) 40 MG tablet; Take 1 tablet (40 mg total) by mouth 2 (two) times daily.  Diabetic gastroparesis associated with type 1 diabetes mellitus (HCC) -     sucralfate (CARAFATE) 1 g tablet; Take 1 tablet (1 g total) by mouth 4 (four) times daily -  with meals and at bedtime.  Essential hypertension -     metoprolol (LOPRESSOR) 50 MG tablet; Take 1 tablet (50 mg total) by mouth 2 (two) times daily.  Diabetic autonomic neuropathy associated with diabetes mellitus due to underlying condition (HCC) -     gabapentin (NEURONTIN) 300 MG capsule; Take 1 capsule (300 mg total) by mouth at bedtime.  MDD (major depressive disorder), single episode, severe , no psychosis ([place]) -     nortriptyline (PAMELOR) 10 MG capsule; Take 1 capsule (10 mg total) by mouth at bedtime.  Other orders -     Cancel: metoCLOPramide (REGLAN) 10 MG tablet;  -     Cancel: sucralfate (CARAFATE) 1 GM/10ML suspension; Take 10 mLs (1 g total) by mouth 4 (four) times daily -  with meals and at bedtime.  carafate tablets available in pharmacy, ordered   After I review your labs I can prescribed a medication to help with leg swelling and reduce pressure. Labs are needed due to diarrhea and risk of low potassium, dehydration and acute kidney injury. For now, keep sodium intake low.  Start lomotil   Please apply  for medicare and the orange card as both will help you access GI. If medicare comes through the orange card will be voided. Orange card applications are the the front desk and processed on site.   For depressed mood, trouble sleeping and diarrhea- nightly pamelor 10 mg, this is a tricyclic antidepressant that is sedating and has a side effect of slowing GI motility. Starting at a low dose if you tolerate this, we can increase to 20 mg in two weeks.   I will look for another available treatment for IBS-D.   F/u in 4 weeks for diarrhea and diabetes   Dr. Adrian Blackwater     Recommend dietary changes for possible IBS with diarrhea:  1. Exclude foods that increase flatulence (eg, beans, onions, celery, carrots, raisins, bananas, apricots, prunes, Brussels sprouts, wheat germ, pretzels, and bagels), alcohol, and caffeine.  Have symptoms improved?   If yes, continue to excluded above  If no,  2. Exclude lactose   Have symptoms improved?  If yes, continue to exclude 1 and 2.  If no,  3. Exclude FODMAP Characteristics and sources of common FODMAPs  F Fermentable    O Oligosaccharides Fructans, galacto-oligosaccharides Wheat, barley, rye, onion, leek, white part of spring onion, garlic, shallots, artichokes, beetroot, fennel, peas, chicory, pistachio, cashews, legumes, lentils, and chickpeas  D  Disaccharides Lactose Milk, custard, ice cream, and yogurt  M Monosaccharides "Free fructose" (fructose in excess of glucose) Apples, pears, mangoes, cherries, watermelon, asparagus, sugar snap peas, honey, high-fructose corn syrup  A And  P Polyols Sorbitol, mannitol, maltitol, and xylitol Apples, pears, apricots, cherries, nectarines, peaches, plums, watermelon, mushrooms, cauliflower, artificially sweetened chewing gum and confectionery   Be sure to drink plenty of fluids to keep up with water loses from diarrhea

## 2016-03-30 NOTE — Progress Notes (Signed)
Subjective:  Patient ID: Duane [name], male    DOB: [birth year]  Age: 41 y.o. MRN: [id number]  CC: Diabetes and Diarrhea   HPI Duane [name] presents for    1. Diarrhea: x  1 year. Usually in early mornings. Watery to soft bowel movements. Last 4-5 hrs up to all day. No identified food triggers. Sometimes cheeses or dairy items cause pain. Has hx of cholecystectomy. Diarrhea is associated with gas pain. Bloating. No fever. No blood in stool. Denies constipation. Has IDDM type 1 with gastroparesis. ? IBS. No family history of IBS/IBD or lactose intolerance. He has decreased gas producing foods and lactose from his diet. This has not helped him much. He has darks stools at times, brown or green. No frank blood.   He is taking [place]. He has not filled lomotil. GI referral was already placed. He has diarrhea 5 days of the week. Diarrhea is worse in early AM between 3-5 AM. He has excessive gas.    2. CHRONIC DIABETES  Disease Monitoring  Blood Sugar Ranges:   Fasting: 120-160  Post prandial: 200-320  Polyuria: no   Visual problems: yes   Medication Compliance: yes,taking lantus 30 U daily, novolog 9-10 U after meals  Medication Side Effects  Hypoglycemia: no   Preventitive Health Care  Eye Exam: due   Foot Exam: done today   Diet pattern: eats irregular meals, eating 1-2 meals a day.   Exercise: yes   3. Depression: feels depressed and has trouble sleeping x one year. Feeling depressed. Does not sleep at night. No history of serious depression. Not feeling like himself. Under a lot of tension. He is depressed about erectile dysfunction. He is having trouble getting and keeping erection. He is also depressed. He is unemployed and on disability.  He takes wellbutrin inconsistently.   4. HTN: taking metoprolol daily. Noticed leg swelling. Has gained weight.   Social History  Substance Use Topics  . Smoking status: Never Smoker  . Smokeless tobacco: Never Used  . Alcohol use  No    Outpatient Medications Prior to Visit  Medication Sig Dispense Refill  . Blood Glucose Monitoring Suppl (TRUE METRIX METER) W/DEVICE KIT 1 each by Does not apply route 4 (four) times daily -  before meals and at bedtime. 1 kit ea  . buPROPion (WELLBUTRIN XL) 150 MG 24 hr tablet Take 1 tablet (150 mg total) by mouth daily. 30 tablet 3  . cholestyramine (QUESTRAN) 4 g packet Take 1 packet (4 g total) by mouth 3 (three) times daily with meals. 90 each 12  . diphenoxylate-atropine (LOMOTIL) 2.5-0.025 MG tablet Take 1 tablet by mouth 4 (four) times daily as needed for diarrhea or loose stools. 90 tablet 0  . famotidine (PEPCID) 20 MG tablet Take 1 tablet (20 mg total) by mouth 2 (two) times daily. 30 tablet 0  . gabapentin (NEURONTIN) 300 MG capsule TAKE 1 CAPSULE BY MOUTH AT BEDTIME. 30 capsule 3  . glucose blood test strip Use as instructed 100 each 12  . insulin aspart (NOVOLOG) 100 UNIT/ML FlexPen Inject 10 Units into the skin 3 (three) times daily with meals. 15 mL 3  . Insulin Glargine (LANTUS SOLOSTAR) 100 UNIT/ML Solostar Pen Inject 30-60 Units into the skin at bedtime. Titration as needed 90 mL 3  . insulin lispro (HUMALOG KWIKPEN) 100 UNIT/ML KiwkPen Inject 0.1 mLs (10 Units total) into the skin 3 (three) times daily. 45 mL 3  . Insulin Pen Needle 31G X 8 MM  MISC Inject 1 each into the skin 3 (three) times daily. Check blood sugar TID and QHS 100 each 3  . metoCLOPramide (REGLAN) 10 MG tablet TAKE 1 TABLET BY MOUTH 3 TIMES DAILY BEFORE MEALS AS NEEDED 90 tablet 0  . metoprolol (LOPRESSOR) 50 MG tablet TAKE 1 TABLET BY MOUTH 2 TIMES DAILY. 60 tablet 3  . pantoprazole (PROTONIX) 40 MG tablet TAKE 1 TABLET BY MOUTH 2 TIMES DAILY 60 tablet 2  . promethazine (PHENERGAN) 25 MG tablet Take 1 tablet (25 mg total) by mouth every 8 (eight) hours as needed for nausea or vomiting. 60 tablet 0  . sucralfate (CARAFATE) 1 GM/10ML suspension Take 10 mLs (1 g total) by mouth 4 (four) times daily -   with meals and at bedtime. 120 mL 5  . tadalafil 2.5 MG TABS Take 1 tablet (2.5 mg total) by mouth daily. 30 tablet 5  . terbinafine (LAMISIL) 250 MG tablet Take 1 tablet (250 mg total) by mouth daily. 30 tablet 2  . TRUEPLUS LANCETS 28G MISC 1 each by Does not apply route 4 (four) times daily -  before meals and at bedtime. 100 each 12   No facility-administered medications prior to visit.     ROS Review of Systems  Constitutional: Negative for chills, fatigue, fever and unexpected weight change.  Eyes: Positive for visual disturbance.  Respiratory: Negative for cough and shortness of breath.   Cardiovascular: Negative for chest pain, palpitations and leg swelling.  Gastrointestinal: Positive for diarrhea. Negative for abdominal pain, blood in stool, constipation, nausea and vomiting.  Endocrine: Negative for polydipsia, polyphagia and polyuria.  Musculoskeletal: Positive for arthralgias and joint swelling. Negative for back pain, gait problem, myalgias and neck pain.  Skin: Negative for rash.  Allergic/Immunologic: Negative for immunocompromised state.  Hematological: Negative for adenopathy. Does not bruise/bleed easily.  Psychiatric/Behavioral: Positive for dysphoric mood and sleep disturbance. Negative for suicidal ideas. The patient is nervous/anxious.     Objective:  BP (!) 153/99 (BP Location: Left Arm, Patient Position: Sitting, Cuff Size: Large)   Pulse 74   Temp 98.2 F (36.8 C) (Oral)   Ht 6' 3"  (1.905 m)   Wt (!) 326 lb (147.9 kg)   SpO2 99%   BMI 40.75 kg/m   BP/Weight 03/30/2016 09/60/4540 10/20/1189  Systolic BP 478 295 621  Diastolic BP 99 87 82  Wt. (Lbs) 326 314.8 300  BMI 40.75 39.35 37.5    Physical Exam  Constitutional: He appears well-developed and well-nourished. No distress.  HENT:  Head: Normocephalic and atraumatic.  Neck: Normal range of motion. Neck supple.  Cardiovascular: Normal rate, regular rhythm, normal heart sounds and intact distal  pulses.   Pulmonary/Chest: Effort normal and breath sounds normal.  Musculoskeletal: He exhibits edema (b/l trace ).  Neurological: He is alert.  Skin: Skin is warm and dry. No rash noted. No erythema.     Psychiatric: He has a normal mood and affect.    Lab Results  Component Value Date   HGBA1C 10.2 03/30/2016   CBG 198  Depression screen [facility] 2/9 12/23/2015 04/22/2015 12/30/2014  Decreased Interest 1 2 0  Down, Depressed, Hopeless 1 3 0  PHQ - 2 Score 2 5 0  Altered sleeping 3 3 -  Tired, decreased energy 2 2 -  Change in appetite 3 2 -  Feeling bad or failure about yourself  2 2 -  Trouble concentrating 1 2 -  Moving slowly or fidgety/restless 1 2 -  Suicidal thoughts  1 1 -  PHQ-9 Score 15 19 -   GAD 7 : Generalized Anxiety Score 12/23/2015  Nervous, Anxious, on Edge 1  Control/stop worrying 3  Worry too much - different things 3  Trouble relaxing 2  Restless 0  Easily annoyed or irritable 2  Afraid - awful might happen 1  Total GAD 7 Score 12      Assessment & Plan:   Duane [name] was seen today for diabetes and diarrhea.  Diagnoses and all orders for this visit:  Type 1 diabetes mellitus with diabetic polyneuropathy (HCC) -     POCT glucose (manual entry) -     POCT glycosylated hemoglobin (Hb A1C) -     glucose blood test strip; Use three times daily -     insulin lispro (HUMALOG KWIKPEN) 100 UNIT/ML KiwkPen; Take 15 U of Humalog with breakfast and lunch, and 10 U with dinner  Chronic diarrhea -     COMPLETE METABOLIC PANEL WITH GFR -     nortriptyline (PAMELOR) 10 MG capsule; Take 1 capsule (10 mg total) by mouth at bedtime.  Diabetic gastroparesis (HCC) -     pantoprazole (PROTONIX) 40 MG tablet; Take 1 tablet (40 mg total) by mouth 2 (two) times daily.  Diabetic gastroparesis associated with type 1 diabetes mellitus (HCC) -     sucralfate (CARAFATE) 1 g tablet; Take 1 tablet (1 g total) by mouth 4 (four) times daily -  with meals and at  bedtime.  Essential hypertension -     metoprolol (LOPRESSOR) 50 MG tablet; Take 1 tablet (50 mg total) by mouth 2 (two) times daily.  Diabetic autonomic neuropathy associated with diabetes mellitus due to underlying condition (HCC) -     gabapentin (NEURONTIN) 300 MG capsule; Take 1 capsule (300 mg total) by mouth at bedtime.  MDD (major depressive disorder), single episode, severe , no psychosis ([place]) -     nortriptyline (PAMELOR) 10 MG capsule; Take 1 capsule (10 mg total) by mouth at bedtime.  Other orders -     Cancel: metoCLOPramide (REGLAN) 10 MG tablet;  -     Cancel: sucralfate (CARAFATE) 1 GM/10ML suspension; Take 10 mLs (1 g total) by mouth 4 (four) times daily -  with meals and at bedtime.    No orders of the defined types were placed in this encounter.   Follow-up: Return in about 4 weeks (around 04/27/2016).   Boykin Nearing MD

## 2016-03-30 NOTE — Progress Notes (Signed)
Pt is here today for diabetes. Pt also states that he is still having diarrhea. Pt is having swelling in legs.

## 2016-04-02 MED ORDER — CHLORTHALIDONE 25 MG PO TABS: 25.0000 mg | ORAL_TABLET | Freq: Every day | ORAL | 2 refills | Status: DC

## 2016-04-02 MED FILL — NOVOLOG FLEXPEN SYRINGE: 100 | 10 days supply | Qty: 3 | Fill #2

## 2016-04-02 MED FILL — SUCRALFATE 1 GM TABLET: 1 | 30 days supply | Qty: 120 | Fill #0

## 2016-04-02 MED FILL — ?CHLORTHALIDONE 25 MG TABLE: 25 | 30 days supply | Qty: 30 | Fill #0

## 2016-04-02 MED FILL — METOPROLOL TARTRATE 50 MG T: 50 | 30 days supply | Qty: 60 | Fill #0

## 2016-04-02 MED FILL — GABAPENTIN 300 MG CAPSULE: 300 | 30 days supply | Qty: 30 | Fill #0

## 2016-04-02 MED FILL — ?PANTOPRAZOLE SOD DR 40MG: 40 MG | 30 days supply | Qty: 60 | Fill #0

## 2016-04-02 MED FILL — NORTRIPTYLINE HCL 10 MG CAP: 10 | 30 days supply | Qty: 30 | Fill #0

## 2016-04-02 MED FILL — TRUE METRIX TEST STRIP: 33 days supply | Qty: 100 | Fill #0

## 2016-04-02 NOTE — Assessment & Plan Note (Signed)
Chronic non-infectious diarrhea consistent with IBS-D, patient also does not have a gallbladder  Plan: Continue questran Add lomotil GI referral placed

## 2016-04-02 NOTE — Assessment & Plan Note (Signed)
HTN in swelling in patient with diabetes Continue metoprolol 50 mg BID  Electrolytes normal Add chlorthalidone 25 mg daily

## 2016-04-04 ENCOUNTER — Telehealth: Payer: Self-pay

## 2016-04-04 NOTE — Telephone Encounter (Signed)
Pt was called and informed of lab results and medication being sent over to his pharmacy.

## 2016-04-23 ENCOUNTER — Other Ambulatory Visit: Payer: Self-pay | Admitting: Physician Assistant

## 2016-04-23 ENCOUNTER — Other Ambulatory Visit: Payer: Self-pay | Admitting: Family Medicine

## 2016-04-23 MED FILL — METOCLOPRAMIDE 10 MG TABLET: 10 | 30 days supply | Qty: 90 | Fill #0

## 2016-04-23 MED FILL — PROMETHAZINE 25 MG TABLET: 25 | 20 days supply | Qty: 60 | Fill #0

## 2016-04-23 MED FILL — CHOLESTYRAMINE LIGHT POWDER: 4 | 15 days supply | Qty: 210 | Fill #3

## 2016-04-26 MED FILL — NOVOLOG FLEXPEN SYRINGE: 100 | 10 days supply | Qty: 3 | Fill #3

## 2016-05-01 ENCOUNTER — Ambulatory Visit: Payer: Self-pay | Admitting: Family Medicine

## 2016-05-14 MED FILL — NOVOLOG FLEXPEN SYRINGE: 100 | 10 days supply | Qty: 3 | Fill #4

## 2016-05-14 MED FILL — GABAPENTIN 300 MG CAPSULE: 300 | 30 days supply | Qty: 30 | Fill #1

## 2016-05-14 MED FILL — NORTRIPTYLINE HCL 10 MG CAP: 10 | 30 days supply | Qty: 30 | Fill #1

## 2016-05-14 MED FILL — TRUE METRIX TEST STRIP: 33 days supply | Qty: 100 | Fill #1

## 2016-05-14 MED FILL — ?PANTOPRAZOLE SOD DR 40MG: 40 MG | 30 days supply | Qty: 60 | Fill #1

## 2016-05-28 ENCOUNTER — Encounter: Payer: Self-pay | Admitting: Family Medicine

## 2016-06-01 ENCOUNTER — Encounter: Payer: Self-pay | Admitting: Family Medicine

## 2016-06-01 ENCOUNTER — Ambulatory Visit: Payer: Self-pay | Attending: Family Medicine | Admitting: Family Medicine

## 2016-06-01 VITALS — BP 140/98 | HR 87 | Temp 97.9°F | Ht 75.0 in | Wt 338.0 lb

## 2016-06-01 DIAGNOSIS — Z87440 Personal history of urinary (tract) infections: Secondary | ICD-10-CM | POA: Insufficient documentation

## 2016-06-01 DIAGNOSIS — Z794 Long term (current) use of insulin: Secondary | ICD-10-CM | POA: Insufficient documentation

## 2016-06-01 DIAGNOSIS — F329 Major depressive disorder, single episode, unspecified: Secondary | ICD-10-CM | POA: Insufficient documentation

## 2016-06-01 DIAGNOSIS — F322 Major depressive disorder, single episode, severe without psychotic features: Secondary | ICD-10-CM

## 2016-06-01 DIAGNOSIS — M7989 Other specified soft tissue disorders: Secondary | ICD-10-CM | POA: Insufficient documentation

## 2016-06-01 DIAGNOSIS — R319 Hematuria, unspecified: Secondary | ICD-10-CM | POA: Insufficient documentation

## 2016-06-01 DIAGNOSIS — K529 Noninfective gastroenteritis and colitis, unspecified: Secondary | ICD-10-CM

## 2016-06-01 DIAGNOSIS — I10 Essential (primary) hypertension: Secondary | ICD-10-CM | POA: Insufficient documentation

## 2016-06-01 DIAGNOSIS — K589 Irritable bowel syndrome without diarrhea: Secondary | ICD-10-CM | POA: Insufficient documentation

## 2016-06-01 DIAGNOSIS — E1042 Type 1 diabetes mellitus with diabetic polyneuropathy: Secondary | ICD-10-CM | POA: Insufficient documentation

## 2016-06-01 DIAGNOSIS — E1043 Type 1 diabetes mellitus with diabetic autonomic (poly)neuropathy: Secondary | ICD-10-CM | POA: Insufficient documentation

## 2016-06-01 DIAGNOSIS — E1065 Type 1 diabetes mellitus with hyperglycemia: Secondary | ICD-10-CM | POA: Insufficient documentation

## 2016-06-01 DIAGNOSIS — N529 Male erectile dysfunction, unspecified: Secondary | ICD-10-CM | POA: Insufficient documentation

## 2016-06-01 DIAGNOSIS — Z9049 Acquired absence of other specified parts of digestive tract: Secondary | ICD-10-CM | POA: Insufficient documentation

## 2016-06-01 DIAGNOSIS — R1011 Right upper quadrant pain: Secondary | ICD-10-CM

## 2016-06-01 LAB — POCT GLYCOSYLATED HEMOGLOBIN (HGB A1C): HEMOGLOBIN A1C: 11.4

## 2016-06-01 LAB — POCT URINALYSIS DIPSTICK
Bilirubin, UA: NEGATIVE
Glucose, UA: 500
KETONES UA: NEGATIVE
Leukocytes, UA: NEGATIVE
Nitrite, UA: NEGATIVE
PH UA: 5.5 (ref 5.0–8.0)
PROTEIN UA: 300
SPEC GRAV UA: 1.02 (ref 1.010–1.025)
UROBILINOGEN UA: 0.2 U/dL

## 2016-06-01 LAB — HEMOCCULT GUIAC POC 1CARD (OFFICE): FECAL OCCULT BLD: NEGATIVE

## 2016-06-01 LAB — GLUCOSE, POCT (MANUAL RESULT ENTRY)
POC Glucose: 405 mg/dl — AB (ref 70–99)
POC Glucose: 404 mg/dl — AB (ref 70–99)

## 2016-06-01 MED ORDER — FUROSEMIDE 40 MG PO TABS: 40.0000 mg | ORAL_TABLET | Freq: Every day | ORAL | 3 refills | Status: DC

## 2016-06-01 MED ORDER — LISINOPRIL-HYDROCHLOROTHIAZIDE 20-12.5 MG PO TABS
2.0000 | ORAL_TABLET | Freq: Every day | ORAL | 3 refills | Status: DC
Start: 2016-06-01 — End: 2016-10-16

## 2016-06-01 MED ORDER — CEPHALEXIN 500 MG PO CAPS: 500.0000 mg | ORAL_CAPSULE | Freq: Four times a day (QID) | ORAL | 0 refills | Status: DC

## 2016-06-01 MED ORDER — FUROSEMIDE 40 MG PO TABS: 40.0000 mg | ORAL_TABLET | Freq: Every day | ORAL | 0 refills | Status: DC

## 2016-06-01 MED ORDER — INSULIN LISPRO 100 UNIT/ML (KWIKPEN): 15.0000 [IU] | PEN_INJECTOR | Freq: Three times a day (TID) | SUBCUTANEOUS | 3 refills | Status: DC

## 2016-06-01 MED ORDER — INSULIN GLARGINE 100 UNIT/ML SOLOSTAR PEN: 45.0000 [IU] | PEN_INJECTOR | Freq: Every day | SUBCUTANEOUS | 3 refills | Status: DC

## 2016-06-01 MED ORDER — ONDANSETRON HCL 8 MG PO TABS: 8.0000 mg | ORAL_TABLET | Freq: Every day | ORAL | 0 refills | Status: DC

## 2016-06-01 MED ORDER — INSULIN ASPART 100 UNIT/ML ~~LOC~~ SOLN
20.0000 [IU] | Freq: Once | SUBCUTANEOUS | Status: AC
  Administered 2016-06-01: 20 [IU] via SUBCUTANEOUS

## 2016-06-01 MED ORDER — CLONIDINE HCL 0.1 MG PO TABS
0.2000 mg | ORAL_TABLET | Freq: Once | ORAL | Status: AC
  Administered 2016-06-01: 0.2 mg via ORAL

## 2016-06-01 MED FILL — $LANTUS 100 UNITS/ML VIAL: 100 | 44 days supply | Qty: 20 | Fill #0

## 2016-06-01 MED FILL — !HUMALOG 100 UNITS/ML KWIKP: 100 | 33 days supply | Qty: 15 | Fill #0

## 2016-06-01 MED FILL — LISINOPRIL-HCTZ 20-12.5 MG: 20-12.5 | 30 days supply | Qty: 60 | Fill #0

## 2016-06-01 MED FILL — ?FUROSEMIDE 40 MG TABLET: 40 | 30 days supply | Qty: 30 | Fill #0

## 2016-06-01 MED FILL — ONDANSETRON HCL 8 MG TABLET: 8 | 30 days supply | Qty: 30 | Fill #0

## 2016-06-01 MED FILL — CEPHALEXIN 500 MG CAPSULE: 500 | 7 days supply | Qty: 28 | Fill #0

## 2016-06-01 NOTE — Progress Notes (Signed)
Subjective:  Patient ID: Duane [name], male    DOB: Jun 25, 1975  Age: 41 y.o. MRN: [id number]  CC: Diabetes; Hypertension; Diarrhea; and Depression   HPI Duane [name] has depression, diabetes type 1 uncontrolled with diabetic gastroparesis ( delayed gastric emptying study 08/2013) and diabetic neuropathy , HTN,  chronic diarrhea he presents for    1. Diarrhea: x  1 year. Usually in early mornings. Watery to soft bowel movements. Last 4-5 hrs up to all day. No identified food triggers. Sometimes cheeses or dairy items cause pain. Has hx of cholecystectomy. Diarrhea is associated with gas pain. Bloating. No fever. No blood in stool. Denies constipation. Has IDDM type 1 with gastroparesis. ? IBS. No family history of IBS/IBD or lactose intolerance. He has decreased gas producing foods and lactose from his diet. This has not helped him much. He has darks stools at times, brown or green. No frank blood.   He is s/p cholecystectomy. He is taking [place]. He tried [place] and lomotil without improvement. GI referral was already placed. His medicare will start in 08/2016.  He has diarrhea 5 days of the week. Diarrhea is worse in early AM between 3-5 AM. He has excessive gas. He has bloating. He reports pain in his right upper quadrant for the past 2 weeks induced by abdominal crunching.   DG abdomen 01/01/15 FINDINGS: No bowel dilation is seen to suggest obstruction. There is no adynamic ileus or free air. Mild colonic stool burden is noted.  Status post cholecystectomy, stable. No evidence of renal or ureteral stones. Bony structures are unremarkable.  IMPRESSION: 1. No acute findings. No evidence of bowel obstruction, generalized adynamic ileus or free air  2. CHRONIC DIABETES  Disease Monitoring  Blood Sugar Ranges:   Fasting: 250   Post prandial: 300-400   Polyuria: no   Visual problems: yes   Medication Compliance: yes,taking lantus 30 U daily, novolog 9-12 U with  meals Medication Side Effects  Hypoglycemia: no   Preventitive Health Care  Eye Exam: due   Foot Exam: done today   Diet pattern: eats irregular meals, eating 1-2 meals a day.   Exercise: yes   He admits to burning a few weeks ago that subsided. No discharge from penis. No unprotected sex. He does have a history of UTI.   3. HTN: he has ran out of chlorthalidone. He continues to take metoprolol 50 mg twice daily. He denies improvement in leg swelling with chlorthalidone. He wears copper compression socks with prolonged standing.    4. Depression: feels depressed and has trouble sleeping x one year. He is depressed about his poor health, he poor relationship with his parents and children. He is depressed about erectile dysfunction. He is having trouble getting and keeping erection. He is unemployed and on disability.  He takes wellbutrin inconsistently. He has not tried nortriptyline prescribed at last visit to help with sleep and mood.    Social History  Substance Use Topics  . Smoking status: Never Smoker  . Smokeless tobacco: Never Used  . Alcohol use No    Outpatient Medications Prior to Visit  Medication Sig Dispense Refill  . Blood Glucose Monitoring Suppl (TRUE METRIX METER) W/DEVICE KIT 1 each by Does not apply route 4 (four) times daily -  before meals and at bedtime. 1 kit ea  . buPROPion (WELLBUTRIN XL) 150 MG 24 hr tablet Take 1 tablet (150 mg total) by mouth daily. 30 tablet 3  . chlorthalidone (HYGROTON) 25 MG tablet Take  1 tablet (25 mg total) by mouth daily. 30 tablet 2  . cholestyramine (QUESTRAN) 4 g packet Take 1 packet (4 g total) by mouth 3 (three) times daily with meals. 90 each 12  . diphenoxylate-atropine (LOMOTIL) 2.5-0.025 MG tablet Take 1 tablet by mouth 4 (four) times daily as needed for diarrhea or loose stools. 90 tablet 0  . famotidine (PEPCID) 20 MG tablet Take 1 tablet (20 mg total) by mouth 2 (two) times daily. 30 tablet 0  . gabapentin (NEURONTIN)  300 MG capsule Take 1 capsule (300 mg total) by mouth at bedtime. 30 capsule 3  . glucose blood test strip Use three times daily 100 each 12  . insulin aspart (NOVOLOG) 100 UNIT/ML FlexPen Inject 10 Units into the skin 3 (three) times daily with meals. 15 mL 3  . Insulin Glargine (LANTUS SOLOSTAR) 100 UNIT/ML Solostar Pen Inject 30-60 Units into the skin at bedtime. Titration as needed 90 mL 3  . insulin lispro (HUMALOG KWIKPEN) 100 UNIT/ML KiwkPen Take 15 U of Humalog with breakfast and lunch, and 10 U with dinner 45 mL 3  . Insulin Pen Needle 31G X 8 MM MISC Inject 1 each into the skin 3 (three) times daily. Check blood sugar TID and QHS 100 each 3  . metoCLOPramide (REGLAN) 10 MG tablet TAKE 1 TABLET BY MOUTH 3 TIMES DAILY BEFORE MEALS AS NEEDED 90 tablet 0  . metoprolol (LOPRESSOR) 50 MG tablet Take 1 tablet (50 mg total) by mouth 2 (two) times daily. 60 tablet 5  . nortriptyline (PAMELOR) 10 MG capsule Take 1 capsule (10 mg total) by mouth at bedtime. 30 capsule 2  . pantoprazole (PROTONIX) 40 MG tablet Take 1 tablet (40 mg total) by mouth 2 (two) times daily. 60 tablet 5  . promethazine (PHENERGAN) 25 MG tablet TAKE 1 TABLET BY MOUTH EVERY 8 HOURS AS NEEDED FOR NAUSEA OR VOMITING. 60 tablet 0  . sucralfate (CARAFATE) 1 g tablet Take 1 tablet (1 g total) by mouth 4 (four) times daily -  with meals and at bedtime. 120 tablet 5  . tadalafil 2.5 MG TABS Take 1 tablet (2.5 mg total) by mouth daily. 30 tablet 5  . terbinafine (LAMISIL) 250 MG tablet Take 1 tablet (250 mg total) by mouth daily. 30 tablet 2  . TRUEPLUS LANCETS 28G MISC 1 each by Does not apply route 4 (four) times daily -  before meals and at bedtime. 100 each 12   No facility-administered medications prior to visit.     ROS Review of Systems  Constitutional: Negative for chills, fatigue, fever and unexpected weight change.  Eyes: Positive for visual disturbance.  Respiratory: Negative for cough and shortness of breath.    Cardiovascular: Negative for chest pain, palpitations and leg swelling.  Gastrointestinal: Positive for diarrhea. Negative for abdominal pain, blood in stool, constipation, nausea and vomiting.  Endocrine: Negative for polydipsia, polyphagia and polyuria.  Musculoskeletal: Positive for arthralgias and joint swelling. Negative for back pain, gait problem, myalgias and neck pain.  Skin: Negative for rash.  Allergic/Immunologic: Negative for immunocompromised state.  Hematological: Negative for adenopathy. Does not bruise/bleed easily.  Psychiatric/Behavioral: Positive for dysphoric mood and sleep disturbance. Negative for suicidal ideas. The patient is nervous/anxious.     Objective:  BP (!) 140/100   Pulse 87   Temp 97.9 F (36.6 C) (Oral)   Ht 6' 3" (1.905 m)   Wt (!) 338 lb (153.3 kg)   SpO2 98%   BMI 42.25 kg/m  BP/Weight 06/01/2016 03/30/2016 60/73/7106  Systolic BP 269 485 462  Diastolic BP 703 99 87  Wt. (Lbs) 338 326 314.8  BMI 42.25 40.75 39.35    Physical Exam  Constitutional: He appears well-developed and well-nourished. No distress.  HENT:  Head: Normocephalic and atraumatic.  Neck: Normal range of motion. Neck supple.  Cardiovascular: Normal rate, regular rhythm, normal heart sounds and intact distal pulses.   Pulmonary/Chest: Effort normal and breath sounds normal.  Abdominal: Soft. Bowel sounds are normal. He exhibits distension (slight distension ). He exhibits no mass. There is no tenderness. There is no rebound and no guarding.  Musculoskeletal: He exhibits edema (b/l trace ).  Neurological: He is alert.  Skin: Skin is warm and dry. No rash noted. No erythema.     Psychiatric: He has a normal mood and affect.    Lab Results  Component Value Date   HGBA1C 10.2 03/30/2016   CBG 405   Treated with 10 U of novolog UA: 500 glucose, moderate blood, > 300 protein   Repeat CBG 404  Depression screen [facility] 2/9 03/30/2016 12/23/2015 04/22/2015  Decreased  Interest _0 Down, Depressed, Hopeless _1 PHQ - 2 Score _2 Altered sleeping _3 Tired, decreased energy _4 Change in appetite _5 Feeling bad or failure about yourself  _6 Trouble concentrating _7 Moving slowly or fidgety/restless _8 Suicidal thoughts _9 PHQ-9 Score _10 GAD 7 : Generalized Anxiety Score 03/30/2016 12/23/2015  Nervous, Anxious, on Edge 1 1  Control/stop worrying 2 3  Worry too much - different things 3 3  Trouble relaxing 2 2  Restless 1 0  Easily annoyed or irritable 2 2  Afraid - awful might happen 2 1  Total GAD 7 Score 13 12      Assessment & Plan:   Duane [name] was seen today for diabetes.  Diagnoses and all orders for this visit:  Type 1 diabetes mellitus with diabetic polyneuropathy (HCC) -     POCT glucose (manual entry) -     POCT glycosylated hemoglobin (Hb A1C) -     Microalbumin / creatinine urine ratio -     insulin aspart (novoLOG) injection 20 Units; Inject 0.2 mLs (20 Units total) into the skin once. -     POCT urinalysis dipstick -     Insulin Glargine (LANTUS SOLOSTAR) 100 UNIT/ML Solostar Pen; Inject 45 Units into the skin at bedtime. -     insulin lispro (HUMALOG KWIKPEN) 100 UNIT/ML KiwkPen; Inject 0.15 mLs (15 Units total) into the skin 3 (three) times daily. -     TSH -     cloNIDine (CATAPRES) tablet 0.2 mg; Take 2 tablets (0.2 mg total) by mouth once. -     POCT glucose (manual entry)  Hematuria, unspecified type -     Urine culture -     Urine cytology ancillary only -     cephALEXin (KEFLEX) 500 MG capsule; Take 1 capsule (500 mg total) by mouth 4 (four) times daily.  Leg swelling -     Brain natriuretic peptide -     lisinopril-hydrochlorothiazide (ZESTORETIC) 20-12.5 MG tablet; Take 2 tablets by mouth daily. -     Discontinue: furosemide (LASIX) 40 MG tablet; Take 1 tablet (40 mg total) by mouth daily. -     furosemide (LASIX) 40 MG  tablet; Take 1 tablet (40 mg total) by mouth daily.  For 5 days  Chronic diarrhea -     ondansetron (ZOFRAN) 8 MG tablet; Take 1 tablet (8 mg total) by mouth at bedtime. -     POCT occult blood stool -     DG Abd 1 View; Future  RUQ pain -     CT Abdomen Pelvis Wo Contrast; Future    No orders of the defined types were placed in this encounter.   Follow-up: Return in about 2 weeks (around 06/15/2016) for HTN, DM2, diarrhea, RUQ pain .   Boykin Nearing MD

## 2016-06-01 NOTE — Progress Notes (Signed)
Pt is still having swelling in feet and legs. Pt is still having diarrhea.

## 2016-06-01 NOTE — Patient Instructions (Addendum)
Darus was seen today for diabetes.  Diagnoses and all orders for this visit:  Type 1 diabetes mellitus with diabetic polyneuropathy (HCC) -     POCT glucose (manual entry) -     POCT glycosylated hemoglobin (Hb A1C) -     Microalbumin / creatinine urine ratio -     insulin aspart (novoLOG) injection 20 Units; Inject 0.2 mLs (20 Units total) into the skin once. -     POCT urinalysis dipstick -     Insulin Glargine (LANTUS SOLOSTAR) 100 UNIT/ML Solostar Pen; Inject 45 Units into the skin at bedtime. -     insulin lispro (HUMALOG KWIKPEN) 100 UNIT/ML KiwkPen; Inject 0.15 mLs (15 Units total) into the skin 3 (three) times daily. -     TSH  Hematuria, unspecified type -     Urine culture -     Urine cytology ancillary only  Leg swelling -     Brain natriuretic peptide -     lisinopril-hydrochlorothiazide (ZESTORETIC) 20-12.5 MG tablet; Take 2 tablets by mouth daily. -     Discontinue: furosemide (LASIX) 40 MG tablet; Take 1 tablet (40 mg total) by mouth daily. -     furosemide (LASIX) 40 MG tablet; Take 1 tablet (40 mg total) by mouth daily. For 5 days  Chronic diarrhea -     ondansetron (ZOFRAN) 8 MG tablet; Take 1 tablet (8 mg total) by mouth at bedtime. -     POCT occult blood stool -     DG Abd 1 View; Future  RUQ pain -     CT Abdomen Pelvis Wo Contrast; Future   Keflex for possible UTI  Increase lantus to 45 U and novolog or humalog to 15 U with meals  Be sure to take wellbutrin daily Start pamelor for depression, insomnia and hopefully to help reduce diarrhea  Also start zofran nightly for diarrhea  Taper off metoprolol 50 mg twice daily 25 mg twice daily for one week, then every other day for one week then STOP  Start prinzide for BP reduction Take lasix 40 mg daily for 5 days to remove fluid then stop Keep salt intake low   Stool card is negative for blood, there was some firm stool in rectal vault. There is a chance there is stool blocking complete emptying and  allowing only soft stool to pass. This is call encopresis If this is the case then we actually need to induce bowel movements with laxatives.   Plan x-ray of abdomen to assess for stool burden  CT to assess for hernia  Return in 2 weeks for HTN, diabetes, chronic diarrhea and depression, 30 minute visit please  Dr. Adrian Blackwater

## 2016-06-02 LAB — BRAIN NATRIURETIC PEPTIDE: BNP: 5.4 pg/mL (ref 0.0–100.0)

## 2016-06-02 LAB — TSH: TSH: 3.51 u[IU]/mL (ref 0.450–4.500)

## 2016-06-02 LAB — MICROALBUMIN / CREATININE URINE RATIO
CREATININE, UR: 107.1 mg/dL
Microalb/Creat Ratio: 788.3 mg/g creat — ABNORMAL HIGH (ref 0.0–30.0)
Microalbumin, Urine: 844.3 ug/mL

## 2016-06-03 LAB — URINE CULTURE: ORGANISM ID, BACTERIA: NO GROWTH

## 2016-06-03 NOTE — Assessment & Plan Note (Addendum)
A: major depression with insomnia, intermittent use of Wellbutrin he has not tried nortriptyline  P: advised patient taking Wellbutrin daily Add low dose nortriptyline to help with insomnia

## 2016-06-03 NOTE — Assessment & Plan Note (Signed)
A: uncontrolled HTN with uncontrolled IDDM type 1 P: Taper off metoprolol Ad lisinopril-HCTZ 20-12.5 mg 2 tabs daily Short course of lasix to help with leg swelling

## 2016-06-03 NOTE — Assessment & Plan Note (Signed)
Chronic C. Diff negative diarrhea with heme negative stools. He has gastroparesis Lomotil and questran have not helped with diarrhea There is possible encopresis   Plan: Plain film If there is a stool burden stop all antidiarrheals, start miralax for bowel clean

## 2016-06-03 NOTE — Assessment & Plan Note (Signed)
A: diabetes type 1 uncontrolled with hyperglycemia P: Increase Humalog to 15 U TID increase lantus to 45 U daily

## 2016-06-04 LAB — URINE CYTOLOGY ANCILLARY ONLY
Chlamydia: NEGATIVE
NEISSERIA GONORRHEA: NEGATIVE
TRICH (WINDOWPATH): NEGATIVE

## 2016-06-05 ENCOUNTER — Telehealth: Payer: Self-pay

## 2016-06-05 DIAGNOSIS — R319 Hematuria, unspecified: Secondary | ICD-10-CM

## 2016-06-05 NOTE — Telephone Encounter (Signed)
For blood in urine without infection the most common cause is stone The CT scan will give more information I did place a urology referral He should increase intake of fluids He should stop the antibiotic, keflex

## 2016-06-05 NOTE — Telephone Encounter (Signed)
Pt was called and informed of lab results. Pt states that he is having blood in his urine and what to know why this is happening. Pt also wants to know if he should still take the antibiotic.

## 2016-06-06 ENCOUNTER — Telehealth: Payer: Self-pay

## 2016-06-06 NOTE — Telephone Encounter (Signed)
Pt was called and informed to stop Keflex and to keep CT scan appointment. Pt also aware of referral being placed.

## 2016-06-07 ENCOUNTER — Ambulatory Visit (HOSPITAL_COMMUNITY): Payer: Self-pay

## 2016-06-13 ENCOUNTER — Other Ambulatory Visit: Payer: Self-pay | Admitting: Family Medicine

## 2016-06-13 MED FILL — SUCRALFATE 1 GM TABLET: 1 | 30 days supply | Qty: 120 | Fill #1

## 2016-06-13 MED FILL — NORTRIPTYLINE HCL 10 MG CAP: 10 | 30 days supply | Qty: 30 | Fill #2

## 2016-06-13 MED FILL — GABAPENTIN 300 MG CAPSULE: 300 | 30 days supply | Qty: 30 | Fill #2

## 2016-06-13 MED FILL — PANTOPRAZOLE SOD DR 40 MG T: 40 | 30 days supply | Qty: 60 | Fill #2

## 2016-06-15 ENCOUNTER — Encounter: Payer: Self-pay | Admitting: Family Medicine

## 2016-06-15 ENCOUNTER — Ambulatory Visit: Payer: Self-pay | Attending: Family Medicine | Admitting: Family Medicine

## 2016-06-15 VITALS — BP 140/95 | HR 102 | Temp 98.0°F | Ht 75.0 in | Wt 334.8 lb

## 2016-06-15 DIAGNOSIS — R319 Hematuria, unspecified: Secondary | ICD-10-CM | POA: Insufficient documentation

## 2016-06-15 DIAGNOSIS — Z79899 Other long term (current) drug therapy: Secondary | ICD-10-CM | POA: Insufficient documentation

## 2016-06-15 DIAGNOSIS — I10 Essential (primary) hypertension: Secondary | ICD-10-CM | POA: Insufficient documentation

## 2016-06-15 DIAGNOSIS — Z87442 Personal history of urinary calculi: Secondary | ICD-10-CM | POA: Insufficient documentation

## 2016-06-15 DIAGNOSIS — E1065 Type 1 diabetes mellitus with hyperglycemia: Secondary | ICD-10-CM | POA: Insufficient documentation

## 2016-06-15 DIAGNOSIS — E1042 Type 1 diabetes mellitus with diabetic polyneuropathy: Secondary | ICD-10-CM | POA: Insufficient documentation

## 2016-06-15 DIAGNOSIS — F329 Major depressive disorder, single episode, unspecified: Secondary | ICD-10-CM | POA: Insufficient documentation

## 2016-06-15 DIAGNOSIS — Z794 Long term (current) use of insulin: Secondary | ICD-10-CM | POA: Insufficient documentation

## 2016-06-15 DIAGNOSIS — K529 Noninfective gastroenteritis and colitis, unspecified: Secondary | ICD-10-CM | POA: Insufficient documentation

## 2016-06-15 DIAGNOSIS — S81801A Unspecified open wound, right lower leg, initial encounter: Secondary | ICD-10-CM | POA: Insufficient documentation

## 2016-06-15 DIAGNOSIS — Z9049 Acquired absence of other specified parts of digestive tract: Secondary | ICD-10-CM | POA: Insufficient documentation

## 2016-06-15 DIAGNOSIS — E1043 Type 1 diabetes mellitus with diabetic autonomic (poly)neuropathy: Secondary | ICD-10-CM | POA: Insufficient documentation

## 2016-06-15 DIAGNOSIS — K3184 Gastroparesis: Secondary | ICD-10-CM | POA: Insufficient documentation

## 2016-06-15 DIAGNOSIS — M7989 Other specified soft tissue disorders: Secondary | ICD-10-CM

## 2016-06-15 LAB — GLUCOSE, POCT (MANUAL RESULT ENTRY)
POC GLUCOSE: 293 mg/dL — AB (ref 70–99)
POC Glucose: 263 mg/dl — AB (ref 70–99)

## 2016-06-15 MED ORDER — SULFAMETHOXAZOLE-TRIMETHOPRIM 800-160 MG PO TABS: 1.0000 | ORAL_TABLET | Freq: Two times a day (BID) | ORAL | 0 refills | Status: DC

## 2016-06-15 MED ORDER — FUROSEMIDE 40 MG PO TABS: 40.0000 mg | ORAL_TABLET | Freq: Every day | ORAL | 2 refills | Status: DC

## 2016-06-15 MED ORDER — CHOLESTYRAMINE 4 G PO PACK: 4.0000 g | PACK | Freq: Three times a day (TID) | ORAL | 12 refills | Status: DC

## 2016-06-15 MED ORDER — INSULIN ASPART 100 UNIT/ML ~~LOC~~ SOLN
10.0000 [IU] | Freq: Once | SUBCUTANEOUS | Status: AC
  Administered 2016-06-15: 10 [IU] via SUBCUTANEOUS

## 2016-06-15 MED ORDER — INSULIN PEN NEEDLE 31G X 8 MM MISC: 1.0000 | Freq: Four times a day (QID) | 11 refills | Status: AC

## 2016-06-15 MED FILL — METOCLOPRAMIDE 10 MG TABLET: 10 | 30 days supply | Qty: 90 | Fill #0

## 2016-06-15 MED FILL — CHOLESTYRAMINE PACKET: 4 | 20 days supply | Qty: 60 | Fill #0

## 2016-06-15 NOTE — Progress Notes (Signed)
Subjective:  Patient ID: Duane [name], male    DOB: 1975-06-04  Age: 41 y.o. MRN: [id number]  CC: Diabetes and Hypertension   HPI Duane [name] has depression, diabetes type 1 uncontrolled with diabetic gastroparesis ( delayed gastric emptying study 08/2013) and diabetic neuropathy , HTN,  chronic diarrhea Duane [name] presents for    1. Diarrhea: x  2 years. C. Diff negative in 12/2014. Diarrhea usually in early mornings. Watery to soft bowel movements. Last 4-5 hrs up to all day. No identified food triggers. Sometimes cheeses or dairy items cause pain. Has hx of cholecystectomy. Diarrhea is associated with gas pain. Bloating. No fever. No blood in stool. Denies constipation. Has IDDM type 1 with gastroparesis. ? IBS. No family history of IBS/IBD or lactose intolerance. Duane [name] has decreased gas producing foods and lactose from his diet. This has not helped him much. Duane [name] has darks stools at times, brown or green. No frank blood.   Duane [name] is s/p cholecystectomy. Duane [name] is taking [place]. Duane [name] tried [place] and lomotil without improvement. GI referral was already placed. His medicare will start in 08/2016.  Duane [name] has diarrhea 5 days of the week. Diarrhea is worse in early AM between 3-5 AM. Duane [name] has excessive gas. Duane [name] has bloating. Duane [name] reports pain in his right upper quadrant for the past 2 weeks induced by abdominal crunching.   DG abdomen 01/01/15 FINDINGS: No bowel dilation is seen to suggest obstruction. There is no adynamic ileus or free air. Mild colonic stool burden is noted.  Status post cholecystectomy, stable. No evidence of renal or ureteral stones. Bony structures are unremarkable.  IMPRESSION: 1. No acute findings. No evidence of bowel obstruction, generalized adynamic ileus or free air  2. CHRONIC DIABETES  Disease Monitoring  Blood Sugar Ranges:   Fasting: fasting   Post prandial: 300-400   Polyuria: no   Visual problems: yes   Medication Compliance: yes, taking lantus 35 U daily, Humalog 9-15  with meals  Medication Side Effects  Hypoglycemia: no   Preventitive Health Care  Eye Exam: due   Foot Exam: done today   Diet pattern: eats irregular meals, eating 1-2 meals a day.   Exercise: yes    2. Dysuria:  Resolved. Duane [name] did pass a kidney stone about 1 week ago. Duane [name] reports also having blood in his urine. Has not seen any since then.   3. HTN: Duane [name] reports legs swelling improved with lasix. Swelling in R foot and ankle area mostly. Duane [name] is taking prinzide 20-12.5 mg BID.    4. R leg wound: x one week. Reports scarping his leg. Mild redness. Minimal pain. No treatment.    Social History  Substance Use Topics  . Smoking status: Never Smoker  . Smokeless tobacco: Never Used  . Alcohol use No    Outpatient Medications Prior to Visit  Medication Sig Dispense Refill  . Blood Glucose Monitoring Suppl (TRUE METRIX METER) W/DEVICE KIT 1 each by Does not apply route 4 (four) times daily -  before meals and at bedtime. 1 kit ea  . buPROPion (WELLBUTRIN XL) 150 MG 24 hr tablet Take 1 tablet (150 mg total) by mouth daily. 30 tablet 3  . cholestyramine (QUESTRAN) 4 g packet Take 1 packet (4 g total) by mouth 3 (three) times daily with meals. 90 each 12  . famotidine (PEPCID) 20 MG tablet Take 1 tablet (20 mg total) by mouth 2 (two) times daily. 30 tablet 0  . furosemide (LASIX) 40 MG tablet Take 1 tablet (  40 mg total) by mouth daily. For 5 days 5 tablet 0  . gabapentin (NEURONTIN) 300 MG capsule Take 1 capsule (300 mg total) by mouth at bedtime. 30 capsule 3  . glucose blood test strip Use three times daily 100 each 12  . insulin aspart (NOVOLOG) 100 UNIT/ML FlexPen Inject 10 Units into the skin 3 (three) times daily with meals. 15 mL 3  . Insulin Glargine (LANTUS SOLOSTAR) 100 UNIT/ML Solostar Pen Inject 45 Units into the skin at bedtime. 90 mL 3  . insulin lispro (HUMALOG KWIKPEN) 100 UNIT/ML KiwkPen Inject 0.15 mLs (15 Units total) into the skin 3 (three) times daily. 45 mL 3  . Insulin  Pen Needle 31G X 8 MM MISC Inject 1 each into the skin 3 (three) times daily. Check blood sugar TID and QHS 100 each 3  . lisinopril-hydrochlorothiazide (ZESTORETIC) 20-12.5 MG tablet Take 2 tablets by mouth daily. 60 tablet 3  . metoCLOPramide (REGLAN) 10 MG tablet TAKE 1 TABLET BY MOUTH 3 TIMES DAILY BEFORE MEALS AS NEEDED 90 tablet 0  . nortriptyline (PAMELOR) 10 MG capsule Take 1 capsule (10 mg total) by mouth at bedtime. 30 capsule 2  . ondansetron (ZOFRAN) 8 MG tablet Take 1 tablet (8 mg total) by mouth at bedtime. 30 tablet 0  . pantoprazole (PROTONIX) 40 MG tablet Take 1 tablet (40 mg total) by mouth 2 (two) times daily. 60 tablet 5  . promethazine (PHENERGAN) 25 MG tablet TAKE 1 TABLET BY MOUTH EVERY 8 HOURS AS NEEDED FOR NAUSEA OR VOMITING. 60 tablet 0  . sucralfate (CARAFATE) 1 g tablet Take 1 tablet (1 g total) by mouth 4 (four) times daily -  with meals and at bedtime. 120 tablet 5  . tadalafil 2.5 MG TABS Take 1 tablet (2.5 mg total) by mouth daily. 30 tablet 5  . terbinafine (LAMISIL) 250 MG tablet Take 1 tablet (250 mg total) by mouth daily. 30 tablet 2  . TRUEPLUS LANCETS 28G MISC 1 each by Does not apply route 4 (four) times daily -  before meals and at bedtime. 100 each 12  . cephALEXin (KEFLEX) 500 MG capsule Take 1 capsule (500 mg total) by mouth 4 (four) times daily. (Patient not taking: Reported on 06/15/2016) 28 capsule 0   No facility-administered medications prior to visit.     ROS Review of Systems  Constitutional: Negative for chills, fatigue, fever and unexpected weight change.  Eyes: Positive for visual disturbance.  Respiratory: Negative for cough and shortness of breath.   Cardiovascular: Negative for chest pain, palpitations and leg swelling.  Gastrointestinal: Positive for diarrhea. Negative for abdominal pain, blood in stool, constipation, nausea and vomiting.  Endocrine: Positive for heat intolerance. Negative for polydipsia, polyphagia and polyuria.    Musculoskeletal: Positive for arthralgias, joint swelling and myalgias (myalgia in thighs, flanks and abdomen). Negative for back pain, gait problem and neck pain.  Skin: Negative for rash.  Allergic/Immunologic: Negative for immunocompromised state.  Hematological: Negative for adenopathy. Does not bruise/bleed easily.  Psychiatric/Behavioral: Positive for dysphoric mood and sleep disturbance. Negative for suicidal ideas. The patient is nervous/anxious.     Objective:  BP (!) 140/95   Pulse (!) 102   Temp 98 F (36.7 C) (Oral)   Ht 6' 3"  (1.905 m)   Wt (!) 334 lb 12.8 oz (151.9 kg)   SpO2 99%   BMI 41.85 kg/m   BP/Weight 06/15/2016 06/01/2016 06/06/9561  Systolic BP 875 643 329  Diastolic BP 95 98 99  Wt. (Lbs) 334.8 338 326  BMI 41.85 42.25 40.75   Wt Readings from Last 3 Encounters:  06/15/16 (!) 334 lb 12.8 oz (151.9 kg)  06/01/16 (!) 338 lb (153.3 kg)  03/30/16 (!) 326 lb (147.9 kg)    Physical Exam  Constitutional: Duane [name] appears well-developed and well-nourished. No distress.  HENT:  Head: Normocephalic and atraumatic.  Neck: Normal range of motion. Neck supple.  Cardiovascular: Normal rate, regular rhythm, normal heart sounds and intact distal pulses.   Pulmonary/Chest: Effort normal and breath sounds normal.  Abdominal: Soft. Bowel sounds are normal. Duane [name] exhibits distension (slight distension ). Duane [name] exhibits no mass. There is no tenderness. There is no rebound and no guarding.  Musculoskeletal: Duane [name] exhibits edema (b/l trace ).  Neurological: Duane [name] is alert.  Skin: Skin is warm and dry. No rash noted. No erythema.     Psychiatric: Duane [name] has a normal mood and affect.    Lab Results  Component Value Date   HGBA1C 11.4 06/01/2016   CBG 293 Treated with 10 U of novolog Repeat CBG 263   Depression screen [facility] 2/9 03/30/2016 12/23/2015 04/22/2015  Decreased Interest 2 1 2   Down, Depressed, Hopeless 3 1 3   PHQ - 2 Score 5 2 5   Altered sleeping 3 3 3   Tired, decreased energy  2 2 2   Change in appetite 2 3 2   Feeling bad or failure about yourself  1 2 2   Trouble concentrating 1 1 2   Moving slowly or fidgety/restless 1 1 2   Suicidal thoughts 1 1 1   PHQ-9 Score 16 15 19    GAD 7 : Generalized Anxiety Score 06/15/2016 03/30/2016 12/23/2015  Nervous, Anxious, on Edge 1 1 1   Control/stop worrying 3 2 3   Worry too much - different things 2 3 3   Trouble relaxing 2 2 2   Restless 2 1 0  Easily annoyed or irritable 1 2 2   Afraid - awful might happen 2 2 1   Total GAD 7 Score 13 13 12       Assessment & Plan:  Duane [name] was seen today for diabetes and hypertension.  Diagnoses and all orders for this visit:  Chronic diarrhea -     cholestyramine (QUESTRAN) 4 g packet; Take 1 packet (4 g total) by mouth 3 (three) times daily with meals. -     Cdiff NAA+O+P+Stool Culture; Future  Leg swelling -     furosemide (LASIX) 40 MG tablet; Take 1 tablet (40 mg total) by mouth daily. -     Cancel: BASIC METABOLIC PANEL WITH GFR -     Basic Metabolic Panel  Type 1 diabetes mellitus with diabetic polyneuropathy (HCC) -     POCT glucose (manual entry) -     Insulin Pen Needle 31G X 8 MM MISC; Inject 1 each into the skin 4 (four) times daily. Check blood sugar TID and QHS -     insulin aspart (novoLOG) injection 10 Units; Inject 0.1 mLs (10 Units total) into the skin once. -     POCT glucose (manual entry)  Open wound of right lower leg, initial encounter -     sulfamethoxazole-trimethoprim (BACTRIM DS) 800-160 MG tablet; Take 1 tablet by mouth 2 (two) times daily.   There are no diagnoses linked to this encounter.  No orders of the defined types were placed in this encounter.   Follow-up: Return in about 3 weeks (around 07/06/2016) for red leg abrasion .   Boykin Nearing MD

## 2016-06-15 NOTE — Patient Instructions (Addendum)
Duane [name] was seen today for diabetes and hypertension.  Diagnoses and all orders for this visit:  Chronic diarrhea -     cholestyramine (QUESTRAN) 4 g packet; Take 1 packet (4 g total) by mouth 3 (three) times daily with meals. -     Cdiff NAA+O+P+Stool Culture; Future  Leg swelling -     furosemide (LASIX) 40 MG tablet; Take 1 tablet (40 mg total) by mouth daily. -     BASIC METABOLIC PANEL WITH GFR  Type 1 diabetes mellitus with diabetic polyneuropathy (HCC) -     POCT glucose (manual entry) -     Insulin Pen Needle 31G X 8 MM MISC; Inject 1 each into the skin 4 (four) times daily. Check blood sugar TID and QHS -     insulin aspart (novoLOG) injection 10 Units; Inject 0.1 mLs (10 Units total) into the skin once.  Open wound of right lower leg, initial encounter -     sulfamethoxazole-trimethoprim (BACTRIM DS) 800-160 MG tablet; Take 1 tablet by mouth 2 (two) times daily.  increase lantus to 45 U and Humalog to 15 U with meals  Start taking lasix 40 mg daily Checking potassium today as lasix dose deplete potassium   Please complete x-ray of abdomen, you will be called with results   Keep compression on R leg Keep dressing on, reapply as needed  Return stool sample for culture at your earliest convenience   f/u in 3 weeks to check right leg   Dr. Adrian Blackwater

## 2016-06-15 NOTE — Assessment & Plan Note (Signed)
A: wound on leg from abrasion P: Clean and dressed today Bactrim ordered

## 2016-06-15 NOTE — Assessment & Plan Note (Addendum)
A: HTN and leg swellingm tachycardia (once tapered off metoprolol),renal insufficiency GFR of 57 Cr is 1.72 P: Continue prinzide 40-25 mg daily, continue lasix but decrease to 20 mg daily for swelling and BP, add diltiazem 180 mg daily for BP control and heart rate reduction  patient informed that controlling BP, sugar and avoiding dehydration is important to kidney health. I also recommended not taking OTC NSAIDs ( ibuprofen, aleve)

## 2016-06-15 NOTE — Assessment & Plan Note (Signed)
A: chronic diarrhea, encopresis vs IBS, r/o infectious (unlikely)  p: Abdominal x-ray Stool studies

## 2016-06-15 NOTE — Assessment & Plan Note (Signed)
A: uncontrolled Non compliant with prescribed regimen P: lantus 45 U  Humalog 15 U TID

## 2016-06-16 LAB — BASIC METABOLIC PANEL
BUN/Creatinine Ratio: 12 (ref 9–20)
BUN: 20 mg/dL (ref 6–20)
CALCIUM: 9.9 mg/dL (ref 8.7–10.2)
CHLORIDE: 98 mmol/L (ref 96–106)
CO2: 24 mmol/L (ref 18–29)
Creatinine, Ser: 1.72 mg/dL — ABNORMAL HIGH (ref 0.76–1.27)
GFR calc non Af Amer: 49 mL/min/{1.73_m2} — ABNORMAL LOW (ref >59)
GFR, EST AFRICAN AMERICAN: 57 mL/min/{1.73_m2} — AB (ref >59)
Glucose: 269 mg/dL — ABNORMAL HIGH (ref 65–99)
Potassium: 4.7 mmol/L (ref 3.5–5.2)
Sodium: 139 mmol/L (ref 134–144)

## 2016-06-18 MED ORDER — DILTIAZEM HCL ER COATED BEADS 180 MG PO CP24: 180.0000 mg | ORAL_CAPSULE | Freq: Every day | ORAL | 5 refills | Status: DC

## 2016-06-18 MED ORDER — FUROSEMIDE 20 MG PO TABS: 20.0000 mg | ORAL_TABLET | Freq: Every day | ORAL | 2 refills | Status: DC

## 2016-06-18 MED FILL — DILTIAZEM 24HR ER 180 MG CA: 180 | 30 days supply | Qty: 30 | Fill #0

## 2016-06-18 MED FILL — FUROSEMIDE 20 MG TABLET: 20 | 30 days supply | Qty: 30 | Fill #0

## 2016-06-18 NOTE — Addendum Note (Signed)
Addended by: Boykin Nearing on: 06/18/2016 08:41 AM   Modules accepted: Orders

## 2016-06-27 ENCOUNTER — Telehealth: Payer: Self-pay

## 2016-06-27 ENCOUNTER — Encounter: Payer: Self-pay | Admitting: Family Medicine

## 2016-06-27 NOTE — Telephone Encounter (Signed)
Pt was called and informed of lab results. 

## 2016-07-05 MED FILL — LISINOPRIL-HCTZ 20-12.5 MG: 20-12.5 | 30 days supply | Qty: 60 | Fill #1

## 2016-07-10 ENCOUNTER — Encounter: Payer: Self-pay | Admitting: Family Medicine

## 2016-07-10 ENCOUNTER — Ambulatory Visit: Payer: Self-pay | Attending: Family Medicine | Admitting: Family Medicine

## 2016-07-10 VITALS — BP 141/89 | HR 94 | Temp 98.1°F | Wt 340.4 lb

## 2016-07-10 DIAGNOSIS — Z79899 Other long term (current) drug therapy: Secondary | ICD-10-CM | POA: Insufficient documentation

## 2016-07-10 DIAGNOSIS — Z794 Long term (current) use of insulin: Secondary | ICD-10-CM | POA: Insufficient documentation

## 2016-07-10 DIAGNOSIS — E1043 Type 1 diabetes mellitus with diabetic autonomic (poly)neuropathy: Secondary | ICD-10-CM

## 2016-07-10 DIAGNOSIS — I10 Essential (primary) hypertension: Secondary | ICD-10-CM

## 2016-07-10 DIAGNOSIS — E0843 Diabetes mellitus due to underlying condition with diabetic autonomic (poly)neuropathy: Secondary | ICD-10-CM

## 2016-07-10 DIAGNOSIS — K529 Noninfective gastroenteritis and colitis, unspecified: Secondary | ICD-10-CM | POA: Insufficient documentation

## 2016-07-10 DIAGNOSIS — S81801D Unspecified open wound, right lower leg, subsequent encounter: Secondary | ICD-10-CM

## 2016-07-10 DIAGNOSIS — E1042 Type 1 diabetes mellitus with diabetic polyneuropathy: Secondary | ICD-10-CM | POA: Insufficient documentation

## 2016-07-10 DIAGNOSIS — F329 Major depressive disorder, single episode, unspecified: Secondary | ICD-10-CM | POA: Insufficient documentation

## 2016-07-10 DIAGNOSIS — K3184 Gastroparesis: Secondary | ICD-10-CM | POA: Insufficient documentation

## 2016-07-10 DIAGNOSIS — R600 Localized edema: Secondary | ICD-10-CM | POA: Insufficient documentation

## 2016-07-10 DIAGNOSIS — F322 Major depressive disorder, single episode, severe without psychotic features: Secondary | ICD-10-CM

## 2016-07-10 LAB — POCT GLYCOSYLATED HEMOGLOBIN (HGB A1C): Hemoglobin A1C: 11.4

## 2016-07-10 LAB — GLUCOSE, POCT (MANUAL RESULT ENTRY): POC GLUCOSE: 152 mg/dL — AB (ref 70–99)

## 2016-07-10 MED ORDER — GLUCOSE BLOOD VI STRP: 1.0000 | ORAL_STRIP | Freq: Three times a day (TID) | 11 refills | Status: DC

## 2016-07-10 MED ORDER — GABAPENTIN 300 MG PO CAPS: 300.0000 mg | ORAL_CAPSULE | Freq: Every day | ORAL | 3 refills | Status: DC

## 2016-07-10 MED ORDER — NORTRIPTYLINE HCL 25 MG PO CAPS: ORAL_CAPSULE | ORAL | 2 refills | Status: DC

## 2016-07-10 MED FILL — TRUE METRIX TEST STRIP: 30 days supply | Qty: 100 | Fill #0

## 2016-07-10 MED FILL — GABAPENTIN 300 MG CAPSULE: 300 | 30 days supply | Qty: 30 | Fill #0

## 2016-07-10 MED FILL — NORTRIPTYLINE HCL 25 MG CAP: 25 | 30 days supply | Qty: 60 | Fill #0

## 2016-07-10 NOTE — Progress Notes (Signed)
Subjective:  Patient ID: Duane [name], male    DOB: [birth year]  Age: 41 y.o. MRN: [id number]  CC: Follow-up   HPI Duane [name] has depression, diabetes type 1 uncontrolled with diabetic gastroparesis ( delayed gastric emptying study 08/2013) and diabetic neuropathy , HTN,  chronic diarrhea he presents for    1. Diarrhea: started in 06/2014 after acute episode attributed to gastroenteritis. Symptoms persisted. C. Diff negative in 12/2014. Diarrhea usually in early mornings. Watery to soft bowel movements. Last 4-5 hrs up to all day. No identified food triggers. Sometimes cheeses or dairy items cause pain. Has hx of cholecystectomy. Diarrhea is associated with gas pain. Bloating. No fever. No blood in stool. Denies constipation. Has IDDM type 1 with gastroparesis. ? IBS. No family history of IBS/IBD or lactose intolerance. He has decreased gas producing foods and lactose from his diet. This has not helped him much. He has darks stools at times, brown or green. No frank blood.   He is s/p cholecystectomy. He is taking [place]. He tried [place] and lomotil without improvement.  His medicare will start in 08/2016.  He has diarrhea 5 days of the week. Diarrhea is worse in early AM between 3-5 AM.   He has long history of gastroparesis and has seen GI and had workup in the past. The patient has had a gastric emptying study on 08/18/2013 which revealed 100% retention of food products at 120 minutes. He had an EGD performed on 06/11/2013 which showed a hiatus hernia, but was normal otherwise. A flexible sigmoidoscopy was performed on 06/11/2013 which was normal up to the splenic flexure. He continues to take reglan 10 mg TID, phenergan, carafate and protonix.   DG abdomen 01/01/15 FINDINGS: No bowel dilation is seen to suggest obstruction. There is no adynamic ileus or free air. Mild colonic stool burden is noted.  Status post cholecystectomy, stable. No evidence of renal or ureteral stones. Bony  structures are unremarkable.  IMPRESSION: 1. No acute findings. No evidence of bowel obstruction, generalized adynamic ileus or free air  2. CHRONIC DIABETES  Disease Monitoring  Blood Sugar Ranges:   Fasting: 140-190s  Post prandial: high to mid 300s  Polyuria: no   Visual problems: yes   Medication Compliance: yes, taking lantus 45 U daily, Humalog 15 with meals  Medication Side Effects  Hypoglycemia: yes 60-70s a few days ago, felt jittery.   Preventitive Health Care  Eye Exam: due   Foot Exam: done today   Diet pattern: eats irregular meals, eating 1-2 meals a day.   Exercise: yes     3. HTN: he reports legs swelling improved with lasix 40 mg daily. Swelling in R foot and ankle area mostly. He is taking prinzide 40-25 mg daily. Diltiazem 180 mg daily. Lasix 20 mg daily. Leg swelling is exacerbated by standing for prolonged periods. He wears compression stockings when he works.     4. R leg wound: erythematous would with scant purulent drainage following scarping his leg. Mild redness. Minimal pain. He took the prescribed bactrim.    Social History  Substance Use Topics  . Smoking status: Never Smoker  . Smokeless tobacco: Never Used  . Alcohol use No    Outpatient Medications Prior to Visit  Medication Sig Dispense Refill  . Blood Glucose Monitoring Suppl (TRUE METRIX METER) W/DEVICE KIT 1 each by Does not apply route 4 (four) times daily -  before meals and at bedtime. 1 kit ea  . cholestyramine (QUESTRAN) 4 g packet  Take 1 packet (4 g total) by mouth 3 (three) times daily with meals. 90 each 12  . diltiazem (CARDIZEM CD) 180 MG 24 hr capsule Take 1 capsule (180 mg total) by mouth daily. 30 capsule 5  . famotidine (PEPCID) 20 MG tablet Take 1 tablet (20 mg total) by mouth 2 (two) times daily. 30 tablet 0  . furosemide (LASIX) 20 MG tablet Take 1 tablet (20 mg total) by mouth daily. 30 tablet 2  . gabapentin (NEURONTIN) 300 MG capsule Take 1 capsule (300 mg total)  by mouth at bedtime. 30 capsule 3  . glucose blood test strip Use three times daily 100 each 12  . insulin aspart (NOVOLOG) 100 UNIT/ML FlexPen Inject 10 Units into the skin 3 (three) times daily with meals. 15 mL 3  . Insulin Glargine (LANTUS SOLOSTAR) 100 UNIT/ML Solostar Pen Inject 45 Units into the skin at bedtime. 90 mL 3  . insulin lispro (HUMALOG KWIKPEN) 100 UNIT/ML KiwkPen Inject 0.15 mLs (15 Units total) into the skin 3 (three) times daily. 45 mL 3  . Insulin Pen Needle 31G X 8 MM MISC Inject 1 each into the skin 4 (four) times daily. Check blood sugar TID and QHS 120 each 11  . lisinopril-hydrochlorothiazide (ZESTORETIC) 20-12.5 MG tablet Take 2 tablets by mouth daily. 60 tablet 3  . metoCLOPramide (REGLAN) 10 MG tablet TAKE 1 TABLET BY MOUTH 3 TIMES DAILY BEFORE MEALS AS NEEDED 90 tablet 0  . nortriptyline (PAMELOR) 10 MG capsule Take 1 capsule (10 mg total) by mouth at bedtime. 30 capsule 2  . pantoprazole (PROTONIX) 40 MG tablet Take 1 tablet (40 mg total) by mouth 2 (two) times daily. 60 tablet 5  . promethazine (PHENERGAN) 25 MG tablet TAKE 1 TABLET BY MOUTH EVERY 8 HOURS AS NEEDED FOR NAUSEA OR VOMITING. 60 tablet 0  . sucralfate (CARAFATE) 1 g tablet Take 1 tablet (1 g total) by mouth 4 (four) times daily -  with meals and at bedtime. 120 tablet 5  . TRUEPLUS LANCETS 28G MISC 1 each by Does not apply route 4 (four) times daily -  before meals and at bedtime. 100 each 12  . buPROPion (WELLBUTRIN XL) 150 MG 24 hr tablet Take 1 tablet (150 mg total) by mouth daily. (Patient not taking: Reported on 07/10/2016) 30 tablet 3  . ondansetron (ZOFRAN) 8 MG tablet Take 1 tablet (8 mg total) by mouth at bedtime. (Patient not taking: Reported on 07/10/2016) 30 tablet 0  . sulfamethoxazole-trimethoprim (BACTRIM DS) 800-160 MG tablet Take 1 tablet by mouth 2 (two) times daily. (Patient not taking: Reported on 07/10/2016) 20 tablet 0  . tadalafil 2.5 MG TABS Take 1 tablet (2.5 mg total) by mouth  daily. (Patient not taking: Reported on 07/10/2016) 30 tablet 5  . terbinafine (LAMISIL) 250 MG tablet Take 1 tablet (250 mg total) by mouth daily. (Patient not taking: Reported on 07/10/2016) 30 tablet 2   No facility-administered medications prior to visit.     ROS Review of Systems  Constitutional: Negative for chills, fatigue, fever and unexpected weight change.  Eyes: Positive for visual disturbance.  Respiratory: Negative for cough and shortness of breath.   Cardiovascular: Negative for chest pain, palpitations and leg swelling.  Gastrointestinal: Positive for diarrhea. Negative for abdominal pain, blood in stool, constipation, nausea and vomiting.  Endocrine: Positive for heat intolerance. Negative for polydipsia, polyphagia and polyuria.  Musculoskeletal: Positive for arthralgias, joint swelling and myalgias (myalgia in thighs, flanks and abdomen). Negative for  back pain, gait problem and neck pain.  Skin: Negative for rash.  Allergic/Immunologic: Negative for immunocompromised state.  Hematological: Negative for adenopathy. Does not bruise/bleed easily.  Psychiatric/Behavioral: Positive for dysphoric mood and sleep disturbance. Negative for suicidal ideas. The patient is nervous/anxious.     Objective:  BP (!) 141/89   Pulse 94   Temp 98.1 F (36.7 C) (Oral)   Wt (!) 340 lb 6.4 oz (154.4 kg)   SpO2 97%   BMI 42.55 kg/m   BP/Weight 07/10/2016 06/15/2016 08/11/4763  Systolic BP 465 035 465  Diastolic BP 89 95 98  Wt. (Lbs) 340.4 334.8 338  BMI 42.55 41.85 42.25   Wt Readings from Last 3 Encounters:  07/10/16 (!) 340 lb 6.4 oz (154.4 kg)  06/15/16 (!) 334 lb 12.8 oz (151.9 kg)  06/01/16 (!) 338 lb (153.3 kg)   Pulse Readings from Last 3 Encounters:  07/10/16 94  06/15/16 (!) 102  06/01/16 87   Physical Exam  Constitutional: He appears well-developed and well-nourished. No distress.  HENT:  Head: Normocephalic and atraumatic.  Neck: Normal range of motion. Neck  supple.  Cardiovascular: Normal rate, regular rhythm, normal heart sounds and intact distal pulses.   Pulmonary/Chest: Effort normal and breath sounds normal.  Abdominal: Soft. Bowel sounds are normal. He exhibits distension (slight distension ). He exhibits no mass. There is no tenderness. There is no rebound and no guarding.  Musculoskeletal: He exhibits edema (b/l trace ).  Neurological: He is alert.  Skin: Skin is warm and dry. No rash noted. No erythema.  Psychiatric: He has a normal mood and affect.    Lab Results  Component Value Date   HGBA1C 11.4 06/01/2016   CBG 152   Depression screen [facility] 2/9 06/15/2016 03/30/2016 12/23/2015  Decreased Interest _0 Down, Depressed, Hopeless _1 PHQ - 2 Score _2 Altered sleeping _3 Tired, decreased energy _4 Change in appetite _5 Feeling bad or failure about yourself  _6 Trouble concentrating _7 Moving slowly or fidgety/restless _8 Suicidal thoughts _9 PHQ-9 Score _10 GAD 7 : Generalized Anxiety Score 06/15/2016 03/30/2016 12/23/2015  Nervous, Anxious, on Edge _11 Control/stop worrying _12 Worry too much - different things _13 Trouble relaxing _14 Restless 2 1 0  Easily annoyed or irritable _15 Afraid - awful might happen _16 Total GAD 7 Score _17 Assessment & Plan:  Khush was seen today for follow-up.  Diagnoses and all orders for this visit:  Type 1 diabetes mellitus with diabetic polyneuropathy ([place]) -     POCT glucose (manual entry)   Tomoya was seen today for follow-up.  Diagnoses and all orders for this visit:  Type 1 diabetes mellitus with diabetic polyneuropathy (HCC) -     POCT glucose (manual entry) -     HgB A1c  Chronic diarrhea -     nortriptyline (PAMELOR) 25 MG capsule; Take 25 mg before bedtime for one week, then 50 mg before bedtime  MDD (major depressive disorder), single episode, severe , no psychosis (HCC) -     nortriptyline  (PAMELOR) 25 MG capsule; Take 25 mg before bedtime for one week, then 50 mg before bedtime  Diabetic autonomic  neuropathy associated with diabetes mellitus due to underlying condition (HCC) -     gabapentin (NEURONTIN) 300 MG capsule; Take 1 capsule (300 mg total) by mouth at bedtime.  Bilateral leg edema -     VAS [name] LOWER EXTREMITY VENOUS (DVT); Future    No orders of the defined types were placed in this encounter.   Follow-up: Return in about 4 weeks (around 08/07/2016) for chronic diarrhea and leg swelling.   Boykin Nearing MD

## 2016-07-10 NOTE — Patient Instructions (Addendum)
Duane [name] was seen today for follow-up.  Diagnoses and all orders for this visit:  Type 1 diabetes mellitus with diabetic polyneuropathy (HCC) -     POCT glucose (manual entry) -     HgB A1c  Chronic diarrhea -     nortriptyline (PAMELOR) 25 MG capsule; Take 25 mg before bedtime for one week, then 50 mg before bedtime  MDD (major depressive disorder), single episode, severe , no psychosis (HCC) -     nortriptyline (PAMELOR) 25 MG capsule; Take 25 mg before bedtime for one week, then 50 mg before bedtime  Diabetic autonomic neuropathy associated with diabetes mellitus due to underlying condition (HCC) -     gabapentin (NEURONTIN) 300 MG capsule; Take 1 capsule (300 mg total) by mouth at bedtime.  Bilateral leg edema -     VAS [name] LOWER EXTREMITY VENOUS (DVT); Future   F/u in 4 weeks for chronic diarrhea and leg swelling   Dr. Adrian Blackwater

## 2016-07-12 ENCOUNTER — Ambulatory Visit (HOSPITAL_COMMUNITY): Admission: RE | Admit: 2016-07-12 | Payer: Self-pay | Source: Ambulatory Visit

## 2016-07-12 NOTE — Assessment & Plan Note (Signed)
Normotensive and normal heart rate on current regimen Persistent leg swelling  Continue current regimen Ordered LE venous dopplers

## 2016-07-12 NOTE — Assessment & Plan Note (Addendum)
A: chronic diarrhea x 2 years. No blood in stool. Previous stool cultures were negative for C. Diff. Repeat culture ordered, patient has yet to provide sample. Abdominal plain film ordered to assess stool burden. DDx: IBS, reglan induced diarrhea, encopresis.  P: Stool cultures Abdominal x-ray  GI referral

## 2016-07-12 NOTE — Assessment & Plan Note (Signed)
Healed with bactrim

## 2016-07-12 NOTE — Assessment & Plan Note (Signed)
A: improved with higher insulin doses P: Continue current regimen

## 2016-07-12 NOTE — Assessment & Plan Note (Signed)
He has documented gastroparesis Taking reglan, protonix, phenergan

## 2016-07-12 NOTE — Assessment & Plan Note (Signed)
Persistent depression  Patient sites his diarrhea and GI problems as his major source of depression  Plan: Increase pamelor for depression

## 2016-07-13 ENCOUNTER — Other Ambulatory Visit: Payer: Self-pay | Admitting: Family Medicine

## 2016-07-13 DIAGNOSIS — K3184 Gastroparesis: Secondary | ICD-10-CM

## 2016-07-13 DIAGNOSIS — E1043 Type 1 diabetes mellitus with diabetic autonomic (poly)neuropathy: Secondary | ICD-10-CM

## 2016-07-13 DIAGNOSIS — K529 Noninfective gastroenteritis and colitis, unspecified: Secondary | ICD-10-CM

## 2016-07-13 MED FILL — ?PANTOPRAZOLE SOD DR 40MG: 40 MG | 30 days supply | Qty: 60 | Fill #3

## 2016-07-16 NOTE — Telephone Encounter (Signed)
Called patient Left VM reglan refilled  Thinking about his chronic diarrhea I am suspicious for exocrine pancreatic insufficiency (EPI)  He fits the picture due to his type 1 diabetes with hyperglycemia requiring high dose of insulin Chronic diarrhea with bloating.   Fecal elastase test has been ordered.  If confirmatory will start creon If inconclusive direct testing by GI needed

## 2016-07-16 NOTE — Assessment & Plan Note (Signed)
A: chronic diarrhea x 2 years. No blood in stool. Previous stool cultures were negative for C. Diff. Repeat culture ordered, patient has yet to provide sample. Abdominal plain film ordered to assess stool burden. DDx:  Exocrine pancreatic insufficiency, IBS, reglan induced diarrhea, encopresis,  P: Stool cultures Abdominal x-ray  GI referral   Thinking and researching more about his chronic diarrhea I am suspicious for exocrine pancreatic insufficiency (EPI)  He fits the picture due to his type 1 diabetes with hyperglycemia requiring high dose of insulin Chronic diarrhea with bloating.   Fecal elastase test has been ordered.  If confirmatory will start creon If inconclusive direct testing by GI needed

## 2016-07-17 MED FILL — METOCLOPRAMIDE 10 MG TABLET: 10 | 30 days supply | Qty: 90 | Fill #0

## 2016-07-27 MED FILL — $HUMALOG 100 UNITS/ML KWIKP: 100 | 33 days supply | Qty: 15 | Fill #1

## 2016-07-27 MED FILL — DILTIAZEM 24HR ER 180 MG CA: 180 | 30 days supply | Qty: 30 | Fill #1

## 2016-07-27 MED FILL — $LANTUS 100 UNITS/ML VIAL: 100 | 44 days supply | Qty: 20 | Fill #1

## 2016-08-07 MED FILL — ?FUROSEMIDE 20 MG TABLET: 20 | 30 days supply | Qty: 30 | Fill #1

## 2016-08-07 MED FILL — LISINOPRIL-HCTZ 20-12.5 MG: 20-12.5 | 30 days supply | Qty: 60 | Fill #2

## 2016-08-09 ENCOUNTER — Ambulatory Visit: Payer: Self-pay | Admitting: Family Medicine

## 2016-08-10 MED FILL — CHOLESTYRAMINE PACKET: 4 | 20 days supply | Qty: 60 | Fill #1

## 2016-08-10 MED FILL — SUCRALFATE 1 GM TABLET: 1 | 30 days supply | Qty: 120 | Fill #2

## 2016-08-13 MED FILL — GABAPENTIN 300 MG CAPSULE: 300 | 30 days supply | Qty: 30 | Fill #1

## 2016-08-16 ENCOUNTER — Other Ambulatory Visit: Payer: Self-pay | Admitting: Family Medicine

## 2016-08-16 DIAGNOSIS — E1043 Type 1 diabetes mellitus with diabetic autonomic (poly)neuropathy: Secondary | ICD-10-CM

## 2016-08-16 DIAGNOSIS — K3184 Gastroparesis: Principal | ICD-10-CM

## 2016-08-17 MED FILL — METOCLOPRAMIDE 10 MG TABLET: 10 | 30 days supply | Qty: 90 | Fill #0

## 2016-08-20 ENCOUNTER — Encounter: Payer: Self-pay | Admitting: Family Medicine

## 2016-08-20 ENCOUNTER — Ambulatory Visit: Payer: Medicare Other | Attending: Family Medicine | Admitting: Family Medicine

## 2016-08-20 VITALS — BP 162/99 | HR 109 | Temp 98.0°F | Ht 75.0 in | Wt 338.2 lb

## 2016-08-20 DIAGNOSIS — F329 Major depressive disorder, single episode, unspecified: Secondary | ICD-10-CM | POA: Insufficient documentation

## 2016-08-20 DIAGNOSIS — R61 Generalized hyperhidrosis: Secondary | ICD-10-CM | POA: Diagnosis not present

## 2016-08-20 DIAGNOSIS — Z9049 Acquired absence of other specified parts of digestive tract: Secondary | ICD-10-CM | POA: Diagnosis not present

## 2016-08-20 DIAGNOSIS — F322 Major depressive disorder, single episode, severe without psychotic features: Secondary | ICD-10-CM | POA: Diagnosis not present

## 2016-08-20 DIAGNOSIS — K3184 Gastroparesis: Secondary | ICD-10-CM

## 2016-08-20 DIAGNOSIS — I10 Essential (primary) hypertension: Secondary | ICD-10-CM | POA: Diagnosis not present

## 2016-08-20 DIAGNOSIS — M25462 Effusion, left knee: Secondary | ICD-10-CM | POA: Diagnosis not present

## 2016-08-20 DIAGNOSIS — E1065 Type 1 diabetes mellitus with hyperglycemia: Secondary | ICD-10-CM | POA: Diagnosis not present

## 2016-08-20 DIAGNOSIS — E1043 Type 1 diabetes mellitus with diabetic autonomic (poly)neuropathy: Secondary | ICD-10-CM | POA: Diagnosis not present

## 2016-08-20 DIAGNOSIS — K529 Noninfective gastroenteritis and colitis, unspecified: Secondary | ICD-10-CM | POA: Diagnosis not present

## 2016-08-20 DIAGNOSIS — Z794 Long term (current) use of insulin: Secondary | ICD-10-CM | POA: Diagnosis not present

## 2016-08-20 DIAGNOSIS — M7989 Other specified soft tissue disorders: Secondary | ICD-10-CM | POA: Insufficient documentation

## 2016-08-20 DIAGNOSIS — E1042 Type 1 diabetes mellitus with diabetic polyneuropathy: Secondary | ICD-10-CM | POA: Diagnosis not present

## 2016-08-20 DIAGNOSIS — M25562 Pain in left knee: Secondary | ICD-10-CM

## 2016-08-20 DIAGNOSIS — E559 Vitamin D deficiency, unspecified: Secondary | ICD-10-CM | POA: Insufficient documentation

## 2016-08-20 LAB — GLUCOSE, POCT (MANUAL RESULT ENTRY): POC Glucose: 255 mg/dl — AB (ref 70–99)

## 2016-08-20 MED ORDER — PANCRELIPASE (LIP-PROT-AMYL) 20000-68000 UNITS PO CPEP: 1.0000 | ORAL_CAPSULE | Freq: Three times a day (TID) | ORAL | 2 refills | Status: DC

## 2016-08-20 MED ORDER — FUROSEMIDE 40 MG PO TABS: 40.0000 mg | ORAL_TABLET | Freq: Every day | ORAL | 0 refills | Status: DC

## 2016-08-20 MED ORDER — PANCRELIPASE (LIP-PROT-AMYL) 5000-24000 UNITS PO CPEP: 5.0000 | ORAL_CAPSULE | Freq: Three times a day (TID) | ORAL | 2 refills | Status: DC

## 2016-08-20 MED ORDER — GLUCOSE BLOOD VI STRP: 1.0000 | ORAL_STRIP | Freq: Three times a day (TID) | 11 refills | Status: AC

## 2016-08-20 MED ORDER — METOCLOPRAMIDE HCL 10 MG PO TABS: ORAL_TABLET | ORAL | 5 refills | Status: DC

## 2016-08-20 MED FILL — TRUE METRIX TEST STRIP: 33 days supply | Qty: 100 | Fill #0

## 2016-08-20 NOTE — Assessment & Plan Note (Signed)
A: HTN with leg swelling Med: compliant P: Increase lasix to 40 mg daily BMP, BNP ordered

## 2016-08-20 NOTE — Assessment & Plan Note (Signed)
Improved with increased insulin doses Patient will continue to monitor He skips meals due to chronic diarrhea which makes dosing insulin difficult Evaluation and treating chronic diarrhea

## 2016-08-20 NOTE — Patient Instructions (Addendum)
Duane [name] was seen today for diabetes and edema.  Diagnoses and all orders for this visit:  Type 1 diabetes mellitus with diabetic polyneuropathy ([place]) -     POCT glucose (manual entry) -     Ambulatory referral to Endocrinology -     Ambulatory referral to Podiatry -     Ambulatory referral to Ophthalmology -     glucose blood (TRUE METRIX BLOOD GLUCOSE TEST) test strip; 1 each by Other route 3 (three) times daily.  Diabetic gastroparesis associated with type 1 diabetes mellitus ([place]) -     Ambulatory referral to Gastroenterology -     metoCLOPramide (REGLAN) 10 MG tablet; TAKE 1 TABLET BY MOUTH 3 TIMES DAILY BEFORE MEALS AS NEEDED  Chronic diarrhea -     Pancreatic elastase, fecal -     Cdiff NAA+O+P+Stool Culture -     Ambulatory referral to Endocrinology -     Vitamin D, 25-hydroxy -     Pancrelipase, Lip-Prot-Amyl, (ZENPEP) 5000-24000 units CPEP; Take 5 capsules by mouth 3 (three) times daily with meals.  Excessive sweating -     Ambulatory referral to Dermatology  Pain and swelling of left knee -     Ambulatory referral to Orthopedic Surgery  Leg swelling -     furosemide (LASIX) 40 MG tablet; Take 1 tablet (40 mg total) by mouth daily. -     BMP8+EGFR; Future -     Brain natriuretic peptide  MDD (major depressive disorder), single episode, severe , no psychosis ([place]) -     Ambulatory referral to Psychology   Start pancrelipase under the brand name zenpep, take 5 capsules 3 time daily  with each meal   F/u in 2 week for leg swelling and chronic diarrhea   Dr. Adrian Blackwater   Starting on October 01, 2016 I will be seeing patient at [facility]. You are welcome to follow up with me there if you like. [facility] accepts insurance and self pay.   [facility] at [facility]  [address] [place], [place] [address]  Ph: [phone] Fax: [phone]

## 2016-08-20 NOTE — Progress Notes (Addendum)
Subjective:  Patient ID: Duane [name], male    DOB: [birth year]  Age: 41 y.o. MRN: [id number]  CC: Diabetes and Edema   HPI Duane [name] has depression, diabetes type 1 uncontrolled with diabetic gastroparesis ( delayed gastric emptying study 08/2013) and diabetic neuropathy , HTN,  chronic diarrhea he presents for    1. Diarrhea: started in 06/2014 after acute episode attributed to gastroenteritis. Symptoms persisted. C. Diff negative in 12/2014. Diarrhea usually in early mornings between 3-5 AM. Watery to soft bowel movements. Last 4-5 hrs up to all day. Fatty foods worsen diarrhea. Sometimes cheeses or dairy items cause abdominal  pain. Has hx of cholecystectomy. He takes cholestyramine and fiber supplements.  Diarrhea is associated with excessive gas and bloating. No fever. No blood in stool. Denies constipation. Has uncontrolled IDDM type 1 with gastroparesis. No family history of IBS/IBD or lactose intolerance. He has decreased gas producing foods and lactose from his diet. This has not helped him much. He has darks stools at times, brown or green. No frank blood. He skips meals to prevent diarrhea when going out.    He has long history of gastroparesis and has seen GI and had workup in the past. The patient has had a gastric emptying study on 08/18/2013 which revealed 100% retention of food products at 120 minutes. He had an EGD performed on 06/11/2013 which showed a hiatus hernia, but was normal otherwise. A flexible sigmoidoscopy was performed on 06/11/2013 which was normal up to the splenic flexure. He continues to take reglan 10 mg TID, phenergan, carafate and protonix.   DG abdomen 01/01/15 FINDINGS: No bowel dilation is seen to suggest obstruction. There is no adynamic ileus or free air. Mild colonic stool burden is noted.  Status post cholecystectomy, stable. No evidence of renal or ureteral stones. Bony structures are unremarkable.  IMPRESSION: 1. No acute findings. No  evidence of bowel obstruction, generalized adynamic ileus or free air  2. CHRONIC DIABETES  Disease Monitoring  Blood Sugar Ranges:   Fasting: 150   Post prandial: 200 or higher, depends on what he eats   Polyuria: no   Visual problems: yes    Medication Compliance: yes, taking lantus 45 U daily, Humalog 15 with meals (10 U if he does not eat Medication Side Effects  Hypoglycemia: no   Preventitive Health Care  Eye Exam: due   Foot Exam: done today   Diet pattern: eats irregular meals, eating 1-2 meals a day.   Exercise: yes    3. HTN: compliant with regimen. He continue to have leg swelling worse in the R leg. He is on a calcium channel blocker, diltiazem to help control BP and heart rate. He denies HA, CP or SOB. He reports cramping in flanks.    Social History  Substance Use Topics  . Smoking status: Never Smoker  . Smokeless tobacco: Never Used  . Alcohol use No    Outpatient Medications Prior to Visit  Medication Sig Dispense Refill  . Blood Glucose Monitoring Suppl (TRUE METRIX METER) W/DEVICE KIT 1 each by Does not apply route 4 (four) times daily -  before meals and at bedtime. 1 kit ea  . cholestyramine (QUESTRAN) 4 g packet Take 1 packet (4 g total) by mouth 3 (three) times daily with meals. 90 each 12  . diltiazem (CARDIZEM CD) 180 MG 24 hr capsule Take 1 capsule (180 mg total) by mouth daily. 30 capsule 5  . famotidine (PEPCID) 20 MG tablet Take 1 tablet (  20 mg total) by mouth 2 (two) times daily. 30 tablet 0  . furosemide (LASIX) 20 MG tablet Take 1 tablet (20 mg total) by mouth daily. 30 tablet 2  . gabapentin (NEURONTIN) 300 MG capsule Take 1 capsule (300 mg total) by mouth at bedtime. 30 capsule 3  . glucose blood (TRUE METRIX BLOOD GLUCOSE TEST) test strip 1 each by Other route 3 (three) times daily. 100 each 11  . Insulin Glargine (LANTUS SOLOSTAR) 100 UNIT/ML Solostar Pen Inject 45 Units into the skin at bedtime. 90 mL 3  . insulin lispro (HUMALOG KWIKPEN)  100 UNIT/ML KiwkPen Inject 0.15 mLs (15 Units total) into the skin 3 (three) times daily. 45 mL 3  . Insulin Pen Needle 31G X 8 MM MISC Inject 1 each into the skin 4 (four) times daily. Check blood sugar TID and QHS 120 each 11  . lisinopril-hydrochlorothiazide (ZESTORETIC) 20-12.5 MG tablet Take 2 tablets by mouth daily. 60 tablet 3  . metoCLOPramide (REGLAN) 10 MG tablet TAKE 1 TABLET BY MOUTH 3 TIMES DAILY BEFORE MEALS AS NEEDED 90 tablet 0  . nortriptyline (PAMELOR) 25 MG capsule Take 25 mg before bedtime for one week, then 50 mg before bedtime 60 capsule 2  . pantoprazole (PROTONIX) 40 MG tablet Take 1 tablet (40 mg total) by mouth 2 (two) times daily. 60 tablet 5  . promethazine (PHENERGAN) 25 MG tablet TAKE 1 TABLET BY MOUTH EVERY 8 HOURS AS NEEDED FOR NAUSEA OR VOMITING. 60 tablet 0  . sucralfate (CARAFATE) 1 g tablet Take 1 tablet (1 g total) by mouth 4 (four) times daily -  with meals and at bedtime. 120 tablet 5  . TRUEPLUS LANCETS 28G MISC 1 each by Does not apply route 4 (four) times daily -  before meals and at bedtime. 100 each 12  . tadalafil 2.5 MG TABS Take 1 tablet (2.5 mg total) by mouth daily. (Patient not taking: Reported on 07/10/2016) 30 tablet 5   No facility-administered medications prior to visit.     ROS Review of Systems  Constitutional: Negative for chills, fatigue, fever and unexpected weight change.  Eyes: Positive for visual disturbance.  Respiratory: Negative for cough and shortness of breath.   Cardiovascular: Negative for chest pain, palpitations and leg swelling.  Gastrointestinal: Positive for diarrhea. Negative for abdominal pain, blood in stool, constipation, nausea and vomiting.       Excessive gas   Endocrine: Positive for heat intolerance (excessive sweating ). Negative for polydipsia, polyphagia and polyuria.  Musculoskeletal: Positive for arthralgias, joint swelling and myalgias (myalgia in thighs, flanks and abdomen). Negative for back pain, gait  problem and neck pain.  Skin: Negative for rash.  Allergic/Immunologic: Negative for immunocompromised state.  Hematological: Negative for adenopathy. Does not bruise/bleed easily.  Psychiatric/Behavioral: Positive for dysphoric mood and sleep disturbance. Negative for suicidal ideas. The patient is nervous/anxious.     Objective:  BP (!) 162/99   Pulse (!) 109   Temp 98 F (36.7 C) (Oral)   Ht _0  (1.905 m)   Wt (!) 338 lb 3.2 oz (153.4 kg)   SpO2 99%   BMI 42.27 kg/m   BP/Weight 08/20/2016 06/10/7679 02/17/7260  Systolic BP 035 597 416  Diastolic BP 99 89 95  Wt. (Lbs) 338.2 340.4 334.8  BMI 42.27 42.55 41.85   Wt Readings from Last 3 Encounters:  08/20/16 (!) 338 lb 3.2 oz (153.4 kg)  07/10/16 (!) 340 lb 6.4 oz (154.4 kg)  06/15/16 (!) 334 lb  12.8 oz (151.9 kg)   Pulse Readings from Last 3 Encounters:  08/20/16 (!) 109  07/10/16 94  06/15/16 (!) 102   Physical Exam  Constitutional: He appears well-developed and well-nourished. No distress.  HENT:  Head: Normocephalic and atraumatic.  Neck: Normal range of motion. Neck supple.  Cardiovascular: Normal rate, regular rhythm, normal heart sounds and intact distal pulses.   Pulmonary/Chest: Effort normal and breath sounds normal.  Abdominal: Soft. Bowel sounds are normal. He exhibits no distension and no mass. There is no tenderness. There is no rebound and no guarding.  Musculoskeletal: He exhibits edema (B/l 1 + on R, trace on L ).  Neurological: He is alert.  Skin: Skin is warm and dry. No rash noted. No erythema.  Psychiatric: He has a normal mood and affect.    Lab Results  Component Value Date   HGBA1C 11.4 07/10/2016   CBG 255  Depression screen [facility] 2/9 08/20/2016 07/10/2016 06/15/2016  Decreased Interest _0 Down, Depressed, Hopeless _1 PHQ - 2 Score _2 Altered sleeping _3 Tired, decreased energy _4 Change in appetite _5 Feeling bad or failure about yourself  _6 Trouble  concentrating _7 Moving slowly or fidgety/restless _8 Suicidal thoughts _9 PHQ-9 Score _10 Some recent data might be hidden   GAD 7 : Generalized Anxiety Score 08/20/2016 07/10/2016 06/15/2016 03/30/2016  Nervous, Anxious, on Edge _11 Control/stop worrying _12 Worry too much - different things _13 Trouble relaxing _14 Restless _15 Easily annoyed or irritable _16 Afraid - awful might happen _17 Total GAD 7 Score _18 Assessment & Plan:  Duane [name] was seen today for diabetes and edema.  Diagnoses and all orders for this visit:  Type 1 diabetes mellitus with diabetic polyneuropathy ([place]) -     POCT glucose (manual entry) -     Ambulatory referral to Endocrinology -     Ambulatory referral to Podiatry -     Ambulatory referral to Ophthalmology -     glucose blood (TRUE METRIX BLOOD GLUCOSE TEST) test strip; 1 each by Other route 3 (three) times daily.  Diabetic gastroparesis associated with type 1 diabetes mellitus ([place]) -     Ambulatory referral to Gastroenterology -     metoCLOPramide (REGLAN) 10 MG tablet; TAKE 1 TABLET BY MOUTH 3 TIMES DAILY BEFORE MEALS AS NEEDED  Chronic diarrhea -     Pancreatic elastase, fecal -     Cdiff NAA+O+P+Stool Culture -     Ambulatory referral to Endocrinology -     Vitamin D, 25-hydroxy -     Discontinue: Pancrelipase, Lip-Prot-Amyl, (ZENPEP) 5000-24000 units CPEP; Take 5 capsules by mouth 3 (three) times daily with meals. -     Pancrelipase, Lip-Prot-Amyl, (ZENPEP) 20000-68000 units CPEP; Take 1 capsule (20,000 Units total) by mouth 3 (three) times daily with meals.  Excessive sweating -     Ambulatory referral to Dermatology  Pain and swelling of left knee -     Ambulatory referral to Orthopedic Surgery  Leg swelling -     furosemide (LASIX) 40 MG tablet; Take 1 tablet (40 mg total) by mouth daily. -  BMP8+EGFR; Future -     Brain natriuretic peptide -     BMP8+EGFR  MDD  (major depressive disorder), single episode, severe , no psychosis ([place]) -     Ambulatory referral to Psychology  Essential hypertension  Vitamin D deficiency -     Vitamin D, Ergocalciferol, (DRISDOL) 50000 units CAPS capsule; Take 1 capsule (50,000 Units total) by mouth every 7 (seven) days. For 12 weeks    No orders of the defined types were placed in this encounter.   Follow-up: Return in about 2 weeks (around 09/03/2016) for diarrhea and leg swelling .   Boykin Nearing MD

## 2016-08-20 NOTE — Assessment & Plan Note (Addendum)
Chronic diarrhea with uncontrolled type 1 diabetes Fatty stools Worsened by high fat diet Suspect exocrine pancreatic insufficiency Stool sample given today for C . Diff and stool culture  And fecal pancreatic elastase zenpep ordered 20000 units of lipase TID with meals   Vitamin D ordered

## 2016-08-21 ENCOUNTER — Encounter: Payer: Self-pay | Admitting: Family Medicine

## 2016-08-21 DIAGNOSIS — E559 Vitamin D deficiency, unspecified: Secondary | ICD-10-CM | POA: Insufficient documentation

## 2016-08-21 DIAGNOSIS — R61 Generalized hyperhidrosis: Secondary | ICD-10-CM | POA: Insufficient documentation

## 2016-08-21 DIAGNOSIS — M7989 Other specified soft tissue disorders: Secondary | ICD-10-CM | POA: Insufficient documentation

## 2016-08-21 LAB — BMP8+EGFR
BUN / CREAT RATIO: 13 (ref 9–20)
BUN: 23 mg/dL — ABNORMAL HIGH (ref 6–20)
CO2: 20 mmol/L (ref 20–29)
CREATININE: 1.79 mg/dL — AB (ref 0.76–1.27)
Calcium: 9.9 mg/dL (ref 8.7–10.2)
Chloride: 100 mmol/L (ref 96–106)
GFR, EST AFRICAN AMERICAN: 54 mL/min/{1.73_m2} — AB (ref >59)
GFR, EST NON AFRICAN AMERICAN: 47 mL/min/{1.73_m2} — AB (ref >59)
Glucose: 208 mg/dL — ABNORMAL HIGH (ref 65–99)
POTASSIUM: 4.9 mmol/L (ref 3.5–5.2)
SODIUM: 139 mmol/L (ref 134–144)

## 2016-08-21 LAB — VITAMIN D 25 HYDROXY (VIT D DEFICIENCY, FRACTURES): Vit D, 25-Hydroxy: 6.3 ng/mL — ABNORMAL LOW (ref 30.0–100.0)

## 2016-08-21 LAB — BRAIN NATRIURETIC PEPTIDE: BNP: <2.5 pg/mL (ref 0.0–100.0)

## 2016-08-21 MED ORDER — VITAMIN D (ERGOCALCIFEROL) 1.25 MG (50000 UNIT) PO CAPS: 50000.0000 [IU] | ORAL_CAPSULE | ORAL | 2 refills | Status: AC

## 2016-08-21 MED FILL — VIT D2 1.25 MG (50,000 UNIT: 1.25 MG | 28 days supply | Qty: 4 | Fill #0

## 2016-08-21 NOTE — Addendum Note (Signed)
Addended by: Boykin Nearing on: 08/21/2016 08:16 AM   Modules accepted: Orders

## 2016-08-22 ENCOUNTER — Telehealth: Payer: Self-pay

## 2016-08-22 NOTE — Telephone Encounter (Signed)
Pt was called and informed of lab results. 

## 2016-08-23 MED ORDER — PANCRELIPASE (LIP-PROT-AMYL) 20000-63000 UNITS PO CPEP: 1.0000 | ORAL_CAPSULE | Freq: Three times a day (TID) | ORAL | 2 refills | Status: DC

## 2016-08-23 MED FILL — !ZENPEP 20000-63000 UNIT CA: 20000-63000 | 33 days supply | Qty: 100 | Fill #0

## 2016-08-23 NOTE — Addendum Note (Signed)
Addended by: Boykin Nearing on: 08/23/2016 12:23 PM   Modules accepted: Orders

## 2016-08-28 LAB — CDIFF NAA+O+P+STOOL CULTURE: E coli, Shiga toxin Assay: NEGATIVE

## 2016-08-31 MED FILL — DILTIAZEM 24HR ER 180 MG CA: 180 | 30 days supply | Qty: 30 | Fill #2

## 2016-09-03 ENCOUNTER — Ambulatory Visit: Payer: Medicare Other | Attending: Family Medicine | Admitting: Family Medicine

## 2016-09-03 ENCOUNTER — Encounter: Payer: Self-pay | Admitting: Family Medicine

## 2016-09-03 VITALS — BP 153/88 | HR 95 | Temp 98.5°F | Ht 75.0 in | Wt 333.8 lb

## 2016-09-03 DIAGNOSIS — K529 Noninfective gastroenteritis and colitis, unspecified: Secondary | ICD-10-CM

## 2016-09-03 DIAGNOSIS — E1043 Type 1 diabetes mellitus with diabetic autonomic (poly)neuropathy: Secondary | ICD-10-CM | POA: Insufficient documentation

## 2016-09-03 DIAGNOSIS — M7989 Other specified soft tissue disorders: Secondary | ICD-10-CM | POA: Insufficient documentation

## 2016-09-03 DIAGNOSIS — E1042 Type 1 diabetes mellitus with diabetic polyneuropathy: Secondary | ICD-10-CM | POA: Diagnosis not present

## 2016-09-03 DIAGNOSIS — K3184 Gastroparesis: Secondary | ICD-10-CM | POA: Insufficient documentation

## 2016-09-03 DIAGNOSIS — E1065 Type 1 diabetes mellitus with hyperglycemia: Secondary | ICD-10-CM | POA: Insufficient documentation

## 2016-09-03 DIAGNOSIS — F329 Major depressive disorder, single episode, unspecified: Secondary | ICD-10-CM | POA: Diagnosis not present

## 2016-09-03 DIAGNOSIS — Z794 Long term (current) use of insulin: Secondary | ICD-10-CM | POA: Insufficient documentation

## 2016-09-03 DIAGNOSIS — Z9049 Acquired absence of other specified parts of digestive tract: Secondary | ICD-10-CM | POA: Insufficient documentation

## 2016-09-03 DIAGNOSIS — I10 Essential (primary) hypertension: Secondary | ICD-10-CM | POA: Diagnosis not present

## 2016-09-03 LAB — GLUCOSE, POCT (MANUAL RESULT ENTRY): POC Glucose: 248 mg/dl — AB (ref 70–99)

## 2016-09-03 MED ORDER — TORSEMIDE 20 MG PO TABS: 20.0000 mg | ORAL_TABLET | Freq: Every day | ORAL | 1 refills | Status: DC

## 2016-09-03 MED ORDER — INSULIN GLARGINE 100 UNIT/ML SOLOSTAR PEN: 55.0000 [IU] | PEN_INJECTOR | Freq: Every day | SUBCUTANEOUS | 3 refills | Status: DC

## 2016-09-03 MED ORDER — INSULIN LISPRO 100 UNIT/ML (KWIKPEN): 15.0000 [IU] | PEN_INJECTOR | Freq: Three times a day (TID) | SUBCUTANEOUS | 3 refills | Status: DC

## 2016-09-03 MED ORDER — PANCRELIPASE (LIP-PROT-AMYL) 20000-63000 UNITS PO CPEP: 2.0000 | ORAL_CAPSULE | Freq: Three times a day (TID) | ORAL | 3 refills | Status: DC

## 2016-09-03 NOTE — Patient Instructions (Addendum)
Mukhtar was seen today for follow-up.  Diagnoses and all orders for this visit:  Type 1 diabetes mellitus with diabetic polyneuropathy ([facility]) -     POCT glucose (manual entry) -     Insulin Glargine (LANTUS SOLOSTAR) 100 UNIT/ML Solostar Pen; Inject 55 Units into the skin at bedtime. -     insulin lispro (HUMALOG KWIKPEN) 100 UNIT/ML KiwkPen; Inject 0.15 mLs (15 Units total) into the skin 3 (three) times daily.  Leg swelling -     torsemide (DEMADEX) 20 MG tablet; Take 1 tablet (20 mg total) by mouth daily.  Chronic diarrhea -     Pancrelipase, Lip-Prot-Amyl, (ZENPEP) 20000-63000 units CPEP; Take 2 capsules by mouth 3 (three) times daily before meals. Via PASS  increase lantus to 55 U from 45 U daily.  Please replace lasix with torsemide for leg swelling and HTN Increase pancrelipase to two capsules twice daily Please call back to [facility] and inquire about a payment plan   You have an appointment at [facility] 09-19-16 @ 9:20am  Ph# [phone] Address [address] .[facility] will mail a paper work with appointment details .    Return in 4 weeks for diabetes, hypertension and chronic diarrhea  Dr. Adrian Blackwater

## 2016-09-03 NOTE — Assessment & Plan Note (Signed)
Having fasting and postprandial hyperglycemia Increase lantus to 55 U daily Continue novolog 15 U  TID

## 2016-09-03 NOTE — Progress Notes (Signed)
Subjective:  Patient ID: Duane [name], male    DOB: [birth year]  Age: 41 y.o. MRN: [id number]  CC: Follow-up   HPI Quy Lotts has depression, diabetes type 1 uncontrolled with diabetic gastroparesis ( delayed gastric emptying study 08/2013) and diabetic neuropathy , HTN,  chronic diarrhea he presents for    1. Diarrhea: started in 06/2014 after acute episode attributed to gastroenteritis. Symptoms persisted. C. Diff negative in 12/2014. Diarrhea usually in early mornings between 3-5 AM. Watery to soft bowel movements. Last 4-5 hrs up to all day. Fatty foods worsen diarrhea. Sometimes cheeses or dairy items cause abdominal  pain. Has hx of cholecystectomy. He takes cholestyramine and fiber supplements.  Diarrhea is associated with excessive gas and bloating. No fever. No blood in stool. Denies constipation. Has uncontrolled IDDM type 1 with gastroparesis. No family history of IBS/IBD or lactose intolerance. He has decreased gas producing foods and lactose from his diet. This has not helped him much. He has darks stools at times, brown or green. No frank blood. He skips meals to prevent diarrhea when going out.    He has long history of gastroparesis and has seen GI and had workup in the past. The patient has had a gastric emptying study on 08/18/2013 which revealed 100% retention of food products at 120 minutes. He had an EGD performed on 06/11/2013 which showed a hiatus hernia, but was normal otherwise. A flexible sigmoidoscopy was performed on 06/11/2013 which was normal up to the splenic flexure. He continues to take reglan 10 mg TID, phenergan, carafate and protonix.   He started zenpep (20000 U lipase)  2 weeks ago. He reports less stool frequency but still has soft and watery stools.   DG abdomen 01/01/15 FINDINGS: No bowel dilation is seen to suggest obstruction. There is no adynamic ileus or free air. Mild colonic stool burden is noted.  Status post cholecystectomy, stable. No  evidence of renal or ureteral stones. Bony structures are unremarkable.  IMPRESSION: 1. No acute findings. No evidence of bowel obstruction, generalized adynamic ileus or free air  2. CHRONIC DIABETES  Disease Monitoring  Blood Sugar Ranges:   Fasting: 150-180  Post prandial: 200 or higher, depends on what he eats   Polyuria: no   Visual problems: yes    Medication Compliance: yes, taking lantus 45 U daily, Humalog 15 with meals (10 U if he does not eat Medication Side Effects  Hypoglycemia: no   Preventitive Health Care  Eye Exam: due   Foot Exam: done today   Diet pattern: eats irregular meals, eating 1-2 meals a day.   Exercise: yes    3. HTN: compliant with regimen. He continue to have leg swelling worse in the R leg. He is on a calcium channel blocker, diltiazem to help control BP and heart rate. He denies HA, CP or SOB.   Social History  Substance Use Topics  . Smoking status: Never Smoker  . Smokeless tobacco: Never Used  . Alcohol use No    Outpatient Medications Prior to Visit  Medication Sig Dispense Refill  . Blood Glucose Monitoring Suppl (TRUE METRIX METER) W/DEVICE KIT 1 each by Does not apply route 4 (four) times daily -  before meals and at bedtime. 1 kit ea  . cholestyramine (QUESTRAN) 4 g packet Take 1 packet (4 g total) by mouth 3 (three) times daily with meals. 90 each 12  . diltiazem (CARDIZEM CD) 180 MG 24 hr capsule Take 1 capsule (180 mg total) by  mouth daily. 30 capsule 5  . famotidine (PEPCID) 20 MG tablet Take 1 tablet (20 mg total) by mouth 2 (two) times daily. 30 tablet 0  . furosemide (LASIX) 40 MG tablet Take 1 tablet (40 mg total) by mouth daily. 30 tablet 0  . gabapentin (NEURONTIN) 300 MG capsule Take 1 capsule (300 mg total) by mouth at bedtime. 30 capsule 3  . glucose blood (TRUE METRIX BLOOD GLUCOSE TEST) test strip 1 each by Other route 3 (three) times daily. 100 each 11  . Insulin Glargine (LANTUS SOLOSTAR) 100 UNIT/ML Solostar Pen  Inject 45 Units into the skin at bedtime. 90 mL 3  . insulin lispro (HUMALOG KWIKPEN) 100 UNIT/ML KiwkPen Inject 0.15 mLs (15 Units total) into the skin 3 (three) times daily. 45 mL 3  . Insulin Pen Needle 31G X 8 MM MISC Inject 1 each into the skin 4 (four) times daily. Check blood sugar TID and QHS 120 each 11  . lisinopril-hydrochlorothiazide (ZESTORETIC) 20-12.5 MG tablet Take 2 tablets by mouth daily. 60 tablet 3  . metoCLOPramide (REGLAN) 10 MG tablet TAKE 1 TABLET BY MOUTH 3 TIMES DAILY BEFORE MEALS AS NEEDED 90 tablet 5  . nortriptyline (PAMELOR) 25 MG capsule Take 25 mg before bedtime for one week, then 50 mg before bedtime 60 capsule 2  . Pancrelipase, Lip-Prot-Amyl, (ZENPEP) 20000-63000 units CPEP Take 1 capsule by mouth 3 (three) times daily before meals. 180 capsule 2  . pantoprazole (PROTONIX) 40 MG tablet Take 1 tablet (40 mg total) by mouth 2 (two) times daily. 60 tablet 5  . promethazine (PHENERGAN) 25 MG tablet TAKE 1 TABLET BY MOUTH EVERY 8 HOURS AS NEEDED FOR NAUSEA OR VOMITING. 60 tablet 0  . sucralfate (CARAFATE) 1 g tablet Take 1 tablet (1 g total) by mouth 4 (four) times daily -  with meals and at bedtime. 120 tablet 5  . TRUEPLUS LANCETS 28G MISC 1 each by Does not apply route 4 (four) times daily -  before meals and at bedtime. 100 each 12  . Vitamin D, Ergocalciferol, (DRISDOL) 50000 units CAPS capsule Take 1 capsule (50,000 Units total) by mouth every 7 (seven) days. For 12 weeks 4 capsule 2  . tadalafil 2.5 MG TABS Take 1 tablet (2.5 mg total) by mouth daily. (Patient not taking: Reported on 07/10/2016) 30 tablet 5   No facility-administered medications prior to visit.     ROS Review of Systems  Constitutional: Negative for chills, fatigue, fever and unexpected weight change.  Eyes: Positive for visual disturbance.  Respiratory: Negative for cough and shortness of breath.   Cardiovascular: Negative for chest pain, palpitations and leg swelling.  Gastrointestinal:  Positive for diarrhea. Negative for abdominal pain, blood in stool, constipation, nausea and vomiting.       Excessive gas   Endocrine: Positive for heat intolerance (excessive sweating ). Negative for polydipsia, polyphagia and polyuria.  Musculoskeletal: Positive for arthralgias, joint swelling and myalgias (myalgia in thighs, flanks and abdomen). Negative for back pain, gait problem and neck pain.  Skin: Negative for rash.  Allergic/Immunologic: Negative for immunocompromised state.  Hematological: Negative for adenopathy. Does not bruise/bleed easily.  Psychiatric/Behavioral: Positive for dysphoric mood and sleep disturbance. Negative for suicidal ideas. The patient is nervous/anxious.     Objective:  BP (!) 153/88   Pulse 95   Temp 98.5 F (36.9 C) (Oral)   Ht 6' 3"  (1.905 m)   Wt (!) 333 lb 12.8 oz (151.4 kg)   SpO2 99%  BMI 41.72 kg/m   BP/Weight 09/03/2016 08/20/2016 0/98/1191  Systolic BP 478 295 621  Diastolic BP 88 99 89  Wt. (Lbs) 333.8 338.2 340.4  BMI 41.72 42.27 42.55   Wt Readings from Last 3 Encounters:  09/03/16 (!) 333 lb 12.8 oz (151.4 kg)  08/20/16 (!) 338 lb 3.2 oz (153.4 kg)  07/10/16 (!) 340 lb 6.4 oz (154.4 kg)   Pulse Readings from Last 3 Encounters:  09/03/16 95  08/20/16 (!) 109  07/10/16 94   Physical Exam  Constitutional: He appears well-developed and well-nourished. No distress.  HENT:  Head: Normocephalic and atraumatic.  Neck: Normal range of motion. Neck supple.  Cardiovascular: Normal rate, regular rhythm, normal heart sounds and intact distal pulses.   Pulmonary/Chest: Effort normal and breath sounds normal.  Abdominal: Soft. Bowel sounds are normal. He exhibits no distension and no mass. There is no tenderness. There is no rebound and no guarding.  Musculoskeletal: He exhibits edema (B/l 1 + on R, trace on L ).  Neurological: He is alert.  Skin: Skin is warm and dry. No rash noted. No erythema.  Psychiatric: He has a normal mood and  affect.    Lab Results  Component Value Date   HGBA1C 11.4 07/10/2016   CBG 248  Depression screen [facility] 2/9 08/20/2016 07/10/2016 06/15/2016  Decreased Interest 2 2 2   Down, Depressed, Hopeless 2 3 3   PHQ - 2 Score 4 5 5   Altered sleeping 3 3 2   Tired, decreased energy 2 2 2   Change in appetite 2 3 2   Feeling bad or failure about yourself  2 2 2   Trouble concentrating 1 1 2   Moving slowly or fidgety/restless 1 1 2   Suicidal thoughts 1 1 2   PHQ-9 Score 16 18 19   Some recent data might be hidden   GAD 7 : Generalized Anxiety Score 08/20/2016 07/10/2016 06/15/2016 03/30/2016  Nervous, Anxious, on Edge 2 1 1 1   Control/stop worrying 2 3 3 2   Worry too much - different things 2 2 2 3   Trouble relaxing 1 2 2 2   Restless 1 1 2 1   Easily annoyed or irritable 1 1 1 2   Afraid - awful might happen 1 2 2 2   Total GAD 7 Score 10 12 13 13       Assessment & Plan:  Zamauri was seen today for follow-up.  Diagnoses and all orders for this visit:  Type 1 diabetes mellitus with diabetic polyneuropathy (HCC) -     POCT glucose (manual entry) -     Insulin Glargine (LANTUS SOLOSTAR) 100 UNIT/ML Solostar Pen; Inject 55 Units into the skin at bedtime. -     insulin lispro (HUMALOG KWIKPEN) 100 UNIT/ML KiwkPen; Inject 0.15 mLs (15 Units total) into the skin 3 (three) times daily.  Leg swelling -     torsemide (DEMADEX) 20 MG tablet; Take 1 tablet (20 mg total) by mouth daily.  Chronic diarrhea -     Pancrelipase, Lip-Prot-Amyl, (ZENPEP) 20000-63000 units CPEP; Take 2 capsules by mouth 3 (three) times daily before meals. Via PASS    No orders of the defined types were placed in this encounter.   Follow-up: Return in about 4 weeks (around 10/01/2016) for diabetes, HTN and chronic diarrhea.   Boykin Nearing MD

## 2016-09-03 NOTE — Assessment & Plan Note (Signed)
A: hypertension P: Change lasix 40 to torsemide 20 mg daily to hopefully reduce BP and improve leg swelling

## 2016-09-03 NOTE — Assessment & Plan Note (Signed)
Chronic diarrhea with uncontrolled type 1 diabetes Fatty stools Worsened by high fat diet Suspect exocrine pancreatic insufficiency Stool sample given today for C . Diff and stool culture  And fecal pancreatic elastase zenpep ordered, patient to doble dose to 2 capsules with meals He has outstanding balance with [facility]. Advised to address this so he can schedule a f/u appt.

## 2016-09-05 ENCOUNTER — Telehealth: Payer: Self-pay

## 2016-09-05 NOTE — Telephone Encounter (Signed)
Pt was called and informed of all lab results.

## 2016-09-06 ENCOUNTER — Ambulatory Visit (INDEPENDENT_AMBULATORY_CARE_PROVIDER_SITE_OTHER): Payer: Medicare Other | Admitting: Orthopedic Surgery

## 2016-09-06 ENCOUNTER — Other Ambulatory Visit: Payer: Self-pay

## 2016-09-06 DIAGNOSIS — K529 Noninfective gastroenteritis and colitis, unspecified: Secondary | ICD-10-CM

## 2016-09-06 MED ORDER — PANCRELIPASE (LIP-PROT-AMYL) 20000-63000 UNITS PO CPEP: 2.0000 | ORAL_CAPSULE | Freq: Three times a day (TID) | ORAL | 3 refills | Status: DC

## 2016-09-13 MED FILL — $HUMALOG 100 UNITS/ML KWIKP: 100 | 33 days supply | Qty: 15 | Fill #0

## 2016-09-13 MED FILL — NORTRIPTYLINE HCL 25 MG CAP: 25 | 30 days supply | Qty: 60 | Fill #1

## 2016-09-13 MED FILL — GABAPENTIN 300 MG CAPSULE: 300 | 30 days supply | Qty: 30 | Fill #2

## 2016-09-14 MED FILL — TORSEMIDE 20 MG TABLET: 20 | 30 days supply | Qty: 30 | Fill #0

## 2016-09-14 MED FILL — !LANTUS SOLOSTAR 100UNITS/M: 100 | 32 days supply | Qty: 18 | Fill #0

## 2016-09-18 MED FILL — LISINOPRIL-HCTZ 20-12.5 MG: 20-12.5 | 30 days supply | Qty: 60 | Fill #3

## 2016-09-19 ENCOUNTER — Ambulatory Visit: Payer: Medicare Other | Admitting: Podiatry

## 2016-10-01 MED FILL — DILTIAZEM 24HR ER 180 MG CA: 180 | 30 days supply | Qty: 30 | Fill #3

## 2016-10-01 MED FILL — !ZENPEP 20000-63000 UNIT CA: 20000-63000 | 33 days supply | Qty: 100 | Fill #1

## 2016-10-01 MED FILL — METOCLOPRAMIDE 10 MG TABLET: 10 | 30 days supply | Qty: 90 | Fill #0

## 2016-10-02 DIAGNOSIS — E113313 Type 2 diabetes mellitus with moderate nonproliferative diabetic retinopathy with macular edema, bilateral: Secondary | ICD-10-CM | POA: Diagnosis not present

## 2016-10-04 ENCOUNTER — Encounter (INDEPENDENT_AMBULATORY_CARE_PROVIDER_SITE_OTHER): Payer: Self-pay | Admitting: Orthopedic Surgery

## 2016-10-04 ENCOUNTER — Ambulatory Visit (INDEPENDENT_AMBULATORY_CARE_PROVIDER_SITE_OTHER): Payer: Medicare Other

## 2016-10-04 ENCOUNTER — Ambulatory Visit (INDEPENDENT_AMBULATORY_CARE_PROVIDER_SITE_OTHER): Payer: Medicare Other | Admitting: Orthopedic Surgery

## 2016-10-04 DIAGNOSIS — M25552 Pain in left hip: Secondary | ICD-10-CM

## 2016-10-04 DIAGNOSIS — M25551 Pain in right hip: Secondary | ICD-10-CM | POA: Diagnosis not present

## 2016-10-04 DIAGNOSIS — M25562 Pain in left knee: Secondary | ICD-10-CM

## 2016-10-04 DIAGNOSIS — G8929 Other chronic pain: Secondary | ICD-10-CM

## 2016-10-07 NOTE — Progress Notes (Signed)
Office Visit Note   Patient: Duane [name]           Date of Birth: 07-Mar-1975           MRN: [id number] Visit Date: 10/04/2016 Requested by: Boykin Nearing, MD [address] [place], [place] [id number] PCP: Boykin Nearing, MD  Subjective: Chief Complaint  Patient presents with  . Right Hip - Pain  . Left Hip - Pain  . Left Knee - Pain    HPI: Duane [name] is a 41 year old patient with left knee pain and bilateral hip pain as well as bilateral shoulder pain.  In regards to the left knee he describes chronic pain with prior arthroscopy in 2005 with clinically repaired anterior cruciate ligament and MCL.  He had rerupture of the graft one year later when he began playing indoor football.  He has not had it re-repaired since that time.  He does report swelling weakness and giving way in that left knee with some locking and popping and grinding.  He wears a brace for support without much relief.  Sometimes he feels instability in the left knee.  In questioning him it sounds like this was allograft tissue that was used.  He is currently disabled.  He has diabetes and hypertension and states that his disability is primarily related to diabetic Castro paresis.  He also states that he does do some standing for part-time work.  Patient also reports bilateral hip pain right equal to left.  Has burning sensation in the groin for the past 3-4 years which is worse with walking and standing.  He associates this also with some degree of back pain.  Patient also describes bilateral shoulder pain left greater than right with pain with increased activity and popping and clicking.  He is right-hand dominant.  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              ROS: All systems reviewed are negative as they relate to the chief complaint within the history of present illness.  Patient denies  fevers or chills.   Assessment & Plan: Visit Diagnoses:  1. Bilateral hip pain   2. Chronic pain  of left knee     Plan: Impression is left knee pain with likely recurrent anterior cruciate ligament tear and some degree of arthritis present.  I discussed with him at length revision anterior cruciate ligament reconstruction and how that is designed primarily to treat instability and not pain.  He is having a component of both in regards to that left knee.  I am going to aspirate and inject the knee today for pain relief.  He will watch his blood glucose over the next several days.  In regards to further workup I think MRI scan is indicated to evaluate primarily the status of any occult arthritis that may be present in the knee so we can have the best discussion possible about setting expectations for revision anterior cruciate ligament surgery.  He has been told that he would have to have the toenails bone grafted the stage procedure for the surgery.  Not sure that's necessary particularly with flip cutter technology available.  In regards to the hip pain he does not have much in the way of definite joint space narrowing on radiographs.  I think injections in the hips would be potentially indicated after we get his knee workup.  In regards to the shoulders I think that he may have some rotator cuff and labral pathology  but we will work that up further neck clinic visit with x-rays and further physical exam.  Follow-Up Instructions: Return for after MRI.   Orders:  Orders Placed This Encounter  Procedures  . XR HIPS BILAT W OR W/O PELVIS 3-4 VIEWS  . XR KNEE 3 VIEW LEFT  . MR Knee Left w/o contrast   No orders of the defined types were placed in this encounter.     Procedures: No procedures performed   Clinical Data: No additional findings.  Objective: Vital Signs: There were no vitals taken for this visit.  Physical Exam:   Constitutional: Patient appears well-developed HEENT:  Head: Normocephalic Eyes:EOM are normal Neck: Normal range of motion Cardiovascular: Normal  rate Pulmonary/chest: Effort normal Neurologic: Patient is alert Skin: Skin is warm Psychiatric: Patient has normal mood and affect    Ortho Exam: Orthopedic exam demonstrates good cervical spine range of motion.  He has pretty reasonable rotator cuff strength and negative apprehension relocation testing bilaterally but there is a little bit of coarseness with passive range of motion above 90 of abduction bilaterally.  Not much in the way of acromioclavicular joint tenderness.  Impingement signs equivocal bilaterally  Both hips are examined.  He does have mild trochanteric tenderness but no groin pain with internal/external rotation of either hip.  No significant asymmetric restriction of internal or external rotation.  Hip flexion abduction strength is intact and symmetric.  Left knee is examined.  He does have reasonable range of motion but good collateral stability to varus and valgus stress at 0 and 30.  Anterior cruciate ligament does feel loose.  PCL feels intact.  No definite posterior lateral rotatory instability noted but that would need to be reexamined after the MRI scan.  Specialty Comments:  No specialty comments available.  Imaging: No results found.   PMFS History: Patient Active Problem List   Diagnosis Date Noted  . Vitamin D deficiency 08/21/2016  . Leg swelling 08/21/2016  . Excessive sweating 08/21/2016  . Status post cholecystectomy 10/16/2015  . Onychomycosis of toenail 04/22/2015  . Erectile dysfunction 04/22/2015  . Diabetic gastroparesis associated with type 1 diabetes mellitus ([place]) 01/01/2015  . Skin tag 09/17/2014  . Pain and swelling of left knee 09/13/2014  . Chronic diarrhea 09/13/2014  . Benign paroxysmal positional vertigo 07/15/2014  . MDD (major depressive disorder), single episode, severe , no psychosis ([place]) 05/19/2014  . GERD (gastroesophageal reflux disease) 05/05/2014  . Diabetic neuropathy ([place]) 08/28/2013  . DM type 1 (diabetes mellitus,  type 1) ([place]) 01/09/2013  . HTN (hypertension) 01/09/2013   Past Medical History:  Diagnosis Date  . Depression   . Diabetes mellitus without complication [facility]) age 37   insulin requiring from the start  . Diabetic neuropathy ([place]) Dx 20110  . Gastritis   . Gastroparesis 2012   100% gastric retention on 08/2013 [facility]  . GERD (gastroesophageal reflux disease) Dx 2012  . Hypertension Dx 2012  . Obesity     Family History  Problem Relation Age of Onset  . Diabetes Father   . Diabetes Maternal Grandmother   . Diabetes Maternal Grandfather     Past Surgical History:  Procedure Laterality Date  . CHOLECYSTECTOMY  2013  . ESOPHAGOGASTRODUODENOSCOPY N/A 06/11/2013   Procedure: ESOPHAGOGASTRODUODENOSCOPY (EGD);  Surgeon: [name], MD;  Location: [facility];  Service: Endoscopy;  Laterality: N/A;  . FLEXIBLE SIGMOIDOSCOPY N/A 06/11/2013   Procedure: FLEXIBLE SIGMOIDOSCOPY;  Surgeon: [name], MD;  Location: [facility];  Service:  Endoscopy;  Laterality: N/A;  . KNEE SURGERY Left    Social History   Occupational History  . 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    Social History Main Topics  . Smoking status: Never Smoker  . Smokeless tobacco: Never Used  . Alcohol use No  . Drug use: No  . Sexual activity: Not on file

## 2016-10-13 ENCOUNTER — Other Ambulatory Visit: Payer: Self-pay | Admitting: Family Medicine

## 2016-10-13 DIAGNOSIS — E0843 Diabetes mellitus due to underlying condition with diabetic autonomic (poly)neuropathy: Secondary | ICD-10-CM

## 2016-10-13 MED FILL — GABAPENTIN 300 MG CAPSULE: 300 | 30 days supply | Qty: 30 | Fill #3

## 2016-10-16 ENCOUNTER — Encounter: Payer: Self-pay | Admitting: Family Medicine

## 2016-10-16 ENCOUNTER — Ambulatory Visit: Payer: Medicare Other | Attending: Family Medicine | Admitting: Family Medicine

## 2016-10-16 VITALS — BP 149/65 | HR 81 | Temp 98.0°F | Ht 75.0 in | Wt 340.8 lb

## 2016-10-16 DIAGNOSIS — N183 Chronic kidney disease, stage 3 unspecified: Secondary | ICD-10-CM

## 2016-10-16 DIAGNOSIS — Z9049 Acquired absence of other specified parts of digestive tract: Secondary | ICD-10-CM | POA: Insufficient documentation

## 2016-10-16 DIAGNOSIS — E1065 Type 1 diabetes mellitus with hyperglycemia: Secondary | ICD-10-CM | POA: Insufficient documentation

## 2016-10-16 DIAGNOSIS — E1022 Type 1 diabetes mellitus with diabetic chronic kidney disease: Secondary | ICD-10-CM | POA: Insufficient documentation

## 2016-10-16 DIAGNOSIS — K529 Noninfective gastroenteritis and colitis, unspecified: Secondary | ICD-10-CM | POA: Diagnosis not present

## 2016-10-16 DIAGNOSIS — E1042 Type 1 diabetes mellitus with diabetic polyneuropathy: Secondary | ICD-10-CM

## 2016-10-16 DIAGNOSIS — E1043 Type 1 diabetes mellitus with diabetic autonomic (poly)neuropathy: Secondary | ICD-10-CM

## 2016-10-16 DIAGNOSIS — Z79899 Other long term (current) drug therapy: Secondary | ICD-10-CM | POA: Diagnosis not present

## 2016-10-16 DIAGNOSIS — I129 Hypertensive chronic kidney disease with stage 1 through stage 4 chronic kidney disease, or unspecified chronic kidney disease: Secondary | ICD-10-CM | POA: Insufficient documentation

## 2016-10-16 DIAGNOSIS — I10 Essential (primary) hypertension: Secondary | ICD-10-CM | POA: Diagnosis not present

## 2016-10-16 DIAGNOSIS — R600 Localized edema: Secondary | ICD-10-CM | POA: Diagnosis not present

## 2016-10-16 DIAGNOSIS — K3184 Gastroparesis: Secondary | ICD-10-CM

## 2016-10-16 DIAGNOSIS — E1143 Type 2 diabetes mellitus with diabetic autonomic (poly)neuropathy: Secondary | ICD-10-CM

## 2016-10-16 DIAGNOSIS — F329 Major depressive disorder, single episode, unspecified: Secondary | ICD-10-CM | POA: Diagnosis not present

## 2016-10-16 DIAGNOSIS — M7989 Other specified soft tissue disorders: Secondary | ICD-10-CM

## 2016-10-16 DIAGNOSIS — K219 Gastro-esophageal reflux disease without esophagitis: Secondary | ICD-10-CM | POA: Diagnosis not present

## 2016-10-16 DIAGNOSIS — Z794 Long term (current) use of insulin: Secondary | ICD-10-CM | POA: Diagnosis not present

## 2016-10-16 DIAGNOSIS — N189 Chronic kidney disease, unspecified: Secondary | ICD-10-CM | POA: Insufficient documentation

## 2016-10-16 LAB — GLUCOSE, POCT (MANUAL RESULT ENTRY): POC GLUCOSE: 173 mg/dL — AB (ref 70–99)

## 2016-10-16 LAB — POCT GLYCOSYLATED HEMOGLOBIN (HGB A1C): Hemoglobin A1C: 10.7

## 2016-10-16 MED ORDER — PANTOPRAZOLE SODIUM 40 MG PO TBEC: 40.0000 mg | DELAYED_RELEASE_TABLET | Freq: Two times a day (BID) | ORAL | 5 refills | Status: DC

## 2016-10-16 MED ORDER — POTASSIUM CHLORIDE CRYS ER 10 MEQ PO TBCR: 10.0000 meq | EXTENDED_RELEASE_TABLET | Freq: Every day | ORAL | 3 refills | Status: DC

## 2016-10-16 MED ORDER — FUROSEMIDE 40 MG PO TABS: 40.0000 mg | ORAL_TABLET | Freq: Every day | ORAL | 3 refills | Status: DC

## 2016-10-16 MED ORDER — LISINOPRIL-HYDROCHLOROTHIAZIDE 20-12.5 MG PO TABS: 2.0000 | ORAL_TABLET | Freq: Every day | ORAL | 3 refills | Status: DC

## 2016-10-16 MED FILL — POTASSIUM CL 10 MEQ TAB SA: 10 | 30 days supply | Qty: 30 | Fill #0

## 2016-10-16 MED FILL — LISINOPRIL-HCTZ 20-12.5 MG: 20-12.5 | 30 days supply | Qty: 60 | Fill #0

## 2016-10-16 MED FILL — CHOLESTYRAMINE PACKET: 4 | 20 days supply | Qty: 60 | Fill #2

## 2016-10-16 MED FILL — ?PANTOPRAZOLE SOD DR 40MG: 40 MG | 30 days supply | Qty: 60 | Fill #0

## 2016-10-16 NOTE — Progress Notes (Signed)
Subjective:  Patient ID: Duane [name], male    DOB: 17-Jun-1975  Age: 41 y.o. MRN: [id number]  CC: Diabetes and Hypertension   HPI Duane [name] is a 41 year old male with history of type 1 diabetes mellitus (A1c 10.7), diabetic neuropathy, diabetic gastroparesis hypertension, stage III chronic kidney disease who presents today to get established with me as he was previously followed by Dr. Adrian Blackwater.  At his last visit he was placed on Zenpep for chronic diarrhea but he reports no much improvement in symptoms. His diarrhea is more in the morning with associated gas and bloating but he denies symptoms preceded by abdominal cramping. Diarrhea occurs 4-5 times a day. Denies presence of blood in stool and modification of his diet has not helped. Also taking cholestyramine and fiber supplements. Referred to [facility] which he was unable to see due to previously owed balance.  He takes Reglan for gastroparesis.  He complains of pedal edema which is worse at the end of the day despite using compression stockings and taking torsemide. He was previously on furosemide which was changed to torsemide by his PCP.  He was recently seen by [facility] orthopedic where he received left knee cortisone injection last month due to left knee pain and possible anterior cruciate ligament injury. He does have an upcoming MRI scheduled.  Past Medical History:  Diagnosis Date  . Depression   . Diabetes mellitus without complication [facility]) age 27   insulin requiring from the start  . Diabetic neuropathy ([place]) Dx 20110  . Gastritis   . Gastroparesis 2012   100% gastric retention on 08/2013 GES  . GERD (gastroesophageal reflux disease) Dx 2012  . Hypertension Dx 2012  . Obesity     Past Surgical History:  Procedure Laterality Date  . CHOLECYSTECTOMY  2013  . ESOPHAGOGASTRODUODENOSCOPY N/A 06/11/2013   Procedure: ESOPHAGOGASTRODUODENOSCOPY (EGD);  Surgeon: [name], MD;  Location: [facility];  Service:  Endoscopy;  Laterality: N/A;  . FLEXIBLE SIGMOIDOSCOPY N/A 06/11/2013   Procedure: FLEXIBLE SIGMOIDOSCOPY;  Surgeon: [name], MD;  Location: [facility];  Service: Endoscopy;  Laterality: N/A;  . KNEE SURGERY Left     No Known Allergies   Outpatient Medications Prior to Visit  Medication Sig Dispense Refill  . Blood Glucose Monitoring Suppl (TRUE METRIX METER) W/DEVICE KIT 1 each by Does not apply route 4 (four) times daily -  before meals and at bedtime. 1 kit ea  . cholestyramine (QUESTRAN) 4 g packet Take 1 packet (4 g total) by mouth 3 (three) times daily with meals. 90 each 12  . diltiazem (CARDIZEM CD) 180 MG 24 hr capsule Take 1 capsule (180 mg total) by mouth daily. 30 capsule 5  . gabapentin (NEURONTIN) 300 MG capsule Take 1 capsule (300 mg total) by mouth at bedtime. 30 capsule 3  . glucose blood (TRUE METRIX BLOOD GLUCOSE TEST) test strip 1 each by Other route 3 (three) times daily. 100 each 11  . Insulin Glargine (LANTUS SOLOSTAR) 100 UNIT/ML Solostar Pen Inject 55 Units into the skin at bedtime. 90 mL 3  . insulin lispro (HUMALOG KWIKPEN) 100 UNIT/ML KiwkPen Inject 0.15 mLs (15 Units total) into the skin 3 (three) times daily. 45 mL 3  . Insulin Pen Needle 31G X 8 MM MISC Inject 1 each into the skin 4 (four) times daily. Check blood sugar TID and QHS 120 each 11  . lisinopril-hydrochlorothiazide (ZESTORETIC) 20-12.5 MG tablet Take 2 tablets by mouth daily. 60 tablet 3  .  metoCLOPramide (REGLAN) 10 MG tablet TAKE 1 TABLET BY MOUTH 3 TIMES DAILY BEFORE MEALS AS NEEDED 90 tablet 5  . nortriptyline (PAMELOR) 25 MG capsule Take 25 mg before bedtime for one week, then 50 mg before bedtime 60 capsule 2  . Pancrelipase, Lip-Prot-Amyl, (ZENPEP) 20000-63000 units CPEP Take 2 capsules by mouth 3 (three) times daily before meals. Via PASS 1350 capsule 3  . pantoprazole (PROTONIX) 40 MG tablet Take 1 tablet (40 mg total) by mouth 2 (two) times daily. 60 tablet 5  . promethazine  (PHENERGAN) 25 MG tablet TAKE 1 TABLET BY MOUTH EVERY 8 HOURS AS NEEDED FOR NAUSEA OR VOMITING. 60 tablet 0  . sucralfate (CARAFATE) 1 g tablet Take 1 tablet (1 g total) by mouth 4 (four) times daily -  with meals and at bedtime. 120 tablet 5  . TRUEPLUS LANCETS 28G MISC 1 each by Does not apply route 4 (four) times daily -  before meals and at bedtime. 100 each 12  . torsemide (DEMADEX) 20 MG tablet Take 1 tablet (20 mg total) by mouth daily. 30 tablet 1  . famotidine (PEPCID) 20 MG tablet Take 1 tablet (20 mg total) by mouth 2 (two) times daily. (Patient not taking: Reported on 10/16/2016) 30 tablet 0  . tadalafil 2.5 MG TABS Take 1 tablet (2.5 mg total) by mouth daily. (Patient not taking: Reported on 10/16/2016) 30 tablet 5  . Vitamin D, Ergocalciferol, (DRISDOL) 50000 units CAPS capsule Take 1 capsule (50,000 Units total) by mouth every 7 (seven) days. For 12 weeks (Patient not taking: Reported on 10/16/2016) 4 capsule 2   No facility-administered medications prior to visit.     ROS Review of Systems  Constitutional: Negative for activity change and appetite change.  HENT: Negative for sinus pressure and sore throat.   Eyes: Negative for visual disturbance.  Respiratory: Negative for cough, chest tightness, shortness of breath and wheezing.   Cardiovascular: Negative for chest pain, palpitations and leg swelling.  Gastrointestinal:       See hpi  Endocrine: Negative.   Genitourinary: Negative.  Negative for dysuria.  Musculoskeletal: Negative.  Negative for joint swelling and myalgias.  Skin: Negative for rash.  Allergic/Immunologic: Negative.   Neurological: Negative for weakness, light-headedness and numbness.  Psychiatric/Behavioral: Negative for behavioral problems, dysphoric mood and suicidal ideas.    Objective:  BP (!) 149/65   Pulse 81   Temp 98 F (36.7 C) (Oral)   Ht 6' 3"  (1.905 m)   Wt (!) 340 lb 12.8 oz (154.6 kg)   SpO2 99%   BMI 42.60 kg/m   BP/Weight 10/16/2016  8/34/1961 03/16/9796  Systolic BP 921 194 174  Diastolic BP 65 88 99  Wt. (Lbs) 340.8 333.8 338.2  BMI 42.6 41.72 42.27      Physical Exam  Constitutional: He is oriented to person, place, and time. He appears well-developed and well-nourished.  Obese  Cardiovascular: Normal rate, normal heart sounds and intact distal pulses.   No murmur heard. Pulmonary/Chest: Effort normal and breath sounds normal. He has no wheezes. He has no rales. He exhibits no tenderness.  Abdominal: Soft. Bowel sounds are normal. He exhibits no distension and no mass. There is no tenderness.  Musculoskeletal: Normal range of motion. He exhibits edema (2+ nonpitting bilateral pedal edema).  Neurological: He is alert and oriented to person, place, and time.  Skin: Skin is warm and dry.  Psychiatric: He has a normal mood and affect.     Lab Results  Component Value Date   HGBA1C 10.7 10/16/2016    Assessment & Plan:   1. Type 1 diabetes mellitus with diabetic polyneuropathy (HCC) Uncontrolled with A1c of 10.7 Encouraged to take 55 units of Lantus which appears on his med list rather than 45 which has been taking. Encouraged to up titrate Lantus by 2 units every fourth day until blood sugars are at goal Recent intra-articular steroid injection could also be causing hyperglycemia Diabetic diet. Keep blood sugar logs with fasting goals of 80-120 mg/dl, random of less than 180 and in the event of sugars less than 60 mg/dl or greater than 400 mg/dl please notify the clinic ASAP. It is recommended that you undergo annual eye exams and annual foot exams. Pneumovax is recommended every 5 years before the age of 74 and once for a lifetime at or after the age of 93. - POCT glucose (manual entry) - POCT glycosylated hemoglobin (Hb A1C)  2. Chronic diarrhea Currently on Zenpep with no improvement in symptoms Faecal fat ordered and patient to come in and drop stool specimen Symptoms suspicious for IBS and would  love to place him on Bentyl which he is hesitant about - Ambulatory referral to Gastroenterology  3. Pedal edema Dependent edema We have discussed the need to exercise caution given stage III CKD Advised to use compression stockings - furosemide (LASIX) 40 MG tablet; Take 1 tablet (40 mg total) by mouth daily.  Dispense: 30 tablet; Refill: 3 - potassium chloride SA (K-DUR,KLOR-CON) 10 MEQ tablet; Take 1 tablet (10 mEq total) by mouth daily.  Dispense: 30 tablet; Refill: 3  4. Essential hypertension Uncontrolled Continue antihypertensive We'll work on lifestyle modifications, low-sodium, DASH diet If blood pressure remains elevated at next visit, regimen changes will be initiated.  5. Diabetic gastroparesis associated with type 1 diabetes mellitus (HCC) Continue Reglan  6. Stage 3 CKD Avoid nephrotoxins Caution with diuretics  Meds ordered this encounter  Medications  . furosemide (LASIX) 40 MG tablet    Sig: Take 1 tablet (40 mg total) by mouth daily.    Dispense:  30 tablet    Refill:  3    Discontinue Torsemide  . potassium chloride SA (K-DUR,KLOR-CON) 10 MEQ tablet    Sig: Take 1 tablet (10 mEq total) by mouth daily.    Dispense:  30 tablet    Refill:  3    Follow-up: Return in about 3 months (around 01/15/2017) for Follow-up on diabetes mellitus.   Arnoldo Morale MD

## 2016-10-17 MED FILL — TRUE METRIX TEST STRIP: 33 days supply | Qty: 100 | Fill #1

## 2016-10-22 ENCOUNTER — Other Ambulatory Visit: Payer: Self-pay

## 2016-10-23 ENCOUNTER — Ambulatory Visit
Admission: RE | Admit: 2016-10-23 | Discharge: 2016-10-23 | Disposition: A | Discharge status: Home / Self Care | Payer: Medicare Other | Source: Ambulatory Visit | Attending: Orthopedic Surgery | Admitting: Orthopedic Surgery

## 2016-10-23 DIAGNOSIS — G8929 Other chronic pain: Secondary | ICD-10-CM

## 2016-10-23 DIAGNOSIS — M25562 Pain in left knee: Secondary | ICD-10-CM | POA: Diagnosis not present

## 2016-11-01 MED FILL — DILTIAZEM 24HR ER 180 MG CA: 180 | 30 days supply | Qty: 30 | Fill #4

## 2016-11-02 MED FILL — SUCRALFATE 1 GM TABLET: 1 | 30 days supply | Qty: 120 | Fill #3

## 2016-11-02 MED FILL — !ZENPEP 20000-63000 UNIT CA: 20000-63000 | 33 days supply | Qty: 100 | Fill #2

## 2016-11-12 ENCOUNTER — Other Ambulatory Visit: Payer: Self-pay | Admitting: Family Medicine

## 2016-11-12 DIAGNOSIS — E0843 Diabetes mellitus due to underlying condition with diabetic autonomic (poly)neuropathy: Secondary | ICD-10-CM

## 2016-11-12 MED FILL — NORTRIPTYLINE HCL 25 MG CAP: 25 | 30 days supply | Qty: 60 | Fill #2

## 2016-11-12 MED FILL — METOCLOPRAMIDE 10 MG TABLET: 10 | 30 days supply | Qty: 90 | Fill #1

## 2016-11-13 MED FILL — GABAPENTIN 300 MG CAPSULE: 300 | 30 days supply | Qty: 30 | Fill #0

## 2016-11-21 MED FILL — $HUMALOG 100 UNITS/ML KWIKP: 100 | 33 days supply | Qty: 15 | Fill #1

## 2016-11-21 MED FILL — ?PANTOPRAZOLE SOD DR 40MG: 40 MG | 30 days supply | Qty: 60 | Fill #1

## 2016-11-21 MED FILL — ?POTASSIUM CL ER 10MEQ TAB: 10 | 30 days supply | Qty: 30 | Fill #1

## 2016-11-21 MED FILL — !LANTUS SOLOSTAR 100UNITS/M: 100 | 32 days supply | Qty: 18 | Fill #1

## 2016-11-22 ENCOUNTER — Encounter: Payer: Self-pay | Admitting: Physician Assistant

## 2016-11-23 MED FILL — LISINOPRIL-HCTZ 20-12.5 MG: 20-12.5 | 30 days supply | Qty: 60 | Fill #1

## 2016-11-29 ENCOUNTER — Ambulatory Visit (INDEPENDENT_AMBULATORY_CARE_PROVIDER_SITE_OTHER): Payer: Medicare Other | Admitting: Orthopedic Surgery

## 2016-11-29 ENCOUNTER — Encounter (INDEPENDENT_AMBULATORY_CARE_PROVIDER_SITE_OTHER): Payer: Self-pay | Admitting: Orthopedic Surgery

## 2016-11-29 DIAGNOSIS — M25562 Pain in left knee: Secondary | ICD-10-CM

## 2016-11-29 DIAGNOSIS — G8929 Other chronic pain: Secondary | ICD-10-CM | POA: Diagnosis not present

## 2016-11-29 MED FILL — !ZENPEP 20000-63000 UNIT CA: 20000-63000 | 33 days supply | Qty: 100 | Fill #3

## 2016-12-01 NOTE — Progress Notes (Signed)
Office Visit Note   Patient: Duane [name]           Date of Birth: 1975-05-21           MRN: [id number] Visit Date: 11/29/2016 Requested by: Arnoldo Morale, MD [place], [place] [id number] PCP: Arnoldo Morale, MD  Subjective: Chief Complaint  Patient presents with  . Left Knee - Follow-up    HPI: Duane [name] is a 41 year old patient with left knee pain.  Since I have seen him he has had an MRI scan.  That scan is reviewed with the patient.  He reports continued mechanical symptoms in the left knee.  MRI scan shows intact graft with large posterior loose body, lateral meniscal tear, and intact anterior cruciate ligament graft.the patient is considering surgical intervention.              ROS: All systems reviewed are negative as they relate to the chief complaint within the history of present illness.  Patient denies  fevers or chills.   Assessment & Plan: Visit Diagnoses:  1. Chronic pain of left knee     Plan: impression is large posterior loose body and flap tear of lateral meniscuswhich is likely giving the patient is symptomatic locking.  The anterior cruciate ligament graft looks intact on imaging and feels intact on the exam  It is too soon for another injection.  He will consider surgical option versus injection and come back later this year as needed.  The risk and benefits and rehabilitation following arthroscopy and debridement are discussed.  Follow-Up Instructions: Return if symptoms worsen or fail to improve.   Orders:  No orders of the defined types were placed in this encounter.  No orders of the defined types were placed in this encounter.     Procedures: No procedures performed   Clinical Data: No additional findings.  Objective: Vital Signs: There were no vitals taken for this visit.  Physical Exam:   Constitutional: Patient appears well-developed HEENT:  Head: Normocephalic Eyes:EOM are normal Neck: Normal range of  motion Cardiovascular: Normal rate Pulmonary/chest: Effort normal Neurologic: Patient is alert Skin: Skin is warm Psychiatric: Patient has normal mood and affect    Ortho Exam: orthopedic exam demonstrates active and passive range of motion of the left knee to be intact.  Anterior cruciate ligament   Trace effusion is present. Collateral and cruciate ligaments are stable.  No other masses lymph adenopathy or skin changes noted in the left knee region  Specialty Comments:  No specialty comments available.  Imaging: No results found.   PMFS History: Patient Active Problem List   Diagnosis Date Noted  . Pedal edema 10/16/2016  . Chronic kidney disease 10/16/2016  . Vitamin D deficiency 08/21/2016  . Leg swelling 08/21/2016  . Excessive sweating 08/21/2016  . Status post cholecystectomy 10/16/2015  . Onychomycosis of toenail 04/22/2015  . Erectile dysfunction 04/22/2015  . Diabetic gastroparesis associated with type 1 diabetes mellitus ([place]) 01/01/2015  . Skin tag 09/17/2014  . Pain and swelling of left knee 09/13/2014  . Chronic diarrhea 09/13/2014  . Benign paroxysmal positional vertigo 07/15/2014  . MDD (major depressive disorder), single episode, severe , no psychosis ([place]) 05/19/2014  . GERD (gastroesophageal reflux disease) 05/05/2014  . Diabetic neuropathy ([place]) 08/28/2013  . DM type 1 (diabetes mellitus, type 1) ([place]) 01/09/2013  . HTN (hypertension) 01/09/2013   Past Medical History:  Diagnosis Date  . Depression   . Diabetes mellitus without complication [facility]) age 6  insulin requiring from the start  . Diabetic neuropathy ([place]) Dx 20110  . Gastritis   . Gastroparesis 2012   100% gastric retention on 08/2013 [facility]  . GERD (gastroesophageal reflux disease) Dx 2012  . Hypertension Dx 2012  . Obesity     Family History  Problem Relation Age of Onset  . Diabetes Father   . Diabetes Maternal Grandmother   . Diabetes Maternal Grandfather     Past Surgical  History:  Procedure Laterality Date  . CHOLECYSTECTOMY  2013  . ESOPHAGOGASTRODUODENOSCOPY N/A 06/11/2013   Procedure: ESOPHAGOGASTRODUODENOSCOPY (EGD);  Surgeon: [name], MD;  Location: [facility];  Service: Endoscopy;  Laterality: N/A;  . FLEXIBLE SIGMOIDOSCOPY N/A 06/11/2013   Procedure: FLEXIBLE SIGMOIDOSCOPY;  Surgeon: [name], MD;  Location: [facility];  Service: Endoscopy;  Laterality: N/A;  . KNEE SURGERY Left    Social History   Occupational History  . 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    Social History Main Topics  . Smoking status: Never Smoker  . Smokeless tobacco: Never Used  . Alcohol use No  . Drug use: No  . Sexual activity: Not on file

## 2016-12-03 ENCOUNTER — Other Ambulatory Visit: Payer: Self-pay

## 2016-12-03 ENCOUNTER — Ambulatory Visit (HOSPITAL_COMMUNITY): Payer: Self-pay | Admitting: Psychiatry

## 2016-12-03 DIAGNOSIS — K529 Noninfective gastroenteritis and colitis, unspecified: Secondary | ICD-10-CM

## 2016-12-03 MED ORDER — PANCRELIPASE (LIP-PROT-AMYL) 20000-63000 UNITS PO CPEP: 2.0000 | ORAL_CAPSULE | Freq: Three times a day (TID) | ORAL | 3 refills | Status: DC

## 2016-12-04 MED FILL — DILTIAZEM 24HR ER 180 MG CA: 180 | 30 days supply | Qty: 30 | Fill #5

## 2016-12-07 MED FILL — CHOLESTYRAMINE PACKET: 4 | 20 days supply | Qty: 60 | Fill #3

## 2016-12-11 ENCOUNTER — Other Ambulatory Visit: Payer: Medicare Other

## 2016-12-11 ENCOUNTER — Encounter: Payer: Self-pay | Admitting: Nurse Practitioner

## 2016-12-11 ENCOUNTER — Ambulatory Visit (INDEPENDENT_AMBULATORY_CARE_PROVIDER_SITE_OTHER): Payer: Medicare Other | Admitting: Nurse Practitioner

## 2016-12-11 VITALS — BP 132/86 | HR 72 | Ht 76.0 in | Wt 343.1 lb

## 2016-12-11 DIAGNOSIS — K529 Noninfective gastroenteritis and colitis, unspecified: Secondary | ICD-10-CM

## 2016-12-11 DIAGNOSIS — K3184 Gastroparesis: Secondary | ICD-10-CM | POA: Diagnosis not present

## 2016-12-11 NOTE — Progress Notes (Addendum)
HPI:  Duane [name] is a 41 year old male with diabetic gastroparesis.  Patient says that over the years he has been in and out of the hospital probably 50 times for exacerbations of gastroparesis, many hospitalizations have been out of state but we have seen him a few times while hospitalized here in [place]. He had an EGD in 2015 Dr. Carlean Purl with findings of a moderate size hiatal hernia. He had 100% retention at 2 hours on GE in 2015.   Duane [name] is not really here to address gastroparesis which he has been managing fairly well with diet and Reglan. He is here to discuss worsening of his chronic postprandial diarrhea.  He had a cholecystectomy in 2012 but problems with diarrhea did not start until around 2015.  He has been on Questran 3-4 times a day for the last few years.  He goes through a bottle of Imodium every week or so. A few months ago his PCP started him on pancreatic enzymes, dose eventually doubled.   He has had some improvement but still complains of severe postprandial diarrhea. Rarely has a formed stool.  He has a "film" in the toilet after bowel movements . No rectal bleeding. No weight loss, he has in fact gained about 20 pounds over the last few months.  He has no cramping with the diarrhea, of note in April 2015 a CT scan suggested diffuse wall thickening involving the transverse colon consistent with colitis. Flexible sigmoidoscopy by Dr. Carlean Purl showed normal mucosa of the rectum, descending and transverse colon. He can't attribute worsening diarrhea to any of his medications. While Reglan can cause loose stools, he has been taking it for year.   His blood sugars have been poorly controlled. A1C in early Sept was 10.   Past Medical History:  Diagnosis Date  . Depression   . Diabetes mellitus without complication [facility]) age 43   insulin requiring from the start  . Diabetic neuropathy ([place]) Dx [address]10  . Gastritis   . Gastroparesis 2012   100% gastric retention on 08/2013 [facility]  .  GERD (gastroesophageal reflux disease) Dx 2012  . Hypertension Dx 2012  . Obesity      Past Surgical History:  Procedure Laterality Date  . CHOLECYSTECTOMY  2013  . ESOPHAGOGASTRODUODENOSCOPY N/A 06/11/2013   Procedure: ESOPHAGOGASTRODUODENOSCOPY (EGD);  Surgeon: [name], MD;  Location: [facility];  Service: Endoscopy;  Laterality: N/A;  . FLEXIBLE SIGMOIDOSCOPY N/A 06/11/2013   Procedure: FLEXIBLE SIGMOIDOSCOPY;  Surgeon: [name], MD;  [facility];  Service: Endoscopy;  Laterality: N/A;  . KNEE SURGERY Left    Family History  Problem Relation Age of Onset  . Diabetes Father   . Diabetes Maternal Grandmother   . Diabetes Maternal Grandfather    Social History  Substance Use Topics  . Smoking status: Never Smoker  . Smokeless tobacco: Never Used  . Alcohol use No   Current Outpatient Prescriptions  Medication Sig Dispense Refill  . Blood Glucose Monitoring Suppl (TRUE METRIX METER) W/DEVICE KIT 1 each by Does not apply route 4 (four) times daily -  before meals and at bedtime. 1 kit ea  . cholestyramine (QUESTRAN) 4 g packet Take 1 packet (4 g total) by mouth 3 (three) times daily with meals. 90 each 12  . diltiazem (CARDIZEM CD) 180 MG 24 hr capsule Take 1 capsule (180 mg total) by mouth daily. 30 capsule 5  . famotidine (PEPCID) 20 MG tablet Take 1 tablet (20 mg  total) by mouth 2 (two) times daily. 30 tablet 0  . furosemide (LASIX) 40 MG tablet Take 1 tablet (40 mg total) by mouth daily. 30 tablet 3  . gabapentin (NEURONTIN) 300 MG capsule TAKE 1 CAPSULE (300 MG TOTAL) BY MOUTH AT BEDTIME. 30 capsule 3  . glucose blood (TRUE METRIX BLOOD GLUCOSE TEST) test strip 1 each by Other route 3 (three) times daily. 100 each 11  . Insulin Glargine (LANTUS SOLOSTAR) 100 UNIT/ML Solostar Pen Inject 55 Units into the skin at bedtime. 90 mL 3  . insulin lispro (HUMALOG KWIKPEN) 100 UNIT/ML KiwkPen Inject 0.15 mLs (15 Units total) into the skin 3 (three) times daily.  45 mL 3  . Insulin Pen Needle 31G X 8 MM MISC Inject 1 each into the skin 4 (four) times daily. Check blood sugar TID and QHS 120 each 11  . lisinopril-hydrochlorothiazide (ZESTORETIC) 20-12.5 MG tablet Take 2 tablets by mouth daily. 60 tablet 3  . metoCLOPramide (REGLAN) 10 MG tablet TAKE 1 TABLET BY MOUTH 3 TIMES DAILY BEFORE MEALS AS NEEDED 90 tablet 5  . nortriptyline (PAMELOR) 25 MG capsule Take 25 mg before bedtime for one week, then 50 mg before bedtime 60 capsule 2  . Pancrelipase, Lip-Prot-Amyl, (ZENPEP) 20000-63000 units CPEP Take 2 capsules by mouth 3 (three) times daily before meals. Via PASS 540 capsule 3  . pantoprazole (PROTONIX) 40 MG tablet Take 1 tablet (40 mg total) by mouth 2 (two) times daily. 60 tablet 5  . potassium chloride SA (K-DUR,KLOR-CON) 10 MEQ tablet Take 1 tablet (10 mEq total) by mouth daily. 30 tablet 3  . promethazine (PHENERGAN) 25 MG tablet TAKE 1 TABLET BY MOUTH EVERY 8 HOURS AS NEEDED FOR NAUSEA OR VOMITING. 60 tablet 0  . sucralfate (CARAFATE) 1 g tablet Take 1 tablet (1 g total) by mouth 4 (four) times daily -  with meals and at bedtime. 120 tablet 5  . tadalafil 2.5 MG TABS Take 1 tablet (2.5 mg total) by mouth daily. 30 tablet 5  . TRUEPLUS LANCETS 28G MISC 1 each by Does not apply route 4 (four) times daily -  before meals and at bedtime. 100 each 12   No current facility-administered medications for this visit.    No Known Allergies   Review of Systems: All systems reviewed and negative except where noted in HPI.     Physical Exam: BP 132/86   Pulse 72   Ht 6' 4"  (1.93 m)   Wt (!) 343 lb 2 oz (155.6 kg)   BMI 41.77 kg/m  Constitutional:  Large framed black male in no acute distress. Psychiatric: Normal mood and affect. Behavior is normal. EENT: Pupils normal.  Conjunctivae are normal. No scleral icterus. Neck supple.  Cardiovascular: Normal rate, regular rhythm. No edema Pulmonary/chest: Effort normal and breath sounds normal. No  wheezing, rales or rhonchi. Abdominal: Soft, nondistended. Nontender. Bowel sounds active throughout. There are no masses palpable. No hepatomegaly. Lymphadenopathy: No cervical adenopathy noted. Neurological: Alert and oriented to person place and time. Skin: Skin is warm and dry. No rashes noted.   ASSESSMENT AND PLAN:  #90.  41 year old male with documented diabetic gastroparesis, admitted numerous times for exacerbations.  Over the last year he has managed the gastroparesis fairly well with diet and Reglan. While Reglan can cause diarrhea it seems unlikely in his case since he has been on it for years.   #2.  Worsening of chronic postprandial diarrhea.  Cholecystectomy in 2012 but diarrhea did not become  problematic until around 2015.  He is on Questran several times a day.  He takes Imodium.  PCP recently started him on pancreatic enzymes with only mild improvement. Diabetic enteropathy / SIBO ? IBS? Microscopic colitis?  -It appears that stool for fecal fat as well as fecal elastase were ordered but never done?? -I am reordering the studies as it makes sense that Lelend would have pancreatic insufficiency given description of diarrhea and his history diabetes.  -If stool studies come back negative could consider course of xifaxan, ? SIBO -Consider trial of Ondansetron  -If no improvement with above interventions will consider colonoscopy, possibly with random biopsies  Tye Savoy, NP  12/11/2016, 9:21 AM  Cc:  Arnoldo Morale, MD   Agree with Ms. Vanita Ingles assessment and plan.   The fecal elastase is low and fecal fat negative.  SIBO can cause low fecal elastase. Would Tx w/ Xifaxan or other Abx.  [name], MD, [facility]

## 2016-12-11 NOTE — Patient Instructions (Signed)
If you are age 40 or older, your body mass index should be between 23-30. Your Body mass index is 41.77 kg/m. If this is out of the aforementioned range listed, please consider follow up with your Primary Care Provider.  If you are age 67 or younger, your body mass index should be between 19-25. Your Body mass index is 41.77 kg/m. If this is out of the aformentioned range listed, please consider follow up with your Primary Care Provider.   Your physician has requested that you go to the basement for the following lab work before leaving today: Fecal Elastase Fecal Fat Qualitative  Will call you with results of labs.  Thank you for choosing me and [facility].   Tye Savoy, NP

## 2016-12-12 LAB — FECAL FAT, QUALITATIVE
Fat Qual Neutral, Stl: NORMAL
Fat Qual Total, Stl: NORMAL

## 2016-12-13 MED FILL — TORSEMIDE 20 MG TABLET: 20 | 30 days supply | Qty: 30 | Fill #1

## 2016-12-13 MED FILL — GABAPENTIN 300 MG CAPSULE: 300 | 30 days supply | Qty: 30 | Fill #1

## 2016-12-13 MED FILL — METOCLOPRAMIDE 10 MG TABLET: 10 | 30 days supply | Qty: 90 | Fill #2

## 2016-12-14 LAB — PANCREATIC ELASTASE, FECAL: PANCREATIC ELASTASE, FECAL: 63 ug Elast./g — AB (ref >200)

## 2016-12-14 MED FILL — TRUE METRIX TEST STRIP: 33 days supply | Qty: 100 | Fill #2

## 2016-12-21 MED FILL — $ZENPEP DR 20,000 UNITS CAP: 20000 | 33 days supply | Qty: 200 | Fill #0

## 2016-12-31 ENCOUNTER — Ambulatory Visit (INDEPENDENT_AMBULATORY_CARE_PROVIDER_SITE_OTHER): Payer: Medicare Other | Admitting: Orthopedic Surgery

## 2016-12-31 MED FILL — LISINOPRIL-HCTZ 20-12.5 MG: 20-12.5 | 30 days supply | Qty: 60 | Fill #2

## 2016-12-31 MED FILL — ?PANTOPRAZOLE SOD DR 40MG: 40 MG | 30 days supply | Qty: 60 | Fill #2

## 2017-01-01 ENCOUNTER — Telehealth: Payer: Self-pay

## 2017-01-01 ENCOUNTER — Other Ambulatory Visit: Payer: Self-pay

## 2017-01-01 MED ORDER — RIFAXIMIN 550 MG PO TABS: 550.0000 mg | ORAL_TABLET | Freq: Three times a day (TID) | ORAL | 0 refills | Status: AC

## 2017-01-01 MED FILL — *XIFAXAN 550MG TABLET: 550 | 14 days supply | Qty: 42 | Fill #0

## 2017-01-01 NOTE — Telephone Encounter (Signed)
Spoke to the patient. Explained the results and the plan of care. He expresses his frustration in dealing with his symptoms. He agrees to this plan of care. Rx sent to his [facility]. He is instructed to check with the pharmacy himself later today.

## 2017-01-01 NOTE — Telephone Encounter (Signed)
-----   Message from Willia Craze, NP sent at 01/01/2017  8:54 AM EST ----- Good Morning,  Will let patient that fecal elastase is low but he was not passing abnormal amounts of fat in stool indicating this is not likely pancreatic insufficiency causing the diarrhea.Since SIBO can cause a low fecal elastase it is worth trying a course of Xifaxan 550mg  TID x 14 days if we can get it for him.  Thanks [employer]

## 2017-01-08 MED FILL — $HUMALOG 100 UNITS/ML KWIKP: 100 | 33 days supply | Qty: 15 | Fill #2

## 2017-01-08 MED FILL — $LANTUS SOLOSTAR 100 UNITS/: 100 | 32 days supply | Qty: 18 | Fill #2

## 2017-01-16 ENCOUNTER — Ambulatory Visit: Payer: Medicare Other | Attending: Family Medicine | Admitting: Family Medicine

## 2017-01-16 ENCOUNTER — Encounter: Payer: Self-pay | Admitting: Family Medicine

## 2017-01-16 VITALS — BP 140/92 | HR 99 | Temp 98.0°F | Ht 76.0 in | Wt 344.0 lb

## 2017-01-16 DIAGNOSIS — I129 Hypertensive chronic kidney disease with stage 1 through stage 4 chronic kidney disease, or unspecified chronic kidney disease: Secondary | ICD-10-CM | POA: Diagnosis not present

## 2017-01-16 DIAGNOSIS — N183 Chronic kidney disease, stage 3 (moderate): Secondary | ICD-10-CM | POA: Insufficient documentation

## 2017-01-16 DIAGNOSIS — K3184 Gastroparesis: Secondary | ICD-10-CM | POA: Diagnosis not present

## 2017-01-16 DIAGNOSIS — R600 Localized edema: Secondary | ICD-10-CM | POA: Insufficient documentation

## 2017-01-16 DIAGNOSIS — Z23 Encounter for immunization: Secondary | ICD-10-CM | POA: Diagnosis not present

## 2017-01-16 DIAGNOSIS — E1021 Type 1 diabetes mellitus with diabetic nephropathy: Secondary | ICD-10-CM | POA: Insufficient documentation

## 2017-01-16 DIAGNOSIS — I10 Essential (primary) hypertension: Secondary | ICD-10-CM

## 2017-01-16 DIAGNOSIS — Z9049 Acquired absence of other specified parts of digestive tract: Secondary | ICD-10-CM | POA: Diagnosis not present

## 2017-01-16 DIAGNOSIS — F322 Major depressive disorder, single episode, severe without psychotic features: Secondary | ICD-10-CM | POA: Diagnosis not present

## 2017-01-16 DIAGNOSIS — K529 Noninfective gastroenteritis and colitis, unspecified: Secondary | ICD-10-CM

## 2017-01-16 DIAGNOSIS — Z9889 Other specified postprocedural states: Secondary | ICD-10-CM | POA: Insufficient documentation

## 2017-01-16 DIAGNOSIS — F329 Major depressive disorder, single episode, unspecified: Secondary | ICD-10-CM | POA: Insufficient documentation

## 2017-01-16 DIAGNOSIS — E1043 Type 1 diabetes mellitus with diabetic autonomic (poly)neuropathy: Secondary | ICD-10-CM | POA: Insufficient documentation

## 2017-01-16 DIAGNOSIS — E0843 Diabetes mellitus due to underlying condition with diabetic autonomic (poly)neuropathy: Secondary | ICD-10-CM | POA: Diagnosis not present

## 2017-01-16 DIAGNOSIS — Z794 Long term (current) use of insulin: Secondary | ICD-10-CM | POA: Diagnosis not present

## 2017-01-16 DIAGNOSIS — E1042 Type 1 diabetes mellitus with diabetic polyneuropathy: Secondary | ICD-10-CM | POA: Insufficient documentation

## 2017-01-16 DIAGNOSIS — E1022 Type 1 diabetes mellitus with diabetic chronic kidney disease: Secondary | ICD-10-CM | POA: Diagnosis not present

## 2017-01-16 DIAGNOSIS — Z79899 Other long term (current) drug therapy: Secondary | ICD-10-CM | POA: Diagnosis not present

## 2017-01-16 LAB — GLUCOSE, POCT (MANUAL RESULT ENTRY): POC Glucose: 333 mg/dl — AB (ref 70–99)

## 2017-01-16 LAB — POCT GLYCOSYLATED HEMOGLOBIN (HGB A1C): Hemoglobin A1C: 11.5

## 2017-01-16 MED ORDER — NORTRIPTYLINE HCL 50 MG PO CAPS: 50.0000 mg | ORAL_CAPSULE | Freq: Every day | ORAL | 5 refills | Status: DC

## 2017-01-16 MED ORDER — GABAPENTIN 300 MG PO CAPS: 300.0000 mg | ORAL_CAPSULE | Freq: Every day | ORAL | 5 refills | Status: DC

## 2017-01-16 MED ORDER — METOCLOPRAMIDE HCL 10 MG PO TABS
ORAL_TABLET | ORAL | 5 refills | Status: DC
Start: 2017-01-16 — End: 2017-08-20

## 2017-01-16 MED ORDER — INSULIN GLARGINE 100 UNIT/ML SOLOSTAR PEN: 55.0000 [IU] | PEN_INJECTOR | Freq: Every day | SUBCUTANEOUS | 3 refills | Status: DC

## 2017-01-16 MED ORDER — LISINOPRIL-HYDROCHLOROTHIAZIDE 20-12.5 MG PO TABS: 2.0000 | ORAL_TABLET | Freq: Every day | ORAL | 5 refills | Status: DC

## 2017-01-16 MED ORDER — ATORVASTATIN CALCIUM 20 MG PO TABS: 20.0000 mg | ORAL_TABLET | Freq: Every day | ORAL | 5 refills | Status: DC

## 2017-01-16 MED ORDER — PANTOPRAZOLE SODIUM 40 MG PO TBEC: 40.0000 mg | DELAYED_RELEASE_TABLET | Freq: Every day | ORAL | 5 refills | Status: DC

## 2017-01-16 MED ORDER — INSULIN LISPRO 100 UNIT/ML (KWIKPEN): 15.0000 [IU] | PEN_INJECTOR | Freq: Three times a day (TID) | SUBCUTANEOUS | 5 refills | Status: DC

## 2017-01-16 MED ORDER — POTASSIUM CHLORIDE CRYS ER 10 MEQ PO TBCR: 10.0000 meq | EXTENDED_RELEASE_TABLET | Freq: Every day | ORAL | 5 refills | Status: DC

## 2017-01-16 MED FILL — ATORVASTATIN 20 MG TABLET: 20 | 30 days supply | Qty: 30 | Fill #0

## 2017-01-16 MED FILL — NORTRIPTYLINE HCL 50 MG CAP: 50 | 30 days supply | Qty: 30 | Fill #0

## 2017-01-16 MED FILL — POTASSIUM CL ER 10 MEQ CAP: 10 | 30 days supply | Qty: 30 | Fill #0

## 2017-01-16 MED FILL — GABAPENTIN 300 MG CAPSULE: 300 | 30 days supply | Qty: 30 | Fill #0

## 2017-01-16 MED FILL — METOCLOPRAMIDE 10 MG TABLET: 10 | 30 days supply | Qty: 90 | Fill #0

## 2017-01-16 MED FILL — SUCRALFATE 1 GM TABLET: 1 | 30 days supply | Qty: 120 | Fill #4

## 2017-01-16 NOTE — Patient Instructions (Signed)
Edema Edema is when you have too much fluid in your body or under your skin. Edema may make your legs, feet, and ankles swell up. Swelling is also common in looser tissues, like around your eyes. This is a common condition. It gets more common as you get older. There are many possible causes of edema. Eating too much salt (sodium) and being on your feet or sitting for a long time can cause edema in your legs, feet, and ankles. Hot weather may make edema worse. Edema is usually painless. Your skin may look swollen or shiny. Follow these instructions at home:  Keep the swollen body part raised (elevated) above the level of your heart when you are sitting or lying down.  Do not sit still or stand for a long time.  Do not wear tight clothes. Do not wear garters on your upper legs.  Exercise your legs. This can help the swelling go down.  Wear elastic bandages or support stockings as told by your doctor.  Eat a low-salt (low-sodium) diet to reduce fluid as told by your doctor.  Depending on the cause of your swelling, you may need to limit how much fluid you drink (fluid restriction).  Take over-the-counter and prescription medicines only as told by your doctor. Contact a doctor if:  Treatment is not working.  You have heart, liver, or kidney disease and have symptoms of edema.  You have sudden and unexplained weight gain. Get help right away if:  You have shortness of breath or chest pain.  You cannot breathe when you lie down.  You have pain, redness, or warmth in the swollen areas.  You have heart, liver, or kidney disease and get edema all of a sudden.  You have a fever and your symptoms get worse all of a sudden. Summary  Edema is when you have too much fluid in your body or under your skin.  Edema may make your legs, feet, and ankles swell up. Swelling is also common in looser tissues, like around your eyes.  Raise (elevate) the swollen body part above the level of your  heart when you are sitting or lying down.  Follow your doctor's instructions about diet and how much fluid you can drink (fluid restriction). This information is not intended to replace advice given to you by your health care provider. Make sure you discuss any questions you have with your health care provider. Document Released: 07/18/2007 Document Revised: 02/17/2016 Document Reviewed: 02/17/2016 Elsevier Interactive Patient Education  2017 Elsevier Inc.  

## 2017-01-16 NOTE — Progress Notes (Signed)
Subjective:  Patient ID: Duane [name], male    DOB: 06-04-1975  Age: 41 y.o. MRN: [id number]  CC: Diabetes and Medication Refill   HPI Duane [name]  is a 41 year old male with history of type 1 diabetes mellitus (A1c 11.5 which has trended up from 10.7), diabetic neuropathy, diabetic gastroparesis hypertension, stage III chronic kidney disease who presents today for a follow-up visit.  He was referred to [facility] due to chronic diarrhea and was placed on Xifaxan with improvement in his symptoms but he does still have some diarrhea but this is infrequent.  He has run out of his Xifaxan and is wondering if he will need to remain on it long-term. He has been compliant with his Zenpep.  With regards to his diabetes he has been taking between 40 and 45 units of Lantus rather than 55 units prescribed; he has not been compliant with a diabetic diet and has not been exercising.  He promises to do better. His neuropathy is controlled on gabapentin and he has no visual concerns.  He has been compliant with his antihypertensive and denies adverse effects. With regards to his chronic pedal edema he takes diuretics as needed but notices that after being on his feet for a long time his pedal edema worsens; the left is greater than the right.  Past Medical History:  Diagnosis Date  . Depression   . Diabetes mellitus without complication [facility]) age 6   insulin requiring from the start  . Diabetic neuropathy ([place]) Dx 20110  . Gastritis   . Gastroparesis 2012   100% gastric retention on 08/2013 GES  . GERD (gastroesophageal reflux disease) Dx 2012  . Hypertension Dx 2012  . Obesity     Past Surgical History:  Procedure Laterality Date  . CHOLECYSTECTOMY  2013  . ESOPHAGOGASTRODUODENOSCOPY N/A 06/11/2013   Procedure: ESOPHAGOGASTRODUODENOSCOPY (EGD);  Surgeon: [name], MD;  Location: [facility];  Service: Endoscopy;  Laterality: N/A;  . FLEXIBLE SIGMOIDOSCOPY N/A 06/11/2013   Procedure: FLEXIBLE SIGMOIDOSCOPY;  Surgeon: [name], MD;  Location: [facility];  Service: Endoscopy;  Laterality: N/A;  . KNEE SURGERY Left     No Known Allergies   Outpatient Medications Prior to Visit  Medication Sig Dispense Refill  . Blood Glucose Monitoring Suppl (TRUE METRIX METER) W/DEVICE KIT 1 each by Does not apply route 4 (four) times daily -  before meals and at bedtime. 1 kit ea  . cholestyramine (QUESTRAN) 4 g packet Take 1 packet (4 g total) by mouth 3 (three) times daily with meals. 90 each 12  . diltiazem (CARDIZEM CD) 180 MG 24 hr capsule Take 1 capsule (180 mg total) by mouth daily. 30 capsule 5  . furosemide (LASIX) 40 MG tablet Take 1 tablet (40 mg total) by mouth daily. 30 tablet 3  . glucose blood (TRUE METRIX BLOOD GLUCOSE TEST) test strip 1 each by Other route 3 (three) times daily. 100 each 11  . Insulin Pen Needle 31G X 8 MM MISC Inject 1 each into the skin 4 (four) times daily. Check blood sugar TID and QHS 120 each 11  . Pancrelipase, Lip-Prot-Amyl, (ZENPEP) 20000-63000 units CPEP Take 2 capsules by mouth 3 (three) times daily before meals. Via PASS 540 capsule 3  . promethazine (PHENERGAN) 25 MG tablet TAKE 1 TABLET BY MOUTH EVERY 8 HOURS AS NEEDED FOR NAUSEA OR VOMITING. 60 tablet 0  . sucralfate (CARAFATE) 1 g tablet Take 1 tablet (1 g total) by mouth 4 (  four) times daily -  with meals and at bedtime. 120 tablet 5  . tadalafil 2.5 MG TABS Take 1 tablet (2.5 mg total) by mouth daily. 30 tablet 5  . TRUEPLUS LANCETS 28G MISC 1 each by Does not apply route 4 (four) times daily -  before meals and at bedtime. 100 each 12  . gabapentin (NEURONTIN) 300 MG capsule TAKE 1 CAPSULE (300 MG TOTAL) BY MOUTH AT BEDTIME. 30 capsule 3  . Insulin Glargine (LANTUS SOLOSTAR) 100 UNIT/ML Solostar Pen Inject 55 Units into the skin at bedtime. 90 mL 3  . insulin lispro (HUMALOG KWIKPEN) 100 UNIT/ML KiwkPen Inject 0.15 mLs (15 Units total) into the skin 3 (three) times  daily. 45 mL 3  . lisinopril-hydrochlorothiazide (ZESTORETIC) 20-12.5 MG tablet Take 2 tablets by mouth daily. 60 tablet 3  . metoCLOPramide (REGLAN) 10 MG tablet TAKE 1 TABLET BY MOUTH 3 TIMES DAILY BEFORE MEALS AS NEEDED 90 tablet 5  . nortriptyline (PAMELOR) 25 MG capsule Take 25 mg before bedtime for one week, then 50 mg before bedtime 60 capsule 2  . pantoprazole (PROTONIX) 40 MG tablet Take 1 tablet (40 mg total) by mouth 2 (two) times daily. 60 tablet 5  . potassium chloride SA (K-DUR,KLOR-CON) 10 MEQ tablet Take 1 tablet (10 mEq total) by mouth daily. 30 tablet 3  . famotidine (PEPCID) 20 MG tablet Take 1 tablet (20 mg total) by mouth 2 (two) times daily. (Patient not taking: Reported on 01/16/2017) 30 tablet 0   No facility-administered medications prior to visit.     ROS Review of Systems  Constitutional: Negative for activity change and appetite change.  HENT: Negative for sinus pressure and sore throat.   Eyes: Negative for visual disturbance.  Respiratory: Negative for cough, chest tightness and shortness of breath.   Cardiovascular: Positive for leg swelling. Negative for chest pain.  Gastrointestinal: Positive for diarrhea. Negative for abdominal distention, abdominal pain and constipation.  Endocrine: Negative.   Genitourinary: Negative for dysuria.  Musculoskeletal: Negative for joint swelling and myalgias.  Skin: Negative for rash.  Allergic/Immunologic: Negative.   Neurological: Negative for weakness, light-headedness and numbness.  Psychiatric/Behavioral: Negative for dysphoric mood and suicidal ideas.    Objective:  BP (!) 140/92   Pulse 99   Temp 98 F (36.7 C) (Oral)   Ht 6' 4"  (1.93 m)   Wt (!) 344 lb (156 kg)   SpO2 98%   BMI 41.87 kg/m   BP/Weight 01/16/2017 80/32/1224 09/13/5001  Systolic BP 704 888 916  Diastolic BP 92 86 65  Wt. (Lbs) 344 343.13 340.8  BMI 41.87 41.77 42.6      Physical Exam  Constitutional: He is oriented to person, place,  and time. He appears well-developed and well-nourished.  Cardiovascular: Normal rate, normal heart sounds and intact distal pulses.  No murmur heard. Pulmonary/Chest: Effort normal and breath sounds normal. He has no wheezes. He has no rales. He exhibits no tenderness.  Abdominal: Soft. Bowel sounds are normal. He exhibits no distension and no mass. There is no tenderness.  Musculoskeletal: Normal range of motion. He exhibits edema (2+ non pitting pedal edema L>R).  Neurological: He is alert and oriented to person, place, and time.  Skin: Skin is warm and dry.  Psychiatric: He has a normal mood and affect.     CMP Latest Ref Rng & Units 08/20/2016 06/15/2016 03/30/2016  Glucose 65 - 99 mg/dL 208(H) 269(H) 205(H)  BUN 6 - 20 mg/dL 23(H) 20 14  Creatinine  0.76 - 1.27 mg/dL 1.79(H) 1.72(H) 1.45(H)  Sodium 134 - 144 mmol/L 139 139 139  Potassium 3.5 - 5.2 mmol/L 4.9 4.7 4.4  Chloride 96 - 106 mmol/L 100 98 105  CO2 20 - 29 mmol/L 20 24 25   Calcium 8.7 - 10.2 mg/dL 9.9 9.9 8.8  Total Protein 6.1 - 8.1 g/dL - - 6.7  Total Bilirubin 0.2 - 1.2 mg/dL - - 0.3  Alkaline Phos 40 - 115 U/L - - 95  AST 10 - 40 U/L - - 25  ALT 9 - 46 U/L - - 38      Lab Results  Component Value Date   HGBA1C 11.5 01/16/2017    Assessment & Plan:   1. Type 1 diabetes mellitus with diabetic polyneuropathy (HCC) Controlled with A1c of 11.5 He has been advised to take 55 units of Lantus rather than 45 which he has been taking; also instructed on up titrating dose of Lantus until blood sugars are at goal Diabetic diet, lifestyle modification including weight loss Keep blood sugar logs with fasting goals of 80-120 mg/dl, random of less than 180 and in the event of sugars less than 60 mg/dl or greater than 400 mg/dl please notify the clinic ASAP. It is recommended that you undergo annual eye exams and annual foot exams. Pneumovax is recommended every 5 years before the age of 77 and once for a lifetime at or after  the age of 66. Commenced on atorvastatin for primary prevention of cardiovascular disease. - POCT glucose (manual entry) - POCT glycosylated hemoglobin (Hb A1C) - Insulin Glargine (LANTUS SOLOSTAR) 100 UNIT/ML Solostar Pen; Inject 55 Units into the skin at bedtime.  Dispense: 90 mL; Refill: 3 - CMP14+EGFR; Future - insulin lispro (HUMALOG KWIKPEN) 100 UNIT/ML KiwkPen; Inject 0.15 mLs (15 Units total) into the skin 3 (three) times daily.  Dispense: 45 mL; Refill: 5 - atorvastatin (LIPITOR) 20 MG tablet; Take 1 tablet (20 mg total) by mouth daily.  Dispense: 30 tablet; Refill: 5 - Lipid panel; Future - Microalbumin/Creatinine Ratio, Urine; Future  2. Essential hypertension Slightly above goal of less than 130/80 Low sodium, DASH diet - lisinopril-hydrochlorothiazide (ZESTORETIC) 20-12.5 MG tablet; Take 2 tablets by mouth daily.  Dispense: 60 tablet; Refill: 5  3. Pedal edema Uses Lasix as needed -exercise caution due to CKD Continue compression stockings Advised that weight loss will help with Low-sodium diet, elevate feet - potassium chloride (K-DUR,KLOR-CON) 10 MEQ tablet; Take 1 tablet (10 mEq total) by mouth daily.  Dispense: 30 tablet; Refill: 5  4. Diabetic autonomic neuropathy associated with diabetes mellitus due to underlying condition (HCC) Controlled - gabapentin (NEURONTIN) 300 MG capsule; Take 1 capsule (300 mg total) by mouth at bedtime.  Dispense: 30 capsule; Refill: 5  5. Diabetic gastroparesis associated with type 1 diabetes mellitus (HCC) Stable - metoCLOPramide (REGLAN) 10 MG tablet; TAKE 1 TABLET BY MOUTH 3 TIMES DAILY BEFORE MEALS AS NEEDED  Dispense: 90 tablet; Refill: 5 - pantoprazole (PROTONIX) 40 MG tablet; Take 1 tablet (40 mg total) by mouth daily.  Dispense: 30 tablet; Refill: 5  6. Chronic diarrhea Improved since commencing Xifaxan Patient to reach out to [place] for follow-up appointment and refill of Xifaxan - nortriptyline (PAMELOR) 50 MG capsule;  Take 1 capsule (50 mg total) by mouth at bedtime.  Dispense: 30 capsule; Refill: 5  7. MDD (major depressive disorder), single episode, severe , no psychosis (HCC) Controlled - nortriptyline (PAMELOR) 50 MG capsule; Take 1 capsule (50 mg total) by  mouth at bedtime.  Dispense: 30 capsule; Refill: 5  8. Need for influenza vaccination - Flu Vaccine QUAD 36+ mos IM  9. CKD Nation of hypertensive and diabetic nephropathy Avoid nephrotoxins Repeat renal function. Meds ordered this encounter  Medications  . gabapentin (NEURONTIN) 300 MG capsule    Sig: Take 1 capsule (300 mg total) by mouth at bedtime.    Dispense:  30 capsule    Refill:  5  . Insulin Glargine (LANTUS SOLOSTAR) 100 UNIT/ML Solostar Pen    Sig: Inject 55 Units into the skin at bedtime.    Dispense:  90 mL    Refill:  3  . lisinopril-hydrochlorothiazide (ZESTORETIC) 20-12.5 MG tablet    Sig: Take 2 tablets by mouth daily.    Dispense:  60 tablet    Refill:  5  . metoCLOPramide (REGLAN) 10 MG tablet    Sig: TAKE 1 TABLET BY MOUTH 3 TIMES DAILY BEFORE MEALS AS NEEDED    Dispense:  90 tablet    Refill:  5  . DISCONTD: nortriptyline (PAMELOR) 50 MG capsule    Sig: Take 1 capsule (50 mg total) by mouth at bedtime. Take 25 mg before bedtime for one week, then 50 mg before bedtime    Dispense:  30 capsule    Refill:  5  . pantoprazole (PROTONIX) 40 MG tablet    Sig: Take 1 tablet (40 mg total) by mouth daily.    Dispense:  30 tablet    Refill:  5  . potassium chloride (K-DUR,KLOR-CON) 10 MEQ tablet    Sig: Take 1 tablet (10 mEq total) by mouth daily.    Dispense:  30 tablet    Refill:  5  . insulin lispro (HUMALOG KWIKPEN) 100 UNIT/ML KiwkPen    Sig: Inject 0.15 mLs (15 Units total) into the skin 3 (three) times daily.    Dispense:  45 mL    Refill:  5  . atorvastatin (LIPITOR) 20 MG tablet    Sig: Take 1 tablet (20 mg total) by mouth daily.    Dispense:  30 tablet    Refill:  5  . nortriptyline (PAMELOR) 50 MG  capsule    Sig: Take 1 capsule (50 mg total) by mouth at bedtime.    Dispense:  30 capsule    Refill:  5    Follow-up: Return in about 3 months (around 04/16/2017) for follow up of chronic medical conditions.   Arnoldo Morale MD

## 2017-01-17 ENCOUNTER — Encounter: Payer: Self-pay | Admitting: Family Medicine

## 2017-01-18 ENCOUNTER — Other Ambulatory Visit: Payer: Self-pay

## 2017-01-22 DIAGNOSIS — N5201 Erectile dysfunction due to arterial insufficiency: Secondary | ICD-10-CM | POA: Diagnosis not present

## 2017-01-29 ENCOUNTER — Telehealth: Payer: Self-pay | Admitting: Nurse Practitioner

## 2017-01-29 ENCOUNTER — Other Ambulatory Visit: Payer: Self-pay

## 2017-01-29 DIAGNOSIS — N5201 Erectile dysfunction due to arterial insufficiency: Secondary | ICD-10-CM | POA: Diagnosis not present

## 2017-01-29 DIAGNOSIS — R310 Gross hematuria: Secondary | ICD-10-CM | POA: Diagnosis not present

## 2017-01-30 NOTE — Telephone Encounter (Signed)
Patient did pick up Xifaxan 01/01/17. Reports his bowel movements became normal and stayed that way for a few days after completing Xifaxan. Symptoms has gradually returned. He now has diarrhea again. He asks if he can repeat the Xifaxan. Please advise. Afebrile. No bloody diarrhea.

## 2017-02-01 DIAGNOSIS — R310 Gross hematuria: Secondary | ICD-10-CM | POA: Diagnosis not present

## 2017-02-01 DIAGNOSIS — R319 Hematuria, unspecified: Secondary | ICD-10-CM | POA: Diagnosis not present

## 2017-02-04 NOTE — Telephone Encounter (Signed)
Effects of xifaxan should have lasted longer.  Has the diarrhea come back just as bad as before?

## 2017-02-06 NOTE — Telephone Encounter (Signed)
Patient returning phone call regarding this states he is still having problems with diarrhea. Best call back # [phone]

## 2017-02-06 NOTE — Telephone Encounter (Signed)
Spoke to patient today, he states that the diarrhea is as bad as it was prior to trying the Xifaxan. He states that it started back up one week after stopping the Xifaxan. Please advise. Patient understands that Nevin Bloodgood is not here this week, routed to MD.

## 2017-02-07 ENCOUNTER — Other Ambulatory Visit: Payer: Self-pay

## 2017-02-07 MED ORDER — RIFAXIMIN 550 MG PO TABS: 550.0000 mg | ORAL_TABLET | Freq: Three times a day (TID) | ORAL | 0 refills | Status: DC

## 2017-02-07 MED FILL — $ZENPEP DR 20,000 UNITS CAP: 20000 | 33 days supply | Qty: 200 | Fill #1

## 2017-02-07 MED FILL — !XIFAXAN 550 MG TABLET: 550 | 6 days supply | Qty: 18 | Fill #0

## 2017-02-07 NOTE — Telephone Encounter (Signed)
Patient scheduled to see Dr. Carlean Purl on 03/11/17, Rx for Xifaxan sent to [facility] per patient request.

## 2017-02-07 NOTE — Telephone Encounter (Signed)
Repeat Xifaxan 550 mg tid x 14 days   Also schedule a f/u w/ paula or me for about 1 month-6 weeks from now

## 2017-02-07 NOTE — Telephone Encounter (Signed)
Glendell Docker, I think this is your patient, accidentally sent to me

## 2017-02-11 MED FILL — TRUE METRIX TEST STRIP: 33 days supply | Qty: 100 | Fill #3

## 2017-02-11 MED FILL — ?PANTOPRAZOLE SOD DR 40MG: 40 MG | 30 days supply | Qty: 60 | Fill #3

## 2017-02-11 MED FILL — LISINOPRIL-HCTZ 20-12.5 MG: 20-12.5 | 30 days supply | Qty: 60 | Fill #3

## 2017-02-11 MED FILL — NORTRIPTYLINE HCL 50 MG CAP: 50 | 30 days supply | Qty: 30 | Fill #1

## 2017-02-13 MED FILL — !XIFAXAN 550 MG TABLET: 550 | 8 days supply | Qty: 24 | Fill #1

## 2017-02-13 MED FILL — ?ATORVASTATIN 20 MG TABLET: 20 | 30 days supply | Qty: 30 | Fill #1

## 2017-02-13 MED FILL — POTASSIUM CL ER 10 MEQ CAP: 10 | 30 days supply | Qty: 30 | Fill #1

## 2017-02-14 ENCOUNTER — Other Ambulatory Visit: Payer: Self-pay

## 2017-02-14 MED ORDER — RIFAXIMIN 550 MG PO TABS: 550.0000 mg | ORAL_TABLET | Freq: Three times a day (TID) | ORAL | 3 refills | Status: DC

## 2017-02-14 MED FILL — $LANTUS SOLOSTAR 100 UNITS/: 100 | 32 days supply | Qty: 18 | Fill #3

## 2017-02-14 MED FILL — $HUMALOG 100 UNITS/ML KWIKP: 100 | 33 days supply | Qty: 15 | Fill #3

## 2017-02-18 DIAGNOSIS — R310 Gross hematuria: Secondary | ICD-10-CM | POA: Diagnosis not present

## 2017-02-18 DIAGNOSIS — N529 Male erectile dysfunction, unspecified: Secondary | ICD-10-CM | POA: Diagnosis not present

## 2017-02-22 MED FILL — METOCLOPRAMIDE 10 MG TABLET: 10 | 30 days supply | Qty: 90 | Fill #1

## 2017-02-22 MED FILL — GABAPENTIN 300 MG CAPSULE: 300 | 30 days supply | Qty: 30 | Fill #1

## 2017-03-04 ENCOUNTER — Ambulatory Visit (HOSPITAL_COMMUNITY): Payer: Self-pay | Admitting: Psychiatry

## 2017-03-11 ENCOUNTER — Encounter: Payer: Self-pay | Admitting: Internal Medicine

## 2017-03-11 ENCOUNTER — Ambulatory Visit (INDEPENDENT_AMBULATORY_CARE_PROVIDER_SITE_OTHER): Payer: Medicare Other | Admitting: Internal Medicine

## 2017-03-11 ENCOUNTER — Other Ambulatory Visit: Payer: Medicare Other

## 2017-03-11 VITALS — BP 126/82 | HR 76 | Ht 76.0 in | Wt 347.1 lb

## 2017-03-11 DIAGNOSIS — K529 Noninfective gastroenteritis and colitis, unspecified: Secondary | ICD-10-CM

## 2017-03-11 MED ORDER — RIFAXIMIN 550 MG PO TABS: 550.0000 mg | ORAL_TABLET | Freq: Three times a day (TID) | ORAL | 0 refills | Status: DC

## 2017-03-11 MED FILL — $XIFAXAN 550 MG TABLET: 550 | 14 days supply | Qty: 42 | Fill #0

## 2017-03-11 NOTE — Patient Instructions (Addendum)
We have sent the following medications to your pharmacy for you to pick up at your convenience: Xifaxan, faxed to his pharmacy   Your physician has requested that you go to the basement for the following lab work before leaving today: C-diff , please pick up the container today and do this test if your watery diarrhea comes back.    Please come see [name] in 2 months for follow up.    I appreciate the opportunity to care for you. Duane Rusk, MD, [facility]

## 2017-03-11 NOTE — Progress Notes (Signed)
Duane [name] 42 y.o. 02-11-1976 [id number]  Assessment & Plan:   Encounter Diagnosis  Name Primary?  . Chronic diarrhea Yes    Because of his diarrhea is not clear.  The fact that he response to Xifaxan suggests 2 possibilities to me mainly, that a small intestinal bacterial overgrowth and possibly C. difficile.  Turns out his C. difficile PCR test was not collected so we really do not know if his C. difficile testing was negative back in the summer.  If this were small intestinal bacterial overgrowth I would have expected a more durable response.  I do wonder if he is not experiencing some postinfectious IBS at this point.  Another possibility though less likely would be inflammatory bowel disease such as Crohn's disease which may respond to Xifaxan but I would be surprised if his bowel movements normalized that quickly in response to the Xifaxan.  Diabetic diarrhea is a mysterious disorder probably autonomic in origin and is very difficult to treat, I do not think that would respond to Xifaxan but will keep that in mind.  Our current plan is to prescribe Xifaxan to have on hand if he gets severe diarrhea again especially since she has a trip planned for the upcoming week.  If he gets watery diarrhea again I do want him to do a C. difficile test.  He understands.  At this point we will plan to see him back in 2 months for follow-up, he knows to call in the interim if he needs [name].  I have taken cholestyramine and pancreatic enzymes off his list for medications because he is not using these and they were ineffective.  Though I do have some question as to whether or not they might of mitigate the symptoms somewhat at some point, particularly the cholestyramine.  I appreciate the opportunity to care for this patient.  25 minutes time spent with patient > half in counseling coordination of care  CC: Duane Rakes, MD    Subjective:   Chief Complaint: Persistent diarrhea  HPI The  patient is a very nice 42 year old African-American man with type 1 diabetes and over a years worth of diarrhea.  At its worst it was watery urgent and 5-10 times a day including nocturnal stools.  He was evaluated by primary care with a negative stool culture, he was supposed to have a C. difficile toxin by PCR but it was not performed because of a collection issue.  It was thought that his chronic diarrhea was unlikely to be C. difficile.  He was treated with cholestyramine, Lomotil, loperamide, and pancreatic enzymes none of which helped.  He saw Tye Savoy in our office.  A fecal elastase was low but qualitative fecal fat negative.  Xifaxan was prescribed and he noted a prompt improvement in symptoms on 550 mg 3 times daily.  However about a week after completing that he said he was having frequent watery diarrhea again and called [name] so I represcribed a course of Xifaxan which again produced normal bowel movements.  He has been off that a week to 10 days and has had an occasional irregular loose stool, and is concerned that things may be coming back, particularly in light of the fact that he is traveling to [place] by car in about a week to see his son play his last senior high school basketball game.  Wt Readings from Last 3 Encounters:  03/11/17 (!) 347 lb 2 oz (157.5 kg)  01/16/17 (!) 344 lb (156 kg)  12/11/16 (!) 343 lb 2 oz (155.6 kg)    No Known Allergies Current Meds  Medication Sig  . atorvastatin (LIPITOR) 20 MG tablet Take 1 tablet (20 mg total) by mouth daily.  . Blood Glucose Monitoring Suppl (TRUE METRIX METER) W/DEVICE KIT 1 each by Does not apply route 4 (four) times daily -  before meals and at bedtime.  Marland Kitchen diltiazem (CARDIZEM CD) 180 MG 24 hr capsule Take 1 capsule (180 mg total) by mouth daily.  . famotidine (PEPCID) 20 MG tablet Take 1 tablet (20 mg total) by mouth 2 (two) times daily.  . furosemide (LASIX) 40 MG tablet Take 1 tablet (40 mg total) by mouth daily.  Marland Kitchen  gabapentin (NEURONTIN) 300 MG capsule Take 1 capsule (300 mg total) by mouth at bedtime.  Marland Kitchen glucose blood (TRUE METRIX BLOOD GLUCOSE TEST) test strip 1 each by Other route 3 (three) times daily.  . Insulin Glargine (LANTUS SOLOSTAR) 100 UNIT/ML Solostar Pen Inject 55 Units into the skin at bedtime.  . insulin lispro (HUMALOG KWIKPEN) 100 UNIT/ML KiwkPen Inject 0.15 mLs (15 Units total) into the skin 3 (three) times daily.  . Insulin Pen Needle 31G X 8 MM MISC Inject 1 each into the skin 4 (four) times daily. Check blood sugar TID and QHS  . lisinopril-hydrochlorothiazide (ZESTORETIC) 20-12.5 MG tablet Take 2 tablets by mouth daily.  . metoCLOPramide (REGLAN) 10 MG tablet TAKE 1 TABLET BY MOUTH 3 TIMES DAILY BEFORE MEALS AS NEEDED  . nortriptyline (PAMELOR) 50 MG capsule Take 1 capsule (50 mg total) by mouth at bedtime.  . pantoprazole (PROTONIX) 40 MG tablet Take 1 tablet (40 mg total) by mouth daily.  . potassium chloride (K-DUR,KLOR-CON) 10 MEQ tablet Take 1 tablet (10 mEq total) by mouth daily.  . promethazine (PHENERGAN) 25 MG tablet TAKE 1 TABLET BY MOUTH EVERY 8 HOURS AS NEEDED FOR NAUSEA OR VOMITING.  . rifaximin (XIFAXAN) 550 MG TABS tablet Take 1 tablet (550 mg total) by mouth 3 (three) times daily.  . sucralfate (CARAFATE) 1 g tablet Take 1 tablet (1 g total) by mouth 4 (four) times daily -  with meals and at bedtime.  . tadalafil 2.5 MG TABS Take 1 tablet (2.5 mg total) by mouth daily.  . TRUEPLUS LANCETS 28G MISC 1 each by Does not apply route 4 (four) times daily -  before meals and at bedtime.  . [DISCONTINUED] cholestyramine (QUESTRAN) 4 g packet Take 1 packet (4 g total) by mouth 3 (three) times daily with meals.  . [DISCONTINUED] Pancrelipase, Lip-Prot-Amyl, (ZENPEP) 20000-63000 units CPEP Take 2 capsules by mouth 3 (three) times daily before meals. Via PASS  . [DISCONTINUED] rifaximin (XIFAXAN) 550 MG TABS tablet Take 1 tablet (550 mg total) by mouth 3 (three) times daily.   Past  Medical History:  Diagnosis Date  . Depression   . Diabetes mellitus without complication [facility]) age 17   insulin requiring from the start  . Diabetic neuropathy ([place]) Dx 20110  . Gastritis   . Gastroparesis 2012   100% gastric retention on 08/2013 GES  . GERD (gastroesophageal reflux disease) Dx 2012  . Hypertension Dx 2012  . Obesity    Past Surgical History:  Procedure Laterality Date  . CHOLECYSTECTOMY  2013  . ESOPHAGOGASTRODUODENOSCOPY N/A 06/11/2013   Procedure: ESOPHAGOGASTRODUODENOSCOPY (EGD);  Surgeon: [name], MD;  Location: [facility];  Service: Endoscopy;  Laterality: N/A;  . FLEXIBLE SIGMOIDOSCOPY N/A 06/11/2013   Procedure: FLEXIBLE SIGMOIDOSCOPY;  Surgeon: [name],  MD;  Location: [facility];  Service: Endoscopy;  Laterality: N/A;  . KNEE SURGERY Left    Social History   Social History Narrative   He is single and a disabled security guard   1 son at least, I do not know if he has other children   No alcohol tobacco or drug use reported no smoking   family history includes Diabetes in his father, maternal grandfather, and maternal grandmother.   Review of Systems As per HPI  Objective:   Physical Exam _0  126/82   Pulse 76   Ht _1  (1.93 m)   Wt (!) 347 lb 2 oz (157.5 kg)   BMI 42.25 kg/m @  General:  NAD Eyes:   anicteric Lungs:  clear Heart::  S1S2 no rubs, murmurs or gallops Abdomen:  soft and nontender, BS+ there is a mild diastases recti present in the upper abdomen Ext:   no edema, cyanosis or clubbing    Data Reviewed:   As per HPI

## 2017-03-18 MED FILL — NORTRIPTYLINE HCL 50 MG CAP: 50 | 30 days supply | Qty: 30 | Fill #2

## 2017-03-18 MED FILL — ?ATORVASTATIN 20MG TABL: 20 | 30 days supply | Qty: 30 | Fill #2

## 2017-03-18 MED FILL — ?PANTOPRAZOLE SOD DR 40MG: 40 MG | 30 days supply | Qty: 60 | Fill #4

## 2017-03-18 MED FILL — LISINOPRIL-HCTZ 20-12.5 MG: 20-12.5 | 30 days supply | Qty: 60 | Fill #0

## 2017-03-18 MED FILL — METOCLOPRAMIDE 10 MG TABLET: 10 | 30 days supply | Qty: 90 | Fill #2

## 2017-03-18 MED FILL — POTASSIUM CL ER 10 MEQ CAP: 10 | 30 days supply | Qty: 30 | Fill #2

## 2017-04-04 MED FILL — $HUMALOG 100 UNITS/ML KWIKP: 100 | 33 days supply | Qty: 15 | Fill #4

## 2017-04-04 MED FILL — $LANTUS SOLOSTAR 100 UNITS/: 100 | 32 days supply | Qty: 18 | Fill #4

## 2017-04-04 MED FILL — GABAPENTIN 300 MG CAPSULE: 300 | 30 days supply | Qty: 30 | Fill #2

## 2017-04-16 ENCOUNTER — Ambulatory Visit: Payer: Self-pay | Admitting: Family Medicine

## 2017-04-17 MED FILL — POTASSIUM CL ER 10 MEQ CAP: 10 | 30 days supply | Qty: 30 | Fill #3

## 2017-04-17 MED FILL — ATORVASTATIN 20 MG TABLET: 20 | 30 days supply | Qty: 30 | Fill #3

## 2017-04-17 MED FILL — NORTRIPTYLINE HCL 50 MG CAP: 50 | 30 days supply | Qty: 30 | Fill #3

## 2017-04-22 ENCOUNTER — Other Ambulatory Visit: Payer: Self-pay

## 2017-04-22 DIAGNOSIS — E1043 Type 1 diabetes mellitus with diabetic autonomic (poly)neuropathy: Secondary | ICD-10-CM

## 2017-04-22 DIAGNOSIS — K3184 Gastroparesis: Principal | ICD-10-CM

## 2017-04-22 MED ORDER — SUCRALFATE 1 G PO TABS: 1.0000 g | ORAL_TABLET | Freq: Three times a day (TID) | ORAL | 2 refills | Status: DC

## 2017-04-22 MED FILL — TRUE METRIX TEST STRIP: 33 days supply | Qty: 100 | Fill #4

## 2017-04-23 MED FILL — SUCRALFATE 1 GM TABLET: 1 | 30 days supply | Qty: 120 | Fill #0

## 2017-05-07 MED FILL — LISINOPRIL-HCTZ 20-12.5 MG: 20-12.5 | 30 days supply | Qty: 60 | Fill #1

## 2017-05-07 MED FILL — METOCLOPRAMIDE 10 MG TABLET: 10 | 30 days supply | Qty: 90 | Fill #3

## 2017-05-07 MED FILL — GABAPENTIN 300 MG CAPSULE: 300 | 30 days supply | Qty: 30 | Fill #3

## 2017-05-10 ENCOUNTER — Encounter: Payer: Self-pay | Admitting: Internal Medicine

## 2017-05-10 ENCOUNTER — Ambulatory Visit (INDEPENDENT_AMBULATORY_CARE_PROVIDER_SITE_OTHER): Payer: Medicare Other | Admitting: Internal Medicine

## 2017-05-10 VITALS — BP 138/88 | HR 101 | Ht 76.0 in | Wt 343.0 lb

## 2017-05-10 DIAGNOSIS — K3184 Gastroparesis: Secondary | ICD-10-CM | POA: Diagnosis not present

## 2017-05-10 DIAGNOSIS — E1043 Type 1 diabetes mellitus with diabetic autonomic (poly)neuropathy: Secondary | ICD-10-CM

## 2017-05-10 DIAGNOSIS — E1065 Type 1 diabetes mellitus with hyperglycemia: Secondary | ICD-10-CM

## 2017-05-10 DIAGNOSIS — K529 Noninfective gastroenteritis and colitis, unspecified: Secondary | ICD-10-CM | POA: Diagnosis not present

## 2017-05-10 NOTE — Assessment & Plan Note (Signed)
Worse w/ elevated BS

## 2017-05-10 NOTE — Progress Notes (Signed)
Duane [name] 42 y.o. May 23, 1975 [id number]  Assessment & Plan:   Diabetic gastroparesis associated with type 1 diabetes mellitus (HCC) Worse w/ elevated BS   Chronic diarrhea Seems better with Xifaxan but pattern of response not typical for SIBO If recurs needs C diff PCR                . Uncontrolled type 1 diabetes mellitus with hyperglycemia [facility])     He will see me again in June.  We will get him an appointment with Dr. Dwyane Dee.  No major changes right now he needs to get his blood sugars under better control.  I still have some question of whether or not the diarrhea could be C. difficile coming and going though probably not it is possible and if he gets watery diarrhea again he is to have a C. difficile PCR test  I appreciate the opportunity to care for this patient. CC: Charlott Rakes, MD  Subjective:   Chief Complaint: Diarrhea and gastroparesis  HPI The patient is here for follow-up of his chronic diarrhea, and gastroparesis.  He has now on his fourth course of Xifaxan which seems to help his diarrhea but he does not get long lasting effects.  He took 2 courses in the early part of the year and he did well throughout February and then has had recurrent symptoms so is taking another 2 courses.  Stools are formed now though he is on the antibiotics.  He knows his blood sugars are too high, his last hemoglobin was over 11.  He sometime is having problems with regurgitation nausea and vomiting when he does not take his metoclopramide.  He has been able to stay out of the hospital for over a year which pleases him but he is concerned and thinks he needs to see an endocrinologist again and may need an insulin pump.  His insulin doses have been increased but he is still having hyperglycemia.  He says his diet is not the problem i.e. he is compliant perhaps eating less he tells me. No Known Allergies Current Meds  Medication Sig  . atorvastatin (LIPITOR) 20 MG tablet Take 1  tablet (20 mg total) by mouth daily.  . Blood Glucose Monitoring Suppl (TRUE METRIX METER) W/DEVICE KIT 1 each by Does not apply route 4 (four) times daily -  before meals and at bedtime.  Marland Kitchen diltiazem (CARDIZEM CD) 180 MG 24 hr capsule Take 1 capsule (180 mg total) by mouth daily.  . famotidine (PEPCID) 20 MG tablet Take 1 tablet (20 mg total) by mouth 2 (two) times daily.  . furosemide (LASIX) 40 MG tablet Take 1 tablet (40 mg total) by mouth daily.  Marland Kitchen gabapentin (NEURONTIN) 300 MG capsule Take 1 capsule (300 mg total) by mouth at bedtime.  Marland Kitchen glucose blood (TRUE METRIX BLOOD GLUCOSE TEST) test strip 1 each by Other route 3 (three) times daily.  . Insulin Glargine (LANTUS SOLOSTAR) 100 UNIT/ML Solostar Pen Inject 55 Units into the skin at bedtime.  . insulin lispro (HUMALOG KWIKPEN) 100 UNIT/ML KiwkPen Inject 0.15 mLs (15 Units total) into the skin 3 (three) times daily.  . Insulin Pen Needle 31G X 8 MM MISC Inject 1 each into the skin 4 (four) times daily. Check blood sugar TID and QHS  . lisinopril-hydrochlorothiazide (ZESTORETIC) 20-12.5 MG tablet Take 2 tablets by mouth daily.  . metoCLOPramide (REGLAN) 10 MG tablet TAKE 1 TABLET BY MOUTH 3 TIMES DAILY BEFORE MEALS AS NEEDED  . nortriptyline (  PAMELOR) 50 MG capsule Take 1 capsule (50 mg total) by mouth at bedtime.  . pantoprazole (PROTONIX) 40 MG tablet Take 1 tablet (40 mg total) by mouth daily.  . potassium chloride (K-DUR,KLOR-CON) 10 MEQ tablet Take 1 tablet (10 mEq total) by mouth daily.  . promethazine (PHENERGAN) 25 MG tablet TAKE 1 TABLET BY MOUTH EVERY 8 HOURS AS NEEDED FOR NAUSEA OR VOMITING.  . rifaximin (XIFAXAN) 550 MG TABS tablet Take 1 tablet (550 mg total) by mouth 3 (three) times daily.  . sucralfate (CARAFATE) 1 g tablet Take 1 tablet (1 g total) by mouth 4 (four) times daily -  with meals and at bedtime.  . tadalafil 2.5 MG TABS Take 1 tablet (2.5 mg total) by mouth daily.  . TRUEPLUS LANCETS 28G MISC 1 each by Does not  apply route 4 (four) times daily -  before meals and at bedtime.   Past Medical History:  Diagnosis Date  . Depression   . Diabetes mellitus without complication [facility]) age 45   insulin requiring from the start  . Diabetic neuropathy ([place]) Dx 201[address]  . Gastritis   . Gastroparesis 2012   100% gastric retention on 08/2013 [facility]  . GERD (gastroesophageal reflux disease) Dx 2012  . Hypertension Dx 2012  . Obesity    Past Surgical History:  Procedure Laterality Date  . CHOLECYSTECTOMY  2013  . ESOPHAGOGASTRODUODENOSCOPY N/A 06/11/2013   Procedure: ESOPHAGOGASTRODUODENOSCOPY (EGD);  Surgeon: [name], MD;  Location: [facility];  Service: Endoscopy;  Laterality: N/A;  . FLEXIBLE SIGMOIDOSCOPY N/A 06/11/2013   Procedure: FLEXIBLE SIGMOIDOSCOPY;  Surgeon: [name], MD;  Location: [facility] END[facility]COPY;  Service: Endoscopy;  Laterality: N/A;  . KNEE SURGERY Left    Social History   Social History Narrative   He is single and a disabled security guard   1 son at least, I do not know if he has other children   No alcohol tobacco or drug use reported no smoking   family history includes Diabetes in his father, maternal grandfather, and maternal grandmother.   Review of Systems As above  Objective:   Physical Exam BP 138/88   Pulse (!) 101   Ht _0  (1.93 m)   Wt (!) 343 lb (155.6 kg)   BMI 41.75 kg/m  No acute distress Eyes anicteric Lungs are clear Heart S1-S2 no rubs murmurs or gallops Abdomen is obese soft nontender

## 2017-05-10 NOTE — Assessment & Plan Note (Addendum)
Seems better with Xifaxan but pattern of response not typical for SIBO If recurs needs C diff PCR

## 2017-05-10 NOTE — Patient Instructions (Signed)
Please follow up with Dr Dwyane Dee about your diabetes.   If you have watery diarrhea let us know as you may need a C-Diff test.   Follow up with [name] in June.   I appreciate the opportunity to care for you. Silvano Rusk, MD, [facility]

## 2017-05-13 DIAGNOSIS — E113313 Type 2 diabetes mellitus with moderate nonproliferative diabetic retinopathy with macular edema, bilateral: Secondary | ICD-10-CM | POA: Diagnosis not present

## 2017-05-16 MED FILL — $HUMALOG 100 UNITS/ML KWIKP: 100 | 33 days supply | Qty: 15 | Fill #5

## 2017-05-16 MED FILL — $LANTUS SOLOSTAR 100 UNITS/: 100 | 32 days supply | Qty: 18 | Fill #5

## 2017-05-17 ENCOUNTER — Ambulatory Visit: Payer: Medicare Other | Attending: Family Medicine | Admitting: Family Medicine

## 2017-05-17 ENCOUNTER — Encounter: Payer: Self-pay | Admitting: Family Medicine

## 2017-05-17 VITALS — BP 147/85 | HR 107 | Temp 97.8°F | Ht 76.0 in | Wt 346.0 lb

## 2017-05-17 DIAGNOSIS — Z9049 Acquired absence of other specified parts of digestive tract: Secondary | ICD-10-CM | POA: Diagnosis not present

## 2017-05-17 DIAGNOSIS — E0843 Diabetes mellitus due to underlying condition with diabetic autonomic (poly)neuropathy: Secondary | ICD-10-CM | POA: Diagnosis not present

## 2017-05-17 DIAGNOSIS — E1022 Type 1 diabetes mellitus with diabetic chronic kidney disease: Secondary | ICD-10-CM | POA: Insufficient documentation

## 2017-05-17 DIAGNOSIS — N183 Chronic kidney disease, stage 3 (moderate): Secondary | ICD-10-CM | POA: Insufficient documentation

## 2017-05-17 DIAGNOSIS — E1042 Type 1 diabetes mellitus with diabetic polyneuropathy: Secondary | ICD-10-CM | POA: Diagnosis not present

## 2017-05-17 DIAGNOSIS — E1043 Type 1 diabetes mellitus with diabetic autonomic (poly)neuropathy: Secondary | ICD-10-CM | POA: Insufficient documentation

## 2017-05-17 DIAGNOSIS — E10319 Type 1 diabetes mellitus with unspecified diabetic retinopathy without macular edema: Secondary | ICD-10-CM | POA: Diagnosis not present

## 2017-05-17 DIAGNOSIS — I129 Hypertensive chronic kidney disease with stage 1 through stage 4 chronic kidney disease, or unspecified chronic kidney disease: Secondary | ICD-10-CM | POA: Insufficient documentation

## 2017-05-17 DIAGNOSIS — Z79899 Other long term (current) drug therapy: Secondary | ICD-10-CM | POA: Diagnosis not present

## 2017-05-17 DIAGNOSIS — E559 Vitamin D deficiency, unspecified: Secondary | ICD-10-CM | POA: Diagnosis not present

## 2017-05-17 DIAGNOSIS — I10 Essential (primary) hypertension: Secondary | ICD-10-CM

## 2017-05-17 DIAGNOSIS — H538 Other visual disturbances: Secondary | ICD-10-CM | POA: Insufficient documentation

## 2017-05-17 DIAGNOSIS — M62838 Other muscle spasm: Secondary | ICD-10-CM | POA: Insufficient documentation

## 2017-05-17 DIAGNOSIS — G4700 Insomnia, unspecified: Secondary | ICD-10-CM | POA: Diagnosis not present

## 2017-05-17 DIAGNOSIS — L97511 Non-pressure chronic ulcer of other part of right foot limited to breakdown of skin: Secondary | ICD-10-CM | POA: Diagnosis not present

## 2017-05-17 DIAGNOSIS — K3184 Gastroparesis: Secondary | ICD-10-CM | POA: Insufficient documentation

## 2017-05-17 DIAGNOSIS — R61 Generalized hyperhidrosis: Secondary | ICD-10-CM | POA: Insufficient documentation

## 2017-05-17 DIAGNOSIS — E669 Obesity, unspecified: Secondary | ICD-10-CM | POA: Diagnosis not present

## 2017-05-17 DIAGNOSIS — Z794 Long term (current) use of insulin: Secondary | ICD-10-CM | POA: Diagnosis not present

## 2017-05-17 DIAGNOSIS — Z9889 Other specified postprocedural states: Secondary | ICD-10-CM | POA: Diagnosis not present

## 2017-05-17 DIAGNOSIS — G4709 Other insomnia: Secondary | ICD-10-CM

## 2017-05-17 DIAGNOSIS — K219 Gastro-esophageal reflux disease without esophagitis: Secondary | ICD-10-CM | POA: Diagnosis not present

## 2017-05-17 DIAGNOSIS — F329 Major depressive disorder, single episode, unspecified: Secondary | ICD-10-CM | POA: Insufficient documentation

## 2017-05-17 DIAGNOSIS — R600 Localized edema: Secondary | ICD-10-CM | POA: Diagnosis not present

## 2017-05-17 LAB — GLUCOSE, POCT (MANUAL RESULT ENTRY): POC GLUCOSE: 362 mg/dL — AB (ref 70–99)

## 2017-05-17 LAB — POCT GLYCOSYLATED HEMOGLOBIN (HGB A1C): Hemoglobin A1C: 14.5

## 2017-05-17 MED ORDER — ALUMINUM CHLORIDE 20 % EX SOLN: Freq: Every day | CUTANEOUS | 1 refills | Status: DC

## 2017-05-17 MED ORDER — POTASSIUM CHLORIDE CRYS ER 10 MEQ PO TBCR: 10.0000 meq | EXTENDED_RELEASE_TABLET | Freq: Every day | ORAL | 5 refills | Status: DC

## 2017-05-17 MED ORDER — DILTIAZEM HCL ER COATED BEADS 180 MG PO CP24: 180.0000 mg | ORAL_CAPSULE | Freq: Every day | ORAL | 5 refills | Status: DC

## 2017-05-17 MED ORDER — FUROSEMIDE 40 MG PO TABS: 40.0000 mg | ORAL_TABLET | Freq: Every day | ORAL | 3 refills | Status: DC

## 2017-05-17 MED ORDER — INSULIN GLARGINE 100 UNIT/ML SOLOSTAR PEN: 60.0000 [IU] | PEN_INJECTOR | Freq: Every day | SUBCUTANEOUS | 3 refills | Status: DC

## 2017-05-17 MED ORDER — PANTOPRAZOLE SODIUM 40 MG PO TBEC: 40.0000 mg | DELAYED_RELEASE_TABLET | Freq: Every day | ORAL | 5 refills | Status: DC

## 2017-05-17 MED ORDER — CEPHALEXIN 500 MG PO CAPS: 500.0000 mg | ORAL_CAPSULE | Freq: Four times a day (QID) | ORAL | 0 refills | Status: DC

## 2017-05-17 MED FILL — DRYSOL SOLUTION: 20 | 20 days supply | Qty: 60 | Fill #0

## 2017-05-17 MED FILL — POTASSIUM CL ER 10 MEQ CAP: 10 | 30 days supply | Qty: 30 | Fill #4

## 2017-05-17 MED FILL — CARTIA XT 180 MG CAPSULE SA: 180 | 30 days supply | Qty: 30 | Fill #0

## 2017-05-17 MED FILL — CEPHALEXIN 500 MG CAPSULE: 500 | 5 days supply | Qty: 20 | Fill #0

## 2017-05-17 MED FILL — PANTOPRAZOLE SOD DR 40 MG T: 40 | 30 days supply | Qty: 30 | Fill #0

## 2017-05-17 NOTE — Progress Notes (Signed)
Patient sugar levels have been running high. Swelling in lower legs and feet.

## 2017-05-17 NOTE — Patient Instructions (Signed)
Diabetes Mellitus and Nutrition When you have diabetes (diabetes mellitus), it is very important to have healthy eating habits because your blood sugar (glucose) levels are greatly affected by what you eat and drink. Eating healthy foods in the appropriate amounts, at about the same times every day, can help you:  Control your blood glucose.  Lower your risk of heart disease.  Improve your blood pressure.  Reach or maintain a healthy weight.  Every person with diabetes is different, and each person has different needs for a meal plan. Your health care provider may recommend that you work with a diet and nutrition specialist (dietitian) to make a meal plan that is best for you. Your meal plan may vary depending on factors such as:  The calories you need.  The medicines you take.  Your weight.  Your blood glucose, blood pressure, and cholesterol levels.  Your activity level.  Other health conditions you have, such as heart or kidney disease.  How do carbohydrates affect me? Carbohydrates affect your blood glucose level more than any other type of food. Eating carbohydrates naturally increases the amount of glucose in your blood. Carbohydrate counting is a method for keeping track of how many carbohydrates you eat. Counting carbohydrates is important to keep your blood glucose at a healthy level, especially if you use insulin or take certain oral diabetes medicines. It is important to know how many carbohydrates you can safely have in each meal. This is different for every person. Your dietitian can help you calculate how many carbohydrates you should have at each meal and for snack. Foods that contain carbohydrates include:  Bread, cereal, rice, pasta, and crackers.  Potatoes and corn.  Peas, beans, and lentils.  Milk and yogurt.  Fruit and juice.  Desserts, such as cakes, cookies, ice cream, and candy.  How does alcohol affect me? Alcohol can cause a sudden decrease in blood  glucose (hypoglycemia), especially if you use insulin or take certain oral diabetes medicines. Hypoglycemia can be a life-threatening condition. Symptoms of hypoglycemia (sleepiness, dizziness, and confusion) are similar to symptoms of having too much alcohol. If your health care provider says that alcohol is safe for you, follow these guidelines:  Limit alcohol intake to no more than 1 drink per day for nonpregnant women and 2 drinks per day for men. One drink equals 12 oz of beer, 5 oz of wine, or 1 oz of hard liquor.  Do not drink on an empty stomach.  Keep yourself hydrated with water, diet soda, or unsweetened iced tea.  Keep in mind that regular soda, juice, and other mixers may contain a lot of sugar and must be counted as carbohydrates.  What are tips for following this plan? Reading food labels  Start by checking the serving size on the label. The amount of calories, carbohydrates, fats, and other nutrients listed on the label are based on one serving of the food. Many foods contain more than one serving per package.  Check the total grams (g) of carbohydrates in one serving. You can calculate the number of servings of carbohydrates in one serving by dividing the total carbohydrates by 15. For example, if a food has 30 g of total carbohydrates, it would be equal to 2 servings of carbohydrates.  Check the number of grams (g) of saturated and trans fats in one serving. Choose foods that have low or no amount of these fats.  Check the number of milligrams (mg) of sodium in one serving. Most people   should limit total sodium intake to less than 2,300 mg per day.  Always check the nutrition information of foods labeled as "low-fat" or "nonfat". These foods may be higher in added sugar or refined carbohydrates and should be avoided.  Talk to your dietitian to identify your daily goals for nutrients listed on the label. Shopping  Avoid buying canned, premade, or processed foods. These  foods tend to be high in fat, sodium, and added sugar.  Shop around the outside edge of the grocery store. This includes fresh fruits and vegetables, bulk grains, fresh meats, and fresh dairy. Cooking  Use low-heat cooking methods, such as baking, instead of high-heat cooking methods like deep frying.  Cook using healthy oils, such as olive, canola, or sunflower oil.  Avoid cooking with butter, cream, or high-fat meats. Meal planning  Eat meals and snacks regularly, preferably at the same times every day. Avoid going long periods of time without eating.  Eat foods high in fiber, such as fresh fruits, vegetables, beans, and whole grains. Talk to your dietitian about how many servings of carbohydrates you can eat at each meal.  Eat 4-6 ounces of lean protein each day, such as lean meat, chicken, fish, eggs, or tofu. 1 ounce is equal to 1 ounce of meat, chicken, or fish, 1 egg, or 1/4 cup of tofu.  Eat some foods each day that contain healthy fats, such as avocado, nuts, seeds, and fish. Lifestyle   Check your blood glucose regularly.  Exercise at least 30 minutes 5 or more days each week, or as told by your health care provider.  Take medicines as told by your health care provider.  Do not use any products that contain nicotine or tobacco, such as cigarettes and e-cigarettes. If you need help quitting, ask your health care provider.  Work with a counselor or diabetes educator to identify strategies to manage stress and any emotional and social challenges. What are some questions to ask my health care provider?  Do I need to meet with a diabetes educator?  Do I need to meet with a dietitian?  What number can I call if I have questions?  When are the best times to check my blood glucose? Where to find more information:  American Diabetes Association: diabetes.org/food-and-fitness/food  Academy of Nutrition and Dietetics:  www.eatright.org/resources/health/diseases-and-conditions/diabetes  National Institute of Diabetes and Digestive and Kidney Diseases (NIH): www.niddk.nih.gov/health-information/diabetes/overview/diet-eating-physical-activity Summary  A healthy meal plan will help you control your blood glucose and maintain a healthy lifestyle.  Working with a diet and nutrition specialist (dietitian) can help you make a meal plan that is best for you.  Keep in mind that carbohydrates and alcohol have immediate effects on your blood glucose levels. It is important to count carbohydrates and to use alcohol carefully. This information is not intended to replace advice given to you by your health care provider. Make sure you discuss any questions you have with your health care provider. Document Released: 10/26/2004 Document Revised: 03/05/2016 Document Reviewed: 03/05/2016 Elsevier Interactive Patient Education  2018 [employer]  

## 2017-05-17 NOTE — Progress Notes (Signed)
Subjective:  Patient ID: Duane [name], male    DOB: 10-13-1975  Age: 42 y.o. MRN: [id number]  CC: Diabetes   HPI Duane [name]  is a 42 year old male with history of type 1 diabetes mellitus (A1c14.5), diabetic neuropathy, diabetic gastroparesis hypertension, stage III chronic kidney disease who presents today for a follow-up visit.  His A1c is 14.5 and this has trended up from 11.5 previously.  He continues to experience neuropathy in his feet and remains on gabapentin.  His med list reveals he should be taking 55 units of Lantus however he is taking 50 units and increase his NovoLog to 20 units 3 times daily. He was referred to endocrine and will be calling to schedule an appointment.   He also has blurry vision and was recently seen by [facility] care and referred for laser surgery.  The surgery comes up on 05/28/17.  Also suffers from gastroparesis and was seen by [facility] - [name] on 05/10/17; remains on Xifaxan for chronic diarrhea and Reglan for gastroparesis.  He complains of a sore in his right fifth toe which started several months ago when he picked at a corn and has noticed serous drainage from the sore, denies swelling, redness or fever; [facility]. He complains of cramps in his legs and thighs with no preceding history of trauma and he does not workout; he is unsure of the duration of symptoms. Also complains of excessive sweating with no duration as well on this is unrelated to activity. He is overwhelmed by his current medical problems and was prescribed an anxiolytic in the past which he never complied with and is unsure if he would like to be on something different as he thinks he currently takes too many medications.  He has been compliant with his antihypertensive and denies adverse effects. With regards to his chronic pedal edema he takes diuretics as needed but notices that after being on his feet for a long time his  pedal edema worsens; the left is greater than the right.   Past Medical History:  Diagnosis Date  . Depression   . Diabetes mellitus without complication [facility]) age 34   insulin requiring from the start  . Diabetic neuropathy ([place]) Dx 201[address]  . Gastritis   . Gastroparesis 2012   100% gastric retention on 08/2013 [facility]  . GERD (gastroesophageal reflux disease) Dx 2012  . Hypertension Dx 2012  . Obesity     Past Surgical History:  Procedure Laterality Date  . CHOLECYSTECTOMY  2013  . ESOPHAGOGASTRODUODENOSCOPY N/A 06/11/2013   Procedure: ESOPHAGOGASTRODUODENOSCOPY (EGD);  Surgeon: [name], MD;  Location: [facility];  Service: Endoscopy;  Laterality: N/A;  . FLEXIBLE SIGMOIDOSCOPY N/A 06/11/2013   Procedure: FLEXIBLE SIGMOIDOSCOPY;  Surgeon: [name], MD;  Location: [facility];  Service: Endoscopy;  Laterality: N/A;  . KNEE SURGERY Left     No Known Allergies   Outpatient Medications Prior to Visit  Medication Sig Dispense Refill  . atorvastatin (LIPITOR) 20 MG tablet Take 1 tablet (20 mg total) by mouth daily. 30 tablet 5  . Blood Glucose Monitoring Suppl (TRUE METRIX METER) W/DEVICE KIT 1 each by Does not apply route 4 (four) times daily -  before meals and at bedtime. 1 kit ea  . gabapentin (NEURONTIN) 300 MG capsule Take 1 capsule (300 mg total) by mouth at bedtime. 30 capsule 5  . glucose blood (TRUE METRIX BLOOD GLUCOSE TEST) test  strip 1 each by Other route 3 (three) times daily. 100 each 11  . insulin lispro (HUMALOG KWIKPEN) 100 UNIT/ML KiwkPen Inject 0.15 mLs (15 Units total) into the skin 3 (three) times daily. 45 mL 5  . Insulin Pen Needle 31G X 8 MM MISC Inject 1 each into the skin 4 (four) times daily. Check blood sugar TID and QHS 120 each 11  . lisinopril-hydrochlorothiazide (ZESTORETIC) 20-12.5 MG tablet Take 2 tablets by mouth daily. 60 tablet 5  . metoCLOPramide (REGLAN) 10 MG tablet TAKE 1 TABLET BY MOUTH 3 TIMES DAILY BEFORE MEALS AS NEEDED 90 tablet  5  . nortriptyline (PAMELOR) 50 MG capsule Take 1 capsule (50 mg total) by mouth at bedtime. 30 capsule 5  . promethazine (PHENERGAN) 25 MG tablet TAKE 1 TABLET BY MOUTH EVERY 8 HOURS AS NEEDED FOR NAUSEA OR VOMITING. 60 tablet 0  . rifaximin (XIFAXAN) 550 MG TABS tablet Take 1 tablet (550 mg total) by mouth 3 (three) times daily. 42 tablet 0  . sucralfate (CARAFATE) 1 g tablet Take 1 tablet (1 g total) by mouth 4 (four) times daily -  with meals and at bedtime. 120 tablet 2  . TRUEPLUS LANCETS 28G MISC 1 each by Does not apply route 4 (four) times daily -  before meals and at bedtime. 100 each 12  . diltiazem (CARDIZEM CD) 180 MG 24 hr capsule Take 1 capsule (180 mg total) by mouth daily. 30 capsule 5  . furosemide (LASIX) 40 MG tablet Take 1 tablet (40 mg total) by mouth daily. 30 tablet 3  . Insulin Glargine (LANTUS SOLOSTAR) 100 UNIT/ML Solostar Pen Inject 55 Units into the skin at bedtime. 90 mL 3  . pantoprazole (PROTONIX) 40 MG tablet Take 1 tablet (40 mg total) by mouth daily. 30 tablet 5  . potassium chloride (K-DUR,KLOR-CON) 10 MEQ tablet Take 1 tablet (10 mEq total) by mouth daily. 30 tablet 5  . famotidine (PEPCID) 20 MG tablet Take 1 tablet (20 mg total) by mouth 2 (two) times daily. (Patient not taking: Reported on 05/17/2017) 30 tablet 0  . tadalafil 2.5 MG TABS Take 1 tablet (2.5 mg total) by mouth daily. (Patient not taking: Reported on 05/17/2017) 30 tablet 5   No facility-administered medications prior to visit.     ROS Review of Systems  Constitutional: Negative for activity change and appetite change.  HENT: Negative for sinus pressure and sore throat.   Eyes: Negative for visual disturbance.  Respiratory: Negative for cough, chest tightness and shortness of breath.   Cardiovascular: Negative for chest pain and leg swelling.  Gastrointestinal: Negative for abdominal distention, abdominal pain, constipation and diarrhea.  Endocrine: Negative.   Genitourinary: Negative for  dysuria.  Musculoskeletal: Negative for joint swelling and myalgias.  Skin: Positive for rash.  Allergic/Immunologic: Negative.   Neurological: Negative for weakness, light-headedness and numbness.  Psychiatric/Behavioral: Negative for dysphoric mood and suicidal ideas.     Objective:  BP (!) 147/85   Pulse (!) 107   Temp 97.8 F (36.6 C) (Oral)   Ht 6' 4"  (1.93 m)   Wt (!) 346 lb (156.9 kg)   SpO2 100%   BMI 42.12 kg/m   BP/Weight 05/17/2017 05/10/2017 9/43/2761  Systolic BP 470 929 574  Diastolic BP 85 88 82  Wt. (Lbs) 346 343 347.13  BMI 42.12 41.75 42.25      Physical Exam  Constitutional: He is oriented to person, place, and time. He appears well-developed and well-nourished.  Cardiovascular: Normal rate,  normal heart sounds and intact distal pulses.  No murmur heard. Pulmonary/Chest: Effort normal and breath sounds normal. He has no wheezes. He has no rales. He exhibits no tenderness.  Abdominal: Soft. Bowel sounds are normal. He exhibits no distension and no mass. There is no tenderness.  Musculoskeletal: Normal range of motion. He exhibits edema (1+ b/l non pitting pedal edema).  Neurological: He is alert and oriented to person, place, and time.  Skin:  Right fifth toe ulcer limited to skin with no surrounding erythema or drainage  Psychiatric: He has a normal mood and affect.    CMP Latest Ref Rng & Units 08/20/2016 06/15/2016 03/30/2016  Glucose 65 - 99 mg/dL 208(H) 269(H) 205(H)  BUN 6 - 20 mg/dL 23(H) 20 14  Creatinine 0.76 - 1.27 mg/dL 1.79(H) 1.72(H) 1.45(H)  Sodium 134 - 144 mmol/L 139 139 139  Potassium 3.5 - 5.2 mmol/L 4.9 4.7 4.4  Chloride 96 - 106 mmol/L 100 98 105  CO2 20 - 29 mmol/L 20 24 25   Calcium 8.7 - 10.2 mg/dL 9.9 9.9 8.8  Total Protein 6.1 - 8.1 g/dL - - 6.7  Total Bilirubin 0.2 - 1.2 mg/dL - - 0.3  Alkaline Phos 40 - 115 U/L - - 95  AST 10 - 40 U/L - - 25  ALT 9 - 46 U/L - - 38     Lipid Panel     Component Value Date/Time   CHOL 149  07/07/2014 1036   TRIG 106 07/07/2014 1036   HDL 34 (L) 07/07/2014 1036   CHOLHDL 4.4 07/07/2014 1036   VLDL 21 07/07/2014 1036   LDLCALC 94 07/07/2014 1036     Lab Results  Component Value Date   HGBA1C 14.5 05/17/2017     Assessment & Plan:   1. Type 1 diabetes mellitus with diabetic polyneuropathy (HCC) Associated diabetic retinopathy Uncontrolled with A1c of 14.5 which has trended up from 11.5 previously Increase Lantus to 60 units at bedtime and continue current NovoLog regimen Advised to keep upcoming appointment with endocrine-Dr. Dwyane Dee Strongly encouraged to adhere with a diabetic diet as he has not been doing so Keep upcoming appointment with ophthalmology for nasal surgery - POCT glucose (manual entry) - POCT glycosylated hemoglobin (Hb A1C) - Lipid panel - CMP14+EGFR - Insulin Glargine (LANTUS SOLOSTAR) 100 UNIT/ML Solostar Pen; Inject 60 Units into the skin at bedtime.  Dispense: 90 mL; Refill: 3 - TSH - aluminum chloride (DRYSOL) 20 % external solution; Apply topically at bedtime.  Dispense: 35 mL; Refill: 1  2. Pedal edema Could be dependent versus secondary to CKD Low sodium, DASH diet - potassium chloride (K-DUR,KLOR-CON) 10 MEQ tablet; Take 1 tablet (10 mEq total) by mouth daily.  Dispense: 30 tablet; Refill: 5 - furosemide (LASIX) 40 MG tablet; Take 1 tablet (40 mg total) by mouth daily.  Dispense: 30 tablet; Refill: 3  3. Diabetic gastroparesis associated with type 1 diabetes mellitus ([place]) Uncontrolled Recently seen by GI Continue Reglan - pantoprazole (PROTONIX) 40 MG tablet; Take 1 tablet (40 mg total) by mouth daily.  Dispense: 30 tablet; Refill: 5  4. Essential hypertension Uncontrolled Currently taking lisinopril/HCTZ Refilled Cardizem which she has been out of for sometime for optimization of blood pressure - diltiazem (CARDIZEM CD) 180 MG 24 hr capsule; Take 1 capsule (180 mg total) by mouth daily.  Dispense: 30 capsule; Refill: 5  5.  Diabetic autonomic neuropathy associated with diabetes mellitus due to underlying condition (HCC) Continue gabapentin  6. Muscle spasm Will need  to check potassium levels He is not sure of duration and statin myopathy is a consideration Advised to hold off on statin for 2 weeks to assess for improvement in symptoms  7. Skin ulcer of right foot, limited to breakdown of skin ([place]) Advised to [facility] - cephALEXin (KEFLEX) 500 MG capsule; Take 1 capsule (500 mg total) by mouth 4 (four) times daily.  Dispense: 20 capsule; Refill: 0  8. Excessive sweating We will check TSH levels Placed on Drysol  9. Vitamin D deficiency - VITAMIN D 25 Hydroxy (Vit-D Deficiency, Fractures)  10.  Insomnia Currently taking nortriptyline which is not beneficial and has been educated on tapering instructions: 50 mg every other day then 50 mg every third day until discontinued He is reluctant to commencing trazodone and so I have advised him to commence OTC melatonin and if symptoms persist he knows to return to the clinic for a sedative.  Meds ordered this encounter  Medications  . potassium chloride (K-DUR,KLOR-CON) 10 MEQ tablet    Sig: Take 1 tablet (10 mEq total) by mouth daily.    Dispense:  30 tablet    Refill:  5  . pantoprazole (PROTONIX) 40 MG tablet    Sig: Take 1 tablet (40 mg total) by mouth daily.    Dispense:  30 tablet    Refill:  5  . furosemide (LASIX) 40 MG tablet    Sig: Take 1 tablet (40 mg total) by mouth daily.    Dispense:  30 tablet    Refill:  3  . Insulin Glargine (LANTUS SOLOSTAR) 100 UNIT/ML Solostar Pen    Sig: Inject 60 Units into the skin at bedtime.    Dispense:  90 mL    Refill:  3  . diltiazem (CARDIZEM CD) 180 MG 24 hr capsule    Sig: Take 1 capsule (180 mg total) by mouth daily.    Dispense:  30 capsule    Refill:  5  . aluminum chloride (DRYSOL) 20 % external solution    Sig: Apply topically at bedtime.    Dispense:  35 mL     Refill:  1  . cephALEXin (KEFLEX) 500 MG capsule    Sig: Take 1 capsule (500 mg total) by mouth 4 (four) times daily.    Dispense:  20 capsule    Refill:  0    Follow-up: Return in about 3 months (around 08/16/2017) for follow up of chronic medical conditions.   Charlott Rakes MD

## 2017-05-18 LAB — CMP14+EGFR
ALK PHOS: 177 IU/L — AB (ref 39–117)
ALT: 28 IU/L (ref 0–44)
AST: 17 IU/L (ref 0–40)
Albumin/Globulin Ratio: 1.3 (ref 1.2–2.2)
Albumin: 3.9 g/dL (ref 3.5–5.5)
BUN/Creatinine Ratio: 13 (ref 9–20)
BUN: 26 mg/dL — AB (ref 6–24)
Bilirubin Total: <0.2 mg/dL (ref 0.0–1.2)
CO2: 22 mmol/L (ref 20–29)
CREATININE: 1.95 mg/dL — AB (ref 0.76–1.27)
Calcium: 9.1 mg/dL (ref 8.7–10.2)
Chloride: 98 mmol/L (ref 96–106)
GFR calc Af Amer: 48 mL/min/{1.73_m2} — ABNORMAL LOW (ref >59)
GFR calc non Af Amer: 42 mL/min/{1.73_m2} — ABNORMAL LOW (ref >59)
GLUCOSE: 337 mg/dL — AB (ref 65–99)
Globulin, Total: 3.1 g/dL (ref 1.5–4.5)
Potassium: 4.6 mmol/L (ref 3.5–5.2)
Sodium: 138 mmol/L (ref 134–144)
Total Protein: 7 g/dL (ref 6.0–8.5)

## 2017-05-18 LAB — LIPID PANEL
CHOL/HDL RATIO: 4.8 ratio (ref 0.0–5.0)
Cholesterol, Total: 138 mg/dL (ref 100–199)
HDL: 29 mg/dL — ABNORMAL LOW (ref >39)
LDL CALC: 65 mg/dL (ref 0–99)
Triglycerides: 221 mg/dL — ABNORMAL HIGH (ref 0–149)
VLDL CHOLESTEROL CAL: 44 mg/dL — AB (ref 5–40)

## 2017-05-18 LAB — VITAMIN D 25 HYDROXY (VIT D DEFICIENCY, FRACTURES): Vit D, 25-Hydroxy: 6 ng/mL — ABNORMAL LOW (ref 30.0–100.0)

## 2017-05-18 LAB — TSH: TSH: 2.67 u[IU]/mL (ref 0.450–4.500)

## 2017-05-20 ENCOUNTER — Other Ambulatory Visit: Payer: Self-pay | Admitting: Family Medicine

## 2017-05-20 DIAGNOSIS — N182 Chronic kidney disease, stage 2 (mild): Secondary | ICD-10-CM

## 2017-05-20 MED ORDER — ERGOCALCIFEROL 1.25 MG (50000 UT) PO CAPS: 50000.0000 [IU] | ORAL_CAPSULE | ORAL | 1 refills | Status: DC

## 2017-05-27 DIAGNOSIS — M2041 Other hammer toe(s) (acquired), right foot: Secondary | ICD-10-CM | POA: Diagnosis not present

## 2017-05-27 DIAGNOSIS — M79672 Pain in left foot: Secondary | ICD-10-CM | POA: Diagnosis not present

## 2017-05-27 DIAGNOSIS — L97519 Non-pressure chronic ulcer of other part of right foot with unspecified severity: Secondary | ICD-10-CM | POA: Diagnosis not present

## 2017-05-27 DIAGNOSIS — M79671 Pain in right foot: Secondary | ICD-10-CM | POA: Diagnosis not present

## 2017-05-27 DIAGNOSIS — M2042 Other hammer toe(s) (acquired), left foot: Secondary | ICD-10-CM | POA: Diagnosis not present

## 2017-05-27 DIAGNOSIS — L97512 Non-pressure chronic ulcer of other part of right foot with fat layer exposed: Secondary | ICD-10-CM | POA: Diagnosis not present

## 2017-05-29 ENCOUNTER — Encounter (INDEPENDENT_AMBULATORY_CARE_PROVIDER_SITE_OTHER): Payer: Medicare Other | Admitting: Ophthalmology

## 2017-06-03 NOTE — Progress Notes (Signed)
[facility] - Clinic Note  06/04/2017     CHIEF COMPLAINT Patient presents for Diabetic Eye Exam   HISTORY OF PRESENT ILLNESS: Duane [name] is a 41 y.o. male who presents to the clinic today for:   HPI    Diabetic Eye Exam    Vision is stable.  Associated Symptoms Negative for Flashes, Distortion, Pain, Photophobia, Trauma, Jaw Claudication, Fever, Fatigue, Weight Loss, Shoulder/Hip pain, Scalp Tenderness, Glare, Redness, Blind Spot and Floaters.  Diabetes characteristics include Type 1 and on insulin.  This started 14 years ago.  Blood sugar level is uncontrolled.  Last Blood Glucose 280.  Last A1C 14.5.  Associated Diagnosis Neuropathy.  I, the attending physician,  performed the HPI with the patient and updated documentation appropriately.          Comments    Pt presents on the referral of Dr. S. Groat for concern of NPDR OU; Pt states he sees Dr. Groat on a regular basis; Pt states CBG has been very high since having cortizone shot in knees; Pt states last CBG was 280 this AM; Pt states last A1C was 14.5 (x "a couple weeks ago"); Pt reports he is on insulin; Pt states OU VA is stable; Pt states VA is slightly blurred at night; Pt states last eye exam with Dr. Groat "everything was normal"; Pt denies any floaters, denies flashes, denies wavy VA; Pt denies any gtts usage, denies taking any eye vits;        Last edited by , Michaeal, MD on 06/06/2017  8:22 AM. (History)      Referring physician: Groat, Richard Scott, MD [address] [place], [place] [address]  HISTORICAL INFORMATION:   Selected notes from the medical record:  Referred by Dr. Scott Groat for concern of NPDR OU LEE: 04.01.19 (S. Groat) [BCVA OD: 20/20 OS: 20/20] Ocular Hx- NPDR,  PMH- DM (on Insulin), Diabetic Neuropthy, HTN   CURRENT MEDICATIONS: No current outpatient medications on file. (Ophthalmic Drugs)   No current facility-administered medications for this visit.   (Ophthalmic Drugs)   Current Outpatient Medications (Other)  Medication Sig  . amLODipine (NORVASC) 5 MG tablet Take by mouth.  . lisinopril (PRINIVIL,ZESTRIL) 40 MG tablet Take by mouth.  . ondansetron (ZOFRAN-ODT) 4 MG disintegrating tablet Take by mouth.  . aluminum chloride (DRYSOL) 20 % external solution Apply topically at bedtime.  . atorvastatin (LIPITOR) 20 MG tablet Take 1 tablet (20 mg total) by mouth daily.  . Blood Glucose Monitoring Suppl (TRUE METRIX METER) W/DEVICE KIT 1 each by Does not apply route 4 (four) times daily -  before meals and at bedtime.  . cephALEXin (KEFLEX) 500 MG capsule Take 1 capsule (500 mg total) by mouth 4 (four) times daily.  . cloNIDine (CATAPRES) 0.1 MG tablet Take by mouth.  . diltiazem (CARDIZEM CD) 180 MG 24 hr capsule Take 1 capsule (180 mg total) by mouth daily.  . ergocalciferol (DRISDOL) 50000 units capsule Take 1 capsule (50,000 Units total) by mouth once a week.  . famotidine (PEPCID) 20 MG tablet Take 1 tablet (20 mg total) by mouth 2 (two) times daily. (Patient not taking: Reported on 05/17/2017)  . furosemide (LASIX) 40 MG tablet Take 1 tablet (40 mg total) by mouth daily.  . gabapentin (NEURONTIN) 300 MG capsule Take 1 capsule (300 mg total) by mouth at bedtime.  . glucose blood (TRUE METRIX BLOOD GLUCOSE TEST) test strip 1 each by Other route 3 (three) times   daily.  . insulin aspart (NOVOLOG) 100 UNIT/ML injection Inject into the skin.  . Insulin Glargine (LANTUS SOLOSTAR) 100 UNIT/ML Solostar Pen Inject 60 Units into the skin at bedtime.  . insulin lispro (HUMALOG KWIKPEN) 100 UNIT/ML KiwkPen Inject 0.15 mLs (15 Units total) into the skin 3 (three) times daily.  . Insulin Pen Needle 31G X 8 MM MISC Inject 1 each into the skin 4 (four) times daily. Check blood sugar TID and QHS  . lisinopril-hydrochlorothiazide (ZESTORETIC) 20-12.5 MG tablet Take 2 tablets by mouth daily.  . metoCLOPramide (REGLAN) 10 MG tablet TAKE 1 TABLET BY MOUTH 3 TIMES  DAILY BEFORE MEALS AS NEEDED  . metoprolol tartrate (LOPRESSOR) 50 MG tablet Take by mouth.  . nortriptyline (PAMELOR) 50 MG capsule Take 1 capsule (50 mg total) by mouth at bedtime.  . pantoprazole (PROTONIX) 40 MG tablet Take 1 tablet (40 mg total) by mouth daily.  . potassium chloride (K-DUR,KLOR-CON) 10 MEQ tablet Take 1 tablet (10 mEq total) by mouth daily.  . potassium chloride (MICRO-K) 10 MEQ CR capsule Take by mouth daily.  . promethazine (PHENERGAN) 25 MG tablet TAKE 1 TABLET BY MOUTH EVERY 8 HOURS AS NEEDED FOR NAUSEA OR VOMITING.  . rifaximin (XIFAXAN) 550 MG TABS tablet Take 1 tablet (550 mg total) by mouth 3 (three) times daily.  . sucralfate (CARAFATE) 1 g tablet Take 1 tablet (1 g total) by mouth 4 (four) times daily -  with meals and at bedtime.  . tadalafil 2.5 MG TABS Take 1 tablet (2.5 mg total) by mouth daily. (Patient not taking: Reported on 05/17/2017)  . TRUEPLUS LANCETS 28G MISC 1 each by Does not apply route 4 (four) times daily -  before meals and at bedtime.   No current facility-administered medications for this visit.  (Other)      REVIEW OF SYSTEMS: ROS    Positive for: Endocrine, Eyes   Negative for: Constitutional, Gastrointestinal, Neurological, Skin, Genitourinary, Musculoskeletal, HENT, Cardiovascular, Respiratory, Psychiatric, Allergic/Imm, Heme/Lymph   Last edited by Cherrie Gauze, COA on 06/04/2017  1:18 PM. (History)       ALLERGIES No Known Allergies  PAST MEDICAL HISTORY Past Medical History:  Diagnosis Date  . Depression   . Diabetes mellitus without complication [facility]) age 6   insulin requiring from the start  . Diabetic neuropathy ([place]) Dx [address]  . Gastritis   . Gastroparesis 2012   100% gastric retention on 08/2013 [facility]  . GERD (gastroesophageal reflux disease) Dx 2012  . Hypertension Dx 2012  . Obesity    Past Surgical History:  Procedure Laterality Date  . CHOLECYSTECTOMY  2013  . ESOPHAGOGASTRODUODENOSCOPY N/A 06/11/2013    Procedure: ESOPHAGOGASTRODUODENOSCOPY (EGD);  Surgeon: [name], MD;  Location: [facility];  Service: Endoscopy;  Laterality: N/A;  . FLEXIBLE SIGMOIDOSCOPY N/A 06/11/2013   Procedure: FLEXIBLE SIGMOIDOSCOPY;  Surgeon: [name], MD;  Location: [facility];  Service: Endoscopy;  Laterality: N/A;  . KNEE SURGERY Left     FAMILY HISTORY Family History  Problem Relation Age of Onset  . Diabetes Father   . Diabetes Maternal Grandmother   . Diabetes Maternal Grandfather   . Colon cancer Neg Hx   . Stomach cancer Neg Hx     SOCIAL HISTORY Social History   Tobacco Use  . Smoking status: Never Smoker  . Smokeless tobacco: Never Used  Substance Use Topics  . Alcohol use: No  . Drug use: No         OPHTHALMIC  EXAM:  Base Eye Exam    Visual Acuity (Snellen - Linear)      Right Left   Dist [place] 20/20 20/20   Dist ph [place] NI NI       Tonometry (Tonopen, 1:28 PM)      Right Left   Pressure 17 17       Pupils      Dark Light Shape React APD   Right 4 3 Round Brisk None   Left 4 3 Round Brisk None       Visual Fields (Counting fingers)      Left Right    Full Full       Extraocular Movement      Right Left    Full, Ortho Full, Ortho       Neuro/Psych    Oriented x3:  Yes   Mood/Affect:  Normal       Dilation    Both eyes:  1.0% Mydriacyl, 2.5% Phenylephrine @ 1:28 PM        Slit Lamp and Fundus Exam    Slit Lamp Exam      Right Left   Lids/Lashes Mild Scurf Mild Scurf   Conjunctiva/Sclera White and quiet White and quiet   Cornea Clear Clear   Anterior Chamber Deep and quiet, no cell or flare Deep and quiet, no cell or flare   Iris Round and dilated, No NVI Round and dilated, No NVI   Lens Clear Clear   Vitreous Very mild Vitreous syneresis Very mild Vitreous syneresis       Fundus Exam      Right Left   Disc Pink and Sharp, mild temporal thnning, mild Temporal Peripapillary atrophy Pink and Sharp, mild Temporal Peripapillary atrophy   C/D  Ratio 0.6 0.5   Macula Good foveal reflex, Superior Exudates, scattered DBH Good foveal reflex, scattered Microaneurysms, few IN Exudates   Vessels Mild Copper wiring, mildly Tortuous Mild Copper wiring, mildly Tortuous; early flat NV superior to ST arcades   Periphery Attached, scattered dot hemes, CWS along IT arcade Attached, cluster of DBH along ST arcade, scatted DBH        Refraction    Manifest Refraction      Sphere Dist VA   Right [place] 20/20   Left [place] 20/20          IMAGING AND PROCEDURES  Imaging and Procedures for 06/06/17  OCT, Retina - OU - Both Eyes       Right Eye Quality was good. Central Foveal Thickness: 245. Progression has no prior data. Findings include normal foveal contour, no SRF, intraretinal fluid (Small patch of cystic changes/IRF ST and temporal to fovea).   Left Eye Quality was good. Central Foveal Thickness: 244. Progression has no prior data. Findings include normal foveal contour, no IRF, no SRF.   Notes *Images captured and stored on drive  Diagnosis / Impression:  OD: trace DME extrafoveal OS: NFP, No IRF/SRF, No DME  Clinical management:  See below  Abbreviations: NFP - Normal foveal profile. CME - cystoid macular edema. PED - pigment epithelial detachment. IRF - intraretinal fluid. SRF - subretinal fluid. EZ - ellipsoid zone. ERM - epiretinal membrane. ORA - outer retinal atrophy. ORT - outer retinal tubulation. SRHM - subretinal hyper-reflective material         Fluorescein Angiography Optos (Transit OD)       Right Eye Progression has no prior data. Early phase findings include microaneurysm, vascular perfusion defect. Mid/Late phase findings   include microaneurysm, vascular perfusion defect, leakage (No NV).   Left Eye Progression has no prior data. Early phase findings include microaneurysm, vascular perfusion defect, retinal neovascularization, leakage. Mid/Late phase findings include microaneurysm, leakage, retinal  neovascularization, vascular perfusion defect.   Notes Impression:  OD: Moderate to severe NPDR OS: PDR                ASSESSMENT/PLAN:    ICD-10-CM   1. Proliferative diabetic retinopathy of left eye without macular edema associated with type 2 diabetes mellitus (HCC) E11.3592 Fluorescein Angiography Optos (Transit OD)  2. Moderate nonproliferative diabetic retinopathy of right eye with macular edema associated with type 2 diabetes mellitus (HCC) E11.3311 Fluorescein Angiography Optos (Transit OD)  3. Retinal edema H35.81 OCT, Retina - OU - Both Eyes  4. Essential hypertension I10   5. Hypertensive retinopathy of both eyes H35.033 Fluorescein Angiography Optos (Transit OD)    1. Early PDR without DME OS - The incidence, risk factors for progression, natural history and treatment options for diabetic retinopathy were discussed with patient.   - The need for close monitoring of blood glucose, blood pressure, and serum lipids, avoiding cigarette or any type of tobacco, and the need for long term follow up was also discussed with patient. - exam shows scattered Mas, DBH, and early flat NV superior to ST arcades with surrounding clusters of DBH - FA OS today (4.23.19) shows early NV w/ leakage corresponding to early NV on exam -- superior to ST arcades - OCT without diabetic macular edema OS - VA remains 20/20 uncorrected OU - discussed findings and prognosis - recommend light PRP OS - will bring back in 2-4 wks for PRP OS  2,3. Moderate nonproliferative diabetic retinopathy w/ non-central DME, OD - The incidence, risk factors for progression, natural history and treatment options for diabetic retinopathy were discussed with patient.   - The need for close monitoring of blood glucose, blood pressure, and serum lipids, avoiding cigarette or any type of tobacco, and the need for long term follow up was also discussed with patient. - exam w/ scattered IRH - FA OD today 4.23.19 shows  patches of capillary nonperfusion but no NV  - OCT with non-central macular edema OD - VA 20/20 - f/u in 2-3 mos  4,5. Hypertensive retinopathy OU - discussed importance of tight BP control - monitor   Ophthalmic Meds Ordered this visit:  No orders of the defined types were placed in this encounter.      Return for 2-4 wks, DR f/u, Laser.  There are no Patient Instructions on file for this visit.   Explained the diagnoses, plan, and follow up with the patient and they expressed understanding.  Patient expressed understanding of the importance of proper follow up care.   This document serves as a record of services personally performed by Eithen G. Jezel Basto, MD, PhD. It was created on their behalf by Amanda Brown, OA, an ophthalmic assistant. The creation of this record is the provider's dictation and/or activities during the visit.    Electronically signed by: Amanda Brown, OA  06/06/17 8:22 AM    Demaurion G. Aithan Farrelly, M.D., Ph.D. Diseases & Surgery of the Retina and Vitreous [facility] 06/06/17   I have reviewed the above documentation for accuracy and completeness, and I agree with the above. Aden G. Bentlie Withem, M.D., Ph.D. 06/06/17 8:22 AM    Abbreviations: M myopia (nearsighted); A astigmatism; H hyperopia (farsighted); P presbyopia; Mrx spectacle prescription;  CTL   contact lenses; OD right eye; OS left eye; OU both eyes  XT exotropia; ET esotropia; PEK punctate epithelial keratitis; PEE punctate epithelial erosions; DES dry eye syndrome; MGD meibomian gland dysfunction; ATs artificial tears; PFAT's preservative free artificial tears; NSC nuclear sclerotic cataract; PSC posterior subcapsular cataract; ERM epi-retinal membrane; PVD posterior vitreous detachment; RD retinal detachment; DM diabetes mellitus; DR diabetic retinopathy; NPDR non-proliferative diabetic retinopathy; PDR proliferative diabetic retinopathy; CSME clinically significant macular edema; DME  diabetic macular edema; dbh dot blot hemorrhages; CWS cotton wool spot; POAG primary open angle glaucoma; C/D cup-to-disc ratio; HVF humphrey visual field; GVF goldmann visual field; OCT optical coherence tomography; IOP intraocular pressure; BRVO Branch retinal vein occlusion; CRVO central retinal vein occlusion; CRAO central retinal artery occlusion; BRAO branch retinal artery occlusion; RT retinal tear; SB scleral buckle; PPV pars plana vitrectomy; VH Vitreous hemorrhage; PRP panretinal laser photocoagulation; IVK intravitreal kenalog; VMT vitreomacular traction; MH Macular hole;  NVD neovascularization of the disc; NVE neovascularization elsewhere; AREDS age related eye disease study; ARMD age related macular degeneration; POAG primary open angle glaucoma; EBMD epithelial/anterior basement membrane dystrophy; ACIOL anterior chamber intraocular lens; IOL intraocular lens; PCIOL posterior chamber intraocular lens; Phaco/IOL phacoemulsification with intraocular lens placement; PRK photorefractive keratectomy; LASIK laser assisted in situ keratomileusis; HTN hypertension; DM diabetes mellitus; COPD chronic obstructive pulmonary disease  

## 2017-06-04 ENCOUNTER — Ambulatory Visit (INDEPENDENT_AMBULATORY_CARE_PROVIDER_SITE_OTHER): Payer: Medicare Other | Admitting: Ophthalmology

## 2017-06-04 ENCOUNTER — Encounter (INDEPENDENT_AMBULATORY_CARE_PROVIDER_SITE_OTHER): Payer: Self-pay | Admitting: Ophthalmology

## 2017-06-04 DIAGNOSIS — H3581 Retinal edema: Secondary | ICD-10-CM | POA: Diagnosis not present

## 2017-06-04 DIAGNOSIS — I10 Essential (primary) hypertension: Secondary | ICD-10-CM | POA: Diagnosis not present

## 2017-06-04 DIAGNOSIS — E113311 Type 2 diabetes mellitus with moderate nonproliferative diabetic retinopathy with macular edema, right eye: Secondary | ICD-10-CM

## 2017-06-04 DIAGNOSIS — H35033 Hypertensive retinopathy, bilateral: Secondary | ICD-10-CM

## 2017-06-04 DIAGNOSIS — E113592 Type 2 diabetes mellitus with proliferative diabetic retinopathy without macular edema, left eye: Secondary | ICD-10-CM | POA: Diagnosis not present

## 2017-06-12 MED FILL — GABAPENTIN 300 MG CAPSULE: 300 | 30 days supply | Qty: 30 | Fill #4

## 2017-06-12 MED FILL — ATORVASTATIN 20 MG TABLET: 20 | 30 days supply | Qty: 30 | Fill #4

## 2017-06-19 MED FILL — POTASSIUM CL ER 10 MEQ CAP: 10 | 30 days supply | Qty: 30 | Fill #5

## 2017-06-19 MED FILL — PANTOPRAZOLE SOD DR 40 MG T: 40 | 30 days supply | Qty: 30 | Fill #1

## 2017-06-19 MED FILL — METOCLOPRAMIDE 10 MG TABLET: 10 | 30 days supply | Qty: 90 | Fill #4

## 2017-06-19 MED FILL — CARTIA XT 180 MG CAPSULE SA: 180 | 30 days supply | Qty: 30 | Fill #1

## 2017-06-21 MED FILL — $HUMALOG 100 UNITS/ML KWIKP: 100 | 33 days supply | Qty: 15 | Fill #6

## 2017-06-21 MED FILL — $LANTUS SOLOSTAR 100 UNITS/: 100 | 32 days supply | Qty: 18 | Fill #6

## 2017-06-21 NOTE — Progress Notes (Addendum)
[facility] Note  06/24/2017      CHIEF COMPLAINT Patient presents for Retina Follow Up   HISTORY OF PRESENT ILLNESS: Duane [name] is a 42 y.o. male who presents to the clinic today for:   HPI    Retina Follow Up    Patient presents with  Diabetic Retinopathy.  In left eye.  This started 2 weeks ago.  Severity is mild.  Since onset it is stable.  I, the attending physician,  performed the HPI with the patient and updated documentation appropriately.          Comments    F/U diabetic retinopathy os w/o mac. Edema. Patient states "his vision is good until his bs goes up then his vision becomes blurry" Bs 190 this am. Denies wavy VA , flashes and ocular pain.       Last edited by Bernarda Caffey, MD on 06/24/2017  1:49 PM. (History)      Referring physician: Debbra Riding, MD [address] [place], [place] [address]  HISTORICAL INFORMATION:   Selected notes from the MEDICAL RECORD NUMBER Referred by Dr. Wyatt Portela for concern of NPDR OU LEE: 04.01.19 (S. Groat) [[facility] OD: 20/20 OS: 20/20] Ocular Hx- NPDR,  PMH- DM (on Insulin), Diabetic Neuropthy, HTN   CURRENT MEDICATIONS: Current Outpatient Medications (Ophthalmic Drugs)  Medication Sig  . prednisoLONE acetate (PRED FORTE) 1 % ophthalmic suspension Place 1 drop into the left eye 4 (four) times daily for 7 days.   No current facility-administered medications for this visit.  (Ophthalmic Drugs)   Current Outpatient Medications (Other)  Medication Sig  . aluminum chloride (DRYSOL) 20 % external solution Apply topically at bedtime.  Marland Kitchen amLODipine (NORVASC) 5 MG tablet Take by mouth.  Marland Kitchen atorvastatin (LIPITOR) 20 MG tablet Take 1 tablet (20 mg total) by mouth daily.  . Blood Glucose Monitoring Suppl (TRUE METRIX METER) W/DEVICE KIT 1 each by Does not apply route 4 (four) times daily -  before meals and at bedtime.  . cephALEXin (KEFLEX) 500 MG capsule Take 1 capsule (500 mg total) by  mouth 4 (four) times daily.  . cloNIDine (CATAPRES) 0.1 MG tablet Take by mouth.  . diltiazem (CARDIZEM CD) 180 MG 24 hr capsule Take 1 capsule (180 mg total) by mouth daily.  . ergocalciferol (DRISDOL) 50000 units capsule Take 1 capsule (50,000 Units total) by mouth once a week.  . famotidine (PEPCID) 20 MG tablet Take 1 tablet (20 mg total) by mouth 2 (two) times daily.  . furosemide (LASIX) 40 MG tablet Take 1 tablet (40 mg total) by mouth daily.  Marland Kitchen gabapentin (NEURONTIN) 300 MG capsule Take 1 capsule (300 mg total) by mouth at bedtime.  Marland Kitchen glucose blood (TRUE METRIX BLOOD GLUCOSE TEST) test strip 1 each by Other route 3 (three) times daily.  . insulin aspart (NOVOLOG) 100 UNIT/ML injection Inject into the skin.  . Insulin Glargine (LANTUS SOLOSTAR) 100 UNIT/ML Solostar Pen Inject 60 Units into the skin at bedtime.  . insulin lispro (HUMALOG KWIKPEN) 100 UNIT/ML KiwkPen Inject 0.15 mLs (15 Units total) into the skin 3 (three) times daily.  . Insulin Pen Needle 31G X 8 MM MISC Inject 1 each into the skin 4 (four) times daily. Check blood sugar TID and QHS  . lisinopril (PRINIVIL,ZESTRIL) 40 MG tablet Take by mouth.  Marland Kitchen lisinopril-hydrochlorothiazide (ZESTORETIC) 20-12.5 MG tablet Take 2 tablets by mouth daily.  . metoCLOPramide (REGLAN) 10 MG tablet TAKE 1  TABLET BY MOUTH 3 TIMES DAILY BEFORE MEALS AS NEEDED  . metoprolol tartrate (LOPRESSOR) 50 MG tablet Take by mouth.  . nortriptyline (PAMELOR) 50 MG capsule Take 1 capsule (50 mg total) by mouth at bedtime.  . ondansetron (ZOFRAN-ODT) 4 MG disintegrating tablet Take by mouth.  . pantoprazole (PROTONIX) 40 MG tablet Take 1 tablet (40 mg total) by mouth daily.  . potassium chloride (K-DUR,KLOR-CON) 10 MEQ tablet Take 1 tablet (10 mEq total) by mouth daily.  . potassium chloride (MICRO-K) 10 MEQ CR capsule Take by mouth daily.  . promethazine (PHENERGAN) 25 MG tablet TAKE 1 TABLET BY MOUTH EVERY 8 HOURS AS NEEDED FOR NAUSEA OR VOMITING.  .  rifaximin (XIFAXAN) 550 MG TABS tablet Take 1 tablet (550 mg total) by mouth 3 (three) times daily.  . sucralfate (CARAFATE) 1 g tablet Take 1 tablet (1 g total) by mouth 4 (four) times daily -  with meals and at bedtime.  . tadalafil 2.5 MG TABS Take 1 tablet (2.5 mg total) by mouth daily.  . TRUEPLUS LANCETS 28G MISC 1 each by Does not apply route 4 (four) times daily -  before meals and at bedtime.   No current facility-administered medications for this visit.  (Other)      REVIEW OF SYSTEMS: ROS    Positive for: Endocrine, Eyes   Negative for: Constitutional, Gastrointestinal, Neurological, Skin, Genitourinary, Musculoskeletal, HENT, Cardiovascular, Respiratory, Psychiatric, Allergic/Imm, Heme/Lymph   Last edited by Zenovia Jordan, LPN on 2/95/2841  3:24 PM. (History)       ALLERGIES No Known Allergies  PAST MEDICAL HISTORY Past Medical History:  Diagnosis Date  . Depression   . Diabetes mellitus without complication [facility]) age 32   insulin requiring from the start  . Diabetic neuropathy ([place]) Dx 201[address]  . Gastritis   . Gastroparesis 2012   100% gastric retention on 08/2013 [facility]  . GERD (gastroesophageal reflux disease) Dx 2012  . Hypertension Dx 2012  . Obesity    Past Surgical History:  Procedure Laterality Date  . CHOLECYSTECTOMY  2013  . ESOPHAGOGASTRODUODENOSCOPY N/A 06/11/2013   Procedure: ESOPHAGOGASTRODUODENOSCOPY (EGD);  Surgeon: [name], MD;  Location: [facility];  Service: Endoscopy;  Laterality: N/A;  . FLEXIBLE SIGMOIDOSCOPY N/A 06/11/2013   Procedure: FLEXIBLE SIGMOIDOSCOPY;  Surgeon: [name], MD;  Location: [facility];  Service: Endoscopy;  Laterality: N/A;  . KNEE SURGERY Left     FAMILY HISTORY Family History  Problem Relation Age of Onset  . Diabetes Father   . Diabetes Maternal Grandmother   . Diabetes Maternal Grandfather   . Colon cancer Neg Hx   . Stomach cancer Neg Hx     SOCIAL HISTORY Social History   Tobacco Use   . Smoking status: Never Smoker  . Smokeless tobacco: Never Used  Substance Use Topics  . Alcohol use: No  . Drug use: No         OPHTHALMIC EXAM:  Base Eye Exam    Visual Acuity (Snellen - Linear)      Right Left   Dist [place] 20/20 -2 20/20   Dist ph [place] 20/20        Tonometry (Tonopen, 1:35 PM)      Right Left   Pressure 15 16       Pupils      Dark Light Shape React APD   Right 4 3 Round Brisk None   Left 4 3 Round Brisk None       Visual Fields (  Counting fingers)      Left Right    Full Full       Extraocular Movement      Right Left    Full, Ortho Full, Ortho       Neuro/Psych    Oriented x3:  Yes   Mood/Affect:  Normal       Dilation    Left eye:  1.0% Mydriacyl, 2.5% Phenylephrine @ 1:37 PM        Slit Lamp and Fundus Exam    Slit Lamp Exam      Right Left   Lids/Lashes Mild Scurf Mild Scurf   Conjunctiva/Sclera White and quiet White and quiet   Cornea Clear Clear   Anterior Chamber Deep and quiet, no cell or flare Deep and quiet, no cell or flare   Iris Round and dilated, No NVI Round and dilated, No NVI   Lens Clear Clear   Vitreous Very mild Vitreous syneresis Very mild Vitreous syneresis       Fundus Exam      Right Left   Disc  Pink and Sharp, mild Temporal Peripapillary atrophy   C/D Ratio 0.6 0.5   Macula  Good foveal reflex, scattered Microaneurysms, few IN Exudates   Vessels  Mild Copper wiring, mildly Tortuous; early flat NV superior to ST arcades   Periphery  Attached, cluster of DBH along ST arcade, scatted DBH          IMAGING AND PROCEDURES  Imaging and Procedures for 06/06/17  OCT, Retina - OU - Both Eyes       Right Eye Quality was good. Central Foveal Thickness: 241. Progression has been stable. Findings include normal foveal contour, no SRF, intraretinal fluid (Small patch of cystic changes/IRF ST and temporal to fovea).   Left Eye Quality was good. Central Foveal Thickness: 245. Progression has been stable.  Findings include normal foveal contour, no IRF, no SRF.   Notes *Images captured and stored on drive  Diagnosis / Impression:  OD: trace DME extrafoveal -- stable from prior OS: NFP, No IRF/SRF, No DME  Clinical management:  See below  Abbreviations: NFP - Normal foveal profile. CME - cystoid macular edema. PED - pigment epithelial detachment. IRF - intraretinal fluid. SRF - subretinal fluid. EZ - ellipsoid zone. ERM - epiretinal membrane. ORA - outer retinal atrophy. ORT - outer retinal tubulation. SRHM - subretinal hyper-reflective material         Panretinal Photocoagulation - OS - Left Eye       LASER PROCEDURE NOTE  Diagnosis:   Proliferative Diabetic Retinopathy, LEFT EYE  Procedure:  Pan-retinal photocoagulation using slit lamp laser, LEFT EYE  Anesthesia:  Topical  Surgeon: Bernarda Caffey, MD, PhD   Informed consent obtained, operative eye marked, and time out performed prior to initiation of laser.   Lumenis Smart532 slit lamp laser Pattern: 3x3 square, 1.5 spacing Power: 220 mW Duration: 30 msec  Spot size: 200 microns  # spots: 3546 spots   Complications: None.  RTC: 6 wks  Patient tolerated the procedure well and received written and verbal post-procedure care information/education.                ASSESSMENT/PLAN:    ICD-10-CM   1. Proliferative diabetic retinopathy of left eye without macular edema associated with type 2 diabetes mellitus ([place]) F68.1275 Panretinal Photocoagulation - OS - Left Eye  2. Moderate nonproliferative diabetic retinopathy of right eye with macular edema associated with type 2 diabetes mellitus ([place]) T70.0174  3. Retinal edema H35.81 OCT, Retina - OU - Both Eyes  4. Essential hypertension I10   5. Hypertensive retinopathy of both eyes H35.033     1. Early PDR without DME OS - The incidence, risk factors for progression, natural history and treatment options for diabetic retinopathy were discussed with patient.    - The need for close monitoring of blood glucose, blood pressure, and serum lipids, avoiding cigarette or any type of tobacco, and the need for long term follow up was also discussed with patient. - exam shows scattered Mas, DBH, and early flat NV superior to ST arcades with surrounding clusters of DBH - FA OS (4.23.19) shows early NV w/ leakage corresponding to early NV on exam -- superior to ST arcades - OCT without diabetic macular edema OS - VA remains 20/20 uncorrected OU - discussed findings and prognosis - recommend light PRP OS - pt wishes to proceed with PRP today (05.13.19) - RBA of procedure discussed, questions answered - informed consent obtained and signed - see procedure note - F/U in 6 wks   2,3. Moderate nonproliferative diabetic retinopathy w/ non-central DME, OD - The incidence, risk factors for progression, natural history and treatment options for diabetic retinopathy were discussed with patient.   - The need for close monitoring of blood glucose, blood pressure, and serum lipids, avoiding cigarette or any type of tobacco, and the need for long term follow up was also discussed with patient. - exam w/ scattered IRH - FA OD 4.23.19 shows patches of capillary nonperfusion but no NV  - OCT with non-central macular edema OD - VA 20/20 -- stable - F/U in 2-3 months  4,5. Hypertensive retinopathy OU - discussed importance of tight BP control - monitor   Ophthalmic Meds Ordered this visit:  Meds ordered this encounter  Medications  . prednisoLONE acetate (PRED FORTE) 1 % ophthalmic suspension    Sig: Place 1 drop into the left eye 4 (four) times daily for 7 days.    Dispense:  10 mL    Refill:  0       Return in about 6 weeks (around 08/05/2017) for F/U PDR OS, DFE, OCT.  There are no Patient Instructions on file for this visit.   Explained the diagnoses, plan, and follow up with the patient and they expressed understanding.  Patient expressed understanding  of the importance of proper follow up care.   This document serves as a record of services personally performed by Gardiner Sleeper, MD, PhD. It was created on their behalf by Catha Brow, [place], a certified ophthalmic assistant. The creation of this record is the provider's dictation and/or activities during the visit.  Electronically signed by: Catha Brow, [place]  05.10.19 2:47 PM    Gardiner Sleeper, M.D., Ph.D. Diseases & Surgery of the Retina and Vitreous [facility] 06/21/17  I have reviewed the above documentation for accuracy and completeness, and I agree with the above. Gardiner Sleeper, M.D., Ph.D. 06/24/17 2:47 PM    Abbreviations: M myopia (nearsighted); A astigmatism; H hyperopia (farsighted); P presbyopia; Mrx spectacle prescription;  CTL contact lenses; OD right eye; OS left eye; OU both eyes  XT exotropia; ET esotropia; PEK punctate epithelial keratitis; PEE punctate epithelial erosions; DES dry eye syndrome; MGD meibomian gland dysfunction; ATs artificial tears; PFAT's preservative free artificial tears; [place] nuclear sclerotic cataract; PSC posterior subcapsular cataract; ERM epi-retinal membrane; PVD posterior vitreous detachment; RD retinal detachment; DM diabetes mellitus; DR diabetic  retinopathy; NPDR non-proliferative diabetic retinopathy; PDR proliferative diabetic retinopathy; CSME clinically significant macular edema; DME diabetic macular edema; dbh dot blot hemorrhages; CWS cotton wool spot; POAG primary open angle glaucoma; C/D cup-to-disc ratio; HVF humphrey visual field; GVF goldmann visual field; OCT optical coherence tomography; IOP intraocular pressure; BRVO Branch retinal vein occlusion; CRVO central retinal vein occlusion; CRAO central retinal artery occlusion; BRAO branch retinal artery occlusion; RT retinal tear; SB scleral buckle; PPV pars plana vitrectomy; VH Vitreous hemorrhage; PRP panretinal laser photocoagulation; IVK intravitreal  kenalog; VMT vitreomacular traction; MH Macular hole;  NVD neovascularization of the disc; NVE neovascularization elsewhere; AREDS age related eye disease study; ARMD age related macular degeneration; POAG primary open angle glaucoma; EBMD epithelial/anterior basement membrane dystrophy; ACIOL anterior chamber intraocular lens; IOL intraocular lens; PCIOL posterior chamber intraocular lens; Phaco/IOL phacoemulsification with intraocular lens placement; [place] photorefractive keratectomy; LASIK laser assisted in situ keratomileusis; HTN hypertension; DM diabetes mellitus; COPD chronic obstructive pulmonary disease

## 2017-06-24 ENCOUNTER — Ambulatory Visit (INDEPENDENT_AMBULATORY_CARE_PROVIDER_SITE_OTHER): Payer: Medicare Other | Admitting: Ophthalmology

## 2017-06-24 ENCOUNTER — Encounter (INDEPENDENT_AMBULATORY_CARE_PROVIDER_SITE_OTHER): Payer: Self-pay | Admitting: Ophthalmology

## 2017-06-24 DIAGNOSIS — E113592 Type 2 diabetes mellitus with proliferative diabetic retinopathy without macular edema, left eye: Secondary | ICD-10-CM | POA: Diagnosis not present

## 2017-06-24 DIAGNOSIS — H35033 Hypertensive retinopathy, bilateral: Secondary | ICD-10-CM

## 2017-06-24 DIAGNOSIS — H3581 Retinal edema: Secondary | ICD-10-CM

## 2017-06-24 DIAGNOSIS — E113311 Type 2 diabetes mellitus with moderate nonproliferative diabetic retinopathy with macular edema, right eye: Secondary | ICD-10-CM

## 2017-06-24 DIAGNOSIS — I10 Essential (primary) hypertension: Secondary | ICD-10-CM

## 2017-06-24 MED ORDER — PREDNISOLONE ACETATE 1 % OP SUSP: 1.0000 [drp] | Freq: Four times a day (QID) | OPHTHALMIC | 0 refills | Status: AC

## 2017-06-24 MED FILL — PREDNISOLONE AC 1% EYE DROP: 1 | 30 days supply | Qty: 10 | Fill #0

## 2017-06-27 ENCOUNTER — Telehealth: Payer: Self-pay | Admitting: Family Medicine

## 2017-06-27 NOTE — Telephone Encounter (Signed)
6 page paperwork received through fax 06-27-17.

## 2017-07-03 MED FILL — LISINOPRIL-HCTZ 20-12.5 MG: 20-12.5 | 30 days supply | Qty: 60 | Fill #2

## 2017-07-03 MED FILL — SUCRALFATE 1 GM TABLET: 1 | 30 days supply | Qty: 120 | Fill #1

## 2017-07-16 ENCOUNTER — Encounter (INDEPENDENT_AMBULATORY_CARE_PROVIDER_SITE_OTHER): Payer: Medicare Other | Admitting: Ophthalmology

## 2017-07-16 ENCOUNTER — Other Ambulatory Visit: Payer: Self-pay | Admitting: Family Medicine

## 2017-07-16 DIAGNOSIS — R600 Localized edema: Secondary | ICD-10-CM

## 2017-07-16 MED FILL — PANTOPRAZOLE SOD DR 40 MG T: 40 | 30 days supply | Qty: 30 | Fill #2

## 2017-07-16 MED FILL — GABAPENTIN 300 MG CAPSULE: 300 | 30 days supply | Qty: 30 | Fill #5

## 2017-07-16 MED FILL — CARTIA XT 180 MG CAPSULE SA: 180 | 30 days supply | Qty: 30 | Fill #2

## 2017-07-17 MED FILL — POTASSIUM CL ER 10 MEQ CAP: 10 | 30 days supply | Qty: 30 | Fill #0

## 2017-07-24 MED FILL — $LANTUS SOLOSTAR 100 UNITS/: 100 | 32 days supply | Qty: 18 | Fill #7

## 2017-07-24 MED FILL — METOCLOPRAMIDE 10 MG TABLET: 10 | 30 days supply | Qty: 90 | Fill #5

## 2017-07-24 MED FILL — ATORVASTATIN 20 MG TABLET: 20 | 30 days supply | Qty: 30 | Fill #5

## 2017-07-24 MED FILL — $HUMALOG 100 UNITS/ML KWIKP: 100 | 33 days supply | Qty: 15 | Fill #7

## 2017-08-01 NOTE — Progress Notes (Signed)
[facility] Note  08/05/2017      CHIEF COMPLAINT Patient presents for Retina Follow Up   HISTORY OF PRESENT ILLNESS: Duane [name] is a 42 y.o. male who presents to the clinic today for:   HPI    Retina Follow Up    Patient presents with  Other.  In right eye.  This started 1 month ago.  Severity is mild.  Since onset it is stable.  I, the attending physician,  performed the HPI with the patient and updated documentation appropriately.          Comments    F/U PDR OS .Patient states vision is about the same, he reports having eye soreness after laser tx last ov, soreness faded away within a few days per patient. Pt reports having allergy's today, he's not sure if he wants to have [place] today"my eyes are watery and I'm coughing and sneezing a lot". Pt BS 230 this am, BS have been fluctuating.        Last edited by Bernarda Caffey, MD on 08/05/2017  2:40 PM. (History)    Pt states CBG has been elevated; Pt states last A1C was 13; Pt states he is on 60 units of lantus at bedtime and 15 units of humalog at meal times;   Referring physician: Charlott Rakes, MD [place], [place] [address]  HISTORICAL INFORMATION:   Selected notes from the MEDICAL RECORD NUMBER Referred by Dr. Wyatt Portela for concern of NPDR OU LEE: 04.01.19 (S. [place]) [BCVA OD: 20/20 OS: 20/20] Ocular Hx- NPDR,  PMH- DM (on Insulin), Diabetic Neuropthy, HTN   CURRENT MEDICATIONS: No current outpatient medications on file. (Ophthalmic Drugs)   No current facility-administered medications for this visit.  (Ophthalmic Drugs)   Current Outpatient Medications (Other)  Medication Sig  . aluminum chloride (DRYSOL) 20 % external solution Apply topically at bedtime.  Marland Kitchen amLODipine (NORVASC) 5 MG tablet Take by mouth.  Marland Kitchen atorvastatin (LIPITOR) 20 MG tablet Take 1 tablet (20 mg total) by mouth daily.  . Blood Glucose Monitoring Suppl (TRUE METRIX METER) W/DEVICE KIT 1 each by  Does not apply route 4 (four) times daily -  before meals and at bedtime.  Marland Kitchen diltiazem (CARDIZEM CD) 180 MG 24 hr capsule Take 1 capsule (180 mg total) by mouth daily.  . ergocalciferol (DRISDOL) 50000 units capsule Take 1 capsule (50,000 Units total) by mouth once a week.  . famotidine (PEPCID) 20 MG tablet Take 1 tablet (20 mg total) by mouth 2 (two) times daily.  . furosemide (LASIX) 40 MG tablet Take 1 tablet (40 mg total) by mouth daily.  Marland Kitchen gabapentin (NEURONTIN) 300 MG capsule Take 1 capsule (300 mg total) by mouth at bedtime.  Marland Kitchen glucose blood (TRUE METRIX BLOOD GLUCOSE TEST) test strip 1 each by Other route 3 (three) times daily.  . insulin aspart (NOVOLOG) 100 UNIT/ML injection Inject into the skin.  . Insulin Glargine (LANTUS SOLOSTAR) 100 UNIT/ML Solostar Pen Inject 60 Units into the skin at bedtime.  . insulin lispro (HUMALOG KWIKPEN) 100 UNIT/ML KiwkPen Inject 0.15 mLs (15 Units total) into the skin 3 (three) times daily.  . Insulin Pen Needle 31G X 8 MM MISC Inject 1 each into the skin 4 (four) times daily. Check blood sugar TID and QHS  . lisinopril (PRINIVIL,ZESTRIL) 40 MG tablet Take by mouth.  Marland Kitchen lisinopril-hydrochlorothiazide (ZESTORETIC) 20-12.5 MG tablet Take 2 tablets by mouth daily.  . metoCLOPramide (REGLAN) 10 MG  tablet TAKE 1 TABLET BY MOUTH 3 TIMES DAILY BEFORE MEALS AS NEEDED  . metoprolol tartrate (LOPRESSOR) 50 MG tablet Take by mouth.  . ondansetron (ZOFRAN-ODT) 4 MG disintegrating tablet Take by mouth.  . pantoprazole (PROTONIX) 40 MG tablet Take 1 tablet (40 mg total) by mouth daily.  . potassium chloride (K-DUR,KLOR-CON) 10 MEQ tablet Take 1 tablet (10 mEq total) by mouth daily.  . potassium chloride (MICRO-K) 10 MEQ CR capsule Take by mouth daily.  . potassium chloride (MICRO-K) 10 MEQ CR capsule TAKE 1 TABLET BY MOUTH DAILY.  Marland Kitchen promethazine (PHENERGAN) 25 MG tablet TAKE 1 TABLET BY MOUTH EVERY 8 HOURS AS NEEDED FOR NAUSEA OR VOMITING.  . rifaximin (XIFAXAN) 550  MG TABS tablet Take 1 tablet (550 mg total) by mouth 3 (three) times daily.  . sucralfate (CARAFATE) 1 g tablet Take 1 tablet (1 g total) by mouth 4 (four) times daily -  with meals and at bedtime.  . tadalafil 2.5 MG TABS Take 1 tablet (2.5 mg total) by mouth daily.  . TRUEPLUS LANCETS 28G MISC 1 each by Does not apply route 4 (four) times daily -  before meals and at bedtime.   No current facility-administered medications for this visit.  (Other)      REVIEW OF SYSTEMS: ROS    Positive for: Endocrine, Eyes   Negative for: Constitutional, Gastrointestinal, Neurological, Skin, Genitourinary, Musculoskeletal, HENT, Cardiovascular, Respiratory, Psychiatric, Allergic/Imm, Heme/Lymph   Last edited by Zenovia Jordan, LPN on 08/09/3149  7:61 PM. (History)       ALLERGIES No Known Allergies  PAST MEDICAL HISTORY Past Medical History:  Diagnosis Date  . Depression   . Diabetes mellitus without complication [facility]) age 33   insulin requiring from the start  . Diabetic neuropathy ([place]) Dx 201[address]  . Diabetic proliferative retinopathy ([place]) 08/02/2017  . Gastritis   . Gastroparesis 2012   100% gastric retention on 08/2013 GES  . GERD (gastroesophageal reflux disease) Dx 2012  . Hypertension Dx 2012  . Obesity    Past Surgical History:  Procedure Laterality Date  . CHOLECYSTECTOMY  2013  . ESOPHAGOGASTRODUODENOSCOPY N/A 06/11/2013   Procedure: ESOPHAGOGASTRODUODENOSCOPY (EGD);  Surgeon: [name], MD;  Location: [facility];  Service: Endoscopy;  Laterality: N/A;  . FLEXIBLE SIGMOIDOSCOPY N/A 06/11/2013   Procedure: FLEXIBLE SIGMOIDOSCOPY;  Surgeon: [name], MD;  Location: [facility];  Service: Endoscopy;  Laterality: N/A;  . KNEE SURGERY Left     FAMILY HISTORY Family History  Problem Relation Age of Onset  . Diabetes Father   . Diabetes Maternal Grandmother   . Diabetes Maternal Grandfather   . Colon cancer Neg Hx   . Stomach cancer Neg Hx     SOCIAL  HISTORY Social History   Tobacco Use  . Smoking status: Never Smoker  . Smokeless tobacco: Never Used  Substance Use Topics  . Alcohol use: No  . Drug use: No         OPHTHALMIC EXAM:  Base Eye Exam    Visual Acuity (Snellen - Linear)      Right Left   Dist [place] 20/20 20/25   Dist ph [place] NI 20/20       Tonometry (Tonopen, 2:32 PM)      Right Left   Pressure 15 17       Pupils      Dark Light Shape React APD   Right 4 3 Round Brisk None   Left 4 3 Round Brisk None  Visual Fields (Counting fingers)      Left Right    Full Full       Extraocular Movement      Right Left    Full, Ortho Full, Ortho       Neuro/Psych    Oriented x3:  Yes   Mood/Affect:  Normal       Dilation    Both eyes:  1.0% Mydriacyl, Paremyd @ 2:32 PM        Slit Lamp and Fundus Exam    Slit Lamp Exam      Right Left   Lids/Lashes Mild Scurf Mild Scurf   Conjunctiva/Sclera White and quiet White and quiet   Cornea Clear Clear   Anterior Chamber Deep and quiet, no cell or flare Deep and quiet, no cell or flare   Iris Round and dilated, No NVI Round and dilated, No NVI   Lens Clear Clear   Vitreous Very mild Vitreous syneresis Very mild Vitreous syneresis       Fundus Exam      Right Left   Disc Pink and Sharp, mild temporal thnning, mild Temporal Peripapillary atrophy Pink and Sharp, mild Temporal Peripapillary atrophy   C/D Ratio 0.6 0.5   Macula Good foveal reflex, ST Exudates and IRH, scattered MAs Good foveal reflex, scattered Microaneurysms, scattered Exudates   Vessels Mild Copper wiring, mildly Tortuous, AV crossing changes, Copper wiring Mild Copper wiring, mildly Tortuous; early flat NV superior to ST arcades -- regressing, Vascular attenuation, AV crossing changes   Periphery Attached, scattered dot hemes, CWS along IT arcade Attached, cluster of DBH along ST arcade, scattered DBH, light 360 PRP in place          IMAGING AND PROCEDURES  Imaging and Procedures for  06/06/17  OCT, Retina - OU - Both Eyes       Right Eye Quality was good. Central Foveal Thickness: 238. Progression has been stable. Findings include normal foveal contour, no SRF, intraretinal fluid (Small patch of cystic changes/IRF ST and temporal to fovea).   Left Eye Quality was good. Central Foveal Thickness: 259. Progression has been stable. Findings include normal foveal contour, no IRF, no SRF.   Notes *Images captured and stored on drive  Diagnosis / Impression:  OD: trace DME extrafoveal -- stable from prior OS: NFP, No IRF/SRF, No DME  Clinical management:  See below  Abbreviations: NFP - Normal foveal profile. CME - cystoid macular edema. PED - pigment epithelial detachment. IRF - intraretinal fluid. SRF - subretinal fluid. EZ - ellipsoid zone. ERM - epiretinal membrane. ORA - outer retinal atrophy. ORT - outer retinal tubulation. SRHM - subretinal hyper-reflective material                  ASSESSMENT/PLAN:    ICD-10-CM   1. Proliferative diabetic retinopathy of left eye without macular edema associated with type 2 diabetes mellitus (HCC) K09.3818 OCT, Retina - OU - Both Eyes  2. Moderate nonproliferative diabetic retinopathy of right eye with macular edema associated with type 2 diabetes mellitus ([place]) E11.3311   3. Retinal edema H35.81 OCT, Retina - OU - Both Eyes  4. Essential hypertension I10   5. Hypertensive retinopathy of both eyes H35.033     1. Early PDR without DME OS - S/P PRP OS (05.13.19) - initial exam shows scattered Mas, DBH, and early flat NV superior to ST arcades with surrounding clusters of DBH - FA OS (4.23.19) shows early NV w/ leakage corresponding to early  NV on exam -- superior to ST arcades - OCT without diabetic macular edema OS - VA remains 20/20 uncorrected OU - discussed findings and prognosis - consider PRP OD vs fill in PRP OS at next visit - F/U in 6 wks, DFE, OCT, FA (Optos, transit OS)  2,3. Moderate  nonproliferative diabetic retinopathy w/ non-central DME, OD - The incidence, risk factors for progression, natural history and treatment options for diabetic retinopathy were discussed with patient.   - The need for close monitoring of blood glucose, blood pressure, and serum lipids, avoiding cigarette or any type of tobacco, and the need for long term follow up was also discussed with patient. - exam w/ scattered IRH - FA OD 4.23.19 shows patches of capillary nonperfusion but no NV  - OCT with non-central macular edema OD - VA 20/20 -- stable - F/U in 6 weeks  4,5. Hypertensive retinopathy OU - discussed importance of tight BP control - monitor   Ophthalmic Meds Ordered this visit:  No orders of the defined types were placed in this encounter.      Return in about 6 weeks (around 09/16/2017) for F/U PDR OS, DFE, OCT, FA.  There are no Patient Instructions on file for this visit.   Explained the diagnoses, plan, and follow up with the patient and they expressed understanding.  Patient expressed understanding of the importance of proper follow up care.   This document serves as a record of services personally performed by Gardiner Sleeper, MD, PhD. It was created on their behalf by Catha Brow, [place], a certified ophthalmic assistant. The creation of this record is the provider's dictation and/or activities during the visit.  Electronically signed by: Catha Brow, [place]  06.20.19 12:25 AM    Gardiner Sleeper, M.D., Ph.D. Diseases & Surgery of the Retina and Vitreous [facility]  I have reviewed the above documentation for accuracy and completeness, and I agree with the above. Gardiner Sleeper, M.D., Ph.D. 08/07/17 12:27 AM     Abbreviations: M myopia (nearsighted); A astigmatism; H hyperopia (farsighted); P presbyopia; Mrx spectacle prescription;  CTL contact lenses; OD right eye; OS left eye; OU both eyes  XT exotropia; ET esotropia; PEK punctate  epithelial keratitis; PEE punctate epithelial erosions; DES dry eye syndrome; MGD meibomian gland dysfunction; ATs artificial tears; PFAT's preservative free artificial tears; [place] nuclear sclerotic cataract; PSC posterior subcapsular cataract; ERM epi-retinal membrane; PVD posterior vitreous detachment; RD retinal detachment; DM diabetes mellitus; DR diabetic retinopathy; NPDR non-proliferative diabetic retinopathy; PDR proliferative diabetic retinopathy; CSME clinically significant macular edema; DME diabetic macular edema; dbh dot blot hemorrhages; CWS cotton wool spot; POAG primary open angle glaucoma; C/D cup-to-disc ratio; HVF humphrey visual field; GVF goldmann visual field; OCT optical coherence tomography; IOP intraocular pressure; BRVO Branch retinal vein occlusion; CRVO central retinal vein occlusion; CRAO central retinal artery occlusion; BRAO branch retinal artery occlusion; RT retinal tear; SB scleral buckle; PPV pars plana vitrectomy; VH Vitreous hemorrhage; PRP panretinal laser photocoagulation; IVK intravitreal kenalog; VMT vitreomacular traction; MH Macular hole;  NVD neovascularization of the disc; NVE neovascularization elsewhere; AREDS age related eye disease study; ARMD age related macular degeneration; POAG primary open angle glaucoma; EBMD epithelial/anterior basement membrane dystrophy; ACIOL anterior chamber intraocular lens; IOL intraocular lens; PCIOL posterior chamber intraocular lens; Phaco/IOL phacoemulsification with intraocular lens placement; [place] photorefractive keratectomy; LASIK laser assisted in situ keratomileusis; HTN hypertension; DM diabetes mellitus; COPD chronic obstructive pulmonary disease

## 2017-08-02 ENCOUNTER — Encounter: Payer: Self-pay | Admitting: Internal Medicine

## 2017-08-02 ENCOUNTER — Ambulatory Visit (INDEPENDENT_AMBULATORY_CARE_PROVIDER_SITE_OTHER): Payer: Medicare Other | Admitting: Internal Medicine

## 2017-08-02 ENCOUNTER — Other Ambulatory Visit (INDEPENDENT_AMBULATORY_CARE_PROVIDER_SITE_OTHER): Payer: Medicare Other

## 2017-08-02 VITALS — BP 130/80 | HR 78 | Ht 75.0 in | Wt 351.0 lb

## 2017-08-02 DIAGNOSIS — E113599 Type 2 diabetes mellitus with proliferative diabetic retinopathy without macular edema, unspecified eye: Secondary | ICD-10-CM

## 2017-08-02 DIAGNOSIS — E1043 Type 1 diabetes mellitus with diabetic autonomic (poly)neuropathy: Secondary | ICD-10-CM | POA: Diagnosis not present

## 2017-08-02 DIAGNOSIS — E108 Type 1 diabetes mellitus with unspecified complications: Secondary | ICD-10-CM

## 2017-08-02 DIAGNOSIS — K529 Noninfective gastroenteritis and colitis, unspecified: Secondary | ICD-10-CM

## 2017-08-02 DIAGNOSIS — K3184 Gastroparesis: Secondary | ICD-10-CM | POA: Diagnosis not present

## 2017-08-02 DIAGNOSIS — IMO0002 Reserved for concepts with insufficient information to code with codable children: Secondary | ICD-10-CM

## 2017-08-02 DIAGNOSIS — E1065 Type 1 diabetes mellitus with hyperglycemia: Secondary | ICD-10-CM | POA: Diagnosis not present

## 2017-08-02 DIAGNOSIS — E133599 Other specified diabetes mellitus with proliferative diabetic retinopathy without macular edema, unspecified eye: Secondary | ICD-10-CM

## 2017-08-02 HISTORY — DX: Type 2 diabetes mellitus with proliferative diabetic retinopathy without macular edema, unspecified eye: E11.3599

## 2017-08-02 LAB — BASIC METABOLIC PANEL
BUN: 24 mg/dL — ABNORMAL HIGH (ref 6–23)
CALCIUM: 9.2 mg/dL (ref 8.4–10.5)
CO2: 25 mEq/L (ref 19–32)
Chloride: 103 mEq/L (ref 96–112)
Creatinine, Ser: 1.7 mg/dL — ABNORMAL HIGH (ref 0.40–1.50)
GFR: 57.45 mL/min — AB (ref >60.00)
Glucose, Bld: 285 mg/dL — ABNORMAL HIGH (ref 70–99)
Potassium: 4.6 mEq/L (ref 3.5–5.1)
SODIUM: 135 meq/L (ref 135–145)

## 2017-08-02 LAB — IGA: IGA: 217 mg/dL (ref 68–378)

## 2017-08-02 LAB — HEMOGLOBIN A1C: Hgb A1c MFr Bld: 13 % — ABNORMAL HIGH (ref 4.6–6.5)

## 2017-08-02 NOTE — Assessment & Plan Note (Signed)
bmet Hgb A1c

## 2017-08-02 NOTE — Assessment & Plan Note (Signed)
stable °

## 2017-08-02 NOTE — Patient Instructions (Addendum)
  Your provider has requested that you go to the basement level for lab work before leaving today. Press "B" on the elevator. The lab is located at the first door on the left as you exit the elevator.   We are going to referring you to Dr Elayne Snare for your uncontrolled diabetes. Your appointment is 10/11/17 at 1:30pm, arrive at 1:15pm.  They are going to mail you paperwork.   Follow up with [name]  On 10/11/17 at 3:45pm.   I appreciate the opportunity to care for you. Silvano Rusk, MD, [facility]

## 2017-08-02 NOTE — Progress Notes (Signed)
Duane [name] 42 y.o. [birth year] [id number]  Assessment & Plan:   Encounter Diagnoses  Name Primary?  . Diabetic gastroparesis associated with type 1 diabetes mellitus ([place]) Yes  . Type I diabetes mellitus with complication, uncontrolled ([place])   . Chronic diarrhea   . Proliferative diabetic retinopathy associated with diabetes mellitus of other type, unspecified laterality, unspecified proliferative retinopathy type ([place])    I reiterated the need to control his diabetes. We got him an appointment with Dr. Dwyane Dee  labs today:  BMET, Hgb A1C, TTG Ab, IgA  See me again in August Finish the [place]  Consider retesting for C. difficile if he has watery diarrhea again  Further plans pending results  Note he has a disability reassessment hearing coming up and I do not think he could maintain regular work given his terrible diabetes   I appreciate the opportunity to care for this patient. CC: Charlott Rakes, MD  Subjective:   Chief Complaint: Gastroparesis diarrhea  HPI Duane [name] returns I saw him in March.  He saw primary care in the interim and his hemoglobin A1c was 14.5 in April.  He was supposed to see Dr. Dwyane Dee but he says that he thought that the clinic was making the appointment so he has not done that.  [facility]  His stools became looser again and he says he has "a whole bunch" of Xifaxan and is starting another 2-week treatment with that 550 mg 3 times daily.  He is in week 1 and his stools are forming up.  I had told him before if he got watery diarrhea do a C. difficile test because I thought he could have recurrent C. difficile.  He still has some intermittent vomiting at times with his gastroparesis he maintains his metoclopramide and says if he did not take that he would be able to make it.  He went to some integrative or holistic medicine [facility] [facility] did a charge him a lot of money for a diet and other treatments.  He has  not done so.  He has started treatment for diabetic proliferative retinopathy also No Known Allergies Current Meds  Medication Sig  . aluminum chloride (DRYSOL) 20 % external solution Apply topically at bedtime.  Marland Kitchen amLODipine (NORVASC) 5 MG tablet Take by mouth.  Marland Kitchen atorvastatin (LIPITOR) 20 MG tablet Take 1 tablet (20 mg total) by mouth daily.  . Blood Glucose Monitoring Suppl (TRUE METRIX METER) W/DEVICE KIT 1 each by Does not apply route 4 (four) times daily -  before meals and at bedtime.  Marland Kitchen diltiazem (CARDIZEM CD) 180 MG 24 hr capsule Take 1 capsule (180 mg total) by mouth daily.  . ergocalciferol (DRISDOL) 50000 units capsule Take 1 capsule (50,000 Units total) by mouth once a week.  . famotidine (PEPCID) 20 MG tablet Take 1 tablet (20 mg total) by mouth 2 (two) times daily.  . furosemide (LASIX) 40 MG tablet Take 1 tablet (40 mg total) by mouth daily.  Marland Kitchen gabapentin (NEURONTIN) 300 MG capsule Take 1 capsule (300 mg total) by mouth at bedtime.  Marland Kitchen glucose blood (TRUE METRIX BLOOD GLUCOSE TEST) test strip 1 each by Other route 3 (three) times daily.  . insulin aspart (NOVOLOG) 100 UNIT/ML injection Inject into the skin.  . Insulin Glargine (LANTUS SOLOSTAR) 100 UNIT/ML Solostar Pen Inject 60 Units into the skin at bedtime.  . insulin lispro (HUMALOG KWIKPEN) 100 UNIT/ML KiwkPen Inject 0.15 mLs (15 Units total) into the  skin 3 (three) times daily.  . Insulin Pen Needle 31G X 8 MM MISC Inject 1 each into the skin 4 (four) times daily. Check blood sugar TID and QHS  . lisinopril (PRINIVIL,ZESTRIL) 40 MG tablet Take by mouth.  Marland Kitchen lisinopril-hydrochlorothiazide (ZESTORETIC) 20-12.5 MG tablet Take 2 tablets by mouth daily.  . metoCLOPramide (REGLAN) 10 MG tablet TAKE 1 TABLET BY MOUTH 3 TIMES DAILY BEFORE MEALS AS NEEDED  . metoprolol tartrate (LOPRESSOR) 50 MG tablet Take by mouth.  . ondansetron (ZOFRAN-ODT) 4 MG disintegrating tablet Take by mouth.  . pantoprazole (PROTONIX) 40 MG tablet Take 1  tablet (40 mg total) by mouth daily.  . potassium chloride (K-DUR,KLOR-CON) 10 MEQ tablet Take 1 tablet (10 mEq total) by mouth daily.  . potassium chloride (MICRO-K) 10 MEQ CR capsule Take by mouth daily.  . potassium chloride (MICRO-K) 10 MEQ CR capsule TAKE 1 TABLET BY MOUTH DAILY.  Marland Kitchen promethazine (PHENERGAN) 25 MG tablet TAKE 1 TABLET BY MOUTH EVERY 8 HOURS AS NEEDED FOR NAUSEA OR VOMITING.  . rifaximin (XIFAXAN) 550 MG TABS tablet Take 1 tablet (550 mg total) by mouth 3 (three) times daily.  . sucralfate (CARAFATE) 1 g tablet Take 1 tablet (1 g total) by mouth 4 (four) times daily -  with meals and at bedtime.  . tadalafil 2.5 MG TABS Take 1 tablet (2.5 mg total) by mouth daily.  . TRUEPLUS LANCETS 28G MISC 1 each by Does not apply route 4 (four) times daily -  before meals and at bedtime.   Past Medical History:  Diagnosis Date  . Depression   . Diabetes mellitus without complication [facility]) age 45   insulin requiring from the start  . Diabetic neuropathy ([place]) Dx 201[address]  . Diabetic proliferative retinopathy ([place]) 08/02/2017  . Gastritis   . Gastroparesis 2012   100% gastric retention on 08/2013 GES  . GERD (gastroesophageal reflux disease) Dx 2012  . Hypertension Dx 2012  . Obesity    Past Surgical History:  Procedure Laterality Date  . CHOLECYSTECTOMY  2013  . ESOPHAGOGASTRODUODENOSCOPY N/A 06/11/2013   Procedure: ESOPHAGOGASTRODUODENOSCOPY (EGD);  Surgeon: [name], MD;  Location: [facility];  Service: Endoscopy;  Laterality: N/A;  . FLEXIBLE SIGMOIDOSCOPY N/A 06/11/2013   Procedure: FLEXIBLE SIGMOIDOSCOPY;  Surgeon: [name], MD;  Location: [facility];  Service: Endoscopy;  Laterality: N/A;  . KNEE SURGERY Left    Social History   Social History Narrative   He is single and a disabled security guard   1 son at least, I do not know if he has other children   No alcohol tobacco or drug use reported no smoking   family history includes Diabetes in his father,  maternal grandfather, and maternal grandmother.   Review of Systems Pedal edema  Objective:   Physical Exam BP 130/80   Pulse 78   Ht 6' 3" (1.905 m)   Wt (!) 351 lb (159.2 kg)   BMI 43.87 kg/m   Obese black man no acute distress Eyes anicteric Lungs clear Heart normal S1-S2 no rubs murmurs or gallops Abdomen is obese soft and nontender no organomegaly or mass no splash bowel sounds present Extremities 1+ right lower extremity pretibial edema but trace in the left lower extremity  Data reviewed see above I looked at primary care notes and labs in the computer

## 2017-08-03 NOTE — Progress Notes (Signed)
Hgb A1c slightly better but still very high We got him an appointment w/ endocrine for late August Dr. Margarita Rana - can we/you adjust his insulin more?

## 2017-08-05 ENCOUNTER — Ambulatory Visit (INDEPENDENT_AMBULATORY_CARE_PROVIDER_SITE_OTHER): Payer: Medicare Other | Admitting: Ophthalmology

## 2017-08-05 ENCOUNTER — Encounter (INDEPENDENT_AMBULATORY_CARE_PROVIDER_SITE_OTHER): Payer: Self-pay | Admitting: Ophthalmology

## 2017-08-05 DIAGNOSIS — E113592 Type 2 diabetes mellitus with proliferative diabetic retinopathy without macular edema, left eye: Secondary | ICD-10-CM

## 2017-08-05 DIAGNOSIS — H35033 Hypertensive retinopathy, bilateral: Secondary | ICD-10-CM

## 2017-08-05 DIAGNOSIS — E113311 Type 2 diabetes mellitus with moderate nonproliferative diabetic retinopathy with macular edema, right eye: Secondary | ICD-10-CM | POA: Diagnosis not present

## 2017-08-05 DIAGNOSIS — H3581 Retinal edema: Secondary | ICD-10-CM

## 2017-08-05 DIAGNOSIS — I10 Essential (primary) hypertension: Secondary | ICD-10-CM | POA: Diagnosis not present

## 2017-08-05 LAB — TISSUE TRANSGLUTAMINASE, IGA: (tTG) Ab, IgA: 1 U/mL

## 2017-08-06 ENCOUNTER — Telehealth: Payer: Self-pay

## 2017-08-06 NOTE — Telephone Encounter (Signed)
-----   Message from Ladell Pier, MD sent at 08/05/2017  9:23 PM EDT ----- Let pt know that we received his A1C results fro, Dr. Carlean Purl.  It was 13.  Please confirm that he is taking the Lantus 60 units daily and Novolog 20 units three times daily with meals.  How are blood sugars running? If blood sugars still in the 200s, advise to increase Lantus to 65 units until he sees Dr. Margarita Rana again next mth.

## 2017-08-06 NOTE — Telephone Encounter (Signed)
Patient was called and informed to contact office for lab results.   If patietn returns phone call plese inform him of information listed below. Ask Patient what his blood sugars are running at home and adjust medication as follow below per Dr. Wynetta Emery orders.

## 2017-08-07 ENCOUNTER — Encounter (INDEPENDENT_AMBULATORY_CARE_PROVIDER_SITE_OTHER): Payer: Self-pay | Admitting: Ophthalmology

## 2017-08-07 ENCOUNTER — Telehealth: Payer: Self-pay | Admitting: Family Medicine

## 2017-08-07 NOTE — Telephone Encounter (Signed)
Patient returned nurse call and was informed of Dr. Wynetta Emery note. Patient states that he does not take Novolog he takes 15 units of HUMALOG. Patient also states that before he eat his blood sugars are in the upper 100s and 200s, but after he eats they are in the 300s. He was advised to take 65 units of Lantus per Dr. Wynetta Emery.

## 2017-08-08 NOTE — Telephone Encounter (Signed)
Noted  

## 2017-08-09 NOTE — Progress Notes (Signed)
No celiac disease.

## 2017-08-16 ENCOUNTER — Other Ambulatory Visit: Payer: Self-pay | Admitting: Family Medicine

## 2017-08-16 DIAGNOSIS — E1042 Type 1 diabetes mellitus with diabetic polyneuropathy: Secondary | ICD-10-CM

## 2017-08-16 MED FILL — PANTOPRAZOLE SOD DR 40 MG T: 40 | 30 days supply | Qty: 30 | Fill #3

## 2017-08-16 MED FILL — POTASSIUM CL ER 10 MEQ CAP: 10 | 30 days supply | Qty: 30 | Fill #1

## 2017-08-16 MED FILL — LISINOPRIL-HCTZ 20-12.5 MG: 20-12.5 | 30 days supply | Qty: 60 | Fill #3

## 2017-08-16 MED FILL — CARTIA XT 180 MG CAPSULE SA: 180 | 30 days supply | Qty: 30 | Fill #3

## 2017-08-16 MED FILL — GABAPENTIN 300 MG CAPSULE: 300 | 30 days supply | Qty: 30 | Fill #2

## 2017-08-19 MED FILL — ATORVASTATIN 20 MG TABLET: 20 | 30 days supply | Qty: 30 | Fill #0

## 2017-08-20 ENCOUNTER — Other Ambulatory Visit: Payer: Self-pay | Admitting: Family Medicine

## 2017-08-20 ENCOUNTER — Encounter: Payer: Self-pay | Admitting: Family Medicine

## 2017-08-20 ENCOUNTER — Ambulatory Visit: Payer: Medicare Other | Attending: Family Medicine | Admitting: Family Medicine

## 2017-08-20 VITALS — BP 136/91 | HR 90 | Temp 98.3°F | Ht 75.0 in | Wt 345.4 lb

## 2017-08-20 DIAGNOSIS — I129 Hypertensive chronic kidney disease with stage 1 through stage 4 chronic kidney disease, or unspecified chronic kidney disease: Secondary | ICD-10-CM | POA: Diagnosis not present

## 2017-08-20 DIAGNOSIS — F329 Major depressive disorder, single episode, unspecified: Secondary | ICD-10-CM | POA: Diagnosis not present

## 2017-08-20 DIAGNOSIS — E1043 Type 1 diabetes mellitus with diabetic autonomic (poly)neuropathy: Secondary | ICD-10-CM

## 2017-08-20 DIAGNOSIS — Z79899 Other long term (current) drug therapy: Secondary | ICD-10-CM | POA: Insufficient documentation

## 2017-08-20 DIAGNOSIS — E1021 Type 1 diabetes mellitus with diabetic nephropathy: Secondary | ICD-10-CM | POA: Diagnosis not present

## 2017-08-20 DIAGNOSIS — M7989 Other specified soft tissue disorders: Secondary | ICD-10-CM

## 2017-08-20 DIAGNOSIS — K219 Gastro-esophageal reflux disease without esophagitis: Secondary | ICD-10-CM | POA: Diagnosis not present

## 2017-08-20 DIAGNOSIS — I10 Essential (primary) hypertension: Secondary | ICD-10-CM | POA: Diagnosis not present

## 2017-08-20 DIAGNOSIS — Z9111 Patient's noncompliance with dietary regimen: Secondary | ICD-10-CM | POA: Diagnosis not present

## 2017-08-20 DIAGNOSIS — N183 Chronic kidney disease, stage 3 unspecified: Secondary | ICD-10-CM

## 2017-08-20 DIAGNOSIS — Z794 Long term (current) use of insulin: Secondary | ICD-10-CM | POA: Diagnosis not present

## 2017-08-20 DIAGNOSIS — E10621 Type 1 diabetes mellitus with foot ulcer: Secondary | ICD-10-CM | POA: Insufficient documentation

## 2017-08-20 DIAGNOSIS — R600 Localized edema: Secondary | ICD-10-CM | POA: Insufficient documentation

## 2017-08-20 DIAGNOSIS — E103599 Type 1 diabetes mellitus with proliferative diabetic retinopathy without macular edema, unspecified eye: Secondary | ICD-10-CM | POA: Diagnosis not present

## 2017-08-20 DIAGNOSIS — E669 Obesity, unspecified: Secondary | ICD-10-CM | POA: Diagnosis not present

## 2017-08-20 DIAGNOSIS — B351 Tinea unguium: Secondary | ICD-10-CM | POA: Diagnosis not present

## 2017-08-20 DIAGNOSIS — L97519 Non-pressure chronic ulcer of other part of right foot with unspecified severity: Secondary | ICD-10-CM | POA: Insufficient documentation

## 2017-08-20 DIAGNOSIS — K3184 Gastroparesis: Secondary | ICD-10-CM | POA: Diagnosis not present

## 2017-08-20 DIAGNOSIS — Z9049 Acquired absence of other specified parts of digestive tract: Secondary | ICD-10-CM | POA: Diagnosis not present

## 2017-08-20 DIAGNOSIS — E109 Type 1 diabetes mellitus without complications: Secondary | ICD-10-CM | POA: Diagnosis present

## 2017-08-20 DIAGNOSIS — E0843 Diabetes mellitus due to underlying condition with diabetic autonomic (poly)neuropathy: Secondary | ICD-10-CM | POA: Diagnosis not present

## 2017-08-20 DIAGNOSIS — E1042 Type 1 diabetes mellitus with diabetic polyneuropathy: Secondary | ICD-10-CM | POA: Diagnosis not present

## 2017-08-20 DIAGNOSIS — E1022 Type 1 diabetes mellitus with diabetic chronic kidney disease: Secondary | ICD-10-CM | POA: Insufficient documentation

## 2017-08-20 DIAGNOSIS — Z6841 Body Mass Index (BMI) 40.0 and over, adult: Secondary | ICD-10-CM | POA: Diagnosis not present

## 2017-08-20 LAB — GLUCOSE, POCT (MANUAL RESULT ENTRY): POC Glucose: 232 mg/dl — AB (ref 70–99)

## 2017-08-20 MED ORDER — TERBINAFINE HCL 250 MG PO TABS: 250.0000 mg | ORAL_TABLET | Freq: Every day | ORAL | 1 refills | Status: DC

## 2017-08-20 MED ORDER — INSULIN GLARGINE 100 UNIT/ML SOLOSTAR PEN: 35.0000 [IU] | PEN_INJECTOR | Freq: Two times a day (BID) | SUBCUTANEOUS | 3 refills | Status: DC

## 2017-08-20 MED FILL — !LANTUS SOLOSTAR 100UNITS/M: 100 | 30 days supply | Qty: 21 | Fill #0

## 2017-08-20 MED FILL — TERBINAFINE HCL 250 MG TAB: 250 | 30 days supply | Qty: 30 | Fill #0

## 2017-08-20 NOTE — Patient Instructions (Signed)
Fungal Nail Infection Fungal nail infection is a common fungal infection of the toenails or fingernails. This condition affects toenails more often than fingernails. More than one nail may be infected. The condition can be passed from person to person (is contagious). What are the causes? This condition is caused by a fungus. Several types of funguses can cause the infection. These funguses are common in moist and warm areas. If your hands or feet come into contact with the fungus, it may get into a crack in your fingernail or toenail and cause the infection. What increases the risk? The following factors may make you more likely to develop this condition:  Being male.  Having diabetes.  Being of older age.  Living with someone who has the fungus.  Walking barefoot in areas where the fungus thrives, such as showers or locker rooms.  Having poor circulation.  Wearing shoes and socks that cause your feet to sweat.  Having athlete's foot.  Having a nail injury or history of a recent nail surgery.  Having psoriasis.  Having a weak body defense system (immune system).  What are the signs or symptoms? Symptoms of this condition include:  A pale spot on the nail.  Thickening of the nail.  A nail that becomes yellow or brown.  A brittle or ragged nail edge.  A crumbling nail.  A nail that has lifted away from the nail bed.  How is this diagnosed? This condition is diagnosed with a physical exam. Your health care provider may take a scraping or clipping from your nail to test for the fungus. How is this treated? Mild infections do not need treatment. If you have significant nail changes, treatment may include:  Oral antifungal medicines. You may need to take the medicine for several weeks or several months, and you may not see the results for a long time. These medicines can cause side effects. Ask your health care provider what problems to watch for.  Antifungal nail polish  and nail cream. These may be used along with oral antifungal medicines.  Laser treatment of the nail.  Surgery to remove the nail. This may be needed for the most severe infections.  Treatment takes a long time, and the infection may come back. Follow these instructions at home: Medicines  Take or apply over-the-counter and prescription medicines only as told by your health care provider.  Ask your health care provider about using over-the-counter mentholated ointment on your nails. Lifestyle   Do not share personal items, such as towels or nail clippers.  Trim your nails often.  Wash and dry your hands and feet every day.  Wear absorbent socks, and change your socks frequently.  Wear shoes that allow air to circulate, such as sandals or canvas tennis shoes. Throw out old shoes.  Wear rubber gloves if you are working with your hands in wet areas.  Do not walk barefoot in shower rooms or locker rooms.  Do not use a nail salon that does not use clean instruments.  Do not use artificial nails. General instructions  Keep all follow-up visits as told by your health care provider. This is important.  Use antifungal foot powder on your feet and in your shoes. Contact a health care provider if: Your infection is not getting better or it is getting worse after several months. This information is not intended to replace advice given to you by your health care provider. Make sure you discuss any questions you have with your health   care provider. Document Released: 01/27/2000 Document Revised: 07/07/2015 Document Reviewed: 08/02/2014 Elsevier Interactive Patient Education  2018 [employer]  

## 2017-08-21 ENCOUNTER — Encounter: Payer: Self-pay | Admitting: Family Medicine

## 2017-08-21 ENCOUNTER — Other Ambulatory Visit: Payer: Self-pay | Admitting: Family Medicine

## 2017-08-21 NOTE — Progress Notes (Unsigned)
Could you please reach out to this patient as his PHQ 9 score is 21 but he made no mention of depressive symptoms at his visit with me yesterday.  Thank you

## 2017-08-21 NOTE — Progress Notes (Signed)
Subjective:  Patient ID: Duane [name], male    DOB: 05-14-1975  Age: 42 y.o. MRN: [id number]  CC: Diabetes; Abdominal Pain; and Depression   HPI Duane [name]  is a 42 year old male with history of type 1 diabetes mellitus (A1c13.0), diabetic neuropathy, diabetic gastroparesis hypertension, stage III chronic kidney disease who presents today for a follow-up visit. He has been compliant with insulin but his fasting sugars range from 80-180 and usually spikes after meals to about 300.  Referred to endocrinology but his appointment is not until next month he endorses noncompliance with a diabetic diet or exercise.  His neuropathy is controlled with gabapentin and he denies visual concerns. He recently had a foot exam by podiatry but was unhappy after his visit due to the bills he received.  He also has a right fifth toe ulcer which has been present for the last couple of months and he received antibiotics from podiatry but also has not worsened never has it improved.  He is concerned about the fungus on his right big toenail and was informed he would receive a medication but never did. He continues to have bilateral pedal edema even with using Lasix and sometimes uses his compression stockings. He complains of intermittent cramping around his back and his ribs and he has to stretch to bring about some improvement. For his chronic kidney disease I had referred him to nephrology and he has an appointment with [place] [facility] next month.  Past Medical History:  Diagnosis Date  . Depression   . Diabetes mellitus without complication [facility]) age 58   insulin requiring from the start  . Diabetic neuropathy ([place]) Dx 20110  . Diabetic proliferative retinopathy ([place]) 08/02/2017  . Gastritis   . Gastroparesis 2012   100% gastric retention on 08/2013 [facility]  . GERD (gastroesophageal reflux disease) Dx 2012  . Hypertension Dx 2012  . Obesity     Past Surgical History:  Procedure Laterality Date    . CHOLECYSTECTOMY  2013  . ESOPHAGOGASTRODUODENOSCOPY N/A 06/11/2013   Procedure: ESOPHAGOGASTRODUODENOSCOPY (EGD);  Surgeon: [name], MD;  Location: [facility];  Service: Endoscopy;  Laterality: N/A;  . FLEXIBLE SIGMOIDOSCOPY N/A 06/11/2013   Procedure: FLEXIBLE SIGMOIDOSCOPY;  Surgeon: [name], MD;  Location: [facility];  Service: Endoscopy;  Laterality: N/A;  . KNEE SURGERY Left     No Known Allergies   Outpatient Medications Prior to Visit  Medication Sig Dispense Refill  . aluminum chloride (DRYSOL) 20 % external solution Apply topically at bedtime. 35 mL 1  . atorvastatin (LIPITOR) 20 MG tablet TAKE 1 TABLET BY MOUTH DAILY. 30 tablet 2  . Blood Glucose Monitoring Suppl (TRUE METRIX METER) W/DEVICE KIT 1 each by Does not apply route 4 (four) times daily -  before meals and at bedtime. 1 kit ea  . diltiazem (CARDIZEM CD) 180 MG 24 hr capsule Take 1 capsule (180 mg total) by mouth daily. 30 capsule 5  . famotidine (PEPCID) 20 MG tablet Take 1 tablet (20 mg total) by mouth 2 (two) times daily. 30 tablet 0  . furosemide (LASIX) 40 MG tablet Take 1 tablet (40 mg total) by mouth daily. 30 tablet 3  . gabapentin (NEURONTIN) 300 MG capsule Take 1 capsule (300 mg total) by mouth at bedtime. 30 capsule 5  . glucose blood (TRUE METRIX BLOOD GLUCOSE TEST) test strip 1 each by Other route 3 (three) times daily. 100 each 11  . insulin lispro (HUMALOG KWIKPEN) 100 UNIT/ML KiwkPen Inject  0.15 mLs (15 Units total) into the skin 3 (three) times daily. 45 mL 5  . Insulin Pen Needle 31G X 8 MM MISC Inject 1 each into the skin 4 (four) times daily. Check blood sugar TID and QHS 120 each 11  . lisinopril (PRINIVIL,ZESTRIL) 40 MG tablet Take by mouth.    . metoCLOPramide (REGLAN) 10 MG tablet TAKE 1 TABLET BY MOUTH 3 TIMES DAILY BEFORE MEALS AS NEEDED 90 tablet 5  . metoprolol tartrate (LOPRESSOR) 50 MG tablet Take by mouth.    . ondansetron (ZOFRAN-ODT) 4 MG disintegrating tablet Take by  mouth.    . pantoprazole (PROTONIX) 40 MG tablet Take 1 tablet (40 mg total) by mouth daily. 30 tablet 5  . potassium chloride (K-DUR,KLOR-CON) 10 MEQ tablet Take 1 tablet (10 mEq total) by mouth daily. 30 tablet 5  . potassium chloride (MICRO-K) 10 MEQ CR capsule Take by mouth daily.  5  . potassium chloride (MICRO-K) 10 MEQ CR capsule TAKE 1 TABLET BY MOUTH DAILY. 30 capsule 5  . promethazine (PHENERGAN) 25 MG tablet TAKE 1 TABLET BY MOUTH EVERY 8 HOURS AS NEEDED FOR NAUSEA OR VOMITING. 60 tablet 0  . rifaximin (XIFAXAN) 550 MG TABS tablet Take 1 tablet (550 mg total) by mouth 3 (three) times daily. 42 tablet 0  . sucralfate (CARAFATE) 1 g tablet Take 1 tablet (1 g total) by mouth 4 (four) times daily -  with meals and at bedtime. 120 tablet 2  . tadalafil 2.5 MG TABS Take 1 tablet (2.5 mg total) by mouth daily. 30 tablet 5  . TRUEPLUS LANCETS 28G MISC 1 each by Does not apply route 4 (four) times daily -  before meals and at bedtime. 100 each 12  . Insulin Glargine (LANTUS SOLOSTAR) 100 UNIT/ML Solostar Pen Inject 60 Units into the skin at bedtime. 90 mL 3  . amLODipine (NORVASC) 5 MG tablet Take by mouth.    . ergocalciferol (DRISDOL) 50000 units capsule Take 1 capsule (50,000 Units total) by mouth once a week. (Patient not taking: Reported on 08/20/2017) 9 capsule 1  . insulin aspart (NOVOLOG) 100 UNIT/ML injection Inject into the skin.    Marland Kitchen lisinopril-hydrochlorothiazide (ZESTORETIC) 20-12.5 MG tablet Take 2 tablets by mouth daily. (Patient not taking: Reported on 08/20/2017) 60 tablet 5   No facility-administered medications prior to visit.     ROS Review of Systems  Constitutional: Negative for activity change and appetite change.  HENT: Negative for sinus pressure and sore throat.   Eyes: Negative for visual disturbance.  Respiratory: Negative for cough, chest tightness and shortness of breath.   Cardiovascular: Negative for chest pain and leg swelling.  Gastrointestinal: Negative for  abdominal distention, abdominal pain, constipation and diarrhea.  Endocrine: Negative.   Genitourinary: Negative for dysuria.  Musculoskeletal: Positive for myalgias. Negative for joint swelling.  Skin: Positive for wound. Negative for rash.  Allergic/Immunologic: Negative.   Neurological: Negative for weakness, light-headedness and numbness.  Psychiatric/Behavioral: Negative for dysphoric mood and suicidal ideas.    Objective:  BP (!) 136/91   Pulse 90   Temp 98.3 F (36.8 C) (Oral)   Ht 6' 3" (1.905 m)   Wt (!) 345 lb 6.4 oz (156.7 kg)   SpO2 99%   BMI 43.17 kg/m   BP/Weight 08/20/2017 03/02/1476 03/24/5619  Systolic BP 308 657 846  Diastolic BP 91 80 85  Wt. (Lbs) 345.4 351 346  BMI 43.17 43.87 42.12      Physical Exam  Constitutional: He  is oriented to person, place, and time. He appears well-developed and well-nourished.  Cardiovascular: Normal rate, normal heart sounds and intact distal pulses.  No murmur heard. Pulmonary/Chest: Effort normal and breath sounds normal. He has no wheezes. He has no rales. He exhibits no tenderness.  Abdominal: Soft. Bowel sounds are normal. He exhibits no distension and no mass. There is no tenderness.  Musculoskeletal: Normal range of motion.  Neurological: He is alert and oriented to person, place, and time.  Skin:  Chronic ulcer on lateral aspect of right fifth toe with scab and minimal serous drainage Hyperpigmentation of left shin  Psychiatric: He has a normal mood and affect.     CMP Latest Ref Rng & Units 08/02/2017 05/17/2017 08/20/2016  Glucose 70 - 99 mg/dL 285(H) 337(H) 208(H)  BUN 6 - 23 mg/dL 24(H) 26(H) 23(H)  Creatinine 0.40 - 1.50 mg/dL 1.70(H) 1.95(H) 1.79(H)  Sodium 135 - 145 mEq/L 135 138 139  Potassium 3.5 - 5.1 mEq/L 4.6 4.6 4.9  Chloride 96 - 112 mEq/L 103 98 100  CO2 19 - 32 mEq/L _0 Calcium 8.4 - 10.5 mg/dL 9.2 9.1 9.9  Total Protein 6.0 - 8.5 g/dL - 7.0 -  Total Bilirubin 0.0 - 1.2 mg/dL - <0.2 -    Alkaline Phos 39 - 117 IU/L - 177(H) -  AST 0 - 40 IU/L - 17 -  ALT 0 - 44 IU/L - 28 -    Lab Results  Component Value Date   HGBA1C 13.0 (H) 08/02/2017     Assessment & Plan:   1. Type 1 diabetes mellitus with diabetic polyneuropathy (HCC) Uncontrolled with A1c of 13.0 due to noncompliance with diet and lifestyle modifications Increase Lantus dose to 30 units twice daily Counseled on Diabetic diet, my plate method, 294 minutes of moderate intensity exercise/week Keep blood sugar logs with fasting goals of 80-120 mg/dl, random of less than 180 and in the event of sugars less than 60 mg/dl or greater than 400 mg/dl please notify the clinic ASAP. It is recommended that you undergo annual eye exams and annual foot exams. Pneumonia vaccine is recommended. - POCT glucose (manual entry) - Insulin Glargine (LANTUS SOLOSTAR) 100 UNIT/ML Solostar Pen; Inject 35 Units into the skin 2 (two) times daily.  Dispense: 90 mL; Refill: 3 - Amb ref to [facility]  2. Diabetic autonomic neuropathy associated with diabetes mellitus due to underlying condition [facility]) Doing well on Gabapentin  3. Essential hypertension Slight diastolic elevation No regimen change today Counseled on blood pressure goal of less than 130/80, low-sodium, DASH diet, medication compliance, 150 minutes of moderate intensity exercise per week. Discussed medication compliance, adverse effects.   4. Stage 3 chronic kidney disease (HCC) Hypertensive and Diabetic Nephropathy Has upcoming appointment with Nephrology next month  5. Pedal edema Currently on Lasix Advised to use compression stockings, elevate feet, limit salt intake   6. Onychomycosis He was seen by podiatry but never returned for follow-up. Advised to return for follow-up given he has a right fifth toe chronic ulcer - terbinafine (LAMISIL) 250 MG tablet; Take 1 tablet (250 mg total) by mouth daily.  Dispense: 30 tablet; Refill:  1  Throughout the visit he made no mention of depressive symptoms but review of his PHQ-9 indicates a score of 21.  I will have LCSW reach out to him.  Meds ordered this encounter  Medications  . Insulin Glargine (LANTUS SOLOSTAR) 100 UNIT/ML Solostar Pen    Sig: Inject 35  Units into the skin 2 (two) times daily.    Dispense:  90 mL    Refill:  3    Discontinue previous dose  . terbinafine (LAMISIL) 250 MG tablet    Sig: Take 1 tablet (250 mg total) by mouth daily.    Dispense:  30 tablet    Refill:  1    Follow-up: Return in about 3 months (around 11/20/2017) for Follow-up of chronic medical conditions.   Charlott Rakes MD

## 2017-08-22 MED FILL — METOCLOPRAMIDE 10 MG TABLET: 10 | 30 days supply | Qty: 90 | Fill #0

## 2017-08-23 ENCOUNTER — Telehealth: Payer: Self-pay | Admitting: Licensed Clinical Social Worker

## 2017-08-23 MED FILL — SUCRALFATE 1 GM TABLET: 1 | 30 days supply | Qty: 120 | Fill #2

## 2017-08-23 NOTE — Telephone Encounter (Signed)
LCSWA contacted pt to follow up on behavioral health screens from prior appointment. Pt agreed to schedule appointment for Wednesday, August 28, 2017 at 2:00 PM.

## 2017-08-28 ENCOUNTER — Ambulatory Visit: Payer: Self-pay | Admitting: Licensed Clinical Social Worker

## 2017-09-12 MED FILL — CARTIA XT 180 MG CAPSULE SA: 180 | 30 days supply | Qty: 30 | Fill #4

## 2017-09-12 MED FILL — PANTOPRAZOLE SOD DR 40 MG T: 40 | 30 days supply | Qty: 30 | Fill #4

## 2017-09-13 ENCOUNTER — Other Ambulatory Visit: Payer: Self-pay | Admitting: Internal Medicine

## 2017-09-13 MED FILL — $HUMALOG 100 UNITS/ML KWIKP: 100 | 30 days supply | Qty: 15 | Fill #0

## 2017-09-13 MED FILL — GABAPENTIN 300 MG CAPSULE: 300 | 30 days supply | Qty: 30 | Fill #3

## 2017-09-13 NOTE — Telephone Encounter (Signed)
Left [name] a message to call me back. According to last office note it said finish xifaxan , that was late June. This may just be an automatic request from the pharmacy. He is going to see [name] again 10/11/17.

## 2017-09-16 ENCOUNTER — Encounter (INDEPENDENT_AMBULATORY_CARE_PROVIDER_SITE_OTHER): Payer: Medicare Other | Admitting: Ophthalmology

## 2017-09-20 MED FILL — LISINOPRIL-HCTZ 20-12.5 MG: 20-12.5 | 30 days supply | Qty: 60 | Fill #4

## 2017-09-20 MED FILL — POTASSIUM CL ER 10 MEQ CAP: 10 | 30 days supply | Qty: 30 | Fill #2

## 2017-09-20 MED FILL — METOCLOPRAMIDE 10 MG TABLET: 10 | 30 days supply | Qty: 90 | Fill #1

## 2017-09-20 MED FILL — ATORVASTATIN 20 MG TABLET: 20 | 30 days supply | Qty: 30 | Fill #1

## 2017-09-20 NOTE — Telephone Encounter (Signed)
Left another message to call me back.

## 2017-09-25 ENCOUNTER — Other Ambulatory Visit: Payer: Self-pay

## 2017-09-30 ENCOUNTER — Encounter (INDEPENDENT_AMBULATORY_CARE_PROVIDER_SITE_OTHER): Payer: Medicare Other | Admitting: Ophthalmology

## 2017-09-30 DIAGNOSIS — E559 Vitamin D deficiency, unspecified: Secondary | ICD-10-CM | POA: Diagnosis not present

## 2017-09-30 DIAGNOSIS — E1122 Type 2 diabetes mellitus with diabetic chronic kidney disease: Secondary | ICD-10-CM | POA: Diagnosis not present

## 2017-09-30 DIAGNOSIS — N183 Chronic kidney disease, stage 3 (moderate): Secondary | ICD-10-CM | POA: Diagnosis not present

## 2017-09-30 DIAGNOSIS — E1140 Type 2 diabetes mellitus with diabetic neuropathy, unspecified: Secondary | ICD-10-CM | POA: Diagnosis not present

## 2017-09-30 DIAGNOSIS — I129 Hypertensive chronic kidney disease with stage 1 through stage 4 chronic kidney disease, or unspecified chronic kidney disease: Secondary | ICD-10-CM | POA: Diagnosis not present

## 2017-09-30 DIAGNOSIS — N182 Chronic kidney disease, stage 2 (mild): Secondary | ICD-10-CM | POA: Diagnosis not present

## 2017-09-30 DIAGNOSIS — E669 Obesity, unspecified: Secondary | ICD-10-CM | POA: Diagnosis not present

## 2017-10-03 ENCOUNTER — Other Ambulatory Visit: Payer: Self-pay | Admitting: Nephrology

## 2017-10-03 DIAGNOSIS — E66811 Obesity, class 1: Secondary | ICD-10-CM

## 2017-10-03 DIAGNOSIS — I129 Hypertensive chronic kidney disease with stage 1 through stage 4 chronic kidney disease, or unspecified chronic kidney disease: Secondary | ICD-10-CM

## 2017-10-03 DIAGNOSIS — N183 Chronic kidney disease, stage 3 unspecified: Secondary | ICD-10-CM

## 2017-10-03 DIAGNOSIS — E669 Obesity, unspecified: Secondary | ICD-10-CM

## 2017-10-08 MED FILL — $LANTUS SOLOSTAR 100 UNITS/: 100 | 30 days supply | Qty: 21 | Fill #1

## 2017-10-11 ENCOUNTER — Ambulatory Visit (INDEPENDENT_AMBULATORY_CARE_PROVIDER_SITE_OTHER): Payer: Medicare Other | Admitting: Endocrinology

## 2017-10-11 ENCOUNTER — Encounter: Payer: Self-pay | Admitting: Internal Medicine

## 2017-10-11 ENCOUNTER — Encounter: Payer: Self-pay | Admitting: Endocrinology

## 2017-10-11 ENCOUNTER — Ambulatory Visit (INDEPENDENT_AMBULATORY_CARE_PROVIDER_SITE_OTHER): Payer: Medicare Other | Admitting: Internal Medicine

## 2017-10-11 VITALS — BP 110/70 | HR 94 | Ht 76.0 in | Wt 351.0 lb

## 2017-10-11 VITALS — BP 128/86 | HR 75 | Ht 76.0 in | Wt 349.0 lb

## 2017-10-11 DIAGNOSIS — K529 Noninfective gastroenteritis and colitis, unspecified: Secondary | ICD-10-CM

## 2017-10-11 DIAGNOSIS — E1169 Type 2 diabetes mellitus with other specified complication: Secondary | ICD-10-CM

## 2017-10-11 DIAGNOSIS — E1065 Type 1 diabetes mellitus with hyperglycemia: Secondary | ICD-10-CM | POA: Diagnosis not present

## 2017-10-11 DIAGNOSIS — K3184 Gastroparesis: Secondary | ICD-10-CM

## 2017-10-11 DIAGNOSIS — K639 Disease of intestine, unspecified: Secondary | ICD-10-CM | POA: Diagnosis not present

## 2017-10-11 DIAGNOSIS — E1043 Type 1 diabetes mellitus with diabetic autonomic (poly)neuropathy: Secondary | ICD-10-CM | POA: Diagnosis not present

## 2017-10-11 DIAGNOSIS — E1042 Type 1 diabetes mellitus with diabetic polyneuropathy: Secondary | ICD-10-CM | POA: Diagnosis not present

## 2017-10-11 MED ORDER — PEPPERMINT OIL 90 MG PO CPCR: 2.0000 | ORAL_CAPSULE | Freq: Three times a day (TID) | ORAL | 0 refills | Status: DC | PRN

## 2017-10-11 MED ORDER — GLUCOSE BLOOD VI STRP: ORAL_STRIP | 12 refills | Status: AC

## 2017-10-11 MED FILL — TRUE METRIX TEST STRIP: 30 days supply | Qty: 100 | Fill #0

## 2017-10-11 NOTE — Progress Notes (Signed)
Patient ID: Duane [name], male   DOB: [birth year], 42 y.o.   MRN: [id number]           Reason for Appointment : Consultation for Type 1 Diabetes  History of Present Illness          Diagnosis: Type 1 diabetes mellitus, date of diagnosis: 04/2003         Previous history:  He was initially diagnosed with symptoms of high blood sugars needing hospitalization with a glucose of about 1100 Initially sent home on insulin and he thinks he was able to get off insulin for about a year after this Subsequently went back on insulin  Previous records are not available as he was under medical care in [place] Not clear what level of control he has had in the past   Recent history:  His A1c is recently 13%, previously 14.5  INSULIN regimen is described as: Lantus 60 hs  NovoLog with meals 15-20 units before eating   CURRENT management, blood sugar patterns and problems identified:   He has gained a significant amount of weight in the last few years  Also his blood sugars have been more difficult to control, previously A1c would be about 9% and now 11-14  Because of being on disability he has been depending on patient assistance program for his insulin  Currently checking his blood sugars with a true Metrix monitor from the wellness program and did not bring any record of his readings  His blood sugars are usually in the morning, averaging nearly 200  He thinks that his blood sugars may go up after meals depending on what he is eating and occasionally up to 350  He says he likes to eat a lot of carbohydrates although not eating a lot of fried food or sweets  In the past he was having problems with gastroparesis and not eating as well but now is able to eat normally  He is usually taking about the same amount of insulin at mealtimes, usually between 15 to 20 units and not based on his blood sugar level or meal size  Had not been able to do any exercise because of knee pain and  neuropathy     Glucose monitoring:  done times a day         Glucometer: True    Blood Glucose readings from recall as follows:    PRE-MEAL Fasting Lunch Dinner Bedtime Overall  Glucose range: 180-200      Mean/median:        POST-MEAL PC Breakfast PC Lunch PC Dinner  Glucose range:     Mean/median:  upto 350 200-350          Hypoglycemia: none        Self-care: The diet that the patient has been following is: None  Meals: 2-3 meals per day. Eating and  8 pm; 2 am occasionally and at 8 am      Breakfast is sometimes skipped, otherwise eggs or breakfast sandwiches.      Exercise: 3 days a week , aerobic and weightlifting for up to one hour in the afternoon         Dietician consultation: Most recent: Years ago.         CDE consultation: None Retinal exam: Most recent: years ago    Diabetes labs:   Lab Results  Component Value Date   HGBA1C 13.0 (H) 08/02/2017   HGBA1C 14.5 05/17/2017   HGBA1C 11.5 01/16/2017   Lab Results  Component Value Date   MICROALBUR 11.3 (H) 09/13/2014   LDLCALC 65 05/17/2017   CREATININE 1.70 (H) 08/02/2017    Allergies as of 10/11/2017   No Known Allergies     Medication List        Accurate as of 10/11/17  2:46 PM. Always use your most recent med list.          aluminum chloride 20 % external solution Commonly known as:  DRYSOL Apply topically at bedtime.   amLODipine 5 MG tablet Commonly known as:  NORVASC Take by mouth.   atorvastatin 20 MG tablet Commonly known as:  LIPITOR TAKE 1 TABLET BY MOUTH DAILY.   diltiazem 180 MG 24 hr capsule Commonly known as:  CARDIZEM CD Take 1 capsule (180 mg total) by mouth daily.   ergocalciferol 50000 units capsule Commonly known as:  VITAMIN D2 Take 1 capsule (50,000 Units total) by mouth once a week.   famotidine 20 MG tablet Commonly known as:  PEPCID Take 1 tablet (20 mg total) by mouth 2 (two) times daily.   furosemide 40 MG tablet Commonly known as:  LASIX Take 1  tablet (40 mg total) by mouth daily.   gabapentin 300 MG capsule Commonly known as:  NEURONTIN Take 1 capsule (300 mg total) by mouth at bedtime.   glucose blood test strip 1 each by Other route 3 (three) times daily.   glucose blood test strip Use as instructed   Insulin Glargine 100 UNIT/ML Solostar Pen Commonly known as:  LANTUS Inject 35 Units into the skin 2 (two) times daily.   insulin lispro 100 UNIT/ML KiwkPen Commonly known as:  HUMALOG Inject 0.15 mLs (15 Units total) into the skin 3 (three) times daily.   Insulin Pen Needle 31G X 8 MM Misc Inject 1 each into the skin 4 (four) times daily. Check blood sugar TID and QHS   lisinopril 40 MG tablet Commonly known as:  PRINIVIL,ZESTRIL Take by mouth.   metoCLOPramide 10 MG tablet Commonly known as:  REGLAN TAKE 1 TABLET BY MOUTH 3 TIMES DAILY BEFORE MEALS AS NEEDED   metoprolol tartrate 50 MG tablet Commonly known as:  LOPRESSOR Take by mouth.   ondansetron 4 MG disintegrating tablet Commonly known as:  ZOFRAN-ODT Take by mouth.   pantoprazole 40 MG tablet Commonly known as:  PROTONIX Take 1 tablet (40 mg total) by mouth daily.   potassium chloride 10 MEQ CR capsule Commonly known as:  MICRO-K TAKE 1 TABLET BY MOUTH DAILY.   promethazine 25 MG tablet Commonly known as:  PHENERGAN TAKE 1 TABLET BY MOUTH EVERY 8 HOURS AS NEEDED FOR NAUSEA OR VOMITING.   rifaximin 550 MG Tabs tablet Commonly known as:  XIFAXAN Take 1 tablet (550 mg total) by mouth 3 (three) times daily.   sucralfate 1 g tablet Commonly known as:  CARAFATE Take 1 tablet (1 g total) by mouth 4 (four) times daily -  with meals and at bedtime.   Tadalafil 2.5 MG Tabs Take 1 tablet (2.5 mg total) by mouth daily.   terbinafine 250 MG tablet Commonly known as:  LAMISIL Take 1 tablet (250 mg total) by mouth daily.   TRUE METRIX METER w/Device Kit 1 each by Does not apply route 4 (four) times daily -  before meals and at bedtime.    TRUEPLUS LANCETS 28G Misc 1 each by Does not apply route 4 (four) times daily -  before meals and at bedtime.       Allergies:  No Known Allergies  Past Medical History:  Diagnosis Date  . Depression   . Diabetes mellitus without complication [facility]) age 42   insulin requiring from the start  . Diabetic neuropathy ([place]) Dx 20110  . Diabetic proliferative retinopathy ([place]) 08/02/2017  . Gastritis   . Gastroparesis 2012   100% gastric retention on 08/2013 GES  . GERD (gastroesophageal reflux disease) Dx 2012  . Hypertension Dx 2012  . Obesity     Past Surgical History:  Procedure Laterality Date  . CHOLECYSTECTOMY  2013  . ESOPHAGOGASTRODUODENOSCOPY N/A 06/11/2013   Procedure: ESOPHAGOGASTRODUODENOSCOPY (EGD);  Surgeon: [name], MD;  Location: [facility];  Service: Endoscopy;  Laterality: N/A;  . EYE SURGERY    . FLEXIBLE SIGMOIDOSCOPY N/A 06/11/2013   Procedure: FLEXIBLE SIGMOIDOSCOPY;  Surgeon: [name], MD;  Location: [facility];  Service: Endoscopy;  Laterality: N/A;  . KNEE SURGERY Left     Family History  Problem Relation Age of Onset  . Diabetes Father   . Diabetes Maternal Grandmother   . Diabetes Paternal Grandmother   . Colon cancer Neg Hx   . Stomach cancer Neg Hx     Social History:  reports that he has never smoked. He has never used smokeless tobacco. He reports that he does not drink alcohol or use drugs.     Review of Systems  Constitutional: Positive for weight gain.  Eyes: Negative for blurred vision.  Respiratory: Negative for shortness of breath.   Cardiovascular: Positive for leg swelling.  Gastrointestinal: Positive for nausea and diarrhea.       Gastroparesis is treated by gastroenterology since Reglan 3 times a day  Endocrine: Positive for erectile dysfunction.       Occasionally will feel excessively thirsty  Genitourinary: Positive for frequency.  Musculoskeletal: Positive for joint pain.       Mostly knee joint pain  Skin:        Has also on his right fifth toe followed by podiatrist, was recommended Silvadene but he has not started this  Neurological: Positive for numbness and tingling.       Has sharp pains and pins-and-needles in his feet controlled with gabapentin         Lipids:  Treated with atorvastatin  Lab Results  Component Value Date   CHOL 138 05/17/2017   HDL 29 (L) 05/17/2017   LDLCALC 65 05/17/2017   TRIG 221 (H) 05/17/2017   CHOLHDL 4.8 05/17/2017   HYPERTENSION currently being treated with diltiazem, amlodipine and lisinopril  CRF: Followed by nephrologist and recently seen  Lab Results  Component Value Date   CREATININE 1.70 (H) 08/02/2017   CREATININE 1.95 (H) 05/17/2017   CREATININE 1.79 (H) 08/20/2016     LABS:  No visits with results within 1 Week(s) from this visit.  Latest known visit with results is:  Office Visit on 08/20/2017  Component Date Value Ref Range Status  . POC Glucose 08/20/2017 232* 70 - 99 mg/dl Final    Physical Examination:  BP 128/86 (BP Location: Right Arm, Patient Position: Standing, Cuff Size: Large)   Pulse 75   Ht 6' 4"  (1.93 m)   Wt (!) 349 lb (158.3 kg)   SpO2 96%   BMI 42.48 kg/m   GENERAL:  generalized obesity present.  HEENT:  Eyes: Externally normal.  Unable to focus on fundi for examination of posterior chamber . Oral exam shows normal mucosa .  NECK:         General:  Neck exam shows no lymphadenopathy.  Carotids are normal to palpation and no bruit heard.  Thyroid is not enlarged and no nodules felt.    LUNGS:         Chest is symmetrical. Lungs are clear to auscultation.Marland Kitchen   HEART:         Heart sounds:  S1 and S2 are normal.  Short ejection murmur at base heard Has no S3 or S4.    ABDOMEN:  no distention present. Liver and spleen are not palpable.  No other mass or tenderness present.  EXTREMITIES:     There is no edema. No skin lesions present.  NEUROLOGICAL:        Vibration sense is absent at the distal  toes  Ankle jerks are absent bilaterally.           Diabetic Foot Exam - Simple   Simple Foot Form Diabetic Foot exam was performed with the following findings:  Yes 10/11/2017  2:45 PM  Visual Inspection See comments:  Yes Sensation Testing See comments:  Yes Pulse Check Posterior Tibialis and Dorsalis pulse intact bilaterally:  Yes See comments:  Yes Comments Absent monofilament sensation in feet Shallow 1 cm ulcer on the right fifth toe laterally distally with clean age and no redness. Posterior tibialis pulses not felt because of edema     MUSCULOSKELETAL:       There is no enlargement or deformity of the peripheral joints.  SKIN: Some acanthosis of neck present See foot exam for further details also     ASSESSMENT:  Diabetes type 1, long-standing  He has had  persistent poor control associated with multiple complications   Last Y6R was 13% and has been significantly high, over 11% since 2017   Problems identified: Inadequate insulin regimen both basal and bolus with His fasting readings are consistently high around 200 likely from inadequate basal Also has variable blood sugars increases after his meals with not taking enough NovoLog insulin Inadequate meal planning with sometimes unbalanced meals and excessive carbohydrate Lack of exercise and weight gain  Complications: Gastroparesis and recurrent diarrhea, severe peripheral neuropathy with sensory loss Retinopathy, proliferative Erectile dysfunction Diabetic nephropathy with proteinuria and mild renal insufficiency   HYPERTENSION and hyperlipidemia: Fairly well controlled  PLAN:    Discussed need for adequate coverage of both basal and mealtime insulin requirements  He will need to split the Lantus to twice a day for more consistent 24-hour coverage  Since he is hyperglycemic he will take 40 units twice a day and then adjust the dose based on fasting readings further  Because of his variable diet he  will benefit from carbohydrate counting and will use the calorie Edison Pace app to calculate his carbohydrate intake  For now he can try to divide his total carbohydrate intake by 5 for his mealtime insulin  Also if he is eating more than he anticipated he can take the balance of insulin coverage for his carbs after eating also  He will need to cut back on portions of meals and calories to enable some weight loss since he currently cannot exercise  Consultation with dietitian  He is interested in insulin pump and this may be helpful to reduce his insulin requirement, more efficient insulin delivery and better management of mealtime insulin.  Discussed the use of the [employer]tronic insulin pump and showed him how this works  He will have a fasting C-peptide done on his next labs  Advised him to use the Silvadene ointment  for his FOOT ULCER prescribed by podiatrist and have regular follow-up  Given him the contour next glucose meter and he will start checking blood sugars with this 4 times a day, discussed blood sugar targets at various times  And if he is able to monitor 4 times a day and document this he will be eligible for at least a freestyle libre system and discussed that this would be more complete evaluation of his blood sugar patterns with more convenience   Patient Instructions  Calorie Edison Pace app  Novolog: DIVIDE Total CARBS by 5  and that decide on dose  Lantus 40  units about 12 hours apart   When out, Then switch to Toujeo  Check blood sugars on waking up  daily  Also check blood sugars about 2 hours after a meal and do this after different meals by rotation  Recommended blood sugar levels on waking up is 90-130 and about 2 hours after meal is 130-160  Please bring your blood sugar monitor to each visit, thank you    Counseling time on subjects discussed in assessment and plan sections is over 50% of today's 60 minute visit    Elayne Snare 10/11/2017, 2:46 PM   Note: This  note was prepared with Dragon voice recognition system technology. Any transcriptional errors that result from this process are unintentional.

## 2017-10-11 NOTE — Assessment & Plan Note (Signed)
Improved Work on diet + DM control

## 2017-10-11 NOTE — Patient Instructions (Addendum)
Calorie Edison Pace app  Novolog: DIVIDE Total CARBS by 5  and that decide on dose  Lantus 40  units about 12 hours apart   When out, Then switch to Toujeo  Check blood sugars on waking up  daily  Also check blood sugars about 2 hours after a meal and do this after different meals by rotation  Recommended blood sugar levels on waking up is 90-130 and about 2 hours after meal is 130-160  Please bring your blood sugar monitor to each visit, thank you

## 2017-10-11 NOTE — Progress Notes (Signed)
Duane [name] 42 y.o. 1975-08-17 [id number]  Assessment & Plan:    Diabetic gastroparesis associated with type 1 diabetes mellitus ([place]) Improved Work on diet + DM control  Diabetic enteropathy ([place]) likely Try IB Donald Prose Stop taking prn Xifaxan  Chronic diarrhea Stop prn Xifaxan Try IB Gard prn    Subjective:   Chief Complaint: Diabetic gastroparesis  HPI Duane [name] reports that he is generally doing fairly well he having some episodic diarrhea for which she will take Xifaxan for a day or 2 since he has a large supply of this.  He regurgitates and vomits some in the mornings.  He relates that the eating late at night.  He does not eat 3 meals a day.  He saw Dr. Dwyane Dee today and his insulin is being adjusted and is being worked up to see if he can get an insulin pump. No Known Allergies Current Meds  Medication Sig  . aluminum chloride (DRYSOL) 20 % external solution Apply topically at bedtime.  Marland Kitchen amLODipine (NORVASC) 5 MG tablet Take by mouth.  Marland Kitchen atorvastatin (LIPITOR) 20 MG tablet TAKE 1 TABLET BY MOUTH DAILY.  Marland Kitchen Blood Glucose Monitoring Suppl (TRUE METRIX METER) W/DEVICE KIT 1 each by Does not apply route 4 (four) times daily -  before meals and at bedtime.  Marland Kitchen diltiazem (CARDIZEM CD) 180 MG 24 hr capsule Take 1 capsule (180 mg total) by mouth daily.  . ergocalciferol (DRISDOL) 50000 units capsule Take 1 capsule (50,000 Units total) by mouth once a week.  . famotidine (PEPCID) 20 MG tablet Take 1 tablet (20 mg total) by mouth 2 (two) times daily.  . furosemide (LASIX) 40 MG tablet Take 1 tablet (40 mg total) by mouth daily.  Marland Kitchen gabapentin (NEURONTIN) 300 MG capsule Take 1 capsule (300 mg total) by mouth at bedtime.  Marland Kitchen glucose blood (CONTOUR NEXT TEST) test strip Use as instructed  . glucose blood (TRUE METRIX BLOOD GLUCOSE TEST) test strip 1 each by Other route 3 (three) times daily.  . Insulin Glargine (LANTUS SOLOSTAR) 100 UNIT/ML Solostar Pen Inject 35 Units into the skin 2  (two) times daily. (Patient taking differently: Inject 60 Units into the skin 2 (two) times daily. )  . insulin lispro (HUMALOG KWIKPEN) 100 UNIT/ML KiwkPen Inject 0.15 mLs (15 Units total) into the skin 3 (three) times daily.  . Insulin Pen Needle 31G X 8 MM MISC Inject 1 each into the skin 4 (four) times daily. Check blood sugar TID and QHS  . lisinopril (PRINIVIL,ZESTRIL) 40 MG tablet Take by mouth.  . metoCLOPramide (REGLAN) 10 MG tablet TAKE 1 TABLET BY MOUTH 3 TIMES DAILY BEFORE MEALS AS NEEDED  . metoprolol tartrate (LOPRESSOR) 50 MG tablet Take by mouth.  . ondansetron (ZOFRAN-ODT) 4 MG disintegrating tablet Take by mouth.  . pantoprazole (PROTONIX) 40 MG tablet Take 1 tablet (40 mg total) by mouth daily.  . potassium chloride (MICRO-K) 10 MEQ CR capsule TAKE 1 TABLET BY MOUTH DAILY.  Marland Kitchen promethazine (PHENERGAN) 25 MG tablet TAKE 1 TABLET BY MOUTH EVERY 8 HOURS AS NEEDED FOR NAUSEA OR VOMITING.  . rifaximin (XIFAXAN) 550 MG TABS tablet Take 1 tablet (550 mg total) by mouth 3 (three) times daily.  . sucralfate (CARAFATE) 1 g tablet Take 1 tablet (1 g total) by mouth 4 (four) times daily -  with meals and at bedtime.  . tadalafil 2.5 MG TABS Take 1 tablet (2.5 mg total) by mouth daily.  Marland Kitchen terbinafine (LAMISIL) 250 MG tablet Take 1  tablet (250 mg total) by mouth daily.  . TRUEPLUS LANCETS 28G MISC 1 each by Does not apply route 4 (four) times daily -  before meals and at bedtime.   Past Medical History:  Diagnosis Date  . Depression   . Diabetes mellitus without complication [facility]) age 60   insulin requiring from the start  . Diabetic neuropathy ([place]) Dx 20110  . Diabetic proliferative retinopathy ([place]) 08/02/2017  . Gastritis   . Gastroparesis 2012   100% gastric retention on 08/2013 GES  . GERD (gastroesophageal reflux disease) Dx 2012  . Hypertension Dx 2012  . Obesity    Past Surgical History:  Procedure Laterality Date  . CHOLECYSTECTOMY  2013  . ESOPHAGOGASTRODUODENOSCOPY N/A  06/11/2013   Procedure: ESOPHAGOGASTRODUODENOSCOPY (EGD);  Surgeon: [name], MD;  Location: [facility];  Service: Endoscopy;  Laterality: N/A;  . EYE SURGERY    . FLEXIBLE SIGMOIDOSCOPY N/A 06/11/2013   Procedure: FLEXIBLE SIGMOIDOSCOPY;  Surgeon: [name], MD;  Location: [facility];  Service: Endoscopy;  Laterality: N/A;  . KNEE SURGERY Left    Social History   Social History Narrative   He is single and a disabled security guard   1 son at least, I do not know if he has other children   No alcohol tobacco or drug use reported no smoking   family history includes Diabetes in his father, maternal grandmother, and paternal grandmother.   Review of Systems As above  Objective:   Physical Exam  BP 110/70   Pulse 94   Ht _0  (1.93 m)   Wt (!) 351 lb (159.2 kg)   BMI 42.73 kg/m  No acute distress No signs of tardive dyskinesia Lungs are clear Heart sounds are normal The abdomen is obese and nontender and bowel sounds are diminished are present

## 2017-10-11 NOTE — Assessment & Plan Note (Signed)
Stop prn Xifaxan Try IB Gard prn

## 2017-10-11 NOTE — Assessment & Plan Note (Signed)
Try Duane [name] Stop taking prn Xifaxan

## 2017-10-11 NOTE — Patient Instructions (Addendum)
  Today we are giving you samples of IBgard to try.   Stop the xifaxan.   Follow up with [name] in 6 months.   I appreciate the opportunity to care for you. Silvano Rusk, MD, [facility]

## 2017-10-17 ENCOUNTER — Ambulatory Visit
Admission: RE | Admit: 2017-10-17 | Discharge: 2017-10-17 | Disposition: A | Discharge status: Home / Self Care | Payer: Medicare Other | Source: Ambulatory Visit | Attending: Nephrology | Admitting: Nephrology

## 2017-10-17 ENCOUNTER — Other Ambulatory Visit: Payer: Self-pay | Admitting: Family Medicine

## 2017-10-17 DIAGNOSIS — E669 Obesity, unspecified: Secondary | ICD-10-CM

## 2017-10-17 DIAGNOSIS — N189 Chronic kidney disease, unspecified: Secondary | ICD-10-CM | POA: Diagnosis not present

## 2017-10-17 DIAGNOSIS — I129 Hypertensive chronic kidney disease with stage 1 through stage 4 chronic kidney disease, or unspecified chronic kidney disease: Secondary | ICD-10-CM

## 2017-10-17 DIAGNOSIS — N183 Chronic kidney disease, stage 3 unspecified: Secondary | ICD-10-CM

## 2017-10-17 DIAGNOSIS — E0843 Diabetes mellitus due to underlying condition with diabetic autonomic (poly)neuropathy: Secondary | ICD-10-CM

## 2017-10-17 MED FILL — LISINOPRIL-HCTZ 20-12.5 MG: 20-12.5 | 30 days supply | Qty: 60 | Fill #5

## 2017-10-17 MED FILL — $HUMALOG 100 UNITS/ML KWIKP: 100 | 30 days supply | Qty: 15 | Fill #1

## 2017-10-17 MED FILL — CARTIA XT 180 MG CAPSULE SA: 180 | 30 days supply | Qty: 30 | Fill #5

## 2017-10-17 MED FILL — PANTOPRAZOLE SOD DR 40 MG T: 40 | 30 days supply | Qty: 30 | Fill #5

## 2017-10-17 MED FILL — ATORVASTATIN 20 MG TABLET: 20 | 30 days supply | Qty: 30 | Fill #2

## 2017-10-18 ENCOUNTER — Other Ambulatory Visit: Payer: Self-pay | Admitting: Family Medicine

## 2017-10-18 DIAGNOSIS — K3184 Gastroparesis: Principal | ICD-10-CM

## 2017-10-18 DIAGNOSIS — E1043 Type 1 diabetes mellitus with diabetic autonomic (poly)neuropathy: Secondary | ICD-10-CM

## 2017-10-18 MED FILL — GABAPENTIN 300 MG CAPSULE: 300 | 30 days supply | Qty: 30 | Fill #0

## 2017-10-18 MED FILL — SUCRALFATE 1 GM TABLET: 1 | 30 days supply | Qty: 120 | Fill #0

## 2017-10-22 MED FILL — POTASSIUM CL ER 10 MEQ CAP: 10 | 30 days supply | Qty: 30 | Fill #3

## 2017-10-22 MED FILL — METOCLOPRAMIDE 10 MG TABLET: 10 | 30 days supply | Qty: 90 | Fill #2

## 2017-11-12 MED FILL — $LANTUS SOLOSTAR 100 UNITS/: 100 | 30 days supply | Qty: 21 | Fill #2

## 2017-11-14 ENCOUNTER — Other Ambulatory Visit: Payer: Self-pay | Admitting: Family Medicine

## 2017-11-14 DIAGNOSIS — I10 Essential (primary) hypertension: Secondary | ICD-10-CM

## 2017-11-14 MED FILL — PANTOPRAZOLE SOD DR 40 MG T: 40 | 30 days supply | Qty: 30 | Fill #0

## 2017-11-14 MED FILL — GABAPENTIN 300 MG CAPSULE: 300 | 30 days supply | Qty: 30 | Fill #1

## 2017-11-14 MED FILL — DILTIAZEM 24HR ER 180 MG CA: 180 | 30 days supply | Qty: 30 | Fill #0

## 2017-11-20 ENCOUNTER — Encounter: Payer: Self-pay | Admitting: Licensed Clinical Social Worker

## 2017-11-20 ENCOUNTER — Ambulatory Visit: Payer: Medicare Other | Attending: Family Medicine | Admitting: Family Medicine

## 2017-11-20 ENCOUNTER — Encounter: Payer: Self-pay | Admitting: Family Medicine

## 2017-11-20 ENCOUNTER — Ambulatory Visit (HOSPITAL_BASED_OUTPATIENT_CLINIC_OR_DEPARTMENT_OTHER): Payer: Medicare Other | Admitting: Licensed Clinical Social Worker

## 2017-11-20 VITALS — BP 130/78 | HR 86 | Temp 98.3°F | Ht 76.0 in | Wt 355.6 lb

## 2017-11-20 DIAGNOSIS — Z794 Long term (current) use of insulin: Secondary | ICD-10-CM | POA: Diagnosis not present

## 2017-11-20 DIAGNOSIS — E1169 Type 2 diabetes mellitus with other specified complication: Secondary | ICD-10-CM

## 2017-11-20 DIAGNOSIS — K3184 Gastroparesis: Secondary | ICD-10-CM | POA: Diagnosis not present

## 2017-11-20 DIAGNOSIS — L97511 Non-pressure chronic ulcer of other part of right foot limited to breakdown of skin: Secondary | ICD-10-CM | POA: Diagnosis not present

## 2017-11-20 DIAGNOSIS — Z79899 Other long term (current) drug therapy: Secondary | ICD-10-CM | POA: Diagnosis not present

## 2017-11-20 DIAGNOSIS — I10 Essential (primary) hypertension: Secondary | ICD-10-CM | POA: Diagnosis not present

## 2017-11-20 DIAGNOSIS — E1022 Type 1 diabetes mellitus with diabetic chronic kidney disease: Secondary | ICD-10-CM | POA: Diagnosis not present

## 2017-11-20 DIAGNOSIS — F329 Major depressive disorder, single episode, unspecified: Secondary | ICD-10-CM | POA: Diagnosis not present

## 2017-11-20 DIAGNOSIS — E1042 Type 1 diabetes mellitus with diabetic polyneuropathy: Secondary | ICD-10-CM | POA: Insufficient documentation

## 2017-11-20 DIAGNOSIS — K639 Disease of intestine, unspecified: Secondary | ICD-10-CM

## 2017-11-20 DIAGNOSIS — E1043 Type 1 diabetes mellitus with diabetic autonomic (poly)neuropathy: Secondary | ICD-10-CM | POA: Diagnosis not present

## 2017-11-20 DIAGNOSIS — E10621 Type 1 diabetes mellitus with foot ulcer: Secondary | ICD-10-CM | POA: Diagnosis not present

## 2017-11-20 DIAGNOSIS — N183 Chronic kidney disease, stage 3 unspecified: Secondary | ICD-10-CM

## 2017-11-20 DIAGNOSIS — E669 Obesity, unspecified: Secondary | ICD-10-CM | POA: Insufficient documentation

## 2017-11-20 DIAGNOSIS — F321 Major depressive disorder, single episode, moderate: Secondary | ICD-10-CM | POA: Diagnosis not present

## 2017-11-20 DIAGNOSIS — I129 Hypertensive chronic kidney disease with stage 1 through stage 4 chronic kidney disease, or unspecified chronic kidney disease: Secondary | ICD-10-CM | POA: Insufficient documentation

## 2017-11-20 DIAGNOSIS — Z6841 Body Mass Index (BMI) 40.0 and over, adult: Secondary | ICD-10-CM | POA: Diagnosis not present

## 2017-11-20 DIAGNOSIS — R05 Cough: Secondary | ICD-10-CM | POA: Diagnosis present

## 2017-11-20 DIAGNOSIS — E0843 Diabetes mellitus due to underlying condition with diabetic autonomic (poly)neuropathy: Secondary | ICD-10-CM

## 2017-11-20 DIAGNOSIS — E119 Type 2 diabetes mellitus without complications: Secondary | ICD-10-CM | POA: Diagnosis present

## 2017-11-20 DIAGNOSIS — R0982 Postnasal drip: Secondary | ICD-10-CM | POA: Insufficient documentation

## 2017-11-20 DIAGNOSIS — F32A Depression, unspecified: Secondary | ICD-10-CM | POA: Insufficient documentation

## 2017-11-20 DIAGNOSIS — K219 Gastro-esophageal reflux disease without esophagitis: Secondary | ICD-10-CM | POA: Diagnosis not present

## 2017-11-20 DIAGNOSIS — F332 Major depressive disorder, recurrent severe without psychotic features: Secondary | ICD-10-CM

## 2017-11-20 DIAGNOSIS — R600 Localized edema: Secondary | ICD-10-CM | POA: Diagnosis not present

## 2017-11-20 LAB — POCT GLYCOSYLATED HEMOGLOBIN (HGB A1C): HEMOGLOBIN A1C: 12.7 % — AB (ref 4.0–5.6)

## 2017-11-20 LAB — GLUCOSE, POCT (MANUAL RESULT ENTRY): POC GLUCOSE: 262 mg/dL — AB (ref 70–99)

## 2017-11-20 MED ORDER — FUROSEMIDE 40 MG PO TABS: 40.0000 mg | ORAL_TABLET | Freq: Every day | ORAL | 6 refills | Status: DC

## 2017-11-20 MED ORDER — FLUOXETINE HCL 20 MG PO TABS: 20.0000 mg | ORAL_TABLET | Freq: Every day | ORAL | 6 refills | Status: DC

## 2017-11-20 MED ORDER — GABAPENTIN 300 MG PO CAPS: 300.0000 mg | ORAL_CAPSULE | Freq: Every day | ORAL | 6 refills | Status: DC

## 2017-11-20 MED ORDER — CETIRIZINE HCL 10 MG PO TABS: 10.0000 mg | ORAL_TABLET | Freq: Every day | ORAL | 1 refills | Status: DC

## 2017-11-20 MED ORDER — ATORVASTATIN CALCIUM 20 MG PO TABS: 20.0000 mg | ORAL_TABLET | Freq: Every day | ORAL | 6 refills | Status: DC

## 2017-11-20 MED ORDER — DILTIAZEM HCL ER COATED BEADS 180 MG PO CP24: 180.0000 mg | ORAL_CAPSULE | Freq: Every day | ORAL | 6 refills | Status: DC

## 2017-11-20 MED ORDER — CEPHALEXIN 500 MG PO CAPS: 500.0000 mg | ORAL_CAPSULE | Freq: Two times a day (BID) | ORAL | 0 refills | Status: DC

## 2017-11-20 MED ORDER — FLUTICASONE PROPIONATE 50 MCG/ACT NA SUSP: 2.0000 | Freq: Every day | NASAL | 6 refills | Status: DC

## 2017-11-20 MED FILL — ATORVASTATIN 20 MG TABLET: 20 | 30 days supply | Qty: 30 | Fill #0

## 2017-11-20 MED FILL — FUROSEMIDE 40 MG TAB: 40 | 30 days supply | Qty: 30 | Fill #0

## 2017-11-20 NOTE — Progress Notes (Signed)
Patient has been coughing and having a stuffy nose.

## 2017-11-20 NOTE — Progress Notes (Signed)
Subjective:  Patient ID: Duane [name], male    DOB: 1975/09/12  Age: 42 y.o. MRN: [id number]  CC: Diabetes and Cough   HPI Duane [name]  is a 42 year old male with history of type 1 diabetes mellitus (A1c12.7), diabetic neuropathy, diabetic gastroparesis hypertension, stage III chronic kidney disease who presents today for a follow-up visit. He was recently seen by his Endocrinologist, Dr Dwyane Dee and his Lantus increased to 16m bid and consideration for insulin pump in the near future as per notes. BElmolacks motivation to eat right or exercise as he is depressed due to his medical conditions.He denies suicidal ideations but is overwhelmed by his conditions which do not seem to be improving. His A1c has been persistently high and his gastroparesis has been uncontrolled with persisting diarrhea which did not respond to Xifaxan. [facility] and diagnosed with Diabetic enteropathy and IB GDonald Prosecommenced which he says has been ineffective.  [facility] and is concerned he may end upon Hemodialysis.]He has had a right fifth toe ulcer for the last couple of month with a clear [facility]. He has had cough,stuffy nose, nasal congestion alternating with rhinorrhea, sensation of phlegm in his throat but denies fevers or myalgias. 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  Past Medical History:  Diagnosis Date  . Depression   . Diabetes mellitus without complication ([facility] age 42  insulin requiring from the start  . [place] [address]  . Di[place] proliferative retinopathy ([place] 08/02/2017  . Gastritis   . Gastroparesis 2012   100% gastric retention on 08/2013 GES  . GERD (gastroesophageal reflux disease) Dx 2012  . Hypertension Dx 2012  . Obesity     Past Surgical History:    Procedure Laterality Date  . CHOLECYSTECTOMY  2013  . ESOPHAGOGASTRODUODENOSCOPY N/A 06/11/2013   Procedure: ESOPHAGOGASTRODUODENOSCOPY (EGD);  Surgeon: CGatha Mayer MD;  [facility];  Service: Endoscopy;  Laterality: N/A;  . EYE SURGERY    . FLEXIBLE SIGMOIDOSCOPY N/A 06/11/2013   Procedure: FLEXIBLE SIGMOIDOSCOPY;  Surgeon: CGatha Mayer MD;  Location: [facility];  Service: Endoscopy;  Laterality: N/A;  . KNEE SURGERY Left     No Known Allergies   Outpatient Medications Prior to Visit  Medication Sig Dispense Refill  . aluminum chloride (DRYSOL) 20 % external solution Apply topically at bedtime. 35 mL 1  . amLODipine (NORVASC) 5 MG tablet Take by mouth.    . Blood Glucose Monitoring Suppl (TRUE METRIX METER) W/DEVICE KIT 1 each by Does not apply route 4 (four) times daily -  before meals and at bedtime. 1 kit ea  . ergocalciferol (DRISDOL) 50000 units capsule Take 1 capsule (50,000 Units total) by mouth once a week. 9 capsule 1  . gabapentin (NEURONTIN) 300 MG capsule Take 1 capsule (300 mg total) by mouth at bedtime. 30 capsule 5  . glucose blood (CONTOUR NEXT TEST) test strip Use as instructed 100 each 12  . glucose blood (TRUE METRIX BLOOD GLUCOSE TEST) test strip 1 each by Other route 3 (three) times daily. 100 each 11  . Insulin Glargine (LANTUS SOLOSTAR) 100 UNIT/ML Solostar Pen Inject 35 Units into the skin 2 (two) times daily. (Patient taking differently: Inject 60 Units into the skin 2 (two) times daily. ) 90 mL 3  .  insulin lispro (HUMALOG KWIKPEN) 100 UNIT/ML KiwkPen Inject 0.15 mLs (15 Units total) into the skin 3 (three) times daily. 45 mL 5  . Insulin Pen Needle 31G X 8 MM MISC Inject 1 each into the skin 4 (four) times daily. Check blood sugar TID and QHS 120 each 11  . lisinopril (PRINIVIL,ZESTRIL) 40 MG tablet Take by mouth.    . metoCLOPramide (REGLAN) 10 MG tablet TAKE 1 TABLET BY MOUTH 3 TIMES DAILY BEFORE MEALS AS NEEDED 90 tablet 5  . metoprolol  tartrate (LOPRESSOR) 50 MG tablet Take by mouth.    . ondansetron (ZOFRAN-ODT) 4 MG disintegrating tablet Take by mouth.    . pantoprazole (PROTONIX) 40 MG tablet Take 1 tablet (40 mg total) by mouth daily. 30 tablet 5  . Peppermint Oil (IBGARD) 90 MG CPCR Take 2 capsules by mouth 3 (three) times daily as needed. 16 capsule 0  . potassium chloride (MICRO-K) 10 MEQ CR capsule TAKE 1 TABLET BY MOUTH DAILY. 30 capsule 5  . promethazine (PHENERGAN) 25 MG tablet TAKE 1 TABLET BY MOUTH EVERY 8 HOURS AS NEEDED FOR NAUSEA OR VOMITING. 60 tablet 0  . sucralfate (CARAFATE) 1 g tablet TAKE 1 TABLET BY MOUTH 4 TIMES DAILY WITH MEALS AND AT BEDTIME. 120 tablet 2  . tadalafil 2.5 MG TABS Take 1 tablet (2.5 mg total) by mouth daily. 30 tablet 5  . terbinafine (LAMISIL) 250 MG tablet Take 1 tablet (250 mg total) by mouth daily. 30 tablet 1  . TRUEPLUS LANCETS 28G MISC 1 each by Does not apply route 4 (four) times daily -  before meals and at bedtime. 100 each 12  . atorvastatin (LIPITOR) 20 MG tablet TAKE 1 TABLET BY MOUTH DAILY. 30 tablet 2  . CARTIA XT 180 MG 24 hr capsule TAKE 1 CAPSULE (180 MG TOTAL) BY MOUTH DAILY. 30 capsule 0  . furosemide (LASIX) 40 MG tablet Take 1 tablet (40 mg total) by mouth daily. 30 tablet 3  . gabapentin (NEURONTIN) 300 MG capsule TAKE ONE CAPSULE BY MOUTH DAILY AT BEDTIME 30 capsule 3   No facility-administered medications prior to visit.     ROS Review of Systems  Constitutional: Negative for activity change and appetite change.  HENT: Negative for sinus pressure and sore throat.   Eyes: Negative for visual disturbance.  Respiratory: Positive for cough. Negative for chest tightness and shortness of breath.   Cardiovascular: Positive for leg swelling. Negative for chest pain.  Gastrointestinal: Positive for diarrhea. Negative for abdominal distention, abdominal pain and constipation.  Endocrine: Negative.   Genitourinary: Negative for dysuria.  Musculoskeletal: Negative  for joint swelling and myalgias.       Positive for knee pain  Skin: Negative for rash.  Allergic/Immunologic: Negative.   Neurological: Negative for weakness, light-headedness and numbness.  Psychiatric/Behavioral: Positive for dysphoric mood. Negative for suicidal ideas.    Objective:  BP 130/78   Pulse 86   Temp 98.3 F (36.8 C) (Oral)   Ht 6' 4"  (1.93 m)   Wt (!) 355 lb 9.6 oz (161.3 kg)   SpO2 95%   BMI 43.28 kg/m   BP/Weight 11/20/2017 10/11/2017 4/85/4627  Systolic BP 035 009 381  Diastolic BP 78 86 70  Wt. (Lbs) 355.6 349 351  BMI 43.28 42.48 42.73     Physical Exam  Constitutional: He is oriented to person, place, and time. He appears well-developed and well-nourished.  HENT:  Right Ear: External ear normal.  Left Ear: External ear normal.  Mouth/Throat: Oropharynx is clear and moist.  Cardiovascular: Normal rate, normal heart sounds and intact distal pulses.  No murmur heard. Pulmonary/Chest: Effort normal and breath sounds normal. He has no wheezes. He has no rales. He exhibits no tenderness.  Abdominal: Soft. Bowel sounds are normal. He exhibits no distension and no mass. There is no tenderness.  Musculoskeletal: Normal range of motion. He exhibits edema (2+ non pitting R leg edema R>L).  Right fifth toe with ulcer on lateral aspect with surrounding hyperpigmenttaion, clear discharge, no erythema, not tender.  Neurological: He is alert and oriented to person, place, and time.  Skin: Skin is warm and dry.  Psychiatric:  Dysphoric mood    Lab Results  Component Value Date   HGBA1C 12.7 (A) 11/20/2017    Assessment & Plan:   1. Type 1 diabetes mellitus with diabetic polyneuropathy (HCC) Uncontrolled with A1c of 12.7 Lack of motivation for lifestyle changes due to underlying Depression Regimen recently adjusted by Endocrine Counseled on Diabetic diet, my plate method, 275 minutes of moderate intensity exercise/week Keep blood sugar logs with fasting  goals of 80-120 mg/dl, random of less than 180 and in the event of sugars less than 60 mg/dl or greater than 400 mg/dl please notify the clinic ASAP. It is recommended that you undergo annual eye exams and annual foot exams. Pneumonia vaccine is recommended. - POCT glucose (manual entry) - POCT glycosylated hemoglobin (Hb A1C) - atorvastatin (LIPITOR) 20 MG tablet; Take 1 tablet (20 mg total) by mouth daily.  Dispense: 30 tablet; Refill: 6  2. Essential hypertension Controlled Counseled on blood pressure goal of less than 130/80, low-sodium, DASH diet, medication compliance, 150 minutes of moderate intensity exercise per week. Discussed medication compliance, adverse effects. - Basic Metabolic Panel - diltiazem (CARTIA XT) 180 MG 24 hr capsule; Take 1 capsule (180 mg total) by mouth daily.  Dispense: 30 capsule; Refill: 6  3. 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  Dispense: 30 tablet; Refill: 6  4. Diabetic autonomic neuropathy associated with diabetes mellitus due to underlying condition (HCC) Stable - gabapentin (NEURONTIN) 300 MG capsule; Take 1 capsule (300 mg total) by mouth at bedtime.  Dispense: 30 capsule; Refill: 6  5. Skin ulcer of right foot, limited to breakdown of skin ([place]) Chronic and has been present for months Previously seen by [facility] Advised to schedule appointment Would love to order foot xray to exclude Osteomyelitis however he states his Podiatrist already did - cephALEXin (KEFLEX) 500 MG capsule; Take 1 capsule (500 mg total) by mouth 2 (two) times daily.  Dispense: 20 capsule; Refill: 0  6. Post-nasal drip No antibiotics indicated - fluticasone (FLONASE) 50 MCG/ACT nasal spray; Place 2 sprays into both nostrils daily.  Dispense: 16 g; Refill: 6 - cetirizine (ZYRTEC) 10 MG tablet;  Take 1 tablet (10 mg total) by mouth daily.  Dispense: 30 tablet; Refill: 1  7. Diabetic enteropathy ([place]) likely Uncontrolled with persisting Diarrhea Placed on IB Guard by GI which he states is ineffective  8. Current moderate episode of major depressive disorder without prior episode ([place]) Due to underlying medical conditions LCSW called in for counseling - FLUoxetine (PROZAC) 20 MG tablet; Take 1 tablet (20 mg total) by mouth daily.  Dispense: 30 tablet; Refill: 6  9. Stage 3 chronic kidney disease (HCC) Combination of Diabetic  and Hypertensive Nephropathy Avoid nephrotoxins Followed by [facility] ordered this encounter  Medications  . cephALEXin (KEFLEX) 500 MG capsule    Sig: Take 1 capsule (500 mg total) by mouth 2 (two) times daily.    Dispense:  20 capsule    Refill:  0  . FLUoxetine (PROZAC) 20 MG tablet    Sig: Take 1 tablet (20 mg total) by mouth daily.    Dispense:  30 tablet    Refill:  6  . fluticasone (FLONASE) 50 MCG/ACT nasal spray    Sig: Place 2 sprays into both nostrils daily.    Dispense:  16 g    Refill:  6  . atorvastatin (LIPITOR) 20 MG tablet    Sig: Take 1 tablet (20 mg total) by mouth daily.    Dispense:  30 tablet    Refill:  6  . diltiazem (CARTIA XT) 180 MG 24 hr capsule    Sig: Take 1 capsule (180 mg total) by mouth daily.    Dispense:  30 capsule    Refill:  6  . furosemide (LASIX) 40 MG tablet    Sig: Take 1 tablet (40 mg total) by mouth daily.    Dispense:  30 tablet    Refill:  6  . gabapentin (NEURONTIN) 300 MG capsule    Sig: Take 1 capsule (300 mg total) by mouth at bedtime.    Dispense:  30 capsule    Refill:  6  . cetirizine (ZYRTEC) 10 MG tablet    Sig: Take 1 tablet (10 mg total) by mouth daily.    Dispense:  30 tablet    Refill:  1    Follow-up: Return in about 6 weeks (around 01/01/2018) for Follow-up on depression.   Charlott Rakes MD

## 2017-11-20 NOTE — Patient Instructions (Signed)

## 2017-11-21 LAB — BASIC METABOLIC PANEL
BUN / CREAT RATIO: 10 (ref 9–20)
BUN: 18 mg/dL (ref 6–24)
CALCIUM: 9.6 mg/dL (ref 8.7–10.2)
CHLORIDE: 101 mmol/L (ref 96–106)
CO2: 21 mmol/L (ref 20–29)
Creatinine, Ser: 1.83 mg/dL — ABNORMAL HIGH (ref 0.76–1.27)
GFR, EST AFRICAN AMERICAN: 52 mL/min/{1.73_m2} — AB (ref >59)
GFR, EST NON AFRICAN AMERICAN: 45 mL/min/{1.73_m2} — AB (ref >59)
Glucose: 273 mg/dL — ABNORMAL HIGH (ref 65–99)
Potassium: 5.2 mmol/L (ref 3.5–5.2)
Sodium: 138 mmol/L (ref 134–144)

## 2017-11-22 ENCOUNTER — Ambulatory Visit: Payer: Medicare Other | Admitting: Licensed Clinical Social Worker

## 2017-11-22 NOTE — BH Specialist Note (Signed)
Integrated Behavioral Health Initial Visit  MRN: [id number] Name: Duane [name]  Number of [place] Clinician visits:: 1/6 Session Start time: 4:05 PM  Session End time: 4:45 PM Total time: 40 minutes  Type of Service: [place] Interpretor:No. Interpretor Name and Language: N/A   SUBJECTIVE: Duane [name] is a 42 y.o. male accompanied by self Patient was referred by Dr. Margarita Rana for depression and anxiety. Patient reports the following symptoms/concerns: feelings of sadness and worry, difficulty sleeping, irritability, racing thoughts, low motivation and energy, decreased concentration, and hx of suicidal ideations Duration of problem: Ongoing; Severity of problem: severe  OBJECTIVE: Mood: Depressed and Affect: Depressed Risk of harm to self or others: No plan to harm self or others Pt scored positive on PHQ-9; however, denies current SI/HI/AVH. Protective factors were identified, safety plan was discussed, and crisis intervention resources were provided  LIFE CONTEXT: Family and Social: Pt receives limited support. He has three teenage sons that reside out of the state. Pt reports a strained relationship with their mothers resulting in difficulty with coparenting School/Work: Pt is employed part-time. He receives disability Self-Care: Pt is open to medication management Life Changes: Pt has difficulty managing ongoing medical conditions and coping with stressors  GOALS ADDRESSED: Patient will: 1. Reduce symptoms of: anxiety, depression and stress 2. Increase knowledge and/or ability of: coping skills and stress reduction  3. Demonstrate ability to: Increase healthy adjustment to current life circumstances, Increase adequate support systems for patient/family, Increase motivation to adhere to plan of care and Improve medication compliance  INTERVENTIONS: Interventions utilized: Motivational Interviewing  Standardized  Assessments completed: GAD-7 and PHQ 2&9 with C-SSRS  ASSESSMENT: Patient currently experiencing depression and anxiety triggered by difficulty managing ongoing medical conditions and coping with stressors. He has three teenage sons that reside out of the state. Pt reports a strained relationship with their mothers resulting in difficulty with coparenting. He reports feelings of sadness and worry, difficulty sleeping, irritability, racing thoughts, low motivation and energy, decreased concentration, and hx of suicidal ideations.     Pt scored positive on PHQ-9; however, denies current SI/HI/AVH. Protective factors were identified, safety plan was discussed, and crisis intervention resources were provided. Patient may benefit from psychotherapy. He is open to medication management. Pt has agreed to referral to [facility]. Strategies to increase pt's motivation and medication compliance was discussed.  PLAN: 1. Follow up with behavioral health clinician on : Pt was encouraged to contact LCSWA if symptoms worsen or fail to improve to schedule behavioral appointments at [facility]. 2. Behavioral recommendations: LCSWA recommends that pt apply healthy coping skills discussed and comply with medication management. Pt is encouraged to schedule follow up appointment with LCSWA 3. Referral(s): [place] (In Clinic) and [place] (LME/Outside Clinic) 4. "From scale of 1-10, how likely are you to follow plan?": 8  Rebekah Chesterfield, LCSW 11/22/17 5:01 PM

## 2017-11-25 ENCOUNTER — Other Ambulatory Visit: Payer: Self-pay | Admitting: Family Medicine

## 2017-11-25 DIAGNOSIS — I10 Essential (primary) hypertension: Secondary | ICD-10-CM

## 2017-11-25 MED FILL — METOCLOPRAMIDE 10 MG TABLET: 10 | 30 days supply | Qty: 90 | Fill #3

## 2017-11-25 MED FILL — POTASSIUM CL ER 10 MEQ CAP: 10 | 30 days supply | Qty: 30 | Fill #4

## 2017-11-26 MED FILL — LISINOPRIL-HCTZ 20-12.5 MG: 20-12.5 | 30 days supply | Qty: 60 | Fill #0

## 2017-11-28 ENCOUNTER — Ambulatory Visit: Payer: Self-pay | Admitting: Endocrinology

## 2017-11-29 ENCOUNTER — Telehealth: Payer: Self-pay | Admitting: Licensed Clinical Social Worker

## 2017-11-29 NOTE — Telephone Encounter (Signed)
A completed referral to [facility] was faxed to [phone]

## 2017-12-04 DIAGNOSIS — I129 Hypertensive chronic kidney disease with stage 1 through stage 4 chronic kidney disease, or unspecified chronic kidney disease: Secondary | ICD-10-CM | POA: Diagnosis not present

## 2017-12-04 DIAGNOSIS — E669 Obesity, unspecified: Secondary | ICD-10-CM | POA: Diagnosis not present

## 2017-12-04 DIAGNOSIS — E1122 Type 2 diabetes mellitus with diabetic chronic kidney disease: Secondary | ICD-10-CM | POA: Diagnosis not present

## 2017-12-04 DIAGNOSIS — N183 Chronic kidney disease, stage 3 (moderate): Secondary | ICD-10-CM | POA: Diagnosis not present

## 2017-12-05 MED FILL — SUCRALFATE 1 GM TABLET: 1 | 30 days supply | Qty: 120 | Fill #1

## 2017-12-16 MED FILL — DILTIAZEM 24HR ER 180 MG CA: 180 | 30 days supply | Qty: 30 | Fill #0

## 2017-12-16 MED FILL — GABAPENTIN 300 MG CAPSULE: 300 | 30 days supply | Qty: 30 | Fill #2

## 2017-12-16 MED FILL — $LANTUS SOLOSTAR 100 UNITS/: 100 | 30 days supply | Qty: 21 | Fill #3

## 2017-12-16 MED FILL — $HUMALOG 100 UNITS/ML KWIKP: 100 | 30 days supply | Qty: 15 | Fill #2

## 2017-12-16 MED FILL — PANTOPRAZOLE SOD DR 40 MG T: 40 | 30 days supply | Qty: 30 | Fill #1

## 2017-12-21 ENCOUNTER — Encounter (HOSPITAL_COMMUNITY): Payer: Self-pay | Admitting: Nurse Practitioner

## 2017-12-21 ENCOUNTER — Other Ambulatory Visit: Payer: Self-pay

## 2017-12-21 ENCOUNTER — Inpatient Hospital Stay (HOSPITAL_COMMUNITY)
Admission: EM | Admit: 2017-12-21 | Discharge: 2017-12-24 | DRG: 074 | Disposition: A | Discharge status: Home / Self Care | Payer: Medicare Other | Attending: Internal Medicine | Admitting: Internal Medicine

## 2017-12-21 DIAGNOSIS — E1043 Type 1 diabetes mellitus with diabetic autonomic (poly)neuropathy: Secondary | ICD-10-CM | POA: Diagnosis present

## 2017-12-21 DIAGNOSIS — E1140 Type 2 diabetes mellitus with diabetic neuropathy, unspecified: Secondary | ICD-10-CM | POA: Diagnosis present

## 2017-12-21 DIAGNOSIS — F32A Depression, unspecified: Secondary | ICD-10-CM | POA: Diagnosis present

## 2017-12-21 DIAGNOSIS — R1010 Upper abdominal pain, unspecified: Secondary | ICD-10-CM | POA: Diagnosis not present

## 2017-12-21 DIAGNOSIS — R600 Localized edema: Secondary | ICD-10-CM

## 2017-12-21 DIAGNOSIS — E1022 Type 1 diabetes mellitus with diabetic chronic kidney disease: Secondary | ICD-10-CM | POA: Diagnosis present

## 2017-12-21 DIAGNOSIS — Z23 Encounter for immunization: Secondary | ICD-10-CM | POA: Diagnosis not present

## 2017-12-21 DIAGNOSIS — N179 Acute kidney failure, unspecified: Secondary | ICD-10-CM | POA: Diagnosis present

## 2017-12-21 DIAGNOSIS — Z6841 Body Mass Index (BMI) 40.0 and over, adult: Secondary | ICD-10-CM | POA: Diagnosis not present

## 2017-12-21 DIAGNOSIS — E1143 Type 2 diabetes mellitus with diabetic autonomic (poly)neuropathy: Secondary | ICD-10-CM | POA: Diagnosis not present

## 2017-12-21 DIAGNOSIS — E1122 Type 2 diabetes mellitus with diabetic chronic kidney disease: Secondary | ICD-10-CM

## 2017-12-21 DIAGNOSIS — Z794 Long term (current) use of insulin: Secondary | ICD-10-CM

## 2017-12-21 DIAGNOSIS — Z79899 Other long term (current) drug therapy: Secondary | ICD-10-CM

## 2017-12-21 DIAGNOSIS — K3184 Gastroparesis: Secondary | ICD-10-CM | POA: Diagnosis not present

## 2017-12-21 DIAGNOSIS — E103599 Type 1 diabetes mellitus with proliferative diabetic retinopathy without macular edema, unspecified eye: Secondary | ICD-10-CM | POA: Diagnosis present

## 2017-12-21 DIAGNOSIS — N289 Disorder of kidney and ureter, unspecified: Secondary | ICD-10-CM | POA: Diagnosis not present

## 2017-12-21 DIAGNOSIS — I10 Essential (primary) hypertension: Secondary | ICD-10-CM | POA: Diagnosis present

## 2017-12-21 DIAGNOSIS — Z833 Family history of diabetes mellitus: Secondary | ICD-10-CM

## 2017-12-21 DIAGNOSIS — K219 Gastro-esophageal reflux disease without esophagitis: Secondary | ICD-10-CM | POA: Diagnosis present

## 2017-12-21 DIAGNOSIS — M7989 Other specified soft tissue disorders: Secondary | ICD-10-CM | POA: Diagnosis present

## 2017-12-21 DIAGNOSIS — D649 Anemia, unspecified: Secondary | ICD-10-CM | POA: Diagnosis not present

## 2017-12-21 DIAGNOSIS — Z9049 Acquired absence of other specified parts of digestive tract: Secondary | ICD-10-CM

## 2017-12-21 DIAGNOSIS — E785 Hyperlipidemia, unspecified: Secondary | ICD-10-CM | POA: Diagnosis present

## 2017-12-21 DIAGNOSIS — E1065 Type 1 diabetes mellitus with hyperglycemia: Secondary | ICD-10-CM | POA: Diagnosis present

## 2017-12-21 DIAGNOSIS — I129 Hypertensive chronic kidney disease with stage 1 through stage 4 chronic kidney disease, or unspecified chronic kidney disease: Secondary | ICD-10-CM | POA: Diagnosis present

## 2017-12-21 DIAGNOSIS — F329 Major depressive disorder, single episode, unspecified: Secondary | ICD-10-CM | POA: Diagnosis present

## 2017-12-21 DIAGNOSIS — N183 Chronic kidney disease, stage 3 unspecified: Secondary | ICD-10-CM

## 2017-12-21 DIAGNOSIS — E109 Type 1 diabetes mellitus without complications: Secondary | ICD-10-CM | POA: Diagnosis present

## 2017-12-21 LAB — GLUCOSE, CAPILLARY
Glucose-Capillary: 228 mg/dL — ABNORMAL HIGH (ref 70–99)
Glucose-Capillary: 148 mg/dL — ABNORMAL HIGH (ref 70–99)
Glucose-Capillary: 122 mg/dL — ABNORMAL HIGH (ref 70–99)

## 2017-12-21 LAB — COMPREHENSIVE METABOLIC PANEL
ALT: 26 U/L (ref 0–44)
AST: 23 U/L (ref 15–41)
Albumin: 3.5 g/dL (ref 3.5–5.0)
Alkaline Phosphatase: 81 U/L (ref 38–126)
Anion gap: 11 (ref 5–15)
BUN: 25 mg/dL — ABNORMAL HIGH (ref 6–20)
CHLORIDE: 105 mmol/L (ref 98–111)
CO2: 23 mmol/L (ref 22–32)
Calcium: 8.7 mg/dL — ABNORMAL LOW (ref 8.9–10.3)
Creatinine, Ser: 1.93 mg/dL — ABNORMAL HIGH (ref 0.61–1.24)
GFR, EST AFRICAN AMERICAN: 48 mL/min — AB (ref >60)
GFR, EST NON AFRICAN AMERICAN: 41 mL/min — AB (ref >60)
Glucose, Bld: 333 mg/dL — ABNORMAL HIGH (ref 70–99)
POTASSIUM: 4.2 mmol/L (ref 3.5–5.1)
Sodium: 139 mmol/L (ref 135–145)
Total Bilirubin: 0.7 mg/dL (ref 0.3–1.2)
Total Protein: 7.5 g/dL (ref 6.5–8.1)

## 2017-12-21 LAB — URINALYSIS, ROUTINE W REFLEX MICROSCOPIC
BILIRUBIN URINE: NEGATIVE
Glucose, UA: >=500 mg/dL — AB
Ketones, ur: 5 mg/dL — AB
LEUKOCYTES UA: NEGATIVE
NITRITE: NEGATIVE
Specific Gravity, Urine: 1.022 (ref 1.005–1.030)
pH: 5 (ref 5.0–8.0)

## 2017-12-21 LAB — CBG MONITORING, ED
GLUCOSE-CAPILLARY: 261 mg/dL — AB (ref 70–99)
Glucose-Capillary: 227 mg/dL — ABNORMAL HIGH (ref 70–99)

## 2017-12-21 LAB — CBC
HEMATOCRIT: 38.3 % — AB (ref 39.0–52.0)
HEMOGLOBIN: 12.1 g/dL — AB (ref 13.0–17.0)
MCH: 25.8 pg — ABNORMAL LOW (ref 26.0–34.0)
MCHC: 31.6 g/dL (ref 30.0–36.0)
MCV: 81.7 fL (ref 80.0–100.0)
Platelets: 320 10*3/uL (ref 150–400)
RBC: 4.69 MIL/uL (ref 4.22–5.81)
RDW: 12.3 % (ref 11.5–15.5)
WBC: 9.2 10*3/uL (ref 4.0–10.5)
nRBC: 0 % (ref 0.0–0.2)

## 2017-12-21 LAB — LIPASE, BLOOD: LIPASE: 21 U/L (ref 11–51)

## 2017-12-21 MED ORDER — HYDROMORPHONE HCL 2 MG/ML IJ SOLN
2.0000 mg | Freq: Once | INTRAMUSCULAR | Status: AC
  Administered 2017-12-21: 2 mg via INTRAMUSCULAR
  Filled 2017-12-21: qty 1

## 2017-12-21 MED ORDER — PROMETHAZINE HCL 25 MG/ML IJ SOLN
12.5000 mg | Freq: Once | INTRAMUSCULAR | Status: DC
  Filled 2017-12-21: qty 1

## 2017-12-21 MED ORDER — DILTIAZEM HCL ER COATED BEADS 180 MG PO CP24
180.0000 mg | ORAL_CAPSULE | Freq: Every day | ORAL | Status: DC
  Administered 2017-12-21 – 2017-12-24 (×4): 180 mg via ORAL
  Filled 2017-12-21 (×4): qty 1

## 2017-12-21 MED ORDER — SUCRALFATE 1 G PO TABS
1.0000 g | ORAL_TABLET | Freq: Four times a day (QID) | ORAL | Status: DC
  Administered 2017-12-21 – 2017-12-24 (×13): 1 g via ORAL
  Filled 2017-12-21 (×14): qty 1

## 2017-12-21 MED ORDER — HYDROMORPHONE HCL 1 MG/ML IJ SOLN
1.0000 mg | Freq: Once | INTRAMUSCULAR | Status: AC
  Administered 2017-12-21: 1 mg via INTRAVENOUS
  Filled 2017-12-21: qty 1

## 2017-12-21 MED ORDER — AMLODIPINE BESYLATE 5 MG PO TABS
10.0000 mg | ORAL_TABLET | Freq: Every day | ORAL | Status: DC
  Filled 2017-12-21: qty 2

## 2017-12-21 MED ORDER — INSULIN ASPART 100 UNIT/ML ~~LOC~~ SOLN
0.0000 [IU] | Freq: Three times a day (TID) | SUBCUTANEOUS | Status: DC
  Administered 2017-12-21: 3 [IU] via SUBCUTANEOUS
  Administered 2017-12-21: 7 [IU] via SUBCUTANEOUS
  Administered 2017-12-22 – 2017-12-23 (×3): 3 [IU] via SUBCUTANEOUS
  Administered 2017-12-23: 7 [IU] via SUBCUTANEOUS
  Administered 2017-12-24: 4 [IU] via SUBCUTANEOUS

## 2017-12-21 MED ORDER — METOPROLOL TARTRATE 25 MG PO TABS
50.0000 mg | ORAL_TABLET | Freq: Two times a day (BID) | ORAL | Status: DC
  Administered 2017-12-21 – 2017-12-24 (×7): 50 mg via ORAL
  Filled 2017-12-21 (×7): qty 2

## 2017-12-21 MED ORDER — PROMETHAZINE HCL 25 MG/ML IJ SOLN
12.5000 mg | Freq: Once | INTRAMUSCULAR | Status: AC
  Administered 2017-12-21: 12.5 mg via INTRAVENOUS
  Filled 2017-12-21: qty 1

## 2017-12-21 MED ORDER — SODIUM CHLORIDE 0.9 % IV BOLUS
1000.0000 mL | Freq: Once | INTRAVENOUS | Status: AC
  Administered 2017-12-21: 1000 mL via INTRAVENOUS

## 2017-12-21 MED ORDER — PANTOPRAZOLE SODIUM 40 MG IV SOLR
40.0000 mg | INTRAVENOUS | Status: DC
  Administered 2017-12-21 – 2017-12-23 (×3): 40 mg via INTRAVENOUS
  Filled 2017-12-21 (×3): qty 40

## 2017-12-21 MED ORDER — METOCLOPRAMIDE HCL 5 MG/ML IJ SOLN
10.0000 mg | Freq: Four times a day (QID) | INTRAMUSCULAR | Status: DC
  Administered 2017-12-21 – 2017-12-23 (×8): 10 mg via INTRAVENOUS
  Filled 2017-12-21 (×11): qty 2

## 2017-12-21 MED ORDER — ATORVASTATIN CALCIUM 10 MG PO TABS
20.0000 mg | ORAL_TABLET | Freq: Every day | ORAL | Status: DC
  Administered 2017-12-21 – 2017-12-23 (×3): 20 mg via ORAL
  Filled 2017-12-21 (×3): qty 2

## 2017-12-21 MED ORDER — PROMETHAZINE HCL 25 MG/ML IJ SOLN
12.5000 mg | Freq: Four times a day (QID) | INTRAMUSCULAR | Status: DC | PRN
  Administered 2017-12-21 – 2017-12-23 (×7): 12.5 mg via INTRAVENOUS
  Filled 2017-12-21 (×9): qty 1

## 2017-12-21 MED ORDER — HYDROMORPHONE HCL 1 MG/ML IJ SOLN
1.0000 mg | Freq: Four times a day (QID) | INTRAMUSCULAR | Status: DC | PRN
  Administered 2017-12-21 – 2017-12-24 (×9): 1 mg via INTRAVENOUS
  Filled 2017-12-21 (×9): qty 1

## 2017-12-21 MED ORDER — INSULIN ASPART 100 UNIT/ML ~~LOC~~ SOLN: 0.0000 [IU] | Freq: Every day | SUBCUTANEOUS | Status: DC

## 2017-12-21 MED ORDER — ENOXAPARIN SODIUM 40 MG/0.4ML ~~LOC~~ SOLN: 40.0000 mg | SUBCUTANEOUS | Status: DC

## 2017-12-21 MED ORDER — ATORVASTATIN CALCIUM 20 MG PO TABS
20.0000 mg | ORAL_TABLET | Freq: Every day | ORAL | Status: DC
  Filled 2017-12-21: qty 1

## 2017-12-21 MED ORDER — CHLORPROMAZINE HCL 25 MG PO TABS
25.0000 mg | ORAL_TABLET | Freq: Three times a day (TID) | ORAL | Status: DC | PRN
  Filled 2017-12-21 (×2): qty 1

## 2017-12-21 MED ORDER — INFLUENZA VAC SPLIT QUAD 0.5 ML IM SUSY
0.5000 mL | PREFILLED_SYRINGE | INTRAMUSCULAR | Status: AC
  Administered 2017-12-23: 0.5 mL via INTRAMUSCULAR
  Filled 2017-12-21: qty 0.5

## 2017-12-21 MED ORDER — PROMETHAZINE HCL 25 MG/ML IJ SOLN
12.5000 mg | Freq: Once | INTRAMUSCULAR | Status: AC
  Administered 2017-12-21: 12.5 mg via INTRAMUSCULAR
  Filled 2017-12-21: qty 1

## 2017-12-21 MED ORDER — LISINOPRIL 20 MG PO TABS
40.0000 mg | ORAL_TABLET | Freq: Every day | ORAL | Status: DC
  Administered 2017-12-21 – 2017-12-23 (×3): 40 mg via ORAL
  Filled 2017-12-21 (×3): qty 2

## 2017-12-21 MED ORDER — GABAPENTIN 300 MG PO CAPS
300.0000 mg | ORAL_CAPSULE | Freq: Every day | ORAL | Status: DC
  Administered 2017-12-21 – 2017-12-23 (×3): 300 mg via ORAL
  Filled 2017-12-21 (×3): qty 1

## 2017-12-21 MED ORDER — INSULIN GLARGINE 100 UNIT/ML ~~LOC~~ SOLN
35.0000 [IU] | Freq: Two times a day (BID) | SUBCUTANEOUS | Status: DC
  Administered 2017-12-21 – 2017-12-24 (×7): 35 [IU] via SUBCUTANEOUS
  Filled 2017-12-21 (×9): qty 0.35

## 2017-12-21 MED ORDER — SODIUM CHLORIDE 0.9 % IV SOLN
INTRAVENOUS | Status: DC
  Administered 2017-12-21 – 2017-12-24 (×8): via INTRAVENOUS

## 2017-12-21 MED ORDER — HEPARIN SODIUM (PORCINE) 5000 UNIT/ML IJ SOLN
5000.0000 [IU] | Freq: Three times a day (TID) | INTRAMUSCULAR | Status: DC
  Administered 2017-12-21 – 2017-12-24 (×9): 5000 [IU] via SUBCUTANEOUS
  Filled 2017-12-21 (×9): qty 1

## 2017-12-21 NOTE — ED Notes (Addendum)
Patient's IV was infiltrated. Given a hot pack to decrease swelling.

## 2017-12-21 NOTE — ED Notes (Signed)
ED TO INPATIENT HANDOFF REPORT  Name/Age/Gender Duane [name] 42 y.o. male  Code Status    Code Status Orders  (From admission, onward)         Start     Ordered   12/21/17 0858  Full code  Continuous     12/21/17 0857        Code Status History    Date Active Date Inactive Code Status Order ID Comments User Context   10/16/2015 0142 10/17/2015 1904 Full Code [id number]  Norval Morton, MD ED   01/01/2015 0554 01/05/2015 2043 Full Code [id number]  Etta Quill, DO ED   06/27/2014 0910 07/01/2014 2217 Full Code [id number]  Thurnell Lose, MD Inpatient   05/11/2014 1116 05/24/2014 2005 Full Code [id number]  [name], MD Inpatient   05/05/2014 0545 05/06/2014 1856 Full Code [id number]  Ivor Costa, MD Inpatient   04/30/2014 0705 05/03/2014 1632 Full Code [id number]  Lavina Hamman, MD ED   02/16/2014 0528 02/18/2014 2031 Full Code [id number]  Etta Quill, DO ED   02/03/2014 1004 02/05/2014 2130 Full Code [id number]  Thurnell Lose, MD Inpatient   12/31/2013 0341 01/10/2014 1714 Full Code [id number]  Berle Mull, MD Inpatient   10/14/2013 1603 10/19/2013 1651 Full Code [id number]  [name], MD ED   09/22/2013 0302 10/06/2013 2017 Full Code [id number]  Gould, Emerson, DO Inpatient   08/27/2013 1532 08/29/2013 1717 Full Code [id number]  Louellen Molder, MD Inpatient   08/16/2013 2146 08/19/2013 1929 Full Code [id number]  Theressa Millard, MD ED   06/04/2013 1750 06/12/2013 2019 Full Code [id number]  Eugenie Filler, MD Inpatient   05/30/2013 2223 06/02/2013 1749 Full Code [id number]  Merton Border, MD Inpatient   01/09/2013 0450 01/10/2013 0014 Full Code [id number]  Etta Quill, DO Inpatient      Home/SNF/Other Home  Chief Complaint Emesis; abd pain  Level of Care/Admitting Diagnosis ED Disposition    ED Disposition Condition [facility] Area: [facility] [[id number]]  Level of Care: Med-Surg [16]  Diagnosis: Gastroparesis [536.3.ICD-9-CM]  Admitting Physician: Shelly Coss [[id number]]  Attending Physician: Shelly Coss [[id number]]  PT Class (Do Not Modify): Observation [104]  PT Acc Code (Do Not Modify): Observation [10022]       Medical History Past Medical History:  Diagnosis Date  . Depression   . Diabetes mellitus without complication [facility]) age 64   insulin requiring from the start  . Diabetic neuropathy ([place]) Dx 20110  . Diabetic proliferative retinopathy ([place]) 08/02/2017  . Gastritis   . Gastroparesis 2012   100% gastric retention on 08/2013 [facility]  . GERD (gastroesophageal reflux disease) Dx 2012  . Hypertension Dx 2012  . Obesity     Allergies No Known Allergies  IV Location/Drains/Wounds Patient Lines/Drains/Airways Status   Active Line/Drains/Airways    Name:   Placement date:   Placement time:   Site:   Days:   Peripheral IV 12/21/17 Left Hand   12/21/17    0915    Hand   less than 1          Labs/Imaging Results for orders placed or performed during the hospital encounter of 12/21/17 (from the past 48 hour(s))  CBG monitoring, ED     Status: Abnormal   Collection Time: 12/21/17  1:03 AM  Result Value Ref Range   Glucose-Capillary 261 (H) 70 - 99 mg/dL  Lipase, blood     Status: None   Collection Time:  12/21/17  2:53 AM  Result Value Ref Range   Lipase 21 11 - 51 U/L    Comment: Performed at [facility]: [place] [address], [place] [address], [place], [place] [address]  Comprehensive metabolic panel     Status: Abnormal   Collection Time: 12/21/17  2:53 AM  Result Value Ref Range   Sodium 139 135 - 145 mmol/L   Potassium 4.2 3.5 - 5.1 mmol/L   Chloride 105 98 - 111 mmol/L   CO2 23 22 - 32 mmol/L   Glucose, Bld 333 (H) 70 - 99 mg/dL   BUN 25 (H) 6 - 20 mg/dL   Creatinine, Ser 1.93 (H) 0.61 - 1.24 mg/dL   Calcium 8.7 (L) 8.9 - 10.3 mg/dL   Total Protein 7.5 6.5 - 8.1 g/dL   Albumin 3.5 3.5 - 5.0 g/dL   AST 23 15 - 41 U/L   ALT 26 0 - 44 U/L   Alkaline Phosphatase 81 38 - 126 U/L    Total Bilirubin 0.7 0.3 - 1.2 mg/dL   GFR calc non Af Amer 41 (L) >60 mL/min   GFR calc Af Amer 48 (L) >60 mL/min    Comment: (NOTE) The eGFR has been calculated using the CKD EPI equation. This calculation has not been validated in all clinical situations. eGFR's persistently <60 mL/min signify possible Chronic Kidney Disease.    Anion gap 11 5 - 15    Comment: Performed at [facility], [place] [address], [place], [place] [address]  CBC     Status: Abnormal   Collection Time: 12/21/17  2:53 AM  Result Value Ref Range   WBC 9.2 4.0 - 10.5 K/uL   RBC 4.69 4.22 - 5.81 MIL/uL   Hemoglobin 12.1 (L) 13.0 - 17.0 g/dL   HCT 38.3 (L) 39.0 - 52.0 %   MCV 81.7 80.0 - 100.0 fL   MCH 25.8 (L) 26.0 - 34.0 pg   MCHC 31.6 30.0 - 36.0 g/dL   RDW 12.3 11.5 - 15.5 %   Platelets 320 150 - 400 K/uL   nRBC 0.0 0.0 - 0.2 %    Comment: Performed at [facility], [place] [address], [place], [place] [address]  Urinalysis, Routine w reflex microscopic     Status: Abnormal   Collection Time: 12/21/17  2:53 AM  Result Value Ref Range   Color, Urine YELLOW YELLOW   APPearance CLEAR CLEAR   Specific Gravity, Urine 1.022 1.005 - 1.030   pH 5.0 5.0 - 8.0   Glucose, UA >=500 (A) NEGATIVE mg/dL   Hgb urine dipstick MODERATE (A) NEGATIVE   Bilirubin Urine NEGATIVE NEGATIVE   Ketones, ur 5 (A) NEGATIVE mg/dL   Protein, ur >=300 (A) NEGATIVE mg/dL   Nitrite NEGATIVE NEGATIVE   Leukocytes, UA NEGATIVE NEGATIVE   RBC / HPF 0-5 0 - 5 RBC/hpf   WBC, UA 0-5 0 - 5 WBC/hpf   Bacteria, UA RARE (A) NONE SEEN   Squamous Epithelial / LPF 0-5 0 - 5   Budding Yeast PRESENT    Hyaline Casts, UA PRESENT     Comment: Performed at [facility], [place] [address], [place], [place] [address]   No results found. None  Pending Labs Unresulted Labs (From admission, onward)    Start     Ordered   12/22/17 0814  Basic metabolic panel  Tomorrow morning,   R    Question:   Specimen collection method  Answer:  IV Team=IV  Team collect   12/21/17 0857   12/22/17 0500  CBC  Tomorrow morning,   R    Question:  Specimen collection method  Answer:  IV Team=IV Team collect   12/21/17 0857   12/21/17 0855  HIV antibody (Routine Testing)  Once,   R    Question:  Specimen collection method  Answer:  IV Team=IV Team collect   12/21/17 0857          Vitals/Pain Today's Vitals   12/21/17 0642 12/21/17 0700 12/21/17 0800 12/21/17 0926  BP:  (!) 182/102 (!) 148/86   Pulse:  99 94   Resp:      Temp:      TempSrc:      SpO2:  92% 90%   Weight:      Height:      PainSc: 9    7     Isolation Precautions No active isolations  Medications Medications  0.9 %  sodium chloride infusion ( Intravenous Transfusing/Transfer 12/21/17 0932)  metoCLOPramide (REGLAN) injection 10 mg (has no administration in time range)  HYDROmorphone (DILAUDID) injection 1 mg (1 mg Intravenous Given 12/21/17 0926)  promethazine (PHENERGAN) injection 12.5 mg (12.5 mg Intravenous Given 12/21/17 0925)  amLODipine (NORVASC) tablet 10 mg (has no administration in time range)  atorvastatin (LIPITOR) tablet 20 mg (has no administration in time range)  diltiazem (CARDIZEM CD) 24 hr capsule 180 mg (has no administration in time range)  lisinopril (PRINIVIL,ZESTRIL) tablet 40 mg (has no administration in time range)  metoprolol tartrate (LOPRESSOR) tablet 50 mg (has no administration in time range)  sucralfate (CARAFATE) tablet 1 g (has no administration in time range)  gabapentin (NEURONTIN) capsule 300 mg (has no administration in time range)  enoxaparin (LOVENOX) injection 40 mg (has no administration in time range)  HYDROmorphone (DILAUDID) injection 1 mg (1 mg Intravenous Given 12/21/17 0452)  sodium chloride 0.9 % bolus 1,000 mL (0 mLs Intravenous Stopped 12/21/17 0535)  promethazine (PHENERGAN) injection 12.5 mg (12.5 mg Intravenous Given 12/21/17 0452)  HYDROmorphone (DILAUDID) injection 2 mg  (2 mg Intramuscular Given 12/21/17 0643)  promethazine (PHENERGAN) injection 12.5 mg (12.5 mg Intramuscular Given 12/21/17 0644)    Mobility walks

## 2017-12-21 NOTE — ED Triage Notes (Signed)
Pt is presenting with c/o abdominal pain, nausea vomiting relating to hx of gastroparesis.

## 2017-12-21 NOTE — H&P (Signed)
History and Physical    Duane [name] WYO:[id number] DOB: 1976-01-14 DOA: 12/21/2017  PCP: Charlott Rakes, MD   Patient coming from: Home  Chief Complaint: Abdominal pain, nausea, vomiting  HPI: Duane [name] is a 42 y.o. male with medical history significant of diabetes type 1, diabetic gastroparesis, diabetic neuropathy and retinopathy, GERD who presents from home to the emergency department today with complaints of intractable nausea, vomiting and abdominal pain which became more severe since yesterday.  Patient was feeling sick from last Tuesday.  He was throwing up.  He thought that it is his gastroparesis flareup.  Nausea, vomiting abdomen pain ultimately subsided on Thursday and he started to eat again.  Since yesterday he had sudden onset of increased abdominal pain and that was pretty bad. He vomited several times all day yesterday.  This was his first gastroparesis flair after a while. Patient has history of uncontrolled diabetes type 1.  He follows with endocrinology Dr. Dwyane Dee and he is on insulin at home. Patient seen and examined at bedside in the emergency department.  He was noted to be slightly hypertensive but otherwise hemodynamically stable.  He will still nauseated and vomited during my evaluation.  He denied any chest pain, shortness of breath no palpitations, fever, chills, dysuria, diarrhea, blurry vision, and hematochezia.  ED Course: Pain management done with IV Dilaudid.  Also given IV Phenergan.    Past Medical History:  Diagnosis Date  . Depression   . Diabetes mellitus without complication [facility]) age 7   insulin requiring from the start  . Diabetic neuropathy ([place]) Dx 201[address]  . Diabetic proliferative retinopathy ([name]) 08/02/2017  . Gastritis   . Gastroparesis 2012   100% gastric retention on 08/2013 GES  . GERD (gastroesophageal reflux disease) Dx 2012  . Hypertension Dx 2012  . Obesity     Past Surgical History:  Procedure Laterality Date  .  CHOLECYSTECTOMY  2013  . ESOPHAGOGASTRODUODENOSCOPY N/A 06/11/2013   Procedure: ESOPHAGOGASTRODUODENOSCOPY (EGD);  Surgeon: [name], MD;  Location: [facility];  Service: Endoscopy;  Laterality: N/A;  . EYE SURGERY    . FLEXIBLE SIGMOIDOSCOPY N/A 06/11/2013   Procedure: FLEXIBLE SIGMOIDOSCOPY;  Surgeon: [name], MD;  Location: [facility];  Service: Endoscopy;  Laterality: N/A;  . KNEE SURGERY Left      reports that he has never smoked. He has never used smokeless tobacco. He reports that he does not drink alcohol or use drugs.  No Known Allergies  Family History  Problem Relation Age of Onset  . Diabetes Father   . Diabetes Maternal Grandmother   . Diabetes Paternal Grandmother   . Colon cancer Neg Hx   . Stomach cancer Neg Hx      Prior to Admission medications   Medication Sig Start Date End Date Taking? Authorizing Provider  aluminum chloride (DRYSOL) 20 % external solution Apply topically at bedtime. 05/17/17  Yes Charlott Rakes, MD  amLODipine (NORVASC) 5 MG tablet Take by mouth. 10/10/12  Yes [provider]  atorvastatin (LIPITOR) 20 MG tablet Take 1 tablet (20 mg total) by mouth daily. 11/20/17  Yes Charlott Rakes, MD  diltiazem (CARTIA XT) 180 MG 24 hr capsule Take 1 capsule (180 mg total) by mouth daily. 11/20/17  Yes Charlott Rakes, MD  furosemide (LASIX) 40 MG tablet Take 1 tablet (40 mg total) by mouth daily. 11/20/17  Yes Charlott Rakes, MD  gabapentin (NEURONTIN) 300 MG capsule Take 1 capsule (300 mg total) by mouth at bedtime. 11/20/17  Yes Charlott Rakes, MD  Insulin Glargine (LANTUS SOLOSTAR) 100 UNIT/ML Solostar Pen Inject 35 Units into the skin 2 (two) times daily. Patient taking differently: Inject 70 Units into the skin at bedtime.  08/20/17  Yes Newlin, Enobong, MD  insulin lispro (HUMALOG KWIKPEN) 100 UNIT/ML KiwkPen Inject 0.15 mLs (15 Units total) into the skin 3 (three) times daily. 01/16/17  Yes Charlott Rakes, MD  lisinopril  (PRINIVIL,ZESTRIL) 40 MG tablet Take 40 mg by mouth daily.  05/09/13  Yes [provider]  metoCLOPramide (REGLAN) 10 MG tablet TAKE 1 TABLET BY MOUTH 3 TIMES DAILY BEFORE MEALS AS NEEDED Patient taking differently: Take 10 mg by mouth every 8 (eight) hours as needed for nausea. TAKE 1 TABLET BY MOUTH 3 TIMES DAILY BEFORE MEALS AS NEEDED 08/21/17  Yes Charlott Rakes, MD  metoprolol tartrate (LOPRESSOR) 50 MG tablet Take 50 mg by mouth 2 (two) times daily.    Yes [provider]  pantoprazole (PROTONIX) 40 MG tablet Take 1 tablet (40 mg total) by mouth daily. 05/17/17  Yes Newlin, Charlane Ferretti, MD  potassium chloride (MICRO-K) 10 MEQ CR capsule TAKE 1 TABLET BY MOUTH DAILY. Patient taking differently: Take 10 mEq by mouth daily.  07/17/17  Yes Newlin, Charlane Ferretti, MD  promethazine (PHENERGAN) 25 MG tablet TAKE 1 TABLET BY MOUTH EVERY 8 HOURS AS NEEDED FOR NAUSEA OR VOMITING. Patient taking differently: Take 25 mg by mouth every 6 (six) hours as needed for nausea or vomiting.  04/23/16  Yes Funches, Josalyn, MD  sucralfate (CARAFATE) 1 g tablet TAKE 1 TABLET BY MOUTH 4 TIMES DAILY WITH MEALS AND AT BEDTIME. Patient taking differently: Take 1 g by mouth 4 (four) times daily.  10/18/17  Yes Charlott Rakes, MD  tadalafil 2.5 MG TABS Take 1 tablet (2.5 mg total) by mouth daily. 12/23/15  Yes Funches, Josalyn, MD  Blood Glucose Monitoring Suppl (TRUE METRIX METER) W/DEVICE KIT 1 each by Does not apply route 4 (four) times daily -  before meals and at bedtime. 11/05/14   Funches, Adriana Mccallum, MD  glucose blood (CONTOUR NEXT TEST) test strip Use as instructed 10/11/17   Elayne Snare, MD  glucose blood (TRUE METRIX BLOOD GLUCOSE TEST) test strip 1 each by Other route 3 (three) times daily. 08/20/16   Funches, Adriana Mccallum, MD  Insulin Pen Needle 31G X 8 MM MISC Inject 1 each into the skin 4 (four) times daily. Check blood sugar TID and QHS 06/15/16   Funches, Josalyn, MD  Peppermint Oil (IBGARD) 90 MG CPCR Take 2 capsules  by mouth 3 (three) times daily as needed. 10/11/17   [name], MD  TRUEPLUS LANCETS 28G MISC 1 each by Does not apply route 4 (four) times daily -  before meals and at bedtime. 11/04/14   Boykin Nearing, MD    Physical Exam: Vitals:   12/21/17 0600 12/21/17 0700 12/21/17 0800 12/21/17 0900  BP: (!) 191/95 (!) 182/102 (!) 148/86 (!) 141/86  Pulse: 100 99 94 90  Resp: 19     Temp:    98.2 F (36.8 C)  TempSrc:    Oral  SpO2: 96% 92% 90% 92%  Weight:      Height:        Constitutional: Generalized weakness, obese Vitals:   12/21/17 0600 12/21/17 0700 12/21/17 0800 12/21/17 0900  BP: (!) 191/95 (!) 182/102 (!) 148/86 (!) 141/86  Pulse: 100 99 94 90  Resp: 19     Temp:    98.2 F (36.8 C)  TempSrc:    Oral  SpO2: 96% 92% 90% 92%  Weight:      Height:       Eyes: PERRL, lids and conjunctivae normal ENMT: Mucous membranes are moist. Posterior pharynx clear of any exudate or lesions.Normal dentition.  Neck: normal, supple, no masses, no thyromegaly Respiratory: clear to auscultation bilaterally, no wheezing, no crackles. Normal respiratory effort. No accessory muscle use.  Cardiovascular: Regular rate and rhythm, no murmurs / rubs / gallops. No extremity edema. 2+ pedal pulses. No carotid bruits.  Abdomen: no tenderness, no masses palpated. No hepatosplenomegaly. Bowel sounds positive.  Musculoskeletal: no clubbing / cyanosis. No joint deformity upper and lower extremities. Good ROM, no contractures. Normal muscle tone.  Skin: no rashes, lesions, ulcers. No induration Neurologic: CN 2-12 grossly intact. Sensation intact, DTR normal. Strength 5/5 in all 4.  Psychiatric: Normal judgment and insight. Alert and oriented x 3. Normal mood.   Foley Catheter:None  Labs on Admission: I have personally reviewed following labs and imaging studies  CBC: Recent Labs  Lab 12/21/17 0253  WBC 9.2  HGB 12.1*  HCT 38.3*  MCV 81.7  PLT 195   Basic Metabolic Panel: Recent Labs    Lab 12/21/17 0253  NA 139  K 4.2  CL 105  CO2 23  GLUCOSE 333*  BUN 25*  CREATININE 1.93*  CALCIUM 8.7*   GFR: Estimated Creatinine Clearance: 79.1 mL/min (A) (by C-G formula based on SCr of 1.93 mg/dL (H)). Liver Function Tests: Recent Labs  Lab 12/21/17 0253  AST 23  ALT 26  ALKPHOS 81  BILITOT 0.7  PROT 7.5  ALBUMIN 3.5   Recent Labs  Lab 12/21/17 0253  LIPASE 21   No results for input(s): AMMONIA in the last 168 hours. Coagulation Profile: No results for input(s): INR, PROTIME in the last 168 hours. Cardiac Enzymes: No results for input(s): CKTOTAL, CKMB, CKMBINDEX, TROPONINI in the last 168 hours. BNP (last 3 results) No results for input(s): PROBNP in the last 8760 hours. HbA1C: No results for input(s): HGBA1C in the last 72 hours. CBG: Recent Labs  Lab 12/21/17 0103  GLUCAP 261*   Lipid Profile: No results for input(s): CHOL, HDL, LDLCALC, TRIG, CHOLHDL, LDLDIRECT in the last 72 hours. Thyroid Function Tests: No results for input(s): TSH, T4TOTAL, FREET4, T3FREE, THYROIDAB in the last 72 hours. Anemia Panel: No results for input(s): VITAMINB12, FOLATE, FERRITIN, TIBC, IRON, RETICCTPCT in the last 72 hours. Urine analysis:    Component Value Date/Time   COLORURINE YELLOW 12/21/2017 0253   APPEARANCEUR CLEAR 12/21/2017 0253   LABSPEC 1.022 12/21/2017 0253   PHURINE 5.0 12/21/2017 0253   GLUCOSEU >=500 (A) 12/21/2017 0253   HGBUR MODERATE (A) 12/21/2017 0253   BILIRUBINUR NEGATIVE 12/21/2017 0253   BILIRUBINUR negative 06/01/2016 1059   KETONESUR 5 (A) 12/21/2017 0253   PROTEINUR >=300 (A) 12/21/2017 0253   UROBILINOGEN 0.2 06/01/2016 1059   UROBILINOGEN 0.2 06/27/2014 0748   NITRITE NEGATIVE 12/21/2017 0253   LEUKOCYTESUR NEGATIVE 12/21/2017 0253    Radiological Exams on Admission: No results found.   Assessment/Plan Principal Problem:   Diabetic gastroparesis associated with type 1 diabetes mellitus ([place]) Active Problems:   DM type  1 (diabetes mellitus, type 1) (HCC)   HTN (hypertension)   Diabetic neuropathy ([place])   Depression   Gastroparesis   CKD stage 3 secondary to diabetes ([place])  Diabetic gastroparesis: Associated with uncontrolled diabetes.  Continue pain management.  Continue IV Reglan.  Follows with GI as an outpatient.  I have discussed with him about the importance of glycemic control in order to prevent his gastroparesis flare up. I have also advised him to take frequent, small volume, low fatty meals.  Abdominal pain/nausea, vomiting: Continue Phenergan, Reglan.  Continue IV fluids.  Diabetes type 1: Currently uncontrolled.  His hemoglobin A1c was 13 as per 07/2017.  Follows with Dr. Dwyane Dee, endocrinology.  Continue insulin here.  Check hemoglobin A1c level.  CKD stage III: Associated with diabetes.  Currently kidney function is on baseline.  Follows with [facility].  Diabetic neuropathy: Continue gabapentin  Hypertension: Noted to be slightly hypertensive on presentation.  Continue his home medications.  Continue to monitor blood pressure.  Morbid obesity: Advised him for the importance of balanced diet and exercise for control of his diabetes and other overall problems.  GERD: Continue Protonix.  Severity of Illness: The appropriate patient status for this patient is INPATIENT   DVT prophylaxis: Heparin Code Status: Full Family Communication: None present at the bed side Consults called: None     Shelly Coss MD [facility] Pager [phone]  If 7PM-7AM, please contact night-coverage [email or web address] Password [facility]  12/21/2017, 9:49 AM

## 2017-12-21 NOTE — ED Notes (Signed)
Patient states his pain is 10/10. Constant nausea and vomiting. States that he has been in and out of hospitals for this complaint "around 50x this year." Per patient "normally they give me Phenergan and Dilaudid." He has a prescription for Phenergan but did not take any before arrival .

## 2017-12-21 NOTE — ED Provider Notes (Signed)
Portal DEPT Provider Note   CSN: [id number] Arrival date & time: 12/21/17  0045     History   Chief Complaint Chief Complaint  Patient presents with  . Abdominal Pain  . Emesis    HPI Duane [name] is a 42 y.o. male.  The history is provided by the patient.  He has history of hypertension, diabetes complicated by neuropathy, and retinopathy and gastroparesis, GERD and comes in with upper abdominal pain with associated nausea and vomiting with onset about 6 PM.  There is no radiation of pain.  Pain is rated at 10/10.  Nothing makes it better, nothing makes it worse.  This is typical of exacerbations of his gastroparesis.  In the past, he has had his best relief with promethazine and hydromorphone.  He has had chills and sweats but no fever.  He denies any diarrhea.  Past Medical History:  Diagnosis Date  . Depression   . Diabetes mellitus without complication [facility]) age 46   insulin requiring from the start  . Diabetic neuropathy ([place]) Dx 20110  . Diabetic proliferative retinopathy ([place]) 08/02/2017  . Gastritis   . Gastroparesis 2012   100% gastric retention on 08/2013 G[facility]  . GERD (gastroesophageal reflux disease) Dx 2012  . Hypertension Dx 2012  . Obesity     Patient Active Problem List   Diagnosis Date Noted  . Depression 11/20/2017  . Diabetic enteropathy ([place]) likely 10/11/2017  . Diabetic proliferative retinopathy ([place]) 08/02/2017  . Insomnia 05/17/2017  . Pedal edema 10/16/2016  . Chronic kidney disease 10/16/2016  . Vitamin D deficiency 08/21/2016  . Leg swelling 08/21/2016  . Excessive sweating 08/21/2016  . Status post cholecystectomy 10/16/2015  . Onychomycosis of toenail 04/22/2015  . Erectile dysfunction 04/22/2015  . Diabetic gastroparesis associated with type 1 diabetes mellitus ([place]) 01/01/2015  . Skin tag 09/17/2014  . Pain and swelling of left knee 09/13/2014  . Chronic diarrhea 09/13/2014  . Benign paroxysmal  positional vertigo 07/15/2014  . MDD (major depressive disorder), single episode, severe , no psychosis ([place]) 05/19/2014  . GERD (gastroesophageal reflux disease) 05/05/2014  . Type I diabetes mellitus with complication, uncontrolled ([place])   . Diabetic neuropathy ([employer]) 08/28/2013  . DM type 1 (diabetes mellitus, type 1) ([place]) 01/09/2013  . HTN (hypertension) 01/09/2013    Past Surgical History:  Procedure Laterality Date  . CHOLECYSTECTOMY  2013  . ESOPHAGOGASTRODUODENOSCOPY N/A 06/11/2013   Procedure: ESOPHAGOGASTRODUODENOSCOPY (EGD);  Surgeon: [name], MD;  Location: [facility];  Service: Endoscopy;  Laterality: N/A;  . EYE SURGERY    . FLEXIBLE SIGMOIDOSCOPY N/A 06/11/2013   Procedure: FLEXIBLE SIGMOIDOSCOPY;  Surgeon: [name], MD;  Location: [facility];  Service: Endoscopy;  Laterality: N/A;  . KNEE SURGERY Left         Home Medications    Prior to Admission medications   Medication Sig Start Date End Date Taking? Authorizing Provider  aluminum chloride (DRYSOL) 20 % external solution Apply topically at bedtime. 05/17/17  Yes Charlott Rakes, MD  amLODipine (NORVASC) 5 MG tablet Take by mouth. 10/10/12  Yes [provider]  atorvastatin (LIPITOR) 20 MG tablet Take 1 tablet (20 mg total) by mouth daily. 11/20/17  Yes Charlott Rakes, MD  diltiazem (CARTIA XT) 180 MG 24 hr capsule Take 1 capsule (180 mg total) by mouth daily. 11/20/17  Yes Charlott Rakes, MD  furosemide (LASIX) 40 MG tablet Take 1 tablet (40 mg total) by mouth daily. 11/20/17  Yes Newlin,  Enobong, MD  gabapentin (NEURONTIN) 300 MG capsule Take 1 capsule (300 mg total) by mouth at bedtime. 11/20/17  Yes Charlott Rakes, MD  Insulin Glargine (LANTUS SOLOSTAR) 100 UNIT/ML Solostar Pen Inject 35 Units into the skin 2 (two) times daily. Patient taking differently: Inject 70 Units into the skin at bedtime.  08/20/17  Yes Newlin, Enobong, MD  insulin lispro (HUMALOG KWIKPEN) 100 UNIT/ML KiwkPen Inject  0.15 mLs (15 Units total) into the skin 3 (three) times daily. 01/16/17  Yes Charlott Rakes, MD  lisinopril (PRINIVIL,ZESTRIL) 40 MG tablet Take 40 mg by mouth daily.  05/09/13  Yes [provider]  metoCLOPramide (REGLAN) 10 MG tablet TAKE 1 TABLET BY MOUTH 3 TIMES DAILY BEFORE MEALS AS NEEDED Patient taking differently: Take 10 mg by mouth every 8 (eight) hours as needed for nausea. TAKE 1 TABLET BY MOUTH 3 TIMES DAILY BEFORE MEALS AS NEEDED 08/21/17  Yes Charlott Rakes, MD  metoprolol tartrate (LOPRESSOR) 50 MG tablet Take 50 mg by mouth 2 (two) times daily.    Yes [provider]  pantoprazole (PROTONIX) 40 MG tablet Take 1 tablet (40 mg total) by mouth daily. 05/17/17  Yes Newlin, Charlane Ferretti, MD  potassium chloride (MICRO-K) 10 MEQ CR capsule TAKE 1 TABLET BY MOUTH DAILY. Patient taking differently: Take 10 mEq by mouth daily.  07/17/17  Yes Newlin, Charlane Ferretti, MD  promethazine (PHENERGAN) 25 MG tablet TAKE 1 TABLET BY MOUTH EVERY 8 HOURS AS NEEDED FOR NAUSEA OR VOMITING. Patient taking differently: Take 25 mg by mouth every 6 (six) hours as needed for nausea or vomiting.  04/23/16  Yes Funches, Josalyn, MD  sucralfate (CARAFATE) 1 g tablet TAKE 1 TABLET BY MOUTH 4 TIMES DAILY WITH MEALS AND AT BEDTIME. Patient taking differently: Take 1 g by mouth 4 (four) times daily.  10/18/17  Yes Charlott Rakes, MD  tadalafil 2.5 MG TABS Take 1 tablet (2.5 mg total) by mouth daily. 12/23/15  Yes Funches, Josalyn, MD  terbinafine (LAMISIL) 250 MG tablet Take 1 tablet (250 mg total) by mouth daily. 08/20/17  Yes Charlott Rakes, MD  Blood Glucose Monitoring Suppl (TRUE METRIX METER) W/DEVICE KIT 1 each by Does not apply route 4 (four) times daily -  before meals and at bedtime. 11/05/14   Funches, Adriana Mccallum, MD  cephALEXin (KEFLEX) 500 MG capsule Take 1 capsule (500 mg total) by mouth 2 (two) times daily. Patient not taking: Reported on 12/21/2017 11/20/17   Charlott Rakes, MD  cetirizine (ZYRTEC) 10 MG  tablet Take 1 tablet (10 mg total) by mouth daily. Patient not taking: Reported on 12/21/2017 11/20/17   Charlott Rakes, MD  ergocalciferol (DRISDOL) 50000 units capsule Take 1 capsule (50,000 Units total) by mouth once a week. Patient not taking: Reported on 12/21/2017 05/20/17   Charlott Rakes, MD  FLUoxetine (PROZAC) 20 MG tablet Take 1 tablet (20 mg total) by mouth daily. Patient not taking: Reported on 12/21/2017 11/20/17   Charlott Rakes, MD  fluticasone (FLONASE) 50 MCG/ACT nasal spray Place 2 sprays into both nostrils daily. Patient not taking: Reported on 12/21/2017 11/20/17   Charlott Rakes, MD  glucose blood (CONTOUR NEXT TEST) test strip Use as instructed 10/11/17   Elayne Snare, MD  glucose blood (TRUE METRIX BLOOD GLUCOSE TEST) test strip 1 each by Other route 3 (three) times daily. 08/20/16   Funches, Adriana Mccallum, MD  Insulin Pen Needle 31G X 8 MM MISC Inject 1 each into the skin 4 (four) times daily. Check blood sugar TID and QHS 06/15/16  Funches, Josalyn, MD  lisinopril-hydrochlorothiazide (PRINZIDE,ZESTORETIC) 20-12.5 MG tablet TAKE 2 TABLETS BY MOUTH DAILY. Patient not taking: Reported on 12/21/2017 11/26/17   Charlott Rakes, MD  Peppermint Oil (IBGARD) 90 MG CPCR Take 2 capsules by mouth 3 (three) times daily as needed. 10/11/17   [name], MD  TRUEPLUS LANCETS 28G MISC 1 each by Does not apply route 4 (four) times daily -  before meals and at bedtime. 11/04/14   Boykin Nearing, MD    Family History Family History  Problem Relation Age of Onset  . Diabetes Father   . Diabetes Maternal Grandmother   . Diabetes Paternal Grandmother   . Colon cancer Neg Hx   . Stomach cancer Neg Hx     Social History Social History   Tobacco Use  . Smoking status: Never Smoker  . Smokeless tobacco: Never Used  Substance Use Topics  . Alcohol use: No  . Drug use: No     Allergies   Patient has no known allergies.   Review of Systems Review of Systems  All other systems reviewed  and are negative.    Physical Exam Updated Vital Signs BP (!) 192/110   Pulse (!) 105   Temp 98.1 F (36.7 C) (Oral)   Resp 20   Ht 6' (1.829 m)   Wt (!) 161 kg   SpO2 99%   BMI 48.15 kg/m   Physical Exam  Nursing note and vitals reviewed.  42 year old male, resting comfortably and in no acute distress. Vital signs are significant for elevated heart rate and blood pressure. Oxygen saturation is 99%, which is normal. Head is normocephalic and atraumatic. PERRLA, EOMI. Oropharynx is clear. Neck is nontender and supple without adenopathy or JVD. Back is nontender and there is no CVA tenderness. Lungs are clear without rales, wheezes, or rhonchi. Chest is nontender. Heart has regular rate and rhythm without murmur. Abdomen is soft, flat, with moderate epigastric tenderness.  There is no rebound or guarding.  There are no masses or hepatosplenomegaly and peristalsis is hypoactive. Extremities have 1+ edema, full range of motion is present. Skin is warm and dry without rash. Neurologic: Mental status is normal, cranial nerves are intact, there are no motor or sensory deficits.  ED Treatments / Results  Labs (all labs ordered are listed, but only abnormal results are displayed) Labs Reviewed  COMPREHENSIVE METABOLIC PANEL - Abnormal; Notable for the following components:      Result Value   Glucose, Bld 333 (*)    BUN 25 (*)    Creatinine, Ser 1.93 (*)    Calcium 8.7 (*)    GFR calc non Af Amer 41 (*)    GFR calc Af Amer 48 (*)    All other components within normal limits  CBC - Abnormal; Notable for the following components:   Hemoglobin 12.1 (*)    HCT 38.3 (*)    MCH 25.8 (*)    All other components within normal limits  URINALYSIS, ROUTINE W REFLEX MICROSCOPIC - Abnormal; Notable for the following components:   Glucose, UA >=500 (*)    Hgb urine dipstick MODERATE (*)    Ketones, ur 5 (*)    Protein, ur >=300 (*)    Bacteria, UA RARE (*)    All other components  within normal limits  CBG MONITORING, ED - Abnormal; Notable for the following components:   Glucose-Capillary 261 (*)    All other components within normal limits  LIPASE, BLOOD  Procedures Procedures  Medications Ordered in ED Medications  HYDROmorphone (DILAUDID) injection 1 mg (1 mg Intravenous Given 12/21/17 0452)  sodium chloride 0.9 % bolus 1,000 mL (0 mLs Intravenous Stopped 12/21/17 0535)  promethazine (PHENERGAN) injection 12.5 mg (12.5 mg Intravenous Given 12/21/17 0452)  HYDROmorphone (DILAUDID) injection 2 mg (2 mg Intramuscular Given 12/21/17 0643)  promethazine (PHENERGAN) injection 12.5 mg (12.5 mg Intramuscular Given 12/21/17 0644)     Initial Impression / Assessment and Plan / ED Course  I have reviewed the triage vital signs and the nursing notes.  Pertinent labs & imaging results that were available during my care of the patient were reviewed by me and considered in my medical decision making (see chart for details).  Nausea, vomiting, abdominal pain consistent with exacerbation of gastroparesis.  No red flags to suggest more serious pathology.  Old records are reviewed, and he has had numerous ED visits and hospitalizations for gastroparesis, but most recent was in September 2017.  He is given IV fluids, hydromorphone, promethazine.   He had slight relief with above-noted treatment and is given second dose of hydromorphone and promethazine.  Following above-noted treatment, there was slight additional improvement, but he still does not feel well enough to go home.  We will make arrangements to admit.  Case is discussed with Dr. Tawanna Solo of [facility], who agrees to admit the patient.  Final Clinical Impressions(s) / ED Diagnoses   Final diagnoses:  Gastroparesis diabeticorum ([place])  Renal insufficiency  Normochromic normocytic anemia    ED Discharge Orders    None       Delora Fuel, MD 13/24/40 [phone]

## 2017-12-22 ENCOUNTER — Observation Stay (HOSPITAL_COMMUNITY): Payer: Medicare Other

## 2017-12-22 DIAGNOSIS — Z794 Long term (current) use of insulin: Secondary | ICD-10-CM | POA: Diagnosis not present

## 2017-12-22 DIAGNOSIS — N183 Chronic kidney disease, stage 3 (moderate): Secondary | ICD-10-CM | POA: Diagnosis not present

## 2017-12-22 DIAGNOSIS — E1065 Type 1 diabetes mellitus with hyperglycemia: Secondary | ICD-10-CM | POA: Diagnosis present

## 2017-12-22 DIAGNOSIS — Z23 Encounter for immunization: Secondary | ICD-10-CM | POA: Diagnosis not present

## 2017-12-22 DIAGNOSIS — E1042 Type 1 diabetes mellitus with diabetic polyneuropathy: Secondary | ICD-10-CM

## 2017-12-22 DIAGNOSIS — Z79899 Other long term (current) drug therapy: Secondary | ICD-10-CM | POA: Diagnosis not present

## 2017-12-22 DIAGNOSIS — E1043 Type 1 diabetes mellitus with diabetic autonomic (poly)neuropathy: Principal | ICD-10-CM

## 2017-12-22 DIAGNOSIS — E1122 Type 2 diabetes mellitus with diabetic chronic kidney disease: Secondary | ICD-10-CM

## 2017-12-22 DIAGNOSIS — E785 Hyperlipidemia, unspecified: Secondary | ICD-10-CM | POA: Diagnosis present

## 2017-12-22 DIAGNOSIS — Z833 Family history of diabetes mellitus: Secondary | ICD-10-CM | POA: Diagnosis not present

## 2017-12-22 DIAGNOSIS — D649 Anemia, unspecified: Secondary | ICD-10-CM | POA: Diagnosis present

## 2017-12-22 DIAGNOSIS — K3184 Gastroparesis: Secondary | ICD-10-CM

## 2017-12-22 DIAGNOSIS — E1022 Type 1 diabetes mellitus with diabetic chronic kidney disease: Secondary | ICD-10-CM | POA: Diagnosis present

## 2017-12-22 DIAGNOSIS — F321 Major depressive disorder, single episode, moderate: Secondary | ICD-10-CM | POA: Diagnosis not present

## 2017-12-22 DIAGNOSIS — E103599 Type 1 diabetes mellitus with proliferative diabetic retinopathy without macular edema, unspecified eye: Secondary | ICD-10-CM | POA: Diagnosis present

## 2017-12-22 DIAGNOSIS — Z9049 Acquired absence of other specified parts of digestive tract: Secondary | ICD-10-CM | POA: Diagnosis not present

## 2017-12-22 DIAGNOSIS — I129 Hypertensive chronic kidney disease with stage 1 through stage 4 chronic kidney disease, or unspecified chronic kidney disease: Secondary | ICD-10-CM | POA: Diagnosis present

## 2017-12-22 DIAGNOSIS — I10 Essential (primary) hypertension: Secondary | ICD-10-CM

## 2017-12-22 DIAGNOSIS — K219 Gastro-esophageal reflux disease without esophagitis: Secondary | ICD-10-CM | POA: Diagnosis present

## 2017-12-22 DIAGNOSIS — E0843 Diabetes mellitus due to underlying condition with diabetic autonomic (poly)neuropathy: Secondary | ICD-10-CM | POA: Diagnosis not present

## 2017-12-22 DIAGNOSIS — R1010 Upper abdominal pain, unspecified: Secondary | ICD-10-CM | POA: Diagnosis not present

## 2017-12-22 DIAGNOSIS — M7989 Other specified soft tissue disorders: Secondary | ICD-10-CM | POA: Diagnosis not present

## 2017-12-22 DIAGNOSIS — Z6841 Body Mass Index (BMI) 40.0 and over, adult: Secondary | ICD-10-CM | POA: Diagnosis not present

## 2017-12-22 DIAGNOSIS — F329 Major depressive disorder, single episode, unspecified: Secondary | ICD-10-CM | POA: Diagnosis present

## 2017-12-22 DIAGNOSIS — N179 Acute kidney failure, unspecified: Secondary | ICD-10-CM | POA: Diagnosis present

## 2017-12-22 LAB — BASIC METABOLIC PANEL
ANION GAP: 8 (ref 5–15)
BUN: 36 mg/dL — ABNORMAL HIGH (ref 6–20)
CALCIUM: 7.7 mg/dL — AB (ref 8.9–10.3)
CO2: 23 mmol/L (ref 22–32)
CREATININE: 2.49 mg/dL — AB (ref 0.61–1.24)
Chloride: 109 mmol/L (ref 98–111)
GFR, EST AFRICAN AMERICAN: 35 mL/min — AB (ref >60)
GFR, EST NON AFRICAN AMERICAN: 30 mL/min — AB (ref >60)
Glucose, Bld: 147 mg/dL — ABNORMAL HIGH (ref 70–99)
Potassium: 3.8 mmol/L (ref 3.5–5.1)
SODIUM: 140 mmol/L (ref 135–145)

## 2017-12-22 LAB — CBC
HCT: 35.6 % — ABNORMAL LOW (ref 39.0–52.0)
Hemoglobin: 10.9 g/dL — ABNORMAL LOW (ref 13.0–17.0)
MCH: 26.5 pg (ref 26.0–34.0)
MCHC: 30.6 g/dL (ref 30.0–36.0)
MCV: 86.4 fL (ref 80.0–100.0)
Platelets: 288 10*3/uL (ref 150–400)
RBC: 4.12 MIL/uL — AB (ref 4.22–5.81)
RDW: 12.9 % (ref 11.5–15.5)
WBC: 7.5 10*3/uL (ref 4.0–10.5)
nRBC: 0 % (ref 0.0–0.2)

## 2017-12-22 LAB — GLUCOSE, CAPILLARY
GLUCOSE-CAPILLARY: 113 mg/dL — AB (ref 70–99)
GLUCOSE-CAPILLARY: 130 mg/dL — AB (ref 70–99)
Glucose-Capillary: 128 mg/dL — ABNORMAL HIGH (ref 70–99)
Glucose-Capillary: 98 mg/dL (ref 70–99)
Glucose-Capillary: 141 mg/dL — ABNORMAL HIGH (ref 70–99)

## 2017-12-22 LAB — HEMOGLOBIN A1C
HEMOGLOBIN A1C: 11.8 % — AB (ref 4.8–5.6)
Mean Plasma Glucose: 291.96 mg/dL

## 2017-12-22 NOTE — Progress Notes (Signed)
PROGRESS NOTE    Duane [name]  UKG:[id number] DOB: Jan 07, 1976 DOA: 12/21/2017 PCP: Charlott Rakes, MD   Brief Narrative:  HPI per Dr. Shelly Coss on 12/21/17  Duane [name] is a 42 y.o. male with medical history significant of diabetes type 1, diabetic gastroparesis, diabetic neuropathy and retinopathy, GERD who presents from home to the emergency department today with complaints of intractable nausea, vomiting and abdominal pain which became more severe since yesterday.  Patient was feeling sick from last Tuesday.  He was throwing up.  He thought that it is his gastroparesis flareup.  Nausea, vomiting abdomen pain ultimately subsided on Thursday and he started to eat again.  Since yesterday he had sudden onset of increased abdominal pain and that was pretty bad. He vomited several times all day yesterday.  This was his first gastroparesis flair after a while. Patient has history of uncontrolled diabetes type 1.  He follows with endocrinology Dr. Dwyane Dee and he is on insulin at home. Patient seen and examined at bedside in the emergency department.  He was noted to be slightly hypertensive but otherwise hemodynamically stable.  He will still nauseated and vomited during my evaluation.  He denied any chest pain, shortness of breath no palpitations, fever, chills, dysuria, diarrhea, blurry vision, and hematochezia.  ED Course: Pain management done with IV Dilaudid.  Also given IV Phenergan.  **She feels much better than he did when he came in.  Still not back to baseline but his belly is improved.  No chest pain, lightheadedness or dizziness  Assessment & Plan:   Principal Problem:   Diabetic gastroparesis associated with type 1 diabetes mellitus ([place]) Active Problems:   DM type 1 (diabetes mellitus, type 1) ([place])   HTN (hypertension)   Diabetic neuropathy ([place])   Depression   Gastroparesis   CKD stage 3 secondary to diabetes ([place])  Diabetic Gastroparesis -Associated with Uncontrolled  Diabetes.   -Continue pain management but minimize Narcotics. -Continue IV Reglan.  Follows with GI as an outpatient. -Maintain tight glycemic control -I have also advised him to take frequent, small volume, low fatty meals.  Abdominal pain/nausea, vomiting: -C/w Supportive Care -C/w Metoclopramide 10 mg IV, IVF at 100 mL/hr, Sucralfate 1 gram po 4 times daily, Pantoprazole 40 mg IV, and Promethazine 12.5 mg IV q6hprn  Diabetes type 1 -Currently uncontrolled.   His hemoglobin A1c was 13 as per 07/2017.   -Follows with Dr. Dwyane Dee, endocrinology.   -C/w Insulin Glaragine 35 units sq BID and with Resistant Novolog SSI AC/HS -Checked Hemoglobin A1c level   AKI on CKD stage III -Associated with Diabetes.   -Currently kidney function slightly worsened and BUN/Cr  went from 25/1.93 -> 36/2.49 -Avoid Nephrotoxic Medications if possible -C/w NS at 100 mL/hr -Repeat CMP in AM  Diabetic Neuropathy -Continue Gabapentin 300 mg po qHS  Hypertension:  -Noted to be slightly hypertensive on presentation.  Continue his home medications with lisinopril 40 mg p.o. daily, metoprolol 50 mg p.o. twice daily.   -Continue to monitor blood pressure.  Morbid Obesity -Estimated body mass index is 48.15 kg/m as calculated from the following:   Height as of this encounter: 6' (1.829 m).   Weight as of this encounter: 161 kg. -Weight Loss and Dietary Counseling Gven  GERD -Continue Pantoprazole IV 40 mg q24h.  Bilateral Leg Swelling -Check LE Duplex; Negative for DVT  DVT prophylaxis: Heparin 5,000 units sq q8h Code Status: FULL CODE Family Communication: No family present at bedside  Disposition Plan: Remain Inpatient  for continued workup and treatment   Consultants:   None   Procedures:  LE VENOUS DUPLEX Bilateral lower extremities are negative for deep vein thrombosis. There is no evidence of Baker's cyst bilaterally.   Antimicrobials:  Anti-infectives (From admission, onward)    None     Subjective: Seen and examined at bedside and states that he is feeling a bit better.  No chest pain, lightheadedness or dizziness.  States his legs were swollen more than usual.  No chest pain, lightheadedness or dizziness.  Feels better than yesterday and states his abdomen is minimally hurting today.  Objective: Vitals:   12/21/17 1900 12/21/17 2017 12/21/17 2229 12/22/17 0504  BP: 117/76 108/78 138/80 93/65  Pulse: 75 76 80 68  Resp: 20 18  18   Temp: 98.2 F (36.8 C) 98 F (36.7 C)  98.3 F (36.8 C)  TempSrc: Oral Oral  Oral  SpO2: 95% 96%  93%  Weight:      Height:        Intake/Output Summary (Last 24 hours) at 12/22/2017 1945 Last data filed at 12/22/2017 1800 Gross per 24 hour  Intake 1200 ml  Output -  Net 1200 ml   Filed Weights   12/21/17 0058  Weight: (!) 161 kg   Examination: Physical Exam:  Constitutional: WN/WD morbidly obese AAM in NAD and appears calm and comfortable Eyes: Lids and conjunctivae normal, sclerae anicteric  ENMT: External Ears, Nose appear normal. Grossly normal hearing. Mucous membranes are moist.  Neck: Appears normal, supple, no cervical masses, normal ROM, no appreciable thyromegaly; no JVD Respiratory: Diminished to auscultation bilaterally, no wheezing, rales, rhonchi or crackles. Normal respiratory effort and patient is not tachypenic. No accessory muscle use.  Cardiovascular: RRR, no murmurs / rubs / gallops. S1 and S2 auscultated. 1+ LE extremity edema.  Abdomen: Soft, non-tender, Distended 2/2 to body habitus. No masses palpated. No appreciable hepatosplenomegaly. Bowel sounds positive x4.  GU: Deferred. Musculoskeletal: No clubbing / cyanosis of digits/nails. No joint deformity upper and lower extremities. Normal strength and muscle tone.  Skin: No rashes, lesions, ulcers on a limited skin evaluation. No induration; Warm and dry.  Neurologic: CN 2-12 grossly intact with no focal deficits Romberg sign and cerebellar  reflexes not assessed.  Psychiatric: Normal judgment and insight. Alert and oriented x 3. Normal mood and appropriate affect.   Data Reviewed: I have personally reviewed following labs and imaging studies  CBC: Recent Labs  Lab 12/21/17 0253 12/22/17 0536  WBC 9.2 7.5  HGB 12.1* 10.9*  HCT 38.3* 35.6*  MCV 81.7 86.4  PLT 320 578   Basic Metabolic Panel: Recent Labs  Lab 12/21/17 0253 12/22/17 0536  NA 139 140  K 4.2 3.8  CL 105 109  CO2 23 23  GLUCOSE 333* 147*  BUN 25* 36*  CREATININE 1.93* 2.49*  CALCIUM 8.7* 7.7*   GFR: Estimated Creatinine Clearance: 61.3 mL/min (A) (by C-G formula based on SCr of 2.49 mg/dL (H)). Liver Function Tests: Recent Labs  Lab 12/21/17 0253  AST 23  ALT 26  ALKPHOS 81  BILITOT 0.7  PROT 7.5  ALBUMIN 3.5   Recent Labs  Lab 12/21/17 0253  LIPASE 21   No results for input(s): AMMONIA in the last 168 hours. Coagulation Profile: No results for input(s): INR, PROTIME in the last 168 hours. Cardiac Enzymes: No results for input(s): CKTOTAL, CKMB, CKMBINDEX, TROPONINI in the last 168 hours. BNP (last 3 results) No results for input(s): PROBNP in the last 8760  hours. HbA1C: Recent Labs    12/22/17 0536  HGBA1C 11.8*   CBG: Recent Labs  Lab 12/21/17 1559 12/21/17 2020 12/22/17 0751 12/22/17 1147 12/22/17 1707  GLUCAP 148* 122* 113* 128* 130*   Lipid Profile: No results for input(s): CHOL, HDL, LDLCALC, TRIG, CHOLHDL, LDLDIRECT in the last 72 hours. Thyroid Function Tests: No results for input(s): TSH, T4TOTAL, FREET4, T3FREE, THYROIDAB in the last 72 hours. Anemia Panel: No results for input(s): VITAMINB12, FOLATE, FERRITIN, TIBC, IRON, RETICCTPCT in the last 72 hours. Sepsis Labs: No results for input(s): PROCALCITON, LATICACIDVEN in the last 168 hours.  No results found for this or any previous visit (from the past 240 hour(s)).   Radiology Studies: No results found.   Scheduled Meds: . atorvastatin  20 mg  Oral q1800  . diltiazem  180 mg Oral Daily  . gabapentin  300 mg Oral QHS  . heparin injection (subcutaneous)  5,000 Units Subcutaneous Q8H  . Influenza vac split quadrivalent PF  0.5 mL Intramuscular Tomorrow-1000  . insulin aspart  0-20 Units Subcutaneous TID WC  . insulin aspart  0-5 Units Subcutaneous QHS  . insulin glargine  35 Units Subcutaneous BID  . lisinopril  40 mg Oral Daily  . metoCLOPramide (REGLAN) injection  10 mg Intravenous Q6H  . metoprolol tartrate  50 mg Oral BID  . pantoprazole (PROTONIX) IV  40 mg Intravenous Q24H  . sucralfate  1 g Oral QID   Continuous Infusions: . sodium chloride 100 mL/hr at 12/22/17 1221    LOS: 0 days   Kerney Elbe, DO [facility] PAGER is on [place]  If 7PM-7AM, please contact night-coverage [email or web address] Password [email or web address] 12/22/2017, 7:45 PM

## 2017-12-22 NOTE — Progress Notes (Signed)
Inpatient Diabetes Program Recommendations  AACE/ADA: New Consensus Statement on Inpatient Glycemic Control (2015)  Target Ranges:  Prepandial:   less than 140 mg/dL      Peak postprandial:   less than 180 mg/dL (1-2 hours)      Critically ill patients:  140 - 180 mg/dL   Lab Results  Component Value Date   GLUCAP 113 (H) 12/22/2017   HGBA1C 12.7 (A) 11/20/2017    Review of Glycemic Control Results for Duane [name], Duane [name] (MRN [id number]) as of 12/22/2017 08:58  Ref. Range 12/21/2017 15:59 12/21/2017 20:20 12/22/2017 07:51  Glucose-Capillary Latest Ref Range: 70 - 99 mg/dL 148 (H) 122 (H) 113 (H)   Home: Humalog 15 units TID, Lantus 15 units BID.  Current: lantus 35 units BID, Novolog 0-20 units TID, Novolog 0-5 units QHS.   Noted consult. Blood glucose trending well this Am.  Goes to [employer] and [employer]. Dose just adjusted in the office and considering insulin pump. Pending A1C. Will plan to see Monday.  Thanks,  Bronson Curb, MSN, RNC-OB Diabetes Coordinator [phone] (8a-5p)

## 2017-12-22 NOTE — Progress Notes (Signed)
*  Preliminary Results* Bilateral lower extremity venous duplex completed. Bilateral lower extremities are negative for deep vein thrombosis. There is no evidence of Baker's cyst bilaterally.  12/22/2017 1:25 PM Maudry Mayhew, MHA, RVT, RDCS, RDMS

## 2017-12-23 LAB — CBC WITH DIFFERENTIAL/PLATELET
Abs Immature Granulocytes: 0.06 10*3/uL (ref 0.00–0.07)
BASOS PCT: 1 %
Basophils Absolute: 0.1 10*3/uL (ref 0.0–0.1)
Eosinophils Absolute: 0.3 10*3/uL (ref 0.0–0.5)
Eosinophils Relative: 4 %
HCT: 36.1 % — ABNORMAL LOW (ref 39.0–52.0)
HEMOGLOBIN: 10.9 g/dL — AB (ref 13.0–17.0)
Immature Granulocytes: 1 %
Lymphocytes Relative: 40 %
Lymphs Abs: 3 10*3/uL (ref 0.7–4.0)
MCH: 26.1 pg (ref 26.0–34.0)
MCHC: 30.2 g/dL (ref 30.0–36.0)
MCV: 86.4 fL (ref 80.0–100.0)
MONO ABS: 0.7 10*3/uL (ref 0.1–1.0)
Monocytes Relative: 10 %
Neutro Abs: 3.4 10*3/uL (ref 1.7–7.7)
Neutrophils Relative %: 44 %
PLATELETS: 327 10*3/uL (ref 150–400)
RBC: 4.18 MIL/uL — AB (ref 4.22–5.81)
RDW: 12.9 % (ref 11.5–15.5)
WBC: 7.5 10*3/uL (ref 4.0–10.5)
nRBC: 0 % (ref 0.0–0.2)

## 2017-12-23 LAB — COMPREHENSIVE METABOLIC PANEL
ALK PHOS: 62 U/L (ref 38–126)
ALT: 22 U/L (ref 0–44)
ANION GAP: 8 (ref 5–15)
AST: 21 U/L (ref 15–41)
Albumin: 2.8 g/dL — ABNORMAL LOW (ref 3.5–5.0)
BUN: 32 mg/dL — ABNORMAL HIGH (ref 6–20)
CO2: 21 mmol/L — AB (ref 22–32)
Calcium: 7.8 mg/dL — ABNORMAL LOW (ref 8.9–10.3)
Chloride: 112 mmol/L — ABNORMAL HIGH (ref 98–111)
Creatinine, Ser: 2.09 mg/dL — ABNORMAL HIGH (ref 0.61–1.24)
GFR calc Af Amer: 44 mL/min — ABNORMAL LOW (ref >60)
GFR, EST NON AFRICAN AMERICAN: 38 mL/min — AB (ref >60)
GLUCOSE: 154 mg/dL — AB (ref 70–99)
Potassium: 3.8 mmol/L (ref 3.5–5.1)
SODIUM: 141 mmol/L (ref 135–145)
Total Bilirubin: 0.3 mg/dL (ref 0.3–1.2)
Total Protein: 6.2 g/dL — ABNORMAL LOW (ref 6.5–8.1)

## 2017-12-23 LAB — MAGNESIUM: MAGNESIUM: 2 mg/dL (ref 1.7–2.4)

## 2017-12-23 LAB — GLUCOSE, CAPILLARY
GLUCOSE-CAPILLARY: 128 mg/dL — AB (ref 70–99)
GLUCOSE-CAPILLARY: 79 mg/dL (ref 70–99)
GLUCOSE-CAPILLARY: 235 mg/dL — AB (ref 70–99)
Glucose-Capillary: 135 mg/dL — ABNORMAL HIGH (ref 70–99)

## 2017-12-23 LAB — PHOSPHORUS: PHOSPHORUS: 3.7 mg/dL (ref 2.5–4.6)

## 2017-12-23 LAB — HIV ANTIBODY (ROUTINE TESTING W REFLEX): HIV SCREEN 4TH GENERATION: NONREACTIVE

## 2017-12-23 MED ORDER — PANTOPRAZOLE SODIUM 40 MG PO TBEC
40.0000 mg | DELAYED_RELEASE_TABLET | Freq: Every day | ORAL | Status: DC
  Administered 2017-12-24: 40 mg via ORAL
  Filled 2017-12-23: qty 1

## 2017-12-23 MED ORDER — METOCLOPRAMIDE HCL 5 MG PO TABS
5.0000 mg | ORAL_TABLET | Freq: Three times a day (TID) | ORAL | Status: DC
  Administered 2017-12-23 – 2017-12-24 (×4): 5 mg via ORAL
  Filled 2017-12-23 (×4): qty 1

## 2017-12-23 NOTE — Progress Notes (Signed)
PROGRESS NOTE    Duane [name]  FKC:[id number] DOB: 1976-01-04 DOA: 12/21/2017 PCP: Charlott Rakes, MD   Brief Narrative:  HPI per Dr. Shelly Coss on 12/21/17  Duane [name] is a 42 y.o. male with medical history significant of diabetes type 1, diabetic gastroparesis, diabetic neuropathy and retinopathy, GERD who presents from home to the emergency department today with complaints of intractable nausea, vomiting and abdominal pain which became more severe since yesterday.  Patient was feeling sick from last Tuesday.  He was throwing up.  He thought that it is his gastroparesis flareup.  Nausea, vomiting abdomen pain ultimately subsided on Thursday and he started to eat again.  Since yesterday he had sudden onset of increased abdominal pain and that was pretty bad. He vomited several times all day yesterday.  This was his first gastroparesis flair after a while. Patient has history of uncontrolled diabetes type 1.  He follows with endocrinology Dr. Dwyane Dee and he is on insulin at home. Patient seen and examined at bedside in the emergency department.  He was noted to be slightly hypertensive but otherwise hemodynamically stable.  He will still nauseated and vomited during my evaluation.  He denied any chest pain, shortness of breath no palpitations, fever, chills, dysuria, diarrhea, blurry vision, and hematochezia.  ED Course: Pain management done with IV Dilaudid.  Also given IV Phenergan.  **She feels much better than he did when he came in.  Still not back to baseline but his belly is improved.  Attempted to eat a carb modified diet however had immediate pain and nausea.  IV Reglan was changed to p.o. Reglan today.  Diet will be cut back to full liquid diet and will call Gastroenterology for further evaluation recommendations  Assessment & Plan:   Principal Problem:   Diabetic gastroparesis associated with type 1 diabetes mellitus ([place]) Active Problems:   DM type 1 (diabetes mellitus, type  1) ([place])   HTN (hypertension)   Diabetic neuropathy ([place])   Depression   Gastroparesis   CKD stage 3 secondary to diabetes ([place])  Diabetic Gastroparesis -Associated with Uncontrolled Diabetes. Blood Sugars are adequately controlled  -Continue pain management but minimize Narcotics I possible -Continued IV Reglan but changed to po.  Follows with GI as an outpatient. -Maintain tight glycemic control -I have also advised him to take frequent, small volume, low fatty meals. -Will call Gastroenterology for further evaluation and recommendations   Abdominal pain/nausea, vomiting: -C/w Supportive Care -Metoclopramide 10 mg IV changed to 5 mg po AC/HS, -C/w IVF at 100 mL/hr, Sucralfate 1 gram po 4 times daily, Pantoprazole 40 mg IV, and Promethazine 12.5 mg IV q6hprn -Have called Gastroenterology for further evaluation and recommendations  -C/w Antiemetics and Pain Medications   Diabetes type 1 -Currently uncontrolled.   His hemoglobin A1c was 13 as per 07/2017.   -Follows with Dr. Dwyane Dee, endocrinology.   -C/w Insulin Glaragine 35 units sq BID and with Resistant Novolog SSI AC/HS -CBG's ranging from 79-141 -Checked Hemoglobin A1c level and it was   AKI on CKD stage III -Associated with Diabetes.   -Currently kidney function slightly worsened and BUN/Cr  went from 25/1.93 -> 36/2.49 -> 32/2.09 -Avoid Nephrotoxic Medications if possible and hold  -C/w NS at 100 mL/hr -Repeat CMP in AM  Diabetic Neuropathy -Continue Gabapentin 300 mg po qHS  Hypertension:  -Noted to be slightly hypertensive on presentation.  Continue his home medications but hold Lisinopril 40 mg p.o. Daily -C/w metoprolol 50 mg p.o. twice daily.   -  C/w Cardizem 180 mg po Daily  -Continue to monitor blood pressure.  Morbid Obesity -Estimated body mass index is 48.15 kg/m as calculated from the following:   Height as of this encounter: 6' (1.829 m).   Weight as of this encounter: 161 kg. -Weight Loss and  Dietary Counseling Gven  GERD -Continued Pantoprazole IV 40 mg q24h.  Bilateral Leg Swelling -Check LE Duplex; Negative for DVT  HLD -C/w Atorvastatin 20 mg po qHS  DVT prophylaxis: Heparin 5,000 units sq q8h Code Status: FULL CODE Family Communication: No family present at bedside  Disposition Plan: Remain Inpatient for continued workup and treatment   Consultants:   Discussed with GI and will have them evaluate in AM    Procedures:  LE VENOUS DUPLEX Bilateral lower extremities are negative for deep vein thrombosis. There is no evidence of Baker's cyst bilaterally.   Antimicrobials:  Anti-infectives (From admission, onward)   None     Subjective: Seen and examined at bedside he was feeling good and wanting to try advancing his diet slowly advance from a clear liquid diet heart healthy carb modified.  After he ate his food he immediately started having an abdominal pain and some nausea.  Denies any chest pain.  Kidney function is improved.  Patient states that he has been doing okay and was hoping to go home but became acutely nauseous.  Objective: Vitals:   12/22/17 0504 12/22/17 2036 12/23/17 0447 12/23/17 1429  BP: 93/65 139/80 136/87 117/68  Pulse: 68 72 73 69  Resp: 18 17 18 18   Temp: 98.3 F (36.8 C) 98.1 F (36.7 C) 98 F (36.7 C) 98.9 F (37.2 C)  TempSrc: Oral Oral Oral Oral  SpO2: 93% 96% 96% 92%  Weight:      Height:        Intake/Output Summary (Last 24 hours) at 12/23/2017 1647 Last data filed at 12/23/2017 1400 Gross per 24 hour  Intake 2398.86 ml  Output -  Net 2398.86 ml   Filed Weights   12/21/17 0058  Weight: (!) 161 kg   Examination: Physical Exam:  Constitutional: Alert, well-developed morbidly obese African-American male currently no acute distress appears calm and was comfortable when I went to go see him Eyes: Sclera anicteric.  Lids and conjunctive are normal ENMT: External ears and nose appear normal.  Grossly normal  hearing.  His membranes are moist Neck: Neck appears supple no JVD Respiratory: Diminished to auscultation bilaterally no appreciable wheezing, rales, rhonchi.  Patient is not tachypneic or using any accessory muscles of breathing Cardiovascular: Regular rate and rhythm.  No appreciable murmurs, rubs, gallops.  Has 1+ lower extremity edema noted Abdomen: Soft, nontender, distended second body habitus.  Bowel sounds present all 4 quadrants GU: Deferred Musculoskeletal: Clubbing or cyanosis.  No joint deformities noted Skin: Skin is warm and dry no appreciable rashes or lesions on limited skin evaluation Neurologic: Cranial nerves II through XII grossly intact no appreciable focal deficits.  #And cerebellar reflexes were not assessed Psychiatric: Normal mood and affect.  Intact judgment insight.  Patient is awake and alert and oriented x3.  Data Reviewed: I have personally reviewed following labs and imaging studies  CBC: Recent Labs  Lab 12/21/17 0253 12/22/17 0536 12/23/17 0540  WBC 9.2 7.5 7.5  NEUTROABS  --   --  3.4  HGB 12.1* 10.9* 10.9*  HCT 38.3* 35.6* 36.1*  MCV 81.7 86.4 86.4  PLT 320 288 161   Basic Metabolic Panel: Recent Labs  Lab 12/21/17  2130 12/22/17 0536 12/23/17 0540  NA 139 140 141  K 4.2 3.8 3.8  CL 105 109 112*  CO2 23 23 21*  GLUCOSE 333* 147* 154*  BUN 25* 36* 32*  CREATININE 1.93* 2.49* 2.09*  CALCIUM 8.7* 7.7* 7.8*  MG  --   --  2.0  PHOS  --   --  3.7   GFR: Estimated Creatinine Clearance: 73 mL/min (A) (by C-G formula based on SCr of 2.09 mg/dL (H)). Liver Function Tests: Recent Labs  Lab 12/21/17 0253 12/23/17 0540  AST 23 21  ALT 26 22  ALKPHOS 81 62  BILITOT 0.7 0.3  PROT 7.5 6.2*  ALBUMIN 3.5 2.8*   Recent Labs  Lab 12/21/17 0253  LIPASE 21   No results for input(s): AMMONIA in the last 168 hours. Coagulation Profile: No results for input(s): INR, PROTIME in the last 168 hours. Cardiac Enzymes: No results for input(s):  CKTOTAL, CKMB, CKMBINDEX, TROPONINI in the last 168 hours. BNP (last 3 results) No results for input(s): PROBNP in the last 8760 hours. HbA1C: Recent Labs    12/22/17 0536  HGBA1C 11.8*   CBG: Recent Labs  Lab 12/22/17 1707 12/22/17 2039 12/22/17 2215 12/23/17 0747 12/23/17 1146  GLUCAP 130* 98 141* 128* 79   Lipid Profile: No results for input(s): CHOL, HDL, LDLCALC, TRIG, CHOLHDL, LDLDIRECT in the last 72 hours. Thyroid Function Tests: No results for input(s): TSH, T4TOTAL, FREET4, T3FREE, THYROIDAB in the last 72 hours. Anemia Panel: No results for input(s): VITAMINB12, FOLATE, FERRITIN, TIBC, IRON, RETICCTPCT in the last 72 hours. Sepsis Labs: No results for input(s): PROCALCITON, LATICACIDVEN in the last 168 hours.  No results found for this or any previous visit (from the past 240 hour(s)).   Radiology Studies: Vas [name] Lower Extremity Venous (dvt)  Result Date: 12/23/2017  Lower Venous Study Indications: Swelling.  Limitations: Body habitus. Performing Technologist: Maudry Mayhew MHA, RDMS, RVT, RDCS  Examination Guidelines: A complete evaluation includes B-mode imaging, spectral Doppler, color Doppler, and power Doppler as needed of all accessible portions of each vessel. Bilateral testing is considered an integral part of a complete examination. Limited examinations for reoccurring indications may be performed as noted.  Right Venous Findings: +---------+---------------+---------+-----------+----------+-------------------+          CompressibilityPhasicitySpontaneityPropertiesSummary             +---------+---------------+---------+-----------+----------+-------------------+ CFV                                                   Unable to visualize +---------+---------------+---------+-----------+----------+-------------------+ SFJ                                                   Unable to visualize  +---------+---------------+---------+-----------+----------+-------------------+ FV Prox  Full                                                             +---------+---------------+---------+-----------+----------+-------------------+ FV Mid   Full                                                             +---------+---------------+---------+-----------+----------+-------------------+  FV DistalFull                                                             +---------+---------------+---------+-----------+----------+-------------------+ POP      Full           Yes      Yes                                      +---------+---------------+---------+-----------+----------+-------------------+ PTV      Full                                                             +---------+---------------+---------+-----------+----------+-------------------+ PERO                                                  Unable to visualize +---------+---------------+---------+-----------+----------+-------------------+  Left Venous Findings: +---------+---------------+---------+-----------+----------+-------------------+          CompressibilityPhasicitySpontaneityPropertiesSummary             +---------+---------------+---------+-----------+----------+-------------------+ CFV                                                   Unable to visualize +---------+---------------+---------+-----------+----------+-------------------+ SFJ                                                   Unable to visualize +---------+---------------+---------+-----------+----------+-------------------+ FV Prox  Full                                                             +---------+---------------+---------+-----------+----------+-------------------+ FV Mid   Full                                                              +---------+---------------+---------+-----------+----------+-------------------+ FV DistalFull                                                             +---------+---------------+---------+-----------+----------+-------------------+ POP      Full           Yes  Yes                                      +---------+---------------+---------+-----------+----------+-------------------+ PTV      Full                                                             +---------+---------------+---------+-----------+----------+-------------------+ PERO                                                  Unable to visualize +---------+---------------+---------+-----------+----------+-------------------+    Summary: Right: There is no evidence of deep vein thrombosis in the lower extremity. However, portions of this examination were limited- see technologist comments above. No cystic structure found in the popliteal fossa. Left: There is no evidence of deep vein thrombosis in the lower extremity. However, portions of this examination were limited- see technologist comments above. No cystic structure found in the popliteal fossa.  *See table(s) above for measurements and observations. Electronically signed by Monica Martinez MD on 12/23/2017 at 3:52:35 PM.    Final      Scheduled Meds: . atorvastatin  20 mg Oral q1800  . diltiazem  180 mg Oral Daily  . gabapentin  300 mg Oral QHS  . heparin injection (subcutaneous)  5,000 Units Subcutaneous Q8H  . insulin aspart  0-20 Units Subcutaneous TID WC  . insulin aspart  0-5 Units Subcutaneous QHS  . insulin glargine  35 Units Subcutaneous BID  . lisinopril  40 mg Oral Daily  . metoCLOPramide  5 mg Oral TID AC & HS  . metoprolol tartrate  50 mg Oral BID  . [START ON 12/24/2017] pantoprazole  40 mg Oral Daily  . sucralfate  1 g Oral QID   Continuous Infusions: . sodium chloride 100 mL/hr at 12/23/17 1400    LOS: 1 day   Kerney Elbe, DO [facility] PAGER is on [place]  If 7PM-7AM, please contact night-coverage [email or web address] Password [facility] 12/23/2017, 4:47 PM

## 2017-12-24 LAB — CBC WITH DIFFERENTIAL/PLATELET
Abs Immature Granulocytes: 0.1 10*3/uL — ABNORMAL HIGH (ref 0.00–0.07)
BASOS ABS: 0.1 10*3/uL (ref 0.0–0.1)
BASOS PCT: 1 %
EOS ABS: 0.3 10*3/uL (ref 0.0–0.5)
EOS PCT: 4 %
HCT: 34.7 % — ABNORMAL LOW (ref 39.0–52.0)
Hemoglobin: 10.8 g/dL — ABNORMAL LOW (ref 13.0–17.0)
IMMATURE GRANULOCYTES: 1 %
Lymphocytes Relative: 32 %
Lymphs Abs: 2.3 10*3/uL (ref 0.7–4.0)
MCH: 26.6 pg (ref 26.0–34.0)
MCHC: 31.1 g/dL (ref 30.0–36.0)
MCV: 85.5 fL (ref 80.0–100.0)
Monocytes Absolute: 0.6 10*3/uL (ref 0.1–1.0)
Monocytes Relative: 9 %
NRBC: 0 % (ref 0.0–0.2)
Neutro Abs: 3.9 10*3/uL (ref 1.7–7.7)
Neutrophils Relative %: 53 %
PLATELETS: 318 10*3/uL (ref 150–400)
RBC: 4.06 MIL/uL — AB (ref 4.22–5.81)
RDW: 12.6 % (ref 11.5–15.5)
WBC: 7.3 10*3/uL (ref 4.0–10.5)

## 2017-12-24 LAB — COMPREHENSIVE METABOLIC PANEL
ALBUMIN: 2.6 g/dL — AB (ref 3.5–5.0)
ALK PHOS: 65 U/L (ref 38–126)
ALT: 20 U/L (ref 0–44)
AST: 23 U/L (ref 15–41)
Anion gap: 9 (ref 5–15)
BUN: 26 mg/dL — ABNORMAL HIGH (ref 6–20)
CALCIUM: 7.8 mg/dL — AB (ref 8.9–10.3)
CHLORIDE: 113 mmol/L — AB (ref 98–111)
CO2: 19 mmol/L — AB (ref 22–32)
Creatinine, Ser: 1.71 mg/dL — ABNORMAL HIGH (ref 0.61–1.24)
GFR calc Af Amer: 56 mL/min — ABNORMAL LOW (ref >60)
GFR calc non Af Amer: 48 mL/min — ABNORMAL LOW (ref >60)
GLUCOSE: 193 mg/dL — AB (ref 70–99)
Potassium: 4.5 mmol/L (ref 3.5–5.1)
SODIUM: 141 mmol/L (ref 135–145)
Total Bilirubin: 0.4 mg/dL (ref 0.3–1.2)
Total Protein: 5.9 g/dL — ABNORMAL LOW (ref 6.5–8.1)

## 2017-12-24 LAB — MAGNESIUM: Magnesium: 1.9 mg/dL (ref 1.7–2.4)

## 2017-12-24 LAB — PHOSPHORUS: PHOSPHORUS: 3.7 mg/dL (ref 2.5–4.6)

## 2017-12-24 LAB — GLUCOSE, CAPILLARY
GLUCOSE-CAPILLARY: 154 mg/dL — AB (ref 70–99)
GLUCOSE-CAPILLARY: 96 mg/dL (ref 70–99)

## 2017-12-24 MED ORDER — FUROSEMIDE 40 MG PO TABS: 40.0000 mg | ORAL_TABLET | Freq: Every day | ORAL | 6 refills | Status: DC

## 2017-12-24 MED ORDER — LISINOPRIL 40 MG PO TABS: 40.0000 mg | ORAL_TABLET | Freq: Every day | ORAL | 0 refills | Status: DC

## 2017-12-24 NOTE — Discharge Summary (Signed)
Physician Discharge Summary  Duane [name] ZOX:[id number] DOB: 1975/03/15 DOA: 12/21/2017  PCP: Charlott Rakes, MD  Admit date: 12/21/2017 Discharge date: 12/24/2017  Admitted From: Home Disposition:  Home  Recommendations for Outpatient Follow-up:  1. Follow up with PCP in 1-2 weeks 2. Follow up with Endocrinology as an outpatient 3. Follow up with Psychiatry as an outpatient  4. Follow up with Gastroenterology as an outpatient  5. Follow up with Nephrology Dr. Conni Slipper as an outpatient  6. Outpatient ECHOCardiogram  7. Please obtain CMP/CBC, Mag, Phos in one week 8. Please follow up on the following pending results:  Home Health: No Equipment/Devices: None  Discharge Condition: Stable  CODE STATUS: FULL CODE Diet recommendation: Heart Healthy/Carb Modified Soft Diet  Brief/Interim Summary: HPI per Dr. Shelly Coss on 12/21/17 Duane [name] a 42 y.o.malewith medical history significant ofdiabetes type 1, diabeticgastroparesis, diabetic neuropathy and retinopathy, GERD who presents from home to the emergency department today with complaints of intractable nausea, vomiting and abdominal pain which became moreseveresince yesterday. Patient was feeling sick from last Tuesday. He was throwing up. He thought that it ishis gastroparesis flareup. Nausea, vomiting abdomen pain ultimately subsided on Thursday and he started to eat again. Since yesterday he had sudden onset of increased abdominal pain and that wasprettybad. Hevomited several times all day yesterday.This was his firstgastroparesisflair after awhile. Patient has history of uncontrolled diabetes type 1. He follows with endocrinology Dr. Dwyane Dee and he is on insulin at home. Patient seen and examined at bedside in the emergency department.He was noted to be slightly hypertensive but otherwise hemodynamically stable. He will still nauseated and vomited during my evaluation.He denied any chest pain,  shortness of breath no palpitations, fever, chills, dysuria, diarrhea, blurry vision, and hematochezia.  ED Course:Pain management done with IV Dilaudid.Also given IV Phenergan.  **He feels much better than he did when he came in.  He was Still not back to baseline but his belly is improved.  Attempted to eat a carb modified diet yesterday however had immediate pain and nausea.  IV Reglan was changed to p.o. Reglan.  Diet was  cut back to full liquid diet and will call Gastroenterology for further evaluation recommendations due to mild worsening. He improved and had minimal pain and GI cleared the patient to go home without intervention. He is to follow with Dr. Carlean Purl as an outpatient.  While hospitalized patient also mentioned that he was somewhat depressed but denied any suicidal homicidal ideation or AVH.  Psychiatry was consulted for further evaluation recommendations however was unable to get to the patient today so he will be discharged home and is provided resources by social worker for outpatient psychiatric follow-up and patient is agreeable to follow-up with these.  Patient will be medically stable to be discharged at this time as he improved from a diabetic gastroparesis and his abdominal pain nausea vomiting is all improved.  Kidney function improved and he is advised to hold his Lasix as well as his lisinopril for a day and will start back on Thursday.  He is to follow-up with his PCP, nephrologist, gastroenterologist, as well as endocrinologist and psychiatric services in the outpatient setting.  Discharge Diagnoses:  Principal Problem:   Diabetic gastroparesis associated with type 1 diabetes mellitus ([place]) Active Problems:   DM type 1 (diabetes mellitus, type 1) ([facility])   HTN (hypertension)   Diabetic neuropathy ([place])   Depression   Gastroparesis   CKD stage 3 secondary to diabetes ([place])  Diabetic Gastroparesis -Associated with Uncontrolled  Diabetes. Blood Sugars are  adequately controlled  -Continue pain management but minimize Narcotics I possible -Continued IV Reglan but changed to po.Follows with GI as an outpatient. -Maintain tight glycemic control -I have also advised him to take frequent, small volume, lowfattymeals. -GI consulted for further evaluation recommendations the patient improved without any intervention.  Diet was advanced by GI patient tolerated soft diet and he is deemed medically stable to be discharged -He will need follow-up with Dr. Carlean Purl in outpatient setting in 1 to 2 weeks  Abdominal pain/nausea, vomiting: -C/w Supportive Care -Metoclopramide 10 mg IV changed to 5 mg po AC/HS, -C/w IVF at 100 mL/hr, Sucralfate 1 gram po 4 times daily, Pantoprazole 40 mg IV changed to po and Promethazine 12.5 mg IV q6hprn -Have called Gastroenterology for further evaluation and recommendations; Patient improved without intervention -Diet was advanced and he tolerated it well -C/w Antiemetics and Pain Medications while hospitalized -Follow up Gastroenterology Dr. Carlean Purl as an outpatient  Diabetes type 1 -Currently uncontrolled.  His hemoglobin A1c was 13 as per 07/2017.  -Follows with Dr. Dwyane Dee, endocrinology.  -C/w Insulin Glaragine 35 units sq BID and with Resistant Novolog SSI AC/HS -CBG's ranging from 96-135 -Checked Hemoglobin A1c level and it was 11.8 -Follow up with Endocrinology Dr. Dwyane Dee as an outpatient   AKI on CKD stage III, improved  -Associated with Diabetes.  -Currently kidney function slightly worsened and BUN/Cr  went from 25/1.93 -> 36/2.49 -> 32/2.09 -> 26/1.71 -Avoid Nephrotoxic Medications if possible and hold Lisinopril and Lasix for a day and resume Thursday -C/w NS at 100 mL/hr -Repeat CMP as an outpatient -Follow up with Dr. Carolin Sicks as an outpatient   Diabetic Neuropathy -Continue Gabapentin 300 mg po qHS  Hypertension:  -Noted to be slightly hypertensive on presentation. Continue his home  medications but held Lisinopril 40 mg p.o. Daily and Lasix but can resume in the outpatient setting  -C/w metoprolol 50 mg p.o. twice daily.  -C/w Cardizem 180 mg po Daily  -Continue to monitor blood pressure.  Morbid Obesity -Estimated body mass index is 48.15 kg/m as calculated from the following:   Height as of this encounter: 6' (1.829 m).   Weight as of this encounter: 161 kg. -Weight Loss and Dietary Counseling [facility]  GERD -Continued Pantoprazole IV 40 mg q24h and changed to po  Bilateral Leg Swelling -Check LE Duplex; Negative for DVT -Resume Home Lasix on Thursday 12/26/17 -Recommend Outpatient ECHOCardiogram   HLD -C/w Atorvastatin 20 mg po qHS  Depression -No suicidal ideation, homicidal ideation or AVH- -Consulted psychiatry for further evaluation recommendations however were unable to make it to see the patient today -Education officer, museum provided resources for the patient to follow-up with psychiatric services in outpatient setting and patient is agreeable to this plan  Discharge Instructions  Discharge Instructions    Call MD for:  difficulty breathing, headache or visual disturbances   Complete by:  As directed    Call MD for:  extreme fatigue   Complete by:  As directed    Call MD for:  hives   Complete by:  As directed    Call MD for:  persistant dizziness or light-headedness   Complete by:  As directed    Call MD for:  persistant nausea and vomiting   Complete by:  As directed    Call MD for:  redness, tenderness, or signs of infection (pain, swelling, redness, odor or green/yellow discharge around incision site)   Complete by:  As directed  Call MD for:  severe uncontrolled pain   Complete by:  As directed    Call MD for:  temperature >100.4   Complete by:  As directed    Diet - low sodium heart healthy   Complete by:  As directed    Discharge instructions   Complete by:  As directed    You were cared for by a hospitalist during your hospital  stay. If you have any questions about your discharge medications or the care you received while you were in the hospital after you are discharged, you can call the unit and ask to speak with the hospitalist on call if the hospitalist that took care of you is not available. Once you are discharged, your primary care physician will handle any further medical issues. Please note that NO REFILLS for any discharge medications will be authorized once you are discharged, as it is imperative that you return to your primary care physician (or establish a relationship with a primary care physician if you do not have one) for your aftercare needs so that they can reassess your need for medications and monitor your lab values.  Follow up with PCP, Nephrology, and Psychiatry as an outpatient. Take all medications as prescribed. If symptoms change or worsen please return to the ED for evaluation   Increase activity slowly   Complete by:  As directed      Allergies as of 12/24/2017   No Known Allergies     Medication List    STOP taking these medications   amLODipine 5 MG tablet Commonly known as:  NORVASC     TAKE these medications   aluminum chloride 20 % external solution Commonly known as:  DRYSOL Apply topically at bedtime.   atorvastatin 20 MG tablet Commonly known as:  LIPITOR Take 1 tablet (20 mg total) by mouth daily.   diltiazem 180 MG 24 hr capsule Commonly known as:  CARDIZEM CD Take 1 capsule (180 mg total) by mouth daily.   furosemide 40 MG tablet Commonly known as:  LASIX Take 1 tablet (40 mg total) by mouth daily. Hold Tomorrow's Dose and resume 12/26/17 Start taking on:  12/26/2017 What changed:    additional instructions  These instructions start on 12/26/2017. If you are unsure what to do until then, ask your doctor or other care provider.   gabapentin 300 MG capsule Commonly known as:  NEURONTIN Take 1 capsule (300 mg total) by mouth at bedtime.   glucose blood test  strip 1 each by Other route 3 (three) times daily.   glucose blood test strip Use as instructed   Insulin Glargine 100 UNIT/ML Solostar Pen Commonly known as:  LANTUS Inject 35 Units into the skin 2 (two) times daily. What changed:    how much to take  when to take this   insulin lispro 100 UNIT/ML KiwkPen Commonly known as:  HUMALOG Inject 0.15 mLs (15 Units total) into the skin 3 (three) times daily.   Insulin Pen Needle 31G X 8 MM Misc Inject 1 each into the skin 4 (four) times daily. Check blood sugar TID and QHS   lisinopril 40 MG tablet Commonly known as:  PRINIVIL,ZESTRIL Take 1 tablet (40 mg total) by mouth daily. HOLD tomorrow's dose and resume 12/26/17 Start taking on:  12/26/2017 What changed:    additional instructions  These instructions start on 12/26/2017. If you are unsure what to do until then, ask your doctor or other care provider.   metoCLOPramide 10  MG tablet Commonly known as:  REGLAN TAKE 1 TABLET BY MOUTH 3 TIMES DAILY BEFORE MEALS AS NEEDED What changed:  See the new instructions.   metoprolol tartrate 50 MG tablet Commonly known as:  LOPRESSOR Take 50 mg by mouth 2 (two) times daily.   pantoprazole 40 MG tablet Commonly known as:  PROTONIX Take 1 tablet (40 mg total) by mouth daily.   Peppermint Oil 90 MG Cpcr Take 2 capsules by mouth 3 (three) times daily as needed.   potassium chloride 10 MEQ CR capsule Commonly known as:  MICRO-K TAKE 1 TABLET BY MOUTH DAILY. What changed:  how much to take   promethazine 25 MG tablet Commonly known as:  PHENERGAN TAKE 1 TABLET BY MOUTH EVERY 8 HOURS AS NEEDED FOR NAUSEA OR VOMITING. What changed:  See the new instructions.   sucralfate 1 g tablet Commonly known as:  CARAFATE TAKE 1 TABLET BY MOUTH 4 TIMES DAILY WITH MEALS AND AT BEDTIME. What changed:  See the new instructions.   Tadalafil 2.5 MG Tabs Take 1 tablet (2.5 mg total) by mouth daily.   TRUE METRIX METER w/Device Kit 1 each by  Does not apply route 4 (four) times daily -  before meals and at bedtime.   TRUEPLUS LANCETS 28G Misc 1 each by Does not apply route 4 (four) times daily -  before meals and at bedtime.       No Known Allergies  Consultations: Gastroenterology  Procedures/Studies: Vas [name] Lower Extremity Venous (dvt)  Result Date: 12/23/2017  Lower Venous Study Indications: Swelling.  Limitations: Body habitus. Performing Technologist: Maudry Mayhew MHA, RDMS, RVT, RDCS  Examination Guidelines: A complete evaluation includes B-mode imaging, spectral Doppler, color Doppler, and power Doppler as needed of all accessible portions of each vessel. Bilateral testing is considered an integral part of a complete examination. Limited examinations for reoccurring indications may be performed as noted.  Right Venous Findings: +---------+---------------+---------+-----------+----------+-------------------+          CompressibilityPhasicitySpontaneityPropertiesSummary             +---------+---------------+---------+-----------+----------+-------------------+ CFV                                                   Unable to visualize +---------+---------------+---------+-----------+----------+-------------------+ SFJ                                                   Unable to visualize +---------+---------------+---------+-----------+----------+-------------------+ FV Prox  Full                                                             +---------+---------------+---------+-----------+----------+-------------------+ FV Mid   Full                                                             +---------+---------------+---------+-----------+----------+-------------------+ FV DistalFull                                                             +---------+---------------+---------+-----------+----------+-------------------+  POP      Full           Yes      Yes                                       +---------+---------------+---------+-----------+----------+-------------------+ PTV      Full                                                             +---------+---------------+---------+-----------+----------+-------------------+ PERO                                                  Unable to visualize +---------+---------------+---------+-----------+----------+-------------------+  Left Venous Findings: +---------+---------------+---------+-----------+----------+-------------------+          CompressibilityPhasicitySpontaneityPropertiesSummary             +---------+---------------+---------+-----------+----------+-------------------+ CFV                                                   Unable to visualize +---------+---------------+---------+-----------+----------+-------------------+ SFJ                                                   Unable to visualize +---------+---------------+---------+-----------+----------+-------------------+ FV Prox  Full                                                             +---------+---------------+---------+-----------+----------+-------------------+ FV Mid   Full                                                             +---------+---------------+---------+-----------+----------+-------------------+ FV DistalFull                                                             +---------+---------------+---------+-----------+----------+-------------------+ POP      Full           Yes      Yes                                      +---------+---------------+---------+-----------+----------+-------------------+ PTV      Full                                                             +---------+---------------+---------+-----------+----------+-------------------+  PERO                                                  Unable to visualize  +---------+---------------+---------+-----------+----------+-------------------+    Summary: Right: There is no evidence of deep vein thrombosis in the lower extremity. However, portions of this examination were limited- see technologist comments above. No cystic structure found in the popliteal fossa. Left: There is no evidence of deep vein thrombosis in the lower extremity. However, portions of this examination were limited- see technologist comments above. No cystic structure found in the popliteal fossa.  *See table(s) above for measurements and observations. Electronically signed by Monica Martinez MD on 12/23/2017 at 3:52:35 PM.    Final      Subjective: Seen and examined at bedside and was doing well.  No chest pain, lightheadedness or dizziness.  States his abdomen was nauseous for and is wanted to try soft diet again.  Tolerating soft diet without issues and was deemed medically stable to be discharged.  Discussed with him about his depression he states that he will follow-up as an outpatient.  No other concerns or plans at this time.  Discharge Exam: Vitals:   12/24/17 0444 12/24/17 1436  BP: 132/86 136/80  Pulse: 67 68  Resp: 20 18  Temp: 98.6 F (37 C) 99.5 F (37.5 C)  SpO2: 96% 96%   Vitals:   12/23/17 1429 12/23/17 1954 12/24/17 0444 12/24/17 1436  BP: 117/68 (!) 145/91 132/86 136/80  Pulse: 69 70 67 68  Resp: 18 16 20 18   Temp: 98.9 F (37.2 C) 98.5 F (36.9 C) 98.6 F (37 C) 99.5 F (37.5 C)  TempSrc: Oral Oral Oral Oral  SpO2: 92% 96% 96% 96%  Weight:      Height:       General: Pt is alert, awake, not in acute distress Cardiovascular: RRR, S1/S2 +, no rubs, no gallops Respiratory: CTA bilaterally, no wheezing, no rhonchi Abdominal: Soft, Slightly tender, Distended 2/2 body habitus, bowel sounds + Extremities: 1+ LE edema, no cyanosis  The results of significant diagnostics from this hospitalization (including imaging, microbiology, ancillary and laboratory)  are listed below for reference.    Microbiology: No results found for this or any previous visit (from the past 240 hour(s)).   Labs: BNP (last 3 results) No results for input(s): BNP in the last 8760 hours. Basic Metabolic Panel: Recent Labs  Lab 12/21/17 0253 12/22/17 0536 12/23/17 0540 12/24/17 0621  NA 139 140 141 141  K 4.2 3.8 3.8 4.5  CL 105 109 112* 113*  CO2 23 23 21* 19*  GLUCOSE 333* 147* 154* 193*  BUN 25* 36* 32* 26*  CREATININE 1.93* 2.49* 2.09* 1.71*  CALCIUM 8.7* 7.7* 7.8* 7.8*  MG  --   --  2.0 1.9  PHOS  --   --  3.7 3.7   Liver Function Tests: Recent Labs  Lab 12/21/17 0253 12/23/17 0540 12/24/17 0621  AST 23 21 23   ALT 26 22 20   ALKPHOS 81 62 65  BILITOT 0.7 0.3 0.4  PROT 7.5 6.2* 5.9*  ALBUMIN 3.5 2.8* 2.6*   Recent Labs  Lab 12/21/17 0253  LIPASE 21   No results for input(s): AMMONIA in the last 168 hours. CBC: Recent Labs  Lab 12/21/17 0253 12/22/17 0536 12/23/17 0540 12/24/17 0752  WBC 9.2 7.5 7.5  7.3  NEUTROABS  --   --  3.4 3.9  HGB 12.1* 10.9* 10.9* 10.8*  HCT 38.3* 35.6* 36.1* 34.7*  MCV 81.7 86.4 86.4 85.5  PLT 320 288 327 318   Cardiac Enzymes: No results for input(s): CKTOTAL, CKMB, CKMBINDEX, TROPONINI in the last 168 hours. BNP: Invalid input(s): POCBNP CBG: Recent Labs  Lab 12/23/17 1146 12/23/17 1723 12/23/17 2152 12/24/17 0750 12/24/17 1153  GLUCAP 79 235* 135* 154* 96   D-Dimer No results for input(s): DDIMER in the last 72 hours. Hgb A1c Recent Labs    12/22/17 0536  HGBA1C 11.8*   Lipid Profile No results for input(s): CHOL, HDL, LDLCALC, TRIG, CHOLHDL, LDLDIRECT in the last 72 hours. Thyroid function studies No results for input(s): TSH, T4TOTAL, T3FREE, THYROIDAB in the last 72 hours.  Invalid input(s): FREET3 Anemia work up No results for input(s): VITAMINB12, FOLATE, FERRITIN, TIBC, IRON, RETICCTPCT in the last 72 hours. Urinalysis    Component Value Date/Time   COLORURINE YELLOW  12/21/2017 0253   APPEARANCEUR CLEAR 12/21/2017 0253   LABSPEC 1.022 12/21/2017 0253   PHURINE 5.0 12/21/2017 0253   GLUCOSEU >=500 (A) 12/21/2017 0253   HGBUR MODERATE (A) 12/21/2017 0253   BILIRUBINUR NEGATIVE 12/21/2017 0253   BILIRUBINUR negative 06/01/2016 1059   KETONESUR 5 (A) 12/21/2017 0253   PROTEINUR >=300 (A) 12/21/2017 0253   UROBILINOGEN 0.2 06/01/2016 1059   UROBILINOGEN 0.2 06/27/2014 0748   NITRITE NEGATIVE 12/21/2017 0253   LEUKOCYTESUR NEGATIVE 12/21/2017 0253   Sepsis Labs Invalid input(s): PROCALCITONIN,  WBC,  LACTICIDVEN Microbiology No results found for this or any previous visit (from the past 240 hour(s)).  Time coordinating discharge: 35 minutes  SIGNED:  Kerney Elbe, DO [facility] 12/24/2017, 9:40 PM Pager is on [place]  If 7PM-7AM, please contact night-coverage [email or web address] Password [email or web address]

## 2017-12-24 NOTE — Consult Note (Addendum)
Consultation  Referring Provider: Dr. Alfredia Ferguson     Primary Care Physician:  Charlott Rakes, MD Primary Gastroenterologist: Dr. Carlean Purl       Reason for Consultation: Diabetic gastroparesis            HPI:   Duane [name] is a 42 y.o. male with a past medical history significant for diabetes type 1, diabetic gastroparesis, diabetic neuropathy and retinopathy as well as reflux, who presented to the ER on 12/21/2017 with a complaint of intractable nausea, vomiting abdominal pain which become more severe on 12/20/2017.    At time of presentation patient described that he had been feeling sick for about 4 days and he thought it was his gastroparesis flaring up with nausea, vomiting and abdominal pain which subsided 2 days prior and he had started to eat again, but then he had sudden onset of increased abdominal pain and vomited several times on 12/20/2017.    Today, patient reports that he is actually feeling better.  He tells me that this is just a "typical flare of my gastroparesis".  He tells me that last Tuesday, 12/17/2017 he started feeling bad with nausea and some epigastric pain so he took some Promethazine and laid in bed that day, on Wednesday or Thursday he tried to return to a regular diet which he tells me is often unhealthy with a lot of fast food choices and continued with epigastric pain, nausea and started vomiting.  He knew that he could not handle this anymore at home and presented to the hospital.  Since being here, patient tells me that he tolerated a regular diet yesterday eating a tuna sandwich for lunch.  Tells me that he did have some epigastric pain after doing so but "this is normal for me and these episodes and after I vomited for 4 days".  Hospitalist then changed him to a full liquid diet again yesterday and patient tells me he tolerated this fine this morning and last night.  He has had no further episodes of vomiting and no nausea and his abdominal pain is subsiding.  Overall  patient feels as though he may be able to go home today.    Denies fever, chills, weight loss, heartburn or reflux.  ED course: IV Dilaudid and IV Phenergan, slightly hypertensive  GI history: 09/14/2017 office visit with Dr. Carlean Purl: At that time patient reported doing fairly well with some episodic diarrhea for which he would take Xifaxan for a day or 2, regurgitates and vomit some of the mornings, is recommended he work on his diet and diabetes control and he was tried on IBgard for likely diabetic enteropathy told to stop taking Xifaxan 06/11/2013 EGD: Medium sized hiatal hernia and otherwise normal  Past Medical History:  Diagnosis Date  . Depression   . Diabetes mellitus without complication [facility]) age 74   insulin requiring from the start  . Diabetic neuropathy ([place]) Dx [address]  . Diabetic proliferative retinopathy ([place]) 08/02/2017  . Gastritis   . Gastroparesis 2012   100% gastric retention on 08/2013 G[facility]  . GERD (gastroesophageal reflux disease) Dx 2012  . Hypertension Dx 2012  . Obesity     Past Surgical History:  Procedure Laterality Date  . CHOLECYSTECTOMY  2013  . ESOPHAGOGASTRODUODENOSCOPY N/A 06/11/2013   Procedure: ESOPHAGOGASTRODUODENOSCOPY (EGD);  Surgeon: [name], MD;  Location: [facility];  Service: Endoscopy;  Laterality: N/A;  . EYE SURGERY    . FLEXIBLE SIGMOIDOSCOPY N/A 06/11/2013   Procedure: FLEXIBLE SIGMOIDOSCOPY;  Surgeon: [name], MD;  Location: [facility];  Service: Endoscopy;  Laterality: N/A;  . KNEE SURGERY Left     Family History  Problem Relation Age of Onset  . Diabetes Father   . Diabetes Maternal Grandmother   . Diabetes Paternal Grandmother   . Colon cancer Neg Hx   . Stomach cancer Neg Hx     Social History   Tobacco Use  . Smoking status: Never Smoker  . Smokeless tobacco: Never Used  Substance Use Topics  . Alcohol use: No  . Drug use: No    Prior to Admission medications   Medication Sig Start Date End Date  Taking? Authorizing Provider  aluminum chloride (DRYSOL) 20 % external solution Apply topically at bedtime. 05/17/17  Yes Charlott Rakes, MD  amLODipine (NORVASC) 5 MG tablet Take by mouth. 10/10/12  Yes [provider]  atorvastatin (LIPITOR) 20 MG tablet Take 1 tablet (20 mg total) by mouth daily. 11/20/17  Yes Charlott Rakes, MD  diltiazem (CARTIA XT) 180 MG 24 hr capsule Take 1 capsule (180 mg total) by mouth daily. 11/20/17  Yes Charlott Rakes, MD  furosemide (LASIX) 40 MG tablet Take 1 tablet (40 mg total) by mouth daily. 11/20/17  Yes Charlott Rakes, MD  gabapentin (NEURONTIN) 300 MG capsule Take 1 capsule (300 mg total) by mouth at bedtime. 11/20/17  Yes Charlott Rakes, MD  Insulin Glargine (LANTUS SOLOSTAR) 100 UNIT/ML Solostar Pen Inject 35 Units into the skin 2 (two) times daily. Patient taking differently: Inject 70 Units into the skin at bedtime.  08/20/17  Yes Newlin, Enobong, MD  insulin lispro (HUMALOG KWIKPEN) 100 UNIT/ML KiwkPen Inject 0.15 mLs (15 Units total) into the skin 3 (three) times daily. 01/16/17  Yes Charlott Rakes, MD  lisinopril (PRINIVIL,ZESTRIL) 40 MG tablet Take 40 mg by mouth daily.  05/09/13  Yes [provider]  metoCLOPramide (REGLAN) 10 MG tablet TAKE 1 TABLET BY MOUTH 3 TIMES DAILY BEFORE MEALS AS NEEDED Patient taking differently: Take 10 mg by mouth every 8 (eight) hours as needed for nausea. TAKE 1 TABLET BY MOUTH 3 TIMES DAILY BEFORE MEALS AS NEEDED 08/21/17  Yes Charlott Rakes, MD  metoprolol tartrate (LOPRESSOR) 50 MG tablet Take 50 mg by mouth 2 (two) times daily.    Yes [provider]  pantoprazole (PROTONIX) 40 MG tablet Take 1 tablet (40 mg total) by mouth daily. 05/17/17  Yes Newlin, Charlane Ferretti, MD  potassium chloride (MICRO-K) 10 MEQ CR capsule TAKE 1 TABLET BY MOUTH DAILY. Patient taking differently: Take 10 mEq by mouth daily.  07/17/17  Yes Newlin, Charlane Ferretti, MD  promethazine (PHENERGAN) 25 MG tablet TAKE 1 TABLET BY MOUTH EVERY 8  HOURS AS NEEDED FOR NAUSEA OR VOMITING. Patient taking differently: Take 25 mg by mouth every 6 (six) hours as needed for nausea or vomiting.  04/23/16  Yes Funches, Josalyn, MD  sucralfate (CARAFATE) 1 g tablet TAKE 1 TABLET BY MOUTH 4 TIMES DAILY WITH MEALS AND AT BEDTIME. Patient taking differently: Take 1 g by mouth 4 (four) times daily.  10/18/17  Yes Charlott Rakes, MD  tadalafil 2.5 MG TABS Take 1 tablet (2.5 mg total) by mouth daily. 12/23/15  Yes Funches, Josalyn, MD  Blood Glucose Monitoring Suppl (TRUE METRIX METER) W/DEVICE KIT 1 each by Does not apply route 4 (four) times daily -  before meals and at bedtime. 11/05/14   Funches, Adriana Mccallum, MD  glucose blood (CONTOUR NEXT TEST) test strip Use as instructed 10/11/17   Dwyane Dee,  Ajay, MD  glucose blood (TRUE METRIX BLOOD GLUCOSE TEST) test strip 1 each by Other route 3 (three) times daily. 08/20/16   Funches, Adriana Mccallum, MD  Insulin Pen Needle 31G X 8 MM MISC Inject 1 each into the skin 4 (four) times daily. Check blood sugar TID and QHS 06/15/16   Funches, Josalyn, MD  Peppermint Oil (IBGARD) 90 MG CPCR Take 2 capsules by mouth 3 (three) times daily as needed. 10/11/17   [name], MD  TRUEPLUS LANCETS 28G MISC 1 each by Does not apply route 4 (four) times daily -  before meals and at bedtime. 11/04/14   Boykin Nearing, MD    Current Facility-Administered Medications  Medication Dose Route Frequency Provider Last Rate Last Dose  . 0.9 %  sodium chloride infusion   Intravenous Continuous Shelly Coss, MD 100 mL/hr at 12/24/17 0332    . atorvastatin (LIPITOR) tablet 20 mg  20 mg Oral q1800 Shelly Coss, MD   20 mg at 12/23/17 1716  . chlorproMAZINE (THORAZINE) tablet 25 mg  25 mg Oral TID PRN Shelly Coss, MD      . diltiazem (CARDIZEM CD) 24 hr capsule 180 mg  180 mg Oral Daily Shelly Coss, MD   180 mg at 12/24/17 0932  . gabapentin (NEURONTIN) capsule 300 mg  300 mg Oral QHS Shelly Coss, MD   300 mg at 12/23/17 2208  . heparin  injection 5,000 Units  5,000 Units Subcutaneous Q8H Shelly Coss, MD   5,000 Units at 12/24/17 0630  . HYDROmorphone (DILAUDID) injection 1 mg  1 mg Intravenous Q6H PRN Shelly Coss, MD   1 mg at 12/24/17 0016  . insulin aspart (novoLOG) injection 0-20 Units  0-20 Units Subcutaneous TID WC Shelly Coss, MD   4 Units at 12/24/17 0804  . insulin aspart (novoLOG) injection 0-5 Units  0-5 Units Subcutaneous QHS Adhikari, Amrit, MD      . insulin glargine (LANTUS) injection 35 Units  35 Units Subcutaneous BID Shelly Coss, MD   35 Units at 12/24/17 0932  . metoCLOPramide (REGLAN) tablet 5 mg  5 mg Oral TID AC & HS Raiford Noble [place], DO   5 mg at 12/24/17 0804  . metoprolol tartrate (LOPRESSOR) tablet 50 mg  50 mg Oral BID Shelly Coss, MD   50 mg at 12/24/17 0932  . pantoprazole (PROTONIX) EC tablet 40 mg  40 mg Oral Daily Raiford Noble [place], DO   40 mg at 12/24/17 0932  . promethazine (PHENERGAN) injection 12.5 mg  12.5 mg Intravenous Q6H PRN Shelly Coss, MD   12.5 mg at 12/23/17 0433  . sucralfate (CARAFATE) tablet 1 g  1 g Oral QID Shelly Coss, MD   1 g at 12/24/17 0932    Allergies as of 12/21/2017  . (No Known Allergies)     Review of Systems:    Constitutional: No weight loss, fever or chills Skin: No rash Cardiovascular: No chest pain Respiratory: No SOB  Gastrointestinal: See HPI and otherwise negative Genitourinary: No dysuria  Neurological: No headache, dizziness or syncope Musculoskeletal: No new muscle or joint pain Hematologic: No bleeding  Psychiatric: No history of depression or anxiety    Physical Exam:  Vital signs in last 24 hours: Temp:  [98.5 F (36.9 C)-98.9 F (37.2 C)] 98.6 F (37 C) (11/12 0444) Pulse Rate:  [67-70] 67 (11/12 0444) Resp:  [16-20] 20 (11/12 0444) BP: (117-145)/(68-91) 132/86 (11/12 0444) SpO2:  [92 %-96 %] 96 % (11/12 0444) Last BM Date:  12/23/17 General:   Pleasant morbidly obese AA male appears to be in NAD, Well  developed, Well nourished, alert and cooperative Head:  Normocephalic and atraumatic. Eyes:   PEERL, EOMI. No icterus. Conjunctiva pink. Ears:  Normal auditory acuity. Neck:  Supple Throat: Oral cavity and pharynx without inflammation, swelling or lesion.  Lungs: Respirations even and unlabored. Lungs clear to auscultation bilaterally.   No wheezes, crackles, or rhonchi.  Heart: Normal S1, S2. No MRG. Regular rate and rhythm. No peripheral edema, cyanosis or pallor.  Abdomen:  Soft, nondistended, mild epigastric ttp, No rebound or guarding. Normal bowel sounds. No appreciable masses or hepatomegaly. Rectal:  Not performed.  Msk:  Symmetrical without gross deformities. Peripheral pulses intact.  Extremities:  Without edema, no deformity or joint abnormality. Normal ROM, normal sensation. Neurologic:  Alert and  oriented x4;  grossly normal neurologically.  Skin:   Dry and intact without significant lesions or rashes. Psychiatric: Demonstrates good judgement and reason without abnormal affect or behaviors.  LAB RESULTS: Recent Labs    12/22/17 0536 12/23/17 0540 12/24/17 0752  WBC 7.5 7.5 7.3  HGB 10.9* 10.9* 10.8*  HCT 35.6* 36.1* 34.7*  PLT 288 327 318   BMET Recent Labs    12/22/17 0536 12/23/17 0540 12/24/17 0621  NA 140 141 141  K 3.8 3.8 4.5  CL 109 112* 113*  CO2 23 21* 19*  GLUCOSE 147* 154* 193*  BUN 36* 32* 26*  CREATININE 2.49* 2.09* 1.71*  CALCIUM 7.7* 7.8* 7.8*   LFT Recent Labs    12/24/17 0621  PROT 5.9*  ALBUMIN 2.6*  AST 23  ALT 20  ALKPHOS 65  BILITOT 0.4    STUDIES: Vas [name] Lower Extremity Venous (dvt)  Result Date: 12/23/2017  Lower Venous Study Indications: Swelling.  Limitations: Body habitus. Performing Technologist: Maudry Mayhew MHA, RDMS, RVT, RDCS  Examination Guidelines: A complete evaluation includes B-mode imaging, spectral Doppler, color Doppler, and power Doppler as needed of all accessible portions of each vessel. Bilateral  testing is considered an integral part of a complete examination. Limited examinations for reoccurring indications may be performed as noted.  Right Venous Findings: +---------+---------------+---------+-----------+----------+-------------------+          CompressibilityPhasicitySpontaneityPropertiesSummary             +---------+---------------+---------+-----------+----------+-------------------+ CFV                                                   Unable to visualize +---------+---------------+---------+-----------+----------+-------------------+ SFJ                                                   Unable to visualize +---------+---------------+---------+-----------+----------+-------------------+ FV Prox  Full                                                             +---------+---------------+---------+-----------+----------+-------------------+ FV Mid   Full                                                             +---------+---------------+---------+-----------+----------+-------------------+  FV DistalFull                                                             +---------+---------------+---------+-----------+----------+-------------------+ POP      Full           Yes      Yes                                      +---------+---------------+---------+-----------+----------+-------------------+ PTV      Full                                                             +---------+---------------+---------+-----------+----------+-------------------+ PERO                                                  Unable to visualize +---------+---------------+---------+-----------+----------+-------------------+  Left Venous Findings: +---------+---------------+---------+-----------+----------+-------------------+          CompressibilityPhasicitySpontaneityPropertiesSummary              +---------+---------------+---------+-----------+----------+-------------------+ CFV                                                   Unable to visualize +---------+---------------+---------+-----------+----------+-------------------+ SFJ                                                   Unable to visualize +---------+---------------+---------+-----------+----------+-------------------+ FV Prox  Full                                                             +---------+---------------+---------+-----------+----------+-------------------+ FV Mid   Full                                                             +---------+---------------+---------+-----------+----------+-------------------+ FV DistalFull                                                             +---------+---------------+---------+-----------+----------+-------------------+ POP      Full           Yes  Yes                                      +---------+---------------+---------+-----------+----------+-------------------+ PTV      Full                                                             +---------+---------------+---------+-----------+----------+-------------------+ PERO                                                  Unable to visualize +---------+---------------+---------+-----------+----------+-------------------+    Summary: Right: There is no evidence of deep vein thrombosis in the lower extremity. However, portions of this examination were limited- see technologist comments above. No cystic structure found in the popliteal fossa. Left: There is no evidence of deep vein thrombosis in the lower extremity. However, portions of this examination were limited- see technologist comments above. No cystic structure found in the popliteal fossa.  *See table(s) above for measurements and observations. Electronically signed by Monica Martinez MD on 12/23/2017 at 3:52:35 PM.    Final       Impression / Plan:   Impression: 1.  Diabetic gastroparesis: Improving now day 4 of hospitalization, patient tells me he is ready to go home 2.  Abdominal pain/nausea, vomiting: With above 3.  CKD stage III 4.  Morbid obesity 5.  GERD  Plan: 1.  Today after discussion with the patient he feels as though he is improved and is ready to go home.  He would like to try a more regular diet for lunch and see how he does. 2.  Continue current medications 3.  If patient tolerates meal today we are okay with his discharge, he can follow-up with Dr. Carlean Purl as needed 4.  Please await any further recommendations from Dr. Lyndel Safe later today  Thank you for your kind consultation, we will continue to follow.  Lavone Nian [facility]  12/24/2017, 9:54 AM   Attending physician's note   I have reviewed the chart and did not examined the patient. I agree with the Advanced Practitioner's note, impression and recommendations.  Has been discharged.  42 year old with diabetic gastroparesis, CKD3, obesity and GERD-improved.  Is been able to tolerate regular food without any problems.  He will follow-up with Dr. Carlean Purl as an outpatient.  Carmell Austria, MD

## 2017-12-24 NOTE — Progress Notes (Signed)
Inpatient Diabetes Program Recommendations  AACE/ADA: New Consensus Statement on Inpatient Glycemic Control (2015)  Target Ranges:  Prepandial:   less than 140 mg/dL      Peak postprandial:   less than 180 mg/dL (1-2 hours)      Critically ill patients:  140 - 180 mg/dL   Lab Results  Component Value Date   GLUCAP 154 (H) 12/24/2017   HGBA1C 11.8 (H) 12/22/2017    Review of Glycemic Control  Long discussion with pt regarding his HgbA1C of 11.8%. Pt states he's making poor dietary choices and getting no exercise. States he is very depressed, but doesn't want to "get on a bunch of depression pills." States his head is getting in the way of taking care of himself. Talked about importance of reducing HgbA1C to 7% to reduce risk of long and short term complications. Wants to have his knee repaired, but needs to get blood sugars in control prior to surgery. Encouraged pt to find an exercise (swimming, stationary bike) that he can do, with his limitations. This will also help with depression. Discussed gradually making changes with diet - and finding things he likes that are tasty and good for him.  Needs meal coverage insulin.  Inpatient Diabetes Program Recommendations:     Add Novolog 4 units tidwc for meal coverage insulin if pt eats > 50% meal.  Psych consult for depression.  Will f/u with pt again today.  Thank you. Lorenda Peck, RD, LDN, CDE Inpatient Diabetes Coordinator [phone]

## 2017-12-31 MED FILL — POTASSIUM CL ER 10 MEQ CAP: 10 | 30 days supply | Qty: 30 | Fill #5

## 2017-12-31 MED FILL — METOCLOPRAMIDE 10 MG TABLET: 10 | 30 days supply | Qty: 90 | Fill #4

## 2017-12-31 MED FILL — ATORVASTATIN 20 MG TABLET: 20 | 30 days supply | Qty: 30 | Fill #1

## 2018-01-01 ENCOUNTER — Encounter: Payer: Self-pay | Admitting: Family Medicine

## 2018-01-01 ENCOUNTER — Ambulatory Visit: Payer: Medicare Other | Attending: Family Medicine | Admitting: Family Medicine

## 2018-01-01 ENCOUNTER — Ambulatory Visit: Payer: Self-pay | Admitting: Family Medicine

## 2018-01-01 VITALS — BP 155/82 | HR 80 | Temp 98.1°F | Ht 76.0 in | Wt 361.2 lb

## 2018-01-01 DIAGNOSIS — K3184 Gastroparesis: Secondary | ICD-10-CM | POA: Diagnosis not present

## 2018-01-01 DIAGNOSIS — E1022 Type 1 diabetes mellitus with diabetic chronic kidney disease: Secondary | ICD-10-CM | POA: Diagnosis not present

## 2018-01-01 DIAGNOSIS — Z6841 Body Mass Index (BMI) 40.0 and over, adult: Secondary | ICD-10-CM | POA: Diagnosis not present

## 2018-01-01 DIAGNOSIS — Z794 Long term (current) use of insulin: Secondary | ICD-10-CM | POA: Insufficient documentation

## 2018-01-01 DIAGNOSIS — E1021 Type 1 diabetes mellitus with diabetic nephropathy: Secondary | ICD-10-CM | POA: Insufficient documentation

## 2018-01-01 DIAGNOSIS — F329 Major depressive disorder, single episode, unspecified: Secondary | ICD-10-CM | POA: Diagnosis not present

## 2018-01-01 DIAGNOSIS — N183 Chronic kidney disease, stage 3 unspecified: Secondary | ICD-10-CM

## 2018-01-01 DIAGNOSIS — K219 Gastro-esophageal reflux disease without esophagitis: Secondary | ICD-10-CM | POA: Diagnosis not present

## 2018-01-01 DIAGNOSIS — Z79899 Other long term (current) drug therapy: Secondary | ICD-10-CM | POA: Insufficient documentation

## 2018-01-01 DIAGNOSIS — I129 Hypertensive chronic kidney disease with stage 1 through stage 4 chronic kidney disease, or unspecified chronic kidney disease: Secondary | ICD-10-CM | POA: Diagnosis not present

## 2018-01-01 DIAGNOSIS — E1043 Type 1 diabetes mellitus with diabetic autonomic (poly)neuropathy: Secondary | ICD-10-CM | POA: Diagnosis not present

## 2018-01-01 DIAGNOSIS — I10 Essential (primary) hypertension: Secondary | ICD-10-CM

## 2018-01-01 DIAGNOSIS — M7989 Other specified soft tissue disorders: Secondary | ICD-10-CM

## 2018-01-01 DIAGNOSIS — E1122 Type 2 diabetes mellitus with diabetic chronic kidney disease: Secondary | ICD-10-CM | POA: Diagnosis not present

## 2018-01-01 DIAGNOSIS — F321 Major depressive disorder, single episode, moderate: Secondary | ICD-10-CM | POA: Diagnosis not present

## 2018-01-01 DIAGNOSIS — Z9119 Patient's noncompliance with other medical treatment and regimen: Secondary | ICD-10-CM | POA: Insufficient documentation

## 2018-01-01 DIAGNOSIS — E669 Obesity, unspecified: Secondary | ICD-10-CM | POA: Diagnosis not present

## 2018-01-01 DIAGNOSIS — E1042 Type 1 diabetes mellitus with diabetic polyneuropathy: Secondary | ICD-10-CM | POA: Diagnosis not present

## 2018-01-01 LAB — GLUCOSE, POCT (MANUAL RESULT ENTRY): POC Glucose: 193 mg/dl — AB (ref 70–99)

## 2018-01-01 MED FILL — LISINOPRIL 40 MG TABLET: 40 | 30 days supply | Qty: 30 | Fill #0

## 2018-01-01 NOTE — Patient Instructions (Signed)
Edema Edema is when you have too much fluid in your body or under your skin. Edema may make your legs, feet, and ankles swell up. Swelling is also common in looser tissues, like around your eyes. This is a common condition. It gets more common as you get older. There are many possible causes of edema. Eating too much salt (sodium) and being on your feet or sitting for a long time can cause edema in your legs, feet, and ankles. Hot weather may make edema worse. Edema is usually painless. Your skin may look swollen or shiny. Follow these instructions at home:  Keep the swollen body part raised (elevated) above the level of your heart when you are sitting or lying down.  Do not sit still or stand for a long time.  Do not wear tight clothes. Do not wear garters on your upper legs.  Exercise your legs. This can help the swelling go down.  Wear elastic bandages or support stockings as told by your doctor.  Eat a low-salt (low-sodium) diet to reduce fluid as told by your doctor.  Depending on the cause of your swelling, you may need to limit how much fluid you drink (fluid restriction).  Take over-the-counter and prescription medicines only as told by your doctor. Contact a doctor if:  Treatment is not working.  You have heart, liver, or kidney disease and have symptoms of edema.  You have sudden and unexplained weight gain. Get help right away if:  You have shortness of breath or chest pain.  You cannot breathe when you lie down.  You have pain, redness, or warmth in the swollen areas.  You have heart, liver, or kidney disease and get edema all of a sudden.  You have a fever and your symptoms get worse all of a sudden. Summary  Edema is when you have too much fluid in your body or under your skin.  Edema may make your legs, feet, and ankles swell up. Swelling is also common in looser tissues, like around your eyes.  Raise (elevate) the swollen body part above the level of your  heart when you are sitting or lying down.  Follow your doctor's instructions about diet and how much fluid you can drink (fluid restriction). This information is not intended to replace advice given to you by your health care provider. Make sure you discuss any questions you have with your health care provider. Document Released: 07/18/2007 Document Revised: 02/17/2016 Document Reviewed: 02/17/2016 Elsevier Interactive Patient Education  2017 Elsevier Inc.  

## 2018-01-01 NOTE — Progress Notes (Signed)
Subjective:  Patient ID: Duane [name], male    DOB: 1976/01/24  Age: 42 y.o. MRN: [id number]  CC: Diabetes   HPI Duane [name]  is a 42 year old male with history of type 1 diabetes mellitus (A1c11.8), diabetic neuropathy, diabetic gastroparesis hypertension, stage III chronic kidney disease, depression who presents today for a follow-up visit. He was started on Prozac at his last office visit for management of depression which has stemmed from his underlying medical conditions and inability to lose weight however he never commenced it.  He was counseled by the LCSW in-house at his last visit and was referred for psychotherapy which he plans to follow through with and he states his depression is improved with this he was approved for disability.  Since his last visit he had a hospitalization for gastroparesis at [facility] from 12/21/2017 through 12/24/2017 and was treated with IV fluids and antiemetics his Lasix were held and he was advised to resume after discharge. He feels much better now and denies GI symptoms.  He has not had a gastroparesis flare many years he states.  He currently follows with gastroenterology-Dr. Carlean Purl.  His diabetes mellitus is managed by Dr. Dwyane Dee of endocrinology and he informs me his blood sugars have improved recently.  He does have some blurry vision and has an upcoming appointment with ophthalmology.   Past Medical History:  Diagnosis Date  . Depression   . Diabetes mellitus without complication [facility]) age 13   insulin requiring from the start  . Diabetic neuropathy ([place]) Dx 20110  . Diabetic proliferative retinopathy ([place]) 08/02/2017  . Gastritis   . Gastroparesis 2012   100% gastric retention on 08/2013 [facility]  . GERD (gastroesophageal reflux disease) Dx 2012  . Hypertension Dx 2012  . Obesity     Past Surgical History:  Procedure Laterality Date  . CHOLECYSTECTOMY  2013  . ESOPHAGOGASTRODUODENOSCOPY N/A 06/11/2013   Procedure:  ESOPHAGOGASTRODUODENOSCOPY (EGD);  Surgeon: [name], MD;  Location: [facility];  Service: Endoscopy;  Laterality: N/A;  . EYE SURGERY    . FLEXIBLE SIGMOIDOSCOPY N/A 06/11/2013   Procedure: FLEXIBLE SIGMOIDOSCOPY;  Surgeon: [name], MD;  Location: [facility];  Service: Endoscopy;  Laterality: N/A;  . KNEE SURGERY Left     No Known Allergies   Outpatient Medications Prior to Visit  Medication Sig Dispense Refill  . aluminum chloride (DRYSOL) 20 % external solution Apply topically at bedtime. 35 mL 1  . atorvastatin (LIPITOR) 20 MG tablet Take 1 tablet (20 mg total) by mouth daily. 30 tablet 6  . Blood Glucose Monitoring Suppl (TRUE METRIX METER) W/DEVICE KIT 1 each by Does not apply route 4 (four) times daily -  before meals and at bedtime. 1 kit ea  . diltiazem (CARTIA XT) 180 MG 24 hr capsule Take 1 capsule (180 mg total) by mouth daily. 30 capsule 6  . furosemide (LASIX) 40 MG tablet Take 1 tablet (40 mg total) by mouth daily. Hold Tomorrow's Dose and resume 12/26/17 30 tablet 6  . gabapentin (NEURONTIN) 300 MG capsule Take 1 capsule (300 mg total) by mouth at bedtime. 30 capsule 6  . glucose blood (CONTOUR NEXT TEST) test strip Use as instructed 100 each 12  . glucose blood (TRUE METRIX BLOOD GLUCOSE TEST) test strip 1 each by Other route 3 (three) times daily. 100 each 11  . Insulin Glargine (LANTUS SOLOSTAR) 100 UNIT/ML Solostar Pen Inject 35 Units into the skin 2 (two) times daily. (Patient taking differently: Inject  70 Units into the skin at bedtime. ) 90 mL 3  . insulin lispro (HUMALOG KWIKPEN) 100 UNIT/ML KiwkPen Inject 0.15 mLs (15 Units total) into the skin 3 (three) times daily. 45 mL 5  . Insulin Pen Needle 31G X 8 MM MISC Inject 1 each into the skin 4 (four) times daily. Check blood sugar TID and QHS 120 each 11  . lisinopril (PRINIVIL,ZESTRIL) 40 MG tablet Take 1 tablet (40 mg total) by mouth daily. HOLD tomorrow's dose and resume 12/26/17 30 tablet 0  .  metoCLOPramide (REGLAN) 10 MG tablet TAKE 1 TABLET BY MOUTH 3 TIMES DAILY BEFORE MEALS AS NEEDED (Patient taking differently: Take 10 mg by mouth every 8 (eight) hours as needed for nausea. TAKE 1 TABLET BY MOUTH 3 TIMES DAILY BEFORE MEALS AS NEEDED) 90 tablet 5  . metoprolol tartrate (LOPRESSOR) 50 MG tablet Take 50 mg by mouth 2 (two) times daily.     . pantoprazole (PROTONIX) 40 MG tablet Take 1 tablet (40 mg total) by mouth daily. 30 tablet 5  . Peppermint Oil (IBGARD) 90 MG CPCR Take 2 capsules by mouth 3 (three) times daily as needed. 16 capsule 0  . potassium chloride (MICRO-K) 10 MEQ CR capsule TAKE 1 TABLET BY MOUTH DAILY. (Patient taking differently: Take 10 mEq by mouth daily. ) 30 capsule 5  . promethazine (PHENERGAN) 25 MG tablet TAKE 1 TABLET BY MOUTH EVERY 8 HOURS AS NEEDED FOR NAUSEA OR VOMITING. (Patient taking differently: Take 25 mg by mouth every 6 (six) hours as needed for nausea or vomiting. ) 60 tablet 0  . sucralfate (CARAFATE) 1 g tablet TAKE 1 TABLET BY MOUTH 4 TIMES DAILY WITH MEALS AND AT BEDTIME. (Patient taking differently: Take 1 g by mouth 4 (four) times daily. ) 120 tablet 2  . tadalafil 2.5 MG TABS Take 1 tablet (2.5 mg total) by mouth daily. 30 tablet 5  . TRUEPLUS LANCETS 28G MISC 1 each by Does not apply route 4 (four) times daily -  before meals and at bedtime. 100 each 12   No facility-administered medications prior to visit.     ROS Review of Systems  Constitutional: Negative for activity change and appetite change.  HENT: Negative for sinus pressure and sore throat.   Eyes: Negative for visual disturbance.  Respiratory: Negative for cough, chest tightness and shortness of breath.   Cardiovascular: Negative for chest pain and leg swelling.  Gastrointestinal: Negative for abdominal distention, abdominal pain, constipation and diarrhea.  Endocrine: Negative.   Genitourinary: Negative for dysuria.  Musculoskeletal: Negative for joint swelling and myalgias.    Skin: Negative for rash.  Allergic/Immunologic: Negative.   Neurological: Negative for weakness, light-headedness and numbness.  Psychiatric/Behavioral: Negative for dysphoric mood and suicidal ideas.    Objective:  BP (!) 155/82   Pulse 80   Temp 98.1 F (36.7 C) (Oral)   Ht 6' 4"  (1.93 m)   Wt (!) 361 lb 3.2 oz (163.8 kg)   SpO2 99%   BMI 43.97 kg/m   BP/Weight 01/01/2018 12/24/2017 53/07/1441  Systolic BP 154 008 -  Diastolic BP 82 80 -  Wt. (Lbs) 361.2 - 355  BMI 43.97 - 48.15      Physical Exam  Constitutional: He is oriented to person, place, and time. He appears well-developed and well-nourished.  Obese  HENT:  Right Ear: External ear normal.  Left Ear: External ear normal.  Mouth/Throat: Oropharynx is clear and moist.  Cardiovascular: Normal rate, normal heart sounds and  intact distal pulses.  No murmur heard. Pulmonary/Chest: Effort normal and breath sounds normal. He has no wheezes. He has no rales. He exhibits no tenderness.  Abdominal: Soft. Bowel sounds are normal. He exhibits no distension and no mass. There is no tenderness.  Musculoskeletal: Normal range of motion.  3+ nonpitting right lower extremity edema, 2+ nonpitting left lower extremity edema  Neurological: He is alert and oriented to person, place, and time.  Skin: Skin is warm and dry.  Psychiatric: He has a normal mood and affect.     CMP Latest Ref Rng & Units 12/24/2017 12/23/2017 12/22/2017  Glucose 70 - 99 mg/dL 193(H) 154(H) 147(H)  BUN 6 - 20 mg/dL 26(H) 32(H) 36(H)  Creatinine 0.61 - 1.24 mg/dL 1.71(H) 2.09(H) 2.49(H)  Sodium 135 - 145 mmol/L 141 141 140  Potassium 3.5 - 5.1 mmol/L 4.5 3.8 3.8  Chloride 98 - 111 mmol/L 113(H) 112(H) 109  CO2 22 - 32 mmol/L 19(L) 21(L) 23  Calcium 8.9 - 10.3 mg/dL 7.8(L) 7.8(L) 7.7(L)  Total Protein 6.5 - 8.1 g/dL 5.9(L) 6.2(L) -  Total Bilirubin 0.3 - 1.2 mg/dL 0.4 0.3 -  Alkaline Phos 38 - 126 U/L 65 62 -  AST 15 - 41 U/L 23 21 -  ALT 0 - 44  U/L 20 22 -    Lab Results  Component Value Date   HGBA1C 11.8 (H) 12/22/2017    Assessment & Plan:   1. Type 1 diabetes mellitus with diabetic polyneuropathy (HCC) Uncontrolled with A1c of 11.8 Continue regimen as per endocrine Compliant with lifestyle management diabetic diet, exercise  - POCT glucose (manual entry)  2. CKD stage 3 secondary to diabetes ([place]) Combination of hypertensive and diabetic nephropathy Avoid nephrotoxins Currently followed by [facility] He informs me his nephrologist was concerned about hyperkalemia; I will send of labs and if indicated discontinue potassium as he already takes lisinopril - CMP14+EGFR  3. Gastroparesis Stable Continue antiemetics Followed by GI-Dr. Carlean Purl  4. Leg swelling Continue Lasix, elevate feet, compression stockings  5. Current moderate episode of major depressive disorder without prior episode ([place]) Secondary to underlying medical conditions-he feels he is stable at this time and is doing better especially since approval of his disability. Prescribed Prozac which he does not want to take at this time but would like to try counseling Advised to ensure he obtains an appointment for this and we can resume his SSRI if needed  6.  Hypertension Blood pressure is elevated Advised to pick up lisinopril prescription from the pharmacy No orders of the defined types were placed in this encounter.  7. Obesity Inability to lose weight has also been due to his noncompliance with lifestyle changes as a result of underlying depression Encouraged him to reduce portion sizes, increase physical activity as he is not very active. He would like to look up the name of an OTC weight loss medication and get back to the clinic with benign. . Follow-up: Return in about 3 months (around 04/03/2018) for Follow-up of chronic medical conditions.   Charlott Rakes MD

## 2018-01-02 DIAGNOSIS — E669 Obesity, unspecified: Secondary | ICD-10-CM | POA: Insufficient documentation

## 2018-01-02 LAB — CMP14+EGFR
ALBUMIN: 3.4 g/dL — AB (ref 3.5–5.5)
ALK PHOS: 109 IU/L (ref 39–117)
ALT: 26 IU/L (ref 0–44)
AST: 16 IU/L (ref 0–40)
Albumin/Globulin Ratio: 1.1 — ABNORMAL LOW (ref 1.2–2.2)
BUN / CREAT RATIO: 9 (ref 9–20)
BUN: 17 mg/dL (ref 6–24)
CO2: 23 mmol/L (ref 20–29)
CREATININE: 1.85 mg/dL — AB (ref 0.76–1.27)
Calcium: 9 mg/dL (ref 8.7–10.2)
Chloride: 103 mmol/L (ref 96–106)
GFR calc non Af Amer: 44 mL/min/{1.73_m2} — ABNORMAL LOW (ref >59)
GFR, EST AFRICAN AMERICAN: 51 mL/min/{1.73_m2} — AB (ref >59)
GLOBULIN, TOTAL: 3 g/dL (ref 1.5–4.5)
Glucose: 177 mg/dL — ABNORMAL HIGH (ref 65–99)
Potassium: 4.3 mmol/L (ref 3.5–5.2)
Sodium: 141 mmol/L (ref 134–144)
Total Protein: 6.4 g/dL (ref 6.0–8.5)

## 2018-01-07 ENCOUNTER — Encounter (INDEPENDENT_AMBULATORY_CARE_PROVIDER_SITE_OTHER): Payer: Medicare Other | Admitting: Ophthalmology

## 2018-01-07 NOTE — Progress Notes (Signed)
Patient ID: Duane [name], male   DOB: Feb 24, 1975, 42 y.o.   MRN: [id number]           Reason for Appointment : Follow-up for Type 1 Diabetes  History of Present Illness          Diagnosis: Type 1 diabetes mellitus, date of diagnosis: 04/2003         Previous history:  He was initially diagnosed with symptoms of high blood sugars needing hospitalization with a glucose of about 1100 Initially sent home on insulin and he thinks he was able to get off insulin for about a year after this Subsequently went back on insulin  Previous records are not available as he was under medical care in [place] Not clear what level of control he has had in the past   Recent history:  His A1c is recently 11.8 done in the hospital, previously higher  INSULIN regimen is described as: Lantus 35 units twice daily Humalog with meals 15  units before eating   CURRENT management, blood sugar patterns and problems identified:   He was told to split his Lantus to twice a day  Although he has done that not clear if his blood sugars are much better than before  He is checking his blood sugars with the contour meter but he did not bring this  Because of his gastroparesis he is not eating consistent meals and may only 1 or 2 formal meals but may have snacks otherwise  Higher with that he may end up eating a lot of carbohydrate without much protein with snacks being noodles or other starchy foods  He was told to see the dietitian but he has not done so  He also told to start counting carbohydrates with the calorie [employer] Pace app on his phone but he has not done so  Currently taking only a flight dose of 15 units of Humalog with every meal  With this however his blood sugars are still well over 200 after eating reportedly  He is concerned about his weight gain  Had not been able to do any exercise because of knee pain and neuropathy     Glucose monitoring:  done 3 times a day         Glucometer: Contour  next    Blood Glucose readings from recall as follows:     PRE-MEAL Fasting Lunch Dinner Bedtime Overall  Glucose range: 160-180      Mean/median:        POST-MEAL PC Breakfast PC Lunch PC Dinner  Glucose range:     Mean/median:  200+ 260 ?    Hypoglycemia: none        Self-care: The diet that the patient has been following is: None  Meals: 2-3 meals per day. Eating and  8 pm; 2 am occasionally and at 8 am      Breakfast is sometimes skipped, otherwise eggs or breakfast sandwiches.      Exercise: 3 days a week , aerobic and weightlifting for up to one hour in the afternoon         Dietician consultation: Most recent: Years ago.         CDE consultation: None Retinal exam: Most recent: years ago    Diabetes labs:   Lab Results  Component Value Date   HGBA1C 11.8 (H) 12/22/2017   HGBA1C 12.7 (A) 11/20/2017   HGBA1C 13.0 (H) 08/02/2017   Lab Results  Component Value Date   MICROALBUR 11.3 (H) 09/13/2014  LDLCALC 65 05/17/2017   CREATININE 1.85 (H) 01/01/2018    Allergies as of 01/08/2018   No Known Allergies     Medication List        Accurate as of 01/08/18 11:59 PM. Always use your most recent med list.          aluminum chloride 20 % external solution Commonly known as:  DRYSOL Apply topically at bedtime.   atorvastatin 20 MG tablet Commonly known as:  LIPITOR Take 1 tablet (20 mg total) by mouth daily.   diltiazem 180 MG 24 hr capsule Commonly known as:  CARDIZEM CD Take 1 capsule (180 mg total) by mouth daily.   furosemide 40 MG tablet Commonly known as:  LASIX Take 1 tablet (40 mg total) by mouth daily. Hold Tomorrow's Dose and resume 12/26/17   gabapentin 300 MG capsule Commonly known as:  NEURONTIN Take 1 capsule (300 mg total) by mouth at bedtime.   glucose blood test strip 1 each by Other route 3 (three) times daily.   glucose blood test strip Use as instructed   Insulin Glargine 100 UNIT/ML Solostar Pen Commonly known as:   LANTUS Inject 35 Units into the skin 2 (two) times daily.   insulin lispro 100 UNIT/ML KiwkPen Commonly known as:  HUMALOG Inject 0.15 mLs (15 Units total) into the skin 3 (three) times daily.   Insulin Pen Needle 31G X 8 MM Misc Inject 1 each into the skin 4 (four) times daily. Check blood sugar TID and QHS   lisinopril 40 MG tablet Commonly known as:  PRINIVIL,ZESTRIL Take 1 tablet (40 mg total) by mouth daily. HOLD tomorrow's dose and resume 12/26/17   metoCLOPramide 10 MG tablet Commonly known as:  REGLAN TAKE 1 TABLET BY MOUTH 3 TIMES DAILY BEFORE MEALS AS NEEDED   metoprolol tartrate 50 MG tablet Commonly known as:  LOPRESSOR Take 50 mg by mouth 2 (two) times daily.   pantoprazole 40 MG tablet Commonly known as:  PROTONIX Take 1 tablet (40 mg total) by mouth daily.   Peppermint Oil 90 MG Cpcr Take 2 capsules by mouth 3 (three) times daily as needed.   potassium chloride 10 MEQ CR capsule Commonly known as:  MICRO-K TAKE 1 TABLET BY MOUTH DAILY.   promethazine 25 MG tablet Commonly known as:  PHENERGAN TAKE 1 TABLET BY MOUTH EVERY 8 HOURS AS NEEDED FOR NAUSEA OR VOMITING.   sucralfate 1 g tablet Commonly known as:  CARAFATE TAKE 1 TABLET BY MOUTH 4 TIMES DAILY WITH MEALS AND AT BEDTIME.   Tadalafil 2.5 MG Tabs Take 1 tablet (2.5 mg total) by mouth daily.   TRUE METRIX METER w/Device Kit 1 each by Does not apply route 4 (four) times daily -  before meals and at bedtime.   TRUEPLUS LANCETS 28G Misc 1 each by Does not apply route 4 (four) times daily -  before meals and at bedtime.       Allergies: No Known Allergies  Past Medical History:  Diagnosis Date  . Depression   . Diabetes mellitus without complication [facility]) age 35   insulin requiring from the start  . Diabetic neuropathy ([place]) Dx 20110  . Diabetic proliferative retinopathy ([place]) 08/02/2017  . Gastritis   . Gastroparesis 2012   100% gastric retention on 08/2013 [facility]  . GERD (gastroesophageal  reflux disease) Dx 2012  . Hypertension Dx 2012  . Obesity     Past Surgical History:  Procedure Laterality Date  . CHOLECYSTECTOMY  2013  .  ESOPHAGOGASTRODUODENOSCOPY N/A 06/11/2013   Procedure: ESOPHAGOGASTRODUODENOSCOPY (EGD);  Surgeon: [name], MD;  Location: [facility];  Service: Endoscopy;  Laterality: N/A;  . EYE SURGERY    . FLEXIBLE SIGMOIDOSCOPY N/A 06/11/2013   Procedure: FLEXIBLE SIGMOIDOSCOPY;  Surgeon: [name], MD;  Location: [facility];  Service: Endoscopy;  Laterality: N/A;  . KNEE SURGERY Left     Family History  Problem Relation Age of Onset  . Diabetes Father   . Diabetes Maternal Grandmother   . Diabetes Paternal Grandmother   . Colon cancer Neg Hx   . Stomach cancer Neg Hx     Social History:  reports that he has never smoked. He has never used smokeless tobacco. He reports that he does not drink alcohol or use drugs.     Review of Systems  DIABETES complications: Gastroparesis and recurrent diarrhea, severe peripheral neuropathy with sensory loss Retinopathy, proliferative Erectile dysfunction Diabetic nephropathy with proteinuria and mild renal insufficiency      HYPERLIPIDEMIA:  Treated with atorvastatin  Lab Results  Component Value Date   CHOL 138 05/17/2017   HDL 29 (L) 05/17/2017   LDLCALC 65 05/17/2017   TRIG 221 (H) 05/17/2017   CHOLHDL 4.8 05/17/2017   HYPERTENSION currently being treated with diltiazem, amlodipine and lisinopril 40 mg He thinks that he is having more edema  CRF: Followed by nephrologist   Lab Results  Component Value Date   CREATININE 1.85 (H) 01/01/2018   CREATININE 1.71 (H) 12/24/2017   CREATININE 2.09 (H) 12/23/2017    NEUROPATHY: Currently taking 300 mg gabapentin for pain control  LABS:  No visits with results within 1 Week(s) from this visit.  Latest known visit with results is:  Office Visit on 01/01/2018  Component Date Value Ref Range Status  . POC Glucose 01/01/2018 193* 70 -  99 mg/dl Final  . Glucose 01/01/2018 177* 65 - 99 mg/dL Final  . BUN 01/01/2018 17  6 - 24 mg/dL Final  . Creatinine, Ser 01/01/2018 1.85* 0.76 - 1.27 mg/dL Final  . GFR calc non Af Amer 01/01/2018 44* >59 mL/min/1.73 Final  . GFR calc Af Amer 01/01/2018 51* >59 mL/min/1.73 Final  . BUN/Creatinine Ratio 01/01/2018 9  9 - 20 Final  . Sodium 01/01/2018 141  134 - 144 mmol/L Final  . Potassium 01/01/2018 4.3  3.5 - 5.2 mmol/L Final  . Chloride 01/01/2018 103  96 - 106 mmol/L Final  . CO2 01/01/2018 23  20 - 29 mmol/L Final  . Calcium 01/01/2018 9.0  8.7 - 10.2 mg/dL Final  . Total Protein 01/01/2018 6.4  6.0 - 8.5 g/dL Final  . Albumin 01/01/2018 3.4* 3.5 - 5.5 g/dL Final  . Globulin, Total 01/01/2018 3.0  1.5 - 4.5 g/dL Final  . Albumin/Globulin Ratio 01/01/2018 1.1* 1.2 - 2.2 Final  . Bilirubin Total 01/01/2018 <0.2  0.0 - 1.2 mg/dL Final  . Alkaline Phosphatase 01/01/2018 109  39 - 117 IU/L Final  . AST 01/01/2018 16  0 - 40 IU/L Final  . ALT 01/01/2018 26  0 - 44 IU/L Final    Physical Examination:  BP 112/72 (BP Location: Left Arm, Patient Position: Sitting, Cuff Size: Normal)   Pulse 92   Ht 6' 4"  (1.93 m)   Wt (!) 356 lb 6.4 oz (161.7 kg)   SpO2 96%   BMI 43.38 kg/m       ASSESSMENT:  Diabetes type 1, long-standing with obesity  He has had  persistent poor control associated  with multiple complications   V7Q is 46.9 which is relatively better than before He has done better monitoring and now taking twice daily basal insulin compared to once a day Lantus previously   Problems identified:  Although his fasting readings are relatively better he appears to have high nonfasting readings This may be both related to inadequate basal and bolus insulin during the day He has not had his consultation with dietitian which would be important for better management and counting instruction Some degree of insulin resistance continues with the any evidence of weight  loss  HYPERTENSION and hyperlipidemia: Fairly well controlled  PLAN:    He will increase his Lantus at least 5 units in the morning to improve daytime blood sugars  We will need to take his NovoLog with every meal and significant carbohydrate intake  He will try to estimate his total carbohydrates and divide by 3 to get his insulin dose for meal coverage, this should get him at least 5 units coverage for each carbohydrate each serving and more for higher fat intake  Also needs to add extra insulin if blood sugar is over 100 with a 2: 50 ratio  Discussed how to adjust his insulin based on above factors  Need to check blood sugars after meals more consistently including after supper  Consultation with dietitian to be arranged  Consider fasting C-peptide on the next visit and if he is able to demonstrate good compliance may consider insulin pump   Patient Instructions  Lantus 40 in am and 35 in pm  Calorie [employer] Pace app to look up Carbs  Divide total Carbs by factor of 3 each time  Add extra 2 units per 50 points over 100       Duane [name] 01/09/2018, 5:06 PM   Note: This note was prepared with Dragon voice recognition system technology. Any transcriptional errors that result from this process are unintentional.

## 2018-01-08 ENCOUNTER — Encounter: Payer: Self-pay | Admitting: Endocrinology

## 2018-01-08 ENCOUNTER — Ambulatory Visit (INDEPENDENT_AMBULATORY_CARE_PROVIDER_SITE_OTHER): Payer: Medicare Other | Admitting: Endocrinology

## 2018-01-08 VITALS — BP 112/72 | HR 92 | Ht 76.0 in | Wt 356.4 lb

## 2018-01-08 DIAGNOSIS — E1042 Type 1 diabetes mellitus with diabetic polyneuropathy: Secondary | ICD-10-CM

## 2018-01-08 DIAGNOSIS — Z6841 Body Mass Index (BMI) 40.0 and over, adult: Secondary | ICD-10-CM | POA: Diagnosis not present

## 2018-01-08 NOTE — Patient Instructions (Addendum)
Lantus 40 in am and 35 in pm  Calorie [employer] Pace app to look up Carbs  Divide total Carbs by factor of 3 each time  Add extra 2 units per 50 points over 100

## 2018-01-20 ENCOUNTER — Other Ambulatory Visit: Payer: Self-pay | Admitting: Family Medicine

## 2018-01-20 DIAGNOSIS — E1042 Type 1 diabetes mellitus with diabetic polyneuropathy: Secondary | ICD-10-CM

## 2018-01-20 MED FILL — $LANTUS SOLOSTAR 100 UNITS/: 100 | 85 days supply | Qty: 60 | Fill #4

## 2018-01-20 MED FILL — GABAPENTIN 300 MG CAPSULE: 300 | 30 days supply | Qty: 30 | Fill #3

## 2018-01-20 MED FILL — PANTOPRAZOLE SOD DR 40 MG T: 40 | 30 days supply | Qty: 30 | Fill #2

## 2018-01-20 MED FILL — !HUMALOG 100 UNITS/ML KWIKP: 100 | 33 days supply | Qty: 15 | Fill #0

## 2018-01-20 MED FILL — CARTIA XT 180 MG CAPSULE SA: 180 | 30 days supply | Qty: 30 | Fill #1

## 2018-01-21 ENCOUNTER — Encounter (INDEPENDENT_AMBULATORY_CARE_PROVIDER_SITE_OTHER): Payer: Medicare Other | Admitting: Ophthalmology

## 2018-01-27 ENCOUNTER — Telehealth: Payer: Self-pay | Admitting: Internal Medicine

## 2018-01-27 MED ORDER — RIFAXIMIN 550 MG PO TABS: 550.0000 mg | ORAL_TABLET | Freq: Three times a day (TID) | ORAL | 0 refills | Status: DC

## 2018-01-27 MED FILL — SUCRALFATE 1 GM TABLET: 1 | 30 days supply | Qty: 120 | Fill #2

## 2018-01-27 MED FILL — METOCLOPRAMIDE 10 MG TABLET: 10 | 30 days supply | Qty: 90 | Fill #5

## 2018-01-27 MED FILL — ATORVASTATIN 20 MG TABLET: 20 | 30 days supply | Qty: 30 | Fill #2

## 2018-01-27 MED FILL — !XIFAXAN 550 MG TABLET: 550 | 14 days supply | Qty: 42 | Fill #0

## 2018-01-27 NOTE — Telephone Encounter (Signed)
Patient is asking for a refill of xifaxan. He is having almost daily diarrhea.  Ok to refill?

## 2018-01-27 NOTE — Telephone Encounter (Signed)
Patient notified and rx sent. 

## 2018-01-27 NOTE — Telephone Encounter (Signed)
Pt called in wanting dr. Lorayne Bender to prescibe him Xifaxan

## 2018-01-27 NOTE — Telephone Encounter (Signed)
Refill 550 mg tid x 14 d no refill

## 2018-02-03 ENCOUNTER — Other Ambulatory Visit: Payer: Self-pay | Admitting: Family Medicine

## 2018-02-03 ENCOUNTER — Telehealth: Payer: Self-pay | Admitting: Internal Medicine

## 2018-02-03 ENCOUNTER — Other Ambulatory Visit: Payer: Self-pay

## 2018-02-03 DIAGNOSIS — R600 Localized edema: Secondary | ICD-10-CM

## 2018-02-03 MED ORDER — LISINOPRIL 40 MG PO TABS: 40.0000 mg | ORAL_TABLET | Freq: Every day | ORAL | 3 refills | Status: DC

## 2018-02-03 MED FILL — LISINOPRIL 40 MG TABLET: 40 | 30 days supply | Qty: 30 | Fill #0

## 2018-02-03 MED FILL — POTASSIUM CHLORIDE ER 10 ME: 10 | 30 days supply | Qty: 30 | Fill #0

## 2018-02-03 NOTE — Telephone Encounter (Signed)
Patient with gas, bloating, and diarrhea.  He is still taking xifaxan, but no real improvement.  He is advised to add imodium prn.  He will come in and see Nicoletta Ba PA on 02/11/18

## 2018-02-03 NOTE — Telephone Encounter (Signed)
Pt has been having diarrhea for several weeks, needs some advise on what to do.

## 2018-02-03 NOTE — Telephone Encounter (Signed)
Refilled lisinopril

## 2018-02-11 ENCOUNTER — Other Ambulatory Visit (INDEPENDENT_AMBULATORY_CARE_PROVIDER_SITE_OTHER): Payer: Medicare Other

## 2018-02-11 ENCOUNTER — Ambulatory Visit (INDEPENDENT_AMBULATORY_CARE_PROVIDER_SITE_OTHER): Payer: Medicare Other | Admitting: Physician Assistant

## 2018-02-11 ENCOUNTER — Ambulatory Visit: Payer: Self-pay | Admitting: Physician Assistant

## 2018-02-11 ENCOUNTER — Encounter: Payer: Self-pay | Admitting: Physician Assistant

## 2018-02-11 VITALS — BP 134/84 | HR 76 | Ht 76.0 in | Wt 361.4 lb

## 2018-02-11 DIAGNOSIS — Z8619 Personal history of other infectious and parasitic diseases: Secondary | ICD-10-CM | POA: Diagnosis not present

## 2018-02-11 DIAGNOSIS — R197 Diarrhea, unspecified: Secondary | ICD-10-CM

## 2018-02-11 DIAGNOSIS — E1343 Other specified diabetes mellitus with diabetic autonomic (poly)neuropathy: Secondary | ICD-10-CM | POA: Diagnosis not present

## 2018-02-11 LAB — IGA: IgA: 171 mg/dL (ref 68–378)

## 2018-02-11 LAB — HIGH SENSITIVITY CRP: CRP, High Sensitivity: 8.56 mg/L — ABNORMAL HIGH (ref 0.000–5.000)

## 2018-02-11 LAB — SEDIMENTATION RATE: Sed Rate: 38 mm/hr — ABNORMAL HIGH (ref 0–15)

## 2018-02-11 NOTE — Patient Instructions (Signed)
Your provider has requested that you go to the basement level for lab work before leaving today. Press "B" on the elevator. The lab is located at the first door on the left as you exit the elevator. Finish the current courses of Xifaxan. Start Levsin SL tablets. 30 min before each meal.  Take 2 imodium each morningl.

## 2018-02-12 LAB — IGG: IgG (Immunoglobin G), Serum: 1219 mg/dL (ref 600–1640)

## 2018-02-13 ENCOUNTER — Encounter: Payer: Self-pay | Admitting: Physician Assistant

## 2018-02-13 ENCOUNTER — Other Ambulatory Visit: Payer: Medicare Other

## 2018-02-13 ENCOUNTER — Other Ambulatory Visit: Payer: Self-pay

## 2018-02-13 ENCOUNTER — Other Ambulatory Visit: Payer: Self-pay | Admitting: Physician Assistant

## 2018-02-13 DIAGNOSIS — R197 Diarrhea, unspecified: Secondary | ICD-10-CM

## 2018-02-13 DIAGNOSIS — Z8619 Personal history of other infectious and parasitic diseases: Secondary | ICD-10-CM | POA: Diagnosis not present

## 2018-02-13 MED ORDER — HYOSCYAMINE SULFATE 0.125 MG PO TABS: 0.1250 mg | ORAL_TABLET | Freq: Three times a day (TID) | ORAL | 4 refills | Status: DC | PRN

## 2018-02-13 MED FILL — HYOSCYAMINE SULF 0.125 MG T: 0.125 | 30 days supply | Qty: 90 | Fill #0

## 2018-02-13 NOTE — Telephone Encounter (Signed)
-----   Message from Alfredia Ferguson, PA-C sent at 02/13/2018 10:33 AM EST ----- Regarding: meds Beth, I saw this pt on Tuesday - he came to office today to drop off stool specimen and says we did not send his  Rx to pharmacy - please send rx for Levsin 0.125 mg po tid  20 min before meals #90 /4 refills .. thank you

## 2018-02-13 NOTE — Telephone Encounter (Signed)
Prescription sent

## 2018-02-13 NOTE — Progress Notes (Addendum)
Subjective:    Patient ID: Duane [name], male    DOB: 09/07/1975, 43 y.o.   MRN: [id number]  HPI Duane [name] is a pleasant 43 year old African-American male, known to Dr. Carlean Purl who comes in today with complaint of severe diarrhea.  Patient has history of insulin-dependent diabetes, and diabetic gastroparesis, and also felt to probably have component of diabetic enteropathy.  He is status post cholecystectomy in 2013 and has had prior history of C. difficile. He had flexible sigmoidoscopy done in 2015 for diarrhea which was normal, no biopsies were done. Patient has been treated with Xifaxan with good response in the past and was last treated in August 2019. Today patient says he is been having severe diarrhea over the past 2 to 3 weeks worse than his usual he says he rarely ever has any formed stools he has had episodes for quite a while of periodic diarrhea that may last for several hours.  He has you tried Kaopectate and Imodium, is not sure which one to stick with and not sure how much it helps.  Currently having diarrhea no matter what he eats.  He had called last week and is now taking another course of Xifaxan but thus far has not made much difference. He has not had any other recent antibiotics though did take a course for an infected toe in the summer.  He had a more recent hospitalization with a gastroparesis.  Line He says yesterday he had diarrhea all day long more than 10 bowel movements, along with belching and indigestion.  No bleeding, no fever or chills.  He generally avoids lactose. He has not been started on any new diabetic meds. He had tried Questran in the past without any benefit  Review of Systems Pertinent positive and negative review of systems were noted in the above HPI section.  All other review of systems was otherwise negative.  Outpatient Encounter Medications as of 02/11/2018  Medication Sig  . aluminum chloride (DRYSOL) 20 % external solution Apply topically at  bedtime.  Marland Kitchen atorvastatin (LIPITOR) 20 MG tablet Take 1 tablet (20 mg total) by mouth daily.  . Blood Glucose Monitoring Suppl (TRUE METRIX METER) W/DEVICE KIT 1 each by Does not apply route 4 (four) times daily -  before meals and at bedtime.  Marland Kitchen diltiazem (CARTIA XT) 180 MG 24 hr capsule Take 1 capsule (180 mg total) by mouth daily.  . furosemide (LASIX) 40 MG tablet Take 1 tablet (40 mg total) by mouth daily. Hold Tomorrow's Dose and resume 12/26/17  . gabapentin (NEURONTIN) 300 MG capsule Take 1 capsule (300 mg total) by mouth at bedtime.  Marland Kitchen glucose blood (CONTOUR NEXT TEST) test strip Use as instructed  . glucose blood (TRUE METRIX BLOOD GLUCOSE TEST) test strip 1 each by Other route 3 (three) times daily.  Marland Kitchen HUMALOG KWIKPEN 100 UNIT/ML KwikPen INJECT 15 UNITS INTO THE SKIN 3 TIMES DAILY.  Marland Kitchen Insulin Glargine (LANTUS SOLOSTAR) 100 UNIT/ML Solostar Pen Inject 35 Units into the skin 2 (two) times daily.  . Insulin Pen Needle 31G X 8 MM MISC Inject 1 each into the skin 4 (four) times daily. Check blood sugar TID and QHS  . lisinopril (PRINIVIL,ZESTRIL) 40 MG tablet Take 1 tablet (40 mg total) by mouth daily.  . metoCLOPramide (REGLAN) 10 MG tablet TAKE 1 TABLET BY MOUTH 3 TIMES DAILY BEFORE MEALS AS NEEDED (Patient taking differently: Take 10 mg by mouth every 8 (eight) hours as needed for nausea. TAKE 1 TABLET BY  MOUTH 3 TIMES DAILY BEFORE MEALS AS NEEDED)  . pantoprazole (PROTONIX) 40 MG tablet Take 1 tablet (40 mg total) by mouth daily.  Marland Kitchen Peppermint Oil (IBGARD) 90 MG CPCR Take 2 capsules by mouth 3 (three) times daily as needed.  . potassium chloride (MICRO-K) 10 MEQ CR capsule Take 1 capsule (10 mEq total) by mouth daily.  . promethazine (PHENERGAN) 25 MG tablet TAKE 1 TABLET BY MOUTH EVERY 8 HOURS AS NEEDED FOR NAUSEA OR VOMITING. (Patient taking differently: Take 25 mg by mouth every 6 (six) hours as needed for nausea or vomiting. )  . rifaximin (XIFAXAN) 550 MG TABS tablet Take 1 tablet (550  mg total) by mouth 3 (three) times daily.  . sucralfate (CARAFATE) 1 g tablet TAKE 1 TABLET BY MOUTH 4 TIMES DAILY WITH MEALS AND AT BEDTIME. (Patient taking differently: Take 1 g by mouth 4 (four) times daily. )  . tadalafil 2.5 MG TABS Take 1 tablet (2.5 mg total) by mouth daily.  . TRUEPLUS LANCETS 28G MISC 1 each by Does not apply route 4 (four) times daily -  before meals and at bedtime.  . [DISCONTINUED] metoprolol tartrate (LOPRESSOR) 50 MG tablet Take 50 mg by mouth 2 (two) times daily.    No facility-administered encounter medications on file as of 02/11/2018.    No Known Allergies Patient Active Problem List   Diagnosis Date Noted  . Obesity 01/02/2018  . Gastroparesis 12/21/2017  . CKD stage 3 secondary to diabetes ([place]) 12/21/2017  . Depression 11/20/2017  . Diabetic enteropathy ([place]) likely 10/11/2017  . Diabetic proliferative retinopathy ([place]) 08/02/2017  . Insomnia 05/17/2017  . Pedal edema 10/16/2016  . Chronic kidney disease 10/16/2016  . Vitamin D deficiency 08/21/2016  . Leg swelling 08/21/2016  . Excessive sweating 08/21/2016  . Status post cholecystectomy 10/16/2015  . Onychomycosis of toenail 04/22/2015  . Erectile dysfunction 04/22/2015  . Diabetic gastroparesis associated with type 1 diabetes mellitus ([place]) 01/01/2015  . Skin tag 09/17/2014  . Pain and swelling of left knee 09/13/2014  . Chronic diarrhea 09/13/2014  . Benign paroxysmal positional vertigo 07/15/2014  . MDD (major depressive disorder), single episode, severe , no psychosis ([place]) 05/19/2014  . GERD (gastroesophageal reflux disease) 05/05/2014  . Type I diabetes mellitus with complication, uncontrolled ([place])   . Diabetic neuropathy ([place]) 08/28/2013  . DM type 1 (diabetes mellitus, type 1) ([place]) 01/09/2013  . HTN (hypertension) 01/09/2013   Social History   Socioeconomic History  . Marital status: Single    Spouse name: Not on file  . Number of children: Not on file  . Years of  education: Not on file  . Highest education level: Not on file  Occupational History  . 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  difficult to work due to n/v.   Social Needs  . Financial resource strain: Not on file  . Food insecurity:    Worry: Not on file    Inability: Not on file  . Transportation needs:    Medical: Not on file    Non-medical: Not on file  Tobacco Use  . Smoking status: Never Smoker  . Smokeless tobacco: Never Used  Substance and Sexual Activity  . Alcohol use: No  . Drug use: No  . Sexual activity: Not on file  Lifestyle  . Physical activity:    Days per week: Not on file    Minutes per session: Not on file  . Stress: Not on file  Relationships  .  Social connections:    Talks on phone: Not on file    Gets together: Not on file    Attends religious service: Not on file    Active member of club or organization: Not on file    Attends meetings of clubs or organizations: Not on file    Relationship status: Not on file  . Intimate partner violence:    Fear of current or ex partner: Not on file    Emotionally abused: Not on file    Physically abused: Not on file    Forced sexual activity: Not on file  Other Topics Concern  . Not on file  Social History Narrative   He is single and a disabled security guard   1 son at least, I do not know if he has other children   No alcohol tobacco or drug use reported no smoking    Mr. Blincoe's family history includes Diabetes in his father, maternal grandmother, and paternal grandmother.      Objective:    Vitals:   02/11/18 1140  BP: 134/84  Pulse: 76    Physical Exam; well-developed African-American male in no acute distress, pleasant, height 6 foot 4, BMI 43.9, weight 361.  HEENT; nontraumatic normocephalic EOMI PERRLA sclera anicteric oral mucosa moist, Cardiovascular; regular rate and rhythm with S1-S2, Pulmonary clear bilaterally, Abdomen; large, soft, there is no focal tenderness, no  guarding or rebound, no palpable mass or hepatosplenomegaly bowel sounds are active to hyperactive.  Rectal;exam not done, Extremities ;no clubbing cyanosis or edema skin warm dry, Neuropsych; alert and oriented, grossly nonfocal mood and affect appropriate       Assessment & Plan:   #40 43 year old African-American male with history of longer-term intermittent diarrhea in setting of insulin-dependent diabetes mellitus, diabetic gastroparesis and probable component of diabetic enteropathy, now presenting with 2 to 3-week history of severe diarrhea with greater than 10 bowel movements yesterday.  He has been started back on an empiric course of Xifaxan, no improvement as yet  , Will need to rule out infectious etiology, rule out C. difficile with prior history of C. difficile. Doubt IBD but cannot rule out, rule out microscopic colitis Possible bacterial overgrowth in setting of insulin-dependent diabetes Rule out pancreatic insufficiency though diarrhea generally not so severe  #2 status post cholecystectomy-no response to trials of Questran in the past #3 prior history of C. Difficile  Plan; CBC with differential, be met, sed rate, CRP, TTG and IgA, GI pathogen panel, stool for lactoferrin and fecal elastase Start 2 Imodium p.o. every morning to be taken regularly Continue current course of Xifaxan 550 mg p.o. 3 times daily x14 days until other labs and stool studies have resulted Add Levsin 0.1251 p.o. 3 times daily before meals If infectious work-up is negative, I think he will need to be scheduled for colonoscopy with Dr. Carlean Purl with biopsies. Further recommendations pending results of above.  Duane [name] S [place] PA-C 02/13/2018   Cc: Charlott Rakes, MD   Agree with Ms. Genia Harold assessment and plan.  [name], MD, [facility]

## 2018-02-13 NOTE — Telephone Encounter (Signed)
Pt was seen 12/31.  Pt says he was expecting pscrp of Levsin to be sent to [facility].

## 2018-02-19 ENCOUNTER — Other Ambulatory Visit: Payer: Self-pay

## 2018-02-19 MED FILL — PANTOPRAZOLE SOD DR 40 MG T: 40 | 30 days supply | Qty: 30 | Fill #3

## 2018-02-19 MED FILL — GABAPENTIN 300 MG CAPSULE: 300 | 30 days supply | Qty: 30 | Fill #0

## 2018-02-19 MED FILL — CARTIA XT 180 MG CAPSULE SA: 180 | 30 days supply | Qty: 30 | Fill #2

## 2018-02-20 ENCOUNTER — Other Ambulatory Visit (INDEPENDENT_AMBULATORY_CARE_PROVIDER_SITE_OTHER): Payer: Medicare Other

## 2018-02-20 DIAGNOSIS — E1065 Type 1 diabetes mellitus with hyperglycemia: Secondary | ICD-10-CM | POA: Diagnosis not present

## 2018-02-20 LAB — BASIC METABOLIC PANEL
BUN: 21 mg/dL (ref 6–23)
CO2: 22 mEq/L (ref 19–32)
Calcium: 9.2 mg/dL (ref 8.4–10.5)
Chloride: 107 mEq/L (ref 96–112)
Creatinine, Ser: 1.73 mg/dL — ABNORMAL HIGH (ref 0.40–1.50)
GFR: 56.15 mL/min — ABNORMAL LOW (ref >60.00)
Glucose, Bld: 169 mg/dL — ABNORMAL HIGH (ref 70–99)
Potassium: 4.5 mEq/L (ref 3.5–5.1)
Sodium: 138 mEq/L (ref 135–145)

## 2018-02-21 LAB — GASTROINTESTINAL PATHOGEN PANEL PCR
C. difficile Tox A/B, PCR: NOT DETECTED
CAMPYLOBACTER, PCR: NOT DETECTED
Cryptosporidium, PCR: NOT DETECTED
E COLI (STEC) STX1/STX2, PCR: NOT DETECTED
E coli (ETEC) LT/ST PCR: NOT DETECTED
E coli 0157, PCR: NOT DETECTED
Giardia lamblia, PCR: NOT DETECTED
Norovirus, PCR: NOT DETECTED
Rotavirus A, PCR: NOT DETECTED
SALMONELLA, PCR: NOT DETECTED
Shigella, PCR: NOT DETECTED

## 2018-02-21 LAB — PANCREATIC ELASTASE, FECAL: Pancreatic Elastase-1, Stool: 36 mcg/g — ABNORMAL LOW

## 2018-02-21 LAB — FECAL LACTOFERRIN, QUANT
Fecal Lactoferrin: NEGATIVE
MICRO NUMBER:: 3203
SPECIMEN QUALITY:: ADEQUATE

## 2018-02-21 LAB — C-PEPTIDE: C-Peptide: 2.4 ng/mL (ref 1.1–4.4)

## 2018-02-21 LAB — FRUCTOSAMINE: Fructosamine: 249 umol/L (ref 0–285)

## 2018-02-24 ENCOUNTER — Ambulatory Visit (INDEPENDENT_AMBULATORY_CARE_PROVIDER_SITE_OTHER): Payer: Medicare Other | Admitting: Endocrinology

## 2018-02-24 ENCOUNTER — Encounter: Payer: Self-pay | Admitting: Endocrinology

## 2018-02-24 VITALS — BP 150/88 | HR 90 | Ht 76.0 in | Wt 362.6 lb

## 2018-02-24 DIAGNOSIS — E1065 Type 1 diabetes mellitus with hyperglycemia: Secondary | ICD-10-CM

## 2018-02-24 DIAGNOSIS — I10 Essential (primary) hypertension: Secondary | ICD-10-CM | POA: Diagnosis not present

## 2018-02-24 DIAGNOSIS — E1169 Type 2 diabetes mellitus with other specified complication: Secondary | ICD-10-CM | POA: Diagnosis not present

## 2018-02-24 DIAGNOSIS — Z6841 Body Mass Index (BMI) 40.0 and over, adult: Secondary | ICD-10-CM | POA: Diagnosis not present

## 2018-02-24 DIAGNOSIS — R197 Diarrhea, unspecified: Secondary | ICD-10-CM | POA: Diagnosis not present

## 2018-02-24 MED ORDER — CLONIDINE 0.1 MG/24HR TD PTWK: 0.1000 mg | MEDICATED_PATCH | TRANSDERMAL | 2 refills | Status: DC

## 2018-02-24 MED FILL — cloNIDine 0.1 MG/24HR PTWK: 0.1 | 28 days supply | Qty: 4 | Fill #0

## 2018-02-24 NOTE — Patient Instructions (Addendum)
Take  1/2 Lisinopril with Catapres  Check blood sugars on waking up 7 days a week  Also check blood sugars about 2 hours after meals and do this after different meals by rotation  Recommended blood sugar levels on waking up are 90-130 and about 2 hours after meal is 130-160  Please bring your blood sugar monitor to each visit, thank you

## 2018-02-24 NOTE — Progress Notes (Signed)
Patient ID: Duane [name], male   DOB: [birth year], 43 y.o.   MRN: [id number]           Reason for Appointment : Follow-up for Type 1 Diabetes  History of Present Illness          Diagnosis: Type 1 diabetes mellitus, date of diagnosis: 04/2003         Previous history:  He was initially diagnosed with symptoms of high blood sugars needing hospitalization with a glucose of about 1100 Initially sent home on insulin and he thinks he was able to get off insulin for about a year after this Subsequently went back on insulin  Previous records are not available as he was under medical care in [place] Not clear what level of control he has had in the past   Recent history:  His A1c is last 11.8 done in the hospital, previously higher Fructosamine is 249  INSULIN regimen is described as: Lantus 40--35 daily Humalog with meals 15-20  units before eating   CURRENT management, blood sugar patterns and problems identified:   Has only a few random blood sugars over the last month and does not appear motivated to check his blood sugars  His Lantus was increased in the morning on the last visit  Although he has reported low blood sugars late at night a couple of weeks ago he thinks this may have been when he was not eating as much in the evening  He is still not counting carbohydrates  He is again empirically taking Humalog without any consideration of portions of carbohydrates, previously was told to try 1: 3 carbohydrate coverage.  May increase the dose by 5 units if eating a large meal  However occasionally may forget to take Humalog before meals  Still waiting for consultation with dietitian  Unclear what his fasting blood sugars are and most of his blood sugars are about noontime  Blood sugar late afternoon was not significantly high in the lab     Glucose monitoring:  done 3 times a day         Glucometer: Contour next and true Metrix    Blood Glucose readings from review of  monitor as follows:     PRE-MEAL Fasting Lunch Dinner Bedtime Overall  Glucose range:   126-288  338  114   Mean/median:        POST-MEAL PC Breakfast PC Lunch PC Dinner  Glucose range:   ?  Mean/median:        Self-care: The diet that the patient has been following is: None  Meals: 2-3 meals per day. Eating and  8 pm; 2 am occasionally and at 8 am      Breakfast is sometimes skipped, otherwise eggs or breakfast sandwiches.      Exercise: 3 days a week , aerobic and weightlifting for up to one hour in the afternoon         Dietician consultation: Most recent: Years ago.          CDE consultation: None Retinal exam: Most recent: years ago    Diabetes labs:   Lab Results  Component Value Date   HGBA1C 11.8 (H) 12/22/2017   HGBA1C 12.7 (A) 11/20/2017   HGBA1C 13.0 (H) 08/02/2017   Lab Results  Component Value Date   MICROALBUR 11.3 (H) 09/13/2014   LDLCALC 65 05/17/2017   CREATININE 1.73 (H) 02/20/2018    Allergies as of 02/24/2018   No Known Allergies  Medication List       Accurate as of February 24, 2018  3:27 PM. Always use your most recent med list.        aluminum chloride 20 % external solution Commonly known as:  DRYSOL Apply topically at bedtime.   atorvastatin 20 MG tablet Commonly known as:  LIPITOR Take 1 tablet (20 mg total) by mouth daily.   cloNIDine 0.1 mg/24hr patch Commonly known as:  CATAPRES - Dosed in mg/24 hr Place 1 patch (0.1 mg total) onto the skin once a week.   diltiazem 180 MG 24 hr capsule Commonly known as:  CARTIA XT Take 1 capsule (180 mg total) by mouth daily.   furosemide 40 MG tablet Commonly known as:  LASIX Take 1 tablet (40 mg total) by mouth daily. Hold Tomorrow's Dose and resume 12/26/17   gabapentin 300 MG capsule Commonly known as:  NEURONTIN Take 1 capsule (300 mg total) by mouth at bedtime.   glucose blood test strip Commonly known as:  TRUE METRIX BLOOD GLUCOSE TEST 1 each by Other route 3 (three)  times daily.   glucose blood test strip Commonly known as:  CONTOUR NEXT TEST Use as instructed   HUMALOG KWIKPEN 100 UNIT/ML KwikPen Generic drug:  insulin lispro INJECT 15 UNITS INTO THE SKIN 3 TIMES DAILY.   hyoscyamine 0.125 MG tablet Commonly known as:  LEVSIN Take 1 tablet (0.125 mg total) by mouth 3 (three) times daily with meals as needed.   Insulin Glargine 100 UNIT/ML Solostar Pen Commonly known as:  LANTUS SOLOSTAR Inject 35 Units into the skin 2 (two) times daily.   Insulin Pen Needle 31G X 8 MM Misc Inject 1 each into the skin 4 (four) times daily. Check blood sugar TID and QHS   lisinopril 40 MG tablet Commonly known as:  PRINIVIL,ZESTRIL Take 1 tablet (40 mg total) by mouth daily.   metoCLOPramide 10 MG tablet Commonly known as:  REGLAN TAKE 1 TABLET BY MOUTH 3 TIMES DAILY BEFORE MEALS AS NEEDED   pantoprazole 40 MG tablet Commonly known as:  PROTONIX Take 1 tablet (40 mg total) by mouth daily.   Peppermint Oil 90 MG Cpcr Commonly known as:  IBGARD Take 2 capsules by mouth 3 (three) times daily as needed.   potassium chloride 10 MEQ CR capsule Commonly known as:  MICRO-K Take 1 capsule (10 mEq total) by mouth daily.   promethazine 25 MG tablet Commonly known as:  PHENERGAN TAKE 1 TABLET BY MOUTH EVERY 8 HOURS AS NEEDED FOR NAUSEA OR VOMITING.   rifaximin 550 MG Tabs tablet Commonly known as:  XIFAXAN Take 1 tablet (550 mg total) by mouth 3 (three) times daily.   sucralfate 1 g tablet Commonly known as:  CARAFATE TAKE 1 TABLET BY MOUTH 4 TIMES DAILY WITH MEALS AND AT BEDTIME.   Tadalafil 2.5 MG Tabs Take 1 tablet (2.5 mg total) by mouth daily.   TRUE METRIX METER w/Device Kit 1 each by Does not apply route 4 (four) times daily -  before meals and at bedtime.   TRUEPLUS LANCETS 28G Misc 1 each by Does not apply route 4 (four) times daily -  before meals and at bedtime.       Allergies: No Known Allergies  Past Medical History:    Diagnosis Date  . Depression   . Diabetes mellitus without complication [facility]) age 63   insulin requiring from the start  . Diabetic neuropathy ([place]) Dx 20110  . Diabetic proliferative retinopathy ([place])  08/02/2017  . Gastritis   . Gastroparesis 2012   100% gastric retention on 08/2013 [facility]  . GERD (gastroesophageal reflux disease) Dx 2012  . Hypertension Dx 2012  . Obesity     Past Surgical History:  Procedure Laterality Date  . CHOLECYSTECTOMY  2013  . ESOPHAGOGASTRODUODENOSCOPY N/A 06/11/2013   Procedure: ESOPHAGOGASTRODUODENOSCOPY (EGD);  Surgeon: [name], MD;  Location: [facility];  Service: Endoscopy;  Laterality: N/A;  . EYE SURGERY    . FLEXIBLE SIGMOIDOSCOPY N/A 06/11/2013   Procedure: FLEXIBLE SIGMOIDOSCOPY;  Surgeon: [name], MD;  Location: [facility];  Service: Endoscopy;  Laterality: N/A;  . KNEE SURGERY Left     Family History  Problem Relation Age of Onset  . Diabetes Father   . Diabetes Maternal Grandmother   . Diabetes Paternal Grandmother   . Colon cancer Neg Hx   . Stomach cancer Neg Hx     Social History:  reports that he has never smoked. He has never used smokeless tobacco. He reports that he does not drink alcohol or use drugs.     Review of Systems  Gastrointestinal: Positive for diarrhea.  Recent studies negative for infectious disease podiatry and not on any specific treatment, this has been a problem for several years  DIABETES complications: Gastroparesis and recurrent diarrhea, severe peripheral neuropathy with sensory loss Retinopathy, proliferative Erectile dysfunction Diabetic nephropathy with proteinuria and mild renal insufficiency      HYPERLIPIDEMIA:  Treated with atorvastatin  Lab Results  Component Value Date   CHOL 138 05/17/2017   HDL 29 (L) 05/17/2017   LDLCALC 65 05/17/2017   TRIG 221 (H) 05/17/2017   CHOLHDL 4.8 05/17/2017   HYPERTENSION currently being treated with diltiazem, and lisinopril 40 mg  BP  Readings from Last 3 Encounters:  02/24/18 (!) 150/88  02/11/18 134/84  01/08/18 112/72     CRF: Followed by nephrologist   Lab Results  Component Value Date   CREATININE 1.73 (H) 02/20/2018   CREATININE 1.85 (H) 01/01/2018   CREATININE 1.71 (H) 12/24/2017    NEUROPATHY: Currently taking 300 mg gabapentin for pain control  LABS:  Lab on 02/20/2018  Component Date Value Ref Range Status  . C-Peptide 02/20/2018 2.4  1.1 - 4.4 ng/mL Final   C-Peptide reference interval is for fasting patients.  . Sodium 02/20/2018 138  135 - 145 mEq/L Final  . Potassium 02/20/2018 4.5  3.5 - 5.1 mEq/L Final  . Chloride 02/20/2018 107  96 - 112 mEq/L Final  . CO2 02/20/2018 22  19 - 32 mEq/L Final  . Glucose, Bld 02/20/2018 169* 70 - 99 mg/dL Final  . BUN 02/20/2018 21  6 - 23 mg/dL Final  . Creatinine, Ser 02/20/2018 1.73* 0.40 - 1.50 mg/dL Final  . Calcium 02/20/2018 9.2  8.4 - 10.5 mg/dL Final  . GFR 02/20/2018 56.15* >60.00 mL/min Final  . Fructosamine 02/20/2018 249  0 - 285 umol/L Final   Comment: Published reference interval for apparently healthy subjects between age 68 and 54 is 49 - 285 umol/L and in a poorly controlled diabetic population is 228 - 563 umol/L with a mean of 396 umol/L.     Physical Examination:  BP (!) 150/88 (BP Location: Left Arm, Patient Position: Sitting, Cuff Size: Large)   Pulse 90   Ht _0  (1.93 m)   Wt (!) 362 lb 9.6 oz (164.5 kg)   SpO2 93%   BMI 44.14 kg/m   Repeat blood pressure 140/94 No ankle  edema     ASSESSMENT:  Diabetes type 1, long-standing with obesity  He has had  persistent poor control associated with multiple complications   W4R is 15.4 However his fructosamine is 249 which is indicating better control  Difficult to know what his blood sugar patterns are since he is checking blood sugars very erratically and not at the specified times Not able to adjust his insulin for this reason Also not clear if he is having any high  postprandial readings, has had a couple of high readings possibly from going off his diet Because of his tendency to hypoglycemia late at night he probably does not reduce his NovoLog enough when he is eating small meals or less carbohydrate His C-peptide is not low even with his renal dysfunction and may not qualify for any insulin pump on Medicare except Omnipod  HYPERTENSION: Recently blood pressure appears to be higher  PLAN:    He will check sugars at least 3 times a day with his Contour meter  Given him a worksheet to complete with information on what he eats, blood sugar before and after the meal and insulin taking for review with a nutritionist  Need to try 1: 3 carb coverage if he is able to look up the carbohydrates for a given meal  No change in Lantus insulin  If he is able to check 4 times a day he can qualify for CGM  Trial of CLONIDINE patch for diarrhea with reducing the lisinopril to half tablet  Follow-up with PCP for blood pressure  If he is having consistently low blood sugars he will need to call  Make sure he takes his NovoLog with him when he is going out to eat and take it before eating   Patient Instructions  Take  1/2 Lisinopril with Catapres  Check blood sugars on waking up 7 days a week  Also check blood sugars about 2 hours after meals and do this after different meals by rotation  Recommended blood sugar levels on waking up are 90-130 and about 2 hours after meal is 130-160  Please bring your blood sugar monitor to each visit, thank you      Counseling time on subjects discussed in assessment and plan sections is over 50% of today's 25 minute visit    Elayne Snare 02/24/2018, 3:27 PM   Note: This note was prepared with Dragon voice recognition system technology. Any transcriptional errors that result from this process are unintentional.

## 2018-02-27 ENCOUNTER — Other Ambulatory Visit: Payer: Self-pay | Admitting: Family Medicine

## 2018-02-27 DIAGNOSIS — E1043 Type 1 diabetes mellitus with diabetic autonomic (poly)neuropathy: Secondary | ICD-10-CM

## 2018-02-27 DIAGNOSIS — K3184 Gastroparesis: Principal | ICD-10-CM

## 2018-02-27 MED FILL — ATORVASTATIN 20 MG TABLET: 20 | 30 days supply | Qty: 30 | Fill #3

## 2018-02-27 MED FILL — LISINOPRIL 40 MG TABLET: 40 | 30 days supply | Qty: 30 | Fill #1

## 2018-02-28 ENCOUNTER — Other Ambulatory Visit: Payer: Self-pay

## 2018-02-28 MED ORDER — PANCRELIPASE (LIP-PROT-AMYL) 40000-126000 UNITS PO CPEP: 2.0000 | ORAL_CAPSULE | Freq: Three times a day (TID) | ORAL | 11 refills | Status: DC

## 2018-02-28 MED FILL — !ZENPEP 20000-63000 UNIT CA: 20000-63000 | 30 days supply | Qty: 360 | Fill #0

## 2018-03-04 MED FILL — METOCLOPRAMIDE 10 MG TABLET: 10 | 20 days supply | Qty: 60 | Fill #0

## 2018-03-05 ENCOUNTER — Telehealth: Payer: Self-pay | Admitting: Physician Assistant

## 2018-03-05 NOTE — Telephone Encounter (Signed)
Patient agrees to the colonoscopy but he is concerned about the pain he is having now. He says he "about went to the ER." he cannot get comfortable. "Afraid to eat cause it is going to make me hurt." Colonoscopy can be scheduled 03/17/2018. Suggestions?

## 2018-03-05 NOTE — Telephone Encounter (Signed)
Started Zen pep a few days ago. He calls today with complaints of low abdominal pain. Continues to have loose bowel movements which he says have improved from watery diarrhea. His low abdominal pain comes in waves. He has temporary relief after a bowel movement. He has nausea which he describes as being different from his gastroparesis nausea. Afebrile. No bloody bowel movements. Please advise.

## 2018-03-05 NOTE — Telephone Encounter (Signed)
Pt last seen Duane [name] 12.31.19.  Pt reports he is having severe stomach pain.  Please advise.

## 2018-03-05 NOTE — Telephone Encounter (Signed)
Ask him to continue to take the levsin 3 times daily before meals, and continue Zenpep  As doing Next step is colonoscopy  With bx's with [name] - if pt would like to proceed with that can go ahead and schedule

## 2018-03-06 ENCOUNTER — Other Ambulatory Visit: Payer: Self-pay

## 2018-03-06 MED ORDER — TRAMADOL HCL 50 MG PO TABS: 50.0000 mg | ORAL_TABLET | Freq: Four times a day (QID) | ORAL | 0 refills | Status: DC | PRN

## 2018-03-06 NOTE — Telephone Encounter (Signed)
Discussed with the patient. EGD/colon 03/25/2018 Pre-visit 03/11/2018 BMI 44.16 Ultram Rx faxed to [facility].

## 2018-03-06 NOTE — Telephone Encounter (Signed)
He has gastroparesis , and chronic pain I think from diabetic visceral neuropathy. I done want him on chronic pain meds... stay on bentyl.. can let him try Ultram 50 mg q 6-8 hours only if severe pain #30/0 ....  See if Carlean Purl can Do EGD same day as Colon

## 2018-03-07 ENCOUNTER — Other Ambulatory Visit: Payer: Self-pay

## 2018-03-07 DIAGNOSIS — K529 Noninfective gastroenteritis and colitis, unspecified: Secondary | ICD-10-CM

## 2018-03-07 DIAGNOSIS — R1013 Epigastric pain: Secondary | ICD-10-CM

## 2018-03-10 ENCOUNTER — Ambulatory Visit: Payer: Self-pay | Admitting: Dietician

## 2018-03-11 ENCOUNTER — Ambulatory Visit (AMBULATORY_SURGERY_CENTER): Payer: Self-pay

## 2018-03-11 VITALS — Ht 76.0 in | Wt 363.0 lb

## 2018-03-11 DIAGNOSIS — R197 Diarrhea, unspecified: Secondary | ICD-10-CM

## 2018-03-11 MED ORDER — NA SULFATE-K SULFATE-MG SULF 17.5-3.13-1.6 GM/177ML PO SOLN: 1.0000 | Freq: Once | ORAL | 0 refills | Status: AC

## 2018-03-11 NOTE — Progress Notes (Signed)
Denies allergies to eggs or soy products. Denies complication of anesthesia or sedation. Denies use of weight loss medication. Denies use of O2.   Emmi instructions declined.   A sample of Suprep was given to the patient.  Patient verbalizes instructions for procedure.

## 2018-03-13 MED FILL — FUROSEMIDE 40 MG TAB: 40 | 30 days supply | Qty: 30 | Fill #1

## 2018-03-13 MED FILL — HYOSCYAMINE SULF 0.125 MG T: 0.125 | 30 days supply | Qty: 90 | Fill #1

## 2018-03-14 ENCOUNTER — Other Ambulatory Visit: Payer: Self-pay | Admitting: Family Medicine

## 2018-03-14 DIAGNOSIS — K3184 Gastroparesis: Principal | ICD-10-CM

## 2018-03-14 DIAGNOSIS — E1043 Type 1 diabetes mellitus with diabetic autonomic (poly)neuropathy: Secondary | ICD-10-CM

## 2018-03-14 MED FILL — traMADol HCL 50 MG TABS: 50 | 7 days supply | Qty: 30 | Fill #0

## 2018-03-17 ENCOUNTER — Telehealth: Payer: Self-pay | Admitting: Internal Medicine

## 2018-03-17 NOTE — Telephone Encounter (Signed)
FYI I cancelled the procedure as he has requested.  He technically can be done in the [place] now if I am understanding the new rule. Only the BMI has to be under 50. Is this correct?  Patient was to be EGD/colon.

## 2018-03-17 NOTE — Telephone Encounter (Signed)
I am not aware of rule changes re: weight more than 350 but have been away  Still needs a care partner

## 2018-03-18 MED FILL — SUCRALFATE 1 GM TABLET: 1 | 30 days supply | Qty: 120 | Fill #0

## 2018-03-18 NOTE — Telephone Encounter (Signed)
Spoke with the patient. He is sick right now. URI Agrees to call me back in a few days. He does want to reschedule. Wants to try for an afternoon appointment because he is more likely to be able to find a care partner.

## 2018-03-21 ENCOUNTER — Encounter: Payer: Self-pay | Admitting: Primary Care

## 2018-03-21 ENCOUNTER — Ambulatory Visit (HOSPITAL_BASED_OUTPATIENT_CLINIC_OR_DEPARTMENT_OTHER): Payer: Medicare Other | Admitting: Primary Care

## 2018-03-21 ENCOUNTER — Ambulatory Visit (HOSPITAL_COMMUNITY)
Admission: RE | Admit: 2018-03-21 | Discharge: 2018-03-21 | Disposition: A | Discharge status: Home / Self Care | Payer: Medicare Other | Source: Ambulatory Visit | Attending: Primary Care | Admitting: Primary Care

## 2018-03-21 VITALS — BP 164/101 | HR 112 | Temp 101.5°F | Resp 20 | Ht 76.0 in | Wt 358.0 lb

## 2018-03-21 DIAGNOSIS — R509 Fever, unspecified: Secondary | ICD-10-CM

## 2018-03-21 DIAGNOSIS — Z6841 Body Mass Index (BMI) 40.0 and over, adult: Secondary | ICD-10-CM

## 2018-03-21 DIAGNOSIS — R05 Cough: Secondary | ICD-10-CM | POA: Insufficient documentation

## 2018-03-21 DIAGNOSIS — J069 Acute upper respiratory infection, unspecified: Secondary | ICD-10-CM | POA: Insufficient documentation

## 2018-03-21 DIAGNOSIS — E785 Hyperlipidemia, unspecified: Secondary | ICD-10-CM | POA: Insufficient documentation

## 2018-03-21 DIAGNOSIS — E1143 Type 2 diabetes mellitus with diabetic autonomic (poly)neuropathy: Secondary | ICD-10-CM | POA: Insufficient documentation

## 2018-03-21 DIAGNOSIS — F329 Major depressive disorder, single episode, unspecified: Secondary | ICD-10-CM

## 2018-03-21 DIAGNOSIS — E1122 Type 2 diabetes mellitus with diabetic chronic kidney disease: Secondary | ICD-10-CM | POA: Insufficient documentation

## 2018-03-21 DIAGNOSIS — R0981 Nasal congestion: Secondary | ICD-10-CM | POA: Insufficient documentation

## 2018-03-21 DIAGNOSIS — G709 Myoneural disorder, unspecified: Secondary | ICD-10-CM | POA: Insufficient documentation

## 2018-03-21 DIAGNOSIS — E669 Obesity, unspecified: Secondary | ICD-10-CM | POA: Insufficient documentation

## 2018-03-21 DIAGNOSIS — K219 Gastro-esophageal reflux disease without esophagitis: Secondary | ICD-10-CM

## 2018-03-21 DIAGNOSIS — Z794 Long term (current) use of insulin: Secondary | ICD-10-CM

## 2018-03-21 DIAGNOSIS — M1990 Unspecified osteoarthritis, unspecified site: Secondary | ICD-10-CM

## 2018-03-21 DIAGNOSIS — I129 Hypertensive chronic kidney disease with stage 1 through stage 4 chronic kidney disease, or unspecified chronic kidney disease: Secondary | ICD-10-CM | POA: Insufficient documentation

## 2018-03-21 DIAGNOSIS — J988 Other specified respiratory disorders: Secondary | ICD-10-CM | POA: Diagnosis not present

## 2018-03-21 DIAGNOSIS — K3184 Gastroparesis: Secondary | ICD-10-CM | POA: Insufficient documentation

## 2018-03-21 DIAGNOSIS — Z79899 Other long term (current) drug therapy: Secondary | ICD-10-CM | POA: Insufficient documentation

## 2018-03-21 DIAGNOSIS — Z800 Family history of malignant neoplasm of digestive organs: Secondary | ICD-10-CM

## 2018-03-21 DIAGNOSIS — N189 Chronic kidney disease, unspecified: Secondary | ICD-10-CM

## 2018-03-21 DIAGNOSIS — E113599 Type 2 diabetes mellitus with proliferative diabetic retinopathy without macular edema, unspecified eye: Secondary | ICD-10-CM | POA: Insufficient documentation

## 2018-03-21 DIAGNOSIS — E113592 Type 2 diabetes mellitus with proliferative diabetic retinopathy without macular edema, left eye: Secondary | ICD-10-CM

## 2018-03-21 DIAGNOSIS — L97511 Non-pressure chronic ulcer of other part of right foot limited to breakdown of skin: Secondary | ICD-10-CM | POA: Diagnosis not present

## 2018-03-21 DIAGNOSIS — F321 Major depressive disorder, single episode, moderate: Secondary | ICD-10-CM

## 2018-03-21 DIAGNOSIS — Z833 Family history of diabetes mellitus: Secondary | ICD-10-CM | POA: Insufficient documentation

## 2018-03-21 DIAGNOSIS — Z9049 Acquired absence of other specified parts of digestive tract: Secondary | ICD-10-CM | POA: Insufficient documentation

## 2018-03-21 LAB — GLUCOSE, POCT (MANUAL RESULT ENTRY): POC Glucose: 191 mg/dl — AB (ref 70–99)

## 2018-03-21 LAB — POCT INFLUENZA A/B
INFLUENZA B, POC: NEGATIVE
Influenza A, POC: NEGATIVE

## 2018-03-21 MED ORDER — LEVOFLOXACIN 500 MG PO TABS: 500.0000 mg | ORAL_TABLET | Freq: Every day | ORAL | 0 refills | Status: DC

## 2018-03-21 MED ORDER — GLUCOSE BLOOD VI STRP: ORAL_STRIP | 12 refills | Status: AC

## 2018-03-21 MED FILL — CARTIA XT 180 MG CAPSULE SA: 180 | 30 days supply | Qty: 30 | Fill #3

## 2018-03-21 MED FILL — levoFLOXacin 500 MG TABS: 500 | 7 days supply | Qty: 7 | Fill #0

## 2018-03-21 NOTE — Patient Instructions (Signed)
  Place upper respiratory infection patient instructions here.

## 2018-03-21 NOTE — Progress Notes (Signed)
Acute Office Visit  Subjective:    Patient ID: Duane [name], male    DOB: 01-Mar-1975, 43 y.o.   MRN: [id number]  Chief Complaint  Patient presents with  . URI    HPI Patient is in today for cough , congestion and fever today 103.5 he is unable to eat no appetite but has been try to drink. Appears to be dehydrated with crack dry lips and tongue without moisture. Patient has a hx of pneumonia will send for a chest x-ray and start antibiotics empirically.    Past Medical History:  Diagnosis Date  . Allergy   . Arthritis   . Chronic kidney disease   . Depression   . Diabetes mellitus without complication [facility]) age 83   insulin requiring from the start  . Diabetic neuropathy ([place]) Dx 20110  . Diabetic proliferative retinopathy ([place]) 08/02/2017  . Gastritis   . Gastroparesis 2012   100% gastric retention on 08/2013 GES  . GERD (gastroesophageal reflux disease) Dx 2012  . Hyperlipidemia   . Hypertension Dx 2012  . Neuromuscular disorder ([place])   . Obesity     Past Surgical History:  Procedure Laterality Date  . CHOLECYSTECTOMY  2013  . ESOPHAGOGASTRODUODENOSCOPY N/A 06/11/2013   Procedure: ESOPHAGOGASTRODUODENOSCOPY (EGD);  Surgeon: [name], MD;  Location: [facility];  Service: Endoscopy;  Laterality: N/A;  . EYE SURGERY    . FLEXIBLE SIGMOIDOSCOPY N/A 06/11/2013   Procedure: FLEXIBLE SIGMOIDOSCOPY;  Surgeon: [name], MD;  Location: [facility];  Service: Endoscopy;  Laterality: N/A;  . KNEE SURGERY Left     Family History  Problem Relation Age of Onset  . Diabetes Father   . Diabetes Maternal Grandmother   . Diabetes Paternal Grandmother   . Colon cancer Cousin   . Stomach cancer Neg Hx   . Rectal cancer Neg Hx     Social History   Socioeconomic History  . Marital status: Single    Spouse name: Not on file  . Number of children: Not on file  . Years of education: Not on file  . Highest education level: Not on file  Occupational History  .  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  difficult to work due to n/v.   Social Needs  . Financial resource strain: Not on file  . Food insecurity:    Worry: Not on file    Inability: Not on file  . Transportation needs:    Medical: Not on file    Non-medical: Not on file  Tobacco Use  . Smoking status: Never Smoker  . Smokeless tobacco: Never Used  Substance and Sexual Activity  . Alcohol use: No  . Drug use: No  . Sexual activity: Not on file  Lifestyle  . Physical activity:    Days per week: Not on file    Minutes per session: Not on file  . Stress: Not on file  Relationships  . Social connections:    Talks on phone: Not on file    Gets together: Not on file    Attends religious service: Not on file    Active member of club or organization: Not on file    Attends meetings of clubs or organizations: Not on file    Relationship status: Not on file  . Intimate partner violence:    Fear of current or ex partner: Not on file    Emotionally abused: Not on file    Physically abused: Not on  file    Forced sexual activity: Not on file  Other Topics Concern  . Not on file  Social History Narrative   He is single and a disabled security guard   1 son at least, I do not know if he has other children   No alcohol tobacco or drug use reported no smoking    Outpatient Medications Prior to Visit  Medication Sig Dispense Refill  . atorvastatin (LIPITOR) 20 MG tablet Take 1 tablet (20 mg total) by mouth daily. 30 tablet 6  . diltiazem (CARTIA XT) 180 MG 24 hr capsule Take 1 capsule (180 mg total) by mouth daily. 30 capsule 6  . furosemide (LASIX) 40 MG tablet Take 1 tablet (40 mg total) by mouth daily. Hold Tomorrow's Dose and resume 12/26/17 (Patient taking differently: Take 40 mg by mouth daily. ) 30 tablet 6  . gabapentin (NEURONTIN) 300 MG capsule Take 1 capsule (300 mg total) by mouth at bedtime. 30 capsule 6  . HUMALOG KWIKPEN 100 UNIT/ML KwikPen INJECT 15 UNITS  INTO THE SKIN 3 TIMES DAILY. (Patient taking differently: Inject 15-20 Units into the skin 3 (three) times daily. Per sliding scale) 15 mL 2  . hyoscyamine (LEVSIN) 0.125 MG tablet Take 1 tablet (0.125 mg total) by mouth 3 (three) times daily with meals as needed. (Patient taking differently: Take 0.125 mg by mouth 3 (three) times daily with meals as needed for bladder spasms or cramping. ) 90 tablet 4  . Insulin Glargine (LANTUS SOLOSTAR) 100 UNIT/ML Solostar Pen Inject 35 Units into the skin 2 (two) times daily. 90 mL 3  . lisinopril (PRINIVIL,ZESTRIL) 40 MG tablet Take 1 tablet (40 mg total) by mouth daily. 30 tablet 3  . metoCLOPramide (REGLAN) 10 MG tablet Take 1 tablet (10 mg total) by mouth every 8 (eight) hours as needed for nausea. TAKE 1 TABLET BY MOUTH 3 TIMES DAILY BEFORE MEALS AS NEEDED (Patient taking differently: Take 10 mg by mouth 3 (three) times daily. ) 60 tablet 3  . Pancrelipase, Lip-Prot-Amyl, (ZENPEP) 40000-126000 units CPEP Take 2 capsules by mouth 3 (three) times daily with meals. Take during meal 180 capsule 11  . pantoprazole (PROTONIX) 40 MG tablet Take 1 tablet (40 mg total) by mouth daily. 30 tablet 5  . Peppermint Oil (IBGARD) 90 MG CPCR Take 2 capsules by mouth 3 (three) times daily as needed. (Patient taking differently: Take 1 capsule by mouth 3 (three) times daily. ) 16 capsule 0  . potassium chloride (MICRO-K) 10 MEQ CR capsule Take 1 capsule (10 mEq total) by mouth daily. 30 capsule 0  . promethazine (PHENERGAN) 25 MG tablet TAKE 1 TABLET BY MOUTH EVERY 8 HOURS AS NEEDED FOR NAUSEA OR VOMITING. (Patient taking differently: Take 25 mg by mouth every 6 (six) hours as needed for nausea or vomiting. ) 60 tablet 0  . sucralfate (CARAFATE) 1 g tablet Take 1 tablet (1 g total) by mouth 4 (four) times daily. 120 tablet 1  . traMADol (ULTRAM) 50 MG tablet Take 1 tablet (50 mg total) by mouth every 6 (six) hours as needed. (Patient taking differently: Take 50 mg by mouth every 6  (six) hours as needed for moderate pain. ) 30 tablet 0  . Blood Glucose Monitoring Suppl (TRUE METRIX METER) W/DEVICE KIT 1 each by Does not apply route 4 (four) times daily -  before meals and at bedtime. (Patient not taking: Reported on 03/12/2018) 1 kit ea  . cloNIDine (CATAPRES - DOSED IN MG/24 HR)  0.1 mg/24hr patch Place 1 patch (0.1 mg total) onto the skin once a week. (Patient not taking: Reported on 03/21/2018) 4 patch 2  . glucose blood (CONTOUR NEXT TEST) test strip Use as instructed (Patient not taking: Reported on 03/12/2018) 100 each 12  . glucose blood (TRUE METRIX BLOOD GLUCOSE TEST) test strip 1 each by Other route 3 (three) times daily. (Patient not taking: Reported on 03/12/2018) 100 each 11  . Insulin Pen Needle 31G X 8 MM MISC Inject 1 each into the skin 4 (four) times daily. Check blood sugar TID and QHS (Patient not taking: Reported on 03/12/2018) 120 each 11  . TRUEPLUS LANCETS 28G MISC 1 each by Does not apply route 4 (four) times daily -  before meals and at bedtime. (Patient not taking: Reported on 03/12/2018) 100 each 12   No facility-administered medications prior to visit.     No Known Allergies  Review of Systems  Constitutional: Positive for fever and malaise/fatigue.  HENT: Negative.   Eyes: Negative.   Respiratory: Positive for cough and sputum production.   Cardiovascular: Negative.   Gastrointestinal: Negative.   Genitourinary: Negative.   Musculoskeletal: Negative.   Skin: Negative.   Neurological: Positive for headaches.  Endo/Heme/Allergies: Negative.   Psychiatric/Behavioral: Positive for depression.       Objective:    Physical Exam  Constitutional: He is oriented to person, place, and time. He appears well-nourished.  HENT:  Head: Normocephalic.  Eyes: EOM are normal.  Neck: Neck supple.  Cardiovascular: Normal rate and regular rhythm.  Pulmonary/Chest: He is in respiratory distress. He has wheezes.  Abdominal: Soft. Bowel sounds are normal. He  exhibits distension.  Musculoskeletal: Normal range of motion.  Neurological: He is oriented to person, place, and time.  Skin: Skin is warm and dry.  Psychiatric: He has a normal mood and affect.    There were no vitals taken for this visit. Wt Readings from Last 3 Encounters:  03/11/18 (!) 363 lb (164.7 kg)  02/24/18 (!) 362 lb 9.6 oz (164.5 kg)  02/11/18 (!) 361 lb 6 oz (163.9 kg)    Health Maintenance Due  Topic Date Due  . PNEUMOCOCCAL POLYSACCHARIDE VACCINE AGE 2-64 HIGH RISK  09/25/1978  . TETANUS/TDAP  09/25/1995  . OPHTHALMOLOGY EXAM  10/02/2017      Lab Results  Component Value Date   TSH 2.670 05/17/2017   Lab Results  Component Value Date   WBC 7.3 12/24/2017   HGB 10.8 (L) 12/24/2017   HCT 34.7 (L) 12/24/2017   MCV 85.5 12/24/2017   PLT 318 12/24/2017   Lab Results  Component Value Date   NA 138 02/20/2018   K 4.5 02/20/2018   CO2 22 02/20/2018   GLUCOSE 169 (H) 02/20/2018   BUN 21 02/20/2018   CREATININE 1.73 (H) 02/20/2018   BILITOT <0.2 01/01/2018   ALKPHOS 109 01/01/2018   AST 16 01/01/2018   ALT 26 01/01/2018   PROT 6.4 01/01/2018   ALBUMIN 3.4 (L) 01/01/2018   CALCIUM 9.2 02/20/2018   ANIONGAP 9 12/24/2017   GFR 56.15 (L) 02/20/2018   Lab Results  Component Value Date   CHOL 138 05/17/2017   Lab Results  Component Value Date   HDL 29 (L) 05/17/2017   Lab Results  Component Value Date   LDLCALC 65 05/17/2017   Lab Results  Component Value Date   TRIG 221 (H) 05/17/2017   Lab Results  Component Value Date   CHOLHDL 4.8 05/17/2017   Lab  Results  Component Value Date   HGBA1C 11.8 (H) 12/22/2017       Assessment & Plan:   Problem List Items Addressed This Visit    Diabetic proliferative retinopathy ([place])  Refer to ophthalmologic recc is yearly     Depression PHQR 15 will ask CSW on flu schedule to see CSW    Upper respiratory infection with cough and congestion   Relevant Orders   Influenza A/B   Respiratory  infection - Primary   Relevant Orders   Influenza A/B neg for flu will obtain chest x-ray r/o pneumonia v/s bronchitis          Follow up with PCP 2 month     Kerin Perna, NP

## 2018-03-21 NOTE — Progress Notes (Signed)
Cough, clear phlegm and drainage SOB Taking OTC medications for sx's H/o PNA  Flu shot in hospital Pain 5/10- back and neck  Contour next test strips

## 2018-03-24 MED FILL — GABAPENTIN 300 MG CAPSULE: 300 | 30 days supply | Qty: 30 | Fill #1

## 2018-03-24 MED FILL — METOCLOPRAMIDE 10 MG TABLET: 10 | 20 days supply | Qty: 60 | Fill #1

## 2018-03-24 MED FILL — PANTOPRAZOLE SOD DR 40 MG T: 40 | 30 days supply | Qty: 30 | Fill #4

## 2018-03-25 ENCOUNTER — Encounter (HOSPITAL_COMMUNITY): Payer: Self-pay

## 2018-03-25 ENCOUNTER — Ambulatory Visit (HOSPITAL_COMMUNITY): Admit: 2018-03-25 | Payer: Medicare Other | Admitting: Internal Medicine

## 2018-03-25 ENCOUNTER — Encounter: Payer: Self-pay | Admitting: Internal Medicine

## 2018-03-25 SURGERY — ESOPHAGOGASTRODUODENOSCOPY (EGD) WITH PROPOFOL
Anesthesia: Monitor Anesthesia Care

## 2018-03-28 MED FILL — ATORVASTATIN 20 MG TABLET: 20 | 30 days supply | Qty: 30 | Fill #4

## 2018-03-28 MED FILL — $HUMALOG 100 UNITS/ML KWIKP: 100 | 66 days supply | Qty: 30 | Fill #1

## 2018-04-01 ENCOUNTER — Other Ambulatory Visit (INDEPENDENT_AMBULATORY_CARE_PROVIDER_SITE_OTHER): Payer: Medicare Other

## 2018-04-01 DIAGNOSIS — E1065 Type 1 diabetes mellitus with hyperglycemia: Secondary | ICD-10-CM | POA: Diagnosis not present

## 2018-04-01 LAB — COMPREHENSIVE METABOLIC PANEL
ALBUMIN: 3.1 g/dL — AB (ref 3.5–5.2)
ALT: 20 U/L (ref 0–53)
AST: 18 U/L (ref 0–37)
Alkaline Phosphatase: 89 U/L (ref 39–117)
BUN: 16 mg/dL (ref 6–23)
CALCIUM: 8.6 mg/dL (ref 8.4–10.5)
CO2: 25 mEq/L (ref 19–32)
Chloride: 108 mEq/L (ref 96–112)
Creatinine, Ser: 1.49 mg/dL (ref 0.40–1.50)
GFR: 62.73 mL/min (ref >60.00)
Glucose, Bld: 120 mg/dL — ABNORMAL HIGH (ref 70–99)
Potassium: 3.8 mEq/L (ref 3.5–5.1)
Sodium: 139 mEq/L (ref 135–145)
Total Bilirubin: 0.3 mg/dL (ref 0.2–1.2)
Total Protein: 6.4 g/dL (ref 6.0–8.3)

## 2018-04-01 LAB — HEMOGLOBIN A1C: Hgb A1c MFr Bld: 9.5 % — ABNORMAL HIGH (ref 4.6–6.5)

## 2018-04-03 MED FILL — LISINOPRIL 40 MG TABLET: 40 | 30 days supply | Qty: 30 | Fill #2

## 2018-04-07 ENCOUNTER — Encounter: Payer: Self-pay | Admitting: Family Medicine

## 2018-04-07 ENCOUNTER — Ambulatory Visit: Payer: Medicare Other | Attending: Family Medicine | Admitting: Family Medicine

## 2018-04-07 VITALS — BP 155/98 | HR 88 | Temp 98.2°F | Ht 76.0 in | Wt 353.6 lb

## 2018-04-07 DIAGNOSIS — K3184 Gastroparesis: Secondary | ICD-10-CM | POA: Insufficient documentation

## 2018-04-07 DIAGNOSIS — E1122 Type 2 diabetes mellitus with diabetic chronic kidney disease: Secondary | ICD-10-CM | POA: Diagnosis not present

## 2018-04-07 DIAGNOSIS — I129 Hypertensive chronic kidney disease with stage 1 through stage 4 chronic kidney disease, or unspecified chronic kidney disease: Secondary | ICD-10-CM | POA: Diagnosis not present

## 2018-04-07 DIAGNOSIS — K219 Gastro-esophageal reflux disease without esophagitis: Secondary | ICD-10-CM | POA: Diagnosis not present

## 2018-04-07 DIAGNOSIS — R600 Localized edema: Secondary | ICD-10-CM | POA: Diagnosis not present

## 2018-04-07 DIAGNOSIS — I10 Essential (primary) hypertension: Secondary | ICD-10-CM

## 2018-04-07 DIAGNOSIS — E669 Obesity, unspecified: Secondary | ICD-10-CM | POA: Diagnosis not present

## 2018-04-07 DIAGNOSIS — E1042 Type 1 diabetes mellitus with diabetic polyneuropathy: Secondary | ICD-10-CM | POA: Diagnosis not present

## 2018-04-07 DIAGNOSIS — Z6841 Body Mass Index (BMI) 40.0 and over, adult: Secondary | ICD-10-CM | POA: Diagnosis not present

## 2018-04-07 DIAGNOSIS — Z79899 Other long term (current) drug therapy: Secondary | ICD-10-CM | POA: Diagnosis not present

## 2018-04-07 DIAGNOSIS — R109 Unspecified abdominal pain: Secondary | ICD-10-CM

## 2018-04-07 DIAGNOSIS — Z794 Long term (current) use of insulin: Secondary | ICD-10-CM | POA: Diagnosis not present

## 2018-04-07 DIAGNOSIS — K529 Noninfective gastroenteritis and colitis, unspecified: Secondary | ICD-10-CM | POA: Diagnosis not present

## 2018-04-07 DIAGNOSIS — E785 Hyperlipidemia, unspecified: Secondary | ICD-10-CM | POA: Insufficient documentation

## 2018-04-07 DIAGNOSIS — N183 Chronic kidney disease, stage 3 unspecified: Secondary | ICD-10-CM

## 2018-04-07 DIAGNOSIS — Z833 Family history of diabetes mellitus: Secondary | ICD-10-CM | POA: Insufficient documentation

## 2018-04-07 DIAGNOSIS — E1022 Type 1 diabetes mellitus with diabetic chronic kidney disease: Secondary | ICD-10-CM | POA: Insufficient documentation

## 2018-04-07 LAB — GLUCOSE, POCT (MANUAL RESULT ENTRY): POC GLUCOSE: 132 mg/dL — AB (ref 70–99)

## 2018-04-07 MED ORDER — POTASSIUM CHLORIDE ER 10 MEQ PO CPCR: 10.0000 meq | ORAL_CAPSULE | Freq: Every day | ORAL | 3 refills | Status: DC

## 2018-04-07 MED FILL — POTASSIUM CHLORIDE ER 10 ME: 10 | 30 days supply | Qty: 30 | Fill #0

## 2018-04-07 NOTE — Progress Notes (Signed)
Patient is still having abdominal pain and swelling in legs.

## 2018-04-07 NOTE — Progress Notes (Signed)
Subjective:  Patient ID: Duane [name], male    DOB: February 18, 1975  Age: 43 y.o. MRN: [id number]  CC: Diabetes   HPI Duane [name] s a 43 year old male with history of type 1 diabetes mellitus (A1c9.5), diabetic neuropathy, diabetic gastroparesis hypertension, stage III chronic kidney disease, depression who presents today for a follow-up visit. He is concerned about his bilateral pedal edema for which he is currently on Lasix.  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  Denies dyspnea, paroxysmal nocturnal dyspnea, weight gain. For his chronic kidney disease he sees nephrology ([facility] in 2 days. He also has intermittent abdominal pain which is diffuse and continues to have chronic diarrhea but states that his stools are somewhat more formed than previous.  He was scheduled for an EGD and colonoscopy by GI but had to reschedule due to an acute illness last month.  He had an office visit with his endocrinologist, Dr. Dwyane Dee last month for management of his diabetes mellitus.  At that visit he was prescribed clonidine patch which was thought would help with his diarrhea however he is yet to commence this and his blood pressure is elevated today but he endorses compliance with his antihypertensive.  Past Medical History:  Diagnosis Date  . Allergy   . Arthritis   . Chronic kidney disease   . Depression   . Diabetes mellitus without complication [facility]) age 43   insulin requiring from the start  . Diabetic neuropathy ([place]) Dx 20110  . Diabetic proliferative retinopathy ([place]) 08/02/2017  . Gastritis   . Gastroparesis 2012   100% gastric retention on 08/2013 GES  . GERD (gastroesophageal reflux disease) Dx 2012  . Hyperlipidemia   . Hypertension Dx 2012  . Neuromuscular disorder ([place])   . Obesity     Past Surgical History:  Procedure Laterality Date  . CHOLECYSTECTOMY  2013  . ESOPHAGOGASTRODUODENOSCOPY N/A 06/11/2013   Procedure: ESOPHAGOGASTRODUODENOSCOPY  (EGD);  Surgeon: [name], MD;  Location: [facility];  Service: Endoscopy;  Laterality: N/A;  . EYE SURGERY    . FLEXIBLE SIGMOIDOSCOPY N/A 06/11/2013   Procedure: FLEXIBLE SIGMOIDOSCOPY;  Surgeon: [name], MD;  [facility];  Service: Endoscopy;  Laterality: N/A;  . KNEE SURGERY Left     Family History  Problem Relation Age of Onset  . Diabetes Father   . Diabetes Maternal Grandmother   . Diabetes Paternal Grandmother   . Colon cancer Cousin   . Stomach cancer Neg Hx   . Rectal cancer Neg Hx     No Known Allergies  Outpatient Medications Prior to Visit  Medication Sig Dispense Refill  . atorvastatin (LIPITOR) 20 MG tablet Take 1 tablet (20 mg total) by mouth daily. 30 tablet 6  . Blood Glucose Monitoring Suppl (TRUE METRIX METER) W/DEVICE KIT 1 each by Does not apply route 4 (four) times daily -  before meals and at bedtime. 1 kit ea  . cloNIDine (CATAPRES - DOSED IN MG/24 HR) 0.1 mg/24hr patch Place 1 patch (0.1 mg total) onto the skin once a week. 4 patch 2  . diltiazem (CARTIA XT) 180 MG 24 hr capsule Take 1 capsule (180 mg total) by mouth daily. 30 capsule 6  . furosemide (LASIX) 40 MG tablet Take 1 tablet (40 mg total) by mouth daily. Hold Tomorrow's Dose and resume 12/26/17 (Patient taking differently: Take 40 mg by mouth daily. ) 30 tablet 6  . gabapentin (NEURONTIN) 300 MG capsule Take 1 capsule (  300 mg total) by mouth at bedtime. 30 capsule 6  . glucose blood (TRUE METRIX BLOOD GLUCOSE TEST) test strip 1 each by Other route 3 (three) times daily. 100 each 11  . HUMALOG KWIKPEN 100 UNIT/ML KwikPen INJECT 15 UNITS INTO THE SKIN 3 TIMES DAILY. (Patient taking differently: Inject 15-20 Units into the skin 3 (three) times daily. Per sliding scale) 15 mL 2  . hyoscyamine (LEVSIN) 0.125 MG tablet Take 1 tablet (0.125 mg total) by mouth 3 (three) times daily with meals as needed. (Patient taking differently: Take 0.125 mg by mouth 3 (three) times daily with  meals as needed for bladder spasms or cramping. ) 90 tablet 4  . Insulin Glargine (LANTUS SOLOSTAR) 100 UNIT/ML Solostar Pen Inject 35 Units into the skin 2 (two) times daily. 90 mL 3  . Insulin Pen Needle 31G X 8 MM MISC Inject 1 each into the skin 4 (four) times daily. Check blood sugar TID and QHS 120 each 11  . lisinopril (PRINIVIL,ZESTRIL) 40 MG tablet Take 1 tablet (40 mg total) by mouth daily. 30 tablet 3  . metoCLOPramide (REGLAN) 10 MG tablet Take 1 tablet (10 mg total) by mouth every 8 (eight) hours as needed for nausea. TAKE 1 TABLET BY MOUTH 3 TIMES DAILY BEFORE MEALS AS NEEDED (Patient taking differently: Take 10 mg by mouth 3 (three) times daily. ) 60 tablet 3  . Pancrelipase, Lip-Prot-Amyl, (ZENPEP) 40000-126000 units CPEP Take 2 capsules by mouth 3 (three) times daily with meals. Take during meal 180 capsule 11  . pantoprazole (PROTONIX) 40 MG tablet Take 1 tablet (40 mg total) by mouth daily. 30 tablet 5  . Peppermint Oil (IBGARD) 90 MG CPCR Take 2 capsules by mouth 3 (three) times daily as needed. (Patient taking differently: Take 1 capsule by mouth 3 (three) times daily. ) 16 capsule 0  . promethazine (PHENERGAN) 25 MG tablet TAKE 1 TABLET BY MOUTH EVERY 8 HOURS AS NEEDED FOR NAUSEA OR VOMITING. (Patient taking differently: Take 25 mg by mouth every 6 (six) hours as needed for nausea or vomiting. ) 60 tablet 0  . sucralfate (CARAFATE) 1 g tablet Take 1 tablet (1 g total) by mouth 4 (four) times daily. 120 tablet 1  . traMADol (ULTRAM) 50 MG tablet Take 1 tablet (50 mg total) by mouth every 6 (six) hours as needed. (Patient taking differently: Take 50 mg by mouth every 6 (six) hours as needed for moderate pain. ) 30 tablet 0  . TRUEPLUS LANCETS 28G MISC 1 each by Does not apply route 4 (four) times daily -  before meals and at bedtime. 100 each 12  . potassium chloride (MICRO-K) 10 MEQ CR capsule Take 1 capsule (10 mEq total) by mouth daily. 30 capsule 0  . glucose blood (CONTOUR NEXT  TEST) test strip Use as instructed (Patient not taking: Reported on 03/12/2018) 100 each 12  . glucose blood (CONTOUR NEXT TEST) test strip Use as instructed (Patient not taking: Reported on 04/07/2018) 100 each 12  . levofloxacin (LEVAQUIN) 500 MG tablet Take 1 tablet (500 mg total) by mouth daily. (Patient not taking: Reported on 04/07/2018) 7 tablet 0   No facility-administered medications prior to visit.      ROS Review of Systems General: negative for fever, weight loss, appetite change Eyes: no visual symptoms. ENT: no ear symptoms, no sinus tenderness, no nasal congestion or sore throat. Neck: no pain  Respiratory: no wheezing, shortness of breath, cough Cardiovascular: no chest pain, no dyspnea  on exertion, +pedal edema, no orthopnea. Gastrointestinal: see hpi Genito-Urinary: no urinary frequency, no dysuria, no polyuria. Hematologic: no bruising Endocrine: no cold or heat intolerance Neurological: no headaches, no seizures, no tremors Musculoskeletal: no joint pains, no joint swelling Skin: no pruritus, no rash. Psychological: no depression, no anxiety,    Objective:  BP (!) 155/98   Pulse 88   Temp 98.2 F (36.8 C) (Oral)   Ht _0  (1.93 m)   Wt (!) 353 lb 9.6 oz (160.4 kg)   SpO2 99%   BMI 43.04 kg/m   BP/Weight 04/07/2018 03/21/2018 9/37/9024  Systolic BP 097 353 -  Diastolic BP 98 299 -  Wt. (Lbs) 353.6 358 363  BMI 43.04 43.58 44.19      Physical Exam Constitutional: normal appearing,  Eyes: PERRLA HEENT: Head is atraumatic, normal sinuses, normal oropharynx, normal appearing tonsils and palate, tympanic membrane is normal bilaterally. Neck: normal range of motion, no thyromegaly, no JVD Cardiovascular: normal rate and rhythm, normal heart sounds, no murmurs, rub or gallop, 2+ b/l pedal edema Respiratory: Normal breath sounds, clear to auscultation bilaterally, no wheezes, no rales, no rhonchi Abdomen: soft, not tender to palpation, normal bowel sounds,  no enlarged organs Musculoskeletal: FROM Skin: warm and dry, no lesions. Neurological: alert, oriented x3, cranial nerves I-XII grossly intact , normal motor strength, normal sensation. Psychological: normal mood.   CMP Latest Ref Rng & Units 04/01/2018 02/20/2018 01/01/2018  Glucose 70 - 99 mg/dL 120(H) 169(H) 177(H)  BUN 6 - 23 mg/dL _1 Creatinine 0.40 - 1.50 mg/dL 1.49 1.73(H) 1.85(H)  Sodium 135 - 145 mEq/L 139 138 141  Potassium 3.5 - 5.1 mEq/L 3.8 4.5 4.3  Chloride 96 - 112 mEq/L 108 107 103  CO2 19 - 32 mEq/L _2 Calcium 8.4 - 10.5 mg/dL 8.6 9.2 9.0  Total Protein 6.0 - 8.3 g/dL 6.4 - 6.4  Total Bilirubin 0.2 - 1.2 mg/dL 0.3 - <0.2  Alkaline Phos 39 - 117 U/L 89 - 109  AST 0 - 37 U/L 18 - 16  ALT 0 - 53 U/L 20 - 26    Lipid Panel     Component Value Date/Time   CHOL 138 05/17/2017 1026   TRIG 221 (H) 05/17/2017 1026   HDL 29 (L) 05/17/2017 1026   CHOLHDL 4.8 05/17/2017 1026   CHOLHDL 4.4 07/07/2014 1036   VLDL 21 07/07/2014 1036   LDLCALC 65 05/17/2017 1026    CBC    Component Value Date/Time   WBC 7.3 12/24/2017 0752   RBC 4.06 (L) 12/24/2017 0752   HGB 10.8 (L) 12/24/2017 0752   HCT 34.7 (L) 12/24/2017 0752   PLT 318 12/24/2017 0752   MCV 85.5 12/24/2017 0752   MCH 26.6 12/24/2017 0752   MCHC 31.1 12/24/2017 0752   RDW 12.6 12/24/2017 0752   LYMPHSABS 2.3 12/24/2017 0752   MONOABS 0.6 12/24/2017 0752   EOSABS 0.3 12/24/2017 0752   BASOSABS 0.1 12/24/2017 0752    Lab Results  Component Value Date   HGBA1C 9.5 (H) 04/01/2018    Assessment & Plan:   1. Type 1 diabetes mellitus with diabetic polyneuropathy ([facility]) Uncontrolled with A1c of 9.5 Followed by endocrinology - POCT glucose (manual entry)  2. Pedal edema Likely due to chronic renal disease Currently on Lasix might need uptitrating of dose Will defer to nephrology regarding this Use compression stocking - potassium chloride (MICRO-K) 10 MEQ CR capsule; Take 1 capsule (10 mEq  total)  by mouth daily.  Dispense: 30 capsule; Refill: 3  3. Chronic diarrhea Improved on Zenpep Followed by GI and will benefit from colonoscopy Advised to call to make appointment for his colonoscopy  4. CKD stage 3 secondary to diabetes ([place]) Combination of hypertensive and diabetic nephropathy Scheduled to see nephrology in 2 days  5. Essential hypertension Uncontrolled He is yet to commence clonidine patch which was prescribed by endocrine  6. Abdominal pain, unspecified abdominal location Likely related to his chronic diarrhea versus IBS Needs colonoscopy   Meds ordered this encounter  Medications  . potassium chloride (MICRO-K) 10 MEQ CR capsule    Sig: Take 1 capsule (10 mEq total) by mouth daily.    Dispense:  30 capsule    Refill:  3    Follow-up: Return in about 3 months (around 07/06/2018) for Follow-up of chronic medical conditions.       Charlott Rakes, MD, FAAFP. [facility] and [place], [place]   04/07/2018, 5:03 PM

## 2018-04-08 ENCOUNTER — Ambulatory Visit: Payer: Self-pay | Admitting: Endocrinology

## 2018-04-09 DIAGNOSIS — I129 Hypertensive chronic kidney disease with stage 1 through stage 4 chronic kidney disease, or unspecified chronic kidney disease: Secondary | ICD-10-CM | POA: Diagnosis not present

## 2018-04-09 DIAGNOSIS — N183 Chronic kidney disease, stage 3 (moderate): Secondary | ICD-10-CM | POA: Diagnosis not present

## 2018-04-09 DIAGNOSIS — E669 Obesity, unspecified: Secondary | ICD-10-CM | POA: Diagnosis not present

## 2018-04-09 DIAGNOSIS — R609 Edema, unspecified: Secondary | ICD-10-CM | POA: Diagnosis not present

## 2018-04-10 NOTE — Progress Notes (Signed)
Patient ID: Duane [name], male   DOB: 1975-08-26, 43 y.o.   MRN: [id number]           Reason for Appointment : Follow-up for Type 1 Diabetes  History of Present Illness          Diagnosis: Type 1 diabetes mellitus, date of diagnosis: 04/2003         Previous history:  He was initially diagnosed with symptoms of high blood sugars needing hospitalization with a glucose of about 1100 Initially sent home on insulin and he thinks he was able to get off insulin for about a year after this Subsequently went back on insulin  Previous records are not available as he was under medical care in [place] Not clear what level of control he has had in the past   Recent history:  His A1c is 9.5 compared to 11.8 previously He was seen after his episode of ketoacidosis in 11/19  Fructosamine is 249 last  INSULIN regimen is described as: Lantus 35-40 daily Humalog with meals 10-15  units before eating   CURRENT management, blood sugar patterns and problems identified:   He now says that his insurance does not cover Contour test strips and is still using true Metrix  Despite specific instructions he is checking blood sugars only randomly and mostly at bedtime  After much discussion appears that he is eating a few hours before his blood sugar check in the evening and blood sugar may be higher from snacks in the evening which he does not cover with insulin  Still not counting carbohydrates or estimating correctly how much to take for his meals  Has had minimal diabetes education and still has not made appointment with the dietitian  Difficult to judge which readings in the early afternoon are fasting and these are not consistently high  However he thinks that occasionally he may get a low sugar if he is overestimating his mealtime insulin  His weight is still significantly high  Generally not very active     Glucose monitoring:  done 3 times a day         Glucometer: Contour next and true  Metrix    Blood Glucose readings from review of monitor as follows:    PRE-MEAL Fasting Lunch Dinner Bedtime Overall  Glucose range:  141-219   274  180-272   Mean/median:        POST-MEAL PC Breakfast PC Lunch PC Dinner  Glucose range:   134, 145  137, 205  Mean/median:      Previous readings:    PRE-MEAL Fasting Lunch Dinner Bedtime Overall  Glucose range:   126-288  338  114   Mean/median:        POST-MEAL PC Breakfast PC Lunch PC Dinner  Glucose range:   ?  Mean/median:        Self-care: The diet that the patient has been following is: None  Meals: 2-3 meals per day. Eating at  8 pm; 2 am occasionally and at 8 am      Breakfast is sometimes skipped, otherwise eggs or breakfast sandwiches.      Exercise: 3 days a week , aerobic and weightlifting for up to one hour in the afternoon         Dietician consultation: Most recent: Years ago.          CDE consultation: None Retinal exam: Most recent: years ago    Diabetes labs:   Lab Results  Component Value Date  HGBA1C 9.5 (H) 04/01/2018   HGBA1C 11.8 (H) 12/22/2017   HGBA1C 12.7 (A) 11/20/2017   Lab Results  Component Value Date   MICROALBUR 11.3 (H) 09/13/2014   LDLCALC 65 05/17/2017   CREATININE 1.49 04/01/2018    Allergies as of 04/11/2018   No Known Allergies     Medication List       Accurate as of April 11, 2018 11:59 PM. Always use your most recent med list.        atorvastatin 20 MG tablet Commonly known as:  LIPITOR Take 1 tablet (20 mg total) by mouth daily.   cloNIDine 0.1 mg/24hr patch Commonly known as:  CATAPRES - Dosed in mg/24 hr Place 1 patch (0.1 mg total) onto the skin once a week.   diltiazem 180 MG 24 hr capsule Commonly known as:  CARTIA XT Take 1 capsule (180 mg total) by mouth daily.   furosemide 40 MG tablet Commonly known as:  LASIX Take 1 tablet (40 mg total) by mouth daily. Hold Tomorrow's Dose and resume 12/26/17   gabapentin 300 MG capsule Commonly known  as:  NEURONTIN Take 1 capsule (300 mg total) by mouth at bedtime.   glucose blood test strip Commonly known as:  TRUE METRIX BLOOD GLUCOSE TEST 1 each by Other route 3 (three) times daily.   glucose blood test strip Commonly known as:  CONTOUR NEXT TEST Use as instructed   glucose blood test strip Commonly known as:  CONTOUR NEXT TEST Use as instructed   HUMALOG KWIKPEN 100 UNIT/ML KwikPen Generic drug:  insulin lispro INJECT 15 UNITS INTO THE SKIN 3 TIMES DAILY.   hyoscyamine 0.125 MG tablet Commonly known as:  LEVSIN Take 1 tablet (0.125 mg total) by mouth 3 (three) times daily with meals as needed.   Insulin Glargine 100 UNIT/ML Solostar Pen Commonly known as:  LANTUS SOLOSTAR Inject 35 Units into the skin 2 (two) times daily.   Insulin Pen Needle 31G X 8 MM Misc Inject 1 each into the skin 4 (four) times daily. Check blood sugar TID and QHS   lisinopril 40 MG tablet Commonly known as:  PRINIVIL,ZESTRIL Take 1 tablet (40 mg total) by mouth daily.   metoCLOPramide 10 MG tablet Commonly known as:  REGLAN Take 1 tablet (10 mg total) by mouth every 8 (eight) hours as needed for nausea. TAKE 1 TABLET BY MOUTH 3 TIMES DAILY BEFORE MEALS AS NEEDED   Pancrelipase (Lip-Prot-Amyl) 40000-126000 units Cpep Commonly known as:  ZENPEP Take 2 capsules by mouth 3 (three) times daily with meals. Take during meal   pantoprazole 40 MG tablet Commonly known as:  PROTONIX Take 1 tablet (40 mg total) by mouth daily.   Peppermint Oil 90 MG Cpcr Commonly known as:  IBGARD Take 2 capsules by mouth 3 (three) times daily as needed.   potassium chloride 10 MEQ CR capsule Commonly known as:  MICRO-K Take 1 capsule (10 mEq total) by mouth daily.   promethazine 25 MG tablet Commonly known as:  PHENERGAN TAKE 1 TABLET BY MOUTH EVERY 8 HOURS AS NEEDED FOR NAUSEA OR VOMITING.   sucralfate 1 g tablet Commonly known as:  CARAFATE Take 1 tablet (1 g total) by mouth 4 (four) times daily.     traMADol 50 MG tablet Commonly known as:  ULTRAM Take 1 tablet (50 mg total) by mouth every 6 (six) hours as needed.   TRUE METRIX METER w/Device Kit 1 each by Does not apply route 4 (four) times daily -  before meals and at bedtime.   TRUEPLUS LANCETS 28G Misc 1 each by Does not apply route 4 (four) times daily -  before meals and at bedtime.       Allergies: No Known Allergies  Past Medical History:  Diagnosis Date  . Allergy   . Arthritis   . Chronic kidney disease   . Depression   . Diabetes mellitus without complication [facility]) age 57   insulin requiring from the start  . Diabetic neuropathy ([place]) Dx [address]  . Diabetic proliferative retinopathy ([place]) 08/02/2017  . Gastritis   . Gastroparesis 2012   100% gastric retention on 08/2013 [facility]  . GERD (gastroesophageal reflux disease) Dx 2012  . Hyperlipidemia   . Hypertension Dx 2012  . Neuromuscular disorder ([place])   . Obesity     Past Surgical History:  Procedure Laterality Date  . CHOLECYSTECTOMY  2013  . ESOPHAGOGASTRODUODENOSCOPY N/A 06/11/2013   Procedure: ESOPHAGOGASTRODUODENOSCOPY (EGD);  Surgeon: [name], MD;  Location: [facility];  Service: Endoscopy;  Laterality: N/A;  . EYE SURGERY    . FLEXIBLE SIGMOIDOSCOPY N/A 06/11/2013   Procedure: FLEXIBLE SIGMOIDOSCOPY;  Surgeon: [name], MD;  Location: [facility];  Service: Endoscopy;  Laterality: N/A;  . KNEE SURGERY Left     Family History  Problem Relation Age of Onset  . Diabetes Father   . Diabetes Maternal Grandmother   . Diabetes Paternal Grandmother   . Colon cancer Cousin   . Stomach cancer Neg Hx   . Rectal cancer Neg Hx     Social History:  reports that he has never smoked. He has never used smokeless tobacco. He reports that he does not drink alcohol or use drugs.     Review of Systems     DIABETES complications: Gastroparesis and recurrent diarrhea, severe peripheral neuropathy with sensory loss Retinopathy,  proliferative Erectile dysfunction Diabetic nephropathy with proteinuria and mild renal insufficiency  For his significant diarrhea which has been due to unknown reasons he was advised to try empirically a regimen of clomiphene patch while adjusting his lisinopril but his nephrologist refused to allow him to do this Diarrhea may be slightly better      HYPERLIPIDEMIA:  Treated with atorvastatin  Lab Results  Component Value Date   CHOL 138 05/17/2017   HDL 29 (L) 05/17/2017   LDLCALC 65 05/17/2017   TRIG 221 (H) 05/17/2017   CHOLHDL 4.8 05/17/2017   HYPERTENSION currently being treated with diltiazem, and lisinopril 40 mg Followed by nephrologist  BP Readings from Last 3 Encounters:  04/11/18 110/70  04/07/18 (!) 155/98  03/21/18 (!) 164/101     CRF: Followed by nephrologist   Lab Results  Component Value Date   CREATININE 1.49 04/01/2018   CREATININE 1.73 (H) 02/20/2018   CREATININE 1.85 (H) 01/01/2018    NEUROPATHY: Currently taking 300 mg gabapentin for pain control  LABS:  Office Visit on 04/07/2018  Component Date Value Ref Range Status  . POC Glucose 04/07/2018 132* 70 - 99 mg/dl Final    Physical Examination:  BP 110/70 (BP Location: Left Arm, Patient Position: Sitting, Cuff Size: Large)   Pulse 100   Ht _0  (1.93 m)   Wt (!) 357 lb 12.8 oz (162.3 kg)   SpO2 96%   BMI 43.55 kg/m       ASSESSMENT:  Diabetes type 1, long-standing with obesity  He has had  persistent poor control associated with multiple complications   Y7X is 9.5, previously 11.8  As before he is not doing enough glucose monitoring Appears to have variable coverage of his meals with insulin and not clear which readings at home are postprandial Most of his readings at bedtime are significantly high Not clear from detailed discussion whether he is not getting enough insulin for his supper but most likely is getting inadequate basal insulin during the day and not enough insulin  to cover his evening snacks  Difficult to know what his blood sugar patterns are since he is checking blood sugars very erratically and not at the specified times Not able to adjust his insulin for this reason Does need more diabetes education especially carbohydrate counting and review of how he is covering his meals with Humalog   HYPERTENSION: Recently blood pressure appears to be better  Diarrhea: Again recommended that if his upcoming GI evaluation is negative he can try clonidine   PLAN:    He will find out if his Medicare B will cover his test strips  Needs much more glucose monitoring and discussed when to check it as well as targets  We will schedule appointment with dietitian and referral made, reminded him to complete the worksheet that was previously given  For now increase Lantus by 5 units in the mornings and take total of 45  To make sure he recovers his evening snacks with Humalog based on 1: 3 carbohydrate ratio which is what he will need for his meals  Also needs to limit snacks  Will benefit from regular physical activity also   Patient Instructions   Test strips on Medicare B.  Lantus 45 in am daily and 40 in pm daily  Humalog: divide total Carbs by 3 plus SS for hi sugars  Cover snacks too    Counseling time on subjects discussed in assessment and plan sections is over 50% of today's 25 minute visit     Elayne Snare 04/13/2018, 11:43 AM   Note: This note was prepared with Dragon voice recognition system technology. Any transcriptional errors that result from this process are unintentional.

## 2018-04-11 ENCOUNTER — Encounter: Payer: Self-pay | Admitting: Endocrinology

## 2018-04-11 ENCOUNTER — Ambulatory Visit (INDEPENDENT_AMBULATORY_CARE_PROVIDER_SITE_OTHER): Payer: Medicare Other | Admitting: Endocrinology

## 2018-04-11 VITALS — BP 110/70 | HR 100 | Ht 76.0 in | Wt 357.8 lb

## 2018-04-11 DIAGNOSIS — K529 Noninfective gastroenteritis and colitis, unspecified: Secondary | ICD-10-CM | POA: Diagnosis not present

## 2018-04-11 DIAGNOSIS — E1042 Type 1 diabetes mellitus with diabetic polyneuropathy: Secondary | ICD-10-CM

## 2018-04-11 DIAGNOSIS — I10 Essential (primary) hypertension: Secondary | ICD-10-CM

## 2018-04-11 NOTE — Patient Instructions (Addendum)
  Test strips on Medicare B.  Lantus 45 in am daily and 40 in pm daily  Humalog: divide total Carbs by 3 plus SS for hi sugars  Cover snacks too

## 2018-04-14 MED FILL — HYOSCYAMINE SULF 0.125 MG T: 0.125 | 30 days supply | Qty: 90 | Fill #2

## 2018-04-14 MED FILL — METOCLOPRAMIDE 10 MG TABLET: 10 | 20 days supply | Qty: 60 | Fill #2

## 2018-04-16 MED FILL — $LANTUS SOLOSTAR 100 UNITS/: 100 | 85 days supply | Qty: 60 | Fill #5

## 2018-04-18 ENCOUNTER — Telehealth: Payer: Self-pay | Admitting: Licensed Clinical Social Worker

## 2018-04-18 NOTE — Telephone Encounter (Signed)
Call placed to patient to follow up on behavioral health referral. Pt shared that he is interested in initiating psychotherapy to assist in the management of depression. Pt agreed to referral to [facility]. LCSWA discussed additional supportive resources provided at clinic. Pt agreed to receiving information on food mobile market and Legal Aid via mail. No additional concerns noted.

## 2018-04-22 MED FILL — CARTIA XT 180 MG CAPSULE SA: 180 | 30 days supply | Qty: 30 | Fill #4

## 2018-04-22 MED FILL — PANTOPRAZOLE SOD DR 40 MG T: 40 | 30 days supply | Qty: 30 | Fill #5

## 2018-04-28 MED FILL — GABAPENTIN 300 MG CAPSULE: 300 | 30 days supply | Qty: 30 | Fill #2

## 2018-04-29 MED FILL — LISINOPRIL 40 MG TABLET: 40 | 30 days supply | Qty: 30 | Fill #3

## 2018-04-29 MED FILL — ATORVASTATIN 20 MG TABLET: 20 | 30 days supply | Qty: 30 | Fill #5

## 2018-05-05 MED FILL — METOCLOPRAMIDE 10 MG TABLET: 10 | 20 days supply | Qty: 60 | Fill #3

## 2018-05-05 MED FILL — SUCRALFATE 1 GM TABLET: 1 | 30 days supply | Qty: 120 | Fill #1

## 2018-05-06 MED FILL — TRUE METRIX TEST STRIP: 25 days supply | Qty: 100 | Fill #0

## 2018-05-06 MED FILL — $ZENPEP 4000-12600 UNIT CAP: 40000-12600 | 100 days supply | Qty: 600 | Fill #0

## 2018-05-16 ENCOUNTER — Ambulatory Visit: Payer: Self-pay | Admitting: Dietician

## 2018-05-16 ENCOUNTER — Telehealth: Payer: Self-pay | Admitting: Dietician

## 2018-05-16 MED FILL — HYOSCYAMINE SULF 0.125 MG T: 0.125 | 30 days supply | Qty: 90 | Fill #3

## 2018-05-16 NOTE — Telephone Encounter (Signed)
Called patient due to missed appointment today and left a message for him to reschedule or call for any questions.  Antonieta Iba, RD, LDN, CDE

## 2018-05-21 ENCOUNTER — Other Ambulatory Visit: Payer: Self-pay | Admitting: Family Medicine

## 2018-05-21 DIAGNOSIS — E1043 Type 1 diabetes mellitus with diabetic autonomic (poly)neuropathy: Secondary | ICD-10-CM

## 2018-05-21 DIAGNOSIS — K3184 Gastroparesis: Principal | ICD-10-CM

## 2018-05-21 MED FILL — CARTIA XT 180 MG CAPSULE SA: 180 | 30 days supply | Qty: 30 | Fill #5

## 2018-05-22 MED FILL — PANTOPRAZOLE SOD DR 40 MG T: 40 | 30 days supply | Qty: 30 | Fill #0

## 2018-05-26 MED FILL — ATORVASTATIN 20 MG TABLET: 20 | 30 days supply | Qty: 30 | Fill #6

## 2018-05-26 MED FILL — GABAPENTIN 300 MG CAPSULE: 300 | 30 days supply | Qty: 30 | Fill #3

## 2018-05-28 ENCOUNTER — Other Ambulatory Visit: Payer: Self-pay | Admitting: Family Medicine

## 2018-05-28 DIAGNOSIS — K3184 Gastroparesis: Principal | ICD-10-CM

## 2018-05-28 DIAGNOSIS — E1043 Type 1 diabetes mellitus with diabetic autonomic (poly)neuropathy: Secondary | ICD-10-CM

## 2018-05-29 MED FILL — METOCLOPRAMIDE 10 MG TABLET: 10 | 20 days supply | Qty: 60 | Fill #0

## 2018-06-02 ENCOUNTER — Other Ambulatory Visit: Payer: Self-pay | Admitting: Family Medicine

## 2018-06-03 MED FILL — LISINOPRIL 40 MG TABLET: 40 | 90 days supply | Qty: 90 | Fill #0

## 2018-06-06 ENCOUNTER — Other Ambulatory Visit: Payer: Self-pay

## 2018-06-09 ENCOUNTER — Other Ambulatory Visit (INDEPENDENT_AMBULATORY_CARE_PROVIDER_SITE_OTHER): Payer: Medicare Other

## 2018-06-09 ENCOUNTER — Other Ambulatory Visit: Payer: Self-pay

## 2018-06-09 DIAGNOSIS — E1042 Type 1 diabetes mellitus with diabetic polyneuropathy: Secondary | ICD-10-CM | POA: Diagnosis not present

## 2018-06-09 LAB — LIPID PANEL
Cholesterol: 174 mg/dL (ref 0–200)
HDL: 34.6 mg/dL — ABNORMAL LOW (ref >39.00)
LDL Cholesterol: 114 mg/dL — ABNORMAL HIGH (ref 0–99)
NonHDL: 139.66
Total CHOL/HDL Ratio: 5
Triglycerides: 129 mg/dL (ref 0.0–149.0)
VLDL: 25.8 mg/dL (ref 0.0–40.0)

## 2018-06-09 LAB — BASIC METABOLIC PANEL
BUN: 15 mg/dL (ref 6–23)
CO2: 26 mEq/L (ref 19–32)
Calcium: 8.1 mg/dL — ABNORMAL LOW (ref 8.4–10.5)
Chloride: 107 mEq/L (ref 96–112)
Creatinine, Ser: 1.72 mg/dL — ABNORMAL HIGH (ref 0.40–1.50)
GFR: 53.1 mL/min — ABNORMAL LOW (ref >60.00)
Glucose, Bld: 158 mg/dL — ABNORMAL HIGH (ref 70–99)
Potassium: 3.8 mEq/L (ref 3.5–5.1)
Sodium: 139 mEq/L (ref 135–145)

## 2018-06-09 LAB — MICROALBUMIN / CREATININE URINE RATIO
Creatinine,U: 352.4 mg/dL
Microalb Creat Ratio: 119 mg/g — ABNORMAL HIGH (ref 0.0–30.0)
Microalb, Ur: 419.3 mg/dL — ABNORMAL HIGH (ref 0.0–1.9)

## 2018-06-10 LAB — FRUCTOSAMINE: Fructosamine: 193 umol/L (ref 0–285)

## 2018-06-11 ENCOUNTER — Ambulatory Visit (INDEPENDENT_AMBULATORY_CARE_PROVIDER_SITE_OTHER): Payer: Medicare Other | Admitting: Endocrinology

## 2018-06-11 ENCOUNTER — Encounter: Payer: Self-pay | Admitting: Endocrinology

## 2018-06-11 ENCOUNTER — Other Ambulatory Visit: Payer: Self-pay

## 2018-06-11 DIAGNOSIS — E782 Mixed hyperlipidemia: Secondary | ICD-10-CM | POA: Diagnosis not present

## 2018-06-11 DIAGNOSIS — E1065 Type 1 diabetes mellitus with hyperglycemia: Secondary | ICD-10-CM

## 2018-06-11 DIAGNOSIS — E1042 Type 1 diabetes mellitus with diabetic polyneuropathy: Secondary | ICD-10-CM | POA: Diagnosis not present

## 2018-06-11 NOTE — Progress Notes (Signed)
Patient ID: Duane [name], male   DOB: 02-21-1975, 43 y.o.   MRN: [id number]           Today's office visit was provided via telemedicine using video technique Explained to the patient and the the limitations of evaluation and management by telemedicine and the availability of in person appointments.  The patient understood the limitations and agreed to proceed. Patient also understood that the telehealth visit is billable. . Location of the patient: Home . Location of the provider: Office Only the patient and myself were participating in the encounter   Reason for Appointment : Follow-up for Type 1 Diabetes  History of Present Illness          Diagnosis: Type 1 diabetes mellitus, date of diagnosis: 04/2003         Previous history:  He was initially diagnosed with symptoms of high blood sugars needing hospitalization with a glucose of about 1100 Initially sent home on insulin and he thinks he was able to get off insulin for about a year after this Subsequently went back on insulin  Previous records are not available as he was under medical care in [place] Not clear what level of control he has had in the past  He was initially seen in consultation after his episode of ketoacidosis in 11/19  Recent history:  His A1c is 9.5 2/20 compared to 11.8 previously  Fructosamine is 193 vs 249 last  INSULIN regimen is described as: Lantus 40 a.m. -40 p.m. daily Humalog with meals 15-20 units  CURRENT management, blood sugar patterns and problems identified:   He is still using true Metrix and still has not started using the Contour meter  He is checking his blood sugars mostly around bedtime and only occasionally in the afternoons; however his waking up time occasionally can be in the early afternoon  Although he is taking somewhat more Humalog at mealtime his blood sugars about 4 hours after dinner are still high POSTPRANDIALLY  He appears to be adjusting his pre-meal insulin mostly  based on the blood sugar reading  HUMALOG is sometimes taken right after eating  He is afraid to adjust his insulin because of fear of hypoglycemia overnight  Previously had blood sugars around 80-90 waking him up around 7-8 AM and he cut back on his Lantus  However he did not increase his morning Lantus as directed to cover his high pre-supper readings  Also he was asked to increase his morning LANTUS to 45 units but he has not done so  Currently his fasting blood sugar cannot be assessed with only few readings, most recent 134  Also may not be checking blood sugars after his first meal and difficult to assess when he is checking his blood sugars     Glucose monitoring:  done generally 3 times a day         Glucometer: true Metrix    Blood Glucose readings from patient history for the last week showed the following   PRE-MEAL Fasting Lunch  early evening Bedtime Overall  Glucose range:  134   161-204  238-328   Mean/median:     ?   POST-MEAL PC Breakfast PC Lunch PC Dinner  Glucose range: ?  211, 228    Mean/median:      Previous readings:  PRE-MEAL Fasting Lunch Dinner Bedtime Overall  Glucose range:  141-219   274  180-272   Mean/median:        POST-MEAL PC Breakfast PC Lunch  PC Dinner  Glucose range:   134, 145  137, 205  Mean/median:      Previous readings:   Self-care: The diet that the patient has been following is: None  Meals: 2-3 meals per day    Breakfast is sometimes skipped, otherwise eggs or breakfast sandwiches.      Exercise:  Some walking        Dietician consultation: Most recent: Years ago.          CDE consultation: None   Diabetes labs:   Lab Results  Component Value Date   HGBA1C 9.5 (H) 04/01/2018   HGBA1C 11.8 (H) 12/22/2017   HGBA1C 12.7 (A) 11/20/2017   Lab Results  Component Value Date   MICROALBUR 419.3 (H) 06/09/2018   LDLCALC 114 (H) 06/09/2018   CREATININE 1.72 (H) 06/09/2018    Allergies as of 06/11/2018   No Known  Allergies     Medication List       Accurate as of June 11, 2018  3:18 PM. Always use your most recent med list.        atorvastatin 20 MG tablet Commonly known as:  LIPITOR Take 1 tablet (20 mg total) by mouth daily.   diltiazem 180 MG 24 hr capsule Commonly known as:  Cartia XT Take 1 capsule (180 mg total) by mouth daily.   furosemide 40 MG tablet Commonly known as:  LASIX Take 1 tablet (40 mg total) by mouth daily. Hold Tomorrow's Dose and resume 12/26/17   gabapentin 300 MG capsule Commonly known as:  NEURONTIN Take 1 capsule (300 mg total) by mouth at bedtime.   glucose blood test strip Commonly known as:  True Metrix Blood Glucose Test 1 each by Other route 3 (three) times daily.   glucose blood test strip Commonly known as:  Contour Next Test Use as instructed   glucose blood test strip Commonly known as:  Contour Next Test Use as instructed   HumaLOG KwikPen 100 UNIT/ML KwikPen Generic drug:  insulin lispro INJECT 15 UNITS INTO THE SKIN 3 TIMES DAILY.   hyoscyamine 0.125 MG tablet Commonly known as:  Levsin Take 1 tablet (0.125 mg total) by mouth 3 (three) times daily with meals as needed.   Insulin Glargine 100 UNIT/ML Solostar Pen Commonly known as:  Lantus SoloStar Inject 35 Units into the skin 2 (two) times daily.   Insulin Pen Needle 31G X 8 MM Misc Inject 1 each into the skin 4 (four) times daily. Check blood sugar TID and QHS   lisinopril 40 MG tablet Commonly known as:  ZESTRIL TAKE 1 TABLET (40 MG TOTAL) BY MOUTH DAILY.   metoCLOPramide 10 MG tablet Commonly known as:  REGLAN TAKE 1 TABLET 3 TIMES DAILY BEFORE MEALS AS NEEDED FOR NAUSEA   Pancrelipase (Lip-Prot-Amyl) 40000-126000 units Cpep Commonly known as:  Zenpep Take 2 capsules by mouth 3 (three) times daily with meals. Take during meal   pantoprazole 40 MG tablet Commonly known as:  PROTONIX TAKE 1 TABLET (40 MG TOTAL) BY MOUTH DAILY.   Peppermint Oil 90 MG Cpcr Commonly  known as:  IBgard Take 2 capsules by mouth 3 (three) times daily as needed.   potassium chloride 10 MEQ CR capsule Commonly known as:  MICRO-K Take 1 capsule (10 mEq total) by mouth daily.   promethazine 25 MG tablet Commonly known as:  PHENERGAN TAKE 1 TABLET BY MOUTH EVERY 8 HOURS AS NEEDED FOR NAUSEA OR VOMITING.   sucralfate 1 g tablet Commonly known  as:  CARAFATE Take 1 tablet (1 g total) by mouth 4 (four) times daily.   traMADol 50 MG tablet Commonly known as:  ULTRAM Take 1 tablet (50 mg total) by mouth every 6 (six) hours as needed.   True Metrix Meter w/Device Kit 1 each by Does not apply route 4 (four) times daily -  before meals and at bedtime.   TRUEplus Lancets 28G Misc 1 each by Does not apply route 4 (four) times daily -  before meals and at bedtime.       Allergies: No Known Allergies  Past Medical History:  Diagnosis Date  . Allergy   . Arthritis   . Chronic kidney disease   . Depression   . Diabetes mellitus without complication [facility]) age 34   insulin requiring from the start  . Diabetic neuropathy ([place]) Dx 20110  . Diabetic proliferative retinopathy ([place]) 08/02/2017  . Gastritis   . Gastroparesis 2012   100% gastric retention on 08/2013 GES  . GERD (gastroesophageal reflux disease) Dx 2012  . Hyperlipidemia   . Hypertension Dx 2012  . Neuromuscular disorder ([place])   . Obesity     Past Surgical History:  Procedure Laterality Date  . CHOLECYSTECTOMY  2013  . ESOPHAGOGASTRODUODENOSCOPY N/A 06/11/2013   Procedure: ESOPHAGOGASTRODUODENOSCOPY (EGD);  Surgeon: [name], MD;  Location: [facility];  Service: Endoscopy;  Laterality: N/A;  . EYE SURGERY    . FLEXIBLE SIGMOIDOSCOPY N/A 06/11/2013   Procedure: FLEXIBLE SIGMOIDOSCOPY;  Surgeon: [name], MD;  Location: [facility];  Service: Endoscopy;  Laterality: N/A;  . KNEE SURGERY Left     Family History  Problem Relation Age of Onset  . Diabetes Father   . Diabetes Maternal  Grandmother   . Diabetes Paternal Grandmother   . Colon cancer Cousin   . Stomach cancer Neg Hx   . Rectal cancer Neg Hx     Social History:  reports that he has never smoked. He has never used smokeless tobacco. He reports that he does not drink alcohol or use drugs.     Review of Systems   DIABETES complications: Gastroparesis and recurrent diarrhea, severe peripheral neuropathy with sensory loss Retinopathy, proliferative Erectile dysfunction Diabetic nephropathy with proteinuria and mild renal insufficiency  Diarrhea may be better recently      HYPERLIPIDEMIA:  Treated with atorvastatin and followed by PCP, currently taking atorvastatin regularly, 20 mg but LDL is still over 100  Lab Results  Component Value Date   CHOL 174 06/09/2018   HDL 34.60 (L) 06/09/2018   LDLCALC 114 (H) 06/09/2018   TRIG 129.0 06/09/2018   CHOLHDL 5 06/09/2018   HYPERTENSION currently being treated with diltiazem, and lisinopril 40 mg Followed by nephrologist  BP Readings from Last 3 Encounters:  04/11/18 110/70  04/07/18 (!) 155/98  03/21/18 (!) 164/101     CRF: Followed by nephrologist   Lab Results  Component Value Date   CREATININE 1.72 (H) 06/09/2018   CREATININE 1.49 04/01/2018   CREATININE 1.73 (H) 02/20/2018    NEUROPATHY: Currently taking 300 mg gabapentin at bedtime for pain control However he is having more symptoms of numbness in his feet and legs and not as much paresthesia He says he is trying to take Tylenol and tramadol for this without relief  LABS:  Lab on 06/09/2018  Component Date Value Ref Range Status  . Cholesterol 06/09/2018 174  0 - 200 mg/dL Final   ATP III Classification  Desirable:  < 200 mg/dL               Borderline High:  200 - 239 mg/dL          High:  > = 240 mg/dL  . Triglycerides 06/09/2018 129.0  0.0 - 149.0 mg/dL Final   Normal:  <150 mg/dLBorderline High:  150 - 199 mg/dL  . HDL 06/09/2018 34.60* >39.00 mg/dL Final  . VLDL  06/09/2018 25.8  0.0 - 40.0 mg/dL Final  . LDL Cholesterol 06/09/2018 114* 0 - 99 mg/dL Final  . Total CHOL/HDL Ratio 06/09/2018 5   Final                  Men          Women1/2 Average Risk     3.4          3.3Average Risk          5.0          4.42X Average Risk          9.6          7.13X Average Risk          15.0          11.0                      . NonHDL 06/09/2018 139.66   Final   NOTE:  Non-HDL goal should be 30 mg/dL higher than patient's LDL goal (i.e. LDL goal of < 70 mg/dL, would have non-HDL goal of < 100 mg/dL)  . Fructosamine 06/09/2018 193  0 - 285 umol/L Final   Comment: Published reference interval for apparently healthy subjects between age 77 and 36 is 39 - 285 umol/L and in a poorly controlled diabetic population is 228 - 563 umol/L with a mean of 396 umol/L.   Marland Kitchen Sodium 06/09/2018 139  135 - 145 mEq/L Final  . Potassium 06/09/2018 3.8  3.5 - 5.1 mEq/L Final  . Chloride 06/09/2018 107  96 - 112 mEq/L Final  . CO2 06/09/2018 26  19 - 32 mEq/L Final  . Glucose, Bld 06/09/2018 158* 70 - 99 mg/dL Final  . BUN 06/09/2018 15  6 - 23 mg/dL Final  . Creatinine, Ser 06/09/2018 1.72* 0.40 - 1.50 mg/dL Final  . Calcium 06/09/2018 8.1* 8.4 - 10.5 mg/dL Final  . GFR 06/09/2018 53.10* >60.00 mL/min Final  . Microalb, Ur 06/09/2018 419.3* 0.0 - 1.9 mg/dL Final  . Creatinine,U 06/09/2018 352.4  mg/dL Final  . Microalb Creat Ratio 06/09/2018 119.0* 0.0 - 30.0 mg/g Final    Physical Examination:  There were no vitals taken for this visit.      ASSESSMENT:  Diabetes type 1, long-standing with obesity  He has had  persistent poor control associated with multiple complications   A1O is last 9.5, previously 11.8  Although his fructosamine is lower at 193 he also has a low albumin level and not clear if this is accurate  On basal bolus insulin but he is not able to follow instructions for adjusting his insulin and not understanding the adjustment of basal and bolus insulins   As above he is likely not getting enough basal insulin and mealtime insulin during the day He tends to have lower readings early morning but has not adjusted his Lantus accordingly Highest blood sugars appear to be 3 to 4 hours after his evening meal Also not adjusting his mealtime insulin based on  meal size and carbohydrate intake  He is interested in the continuous sugars monitor and may be able to get this covered  HYPERTENSION: To be followed by PCP and nephrologist  NEUROPATHY: He is having increased symptoms but mostly numbness not relieved by only low-dose gabapentin and tramadol  Nephropathy: Still has significant proteinuria to be followed by nephrologist  PLAN:    Check blood sugars 4 times a day, message was also sent on my chart to the patient  Increase suppertime Humalog again by 5 units for most meals and take based on meal size and amount of carbohydrates  Also needs to make sure his blood sugars after dinner are under 180  Reduce Lantus in the evening to reduce tendency to overnight hypoglycemia  Need information on postprandial readings after breakfast  Patient instructions sent via my chart message as follows: Start checking blood sugars consistently about 2 hours after both breakfast and dinner Blood sugar target should be at least under 180 preferably 160 after 2 hours and 140 at bedtime  Increase suppertime Humalog by at least 5 units compared to current doses and may need another 2 to 4 units more if most of the readings after supper are still high At the same time reduce the Lantus to 35 units in the evening while increasing Humalog You may also need another 2 to 4 units at least of NovoLog at breakfast time since blood sugars in the late afternoon are also high Start walking for exercise  May take additional gabapentin 1 capsule at bedtime for numbness and as needed up to 3 times a day during the day You should do this instead of taking Tylenol or tramadol   Please discuss your cholesterol results with PCP, may need higher dose of atorvastatin  There are no Patient Instructions on file for this visit.   Counseling time on subjects discussed in assessment and plan sections is over 50% of today's 25 minute visit     Elayne Snare 06/11/2018, 3:18 PM   Note: This note was prepared with Dragon voice recognition system technology. Any transcriptional errors that result from this process are unintentional.

## 2018-06-18 ENCOUNTER — Telehealth: Payer: Self-pay | Admitting: Family Medicine

## 2018-06-18 ENCOUNTER — Ambulatory Visit: Payer: Medicare Other | Attending: Family Medicine | Admitting: Family Medicine

## 2018-06-18 ENCOUNTER — Encounter: Payer: Self-pay | Admitting: Family Medicine

## 2018-06-18 ENCOUNTER — Other Ambulatory Visit: Payer: Self-pay

## 2018-06-18 DIAGNOSIS — E0843 Diabetes mellitus due to underlying condition with diabetic autonomic (poly)neuropathy: Secondary | ICD-10-CM

## 2018-06-18 DIAGNOSIS — N183 Chronic kidney disease, stage 3 (moderate): Secondary | ICD-10-CM | POA: Diagnosis not present

## 2018-06-18 DIAGNOSIS — E1122 Type 2 diabetes mellitus with diabetic chronic kidney disease: Secondary | ICD-10-CM | POA: Diagnosis not present

## 2018-06-18 DIAGNOSIS — R600 Localized edema: Secondary | ICD-10-CM

## 2018-06-18 MED ORDER — DULOXETINE HCL 60 MG PO CPEP: 60.0000 mg | ORAL_CAPSULE | Freq: Every day | ORAL | 3 refills | Status: DC

## 2018-06-18 MED ORDER — FUROSEMIDE 40 MG PO TABS: 60.0000 mg | ORAL_TABLET | Freq: Two times a day (BID) | ORAL | 3 refills | Status: DC

## 2018-06-18 MED FILL — DULoxetine HCL 60 MG CPEP: 60 | 30 days supply | Qty: 30 | Fill #0

## 2018-06-18 MED FILL — FUROSEMIDE 40 MG TAB: 40 | 30 days supply | Qty: 90 | Fill #0

## 2018-06-18 NOTE — Progress Notes (Signed)
Patient has been called and DOB has been verified. Patient has been screened and transferred to PCP to start phone visit.  Numbness and tingling in legs that does not go away.

## 2018-06-18 NOTE — Telephone Encounter (Signed)
Patient states he is having numbness/tingly in his legs and would like for someone to reach back out. Please follow up

## 2018-06-18 NOTE — Progress Notes (Signed)
Virtual Visit via Telephone Note  I connected with Duane [name], on 06/18/2018 at 2:21 PM by telephone due to the COVID-19 pandemic and verified that I am speaking with the correct person using two identifiers.   Consent: I discussed the limitations, risks, security and privacy concerns of performing an evaluation and management service by telephone and the availability of in person appointments. I also discussed with the patient that there may be a patient responsible charge related to this service. The patient expressed understanding and agreed to proceed.   Location of Patient: Home  Location of Provider: Clinic   Persons participating in Telemedicine visit: Erland Welp  Alycia Farrington - CMA Dr  - PCP     History of Present Illness: Duane [name] is a 42-year-old male with history of type 1 diabetes mellitus (A1c 9.5), diabetic neuropathy, diabetic gastroparesis hypertension, stage III chronic kidney disease, depression who presents today for a follow-up visit. He complains of paresthesias in his legs which occur when he is sitting and this is persistent, resolution is unpredictable.  He has had some resolution after walking around and stretching his legs.  Episodes are also worse when he sits on the toilet.  He does have underlying diabetic neuropathy for which he has been on gabapentin at bedtime but states gabapentin does cause blurry vision and he has a hard time seeing his phone and the TV at bedtime. His diabetes He also has bilateral pedal edema and is on Lasix 40 mg twice daily which was increased to 60 mg twice daily by his nephrologist however he has failed to increase the dose due to the fact that Lasix does cause him to be dehydrated..    Past Medical History:  Diagnosis Date  . Allergy   . Arthritis   . Chronic kidney disease   . Depression   . Diabetes mellitus without complication (HCC) age 27   insulin requiring from the start  . Diabetic neuropathy  (HCC) Dx 20110  . Diabetic proliferative retinopathy ([facility]) 08/02/2017  . Gastritis   . Gastroparesis 2012   100% gastric retention on 08/2013 GES  . GERD (gastroesophageal reflux disease) Dx 2012  . Hyperlipidemia   . Hypertension Dx 2012  . Neuromuscular disorder ([facility])   . Obesity    No Known Allergies  Current Outpatient Medications on File Prior to Visit  Medication Sig Dispense Refill  . atorvastatin (LIPITOR) 20 MG tablet Take 1 tablet (20 mg total) by mouth daily. 30 tablet 6  . Blood Glucose Monitoring Suppl (TRUE METRIX METER) W/DEVICE KIT 1 each by Does not apply route 4 (four) times daily -  before meals and at bedtime. 1 kit ea  . diltiazem (CARTIA XT) 180 MG 24 hr capsule Take 1 capsule (180 mg total) by mouth daily. 30 capsule 6  . furosemide (LASIX) 40 MG tablet Take 1 tablet (40 mg total) by mouth daily. Hold Tomorrow's Dose and resume 12/26/17 (Patient taking differently: Take 40 mg by mouth daily. ) 30 tablet 6  . gabapentin (NEURONTIN) 300 MG capsule Take 1 capsule (300 mg total) by mouth at bedtime. 30 capsule 6  . glucose blood (CONTOUR NEXT TEST) test strip Use as instructed 100 each 12  . glucose blood (CONTOUR NEXT TEST) test strip Use as instructed 100 each 12  . glucose blood (TRUE METRIX BLOOD GLUCOSE TEST) test strip 1 each by Other route 3 (three) times daily. 100 each 11  . HUMALOG KWIKPEN 100 UNIT/ML KwikPen INJECT 15 UNITS INTO   THE SKIN 3 TIMES DAILY. (Patient taking differently: Inject 15-20 Units into the skin 3 (three) times daily. Per sliding scale) 15 mL 2  . hyoscyamine (LEVSIN) 0.125 MG tablet Take 1 tablet (0.125 mg total) by mouth 3 (three) times daily with meals as needed. (Patient taking differently: Take 0.125 mg by mouth 3 (three) times daily with meals as needed for bladder spasms or cramping. ) 90 tablet 4  . Insulin Glargine (LANTUS SOLOSTAR) 100 UNIT/ML Solostar Pen Inject 35 Units into the skin 2 (two) times daily. (Patient taking differently:  Inject 40 Units into the skin 2 (two) times daily. ) 90 mL 3  . Insulin Pen Needle 31G X 8 MM MISC Inject 1 each into the skin 4 (four) times daily. Check blood sugar TID and QHS 120 each 11  . lisinopril (ZESTRIL) 40 MG tablet TAKE 1 TABLET (40 MG TOTAL) BY MOUTH DAILY. 90 tablet 0  . metoCLOPramide (REGLAN) 10 MG tablet TAKE 1 TABLET 3 TIMES DAILY BEFORE MEALS AS NEEDED FOR NAUSEA 60 tablet 3  . Pancrelipase, Lip-Prot-Amyl, (ZENPEP) 40000-126000 units CPEP Take 2 capsules by mouth 3 (three) times daily with meals. Take during meal 180 capsule 11  . pantoprazole (PROTONIX) 40 MG tablet TAKE 1 TABLET (40 MG TOTAL) BY MOUTH DAILY. 30 tablet 2  . Peppermint Oil (IBGARD) 90 MG CPCR Take 2 capsules by mouth 3 (three) times daily as needed. (Patient taking differently: Take 1 capsule by mouth 3 (three) times daily. ) 16 capsule 0  . potassium chloride (MICRO-K) 10 MEQ CR capsule Take 1 capsule (10 mEq total) by mouth daily. 30 capsule 3  . promethazine (PHENERGAN) 25 MG tablet TAKE 1 TABLET BY MOUTH EVERY 8 HOURS AS NEEDED FOR NAUSEA OR VOMITING. (Patient taking differently: Take 25 mg by mouth every 6 (six) hours as needed for nausea or vomiting. ) 60 tablet 0  . sucralfate (CARAFATE) 1 g tablet Take 1 tablet (1 g total) by mouth 4 (four) times daily. 120 tablet 1  . traMADol (ULTRAM) 50 MG tablet Take 1 tablet (50 mg total) by mouth every 6 (six) hours as needed. (Patient taking differently: Take 50 mg by mouth every 6 (six) hours as needed for moderate pain. ) 30 tablet 0  . TRUEPLUS LANCETS 28G MISC 1 each by Does not apply route 4 (four) times daily -  before meals and at bedtime. 100 each 12   No current facility-administered medications on file prior to visit.     Observations/Objective: Awake, alert. Oriented x3 Not in acute distress  CMP Latest Ref Rng & Units 06/09/2018 04/01/2018 02/20/2018  Glucose 70 - 99 mg/dL 158(H) 120(H) 169(H)  BUN 6 - 23 mg/dL _0 Creatinine 0.40 - 1.50 mg/dL  1.72(H) 1.49 1.73(H)  Sodium 135 - 145 mEq/L 139 139 138  Potassium 3.5 - 5.1 mEq/L 3.8 3.8 4.5  Chloride 96 - 112 mEq/L 107 108 107  CO2 19 - 32 mEq/L _1 Calcium 8.4 - 10.5 mg/dL 8.1(L) 8.6 9.2  Total Protein 6.0 - 8.3 g/dL - 6.4 -  Total Bilirubin 0.2 - 1.2 mg/dL - 0.3 -  Alkaline Phos 39 - 117 U/L - 89 -  AST 0 - 37 U/L - 18 -  ALT 0 - 53 U/L - 20 -    Lipid Panel     Component Value Date/Time   CHOL 174 06/09/2018 1019   CHOL 138 05/17/2017 1026   TRIG 129.0 06/09/2018 1019  HDL 34.60 (L) 06/09/2018 1019   HDL 29 (L) 05/17/2017 1026   CHOLHDL 5 06/09/2018 1019   VLDL 25.8 06/09/2018 1019   LDLCALC 114 (H) 06/09/2018 1019   LDLCALC 65 05/17/2017 1026    Lab Results  Component Value Date   HGBA1C 9.5 (H) 04/01/2018     Assessment and Plan: 1. Diabetic autonomic neuropathy associated with diabetes mellitus due to underlying condition (HCC) Uncontrolled with A1c of 9.5 Diabetes is managed by Endocrinology - Dr Kumar; last visit was last week Neuropathy is uncontrolled and could be worsened by pedal edema. Unable to increase Gabapentin dose due to complaints of blurry vision hence substitution with Cymbalta and if still uncontrolled will switch to Lyrica - DULoxetine (CYMBALTA) 60 MG capsule; Take 1 capsule (60 mg total) by mouth daily.  Dispense: 30 capsule; Refill: 3  2. Pedal edema Advised to increase Lasix from 40 to 60mg as per Nephrology recommendations Use compression stocking CKD contributing as well Increase fluid intake to prevent dehydration - furosemide (LASIX) 40 MG tablet; Take 1.5 tablets (60 mg total) by mouth 2 (two) times daily.  Dispense: 90 tablet; Refill: 3  3. CKD stage 3 secondary to diabetes (HCC) Combination of Hypertensive and Diabetic Nephropathy Avoid Nephrotoxins Management as per Nephrology   Follow Up Instructions: Return in about 3 months (around 09/18/2018).    I discussed the assessment and treatment plan with the  patient. The patient was provided an opportunity to ask questions and all were answered. The patient agreed with the plan and demonstrated an understanding of the instructions.   The patient was advised to call back or seek an in-person evaluation if the symptoms worsen or if the condition fails to improve as anticipated.     I provided 16 minutes total of non-face-to-face time during this encounter including median intraservice time, reviewing previous notes, labs, imaging, medications and explaining diagnosis and management.      , MD, FAAFP. [facility] [place], [place] [phone]   06/18/2018, 2:21 PM  

## 2018-06-18 NOTE — Telephone Encounter (Signed)
Patient has been set up with a telephone visit.

## 2018-06-19 ENCOUNTER — Other Ambulatory Visit: Payer: Self-pay | Admitting: Family Medicine

## 2018-06-19 DIAGNOSIS — E1042 Type 1 diabetes mellitus with diabetic polyneuropathy: Secondary | ICD-10-CM

## 2018-06-19 DIAGNOSIS — E1043 Type 1 diabetes mellitus with diabetic autonomic (poly)neuropathy: Secondary | ICD-10-CM

## 2018-06-19 DIAGNOSIS — K3184 Gastroparesis: Secondary | ICD-10-CM

## 2018-06-19 MED FILL — PANTOPRAZOLE SOD DR 40 MG T: 40 | 30 days supply | Qty: 30 | Fill #1

## 2018-06-19 MED FILL — METOCLOPRAMIDE 10 MG TABLET: 10 | 20 days supply | Qty: 60 | Fill #1

## 2018-06-19 MED FILL — HYOSCYAMINE SULF 0.125 MG T: 0.125 | 30 days supply | Qty: 90 | Fill #4

## 2018-06-20 MED FILL — SUCRALFATE 1 GM TABLET: 1 | 30 days supply | Qty: 120 | Fill #0

## 2018-06-20 MED FILL — $HUMALOG 100 UNITS/ML KWIKP: 100 | 66 days supply | Qty: 30 | Fill #0

## 2018-06-23 ENCOUNTER — Other Ambulatory Visit: Payer: Self-pay | Admitting: Family Medicine

## 2018-06-23 DIAGNOSIS — E1042 Type 1 diabetes mellitus with diabetic polyneuropathy: Secondary | ICD-10-CM

## 2018-06-23 MED FILL — CARTIA XT 180 MG CAPSULE SA: 180 | 30 days supply | Qty: 30 | Fill #6

## 2018-06-23 MED FILL — ATORVASTATIN 20 MG TABLET: 20 | 30 days supply | Qty: 30 | Fill #0

## 2018-06-30 MED FILL — TRUE METRIX TEST STRIP: 25 days supply | Qty: 100 | Fill #1

## 2018-07-14 ENCOUNTER — Encounter: Payer: Self-pay | Admitting: Family Medicine

## 2018-07-14 ENCOUNTER — Other Ambulatory Visit: Payer: Self-pay

## 2018-07-14 ENCOUNTER — Ambulatory Visit: Payer: Medicare Other | Attending: Family Medicine | Admitting: Family Medicine

## 2018-07-14 VITALS — BP 143/87 | HR 102 | Temp 97.8°F | Ht 76.0 in | Wt 353.0 lb

## 2018-07-14 DIAGNOSIS — K3184 Gastroparesis: Secondary | ICD-10-CM | POA: Insufficient documentation

## 2018-07-14 DIAGNOSIS — F329 Major depressive disorder, single episode, unspecified: Secondary | ICD-10-CM | POA: Diagnosis not present

## 2018-07-14 DIAGNOSIS — Z79899 Other long term (current) drug therapy: Secondary | ICD-10-CM | POA: Insufficient documentation

## 2018-07-14 DIAGNOSIS — Z6841 Body Mass Index (BMI) 40.0 and over, adult: Secondary | ICD-10-CM

## 2018-07-14 DIAGNOSIS — N183 Chronic kidney disease, stage 3 (moderate): Secondary | ICD-10-CM | POA: Diagnosis not present

## 2018-07-14 DIAGNOSIS — IMO0002 Reserved for concepts with insufficient information to code with codable children: Secondary | ICD-10-CM

## 2018-07-14 DIAGNOSIS — M1990 Unspecified osteoarthritis, unspecified site: Secondary | ICD-10-CM | POA: Insufficient documentation

## 2018-07-14 DIAGNOSIS — K219 Gastro-esophageal reflux disease without esophagitis: Secondary | ICD-10-CM | POA: Diagnosis not present

## 2018-07-14 DIAGNOSIS — E785 Hyperlipidemia, unspecified: Secondary | ICD-10-CM | POA: Diagnosis not present

## 2018-07-14 DIAGNOSIS — I10 Essential (primary) hypertension: Secondary | ICD-10-CM | POA: Diagnosis not present

## 2018-07-14 DIAGNOSIS — E1022 Type 1 diabetes mellitus with diabetic chronic kidney disease: Secondary | ICD-10-CM | POA: Insufficient documentation

## 2018-07-14 DIAGNOSIS — Z794 Long term (current) use of insulin: Secondary | ICD-10-CM | POA: Insufficient documentation

## 2018-07-14 DIAGNOSIS — E1042 Type 1 diabetes mellitus with diabetic polyneuropathy: Secondary | ICD-10-CM

## 2018-07-14 DIAGNOSIS — E1065 Type 1 diabetes mellitus with hyperglycemia: Secondary | ICD-10-CM | POA: Diagnosis not present

## 2018-07-14 DIAGNOSIS — E1043 Type 1 diabetes mellitus with diabetic autonomic (poly)neuropathy: Secondary | ICD-10-CM | POA: Insufficient documentation

## 2018-07-14 DIAGNOSIS — I129 Hypertensive chronic kidney disease with stage 1 through stage 4 chronic kidney disease, or unspecified chronic kidney disease: Secondary | ICD-10-CM | POA: Insufficient documentation

## 2018-07-14 DIAGNOSIS — R600 Localized edema: Secondary | ICD-10-CM | POA: Insufficient documentation

## 2018-07-14 LAB — POCT GLYCOSYLATED HEMOGLOBIN (HGB A1C): HbA1c, POC (controlled diabetic range): 8.5 % — AB (ref 0.0–7.0)

## 2018-07-14 LAB — GLUCOSE, POCT (MANUAL RESULT ENTRY): POC Glucose: 113 mg/dl — AB (ref 70–99)

## 2018-07-14 MED ORDER — METOCLOPRAMIDE HCL 10 MG PO TABS: ORAL_TABLET | ORAL | 3 refills | Status: DC

## 2018-07-14 MED ORDER — INSULIN GLARGINE 100 UNIT/ML SOLOSTAR PEN: 40.0000 [IU] | PEN_INJECTOR | Freq: Two times a day (BID) | SUBCUTANEOUS | 6 refills | Status: DC

## 2018-07-14 MED ORDER — PANTOPRAZOLE SODIUM 40 MG PO TBEC: 40.0000 mg | DELAYED_RELEASE_TABLET | Freq: Two times a day (BID) | ORAL | 3 refills | Status: DC

## 2018-07-14 MED FILL — !LANTUS SOLOSTAR 100UNITS/M: 100 | 38 days supply | Qty: 30 | Fill #0

## 2018-07-14 MED FILL — PANTOPRAZOLE SOD DR 40 MG T: 40 | 30 days supply | Qty: 60 | Fill #0

## 2018-07-14 MED FILL — METOCLOPRAMIDE 10 MG TABLET: 10 | 20 days supply | Qty: 60 | Fill #0

## 2018-07-14 NOTE — Progress Notes (Signed)
Patient is having more than usual leg numbness.  Patient is having real bad GRED.

## 2018-07-14 NOTE — Progress Notes (Signed)
Subjective:  Patient ID: Duane [name], male    DOB: 09-Nov-1975  Age: 43 y.o. MRN: [id number]  CC: Diabetes and Gastroesophageal Reflux   HPI Duane [name] is a 43 year old male with history of type 1 diabetes mellitus (A1c 8.5), diabetic neuropathy, diabetic gastroparesis hypertension, stage III chronic kidney disease, depression who presents today for an acute visit. He complains of worsening of his reflux with associated fullness in his stomach and bad breath when he burps he has this foul smell come out of his stomach.  He is currently on Reglan for gastroparesis and Protonix once daily.  Has not been to see his GI Dr. Carlean [name] in a while.  He also complains of worsening diabetic neuropathy and has been taking Cymbalta but quit taking gabapentin ever since Cymbalta was added to his regimen.  He had complained of blurry vision with gabapentin.  The neuropathy symptoms are worsened when he has worsening pedal edema or he sits with his legs bent.  Past Medical History:  Diagnosis Date  . Allergy   . Arthritis   . Chronic kidney disease   . Depression   . Diabetes mellitus without complication [facility]) age 49   insulin requiring from the start  . Diabetic neuropathy ([place]) Dx 20110  . Diabetic proliferative retinopathy ([place]) 08/02/2017  . Gastritis   . Gastroparesis 2012   100% gastric retention on 08/2013 GES  . GERD (gastroesophageal reflux disease) Dx 2012  . Hyperlipidemia   . Hypertension Dx 2012  . Neuromuscular disorder ([place])   . Obesity     Past Surgical History:  Procedure Laterality Date  . CHOLECYSTECTOMY  2013  . ESOPHAGOGASTRODUODENOSCOPY N/A 06/11/2013   Procedure: ESOPHAGOGASTRODUODENOSCOPY (EGD);  Surgeon: Duane Mayer, MD;  Location: [facility];  Service: Endoscopy;  Laterality: N/A;  . EYE SURGERY    . FLEXIBLE SIGMOIDOSCOPY N/A 06/11/2013   Procedure: FLEXIBLE SIGMOIDOSCOPY;  Surgeon: Duane Mayer, MD;  Location: [facility];  Service:  Endoscopy;  Laterality: N/A;  . KNEE SURGERY Left     Family History  Problem Relation Age of Onset  . Diabetes Father   . Diabetes Maternal Grandmother   . Diabetes Paternal Grandmother   . Colon cancer Cousin   . Stomach cancer Neg Hx   . Rectal cancer Neg Hx     No Known Allergies  Outpatient Medications Prior to Visit  Medication Sig Dispense Refill  . atorvastatin (LIPITOR) 20 MG tablet TAKE 1 TABLET (20 MG TOTAL) BY MOUTH DAILY. 30 tablet 2  . Blood Glucose Monitoring Suppl (TRUE METRIX METER) W/DEVICE KIT 1 each by Does not apply route 4 (four) times daily -  before meals and at bedtime. 1 kit ea  . diltiazem (CARTIA XT) 180 MG 24 hr capsule Take 1 capsule (180 mg total) by mouth daily. 30 capsule 6  . DULoxetine (CYMBALTA) 60 MG capsule Take 1 capsule (60 mg total) by mouth daily. 30 capsule 3  . furosemide (LASIX) 40 MG tablet Take 1.5 tablets (60 mg total) by mouth 2 (two) times daily. 90 tablet 3  . gabapentin (NEURONTIN) 300 MG capsule Take 1 capsule (300 mg total) by mouth at bedtime. 30 capsule 6  . glucose blood (CONTOUR NEXT TEST) test strip Use as instructed 100 each 12  . glucose blood (CONTOUR NEXT TEST) test strip Use as instructed 100 each 12  . glucose blood (TRUE METRIX BLOOD GLUCOSE TEST) test strip 1 each by Other route 3 (three) times daily. 100 each  11  . HUMALOG KWIKPEN 100 UNIT/ML KwikPen INJECT 15 UNITS INTO THE SKIN 3 TIMES DAILY. 15 mL 2  . hyoscyamine (LEVSIN) 0.125 MG tablet Take 1 tablet (0.125 mg total) by mouth 3 (three) times daily with meals as needed. (Patient taking differently: Take 0.125 mg by mouth 3 (three) times daily with meals as needed for bladder spasms or cramping. ) 90 tablet 4  . Insulin Pen Needle 31G X 8 MM MISC Inject 1 each into the skin 4 (four) times daily. Check blood sugar TID and QHS 120 each 11  . lisinopril (ZESTRIL) 40 MG tablet TAKE 1 TABLET (40 MG TOTAL) BY MOUTH DAILY. 90 tablet 0  . Pancrelipase, Lip-Prot-Amyl,  (ZENPEP) 40000-126000 units CPEP Take 2 capsules by mouth 3 (three) times daily with meals. Take during meal 180 capsule 11  . Peppermint Oil (IBGARD) 90 MG CPCR Take 2 capsules by mouth 3 (three) times daily as needed. (Patient taking differently: Take 1 capsule by mouth 3 (three) times daily. ) 16 capsule 0  . potassium chloride (MICRO-K) 10 MEQ CR capsule Take 1 capsule (10 mEq total) by mouth daily. 30 capsule 3  . promethazine (PHENERGAN) 25 MG tablet TAKE 1 TABLET BY MOUTH EVERY 8 HOURS AS NEEDED FOR NAUSEA OR VOMITING. (Patient taking differently: Take 25 mg by mouth every 6 (six) hours as needed for nausea or vomiting. ) 60 tablet 0  . sucralfate (CARAFATE) 1 g tablet TAKE 1 TABLET (1 G TOTAL) BY MOUTH 4 (FOUR) TIMES DAILY. 120 tablet 1  . traMADol (ULTRAM) 50 MG tablet Take 1 tablet (50 mg total) by mouth every 6 (six) hours as needed. (Patient taking differently: Take 50 mg by mouth every 6 (six) hours as needed for moderate pain. ) 30 tablet 0  . TRUEPLUS LANCETS 28G MISC 1 each by Does not apply route 4 (four) times daily -  before meals and at bedtime. 100 each 12  . Insulin Glargine (LANTUS SOLOSTAR) 100 UNIT/ML Solostar Pen Inject 35 Units into the skin 2 (two) times daily. (Patient taking differently: Inject 40 Units into the skin 2 (two) times daily. ) 90 mL 3  . metoCLOPramide (REGLAN) 10 MG tablet TAKE 1 TABLET 3 TIMES DAILY BEFORE MEALS AS NEEDED FOR NAUSEA 60 tablet 3  . pantoprazole (PROTONIX) 40 MG tablet TAKE 1 TABLET (40 MG TOTAL) BY MOUTH DAILY. 30 tablet 2   No facility-administered medications prior to visit.      ROS Review of Systems  Constitutional: Negative for activity change and appetite change.  HENT: Negative for sinus pressure and sore throat.   Eyes: Negative for visual disturbance.  Respiratory: Negative for cough, chest tightness and shortness of breath.   Cardiovascular: Negative for chest pain and leg swelling.  Gastrointestinal: Negative for abdominal  distention, abdominal pain, constipation and diarrhea.  Endocrine: Negative.   Genitourinary: Negative for dysuria.  Musculoskeletal: Negative for joint swelling and myalgias.  Skin: Negative for rash.  Allergic/Immunologic: Negative.   Neurological: Negative for weakness, light-headedness and numbness.  Psychiatric/Behavioral: Negative for dysphoric mood and suicidal ideas.    Objective:  BP (!) 143/87   Pulse (!) 102   Temp 97.8 F (36.6 C) (Oral)   Ht _0  (1.93 m)   Wt (!) 353 lb (160.1 kg)   SpO2 99%   BMI 42.97 kg/m   BP/Weight 07/14/2018 04/11/2018 10/29/9148  Systolic BP 569 794 801  Diastolic BP 87 70 98  Wt. (Lbs) 353 357.8 353.6  BMI 42.97  43.55 43.04      Physical Exam Constitutional:      Appearance: He is well-developed.  Cardiovascular:     Rate and Rhythm: Tachycardia present.     Heart sounds: Normal heart sounds. No murmur.     Comments: Unable to palpate dorsalis pedis due to edema Pulmonary:     Effort: Pulmonary effort is normal.     Breath sounds: Normal breath sounds. No wheezing or rales.  Chest:     Chest wall: No tenderness.  Abdominal:     General: Bowel sounds are normal. There is no distension.     Palpations: Abdomen is soft. There is no mass.     Tenderness: There is no abdominal tenderness.  Musculoskeletal: Normal range of motion.     Right lower leg: Edema present.     Left lower leg: Edema present.  Neurological:     Mental Status: He is alert and oriented to person, place, and time.      CMP Latest Ref Rng & Units 06/09/2018 04/01/2018 02/20/2018  Glucose 70 - 99 mg/dL 158(H) 120(H) 169(H)  BUN 6 - 23 mg/dL _0 Creatinine 0.40 - 1.50 mg/dL 1.72(H) 1.49 1.73(H)  Sodium 135 - 145 mEq/L 139 139 138  Potassium 3.5 - 5.1 mEq/L 3.8 3.8 4.5  Chloride 96 - 112 mEq/L 107 108 107  CO2 19 - 32 mEq/L _1 Calcium 8.4 - 10.5 mg/dL 8.1(L) 8.6 9.2  Total Protein 6.0 - 8.3 g/dL - 6.4 -  Total Bilirubin 0.2 - 1.2 mg/dL - 0.3 -   Alkaline Phos 39 - 117 U/L - 89 -  AST 0 - 37 U/L - 18 -  ALT 0 - 53 U/L - 20 -    Lipid Panel     Component Value Date/Time   CHOL 174 06/09/2018 1019   CHOL 138 05/17/2017 1026   TRIG 129.0 06/09/2018 1019   HDL 34.60 (L) 06/09/2018 1019   HDL 29 (L) 05/17/2017 1026   CHOLHDL 5 06/09/2018 1019   VLDL 25.8 06/09/2018 1019   LDLCALC 114 (H) 06/09/2018 1019   LDLCALC 65 05/17/2017 1026    CBC    Component Value Date/Time   WBC 7.3 12/24/2017 0752   RBC 4.06 (L) 12/24/2017 0752   HGB 10.8 (L) 12/24/2017 0752   HCT 34.7 (L) 12/24/2017 0752   PLT 318 12/24/2017 0752   MCV 85.5 12/24/2017 0752   MCH 26.6 12/24/2017 0752   MCHC 31.1 12/24/2017 0752   RDW 12.6 12/24/2017 0752   LYMPHSABS 2.3 12/24/2017 0752   MONOABS 0.6 12/24/2017 0752   EOSABS 0.3 12/24/2017 0752   BASOSABS 0.1 12/24/2017 0752    Lab Results  Component Value Date   HGBA1C 8.5 (A) 07/14/2018    Assessment & Plan:   1. Type I diabetes mellitus with complication, uncontrolled ([place]) Uncontrolled with A1c of 8.5 but this has trended down from 9.5 previously Management as per endocrine Counseled on Diabetic diet, my plate method, 161 minutes of moderate intensity exercise/week Keep blood sugar logs with fasting goals of 80-120 mg/dl, random of less than 180 and in the event of sugars less than 60 mg/dl or greater than 400 mg/dl please notify the clinic ASAP. It is recommended that you undergo annual eye exams and annual foot exams. Pneumonia vaccine is recommended. - POCT glucose (manual entry) - POCT glycosylated hemoglobin (Hb A1C)  2. Diabetic gastroparesis associated with type 1 diabetes mellitus ([place]) Uncontrolled with superimposed  reflux He may need upper endoscopy given abnormal breath he complains of Eat small meals frequently Increase Protonix to twice daily Advised to follow-up with GI-Dr. Carlean [name] - pantoprazole (PROTONIX) 40 MG tablet; Take 1 tablet (40 mg total) by mouth 2 (two) times  daily.  Dispense: 60 tablet; Refill: 3 - metoCLOPramide (REGLAN) 10 MG tablet; TAKE 1 TABLET 3 TIMES DAILY BEFORE MEALS AS NEEDED FOR NAUSEA  Dispense: 60 tablet; Refill: 3  3. Type 1 diabetes mellitus with diabetic polyneuropathy ([place]) Uncontrolled Could also be exacerbated by underlying pedal edema He has been taking Cymbalta but stopped the gabapentin Advised to combine gabapentin and Cymbalta If symptoms persist, will consider switching from gabapentin to Lyrica - Insulin Glargine (LANTUS SOLOSTAR) 100 UNIT/ML Solostar Pen; Inject 40 Units into the skin 2 (two) times daily.  Dispense: 30 mL; Refill: 6   Meds ordered this encounter  Medications  . pantoprazole (PROTONIX) 40 MG tablet    Sig: Take 1 tablet (40 mg total) by mouth 2 (two) times daily.    Dispense:  60 tablet    Refill:  3    Discontinue previous dose  . Insulin Glargine (LANTUS SOLOSTAR) 100 UNIT/ML Solostar Pen    Sig: Inject 40 Units into the skin 2 (two) times daily.    Dispense:  30 mL    Refill:  6    Discontinue previous dose  . metoCLOPramide (REGLAN) 10 MG tablet    Sig: TAKE 1 TABLET 3 TIMES DAILY BEFORE MEALS AS NEEDED FOR NAUSEA    Dispense:  60 tablet    Refill:  3    Follow-up: Return for medical conditions, keep previously scheduled appointment.       Charlott Rakes, MD, FAAFP. [facility], [place]   07/14/2018, 2:09 PM

## 2018-07-14 NOTE — Patient Instructions (Signed)
Gastroparesis  Gastroparesis is a condition in which food takes longer than normal to empty from the stomach. The condition is usually long-lasting (chronic). It may also be called delayed gastric emptying. There is no cure, but there are treatments and things that you can do at home to help relieve symptoms. Treating the underlying condition that causes gastroparesis can also help relieve symptoms. What are the causes? In many cases, the cause of this condition is not known. Possible causes include:  A hormone (endocrine) disorder, such as hypothyroidism or diabetes.  A nervous system disease, such as Parkinson's disease or multiple sclerosis.  Cancer, infection, or surgery that affects the stomach or vagus nerve. The vagus nerve runs from your chest, through your neck, to the lower part of your brain.  A connective tissue disorder, such as scleroderma.  Certain medicines. What increases the risk? You are more likely to develop this condition if you:  Have certain disorders or diseases, including: ? An endocrine disorder. ? An eating disorder. ? Amyloidosis. ? Scleroderma. ? Parkinson's disease. ? Multiple sclerosis. ? Cancer or infection of the stomach or the vagus nerve.  Have had surgery on the stomach or vagus nerve.  Take certain medicines.  Are male. What are the signs or symptoms? Symptoms of this condition include:  Feeling full after eating very little.  Nausea.  Vomiting.  Heartburn.  Abdominal bloating.  Inconsistent blood sugar (glucose) levels on blood tests.  Lack of appetite.  Weight loss.  Acid from the stomach coming up into the esophagus (gastroesophageal reflux).  Sudden tightening (spasm) of the stomach, which can be painful. Symptoms may come and go. Some people may not notice any symptoms. How is this diagnosed? This condition is diagnosed with tests, such as:  Tests that check how long it takes food to move through the stomach and  intestines. These tests include: ? Upper gastrointestinal (GI) series. For this test, you drink a liquid that shows up well on X-rays, and then X-rays will be taken of your intestines. ? Gastric emptying scintigraphy. For this test, you eat food that contains a small amount of radioactive material, and then scans are taken. ? Wireless capsule GI monitoring system. For this test, you swallow a pill (capsule) that records information about how foods and fluid move through your stomach.  Gastric manometry. For this test, a tube is passed down your throat and into your stomach to measure electrical and muscular activity.  Endoscopy. For this test, a long, thin tube is passed down your throat and into your stomach to check for problems in your stomach lining.  Ultrasound. This test uses sound waves to create images of inside the body. This can help rule out gallbladder disease or pancreatitis as a cause of your symptoms. How is this treated? There is no cure for gastroparesis. Treatment may include:  Treating the underlying cause.  Managing your symptoms by making changes to your diet and exercise habits.  Taking medicines to control nausea and vomiting and to stimulate stomach muscles.  Getting food through a feeding tube in the hospital. This may be done in severe cases.  Having surgery to insert a device into your body that helps improve stomach emptying and control nausea and vomiting (gastric neurostimulator). Follow these instructions at home:  Take over-the-counter and prescription medicines only as told by your health care provider.  Follow instructions from your health care provider about eating or drinking restrictions. Your health care provider may recommend that you: ? Eat   about eating or drinking restrictions. Your health care provider may recommend that you:  Eat smaller meals more often.  Eat low-fat foods.  Eat low-fiber forms of high-fiber foods. For example, eat cooked vegetables instead of raw vegetables.  Have only liquid foods instead of solid foods. Liquid foods are easier to digest.  Drink enough fluid to keep your urine pale  yellow.  Exercise as often as told by your health care provider.  Keep all follow-up visits as told by your health care provider. This is important.  Contact a health care provider if you:  Notice that your symptoms do not improve with treatment.  Have new symptoms.  Get help right away if you:  Have severe abdominal pain that does not improve with treatment.  Have nausea that is severe or does not go away.  Cannot drink fluids without vomiting.  Summary  Gastroparesis is a chronic condition in which food takes longer than normal to empty from the stomach.  Symptoms include nausea, vomiting, heartburn, abdominal bloating, and loss of appetite.  Eating smaller portions, and low-fat, low-fiber foods may help you manage your symptoms.  Get help right away if you have severe abdominal pain.  This information is not intended to replace advice given to you by your health care provider. Make sure you discuss any questions you have with your health care provider.  Document Released: 01/29/2005 Document Revised: 12/04/2016 Document Reviewed: 12/04/2016  Elsevier Interactive Patient Education  2019 [employer]

## 2018-07-28 ENCOUNTER — Other Ambulatory Visit: Payer: Self-pay | Admitting: Physician Assistant

## 2018-07-28 ENCOUNTER — Other Ambulatory Visit: Payer: Self-pay | Admitting: Family Medicine

## 2018-07-28 DIAGNOSIS — I10 Essential (primary) hypertension: Secondary | ICD-10-CM

## 2018-07-28 MED FILL — DILTIAZEM 24HR ER 180 MG CA: 180 | 30 days supply | Qty: 30 | Fill #0

## 2018-07-28 MED FILL — DULoxetine HCL 60 MG CPEP: 60 | 30 days supply | Qty: 30 | Fill #1

## 2018-07-28 MED FILL — ATORVASTATIN 20 MG TABLET: 20 | 30 days supply | Qty: 30 | Fill #1

## 2018-07-28 MED FILL — HYOSCYAMINE SULF 0.125 MG T: 0.125 | 30 days supply | Qty: 90 | Fill #0

## 2018-08-08 ENCOUNTER — Other Ambulatory Visit: Payer: Medicare Other

## 2018-08-11 MED FILL — METOCLOPRAMIDE 10 MG TABLET: 10 | 20 days supply | Qty: 60 | Fill #1

## 2018-08-13 ENCOUNTER — Other Ambulatory Visit: Payer: Self-pay

## 2018-08-13 ENCOUNTER — Ambulatory Visit: Payer: Medicare Other | Admitting: Endocrinology

## 2018-08-13 DIAGNOSIS — Z0289 Encounter for other administrative examinations: Secondary | ICD-10-CM

## 2018-08-21 MED FILL — SUCRALFATE 1 GM TABLET: 1 | 30 days supply | Qty: 120 | Fill #1

## 2018-08-21 MED FILL — POTASSIUM CHLORIDE ER 10 ME: 10 | 30 days supply | Qty: 30 | Fill #1

## 2018-08-26 DIAGNOSIS — E559 Vitamin D deficiency, unspecified: Secondary | ICD-10-CM | POA: Diagnosis not present

## 2018-08-26 DIAGNOSIS — R609 Edema, unspecified: Secondary | ICD-10-CM | POA: Diagnosis not present

## 2018-08-26 DIAGNOSIS — N189 Chronic kidney disease, unspecified: Secondary | ICD-10-CM | POA: Diagnosis not present

## 2018-08-26 DIAGNOSIS — R809 Proteinuria, unspecified: Secondary | ICD-10-CM | POA: Diagnosis not present

## 2018-08-26 DIAGNOSIS — D631 Anemia in chronic kidney disease: Secondary | ICD-10-CM | POA: Diagnosis not present

## 2018-08-26 DIAGNOSIS — N183 Chronic kidney disease, stage 3 (moderate): Secondary | ICD-10-CM | POA: Diagnosis not present

## 2018-08-26 DIAGNOSIS — I129 Hypertensive chronic kidney disease with stage 1 through stage 4 chronic kidney disease, or unspecified chronic kidney disease: Secondary | ICD-10-CM | POA: Diagnosis not present

## 2018-08-26 DIAGNOSIS — E669 Obesity, unspecified: Secondary | ICD-10-CM | POA: Diagnosis not present

## 2018-08-28 DIAGNOSIS — R61 Generalized hyperhidrosis: Secondary | ICD-10-CM | POA: Diagnosis not present

## 2018-09-01 MED FILL — DULoxetine HCL 60 MG CPEP: 60 | 30 days supply | Qty: 30 | Fill #2

## 2018-09-01 MED FILL — VIT D2 1.25 MG (50,000 UNIT: 1.25 MG | 84 days supply | Qty: 12 | Fill #0

## 2018-09-01 MED FILL — PANTOPRAZOLE SOD DR 40 MG T: 40 | 30 days supply | Qty: 60 | Fill #1

## 2018-09-01 MED FILL — ATORVASTATIN 20 MG TABLET: 20 | 30 days supply | Qty: 30 | Fill #2

## 2018-09-01 MED FILL — DILTIAZEM 24HR ER 180 MG CA: 180 | 30 days supply | Qty: 30 | Fill #1

## 2018-09-01 MED FILL — $LANTUS SOLOSTAR 100 UNITS/: 100 | 37 days supply | Qty: 30 | Fill #1

## 2018-09-03 ENCOUNTER — Other Ambulatory Visit: Payer: Self-pay | Admitting: Family Medicine

## 2018-09-03 MED FILL — LISINOPRIL 40 MG TABLET: 40 | 30 days supply | Qty: 30 | Fill #0

## 2018-09-08 MED FILL — TRUE METRIX TEST STRIP: 25 days supply | Qty: 100 | Fill #2

## 2018-09-10 MED FILL — METOCLOPRAMIDE 10 MG TABLET: 10 | 20 days supply | Qty: 60 | Fill #2

## 2018-09-10 MED FILL — HYOSCYAMINE SULF 0.125 MG T: 0.125 | 30 days supply | Qty: 90 | Fill #1

## 2018-09-16 ENCOUNTER — Ambulatory Visit (INDEPENDENT_AMBULATORY_CARE_PROVIDER_SITE_OTHER): Payer: Medicare Other | Admitting: Internal Medicine

## 2018-09-16 ENCOUNTER — Encounter: Payer: Self-pay | Admitting: Internal Medicine

## 2018-09-16 VITALS — BP 152/90 | HR 83 | Temp 98.8°F | Ht 76.0 in | Wt 340.8 lb

## 2018-09-16 DIAGNOSIS — K3184 Gastroparesis: Secondary | ICD-10-CM

## 2018-09-16 DIAGNOSIS — K529 Noninfective gastroenteritis and colitis, unspecified: Secondary | ICD-10-CM | POA: Diagnosis not present

## 2018-09-16 DIAGNOSIS — E1043 Type 1 diabetes mellitus with diabetic autonomic (poly)neuropathy: Secondary | ICD-10-CM

## 2018-09-16 NOTE — Patient Instructions (Signed)
You have been scheduled for an endoscopy and colonoscopy. Please follow the written instructions given to you at your visit today. Please use the suprep kit you have at home. If you use inhalers (even only as needed), please bring them with you on the day of your procedure.   Do not take your zenpep , hold for now.   I appreciate the opportunity to care for you. Silvano Rusk, MD, [facility]

## 2018-09-16 NOTE — Progress Notes (Signed)
Duane [name] 43 y.o. 1975-07-18 [id number]  Assessment & Plan:   Encounter Diagnoses  Name Primary?  . Chronic diarrhea Yes  . Diabetic gastroparesis associated with type 1 diabetes mellitus ([place])    he has had a flexible sigmoidoscopy and EGD in the past.  That was in 2015.  I think we need to repeat the EGD and repeat a colonoscopy or do a full colonoscopy instead of a flex sig.  He could have a microscopic colitis.  Celiac testing is been negative before.  He could have inflammatory bowel disease.  I think that the fecal elastase may have been a false positive and that watery stools can dilute and make it low.  Hold Zenpep for now.  Consider higher doses depending upon what is seen.  He has failed cholestyramine he thinks though we may need to consider trying that again.  Or something similar.Lennox Pippins is not an option he is status post cholecystectomy.  Could consider repeating Xifaxan as well.  It is certainly quite possible that he has diabetic neuropathic diarrhea.  Treatment for that can be difficult.   I appreciate the opportunity to care for this patient. CC: Charlott Rakes, MD  Subjective:   Chief Complaint:  HPI Duane [name] is here for follow-up today, he has been followed with gastroparesis and chronic diarrhea thought possibly due to bacterial overgrowth as it has responded to Xifaxan off and on.  He is complaining of belching with foul odors, heartburn and indigestion and constant diarrhea.  Several times a day.  No rectal bleeding.  His diabetes is much better his hemoglobin A1c is now down to 8.5%.  He has known diabetic neuropathy however.  He will have 1 or 2 normal bowel movements out of the week but generally his bowel movements are loose and watery and up to several a day.  He avoids milk products he tried to eliminate cheese as well but that did not make any difference.  At one point he had a low fecal elastase and was put on Zenpep by 1 of our physician  assistants but he does not think that helps. No Known Allergies Current Meds  Medication Sig  . atorvastatin (LIPITOR) 20 MG tablet TAKE 1 TABLET (20 MG TOTAL) BY MOUTH DAILY.  Marland Kitchen Blood Glucose Monitoring Suppl (TRUE METRIX METER) W/DEVICE KIT 1 each by Does not apply route 4 (four) times daily -  before meals and at bedtime.  Marland Kitchen CARTIA XT 180 MG 24 hr capsule TAKE 1 CAPSULE (180 MG TOTAL) BY MOUTH DAILY.  . DULoxetine (CYMBALTA) 60 MG capsule Take 1 capsule (60 mg total) by mouth daily.  . furosemide (LASIX) 40 MG tablet Take 1.5 tablets (60 mg total) by mouth 2 (two) times daily.  Marland Kitchen gabapentin (NEURONTIN) 300 MG capsule Take 1 capsule (300 mg total) by mouth at bedtime.  Marland Kitchen glucose blood (CONTOUR NEXT TEST) test strip Use as instructed  . glucose blood (CONTOUR NEXT TEST) test strip Use as instructed  . glucose blood (TRUE METRIX BLOOD GLUCOSE TEST) test strip 1 each by Other route 3 (three) times daily.  Marland Kitchen HUMALOG KWIKPEN 100 UNIT/ML KwikPen INJECT 15 UNITS INTO THE SKIN 3 TIMES DAILY.  . hyoscyamine (LEVSIN) 0.125 MG tablet TAKE 1 TABLET (0.125 MG TOTAL) BY MOUTH 3 (THREE) TIMES DAILY WITH MEALS AS NEEDED.  Marland Kitchen Insulin Glargine (LANTUS SOLOSTAR) 100 UNIT/ML Solostar Pen Inject 40 Units into the skin 2 (two) times daily.  . Insulin Pen Needle 31G X 8  MM MISC Inject 1 each into the skin 4 (four) times daily. Check blood sugar TID and QHS  . lisinopril (ZESTRIL) 40 MG tablet TAKE 1 TABLET (40 MG TOTAL) BY MOUTH DAILY.  Marland Kitchen metoCLOPramide (REGLAN) 10 MG tablet TAKE 1 TABLET 3 TIMES DAILY BEFORE MEALS AS NEEDED FOR NAUSEA  . Pancrelipase, Lip-Prot-Amyl, (ZENPEP) 40000-126000 units CPEP Take 2 capsules by mouth 3 (three) times daily with meals. Take during meal  . pantoprazole (PROTONIX) 40 MG tablet Take 1 tablet (40 mg total) by mouth 2 (two) times daily.  Marland Kitchen Peppermint Oil (IBGARD) 90 MG CPCR Take 2 capsules by mouth 3 (three) times daily as needed. (Patient taking differently: Take 1 capsule by mouth  3 (three) times daily. )  . potassium chloride (MICRO-K) 10 MEQ CR capsule Take 1 capsule (10 mEq total) by mouth daily.  . promethazine (PHENERGAN) 25 MG tablet TAKE 1 TABLET BY MOUTH EVERY 8 HOURS AS NEEDED FOR NAUSEA OR VOMITING. (Patient taking differently: Take 25 mg by mouth every 6 (six) hours as needed for nausea or vomiting. )  . sucralfate (CARAFATE) 1 g tablet TAKE 1 TABLET (1 G TOTAL) BY MOUTH 4 (FOUR) TIMES DAILY.  . traMADol (ULTRAM) 50 MG tablet Take 1 tablet (50 mg total) by mouth every 6 (six) hours as needed. (Patient taking differently: Take 50 mg by mouth every 6 (six) hours as needed for moderate pain. )  . TRUEPLUS LANCETS 28G MISC 1 each by Does not apply route 4 (four) times daily -  before meals and at bedtime.   Past Medical History:  Diagnosis Date  . Allergy   . Arthritis   . Chronic kidney disease   . Depression   . Diabetes mellitus without complication [facility]) age 71   insulin requiring from the start  . Diabetic neuropathy ([place]) Dx 20110  . Diabetic proliferative retinopathy ([place]) 08/02/2017  . Gastritis   . Gastroparesis 2012   100% gastric retention on 08/2013 GES  . GERD (gastroesophageal reflux disease) Dx 2012  . Hyperlipidemia   . Hypertension Dx 2012  . Neuromuscular disorder ([place])   . Obesity    Past Surgical History:  Procedure Laterality Date  . CHOLECYSTECTOMY  2013  . ESOPHAGOGASTRODUODENOSCOPY N/A 06/11/2013   Procedure: ESOPHAGOGASTRODUODENOSCOPY (EGD);  Surgeon: [name], MD;  Location: [facility];  Service: Endoscopy;  Laterality: N/A;  . EYE SURGERY    . FLEXIBLE SIGMOIDOSCOPY N/A 06/11/2013   Procedure: FLEXIBLE SIGMOIDOSCOPY;  Surgeon: [name], MD;  Location: [facility];  Service: Endoscopy;  Laterality: N/A;  . KNEE SURGERY Left    Social History   Social History Narrative   He is single and a disabled security guard   1 son at least, I do not know if he has other children   No alcohol tobacco or drug use reported  no smoking   family history includes Colon cancer in his cousin; Diabetes in his father, maternal grandmother, and paternal grandmother.   Review of Systems As per HPI Objective:   Physical Exam BP (!) 152/90   Pulse 83   Temp 98.8 F (37.1 C) (Oral)   Ht _0  (1.93 m)   Wt (!) 340 lb 12.8 oz (154.6 kg)   BMI 41.48 kg/m  Well-developed well-nourished obese no acute distress Eyes anicteric Lungs clear Normal heart sounds Abdomen is obese soft nontender bowel sounds present Rectal exam deferred until colonoscopy  Appropriate mood and affect alert and oriented x3   Data reviewed  includes labs in the EMR, recent endocrinology and primary care visits, previous GI visits procedure reports

## 2018-09-19 ENCOUNTER — Telehealth: Payer: Self-pay | Admitting: Internal Medicine

## 2018-09-19 NOTE — Telephone Encounter (Signed)

## 2018-09-22 ENCOUNTER — Other Ambulatory Visit: Payer: Self-pay

## 2018-09-22 ENCOUNTER — Encounter: Payer: Self-pay | Admitting: Internal Medicine

## 2018-09-22 ENCOUNTER — Ambulatory Visit (AMBULATORY_SURGERY_CENTER): Payer: Medicare Other | Admitting: Internal Medicine

## 2018-09-22 VITALS — BP 153/86 | HR 77 | Temp 97.3°F | Resp 10 | Ht 76.0 in | Wt 340.0 lb

## 2018-09-22 DIAGNOSIS — K219 Gastro-esophageal reflux disease without esophagitis: Secondary | ICD-10-CM | POA: Diagnosis not present

## 2018-09-22 DIAGNOSIS — D123 Benign neoplasm of transverse colon: Secondary | ICD-10-CM | POA: Diagnosis not present

## 2018-09-22 DIAGNOSIS — E1143 Type 2 diabetes mellitus with diabetic autonomic (poly)neuropathy: Secondary | ICD-10-CM

## 2018-09-22 DIAGNOSIS — E1122 Type 2 diabetes mellitus with diabetic chronic kidney disease: Secondary | ICD-10-CM | POA: Diagnosis not present

## 2018-09-22 DIAGNOSIS — K529 Noninfective gastroenteritis and colitis, unspecified: Secondary | ICD-10-CM

## 2018-09-22 DIAGNOSIS — R197 Diarrhea, unspecified: Secondary | ICD-10-CM | POA: Diagnosis not present

## 2018-09-22 DIAGNOSIS — I129 Hypertensive chronic kidney disease with stage 1 through stage 4 chronic kidney disease, or unspecified chronic kidney disease: Secondary | ICD-10-CM | POA: Diagnosis not present

## 2018-09-22 DIAGNOSIS — K3184 Gastroparesis: Secondary | ICD-10-CM

## 2018-09-22 DIAGNOSIS — N183 Chronic kidney disease, stage 3 (moderate): Secondary | ICD-10-CM | POA: Diagnosis not present

## 2018-09-22 MED ORDER — SODIUM CHLORIDE 0.9 % IV SOLN: 500.0000 mL | Freq: Once | INTRAVENOUS | Status: DC

## 2018-09-22 NOTE — Progress Notes (Signed)
Called to room to assist during endoscopic procedure.  Patient ID and intended procedure confirmed with present staff. Received instructions for my participation in the procedure from the performing physician.  

## 2018-09-22 NOTE — Progress Notes (Signed)
Pt's states no medical or surgical changes since previsit or office visit.  June Bullock took temp and Nancy Campbell took vitals. 

## 2018-09-22 NOTE — Op Note (Signed)
[facility] Patient Name: Duane [name] Procedure Date: 09/22/2018 2:42 PM MRN: [id number] Endoscopist: [name] , MD Age: 43 Referring MD:  Date of Birth: Oct 06, 1975 Gender: Male Account #: [id number] Procedure:                Upper GI endoscopy Indications:              Gastroparesis, Follow-up of gastroparesis Medicines:                Propofol per Anesthesia, Monitored Anesthesia Care Procedure:                Pre-Anesthesia Assessment:                           - Prior to the procedure, a History and Physical                            was performed, and patient medications and                            allergies were reviewed. The patient's tolerance of                            previous anesthesia was also reviewed. The risks                            and benefits of the procedure and the sedation                            options and risks were discussed with the patient.                            All questions were answered, and informed consent                            was obtained. Prior Anticoagulants: The patient has                            taken no previous anticoagulant or antiplatelet                            agents. ASA Grade Assessment: III - A patient with                            severe systemic disease. After reviewing the risks                            and benefits, the patient was deemed in                            satisfactory condition to undergo the procedure.                           After obtaining informed consent, the endoscope was  passed under direct vision. Throughout the                            procedure, the patient's blood pressure, pulse, and                            oxygen saturations were monitored continuously. The                            Endoscope was introduced through the mouth, and                            advanced to the second part of duodenum. The upper              GI endoscopy was accomplished without difficulty.                            The patient tolerated the procedure well. Scope In: Scope Out: Findings:                 The esophagus was normal.                           The stomach was normal.                           The examined duodenum was normal.                           The cardia and gastric fundus were normal on                            retroflexion. Complications:            No immediate complications. Estimated Blood Loss:     Estimated blood loss: none. Impression:               - Normal esophagus.                           - Normal stomach.                           - Normal examined duodenum.                           - No specimens collected. Recommendation:           - Patient has a contact number available for                            emergencies. The signs and symptoms of potential                            delayed complications were discussed with the                            patient. Return to normal activities tomorrow.  Written discharge instructions were provided to the                            patient.                           - Resume previous diet.                           - Continue present medications.                           - See the other procedure note for documentation of                            additional recommendations. [name], MD 09/22/2018 3:12:45 PM This report has been signed electronically.

## 2018-09-22 NOTE — Patient Instructions (Addendum)
Other than a tiny colon polyp (removed) everything looks ok. I did take colon biopsies to see if there is microscopic inflammation.  Once I get the biopsy results will let you know that and plans.  Hang in there!  Keep up the good work with the diabetes.  I appreciate the opportunity to care for you. [name], MD, FACG YOU HAD AN ENDOSCOPIC PROCEDURE TODAY AT [facility]:   Refer to the procedure report that was given to you for any specific questions about what was found during the examination.  If the procedure report does not answer your questions, please call your gastroenterologist to clarify.  If you requested that your care partner not be given the details of your procedure findings, then the procedure report has been included in a sealed envelope for you to review at your convenience later.  YOU SHOULD EXPECT: Some feelings of bloating in the abdomen. Passage of more gas than usual.  Walking can help get rid of the air that was put into your GI tract during the procedure and reduce the bloating. If you had a lower endoscopy (such as a colonoscopy or flexible sigmoidoscopy) you may notice spotting of blood in your stool or on the toilet paper. If you underwent a bowel prep for your procedure, you may not have a normal bowel movement for a few days.  Please Note:  You might notice some irritation and congestion in your nose or some drainage.  This is from the oxygen used during your procedure.  There is no need for concern and it should clear up in a day or so.  SYMPTOMS TO REPORT IMMEDIATELY:   Following lower endoscopy (colonoscopy or flexible sigmoidoscopy):  Excessive amounts of blood in the stool  Significant tenderness or worsening of abdominal pains  Swelling of the abdomen that is new, acute  Fever of 100F or higher   Following upper endoscopy (EGD)  Vomiting of blood or coffee ground material  New chest pain or pain under the shoulder  blades  Painful or persistently difficult swallowing  New shortness of breath  Fever of 100F or higher  Black, tarry-looking stools  For urgent or emergent issues, a gastroenterologist can be reached at any hour by calling [phone].   DIET:  We do recommend a small meal at first, but then you may proceed to your regular diet.  Drink plenty of fluids but you should avoid alcoholic beverages for 24 hours.  MEDICATIONS: Continue present medications.  Please see handouts given to you by your recovery nurse.  ACTIVITY:  You should plan to take it easy for the rest of today and you should NOT DRIVE or use heavy machinery until tomorrow (because of the sedation medicines used during the test).    FOLLOW UP: Our staff will call the number listed on your records 48-72 hours following your procedure to check on you and address any questions or concerns that you may have regarding the information given to you following your procedure. If we do not reach you, we will leave a message.  We will attempt to reach you two times.  During this call, we will ask if you have developed any symptoms of COVID 19. If you develop any symptoms (ie: fever, flu-like symptoms, shortness of breath, cough etc.) before then, please call [phone].  If you test positive for Covid 19 in the 2 weeks post procedure, please call and report this information to [name].  If any biopsies were taken you will be contacted by phone or by letter within the next 1-3 weeks.  Please call us at [phone] if you have not heard about the biopsies in 3 weeks.   Thank you for allowing [name] to provide for your healthcare needs today.   SIGNATURES/CONFIDENTIALITY: You and/or your care partner have signed paperwork which will be entered into your electronic medical record.  These signatures attest to the fact that that the information above on your After Visit Summary has been reviewed and is understood.  Full responsibility of the  confidentiality of this discharge information lies with you and/or your care-partner.

## 2018-09-22 NOTE — Progress Notes (Signed)
Report to PACU, RN, vss, BBS= Clear.  

## 2018-09-22 NOTE — Op Note (Signed)
[facility] Patient Name: Duane [name] Procedure Date: 09/22/2018 2:42 PM MRN: [id number] Endoscopist: [name] , MD Age: 43 Referring MD:  Date of Birth: August 14, 1975 Gender: Male Account #: [id number] Procedure:                Colonoscopy Indications:              Chronic diarrhea Medicines:                Propofol per Anesthesia, Monitored Anesthesia Care Procedure:                Pre-Anesthesia Assessment:                           - Prior to the procedure, a History and Physical                            was performed, and patient medications and                            allergies were reviewed. The patient's tolerance of                            previous anesthesia was also reviewed. The risks                            and benefits of the procedure and the sedation                            options and risks were discussed with the patient.                            All questions were answered, and informed consent                            was obtained. Prior Anticoagulants: The patient has                            taken no previous anticoagulant or antiplatelet                            agents. ASA Grade Assessment: III - A patient with                            severe systemic disease. After reviewing the risks                            and benefits, the patient was deemed in                            satisfactory condition to undergo the procedure.                           After obtaining informed consent, the colonoscope  was passed under direct vision. Throughout the                            procedure, the patient's blood pressure, pulse, and                            oxygen saturations were monitored continuously. The                            Colonoscope was introduced through the anus and                            advanced to the the terminal ileum, with                            identification of the  appendiceal orifice and IC                            valve. The colonoscopy was performed without                            difficulty. The patient tolerated the procedure                            well. The quality of the bowel preparation was                            good. The terminal ileum, ileocecal valve,                            appendiceal orifice, and rectum were photographed.                            The bowel preparation used was SUPREP via split                            dose instruction. Scope In: 2:49:39 PM Scope Out: 3:04:01 PM Scope Withdrawal Time: 0 hours 9 minutes 50 seconds  Total Procedure Duration: 0 hours 14 minutes 22 seconds  Findings:                 The perianal and digital rectal examinations were                            normal. Pertinent negatives include normal prostate                            (size, shape, and consistency).                           The terminal ileum appeared normal.                           A 1 to 2 mm polyp was found in the transverse  colon. The polyp was sessile. The polyp was removed                            with a cold biopsy forceps. Resection and retrieval                            were complete. Verification of patient                            identification for the specimen was done. Estimated                            blood loss was minimal.                           The exam was otherwise without abnormality on                            direct and retroflexion views.                           Biopsies for histology were taken with a cold                            forceps from the right colon and left colon for                            evaluation of microscopic colitis. Complications:            No immediate complications. Estimated Blood Loss:     Estimated blood loss was minimal. Impression:               - The examined portion of the ileum was normal.                            - One 1 to 2 mm polyp in the transverse colon,                            removed with a cold biopsy forceps. Resected and                            retrieved.                           - The examination was otherwise normal on direct                            and retroflexion views.                           - Biopsies were taken with a cold forceps from the                            right colon and left colon for evaluation of  microscopic colitis. Recommendation:           - Patient has a contact number available for                            emergencies. The signs and symptoms of potential                            delayed complications were discussed with the                            patient. Return to normal activities tomorrow.                            Written discharge instructions were provided to the                            patient.                           - Resume previous diet.                           - Continue present medications.                           - Repeat colonoscopy is recommended. The                            colonoscopy date will be determined after pathology                            results from today's exam become available for                            review. [name], MD 09/22/2018 3:15:40 PM This report has been signed electronically.

## 2018-09-23 ENCOUNTER — Ambulatory Visit: Payer: Medicare Other | Admitting: Family Medicine

## 2018-09-24 ENCOUNTER — Telehealth: Payer: Self-pay | Admitting: *Deleted

## 2018-09-24 NOTE — Telephone Encounter (Signed)
  Follow up Call-  Call back number 09/22/2018  Post procedure Call Back phone  # [phone]  Permission to leave phone message Yes  Some recent data might be hidden     Patient questions:  Do you have a fever, pain , or abdominal swelling? No. Pain Score  0 *  Have you tolerated food without any problems? Yes.    Have you been able to return to your normal activities? Yes.    Do you have any questions about your discharge instructions: Diet   No. Medications  No. Follow up visit  No.  Do you have questions or concerns about your Care? No.  Actions: * If pain score is 4 or above: No action needed, pain <4.  1. Have you developed a fever since your procedure? no  2.   Have you had an respiratory symptoms (SOB or cough) since your procedure? no  3.   Have you tested positive for COVID 19 since your procedure no  4.   Have you had any family members/close contacts diagnosed with the COVID 19 since your procedure?  no   If yes to any of these questions please route to Joylene John, RN and Alphonsa Gin, Therapist, sports.

## 2018-10-05 ENCOUNTER — Encounter: Payer: Self-pay | Admitting: Internal Medicine

## 2018-10-05 DIAGNOSIS — Z860101 Personal history of adenomatous and serrated colon polyps: Secondary | ICD-10-CM

## 2018-10-05 DIAGNOSIS — Z86010 Personal history of colonic polyps: Secondary | ICD-10-CM

## 2018-10-05 HISTORY — DX: Personal history of colonic polyps: Z86.010

## 2018-10-05 HISTORY — DX: Personal history of adenomatous and serrated colon polyps: Z86.0101

## 2018-10-05 NOTE — Progress Notes (Signed)
[facility] colon recall 09/2025 No letter  Office  Tell him one benign polyp and normal colon lining  Rx Xifaxan 550 mg tid x 14 days # 42 no refill for IBS -D  He has failed cholestyramine and dicyclomine and has used Xifaxan before though may have been samples -   Needs Office visit 1 month

## 2018-10-06 ENCOUNTER — Other Ambulatory Visit: Payer: Self-pay

## 2018-10-06 DIAGNOSIS — K580 Irritable bowel syndrome with diarrhea: Secondary | ICD-10-CM

## 2018-10-06 MED ORDER — RIFAXIMIN 550 MG PO TABS: 550.0000 mg | ORAL_TABLET | Freq: Three times a day (TID) | ORAL | 0 refills | Status: DC

## 2018-10-06 MED FILL — !XIFAXAN 550 MG TABLET: 550 | 4 days supply | Qty: 12 | Fill #0

## 2018-10-07 ENCOUNTER — Other Ambulatory Visit: Payer: Self-pay | Admitting: Family Medicine

## 2018-10-07 DIAGNOSIS — E1042 Type 1 diabetes mellitus with diabetic polyneuropathy: Secondary | ICD-10-CM

## 2018-10-07 MED FILL — DILTIAZEM 24HR ER 180 MG CA: 180 | 30 days supply | Qty: 30 | Fill #2

## 2018-10-07 MED FILL — ATORVASTATIN 20 MG TABLET: 20 | 30 days supply | Qty: 30 | Fill #0

## 2018-10-07 MED FILL — DULoxetine HCL 60 MG CPEP: 60 | 30 days supply | Qty: 30 | Fill #3

## 2018-10-07 MED FILL — XIFAXAN 550 MG TABLET: 550 | 10 days supply | Qty: 30 | Fill #1

## 2018-10-08 ENCOUNTER — Telehealth: Payer: Self-pay

## 2018-10-08 NOTE — Telephone Encounter (Signed)
was prescribed xifaxan 550mg  from dr. Carlean Purl and we gave him 4 days worth of samples.  can we get dr. Margarita Rana to send in an rx for #30 tablets 1 tid for the remaining balance that we owe him?  he is signing up for pt assistance.

## 2018-10-08 NOTE — Telephone Encounter (Signed)
Prescription was written by GI on 10/06/18 #42 with 0 refills (he should have meds in the pharmacy per this order).  As per GI notes this is a trial.  He will need to get in touch with GI to determine if they need to keep him on it and provide additional refills if needed.

## 2018-10-10 ENCOUNTER — Telehealth: Payer: Self-pay | Admitting: Family Medicine

## 2018-10-10 DIAGNOSIS — K580 Irritable bowel syndrome with diarrhea: Secondary | ICD-10-CM

## 2018-10-10 MED ORDER — RIFAXIMIN 550 MG PO TABS: 550.0000 mg | ORAL_TABLET | Freq: Three times a day (TID) | ORAL | 0 refills | Status: DC

## 2018-10-10 MED FILL — XIFAXAN 550 MG TABLET: 550 | 30 days supply | Qty: 30 | Fill #0

## 2018-10-10 NOTE — Telephone Encounter (Signed)
Rx for Rifaximin written by GI- [name]. I had to rewrite script so this can be filled at a subsidized rate in the Pharmacy in house.

## 2018-10-13 MED FILL — LISINOPRIL 40 MG TABLET: 40 | 30 days supply | Qty: 30 | Fill #1

## 2018-10-13 MED FILL — METOCLOPRAMIDE 10 MG TABLET: 10 | 20 days supply | Qty: 60 | Fill #3

## 2018-10-21 ENCOUNTER — Other Ambulatory Visit: Payer: Self-pay

## 2018-10-21 ENCOUNTER — Ambulatory Visit: Payer: Medicare Other | Attending: Family Medicine | Admitting: Family Medicine

## 2018-10-21 ENCOUNTER — Encounter: Payer: Self-pay | Admitting: Family Medicine

## 2018-10-21 VITALS — BP 131/83 | HR 96 | Temp 98.5°F | Ht 76.0 in | Wt 340.6 lb

## 2018-10-21 DIAGNOSIS — E1042 Type 1 diabetes mellitus with diabetic polyneuropathy: Secondary | ICD-10-CM | POA: Diagnosis not present

## 2018-10-21 DIAGNOSIS — E1043 Type 1 diabetes mellitus with diabetic autonomic (poly)neuropathy: Secondary | ICD-10-CM | POA: Diagnosis not present

## 2018-10-21 DIAGNOSIS — N183 Chronic kidney disease, stage 3 (moderate): Secondary | ICD-10-CM | POA: Insufficient documentation

## 2018-10-21 DIAGNOSIS — E785 Hyperlipidemia, unspecified: Secondary | ICD-10-CM | POA: Diagnosis not present

## 2018-10-21 DIAGNOSIS — E1022 Type 1 diabetes mellitus with diabetic chronic kidney disease: Secondary | ICD-10-CM | POA: Insufficient documentation

## 2018-10-21 DIAGNOSIS — Z8719 Personal history of other diseases of the digestive system: Secondary | ICD-10-CM | POA: Diagnosis not present

## 2018-10-21 DIAGNOSIS — Z86010 Personal history of colonic polyps: Secondary | ICD-10-CM | POA: Diagnosis not present

## 2018-10-21 DIAGNOSIS — E669 Obesity, unspecified: Secondary | ICD-10-CM | POA: Insufficient documentation

## 2018-10-21 DIAGNOSIS — R600 Localized edema: Secondary | ICD-10-CM | POA: Diagnosis not present

## 2018-10-21 DIAGNOSIS — Z794 Long term (current) use of insulin: Secondary | ICD-10-CM | POA: Insufficient documentation

## 2018-10-21 DIAGNOSIS — Z833 Family history of diabetes mellitus: Secondary | ICD-10-CM | POA: Insufficient documentation

## 2018-10-21 DIAGNOSIS — F321 Major depressive disorder, single episode, moderate: Secondary | ICD-10-CM | POA: Diagnosis not present

## 2018-10-21 DIAGNOSIS — K219 Gastro-esophageal reflux disease without esophagitis: Secondary | ICD-10-CM | POA: Diagnosis not present

## 2018-10-21 DIAGNOSIS — I129 Hypertensive chronic kidney disease with stage 1 through stage 4 chronic kidney disease, or unspecified chronic kidney disease: Secondary | ICD-10-CM | POA: Diagnosis not present

## 2018-10-21 DIAGNOSIS — K3184 Gastroparesis: Secondary | ICD-10-CM | POA: Insufficient documentation

## 2018-10-21 DIAGNOSIS — E0843 Diabetes mellitus due to underlying condition with diabetic autonomic (poly)neuropathy: Secondary | ICD-10-CM

## 2018-10-21 DIAGNOSIS — M1990 Unspecified osteoarthritis, unspecified site: Secondary | ICD-10-CM | POA: Insufficient documentation

## 2018-10-21 DIAGNOSIS — Z79899 Other long term (current) drug therapy: Secondary | ICD-10-CM | POA: Diagnosis not present

## 2018-10-21 DIAGNOSIS — K529 Noninfective gastroenteritis and colitis, unspecified: Secondary | ICD-10-CM | POA: Diagnosis not present

## 2018-10-21 DIAGNOSIS — Z6841 Body Mass Index (BMI) 40.0 and over, adult: Secondary | ICD-10-CM | POA: Diagnosis not present

## 2018-10-21 DIAGNOSIS — I10 Essential (primary) hypertension: Secondary | ICD-10-CM

## 2018-10-21 DIAGNOSIS — R55 Syncope and collapse: Secondary | ICD-10-CM | POA: Diagnosis not present

## 2018-10-21 LAB — POCT GLYCOSYLATED HEMOGLOBIN (HGB A1C): HbA1c, POC (controlled diabetic range): 9.3 % — AB (ref 0.0–7.0)

## 2018-10-21 LAB — GLUCOSE, POCT (MANUAL RESULT ENTRY): POC Glucose: 180 mg/dl — AB (ref 70–99)

## 2018-10-21 MED ORDER — LANTUS SOLOSTAR 100 UNIT/ML ~~LOC~~ SOPN: 40.0000 [IU] | PEN_INJECTOR | Freq: Two times a day (BID) | SUBCUTANEOUS | 3 refills | Status: AC

## 2018-10-21 MED ORDER — DULOXETINE HCL 60 MG PO CPEP: 60.0000 mg | ORAL_CAPSULE | Freq: Every day | ORAL | 3 refills | Status: DC

## 2018-10-21 MED ORDER — INSULIN LISPRO (1 UNIT DIAL) 100 UNIT/ML (KWIKPEN): PEN_INJECTOR | SUBCUTANEOUS | 2 refills | Status: DC

## 2018-10-21 MED ORDER — DILTIAZEM HCL ER COATED BEADS 180 MG PO CP24: 180.0000 mg | ORAL_CAPSULE | Freq: Every day | ORAL | 3 refills | Status: DC

## 2018-10-21 MED ORDER — LISINOPRIL 40 MG PO TABS: 40.0000 mg | ORAL_TABLET | Freq: Every day | ORAL | 0 refills | Status: DC

## 2018-10-21 MED ORDER — GABAPENTIN 300 MG PO CAPS: 300.0000 mg | ORAL_CAPSULE | Freq: Every day | ORAL | 6 refills | Status: DC

## 2018-10-21 MED ORDER — ATORVASTATIN CALCIUM 20 MG PO TABS: 20.0000 mg | ORAL_TABLET | Freq: Every day | ORAL | 2 refills | Status: AC

## 2018-10-21 NOTE — Progress Notes (Signed)
Patient has been having dizzy spells  Patient states that he has blacked out at work and home.

## 2018-10-21 NOTE — Patient Instructions (Signed)

## 2018-10-21 NOTE — Progress Notes (Signed)
Subjective:  Patient ID: Duane [name], male    DOB: [birth year]  Age: 43 y.o. MRN: [id number]  CC: Diabetes   HPI Duane [name] is a 43 year old male with history of type 1 diabetes mellitus (A1c 9.3), diabetic neuropathy, diabetic gastroparesis hypertension, stage III chronic kidney disease, depression who presents today for a follow up visit. He has lost 13 pounds and is working on losing more weight, his pedal edema has improved and he is compliant with his visits to his nephrologist at [facility]. He is yet to see his endocrinologist for management of this diabetes mellitus and his A1c is 9.3 which is up from 8.5 previously.  He has had a few episodes of hypoglycemia but is unable to give me exact numbers. His chronic diarrhea has improved on Xifaxan and he is closely followed by his gastroenterologist-Dr. Carlean Purl.  He complains of a 34-monthhistory of intermittent dizziness which he describes as blacking out but he never actually had a syncopal episode.  He has noticed this with exertion or heavy lifting one of which occurred at work.  On another occasion he had just returned from work, was sweating a lot and was in the process of opening up the refrigerator.  He denied the room spinning, denied headache, denied chest pain but did have some dyspnea.  Symptoms resolved after he sat down for about 2 to 3 minutes. Denies history of seizures.  Past Medical History:  Diagnosis Date  . Allergy   . Arthritis   . Chronic diarrhea   . Chronic kidney disease   . Depression   . Diabetes mellitus without complication ([facility] age 43  insulin requiring from the start  . [place] [address]  . Diabetic proliferative retinopathy ([facility] 08/02/2017  . Gastritis   . Gastroparesis 2012   100% gastric retention on 08/2013 GES  . GERD (gastroesophageal reflux disease) Dx 2012  . Hx of adenomatous polyp of colon 10/05/2018  . Hyperlipidemia   . Hypertension Dx 2012  .  Neuromuscular disorder ([facility]   . Obesity     Past Surgical History:  Procedure Laterality Date  . CHOLECYSTECTOMY  2013  . ESOPHAGOGASTRODUODENOSCOPY N/A 06/11/2013   Procedure: ESOPHAGOGASTRODUODENOSCOPY (EGD);  Surgeon: CGatha Mayer MD;  [facility];  Service: Endoscopy;  Laterality: N/A;  . EYE SURGERY    . FLEXIBLE SIGMOIDOSCOPY N/A 06/11/2013   Procedure: FLEXIBLE SIGMOIDOSCOPY;  Surgeon: CGatha Mayer MD;  Location[facility];  Service: Endoscopy;  Laterality: N/A;  . KNEE SURGERY Left     Family History  Problem Relation Age of Onset  . Diabetes Father   . Diabetes Maternal Grandmother   . Diabetes Paternal Grandmother   . Colon cancer Cousin   . Stomach cancer Neg Hx   . Rectal cancer Neg Hx   . Esophageal cancer Neg Hx     No Known Allergies  Outpatient Medications Prior to Visit  Medication Sig Dispense Refill  . Blood Glucose Monitoring Suppl (TRUE METRIX METER) W/DEVICE KIT 1 each by Does not apply route 4 (four) times daily -  before meals and at bedtime. 1 kit ea  . furosemide (LASIX) 40 MG tablet Take 1.5 tablets (60 mg total) by mouth 2 (two) times daily. 90 tablet 3  . glucose blood (CONTOUR NEXT TEST) test strip Use as instructed 100 each 12  . glucose blood (CONTOUR NEXT TEST) test strip Use as instructed 100 each 12  . glucose blood (TRUE METRIX BLOOD  GLUCOSE TEST) test strip 1 each by Other route 3 (three) times daily. 100 each 11  . hyoscyamine (LEVSIN) 0.125 MG tablet TAKE 1 TABLET (0.125 MG TOTAL) BY MOUTH 3 (THREE) TIMES DAILY WITH MEALS AS NEEDED. 90 tablet 2  . Insulin Pen Needle 31G X 8 MM MISC Inject 1 each into the skin 4 (four) times daily. Check blood sugar TID and QHS 120 each 11  . metoCLOPramide (REGLAN) 10 MG tablet TAKE 1 TABLET 3 TIMES DAILY BEFORE MEALS AS NEEDED FOR NAUSEA 60 tablet 3  . Pancrelipase, Lip-Prot-Amyl, (ZENPEP) 40000-126000 units CPEP Take 2 capsules by mouth 3 (three) times daily with meals. Take during meal  180 capsule 11  . pantoprazole (PROTONIX) 40 MG tablet Take 1 tablet (40 mg total) by mouth 2 (two) times daily. 60 tablet 3  . Peppermint Oil (IBGARD) 90 MG CPCR Take 2 capsules by mouth 3 (three) times daily as needed. (Patient taking differently: Take 1 capsule by mouth 3 (three) times daily. ) 16 capsule 0  . potassium chloride (MICRO-K) 10 MEQ CR capsule Take 1 capsule (10 mEq total) by mouth daily. 30 capsule 3  . promethazine (PHENERGAN) 25 MG tablet TAKE 1 TABLET BY MOUTH EVERY 8 HOURS AS NEEDED FOR NAUSEA OR VOMITING. (Patient taking differently: Take 25 mg by mouth every 6 (six) hours as needed for nausea or vomiting. ) 60 tablet 0  . rifaximin (XIFAXAN) 550 MG TABS tablet Take 1 tablet (550 mg total) by mouth 3 (three) times daily. 30 tablet 0  . sucralfate (CARAFATE) 1 g tablet TAKE 1 TABLET (1 G TOTAL) BY MOUTH 4 (FOUR) TIMES DAILY. 120 tablet 1  . traMADol (ULTRAM) 50 MG tablet Take 1 tablet (50 mg total) by mouth every 6 (six) hours as needed. (Patient taking differently: Take 50 mg by mouth every 6 (six) hours as needed for moderate pain. ) 30 tablet 0  . TRUEPLUS LANCETS 28G MISC 1 each by Does not apply route 4 (four) times daily -  before meals and at bedtime. 100 each 12  . atorvastatin (LIPITOR) 20 MG tablet TAKE 1 TABLET (20 MG TOTAL) BY MOUTH DAILY. 30 tablet 2  . CARTIA XT 180 MG 24 hr capsule TAKE 1 CAPSULE (180 MG TOTAL) BY MOUTH DAILY. 30 capsule 2  . DULoxetine (CYMBALTA) 60 MG capsule Take 1 capsule (60 mg total) by mouth daily. 30 capsule 3  . gabapentin (NEURONTIN) 300 MG capsule Take 1 capsule (300 mg total) by mouth at bedtime. 30 capsule 6  . HUMALOG KWIKPEN 100 UNIT/ML KwikPen INJECT 15 UNITS INTO THE SKIN 3 TIMES DAILY. 15 mL 2  . Insulin Glargine (LANTUS SOLOSTAR) 100 UNIT/ML Solostar Pen Inject 40 Units into the skin 2 (two) times daily. 30 mL 6  . lisinopril (ZESTRIL) 40 MG tablet TAKE 1 TABLET (40 MG TOTAL) BY MOUTH DAILY. 90 tablet 0   Facility-Administered  Medications Prior to Visit  Medication Dose Route Frequency Provider Last Rate Last Dose  . 0.9 %  sodium chloride infusion  500 mL Intravenous Once [name], MD         ROS Review of Systems  Constitutional: Negative for activity change and appetite change.  HENT: Negative for sinus pressure and sore throat.   Eyes: Negative for visual disturbance.  Respiratory: Negative for cough, chest tightness and shortness of breath.   Cardiovascular: Negative for chest pain and leg swelling.  Gastrointestinal: Positive for diarrhea. Negative for abdominal distention, abdominal pain and constipation.  Endocrine: Negative.   Genitourinary: Negative for dysuria.  Musculoskeletal: Negative for joint swelling and myalgias.  Skin: Negative for rash.  Allergic/Immunologic: Negative.   Neurological: Positive for light-headedness. Negative for weakness and numbness.  Psychiatric/Behavioral: Negative for dysphoric mood and suicidal ideas.    Objective:  BP 131/83   Pulse 96   Temp 98.5 F (36.9 C) (Oral)   Ht _0  (1.93 m)   Wt (!) 340 lb 9.6 oz (154.5 kg)   SpO2 99%   BMI 41.46 kg/m   BP/Weight 10/21/2018 05/03/2246 03/20/35  Systolic BP 048 889 169  Diastolic BP 83 86 90  Wt. (Lbs) 340.6 340 340.8  BMI 41.46 41.39 41.48      Physical Exam Constitutional:      Appearance: He is well-developed.  Cardiovascular:     Rate and Rhythm: Normal rate.     Heart sounds: Normal heart sounds. No murmur.  Pulmonary:     Effort: Pulmonary effort is normal.     Breath sounds: Normal breath sounds. No wheezing or rales.  Chest:     Chest wall: No tenderness.  Abdominal:     General: Bowel sounds are normal. There is no distension.     Palpations: Abdomen is soft. There is no mass.     Tenderness: There is no abdominal tenderness.  Musculoskeletal: Normal range of motion.  Neurological:     Mental Status: He is alert and oriented to person, place, and time.  Psychiatric:        Mood  and Affect: Mood normal.        Behavior: Behavior normal.     CMP Latest Ref Rng & Units 06/09/2018 04/01/2018 02/20/2018  Glucose 70 - 99 mg/dL 158(H) 120(H) 169(H)  BUN 6 - 23 mg/dL _1 Creatinine 0.40 - 1.50 mg/dL 1.72(H) 1.49 1.73(H)  Sodium 135 - 145 mEq/L 139 139 138  Potassium 3.5 - 5.1 mEq/L 3.8 3.8 4.5  Chloride 96 - 112 mEq/L 107 108 107  CO2 19 - 32 mEq/L _2 Calcium 8.4 - 10.5 mg/dL 8.1(L) 8.6 9.2  Total Protein 6.0 - 8.3 g/dL - 6.4 -  Total Bilirubin 0.2 - 1.2 mg/dL - 0.3 -  Alkaline Phos 39 - 117 U/L - 89 -  AST 0 - 37 U/L - 18 -  ALT 0 - 53 U/L - 20 -    Lipid Panel     Component Value Date/Time   CHOL 174 06/09/2018 1019   CHOL 138 05/17/2017 1026   TRIG 129.0 06/09/2018 1019   HDL 34.60 (L) 06/09/2018 1019   HDL 29 (L) 05/17/2017 1026   CHOLHDL 5 06/09/2018 1019   VLDL 25.8 06/09/2018 1019   LDLCALC 114 (H) 06/09/2018 1019   LDLCALC 65 05/17/2017 1026    CBC    Component Value Date/Time   WBC 7.3 12/24/2017 0752   RBC 4.06 (L) 12/24/2017 0752   HGB 10.8 (L) 12/24/2017 0752   HCT 34.7 (L) 12/24/2017 0752   PLT 318 12/24/2017 0752   MCV 85.5 12/24/2017 0752   MCH 26.6 12/24/2017 0752   MCHC 31.1 12/24/2017 0752   RDW 12.6 12/24/2017 0752   LYMPHSABS 2.3 12/24/2017 0752   MONOABS 0.6 12/24/2017 0752   EOSABS 0.3 12/24/2017 0752   BASOSABS 0.1 12/24/2017 0752    Lab Results  Component Value Date   HGBA1C 9.3 (A) 10/21/2018    Assessment & Plan:   1. Type 1 diabetes mellitus with diabetic  polyneuropathy ([place]) Uncontrolled with A1c of 9.3 Management as per endocrinologist-Dr. Dwyane Dee - POCT glucose (manual entry) - POCT glycosylated hemoglobin (Hb A1C) - insulin lispro (HUMALOG KWIKPEN) 100 UNIT/ML KwikPen; INJECT 15 UNITS INTO THE SKIN 3 TIMES DAILY.  Dispense: 15 mL; Refill: 2 - atorvastatin (LIPITOR) 20 MG tablet; Take 1 tablet (20 mg total) by mouth daily.  Dispense: 30 tablet; Refill: 2 - Insulin Glargine (LANTUS SOLOSTAR) 100  UNIT/ML Solostar Pen; Inject 40 Units into the skin 2 (two) times daily.  Dispense: 30 mL; Refill: 3  2. Essential hypertension Controlled - diltiazem (CARTIA XT) 180 MG 24 hr capsule; Take 1 capsule (180 mg total) by mouth daily.  Dispense: 30 capsule; Refill: 3  3. Diabetic autonomic neuropathy associated with diabetes mellitus due to underlying condition (HCC) Stable - gabapentin (NEURONTIN) 300 MG capsule; Take 1 capsule (300 mg total) by mouth at bedtime.  Dispense: 30 capsule; Refill: 6 - DULoxetine (CYMBALTA) 60 MG capsule; Take 1 capsule (60 mg total) by mouth daily.  Dispense: 30 capsule; Refill: 3  4. Syncope, near EKG unrevealing Heat exhaution a possibility He could also have abnormal neurohormonal mechanism given his history of diabetes We will also need to evaluate cardiac function - ECHOCARDIOGRAM COMPLETE; Future  5. Chronic diarrhea Improving on Xifaxan  6. Current moderate episode of major depressive disorder without prior episode ([place]) Uncontrolled despite remaining on Cymbalta Referred to LCSW for counseling - Ambulatory referral to Social Work   Health Care Maintenance: Defers his pneumonia and flu shots to next visit Meds ordered this encounter  Medications  . insulin lispro (HUMALOG KWIKPEN) 100 UNIT/ML KwikPen    Sig: INJECT 15 UNITS INTO THE SKIN 3 TIMES DAILY.    Dispense:  15 mL    Refill:  2  . diltiazem (CARTIA XT) 180 MG 24 hr capsule    Sig: Take 1 capsule (180 mg total) by mouth daily.    Dispense:  30 capsule    Refill:  3  . atorvastatin (LIPITOR) 20 MG tablet    Sig: Take 1 tablet (20 mg total) by mouth daily.    Dispense:  30 tablet    Refill:  2  . gabapentin (NEURONTIN) 300 MG capsule    Sig: Take 1 capsule (300 mg total) by mouth at bedtime.    Dispense:  30 capsule    Refill:  6  . lisinopril (ZESTRIL) 40 MG tablet    Sig: Take 1 tablet (40 mg total) by mouth daily.    Dispense:  90 tablet    Refill:  0  . Insulin Glargine  (LANTUS SOLOSTAR) 100 UNIT/ML Solostar Pen    Sig: Inject 40 Units into the skin 2 (two) times daily.    Dispense:  30 mL    Refill:  3  . DULoxetine (CYMBALTA) 60 MG capsule    Sig: Take 1 capsule (60 mg total) by mouth daily.    Dispense:  30 capsule    Refill:  3    Follow-up: Return in about 3 months (around 01/20/2019) for follow up on dizziness.       Charlott Rakes, MD, FAAFP. [facility] [place], [place]   10/21/2018, 5:08 PM

## 2018-10-22 MED FILL — ?HUMALOG 100 UNITS/ML KWIKP: 100 | 33 days supply | Qty: 15 | Fill #0

## 2018-10-22 MED FILL — $LANTUS SOLOSTAR 100 UNITS/: 100 | 33 days supply | Qty: 27 | Fill #0

## 2018-10-22 MED FILL — GABAPENTIN 300 MG CAPSULE: 300 | 30 days supply | Qty: 30 | Fill #0

## 2018-10-27 ENCOUNTER — Other Ambulatory Visit: Payer: Self-pay

## 2018-10-27 ENCOUNTER — Ambulatory Visit (HOSPITAL_COMMUNITY)
Admission: RE | Admit: 2018-10-27 | Discharge: 2018-10-27 | Disposition: A | Discharge status: Home / Self Care | Payer: Medicare Other | Source: Ambulatory Visit | Attending: Family Medicine | Admitting: Family Medicine

## 2018-10-27 DIAGNOSIS — I1310 Hypertensive heart and chronic kidney disease without heart failure, with stage 1 through stage 4 chronic kidney disease, or unspecified chronic kidney disease: Secondary | ICD-10-CM | POA: Diagnosis not present

## 2018-10-27 DIAGNOSIS — R55 Syncope and collapse: Secondary | ICD-10-CM | POA: Diagnosis not present

## 2018-10-27 DIAGNOSIS — E1122 Type 2 diabetes mellitus with diabetic chronic kidney disease: Secondary | ICD-10-CM | POA: Insufficient documentation

## 2018-10-27 DIAGNOSIS — E669 Obesity, unspecified: Secondary | ICD-10-CM | POA: Insufficient documentation

## 2018-10-27 DIAGNOSIS — I080 Rheumatic disorders of both mitral and aortic valves: Secondary | ICD-10-CM | POA: Insufficient documentation

## 2018-10-27 DIAGNOSIS — N189 Chronic kidney disease, unspecified: Secondary | ICD-10-CM | POA: Diagnosis not present

## 2018-10-27 NOTE — Progress Notes (Signed)
  Echocardiogram 2D Echocardiogram has been performed.  Matilde Bash 10/27/2018, 10:45 AM

## 2018-10-28 MED FILL — GLYCOPYRROLATE 1 MG TABLET: 1 | 30 days supply | Qty: 30 | Fill #0

## 2018-11-04 MED FILL — HYOSCYAMINE SULF 0.125 MG T: 0.125 | 30 days supply | Qty: 90 | Fill #2

## 2018-11-04 MED FILL — PANTOPRAZOLE SOD DR 40 MG T: 40 | 30 days supply | Qty: 60 | Fill #2

## 2018-11-06 ENCOUNTER — Other Ambulatory Visit: Payer: Self-pay | Admitting: Family Medicine

## 2018-11-06 DIAGNOSIS — E1043 Type 1 diabetes mellitus with diabetic autonomic (poly)neuropathy: Secondary | ICD-10-CM

## 2018-11-06 MED FILL — ?ATORVASTATIN 20 MG TABLET: 20 | 30 days supply | Qty: 30 | Fill #1

## 2018-11-06 MED FILL — ?SUCRALFATE 1 GM TABS: 1 | 30 days supply | Qty: 120 | Fill #0

## 2018-11-17 ENCOUNTER — Other Ambulatory Visit: Payer: Self-pay

## 2018-11-17 ENCOUNTER — Ambulatory Visit (INDEPENDENT_AMBULATORY_CARE_PROVIDER_SITE_OTHER): Payer: Medicare Other | Admitting: Podiatry

## 2018-11-17 DIAGNOSIS — B351 Tinea unguium: Secondary | ICD-10-CM | POA: Diagnosis not present

## 2018-11-17 DIAGNOSIS — E0843 Diabetes mellitus due to underlying condition with diabetic autonomic (poly)neuropathy: Secondary | ICD-10-CM

## 2018-11-17 DIAGNOSIS — M79676 Pain in unspecified toe(s): Secondary | ICD-10-CM | POA: Diagnosis not present

## 2018-11-17 MED ORDER — TERBINAFINE HCL 250 MG PO TABS: 250.0000 mg | ORAL_TABLET | Freq: Every day | ORAL | 0 refills | Status: AC

## 2018-11-17 MED FILL — TERBINAFINE HCL 250 MG TAB: 250 | 30 days supply | Qty: 30 | Fill #0

## 2018-11-20 ENCOUNTER — Ambulatory Visit: Payer: Medicare Other | Attending: Family Medicine | Admitting: Licensed Clinical Social Worker

## 2018-11-20 DIAGNOSIS — F322 Major depressive disorder, single episode, severe without psychotic features: Secondary | ICD-10-CM

## 2018-11-20 DIAGNOSIS — F411 Generalized anxiety disorder: Secondary | ICD-10-CM

## 2018-11-20 NOTE — Progress Notes (Signed)
   SUBJECTIVE Patient with a history of diabetes mellitus presents to office today complaining of elongated, thickened nails that cause pain while ambulating in shoes. He is unable to trim his own nails. Patient is here for further evaluation and treatment.   Past Medical History:  Diagnosis Date  . Allergy   . Arthritis   . Chronic diarrhea   . Chronic kidney disease   . Depression   . Diabetes mellitus without complication [facility]) age 43   insulin requiring from the start  . Diabetic neuropathy ([place]) Dx 20110  . Diabetic proliferative retinopathy ([place]) 08/02/2017  . Gastritis   . Gastroparesis 2012   100% gastric retention on 08/2013 GES  . GERD (gastroesophageal reflux disease) Dx 2012  . Hx of adenomatous polyp of colon 10/05/2018  . Hyperlipidemia   . Hypertension Dx 2012  . Neuromuscular disorder ([place])   . Obesity     OBJECTIVE General Patient is awake, alert, and oriented x 3 and in no acute distress. Derm Skin is dry and supple bilateral. Negative open lesions or macerations. Remaining integument unremarkable. Nails are tender, long, thickened and dystrophic with subungual debris, consistent with onychomycosis, 1-5 bilateral. No signs of infection noted. Vasc  DP and PT pedal pulses palpable bilaterally. Temperature gradient within normal limits.  Neuro Epicritic and protective threshold sensation diminished bilaterally.  Musculoskeletal Exam No symptomatic pedal deformities noted bilateral. Muscular strength within normal limits.  ASSESSMENT 1. Diabetes Mellitus w/ peripheral neuropathy 2. Onychomycosis of nail due to dermatophyte bilateral 3. Pain in foot bilateral  PLAN OF CARE 1. Patient evaluated today. 2. Instructed to maintain good pedal hygiene and foot care. Stressed importance of controlling blood sugar.  3. Mechanical debridement of nails 1-5 bilaterally performed using a nail nipper. Filed with dremel without incident.  4. Prescription for Lamisil 250 mg #90  provided to patient.  5. Return to clinic in 8 mos. If no improvement in the nails, we will perform nail avulsion procedures.     Edrick Kins, DPM [facility]  Dr. Edrick Kins, [address]                                        [place], [place] [address]                Office [phone]  Fax [phone]

## 2018-11-21 NOTE — BH Specialist Note (Signed)
Integrated Behavioral Health Visit via Telemedicine (Telephone)  11/20/2018 [name] [id number]   Session Start time: 1:40 PM  Session End time: 1:50 PM Total time: 10 min  Referring Provider: Dr. Margarita Rana Type of Visit: Telephonic Patient location: Home [facility] Provider location: Office All persons participating in visit: Pt and LCSW  Confirmed patient's address: No  Confirmed patient's phone number: No  Any changes to demographics: No   Confirmed patient's insurance: Yes  Any changes to patient's insurance: No   Discussed confidentiality: Yes    The following statements were read to the patient and/or legal guardian that are established with the [facility] Provider.  "The purpose of this phone visit is to provide behavioral health care while limiting exposure to the coronavirus (COVID19).  There is a possibility of technology failure and discussed alternative modes of communication if that failure occurs."  "By engaging in this telephone visit, you consent to the provision of healthcare.  Additionally, you authorize for your insurance to be billed for the services provided during this telephone visit."   Patient and/or legal guardian consented to telephone visit: Yes   PRESENTING CONCERNS: Patient and/or family reports the following symptoms/concerns: Pt reports difficulty managing mental health conditions due to chronic medical conditions, loss of loved ones, and stress regarding world events (pandemic, police brutality, etc) Pt endorses racing thoughts and difficulty identifying outlets for stress Duration of problem: Ongoing; Severity of problem: moderate  STRENGTHS (Protective Factors/Coping Skills): Pt has stable housing and insurance Pt is participating in medication management Pt identified children as protective factors  GOALS ADDRESSED: Patient will: 1.  Reduce symptoms of: anxiety, depression and stress  2.  Increase knowledge and/or ability of:  self-management skills  3.  Demonstrate ability to: Increase adequate support systems for patient/family  INTERVENTIONS: Interventions utilized:  Solution-Focused Strategies Standardized Assessments completed: Not Needed  ASSESSMENT: Patient currently experiencing depression and anxiety triggered by chronic medical conditions, loss of loved ones, and stress regarding world events (pandemic, police brutality, etc) Pt endorses racing thoughts and difficulty identifying outlets for stress. Denies SI/HI   Patient may benefit from psychotherapy. LCSW discussed healthy coping skills and strategies to assist in decrease and/or management of symptoms. Pt is agreeable to referral to [facility] to participate in psychotherapy and medication management. LCSW faxed completed referral.   PLAN: 1. Follow up with behavioral health clinician on : Contact LCSW with any additional questions or concerns.  2. Behavioral recommendations: Utilize healthy coping skills discussed in session and follow up with [facility] Outpatient 3. Referral(s): [place] (LME/Outside Clinic)  Rebekah [place], [place] 11/25/2018 4:57 PM

## 2018-11-25 ENCOUNTER — Other Ambulatory Visit: Payer: Self-pay | Admitting: Family Medicine

## 2018-11-25 DIAGNOSIS — K580 Irritable bowel syndrome with diarrhea: Secondary | ICD-10-CM

## 2018-11-25 MED FILL — ?BASAGLAR 100 UNITS/ML KWPE: 100 | 33 days supply | Qty: 27 | Fill #1

## 2018-11-25 MED FILL — TRUE METRIX TEST STRIP: 25 days supply | Qty: 100 | Fill #3

## 2018-12-03 DIAGNOSIS — Z23 Encounter for immunization: Secondary | ICD-10-CM | POA: Diagnosis not present

## 2018-12-03 DIAGNOSIS — R61 Generalized hyperhidrosis: Secondary | ICD-10-CM | POA: Diagnosis not present

## 2018-12-04 MED FILL — OXYBUTYNIN 5 MG TABLET: 5 | 30 days supply | Qty: 30 | Fill #0

## 2018-12-10 MED FILL — LISINOPRIL 40 MG TABLET: 40 | 30 days supply | Qty: 30 | Fill #1

## 2018-12-10 MED FILL — ?ATORVASTATIN 20 MG TABLET: 20 | 30 days supply | Qty: 30 | Fill #2

## 2018-12-10 MED FILL — DILTIAZEM 24HR ER 180 MG CA: 180 | 30 days supply | Qty: 30 | Fill #1

## 2018-12-10 MED FILL — METOCLOPRAMIDE 10 MG TABLET: 10 | 20 days supply | Qty: 60 | Fill #3

## 2018-12-18 MED FILL — ?HUMALOG 100 UNITS/ML KWIKP: 100 | 33 days supply | Qty: 15 | Fill #1

## 2018-12-18 MED FILL — DULoxetine HCL 60 MG CPEP: 60 | 30 days supply | Qty: 30 | Fill #1

## 2018-12-22 MED FILL — $XIFAXAN 550 MG TABLET: 550 | 10 days supply | Qty: 30 | Fill #0

## 2018-12-22 MED FILL — PANTOPRAZOLE SOD DR 40 MG T: 40 | 30 days supply | Qty: 60 | Fill #3

## 2019-01-05 ENCOUNTER — Other Ambulatory Visit: Payer: Self-pay | Admitting: Physician Assistant

## 2019-01-05 ENCOUNTER — Other Ambulatory Visit: Payer: Self-pay | Admitting: Family Medicine

## 2019-01-05 DIAGNOSIS — E1043 Type 1 diabetes mellitus with diabetic autonomic (poly)neuropathy: Secondary | ICD-10-CM

## 2019-01-05 MED FILL — TERBINAFINE HCL 250 MG TAB: 250 | 30 days supply | Qty: 30 | Fill #1

## 2019-01-05 MED FILL — ATORVASTATIN CALCIUM 20 MG: 20 | 30 days supply | Qty: 30 | Fill #0

## 2019-01-06 NOTE — Telephone Encounter (Signed)
Needs to schedule an office visit

## 2019-01-06 NOTE — Telephone Encounter (Signed)
Please advise on refill request for Tramadol. Last seen 09-16-2018

## 2019-01-07 MED FILL — METOCLOPRAMIDE 10 MG TABLET: 10 | 20 days supply | Qty: 60 | Fill #0

## 2019-01-15 MED FILL — $XIFAXAN 550 MG TABLET: 550 | 10 days supply | Qty: 30 | Fill #1

## 2019-01-15 MED FILL — DILTIAZEM 24HR ER 180 MG CA: 180 | 30 days supply | Qty: 30 | Fill #2

## 2019-01-15 MED FILL — LISINOPRIL 40 MG TABLET: 40 | 30 days supply | Qty: 30 | Fill #2

## 2019-01-19 ENCOUNTER — Ambulatory Visit: Payer: Medicare Other | Attending: Family Medicine | Admitting: Family Medicine

## 2019-01-19 ENCOUNTER — Encounter: Payer: Self-pay | Admitting: Family Medicine

## 2019-01-19 ENCOUNTER — Telehealth: Payer: Self-pay | Admitting: Family Medicine

## 2019-01-19 DIAGNOSIS — F322 Major depressive disorder, single episode, severe without psychotic features: Secondary | ICD-10-CM

## 2019-01-19 DIAGNOSIS — R252 Cramp and spasm: Secondary | ICD-10-CM | POA: Diagnosis not present

## 2019-01-19 DIAGNOSIS — K3184 Gastroparesis: Secondary | ICD-10-CM | POA: Diagnosis not present

## 2019-01-19 DIAGNOSIS — K529 Noninfective gastroenteritis and colitis, unspecified: Secondary | ICD-10-CM | POA: Diagnosis not present

## 2019-01-19 DIAGNOSIS — E1043 Type 1 diabetes mellitus with diabetic autonomic (poly)neuropathy: Secondary | ICD-10-CM

## 2019-01-19 MED ORDER — METHOCARBAMOL 500 MG PO TABS: 500.0000 mg | ORAL_TABLET | Freq: Three times a day (TID) | ORAL | 1 refills | Status: AC | PRN

## 2019-01-19 NOTE — Telephone Encounter (Signed)
Can you please reach out to him with regards to his referral for psychotherapy?  Thank you.

## 2019-01-19 NOTE — Progress Notes (Signed)
Patient has been called and DOB has been verified. Patient has been screened and transferred to PCP to start phone visit.     

## 2019-01-19 NOTE — Progress Notes (Signed)
Virtual Visit via Telephone Note  I connected with Duane [name], on 01/19/2019 at 4:35 PM by telephone due to the COVID-19 pandemic and verified that I am speaking with the correct person using two identifiers.   Consent: I discussed the limitations, risks, security and privacy concerns of performing an evaluation and management service by telephone and the availability of in person appointments. I also discussed with the patient that there may be a patient responsible charge related to this service. The patient expressed understanding and agreed to proceed.   Location of Patient: Home  Location of Provider: Clinic   Persons participating in Telemedicine visit: Nadeem Romanoski Far[place]-CMA Dr. Margarita Rana     History of Present Illness: Duane [name] is a 43 year old male with history of type 1 diabetes mellitus (A1c 9.5), diabetic neuropathy, diabetic gastroparesis hypertension, stage III chronic kidney disease, depression who presents today for a follow-up visit.   He had a visit with the LCSW 2 months ago for follow-up of depression. States some days are better than others and on days he is not working he lacks motivation. Plan was to refer him to outside service after visit with LCSW. His major support is his Mom and other family are up [place] and he has no friends. The pandemic has also taken a toll on him. He has not taken Cymbalta in a while as he would like to see a therapist first . Most of his issues have to do with his health and that is what he attributes as the trigger of his depression.  His gastroparesis has been stable with no flares and he is followed by [name] and he is s/p Colonoscopy and upper endoscopy. Compliant with Xifaxan but still has diarrhea and abdominal gas; he sometimes has cramping and had no relief with IBgard. He thinks he is lactose intolerant.  His sugars have been "up and down" and he has been eating sugary stuff to keep him going. Highest  sugars have been in the 400 range. He thinks his emotional state affects his compliance with a diabetic diet.  Yet to have a visit with his endocrinologist.  At night when he goes to sleep his calf and lower legs go numb; he had previously complained of paresthesias whenever he flexed his knees like sitting on the toilet.  The lower extremity edema he previously had has subsided. He also has cramping a lot in legs, ribs, cramp.  He was previously on gabapentin which he had discontinued due to concerns that it worsened his vision and he was placed on Cymbalta.  Past Medical History:  Diagnosis Date  . Allergy   . Arthritis   . Chronic diarrhea   . Chronic kidney disease   . Depression   . Diabetes mellitus without complication [facility]) age 31   insulin requiring from the start  . Diabetic neuropathy ([place]) Dx 20110  . Diabetic proliferative retinopathy ([place]) 08/02/2017  . Gastritis   . Gastroparesis 2012   100% gastric retention on 08/2013 [facility]  . GERD (gastroesophageal reflux disease) Dx 2012  . Hx of adenomatous polyp of colon 10/05/2018  . Hyperlipidemia   . Hypertension Dx 2012  . Neuromuscular disorder ([place])   . Obesity    No Known Allergies  Current Outpatient Medications on File Prior to Visit  Medication Sig Dispense Refill  . atorvastatin (LIPITOR) 20 MG tablet Take 1 tablet (20 mg total) by mouth daily. 30 tablet 2  . Blood Glucose Monitoring Suppl (TRUE METRIX METER) W/DEVICE  KIT 1 each by Does not apply route 4 (four) times daily -  before meals and at bedtime. 1 kit ea  . diltiazem (CARTIA XT) 180 MG 24 hr capsule Take 1 capsule (180 mg total) by mouth daily. 30 capsule 3  . DULoxetine (CYMBALTA) 60 MG capsule Take 1 capsule (60 mg total) by mouth daily. 30 capsule 3  . furosemide (LASIX) 40 MG tablet Take 1.5 tablets (60 mg total) by mouth 2 (two) times daily. 90 tablet 3  . glucose blood (TRUE METRIX BLOOD GLUCOSE TEST) test strip 1 each by Other route 3 (three) times daily.  100 each 11  . hyoscyamine (LEVSIN) 0.125 MG tablet TAKE 1 TABLET (0.125 MG TOTAL) BY MOUTH 3 (THREE) TIMES DAILY WITH MEALS AS NEEDED. 90 tablet 2  . Insulin Glargine (LANTUS SOLOSTAR) 100 UNIT/ML Solostar Pen Inject 40 Units into the skin 2 (two) times daily. 30 mL 3  . insulin lispro (HUMALOG KWIKPEN) 100 UNIT/ML KwikPen INJECT 15 UNITS INTO THE SKIN 3 TIMES DAILY. 15 mL 2  . Insulin Pen Needle 31G X 8 MM MISC Inject 1 each into the skin 4 (four) times daily. Check blood sugar TID and QHS 120 each 11  . lisinopril (ZESTRIL) 40 MG tablet Take 1 tablet (40 mg total) by mouth daily. 90 tablet 0  . metoCLOPramide (REGLAN) 10 MG tablet TAKE 1 TABLET 3 TIMES DAILY BEFORE MEALS AS NEEDED FOR NAUSEA 60 tablet 3  . pantoprazole (PROTONIX) 40 MG tablet Take 1 tablet (40 mg total) by mouth 2 (two) times daily. 60 tablet 3  . Peppermint Oil (IBGARD) 90 MG CPCR Take 2 capsules by mouth 3 (three) times daily as needed. (Patient taking differently: Take 1 capsule by mouth 3 (three) times daily. ) 16 capsule 0  . promethazine (PHENERGAN) 25 MG tablet TAKE 1 TABLET BY MOUTH EVERY 8 HOURS AS NEEDED FOR NAUSEA OR VOMITING. (Patient taking differently: Take 25 mg by mouth every 6 (six) hours as needed for nausea or vomiting. ) 60 tablet 0  . sucralfate (CARAFATE) 1 g tablet TAKE 1 TABLET (1 G TOTAL) BY MOUTH 4 (FOUR) TIMES DAILY. 120 tablet 1  . terbinafine (LAMISIL) 250 MG tablet Take 1 tablet (250 mg total) by mouth daily. 90 tablet 0  . TRUEPLUS LANCETS 28G MISC 1 each by Does not apply route 4 (four) times daily -  before meals and at bedtime. 100 each 12  . XIFAXAN 550 MG TABS tablet TAKE 1 TABLET (550 MG TOTAL) BY MOUTH 3 (THREE) TIMES DAILY. 30 tablet 2  . gabapentin (NEURONTIN) 300 MG capsule Take 1 capsule (300 mg total) by mouth at bedtime. (Patient not taking: Reported on 01/19/2019) 30 capsule 6  . glucose blood (CONTOUR NEXT TEST) test strip Use as instructed (Patient not taking: Reported on 01/19/2019) 100  each 12  . glucose blood (CONTOUR NEXT TEST) test strip Use as instructed (Patient not taking: Reported on 01/19/2019) 100 each 12  . Pancrelipase, Lip-Prot-Amyl, (ZENPEP) 40000-126000 units CPEP Take 2 capsules by mouth 3 (three) times daily with meals. Take during meal (Patient not taking: Reported on 01/19/2019) 180 capsule 11  . potassium chloride (MICRO-K) 10 MEQ CR capsule Take 1 capsule (10 mEq total) by mouth daily. (Patient not taking: Reported on 01/19/2019) 30 capsule 3  . traMADol (ULTRAM) 50 MG tablet Take 1 tablet (50 mg total) by mouth every 6 (six) hours as needed. (Patient not taking: Reported on 01/19/2019) 30 tablet 0   Current Facility-Administered  Medications on File Prior to Visit  Medication Dose Route Frequency Provider Last Rate Last Dose  . 0.9 %  sodium chloride infusion  500 mL Intravenous Once [name], MD        Observations/Objective: Awake, alert, oriented x3 Not in acute distress  CMP Latest Ref Rng & Units 06/09/2018 04/01/2018 02/20/2018  Glucose 70 - 99 mg/dL 158(H) 120(H) 169(H)  BUN 6 - 23 mg/dL 15 16 21   Creatinine 0.40 - 1.50 mg/dL 1.72(H) 1.49 1.73(H)  Sodium 135 - 145 mEq/L 139 139 138  Potassium 3.5 - 5.1 mEq/L 3.8 3.8 4.5  Chloride 96 - 112 mEq/L 107 108 107  CO2 19 - 32 mEq/L 26 25 22   Calcium 8.4 - 10.5 mg/dL 8.1(L) 8.6 9.2  Total Protein 6.0 - 8.3 g/dL - 6.4 -  Total Bilirubin 0.2 - 1.2 mg/dL - 0.3 -  Alkaline Phos 39 - 117 U/L - 89 -  AST 0 - 37 U/L - 18 -  ALT 0 - 53 U/L - 20 -     Lab Results  Component Value Date   HGBA1C 9.3 (A) 10/21/2018    Assessment and Plan: 1. MDD (major depressive disorder), single episode, severe , no psychosis ([place]) Prescribed Cymbalta which he has not been taking He will benefit from psychotherapy I have reached out to the LCSW to contact this patient and schedule additional psychotherapy sessions He is declining psychiatry referral  2. Muscle cramp Commenced muscle relaxant We will check  potassium level, magnesium and phosphorus - Basic Metabolic Panel; Future - Magnesium; Future - Phosphorus; Future - methocarbamol (ROBAXIN) 500 MG tablet; Take 1 tablet (500 mg total) by mouth every 8 (eight) hours as needed for muscle spasms.  Dispense: 60 tablet; Refill: 1  3. Diabetic gastroparesis associated with type 1 diabetes mellitus ([place]) Diabetes is uncontrolled with A1c of 9.3 Lack of motivation playing a huge role with regards to compliance Hopefully psychotherapy will be beneficial Keep appointment with gastroenterology - Hemoglobin A1c; Future  4. Chronic diarrhea Uncontrolled on Xifaxan He will be seeing GI next week He may need to work on avoiding lactose in the event that this might bring about improvement H. pylori testing will be beneficial.   Follow Up Instructions: 3 months   I discussed the assessment and treatment plan with the patient. The patient was provided an opportunity to ask questions and all were answered. The patient agreed with the plan and demonstrated an understanding of the instructions.   The patient was advised to call back or seek an in-person evaluation if the symptoms worsen or if the condition fails to improve as anticipated.     I provided 25 minutes total of non-face-to-face time during this encounter including median intraservice time, reviewing previous notes, labs, imaging, medications, management and patient verbalized understanding.     Charlott Rakes, MD, FAAFP. [facility] [place], [place]   01/19/2019, 4:35 PM

## 2019-01-20 MED FILL — METHOCARBAMOL 500 MG TABS: 500 | 20 days supply | Qty: 60 | Fill #0

## 2019-01-27 MED FILL — ?HUMALOG 100 UNITS/ML KWIKP: 100 | 33 days supply | Qty: 15 | Fill #2

## 2019-01-27 MED FILL — ?SUCRALFATE 1 GM TABS: 1 | 30 days supply | Qty: 120 | Fill #1

## 2019-01-27 MED FILL — DULoxetine HCL 60 MG CPEP: 60 | 30 days supply | Qty: 30 | Fill #2

## 2019-01-27 MED FILL — ?BASAGLAR 100 UNITS/ML KWPE: 100 | 33 days supply | Qty: 27 | Fill #2

## 2019-01-29 ENCOUNTER — Ambulatory Visit (INDEPENDENT_AMBULATORY_CARE_PROVIDER_SITE_OTHER): Payer: Medicare Other | Admitting: Physician Assistant

## 2019-01-29 ENCOUNTER — Encounter: Payer: Self-pay | Admitting: Physician Assistant

## 2019-01-29 VITALS — BP 140/90 | HR 84 | Temp 97.4°F | Ht 76.0 in | Wt 339.9 lb

## 2019-01-29 DIAGNOSIS — E1343 Other specified diabetes mellitus with diabetic autonomic (poly)neuropathy: Secondary | ICD-10-CM

## 2019-01-29 DIAGNOSIS — K3184 Gastroparesis: Secondary | ICD-10-CM | POA: Diagnosis not present

## 2019-01-29 DIAGNOSIS — R197 Diarrhea, unspecified: Secondary | ICD-10-CM | POA: Diagnosis not present

## 2019-01-29 DIAGNOSIS — E1043 Type 1 diabetes mellitus with diabetic autonomic (poly)neuropathy: Secondary | ICD-10-CM

## 2019-01-29 MED ORDER — METOCLOPRAMIDE HCL 10 MG PO TABS: ORAL_TABLET | ORAL | 6 refills | Status: AC

## 2019-01-29 MED ORDER — PROMETHAZINE HCL 25 MG PO TABS: ORAL_TABLET | ORAL | 1 refills | Status: AC

## 2019-01-29 MED ORDER — PANTOPRAZOLE SODIUM 40 MG PO TBEC: 40.0000 mg | DELAYED_RELEASE_TABLET | Freq: Every day | ORAL | 6 refills | Status: AC

## 2019-01-29 MED FILL — ?PROMETHAZINE HCL 25MG TABS: AMNEAL | 13 days supply | Qty: 40 | Fill #0

## 2019-01-29 MED FILL — ?METOCLOPRAMIDE 10MG TABLET: 10 | 10 days supply | Qty: 30 | Fill #0

## 2019-01-29 MED FILL — PANTOPRAZOLE SOD DR 40 MG T: 40 | 30 days supply | Qty: 60 | Fill #0

## 2019-01-29 NOTE — Patient Instructions (Signed)
If you are age 42 or older, your body mass index should be between 23-30. Your Body mass index is 41.37 kg/m. If this is out of the aforementioned range listed, please consider follow up with your Primary Care Provider.  If you are age 54 or younger, your body mass index should be between 19-25. Your Body mass index is 41.37 kg/m. If this is out of the aformentioned range listed, please consider follow up with your Primary Care Provider.   You have been given a testing kit to check for small intestine bacterial overgrowth (SIBO) which is completed by a company named [employer]. Make sure to return your test in the mail using the return mailing label given you along with the kit. Your demographic and insurance information have already been sent to the company and they should be in contact with you over the next week regarding this test. Please keep in mind that you will be getting a call from phone number [phone] or a similar number. If you do not hear from them within this time frame, please call our office at [phone].   We have sent the following medications to your pharmacy for you to pick up at your convenience:   Protonix 40 mg BID Reglan 67m Phenergan 29m Stop Carafate and Zenpep After you finish taking Xifaxin start Florastor of cultural probiotic daily.  Thank you for choosing me and [facility]

## 2019-01-30 ENCOUNTER — Telehealth: Payer: Self-pay | Admitting: Licensed Clinical Social Worker

## 2019-01-30 NOTE — Telephone Encounter (Signed)
LCSW placed call to [facility] to follow up on psychiatry referral. Spoke with Ms. Duane [name] who successfully scheduled pt with an initial appointment for the partial hospitalization program on 02/02/2019.   LCSW placed call to patient to inform him of scheduled appointment, in addition, to [facility]'s contact number at [phone]. Pt appreciative for the assistance. No additional concerns noted.

## 2019-02-01 ENCOUNTER — Encounter: Payer: Self-pay | Admitting: Physician Assistant

## 2019-02-01 NOTE — Progress Notes (Signed)
Subjective:    Patient ID: Duane [name], male    DOB: Oct 12, 1975, 43 y.o.   MRN: [id number]  HPI Duane [name] is a pleasant 43 year old African-American male, known to Dr. Carlean Purl who has been followed for gastroparesis and IBS.  He has history of chronic diarrhea. Also with hypertension, GERD, adult onset diabetes mellitus-insulin-dependent and chronic kidney disease stage III. He was last seen in the office on 09/16/2018.  Previous celiac markers negative, failed trial of Questran.  No Viberzi as status post cholecystectomy. He underwent colonoscopy in August 2020 which was negative other than a 2 mm polyp removed from the transverse colon which was hyperplastic.  Random biopsies were negative for microscopic colitis.  He also had EGD in August 2020 which was normal. He has been treated with Xifaxan in the past with some success not complete resolution of symptoms. He just recently restarted a course of Xifaxan and says it definitely helps with the diarrhea and helps somewhat with the gas. He complains of ongoing problems with burping gas bloating and belching which eventually seems to lead to diarrhea.  This does not always occur postprandially.  He does feel it is worse with dairy products and would like to be tested for lactose intolerance.  He also has some increase in symptoms with heavier greasy or foods.  He admits he has not been eating well recently, eating a lot of fast food etc. and has also been dealing with depression issues. He remains on metoclopramide 10 mg 3 times daily for gastroparesis, has not been taking Zenpep which had previously been prescribed because did not notice much difference.  He uses Levsin as needed, continues Protonix 40 mg twice daily and Carafate 1 g 3 times daily.  Trial of IBgard in the past without benefit.  Review of Systems Pertinent positive and negative review of systems were noted in the above HPI section.  All other review of systems was otherwise negative.   Outpatient Encounter Medications as of 01/29/2019  Medication Sig  . atorvastatin (LIPITOR) 20 MG tablet Take 1 tablet (20 mg total) by mouth daily.  . Blood Glucose Monitoring Suppl (TRUE METRIX METER) W/DEVICE KIT 1 each by Does not apply route 4 (four) times daily -  before meals and at bedtime.  Marland Kitchen diltiazem (CARTIA XT) 180 MG 24 hr capsule Take 1 capsule (180 mg total) by mouth daily.  . DULoxetine (CYMBALTA) 60 MG capsule Take 1 capsule (60 mg total) by mouth daily.  . furosemide (LASIX) 40 MG tablet Take 1.5 tablets (60 mg total) by mouth 2 (two) times daily.  Marland Kitchen gabapentin (NEURONTIN) 300 MG capsule Take 1 capsule (300 mg total) by mouth at bedtime.  Marland Kitchen glucose blood (CONTOUR NEXT TEST) test strip Use as instructed  . glucose blood (CONTOUR NEXT TEST) test strip Use as instructed  . glucose blood (TRUE METRIX BLOOD GLUCOSE TEST) test strip 1 each by Other route 3 (three) times daily.  . hyoscyamine (LEVSIN) 0.125 MG tablet TAKE 1 TABLET (0.125 MG TOTAL) BY MOUTH 3 (THREE) TIMES DAILY WITH MEALS AS NEEDED.  Marland Kitchen Insulin Glargine (LANTUS SOLOSTAR) 100 UNIT/ML Solostar Pen Inject 40 Units into the skin 2 (two) times daily.  . insulin lispro (HUMALOG KWIKPEN) 100 UNIT/ML KwikPen INJECT 15 UNITS INTO THE SKIN 3 TIMES DAILY.  Marland Kitchen Insulin Pen Needle 31G X 8 MM MISC Inject 1 each into the skin 4 (four) times daily. Check blood sugar TID and QHS  . lisinopril (ZESTRIL) 40 MG tablet Take 1  tablet (40 mg total) by mouth daily.  . methocarbamol (ROBAXIN) 500 MG tablet Take 1 tablet (500 mg total) by mouth every 8 (eight) hours as needed for muscle spasms.  . metoCLOPramide (REGLAN) 10 MG tablet Take a half hour before meals  . Pancrelipase, Lip-Prot-Amyl, (ZENPEP) 40000-126000 units CPEP Take 2 capsules by mouth 3 (three) times daily with meals. Take during meal  . pantoprazole (PROTONIX) 40 MG tablet Take 1 tablet (40 mg total) by mouth 2 (two) times daily.  Marland Kitchen Peppermint Oil (IBGARD) 90 MG CPCR Take 2  capsules by mouth 3 (three) times daily as needed. (Patient taking differently: Take 1 capsule by mouth 3 (three) times daily. )  . potassium chloride (MICRO-K) 10 MEQ CR capsule Take 1 capsule (10 mEq total) by mouth daily.  . promethazine (PHENERGAN) 25 MG tablet TAKE 1 TABLET BY MOUTH EVERY 8 HOURS AS NEEDED FOR NAUSEA OR VOMITING.  . sucralfate (CARAFATE) 1 g tablet TAKE 1 TABLET (1 G TOTAL) BY MOUTH 4 (FOUR) TIMES DAILY.  Marland Kitchen terbinafine (LAMISIL) 250 MG tablet Take 1 tablet (250 mg total) by mouth daily.  . traMADol (ULTRAM) 50 MG tablet Take 1 tablet (50 mg total) by mouth every 6 (six) hours as needed.  . TRUEPLUS LANCETS 28G MISC 1 each by Does not apply route 4 (four) times daily -  before meals and at bedtime.  Marland Kitchen XIFAXAN 550 MG TABS tablet TAKE 1 TABLET (550 MG TOTAL) BY MOUTH 3 (THREE) TIMES DAILY.  . [DISCONTINUED] metoCLOPramide (REGLAN) 10 MG tablet TAKE 1 TABLET 3 TIMES DAILY BEFORE MEALS AS NEEDED FOR NAUSEA  . [DISCONTINUED] promethazine (PHENERGAN) 25 MG tablet TAKE 1 TABLET BY MOUTH EVERY 8 HOURS AS NEEDED FOR NAUSEA OR VOMITING. (Patient taking differently: Take 25 mg by mouth every 6 (six) hours as needed for nausea or vomiting. )  . pantoprazole (PROTONIX) 40 MG tablet Take 1 tablet (40 mg total) by mouth daily.  . [DISCONTINUED] 0.9 %  sodium chloride infusion    No facility-administered encounter medications on file as of 01/29/2019.   No Known Allergies Patient Active Problem List   Diagnosis Date Noted  . Hx of adenomatous polyp of colon 10/05/2018  . Upper respiratory infection with cough and congestion 03/21/2018  . Respiratory infection 03/21/2018  . Skin ulcer of right foot, limited to breakdown of skin ([place]) 03/21/2018  . Obesity 01/02/2018  . Gastroparesis 12/21/2017  . CKD stage 3 secondary to diabetes ([place]) 12/21/2017  . Depression 11/20/2017  . Diabetic enteropathy ([place]) likely 10/11/2017  . Diabetic proliferative retinopathy ([place]) 08/02/2017  . Insomnia  05/17/2017  . Pedal edema 10/16/2016  . Chronic kidney disease 10/16/2016  . Vitamin D deficiency 08/21/2016  . Leg swelling 08/21/2016  . Excessive sweating 08/21/2016  . Status post cholecystectomy 10/16/2015  . Onychomycosis of toenail 04/22/2015  . Erectile dysfunction 04/22/2015  . Diabetic gastroparesis associated with type 1 diabetes mellitus ([place]) 01/01/2015  . Skin tag 09/17/2014  . Pain and swelling of left knee 09/13/2014  . Chronic diarrhea 09/13/2014  . Benign paroxysmal positional vertigo 07/15/2014  . MDD (major depressive disorder), single episode, severe , no psychosis ([place]) 05/19/2014  . GERD (gastroesophageal reflux disease) 05/05/2014  . Type I diabetes mellitus with complication, uncontrolled ([place])   . Diabetic neuropathy ([place]) 08/28/2013  . Abdominal pain 02/09/2013  . DM type 1 (diabetes mellitus, type 1) ([place]) 01/09/2013  . HTN (hypertension) 01/09/2013   Social History   Socioeconomic History  . Marital status:  Single    Spouse name: Not on file  . Number of children: Not on file  . Years of education: Not on file  . Highest education level: Not on file  Occupational History  . 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  difficult to work due to n/v.   Tobacco Use  . Smoking status: Never Smoker  . Smokeless tobacco: Never Used  Substance and Sexual Activity  . Alcohol use: No  . Drug use: No  . Sexual activity: Not on file  Other Topics Concern  . Not on file  Social History Narrative   He is single and a disabled security guard   1 son at least, I do not know if he has other children   No alcohol tobacco or drug use reported no smoking   Social Determinants of Health   Financial Resource Strain:   . Difficulty of Paying Living Expenses: Not on file  Food Insecurity:   . Worried About Charity fundraiser in the Last Year: Not on file  . Ran Out of Food in the Last Year: Not on file  Transportation Needs:   . Lack of  Transportation (Medical): Not on file  . Lack of Transportation (Non-Medical): Not on file  Physical Activity:   . Days of Exercise per Week: Not on file  . Minutes of Exercise per Session: Not on file  Stress:   . Feeling of Stress : Not on file  Social Connections:   . Frequency of Communication with Friends and Family: Not on file  . Frequency of Social Gatherings with Friends and Family: Not on file  . Attends Religious Services: Not on file  . Active Member of Clubs or Organizations: Not on file  . Attends Archivist Meetings: Not on file  . Marital Status: Not on file  Intimate Partner Violence:   . Fear of Current or Ex-Partner: Not on file  . Emotionally Abused: Not on file  . Physically Abused: Not on file  . Sexually Abused: Not on file    Mr. Encinas family history includes Colon cancer in his cousin; Diabetes in his father, maternal grandmother, and paternal grandmother; Lung cancer in his paternal grandfather; Pancreatic cancer in his paternal grandfather.      Objective:    Vitals:   01/29/19 1153  BP: 140/90  Pulse: 84  Temp: (!) 97.4 F (36.3 C)    Physical Exam Well-developed well-nourished African-American male in no acute distress.  , Weight, 339 BMI 41.3 HEENT; nontraumatic normocephalic, EOMI, PE R LA, sclera anicteric. Oropharynx; not examined/mask/Covid neck; supple, no JVD Cardiovascular; regular rate and rhythm with S1-S2, no murmur rub or gallop Pulmonary; Clear bilaterally Abdomen; soft, obese, no focal tenderness, no succussion splash, nondistended, no palpable mass or hepatosplenomegaly, bowel sounds are active Rectal; not done today Skin; benign exam, no jaundice rash or appreciable lesions Extremities; no clubbing cyanosis or edema skin warm and dry Neuro/Psych; alert and oriented x4, grossly nonfocal mood and affect appropriate       Assessment & Plan:   #22 43 year old male with gastroparesis, secondary to adult onset  diabetes mellitus-insulin-dependent Though with persistent burping and bloating  Stable on metoclopramide 10 mg 3 times daily AC  #2 GERD-stable on twice daily PPI #3 chronic diarrhea-this is most likely IBS-D and/or component of bacterial overgrowth. Work-up has other wise been negative including colonoscopy with random biopsies. Consider component of medication effect secondary to metoclopramide. Patient generally has  some response to Xifaxan #4 probable lactose intolerance likely contributing to above  #5 obesity 6.  Hypertension 7.  Chronic kidney disease stage III #8 history of hyperplastic colon polyp  Plan; we discussed importance of diet in helping to regulate his gastroparesis symptoms, GERD symptoms and diarrhea.  Avoid lactose. He will complete current course of Xifaxan We will proceed with breath testing to confirm lactose intolerance which may help him with compliance. Continue metoclopramide 10 mg AC Continue twice daily Protonix Stop Carafate Continue Phenergan as needed Continue Levsin sublingual as needed. If symptoms persist after completing Xifaxan consider decreasing metoclopramide to 5 mg AC. , 02/01/2019   Cc: Charlott Rakes, MD

## 2019-02-02 ENCOUNTER — Other Ambulatory Visit (HOSPITAL_COMMUNITY): Payer: Medicare Other

## 2019-02-02 ENCOUNTER — Other Ambulatory Visit: Payer: Self-pay

## 2019-02-02 ENCOUNTER — Telehealth (HOSPITAL_COMMUNITY): Payer: Self-pay | Admitting: Professional

## 2019-02-02 NOTE — Telephone Encounter (Signed)
Cln rtn pt call. Cln orients pt to group. Pt reports he does not have tx hx and does not want to start with group; would prefer to start with individual counseling and psychiatry. Cln will contact front desk to cancel CCA for PHP and get pt scheduled with therapist and psychiatrist. Pt reports afternoon appointments are better; denies preference for M/F therapist or psychiatrist. Pt states cln can leave appt times on vm.

## 2019-02-10 ENCOUNTER — Other Ambulatory Visit: Payer: Self-pay

## 2019-02-10 ENCOUNTER — Ambulatory Visit (INDEPENDENT_AMBULATORY_CARE_PROVIDER_SITE_OTHER): Payer: Medicare Other | Admitting: Licensed Clinical Social Worker

## 2019-02-10 DIAGNOSIS — F333 Major depressive disorder, recurrent, severe with psychotic symptoms: Secondary | ICD-10-CM | POA: Diagnosis not present

## 2019-02-10 DIAGNOSIS — F411 Generalized anxiety disorder: Secondary | ICD-10-CM
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
## 2019-02-16 MED FILL — LISINOPRIL 40 MG TABLET: 40 | 30 days supply | Qty: 30 | Fill #2

## 2019-02-16 MED FILL — ?ATORVASTATIN 20 MG TABLET: 20 | 30 days supply | Qty: 30 | Fill #1

## 2019-02-16 MED FILL — DILTIAZEM 24HR ER 180 MG CA: 180 | 30 days supply | Qty: 30 | Fill #3

## 2019-02-17 MED FILL — GABAPENTIN 300 MG CAPSULE: 300 | 30 days supply | Qty: 30 | Fill #1

## 2019-02-23 ENCOUNTER — Emergency Department (HOSPITAL_COMMUNITY)
Admission: EM | Admit: 2019-02-23 | Discharge: 2019-02-23 | Disposition: A | Discharge status: Home / Self Care | Payer: Medicare Other | Source: Home / Self Care | Attending: Emergency Medicine | Admitting: Emergency Medicine

## 2019-02-23 ENCOUNTER — Other Ambulatory Visit: Payer: Self-pay

## 2019-02-23 ENCOUNTER — Encounter (HOSPITAL_COMMUNITY): Payer: Self-pay | Admitting: Emergency Medicine

## 2019-02-23 DIAGNOSIS — J1282 Pneumonia due to coronavirus disease 2019: Secondary | ICD-10-CM | POA: Diagnosis present

## 2019-02-23 DIAGNOSIS — U071 COVID-19: Principal | ICD-10-CM | POA: Diagnosis present

## 2019-02-23 DIAGNOSIS — E1143 Type 2 diabetes mellitus with diabetic autonomic (poly)neuropathy: Secondary | ICD-10-CM | POA: Diagnosis not present

## 2019-02-23 DIAGNOSIS — E875 Hyperkalemia: Secondary | ICD-10-CM | POA: Diagnosis present

## 2019-02-23 DIAGNOSIS — R112 Nausea with vomiting, unspecified: Secondary | ICD-10-CM | POA: Diagnosis not present

## 2019-02-23 DIAGNOSIS — Z833 Family history of diabetes mellitus: Secondary | ICD-10-CM

## 2019-02-23 DIAGNOSIS — R52 Pain, unspecified: Secondary | ICD-10-CM | POA: Diagnosis not present

## 2019-02-23 DIAGNOSIS — Z03818 Encounter for observation for suspected exposure to other biological agents ruled out: Secondary | ICD-10-CM | POA: Diagnosis not present

## 2019-02-23 DIAGNOSIS — K3184 Gastroparesis: Secondary | ICD-10-CM | POA: Diagnosis present

## 2019-02-23 DIAGNOSIS — E1040 Type 1 diabetes mellitus with diabetic neuropathy, unspecified: Secondary | ICD-10-CM | POA: Diagnosis present

## 2019-02-23 DIAGNOSIS — E1022 Type 1 diabetes mellitus with diabetic chronic kidney disease: Secondary | ICD-10-CM | POA: Diagnosis present

## 2019-02-23 DIAGNOSIS — J9601 Acute respiratory failure with hypoxia: Secondary | ICD-10-CM | POA: Diagnosis not present

## 2019-02-23 DIAGNOSIS — E1043 Type 1 diabetes mellitus with diabetic autonomic (poly)neuropathy: Secondary | ICD-10-CM | POA: Diagnosis present

## 2019-02-23 DIAGNOSIS — R000 Tachycardia, unspecified: Secondary | ICD-10-CM | POA: Diagnosis not present

## 2019-02-23 DIAGNOSIS — E8770 Fluid overload, unspecified: Secondary | ICD-10-CM | POA: Diagnosis present

## 2019-02-23 DIAGNOSIS — I129 Hypertensive chronic kidney disease with stage 1 through stage 4 chronic kidney disease, or unspecified chronic kidney disease: Secondary | ICD-10-CM | POA: Insufficient documentation

## 2019-02-23 DIAGNOSIS — Z6841 Body Mass Index (BMI) 40.0 and over, adult: Secondary | ICD-10-CM | POA: Diagnosis not present

## 2019-02-23 DIAGNOSIS — E785 Hyperlipidemia, unspecified: Secondary | ICD-10-CM | POA: Diagnosis present

## 2019-02-23 DIAGNOSIS — E870 Hyperosmolality and hypernatremia: Secondary | ICD-10-CM | POA: Diagnosis not present

## 2019-02-23 DIAGNOSIS — I10 Essential (primary) hypertension: Secondary | ICD-10-CM | POA: Diagnosis not present

## 2019-02-23 DIAGNOSIS — R1010 Upper abdominal pain, unspecified: Secondary | ICD-10-CM | POA: Diagnosis not present

## 2019-02-23 DIAGNOSIS — E1165 Type 2 diabetes mellitus with hyperglycemia: Secondary | ICD-10-CM | POA: Diagnosis not present

## 2019-02-23 DIAGNOSIS — I160 Hypertensive urgency: Secondary | ICD-10-CM | POA: Diagnosis present

## 2019-02-23 DIAGNOSIS — N179 Acute kidney failure, unspecified: Secondary | ICD-10-CM | POA: Diagnosis not present

## 2019-02-23 DIAGNOSIS — N1832 Chronic kidney disease, stage 3b: Secondary | ICD-10-CM | POA: Diagnosis present

## 2019-02-23 DIAGNOSIS — Z9049 Acquired absence of other specified parts of digestive tract: Secondary | ICD-10-CM

## 2019-02-23 DIAGNOSIS — Z765 Malingerer [conscious simulation]: Secondary | ICD-10-CM

## 2019-02-23 DIAGNOSIS — N183 Chronic kidney disease, stage 3 unspecified: Secondary | ICD-10-CM | POA: Insufficient documentation

## 2019-02-23 DIAGNOSIS — F329 Major depressive disorder, single episode, unspecified: Secondary | ICD-10-CM | POA: Diagnosis present

## 2019-02-23 DIAGNOSIS — E1065 Type 1 diabetes mellitus with hyperglycemia: Secondary | ICD-10-CM | POA: Diagnosis present

## 2019-02-23 DIAGNOSIS — Z79899 Other long term (current) drug therapy: Secondary | ICD-10-CM | POA: Insufficient documentation

## 2019-02-23 DIAGNOSIS — K219 Gastro-esophageal reflux disease without esophagitis: Secondary | ICD-10-CM | POA: Diagnosis present

## 2019-02-23 DIAGNOSIS — R1084 Generalized abdominal pain: Secondary | ICD-10-CM | POA: Diagnosis not present

## 2019-02-23 DIAGNOSIS — Z794 Long term (current) use of insulin: Secondary | ICD-10-CM

## 2019-02-23 LAB — URINALYSIS, ROUTINE W REFLEX MICROSCOPIC
Bilirubin Urine: NEGATIVE
Glucose, UA: >=500 mg/dL — AB
Ketones, ur: NEGATIVE mg/dL
Leukocytes,Ua: NEGATIVE
Nitrite: NEGATIVE
Protein, ur: >=300 mg/dL — AB
Specific Gravity, Urine: 1.021 (ref 1.005–1.030)
pH: 5 (ref 5.0–8.0)

## 2019-02-23 LAB — COMPREHENSIVE METABOLIC PANEL
ALT: 23 U/L (ref 0–44)
AST: 24 U/L (ref 15–41)
Albumin: 3.1 g/dL — ABNORMAL LOW (ref 3.5–5.0)
Alkaline Phosphatase: 83 U/L (ref 38–126)
Anion gap: 11 (ref 5–15)
BUN: 24 mg/dL — ABNORMAL HIGH (ref 6–20)
CO2: 23 mmol/L (ref 22–32)
Calcium: 8.4 mg/dL — ABNORMAL LOW (ref 8.9–10.3)
Chloride: 106 mmol/L (ref 98–111)
Creatinine, Ser: 2.55 mg/dL — ABNORMAL HIGH (ref 0.61–1.24)
GFR calc Af Amer: 35 mL/min — ABNORMAL LOW (ref >60)
GFR calc non Af Amer: 30 mL/min — ABNORMAL LOW (ref >60)
Glucose, Bld: 253 mg/dL — ABNORMAL HIGH (ref 70–99)
Potassium: 4.5 mmol/L (ref 3.5–5.1)
Sodium: 140 mmol/L (ref 135–145)
Total Bilirubin: 0.5 mg/dL (ref 0.3–1.2)
Total Protein: 7.1 g/dL (ref 6.5–8.1)

## 2019-02-23 LAB — CBC
HCT: 42.3 % (ref 39.0–52.0)
Hemoglobin: 12.9 g/dL — ABNORMAL LOW (ref 13.0–17.0)
MCH: 26.1 pg (ref 26.0–34.0)
MCHC: 30.5 g/dL (ref 30.0–36.0)
MCV: 85.5 fL (ref 80.0–100.0)
Platelets: 225 10*3/uL (ref 150–400)
RBC: 4.95 MIL/uL (ref 4.22–5.81)
RDW: 13.1 % (ref 11.5–15.5)
WBC: 6.2 10*3/uL (ref 4.0–10.5)
nRBC: 0 % (ref 0.0–0.2)

## 2019-02-23 LAB — LIPASE, BLOOD: Lipase: 19 U/L (ref 11–51)

## 2019-02-23 LAB — BLOOD GAS, VENOUS
Acid-base deficit: 0.3 mmol/L (ref 0.0–2.0)
Bicarbonate: 25.8 mmol/L (ref 20.0–28.0)
O2 Saturation: 34.1 %
Patient temperature: 98.6
pCO2, Ven: 50.6 mmHg (ref 44.0–60.0)
pH, Ven: 7.328 (ref 7.250–7.430)
pO2, Ven: <31 mmHg — CL (ref 32.0–45.0)

## 2019-02-23 LAB — CBG MONITORING, ED: Glucose-Capillary: 202 mg/dL — ABNORMAL HIGH (ref 70–99)

## 2019-02-23 MED ORDER — PROMETHAZINE HCL 25 MG/ML IJ SOLN
12.5000 mg | Freq: Once | INTRAMUSCULAR | Status: AC
  Administered 2019-02-23: 03:00:00 12.5 mg via INTRAVENOUS
  Filled 2019-02-23: qty 1

## 2019-02-23 MED ORDER — HYDROMORPHONE HCL 1 MG/ML IJ SOLN
1.0000 mg | Freq: Once | INTRAMUSCULAR | Status: AC
  Administered 2019-02-23: 06:00:00 1 mg via INTRAVENOUS
  Filled 2019-02-23: qty 1

## 2019-02-23 MED ORDER — TRAMADOL HCL 50 MG PO TABS: 50.0000 mg | ORAL_TABLET | Freq: Four times a day (QID) | ORAL | 0 refills | Status: DC | PRN

## 2019-02-23 MED ORDER — SODIUM CHLORIDE 0.9 % IV BOLUS
1000.0000 mL | Freq: Once | INTRAVENOUS | Status: AC
  Administered 2019-02-23: 02:00:00 1000 mL via INTRAVENOUS

## 2019-02-23 MED ORDER — HYDROMORPHONE HCL 1 MG/ML IJ SOLN
1.0000 mg | Freq: Once | INTRAMUSCULAR | Status: AC
  Administered 2019-02-23: 02:00:00 1 mg via INTRAVENOUS
  Filled 2019-02-23: qty 1

## 2019-02-23 MED ORDER — METOCLOPRAMIDE HCL 5 MG/ML IJ SOLN
10.0000 mg | Freq: Once | INTRAMUSCULAR | Status: AC
  Administered 2019-02-23: 10 mg via INTRAVENOUS
  Filled 2019-02-23: qty 2

## 2019-02-23 MED ORDER — SODIUM CHLORIDE 0.9 % IV BOLUS
1000.0000 mL | Freq: Once | INTRAVENOUS | Status: AC
  Administered 2019-02-23: 1000 mL via INTRAVENOUS

## 2019-02-23 MED ORDER — SODIUM CHLORIDE 0.9% FLUSH: 3.0000 mL | Freq: Once | INTRAVENOUS | Status: DC

## 2019-02-23 MED ORDER — DIPHENHYDRAMINE HCL 50 MG/ML IJ SOLN
12.5000 mg | Freq: Once | INTRAMUSCULAR | Status: AC
  Administered 2019-02-23: 02:00:00 12.5 mg via INTRAVENOUS
  Filled 2019-02-23: qty 1

## 2019-02-23 MED FILL — traMADol HCL 50 MG TABS: 50 | 3 days supply | Qty: 15 | Fill #0

## 2019-02-23 NOTE — Discharge Instructions (Addendum)
Continue your regular medications at home. Tramadol has been called into your pharmacy for pain relief.   See your doctor if symptoms continue, but return to the ED with any worsening symptoms or new concerns.

## 2019-02-23 NOTE — ED Provider Notes (Signed)
[facility] DEPT Provider Note   CSN: [id number] Arrival date & time: 02/23/19  0048     History Chief Complaint  Patient presents with  . Emesis    Duane [name] is a 44 y.o. male.  Patient with history of HTN, T1DM, gastroparesis presents with abdominal pain, nausea and vomiting since earlier yesterday, 02/22/19. No fever, hematemesis, diarrhea. No cough, chest pain or congestion. Home medications without relief. Pain in the abdomen is across upper quadrants c/w previous flare up episodes of gastroparesis.   The history is provided by the patient. No language interpreter was used.  Emesis Associated symptoms: abdominal pain   Associated symptoms: no chills, no fever and no myalgias        Past Medical History:  Diagnosis Date  . Allergy   . Arthritis   . Chronic diarrhea   . Chronic kidney disease   . Depression   . Diabetes mellitus without complication [facility]) age 32   insulin requiring from the start  . Diabetic neuropathy ([place]) Dx [address]  . Diabetic proliferative retinopathy ([place]) 08/02/2017  . Gastritis   . Gastroparesis 2012   100% gastric retention on 08/2013 GES  . GERD (gastroesophageal reflux disease) Dx 2012  . Hx of adenomatous polyp of colon 10/05/2018  . Hyperlipidemia   . Hypertension Dx 2012  . Neuromuscular disorder ([place])   . Obesity     Patient Active Problem List   Diagnosis Date Noted  . Hx of adenomatous polyp of colon 10/05/2018  . Upper respiratory infection with cough and congestion 03/21/2018  . Respiratory infection 03/21/2018  . Skin ulcer of right foot, limited to breakdown of skin ([place]) 03/21/2018  . Obesity 01/02/2018  . Gastroparesis 12/21/2017  . CKD stage 3 secondary to diabetes ([place]) 12/21/2017  . Depression 11/20/2017  . Diabetic enteropathy ([place]) likely 10/11/2017  . Diabetic proliferative retinopathy ([place]) 08/02/2017  . Insomnia 05/17/2017  . Pedal edema 10/16/2016  . Chronic kidney disease  10/16/2016  . Vitamin D deficiency 08/21/2016  . Leg swelling 08/21/2016  . Excessive sweating 08/21/2016  . Status post cholecystectomy 10/16/2015  . Onychomycosis of toenail 04/22/2015  . Erectile dysfunction 04/22/2015  . Diabetic gastroparesis associated with type 1 diabetes mellitus ([place]) 01/01/2015  . Skin tag 09/17/2014  . Pain and swelling of left knee 09/13/2014  . Chronic diarrhea 09/13/2014  . Benign paroxysmal positional vertigo 07/15/2014  . MDD (major depressive disorder), single episode, severe , no psychosis ([place]) 05/19/2014  . GERD (gastroesophageal reflux disease) 05/05/2014  . Type I diabetes mellitus with complication, uncontrolled ([place])   . Diabetic neuropathy ([place]) 08/28/2013  . Abdominal pain 02/09/2013  . DM type 1 (diabetes mellitus, type 1) ([place]) 01/09/2013  . HTN (hypertension) 01/09/2013    Past Surgical History:  Procedure Laterality Date  . CHOLECYSTECTOMY  2013  . ESOPHAGOGASTRODUODENOSCOPY N/A 06/11/2013   Procedure: ESOPHAGOGASTRODUODENOSCOPY (EGD);  Surgeon: [name], MD;  Location: [facility];  Service: Endoscopy;  Laterality: N/A;  . EYE SURGERY Left   . FLEXIBLE SIGMOIDOSCOPY N/A 06/11/2013   Procedure: FLEXIBLE SIGMOIDOSCOPY;  Surgeon: [name], MD;  Location: [facility];  Service: Endoscopy;  Laterality: N/A;  . KNEE SURGERY Left        Family History  Problem Relation Age of Onset  . Diabetes Father   . Diabetes Maternal Grandmother   . Diabetes Paternal Grandmother   . Colon cancer Cousin   . Pancreatic cancer Paternal Grandfather   . Lung cancer Paternal  Grandfather        mets from pancreas  . Stomach cancer Neg Hx   . Rectal cancer Neg Hx   . Esophageal cancer Neg Hx     Social History   Tobacco Use  . Smoking status: Never Smoker  . Smokeless tobacco: Never Used  Substance Use Topics  . Alcohol use: No  . Drug use: No    Home Medications Prior to Admission medications   Medication Sig Start Date End  Date Taking? Authorizing Provider  atorvastatin (LIPITOR) 20 MG tablet Take 1 tablet (20 mg total) by mouth daily. 10/21/18   Charlott Rakes, MD  Blood Glucose Monitoring Suppl (TRUE METRIX METER) W/DEVICE KIT 1 each by Does not apply route 4 (four) times daily -  before meals and at bedtime. 11/05/14   Funches, Adriana Mccallum, MD  diltiazem (CARTIA XT) 180 MG 24 hr capsule Take 1 capsule (180 mg total) by mouth daily. 10/21/18   Charlott Rakes, MD  DULoxetine (CYMBALTA) 60 MG capsule Take 1 capsule (60 mg total) by mouth daily. 10/21/18   Charlott Rakes, MD  furosemide (LASIX) 40 MG tablet Take 1.5 tablets (60 mg total) by mouth 2 (two) times daily. 06/18/18   Charlott Rakes, MD  gabapentin (NEURONTIN) 300 MG capsule Take 1 capsule (300 mg total) by mouth at bedtime. 10/21/18   Charlott Rakes, MD  glucose blood (CONTOUR NEXT TEST) test strip Use as instructed 10/11/17   Elayne Snare, MD  glucose blood (CONTOUR NEXT TEST) test strip Use as instructed 03/21/18   Kerin Perna, NP  glucose blood (TRUE METRIX BLOOD GLUCOSE TEST) test strip 1 each by Other route 3 (three) times daily. 08/20/16   Funches, Adriana Mccallum, MD  hyoscyamine (LEVSIN) 0.125 MG tablet TAKE 1 TABLET (0.125 MG TOTAL) BY MOUTH 3 (THREE) TIMES DAILY WITH MEALS AS NEEDED. 07/28/18   Esterwood, Amy S, PA-C  Insulin Glargine (LANTUS SOLOSTAR) 100 UNIT/ML Solostar Pen Inject 40 Units into the skin 2 (two) times daily. 10/21/18   Charlott Rakes, MD  insulin lispro (HUMALOG KWIKPEN) 100 UNIT/ML KwikPen INJECT 15 UNITS INTO THE SKIN 3 TIMES DAILY. 10/21/18   Charlott Rakes, MD  Insulin Pen Needle 31G X 8 MM MISC Inject 1 each into the skin 4 (four) times daily. Check blood sugar TID and QHS 06/15/16   Funches, Josalyn, MD  lisinopril (ZESTRIL) 40 MG tablet Take 1 tablet (40 mg total) by mouth daily. 10/21/18   Charlott Rakes, MD  methocarbamol (ROBAXIN) 500 MG tablet Take 1 tablet (500 mg total) by mouth every 8 (eight) hours as needed for muscle spasms. 01/19/19    Charlott Rakes, MD  metoCLOPramide (REGLAN) 10 MG tablet Take a half hour before meals 01/29/19   Esterwood, Amy S, PA-C  Pancrelipase, Lip-Prot-Amyl, (ZENPEP) 40000-126000 units CPEP Take 2 capsules by mouth 3 (three) times daily with meals. Take during meal 02/28/18   Esterwood, Amy S, PA-C  pantoprazole (PROTONIX) 40 MG tablet Take 1 tablet (40 mg total) by mouth 2 (two) times daily. 07/14/18   Charlott Rakes, MD  pantoprazole (PROTONIX) 40 MG tablet Take 1 tablet (40 mg total) by mouth daily. 01/29/19   Esterwood, Amy S, PA-C  Peppermint Oil (IBGARD) 90 MG CPCR Take 2 capsules by mouth 3 (three) times daily as needed. Patient taking differently: Take 1 capsule by mouth 3 (three) times daily.  10/11/17   [name], MD  potassium chloride (MICRO-K) 10 MEQ CR capsule Take 1 capsule (10 mEq total) by mouth  daily. 04/07/18   Charlott Rakes, MD  promethazine (PHENERGAN) 25 MG tablet TAKE 1 TABLET BY MOUTH EVERY 8 HOURS AS NEEDED FOR NAUSEA OR VOMITING. 01/29/19   Esterwood, Amy S, PA-C  sucralfate (CARAFATE) 1 g tablet TAKE 1 TABLET (1 G TOTAL) BY MOUTH 4 (FOUR) TIMES DAILY. 11/06/18   Charlott Rakes, MD  terbinafine (LAMISIL) 250 MG tablet Take 1 tablet (250 mg total) by mouth daily. 11/17/18   Edrick Kins, DPM  traMADol (ULTRAM) 50 MG tablet Take 1 tablet (50 mg total) by mouth every 6 (six) hours as needed. 03/06/18   Esterwood, Amy S, PA-C  TRUEPLUS LANCETS 28G MISC 1 each by Does not apply route 4 (four) times daily -  before meals and at bedtime. 11/04/14   Funches, Josalyn, MD  XIFAXAN 550 MG TABS tablet TAKE 1 TABLET (550 MG TOTAL) BY MOUTH 3 (THREE) TIMES DAILY. 11/25/18   Charlott Rakes, MD    Allergies    Patient has no known allergies.  Review of Systems   Review of Systems  Constitutional: Negative for chills and fever.  HENT: Negative.   Respiratory: Negative.   Cardiovascular: Negative.   Gastrointestinal: Positive for abdominal pain, nausea and vomiting.  Musculoskeletal:  Negative.  Negative for myalgias.  Skin: Negative.   Neurological: Positive for weakness. Negative for syncope.    Physical Exam Updated Vital Signs BP (!) 143/107 (BP Location: Left Arm)   Pulse (!) 115   Temp 98.5 F (36.9 C) (Oral)   Resp 19   Ht 6' 4"  (1.93 m)   Wt (!) 152.9 kg   SpO2 97%   BMI 41.02 kg/m   Physical Exam Vitals and nursing note reviewed.  Constitutional:      Appearance: He is well-developed. He is ill-appearing. He is not toxic-appearing or diaphoretic.  HENT:     Head: Normocephalic.  Cardiovascular:     Rate and Rhythm: Normal rate and regular rhythm.  Pulmonary:     Effort: Pulmonary effort is normal.     Breath sounds: Normal breath sounds. No wheezing, rhonchi or rales.  Abdominal:     General: Bowel sounds are normal.     Palpations: Abdomen is soft.     Tenderness: There is abdominal tenderness (Upper abdomen). There is no guarding or rebound.     Comments: Actively vomiting.   Musculoskeletal:        General: Normal range of motion.     Cervical back: Normal range of motion and neck supple.  Skin:    General: Skin is warm and dry.  Neurological:     Mental Status: He is alert and oriented to person, place, and time.     ED Results / Procedures / Treatments   Labs (all labs ordered are listed, but only abnormal results are displayed) Labs Reviewed  URINALYSIS, ROUTINE W REFLEX MICROSCOPIC - Abnormal; Notable for the following components:      Result Value   Glucose, UA >=500 (*)    Hgb urine dipstick SMALL (*)    Protein, ur >=300 (*)    Bacteria, UA RARE (*)    All other components within normal limits  CBG MONITORING, ED - Abnormal; Notable for the following components:   Glucose-Capillary 202 (*)    All other components within normal limits  LIPASE, BLOOD  COMPREHENSIVE METABOLIC PANEL  CBC  BLOOD GAS, VENOUS    EKG None  Radiology No results found.  Procedures Procedures (including critical care  time)  Medications  Ordered in ED Medications  sodium chloride flush (NS) 0.9 % injection 3 mL (has no administration in time range)  sodium chloride 0.9 % bolus 1,000 mL (1,000 mLs Intravenous New Bag/Given 02/23/19 0144)  metoCLOPramide (REGLAN) injection 10 mg (10 mg Intravenous Given 02/23/19 0141)  diphenhydrAMINE (BENADRYL) injection 12.5 mg (12.5 mg Intravenous Given 02/23/19 0141)  HYDROmorphone (DILAUDID) injection 1 mg (1 mg Intravenous Given 02/23/19 0152)    ED Course  I have reviewed the triage vital signs and the nursing notes.  Pertinent labs & imaging results that were available during my care of the patient were reviewed by me and considered in my medical decision making (see chart for details).    MDM Rules/Calculators/A&P                      Patient to ED with abdominal pain, N, V similar to previous episodes gastroparesis that stared yesterday. No fever.   IVF's started. Patient is a T1DM. No evidence DKA currently. He has a mild AKI with Cr 2.55, no leukocytosis. UA without acetones, infection. Appears concentrated.   Will provide IVF's. He reports pain improved, nausea improved but persistent. Reports phenergan works better. Dose ordered. Will continue to observe.  He had continued to steadily improve over time. No further nausea. He is tolerating PO fluids with further vomiting or recurrent nausea. Pain is much better after 2nd dose pain medication.   He is felt appropriate for discharge home. Will call in Tramadol which is what he states controls his pain at home. He states he is ready for discharge.   Final Clinical Impression(s) / ED Diagnoses Final diagnoses:  None   1. Gastroparesis  Rx / DC Orders ED Discharge Orders    None       Charlann Lange, Hershal Coria 02/23/19 3744    Mesner, Corene Cornea, MD 02/24/19 [phone]

## 2019-02-23 NOTE — ED Triage Notes (Signed)
Pt reports vomiting that started yesterday with hx of gastroparesis.

## 2019-02-23 NOTE — ED Notes (Signed)
Pt waiting on ride at this time.  

## 2019-02-23 NOTE — ED Notes (Signed)
Pt waiting on ride

## 2019-02-24 ENCOUNTER — Encounter (HOSPITAL_COMMUNITY): Payer: Self-pay | Admitting: Family Medicine

## 2019-02-24 ENCOUNTER — Other Ambulatory Visit: Payer: Self-pay

## 2019-02-24 ENCOUNTER — Observation Stay (HOSPITAL_COMMUNITY): Payer: Medicare Other

## 2019-02-24 ENCOUNTER — Inpatient Hospital Stay (HOSPITAL_COMMUNITY)
Admission: EM | Admit: 2019-02-24 | Discharge: 2019-03-04 | DRG: 177 | Disposition: A | Discharge status: Home Health | Payer: Medicare Other | Attending: Internal Medicine | Admitting: Internal Medicine

## 2019-02-24 DIAGNOSIS — R509 Fever, unspecified: Secondary | ICD-10-CM

## 2019-02-24 DIAGNOSIS — J9601 Acute respiratory failure with hypoxia: Secondary | ICD-10-CM

## 2019-02-24 DIAGNOSIS — R9389 Abnormal findings on diagnostic imaging of other specified body structures: Secondary | ICD-10-CM

## 2019-02-24 DIAGNOSIS — E1065 Type 1 diabetes mellitus with hyperglycemia: Secondary | ICD-10-CM | POA: Diagnosis present

## 2019-02-24 DIAGNOSIS — K3184 Gastroparesis: Secondary | ICD-10-CM | POA: Diagnosis not present

## 2019-02-24 DIAGNOSIS — F329 Major depressive disorder, single episode, unspecified: Secondary | ICD-10-CM | POA: Diagnosis present

## 2019-02-24 DIAGNOSIS — K219 Gastro-esophageal reflux disease without esophagitis: Secondary | ICD-10-CM | POA: Diagnosis present

## 2019-02-24 DIAGNOSIS — N19 Unspecified kidney failure: Secondary | ICD-10-CM

## 2019-02-24 DIAGNOSIS — E1143 Type 2 diabetes mellitus with diabetic autonomic (poly)neuropathy: Secondary | ICD-10-CM

## 2019-02-24 DIAGNOSIS — E1122 Type 2 diabetes mellitus with diabetic chronic kidney disease: Secondary | ICD-10-CM | POA: Diagnosis present

## 2019-02-24 DIAGNOSIS — N183 Chronic kidney disease, stage 3 unspecified: Secondary | ICD-10-CM | POA: Diagnosis present

## 2019-02-24 DIAGNOSIS — I10 Essential (primary) hypertension: Secondary | ICD-10-CM | POA: Diagnosis present

## 2019-02-24 DIAGNOSIS — E1043 Type 1 diabetes mellitus with diabetic autonomic (poly)neuropathy: Secondary | ICD-10-CM

## 2019-02-24 DIAGNOSIS — N179 Acute kidney failure, unspecified: Secondary | ICD-10-CM | POA: Diagnosis present

## 2019-02-24 DIAGNOSIS — R0600 Dyspnea, unspecified: Secondary | ICD-10-CM

## 2019-02-24 DIAGNOSIS — U071 COVID-19: Secondary | ICD-10-CM | POA: Diagnosis not present

## 2019-02-24 DIAGNOSIS — R109 Unspecified abdominal pain: Secondary | ICD-10-CM

## 2019-02-24 DIAGNOSIS — IMO0002 Reserved for concepts with insufficient information to code with codable children: Secondary | ICD-10-CM | POA: Diagnosis present

## 2019-02-24 DIAGNOSIS — F32A Depression, unspecified: Secondary | ICD-10-CM | POA: Diagnosis present

## 2019-02-24 LAB — COMPREHENSIVE METABOLIC PANEL
ALT: 25 U/L (ref 0–44)
AST: 25 U/L (ref 15–41)
Albumin: 3.5 g/dL (ref 3.5–5.0)
Alkaline Phosphatase: 94 U/L (ref 38–126)
Anion gap: 14 (ref 5–15)
BUN: 32 mg/dL — ABNORMAL HIGH (ref 6–20)
CO2: 21 mmol/L — ABNORMAL LOW (ref 22–32)
Calcium: 8.8 mg/dL — ABNORMAL LOW (ref 8.9–10.3)
Chloride: 107 mmol/L (ref 98–111)
Creatinine, Ser: 2.67 mg/dL — ABNORMAL HIGH (ref 0.61–1.24)
GFR calc Af Amer: 33 mL/min — ABNORMAL LOW (ref >60)
GFR calc non Af Amer: 28 mL/min — ABNORMAL LOW (ref >60)
Glucose, Bld: 291 mg/dL — ABNORMAL HIGH (ref 70–99)
Potassium: 4.7 mmol/L (ref 3.5–5.1)
Sodium: 142 mmol/L (ref 135–145)
Total Bilirubin: 0.5 mg/dL (ref 0.3–1.2)
Total Protein: 7.8 g/dL (ref 6.5–8.1)

## 2019-02-24 LAB — URINALYSIS, ROUTINE W REFLEX MICROSCOPIC
Bacteria, UA: NONE SEEN
Bilirubin Urine: NEGATIVE
Glucose, UA: >=500 mg/dL — AB
Ketones, ur: 5 mg/dL — AB
Leukocytes,Ua: NEGATIVE
Nitrite: NEGATIVE
Protein, ur: >=300 mg/dL — AB
Specific Gravity, Urine: 1.021 (ref 1.005–1.030)
pH: 5 (ref 5.0–8.0)

## 2019-02-24 LAB — SARS CORONAVIRUS 2 (TAT 6-24 HRS): SARS Coronavirus 2: POSITIVE — AB

## 2019-02-24 LAB — CBC WITH DIFFERENTIAL/PLATELET
Abs Immature Granulocytes: 0.05 10*3/uL (ref 0.00–0.07)
Basophils Absolute: 0 10*3/uL (ref 0.0–0.1)
Basophils Relative: 0 %
Eosinophils Absolute: 0 10*3/uL (ref 0.0–0.5)
Eosinophils Relative: 0 %
HCT: 44.7 % (ref 39.0–52.0)
Hemoglobin: 14 g/dL (ref 13.0–17.0)
Immature Granulocytes: 1 %
Lymphocytes Relative: 12 %
Lymphs Abs: 0.9 10*3/uL (ref 0.7–4.0)
MCH: 26.4 pg (ref 26.0–34.0)
MCHC: 31.3 g/dL (ref 30.0–36.0)
MCV: 84.3 fL (ref 80.0–100.0)
Monocytes Absolute: 0.5 10*3/uL (ref 0.1–1.0)
Monocytes Relative: 6 %
Neutro Abs: 6.2 10*3/uL (ref 1.7–7.7)
Neutrophils Relative %: 81 %
Platelets: 264 10*3/uL (ref 150–400)
RBC: 5.3 MIL/uL (ref 4.22–5.81)
RDW: 13 % (ref 11.5–15.5)
WBC: 7.6 10*3/uL (ref 4.0–10.5)
nRBC: 0 % (ref 0.0–0.2)

## 2019-02-24 LAB — LIPASE, BLOOD: Lipase: 22 U/L (ref 11–51)

## 2019-02-24 LAB — C-REACTIVE PROTEIN: CRP: 7.4 mg/dL — ABNORMAL HIGH (ref <1.0)

## 2019-02-24 LAB — CBG MONITORING, ED
Glucose-Capillary: 283 mg/dL — ABNORMAL HIGH (ref 70–99)
Glucose-Capillary: 217 mg/dL — ABNORMAL HIGH (ref 70–99)

## 2019-02-24 LAB — HEMOGLOBIN A1C
Hgb A1c MFr Bld: 11.2 % — ABNORMAL HIGH (ref 4.8–5.6)
Mean Plasma Glucose: 274.74 mg/dL

## 2019-02-24 LAB — GLUCOSE, CAPILLARY: Glucose-Capillary: 205 mg/dL — ABNORMAL HIGH (ref 70–99)

## 2019-02-24 LAB — D-DIMER, QUANTITATIVE: D-Dimer, Quant: 1.14 ug/mL-FEU — ABNORMAL HIGH (ref 0.00–0.50)

## 2019-02-24 LAB — FERRITIN: Ferritin: 259 ng/mL (ref 24–336)

## 2019-02-24 MED ORDER — ACETAMINOPHEN 325 MG PO TABS
650.0000 mg | ORAL_TABLET | Freq: Four times a day (QID) | ORAL | Status: DC | PRN
  Administered 2019-02-25 (×2): 650 mg via ORAL
  Filled 2019-02-24 (×5): qty 2

## 2019-02-24 MED ORDER — METOCLOPRAMIDE HCL 5 MG/ML IJ SOLN
10.0000 mg | Freq: Once | INTRAMUSCULAR | Status: AC
  Administered 2019-02-24: 10 mg via INTRAVENOUS
  Filled 2019-02-24: qty 2

## 2019-02-24 MED ORDER — GABAPENTIN 300 MG PO CAPS
300.0000 mg | ORAL_CAPSULE | Freq: Every day | ORAL | Status: DC
  Administered 2019-02-25 – 2019-03-03 (×7): 300 mg via ORAL
  Filled 2019-02-24 (×7): qty 1

## 2019-02-24 MED ORDER — LABETALOL HCL 5 MG/ML IV SOLN
20.0000 mg | INTRAVENOUS | Status: DC | PRN
  Administered 2019-02-25 – 2019-02-26 (×6): 20 mg via INTRAVENOUS
  Filled 2019-02-24 (×9): qty 4

## 2019-02-24 MED ORDER — SODIUM CHLORIDE 0.45 % IV SOLN: INTRAVENOUS | Status: AC

## 2019-02-24 MED ORDER — SODIUM CHLORIDE 0.9 % IV BOLUS
1000.0000 mL | Freq: Once | INTRAVENOUS | Status: AC
  Administered 2019-02-24: 1000 mL via INTRAVENOUS

## 2019-02-24 MED ORDER — PROMETHAZINE HCL 25 MG/ML IJ SOLN
25.0000 mg | INTRAMUSCULAR | Status: DC | PRN
  Administered 2019-02-24 – 2019-03-03 (×22): 25 mg via INTRAVENOUS
  Filled 2019-02-24 (×20): qty 1

## 2019-02-24 MED ORDER — INSULIN ASPART 100 UNIT/ML ~~LOC~~ SOLN
0.0000 [IU] | Freq: Three times a day (TID) | SUBCUTANEOUS | Status: DC
  Administered 2019-02-24 (×2): 5 [IU] via SUBCUTANEOUS
  Administered 2019-02-25: 15 [IU] via SUBCUTANEOUS
  Administered 2019-02-25: 5 [IU] via SUBCUTANEOUS
  Filled 2019-02-24: qty 0.15

## 2019-02-24 MED ORDER — ATORVASTATIN CALCIUM 20 MG PO TABS
20.0000 mg | ORAL_TABLET | Freq: Every day | ORAL | Status: DC
  Administered 2019-02-25 – 2019-03-04 (×6): 20 mg via ORAL
  Filled 2019-02-24 (×2): qty 2
  Filled 2019-02-24: qty 1
  Filled 2019-02-24: qty 2
  Filled 2019-02-24: qty 1
  Filled 2019-02-24: qty 2
  Filled 2019-02-24 (×3): qty 1

## 2019-02-24 MED ORDER — INSULIN GLARGINE 100 UNIT/ML ~~LOC~~ SOLN
20.0000 [IU] | Freq: Two times a day (BID) | SUBCUTANEOUS | Status: DC
  Administered 2019-02-24 – 2019-02-25 (×2): 20 [IU] via SUBCUTANEOUS
  Filled 2019-02-24 (×3): qty 0.2

## 2019-02-24 MED ORDER — PROMETHAZINE HCL 25 MG/ML IJ SOLN
12.5000 mg | Freq: Once | INTRAMUSCULAR | Status: AC
  Administered 2019-02-24: 12.5 mg via INTRAVENOUS
  Filled 2019-02-24: qty 1

## 2019-02-24 MED ORDER — DULOXETINE HCL 60 MG PO CPEP
60.0000 mg | ORAL_CAPSULE | Freq: Every day | ORAL | Status: DC
  Administered 2019-02-24 – 2019-03-04 (×7): 60 mg via ORAL
  Filled 2019-02-24 (×3): qty 2
  Filled 2019-02-24 (×2): qty 1
  Filled 2019-02-24 (×2): qty 2
  Filled 2019-02-24: qty 1

## 2019-02-24 MED ORDER — DILTIAZEM HCL 25 MG/5ML IV SOLN
20.0000 mg | Freq: Once | INTRAVENOUS | Status: AC
  Administered 2019-02-24: 20 mg via INTRAVENOUS
  Filled 2019-02-24: qty 5

## 2019-02-24 MED ORDER — MORPHINE SULFATE (PF) 4 MG/ML IV SOLN
4.0000 mg | Freq: Once | INTRAVENOUS | Status: AC
  Administered 2019-02-24: 4 mg via INTRAVENOUS
  Filled 2019-02-24: qty 1

## 2019-02-24 MED ORDER — SUCRALFATE 1 G PO TABS
1.0000 g | ORAL_TABLET | Freq: Four times a day (QID) | ORAL | Status: DC
  Administered 2019-02-24 – 2019-03-04 (×23): 1 g via ORAL
  Filled 2019-02-24 (×24): qty 1

## 2019-02-24 MED ORDER — SODIUM CHLORIDE 0.45 % IV SOLN: INTRAVENOUS | Status: DC

## 2019-02-24 MED ORDER — SODIUM CHLORIDE 0.9 % IV BOLUS
500.0000 mL | Freq: Once | INTRAVENOUS | Status: AC
  Administered 2019-02-24: 500 mL via INTRAVENOUS

## 2019-02-24 MED ORDER — ACETAMINOPHEN 650 MG RE SUPP: 650.0000 mg | Freq: Four times a day (QID) | RECTAL | Status: DC | PRN

## 2019-02-24 MED ORDER — METOCLOPRAMIDE HCL 5 MG/ML IJ SOLN
10.0000 mg | Freq: Four times a day (QID) | INTRAMUSCULAR | Status: DC
  Administered 2019-02-24 – 2019-03-04 (×32): 10 mg via INTRAVENOUS
  Filled 2019-02-24 (×32): qty 2

## 2019-02-24 MED ORDER — HALOPERIDOL LACTATE 5 MG/ML IJ SOLN
2.0000 mg | Freq: Once | INTRAMUSCULAR | Status: AC
  Administered 2019-02-24: 2 mg via INTRAVENOUS
  Filled 2019-02-24: qty 1

## 2019-02-24 MED ORDER — DILTIAZEM HCL ER COATED BEADS 180 MG PO CP24
180.0000 mg | ORAL_CAPSULE | Freq: Every day | ORAL | Status: DC
  Administered 2019-02-24 – 2019-02-25 (×2): 180 mg via ORAL
  Filled 2019-02-24 (×4): qty 1

## 2019-02-24 MED ORDER — HYDROMORPHONE HCL 1 MG/ML IJ SOLN
1.0000 mg | INTRAMUSCULAR | Status: DC | PRN
  Administered 2019-02-24 – 2019-02-25 (×5): 1 mg via INTRAVENOUS
  Filled 2019-02-24 (×5): qty 1

## 2019-02-24 MED ORDER — PANTOPRAZOLE SODIUM 40 MG IV SOLR
40.0000 mg | INTRAVENOUS | Status: DC
  Administered 2019-02-24 – 2019-02-26 (×3): 40 mg via INTRAVENOUS
  Filled 2019-02-24 (×3): qty 40

## 2019-02-24 NOTE — ED Notes (Signed)
Pt had 146mL of vomit in emesis bag. Pt given new bag and triage RN [facility] made aware.

## 2019-02-24 NOTE — Progress Notes (Signed)
This RN got report from day shift RN that patient COVID 19 +, day shift RN notified Dr. Abundio Miu and got an order to transfer patient to [facility]. Room set up for COVID 19 precautions and waiting for bed available at [facility] to transfer pt as soon as possible.

## 2019-02-24 NOTE — Progress Notes (Signed)
MEWS Guidelines - (patients age 43 and over)  Red - At High Risk for Deterioration Yellow - At risk for Deterioration  1. Go to room and assess patient 2. Validate data. Is this patient's baseline? If data confirmed: 3. Is this an acute change? 4. Administer prn meds/treatments as ordered. 5. Note Sepsis score 6. Review goals of care 7. Notify Charge Nurse, RRT nurse and Provider. 8. Ask Provider to come to bedside.  9. Document patient condition/interventions/response. 10. Increase frequency of vital signs and focused assessments to at least q15 minutes x 4, then q30 minutes x2. - If stable, then q1h x3, then q4h x3 and then q8h or dept. routine. - If unstable, contact Provider & RRT nurse. Prepare for possible transfer. 11. Add entry in progress notes using the smart phrase ".MEWS". 1. Go to room and assess patient 2. Validate data. Is this patient's baseline? If data confirmed: 3. Is this an acute change? 4. Administer prn meds/treatments as ordered? 5. Note Sepsis score 6. Review goals of care 7. Notify Charge Nurse and Provider 8. Call RRT nurse as needed. 9. Document patient condition/interventions/response. 10. Increase frequency of vital signs and focused assessments to at least q2h x2. - If stable, then q4h x2 and then q8h or dept. routine. - If unstable, contact Provider & RRT nurse. Prepare for possible transfer. 11. Add entry in progress notes using the smart phrase ".MEWS".  Green - Likely stable [place] - Comfort Care Only  1. Continue routine/ordered monitoring.  2. Review goals of care. 1. Continue routine/ordered monitoring. 2. Review goals of care.     

## 2019-02-24 NOTE — ED Provider Notes (Signed)
[facility] DEPT Provider Note   CSN: [id number] Arrival date & time: 02/23/19  2357     History Chief Complaint  Patient presents with  . Gastroparesis    Duane [name] is a 44 y.o. male with history of type 1 diabetes, CKD, gastroparesis, GERD, hypertension, hyperlipidemia, obesity.  Patient presents today for recurrent nausea and vomiting.  He was seen in the ED the morning of 02/23/2019, symptoms at that time started the day before, lab work was overall reassuring, he was given IV fluids, Reglan and Phenergan, improvement and was discharged with return precautions and tramadol.  Patient reports that since discharge yesterday his symptoms have returned and worsened.  He reports constant nausea and vomiting with unknown number of episodes since yesterday, nonbloody/nonbilious.  He reports he is had some abdominal cramping soreness due to the amount of vomiting, he reports is usual for him and his gastroparesis.  He describes abdominal cramping as mild constant generalized nonradiating worsened with vomiting improved with rest.  He has attempted home medications without relief.  Denies fever/chills, headache, chest pain/shortness of breath, cough, hematemesis, diarrhea, blood in the stool or any additional concerns. HPI     Past Medical History:  Diagnosis Date  . Allergy   . Arthritis   . Chronic diarrhea   . Chronic kidney disease   . Depression   . Diabetes mellitus without complication [facility]) age 13   insulin requiring from the start  . Diabetic neuropathy ([place]) Dx [address]  . Diabetic proliferative retinopathy ([place]) 08/02/2017  . Gastritis   . Gastroparesis 2012   100% gastric retention on 08/2013 GES  . GERD (gastroesophageal reflux disease) Dx 2012  . Hx of adenomatous polyp of colon 10/05/2018  . Hyperlipidemia   . Hypertension Dx 2012  . Neuromuscular disorder ([place])   . Obesity     Patient Active Problem List   Diagnosis Date Noted  .  Hx of adenomatous polyp of colon 10/05/2018  . Upper respiratory infection with cough and congestion 03/21/2018  . Respiratory infection 03/21/2018  . Skin ulcer of right foot, limited to breakdown of skin ([place]) 03/21/2018  . Obesity 01/02/2018  . Gastroparesis 12/21/2017  . CKD stage 3 secondary to diabetes ([place]) 12/21/2017  . Depression 11/20/2017  . Diabetic enteropathy ([place]) likely 10/11/2017  . Diabetic proliferative retinopathy ([place]) 08/02/2017  . Insomnia 05/17/2017  . Pedal edema 10/16/2016  . Chronic kidney disease 10/16/2016  . Vitamin D deficiency 08/21/2016  . Leg swelling 08/21/2016  . Excessive sweating 08/21/2016  . Status post cholecystectomy 10/16/2015  . Onychomycosis of toenail 04/22/2015  . Erectile dysfunction 04/22/2015  . Diabetic gastroparesis associated with type 1 diabetes mellitus ([place]) 01/01/2015  . Skin tag 09/17/2014  . Pain and swelling of left knee 09/13/2014  . Chronic diarrhea 09/13/2014  . Benign paroxysmal positional vertigo 07/15/2014  . MDD (major depressive disorder), single episode, severe , no psychosis ([place]) 05/19/2014  . GERD (gastroesophageal reflux disease) 05/05/2014  . Type I diabetes mellitus with complication, uncontrolled ([place])   . Diabetic neuropathy ([place]) 08/28/2013  . Abdominal pain 02/09/2013  . DM type 1 (diabetes mellitus, type 1) ([place]) 01/09/2013  . HTN (hypertension) 01/09/2013    Past Surgical History:  Procedure Laterality Date  . CHOLECYSTECTOMY  2013  . ESOPHAGOGASTRODUODENOSCOPY N/A 06/11/2013   Procedure: ESOPHAGOGASTRODUODENOSCOPY (EGD);  Surgeon: [name], MD;  Location: [facility];  Service: Endoscopy;  Laterality: N/A;  . EYE SURGERY Left   . FLEXIBLE SIGMOIDOSCOPY N/A  06/11/2013   Procedure: FLEXIBLE SIGMOIDOSCOPY;  Surgeon: [name], MD;  Location: [facility];  Service: Endoscopy;  Laterality: N/A;  . KNEE SURGERY Left        Family History  Problem Relation Age of Onset  . Diabetes  Father   . Diabetes Maternal Grandmother   . Diabetes Paternal Grandmother   . Colon cancer Cousin   . Pancreatic cancer Paternal Grandfather   . Lung cancer Paternal Grandfather        mets from pancreas  . Stomach cancer Neg Hx   . Rectal cancer Neg Hx   . Esophageal cancer Neg Hx     Social History   Tobacco Use  . Smoking status: Never Smoker  . Smokeless tobacco: Never Used  Substance Use Topics  . Alcohol use: No  . Drug use: No    Home Medications Prior to Admission medications   Medication Sig Start Date End Date Taking? Authorizing Provider  atorvastatin (LIPITOR) 20 MG tablet Take 1 tablet (20 mg total) by mouth daily. 10/21/18   Charlott Rakes, MD  Blood Glucose Monitoring Suppl (TRUE METRIX METER) W/DEVICE KIT 1 each by Does not apply route 4 (four) times daily -  before meals and at bedtime. 11/05/14   Funches, Adriana Mccallum, MD  diltiazem (CARTIA XT) 180 MG 24 hr capsule Take 1 capsule (180 mg total) by mouth daily. 10/21/18   Charlott Rakes, MD  DULoxetine (CYMBALTA) 60 MG capsule Take 1 capsule (60 mg total) by mouth daily. 10/21/18   Charlott Rakes, MD  furosemide (LASIX) 40 MG tablet Take 1.5 tablets (60 mg total) by mouth 2 (two) times daily. 06/18/18   Charlott Rakes, MD  gabapentin (NEURONTIN) 300 MG capsule Take 1 capsule (300 mg total) by mouth at bedtime. 10/21/18   Charlott Rakes, MD  glucose blood (CONTOUR NEXT TEST) test strip Use as instructed 10/11/17   Elayne Snare, MD  glucose blood (CONTOUR NEXT TEST) test strip Use as instructed 03/21/18   Kerin Perna, NP  glucose blood (TRUE METRIX BLOOD GLUCOSE TEST) test strip 1 each by Other route 3 (three) times daily. 08/20/16   Funches, Adriana Mccallum, MD  hyoscyamine (LEVSIN) 0.125 MG tablet TAKE 1 TABLET (0.125 MG TOTAL) BY MOUTH 3 (THREE) TIMES DAILY WITH MEALS AS NEEDED. 07/28/18   Esterwood, Amy S, PA-C  Insulin Glargine (LANTUS SOLOSTAR) 100 UNIT/ML Solostar Pen Inject 40 Units into the skin 2 (two) times daily. 10/21/18    Charlott Rakes, MD  insulin lispro (HUMALOG KWIKPEN) 100 UNIT/ML KwikPen INJECT 15 UNITS INTO THE SKIN 3 TIMES DAILY. 10/21/18   Charlott Rakes, MD  Insulin Pen Needle 31G X 8 MM MISC Inject 1 each into the skin 4 (four) times daily. Check blood sugar TID and QHS 06/15/16   Funches, Josalyn, MD  lisinopril (ZESTRIL) 40 MG tablet Take 1 tablet (40 mg total) by mouth daily. 10/21/18   Charlott Rakes, MD  methocarbamol (ROBAXIN) 500 MG tablet Take 1 tablet (500 mg total) by mouth every 8 (eight) hours as needed for muscle spasms. 01/19/19   Charlott Rakes, MD  metoCLOPramide (REGLAN) 10 MG tablet Take a half hour before meals 01/29/19   Esterwood, Amy S, PA-C  Pancrelipase, Lip-Prot-Amyl, (ZENPEP) 40000-126000 units CPEP Take 2 capsules by mouth 3 (three) times daily with meals. Take during meal 02/28/18   Esterwood, Amy S, PA-C  pantoprazole (PROTONIX) 40 MG tablet Take 1 tablet (40 mg total) by mouth 2 (two) times daily. 07/14/18   [place], Charlane Ferretti,  MD  pantoprazole (PROTONIX) 40 MG tablet Take 1 tablet (40 mg total) by mouth daily. 01/29/19   Esterwood, Amy S, PA-C  Peppermint Oil (IBGARD) 90 MG CPCR Take 2 capsules by mouth 3 (three) times daily as needed. Patient taking differently: Take 1 capsule by mouth 3 (three) times daily.  10/11/17   [name], MD  potassium chloride (MICRO-K) 10 MEQ CR capsule Take 1 capsule (10 mEq total) by mouth daily. 04/07/18   Charlott Rakes, MD  promethazine (PHENERGAN) 25 MG tablet TAKE 1 TABLET BY MOUTH EVERY 8 HOURS AS NEEDED FOR NAUSEA OR VOMITING. 01/29/19   Esterwood, Amy S, PA-C  sucralfate (CARAFATE) 1 g tablet TAKE 1 TABLET (1 G TOTAL) BY MOUTH 4 (FOUR) TIMES DAILY. 11/06/18   Charlott Rakes, MD  terbinafine (LAMISIL) 250 MG tablet Take 1 tablet (250 mg total) by mouth daily. 11/17/18   Edrick Kins, DPM  traMADol (ULTRAM) 50 MG tablet Take 1 tablet (50 mg total) by mouth every 6 (six) hours as needed. 02/23/19   Charlann Lange, PA-C  TRUEPLUS LANCETS 28G MISC  1 each by Does not apply route 4 (four) times daily -  before meals and at bedtime. 11/04/14   Funches, Josalyn, MD  XIFAXAN 550 MG TABS tablet TAKE 1 TABLET (550 MG TOTAL) BY MOUTH 3 (THREE) TIMES DAILY. 11/25/18   Charlott Rakes, MD    Allergies    Patient has no known allergies.  Review of Systems   Review of Systems Ten systems are reviewed and are negative for acute change except as noted in the HPI  Physical Exam Updated Vital Signs BP (!) 192/101   Pulse (!) 109   Temp 98.4 F (36.9 C) (Oral)   Resp (!) 22   SpO2 97%   Physical Exam Constitutional:      General: He is not in acute distress.    Appearance: Normal appearance. He is well-developed. He is not ill-appearing or diaphoretic.  HENT:     Head: Normocephalic and atraumatic.     Right Ear: External ear normal.     Left Ear: External ear normal.     Nose: Nose normal.  Eyes:     General: Vision grossly intact. Gaze aligned appropriately.     Pupils: Pupils are equal, round, and reactive to light.  Neck:     Trachea: Trachea and phonation normal. No tracheal deviation.  Pulmonary:     Effort: Pulmonary effort is normal. No respiratory distress.  Abdominal:     General: There is no distension.     Palpations: Abdomen is soft.     Tenderness: There is abdominal tenderness. There is no guarding or rebound.     Comments: Mild generalized  Musculoskeletal:        General: Normal range of motion.     Cervical back: Normal range of motion.  Skin:    General: Skin is warm and dry.  Neurological:     Mental Status: He is alert.     GCS: GCS eye subscore is 4. GCS verbal subscore is 5. GCS motor subscore is 6.     Comments: Speech is clear and goal oriented, follows commands Major Cranial nerves without deficit, no facial droop Moves extremities without ataxia, coordination intact  Psychiatric:        Behavior: Behavior normal.     ED Results / Procedures / Treatments   Labs (all labs ordered are listed, but  only abnormal results are displayed) Labs Reviewed  COMPREHENSIVE METABOLIC PANEL - Abnormal; Notable for the following components:      Result Value   CO2 21 (*)    Glucose, Bld 291 (*)    BUN 32 (*)    Creatinine, Ser 2.67 (*)    Calcium 8.8 (*)    GFR calc non Af Amer 28 (*)    GFR calc Af Amer 33 (*)    All other components within normal limits  CBG MONITORING, ED - Abnormal; Notable for the following components:   Glucose-Capillary 283 (*)    All other components within normal limits  CBC WITH DIFFERENTIAL/PLATELET  LIPASE, BLOOD  URINALYSIS, ROUTINE W REFLEX MICROSCOPIC    EKG EKG Interpretation  Date/Time:  Tuesday February 24 2019 05:58:29 EST Ventricular Rate:  110 PR Interval:    QRS Duration: 91 QT Interval:  338 QTC Calculation: 458 R Axis:   84 Text Interpretation: Sinus tachycardia Ventricular premature complex No significant change since last tracing Confirmed by Orpah Greek [phone]) on 02/24/2019 6:02:27 AM   Radiology No results found.  Procedures Procedures (including critical care time)  Medications Ordered in ED Medications  sodium chloride 0.9 % bolus 500 mL (has no administration in time range)  promethazine (PHENERGAN) injection 12.5 mg (has no administration in time range)  sodium chloride 0.9 % bolus 1,000 mL (1,000 mLs Intravenous New Bag/Given (Non-Interop) 02/24/19 9150)  haloperidol lactate (HALDOL) injection 2 mg (2 mg Intravenous Given 02/24/19 0544)    ED Course  I have reviewed the triage vital signs and the nursing notes.  Pertinent labs & imaging results that were available during my care of the patient were reviewed by me and considered in my medical decision making (see chart for details).    MDM Rules/Calculators/A&P                      Chart review: Gastroenterology note Amy Esterwood; patient takes Reglan 10 mg 3 times daily for gastroparesis secondary to adult onset diabetes, also GERD stable on PPI, chronic  diarrhea possibly IBS or bacterial overgrowth, advised avoidance of lactose, continue Xifaxan. - On initial evaluation patient dry heaving, alert and oriented speaking full sentences, cranial nerves intact, no meningeal signs, mildly tachycardic, lungs clear, abdomen with generalized mild tenderness no rebound, guarding or peritoneal signs, neurovascular tact to all 4 extremities without evidence of DVT.  Patient has history of gastroparesis, denies marijuana use.  He is a type I diabetic, concern for possible DKA, fluid bolus given in addition to Haldol for nausea.  Blood work ordered. - 5:20 AM: Patient reassessed resting comfortably no acute distress, nursing staff attempting IV. - EKG: Sinus tachycardia Ventricular premature complex No significant change since last tracing Confirmed by Orpah Greek [phone]) on 02/24/2019 6:02:27 AM  CBC within normal limits CBG 283 Lipase within normal limits CMP creatinine 2.67, BUN 32, bicarb 21, anion gap within normal limits, BUN and creatinine are similar to yesterday however worsened from prior 8 months ago at 1.72/15 Urinalysis pending - 6:25 AM: Patient reassessed resting comfortably no acute distress, reports continued nausea with no improvement following Haldol.  He is requesting Phenergan which has been ordered.,  Additional fluid bolus ordered for elevated creatinine. - Care handoff given to [place] PA-C at shift change.  Plan of care at this time is to await remainder of labs, fluids bolus and reassess.  If not improved potential observation admission for nausea/vomiting.  Note: Portions of this report may have been transcribed  using voice recognition software. Every effort was made to ensure accuracy; however, inadvertent computerized transcription errors may still be present. Final Clinical Impression(s) / ED Diagnoses Final diagnoses:  None    Rx / DC Orders ED Discharge Orders    None       Gari Crown  02/24/19 4290    Orpah Greek, MD 02/24/19 [phone]

## 2019-02-24 NOTE — ED Provider Notes (Signed)
  Physical Exam  BP (!) 181/103   Pulse (!) 121   Temp 98.4 F (36.9 C) (Oral)   Resp (!) 23   SpO2 100%   Physical Exam  Gen: nontoxic Abd: Diffusely tender without focal tenderness.  No rigidity, guarding, distention.  Patient dry heaving in the room.  ED Course/Procedures     Procedures  MDM   Patient signed out to me by B. Athena Masse, PA-C.  Please see previous notes for further history.  In brief, patient presented for evaluation nausea, vomiting, abdominal pain.  Consistent with his history of gastroparesis.  He was seen yesterday for the same, improved with Phenergan but worsened once home.  Patient states there is nothing different during today's episode from his normal gastroparesis.  He denies fevers, chills.  He has received Haldol and fluids.  Phenergan about to be given.  Patient is requesting pain medicine.  Labs overall reassuring, no leukocytosis.  Slight dehydration.  Plan to reassess after Phenergan.  On reassessment, patient reports continued nausea and he is still having a lot of pain.  As he has had multiple rounds of antiemetics and this is his second visit in 2 days, I do not believe he is able to be discharged at this time.  Patient remains tachycardic, likely due to pain.  Discussed with patient, who is agreeable to admission.  Will give Reglan and morphine for symptom control.  Will call hospitalist for admission.  Case discussed with Dr. Olevia Bowens from [facility] hospitalist service, patient to be admitted.       Franchot Heidelberg, PA-C 02/24/19 1055    Hayden Rasmussen, MD 02/24/19 1736

## 2019-02-24 NOTE — H&P (Signed)
History and Physical    Duane [name] IHK:[id number] DOB: 1975-04-02 DOA: 02/24/2019  PCP: Charlott Rakes, MD   Patient coming from: Home.  I have personally briefly reviewed patient's old medical records in [place]  Chief Complaint: Abdominal pain and vomiting.  HPI: Duane [name] is a 44 y.o. male with medical history significant of allergy, osteoarthritis, chronic diarrhea, stage IIIb chronic kidney disease, depression, type 1 diabetes mellitus, diabetic neuropathy, diabetic retinopathy, history of gastritis, history of gastroparesis, GERD, colon polyps, hyperlipidemia, hypertension, morbid obesity who is returning to the emergency department for the second time in the last 2 days due to persistent nausea and multiple episodes of emesis (30-40 episodes per patient) associated with epigastric abdominal pain.  His last BM was loose yesterday afternoon, but he denies further loose stools BM.  No melena or hematochezia.  He denies dysuria, frequency or hematuria.  He denies fever, chills, sore throat, rhinorrhea, dyspnea, wheezing or hemoptysis.  No chest pain, palpitations, diaphoresis, PND, orthopnea or pitting edema of the lower extremities.  He occasionally gets lightheaded.  No polyuria, polydipsia, polyphagia or blurred vision.  ED Course: Initial vital signs temperature 97.7 F, pulse 100, respiration 18, blood pressure 183/116 mmHg and O2 sat 99% on room air.  The patient received 1500 mL of NS bolus, morphine 4 mg IVP x1, metoclopramide 10 mg IVP x1 and haloperidol 2 mg IVP x1 about 4 hours prior to the metoclopramide.  His CBC shows a white count 7.6, hemoglobin 14.0 g/dL and platelets 264.  Yesterday morning the patient's hemoglobin was 12.9 g/dL, which demonstrate hemoconcentration.  CMP shows CO2 of 21 mmol/L.  The rest of the electrolytes are within normal limits when calcium is corrected to albumin.  LFTs are normal.  Glucose 291, BUN 32, creatinine 2.67 and calcium 8.8 mg/dL.   His baseline creatinine from 2019 was ranging from 1.49 to 1.85 mg/dL.  Review of Systems: As per HPI otherwise 10 point review of systems negative.   Past Medical History:  Diagnosis Date  . Allergy   . Arthritis   . Chronic diarrhea   . Chronic kidney disease   . Depression   . Diabetes mellitus without complication [facility]) age 61   insulin requiring from the start  . Diabetic neuropathy ([place]) Dx 20110  . Diabetic proliferative retinopathy ([place]) 08/02/2017  . Gastritis   . Gastroparesis 2012   100% gastric retention on 08/2013 GES  . GERD (gastroesophageal reflux disease) Dx 2012  . Hx of adenomatous polyp of colon 10/05/2018  . Hyperlipidemia   . Hypertension Dx 2012  . Neuromuscular disorder ([place])   . Obesity     Past Surgical History:  Procedure Laterality Date  . CHOLECYSTECTOMY  2013  . ESOPHAGOGASTRODUODENOSCOPY N/A 06/11/2013   Procedure: ESOPHAGOGASTRODUODENOSCOPY (EGD);  Surgeon: [name], MD;  Location: [facility];  Service: Endoscopy;  Laterality: N/A;  . EYE SURGERY Left   . FLEXIBLE SIGMOIDOSCOPY N/A 06/11/2013   Procedure: FLEXIBLE SIGMOIDOSCOPY;  Surgeon: [name], MD;  Location: [facility]COPY;  Service: Endoscopy;  Laterality: N/A;  . KNEE SURGERY Left      reports that he has never smoked. He has never used smokeless tobacco. He reports that he does not drink alcohol or use drugs.  No Known Allergies  Family History  Problem Relation Age of Onset  . Diabetes Father   . Diabetes Maternal Grandmother   . Diabetes Paternal Grandmother   . Colon cancer Cousin   . Pancreatic cancer Paternal  Grandfather   . Lung cancer Paternal Grandfather        mets from pancreas  . Stomach cancer Neg Hx   . Rectal cancer Neg Hx   . Esophageal cancer Neg Hx    Prior to Admission medications   Medication Sig Start Date End Date Taking? Authorizing Provider  atorvastatin (LIPITOR) 20 MG tablet Take 1 tablet (20 mg total) by mouth daily. 10/21/18  Yes Charlott Rakes, MD  diltiazem (CARTIA XT) 180 MG 24 hr capsule Take 1 capsule (180 mg total) by mouth daily. 10/21/18  Yes Charlott Rakes, MD  DULoxetine (CYMBALTA) 60 MG capsule Take 1 capsule (60 mg total) by mouth daily. 10/21/18  Yes Charlott Rakes, MD  furosemide (LASIX) 40 MG tablet Take 1.5 tablets (60 mg total) by mouth 2 (two) times daily. Patient taking differently: Take 40 mg by mouth 2 (two) times daily.  06/18/18  Yes Charlott Rakes, MD  gabapentin (NEURONTIN) 300 MG capsule Take 1 capsule (300 mg total) by mouth at bedtime. 10/21/18  Yes Charlott Rakes, MD  Insulin Glargine (LANTUS SOLOSTAR) 100 UNIT/ML Solostar Pen Inject 40 Units into the skin 2 (two) times daily. 10/21/18  Yes Newlin, Enobong, MD  insulin lispro (HUMALOG KWIKPEN) 100 UNIT/ML KwikPen INJECT 15 UNITS INTO THE SKIN 3 TIMES DAILY. Patient taking differently: Inject 15 Units into the skin 3 (three) times daily.  10/21/18  Yes Charlott Rakes, MD  lisinopril (ZESTRIL) 40 MG tablet Take 1 tablet (40 mg total) by mouth daily. 10/21/18  Yes Charlott Rakes, MD  methocarbamol (ROBAXIN) 500 MG tablet Take 1 tablet (500 mg total) by mouth every 8 (eight) hours as needed for muscle spasms. 01/19/19  Yes Charlott Rakes, MD  metoCLOPramide (REGLAN) 10 MG tablet Take a half hour before meals Patient taking differently: Take 10 mg by mouth 3 (three) times daily before meals.  01/29/19  Yes Esterwood, Amy S, PA-C  pantoprazole (PROTONIX) 40 MG tablet Take 1 tablet (40 mg total) by mouth daily. 01/29/19  Yes Esterwood, Amy S, PA-C  Peppermint Oil (IBGARD) 90 MG CPCR Take 2 capsules by mouth 3 (three) times daily as needed. Patient taking differently: Take 1 capsule by mouth daily as needed (IBD).  10/11/17  Yes [name], MD  potassium chloride (MICRO-K) 10 MEQ CR capsule Take 1 capsule (10 mEq total) by mouth daily. 04/07/18  Yes Newlin, Charlane Ferretti, MD  promethazine (PHENERGAN) 25 MG tablet TAKE 1 TABLET BY MOUTH EVERY 8 HOURS AS NEEDED FOR NAUSEA  OR VOMITING. Patient taking differently: Take 25 mg by mouth every 8 (eight) hours as needed for nausea or vomiting.  01/29/19  Yes Esterwood, Amy S, PA-C  sucralfate (CARAFATE) 1 g tablet TAKE 1 TABLET (1 G TOTAL) BY MOUTH 4 (FOUR) TIMES DAILY. 11/06/18  Yes Charlott Rakes, MD  terbinafine (LAMISIL) 250 MG tablet Take 1 tablet (250 mg total) by mouth daily. 11/17/18  Yes Evans, Brent M, DPM  XIFAXAN 550 MG TABS tablet TAKE 1 TABLET (550 MG TOTAL) BY MOUTH 3 (THREE) TIMES DAILY. Patient taking differently: Take 550 mg by mouth daily as needed (Bacterial infrction in stomach).  11/25/18  Yes Charlott Rakes, MD  Blood Glucose Monitoring Suppl (TRUE METRIX METER) W/DEVICE KIT 1 each by Does not apply route 4 (four) times daily -  before meals and at bedtime. 11/05/14   Funches, Adriana Mccallum, MD  glucose blood (CONTOUR NEXT TEST) test strip Use as instructed 10/11/17   Elayne Snare, MD  glucose blood (CONTOUR NEXT  TEST) test strip Use as instructed 03/21/18   Kerin Perna, NP  glucose blood (TRUE METRIX BLOOD GLUCOSE TEST) test strip 1 each by Other route 3 (three) times daily. 08/20/16   Funches, Adriana Mccallum, MD  hyoscyamine (LEVSIN) 0.125 MG tablet TAKE 1 TABLET (0.125 MG TOTAL) BY MOUTH 3 (THREE) TIMES DAILY WITH MEALS AS NEEDED. Patient not taking: Reported on 02/24/2019 07/28/18   Esterwood, Amy S, PA-C  Insulin Pen Needle 31G X 8 MM MISC Inject 1 each into the skin 4 (four) times daily. Check blood sugar TID and QHS 06/15/16   Funches, Josalyn, MD  Pancrelipase, Lip-Prot-Amyl, (ZENPEP) 40000-126000 units CPEP Take 2 capsules by mouth 3 (three) times daily with meals. Take during meal Patient not taking: Reported on 02/24/2019 02/28/18   Esterwood, Amy S, PA-C  pantoprazole (PROTONIX) 40 MG tablet Take 1 tablet (40 mg total) by mouth 2 (two) times daily. Patient not taking: Reported on 02/24/2019 07/14/18   Charlott Rakes, MD  traMADol (ULTRAM) 50 MG tablet Take 1 tablet (50 mg total) by mouth every 6 (six) hours  as needed. 02/23/19   Charlann Lange, PA-C  TRUEPLUS LANCETS 28G MISC 1 each by Does not apply route 4 (four) times daily -  before meals and at bedtime. 11/04/14   Boykin Nearing, MD    Physical Exam: Vitals:   02/24/19 0500 02/24/19 0610 02/24/19 0820 02/24/19 0850  BP: (!) 174/109 (!) 192/101 (!) 181/103   Pulse: (!) 115 (!) 109 (!) 115 (!) 121  Resp:  (!) 22 20 (!) 23  Temp:      TempSrc:      SpO2: 100% 97% 99% 100%    Constitutional: NAD, calm, comfortable Eyes: PERRL, lids and conjunctivae mildly injected. ENMT: Mucous membranes are dry. Posterior pharynx clear of any exudate or lesions.Normal dentition.  Neck: normal, supple, no masses, no thyromegaly Respiratory: clear to auscultation bilaterally, no wheezing, no crackles. Normal respiratory effort. No accessory muscle use.  Cardiovascular: Regular rate and rhythm, no murmurs / rubs / gallops. No extremity edema. 2+ pedal pulses. No carotid bruits.  Abdomen: Obese, nondistended.  Bowel sounds positive.  Soft, positive epigastric tenderness, no guarding, rebound or masses palpated. No hepatosplenomegaly.  Musculoskeletal: no clubbing / cyanosis. Good ROM, no contractures. Normal muscle tone.  Skin: no rashes, lesions, ulcers on limited dermatological examination. Neurologic: CN 2-12 grossly intact. Sensation intact, DTR normal. Strength 5/5 in all 4.  Psychiatric: Normal judgment and insight. Alert and oriented x 3. Normal mood.   Labs on Admission: I have personally reviewed following labs and imaging studies  CBC: Recent Labs  Lab 02/23/19 0144 02/24/19 0542  WBC 6.2 7.6  NEUTROABS  --  6.2  HGB 12.9* 14.0  HCT 42.3 44.7  MCV 85.5 84.3  PLT 225 914   Basic Metabolic Panel: Recent Labs  Lab 02/23/19 0144 02/24/19 0542  NA 140 142  K 4.5 4.7  CL 106 107  CO2 23 21*  GLUCOSE 253* 291*  BUN 24* 32*  CREATININE 2.55* 2.67*  CALCIUM 8.4* 8.8*   GFR: Estimated Creatinine Clearance: 57.7 mL/min (A) (by C-G  formula based on SCr of 2.67 mg/dL (H)). Liver Function Tests: Recent Labs  Lab 02/23/19 0144 02/24/19 0542  AST 24 25  ALT 23 25  ALKPHOS 83 94  BILITOT 0.5 0.5  PROT 7.1 7.8  ALBUMIN 3.1* 3.5   Recent Labs  Lab 02/23/19 0144 02/24/19 0542  LIPASE 19 22   No results for input(s): AMMONIA  in the last 168 hours. Coagulation Profile: No results for input(s): INR, PROTIME in the last 168 hours. Cardiac Enzymes: No results for input(s): CKTOTAL, CKMB, CKMBINDEX, TROPONINI in the last 168 hours. BNP (last 3 results) No results for input(s): PROBNP in the last 8760 hours. HbA1C: No results for input(s): HGBA1C in the last 72 hours. CBG: Recent Labs  Lab 02/23/19 0107 02/24/19 0534  GLUCAP 202* 283*   Lipid Profile: No results for input(s): CHOL, HDL, LDLCALC, TRIG, CHOLHDL, LDLDIRECT in the last 72 hours. Thyroid Function Tests: No results for input(s): TSH, T4TOTAL, FREET4, T3FREE, THYROIDAB in the last 72 hours. Anemia Panel: No results for input(s): VITAMINB12, FOLATE, FERRITIN, TIBC, IRON, RETICCTPCT in the last 72 hours. Urine analysis:    Component Value Date/Time   COLORURINE YELLOW 02/24/2019 0542   APPEARANCEUR CLEAR 02/24/2019 0542   LABSPEC 1.021 02/24/2019 0542   PHURINE 5.0 02/24/2019 0542   GLUCOSEU >=500 (A) 02/24/2019 0542   HGBUR MODERATE (A) 02/24/2019 0542   BILIRUBINUR NEGATIVE 02/24/2019 0542   BILIRUBINUR negative 06/01/2016 1059   KETONESUR 5 (A) 02/24/2019 0542   PROTEINUR >=300 (A) 02/24/2019 0542   UROBILINOGEN 0.2 06/01/2016 1059   UROBILINOGEN 0.2 06/27/2014 0748   NITRITE NEGATIVE 02/24/2019 0542   LEUKOCYTESUR NEGATIVE 02/24/2019 0542    Radiological Exams on Admission: No results found.   Echo 10/2018  IMPRESSIONS    1. The left ventricle has normal systolic function with an ejection fraction of 60-65%. The cavity size was normal. There is moderately increased left ventricular wall thickness. Left ventricular diastolic  Doppler parameters are consistent with impaired  relaxation.  2. Some flow acceleration around basal septum no SAM noted.  3. The right ventricle has normal systolic function. The cavity was normal. There is no increase in right ventricular wall thickness.  4. Left atrial size was moderately dilated.  5. Mild thickening of the mitral valve leaflet.  6. The aortic valve is tricuspid. Mild thickening of the aortic valve. Mild calcification of the aortic valve.  7. The aorta is normal unless otherwise noted.  EKG: Independently reviewed.  Vent. rate 110 BPM PR interval * ms QRS duration 91 ms QT/QTc 338/458 ms P-R-T axes 74 84 65 Sinus tachycardia Ventricular premature complex  Assessment/Plan Principal Problem:   Diabetic gastroparesis (HCC) Observation/telemetry. Keep n.p.o. for now. May has an ice chips. Trial of clear liquids once nausea resolves. Hydromorphone 1 mg every 3 hours as needed. Metoclopramide 10 mg IVP every 6 hours. Phenergan 25 mg IVP every 4 hours as needed. IV Protonix every 24 hours.  Active Problems:   Acute kidney injury ([place])   CKD stage 3 secondary to diabetes ([place]) Continue IV hydration. Hold furosemide and lisinopril. Monitor intake and output. Follow-up renal function electrolytes.    HTN (hypertension) Resume oral medications when tolerated. Hold ACE inhibitor and diuretic until AKI resolved. IV diltiazem and/or labetalol as needed.    Type I diabetes mellitus with complication, uncontrolled (HCC) Continue Lantus, but at a lower/basal dose. CBG monitoring with RI SS every 6 hours.    GERD (gastroesophageal reflux disease) Protonix 40 mg IVP every 24 hours.    Depression Resume Cymbalta once tolerating oral intake.     DVT prophylaxis: SCDs. Code Status: Full code. Family Communication:  Disposition Plan: Observation for IV hydration and symptoms treatment. Consults called: Admission status: Observation/telemetry.   Reubin Milan MD [facility]  If 7PM-7AM, please contact night-coverage [email or web address]  02/24/2019, 9:28 AM   This document  was prepared using Systems analyst and may contain some unintended transcription errors.

## 2019-02-24 NOTE — Significant Event (Signed)
Was notified by the patient's nurse that patient's Covid test came back positive.  Discussed with patient about his symptoms presently patient is asymptomatic with no shortness of breath or fever chills and patient is not hypoxic.  We will continue to observe.  Will check inflammatory markers and chest x-ray.  Airborne precautions for now.  Duane [name]

## 2019-02-24 NOTE — ED Triage Notes (Signed)
Patient is from home and transported via [facility]. Patient was diagnosed with gastroparesis yesterday. He continues to vomit and have a high blood sugar.

## 2019-02-25 ENCOUNTER — Observation Stay (HOSPITAL_COMMUNITY): Payer: Medicare Other

## 2019-02-25 DIAGNOSIS — I10 Essential (primary) hypertension: Secondary | ICD-10-CM

## 2019-02-25 DIAGNOSIS — E108 Type 1 diabetes mellitus with unspecified complications: Secondary | ICD-10-CM | POA: Diagnosis not present

## 2019-02-25 DIAGNOSIS — R112 Nausea with vomiting, unspecified: Secondary | ICD-10-CM | POA: Diagnosis not present

## 2019-02-25 DIAGNOSIS — Z794 Long term (current) use of insulin: Secondary | ICD-10-CM | POA: Diagnosis not present

## 2019-02-25 DIAGNOSIS — J9811 Atelectasis: Secondary | ICD-10-CM | POA: Diagnosis not present

## 2019-02-25 DIAGNOSIS — R0600 Dyspnea, unspecified: Secondary | ICD-10-CM | POA: Diagnosis not present

## 2019-02-25 DIAGNOSIS — J1282 Pneumonia due to coronavirus disease 2019: Secondary | ICD-10-CM

## 2019-02-25 DIAGNOSIS — E785 Hyperlipidemia, unspecified: Secondary | ICD-10-CM | POA: Diagnosis present

## 2019-02-25 DIAGNOSIS — J9601 Acute respiratory failure with hypoxia: Secondary | ICD-10-CM | POA: Diagnosis present

## 2019-02-25 DIAGNOSIS — N183 Chronic kidney disease, stage 3 unspecified: Secondary | ICD-10-CM | POA: Diagnosis not present

## 2019-02-25 DIAGNOSIS — E1022 Type 1 diabetes mellitus with diabetic chronic kidney disease: Secondary | ICD-10-CM | POA: Diagnosis present

## 2019-02-25 DIAGNOSIS — N179 Acute kidney failure, unspecified: Secondary | ICD-10-CM | POA: Diagnosis present

## 2019-02-25 DIAGNOSIS — K219 Gastro-esophageal reflux disease without esophagitis: Secondary | ICD-10-CM | POA: Diagnosis present

## 2019-02-25 DIAGNOSIS — E8770 Fluid overload, unspecified: Secondary | ICD-10-CM | POA: Diagnosis present

## 2019-02-25 DIAGNOSIS — E1122 Type 2 diabetes mellitus with diabetic chronic kidney disease: Secondary | ICD-10-CM

## 2019-02-25 DIAGNOSIS — K3184 Gastroparesis: Secondary | ICD-10-CM | POA: Diagnosis present

## 2019-02-25 DIAGNOSIS — Z9049 Acquired absence of other specified parts of digestive tract: Secondary | ICD-10-CM | POA: Diagnosis not present

## 2019-02-25 DIAGNOSIS — E875 Hyperkalemia: Secondary | ICD-10-CM | POA: Diagnosis present

## 2019-02-25 DIAGNOSIS — R109 Unspecified abdominal pain: Secondary | ICD-10-CM | POA: Diagnosis not present

## 2019-02-25 DIAGNOSIS — Z833 Family history of diabetes mellitus: Secondary | ICD-10-CM | POA: Diagnosis not present

## 2019-02-25 DIAGNOSIS — E1065 Type 1 diabetes mellitus with hyperglycemia: Secondary | ICD-10-CM

## 2019-02-25 DIAGNOSIS — J1289 Other viral pneumonia: Secondary | ICD-10-CM | POA: Diagnosis not present

## 2019-02-25 DIAGNOSIS — U071 COVID-19: Secondary | ICD-10-CM | POA: Diagnosis present

## 2019-02-25 DIAGNOSIS — E1043 Type 1 diabetes mellitus with diabetic autonomic (poly)neuropathy: Secondary | ICD-10-CM | POA: Diagnosis present

## 2019-02-25 DIAGNOSIS — E870 Hyperosmolality and hypernatremia: Secondary | ICD-10-CM | POA: Diagnosis present

## 2019-02-25 DIAGNOSIS — N19 Unspecified kidney failure: Secondary | ICD-10-CM | POA: Diagnosis not present

## 2019-02-25 DIAGNOSIS — R509 Fever, unspecified: Secondary | ICD-10-CM | POA: Diagnosis not present

## 2019-02-25 DIAGNOSIS — Z6841 Body Mass Index (BMI) 40.0 and over, adult: Secondary | ICD-10-CM | POA: Diagnosis not present

## 2019-02-25 DIAGNOSIS — N1832 Chronic kidney disease, stage 3b: Secondary | ICD-10-CM | POA: Diagnosis present

## 2019-02-25 DIAGNOSIS — I160 Hypertensive urgency: Secondary | ICD-10-CM | POA: Diagnosis present

## 2019-02-25 DIAGNOSIS — F329 Major depressive disorder, single episode, unspecified: Secondary | ICD-10-CM | POA: Diagnosis present

## 2019-02-25 DIAGNOSIS — E1143 Type 2 diabetes mellitus with diabetic autonomic (poly)neuropathy: Secondary | ICD-10-CM | POA: Diagnosis not present

## 2019-02-25 DIAGNOSIS — Z765 Malingerer [conscious simulation]: Secondary | ICD-10-CM | POA: Diagnosis not present

## 2019-02-25 DIAGNOSIS — I129 Hypertensive chronic kidney disease with stage 1 through stage 4 chronic kidney disease, or unspecified chronic kidney disease: Secondary | ICD-10-CM | POA: Diagnosis present

## 2019-02-25 DIAGNOSIS — E1040 Type 1 diabetes mellitus with diabetic neuropathy, unspecified: Secondary | ICD-10-CM | POA: Diagnosis present

## 2019-02-25 LAB — HIV ANTIBODY (ROUTINE TESTING W REFLEX): HIV Screen 4th Generation wRfx: NONREACTIVE

## 2019-02-25 LAB — CBC
HCT: 41.1 % (ref 39.0–52.0)
Hemoglobin: 12.4 g/dL — ABNORMAL LOW (ref 13.0–17.0)
MCH: 26.3 pg (ref 26.0–34.0)
MCHC: 30.2 g/dL (ref 30.0–36.0)
MCV: 87.1 fL (ref 80.0–100.0)
Platelets: 291 10*3/uL (ref 150–400)
RBC: 4.72 MIL/uL (ref 4.22–5.81)
RDW: 13.2 % (ref 11.5–15.5)
WBC: 9.2 10*3/uL (ref 4.0–10.5)
nRBC: 0 % (ref 0.0–0.2)

## 2019-02-25 LAB — GLUCOSE, CAPILLARY
Glucose-Capillary: 195 mg/dL — ABNORMAL HIGH (ref 70–99)
Glucose-Capillary: 222 mg/dL — ABNORMAL HIGH (ref 70–99)
Glucose-Capillary: 361 mg/dL — ABNORMAL HIGH (ref 70–99)
Glucose-Capillary: 345 mg/dL — ABNORMAL HIGH (ref 70–99)
Glucose-Capillary: 360 mg/dL — ABNORMAL HIGH (ref 70–99)

## 2019-02-25 LAB — COMPREHENSIVE METABOLIC PANEL
ALT: 21 U/L (ref 0–44)
AST: 31 U/L (ref 15–41)
Albumin: 2.7 g/dL — ABNORMAL LOW (ref 3.5–5.0)
Alkaline Phosphatase: 73 U/L (ref 38–126)
Anion gap: 9 (ref 5–15)
BUN: 35 mg/dL — ABNORMAL HIGH (ref 6–20)
CO2: 23 mmol/L (ref 22–32)
Calcium: 8 mg/dL — ABNORMAL LOW (ref 8.9–10.3)
Chloride: 112 mmol/L — ABNORMAL HIGH (ref 98–111)
Creatinine, Ser: 2.98 mg/dL — ABNORMAL HIGH (ref 0.61–1.24)
GFR calc Af Amer: 29 mL/min — ABNORMAL LOW (ref >60)
GFR calc non Af Amer: 25 mL/min — ABNORMAL LOW (ref >60)
Glucose, Bld: 211 mg/dL — ABNORMAL HIGH (ref 70–99)
Potassium: 5.7 mmol/L — ABNORMAL HIGH (ref 3.5–5.1)
Sodium: 144 mmol/L (ref 135–145)
Total Bilirubin: 1.1 mg/dL (ref 0.3–1.2)
Total Protein: 6.4 g/dL — ABNORMAL LOW (ref 6.5–8.1)

## 2019-02-25 LAB — ABO/RH: ABO/RH(D): B POS

## 2019-02-25 MED ORDER — INSULIN ASPART 100 UNIT/ML ~~LOC~~ SOLN: 0.0000 [IU] | Freq: Every day | SUBCUTANEOUS | Status: DC

## 2019-02-25 MED ORDER — INSULIN ASPART 100 UNIT/ML ~~LOC~~ SOLN: 0.0000 [IU] | Freq: Three times a day (TID) | SUBCUTANEOUS | Status: DC

## 2019-02-25 MED ORDER — SODIUM CHLORIDE 0.9 % IV SOLN
100.0000 mg | Freq: Every day | INTRAVENOUS | Status: AC
  Administered 2019-02-26 – 2019-03-01 (×4): 100 mg via INTRAVENOUS
  Filled 2019-02-25 (×4): qty 100

## 2019-02-25 MED ORDER — DEXAMETHASONE SODIUM PHOSPHATE 10 MG/ML IJ SOLN
6.0000 mg | INTRAMUSCULAR | Status: DC
  Administered 2019-02-25 – 2019-03-01 (×5): 6 mg via INTRAVENOUS
  Filled 2019-02-25 (×5): qty 1

## 2019-02-25 MED ORDER — INSULIN ASPART 100 UNIT/ML ~~LOC~~ SOLN
0.0000 [IU] | Freq: Every day | SUBCUTANEOUS | Status: DC
  Administered 2019-02-25: 5 [IU] via SUBCUTANEOUS

## 2019-02-25 MED ORDER — SODIUM CHLORIDE 0.9 % IV BOLUS
500.0000 mL | Freq: Once | INTRAVENOUS | Status: AC
  Administered 2019-02-25: 500 mL via INTRAVENOUS

## 2019-02-25 MED ORDER — ENOXAPARIN SODIUM 80 MG/0.8ML ~~LOC~~ SOLN
75.0000 mg | SUBCUTANEOUS | Status: DC
  Administered 2019-02-25 – 2019-02-27 (×3): 75 mg via SUBCUTANEOUS
  Filled 2019-02-25 (×2): qty 0.75
  Filled 2019-02-25 (×2): qty 0.8

## 2019-02-25 MED ORDER — SODIUM CHLORIDE 0.9 % IV SOLN: INTRAVENOUS | Status: DC

## 2019-02-25 MED ORDER — INSULIN GLARGINE 100 UNIT/ML ~~LOC~~ SOLN
40.0000 [IU] | Freq: Two times a day (BID) | SUBCUTANEOUS | Status: DC
  Administered 2019-02-25: 22:00:00 40 [IU] via SUBCUTANEOUS
  Filled 2019-02-25 (×2): qty 0.4

## 2019-02-25 MED ORDER — FENTANYL CITRATE (PF) 100 MCG/2ML IJ SOLN
12.5000 ug | Freq: Once | INTRAMUSCULAR | Status: AC
  Administered 2019-02-25: 22:00:00 12.5 ug via INTRAVENOUS
  Filled 2019-02-25: qty 2

## 2019-02-25 MED ORDER — TRAZODONE HCL 50 MG PO TABS
50.0000 mg | ORAL_TABLET | Freq: Every day | ORAL | Status: DC
  Administered 2019-02-26 – 2019-03-03 (×7): 50 mg via ORAL
  Filled 2019-02-25 (×7): qty 1

## 2019-02-25 MED ORDER — SODIUM CHLORIDE 0.9% IV SOLUTION: Freq: Once | INTRAVENOUS | Status: AC

## 2019-02-25 MED ORDER — SODIUM POLYSTYRENE SULFONATE 15 GM/60ML PO SUSP
15.0000 g | Freq: Once | ORAL | Status: AC
  Administered 2019-02-25: 15 g via ORAL
  Filled 2019-02-25: qty 60

## 2019-02-25 MED ORDER — INSULIN ASPART 100 UNIT/ML ~~LOC~~ SOLN
3.0000 [IU] | Freq: Three times a day (TID) | SUBCUTANEOUS | Status: DC
  Administered 2019-02-25: 3 [IU] via SUBCUTANEOUS

## 2019-02-25 MED ORDER — SODIUM CHLORIDE 0.9 % IV SOLN
200.0000 mg | Freq: Once | INTRAVENOUS | Status: AC
  Administered 2019-02-25: 12:00:00 200 mg via INTRAVENOUS
  Filled 2019-02-25: qty 200

## 2019-02-25 MED ORDER — INSULIN ASPART 100 UNIT/ML ~~LOC~~ SOLN
0.0000 [IU] | Freq: Three times a day (TID) | SUBCUTANEOUS | Status: DC
  Administered 2019-02-25: 11 [IU] via SUBCUTANEOUS

## 2019-02-25 MED ORDER — FENTANYL CITRATE (PF) 100 MCG/2ML IJ SOLN
12.5000 ug | Freq: Once | INTRAMUSCULAR | Status: AC
  Administered 2019-02-26: 01:00:00 12.5 ug via INTRAVENOUS
  Filled 2019-02-25: qty 2

## 2019-02-25 NOTE — Progress Notes (Signed)
approximally  2300 Pt transferred to room 1440 on [address]. Report given to [employer] RN

## 2019-02-25 NOTE — Progress Notes (Signed)
RN requested an order for CBG checks/Insulin every 4 hours while patient is NPO. (If appropriate).

## 2019-02-25 NOTE — Progress Notes (Signed)
Patient still nauseous, vomiting has ceased since administration of phenergen. Patient states abdominal pain is still 7/10 to 8/10.

## 2019-02-25 NOTE — Progress Notes (Signed)
ANTICOAGULATION CONSULT NOTE - Initial Consult  Pharmacy Consult for Lovenox Indication: VTE prophylaxis  No Known Allergies  Patient Measurements: Height: 6\' 4"  (193 cm) Weight: (!) 335 lb 1.6 oz (152 kg) IBW/kg (Calculated) : 86.8  Vital Signs: Temp: 100.5 F (38.1 C) (01/13 0818) Temp Source: Oral (01/13 0818) BP: 174/115 (01/13 0604) Pulse Rate: 108 (01/13 0604)  Labs: Recent Labs    02/23/19 0144 02/24/19 0542 02/25/19 0259  HGB 12.9* 14.0 12.4*  HCT 42.3 44.7 41.1  PLT 225 264 291  CREATININE 2.55* 2.67* 2.98*    Estimated Creatinine Clearance: 51.6 mL/min (A) (by C-G formula based on SCr of 2.98 mg/dL (H)).   Medical History: Past Medical History:  Diagnosis Date  . Allergy   . Arthritis   . Chronic diarrhea   . Chronic kidney disease   . Depression   . Diabetes mellitus without complication [facility]) age 14   insulin requiring from the start  . Diabetic neuropathy ([place]) Dx 20110  . Diabetic proliferative retinopathy ([place]) 08/02/2017  . Gastritis   . Gastroparesis 2012   100% gastric retention on 08/2013 GES  . GERD (gastroesophageal reflux disease) Dx 2012  . Hx of adenomatous polyp of colon 10/05/2018  . Hyperlipidemia   . Hypertension Dx 2012  . Neuromuscular disorder ([place])   . Obesity     Assessment: 44 yo obese male with hx of diabetes, gastroparesis admitted with abdominal pain & nausea.  Subsequently found to be COVID+. CBC- WNL, no bleeding noted.  Hx CKD- Scr elevated at 2.98, est CrCl >9ml/min.  Baseline DDimer 1.14.  Goal of Therapy:  Monitor platelets by anticoagulation protocol: Yes   Plan:  Lovenox 75mg  SQ Q24h F/U CBC, Scr, s/sx of bleeding  Netta Cedars, PharmD, BCPS 02/25/2019,8:23 AM

## 2019-02-25 NOTE — Progress Notes (Signed)
[place] again.  PCP was notified. Vitals were: 101.62F;HR115;RR20;173/96; 95% on 2 L/Min [place].

## 2019-02-25 NOTE — Progress Notes (Addendum)
[facility] PROGRESS NOTE    Progress Note  Saxton Chain  NFA:[id number] DOB: [birth year] DOA: 02/24/2019 PCP: Charlott Rakes, MD     Brief Narrative:   Riley Hallum is an 44 y.o. male past medical history significant for chronic diarrhea, kidney disease stage III, depression diabetes mellitus type 1 with peripheral neuropathy, diabetic gastroparesis and diabetic neuropathy, morbid obesity who returns to the emergency room for the second time in the last 2 days with multiple episodes of nausea vomiting about 30 and a 24-hour period, with mild epigastric pain.  ED was found with a pulse of 100 blood pressure 183/116  Assessment/Plan:   Acute respiratory failure with hypoxia secondary to pneumonia due to COVID-19: He is currently requiring 2 L of oxygen to keep saturations greater than 93%. His inflammatory markers are significantly elevated, he started developing a fever overnight. We will start on IV remdesivir and steroids.  I have personally reviewed his chest x-ray shows a new patchy bilateral infiltrate. He has agreed to convalescent plasma, we had a long discussion about IV Actemra. At this point he is only requiring 2 L of oxygen to keep saturations greater than 93%.  If his saturations deteriorate we have discussed gust the use of  Actemra and he has agreed. The treatment plan and use of medications and known side effects were discussed with patient/family, they were clearly explained that there is no proven definitive treatment for COVID-19 infection, any medications used here are based on published clinical articles/anecdotal data which are not peer-reviewed or randomized control trials.  Complete risks and long-term side effects are unknown, however in the best clinical judgment they seem to be of some clinical benefit rather than medical risks.  Patient/family agree with the treatment plan and want to receive the given medications.  Acute nausea and vomiting due to diabetes  gastroparesis: No further nausea will start on full liquid diet advance as tolerated. She was started on Reglan scheduled and Phenergan as needed.  Agree with IV Protonix. I will go ahead and discontinue narcotics, I explained to him that narcotic will make his gastroparesis worse.  Acute kidney injury on chronic kidney disease stage IIIb: With a baseline creatinine of 1.4-1.7. In the setting of diuretic and ACE inhibitor.  Diuretic and ACE inhibitor have been held. Despite being positive on 2 L his creatinine continues to climb up continue IV fluids for the next 24 hours. Recheck a basic metabolic panel in the morning.  Hyperkalemia: Potassium is trending up, will give oral kayexalate. Recheck basic metabolic panel in the morning.    Essential hypertension: ACE inhibitor and diuretic therapy have been held due to kidney injury. He was started on IV diltiazem and IV labetalol.  Diabetes mellitus type 1 uncontrolled: Increase low-dose Lantus plus sliding scale CBGs before meals and at bedtime.  Depression Resume Cymbalta.  DVT prophylaxis: lovenox Family Communication:none Disposition Plan/Barrier to D/C: unable to determine Code Status:     Code Status Orders  (From admission, onward)         Start     Ordered   02/24/19 0932  Full code  Continuous     02/24/19 0933        Code Status History    Date Active Date Inactive Code Status Order ID Comments User Context   12/21/2017 0857 12/24/2017 1906 Full Code [id number]  Shelly Coss, MD ED   10/16/2015 0142 10/17/2015 1904 Full Code [id number]  Norval Morton, MD ED   01/01/2015 [phone] 01/05/2015  2043 Full Code [id number]  Etta Quill, DO ED   06/27/2014 0910 07/01/2014 2217 Full Code [id number]  Thurnell Lose, MD Inpatient   05/11/2014 1116 05/24/2014 2005 Full Code [id number]  [name], MD Inpatient   05/05/2014 0545 05/06/2014 1856 Full Code [id number]  Ivor Costa, MD Inpatient   04/30/2014 0705 05/03/2014 1632  Full Code [id number]  Lavina Hamman, MD ED   02/16/2014 0528 02/18/2014 2031 Full Code [id number]  Etta Quill, DO ED   02/03/2014 1004 02/05/2014 2130 Full Code [id number]  Thurnell Lose, MD Inpatient   12/31/2013 0341 01/10/2014 1714 Full Code [id number]  Berle Mull, MD Inpatient   10/14/2013 1603 10/19/2013 1651 Full Code [id number]  [name], MD ED   09/22/2013 0302 10/06/2013 2017 Full Code [id number]  [place], [place], DO Inpatient   08/27/2013 1532 08/29/2013 1717 Full Code [id number]  Louellen Molder, MD Inpatient   08/16/2013 2146 08/19/2013 1929 Full Code [id number]  Theressa Millard, MD ED   06/04/2013 1750 06/12/2013 2019 Full Code [id number]  Eugenie Filler, MD Inpatient   05/30/2013 2223 06/02/2013 1749 Full Code [id number]  Merton Border, MD Inpatient   01/09/2013 0450 01/10/2013 0014 Full Code [id number]  Etta Quill, DO Inpatient   Advance Care Planning Activity        IV Access:    Peripheral IV   Procedures and diagnostic studies:   DG CHEST PORT 1 VIEW  Result Date: 02/25/2019 CLINICAL DATA:  Fever EXAM: PORTABLE CHEST 1 VIEW COMPARISON:  February 24, 2019 FINDINGS: The heart size and mediastinal contours are within normal limits. Mildly increased patchy airspace opacity seen at the right lung base. The visualized skeletal structures are unremarkable. IMPRESSION: Mildly increased patchy airspace opacity at the right lung base which could be due to atelectasis and/or early infectious etiology. Electronically Signed   By: Prudencio Pair M.D.   On: 02/25/2019 06:01   DG CHEST PORT 1 VIEW  Result Date: 02/24/2019 CLINICAL DATA:  44 year old male with positive COVID-19. EXAM: PORTABLE CHEST 1 VIEW COMPARISON:  Chest radiograph dated 03/21/2018. FINDINGS: Shallow inspiration. Minimal bibasilar streaky densities, likely atelectasis. Developing infiltrate is not excluded clinical correlation is recommended no lobar consolidation, pleural effusion or pneumothorax. Stable mild  cardiomegaly. No acute osseous pathology. IMPRESSION: Shallow inspiration with bibasilar atelectasis versus developing infiltrate. Electronically Signed   By: Anner Crete M.D.   On: 02/24/2019 20:51     Medical Consultants:    None.  Anti-Infectives:   None  Subjective:    [name] he relates his nausea is much improved compared to yesterday he would like to try a liquid diet.  Objective:    Vitals:   02/24/19 2350 02/25/19 0400 02/25/19 0604 02/25/19 0700  BP: (!) 151/96 (!) 173/96 (!) 174/115   Pulse: (!) 110 (!) 115 (!) 108   Resp: 20 20 20    Temp: 98.8 F (37.1 C) (!) 101.1 F (38.4 C)  (!) 101.6 F (38.7 C)  TempSrc: Oral Oral  Oral  SpO2: 93% 95% 98%   Weight:      Height:       SpO2: 98 % O2 Flow Rate (L/min): 2 L/min   Intake/Output Summary (Last 24 hours) at 02/25/2019 0719 Last data filed at 02/25/2019 0645 Gross per 24 hour  Intake 1548.74 ml  Output --  Net 1548.74 ml   Filed Weights   02/24/19 1737  Weight: (!) 152 kg    Exam: General exam: In no acute  distress. Respiratory system: Good air movement and clear to auscultation. Cardiovascular system: S1 & S2 heard, RRR. No JVD. Gastrointestinal system: Abdomen is nondistended, soft and nontender.  Central nervous system: Alert and oriented. No focal neurological deficits. Extremities: No pedal edema. Skin: No rashes, lesions or ulcers Psychiatry: Judgement and insight appear normal. Mood & affect appropriate.     Data Reviewed:    Labs: Basic Metabolic Panel: Recent Labs  Lab 02/23/19 0144 02/24/19 0542 02/25/19 0259  NA 140 142 144  K 4.5 4.7 5.7*  CL 106 107 112*  CO2 23 21* 23  GLUCOSE 253* 291* 211*  BUN 24* 32* 35*  CREATININE 2.55* 2.67* 2.98*  CALCIUM 8.4* 8.8* 8.0*   GFR Estimated Creatinine Clearance: 51.6 mL/min (A) (by C-G formula based on SCr of 2.98 mg/dL (H)). Liver Function Tests: Recent Labs  Lab 02/23/19 0144 02/24/19 0542 02/25/19 0259  AST  24 25 31   ALT 23 25 21   ALKPHOS 83 94 73  BILITOT 0.5 0.5 1.1  PROT 7.1 7.8 6.4*  ALBUMIN 3.1* 3.5 2.7*   Recent Labs  Lab 02/23/19 0144 02/24/19 0542  LIPASE 19 22   No results for input(s): AMMONIA in the last 168 hours. Coagulation profile No results for input(s): INR, PROTIME in the last 168 hours. COVID-19 Labs  Recent Labs    02/24/19 2019  DDIMER 1.14*  FERRITIN 259  CRP 7.4*    Lab Results  Component Value Date   SARSCOV2NAA POSITIVE (A) 02/24/2019    CBC: Recent Labs  Lab 02/23/19 0144 02/24/19 0542 02/25/19 0259  WBC 6.2 7.6 9.2  NEUTROABS  --  6.2  --   HGB 12.9* 14.0 12.4*  HCT 42.3 44.7 41.1  MCV 85.5 84.3 87.1  PLT 225 264 291   Cardiac Enzymes: No results for input(s): CKTOTAL, CKMB, CKMBINDEX, TROPONINI in the last 168 hours. BNP (last 3 results) No results for input(s): PROBNP in the last 8760 hours. CBG: Recent Labs  Lab 02/23/19 0107 02/24/19 0534 02/24/19 1251 02/24/19 1806 02/25/19 0354  GLUCAP 202* 283* 217* 205* 195*   D-Dimer: Recent Labs    02/24/19 2019  DDIMER 1.14*   Hgb A1c: Recent Labs    02/24/19 0542  HGBA1C 11.2*   Lipid Profile: No results for input(s): CHOL, HDL, LDLCALC, TRIG, CHOLHDL, LDLDIRECT in the last 72 hours. Thyroid function studies: No results for input(s): TSH, T4TOTAL, T3FREE, THYROIDAB in the last 72 hours.  Invalid input(s): FREET3 Anemia work up: Recent Labs    02/24/19 2019  FERRITIN 259   Sepsis Labs: Recent Labs  Lab 02/23/19 0144 02/24/19 0542 02/25/19 0259  WBC 6.2 7.6 9.2   Microbiology Recent Results (from the past 240 hour(s))  SARS CORONAVIRUS 2 (TAT 6-24 HRS) Nasopharyngeal Nasopharyngeal Swab     Status: Abnormal   Collection Time: 02/24/19  1:12 PM   Specimen: Nasopharyngeal Swab  Result Value Ref Range Status   SARS Coronavirus 2 POSITIVE (A) NEGATIVE Final    Comment: RESULT CALLED TO, READ BACK BY AND VERIFIED WITH: S.HOEFLER,RN [phone] [id number]  I.MANNING (NOTE) SARS-CoV-2 target nucleic acids are DETECTED. The SARS-CoV-2 RNA is generally detectable in upper and lower respiratory specimens during the acute phase of infection. Positive results are indicative of the presence of SARS-CoV-2 RNA. Clinical correlation with patient history and other diagnostic information is  necessary to determine patient infection status. Positive results do not rule out bacterial infection or co-infection with other viruses.  The expected result is Negative. Fact  Sheet for Patients: Sugar[employer] Fact Sheet for Healthcare Providers: https://www.woods-mathews.com/ This test is not yet approved or cleared by the [place] FDA and  has been authorized for detection and/or diagnosis of SARS-CoV-2 by FDA under an Emergency Use Authorization (EUA). This EUA will remain  in effect (meaning this test can be used) for th e duration of the COVID-19 declaration under Section 564(b)(1) of the Act, 21 U.S.C. section 360bbb-3(b)(1), unless the authorization is terminated or revoked sooner. Performed at [facility], [place] [address] [place], [place] [address]      Medications:    atorvastatin  20 mg Oral Daily   diltiazem  180 mg Oral Daily   DULoxetine  60 mg Oral Daily   gabapentin  300 mg Oral QHS   insulin aspart  0-15 Units Subcutaneous TID WC   insulin glargine  20 Units Subcutaneous BID   metoCLOPramide (REGLAN) injection  10 mg Intravenous Q6H   pantoprazole (PROTONIX) IV  40 mg Intravenous Q24H   sucralfate  1 g Oral QID   Continuous Infusions:    LOS: 0 days   Charlynne Cousins  [facility]  02/25/2019, 7:19 AM

## 2019-02-25 NOTE — Progress Notes (Signed)
MEWS/VS Documentation      02/25/2019 0604 02/25/2019 0700 02/25/2019 0818 02/25/2019 0826   MEWS Score:  2  3  2  2    MEWS Score Color:  Yellow  Yellow  Yellow  Yellow   Resp:  20  --  --  20   Pulse:  (!) 108  --  --  (!) 101   BP:  (!) 174/115  --  --  --   Temp:  --  (!) 101.6 F (38.7 C)  (!) 100.5 F (38.1 C)  --   O2 Device:  Nasal Cannula  --  --  Nasal Cannula   O2 Flow Rate (L/min):  2 L/min  --  --  2 L/min     Fever subsiding.  Will continue to monitor BP.  Has taken some AM meds.

## 2019-02-25 NOTE — Progress Notes (Signed)
    02/24/19 2320  Vitals  Temp 100.2 F (37.9 C)  Temp Source Oral  BP (!) 134/93  MAP (mmHg) 105  BP Location Right Arm  BP Method Automatic  Patient Position (if appropriate) Lying  Pulse Rate (!) 112  Resp 18  Oxygen Therapy  SpO2 94 %  O2 Device Room Air  MEWS Score  MEWS Temp 0  MEWS Systolic 0  MEWS Pulse 2  MEWS RR 0  MEWS LOC 0  MEWS Score 2  MEWS Score Color Yellow

## 2019-02-25 NOTE — Progress Notes (Signed)
Patient stating upper mid gastric pain is 7/10, MD paged as current order for Hydromorphone has been discontinued.  Per Ardith Dark MD, physician notes state that this patient's attending discussed discontinuation of narcotics because they will make gastroparesis pain worse over all.   Patient stated he was not aware of this, one time dose order received for fentanyl.   Education provided to patient on reason for discontinuation of narcotic pain medications.

## 2019-02-25 NOTE — Progress Notes (Signed)
RN agrees with previous RN's assessment. 

## 2019-02-26 ENCOUNTER — Other Ambulatory Visit (HOSPITAL_COMMUNITY): Payer: Self-pay

## 2019-02-26 ENCOUNTER — Inpatient Hospital Stay (HOSPITAL_COMMUNITY): Payer: Medicare Other

## 2019-02-26 ENCOUNTER — Ambulatory Visit (HOSPITAL_COMMUNITY): Payer: Medicare Other | Admitting: Psychiatry

## 2019-02-26 DIAGNOSIS — R112 Nausea with vomiting, unspecified: Secondary | ICD-10-CM

## 2019-02-26 DIAGNOSIS — N179 Acute kidney failure, unspecified: Secondary | ICD-10-CM

## 2019-02-26 DIAGNOSIS — K3184 Gastroparesis: Secondary | ICD-10-CM

## 2019-02-26 DIAGNOSIS — E1043 Type 1 diabetes mellitus with diabetic autonomic (poly)neuropathy: Secondary | ICD-10-CM

## 2019-02-26 LAB — GLUCOSE, CAPILLARY
Glucose-Capillary: 226 mg/dL — ABNORMAL HIGH (ref 70–99)
Glucose-Capillary: 189 mg/dL — ABNORMAL HIGH (ref 70–99)
Glucose-Capillary: 191 mg/dL — ABNORMAL HIGH (ref 70–99)
Glucose-Capillary: 181 mg/dL — ABNORMAL HIGH (ref 70–99)

## 2019-02-26 LAB — BASIC METABOLIC PANEL
Anion gap: 9 (ref 5–15)
BUN: 51 mg/dL — ABNORMAL HIGH (ref 6–20)
CO2: 23 mmol/L (ref 22–32)
Calcium: 7.8 mg/dL — ABNORMAL LOW (ref 8.9–10.3)
Chloride: 111 mmol/L (ref 98–111)
Creatinine, Ser: 3.77 mg/dL — ABNORMAL HIGH (ref 0.61–1.24)
GFR calc Af Amer: 22 mL/min — ABNORMAL LOW (ref >60)
GFR calc non Af Amer: 19 mL/min — ABNORMAL LOW (ref >60)
Glucose, Bld: 286 mg/dL — ABNORMAL HIGH (ref 70–99)
Potassium: 4.2 mmol/L (ref 3.5–5.1)
Sodium: 143 mmol/L (ref 135–145)

## 2019-02-26 LAB — BPAM FFP
Blood Product Expiration Date: 202101140841
ISSUE DATE / TIME: 202101131432
Unit Type and Rh: 7300

## 2019-02-26 LAB — PREPARE FRESH FROZEN PLASMA: Unit division: 0

## 2019-02-26 LAB — MRSA PCR SCREENING: MRSA by PCR: NEGATIVE

## 2019-02-26 MED ORDER — NICARDIPINE HCL IN NACL 20-0.86 MG/200ML-% IV SOLN
3.0000 mg/h | INTRAVENOUS | Status: DC
  Administered 2019-02-26 – 2019-02-27 (×3): 5 mg/h via INTRAVENOUS
  Administered 2019-02-27: 7.5 mg/h via INTRAVENOUS
  Filled 2019-02-26 (×9): qty 200

## 2019-02-26 MED ORDER — POLYETHYLENE GLYCOL 3350 17 G PO PACK: 17.0000 g | PACK | Freq: Two times a day (BID) | ORAL | Status: DC

## 2019-02-26 MED ORDER — PANTOPRAZOLE SODIUM 40 MG IV SOLR
40.0000 mg | Freq: Two times a day (BID) | INTRAVENOUS | Status: DC
  Administered 2019-02-26 – 2019-03-03 (×10): 40 mg via INTRAVENOUS
  Filled 2019-02-26 (×9): qty 40

## 2019-02-26 MED ORDER — LABETALOL HCL 5 MG/ML IV SOLN
20.0000 mg | INTRAVENOUS | Status: AC | PRN
  Administered 2019-02-26 (×4): 20 mg via INTRAVENOUS
  Filled 2019-02-26 (×4): qty 4

## 2019-02-26 MED ORDER — ISOSORB DINITRATE-HYDRALAZINE 20-37.5 MG PO TABS
1.0000 | ORAL_TABLET | Freq: Three times a day (TID) | ORAL | Status: DC
  Filled 2019-02-26 (×2): qty 1

## 2019-02-26 MED ORDER — CHLORPROMAZINE HCL 25 MG PO TABS
25.0000 mg | ORAL_TABLET | Freq: Once | ORAL | Status: AC
  Administered 2019-02-26: 23:00:00 25 mg via ORAL
  Filled 2019-02-26: qty 1

## 2019-02-26 MED ORDER — HYDRALAZINE HCL 20 MG/ML IJ SOLN
10.0000 mg | Freq: Once | INTRAMUSCULAR | Status: AC
  Administered 2019-02-26: 17:00:00 10 mg via INTRAVENOUS
  Filled 2019-02-26: qty 1

## 2019-02-26 MED ORDER — HYDRALAZINE HCL 20 MG/ML IJ SOLN
10.0000 mg | INTRAMUSCULAR | Status: DC | PRN
  Administered 2019-02-28 – 2019-03-03 (×7): 10 mg via INTRAVENOUS
  Filled 2019-02-26 (×7): qty 1

## 2019-02-26 MED ORDER — SODIUM CHLORIDE 0.9 % IV SOLN: INTRAVENOUS | Status: DC

## 2019-02-26 MED ORDER — SODIUM CHLORIDE 0.9 % IV SOLN: INTRAVENOUS | Status: AC

## 2019-02-26 MED ORDER — CHLORHEXIDINE GLUCONATE CLOTH 2 % EX PADS
6.0000 | MEDICATED_PAD | Freq: Every day | CUTANEOUS | Status: DC
  Administered 2019-02-27 – 2019-03-04 (×5): 6 via TOPICAL

## 2019-02-26 MED ORDER — ALUM & MAG HYDROXIDE-SIMETH 200-200-20 MG/5ML PO SUSP
30.0000 mL | ORAL | Status: DC | PRN
  Administered 2019-02-26: 30 mL via ORAL
  Filled 2019-02-26: qty 30

## 2019-02-26 MED ORDER — SODIUM CHLORIDE 0.9 % IV BOLUS
1000.0000 mL | Freq: Once | INTRAVENOUS | Status: AC
  Administered 2019-02-26: 10:00:00 1000 mL via INTRAVENOUS

## 2019-02-26 MED ORDER — HYDRALAZINE HCL 20 MG/ML IJ SOLN
10.0000 mg | Freq: Four times a day (QID) | INTRAMUSCULAR | Status: DC | PRN
  Administered 2019-02-26: 10 mg via INTRAVENOUS
  Filled 2019-02-26: qty 1

## 2019-02-26 MED ORDER — INSULIN ASPART 100 UNIT/ML ~~LOC~~ SOLN
0.0000 [IU] | SUBCUTANEOUS | Status: DC
  Administered 2019-02-26: 3 [IU] via SUBCUTANEOUS
  Administered 2019-02-26 – 2019-02-27 (×6): 2 [IU] via SUBCUTANEOUS
  Administered 2019-02-27: 17:00:00 1 [IU] via SUBCUTANEOUS
  Administered 2019-02-27 (×2): 2 [IU] via SUBCUTANEOUS
  Administered 2019-02-28 (×2): 3 [IU] via SUBCUTANEOUS
  Administered 2019-02-28 (×4): 2 [IU] via SUBCUTANEOUS
  Administered 2019-03-01: 20:00:00 5 [IU] via SUBCUTANEOUS
  Administered 2019-03-01 (×3): 2 [IU] via SUBCUTANEOUS
  Administered 2019-03-01: 1 [IU] via SUBCUTANEOUS
  Administered 2019-03-01: 2 [IU] via SUBCUTANEOUS
  Administered 2019-03-02: 14:00:00 1 [IU] via SUBCUTANEOUS
  Administered 2019-03-02: 2 [IU] via SUBCUTANEOUS
  Administered 2019-03-02: 3 [IU] via SUBCUTANEOUS
  Administered 2019-03-02: 5 [IU] via SUBCUTANEOUS
  Administered 2019-03-03: 1 [IU] via SUBCUTANEOUS
  Administered 2019-03-03: 18:00:00 2 [IU] via SUBCUTANEOUS
  Administered 2019-03-03: 13:00:00 1 [IU] via SUBCUTANEOUS
  Administered 2019-03-04 (×2): 2 [IU] via SUBCUTANEOUS
  Administered 2019-03-04: 09:00:00 1 [IU] via SUBCUTANEOUS
  Administered 2019-03-04: 13:00:00 2 [IU] via SUBCUTANEOUS

## 2019-02-26 MED ORDER — SODIUM CHLORIDE 0.9 % IV BOLUS: 1000.0000 mL | Freq: Once | INTRAVENOUS | Status: DC

## 2019-02-26 MED ORDER — INSULIN GLARGINE 100 UNIT/ML ~~LOC~~ SOLN
30.0000 [IU] | Freq: Two times a day (BID) | SUBCUTANEOUS | Status: DC
  Administered 2019-02-26 – 2019-03-04 (×13): 30 [IU] via SUBCUTANEOUS
  Filled 2019-02-26 (×15): qty 0.3

## 2019-02-26 MED ORDER — TRAMADOL HCL 50 MG PO TABS
50.0000 mg | ORAL_TABLET | Freq: Four times a day (QID) | ORAL | Status: DC | PRN
  Administered 2019-02-26 – 2019-03-03 (×7): 50 mg via ORAL
  Filled 2019-02-26 (×7): qty 1

## 2019-02-26 MED ORDER — SODIUM CHLORIDE 0.9 % IV BOLUS
1000.0000 mL | Freq: Once | INTRAVENOUS | Status: AC
  Administered 2019-02-26: 1000 mL via INTRAVENOUS

## 2019-02-26 NOTE — Progress Notes (Addendum)
[facility] PROGRESS NOTE    Progress Note  Duane [name]  LPF:[id number] DOB: Mar 21, 1975 DOA: 02/24/2019 PCP: Charlott Rakes, MD     Brief Narrative:   Duane [name] is an 44 y.o. male past medical history significant for chronic diarrhea, kidney disease stage III, depression diabetes mellitus type 1 with peripheral neuropathy, diabetic gastroparesis and diabetic neuropathy, morbid obesity who returns to the emergency room for the second time in the last 2 days with multiple episodes of nausea vomiting about 30 and a 24-hour period, with mild epigastric pain.  ED was found with a pulse of 100 blood pressure 183/116  Assessment/Plan:   Acute respiratory failure with hypoxia secondary to pneumonia due to COVID-19: Duane [name] is requiring 2 L of oxygen to keep saturation greater than 90%. His inflammatory markers are pending at the time of this dictation, she started developing fevers and requiring oxygen and he was started empirically on IV remdesivir and steroids and has now defervesced since then.  His chest x-ray did not showed new patchy bilateral infiltrates. He was given convalescent plasma on 02/25/2019. His oxygen saturations have remained stable, try to keep the patient prone for at least 16 hours a day, if not prone out of bed to chair.  Acute nausea and vomiting due to diabetes gastroparesis: As he had been almost 18 hours without vomiting, he was restarted on a diet, but his nausea and vomiting returned he required several doses of Phenergan, his narcotics were stopped, as narcotics would make his gastroparesis worse have explained this to him and he seems adamant relating that he needs his narcotics to make his nausea and abdominal pain better. We will place him back n.p.o. start him on IV fluids, continue scheduled Reglan and Protonix. We will give him 2 L bolus of normal saline and continue IV fluids  Acute kidney injury on chronic kidney disease stage IIIb: With a  baseline creatinine of 1.4-1.7. In the setting of diuretic and ACE inhibitor, continue to hold these, will give an additional 2 L bolus of normal saline as his creatinine continues to climb, will continue IV fluid infusion for the next 24 hours. Recheck basic metabolic panel in the morning this likely prerenal azotemia in the setting of diuretic and ACE inhibitor use. Follow strict I's and O's.  Narcotic seeking behavior: Patient was very adamant about needing IV narcotics to control his nausea and vomiting from his gastroparesis and his abdominal pain. I explained to him that narcotics will not make his nausea and vomiting worse. When I was in the room he was more like coughing and dry heaving and it was sputum that I was seeing in the back not actually vomit. Have told the nurse to pay close attention and document his vomiting episodes. He has been given Tylenol and tramadol for pain.  Hyperkalemia: Resolved with oral Kayexalate.  Essential hypertension/hypertensive urgency: Patient cannot tolerate oral medications, it has been hard to control his blood pressure with IV hydralazine IV labetalol as needed. We will start him on Cardene drip transfer him to the stepdown titrate for systolic blood pressure greater than 160 over the next 24 hours.  Diabetes mellitus type 1 uncontrolled: Increase low-dose Lantus plus sliding scale CBGs before meals and at bedtime.  Depression Resume Cymbalta.  DVT prophylaxis: lovenox Family Communication:none Disposition Plan/Barrier to D/C: unable to determine Code Status:     Code Status Orders  (From admission, onward)         Start  Ordered   02/24/19 0932  Full code  Continuous     02/24/19 0933        Code Status History    Date Active Date Inactive Code Status Order ID Comments User Context   12/21/2017 0857 12/24/2017 1906 Full Code [id number]  Shelly Coss, MD ED   10/16/2015 0142 10/17/2015 1904 Full Code [id number]  Norval Morton, MD ED   01/01/2015 0554 01/05/2015 2043 Full Code [id number]  Etta Quill, DO ED   06/27/2014 0910 07/01/2014 2217 Full Code [id number]  Thurnell Lose, MD Inpatient   05/11/2014 1116 05/24/2014 2005 Full Code [id number]  [name], MD Inpatient   05/05/2014 0545 05/06/2014 1856 Full Code [id number]  Ivor Costa, MD Inpatient   04/30/2014 0705 05/03/2014 1632 Full Code [id number]  Lavina Hamman, MD ED   02/16/2014 0528 02/18/2014 2031 Full Code [id number]  Etta Quill, DO ED   02/03/2014 1004 02/05/2014 2130 Full Code [id number]  Thurnell Lose, MD Inpatient   12/31/2013 0341 01/10/2014 1714 Full Code [id number]  Berle Mull, MD Inpatient   10/14/2013 1603 10/19/2013 1651 Full Code [id number]  [name], MD ED   09/22/2013 0302 10/06/2013 2017 Full Code [id number]  [place], DO Inpatient   08/27/2013 1532 08/29/2013 1717 Full Code [id number]  Louellen Molder, MD Inpatient   08/16/2013 2146 08/19/2013 1929 Full Code [id number]  Theressa Millard, MD ED   06/04/2013 1750 06/12/2013 2019 Full Code [id number]  Eugenie Filler, MD Inpatient   05/30/2013 2223 06/02/2013 1749 Full Code [id number]  Merton Border, MD Inpatient   01/09/2013 0450 01/10/2013 0014 Full Code [id number]  Etta Quill, DO Inpatient   Advance Care Planning Activity        IV Access:    Peripheral IV   Procedures and diagnostic studies:   DG CHEST PORT 1 VIEW  Result Date: 02/25/2019 CLINICAL DATA:  Fever EXAM: PORTABLE CHEST 1 VIEW COMPARISON:  February 24, 2019 FINDINGS: The heart size and mediastinal contours are within normal limits. Mildly increased patchy airspace opacity seen at the right lung base. The visualized skeletal structures are unremarkable. IMPRESSION: Mildly increased patchy airspace opacity at the right lung base which could be due to atelectasis and/or early infectious etiology. Electronically Signed   By: Prudencio Pair M.D.   On: 02/25/2019 06:01   DG CHEST PORT 1 VIEW  Result Date:  02/24/2019 CLINICAL DATA:  44 year old male with positive COVID-19. EXAM: PORTABLE CHEST 1 VIEW COMPARISON:  Chest radiograph dated 03/21/2018. FINDINGS: Shallow inspiration. Minimal bibasilar streaky densities, likely atelectasis. Developing infiltrate is not excluded clinical correlation is recommended no lobar consolidation, pleural effusion or pneumothorax. Stable mild cardiomegaly. No acute osseous pathology. IMPRESSION: Shallow inspiration with bibasilar atelectasis versus developing infiltrate. Electronically Signed   By: Anner Crete M.D.   On: 02/24/2019 20:51     Medical Consultants:    None.  Anti-Infectives:   None  Subjective:    Duane [name] relates he has ongoing vomiting, in the room he is coughing  Objective:    Vitals:   02/26/19 0217 02/26/19 0248 02/26/19 0319 02/26/19 0617  BP: (!) 169/94 (!) 163/91 (!) 144/82 (!) 162/92  Pulse: 84 83 79 92  Resp:    20  Temp:    99 F (37.2 C)  TempSrc:    Oral  SpO2:    93%  Weight:      Height:       SpO2: 93 % O2 Flow  Rate (L/min): 2 L/min   Intake/Output Summary (Last 24 hours) at 02/26/2019 0741 Last data filed at 02/26/2019 0600 Gross per 24 hour  Intake 3675.36 ml  Output 325 ml  Net 3350.36 ml   Filed Weights   02/24/19 1737  Weight: (!) 152 kg    Exam: General exam: In no acute distress. Respiratory system: Good air movement and clear to auscultation. Cardiovascular system: S1 & S2 heard, RRR. No JVD. Gastrointestinal system: Abdomen is nondistended, soft and nontender.  Central nervous system: Alert and oriented. No focal neurological deficits. Extremities: No pedal edema. Skin: No rashes, lesions or ulcers Psychiatry: Judgement and insight appear normal. Mood & affect appropriate.  Data Reviewed:    Labs: Basic Metabolic Panel: Recent Labs  Lab 02/23/19 0144 02/23/19 0144 02/24/19 0542 02/24/19 0542 02/25/19 0259 02/26/19 0340  NA 140  --  142  --  144 143  K 4.5   < > 4.7    < > 5.7* 4.2  CL 106  --  107  --  112* 111  CO2 23  --  21*  --  23 23  GLUCOSE 253*  --  291*  --  211* 286*  BUN 24*  --  32*  --  35* 51*  CREATININE 2.55*  --  2.67*  --  2.98* 3.77*  CALCIUM 8.4*  --  8.8*  --  8.0* 7.8*   < > = values in this interval not displayed.   GFR Estimated Creatinine Clearance: 40.8 mL/min (A) (by C-G formula based on SCr of 3.77 mg/dL (H)). Liver Function Tests: Recent Labs  Lab 02/23/19 0144 02/24/19 0542 02/25/19 0259  AST 24 25 31   ALT 23 25 21   ALKPHOS 83 94 73  BILITOT 0.5 0.5 1.1  PROT 7.1 7.8 6.4*  ALBUMIN 3.1* 3.5 2.7*   Recent Labs  Lab 02/23/19 0144 02/24/19 0542  LIPASE 19 22   No results for input(s): AMMONIA in the last 168 hours. Coagulation profile No results for input(s): INR, PROTIME in the last 168 hours. COVID-19 Labs  Recent Labs    02/24/19 2019  DDIMER 1.14*  FERRITIN 259  CRP 7.4*    Lab Results  Component Value Date   SARSCOV2NAA POSITIVE (A) 02/24/2019    CBC: Recent Labs  Lab 02/23/19 0144 02/24/19 0542 02/25/19 0259  WBC 6.2 7.6 9.2  NEUTROABS  --  6.2  --   HGB 12.9* 14.0 12.4*  HCT 42.3 44.7 41.1  MCV 85.5 84.3 87.1  PLT 225 264 291   Cardiac Enzymes: No results for input(s): CKTOTAL, CKMB, CKMBINDEX, TROPONINI in the last 168 hours. BNP (last 3 results) No results for input(s): PROBNP in the last 8760 hours. CBG: Recent Labs  Lab 02/25/19 0354 02/25/19 0817 02/25/19 1141 02/25/19 1622 02/25/19 2150  GLUCAP 195* 222* 361* 345* 360*   D-Dimer: Recent Labs    02/24/19 2019  DDIMER 1.14*   Hgb A1c: Recent Labs    02/24/19 0542  HGBA1C 11.2*   Lipid Profile: No results for input(s): CHOL, HDL, LDLCALC, TRIG, CHOLHDL, LDLDIRECT in the last 72 hours. Thyroid function studies: No results for input(s): TSH, T4TOTAL, T3FREE, THYROIDAB in the last 72 hours.  Invalid input(s): FREET3 Anemia work up: Recent Labs    02/24/19 2019  FERRITIN 259   Sepsis Labs: Recent  Labs  Lab 02/23/19 0144 02/24/19 0542 02/25/19 0259  WBC 6.2 7.6 9.2   Microbiology Recent Results (from the past 240 hour(s))  SARS CORONAVIRUS  2 (TAT 6-24 HRS) Nasopharyngeal Nasopharyngeal Swab     Status: Abnormal   Collection Time: 02/24/19  1:12 PM   Specimen: Nasopharyngeal Swab  Result Value Ref Range Status   SARS Coronavirus 2 POSITIVE (A) NEGATIVE Final    Comment: RESULT CALLED TO, READ BACK BY AND VERIFIED WITH: S.HOEFLER,RN [id number] I.MANNING (NOTE) SARS-CoV-2 target nucleic acids are DETECTED. The SARS-CoV-2 RNA is generally detectable in upper and lower respiratory specimens during the acute phase of infection. Positive results are indicative of the presence of SARS-CoV-2 RNA. Clinical correlation with patient history and other diagnostic information is  necessary to determine patient infection status. Positive results do not rule out bacterial infection or co-infection with other viruses.  The expected result is Negative. Fact Sheet for Patients: SugarRoll.be Fact Sheet for Healthcare Providers: https://www.woods-mathews.com/ This test is not yet approved or cleared by the [place] FDA and  has been authorized for detection and/or diagnosis of SARS-CoV-2 by FDA under an Emergency Use Authorization (EUA). This EUA will remain  in effect (meaning this test can be used) for th e duration of the COVID-19 declaration under Section 564(b)(1) of the Act, 21 U.S.C. section 360bbb-3(b)(1), unless the authorization is terminated or revoked sooner. Performed at [facility], [place] [address], [place], [place] [address]      Medications:   . atorvastatin  20 mg Oral Daily  . dexamethasone (DECADRON) injection  6 mg Intravenous Q24H  . diltiazem  180 mg Oral Daily  . DULoxetine  60 mg Oral Daily  . enoxaparin (LOVENOX) injection  75 mg Subcutaneous Q24H  . gabapentin  300 mg Oral QHS  . insulin aspart  0-15  Units Subcutaneous TID WC  . insulin aspart  0-5 Units Subcutaneous QHS  . insulin aspart  3 Units Subcutaneous TID WC  . insulin glargine  40 Units Subcutaneous BID  . metoCLOPramide (REGLAN) injection  10 mg Intravenous Q6H  . pantoprazole (PROTONIX) IV  40 mg Intravenous Q24H  . sucralfate  1 g Oral QID  . traZODone  50 mg Oral QHS   Continuous Infusions: . sodium chloride 75 mL/hr at 02/25/19 2311  . remdesivir 100 mg in NS 100 mL        LOS: 1 day   Charlynne Cousins  [facility]  02/26/2019, 7:41 AM

## 2019-02-26 NOTE — Progress Notes (Signed)
Patient vomited 58mL - phenergan given per current order. BP 162/92 - PRN Labetalol 20mg  IV Q2H given. Patient complaining of 7/10 pain, patient reminded that Narcotics make gastroparesis worse per attending physician note, and that there are no current orders for narcotic pain medication. PRN tylenol offered to patient, patient refused, and stated "Can't I even get morphine." This RN declined, and informed the patient that Morphine is also a narcotic.

## 2019-02-26 NOTE — Progress Notes (Signed)
Spoke with M. Sharlet Salina MD, about patients continued pain, nausea, and vomiting. Orders received for Fentanyl, trazadone, and RN orders entered for Maalox. Patient reporting no relief from these interventions.   BP reassessed after Lobetalol administration for SBP> 149. BP still elevated at 196/102. MD paged for further orders.

## 2019-02-26 NOTE — Progress Notes (Signed)
Patient's BP of 196/102 treated with order of Labetalol 20mg  IV Q41minutes PRN, x4 doses. All 4 doses received, resulting BP 144/82. Patient also received  Phenergen 0230, Reglan 0205, and Fentanyl 12.5mg  IV, trazadone 50mg  Po, and Maalox PO as ordered.

## 2019-02-27 ENCOUNTER — Inpatient Hospital Stay (HOSPITAL_COMMUNITY): Payer: Medicare Other

## 2019-02-27 ENCOUNTER — Inpatient Hospital Stay: Payer: Self-pay

## 2019-02-27 LAB — GLUCOSE, CAPILLARY
Glucose-Capillary: 151 mg/dL — ABNORMAL HIGH (ref 70–99)
Glucose-Capillary: 164 mg/dL — ABNORMAL HIGH (ref 70–99)
Glucose-Capillary: 170 mg/dL — ABNORMAL HIGH (ref 70–99)
Glucose-Capillary: 173 mg/dL — ABNORMAL HIGH (ref 70–99)
Glucose-Capillary: 174 mg/dL — ABNORMAL HIGH (ref 70–99)
Glucose-Capillary: 186 mg/dL — ABNORMAL HIGH (ref 70–99)
Glucose-Capillary: 212 mg/dL — ABNORMAL HIGH (ref 70–99)

## 2019-02-27 LAB — BASIC METABOLIC PANEL
Anion gap: 9 (ref 5–15)
Anion gap: 11 (ref 5–15)
BUN: 50 mg/dL — ABNORMAL HIGH (ref 6–20)
BUN: 53 mg/dL — ABNORMAL HIGH (ref 6–20)
CO2: 21 mmol/L — ABNORMAL LOW (ref 22–32)
CO2: 19 mmol/L — ABNORMAL LOW (ref 22–32)
Calcium: 7.7 mg/dL — ABNORMAL LOW (ref 8.9–10.3)
Calcium: 7.9 mg/dL — ABNORMAL LOW (ref 8.9–10.3)
Chloride: 119 mmol/L — ABNORMAL HIGH (ref 98–111)
Chloride: 120 mmol/L — ABNORMAL HIGH (ref 98–111)
Creatinine, Ser: 3.4 mg/dL — ABNORMAL HIGH (ref 0.61–1.24)
Creatinine, Ser: 3.59 mg/dL — ABNORMAL HIGH (ref 0.61–1.24)
GFR calc Af Amer: 24 mL/min — ABNORMAL LOW (ref >60)
GFR calc Af Amer: 23 mL/min — ABNORMAL LOW (ref >60)
GFR calc non Af Amer: 21 mL/min — ABNORMAL LOW (ref >60)
GFR calc non Af Amer: 20 mL/min — ABNORMAL LOW (ref >60)
Glucose, Bld: 184 mg/dL — ABNORMAL HIGH (ref 70–99)
Glucose, Bld: 202 mg/dL — ABNORMAL HIGH (ref 70–99)
Potassium: 4.4 mmol/L (ref 3.5–5.1)
Potassium: 4.3 mmol/L (ref 3.5–5.1)
Sodium: 149 mmol/L — ABNORMAL HIGH (ref 135–145)
Sodium: 150 mmol/L — ABNORMAL HIGH (ref 135–145)

## 2019-02-27 LAB — D-DIMER, QUANTITATIVE: D-Dimer, Quant: 1.84 ug/mL-FEU — ABNORMAL HIGH (ref 0.00–0.50)

## 2019-02-27 LAB — C-REACTIVE PROTEIN: CRP: 11.5 mg/dL — ABNORMAL HIGH (ref <1.0)

## 2019-02-27 MED ORDER — LABETALOL HCL 5 MG/ML IV SOLN
0.5000 mg/min | INTRAVENOUS | Status: DC
  Administered 2019-02-27 – 2019-02-28 (×2): 3 mg/min via INTRAVENOUS
  Filled 2019-02-27 (×3): qty 200

## 2019-02-27 MED ORDER — SODIUM CHLORIDE 0.9 % IV SOLN
250.0000 mg | Freq: Four times a day (QID) | INTRAVENOUS | Status: DC
  Administered 2019-02-27 – 2019-03-02 (×12): 250 mg via INTRAVENOUS
  Filled 2019-02-27 (×17): qty 5

## 2019-02-27 MED ORDER — SODIUM CHLORIDE 0.9 % IV SOLN
INTRAVENOUS | Status: DC | PRN
  Administered 2019-02-27: 14:00:00 250 mL via INTRAVENOUS

## 2019-02-27 MED ORDER — ACETAMINOPHEN 10 MG/ML IV SOLN
1000.0000 mg | Freq: Four times a day (QID) | INTRAVENOUS | Status: AC
  Administered 2019-02-27 – 2019-02-28 (×4): 1000 mg via INTRAVENOUS
  Filled 2019-02-27 (×4): qty 100

## 2019-02-27 MED ORDER — FUROSEMIDE 10 MG/ML IJ SOLN
40.0000 mg | Freq: Once | INTRAMUSCULAR | Status: AC
  Administered 2019-02-27: 40 mg via INTRAVENOUS
  Filled 2019-02-27: qty 4

## 2019-02-27 MED ORDER — DEXTROSE-NACL 5-0.45 % IV SOLN: INTRAVENOUS | Status: DC

## 2019-02-27 MED ORDER — LABETALOL HCL 5 MG/ML IV SOLN: 0.5000 mg/min | INTRAVENOUS | Status: DC

## 2019-02-27 MED ORDER — FUROSEMIDE 10 MG/ML IJ SOLN
60.0000 mg | Freq: Three times a day (TID) | INTRAMUSCULAR | Status: DC
  Administered 2019-02-27 – 2019-02-28 (×3): 60 mg via INTRAVENOUS
  Filled 2019-02-27 (×3): qty 6

## 2019-02-27 MED ORDER — LABETALOL HCL 5 MG/ML IV SOLN
0.5000 mg/min | INTRAVENOUS | Status: DC
  Administered 2019-02-27: 09:00:00 0.5 mg/min via INTRAVENOUS
  Administered 2019-02-27: 2.5 mg/min via INTRAVENOUS
  Administered 2019-02-28: 1 mg/min via INTRAVENOUS
  Filled 2019-02-27 (×3): qty 100

## 2019-02-27 MED ORDER — SODIUM CHLORIDE 0.9 % IV SOLN
INTRAVENOUS | Status: DC | PRN
  Administered 2019-02-27: 18:00:00 500 mL via INTRAVENOUS

## 2019-02-27 MED ORDER — DEXTROSE 5 % IV SOLN: INTRAVENOUS | Status: DC

## 2019-02-27 NOTE — Progress Notes (Addendum)
[facility] PROGRESS NOTE    Progress Note  Duane [name]  HTD:[id number] DOB: 06/08/1975 DOA: 02/24/2019 PCP: Charlott Rakes, MD     Brief Narrative:   Duane [name] is an 44 y.o. male past medical history significant for chronic diarrhea, kidney disease stage III, depression diabetes mellitus type 1 with peripheral neuropathy, diabetic gastroparesis and diabetic neuropathy, morbid obesity who returns to the emergency room for the second time in the last 2 days with multiple episodes of nausea vomiting about 30 and a 24-hour period, with mild epigastric pain.  ED was found with a pulse of 100 blood pressure 183/116  Assessment/Plan:   Acute respiratory failure with hypoxia secondary to pneumonia due to COVID-19: Duane [name] Still requiring 2 L of oxygen keep saturations greater than 90%. His inflammatory markers are worsening, but he has remained afebrile and saturation have remain stable. We will continue IV remdesivir and steroids.  He is status post convalescent plasma on 02/25/2019. Try to keep him prone for early 16 hours a day if not prone out of bed to chair. Chest x- ray did not showed any infiltrated compare to previous Insert picc line.  Acute nausea and vomiting due to diabetes gastroparesis: Currently n.p.o. as he started vomiting, continue scheduled Reglan and as needed Zofran. kvo iv fluids Cont reglan will add erythromycin IV.  New Hypertensive urgency/essential hypertension: Patient was not able to tolerate orals and his blood pressure continued to be significantly elevated so he was transferred to unit, and started on nicardipine, he is mildly tachycardic this morning, will transition him to IV labetalol for heart rate control and a goal blood pressure less than 160. Continue to titrate meds.  Acute kidney injury on chronic kidney disease stage IIIb: With a baseline creatinine of 1.4-1.7. In the setting of diuretic and ACE inhibitor use, these were held  on  admission he was aggressively fluid hydrated despite this his creatinine continue to rise so he was bolused aggressively his creatinine is stabilized this morning. Check a renal ultrasound. He is currently n.p.o., He is positive about 6 L.  KVO IV fluids., urine OUP has improved, will give single dose of lasix, monitor strict I's and O's and will follow closely.  Narcotic seeking behavior: He keeps insisting that he wants narcotics for his abdominal pain due to his gastroparesis I explained to him again that narcotics will make his nausea and vomiting worse. Try to avoid narcotics, continue Tylenol IV, and tramadol orally for pain  New mild hypernatremia: Likely hypervolemic we will start him on gentle IV Lasix.  Hyperkalemia: Resolved with oral Kayexalate.  Diabetes mellitus type 1 uncontrolled: Increase low-dose Lantus plus sliding scale CBGs before meals and at bedtime.  Depression Resume Cymbalta.  DVT prophylaxis: lovenox Family Communication:none Disposition Plan/Barrier to D/C: unable to determine Code Status:     Code Status Orders  (From admission, onward)         Start     Ordered   02/24/19 0932  Full code  Continuous     02/24/19 0933        Code Status History    Date Active Date Inactive Code Status Order ID Comments User Context   12/21/2017 0857 12/24/2017 1906 Full Code [id number]  Shelly Coss, MD ED   10/16/2015 0142 10/17/2015 1904 Full Code [id number]  Norval Morton, MD ED   01/01/2015 0554 01/05/2015 2043 Full Code [id number]  Etta Quill, DO ED   06/27/2014 0910 07/01/2014 2217 Full Code [id number]  Thurnell Lose, MD Inpatient   05/11/2014 1116 05/24/2014 2005 Full Code [id number]  [name], MD Inpatient   05/05/2014 0545 05/06/2014 1856 Full Code [id number]  Ivor Costa, MD Inpatient   04/30/2014 0705 05/03/2014 1632 Full Code [id number]  Lavina Hamman, MD ED   02/16/2014 0528 02/18/2014 2031 Full Code [id number]  Etta Quill, DO ED    02/03/2014 1004 02/05/2014 2130 Full Code [id number]  Thurnell Lose, MD Inpatient   12/31/2013 0341 01/10/2014 1714 Full Code [id number]  Berle Mull, MD Inpatient   10/14/2013 1603 10/19/2013 1651 Full Code [id number]  [name], MD ED   09/22/2013 0302 10/06/2013 2017 Full Code [id number]  Swayze, Ava, DO Inpatient   08/27/2013 1532 08/29/2013 1717 Full Code [id number]  Louellen Molder, MD Inpatient   08/16/2013 2146 08/19/2013 1929 Full Code [id number]  Theressa Millard, MD ED   06/04/2013 1750 06/12/2013 2019 Full Code [id number]  Eugenie Filler, MD Inpatient   05/30/2013 2223 06/02/2013 1749 Full Code [id number]  Merton Border, MD Inpatient   01/09/2013 0450 01/10/2013 0014 Full Code [id number]  Etta Quill, DO Inpatient   Advance Care Planning Activity        IV Access:    Peripheral IV   Procedures and diagnostic studies:   DG Abd 2 Views  Result Date: 02/26/2019 CLINICAL DATA:  44 year old male with abdominal pain. EXAM: ABDOMEN - 2 VIEW COMPARISON:  CT of the abdomen pelvis dated 06/29/2014 and abdominal radiograph dated 01/01/2015. FINDINGS: Evaluation is limited due to patient's body habitus. There is no bowel dilatation or evidence of obstruction. No free air identified. Right upper quadrant cholecystectomy clips. Bibasilar confluent and nodular densities may represent atelectasis or infiltrate. Clinical correlation is recommended. The osseous structures and soft tissues are grossly unremarkable. IMPRESSION: 1. No evidence of bowel obstruction. 2. Bibasilar atelectasis or infiltrate. Electronically Signed   By: Anner Crete M.D.   On: 02/26/2019 18:59     Medical Consultants:    None.  Anti-Infectives:   None  Subjective:    Duane [name] minimal vomiting, he relates he has no appetite in a bad mood  Objective:    Vitals:   02/27/19 0230 02/27/19 0400 02/27/19 0457 02/27/19 0459  BP: (!) 164/83 (!) 169/86  (!) 162/78  Pulse: (!) 104 (!) 107 (!) 108 (!) 107   Resp: (!) 21 17 18 19   Temp:      TempSrc:      SpO2: 95% 93% 93% 92%  Weight:   (!) 148.8 kg   Height:       SpO2: 92 % O2 Flow Rate (L/min): 2 L/min   Intake/Output Summary (Last 24 hours) at 02/27/2019 0752 Last data filed at 02/27/2019 0448 Gross per 24 hour  Intake 1806.27 ml  Output 1375 ml  Net 431.27 ml   Filed Weights   02/24/19 1737 02/27/19 0457  Weight: (!) 152 kg (!) 148.8 kg    Exam: General exam: In no acute distress. Respiratory system: Good air movement and clear to auscultation. Cardiovascular system: S1 & S2 heard, RRR. No JVD. Gastrointestinal system: Abdomen is nondistended, soft and nontender.  Central nervous system: Alert and oriented. No focal neurological deficits. Extremities: No pedal edema. Skin: No rashes, lesions or ulcers Psychiatry: Judgement and insight appear normal. Mood & affect appropriate.  Data Reviewed:    Labs: Basic Metabolic Panel: Recent Labs  Lab 02/23/19 0144 02/23/19 0144 02/24/19 1497 02/24/19 0263 02/25/19 0259 02/25/19 0259 02/26/19 0340 02/27/19  0217  NA 140  --  142  --  144  --  143 149*  K 4.5   < > 4.7   < > 5.7*   < > 4.2 4.4  CL 106  --  107  --  112*  --  111 119*  CO2 23  --  21*  --  23  --  23 21*  GLUCOSE 253*  --  291*  --  211*  --  286* 184*  BUN 24*  --  32*  --  35*  --  51* 50*  CREATININE 2.55*  --  2.67*  --  2.98*  --  3.77* 3.40*  CALCIUM 8.4*  --  8.8*  --  8.0*  --  7.8* 7.7*   < > = values in this interval not displayed.   GFR Estimated Creatinine Clearance: 44.7 mL/min (A) (by C-G formula based on SCr of 3.4 mg/dL (H)). Liver Function Tests: Recent Labs  Lab 02/23/19 0144 02/24/19 0542 02/25/19 0259  AST 24 25 31   ALT 23 25 21   ALKPHOS 83 94 73  BILITOT 0.5 0.5 1.1  PROT 7.1 7.8 6.4*  ALBUMIN 3.1* 3.5 2.7*   Recent Labs  Lab 02/23/19 0144 02/24/19 0542  LIPASE 19 22   No results for input(s): AMMONIA in the last 168 hours. Coagulation profile No results for  input(s): INR, PROTIME in the last 168 hours. COVID-19 Labs  Recent Labs    02/24/19 2019 02/27/19 0217  DDIMER 1.14* 1.84*  FERRITIN 259  --   CRP 7.4* 11.5*    Lab Results  Component Value Date   SARSCOV2NAA POSITIVE (A) 02/24/2019    CBC: Recent Labs  Lab 02/23/19 0144 02/24/19 0542 02/25/19 0259  WBC 6.2 7.6 9.2  NEUTROABS  --  6.2  --   HGB 12.9* 14.0 12.4*  HCT 42.3 44.7 41.1  MCV 85.5 84.3 87.1  PLT 225 264 291   Cardiac Enzymes: No results for input(s): CKTOTAL, CKMB, CKMBINDEX, TROPONINI in the last 168 hours. BNP (last 3 results) No results for input(s): PROBNP in the last 8760 hours. CBG: Recent Labs  Lab 02/26/19 1127 02/26/19 1618 02/26/19 1937 02/26/19 2312 02/27/19 0318  GLUCAP 189* 191* 181* 164* 151*   D-Dimer: Recent Labs    02/24/19 2019 02/27/19 0217  DDIMER 1.14* 1.84*   Hgb A1c: No results for input(s): HGBA1C in the last 72 hours. Lipid Profile: No results for input(s): CHOL, HDL, LDLCALC, TRIG, CHOLHDL, LDLDIRECT in the last 72 hours. Thyroid function studies: No results for input(s): TSH, T4TOTAL, T3FREE, THYROIDAB in the last 72 hours.  Invalid input(s): FREET3 Anemia work up: Recent Labs    02/24/19 2019  FERRITIN 259   Sepsis Labs: Recent Labs  Lab 02/23/19 0144 02/24/19 0542 02/25/19 0259  WBC 6.2 7.6 9.2   Microbiology Recent Results (from the past 240 hour(s))  SARS CORONAVIRUS 2 (TAT 6-24 HRS) Nasopharyngeal Nasopharyngeal Swab     Status: Abnormal   Collection Time: 02/24/19  1:12 PM   Specimen: Nasopharyngeal Swab  Result Value Ref Range Status   SARS Coronavirus 2 POSITIVE (A) NEGATIVE Final    Comment: RESULT CALLED TO, READ BACK BY AND VERIFIED WITH: S.HOEFLER,RN [id number] I.MANNING (NOTE) SARS-CoV-2 target nucleic acids are DETECTED. The SARS-CoV-2 RNA is generally detectable in upper and lower respiratory specimens during the acute phase of infection. Positive results are indicative of the  presence of SARS-CoV-2 RNA. Clinical correlation with patient history and other  diagnostic information is  necessary to determine patient infection status. Positive results do not rule out bacterial infection or co-infection with other viruses.  The expected result is Negative. Fact Sheet for Patients: SugarRoll.be Fact Sheet for Healthcare Providers: https://www.woods-mathews.com/ This test is not yet approved or cleared by the [place] FDA and  has been authorized for detection and/or diagnosis of SARS-CoV-2 by FDA under an Emergency Use Authorization (EUA). This EUA will remain  in effect (meaning this test can be used) for th e duration of the COVID-19 declaration under Section 564(b)(1) of the Act, 21 U.S.C. section 360bbb-3(b)(1), unless the authorization is terminated or revoked sooner. Performed at [facility], [place] [address], [place], [place] [address]   MRSA PCR Screening     Status: None   Collection Time: 02/26/19  6:41 PM   Specimen: Nasal Mucosa; Nasopharyngeal  Result Value Ref Range Status   MRSA by PCR NEGATIVE NEGATIVE Final    Comment:        The GeneXpert MRSA Assay (FDA approved for NASAL specimens only), is one component of a comprehensive MRSA colonization surveillance program. It is not intended to diagnose MRSA infection nor to guide or monitor treatment for MRSA infections. Performed at [facility], [place] [address], [place], [place] [address]      Medications:   . atorvastatin  20 mg Oral Daily  . Chlorhexidine Gluconate Cloth  6 each Topical Q0600  . dexamethasone (DECADRON) injection  6 mg Intravenous Q24H  . DULoxetine  60 mg Oral Daily  . enoxaparin (LOVENOX) injection  75 mg Subcutaneous Q24H  . gabapentin  300 mg Oral QHS  . insulin aspart  0-9 Units Subcutaneous Q4H  . insulin glargine  30 Units Subcutaneous BID  . metoCLOPramide (REGLAN) injection  10 mg  Intravenous Q6H  . pantoprazole (PROTONIX) IV  40 mg Intravenous Q12H  . sucralfate  1 g Oral QID  . traZODone  50 mg Oral QHS   Continuous Infusions: . dextrose    . niCARDipine 7.5 mg/hr (02/27/19 0630)  . remdesivir 100 mg in NS 100 mL Stopped (02/26/19 1136)      LOS: 2 days   Charlynne Cousins  [facility]  02/27/2019, 7:52 AM

## 2019-02-27 NOTE — Plan of Care (Signed)
  Problem: Education: Goal: Knowledge of General Education information will improve Description: Including pain rating scale, medication(s)/side effects and non-pharmacologic comfort measures Outcome: Progressing   Problem: Health Behavior/Discharge Planning: Goal: Ability to manage health-related needs will improve Outcome: Progressing   Problem: Clinical Measurements: Goal: Ability to maintain clinical measurements within normal limits will improve Outcome: Progressing Goal: Respiratory complications will improve Outcome: Progressing   Problem: Activity: Goal: Risk for activity intolerance will decrease Outcome: Progressing   Problem: Elimination: Goal: Will not experience complications related to bowel motility Outcome: Progressing   Problem: Skin Integrity: Goal: Risk for impaired skin integrity will decrease Outcome: Progressing

## 2019-02-27 NOTE — Progress Notes (Signed)
At bedside, patient states he is followed by [facility], Dr. Olevia Bowens notified. Will will continue to monitor.

## 2019-02-27 NOTE — Consult Note (Addendum)
Renal Service Consult Note [facility]  Duane [name] 02/27/2019 [name] Requesting Physician:  Dr Aileen Fass  Reason for Consult:  Acute on CKD III  HPI: The patient is a 44 y.o. year-old w/ hx HTN, DM2 w/ gastroparesis and CKD baseline creat 1.7- 2.4 (stage III) admitted 02/24/19 for abd pain w/ N/V. Long hx gastroparesis. +for COVID and had acute resp failure, is on 2L [place] now.  CXR was clear.  Had HTN urgency and creat elevated to 3.4 (baseline 1.8- 2's), we are asked to see for renal failure.   Pt seen in room, c/o nausea, no other c/o's.  No sob or cough, no Cp or abd pain or diarrhea.  Pt is + about 6 L, arms are swollen.   Creat 2.5 >> 3.5 since arrival.  Na 140 >. 150 now.  Has rec'd IVF's here w/ 1/2 NS , NS and d5 NS.    Renal US shows 11- 13 cm kidneys w/o hydro.  UA > 300 prot, otherwise unremarkable.    ROS  denies CP  no joint pain   no HA  no blurry vision  no rash  no diarrhea  no dysuria  no difficulty voiding  no change in urine color    Past Medical History  Past Medical History:  Diagnosis Date  . Allergy   . Arthritis   . Chronic diarrhea   . Chronic kidney disease   . Depression   . Diabetes mellitus without complication [facility]) age 10   insulin requiring from the start  . Diabetic neuropathy ([place]) Dx 20110  . Diabetic proliferative retinopathy ([place]) 08/02/2017  . Gastritis   . Gastroparesis 2012   100% gastric retention on 08/2013 [facility]  . GERD (gastroesophageal reflux disease) Dx 2012  . Hx of adenomatous polyp of colon 10/05/2018  . Hyperlipidemia   . Hypertension Dx 2012  . Neuromuscular disorder ([place])   . Obesity    Past Surgical History  Past Surgical History:  Procedure Laterality Date  . CHOLECYSTECTOMY  2013  . ESOPHAGOGASTRODUODENOSCOPY N/A 06/11/2013   Procedure: ESOPHAGOGASTRODUODENOSCOPY (EGD);  Surgeon: [name], MD;  Location: [facility];  Service: Endoscopy;  Laterality: N/A;  . EYE SURGERY Left   .  FLEXIBLE SIGMOIDOSCOPY N/A 06/11/2013   Procedure: FLEXIBLE SIGMOIDOSCOPY;  Surgeon: [name], MD;  Location: [facility];  Service: Endoscopy;  Laterality: N/A;  . KNEE SURGERY Left    Family History  Family History  Problem Relation Age of Onset  . Diabetes Father   . Diabetes Maternal Grandmother   . Diabetes Paternal Grandmother   . Colon cancer Cousin   . Pancreatic cancer Paternal Grandfather   . Lung cancer Paternal Grandfather        mets from pancreas  . Stomach cancer Neg Hx   . Rectal cancer Neg Hx   . Esophageal cancer Neg Hx    Social History  reports that he has never smoked. He has never used smokeless tobacco. He reports that he does not drink alcohol or use drugs. Allergies No Known Allergies Home medications Prior to Admission medications   Medication Sig Start Date End Date Taking? Authorizing Provider  atorvastatin (LIPITOR) 20 MG tablet Take 1 tablet (20 mg total) by mouth daily. 10/21/18  Yes Charlott Rakes, MD  diltiazem (CARTIA XT) 180 MG 24 hr capsule Take 1 capsule (180 mg total) by mouth daily. 10/21/18  Yes Charlott Rakes, MD  DULoxetine (CYMBALTA) 60 MG capsule Take 1 capsule (  60 mg total) by mouth daily. 10/21/18  Yes Charlott Rakes, MD  furosemide (LASIX) 40 MG tablet Take 1.5 tablets (60 mg total) by mouth 2 (two) times daily. Patient taking differently: Take 40 mg by mouth 2 (two) times daily.  06/18/18  Yes Charlott Rakes, MD  gabapentin (NEURONTIN) 300 MG capsule Take 1 capsule (300 mg total) by mouth at bedtime. 10/21/18  Yes Charlott Rakes, MD  Insulin Glargine (LANTUS SOLOSTAR) 100 UNIT/ML Solostar Pen Inject 40 Units into the skin 2 (two) times daily. 10/21/18  Yes Newlin, Enobong, MD  insulin lispro (HUMALOG KWIKPEN) 100 UNIT/ML KwikPen INJECT 15 UNITS INTO THE SKIN 3 TIMES DAILY. Patient taking differently: Inject 15 Units into the skin 3 (three) times daily.  10/21/18  Yes Charlott Rakes, MD  lisinopril (ZESTRIL) 40 MG tablet Take 1 tablet (40  mg total) by mouth daily. 10/21/18  Yes Charlott Rakes, MD  methocarbamol (ROBAXIN) 500 MG tablet Take 1 tablet (500 mg total) by mouth every 8 (eight) hours as needed for muscle spasms. 01/19/19  Yes Charlott Rakes, MD  metoCLOPramide (REGLAN) 10 MG tablet Take a half hour before meals Patient taking differently: Take 10 mg by mouth 3 (three) times daily before meals.  01/29/19  Yes Esterwood, Amy S, PA-C  pantoprazole (PROTONIX) 40 MG tablet Take 1 tablet (40 mg total) by mouth daily. 01/29/19  Yes Esterwood, Amy S, PA-C  Peppermint Oil (IBGARD) 90 MG CPCR Take 2 capsules by mouth 3 (three) times daily as needed. Patient taking differently: Take 1 capsule by mouth daily as needed (IBD).  10/11/17  Yes [name], MD  potassium chloride (MICRO-K) 10 MEQ CR capsule Take 1 capsule (10 mEq total) by mouth daily. 04/07/18  Yes Newlin, Charlane Ferretti, MD  promethazine (PHENERGAN) 25 MG tablet TAKE 1 TABLET BY MOUTH EVERY 8 HOURS AS NEEDED FOR NAUSEA OR VOMITING. Patient taking differently: Take 25 mg by mouth every 8 (eight) hours as needed for nausea or vomiting.  01/29/19  Yes Esterwood, Amy S, PA-C  sucralfate (CARAFATE) 1 g tablet TAKE 1 TABLET (1 G TOTAL) BY MOUTH 4 (FOUR) TIMES DAILY. 11/06/18  Yes Charlott Rakes, MD  terbinafine (LAMISIL) 250 MG tablet Take 1 tablet (250 mg total) by mouth daily. 11/17/18  Yes Evans, Brent M, DPM  XIFAXAN 550 MG TABS tablet TAKE 1 TABLET (550 MG TOTAL) BY MOUTH 3 (THREE) TIMES DAILY. Patient taking differently: Take 550 mg by mouth daily as needed (Bacterial infrction in stomach).  11/25/18  Yes Charlott Rakes, MD  Blood Glucose Monitoring Suppl (TRUE METRIX METER) W/DEVICE KIT 1 each by Does not apply route 4 (four) times daily -  before meals and at bedtime. 11/05/14   Funches, Adriana Mccallum, MD  glucose blood (CONTOUR NEXT TEST) test strip Use as instructed 10/11/17   Elayne Snare, MD  glucose blood (CONTOUR NEXT TEST) test strip Use as instructed 03/21/18   Kerin Perna, NP  glucose blood (TRUE METRIX BLOOD GLUCOSE TEST) test strip 1 each by Other route 3 (three) times daily. 08/20/16   Funches, Adriana Mccallum, MD  hyoscyamine (LEVSIN) 0.125 MG tablet TAKE 1 TABLET (0.125 MG TOTAL) BY MOUTH 3 (THREE) TIMES DAILY WITH MEALS AS NEEDED. Patient not taking: Reported on 02/24/2019 07/28/18   Esterwood, Amy S, PA-C  Insulin Pen Needle 31G X 8 MM MISC Inject 1 each into the skin 4 (four) times daily. Check blood sugar TID and QHS 06/15/16   Funches, Josalyn, MD  Pancrelipase, Lip-Prot-Amyl, (ZENPEP) 40000-126000 units  CPEP Take 2 capsules by mouth 3 (three) times daily with meals. Take during meal Patient not taking: Reported on 02/24/2019 02/28/18   Esterwood, Amy S, PA-C  pantoprazole (PROTONIX) 40 MG tablet Take 1 tablet (40 mg total) by mouth 2 (two) times daily. Patient not taking: Reported on 02/24/2019 07/14/18   Charlott Rakes, MD  traMADol (ULTRAM) 50 MG tablet Take 1 tablet (50 mg total) by mouth every 6 (six) hours as needed. 02/23/19   Charlann Lange, PA-C  TRUEPLUS LANCETS 28G MISC 1 each by Does not apply route 4 (four) times daily -  before meals and at bedtime. 11/04/14   Boykin Nearing, MD   Liver Function Tests Recent Labs  Lab 02/23/19 0144 02/24/19 0542 02/25/19 0259  AST _0 ALT _1 ALKPHOS 83 94 73  BILITOT 0.5 0.5 1.1  PROT 7.1 7.8 6.4*  ALBUMIN 3.1* 3.5 2.7*   Recent Labs  Lab 02/23/19 0144 02/24/19 0542  LIPASE 19 22   CBC Recent Labs  Lab 02/23/19 0144 02/24/19 0542 02/25/19 0259  WBC 6.2 7.6 9.2  NEUTROABS  --  6.2  --   HGB 12.9* 14.0 12.4*  HCT 42.3 44.7 41.1  MCV 85.5 84.3 87.1  PLT 225 264 378   Basic Metabolic Panel Recent Labs  Lab 02/23/19 0144 02/24/19 0542 02/25/19 0259 02/26/19 0340 02/27/19 0217 02/27/19 1509  NA 140 142 144 143 149* 150*  K 4.5 4.7 5.7* 4.2 4.4 4.3  CL 106 107 112* 111 119* 120*  CO2 23 21* 23 23 21* 19*  GLUCOSE 253* 291* 211* 286* 184* 202*  BUN 24* 32* 35* 51* 50* 53*   CREATININE 2.55* 2.67* 2.98* 3.77* 3.40* 3.59*  CALCIUM 8.4* 8.8* 8.0* 7.8* 7.7* 7.9*   Iron/TIBC/Ferritin/ %Sat    Component Value Date/Time   FERRITIN 259 02/24/2019 2019    Vitals:   02/27/19 1430 02/27/19 1500 02/27/19 1530 02/27/19 1600  BP: (!) 216/95 (!) 193/93 (!) 171/85 (!) 161/83  Pulse: 87 86 91 88  Resp: _2 Temp:      TempSrc:      SpO2: 97% 97% 95% 96%  Weight:      Height:        Exam Gen alert, nauseous, no distress, lying flat No rash, cyanosis or gangrene Sclera anicteric, throat clear  No jvd or bruits Chest dec'd at bases, no rales or rhonchi RRR no MRG Abd soft ntnd no mass or ascites +bs obese GU normal male MS no joint effusions or deformity Ext 2+ bilat UE edema, no LE edema Neuro is alert, Ox 3 , nf    Home meds:  - diltiazem 180 qd/ furosemide 40 bid/ lisinopril 40 qd  - insulin glargine 40 bid/ lispro 15 tid ac  - sucralfate 1gm qid/ pantoprazole 40 qd/ metoclopramide 10 tid  - atorvastatin 20 qd  - duloxetine 60 qd/ gabapentin 300 hs  - terbinafine 250 qd     UA 1/12 > 0-5 wbc/ rbc, >300 prot, 5 ketones, >500 glucose   Creat 2019 1.75- 2.49   April 2020 creat 1.72   CXR 1/15 - no active disease   Assessment/ Plan: 1. AKI on CKD 3 - baseline creat 1.8- 2.3. Will get OP records, f/b Dr Carolin Sicks.  Pt vol overloaded on exam, start IV lasix 60 mg tid. DC IVF's. Will follow.  2. HTN urgency - BP's down on IV clevidipine now changed to IV labetalol. Cannot  tolerate po meds d/t #5.  Get vol down. Cont drips.  3. DM2 - longstanding w/ mult complications 4.  COVID + pna - CXR neg, nasal O2, no distress 5. Gastroparesis - sig issue      Kelly Splinter  MD 02/27/2019, 4:38 PM

## 2019-02-28 LAB — CREATININE, URINE, RANDOM: Creatinine, Urine: 126.71 mg/dL

## 2019-02-28 LAB — BASIC METABOLIC PANEL
Anion gap: 11 (ref 5–15)
BUN: 57 mg/dL — ABNORMAL HIGH (ref 6–20)
CO2: 17 mmol/L — ABNORMAL LOW (ref 22–32)
Calcium: 7.8 mg/dL — ABNORMAL LOW (ref 8.9–10.3)
Chloride: 117 mmol/L — ABNORMAL HIGH (ref 98–111)
Creatinine, Ser: 3.76 mg/dL — ABNORMAL HIGH (ref 0.61–1.24)
GFR calc Af Amer: 22 mL/min — ABNORMAL LOW (ref >60)
GFR calc non Af Amer: 19 mL/min — ABNORMAL LOW (ref >60)
Glucose, Bld: 222 mg/dL — ABNORMAL HIGH (ref 70–99)
Potassium: 4.2 mmol/L (ref 3.5–5.1)
Sodium: 145 mmol/L (ref 135–145)

## 2019-02-28 LAB — GLUCOSE, CAPILLARY
Glucose-Capillary: 199 mg/dL — ABNORMAL HIGH (ref 70–99)
Glucose-Capillary: 155 mg/dL — ABNORMAL HIGH (ref 70–99)
Glucose-Capillary: 157 mg/dL — ABNORMAL HIGH (ref 70–99)
Glucose-Capillary: 153 mg/dL — ABNORMAL HIGH (ref 70–99)

## 2019-02-28 LAB — SODIUM, URINE, RANDOM: Sodium, Ur: 63 mmol/L

## 2019-02-28 LAB — D-DIMER, QUANTITATIVE: D-Dimer, Quant: 2.75 ug/mL-FEU — ABNORMAL HIGH (ref 0.00–0.50)

## 2019-02-28 LAB — C-REACTIVE PROTEIN: CRP: 6 mg/dL — ABNORMAL HIGH (ref <1.0)

## 2019-02-28 MED ORDER — ORAL CARE MOUTH RINSE
15.0000 mL | Freq: Two times a day (BID) | OROMUCOSAL | Status: DC
  Administered 2019-02-28 – 2019-03-04 (×9): 15 mL via OROMUCOSAL

## 2019-02-28 MED ORDER — HEPARIN SODIUM (PORCINE) 10000 UNIT/ML IJ SOLN
7500.0000 [IU] | Freq: Three times a day (TID) | INTRAMUSCULAR | Status: DC
  Administered 2019-02-28 – 2019-03-01 (×3): 7500 [IU] via SUBCUTANEOUS
  Filled 2019-02-28 (×4): qty 1

## 2019-02-28 MED ORDER — MORPHINE SULFATE (PF) 2 MG/ML IV SOLN
2.0000 mg | INTRAVENOUS | Status: DC | PRN
  Administered 2019-02-28 – 2019-03-03 (×10): 2 mg via INTRAVENOUS
  Filled 2019-02-28 (×10): qty 1

## 2019-02-28 NOTE — Progress Notes (Signed)
[facility] PROGRESS NOTE    Progress Note  Duane [name]  FXT:[id number] DOB: [birth year] DOA: 02/24/2019 PCP: Charlott Rakes, MD     Brief Narrative:   Duane [name] is an 44 y.o. male past medical history significant for chronic diarrhea, kidney disease stage III, depression diabetes mellitus type 1 with peripheral neuropathy, diabetic gastroparesis and diabetic neuropathy, morbid obesity who returns to the emergency room for the second time in the last 2 days with multiple episodes of nausea vomiting about 30 and a 24-hour period, with mild epigastric pain.  ED was found with a pulse of 100 blood pressure 183/116.  Status post convalescent plasma on 02/25/2019  Assessment/Plan:   Acute respiratory failure with hypoxia secondary to pneumonia due to COVID-19: [name]  still requiring 2 L of oxygen to keep saturations greater than 92%. Try to wean to room as we progress in diuresis. His inflammatory markers are improving today.  He has remained afebrile Continue IV remdesivir and steroids, cont remdesivir for 5 day and steroids for < 10 days.  Acute nausea and vomiting due to diabetes gastroparesis: Patient is currently on clear liq diet he relates he is tolerating ginger ale. He is still having dry hiving. Cont. reglan erythromycin and zofran as needed. kvo iv fluids  New Hypertensive urgency/essential hypertension: Will keep him on labetalol drip for now, until he is able to tolerate more of his diet Continue IV labetalol for his blood pressure..  Blood pressure is also controlled after starting IV morphine.  Question if he was withdrawing or the pain was driving his blood pressure to rise.  Acute kidney injury on chronic kidney disease stage IIIb: With a baseline creatinine of 1.5-2.0, on an ACE inhibitor on admission he was aggressively fluid hydrated and his creatinine progressively got worse, renal ultrasound was done that showed no acute findings, follow strict I's  and O's and daily weights renal was consulted recommended IV diuresis appreciate renal's assistance.  Narcotic seeking behavior: Patient overnight came complaining of abdominal pain he was started on IV morphine. His pain seems to be controlled.  New mild hypernatremia: Started on IV diuresis and his hypernatremia has resolved.  Hyperkalemia: Resolved with oral Kayexalate.  Diabetes mellitus type 1 uncontrolled: Continue long-acting insulin plus sliding scale.  Depression Resume Cymbalta.  DVT prophylaxis: lovenox Family Communication:none Disposition Plan/Barrier to D/C: He came from home and can go back home once he is on his oral antihypertensive medication and his creatinine has returned to baseline. Code Status:     Code Status Orders  (From admission, onward)         Start     Ordered   02/24/19 0932  Full code  Continuous     02/24/19 0933        Code Status History    Date Active Date Inactive Code Status Order ID Comments User Context   12/21/2017 0857 12/24/2017 1906 Full Code [id number]  Shelly Coss, MD ED   10/16/2015 0142 10/17/2015 1904 Full Code [id number]  Norval Morton, MD ED   01/01/2015 0554 01/05/2015 2043 Full Code [id number]  Etta Quill, DO ED   06/27/2014 0910 07/01/2014 2217 Full Code [id number]  Thurnell Lose, MD Inpatient   05/11/2014 1116 05/24/2014 2005 Full Code [id number]  [name], MD Inpatient   05/05/2014 0545 05/06/2014 1856 Full Code [id number]  Ivor Costa, MD Inpatient   04/30/2014 0705 05/03/2014 1632 Full Code [id number]  Lavina Hamman, MD ED   02/16/2014 [phone]  02/18/2014 2031 Full Code [id number]  Etta Quill, DO ED   02/03/2014 1004 02/05/2014 2130 Full Code [id number]  Thurnell Lose, MD Inpatient   12/31/2013 0341 01/10/2014 1714 Full Code [id number]  Berle Mull, MD Inpatient   10/14/2013 1603 10/19/2013 1651 Full Code [id number]  [name], MD ED   09/22/2013 0302 10/06/2013 2017 Full Code [id number]  [place], [place], DO  Inpatient   08/27/2013 1532 08/29/2013 1717 Full Code [id number]  Louellen Molder, MD Inpatient   08/16/2013 2146 08/19/2013 1929 Full Code [id number]  Theressa Millard, MD ED   06/04/2013 1750 06/12/2013 2019 Full Code [id number]  Eugenie Filler, MD Inpatient   05/30/2013 2223 06/02/2013 1749 Full Code [id number]  Merton Border, MD Inpatient   01/09/2013 0450 01/10/2013 0014 Full Code [id number]  Etta Quill, DO Inpatient   Advance Care Planning Activity        IV Access:    Peripheral IV   Procedures and diagnostic studies:   US Renal  Result Date: 02/27/2019 CLINICAL DATA:  Renal failure. EXAM: RENAL / URINARY TRACT ULTRASOUND COMPLETE COMPARISON:  No prior. FINDINGS: Right Kidney: Renal measurements: 13.4 x 6.7 x 6.8 cm = volume: 319 mL . Echogenicity within normal limits. No mass or hydronephrosis visualized. Left Kidney: Renal measurements: 11.6 x 6.8 x 5.9 cm = volume: 235 mL. Echogenicity within normal limits. No mass or hydronephrosis visualized. Bladder: Appears normal for degree of bladder distention. Left ureteral jet not visualized. Other: None. IMPRESSION: No acute abnormality identified.  No evidence of hydronephrosis. Electronically Signed   By: Marcello Moores  Register   On: 02/27/2019 15:27   DG CHEST PORT 1 VIEW  Result Date: 02/27/2019 CLINICAL DATA:  Dyspnea EXAM: PORTABLE CHEST 1 VIEW COMPARISON:  02/25/2019 FINDINGS: The heart size and mediastinal contours are within normal limits. Both lungs are clear. The visualized skeletal structures are unremarkable. IMPRESSION: No active disease. Electronically Signed   By: [name]   On: 02/27/2019 09:53   DG Abd 2 Views  Result Date: 02/26/2019 CLINICAL DATA:  44 year old male with abdominal pain. EXAM: ABDOMEN - 2 VIEW COMPARISON:  CT of the abdomen pelvis dated 06/29/2014 and abdominal radiograph dated 01/01/2015. FINDINGS: Evaluation is limited due to patient's body habitus. There is no bowel dilatation or evidence of  obstruction. No free air identified. Right upper quadrant cholecystectomy clips. Bibasilar confluent and nodular densities may represent atelectasis or infiltrate. Clinical correlation is recommended. The osseous structures and soft tissues are grossly unremarkable. IMPRESSION: 1. No evidence of bowel obstruction. 2. Bibasilar atelectasis or infiltrate. Electronically Signed   By: Anner Crete M.D.   On: 02/26/2019 18:59   [name] EKG SITE RITE  Result Date: 02/27/2019 If Site Rite image not attached, placement could not be confirmed due to current cardiac rhythm.    Medical Consultants:    None.  Anti-Infectives:   None  Subjective:    [name] he wants to continue to drink ginger ale, he relates his abdominal pain is improved  Objective:    Vitals:   02/28/19 0300 02/28/19 0400 02/28/19 0500 02/28/19 0600  BP: (!) 157/72 138/69 (!) 149/68 (!) 128/39  Pulse: 82 77 77 80  Resp: 16 15 15 16   Temp:      TempSrc:      SpO2: (!) 87% 91% 90% 93%  Weight:   (!) 147.7 kg   Height:       SpO2: 93 % O2 Flow Rate (L/min): 2 L/min   Intake/Output Summary (  Last 24 hours) at 02/28/2019 0735 Last data filed at 02/28/2019 6213 Gross per 24 hour  Intake 1854.05 ml  Output 2800 ml  Net -945.95 ml   Filed Weights   02/24/19 1737 02/27/19 0457 02/28/19 0500  Weight: (!) 152 kg (!) 148.8 kg (!) 147.7 kg    Exam: General exam: In no acute distress. Respiratory system: Good air movement and clear to auscultation. Cardiovascular system: S1 & S2 heard, RRR. No JVD. Gastrointestinal system: Abdomen is nondistended, soft and nontender.  Central nervous system: Alert and oriented. No focal neurological deficits. Extremities: No pedal edema. Skin: No rashes, lesions or ulcers Psychiatry: Judgement and insight appear normal. Mood & affect appropriate.  Data Reviewed:    Labs: Basic Metabolic Panel: Recent Labs  Lab 02/25/19 0259 02/25/19 0259 02/26/19 0340 02/26/19 0340  02/27/19 0217 02/27/19 0217 02/27/19 1509 02/28/19 0132  NA 144  --  143  --  149*  --  150* 145  K 5.7*   < > 4.2   < > 4.4   < > 4.3 4.2  CL 112*  --  111  --  119*  --  120* 117*  CO2 23  --  23  --  21*  --  19* 17*  GLUCOSE 211*  --  286*  --  184*  --  202* 222*  BUN 35*  --  51*  --  50*  --  53* 57*  CREATININE 2.98*  --  3.77*  --  3.40*  --  3.59* 3.76*  CALCIUM 8.0*  --  7.8*  --  7.7*  --  7.9* 7.8*   < > = values in this interval not displayed.   GFR Estimated Creatinine Clearance: 40.3 mL/min (A) (by C-G formula based on SCr of 3.76 mg/dL (H)). Liver Function Tests: Recent Labs  Lab 02/23/19 0144 02/24/19 0542 02/25/19 0259  AST 24 25 31   ALT 23 25 21   ALKPHOS 83 94 73  BILITOT 0.5 0.5 1.1  PROT 7.1 7.8 6.4*  ALBUMIN 3.1* 3.5 2.7*   Recent Labs  Lab 02/23/19 0144 02/24/19 0542  LIPASE 19 22   No results for input(s): AMMONIA in the last 168 hours. Coagulation profile No results for input(s): INR, PROTIME in the last 168 hours. COVID-19 Labs  Recent Labs    02/27/19 0217 02/28/19 0132  DDIMER 1.84* 2.75*  CRP 11.5* 6.0*    Lab Results  Component Value Date   SARSCOV2NAA POSITIVE (A) 02/24/2019    CBC: Recent Labs  Lab 02/23/19 0144 02/24/19 0542 02/25/19 0259  WBC 6.2 7.6 9.2  NEUTROABS  --  6.2  --   HGB 12.9* 14.0 12.4*  HCT 42.3 44.7 41.1  MCV 85.5 84.3 87.1  PLT 225 264 291   Cardiac Enzymes: No results for input(s): CKTOTAL, CKMB, CKMBINDEX, TROPONINI in the last 168 hours. BNP (last 3 results) No results for input(s): PROBNP in the last 8760 hours. CBG: Recent Labs  Lab 02/27/19 1230 02/27/19 1653 02/27/19 1953 02/27/19 2352 02/28/19 0408  GLUCAP 173* 174* 186* 212* 199*   D-Dimer: Recent Labs    02/27/19 0217 02/28/19 0132  DDIMER 1.84* 2.75*   Hgb A1c: No results for input(s): HGBA1C in the last 72 hours. Lipid Profile: No results for input(s): CHOL, HDL, LDLCALC, TRIG, CHOLHDL, LDLDIRECT in the last 72  hours. Thyroid function studies: No results for input(s): TSH, T4TOTAL, T3FREE, THYROIDAB in the last 72 hours.  Invalid input(s): FREET3 Anemia work up: No  results for input(s): VITAMINB12, FOLATE, FERRITIN, TIBC, IRON, RETICCTPCT in the last 72 hours. Sepsis Labs: Recent Labs  Lab 02/23/19 0144 02/24/19 0542 02/25/19 0259  WBC 6.2 7.6 9.2   Microbiology Recent Results (from the past 240 hour(s))  SARS CORONAVIRUS 2 (TAT 6-24 HRS) Nasopharyngeal Nasopharyngeal Swab     Status: Abnormal   Collection Time: 02/24/19  1:12 PM   Specimen: Nasopharyngeal Swab  Result Value Ref Range Status   SARS Coronavirus 2 POSITIVE (A) NEGATIVE Final    Comment: RESULT CALLED TO, READ BACK BY AND VERIFIED WITH: S.HOEFLER,RN [id number] I.MANNING (NOTE) SARS-CoV-2 target nucleic acids are DETECTED. The SARS-CoV-2 RNA is generally detectable in upper and lower respiratory specimens during the acute phase of infection. Positive results are indicative of the presence of SARS-CoV-2 RNA. Clinical correlation with patient history and other diagnostic information is  necessary to determine patient infection status. Positive results do not rule out bacterial infection or co-infection with other viruses.  The expected result is Negative. Fact Sheet for Patients: SugarRoll.be Fact Sheet for Healthcare Providers: https://www.woods-mathews.com/ This test is not yet approved or cleared by the [place] FDA and  has been authorized for detection and/or diagnosis of SARS-CoV-2 by FDA under an Emergency Use Authorization (EUA). This EUA will remain  in effect (meaning this test can be used) for th e duration of the COVID-19 declaration under Section 564(b)(1) of the Act, 21 U.S.C. section 360bbb-3(b)(1), unless the authorization is terminated or revoked sooner. Performed at [facility], [place] [address], [place], [place] [address]   MRSA PCR Screening      Status: None   Collection Time: 02/26/19  6:41 PM   Specimen: Nasal Mucosa; Nasopharyngeal  Result Value Ref Range Status   MRSA by PCR NEGATIVE NEGATIVE Final    Comment:        The GeneXpert MRSA Assay (FDA approved for NASAL specimens only), is one component of a comprehensive MRSA colonization surveillance program. It is not intended to diagnose MRSA infection nor to guide or monitor treatment for MRSA infections. Performed at [facility], [place] [address] [place], [place] [address]      Medications:    atorvastatin  20 mg Oral Daily   Chlorhexidine Gluconate Cloth  6 each Topical Q0600   dexamethasone (DECADRON) injection  6 mg Intravenous Q24H   DULoxetine  60 mg Oral Daily   enoxaparin (LOVENOX) injection  75 mg Subcutaneous Q24H   furosemide  60 mg Intravenous Q8H   gabapentin  300 mg Oral QHS   insulin aspart  0-9 Units Subcutaneous Q4H   insulin glargine  30 Units Subcutaneous BID   metoCLOPramide (REGLAN) injection  10 mg Intravenous Q6H   pantoprazole (PROTONIX) IV  40 mg Intravenous Q12H   sucralfate  1 g Oral QID   traZODone  50 mg Oral QHS   Continuous Infusions:  sodium chloride Stopped (02/27/19 1805)   sodium chloride 5 mL/hr at 02/27/19 1900   erythromycin Stopped (02/28/19 0719)   labetalol (NORMODYNE) infusion 3 mg/min (02/28/19 0410)   labetalol (NORMODYNE) infusion 2.5 mg/min (02/27/19 1900)   remdesivir 100 mg in NS 100 mL Stopped (02/27/19 1424)      LOS: 3 days   Charlynne Cousins  [facility]  02/28/2019, 7:35 AM

## 2019-02-28 NOTE — Progress Notes (Signed)
[facility] Progress Note  Subjective: per RN stood to weigh and pt had sig orthostatic symptoms. IV labetalol is off.  2.8 L out yest w/ IV lasix 80 tid. No c/o today except for pain.   Vitals:   02/28/19 1130 02/28/19 1200 02/28/19 1249 02/28/19 1300  BP: (!) 145/86 (!) 136/92  127/83  Pulse: 82 83  81  Resp: 15 19  19   Temp:   98.5 F (36.9 C)   TempSrc:   Oral   SpO2: 94% 95%  94%  Weight:      Height:        Exam: Gen alert, nauseous, no distress, lying flat No jvd or bruits Chest dec'd at bases, no rales or rhonchi RRR no MRG Abd soft ntnd no mass or ascites +bs obese Ext 2+ bilat UE edema, no LE edema Neuro is alert, Ox 3 , nf    Home meds:  - diltiazem 180 qd/ furosemide 40 bid/ lisinopril 40 qd  - insulin glargine 40 bid/ lispro 15 tid ac  - sucralfate 1gm qid/ pantoprazole 40 qd/ metoclopramide 10 tid  - atorvastatin 20 qd  - duloxetine 60 qd/ gabapentin 300 hs  - terbinafine 250 qd     UA 1/12 > 0-5 wbc/ rbc, >300 prot, 5 ketones, >500 glucose   Creat 2019 1.75- 2.49   April 2020 creat 1.72   CXR 1/15 - no active disease   Assessment/ Plan: 1. AKI on CKD 3 - baseline creat 1.8- 2.5. F/b Dr Carolin Sicks, get records on Monday. Pt vol overloaded on exam but orthostatic today after IV lasix yest, will hold further diuresis for now. May be 3rd spacing fluids in context of acute viral illness. Creat up slightly. Cont supportive care.   2. COVID + pna - CXR neg, nasal O2, no distress 3. HTN urgency - sp IV clevidipine then IV labetalol, now is off IV meds. Not tolerating po meds but BP's are okay today. 4. DM2 - longstanding 5. Gastroparesis - sig issue, pt has bad DM w/ multiple complications and I0X 65.5     Rob Maalik Pinn 02/28/2019, 2:06 PM  Inpatient medications: . atorvastatin  20 mg Oral Daily  . Chlorhexidine Gluconate Cloth  6 each Topical Q0600  . dexamethasone (DECADRON) injection  6 mg Intravenous Q24H  . DULoxetine  60 mg Oral Daily   . furosemide  60 mg Intravenous Q8H  . gabapentin  300 mg Oral QHS  . heparin injection (subcutaneous)  7,500 Units Subcutaneous Q8H  . insulin aspart  0-9 Units Subcutaneous Q4H  . insulin glargine  30 Units Subcutaneous BID  . mouth rinse  15 mL Mouth Rinse BID  . metoCLOPramide (REGLAN) injection  10 mg Intravenous Q6H  . pantoprazole (PROTONIX) IV  40 mg Intravenous Q12H  . sucralfate  1 g Oral QID  . traZODone  50 mg Oral QHS   . sodium chloride 5 mL/hr at 02/28/19 1300  . sodium chloride Stopped (02/28/19 1203)  . erythromycin 100 mL/hr at 02/28/19 1300  . labetalol (NORMODYNE) infusion 1 mg/min (02/28/19 1109)  . labetalol (NORMODYNE) infusion Stopped (02/28/19 1207)  . remdesivir 100 mg in NS 100 mL Stopped (02/28/19 1107)   sodium chloride, sodium chloride, alum & mag hydroxide-simeth, hydrALAZINE, morphine injection, promethazine, traMADol

## 2019-02-28 NOTE — Progress Notes (Addendum)
Extremely hypertensive earlier in the shift for the first 6 hours. : increased labetalol to 3mg  without success and given prn antihypertensives. Continued hypertension: patient  Uncontrolled pain , states it hurts really bad: after given morphine BP much improved .Only received 1 dose of morphine all 12 hours

## 2019-03-01 ENCOUNTER — Inpatient Hospital Stay (HOSPITAL_COMMUNITY): Payer: Medicare Other

## 2019-03-01 LAB — GLUCOSE, CAPILLARY
Glucose-Capillary: 145 mg/dL — ABNORMAL HIGH (ref 70–99)
Glucose-Capillary: 151 mg/dL — ABNORMAL HIGH (ref 70–99)
Glucose-Capillary: 163 mg/dL — ABNORMAL HIGH (ref 70–99)
Glucose-Capillary: 167 mg/dL — ABNORMAL HIGH (ref 70–99)
Glucose-Capillary: 167 mg/dL — ABNORMAL HIGH (ref 70–99)
Glucose-Capillary: 206 mg/dL — ABNORMAL HIGH (ref 70–99)
Glucose-Capillary: 257 mg/dL — ABNORMAL HIGH (ref 70–99)
Glucose-Capillary: 259 mg/dL — ABNORMAL HIGH (ref 70–99)

## 2019-03-01 LAB — RENAL FUNCTION PANEL
Albumin: 2.7 g/dL — ABNORMAL LOW (ref 3.5–5.0)
Anion gap: 9 (ref 5–15)
BUN: 64 mg/dL — ABNORMAL HIGH (ref 6–20)
CO2: 21 mmol/L — ABNORMAL LOW (ref 22–32)
Calcium: 8.1 mg/dL — ABNORMAL LOW (ref 8.9–10.3)
Chloride: 116 mmol/L — ABNORMAL HIGH (ref 98–111)
Creatinine, Ser: 4.27 mg/dL — ABNORMAL HIGH (ref 0.61–1.24)
GFR calc Af Amer: 19 mL/min — ABNORMAL LOW (ref >60)
GFR calc non Af Amer: 16 mL/min — ABNORMAL LOW (ref >60)
Glucose, Bld: 179 mg/dL — ABNORMAL HIGH (ref 70–99)
Phosphorus: 5 mg/dL — ABNORMAL HIGH (ref 2.5–4.6)
Potassium: 4.4 mmol/L (ref 3.5–5.1)
Sodium: 146 mmol/L — ABNORMAL HIGH (ref 135–145)

## 2019-03-01 LAB — C-REACTIVE PROTEIN: CRP: 3.7 mg/dL — ABNORMAL HIGH (ref <1.0)

## 2019-03-01 LAB — D-DIMER, QUANTITATIVE: D-Dimer, Quant: 7.95 ug/mL-FEU — ABNORMAL HIGH (ref 0.00–0.50)

## 2019-03-01 MED ORDER — HEPARIN SODIUM (PORCINE) 10000 UNIT/ML IJ SOLN
10000.0000 [IU] | Freq: Three times a day (TID) | INTRAMUSCULAR | Status: DC
  Administered 2019-03-01 – 2019-03-04 (×9): 10000 [IU] via SUBCUTANEOUS
  Filled 2019-03-01 (×11): qty 1

## 2019-03-01 MED ORDER — AMLODIPINE BESYLATE 10 MG PO TABS
10.0000 mg | ORAL_TABLET | Freq: Every day | ORAL | Status: DC
  Administered 2019-03-01 – 2019-03-04 (×4): 10 mg via ORAL
  Filled 2019-03-01 (×4): qty 1

## 2019-03-01 MED ORDER — SODIUM CHLORIDE 0.45 % IV SOLN: INTRAVENOUS | Status: DC

## 2019-03-01 NOTE — Progress Notes (Signed)
[facility] PROGRESS NOTE    Progress Note  Duane [name]  BZJ:[id number] DOB: 1975/05/07 DOA: 02/24/2019 PCP: Charlott Rakes, MD     Brief Narrative:   Duane [name] is an 44 y.o. male past medical history significant for chronic diarrhea, kidney disease stage III, depression diabetes mellitus type 1 with peripheral neuropathy, diabetic gastroparesis and diabetic neuropathy, morbid obesity who returns to the emergency room for the second time in the last 2 days with multiple episodes of nausea vomiting about 30 and a 24-hour period, with mild epigastric pain.  ED was found with a pulse of 100 blood pressure 183/116.  Status post convalescent plasma on 02/25/2019  Assessment/Plan:   Acute respiratory failure with hypoxia secondary to pneumonia due to COVID-19: Duane [name] is still requiring 2 L of oxygen to keep saturations greater than 94%. We will try to wean to room air, try to keep the patient prone for early 16 hours a day, out of bed to chair, continues into spirometry and flutter valve. He will complete his course of IV remdesivir today, will continue IV steroids for less than 10 days. His inflammatory markers: His CRP is decreasing his D-dimer is rising.  We will have a low threshold to perform lower extremity Dopplers, cannot perform a CT angio of the chest due to his renal dysfunction, we cannot perform VQ scan is on COVID-19 positive patients.  Acute nausea and vomiting due to diabetes gastroparesis: He continue to throw up or is more like dry heaving, and he keeps dening it. Patient is currently on only receiving ginger ale and water by mouth. IV fluids have been KVO due to volume overload on physical exam.  New Hypertensive urgency/essential hypertension: Due to n.p.o. he was started on IV labetalol Question if he was withdrawing or the pain was driving his blood pressure to rise.  His blood pressure gets controlled once he gets IV morphine. Currently he is on IV  labetalol, will transition to oral medication we will start him on oral Norvasc.  Try to wean off the IV labetalol.  Acute kidney injury on chronic kidney disease stage IIIb: Creatinine 1.5-2, in the setting of ACE inhibitor he was aggressively fluid resuscitated on admission despite this his creatinine worsen, renal ultrasound was done that showed no acute findings. We will continue to check strict I's and O's and daily weights, nephrology was consulted who recommended IV diuresis.  Despite this his creatinine continues to rise. UOP is about 900cc. Further management per renal.  Narcotic seeking behavior: Patient overnight came complaining of abdominal pain he was started on IV morphine. His pain seems to be controlled.  New mild hypernatremia: Persistent hypernatremia despite IV diuresis.  Hyperkalemia: Resolved with oral Kayexalate.  Diabetes mellitus type 1 uncontrolled: Continue long-acting insulin plus sliding scale.  Depression Resume Cymbalta.  DVT prophylaxis: lovenox Family Communication:none Disposition Plan/Barrier to D/C: He came from home and can go back home once he is on his oral antihypertensive medication and his creatinine has returned to baseline. Code Status:     Code Status Orders  (From admission, onward)         Start     Ordered   02/24/19 0932  Full code  Continuous     02/24/19 0933        Code Status History    Date Active Date Inactive Code Status Order ID Comments User Context   12/21/2017 0857 12/24/2017 1906 Full Code [id number]  Shelly Coss, MD ED   10/16/2015 [phone]  10/17/2015 1904 Full Code [id number]  Norval Morton, MD ED   01/01/2015 0554 01/05/2015 2043 Full Code [id number]  Etta Quill, DO ED   06/27/2014 0910 07/01/2014 2217 Full Code [id number]  Thurnell Lose, MD Inpatient   05/11/2014 1116 05/24/2014 2005 Full Code [id number]  [name], MD Inpatient   05/05/2014 0545 05/06/2014 1856 Full Code [id number]  Ivor Costa, MD  Inpatient   04/30/2014 0705 05/03/2014 1632 Full Code [id number]  Lavina Hamman, MD ED   02/16/2014 0528 02/18/2014 2031 Full Code [id number]  Etta Quill, DO ED   02/03/2014 1004 02/05/2014 2130 Full Code [id number]  Thurnell Lose, MD Inpatient   12/31/2013 0341 01/10/2014 1714 Full Code [id number]  Berle Mull, MD Inpatient   10/14/2013 1603 10/19/2013 1651 Full Code [id number]  [name], MD ED   09/22/2013 0302 10/06/2013 2017 Full Code [id number]  Carefree, Burt, DO Inpatient   08/27/2013 1532 08/29/2013 1717 Full Code [id number]  Louellen Molder, MD Inpatient   08/16/2013 2146 08/19/2013 1929 Full Code [id number]  Theressa Millard, MD ED   06/04/2013 1750 06/12/2013 2019 Full Code [id number]  Eugenie Filler, MD Inpatient   05/30/2013 2223 06/02/2013 1749 Full Code [id number]  Merton Border, MD Inpatient   01/09/2013 0450 01/10/2013 0014 Full Code [id number]  Etta Quill, DO Inpatient   Advance Care Planning Activity        IV Access:    Peripheral IV   Procedures and diagnostic studies:   US Renal  Result Date: 02/27/2019 CLINICAL DATA:  Renal failure. EXAM: RENAL / URINARY TRACT ULTRASOUND COMPLETE COMPARISON:  No prior. FINDINGS: Right Kidney: Renal measurements: 13.4 x 6.7 x 6.8 cm = volume: 319 mL . Echogenicity within normal limits. No mass or hydronephrosis visualized. Left Kidney: Renal measurements: 11.6 x 6.8 x 5.9 cm = volume: 235 mL. Echogenicity within normal limits. No mass or hydronephrosis visualized. Bladder: Appears normal for degree of bladder distention. Left ureteral jet not visualized. Other: None. IMPRESSION: No acute abnormality identified.  No evidence of hydronephrosis. Electronically Signed   By: Marcello Moores  Register   On: 02/27/2019 15:27   DG CHEST PORT 1 VIEW  Result Date: 03/01/2019 CLINICAL DATA:  Dyspnea. EXAM: PORTABLE CHEST 1 VIEW COMPARISON:  One-view chest x-ray 02/27/2019 FINDINGS: The heart size is normal, exaggerated by low lung volumes. Mild bibasilar  opacities are again seen without significant change. Upper lung fields are clear. Visualized soft tissues and bony thorax are unremarkable. IMPRESSION: Low lung volumes with mild bibasilar atelectasis. No acute cardiopulmonary disease. Electronically Signed   By: San Morelle M.D.   On: 03/01/2019 06:12   DG CHEST PORT 1 VIEW  Result Date: 02/27/2019 CLINICAL DATA:  Dyspnea EXAM: PORTABLE CHEST 1 VIEW COMPARISON:  02/25/2019 FINDINGS: The heart size and mediastinal contours are within normal limits. Both lungs are clear. The visualized skeletal structures are unremarkable. IMPRESSION: No active disease. Electronically Signed   By: [name]   On: 02/27/2019 09:53   [name] EKG SITE RITE  Result Date: 02/27/2019 If Site Rite image not attached, placement could not be confirmed due to current cardiac rhythm.    Medical Consultants:    None.  Anti-Infectives:   None  Subjective:    Duane [name] he denies any vomiting but yet he has a bag next to him.  Objective:    Vitals:   03/01/19 0022 03/01/19 0400 03/01/19 0500 03/01/19 0810  BP:      Pulse:  Resp:      Temp: 97.6 F (36.4 C) (!) 97.5 F (36.4 C)  98.5 F (36.9 C)  TempSrc: Oral Oral  Oral  SpO2:      Weight:   (!) 146.4 kg   Height:       SpO2: 93 % O2 Flow Rate (L/min): 2 L/min   Intake/Output Summary (Last 24 hours) at 03/01/2019 0814 Last data filed at 03/01/2019 0653 Gross per 24 hour  Intake 362.7 ml  Output 1500 ml  Net -1137.3 ml   Filed Weights   02/28/19 0500 02/28/19 1414 03/01/19 0500  Weight: (!) 147.7 kg (!) 145.7 kg (!) 146.4 kg    Exam: General exam: In no acute distress. Respiratory system: Good air movement and clear to auscultation. Cardiovascular system: S1 & S2 heard, RRR. No JVD. Gastrointestinal system: Abdomen is nondistended, soft and nontender.  Central nervous system: Alert and oriented. No focal neurological deficits. Extremities: No pedal edema. Skin: No rashes,  lesions or ulcers Psychiatry: Judgement and insight appear normal. Mood & affect appropriate.  Data Reviewed:    Labs: Basic Metabolic Panel: Recent Labs  Lab 02/26/19 0340 02/26/19 0340 02/27/19 0217 02/27/19 0217 02/27/19 1509 02/27/19 1509 02/28/19 0132 03/01/19 0138  NA 143  --  149*  --  150*  --  145 146*  K 4.2   < > 4.4   < > 4.3   < > 4.2 4.4  CL 111  --  119*  --  120*  --  117* 116*  CO2 23  --  21*  --  19*  --  17* 21*  GLUCOSE 286*  --  184*  --  202*  --  222* 179*  BUN 51*  --  50*  --  53*  --  57* 64*  CREATININE 3.77*  --  3.40*  --  3.59*  --  3.76* 4.27*  CALCIUM 7.8*  --  7.7*  --  7.9*  --  7.8* 8.1*  PHOS  --   --   --   --   --   --   --  5.0*   < > = values in this interval not displayed.   GFR Estimated Creatinine Clearance: 35.3 mL/min (A) (by C-G formula based on SCr of 4.27 mg/dL (H)). Liver Function Tests: Recent Labs  Lab 02/23/19 0144 02/24/19 0542 02/25/19 0259 03/01/19 0138  AST 24 25 31   --   ALT 23 25 21   --   ALKPHOS 83 94 73  --   BILITOT 0.5 0.5 1.1  --   PROT 7.1 7.8 6.4*  --   ALBUMIN 3.1* 3.5 2.7* 2.7*   Recent Labs  Lab 02/23/19 0144 02/24/19 0542  LIPASE 19 22   No results for input(s): AMMONIA in the last 168 hours. Coagulation profile No results for input(s): INR, PROTIME in the last 168 hours. COVID-19 Labs  Recent Labs    02/27/19 0217 02/28/19 0132 03/01/19 0138  DDIMER 1.84* 2.75* 7.95*  CRP 11.5* 6.0* 3.7*    Lab Results  Component Value Date   SARSCOV2NAA POSITIVE (A) 02/24/2019    CBC: Recent Labs  Lab 02/23/19 0144 02/24/19 0542 02/25/19 0259  WBC 6.2 7.6 9.2  NEUTROABS  --  6.2  --   HGB 12.9* 14.0 12.4*  HCT 42.3 44.7 41.1  MCV 85.5 84.3 87.1  PLT 225 264 291   Cardiac Enzymes: No results for input(s): CKTOTAL, CKMB, CKMBINDEX, TROPONINI in the last 168 hours.  BNP (last 3 results) No results for input(s): PROBNP in the last 8760 hours. CBG: Recent Labs  Lab 02/28/19 1602  02/28/19 1942 02/28/19 2341 03/01/19 0329 03/01/19 0748  GLUCAP 153* 206* 163* 167* 151*   D-Dimer: Recent Labs    02/28/19 0132 03/01/19 0138  DDIMER 2.75* 7.95*   Hgb A1c: No results for input(s): HGBA1C in the last 72 hours. Lipid Profile: No results for input(s): CHOL, HDL, LDLCALC, TRIG, CHOLHDL, LDLDIRECT in the last 72 hours. Thyroid function studies: No results for input(s): TSH, T4TOTAL, T3FREE, THYROIDAB in the last 72 hours.  Invalid input(s): FREET3 Anemia work up: No results for input(s): VITAMINB12, FOLATE, FERRITIN, TIBC, IRON, RETICCTPCT in the last 72 hours. Sepsis Labs: Recent Labs  Lab 02/23/19 0144 02/24/19 0542 02/25/19 0259  WBC 6.2 7.6 9.2   Microbiology Recent Results (from the past 240 hour(s))  SARS CORONAVIRUS 2 (TAT 6-24 HRS) Nasopharyngeal Nasopharyngeal Swab     Status: Abnormal   Collection Time: 02/24/19  1:12 PM   Specimen: Nasopharyngeal Swab  Result Value Ref Range Status   SARS Coronavirus 2 POSITIVE (A) NEGATIVE Final    Comment: RESULT CALLED TO, READ BACK BY AND VERIFIED WITH: S.HOEFLER,RN [phone] [id number] I.MANNING (NOTE) SARS-CoV-2 target nucleic acids are DETECTED. The SARS-CoV-2 RNA is generally detectable in upper and lower respiratory specimens during the acute phase of infection. Positive results are indicative of the presence of SARS-CoV-2 RNA. Clinical correlation with patient history and other diagnostic information is  necessary to determine patient infection status. Positive results do not rule out bacterial infection or co-infection with other viruses.  The expected result is Negative. Fact Sheet for Patients: SugarRoll.be Fact Sheet for Healthcare Providers: https://www.woods-mathews.com/ This test is not yet approved or cleared by the [place] FDA and  has been authorized for detection and/or diagnosis of SARS-CoV-2 by FDA under an Emergency Use Authorization (EUA).  This EUA will remain  in effect (meaning this test can be used) for th e duration of the COVID-19 declaration under Section 564(b)(1) of the Act, 21 U.S.C. section 360bbb-3(b)(1), unless the authorization is terminated or revoked sooner. Performed at [facility], [place] [address] [place], [place] [address]   MRSA PCR Screening     Status: None   Collection Time: 02/26/19  6:41 PM   Specimen: Nasal Mucosa; Nasopharyngeal  Result Value Ref Range Status   MRSA by PCR NEGATIVE NEGATIVE Final    Comment:        The GeneXpert MRSA Assay (FDA approved for NASAL specimens only), is one component of a comprehensive MRSA colonization surveillance program. It is not intended to diagnose MRSA infection nor to guide or monitor treatment for MRSA infections. Performed at [facility], [place] [address], [place],  [address]      Medications:   . atorvastatin  20 mg Oral Daily  . Chlorhexidine Gluconate Cloth  6 each Topical Q0600  . dexamethasone (DECADRON) injection  6 mg Intravenous Q24H  . DULoxetine  60 mg Oral Daily  . gabapentin  300 mg Oral QHS  . heparin injection (subcutaneous)  7,500 Units Subcutaneous Q8H  . insulin aspart  0-9 Units Subcutaneous Q4H  . insulin glargine  30 Units Subcutaneous BID  . mouth rinse  15 mL Mouth Rinse BID  . metoCLOPramide (REGLAN) injection  10 mg Intravenous Q6H  . pantoprazole (PROTONIX) IV  40 mg Intravenous Q12H  . sucralfate  1 g Oral QID  . traZODone  50 mg  Oral QHS   Continuous Infusions: . sodium chloride 5 mL/hr at 02/28/19 1700  . sodium chloride 5 mL/hr at 02/28/19 1700  . erythromycin 250 mg (03/01/19 0653)  . labetalol (NORMODYNE) infusion 1 mg/min (02/28/19 1109)  . remdesivir 100 mg in NS 100 mL Stopped (02/28/19 1107)      LOS: 4 days   Charlynne Cousins  [facility]  03/01/2019, 8:14 AM

## 2019-03-01 NOTE — Evaluation (Signed)
Physical Therapy Evaluation Patient Details Name: Duane [name] MRN: [id number] DOB: 11-25-1975 Today's Date: 03/01/2019   History of Present Illness  44 yo male admitted with COVID Pna, AKI, gastroparesis, orthostatic hypotension. Hx of DM, neuropathy, CKD, OA, NM d/o, MDD, BPPV, obesity  Clinical Impression  On eval, pt required Min assist +2 for mobility. He was able to stand x 3 and pivot to recliner. He is weak and fatigue easily. He is also having issues with orthostatic hypotension. BP 98/56 with standing. Will continue to follow and progress activity as tolerated.     Follow Up Recommendations Home health PT;Supervision/Assistance - 24 hour (depending on progress)    Equipment Recommendations  (continuing to assess)    Recommendations for Other Services       Precautions / Restrictions Precautions Precautions: Fall Precaution Comments: orthostatic hypotension Restrictions Weight Bearing Restrictions: No      Mobility  Bed Mobility Overal bed mobility: Needs Assistance Bed Mobility: Supine to Sit     Supine to sit: Supervision;HOB elevated     General bed mobility comments: for safety  Transfers Overall transfer level: Needs assistance   Transfers: Sit to/from Stand;Stand Pivot Transfers Sit to Stand: Min assist;+2 physical assistance;+2 safety/equipment Stand pivot transfers: Min assist       General transfer comment: Assist to steady. Increased A-P swaying in static standing. Stand pivot, bed to recliner  Ambulation/Gait             General Gait Details: Deferred on today 2* fall risk/safety/drop in BP/pt fatigue  Stairs            Wheelchair Mobility    Modified Rankin (Stroke Patients Only)       Balance Overall balance assessment: Needs assistance         Standing balance support: No upper extremity supported Standing balance-Leahy Scale: Fair                               Pertinent Vitals/Pain Pain  Assessment: No/denies pain    Home Living Family/patient expects to be discharged to:: Private residence Living Arrangements: Alone             Home Equipment: None      Prior Function Level of Independence: Independent               Hand Dominance        Extremity/Trunk Assessment   Upper Extremity Assessment Upper Extremity Assessment: Generalized weakness    Lower Extremity Assessment Lower Extremity Assessment: Generalized weakness    Cervical / Trunk Assessment Cervical / Trunk Assessment: Normal  Communication   Communication: No difficulties  Cognition Arousal/Alertness: Awake/alert Behavior During Therapy: WFL for tasks assessed/performed Overall Cognitive Status: Within Functional Limits for tasks assessed                                        General Comments      Exercises     Assessment/Plan    PT Assessment Patient needs continued PT services  PT Problem List Decreased strength;Decreased mobility;Decreased safety awareness;Decreased activity tolerance;Decreased balance;Decreased knowledge of use of DME       PT Treatment Interventions DME instruction;Gait training;Therapeutic activities;Therapeutic exercise;Patient/family education;Balance training;Functional mobility training    PT Goals (Current goals can be found in the Care Plan section)  Acute Rehab PT Goals Patient Stated  Goal: build endurance PT Goal Formulation: With patient Time For Goal Achievement: 03/15/19 Potential to Achieve Goals: Good    Frequency Min 3X/week   Barriers to discharge        Co-evaluation               AM-PAC PT "6 Clicks" Mobility  Outcome Measure Help needed turning from your back to your side while in a flat bed without using bedrails?: A Little Help needed moving from lying on your back to sitting on the side of a flat bed without using bedrails?: A Little Help needed moving to and from a bed to a chair (including a  wheelchair)?: A Little Help needed standing up from a chair using your arms (e.g., wheelchair or bedside chair)?: A Little Help needed to walk in hospital room?: A Lot Help needed climbing 3-5 steps with a railing? : A Lot 6 Click Score: 16    End of Session Equipment Utilized During Treatment: Oxygen Activity Tolerance: Patient limited by fatigue Patient left: in bed;with call bell/phone within reach;with chair alarm set   PT Visit Diagnosis: Unsteadiness on feet (R26.81);Muscle weakness (generalized) (M62.81);Difficulty in walking, not elsewhere classified (R26.2)    Time: 7824-2353 PT Time Calculation (min) (ACUTE ONLY): 29 min   Charges:   PT Evaluation $PT Eval Moderate Complexity: 1 Mod PT Treatments $Therapeutic Activity: 8-22 mins         Doreatha Massed, PT Acute Rehabilitation

## 2019-03-01 NOTE — Progress Notes (Signed)
Patient up to chair with assistance of RN and Physical therapist. BP in bed 129/67. After sitting to side of bed BP dropped to 84/27. Retook BP approximately three minutes later and increased to 118/59. Pt ambulated to chair- Current BP 140/71. Will continue to monitor.

## 2019-03-01 NOTE — Progress Notes (Signed)
[name] Progress Note  Subjective: BP's were high earlier today but are now in normal range. UOP yest 1500 cc, creat up to 4.27 today.  BUN 64.  K 4.4 and CO2 21. Weights are down from admit and stable.   Vitals:   03/01/19 1500 03/01/19 1549 03/01/19 1600 03/01/19 1700  BP: 122/68  140/74 (!) 117/52  Pulse: 81  78 83  Resp: 12  (!) 21 16  Temp:  98.1 F (36.7 C)    TempSrc:  Oral    SpO2: 91%  96% 93%  Weight:      Height:        Exam: Gen alert, nauseous, no distress, lying flat No jvd or bruits Chest dec'd at bases, no rales or rhonchi RRR no MRG Abd soft ntnd no mass or ascites +bs obese Ext no LE edema, +bilat hand edema Neuro is alert, Ox 3 , nf    Home meds:  - diltiazem 180 qd/ furosemide 40 bid/ lisinopril 40 qd  - insulin glargine 40 bid/ lispro 15 tid ac  - sucralfate 1gm qid/ pantoprazole 40 qd/ metoclopramide 10 tid  - atorvastatin 20 qd  - duloxetine 60 qd/ gabapentin 300 hs  - terbinafine 250 qd     UA 1/12 > 0-5 wbc/ rbc, >300 prot, 5 ketones, >500 glucose   Creat 2019 1.75- 2.49   April 2020 creat 1.72   CXR 1/15 - no active disease   Assessment/ Plan: 1. AKI on CKD 3 - baseline creat 1.8- 2.5. F/b Dr Carolin Sicks, get records on Monday. Got IV lasix and BP's dropped low 1/15, now are back up. Creat rising, will start hypotonic cautious IVF's. No indication for RRT yet.   2. COVID + pna - CXR neg, nasal O2, no distress 3. HTN urgency - sp IV clevidipine then IV labetalol, now is off IV drips. norvasc started. BP's good.  4. DM2 - longstanding 5. Gastroparesis - sig issue, pt has bad DM w/ multiple complications and Q3E 09.2     [name] 03/01/2019, 6:43 PM  Inpatient medications: . amLODipine  10 mg Oral Daily  . atorvastatin  20 mg Oral Daily  . Chlorhexidine Gluconate Cloth  6 each Topical Q0600  . dexamethasone (DECADRON) injection  6 mg Intravenous Q24H  . DULoxetine  60 mg Oral Daily  . gabapentin  300 mg Oral QHS   . heparin injection (subcutaneous)  10,000 Units Subcutaneous Q8H  . insulin aspart  0-9 Units Subcutaneous Q4H  . insulin glargine  30 Units Subcutaneous BID  . mouth rinse  15 mL Mouth Rinse BID  . metoCLOPramide (REGLAN) injection  10 mg Intravenous Q6H  . pantoprazole (PROTONIX) IV  40 mg Intravenous Q12H  . sucralfate  1 g Oral QID  . traZODone  50 mg Oral QHS   . sodium chloride 10 mL/hr at 03/01/19 1503  . sodium chloride 5 mL/hr at 02/28/19 1700  . erythromycin 250 mg (03/01/19 1808)  . labetalol (NORMODYNE) infusion 1 mg/min (02/28/19 1109)   sodium chloride, sodium chloride, alum & mag hydroxide-simeth, hydrALAZINE, morphine injection, promethazine, traMADol

## 2019-03-02 LAB — GLUCOSE, CAPILLARY
Glucose-Capillary: 227 mg/dL — ABNORMAL HIGH (ref 70–99)
Glucose-Capillary: 173 mg/dL — ABNORMAL HIGH (ref 70–99)
Glucose-Capillary: 124 mg/dL — ABNORMAL HIGH (ref 70–99)
Glucose-Capillary: 54 mg/dL — ABNORMAL LOW (ref 70–99)
Glucose-Capillary: 56 mg/dL — ABNORMAL LOW (ref 70–99)
Glucose-Capillary: 114 mg/dL — ABNORMAL HIGH (ref 70–99)
Glucose-Capillary: 60 mg/dL — ABNORMAL LOW (ref 70–99)
Glucose-Capillary: 99 mg/dL (ref 70–99)
Glucose-Capillary: 108 mg/dL — ABNORMAL HIGH (ref 70–99)

## 2019-03-02 LAB — C-REACTIVE PROTEIN: CRP: 2.7 mg/dL — ABNORMAL HIGH (ref <1.0)

## 2019-03-02 LAB — RENAL FUNCTION PANEL
Albumin: 2.7 g/dL — ABNORMAL LOW (ref 3.5–5.0)
Anion gap: 11 (ref 5–15)
BUN: 72 mg/dL — ABNORMAL HIGH (ref 6–20)
CO2: 19 mmol/L — ABNORMAL LOW (ref 22–32)
Calcium: 8.2 mg/dL — ABNORMAL LOW (ref 8.9–10.3)
Chloride: 111 mmol/L (ref 98–111)
Creatinine, Ser: 4.03 mg/dL — ABNORMAL HIGH (ref 0.61–1.24)
GFR calc Af Amer: 20 mL/min — ABNORMAL LOW (ref >60)
GFR calc non Af Amer: 17 mL/min — ABNORMAL LOW (ref >60)
Glucose, Bld: 261 mg/dL — ABNORMAL HIGH (ref 70–99)
Phosphorus: 5.7 mg/dL — ABNORMAL HIGH (ref 2.5–4.6)
Potassium: 4.6 mmol/L (ref 3.5–5.1)
Sodium: 141 mmol/L (ref 135–145)

## 2019-03-02 LAB — D-DIMER, QUANTITATIVE: D-Dimer, Quant: 6.86 ug/mL-FEU — ABNORMAL HIGH (ref 0.00–0.50)

## 2019-03-02 MED ORDER — LABETALOL HCL 200 MG PO TABS
200.0000 mg | ORAL_TABLET | Freq: Two times a day (BID) | ORAL | Status: DC
  Administered 2019-03-02 – 2019-03-04 (×5): 200 mg via ORAL
  Filled 2019-03-02 (×5): qty 1

## 2019-03-02 MED ORDER — POLYETHYLENE GLYCOL 3350 17 G PO PACK
17.0000 g | PACK | Freq: Two times a day (BID) | ORAL | Status: DC | PRN
  Administered 2019-03-02: 17 g via ORAL
  Filled 2019-03-02: qty 1

## 2019-03-02 NOTE — Progress Notes (Signed)
[facility] PROGRESS NOTE    Progress Note  Teng Decou  WEX:[id number] DOB: May 06, 1975 DOA: 02/24/2019 PCP: Charlott Rakes, MD     Brief Narrative:   Duane [name] is an 44 y.o. male past medical history significant for chronic diarrhea, kidney disease stage III, depression diabetes mellitus type 1 with peripheral neuropathy, diabetic gastroparesis and diabetic neuropathy, morbid obesity who returns to the emergency room for the second time in the last 2 days with multiple episodes of nausea vomiting about 30 and a 24-hour period, with mild epigastric pain.  ED was found with a pulse of 100 blood pressure 183/116.  Status post convalescent plasma on 02/25/2019  Assessment/Plan:   Acute respiratory failure with hypoxia secondary to pneumonia due to COVID-19: Mr. Chana Bode has been weaned to room air satting greater than 94%. The patient has not been prone, continue spirometry and flutter valve. Has completed a course of IV remdesivir and steroids, once he has been wean to room air for greater than 24 hours we will discontinue steroids. His inflammatory markers are improving. Patient can be transfer to med surg.  Acute nausea and vomiting due to diabetes gastroparesis: Patient is at minimal dry heaving, he is tolerated clear liquid diet.  Continue IV Reglan DC erythromycin. Renal started him on hypotonic IV fluids.  New Hypertensive urgency/essential hypertension: Blood pressure is improved today, he is off IV labetalol He is currently on amlodipine, will start him on oral labetalol. His blood pressure was significantly elevated several days ago, question likely due to abdominal pain , also the differential includes withdrawing from narcotics.  Acute kidney injury on chronic kidney disease stage IIIb: With a baseline creatinine of 1.5-2, in the setting of ACE inhibitor, nephrology was consulted, he is now on hypotonic solution. Continue strict I's and O's and daily weights,  appreciate renal's assistance.  Narcotic seeking behavior: Patient overnight came complaining of abdominal pain he was started on IV morphine. His pain seems to be controlled.  New mild hypernatremia: Resolved with hypotonic solution.  Hyperkalemia: Resolved with oral Kayexalate.  Diabetes mellitus type 1 uncontrolled: Continue long-acting insulin plus sliding scale.  Depression Resume Cymbalta.  DVT prophylaxis: lovenox Family Communication:none Disposition Plan/Barrier to D/C: He came from home and can go back home once he is on his oral antihypertensive medication and his creatinine has returned to baseline. Code Status:     Code Status Orders  (From admission, onward)         Start     Ordered   02/24/19 0932  Full code  Continuous     02/24/19 0933        Code Status History    Date Active Date Inactive Code Status Order ID Comments User Context   12/21/2017 0857 12/24/2017 1906 Full Code [id number]  Shelly Coss, MD ED   10/16/2015 0142 10/17/2015 1904 Full Code [id number]  Norval Morton, MD ED   01/01/2015 0554 01/05/2015 2043 Full Code [id number]  Etta Quill, DO ED   06/27/2014 0910 07/01/2014 2217 Full Code [id number]  Thurnell Lose, MD Inpatient   05/11/2014 1116 05/24/2014 2005 Full Code [id number]  [name], MD Inpatient   05/05/2014 0545 05/06/2014 1856 Full Code [id number]  Ivor Costa, MD Inpatient   04/30/2014 0705 05/03/2014 1632 Full Code [id number]  Lavina Hamman, MD ED   02/16/2014 0528 02/18/2014 2031 Full Code [id number]  Etta Quill, DO ED   02/03/2014 1004 02/05/2014 2130 Full Code [id number]  Thurnell Lose, MD  Inpatient   12/31/2013 0341 01/10/2014 1714 Full Code [id number]  Berle Mull, MD Inpatient   10/14/2013 1603 10/19/2013 1651 Full Code [id number]  [name], MD ED   09/22/2013 0302 10/06/2013 2017 Full Code [id number]  [place], DO Inpatient   08/27/2013 1532 08/29/2013 1717 Full Code [id number]  Louellen Molder, MD Inpatient    08/16/2013 2146 08/19/2013 1929 Full Code [id number]  Theressa Millard, MD ED   06/04/2013 1750 06/12/2013 2019 Full Code [id number]  Eugenie Filler, MD Inpatient   05/30/2013 2223 06/02/2013 1749 Full Code [id number]  Merton Border, MD Inpatient   01/09/2013 0450 01/10/2013 0014 Full Code [id number]  Etta Quill, DO Inpatient   Advance Care Planning Activity        IV Access:    Peripheral IV   Procedures and diagnostic studies:   DG CHEST PORT 1 VIEW  Result Date: 03/01/2019 CLINICAL DATA:  Dyspnea. EXAM: PORTABLE CHEST 1 VIEW COMPARISON:  One-view chest x-ray 02/27/2019 FINDINGS: The heart size is normal, exaggerated by low lung volumes. Mild bibasilar opacities are again seen without significant change. Upper lung fields are clear. Visualized soft tissues and bony thorax are unremarkable. IMPRESSION: Low lung volumes with mild bibasilar atelectasis. No acute cardiopulmonary disease. Electronically Signed   By: San Morelle M.D.   On: 03/01/2019 06:12     Medical Consultants:    None.  Anti-Infectives:   None  Subjective:    [name] he has no new complaints today  Objective:    Vitals:   03/02/19 0347 03/02/19 0350 03/02/19 0400 03/02/19 0500  BP:   136/69 (!) 152/80  Pulse:   87 89  Resp:   14 14  Temp: 98.2 F (36.8 C)     TempSrc: Oral     SpO2:   94% 94%  Weight:  (!) 146.4 kg    Height:       SpO2: 94 % O2 Flow Rate (L/min): 2 L/min   Intake/Output Summary (Last 24 hours) at 03/02/2019 0723 Last data filed at 03/02/2019 0500 Gross per 24 hour  Intake 1497.44 ml  Output 1700 ml  Net -202.56 ml   Filed Weights   02/28/19 1414 03/01/19 0500 03/02/19 0350  Weight: (!) 145.7 kg (!) 146.4 kg (!) 146.4 kg    Exam: General exam: In no acute distress. Respiratory system: Good air movement and clear to auscultation. Cardiovascular system: S1 & S2 heard, RRR. No JVD. Gastrointestinal system: Abdomen is nondistended, soft and nontender.    Central nervous system: Alert and oriented. No focal neurological deficits. Extremities: No pedal edema. Skin: No rashes, lesions or ulcers Psychiatry: Judgement and insight appear normal. Mood & affect appropriate.  Data Reviewed:    Labs: Basic Metabolic Panel: Recent Labs  Lab 02/27/19 0217 02/27/19 0217 02/27/19 1509 02/27/19 1509 02/28/19 0132 02/28/19 0132 03/01/19 0138 03/02/19 0155  NA 149*  --  150*  --  145  --  146* 141  K 4.4   < > 4.3   < > 4.2   < > 4.4 4.6  CL 119*  --  120*  --  117*  --  116* 111  CO2 21*  --  19*  --  17*  --  21* 19*  GLUCOSE 184*  --  202*  --  222*  --  179* 261*  BUN 50*  --  53*  --  57*  --  64* 72*  CREATININE 3.40*  --  3.59*  --  3.76*  --  4.27* 4.03*  CALCIUM 7.7*  --  7.9*  --  7.8*  --  8.1* 8.2*  PHOS  --   --   --   --   --   --  5.0* 5.7*   < > = values in this interval not displayed.   GFR Estimated Creatinine Clearance: 37.4 mL/min (A) (by C-G formula based on SCr of 4.03 mg/dL (H)). Liver Function Tests: Recent Labs  Lab 02/24/19 0542 02/25/19 0259 03/01/19 0138 03/02/19 0155  AST 25 31  --   --   ALT 25 21  --   --   ALKPHOS 94 73  --   --   BILITOT 0.5 1.1  --   --   PROT 7.8 6.4*  --   --   ALBUMIN 3.5 2.7* 2.7* 2.7*   Recent Labs  Lab 02/24/19 0542  LIPASE 22   No results for input(s): AMMONIA in the last 168 hours. Coagulation profile No results for input(s): INR, PROTIME in the last 168 hours. COVID-19 Labs  Recent Labs    02/28/19 0132 03/01/19 0138 03/02/19 0155  DDIMER 2.75* 7.95* 6.86*  CRP 6.0* 3.7* 2.7*    Lab Results  Component Value Date   SARSCOV2NAA POSITIVE (A) 02/24/2019    CBC: Recent Labs  Lab 02/24/19 0542 02/25/19 0259  WBC 7.6 9.2  NEUTROABS 6.2  --   HGB 14.0 12.4*  HCT 44.7 41.1  MCV 84.3 87.1  PLT 264 291   Cardiac Enzymes: No results for input(s): CKTOTAL, CKMB, CKMBINDEX, TROPONINI in the last 168 hours. BNP (last 3 results) No results for input(s):  PROBNP in the last 8760 hours. CBG: Recent Labs  Lab 03/01/19 1202 03/01/19 1522 03/01/19 1943 03/01/19 2335 03/02/19 0344  GLUCAP 145* 167* 257* 259* 227*   D-Dimer: Recent Labs    03/01/19 0138 03/02/19 0155  DDIMER 7.95* 6.86*   Hgb A1c: No results for input(s): HGBA1C in the last 72 hours. Lipid Profile: No results for input(s): CHOL, HDL, LDLCALC, TRIG, CHOLHDL, LDLDIRECT in the last 72 hours. Thyroid function studies: No results for input(s): TSH, T4TOTAL, T3FREE, THYROIDAB in the last 72 hours.  Invalid input(s): FREET3 Anemia work up: No results for input(s): VITAMINB12, FOLATE, FERRITIN, TIBC, IRON, RETICCTPCT in the last 72 hours. Sepsis Labs: Recent Labs  Lab 02/24/19 0542 02/25/19 0259  WBC 7.6 9.2   Microbiology Recent Results (from the past 240 hour(s))  SARS CORONAVIRUS 2 (TAT 6-24 HRS) Nasopharyngeal Nasopharyngeal Swab     Status: Abnormal   Collection Time: 02/24/19  1:12 PM   Specimen: Nasopharyngeal Swab  Result Value Ref Range Status   SARS Coronavirus 2 POSITIVE (A) NEGATIVE Final    Comment: RESULT CALLED TO, READ BACK BY AND VERIFIED WITH: S.HOEFLER,RN [phone] [id number] I.MANNING (NOTE) SARS-CoV-2 target nucleic acids are DETECTED. The SARS-CoV-2 RNA is generally detectable in upper and lower respiratory specimens during the acute phase of infection. Positive results are indicative of the presence of SARS-CoV-2 RNA. Clinical correlation with patient history and other diagnostic information is  necessary to determine patient infection status. Positive results do not rule out bacterial infection or co-infection with other viruses.  The expected result is Negative. Fact Sheet for Patients: SugarRoll.be Fact Sheet for Healthcare Providers: https://www.woods-mathews.com/ This test is not yet approved or cleared by the [place] FDA and  has been authorized for detection and/or diagnosis of SARS-CoV-2  by FDA under an Emergency Use Authorization (EUA). This EUA will  remain  in effect (meaning this test can be used) for th e duration of the COVID-19 declaration under Section 564(b)(1) of the Act, 21 U.S.C. section 360bbb-3(b)(1), unless the authorization is terminated or revoked sooner. Performed at [facility], [place] [address] [place], [place] [address]   MRSA PCR Screening     Status: None   Collection Time: 02/26/19  6:41 PM   Specimen: Nasal Mucosa; Nasopharyngeal  Result Value Ref Range Status   MRSA by PCR NEGATIVE NEGATIVE Final    Comment:        The GeneXpert MRSA Assay (FDA approved for NASAL specimens only), is one component of a comprehensive MRSA colonization surveillance program. It is not intended to diagnose MRSA infection nor to guide or monitor treatment for MRSA infections. Performed at [facility], [place] [address] [place], [place] [address]      Medications:   . amLODipine  10 mg Oral Daily  . atorvastatin  20 mg Oral Daily  . Chlorhexidine Gluconate Cloth  6 each Topical Q0600  . dexamethasone (DECADRON) injection  6 mg Intravenous Q24H  . DULoxetine  60 mg Oral Daily  . gabapentin  300 mg Oral QHS  . heparin injection (subcutaneous)  10,000 Units Subcutaneous Q8H  . insulin aspart  0-9 Units Subcutaneous Q4H  . insulin glargine  30 Units Subcutaneous BID  . mouth rinse  15 mL Mouth Rinse BID  . metoCLOPramide (REGLAN) injection  10 mg Intravenous Q6H  . pantoprazole (PROTONIX) IV  40 mg Intravenous Q12H  . sucralfate  1 g Oral QID  . traZODone  50 mg Oral QHS   Continuous Infusions: . sodium chloride 65 mL/hr at 03/02/19 0500  . sodium chloride Stopped (03/01/19 1950)  . sodium chloride 5 mL/hr at 02/28/19 1700  . erythromycin 250 mg (03/02/19 0624)  . labetalol (NORMODYNE) infusion 1 mg/min (02/28/19 1109)      LOS: 5 days   Charlynne Cousins  [facility]  03/02/2019, 7:23 AM

## 2019-03-02 NOTE — Progress Notes (Signed)
[facility] Progress Note   Home meds: - diltiazem 180 qd/ furosemide 40 bid/ lisinopril 40 qd - insulin glargine 40 bid/ lispro 15 tid ac - sucralfate 1gm qid/ pantoprazole 40 qd/ metoclopramide 10 tid - atorvastatin 20 qd - duloxetine 60 qd/ gabapentin 300 hs - terbinafine 250 qd  UA 1/12 >0-5 wbc/ rbc, >300 prot, 5 ketones, >500 glucose Creat 2019 1.75- 2.49 April 2020 creat 1.72 CXR 1/15 - no active disease  Assessment/ Plan:   1. AKI on CKD 3 - baseline creat 1.8- 2.5. F/b Dr Carolin Sicks.  Got IV lasix and BP's dropped low 1/15, now are back up. Creat was  Rising ->  hypotonic  IVF's at a low rate. No indication for RRT yet and hopefully the decrease in Cr is the beginning of a trend.  - would continue the hypotonic fluids and will reassess in AM; if HCO3 drops more will start a hypotonic solution with an amp of HCO3.   2. COVID + pna - CXR neg, nasal O2, no distress 3. HTN urgency -sp IV clevidipine then IV labetalol, now is off IV drips. norvasc started. BP's good.  4. DM2 - longstanding 5. Gastroparesis - sig issue, pt has bad DM w/ multiple complications and Y1E 56.3  Subjective:   Still has nausea and cough but breathing is comfortable. He denies dyspnea/ f/c/v.    Objective:   BP (!) 156/80   Pulse 88   Temp 98.2 F (36.8 C) (Oral)   Resp 12   Ht 6\' 4"  (1.93 m)   Wt (!) 146.4 kg   SpO2 92%   BMI 39.29 kg/m   Intake/Output Summary (Last 24 hours) at 03/02/2019 1524 Last data filed at 03/02/2019 1400 Gross per 24 hour  Intake 2163.86 ml  Output 1400 ml  Net 763.86 ml   Weight change: 0.7 kg  Physical Exam: Genalert, nauseous, no distress, lying flat No jvd or bruits Chestdec'd at bases, no rales or rhonchi RRR no MRG Abd soft ntnd no mass or ascites +bsobese Extno LE edema, +bilat hand edema Neuro is alert, Ox 3 , nf  Imaging: DG CHEST PORT 1 VIEW  Result Date: 03/01/2019 CLINICAL DATA:  Dyspnea. EXAM: PORTABLE  CHEST 1 VIEW COMPARISON:  One-view chest x-ray 02/27/2019 FINDINGS: The heart size is normal, exaggerated by low lung volumes. Mild bibasilar opacities are again seen without significant change. Upper lung fields are clear. Visualized soft tissues and bony thorax are unremarkable. IMPRESSION: Low lung volumes with mild bibasilar atelectasis. No acute cardiopulmonary disease. Electronically Signed   By: San Morelle M.D.   On: 03/01/2019 06:12    Labs: BMET Recent Labs  Lab 02/25/19 0259 02/26/19 0340 02/27/19 0217 02/27/19 1509 02/28/19 0132 03/01/19 0138 03/02/19 0155  NA 144 143 149* 150* 145 146* 141  K 5.7* 4.2 4.4 4.3 4.2 4.4 4.6  CL 112* 111 119* 120* 117* 116* 111  CO2 23 23 21* 19* 17* 21* 19*  GLUCOSE 211* 286* 184* 202* 222* 179* 261*  BUN 35* 51* 50* 53* 57* 64* 72*  CREATININE 2.98* 3.77* 3.40* 3.59* 3.76* 4.27* 4.03*  CALCIUM 8.0* 7.8* 7.7* 7.9* 7.8* 8.1* 8.2*  PHOS  --   --   --   --   --  5.0* 5.7*   CBC Recent Labs  Lab 02/24/19 0542 02/25/19 0259  WBC 7.6 9.2  NEUTROABS 6.2  --   HGB 14.0 12.4*  HCT 44.7 41.1  MCV 84.3 87.1  PLT 264 291  Medications:    . amLODipine  10 mg Oral Daily  . atorvastatin  20 mg Oral Daily  . Chlorhexidine Gluconate Cloth  6 each Topical Q0600  . DULoxetine  60 mg Oral Daily  . gabapentin  300 mg Oral QHS  . heparin injection (subcutaneous)  10,000 Units Subcutaneous Q8H  . insulin aspart  0-9 Units Subcutaneous Q4H  . insulin glargine  30 Units Subcutaneous BID  . labetalol  200 mg Oral BID  . mouth rinse  15 mL Mouth Rinse BID  . metoCLOPramide (REGLAN) injection  10 mg Intravenous Q6H  . pantoprazole (PROTONIX) IV  40 mg Intravenous Q12H  . sucralfate  1 g Oral QID  . traZODone  50 mg Oral QHS      Otelia Santee, MD 03/02/2019, 3:24 PM

## 2019-03-02 NOTE — Progress Notes (Signed)
Hypoglycemic Event  CBG: 54   Treatment: 8 oz Sprite   Symptoms: no new symptoms   Follow-up CBG: 60 Time: 1734   Possible Reasons for Event: poor appetite, still receiving Lantus   Comments/MD notified: followed hypoglycemic protocol. Follow up-CBG treated w/Sprite 8oz sprite per hypoglycemic protocol, follow up CBG 114.     [place], Eline Geng H

## 2019-03-03 LAB — RENAL FUNCTION PANEL
Albumin: 2.5 g/dL — ABNORMAL LOW (ref 3.5–5.0)
Anion gap: 7 (ref 5–15)
BUN: 63 mg/dL — ABNORMAL HIGH (ref 6–20)
CO2: 21 mmol/L — ABNORMAL LOW (ref 22–32)
Calcium: 7.9 mg/dL — ABNORMAL LOW (ref 8.9–10.3)
Chloride: 114 mmol/L — ABNORMAL HIGH (ref 98–111)
Creatinine, Ser: 3.4 mg/dL — ABNORMAL HIGH (ref 0.61–1.24)
GFR calc Af Amer: 24 mL/min — ABNORMAL LOW (ref >60)
GFR calc non Af Amer: 21 mL/min — ABNORMAL LOW (ref >60)
Glucose, Bld: 101 mg/dL — ABNORMAL HIGH (ref 70–99)
Phosphorus: 5.2 mg/dL — ABNORMAL HIGH (ref 2.5–4.6)
Potassium: 3.8 mmol/L (ref 3.5–5.1)
Sodium: 142 mmol/L (ref 135–145)

## 2019-03-03 LAB — C-REACTIVE PROTEIN: CRP: 2.1 mg/dL — ABNORMAL HIGH (ref <1.0)

## 2019-03-03 LAB — GLUCOSE, CAPILLARY
Glucose-Capillary: 85 mg/dL (ref 70–99)
Glucose-Capillary: 61 mg/dL — ABNORMAL LOW (ref 70–99)
Glucose-Capillary: 134 mg/dL — ABNORMAL HIGH (ref 70–99)
Glucose-Capillary: 152 mg/dL — ABNORMAL HIGH (ref 70–99)
Glucose-Capillary: 143 mg/dL — ABNORMAL HIGH (ref 70–99)

## 2019-03-03 LAB — D-DIMER, QUANTITATIVE: D-Dimer, Quant: 2.94 ug/mL-FEU — ABNORMAL HIGH (ref 0.00–0.50)

## 2019-03-03 MED ORDER — PANTOPRAZOLE SODIUM 40 MG PO TBEC
40.0000 mg | DELAYED_RELEASE_TABLET | Freq: Two times a day (BID) | ORAL | Status: DC
  Administered 2019-03-03 – 2019-03-04 (×2): 40 mg via ORAL
  Filled 2019-03-03 (×2): qty 1

## 2019-03-03 MED ORDER — LIP MEDEX EX OINT
TOPICAL_OINTMENT | CUTANEOUS | Status: DC | PRN
  Filled 2019-03-03: qty 7

## 2019-03-03 NOTE — Evaluation (Signed)
Occupational Therapy Evaluation Patient Details Name: Duane [name] MRN: [id number] DOB: [birth year] Today's Date: 03/03/2019    History of Present Illness 44 yo male admitted with COVID Pna, AKI, gastroparesis, orthostatic hypotension. Hx of DM, neuropathy, CKD, OA, NM d/o, MDD, BPPV, obesity   Clinical Impression   This 44 year old man was admitted for the above. At baseline, he lives alone and is independent.    He has a h/o orthostatic hypotension, and this limited his evaluation today.  He needed only min guard to stand, but +2 safety as he became less verbally responsive.  He needs min A for transfers and LB adls. Will follow in acute setting with supervision/set up goals.      Follow Up Recommendations  Home health OT;Supervision/Assistance - 24 hour(depending upon progress)    Equipment Recommendations  (TBA further (pt is 6'4"))    Recommendations for Other Services       Precautions / Restrictions Precautions Precautions: Fall Precaution Comments: orthostatic hypotension Restrictions Weight Bearing Restrictions: No      Mobility Bed Mobility Overal bed mobility: Needs Assistance Bed Mobility: Supine to Sit     Supine to sit: Supervision;HOB elevated     General bed mobility comments: for safety  Transfers Overall transfer level: Needs assistance Equipment used: Rolling walker (2 wheeled) Transfers: Sit to/from [facility] Sit to Stand: Min guard;+2 safety/equipment;+2 physical assistance Stand pivot transfers: Min assist;Min guard;+2 physical assistance;+2 safety/equipment;From elevated surface       General transfer comment: sit to stand x2 d/t dizziness. min-min/guard for safety,  Increased A-P swaying in static standing. Stand pivot, bed to recliner; BP supine 130/73, sitting (after standing x 30sec) 110/62    Balance                                           ADL either performed or assessed with clinical judgement    ADL Overall ADL's : Needs assistance/impaired Eating/Feeding: Independent   Grooming: Set up   Upper Body Bathing: Set up   Lower Body Bathing: Min guard   Upper Body Dressing : Minimal assistance(lines)   Lower Body Dressing: Minimal assistance;+2 for safety/equipment   Toilet Transfer: Minimal assistance;RW(chair)   Toileting- Clothing Manipulation and Hygiene: Min guard;+2 for safety/equipment         General ADL Comments: from elevated bed. While pt did not need physical A to stand, he needs +2 safety due to orthostatic hypotension. Pt less verbally responsive in standing:  could not get BP there.       Vision         Perception     Praxis      Pertinent Vitals/Pain Pain Assessment: No/denies pain     Hand Dominance     Extremity/Trunk Assessment Upper Extremity Assessment Upper Extremity Assessment: Overall WFL for tasks assessed(bil UE edema; encouraged AROM/positioning)           Communication Communication Communication: No difficulties   Cognition Arousal/Alertness: Awake/alert Behavior During Therapy: WFL for tasks assessed/performed Overall Cognitive Status: Within Functional Limits for tasks assessed                                 General Comments: pt with decr responsiveness in standing however likely related to decr BP   General Comments  O2 off upon entering  room. SpO2= 95-98%, left O2 off with sats 89-92%, encouraged use of IS,  RN made aware.  BP  initially 130/73, could not get in standing. When pt sat back on bed, BP 110/62    Exercises General Exercises - Lower Extremity Ankle Circles/Pumps: AROM;Both;10 reps Quad Sets: AROM;Both;5 reps   Shoulder Instructions      Home Living Family/patient expects to be discharged to:: Private residence Living Arrangements: Alone                           Home Equipment: None          Prior Functioning/Environment Level of Independence: Independent                  OT Problem List: Decreased activity tolerance;Impaired balance (sitting and/or standing);Cardiopulmonary status limiting activity;Decreased knowledge of use of DME or AE;Increased edema      OT Treatment/Interventions: Self-care/ADL training;Energy conservation;DME and/or AE instruction;Therapeutic activities;Patient/family education;Therapeutic exercise    OT Goals(Current goals can be found in the care plan section) Acute Rehab OT Goals Patient Stated Goal: build endurance OT Goal Formulation: With patient Time For Goal Achievement: 03/17/19 Potential to Achieve Goals: Good ADL Goals Pt Will Perform Grooming: standing;with set-up Pt Will Transfer to Toilet: with supervision;ambulating;bedside commode Pt Will Perform Toileting - Clothing Manipulation and hygiene: with modified independence;sit to/from stand Additional ADL Goal #1: pt will perform ADL at set up level, and he will initiate at least one rest break for energy conservation, initiate sitting if dizzy without cues  OT Frequency: Min 2X/week   Barriers to D/C:            Co-evaluation PT/OT/SLP Co-Evaluation/Treatment: Yes Reason for Co-Treatment: For patient/therapist safety PT goals addressed during session: Mobility/safety with mobility OT goals addressed during session: ADL's and self-care      AM-PAC OT "6 Clicks" Daily Activity     Outcome Measure Help from another person eating meals?: None Help from another person taking care of personal grooming?: A Little Help from another person toileting, which includes using toliet, bedpan, or urinal?: A Little Help from another person bathing (including washing, rinsing, drying)?: A Little Help from another person to put on and taking off regular upper body clothing?: A Little Help from another person to put on and taking off regular lower body clothing?: A Little 6 Click Score: 19   End of Session Nurse Communication: (BP)  Activity Tolerance:  Treatment limited secondary to medical complications (Comment) Patient left: in chair;with call bell/phone within reach;with chair alarm set  OT Visit Diagnosis: Other abnormalities of gait and mobility (R26.89)                Time: 5003-7048 OT Time Calculation (min): 25 min Charges:  OT General Charges $OT Visit: 1 Visit OT Evaluation $OT Eval Moderate Complexity: 1 Mod  Omni Dunsworth S, OTR/L Acute Rehabilitation Services 03/03/2019  Raedyn Wenke 03/03/2019, 1:23 PM

## 2019-03-03 NOTE — Progress Notes (Signed)
[facility] PROGRESS NOTE    Progress Note  Duane [name]  KVQ:[id number] DOB: 22-Feb-1975 DOA: 02/24/2019 PCP: Charlott Rakes, MD     Brief Narrative:   Duane [name] is an 44 y.o. male past medical history significant for chronic diarrhea, kidney disease stage III, depression diabetes mellitus type 1 with peripheral neuropathy, diabetic gastroparesis and diabetic neuropathy, morbid obesity who returns to the emergency room for the second time in the last 2 days with multiple episodes of nausea vomiting about 30 and a 24-hour period, with mild epigastric pain.  ED was found with a pulse of 100 blood pressure 183/116.  Status post convalescent plasma on 02/25/2019  Assessment/Plan:   Acute respiratory failure with hypoxia secondary to pneumonia due to COVID-19: Duane [name] has now been weaned to room air satting greater than 94%. He refuses to prone, he refuses to use incentive spirometry and flutter valve. He has completed his course of IV remdesivir will discontinue dexamethasone a and he is no longer requiring oxygen. Keep in isolation, transfer patient to [place].  Acute nausea and vomiting due to diabetes gastroparesis: Currently on IV Reglan, IV Protonix and sucralfate. Has been tolerating clear liquid diet, will advance to a full liquid diet this morning. Continue to advance as tolerated.  New Hypertensive urgency/essential hypertension: Blood pressure is improved today, he is currently on amlodipine and labetalol.  Acute kidney injury on chronic kidney disease stage IIIb: With a baseline Creatinine of 1.5-2 in the setting of ACE inhibitor use.  Initially despite IV fluid hydration his creatinine continue to rise he was started on IV Lasix with further rise in his creatinine Nephrology has been consulted recommended to start on gentle IV fluid hydration and his creatinine slowly improve.  Narcotic seeking behavior: Patient overnight came complaining of abdominal pain  he was started on IV morphine. His pain seems to be controlled.  New mild hypernatremia: Resolved with hypotonic solution.  Hyperkalemia: Resolved with oral Kayexalate.  Diabetes mellitus type 1 uncontrolled: Continue long-acting insulin plus sliding scale.  Depression Resume Cymbalta.  DVT prophylaxis: lovenox Family Communication:none Disposition Plan/Barrier to D/C: He came from home and can go back home once he is on his oral antihypertensive medication and renal agrees with discharge. Code Status:     Code Status Orders  (From admission, onward)         Start     Ordered   02/24/19 0932  Full code  Continuous     02/24/19 0933        Code Status History    Date Active Date Inactive Code Status Order ID Comments User Context   12/21/2017 0857 12/24/2017 1906 Full Code [id number]  Shelly Coss, MD ED   10/16/2015 0142 10/17/2015 1904 Full Code [id number]  Norval Morton, MD ED   01/01/2015 0554 01/05/2015 2043 Full Code [id number]  Etta Quill, DO ED   06/27/2014 0910 07/01/2014 2217 Full Code [id number]  Thurnell Lose, MD Inpatient   05/11/2014 1116 05/24/2014 2005 Full Code [id number]  [name], MD Inpatient   05/05/2014 0545 05/06/2014 1856 Full Code [id number]  Ivor Costa, MD Inpatient   04/30/2014 0705 05/03/2014 1632 Full Code [id number]  Lavina Hamman, MD ED   02/16/2014 0528 02/18/2014 2031 Full Code [id number]  Etta Quill, DO ED   02/03/2014 1004 02/05/2014 2130 Full Code [id number]  Thurnell Lose, MD Inpatient   12/31/2013 0341 01/10/2014 1714 Full Code [id number]  Berle Mull, MD Inpatient   10/14/2013 1603  10/19/2013 1651 Full Code [id number]  [name], MD ED   09/22/2013 0302 10/06/2013 2017 Full Code [id number]  Ester, [place], DO Inpatient   08/27/2013 1532 08/29/2013 1717 Full Code [id number]  Louellen Molder, MD Inpatient   08/16/2013 2146 08/19/2013 1929 Full Code [id number]  Theressa Millard, MD ED   06/04/2013 1750 06/12/2013 2019 Full Code  [id number]  Eugenie Filler, MD Inpatient   05/30/2013 2223 06/02/2013 1749 Full Code [id number]  Merton Border, MD Inpatient   01/09/2013 0450 01/10/2013 0014 Full Code [id number]  Etta Quill, DO Inpatient   Advance Care Planning Activity        IV Access:    Peripheral IV   Procedures and diagnostic studies:   No results found.   Medical Consultants:    None.  Anti-Infectives:   None  Subjective:    Duane [name] no new complaints, he relates he has not vomited today.  Objective:    Vitals:   03/03/19 0400 03/03/19 0500 03/03/19 0600 03/03/19 0700  BP: (!) 148/81 (!) 151/87 137/72 135/71  Pulse: 92 92 84 82  Resp: 18 19 19 11   Temp:      TempSrc:      SpO2: 93% 96% 93% 92%  Weight:      Height:       SpO2: 92 % O2 Flow Rate (L/min): 2 L/min   Intake/Output Summary (Last 24 hours) at 03/03/2019 0740 Last data filed at 03/03/2019 0400 Gross per 24 hour  Intake 3817.46 ml  Output 2125 ml  Net 1692.46 ml   Filed Weights   02/28/19 1414 03/01/19 0500 03/02/19 0350  Weight: (!) 145.7 kg (!) 146.4 kg (!) 146.4 kg    Exam: General exam: In no acute distress. Respiratory system: Good air movement and clear to auscultation. Cardiovascular system: S1 & S2 heard, RRR. No JVD. Gastrointestinal system: Abdomen is nondistended, soft and nontender.  Extremities: No pedal edema. Skin: No rashes, lesions or ulcers   Data Reviewed:    Labs: Basic Metabolic Panel: Recent Labs  Lab 02/27/19 1509 02/27/19 1509 02/28/19 0132 02/28/19 0132 03/01/19 0138 03/01/19 0138 03/02/19 0155 03/03/19 0144  NA 150*  --  145  --  146*  --  141 142  K 4.3   < > 4.2   < > 4.4   < > 4.6 3.8  CL 120*  --  117*  --  116*  --  111 114*  CO2 19*  --  17*  --  21*  --  19* 21*  GLUCOSE 202*  --  222*  --  179*  --  261* 101*  BUN 53*  --  57*  --  64*  --  72* 63*  CREATININE 3.59*  --  3.76*  --  4.27*  --  4.03* 3.40*  CALCIUM 7.9*  --  7.8*  --  8.1*  --  8.2*  7.9*  PHOS  --   --   --   --  5.0*  --  5.7* 5.2*   < > = values in this interval not displayed.   GFR Estimated Creatinine Clearance: 44.3 mL/min (A) (by C-G formula based on SCr of 3.4 mg/dL (H)). Liver Function Tests: Recent Labs  Lab 02/25/19 0259 03/01/19 0138 03/02/19 0155 03/03/19 0144  AST 31  --   --   --   ALT 21  --   --   --   ALKPHOS 73  --   --   --  BILITOT 1.1  --   --   --   PROT 6.4*  --   --   --   ALBUMIN 2.7* 2.7* 2.7* 2.5*   No results for input(s): LIPASE, AMYLASE in the last 168 hours. No results for input(s): AMMONIA in the last 168 hours. Coagulation profile No results for input(s): INR, PROTIME in the last 168 hours. COVID-19 Labs  Recent Labs    03/01/19 0138 03/02/19 0155 03/03/19 0144  DDIMER 7.95* 6.86* 2.94*  CRP 3.7* 2.7* 2.1*    Lab Results  Component Value Date   SARSCOV2NAA POSITIVE (A) 02/24/2019    CBC: Recent Labs  Lab 02/25/19 0259  WBC 9.2  HGB 12.4*  HCT 41.1  MCV 87.1  PLT 291   Cardiac Enzymes: No results for input(s): CKTOTAL, CKMB, CKMBINDEX, TROPONINI in the last 168 hours. BNP (last 3 results) No results for input(s): PROBNP in the last 8760 hours. CBG: Recent Labs  Lab 03/02/19 1734 03/02/19 1816 03/02/19 1942 03/02/19 2321 03/03/19 0334  GLUCAP 60* 114* 99 108* 85   D-Dimer: Recent Labs    03/02/19 0155 03/03/19 0144  DDIMER 6.86* 2.94*   Hgb A1c: No results for input(s): HGBA1C in the last 72 hours. Lipid Profile: No results for input(s): CHOL, HDL, LDLCALC, TRIG, CHOLHDL, LDLDIRECT in the last 72 hours. Thyroid function studies: No results for input(s): TSH, T4TOTAL, T3FREE, THYROIDAB in the last 72 hours.  Invalid input(s): FREET3 Anemia work up: No results for input(s): VITAMINB12, FOLATE, FERRITIN, TIBC, IRON, RETICCTPCT in the last 72 hours. Sepsis Labs: Recent Labs  Lab 02/25/19 0259  WBC 9.2   Microbiology Recent Results (from the past 240 hour(s))  SARS CORONAVIRUS 2  (TAT 6-24 HRS) Nasopharyngeal Nasopharyngeal Swab     Status: Abnormal   Collection Time: 02/24/19  1:12 PM   Specimen: Nasopharyngeal Swab  Result Value Ref Range Status   SARS Coronavirus 2 POSITIVE (A) NEGATIVE Final    Comment: RESULT CALLED TO, READ BACK BY AND VERIFIED WITH: [place] [id number] I.MANNING (NOTE) SARS-CoV-2 target nucleic acids are DETECTED. The SARS-CoV-2 RNA is generally detectable in upper and lower respiratory specimens during the acute phase of infection. Positive results are indicative of the presence of SARS-CoV-2 RNA. Clinical correlation with patient history and other diagnostic information is  necessary to determine patient infection status. Positive results do not rule out bacterial infection or co-infection with other viruses.  The expected result is Negative. Fact Sheet for Patients: SugarRoll.be Fact Sheet for Healthcare Providers: https://www.woods-mathews.com/ This test is not yet approved or cleared by the [place] FDA and  has been authorized for detection and/or diagnosis of SARS-CoV-2 by FDA under an Emergency Use Authorization (EUA). This EUA will remain  in effect (meaning this test can be used) for th e duration of the COVID-19 declaration under Section 564(b)(1) of the Act, 21 U.S.C. section 360bbb-3(b)(1), unless the authorization is terminated or revoked sooner. Performed at [facility], [place] [address], [place], [place] [address]   MRSA PCR Screening     Status: None   Collection Time: 02/26/19  6:41 PM   Specimen: Nasal Mucosa; Nasopharyngeal  Result Value Ref Range Status   MRSA by PCR NEGATIVE NEGATIVE Final    Comment:        The GeneXpert MRSA Assay (FDA approved for NASAL specimens only), is one component of a comprehensive MRSA colonization surveillance program. It is not intended to diagnose MRSA infection nor to guide or monitor treatment  for MRSA  infections. Performed at [facility], [place] [address], [place], [place] [address]      Medications:   . amLODipine  10 mg Oral Daily  . atorvastatin  20 mg Oral Daily  . Chlorhexidine Gluconate Cloth  6 each Topical Q0600  . DULoxetine  60 mg Oral Daily  . gabapentin  300 mg Oral QHS  . heparin injection (subcutaneous)  10,000 Units Subcutaneous Q8H  . insulin aspart  0-9 Units Subcutaneous Q4H  . insulin glargine  30 Units Subcutaneous BID  . labetalol  200 mg Oral BID  . mouth rinse  15 mL Mouth Rinse BID  . metoCLOPramide (REGLAN) injection  10 mg Intravenous Q6H  . pantoprazole (PROTONIX) IV  40 mg Intravenous Q12H  . sucralfate  1 g Oral QID  . traZODone  50 mg Oral QHS   Continuous Infusions: . sodium chloride 65 mL/hr at 03/02/19 2213  . sodium chloride Stopped (03/01/19 1950)  . sodium chloride Stopped (03/01/19 0007)      LOS: 6 days   Charlynne Cousins  [facility]  03/03/2019, 7:40 AM

## 2019-03-03 NOTE — Progress Notes (Signed)
[facility] Progress Note   Home meds: - diltiazem 180 qd/ furosemide 40 bid/ lisinopril 40 qd - insulin glargine 40 bid/ lispro 15 tid ac - sucralfate 1gm qid/ pantoprazole 40 qd/ metoclopramide 10 tid - atorvastatin 20 qd - duloxetine 60 qd/ gabapentin 300 hs - terbinafine 250 qd  UA 1/12 >0-5 wbc/ rbc, >300 prot, 5 ketones, >500 glucose Creat 2019 1.75- 2.49 April 2020 creat 1.72 CXR 1/15 - no active disease  Assessment/ Plan:   1. AKI on CKD 3 - baseline creat 1.8- 2.5. F/b Dr Carolin Sicks. Got IV lasix and BP's dropped low 1/15, now are back up. Creat was  Rising ->  hypotonic  IVF's at a low rate. No indication for RRT yet and hopefully the decrease in Cr is the beginning of a trend.   - Creatinine has been slowly decreasing. - Will sign off at this time; please reconsult if needed. Pt will need to f/u with Dr. Carolin Sicks ([facility]) 62mth after d/c.  2. COVID + pna - CXR neg, off O2, no distress 2. HTN urgency -sp IV clevidipine then IV labetalol, now is off IVdrips. norvasc started. BP's good. 3. DM2 - longstanding 4. Gastroparesis - sig issue, pt has bad DM w/ multiple complications and C1U 38.4  Subjective:   Some dyspnea getting up out of bed but overall feeling better. Denies f/cn/v. NP cough.   Objective:   BP 130/73   Pulse 81   Temp 98.3 F (36.8 C) (Oral)   Resp 15   Ht 6\' 4"  (1.93 m)   Wt (!) 146.4 kg   SpO2 93%   BMI 39.29 kg/m   Intake/Output Summary (Last 24 hours) at 03/03/2019 1348 Last data filed at 03/03/2019 0400 Gross per 24 hour  Intake 2663.7 ml  Output 2125 ml  Net 538.7 ml   Weight change:   Physical Exam: Genalert, nauseous, no distress, lying flat No jvd or bruits Chestdec'd at bases, no rales or rhonchi RRR no MRG Abd soft ntnd no mass or ascites +bsobese Extno LE edema, +bilat hand edema Neuro is alert, Ox 3 , nf  Imaging: No results found.  Labs: BMET Recent Labs  Lab 02/26/19 0340  02/27/19 0217 02/27/19 1509 02/28/19 0132 03/01/19 0138 03/02/19 0155 03/03/19 0144  NA 143 149* 150* 145 146* 141 142  K 4.2 4.4 4.3 4.2 4.4 4.6 3.8  CL 111 119* 120* 117* 116* 111 114*  CO2 23 21* 19* 17* 21* 19* 21*  GLUCOSE 286* 184* 202* 222* 179* 261* 101*  BUN 51* 50* 53* 57* 64* 72* 63*  CREATININE 3.77* 3.40* 3.59* 3.76* 4.27* 4.03* 3.40*  CALCIUM 7.8* 7.7* 7.9* 7.8* 8.1* 8.2* 7.9*  PHOS  --   --   --   --  5.0* 5.7* 5.2*   CBC Recent Labs  Lab 02/25/19 0259  WBC 9.2  HGB 12.4*  HCT 41.1  MCV 87.1  PLT 291    Medications:    . amLODipine  10 mg Oral Daily  . atorvastatin  20 mg Oral Daily  . Chlorhexidine Gluconate Cloth  6 each Topical Q0600  . DULoxetine  60 mg Oral Daily  . gabapentin  300 mg Oral QHS  . heparin injection (subcutaneous)  10,000 Units Subcutaneous Q8H  . insulin aspart  0-9 Units Subcutaneous Q4H  . insulin glargine  30 Units Subcutaneous BID  . labetalol  200 mg Oral BID  . mouth rinse  15 mL Mouth Rinse BID  . metoCLOPramide (  REGLAN) injection  10 mg Intravenous Q6H  . pantoprazole  40 mg Oral BID  . sucralfate  1 g Oral QID  . traZODone  50 mg Oral QHS      Otelia Santee, MD 03/03/2019, 1:48 PM

## 2019-03-03 NOTE — Progress Notes (Signed)
Inpatient Diabetes Program Recommendations  AACE/ADA: New Consensus Statement on Inpatient Glycemic Control (2015)  Target Ranges:  Prepandial:   less than 140 mg/dL      Peak postprandial:   less than 180 mg/dL (1-2 hours)      Critically ill patients:  140 - 180 mg/dL   Lab Results  Component Value Date   GLUCAP 61 (L) 03/03/2019   HGBA1C 11.2 (H) 02/24/2019    Review of Glycemic Control  Hypoglycemia x past 2 days. Needs insulin adjustment.  Inpatient Diabetes Program Recommendations:    Decrease Lantus to 24 units bid  Will continue to follow closely.  Thank you. Lorenda Peck, RD, LDN, CDE Inpatient Diabetes Coordinator [phone]

## 2019-03-03 NOTE — Progress Notes (Signed)
Physical Therapy Treatment Patient Details Name: Duane [name] MRN: [id number] DOB: May 19, 1975 Today's Date: 03/03/2019    History of Present Illness 44 yo male admitted with COVID Pna, AKI, gastroparesis, orthostatic hypotension. Hx of DM, neuropathy, CKD, OA, NM d/o, MDD, BPPV, obesity    PT Comments    Pt quite motivated to mobilize however continues to be orthostatic which is limiting mobility. Will  Continue to follow in acute setting, see below for VS.    Follow Up Recommendations  Home health PT;Supervision/Assistance - 24 hour     Equipment Recommendations  Other (comment)(TBD)    Recommendations for Other Services       Precautions / Restrictions Precautions Precautions: Fall Precaution Comments: orthostatic hypotension Restrictions Weight Bearing Restrictions: No    Mobility  Bed Mobility Overal bed mobility: Needs Assistance Bed Mobility: Supine to Sit     Supine to sit: Supervision;HOB elevated     General bed mobility comments: for safety  Transfers Overall transfer level: Needs assistance Equipment used: Rolling walker (2 wheeled) Transfers: Sit to/from [facility] Sit to Stand: Min guard;+2 safety/equipment;+2 physical assistance Stand pivot transfers: Min assist;Min guard;+2 physical assistance;+2 safety/equipment;From elevated surface       General transfer comment: sit to stand x2 d/t dizziness. min-min/guard for safety,  Increased A-P swaying in static standing. Stand pivot, bed to recliner; BP supine 130/73, sitting (after standing x 30sec) 110/62  Ambulation/Gait                 Stairs             Wheelchair Mobility    Modified Rankin (Stroke Patients Only)       Balance                                            Cognition Arousal/Alertness: Awake/alert Behavior During Therapy: WFL for tasks assessed/performed Overall Cognitive Status: Within Functional Limits for tasks  assessed                                 General Comments: pt with decr responsiveness in standing however likely related to decr BP      Exercises General Exercises - Lower Extremity Ankle Circles/Pumps: AROM;Both;10 reps Quad Sets: AROM;Both;5 reps    General Comments General comments (skin integrity, edema, etc.): O2 off upon entering room. SpO2= 95-98%, left O2 off with sats 89-92%, encouraged use of IS,  RN made aware      Pertinent Vitals/Pain Pain Assessment: No/denies pain    Home Living                      Prior Function            PT Goals (current goals can now be found in the care plan section) Acute Rehab PT Goals Patient Stated Goal: build endurance PT Goal Formulation: With patient Time For Goal Achievement: 03/15/19 Potential to Achieve Goals: Good Progress towards PT goals: Progressing toward goals    Frequency    Min 3X/week      PT Plan Current plan remains appropriate    Co-evaluation PT/OT/SLP Co-Evaluation/Treatment: Yes Reason for Co-Treatment: To address functional/ADL transfers;For patient/therapist safety PT goals addressed during session: Mobility/safety with mobility;Proper use of DME        AM-PAC PT "6  Clicks" Mobility   Outcome Measure  Help needed turning from your back to your side while in a flat bed without using bedrails?: A Little Help needed moving from lying on your back to sitting on the side of a flat bed without using bedrails?: A Little Help needed moving to and from a bed to a chair (including a wheelchair)?: A Little Help needed standing up from a chair using your arms (e.g., wheelchair or bedside chair)?: A Little Help needed to walk in hospital room?: A Lot Help needed climbing 3-5 steps with a railing? : A Lot 6 Click Score: 16    End of Session   Activity Tolerance: Treatment limited secondary to medical complications (Comment)(orthostasis) Patient left: in chair;with call  bell/phone within reach;with chair alarm set Nurse Communication: Mobility status;Other (comment)(O2 off upone PT/OT entering) PT Visit Diagnosis: Unsteadiness on feet (R26.81);Muscle weakness (generalized) (M62.81);Difficulty in walking, not elsewhere classified (R26.2)     Time: 1130-1200 PT Time Calculation (min) (ACUTE ONLY): 30 min  Charges:  $Therapeutic Activity: 8-22 mins                     Baxter Flattery, PT   Acute Rehab Dept [facility]): [phone]   03/03/2019    [facility] 03/03/2019, 12:19 PM

## 2019-03-03 NOTE — Plan of Care (Signed)

## 2019-03-03 NOTE — Progress Notes (Addendum)
Care assumed. Pt arrived via wheelchair and transferred to bed with minimal assist. Vitals stable. IVF infusing. Denies pain or discomfort. Remains on room air. Pt oriented to room and call system.

## 2019-03-03 NOTE — Progress Notes (Signed)

## 2019-03-04 DIAGNOSIS — E1043 Type 1 diabetes mellitus with diabetic autonomic (poly)neuropathy: Secondary | ICD-10-CM

## 2019-03-04 LAB — GLUCOSE, CAPILLARY
Glucose-Capillary: 136 mg/dL — ABNORMAL HIGH (ref 70–99)
Glucose-Capillary: 157 mg/dL — ABNORMAL HIGH (ref 70–99)
Glucose-Capillary: 158 mg/dL — ABNORMAL HIGH (ref 70–99)
Glucose-Capillary: 177 mg/dL — ABNORMAL HIGH (ref 70–99)

## 2019-03-04 MED ORDER — ZENPEP 40000-126000 UNITS PO CPEP: 2.0000 | ORAL_CAPSULE | Freq: Three times a day (TID) | ORAL | 11 refills | Status: DC

## 2019-03-04 MED ORDER — LABETALOL HCL 200 MG PO TABS: 200.0000 mg | ORAL_TABLET | Freq: Two times a day (BID) | ORAL | 1 refills | Status: DC

## 2019-03-04 MED FILL — !ZENPEP 40000-126000 UNIT C: 40000-12600 | 32 days supply | Qty: 200 | Fill #0

## 2019-03-04 MED FILL — LABETALOL HCL 200 MG TABLET: 200 | 30 days supply | Qty: 60 | Fill #0

## 2019-03-04 NOTE — Progress Notes (Signed)
Discharge orders received. Meds reviewed and follow up appts discussed. Written education also provided. Pt verbalized understanding. Pt stable at this time and awaiting family for pick up to discharge home.

## 2019-03-04 NOTE — Discharge Summary (Signed)
Physician Discharge Summary  Nas Wafer ZSW:[id number] DOB: Oct 22, 1975 DOA: 02/24/2019  PCP: Charlott Rakes, MD  Admit date: 02/24/2019 Discharge date: 03/04/2019  Admitted From: home  Disposition:  home   Recommendations for Outpatient Follow-up:  -I would like to recommend that a creatinine be checked again in 1 week as outpatient   I will hold Lisinopril for now.   Discharge Condition:  stable  CODE STATUS:  Full code   Diet recommendation:  Heart healthy, diabetic    Discharge Diagnoses:  Principal Problem:   Acute respiratory failure with hypoxia ([place])   Pneumonia due to COVID-19 virus Active Problems:   Acute vomiting due to diabetic gastroparesis (HCC)    Acute kidney injury, CKD stage 3 secondary to diabetes    Type I diabetes mellitus with complication, uncontrolled (HCC)   GERD (gastroesophageal reflux disease)   Depression   HTN (hypertension)    Brief Summary: Duane [name] is an 44 y.o. male past medical history significant for chronic diarrhea, kidney disease stage III, depression diabetes mellitus type 1 with peripheral neuropathy, diabetic gastroparesis and diabetic neuropathy, morbid obesity who returns to the emergency room for the second time in the last 2 days with multiple episodes of nausea vomiting about 30 and a 24-hour period, with mild epigastric pain.  ED was found with a pulse of 100 blood pressure 183/116.  Status post convalescent plasma on 02/25/2019  Hospital Course:  Acute respiratory failure secondary to COVID-19 pneumonia -The patient completed a course of IV remdesivir and dexamethasone and has been able to be weaned off of oxygen -He is no longer coughing or short of breath  Nausea and vomiting secondary to diabetic gastroparesis -Patient was treated with IV Reglan, Protonix and sucralfate -His diet has been advanced and he is no longer vomiting  Hypertensive urgency -As he is now tolerating the diet, his medications have been  resumed and blood pressure is much better controlled  Acute renal failure, chronic kidney disease stage IIIb -Patient has a line creatinine of 1.5-2.0 -His creatinine rose steadily during his hospital stay to a peak of 4.27 and nephrology was consulted -He was hydrated slowly and steadily his creatinine has improved to 3.40-nephrology has signed off  -I would like to recommend that a creatinine be checked again in 1 week as outpatient  Hypernatremia, hyperkalemia -Treated and resolved  Type 1 diabetes mellitus uncontrolled -Continue insulin - A1c is 11.2 and has been 10 or greater since 2017  Depression -Continue Cymbalta  Discharge Exam: Vitals:   03/04/19 0517 03/04/19 0912  BP: (!) 164/78 (!) 143/66  Pulse: 85 86  Resp: 16 20  Temp: 97.9 F (36.6 C) 97.6 F (36.4 C)  SpO2: 93% 95%   Vitals:   03/03/19 1842 03/03/19 2019 03/04/19 0517 03/04/19 0912  BP: 132/80 (!) 149/84 (!) 164/78 (!) 143/66  Pulse: 85 83 85 86  Resp:  18 16 20   Temp: 98.1 F (36.7 C) 97.6 F (36.4 C) 97.9 F (36.6 C) 97.6 F (36.4 C)  TempSrc: Oral Oral Oral Oral  SpO2: 94% 95% 93% 95%  Weight:   (!) 154.8 kg   Height:        General: Pt is alert, awake, not in acute distress Cardiovascular: RRR, S1/S2 +, no rubs, no gallops Respiratory: CTA bilaterally, no wheezing, no rhonchi Abdominal: Soft, NT, ND, bowel sounds + Extremities: no edema, no cyanosis   Discharge Instructions  Discharge Instructions    Diet - low sodium heart healthy   Complete  by: As directed    Diet Carb Modified   Complete by: As directed    Increase activity slowly   Complete by: As directed      Allergies as of 03/04/2019   No Known Allergies     Medication List    STOP taking these medications   hyoscyamine 0.125 MG tablet Commonly known as: LEVSIN   lisinopril 40 MG tablet Commonly known as: ZESTRIL   traMADol 50 MG tablet Commonly known as: ULTRAM     TAKE these medications   atorvastatin 20  MG tablet Commonly known as: LIPITOR Take 1 tablet (20 mg total) by mouth daily.   diltiazem 180 MG 24 hr capsule Commonly known as: Cartia XT Take 1 capsule (180 mg total) by mouth daily.   DULoxetine 60 MG capsule Commonly known as: Cymbalta Take 1 capsule (60 mg total) by mouth daily.   furosemide 40 MG tablet Commonly known as: LASIX Take 1.5 tablets (60 mg total) by mouth 2 (two) times daily. What changed: how much to take   gabapentin 300 MG capsule Commonly known as: NEURONTIN Take 1 capsule (300 mg total) by mouth at bedtime.   glucose blood test strip Commonly known as: True Metrix Blood Glucose Test 1 each by Other route 3 (three) times daily.   glucose blood test strip Commonly known as: Contour Next Test Use as instructed   glucose blood test strip Commonly known as: Contour Next Test Use as instructed   insulin lispro 100 UNIT/ML KwikPen Commonly known as: HumaLOG KwikPen INJECT 15 UNITS INTO THE SKIN 3 TIMES DAILY. What changed:   how much to take  how to take this  when to take this  additional instructions   Insulin Pen Needle 31G X 8 MM Misc Inject 1 each into the skin 4 (four) times daily. Check blood sugar TID and QHS   labetalol 200 MG tablet Commonly known as: NORMODYNE Take 1 tablet (200 mg total) by mouth 2 (two) times daily.   Lantus SoloStar 100 UNIT/ML Solostar Pen Generic drug: Insulin Glargine Inject 40 Units into the skin 2 (two) times daily.   methocarbamol 500 MG tablet Commonly known as: Robaxin Take 1 tablet (500 mg total) by mouth every 8 (eight) hours as needed for muscle spasms.   metoCLOPramide 10 MG tablet Commonly known as: REGLAN Take a half hour before meals What changed:   how much to take  how to take this  when to take this  additional instructions   pantoprazole 40 MG tablet Commonly known as: PROTONIX Take 1 tablet (40 mg total) by mouth daily. What changed: Another medication with the same name  was removed. Continue taking this medication, and follow the directions you see here.   Peppermint Oil 90 MG Cpcr Commonly known as: IBgard Take 2 capsules by mouth 3 (three) times daily as needed. What changed:   how much to take  when to take this  reasons to take this   potassium chloride 10 MEQ CR capsule Commonly known as: MICRO-K Take 1 capsule (10 mEq total) by mouth daily.   promethazine 25 MG tablet Commonly known as: PHENERGAN TAKE 1 TABLET BY MOUTH EVERY 8 HOURS AS NEEDED FOR NAUSEA OR VOMITING. What changed:   how much to take  how to take this  when to take this  reasons to take this  additional instructions   sucralfate 1 g tablet Commonly known as: CARAFATE TAKE 1 TABLET (1 G TOTAL) BY MOUTH 4 (  FOUR) TIMES DAILY.   terbinafine 250 MG tablet Commonly known as: LamISIL Take 1 tablet (250 mg total) by mouth daily.   True Metrix Meter w/Device Kit 1 each by Does not apply route 4 (four) times daily -  before meals and at bedtime.   TRUEplus Lancets 28G Misc 1 each by Does not apply route 4 (four) times daily -  before meals and at bedtime.   Xifaxan 550 MG Tabs tablet Generic drug: rifaximin TAKE 1 TABLET (550 MG TOTAL) BY MOUTH 3 (THREE) TIMES DAILY. What changed: See the new instructions.   Zenpep 40000-126000 units Cpep Generic drug: Pancrelipase (Lip-Prot-Amyl) Take 2 capsules by mouth 3 (three) times daily with meals. Take during meal       No Known Allergies   Procedures/Studies:    US Renal  Result Date: 02/27/2019 CLINICAL DATA:  Renal failure. EXAM: RENAL / URINARY TRACT ULTRASOUND COMPLETE COMPARISON:  No prior. FINDINGS: Right Kidney: Renal measurements: 13.4 x 6.7 x 6.8 cm = volume: 319 mL . Echogenicity within normal limits. No mass or hydronephrosis visualized. Left Kidney: Renal measurements: 11.6 x 6.8 x 5.9 cm = volume: 235 mL. Echogenicity within normal limits. No mass or hydronephrosis visualized. Bladder: Appears normal  for degree of bladder distention. Left ureteral jet not visualized. Other: None. IMPRESSION: No acute abnormality identified.  No evidence of hydronephrosis. Electronically Signed   By: Marcello Moores  Register   On: 02/27/2019 15:27   DG CHEST PORT 1 VIEW  Result Date: 03/01/2019 CLINICAL DATA:  Dyspnea. EXAM: PORTABLE CHEST 1 VIEW COMPARISON:  One-view chest x-ray 02/27/2019 FINDINGS: The heart size is normal, exaggerated by low lung volumes. Mild bibasilar opacities are again seen without significant change. Upper lung fields are clear. Visualized soft tissues and bony thorax are unremarkable. IMPRESSION: Low lung volumes with mild bibasilar atelectasis. No acute cardiopulmonary disease. Electronically Signed   By: San Morelle M.D.   On: 03/01/2019 06:12   DG CHEST PORT 1 VIEW  Result Date: 02/27/2019 CLINICAL DATA:  Dyspnea EXAM: PORTABLE CHEST 1 VIEW COMPARISON:  02/25/2019 FINDINGS: The heart size and mediastinal contours are within normal limits. Both lungs are clear. The visualized skeletal structures are unremarkable. IMPRESSION: No active disease. Electronically Signed   By: [name]   On: 02/27/2019 09:53   DG CHEST PORT 1 VIEW  Result Date: 02/25/2019 CLINICAL DATA:  Fever EXAM: PORTABLE CHEST 1 VIEW COMPARISON:  February 24, 2019 FINDINGS: The heart size and mediastinal contours are within normal limits. Mildly increased patchy airspace opacity seen at the right lung base. The visualized skeletal structures are unremarkable. IMPRESSION: Mildly increased patchy airspace opacity at the right lung base which could be due to atelectasis and/or early infectious etiology. Electronically Signed   By: Prudencio Pair M.D.   On: 02/25/2019 06:01   DG CHEST PORT 1 VIEW  Result Date: 02/24/2019 CLINICAL DATA:  44 year old male with positive COVID-19. EXAM: PORTABLE CHEST 1 VIEW COMPARISON:  Chest radiograph dated 03/21/2018. FINDINGS: Shallow inspiration. Minimal bibasilar streaky densities,  likely atelectasis. Developing infiltrate is not excluded clinical correlation is recommended no lobar consolidation, pleural effusion or pneumothorax. Stable mild cardiomegaly. No acute osseous pathology. IMPRESSION: Shallow inspiration with bibasilar atelectasis versus developing infiltrate. Electronically Signed   By: Anner Crete M.D.   On: 02/24/2019 20:51   DG Abd 2 Views  Result Date: 02/26/2019 CLINICAL DATA:  44 year old male with abdominal pain. EXAM: ABDOMEN - 2 VIEW COMPARISON:  CT of the abdomen pelvis dated 06/29/2014 and  abdominal radiograph dated 01/01/2015. FINDINGS: Evaluation is limited due to patient's body habitus. There is no bowel dilatation or evidence of obstruction. No free air identified. Right upper quadrant cholecystectomy clips. Bibasilar confluent and nodular densities may represent atelectasis or infiltrate. Clinical correlation is recommended. The osseous structures and soft tissues are grossly unremarkable. IMPRESSION: 1. No evidence of bowel obstruction. 2. Bibasilar atelectasis or infiltrate. Electronically Signed   By: Anner Crete M.D.   On: 02/26/2019 18:59   [name] EKG SITE RITE  Result Date: 02/27/2019 If Site Rite image not attached, placement could not be confirmed due to current cardiac rhythm.    The results of significant diagnostics from this hospitalization (including imaging, microbiology, ancillary and laboratory) are listed below for reference.     Microbiology: Recent Results (from the past 240 hour(s))  SARS CORONAVIRUS 2 (TAT 6-24 HRS) Nasopharyngeal Nasopharyngeal Swab     Status: Abnormal   Collection Time: 02/24/19  1:12 PM   Specimen: Nasopharyngeal Swab  Result Value Ref Range Status   SARS Coronavirus 2 POSITIVE (A) NEGATIVE Final    Comment: RESULT CALLED TO, READ BACK BY AND VERIFIED WITH: S.HOEFLER,RN [phone] I.MANNING (NOTE) SARS-CoV-2 target nucleic acids are DETECTED. The SARS-CoV-2 RNA is generally detectable in  upper and lower respiratory specimens during the acute phase of infection. Positive results are indicative of the presence of SARS-CoV-2 RNA. Clinical correlation with patient history and other diagnostic information is  necessary to determine patient infection status. Positive results do not rule out bacterial infection or co-infection with other viruses.  The expected result is Negative. Fact Sheet for Patients: SugarRoll.be Fact Sheet for Healthcare Providers: https://www.woods-mathews.com/ This test is not yet approved or cleared by the [place] FDA and  has been authorized for detection and/or diagnosis of SARS-CoV-2 by FDA under an Emergency Use Authorization (EUA). This EUA will remain  in effect (meaning this test can be used) for th e duration of the COVID-19 declaration under Section 564(b)(1) of the Act, 21 U.S.C. section 360bbb-3(b)(1), unless the authorization is terminated or revoked sooner. Performed at [facility], [place] [address], [place], [place] [address]   MRSA PCR Screening     Status: None   Collection Time: 02/26/19  6:41 PM   Specimen: Nasal Mucosa; Nasopharyngeal  Result Value Ref Range Status   MRSA by PCR NEGATIVE NEGATIVE Final    Comment:        The GeneXpert MRSA Assay (FDA approved for NASAL specimens only), is one component of a comprehensive MRSA colonization surveillance program. It is not intended to diagnose MRSA infection nor to guide or monitor treatment for MRSA infections. Performed at [facility], [place] [address], [place], [place] [address]      Labs: BNP (last 3 results) No results for input(s): BNP in the last 8760 hours. Basic Metabolic Panel: Recent Labs  Lab 02/27/19 1509 02/28/19 0132 03/01/19 0138 03/02/19 0155 03/03/19 0144  NA 150* 145 146* 141 142  K 4.3 4.2 4.4 4.6 3.8  CL 120* 117* 116* 111 114*  CO2 19* 17* 21* 19* 21*  GLUCOSE 202* 222*  179* 261* 101*  BUN 53* 57* 64* 72* 63*  CREATININE 3.59* 3.76* 4.27* 4.03* 3.40*  CALCIUM 7.9* 7.8* 8.1* 8.2* 7.9*  PHOS  --   --  5.0* 5.7* 5.2*   Liver Function Tests: Recent Labs  Lab 03/01/19 0138 03/02/19 0155 03/03/19 0144  ALBUMIN 2.7* 2.7* 2.5*   No results for input(s): LIPASE,  AMYLASE in the last 168 hours. No results for input(s): AMMONIA in the last 168 hours. CBC: No results for input(s): WBC, NEUTROABS, HGB, HCT, MCV, PLT in the last 168 hours. Cardiac Enzymes: No results for input(s): CKTOTAL, CKMB, CKMBINDEX, TROPONINI in the last 168 hours. BNP: Invalid input(s): POCBNP CBG: Recent Labs  Lab 03/03/19 1734 03/03/19 2022 03/04/19 0030 03/04/19 0351 03/04/19 0747  GLUCAP 152* 143* 177* 157* 136*   D-Dimer Recent Labs    03/02/19 0155 03/03/19 0144  DDIMER 6.86* 2.94*   Hgb A1c No results for input(s): HGBA1C in the last 72 hours. Lipid Profile No results for input(s): CHOL, HDL, LDLCALC, TRIG, CHOLHDL, LDLDIRECT in the last 72 hours. Thyroid function studies No results for input(s): TSH, T4TOTAL, T3FREE, THYROIDAB in the last 72 hours.  Invalid input(s): FREET3 Anemia work up No results for input(s): VITAMINB12, FOLATE, FERRITIN, TIBC, IRON, RETICCTPCT in the last 72 hours. Urinalysis    Component Value Date/Time   COLORURINE YELLOW 02/24/2019 0542   APPEARANCEUR CLEAR 02/24/2019 0542   LABSPEC 1.021 02/24/2019 0542   PHURINE 5.0 02/24/2019 0542   GLUCOSEU >=500 (A) 02/24/2019 0542   HGBUR MODERATE (A) 02/24/2019 0542   BILIRUBINUR NEGATIVE 02/24/2019 0542   BILIRUBINUR negative 06/01/2016 1059   KETONESUR 5 (A) 02/24/2019 0542   PROTEINUR >=300 (A) 02/24/2019 0542   UROBILINOGEN 0.2 06/01/2016 1059   UROBILINOGEN 0.2 06/27/2014 0748   NITRITE NEGATIVE 02/24/2019 0542   LEUKOCYTESUR NEGATIVE 02/24/2019 0542   Sepsis Labs Invalid input(s): PROCALCITONIN,  WBC,  LACTICIDVEN Microbiology Recent Results (from the past 240 hour(s))   SARS CORONAVIRUS 2 (TAT 6-24 HRS) Nasopharyngeal Nasopharyngeal Swab     Status: Abnormal   Collection Time: 02/24/19  1:12 PM   Specimen: Nasopharyngeal Swab  Result Value Ref Range Status   SARS Coronavirus 2 POSITIVE (A) NEGATIVE Final    Comment: RESULT CALLED TO, READ BACK BY AND VERIFIED WITH: S.HOEFLER,RN [phone] I.MANNING (NOTE) SARS-CoV-2 target nucleic acids are DETECTED. The SARS-CoV-2 RNA is generally detectable in upper and lower respiratory specimens during the acute phase of infection. Positive results are indicative of the presence of SARS-CoV-2 RNA. Clinical correlation with patient history and other diagnostic information is  necessary to determine patient infection status. Positive results do not rule out bacterial infection or co-infection with other viruses.  The expected result is Negative. Fact Sheet for Patients: SugarRoll.be Fact Sheet for Healthcare Providers: https://www.woods-mathews.com/ This test is not yet approved or cleared by the [place] FDA and  has been authorized for detection and/or diagnosis of SARS-CoV-2 by FDA under an Emergency Use Authorization (EUA). This EUA will remain  in effect (meaning this test can be used) for th e duration of the COVID-19 declaration under Section 564(b)(1) of the Act, 21 U.S.C. section 360bbb-3(b)(1), unless the authorization is terminated or revoked sooner. Performed at [facility], [place] [address], [place], [place] [address]   MRSA PCR Screening     Status: None   Collection Time: 02/26/19  6:41 PM   Specimen: Nasal Mucosa; Nasopharyngeal  Result Value Ref Range Status   MRSA by PCR NEGATIVE NEGATIVE Final    Comment:        The GeneXpert MRSA Assay (FDA approved for NASAL specimens only), is one component of a comprehensive MRSA colonization surveillance program. It is not intended to diagnose MRSA infection nor to guide or monitor treatment  for MRSA infections. Performed at [facility], [address] [place], [place]  [address]      Time coordinating discharge in minutes: 65  SIGNED:   Debbe Odea, MD  [facility] 03/04/2019, 11:55 AM Pager   If 7PM-7AM, please contact night-coverage [email or web address] Password [email or web address]

## 2019-03-04 NOTE — Discharge Instructions (Signed)
10 Things You Can Do to Manage Your COVID-19 Symptoms at Home If you have possible or confirmed COVID-19: 1. Stay home from work and school. And stay away from other public places. If you must go out, avoid using any kind of public transportation, ridesharing, or taxis. 2. Monitor your symptoms carefully. If your symptoms get worse, call your healthcare provider immediately. 3. Get rest and stay hydrated. 4. If you have a medical appointment, call the healthcare provider ahead of time and tell them that you have or may have COVID-19. 5. For medical emergencies, call 911 and notify the dispatch personnel that you have or may have COVID-19. 6. Cover your cough and sneezes with a tissue or use the inside of your elbow. 7. Wash your hands often with soap and water for at least 20 seconds or clean your hands with an alcohol-based hand sanitizer that contains at least 60% alcohol. 8. As much as possible, stay in a specific room and away from other people in your home. Also, you should use a separate bathroom, if available. If you need to be around other people in or outside of the home, wear a mask. 9. Avoid sharing personal items with other people in your household, like dishes, towels, and bedding. 10. Clean all surfaces that are touched often, like counters, tabletops, and doorknobs. Use household cleaning sprays or wipes according to the label instructions. [email or web address].[email or web address] 08/13/2018 This information is not intended to replace advice given to you by your health care provider. Make sure you discuss any questions you have with your health care provider. Document Revised: 01/15/2019 Document Reviewed: 01/15/2019 Elsevier Patient Education  [place].   Abdominal Pain, Adult Many things can cause belly (abdominal) pain. Most times, belly pain is not dangerous. Many cases of belly pain can be watched and treated at home. Sometimes, though, belly pain is serious. Your doctor will try to find  the cause of your belly pain. Follow these instructions at home:  Medicines  Take over-the-counter and prescription medicines only as told by your doctor.  Do not take medicines that help you poop (laxatives) unless told by your doctor. General instructions  Watch your belly pain for any changes.  Drink enough fluid to keep your pee (urine) pale yellow.  Keep all follow-up visits as told by your doctor. This is important. Contact a doctor if:  Your belly pain changes or gets worse.  You are not hungry, or you lose weight without trying.  You are having trouble pooping (constipated) or have watery poop (diarrhea) for more than 2-3 days.  You have pain when you pee or poop.  Your belly pain wakes you up at night.  Your pain gets worse with meals, after eating, or with certain foods.  You are vomiting and cannot keep anything down.  You have a fever.  You have blood in your pee. Get help right away if:  Your pain does not go away as soon as your doctor says it should.  You cannot stop vomiting.  Your pain is only in areas of your belly, such as the right side or the left lower part of the belly.  You have bloody or black poop, or poop that looks like tar.  You have very bad pain, cramping, or bloating in your belly.  You have signs of not having enough fluid or water in your body (dehydration), such as: ? Dark pee, very little pee, or no pee. ? Cracked lips. ? Dry mouth. ?  Sunken eyes. ? Sleepiness. ? Weakness.  You have trouble breathing or chest pain. Summary  Many cases of belly pain can be watched and treated at home.  Watch your belly pain for any changes.  Take over-the-counter and prescription medicines only as told by your doctor.  Contact a doctor if your belly pain changes or gets worse.  Get help right away if you have very bad pain, cramping, or bloating in your belly. This information is not intended to replace advice given to you by your  health care provider. Make sure you discuss any questions you have with your health care provider. Document Revised: 06/09/2018 Document Reviewed: 06/09/2018 Elsevier Patient Education  [place].   Acute Kidney Injury, Adult  Acute kidney injury is a sudden worsening of kidney function. The kidneys are organs that have several jobs. They filter the blood to remove waste products and extra fluid. They also maintain a healthy balance of minerals and hormones in the body, which helps control blood pressure and keep bones strong. With this condition, your kidneys do not do their jobs as well as they should. This condition ranges from mild to severe. Over time it may develop into long-lasting (chronic) kidney disease. Early detection and treatment may prevent acute kidney injury from developing into a chronic condition. What are the causes? Common causes of this condition include:  A problem with blood flow to the kidneys. This may be caused by: ? Low blood pressure (hypotension) or shock. ? Blood loss. ? Heart and blood vessel (cardiovascular) disease. ? Severe burns. ? Liver disease.  Direct damage to the kidneys. This may be caused by: ? Certain medicines. ? A kidney infection. ? Poisoning. ? Being around or in contact with toxic substances. ? A surgical wound. ? A hard, direct hit to the kidney area.  A sudden blockage of urine flow. This may be caused by: ? Cancer. ? Kidney stones. ? An enlarged prostate in males. What are the signs or symptoms? Symptoms of this condition may not be obvious until the condition becomes severe. Symptoms of this condition can include:  Tiredness (lethargy), or difficulty staying awake.  Nausea or vomiting.  Swelling (edema) of the face, legs, ankles, or feet.  Problems with urination, such as: ? Abdominal pain, or pain along the side of your stomach (flank). ? Decreased urine production. ? Decrease in the force of urine  flow.  Muscle twitches and cramps, especially in the legs.  Confusion or trouble concentrating.  Loss of appetite.  Fever. How is this diagnosed? This condition may be diagnosed with tests, including:  Blood tests.  Urine tests.  Imaging tests.  A test in which a sample of tissue is removed from the kidneys to be examined under a microscope (kidney biopsy). How is this treated? Treatment for this condition depends on the cause and how severe the condition is. In mild cases, treatment may not be needed. The kidneys may heal on their own. In more severe cases, treatment will involve:  Treating the cause of the kidney injury. This may involve changing any medicines you are taking or adjusting your dosage.  Fluids. You may need specialized IV fluids to balance your body's needs.  Having a catheter placed to drain urine and prevent blockages.  Preventing problems from occurring. This may mean avoiding certain medicines or procedures that can cause further injury to the kidneys. In some cases treatment may also require:  A procedure to remove toxic wastes from the body (  dialysis or continuous renal replacement therapy - CRRT).  Surgery. This may be done to repair a torn kidney, or to remove the blockage from the urinary system. Follow these instructions at home: Medicines  Take over-the-counter and prescription medicines only as told by your health care provider.  Do not take any new medicines without your health care provider's approval. Many medicines can worsen your kidney damage.  Do not take any vitamin and mineral supplements without your health care provider's approval. Many nutritional supplements can worsen your kidney damage. Lifestyle  If your health care provider prescribed changes to your diet, follow them. You may need to decrease the amount of protein you eat.  Achieve and maintain a healthy weight. If you need help with this, ask your health care  provider.  Start or continue an exercise plan. Try to exercise at least 30 minutes a day, 5 days a week.  Do not use any tobacco products, such as cigarettes, chewing tobacco, and e-cigarettes. If you need help quitting, ask your health care provider. General instructions  Keep track of your blood pressure. Report changes in your blood pressure as told by your health care provider.  Stay up to date with immunizations. Ask your health care provider which immunizations you need.  Keep all follow-up visits as told by your health care provider. This is important. Where to find more information  American Association of Kidney Patients: BombTimer.gl  National Kidney Foundation: www.kidney.[place]: https://mathis.com/  Life Options Rehabilitation Program: ? www.lifeoptions.org ? www.kidneyschool.org Contact a health care provider if:  Your symptoms get worse.  You develop new symptoms. Get help right away if:  You develop symptoms of worsening kidney disease, which include: ? Headaches. ? Abnormally dark or light skin. ? Easy bruising. ? Frequent hiccups. ? Chest pain. ? Shortness of breath. ? End of menstruation in women. ? Seizures. ? Confusion or altered mental status. ? Abdominal or back pain. ? Itchiness.  You have a fever.  Your body is producing less urine.  You have pain or bleeding when you urinate. Summary  Acute kidney injury is a sudden worsening of kidney function.  Acute kidney injury can be caused by problems with blood flow to the kidneys, direct damage to the kidneys, and sudden blockage of urine flow.  Symptoms of this condition may not be obvious until it becomes severe. Symptoms may include edema, lethargy, confusion, nausea or vomiting, and problems passing urine.  This condition can usually be diagnosed with blood tests, urine tests, and imaging tests. Sometimes a kidney biopsy is done to diagnose this condition.  Treatment for  this condition often involves treating the underlying cause. It is treated with fluids, medicines, dialysis, diet changes, or surgery. This information is not intended to replace advice given to you by your health care provider. Make sure you discuss any questions you have with your health care provider. Document Revised: 01/11/2017 Document Reviewed: 01/20/2016 Elsevier Patient Education  2020 [employer].

## 2019-03-04 NOTE — Progress Notes (Signed)
Nutrition Brief Note RD working remotely.   Patient identified on the Malnutrition Screening Tool (MST) Report  Wt Readings from Last 15 Encounters:  03/04/19 (!) 154.8 kg  02/23/19 (!) 152.9 kg  01/29/19 (!) 154.2 kg  10/21/18 (!) 154.5 kg  09/22/18 (!) 154.2 kg  09/16/18 (!) 154.6 kg  07/14/18 (!) 160.1 kg  04/11/18 (!) 162.3 kg  04/07/18 (!) 160.4 kg  03/21/18 (!) 162.4 kg  03/11/18 (!) 164.7 kg  02/24/18 (!) 164.5 kg  02/11/18 (!) 163.9 kg  01/08/18 (!) 161.7 kg  01/01/18 (!) 163.8 kg    Body mass index is 41.54 kg/m. Patient meets criteria for morbid obesity based on current BMI. Weight today is 340 lb and weight has been stable since 09/16/18.  Current diet order is Soft and patient consumed 75% of breakfast this AM (539 kcal, 16 grams protein). Labs and medications reviewed.   Patient is COVID-19 positive. Discharge order and discharge summary for discharge home was entered ~1 hour ago.   No nutrition interventions warranted at this time. If nutrition issues arise, please consult RD.    Jarome Matin, MS, RD, LDN, [facility] Inpatient Clinical Dietitian Pager # [phone] After hours/weekend pager # [phone]

## 2019-03-05 ENCOUNTER — Telehealth: Payer: Self-pay

## 2019-03-05 NOTE — Telephone Encounter (Signed)
Transition Care Management Follow-up Telephone Call Date of discharge and from where: 03/04/2019, [facility]   Call placed to patient # [phone], message left with call back requested to this CM.  Patient needs follow up appointment, televisit

## 2019-03-06 ENCOUNTER — Telehealth: Payer: Self-pay

## 2019-03-06 NOTE — Telephone Encounter (Signed)
Transition Care Management Follow-up Telephone Call  # 2   Date of discharge and from where: 03/04/2019, [facility]   Call placed to patient # [phone], message left with call back requested to this CM.  Patient needs follow up appointment, televisit

## 2019-03-09 ENCOUNTER — Telehealth: Payer: Self-pay

## 2019-03-09 NOTE — Telephone Encounter (Signed)
Letter sent to patient requesting he call the office to schedule a hospital follow up appointment as we have not been able to reach him.

## 2019-03-10 MED FILL — TRUE METRIX TEST STRIP: 25 days supply | Qty: 100 | Fill #4

## 2019-03-10 MED FILL — ?METOCLOPRAMIDE 10MG TABLET: 10 | 10 days supply | Qty: 30 | Fill #1

## 2019-03-24 ENCOUNTER — Encounter: Payer: Self-pay | Admitting: Family Medicine

## 2019-03-24 ENCOUNTER — Ambulatory Visit: Payer: Medicare Other | Attending: Family Medicine | Admitting: Family Medicine

## 2019-03-24 ENCOUNTER — Other Ambulatory Visit: Payer: Self-pay

## 2019-03-24 ENCOUNTER — Telehealth: Payer: Self-pay

## 2019-03-24 DIAGNOSIS — J1282 Pneumonia due to coronavirus disease 2019: Secondary | ICD-10-CM

## 2019-03-24 DIAGNOSIS — I10 Essential (primary) hypertension: Secondary | ICD-10-CM | POA: Diagnosis not present

## 2019-03-24 DIAGNOSIS — E1043 Type 1 diabetes mellitus with diabetic autonomic (poly)neuropathy: Secondary | ICD-10-CM | POA: Diagnosis not present

## 2019-03-24 DIAGNOSIS — U071 COVID-19: Secondary | ICD-10-CM | POA: Diagnosis not present

## 2019-03-24 DIAGNOSIS — E1021 Type 1 diabetes mellitus with diabetic nephropathy: Secondary | ICD-10-CM

## 2019-03-24 DIAGNOSIS — E1022 Type 1 diabetes mellitus with diabetic chronic kidney disease: Secondary | ICD-10-CM

## 2019-03-24 DIAGNOSIS — E0843 Diabetes mellitus due to underlying condition with diabetic autonomic (poly)neuropathy: Secondary | ICD-10-CM

## 2019-03-24 DIAGNOSIS — K3184 Gastroparesis: Secondary | ICD-10-CM

## 2019-03-24 DIAGNOSIS — N1832 Chronic kidney disease, stage 3b: Secondary | ICD-10-CM

## 2019-03-24 MED ORDER — DULOXETINE HCL 60 MG PO CPEP: 60.0000 mg | ORAL_CAPSULE | Freq: Every day | ORAL | 3 refills | Status: AC

## 2019-03-24 MED ORDER — PREGABALIN 75 MG PO CAPS: 75.0000 mg | ORAL_CAPSULE | Freq: Two times a day (BID) | ORAL | 3 refills | Status: AC

## 2019-03-24 NOTE — Progress Notes (Signed)
Virtual Visit via Telephone Note  I connected with [name], on 03/24/2019 at 3:35 PM by telephone due to the COVID-19 pandemic and verified that I am speaking with the correct person using two identifiers.   Consent: I discussed the limitations, risks, security and privacy concerns of performing an evaluation and management service by telephone and the availability of in person appointments. I also discussed with the patient that there may be a patient responsible charge related to this service. The patient expressed understanding and agreed to proceed.   Location of Patient: Home  Location of Provider: Clinic   Persons participating in Telemedicine visit: Latoya Diskin [place]-CMA Dr. Margarita Rana     History of Present Illness: Duane [name] a 44 year old male with history of type 1 diabetes mellitus (A1c 11.2), diabetic neuropathy, diabetic gastroparesis hypertension, stage III chronic kidney disease, depression who presents today for a follow-up visit. He was hospitalized at [facility] from 02/24/2019 through 03/04/2019 for gastroparesis and pneumonia secondary to COVID-19 virus. He received convalescent plasma and was treated with remdesivir and dexamethasone along with oxygen therapy.  During his hospital course he developed acute on chronic kidney disease with a trend up in creatinine from a baseline of 1.5-2 up to 4.27 and his lisinopril was held. Discharge creatinine was 3.47 and his nephrologist is with [facility].  Since discharge he has had dyspnea especially when he goes to sleep, with any activity, complains sinuses are dry; this worries him.  He has also had some dry cough; feels like he needs to get a treatment.  Also has fatigue and body aches. He thinks the IV infiltrated his R arm during hospitalization as he does have edema of both arms but right is greater than left. Does not know what his blood pressure reading is like since he  was taken off lisinopril.  His Diabetic neuropathy has been severe and uncontrolled on Cymbalta; previously unable to tolerate gabapentin due to blurry vision.  He has not tried Lyrica in the past.  He has not had any recent gastroparesis symptoms but informs me that when he has severe gastroparesis opioid analgesics were the only thing that calmed his symptoms. His gastroenterologist is Dr. Carlean Purl. Diabetes is followed by endocrine Dr Dwyane Dee He does have underlying depression which has played a significant role with regards to compliance with his diabetic medications Past Medical History:  Diagnosis Date  . Allergy   . Arthritis   . Chronic diarrhea   . Chronic kidney disease   . Depression   . Diabetes mellitus without complication [facility]) age 44   insulin requiring from the start  . Diabetic neuropathy ([place]) Dx 20110  . Diabetic proliferative retinopathy ([place]) 08/02/2017  . Gastritis   . Gastroparesis 2012   100% gastric retention on 08/2013 [facility]  . GERD (gastroesophageal reflux disease) Dx 2012  . Hx of adenomatous polyp of colon 10/05/2018  . Hyperlipidemia   . Hypertension Dx 2012  . Neuromuscular disorder ([place])   . Obesity    No Known Allergies  Current Outpatient Medications on File Prior to Visit  Medication Sig Dispense Refill  . atorvastatin (LIPITOR) 20 MG tablet Take 1 tablet (20 mg total) by mouth daily. 30 tablet 2  . Blood Glucose Monitoring Suppl (TRUE METRIX METER) W/DEVICE KIT 1 each by Does not apply route 4 (four) times daily -  before meals and at bedtime. 1 kit ea  . diltiazem (CARTIA XT) 180 MG 24 hr capsule Take 1 capsule (  180 mg total) by mouth daily. 30 capsule 3  . DULoxetine (CYMBALTA) 60 MG capsule Take 1 capsule (60 mg total) by mouth daily. 30 capsule 3  . furosemide (LASIX) 40 MG tablet Take 1.5 tablets (60 mg total) by mouth 2 (two) times daily. (Patient taking differently: Take 40 mg by mouth 2 (two) times daily. ) 90 tablet 3  . gabapentin (NEURONTIN)  300 MG capsule Take 1 capsule (300 mg total) by mouth at bedtime. 30 capsule 6  . glucose blood (CONTOUR NEXT TEST) test strip Use as instructed 100 each 12  . glucose blood (CONTOUR NEXT TEST) test strip Use as instructed 100 each 12  . glucose blood (TRUE METRIX BLOOD GLUCOSE TEST) test strip 1 each by Other route 3 (three) times daily. 100 each 11  . Insulin Glargine (LANTUS SOLOSTAR) 100 UNIT/ML Solostar Pen Inject 40 Units into the skin 2 (two) times daily. 30 mL 3  . insulin lispro (HUMALOG KWIKPEN) 100 UNIT/ML KwikPen INJECT 15 UNITS INTO THE SKIN 3 TIMES DAILY. (Patient taking differently: Inject 15 Units into the skin 3 (three) times daily. ) 15 mL 2  . Insulin Pen Needle 31G X 8 MM MISC Inject 1 each into the skin 4 (four) times daily. Check blood sugar TID and QHS 120 each 11  . labetalol (NORMODYNE) 200 MG tablet Take 1 tablet (200 mg total) by mouth 2 (two) times daily. 60 tablet 1  . methocarbamol (ROBAXIN) 500 MG tablet Take 1 tablet (500 mg total) by mouth every 8 (eight) hours as needed for muscle spasms. 60 tablet 1  . metoCLOPramide (REGLAN) 10 MG tablet Take a half hour before meals (Patient taking differently: Take 10 mg by mouth 3 (three) times daily before meals. ) 90 tablet 6  . pantoprazole (PROTONIX) 40 MG tablet Take 1 tablet (40 mg total) by mouth daily. 60 tablet 6  . Peppermint Oil (IBGARD) 90 MG CPCR Take 2 capsules by mouth 3 (three) times daily as needed. (Patient taking differently: Take 1 capsule by mouth daily as needed (IBD). ) 16 capsule 0  . potassium chloride (MICRO-K) 10 MEQ CR capsule Take 1 capsule (10 mEq total) by mouth daily. 30 capsule 3  . promethazine (PHENERGAN) 25 MG tablet TAKE 1 TABLET BY MOUTH EVERY 8 HOURS AS NEEDED FOR NAUSEA OR VOMITING. (Patient taking differently: Take 25 mg by mouth every 8 (eight) hours as needed for nausea or vomiting. ) 40 tablet 1  . sucralfate (CARAFATE) 1 g tablet TAKE 1 TABLET (1 G TOTAL) BY MOUTH 4 (FOUR) TIMES DAILY.  120 tablet 1  . terbinafine (LAMISIL) 250 MG tablet Take 1 tablet (250 mg total) by mouth daily. 90 tablet 0  . TRUEPLUS LANCETS 28G MISC 1 each by Does not apply route 4 (four) times daily -  before meals and at bedtime. 100 each 12  . XIFAXAN 550 MG TABS tablet TAKE 1 TABLET (550 MG TOTAL) BY MOUTH 3 (THREE) TIMES DAILY. (Patient taking differently: Take 550 mg by mouth daily as needed (Bacterial infrction in stomach). ) 30 tablet 2  . Pancrelipase, Lip-Prot-Amyl, (ZENPEP) 40000-126000 units CPEP Take 2 capsules by mouth 3 (three) times daily with meals. Take during meal (Patient not taking: Reported on 03/24/2019) 180 capsule 11   No current facility-administered medications on file prior to visit.    Observations/Objective: Awake, alert, oriented x3 Not in acute distress  CMP Latest Ref Rng & Units 03/03/2019 03/02/2019 03/01/2019  Glucose 70 - 99 mg/dL 101(H)  261(H) 179(H)  BUN 6 - 20 mg/dL 63(H) 72(H) 64(H)  Creatinine 0.61 - 1.24 mg/dL 3.40(H) 4.03(H) 4.27(H)  Sodium 135 - 145 mmol/L 142 141 146(H)  Potassium 3.5 - 5.1 mmol/L 3.8 4.6 4.4  Chloride 98 - 111 mmol/L 114(H) 111 116(H)  CO2 22 - 32 mmol/L 21(L) 19(L) 21(L)  Calcium 8.9 - 10.3 mg/dL 7.9(L) 8.2(L) 8.1(L)  Total Protein 6.5 - 8.1 g/dL - - -  Total Bilirubin 0.3 - 1.2 mg/dL - - -  Alkaline Phos 38 - 126 U/L - - -  AST 15 - 41 U/L - - -  ALT 0 - 44 U/L - - -    Lab Results  Component Value Date   HGBA1C 11.2 (H) 02/24/2019    Assessment and Plan: 1. Pneumonia due to COVID-19 virus Status post convalescent plasma and treatment with remdesivir and steroids Ongoing respiratory symptoms Advised that fatigue and some degree of dyspnea is not uncommon post COVID-19 I will have him evaluated at the respiratory clinic for optimization of management  2. Diabetic autonomic neuropathy associated with diabetes mellitus due to underlying condition ([place]) Uncontrolled He has been out of Cymbalta which I have refilled Unable to  tolerate gabapentin in the past Lyrica added to regimen - DULoxetine (CYMBALTA) 60 MG capsule; Take 1 capsule (60 mg total) by mouth daily.  Dispense: 30 capsule; Refill: 3  3. Type 1 diabetes mellitus with stage 3b chronic kidney disease ([place]) Uncontrolled diabetes mellitus with depression playing a major role in compliance with medications At previous visits I have had the LCSW work with him with regards to counseling and motivational interviewing I also referred him to psych but he never made it there Diabetes is uncontrolled with A1c of 11.2-encouraged on compliance with his regimen Advised to keep appointment with his endocrinologist-Dr. Dwyane Dee He will need to have repeat labs for evaluation of kidney function was discharge creatinine was 3.47 up from a baseline of 1.5-2; this will need to be done at the respiratory clinic. Keep appointment with [facility]  4. Diabetic gastroparesis associated with type 1 diabetes mellitus ([place]) He has had recurrent flares Suboptimal glycemic control hugely playing a role Symptoms are stable at this time Currently on Xifaxan and Reglan Keep appointment with GI-Dr. Carlean Purl  4 Essential hypertension Likely to be uncontrolled given he has been off lisinopril At his visit to the respiratory clinic this will be evaluated and optimized I will see him back for follow-up  Follow Up Instructions: Appointment with respiratory clinic has been scheduled for tomorrow at 7 PM Keep previously scheduled appointment   I discussed the assessment and treatment plan with the patient. The patient was provided an opportunity to ask questions and all were answered. The patient agreed with the plan and demonstrated an understanding of the instructions.   The patient was advised to call back or seek an in-person evaluation if the symptoms worsen or if the condition fails to improve as anticipated.     I provided 24 minutes total of non-face-to-face time during  this encounter including median intraservice time, reviewing previous notes, investigations, ordering medications, medical decision making, coordinating care and patient verbalized understanding at the end of the visit.     Charlott Rakes, MD, FAAFP. [facility] [place], [place]   03/24/2019, 3:35 PM

## 2019-03-24 NOTE — Telephone Encounter (Signed)
At request of Dr Margarita Rana, patient scheduled for follow up at [facility] respiratory clinic 03/25/2019 @ 1900.   Call placed to the patient and informed him of the appointment and provided him with the address for the clinic.  Instructed him to return to the ED if he is not feeling good and he said that he understood

## 2019-03-24 NOTE — Progress Notes (Signed)
Patient has been called and DOB has been verified. Patient has been screened and transferred to PCP to start phone visit.   Still having SOB,and sore throat after COVID Swelling in right arm and legs

## 2019-03-25 ENCOUNTER — Ambulatory Visit (INDEPENDENT_AMBULATORY_CARE_PROVIDER_SITE_OTHER): Payer: Medicare Other | Admitting: Internal Medicine

## 2019-03-25 DIAGNOSIS — N179 Acute kidney failure, unspecified: Secondary | ICD-10-CM

## 2019-03-25 DIAGNOSIS — I10 Essential (primary) hypertension: Secondary | ICD-10-CM

## 2019-03-25 DIAGNOSIS — J1282 Pneumonia due to coronavirus disease 2019: Secondary | ICD-10-CM | POA: Diagnosis not present

## 2019-03-25 DIAGNOSIS — U071 COVID-19: Secondary | ICD-10-CM

## 2019-03-25 DIAGNOSIS — E1043 Type 1 diabetes mellitus with diabetic autonomic (poly)neuropathy: Secondary | ICD-10-CM

## 2019-03-25 DIAGNOSIS — K3184 Gastroparesis: Secondary | ICD-10-CM | POA: Diagnosis not present

## 2019-03-25 DIAGNOSIS — Z8616 Personal history of COVID-19: Secondary | ICD-10-CM | POA: Diagnosis not present

## 2019-03-25 MED ORDER — DEXAMETHASONE 4 MG PO TABS: 4.0000 mg | ORAL_TABLET | Freq: Two times a day (BID) | ORAL | 0 refills | Status: DC

## 2019-03-25 MED ORDER — ALBUTEROL SULFATE HFA 108 (90 BASE) MCG/ACT IN AERS: 2.0000 | INHALATION_SPRAY | Freq: Four times a day (QID) | RESPIRATORY_TRACT | 0 refills | Status: DC | PRN

## 2019-03-25 MED ORDER — AZITHROMYCIN 250 MG PO TABS: ORAL_TABLET | ORAL | 0 refills | Status: DC

## 2019-03-25 MED FILL — PREGABALIN 75 MG CAPS: 75 | 30 days supply | Qty: 60 | Fill #0

## 2019-03-25 MED FILL — ?METOCLOPRAMIDE 10MG TABLET: 10 | 10 days supply | Qty: 30 | Fill #2

## 2019-03-25 NOTE — Patient Instructions (Addendum)
Prescription sent to [place]: #1 albuterol MDI 1 to 2 puffs every 4-6 hours as needed for wheezing or cough #2 azithromycin 250 mg dispense six; 2-day 1 and then 1 daily x4 days #3 Decadron 4 mg twice daily #10

## 2019-03-25 NOTE — Progress Notes (Signed)
Respiratory Clinic Note   Patient: Duane [name] Male    DOB: 1975/07/10   44 y.o.   MRN: [id number] Visit Date: 03/25/2019  Today's Provider: [place]   Chief complaint: Follow-up post hospitalization for COVID-19 infection residual symptoms  Subjective:    Covid-19 Nucleic Acid Test Results Lab Results  Component Value Date   SARSCOV2NAA POSITIVE (A) 02/24/2019   HPI he was hospitalized 1/12-1/20/2021 with Covid infection complicated by acute respiratory failure with hypoxia in the context of CAP.  His course was also complicated by acute vomiting due to diabetic gastroparesis and AKI. This was in the context of type 1 diabetes with complications of peripheral neuropathy and gastroparesis. In the ED pulse was 100 and blood pressure 183/116. For COVID-19 CAP he received a course of IV remdesivir and dexamethasone; this resulted in the ability to wean the supplemental oxygen. Cough and dyspnea resolved with this therapy. He received IV Reglan and Protonix and sucralfate with resolution of vomiting. The hypertensive urgency & hypernatremia (150) resolved with aggressive therapy. AKI progressed resulting in a peak creatinine of 4.27.  Nephrology was consulted.  He was slowly hydrated and the creatinine improved to 3.4.  Creatinine was to be rechecked 1 week post discharge. Insulin was continued for his type 1 diabetes mellitus.  Diabetes was poorly controlled as documented by an A1c of 11.2.  It was noted that the A1c has been over 10 at least since 2017. He did receive post convalescent plasma on 02/25/2019. Significantly he has never smoked and does not drink.  Significant comorbidities in the context of COVID-19 pneumonia include morbid obesity, essential hypertension, dyslipidemia, and the complicated diabetes.   Current Outpatient Medications:  .  atorvastatin (LIPITOR) 20 MG tablet, Take 1 tablet (20 mg total) by mouth daily., Disp: 30 tablet, Rfl: 2 .  Blood Glucose  Monitoring Suppl (TRUE METRIX METER) W/DEVICE KIT, 1 each by Does not apply route 4 (four) times daily -  before meals and at bedtime., Disp: 1 kit, Rfl: ea .  diltiazem (CARTIA XT) 180 MG 24 hr capsule, Take 1 capsule (180 mg total) by mouth daily., Disp: 30 capsule, Rfl: 3 .  DULoxetine (CYMBALTA) 60 MG capsule, Take 1 capsule (60 mg total) by mouth daily., Disp: 30 capsule, Rfl: 3 .  furosemide (LASIX) 40 MG tablet, Take 1.5 tablets (60 mg total) by mouth 2 (two) times daily. (Patient taking differently: Take 40 mg by mouth 2 (two) times daily. ), Disp: 90 tablet, Rfl: 3 .  gabapentin (NEURONTIN) 300 MG capsule, Take 1 capsule (300 mg total) by mouth at bedtime., Disp: 30 capsule, Rfl: 6 .  glucose blood (CONTOUR NEXT TEST) test strip, Use as instructed, Disp: 100 each, Rfl: 12 .  glucose blood (CONTOUR NEXT TEST) test strip, Use as instructed, Disp: 100 each, Rfl: 12 .  glucose blood (TRUE METRIX BLOOD GLUCOSE TEST) test strip, 1 each by Other route 3 (three) times daily., Disp: 100 each, Rfl: 11 .  Insulin Glargine (LANTUS SOLOSTAR) 100 UNIT/ML Solostar Pen, Inject 40 Units into the skin 2 (two) times daily., Disp: 30 mL, Rfl: 3 .  insulin lispro (HUMALOG KWIKPEN) 100 UNIT/ML KwikPen, INJECT 15 UNITS INTO THE SKIN 3 TIMES DAILY. (Patient taking differently: Inject 15 Units into the skin 3 (three) times daily. ), Disp: 15 mL, Rfl: 2 .  Insulin Pen Needle 31G X 8 MM MISC, Inject 1 each into the skin 4 (four) times daily. Check blood sugar TID and QHS, Disp: 120  each, Rfl: 11 .  labetalol (NORMODYNE) 200 MG tablet, Take 1 tablet (200 mg total) by mouth 2 (two) times daily., Disp: 60 tablet, Rfl: 1 .  methocarbamol (ROBAXIN) 500 MG tablet, Take 1 tablet (500 mg total) by mouth every 8 (eight) hours as needed for muscle spasms., Disp: 60 tablet, Rfl: 1 .  metoCLOPramide (REGLAN) 10 MG tablet, Take a half hour before meals (Patient taking differently: Take 10 mg by mouth 3 (three) times daily before  meals. ), Disp: 90 tablet, Rfl: 6 .  pantoprazole (PROTONIX) 40 MG tablet, Take 1 tablet (40 mg total) by mouth daily., Disp: 60 tablet, Rfl: 6 .  potassium chloride (MICRO-K) 10 MEQ CR capsule, Take 1 capsule (10 mEq total) by mouth daily., Disp: 30 capsule, Rfl: 3 .  pregabalin (LYRICA) 75 MG capsule, Take 1 capsule (75 mg total) by mouth 2 (two) times daily., Disp: 60 capsule, Rfl: 3 .  promethazine (PHENERGAN) 25 MG tablet, TAKE 1 TABLET BY MOUTH EVERY 8 HOURS AS NEEDED FOR NAUSEA OR VOMITING. (Patient taking differently: Take 25 mg by mouth every 8 (eight) hours as needed for nausea or vomiting. ), Disp: 40 tablet, Rfl: 1 .  sucralfate (CARAFATE) 1 g tablet, TAKE 1 TABLET (1 G TOTAL) BY MOUTH 4 (FOUR) TIMES DAILY., Disp: 120 tablet, Rfl: 1 .  terbinafine (LAMISIL) 250 MG tablet, Take 1 tablet (250 mg total) by mouth daily., Disp: 90 tablet, Rfl: 0 .  TRUEPLUS LANCETS 28G MISC, 1 each by Does not apply route 4 (four) times daily -  before meals and at bedtime., Disp: 100 each, Rfl: 12 .  XIFAXAN 550 MG TABS tablet, TAKE 1 TABLET (550 MG TOTAL) BY MOUTH 3 (THREE) TIMES DAILY. (Patient taking differently: Take 550 mg by mouth daily as needed (Bacterial infrction in stomach). ), Disp: 30 tablet, Rfl: 2 .  Pancrelipase, Lip-Prot-Amyl, (ZENPEP) 40000-126000 units CPEP, Take 2 capsules by mouth 3 (three) times daily with meals. Take during meal (Patient not taking: Reported on 03/24/2019), Disp: 180 capsule, Rfl: 11 .  Peppermint Oil (IBGARD) 90 MG CPCR, Take 2 capsules by mouth 3 (three) times daily as needed. (Patient not taking: Reported on 03/25/2019), Disp: 16 capsule, Rfl: 0  No Known Allergies  Review of Systems Active symptoms include fatigue and tiredness; these have improved but persist.  He has noted some wheezing as well as exertional dyspnea.  Cough has been productive of yellow phlegm intermittently.  Other symptoms include myalgias and also dizziness. He states this is glucoses have ranged  from 130-290 fasting.  He has had no hypoglycemia.  Positive review of systems for Covid infection are documented above . Not present are: Constitutional: Fever,  chills HEENT: Eye redness and discharge (conjunctivitis), nasal congestion, sore throat, anosmia, and altered taste Pulmonary:  tachypnea, tachycardia, hemoptysis, and chest pain GI: Anorexia, nausea, vomiting, diarrhea Genitourinary: Oliguria or anuria Dermatologic: Rash Neurologic: Headache,  mental status changes    Objective:   BP 124/70   Pulse 83   Temp 99.6 F (37.6 C)   Wt (!) 354 lb (160.6 kg)   SpO2 99%   BMI 43.09 kg/m   Physical Exam Pertinent or positive findings: he is alert, oriented, and communicable. He is morbidly obese. Nares are boggy.  He has multiple carious teeth.  Abdomen is massive.  Bowel sounds are decreased.  There was no tenderness to palpation.  He has 1+ edema at the sock line.  General appearance: no acute distress, increased work of breathing  is present.   Lymphatic: No lymphadenopathy about the head, neck, axilla. Eyes: No conjunctival inflammation or lid edema is present. There is no scleral icterus. Ears:  External ear exam shows no significant lesions or deformities.   Nose:  External nasal examination shows no deformity or inflammation.  Oral exam: There is no oropharyngeal erythema or exudate. Neck:  No thyromegaly, masses, tenderness noted.    Heart:  Normal rate and regular rhythm. S1 and S2 normal without gallop, murmur, click, rub .  Lungs: Chest clear to auscultation without wheezes, rhonchi, rales, rubs. Abdomen:  no organomegaly, hernias, masses. GU: Deferred  Extremities:  No cyanosis, clubbing  Skin: Warm & dry w/o tenting. No significant lesions or rash.   No results found for any visits on 03/25/19.    Assessment & Plan    #1 Covid 19 CAP complicated by acute respiratory failure with hypoxia. S/P convalescent plasma 1/13. Residual fatigue ,DOE,wheezing,  productive cough,myalgias & dizziness #2 AKI superimposed on CKD stage III; improved but monitor necessary #3 poorly controlled type 1 diabetes with severe gastroparesis symptoms of nausea and vomiting which have essentially resolved #4 hypertensive urgency, improved Plan: As per [facility] her protocol Decadron and azithromycin will be ordered with albuterol to treat any bronchospasm.  Doxycycline was not initiated because of his GI symptomatology as doxycycline may negatively affect the gastroesophageal sphincter pressure.    [place]

## 2019-03-26 MED FILL — DEXAMETHASONE 4 MG TABLET: 4 | 5 days supply | Qty: 10 | Fill #0

## 2019-03-26 MED FILL — !PROVENTIL HFA 90 MCG INH: 108 (90 BAS | 25 days supply | Qty: 7 | Fill #0

## 2019-03-26 MED FILL — AZITHROMYCIN 250 MG TABLET: 250 | 5 days supply | Qty: 6 | Fill #0

## 2019-03-26 NOTE — Assessment & Plan Note (Signed)
Hypertensive urgency has resolved; blood pressure is well controlled at present

## 2019-03-26 NOTE — Assessment & Plan Note (Signed)
As per [facility] protocol short course Decadron and azithromycin will be added because of persistent symptomatology. Albuterol ordered for cough and exertional dyspnea.

## 2019-03-26 NOTE — Assessment & Plan Note (Signed)
Nephrology consulted while hospitalized.  Creatinine peaked at 4.27; 3.4 at discharge. BMET monitor recommended post discharge at the [facility]

## 2019-03-26 NOTE — Assessment & Plan Note (Addendum)
Symptoms adequately controlled on present regimen

## 2019-03-31 ENCOUNTER — Other Ambulatory Visit: Payer: Self-pay | Admitting: Family Medicine

## 2019-03-31 DIAGNOSIS — E1042 Type 1 diabetes mellitus with diabetic polyneuropathy: Secondary | ICD-10-CM

## 2019-03-31 DIAGNOSIS — I10 Essential (primary) hypertension: Secondary | ICD-10-CM

## 2019-03-31 MED FILL — ?BASAGLAR 100 UNITS/ML KWPE: 100 | 33 days supply | Qty: 27 | Fill #3

## 2019-03-31 MED FILL — ?ATORVASTATIN 20 MG TABLET: 20 | 30 days supply | Qty: 30 | Fill #2

## 2019-04-01 MED FILL — DILTIAZEM 24HR ER 180 MG CA: 180 | 30 days supply | Qty: 30 | Fill #0

## 2019-04-01 MED FILL — ?HUMALOG 100 UNITS/ML KWIKP: 100 | 33 days supply | Qty: 15 | Fill #0

## 2019-04-08 MED FILL — PANTOPRAZOLE SOD DR 40 MG T: 40 | 30 days supply | Qty: 60 | Fill #1

## 2019-04-08 MED FILL — METOCLOPRAMIDE 10 MG TABLET: 10 | 10 days supply | Qty: 30 | Fill #3

## 2019-04-09 ENCOUNTER — Ambulatory Visit (INDEPENDENT_AMBULATORY_CARE_PROVIDER_SITE_OTHER): Payer: Medicare Other | Admitting: Internal Medicine

## 2019-04-09 ENCOUNTER — Encounter: Payer: Self-pay | Admitting: Internal Medicine

## 2019-04-09 ENCOUNTER — Telehealth: Payer: Self-pay | Admitting: Internal Medicine

## 2019-04-09 ENCOUNTER — Other Ambulatory Visit: Payer: Self-pay

## 2019-04-09 VITALS — BP 150/78 | HR 87 | Temp 97.0°F | Ht 76.0 in | Wt 373.4 lb

## 2019-04-09 DIAGNOSIS — N184 Chronic kidney disease, stage 4 (severe): Secondary | ICD-10-CM

## 2019-04-09 DIAGNOSIS — R0602 Shortness of breath: Secondary | ICD-10-CM

## 2019-04-09 DIAGNOSIS — J1282 Pneumonia due to coronavirus disease 2019: Secondary | ICD-10-CM | POA: Diagnosis not present

## 2019-04-09 DIAGNOSIS — U071 COVID-19: Secondary | ICD-10-CM | POA: Diagnosis not present

## 2019-04-09 LAB — BASIC METABOLIC PANEL
BUN: 21 mg/dL (ref 6–23)
CO2: 25 mEq/L (ref 19–32)
Calcium: 8.6 mg/dL (ref 8.4–10.5)
Chloride: 106 mEq/L (ref 96–112)
Creatinine, Ser: 2.3 mg/dL — ABNORMAL HIGH (ref 0.40–1.50)
GFR: 37.82 mL/min — ABNORMAL LOW (ref >60.00)
Glucose, Bld: 157 mg/dL — ABNORMAL HIGH (ref 70–99)
Potassium: 4.3 mEq/L (ref 3.5–5.1)
Sodium: 139 mEq/L (ref 135–145)

## 2019-04-09 LAB — BRAIN NATRIURETIC PEPTIDE: Pro B Natriuretic peptide (BNP): 153 pg/mL — ABNORMAL HIGH (ref 0.0–100.0)

## 2019-04-09 MED ORDER — ALBUTEROL SULFATE HFA 108 (90 BASE) MCG/ACT IN AERS: 2.0000 | INHALATION_SPRAY | Freq: Four times a day (QID) | RESPIRATORY_TRACT | 2 refills | Status: AC | PRN

## 2019-04-09 MED FILL — ALBUTEROL SULFATE HFA 108 (: 108 (90 BAS | 25 days supply | Qty: 18 | Fill #0

## 2019-04-09 NOTE — Telephone Encounter (Signed)
Called and spoke with Patient.  Dr Mauricio Po recommendations given.  Understanding stated.  Nothing further at this time.

## 2019-04-09 NOTE — Patient Instructions (Addendum)
The patient should have follow up scheduled with myself or APP in 6 weeks.   Prior to next visit patient should have: Spirometry/Feno   Get your blood work drawn today. I will contact you with the results. You will need to start taking lasix again. I am also ordering some breathing testing for you.   Take the albuterol rescue inhaler every 4 to 6 hours as needed for wheezing or shortness of breath. You can also take it 15 minutes before exercise or exertional activity. Side effects include heart racing or pounding, jitters or anxiety. If you have a history of an irregular heart rhythm, it can make this worse. Can also give some patients a hard time sleeping.  To inhale the aerosol using an inhaler, follow these steps:  1. Remove the protective dust cap from the end of the mouthpiece. If the dust cap was not placed on the mouthpiece, check the mouthpiece for dirt or other objects. Be sure that the canister is fully and firmly inserted in the mouthpiece. 2. If you are using the inhaler for the first time or if you have not used the inhaler in more than 14 days, you will need to prime it. You may also need to prime the inhaler if it has been dropped. Ask your pharmacist or check the manufacturer's information if this happens. To prime the inhaler, shake it well and then press down on the canister 4 times to release 4 sprays into the air, away from your face. Be careful not to get albuterol in your eyes. 3. Shake the inhaler well. 4. Breathe out as completely as possible through your mouth. 4. Hold the canister with the mouthpiece on the bottom, facing you and the canister pointing upward. Place the open end of the mouthpiece into your mouth. Close your lips tightly around the mouthpiece. 6. Breathe in slowly and deeply through the mouthpiece.At the same time, press down once on the container to spray the medication into your mouth. 7. Try to hold your breath for 10 seconds. remove the inhaler,  and breathe out slowly. 8. If you were told to use 2 puffs, wait 1 minute and then repeat steps 3-7. 9. Replace the protective cap on the inhaler. 10. Clean your inhaler regularly. Follow the manufacturer's directions carefully and ask your doctor or pharmacist if you have any questions about cleaning your inhaler.  Check the back of the inhaler to keep track of the total number of doses left on the inhaler.

## 2019-04-09 NOTE — Addendum Note (Signed)
Addended by: Suzzanne Cloud E on: 04/09/2019 03:02 PM   Modules accepted: Orders

## 2019-04-09 NOTE — Telephone Encounter (Signed)
Renal function is improving since discharge from hospital. I would like him to start taking the lasix again 60 mg (1.5 tabs) just like he was taking before. This will help the fluid and his breathing.

## 2019-04-09 NOTE — Progress Notes (Signed)
Duane [name]    [id number]    05-Aug-1975  Primary Care Physician:[place], Charlane Ferretti, MD Date of Appointment: 04/09/2019 New Patient Evaluation, Self Referred  Chief complaint:   Chief Complaint  Patient presents with  . Consult    Patient is here to establish care after having Covid in Jan. Patient is having shortness of breath with exertion, wheezing and when laying down. Patient states that he still has fluid on his body from when he was hospitalized. Patient went to respiratory clinic 2 weeks ago. Patient was given rescue inhaler but it doesn't help for very long   HPI: Duane [name] is a 44 year old gentleman with a recent diagnosis of COVID-19 pneumonia requiring hospitalization.  He has additional past medical history of type 1 diabetes mellitus gastroparesis.  Discharged on January 20, his hospital stay was complicated by acute kidney injury, he was treated with Decadron, remdesivir, and convalescent plasma.  Was briefly on oxygen but was weaned off.  Stopped taking lasix at discharge. Feels like he has 20 lbs of fluid on.   Was seen in respiratory clinic and given azithromycin, steroids and a rescue inhaler. Has now completed the course. Rescue inhaler helps for a short while - lasts about an hour. Dyspnea on minimal exertion like walking from waiting room to the office room.  Coughing is worse when he lays down, makes it hard for him to sleep. Occasionally with yellow phlegm. Sleeps better sitting up. Has wheezing in his chest. Dry throat. No post nasal drainage. Sinus congestion is present.  Feeling gradually worse he thinks since discharge.  Had recurrent bronchitis in childhood. Never required hospitalization but did have recurrent ED visits. Was on inhalers in the past.   Family history of asthma in cousins, not his parents or kids.   Social History:  Occupation: on disability for diabetes.  Exposures: lives at home alone, no pets.  Smoking history: passive smoke  exposure in parents and grandparents in childhood. Never smoker.   Social History   Occupational History  . 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  difficult to work due to n/v.   Tobacco Use  . Smoking status: Never Smoker  . Smokeless tobacco: Never Used  Substance and Sexual Activity  . Alcohol use: No  . Drug use: No  . Sexual activity: Not on file    Relevant family history: Family History  Problem Relation Age of Onset  . Diabetes Father   . Diabetes Maternal Grandmother   . Diabetes Paternal Grandmother   . Colon cancer Cousin   . Asthma Cousin   . Pancreatic cancer Paternal Grandfather   . Lung cancer Paternal Grandfather        mets from pancreas  . Stomach cancer Neg Hx   . Rectal cancer Neg Hx   . Esophageal cancer Neg Hx     Past Medical History:  Diagnosis Date  . Allergy   . Arthritis   . Chronic diarrhea   . Chronic kidney disease   . Depression   . Diabetes mellitus without complication [facility]) age 70   insulin requiring from the start  . Diabetic neuropathy ([place]) Dx 20110  . Diabetic proliferative retinopathy ([place]) 08/02/2017  . Gastritis   . Gastroparesis 2012   100% gastric retention on 08/2013 [facility]  . GERD (gastroesophageal reflux disease) Dx 2012  . Hx of adenomatous polyp of colon 10/05/2018  . Hyperlipidemia   . Hypertension Dx 2012  .  Neuromuscular disorder ([place])   . Obesity     Past Surgical History:  Procedure Laterality Date  . CHOLECYSTECTOMY  2013  . ESOPHAGOGASTRODUODENOSCOPY N/A 06/11/2013   Procedure: ESOPHAGOGASTRODUODENOSCOPY (EGD);  Surgeon: [name], MD;  Location: [facility];  Service: Endoscopy;  Laterality: N/A;  . EYE SURGERY Left   . FLEXIBLE SIGMOIDOSCOPY N/A 06/11/2013   Procedure: FLEXIBLE SIGMOIDOSCOPY;  Surgeon: [name], MD;  Location: [facility] ENDOSCOPY;  Service: Endoscopy;  Laterality: N/A;  . KNEE SURGERY Left     Review of systems: Review of Systems  Constitutional: Negative for  chills, fever and weight loss.  HENT: Positive for congestion. Negative for sinus pain and sore throat.   Eyes: Negative for discharge and redness.  Respiratory: Positive for cough, shortness of breath and wheezing. Negative for hemoptysis and sputum production.   Cardiovascular: Negative for chest pain, palpitations and leg swelling.  Gastrointestinal: Positive for nausea and vomiting. Negative for heartburn.  Musculoskeletal: Negative for joint pain and myalgias.  Skin: Negative for rash.  Neurological: Negative for dizziness, tremors, focal weakness and headaches.  Endo/Heme/Allergies: Negative for environmental allergies.  Psychiatric/Behavioral: Negative for depression. The patient is not nervous/anxious.   All other systems reviewed and are negative.   Physical Exam: Blood pressure (!) 150/78, pulse 87, temperature (!) 97 F (36.1 C), temperature source Temporal, height 6' 4" (1.93 m), weight (!) 373 lb 6.4 oz (169.4 kg), SpO2 95 %. Gen:      No acute distress, overweight ENT:  no nasal polyps, mucus membranes moist mild nasal debris Lungs:    Diminished, no increased respiratory effort, symmetric chest wall excursion, clear to auscultation bilaterally, no wheezes or crackles CV:         Regular rate and rhythm; no murmurs, rubs, or gallops.  No pedal edema MSK: no acute synovitis of DIP or PIP joints, no mechanics hands.  Ext:  2+ pitting edema in is upper and lower extremities.  Neuro: normal speech, no focal facial asymmetry Psych: alert and oriented x3, normal mood and affect  Data Reviewed/Medical Decision Making:  Independent interpretation of tests: Imaging: . Review of patient's chest x-ray from January 2021.  No acute cardiopulmonary process, no effusion, no pneumonia. The patient's images have been independently reviewed by me.    Labs:  Lab Results  Component Value Date   WBC 9.2 02/25/2019   HGB 12.4 (L) 02/25/2019   HCT 41.1 02/25/2019   MCV 87.1 02/25/2019    PLT 291 02/25/2019     Chemistry      Component Value Date/Time   NA 142 03/03/2019 0144   NA 141 01/01/2018 1627   K 3.8 03/03/2019 0144   CL 114 (H) 03/03/2019 0144   CO2 21 (L) 03/03/2019 0144   BUN 63 (H) 03/03/2019 0144   BUN 17 01/01/2018 1627   CREATININE 3.40 (H) 03/03/2019 0144   CREATININE 1.45 (H) 03/30/2016 1723      Component Value Date/Time   CALCIUM 7.9 (L) 03/03/2019 0144   ALKPHOS 73 02/25/2019 0259   AST 31 02/25/2019 0259   ALT 21 02/25/2019 0259   BILITOT 1.1 02/25/2019 0259   BILITOT <0.2 01/01/2018 1627     Immunization status:  Immunization History  Administered Date(s) Administered  . Influenza,inj,Quad PF,6+ Mos 01/16/2017, 12/23/2017    . I reviewed prior external note(s) from Dr. Wynelle Cleveland, Dr. Linna Darner . I reviewed the result(s) of the labs and imaging as noted above.  . I have ordered PFTs  Assessment:  Shortness of breath Recent COVID-19 pneumonia AKI on CKD  Plan/Recommendations: Mr. Laurel is a history of type 1 diabetes and presents with persistent dyspnea following COVID-19 pneumonia. I will communicate with Dr. Carolin Sicks and Dr. Margarita Rana.  He very badly needs fluid off of high doses of steroids, IV fluids in the hospital, and discontinuation of Lasix at discharge.. I do not think he is severe enough to require hospitalization for diuresis at this time.  We will get a be met to check his kidney function.  If that looks okay I will resume his Lasix 80 mg once a day.  He should have follow-up with myself or an APP in 6 weeks.  Because of his COVID-19 status will need to wait 90 days before obtaining any kind of PFTs.  If his symptoms are not improved with following diuresis it is reasonable to empirically try an ICS given his history of bronchitis.  We discussed disease management and progression at length today.   Follow up with Dr. Carolynne Edouard in Nephrology ASAP.  Return in about 6 weeks (around 05/21/2019).   Lenice Llamas, MD Pulmonary and  [place]

## 2019-04-09 NOTE — Addendum Note (Signed)
Addended by: June Leap on: 04/09/2019 03:00 PM   Modules accepted: Orders

## 2019-04-09 NOTE — Addendum Note (Signed)
Addended by: Suzzanne Cloud E on: 04/09/2019 03:05 PM   Modules accepted: Orders

## 2019-04-09 NOTE — Addendum Note (Signed)
Addended by: June Leap on: 04/09/2019 02:48 PM   Modules accepted: Orders

## 2019-04-09 NOTE — Addendum Note (Signed)
Addended by: Suzzanne Cloud E on: 04/09/2019 03:11 PM   Modules accepted: Orders

## 2019-04-09 NOTE — Addendum Note (Signed)
Addended by: Suzzanne Cloud E on: 04/09/2019 02:40 PM   Modules accepted: Orders

## 2019-04-14 ENCOUNTER — Telehealth: Payer: Self-pay | Admitting: Family Medicine

## 2019-04-14 NOTE — Telephone Encounter (Signed)
I called the patient due to above-named complains and he is dyspneic at rest and is unable to speak in full sentences.  He got better after being seen at the respiratory clinic but symptoms returned when he completed his course of antibiotic and prednisone.  He is also on Lasix 60 mg twice daily and walking from the house to his car getting short of breath.  I have referred him to the ED for further evaluation.

## 2019-04-14 NOTE — Telephone Encounter (Signed)
Patient called saying he is still having respiratory problems due to [place] and would like to speak with the nurse. Please f/u

## 2019-04-15 ENCOUNTER — Other Ambulatory Visit: Payer: Self-pay

## 2019-04-15 ENCOUNTER — Inpatient Hospital Stay (HOSPITAL_COMMUNITY)
Admission: EM | Admit: 2019-04-15 | Discharge: 2019-04-23 | DRG: 291 | Disposition: A | Discharge status: Home / Self Care | Payer: Medicare Other | Attending: Internal Medicine | Admitting: Internal Medicine

## 2019-04-15 ENCOUNTER — Emergency Department (HOSPITAL_COMMUNITY): Payer: Medicare Other

## 2019-04-15 ENCOUNTER — Encounter (HOSPITAL_COMMUNITY): Payer: Self-pay

## 2019-04-15 DIAGNOSIS — E8809 Other disorders of plasma-protein metabolism, not elsewhere classified: Secondary | ICD-10-CM | POA: Diagnosis present

## 2019-04-15 DIAGNOSIS — I248 Other forms of acute ischemic heart disease: Secondary | ICD-10-CM | POA: Diagnosis present

## 2019-04-15 DIAGNOSIS — E108 Type 1 diabetes mellitus with unspecified complications: Secondary | ICD-10-CM | POA: Diagnosis not present

## 2019-04-15 DIAGNOSIS — G4733 Obstructive sleep apnea (adult) (pediatric): Secondary | ICD-10-CM | POA: Diagnosis present

## 2019-04-15 DIAGNOSIS — E1042 Type 1 diabetes mellitus with diabetic polyneuropathy: Secondary | ICD-10-CM | POA: Diagnosis present

## 2019-04-15 DIAGNOSIS — I5043 Acute on chronic combined systolic (congestive) and diastolic (congestive) heart failure: Secondary | ICD-10-CM | POA: Diagnosis present

## 2019-04-15 DIAGNOSIS — I10 Essential (primary) hypertension: Secondary | ICD-10-CM | POA: Diagnosis not present

## 2019-04-15 DIAGNOSIS — N1832 Chronic kidney disease, stage 3b: Secondary | ICD-10-CM | POA: Diagnosis present

## 2019-04-15 DIAGNOSIS — Z833 Family history of diabetes mellitus: Secondary | ICD-10-CM

## 2019-04-15 DIAGNOSIS — D631 Anemia in chronic kidney disease: Secondary | ICD-10-CM | POA: Diagnosis present

## 2019-04-15 DIAGNOSIS — R0602 Shortness of breath: Secondary | ICD-10-CM | POA: Diagnosis not present

## 2019-04-15 DIAGNOSIS — E1043 Type 1 diabetes mellitus with diabetic autonomic (poly)neuropathy: Secondary | ICD-10-CM | POA: Diagnosis present

## 2019-04-15 DIAGNOSIS — I313 Pericardial effusion (noninflammatory): Secondary | ICD-10-CM | POA: Diagnosis present

## 2019-04-15 DIAGNOSIS — Z8616 Personal history of COVID-19: Secondary | ICD-10-CM

## 2019-04-15 DIAGNOSIS — R0989 Other specified symptoms and signs involving the circulatory and respiratory systems: Secondary | ICD-10-CM | POA: Diagnosis not present

## 2019-04-15 DIAGNOSIS — Z794 Long term (current) use of insulin: Secondary | ICD-10-CM | POA: Diagnosis not present

## 2019-04-15 DIAGNOSIS — R609 Edema, unspecified: Secondary | ICD-10-CM

## 2019-04-15 DIAGNOSIS — E785 Hyperlipidemia, unspecified: Secondary | ICD-10-CM | POA: Diagnosis present

## 2019-04-15 DIAGNOSIS — N183 Chronic kidney disease, stage 3 unspecified: Secondary | ICD-10-CM | POA: Diagnosis not present

## 2019-04-15 DIAGNOSIS — R600 Localized edema: Secondary | ICD-10-CM

## 2019-04-15 DIAGNOSIS — Z79899 Other long term (current) drug therapy: Secondary | ICD-10-CM

## 2019-04-15 DIAGNOSIS — Z8701 Personal history of pneumonia (recurrent): Secondary | ICD-10-CM | POA: Diagnosis not present

## 2019-04-15 DIAGNOSIS — F329 Major depressive disorder, single episode, unspecified: Secondary | ICD-10-CM | POA: Diagnosis present

## 2019-04-15 DIAGNOSIS — IMO0002 Reserved for concepts with insufficient information to code with codable children: Secondary | ICD-10-CM | POA: Diagnosis present

## 2019-04-15 DIAGNOSIS — Z9049 Acquired absence of other specified parts of digestive tract: Secondary | ICD-10-CM

## 2019-04-15 DIAGNOSIS — I5033 Acute on chronic diastolic (congestive) heart failure: Secondary | ICD-10-CM | POA: Diagnosis not present

## 2019-04-15 DIAGNOSIS — R778 Other specified abnormalities of plasma proteins: Secondary | ICD-10-CM | POA: Diagnosis not present

## 2019-04-15 DIAGNOSIS — N179 Acute kidney failure, unspecified: Secondary | ICD-10-CM | POA: Diagnosis not present

## 2019-04-15 DIAGNOSIS — I130 Hypertensive heart and chronic kidney disease with heart failure and stage 1 through stage 4 chronic kidney disease, or unspecified chronic kidney disease: Principal | ICD-10-CM | POA: Diagnosis present

## 2019-04-15 DIAGNOSIS — E1065 Type 1 diabetes mellitus with hyperglycemia: Secondary | ICD-10-CM | POA: Diagnosis present

## 2019-04-15 DIAGNOSIS — K219 Gastro-esophageal reflux disease without esophagitis: Secondary | ICD-10-CM | POA: Diagnosis present

## 2019-04-15 DIAGNOSIS — K580 Irritable bowel syndrome with diarrhea: Secondary | ICD-10-CM

## 2019-04-15 DIAGNOSIS — E1022 Type 1 diabetes mellitus with diabetic chronic kidney disease: Secondary | ICD-10-CM | POA: Diagnosis present

## 2019-04-15 DIAGNOSIS — Z6841 Body Mass Index (BMI) 40.0 and over, adult: Secondary | ICD-10-CM | POA: Diagnosis not present

## 2019-04-15 DIAGNOSIS — K3184 Gastroparesis: Secondary | ICD-10-CM | POA: Diagnosis present

## 2019-04-15 DIAGNOSIS — I5023 Acute on chronic systolic (congestive) heart failure: Secondary | ICD-10-CM | POA: Diagnosis not present

## 2019-04-15 DIAGNOSIS — E1122 Type 2 diabetes mellitus with diabetic chronic kidney disease: Secondary | ICD-10-CM | POA: Diagnosis present

## 2019-04-15 DIAGNOSIS — N189 Chronic kidney disease, unspecified: Secondary | ICD-10-CM | POA: Diagnosis not present

## 2019-04-15 DIAGNOSIS — Z7952 Long term (current) use of systemic steroids: Secondary | ICD-10-CM

## 2019-04-15 DIAGNOSIS — K529 Noninfective gastroenteritis and colitis, unspecified: Secondary | ICD-10-CM | POA: Diagnosis present

## 2019-04-15 DIAGNOSIS — R801 Persistent proteinuria, unspecified: Secondary | ICD-10-CM | POA: Diagnosis not present

## 2019-04-15 LAB — BASIC METABOLIC PANEL
Anion gap: 8 (ref 5–15)
BUN: 19 mg/dL (ref 6–20)
CO2: 23 mmol/L (ref 22–32)
Calcium: 8.4 mg/dL — ABNORMAL LOW (ref 8.9–10.3)
Chloride: 108 mmol/L (ref 98–111)
Creatinine, Ser: 2 mg/dL — ABNORMAL HIGH (ref 0.61–1.24)
GFR calc Af Amer: 46 mL/min — ABNORMAL LOW (ref >60)
GFR calc non Af Amer: 40 mL/min — ABNORMAL LOW (ref >60)
Glucose, Bld: 237 mg/dL — ABNORMAL HIGH (ref 70–99)
Potassium: 4.5 mmol/L (ref 3.5–5.1)
Sodium: 139 mmol/L (ref 135–145)

## 2019-04-15 LAB — MAGNESIUM: Magnesium: 1.9 mg/dL (ref 1.7–2.4)

## 2019-04-15 LAB — CBC
HCT: 31.7 % — ABNORMAL LOW (ref 39.0–52.0)
Hemoglobin: 9.7 g/dL — ABNORMAL LOW (ref 13.0–17.0)
MCH: 26.8 pg (ref 26.0–34.0)
MCHC: 30.6 g/dL (ref 30.0–36.0)
MCV: 87.6 fL (ref 80.0–100.0)
Platelets: 306 10*3/uL (ref 150–400)
RBC: 3.62 MIL/uL — ABNORMAL LOW (ref 4.22–5.81)
RDW: 14.2 % (ref 11.5–15.5)
WBC: 7 10*3/uL (ref 4.0–10.5)
nRBC: 0 % (ref 0.0–0.2)

## 2019-04-15 LAB — BRAIN NATRIURETIC PEPTIDE: B Natriuretic Peptide: 135 pg/mL — ABNORMAL HIGH (ref 0.0–100.0)

## 2019-04-15 LAB — TROPONIN I (HIGH SENSITIVITY): Troponin I (High Sensitivity): 25 ng/L — ABNORMAL HIGH (ref <18)

## 2019-04-15 MED ORDER — FUROSEMIDE 10 MG/ML IJ SOLN
60.0000 mg | Freq: Once | INTRAMUSCULAR | Status: AC
  Administered 2019-04-15: 60 mg via INTRAVENOUS
  Filled 2019-04-15: qty 8

## 2019-04-15 MED ORDER — SODIUM CHLORIDE 0.9% FLUSH
3.0000 mL | Freq: Two times a day (BID) | INTRAVENOUS | Status: DC
  Administered 2019-04-16 – 2019-04-23 (×14): 3 mL via INTRAVENOUS

## 2019-04-15 MED ORDER — LABETALOL HCL 200 MG PO TABS
200.0000 mg | ORAL_TABLET | Freq: Two times a day (BID) | ORAL | Status: DC
  Administered 2019-04-16 (×2): 200 mg via ORAL
  Filled 2019-04-15 (×2): qty 1

## 2019-04-15 MED ORDER — METOCLOPRAMIDE HCL 10 MG PO TABS
10.0000 mg | ORAL_TABLET | Freq: Three times a day (TID) | ORAL | Status: DC
  Administered 2019-04-16 – 2019-04-23 (×23): 10 mg via ORAL
  Filled 2019-04-15 (×22): qty 1

## 2019-04-15 MED ORDER — PREGABALIN 50 MG PO CAPS
75.0000 mg | ORAL_CAPSULE | Freq: Two times a day (BID) | ORAL | Status: DC
  Administered 2019-04-16 – 2019-04-23 (×16): 75 mg via ORAL
  Filled 2019-04-15 (×16): qty 1

## 2019-04-15 MED ORDER — ENOXAPARIN SODIUM 80 MG/0.8ML ~~LOC~~ SOLN
80.0000 mg | SUBCUTANEOUS | Status: DC
  Administered 2019-04-16 – 2019-04-23 (×8): 80 mg via SUBCUTANEOUS
  Filled 2019-04-15 (×8): qty 0.8

## 2019-04-15 MED ORDER — ACETAMINOPHEN 325 MG PO TABS
650.0000 mg | ORAL_TABLET | Freq: Four times a day (QID) | ORAL | Status: DC | PRN
  Administered 2019-04-16 – 2019-04-23 (×8): 650 mg via ORAL
  Filled 2019-04-15 (×8): qty 2

## 2019-04-15 MED ORDER — POTASSIUM CHLORIDE CRYS ER 10 MEQ PO TBCR
10.0000 meq | EXTENDED_RELEASE_TABLET | Freq: Every day | ORAL | Status: DC
  Administered 2019-04-16 – 2019-04-19 (×4): 10 meq via ORAL
  Filled 2019-04-15 (×4): qty 1

## 2019-04-15 MED ORDER — INSULIN GLARGINE 100 UNIT/ML ~~LOC~~ SOLN
20.0000 [IU] | Freq: Two times a day (BID) | SUBCUTANEOUS | Status: DC
  Administered 2019-04-16 – 2019-04-23 (×16): 20 [IU] via SUBCUTANEOUS
  Filled 2019-04-15 (×18): qty 0.2

## 2019-04-15 MED ORDER — DULOXETINE HCL 30 MG PO CPEP
60.0000 mg | ORAL_CAPSULE | Freq: Every day | ORAL | Status: DC
  Administered 2019-04-16 – 2019-04-23 (×8): 60 mg via ORAL
  Filled 2019-04-15 (×8): qty 2

## 2019-04-15 MED ORDER — INSULIN ASPART 100 UNIT/ML ~~LOC~~ SOLN
0.0000 [IU] | Freq: Every day | SUBCUTANEOUS | Status: DC
  Administered 2019-04-19 – 2019-04-21 (×2): 2 [IU] via SUBCUTANEOUS

## 2019-04-15 MED ORDER — ATORVASTATIN CALCIUM 20 MG PO TABS
20.0000 mg | ORAL_TABLET | Freq: Every day | ORAL | Status: DC
  Administered 2019-04-16 – 2019-04-22 (×7): 20 mg via ORAL
  Filled 2019-04-15 (×7): qty 1

## 2019-04-15 MED ORDER — SODIUM CHLORIDE 0.9% FLUSH
3.0000 mL | Freq: Once | INTRAVENOUS | Status: AC
  Administered 2019-04-15: 3 mL via INTRAVENOUS

## 2019-04-15 MED ORDER — INSULIN ASPART 100 UNIT/ML ~~LOC~~ SOLN
0.0000 [IU] | Freq: Three times a day (TID) | SUBCUTANEOUS | Status: DC
  Administered 2019-04-16 (×2): 3 [IU] via SUBCUTANEOUS
  Administered 2019-04-16 – 2019-04-17 (×2): 2 [IU] via SUBCUTANEOUS
  Administered 2019-04-17 (×2): 5 [IU] via SUBCUTANEOUS
  Administered 2019-04-18: 2 [IU] via SUBCUTANEOUS
  Administered 2019-04-18 – 2019-04-19 (×3): 3 [IU] via SUBCUTANEOUS
  Administered 2019-04-19: 5 [IU] via SUBCUTANEOUS
  Administered 2019-04-19 – 2019-04-20 (×3): 3 [IU] via SUBCUTANEOUS
  Administered 2019-04-21: 5 [IU] via SUBCUTANEOUS
  Administered 2019-04-21: 2 [IU] via SUBCUTANEOUS
  Administered 2019-04-21 – 2019-04-22 (×3): 3 [IU] via SUBCUTANEOUS
  Administered 2019-04-23: 5 [IU] via SUBCUTANEOUS

## 2019-04-15 MED ORDER — DILTIAZEM HCL ER COATED BEADS 180 MG PO CP24
180.0000 mg | ORAL_CAPSULE | Freq: Every day | ORAL | Status: DC
  Administered 2019-04-16 – 2019-04-18 (×3): 180 mg via ORAL
  Filled 2019-04-15 (×3): qty 1

## 2019-04-15 MED ORDER — FUROSEMIDE 10 MG/ML IJ SOLN
40.0000 mg | Freq: Two times a day (BID) | INTRAMUSCULAR | Status: DC
  Administered 2019-04-16 – 2019-04-17 (×3): 40 mg via INTRAVENOUS
  Filled 2019-04-15 (×3): qty 4

## 2019-04-15 MED ORDER — PANTOPRAZOLE SODIUM 40 MG PO TBEC
40.0000 mg | DELAYED_RELEASE_TABLET | Freq: Every day | ORAL | Status: DC
  Administered 2019-04-16 – 2019-04-23 (×8): 40 mg via ORAL
  Filled 2019-04-15 (×8): qty 1

## 2019-04-15 MED ORDER — ACETAMINOPHEN 650 MG RE SUPP: 650.0000 mg | Freq: Four times a day (QID) | RECTAL | Status: DC | PRN

## 2019-04-15 NOTE — H&P (Signed)
History and Physical    PLEASE NOTE THAT DRAGON DICTATION SOFTWARE WAS USED IN THE CONSTRUCTION OF THIS NOTE.   Duane [name] IDC:[id number] DOB: 03-29-1975 DOA: 04/15/2019  PCP: Charlott Rakes, MD Patient coming from: home   I have personally briefly reviewed patient's old medical records in [place]  Chief Complaint: Shortness of breath  HPI: Duane [name] is a 44 y.o. male with medical history significant for chronic diastolic heart failure, hypertension, type 2 diabetes mellitus associated with diabetic gastroparesis and diabetic peripheral polyneuropathy, stage III chronic kidney disease with baseline creatinine 1.5-1.7, who is admitted to [facility] on 04/15/2019 with acute on chronic diastolic heart failure after presenting from home to [facility] Emergency Department complaining of shortness of breath.   The patient reports 2 weeks of progressive shortness of breath associated with development of orthopnea, PND, and worsening of peripheral edema.  He also notes a 20 pound weight gain over the last 2 to 3 weeks.  Denies any associated chest pain, palpitations, diaphoresis, nausea, vomiting. Denies any subjective fever, chills, rigors, or generalized myalgias. Denies any recent headache, neck stiffness, rhinitis, rhinorrhea, sore throat, cough, abdominal pain, diarrhea, or rash. Denies dysuria, gross hematuria, or change in urinary urgency/frequency.  Of note, the patient was hospitalized in the [facility] health system in January 2021 for COVID-19 infection, with positive COVID-19 PCR on 02/24/2019.  Over the course of that hospitalization, the patient developed an acute kidney injury due to associated diarrhea.  He was treated with aggressive IV fluid over the course of the hospitalization, with ensuing renal function trending towards improvement, although creatinine not yet returned to baseline at the time of discharge.  Specifically, relative to baseline creatinine of  1.5-1.7 in the setting of stage II chronic kidney disease, serum creatinine reached a zenith of 4.27 on 03/01/2019.  Subsequent creatinine performed as an outpatient on 04/09/2019 showed interval decline to 2.3.  Given this worsening of the patient's renal function during this previous hospitalization, his prior lisinopril was discontinued, with initiation of labetalol in its place.  At the time of discharge from the hospital, the patient was instructed to resume his home Lasix 60 mg p.o. daily in the context of a history of chronic diastolic heart failure.  However, the patient acknowledges that he did not comply with this recommendation, instead refraining from resumption of his outpatient Lasix due to fear that this would contribute to worsening renal function.  However, in the setting of his presenting worsening shortness of breath, the patient does acknowledge that he resumed his home Lasix approximately 2 to 3 days ago, but has not yet noticed any significant improvement in his breathing subsequent to this reinitiation.  Following discontinuation of lisinopril and initiation of labetalol at the time of his most recent prior hospitalization, the patient reports that his blood pressure has been running high, with systolic values occasionally into the 170s to 180s mmHg. he reports that his baseline systolic blood pressures when he was previously on lisinopril were in the 120s-130's mmHg.   Of note, most recent echocardiogram occurred on 10/27/2018, and showed a normal left ventricular cavity size, moderately increased left ventricular wall thickness, impaired diastolic function, and moderately dilated left atrium.    ED Course:  Vital signs in the ED were notable for the following: Temperature max 98.2; heart rate 85-88; blood pressure 150/78; respiratory rate 20-21; oxygen saturation 93 to 99% on room air.  Labs were notable for the following: BMP notable for sodium 139,  potassium 4.5, bicarbonate 23,  creatinine 2.0 compared to most recent prior creatinine data point of 2.3 on 04/09/2019, glucose 237.  BNP 135, with no prior non-pro-BNP data points available for point comparison.  High-sensitivity troponin I 25.  CBC notable for white blood cell count of 7000, hemoglobin 9.7 compared to most recent prior value of 12.4 on 02/25/2019, with presenting hemoglobin today associated with normocytic and normochromic findings as well as a normal RDW.  Presenting chest x-ray was associated with poor inspiratory effort, thereby limiting the evaluation of such, although, with this limitation in mind, presenting chest x-ray showed no evidence of acute cardiopulmonary process.  EKG showed sinus rhythm with heart rate 83, nonspecific T wave inversion in lead III, no evidence of ST changes, including no evidence of ST elevation.  While in the ED, the following were administered: Lasix 60 mg IV x1.    Review of Systems: As per HPI otherwise 10 point review of systems negative.   Past Medical History:  Diagnosis Date  . Allergy   . Arthritis   . Chronic diarrhea   . Chronic kidney disease   . Depression   . Diabetes mellitus without complication [facility]) age 44   insulin requiring from the start  . Diabetic neuropathy ([place] Dx [address]  . Diabetic proliferative retinopathy ([place]) 08/02/2017  . Gastritis   . Gastroparesis 2012   100% gastric retention on 08/2013 [facility]  . GERD (gastroesophageal reflux disease) Dx 2012  . Hx of adenomatous polyp of colon 10/05/2018  . Hyperlipidemia   . Hypertension Dx 2012  . Neuromuscular disorder ([place])   . Obesity     Past Surgical History:  Procedure Laterality Date  . CHOLECYSTECTOMY  2013  . ESOPHAGOGASTRODUODENOSCOPY N/A 06/11/2013   Procedure: ESOPHAGOGASTRODUODENOSCOPY (EGD);  Surgeon: [name], MD;  Location: [facility];  Service: Endoscopy;  Laterality: N/A;  . EYE SURGERY Left   . FLEXIBLE SIGMOIDOSCOPY N/A 06/11/2013   Procedure: FLEXIBLE SIGMOIDOSCOPY;   Surgeon: [name], MD;  Location: [facility];  Service: Endoscopy;  Laterality: N/A;  . KNEE SURGERY Left     Social History:  reports that he has never smoked. He has never used smokeless tobacco. He reports that he does not drink alcohol or use drugs.   No Known Allergies  Family History  Problem Relation Age of Onset  . Diabetes Father   . Diabetes Maternal Grandmother   . Diabetes Paternal Grandmother   . Colon cancer Cousin   . Asthma Cousin   . Pancreatic cancer Paternal Grandfather   . Lung cancer Paternal Grandfather        mets from pancreas  . Stomach cancer Neg Hx   . Rectal cancer Neg Hx   . Esophageal cancer Neg Hx      Prior to Admission medications   Medication Sig Start Date End Date Taking? Authorizing Provider  albuterol (VENTOLIN HFA) 108 (90 Base) MCG/ACT inhaler Inhale 2 puffs into the lungs every 6 (six) hours as needed for wheezing or shortness of breath. 04/09/19   Spero Geralds, MD  atorvastatin (LIPITOR) 20 MG tablet Take 1 tablet (20 mg total) by mouth daily. 10/21/18   Charlott Rakes, MD  azithromycin (ZITHROMAX) 250 MG tablet Day 1 take 2, then take 1 tablet daily for 4 days 03/25/19   Hendricks Limes, MD  Blood Glucose Monitoring Suppl (TRUE METRIX METER) W/DEVICE KIT 1 each by Does not apply route 4 (four) times daily -  before meals and  at bedtime. 11/05/14   Funches, Adriana Mccallum, MD  dexamethasone (DECADRON) 4 MG tablet Take 1 tablet (4 mg total) by mouth 2 (two) times daily with a meal. 03/25/19   Hendricks Limes, MD  diltiazem (CARDIZEM CD) 180 MG 24 hr capsule TAKE 1 CAPSULE (180 MG TOTAL) BY MOUTH DAILY. 03/31/19   Charlott Rakes, MD  DULoxetine (CYMBALTA) 60 MG capsule Take 1 capsule (60 mg total) by mouth daily. 03/24/19   Charlott Rakes, MD  furosemide (LASIX) 40 MG tablet Take 1.5 tablets (60 mg total) by mouth 2 (two) times daily. Patient taking differently: Take 40 mg by mouth 2 (two) times daily.  06/18/18   Charlott Rakes, MD    gabapentin (NEURONTIN) 300 MG capsule Take 1 capsule (300 mg total) by mouth at bedtime. 10/21/18   Charlott Rakes, MD  glucose blood (CONTOUR NEXT TEST) test strip Use as instructed 10/11/17   Elayne Snare, MD  glucose blood (CONTOUR NEXT TEST) test strip Use as instructed 03/21/18   Kerin Perna, NP  glucose blood (TRUE METRIX BLOOD GLUCOSE TEST) test strip 1 each by Other route 3 (three) times daily. 08/20/16   Funches, Adriana Mccallum, MD  Insulin Glargine (LANTUS SOLOSTAR) 100 UNIT/ML Solostar Pen Inject 40 Units into the skin 2 (two) times daily. 10/21/18   Charlott Rakes, MD  insulin lispro (HUMALOG KWIKPEN) 100 UNIT/ML KwikPen INJECT 15 UNITS INTO THE SKIN 3 TIMES DAILY. 03/31/19   Charlott Rakes, MD  Insulin Pen Needle 31G X 8 MM MISC Inject 1 each into the skin 4 (four) times daily. Check blood sugar TID and QHS 06/15/16   Funches, Josalyn, MD  labetalol (NORMODYNE) 200 MG tablet Take 1 tablet (200 mg total) by mouth 2 (two) times daily. 03/04/19   Debbe Odea, MD  methocarbamol (ROBAXIN) 500 MG tablet Take 1 tablet (500 mg total) by mouth every 8 (eight) hours as needed for muscle spasms. 01/19/19   Charlott Rakes, MD  metoCLOPramide (REGLAN) 10 MG tablet Take a half hour before meals Patient taking differently: Take 10 mg by mouth 3 (three) times daily before meals.  01/29/19   Esterwood, Amy S, PA-C  Pancrelipase, Lip-Prot-Amyl, (ZENPEP) 40000-126000 units CPEP Take 2 capsules by mouth 3 (three) times daily with meals. Take during meal 03/04/19   Debbe Odea, MD  pantoprazole (PROTONIX) 40 MG tablet Take 1 tablet (40 mg total) by mouth daily. 01/29/19   Esterwood, Amy S, PA-C  Peppermint Oil (IBGARD) 90 MG CPCR Take 2 capsules by mouth 3 (three) times daily as needed. 10/11/17   [name], MD  potassium chloride (MICRO-K) 10 MEQ CR capsule Take 1 capsule (10 mEq total) by mouth daily. 04/07/18   Charlott Rakes, MD  pregabalin (LYRICA) 75 MG capsule Take 1 capsule (75 mg total) by mouth 2  (two) times daily. 03/24/19   Charlott Rakes, MD  promethazine (PHENERGAN) 25 MG tablet TAKE 1 TABLET BY MOUTH EVERY 8 HOURS AS NEEDED FOR NAUSEA OR VOMITING. Patient taking differently: Take 25 mg by mouth every 8 (eight) hours as needed for nausea or vomiting.  01/29/19   Esterwood, Amy S, PA-C  sucralfate (CARAFATE) 1 g tablet TAKE 1 TABLET (1 G TOTAL) BY MOUTH 4 (FOUR) TIMES DAILY. 11/06/18   Charlott Rakes, MD  terbinafine (LAMISIL) 250 MG tablet Take 1 tablet (250 mg total) by mouth daily. 11/17/18   Edrick Kins, DPM  TRUEPLUS LANCETS 28G MISC 1 each by Does not apply route 4 (four) times daily -  before meals and at bedtime. 11/04/14   Funches, Josalyn, MD  XIFAXAN 550 MG TABS tablet TAKE 1 TABLET (550 MG TOTAL) BY MOUTH 3 (THREE) TIMES DAILY. Patient taking differently: Take 550 mg by mouth daily as needed (Bacterial infrction in stomach).  11/25/18   Charlott Rakes, MD     Objective    Physical Exam: Vitals:   04/15/19 1740 04/15/19 1742 04/15/19 1830  BP: (!) 194/103  (!) 194/103  Pulse: 85  88  Resp: (!) 21  20  Temp: 98.2 F (36.8 C)    TempSrc: Oral    SpO2: 93%  99%  Weight:  (!) 167.8 kg   Height:  _0  (1.93 m)     General: appears to be stated age; alert, oriented Skin: warm, dry, no rash Head:  AT/[place] Eyes:  PEARL b/l, EOMI Mouth:  Oral mucosa membranes appear moist, normal dentition Neck: supple; trachea midline Heart:  RRR; did not appreciate any M/R/G Lungs: CTAB, did not appreciate any wheezes, rales, or rhonchi Abdomen: + BS; soft, ND, NT Vascular: 2+ pedal pulses b/l; 2+ radial pulses b/l Extremities: 2+ edema bilateral lower extremities, no muscle wasting Neuro: strength and sensation intact in upper and lower extremities b/l   Labs on Admission: I have personally reviewed following labs and imaging studies  CBC: Recent Labs  Lab 04/15/19 1748  WBC 7.0  HGB 9.7*  HCT 31.7*  MCV 87.6  PLT 366   Basic Metabolic Panel: Recent Labs  Lab  04/09/19 1441 04/15/19 1748  NA 139 139  K 4.3 4.5  CL 106 108  CO2 25 23  GLUCOSE 157* 237*  BUN 21 19  CREATININE 2.30* 2.00*  CALCIUM 8.6 8.4*   GFR: Estimated Creatinine Clearance: 81.1 mL/min (A) (by C-G formula based on SCr of 2 mg/dL (H)). Liver Function Tests: No results for input(s): AST, ALT, ALKPHOS, BILITOT, PROT, ALBUMIN in the last 168 hours. No results for input(s): LIPASE, AMYLASE in the last 168 hours. No results for input(s): AMMONIA in the last 168 hours. Coagulation Profile: No results for input(s): INR, PROTIME in the last 168 hours. Cardiac Enzymes: No results for input(s): CKTOTAL, CKMB, CKMBINDEX, TROPONINI in the last 168 hours. BNP (last 3 results) Recent Labs    04/09/19 1511  PROBNP 153.0*   HbA1C: No results for input(s): HGBA1C in the last 72 hours. CBG: No results for input(s): GLUCAP in the last 168 hours. Lipid Profile: No results for input(s): CHOL, HDL, LDLCALC, TRIG, CHOLHDL, LDLDIRECT in the last 72 hours. Thyroid Function Tests: No results for input(s): TSH, T4TOTAL, FREET4, T3FREE, THYROIDAB in the last 72 hours. Anemia Panel: No results for input(s): VITAMINB12, FOLATE, FERRITIN, TIBC, IRON, RETICCTPCT in the last 72 hours. Urine analysis:    Component Value Date/Time   COLORURINE YELLOW 02/24/2019 0542   APPEARANCEUR CLEAR 02/24/2019 0542   LABSPEC 1.021 02/24/2019 0542   PHURINE 5.0 02/24/2019 0542   GLUCOSEU >=500 (A) 02/24/2019 0542   HGBUR MODERATE (A) 02/24/2019 0542   BILIRUBINUR NEGATIVE 02/24/2019 0542   BILIRUBINUR negative 06/01/2016 1059   KETONESUR 5 (A) 02/24/2019 0542   PROTEINUR >=300 (A) 02/24/2019 0542   UROBILINOGEN 0.2 06/01/2016 1059   UROBILINOGEN 0.2 06/27/2014 0748   NITRITE NEGATIVE 02/24/2019 0542   LEUKOCYTESUR NEGATIVE 02/24/2019 0542    Radiological Exams on Admission: DG Chest 2 View  Result Date: 04/15/2019 CLINICAL DATA:  Chest pain and dyspnea.  COVID recovered. EXAM: CHEST - 2 VIEW  COMPARISON:  March 01, 2019. FINDINGS: There is stable mild cardiomegaly. Central pulmonary vascular congestion is present without overt pulmonary edema or obvious pleural effusion. Inspiration is shallow. There is no pneumonia or pneumothorax. Mild skeletal degenerative change and telemetry leads are present. There is bilateral gynecomastia. IMPRESSION: Stable mild cardiomegaly and shallow inspiration. No pneumonia or pulmonary edema. Electronically Signed   By: Revonda Humphrey   On: 04/15/2019 18:12     EKG: Independently reviewed, with result as described above.    Assessment/Plan   Duane [name] is a 44 y.o. male with medical history significant for chronic diastolic heart failure, hypertension, type 2 diabetes mellitus associated with diabetic gastroparesis and diabetic peripheral polyneuropathy, stage III chronic kidney disease with baseline creatinine 1.5-1.7, who is admitted to [facility] on 04/15/2019 with acute on chronic diastolic heart failure after presenting from home to [facility] Emergency Department complaining of shortness of breath.    Principal Problem:   Acute on chronic diastolic heart failure (HCC) Active Problems:   Type I diabetes mellitus with complication, uncontrolled (HCC)   CKD stage 3 secondary to diabetes (HCC)   Shortness of breath   Elevated troponin   #) Acute on chronic diastolic heart failure: Diagnosis on the basis of presenting 2 weeks of progressive shortness of breath associated with orthopnea, PND, new onset peripheral edema, 20 pound weight gain over the last 2 weeks, and elevated BNP.  Suspect contribution from patient's independent discontinuation of his outpatient Lasix over his concern that continue this medication may worsen his renal function, as development of shortness of breath appears to coincide with the timeframe in which the patient discontinued this loop diuretic.  Presenting EKG shows no evidence of acute ischemic  changes, and I feel that ACS is less likely.  The patient reports that he is prescribed Lasix milligrams p.o. daily.  Lasix 60 mg IV x1 was administered in the ED today.  Optimization of home antihypertensive medications would also likely benefit the patient by means of improved afterload reduction, as high systemic blood pressures may have further contributed to diminished cardiac output leading to presenting acute on chronic diastolic heart failure.  Plan: Monitor strict I's and O's and daily weights.  Monitor on telemetry.  Repeat BMP in the morning to evaluate interval trend of electrolytes as well as renal function.  In the meantime I have ordered Lasix 40 mg IV twice daily, with next dose to occur on the morning of 04/16/2019.  Counseled the patient on the importance of outstanding compliance with his outpatient diuretic regimen.  Add on serum magnesium level.  Potassium chloride 10 mEq p.o. daily.       #) Mildly elevated troponin: Presenting high-sensitivity troponin I found to be slightly elevated relative to reference range at 25.  I suspect that this is on the basis of a type II supply demand mismatch in the setting of primary acute on chronic diastolic heart failure as opposed to representing a type I process on the basis of an acute plaque rupture.  Additionally, the patient denies any recent chest discomfort, while presenting EKG shows no evidence of acute ischemic changes.  There may also be a contribution from diminished renal clearance in the setting of diminished, albeit, improving renal function.  Plan: will repeat high-sensitivity troponin I in the morning.  Work-up and management of presenting acute on chronic diastolic heart failure, as above.  Monitor on telemetry.      #) Acute normocytic normochromic anemia: In the context of baseline hemoglobin  range of 11-13, with most recent prior hemoglobin data point of 12.4 on 02/25/1999, presenting CBC reflects hemoglobin of 9.7 with  normocytic and normochromic findings as well as a nonelevated RDW.  No evidence of acute bleed at this time.  Potential hemodelusional component given significant presenting volume overload, as further described above.  However, will expand work-up for acute anemia, as further described below.  Not on any blood thinners at home.   #) Work-up and management of presenting acute on chronic diastolic heart failure, as above.  Check iron studies, reticulocyte count, B12 level methylmalonic acid level.  Check INR.  Repeat CBC in the morning.      #) Prior COVID-19 infection: As it has been greater than 21 days but within 3 months since the patient's positive COVID-19 test on 02/24/2019, there is currently no indication to repeat COVID-19 testing or to maintain Airborne/Contact precautions, and I will be admitting this patient to a non-COVID unit.   Plan: Will refrain from airborne/contact precautions, and admit to non-Covid unit for further work-up and management of suspected acute on chronic diastolic heart failure.       #) Type 2 diabetes mellitus: Outpatient insulin regimen consists of Lantus 40 units subcu twice daily as well as Humalog 15 units subcu 3 times daily with meals.  Presenting blood sugar per presenting BMP noted to be 237.  Patient's diabetes is complicated by diabetic gastroparesis for which she is on Reglan as well as peripheral polyneuropathy, for which she is on Lyrica.  Plan: For now, will initiate basal insulin in the form of Lantus 20 units subcu twice daily, with first dose now.  We will hold home scheduled Humalog for now, and proceed with Accu-Cheks before every meal and at bedtime with associated moderate dose sliding scale insulin.  Continue home Reglan as well as Lyrica.     #) GERD: On Protonix as an outpatient.  Plan: Continue home PPI.      #) Hypertension: Outpatient antihypertensive regimen currently consists of diltiazem, labetalol, and lisinopril, with  labetalol replacing previous lisinopril during most recent prior hospitalization in the setting of acute kidney injury, as further described above.  Per patient, he feels that his blood pressure has been running higher than normal following transition from lisinopril to labetalol.  It is noted that the patient is currently on 2 AV nodal blocking agents in the absence of any known underlying diagnosis of atrial fibrillation.  Would benefit from optimization of his antihypertensive control in order to improve afterload reduction, particularly the setting of known chronic diastolic heart failure.   Plan: We will continue home diltiazem and labetalol for now.  IV diuresis, as above.  Close monitoring of ensuing blood pressures via routine vital signs.    DVT prophylaxis: Lovenox 40 mg subcu daily Code Status: Full code Family Communication: none Disposition Plan: Per Rounding Team Consults called: none  Admission status: Inpatient; med telemetry    PLEASE NOTE THAT DRAGON DICTATION SOFTWARE WAS USED IN THE CONSTRUCTION OF THIS NOTE.   [facility] Pager [phone] From [place].   Otherwise, please contact night-coverage  [email or web address] Password [facility]  04/15/2019, 8:39 PM

## 2019-04-15 NOTE — ED Provider Notes (Signed)
Emergency Department Provider Note   I have reviewed the triage vital signs and the nursing notes.   HISTORY  Chief Complaint Shortness of Breath and Chest Pain   HPI Duane [name] is a 44 y.o. male with PMH of DM, Gastroparesis, HLD, HTN, CKD, and dCHF presents emergency department for evaluation of worsening shortness of breath since his COVID-19 admission in mid January.  Patient has had shortness of breath but denies chest pain to me.  This is mentioned in the triage note but he states he is not having discomfort.  The shortness of breath is worse when he lays down.  He has noticed increased swelling in his legs, abdomen, arms.  He has been taking his Lasix 60 mg twice daily without missing doses.  He saw his primary care doctor and was started on antibiotics along with steroid and has been using an inhaler with no relief.  He last used albuterol immediately prior to coming to the emergency department.  He called back to his PCP and was referred to the ED for evaluation.  In triage he had oxygen saturation 90% and showing signs of tachypnea so was started on 2 L nasal cannula oxygen.    Past Medical History:  Diagnosis Date  . Allergy   . Arthritis   . Chronic diarrhea   . Chronic kidney disease   . Depression   . Diabetes mellitus without complication [facility]) age 40   insulin requiring from the start  . Diabetic neuropathy ([place]) Dx 20110  . Diabetic proliferative retinopathy ([place]) 08/02/2017  . Gastritis   . Gastroparesis 2012   100% gastric retention on 08/2013 [facility]  . GERD (gastroesophageal reflux disease) Dx 2012  . Hx of adenomatous polyp of colon 10/05/2018  . Hyperlipidemia   . Hypertension Dx 2012  . Neuromuscular disorder ([place])   . Obesity     Patient Active Problem List   Diagnosis Date Noted  . Pneumonia due to COVID-19 virus 02/25/2019  . Acute respiratory failure with hypoxia ([place]) 02/25/2019  . Diabetic gastroparesis ([place]) 02/24/2019  . Hx of adenomatous  polyp of colon 10/05/2018  . Upper respiratory infection with cough and congestion 03/21/2018  . Respiratory infection 03/21/2018  . Skin ulcer of right foot, limited to breakdown of skin ([place]) 03/21/2018  . Obesity 01/02/2018  . Gastroparesis 12/21/2017  . CKD stage 3 secondary to diabetes ([place]) 12/21/2017  . Depression 11/20/2017  . Diabetic enteropathy ([place]) likely 10/11/2017  . Diabetic proliferative retinopathy ([place]) 08/02/2017  . Insomnia 05/17/2017  . Pedal edema 10/16/2016  . Chronic kidney disease 10/16/2016  . Vitamin D deficiency 08/21/2016  . Leg swelling 08/21/2016  . Excessive sweating 08/21/2016  . Status post cholecystectomy 10/16/2015  . Onychomycosis of toenail 04/22/2015  . Erectile dysfunction 04/22/2015  . Diabetic gastroparesis associated with type 1 diabetes mellitus ([place]) 01/01/2015  . Skin tag 09/17/2014  . Pain and swelling of left knee 09/13/2014  . Chronic diarrhea 09/13/2014  . Benign paroxysmal positional vertigo 07/15/2014  . MDD (major depressive disorder), single episode, severe , no psychosis ([place]) 05/19/2014  . GERD (gastroesophageal reflux disease) 05/05/2014  . Type I diabetes mellitus with complication, uncontrolled ([place])   . Acute kidney injury ([place]) 10/14/2013  . Diabetic neuropathy ([place]) 08/28/2013  . Abdominal pain 02/09/2013  . DM type 1 (diabetes mellitus, type 1) ([place]) 01/09/2013  . HTN (hypertension) 01/09/2013    Past Surgical History:  Procedure Laterality Date  . CHOLECYSTECTOMY  2013  . ESOPHAGOGASTRODUODENOSCOPY  N/A 06/11/2013   Procedure: ESOPHAGOGASTRODUODENOSCOPY (EGD);  Surgeon: [name], MD;  Location: [facility];  Service: Endoscopy;  Laterality: N/A;  . EYE SURGERY Left   . FLEXIBLE SIGMOIDOSCOPY N/A 06/11/2013   Procedure: FLEXIBLE SIGMOIDOSCOPY;  Surgeon: [name], MD;  Location: [facility];  Service: Endoscopy;  Laterality: N/A;  . KNEE SURGERY Left     Allergies Patient has no known  allergies.  Family History  Problem Relation Age of Onset  . Diabetes Father   . Diabetes Maternal Grandmother   . Diabetes Paternal Grandmother   . Colon cancer Cousin   . Asthma Cousin   . Pancreatic cancer Paternal Grandfather   . Lung cancer Paternal Grandfather        mets from pancreas  . Stomach cancer Neg Hx   . Rectal cancer Neg Hx   . Esophageal cancer Neg Hx     Social History Social History   Tobacco Use  . Smoking status: Never Smoker  . Smokeless tobacco: Never Used  Substance Use Topics  . Alcohol use: No  . Drug use: No    Review of Systems  Constitutional: No fever/chills Eyes: No visual changes. ENT: No sore throat. Cardiovascular: Denies chest pain. Positive fluid in the extremities and abdomen.  Respiratory: Positive shortness of breath. Gastrointestinal: No abdominal pain.  No nausea, no vomiting.  No diarrhea.  No constipation. Genitourinary: Negative for dysuria. Musculoskeletal: Negative for back pain. Skin: Negative for rash. Neurological: Negative for headaches, focal weakness or numbness.  10-point ROS otherwise negative.  ____________________________________________   PHYSICAL EXAM:  VITAL SIGNS: ED Triage Vitals  Enc Vitals Group     BP 04/15/19 1740 (!) 194/103     Pulse Rate 04/15/19 1740 85     Resp 04/15/19 1740 (!) 21     Temp 04/15/19 1740 98.2 F (36.8 C)     Temp Source 04/15/19 1740 Oral     SpO2 04/15/19 1740 93 %     Weight 04/15/19 1742 (!) 370 lb (167.8 kg)     Height 04/15/19 1742 6\' 4"  (1.93 m)   Constitutional: Alert and oriented. Well appearing and in no acute distress. Eyes: Conjunctivae are normal. Head: Atraumatic. Nose: No congestion/rhinnorhea. Mouth/Throat: Mucous membranes are moist.  Neck: No stridor.   Cardiovascular: Normal rate, regular rhythm. Good peripheral circulation. Grossly normal heart sounds.   Respiratory: Increased respiratory effort.  No retractions. Lungs with crackles at the  bases.  No appreciable wheezing.  Gastrointestinal: Soft and nontender. No distention.  Musculoskeletal: Bilateral lower extremity with 4+ pitting edema into the thighs and some pitting edema in the lower abdomen.  Neurologic:  Normal speech and language.  Skin:  Skin is warm, dry and intact. No rash noted.  ____________________________________________   LABS (all labs ordered are listed, but only abnormal results are displayed)  Labs Reviewed  BASIC METABOLIC PANEL - Abnormal; Notable for the following components:      Result Value   Glucose, Bld 237 (*)    Creatinine, Ser 2.00 (*)    Calcium 8.4 (*)    GFR calc non Af Amer 40 (*)    GFR calc Af Amer 46 (*)    All other components within normal limits  CBC - Abnormal; Notable for the following components:   RBC 3.62 (*)    Hemoglobin 9.7 (*)    HCT 31.7 (*)    All other components within normal limits  BRAIN NATRIURETIC PEPTIDE - Abnormal; Notable for the  following components:   B Natriuretic Peptide 135.0 (*)    All other components within normal limits  TROPONIN I (HIGH SENSITIVITY) - Abnormal; Notable for the following components:   Troponin I (High Sensitivity) 25 (*)    All other components within normal limits  TROPONIN I (HIGH SENSITIVITY)   ____________________________________________  EKG   EKG Interpretation  Date/Time:  Wednesday April 15 2019 17:35:59 EST Ventricular Rate:  83 PR Interval:    QRS Duration: 86 QT Interval:  388 QTC Calculation: 456 R Axis:   76 Text Interpretation: Sinus rhythm Borderline repolarization abnormality No STEMI Confirmed by Nanda Quinton [phone]) on 04/15/2019 6:01:45 PM       ____________________________________________  RADIOLOGY  DG Chest 2 View  Result Date: 04/15/2019 CLINICAL DATA:  Chest pain and dyspnea.  COVID recovered. EXAM: CHEST - 2 VIEW COMPARISON:  March 01, 2019. FINDINGS: There is stable mild cardiomegaly. Central pulmonary vascular congestion is present  without overt pulmonary edema or obvious pleural effusion. Inspiration is shallow. There is no pneumonia or pneumothorax. Mild skeletal degenerative change and telemetry leads are present. There is bilateral gynecomastia. IMPRESSION: Stable mild cardiomegaly and shallow inspiration. No pneumonia or pulmonary edema. Electronically Signed   By: Revonda Humphrey   On: 04/15/2019 18:12    ____________________________________________   PROCEDURES  Procedure(s) performed:   Procedures  CRITICAL CARE Performed by: Margette Fast Total critical care time: 35 minutes Critical care time was exclusive of separately billable procedures and treating other patients. Critical care was necessary to treat or prevent imminent or life-threatening deterioration. Critical care was time spent personally by me on the following activities: development of treatment plan with patient and/or surrogate as well as nursing, discussions with consultants, evaluation of patient's response to treatment, examination of patient, obtaining history from patient or surrogate, ordering and performing treatments and interventions, ordering and review of laboratory studies, ordering and review of radiographic studies, pulse oximetry and re-evaluation of patient's condition.  Nanda Quinton, MD Emergency Medicine   ____________________________________________   INITIAL IMPRESSION / ASSESSMENT AND PLAN / ED COURSE  Pertinent labs & imaging results that were available during my care of the patient were reviewed by me and considered in my medical decision making (see chart for details).   Patient presents emergency department for evaluation of worsening shortness of breath over the past several weeks.  He describes some orthopnea and has significant lower extremity edema along with some pitting edema in his abdomen.  Clinically suspect more volume overload type presentation to explain his symptoms.  He is on 2 L at this time which is new.   He is not experiencing chest pain so doubt PE in the post Covid setting. Afebrile here.   Patient's labs are significant for mild elevated BNP at 135.  Creatinine at 2.0 which seems slightly lower than prior values.  Patient is becoming progressively more anemic with normal MCV.  Suspect chronic disease as the cause.  No symptoms to suspect acute bleeding.  Patient does not require blood transfusion at that level.  Was diagnosed with COVID-19 less than 3 months ago so do not plan on Covid testing.  Troponin is slightly elevated at 25.  Suspect this is related to his volume overload.  He is given 60 mg of IV Lasix and will admit for diuresis and Troponin trending.   Discussed patient's case with [facility] to request admission. Patient and family (if present) updated with plan. Care transferred to [facility] service.  I reviewed all  nursing notes, vitals, pertinent old records, EKGs, labs, imaging (as available).  ____________________________________________  FINAL CLINICAL IMPRESSION(S) / ED DIAGNOSES  Final diagnoses:  Shortness of breath  Peripheral edema    MEDICATIONS GIVEN DURING THIS VISIT:  Medications  furosemide (LASIX) injection 60 mg (has no administration in time range)  sodium chloride flush (NS) 0.9 % injection 3 mL (3 mLs Intravenous Given 04/15/19 1837)    Note:  This document was prepared using Dragon voice recognition software and may include unintentional dictation errors.  Nanda Quinton, MD, [facility]    Krissie Merrick, [facility], MD 04/15/19 2051

## 2019-04-15 NOTE — ED Triage Notes (Addendum)
Patient c/o having SOB and chest pain since being diagnosed with Covid on 02/23/19. Patient c/o having SOB and chest pain since then. Worse when he lays down. Patient has seen his PCP and was given antibiotics, steroids, and an inhaler with very little relief.  Sats decrease to 90-92% when talking or moving in triage. Patient placed on O2 2L/min via [place] and sats increased to 95%

## 2019-04-16 DIAGNOSIS — R778 Other specified abnormalities of plasma proteins: Secondary | ICD-10-CM | POA: Diagnosis present

## 2019-04-16 DIAGNOSIS — R0602 Shortness of breath: Secondary | ICD-10-CM | POA: Diagnosis present

## 2019-04-16 LAB — IRON AND TIBC
Iron: 46 ug/dL (ref 45–182)
Saturation Ratios: 16 % — ABNORMAL LOW (ref 17.9–39.5)
TIBC: 284 ug/dL (ref 250–450)
UIBC: 238 ug/dL

## 2019-04-16 LAB — MAGNESIUM: Magnesium: 1.6 mg/dL — ABNORMAL LOW (ref 1.7–2.4)

## 2019-04-16 LAB — COMPREHENSIVE METABOLIC PANEL
ALT: 25 U/L (ref 0–44)
AST: 20 U/L (ref 15–41)
Albumin: 2.7 g/dL — ABNORMAL LOW (ref 3.5–5.0)
Alkaline Phosphatase: 81 U/L (ref 38–126)
Anion gap: 7 (ref 5–15)
BUN: 18 mg/dL (ref 6–20)
CO2: 25 mmol/L (ref 22–32)
Calcium: 8.7 mg/dL — ABNORMAL LOW (ref 8.9–10.3)
Chloride: 107 mmol/L (ref 98–111)
Creatinine, Ser: 1.77 mg/dL — ABNORMAL HIGH (ref 0.61–1.24)
GFR calc Af Amer: 54 mL/min — ABNORMAL LOW (ref >60)
GFR calc non Af Amer: 46 mL/min — ABNORMAL LOW (ref >60)
Glucose, Bld: 195 mg/dL — ABNORMAL HIGH (ref 70–99)
Potassium: 4 mmol/L (ref 3.5–5.1)
Sodium: 139 mmol/L (ref 135–145)
Total Bilirubin: 0.5 mg/dL (ref 0.3–1.2)
Total Protein: 6.1 g/dL — ABNORMAL LOW (ref 6.5–8.1)

## 2019-04-16 LAB — CBC
HCT: 32.9 % — ABNORMAL LOW (ref 39.0–52.0)
Hemoglobin: 9.9 g/dL — ABNORMAL LOW (ref 13.0–17.0)
MCH: 26.1 pg (ref 26.0–34.0)
MCHC: 30.1 g/dL (ref 30.0–36.0)
MCV: 86.8 fL (ref 80.0–100.0)
Platelets: 291 10*3/uL (ref 150–400)
RBC: 3.79 MIL/uL — ABNORMAL LOW (ref 4.22–5.81)
RDW: 13.9 % (ref 11.5–15.5)
WBC: 5.7 10*3/uL (ref 4.0–10.5)
nRBC: 0 % (ref 0.0–0.2)

## 2019-04-16 LAB — FOLATE: Folate: 10.8 ng/mL (ref >5.9)

## 2019-04-16 LAB — RETICULOCYTES
Immature Retic Fract: 18.6 % — ABNORMAL HIGH (ref 2.3–15.9)
RBC.: 3.73 MIL/uL — ABNORMAL LOW (ref 4.22–5.81)
Retic Count, Absolute: 76.8 10*3/uL (ref 19.0–186.0)
Retic Ct Pct: 2.1 % (ref 0.4–3.1)

## 2019-04-16 LAB — GLUCOSE, CAPILLARY
Glucose-Capillary: 140 mg/dL — ABNORMAL HIGH (ref 70–99)
Glucose-Capillary: 155 mg/dL — ABNORMAL HIGH (ref 70–99)
Glucose-Capillary: 158 mg/dL — ABNORMAL HIGH (ref 70–99)
Glucose-Capillary: 163 mg/dL — ABNORMAL HIGH (ref 70–99)
Glucose-Capillary: 185 mg/dL — ABNORMAL HIGH (ref 70–99)

## 2019-04-16 LAB — PROTIME-INR
INR: 1 (ref 0.8–1.2)
Prothrombin Time: 13.4 seconds (ref 11.4–15.2)

## 2019-04-16 LAB — TROPONIN I (HIGH SENSITIVITY): Troponin I (High Sensitivity): 25 ng/L — ABNORMAL HIGH (ref <18)

## 2019-04-16 LAB — FERRITIN: Ferritin: 167 ng/mL (ref 24–336)

## 2019-04-16 MED ORDER — LABETALOL HCL 200 MG PO TABS
200.0000 mg | ORAL_TABLET | Freq: Three times a day (TID) | ORAL | Status: DC
  Administered 2019-04-16 – 2019-04-17 (×5): 200 mg via ORAL
  Filled 2019-04-16 (×5): qty 1

## 2019-04-16 MED ORDER — HYDRALAZINE HCL 50 MG PO TABS
50.0000 mg | ORAL_TABLET | Freq: Three times a day (TID) | ORAL | Status: DC
  Administered 2019-04-16 – 2019-04-17 (×3): 50 mg via ORAL
  Filled 2019-04-16 (×3): qty 1

## 2019-04-16 NOTE — Plan of Care (Signed)
  Problem: Skin Integrity: Goal: Risk for impaired skin integrity will decrease Outcome: Progressing   Problem: Pain Managment: Goal: General experience of comfort will improve Outcome: Progressing   Problem: Elimination: Goal: Will not experience complications related to bowel motility Outcome: Progressing   Problem: Nutrition: Goal: Adequate nutrition will be maintained Outcome: Progressing   Problem: Activity: Goal: Risk for activity intolerance will decrease Outcome: Progressing

## 2019-04-16 NOTE — Progress Notes (Signed)
Rx Brief note: Lovenox  Wt = 167 kg, crcl ~ 81 ml/min, BMI = 45  Rx adjusted Lovenox to 80 mg daily in pt with BMI>30  Thanks Dorrene German 04/16/2019 12:13 AM

## 2019-04-16 NOTE — TOC Progression Note (Signed)
Transition of Care [facility]) - Progression Note    Patient Details  Name: Duane [name] MRN: [id number] Date of Birth: 1975/05/21  Transition of Care [facility]) CM/SW Contact  Purcell Mouton, RN Phone Number: 04/16/2019, 4:01 PM  Clinical Narrative:    TOC will continue to follow for discharge needs.    Expected Discharge Plan: Home/[name] Barriers to Discharge: No Barriers Identified  Expected Discharge Plan and Services Expected Discharge Plan: Home/[name]   Discharge Planning Services: CM Consult   Living arrangements for the past 2 months: Apartment                                       Social Determinants of Health (SDOH) Interventions    Readmission Risk Interventions No flowsheet data found.

## 2019-04-16 NOTE — Progress Notes (Signed)
PROGRESS NOTE  Duane [name]  ZOX:[id number] DOB: 1975/03/01 DOA: 04/15/2019 PCP: Charlott Rakes, MD   Brief Narrative: Duane [name] is a 44 y.o. male with a history of W1XB with complications including gastroparesis, neuropathy, and nephropathy with stage III CKD, as well as HTN, chronic HFpEF, and hospitalization for covid-19 pneumonia in January 2021 who presented to the ED 3/3 with shortness of breath, orthopnea, PND, and peripheral edema associated with gradual >20lbs weight gain in the setting of discontinuation of lasix.   Assessment & Plan: Principal Problem:   Acute on chronic diastolic heart failure (HCC) Active Problems:   Type I diabetes mellitus with complication, uncontrolled (HCC)   CKD stage 3 secondary to diabetes (HCC)   Shortness of breath   Elevated troponin  Acute on chronic HFpEF, HTN: EDW possibly 154kg (at recent DC), currently at 167kg.  - Continue lasix 63m IV BID which subjectively has met diuretic threshold. Recently self-discontinued lasix 68mdaily - Recently substituted labetalol for lisinopril due to creatinine elevation. Home and hospital readings confirm persistent severe range HTN. Will need optimized control to prevent progression of CHF. Increase labetalol to TID. With same TID schedule, start hydralazine at 5071mose.  - Monitor I/O, daily weights, trend BMP.   Stage IIIa CKD:  - Monitor creatinine while diuresing.   IDT2DM: Poorly controlled with last HbA1c 11.2%. A1c has been above 10% for years, consistent with many complications.  - Continue lantus 20u daily, SSI.  - Continue reglan for gastroparesis - Continue lyrica for neuropathy  HLD:  - Continue statin  Troponin elevation: Due to relative myocardial demand mismatch with acute CHF. No chest pain, minimal elevation not consistent with ACS.  - Recommend outpatient risk stratification or more urgent work up if    Anemia of CKD: No bleeding noted.   - Monitor CBC.   Morbid obesity:  BMI falsely elevated due to volume status, though is likely truly >40.   History of covid-19 pneumonia: Resolved. No indication for isolation.   GERD:  - Continue PPI  DVT prophylaxis: Lovenox Code Status: Full Family Communication: None at bedside Disposition Plan: Return home once volume status is improved. Suspected dry weight is ~154kg  Consultants:   None  Procedures:   None  Antimicrobials:  None   Subjective: Remains slightly dyspneic, worse when laying supine, no chest pain. Swelling stable, making a lot of urine.   Objective: Vitals:   04/16/19 1055 04/16/19 1055 04/16/19 1325 04/16/19 1356  BP: (!) 172/94 (!) 172/94 (!) 176/103   Pulse: 81 82 82 80  Resp: 18 18 18    Temp:   98.5 F (36.9 C)   TempSrc:   Oral   SpO2: 95% 95% 94% 96%  Weight:      Height:        Intake/Output Summary (Last 24 hours) at 04/16/2019 1631 Last data filed at 04/16/2019 1355 Gross per 24 hour  Intake 810 ml  Output 1780 ml  Net -970 ml   Filed Weights   04/15/19 1742 04/16/19 0532  Weight: (!) 167.8 kg (!) 167 kg    Gen: [age]o. male in no distress Pulm: Non-labored breathing supplemental oxygen. Clear to auscultation bilaterally.  CV: Regular rate and rhythm. No murmur, rub, or gallop. No definite JVD, 3+ pitting pedal edema. GI: Abdomen soft, non-tender, non-distended, with normoactive bowel sounds. No organomegaly or masses felt. Ext: Warm, no deformities Skin: No rashes, lesions or ulcers Neuro: Alert and oriented. No focal neurological deficits. Psych: Judgement and  insight appear normal. Mood & affect appropriate.   Data Reviewed: I have personally reviewed following labs and imaging studies  CBC: Recent Labs  Lab 04/15/19 1748 04/16/19 0339  WBC 7.0 5.7  HGB 9.7* 9.9*  HCT 31.7* 32.9*  MCV 87.6 86.8  PLT 306 093   Basic Metabolic Panel: Recent Labs  Lab 04/15/19 1748 04/16/19 0339  NA 139 139  K 4.5 4.0  CL 108 107  CO2 23 25  GLUCOSE 237* 195*    BUN 19 18  CREATININE 2.00* 1.77*  CALCIUM 8.4* 8.7*  MG 1.9 1.6*   GFR: Estimated Creatinine Clearance: 91.4 mL/min (A) (by C-G formula based on SCr of 1.77 mg/dL (H)). Liver Function Tests: Recent Labs  Lab 04/16/19 0339  AST 20  ALT 25  ALKPHOS 81  BILITOT 0.5  PROT 6.1*  ALBUMIN 2.7*   No results for input(s): LIPASE, AMYLASE in the last 168 hours. No results for input(s): AMMONIA in the last 168 hours. Coagulation Profile: Recent Labs  Lab 04/16/19 0339  INR 1.0   Cardiac Enzymes: No results for input(s): CKTOTAL, CKMB, CKMBINDEX, TROPONINI in the last 168 hours. BNP (last 3 results) Recent Labs    04/09/19 1511  PROBNP 153.0*   HbA1C: No results for input(s): HGBA1C in the last 72 hours. CBG: Recent Labs  Lab 04/16/19 0009 04/16/19 0800 04/16/19 1135  GLUCAP 185* 140* 155*   Lipid Profile: No results for input(s): CHOL, HDL, LDLCALC, TRIG, CHOLHDL, LDLDIRECT in the last 72 hours. Thyroid Function Tests: No results for input(s): TSH, T4TOTAL, FREET4, T3FREE, THYROIDAB in the last 72 hours. Anemia Panel: Recent Labs    04/16/19 0339  FOLATE 10.8  FERRITIN 167  TIBC 284  IRON 46  RETICCTPCT 2.1   Urine analysis:    Component Value Date/Time   COLORURINE YELLOW 02/24/2019 0542   APPEARANCEUR CLEAR 02/24/2019 0542   LABSPEC 1.021 02/24/2019 0542   PHURINE 5.0 02/24/2019 0542   GLUCOSEU >=500 (A) 02/24/2019 0542   HGBUR MODERATE (A) 02/24/2019 0542   BILIRUBINUR NEGATIVE 02/24/2019 0542   BILIRUBINUR negative 06/01/2016 1059   KETONESUR 5 (A) 02/24/2019 0542   PROTEINUR >=300 (A) 02/24/2019 0542   UROBILINOGEN 0.2 06/01/2016 1059   UROBILINOGEN 0.2 06/27/2014 0748   NITRITE NEGATIVE 02/24/2019 0542   LEUKOCYTESUR NEGATIVE 02/24/2019 0542   No results found for this or any previous visit (from the past 240 hour(s)).    Radiology Studies: DG Chest 2 View  Result Date: 04/15/2019 CLINICAL DATA:  Chest pain and dyspnea.  COVID recovered.  EXAM: CHEST - 2 VIEW COMPARISON:  March 01, 2019. FINDINGS: There is stable mild cardiomegaly. Central pulmonary vascular congestion is present without overt pulmonary edema or obvious pleural effusion. Inspiration is shallow. There is no pneumonia or pneumothorax. Mild skeletal degenerative change and telemetry leads are present. There is bilateral gynecomastia. IMPRESSION: Stable mild cardiomegaly and shallow inspiration. No pneumonia or pulmonary edema. Electronically Signed   By: Revonda Humphrey   On: 04/15/2019 18:12    Scheduled Meds: . atorvastatin  20 mg Oral q1800  . diltiazem  180 mg Oral Daily  . DULoxetine  60 mg Oral Daily  . enoxaparin (LOVENOX) injection  80 mg Subcutaneous Q24H  . furosemide  40 mg Intravenous BID  . insulin aspart  0-15 Units Subcutaneous TID WC  . insulin aspart  0-5 Units Subcutaneous QHS  . insulin glargine  20 Units Subcutaneous BID  . labetalol  200 mg Oral BID  .  metoCLOPramide  10 mg Oral TID AC  . pantoprazole  40 mg Oral Daily  . potassium chloride  10 mEq Oral Daily  . pregabalin  75 mg Oral BID  . sodium chloride flush  3 mL Intravenous Q12H   Continuous Infusions:   LOS: 1 day   Time spent: 35 minutes.  Patrecia Pour, MD [facility] [email or web address] 04/16/2019, 4:31 PM

## 2019-04-17 LAB — CBC
HCT: 35.4 % — ABNORMAL LOW (ref 39.0–52.0)
Hemoglobin: 10.7 g/dL — ABNORMAL LOW (ref 13.0–17.0)
MCH: 26.6 pg (ref 26.0–34.0)
MCHC: 30.2 g/dL (ref 30.0–36.0)
MCV: 87.8 fL (ref 80.0–100.0)
Platelets: 253 10*3/uL (ref 150–400)
RBC: 4.03 MIL/uL — ABNORMAL LOW (ref 4.22–5.81)
RDW: 13.7 % (ref 11.5–15.5)
WBC: 5 10*3/uL (ref 4.0–10.5)
nRBC: 0 % (ref 0.0–0.2)

## 2019-04-17 LAB — BASIC METABOLIC PANEL
Anion gap: 8 (ref 5–15)
BUN: 19 mg/dL (ref 6–20)
CO2: 24 mmol/L (ref 22–32)
Calcium: 8.7 mg/dL — ABNORMAL LOW (ref 8.9–10.3)
Chloride: 106 mmol/L (ref 98–111)
Creatinine, Ser: 1.83 mg/dL — ABNORMAL HIGH (ref 0.61–1.24)
GFR calc Af Amer: 52 mL/min — ABNORMAL LOW (ref >60)
GFR calc non Af Amer: 45 mL/min — ABNORMAL LOW (ref >60)
Glucose, Bld: 163 mg/dL — ABNORMAL HIGH (ref 70–99)
Potassium: 4.6 mmol/L (ref 3.5–5.1)
Sodium: 138 mmol/L (ref 135–145)

## 2019-04-17 LAB — GLUCOSE, CAPILLARY
Glucose-Capillary: 147 mg/dL — ABNORMAL HIGH (ref 70–99)
Glucose-Capillary: 201 mg/dL — ABNORMAL HIGH (ref 70–99)
Glucose-Capillary: 219 mg/dL — ABNORMAL HIGH (ref 70–99)
Glucose-Capillary: 193 mg/dL — ABNORMAL HIGH (ref 70–99)

## 2019-04-17 LAB — MAGNESIUM: Magnesium: 1.6 mg/dL — ABNORMAL LOW (ref 1.7–2.4)

## 2019-04-17 MED ORDER — CLONIDINE HCL 0.1 MG PO TABS
0.1000 mg | ORAL_TABLET | Freq: Three times a day (TID) | ORAL | Status: DC
  Administered 2019-04-17 (×3): 0.1 mg via ORAL
  Filled 2019-04-17 (×3): qty 1

## 2019-04-17 MED ORDER — FUROSEMIDE 10 MG/ML IJ SOLN
80.0000 mg | Freq: Two times a day (BID) | INTRAMUSCULAR | Status: DC
  Administered 2019-04-17 – 2019-04-18 (×2): 80 mg via INTRAVENOUS
  Filled 2019-04-17 (×2): qty 8

## 2019-04-17 MED ORDER — HYDRALAZINE HCL 50 MG PO TABS
100.0000 mg | ORAL_TABLET | Freq: Three times a day (TID) | ORAL | Status: DC
  Administered 2019-04-17 – 2019-04-23 (×18): 100 mg via ORAL
  Filled 2019-04-17 (×18): qty 2

## 2019-04-17 MED ORDER — MAGNESIUM SULFATE 2 GM/50ML IV SOLN
2.0000 g | Freq: Once | INTRAVENOUS | Status: AC
  Administered 2019-04-17: 2 g via INTRAVENOUS
  Filled 2019-04-17: qty 50

## 2019-04-17 NOTE — Progress Notes (Signed)
PROGRESS NOTE  Duane [name]  KDX:[id number] DOB: [birth year] DOA: 04/15/2019 PCP: Charlott Rakes, MD   Brief Narrative: Duane [name] is a 44 y.o. male with a history of Z7QB with complications including gastroparesis, neuropathy, and nephropathy with stage III CKD, as well as HTN, chronic HFpEF, and hospitalization for covid-19 pneumonia in January 2021 who presented to the ED 3/3 with shortness of breath, orthopnea, PND, and peripheral edema associated with gradual >20lbs weight gain in the setting of discontinuation of lasix.   Assessment & Plan: Principal Problem:   Acute on chronic diastolic heart failure (HCC) Active Problems:   Type I diabetes mellitus with complication, uncontrolled (HCC)   CKD stage 3 secondary to diabetes (HCC)   Shortness of breath   Elevated troponin  Acute on chronic HFpEF, HTN: EDW possibly 154kg (at recent DC), currently at 167kg.  - Continue lasix, will increase to 80mg  IV BID. Weight unchanged since admission - Recently substituted labetalol for lisinopril due to creatinine elevation. Home and hospital readings confirm persistent severe range HTN. Will need optimized control to prevent progression of CHF.  - Increased labetalol to TID. With same TID schedule, started hydralazine at 50mg  dose. Remains severely elevated, so will increase to 100mg  dosing. Add clonidine as well.   - Monitor I/O, daily weights, trend BMP, Mg. Cr stable.  Stage IIIa CKD:  - Monitor creatinine daily while diuresing.   IDT2DM: Poorly controlled with last HbA1c 11.2%. A1c has been above 10% for years, consistent with many complications.  - Continue lantus 20u daily, SSI.  - Continue reglan for gastroparesis - Continue lyrica for neuropathy  HLD:  - Continue statin  Troponin elevation: Due to relative myocardial demand mismatch with acute CHF. No chest pain, minimal elevation not consistent with ACS.  - Recommend outpatient risk stratification or more urgent work up if     Anemia of CKD: No bleeding noted.   - Monitor CBC intermittently.  Morbid obesity: BMI falsely elevated due to volume status, though is likely truly >40.   History of covid-19 pneumonia: Resolved. No indication for isolation.   GERD:  - Continue PPI  DVT prophylaxis: Lovenox Code Status: Full Family Communication: None at bedside Disposition Plan: Return home once volume status is improved. Suspected dry weight is ~154kg. Augmenting IV diuresis now.  Consultants:   None  Procedures:   None  Antimicrobials:  None   Subjective: Some fluid off, but weight unchanged, fluid not coming off as fast as it has in the past. Hasn't gotten up to test dyspnea on exertion, but reports only minimal dyspnea at rest.   Objective: Vitals:   04/16/19 1356 04/16/19 1703 04/16/19 2119 04/17/19 0454  BP:  (!) 171/99 (!) 167/95 (!) 176/107  Pulse: 80 81 75   Resp:   18   Temp:   98.3 F (36.8 C) 98.7 F (37.1 C)  TempSrc:   Oral Oral  SpO2: 96%  97% 99%  Weight:    (!) 167.8 kg  Height:        Intake/Output Summary (Last 24 hours) at 04/17/2019 1126 Last data filed at 04/17/2019 1000 Gross per 24 hour  Intake 1545 ml  Output 2645 ml  Net -1100 ml   Filed Weights   04/15/19 1742 04/16/19 0532 04/17/19 0454  Weight: (!) 167.8 kg (!) 167 kg (!) 167.8 kg   Gen: 44 y.o. male in no distress Pulm: Nonlabored breathing supplemental oxygen, diminished at bases. No wheezing. CV: Regular rate and rhythm. No murmur, rub,  or gallop. 3+ pitting dependent edema. GI: Abdomen soft, non-tender, non-distended, with normoactive bowel sounds.  Ext: Warm, no deformities Skin: No new rashes, lesions or ulcers on visualized skin. Neuro: Alert and oriented. No focal neurological deficits. Psych: Judgement and insight appear fair. Mood euthymic & affect congruent. Behavior is appropriate.    Telemetry: Personally reviewed this morning showing NSR.   Data Reviewed: I have personally reviewed following  labs and imaging studies  CBC: Recent Labs  Lab 04/15/19 1748 04/16/19 0339 04/17/19 0453  WBC 7.0 5.7 5.0  HGB 9.7* 9.9* 10.7*  HCT 31.7* 32.9* 35.4*  MCV 87.6 86.8 87.8  PLT 306 291 546   Basic Metabolic Panel: Recent Labs  Lab 04/15/19 1748 04/16/19 0339 04/17/19 0453  NA 139 139 138  K 4.5 4.0 4.6  CL 108 107 106  CO2 23 25 24   GLUCOSE 237* 195* 163*  BUN 19 18 19   CREATININE 2.00* 1.77* 1.83*  CALCIUM 8.4* 8.7* 8.7*  MG 1.9 1.6* 1.6*   GFR: Estimated Creatinine Clearance: 88.7 mL/min (A) (by C-G formula based on SCr of 1.83 mg/dL (H)). Liver Function Tests: Recent Labs  Lab 04/16/19 0339  AST 20  ALT 25  ALKPHOS 81  BILITOT 0.5  PROT 6.1*  ALBUMIN 2.7*   No results for input(s): LIPASE, AMYLASE in the last 168 hours. No results for input(s): AMMONIA in the last 168 hours. Coagulation Profile: Recent Labs  Lab 04/16/19 0339  INR 1.0   Cardiac Enzymes: No results for input(s): CKTOTAL, CKMB, CKMBINDEX, TROPONINI in the last 168 hours. BNP (last 3 results) Recent Labs    04/09/19 1511  PROBNP 153.0*   HbA1C: No results for input(s): HGBA1C in the last 72 hours. CBG: Recent Labs  Lab 04/16/19 0800 04/16/19 1135 04/16/19 1646 04/16/19 2115 04/17/19 0748  GLUCAP 140* 155* 163* 158* 147*   Lipid Profile: No results for input(s): CHOL, HDL, LDLCALC, TRIG, CHOLHDL, LDLDIRECT in the last 72 hours. Thyroid Function Tests: No results for input(s): TSH, T4TOTAL, FREET4, T3FREE, THYROIDAB in the last 72 hours. Anemia Panel: Recent Labs    04/16/19 0339  FOLATE 10.8  FERRITIN 167  TIBC 284  IRON 46  RETICCTPCT 2.1   Urine analysis:    Component Value Date/Time   COLORURINE YELLOW 02/24/2019 0542   APPEARANCEUR CLEAR 02/24/2019 0542   LABSPEC 1.021 02/24/2019 0542   PHURINE 5.0 02/24/2019 0542   GLUCOSEU >=500 (A) 02/24/2019 0542   HGBUR MODERATE (A) 02/24/2019 0542   BILIRUBINUR NEGATIVE 02/24/2019 0542   BILIRUBINUR negative  06/01/2016 1059   KETONESUR 5 (A) 02/24/2019 0542   PROTEINUR >=300 (A) 02/24/2019 0542   UROBILINOGEN 0.2 06/01/2016 1059   UROBILINOGEN 0.2 06/27/2014 0748   NITRITE NEGATIVE 02/24/2019 0542   LEUKOCYTESUR NEGATIVE 02/24/2019 0542   No results found for this or any previous visit (from the past 240 hour(s)).    Radiology Studies: DG Chest 2 View  Result Date: 04/15/2019 CLINICAL DATA:  Chest pain and dyspnea.  COVID recovered. EXAM: CHEST - 2 VIEW COMPARISON:  March 01, 2019. FINDINGS: There is stable mild cardiomegaly. Central pulmonary vascular congestion is present without overt pulmonary edema or obvious pleural effusion. Inspiration is shallow. There is no pneumonia or pneumothorax. Mild skeletal degenerative change and telemetry leads are present. There is bilateral gynecomastia. IMPRESSION: Stable mild cardiomegaly and shallow inspiration. No pneumonia or pulmonary edema. Electronically Signed   By: Revonda Humphrey   On: 04/15/2019 18:12    Scheduled Meds: .  atorvastatin  20 mg Oral q1800  . diltiazem  180 mg Oral Daily  . DULoxetine  60 mg Oral Daily  . enoxaparin (LOVENOX) injection  80 mg Subcutaneous Q24H  . furosemide  80 mg Intravenous BID  . hydrALAZINE  50 mg Oral TID  . insulin aspart  0-15 Units Subcutaneous TID WC  . insulin aspart  0-5 Units Subcutaneous QHS  . insulin glargine  20 Units Subcutaneous BID  . labetalol  200 mg Oral TID  . metoCLOPramide  10 mg Oral TID AC  . pantoprazole  40 mg Oral Daily  . potassium chloride  10 mEq Oral Daily  . pregabalin  75 mg Oral BID  . sodium chloride flush  3 mL Intravenous Q12H   Continuous Infusions:   LOS: 2 days   Time spent: 35 minutes.  Patrecia Pour, MD [facility] [email or web address] 04/17/2019, 11:26 AM

## 2019-04-17 NOTE — Care Management Important Message (Signed)
Important Message  Patient Details IM Letter given to Duane Earing RN Case Manager to present to the Patient Name: Duane [name] MRN: [id number] Date of Birth: Jan 30, 1976   Medicare Important Message Given:  Yes     Kerin Salen 04/17/2019, 10:39 AM

## 2019-04-17 NOTE — Progress Notes (Signed)
Strongly encouraged patient to ambulate in hallway and sit in chair several times throughout the day, patient refused each time.

## 2019-04-17 NOTE — TOC Progression Note (Signed)
Transition of Care [facility]) - Progression Note    Patient Details  Name: Duane [name] MRN: [id number] Date of Birth: 24-Nov-1975  [facility] of [facility]) CM/SW Contact  Purcell Mouton, RN Phone Number: 04/17/2019, 2:18 PM  Clinical Narrative:     Pt states at present time he don't need anything.   Expected Discharge Plan: Home/Self Care Barriers to Discharge: No Barriers Identified  Expected Discharge Plan and Services Expected Discharge Plan: Home/Self Care   Discharge Planning Services: CM Consult   Living arrangements for the past 2 months: Apartment                                       Social Determinants of Health (SDOH) Interventions    Readmission Risk Interventions No flowsheet data found.

## 2019-04-18 ENCOUNTER — Inpatient Hospital Stay (HOSPITAL_COMMUNITY): Payer: Medicare Other

## 2019-04-18 DIAGNOSIS — I10 Essential (primary) hypertension: Secondary | ICD-10-CM

## 2019-04-18 DIAGNOSIS — N183 Chronic kidney disease, stage 3 unspecified: Secondary | ICD-10-CM

## 2019-04-18 DIAGNOSIS — E1065 Type 1 diabetes mellitus with hyperglycemia: Secondary | ICD-10-CM

## 2019-04-18 DIAGNOSIS — R609 Edema, unspecified: Secondary | ICD-10-CM

## 2019-04-18 DIAGNOSIS — I5033 Acute on chronic diastolic (congestive) heart failure: Secondary | ICD-10-CM

## 2019-04-18 DIAGNOSIS — R0602 Shortness of breath: Secondary | ICD-10-CM

## 2019-04-18 DIAGNOSIS — I5023 Acute on chronic systolic (congestive) heart failure: Secondary | ICD-10-CM

## 2019-04-18 DIAGNOSIS — E1122 Type 2 diabetes mellitus with diabetic chronic kidney disease: Secondary | ICD-10-CM

## 2019-04-18 DIAGNOSIS — E108 Type 1 diabetes mellitus with unspecified complications: Secondary | ICD-10-CM

## 2019-04-18 LAB — BASIC METABOLIC PANEL
Anion gap: 6 (ref 5–15)
BUN: 26 mg/dL — ABNORMAL HIGH (ref 6–20)
CO2: 25 mmol/L (ref 22–32)
Calcium: 8.5 mg/dL — ABNORMAL LOW (ref 8.9–10.3)
Chloride: 106 mmol/L (ref 98–111)
Creatinine, Ser: 2.18 mg/dL — ABNORMAL HIGH (ref 0.61–1.24)
GFR calc Af Amer: 42 mL/min — ABNORMAL LOW (ref >60)
GFR calc non Af Amer: 36 mL/min — ABNORMAL LOW (ref >60)
Glucose, Bld: 190 mg/dL — ABNORMAL HIGH (ref 70–99)
Potassium: 4.3 mmol/L (ref 3.5–5.1)
Sodium: 137 mmol/L (ref 135–145)

## 2019-04-18 LAB — URINALYSIS, ROUTINE W REFLEX MICROSCOPIC
Bacteria, UA: NONE SEEN
Bilirubin Urine: NEGATIVE
Glucose, UA: NEGATIVE mg/dL
Hgb urine dipstick: NEGATIVE
Ketones, ur: NEGATIVE mg/dL
Leukocytes,Ua: NEGATIVE
Nitrite: NEGATIVE
Protein, ur: 100 mg/dL — AB
Specific Gravity, Urine: 1.008 (ref 1.005–1.030)
pH: 6 (ref 5.0–8.0)

## 2019-04-18 LAB — ECHOCARDIOGRAM COMPLETE
Height: 76 in
Weight: 5918.91 oz

## 2019-04-18 LAB — GLUCOSE, CAPILLARY
Glucose-Capillary: 142 mg/dL — ABNORMAL HIGH (ref 70–99)
Glucose-Capillary: 194 mg/dL — ABNORMAL HIGH (ref 70–99)
Glucose-Capillary: 153 mg/dL — ABNORMAL HIGH (ref 70–99)
Glucose-Capillary: 95 mg/dL (ref 70–99)

## 2019-04-18 LAB — PROTEIN / CREATININE RATIO, URINE
Creatinine, Urine: 62.39 mg/dL
Protein Creatinine Ratio: 0.87 mg/mg{Cre} — ABNORMAL HIGH (ref 0.00–0.15)
Total Protein, Urine: 54 mg/dL

## 2019-04-18 LAB — SODIUM, URINE, RANDOM: Sodium, Ur: 104 mmol/L

## 2019-04-18 LAB — MAGNESIUM: Magnesium: 1.8 mg/dL (ref 1.7–2.4)

## 2019-04-18 MED ORDER — CARVEDILOL 12.5 MG PO TABS
12.5000 mg | ORAL_TABLET | Freq: Two times a day (BID) | ORAL | Status: DC
  Administered 2019-04-18 – 2019-04-20 (×6): 12.5 mg via ORAL
  Filled 2019-04-18 (×6): qty 1

## 2019-04-18 MED ORDER — FUROSEMIDE 10 MG/ML IJ SOLN
120.0000 mg | Freq: Two times a day (BID) | INTRAVENOUS | Status: DC
  Administered 2019-04-18 – 2019-04-22 (×8): 120 mg via INTRAVENOUS
  Filled 2019-04-18 (×3): qty 12
  Filled 2019-04-18 (×2): qty 10
  Filled 2019-04-18 (×3): qty 12

## 2019-04-18 NOTE — Progress Notes (Signed)
PROGRESS NOTE  Duane [name]  GYF:[id number] DOB: 03-Mar-1975 DOA: 04/15/2019 PCP: Charlott Rakes, MD   Brief Narrative: Duane [name] is a 44 y.o. male with a history of F1MB with complications including gastroparesis, neuropathy, and nephropathy with stage III CKD, as well as HTN, chronic HFpEF, and hospitalization for covid-19 pneumonia in January 2021 who presented to the ED 3/3 with shortness of breath, orthopnea, PND, and peripheral edema associated with gradual >20lbs weight gain in the setting of discontinuation of lasix.   Assessment & Plan: Principal Problem:   Acute on chronic diastolic heart failure (HCC) Active Problems:   Type I diabetes mellitus with complication, uncontrolled ([place])   CKD stage 3 secondary to diabetes (HCC)   Shortness of breath   Elevated troponin  Acute on chronic HFpEF, HTN: EDW possibly 154kg (at recent DC). Weigh not trending downward and I/O showing minimal diuresis over past 24 hours.  - Continue lasix, inclined to further increase dose (vs. add metolazone) based on lackluster diuretic response over last 24 hours, will ask for cardiology recommendations.  - Will need continued titration of antihypertensives. Continue beta blocker, hydralazine. Appreciate cardiology recommendations.  - Echocardiogram ordered.   - Monitor I/O, daily weights, trend BMP, Mg.   Stage IIIa CKD:  - Monitor creatinine daily while diuresing. Began worsening 3/6. Will need follow up with nephrology (sees Dr. Carolin Sicks at [facility]) vs. inpatient consultation. Of note, swelling is not entirely dependent, check urinalysis/sediment, sodium, quantify protein.   IDT2DM: Poorly controlled with last HbA1c 11.2%. A1c has been above 10% for years, consistent with many complications.  - Continue lantus 20u daily, SSI.  - Continue reglan for gastroparesis - Continue lyrica for neuropathy  HLD:  - Continue statin  Troponin elevation: Due to relative myocardial demand mismatch with acute CHF.  No chest pain, minimal elevation not consistent with ACS.  - Recommend outpatient risk stratification or more urgent work up if recommended by cardiology.  Anemia of CKD: No bleeding noted.   - Monitor CBC intermittently.  Morbid obesity: BMI falsely elevated due to volume status, though is likely truly >40.   History of covid-19 pneumonia: Resolved. No indication for isolation.   GERD:  - Continue PPI  DVT prophylaxis: Lovenox Code Status: Full Family Communication: None at bedside Disposition Plan: Return home once volume status is improved. Suspected dry weight is ~154kg. Augmenting IV diuresis further.  Consultants:   None  Procedures:   None  Antimicrobials:  None   Subjective: Did not have brisk diuretic response yesterday that he did initially, swelling is essentially unchanged. Dyspnea on exertion has subjectively improved, allowing him to walk around the unit once, had to stop after that. No chest pain reported.   Objective: Vitals:   04/17/19 2045 04/18/19 0430 04/18/19 0435 04/18/19 1022  BP: 121/77 (!) 147/89  128/75  Pulse: 73 74  81  Resp: 18 18    Temp: 98 F (36.7 C) 98.6 F (37 C)    TempSrc:      SpO2: 98% 97%    Weight:   (!) 167.8 kg   Height:        Intake/Output Summary (Last 24 hours) at 04/18/2019 1157 Last data filed at 04/18/2019 0755 Gross per 24 hour  Intake 480 ml  Output 1700 ml  Net -1220 ml   Filed Weights   04/16/19 0532 04/17/19 0454 04/18/19 0435  Weight: (!) 167 kg (!) 167.8 kg (!) 167.8 kg   Gen: Pleasant male in no distress Pulm: Nonlabored  breathing room air. Clear. CV: Regular rate and rhythm. No murmur, rub, or gallop.  JVD, stable edema which is predominantly dependent, but also noted in UE's. GI: Abdomen soft, non-tender, non-distended, with normoactive bowel sounds.  Ext: Warm, no deformities Skin: No new rashes, lesions or ulcers on visualized skin. Neuro: Alert and oriented. No focal neurological  deficits. Psych: Judgement and insight appear fair. Mood euthymic & affect congruent. Behavior is appropriate.    Telemetry: Personally reviewed today showing NSR.   Data Reviewed: I have personally reviewed following labs and imaging studies  CBC: Recent Labs  Lab 04/15/19 1748 04/16/19 0339 04/17/19 0453  WBC 7.0 5.7 5.0  HGB 9.7* 9.9* 10.7*  HCT 31.7* 32.9* 35.4*  MCV 87.6 86.8 87.8  PLT 306 291 132   Basic Metabolic Panel: Recent Labs  Lab 04/15/19 1748 04/16/19 0339 04/17/19 0453 04/18/19 0359  NA 139 139 138 137  K 4.5 4.0 4.6 4.3  CL 108 107 106 106  CO2 23 25 24 25   GLUCOSE 237* 195* 163* 190*  BUN 19 18 19  26*  CREATININE 2.00* 1.77* 1.83* 2.18*  CALCIUM 8.4* 8.7* 8.7* 8.5*  MG 1.9 1.6* 1.6* 1.8   GFR: Estimated Creatinine Clearance: 74.4 mL/min (A) (by C-G formula based on SCr of 2.18 mg/dL (H)). Liver Function Tests: Recent Labs  Lab 04/16/19 0339  AST 20  ALT 25  ALKPHOS 81  BILITOT 0.5  PROT 6.1*  ALBUMIN 2.7*   No results for input(s): LIPASE, AMYLASE in the last 168 hours. No results for input(s): AMMONIA in the last 168 hours. Coagulation Profile: Recent Labs  Lab 04/16/19 0339  INR 1.0   Cardiac Enzymes: No results for input(s): CKTOTAL, CKMB, CKMBINDEX, TROPONINI in the last 168 hours. BNP (last 3 results) Recent Labs    04/09/19 1511  PROBNP 153.0*   HbA1C: No results for input(s): HGBA1C in the last 72 hours. CBG: Recent Labs  Lab 04/17/19 0748 04/17/19 1206 04/17/19 1650 04/17/19 2047 04/18/19 0759  GLUCAP 147* 201* 219* 193* 142*   Lipid Profile: No results for input(s): CHOL, HDL, LDLCALC, TRIG, CHOLHDL, LDLDIRECT in the last 72 hours. Thyroid Function Tests: No results for input(s): TSH, T4TOTAL, FREET4, T3FREE, THYROIDAB in the last 72 hours. Anemia Panel: Recent Labs    04/16/19 0339  FOLATE 10.8  FERRITIN 167  TIBC 284  IRON 46  RETICCTPCT 2.1   Urine analysis:    Component Value Date/Time    COLORURINE YELLOW 02/24/2019 0542   APPEARANCEUR CLEAR 02/24/2019 0542   LABSPEC 1.021 02/24/2019 0542   PHURINE 5.0 02/24/2019 0542   GLUCOSEU >=500 (A) 02/24/2019 0542   HGBUR MODERATE (A) 02/24/2019 0542   BILIRUBINUR NEGATIVE 02/24/2019 0542   BILIRUBINUR negative 06/01/2016 1059   KETONESUR 5 (A) 02/24/2019 0542   PROTEINUR >=300 (A) 02/24/2019 0542   UROBILINOGEN 0.2 06/01/2016 1059   UROBILINOGEN 0.2 06/27/2014 0748   NITRITE NEGATIVE 02/24/2019 0542   LEUKOCYTESUR NEGATIVE 02/24/2019 0542   No results found for this or any previous visit (from the past 240 hour(s)).    Radiology Studies: No results found.  Scheduled Meds: . atorvastatin  20 mg Oral q1800  . carvedilol  12.5 mg Oral BID WC  . diltiazem  180 mg Oral Daily  . DULoxetine  60 mg Oral Daily  . enoxaparin (LOVENOX) injection  80 mg Subcutaneous Q24H  . hydrALAZINE  100 mg Oral TID  . insulin aspart  0-15 Units Subcutaneous TID WC  . insulin aspart  0-5 Units Subcutaneous QHS  . insulin glargine  20 Units Subcutaneous BID  . metoCLOPramide  10 mg Oral TID AC  . pantoprazole  40 mg Oral Daily  . potassium chloride  10 mEq Oral Daily  . pregabalin  75 mg Oral BID  . sodium chloride flush  3 mL Intravenous Q12H   Continuous Infusions: . furosemide       LOS: 3 days   Time spent: 35 minutes.  Patrecia Pour, MD [facility] [email or web address] 04/18/2019, 11:57 AM

## 2019-04-18 NOTE — Progress Notes (Signed)
  Echocardiogram 2D Echocardiogram has been performed.  Duane [name] 04/18/2019, 3:29 PM

## 2019-04-18 NOTE — Consult Note (Signed)
Cardiology Consultation:   Patient ID: Duane [name] MRN: [id number]; DOB: 1975-11-24  Admit date: 04/15/2019 Date of Consult: 04/18/2019  Primary Care Provider: Charlott Rakes, MD Primary Cardiologist: Buford Dresser, MD New Primary Electrophysiologist:  None    Patient Profile:   Duane [name] is a 44 y.o. male with a hx of type I diabetes complicated by neuropathy and gastroparesis, hypertension, stage 3 chronic kidney disease, recent Covid-19 infection with pneumonia requiring hospitalization, morbid obesity who is being seen today for the evaluation of new onset heart failure at the request of Dr. Bonner Puna.  History of Present Illness:   Duane [name] recent notes and prior hospitalization reviewed, in addition to notes from this hospitalization. He has a PMH of uncontrolled type I diabetes (A1c 11.2) with significant complications, including neuropathy and gastroparesis. He was recently admitted 02/2019 for Covid-19 infection with complication of worsening gastroparesis, acute respiratory failure, and acute kidney injury superimposed on his chronic kidney disease. His baseline Cr had been 1.5-2.0, but during that hospitalization his Cr peaked at 4.27 and was 3.40 at discharge.  He presented to the ER on 04/15/19 with worsening shortness of breath, orthopnea, and LE edema. He reports that he had not been taking the lasix at home as he was worried about it affecting his kidneys. He had to reschedule his nephrology appt but had planned to discuss at that visit. He believes his weight was up 30-40 lbs from his baseline. ER notes comment that he had 4+ pitting edema to his abdomen. He required 2 L O2 by nasal cannula to improve O2 saturations. BNP 135, but this can be falsely low in obesity. Cr was 2.0, downtrending from his prior admission. Hgb was reduced compared to prior, without any report of active bleeding.  During this hospitalization, his IV furosemide dose has been increased to attempt  to augment diuresis. While I/O show he is -3L, his weight has actually increased. Cr has been 2/00 -> 1.77 -> 1.83 -> 2.18 this morning. Last echo done 10/27/18 (prior to Covid hospitalization).  He tells me that he is very worried about his kidneys and heart. He was not weighing himself at home, only at doctor's visits, and was shocked at how much weight he gained. He drinks water, diet soda, and zero calorie drinks at home, unclear volumes. He does ambulate at home though his worsening shortness of breath limited this recently. He had a history of intermittent LE edema in the past, mostly with prolonged sitting, but had not had serious issues with swelling until January of this year.  Denies chest pain, syncope or palpitations.  Heart Pathway Score:     Past Medical History:  Diagnosis Date  . Allergy   . Arthritis   . Chronic diarrhea   . Chronic kidney disease   . Depression   . Diabetes mellitus without complication [facility]) age 36   insulin requiring from the start  . Diabetic neuropathy ([place]) Dx [address]  . Diabetic proliferative retinopathy ([place]) 08/02/2017  . Gastritis   . Gastroparesis 2012   100% gastric retention on 08/2013 [facility]  . GERD (gastroesophageal reflux disease) Dx 2012  . Hx of adenomatous polyp of colon 10/05/2018  . Hyperlipidemia   . Hypertension Dx 2012  . Neuromuscular disorder ([place])   . Obesity     Past Surgical History:  Procedure Laterality Date  . CHOLECYSTECTOMY  2013  . ESOPHAGOGASTRODUODENOSCOPY N/A 06/11/2013   Procedure: ESOPHAGOGASTRODUODENOSCOPY (EGD);  Surgeon: [name], MD;  Location: [facility] ENDOSCOPY;  Service: Endoscopy;  Laterality: N/A;  . EYE SURGERY Left   . FLEXIBLE SIGMOIDOSCOPY N/A 06/11/2013   Procedure: FLEXIBLE SIGMOIDOSCOPY;  Surgeon: [name], MD;  Location: [facility] ENDOSCOPY;  Service: Endoscopy;  Laterality: N/A;  . KNEE SURGERY Left      Home Medications:  Prior to Admission medications   Medication Sig Start Date End Date  Taking? Authorizing Provider  albuterol (VENTOLIN HFA) 108 (90 Base) MCG/ACT inhaler Inhale 2 puffs into the lungs every 6 (six) hours as needed for wheezing or shortness of breath. 04/09/19  Yes Spero Geralds, MD  atorvastatin (LIPITOR) 20 MG tablet Take 1 tablet (20 mg total) by mouth daily. 10/21/18  Yes Newlin, Charlane Ferretti, MD  diltiazem (CARDIZEM CD) 180 MG 24 hr capsule TAKE 1 CAPSULE (180 MG TOTAL) BY MOUTH DAILY. 03/31/19  Yes Charlott Rakes, MD  DULoxetine (CYMBALTA) 60 MG capsule Take 1 capsule (60 mg total) by mouth daily. 03/24/19  Yes Charlott Rakes, MD  furosemide (LASIX) 40 MG tablet Take 1.5 tablets (60 mg total) by mouth 2 (two) times daily. Patient taking differently: Take 40 mg by mouth daily.  06/18/18  Yes Charlott Rakes, MD  Insulin Glargine (LANTUS SOLOSTAR) 100 UNIT/ML Solostar Pen Inject 40 Units into the skin 2 (two) times daily. 10/21/18  Yes Newlin, Enobong, MD  insulin lispro (HUMALOG KWIKPEN) 100 UNIT/ML KwikPen INJECT 15 UNITS INTO THE SKIN 3 TIMES DAILY. Patient taking differently: Inject 15 Units into the skin 3 (three) times daily.  03/31/19  Yes Charlott Rakes, MD  labetalol (NORMODYNE) 200 MG tablet Take 1 tablet (200 mg total) by mouth 2 (two) times daily. 03/04/19  Yes Debbe Odea, MD  methocarbamol (ROBAXIN) 500 MG tablet Take 1 tablet (500 mg total) by mouth every 8 (eight) hours as needed for muscle spasms. 01/19/19  Yes Charlott Rakes, MD  metoCLOPramide (REGLAN) 10 MG tablet Take a half hour before meals Patient taking differently: Take 10 mg by mouth 3 (three) times daily before meals.  01/29/19  Yes Esterwood, Amy S, PA-C  pantoprazole (PROTONIX) 40 MG tablet Take 1 tablet (40 mg total) by mouth daily. 01/29/19  Yes Esterwood, Amy S, PA-C  potassium chloride (MICRO-K) 10 MEQ CR capsule Take 1 capsule (10 mEq total) by mouth daily. 04/07/18  Yes Charlott Rakes, MD  pregabalin (LYRICA) 75 MG capsule Take 1 capsule (75 mg total) by mouth 2 (two) times daily. 03/24/19   Yes Newlin, Charlane Ferretti, MD  promethazine (PHENERGAN) 25 MG tablet TAKE 1 TABLET BY MOUTH EVERY 8 HOURS AS NEEDED FOR NAUSEA OR VOMITING. Patient taking differently: Take 25 mg by mouth every 8 (eight) hours as needed for nausea or vomiting.  01/29/19  Yes Esterwood, Amy S, PA-C  sucralfate (CARAFATE) 1 g tablet TAKE 1 TABLET (1 G TOTAL) BY MOUTH 4 (FOUR) TIMES DAILY. 11/06/18  Yes Charlott Rakes, MD  terbinafine (LAMISIL) 250 MG tablet Take 1 tablet (250 mg total) by mouth daily. 11/17/18  Yes Evans, Brent M, DPM  XIFAXAN 550 MG TABS tablet TAKE 1 TABLET (550 MG TOTAL) BY MOUTH 3 (THREE) TIMES DAILY. Patient taking differently: Take 550 mg by mouth daily as needed (Bacterial infrction in stomach).  11/25/18  Yes Charlott Rakes, MD  azithromycin (ZITHROMAX) 250 MG tablet Day 1 take 2, then take 1 tablet daily for 4 days Patient not taking: Reported on 04/15/2019 03/25/19   Hendricks Limes, MD  Blood Glucose Monitoring Suppl (TRUE METRIX METER) W/DEVICE KIT 1 each by Does not apply route  4 (four) times daily -  before meals and at bedtime. 11/05/14   Funches, Adriana Mccallum, MD  dexamethasone (DECADRON) 4 MG tablet Take 1 tablet (4 mg total) by mouth 2 (two) times daily with a meal. Patient not taking: Reported on 04/15/2019 03/25/19   Hendricks Limes, MD  gabapentin (NEURONTIN) 300 MG capsule Take 1 capsule (300 mg total) by mouth at bedtime. Patient not taking: Reported on 04/15/2019 10/21/18   Charlott Rakes, MD  glucose blood (CONTOUR NEXT TEST) test strip Use as instructed 10/11/17   Elayne Snare, MD  glucose blood (CONTOUR NEXT TEST) test strip Use as instructed 03/21/18   Kerin Perna, NP  glucose blood (TRUE METRIX BLOOD GLUCOSE TEST) test strip 1 each by Other route 3 (three) times daily. 08/20/16   Funches, Adriana Mccallum, MD  Insulin Pen Needle 31G X 8 MM MISC Inject 1 each into the skin 4 (four) times daily. Check blood sugar TID and QHS 06/15/16   Funches, Josalyn, MD  Pancrelipase, Lip-Prot-Amyl, (ZENPEP)  40000-126000 units CPEP Take 2 capsules by mouth 3 (three) times daily with meals. Take during meal Patient not taking: Reported on 04/15/2019 03/04/19   Debbe Odea, MD  Peppermint Oil (IBGARD) 90 MG CPCR Take 2 capsules by mouth 3 (three) times daily as needed. Patient not taking: Reported on 04/15/2019 10/11/17   [name], MD  TRUEPLUS LANCETS 28G MISC 1 each by Does not apply route 4 (four) times daily -  before meals and at bedtime. 11/04/14   Boykin Nearing, MD    Inpatient Medications: Scheduled Meds: . atorvastatin  20 mg Oral q1800  . carvedilol  12.5 mg Oral BID WC  . diltiazem  180 mg Oral Daily  . DULoxetine  60 mg Oral Daily  . enoxaparin (LOVENOX) injection  80 mg Subcutaneous Q24H  . hydrALAZINE  100 mg Oral TID  . insulin aspart  0-15 Units Subcutaneous TID WC  . insulin aspart  0-5 Units Subcutaneous QHS  . insulin glargine  20 Units Subcutaneous BID  . metoCLOPramide  10 mg Oral TID AC  . pantoprazole  40 mg Oral Daily  . potassium chloride  10 mEq Oral Daily  . pregabalin  75 mg Oral BID  . sodium chloride flush  3 mL Intravenous Q12H   Continuous Infusions: . furosemide     PRN Meds: acetaminophen **OR** acetaminophen  Allergies:   No Known Allergies  Social History:   Social History   Socioeconomic History  . Marital status: Single    Spouse name: Not on file  . Number of children: Not on file  . Years of education: Not on file  . Highest education level: Not on file  Occupational History  . 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  difficult to work due to n/v.   Tobacco Use  . Smoking status: Never Smoker  . Smokeless tobacco: Never Used  Substance and Sexual Activity  . Alcohol use: No  . Drug use: No  . Sexual activity: Not on file  Other Topics Concern  . Not on file  Social History Narrative   He is single and a disabled security guard   1 son at least, I do not know if he has other children   No alcohol tobacco or  drug use reported no smoking   Social Determinants of Health   Financial Resource Strain:   . Difficulty of Paying Living Expenses: Not on file  Food Insecurity:   .  Worried About Charity fundraiser in the Last Year: Not on file  . Ran Out of Food in the Last Year: Not on file  Transportation Needs:   . Lack of Transportation (Medical): Not on file  . Lack of Transportation (Non-Medical): Not on file  Physical Activity:   . Days of Exercise per Week: Not on file  . Minutes of Exercise per Session: Not on file  Stress:   . Feeling of Stress : Not on file  Social Connections:   . Frequency of Communication with Friends and Family: Not on file  . Frequency of Social Gatherings with Friends and Family: Not on file  . Attends Religious Services: Not on file  . Active Member of Clubs or Organizations: Not on file  . Attends Archivist Meetings: Not on file  . Marital Status: Not on file  Intimate Partner Violence:   . Fear of Current or Ex-Partner: Not on file  . Emotionally Abused: Not on file  . Physically Abused: Not on file  . Sexually Abused: Not on file    Family History:    Family History  Problem Relation Age of Onset  . Diabetes Father   . Diabetes Maternal Grandmother   . Diabetes Paternal Grandmother   . Colon cancer Cousin   . Asthma Cousin   . Pancreatic cancer Paternal Grandfather   . Lung cancer Paternal Grandfather        mets from pancreas  . Stomach cancer Neg Hx   . Rectal cancer Neg Hx   . Esophageal cancer Neg Hx      ROS:  Please see the history of present illness.  Constitutional: Negative for chills, fever, night sweats, unintentional weight loss  HENT: Negative for ear pain and hearing loss.   Eyes: Negative for loss of vision and eye pain.  Respiratory: Negative for cough, sputum, wheezing.   Cardiovascular: See HPI. Gastrointestinal: Negative for abdominal pain, melena, and hematochezia.  Genitourinary: Negative for dysuria and  hematuria.  Musculoskeletal: Negative for falls and myalgias.  Skin: Negative for itching and rash.  Neurological: Negative for focal weakness, focal sensory changes and loss of consciousness.  Endo/Heme/Allergies: Does not bruise/bleed easily.  All other ROS reviewed and negative.     Physical Exam/Data:   Vitals:   04/17/19 2045 04/18/19 0430 04/18/19 0435 04/18/19 1022  BP: 121/77 (!) 147/89  128/75  Pulse: 73 74  81  Resp: 18 18    Temp: 98 F (36.7 C) 98.6 F (37 C)    TempSrc:      SpO2: 98% 97%    Weight:   (!) 167.8 kg   Height:        Intake/Output Summary (Last 24 hours) at 04/18/2019 1052 Last data filed at 04/18/2019 0755 Gross per 24 hour  Intake 480 ml  Output 1700 ml  Net -1220 ml   Last 3 Weights 04/18/2019 04/17/2019 04/16/2019  Weight (lbs) 369 lb 14.9 oz 369 lb 14.4 oz 368 lb 3.2 oz  Weight (kg) 167.8 kg 167.786 kg 167.014 kg     Body mass index is 45.03 kg/m.  General:  Well nourished, well developed, in no acute distress HEENT: normal Lymph: no adenopathy Neck: JVD low neck at 45 degrees Endocrine:  No thryomegaly Vascular: No carotid bruits; RA pulses 2+ bilaterally Cardiac:  normal S1, S2; RRR; no murmur Lungs:  clear to auscultation bilaterally, no wheezing, rhonchi or rales  Abd: soft, nontender, no hepatomegaly  Ext: diffuse bilateral  LE edema, tense, both pitting and nonpitting components Musculoskeletal:  No deformities, BUE and BLE strength normal and equal Skin: warm and dry  Neuro:  CNs 2-12 intact, no focal abnormalities noted Psych:  Normal affect   EKG:  The EKG was personally reviewed and demonstrates:  NSR with repol changes Telemetry:  Telemetry was personally reviewed and demonstrates:  NSR  Relevant CV Studies: Echo 10/27/18 1. The left ventricle has normal systolic function with an ejection  fraction of 60-65%. The cavity size was normal. There is moderately  increased left ventricular wall thickness. Left ventricular diastolic    Doppler parameters are consistent with impaired  relaxation.  2. Some flow acceleration around basal septum no SAM noted.  3. The right ventricle has normal systolic function. The cavity was  normal. There is no increase in right ventricular wall thickness.  4. Left atrial size was moderately dilated.  5. Mild thickening of the mitral valve leaflet.  6. The aortic valve is tricuspid. Mild thickening of the aortic valve.  Mild calcification of the aortic valve.  7. The aorta is normal unless otherwise noted.   Laboratory Data:  High Sensitivity Troponin:   Recent Labs  Lab 04/15/19 1748 04/16/19 0339  TROPONINIHS 25* 25*     Chemistry Recent Labs  Lab 04/16/19 0339 04/17/19 0453 04/18/19 0359  NA 139 138 137  K 4.0 4.6 4.3  CL 107 106 106  CO2 25 24 25   GLUCOSE 195* 163* 190*  BUN 18 19 26*  CREATININE 1.77* 1.83* 2.18*  CALCIUM 8.7* 8.7* 8.5*  GFRNONAA 46* 45* 36*  GFRAA 54* 52* 42*  ANIONGAP 7 8 6     Recent Labs  Lab 04/16/19 0339  PROT 6.1*  ALBUMIN 2.7*  AST 20  ALT 25  ALKPHOS 81  BILITOT 0.5   Hematology Recent Labs  Lab 04/15/19 1748 04/16/19 0339 04/17/19 0453  WBC 7.0 5.7 5.0  RBC 3.62* 3.79*  3.73* 4.03*  HGB 9.7* 9.9* 10.7*  HCT 31.7* 32.9* 35.4*  MCV 87.6 86.8 87.8  MCH 26.8 26.1 26.6  MCHC 30.6 30.1 30.2  RDW 14.2 13.9 13.7  PLT 306 291 253   BNP Recent Labs  Lab 04/15/19 1812  BNP 135.0*    DDimer No results for input(s): DDIMER in the last 168 hours.   Radiology/Studies:  DG Chest 2 View  Result Date: 04/15/2019 CLINICAL DATA:  Chest pain and dyspnea.  COVID recovered. EXAM: CHEST - 2 VIEW COMPARISON:  March 01, 2019. FINDINGS: There is stable mild cardiomegaly. Central pulmonary vascular congestion is present without overt pulmonary edema or obvious pleural effusion. Inspiration is shallow. There is no pneumonia or pneumothorax. Mild skeletal degenerative change and telemetry leads are present. There is bilateral  gynecomastia. IMPRESSION: Stable mild cardiomegaly and shallow inspiration. No pneumonia or pulmonary edema. Electronically Signed   By: Revonda Humphrey   On: 04/15/2019 18:12   Assessment and Plan:   Acute heart failure, with history of chronic diastolic heart failure: my suspicion is that this is an acute diastolic heart failure exacerbation in the setting of not using outpatient diuretics, but he did have a Covid hospitalization in the interim. Given this and his severe symptoms, it is reasonable to repeat echo to make sure this has not progressed to systolic heart failure -echocardiogram -will increase diuretics to 120 mg IV BID as he reports decreased urine output. May need to consider lasix drip -we discussed heart failure education at length today:  Do the following  things EVERY DAY:  1) Weigh yourself EVERY morning after you go to the bathroom but before you eat or drink anything. Write this number down in a weight log/diary. If you gain 3 pounds overnight or 5 pounds in a week, this suggests you may be retaining fluid. Follow your diuretic plan or call the office for instruction.  2) Take your medicines as prescribed. If you have concerns about your medications, please call us before you stop taking them.   3) Eat low salt foods--Limit salt (sodium) to 2000 mg per day. This will help prevent your body from holding onto fluid. Read food labels as many processed foods have a lot of sodium, especially canned goods and prepackaged meats. If you would like some assistance choosing low sodium foods, we would be happy to set you up with a nutritionist.  4) Stay as active as you can everyday. Staying active will give you more energy and make your muscles stronger. Start with 5 minutes at a time and work your way up to 30 minutes a day. Break up your activities--do some in the morning and some in the afternoon. Start with 3 days per week and work your way up to 5 days as you can.  If you have chest  pain, feel short of breath, dizzy, or lightheaded, STOP. If you don't feel better after a short rest, call 911. If you do feel better, call the office to let us know you have symptoms with exercise.  5) Limit all fluids for the day to less than 2 liters. Fluid includes all drinks, coffee, juice, ice chips, soup, jello, and all other liquids.  Hypertension: has required escalating doses of medications -he was started on clonidine yesterday. I am going to discontinue this, as I think this is going to be difficult for him as an outpatient and there is a significant risk of rebound hypertension. He reports being on it in the past and did not tolerate well -he would benefit from afterload reduction. With his chronic kidney disease, his ACEi was stopped.  -I will stop clonidine, change labetalol to carvedilol, and I would consider amlodipine as next medication (and stop diltiazem) -continue hydralazine 100 mg TID -if EF abnormal, would also add isordil  Type I diabetes, uncontrolled Chronic kidney disease, stage 3b -these complicate his heart failure significantly. We discussed how these are related to his heart. If his creatinine worsens with diuresis, may need to engage nephrology as an inpatient.  For questions or updates, please contact [place] Please consult [email or web address] for contact info under   Signed, Buford Dresser, MD  04/18/2019 10:52 AM

## 2019-04-19 DIAGNOSIS — E8809 Other disorders of plasma-protein metabolism, not elsewhere classified: Secondary | ICD-10-CM

## 2019-04-19 DIAGNOSIS — R801 Persistent proteinuria, unspecified: Secondary | ICD-10-CM

## 2019-04-19 DIAGNOSIS — R778 Other specified abnormalities of plasma proteins: Secondary | ICD-10-CM

## 2019-04-19 LAB — GLUCOSE, CAPILLARY
Glucose-Capillary: 189 mg/dL — ABNORMAL HIGH (ref 70–99)
Glucose-Capillary: 242 mg/dL — ABNORMAL HIGH (ref 70–99)
Glucose-Capillary: 165 mg/dL — ABNORMAL HIGH (ref 70–99)
Glucose-Capillary: 226 mg/dL — ABNORMAL HIGH (ref 70–99)

## 2019-04-19 LAB — BASIC METABOLIC PANEL
Anion gap: 8 (ref 5–15)
BUN: 28 mg/dL — ABNORMAL HIGH (ref 6–20)
CO2: 23 mmol/L (ref 22–32)
Calcium: 8.6 mg/dL — ABNORMAL LOW (ref 8.9–10.3)
Chloride: 106 mmol/L (ref 98–111)
Creatinine, Ser: 2.14 mg/dL — ABNORMAL HIGH (ref 0.61–1.24)
GFR calc Af Amer: 43 mL/min — ABNORMAL LOW (ref >60)
GFR calc non Af Amer: 37 mL/min — ABNORMAL LOW (ref >60)
Glucose, Bld: 157 mg/dL — ABNORMAL HIGH (ref 70–99)
Potassium: 5.1 mmol/L (ref 3.5–5.1)
Sodium: 137 mmol/L (ref 135–145)

## 2019-04-19 LAB — MAGNESIUM: Magnesium: 2 mg/dL (ref 1.7–2.4)

## 2019-04-19 MED ORDER — AMLODIPINE BESYLATE 10 MG PO TABS
10.0000 mg | ORAL_TABLET | Freq: Every day | ORAL | Status: DC
  Administered 2019-04-19 – 2019-04-23 (×5): 10 mg via ORAL
  Filled 2019-04-19 (×5): qty 1

## 2019-04-19 NOTE — Plan of Care (Signed)
  Problem: Education: Goal: Knowledge of General Education information will improve Description: Including pain rating scale, medication(s)/side effects and non-pharmacologic comfort measures Outcome: Progressing   Problem: Health Behavior/Discharge Planning: Goal: Ability to manage health-related needs will improve Outcome: Progressing   Problem: Clinical Measurements: Goal: Ability to maintain clinical measurements within normal limits will improve Outcome: Progressing Goal: Diagnostic test results will improve Outcome: Progressing   Problem: Pain Managment: Goal: General experience of comfort will improve Outcome: Progressing   

## 2019-04-19 NOTE — Progress Notes (Signed)
PROGRESS NOTE  Tabias Swayze  DVV:[id number] DOB: 1975-07-13 DOA: 04/15/2019 PCP: Charlott Rakes, MD   Brief Narrative: Chaden Doom is a 44 y.o. male with a history of G2IR with complications including gastroparesis, neuropathy, and nephropathy with stage III CKD, as well as HTN, chronic HFpEF, and hospitalization for covid-19 pneumonia in January 2021 who presented to the ED 3/3 with shortness of breath, orthopnea, PND, and peripheral edema associated with gradual >20lbs weight gain in the setting of discontinuation of lasix.   Assessment & Plan: Principal Problem:   Acute on chronic diastolic heart failure (HCC) Active Problems:   Type I diabetes mellitus with complication, uncontrolled ([place])   CKD stage 3 secondary to diabetes (HCC)   Shortness of breath   Elevated troponin  Acute on chronic HFpEF, HTN: EDW possibly 154kg (at recent DC). Weight and exam still showing severe overload. Echocardiogram showing preserved EF, indeterminate diastolic function, IVC suggestive of euvolemia? Moderate LVH noted. - Continue lasix at 120mg  IV BID dosing with improved UOP noted 3/6. Cr stable, weight back down a bit, though not tracking consistently with I/O. - Will need continued titration of antihypertensives. Continue beta blocker changed to coreg, norvasc (substituted for diltiazem), hydralazine. Will benefit long term from RAS inhibition. Appreciate cardiology recommendations.  - Echocardiogram ordered.   - Monitor I/O, daily weights, trend BMP, Mg.   Stage IIIa CKD: With proteinuria ~0.9g/day and hypoalbuminemia. Bland sediment otherwise. - Monitor creatinine daily while diuresing. Will need follow up with nephrology (sees Dr. Carolin Sicks at [facility]) vs. inpatient consultation.  - As above, ACE/ARB would be beneficial longterm to limit proteinuria with diabetes.  IDT2DM: Poorly controlled with last HbA1c 11.2%. A1c has been above 10% for years, consistent with many complications.  - Continue lantus  20u daily, SSI.  - Continue reglan for gastroparesis - Continue lyrica for neuropathy  HLD:  - Continue statin  Troponin elevation: Due to relative myocardial demand mismatch with acute CHF. No chest pain, minimal elevation not consistent with ACS.  - Recommend outpatient risk stratification or more urgent work up if recommended by cardiology.  Anemia of CKD: No bleeding noted.   - Monitor CBC intermittently.  Morbid obesity: BMI falsely elevated due to volume status, though is likely truly >40.   History of covid-19 pneumonia: Resolved. No indication for isolation.   GERD:  - Continue PPI  DVT prophylaxis: Lovenox Code Status: Full Family Communication: None at bedside Disposition Plan: Return home once volume status is improved. Suspected dry weight is ~154kg. Augmenting IV diuresis further. Anticipate several more days.  Consultants:   Cardiology  Procedures:   None  Antimicrobials:  None   Subjective: More urine output over past 24 hours, still feels diffusely swollen from baseline. Dyspnea on exertion is mild, improved, but not at baseline.   Objective: Vitals:   04/19/19 0417 04/19/19 0451 04/19/19 1119 04/19/19 1131  BP: (!) 147/82  134/81 134/81  Pulse: 78  80 81  Resp: 20   16  Temp: 98.2 F (36.8 C)   97.8 F (36.6 C)  TempSrc: Oral   Oral  SpO2: 96%   97%  Weight:  (!) 165.5 kg    Height:        Intake/Output Summary (Last 24 hours) at 04/19/2019 1737 Last data filed at 04/19/2019 1343 Gross per 24 hour  Intake 1010 ml  Output 2425 ml  Net -1415 ml   Filed Weights   04/17/19 0454 04/18/19 0435 04/19/19 0451  Weight: (!) 167.8 kg Marland Kitchen)  167.8 kg (!) 165.5 kg   Gen: 44 y.o. male in no distress Pulm: Nonlabored breathing room air, no crackles. CV: Regular rate and rhythm. No murmur, rub, or gallop. No JVD, Pitting diffuse and dependent edema. GI: Abdomen soft, non-tender, non-distended, with normoactive bowel sounds.  Ext: Warm, no deformities  Skin: No rashes, lesions or ulcers on visualized skin. Neuro: Alert and oriented. No focal neurological deficits. Psych: Judgement and insight appear fair. Mood euthymic & affect congruent. Behavior is appropriate.    Data Reviewed: I have personally reviewed following labs and imaging studies  CBC: Recent Labs  Lab 04/15/19 1748 04/16/19 0339 04/17/19 0453  WBC 7.0 5.7 5.0  HGB 9.7* 9.9* 10.7*  HCT 31.7* 32.9* 35.4*  MCV 87.6 86.8 87.8  PLT 306 291 622   Basic Metabolic Panel: Recent Labs  Lab 04/15/19 1748 04/16/19 0339 04/17/19 0453 04/18/19 0359 04/19/19 0416  NA 139 139 138 137 137  K 4.5 4.0 4.6 4.3 5.1  CL 108 107 106 106 106  CO2 23 25 24 25 23   GLUCOSE 237* 195* 163* 190* 157*  BUN 19 18 19  26* 28*  CREATININE 2.00* 1.77* 1.83* 2.18* 2.14*  CALCIUM 8.4* 8.7* 8.7* 8.5* 8.6*  MG 1.9 1.6* 1.6* 1.8 2.0   GFR: Estimated Creatinine Clearance: 75.2 mL/min (A) (by C-G formula based on SCr of 2.14 mg/dL (H)). Liver Function Tests: Recent Labs  Lab 04/16/19 0339  AST 20  ALT 25  ALKPHOS 81  BILITOT 0.5  PROT 6.1*  ALBUMIN 2.7*   No results for input(s): LIPASE, AMYLASE in the last 168 hours. No results for input(s): AMMONIA in the last 168 hours. Coagulation Profile: Recent Labs  Lab 04/16/19 0339  INR 1.0   Cardiac Enzymes: No results for input(s): CKTOTAL, CKMB, CKMBINDEX, TROPONINI in the last 168 hours. BNP (last 3 results) Recent Labs    04/09/19 1511  PROBNP 153.0*   HbA1C: No results for input(s): HGBA1C in the last 72 hours. CBG: Recent Labs  Lab 04/18/19 1629 04/18/19 2046 04/19/19 0749 04/19/19 1128 04/19/19 1701  GLUCAP 153* 95 189* 242* 165*   Lipid Profile: No results for input(s): CHOL, HDL, LDLCALC, TRIG, CHOLHDL, LDLDIRECT in the last 72 hours. Thyroid Function Tests: No results for input(s): TSH, T4TOTAL, FREET4, T3FREE, THYROIDAB in the last 72 hours. Anemia Panel: No results for input(s): VITAMINB12, FOLATE,  FERRITIN, TIBC, IRON, RETICCTPCT in the last 72 hours. Urine analysis:    Component Value Date/Time   COLORURINE STRAW (A) 04/18/2019 1202   APPEARANCEUR CLEAR 04/18/2019 1202   LABSPEC 1.008 04/18/2019 1202   PHURINE 6.0 04/18/2019 1202   GLUCOSEU NEGATIVE 04/18/2019 1202   HGBUR NEGATIVE 04/18/2019 1202   BILIRUBINUR NEGATIVE 04/18/2019 1202   BILIRUBINUR negative 06/01/2016 1059   KETONESUR NEGATIVE 04/18/2019 1202   PROTEINUR 100 (A) 04/18/2019 1202   UROBILINOGEN 0.2 06/01/2016 1059   UROBILINOGEN 0.2 06/27/2014 0748   NITRITE NEGATIVE 04/18/2019 1202   LEUKOCYTESUR NEGATIVE 04/18/2019 1202   No results found for this or any previous visit (from the past 240 hour(s)).    Radiology Studies: ECHOCARDIOGRAM COMPLETE  Result Date: 04/18/2019    ECHOCARDIOGRAM REPORT   Patient Name:   NEILSON OEHLERT Date of Exam: 04/18/2019 Medical Rec #:  [id number]     Height:       76.0 in Accession #:    [id number]    Weight:       369.9 lb Date of Birth:  [birth year]  BSA:          2.877 m Patient Age:    43 years      BP:           132/90 mmHg Patient Gender: M             HR:           77 bpm. Exam Location:  Inpatient Procedure: 2D Echo, Cardiac Doppler and Color Doppler Indications:    I50.23 Acute on chronic systolic (congestive) heart failure  History:        Patient has prior history of Echocardiogram examinations, most                 recent 10/27/2018. Risk Factors:Hypertension, Diabetes and                 Dyslipidemia. Neuromuscular Disorder. CKD. GERD. COVID19.  Sonographer:    Tiffany Dance Referring Phys: [id number] Rachel  1. Left ventricular ejection fraction, by estimation, is 60 to 65%. The left ventricle has normal function. The left ventricle has no regional wall motion abnormalities. There is moderate concentric left ventricular hypertrophy. Left ventricular diastolic parameters are indeterminate.  2. Right ventricular systolic function is normal. The right  ventricular size is normal.  3. Left atrial size was mildly dilated.  4. Right atrial size was mildly dilated.  5. The mitral valve is normal in structure and function. No evidence of mitral valve regurgitation. No evidence of mitral stenosis.  6. The aortic valve is normal in structure and function. Aortic valve regurgitation is not visualized. No aortic stenosis is present.  7. The inferior vena cava is normal in size with greater than 50% respiratory variability, suggesting right atrial pressure of 3 mmHg. FINDINGS  Left Ventricle: Left ventricular ejection fraction, by estimation, is 60 to 65%. The left ventricle has normal function. The left ventricle has no regional wall motion abnormalities. The left ventricular internal cavity size was normal in size. There is  moderate concentric left ventricular hypertrophy. Left ventricular diastolic parameters are indeterminate. Right Ventricle: The right ventricular size is normal. No increase in right ventricular wall thickness. Right ventricular systolic function is normal. Left Atrium: Left atrial size was mildly dilated. Right Atrium: Right atrial size was mildly dilated. Pericardium: A small pericardial effusion is present. The pericardial effusion is surrounding the apex. There is no evidence of cardiac tamponade. Mitral Valve: The mitral valve is normal in structure and function. Normal mobility of the mitral valve leaflets. No evidence of mitral valve regurgitation. No evidence of mitral valve stenosis. Tricuspid Valve: The tricuspid valve is normal in structure. Tricuspid valve regurgitation is not demonstrated. No evidence of tricuspid stenosis. Aortic Valve: The aortic valve is normal in structure and function. Aortic valve regurgitation is not visualized. No aortic stenosis is present. Pulmonic Valve: The pulmonic valve was normal in structure. Pulmonic valve regurgitation is not visualized. No evidence of pulmonic stenosis. Aorta: The aortic root is normal  in size and structure. Venous: The inferior vena cava is normal in size with greater than 50% respiratory variability, suggesting right atrial pressure of 3 mmHg. IAS/Shunts: No atrial level shunt detected by color flow Doppler.  LEFT VENTRICLE PLAX 2D LVIDd:         4.10 cm  Diastology LVIDs:         3.00 cm  LV e' lateral:   8.70 cm/s LV PW:         1.60 cm  LV E/e' lateral:  8.1 LV IVS:        1.50 cm  LV e' medial:    6.20 cm/s LVOT diam:     2.00 cm  LV E/e' medial:  11.4 LV SV:         79 LV SV Index:   27 LVOT Area:     3.14 cm  RIGHT VENTRICLE             IVC RV Basal diam:  2.80 cm     IVC diam: 1.70 cm RV S prime:     12.50 cm/s TAPSE (M-mode): 2.3 cm LEFT ATRIUM             Index       RIGHT ATRIUM           Index LA diam:        4.20 cm 1.46 cm/m  RA Area:     21.20 cm LA Vol (A2C):   79.5 ml 27.63 ml/m RA Volume:   71.70 ml  24.92 ml/m LA Vol (A4C):   66.8 ml 23.22 ml/m LA Biplane Vol: 77.1 ml 26.80 ml/m  AORTIC VALVE LVOT Vmax:   111.50 cm/s LVOT Vmean:  80.200 cm/s LVOT VTI:    0.250 m  AORTA Ao Root diam: 3.40 cm Ao Asc diam:  2.80 cm MITRAL VALVE MV Area (PHT): 2.17 cm    SHUNTS MV Decel Time: 349 msec    Systemic VTI:  0.25 m MV E velocity: 70.90 cm/s  Systemic Diam: 2.00 cm MV A velocity: 73.40 cm/s MV E/A ratio:  0.97 Mihai Croitoru MD Electronically signed by Sanda Klein MD Signature Date/Time: 04/18/2019/4:00:11 PM    Final     Scheduled Meds: . amLODipine  10 mg Oral Daily  . atorvastatin  20 mg Oral q1800  . carvedilol  12.5 mg Oral BID WC  . DULoxetine  60 mg Oral Daily  . enoxaparin (LOVENOX) injection  80 mg Subcutaneous Q24H  . hydrALAZINE  100 mg Oral TID  . insulin aspart  0-15 Units Subcutaneous TID WC  . insulin aspart  0-5 Units Subcutaneous QHS  . insulin glargine  20 Units Subcutaneous BID  . metoCLOPramide  10 mg Oral TID AC  . pantoprazole  40 mg Oral Daily  . potassium chloride  10 mEq Oral Daily  . pregabalin  75 mg Oral BID  . sodium chloride flush  3  mL Intravenous Q12H   Continuous Infusions: . furosemide 120 mg (04/19/19 1152)     LOS: 4 days   Time spent: 35 minutes.  Patrecia Pour, MD [facility] [email or web address] 04/19/2019, 5:37 PM

## 2019-04-19 NOTE — Progress Notes (Signed)
Progress Note  Patient Name: Duane [name] Date of Encounter: 04/19/2019  Primary Cardiologist: Buford Dresser, MD   Subjective   Feeling slightly improved today, though does still feel like he has a lot of fluid on. Was up and walking yesterday. Notes that his O2 sats drop to 92% when he walks without O2 to the bathroom. We reviewed heart failure education and medications again today. No chest pain.  Inpatient Medications    Scheduled Meds: . amLODipine  10 mg Oral Daily  . atorvastatin  20 mg Oral q1800  . carvedilol  12.5 mg Oral BID WC  . DULoxetine  60 mg Oral Daily  . enoxaparin (LOVENOX) injection  80 mg Subcutaneous Q24H  . hydrALAZINE  100 mg Oral TID  . insulin aspart  0-15 Units Subcutaneous TID WC  . insulin aspart  0-5 Units Subcutaneous QHS  . insulin glargine  20 Units Subcutaneous BID  . metoCLOPramide  10 mg Oral TID AC  . pantoprazole  40 mg Oral Daily  . potassium chloride  10 mEq Oral Daily  . pregabalin  75 mg Oral BID  . sodium chloride flush  3 mL Intravenous Q12H   Continuous Infusions: . furosemide 120 mg (04/18/19 2025)   PRN Meds: acetaminophen **OR** acetaminophen   Vital Signs    Vitals:   04/18/19 2048 04/18/19 2350 04/19/19 0417 04/19/19 0451  BP: 131/80 125/82 (!) 147/82   Pulse: 76  78   Resp: 20  20   Temp: 97.8 F (36.6 C)  98.2 F (36.8 C)   TempSrc: Oral  Oral   SpO2: 97%  96%   Weight:    (!) 165.5 kg  Height:        Intake/Output Summary (Last 24 hours) at 04/19/2019 1023 Last data filed at 04/19/2019 0429 Gross per 24 hour  Intake 480 ml  Output 2775 ml  Net -2295 ml   Last 3 Weights 04/19/2019 04/18/2019 04/17/2019  Weight (lbs) 364 lb 13.8 oz 369 lb 14.9 oz 369 lb 14.4 oz  Weight (kg) 165.5 kg 167.8 kg 167.786 kg      Telemetry    SR - Personally Reviewed  ECG    No new since 3/3- Personally Reviewed  Physical Exam   GEN: No acute distress.   Neck: No JVD at 30 degrees Cardiac: RRR, no murmurs, rubs,  or gallops.  Respiratory: Clear to auscultation bilaterally. GI: Soft, nontender, non-distended  MS: Significant bilateral LE edema, both pitting and nonpitting Neuro:  Nonfocal  Psych: Normal affect   Labs    High Sensitivity Troponin:   Recent Labs  Lab 04/15/19 1748 04/16/19 0339  TROPONINIHS 25* 25*      Chemistry Recent Labs  Lab 04/16/19 0339 04/16/19 0339 04/17/19 0453 04/18/19 0359 04/19/19 0416  NA 139   < > 138 137 137  K 4.0   < > 4.6 4.3 5.1  CL 107   < > 106 106 106  CO2 25   < > 24 25 23   GLUCOSE 195*   < > 163* 190* 157*  BUN 18   < > 19 26* 28*  CREATININE 1.77*   < > 1.83* 2.18* 2.14*  CALCIUM 8.7*   < > 8.7* 8.5* 8.6*  PROT 6.1*  --   --   --   --   ALBUMIN 2.7*  --   --   --   --   AST 20  --   --   --   --  ALT 25  --   --   --   --   ALKPHOS 81  --   --   --   --   BILITOT 0.5  --   --   --   --   GFRNONAA 46*   < > 45* 36* 37*  GFRAA 54*   < > 52* 42* 43*  ANIONGAP 7   < > 8 6 8    < > = values in this interval not displayed.     Hematology Recent Labs  Lab 04/15/19 1748 04/16/19 0339 04/17/19 0453  WBC 7.0 5.7 5.0  RBC 3.62* 3.79*  3.73* 4.03*  HGB 9.7* 9.9* 10.7*  HCT 31.7* 32.9* 35.4*  MCV 87.6 86.8 87.8  MCH 26.8 26.1 26.6  MCHC 30.6 30.1 30.2  RDW 14.2 13.9 13.7  PLT 306 291 253    BNP Recent Labs  Lab 04/15/19 1812  BNP 135.0*     DDimer No results for input(s): DDIMER in the last 168 hours.   Radiology    No new  Cardiac Studies   Echo 04/18/19 1. Left ventricular ejection fraction, by estimation, is 60 to 65%. The left ventricle has normal function. The left ventricle has no regional wall motion abnormalities. There is moderate concentric left ventricular hypertrophy. Left ventricular diastolic parameters are indeterminate.  2. Right ventricular systolic function is normal. The right ventricular size is normal.  3. Left atrial size was mildly dilated.  4. Right atrial size was mildly dilated.  5. The  mitral valve is normal in structure and function. No evidence of mitral valve regurgitation. No evidence of mitral stenosis.  6. The aortic valve is normal in structure and function. Aortic valve regurgitation is not visualized. No aortic stenosis is present.  7. The inferior vena cava is normal in size with greater than 50% respiratory variability, suggesting right atrial pressure of 3 mmHg.   Patient Profile     44 y.o. male with a hx of type I diabetes complicated by neuropathy and gastroparesis, hypertension, stage 3 chronic kidney disease, recent Covid-19 infection with pneumonia requiring hospitalization, morbid obesity who is being seen for the evaluation of new onset heart failure at the request of Dr. Bonner Puna.  Assessment & Plan    Acute on chronic diastolic heart failure -echocardiogram with preserved EF. Diastolic function indeterminate. E/e' <15. RAP 3 mmHg -had much improved urine output (>3L) on 120 mg furosemide. Would continue BID at this dose. Need to monitor--per echo yesterday, RA pressure only 3 mmHg, LVEDP not severely elevated. These may not be accurate, but if Cr rises significantly with diuresis, would consider pausing diuresis. He tells me he is still significantly fluid up, and his edema supports this. Likely there is some component of low albumin, as he does have proteinuria and albumin on admission only 2.7. -Cr stable at 2.14 today (2.18 yesterday). K 5.1. -Weight today down to 165.5 kg from 167 kg at admission. Noted net negative 5L, which does not agree with weights.  -we discussed heart failure education at length again today, reconfirmed discussion yesterday:  Do the following things EVERY DAY:  1. Weigh yourself EVERY morning after you go to the bathroom but before you eat or drink anything. Write this number down in a weight log/diary. If you gain 3 pounds overnight or 5 pounds in a week, this suggests you may be retaining fluid. Follow your diuretic plan or  call the office for instruction.  2. Take your medicines as  prescribed. If you have concerns about your medications, please call us before you stop taking them.   3. Eat low salt foods--Limit salt (sodium) to 2000 mg per day. This will help prevent your body from holding onto fluid. Read food labels as many processed foods have a lot of sodium, especially canned goods and prepackaged meats. If you would like some assistance choosing low sodium foods, we would be happy to set you up with a nutritionist.  4. Stay as active as you can everyday. Staying active will give you more energy and make your muscles stronger. Start with 5 minutes at a time and work your way up to 30 minutes a day. Break up your activities--do some in the morning and some in the afternoon. Start with 3 days per week and work your way up to 5 days as you can.  If you have chest pain, feel short of breath, dizzy, or lightheaded, STOP. If you don't feel better after a short rest, call 911. If you do feel better, call the office to let us know you have symptoms with exercise.  5. Limit all fluids for the day to less than 2 liters. Fluid includes all drinks, coffee, juice, ice chips, soup, jello, and all other liquids.  Hypertension: has required escalating doses of medications -discontinued clonidine 3/6 (only received for one day), as I think this is going to be difficult for him as an outpatient and there is a significant risk of rebound hypertension. He reports being on it in the past and did not tolerate well -he would benefit from afterload reduction. With his chronic kidney disease, his ACEi was stopped. On hydralazine as below, changing diltiazem to amlodipine today -changed labetalol to carvedilol 3/6, tolerating change well, BP improving -continue hydralazine 100 mg TID. This will be difficult as an outpatient, but we have limited options -on follow up, if nephrology feels he is a candidate, would consider ACEi/ARB with  careful monitoring given his diabetes and proteinuria, but with recent AKI will not start this inpatient -diuretic as above. No MRA given his CKD, recent AKI history  Type I diabetes, uncontrolled Chronic kidney disease, stage 3b -these complicate his heart failure significantly. We discussed how these are related to his heart. If his creatinine worsens with diuresis, may need to engage nephrology as an inpatient.  For questions or updates, please contact [place] Please consult [email or web address] for contact info under        Signed, Buford Dresser, MD  04/19/2019, 10:23 AM

## 2019-04-20 LAB — BASIC METABOLIC PANEL
Anion gap: 6 (ref 5–15)
BUN: 29 mg/dL — ABNORMAL HIGH (ref 6–20)
CO2: 27 mmol/L (ref 22–32)
Calcium: 8.8 mg/dL — ABNORMAL LOW (ref 8.9–10.3)
Chloride: 106 mmol/L (ref 98–111)
Creatinine, Ser: 2.05 mg/dL — ABNORMAL HIGH (ref 0.61–1.24)
GFR calc Af Amer: 45 mL/min — ABNORMAL LOW (ref >60)
GFR calc non Af Amer: 39 mL/min — ABNORMAL LOW (ref >60)
Glucose, Bld: 126 mg/dL — ABNORMAL HIGH (ref 70–99)
Potassium: 4.4 mmol/L (ref 3.5–5.1)
Sodium: 139 mmol/L (ref 135–145)

## 2019-04-20 LAB — GLUCOSE, CAPILLARY
Glucose-Capillary: 120 mg/dL — ABNORMAL HIGH (ref 70–99)
Glucose-Capillary: 184 mg/dL — ABNORMAL HIGH (ref 70–99)
Glucose-Capillary: 200 mg/dL — ABNORMAL HIGH (ref 70–99)
Glucose-Capillary: 139 mg/dL — ABNORMAL HIGH (ref 70–99)

## 2019-04-20 LAB — MAGNESIUM: Magnesium: 1.9 mg/dL (ref 1.7–2.4)

## 2019-04-20 MED ORDER — INSULIN ASPART 100 UNIT/ML ~~LOC~~ SOLN
3.0000 [IU] | Freq: Three times a day (TID) | SUBCUTANEOUS | Status: DC
  Administered 2019-04-20 – 2019-04-23 (×11): 3 [IU] via SUBCUTANEOUS

## 2019-04-20 NOTE — Care Management Important Message (Signed)
Important Message  Patient Details IM Letter given to Gabriel Earing RN Case Manager to present to the Patient Name: Duane [name] MRN: [id number] Date of Birth: [birth year]   Medicare Important Message Given:  Yes     Kerin Salen 04/20/2019, 10:57 AM

## 2019-04-20 NOTE — Progress Notes (Signed)
Patient ambulated 360 twice today. Activity tolerated well. Pt also sat up in the chair for three hours. Will continue to monitor and encourage.

## 2019-04-20 NOTE — Progress Notes (Signed)
PROGRESS NOTE  Duane [name]  VVO:[id number] DOB: 1975/08/19 DOA: 04/15/2019 PCP: Charlott Rakes, MD   Brief Narrative: Duane [name] is a 44 y.o. male with a history of Y6RS with complications including gastroparesis, neuropathy, and nephropathy with stage III CKD, as well as HTN, chronic HFpEF, and hospitalization for covid-19 pneumonia in January 2021 who presented to the ED 3/3 with shortness of breath, orthopnea, PND, and peripheral edema associated with gradual >20lbs weight gain in the setting of discontinuation of lasix.   Assessment & Plan: Principal Problem:   Acute on chronic diastolic heart failure (HCC) Active Problems:   Type I diabetes mellitus with complication, uncontrolled ([place])   CKD stage 3 secondary to diabetes (HCC)   Shortness of breath   Elevated troponin  Acute on chronic HFpEF, HTN: EDW possibly 154kg (at recent DC). Weight and exam still showing severe overload. Echocardiogram showing preserved EF, indeterminate diastolic function, IVC suggestive of euvolemia? Moderate LVH noted. - Continue lasix at 120mg  IV BID dosing, continue I/O (~3.6L UOP, weight down 8 lbs over past 24 hours) - BPs much improved. Continue beta blocker changed to coreg, norvasc (substituted for diltiazem), hydralazine. Will benefit long term from RAS inhibition. Appreciate cardiology recommendations.   - Monitor I/O, daily weights, trend BMP, Mg.   Stage IIIa CKD: With proteinuria ~0.9g/day and hypoalbuminemia. Bland sediment otherwise. - Monitor creatinine daily while diuresing. Will need follow up with nephrology (sees Dr. Carolin Sicks at [facility]). Creatinine improved with diuresis, so no need for nephrology input at this time. - As above, ACE/ARB would be beneficial longterm to limit proteinuria with diabetes.  IDT2DM: Poorly controlled with last HbA1c 11.2%. A1c has been above 10% for years, consistent with many complications.  - Continue lantus 20u daily, SSI. Added 3u novolog TIDWC, fasting is  at goal.  - Continue reglan for gastroparesis - Continue lyrica for neuropathy  HLD:  - Continue statin  Troponin elevation: Due to relative myocardial demand mismatch with acute CHF. No chest pain, minimal elevation not consistent with ACS.  - Recommend outpatient risk stratification or more urgent work up if recommended by cardiology.  Anemia of CKD: No bleeding noted.   - Monitor CBC intermittently.  Morbid obesity: BMI falsely elevated due to volume status, though is likely truly >40.   History of covid-19 pneumonia: Resolved. No indication for isolation.   GERD:  - Continue PPI  DVT prophylaxis: Lovenox Code Status: Full Family Communication: None at bedside Disposition Plan: Return home once volume status is improved. Suspected dry weight is ~154kg. Depending on clinical trend, may be eligible for DC 3/10-3/11.  Consultants:   Cardiology  Procedures:   None  Antimicrobials:  None   Subjective: Brisk diuresis, urinating significantly. Less easily worn out when ambulating.   Objective: Vitals:   04/19/19 2013 04/20/19 0501 04/20/19 0505 04/20/19 0824  BP: 140/76 (!) 144/81  (!) 159/89  Pulse: 79 84  83  Resp: 20 20  18   Temp: 97.8 F (36.6 C) 98.4 F (36.9 C)    TempSrc: Oral Oral    SpO2: 98% 96%  99%  Weight:   (!) 161.6 kg   Height:        Intake/Output Summary (Last 24 hours) at 04/20/2019 1217 Last data filed at 04/20/2019 0102 Gross per 24 hour  Intake 480 ml  Output 3100 ml  Net -2620 ml   Filed Weights   04/18/19 0435 04/19/19 0451 04/20/19 0505  Weight: (!) 167.8 kg (!) 165.5 kg (!) 161.6 kg  Gen: 44 y.o. male in no distress Pulm: Nonlabored breathing supplemental oxygen, no crackles or wheezes CV: Regular rate and rhythm. No murmur, rub, or gallop. No definite JVD, Stable nonpitting edema diffusely with improved pitting dependent component GI: Abdomen soft, non-tender, non-distended, with normoactive bowel sounds.  Ext: Warm, no  deformities Skin: No rashes, lesions or ulcers on visualized skin. Neuro: Alert and oriented. No focal neurological deficits. Psych: Judgement and insight appear fair. Mood euthymic & affect congruent. Behavior is appropriate.    Data Reviewed: I have personally reviewed following labs and imaging studies  CBC: Recent Labs  Lab 04/15/19 1748 04/16/19 0339 04/17/19 0453  WBC 7.0 5.7 5.0  HGB 9.7* 9.9* 10.7*  HCT 31.7* 32.9* 35.4*  MCV 87.6 86.8 87.8  PLT 306 291 703   Basic Metabolic Panel: Recent Labs  Lab 04/16/19 0339 04/17/19 0453 04/18/19 0359 04/19/19 0416 04/20/19 0429  NA 139 138 137 137 139  K 4.0 4.6 4.3 5.1 4.4  CL 107 106 106 106 106  CO2 25 24 25 23 27   GLUCOSE 195* 163* 190* 157* 126*  BUN 18 19 26* 28* 29*  CREATININE 1.77* 1.83* 2.18* 2.14* 2.05*  CALCIUM 8.7* 8.7* 8.5* 8.6* 8.8*  MG 1.6* 1.6* 1.8 2.0 1.9   GFR: Estimated Creatinine Clearance: 77.5 mL/min (A) (by C-G formula based on SCr of 2.05 mg/dL (H)). Liver Function Tests: Recent Labs  Lab 04/16/19 0339  AST 20  ALT 25  ALKPHOS 81  BILITOT 0.5  PROT 6.1*  ALBUMIN 2.7*   No results for input(s): LIPASE, AMYLASE in the last 168 hours. No results for input(s): AMMONIA in the last 168 hours. Coagulation Profile: Recent Labs  Lab 04/16/19 0339  INR 1.0   Cardiac Enzymes: No results for input(s): CKTOTAL, CKMB, CKMBINDEX, TROPONINI in the last 168 hours. BNP (last 3 results) Recent Labs    04/09/19 1511  PROBNP 153.0*   HbA1C: No results for input(s): HGBA1C in the last 72 hours. CBG: Recent Labs  Lab 04/19/19 0749 04/19/19 1128 04/19/19 1701 04/19/19 2010 04/20/19 0744  GLUCAP 189* 242* 165* 226* 120*   Lipid Profile: No results for input(s): CHOL, HDL, LDLCALC, TRIG, CHOLHDL, LDLDIRECT in the last 72 hours. Thyroid Function Tests: No results for input(s): TSH, T4TOTAL, FREET4, T3FREE, THYROIDAB in the last 72 hours. Anemia Panel: No results for input(s): VITAMINB12,  FOLATE, FERRITIN, TIBC, IRON, RETICCTPCT in the last 72 hours. Urine analysis:    Component Value Date/Time   COLORURINE STRAW (A) 04/18/2019 1202   APPEARANCEUR CLEAR 04/18/2019 1202   LABSPEC 1.008 04/18/2019 1202   PHURINE 6.0 04/18/2019 1202   GLUCOSEU NEGATIVE 04/18/2019 1202   HGBUR NEGATIVE 04/18/2019 1202   BILIRUBINUR NEGATIVE 04/18/2019 1202   BILIRUBINUR negative 06/01/2016 1059   KETONESUR NEGATIVE 04/18/2019 1202   PROTEINUR 100 (A) 04/18/2019 1202   UROBILINOGEN 0.2 06/01/2016 1059   UROBILINOGEN 0.2 06/27/2014 0748   NITRITE NEGATIVE 04/18/2019 1202   LEUKOCYTESUR NEGATIVE 04/18/2019 1202   No results found for this or any previous visit (from the past 240 hour(s)).    Radiology Studies: ECHOCARDIOGRAM COMPLETE  Result Date: 04/18/2019    ECHOCARDIOGRAM REPORT   Patient Name:   Duane [name] Date of Exam: 04/18/2019 Medical Rec #:  [id number]     Height:       76.0 in Accession #:    [id number]    Weight:       369.9 lb Date of Birth:  04/13/1975  BSA:          2.877 m Patient Age:    43 years      BP:           132/90 mmHg Patient Gender: M             HR:           77 bpm. Exam Location:  Inpatient Procedure: 2D Echo, Cardiac Doppler and Color Doppler Indications:    I50.23 Acute on chronic systolic (congestive) heart failure  History:        Patient has prior history of Echocardiogram examinations, most                 recent 10/27/2018. Risk Factors:Hypertension, Diabetes and                 Dyslipidemia. Neuromuscular Disorder. CKD. GERD. COVID19.  Sonographer:    Tiffany Dance Referring Phys: [id number] [place]  1. Left ventricular ejection fraction, by estimation, is 60 to 65%. The left ventricle has normal function. The left ventricle has no regional wall motion abnormalities. There is moderate concentric left ventricular hypertrophy. Left ventricular diastolic parameters are indeterminate.  2. Right ventricular systolic function is normal. The  right ventricular size is normal.  3. Left atrial size was mildly dilated.  4. Right atrial size was mildly dilated.  5. The mitral valve is normal in structure and function. No evidence of mitral valve regurgitation. No evidence of mitral stenosis.  6. The aortic valve is normal in structure and function. Aortic valve regurgitation is not visualized. No aortic stenosis is present.  7. The inferior vena cava is normal in size with greater than 50% respiratory variability, suggesting right atrial pressure of 3 mmHg. FINDINGS  Left Ventricle: Left ventricular ejection fraction, by estimation, is 60 to 65%. The left ventricle has normal function. The left ventricle has no regional wall motion abnormalities. The left ventricular internal cavity size was normal in size. There is  moderate concentric left ventricular hypertrophy. Left ventricular diastolic parameters are indeterminate. Right Ventricle: The right ventricular size is normal. No increase in right ventricular wall thickness. Right ventricular systolic function is normal. Left Atrium: Left atrial size was mildly dilated. Right Atrium: Right atrial size was mildly dilated. Pericardium: A small pericardial effusion is present. The pericardial effusion is surrounding the apex. There is no evidence of cardiac tamponade. Mitral Valve: The mitral valve is normal in structure and function. Normal mobility of the mitral valve leaflets. No evidence of mitral valve regurgitation. No evidence of mitral valve stenosis. Tricuspid Valve: The tricuspid valve is normal in structure. Tricuspid valve regurgitation is not demonstrated. No evidence of tricuspid stenosis. Aortic Valve: The aortic valve is normal in structure and function. Aortic valve regurgitation is not visualized. No aortic stenosis is present. Pulmonic Valve: The pulmonic valve was normal in structure. Pulmonic valve regurgitation is not visualized. No evidence of pulmonic stenosis. Aorta: The aortic root is  normal in size and structure. Venous: The inferior vena cava is normal in size with greater than 50% respiratory variability, suggesting right atrial pressure of 3 mmHg. IAS/Shunts: No atrial level shunt detected by color flow Doppler.  LEFT VENTRICLE PLAX 2D LVIDd:         4.10 cm  Diastology LVIDs:         3.00 cm  LV e' lateral:   8.70 cm/s LV PW:         1.60 cm  LV E/e' lateral:  8.1 LV IVS:        1.50 cm  LV e' medial:    6.20 cm/s LVOT diam:     2.00 cm  LV E/e' medial:  11.4 LV SV:         79 LV SV Index:   27 LVOT Area:     3.14 cm  RIGHT VENTRICLE             IVC RV Basal diam:  2.80 cm     IVC diam: 1.70 cm RV S prime:     12.50 cm/s TAPSE (M-mode): 2.3 cm LEFT ATRIUM             Index       RIGHT ATRIUM           Index LA diam:        4.20 cm 1.46 cm/m  RA Area:     21.20 cm LA Vol (A2C):   79.5 ml 27.63 ml/m RA Volume:   71.70 ml  24.92 ml/m LA Vol (A4C):   66.8 ml 23.22 ml/m LA Biplane Vol: 77.1 ml 26.80 ml/m  AORTIC VALVE LVOT Vmax:   111.50 cm/s LVOT Vmean:  80.200 cm/s LVOT VTI:    0.250 m  AORTA Ao Root diam: 3.40 cm Ao Asc diam:  2.80 cm MITRAL VALVE MV Area (PHT): 2.17 cm    SHUNTS MV Decel Time: 349 msec    Systemic VTI:  0.25 m MV E velocity: 70.90 cm/s  Systemic Diam: 2.00 cm MV A velocity: 73.40 cm/s MV E/A ratio:  0.97 Mihai Croitoru MD Electronically signed by Sanda Klein MD Signature Date/Time: 04/18/2019/4:00:11 PM    Final     Scheduled Meds: . amLODipine  10 mg Oral Daily  . atorvastatin  20 mg Oral q1800  . carvedilol  12.5 mg Oral BID WC  . DULoxetine  60 mg Oral Daily  . enoxaparin (LOVENOX) injection  80 mg Subcutaneous Q24H  . hydrALAZINE  100 mg Oral TID  . insulin aspart  0-15 Units Subcutaneous TID WC  . insulin aspart  0-5 Units Subcutaneous QHS  . insulin aspart  3 Units Subcutaneous TID WC  . insulin glargine  20 Units Subcutaneous BID  . metoCLOPramide  10 mg Oral TID AC  . pantoprazole  40 mg Oral Daily  . pregabalin  75 mg Oral BID  . sodium  chloride flush  3 mL Intravenous Q12H   Continuous Infusions: . furosemide 120 mg (04/20/19 0832)     LOS: 5 days   Time spent: 25 minutes.  Patrecia Pour, MD [facility] [email or web address] 04/20/2019, 12:17 PM

## 2019-04-20 NOTE — Progress Notes (Signed)
Progress Note  Patient Name: Duane [name] Date of Encounter: 04/20/2019  Primary Cardiologist: Buford Dresser, MD   Subjective   No complaints this am   Inpatient Medications    Scheduled Meds: . amLODipine  10 mg Oral Daily  . atorvastatin  20 mg Oral q1800  . carvedilol  12.5 mg Oral BID WC  . DULoxetine  60 mg Oral Daily  . enoxaparin (LOVENOX) injection  80 mg Subcutaneous Q24H  . hydrALAZINE  100 mg Oral TID  . insulin aspart  0-15 Units Subcutaneous TID WC  . insulin aspart  0-5 Units Subcutaneous QHS  . insulin aspart  3 Units Subcutaneous TID WC  . insulin glargine  20 Units Subcutaneous BID  . metoCLOPramide  10 mg Oral TID AC  . pantoprazole  40 mg Oral Daily  . pregabalin  75 mg Oral BID  . sodium chloride flush  3 mL Intravenous Q12H   Continuous Infusions: . furosemide 120 mg (04/20/19 0832)   PRN Meds: acetaminophen **OR** acetaminophen   Vital Signs    Vitals:   04/19/19 2013 04/20/19 0501 04/20/19 0505 04/20/19 0824  BP: 140/76 (!) 144/81  (!) 159/89  Pulse: 79 84  83  Resp: 20 20  18   Temp: 97.8 F (36.6 C) 98.4 F (36.9 C)    TempSrc: Oral Oral    SpO2: 98% 96%  99%  Weight:   (!) 161.6 kg   Height:        Intake/Output Summary (Last 24 hours) at 04/20/2019 0858 Last data filed at 04/20/2019 0102 Gross per 24 hour  Intake 770 ml  Output 3600 ml  Net -2830 ml   Last 3 Weights 04/20/2019 04/19/2019 04/18/2019  Weight (lbs) 356 lb 4.2 oz 364 lb 13.8 oz 369 lb 14.9 oz  Weight (kg) 161.6 kg 165.5 kg 167.8 kg      Telemetry    SR - Personally Reviewed 04/20/2019   ECG    SR Rate 83 normal 04/17/19   Physical Exam   BP (!) 159/89 (BP Location: Right Arm)   Pulse 83   Temp 98.4 F (36.9 C) (Oral)   Resp 18   Ht 6\' 4"  (1.93 m)   Wt (!) 161.6 kg   SpO2 99%   BMI 43.37 kg/m   Affect appropriate Morbidly obese black male  HEENT: normal Neck supple with no adenopathy JVP normal no bruits no thyromegaly Lungs clear with no  wheezing and good diaphragmatic motion Heart:  S1/S2 no murmur, no rub, gallop or click PMI normal Abdomen: benighn, BS positve, no tenderness, no AAA no bruit.  No HSM or HJR Distal pulses intact with no bruits Plus 2 bilateral LE edema Neuro non-focal Skin warm and dry No muscular weakness   Labs    High Sensitivity Troponin:   Recent Labs  Lab 04/15/19 1748 04/16/19 0339  TROPONINIHS 25* 25*      Chemistry Recent Labs  Lab 04/16/19 0339 04/17/19 0453 04/18/19 0359 04/19/19 0416 04/20/19 0429  NA 139   < > 137 137 139  K 4.0   < > 4.3 5.1 4.4  CL 107   < > 106 106 106  CO2 25   < > 25 23 27   GLUCOSE 195*   < > 190* 157* 126*  BUN 18   < > 26* 28* 29*  CREATININE 1.77*   < > 2.18* 2.14* 2.05*  CALCIUM 8.7*   < > 8.5* 8.6* 8.8*  PROT 6.1*  --   --   --   --  ALBUMIN 2.7*  --   --   --   --   AST 20  --   --   --   --   ALT 25  --   --   --   --   ALKPHOS 81  --   --   --   --   BILITOT 0.5  --   --   --   --   GFRNONAA 46*   < > 36* 37* 39*  GFRAA 54*   < > 42* 43* 45*  ANIONGAP 7   < > 6 8 6    < > = values in this interval not displayed.     Hematology Recent Labs  Lab 04/15/19 1748 04/16/19 0339 04/17/19 0453  WBC 7.0 5.7 5.0  RBC 3.62* 3.79*  3.73* 4.03*  HGB 9.7* 9.9* 10.7*  HCT 31.7* 32.9* 35.4*  MCV 87.6 86.8 87.8  MCH 26.8 26.1 26.6  MCHC 30.6 30.1 30.2  RDW 14.2 13.9 13.7  PLT 306 291 253    BNP Recent Labs  Lab 04/15/19 1812  BNP 135.0*     DDimer No results for input(s): DDIMER in the last 168 hours.   Radiology    CXR 04/15/19 mild CE no edema shallow inspiration reviewed   Cardiac Studies   Echo 04/18/19 1. Left ventricular ejection fraction, by estimation, is 60 to 65%. The left ventricle has normal function. The left ventricle has no regional wall motion abnormalities. There is moderate concentric left ventricular hypertrophy. Left ventricular diastolic parameters are indeterminate.  2. Right ventricular systolic  function is normal. The right ventricular size is normal.  3. Left atrial size was mildly dilated.  4. Right atrial size was mildly dilated.  5. The mitral valve is normal in structure and function. No evidence of mitral valve regurgitation. No evidence of mitral stenosis.  6. The aortic valve is normal in structure and function. Aortic valve regurgitation is not visualized. No aortic stenosis is present.  7. The inferior vena cava is normal in size with greater than 50% respiratory variability, suggesting right atrial pressure of 3 mmHg.   Patient Profile     44 y.o. male with a hx of type I diabetes complicated by neuropathy and gastroparesis, hypertension, stage 3 chronic kidney disease, recent Covid-19 infection with pneumonia requiring hospitalization, morbid obesity who is being seen for the evaluation of new onset heart failure at the request of Dr. Bonner Puna.  Assessment & Plan    Acute on chronic diastolic heart failure- improving on iv lasix. EF normal with E/E' < 15 only mildly elevated BNP 135 on 04/15/19 Should need another 48 hours of iv diuresis possible d/c on 310/21 with outpatient f/u Dr Harrell Gave  Hypertension: improved with adjustments not on ACE due to Cr elevation consider adding nitrates in future Now on coreg and hydralazine   DM:  Discussed low carb diet.  Target hemoglobin A1c is 6.5 or less.  Continue current medications.  CRF:  Cr stable with diuresis at 5.64 this am Complicates management of diastolic CHF   For questions or updates, please contact [place] Please consult [email or web address] for contact info under        Signed, Jenkins Rouge, MD  04/20/2019, 8:58 AM

## 2019-04-21 ENCOUNTER — Telehealth: Payer: Self-pay | Admitting: Cardiology

## 2019-04-21 LAB — GLUCOSE, CAPILLARY
Glucose-Capillary: 128 mg/dL — ABNORMAL HIGH (ref 70–99)
Glucose-Capillary: 161 mg/dL — ABNORMAL HIGH (ref 70–99)
Glucose-Capillary: 212 mg/dL — ABNORMAL HIGH (ref 70–99)
Glucose-Capillary: 144 mg/dL — ABNORMAL HIGH (ref 70–99)

## 2019-04-21 LAB — CBC
HCT: 37.7 % — ABNORMAL LOW (ref 39.0–52.0)
Hemoglobin: 11.3 g/dL — ABNORMAL LOW (ref 13.0–17.0)
MCH: 26.2 pg (ref 26.0–34.0)
MCHC: 30 g/dL (ref 30.0–36.0)
MCV: 87.3 fL (ref 80.0–100.0)
Platelets: 324 10*3/uL (ref 150–400)
RBC: 4.32 MIL/uL (ref 4.22–5.81)
RDW: 13.5 % (ref 11.5–15.5)
WBC: 6 10*3/uL (ref 4.0–10.5)
nRBC: 0 % (ref 0.0–0.2)

## 2019-04-21 LAB — BASIC METABOLIC PANEL
Anion gap: 8 (ref 5–15)
BUN: 31 mg/dL — ABNORMAL HIGH (ref 6–20)
CO2: 27 mmol/L (ref 22–32)
Calcium: 9 mg/dL (ref 8.9–10.3)
Chloride: 104 mmol/L (ref 98–111)
Creatinine, Ser: 2.32 mg/dL — ABNORMAL HIGH (ref 0.61–1.24)
GFR calc Af Amer: 39 mL/min — ABNORMAL LOW (ref >60)
GFR calc non Af Amer: 33 mL/min — ABNORMAL LOW (ref >60)
Glucose, Bld: 149 mg/dL — ABNORMAL HIGH (ref 70–99)
Potassium: 4.4 mmol/L (ref 3.5–5.1)
Sodium: 139 mmol/L (ref 135–145)

## 2019-04-21 LAB — MAGNESIUM: Magnesium: 2 mg/dL (ref 1.7–2.4)

## 2019-04-21 MED ORDER — CARVEDILOL 25 MG PO TABS
25.0000 mg | ORAL_TABLET | Freq: Two times a day (BID) | ORAL | Status: DC
  Administered 2019-04-21 – 2019-04-23 (×5): 25 mg via ORAL
  Filled 2019-04-21 (×5): qty 1

## 2019-04-21 MED ORDER — CARVEDILOL 25 MG PO TABS: 25.0000 mg | ORAL_TABLET | Freq: Two times a day (BID) | ORAL | Status: DC

## 2019-04-21 NOTE — Telephone Encounter (Signed)
Follow up scheduled

## 2019-04-21 NOTE — Telephone Encounter (Signed)
New message   Setup a TOC appt per Fabian Sharp with Jory Sims on 05/28/19 at 2:15 pm.

## 2019-04-21 NOTE — TOC Progression Note (Signed)
Transition of Care [facility]) - Progression Note    Patient Details  Name: Paxten Appelt MRN: [id number] Date of Birth: [birth year]  Transition of Care [facility]) CM/SW Contact  Purcell Mouton, RN Phone Number: 04/21/2019, 11:22 AM  Clinical Narrative:    TOC continuing to follow pt for discharge needs.    Expected Discharge Plan: Home/Self Care Barriers to Discharge: No Barriers Identified  Expected Discharge Plan and Services Expected Discharge Plan: Home/Self Care   Discharge Planning Services: CM Consult   Living arrangements for the past 2 months: Apartment                                       Social Determinants of Health (SDOH) Interventions    Readmission Risk Interventions No flowsheet data found.

## 2019-04-21 NOTE — Progress Notes (Signed)
PROGRESS NOTE  Duane [name]  WUJ:[id number] DOB: March 15, 1975 DOA: 04/15/2019 PCP: Charlott Rakes, MD   Brief Narrative: Duane [name] is a 44 y.o. male with a history of N5AO with complications including gastroparesis, neuropathy, and nephropathy with stage III CKD, as well as HTN, chronic HFpEF, and hospitalization for covid-19 pneumonia in January 2021 who presented to the ED 3/3 with shortness of breath, orthopnea, PND, and peripheral edema associated with gradual >20lbs weight gain in the setting of discontinuation of lasix.   Assessment & Plan: Principal Problem:   Acute on chronic diastolic heart failure (HCC) Active Problems:   Type I diabetes mellitus with complication, uncontrolled ([place])   CKD stage 3 secondary to diabetes (HCC)   Shortness of breath   Elevated troponin  Acute on chronic HFpEF, HTN: EDW possibly 154kg (at recent DC). Weight and exam still showing severe overload. Echocardiogram showing preserved EF, indeterminate diastolic function, IVC suggestive of euvolemia? Moderate LVH noted. - Continue lasix at 120mg  IV BID dosing, continue I/O (~3.3L UOP, weight down 370lbs >> 356lbs) - BPs much improved. Continue beta blocker changed to coreg, norvasc (substituted for diltiazem), hydralazine. Will benefit long term from RAS inhibition. Appreciate cardiology recommendations.   - Monitor I/O, daily weights, trend BMP, Mg.   Stage IIIa CKD: With proteinuria ~0.9g/day and hypoalbuminemia. Bland sediment otherwise. - Monitor creatinine daily while diuresing. Will need follow up with nephrology (sees Dr. Carolin Sicks at [facility]). Creatinine relatively stable, so no need for nephrology input at this time.  - As above, ACE/ARB would be beneficial longterm to limit proteinuria with diabetes.  IDT2DM: Poorly controlled with last HbA1c 11.2%. A1c has been above 10% for years, consistent with many complications.  - Continue lantus 20u daily, SSI. Added 3u novolog TIDWC with improved  control. - Continue reglan for gastroparesis - Continue lyrica for neuropathy  HLD:  - Continue statin  Troponin elevation: Due to relative myocardial demand mismatch with acute CHF. No chest pain, minimal elevation not consistent with ACS.  - Recommend outpatient risk stratification or more urgent work up if recommended by cardiology.  Anemia of CKD: No bleeding noted.   - Monitor CBC intermittently.  Morbid obesity: BMI falsely elevated due to volume status, though is likely truly >40.   History of covid-19 pneumonia: Resolved. No indication for isolation.   GERD:  - Continue PPI  DVT prophylaxis: Lovenox Code Status: Full Family Communication: None at bedside Disposition Plan: Return home once volume status is improved. Suspected dry weight is ~154kg. Depending on clinical trend, may be eligible for DC 3/10-3/11.  Consultants:   Cardiology  Procedures:   None  Antimicrobials:  None   Subjective: Shortness of breath with exertion is improved, swelling less but not back to normal. Urinating significantly. No chest pain reported or other complaints voiced.   Objective: Vitals:   04/20/19 1749 04/20/19 2147 04/21/19 0555 04/21/19 1605  BP: 121/74 (!) 152/87 (!) 149/76 138/77  Pulse: 88 89 90 87  Resp: 18 16 18 18   Temp:  97.7 F (36.5 C) 98.5 F (36.9 C) 98.1 F (36.7 C)  TempSrc:  Oral Oral Oral  SpO2: 91% 97% 97% 94%  Weight:   (!) 158.7 kg   Height:        Intake/Output Summary (Last 24 hours) at 04/21/2019 1735 Last data filed at 04/21/2019 1308 Gross per 24 hour  Intake 248.1 ml  Output 2900 ml  Net -2651.9 ml   Filed Weights   04/19/19 0451 04/20/19 0505 04/21/19 0555  Weight: (!) 165.5 kg (!) 161.6 kg (!) 158.7 kg   Gen: 44 y.o. male in no distress Pulm: Nonlabored breathing. Clear. CV: Regular rate and rhythm. No murmur, rub, or gallop. No definite JVD, stable, slightly improved pitting and nonpitting dependent edema. GI: Abdomen soft,  non-tender, non-distended, with normoactive bowel sounds.  Ext: Warm, no deformities Skin: No rashes, lesions or ulcers on visualized skin. Neuro: Alert and oriented. No focal neurological deficits. Psych: Judgement and insight appear fair. Mood euthymic & affect congruent. Behavior is appropriate.    Data Reviewed: I have personally reviewed following labs and imaging studies  CBC: Recent Labs  Lab 04/15/19 1748 04/16/19 0339 04/17/19 0453 04/21/19 0418  WBC 7.0 5.7 5.0 6.0  HGB 9.7* 9.9* 10.7* 11.3*  HCT 31.7* 32.9* 35.4* 37.7*  MCV 87.6 86.8 87.8 87.3  PLT 306 291 253 371   Basic Metabolic Panel: Recent Labs  Lab 04/17/19 0453 04/18/19 0359 04/19/19 0416 04/20/19 0429 04/21/19 0418  NA 138 137 137 139 139  K 4.6 4.3 5.1 4.4 4.4  CL 106 106 106 106 104  CO2 24 25 23 27 27   GLUCOSE 163* 190* 157* 126* 149*  BUN 19 26* 28* 29* 31*  CREATININE 1.83* 2.18* 2.14* 2.05* 2.32*  CALCIUM 8.7* 8.5* 8.6* 8.8* 9.0  MG 1.6* 1.8 2.0 1.9 2.0   Liver Function Tests: Recent Labs  Lab 04/16/19 0339  AST 20  ALT 25  ALKPHOS 81  BILITOT 0.5  PROT 6.1*  ALBUMIN 2.7*   Coagulation Profile: Recent Labs  Lab 04/16/19 0339  INR 1.0   BNP (last 3 results) Recent Labs    04/09/19 1511  PROBNP 153.0*   CBG: Recent Labs  Lab 04/20/19 1642 04/20/19 2137 04/21/19 0752 04/21/19 1249 04/21/19 1727  GLUCAP 200* 139* 128* 161* 212*   Continuous Infusions: . furosemide 120 mg (04/21/19 0952)     LOS: 6 days   Time spent: 25 minutes.  Patrecia Pour, MD [facility] [email or web address] 04/21/2019, 5:35 PM

## 2019-04-21 NOTE — Discharge Instructions (Addendum)
Do the following things EVERY DAY:  1. Weigh yourself EVERY morning after you go to the bathroom but before you eat or drink anything. Write this number down in a weight log/diary. If you gain 3 pounds overnight or 5 pounds in a week,this suggests you may be retaining fluid. Follow your diuretic plan or call the office for instruction.  2. Take your medicines as prescribed. If you have concerns about your medications, please call us before you stop taking them.   3. Eat low salt foods--Limit salt (sodium) to 2000 mg per day. This will help prevent your body from holding onto fluid. Read food labels as many processed foods have a lot of sodium, especially canned goods and prepackaged meats. If you would like some assistance choosing low sodium foods, we would be happy to set you up with a nutritionist.  4. Stay as active as you can everyday. Staying active will give you more energy and make your muscles stronger. Start with 5 minutes at a time and work your way up to 30 minutes a day. Break up your activities--do some in the morning and some in the afternoon. Start with 3 days per week and work your way up to 5 days as you can. If you have chest pain, feel short of breath, dizzy, or lightheaded, STOP. If you don't feel better after a short rest, call 911. If you do feel better, call the office to let us know you have symptoms with exercise.  5. Limit all fluids for the day to less than 2 liters. Fluid includes all drinks, coffee, juice, ice chips, soup, jello, and all other liquids.    Additional discharge instructions  Please get your medications reviewed and adjusted by your Primary MD.  Please request your Primary MD to go over all Hospital Tests and Procedure/Radiological results at the follow up, please get all Hospital records sent to your Prim MD by signing hospital release before you go home.  If you had Pneumonia of Lung problems at the Hospital: Please get a 2 view Chest X ray  done in 6-8 weeks after hospital discharge or sooner if instructed by your Primary MD.  If you have Congestive Heart Failure: Please call your Cardiologist or Primary MD anytime you have any of the following symptoms:  1) 3 pound weight gain in 24 hours or 5 pounds in 1 week  2) shortness of breath, with or without a dry hacking cough  3) swelling in the hands, feet or stomach  4) if you have to sleep on extra pillows at night in order to breathe  Follow cardiac low salt diet and 1.5 lit/day fluid restriction.  If you have diabetes Accuchecks 4 times/day, Once in AM empty stomach and then before each meal. Log in all results and show them to your primary doctor at your next visit. If any glucose reading is under 80 or above 300 call your primary MD immediately.  If you have Seizure/Convulsions/Epilepsy: Please do not drive, operate heavy machinery, participate in activities at heights or participate in high speed sports until you have seen by Primary MD or a Neurologist and advised to do so again.  If you had Gastrointestinal Bleeding: Please ask your Primary MD to check a complete blood count within one week of discharge or at your next visit. Your endoscopic/colonoscopic biopsies that are pending at the time of discharge, will also need to followed by your Primary MD.  Get Medicines reviewed and adjusted. Please take all  your medications with you for your next visit with your Primary MD  Please request your Primary MD to go over all hospital tests and procedure/radiological results at the follow up, please ask your Primary MD to get all Hospital records sent to his/her office.  If you experience worsening of your admission symptoms, develop shortness of breath, life threatening emergency, suicidal or homicidal thoughts you must seek medical attention immediately by calling 911 or calling your MD immediately  if symptoms less severe.  You must read complete instructions/literature along  with all the possible adverse reactions/side effects for all the Medicines you take and that have been prescribed to you. Take any new Medicines after you have completely understood and accpet all the possible adverse reactions/side effects.   Do not drive or operate heavy machinery when taking Pain medications.   Do not take more than prescribed Pain, Sleep and Anxiety Medications  Special Instructions: If you have smoked or chewed Tobacco  in the last 2 yrs please stop smoking, stop any regular Alcohol  and or any Recreational drug use.  Wear Seat belts while driving.  Please note You were cared for by a hospitalist during your hospital stay. If you have any questions about your discharge medications or the care you received while you were in the hospital after you are discharged, you can call the unit and asked to speak with the hospitalist on call if the hospitalist that took care of you is not available. Once you are discharged, your primary care physician will handle any further medical issues. Please note that NO REFILLS for any discharge medications will be authorized once you are discharged, as it is imperative that you return to your primary care physician (or establish a relationship with a primary care physician if you do not have one) for your aftercare needs so that they can reassess your need for medications and monitor your lab values.  You can reach the hospitalist office at phone [phone] or fax [phone]   If you do not have a primary care physician, you can call [phone] for a physician referral.

## 2019-04-21 NOTE — Telephone Encounter (Signed)
Left message for patient to call back to get scheduled with Jory Sims.

## 2019-04-21 NOTE — Telephone Encounter (Signed)
Forwarded to [facility]

## 2019-04-21 NOTE — Progress Notes (Addendum)
Progress Note  Patient Name: Duane [name] Date of Encounter: 04/21/2019  Primary Cardiologist: Buford Dresser, MD   Subjective   Pt denies pain. Feels like his fluid status is improving, but still edematous. Wearing O2 - will continue to wean. Do not suspect he will need home O2.  Inpatient Medications    Scheduled Meds:  amLODipine  10 mg Oral Daily   atorvastatin  20 mg Oral q1800   carvedilol  25 mg Oral BID WC   DULoxetine  60 mg Oral Daily   enoxaparin (LOVENOX) injection  80 mg Subcutaneous Q24H   hydrALAZINE  100 mg Oral TID   insulin aspart  0-15 Units Subcutaneous TID WC   insulin aspart  0-5 Units Subcutaneous QHS   insulin aspart  3 Units Subcutaneous TID WC   insulin glargine  20 Units Subcutaneous BID   metoCLOPramide  10 mg Oral TID AC   pantoprazole  40 mg Oral Daily   pregabalin  75 mg Oral BID   sodium chloride flush  3 mL Intravenous Q12H   Continuous Infusions:  furosemide Stopped (04/20/19 1930)   PRN Meds: acetaminophen **OR** acetaminophen   Vital Signs    Vitals:   04/20/19 1509 04/20/19 1749 04/20/19 2147 04/21/19 0555  BP: 120/77 121/74 (!) 152/87 (!) 149/76  Pulse: 86 88 89 90  Resp: 18 18 16 18   Temp:   97.7 F (36.5 C) 98.5 F (36.9 C)  TempSrc:   Oral Oral  SpO2: 96% 91% 97% 97%  Weight:    (!) 349 lb 13.9 oz (158.7 kg)  Height:        Intake/Output Summary (Last 24 hours) at 04/21/2019 0852 Last data filed at 04/21/2019 5361 Gross per 24 hour  Intake 248.1 ml  Output 4250 ml  Net -4001.9 ml   Last 3 Weights 04/21/2019 04/20/2019 04/19/2019  Weight (lbs) 349 lb 13.9 oz 356 lb 4.2 oz 364 lb 13.8 oz  Weight (kg) 158.7 kg 161.6 kg 165.5 kg      Telemetry    Sinus rhythm 80-90s - Personally Reviewed  ECG    No new tracings - Personally Reviewed  Physical Exam   GEN: No acute distress.   Neck: No JVD Cardiac: RRR, no murmurs, rubs, or gallops.  Respiratory: Clear to auscultation bilaterally. GI: Soft, nontender,  non-distended  MS: at least 1+ B LE edema Neuro:  Nonfocal  Psych: Normal affect   Labs    High Sensitivity Troponin:   Recent Labs  Lab 04/15/19 1748 04/16/19 0339  TROPONINIHS 25* 25*      Chemistry Recent Labs  Lab 04/16/19 0339 04/17/19 0453 04/19/19 0416 04/20/19 0429 04/21/19 0418  NA 139   < > 137 139 139  K 4.0   < > 5.1 4.4 4.4  CL 107   < > 106 106 104  CO2 25   < > 23 27 27   GLUCOSE 195*   < > 157* 126* 149*  BUN 18   < > 28* 29* 31*  CREATININE 1.77*   < > 2.14* 2.05* 2.32*  CALCIUM 8.7*   < > 8.6* 8.8* 9.0  PROT 6.1*  --   --   --   --   ALBUMIN 2.7*  --   --   --   --   AST 20  --   --   --   --   ALT 25  --   --   --   --  ALKPHOS 81  --   --   --   --   BILITOT 0.5  --   --   --   --   GFRNONAA 46*   < > 37* 39* 33*  GFRAA 54*   < > 43* 45* 39*  ANIONGAP 7   < > 8 6 8    < > = values in this interval not displayed.     Hematology Recent Labs  Lab 04/16/19 0339 04/17/19 0453 04/21/19 0418  WBC 5.7 5.0 6.0  RBC 3.79*  3.73* 4.03* 4.32  HGB 9.9* 10.7* 11.3*  HCT 32.9* 35.4* 37.7*  MCV 86.8 87.8 87.3  MCH 26.1 26.6 26.2  MCHC 30.1 30.2 30.0  RDW 13.9 13.7 13.5  PLT 291 253 324    BNP Recent Labs  Lab 04/15/19 1812  BNP 135.0*     DDimer No results for input(s): DDIMER in the last 168 hours.   Radiology    No results found.  Cardiac Studies   Echo 04/18/19 1. Left ventricular ejection fraction, by estimation, is 60 to 65%. The left ventricle has normal function. The left ventricle has no regional wall motion abnormalities. There is moderate concentric left ventricular hypertrophy. Left ventricular diastolic parameters are indeterminate.   2. Right ventricular systolic function is normal. The right ventricular size is normal.   3. Left atrial size was mildly dilated.   4. Right atrial size was mildly dilated.   5. The mitral valve is normal in structure and function. No evidence of mitral valve regurgitation. No evidence of  mitral stenosis.   6. The aortic valve is normal in structure and function. Aortic valve regurgitation is not visualized. No aortic stenosis is present.   7. The inferior vena cava is normal in size with greater than 50% respiratory variability, suggesting right atrial pressure of 3 mmHg.   Patient Profile     44 y.o. male with a hx of type I diabetes complicated by neuropathy and gastroparesis, hypertension, stage 3 chronic kidney disease, recent Covid-19 infection with pneumonia requiring hospitalization, morbid obesity who is being seen for the evaluation of new onset heart failure  Assessment & Plan    Acute on chronic diastolic heart failure - diuresing on 120 mg IV lasix BID - overall net negative 11L with 3.3 L urine output yesterday - weight is 350 lbs , down from 370 lbs on admission - sCr 2.32 with K 4.4 - home regimen was 60 mg lasix BID - will need to decide on outpatient regimen, but will likely be higher than his previous home dose - low albumin likely contributing - continue present lasix regimen - still edematous   Hypertension - clonidine D/C'ed 3/6 due to risk of rebound hypertension - changed labetalol to coreg 3/6 - continue hydralazine 100 mg TID - continue amlodipine 10 mg - pressures improved, but not at goal - will increase coreg to 25 mg BID, HR in the 80-90s - if more BP control needed, may consider bidil - will defer to nephrology for candidacy for ACEI/ARB   DM type 1 CKD stage 3b - complicates diuresis and limits pharmaceutical agents we can use - A1c 11.2% - needs better glucose control - appreciate OP nephrology input    I have added some discharge instructions to AVS. I will arrange outpatient follow up with cardiology.   For questions or updates, please contact [place] Please consult [email or web address] for contact info under        Signed,  Jenkins Rouge, MD  04/21/2019, 8:52 AM    Patient examined chart reviewed. Biggest issues are  poor BS control, CRF, morbid obesity. Good diuresis with iv lasix starting to get azotemic If Cr elevates again in am Will change to oral lasix 80 mg bid. Agree with increase in coreg for BP and HR control  Jenkins Rouge MD [facility]

## 2019-04-22 DIAGNOSIS — N179 Acute kidney failure, unspecified: Secondary | ICD-10-CM

## 2019-04-22 DIAGNOSIS — N189 Chronic kidney disease, unspecified: Secondary | ICD-10-CM

## 2019-04-22 LAB — BASIC METABOLIC PANEL
Anion gap: 9 (ref 5–15)
Anion gap: 6 (ref 5–15)
BUN: 38 mg/dL — ABNORMAL HIGH (ref 6–20)
BUN: 37 mg/dL — ABNORMAL HIGH (ref 6–20)
CO2: 27 mmol/L (ref 22–32)
CO2: 29 mmol/L (ref 22–32)
Calcium: 8.9 mg/dL (ref 8.9–10.3)
Calcium: 8.6 mg/dL — ABNORMAL LOW (ref 8.9–10.3)
Chloride: 103 mmol/L (ref 98–111)
Chloride: 102 mmol/L (ref 98–111)
Creatinine, Ser: 2.68 mg/dL — ABNORMAL HIGH (ref 0.61–1.24)
Creatinine, Ser: 2.7 mg/dL — ABNORMAL HIGH (ref 0.61–1.24)
GFR calc Af Amer: 33 mL/min — ABNORMAL LOW (ref >60)
GFR calc Af Amer: 32 mL/min — ABNORMAL LOW (ref >60)
GFR calc non Af Amer: 28 mL/min — ABNORMAL LOW (ref >60)
GFR calc non Af Amer: 28 mL/min — ABNORMAL LOW (ref >60)
Glucose, Bld: 154 mg/dL — ABNORMAL HIGH (ref 70–99)
Glucose, Bld: 226 mg/dL — ABNORMAL HIGH (ref 70–99)
Potassium: 4.4 mmol/L (ref 3.5–5.1)
Potassium: 4.2 mmol/L (ref 3.5–5.1)
Sodium: 139 mmol/L (ref 135–145)
Sodium: 137 mmol/L (ref 135–145)

## 2019-04-22 LAB — GLUCOSE, CAPILLARY
Glucose-Capillary: 179 mg/dL — ABNORMAL HIGH (ref 70–99)
Glucose-Capillary: 177 mg/dL — ABNORMAL HIGH (ref 70–99)
Glucose-Capillary: 192 mg/dL — ABNORMAL HIGH (ref 70–99)
Glucose-Capillary: 160 mg/dL — ABNORMAL HIGH (ref 70–99)

## 2019-04-22 LAB — MAGNESIUM: Magnesium: 2 mg/dL (ref 1.7–2.4)

## 2019-04-22 MED ORDER — FUROSEMIDE 40 MG PO TABS
80.0000 mg | ORAL_TABLET | Freq: Two times a day (BID) | ORAL | Status: DC
  Administered 2019-04-22 – 2019-04-23 (×2): 80 mg via ORAL
  Filled 2019-04-22 (×2): qty 2

## 2019-04-22 NOTE — Progress Notes (Signed)
Progress Note  Patient Name: Duane [name] Date of Encounter: 04/22/2019  Primary Cardiologist: Buford Dresser, MD   Subjective   Off oxygen feels good   Inpatient Medications    Scheduled Meds: . amLODipine  10 mg Oral Daily  . atorvastatin  20 mg Oral q1800  . carvedilol  25 mg Oral BID WC  . DULoxetine  60 mg Oral Daily  . enoxaparin (LOVENOX) injection  80 mg Subcutaneous Q24H  . furosemide  80 mg Oral BID  . hydrALAZINE  100 mg Oral TID  . insulin aspart  0-15 Units Subcutaneous TID WC  . insulin aspart  0-5 Units Subcutaneous QHS  . insulin aspart  3 Units Subcutaneous TID WC  . insulin glargine  20 Units Subcutaneous BID  . metoCLOPramide  10 mg Oral TID AC  . pantoprazole  40 mg Oral Daily  . pregabalin  75 mg Oral BID  . sodium chloride flush  3 mL Intravenous Q12H   Continuous Infusions:  PRN Meds: acetaminophen **OR** acetaminophen   Vital Signs    Vitals:   04/21/19 1605 04/21/19 2155 04/22/19 0500 04/22/19 0600  BP: 138/77 112/71  110/73  Pulse: 87 85  95  Resp: 18   18  Temp: 98.1 F (36.7 C) 98.3 F (36.8 C)  98.5 F (36.9 C)  TempSrc: Oral Oral  Oral  SpO2: 94%     Weight:   (!) 158.1 kg   Height:        Intake/Output Summary (Last 24 hours) at 04/22/2019 0924 Last data filed at 04/22/2019 0736 Gross per 24 hour  Intake 62 ml  Output 1750 ml  Net -1688 ml   Last 3 Weights 04/22/2019 04/21/2019 04/20/2019  Weight (lbs) 348 lb 8.8 oz 349 lb 13.9 oz 356 lb 4.2 oz  Weight (kg) 158.1 kg 158.7 kg 161.6 kg      Telemetry    Sinus rhythm 80-90s - Personally Reviewed  ECG    No new tracings - Personally Reviewed  Physical Exam   GEN: No acute distress.   Neck: No JVD Cardiac: RRR, no murmurs, rubs, or gallops.  Respiratory: Clear to auscultation bilaterally. GI: Soft, nontender, non-distended  MS: at least 1+ B LE edema Neuro:  Nonfocal  Psych: Normal affect   Labs    High Sensitivity Troponin:   Recent Labs  Lab  04/15/19 1748 04/16/19 0339  TROPONINIHS 25* 25*      Chemistry Recent Labs  Lab 04/16/19 0339 04/17/19 0453 04/20/19 0429 04/21/19 0418 04/22/19 0452  NA 139   < > 139 139 139  K 4.0   < > 4.4 4.4 4.4  CL 107   < > 106 104 103  CO2 25   < > 27 27 27   GLUCOSE 195*   < > 126* 149* 154*  BUN 18   < > 29* 31* 38*  CREATININE 1.77*   < > 2.05* 2.32* 2.68*  CALCIUM 8.7*   < > 8.8* 9.0 8.9  PROT 6.1*  --   --   --   --   ALBUMIN 2.7*  --   --   --   --   AST 20  --   --   --   --   ALT 25  --   --   --   --   ALKPHOS 81  --   --   --   --   BILITOT 0.5  --   --   --   --  GFRNONAA 46*   < > 39* 33* 28*  GFRAA 54*   < > 45* 39* 33*  ANIONGAP 7   < > 6 8 9    < > = values in this interval not displayed.     Hematology Recent Labs  Lab 04/16/19 0339 04/17/19 0453 04/21/19 0418  WBC 5.7 5.0 6.0  RBC 3.79*  3.73* 4.03* 4.32  HGB 9.9* 10.7* 11.3*  HCT 32.9* 35.4* 37.7*  MCV 86.8 87.8 87.3  MCH 26.1 26.6 26.2  MCHC 30.1 30.2 30.0  RDW 13.9 13.7 13.5  PLT 291 253 324    BNP Recent Labs  Lab 04/15/19 1812  BNP 135.0*     DDimer No results for input(s): DDIMER in the last 168 hours.   Radiology    No results found.  Cardiac Studies   Echo 04/18/19 1. Left ventricular ejection fraction, by estimation, is 60 to 65%. The left ventricle has normal function. The left ventricle has no regional wall motion abnormalities. There is moderate concentric left ventricular hypertrophy. Left ventricular diastolic parameters are indeterminate.   2. Right ventricular systolic function is normal. The right ventricular size is normal.   3. Left atrial size was mildly dilated.   4. Right atrial size was mildly dilated.   5. The mitral valve is normal in structure and function. No evidence of mitral valve regurgitation. No evidence of mitral stenosis.   6. The aortic valve is normal in structure and function. Aortic valve regurgitation is not visualized. No aortic stenosis is  present.   7. The inferior vena cava is normal in size with greater than 50% respiratory variability, suggesting right atrial pressure of 3 mmHg.   Patient Profile     44 y.o. male with a hx of type I diabetes complicated by neuropathy and gastroparesis, hypertension, stage 3 chronic kidney disease, recent Covid-19 infection with pneumonia requiring hospitalization, morbid obesity who is being seen for the evaluation of new onset heart failure  Assessment & Plan    Acute on chronic diastolic heart failure-  Azotemic now d/c iv lasix Normal home dose 60 bid will write for 80 PO bid Weight down 8 lbs consider d/c tomorrow if Cr stabilized on lower dose PO diuretic   Hypertension- now off clonidine continue hydralazine , coreg and amlodipine  DM type 1- poorly controlled A1c 11.2 per primary service   CKD stage 3 - backing off on diuresis check in am if heading down d/c home     I have added some discharge instructions to AVS. I will arrange outpatient follow up with cardiology.   For questions or updates, please contact [place] Please consult [email or web address] for contact info under      Signed, Jenkins Rouge, MD  04/22/2019, 9:24 AM

## 2019-04-22 NOTE — Plan of Care (Signed)
  Problem: Clinical Measurements: Goal: Respiratory complications will improve Outcome: Progressing Goal: Cardiovascular complication will be avoided Outcome: Progressing   

## 2019-04-22 NOTE — Progress Notes (Signed)
PROGRESS NOTE   Duane [name]  FUX:[id number]    DOB: 01/21/1976    DOA: 04/15/2019  PCP: Charlott Rakes, MD   I have briefly reviewed patients previous medical records in [facility].  Chief Complaint:   Chief Complaint  Patient presents with  . Shortness of Breath  . Chest Pain    Brief Narrative:  44 year old male with PMH of type II DM complicated by gastroparesis, neuropathy and nephropathy, stage III CKD, HTN, chronic diastolic CHF, hospitalized January 2021 for COVID-19 who presented to the ED 04/15/2019 with worsening dyspnea, orthopnea, PND, peripheral edema and weight gain in the setting of discontinuation of Lasix.  Admitted for acute on chronic diastolic CHF.  Cardiology consulted, treated with IV Lasix, course complicated by acute on chronic kidney disease, IV Lasix changed to oral, follow BMP in a.m.   Assessment & Plan:  Principal Problem:   Acute on chronic diastolic heart failure (HCC) Active Problems:   Type I diabetes mellitus with complication, uncontrolled ([place])   CKD stage 3 secondary to diabetes (HCC)   Shortness of breath   Elevated troponin   Acute on chronic diastolic CHF: EDW possibly 154 kg (at recent DC).  TTE showed preserved LVEF, indeterminate diastolic dysfunction.  Treated initially with IV Lasix 120 mg twice daily.  -13.4 L thus far.  Weight down from 370 pounds on admission to 348 pounds.  Now having azotemia, Cardiology follow-up appreciated, discussed with Dr. Johnsie Cancel, after this morning's dose of IV Lasix, transition to oral Lasix and if creatinine stable or improves, possible discharge home 3/11.  Recommended compression stockings for lower extremities to mobilize fluids, discussed with RN at bedside.  Essential hypertension: Controlled on amlodipine 10 mg daily, carvedilol 25 mg twice daily, hydralazine 100 mg 3 times daily.  Will benefit from long-term RAAS inhibition but this can be considered as outpatient.  Acute on stage IIIa CKD:  Associated proteinuria and hypoalbuminemia.  Creatinine gradually increased from 2.05 on 3/8-2.7 today.  Likely secondary to IV diuresis, discontinued and changed to oral Lasix today.  Follow BMP in a.m.  Will need to follow-up with Dr. Carolin Sicks, nephrology at [facility].  ACEI/ARB would be beneficial in the long-term given diabetes and proteinuria but will defer to outpatient follow-up.  Poorly controlled type II DM: A1c 11.2.  Reasonable inpatient control on Lantus 20 units twice daily, NovoLog mealtime and SSI.  Continue Reglan for gastroparesis and Lyrica for peripheral neuropathy.  Hyperlipidemia: Continue statins.  Elevated troponin: Likely due to demand ischemia from acute CHF.  No anginal symptoms.  Outpatient follow-up with cardiology for restratification.  Anemia of CKD: Stable.  GERD: Continue PPI.  History of COVID-19 pneumonia: Resolved.  No isolation indicated.  Body mass index is 42.43 kg/m./Morbid obesity   DVT prophylaxis: Lovenox Code Status: Full Family Communication: None at bedside Disposition:  . Patient came from: Home           . Anticipated d/c place: Home . Barriers to d/c: Pending stability or improvement of renal parameters, hopefully 3/11   Consultants:   Cardiology  Procedures:   None  Antimicrobials:   None   Subjective:  Feels better.  Dyspnea improved and almost back to baseline.  No pain reported.  Leg swelling improved but still has significant swelling without pain  Objective:   Vitals:   04/22/19 1300 04/22/19 1400 04/22/19 1500 04/22/19 1600  BP: 109/76     Pulse: 96     Resp: 12 11 15  16  Temp: 98.6 F (37 C)     TempSrc: Oral     SpO2: 96%     Weight:      Height:        General exam: Pleasant young male, morbidly obese and moderately built lying comfortably propped up in bed without distress. Respiratory system: Clear to auscultation. Respiratory effort normal. Cardiovascular system: S1 & S2 heard, RRR.  No JVD, murmurs, rubs, gallops or clicks.  2+ pitting bilateral leg edema.  Telemetry personally reviewed: Sinus rhythm. Gastrointestinal system: Abdomen is nondistended, soft and nontender. No organomegaly or masses felt. Normal bowel sounds heard. Central nervous system: Alert and oriented. No focal neurological deficits. Extremities: Symmetric 5 x 5 power. Skin: No rashes, lesions or ulcers Psychiatry: Judgement and insight appear normal. Mood & affect appropriate.     Data Reviewed:   I have personally reviewed following labs and imaging studies   CBC: Recent Labs  Lab 04/16/19 0339 04/17/19 0453 04/21/19 0418  WBC 5.7 5.0 6.0  HGB 9.9* 10.7* 11.3*  HCT 32.9* 35.4* 37.7*  MCV 86.8 87.8 87.3  PLT 291 253 235    Basic Metabolic Panel: Recent Labs  Lab 04/20/19 0429 04/20/19 0429 04/21/19 0418 04/22/19 0452 04/22/19 1026  NA 139   < > 139 139 137  K 4.4   < > 4.4 4.4 4.2  CL 106   < > 104 103 102  CO2 27   < > 27 27 29   GLUCOSE 126*   < > 149* 154* 226*  BUN 29*   < > 31* 38* 37*  CREATININE 2.05*   < > 2.32* 2.68* 2.70*  CALCIUM 8.8*   < > 9.0 8.9 8.6*  MG 1.9  --  2.0 2.0  --    < > = values in this interval not displayed.    Liver Function Tests: Recent Labs  Lab 04/16/19 0339  AST 20  ALT 25  ALKPHOS 81  BILITOT 0.5  PROT 6.1*  ALBUMIN 2.7*    CBG: Recent Labs  Lab 04/22/19 0811 04/22/19 1238 04/22/19 1631  GLUCAP 179* 177* 192*    Microbiology Studies:  No results found for this or any previous visit (from the past 240 hour(s)).   Radiology Studies:  No results found.   Scheduled Meds:   . amLODipine  10 mg Oral Daily  . atorvastatin  20 mg Oral q1800  . carvedilol  25 mg Oral BID WC  . DULoxetine  60 mg Oral Daily  . enoxaparin (LOVENOX) injection  80 mg Subcutaneous Q24H  . furosemide  80 mg Oral BID  . hydrALAZINE  100 mg Oral TID  . insulin aspart  0-15 Units Subcutaneous TID WC  . insulin aspart  0-5 Units Subcutaneous  QHS  . insulin aspart  3 Units Subcutaneous TID WC  . insulin glargine  20 Units Subcutaneous BID  . metoCLOPramide  10 mg Oral TID AC  . pantoprazole  40 mg Oral Daily  . pregabalin  75 mg Oral BID  . sodium chloride flush  3 mL Intravenous Q12H    Continuous Infusions:     LOS: 7 days     Vernell Leep, MD, [place], [facility]    To contact the attending provider between 7A-7P or the covering provider during after hours 7P-7A, please log into the web site [email or web address] and access using universal  password for that web site. If you do not have the password, please call the hospital operator.  04/22/2019, 6:01 PM

## 2019-04-23 LAB — BASIC METABOLIC PANEL
Anion gap: 10 (ref 5–15)
BUN: 40 mg/dL — ABNORMAL HIGH (ref 6–20)
CO2: 27 mmol/L (ref 22–32)
Calcium: 8.7 mg/dL — ABNORMAL LOW (ref 8.9–10.3)
Chloride: 100 mmol/L (ref 98–111)
Creatinine, Ser: 2.76 mg/dL — ABNORMAL HIGH (ref 0.61–1.24)
GFR calc Af Amer: 31 mL/min — ABNORMAL LOW (ref >60)
GFR calc non Af Amer: 27 mL/min — ABNORMAL LOW (ref >60)
Glucose, Bld: 195 mg/dL — ABNORMAL HIGH (ref 70–99)
Potassium: 4.8 mmol/L (ref 3.5–5.1)
Sodium: 137 mmol/L (ref 135–145)

## 2019-04-23 LAB — GLUCOSE, CAPILLARY
Glucose-Capillary: 204 mg/dL — ABNORMAL HIGH (ref 70–99)
Glucose-Capillary: 120 mg/dL — ABNORMAL HIGH (ref 70–99)

## 2019-04-23 LAB — MAGNESIUM: Magnesium: 2 mg/dL (ref 1.7–2.4)

## 2019-04-23 MED ORDER — AMLODIPINE BESYLATE 10 MG PO TABS: 10.0000 mg | ORAL_TABLET | Freq: Every day | ORAL | 0 refills | Status: AC

## 2019-04-23 MED ORDER — FUROSEMIDE 40 MG PO TABS: 80.0000 mg | ORAL_TABLET | Freq: Two times a day (BID) | ORAL | 0 refills | Status: AC

## 2019-04-23 MED ORDER — RIFAXIMIN 550 MG PO TABS: 550.0000 mg | ORAL_TABLET | Freq: Every day | ORAL | Status: AC | PRN

## 2019-04-23 MED ORDER — HYDRALAZINE HCL 100 MG PO TABS: 100.0000 mg | ORAL_TABLET | Freq: Three times a day (TID) | ORAL | 0 refills | Status: AC

## 2019-04-23 MED ORDER — CARVEDILOL 25 MG PO TABS: 25.0000 mg | ORAL_TABLET | Freq: Two times a day (BID) | ORAL | 0 refills | Status: AC

## 2019-04-23 MED FILL — hydrALAZINE HCL 100 MG TABS: 100 | 30 days supply | Qty: 90 | Fill #0

## 2019-04-23 MED FILL — AMLODIPINE BESYLATE 10 MG T: 10 | 30 days supply | Qty: 30 | Fill #0

## 2019-04-23 MED FILL — CARVEDILOL 25 MG TABLET: 25 | 30 days supply | Qty: 60 | Fill #0

## 2019-04-23 MED FILL — ?FUROSEMIDE 40 MG TABLET: 40 | 30 days supply | Qty: 120 | Fill #0

## 2019-04-23 NOTE — Care Management Important Message (Signed)
Important Message  Patient Details IM Letter given to Gabriel Earing RN Case Manager to present to the Patient Name: Duane [name] MRN: [id number] Date of Birth: Dec 26, 1975   Medicare Important Message Given:  Yes     Kerin Salen 04/23/2019, 11:34 AM

## 2019-04-23 NOTE — Discharge Summary (Signed)
Physician Discharge Summary  Duane [name] ERD:[id number] DOB: 08-15-1975  PCP: Charlott Rakes, MD  Admitted from: Home Discharged to: Home  Admit date: 04/15/2019 Discharge date: 04/23/2019  Recommendations for Outpatient Follow-up:   Follow-up Information    [facility] Follow up on 05/08/2019.   Why: You will go get labs next week (CBC & BMP). Which is a walk in. Appointment at 8:50 AM for follow up with doctor.  Contact information: [place] [phone]       Rosita Fire, MD. Schedule an appointment as soon as possible for a visit in 1 week(s).   Specialties: Nephrology, Internal Medicine Contact information: [place] [address] [phone]          Patient has a follow-up appointment with Dr. Al Pimple, Cardiology on 3/18.  He has received a call from their office advising him to come in for lab work Thursday of next week.  He is aware to get his lab work done either through the cardiologist office or through his PCP.  Patient also has a follow-up appointment with Dr. Elayne Snare, Endocrinology on 3/16  Home Health: None Equipment/Devices: None  Discharge Condition: Improved and stable CODE STATUS: Full Diet recommendation: Heart healthy and diabetic diet.  Discharge Diagnoses:  Principal Problem:   Acute on chronic diastolic heart failure (HCC) Active Problems:   Type I diabetes mellitus with complication, uncontrolled (HCC)   CKD stage 3 secondary to diabetes (HCC)   Shortness of breath   Elevated troponin   Brief Summary: 44 year old male with PMH of type II DM/IDDM complicated by gastroparesis, neuropathy and nephropathy, stage III CKD, HTN, chronic diastolic CHF, hospitalized January 2021 for COVID-19 who presented to the ED 04/15/2019 with worsening dyspnea, orthopnea, PND, peripheral edema and weight gain in the setting of discontinuation of Lasix.   Admitted for acute on chronic diastolic CHF.  Cardiology consulted, treated with IV Lasix, course complicated by acute on chronic kidney disease, IV Lasix changed to oral.  Stabilized.   Assessment & Plan:   Acute on chronic diastolic CHF: EDW possibly 154 kg (at recent DC).  TTE showed preserved LVEF, indeterminate diastolic dysfunction.  Treated initially with IV Lasix 120 mg twice daily.  - 14.6 L thus far.  Weight down from 370 pounds on admission to 347 pounds on day of discharge.  Now having azotemia, Cardiology transitioned him to oral Lasix yesterday.  Recommended compression stockings for lower extremities to mobilize fluids.  Creatinine has stabilized in the 2.7 range.  Cardiology follow-up appreciated and have cleared him for discharge home on Lasix 80 mg twice daily.  Their offices have contacted patient with a follow-up appointment and for labs next week.  Improved and stable.  Essential hypertension: Controlled on amlodipine 10 mg daily, carvedilol 25 mg twice daily, hydralazine 100 mg 3 times daily, continue at discharge.  Will benefit from long-term RAAS inhibition but this can be considered as outpatient.  Prior home Cardizem and labetalol discontinued.  Acute on stage IIIa CKD: Associated proteinuria and hypoalbuminemia.  Creatinine gradually increased from 2.05 on 3/8-2.7.  Likely secondary to IV diuresis, discontinued and changed to oral Lasix day prior to discharge.  Creatinine today has stabilized in the 2.7 range.  Close outpatient follow-up with repeat BMP. Will need to follow-up with Dr. Carolin Sicks, Nephrology at [facility].  ACEI/ARB would be beneficial in the long-term given diabetes and proteinuria but will defer to outpatient follow-up.  Baseline creatinine not clearly known but creatinine in February ranged between 1.7 and 2.3.  Poorly controlled type II DM: A1c 11.2.  Reasonable inpatient control on Lantus 20 units twice daily, NovoLog mealtime and SSI.   Continue Reglan for gastroparesis and Lyrica for peripheral neuropathy.  Continue prior home meds and has upcoming follow-up with Endocrinology.  Hyperlipidemia: Continue statins.  Elevated troponin: Likely due to demand ischemia from acute CHF.  No anginal symptoms.  Outpatient follow-up with cardiology for restratification.  Anemia of CKD: Stable.  GERD: Continue PPI.  Discontinued sucralfate due to renal insufficiency.  History of COVID-19 pneumonia: Resolved.  No isolation indicated.  Body mass index is 42.43 kg/m./Morbid obesity: Counseled regarding lifestyle modification and needs to lose weight.  Suspected OSA: Patient has transient desaturations in the mid to high 80s while sleeping associated with some snoring.  He reports that he has never been evaluated for sleep apnea.  Recommended outpatient follow-up with PCP to coordinate evaluation with sleep study.  Consultants:   Cardiology  Procedures:   None    Discharge Instructions  Discharge Instructions    (HEART FAILURE PATIENTS) Call MD:  Anytime you have any of the following symptoms: 1) 3 pound weight gain in 24 hours or 5 pounds in 1 week 2) shortness of breath, with or without a dry hacking cough 3) swelling in the hands, feet or stomach 4) if you have to sleep on extra pillows at night in order to breathe.   Complete by: As directed    Call MD for:  difficulty breathing, headache or visual disturbances   Complete by: As directed    Call MD for:  extreme fatigue   Complete by: As directed    Call MD for:  persistant dizziness or light-headedness   Complete by: As directed    Call MD for:  persistant nausea and vomiting   Complete by: As directed    Call MD for:  severe uncontrolled pain   Complete by: As directed    Call MD for:  temperature >100.4   Complete by: As directed    Diet - low sodium heart healthy   Complete by: As directed    Diet Carb Modified   Complete by: As directed    Increase  activity slowly   Complete by: As directed        Medication List    STOP taking these medications   azithromycin 250 MG tablet Commonly known as: ZITHROMAX   dexamethasone 4 MG tablet Commonly known as: DECADRON   diltiazem 180 MG 24 hr capsule Commonly known as: CARDIZEM CD   gabapentin 300 MG capsule Commonly known as: NEURONTIN   labetalol 200 MG tablet Commonly known as: NORMODYNE   Peppermint Oil 90 MG Cpcr Commonly known as: IBgard   potassium chloride 10 MEQ CR capsule Commonly known as: MICRO-K   sucralfate 1 g tablet Commonly known as: CARAFATE   Zenpep 40000-126000 units Cpep Generic drug: Pancrelipase (Lip-Prot-Amyl)     TAKE these medications   albuterol 108 (90 Base) MCG/ACT inhaler Commonly known as: VENTOLIN HFA Inhale 2 puffs into the lungs every 6 (six) hours as needed for wheezing or shortness of breath.   amLODipine 10 MG tablet Commonly known as: NORVASC Take 1 tablet (10 mg total) by mouth daily. Start taking on: April 24, 2019   atorvastatin 20 MG tablet Commonly known as: LIPITOR Take 1 tablet (20 mg total) by mouth daily.   carvedilol 25 MG tablet Commonly known  as: COREG Take 1 tablet (25 mg total) by mouth 2 (two) times daily with a meal.   DULoxetine 60 MG capsule Commonly known as: Cymbalta Take 1 capsule (60 mg total) by mouth daily.   furosemide 40 MG tablet Commonly known as: LASIX Take 2 tablets (80 mg total) by mouth 2 (two) times daily. What changed: how much to take   glucose blood test strip Commonly known as: True Metrix Blood Glucose Test 1 each by Other route 3 (three) times daily.   glucose blood test strip Commonly known as: Contour Next Test Use as instructed   glucose blood test strip Commonly known as: Contour Next Test Use as instructed   hydrALAZINE 100 MG tablet Commonly known as: APRESOLINE Take 1 tablet (100 mg total) by mouth 3 (three) times daily.   insulin lispro 100 UNIT/ML  KwikPen Commonly known as: HumaLOG KwikPen INJECT 15 UNITS INTO THE SKIN 3 TIMES DAILY. What changed:   how much to take  how to take this  when to take this  additional instructions   Insulin Pen Needle 31G X 8 MM Misc Inject 1 each into the skin 4 (four) times daily. Check blood sugar TID and QHS   Lantus SoloStar 100 UNIT/ML Solostar Pen Generic drug: insulin glargine Inject 40 Units into the skin 2 (two) times daily.   methocarbamol 500 MG tablet Commonly known as: Robaxin Take 1 tablet (500 mg total) by mouth every 8 (eight) hours as needed for muscle spasms.   metoCLOPramide 10 MG tablet Commonly known as: REGLAN Take a half hour before meals What changed:   how much to take  how to take this  when to take this  additional instructions   pantoprazole 40 MG tablet Commonly known as: PROTONIX Take 1 tablet (40 mg total) by mouth daily.   pregabalin 75 MG capsule Commonly known as: Lyrica Take 1 capsule (75 mg total) by mouth 2 (two) times daily.   promethazine 25 MG tablet Commonly known as: PHENERGAN TAKE 1 TABLET BY MOUTH EVERY 8 HOURS AS NEEDED FOR NAUSEA OR VOMITING. What changed:   how much to take  how to take this  when to take this  reasons to take this  additional instructions   rifaximin 550 MG Tabs tablet Commonly known as: Xifaxan Take 1 tablet (550 mg total) by mouth daily as needed (Bacterial infrction in stomach). What changed: See the new instructions.   terbinafine 250 MG tablet Commonly known as: LamISIL Take 1 tablet (250 mg total) by mouth daily.   True Metrix Meter w/Device Kit 1 each by Does not apply route 4 (four) times daily -  before meals and at bedtime.   TRUEplus Lancets 28G Misc 1 each by Does not apply route 4 (four) times daily -  before meals and at bedtime.      No Known Allergies    Procedures/Studies: DG Chest 2 View  Result Date: 04/15/2019 CLINICAL DATA:  Chest pain and dyspnea.  COVID  recovered. EXAM: CHEST - 2 VIEW COMPARISON:  March 01, 2019. FINDINGS: There is stable mild cardiomegaly. Central pulmonary vascular congestion is present without overt pulmonary edema or obvious pleural effusion. Inspiration is shallow. There is no pneumonia or pneumothorax. Mild skeletal degenerative change and telemetry leads are present. There is bilateral gynecomastia. IMPRESSION: Stable mild cardiomegaly and shallow inspiration. No pneumonia or pulmonary edema. Electronically Signed   By: Revonda Humphrey   On: 04/15/2019 18:12   ECHOCARDIOGRAM COMPLETE  Result Date:  04/18/2019    ECHOCARDIOGRAM REPORT   Patient Name:   Duane [name] Date of Exam: 04/18/2019 Medical Rec #:  [id number]     Height:       76.0 in Accession #:    [id number]    Weight:       369.9 lb Date of Birth:  01-17-1976     BSA:          2.877 m Patient Age:    89 years      BP:           132/90 mmHg Patient Gender: M             HR:           77 bpm. Exam Location:  Inpatient Procedure: 2D Echo, Cardiac Doppler and Color Doppler Indications:    I50.23 Acute on chronic systolic (congestive) heart failure  History:        Patient has prior history of Echocardiogram examinations, most                 recent 10/27/2018. Risk Factors:Hypertension, Diabetes and                 Dyslipidemia. Neuromuscular Disorder. CKD. GERD. COVID19.  Sonographer:    Tiffany Dance Referring Phys: [id number] [place]  1. Left ventricular ejection fraction, by estimation, is 60 to 65%. The left ventricle has normal function. The left ventricle has no regional wall motion abnormalities. There is moderate concentric left ventricular hypertrophy. Left ventricular diastolic parameters are indeterminate.  2. Right ventricular systolic function is normal. The right ventricular size is normal.  3. Left atrial size was mildly dilated.  4. Right atrial size was mildly dilated.  5. The mitral valve is normal in structure and function. No evidence of  mitral valve regurgitation. No evidence of mitral stenosis.  6. The aortic valve is normal in structure and function. Aortic valve regurgitation is not visualized. No aortic stenosis is present.  7. The inferior vena cava is normal in size with greater than 50% respiratory variability, suggesting right atrial pressure of 3 mmHg. FINDINGS  Left Ventricle: Left ventricular ejection fraction, by estimation, is 60 to 65%. The left ventricle has normal function. The left ventricle has no regional wall motion abnormalities. The left ventricular internal cavity size was normal in size. There is  moderate concentric left ventricular hypertrophy. Left ventricular diastolic parameters are indeterminate. Right Ventricle: The right ventricular size is normal. No increase in right ventricular wall thickness. Right ventricular systolic function is normal. Left Atrium: Left atrial size was mildly dilated. Right Atrium: Right atrial size was mildly dilated. Pericardium: A small pericardial effusion is present. The pericardial effusion is surrounding the apex. There is no evidence of cardiac tamponade. Mitral Valve: The mitral valve is normal in structure and function. Normal mobility of the mitral valve leaflets. No evidence of mitral valve regurgitation. No evidence of mitral valve stenosis. Tricuspid Valve: The tricuspid valve is normal in structure. Tricuspid valve regurgitation is not demonstrated. No evidence of tricuspid stenosis. Aortic Valve: The aortic valve is normal in structure and function. Aortic valve regurgitation is not visualized. No aortic stenosis is present. Pulmonic Valve: The pulmonic valve was normal in structure. Pulmonic valve regurgitation is not visualized. No evidence of pulmonic stenosis. Aorta: The aortic root is normal in size and structure. Venous: The inferior vena cava is normal in size with greater than 50% respiratory variability, suggesting right atrial pressure of 3 mmHg. IAS/Shunts:  No atrial  level shunt detected by color flow Doppler.  LEFT VENTRICLE PLAX 2D LVIDd:         4.10 cm  Diastology LVIDs:         3.00 cm  LV e' lateral:   8.70 cm/s LV PW:         1.60 cm  LV E/e' lateral: 8.1 LV IVS:        1.50 cm  LV e' medial:    6.20 cm/s LVOT diam:     2.00 cm  LV E/e' medial:  11.4 LV SV:         79 LV SV Index:   27 LVOT Area:     3.14 cm  RIGHT VENTRICLE             IVC RV Basal diam:  2.80 cm     IVC diam: 1.70 cm RV S prime:     12.50 cm/s TAPSE (M-mode): 2.3 cm LEFT ATRIUM             Index       RIGHT ATRIUM           Index LA diam:        4.20 cm 1.46 cm/m  RA Area:     21.20 cm LA Vol (A2C):   79.5 ml 27.63 ml/m RA Volume:   71.70 ml  24.92 ml/m LA Vol (A4C):   66.8 ml 23.22 ml/m LA Biplane Vol: 77.1 ml 26.80 ml/m  AORTIC VALVE LVOT Vmax:   111.50 cm/s LVOT Vmean:  80.200 cm/s LVOT VTI:    0.250 m  AORTA Ao Root diam: 3.40 cm Ao Asc diam:  2.80 cm MITRAL VALVE MV Area (PHT): 2.17 cm    SHUNTS MV Decel Time: 349 msec    Systemic VTI:  0.25 m MV E velocity: 70.90 cm/s  Systemic Diam: 2.00 cm MV A velocity: 73.40 cm/s MV E/A ratio:  0.97 Mihai Croitoru MD Electronically signed by Sanda Klein MD Signature Date/Time: 04/18/2019/4:00:11 PM    Final       Subjective: Patient interviewed and examined along with his RN in room.  Patient denies complaints.  No dyspnea or chest pain reported.  Breathing back to baseline.  Leg swellings have improved even more with bilateral knee-high TED stockings.  Discharge Exam:  Vitals:   04/22/19 2316 04/23/19 0524 04/23/19 0531 04/23/19 1336  BP: 140/71 (!) 147/89  120/75  Pulse: (!) 53 87  84  Resp: 20   20  Temp: 98.6 F (37 C) 98.6 F (37 C)  98.6 F (37 C)  TempSrc: Oral Oral  Oral  SpO2: 94% 94%  91%  Weight:   (!) 157.6 kg   Height:       General exam: Pleasant young male, morbidly obese and moderately built lying comfortably propped up in bed without distress. Respiratory system: Clear to auscultation. Respiratory effort  normal. Cardiovascular system: S1 & S2 heard, RRR. No JVD, murmurs, rubs, gallops or clicks.  1+ pitting bilateral leg edema. Has bilateral knee-high TED stockings.  Telemetry personally reviewed: Sinus rhythm. Gastrointestinal system: Abdomen is nondistended, soft and nontender. No organomegaly or masses felt. Normal bowel sounds heard. Central nervous system: Alert and oriented. No focal neurological deficits. Extremities: Symmetric 5 x 5 power. Skin: No rashes, lesions or ulcers Psychiatry: Judgement and insight appear normal. Mood & affect appropriate.    The results of significant diagnostics from this hospitalization (including imaging, microbiology, ancillary and laboratory) are listed  below for reference.     Microbiology: No results found for this or any previous visit (from the past 240 hour(s)).   Labs: CBC: Recent Labs  Lab 04/17/19 0453 04/21/19 0418  WBC 5.0 6.0  HGB 10.7* 11.3*  HCT 35.4* 37.7*  MCV 87.8 87.3  PLT 253 225    Basic Metabolic Panel: Recent Labs  Lab 04/19/19 0416 04/19/19 0416 04/20/19 0429 04/21/19 0418 04/22/19 0452 04/22/19 1026 04/23/19 0318  NA 137   < > 139 139 139 137 137  K 5.1   < > 4.4 4.4 4.4 4.2 4.8  CL 106   < > 106 104 103 102 100  CO2 23   < > 27 27 27 29 27   GLUCOSE 157*   < > 126* 149* 154* 226* 195*  BUN 28*   < > 29* 31* 38* 37* 40*  CREATININE 2.14*   < > 2.05* 2.32* 2.68* 2.70* 2.76*  CALCIUM 8.6*   < > 8.8* 9.0 8.9 8.6* 8.7*  MG 2.0  --  1.9 2.0 2.0  --  2.0   < > = values in this interval not displayed.    Liver Function Tests: No results for input(s): AST, ALT, ALKPHOS, BILITOT, PROT, ALBUMIN in the last 168 hours.  CBG: Recent Labs  Lab 04/22/19 1238 04/22/19 1631 04/22/19 2106 04/23/19 0735 04/23/19 1151  GLUCAP 177* 192* 160* 204* 120*    Urinalysis    Component Value Date/Time   COLORURINE STRAW (A) 04/18/2019 1202   APPEARANCEUR CLEAR 04/18/2019 1202   LABSPEC 1.008 04/18/2019 1202    PHURINE 6.0 04/18/2019 1202   GLUCOSEU NEGATIVE 04/18/2019 1202   HGBUR NEGATIVE 04/18/2019 1202   BILIRUBINUR NEGATIVE 04/18/2019 1202   BILIRUBINUR negative 06/01/2016 1059   KETONESUR NEGATIVE 04/18/2019 1202   PROTEINUR 100 (A) 04/18/2019 1202   UROBILINOGEN 0.2 06/01/2016 1059   UROBILINOGEN 0.2 06/27/2014 0748   NITRITE NEGATIVE 04/18/2019 1202   LEUKOCYTESUR NEGATIVE 04/18/2019 1202      Time coordinating discharge: 45 minutes  SIGNED:  Vernell Leep, MD, FACP, [facility]. [facility]  To contact the attending provider between 7A-7P or the covering provider during after hours 7P-7A, please log into the web site [email or web address] and access using universal [place] password for that web site. If you do not have the password, please call the hospital operator.

## 2019-04-23 NOTE — TOC Progression Note (Signed)
Transition of Care [facility]) - Progression Note    Patient Details  Name: Duane [name] MRN: [id number] Date of Birth: 05/14/1975  [facility] CM/SW Contact  Purcell Mouton, RN Phone Number: 04/23/2019, 1:57 PM  Clinical Narrative:    Appointment 3/24 for PCP.  Pt may walk in for labs. 1. MD will need to call PCP for labs or 2. MD may place future labs orders. Pt is aware of this information and attending.    Expected Discharge Plan: Home/Self Care Barriers to Discharge: No Barriers Identified  Expected Discharge Plan and Services Expected Discharge Plan: Home/Self Care   Discharge Planning Services: CM Consult   Living arrangements for the past 2 months: Apartment                                       Social Determinants of Health (SDOH) Interventions    Readmission Risk Interventions No flowsheet data found.

## 2019-04-23 NOTE — Progress Notes (Signed)
Progress Note  Patient Name: Duane [name] Date of Encounter: 04/23/2019  Primary Cardiologist: Buford Dresser, MD   Subjective   Ready to go home Discussed elevated Cr and need for f/u BMET in a week   Inpatient Medications    Scheduled Meds: . amLODipine  10 mg Oral Daily  . atorvastatin  20 mg Oral q1800  . carvedilol  25 mg Oral BID WC  . DULoxetine  60 mg Oral Daily  . enoxaparin (LOVENOX) injection  80 mg Subcutaneous Q24H  . furosemide  80 mg Oral BID  . hydrALAZINE  100 mg Oral TID  . insulin aspart  0-15 Units Subcutaneous TID WC  . insulin aspart  0-5 Units Subcutaneous QHS  . insulin aspart  3 Units Subcutaneous TID WC  . insulin glargine  20 Units Subcutaneous BID  . metoCLOPramide  10 mg Oral TID AC  . pantoprazole  40 mg Oral Daily  . pregabalin  75 mg Oral BID  . sodium chloride flush  3 mL Intravenous Q12H   Continuous Infusions:  PRN Meds: acetaminophen **OR** acetaminophen   Vital Signs    Vitals:   04/22/19 2103 04/22/19 2316 04/23/19 0524 04/23/19 0531  BP: 118/70 140/71 (!) 147/89   Pulse: 88 (!) 53 87   Resp:  20    Temp: 99.2 F (37.3 C) 98.6 F (37 C) 98.6 F (37 C)   TempSrc: Oral Oral Oral   SpO2: 95% 94% 94%   Weight:    (!) 157.6 kg  Height:        Intake/Output Summary (Last 24 hours) at 04/23/2019 0950 Last data filed at 04/23/2019 0742 Gross per 24 hour  Intake 680 ml  Output 1725 ml  Net -1045 ml   Last 3 Weights 04/23/2019 04/22/2019 04/21/2019  Weight (lbs) 347 lb 6.4 oz 348 lb 8.8 oz 349 lb 13.9 oz  Weight (kg) 157.58 kg 158.1 kg 158.7 kg      Telemetry    Sinus rhythm 80-90s - Personally Reviewed  ECG    No new tracings - Personally Reviewed  Physical Exam   GEN: No acute distress.   Neck: No JVD Cardiac: RRR, no murmurs, rubs, or gallops.  Respiratory: Clear to auscultation bilaterally. GI: Soft, nontender, non-distended  MS: at least 1+ B LE edema Neuro:  Nonfocal  Psych: Normal affect   Labs      High Sensitivity Troponin:   Recent Labs  Lab 04/15/19 1748 04/16/19 0339  TROPONINIHS 25* 25*      Chemistry Recent Labs  Lab 04/22/19 0452 04/22/19 1026 04/23/19 0318  NA 139 137 137  K 4.4 4.2 4.8  CL 103 102 100  CO2 27 29 27   GLUCOSE 154* 226* 195*  BUN 38* 37* 40*  CREATININE 2.68* 2.70* 2.76*  CALCIUM 8.9 8.6* 8.7*  GFRNONAA 28* 28* 27*  GFRAA 33* 32* 31*  ANIONGAP 9 6 10      Hematology Recent Labs  Lab 04/17/19 0453 04/21/19 0418  WBC 5.0 6.0  RBC 4.03* 4.32  HGB 10.7* 11.3*  HCT 35.4* 37.7*  MCV 87.8 87.3  MCH 26.6 26.2  MCHC 30.2 30.0  RDW 13.7 13.5  PLT 253 324    BNP No results for input(s): BNP, PROBNP in the last 168 hours.   DDimer No results for input(s): DDIMER in the last 168 hours.   Radiology    No results found.  Cardiac Studies   Echo 04/18/19 1. Left ventricular ejection fraction, by estimation,  is 60 to 65%. The left ventricle has normal function. The left ventricle has no regional wall motion abnormalities. There is moderate concentric left ventricular hypertrophy. Left ventricular diastolic parameters are indeterminate.   2. Right ventricular systolic function is normal. The right ventricular size is normal.   3. Left atrial size was mildly dilated.   4. Right atrial size was mildly dilated.   5. The mitral valve is normal in structure and function. No evidence of mitral valve regurgitation. No evidence of mitral stenosis.   6. The aortic valve is normal in structure and function. Aortic valve regurgitation is not visualized. No aortic stenosis is present.   7. The inferior vena cava is normal in size with greater than 50% respiratory variability, suggesting right atrial pressure of 3 mmHg.   Patient Profile     44 y.o. male with a hx of type I diabetes complicated by neuropathy and gastroparesis, hypertension, stage 3 chronic kidney disease, recent Covid-19 infection with pneumonia requiring hospitalization, morbid obesity  who is being seen for the evaluation of new onset heart failure  Assessment & Plan    Acute on chronic diastolic heart failure-  On oral lasix 80 bid needs BMET early next week has f/u with Dr Harrell Gave arranged 2 weeks   Hypertension- now off clonidine continue hydralazine , coreg and amlodipine  DM type 1- poorly controlled A1c 11.2 per primary service   CKD stage 3 - Cr 2.68 today ok to d/c home will check next week    I have added some discharge instructions to AVS. I will arrange outpatient follow up with cardiology.   For questions or updates, please contact [facility] Please consult [email or web address] for contact info under      Signed, Jenkins Rouge, MD  04/23/2019, 9:50 AM

## 2019-04-24 ENCOUNTER — Telehealth: Payer: Self-pay

## 2019-04-24 ENCOUNTER — Telehealth: Payer: Medicare Other | Admitting: Cardiology

## 2019-04-24 NOTE — Telephone Encounter (Signed)
Hospital Follow up   Date of discharge and from where:03/11//2021, [facility]   Call placed to patient # [id number], message left with call back requested to this CM.  Patient has an appt scheduled in our office with Dr Margarita Rana  for Hospital F/u 16-May-2019

## 2019-04-27 LAB — METHYLMALONIC ACID, SERUM: Methylmalonic Acid, Quantitative: 435 nmol/L — ABNORMAL HIGH (ref 0–378)

## 2019-04-28 ENCOUNTER — Other Ambulatory Visit: Payer: Medicare Other

## 2019-04-29 ENCOUNTER — Other Ambulatory Visit: Payer: Self-pay

## 2019-04-30 ENCOUNTER — Ambulatory Visit (INDEPENDENT_AMBULATORY_CARE_PROVIDER_SITE_OTHER): Payer: Medicare Other | Admitting: Cardiology

## 2019-04-30 ENCOUNTER — Encounter: Payer: Self-pay | Admitting: Cardiology

## 2019-04-30 VITALS — BP 140/88 | HR 81 | Temp 97.3°F | Ht 76.0 in | Wt 362.8 lb

## 2019-04-30 DIAGNOSIS — Z79899 Other long term (current) drug therapy: Secondary | ICD-10-CM

## 2019-04-30 DIAGNOSIS — Z09 Encounter for follow-up examination after completed treatment for conditions other than malignant neoplasm: Secondary | ICD-10-CM | POA: Diagnosis not present

## 2019-04-30 DIAGNOSIS — E1122 Type 2 diabetes mellitus with diabetic chronic kidney disease: Secondary | ICD-10-CM | POA: Diagnosis not present

## 2019-04-30 DIAGNOSIS — Z7189 Other specified counseling: Secondary | ICD-10-CM

## 2019-04-30 DIAGNOSIS — R600 Localized edema: Secondary | ICD-10-CM

## 2019-04-30 DIAGNOSIS — Z8616 Personal history of COVID-19: Secondary | ICD-10-CM

## 2019-04-30 DIAGNOSIS — E1065 Type 1 diabetes mellitus with hyperglycemia: Secondary | ICD-10-CM

## 2019-04-30 DIAGNOSIS — E108 Type 1 diabetes mellitus with unspecified complications: Secondary | ICD-10-CM

## 2019-04-30 DIAGNOSIS — I10 Essential (primary) hypertension: Secondary | ICD-10-CM

## 2019-04-30 DIAGNOSIS — N183 Chronic kidney disease, stage 3 unspecified: Secondary | ICD-10-CM | POA: Diagnosis not present

## 2019-04-30 DIAGNOSIS — I5033 Acute on chronic diastolic (congestive) heart failure: Secondary | ICD-10-CM

## 2019-04-30 DIAGNOSIS — IMO0002 Reserved for concepts with insufficient information to code with codable children: Secondary | ICD-10-CM

## 2019-04-30 MED FILL — METOCLOPRAMIDE 10 MG TABLET: 10 | 10 days supply | Qty: 30 | Fill #4

## 2019-04-30 MED FILL — TERBINAFINE HCL 250 MG TAB: 250 | 30 days supply | Qty: 30 | Fill #2

## 2019-04-30 NOTE — Patient Instructions (Addendum)
Medication Instructions:  Your Physician recommend you continue on your current medication as directed.    *If you need a refill on your cardiac medications before your next appointment, please call your pharmacy*   Lab Work: None   Testing/Procedures: None   Follow-Up: At [facility], you and your health needs are our priority.  As part of our continuing mission to provide you with exceptional heart care, we have created designated Provider Care Teams.  These Care Teams include your primary Cardiologist (physician) and Advanced Practice Providers (APPs -  Physician Assistants and Nurse Practitioners) who all work together to provide you with the care you need, when you need it.  We recommend signing up for the patient portal called "MyChart".  Sign up information is provided on this After Visit Summary.  MyChart is used to connect with patients for Virtual Visits (Telemedicine).  Patients are able to view lab/test results, encounter notes, upcoming appointments, etc.  Non-urgent messages can be sent to your provider as well.   To learn more about what you can do with MyChart, go to [email or web address].    Your next appointment:   May 12, 2019 at 3:15  The format for your next appointment:   In Person  Provider:   Kerin Ransom, [place]  Your physician recommends that you schedule a follow-up appointment in 2 months with Dr. Harrell Gave   Do the following things EVERY DAY:  1) Weigh yourself EVERY morning after you go to the bathroom but before you eat or drink anything. Write this number down in a weight log/diary. If you gain 3 pounds overnight or 5 pounds in a week, call the office.  2) Take your medicines as prescribed. If you have concerns about your medications, please call us before you stop taking them.   3) Eat low salt foods--Limit salt (sodium) to 2000 mg per day. This will help prevent your body from holding onto fluid. Read food labels as many processed foods have  a lot of sodium, especially canned goods and prepackaged meats. If you would like some assistance choosing low sodium foods, we would be happy to set you up with a nutritionist.  4) Stay as active as you can everyday. Staying active will give you more energy and make your muscles stronger. Start with 5 minutes at a time and work your way up to 30 minutes a day. Break up your activities--do some in the morning and some in the afternoon. Start with 3 days per week and work your way up to 5 days as you can.  If you have chest pain, feel short of breath, dizzy, or lightheaded, STOP. If you don't feel better after a short rest, call 911. If you do feel better, call the office to let us know you have symptoms with exercise.  5) Limit all fluids for the day to less than 2 liters. Fluid includes all drinks, coffee, juice, ice chips, soup, jello, and all other liquids.

## 2019-04-30 NOTE — Progress Notes (Signed)
Cardiology Office Note:    Date:  04/30/2019   ID:  [name], DOB [birth year], MRN [id number]  PCP:  Charlott Rakes, MD  Cardiologist:  Buford Dresser, MD  Referring MD: Charlott Rakes, MD   CC: 7 day transition of care visit post hospital discharge  History of Present Illness:    Duane [name] is a 44 y.o. male with a hx of recent acute diastolic heart failure, type I diabetes complicated by neuropathy and gastroparesis, hypertension, stage 3 chronic kidney disease, recent Covid-19 infection with pneumonia requiring hospitalization, morbid obesity who is seen for 7 day transition of care follow up post hospital discharge. I reviewed his hospitalization course and discharge summary, and full medication reconciliation completed today.  Today: Taking furosemide 80 mg twice a day. Urinates, though less consistently than when he was in the hospital. Feels like he still has a lot of fluid on. He thinks he still has 25-30 lbs of fluid, especially in legs and arms. Discharge weight 347 lbs on 04/23/19. Admission weight around 370 lbs. Continues to have LE edema, especially on the left leg. Weight on our scale today is 362 lbs.  Sees nephrology next month to follow up on his CKD. Hasn't has kidney function checked since he left the hospital. Follows with Dr. Carolin Sicks at [facility].  He is frustrated by all of the fluctuations in his fluid levels. We discussed how the kidneys and heart are related. He was trying to protect his kidneys after his prior hospitalization by not taking his lasix until his follow up visit, but he now understands that this likely contributed to the fluid accumulation. He is very compliant with lasix since discharge. First lasix at 7-8 AM, next dose around dinner. Notes more urine output with the PM dose.  We also discussed his diabetes. He sees his endocrinologist tomorrow. They have discussed seeing a dietician, and I encouraged this.  We reviewed heart  failure education and recommendations again today. Discussed options for fluid management, see below.  Denies chest pain, shortness of breath at rest or with normal exertion. No PND, orthopnea. No syncope or palpitations.  Past Medical History:  Diagnosis Date  . Allergy   . Arthritis   . Chronic diarrhea   . Chronic kidney disease   . Depression   . Diabetes mellitus without complication [facility]) age 67   insulin requiring from the start  . Diabetic neuropathy ([place]) Dx 20110  . Diabetic proliferative retinopathy ([place]) 08/02/2017  . Gastritis   . Gastroparesis 2012   100% gastric retention on 08/2013 [facility]  . GERD (gastroesophageal reflux disease) Dx 2012  . Hx of adenomatous polyp of colon 10/05/2018  . Hyperlipidemia   . Hypertension Dx 2012  . Neuromuscular disorder ([place])   . Obesity     Past Surgical History:  Procedure Laterality Date  . CHOLECYSTECTOMY  2013  . ESOPHAGOGASTRODUODENOSCOPY N/A 06/11/2013   Procedure: ESOPHAGOGASTRODUODENOSCOPY (EGD);  Surgeon: [name], MD;  Location: [facility];  Service: Endoscopy;  Laterality: N/A;  . EYE SURGERY Left   . FLEXIBLE SIGMOIDOSCOPY N/A 06/11/2013   Procedure: FLEXIBLE SIGMOIDOSCOPY;  Surgeon: [name], MD;  Location: [facility];  Service: Endoscopy;  Laterality: N/A;  . KNEE SURGERY Left     Current Medications: Current Outpatient Medications on File Prior to Visit  Medication Sig  . albuterol (VENTOLIN HFA) 108 (90 Base) MCG/ACT inhaler Inhale 2 puffs into the lungs every 6 (six) hours as needed for wheezing or shortness of breath.  Marland Kitchen  amLODipine (NORVASC) 10 MG tablet Take 1 tablet (10 mg total) by mouth daily.  Marland Kitchen atorvastatin (LIPITOR) 20 MG tablet Take 1 tablet (20 mg total) by mouth daily.  . Blood Glucose Monitoring Suppl (TRUE METRIX METER) W/DEVICE KIT 1 each by Does not apply route 4 (four) times daily -  before meals and at bedtime.  . carvedilol (COREG) 25 MG tablet Take 1 tablet (25 mg total) by mouth 2  (two) times daily with a meal.  . DULoxetine (CYMBALTA) 60 MG capsule Take 1 capsule (60 mg total) by mouth daily.  . furosemide (LASIX) 40 MG tablet Take 2 tablets (80 mg total) by mouth 2 (two) times daily.  Marland Kitchen glucose blood (CONTOUR NEXT TEST) test strip Use as instructed  . glucose blood (CONTOUR NEXT TEST) test strip Use as instructed  . glucose blood (TRUE METRIX BLOOD GLUCOSE TEST) test strip 1 each by Other route 3 (three) times daily.  . hydrALAZINE (APRESOLINE) 100 MG tablet Take 1 tablet (100 mg total) by mouth 3 (three) times daily.  . Insulin Glargine (LANTUS SOLOSTAR) 100 UNIT/ML Solostar Pen Inject 40 Units into the skin 2 (two) times daily.  . insulin lispro (HUMALOG KWIKPEN) 100 UNIT/ML KwikPen INJECT 15 UNITS INTO THE SKIN 3 TIMES DAILY. (Patient taking differently: Inject 15 Units into the skin 3 (three) times daily. )  . Insulin Pen Needle 31G X 8 MM MISC Inject 1 each into the skin 4 (four) times daily. Check blood sugar TID and QHS  . methocarbamol (ROBAXIN) 500 MG tablet Take 1 tablet (500 mg total) by mouth every 8 (eight) hours as needed for muscle spasms.  . metoCLOPramide (REGLAN) 10 MG tablet Take a half hour before meals (Patient taking differently: Take 10 mg by mouth 3 (three) times daily before meals. )  . pantoprazole (PROTONIX) 40 MG tablet Take 1 tablet (40 mg total) by mouth daily.  . pregabalin (LYRICA) 75 MG capsule Take 1 capsule (75 mg total) by mouth 2 (two) times daily.  . promethazine (PHENERGAN) 25 MG tablet TAKE 1 TABLET BY MOUTH EVERY 8 HOURS AS NEEDED FOR NAUSEA OR VOMITING. (Patient taking differently: Take 25 mg by mouth every 8 (eight) hours as needed for nausea or vomiting. )  . rifaximin (XIFAXAN) 550 MG TABS tablet Take 1 tablet (550 mg total) by mouth daily as needed (Bacterial infrction in stomach).  . terbinafine (LAMISIL) 250 MG tablet Take 1 tablet (250 mg total) by mouth daily.  . TRUEPLUS LANCETS 28G MISC 1 each by Does not apply route 4  (four) times daily -  before meals and at bedtime.   No current facility-administered medications on file prior to visit.     Allergies:   Patient has no known allergies.   Social History   Tobacco Use  . Smoking status: Never Smoker  . Smokeless tobacco: Never Used  Substance Use Topics  . Alcohol use: No  . Drug use: No    Family History: family history includes Asthma in his cousin; Colon cancer in his cousin; Diabetes in his father, maternal grandmother, and paternal grandmother; Lung cancer in his paternal grandfather; Pancreatic cancer in his paternal grandfather. There is no history of Stomach cancer, Rectal cancer, or Esophageal cancer.  ROS:   Please see the history of present illness.  Additional pertinent ROS: Constitutional: Negative for chills, fever, night sweats, unintentional weight loss  HENT: Negative for ear pain and hearing loss.   Eyes: Negative for loss of vision and eye  pain.  Respiratory: Negative for cough, sputum, wheezing.   Cardiovascular: See HPI. Gastrointestinal: Negative for abdominal pain, melena, and hematochezia.  Genitourinary: Negative for dysuria and hematuria.  Musculoskeletal: Negative for falls and myalgias.  Skin: Negative for itching and rash.  Neurological: Negative for focal weakness, focal sensory changes and loss of consciousness.  Endo/Heme/Allergies: Does not bruise/bleed easily.     EKGs/Labs/Other Studies Reviewed:    The following studies were reviewed today: Echo 04/18/19 1. Left ventricular ejection fraction, by estimation, is 60 to 65%. The  left ventricle has normal function. The left ventricle has no regional  wall motion abnormalities. There is moderate concentric left ventricular  hypertrophy. Left ventricular  diastolic parameters are indeterminate.  2. Right ventricular systolic function is normal. The right ventricular  size is normal.  3. Left atrial size was mildly dilated.  4. Right atrial size was mildly  dilated.  5. The mitral valve is normal in structure and function. No evidence of  mitral valve regurgitation. No evidence of mitral stenosis.  6. The aortic valve is normal in structure and function. Aortic valve  regurgitation is not visualized. No aortic stenosis is present.  7. The inferior vena cava is normal in size with greater than 50%  respiratory variability, suggesting right atrial pressure of 3 mmHg.   EKG:  EKG is personally reviewed today.  The ekg ordered 04/15/19 demonstrates NSR with nonspecific ST changes.  Recent Labs: 04/09/2019: Pro B Natriuretic peptide (BNP) 153.0 04/15/2019: B Natriuretic Peptide 135.0 04/16/2019: ALT 25 04/21/2019: Hemoglobin 11.3; Platelets 324 04/23/2019: BUN 40; Creatinine, Ser 2.76; Magnesium 2.0; Potassium 4.8; Sodium 137  Recent Lipid Panel    Component Value Date/Time   CHOL 174 06/09/2018 1019   CHOL 138 05/17/2017 1026   TRIG 129.0 06/09/2018 1019   HDL 34.60 (L) 06/09/2018 1019   HDL 29 (L) 05/17/2017 1026   CHOLHDL 5 06/09/2018 1019   VLDL 25.8 06/09/2018 1019   LDLCALC 114 (H) 06/09/2018 1019   LDLCALC 65 05/17/2017 1026    Physical Exam:    VS:  BP 140/88   Pulse 81   Temp (!) 97.3 F (36.3 C)   Ht 6' 4"  (1.93 m)   Wt (!) 362 lb 12.8 oz (164.6 kg)   SpO2 97%   BMI 44.16 kg/m     Wt Readings from Last 3 Encounters:  04/30/19 (!) 362 lb 12.8 oz (164.6 kg)  04/23/19 (!) 347 lb 6.4 oz (157.6 kg)  04/09/19 (!) 373 lb 6.4 oz (169.4 kg)    GEN: Well nourished, well developed in no acute distress HEENT: Normal, moist mucous membranes NECK: No JVD visible on exame CARDIAC: regular rhythm, normal S1 and S2, no rubs or gallops. No murmurs. VASCULAR: Radial and DP pulses 2+ bilaterally. No carotid bruits RESPIRATORY:  Clear to auscultation without rales, wheezing or rhonchi  ABDOMEN: Soft, non-tender, non-distended MUSCULOSKELETAL:  Ambulates independently SKIN: Warm and dry. Diffuse pitting edema in bilateral lower  extremities, 1-2+ NEUROLOGIC:  Alert and oriented x 3. No focal neuro deficits noted. PSYCHIATRIC:  Normal affect    ASSESSMENT:    1. Hospital discharge follow-up   2. Medication management   3. Acute on chronic diastolic heart failure ([place])   4. Bilateral leg edema   5. Encounter for education about heart failure   6. Essential hypertension   7. Cardiac risk counseling   8. Counseling on health promotion and disease prevention   9. Personal history of covid-19   10. Type  I diabetes mellitus with complication, uncontrolled ([place])   11. CKD stage 3 secondary to diabetes [facility])    PLAN:    Transition of care visit for post hospital discharge follow up Acute on chronic diastolic heart failure, with rise in weight and lower extremity edema Chronic kidney disease, stage 3 (GFR 35) due to uncontrolled type I diabetes -recent admission for fluid accumulation. Based on weights and exam today, appears to be reaccumulating fluid -we discussed the difficult balance between kidneys and the heart. We will recheck a BMET today. If Cr stable, I would increase his lasix dose for several days to see if he has improved urine output and decrease in weight -if he does not make more urine/lose fluid, may need to transition to torsemide dosing -he will need close follow up given fluid reaccumulation. I will have him see one of our APPs within a short period to make sure his kidney function is stable and his fluid situation is improving. -we discussed the importance of diabetes and blood pressure control in managing heart disease and kidney disease as well today -extensive heart failure education given today. He is working on this. Struggling with dietary options for low carb and low salt. I encouraged him to see the dietician recommended by Dr. Dwyane Dee -seemed to be triggered by recent Covid infection, though EF preserved on echo -given symptoms to watch for, when to call for medical advice, when to seek  emergency care  Hypertension: above goal of <130/80 today -hopefully will increase short term diuresis with increase in lasix (Ideally TID for several days). This may lower blood pressure slightly, so will not make other changes at this time -continue amlodipine 10 mg daily, carvedilol 25 mg BID, hydralazine 100 mg TID -avoiding ACEi, ARB, spironolactone given CKD--defer to nephrology on this  Cardiac risk counseling and prevention recommendations: -recommend heart healthy/Mediterranean diet, with whole grains, fruits, vegetable, fish, lean meats, nuts, and olive oil. Limit salt. -recommend moderate walking, 3-5 times/week for 30-50 minutes each session. Aim for at least 150 minutes.week. Goal should be pace of 3 miles/hours, or walking 1.5 miles in 30 minutes -recommend avoidance of tobacco products. Avoid excess alcohol. -ASCVD risk score: The 10-year ASCVD risk score Mikey Bussing DC Brooke Bonito., et al., 2013) is: 13.5%   Values used to calculate the score:     Age: 51 years     Sex: Male     Is Non-Hispanic African American: Yes     Diabetic: Yes     Tobacco smoker: No     Systolic Blood Pressure: 941 mmHg     Is BP treated: Yes     HDL Cholesterol: 34.6 mg/dL     Total Cholesterol: 174 mg/dL   -continue statin given diabetes and elevated ASCVD risk  Plan for follow up:  Buford Dresser, MD, PhD [place]  [facility]    Medication Adjustments/Labs and Tests Ordered: Current medicines are reviewed at length with the patient today.  Concerns regarding medicines are outlined above.  Orders Placed This Encounter  Procedures  . Basic metabolic panel   No orders of the defined types were placed in this encounter.   Patient Instructions  Medication Instructions:  Your Physician recommend you continue on your current medication as directed.    *If you need a refill on your cardiac medications before your next appointment, please call your pharmacy*   Lab  Work: None   Testing/Procedures: None   Follow-Up: At [facility], you and your health needs  are our priority.  As part of our continuing mission to provide you with exceptional heart care, we have created designated Provider Care Teams.  These Care Teams include your primary Cardiologist (physician) and Advanced Practice Providers (APPs -  Physician Assistants and Nurse Practitioners) who all work together to provide you with the care you need, when you need it.  We recommend signing up for the patient portal called "MyChart".  Sign up information is provided on this After Visit Summary.  MyChart is used to connect with patients for Virtual Visits (Telemedicine).  Patients are able to view lab/test results, encounter notes, upcoming appointments, etc.  Non-urgent messages can be sent to your provider as well.   To learn more about what you can do with MyChart, go to [email or web address].    Your next appointment:   May 12, 2019 at 3:15  The format for your next appointment:   In Person  Provider:   Kerin Ransom, [place]  Your physician recommends that you schedule a follow-up appointment in 2 months with Dr. Harrell Gave   Do the following things EVERY DAY:  1) Weigh yourself EVERY morning after you go to the bathroom but before you eat or drink anything. Write this number down in a weight log/diary. If you gain 3 pounds overnight or 5 pounds in a week, call the office.  2) Take your medicines as prescribed. If you have concerns about your medications, please call us before you stop taking them.   3) Eat low salt foods--Limit salt (sodium) to 2000 mg per day. This will help prevent your body from holding onto fluid. Read food labels as many processed foods have a lot of sodium, especially canned goods and prepackaged meats. If you would like some assistance choosing low sodium foods, we would be happy to set you up with a nutritionist.  4) Stay as active as you can everyday.  Staying active will give you more energy and make your muscles stronger. Start with 5 minutes at a time and work your way up to 30 minutes a day. Break up your activities--do some in the morning and some in the afternoon. Start with 3 days per week and work your way up to 5 days as you can.  If you have chest pain, feel short of breath, dizzy, or lightheaded, STOP. If you don't feel better after a short rest, call 911. If you do feel better, call the office to let us know you have symptoms with exercise.  5) Limit all fluids for the day to less than 2 liters. Fluid includes all drinks, coffee, juice, ice chips, soup, jello, and all other liquids.    Signed, Buford Dresser, MD PhD 04/30/2019    [place]

## 2019-05-01 ENCOUNTER — Other Ambulatory Visit: Payer: Self-pay

## 2019-05-01 ENCOUNTER — Ambulatory Visit (INDEPENDENT_AMBULATORY_CARE_PROVIDER_SITE_OTHER): Payer: Medicare Other | Admitting: Endocrinology

## 2019-05-01 ENCOUNTER — Encounter: Payer: Self-pay | Admitting: Cardiology

## 2019-05-01 ENCOUNTER — Encounter: Payer: Self-pay | Admitting: Endocrinology

## 2019-05-01 VITALS — BP 124/70 | HR 86 | Ht 76.0 in | Wt 363.2 lb

## 2019-05-01 DIAGNOSIS — E1065 Type 1 diabetes mellitus with hyperglycemia: Secondary | ICD-10-CM

## 2019-05-01 DIAGNOSIS — E1021 Type 1 diabetes mellitus with diabetic nephropathy: Secondary | ICD-10-CM

## 2019-05-01 DIAGNOSIS — E1042 Type 1 diabetes mellitus with diabetic polyneuropathy: Secondary | ICD-10-CM | POA: Diagnosis not present

## 2019-05-01 DIAGNOSIS — N1832 Chronic kidney disease, stage 3b: Secondary | ICD-10-CM

## 2019-05-01 DIAGNOSIS — Z8616 Personal history of COVID-19: Secondary | ICD-10-CM | POA: Insufficient documentation

## 2019-05-01 DIAGNOSIS — E1022 Type 1 diabetes mellitus with diabetic chronic kidney disease: Secondary | ICD-10-CM

## 2019-05-01 LAB — BASIC METABOLIC PANEL
BUN/Creatinine Ratio: 12 (ref 9–20)
BUN: 30 mg/dL — ABNORMAL HIGH (ref 6–24)
CO2: 22 mmol/L (ref 20–29)
Calcium: 8.8 mg/dL (ref 8.7–10.2)
Chloride: 105 mmol/L (ref 96–106)
Creatinine, Ser: 2.54 mg/dL — ABNORMAL HIGH (ref 0.76–1.27)
GFR calc Af Amer: 35 mL/min/{1.73_m2} — ABNORMAL LOW (ref >59)
GFR calc non Af Amer: 30 mL/min/{1.73_m2} — ABNORMAL LOW (ref >59)
Glucose: 89 mg/dL (ref 65–99)
Potassium: 4.3 mmol/L (ref 3.5–5.2)
Sodium: 141 mmol/L (ref 134–144)

## 2019-05-01 NOTE — Progress Notes (Signed)
Patient ID: Duane [name], male   DOB: Jan 09, 1976, 44 y.o.   MRN: [id number]             Reason for Appointment : Follow-up for Type 1 Diabetes  History of Present Illness          Diagnosis: Type 1 diabetes mellitus, date of diagnosis: 04/2003         Previous history:  He was initially diagnosed with symptoms of high blood sugars needing hospitalization with a glucose of about 1100 Initially sent home on insulin and he thinks he was able to get off insulin for about a year after this Subsequently went back on insulin  Previous records are not available as he was under medical care in [place] Not clear what level of control he has had in the past  He was initially seen in consultation after his episode of ketoacidosis in 11/19  Recent history:  He has not been seen in follow-up since 05/2018  His A1c is most recently higher at 11.2 in 02/2019 Prior to that A1c was 9.3  INSULIN regimen is described as: Lantus 40 a.m. -40 p.m. daily Humalog with meals 15-20 units  CURRENT management, blood sugar patterns and problems identified:   He is now using the freestyle libre although recently still using true Metrix meter depending on availability  However he does not find a freestyle libre sticking too well and still has not started using the Contour meter  He is still taking the same insulin dose as last year  Appears to be more insulin resistant  He said that his diet has been poorly plan and may still eat some fast food biscuits  This is despite seeing the dietitian in the hospital when advised about improving his diet because of his renal dysfunction and heart failure  The last few days with his freestyle libre not being active with his home meter reads between 209 and 447 with highest readings late tonight or early morning  Especially recently with his CHF he has not been active  Glucose monitoring:  done generally 3 times a day         Glucometer: true Metrix      CONTINUOUS GLUCOSE MONITORING RECORD INTERPRETATION    Dates of Recording: 2/18 through 04/15/2019  Sensor description: Elenor Legato  Results statistics:   CGM use % of time  60  Average and SD  207  Time in range        23%  % Time Above 180  54  % Time above 250  21  % Time Below target 2    PRE-MEAL Fasting Lunch Dinner Bedtime Overall  Glucose range:       Mean/median:  196  231  167  221    POST-MEAL PC Breakfast PC Lunch PC Dinner  Glucose range:     Mean/median:  218  229  181    Glycemic patterns summary: Blood sugars are above target on an average throughout the day except around 180 before dinnertime Highest blood sugars are early part of the night and some midday Rarely blood sugar may be low normal or low at 6 PM Freestyle data recordings intermittent on most days  Hyperglycemic episodes are occurring frequently after evening meal, sometimes consistent overnight is also throughout the day and occasionally rebounding from low normal or low sugars.   Hypoglycemic episodes occurred rarely around 4-6 PM and once around 8 AM  Overnight periods: Blood sugars are fairly consistently high through the  night with moderate variability especially after 11 PM to 3 AM  Preprandial periods: Blood sugars are fairly consistently around 200 at breakfast and lunch and relatively low at dinnertime  Postprandial periods:   Postprandial spikes are not consistently present except periodically in the evenings but there is not complete  PREVIOUS readings:   PRE-MEAL Fasting Lunch  early evening Bedtime Overall  Glucose range:  134   161-204  238-328   Mean/median:     ?   POST-MEAL PC Breakfast PC Lunch PC Dinner  Glucose range: ?  211, 228    Mean/median:       Self-care: The diet that the patient has been following is: None  Meals: 2-3 meals per day    Breakfast is sometimes skipped, otherwise eggs or breakfast sandwiches, sometimes fast food.           Dietician  consultation: Most recent: In the hospital        CDE consultation: None   Diabetes labs:   Lab Results  Component Value Date   HGBA1C 11.2 (H) 02/24/2019   HGBA1C 9.3 (A) 10/21/2018   HGBA1C 8.5 (A) 07/14/2018   Lab Results  Component Value Date   MICROALBUR 419.3 (H) 06/09/2018   LDLCALC 114 (H) 06/09/2018   CREATININE 2.54 (H) 04/30/2019    Allergies as of 05/01/2019   No Known Allergies     Medication List       Accurate as of May 01, 2019 10:50 AM. If you have any questions, ask your nurse or doctor.        albuterol 108 (90 Base) MCG/ACT inhaler Commonly known as: VENTOLIN HFA Inhale 2 puffs into the lungs every 6 (six) hours as needed for wheezing or shortness of breath.   amLODipine 10 MG tablet Commonly known as: NORVASC Take 1 tablet (10 mg total) by mouth daily.   atorvastatin 20 MG tablet Commonly known as: LIPITOR Take 1 tablet (20 mg total) by mouth daily.   carvedilol 25 MG tablet Commonly known as: COREG Take 1 tablet (25 mg total) by mouth 2 (two) times daily with a meal.   DULoxetine 60 MG capsule Commonly known as: Cymbalta Take 1 capsule (60 mg total) by mouth daily.   furosemide 40 MG tablet Commonly known as: LASIX Take 2 tablets (80 mg total) by mouth 2 (two) times daily.   glucose blood test strip Commonly known as: True Metrix Blood Glucose Test 1 each by Other route 3 (three) times daily.   glucose blood test strip Commonly known as: Contour Next Test Use as instructed   glucose blood test strip Commonly known as: Contour Next Test Use as instructed   hydrALAZINE 100 MG tablet Commonly known as: APRESOLINE Take 1 tablet (100 mg total) by mouth 3 (three) times daily.   insulin lispro 100 UNIT/ML KwikPen Commonly known as: HumaLOG KwikPen INJECT 15 UNITS INTO THE SKIN 3 TIMES DAILY. What changed:   how much to take  how to take this  when to take this  additional instructions   Insulin Pen Needle 31G X 8 MM  Misc Inject 1 each into the skin 4 (four) times daily. Check blood sugar TID and QHS   Lantus SoloStar 100 UNIT/ML Solostar Pen Generic drug: insulin glargine Inject 40 Units into the skin 2 (two) times daily.   methocarbamol 500 MG tablet Commonly known as: Robaxin Take 1 tablet (500 mg total) by mouth every 8 (eight) hours as needed for muscle spasms.  metoCLOPramide 10 MG tablet Commonly known as: REGLAN Take a half hour before meals What changed:   how much to take  how to take this  when to take this  additional instructions   pantoprazole 40 MG tablet Commonly known as: PROTONIX Take 1 tablet (40 mg total) by mouth daily.   pregabalin 75 MG capsule Commonly known as: Lyrica Take 1 capsule (75 mg total) by mouth 2 (two) times daily.   promethazine 25 MG tablet Commonly known as: PHENERGAN TAKE 1 TABLET BY MOUTH EVERY 8 HOURS AS NEEDED FOR NAUSEA OR VOMITING. What changed:   how much to take  how to take this  when to take this  reasons to take this  additional instructions   rifaximin 550 MG Tabs tablet Commonly known as: Xifaxan Take 1 tablet (550 mg total) by mouth daily as needed (Bacterial infrction in stomach).   terbinafine 250 MG tablet Commonly known as: LamISIL Take 1 tablet (250 mg total) by mouth daily.   True Metrix Meter w/Device Kit 1 each by Does not apply route 4 (four) times daily -  before meals and at bedtime.   TRUEplus Lancets 28G Misc 1 each by Does not apply route 4 (four) times daily -  before meals and at bedtime.       Allergies: No Known Allergies  Past Medical History:  Diagnosis Date  . Allergy   . Arthritis   . Chronic diarrhea   . Chronic kidney disease   . Depression   . Diabetes mellitus without complication [facility]) age 74   insulin requiring from the start  . Diabetic neuropathy ([place]) Dx 20110  . Diabetic proliferative retinopathy ([place]) 08/02/2017  . Gastritis   . Gastroparesis 2012   100% gastric  retention on 08/2013 [facility]  . GERD (gastroesophageal reflux disease) Dx 2012  . Hx of adenomatous polyp of colon 10/05/2018  . Hyperlipidemia   . Hypertension Dx 2012  . Neuromuscular disorder ([place])   . Obesity     Past Surgical History:  Procedure Laterality Date  . CHOLECYSTECTOMY  2013  . ESOPHAGOGASTRODUODENOSCOPY N/A 06/11/2013   Procedure: ESOPHAGOGASTRODUODENOSCOPY (EGD);  Surgeon: [name], MD;  Location: [facility];  Service: Endoscopy;  Laterality: N/A;  . EYE SURGERY Left   . FLEXIBLE SIGMOIDOSCOPY N/A 06/11/2013   Procedure: FLEXIBLE SIGMOIDOSCOPY;  Surgeon: [name], MD;  Location: [facility];  Service: Endoscopy;  Laterality: N/A;  . KNEE SURGERY Left     Family History  Problem Relation Age of Onset  . Diabetes Father   . Diabetes Maternal Grandmother   . Diabetes Paternal Grandmother   . Colon cancer Cousin   . Asthma Cousin   . Pancreatic cancer Paternal Grandfather   . Lung cancer Paternal Grandfather        mets from pancreas  . Stomach cancer Neg Hx   . Rectal cancer Neg Hx   . Esophageal cancer Neg Hx     Social History:  reports that he has never smoked. He has never used smokeless tobacco. He reports that he does not drink alcohol or use drugs.     Review of Systems   DIABETES complications: Gastroparesis and recurrent diarrhea, severe peripheral neuropathy with sensory loss Retinopathy, proliferative Erectile dysfunction Diabetic nephropathy with proteinuria and mild renal insufficiency       HYPERLIPIDEMIA:  Treated with atorvastatin and followed by PCP, currently taking atorvastatin , 20 mg with no recent labs available  Lab Results  Component Value  Date   CHOL 174 06/09/2018   HDL 34.60 (L) 06/09/2018   LDLCALC 114 (H) 06/09/2018   TRIG 129.0 06/09/2018   CHOLHDL 5 06/09/2018   HYPERTENSION recently well controlled, on amlodipine, hydralazine and Lasix currently  BP Readings from Last 3 Encounters:  05/01/19 124/70    04/30/19 140/88  04/23/19 120/75     CRF: Followed by nephrologist and renal function worse compared to last year  Lab Results  Component Value Date   CREATININE 2.54 (H) 04/30/2019   CREATININE 2.76 (H) 04/23/2019   CREATININE 2.70 (H) 04/22/2019    NEUROPATHY: Currently taking Lyrica instead of gabapentin  Also now taking duloxetine Has had chronic pain  LABS:  Office Visit on 04/30/2019  Component Date Value Ref Range Status  . Glucose 04/30/2019 89  65 - 99 mg/dL Final  . BUN 04/30/2019 30* 6 - 24 mg/dL Final  . Creatinine, Ser 04/30/2019 2.54* 0.76 - 1.27 mg/dL Final  . GFR calc non Af Amer 04/30/2019 30* >59 mL/min/1.73 Final  . GFR calc Af Amer 04/30/2019 35* >59 mL/min/1.73 Final  . BUN/Creatinine Ratio 04/30/2019 12  9 - 20 Final  . Sodium 04/30/2019 141  134 - 144 mmol/L Final  . Potassium 04/30/2019 4.3  3.5 - 5.2 mmol/L Final  . Chloride 04/30/2019 105  96 - 106 mmol/L Final  . CO2 04/30/2019 22  20 - 29 mmol/L Final  . Calcium 04/30/2019 8.8  8.7 - 10.2 mg/dL Final    Physical Examination:  BP 124/70   Pulse 86   Ht 6' 4"  (1.93 m)   Wt (!) 363 lb 3.2 oz (164.7 kg)   SpO2 96%   BMI 44.21 kg/m       ASSESSMENT:  Diabetes type 1, long-standing with morbid obesity  He has had  persistent poor control associated with multiple complications   T2W is last 11.2  On basal bolus insulin but he is relatively insulin resistant with taking at least 80 units of basal insulin and up to 60 units of bolus insulin Diet can be much better also Still has difficulty losing weight He does need to have his basal insulin increased especially in the evenings with most readings overnight well over 200 Also readings are higher after dinner   Because of his compliance and lack of adequate follow-up he is not a good candidate for insulin pump as yet  HYPERTENSION: Recently controlled, to be followed by PCP and nephrologist  NEUROPATHY: Relatively well controlled  with Lyrica, also on duloxetine now  PLAN:    Check blood sugars at least 4 times a day when using the freestyle libre sensor  Discussed options for increasing the dizziness or using an arm band for the libre  May also consider version 2 or Dexcom  Increase evening Lantus by 8 units for now and continue to go up to morning sugars are below 150 at least  For most meals will need 25 to 30 units of Humalog at dinner coverage  Avoid high-fat foods especially at breakfast  Discussed needing to avoid fast food because of high sodium content  Consultation with dietitian  Resume exercise when able to  More regular follow-up recommended    Patient Instructions  Lantus 48 pm lantus and go up to keep am sugar < 150  Humalog at supper 25-30 units   Egg McMuffin in am   Skin Tac wipe or Armband thru [employer] 05/01/2019, 10:50  AM   Note: This note was prepared with Estate agent. Any transcriptional errors that result from this process are unintentional.

## 2019-05-01 NOTE — Patient Instructions (Addendum)
Lantus 48 pm lantus and go up to keep am sugar < 150  Humalog at supper 25-30 units   Egg McMuffin in am   Skin Tac wipe or Armband thru [employer]

## 2019-05-01 NOTE — Progress Notes (Signed)
Kidney function is stable. Please ask him to take his 80 mg furosemide three times daily (morning, mid day, and late afternoon) for three days to see if this helps with his weight/fluid going down. Please have him call us or send a message early next week to see if he is improving. Thanks.

## 2019-05-04 MED FILL — ALBUTEROL SULFATE HFA 108 (: 108 (90 BAS | 25 days supply | Qty: 18 | Fill #1

## 2019-05-05 ENCOUNTER — Other Ambulatory Visit: Payer: Self-pay | Admitting: *Deleted

## 2019-05-05 ENCOUNTER — Ambulatory Visit: Payer: Medicare Other | Admitting: Adult Health

## 2019-05-06 ENCOUNTER — Other Ambulatory Visit: Payer: Self-pay

## 2019-05-06 ENCOUNTER — Telehealth: Payer: Self-pay | Admitting: Family Medicine

## 2019-05-06 ENCOUNTER — Ambulatory Visit: Payer: Medicare Other | Attending: Family Medicine

## 2019-05-06 DIAGNOSIS — R0602 Shortness of breath: Secondary | ICD-10-CM | POA: Diagnosis not present

## 2019-05-06 DIAGNOSIS — R4020 Unspecified coma: Secondary | ICD-10-CM | POA: Diagnosis not present

## 2019-05-06 DIAGNOSIS — R404 Transient alteration of awareness: Secondary | ICD-10-CM | POA: Diagnosis not present

## 2019-05-06 NOTE — Telephone Encounter (Signed)
Received call from EMS that patient was found dead in his apartment.  Time of death approximately 7-9 pm 05/16/2019 per EMS.  Patient had called his mom yesterday evening stating he was short of breath and would drive himself to the ED.

## 2019-05-12 ENCOUNTER — Ambulatory Visit: Payer: Medicare Other | Admitting: Cardiology

## 2019-05-14 DIAGNOSIS — 419620001 Death: Secondary | SNOMED CT | POA: Diagnosis not present

## 2019-05-14 NOTE — Patient Outreach (Signed)
Telephone outreach for a RED FLAG on EMMI discharge call.  No answer, able to leave a message and requested a return call.  Duane [name]. Myrtie Neither, MSN, [facility] Gerontological Nurse Practitioner [facility] Care Management [phone]

## 2019-05-14 DEATH — deceased

## 2019-05-18 ENCOUNTER — Telehealth: Payer: Self-pay

## 2019-05-18 NOTE — Telephone Encounter (Signed)
[employer] was called and informed that death certificate is ready for pick up. The orignal copy has been placed upfront for pick up and copy has been given to [employer].

## 2019-05-21 ENCOUNTER — Telehealth: Payer: Self-pay | Admitting: Family Medicine

## 2019-05-21 NOTE — Telephone Encounter (Signed)
Daniel Nones, [place] Drivers license number [id number], came to the office and picked up the patients death certificate.

## 2019-05-29 ENCOUNTER — Ambulatory Visit: Payer: Medicare Other | Admitting: Dietician

## 2019-06-15 ENCOUNTER — Other Ambulatory Visit: Payer: Medicare Other

## 2019-06-18 ENCOUNTER — Ambulatory Visit: Payer: Medicare Other | Admitting: Endocrinology

## 2019-06-19 ENCOUNTER — Other Ambulatory Visit (HOSPITAL_COMMUNITY): Payer: Medicare Other

## 2019-06-23 ENCOUNTER — Ambulatory Visit: Payer: Medicare Other | Admitting: Adult Health

## 2019-07-01 ENCOUNTER — Ambulatory Visit: Payer: Medicare Other | Admitting: Cardiology

## 2019-07-20 ENCOUNTER — Ambulatory Visit: Payer: Medicare Other | Admitting: Podiatry
